# Patient Record
Sex: Female | Born: 1950
Health system: Southern US, Community
[De-identification: ages and names within clinical notes are randomized; demographics above are authoritative.]

## PROBLEM LIST (undated history)

## (undated) DIAGNOSIS — I82403 Acute embolism and thrombosis of unspecified deep veins of lower extremity, bilateral: Secondary | ICD-10-CM

## (undated) DIAGNOSIS — I82409 Acute embolism and thrombosis of unspecified deep veins of unspecified lower extremity: Secondary | ICD-10-CM

## (undated) DIAGNOSIS — M1990 Unspecified osteoarthritis, unspecified site: Secondary | ICD-10-CM

## (undated) DIAGNOSIS — R569 Unspecified convulsions: Secondary | ICD-10-CM

## (undated) DIAGNOSIS — G4733 Obstructive sleep apnea (adult) (pediatric): Secondary | ICD-10-CM

## (undated) DIAGNOSIS — F32A Depression, unspecified: Secondary | ICD-10-CM

## (undated) DIAGNOSIS — G43909 Migraine, unspecified, not intractable, without status migrainosus: Secondary | ICD-10-CM

## (undated) DIAGNOSIS — D649 Anemia, unspecified: Secondary | ICD-10-CM

## (undated) DIAGNOSIS — E669 Obesity, unspecified: Secondary | ICD-10-CM

## (undated) DIAGNOSIS — I2510 Atherosclerotic heart disease of native coronary artery without angina pectoris: Secondary | ICD-10-CM

## (undated) DIAGNOSIS — K219 Gastro-esophageal reflux disease without esophagitis: Secondary | ICD-10-CM

## (undated) DIAGNOSIS — I219 Acute myocardial infarction, unspecified: Secondary | ICD-10-CM

## (undated) DIAGNOSIS — M549 Dorsalgia, unspecified: Secondary | ICD-10-CM

## (undated) DIAGNOSIS — M546 Pain in thoracic spine: Secondary | ICD-10-CM

## (undated) DIAGNOSIS — I509 Heart failure, unspecified: Secondary | ICD-10-CM

## (undated) DIAGNOSIS — E785 Hyperlipidemia, unspecified: Secondary | ICD-10-CM

## (undated) DIAGNOSIS — F1010 Alcohol abuse, uncomplicated: Secondary | ICD-10-CM

## (undated) DIAGNOSIS — I10 Essential (primary) hypertension: Secondary | ICD-10-CM

## (undated) DIAGNOSIS — G8929 Other chronic pain: Secondary | ICD-10-CM

## (undated) DIAGNOSIS — J45901 Unspecified asthma with (acute) exacerbation: Secondary | ICD-10-CM

## (undated) DIAGNOSIS — N183 Chronic kidney disease, stage 3 (moderate): Secondary | ICD-10-CM

## (undated) DIAGNOSIS — F329 Major depressive disorder, single episode, unspecified: Secondary | ICD-10-CM

## (undated) HISTORY — PX: COLONOSCOPY: SHX174

## (undated) HISTORY — DX: Essential (primary) hypertension: I10

## (undated) HISTORY — PX: CARDIAC CATHETERIZATION: SHX172

## (undated) HISTORY — DX: Unspecified osteoarthritis, unspecified site: M19.90

## (undated) HISTORY — PX: TUBAL LIGATION: SHX77

## (undated) HISTORY — DX: Major depressive disorder, single episode, unspecified: F32.9

## (undated) HISTORY — DX: Atherosclerotic heart disease of native coronary artery without angina pectoris: I25.10

## (undated) HISTORY — DX: Obesity, unspecified: E66.9

## (undated) HISTORY — DX: Obstructive sleep apnea (adult) (pediatric): G47.33

## (undated) HISTORY — DX: Hyperlipidemia, unspecified: E78.5

## (undated) HISTORY — DX: Depression, unspecified: F32.A

## (undated) HISTORY — PX: KNEE ARTHROSCOPY: SUR90

## (undated) HISTORY — DX: Acute embolism and thrombosis of unspecified deep veins of lower extremity, bilateral: I82.403

## (undated) HISTORY — PX: ABDOMINAL HYSTERECTOMY: SHX81

## (undated) HISTORY — PX: JOINT REPLACEMENT: SHX530

---

## 2000-05-20 ENCOUNTER — Ambulatory Visit (HOSPITAL_COMMUNITY): Admission: RE | Admit: 2000-05-20 | Discharge: 2000-05-20 | Payer: Self-pay | Admitting: Family Medicine

## 2000-11-25 ENCOUNTER — Emergency Department (HOSPITAL_COMMUNITY): Admission: EM | Admit: 2000-11-25 | Discharge: 2000-11-25 | Payer: Self-pay | Admitting: Emergency Medicine

## 2001-07-16 ENCOUNTER — Inpatient Hospital Stay (HOSPITAL_COMMUNITY): Admission: EM | Admit: 2001-07-16 | Discharge: 2001-07-18 | Payer: Self-pay | Admitting: Cardiology

## 2001-07-16 ENCOUNTER — Encounter: Payer: Self-pay | Admitting: Emergency Medicine

## 2001-10-12 ENCOUNTER — Inpatient Hospital Stay (HOSPITAL_COMMUNITY): Admission: EM | Admit: 2001-10-12 | Discharge: 2001-10-15 | Payer: Self-pay | Admitting: Emergency Medicine

## 2001-10-12 ENCOUNTER — Encounter: Payer: Self-pay | Admitting: *Deleted

## 2001-10-12 ENCOUNTER — Encounter: Payer: Self-pay | Admitting: Cardiovascular Disease

## 2001-11-11 ENCOUNTER — Encounter: Payer: Self-pay | Admitting: Emergency Medicine

## 2001-11-11 ENCOUNTER — Inpatient Hospital Stay (HOSPITAL_COMMUNITY): Admission: EM | Admit: 2001-11-11 | Discharge: 2001-11-16 | Payer: Self-pay | Admitting: Emergency Medicine

## 2001-11-14 ENCOUNTER — Encounter: Payer: Self-pay | Admitting: Cardiology

## 2001-12-26 ENCOUNTER — Inpatient Hospital Stay (HOSPITAL_COMMUNITY): Admission: EM | Admit: 2001-12-26 | Discharge: 2001-12-28 | Payer: Self-pay | Admitting: Emergency Medicine

## 2001-12-26 ENCOUNTER — Encounter: Payer: Self-pay | Admitting: Emergency Medicine

## 2002-03-07 ENCOUNTER — Encounter: Payer: Self-pay | Admitting: Emergency Medicine

## 2002-03-08 ENCOUNTER — Inpatient Hospital Stay (HOSPITAL_COMMUNITY): Admission: EM | Admit: 2002-03-08 | Discharge: 2002-03-12 | Payer: Self-pay

## 2002-07-24 ENCOUNTER — Inpatient Hospital Stay (HOSPITAL_COMMUNITY): Admission: EM | Admit: 2002-07-24 | Discharge: 2002-07-28 | Payer: Self-pay | Admitting: Emergency Medicine

## 2002-07-24 ENCOUNTER — Encounter: Payer: Self-pay | Admitting: Emergency Medicine

## 2002-07-25 ENCOUNTER — Encounter: Payer: Self-pay | Admitting: Interventional Cardiology

## 2002-11-02 ENCOUNTER — Emergency Department (HOSPITAL_COMMUNITY): Admission: EM | Admit: 2002-11-02 | Discharge: 2002-11-03 | Payer: Self-pay | Admitting: Emergency Medicine

## 2002-11-03 ENCOUNTER — Encounter: Payer: Self-pay | Admitting: Emergency Medicine

## 2002-11-29 ENCOUNTER — Emergency Department (HOSPITAL_COMMUNITY): Admission: EM | Admit: 2002-11-29 | Discharge: 2002-11-30 | Payer: Self-pay | Admitting: Emergency Medicine

## 2003-02-28 ENCOUNTER — Emergency Department (HOSPITAL_COMMUNITY): Admission: EM | Admit: 2003-02-28 | Discharge: 2003-03-01 | Payer: Self-pay | Admitting: *Deleted

## 2003-03-31 ENCOUNTER — Emergency Department (HOSPITAL_COMMUNITY): Admission: EM | Admit: 2003-03-31 | Discharge: 2003-04-01 | Payer: Self-pay | Admitting: Emergency Medicine

## 2003-07-16 ENCOUNTER — Encounter: Admission: RE | Admit: 2003-07-16 | Discharge: 2003-07-16 | Payer: Self-pay | Admitting: Internal Medicine

## 2003-07-18 ENCOUNTER — Emergency Department (HOSPITAL_COMMUNITY): Admission: EM | Admit: 2003-07-18 | Discharge: 2003-07-19 | Payer: Self-pay | Admitting: Emergency Medicine

## 2003-08-04 ENCOUNTER — Encounter: Admission: RE | Admit: 2003-08-04 | Discharge: 2003-08-04 | Payer: Self-pay | Admitting: Internal Medicine

## 2003-08-12 ENCOUNTER — Encounter: Admission: RE | Admit: 2003-08-12 | Discharge: 2003-08-12 | Payer: Self-pay | Admitting: Internal Medicine

## 2003-09-12 ENCOUNTER — Emergency Department (HOSPITAL_COMMUNITY): Admission: EM | Admit: 2003-09-12 | Discharge: 2003-09-12 | Payer: Self-pay | Admitting: Emergency Medicine

## 2003-09-27 ENCOUNTER — Encounter: Admission: RE | Admit: 2003-09-27 | Discharge: 2003-09-27 | Payer: Self-pay | Admitting: Internal Medicine

## 2003-11-17 ENCOUNTER — Encounter (INDEPENDENT_AMBULATORY_CARE_PROVIDER_SITE_OTHER): Payer: Self-pay | Admitting: *Deleted

## 2003-11-17 ENCOUNTER — Ambulatory Visit: Payer: Self-pay | Admitting: Internal Medicine

## 2004-01-05 ENCOUNTER — Emergency Department (HOSPITAL_COMMUNITY): Admission: EM | Admit: 2004-01-05 | Discharge: 2004-01-05 | Payer: Self-pay | Admitting: Emergency Medicine

## 2004-05-04 ENCOUNTER — Emergency Department (HOSPITAL_COMMUNITY): Admission: EM | Admit: 2004-05-04 | Discharge: 2004-05-04 | Payer: Self-pay | Admitting: Emergency Medicine

## 2004-06-14 ENCOUNTER — Emergency Department (HOSPITAL_COMMUNITY): Admission: EM | Admit: 2004-06-14 | Discharge: 2004-06-14 | Payer: Self-pay | Admitting: Emergency Medicine

## 2004-10-19 ENCOUNTER — Ambulatory Visit (HOSPITAL_COMMUNITY): Admission: RE | Admit: 2004-10-19 | Discharge: 2004-10-19 | Payer: Self-pay | Admitting: Family Medicine

## 2004-10-24 ENCOUNTER — Ambulatory Visit: Payer: Self-pay | Admitting: Internal Medicine

## 2004-11-15 ENCOUNTER — Inpatient Hospital Stay (HOSPITAL_COMMUNITY): Admission: EM | Admit: 2004-11-15 | Discharge: 2004-11-18 | Payer: Self-pay | Admitting: Emergency Medicine

## 2004-11-15 ENCOUNTER — Ambulatory Visit: Payer: Self-pay | Admitting: Hospitalist

## 2004-11-15 ENCOUNTER — Ambulatory Visit: Payer: Self-pay | Admitting: Internal Medicine

## 2004-11-24 ENCOUNTER — Ambulatory Visit: Payer: Self-pay | Admitting: Internal Medicine

## 2005-02-08 ENCOUNTER — Ambulatory Visit: Payer: Self-pay | Admitting: Internal Medicine

## 2005-02-22 ENCOUNTER — Ambulatory Visit: Payer: Self-pay | Admitting: Internal Medicine

## 2005-03-05 ENCOUNTER — Ambulatory Visit: Payer: Self-pay | Admitting: Internal Medicine

## 2005-05-04 ENCOUNTER — Ambulatory Visit: Payer: Self-pay | Admitting: Internal Medicine

## 2005-05-18 ENCOUNTER — Ambulatory Visit: Payer: Self-pay | Admitting: Internal Medicine

## 2005-05-23 ENCOUNTER — Ambulatory Visit (HOSPITAL_BASED_OUTPATIENT_CLINIC_OR_DEPARTMENT_OTHER): Admission: RE | Admit: 2005-05-23 | Discharge: 2005-05-23 | Payer: Self-pay | Admitting: *Deleted

## 2005-05-23 ENCOUNTER — Encounter: Payer: Self-pay | Admitting: Internal Medicine

## 2005-05-27 ENCOUNTER — Ambulatory Visit: Payer: Self-pay | Admitting: Internal Medicine

## 2005-10-02 ENCOUNTER — Ambulatory Visit: Payer: Self-pay | Admitting: Hospitalist

## 2005-11-23 ENCOUNTER — Emergency Department (HOSPITAL_COMMUNITY): Admission: EM | Admit: 2005-11-23 | Discharge: 2005-11-23 | Payer: Self-pay | Admitting: Family Medicine

## 2005-11-28 ENCOUNTER — Ambulatory Visit: Payer: Self-pay | Admitting: Internal Medicine

## 2005-12-13 ENCOUNTER — Ambulatory Visit: Payer: Self-pay | Admitting: Internal Medicine

## 2005-12-13 ENCOUNTER — Encounter (INDEPENDENT_AMBULATORY_CARE_PROVIDER_SITE_OTHER): Payer: Self-pay | Admitting: *Deleted

## 2005-12-13 ENCOUNTER — Encounter (INDEPENDENT_AMBULATORY_CARE_PROVIDER_SITE_OTHER): Payer: Self-pay | Admitting: Internal Medicine

## 2005-12-13 DIAGNOSIS — G4733 Obstructive sleep apnea (adult) (pediatric): Secondary | ICD-10-CM

## 2005-12-13 DIAGNOSIS — R7303 Prediabetes: Secondary | ICD-10-CM

## 2005-12-13 DIAGNOSIS — F1011 Alcohol abuse, in remission: Secondary | ICD-10-CM | POA: Insufficient documentation

## 2005-12-13 DIAGNOSIS — I10 Essential (primary) hypertension: Secondary | ICD-10-CM | POA: Insufficient documentation

## 2005-12-13 DIAGNOSIS — F1010 Alcohol abuse, uncomplicated: Secondary | ICD-10-CM

## 2005-12-13 HISTORY — DX: Alcohol abuse, in remission: F10.11

## 2005-12-13 HISTORY — DX: Alcohol abuse, uncomplicated: F10.10

## 2005-12-13 LAB — CONVERTED CEMR LAB
BUN: 18 mg/dL (ref 6–23)
Calcium: 8.8 mg/dL (ref 8.4–10.5)
Glucose, Bld: 101 mg/dL — ABNORMAL HIGH (ref 70–99)
Magnesium: 1.7 mg/dL (ref 1.5–2.5)

## 2005-12-18 ENCOUNTER — Ambulatory Visit: Payer: Self-pay | Admitting: Gastroenterology

## 2005-12-31 ENCOUNTER — Encounter (INDEPENDENT_AMBULATORY_CARE_PROVIDER_SITE_OTHER): Payer: Self-pay | Admitting: *Deleted

## 2005-12-31 ENCOUNTER — Ambulatory Visit (HOSPITAL_COMMUNITY): Admission: RE | Admit: 2005-12-31 | Discharge: 2005-12-31 | Payer: Self-pay | Admitting: Family Medicine

## 2006-01-05 ENCOUNTER — Emergency Department (HOSPITAL_COMMUNITY): Admission: EM | Admit: 2006-01-05 | Discharge: 2006-01-05 | Payer: Self-pay | Admitting: Emergency Medicine

## 2006-02-05 ENCOUNTER — Ambulatory Visit: Payer: Self-pay | Admitting: Internal Medicine

## 2006-02-05 ENCOUNTER — Encounter (INDEPENDENT_AMBULATORY_CARE_PROVIDER_SITE_OTHER): Payer: Self-pay | Admitting: *Deleted

## 2006-02-05 DIAGNOSIS — I2510 Atherosclerotic heart disease of native coronary artery without angina pectoris: Secondary | ICD-10-CM | POA: Insufficient documentation

## 2006-02-05 LAB — CONVERTED CEMR LAB
ALT: 10 units/L (ref 0–35)
AST: 13 units/L (ref 0–37)
Alkaline Phosphatase: 79 units/L (ref 39–117)
Creatinine, Ser: 1.13 mg/dL (ref 0.40–1.20)
LDL Cholesterol: 78 mg/dL (ref 0–99)
Sodium: 137 meq/L (ref 135–145)
Total Bilirubin: 0.5 mg/dL (ref 0.3–1.2)
Total CHOL/HDL Ratio: 3.6
VLDL: 21 mg/dL (ref 0–40)

## 2006-04-21 ENCOUNTER — Emergency Department (HOSPITAL_COMMUNITY): Admission: EM | Admit: 2006-04-21 | Discharge: 2006-04-22 | Payer: Self-pay | Admitting: Emergency Medicine

## 2006-05-07 ENCOUNTER — Ambulatory Visit: Payer: Self-pay | Admitting: *Deleted

## 2006-05-07 ENCOUNTER — Ambulatory Visit (HOSPITAL_COMMUNITY): Admission: RE | Admit: 2006-05-07 | Discharge: 2006-05-07 | Payer: Self-pay | Admitting: *Deleted

## 2006-05-07 DIAGNOSIS — E785 Hyperlipidemia, unspecified: Secondary | ICD-10-CM | POA: Insufficient documentation

## 2006-07-08 ENCOUNTER — Ambulatory Visit: Payer: Self-pay | Admitting: *Deleted

## 2006-07-09 ENCOUNTER — Inpatient Hospital Stay (HOSPITAL_COMMUNITY): Admission: EM | Admit: 2006-07-09 | Discharge: 2006-07-11 | Payer: Self-pay | Admitting: *Deleted

## 2006-07-24 ENCOUNTER — Encounter (INDEPENDENT_AMBULATORY_CARE_PROVIDER_SITE_OTHER): Payer: Self-pay | Admitting: *Deleted

## 2006-07-24 ENCOUNTER — Ambulatory Visit: Payer: Self-pay | Admitting: Internal Medicine

## 2006-08-14 LAB — CONVERTED CEMR LAB: Helicobacter Pylori Antibody-IgG: 7.9 — ABNORMAL HIGH

## 2006-10-31 ENCOUNTER — Emergency Department (HOSPITAL_COMMUNITY): Admission: EM | Admit: 2006-10-31 | Discharge: 2006-10-31 | Payer: Self-pay | Admitting: Emergency Medicine

## 2007-01-08 ENCOUNTER — Ambulatory Visit: Payer: Self-pay | Admitting: Internal Medicine

## 2007-01-08 ENCOUNTER — Encounter (INDEPENDENT_AMBULATORY_CARE_PROVIDER_SITE_OTHER): Payer: Self-pay | Admitting: Internal Medicine

## 2007-01-08 LAB — CONVERTED CEMR LAB
AST: 23 units/L (ref 0–37)
Alkaline Phosphatase: 108 units/L (ref 39–117)
Glucose, Bld: 114 mg/dL — ABNORMAL HIGH (ref 70–99)
Sodium: 136 meq/L (ref 135–145)
TSH: 1.979 microintl units/mL (ref 0.350–5.50)
Total Bilirubin: 0.7 mg/dL (ref 0.3–1.2)
Total Protein: 8.3 g/dL (ref 6.0–8.3)

## 2007-01-22 ENCOUNTER — Ambulatory Visit: Payer: Self-pay | Admitting: Hospitalist

## 2007-02-14 ENCOUNTER — Emergency Department (HOSPITAL_COMMUNITY): Admission: EM | Admit: 2007-02-14 | Discharge: 2007-02-14 | Payer: Self-pay | Admitting: Emergency Medicine

## 2007-03-03 ENCOUNTER — Ambulatory Visit: Payer: Self-pay | Admitting: *Deleted

## 2007-03-03 LAB — CONVERTED CEMR LAB
CO2: 23 meq/L (ref 19–32)
Chloride: 104 meq/L (ref 96–112)
Creatinine, Ser: 1.24 mg/dL — ABNORMAL HIGH (ref 0.40–1.20)

## 2007-03-04 DIAGNOSIS — R51 Headache: Secondary | ICD-10-CM

## 2007-03-04 DIAGNOSIS — R519 Headache, unspecified: Secondary | ICD-10-CM | POA: Insufficient documentation

## 2007-04-17 ENCOUNTER — Ambulatory Visit: Payer: Self-pay | Admitting: Internal Medicine

## 2007-04-17 ENCOUNTER — Encounter (INDEPENDENT_AMBULATORY_CARE_PROVIDER_SITE_OTHER): Payer: Self-pay | Admitting: *Deleted

## 2007-04-22 ENCOUNTER — Emergency Department (HOSPITAL_COMMUNITY): Admission: EM | Admit: 2007-04-22 | Discharge: 2007-04-22 | Payer: Self-pay | Admitting: Emergency Medicine

## 2007-05-07 ENCOUNTER — Ambulatory Visit: Payer: Self-pay | Admitting: *Deleted

## 2007-05-07 LAB — CONVERTED CEMR LAB
BUN: 22 mg/dL (ref 6–23)
CO2: 24 meq/L (ref 19–32)
Chloride: 101 meq/L (ref 96–112)
Creatinine, Ser: 1.13 mg/dL (ref 0.40–1.20)

## 2007-05-20 ENCOUNTER — Ambulatory Visit: Payer: Self-pay | Admitting: *Deleted

## 2007-05-20 LAB — CONVERTED CEMR LAB
BUN: 16 mg/dL (ref 6–23)
Creatinine, Ser: 1.23 mg/dL — ABNORMAL HIGH (ref 0.40–1.20)
Potassium: 4.5 meq/L (ref 3.5–5.3)

## 2007-06-01 ENCOUNTER — Emergency Department (HOSPITAL_COMMUNITY): Admission: EM | Admit: 2007-06-01 | Discharge: 2007-06-01 | Payer: Self-pay | Admitting: Emergency Medicine

## 2007-06-04 ENCOUNTER — Ambulatory Visit: Payer: Self-pay | Admitting: Hospitalist

## 2007-06-04 ENCOUNTER — Encounter (INDEPENDENT_AMBULATORY_CARE_PROVIDER_SITE_OTHER): Payer: Self-pay | Admitting: Internal Medicine

## 2007-06-05 LAB — CONVERTED CEMR LAB
ALT: 16 units/L (ref 0–35)
AST: 16 units/L (ref 0–37)
Albumin: 4 g/dL (ref 3.5–5.2)
Alkaline Phosphatase: 96 units/L (ref 39–117)
LDL Cholesterol: 120 mg/dL — ABNORMAL HIGH (ref 0–99)
Microalb Creat Ratio: 323.9 mg/g — ABNORMAL HIGH (ref 0.0–30.0)
Microalb, Ur: 144 mg/dL — ABNORMAL HIGH (ref 0.00–1.89)
Potassium: 4.3 meq/L (ref 3.5–5.3)
Sodium: 140 meq/L (ref 135–145)
Total Protein: 8.2 g/dL (ref 6.0–8.3)

## 2007-06-10 ENCOUNTER — Ambulatory Visit: Payer: Self-pay | Admitting: Internal Medicine

## 2007-06-10 ENCOUNTER — Encounter (INDEPENDENT_AMBULATORY_CARE_PROVIDER_SITE_OTHER): Payer: Self-pay | Admitting: Internal Medicine

## 2007-06-10 LAB — CONVERTED CEMR LAB
Calcium: 9 mg/dL (ref 8.4–10.5)
Sodium: 139 meq/L (ref 135–145)

## 2007-06-17 ENCOUNTER — Ambulatory Visit (HOSPITAL_COMMUNITY): Admission: RE | Admit: 2007-06-17 | Discharge: 2007-06-17 | Payer: Self-pay | Admitting: Infectious Disease

## 2007-06-17 ENCOUNTER — Encounter (INDEPENDENT_AMBULATORY_CARE_PROVIDER_SITE_OTHER): Payer: Self-pay | Admitting: Internal Medicine

## 2007-06-17 ENCOUNTER — Ambulatory Visit: Payer: Self-pay | Admitting: Internal Medicine

## 2007-06-17 ENCOUNTER — Encounter: Payer: Self-pay | Admitting: Infectious Disease

## 2007-06-17 ENCOUNTER — Telehealth: Payer: Self-pay | Admitting: *Deleted

## 2007-06-17 LAB — CONVERTED CEMR LAB
CO2: 26 meq/L (ref 19–32)
Calcium: 9.3 mg/dL (ref 8.4–10.5)
Chloride: 103 meq/L (ref 96–112)
Sodium: 139 meq/L (ref 135–145)

## 2007-06-26 ENCOUNTER — Ambulatory Visit: Payer: Self-pay | Admitting: Internal Medicine

## 2007-07-10 ENCOUNTER — Encounter: Payer: Self-pay | Admitting: Internal Medicine

## 2007-07-10 ENCOUNTER — Ambulatory Visit: Payer: Self-pay | Admitting: *Deleted

## 2007-07-14 LAB — CONVERTED CEMR LAB
ALT: 12 units/L (ref 0–35)
Albumin: 4.5 g/dL (ref 3.5–5.2)
BUN: 21 mg/dL (ref 6–23)
CO2: 25 meq/L (ref 19–32)
Calcium: 9.1 mg/dL (ref 8.4–10.5)
Chloride: 102 meq/L (ref 96–112)
Cholesterol: 122 mg/dL (ref 0–200)
Creatinine, Ser: 1.57 mg/dL — ABNORMAL HIGH (ref 0.40–1.20)
Potassium: 4 meq/L (ref 3.5–5.3)
Total CHOL/HDL Ratio: 3.1

## 2007-07-16 ENCOUNTER — Encounter: Payer: Self-pay | Admitting: Internal Medicine

## 2007-07-16 ENCOUNTER — Ambulatory Visit: Payer: Self-pay | Admitting: *Deleted

## 2007-07-16 LAB — CONVERTED CEMR LAB
BUN: 13 mg/dL (ref 6–23)
CO2: 25 meq/L (ref 19–32)
Chloride: 104 meq/L (ref 96–112)
Glucose, Bld: 112 mg/dL — ABNORMAL HIGH (ref 70–99)
Potassium: 4.2 meq/L (ref 3.5–5.3)

## 2007-07-23 ENCOUNTER — Emergency Department (HOSPITAL_COMMUNITY): Admission: EM | Admit: 2007-07-23 | Discharge: 2007-07-23 | Payer: Self-pay | Admitting: Emergency Medicine

## 2007-07-29 ENCOUNTER — Ambulatory Visit (HOSPITAL_COMMUNITY): Admission: RE | Admit: 2007-07-29 | Discharge: 2007-07-29 | Payer: Self-pay | Admitting: *Deleted

## 2007-07-29 ENCOUNTER — Ambulatory Visit: Payer: Self-pay | Admitting: *Deleted

## 2007-07-29 LAB — CONVERTED CEMR LAB
BUN: 20 mg/dL (ref 6–23)
Calcium: 9.1 mg/dL (ref 8.4–10.5)
Creatinine, Ser: 1.79 mg/dL — ABNORMAL HIGH (ref 0.40–1.20)
Glucose, Bld: 118 mg/dL — ABNORMAL HIGH (ref 70–99)
Sodium: 140 meq/L (ref 135–145)

## 2007-08-06 ENCOUNTER — Encounter (INDEPENDENT_AMBULATORY_CARE_PROVIDER_SITE_OTHER): Payer: Self-pay | Admitting: *Deleted

## 2007-08-12 ENCOUNTER — Ambulatory Visit (HOSPITAL_COMMUNITY): Admission: RE | Admit: 2007-08-12 | Discharge: 2007-08-12 | Payer: Self-pay | Admitting: Orthopaedic Surgery

## 2007-08-18 ENCOUNTER — Telehealth (INDEPENDENT_AMBULATORY_CARE_PROVIDER_SITE_OTHER): Payer: Self-pay | Admitting: *Deleted

## 2007-08-29 ENCOUNTER — Emergency Department (HOSPITAL_COMMUNITY): Admission: EM | Admit: 2007-08-29 | Discharge: 2007-08-30 | Payer: Self-pay | Admitting: Emergency Medicine

## 2007-10-05 ENCOUNTER — Emergency Department (HOSPITAL_COMMUNITY): Admission: EM | Admit: 2007-10-05 | Discharge: 2007-10-05 | Payer: Self-pay | Admitting: Emergency Medicine

## 2007-10-13 ENCOUNTER — Ambulatory Visit: Payer: Self-pay | Admitting: *Deleted

## 2007-10-14 LAB — CONVERTED CEMR LAB
Calcium: 9 mg/dL (ref 8.4–10.5)
Cholesterol: 161 mg/dL (ref 0–200)
Creatinine, Ser: 1.25 mg/dL — ABNORMAL HIGH (ref 0.40–1.20)
HDL: 44 mg/dL (ref >39)
Sodium: 140 meq/L (ref 135–145)
Total CHOL/HDL Ratio: 3.7
Triglycerides: 63 mg/dL (ref <150)

## 2007-10-27 ENCOUNTER — Ambulatory Visit: Payer: Self-pay | Admitting: Infectious Diseases

## 2007-10-27 ENCOUNTER — Observation Stay (HOSPITAL_COMMUNITY): Admission: EM | Admit: 2007-10-27 | Discharge: 2007-10-28 | Payer: Self-pay | Admitting: Emergency Medicine

## 2007-10-28 ENCOUNTER — Encounter: Payer: Self-pay | Admitting: Infectious Diseases

## 2007-10-28 ENCOUNTER — Ambulatory Visit: Payer: Self-pay | Admitting: Surgery

## 2007-10-28 ENCOUNTER — Encounter (INDEPENDENT_AMBULATORY_CARE_PROVIDER_SITE_OTHER): Payer: Self-pay | Admitting: *Deleted

## 2007-10-29 ENCOUNTER — Encounter (INDEPENDENT_AMBULATORY_CARE_PROVIDER_SITE_OTHER): Payer: Self-pay | Admitting: Internal Medicine

## 2007-11-20 ENCOUNTER — Ambulatory Visit: Payer: Self-pay | Admitting: Internal Medicine

## 2007-11-20 ENCOUNTER — Encounter (INDEPENDENT_AMBULATORY_CARE_PROVIDER_SITE_OTHER): Payer: Self-pay | Admitting: Internal Medicine

## 2007-11-26 ENCOUNTER — Telehealth (INDEPENDENT_AMBULATORY_CARE_PROVIDER_SITE_OTHER): Payer: Self-pay | Admitting: Internal Medicine

## 2007-11-26 LAB — CONVERTED CEMR LAB
Calcium: 9 mg/dL (ref 8.4–10.5)
Creatinine, Ser: 1.21 mg/dL — ABNORMAL HIGH (ref 0.40–1.20)
HCT: 37.8 % (ref 36.0–46.0)
MCHC: 32.3 g/dL (ref 30.0–36.0)
MCV: 89.8 fL (ref 78.0–100.0)
Platelets: 255 10*3/uL (ref 150–400)
Sodium: 143 meq/L (ref 135–145)

## 2007-12-02 ENCOUNTER — Encounter (INDEPENDENT_AMBULATORY_CARE_PROVIDER_SITE_OTHER): Payer: Self-pay | Admitting: *Deleted

## 2007-12-02 ENCOUNTER — Encounter: Payer: Self-pay | Admitting: Licensed Clinical Social Worker

## 2007-12-02 ENCOUNTER — Ambulatory Visit: Payer: Self-pay | Admitting: Internal Medicine

## 2007-12-02 LAB — CONVERTED CEMR LAB
CO2: 24 meq/L (ref 19–32)
Calcium: 9.1 mg/dL (ref 8.4–10.5)
Chloride: 102 meq/L (ref 96–112)
Creatinine, Ser: 1.22 mg/dL — ABNORMAL HIGH (ref 0.40–1.20)
Sodium: 140 meq/L (ref 135–145)

## 2007-12-19 ENCOUNTER — Ambulatory Visit: Payer: Self-pay | Admitting: Internal Medicine

## 2008-02-09 ENCOUNTER — Inpatient Hospital Stay (HOSPITAL_COMMUNITY): Admission: EM | Admit: 2008-02-09 | Discharge: 2008-02-12 | Payer: Self-pay | Admitting: Emergency Medicine

## 2008-02-09 ENCOUNTER — Ambulatory Visit: Payer: Self-pay | Admitting: Internal Medicine

## 2008-02-25 ENCOUNTER — Encounter: Payer: Self-pay | Admitting: *Deleted

## 2008-03-01 ENCOUNTER — Telehealth (INDEPENDENT_AMBULATORY_CARE_PROVIDER_SITE_OTHER): Payer: Self-pay | Admitting: Pharmacy Technician

## 2008-03-03 ENCOUNTER — Ambulatory Visit: Payer: Self-pay | Admitting: Internal Medicine

## 2008-03-03 ENCOUNTER — Ambulatory Visit (HOSPITAL_COMMUNITY): Admission: RE | Admit: 2008-03-03 | Discharge: 2008-03-03 | Payer: Self-pay | Admitting: Internal Medicine

## 2008-03-03 ENCOUNTER — Encounter: Payer: Self-pay | Admitting: Internal Medicine

## 2008-03-03 LAB — CONVERTED CEMR LAB
BUN: 18 mg/dL (ref 6–23)
Chloride: 102 meq/L (ref 96–112)
Glucose, Bld: 102 mg/dL — ABNORMAL HIGH (ref 70–99)
Potassium: 3.7 meq/L (ref 3.5–5.3)
Sodium: 136 meq/L (ref 135–145)

## 2008-03-10 ENCOUNTER — Emergency Department (HOSPITAL_COMMUNITY): Admission: EM | Admit: 2008-03-10 | Discharge: 2008-03-10 | Payer: Self-pay | Admitting: Family Medicine

## 2008-03-17 ENCOUNTER — Ambulatory Visit: Payer: Self-pay | Admitting: Internal Medicine

## 2008-03-17 ENCOUNTER — Encounter (INDEPENDENT_AMBULATORY_CARE_PROVIDER_SITE_OTHER): Payer: Self-pay | Admitting: *Deleted

## 2008-03-17 ENCOUNTER — Telehealth (INDEPENDENT_AMBULATORY_CARE_PROVIDER_SITE_OTHER): Payer: Self-pay | Admitting: *Deleted

## 2008-03-17 ENCOUNTER — Encounter: Payer: Self-pay | Admitting: Internal Medicine

## 2008-03-19 LAB — CONVERTED CEMR LAB
Albumin: 3.9 g/dL (ref 3.5–5.2)
Alkaline Phosphatase: 111 units/L (ref 39–117)
BUN: 17 mg/dL (ref 6–23)
CO2: 23 meq/L (ref 19–32)
Glucose, Bld: 119 mg/dL — ABNORMAL HIGH (ref 70–99)
Potassium: 4.3 meq/L (ref 3.5–5.3)
Sodium: 138 meq/L (ref 135–145)
Total Protein: 8.1 g/dL (ref 6.0–8.3)

## 2008-04-16 ENCOUNTER — Emergency Department (HOSPITAL_COMMUNITY): Admission: EM | Admit: 2008-04-16 | Discharge: 2008-04-16 | Payer: Self-pay | Admitting: Emergency Medicine

## 2008-04-29 ENCOUNTER — Telehealth (INDEPENDENT_AMBULATORY_CARE_PROVIDER_SITE_OTHER): Payer: Self-pay | Admitting: Pharmacy Technician

## 2008-05-14 ENCOUNTER — Telehealth (INDEPENDENT_AMBULATORY_CARE_PROVIDER_SITE_OTHER): Payer: Self-pay | Admitting: Pharmacy Technician

## 2008-05-18 ENCOUNTER — Telehealth (INDEPENDENT_AMBULATORY_CARE_PROVIDER_SITE_OTHER): Payer: Self-pay | Admitting: *Deleted

## 2008-05-18 ENCOUNTER — Ambulatory Visit: Payer: Self-pay | Admitting: *Deleted

## 2008-05-18 LAB — CONVERTED CEMR LAB
Calcium: 8.8 mg/dL (ref 8.4–10.5)
Cholesterol: 111 mg/dL (ref 0–200)
HDL: 31 mg/dL — ABNORMAL LOW (ref >39)
Hgb A1c MFr Bld: 6 %
Potassium: 4.4 meq/L (ref 3.5–5.3)
Sodium: 139 meq/L (ref 135–145)
Total CHOL/HDL Ratio: 3.6
Triglycerides: 84 mg/dL (ref <150)
VLDL: 17 mg/dL (ref 0–40)

## 2008-06-02 ENCOUNTER — Telehealth (INDEPENDENT_AMBULATORY_CARE_PROVIDER_SITE_OTHER): Payer: Self-pay | Admitting: Pharmacy Technician

## 2008-06-12 ENCOUNTER — Ambulatory Visit: Payer: Self-pay | Admitting: *Deleted

## 2008-06-12 ENCOUNTER — Observation Stay (HOSPITAL_COMMUNITY): Admission: EM | Admit: 2008-06-12 | Discharge: 2008-06-12 | Payer: Self-pay | Admitting: Emergency Medicine

## 2008-06-12 ENCOUNTER — Encounter: Payer: Self-pay | Admitting: Internal Medicine

## 2008-06-18 ENCOUNTER — Emergency Department (HOSPITAL_COMMUNITY): Admission: EM | Admit: 2008-06-18 | Discharge: 2008-06-18 | Payer: Self-pay | Admitting: Emergency Medicine

## 2008-06-21 ENCOUNTER — Ambulatory Visit: Payer: Self-pay | Admitting: *Deleted

## 2008-06-21 ENCOUNTER — Inpatient Hospital Stay (HOSPITAL_COMMUNITY): Admission: EM | Admit: 2008-06-21 | Discharge: 2008-06-24 | Payer: Self-pay | Admitting: Family Medicine

## 2008-06-21 ENCOUNTER — Encounter (INDEPENDENT_AMBULATORY_CARE_PROVIDER_SITE_OTHER): Payer: Self-pay | Admitting: *Deleted

## 2008-06-23 ENCOUNTER — Encounter (INDEPENDENT_AMBULATORY_CARE_PROVIDER_SITE_OTHER): Payer: Self-pay | Admitting: *Deleted

## 2008-07-05 ENCOUNTER — Ambulatory Visit: Payer: Self-pay | Admitting: *Deleted

## 2008-07-05 ENCOUNTER — Telehealth (INDEPENDENT_AMBULATORY_CARE_PROVIDER_SITE_OTHER): Payer: Self-pay | Admitting: Pharmacy Technician

## 2008-07-05 DIAGNOSIS — M109 Gout, unspecified: Secondary | ICD-10-CM

## 2008-07-12 ENCOUNTER — Encounter (INDEPENDENT_AMBULATORY_CARE_PROVIDER_SITE_OTHER): Payer: Self-pay | Admitting: *Deleted

## 2008-07-19 ENCOUNTER — Encounter (INDEPENDENT_AMBULATORY_CARE_PROVIDER_SITE_OTHER): Payer: Self-pay | Admitting: *Deleted

## 2008-07-19 ENCOUNTER — Ambulatory Visit: Payer: Self-pay | Admitting: Internal Medicine

## 2008-07-19 ENCOUNTER — Ambulatory Visit (HOSPITAL_COMMUNITY): Admission: RE | Admit: 2008-07-19 | Discharge: 2008-07-19 | Payer: Self-pay | Admitting: Internal Medicine

## 2008-07-19 ENCOUNTER — Ambulatory Visit (HOSPITAL_COMMUNITY): Admission: RE | Admit: 2008-07-19 | Discharge: 2008-07-19 | Payer: Self-pay | Admitting: *Deleted

## 2008-07-19 LAB — CONVERTED CEMR LAB: LDL Goal: 100 mg/dL

## 2008-07-20 LAB — CONVERTED CEMR LAB
AST: 21 units/L (ref 0–37)
Albumin: 4.4 g/dL (ref 3.5–5.2)
BUN: 22 mg/dL (ref 6–23)
Basophils Relative: 0 % (ref 0–1)
CO2: 24 meq/L (ref 19–32)
Calcium: 9.5 mg/dL (ref 8.4–10.5)
Chloride: 104 meq/L (ref 96–112)
Creatinine, Ser: 1.49 mg/dL — ABNORMAL HIGH (ref 0.40–1.20)
Eosinophils Absolute: 0.3 10*3/uL (ref 0.0–0.7)
GFR calc Af Amer: 44 mL/min — ABNORMAL LOW (ref >60)
Glucose, Bld: 121 mg/dL — ABNORMAL HIGH (ref 70–99)
Lymphs Abs: 2 10*3/uL (ref 0.7–4.0)
MCV: 88.7 fL (ref 78.0–100.0)
Neutro Abs: 2.5 10*3/uL (ref 1.7–7.7)
Neutrophils Relative %: 48 % (ref 43–77)
Platelets: 199 10*3/uL (ref 150–400)
Potassium: 3.8 meq/L (ref 3.5–5.3)
Sed Rate: 53 mm/hr — ABNORMAL HIGH (ref 0–22)
WBC: 5.3 10*3/uL (ref 4.0–10.5)

## 2008-07-27 ENCOUNTER — Encounter (INDEPENDENT_AMBULATORY_CARE_PROVIDER_SITE_OTHER): Payer: Self-pay | Admitting: *Deleted

## 2008-07-30 ENCOUNTER — Encounter: Payer: Self-pay | Admitting: Internal Medicine

## 2008-07-30 ENCOUNTER — Encounter (INDEPENDENT_AMBULATORY_CARE_PROVIDER_SITE_OTHER): Payer: Self-pay | Admitting: *Deleted

## 2008-08-02 ENCOUNTER — Ambulatory Visit: Payer: Self-pay | Admitting: Internal Medicine

## 2008-08-02 ENCOUNTER — Encounter (INDEPENDENT_AMBULATORY_CARE_PROVIDER_SITE_OTHER): Payer: Self-pay | Admitting: *Deleted

## 2008-08-12 ENCOUNTER — Telehealth: Payer: Self-pay | Admitting: *Deleted

## 2008-08-17 ENCOUNTER — Encounter (INDEPENDENT_AMBULATORY_CARE_PROVIDER_SITE_OTHER): Payer: Self-pay | Admitting: *Deleted

## 2008-08-18 ENCOUNTER — Observation Stay (HOSPITAL_COMMUNITY): Admission: EM | Admit: 2008-08-18 | Discharge: 2008-08-20 | Payer: Self-pay | Admitting: Emergency Medicine

## 2008-08-27 ENCOUNTER — Encounter: Admission: RE | Admit: 2008-08-27 | Discharge: 2008-08-27 | Payer: Self-pay | Admitting: Sports Medicine

## 2008-09-08 ENCOUNTER — Encounter: Payer: Self-pay | Admitting: Internal Medicine

## 2008-09-08 ENCOUNTER — Ambulatory Visit: Payer: Self-pay | Admitting: Infectious Diseases

## 2008-09-08 DIAGNOSIS — F331 Major depressive disorder, recurrent, moderate: Secondary | ICD-10-CM | POA: Insufficient documentation

## 2008-09-08 LAB — CONVERTED CEMR LAB
BUN: 19 mg/dL (ref 6–23)
Creatinine, Ser: 1.2 mg/dL (ref 0.40–1.20)
Lymphs Abs: 1.7 10*3/uL (ref 0.7–4.0)
Monocytes Relative: 6 % (ref 3–12)
Neutro Abs: 3.3 10*3/uL (ref 1.7–7.7)
Neutrophils Relative %: 59 % (ref 43–77)
Potassium: 4.2 meq/L (ref 3.5–5.3)
RBC: 4.31 M/uL (ref 3.87–5.11)
Sed Rate: 72 mm/hr — ABNORMAL HIGH (ref 0–22)
WBC: 5.5 10*3/uL (ref 4.0–10.5)

## 2008-09-13 ENCOUNTER — Ambulatory Visit (HOSPITAL_COMMUNITY): Admission: RE | Admit: 2008-09-13 | Discharge: 2008-09-13 | Payer: Self-pay | Admitting: Infectious Diseases

## 2008-09-13 ENCOUNTER — Encounter: Payer: Self-pay | Admitting: Internal Medicine

## 2008-09-13 ENCOUNTER — Ambulatory Visit: Payer: Self-pay | Admitting: Infectious Diseases

## 2008-09-15 ENCOUNTER — Telehealth: Payer: Self-pay | Admitting: Internal Medicine

## 2008-09-17 ENCOUNTER — Encounter: Payer: Self-pay | Admitting: Internal Medicine

## 2008-09-17 ENCOUNTER — Ambulatory Visit: Payer: Self-pay | Admitting: Infectious Diseases

## 2008-09-22 ENCOUNTER — Encounter: Payer: Self-pay | Admitting: Internal Medicine

## 2008-09-29 ENCOUNTER — Encounter: Payer: Self-pay | Admitting: Internal Medicine

## 2008-09-29 ENCOUNTER — Ambulatory Visit: Payer: Self-pay | Admitting: Internal Medicine

## 2008-10-02 ENCOUNTER — Ambulatory Visit (HOSPITAL_COMMUNITY): Admission: RE | Admit: 2008-10-02 | Discharge: 2008-10-02 | Payer: Self-pay | Admitting: Internal Medicine

## 2008-10-13 ENCOUNTER — Ambulatory Visit: Payer: Self-pay | Admitting: Internal Medicine

## 2008-10-13 ENCOUNTER — Encounter (INDEPENDENT_AMBULATORY_CARE_PROVIDER_SITE_OTHER): Payer: Self-pay | Admitting: Internal Medicine

## 2008-10-15 ENCOUNTER — Ambulatory Visit: Payer: Self-pay | Admitting: Family Medicine

## 2008-10-15 ENCOUNTER — Encounter (INDEPENDENT_AMBULATORY_CARE_PROVIDER_SITE_OTHER): Payer: Self-pay | Admitting: *Deleted

## 2008-10-27 ENCOUNTER — Telehealth: Payer: Self-pay | Admitting: *Deleted

## 2008-11-05 ENCOUNTER — Ambulatory Visit: Payer: Self-pay | Admitting: Sports Medicine

## 2008-11-05 ENCOUNTER — Encounter: Payer: Self-pay | Admitting: Family Medicine

## 2008-11-11 ENCOUNTER — Ambulatory Visit: Payer: Self-pay | Admitting: Infectious Diseases

## 2008-11-11 ENCOUNTER — Encounter (INDEPENDENT_AMBULATORY_CARE_PROVIDER_SITE_OTHER): Payer: Self-pay | Admitting: Internal Medicine

## 2008-11-11 DIAGNOSIS — Z9181 History of falling: Secondary | ICD-10-CM | POA: Insufficient documentation

## 2008-11-11 LAB — CONVERTED CEMR LAB
Chloride: 102 meq/L (ref 96–112)
Creatinine, Ser: 1.16 mg/dL (ref 0.40–1.20)
Potassium: 4.5 meq/L (ref 3.5–5.3)
Sodium: 140 meq/L (ref 135–145)

## 2008-11-15 ENCOUNTER — Telehealth (INDEPENDENT_AMBULATORY_CARE_PROVIDER_SITE_OTHER): Payer: Self-pay | Admitting: Internal Medicine

## 2008-11-17 ENCOUNTER — Ambulatory Visit (HOSPITAL_COMMUNITY): Admission: RE | Admit: 2008-11-17 | Discharge: 2008-11-17 | Payer: Self-pay | Admitting: Internal Medicine

## 2008-11-24 ENCOUNTER — Encounter: Payer: Self-pay | Admitting: Internal Medicine

## 2008-11-24 ENCOUNTER — Ambulatory Visit: Payer: Self-pay | Admitting: Infectious Diseases

## 2008-11-27 ENCOUNTER — Encounter: Payer: Self-pay | Admitting: Internal Medicine

## 2008-11-28 ENCOUNTER — Inpatient Hospital Stay (HOSPITAL_COMMUNITY): Admission: EM | Admit: 2008-11-28 | Discharge: 2008-11-29 | Payer: Self-pay | Admitting: Emergency Medicine

## 2008-11-28 ENCOUNTER — Ambulatory Visit: Payer: Self-pay | Admitting: Internal Medicine

## 2008-11-29 ENCOUNTER — Encounter: Payer: Self-pay | Admitting: Internal Medicine

## 2008-12-06 ENCOUNTER — Encounter: Payer: Self-pay | Admitting: Internal Medicine

## 2008-12-21 ENCOUNTER — Ambulatory Visit: Payer: Self-pay | Admitting: Internal Medicine

## 2008-12-21 DIAGNOSIS — M2550 Pain in unspecified joint: Secondary | ICD-10-CM | POA: Insufficient documentation

## 2008-12-22 ENCOUNTER — Encounter (INDEPENDENT_AMBULATORY_CARE_PROVIDER_SITE_OTHER): Payer: Self-pay | Admitting: Internal Medicine

## 2008-12-22 LAB — CONVERTED CEMR LAB
CO2: 23 meq/L (ref 19–32)
Chloride: 102 meq/L (ref 96–112)
Glucose, Bld: 95 mg/dL (ref 70–99)
Potassium: 4.3 meq/L (ref 3.5–5.3)
Sodium: 140 meq/L (ref 135–145)
Total CK: 125 units/L (ref 7–177)

## 2008-12-23 ENCOUNTER — Telehealth: Payer: Self-pay | Admitting: *Deleted

## 2009-01-31 ENCOUNTER — Encounter: Payer: Self-pay | Admitting: Internal Medicine

## 2009-02-02 ENCOUNTER — Encounter: Payer: Self-pay | Admitting: Licensed Clinical Social Worker

## 2009-02-02 ENCOUNTER — Telehealth: Payer: Self-pay | Admitting: Internal Medicine

## 2009-02-02 ENCOUNTER — Ambulatory Visit: Payer: Self-pay | Admitting: Internal Medicine

## 2009-02-09 ENCOUNTER — Encounter: Payer: Self-pay | Admitting: Internal Medicine

## 2009-02-14 ENCOUNTER — Telehealth: Payer: Self-pay | Admitting: Internal Medicine

## 2009-03-03 ENCOUNTER — Emergency Department (HOSPITAL_COMMUNITY): Admission: EM | Admit: 2009-03-03 | Discharge: 2009-03-03 | Payer: Self-pay | Admitting: Emergency Medicine

## 2009-03-04 ENCOUNTER — Telehealth: Payer: Self-pay | Admitting: Internal Medicine

## 2009-03-14 ENCOUNTER — Ambulatory Visit: Payer: Self-pay | Admitting: Internal Medicine

## 2009-03-17 ENCOUNTER — Inpatient Hospital Stay (HOSPITAL_COMMUNITY): Admission: EM | Admit: 2009-03-17 | Discharge: 2009-03-22 | Payer: Self-pay | Admitting: Emergency Medicine

## 2009-03-17 ENCOUNTER — Ambulatory Visit: Payer: Self-pay | Admitting: Infectious Diseases

## 2009-03-17 ENCOUNTER — Encounter (INDEPENDENT_AMBULATORY_CARE_PROVIDER_SITE_OTHER): Payer: Self-pay | Admitting: Internal Medicine

## 2009-04-12 ENCOUNTER — Ambulatory Visit: Payer: Self-pay | Admitting: Internal Medicine

## 2009-04-14 ENCOUNTER — Ambulatory Visit: Payer: Self-pay | Admitting: Internal Medicine

## 2009-04-14 ENCOUNTER — Encounter: Payer: Self-pay | Admitting: Internal Medicine

## 2009-04-14 ENCOUNTER — Ambulatory Visit (HOSPITAL_COMMUNITY): Admission: RE | Admit: 2009-04-14 | Discharge: 2009-04-14 | Payer: Self-pay | Admitting: Internal Medicine

## 2009-04-14 LAB — CONVERTED CEMR LAB
Albumin: 3.2 g/dL — ABNORMAL LOW (ref 3.5–5.2)
Basophils Relative: 1 % (ref 0–1)
CO2: 29 meq/L (ref 19–32)
Calcium: 8.7 mg/dL (ref 8.4–10.5)
Glucose, Bld: 124 mg/dL — ABNORMAL HIGH (ref 70–99)
Hemoglobin: 11.6 g/dL — ABNORMAL LOW (ref 12.0–15.0)
MCHC: 33.4 g/dL (ref 30.0–36.0)
Monocytes Absolute: 0.4 10*3/uL (ref 0.1–1.0)
Monocytes Relative: 8 % (ref 3–12)
Neutro Abs: 3.4 10*3/uL (ref 1.7–7.7)
Potassium: 4.6 meq/L (ref 3.5–5.3)
RBC: 3.82 M/uL — ABNORMAL LOW (ref 3.87–5.11)
Sodium: 135 meq/L (ref 135–145)
Total Bilirubin: 0.8 mg/dL (ref 0.3–1.2)
Total Protein: 8.4 g/dL — ABNORMAL HIGH (ref 6.0–8.3)
Troponin I: 0.02 ng/mL (ref <0.06)

## 2009-04-22 ENCOUNTER — Ambulatory Visit: Payer: Self-pay | Admitting: Internal Medicine

## 2009-04-27 LAB — CONVERTED CEMR LAB
Calcium: 8.9 mg/dL (ref 8.4–10.5)
Sodium: 136 meq/L (ref 135–145)

## 2009-05-03 ENCOUNTER — Ambulatory Visit: Payer: Self-pay | Admitting: Internal Medicine

## 2009-05-03 ENCOUNTER — Encounter: Payer: Self-pay | Admitting: Internal Medicine

## 2009-05-03 ENCOUNTER — Observation Stay (HOSPITAL_COMMUNITY): Admission: EM | Admit: 2009-05-03 | Discharge: 2009-05-05 | Payer: Self-pay | Admitting: Emergency Medicine

## 2009-05-04 ENCOUNTER — Encounter: Payer: Self-pay | Admitting: Internal Medicine

## 2009-05-17 ENCOUNTER — Encounter: Payer: Self-pay | Admitting: Internal Medicine

## 2009-05-19 ENCOUNTER — Encounter: Payer: Self-pay | Admitting: Family Medicine

## 2009-05-31 ENCOUNTER — Ambulatory Visit: Payer: Self-pay | Admitting: Internal Medicine

## 2009-05-31 LAB — CONVERTED CEMR LAB: Hgb A1c MFr Bld: 5.7 %

## 2009-07-18 ENCOUNTER — Encounter: Payer: Self-pay | Admitting: Internal Medicine

## 2009-07-18 ENCOUNTER — Emergency Department (HOSPITAL_COMMUNITY): Admission: EM | Admit: 2009-07-18 | Discharge: 2009-07-19 | Payer: Self-pay | Admitting: Emergency Medicine

## 2009-07-27 ENCOUNTER — Ambulatory Visit: Payer: Self-pay | Admitting: Internal Medicine

## 2009-07-29 ENCOUNTER — Ambulatory Visit: Payer: Self-pay | Admitting: Internal Medicine

## 2009-08-01 ENCOUNTER — Encounter: Payer: Self-pay | Admitting: Internal Medicine

## 2009-08-01 DIAGNOSIS — R748 Abnormal levels of other serum enzymes: Secondary | ICD-10-CM

## 2009-08-01 LAB — CONVERTED CEMR LAB
ALT: 9 units/L (ref 0–35)
AST: 15 units/L (ref 0–37)
Albumin: 3.9 g/dL (ref 3.5–5.2)
Calcium: 9.1 mg/dL (ref 8.4–10.5)
Chloride: 105 meq/L (ref 96–112)
HDL: 32 mg/dL — ABNORMAL LOW (ref >39)
Potassium: 4.3 meq/L (ref 3.5–5.3)
Sodium: 139 meq/L (ref 135–145)
Total CHOL/HDL Ratio: 4.3
VLDL: 17 mg/dL (ref 0–40)

## 2009-08-19 ENCOUNTER — Emergency Department (HOSPITAL_COMMUNITY): Admission: EM | Admit: 2009-08-19 | Discharge: 2009-08-19 | Payer: Self-pay | Admitting: Emergency Medicine

## 2009-08-23 ENCOUNTER — Ambulatory Visit: Payer: Self-pay | Admitting: Internal Medicine

## 2009-08-23 ENCOUNTER — Ambulatory Visit: Payer: Self-pay | Admitting: Psychiatry

## 2009-08-25 ENCOUNTER — Encounter (HOSPITAL_COMMUNITY): Payer: Self-pay | Admitting: Psychiatry

## 2009-08-31 ENCOUNTER — Telehealth: Payer: Self-pay | Admitting: Licensed Clinical Social Worker

## 2009-09-05 ENCOUNTER — Telehealth: Payer: Self-pay | Admitting: Internal Medicine

## 2009-09-08 ENCOUNTER — Emergency Department (HOSPITAL_COMMUNITY): Admission: EM | Admit: 2009-09-08 | Discharge: 2009-09-08 | Payer: Self-pay | Admitting: Emergency Medicine

## 2009-09-27 ENCOUNTER — Ambulatory Visit (HOSPITAL_COMMUNITY): Admission: RE | Admit: 2009-09-27 | Discharge: 2009-09-27 | Payer: Self-pay | Admitting: Internal Medicine

## 2009-09-27 ENCOUNTER — Encounter: Payer: Self-pay | Admitting: Internal Medicine

## 2009-09-27 ENCOUNTER — Ambulatory Visit: Payer: Self-pay | Admitting: Internal Medicine

## 2009-09-27 DIAGNOSIS — J45909 Unspecified asthma, uncomplicated: Secondary | ICD-10-CM | POA: Insufficient documentation

## 2009-09-27 DIAGNOSIS — R609 Edema, unspecified: Secondary | ICD-10-CM

## 2009-09-27 DIAGNOSIS — J45901 Unspecified asthma with (acute) exacerbation: Secondary | ICD-10-CM

## 2009-09-27 HISTORY — DX: Unspecified asthma with (acute) exacerbation: J45.901

## 2009-09-27 LAB — CONVERTED CEMR LAB
Casts: NONE SEEN /lpf
Crystals: NONE SEEN
Ketones, ur: NEGATIVE mg/dL
Leukocytes, UA: NEGATIVE
Microalb Creat Ratio: 79.7 mg/g — ABNORMAL HIGH (ref 0.0–30.0)
Nitrite: NEGATIVE
Specific Gravity, Urine: 1.025 (ref 1.005–1.0)
Urine Glucose: NEGATIVE mg/dL
pH: 6 (ref 5.0–8.0)

## 2009-09-28 ENCOUNTER — Telehealth: Payer: Self-pay | Admitting: Internal Medicine

## 2009-09-29 ENCOUNTER — Ambulatory Visit (HOSPITAL_COMMUNITY): Admission: RE | Admit: 2009-09-29 | Discharge: 2009-09-29 | Payer: Self-pay | Admitting: Internal Medicine

## 2009-10-03 ENCOUNTER — Encounter: Payer: Self-pay | Admitting: Internal Medicine

## 2009-10-05 ENCOUNTER — Telehealth: Payer: Self-pay | Admitting: Internal Medicine

## 2009-10-06 ENCOUNTER — Encounter: Payer: Self-pay | Admitting: Internal Medicine

## 2009-10-06 ENCOUNTER — Ambulatory Visit (HOSPITAL_COMMUNITY): Admission: RE | Admit: 2009-10-06 | Discharge: 2009-10-06 | Payer: Self-pay | Admitting: Internal Medicine

## 2009-10-11 ENCOUNTER — Ambulatory Visit: Payer: Self-pay | Admitting: Internal Medicine

## 2009-10-23 ENCOUNTER — Encounter: Payer: Self-pay | Admitting: Internal Medicine

## 2009-11-07 ENCOUNTER — Encounter: Payer: Self-pay | Admitting: Internal Medicine

## 2009-11-28 ENCOUNTER — Ambulatory Visit (HOSPITAL_BASED_OUTPATIENT_CLINIC_OR_DEPARTMENT_OTHER): Admission: RE | Admit: 2009-11-28 | Discharge: 2009-11-28 | Payer: Self-pay | Admitting: Internal Medicine

## 2009-12-09 ENCOUNTER — Emergency Department (HOSPITAL_COMMUNITY): Admission: EM | Admit: 2009-12-09 | Discharge: 2009-12-09 | Payer: Self-pay | Admitting: Emergency Medicine

## 2009-12-14 ENCOUNTER — Telehealth: Payer: Self-pay | Admitting: *Deleted

## 2009-12-15 ENCOUNTER — Emergency Department (HOSPITAL_COMMUNITY): Admission: EM | Admit: 2009-12-15 | Discharge: 2009-12-15 | Payer: Self-pay | Admitting: Family Medicine

## 2009-12-25 ENCOUNTER — Ambulatory Visit: Payer: Self-pay | Admitting: Internal Medicine

## 2009-12-29 ENCOUNTER — Encounter: Payer: Self-pay | Admitting: Internal Medicine

## 2010-01-04 ENCOUNTER — Encounter: Payer: Self-pay | Admitting: Internal Medicine

## 2010-01-09 ENCOUNTER — Encounter: Payer: Self-pay | Admitting: Internal Medicine

## 2010-01-10 ENCOUNTER — Ambulatory Visit: Payer: Self-pay | Admitting: Internal Medicine

## 2010-01-16 ENCOUNTER — Encounter: Payer: Self-pay | Admitting: Internal Medicine

## 2010-01-16 LAB — CONVERTED CEMR LAB
BUN: 26 mg/dL — ABNORMAL HIGH (ref 6–23)
CO2: 27 meq/L (ref 19–32)
Chloride: 98 meq/L (ref 96–112)
Creatinine, Ser: 2.11 mg/dL — ABNORMAL HIGH (ref 0.40–1.20)

## 2010-01-17 ENCOUNTER — Encounter: Payer: Self-pay | Admitting: Internal Medicine

## 2010-01-28 ENCOUNTER — Emergency Department (HOSPITAL_COMMUNITY)
Admission: EM | Admit: 2010-01-28 | Discharge: 2010-01-28 | Payer: Self-pay | Source: Home / Self Care | Admitting: Emergency Medicine

## 2010-01-30 ENCOUNTER — Telehealth: Payer: Self-pay | Admitting: *Deleted

## 2010-02-02 ENCOUNTER — Inpatient Hospital Stay (HOSPITAL_COMMUNITY): Admission: AD | Admit: 2010-02-02 | Discharge: 2009-08-28 | Payer: Self-pay | Admitting: Psychiatry

## 2010-02-03 ENCOUNTER — Ambulatory Visit: Payer: Self-pay | Admitting: Internal Medicine

## 2010-02-03 ENCOUNTER — Encounter: Payer: Self-pay | Admitting: Internal Medicine

## 2010-02-03 DIAGNOSIS — R102 Pelvic and perineal pain: Secondary | ICD-10-CM

## 2010-02-03 DIAGNOSIS — G8929 Other chronic pain: Secondary | ICD-10-CM

## 2010-02-06 ENCOUNTER — Ambulatory Visit (HOSPITAL_COMMUNITY)
Admission: RE | Admit: 2010-02-06 | Discharge: 2010-02-06 | Payer: Self-pay | Source: Home / Self Care | Attending: Internal Medicine | Admitting: Internal Medicine

## 2010-02-08 ENCOUNTER — Encounter: Payer: Self-pay | Admitting: Internal Medicine

## 2010-02-10 ENCOUNTER — Telehealth: Payer: Self-pay | Admitting: *Deleted

## 2010-02-15 ENCOUNTER — Telehealth: Payer: Self-pay | Admitting: Internal Medicine

## 2010-02-21 ENCOUNTER — Emergency Department (HOSPITAL_COMMUNITY)
Admission: EM | Admit: 2010-02-21 | Discharge: 2010-02-21 | Payer: Self-pay | Source: Home / Self Care | Admitting: Emergency Medicine

## 2010-02-23 ENCOUNTER — Ambulatory Visit: Payer: Self-pay | Admitting: Obstetrics and Gynecology

## 2010-02-23 ENCOUNTER — Encounter (INDEPENDENT_AMBULATORY_CARE_PROVIDER_SITE_OTHER): Payer: Self-pay | Admitting: *Deleted

## 2010-02-23 LAB — CONVERTED CEMR LAB: Yeast Wet Prep HPF POC: NONE SEEN

## 2010-03-09 ENCOUNTER — Encounter: Payer: Self-pay | Admitting: Internal Medicine

## 2010-03-15 ENCOUNTER — Telehealth: Payer: Self-pay | Admitting: Internal Medicine

## 2010-03-20 ENCOUNTER — Encounter: Payer: Self-pay | Admitting: *Deleted

## 2010-03-20 ENCOUNTER — Encounter: Payer: Self-pay | Admitting: Cardiology

## 2010-03-28 NOTE — Progress Notes (Signed)
Summary: phone/gg  Phone Note Call from Patient   Caller: Patient Summary of Call: Pt c/o vaginal burning, frequency, and dysuria.  Onset last night.  Denies fever. Unable to see pt in clinic  tomorrow.  advised pt to go to [facility] tonight for evaluation. I will call pt tomorrow for f/u Initial call taken by: Merrie Roof RN,  December 14, 2009 4:33 PM  Follow-up for Phone Call        That's fine.  Thanks Follow-up by: Mariea Stable MD,  December 14, 2009 4:38 PM  Additional Follow-up for Phone Call Additional follow up Details #1::        Pt called and no answer, message left. Merrie Roof RN  December 15, 2009 9:26 AM   Pt returned call and was seen today at [facility] Additional Follow-up by: Merrie Roof RN,  December 15, 2009 12:05 PM

## 2010-03-28 NOTE — Letter (Signed)
Summary: CORRECTED [facility] STORE FORM/CMN  CORRECTED [facility] STORE FORM/CMN   Imported By: Shon Hough 01/24/2010 11:45:03  _____________________________________________________________________  External Attachment:    Type:   Image     Comment:   External Document

## 2010-03-28 NOTE — Assessment & Plan Note (Signed)
Summary: EST-CK/FU/MEDS/CFB   Vital Signs:  Patient profile:   60 year old female Height:      62 inches (157.48 cm) Weight:      296.8 pounds BMI:     54.72 Temp:     97.9 degrees F oral Pulse rate:   67 / minute BP sitting:   125 / 80  Vitals Entered By: Theotis Barrio NT II (May 31, 2009 3:55 PM)  Nutrition Counseling: Patient's BMI is greater than 25 and therefore counseled on weight management options. CC: ROUTINE OFFICE VISIT /  MEDICATION REFILL - CELEXA Is Patient Diabetic? No Pain Assessment Patient in pain? yes     Location: back Intensity:     8 Type: SHFARP/STINGING Onset of pain  Chronic Nutritional Status BMI of > 30 = obese  Have you ever been in a relationship where you felt threatened, hurt or afraid?No   Does patient need assistance? Functional Status Self care Ambulation Normal Comments ROUTINE OFFICE VISIT   Primary Care Provider:  [name]  CC:  ROUTINE OFFICE VISIT /  MEDICATION REFILL - CELEXA.  History of Present Illness: F/u on hospitalization: 1. CP --no new episodes. 2. Change of SSRI b/c of formulary issues. 3. C/o increased thirst. Denies any weight changes; polyphagia, polyuria. FMX of DM. Hx of hyperglycemia.  Preventive Screening-Counseling & Management  Alcohol-Tobacco     Alcohol drinks/day: 0     Alcohol type: beer,liquor     Smoking Status: never  Caffeine-Diet-Exercise     Does Patient Exercise: no  Problems Prior to Update: 1)  Sinusitis, Acute  (ICD-461.9) 2)  Dizziness  (ICD-780.4) 3)  Wheezing  (ICD-786.07) 4)  Pain in Joint, Multiple Sites  (ICD-719.49) 5)  Fall, Hx of  (ICD-V15.88) 6)  Osteoarthritis, Sacroiliac Joint  (ICD-715.98) 7)  Tinea Pedis  (ICD-110.4) 8)  Back Pain  (ICD-724.5) 9)  Back Pain  (ICD-724.5) 10)  Hemoccult Positive Stool  (ICD-578.1) 11)  Major Depressive Disorder Single Episode Mild  (ICD-296.21) 12)  Cellulitis, Foot, Right  (ICD-682.7) 13)  Foot Pain, Right   (ICD-729.5) 14)  Gout  (ICD-274.9) 15)  Bronchopneumonia Organism Unspecified  (ICD-485) 16)  Preventive Health Care  (ICD-V70.0) 17)  Knee Pain  (ICD-719.46) 18)  Renal Insufficiency, Acute  (ICD-585.9) 19)  Low Back Pain, Acute  (ICD-724.2) 20)  Adverse Side Effect, Diuretic  (ICD-E944.4) 21)  Metabolic Acidosis  (ICD-276.2) 22)  Headache  (ICD-784.0) 23)  Muscle Cramps  (ICD-729.82) 24)  Health Screening  (ICD-V70.0) 25)  Aftercare, Long-term Use, Medications Nec  (ICD-V58.69) 26)  Hyperlipidemia  (ICD-272.4) 27)  Chest Pain  (ICD-786.50) 28)  Sleep Apnea, Obstructive  (ICD-327.23) 29)  Coronary Artery Disease  (ICD-414.00) 30)  Hyperglycemia  (ICD-790.6) 31)  Alcohol Abuse  (ICD-305.00) 32)  Morbid Obesity  (ICD-278.01) 33)  Hypertension  (ICD-401.9) 34)  Anxiety  (ICD-300.00)  Current Medications (verified): 1)  Tramadol Hcl 50 Mg Tabs (Tramadol Hcl) .... Take 1 Tablet By Mouth Four Times A Day 2)  Carvedilol 25 Mg Tabs (Carvedilol) .... Take 1 Tablet Twice A Day. 3)  Bayer Aspirin 325 Mg Tabs (Aspirin) .... Take 1 Tablet By Mouth Once A Day 4)  Ativan 1 Mg Tabs (Lorazepam) .... Take 1 Tablet 1/2 Hour Before Your Mri.  You Will Need Someone To Drive You To and From The Test.   May Take Another Tablet Within The Hour If Not Enough. 5)  Gabapentin 300 Mg Caps (Gabapentin) .... Taper Up To Three Tabs  Three Times A Day 6)  Allopurinol 300 Mg Tabs (Allopurinol) .Marland Kitchen.. 1 Tab Daily 7)  Ventolin Hfa 108 (90 Base) Mcg/act Aers (Albuterol Sulfate) 8)  Zocor 40 Mg Tabs (Simvastatin) .... Take 1 Tab By Mouth At Bedtime 9)  Omeprazole 40 Mg Cpdr (Omeprazole) .... Take 1 Tablet By Mouth Two Times A Day 10)  Voltaren 1 % Gel (Diclofenac Sodium) .... Apply On Your Knees Every 6 Hours As Needed For Pain. 11)  Amlodipine Besylate 5 Mg Tabs (Amlodipine Besylate) .... Take 1 Tablet By Mouth Once A Day 12)  Nitrostat 0.3 Mg Subl (Nitroglycerin) .... Take One Tablet For Chest Pain As Needed. May  Repeat Tw More Times 5 Minutes Apart. 13)  Zoloft 50 Mg Tabs (Sertraline Hcl) .... Take 1/2 By Mouth Daily For 7 Days Then One Tablet Daily  Allergies (verified): 1)  ! Ace Inhibitors 2)  ! Darvocet 3)  ! * Dilaudid  Past History:  Family History: Last updated: 10/11/08 Father died of unknown cause  Mother died with alzheimer's Siblings with DM, no known CAD  Risk Factors: Alcohol Use: 0 (05/31/2009) Exercise: no (05/31/2009)  Risk Factors: Smoking Status: never (05/31/2009)  Past medical, surgical, family and social histories (including risk factors) reviewed for relevance to current acute and chronic problems.  Past Medical History: Reviewed history from 07/19/2008 and no changes required. Morbid obesity Hypertension Hyperlipidemia Coronary artery disease- non-Q wave MI's in '04, '05;     cath 9/06 with 10-15% left circumflex, diffuse OM and RCA disease.      Myoview in 05/08: Fixed defect at the apex and inferior wall.  No stress inducedischemia. Sleep apnea- mild to moderate per sleep study 3/07 Anxiety Alcohol abuse- sober since 11/06?, Relapsed (EtOH 134 on 06/12/08) GERD Gout (clinical Dx. No arthrocentesis.) S/p hysterectomy. Chronic renal insufficiency (baseline Cr 1.2-1.6)  Past Surgical History: Reviewed history from 12/13/2005 and no changes required. Hysterectomy- secondary to endometriosis  Family History: Reviewed history from 10/11/08 and no changes required. Father died of unknown cause  Mother died with alzheimer's Siblings with DM, no known CAD  Social History: Reviewed history from 07/19/2008 and no changes required. No smoking,  quit etoh this year, relapsed, reports sober 07/19/08 no illegal drugs.   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  Review of Systems       per HPI  Physical Exam  General:  alert, well-developed, well-hydrated, and overweight-appearing.   Head:  normocephalic and atraumatic.   Eyes:  vision grossly  intact, pupils equal, pupils round, and pupils reactive to light.   Ears:  R ear normal and L ear normal.   Nose:  no external deformity, nasal dischargemucosal pallor, mucosal erythema, airflow obstruction, L frontal sinus tenderness, L maxillary sinus tenderness, R frontal sinus tenderness, and R maxillary sinus tenderness.   Mouth:  good dentition, no gingival abnormalities, no dental plaque, and pharynx pink and moist.   Neck:  supple, full ROM, and no masses.   Lungs:  Mild wheezes, fair air movement, no crackles and noronchi. Heart:  normal rate, regular rhythm, no murmur, no gallop, no rub, and no JVD.   Abdomen:  soft, non-tender, normal bowel sounds, and no distention.   Msk:  Pain to palpation on her knees, no redness, no swelling and no warmth. Range of motion is mild limited and she has bilateral crepitation. Pulses:  R and L carotid,radial,femoral,dorsalis pedis and posterior tibial pulses are full and equal bilaterally Extremities:  No clubbing, cyanosis, edema, or deformity  noted with normal full range of motion of all joints.   Neurologic:  alert & oriented X3, strength normal in all extremities, sensation intact to light touch, and gait assisted with a walker. Skin:  Plantar surface of mid- Right foot with excoriation and mild maceration approximately 0.5 cmin diameter. Psych:  Oriented X3, memory intact for recent and remote, normally interactive, good eye contact, not anxious appearing, and not depressed appearing.     Impression & Recommendations:  Problem # 1:  CHEST PAIN (ICD-786.50) atypical. hospital w/u neg for CVD. Most likely depression-related. Changed fromCelexa to Zoloft due to formulary issues.  Problem # 2:  MAJOR DEPRESSIVE DISORDER SINGLE EPISODE MILD (ICD-296.21) Per problem #1 above. No SI/HI or mania. Instructed to call 911 or ED ifdevelops any signs of SI/HI or mania. Patient verbalized understanding.  Problem # 3:  HYPERGLYCEMIA (ICD-790.6) Patient  c/o increased thirst; she is obese, has a FMx of DM; and a history of hyperglycemia. Will check HgbA1C. Orders: T-Hgb A1C (in-house) ([id number])  Complete Medication List: 1)  Tramadol Hcl 50 Mg Tabs (Tramadol hcl) .... Take 1 tablet by mouth four times a day 2)  Carvedilol 25 Mg Tabs (Carvedilol) .... Take 1 tablet twice a day. 3)  Bayer Aspirin 325 Mg Tabs (Aspirin) .... Take 1 tablet by mouth once a day 4)  Ativan 1 Mg Tabs (Lorazepam) .... Take 1 tablet 1/2 hour before your mri.  you will need someone to drive you to and from the test.   may take another tablet within the hour if not enough. 5)  Gabapentin 300 Mg Caps (Gabapentin) .... Taper up to three tabs three times a day 6)  Allopurinol 300 Mg Tabs (Allopurinol) .Marland Kitchen.. 1 tab daily 7)  Ventolin Hfa 108 (90 Base) Mcg/act Aers (Albuterol sulfate) 8)  Zocor 40 Mg Tabs (Simvastatin) .... Take 1 tab by mouth at bedtime 9)  Omeprazole 40 Mg Cpdr (Omeprazole) .... Take 1 tablet by mouth two times a day 10)  Voltaren 1 % Gel (Diclofenac sodium) .... Apply on your knees every 6 hours as needed for pain. 11)  Amlodipine Besylate 5 Mg Tabs (Amlodipine besylate) .... Take 1 tablet by mouth once a day 12)  Nitrostat 0.3 Mg Subl (Nitroglycerin) .... Take one tablet for chest pain as needed. may repeat tw more times 5 minutes apart. 13)  Zoloft 50 Mg Tabs (Sertraline hcl) .... Take 1/2 by mouth daily for 7 days then one tablet daily Prescriptions: ZOLOFT 50 MG TABS (SERTRALINE HCL) Take 1/2 by mouth daily for 7 days then one tablet daily  #30 x 6   Entered and Authorized by:   [name]   Signed by:   [name] on 05/31/2009   Method used:   Faxed to ...       [facility] Department (retail)       [address] [place], [place]  [address]       Ph: [phone]       Fax: [phone]   RxID:   [id number]    Prevention & Chronic Care Immunizations   Influenza vaccine: Not documented   Influenza vaccine  deferral: Refused  (11/24/2008)    Tetanus booster: Not documented   Td booster deferral: Not indicated  (02/02/2009)   Tetanus booster due: 02/03/2019    Pneumococcal vaccine: Not documented  Colorectal Screening   Hemoccult: Not documented    Colonoscopy: Not documented   Colonoscopy action/deferral:  Deferred  (11/24/2008)  Other Screening   Pap smear: NEGATIVE FOR INTRAEPITHELIAL LESIONS OR MALIGNANCY.  (11/24/2008)   Pap smear action/deferral: Ordered  (11/24/2008)    Mammogram: ASSESSMENT: Negative - BI-RADS 1^MM DIGITAL SCREENING  (11/17/2008)   Mammogram action/deferral: Ordered  (11/11/2008)   Smoking status: never  (05/31/2009)  Lipids   Total Cholesterol: 111  (05/18/2008)   LDL: 63  (05/18/2008)   LDL Direct: Not documented   HDL: 31  (05/18/2008)   Triglycerides: 84  (05/18/2008)    SGOT (AST): 17  (04/14/2009)   SGPT (ALT): 15  (04/14/2009)   Alkaline phosphatase: 124  (04/14/2009)   Total bilirubin: 0.8  (04/14/2009)  Hypertension   Last Blood Pressure: 125 / 80  (05/31/2009)   Serum creatinine: 1.35  (04/22/2009)   Serum potassium 4.1  (04/22/2009)  Self-Management Support :   Personal Goals (by the next clinic visit) :      Personal blood pressure goal: 140/90  (05/31/2009)     Personal LDL goal: 100  (05/31/2009)    Patient will work on the following items until the next clinic visit to reach self-care goals:     Medications and monitoring: take my medicines every day, bring all of my medications to every visit  (05/31/2009)     Eating: use fresh or frozen vegetables, eat foods that are low in salt, eat fruit for snacks and desserts, limit or avoid alcohol  (05/31/2009)     Activity: take a 30 minute walk every day  (04/22/2009)     Other: walked with grand daughter but started wheezing but had to stop  (12/21/2008)    Hypertension self-management support: Resources for patients handout  (05/31/2009)    Lipid self-management support: Resources  for patients handout  (05/31/2009)     Self-management comments: UNABLE TO WALK MUCH.      Resource handout printed.   Laboratory Results   Blood Tests   Date/Time Received: May 31, 2009 4:57 PM  Date/Time Reported: Burke Keels  May 31, 2009 4:57 PM   HGBA1C: 5.7%   (Normal Range: Non-Diabetic - 3-6%   Control Diabetic - 6-8%)

## 2010-03-28 NOTE — Miscellaneous (Signed)
Summary: Hospital Admission Note  INTERNAL MEDICINE ADMISSION HISTORY AND PHYSICAL PCP: Rudy Jew # [phone] Q6: [facility] # [phone] Attending Phsycian: Dr. Doneen Poisson  CC: Chest Pain HPI: Reports persistent chest pain starting last night while washing clothes. Pain is in the left chest radiating up to the left neck and arm. Pain is sharp, 6/10 scale in intensity; lasted for an hour. Worse with lying flat.; better with sitting foward. No diaphoresis; but endorses nausea, "heart racing," and SOB. No edema, cough,fever,or chills.Pain was relieved after one dose of NTG and Mrophine at the ED.  Patient denies similar episodes in the past. ALLERGIES:  ! ACE INHIBITORS ! DARVOCET ! * DILAUDID  PAST MEDICAL HISTORY: Morbid obesity Hypertension Hyperlipidemia Coronary artery disease- non-Q wave MI's in '04, '05;     cath 9/06 with 10-15% left circumflex, diffuse OM and RCA disease.      Myoview in 05/08: Fixed defect at the apex and inferior wall.  No stress inducedischemia. Sleep apnea- mild to moderate per sleep study 3/07 Anxiety Alcohol abuse- sober since (EtOH 134 on 06/12/08) GERD Gout (clinical Dx. No arthrocentesis.) S/p hysterectomy. Chronic renal insufficiency (baseline Cr 1.2-1.6)  MEDICATIONS: Current Meds:  FUROSEMIDE 40 MG  TABS (FUROSEMIDE) Take 1 tablet by mouth twice a day. TRAMADOL HCL 50 MG TABS (TRAMADOL HCL) take 1 tablet by mouth four times a day CARVEDILOL 25 MG TABS (CARVEDILOL) take 1 tablet twice a day. BAYER ASPIRIN 325 MG TABS (ASPIRIN) Take 1 tablet by mouth once a day IBUPROFEN 200 MG TABS (IBUPROFEN) Take 3 tablets by mouth three times a day with meals as needed ATIVAN 1 MG TABS (LORAZEPAM) take 1 tablet 1/2 hour before your MRI.  You will need someone to drive you to and from the test.   May take another tablet within the hour if not enough. GABAPENTIN 300 MG CAPS (GABAPENTIN) taper up to three tabs three times a day ALLOPURINOL 300 MG TABS  (ALLOPURINOL) 1 tab daily VENTOLIN HFA 108 (90 BASE) MCG/ACT AERS (ALBUTEROL SULFATE)  ZOCOR 40 MG TABS (SIMVASTATIN) Take 1 tab by mouth at bedtime CITALOPRAM HYDROBROMIDE 40 MG TABS (CITALOPRAM HYDROBROMIDE) Take 1 tablet by mouth once a day OMEPRAZOLE 40 MG CPDR (OMEPRAZOLE) Take 1 tablet by mouth two times a day VOLTAREN 1 % GEL (DICLOFENAC SODIUM) Apply on your knees every 6 hours as needed for pain.   SOCIAL HISTORY: No smoking,  quit etoh -> reports sober 07/19/08 no illegal drugs.   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  FAMILY HISTORY Father died of unknown cause  Mother died with alzheimer's Siblings with DM, no known CAD  ROS: per HPI  VITALS: T: 98.3 P:83  BP: 151/80 R: 22 O2SAT: 100% ON:RA PHYSICAL EXAM: General:  alert, well-developed, and cooperative to examination.   Head:  normocephalic and atraumatic.   Eyes:  vision grossly intact, pupils equal, pupils round, pupils reactive to light, no injection and anicteric.   Mouth:  pharynx pink and moist, no erythema, and no exudates.   Neck:  supple, full ROM, no thyromegaly, no JVD, and no carotid bruits.   Lungs:  normal respiratory effort, no accessory muscle use, normal breath sounds, no crackles, and no wheezes.  CV: RRR, no M, S3, S4, no lifts or rubs. Abdomen: ND; BS+, NTTP, no HSM MSK: FROM of all joints; no joint effusion, erythema or increase in warmth with touch. MSK: FROM of all extremities proximately and distally bialterally; no joint erythema, effusion or increased warmth  to touch bilaterally.  Neurologic:  alert & oriented X3, cranial nerves II-XII intact, strength normal in all extremities, sensation intact to light touch, and gait normal.   Skin:  turgor normal and no rashes.   Psych:  Oriented X3, memory intact for recent and remote, normally interactive, good eye contact, not anxious appearing, and not depressed appearing.  LABS:  WBC                                      5.7                4.0-10.5         K/uL  RBC                                      3.86       l      3.87-5.11        MIL/uL  Hemoglobin (HGB)                         11.9       l      12.0-15.0        g/dL  Hematocrit (HCT)                         35.1       l      36.0-46.0        %  MCV                                      91.1              78.0-100.0       fL  MCHC                                     33.7              30.0-36.0        g/dL  RDW                                      15.2              11.5-15.5        %  Platelet Count (PLT)                     210               150-400          K/uL  Neutrophils, %                           62                43-77            %  Lymphocytes, %  31                12-46            %  Monocytes, %                             5                 3-12             %  Eosinophils, %                           2                 0-5              %  Basophils, %                             0                 0-1              %  Neutrophils, Absolute                    3.5               1.7-7.7          K/uL  Lymphocytes, Absolute                    1.8               0.7-4.0          K/uL  Monocytes, Absolute                      0.3               0.1-1.0          K/uL  Eosinophils, Absolute                    0.1               0.0-0.7          K/uL  Basophils, Absolute                      0.0               0.0-0.1          K/uL  Protime ( Prothrombin Time)              14.5              11.6-15.2        seconds  INR                                      1.14              0.00-1.49  CKMB, POC                                1.6  1.0-8.0          ng/mL  Troponin I, POC                          <0.05             0.00-0.09        ng/mL  Myoglobin, POC                           144               12-200           ng/mL  TCO2                                     31                0-100            mmol/L  Ionized Calcium                           1.11       l      1.12-1.32        mmol/L  Hemoglobin (HGB)                         12.6              12.0-15.0        g/dL  Hematocrit (HCT)                         37.0              36.0-46.0        %  Sodium (NA)                              139               135-145          mEq/L  Potassium (K)                            4.3               3.5-5.1          mEq/L  Chloride                                 103               96-112           mEq/L  Glucose                                  107        h      70-99            mg/dL  BUN  25         h      6-23             mg/dL  Creatinine                               1.4        h      0.4-1.2          mg/dL  CXR: IMPRESSION:   No acute cardiopulmonary findings.  Mild stable cardiac   enlargement.  2D ECHO (11/2008):1. Left ventricle: The cavity size was normal. Wall thickness was      increased in a pattern of mild LVH. There was mild concentric      hypertrophy. Systolic function was normal. The estimated ejection      fraction was in the range of 50% to 55%. Wall motion was normal;      there were no regional wall motion abnormalities.   2. Aortic valve: Trivial regurgitation.   3. Atrial septum: No defect or patent foramen ovale was identified.   Transthoracic echocardiography. M-mode, complete 2D, spectral   Doppler, and color Doppler. Patient status: Inpatient.  ASSESSMENT:  (1) CP. Diffrerential list of diganoses include: *CAD -> CAD Risk (circle all that apply): HTN Obesity PVD LDL FMH DM HDL Smoking Age Sedentary *Muskuloskeletal CP -> myofascial strain, costochondritis --given chest wall TTP. *GERD --possible, has as an established Dx.  *Esophageal spasm *Cocaine induced --> will check UDS *Pericarditis -> unlikely given presentation *Pneumonia - >no infiltrate on CXR *PE --> unlikely; Wells score is 0.   PLAN: Will admite to a telemtry floor for CP rule out. - cardiac enzymes x 2  more q 8 hr - EKG now and in AM - ASA - Metoprolol 12.5 mg PO BID, hold for HR lower than 55 bpm - O2 by [place] to keep SpO2 greater than 92% - UA - Urine toxic screen - CBCD, BMP in AM - Fasting lipids - Morphine 2 mg IV q 2-4 hr PRN chest pain - Tylenol 650 mg PO q 4-6 hr PRN headache - Protonix 40 mg by mouth daily - Heparin 5000 U SQ BID  (2)HTN: Continue with Metoprolol and Lasix. Will monitor renal function and electrolytes.  (3)DEPRESSION, stable. Continue with Celexa.  (4) Gout --> currently, is without a flare up. Will continue with Allopurinol.

## 2010-03-28 NOTE — Discharge Summary (Signed)
Summary: Hospital Discharge Update    Hospital Discharge Update:  Date of Discharge: 07/18/2009  Brief Summary:  Patient with hx of mild CAD with cath 2004 (10-15% obstruccion), HTN, HLD and obesity; who came to ED complaining of chest pain (left side), no radiated; with normal cardiac markers and no significant changes on her EKG. Admitted for same complaints 1 1/2 month ago, with plan to be stratify as an outpatient by Dr. Sharyn Lull. Due to lack of money she never made the appointment. This time due to normal markers, been chest pain free and after discussing with Dr. Sharyn Lull and Dr. Coralee Pesa (IM Attending on call) about patient situation, the decision for cardioloite on Dr. Sharyn Lull office next day was made and patient was discharge home from the ED.  Lab or other results pending at discharge:  Cardiolite stress test; appointment to be arrange between patient and Dr. Sharyn Lull office.  The medication, problem, and allergy lists have been updated.  Please see the dictated discharge summary for details.  Discharge medications:  TRAMADOL HCL 50 MG TABS (TRAMADOL HCL) take 1 tablet by mouth four times a day CARVEDILOL 25 MG TABS (CARVEDILOL) take 1 tablet twice a day. BAYER ASPIRIN 325 MG TABS (ASPIRIN) Take 1 tablet by mouth once a day ATIVAN 1 MG TABS (LORAZEPAM) take 1 tablet 1/2 hour before your MRI.  You will need someone to drive you to and from the test.   May take another tablet within the hour if not enough. GABAPENTIN 300 MG CAPS (GABAPENTIN) taper up to three tabs three times a day ALLOPURINOL 300 MG TABS (ALLOPURINOL) 1 tab daily VENTOLIN HFA 108 (90 BASE) MCG/ACT AERS (ALBUTEROL SULFATE)  ZOCOR 40 MG TABS (SIMVASTATIN) Take 1 tab by mouth at bedtime OMEPRAZOLE 40 MG CPDR (OMEPRAZOLE) Take 1 tablet by mouth two times a day VOLTAREN 1 % GEL (DICLOFENAC SODIUM) Apply on your knees every 6 hours as needed for pain. AMLODIPINE BESYLATE 5 MG TABS (AMLODIPINE BESYLATE) Take 1 tablet by  mouth once a day NITROSTAT 0.3 MG SUBL (NITROGLYCERIN) Take one tablet for chest pain as needed. May repeat tw more times 5 minutes apart. ZOLOFT 50 MG TABS (SERTRALINE HCL) Take 1/2 by mouth daily for 7 days then one tablet daily  Other patient instructions:  -Call Dr. Sharyn Lull office tomorrow in order to be seen for your stress test.  -Take your medications as prescribed. -Our clinic will call you with appointment details to follow at the [facility] as well.  Note: Hospital Discharge Medications & Other Instructions handout was printed, one copy for patient and a second copy to be placed in hospital chart.

## 2010-03-28 NOTE — Assessment & Plan Note (Signed)
Summary: acute-2 week f/u/cfb(karimova/cfb   Vital Signs:  Patient profile:   61 year old female Height:      62 inches (157.48 cm) Weight:      298.1 pounds (135.50 kg) BMI:     54.72 Temp:     96.9 degrees F (36.06 degrees C) oral Pulse rate:   65 / minute BP sitting:   127 / 90  (right arm)  Vitals Entered By: Stanton Kidney Ditzler RN (April 22, 2009 3:49 PM) Is Patient Diabetic? No Pain Assessment Patient in pain? yes     Location: knees Intensity: 7 Type: sharp Onset of pain  long time Nutritional Status BMI of > 30 = obese Nutritional Status Detail appetite good  Have you ever been in a relationship where you felt threatened, hurt or afraid?denies   Does patient need assistance? Functional Status Self care Ambulation Impaired:Risk for fall Comments Uses a walker. FU - feeling  better.   Primary Care Provider:  [name]   History of Present Illness: 60 y/o female with pmh as outlined in the EMR; who comes to the clinic for followup after having dizziness, increased fatigue, depression and also chest pain secondary to reflux disease. Today patient is more energetic, denies any dizziness or chest pain and is feeling well.  Patient is only complaining of bilateral knees pain, which is chronic and secondary to OA.  Patient's BP is stable at 127/90 and there is not orthostatic changes.  Patient is now compliant with her medications and is reporting improvement in her mood. No SI and no hallucinations are reported.  Depression History:      The patient denies a depressed mood most of the day and a diminished interest in her usual daily activities.        The patient denies that she feels like life is not worth living, denies that she wishes that she were dead, and denies that she has thought about ending her life.         Problems Prior to Update: 1)  Sinusitis, Acute  (ICD-461.9) 2)  Dizziness  (ICD-780.4) 3)  Wheezing  (ICD-786.07) 4)  Pain in Joint,  Multiple Sites  (ICD-719.49) 5)  Fall, Hx of  (ICD-V15.88) 6)  Osteoarthritis, Sacroiliac Joint  (ICD-715.98) 7)  Tinea Pedis  (ICD-110.4) 8)  Back Pain  (ICD-724.5) 9)  Back Pain  (ICD-724.5) 10)  Hemoccult Positive Stool  (ICD-578.1) 11)  Major Depressive Disorder Single Episode Mild  (ICD-296.21) 12)  Cellulitis, Foot, Right  (ICD-682.7) 13)  Foot Pain, Right  (ICD-729.5) 14)  Gout  (ICD-274.9) 15)  Bronchopneumonia Organism Unspecified  (ICD-485) 16)  Preventive Health Care  (ICD-V70.0) 17)  Knee Pain  (ICD-719.46) 18)  Renal Insufficiency, Acute  (ICD-585.9) 19)  Low Back Pain, Acute  (ICD-724.2) 20)  Adverse Side Effect, Diuretic  (ICD-E944.4) 21)  Metabolic Acidosis  (ICD-276.2) 22)  Headache  (ICD-784.0) 23)  Muscle Cramps  (ICD-729.82) 24)  Health Screening  (ICD-V70.0) 25)  Aftercare, Long-term Use, Medications Nec  (ICD-V58.69) 26)  Hyperlipidemia  (ICD-272.4) 27)  Chest Pain  (ICD-786.50) 28)  Sleep Apnea, Obstructive  (ICD-327.23) 29)  Coronary Artery Disease  (ICD-414.00) 30)  Hyperglycemia  (ICD-790.6) 31)  Alcohol Abuse  (ICD-305.00) 32)  Morbid Obesity  (ICD-278.01) 33)  Hypertension  (ICD-401.9) 34)  Anxiety  (ICD-300.00)  Current Problems (verified): 1)  Sinusitis, Acute  (ICD-461.9) 2)  Dizziness  (ICD-780.4) 3)  Wheezing  (ICD-786.07) 4)  Pain in Joint, Multiple Sites  (ICD-719.49) 5)  Fall, Hx of  (ICD-V15.88) 6)  Osteoarthritis, Sacroiliac Joint  (ICD-715.98) 7)  Tinea Pedis  (ICD-110.4) 8)  Back Pain  (ICD-724.5) 9)  Back Pain  (ICD-724.5) 10)  Hemoccult Positive Stool  (ICD-578.1) 11)  Major Depressive Disorder Single Episode Mild  (ICD-296.21) 12)  Cellulitis, Foot, Right  (ICD-682.7) 13)  Foot Pain, Right  (ICD-729.5) 14)  Gout  (ICD-274.9) 15)  Bronchopneumonia Organism Unspecified  (ICD-485) 16)  Preventive Health Care  (ICD-V70.0) 17)  Knee Pain  (ICD-719.46) 18)  Renal Insufficiency, Acute  (ICD-585.9) 19)  Low Back Pain, Acute   (ICD-724.2) 20)  Adverse Side Effect, Diuretic  (ICD-E944.4) 21)  Metabolic Acidosis  (ICD-276.2) 22)  Headache  (ICD-784.0) 23)  Muscle Cramps  (ICD-729.82) 24)  Health Screening  (ICD-V70.0) 25)  Aftercare, Long-term Use, Medications Nec  (ICD-V58.69) 26)  Hyperlipidemia  (ICD-272.4) 27)  Chest Pain  (ICD-786.50) 28)  Sleep Apnea, Obstructive  (ICD-327.23) 29)  Coronary Artery Disease  (ICD-414.00) 30)  Hyperglycemia  (ICD-790.6) 31)  Alcohol Abuse  (ICD-305.00) 32)  Morbid Obesity  (ICD-278.01) 33)  Hypertension  (ICD-401.9) 34)  Anxiety  (ICD-300.00)  Medications Prior to Update: 1)  Furosemide 40 Mg  Tabs (Furosemide) .... Take 1 Tablet By Mouth Twice A Day. 2)  Tramadol Hcl 50 Mg Tabs (Tramadol Hcl) .... Take 1 Tablet By Mouth Four Times A Day 3)  Carvedilol 25 Mg Tabs (Carvedilol) .... Take 1 Tablet Twice A Day. 4)  Bayer Aspirin 325 Mg Tabs (Aspirin) .... Take 1 Tablet By Mouth Once A Day 5)  Ibuprofen 200 Mg Tabs (Ibuprofen) .... Take 3 Tablets By Mouth Three Times A Day With Meals As Needed 6)  Ativan 1 Mg Tabs (Lorazepam) .... Take 1 Tablet 1/2 Hour Before Your Mri.  You Will Need Someone To Drive You To and From The Test.   May Take Another Tablet Within The Hour If Not Enough. 7)  Gabapentin 300 Mg Caps (Gabapentin) .... Taper Up To Three Tabs Three Times A Day 8)  Allopurinol 300 Mg Tabs (Allopurinol) .Marland Kitchen.. 1 Tab Daily 9)  Ventolin Hfa 108 (90 Base) Mcg/act Aers (Albuterol Sulfate) 10)  Zocor 40 Mg Tabs (Simvastatin) .... Take 1 Tab By Mouth At Bedtime 11)  Citalopram Hydrobromide 40 Mg Tabs (Citalopram Hydrobromide) .... Take 1 Tablet By Mouth Once A Day 12)  Omeprazole 40 Mg Cpdr (Omeprazole) .... Take 1 Tablet By Mouth Two Times A Day  Current Medications (verified): 1)  Furosemide 40 Mg  Tabs (Furosemide) .... Take 1 Tablet By Mouth Twice A Day. 2)  Tramadol Hcl 50 Mg Tabs (Tramadol Hcl) .... Take 1 Tablet By Mouth Four Times A Day 3)  Carvedilol 25 Mg Tabs  (Carvedilol) .... Take 1 Tablet Twice A Day. 4)  Bayer Aspirin 325 Mg Tabs (Aspirin) .... Take 1 Tablet By Mouth Once A Day 5)  Ibuprofen 200 Mg Tabs (Ibuprofen) .... Take 3 Tablets By Mouth Three Times A Day With Meals As Needed 6)  Ativan 1 Mg Tabs (Lorazepam) .... Take 1 Tablet 1/2 Hour Before Your Mri.  You Will Need Someone To Drive You To and From The Test.   May Take Another Tablet Within The Hour If Not Enough. 7)  Gabapentin 300 Mg Caps (Gabapentin) .... Taper Up To Three Tabs Three Times A Day 8)  Allopurinol 300 Mg Tabs (Allopurinol) .Marland Kitchen.. 1 Tab Daily 9)  Ventolin Hfa 108 (90 Base) Mcg/act Aers (Albuterol Sulfate) 10)  Zocor 40 Mg Tabs (Simvastatin) .... Take 1  Tab By Mouth At Bedtime 11)  Citalopram Hydrobromide 40 Mg Tabs (Citalopram Hydrobromide) .... Take 1 Tablet By Mouth Once A Day 12)  Omeprazole 40 Mg Cpdr (Omeprazole) .... Take 1 Tablet By Mouth Two Times A Day  Allergies: 1)  ! Ace Inhibitors 2)  ! Darvocet 3)  ! * Dilaudid  Past History:  Past Medical History: Last updated: 07/19/2008 Morbid obesity Hypertension Hyperlipidemia Coronary artery disease- non-Q wave MI's in '04, '05;     cath 9/06 with 10-15% left circumflex, diffuse OM and RCA disease.      Myoview in 05/08: Fixed defect at the apex and inferior wall.  No stress inducedischemia. Sleep apnea- mild to moderate per sleep study 3/07 Anxiety Alcohol abuse- sober since 11/06?, Relapsed (EtOH 134 on 06/12/08) GERD Gout (clinical Dx. No arthrocentesis.) S/p hysterectomy. Chronic renal insufficiency (baseline Cr 1.2-1.6)  Past Surgical History: Last updated: 12/13/2005 Hysterectomy- secondary to endometriosis  Family History: Last updated: 09/29/08 Father died of unknown cause  Mother died with alzheimer's Siblings with DM, no known CAD  Social History: Last updated: 07/19/2008 No smoking,  quit etoh this year, relapsed, reports sober 07/19/08 no illegal drugs.   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  Risk Factors: Alcohol Use: 0 (04/14/2009) Exercise: no (04/14/2009)  Risk Factors: Smoking Status: never (04/14/2009)  Review of Systems       As per HPI.  Physical Exam  General:  alert, well-developed, well-hydrated, and overweight-appearing.   Lungs:  Mild wheezes, fair air movement, no crackles and noronchi. Heart:  normal rate, regular rhythm, no murmur, no gallop, no rub, and no JVD.   Abdomen:  soft, non-tender, normal bowel sounds, and no distention.   Msk:  Pain to palpation on her knees, no redness, no swelling and no warmth. Range of motion is mild limited and she has bilateral crepitation. Neurologic:  alert & oriented X3, strength normal in all extremities, sensation intact to light touch, and gait assisted with a walker.   Impression & Recommendations:  Problem # 1:  DIZZINESS (ICD-780.4) Now resolved. Secondary to orthostatic changes and high dose of antihypertensive drugs. Now resolved. Will continue current medication regimen and adjusted antihypertensive doses.   Problem # 2:  MAJOR DEPRESSIVE DISORDER SINGLE EPISODE MILD (ICD-296.21) Depressio is slowly improving. Will continue celexa 40mg  daily for now and will encourage her to follow wirth psychiatrist. She denies any SI or hallucinations.  Problem # 3:  CHEST PAIN (ICD-786.50) resolved and atypical; secondary to GERD. Now well controlled with the use of PPI; will refresh instructions for lifestyle modifications and will advised to be compliant with her medications.  Problem # 4:  HYPERTENSION (ICD-401.9) Cr improved and BP well controlled. will continue current regimen with lasix and coreg and will advised to follow a low sodium diet and to lose weight.  Her updated medication list for this problem includes:    Furosemide 40 Mg Tabs (Furosemide) .Marland Kitchen... Take 1 tablet by mouth twice a day.    Carvedilol 25 Mg Tabs (Carvedilol) .Marland Kitchen... Take 1 tablet twice a day.  Orders: T-Basic Metabolic Panel  ([id number])  BP today: 127/90 Prior BP: 116/78 (04/14/2009)  Prior 10 Yr Risk Heart Disease: N/A (07/19/2008)  Labs Reviewed: K+: 4.6 (04/14/2009) Creat: : 1.40 (04/14/2009)   Chol: 111 (05/18/2008)   HDL: 31 (05/18/2008)   LDL: 63 (05/18/2008)   TG: 84 (05/18/2008)  Problem # 5:  KNEE PAIN (ICD-719.46) Bilateral knee pain and crepitation 2/2 OA. Will continue using tramadol  and ibuprofen as pain killer regimen and will add voltaren gel topical to try to improved her pain control even more. Patient advised to lose weight.  Her updated medication list for this problem includes:    Tramadol Hcl 50 Mg Tabs (Tramadol hcl) .Marland Kitchen... Take 1 tablet by mouth four times a day    Bayer Aspirin 325 Mg Tabs (Aspirin) .Marland Kitchen... Take 1 tablet by mouth once a day    Ibuprofen 200 Mg Tabs (Ibuprofen) .Marland Kitchen... Take 3 tablets by mouth three times a day with meals as needed  Complete Medication List: 1)  Furosemide 40 Mg Tabs (Furosemide) .... Take 1 tablet by mouth twice a day. 2)  Tramadol Hcl 50 Mg Tabs (Tramadol hcl) .... Take 1 tablet by mouth four times a day 3)  Carvedilol 25 Mg Tabs (Carvedilol) .... Take 1 tablet twice a day. 4)  Bayer Aspirin 325 Mg Tabs (Aspirin) .... Take 1 tablet by mouth once a day 5)  Ibuprofen 200 Mg Tabs (Ibuprofen) .... Take 3 tablets by mouth three times a day with meals as needed 6)  Ativan 1 Mg Tabs (Lorazepam) .... Take 1 tablet 1/2 hour before your mri.  you will need someone to drive you to and from the test.   may take another tablet within the hour if not enough. 7)  Gabapentin 300 Mg Caps (Gabapentin) .... Taper up to three tabs three times a day 8)  Allopurinol 300 Mg Tabs (Allopurinol) .Marland Kitchen.. 1 tab daily 9)  Ventolin Hfa 108 (90 Base) Mcg/act Aers (Albuterol sulfate) 10)  Zocor 40 Mg Tabs (Simvastatin) .... Take 1 tab by mouth at bedtime 11)  Citalopram Hydrobromide 40 Mg Tabs (Citalopram hydrobromide) .... Take 1 tablet by mouth once a day 12)  Omeprazole 40 Mg Cpdr  (Omeprazole) .... Take 1 tablet by mouth two times a day 13)  Voltaren 1 % Gel (Diclofenac sodium) .... Apply on your knees every 6 hours as needed for pain.  Patient Instructions: 1)  con tinue avoiding foods high in acid (tomatoes, citrus juices, spicy foods). Avoid eating within two hours of lying down or before exercising. Do not over eat; try smaller more frequent meals.  2)  Take your medications as prescribed. 3)  Follow a low sodium diet (less than 2500mg  daily). 4)  Please schedule a follow-up appointment in 3 months. 5)  You will be called with any abnormalities in the tests scheduled or performed today.  If you don't hear from [name] within a week from when the test was performed, you can assume that your test was normal. Prescriptions: VOLTAREN 1 % GEL (DICLOFENAC SODIUM) Apply on your knees every 6 hours as needed for pain.  #1 x 3   Entered and Authorized by:   Vassie Loll MD   Signed by:   Vassie Loll MD on 04/22/2009   Method used:   Electronically to        [facility] (retail)       [address]       [place], [place]  [address]       Ph: [phone]       Fax: [phone]   RxID:   [id number]  Process Orders Check Orders Results:     [facility]: ABN not required for this insurance Tests Sent for requisitioning (April 27, 2009 3:46 PM):     04/22/2009: [facility] -- T-Basic Metabolic Panel [id number] (signed)  Prevention & Chronic Care Immunizations   Influenza vaccine: Not documented   Influenza vaccine deferral: Refused  (11/24/2008)    Tetanus booster: Not documented   Td booster deferral: Not indicated  (02/02/2009)   Tetanus booster due: 02/03/2019    Pneumococcal vaccine: Not documented  Colorectal Screening   Hemoccult: Not documented    Colonoscopy: Not documented   Colonoscopy action/deferral: Deferred  (11/24/2008)  Other Screening   Pap smear: NEGATIVE FOR  INTRAEPITHELIAL LESIONS OR MALIGNANCY.  (11/24/2008)   Pap smear action/deferral: Ordered  (11/24/2008)    Mammogram: ASSESSMENT: Negative - BI-RADS 1^MM DIGITAL SCREENING  (11/17/2008)   Mammogram action/deferral: Ordered  (11/11/2008)   Smoking status: never  (04/14/2009)  Lipids   Total Cholesterol: 111  (05/18/2008)   LDL: 63  (05/18/2008)   LDL Direct: Not documented   HDL: 31  (05/18/2008)   Triglycerides: 84  (05/18/2008)    SGOT (AST): 17  (04/14/2009)   SGPT (ALT): 15  (04/14/2009)   Alkaline phosphatase: 124  (04/14/2009)   Total bilirubin: 0.8  (04/14/2009)  Hypertension   Last Blood Pressure: 127 / 90  (04/22/2009)   Serum creatinine: 1.40  (04/14/2009)   Serum potassium 4.6  (04/14/2009)  Self-Management Support :    Patient will work on the following items until the next clinic visit to reach self-care goals:     Medications and monitoring: take my medicines every day, bring all of my medications to every visit  (04/22/2009)     Eating: eat more vegetables, use fresh or frozen vegetables, eat foods that are low in salt, eat baked foods instead of fried foods, eat fruit for snacks and desserts, limit or avoid alcohol  (04/22/2009)     Activity: take a 30 minute walk every day  (04/22/2009)     Other: walked with grand daughter but started wheezing but had to stop  (12/21/2008)    Hypertension self-management support: Education handout, Written self-care plan, Resources for patients handout  (04/22/2009)   Hypertension self-care plan printed.   Hypertension education handout printed    Lipid self-management support: Education handout, Written self-care plan, Resources for patients handout  (04/22/2009)   Lipid self-care plan printed.   Lipid education handout printed      Resource handout printed.

## 2010-03-28 NOTE — Letter (Signed)
Summary: [facility] Request  [facility] Request   Imported By: Clydell Hakim 05/31/2009 15:38:10  _____________________________________________________________________  External Attachment:    Type:   Image     Comment:   External Document

## 2010-03-28 NOTE — Progress Notes (Signed)
Summary: PAGE 3/ [facility]  Phone Note Refill Request Message from:  Patient on September 05, 2009 9:32 AM  Refills Requested: Medication #1:  SERTRALINE HCL 100 MG TABS Take 1 tablet by mouth once a day. PAGE 3  Initial call taken by: Marin Roberts RN,  September 05, 2009 9:33 AM  Follow-up for Phone Call        Rx completed in Dr. Tiajuana Amass Follow-up by: [name],  September 05, 2009 5:07 PM

## 2010-03-28 NOTE — Assessment & Plan Note (Signed)
Summary: EST-2 WEEK RECHECK/CH   Vital Signs:  Patient profile:   60 year old female Height:      62 inches (157.48 cm) Weight:      293.1 pounds (133.23 kg) BMI:     53.80 O2 Sat:      97 % on Room air Temp:     97.4 degrees F oral Pulse rate:   60 / minute BP sitting:   139 / 90  (right arm)  Vitals Entered By: Chinita Pester RN (October 11, 2009 9:12 AM)  O2 Flow:  Room air CC: F/U visit.  Is Patient Diabetic? No Pain Assessment Patient in pain? no      Nutritional Status BMI of > 30 = obese  Have you ever been in a relationship where you felt threatened, hurt or afraid?No   Does patient need assistance? Functional Status Self care Ambulation Impaired:Risk for fall Comments uses a rolling walker   Primary Care Mekiah Wahler:  [name]  CC:  F/U visit. Marland Kitchen  History of Present Illness: 1. Depression --"better." no SI/HI or mania. Sees Dr. Juanita Craver with Mental Health. 2. Severe LBP and OA --requests a"a raised stool for her toilet seat and a shower chair because tends to fall." 3. Requests a paperwork to be filled for a Handicapped parking permit.  Depression History:      The patient denies a depressed mood most of the day and a diminished interest in her usual daily activities.        Comments:  "I have my good days and bad days.".   Preventive Screening-Counseling & Management  Alcohol-Tobacco     Alcohol drinks/day: 0     Smoking Status: never  Caffeine-Diet-Exercise     Does Patient Exercise: no  Problems Prior to Update: 1)  Depression  (ICD-311) 2)  Pedal Edema  (ICD-782.3) 3)  Intrinsic Asthma, With Exacerbation  (ICD-493.12) 4)  Alkaline Phosphatase, Elevated  (ICD-790.5) 5)  Sinusitis, Acute  (ICD-461.9) 6)  Dizziness  (ICD-780.4) 7)  Wheezing  (ICD-786.07) 8)  Pain in Joint, Multiple Sites  (ICD-719.49) 9)  Fall, Hx of  (ICD-V15.88) 10)  Osteoarthritis, Sacroiliac Joint  (ICD-715.98) 11)  Tinea Pedis  (ICD-110.4) 12)  Back Pain   (ICD-724.5) 13)  Back Pain  (ICD-724.5) 14)  Hemoccult Positive Stool  (ICD-578.1) 15)  Major Depressive Disorder Single Episode Mild  (ICD-296.21) 16)  Cellulitis, Foot, Right  (ICD-682.7) 17)  Foot Pain, Right  (ICD-729.5) 18)  Gout  (ICD-274.9) 19)  Bronchopneumonia Organism Unspecified  (ICD-485) 20)  Preventive Health Care  (ICD-V70.0) 21)  Knee Pain  (ICD-719.46) 22)  Renal Insufficiency, Acute  (ICD-585.9) 23)  Low Back Pain, Acute  (ICD-724.2) 24)  Adverse Side Effect, Diuretic  (ICD-E944.4) 25)  Metabolic Acidosis  (ICD-276.2) 26)  Headache  (ICD-784.0) 27)  Muscle Cramps  (ICD-729.82) 28)  Health Screening  (ICD-V70.0) 29)  Aftercare, Long-term Use, Medications Nec  (ICD-V58.69) 30)  Hyperlipidemia  (ICD-272.4) 31)  Chest Pain  (ICD-786.50) 32)  Sleep Apnea, Obstructive  (ICD-327.23) 33)  Coronary Artery Disease  (ICD-414.00) 34)  Hyperglycemia  (ICD-790.6) 35)  Alcohol Abuse  (ICD-305.00) 36)  Morbid Obesity  (ICD-278.01) 37)  Hypertension  (ICD-401.9) 38)  Anxiety  (ICD-300.00)  Medications Prior to Update: 1)  Tramadol Hcl 50 Mg Tabs (Tramadol Hcl) .... Take 1 Tablet By Mouth Four Times A Day 2)  Carvedilol 25 Mg Tabs (Carvedilol) .... Take 1 Tablet Twice A Day. 3)  Bayer Aspirin 325 Mg Tabs (Aspirin) .Marland KitchenMarland KitchenMarland Kitchen  Take 1 Tablet By Mouth Once A Day 4)  Gabapentin 300 Mg Caps (Gabapentin) .... Taper Up To Three Tabs Three Times A Day 5)  Allopurinol 300 Mg Tabs (Allopurinol) .Marland Kitchen.. 1 Tab Daily 6)  Ventolin Hfa 108 (90 Base) Mcg/act Aers (Albuterol Sulfate) .... Take Two Puffs By Mouth Every 6 Hours  As Needed 7)  Zocor 40 Mg Tabs (Simvastatin) .... Take 1 Tab By Mouth At Bedtime 8)  Omeprazole 40 Mg Cpdr (Omeprazole) .... Take 1 Tablet By Mouth Two Times A Day 9)  Amlodipine Besylate 10 Mg Tabs (Amlodipine Besylate) .... Take 1 Tablet By Mouth Once A Day 10)  Nitrostat 0.3 Mg Subl (Nitroglycerin) .... Take One Tablet For Chest Pain As Needed. May Repeat Tw More Times 5 Minutes  Apart. 11)  Sertraline Hcl 100 Mg Tabs (Sertraline Hcl) .... Take 1 Tablet By Mouth Once A Day 12)  Lamotrigine 25 Mg Tabs (Lamotrigine) .... Tapper 13)  Nebulizer/adult Mask  Kit (Respiratory Therapy Supplies) .... Use With Albuterol and Ipratropium Q 6 Hrs As Needed 14)  Nebulizer  Misc (Nebulizers) .... Use With Albuterol and Impratropium Q 6 Hours As Needed 15)  Qvar 40 Mcg/act Aers (Beclomethasone Dipropionate) .... Two Puffs Two Times A Day As Needed 16)  Albuterol Sulfate (2.5 Mg/77ml) 0.083% Nebu (Albuterol Sulfate) .... Use One Vial Per Nebulizer Treatemnt Q 6 Hours As Needed 17)  Ipratropium Bromide 0.02 % Soln (Ipratropium Bromide) .... Use One Vial With Albuterol 6 Hours As Needed With Nebulizer 18)  Furosemide 20 Mg Tabs (Furosemide) .... Take 1 Tablet By Mouth Once A Day in The Morning 19)  Aerogear Action Asthma Kit  Kit (Peak Flow Meter-Inh Assist Dev) .... Use With All Your Inhalers As Instructed 20)  Depend Overnight Briefs Large  Misc (Incontinence Supply Disposable) .... Use As Directed As Needed 21)  Padsorber Bed Pan Liners  Misc (Incontinence Supply Disposable) .... Use As Needed  Allergies (verified): 1)  ! Ace Inhibitors 2)  ! Darvocet 3)  ! * Dilaudid  Directives: 1)  Full Code   Past History:  Past Medical History: Last updated: 07/19/2008 Morbid obesity Hypertension Hyperlipidemia Coronary artery disease- non-Q wave MI's in '04, '05;     cath 9/06 with 10-15% left circumflex, diffuse OM and RCA disease.      Myoview in 05/08: Fixed defect at the apex and inferior wall.  No stress inducedischemia. Sleep apnea- mild to moderate per sleep study 3/07 Anxiety Alcohol abuse- sober since 11/06?, Relapsed (EtOH 134 on 06/12/08) GERD Gout (clinical Dx. No arthrocentesis.) S/p hysterectomy. Chronic renal insufficiency (baseline Cr 1.2-1.6)  Past Surgical History: Last updated: 12/13/2005 Hysterectomy- secondary to endometriosis  Family History: Last  updated: 2008-09-14 Father died of unknown cause  Mother died with alzheimer's Siblings with DM, no known CAD  Social History: Last updated: 07/19/2008 No smoking,  quit etoh this year, relapsed, reports sober 07/19/08 no illegal drugs.   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  Risk Factors: Alcohol Use: 0 (10/11/2009) Exercise: no (10/11/2009)  Risk Factors: Smoking Status: never (10/11/2009)  Review of Systems       per HPI  Physical Exam  General:  alert, well-developed, well-hydrated, and overweight-appearing.   Head:  normocephalic and atraumatic.   Eyes:  vision grossly intact, pupils equal, pupils round, and pupils reactive to light.   Ears:  R ear normal and L ear normal.   Nose:  no external deformity, nasal dischargemucosal pallor, mucosal erythema, airflow obstruction, L frontal sinus  tenderness, L maxillary sinus tenderness, R frontal sinus tenderness, and R maxillary sinus tenderness.   Mouth:  good dentition, no gingival abnormalities, no dental plaque, and pharynx pink and moist.   Neck:  supple, full ROM, and no masses.   Lungs:  wheezing throughout bilaterallyno intercostal retractions, no accessory muscle use, no dullness, and no crackles.   Heart:  normal rate, regular rhythm, no murmur, no gallop, and no rub.   Abdomen:  normal bowel sounds.   Msk:  No deformity or scoliosis noted of thoracic or lumbar spine.   Walks with a difficuluty with a walker.decreased ROM and joint tenderness to both knees. Pulses:  R and L carotid,radial,femoral,dorsalis pedis and posterior tibial pulses are full and equal bilaterally Extremities:  2+ left pedal edema and 2+ right pedal edema.   Neurologic:  alert & oriented X3, cranial nerves II-XII intact, strength normal in all extremities, sensation intact to light touch, and sensation intact to pinprick.   Skin:  color normal, no rashes, and no petechiae.   Psych:  Oriented X3, flat affect, and poor eye contact.      Impression & Recommendations:  Problem # 1:  INTRINSIC ASTHMA, WITH EXACERBATION (EAV-409.81) Patient had PFT's done on 10/04/2009. Will await for a report. Stable at present. Will continue with current regimen. Her updated medication list for this problem includes:    Ventolin Hfa 108 (90 Base) Mcg/act Aers (Albuterol sulfate) .Marland Kitchen... Take two puffs by mouth every 6 hours  as needed    Qvar 40 Mcg/act Aers (Beclomethasone dipropionate) .Marland Kitchen..Marland Kitchen Two puffs two times a day as needed    Albuterol Sulfate (2.5 Mg/82ml) 0.083% Nebu (Albuterol sulfate) ..... Use one vial per nebulizer treatemnt q 6 hours as needed    Ipratropium Bromide 0.02 % Soln (Ipratropium bromide) ..... Use one vial with albuterol 6 hours as needed with nebulizer  Pulmonary Functions Reviewed: O2 sat: 97 (10/11/2009)  Problem # 2:  PAIN IN JOINT, MULTIPLE SITES (ICD-719.49) Continue with home exercises. Rx for a shower chair and a raised toilet seat sent to the pharmacy.  Problem # 3:  HYPERTENSION (ICD-401.9) Patient states that did not take her Amlodipine this am yet. Weight managment and low-salt diet discussed. Risks of HTN reviewed with the patient. Her updated medication list for this problem includes:    Carvedilol 25 Mg Tabs (Carvedilol) .Marland Kitchen... Take 1 tablet twice a day.    Amlodipine Besylate 10 Mg Tabs (Amlodipine besylate) .Marland Kitchen... Take 1 tablet by mouth once a day    Furosemide 20 Mg Tabs (Furosemide) .Marland Kitchen... Take 1 tablet by mouth once a day in the morning  BP today: 139/90 Prior BP: 151/99 (09/27/2009)  Prior 10 Yr Risk Heart Disease: N/A (07/19/2008)  Labs Reviewed: K+: 4.3 (07/29/2009) Creat: : 1.29 (07/29/2009)   Chol: 136 (07/29/2009)   HDL: 32 (07/29/2009)   LDL: 87 (07/29/2009)   TG: 85 (07/29/2009)  Problem # 4:  DEPRESSION (ICD-311)  Stable and w/o SI/HI or mania. Sees a psychiatrist on a regular basis --next appointment, this coming Thursday. Her updated medication list for this problem includes:     Sertraline Hcl 100 Mg Tabs (Sertraline hcl) .Marland Kitchen... Take 1 tablet by mouth once a day  Discussed treatment options, including trial of antidpressant medication. Will refer to behavioral health. Follow-up call in in 24-48 hours and recheck in 2 weeks, sooner as needed. Patient agrees to call if any worsening of symptoms or thoughts of doing harm arise. Verified that the patient has no  suicidal ideation at this time.   Complete Medication List: 1)  Tramadol Hcl 50 Mg Tabs (Tramadol hcl) .... Take 1 tablet by mouth four times a day 2)  Carvedilol 25 Mg Tabs (Carvedilol) .... Take 1 tablet twice a day. 3)  Bayer Aspirin 325 Mg Tabs (Aspirin) .... Take 1 tablet by mouth once a day 4)  Gabapentin 300 Mg Caps (Gabapentin) .... Taper up to three tabs three times a day 5)  Allopurinol 300 Mg Tabs (Allopurinol) .Marland Kitchen.. 1 tab daily 6)  Ventolin Hfa 108 (90 Base) Mcg/act Aers (Albuterol sulfate) .... Take two puffs by mouth every 6 hours  as needed 7)  Zocor 40 Mg Tabs (Simvastatin) .... Take 1 tab by mouth at bedtime 8)  Omeprazole 40 Mg Cpdr (Omeprazole) .... Take 1 tablet by mouth two times a day 9)  Amlodipine Besylate 10 Mg Tabs (Amlodipine besylate) .... Take 1 tablet by mouth once a day 10)  Nitrostat 0.3 Mg Subl (Nitroglycerin) .... Take one tablet for chest pain as needed. may repeat tw more times 5 minutes apart. 11)  Sertraline Hcl 100 Mg Tabs (Sertraline hcl) .... Take 1 tablet by mouth once a day 12)  Lamotrigine 25 Mg Tabs (Lamotrigine) .... Tapper 13)  Nebulizer/adult Mask Kit (Respiratory therapy supplies) .... Use with albuterol and ipratropium q 6 hrs as needed 14)  Nebulizer Misc (Nebulizers) .... Use with albuterol and impratropium q 6 hours as needed 15)  Qvar 40 Mcg/act Aers (Beclomethasone dipropionate) .... Two puffs two times a day as needed 16)  Albuterol Sulfate (2.5 Mg/2ml) 0.083% Nebu (Albuterol sulfate) .... Use one vial per nebulizer treatemnt q 6 hours as needed 17)  Ipratropium  Bromide 0.02 % Soln (Ipratropium bromide) .... Use one vial with albuterol 6 hours as needed with nebulizer 18)  Furosemide 20 Mg Tabs (Furosemide) .... Take 1 tablet by mouth once a day in the morning 19)  Aerogear Action Asthma Kit Kit (Peak flow meter-inh assist dev) .... Use with all your inhalers as instructed 20)  Depend Overnight Briefs Large Misc (Incontinence supply disposable) .... Use as directed as needed 21)  Padsorber Bed Pan Liners Misc (Incontinence supply disposable) .... Use as needed 22)  Round Shower Stool Misc (Misc. devices) .... Use while taking showers to prevent falls 23)  E-z Lock Raised Toilet Seat Misc (Misc. devices) .... Please, use as directed  Patient Instructions: 1)  [facility], call [facility] and [facility] shower chair and raised toilet seat. 2)  Call with any questions. 3)  Please, follow up in 3 months. Prescriptions: E-Z LOCK RAISED TOILET SEAT  MISC (MISC. DEVICES) Please, use as directed  #1 x 0   Entered and Authorized by:   [name]   Signed by:   [name] on 10/11/2009   Method used:   [facility]ronically to        [facility] [phone]* (retail)       [address], [place]  [address]       Ph: [phone] or [phone]       Fax: [phone]   RxID:   [id number]    Prevention & Chronic Care Immunizations   Influenza vaccine: Not documented   Influenza vaccine deferral: Refused  (11/24/2008)    Tetanus booster: Not documented   Td booster deferral: Not indicated  (02/02/2009)   Tetanus booster due: 02/03/2019    Pneumococcal vaccine: Not documented  Colorectal  Screening   Hemoccult: Not documented    Colonoscopy: Not documented   Colonoscopy action/deferral: Deferred  (11/24/2008)  Other Screening   Pap smear: NEGATIVE FOR INTRAEPITHELIAL LESIONS OR MALIGNANCY.  (11/24/2008)   Pap smear action/deferral: Ordered  (11/24/2008)    Mammogram: ASSESSMENT: Negative - BI-RADS 1^MM DIGITAL SCREENING   (11/17/2008)   Mammogram action/deferral: Ordered  (11/11/2008)   Smoking status: never  (10/11/2009)  Lipids   Total Cholesterol: 136  (07/29/2009)   Lipid panel action/deferral: Lipid Panel ordered   LDL: 87  (07/29/2009)   LDL Direct: Not documented   HDL: 32  (07/29/2009)   Triglycerides: 85  (07/29/2009)    SGOT (AST): 15  (07/29/2009)   BMP action: Ordered   SGPT (ALT): 9  (07/29/2009)   Alkaline phosphatase: 137  (07/29/2009)   Total bilirubin: 0.5  (07/29/2009)  Hypertension   Last Blood Pressure: 139 / 90  (10/11/2009)   Serum creatinine: 1.29  (07/29/2009)   BMP action: Ordered   Serum potassium 4.3  (07/29/2009)  Self-Management Support :   Personal Goals (by the next clinic visit) :      Personal blood pressure goal: 140/90  (05/31/2009)     Personal LDL goal: 100  (05/31/2009)    Patient will work on the following items until the next clinic visit to reach self-care goals:     Medications and monitoring: take my medicines every day, bring all of my medications to every visit  (10/11/2009)     Eating: use fresh or frozen vegetables, eat foods that are low in salt, eat baked foods instead of fried foods  (10/11/2009)     Activity: take a 30 minute walk every day  (09/27/2009)     Other: walked with grand daughter but started wheezing but had to stop  (12/21/2008)    Hypertension self-management support: Written self-care plan  (09/27/2009)    Lipid self-management support: Written self-care plan  (09/27/2009)    Appended Document: EST-2 WEEK RECHECK/CH Request for shower chair/ raised toilet seat faxed to [facility].

## 2010-03-28 NOTE — Assessment & Plan Note (Signed)
Summary: 3:30 appt/ f/u labs and plan of care/pcp-Vanessa Palmer/hla   Vital Signs:  Patient profile:   60 year old female Height:      62 inches (157.48 cm) Weight:      289.7 pounds (131.68 kg) BMI:     53.18 Temp:     97.0 degrees F (36.11 degrees C) oral Pulse rate:   67 / minute BP sitting:   145 / 104  (right arm)  Vitals Entered By: Cynda Familia Duncan Dull) (August 23, 2009 3:26 PM) CC: routine f/u Is Patient Diabetic? No  Does patient need assistance? Functional Status Self care Ambulation Normal   Primary Care Provider:  [name]  CC:  routine f/u.  History of Present Illness: 1.Follow up from ED for acute abdominal pain on June 24th, 2011. Had a CT scan -neg for pathology. Was Rx-ed Ultram and Phenergan. Patient did not have money to refill her medications. Patient continues to have generalized abdominal pain, described as spasms. Started with Nausea and vomitting on 08/18/2009. Pain is 8/10 scale;feels nauseated. Denies fever, chills, diarrhea, melena or any other symptoms. Abdominal pain usually starts after PO intake. Pain is constant. C/o of having it at present; 6/10 in intensity. Patient denies having similar Sx in the past. 2. Patient is "depressed." Denies any SI/HI or mania. Patient is "tired" because cannot work "because of her back" and has a difficulty to get a disability. Has a trouble reaching her lawyer. Patient reports having active thoughts of "hurting herlself." Refuses to elaborate.   Depression History:      The patient comes in today for recurrence of depressive symptoms.  The patient is having a depressed mood most of the day and has a diminished interest in her usual daily activities.  Positive alarm features for depression include significant weight gain, psychomotor retardation, fatigue (loss of energy), feelings of worthlessness (guilt), impaired concentration (indecisiveness), and recurrent thoughts of death or suicide.  The patient denies  symptoms of a manic disorder including persistently & abnormally elevated mood, abnormally & persistently irritable mood, less need for sleep, talkative or feels need to keep talking, distractibility, flight of ideas, increase in goal-directed activity, psychomotor agitation, inflated self-esteem or grandiosity, excessive buying sprees, excessive sexual indiscretions, and excessive foolish business investments.        Psychosocial stress factors include major life changes.  Risk factors for depression include a personal history of depression.  Suicide risk questions reveal that she wishes that she were dead and she has thought about ending her life.  The patient denies that she feels like life is not worth living.  Due to her current symptoms, it often takes extra effort to do the things she needs to do.         Preventive Screening-Counseling & Management  Alcohol-Tobacco     Alcohol drinks/day: 0     Smoking Status: never  Problems Prior to Update: 1)  Alkaline Phosphatase, Elevated  (ICD-790.5) 2)  Sinusitis, Acute  (ICD-461.9) 3)  Dizziness  (ICD-780.4) 4)  Wheezing  (ICD-786.07) 5)  Pain in Joint, Multiple Sites  (ICD-719.49) 6)  Fall, Hx of  (ICD-V15.88) 7)  Osteoarthritis, Sacroiliac Joint  (ICD-715.98) 8)  Tinea Pedis  (ICD-110.4) 9)  Back Pain  (ICD-724.5) 10)  Back Pain  (ICD-724.5) 11)  Hemoccult Positive Stool  (ICD-578.1) 12)  Major Depressive Disorder Single Episode Mild  (ICD-296.21) 13)  Cellulitis, Foot, Right  (ICD-682.7) 14)  Foot Pain, Right  (ICD-729.5) 15)  Gout  (ICD-274.9) 16)  Bronchopneumonia Organism Unspecified  (ICD-485) 17)  Preventive Health Care  (ICD-V70.0) 18)  Knee Pain  (ICD-719.46) 19)  Renal Insufficiency, Acute  (ICD-585.9) 20)  Low Back Pain, Acute  (ICD-724.2) 21)  Adverse Side Effect, Diuretic  (ICD-E944.4) 22)  Metabolic Acidosis  (ICD-276.2) 23)  Headache  (ICD-784.0) 24)  Muscle Cramps  (ICD-729.82) 25)  Health Screening   (ICD-V70.0) 26)  Aftercare, Long-term Use, Medications Nec  (ICD-V58.69) 27)  Hyperlipidemia  (ICD-272.4) 28)  Chest Pain  (ICD-786.50) 29)  Sleep Apnea, Obstructive  (ICD-327.23) 30)  Coronary Artery Disease  (ICD-414.00) 31)  Hyperglycemia  (ICD-790.6) 32)  Alcohol Abuse  (ICD-305.00) 33)  Morbid Obesity  (ICD-278.01) 34)  Hypertension  (ICD-401.9) 35)  Anxiety  (ICD-300.00)  Current Problems (verified): 1)  Alkaline Phosphatase, Elevated  (ICD-790.5) 2)  Sinusitis, Acute  (ICD-461.9) 3)  Dizziness  (ICD-780.4) 4)  Wheezing  (ICD-786.07) 5)  Pain in Joint, Multiple Sites  (ICD-719.49) 6)  Fall, Hx of  (ICD-V15.88) 7)  Osteoarthritis, Sacroiliac Joint  (ICD-715.98) 8)  Tinea Pedis  (ICD-110.4) 9)  Back Pain  (ICD-724.5) 10)  Back Pain  (ICD-724.5) 11)  Hemoccult Positive Stool  (ICD-578.1) 12)  Major Depressive Disorder Single Episode Mild  (ICD-296.21) 13)  Cellulitis, Foot, Right  (ICD-682.7) 14)  Foot Pain, Right  (ICD-729.5) 15)  Gout  (ICD-274.9) 16)  Bronchopneumonia Organism Unspecified  (ICD-485) 17)  Preventive Health Care  (ICD-V70.0) 18)  Knee Pain  (ICD-719.46) 19)  Renal Insufficiency, Acute  (ICD-585.9) 20)  Low Back Pain, Acute  (ICD-724.2) 21)  Adverse Side Effect, Diuretic  (ICD-E944.4) 22)  Metabolic Acidosis  (ICD-276.2) 23)  Headache  (ICD-784.0) 24)  Muscle Cramps  (ICD-729.82) 25)  Health Screening  (ICD-V70.0) 26)  Aftercare, Long-term Use, Medications Nec  (ICD-V58.69) 27)  Hyperlipidemia  (ICD-272.4) 28)  Chest Pain  (ICD-786.50) 29)  Sleep Apnea, Obstructive  (ICD-327.23) 30)  Coronary Artery Disease  (ICD-414.00) 31)  Hyperglycemia  (ICD-790.6) 32)  Alcohol Abuse  (ICD-305.00) 33)  Morbid Obesity  (ICD-278.01) 34)  Hypertension  (ICD-401.9) 35)  Anxiety  (ICD-300.00)  Medications Prior to Update: 1)  Tramadol Hcl 50 Mg Tabs (Tramadol Hcl) .... Take 1 Tablet By Mouth Four Times A Day 2)  Carvedilol 25 Mg Tabs (Carvedilol) .... Take 1  Tablet Twice A Day. 3)  Bayer Aspirin 325 Mg Tabs (Aspirin) .... Take 1 Tablet By Mouth Once A Day 4)  Gabapentin 300 Mg Caps (Gabapentin) .... Taper Up To Three Tabs Three Times A Day 5)  Allopurinol 300 Mg Tabs (Allopurinol) .Marland Kitchen.. 1 Tab Daily 6)  Ventolin Hfa 108 (90 Base) Mcg/act Aers (Albuterol Sulfate) 7)  Zocor 40 Mg Tabs (Simvastatin) .... Take 1 Tab By Mouth At Bedtime 8)  Omeprazole 40 Mg Cpdr (Omeprazole) .... Take 1 Tablet By Mouth Two Times A Day 9)  Amlodipine Besylate 5 Mg Tabs (Amlodipine Besylate) .... Take 1 Tablet By Mouth Two Times A Day 10)  Nitrostat 0.3 Mg Subl (Nitroglycerin) .... Take One Tablet For Chest Pain As Needed. May Repeat Tw More Times 5 Minutes Apart. 11)  Zoloft 50 Mg Tabs (Sertraline Hcl) .... Take 1/2 By Mouth Daily For 7 Days Then One Tablet Daily  Current Medications (verified): 1)  Tramadol Hcl 50 Mg Tabs (Tramadol Hcl) .... Take 1 Tablet By Mouth Four Times A Day 2)  Carvedilol 25 Mg Tabs (Carvedilol) .... Take 1 Tablet Twice A Day. 3)  Bayer Aspirin 325 Mg Tabs (Aspirin) .... Take 1 Tablet By Mouth Once  A Day 4)  Gabapentin 300 Mg Caps (Gabapentin) .... Taper Up To Three Tabs Three Times A Day 5)  Allopurinol 300 Mg Tabs (Allopurinol) .Marland Kitchen.. 1 Tab Daily 6)  Ventolin Hfa 108 (90 Base) Mcg/act Aers (Albuterol Sulfate) 7)  Zocor 40 Mg Tabs (Simvastatin) .... Take 1 Tab By Mouth At Bedtime 8)  Omeprazole 40 Mg Cpdr (Omeprazole) .... Take 1 Tablet By Mouth Two Times A Day 9)  Amlodipine Besylate 5 Mg Tabs (Amlodipine Besylate) .... Take 1 Tablet By Mouth Two Times A Day 10)  Nitrostat 0.3 Mg Subl (Nitroglycerin) .... Take One Tablet For Chest Pain As Needed. May Repeat Tw More Times 5 Minutes Apart. 11)  Zoloft 50 Mg Tabs (Sertraline Hcl) .... Take 1/2 By Mouth Daily For 7 Days Then One Tablet Daily  Allergies: 1)  ! Ace Inhibitors 2)  ! Darvocet 3)  ! * Dilaudid  Directives (verified): 1)  Full Code   Past History:  Family History: Last updated:  2008-09-29 Father died of unknown cause  Mother died with alzheimer's Siblings with DM, no known CAD  Social History: Last updated: 07/19/2008 No smoking,  quit etoh this year, relapsed, reports sober 07/19/08 no illegal drugs.   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  Risk Factors: Alcohol Use: 0 (08/23/2009) Exercise: no (07/27/2009)  Risk Factors: Smoking Status: never (08/23/2009)  Past medical, surgical, family and social histories (including risk factors) reviewed for relevance to current acute and chronic problems.  Past Medical History: Reviewed history from 07/19/2008 and no changes required. Morbid obesity Hypertension Hyperlipidemia Coronary artery disease- non-Q wave MI's in '04, '05;     cath 9/06 with 10-15% left circumflex, diffuse OM and RCA disease.      Myoview in 05/08: Fixed defect at the apex and inferior wall.  No stress inducedischemia. Sleep apnea- mild to moderate per sleep study 3/07 Anxiety Alcohol abuse- sober since 11/06?, Relapsed (EtOH 134 on 06/12/08) GERD Gout (clinical Dx. No arthrocentesis.) S/p hysterectomy. Chronic renal insufficiency (baseline Cr 1.2-1.6)  Past Surgical History: Reviewed history from 12/13/2005 and no changes required. Hysterectomy- secondary to endometriosis  Family History: Reviewed history from 09-29-08 and no changes required. Father died of unknown cause  Mother died with alzheimer's Siblings with DM, no known CAD  Social History: Reviewed history from 07/19/2008 and no changes required. No smoking,  quit etoh this year, relapsed, reports sober 07/19/08 no illegal drugs.   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  Review of Systems       per HPI  Physical Exam  General:  alert, well-developed, well-hydrated, and overweight-appearing.   Head:  normocephalic and atraumatic.   Eyes:  vision grossly intact, pupils equal, pupils round, and pupils reactive to light.   Ears:  R ear normal and L ear  normal.   Nose:  no external deformity, nasal dischargemucosal pallor, mucosal erythema, airflow obstruction, L frontal sinus tenderness, L maxillary sinus tenderness, R frontal sinus tenderness, and R maxillary sinus tenderness.   Mouth:  good dentition, no gingival abnormalities, no dental plaque, and pharynx pink and moist.   Neck:  supple, full ROM, and no masses.   Lungs:  normal respiratory effort, no accessory muscle use, normal breath sounds, no crackles, and no wheezes.   Heart:  normal rate, regular rhythm, no gallop, and no rub.  grade 1/6 systolic murmur Abdomen:  normal bowel sounds.   Msk:  Pain to palpation on her knees, no redness, no swelling and no warmth. Range  of motion is mild limited and she has bilateral crepitation. Pulses:  R and L carotid,radial,femoral,dorsalis pedis and posterior tibial pulses are full and equal bilaterally Extremities:  +1 lower extremity edema bilaterally Neurologic:  alert & oriented X3.   Skin:  small papules on bilateral upper extremities with excoriations from scratching. Psych:  Oriented X3, depressed affect, withdrawn, poor eye contact, tearful, poor concentration, memory impairment, and judgment poor.     Impression & Recommendations:  Problem # 1:  MAJOR DEPRESSIVE DISORDER SINGLE EPISODE MILD (ICD-296.21)  Patient expresses SI. Called ED and notified of the patient's arrival. [name] (RN) is to accompany the patient to ED for a Psych evlauation and admission STAT.  Orders: T-CMP with Estimated GFR ([id number]) T-Drug Screen-Urine, (single) ([id number]) T-TSH [id number]) T- * Misc. Laboratory test [phone])  Problem # 2:  HYPERTENSION (ICD-401.9)  Denies HA, visual/speech deficits or unilateral weakness. Will increase Amlodipine to 10 mg by mouth daily. Her updated medication list for this problem includes:    Carvedilol 25 Mg Tabs (Carvedilol) .Marland Kitchen... Take 1 tablet twice a day.    Amlodipine Besylate 10 Mg Tabs (Amlodipine  besylate) .Marland Kitchen... Take 1 tablet by mouth once a day  BP today: 145/104 Prior BP: 158/105 (07/27/2009)  Prior 10 Yr Risk Heart Disease: N/A (07/19/2008)  Labs Reviewed: K+: 4.3 (07/29/2009) Creat: : 1.29 (07/29/2009)   Chol: 136 (07/29/2009)   HDL: 32 (07/29/2009)   LDL: 87 (07/29/2009)   TG: 85 (07/29/2009)  Problem # 3:  MAJOR DEPRESSIVE DISORDER SINGLE EPISODE MILD (ICD-296.21)  Complete Medication List: 1)  Tramadol Hcl 50 Mg Tabs (Tramadol hcl) .... Take 1 tablet by mouth four times a day 2)  Carvedilol 25 Mg Tabs (Carvedilol) .... Take 1 tablet twice a day. 3)  Bayer Aspirin 325 Mg Tabs (Aspirin) .... Take 1 tablet by mouth once a day 4)  Gabapentin 300 Mg Caps (Gabapentin) .... Taper up to three tabs three times a day 5)  Allopurinol 300 Mg Tabs (Allopurinol) .Marland Kitchen.. 1 tab daily 6)  Ventolin Hfa 108 (90 Base) Mcg/act Aers (Albuterol sulfate) 7)  Zocor 40 Mg Tabs (Simvastatin) .... Take 1 tab by mouth at bedtime 8)  Omeprazole 40 Mg Cpdr (Omeprazole) .... Take 1 tablet by mouth two times a day 9)  Amlodipine Besylate 10 Mg Tabs (Amlodipine besylate) .... Take 1 tablet by mouth once a day 10)  Nitrostat 0.3 Mg Subl (Nitroglycerin) .... Take one tablet for chest pain as needed. may repeat tw more times 5 minutes apart. 11)  Sertraline Hcl 100 Mg Tabs (Sertraline hcl) .... Take 1 tablet by mouth once a day  Other Orders: Social Work Referral (Social ) Prescriptions: SERTRALINE HCL 100 MG TABS (SERTRALINE HCL) Take 1 tablet by mouth once a day  #30 x 11   Entered and Authorized by:   Vanessa Robinson MD   Signed by:   Vanessa Robinson MD on 08/23/2009   Method used:   Electronically to        [facility] [phone]* (retail)       [address] [place]  [address]       Ph: [phone] or [phone]       Fax: [phone]   RxID:   [id number] AMLODIPINE BESYLATE 10 MG TABS (AMLODIPINE BESYLATE) Take 1 tablet by mouth once a day  #30 x 11   Entered and Authorized  by:   Vanessa Robinson MD   Signed by:  Vanessa Robinson MD on 08/23/2009   Method used:   [facility] to        [facility] [phone][facility] (retail)       [address]  [address]       Ph: [phone] or [phone]       Fax: [phone]   RxID:   [id number]  Process Orders Check Orders Results:     [facility]: ABN not required for this insurance Tests Sent for requisitioning (August 23, 2009 4:46 PM):     08/23/2009: [facility] -- T-CMP with Estimated GFR [[id number]] (signed)     08/23/2009: [facility] -- T-Drug Screen-Urine, (single) [[id number]] (signed)     08/23/2009: [facility] -- T-TSH [phone] (signed)     08/23/2009: [facility] -- T- * Misc. Laboratory test [phone] (signed)

## 2010-03-28 NOTE — Assessment & Plan Note (Signed)
Summary: EST-CK/FU/MEDS/CFB   Vital Signs:  Patient profile:   60 year old female Height:      62 inches (157.48 cm) Weight:      295.3 pounds (134.23 kg) BMI:     54.21 O2 Sat:      97 % on Room air Temp:     97.8 degrees F (36.56 degrees C) oral Pulse rate:   72 / minute BP sitting:   151 / 99  (right arm)  Vitals Entered By: Chinita Pester RN (September 27, 2009 9:01 AM)  O2 Flow:  Room air CC: F/U visit.Needs refill on pai med.  Feet swollen. Audible wheezes; coughing up yellow phlegm since Saturday. Is Patient Diabetic? No Pain Assessment Patient in pain? yes     Location: back Intensity: 8 Type: aching Onset of pain  Intermittent Nutritional Status BMI of > 30 = obese  Have you ever been in a relationship where you felt threatened, hurt or afraid?No   Does patient need assistance? Functional Status Self care Ambulation Impaired:Risk for fall Comments uses rolling walker   Primary Care Provider:  [name]  CC:  F/U visit.Needs refill on pai med.  Feet swollen. Audible wheezes; coughing up yellow phlegm since Saturday.Marland Kitchen  History of Present Illness: Follow up appointment on: 1. HTN 2. Pedal edema 3. depression.  Depression History:      The patient denies a depressed mood most of the day and a diminished interest in her usual daily activities.         Preventive Screening-Counseling & Management  Alcohol-Tobacco     Alcohol drinks/day: 0     Smoking Status: never  Caffeine-Diet-Exercise     Does Patient Exercise: no  Current Problems (verified): 1)  Alkaline Phosphatase, Elevated  (ICD-790.5) 2)  Sinusitis, Acute  (ICD-461.9) 3)  Dizziness  (ICD-780.4) 4)  Wheezing  (ICD-786.07) 5)  Pain in Joint, Multiple Sites  (ICD-719.49) 6)  Fall, Hx of  (ICD-V15.88) 7)  Osteoarthritis, Sacroiliac Joint  (ICD-715.98) 8)  Tinea Pedis  (ICD-110.4) 9)  Back Pain  (ICD-724.5) 10)  Back Pain  (ICD-724.5) 11)  Hemoccult Positive Stool  (ICD-578.1) 12)   Major Depressive Disorder Single Episode Mild  (ICD-296.21) 13)  Cellulitis, Foot, Right  (ICD-682.7) 14)  Foot Pain, Right  (ICD-729.5) 15)  Gout  (ICD-274.9) 16)  Bronchopneumonia Organism Unspecified  (ICD-485) 17)  Preventive Health Care  (ICD-V70.0) 18)  Knee Pain  (ICD-719.46) 19)  Renal Insufficiency, Acute  (ICD-585.9) 20)  Low Back Pain, Acute  (ICD-724.2) 21)  Adverse Side Effect, Diuretic  (ICD-E944.4) 22)  Metabolic Acidosis  (ICD-276.2) 23)  Headache  (ICD-784.0) 24)  Muscle Cramps  (ICD-729.82) 25)  Health Screening  (ICD-V70.0) 26)  Aftercare, Long-term Use, Medications Nec  (ICD-V58.69) 27)  Hyperlipidemia  (ICD-272.4) 28)  Chest Pain  (ICD-786.50) 29)  Sleep Apnea, Obstructive  (ICD-327.23) 30)  Coronary Artery Disease  (ICD-414.00) 31)  Hyperglycemia  (ICD-790.6) 32)  Alcohol Abuse  (ICD-305.00) 33)  Morbid Obesity  (ICD-278.01) 34)  Hypertension  (ICD-401.9) 35)  Anxiety  (ICD-300.00)  Medications Prior to Update: 1)  Tramadol Hcl 50 Mg Tabs (Tramadol Hcl) .... Take 1 Tablet By Mouth Four Times A Day 2)  Carvedilol 25 Mg Tabs (Carvedilol) .... Take 1 Tablet Twice A Day. 3)  Bayer Aspirin 325 Mg Tabs (Aspirin) .... Take 1 Tablet By Mouth Once A Day 4)  Gabapentin 300 Mg Caps (Gabapentin) .... Taper Up To Three Tabs Three Times A Day 5)  Allopurinol 300 Mg Tabs (Allopurinol) .Marland Kitchen.. 1 Tab Daily 6)  Ventolin Hfa 108 (90 Base) Mcg/act Aers (Albuterol Sulfate) .... Take Two Puffs By Mouth Every 6 Hours  As Needed 7)  Zocor 40 Mg Tabs (Simvastatin) .... Take 1 Tab By Mouth At Bedtime 8)  Omeprazole 40 Mg Cpdr (Omeprazole) .... Take 1 Tablet By Mouth Two Times A Day 9)  Amlodipine Besylate 10 Mg Tabs (Amlodipine Besylate) .... Take 1 Tablet By Mouth Once A Day 10)  Nitrostat 0.3 Mg Subl (Nitroglycerin) .... Take One Tablet For Chest Pain As Needed. May Repeat Tw More Times 5 Minutes Apart. 11)  Sertraline Hcl 100 Mg Tabs (Sertraline Hcl) .... Take 1 Tablet By Mouth Once A  Day  Current Medications (verified): 1)  Tramadol Hcl 50 Mg Tabs (Tramadol Hcl) .... Take 1 Tablet By Mouth Four Times A Day 2)  Carvedilol 25 Mg Tabs (Carvedilol) .... Take 1 Tablet Twice A Day. 3)  Bayer Aspirin 325 Mg Tabs (Aspirin) .... Take 1 Tablet By Mouth Once A Day 4)  Gabapentin 300 Mg Caps (Gabapentin) .... Taper Up To Three Tabs Three Times A Day 5)  Allopurinol 300 Mg Tabs (Allopurinol) .Marland Kitchen.. 1 Tab Daily 6)  Ventolin Hfa 108 (90 Base) Mcg/act Aers (Albuterol Sulfate) .... Take Two Puffs By Mouth Every 6 Hours  As Needed 7)  Zocor 40 Mg Tabs (Simvastatin) .... Take 1 Tab By Mouth At Bedtime 8)  Omeprazole 40 Mg Cpdr (Omeprazole) .... Take 1 Tablet By Mouth Two Times A Day 9)  Amlodipine Besylate 10 Mg Tabs (Amlodipine Besylate) .... Take 1 Tablet By Mouth Once A Day 10)  Nitrostat 0.3 Mg Subl (Nitroglycerin) .... Take One Tablet For Chest Pain As Needed. May Repeat Tw More Times 5 Minutes Apart. 11)  Sertraline Hcl 100 Mg Tabs (Sertraline Hcl) .... Take 1 Tablet By Mouth Once A Day  Allergies (verified): 1)  ! Ace Inhibitors 2)  ! Darvocet 3)  ! * Dilaudid  Directives (verified): 1)  Full Code   Past History:  Past Medical History: Last updated: 07/19/2008 Morbid obesity Hypertension Hyperlipidemia Coronary artery disease- non-Q wave MI's in '04, '05;     cath 9/06 with 10-15% left circumflex, diffuse OM and RCA disease.      Myoview in 05/08: Fixed defect at the apex and inferior wall.  No stress inducedischemia. Sleep apnea- mild to moderate per sleep study 3/07 Anxiety Alcohol abuse- sober since 11/06?, Relapsed (EtOH 134 on 06/12/08) GERD Gout (clinical Dx. No arthrocentesis.) S/p hysterectomy. Chronic renal insufficiency (baseline Cr 1.2-1.6)  Past Surgical History: Last updated: 12/13/2005 Hysterectomy- secondary to endometriosis  Family History: Last updated: 10/05/2008 Father died of unknown cause  Mother died with alzheimer's Siblings with DM, no  known CAD  Social History: Last updated: 07/19/2008 No smoking,  quit etoh this year, relapsed, reports sober 07/19/08 no illegal drugs.   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  Risk Factors: Smoking Status: never (09/27/2009)  Family History: Reviewed history from 05-Oct-2008 and no changes required. Father died of unknown cause  Mother died with alzheimer's Siblings with DM, no known CAD  Social History: Reviewed history from 07/19/2008 and no changes required. No smoking,  quit etoh this year, relapsed, reports sober 07/19/08 no illegal drugs.   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  Review of Systems       per HPI  Physical Exam  General:  alert, well-developed, well-hydrated, and overweight-appearing.   Head:  normocephalic and atraumatic.  Eyes:  vision grossly intact, pupils equal, pupils round, and pupils reactive to light.   Ears:  R ear normal and L ear normal.   Nose:  no external deformity, nasal dischargemucosal pallor, mucosal erythema, airflow obstruction, L frontal sinus tenderness, L maxillary sinus tenderness, R frontal sinus tenderness, and R maxillary sinus tenderness.   Mouth:  good dentition, no gingival abnormalities, no dental plaque, and pharynx pink and moist.   Neck:  supple, full ROM, and no masses.   Lungs:  wheezing throughout bilaterallyno intercostal retractions, no accessory muscle use, no dullness, and no crackles.   Heart:  normal rate, regular rhythm, no murmur, no gallop, and no rub.   Abdomen:  normal bowel sounds.   Msk:  No deformity or scoliosis noted of thoracic or lumbar spine.   Pulses:  R and L carotid,radial,femoral,dorsalis pedis and posterior tibial pulses are full and equal bilaterally Extremities:  2+ left pedal edema and 2+ right pedal edema.   Neurologic:  alert & oriented X3, cranial nerves II-XII intact, strength normal in all extremities, sensation intact to light touch, and sensation intact to pinprick.   Skin:   color normal, no rashes, and no petechiae.   Cervical Nodes:  No lymphadenopathy noted Axillary Nodes:  No palpable lymphadenopathy Inguinal Nodes:  No significant adenopathy Psych:  Oriented X3, memory intact for recent and remote, normally interactive, good eye contact, not anxious appearing, not depressed appearing, not agitated, not suicidal, and not homicidal.      Impression & Recommendations:  Problem # 1:  PEDAL EDEMA (ICD-782.3) ?CHF vs renal vs OSA.  Her updated medication list for this problem includes:    Furosemide 20 Mg Tabs (Furosemide) .Marland Kitchen... Take 1 tablet by mouth once a day in the morning  Orders: 2 D Echo (2 D Echo) T-Urine Microalbumin w/creat. ratio [phone]). Referred to [facility]  Discussed elevation of the legs, use of compression stockings, sodium restiction, and medication use.   Problem # 2:  INTRINSIC ASTHMA, WITH EXACERBATION (ICD-493.12) Unclear status of her RAD. Will order PFT to differentiate pattern; and CXR to evaluate for masses/lyphodenopathy or other lung disease. Her updated medication list for this problem includes:    Ventolin Hfa 108 (90 Base) Mcg/act Aers (Albuterol sulfate) .Marland Kitchen... Take two puffs by mouth every 6 hours  as needed    Flovent Hfa 44 Mcg/act Aero (Fluticasone propionate  hfa) .Marland Kitchen... 2 puffs two times a day    Albuterol Sulfate (2.5 Mg/75ml) 0.083% Nebu (Albuterol sulfate) ..... Use one vial per nebulizer treatemnt q 6 hours as needed    Ipratropium Bromide 0.02 % Soln (Ipratropium bromide) ..... Use one vial with albuterol 6 hours as needed with nebulizer  Orders: CXR- 2view (CXR) PFT Baseline-Pre/Post Bronchodiolator (PFT Baseline-Pre/Pos) T-Urinalysis ([id number])  Pulmonary Functions Reviewed: O2 sat: 97 (09/27/2009)  Problem # 3:  OSTEOARTHRITIS, SACROILIAC JOINT (ICD-715.98)  Controlled Her updated medication list for this problem includes:    Tramadol Hcl 50 Mg Tabs (Tramadol hcl) .Marland Kitchen... Take 1 tablet by mouth  four times a day    Bayer Aspirin 325 Mg Tabs (Aspirin) .Marland Kitchen... Take 1 tablet by mouth once a day  Discussed use of medications, application of heat or cold, and exercises.   Problem # 4:  MAJOR DEPRESSIVE DISORDER SINGLE EPISODE MILD (ICD-296.21) Recent Hx of admission for SI. Doing MUCH better. started on Lamotrigine per Dr. Cherrie Distance (Psych). Next appointment in 2 days. Denies any HI/SI or mania.   Problem # 5:  HYPERTENSION (ICD-401.9)  Uncontrolled. Will add Furosemide and follow up on renal fxn and electrolytes in 2 weeks. Her updated medication list for this problem includes:    Carvedilol 25 Mg Tabs (Carvedilol) .Marland Kitchen... Take 1 tablet twice a day.    Amlodipine Besylate 10 Mg Tabs (Amlodipine besylate) .Marland Kitchen... Take 1 tablet by mouth once a day    Furosemide 20 Mg Tabs (Furosemide) .Marland Kitchen... Take 1 tablet by mouth once a day in the morning  Orders: 12 Lead EKG (12 Lead EKG) 2 D Echo (2 D Echo) T-Urine Microalbumin w/creat. ratio ([phone])  BP today: 151/99 Prior BP: 145/104 (08/23/2009)  Prior 10 Yr Risk Heart Disease: N/A (07/19/2008)  Labs Reviewed: K+: 4.3 (07/29/2009) Creat: : 1.29 (07/29/2009)   Chol: 136 (07/29/2009)   HDL: 32 (07/29/2009)   LDL: 87 (07/29/2009)   TG: 85 (07/29/2009)  Complete Medication List: 1)  Tramadol Hcl 50 Mg Tabs (Tramadol hcl) .... Take 1 tablet by mouth four times a day 2)  Carvedilol 25 Mg Tabs (Carvedilol) .... Take 1 tablet twice a day. 3)  Bayer Aspirin 325 Mg Tabs (Aspirin) .... Take 1 tablet by mouth once a day 4)  Gabapentin 300 Mg Caps (Gabapentin) .... Taper up to three tabs three times a day 5)  Allopurinol 300 Mg Tabs (Allopurinol) .Marland Kitchen.. 1 tab daily 6)  Ventolin Hfa 108 (90 Base) Mcg/act Aers (Albuterol sulfate) .... Take two puffs by mouth every 6 hours  as needed 7)  Zocor 40 Mg Tabs (Simvastatin) .... Take 1 tab by mouth at bedtime 8)  Omeprazole 40 Mg Cpdr (Omeprazole) .... Take 1 tablet by mouth two times a day 9)  Amlodipine  Besylate 10 Mg Tabs (Amlodipine besylate) .... Take 1 tablet by mouth once a day 10)  Nitrostat 0.3 Mg Subl (Nitroglycerin) .... Take one tablet for chest pain as needed. may repeat tw more times 5 minutes apart. 11)  Sertraline Hcl 100 Mg Tabs (Sertraline hcl) .... Take 1 tablet by mouth once a day 12)  Lamotrigine 25 Mg Tabs (Lamotrigine) .... Tapper 13)  Nebulizer/adult Mask Kit (Respiratory therapy supplies) .... Use with albuterol and ipratropium q 6 hrs as needed 14)  Nebulizer Misc (Nebulizers) .... Use with albuterol and impratropium q 6 hours as needed 15)  Flovent Hfa 44 Mcg/act Aero (Fluticasone propionate  hfa) .... 2 puffs two times a day 16)  Albuterol Sulfate (2.5 Mg/66ml) 0.083% Nebu (Albuterol sulfate) .... Use one vial per nebulizer treatemnt q 6 hours as needed 17)  Ipratropium Bromide 0.02 % Soln (Ipratropium bromide) .... Use one vial with albuterol 6 hours as needed with nebulizer 18)  Furosemide 20 Mg Tabs (Furosemide) .... Take 1 tablet by mouth once a day in the morning 19)  Aerogear Action Asthma Kit Kit (Peak flow meter-inh assist dev) .... Use with all your inhalers as instructed  Other Orders: Nebulizer Tx (08657) Albuterol Sulfate Sol 1mg  unit dose (Q4696) CPAP/BIPAP (CPAP/BIPAP)  Patient Instructions: 1)  Please, follow up in 2 weeks or sooner. 2)  Call with any questions. Prescriptions: AEROGEAR ACTION ASTHMA KIT  KIT (PEAK FLOW METER-INH ASSIST DEV) Use with all your inhalers as instructed  #1 x 3   Entered and Authorized by:   [name]   Signed by:   [name] on 09/27/2009   Method used:   Electronically to        [facility] [phone]* (retail)       [address]  [place], [place]  [address]       Ph: [phone] or [phone]       Fax: [phone]   RxID:   [id number] FUROSEMIDE 20 MG TABS (FUROSEMIDE) Take 1 tablet by mouth once a day in the morning  #30 x 3   Entered and Authorized by:   [name]    Signed by:   [name] on 09/27/2009   Method used:   Electronically to        [facility]. [phone]* (retail)       [address] [place]  [address]       Ph: [phone] or [phone]       Fax: [phone]   RxID:   [id number] FUROSEMIDE 20 MG TABS (FUROSEMIDE) Take 1 tablet by mouth once a day in the morning  #30 x 0   Entered and Authorized by:   [name]   Signed by:   [name] on 09/27/2009   Method used:   [facility]ron[facility] to        [facility] [phone]* (retail)       [address] [place]  [address]       Ph: [phone] or [phone]       Fax: [phone]   RxID:   [id number] IPRATROPIUM BROMIDE 0.02 % SOLN (IPRATROPIUM BROMIDE) use one vial with albuterol 6 hours as needed with nebulizer  #1 x 11   Entered and Authorized by:   [name]   Signed by:   [name] on 09/27/2009   Method used:   [facility]ronically to        [facility] [phone]* (retail)       [address] [place]  [address]       Ph: [phone] or [phone]       Fax: [phone]   RxID:   [id number] ALBUTEROL SULFATE (2.5 MG/3ML) 0.083% NEBU (ALBUTEROL SULFATE) use one vial per nebulizer treatemnt q 6 hours as needed  #1 x 11   Entered and Authorized by:   [name]   Signed by:   [name] on 09/27/2009   Method used:   Electronically to        [facility]. [phone]* (retail)       [address] [place]  [address]       Ph: [phone] or [phone]       Fax: [phone]   RxID:   [id number] FLOVENT HFA 44 MCG/ACT AERO (FLUTICASONE PROPIONATE  HFA) 2 puffs two times a day  #1 x 11   Entered and Authorized by:   [name]   Signed by:   [name] on 09/27/2009   Method used:   [facility] to        [facility]. [phone]* (retail)       [address], [place]  [address]       Ph: [phone] or [phone]        Fax: [phone]   RxID:   [id number] NEBULIZER  MISC (NEBULIZERS) Use with albuterol and impratropium q 6 hours as needed  #1 x 1   Entered and Authorized by:  [name]   Signed by:   [name] on 09/27/2009   Method used:   [facility] to        [facility] [phone]* (retail)       [address] [place]  [address]       Ph: [phone] or [phone]       Fax: [phone]   RxID:   [id number] NEBULIZER/ADULT MASK  KIT (RESPIRATORY THERAPY SUPPLIES) use with albuterol and ipratropium q 6 hrs as needed  #1 x 3   Entered and Authorized by:   [name]   Signed by:   [name] on 09/27/2009   Method used:   [facility] to        [facility] [phone]* (retail)       [address] [place]  [address]       Ph: [phone] or [phone]       Fax: [phone]   RxID:   [id number]   Prevention & Chronic Care Immunizations   Influenza vaccine: Not documented   Influenza vaccine deferral: Refused  (11/24/2008)    Tetanus booster: Not documented   Td booster deferral: Not indicated  (02/02/2009)   Tetanus booster due: 02/03/2019    Pneumococcal vaccine: Not documented  Colorectal Screening   Hemoccult: Not documented    Colonoscopy: Not documented   Colonoscopy action/deferral: Deferred  (11/24/2008)  Other Screening   Pap smear: NEGATIVE FOR INTRAEPITHELIAL LESIONS OR MALIGNANCY.  (11/24/2008)   Pap smear action/deferral: Ordered  (11/24/2008)    Mammogram: ASSESSMENT: Negative - BI-RADS 1^MM DIGITAL SCREENING  (11/17/2008)   Mammogram action/deferral: Ordered  (11/11/2008)   Smoking status: never  (09/27/2009)  Lipids   Total Cholesterol: 136  (07/29/2009)   Lipid panel action/deferral: Lipid Panel ordered   LDL: 87  (07/29/2009)   LDL Direct: Not documented   HDL: 32  (07/29/2009)   Triglycerides: 85  (07/29/2009)    SGOT (AST): 15  (07/29/2009)   BMP action:  Ordered   SGPT (ALT): 9  (07/29/2009)   Alkaline phosphatase: 137  (07/29/2009)   Total bilirubin: 0.5  (07/29/2009)  Hypertension   Last Blood Pressure: 151 / 99  (09/27/2009)   Serum creatinine: 1.29  (07/29/2009)   BMP action: Ordered   Serum potassium 4.3  (07/29/2009)  Self-Management Support :   Personal Goals (by the next clinic visit) :      Personal blood pressure goal: 140/90  (05/31/2009)     Personal LDL goal: 100  (05/31/2009)    Patient will work on the following items until the next clinic visit to reach self-care goals:     Medications and monitoring: check my blood pressure, bring all of my medications to every visit  (09/27/2009)     Eating: eat more vegetables, use fresh or frozen vegetables, eat foods that are low in salt, eat baked foods instead of fried foods, eat fruit for snacks and desserts  (09/27/2009)     Activity: take a 30 minute walk every day  (09/27/2009)     Other: walked with grand daughter but started wheezing but had to stop  (12/21/2008)    Hypertension self-management support: Written self-care plan  (09/27/2009)   Hypertension self-care plan printed.    Lipid self-management support: Written self-care plan  (09/27/2009)   Lipid self-care plan printed.  Process Orders Check Orders Results:  [facility]: ABN not required for this insurance Tests Sent for requisitioning (September 28, 2009 3:29 PM):     09/27/2009: [facility] -- T-Urinalysis [[id number]] (signed)     09/27/2009: [facility] -- T-Urine Microalbumin w/creat. ratio [[id number]] (signed)      Medication Administration  Medication # 1:    Medication: Albuterol Sulfate Sol 1mg  unit dose    Diagnosis: WHEEZING (ICD-786.07)    Dose: 2.5mg /34ml    Route: inhaled    Exp Date: 12/2010    Lot #: [id number]    Mfr: NEPHRON    Patient tolerated medication without complications    Given by: Chinita Pester RN (September 27, 2009  9:21 AM)  Orders Added: 1)  Nebulizer Tx [14782] 2)  Albuterol Sulfate Sol 1mg  unit dose [N5621] 3)  Est. Patient Level IV [30865] 4)  12 Lead EKG [12 Lead EKG] 5)  Est. Patient Level IV [78469] 6)  CXR- 2view [CXR] 7)  PFT Baseline-Pre/Post Bronchodiolator [PFT Baseline-Pre/Pos] 8)  2 D Echo [2 D Echo] 9)  CPAP/BIPAP [CPAP/BIPAP] 10)  T-Urinalysis [81003-65000] 11)  T-Urine Microalbumin w/creat. ratio [[id number]]  Process Orders Check Orders Results:     [facility]: ABN not required for this insurance Tests Sent for requisitioning (September 28, 2009 3:29 PM):     09/27/2009: [facility] -- T-Urinalysis [[id number]] (signed)     09/27/2009: [facility] -- T-Urine Microalbumin w/creat. ratio [[id number]] (signed)   Nebulizer machine Rx and pt.'s info. faxed to [facility].    Chinita Pester RN  September 28, 2009 11:34 AM

## 2010-03-28 NOTE — Assessment & Plan Note (Signed)
Summary: HFU- PER DR MILLS/[facility]   Vital Signs:  Patient profile:   60 year old female Height:      62 inches Weight:      293.5 pounds BMI:     53.88 Temp:     97.4 degrees F oral Pulse rate:   66 / minute BP sitting:   174 / 95  (right arm)  Vitals Entered By: Filomena Jungling NT II (April 12, 2009 3:48 PM) CC: HFU, Depression Nutritional Status BMI of > 30 = obese  Have you ever been in a relationship where you felt threatened, hurt or afraid?No   Does patient need assistance? Functional Status Self care Ambulation Normal   Primary Care Provider:  [name]  CC:  HFU and Depression.  History of Present Illness: 1. Hospital follow up for a Strep pharyngitis.Completed full course of antibiotics as Rxed. Denies any soreness, dysphagia, odynophagia, fever, chills, SOB, hematuria, LBP or leg swelling. 2. HTN. Patient reports being out of all her medications for 1 week. Patient did not have her [place] "orange card." Reports a bitempolar dull, achy pain 4/10 scale without any visual or speech deficits or weakness.  Depression History:      The patient is having a depressed mood most of the day but denies diminished interest in her usual daily activities.  Positive alarm features for depression include significant weight gain, insomnia, fatigue (loss of energy), and feelings of worthlessness (guilt).         Preventive Screening-Counseling & Management  Alcohol-Tobacco     Alcohol drinks/day: 0     Alcohol type: beer,liquor     Smoking Status: never  Caffeine-Diet-Exercise     Does Patient Exercise: no  Current Problems (verified): 1)  Sinusitis, Acute  (ICD-461.9) 2)  Dizziness  (ICD-780.4) 3)  Wheezing  (ICD-786.07) 4)  Pain in Joint, Multiple Sites  (ICD-719.49) 5)  Fall, Hx of  (ICD-V15.88) 6)  Osteoarthritis, Sacroiliac Joint  (ICD-715.98) 7)  Tinea Pedis  (ICD-110.4) 8)  Back Pain  (ICD-724.5) 9)  Back Pain  (ICD-724.5) 10)  Hemoccult Positive  Stool  (ICD-578.1) 11)  Major Depressive Disorder Single Episode Mild  (ICD-296.21) 12)  Cellulitis, Foot, Right  (ICD-682.7) 13)  Foot Pain, Right  (ICD-729.5) 14)  Gout  (ICD-274.9) 15)  Bronchopneumonia Organism Unspecified  (ICD-485) 16)  Preventive Health Care  (ICD-V70.0) 17)  Knee Pain  (ICD-719.46) 18)  Renal Insufficiency, Acute  (ICD-585.9) 19)  Low Back Pain, Acute  (ICD-724.2) 20)  Adverse Side Effect, Diuretic  (ICD-E944.4) 21)  Metabolic Acidosis  (ICD-276.2) 22)  Headache  (ICD-784.0) 23)  Muscle Cramps  (ICD-729.82) 24)  Health Screening  (ICD-V70.0) 25)  Aftercare, Long-term Use, Medications Nec  (ICD-V58.69) 26)  Hyperlipidemia  (ICD-272.4) 27)  Chest Pain  (ICD-786.50) 28)  Sleep Apnea, Obstructive  (ICD-327.23) 29)  Coronary Artery Disease  (ICD-414.00) 30)  Hyperglycemia  (ICD-790.6) 31)  Alcohol Abuse  (ICD-305.00) 32)  Morbid Obesity  (ICD-278.01) 33)  Hypertension  (ICD-401.9) 34)  Anxiety  (ICD-300.00)  Medications Prior to Update: 1)  Furosemide 40 Mg  Tabs (Furosemide) .... Take 2 Tablets By Mouth in The Morning and One Tablet in The Afternoon 2)  Tramadol Hcl 50 Mg Tabs (Tramadol Hcl) .... Take 1 Tablet By Mouth Four Times A Day 3)  Carvedilol 25 Mg Tabs (Carvedilol) .... Take 1 Tablet Twice A Day. 4)  Bayer Aspirin 325 Mg Tabs (Aspirin) .... Take 1 Tablet By Mouth Once A Day 5)  Ibuprofen 200 Mg Tabs (Ibuprofen) .... Take 3 Tablets By Mouth Three Times A Day With Meals As Needed 6)  Ativan 1 Mg Tabs (Lorazepam) .... Take 1 Tablet 1/2 Hour Before Your Mri.  You Will Need Someone To Drive You To and From The Test.   May Take Another Tablet Within The Hour If Not Enough. 7)  Gabapentin 300 Mg Caps (Gabapentin) .... Taper Up To Three Tabs Three Times A Day 8)  Allopurinol 300 Mg Tabs (Allopurinol) .Marland Kitchen.. 1 Tab Daily 9)  Ventolin Hfa 108 (90 Base) Mcg/act Aers (Albuterol Sulfate) 10)  Zantac 150 Mg Tabs (Ranitidine Hcl) .... Take 1 Tablet By Mouth Two Times A  Day 11)  Zocor 40 Mg Tabs (Simvastatin) .... Take 1 Tab By Mouth At Bedtime 12)  Doxazosin Mesylate 1 Mg Tabs (Doxazosin Mesylate) .... Take 1 Tab By Mouth At Bedtime  Current Medications (verified): 1)  Furosemide 40 Mg  Tabs (Furosemide) .... Take 2 Tablets By Mouth in The Morning and One Tablet in The Afternoon 2)  Tramadol Hcl 50 Mg Tabs (Tramadol Hcl) .... Take 1 Tablet By Mouth Four Times A Day 3)  Carvedilol 25 Mg Tabs (Carvedilol) .... Take 1 Tablet Twice A Day. 4)  Bayer Aspirin 325 Mg Tabs (Aspirin) .... Take 1 Tablet By Mouth Once A Day 5)  Ibuprofen 200 Mg Tabs (Ibuprofen) .... Take 3 Tablets By Mouth Three Times A Day With Meals As Needed 6)  Ativan 1 Mg Tabs (Lorazepam) .... Take 1 Tablet 1/2 Hour Before Your Mri.  You Will Need Someone To Drive You To and From The Test.   May Take Another Tablet Within The Hour If Not Enough. 7)  Gabapentin 300 Mg Caps (Gabapentin) .... Taper Up To Three Tabs Three Times A Day 8)  Allopurinol 300 Mg Tabs (Allopurinol) .Marland Kitchen.. 1 Tab Daily 9)  Ventolin Hfa 108 (90 Base) Mcg/act Aers (Albuterol Sulfate) 10)  Zantac 150 Mg Tabs (Ranitidine Hcl) .... Take 1 Tablet By Mouth Two Times A Day 11)  Zocor 40 Mg Tabs (Simvastatin) .... Take 1 Tab By Mouth At Bedtime 12)  Doxazosin Mesylate 1 Mg Tabs (Doxazosin Mesylate) .... Take 1 Tab By Mouth At Bedtime  Allergies (verified): 1)  ! Ace Inhibitors 2)  ! Darvocet 3)  ! * Dilaudid  Past History:  Risk Factors: Alcohol Use: 0 (04/12/2009) Exercise: no (04/12/2009)  Past medical, surgical, family and social histories (including risk factors) reviewed for relevance to current acute and chronic problems.  Past Medical History: Reviewed history from 07/19/2008 and no changes required. Morbid obesity Hypertension Hyperlipidemia Coronary artery disease- non-Q wave MI's in '04, '05;     cath 9/06 with 10-15% left circumflex, diffuse OM and RCA disease.      Myoview in 05/08: Fixed defect at the apex and  inferior wall.  No stress inducedischemia. Sleep apnea- mild to moderate per sleep study 3/07 Anxiety Alcohol abuse- sober since 11/06?, Relapsed (EtOH 134 on 06/12/08) GERD Gout (clinical Dx. No arthrocentesis.) S/p hysterectomy. Chronic renal insufficiency (baseline Cr 1.2-1.6)  Past Surgical History: Reviewed history from 12/13/2005 and no changes required. Hysterectomy- secondary to endometriosis  Family History: Reviewed history from 09/13/2008 and no changes required. Father died of unknown cause  Mother died with alzheimer's Siblings with DM, no known CAD  Social History: Reviewed history from 07/19/2008 and no changes required. No smoking,  quit etoh this year, relapsed, reports sober 07/19/08 no illegal drugs.   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  Review of Systems       The patient complains of headaches.         Bilateral HA (temples) --throbbing; sensation as if "swimming," denies photophobia, visual or speech deficits; no generalized weakness.  Physical Exam  General:  alert and well-developed.   Head:  normocephalic and atraumatic.   Eyes:  vision grossly intact, pupils equal, pupils round, and pupils reactive to light.   Ears:  R ear normal and L ear normal.   Nose:  no external deformity, nasal dischargemucosal pallor, mucosal erythema, airflow obstruction, L frontal sinus tenderness, L maxillary sinus tenderness, R frontal sinus tenderness, and R maxillary sinus tenderness.   Mouth:  good dentition, no gingival abnormalities, no dental plaque, and pharynx pink and moist.   Neck:  supple, full ROM, and no masses.   Lungs:  scattered crackles heard greater over upper bases Heart:  normal rate, regular rhythm, no murmur, no gallop, no rub, and no JVD.   Abdomen:  soft, non-tender, normal bowel sounds, and no distention.   Msk:  no joint tenderness, no joint swelling, no joint warmth, and no redness over joints.   Pulses:  R and L  carotid,radial,femoral,dorsalis pedis and posterior tibial pulses are full and equal bilaterally Extremities:  No clubbing, cyanosis, edema, or deformity noted with normal full range of motion of all joints.   Neurologic:  alert & oriented X3, strength normal in all extremities, sensation intact to light touch, and gait normal.   Skin:  Plantar surface of mid- Right foot with excoriation and mild maceration approximately 0.5 cmin diameter. Psych:  Oriented X3, memory intact for recent and remote, normally interactive, good eye contact, not anxious appearing, and not depressed appearing.     Impression & Recommendations:  Problem # 1:  HEADACHE (ICD-784.0) Unclear etiology. Spoke with Dr. Phillips Odor. Unlikely related to uncontrolled HTN. Patient is instructed to go to Ed or call 911 if increase in HA frequency, severity, N/V, speech or visual deficits or weakness. Her updated medication list for this problem includes:    Tramadol Hcl 50 Mg Tabs (Tramadol hcl) .Marland Kitchen... Take 1 tablet by mouth four times a day    Carvedilol 25 Mg Tabs (Carvedilol) .Marland Kitchen... Take 1 tablet twice a day.    Bayer Aspirin 325 Mg Tabs (Aspirin) .Marland Kitchen... Take 1 tablet by mouth once a day    Ibuprofen 200 Mg Tabs (Ibuprofen) .Marland Kitchen... Take 3 tablets by mouth three times a day with meals as needed  Headache diary reviewed.  Problem # 2:  HEMOCCULT POSITIVE STOOL (ICD-578.1) Patient did not follow up with a colonoscopy referral "b/c cannot afford it." Will repeat FOBT x3 and continue to monitor her H:H; and for "red flags."  Problem # 3:  HYPERTENSION (ICD-401.9) Uncontrolled due to lack of adherance with a treatment plan,. Risks of HTN, including retinopathy, nephropathy, CAD, CVA and ultimate death discussed with the patient. She verbalised understanding. She is strongly advised to go ahead and fill up her prescriptions ASAP given received her "orange card" this afternoon. The following medications were removed from the medication  list:    Doxazosin Mesylate 1 Mg Tabs (Doxazosin mesylate) .Marland Kitchen... Take 1 tab by mouth at bedtime Her updated medication list for this problem includes:    Furosemide 40 Mg Tabs (Furosemide) .Marland Kitchen... Take 2 tablets by mouth in the morning and one tablet in the afternoon    Carvedilol 25 Mg Tabs (Carvedilol) .Marland Kitchen... Take 1 tablet twice a day.    Amlodipine Besylate  5 Mg Tabs (Amlodipine besylate) .Marland Kitchen... Take 1 tablet by mouth once a day  Orders: Ophthalmology Referral (Ophthalmology)  BP today: 174/95 Prior BP: 162/102 (03/14/2009)  Prior 10 Yr Risk Heart Disease: N/A (07/19/2008)  Labs Reviewed: K+: 4.3 (12/22/2008) Creat: : 1.30 (12/22/2008)   Chol: 111 (05/18/2008)   HDL: 31 (05/18/2008)   LDL: 63 (05/18/2008)   TG: 84 (05/18/2008)  Complete Medication List: 1)  Furosemide 40 Mg Tabs (Furosemide) .... Take 2 tablets by mouth in the morning and one tablet in the afternoon 2)  Tramadol Hcl 50 Mg Tabs (Tramadol hcl) .... Take 1 tablet by mouth four times a day 3)  Carvedilol 25 Mg Tabs (Carvedilol) .... Take 1 tablet twice a day. 4)  Bayer Aspirin 325 Mg Tabs (Aspirin) .... Take 1 tablet by mouth once a day 5)  Ibuprofen 200 Mg Tabs (Ibuprofen) .... Take 3 tablets by mouth three times a day with meals as needed 6)  Ativan 1 Mg Tabs (Lorazepam) .... Take 1 tablet 1/2 hour before your mri.  you will need someone to drive you to and from the test.   may take another tablet within the hour if not enough. 7)  Gabapentin 300 Mg Caps (Gabapentin) .... Taper up to three tabs three times a day 8)  Allopurinol 300 Mg Tabs (Allopurinol) .Marland Kitchen.. 1 tab daily 9)  Ventolin Hfa 108 (90 Base) Mcg/act Aers (Albuterol sulfate) 10)  Zantac 150 Mg Tabs (Ranitidine hcl) .... Take 1 tablet by mouth two times a day 11)  Zocor 40 Mg Tabs (Simvastatin) .... Take 1 tab by mouth at bedtime 12)  Amlodipine Besylate 5 Mg Tabs (Amlodipine besylate) .... Take 1 tablet by mouth once a day 13)  Citalopram Hydrobromide 40 Mg Tabs  (Citalopram hydrobromide) .... Take 1 tablet by mouth once a day  Other Orders: Mammogram (Screening) (Mammo)  Patient Instructions: 1)  Plese, take all your medications as prescribed. 2)  Please, go to ER if you experience worsening in headache, visual changes; speech deficit or weakness. 3)  Please, stop by our clinic for a blodd pressure check in 3 days. 4)  Our clinic will contact you with appointments for eye exam and mammogram. 5)  Follow up with Dr. Denton Meek in 4-8 weeks. Prescriptions: AMLODIPINE BESYLATE 5 MG TABS (AMLODIPINE BESYLATE) Take 1 tablet by mouth once a day  #30 x 3   Entered and Authorized by:   [name]   Signed by:   [name] on 04/12/2009   Method used:   Print then Give to Patient   RxID:   [id number] GABAPENTIN 300 MG CAPS (GABAPENTIN) taper up to three tabs three times a day  #270 x 3   Entered and Authorized by:   [name]   Signed by:   [name] on 04/12/2009   Method used:   Print then Give to Patient   RxID:   [id number] CITALOPRAM HYDROBROMIDE 40 MG TABS (CITALOPRAM HYDROBROMIDE) Take 1 tablet by mouth once a day  #30 x 3   Entered and Authorized by:   [name]   Signed by:   [name] on 04/12/2009   Method used:   Print then Give to Patient   RxID:   [id number] GABAPENTIN 300 MG CAPS (GABAPENTIN) taper up to three tabs three times a day  #270 x 3   Entered and Authorized by:   [name]   Signed by:   [name]  on 04/12/2009   Method used:   Print then Give to Patient   RxID:   [id number] AMLODIPINE BESYLATE 5 MG TABS (AMLODIPINE BESYLATE) Take 1 tablet by mouth once a day  #30 x 3   Entered and Authorized by:   [name]   Signed by:   [name] on 04/12/2009   Method used:   Print then Give to Patient   RxID:   [id number]    Prevention & Chronic Care Immunizations   Influenza vaccine: Not documented   Influenza  vaccine deferral: Refused  (11/24/2008)    Tetanus booster: Not documented   Td booster deferral: Not indicated  (02/02/2009)   Tetanus booster due: 02/03/2019    Pneumococcal vaccine: Not documented  Colorectal Screening   Hemoccult: Not documented    Colonoscopy: Not documented   Colonoscopy action/deferral: Deferred  (11/24/2008)  Other Screening   Pap smear: NEGATIVE FOR INTRAEPITHELIAL LESIONS OR MALIGNANCY.  (11/24/2008)   Pap smear action/deferral: Ordered  (11/24/2008)    Mammogram: ASSESSMENT: Negative - BI-RADS 1^MM DIGITAL SCREENING  (11/17/2008)   Mammogram action/deferral: Ordered  (11/11/2008)   Smoking status: never  (04/12/2009)  Lipids   Total Cholesterol: 111  (05/18/2008)   LDL: 63  (05/18/2008)   LDL Direct: Not documented   HDL: 31  (05/18/2008)   Triglycerides: 84  (05/18/2008)    SGOT (AST): 21  (07/19/2008)   SGPT (ALT): 22  (07/19/2008)   Alkaline phosphatase: 111  (07/19/2008)   Total bilirubin: 1.3  (07/19/2008)  Hypertension   Last Blood Pressure: 174 / 95  (04/12/2009)   Serum creatinine: 1.30  (12/22/2008)   Serum potassium 4.3  (12/22/2008)  Self-Management Support :    Patient will work on the following items until the next clinic visit to reach self-care goals:     Medications and monitoring: take my medicines every day, bring all of my medications to every visit  (04/12/2009)     Eating: drink diet soda or water instead of juice or soda, eat more vegetables, use fresh or frozen vegetables, eat foods that are low in salt, eat baked foods instead of fried foods, eat fruit for snacks and desserts  (04/12/2009)     Activity: take a 30 minute walk every day  (12/21/2008)     Other: walked with grand daughter but started wheezing but had to stop  (12/21/2008)    Hypertension self-management support: BP self-monitoring log, Written self-care plan  (03/14/2009)    Lipid self-management support: Pre-printed educational material, Written  self-care plan  (02/02/2009)

## 2010-03-28 NOTE — Progress Notes (Signed)
Summary: PAGE 2/ HLA  Phone Note Refill Request Message from:  Patient on September 05, 2009 9:31 AM  Refills Requested: Medication #1:  ZOCOR 40 MG TABS Take 1 tab by mouth at bedtime  Medication #2:  OMEPRAZOLE 40 MG CPDR Take 1 tablet by mouth two times a day  Medication #3:  AMLODIPINE BESYLATE 10 MG TABS Take 1 tablet by mouth once a day  Medication #4:  NITROSTAT 0.3 MG SUBL Take one tablet for chest pain as needed. May repeat tw more times 5 minutes apart. PAGE 2  Initial call taken by: Marin Roberts RN,  September 05, 2009 9:31 AM  Follow-up for Phone Call        Rx completed in Dr. Tiajuana Amass Follow-up by: [name],  September 05, 2009 5:06 PM    Prescriptions: SERTRALINE HCL 100 MG TABS (SERTRALINE HCL) Take 1 tablet by mouth once a day  #30 x 11   Entered and Authorized by:   [name]   Signed by:   [name] on 09/05/2009   Method used:   Electronically to        [facility] [phone]* (retail)       [address], [place]  [address]       Ph: [phone] or [phone]       Fax: [phone]   RxID:   [id number] NITROSTAT 0.3 MG SUBL (NITROGLYCERIN) Take one tablet for chest pain as needed. May repeat tw more times 5 minutes apart.  #30 x 11   Entered and Authorized by:   [name]   Signed by:   [name] on 09/05/2009   Method used:   [facility] to        [facility] [phone]* (retail)       [address] [place]  [address]       Ph: [phone] or [phone]       Fax: [phone]   RxID:   [id number] AMLODIPINE BESYLATE 10 MG TABS (AMLODIPINE BESYLATE) Take 1 tablet by mouth once a day  #30 x 11   Entered and Authorized by:   [name]   Signed by:   [name] on 09/05/2009   Method used:   [facility] to        [facility] [phone]* (retail)       [address] [place]  [address]       Ph: [phone] or [phone]       Fax:  [phone]   RxID:   [id number] OMEPRAZOLE 40 MG CPDR (OMEPRAZOLE) Take 1 tablet by mouth two times a day  #62 x 11   Entered and Authorized by:   [name]   Signed by:   [name] on 09/05/2009   Method used:   [facility] to        [facility] [phone]* (retail)       [address] [place]  [address]       Ph: [phone] or [phone]       Fax: [phone]   RxID:   [id number] ZOCOR 40 MG TABS (SIMVASTATIN) Take 1 tab by mouth at bedtime  #30 x 11   Entered and Authorized by:  [name]   Signed by:   [name] on 09/05/2009   Method used:   [facility] to        [facility]. [phone]* (retail)       [address]       [place], [place]  [address]       Ph: [phone] or [phone]       Fax: [phone]   RxID:   [id number] ALLOPURINOL 300 MG TABS (ALLOPURINOL) 1 tab daily  #30 x 11   Entered and Authorized by:   [name]   Signed by:   [name] on 09/05/2009   Method used:   [facility] to        [facility]. [phone]* (retail)       [address], [place]  [address]       Ph: [phone] or [phone]       Fax: [phone]   RxID:   [id number] BAYER ASPIRIN 325 MG TABS (ASPIRIN) Take 1 tablet by mouth once a day  #30 x 11   Entered and Authorized by:   [name]   Signed by:   [name] on 09/05/2009   Method used:   [facility] to        [facility] [phone]* (retail)       [address]       [place], [place]  [address]       Ph: [phone] or [phone]       Fax: [phone]   RxID:   [id number]

## 2010-03-28 NOTE — Medication Information (Signed)
Summary: [facility]   [facility]   Imported By: Margie Billet 01/16/2010 15:28:50  _____________________________________________________________________  External Attachment:    Type:   Image     Comment:   External Document

## 2010-03-28 NOTE — Consult Note (Signed)
Summary: SLEEP STUDY   SLEEP STUDY   Imported By: Louretta Parma 09/30/2009 12:11:15  _____________________________________________________________________  External Attachment:    Type:   Image     Comment:   External Document

## 2010-03-28 NOTE — Letter (Signed)
Summary: [facility]  [facility]   Imported By: Shon Hough 01/17/2010 14:41:29  _____________________________________________________________________  External Attachment:    Type:   Image     Comment:   External Document

## 2010-03-28 NOTE — Miscellaneous (Signed)
Summary: [facility]  [facility]   Imported By: Louretta Parma 10/06/2009 14:10:26  _____________________________________________________________________  External Attachment:    Type:   Image     Comment:   External Document

## 2010-03-28 NOTE — Progress Notes (Signed)
Summary: meds/ hla  Phone Note Call from Patient   Summary of Call: pt called saying she couldn't get her meds at pharm, [facility], pt informed, call ended Initial call taken by: Marin Roberts RN,  January 30, 2010 2:57 PM

## 2010-03-28 NOTE — Assessment & Plan Note (Signed)
INTERNAL MEDICINE TEACHING SERVICE ADMISSION HISTORY & PHYSICAL   Attending: Dr. Enedina Finner First contact: Dr. Arvilla Market [phone] Second contact: Dr. Polly Cobia [phone] [facility], after-hours: [phone], [phone])    PCP:Dr. Denton Meek   CC: Neck pain   HPI: 60 yo woman with PMH as outlined below presented to ED with about one week h/o sore throat. Her problems started with a flu like illness about one week ago. She had headache, productive cough and sore throat. Her cough was mucoid, and she had fever with chills. She was seen in the clinic 3 day ago for the same complaint. She was told it was likely viral and advised plenty of fluids and nyquil, which she did not take. Her problem got progressively worse and now she has severe pain in the neck and has some trouble with swallowing. Her breathing also sounds wet and rattly. Denies any sick contacts or recent travel. Denies NVD.   Allergies:   ! ACE INHIBITORS ! DARVOCET ! * DILAUDID   Past Medical History:  Morbid obesity Hypertension Hyperlipidemia Coronary artery disease- non-Q wave MI's in '04, '05;     cath 9/06 with 10-15% left circumflex, diffuse OM and RCA disease.      Myoview in 05/08: Fixed defect at the apex and inferior wall.  No stress inducedischemia. Sleep apnea- mild to moderate per sleep study 3/07 Anxiety Alcohol abuse- sober since 11/06?, Relapsed (EtOH 134 on 06/12/08) GERD Gout (clinical Dx. No arthrocentesis.) S/p hysterectomy. Chronic renal insufficiency (baseline Cr 1.2-1.6) Past Surgical History: Hysterectomy- secondary to endometriosis    Current Meds:  FUROSEMIDE 40 MG  TABS (FUROSEMIDE) Take 2 tablets by mouth in the morning and one tablet in the afternoon TRAMADOL HCL 50 MG TABS (TRAMADOL HCL) take 1 tablet by mouth four times a day CARVEDILOL 25 MG TABS (CARVEDILOL) take 1 tablet twice a day. BAYER ASPIRIN 325 MG TABS (ASPIRIN) Take 1 tablet by mouth once a day IBUPROFEN 200 MG TABS (IBUPROFEN) Take 3  tablets by mouth three times a day with meals as needed ATIVAN 1 MG TABS (LORAZEPAM) take 1 tablet 1/2 hour before your MRI.  You will need someone to drive you to and from the test.   May take another tablet within the hour if not enough. GABAPENTIN 300 MG CAPS (GABAPENTIN) taper up to three tabs three times a day ALLOPURINOL 300 MG TABS (ALLOPURINOL) 1 tab daily VENTOLIN HFA 108 (90 BASE) MCG/ACT AERS (ALBUTEROL SULFATE)  ZANTAC 150 MG TABS (RANITIDINE HCL) Take 1 tablet by mouth two times a day ZOCOR 40 MG TABS (SIMVASTATIN) Take 1 tab by mouth at bedtime DOXAZOSIN MESYLATE 1 MG TABS (DOXAZOSIN MESYLATE) Take 1 tab by mouth at bedtime    Social History:  No smoking,  quit etoh this year, relapsed, reports sober 07/19/08 no illegal drugs.   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   Family History:  Father died of unknown cause  Mother died with alzheimer's Siblings with DM, no known CAD   Review of System: Has some difficulty breathing, esp at night time. Has been using her inhalers more often lately. Rest per HPI. Has chronic low back pain secondary to DJD.   Vital Signs: BP-155/90 HR-73 RR-22 Temp-98.4 Sat-97%/ra  Physical Exam: GEN- Appears in discomfort due to neck pain.  HEENT- NCAT, PERRL, no icterus/pallor. Prominent tonsils L>R, + erythematous pharyngeal mucus membrane.  LUNGS- clear to auscultation.   CVS- RRR, no murmur ABD- soft, non-tender, normal bowel sounds.  EXT- no cyanosis, clubbing or edema.  NEURO- no focal deficit. PSY- appropriate.    LAB RESULTS   Sodium (NA)                              140               135-145          mEq/L  Potassium (K)                            4.3               3.5-5.1          mEq/L  Chloride                                 104               96-112           mEq/L  CO2                                      27                19-32            mEq/L  Glucose                                  122        h      70-99             mg/dL  BUN                                      20                6-23             mg/dL  Creatinine                               1.34       h      0.4-1.2          mg/dL  GFR, Est Non African American            41         l      >60              mL/min  GFR, Est African American                50         l      >60              mL/min  Calcium                                  8.5               8.4-10.5  mg/dL   WBC                                      13.1       h      4.0-10.5         K/uL  RBC                                      3.96              3.87-5.11        MIL/uL  Hemoglobin (HGB)                         11.8       l      12.0-15.0        g/dL  Hematocrit (HCT)                         35.9       l      36.0-46.0        %  MCV                                      90.5              78.0-100.0       fL  Platelet Count (PLT)                     221               150-400          K/uL  Neutrophils, %                           82         h      43-77            %  Neutrophils, Absolute                    10.7       h      1.7-7.7          K/uL   Color, Urine                             AMBER      a      YELLOW    BIOCHEMICALS MAY BE AFFECTED BY COLOR  Appearance                               CLEAR             CLEAR  Specific Gravity                         >1.046     h      1.005-1.030  pH  6.0               5.0-8.0  Urine Glucose                            NEGATIVE          NEG              mg/dL  Bilirubin                                SMALL      a      NEG  Ketones                                  NEGATIVE          NEG              mg/dL  Blood                                    NEGATIVE          NEG  Protein                                  30         a      NEG              mg/dL  Urobilinogen                             2.0        h      0.0-1.0          mg/dL  Nitrite                                  NEGATIVE          NEG   Leukocytes                               NEGATIVE          NEG   Squamous Epithelial / LPF                MANY       a      RARE  WBC / HPF                                0-2               <3               WBC/hpf  Bacteria / HPF                           FEW        a      RARE  Rapid strept: Positive.  Chest X-ray: Cardiomegaly w/o edema CT neck: Prominence of tissue in the posterior-superior nasal  of the pharynx and palatine tonsil region with narrowing of the airway.   Adenopathy.  These findings may be related to an infectious process   without well-defined drainable abscess.  Lymphoma cannot be   excluded and would need to be considered if the patient did not respond to treatment. Primary nasopharyngeal malignancy is a less likely consideration.  ASSESSMENT  This is a 60 yo AA woman admitted with sore throat due to neck space infection. There is some concern for airway compromise, but apart from noisy breathing, her sats are fine and she is hemodynamically stable. She needs IV abx and close monitoring. If concern for further airway compromise at any point will get ENT help.   PLAN - admit to step down - ivf, analgesics, antiemetics - blood cx, sputum cx - IV unasyn - heparin for prophylaxis - hold lasix, continue carvedilol, asa - continue allopurinol, gabapentin  ATTENDING PHYSICIAN: I performed and/or observed a history and physical examination of the patient.  I discussed the case with the residents as noted and reviewed the residents' notes.  I agree with the findings and plan--please refer to the attending physician note for more details.  Signature________________________________  Printed Name_____________________________

## 2010-03-28 NOTE — Progress Notes (Signed)
Summary: REFILL PAGE 1, CHANGING PHARMACIES/ [facility]  Phone Note Refill Request Message from:  Patient on September 05, 2009 9:29 AM  Refills Requested: Medication #1:  TRAMADOL HCL 50 MG TABS take 1 tablet by mouth four times a day  Medication #2:  CARVEDILOL 25 MG TABS take 1 tablet twice a day.  Medication #3:  GABAPENTIN 300 MG CAPS taper up to three tabs three times a day  Medication #4:  VENTOLIN HFA 108 (90 BASE) MCG/ACT AERS PT IS CHANGING PHARMACIES  Initial call taken by: Marin Roberts RN,  September 05, 2009 9:30 AM  Follow-up for Phone Call        Rx completed in Dr. Tiajuana Amass Follow-up by: [name],  September 05, 2009 5:01 PM    New/Updated Medications: VENTOLIN HFA 108 (90 BASE) MCG/ACT AERS (ALBUTEROL SULFATE) Take two puffs by mouth every 6 hours  as needed Prescriptions: VENTOLIN HFA 108 (90 BASE) MCG/ACT AERS (ALBUTEROL SULFATE) Take two puffs by mouth every 6 hours  as needed  #1 x 11   Entered and Authorized by:   [name]   Signed by:   [name] on 09/05/2009   Method used:   Electronically to        [facility] [phone]* (retail)       [address] [place]  [address]       Ph: [phone] or [phone]       Fax: [phone]   RxID:   [id number] GABAPENTIN 300 MG CAPS (GABAPENTIN) taper up to three tabs three times a day  #270 x 11   Entered and Authorized by:   [name]   Signed by:   [name] on 09/05/2009   Method used:   Electronically to        [facility]. [phone]* (retail)       [address]       [place], [place]  [address]       Ph: [phone] or [phone]       Fax: [phone]   RxID:   [id number] CARVEDILOL 25 MG TABS (CARVEDILOL) take 1 tablet twice a day.  #60 x 11   Entered and Authorized by:   [name]   Signed by:   [name] on 09/05/2009   Method used:   Electronically to        [facility] [phone]* (retail)       [address] [place]  [address]       Ph: [phone] or [phone]       Fax: [phone]   RxID:   [id number] TRAMADOL HCL 50 MG TABS (TRAMADOL HCL) take 1 tablet by mouth four times a day  #120 x 11   Entered and Authorized by:   [name]   Signed by:   [name] on 09/05/2009   Method used:   Electronically to        [facility]. [phone]* (retail)       [address]       [place], [place]  [address]       Ph: [phone] or [phone]       Fax: [phone]   RxID:   [id number]

## 2010-03-28 NOTE — Miscellaneous (Signed)
Summary: ALLSUP  ALLSUP   Imported By: Shon Hough 11/16/2009 12:00:03  _____________________________________________________________________  External Attachment:    Type:   Image     Comment:   External Document

## 2010-03-28 NOTE — Progress Notes (Signed)
Summary: Prior Authorization / Flovent/ Denied  Phone Note Outgoing Call   Call placed by: Angelina Ok RN,  September 28, 2009 3:53 PM Call placed to: Insurer Summary of Call: Call for Prior Authorization on Flovent.  Denied pt has not been on Qvar.  Pt will need to try Qvar or reasons should be given why pt should not be on Ovar. Angelina Ok RN  September 28, 2009 3:58 PM  Initial call taken by: Angelina Ok RN,  September 28, 2009 3:58 PM  Follow-up for Phone Call        Rx called in for QVar. Follow-up by: [name],  September 28, 2009 4:31 PM    New/Updated Medications: QVAR 40 MCG/ACT AERS (BECLOMETHASONE DIPROPIONATE) Two puffs two times a day as needed Prescriptions: QVAR 40 MCG/ACT AERS (BECLOMETHASONE DIPROPIONATE) Two puffs two times a day as needed  #1 x 11   Entered and Authorized by:   [name]   Signed by:   [name] on 09/28/2009   Method used:   Electronically to        [facility]. [phone]* (retail)       [address]       [place], [place]  [address]       Ph: [phone] or [phone]       Fax: [phone]   RxID:   [id number]

## 2010-03-28 NOTE — Assessment & Plan Note (Signed)
Summary: check up[mkj]   Vital Signs:  Patient profile:   60 year old female Height:      62 inches (157.48 cm) Weight:      285.0 pounds (129.55 kg) BMI:     52.32 Temp:     97.0 degrees F (36.11 degrees C) oral Pulse rate:   88 / minute BP sitting:   162 / 102  (right arm)  Vitals Entered By: Stanton Kidney Ditzler RN (March 14, 2009 1:34 PM) CC: Depression Is Patient Diabetic? No Pain Assessment Patient in pain? no      Nutritional Status BMI of > 30 = obese Nutritional Status Detail appetite down  Have you ever been in a relationship where you felt threatened, hurt or afraid?denies   Does patient need assistance? Functional Status Self care Ambulation Impaired:Risk for fall Comments Uses a walker. Ck-up and ck left foot. Head cold and ? bloody productive cough for past 2-3 days. Needs handicapped sticker.   Primary Care Provider:  [name]  CC:  Depression.  History of Present Illness: Vanessa Palmer is a 60 year old woman with past medical history as outlined in the EMR who presents for:  1.LBP --chronic, DJD per Xray taken in 09/2008.  Usually takes Tramadol, Gabapentin. Does not have any money to purchase her medications. C/o of difficulty walking due to pain, and would like a handi-cap sticker. Denies any radiculopathy. Patient never followed up with PT due to lack of insurance. Uses a walker to assist with walking for 2 months.    2.Flu like symptoms - Pt complains of headache, fronto-temporal region, cough, productive and blood tinged. She states these symptoms began 3-4 days ago. She reports nasal congestion, sneezing, body aches, and sore throat. She denies being around any sick contacts. Denies taking any OTC meds for relief. Pt denies fever. Reports using inhaler every night, difficulty breathing at night.   3.Gout f/u - Pt reports she went to the ED for left foot pain secondary to gout flare-up. She reports that it is still store, but swelling has improved.      Depression History:      The patient denies a depressed mood most of the day and a diminished interest in her usual daily activities.         Preventive Screening-Counseling & Management  Alcohol-Tobacco     Alcohol drinks/day: 0     Alcohol type: beer,liquor     Smoking Status: never  Caffeine-Diet-Exercise     Does Patient Exercise: no  Current Medications (verified): 1)  Furosemide 40 Mg  Tabs (Furosemide) .... Take 2 Tablets By Mouth in The Morning and One Tablet in The Afternoon 2)  Tramadol Hcl 50 Mg Tabs (Tramadol Hcl) .... Take 1 Tablet By Mouth Four Times A Day 3)  Carvedilol 25 Mg Tabs (Carvedilol) .... Take 1 Tablet Twice A Day. 4)  Bayer Aspirin 325 Mg Tabs (Aspirin) .... Take 1 Tablet By Mouth Once A Day 5)  Ibuprofen 200 Mg Tabs (Ibuprofen) .... Take 3 Tablets By Mouth Three Times A Day With Meals As Needed 6)  Fluoxetine Hcl 40 Mg Caps (Fluoxetine Hcl) .... Take 1 Tablet By Mouth Once A Day 7)  Ativan 1 Mg Tabs (Lorazepam) .... Take 1 Tablet 1/2 Hour Before Your Mri.  You Will Need Someone To Drive You To and From The Test.   May Take Another Tablet Within The Hour If Not Enough. 8)  Gabapentin 300 Mg Caps (Gabapentin) .... Taper Up To  Three Tabs Three Times A Day 9)  Allopurinol 300 Mg Tabs (Allopurinol) .Marland Kitchen.. 1 Tab Daily 10)  Ventolin Hfa 108 (90 Base) Mcg/act Aers (Albuterol Sulfate) 11)  Zantac 150 Mg Tabs (Ranitidine Hcl) .... Take 1 Tablet By Mouth Two Times A Day 12)  Zocor 40 Mg Tabs (Simvastatin) .... Take 1 Tab By Mouth At Bedtime  Allergies: 1)  ! Ace Inhibitors 2)  ! Darvocet 3)  ! * Dilaudid  Past History:  Past Medical History: Last updated: 07/19/2008 Morbid obesity Hypertension Hyperlipidemia Coronary artery disease- non-Q wave MI's in '04, '05;     cath 9/06 with 10-15% left circumflex, diffuse OM and RCA disease.      Myoview in 05/08: Fixed defect at the apex and inferior wall.  No stress inducedischemia. Sleep apnea- mild to moderate  per sleep study 3/07 Anxiety Alcohol abuse- sober since 11/06?, Relapsed (EtOH 134 on 06/12/08) GERD Gout (clinical Dx. No arthrocentesis.) S/p hysterectomy. Chronic renal insufficiency (baseline Cr 1.2-1.6)  Past Surgical History: Last updated: 12/13/2005 Hysterectomy- secondary to endometriosis  Family History: Last updated: 10-10-08 Father died of unknown cause  Mother died with alzheimer's Siblings with DM, no known CAD  Social History: Last updated: 07/19/2008 No smoking,  quit etoh this year, relapsed, reports sober 07/19/08 no illegal drugs.   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  Risk Factors: Alcohol Use: 0 (03/14/2009) Exercise: no (03/14/2009)  Risk Factors: Smoking Status: never (03/14/2009)  Review of Systems General:  Complains of chills and weakness; denies fever. Eyes:  Denies itching. ENT:  Complains of difficulty swallowing, earache, nasal congestion, postnasal drainage, sinus pressure, and sore throat. CV:  Denies chest pain or discomfort. Resp:  Complains of chest discomfort, cough, sputum productive, and wheezing. GI:  Complains of abdominal pain, indigestion, and nausea; denies change in bowel habits and vomiting. GU:  Denies dysuria. MS:  Complains of joint pain.  Physical Exam  General:  alert and well-developed.   Head:  normocephalic and atraumatic.   Eyes:  vision grossly intact, pupils equal, pupils round, and pupils reactive to light.   Ears:  R ear normal and L ear normal.   Nose:  no external deformity, nasal dischargemucosal pallor, mucosal erythema, airflow obstruction, L frontal sinus tenderness, L maxillary sinus tenderness, R frontal sinus tenderness, and R maxillary sinus tenderness.   Mouth:  good dentition, no gingival abnormalities, no dental plaque, and pharynx pink and moist.   Neck:  supple, full ROM, and no masses.   Lungs:  scattered crackles heard greater over upper bases Heart:  normal rate, regular rhythm, no  murmur, no gallop, no rub, and no JVD.   Abdomen:  soft, non-tender, normal bowel sounds, and no distention.   Msk:  decreased ROM.   Extremities:  bilateral pedal edema, nonpitting Neurologic:  alert & oriented X3, cranial nerves II-XII intact, and strength normal in all extremities.     Impression & Recommendations:  Problem # 1:  HYPERTENSION (ICD-401.9) Assessment Deteriorated BP remains elevated despite taking meds. However there was a period of time where she was unable to obtain meds due to financial difficulties.  Plan: Start doxazosin as it is afterload reducer and is on 4-dollar formulary. Will have patient follow up in 2 weeks to recheck blood pressure.   Her updated medication list for this problem includes:    Furosemide 40 Mg Tabs (Furosemide) .Marland Kitchen... Take 2 tablets by mouth in the morning and one tablet in the afternoon    Carvedilol 25  Mg Tabs (Carvedilol) .Marland Kitchen... Take 1 tablet twice a day.    Doxazosin Mesylate 1 Mg Tabs (Doxazosin mesylate) .Marland Kitchen... Take 1 tab by mouth at bedtime  BP today: 162/102 Prior BP: 191/99 (02/02/2009)  Prior 10 Yr Risk Heart Disease: N/A (07/19/2008)  Labs Reviewed: K+: 4.3 (12/22/2008) Creat: : 1.30 (12/22/2008)   Chol: 111 (05/18/2008)   HDL: 31 (05/18/2008)   LDL: 63 (05/18/2008)   TG: 84 (05/18/2008)  Problem # 2:  SINUSITIS, ACUTE (ICD-461.9) Assessment: New Likely secondary to viral etiology. Advised plenty of rest and will follow up in 2 weeks to re-evaluate. If patient is complaining of new onset fever or purulent mucus discharge, will consider bacterial etiology and start abx.   Problem # 3:  OSTEOARTHRITIS, SACROILIAC JOINT (ICD-715.98) Assessment: Comment Only Pt is walking with walker and would like a handi-cap sticker. Given severity of arthritis despite taking anti-inflammatory and other pain meds, will approve handi-cap sticker at this time. Pt is unable to afford PT/OT at this time.   Her updated medication list for this  problem includes:    Tramadol Hcl 50 Mg Tabs (Tramadol hcl) .Marland Kitchen... Take 1 tablet by mouth four times a day    Bayer Aspirin 325 Mg Tabs (Aspirin) .Marland Kitchen... Take 1 tablet by mouth once a day    Ibuprofen 200 Mg Tabs (Ibuprofen) .Marland Kitchen... Take 3 tablets by mouth three times a day with meals as needed  Problem # 4:  GOUT (ICD-274.9) Assessment: Comment Only Recent ED visit for increased left foot pain and swelling. Advised to continue with Allopurinol and ibuprofen. Reports foot is still sore, but has improved. Will continue to monitor. Xray from 03/03/2009 was negative and without acute osseous abnormalities.   Her updated medication list for this problem includes:    Allopurinol 300 Mg Tabs (Allopurinol) .Marland Kitchen... 1 tab daily  Complete Medication List: 1)  Furosemide 40 Mg Tabs (Furosemide) .... Take 2 tablets by mouth in the morning and one tablet in the afternoon 2)  Tramadol Hcl 50 Mg Tabs (Tramadol hcl) .... Take 1 tablet by mouth four times a day 3)  Carvedilol 25 Mg Tabs (Carvedilol) .... Take 1 tablet twice a day. 4)  Bayer Aspirin 325 Mg Tabs (Aspirin) .... Take 1 tablet by mouth once a day 5)  Ibuprofen 200 Mg Tabs (Ibuprofen) .... Take 3 tablets by mouth three times a day with meals as needed 6)  Ativan 1 Mg Tabs (Lorazepam) .... Take 1 tablet 1/2 hour before your mri.  you will need someone to drive you to and from the test.   may take another tablet within the hour if not enough. 7)  Gabapentin 300 Mg Caps (Gabapentin) .... Taper up to three tabs three times a day 8)  Allopurinol 300 Mg Tabs (Allopurinol) .Marland Kitchen.. 1 tab daily 9)  Ventolin Hfa 108 (90 Base) Mcg/act Aers (Albuterol sulfate) 10)  Zantac 150 Mg Tabs (Ranitidine hcl) .... Take 1 tablet by mouth two times a day 11)  Zocor 40 Mg Tabs (Simvastatin) .... Take 1 tab by mouth at bedtime 12)  Doxazosin Mesylate 1 Mg Tabs (Doxazosin mesylate) .... Take 1 tab by mouth at bedtime  Patient Instructions: 1)  Please schedule a follow-up  appointment in 2 weeks. 2)  Please take Doxazosin as prescribed. 3)  Please call clinic if symptoms worsen.  4)  Check your Blood Pressure regularly. If it is above: you should make an appointment. Prescriptions: DOXAZOSIN MESYLATE 1 MG TABS (DOXAZOSIN MESYLATE) Take 1 tab by  mouth at bedtime  #30 x 0   Entered and Authorized by:   Melida Quitter MD   Signed by:   Melida Quitter MD on 03/14/2009   Method used:   Print then Give to Patient   RxID:   [id number] DOXAZOSIN MESYLATE 1 MG TABS (DOXAZOSIN MESYLATE) Take 1 tab by mouth at bedtime  #30 x 0   Entered and Authorized by:   Melida Quitter MD   Signed by:   Melida Quitter MD on 03/14/2009   Method used:   Electronically to        [facility] (retail)       [address]       [place], [place]  [address]       Ph: [phone]       Fax: [phone]   RxID:   [id number]    Prevention & Chronic Care Immunizations   Influenza vaccine: Not documented   Influenza vaccine deferral: Refused  (11/24/2008)    Tetanus booster: Not documented   Td booster deferral: Not indicated  (02/02/2009)   Tetanus booster due: 02/03/2019    Pneumococcal vaccine: Not documented  Colorectal Screening   Hemoccult: Not documented    Colonoscopy: Not documented   Colonoscopy action/deferral: Deferred  (11/24/2008)  Other Screening   Pap smear: NEGATIVE FOR INTRAEPITHELIAL LESIONS OR MALIGNANCY.  (11/24/2008)   Pap smear action/deferral: Ordered  (11/24/2008)    Mammogram: ASSESSMENT: Negative - BI-RADS 1^MM DIGITAL SCREENING  (11/17/2008)   Mammogram action/deferral: Ordered  (11/11/2008)   Smoking status: never  (03/14/2009)  Lipids   Total Cholesterol: 111  (05/18/2008)   LDL: 63  (05/18/2008)   LDL Direct: Not documented   HDL: 31  (05/18/2008)   Triglycerides: 84  (05/18/2008)    SGOT (AST): 21  (07/19/2008)   SGPT (ALT): 22  (07/19/2008)   Alkaline phosphatase: 111  (07/19/2008)   Total  bilirubin: 1.3  (07/19/2008)    Lipid flowsheet reviewed?: Yes   Progress toward LDL goal: Improved  Hypertension   Last Blood Pressure: 162 / 102  (03/14/2009)   Serum creatinine: 1.30  (12/22/2008)   Serum potassium 4.3  (12/22/2008)    Hypertension flowsheet reviewed?: Yes   Progress toward BP goal: Deteriorated  Self-Management Support :    Patient will work on the following items until the next clinic visit to reach self-care goals:     Medications and monitoring: take my medicines every day, bring all of my medications to every visit  (03/14/2009)     Eating: eat more vegetables, use fresh or frozen vegetables, eat foods that are low in salt, eat baked foods instead of fried foods, eat fruit for snacks and desserts, limit or avoid alcohol  (03/14/2009)     Activity: take a 30 minute walk every day  (12/21/2008)     Other: walked with grand daughter but started wheezing but had to stop  (12/21/2008)    Hypertension self-management support: BP self-monitoring log, Written self-care plan  (03/14/2009)   Hypertension self-care plan printed.    Lipid self-management support: Pre-printed educational material, Written self-care plan  (02/02/2009)

## 2010-03-28 NOTE — Letter (Signed)
Summary: Reply to [employer]  [facility]  [address]   [place], [place] [address]   Phone: [phone]  Fax: [phone]    05/19/2009  TO: Rutherford Guys ATTN: [employer] Specialist  FAX [phone]  RE: request # [id number] For: Vanessa Palmer [address] [place], [place]  [address]  Dear [name], I received your request for [name]' medical records and they are attached. You also sent a blank Finctional Capacity Evaluation (FCE) form. I cannot fill this out as I did not do a complete FCE on this patient. I have attached both of my office notes from the two times I have seen her. I hope this is helpful to you            Sincerely,   Denny Levy MD

## 2010-03-28 NOTE — Assessment & Plan Note (Signed)
Summary: ACUTE-ER/FU WITH CHEST PAIN PER [facility]   Vital Signs:  Patient profile:   60 year old female Height:      62 inches (157.48 cm) Weight:      289.8 pounds (134.91 kg) BMI:     53.20 Temp:     96.9 degrees F (36.06 degrees C) Pulse rate:   69 / minute BP sitting:   158 / 105  (left arm) Cuff size:   large  Vitals Entered By: Theotis Barrio NT II (July 27, 2009 1:49 PM) CC: HAVING LEFT ARM , BILATERAL KNEE AND HEAD PAIN # 8  /   ER FOLLOW UP Is Patient Diabetic? No Pain Assessment Patient in pain? yes     Location: KNEES-HEAD Intensity:    8 Type: PINS-THUPING Nutritional Status BMI of > 30 = obese  Does patient need assistance? Functional Status Self care Ambulation Normal Comments WALKS WITH THE ASSIST OF A WALKER / PAIN IN HEAD- BILATERAL KNEE  /  ER FOLLOW UP APPT   Primary Care Provider:  [name]  CC:  HAVING LEFT ARM  and BILATERAL KNEE AND HEAD PAIN # 8  /   ER FOLLOW UP.  History of Present Illness: Vanessa Palmer is a 60 yo woman wtih PMH as outlined below.  She is here for ER f/u for chest pain.  She was evaluated in ED and case apparently discussed with Dr. Coralee Pesa and Dr. Sharyn Lull.  Decision made to discharge pt with f/u with Dr. Sharyn Lull for cardiolite.  However, she reports having followed up with him and that since she had a stress test 07/2008 and cath in past, there was no need to pursue another test.  She reports he did not think it was her heart.  Also recomended against diclofenac due to renal insufficiency.  She also complains of itching with some excoriations in bilateral upper extremities.  She denies any insect bites.    Depression History:      The patient denies a depressed mood most of the day and a diminished interest in her usual daily activities.         Preventive Screening-Counseling & Management  Alcohol-Tobacco     Alcohol drinks/day: 0     Alcohol type: beer,liquor     Smoking Status:  never  Caffeine-Diet-Exercise     Does Patient Exercise: no  Current Medications (verified): 1)  Tramadol Hcl 50 Mg Tabs (Tramadol Hcl) .... Take 1 Tablet By Mouth Four Times A Day 2)  Carvedilol 25 Mg Tabs (Carvedilol) .... Take 1 Tablet Twice A Day. 3)  Bayer Aspirin 325 Mg Tabs (Aspirin) .... Take 1 Tablet By Mouth Once A Day 4)  Gabapentin 300 Mg Caps (Gabapentin) .... Taper Up To Three Tabs Three Times A Day 5)  Allopurinol 300 Mg Tabs (Allopurinol) .Marland Kitchen.. 1 Tab Daily 6)  Ventolin Hfa 108 (90 Base) Mcg/act Aers (Albuterol Sulfate) 7)  Zocor 40 Mg Tabs (Simvastatin) .... Take 1 Tab By Mouth At Bedtime 8)  Omeprazole 40 Mg Cpdr (Omeprazole) .... Take 1 Tablet By Mouth Two Times A Day 9)  Amlodipine Besylate 5 Mg Tabs (Amlodipine Besylate) .... Take 1 Tablet By Mouth Two Times A Day 10)  Nitrostat 0.3 Mg Subl (Nitroglycerin) .... Take One Tablet For Chest Pain As Needed. May Repeat Tw More Times 5 Minutes Apart. 11)  Zoloft 50 Mg Tabs (Sertraline Hcl) .... Take 1/2 By Mouth Daily For 7 Days Then One Tablet Daily  Allergies (verified): 1)  !  Ace Inhibitors 2)  ! Darvocet 3)  ! * Dilaudid  Past History:  Past Medical History: Last updated: 07/19/2008 Morbid obesity Hypertension Hyperlipidemia Coronary artery disease- non-Q wave MI's in '04, '05;     cath 9/06 with 10-15% left circumflex, diffuse OM and RCA disease.      Myoview in 05/08: Fixed defect at the apex and inferior wall.  No stress inducedischemia. Sleep apnea- mild to moderate per sleep study 3/07 Anxiety Alcohol abuse- sober since 11/06?, Relapsed (EtOH 134 on 06/12/08) GERD Gout (clinical Dx. No arthrocentesis.) S/p hysterectomy. Chronic renal insufficiency (baseline Cr 1.2-1.6)  Past Surgical History: Last updated: 12/13/2005 Hysterectomy- secondary to endometriosis  Social History: Last updated: 07/19/2008 No smoking,  quit etoh this year, relapsed, reports sober 07/19/08 no illegal drugs.   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  Risk Factors: Smoking Status: never (07/27/2009)  Review of Systems      See HPI  Physical Exam  General:  alert, well-developed, well-hydrated, and overweight-appearing.   Eyes:  vision grossly intact, pupils equal, pupils round, and pupils reactive to light.   Lungs:  normal respiratory effort, no accessory muscle use, normal breath sounds, no crackles, and no wheezes.   Heart:  normal rate, regular rhythm, no gallop, and no rub.  grade 1/6 systolic murmur Abdomen:  normal bowel sounds.   Extremities:  +1 lower extremity edema bilaterally Neurologic:  alert & oriented X3.   Skin:  small papules on bilateral upper extremities with excoriations from scratching. Psych:  Oriented X3.     Impression & Recommendations:  Problem # 1:  HYPERTENSION (ICD-401.9) Only taking amlodipine daily, has not picked up new script from Dr Sharyn Lull continue regimen below.  Her updated medication list for this problem includes:    Carvedilol 25 Mg Tabs (Carvedilol) .Marland Kitchen... Take 1 tablet twice a day.    Amlodipine Besylate 5 Mg Tabs (Amlodipine besylate) .Marland Kitchen... Take 1 tablet by mouth two times a day  BP today: 158/105 Prior BP: 125/80 (05/31/2009)  Prior 10 Yr Risk Heart Disease: N/A (07/19/2008)  Labs Reviewed: K+: 4.1 (04/22/2009) Creat: : 1.35 (04/22/2009)   Chol: 111 (05/18/2008)   HDL: 31 (05/18/2008)   LDL: 63 (05/18/2008)   TG: 84 (05/18/2008)  Problem # 2:  RENAL INSUFFICIENCY, ACUTE (ICD-585.9) Will check BMP today to assess Cr.....1.55 in hospital  Labs Reviewed: BUN: 20 (04/22/2009)   Cr: 1.35 (04/22/2009)    Hgb: 11.6 (04/14/2009)   Hct: 34.6 (04/14/2009)   Ca++: 8.9 (04/22/2009)    TP: 8.4 (04/14/2009)   Alb: 3.2 (04/14/2009)  Problem # 3:  HYPERLIPIDEMIA (ICD-272.4)  LDL 105 in march during admission  Her updated medication list for this problem includes:    Zocor 40 Mg Tabs (Simvastatin) .Marland Kitchen... Take 1 tab by mouth at bedtime  Labs  Reviewed: SGOT: 17 (04/14/2009)   SGPT: 15 (04/14/2009)  Lipid Goals: Chol Goal: 200 (07/19/2008)   HDL Goal: 40 (07/19/2008)   LDL Goal: 100 (07/19/2008)   TG Goal: 150 (07/19/2008)  Prior 10 Yr Risk Heart Disease: N/A (07/19/2008)   HDL:31 (05/18/2008), 44 (10/13/2007)  LDL:63 (05/18/2008), 110 (66/44/0347)  Chol:111 (05/18/2008), 161 (10/13/2007)  Trig:84 (05/18/2008), 63 (10/13/2007)  Orders: T-Lipid Profile ([id number])  Problem # 4:  PAIN IN JOINT, MULTIPLE SITES (ICD-719.49) agree with stopping diclofenac due to renal insufficiency will continue with tramadol at renal dose  Problem # 5:  SLEEP APNEA, OBSTRUCTIVE (ICD-327.23) PCP will have to review old records and consider new sleep study as  pt clinically has OSA and does not have CPAP.  Complete Medication List: 1)  Tramadol Hcl 50 Mg Tabs (Tramadol hcl) .... Take 1 tablet by mouth four times a day 2)  Carvedilol 25 Mg Tabs (Carvedilol) .... Take 1 tablet twice a day. 3)  Bayer Aspirin 325 Mg Tabs (Aspirin) .... Take 1 tablet by mouth once a day 4)  Gabapentin 300 Mg Caps (Gabapentin) .... Taper up to three tabs three times a day 5)  Allopurinol 300 Mg Tabs (Allopurinol) .Marland Kitchen.. 1 tab daily 6)  Ventolin Hfa 108 (90 Base) Mcg/act Aers (Albuterol sulfate) 7)  Zocor 40 Mg Tabs (Simvastatin) .... Take 1 tab by mouth at bedtime 8)  Omeprazole 40 Mg Cpdr (Omeprazole) .... Take 1 tablet by mouth two times a day 9)  Amlodipine Besylate 5 Mg Tabs (Amlodipine besylate) .... Take 1 tablet by mouth two times a day 10)  Nitrostat 0.3 Mg Subl (Nitroglycerin) .... Take one tablet for chest pain as needed. may repeat tw more times 5 minutes apart. 11)  Zoloft 50 Mg Tabs (Sertraline hcl) .... Take 1/2 by mouth daily for 7 days then one tablet daily  Other Orders: T-Comprehensive Metabolic Panel ([id number])  Patient Instructions: 1)  Please schedule an appointment with your primary doctor in next available. 2)  Continue medications listed  below. 3)  Increase the amlodipine to 5mg  by mouth two times a day as prescribed by Dr. Sharyn Lull. 4)  Will check labs as discussed, need to come in on empty stomach. 5)  Stop the diclofenac because of your blood pressure and kidneys. 6)  May continue tramadol. 7)  if you have any problems, call clinic.  8)  May use benadryl for the itching Process Orders Check Orders Results:     [facility]: ABN not required for this insurance Tests Sent for requisitioning (July 27, 2009 2:25 PM):     07/27/2009: [facility] -- T-Lipid Profile [phone] (signed)     07/27/2009: [facility] -- T-Comprehensive Metabolic Panel [phone] (signed)    Prevention & Chronic Care Immunizations   Influenza vaccine: Not documented   Influenza vaccine deferral: Refused  (11/24/2008)    Tetanus booster: Not documented   Td booster deferral: Not indicated  (02/02/2009)   Tetanus booster due: 02/03/2019    Pneumococcal vaccine: Not documented  Colorectal Screening   Hemoccult: Not documented    Colonoscopy: Not documented   Colonoscopy action/deferral: Deferred  (11/24/2008)  Other Screening   Pap smear: NEGATIVE FOR INTRAEPITHELIAL LESIONS OR MALIGNANCY.  (11/24/2008)   Pap smear action/deferral: Ordered  (11/24/2008)    Mammogram: ASSESSMENT: Negative - BI-RADS 1^MM DIGITAL SCREENING  (11/17/2008)   Mammogram action/deferral: Ordered  (11/11/2008)   Smoking status: never  (07/27/2009)  Lipids   Total Cholesterol: 111  (05/18/2008)   Lipid panel action/deferral: Lipid Panel ordered   LDL: 63  (05/18/2008)   LDL Direct: Not documented   HDL: 31  (05/18/2008)   Triglycerides: 84  (05/18/2008)    SGOT (AST): 17  (04/14/2009)   BMP action: Ordered   SGPT (ALT): 15  (04/14/2009) CMP ordered    Alkaline phosphatase: 124  (04/14/2009)   Total bilirubin: 0.8  (04/14/2009)    Lipid flowsheet reviewed?: Yes   Progress toward LDL goal:  Deteriorated  Hypertension   Last Blood Pressure: 158 / 105  (07/27/2009)   Serum creatinine: 1.35  (04/22/2009)   BMP action: Ordered   Serum potassium 4.1  (04/22/2009) CMP ordered  Hypertension flowsheet reviewed?: Yes   Progress toward BP goal: Deteriorated  Self-Management Support :   Personal Goals (by the next clinic visit) :      Personal blood pressure goal: 140/90  (05/31/2009)     Personal LDL goal: 100  (05/31/2009)    Patient will work on the following items until the next clinic visit to reach self-care goals:     Medications and monitoring: take my medicines every day, bring all of my medications to every visit  (07/27/2009)     Eating: drink diet soda or water instead of juice or soda, eat more vegetables, use fresh or frozen vegetables, eat foods that are low in salt, eat baked foods instead of fried foods, eat fruit for snacks and desserts, limit or avoid alcohol  (07/27/2009)     Activity: take a 30 minute walk every day  (07/27/2009)     Other: walked with grand daughter but started wheezing but had to stop  (12/21/2008)    Hypertension self-management support: Written self-care plan  (07/27/2009)   Hypertension self-care plan printed.    Lipid self-management support: Written self-care plan  (07/27/2009)   Lipid self-care plan printed.

## 2010-03-28 NOTE — Assessment & Plan Note (Signed)
Summary: RECK BP/KARIMOVA/VS   Vital Signs:  Patient profile:   60 year old female Height:      62 inches (157.48 cm) Weight:      296.1 pounds (134.59 kg) BMI:     54.35 Temp:     96.8 degrees F (36 degrees C) oral Pulse rate:   66 / minute BP sitting:   142 / 91  (left arm) BP standing:   116 / 78  (left arm)  Vitals Entered By: Stanton Kidney Ditzler RN (April 14, 2009 9:30 AM) Is Patient Diabetic? No Pain Assessment Patient in pain? yes     Location: all over Intensity: 10 Type: weak Onset of pain  since 04/13/09 Nutritional Status BMI of 25 - 29 = overweight Nutritional Status Detail appetite good  Have you ever been in a relationship where you felt threatened, hurt or afraid?denies   Does patient need assistance? Functional Status Self care Ambulation Impaired:Risk for fall Comments Uses a walker. FU BP - chest pain and feeling weak since 04/13/09.   Primary Care Provider:  [name]   History of Present Illness: 60 y/o female with pmh as outlined in the EMR; who comes to the clinic complaining of chest pain, localized in her left side, sharp, constant and non radiated for the last 24-36 hours. Patient endorses weakness and feel she is going to pass out. Patient endorses Ha's.  She denies palpitation, fever, chills, abdominal pain, nausea, vomiting and blurred vision.  Patient has orthostatic changes with BP fluctuatuing from 140's sitting to 116 standing.  Patient went to the pharmacy yesterday and got all her medications including amlodipine 5mg  which was just added to her regimen for better BP control.  Depression History:      The patient denies a depressed mood most of the day and a diminished interest in her usual daily activities.        The patient denies that she feels like life is not worth living, denies that she wishes that she were dead, and denies that she has thought about ending her life.         Preventive Screening-Counseling &  Management  Alcohol-Tobacco     Alcohol drinks/day: 0     Alcohol type: beer,liquor     Smoking Status: never  Caffeine-Diet-Exercise     Does Patient Exercise: no  Problems Prior to Update: 1)  Sinusitis, Acute  (ICD-461.9) 2)  Dizziness  (ICD-780.4) 3)  Wheezing  (ICD-786.07) 4)  Pain in Joint, Multiple Sites  (ICD-719.49) 5)  Fall, Hx of  (ICD-V15.88) 6)  Osteoarthritis, Sacroiliac Joint  (ICD-715.98) 7)  Tinea Pedis  (ICD-110.4) 8)  Back Pain  (ICD-724.5) 9)  Back Pain  (ICD-724.5) 10)  Hemoccult Positive Stool  (ICD-578.1) 11)  Major Depressive Disorder Single Episode Mild  (ICD-296.21) 12)  Cellulitis, Foot, Right  (ICD-682.7) 13)  Foot Pain, Right  (ICD-729.5) 14)  Gout  (ICD-274.9) 15)  Bronchopneumonia Organism Unspecified  (ICD-485) 16)  Preventive Health Care  (ICD-V70.0) 17)  Knee Pain  (ICD-719.46) 18)  Renal Insufficiency, Acute  (ICD-585.9) 19)  Low Back Pain, Acute  (ICD-724.2) 20)  Adverse Side Effect, Diuretic  (ICD-E944.4) 21)  Metabolic Acidosis  (ICD-276.2) 22)  Headache  (ICD-784.0) 23)  Muscle Cramps  (ICD-729.82) 24)  Health Screening  (ICD-V70.0) 25)  Aftercare, Long-term Use, Medications Nec  (ICD-V58.69) 26)  Hyperlipidemia  (ICD-272.4) 27)  Chest Pain  (ICD-786.50) 28)  Sleep Apnea, Obstructive  (ICD-327.23) 29)  Coronary Artery Disease  (  ICD-414.00) 30)  Hyperglycemia  (ICD-790.6) 31)  Alcohol Abuse  (ICD-305.00) 32)  Morbid Obesity  (ICD-278.01) 33)  Hypertension  (ICD-401.9) 34)  Anxiety  (ICD-300.00)  Current Problems (verified): 1)  Sinusitis, Acute  (ICD-461.9) 2)  Dizziness  (ICD-780.4) 3)  Wheezing  (ICD-786.07) 4)  Pain in Joint, Multiple Sites  (ICD-719.49) 5)  Fall, Hx of  (ICD-V15.88) 6)  Osteoarthritis, Sacroiliac Joint  (ICD-715.98) 7)  Tinea Pedis  (ICD-110.4) 8)  Back Pain  (ICD-724.5) 9)  Back Pain  (ICD-724.5) 10)  Hemoccult Positive Stool  (ICD-578.1) 11)  Major Depressive Disorder Single Episode Mild   (ICD-296.21) 12)  Cellulitis, Foot, Right  (ICD-682.7) 13)  Foot Pain, Right  (ICD-729.5) 14)  Gout  (ICD-274.9) 15)  Bronchopneumonia Organism Unspecified  (ICD-485) 16)  Preventive Health Care  (ICD-V70.0) 17)  Knee Pain  (ICD-719.46) 18)  Renal Insufficiency, Acute  (ICD-585.9) 19)  Low Back Pain, Acute  (ICD-724.2) 20)  Adverse Side Effect, Diuretic  (ICD-E944.4) 21)  Metabolic Acidosis  (ICD-276.2) 22)  Headache  (ICD-784.0) 23)  Muscle Cramps  (ICD-729.82) 24)  Health Screening  (ICD-V70.0) 25)  Aftercare, Long-term Use, Medications Nec  (ICD-V58.69) 26)  Hyperlipidemia  (ICD-272.4) 27)  Chest Pain  (ICD-786.50) 28)  Sleep Apnea, Obstructive  (ICD-327.23) 29)  Coronary Artery Disease  (ICD-414.00) 30)  Hyperglycemia  (ICD-790.6) 31)  Alcohol Abuse  (ICD-305.00) 32)  Morbid Obesity  (ICD-278.01) 33)  Hypertension  (ICD-401.9) 34)  Anxiety  (ICD-300.00)  Medications Prior to Update: 1)  Furosemide 40 Mg  Tabs (Furosemide) .... Take 2 Tablets By Mouth in The Morning and One Tablet in The Afternoon 2)  Tramadol Hcl 50 Mg Tabs (Tramadol Hcl) .... Take 1 Tablet By Mouth Four Times A Day 3)  Carvedilol 25 Mg Tabs (Carvedilol) .... Take 1 Tablet Twice A Day. 4)  Bayer Aspirin 325 Mg Tabs (Aspirin) .... Take 1 Tablet By Mouth Once A Day 5)  Ibuprofen 200 Mg Tabs (Ibuprofen) .... Take 3 Tablets By Mouth Three Times A Day With Meals As Needed 6)  Ativan 1 Mg Tabs (Lorazepam) .... Take 1 Tablet 1/2 Hour Before Your Mri.  You Will Need Someone To Drive You To and From The Test.   May Take Another Tablet Within The Hour If Not Enough. 7)  Gabapentin 300 Mg Caps (Gabapentin) .... Taper Up To Three Tabs Three Times A Day 8)  Allopurinol 300 Mg Tabs (Allopurinol) .Marland Kitchen.. 1 Tab Daily 9)  Ventolin Hfa 108 (90 Base) Mcg/act Aers (Albuterol Sulfate) 10)  Zantac 150 Mg Tabs (Ranitidine Hcl) .... Take 1 Tablet By Mouth Two Times A Day 11)  Zocor 40 Mg Tabs (Simvastatin) .... Take 1 Tab By Mouth At  Bedtime 12)  Amlodipine Besylate 5 Mg Tabs (Amlodipine Besylate) .... Take 1 Tablet By Mouth Once A Day 13)  Citalopram Hydrobromide 40 Mg Tabs (Citalopram Hydrobromide) .... Take 1 Tablet By Mouth Once A Day  Current Medications (verified): 1)  Furosemide 40 Mg  Tabs (Furosemide) .... Take 2 Tablets By Mouth in The Morning and One Tablet in The Afternoon 2)  Tramadol Hcl 50 Mg Tabs (Tramadol Hcl) .... Take 1 Tablet By Mouth Four Times A Day 3)  Carvedilol 25 Mg Tabs (Carvedilol) .... Take 1 Tablet Twice A Day. 4)  Bayer Aspirin 325 Mg Tabs (Aspirin) .... Take 1 Tablet By Mouth Once A Day 5)  Ibuprofen 200 Mg Tabs (Ibuprofen) .... Take 3 Tablets By Mouth Three Times A Day With Meals As Needed  6)  Ativan 1 Mg Tabs (Lorazepam) .... Take 1 Tablet 1/2 Hour Before Your Mri.  You Will Need Someone To Drive You To and From The Test.   May Take Another Tablet Within The Hour If Not Enough. 7)  Gabapentin 300 Mg Caps (Gabapentin) .... Taper Up To Three Tabs Three Times A Day 8)  Allopurinol 300 Mg Tabs (Allopurinol) .Marland Kitchen.. 1 Tab Daily 9)  Ventolin Hfa 108 (90 Base) Mcg/act Aers (Albuterol Sulfate) 10)  Zantac 150 Mg Tabs (Ranitidine Hcl) .... Take 1 Tablet By Mouth Two Times A Day 11)  Zocor 40 Mg Tabs (Simvastatin) .... Take 1 Tab By Mouth At Bedtime 12)  Amlodipine Besylate 5 Mg Tabs (Amlodipine Besylate) .... Take 1 Tablet By Mouth Once A Day 13)  Citalopram Hydrobromide 40 Mg Tabs (Citalopram Hydrobromide) .... Take 1 Tablet By Mouth Once A Day  Allergies: 1)  ! Ace Inhibitors 2)  ! Darvocet 3)  ! * Dilaudid  Past History:  Past Medical History: Last updated: 07/19/2008 Morbid obesity Hypertension Hyperlipidemia Coronary artery disease- non-Q wave MI's in '04, '05;     cath 9/06 with 10-15% left circumflex, diffuse OM and RCA disease.      Myoview in 05/08: Fixed defect at the apex and inferior wall.  No stress inducedischemia. Sleep apnea- mild to moderate per sleep study  3/07 Anxiety Alcohol abuse- sober since 11/06?, Relapsed (EtOH 134 on 06/12/08) GERD Gout (clinical Dx. No arthrocentesis.) S/p hysterectomy. Chronic renal insufficiency (baseline Cr 1.2-1.6)  Past Surgical History: Last updated: 12/13/2005 Hysterectomy- secondary to endometriosis  Family History: Last updated: 2008-10-12 Father died of unknown cause  Mother died with alzheimer's Siblings with DM, no known CAD  Social History: Last updated: 07/19/2008 No smoking,  quit etoh this year, relapsed, reports sober 07/19/08 no illegal drugs.   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  Risk Factors: Alcohol Use: 0 (04/14/2009) Exercise: no (04/14/2009)  Risk Factors: Smoking Status: never (04/14/2009)  Review of Systems       As per HPI.  Physical Exam  General:  alert, well-developed, and overweight-appearing.   Lungs:  Mild wheezes, fair air movement, no crackles and noronchi. Heart:  normal rate, regular rhythm, no murmur, no gallop, no rub, and no JVD.   Abdomen:  soft, non-tender, normal bowel sounds, and no distention.   Msk:  no joint tenderness, no joint swelling, no joint warmth, and no redness over joints.   Neurologic:  alert & oriented X3, strength normal in all extremities, sensation intact to light touch, and gait normal.     Impression & Recommendations:  Problem # 1:  CHEST PAIN (ICD-786.50) Patient with atypical chest pain during this visit; due to CAD hx and risk factors; we checked cardiac enzymes and also EKG, both WNL. Patient was started on PPI for GERD and advised to follow lifestyle modification changes. Will continue risk factor modifications.  Orders: 12 Lead EKG (12 Lead EKG) T-Troponin I ([id number]) T-CK MB Fract ([id number]) T-Comprehensive Metabolic Panel ([id number]) T-CBC w/Diff ([id number])  Problem # 2:  HYPERLIPIDEMIA (ICD-272.4) Patient LDL goal 100; her LDL levels 63. Will continue zocor 40mg  daily and will advised patient to  follow a low fat diet.  Her updated medication list for this problem includes:    Zocor 40 Mg Tabs (Simvastatin) .Marland Kitchen... Take 1 tab by mouth at bedtime  Labs Reviewed: SGOT: 21 (07/19/2008)   SGPT: 22 (07/19/2008)  Lipid Goals: Chol Goal: 200 (07/19/2008)   HDL Goal: 40 (07/19/2008)  LDL Goal: 100 (07/19/2008)   TG Goal: 150 (07/19/2008)  Prior 10 Yr Risk Heart Disease: N/A (07/19/2008)   HDL:31 (05/18/2008), 44 (10/13/2007)  LDL:63 (05/18/2008), 110 (16/11/9602)  Chol:111 (05/18/2008), 161 (10/13/2007)  Trig:84 (05/18/2008), 63 (10/13/2007)  Problem # 3:  HYPERTENSION (ICD-401.9) Patient with weakness adn orthostatic changes after restarting her medications and start using amlodipine.There were no signs or symptoms of fluid overload. Will d/c amlodipine and will cut her lasix to 1 tablet by mouth two times a day. Will encourage patient to follow a low sodium diet. renal function was checked today and her Cr was mildly above baseline (most likely due to diuresis and low BP); electrolytes were normal.  The following medications were removed from the medication list:    Amlodipine Besylate 5 Mg Tabs (Amlodipine besylate) .Marland Kitchen... Take 1 tablet by mouth once a day Her updated medication list for this problem includes:    Furosemide 40 Mg Tabs (Furosemide) .Marland Kitchen... Take 1 tablet by mouth twice a day.    Carvedilol 25 Mg Tabs (Carvedilol) .Marland Kitchen... Take 1 tablet twice a day.  Problem # 4:  MAJOR DEPRESSIVE DISORDER SINGLE EPISODE MILD (ICD-296.21) Patient was recently started on celexa; to soon in order to make any changes in the dose or even see improvement of her symptoms. Will continue current regimen and will advised to follow with psychiatrist as instructed. Patient without suicidal ideation or hallucinations.  Complete Medication List: 1)  Furosemide 40 Mg Tabs (Furosemide) .... Take 1 tablet by mouth twice a day. 2)  Tramadol Hcl 50 Mg Tabs (Tramadol hcl) .... Take 1 tablet by mouth four times a  day 3)  Carvedilol 25 Mg Tabs (Carvedilol) .... Take 1 tablet twice a day. 4)  Bayer Aspirin 325 Mg Tabs (Aspirin) .... Take 1 tablet by mouth once a day 5)  Ibuprofen 200 Mg Tabs (Ibuprofen) .... Take 3 tablets by mouth three times a day with meals as needed 6)  Ativan 1 Mg Tabs (Lorazepam) .... Take 1 tablet 1/2 hour before your mri.  you will need someone to drive you to and from the test.   may take another tablet within the hour if not enough. 7)  Gabapentin 300 Mg Caps (Gabapentin) .... Taper up to three tabs three times a day 8)  Allopurinol 300 Mg Tabs (Allopurinol) .Marland Kitchen.. 1 tab daily 9)  Ventolin Hfa 108 (90 Base) Mcg/act Aers (Albuterol sulfate) 10)  Zocor 40 Mg Tabs (Simvastatin) .... Take 1 tab by mouth at bedtime 11)  Citalopram Hydrobromide 40 Mg Tabs (Citalopram hydrobromide) .... Take 1 tablet by mouth once a day 12)  Omeprazole 40 Mg Cpdr (Omeprazole) .... Take 1 tablet by mouth two times a day  Patient Instructions: 1)  Avoid foods high in acid (tomatoes, citrus juices, spicy foods). Avoid eating within two hours of lying down or before exercising. Do not over eat; try smaller more frequent meals. Elevate head of bed twelve inches when sleeping. 2)  Take your medications as prescribed. 3)  Keep yourself hydrated. 4)  Followup appointment in 2 weeks. 5)  Follow a low sodium diet (less than 2 grams daily). 6)  Cardiac enzymes and EKG were normal. 7)  Please stop using amlodipine and cut your lasix dose to just 40mg  daily until further instructions. Prescriptions: OMEPRAZOLE 40 MG CPDR (OMEPRAZOLE) Take 1 tablet by mouth two times a day  #62 x 3   Entered and Authorized by:   Vassie Loll MD   Signed by:  Vassie Loll MD on 04/14/2009   Method used:   Electronically to        [facility] (retail)       [address]       [place], [place]  [address]       Ph: [phone]       Fax: [phone]   RxID:   [id number]  Process  Orders Check Orders Results:     [facility]: ABN not required for this insurance Tests Sent for requisitioning (April 19, 2009 8:29 AM):     04/14/2009: [facility] -- T-Troponin I [phone] (signed)     04/14/2009: [facility] -- T-CK MB Fract [id number] (signed)     04/14/2009: [facility] -- T-Comprehensive Metabolic Panel [[id number]] (signed)     04/14/2009: [facility] -- T-CBC w/Diff [[id number]] (signed)    Prevention & Chronic Care Immunizations   Influenza vaccine: Not documented   Influenza vaccine deferral: Refused  (11/24/2008)    Tetanus booster: Not documented   Td booster deferral: Not indicated  (02/02/2009)   Tetanus booster due: 02/03/2019    Pneumococcal vaccine: Not documented  Colorectal Screening   Hemoccult: Not documented    Colonoscopy: Not documented   Colonoscopy action/deferral: Deferred  (11/24/2008)  Other Screening   Pap smear: NEGATIVE FOR INTRAEPITHELIAL LESIONS OR MALIGNANCY.  (11/24/2008)   Pap smear action/deferral: Ordered  (11/24/2008)    Mammogram: ASSESSMENT: Negative - BI-RADS 1^MM DIGITAL SCREENING  (11/17/2008)   Mammogram action/deferral: Ordered  (11/11/2008)   Smoking status: never  (04/14/2009)  Lipids   Total Cholesterol: 111  (05/18/2008)   LDL: 63  (05/18/2008)   LDL Direct: Not documented   HDL: 31  (05/18/2008)   Triglycerides: 84  (05/18/2008)    SGOT (AST): 21  (07/19/2008)   SGPT (ALT): 22  (07/19/2008) CMP ordered    Alkaline phosphatase: 111  (07/19/2008)   Total bilirubin: 1.3  (07/19/2008)  Hypertension   Last Blood Pressure: 142 / 91  (04/14/2009)   Serum creatinine: 1.30  (12/22/2008)   Serum potassium 4.3  (12/22/2008) CMP ordered   Self-Management Support :    Patient will work on the following items until the next clinic visit to reach self-care goals:     Medications and monitoring: take my medicines  every day, bring all of my medications to every visit  (04/14/2009)     Eating: eat more vegetables, use fresh or frozen vegetables, eat foods that are low in salt, eat fruit for snacks and desserts, limit or avoid alcohol  (04/14/2009)     Activity: take a 30 minute walk every day  (12/21/2008)     Other: walked with grand daughter but started wheezing but had to stop  (12/21/2008)    Hypertension self-management support: BP self-monitoring log, Written self-care plan  (03/14/2009)    Lipid self-management support: Pre-printed educational material, Written self-care plan  (02/02/2009)

## 2010-03-28 NOTE — Miscellaneous (Signed)
Summary: HFU WITH Kanika Bungert ON 05/31/2009  Hospital Discharge  Date of admission:05/03/2009  Date of discharge:05/04/2009  Brief reason for admission/active problems: 1. CP. CE neg x 3; EKG without  acute changes. Differential list includes Angina pectoris vs esophageal spasm vs MSK strain. Strated the patient on Amlodipine and NTG SL as needed in addition to Carvedilol. 2. HTN --stopped Lasix given no CHF. Started on amlodipine. 3. Depression --stable. Continue with Citalopram. 4. GERD --continue with Protonix. 5. Gout. clinically diagnosed without arthrothenthesis. Will check uric acid level to evlaute control with allopurinol. 6. Chronic renal insufficinecy -- Cr remained within her baseline range.  Followup needed: Check BP since stopped lasix and started amlodipine; evlaute for side-effects such as HA and pedal edema. Also, would need a follow up with a cardiologist Dr. Sharyn Lull  for a [facility] stress test to reassess for ischemia. (Was unable to schedule a f/u appointment --left a message with an answering service.)  The medication and problem lists have been updated.  Please see the dictated discharge summary for details.   Medications: Removed medication of FUROSEMIDE 40 MG  TABS (FUROSEMIDE) Take 1 tablet by mouth twice a day. - Signed Added new medication of AMLODIPINE BESYLATE 5 MG TABS (AMLODIPINE BESYLATE) Take 1 tablet by mouth once a day - Signed Added new medication of NITROSTAT 0.3 MG SUBL (NITROGLYCERIN) Take one tablet for chest pain as needed. May repeat tw more times 5 minutes apart. - Signed Added new medication of ASPIRIN 81 MG CHEW (ASPIRIN) Take 1 tablet by mouth once a day with meals - Signed Removed medication of ASPIRIN 81 MG CHEW (ASPIRIN) Take 1 tablet by mouth once a day with meals - Signed Removed medication of IBUPROFEN 200 MG TABS (IBUPROFEN) Take 3 tablets by mouth three times a day with meals as needed - Signed Rx of AMLODIPINE BESYLATE 5 MG TABS (AMLODIPINE  BESYLATE) Take 1 tablet by mouth once a day;  #30 x 3;  Signed;  Entered by: [name];  Authorized by: [name];  Method used: Electronically to [facility] [address] [place], [place], [place]  [address], Ph: [phone], Fax: [phone] Rx of NITROSTAT 0.3 MG SUBL (NITROGLYCERIN) Take one tablet for chest pain as needed. May repeat tw more times 5 minutes apart.;  #30 x 3;  Signed;  Entered by: [name];  Authorized by: [name];  Method used: Electronically to [facility] [address], [place], [place], [place]  [address], Ph: [phone], Fax: [phone] Rx of ASPIRIN 81 MG CHEW (ASPIRIN) Take 1 tablet by mouth once a day with meals;  #30 x 0;  Signed;  Entered by: [name];  Authorized by: [name];  Method used: Historical Rx of TRAMADOL HCL 50 MG TABS (TRAMADOL HCL) take 1 tablet by mouth four times a day;  #120 x 5;  Signed;  Entered by: Bethel Born MD;  Authorized by: [name];  Method used: Faxed to [facility], [address] [place], [place], [place]  [address], Ph: [phone], Fax: [phone] Rx of CARVEDILOL 25 MG TABS (CARVEDILOL) take 1 tablet twice a day.;  #60 x 3;  Signed;  Entered by: Bethel Born MD;  Authorized by: [name];  Method used: Faxed to [facility], [address] [place], [place], [place]  [address], Ph: [phone], Fax: [phone] Rx of BAYER ASPIRIN 325 MG TABS (ASPIRIN) Take 1 tablet by mouth once a  day;  #30 x 11;  Signed;  Entered by: Bethel Born MD;  Authorized by: [name];  Method used: Faxed to [facility], [address] [place], [place], [place]  [address], Ph: [phone], Fax: [phone] Rx of GABAPENTIN 300 MG CAPS (GABAPENTIN) taper up to three tabs three times a day;  #270 x 3;  Signed;  Entered by: Bethel Born MD;  Authorized by: [name];  Method used: Faxed to  [facility], [address] [place], [place], [place]  [address], Ph: [phone], Fax: [phone] Rx of ALLOPURINOL 300 MG TABS (ALLOPURINOL) 1 tab daily;  #30 x 3;  Signed;  Entered by: Bethel Born MD;  Authorized by: [name];  Method used: Faxed to [facility], [address] [place], [place], [place]  [address], Ph: [phone], Fax: [phone] Rx of VENTOLIN HFA 108 (90 BASE) MCG/ACT AERS (ALBUTEROL SULFATE) ;  #30 x 5;  Signed;  Entered by: Bethel Born MD;  Authorized by: [name];  Method used: Faxed to [facility], [address] [place], [place], [place]  [address], Ph: [phone], Fax: [phone] Rx of ZOCOR 40 MG TABS (SIMVASTATIN) Take 1 tab by mouth at bedtime;  #30 x 3;  Signed;  Entered by: Bethel Born MD;  Authorized by: [name];  Method used: Faxed to [facility], [address] [place], [place], [place]  [address], Ph: [phone], Fax: [phone] Rx of CITALOPRAM HYDROBROMIDE 40 MG TABS (CITALOPRAM HYDROBROMIDE) Take 1 tablet by mouth once a day;  #30 x 3;  Signed;  Entered by: Bethel Born MD;  Authorized by: [name];  Method used: Faxed to [facility], [address] [place], [place], [place]  [address], Ph: [phone], Fax: [phone] Rx of OMEPRAZOLE 40 MG CPDR (OMEPRAZOLE) Take 1 tablet by mouth two times a day;  #62 x 3;  Signed;  Entered by: Bethel Born MD;  Authorized by: [name];  Method used: Faxed to [facility], [address] [place], [place], [place]  [address], Ph: [phone], Fax: [phone] Rx of VOLTAREN 1 % GEL (DICLOFENAC SODIUM) Apply on your knees every 6 hours as needed for pain.;  #1 x 3;  Signed;  Entered by: Bethel Born MD;  Authorized by: [name];  Method used: Faxed to [facility], [address] [place], [place], [place]  [address], Ph: [phone], Fax: [phone] Rx of AMLODIPINE  BESYLATE 5 MG TABS (AMLODIPINE BESYLATE) Take 1 tablet by mouth once a day;  #30 x 3;  Signed;  Entered by: Bethel Born MD;  Authorized by: [name];  Method used: Faxed to [facility], [address] [place], [place], [place]  [address], Ph: [phone], Fax: [phone] Rx of NITROSTAT 0.3 MG SUBL (NITROGLYCERIN) Take one tablet for chest pain as needed. May repeat tw more times 5 minutes apart.;  #30 x 3;  Signed;  Entered by: Bethel Born MD;  Authorized by: [name];  Method used: Faxed to [facility], [address] [place], [place], [place]  [address], Ph: [phone], Fax: [phone] Observations: Added new observation of INSTRUCTIONS: Please come to an appointment at the outpatient clinic at [facility] for a followup visit. Ms. Lissa Hoard will contact you with your appointment date. Also, please, contact Dr. Sharyn Lull (Cardiologist) for a f/u on your chest pain if you do not hear from his office within 2 days. I spoke with Dr. Sharyn Lull and he  is aware of your hopsitalization. Stop taking  Lasix. New medication: amlodipine -- for your high blood pressure. Take one tablet daily. Call with any questions.   Please take your medication as prescribed below. (05/04/2009 11:30)    Prescriptions: NITROSTAT 0.3 MG SUBL (NITROGLYCERIN) Take one tablet for chest pain as needed. May repeat tw more times 5 minutes apart.  #30 x 3   Entered by:   Bethel Born MD   Authorized by:   [name]   Signed by:   Bethel Born MD on 05/05/2009   Method used:   Faxed to ...       [facility] Department (retail)       [address] [place], [place]  [address]       Ph: [phone]       Fax: [phone]   RxID:   [id number] AMLODIPINE BESYLATE 5 MG TABS (AMLODIPINE BESYLATE) Take 1 tablet by mouth once a day  #30 x 3   Entered by:   Bethel Born MD   Authorized by:   [name]   Signed by:   Bethel Born MD  on 05/05/2009   Method used:   Faxed to ...       [facility] Department (retail)       [address] [place], [place]  [address]       Ph: [phone]       Fax: [phone]   RxID:   [id number] VOLTAREN 1 % GEL (DICLOFENAC SODIUM) Apply on your knees every 6 hours as needed for pain.  #1 x 3   Entered by:   Bethel Born MD   Authorized by:   [name]   Signed by:   Bethel Born MD on 05/05/2009   Method used:   Faxed to ...       [facility] Department (retail)       [address] [place], [place]  [address]       Ph: [phone]       Fax: [phone]   RxID:   [id number] OMEPRAZOLE 40 MG CPDR (OMEPRAZOLE) Take 1 tablet by mouth two times a day  #62 x 3   Entered by:   Bethel Born MD   Authorized by:   [name]   Signed by:   Bethel Born MD on 05/05/2009   Method used:   Faxed to ...       [facility] Department (retail)       [address] [place], [place]  [address]       Ph: [phone]       Fax: [phone]   RxID:   [id number] CITALOPRAM HYDROBROMIDE 40 MG TABS (CITALOPRAM HYDROBROMIDE) Take 1 tablet by mouth once a day  #30 x 3   Entered by:   Bethel Born MD   Authorized by:   [name]   Signed by:   Bethel Born MD on 05/05/2009   Method used:   Faxed to ...       [facility] Department (retail)       [address] [place], [place]  [address]       Ph: [phone]       Fax: [phone]   RxID:   [id number] ZOCOR  40 MG TABS (SIMVASTATIN) Take 1 tab by mouth at bedtime  #30 x 3   Entered by:   Bethel Born MD   Authorized by:   [name]   Signed by:   Bethel Born MD on 05/05/2009   Method used:   Faxed to ...       [facility] Department (retail)       [address] [place], [place]  [address]       Ph: [phone]       Fax: [phone]   RxID:   [id number] VENTOLIN HFA 108 (90 BASE)  MCG/ACT AERS (ALBUTEROL SULFATE)   #30 x 5   Entered by:   Bethel Born MD   Authorized by:   [name]   Signed by:   Bethel Born MD on 05/05/2009   Method used:   Faxed to ...       [facility] Department (retail)       [address] [place], [place]  [address]       Ph: [phone]       Fax: [phone]   RxID:   [id number] ALLOPURINOL 300 MG TABS (ALLOPURINOL) 1 tab daily  #30 x 3   Entered by:   Bethel Born MD   Authorized by:   [name]   Signed by:   Bethel Born MD on 05/05/2009   Method used:   Faxed to ...       [facility] Department (retail)       [address] [place], [place]  [address]       Ph: [phone]       Fax: [phone]   RxID:   [id number] GABAPENTIN 300 MG CAPS (GABAPENTIN) taper up to three tabs three times a day  #270 x 3   Entered by:   Bethel Born MD   Authorized by:   [name]   Signed by:   Bethel Born MD on 05/05/2009   Method used:   Faxed to ...       [facility] Department (retail)       [address] [place], [place]  [address]       Ph: [phone]       Fax: [phone]   RxID:   [id number] BAYER ASPIRIN 325 MG TABS (ASPIRIN) Take 1 tablet by mouth once a day  #30 x 11   Entered by:   Bethel Born MD   Authorized by:   [name]   Signed by:   Bethel Born MD on 05/05/2009   Method used:   Faxed to ...       [facility] Department (retail)       [address] [place], [place]  [address]       Ph: [phone]       Fax: [phone]   RxID:   [id number] CARVEDILOL 25 MG TABS (CARVEDILOL) take 1 tablet twice a day.  #60 x 3   Entered by:   Bethel Born MD   Authorized by:   [name]   Signed by:   Bethel Born MD on 05/05/2009   Method used:   Faxed to ...       [facility] Department (retail)  [address] [place], [place]  [address]       Ph:  [phone]       Fax: [phone]   RxID:   [id number] TRAMADOL HCL 50 MG TABS (TRAMADOL HCL) take 1 tablet by mouth four times a day  #120 x 5   Entered by:   Bethel Born MD   Authorized by:   [name]   Signed by:   Bethel Born MD on 05/05/2009   Method used:   Faxed to ...       [facility] Department (retail)       [address] [place], [place]  [address]       Ph: [phone]       Fax: [phone]   RxID:   [id number] ASPIRIN 81 MG CHEW (ASPIRIN) Take 1 tablet by mouth once a day with meals  #30 x 0   Entered and Authorized by:   [name]   Signed by:   [name] on 05/04/2009   Method used:   Historical   RxID:   [id number] NITROSTAT 0.3 MG SUBL (NITROGLYCERIN) Take one tablet for chest pain as needed. May repeat tw more times 5 minutes apart.  #30 x 3   Entered and Authorized by:   [name]   Signed by:   [name] on 05/04/2009   Method used:   Electronically to        [facility] (retail)       [address]       [place], [place]  [address]       Ph: [phone]       Fax: [phone]   RxID:   [id number] AMLODIPINE BESYLATE 5 MG TABS (AMLODIPINE BESYLATE) Take 1 tablet by mouth once a day  #30 x 3   Entered and Authorized by:   [name]   Signed by:   [name] on 05/04/2009   Method used:   Electronically to        [facility] (retail)       [address]       [place], [place]  [address]       Ph: [phone]       Fax: [phone]   RxID:   [id number]    Patient Instructions: 1)  Please come to an appointment at the outpatient clinic at [facility] for a followup visit. Ms. Lissa Hoard will contact you with your appointment date. 2)  Also, please, contact Dr. Sharyn Lull (Cardiologist) for a f/u on your chest pain if you do not hear from his office within 2 days. I spoke with Dr.  Sharyn Lull and he is aware of your hopsitalization. 3)  Stop taking  Lasix. 4)  New medication: amlodipine -- for your high blood pressure. Take one tablet daily. 5)  Call with any questions. 6)  Please take your medication as prescribed below.

## 2010-03-28 NOTE — Letter (Signed)
Summary: ADVANCED CMN ORDER  ADVANCED CMN ORDER   Imported By: Shon Hough 10/27/2009 12:32:56  _____________________________________________________________________  External Attachment:    Type:   Image     Comment:   External Document

## 2010-03-28 NOTE — Assessment & Plan Note (Signed)
Summary: EST-3 MONTH F/U VISIT/CH   Vital Signs:  Patient profile:   60 year old female Height:      62 inches (157.48 cm) Weight:      287.9 pounds (130.86 kg) BMI:     52.85 Temp:     97.6 degrees F oral Pulse rate:   66 / minute BP sitting:   120 / 89  (left arm) Cuff size:   large  Vitals Entered By: Chinita Pester RN (January 10, 2010 1:52 PM) CC: 3 month f/u. Right arm pain. Request scooter. Wearin Depends now; c/o urinary frequency. Is Patient Diabetic? No Pain Assessment Patient in pain? yes     Location: right arm Intensity: 8 Type: aching Onset of pain  Intermittent Nutritional Status BMI of > 30 = obese  Have you ever been in a relationship where you felt threatened, hurt or afraid?No   Does patient need assistance? Functional Status Self care Ambulation Impaired:Risk for fall Comments rolling walker   Primary Care Provider:  [name]  CC:  3 month f/u. Right arm pain. Request scooter. Wearin Depends now; c/o urinary frequency..  History of Present Illness: Follow up appoointment: 1. from ED on 12/09/2009 for a left hip pain 2. Used son's glucometer -->BG of 160. Would like to "be tested for a diabetes." 3. Had a [facility] --no Cpap was Rx-ed. 4. Depression. Patient had an appointment with a psychiatrist this am --stable and doing better. No side-effects with her medication regimen.  Depression History:      The patient denies a depressed mood most of the day and a diminished interest in her usual daily activities.         Preventive Screening-Counseling & Management  Alcohol-Tobacco     Alcohol drinks/day: 0     Alcohol type: beer,liquor     Smoking Status: never  Caffeine-Diet-Exercise     Does Patient Exercise: no  Current Problems (verified): 1)  Hypertension  (ICD-401.9) 2)  Sleep Apnea, Obstructive  (ICD-327.23) 3)  Degenerative Joint Disease  (ICD-715.90) 4)  Hyperlipidemia  (ICD-272.4) 5)  Pedal Edema  (ICD-782.3) 6)  Intrinsic  Asthma, With Exacerbation  (ICD-493.12) 7)  Alkaline Phosphatase, Elevated  (ICD-790.5) 8)  Pain in Joint, Multiple Sites  (ICD-719.49) 9)  Fall, Hx of  (ICD-V15.88) 10)  Gout  (ICD-274.9) 11)  Major Depressive Disorder Single Episode Mild  (ICD-296.21) 12)  Headache  (ICD-784.0) 13)  Sleep Apnea, Obstructive  (ICD-327.23) 14)  Coronary Artery Disease  (ICD-414.00) 15)  Hyperglycemia  (ICD-790.6) 16)  Alcohol Abuse  (ICD-305.00) 17)  Morbid Obesity  (ICD-278.01) 18)  Preventive Health Care  (ICD-V70.0)  Allergies (verified): 1)  ! Ace Inhibitors 2)  ! Darvocet 3)  ! * Dilaudid  Past History:  Past medical, surgical, family and social histories (including risk factors) reviewed for relevance to current acute and chronic problems.  Past Medical History: Reviewed history from 07/19/2008 and no changes required. Morbid obesity Hypertension Hyperlipidemia Coronary artery disease- non-Q wave MI's in '04, '05;     cath 9/06 with 10-15% left circumflex, diffuse OM and RCA disease.      Myoview in 05/08: Fixed defect at the apex and inferior wall.  No stress inducedischemia. Sleep apnea- mild to moderate per sleep study 3/07 Anxiety Alcohol abuse- sober since 11/06?, Relapsed (EtOH 134 on 06/12/08) GERD Gout (clinical Dx. No arthrocentesis.) S/p hysterectomy. Chronic renal insufficiency (baseline Cr 1.2-1.6)  Past Surgical History: Reviewed history from 12/13/2005 and no changes required. Hysterectomy- secondary  to endometriosis  Family History: Reviewed history from 09/13/2008 and no changes required. Father died of unknown cause  Mother died with alzheimer's Siblings with DM, no known CAD  Social History: Reviewed history from 07/19/2008 and no changes required. No smoking,  quit etoh this year, relapsed, reports sober 07/19/08 no illegal drugs.   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  Physical Exam  General:  alert, well-developed, well-hydrated, and  overweight-appearing.   Eyes:  vision grossly intact, pupils equal, pupils round, and pupils reactive to light.   Nose:  no external deformity,  Mouth:  no gingival abnormalities, no dental plaque, and pharynx pink and moist.   Neck:  supple, full ROM, and no masses.   Lungs:  wheezing throughout bilaterallyno intercostal retractions, no accessory muscle use, no dullness, and no crackles.   Heart:  normal rate, regular rhythm, no murmur, no gallop, and no rub.   Abdomen:  normal bowel sounds.   Msk:  No deformity or scoliosis noted of thoracic or lumbar spine.   Walks with a difficuluty with a walker.decreased ROM and joint tenderness to both handsm hips,knees. Pulses:  R and L carotid,radial,femoral,dorsalis pedis and posterior tibial pulses are full and equal bilaterally Extremities:  2+ left pedal edema and 2+ right pedal edema.   Neurologic:  alert & oriented X3.   Skin:  color normal, no rashes, and no petechiae.   Psych:  Oriented X3, flat affect, and goodr eye contact.     Impression & Recommendations:  Problem # 1:  OSTEOARTHRITIS, SACROILIAC JOINT (ICD-715.98) Assessment Deteriorated Will increase Tramadol to 100 mg by mouth qid as needed and referr to ortho. Her updated medication list for this problem includes:    Tramadol Hcl 50 Mg Tabs (Tramadol hcl) .Marland Kitchen... Take 2 tablets by mouth four times a day    Bayer Aspirin 325 Mg Tabs (Aspirin) .Marland Kitchen... Take 1 tablet by mouth once a day  Discussed use of medications, application of heat or cold, and exercises.   Problem # 2:  DEPRESSION (ICD-311) Assessment: Improved Bing followed by a psychiatrist. Her updated medication list for this problem includes:    Sertraline Hcl 100 Mg Tabs (Sertraline hcl) .Marland Kitchen... Take 1 tablet by mouth once a day  Discussed treatment options, including trial of antidpressant medication. Will refer to behavioral health. Follow-up call in in 24-48 hours and recheck in 2 weeks, sooner as needed. Patient agrees  to call if any worsening of symptoms or thoughts of doing harm arise. Verified that the patient has no suicidal ideation at this time.   Problem # 3:  SLEEP APNEA, OBSTRUCTIVE (ICD-327.23) Assessment: New [place] Rx C-pap per reccomendations.  Problem # 4:  MORBID OBESITY (ICD-278.01) Assessment: Unchanged Discussed obesity and its affect on joints; diet discussed. Orders: T-Hgb A1C (in-house) ([id number])  Ht: 62 (01/10/2010)   Wt: 287.9 (01/10/2010)   BMI: 52.85 (01/10/2010)  Complete Medication List: 1)  Tramadol Hcl 50 Mg Tabs (Tramadol hcl) .... Take 2 tablets by mouth four times a day 2)  Carvedilol 25 Mg Tabs (Carvedilol) .... Take 1 tablet twice a day. 3)  Bayer Aspirin 325 Mg Tabs (Aspirin) .... Take 1 tablet by mouth once a day 4)  Gabapentin 300 Mg Caps (Gabapentin) .... Taper up to three tabs three times a day 5)  Allopurinol 300 Mg Tabs (Allopurinol) .Marland Kitchen.. 1 tab daily 6)  Ventolin Hfa 108 (90 Base) Mcg/act Aers (Albuterol sulfate) .... Take two puffs by mouth every 6 hours  as needed 7)  Zocor 40 Mg Tabs (Simvastatin) .... Take 1 tab by mouth at bedtime 8)  Omeprazole 40 Mg Cpdr (Omeprazole) .... Take 1 tablet by mouth two times a day 9)  Amlodipine Besylate 10 Mg Tabs (Amlodipine besylate) .... Take 1 tablet by mouth once a day 10)  Nitrostat 0.3 Mg Subl (Nitroglycerin) .... Take one tablet for chest pain as needed. may repeat tw more times 5 minutes apart. 11)  Sertraline Hcl 100 Mg Tabs (Sertraline hcl) .... Take 1 tablet by mouth once a day 12)  Lamotrigine 25 Mg Tabs (Lamotrigine) .... Tapper 13)  Nebulizer/adult Mask Kit (Respiratory therapy supplies) .... Use with albuterol and ipratropium q 6 hrs as needed 14)  Nebulizer Misc (Nebulizers) .... Use with albuterol and impratropium q 6 hours as needed 15)  Qvar 40 Mcg/act Aers (Beclomethasone dipropionate) .... Two puffs two times a day as needed 16)  Albuterol Sulfate (2.5 Mg/6ml) 0.083% Nebu (Albuterol sulfate) .... Use  one vial per nebulizer treatemnt q 6 hours as needed 17)  Ipratropium Bromide 0.02 % Soln (Ipratropium bromide) .... Use one vial with albuterol 6 hours as needed with nebulizer 18)  Furosemide 20 Mg Tabs (Furosemide) .... Take 1 tablet by mouth once a day in the morning 19)  Aerogear Action Asthma Kit Kit (Peak flow meter-inh assist dev) .... Use with all your inhalers as instructed 20)  Depend Overnight Briefs Large Misc (Incontinence supply disposable) .... Use as directed as needed 21)  Padsorber Bed Pan Liners Misc (Incontinence supply disposable) .... Use as needed 22)  Round Shower Stool Misc (Misc. devices) .... Use while taking showers to prevent falls 23)  E-z Lock Raised Toilet Seat Misc (Misc. devices) .... Please, use as directed  Other Orders: T-Basic Metabolic Panel [id number]) Orthopedic Referral (Ortho)  Patient Instructions: 1)  Please, follow up with a referral to see orthopedic specialist. 2)  Please, follow up with a mammogram and eye exams. 3)  Please, increase dose of Ultram to TWO tablet every 6 hours as needed for pain. 4)  The respiratory company will contact you for a CPAP mask for sleep apnea. 5)  Please, call with any questions. 6)  Follow up on as needed basis. Prescriptions: TRAMADOL HCL 50 MG TABS (TRAMADOL HCL) take 2 tablets by mouth four times a day  #240 x 11   Entered and Authorized by:   [name]   Signed by:   [name] on 01/10/2010   Method used:   [facility]ron[facility] to        [facility] [phone]* (retail)       [address], [place]  [address]       Ph: [phone] or [phone]       Fax: [phone]   RxID:   [id number]    Orders Added: 1)  Est. Patient Level III [70623] 2)  T-Hgb A1C (in-house) [76283TD] 3)  T-Basic Metabolic Panel [[id number]] 4)  Orthopedic Referral [Ortho]    Prevention & Chronic Care Immunizations   Influenza vaccine: Not documented   Influenza vaccine deferral:  Refused  (11/24/2008)    Tetanus booster: Not documented   Td booster deferral: Not indicated  (02/02/2009)   Tetanus booster due: 02/03/2019    Pneumococcal vaccine: Not documented  Colorectal Screening   Hemoccult: Not documented    Colonoscopy: Not documented   Colonoscopy action/deferral: Deferred  (11/24/2008)  Other Screening   Pap smear: NEGATIVE FOR INTRAEPITHELIAL LESIONS  OR MALIGNANCY.  (11/24/2008)   Pap smear action/deferral: Ordered  (11/24/2008)    Mammogram: ASSESSMENT: Negative - BI-RADS 1^MM DIGITAL SCREENING  (11/17/2008)   Mammogram action/deferral: Ordered  (11/11/2008)   Smoking status: never  (01/10/2010)  Lipids   Total Cholesterol: 136  (07/29/2009)   Lipid panel action/deferral: Lipid Panel ordered   LDL: 87  (07/29/2009)   LDL Direct: Not documented   HDL: 32  (07/29/2009)   Triglycerides: 85  (07/29/2009)    SGOT (AST): 15  (07/29/2009)   BMP action: Ordered   SGPT (ALT): 9  (07/29/2009)   Alkaline phosphatase: 137  (07/29/2009)   Total bilirubin: 0.5  (07/29/2009)  Hypertension   Last Blood Pressure: 120 / 89  (01/10/2010)   Serum creatinine: 1.29  (07/29/2009)   BMP action: Ordered   Serum potassium 4.3  (07/29/2009)  Self-Management Support :   Personal Goals (by the next clinic visit) :      Personal blood pressure goal: 140/90  (05/31/2009)     Personal LDL goal: 100  (05/31/2009)    Patient will work on the following items until the next clinic visit to reach self-care goals:     Medications and monitoring: take my medicines every day, bring all of my medications to every visit  (01/10/2010)     Eating: eat more vegetables, use fresh or frozen vegetables, eat foods that are low in salt, eat baked foods instead of fried foods  (01/10/2010)     Activity: take a 30 minute walk every day  (09/27/2009)     Other: walked with grand daughter but started wheezing but had to stop  (12/21/2008)    Hypertension self-management support:  Written self-care plan  (01/10/2010)   Hypertension self-care plan printed.    Lipid self-management support: Written self-care plan  (01/10/2010)   Lipid self-care plan printed.  Process Orders Check Orders Results:     [facility]: ABN not required for this insurance Tests Sent for requisitioning (January 10, 2010 4:39 PM):     01/10/2010: [facility] -- T-Basic Metabolic Panel [id number] (signed)     Laboratory Results   Blood Tests   Date/Time Received: January 10, 2010 3:09 PM   Date/Time Reported: Burke Keels  January 10, 2010 3:09 PM   HGBA1C: 5.7%   (Normal Range: Non-Diabetic - 3-6%   Control Diabetic - 6-8%)

## 2010-03-28 NOTE — Letter (Signed)
Summary: SLEEP MEDICINE  SLEEP MEDICINE   Imported By: Margie Billet 01/09/2010 15:40:28  _____________________________________________________________________  External Attachment:    Type:   Image     Comment:   External Document

## 2010-03-28 NOTE — Progress Notes (Signed)
Summary: phone/gg  Phone Note Call from Patient   Caller: Daughter Summary of Call: Call from pt's daughter,  asking for incontinent supplies for her mother.  Pt has medicaid. Pt does not have good bladder control, incontinent of urine. She wants blue pads for bed and diapers to wear at night. Also some pad to protect during the day.  They would like to use [place] home delivery. Pt # [id number] Initial call taken by: Merrie Roof RN,  October 05, 2009 3:46 PM  Follow-up for Phone Call        Provider Notified Follow-up by: Marin Roberts RN,  October 06, 2009 2:27 PM  Additional Follow-up for Phone Call Additional follow up Details #1::        Rx Called In Additional Follow-up by: [name],  October 06, 2009 2:42 PM    New/Updated Medications: DEPEND OVERNIGHT BRIEFS LARGE  MISC (INCONTINENCE SUPPLY DISPOSABLE) use as directed as needed PADSORBER BED PAN LINERS  MISC (INCONTINENCE SUPPLY DISPOSABLE) Use as needed Prescriptions: PADSORBER BED PAN LINERS  MISC (INCONTINENCE SUPPLY DISPOSABLE) Use as needed  #1 box x 11   Entered and Authorized by:   [name]   Signed by:   [name] on 10/06/2009   Method used:   [facility]        [facility] [phone]* (retail)       [address] [place]  [address]       Ph: [phone] or [phone]       Fax: [phone]   RxID:   [id number] DEPEND OVERNIGHT BRIEFS LARGE  MISC (INCONTINENCE SUPPLY DISPOSABLE) use as directed as needed  #1 box x 11   Entered and Authorized by:   [name]   Signed by:   [name] on 10/06/2009   Method used:   [facility] to        [facility] [phone]* (retail)       [address]       [place], [place]  [address]       Ph: [phone] or [phone]       Fax: [phone]   RxID:   [id number]

## 2010-03-28 NOTE — Progress Notes (Signed)
Summary: Soc. Work  Nurse, children's placed by: Soc. Work Summary of Call: Called [facility] who had referred Vanessa Palmer to [facility] at [phone] for full range of mental health services including community support, outpatient therapy, PSR and medication mgmt.   They have just started working with her.   She kept her appmt with the therapist on July 5th and has a med mgmt appmt on July 8th.

## 2010-03-28 NOTE — Progress Notes (Signed)
Summary: ER visit  Phone Note Call from Patient   Caller: Patient Call For: Vanessa Robinson MD Summary of Call: Call from pt wanted to let us know that she went to the ER for Gout.  Said that she was given a shot and to prescriptions for pain and will get them filled just wanted [name] to be aware. Angelina Ok RN  March 08, 2009 11:48 AM  Initial call taken by: Angelina Ok RN,  March 08, 2009 11:48 AM

## 2010-03-28 NOTE — Assessment & Plan Note (Signed)
Summary: F/U WITH LABS PER DR KARIMOVA/CFB   Vital Signs:  Patient profile:   60 year old female Height:      62 inches (157.48 cm) Weight:      290.0 pounds (131.82 kg) BMI:     53.23 Temp:     97.4 degrees F oral Pulse rate:   67 / minute BP sitting:   132 / 86  (left arm) Cuff size:   large  Vitals Entered By: Chinita Pester RN (February 03, 2010 3:39 PM) CC: F/u visit. Ed visit 01/28/10. Cold symptoms.  Is Patient Diabetic? No Pain Assessment Patient in pain? yes     Location: left groin Intensity: 10 Type: sharp Onset of pain  Chronic Nutritional Status BMI of > 30 = obese  Have you ever been in a relationship where you felt threatened, hurt or afraid?No   Does patient need assistance? Functional Status Self care Ambulation Impaired:Risk for fall Comments uses a rolling walker.   Primary Care Provider:  [name]  CC:  F/u visit. Ed visit 01/28/10. Cold symptoms. .  History of Present Illness: 1. Follow up on left flank/pelvic pain. patient had a CT of abdomen/pelvis in October which was negative. Patient was evaluated by a Sports Medicine specialist ->did not feel that her pain was of MSK origin. 2. Depression --doing better with Celexa, Trazadone and lamictal. Sees Psychiatrist at behavioral health on a regular basis. Denies SI/HI or mania.  Depression History:      The patient denies a depressed mood most of the day and a diminished interest in her usual daily activities.         Preventive Screening-Counseling & Management  Alcohol-Tobacco     Alcohol drinks/day: 0     Alcohol type: beer,liquor     Smoking Status: never  Caffeine-Diet-Exercise     Does Patient Exercise: no  Current Problems (verified): 1)  Pelvic Pain, Chronic  (ICD-789.09) 2)  Hypertension  (ICD-401.9) 3)  Sleep Apnea, Obstructive  (ICD-327.23) 4)  Degenerative Joint Disease  (ICD-715.90) 5)  Hyperlipidemia  (ICD-272.4) 6)  Pedal Edema  (ICD-782.3) 7)  Intrinsic Asthma,  With Exacerbation  (ICD-493.12) 8)  Alkaline Phosphatase, Elevated  (ICD-790.5) 9)  Pain in Joint, Multiple Sites  (ICD-719.49) 10)  Fall, Hx of  (ICD-V15.88) 11)  Gout  (ICD-274.9) 12)  Major Dprsv Disorder Recurrent Episode Moderate  (ICD-296.32) 13)  Headache  (ICD-784.0) 14)  Sleep Apnea, Obstructive  (ICD-327.23) 15)  Coronary Artery Disease  (ICD-414.00) 16)  Hyperglycemia  (ICD-790.6) 17)  Alcohol Abuse  (ICD-305.00) 18)  Morbid Obesity  (ICD-278.01) 19)  Preventive Health Care  (ICD-V70.0)  Current Medications (verified): 1)  Tramadol Hcl 50 Mg Tabs (Tramadol Hcl) .... Take 2 Tablets By Mouth Four Times A Day 2)  Carvedilol 25 Mg Tabs (Carvedilol) .... Take 1 Tablet Twice A Day. 3)  Bayer Aspirin 325 Mg Tabs (Aspirin) .... Take 1 Tablet By Mouth Once A Day 4)  Gabapentin 300 Mg Caps (Gabapentin) .... Taper Up To Three Tabs Three Times A Day 5)  Allopurinol 300 Mg Tabs (Allopurinol) .Marland Kitchen.. 1 Tab Daily 6)  Ventolin Hfa 108 (90 Base) Mcg/act Aers (Albuterol Sulfate) .... Take Two Puffs By Mouth Every 6 Hours  As Needed 7)  Zocor 40 Mg Tabs (Simvastatin) .... Take 1 Tab By Mouth At Bedtime 8)  Omeprazole 40 Mg Cpdr (Omeprazole) .... Take 1 Tablet By Mouth Two Times A Day 9)  Amlodipine Besylate 10 Mg Tabs (Amlodipine Besylate) .Marland KitchenMarland KitchenMarland Kitchen  Take 1 Tablet By Mouth Once A Day 10)  Nitrostat 0.3 Mg Subl (Nitroglycerin) .... Take One Tablet For Chest Pain As Needed. May Repeat Tw More Times 5 Minutes Apart. 11)  Sertraline Hcl 100 Mg Tabs (Sertraline Hcl) .... Take 1 Tablet By Mouth Once A Day 12)  Lamotrigine 25 Mg Tabs (Lamotrigine) .... Tapper 13)  Nebulizer/adult Mask  Kit (Respiratory Therapy Supplies) .... Use With Albuterol and Ipratropium Q 6 Hrs As Needed 14)  Nebulizer  Misc (Nebulizers) .... Use With Albuterol and Impratropium Q 6 Hours As Needed 15)  Qvar 40 Mcg/act Aers (Beclomethasone Dipropionate) .... Two Puffs Two Times A Day As Needed 16)  Albuterol Sulfate (2.5 Mg/36ml) 0.083%  Nebu (Albuterol Sulfate) .... Use One Vial Per Nebulizer Treatemnt Q 6 Hours As Needed 17)  Ipratropium Bromide 0.02 % Soln (Ipratropium Bromide) .... Use One Vial With Albuterol 6 Hours As Needed With Nebulizer 18)  Furosemide 20 Mg Tabs (Furosemide) .... Take 1 Tablet By Mouth Once A Day in The Morning 19)  Aerogear Action Asthma Kit  Kit (Peak Flow Meter-Inh Assist Dev) .... Use With All Your Inhalers As Instructed 20)  Depend Overnight Briefs Large  Misc (Incontinence Supply Disposable) .... Use As Directed As Needed 21)  Padsorber Bed Pan Liners  Misc (Incontinence Supply Disposable) .... Use As Needed 22)  Round Shower Stool  Misc (Misc. Devices) .... Use While Taking Showers To Prevent Falls 23)  E-Z Lock Raised Toilet Seat  Misc (Misc. Devices) .... Please, Use As Directed  Allergies (verified): 1)  ! Ace Inhibitors 2)  ! Darvocet 3)  ! * Dilaudid  Past History:  Past medical, surgical, family and social histories (including risk factors) reviewed for relevance to current acute and chronic problems.  Past Medical History: Reviewed history from 07/19/2008 and no changes required. Morbid obesity Hypertension Hyperlipidemia Coronary artery disease- non-Q wave MI's in '04, '05;     cath 9/06 with 10-15% left circumflex, diffuse OM and RCA disease.      Myoview in 05/08: Fixed defect at the apex and inferior wall.  No stress inducedischemia. Sleep apnea- mild to moderate per sleep study 3/07 Anxiety Alcohol abuse- sober since 11/06?, Relapsed (EtOH 134 on 06/12/08) GERD Gout (clinical Dx. No arthrocentesis.) S/p hysterectomy. Chronic renal insufficiency (baseline Cr 1.2-1.6)  Past Surgical History: Reviewed history from 12/13/2005 and no changes required. Hysterectomy- secondary to endometriosis  Family History: Reviewed history from 09/13/2008 and no changes required. Father died of unknown cause  Mother died with alzheimer's Siblings with DM, no known CAD  Social  History: Reviewed history from 07/19/2008 and no changes required. No smoking,  quit etoh this year, relapsed, reports sober 07/19/08 no illegal drugs.   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  Physical Exam  General:  alert, well-developed, well-hydrated, and overweight-appearing.   Head:  normocephalic and atraumatic.   Mouth:  no gingival abnormalities, no dental plaque, and pharynx pink and moist.   Neck:  supple, full ROM, and no masses.   Heart:  normal rate, regular rhythm, no murmur, no gallop, and no rub.   Abdomen:  normal bowel sounds.   Msk:  No deformity or scoliosis noted of thoracic or lumbar spine.   Walks with a difficuluty with a walker.decreased ROM and joint tenderness to both handsm hips,knees. Pulses:  R and L carotid,radial,femoral,dorsalis pedis and posterior tibial pulses are full and equal bilaterally Extremities:  2+ left pedal edema and 2+ right pedal edema.   Neurologic:  alert & oriented X3.   Skin:  color normal, no rashes, and no petechiae.   Psych:  Oriented X3, flat affect, and goodr eye contact.  not anxious appearing.     Impression & Recommendations:  Problem # 1:  PELVIC PAIN, CHRONIC (ICD-789.09) Assessment Unchanged  Likely of psychosomatic origin. Had a CT scan of pelvis and abdomen; PAP, consult at Sports medicine and PT. Patient is already under care of a psychiatrist. Will refer to GYN. Her updated medication list for this problem includes:    Tramadol Hcl 50 Mg Tabs (Tramadol hcl) .Marland Kitchen... Take 2 tablets by mouth four times a day    Bayer Aspirin 325 Mg Tabs (Aspirin) .Marland Kitchen... Take 1 tablet by mouth once a day  Orders: Gynecologic Referral (Gyn)  Discussed use of medications, application of heat or cold, and exercises.   Problem # 2:  HYPERTENSION (ICD-401.9) Assessment: Unchanged No change in regimen. Her updated medication list for this problem includes:    Carvedilol 25 Mg Tabs (Carvedilol) .Marland Kitchen... Take 1 tablet twice a day.     Amlodipine Besylate 10 Mg Tabs (Amlodipine besylate) .Marland Kitchen... Take 1 tablet by mouth once a day    Furosemide 20 Mg Tabs (Furosemide) .Marland Kitchen... Take 1 tablet by mouth once a day in the morning  Orders: T-Basic Metabolic Panel [phone])  BP today: 132/86 Prior BP: 120/89 (01/10/2010)  Prior 10 Yr Risk Heart Disease: N/A (07/19/2008)  Labs Reviewed: K+: 4.3 (01/10/2010) Creat: : 2.11 (01/10/2010)   Chol: 136 (07/29/2009)   HDL: 32 (07/29/2009)   LDL: 87 (07/29/2009)   TG: 85 (07/29/2009)  Problem # 3:  MAJOR DEPRESSIVE DISORDER SINGLE EPISODE MILD (ICD-296.21) Assessment: Unchanged Patient is stable. Will continue to follow Psychiatry recommendations. will continue with Celexa, lamictal and Trazadone.  Complete Medication List: 1)  Tramadol Hcl 50 Mg Tabs (Tramadol hcl) .... Take 2 tablets by mouth four times a day 2)  Carvedilol 25 Mg Tabs (Carvedilol) .... Take 1 tablet twice a day. 3)  Bayer Aspirin 325 Mg Tabs (Aspirin) .... Take 1 tablet by mouth once a day 4)  Gabapentin 300 Mg Caps (Gabapentin) .... Taper up to three tabs three times a day 5)  Allopurinol 300 Mg Tabs (Allopurinol) .Marland Kitchen.. 1 tab daily 6)  Ventolin Hfa 108 (90 Base) Mcg/act Aers (Albuterol sulfate) .... Take two puffs by mouth every 6 hours  as needed 7)  Zocor 40 Mg Tabs (Simvastatin) .... Take 1 tab by mouth at bedtime 8)  Omeprazole 40 Mg Cpdr (Omeprazole) .... Take 1 tablet by mouth two times a day 9)  Amlodipine Besylate 10 Mg Tabs (Amlodipine besylate) .... Take 1 tablet by mouth once a day 10)  Nitrostat 0.3 Mg Subl (Nitroglycerin) .... Take one tablet for chest pain as needed. may repeat tw more times 5 minutes apart. 11)  Sertraline Hcl 100 Mg Tabs (Sertraline hcl) .... Take 1 tablet by mouth once a day 12)  Lamotrigine 25 Mg Tabs (Lamotrigine) .... Tapper 13)  Nebulizer/adult Mask Kit (Respiratory therapy supplies) .... Use with albuterol and ipratropium q 6 hrs as needed 14)  Nebulizer Misc (Nebulizers) ....  Use with albuterol and impratropium q 6 hours as needed 15)  Qvar 40 Mcg/act Aers (Beclomethasone dipropionate) .... Two puffs two times a day as needed 16)  Albuterol Sulfate (2.5 Mg/27ml) 0.083% Nebu (Albuterol sulfate) .... Use one vial per nebulizer treatemnt q 6 hours as needed 17)  Ipratropium Bromide 0.02 % Soln (Ipratropium bromide) .... Use one vial with albuterol  6 hours as needed with nebulizer 18)  Furosemide 20 Mg Tabs (Furosemide) .... Take 1 tablet by mouth once a day in the morning 19)  Aerogear Action Asthma Kit Kit (Peak flow meter-inh assist dev) .... Use with all your inhalers as instructed 20)  Depend Overnight Briefs Large Misc (Incontinence supply disposable) .... Use as directed as needed 21)  Padsorber Bed Pan Liners Misc (Incontinence supply disposable) .... Use as needed 22)  Round Shower Stool Misc (Misc. devices) .... Use while taking showers to prevent falls 23)  E-z Lock Raised Toilet Seat Misc (Misc. devices) .... Please, use as directed  Other Orders: Gastroenterology Referral (GI)  Patient Instructions: 1)  Please, follow up with a gynecologist for pelvic pain. 2)  Please, call with any questions.   Orders Added: 1)  Gastroenterology Referral [GI] 2)  T-Basic Metabolic Panel [phone] 3)  Gynecologic Referral [Gyn] 4)  Est. Patient Level III [10258]    Prevention & Chronic Care Immunizations   Influenza vaccine: Not documented   Influenza vaccine deferral: Refused  (11/24/2008)    Tetanus booster: Not documented   Td booster deferral: Not indicated  (02/02/2009)   Tetanus booster due: 02/03/2019    Pneumococcal vaccine: Not documented   Pneumococcal vaccine due: 12/19/2017  Colorectal Screening   Hemoccult: Not documented    Colonoscopy: Not documented   Colonoscopy action/deferral: GI referral  (02/03/2010)  Other Screening   Pap smear: NEGATIVE FOR INTRAEPITHELIAL LESIONS OR MALIGNANCY.  (11/24/2008)   Pap smear action/deferral:  Ordered  (11/24/2008)   Pap smear due: 11/25/2011    Mammogram: ASSESSMENT: Negative - BI-RADS 1^MM DIGITAL SCREENING  (11/17/2008)   Mammogram action/deferral: Ordered  (02/03/2010)   Smoking status: never  (02/03/2010)  Lipids   Total Cholesterol: 136  (07/29/2009)   Lipid panel action/deferral: Lipid Panel ordered   LDL: 87  (07/29/2009)   LDL Direct: Not documented   HDL: 32  (07/29/2009)   Triglycerides: 85  (07/29/2009)   Lipid panel due: 07/30/2010    SGOT (AST): 15  (07/29/2009)   BMP action: Ordered   SGPT (ALT): 9  (07/29/2009)   Alkaline phosphatase: 137  (07/29/2009)   Total bilirubin: 0.5  (07/29/2009)   Liver panel due: 07/30/2010    Lipid flowsheet reviewed?: Yes   Progress toward LDL goal: Unchanged    Stage of readiness to change (lipid management): Maintenance  Hypertension   Last Blood Pressure: 132 / 86  (02/03/2010)   Serum creatinine: 2.11  (01/10/2010)   BMP action: Ordered   Serum potassium 4.3  (01/10/2010)   Basic metabolic panel due: 05/05/2010    Hypertension flowsheet reviewed?: Yes   Progress toward BP goal: At goal    Stage of readiness to change (hypertension management): Maintenance  Self-Management Support :   Personal Goals (by the next clinic visit) :      Personal blood pressure goal: 140/90  (05/31/2009)     Personal LDL goal: 100  (05/31/2009)    Hypertension self-management support: Written self-care plan  (01/10/2010)    Lipid self-management support: Written self-care plan  (01/10/2010)    Nursing Instructions: Give Pneumovax today GI referral for screening colonoscopy (see order) Schedule screening mammogram (see order)   Process Orders Check Orders Results:     [facility]: Order checked:     [name] NOT AUTHORIZED TO ORDER Tests Sent for requisitioning (February 04, 2010 9:33 PM):     02/03/2010: [facility] -- T-Basic Metabolic Panel [phone] (signed)

## 2010-03-30 NOTE — Progress Notes (Signed)
Summary: gi/ hla  Phone Note Call from Patient   Summary of Call: pt called for address, ph[facility] of [facility] Initial call taken by: Marin Roberts RN,  February 10, 2010 5:05 PM

## 2010-03-30 NOTE — Letter (Signed)
Summary: [facility] ORDER  [facility] ORDER   Imported By: Margie Billet 03/24/2010 16:15:37  _____________________________________________________________________  External Attachment:    Type:   Image     Comment:   External Document

## 2010-03-30 NOTE — Consult Note (Signed)
Summary: [facility]   Imported By: Margie Billet 02/07/2010 14:37:05  _____________________________________________________________________  External Attachment:    Type:   Image     Comment:   External Document

## 2010-03-30 NOTE — Progress Notes (Signed)
Summary: WHEELCHAIR/ HLA  Phone Note Call from Patient   Summary of Call: pt calls to say [facility] YOU AND HAVE A BLESSED CHRISTMAS, her CHRISTMAS WILL BE BETTER BECAUSE SHE GOT HER WH/CH TODAY!!! Initial call taken by: Marin Roberts RN,  February 15, 2010 2:14 PM  Follow-up for Phone Call        Barrington Worley Notified Follow-up by: [name],  February 21, 2010 10:11 AM

## 2010-03-30 NOTE — Miscellaneous (Signed)
Summary: [facility] BIPAP SET UP INFO  [facility] BIPAP SET UP INFO   Imported By: Shon Hough 03/10/2010 12:06:27  _____________________________________________________________________  External Attachment:    Type:   Image     Comment:   External Document

## 2010-03-30 NOTE — Letter (Signed)
Summary: [facility]  [facility]   Imported By: Margie Billet 03/24/2010 16:16:14  _____________________________________________________________________  External Attachment:    Type:   Image     Comment:   External Document

## 2010-03-30 NOTE — Progress Notes (Addendum)
Summary: Refill/gh  Phone Note Refill Request Message from:  Patient on March 15, 2010 1:50 PM  Refills Requested: Medication #1:  citalopram 40 mg 1 po daily  Medication #2:  GABAPENTIN 300 MG CAPS taper up to three tabs three times a day  Medication #3:  AMLODIPINE BESYLATE 10 MG TABS Take 1 tablet by mouth once a day  Medication #4:  Trazadone Was put back on Citalopram 40 mg daily by the Psychiatrist.  Trazadone    Method Requested: Electronic Initial call taken by: Angelina Ok RN,  March 15, 2010 1:52 PM  Follow-up for Phone Call        Rx completed in Dr. Tiajuana Amass Follow-up by: [name],  March 15, 2010 6:55 PM    New/Updated Medications: CITALOPRAM HYDROBROMIDE 40 MG TABS (CITALOPRAM HYDROBROMIDE) Take one tablet daily Prescriptions: CITALOPRAM HYDROBROMIDE 40 MG TABS (CITALOPRAM HYDROBROMIDE) Take one tablet daily  #30 x 11   Entered and Authorized by:   [name]   Signed by:   [name] on 03/15/2010   Method used:   Electronically to        [facility] [phone]* (retail)       [address], [place]  [address]       Ph: [phone] or [phone]       Fax: [phone]   RxID:   [id number] GABAPENTIN 300 MG CAPS (GABAPENTIN) taper up to three tabs three times a day  #270 x 11   Entered and Authorized by:   [name]   Signed by:   [name] on 03/15/2010   Method used:   [facility]ronically to        [facility] [phone]* (retail)       [address] [place]  [address]       Ph: [phone] or [phone]       Fax: [phone]   RxID:   [id number] AMLODIPINE BESYLATE 10 MG TABS (AMLODIPINE BESYLATE) Take 1 tablet by mouth once a day  #30 x 11   Entered and Authorized by:   [name]   Signed by:   [name] on 03/15/2010   Method used:   Electronically to        [facility] [phone]* (retail)       [address]       [place], [place]   [address]       Ph: [phone] or [phone]       Fax: [phone]   RxID:   [id number]

## 2010-03-30 NOTE — Letter (Signed)
Summary: [facility]   Imported By: Shon Hough 02/09/2010 09:37:57  _____________________________________________________________________  External Attachment:    Type:   Image     Comment:   External Document

## 2010-04-20 ENCOUNTER — Emergency Department (HOSPITAL_COMMUNITY)
Admission: EM | Admit: 2010-04-20 | Discharge: 2010-04-20 | Disposition: A | Discharge status: Home / Self Care | Payer: Medicaid Other | Attending: Emergency Medicine | Admitting: Emergency Medicine

## 2010-04-20 DIAGNOSIS — G8929 Other chronic pain: Secondary | ICD-10-CM | POA: Insufficient documentation

## 2010-04-20 DIAGNOSIS — Z8639 Personal history of other endocrine, nutritional and metabolic disease: Secondary | ICD-10-CM | POA: Insufficient documentation

## 2010-04-20 DIAGNOSIS — M129 Arthropathy, unspecified: Secondary | ICD-10-CM | POA: Insufficient documentation

## 2010-04-20 DIAGNOSIS — R209 Unspecified disturbances of skin sensation: Secondary | ICD-10-CM | POA: Insufficient documentation

## 2010-04-20 DIAGNOSIS — F3289 Other specified depressive episodes: Secondary | ICD-10-CM | POA: Insufficient documentation

## 2010-04-20 DIAGNOSIS — R252 Cramp and spasm: Secondary | ICD-10-CM | POA: Insufficient documentation

## 2010-04-20 DIAGNOSIS — Z7982 Long term (current) use of aspirin: Secondary | ICD-10-CM | POA: Insufficient documentation

## 2010-04-20 DIAGNOSIS — M549 Dorsalgia, unspecified: Secondary | ICD-10-CM | POA: Insufficient documentation

## 2010-04-20 DIAGNOSIS — I10 Essential (primary) hypertension: Secondary | ICD-10-CM | POA: Insufficient documentation

## 2010-04-20 DIAGNOSIS — R0789 Other chest pain: Secondary | ICD-10-CM | POA: Insufficient documentation

## 2010-04-20 DIAGNOSIS — I252 Old myocardial infarction: Secondary | ICD-10-CM | POA: Insufficient documentation

## 2010-04-20 DIAGNOSIS — K219 Gastro-esophageal reflux disease without esophagitis: Secondary | ICD-10-CM | POA: Insufficient documentation

## 2010-04-20 DIAGNOSIS — F329 Major depressive disorder, single episode, unspecified: Secondary | ICD-10-CM | POA: Insufficient documentation

## 2010-04-20 DIAGNOSIS — Z79899 Other long term (current) drug therapy: Secondary | ICD-10-CM | POA: Insufficient documentation

## 2010-04-20 DIAGNOSIS — R51 Headache: Secondary | ICD-10-CM | POA: Insufficient documentation

## 2010-04-20 DIAGNOSIS — Z862 Personal history of diseases of the blood and blood-forming organs and certain disorders involving the immune mechanism: Secondary | ICD-10-CM | POA: Insufficient documentation

## 2010-04-20 DIAGNOSIS — I2510 Atherosclerotic heart disease of native coronary artery without angina pectoris: Secondary | ICD-10-CM | POA: Insufficient documentation

## 2010-04-22 ENCOUNTER — Encounter: Payer: Self-pay | Admitting: Internal Medicine

## 2010-04-22 ENCOUNTER — Emergency Department (HOSPITAL_COMMUNITY): Payer: Medicaid Other

## 2010-04-22 ENCOUNTER — Emergency Department (HOSPITAL_COMMUNITY)
Admission: EM | Admit: 2010-04-22 | Discharge: 2010-04-22 | Disposition: A | Discharge status: Other Acute Inpatient Hospital | Payer: Medicaid Other | Source: Home / Self Care | Attending: Emergency Medicine | Admitting: Emergency Medicine

## 2010-04-22 ENCOUNTER — Observation Stay (HOSPITAL_COMMUNITY)
Admission: RE | Admit: 2010-04-22 | Discharge: 2010-04-24 | DRG: 202 | Disposition: A | Discharge status: Home / Self Care | Payer: Medicaid Other | Source: Ambulatory Visit | Attending: Infectious Disease | Admitting: Infectious Disease

## 2010-04-22 DIAGNOSIS — Z01810 Encounter for preprocedural cardiovascular examination: Secondary | ICD-10-CM | POA: Insufficient documentation

## 2010-04-22 DIAGNOSIS — N189 Chronic kidney disease, unspecified: Secondary | ICD-10-CM | POA: Insufficient documentation

## 2010-04-22 DIAGNOSIS — R059 Cough, unspecified: Secondary | ICD-10-CM | POA: Insufficient documentation

## 2010-04-22 DIAGNOSIS — R0789 Other chest pain: Secondary | ICD-10-CM | POA: Insufficient documentation

## 2010-04-22 DIAGNOSIS — F329 Major depressive disorder, single episode, unspecified: Secondary | ICD-10-CM | POA: Insufficient documentation

## 2010-04-22 DIAGNOSIS — I10 Essential (primary) hypertension: Secondary | ICD-10-CM | POA: Insufficient documentation

## 2010-04-22 DIAGNOSIS — M129 Arthropathy, unspecified: Secondary | ICD-10-CM | POA: Insufficient documentation

## 2010-04-22 DIAGNOSIS — F3289 Other specified depressive episodes: Secondary | ICD-10-CM | POA: Insufficient documentation

## 2010-04-22 DIAGNOSIS — J45901 Unspecified asthma with (acute) exacerbation: Secondary | ICD-10-CM | POA: Insufficient documentation

## 2010-04-22 DIAGNOSIS — I129 Hypertensive chronic kidney disease with stage 1 through stage 4 chronic kidney disease, or unspecified chronic kidney disease: Secondary | ICD-10-CM | POA: Insufficient documentation

## 2010-04-22 DIAGNOSIS — F411 Generalized anxiety disorder: Secondary | ICD-10-CM | POA: Insufficient documentation

## 2010-04-22 DIAGNOSIS — N179 Acute kidney failure, unspecified: Secondary | ICD-10-CM | POA: Insufficient documentation

## 2010-04-22 DIAGNOSIS — J45909 Unspecified asthma, uncomplicated: Secondary | ICD-10-CM

## 2010-04-22 DIAGNOSIS — E7800 Pure hypercholesterolemia, unspecified: Secondary | ICD-10-CM | POA: Insufficient documentation

## 2010-04-22 DIAGNOSIS — R05 Cough: Secondary | ICD-10-CM | POA: Insufficient documentation

## 2010-04-22 DIAGNOSIS — M109 Gout, unspecified: Secondary | ICD-10-CM | POA: Insufficient documentation

## 2010-04-22 DIAGNOSIS — I252 Old myocardial infarction: Secondary | ICD-10-CM | POA: Insufficient documentation

## 2010-04-22 DIAGNOSIS — J984 Other disorders of lung: Secondary | ICD-10-CM | POA: Insufficient documentation

## 2010-04-22 DIAGNOSIS — I2510 Atherosclerotic heart disease of native coronary artery without angina pectoris: Secondary | ICD-10-CM | POA: Insufficient documentation

## 2010-04-22 LAB — DIFFERENTIAL
Basophils Relative: 0 % (ref 0–1)
Lymphs Abs: 0.8 10*3/uL (ref 0.7–4.0)
Monocytes Absolute: 0.1 10*3/uL (ref 0.1–1.0)
Monocytes Relative: 1 % — ABNORMAL LOW (ref 3–12)
Neutro Abs: 6.3 10*3/uL (ref 1.7–7.7)

## 2010-04-22 LAB — CBC
Hemoglobin: 11.7 g/dL — ABNORMAL LOW (ref 12.0–15.0)
MCH: 28.3 pg (ref 26.0–34.0)
MCHC: 32.1 g/dL (ref 30.0–36.0)
MCV: 88.1 fL (ref 78.0–100.0)

## 2010-04-22 LAB — POCT I-STAT, CHEM 8
BUN: 46 mg/dL — ABNORMAL HIGH (ref 6–23)
Calcium, Ion: 1.11 mmol/L — ABNORMAL LOW (ref 1.12–1.32)
Chloride: 107 mEq/L (ref 96–112)
Creatinine, Ser: 3.1 mg/dL — ABNORMAL HIGH (ref 0.4–1.2)
Sodium: 140 mEq/L (ref 135–145)
TCO2: 24 mmol/L (ref 0–100)

## 2010-04-23 ENCOUNTER — Inpatient Hospital Stay (HOSPITAL_COMMUNITY): Payer: Medicaid Other

## 2010-04-23 LAB — BASIC METABOLIC PANEL
CO2: 27 mEq/L (ref 19–32)
Calcium: 9 mg/dL (ref 8.4–10.5)
Calcium: 8.2 mg/dL — ABNORMAL LOW (ref 8.4–10.5)
Calcium: 8.3 mg/dL — ABNORMAL LOW (ref 8.4–10.5)
Chloride: 97 mEq/L (ref 96–112)
Creatinine, Ser: 2.56 mg/dL — ABNORMAL HIGH (ref 0.4–1.2)
Creatinine, Ser: 2.31 mg/dL — ABNORMAL HIGH (ref 0.4–1.2)
GFR calc Af Amer: 23 mL/min — ABNORMAL LOW (ref >60)
GFR calc Af Amer: 26 mL/min — ABNORMAL LOW (ref >60)
GFR calc Af Amer: 29 mL/min — ABNORMAL LOW (ref >60)
GFR calc non Af Amer: 22 mL/min — ABNORMAL LOW (ref >60)
GFR calc non Af Amer: 24 mL/min — ABNORMAL LOW (ref >60)
Sodium: 137 mEq/L (ref 135–145)

## 2010-04-23 LAB — COMPREHENSIVE METABOLIC PANEL
ALT: 11 U/L (ref 0–35)
AST: 17 U/L (ref 0–37)
Alkaline Phosphatase: 99 U/L (ref 39–117)
CO2: 24 mEq/L (ref 19–32)
Chloride: 97 mEq/L (ref 96–112)
GFR calc Af Amer: 23 mL/min — ABNORMAL LOW (ref >60)
GFR calc non Af Amer: 19 mL/min — ABNORMAL LOW (ref >60)
Potassium: 4 mEq/L (ref 3.5–5.1)
Sodium: 128 mEq/L — ABNORMAL LOW (ref 135–145)
Total Bilirubin: 0.6 mg/dL (ref 0.3–1.2)

## 2010-04-23 LAB — SODIUM, URINE, RANDOM: Sodium, Ur: 97 mEq/L

## 2010-04-23 LAB — CBC
Hemoglobin: 11.2 g/dL — ABNORMAL LOW (ref 12.0–15.0)
MCH: 28.4 pg (ref 26.0–34.0)
MCHC: 33.4 g/dL (ref 30.0–36.0)
Platelets: 233 10*3/uL (ref 150–400)

## 2010-04-23 LAB — LIPID PANEL
LDL Cholesterol: 144 mg/dL — ABNORMAL HIGH (ref 0–99)
VLDL: 8 mg/dL (ref 0–40)

## 2010-04-24 DIAGNOSIS — J45909 Unspecified asthma, uncomplicated: Secondary | ICD-10-CM

## 2010-04-25 NOTE — Miscellaneous (Signed)
Summary: Hospital Admission  INTERNAL MEDICINE ADMISSION HISTORY AND PHYSICAL  Attending: Dr. Daiva Eves  First contact: Dr. Anselm Jungling [phone] Second contact: Dr. Denton Meek  [phone] [facility], after-hours: [phone], [phone])   PCP: Denton Meek  CC: SOB  HPI:  Patient is a 60 y/o female with h/o asthma (PFTs on file showing obstructive pattern), anxiety disorder, CAD, HTN, morbid obesity who is here on transfer from [facility] ED for "asthma attack." Patient states that earlier today, she became suddenly short of breath and couldn't find her inhaler, so panicked and went to the ED. She states she uses her rescue inhaler about almost everyday, although questionable as patient doesn't seem to be knowledgeable about what meds she is supposed to be taking. She has also had a dry cough with this, however denies any chest pain, fever, chills, palpitations, nightsweats, abdmoninal pain, n/v/d, changes in bowel habits, dysuria, hematuria, weakness, numbness or any other systemic symptoms. She states that she is currently under a lot of emotional stress as today marks the anniversary of her daughter's death. She had visited the grave site prior to her having the asthma attack. At [facility] ED she was given Solumedrol 125mg  and was stable after getting albuterol en route to ED.  ALLERGIES: ! ACE INHIBITORS ! DARVOCET ! * DILAUDID  PAST MEDICAL HISTORY: Morbid obesity Hypertension Hyperlipidemia Coronary artery disease- non-Q wave MI's in '04, '05;     cath 9/06 with 10-15% left circumflex, diffuse OM and RCA disease.      Myoview in 05/08: Fixed defect at the apex and inferior wall.  No stress inducedischemia. Sleep apnea- mild to moderate per sleep study 3/07 Anxiety Alcohol abuse- sober since 11/06?, Relapsed (EtOH 134 on 06/12/08) GERD Gout (clinical Dx. No arthrocentesis.) S/p hysterectomy. Chronic renal insufficiency (baseline Cr 1.2-1.6)  MEDICATIONS: TRAMADOL HCL 50 MG TABS (TRAMADOL HCL) take 2 tablets by  mouth four times a day CARVEDILOL 25 MG TABS (CARVEDILOL) take 1 tablet twice a day. BAYER ASPIRIN 325 MG TABS (ASPIRIN) Take 1 tablet by mouth once a day GABAPENTIN 300 MG CAPS (GABAPENTIN) taper up to three tabs three times a day ALLOPURINOL 300 MG TABS (ALLOPURINOL) 1 tab daily VENTOLIN HFA 108 (90 BASE) MCG/ACT AERS (ALBUTEROL SULFATE) Take two puffs by mouth every 6 hours  as needed ZOCOR 40 MG TABS (SIMVASTATIN) Take 1 tab by mouth at bedtime OMEPRAZOLE 40 MG CPDR (OMEPRAZOLE) Take 1 tablet by mouth two times a day AMLODIPINE BESYLATE 10 MG TABS (AMLODIPINE BESYLATE) Take 1 tablet by mouth once a day NITROSTAT 0.3 MG SUBL (NITROGLYCERIN) Take one tablet for chest pain as needed. May repeat tw more times 5 minutes apart. SERTRALINE HCL 100 MG TABS (SERTRALINE HCL) Take 1 tablet by mouth once a day LAMOTRIGINE 25 MG TABS (LAMOTRIGINE) tapper NEBULIZER/ADULT MASK  KIT (RESPIRATORY THERAPY SUPPLIES) use with albuterol and ipratropium q 6 hrs as needed NEBULIZER  MISC (NEBULIZERS) Use with albuterol and impratropium q 6 hours as needed QVAR 40 MCG/ACT AERS (BECLOMETHASONE DIPROPIONATE) Two puffs two times a day as needed ALBUTEROL SULFATE (2.5 MG/3ML) 0.083% NEBU (ALBUTEROL SULFATE) use one vial per nebulizer treatemnt q 6 hours as needed IPRATROPIUM BROMIDE 0.02 % SOLN (IPRATROPIUM BROMIDE) use one vial with albuterol 6 hours as needed with nebulizer FUROSEMIDE 20 MG TABS (FUROSEMIDE) Take 1 tablet by mouth once a day in the morning AEROGEAR ACTION ASTHMA KIT  KIT (PEAK FLOW METER-INH ASSIST DEV) Use with all your inhalers as instructed DEPEND OVERNIGHT BRIEFS LARGE  MISC (INCONTINENCE  SUPPLY DISPOSABLE) use as directed as needed PADSORBER BED PAN LINERS  MISC (INCONTINENCE SUPPLY DISPOSABLE) Use as needed ROUND SHOWER STOOL  MISC (MISC. DEVICES) use while taking showers to prevent falls E-Z LOCK RAISED TOILET SEAT  MISC (MISC. DEVICES) Please, use as directed CITALOPRAM HYDROBROMIDE 40 MG  TABS (CITALOPRAM HYDROBROMIDE) Take one tablet daily   SOCIAL HISTORY: Social History: No smoking,  quit etoh this year, relapsed, reports sober 07/19/08 no illegal drugs.   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  FAMILY HISTORY Father died of unknown cause  Mother died with alzheimer's Siblings with DM, no known CAD  ROS:per HPI  VITALS: T:98.3  P: 67 BP:188/105-> 167/90  R: 23  O2 SAT: 93% ON: 2 L via [place]   PHYSICAL EXAM: General:  alert, well-developed, and cooperative to examination.   Head:  normocephalic and atraumatic.   Eyes:  vision grossly intact, pupils equal, pupils round, pupils reactive to light, no injection and anicteric.   Mouth:  pharynx pink and moist, no erythema, and no exudates.   Neck:  supple, full ROM, no thyromegaly, no JVD, and no carotid bruits.   Lungs: CTAB, no accessory muscle use, expiratory wheezing. CV: RRR, no M, S3, S4, no lifts or rubs. Abdomen: ND; BS+, NTTP, no HSM MSK: FROM of all extremities proximately and distally bialterally; no joint erythema, effusion or increased warmth to touch bilaterally. Neurologic:  alert & oriented X3, cranial nerves II-XII intact, strength normal in all extremities, sensation intact to light touch, and gait normal.   Skin:  turgor normal and no rashes.   Psych:  Oriented X3, memory intact for recent and remote, normally interactive, good eye contact, not anxious appearing, and not depressed appearing.  LABS:   WBC                                      7.2               4.0-10.5         K/uL  RBC                                      4.13              3.87-5.11        MIL/uL  Hemoglobin (HGB)                         11.7       l      12.0-15.0        g/dL  Hematocrit (HCT)                         36.4              36.0-46.0        %  MCV                                      88.1              78.0-100.0       fL  MCH -  28.3              26.0-34.0        pg  MCHC                                      32.1              30.0-36.0        g/dL  RDW                                      13.3              11.5-15.5        %  Platelet Count (PLT)                     220               150-400          K/uL  Neutrophils, %                           88         h      43-77            %  Lymphocytes, %                           11         l      12-46            %  Monocytes, %                             1          l      3-12             %  Eosinophils, %                           0                 0-5              %  Basophils, %                             0                 0-1              %  Neutrophils, Absolute                    6.3               1.7-7.7          K/uL  Lymphocytes, Absolute                    0.8               0.7-4.0          K/uL  Monocytes, Absolute  0.1               0.1-1.0          K/uL  Eosinophils, Absolute                    0.0               0.0-0.7          K/uL  Basophils, Absolute                      0.0               0.0-0.1          K/uL  TCO2                                     24                0-100            mmol/L  Ionized Calcium                          1.11       l      1.12-1.32        mmol/L  Hemoglobin (HGB)                         12.9              12.0-15.0        g/dL  Hematocrit (HCT)                         38.0              36.0-46.0        %  Sodium (NA)                              140               135-145          mEq/L  Potassium (K)                            4.2               3.5-5.1          mEq/L  Chloride                                 107               96-112           mEq/L  Glucose                                  146        h      70-99            mg/dL  BUN  46         h      6-23             mg/dL  Creatinine                               3.1        h      0.4-1.2          mg/dL  CXR (Portable): IMPRESSION:  No acute  abnormalities.   ASSESSMENT/PLAN:  60 y/o woman with h/o asthma and anxiety as well as CAD, HTN presenting with sob.   1. SOB -likely 2/2 asthma exercabation in the setting of not using her inhaler vs anxiety attack given recent psychosocial stressor. Stable and improved after albuterol en route to ED and given Solumedrol at [facility] ED. CXR not concerning for volume overload and there is no infiltrate to suggest the presense of infection. She is also afebrile and no white count on CBC. Electrolytes also within normal limits. - patient currently stable on RA. Continue inhalers and nebs as needed and restart her QVAR. - 02 as needed to keep sats > 92% - Xanax for anxiety - patient education - f/u am labs -> cbc, cmet 2. HTN- cont Coreg and Amlodipine 3. Depression/anxiety - cont Celexa and Sertraline. Xanax per above. 4. Gout - stable. cont Allopurinol 5. CAD- cont ASA, coreg and Zocor 6. CKD: Cr 3.1 and her baseline 1.8, recent Ct of abdomen/pelvis unremarkable, this may be due to pre-renal from dehydration, will check Urine Na and Cr and give more fluids. Hold lasix. Check BMET in AM.  6. DVT ppx- Lovenox   ATTENDING PHYSICIAN: I discussed the case with the resident(s) as noted ad reviewed the resident's notes. I agree with the finding and plan - please refer to the attending physician note for more details.  Signature__________________________________________  Printed Name_______________________________________

## 2010-05-08 LAB — COMPREHENSIVE METABOLIC PANEL
ALT: 13 U/L (ref 0–35)
Alkaline Phosphatase: 103 U/L (ref 39–117)
Alkaline Phosphatase: 97 U/L (ref 39–117)
BUN: 15 mg/dL (ref 6–23)
BUN: 15 mg/dL (ref 6–23)
CO2: 24 mEq/L (ref 19–32)
CO2: 25 mEq/L (ref 19–32)
Chloride: 100 mEq/L (ref 96–112)
GFR calc non Af Amer: 29 mL/min — ABNORMAL LOW (ref >60)
GFR calc non Af Amer: 41 mL/min — ABNORMAL LOW (ref >60)
Glucose, Bld: 97 mg/dL (ref 70–99)
Glucose, Bld: 116 mg/dL — ABNORMAL HIGH (ref 70–99)
Potassium: 4 mEq/L (ref 3.5–5.1)
Potassium: 3.8 mEq/L (ref 3.5–5.1)
Total Bilirubin: 1.1 mg/dL (ref 0.3–1.2)
Total Protein: 9.6 g/dL — ABNORMAL HIGH (ref 6.0–8.3)
Total Protein: 8.5 g/dL — ABNORMAL HIGH (ref 6.0–8.3)

## 2010-05-08 LAB — URINE CULTURE
Colony Count: >=100000
Colony Count: NO GROWTH
Culture: NO GROWTH

## 2010-05-08 LAB — CBC
HCT: 41.1 % (ref 36.0–46.0)
Hemoglobin: 14 g/dL (ref 12.0–15.0)
Hemoglobin: 13.5 g/dL (ref 12.0–15.0)
MCH: 29.7 pg (ref 26.0–34.0)
MCHC: 34.1 g/dL (ref 30.0–36.0)
MCHC: 34.4 g/dL (ref 30.0–36.0)
MCV: 85.4 fL (ref 78.0–100.0)
MCV: 86.2 fL (ref 78.0–100.0)
Platelets: 231 10*3/uL (ref 150–400)
RBC: 4.55 MIL/uL (ref 3.87–5.11)
RDW: 13.7 % (ref 11.5–15.5)
WBC: 7.1 10*3/uL (ref 4.0–10.5)

## 2010-05-08 LAB — DIFFERENTIAL
Basophils Absolute: 0 10*3/uL (ref 0.0–0.1)
Basophils Absolute: 0 10*3/uL (ref 0.0–0.1)
Basophils Relative: 0 % (ref 0–1)
Basophils Relative: 0 % (ref 0–1)
Eosinophils Absolute: 0 10*3/uL (ref 0.0–0.7)
Monocytes Relative: 5 % (ref 3–12)
Neutro Abs: 4.9 10*3/uL (ref 1.7–7.7)
Neutro Abs: 3.8 10*3/uL (ref 1.7–7.7)
Neutrophils Relative %: 69 % (ref 43–77)
Neutrophils Relative %: 66 % (ref 43–77)

## 2010-05-08 LAB — URINALYSIS, ROUTINE W REFLEX MICROSCOPIC
Glucose, UA: NEGATIVE mg/dL
Hgb urine dipstick: NEGATIVE
Ketones, ur: 15 mg/dL — AB
Nitrite: NEGATIVE
Nitrite: NEGATIVE
Nitrite: NEGATIVE
Protein, ur: 30 mg/dL — AB
Protein, ur: >300 mg/dL — AB
Specific Gravity, Urine: 1.022 (ref 1.005–1.030)
Specific Gravity, Urine: 1.036 — ABNORMAL HIGH (ref 1.005–1.030)
Urobilinogen, UA: 1 mg/dL (ref 0.0–1.0)
Urobilinogen, UA: 2 mg/dL — ABNORMAL HIGH (ref 0.0–1.0)
Urobilinogen, UA: 1 mg/dL (ref 0.0–1.0)

## 2010-05-08 LAB — RAPID URINE DRUG SCREEN, HOSP PERFORMED
Cocaine: NOT DETECTED
Opiates: NOT DETECTED

## 2010-05-08 LAB — URINE MICROSCOPIC-ADD ON

## 2010-05-08 LAB — LIPASE, BLOOD
Lipase: 25 U/L (ref 11–59)
Lipase: 32 U/L (ref 11–59)

## 2010-05-10 LAB — COMPREHENSIVE METABOLIC PANEL
ALT: 12 U/L (ref 0–35)
AST: 17 U/L (ref 0–37)
Alkaline Phosphatase: 108 U/L (ref 39–117)
CO2: 26 mEq/L (ref 19–32)
Calcium: 8.4 mg/dL (ref 8.4–10.5)
Chloride: 104 mEq/L (ref 96–112)
GFR calc Af Amer: >60 mL/min (ref >60)
GFR calc non Af Amer: 55 mL/min — ABNORMAL LOW (ref >60)
Glucose, Bld: 110 mg/dL — ABNORMAL HIGH (ref 70–99)
Potassium: 3.8 mEq/L (ref 3.5–5.1)
Sodium: 136 mEq/L (ref 135–145)
Total Bilirubin: 0.8 mg/dL (ref 0.3–1.2)

## 2010-05-10 LAB — CBC
HCT: 38 % (ref 36.0–46.0)
Hemoglobin: 12.7 g/dL (ref 12.0–15.0)
MCHC: 33.4 g/dL (ref 30.0–36.0)
RBC: 4.41 MIL/uL (ref 3.87–5.11)

## 2010-05-10 LAB — URINE MICROSCOPIC-ADD ON

## 2010-05-10 LAB — URINALYSIS, ROUTINE W REFLEX MICROSCOPIC
Bilirubin Urine: NEGATIVE
Hgb urine dipstick: NEGATIVE
Nitrite: NEGATIVE
Specific Gravity, Urine: 1.018 (ref 1.005–1.030)
pH: 6 (ref 5.0–8.0)

## 2010-05-10 LAB — URINE CULTURE: Culture  Setup Time: 201110201406

## 2010-05-10 LAB — POCT URINALYSIS DIPSTICK
Glucose, UA: NEGATIVE mg/dL
Nitrite: NEGATIVE
Urobilinogen, UA: 4 mg/dL — ABNORMAL HIGH (ref 0.0–1.0)

## 2010-05-10 LAB — DIFFERENTIAL
Basophils Absolute: 0 10*3/uL (ref 0.0–0.1)
Basophils Relative: 0 % (ref 0–1)
Eosinophils Absolute: 0.1 10*3/uL (ref 0.0–0.7)
Eosinophils Relative: 2 % (ref 0–5)
Lymphs Abs: 1.5 10*3/uL (ref 0.7–4.0)
Neutrophils Relative %: 65 % (ref 43–77)

## 2010-05-14 LAB — URINALYSIS, ROUTINE W REFLEX MICROSCOPIC
Glucose, UA: NEGATIVE mg/dL
Hgb urine dipstick: NEGATIVE
Hgb urine dipstick: NEGATIVE
Hgb urine dipstick: NEGATIVE
Nitrite: NEGATIVE
Protein, ur: 30 mg/dL — AB
Protein, ur: 30 mg/dL — AB
Urobilinogen, UA: 2 mg/dL — ABNORMAL HIGH (ref 0.0–1.0)
Urobilinogen, UA: 1 mg/dL (ref 0.0–1.0)
pH: 5 (ref 5.0–8.0)

## 2010-05-14 LAB — DIFFERENTIAL
Basophils Absolute: 0 10*3/uL (ref 0.0–0.1)
Basophils Absolute: 0 10*3/uL (ref 0.0–0.1)
Basophils Absolute: 0 10*3/uL (ref 0.0–0.1)
Basophils Relative: 0 % (ref 0–1)
Basophils Relative: 0 % (ref 0–1)
Basophils Relative: 0 % (ref 0–1)
Eosinophils Absolute: 0.1 10*3/uL (ref 0.0–0.7)
Eosinophils Absolute: 0.2 10*3/uL (ref 0.0–0.7)
Eosinophils Relative: 3 % (ref 0–5)
Lymphocytes Relative: 24 % (ref 12–46)
Lymphs Abs: 1.8 10*3/uL (ref 0.7–4.0)
Monocytes Absolute: 0.2 10*3/uL (ref 0.1–1.0)
Monocytes Relative: 8 % (ref 3–12)
Monocytes Relative: 5 % (ref 3–12)
Monocytes Relative: 6 % (ref 3–12)
Neutro Abs: 6.5 10*3/uL (ref 1.7–7.7)
Neutro Abs: 4.3 10*3/uL (ref 1.7–7.7)
Neutrophils Relative %: 65 % (ref 43–77)
Neutrophils Relative %: 71 % (ref 43–77)
Neutrophils Relative %: 82 % — ABNORMAL HIGH (ref 43–77)

## 2010-05-14 LAB — CBC
HCT: 30.4 % — ABNORMAL LOW (ref 36.0–46.0)
HCT: 35 % — ABNORMAL LOW (ref 36.0–46.0)
HCT: 38 % (ref 36.0–46.0)
Hemoglobin: 11.1 g/dL — ABNORMAL LOW (ref 12.0–15.0)
Hemoglobin: 11.9 g/dL — ABNORMAL LOW (ref 12.0–15.0)
Hemoglobin: 12.7 g/dL (ref 12.0–15.0)
MCH: 29.9 pg (ref 26.0–34.0)
MCH: 29.9 pg (ref 26.0–34.0)
MCHC: 33.6 g/dL (ref 30.0–36.0)
MCHC: 33 g/dL (ref 30.0–36.0)
MCHC: 34.1 g/dL (ref 30.0–36.0)
MCHC: 34.4 g/dL (ref 30.0–36.0)
MCV: 89.2 fL (ref 78.0–100.0)
MCV: 90.5 fL (ref 78.0–100.0)
MCV: 91.2 fL (ref 78.0–100.0)
MCV: 89.3 fL (ref 78.0–100.0)
Platelets: 221 10*3/uL (ref 150–400)
Platelets: 248 10*3/uL (ref 150–400)
Platelets: 258 10*3/uL (ref 150–400)
RBC: 3.96 MIL/uL (ref 3.87–5.11)
RBC: 4.25 MIL/uL (ref 3.87–5.11)
RDW: 13.7 % (ref 11.5–15.5)
RDW: 14.8 % (ref 11.5–15.5)
RDW: 14.8 % (ref 11.5–15.5)
RDW: 15 % (ref 11.5–15.5)
RDW: 18.8 % — ABNORMAL HIGH (ref 11.5–15.5)
WBC: 11 10*3/uL — ABNORMAL HIGH (ref 4.0–10.5)
WBC: 6.2 10*3/uL (ref 4.0–10.5)

## 2010-05-14 LAB — EPSTEIN-BARR VIRUS VCA ANTIBODY PANEL
EBV EA IgG: 1.59 {ISR} — ABNORMAL HIGH
EBV NA IgG: 2.47 {ISR} — ABNORMAL HIGH
EBV VCA IgG: 3.46 {ISR} — ABNORMAL HIGH
EBV VCA IgM: 0.23 {ISR}

## 2010-05-14 LAB — RAPID URINE DRUG SCREEN, HOSP PERFORMED
Amphetamines: NOT DETECTED
Barbiturates: NOT DETECTED
Barbiturates: NOT DETECTED
Benzodiazepines: NOT DETECTED
Benzodiazepines: NOT DETECTED
Cocaine: NOT DETECTED
Cocaine: NOT DETECTED

## 2010-05-14 LAB — BASIC METABOLIC PANEL
BUN: 13 mg/dL (ref 6–23)
BUN: 20 mg/dL (ref 6–23)
CO2: 27 mEq/L (ref 19–32)
CO2: 27 mEq/L (ref 19–32)
Calcium: 8.3 mg/dL — ABNORMAL LOW (ref 8.4–10.5)
Chloride: 104 mEq/L (ref 96–112)
Chloride: 105 mEq/L (ref 96–112)
Creatinine, Ser: 1.17 mg/dL (ref 0.4–1.2)
Creatinine, Ser: 1.34 mg/dL — ABNORMAL HIGH (ref 0.4–1.2)
Glucose, Bld: 122 mg/dL — ABNORMAL HIGH (ref 70–99)
Glucose, Bld: 128 mg/dL — ABNORMAL HIGH (ref 70–99)

## 2010-05-14 LAB — CULTURE, BLOOD (ROUTINE X 2)

## 2010-05-14 LAB — COMPREHENSIVE METABOLIC PANEL
AST: 18 U/L (ref 0–37)
AST: 19 U/L (ref 0–37)
Albumin: 3.8 g/dL (ref 3.5–5.2)
Alkaline Phosphatase: 97 U/L (ref 39–117)
BUN: 21 mg/dL (ref 6–23)
CO2: 26 mEq/L (ref 19–32)
CO2: 26 mEq/L (ref 19–32)
CO2: 30 mEq/L (ref 19–32)
Calcium: 8.4 mg/dL (ref 8.4–10.5)
Calcium: 9.5 mg/dL (ref 8.4–10.5)
Chloride: 101 mEq/L (ref 96–112)
Creatinine, Ser: 1.97 mg/dL — ABNORMAL HIGH (ref 0.4–1.2)
Creatinine, Ser: 1.36 mg/dL — ABNORMAL HIGH (ref 0.4–1.2)
Creatinine, Ser: 1.51 mg/dL — ABNORMAL HIGH (ref 0.4–1.2)
GFR calc Af Amer: 32 mL/min — ABNORMAL LOW (ref >60)
GFR calc Af Amer: 49 mL/min — ABNORMAL LOW (ref >60)
GFR calc non Af Amer: 26 mL/min — ABNORMAL LOW (ref >60)
GFR calc non Af Amer: 36 mL/min — ABNORMAL LOW (ref >60)
GFR calc non Af Amer: 40 mL/min — ABNORMAL LOW (ref >60)
Glucose, Bld: 101 mg/dL — ABNORMAL HIGH (ref 70–99)
Glucose, Bld: 106 mg/dL — ABNORMAL HIGH (ref 70–99)
Potassium: 4.7 mEq/L (ref 3.5–5.1)
Sodium: 136 mEq/L (ref 135–145)
Total Bilirubin: 0.7 mg/dL (ref 0.3–1.2)
Total Protein: 7.5 g/dL (ref 6.0–8.3)
Total Protein: 8.5 g/dL — ABNORMAL HIGH (ref 6.0–8.3)

## 2010-05-14 LAB — CMV ANTIBODY, IGG (EIA): CMV Ab - IgG: 7 IU/mL — ABNORMAL HIGH (ref <0.4)

## 2010-05-14 LAB — STREP A DNA PROBE: Group A Strep Probe: POSITIVE

## 2010-05-14 LAB — CLOSTRIDIUM DIFFICILE EIA: C difficile Toxins A+B, EIA: NEGATIVE

## 2010-05-14 LAB — ETHANOL: Alcohol, Ethyl (B): <5 mg/dL (ref 0–10)

## 2010-05-14 LAB — HIV-1 RNA ULTRAQUANT REFLEX TO GENTYP+: HIV 1 RNA Quant: <48 copies/mL (ref <48)

## 2010-05-14 LAB — CMV IGM: CMV IgM: <8 AU/mL (ref <30.0)

## 2010-05-14 LAB — GLUCOSE, CAPILLARY: Glucose-Capillary: 122 mg/dL — ABNORMAL HIGH (ref 70–99)

## 2010-05-14 LAB — URINE MICROSCOPIC-ADD ON

## 2010-05-14 LAB — LIPASE, BLOOD: Lipase: 26 U/L (ref 11–59)

## 2010-05-15 LAB — DIFFERENTIAL
Basophils Absolute: 0 10*3/uL (ref 0.0–0.1)
Basophils Relative: 1 % (ref 0–1)
Eosinophils Absolute: 0.2 10*3/uL (ref 0.0–0.7)
Eosinophils Relative: 4 % (ref 0–5)
Lymphocytes Relative: 32 % (ref 12–46)
Lymphs Abs: 1.7 10*3/uL (ref 0.7–4.0)
Monocytes Absolute: 0.3 10*3/uL (ref 0.1–1.0)
Monocytes Relative: 6 % (ref 3–12)
Neutrophils Relative %: 59 % (ref 43–77)

## 2010-05-15 LAB — POCT CARDIAC MARKERS
CKMB, poc: 1.6 ng/mL (ref 1.0–8.0)
Myoglobin, poc: 203 ng/mL (ref 12–200)
Troponin i, poc: <0.05 ng/mL (ref 0.00–0.09)

## 2010-05-15 LAB — CBC
HCT: 36.4 % (ref 36.0–46.0)
MCHC: 34.6 g/dL (ref 30.0–36.0)
MCV: 87.3 fL (ref 78.0–100.0)
Platelets: 198 10*3/uL (ref 150–400)
RDW: 14 % (ref 11.5–15.5)
RDW: 14.3 % (ref 11.5–15.5)

## 2010-05-15 LAB — COMPREHENSIVE METABOLIC PANEL
Albumin: 3.7 g/dL (ref 3.5–5.2)
Alkaline Phosphatase: 101 U/L (ref 39–117)
BUN: 17 mg/dL (ref 6–23)
Potassium: 3.9 mEq/L (ref 3.5–5.1)
Sodium: 135 mEq/L (ref 135–145)
Total Protein: 8.7 g/dL — ABNORMAL HIGH (ref 6.0–8.3)

## 2010-05-15 LAB — POCT I-STAT, CHEM 8
BUN: 20 mg/dL (ref 6–23)
Chloride: 107 mEq/L (ref 96–112)
Sodium: 139 mEq/L (ref 135–145)

## 2010-05-15 LAB — LIPASE, BLOOD: Lipase: 29 U/L (ref 11–59)

## 2010-05-16 ENCOUNTER — Ambulatory Visit (INDEPENDENT_AMBULATORY_CARE_PROVIDER_SITE_OTHER): Payer: Medicaid Other | Admitting: Internal Medicine

## 2010-05-16 ENCOUNTER — Encounter: Payer: Self-pay | Admitting: Internal Medicine

## 2010-05-16 DIAGNOSIS — J45901 Unspecified asthma with (acute) exacerbation: Secondary | ICD-10-CM

## 2010-05-16 DIAGNOSIS — N3010 Interstitial cystitis (chronic) without hematuria: Secondary | ICD-10-CM

## 2010-05-16 DIAGNOSIS — M2550 Pain in unspecified joint: Secondary | ICD-10-CM

## 2010-05-16 DIAGNOSIS — I10 Essential (primary) hypertension: Secondary | ICD-10-CM

## 2010-05-16 DIAGNOSIS — M1990 Unspecified osteoarthritis, unspecified site: Secondary | ICD-10-CM

## 2010-05-16 DIAGNOSIS — F331 Major depressive disorder, recurrent, moderate: Secondary | ICD-10-CM

## 2010-05-16 DIAGNOSIS — R259 Unspecified abnormal involuntary movements: Secondary | ICD-10-CM

## 2010-05-16 DIAGNOSIS — G252 Other specified forms of tremor: Secondary | ICD-10-CM

## 2010-05-16 DIAGNOSIS — R109 Unspecified abdominal pain: Secondary | ICD-10-CM

## 2010-05-16 DIAGNOSIS — N189 Chronic kidney disease, unspecified: Secondary | ICD-10-CM

## 2010-05-16 LAB — URINALYSIS
Leukocytes, UA: NEGATIVE
Nitrite: NEGATIVE
Specific Gravity, Urine: 1.016 (ref 1.005–1.030)
pH: 5.5 (ref 5.0–8.0)

## 2010-05-16 LAB — BASIC METABOLIC PANEL
Calcium: 8.5 mg/dL (ref 8.4–10.5)
Sodium: 133 mEq/L — ABNORMAL LOW (ref 135–145)

## 2010-05-16 MED ORDER — DILTIAZEM HCL ER 120 MG PO CP12: 120.0000 mg | ORAL_CAPSULE | Freq: Two times a day (BID) | ORAL | Status: DC

## 2010-05-16 NOTE — Assessment & Plan Note (Signed)
Multifactorial etiology: psychosomatic  Pain  In a setting of new Dx of interstitial cystitis. Patient is being treated at [facility].

## 2010-05-16 NOTE — Patient Instructions (Signed)
Please, take all  Your medications as prescribed. Please, STOP taking Ibuprofen (not good for kidney health) and STOP taking Amlodipine. Please, pick up a  New prescription for high blood pressure -> Cardizem one tablet by mouth daily. Please, follow up with a referral to  A kidney specialist. Please, call with any questions and follow up in 4 weeks or sooner if needed.

## 2010-05-16 NOTE — Assessment & Plan Note (Signed)
Stable. Uses walker and an Art gallery manager. Continue with tramadol and stop Ibuprofen due to CKD.

## 2010-05-16 NOTE — Assessment & Plan Note (Signed)
Controlled with current treatment

## 2010-05-16 NOTE — Assessment & Plan Note (Signed)
Stable. No side-effects with Lamotrigine, celexa. Takes trazadone and diazepam  PRN per psychiatrist.

## 2010-05-16 NOTE — Assessment & Plan Note (Signed)
Stable. Continue with flovent and Ventolin HFA

## 2010-05-16 NOTE — Progress Notes (Signed)
  Subjective:    Patient ID: Vanessa Palmer, female    DOB: [birth year], 59 y.o.   MRN: [id number]  HPI Hospital follow up appointment. Please, refer to a D/C summary. 1. Asthma--reports no new exacerbations; denies any wheezing, cough; uses Flovent and Albuterol HFA's as instructed. 2. CKD/hematuria --was evaluated by OB/GYN and [facility]. Patient is unable to provide any further Hx. 3. Bilateral UE tremors x 1 month. Patient reports "waking up one day with shakes." Reports no gradual onset or trauma. Tremors persist at rest and with purposeful movement. Denis any FMHx of tremors. Denies any change in her medication regimen with an exception of stopping zoloft per Dr. Clinton Gallant recs during last hospitalization in February, 2012. Patient was evaluated by  her psychiatrist who does not believe that her symptoms are due to her psych meds.    Review of Systems  HENT: Negative.   Eyes: Negative for visual disturbance.  Respiratory: Negative.   Cardiovascular: Negative.   Genitourinary: Positive for pelvic pain. Negative for dysuria, urgency, flank pain, decreased urine volume, vaginal bleeding, vaginal discharge, enuresis, difficulty urinating, genital sores and vaginal pain.  Musculoskeletal: Positive for back pain, arthralgias and gait problem.  Neurological: Positive for tremors and weakness. Negative for dizziness, facial asymmetry, light-headedness and headaches.  Psychiatric/Behavioral: Positive for decreased concentration. Negative for hallucinations, behavioral problems, confusion, dysphoric mood and agitation.       Objective:   Physical Exam  Constitutional: She is oriented to person, place, and time. She appears well-developed.  HENT:  Right Ear: External ear normal.  Left Ear: External ear normal.  Nose: Nose normal.  Mouth/Throat: Oropharynx is clear and moist.  Eyes: Conjunctivae are normal. Pupils are equal, round, and reactive to light.  Neck: Normal range of  motion. No JVD present. No thyromegaly present.  Cardiovascular: Normal rate, regular rhythm, normal heart sounds and intact distal pulses.   Pulmonary/Chest: Effort normal and breath sounds normal.  Abdominal: Soft. Bowel sounds are normal. She exhibits no distension. There is no tenderness. There is no rebound.  Musculoskeletal: Normal range of motion.  Lymphadenopathy:    She has no cervical adenopathy.  Neurological: She is alert and oriented to person, place, and time. She has normal reflexes. No cranial nerve deficit.  Skin: Skin is warm.  Psychiatric: She has a normal mood and affect. Her behavior is normal.          Assessment & Plan:

## 2010-05-16 NOTE — Assessment & Plan Note (Addendum)
Patient is with CKD. Will change from  Amlodipine to cardizem (patient is allergic to ACEI). Referred to a nephrologist for evaluation and further recommendations. To check i PTH; UPEP/SPE with reflex.

## 2010-05-21 LAB — CBC
Hemoglobin: 10.9 g/dL — ABNORMAL LOW (ref 12.0–15.0)
Hemoglobin: 11.9 g/dL — ABNORMAL LOW (ref 12.0–15.0)
MCHC: 33.2 g/dL (ref 30.0–36.0)
MCHC: 34.2 g/dL (ref 30.0–36.0)
MCV: 90.7 fL (ref 78.0–100.0)
Platelets: 180 10*3/uL (ref 150–400)
RBC: 3.54 MIL/uL — ABNORMAL LOW (ref 3.87–5.11)
RBC: 3.66 MIL/uL — ABNORMAL LOW (ref 3.87–5.11)
RBC: 3.86 MIL/uL — ABNORMAL LOW (ref 3.87–5.11)
RDW: 15.2 % (ref 11.5–15.5)
RDW: 15.3 % (ref 11.5–15.5)
RDW: 15.6 % — ABNORMAL HIGH (ref 11.5–15.5)
WBC: 4.9 10*3/uL (ref 4.0–10.5)

## 2010-05-21 LAB — DRUGS OF ABUSE SCREEN W/O ALC, ROUTINE URINE
Amphetamine Screen, Ur: NEGATIVE
Barbiturate Quant, Ur: NEGATIVE
Benzodiazepines.: NEGATIVE
Cocaine Metabolites: NEGATIVE
Methadone: NEGATIVE
Phencyclidine (PCP): NEGATIVE

## 2010-05-21 LAB — POCT CARDIAC MARKERS
CKMB, poc: 1.6 ng/mL (ref 1.0–8.0)
Myoglobin, poc: 144 ng/mL (ref 12–200)

## 2010-05-21 LAB — POCT I-STAT, CHEM 8
Creatinine, Ser: 1.4 mg/dL — ABNORMAL HIGH (ref 0.4–1.2)
HCT: 37 % (ref 36.0–46.0)
Hemoglobin: 12.6 g/dL (ref 12.0–15.0)
Potassium: 4.3 mEq/L (ref 3.5–5.1)
Sodium: 139 mEq/L (ref 135–145)

## 2010-05-21 LAB — COMPREHENSIVE METABOLIC PANEL
ALT: 12 U/L (ref 0–35)
Albumin: 3.1 g/dL — ABNORMAL LOW (ref 3.5–5.2)
Alkaline Phosphatase: 111 U/L (ref 39–117)
BUN: 21 mg/dL (ref 6–23)
Calcium: 8.4 mg/dL (ref 8.4–10.5)
Potassium: 4 mEq/L (ref 3.5–5.1)
Sodium: 135 mEq/L (ref 135–145)
Total Protein: 7.7 g/dL (ref 6.0–8.3)

## 2010-05-21 LAB — BASIC METABOLIC PANEL
CO2: 28 mEq/L (ref 19–32)
CO2: 31 mEq/L (ref 19–32)
Calcium: 8.3 mg/dL — ABNORMAL LOW (ref 8.4–10.5)
Calcium: 8.6 mg/dL (ref 8.4–10.5)
Chloride: 97 mEq/L (ref 96–112)
Chloride: 98 mEq/L (ref 96–112)
Creatinine, Ser: 1.43 mg/dL — ABNORMAL HIGH (ref 0.4–1.2)
Glucose, Bld: 107 mg/dL — ABNORMAL HIGH (ref 70–99)
Glucose, Bld: 112 mg/dL — ABNORMAL HIGH (ref 70–99)
Sodium: 134 mEq/L — ABNORMAL LOW (ref 135–145)

## 2010-05-21 LAB — RAPID URINE DRUG SCREEN, HOSP PERFORMED
Amphetamines: NOT DETECTED
Barbiturates: NOT DETECTED
Benzodiazepines: NOT DETECTED
Cocaine: NOT DETECTED
Opiates: NOT DETECTED

## 2010-05-21 LAB — URIC ACID
Uric Acid, Serum: 5.8 mg/dL (ref 2.4–7.0)
Uric Acid, Serum: 6.3 mg/dL (ref 2.4–7.0)

## 2010-05-21 LAB — CARDIAC PANEL(CRET KIN+CKTOT+MB+TROPI)
CK, MB: 1 ng/mL (ref 0.3–4.0)
CK, MB: 1 ng/mL (ref 0.3–4.0)
Relative Index: 0.7 (ref 0.0–2.5)
Troponin I: 0.01 ng/mL (ref 0.00–0.06)

## 2010-05-21 LAB — LIPID PANEL
LDL Cholesterol: 105 mg/dL — ABNORMAL HIGH (ref 0–99)
Total CHOL/HDL Ratio: 4.8 RATIO
Triglycerides: 60 mg/dL (ref <150)
VLDL: 12 mg/dL (ref 0–40)

## 2010-05-21 LAB — HEPATIC FUNCTION PANEL
ALT: 12 U/L (ref 0–35)
AST: 20 U/L (ref 0–37)
Albumin: 3.1 g/dL — ABNORMAL LOW (ref 3.5–5.2)
Alkaline Phosphatase: 105 U/L (ref 39–117)
Total Protein: 7.7 g/dL (ref 6.0–8.3)

## 2010-05-21 LAB — DIFFERENTIAL
Basophils Absolute: 0 10*3/uL (ref 0.0–0.1)
Lymphocytes Relative: 31 % (ref 12–46)
Monocytes Absolute: 0.3 10*3/uL (ref 0.1–1.0)
Neutro Abs: 3.5 10*3/uL (ref 1.7–7.7)

## 2010-05-24 NOTE — Discharge Summary (Signed)
Vanessa Palmer, Vanessa Palmer NO.:  [id number]  MEDICAL RECORD NO.:  [id number]           PATIENT TYPE:  LOCATION:                                 FACILITY:  PHYSICIAN:  [name], MD  DATE OF BIRTH:  04-16-1950  DATE OF ADMISSION:  04/23/2010 DATE OF DISCHARGE:  04/24/2010                              DISCHARGE SUMMARY   ATTENDING PHYSICIAN:  [name], MD.  DISCHARGE DIAGNOSES: 1. Shortness of breath secondary to asthma exacerbation. 2. Hypertension. 3. Depression and anxiety. 4. Gout. 5. Coronary artery disease. 6. Chronic kidney disease.  DISCHARGE MEDICATIONS: 1. Celexa 40 mg p.o. daily 2. Flovent HFA 1 puff inhaled b.i.d. 3. Carvedilol 25 mg 1 tablet by mouth b.i.d. 4. Aspirin and 325 mg 1 tablet by mouth p.o. daily. 5. Tramadol 50 mg 1 tablet by mouth q.i.d. p.r.n. for pain. 6. Simvastatin 40 mg p.o. at bedtime. 7. Albuterol inhaler 2 puffs inhaled q.6 h. P.r.n. 8. Omeprazole 20 mg 2 tablets b.i.d. 9. Norvasc 10 mg p.o. daily. 10.Gabapentin 300 mg p.o. t.i.d. 11.Ambien 5 mg p.o. at bedtime.  DISPOSITION AND FOLLOWUP:  Vanessa Palmer was discharged from [facility] on April 24, 2010, in stable and improved condition.  She has no fevers, no shortness of breath, nausea, and vomiting.  She needs to continue to take Vanessa albuterol and Flovent for Vanessa asthma.  She will need to follow up in 2-3 days with Vanessa primary care physician.  At that time, she will need to check Vanessa BMP for elevated creatinine, and also check on Vanessa depression as we stopped Vanessa Zoloft during this admission.  CONSULTATION:  None.  PROCEDURE PERFORMED:  Chest x-ray on April 22, 2010, shows no acute abnormality. Chest x-ray on April 23, 2010, shows no active disease.  ADMISSION HISTORY:  Vanessa Palmer is a 60 year old woman with a history of asthma (PFTs SHOW obstructive pattern, anxiety disorder, CAD, hypertension, morbid obesity who was transferred  from [facility] ED for asthma attack.  The patient states that earlier on the day of admission, she became short of breath and could not find Vanessa inhalers, so panic and went to the ED.  She states that she uses Vanessa rescue inhaler almost every day, although questionable as the patient does not seem to be knowledgeable about meds she is supposed to be taking.  She has also had a dry cough; however, she denies any chest pain, fever, chills, palpitation, night sweats, abdominal pain, nausea and vomiting, change in bowel habits, dysuria, hematuria, weakness, or numbness.  She states that she is currently under a lot of emotional stress as the day of admission marks the anniversary of Vanessa Palmer's death.  She had visited the graveside prior to Vanessa having the asthma attack.  At [facility] ED, she was given Solu-Medrol 125 mg and was stable after getting albuterol en route to ED.  ADMISSION PHYSICAL EXAMINATION:  VITAL SIGNS:  Temperature 98.3, pulse 67, blood pressure 188/105, respiratory rate 23, O2 sat 93% on 2 L nasal cannula. GENERAL:  Alert, well developed, cooperative to examination. HEAD:  Normocephalic, atraumatic. EYES:  Vision grossly intact.  Pupils equal, round, reactive to light. No injection, anicteric. MOUTH:  Pharynx pink, moist.  No erythema.  No exudate. NECK:  Supple.  Full range of motion.  No thyromegaly, no JVD, no carotid bruits. LUNGS:  Clear to auscultation.  No accessory muscle use, expiratory wheezing. CARDIOVASCULAR:  Regular rate and rhythm.  No murmur, gallop, or rub. ABDOMEN:  Soft, nontender, and nondistended.  Positive bowel sounds.  No hepatosplenomegaly. MUSCULOSKELETAL:  Full range of motion in all extremities proximally and distally bilaterally.  No joint edema or increased warmth to touch bilaterally. NEUROLOGIC:  Alert and oriented x3.  Cranial nerves II through XII grossly intact, strength normal in all extremities, sensation intact to light  touch and normal gait. SKIN:  Turgor normal.  No rashes. PSYCH:  Alert and oriented x3, good eye contact, not anxious appearing, not depressing appearing.  ADMISSION LABORATORY DATA:  WBC 7.2, hemoglobin is 11.7, hematocrit 36.4, platelets 220, neutrophil 88%, ANC 6.3.  I-STAT ionized calcium 1.11, bicarb 24.  Hemoglobin 12.9, hematocrit 38.  Sodium 140, potassium 4.2, chloride 107, glucose 146, BUN 46, creatinine 3.1.  HOSPITAL COURSE: 1. Shortness of breath likely secondary to asthma exacerbation in the     setting of not using Vanessa inhaler versus anxiety attack during Vanessa     recent psychosocial stressor.  The patient improved after albuterol     en route in the ED and given Solu-Medrol at [facility] in the ID.     Chest x-ray was not concerning volume overload, and there is no     infiltrate to suggest the present infection.  The patient was     afebrile and has no white count on WBC.  Electrolytes were within     normal limits except for creatinine of 3.1.  The patient received     albuterol and Atrovent nebulizer treatments and inhalers.  We also     restart Vanessa QVAR as Vanessa home medication.  The patient received O2     supplementation via nasal cannula and O2 sat were kept above 92%. 2. Hypertension.  We will continue Coreg and amlodipine. 3. Depression and anxiety.  We continue Celexa and discontinued the     sertraline.  Also continue Vanessa Xanax. 4. Gout stable.  We will continue Vanessa allopurinol. 5. CAD.  Continue aspirin, Coreg, and Zocor. 6. CKD.  Creatinine was 3.1 and Vanessa baseline was 1.8.  Recent CT of     abdomen and pelvis was unremarkable.  Vanessa elevated creatinine most     likely from prerenal from dehydration; therefore, we held Vanessa     Lasix.  The patient was hydrated with normal saline bolus, and a     repeat BMET shows improved creatinine to 2.15.  The patient will be     discharged home and have a close follow up with Vanessa PCP to repeat a     BMET.  She was also  encouraged to increase Vanessa fluid intake.  Vanessa     urine sodium was 97 and urine creatinine was 56.8.  Vanessa FENa was     2.68, which could also be consistent with ATN. 7. For DVT prophylaxis, the patient received Lovenox.  DISCHARGE VITAL SIGNS:  Temperature 97.9, blood pressure 141/88, respirations 20, pulse 57, oxygen saturation 99% on room air.  DISCHARGE LABORATORY DATA:  Sodium 137, potassium 4.3, chloride 101, bicarb 26, glucose 125, BUN 36, creatinine 2.15, glucose 125, calcium 8.3, GFR 24.  ______________________________ Carrolyn Meiers, MD   ______________________________ [name], MD    MH/MEDQ  D:  04/28/2010  T:  04/29/2010  Job:  [id number]  Electronically Signed by Carrolyn Meiers MD on 05/04/2010 05:44:07 PM Electronically Signed by Paulette Blanch DAM MD on 05/24/2010 12:27:40 PM

## 2010-06-01 LAB — CBC
HCT: 34.5 % — ABNORMAL LOW (ref 36.0–46.0)
HCT: 37.6 % (ref 36.0–46.0)
HCT: 37.9 % (ref 36.0–46.0)
Hemoglobin: 11.6 g/dL — ABNORMAL LOW (ref 12.0–15.0)
MCHC: 33.5 g/dL (ref 30.0–36.0)
MCHC: 33.7 g/dL (ref 30.0–36.0)
MCV: 91.1 fL (ref 78.0–100.0)
MCV: 91.2 fL (ref 78.0–100.0)
Platelets: 203 10*3/uL (ref 150–400)
Platelets: 222 10*3/uL (ref 150–400)
RBC: 3.78 MIL/uL — ABNORMAL LOW (ref 3.87–5.11)
RBC: 4.13 MIL/uL (ref 3.87–5.11)
RDW: 15.2 % (ref 11.5–15.5)
WBC: 5.3 10*3/uL (ref 4.0–10.5)
WBC: 5.8 10*3/uL (ref 4.0–10.5)

## 2010-06-01 LAB — URINALYSIS, ROUTINE W REFLEX MICROSCOPIC
Glucose, UA: NEGATIVE mg/dL
Hgb urine dipstick: NEGATIVE
Protein, ur: NEGATIVE mg/dL
Specific Gravity, Urine: 1.012 (ref 1.005–1.030)
pH: 5.5 (ref 5.0–8.0)

## 2010-06-01 LAB — BASIC METABOLIC PANEL
BUN: 12 mg/dL (ref 6–23)
CO2: 28 mEq/L (ref 19–32)
Calcium: 8.3 mg/dL — ABNORMAL LOW (ref 8.4–10.5)
Calcium: 9.1 mg/dL (ref 8.4–10.5)
Creatinine, Ser: 1.12 mg/dL (ref 0.4–1.2)
GFR calc Af Amer: 57 mL/min — ABNORMAL LOW (ref >60)
GFR calc non Af Amer: 51 mL/min — ABNORMAL LOW (ref >60)
Glucose, Bld: 113 mg/dL — ABNORMAL HIGH (ref 70–99)
Potassium: 3.7 mEq/L (ref 3.5–5.1)
Potassium: 4 mEq/L (ref 3.5–5.1)
Sodium: 138 mEq/L (ref 135–145)

## 2010-06-01 LAB — DIFFERENTIAL
Basophils Relative: 1 % (ref 0–1)
Eosinophils Absolute: 0.1 10*3/uL (ref 0.0–0.7)
Eosinophils Relative: 2 % (ref 0–5)
Lymphs Abs: 1.4 10*3/uL (ref 0.7–4.0)
Monocytes Relative: 6 % (ref 3–12)
Neutrophils Relative %: 68 % (ref 43–77)

## 2010-06-01 LAB — CARDIAC PANEL(CRET KIN+CKTOT+MB+TROPI)
CK, MB: 1.7 ng/mL (ref 0.3–4.0)
Relative Index: 1.2 (ref 0.0–2.5)
Total CK: 143 U/L (ref 7–177)

## 2010-06-01 LAB — LIPID PANEL
Cholesterol: 126 mg/dL (ref 0–200)
HDL: 31 mg/dL — ABNORMAL LOW (ref >39)
LDL Cholesterol: 85 mg/dL (ref 0–99)
Total CHOL/HDL Ratio: 4.1 RATIO
Triglycerides: 49 mg/dL (ref <150)

## 2010-06-01 LAB — POCT I-STAT, CHEM 8
BUN: 22 mg/dL (ref 6–23)
Calcium, Ion: 1.13 mmol/L (ref 1.12–1.32)
Creatinine, Ser: 1 mg/dL (ref 0.4–1.2)
Hemoglobin: 13.6 g/dL (ref 12.0–15.0)
TCO2: 27 mmol/L (ref 0–100)

## 2010-06-01 LAB — GLUCOSE, CAPILLARY: Glucose-Capillary: 126 mg/dL — ABNORMAL HIGH (ref 70–99)

## 2010-06-01 LAB — POCT CARDIAC MARKERS: Myoglobin, poc: 174 ng/mL (ref 12–200)

## 2010-06-01 LAB — RAPID URINE DRUG SCREEN, HOSP PERFORMED
Amphetamines: NOT DETECTED
Barbiturates: NOT DETECTED
Benzodiazepines: NOT DETECTED
Cocaine: NOT DETECTED
Opiates: POSITIVE — AB

## 2010-06-05 LAB — CBC
HCT: 32.3 % — ABNORMAL LOW (ref 36.0–46.0)
HCT: 34.5 % — ABNORMAL LOW (ref 36.0–46.0)
Hemoglobin: 11.1 g/dL — ABNORMAL LOW (ref 12.0–15.0)
Hemoglobin: 11.1 g/dL — ABNORMAL LOW (ref 12.0–15.0)
Hemoglobin: 11.7 g/dL — ABNORMAL LOW (ref 12.0–15.0)
Hemoglobin: 12 g/dL (ref 12.0–15.0)
MCHC: 34 g/dL (ref 30.0–36.0)
MCHC: 34.2 g/dL (ref 30.0–36.0)
MCV: 88.3 fL (ref 78.0–100.0)
RBC: 3.63 MIL/uL — ABNORMAL LOW (ref 3.87–5.11)
RBC: 3.66 MIL/uL — ABNORMAL LOW (ref 3.87–5.11)
RDW: 14.1 % (ref 11.5–15.5)
RDW: 14.1 % (ref 11.5–15.5)
WBC: 4.7 10*3/uL (ref 4.0–10.5)
WBC: 5.3 10*3/uL (ref 4.0–10.5)
WBC: 5.3 10*3/uL (ref 4.0–10.5)

## 2010-06-05 LAB — LIPID PANEL
HDL: 29 mg/dL — ABNORMAL LOW (ref >39)
LDL Cholesterol: 64 mg/dL (ref 0–99)
Total CHOL/HDL Ratio: 3.7 RATIO
Triglycerides: 64 mg/dL (ref <150)
VLDL: 13 mg/dL (ref 0–40)

## 2010-06-05 LAB — IRON AND TIBC
Saturation Ratios: 31 % (ref 20–55)
UIBC: 137 ug/dL

## 2010-06-05 LAB — BASIC METABOLIC PANEL
BUN: 11 mg/dL (ref 6–23)
CO2: 29 mEq/L (ref 19–32)
CO2: 29 mEq/L (ref 19–32)
Calcium: 8.3 mg/dL — ABNORMAL LOW (ref 8.4–10.5)
Chloride: 103 mEq/L (ref 96–112)
GFR calc Af Amer: 55 mL/min — ABNORMAL LOW (ref >60)
GFR calc non Af Amer: 46 mL/min — ABNORMAL LOW (ref >60)
Glucose, Bld: 114 mg/dL — ABNORMAL HIGH (ref 70–99)
Glucose, Bld: 132 mg/dL — ABNORMAL HIGH (ref 70–99)
Potassium: 3.7 mEq/L (ref 3.5–5.1)
Potassium: 3.8 mEq/L (ref 3.5–5.1)
Sodium: 135 mEq/L (ref 135–145)
Sodium: 137 mEq/L (ref 135–145)
Sodium: 139 mEq/L (ref 135–145)

## 2010-06-05 LAB — TROPONIN I: Troponin I: 0.01 ng/mL (ref 0.00–0.06)

## 2010-06-05 LAB — CARDIAC PANEL(CRET KIN+CKTOT+MB+TROPI)
CK, MB: 0.9 ng/mL (ref 0.3–4.0)
Relative Index: INVALID (ref 0.0–2.5)

## 2010-06-05 LAB — COMPREHENSIVE METABOLIC PANEL
ALT: 14 U/L (ref 0–35)
Albumin: 3.2 g/dL — ABNORMAL LOW (ref 3.5–5.2)
Alkaline Phosphatase: 151 U/L — ABNORMAL HIGH (ref 39–117)
Chloride: 104 mEq/L (ref 96–112)
Glucose, Bld: 124 mg/dL — ABNORMAL HIGH (ref 70–99)
Potassium: 3.6 mEq/L (ref 3.5–5.1)
Sodium: 141 mEq/L (ref 135–145)
Total Protein: 7.2 g/dL (ref 6.0–8.3)

## 2010-06-05 LAB — DIFFERENTIAL
Basophils Relative: 0 % (ref 0–1)
Eosinophils Absolute: 0.1 10*3/uL (ref 0.0–0.7)
Monocytes Absolute: 0.3 10*3/uL (ref 0.1–1.0)
Monocytes Relative: 6 % (ref 3–12)
Neutrophils Relative %: 58 % (ref 43–77)

## 2010-06-05 LAB — PROTIME-INR
INR: 1 (ref 0.00–1.49)
Prothrombin Time: 13.6 seconds (ref 11.6–15.2)

## 2010-06-05 LAB — VITAMIN B12: Vitamin B-12: 212 pg/mL (ref 211–911)

## 2010-06-05 LAB — CK TOTAL AND CKMB (NOT AT ARMC): Relative Index: 0.6 (ref 0.0–2.5)

## 2010-06-05 LAB — FERRITIN: Ferritin: 92 ng/mL (ref 10–291)

## 2010-06-05 LAB — TSH: TSH: 1.369 u[IU]/mL (ref 0.350–4.500)

## 2010-06-05 LAB — RETICULOCYTES
RBC.: 3.73 MIL/uL — ABNORMAL LOW (ref 3.87–5.11)
Retic Ct Pct: 1.1 % (ref 0.4–3.1)

## 2010-06-05 LAB — FOLATE: Folate: 7.6 ng/mL

## 2010-06-07 LAB — MAGNESIUM: Magnesium: 2.1 mg/dL (ref 1.5–2.5)

## 2010-06-07 LAB — CBC
HCT: 31.9 % — ABNORMAL LOW (ref 36.0–46.0)
Hemoglobin: 12.4 g/dL (ref 12.0–15.0)
Hemoglobin: 12.4 g/dL (ref 12.0–15.0)
Hemoglobin: 13.4 g/dL (ref 12.0–15.0)
MCHC: 29.3 g/dL — ABNORMAL LOW (ref 30.0–36.0)
MCHC: 34.5 g/dL (ref 30.0–36.0)
MCHC: 34.5 g/dL (ref 30.0–36.0)
MCV: 89 fL (ref 78.0–100.0)
MCV: 89.4 fL (ref 78.0–100.0)
MCV: 90.1 fL (ref 78.0–100.0)
MCV: 90.3 fL (ref 78.0–100.0)
Platelets: 223 10*3/uL (ref 150–400)
RBC: 3.57 MIL/uL — ABNORMAL LOW (ref 3.87–5.11)
RBC: 3.99 MIL/uL (ref 3.87–5.11)
RBC: 4.06 MIL/uL (ref 3.87–5.11)
RBC: 4.3 MIL/uL (ref 3.87–5.11)
RBC: 4.37 MIL/uL (ref 3.87–5.11)
RDW: 14.7 % (ref 11.5–15.5)
RDW: 14.9 % (ref 11.5–15.5)
WBC: 4.9 10*3/uL (ref 4.0–10.5)
WBC: 5.5 10*3/uL (ref 4.0–10.5)
WBC: 7.3 10*3/uL (ref 4.0–10.5)
WBC: 7.7 10*3/uL (ref 4.0–10.5)

## 2010-06-07 LAB — POCT CARDIAC MARKERS
Myoglobin, poc: 149 ng/mL (ref 12–200)
Troponin i, poc: <0.05 ng/mL (ref 0.00–0.09)

## 2010-06-07 LAB — COMPREHENSIVE METABOLIC PANEL
ALT: 14 U/L (ref 0–35)
Alkaline Phosphatase: 108 U/L (ref 39–117)
Chloride: 106 mEq/L (ref 96–112)
Glucose, Bld: 117 mg/dL — ABNORMAL HIGH (ref 70–99)
Potassium: 4.1 mEq/L (ref 3.5–5.1)
Sodium: 139 mEq/L (ref 135–145)
Total Protein: 8.5 g/dL — ABNORMAL HIGH (ref 6.0–8.3)

## 2010-06-07 LAB — POCT I-STAT, CHEM 8
BUN: 23 mg/dL (ref 6–23)
Calcium, Ion: 1.06 mmol/L — ABNORMAL LOW (ref 1.12–1.32)
Chloride: 103 mEq/L (ref 96–112)
Creatinine, Ser: 1.9 mg/dL — ABNORMAL HIGH (ref 0.4–1.2)
TCO2: 25 mmol/L (ref 0–100)

## 2010-06-07 LAB — RAPID URINE DRUG SCREEN, HOSP PERFORMED
Amphetamines: NOT DETECTED
Amphetamines: NOT DETECTED
Barbiturates: NOT DETECTED
Benzodiazepines: NOT DETECTED
Cocaine: NOT DETECTED
Opiates: POSITIVE — AB
Tetrahydrocannabinol: NOT DETECTED

## 2010-06-07 LAB — URINALYSIS, ROUTINE W REFLEX MICROSCOPIC
Bilirubin Urine: NEGATIVE
Nitrite: NEGATIVE
Specific Gravity, Urine: 1.012 (ref 1.005–1.030)
Urobilinogen, UA: 4 mg/dL — ABNORMAL HIGH (ref 0.0–1.0)
pH: 7 (ref 5.0–8.0)

## 2010-06-07 LAB — BASIC METABOLIC PANEL
BUN: 20 mg/dL (ref 6–23)
Chloride: 103 mEq/L (ref 96–112)
Creatinine, Ser: 1.5 mg/dL — ABNORMAL HIGH (ref 0.4–1.2)
GFR calc Af Amer: 40 mL/min — ABNORMAL LOW (ref >60)
GFR calc non Af Amer: 36 mL/min — ABNORMAL LOW (ref >60)
Potassium: 3.7 mEq/L (ref 3.5–5.1)
Potassium: 4.1 mEq/L (ref 3.5–5.1)
Sodium: 138 mEq/L (ref 135–145)

## 2010-06-07 LAB — APTT: aPTT: 38 seconds — ABNORMAL HIGH (ref 24–37)

## 2010-06-07 LAB — HEPATIC FUNCTION PANEL
ALT: 14 U/L (ref 0–35)
Alkaline Phosphatase: 128 U/L — ABNORMAL HIGH (ref 39–117)
Bilirubin, Direct: <0.1 mg/dL (ref 0.0–0.3)
Total Protein: 7.7 g/dL (ref 6.0–8.3)

## 2010-06-07 LAB — CULTURE, BLOOD (ROUTINE X 2): Culture: NO GROWTH

## 2010-06-07 LAB — CARDIAC PANEL(CRET KIN+CKTOT+MB+TROPI)
Relative Index: 0.7 (ref 0.0–2.5)
Total CK: 112 U/L (ref 7–177)
Total CK: 122 U/L (ref 7–177)
Troponin I: 0.02 ng/mL (ref 0.00–0.06)
Troponin I: <0.01 ng/mL (ref 0.00–0.06)

## 2010-06-07 LAB — DIFFERENTIAL
Basophils Absolute: 0 10*3/uL (ref 0.0–0.1)
Basophils Relative: 0 % (ref 0–1)
Basophils Relative: 1 % (ref 0–1)
Eosinophils Absolute: 0.1 10*3/uL (ref 0.0–0.7)
Lymphocytes Relative: 16 % (ref 12–46)
Lymphs Abs: 1.2 10*3/uL (ref 0.7–4.0)
Monocytes Relative: 6 % (ref 3–12)
Neutro Abs: 5.8 10*3/uL (ref 1.7–7.7)
Neutro Abs: 5.8 10*3/uL (ref 1.7–7.7)
Neutrophils Relative %: 76 % (ref 43–77)
Neutrophils Relative %: 80 % — ABNORMAL HIGH (ref 43–77)

## 2010-06-07 LAB — CULTURE, BLOOD (SINGLE): Culture: NO GROWTH

## 2010-06-07 LAB — D-DIMER, QUANTITATIVE: D-Dimer, Quant: 0.44 ug/mL-FEU (ref 0.00–0.48)

## 2010-06-07 LAB — LIPASE, BLOOD: Lipase: 21 U/L (ref 11–59)

## 2010-06-07 LAB — HEMOCCULT GUIAC POC 1CARD (OFFICE): Fecal Occult Bld: POSITIVE

## 2010-06-30 ENCOUNTER — Emergency Department (HOSPITAL_COMMUNITY)
Admission: EM | Admit: 2010-06-30 | Discharge: 2010-06-30 | Disposition: A | Discharge status: Home / Self Care | Payer: Medicaid Other | Attending: Emergency Medicine | Admitting: Emergency Medicine

## 2010-06-30 DIAGNOSIS — X30XXXA Exposure to excessive natural heat, initial encounter: Secondary | ICD-10-CM | POA: Insufficient documentation

## 2010-06-30 DIAGNOSIS — R112 Nausea with vomiting, unspecified: Secondary | ICD-10-CM | POA: Insufficient documentation

## 2010-06-30 DIAGNOSIS — T675XXA Heat exhaustion, unspecified, initial encounter: Secondary | ICD-10-CM | POA: Insufficient documentation

## 2010-06-30 DIAGNOSIS — I252 Old myocardial infarction: Secondary | ICD-10-CM | POA: Insufficient documentation

## 2010-06-30 DIAGNOSIS — I2510 Atherosclerotic heart disease of native coronary artery without angina pectoris: Secondary | ICD-10-CM | POA: Insufficient documentation

## 2010-06-30 DIAGNOSIS — I10 Essential (primary) hypertension: Secondary | ICD-10-CM | POA: Insufficient documentation

## 2010-06-30 DIAGNOSIS — N289 Disorder of kidney and ureter, unspecified: Secondary | ICD-10-CM | POA: Insufficient documentation

## 2010-06-30 LAB — POCT I-STAT, CHEM 8
BUN: 26 mg/dL — ABNORMAL HIGH (ref 6–23)
Calcium, Ion: 1.09 mmol/L — ABNORMAL LOW (ref 1.12–1.32)
Chloride: 108 mEq/L (ref 96–112)
Glucose, Bld: 103 mg/dL — ABNORMAL HIGH (ref 70–99)

## 2010-07-11 NOTE — Discharge Summary (Signed)
Vanessa Palmer, [name]               ACCOUNT NO.:  [id number]   MEDICAL RECORD NO.:  [id number]          PATIENT TYPE:  OBV   LOCATION:  4729                         FACILITY:  [facility]   PHYSICIAN:  C. Ulyess Mort, M.D.DATE OF BIRTH:  01-Apr-1950   DATE OF ADMISSION:  06/11/2008  DATE OF DISCHARGE:  06/12/2008                               DISCHARGE SUMMARY   DISCHARGE DIAGNOSES:  1. Atypical chest pain, followed by an alcoholic binge,      likelysecondary to gastroesophageal reflux disease versus      gastritis, negative ECG and Cardiac enzymes.  2. Hypertension.  3. Hyperlipidemia.  4. Obesity.  5. Alcohol abuse.  6. Coronary artery disease, H/O non-Q wave myocardial infarctions in      2004 and 2005, cath done in September 2006 with 10-15% left      circumflex, right coronary artery disease.  Status post Myoview in      May 2008, no stress-induced ischemia.  7. Obstructive sleep apnea, mild-to-moderate per sleep study in 2007.  8. Anxiety.  9. Chronic knee pain.   DISCHARGE MEDICATIONS:  1. Furosemide 40 mg 1 pill twice daily.  2. Vicodin 5 mg/500 mg 1 pill 4 times daily as needed for pain.  3. Carvedilol 25 mg 1 pill twice daily.  4. Norvasc 5 mg 1 pill once daily.  5. Lipitor 40 mg 1 pill once daily.  6. Protonix 20 mg 1 pill once daily, 30 minutes before breakfast.  7. Tramadol 50 mg 1 pill every 6 hours as needed for pain.  8. Aspirin 325 mg 1 pill once daily.   DISPOSITION AND FOLLOWUP:  The patient is to follow up with Outpatient Clinic [facility]  in 3-4 weeks.  The Outpatient Clinic will call the patient regarding the  date of appointment.   PROCEDURES PERFORMED:  EKG, no acute ischemic changes in the EKG.  Cardiac enzymes, point-of-care markers negative.  Cardiac enzymes x2 are  also negative for myocardial injury.  Chest x-ray, no evidence of acute  cardiopulmonary disease.   CONSULTATIONS:  None.   ADMITTING HISTORY AND PHYSICAL:  The patient is  a 60 year old lady who comes to the emergency with  complaints of 10/10 chest pain substernal, stabbing.  Chest pain  occurred during a drinking binge.  She is intoxicated during the  interview and is unable to provide a complete history.  She is currently  complaining of a dry mouth, dizziness, but the chest pain as resolved.  There is a strong odor of ethyl alcohol on breath.  In the ED, her  cardiac enzymes were normal and her EKG showed flattening of the T-waves  in the latteral leads, but no ST elevation or specific signs of  ischemia.   ALLERGIES:  The patient is allergic to ACE INHIBITORS and DARVOCET.   PHYSICAL EXAMINATION:  VITAL SIGNS:  Temperature 98.6, blood pressure 136/85, pulse rate of 79,  regular in rate and rhythm, respiratory rate 16, and oxygen saturation  95% on room air.  GENERAL:  The patient was obese and disheveled.  HEENT:  Head examination, normocephalic without  obvious abnormalities.  Eyes, pupils equal, round and reacting to light.  ENT, poor dentition.  NECK:  Supple.  No masses.  Trachea is midline.  RESPIRATORY:  No intercostal retractions or accessory muscle use.  No  wheezes.  No rhonchi.  No rales.  CHEST:  Chest wall is nontender.  CARDIOVASCULAR:  Normal S1 and S2.  No murmurs.  No rubs.  No gallops.  GU:  Abdomen is soft and nontender with normal bowel sounds.  MUSCULOSKELETAL:  Trace pedal edema in the lower extremities, symmetric.  PSYCHIATRIC:  The patient is intoxicated.   LABORATORY DATA:  D-dimer is 0.44, alcohol level 134, serum lipase is  21, urinalysis is negative for nitrites and leukocytes.  Urine drug  screen is positive for opioids.   HOSPITAL COURSE BY PROBLEM:  1. Chest pain, most likely secondary to gastroesophageal reflux      disease or acute gastritis secondary to alcohol binge.  Given the      patient's history of coronary artery disease and risk factors      including hypertension, obesity, and hyperlipidemia, we  admitted      the patient to telemetry bed to rule out the possibility of acute      coronary syndrome.  The patient's EKG in the emergency did not show      any acute ischemic changes.  Her point-of-care markers and cardiac      enzymes x2 are negative.  The patient's chest x-ray also did not      reveal any cardiopulmonary process that could explain her chest      pain.  We treated her symptomatically with the pain medications      morphine as well as Protonix and we continued her statins and blood      pressure medications.  The patient's pain gradually resolved during      the hospitalization and on the morning of April 17, the patient is      completely asymptomatic and denies any complaints.  The patient is      to follow up with Outpatient Clinic, [facility].   1. Alcohol intoxication.  The patient's blood alcohol level during      admission was 134 and at the time of examination, the patient was      intoxicated and could not get much history from the patient.  By      the morning of April 17, the patient's intoxication gradually      resolved and was completely asymptomatic.  We put her on CIWA      protocol, but the patient did not have any symptoms of withdrawal.      Patient received counselling on alcohol cessation. The patient is      to follow up with the Outpatient Clinic [facility]  2. Hypertension.  The blood pressures are well controlled during this      hospitalization.  The patient is to continue her home medications.  3. Hyperlipidemia.  We continued her home statin.  The patient is to      continue the medication.   DISCHARGE VITALS:  Temperature 98, pulse rate of 83, respiratory rate 18, blood pressure  179/88, and oxygen saturation 92% on room  air.  At the time of discharge, the patient is alert and oriented,  stable and is not in any acute distress and is completely asymptomatic.  The patient is to take all the medications that were  mentioned in the  discharge  instructions and is to follow up with the Outpatient Clinic  [facility] in 3-4 weeks.      [name], MD  Electronically Signed      C. Ulyess Mort, M.D.  Electronically Signed    VB/MEDQ  D:  06/12/2008  T:  06/13/2008  Job:  [id number]   cc:   Waldemar Dickens, MD  Outpatient Clinic, [facility]

## 2010-07-11 NOTE — Discharge Summary (Signed)
NAMESOLIANA, Vanessa Palmer               ACCOUNT NO.:  [id number]   MEDICAL RECORD NO.:  [id number]          PATIENT TYPE:  INP   LOCATION:  3705                         FACILITY:  [facility]   PHYSICIAN:  Mohan N. Sharyn Lull, M.D. DATE OF BIRTH:  07-01-1950   DATE OF ADMISSION:  08/18/2008  DATE OF DISCHARGE:  08/20/2008                               DISCHARGE SUMMARY   ADMITTING DIAGNOSES:  1. Chest pain, rule out myocardial infarction.  2. Coronary artery disease.  3. History of non-Q-wave myocardial infarction in the past.  4. Hypertension.  5. Obstructive sleep apnea.  6. Obesity hypoventilation syndrome.  7. Morbid obesity.  8. Hypercholesteremia.  9. Gastroesophageal reflux disease.  10.History of gouty arthritis.   DISCHARGE HOME MEDICATIONS:  1. Enteric-coated aspirin 325 mg 1 tablet daily.  2. Coreg 25 mg 1 tablet every 12 hours.  3. Norvasc 5 mg 1 tablet daily.  4. Lipitor 40 mg p.o. daily.  5. Protonix 40 mg p.o. daily.  6. Lasix 40 mg twice daily.  7. Vicodin 5/500 every 6 hours as needed for pain.  8. Tramadol 50 mg every 6 hours as needed for pain.  9. Doxycycline 100 mg twice daily, to finish the complete dose.  10.Imdur 30 mg 1 tablet daily in the morning.  11.Allopurinol 300 mg 1 tablet daily.   DIET:  Low salt, low cholesterol 1800 calories ADA diet.  The patient  has been advised to avoid sweets.   ACTIVITY:  As tolerated.   FOLLOWUP:  With me in 1 week.   CONDITION AT DISCHARGE:  Stable.   BRIEF HISTORY AND HOSPITAL COURSE:  Vanessa Palmer is a 60 year old black  female with past medical history significant for coronary artery  disease, history of small non-Q-wave myocardial infarction, status post  left cath in the past noted to have small vessel CAD not suitable for  PCI, hypertension, hypercholesteremia, obstructive sleep apnea, obesity  hypoventilation syndrome, history of gouty arthritis, GERD, and morbid  obesity.  She came to the ER via EMS complaining of  retrosternal left-  sided chest pain, sharp, grade 10/10, associated with mild shortness of  breath.  Denies any nausea, vomiting, or diaphoresis.  Denies PND,  orthopnea, or leg swelling.  Denies palpitation, lightheadedness, or  syncope.  The patient was seen in the office for recurrent vague chest  pain few weeks ago and was scheduled for Persantine Myoview.  Denies any  cough, fever, or chills.  Denies relation of chest pain to food,  breathing, or movement.   PAST MEDICAL HISTORY:  As above.   PAST SURGICAL HISTORY:  She had a hysterectomy in the past and had tubal  ligation in the past.   MEDICATIONS:  1. Coreg 25 mg p.o. q.12 h.  2. Norvasc 5 mg p.o. daily.  3. Lasix 40 mg p.o. b.i.d.  4. Protonix 40 mg p.o. daily.  5. Lipitor 40 mg p.o. daily.  6. Vicodin 5/500 q.6 h. p.r.n.  7. Colchicine 0.6 mg daily.  8. Imdur 30 mg p.o. daily.   ALLERGIES:  She is allergic to ACE INHIBITORS.  She gets  angioedema.   SOCIAL HISTORY:  She is single, three children.  No history of smoking.  Used to drink hard liquor in the past off and on.  No history of drug  abuse.  Works for a [employer].   FAMILY HISTORY:  Positive for coronary artery disease.   PHYSICAL EXAMINATION:  GENERAL:  She was alert, awake, and oriented x3  in no acute distress.  VITAL SIGNS:  Blood pressure was 114/66, pulse was 64 regular.  HEENT:  Conjunctivae was pink.  NECK:  Supple, no JVD, and no bruit.  LUNGS:  Clear to auscultation without rhonchi or rales or wheezing.  CARDIOVASCULAR:  S1-S2.  There was soft systolic murmur.  There was no  S3 or gallop.  ABDOMEN:  Soft, bowel sounds were present, obese, and nontender.  EXTREMITIES:  There was no clubbing, cyanosis or edema.   LABORATORY DATA:  EKG showed normal sinus rhythm with nonspecific T-wave  changes.  Hemoglobin was 12, hematocrit 36.6, and white count of 4.7.  Sodium 141, potassium 3.6, glucose 124, BUN 11, and creatinine 1.18.  Uric acid level was 7.4.   Hemoglobin A1c was 5.8.  TSH was 1.36.  Two  sets of cardiac enzymes were normal.  Cholesterol was 106, HDL was 29,  LDL was 64, and triglycerides were 64.  Her anemia panel, her retic  count was 1.1.  Reticulocyte absolute count was 41 which was normal.  Iron was 62 which was normal.  Iron binding capacity was also low 199.  Saturation was 31.  B12 was 212 and folic acid level was 7.6.  Chest x-  ray showed no acute infiltrates or edema.  Degenerative changes in the  thoracic spine.  Myoview scan showed no evidence of reversible ischemia  and normal wall motion with EF of 69%.   BRIEF HOSPITAL COURSE:  The patient was admitted to Telemetry Unit.  MI  was ruled out by serial enzymes and EKG.  The patient did not have any  episodes of chest pain during the hospital stay.  The patient  subsequently  underwent Persantine Myoview which showed no evidence of ischemia or  infarction with an EF of 69%.  The patient has been ambulating in  hallway without any problems.  The patient will be discharged home on  the above medications and will be followed up in my office in 1 week.      Eduardo Osier. Sharyn Lull, M.D.  Electronically Signed     Eduardo Osier. Sharyn Lull, M.D.  Electronically Signed    MNH/MEDQ  D:  08/20/2008  T:  08/21/2008  Job:  [id number]

## 2010-07-11 NOTE — Discharge Summary (Signed)
NAMEZORIANNA, TALIAFERRO NO.:  [id number]   MEDICAL RECORD NO.:  [id number]          PATIENT TYPE:  INP   LOCATION:  5024                         FACILITY:  [facility]   PHYSICIAN:  Waldemar Dickens, MD     DATE OF BIRTH:  [birth year]   DATE OF ADMISSION:  06/21/2008  DATE OF DISCHARGE:  06/24/2008                               DISCHARGE SUMMARY   DISCHARGE DIAGNOSES:  1. Painful, erythematous right foot with superficial cellulitis,      possibly in the setting of the gout flare.  2. Questionable history of gout (the patient denies joint aspirate in      the past and I cannot find documentation of one.)  3. Anemia, fecal occult blood test positive, needs colonoscopy as an      outpatient.  4. Morbid obesity.  5. Hypertension.  6. Hyperlipidemia.  7. History of coronary artery disease with non-Q-wave myocardial      infarction in 2004 and 2005, Myoview May 2008:  Fixed defects at      the apex and inferior wall.  No stress-induced ischemia.  8. Sleep apnea, mild to moderate per sleep study, March 2007.  9. Anxiety.  10.Alcohol abuse with recent relapse, April 2010.  11.Gastroesophageal reflux disease.  12.Status post hysterectomy.  13.Chronic renal insufficiency with baseline creatinine 1.3-1.9.   DISCHARGE MEDICATIONS:  1. Furosemide 40 mg p.o. b.i.d.  2. Vicodin 5/500 p.o. q.i.d.  3. Carvedilol 25 mg p.o. b.i.d.  4. Aspirin 325 mg p.o. daily.  5. Norvasc 5 mg p.o. daily.  6. Lipitor 40 mg p.o. daily.  7. Protonix 20 mg p.o. daily 30 minutes before meal.  8. Tramadol 50 mg p.o. q.6 h. p.r.n. pain.  9. Doxycycline 100 mg p.o. b.i.d.  10.Colchicine 0.6 mg p.o. daily.   DISCHARGE CONDITION AND FOLLOWUP:  The patient was discharged in stable  condition, her swelling and pain in her foot having decreased slightly  during the hospitalization.  She was able to ambulate using her crutches  for distance of approximately 40 feet on the day of discharge and will  have  assistance from family members with her activities of daily living  at home until her foot improves.  She is to follow up with Dr. Janyth Pupa  in the [facility] on Jul 05, 2008 at 3:30 p.m..   ITEMS TO BE ADDRESSED DURING THIS VISIT:  1. Confirm resolution of the patient's right foot cellulitis.  2. Assess for any residual swelling and determine whether the patient      is able to return to work.  3. Given the patient's anemia and her positive fecal occult blood      test, she needs to be scheduled for a screening colonoscopy.   PROCEDURES:  1. Complete three-view foot x-rays of the right foot, dated June 21, 2008, demonstrated generalized soft tissue prominence, but no sign      of fracture or osteomyelitis.  There are mild degenerative changes      at the metatarsophalangeal joint, the great toe with mild hallux  valgus deformity.   CONSULTATIONS:  Wound care was consulted to address the drainage from  the patient's foot.   BRIEF ADMITTING HISTORY AND PHYSICAL:  For complete details, please see  the hospital chart, but in brief Ms. Gaspar is a 60 year old woman with  past medical history detailed above who presented with a painful swollen  right foot worsening over the past 2-3 weeks prior to admission.  It  began as painless blister around the lateral aspect of her proximal  small toe and appeared to be a type of blister that you would get from a  tight shoe, this burst approximately 2-3 weeks prior to admission and  yielded clear fluid.  The foot became progressively swollen, red, and  painful.  She had been using crutches to walk around.  Her job involves  lots of time on her feet and she has had difficulty performing her work.  She had tried peroxide and epsom salt without improvement.  Taking  Vicodin for pain.  No antibiotics.  She denies fevers or chills, but did  have sweats in the past couple nights prior to admission.  No nausea,  vomiting,  diarrhea, chest pain, or palpitations.  She has longstanding  dyspnea on exertion, which is stable.  No dysuria and no bleeding.  She  also does have a rash at the time of admission and she reported that she  had no idea for how long this had been present, it was found on her left  lower extremity and right upper extremity.  She denied any oral, genital  or rectal lesions.   PHYSICAL EXAMINATION:  VITAL SIGNS:  Temperature 98, pulse 71, blood  pressure 149/97, respirations 18, oxygen saturation 95% room air.  GENERAL:  The patient was in no acute distress.  EYES:  Pupils equal, round, reactive to light.  Extraocular motions  intact.  Sclerae muddy, but not inflamed.  ENT:  Mucous membranes moist.  No signs of mucosal inflammation.  NECK:  Supple.  No masses.  RESPIRATIONS:  Normal respiratory effort.  Good air movement.  Clear to  auscultation bilaterally.  HEART:  Regular rate and rhythm.  No murmurs.  No JVD.  GI:  Bowel sounds present.  ABDOMEN:  Obese, soft, mildly tender to deep palpation throughout.  Nondistended.  No rebound.  No guarding.  EXTREMITIES:  Right lower extremity foot with redness, swelling, and  tenderness, draining serosanguineous liquid from the proximal little  toe.  No fluctuance or evidence of drainable fluid collection.  Pulse is  not palpated, partially due to patient's discomfort.  Reasonable  capillary refill in the great toe.  Left lower extremity with a  maculopapular rash over the anterolateral foot and anterior distal shin  with some tense fluid-filled papules and other papules with evidence of  excoriation.  Right upper extremity demonstrated macules and excoriated  papules, some crusted with blood on the posterior upper arm.  NEUROLOGIC:  Alert and oriented x4.  Cranial nerves II through XII  intact.  Strength 5/5 throughout.  No focal deficits.   LABORATORY DATA:  ESR of 125.  White blood count 7.7, hemoglobin 12.4,  platelets 235.  Foot x-rays of  the right foot were obtained with the  findings described above and were essentially negative.   HOSPITAL COURSE:  1. Due to the concerning appearance of the foot for an aggressive      cellulitis, the patient was admitted and given IV antibiotics with      vancomycin and  Zosyn for broad-spectrum coverage.  Blood cultures      were obtained x2 and were ultimately negative.  Wound care was      consulted.  She was treated with Vicodin for pain.  Her foot did      become slightly less tender and slightly less swollen, although it      remained quite erythematous and swollen through the      hospitalization.  She was transitioned from IV antibiotics to p.o.      doxycycline and Augmentin.  The following day her foot again      appeared not much different from admission.  She continued to ooze      serosanguineous material from the lateral aspect of for little toe.      It was therefore decided to discharge her on a course of      doxycycline for a likely cellulitis at least involving the lateral      aspect of the foot where she had had the ruptured blister.  In      addition, she was given a prescription for colchicine to cover for      the possibility that the primary problem with her foot was swelling      due to gout.  Joint aspirate was not undertaken because of concern      for overlying cellulitis in addition to the fact that there did not      appear to be a joint space anywhere in the foot into which one      could easily place a needle.  The patient is to followup as      outlined above with Dr. Janyth Pupa in the [facility] to verify improvement of her foot.  She is to call if she      has any fevers, chills, or if her foot is getting worse or she has      other problems.  2. Alcohol abuse:  The patient was counseled on the importance of      abstinence from alcohol and the possible contribution of alcohol to      her gout.  She will need this reassessed at  her followup visits.  3. Anemia:  The patient had stool checked for occult blood due to her      anemia with borderline microcytosis and due to her age and her      positive FOBT, she will need to have a screening colonoscopy as an      outpatient.   DISCHARGE LABORATORY DATA:  VITAL SIGNS:  Temperature 97.1, pulse 71,  respirations 18, blood pressure 139/90, oxygen saturation 96% on room  air.  Sodium 138, potassium 3.7, chloride 103, bicarb 25, glucose 97,  BUN 20, creatinine 1.62, calcium 8.2, white blood count 4.9, hemoglobin  10.9, platelets 221.   PENDING LABS AS OF THE TIME OF THIS DICTATION:  There are blood  cultures, which are negative to date, but the final report has not yet  been issued.      Loel Dubonnet, MD  Electronically Signed      Waldemar Dickens, MD  Electronically Signed    PN/MEDQ  D:  06/25/2008  T:  06/26/2008  Job:  [id number]

## 2010-07-11 NOTE — Op Note (Signed)
[name], [name]               ACCOUNT NO.:  [id number]   MEDICAL RECORD NO.:  [id number]          PATIENT TYPE:  AMB   LOCATION:  SDS                          FACILITY:  [facility]   PHYSICIAN:  [name]. [name], M.D.DATE OF BIRTH:  Sep 01, 1950   DATE OF PROCEDURE:  08/12/2007  DATE OF DISCHARGE:  08/12/2007                               OPERATIVE REPORT   PREOPERATIVE DIAGNOSIS:  Left knee internal derangement, degenerative  joint disease, questionable meniscal tear.   POSTOPERATIVE DIAGNOSES:  1. Grade 3 to grade 4 chondromalacia, medial and lateral femoral      condyles.  2. Left knee medial and lateral meniscal tears.  3. Left knee effusion with synovitis.   PROCEDURE:  1. Left knee arthroscopy with debridement.  2. Left knee medial and lateral partial meniscectomies.  3. Chondroplasty, left knee medial femoral condyle.   SURGEON:  [name]. [name], MD   ANESTHESIA:  General.   ANTIBIOTICS:  IV Ancef 2 grams.   COMPLICATIONS:  None.   ESTIMATED BLOOD LOSS:  25 mL.   INDICATIONS:  Briefly, [name] is a 60 year old obese female with  left knee pain.  She had medial and lateral joint line tenderness and an  MRI was obtained by her primary care physician did show cartilage  thinning throughout the knee as well as medial and lateral meniscal  tears.  I talked to her about the fact that she had no locking fashion  and catching-like pain to try to temporize this with injections.  She  said she was quite afraid of needles and wanted to proceed with  arthroscopic surgery.  I detailed her this may only be a temporizing  affect to her knee and weight loss would be crucial for her.  She wished  to proceed with surgery.   DESCRIPTION OF PROCEDURE:  After informed consent was obtained and  appropriate left leg was marked, she was brought to the operating room  and placed supine on the operating table.  General anesthesia was then  obtained, the bed was raised,  and lateral leg post was utilized.  A time-  out was called and she was identified as the correct patient and  correction extremity.  The left leg was prepped and draped with DuraPrep  and sterile drapes.  An anterolateral portal was then made up to the  lateral edge of the patellar tendon at the level of inferior pole of  patella and arthroscope cannula was inserted.  There was a mild effusion  encountered in the knee and I then put the camera in the knee.  There  was significant synovitis throughout the knee and we could see that  there was posterior complex medial meniscal tear as well as a peripheral  midbody tear.  There was likewise a lateral meniscal tear.  An  arthroscopic shaver was inserted through an anterior medial portal that  was made and a significant debridement was carried out including  chondroplasty of the medial femoral condyle back to a stable margin.  I  used upcutting biters as well as the arthroscopic shaver to perform  partial  medial and lateral meniscectomies.  The ACL was found to be  intact and there was cartilage changes in the patella and the  patellofemoral joint as well.  I then allowed fluid to lavage the knee  and all instrumentation was removed.  I drained the fluid from the knee  and closed the portal sites with interrupted 3-0 nylon suture.  A  mixture of 4 mg of morphine and 0.25% Sensorcaine with epinephrine was  inserted into the knee joint as well as arthroscopy portal sites.  She  tolerated the surgery well.  Xeroform followed by well-padded sterile  dressing was placed around her knee.  She was awakened, extubated, and  taken to the recovery room in stable condition.  All final counts were  correct.  There were no complications noted.      [name]. [name], M.D.  Electronically Signed     CYB/MEDQ  D:  08/12/2007  T:  08/13/2007  Job:  [id number]

## 2010-07-11 NOTE — Discharge Summary (Signed)
NAMEFATINA, SPRANKLE NO.:  [id number]   MEDICAL RECORD NO.:  [id number]          PATIENT TYPE:  OBV   LOCATION:  6527                         FACILITY:  [facility]   PHYSICIAN:  Beatrix Fetters, M.D.     DATE OF BIRTH:  11-Feb-1951   DATE OF ADMISSION:  07/08/2006  DATE OF DISCHARGE:  07/11/2006                               DISCHARGE SUMMARY   CONTINUITY PHYSICIAN:  Dr. Manning Charity in the [facility]   DISCHARGE DIAGNOSES:  Include:  1. Chest pain, likely secondary to gastroesophageal reflux disease.  2. Hypertension.  3. Chronic renal insufficiency.  4. Obesity.  5. Hyperlipidemia.  6. Alcohol abuse.   DISCHARGE MEDICATIONS:  Include:  1. Norvasc 10 mg daily.  2. Benicar HCTZ 40/25 mg daily.  3. Atenolol 100 mg  daily.  4. Vytorin 10/20 mg daily.  5. ASA 81 mg daily.   FOLLOWUP:  Patient is to follow up with Dr. Lyda Perone at the [facility] on May 28 at 4:00 p.m.  At that time, we would  recommend that the primary care physician start the patient on a proton  pump inhibitor twice daily for her gastroesophageal reflux disease that  appears to be causing her chest pain.   PROCEDURES PERFORMED:  Included a Myoview study which showed no SI  ischemia and a fixed defect apex and inferior wall as well as an EF of  58%.   CONSULTATIONS:  Included Dr. Sharyn Lull of cardiology who ordered the  Myoview study and recommended medical management as an outpatient and  also who concluded that it was likely a GI source of her pain.   ADMITTING HISTORY AND PHYSICAL:  This is a 60 year old woman with a past  medical history of non-Q-wave myocardial infarction in 2004/2005,  hypertension, chronic renal insufficiency with a baseline of 1.2, OSA,  obesity and history of alcohol abuse presenting to the [facility] ED  complaining of chest pain that began on the morning seven days prior to  admission that is described as cramping pain that was  focused in the  epigastric area and radiated down the left arm and left leg.  The pain  has been continuous and has been a 10/10 associated with shortness of  breath, diaphoresis and palpitations, but no nausea or vomiting.  No  nitroglycerin was used.  Patient states that the pain is worse when  lying flat.  No history of major stressors and no overt reflux symptoms.   VITALS:  Temperature 97.4, blood pressure 154/99, respirations 26, O2  94% on room air.  GENERAL:  Patient was in no acute distress, pleasant, resting  comfortably.  HEENT:  EOMI.  PERL.  Oropharynx was clear.  Poor dentition.  LUNGS:  Clear to auscultation with distant breath sounds.  HEART:  Regular rate and rhythm without murmur.  No carotid bruits.  ABDOMEN:  Soft and nontender.  Obese with positive bowel sounds.  SKIN:  Warm and dry.  NEURO EXAM:  Intact.   LABS:  Sodium 137, potassium 3.9, chloride 104, bicarb 27, BUN 18,  creatinine 1.41, glucose 104.  White count 5.1, hemoglobin 12.9,  platelets 184.  Initial point-of-care markers had a troponin of less  than 0.05 and this level increased to 2.64.   HOSPITAL COURSE:  1. Chest pain.  This patient's chest pain was uncharacteristic of an      acute coronary syndrome, especially with a seven-day history of      symptoms and no major EKG changes, however her cardiac enzymes      showed a normal CK and MB, however a very elevated troponin at 2.64      then this trended downward to 2.50, 2.38 and 2.36.  Dr. Sharyn Lull was      consulted; he came to see the patient; he also did not believe that      her chest pain was cardiac in nature, however he ordered a Myoview      study with plans to cath the patient if the Myoview was positive;      the Myoview was negative and he subsequently recommended medical      management and we concluded that her chest pain was more likely to      be due to gastroesophageal reflux disease and we recommend that      proton pump inhibitor  be started on outpatient basis by primary      care Rozalyn Osland; we recommend b.i.d. dosing of this medication.  2. Hypertension.  We continued the patient on her home medications and      her blood pressure was stable during her hospital stay.  3. Chronic renal insufficiency.  The patient remained at her baseline      after hydration during the hospital stay.  4. Hyperlipidemia.  We continued the patient on Zocor during the      hospital stay and she was discharged back on her home regimen of      Vytorin.  5. Obesity.  We recommend that patient lose weight and she is to      follow up with her primary care Johnnie Moten regarding this problem.  6. Alcohol abuse.  This is an ongoing problem for the patient.  She      had stated that she had last drank alcohol one week prior to      admission and we do recommend cessation with her multiple medical      problems.   DISCHARGE LABORATORIES:  Include sodium 135, potassium 3.9, chloride  104, bicarb 27, glucose 102, BUN 15, creatinine 1.33.  White count 5.1,  hemoglobin 12.2, platelets 166.   DISCHARGE VITALS:  Include temperature 97.9, pulse 56, respirations 20,  blood pressure 101/53, O2 sats 94% on room air.      Beatrix Fetters, M.D.  Electronically Signed     CA/MEDQ  D:  07/11/2006  T:  07/11/2006  Job:  [id number]

## 2010-07-11 NOTE — Discharge Summary (Signed)
Vanessa Palmer NO.:  [id number]   MEDICAL RECORD NO.:  [id number]          PATIENT TYPE:  INP   LOCATION:  4704                         FACILITY:  [facility]   PHYSICIAN:  Ileana Roup, M.D.  DATE OF BIRTH:  09-Jan-1951   DATE OF ADMISSION:  02/09/2008  DATE OF DISCHARGE:  02/12/2008                               DISCHARGE SUMMARY   DISCHARGE DIAGNOSES:  1. Community-acquired pneumonia.  2. Acute renal failure.  3. Hypertension.  4. Hyperlipidemia.  5. Anxiety.  6. History of alcohol abuse.  7. History of medication noncompliance.  8. History of atypical chest pain.  9. Coronary artery disease.   DISCHARGE MEDICATIONS:  1. Avelox 400 mg p.o. daily.  The patient's last dose to be on      December 20, for a total duration of therapy of 7 days.  2. Tamiflu 75 mg p.o. b.i.d., last dose to be on the evening of      December 18, for a total treatment duration of 5 days.  3. Lasix 40 mg p.o. b.i.d.  4. Coreg 25 mg p.o. b.i.d.  5. Aspirin 325 mg p.o. daily.  6. Pravastatin 40 mg p.o. daily.  7. Zantac 150 mg p.o. b.i.d.  8. Norvasc 5 mg p.o. daily.   DISPOSITION AND FOLLOWUP:  The patient is to follow up in the [facility] with Dr. Hartley Barefoot on March 03, 2008, at 2:30  p.m.  At that time, she will need a BMET to assess both her creatinine  and potassium status.  In addition, please make sure that the patient  has to all of her medication.  She has a long history of medication  noncompliance.   PROCEDURES PERFORMED:  1. Chest x-ray on February 09, 2008, revealed stable cardiomegaly with      possible bronchitis with peribronchial thickening.  No definite      pneumonia.  2. CT angiogram of the chest on February 09, 2008, shows no pulmonary      embolus with also left lower lobe airspace disease suspicious for      pneumonia.   CONSULTATIONS:  None.   BRIEF ADMITTING HISTORY AND PHYSICAL:  The patient is a 60 year old lady  with past medical history of NSTEMI in 2003, obstructive sleep apnea,  uncontrolled hypertension, and hyperlipidemia who came to the emergency  room for cough and shortness of breath.  The patient stated these  symptoms started about 1 week prior to admission.  She states her cough  is productive with a greenish sputum.  Normally she can walk a few  blocks without any shortness of breath but now she states she is short  of breath at rest.  She also described some chest tightness that occurs  with her deep breathing and cough.  The patient states that this chest  tightness has no radiation.  The patient also endorses 2 episodes of  emesis prior to the day of admission.  She states that they were  nonbloody and nonbilious.  She also endorses subjective fever but denies  chills or night sweats.  She denies any recent sick contacts.   PHYSICAL EXAMINATION:  VITAL SIGNS:  Temperature 97.0, blood pressure  179/103, pulse 75, respirations 24, and saturating 100% on room air.  GENERAL:  The patient is in mild respiratory distress.  HEENT:  Eyes, pupils are equal, round, and reactive to light and  accommodation.  Anicteric.  ENT; oropharynx clear.  Moist mucous  membranes.  NECK:  No JVD.  RESPIRATORY:  Scattered wheezes throughout.  Good air movement.  CARDIOVASCULAR:  Regular rate and rhythm.  No murmurs, rubs, or gallops.  GI:  Obese abdomen, soft, nontender, and nondistended.  Positive bowel  sounds.  EXTREMITIES:  No pedal edema.  The patient endorses bilateral calf  tenderness.  SKIN:  No rashes or lesions.  LYMPHATICS:  No lymphadenopathy.  MUSCULOSKELETAL:  No joint abnormalities.  NEURO:  Alert and oriented x3.  Nonfocal neuro exam.  PSYCH:  Appropriate.   LABORATORY DATA:  Sodium 141, potassium 3.3, chloride 103, bicarb 25,  BUN 7, creatinine 1.0, and glucose 126.  White count 3.5, hemoglobin  13.1, platelets and 174.  BNP of 152.  First set of cardiac enzymes is  negative.  Strep  pneumo urinary antigen is positive.   HOSPITAL COURSE:  1. Community-acquired pneumonia.  The patient was admitted to the      [facility] for evaluation of her cough and      shortness of breath.  The patient was placed on IV Avelox and also      on Tamiflu secondary to her presenting symptoms.  The primary team      obtained a chest x-ray which showed no definite pneumonia, but      possible bronchitis with peribronchial thickening.  The patient had      a strep pneumo urinary antigen result that came back as positive.      The patient's Avelox was transitioned from IV to p.o. on her second      day of hospitalization.  She remained afebrile throughout her      hospitalization and did not have leukocytosis on laboratory data.      In addition, the patient with nebulizer treatments and with her      antibiotic regimen had a rapid improvement in her clinical status      and that she did not have any wheezes and had good air movement on      lung exam on the second day of hospitalization.  The patient is      being discharged home in stable and improved condition and she is      to finish her Avelox for a total treatment duration 7 days.  In      addition, she is to finish her Tamiflu for a total duration of      treatment 5 days,  2. Bilateral calf tenderness.  The patient endorsed some calf      tenderness to both the ED physician and the admitting intern      resident, although she was not tachycardic, she was slightly short      of breath.  They proceeded to get a CT angiogram which did not show      any evidence of pulmonary embolus.  3. Acute renal failure.  The patient has a history of chronic renal      insufficiency with a baseline creatinine of 1.2-1.79.  Her      creatinine at admission was 1.0.  The patient's creatinine on the  second day of her hospitalization increased to 1.49.  The primary      team believed this was largely due to the contrast she  received for      her CT angiogram in addition to receiving a dose of Lasix of 80 mg      in the emergency department and has been said prior that the      patient is also noncompliant with her primary regimen of Lasix.      The patient had her Lasix subsequently held on her second and third      day of hospitalization and was given IV fluids in response to a      urine sodium of less than 10 and a fractional excretion of sodium      of 0.08%.  The patient's creatinine responded to IV fluids and      decreased from 1.49-1.32 on the day of discharge.  It will be      important for Dr. Hartley Barefoot to get a basic metabolic panel      and assess the patient's creatinine status follow up.  4. Hypertension.  The patient was placed on her home meds at the time      of admission.  Secondary to her bump in creatinine, the patient's      Lasix was held on her second and third day of hospitalization.  Her      blood pressure remained stable on her home dose of Norvasc and      Coreg.  The patient will be restarted on her home dose of Lasix,      Coreg, and Norvasc at the time of discharge.  Her medications can      be adjusted as needed at the time of follow up.   DISCHARGE VITAL SIGNS:  Temperature 98.8, blood pressure 127/74, pulse  78, respirations 16, and oxygen saturation 96% on room air.   DISCHARGE LABORATORY DATA:  Sodium 136, potassium 3.6, chloride 102,  bicarb 24, BUN 13, and creatinine 1.32.   The patient is being discharged home in stable and improved condition.      Genia Del, MD  Electronically Signed      Ileana Roup, M.D.  Electronically Signed    ZF/MEDQ  D:  02/12/2008  T:  02/12/2008  Job:  [id number]   cc:   Hartley Barefoot, MD

## 2010-07-11 NOTE — Discharge Summary (Signed)
Vanessa Palmer, PEED NO.:  [id number]   MEDICAL RECORD NO.:  [id number]          PATIENT TYPE:  OBV   LOCATION:  2621                         FACILITY:  [facility]   PHYSICIAN:  Mick Sell, MD DATE OF BIRTH:  [birth year]   DATE OF ADMISSION:  10/27/2007  DATE OF DISCHARGE:  10/28/2007                               DISCHARGE SUMMARY   DISCHARGE DIAGNOSES:  1. Chest pain likely gastrointestinal etiology or musculoskeletal.  2. Uncontrolled hypertension.  3. Medical nonadherence.  4. Coronary artery disease with an non-ST elevation myocardial      infarction in May 2003.  5. Hyperlipidemia.  6. Ethanol abuse.  7. Anxiety.  8. Obstructive sleep apnea.   DISCHARGE MEDICATIONS:  1. P.o. aspirin 81 mg.  2. P.o. ibuprofen 600 mg t.i.d. x7 days and then q.8 h. p.r.n.      afterwards.  3. P.o. Zocor 40 mg daily.  4. P.o. Flexeril 5 mg b.i.d.  5. P.o. Lasix 40 mg b.i.d.  6. P.o. omeprazole 40 mg daily.  7. P.o. Coreg 12.5 mg b.i.d.   Please note that the patient had not been taking any of her medications  prior to admission.  She said that the cost of all the medications was  too much even at $4 through the [place] plan.  In order to try and get  her onto some medications, we asked her to fill the Coreg 12.5 mg b.i.d.  and Lasix 40 mg b.i.d. for her uncontrolled hypertension.  At this time,  we will not try to reinstitute the Norvasc and Benicar.  Perhaps as an  outpatient she will become more adherent and can continue with those  medications or something that is less expensive.   CONDITION ON DISCHARGE:  Stable.  The patient is to followup in the  outpatient clinic.  She has an appointment scheduled for November 23, 2007 at 3:00 p.m. with Dr. Joaquin Courts.  The patient is to have a  BMET and a CBC drawn at that time.   PROCEDURES:  The patient had a chest x-ray that showed:  1. Cardiac enlargement and vascular congestion without overt pulmonary  edema.  2. Streaky bibasilar atelectasis, right greater than left on October 27, 2007.   CONSULTATIONS:  None.   ADMISSION HISTORY AND PHYSICAL:  The patient is a 60 year old with  hyperlipidemia, hypertension, obstructive sleep apnea, coronary artery  disease, and chronic medical noncompliance who presents with 1-week  history of substernal intermittent chest pain/pressure that is  exertional and is associated with diaphoresis, dizziness, and dyspnea.  The pain is mostly a waxing and waning up to 10/10 pressure with  occasional sharp pain.  The pain is improved with nitroglycerin and  nothing worsens the pain.  There is no relationship to food.  The  patient had similar but worse episodes around 1 year ago that was  noncardiac.  The patient did have a NSTEMI in September 2003 and has  chronically elevated blood pressure and is noncompliant with her  medications.   PHYSICAL EXAMINATION:  VITAL SIGNS:  The  patient was afebrile with  temperature of 98.2, blood pressure of 217/103.  The patient was put on  a nitroglycerin drip in the ED and her blood pressure came down to  167/98.  She had a pulse of 72, respiratory rate of 18, oxygen  saturation 99% on 2 L and her weight is 134 kg.  GENERAL PHYSICAL APPEARANCE:  Cheerful at first vapid later on.  ENT:  Poor dentition.  Moist membranous mucosa.  Pink and moist  oropharynx.  RESPIRATORY:  Clear to auscultation bilaterally with good air movement.  CARDIOVASCULAR:  Distant heart sounds.  Regular rate and rhythm.  No  murmurs, rubs, or gallops.  Tender to palpation in the chest wall  anteriorly.  GI:  Soft, obese, nontender to deep palpation plus normoactive bowel  sounds.  EXTREMITIES:  No cyanosis, clubbing, or edema.  Mild bilateral  tenderness to touch in her calves.  She had no swelling, no erythema,  and no asymmetry in her calves.  NEUROLOGIC:  Cranial nerves II through XII intact.  5/5 bilateral upper  and lower extremity  strength.   LABORATORY DATA:  Initial BMET, sodium of 140, potassium 3.8, chloride  of 105, bicarbonate 29, BUN of 17, creatinine of 1.3, and glucose 104.  White blood cell count of 4.8, hemoglobin 12.4, platelet count of 201,  and MCV of 89.  RDW of 13.8.  D-dimer of 0.75.   HOSPITAL COURSE:  1. Chest pain.  The patient's differential for chest pain was broad      given her history of coronary artery disease and NSTEMI.  She was      ruled out for acute coronary syndrome with 3 sets of cardiac      enzymes all of which were negative for myocardial ischemia as well      as an EKG that did not show any ST changes.  Given her history of      ethanol abuse and obesity and GI issues including peptic ulcer      disease, GERD, and dyspepsia were considered, she has been      prescribed a PPI upon discharge to prophylactically help with      dyspepsia and with GERD.  It is also possible that the pain was      musculoskeletal given that she had tenderness to palpation in her      sternal area.  The pain was relieved with Toradol and the patient      was prescribed ibuprofen 600 mg t.i.d. x7 days and then 600 mg q.8      h. p.r.n. after that for relief of MSK related chest pain.  The      patient should follow up in the outpatient clinic for further      consideration of her likely MSK/GI related chest pain if it      persists.  2. Uncontrolled hypertension.  The patient came in with a blood      pressure of 217/103 and it was brought down to 167/98.  Per      outpatient records, she had persistently had a problem with blood      pressure having a systolic in the 190s upon her last appointment.      She has not been able to afford any of her blood pressure      medications.  Social work was contacted for potential help with the      assistance in payment, but she has health insurance so they  were      not able to provide any cost support.  She was discharged on Coreg      and Lasix because  they are on the $4 [employer] plan.  She was not      given a prescription for Norvasc or Benicar because she was adamant      that she would not be able to afford those medications.  She should      have her hypertension followed up in clinic for better blood      pressure control.  3. Hyperlipidemia.  She had a fasting lipid panel in the hospital that      showed an LDL of 110, HDL of 36, and triglyceride level of 56.  She      was continued on her Zocor as an outpatient, but once again she      said that she would not be able to afford the medication.  She      should have further followup in the outpatient clinic.  4. Coronary artery disease.  The chest pain as a chief complaint does      not appear to be in relationship to her coronary artery disease      with known NSTEMI in May 2003.  She had negative cardiac enzymes      and negative EKG on this hospitalization.  Reducing her blood      pressure and keeping her hyperlipidemia under control are important      in reducing her cardiac risk.  Unfortunately, with her medical      noncompliance, this is difficult.   DISCHARGE VITAL SIGNS:  The patient's last set of vitals was temperature  of 98.8, systolic blood pressure of 155, diastolic blood pressure of 88,  pulse of 82, respiratory rate of 16, and saturations 94% on room air.   DISCHARGE LABORATORY DATA:  The patient's last set of BMET was sodium  136, potassium 3.9, chloride of 102, bicarbonate 29, BUN of 14,  creatinine of 1.17, glucose 102.  Her last CBC was hemoglobin 11.9,  white blood cell count of 4.3, and platelets of 172.  She also had a BMP  that was 37.   PENDING LABORATORY DATA:  There were no pending labs at this time.      Linward Foster, MD  Electronically Signed      Mick Sell, MD  Electronically Signed    LW/MEDQ  D:  10/29/2007  T:  10/30/2007  Job:  [id number]   cc:   Joaquin Courts, MD

## 2010-07-11 NOTE — Discharge Summary (Signed)
NAMEBOWEN, GOYAL               ACCOUNT NO.:  [id number]   MEDICAL RECORD NO.:  [id number]          PATIENT TYPE:  OBV   LOCATION:  6527                         FACILITY:  [facility]   PHYSICIAN:  Beatrix Fetters, M.D.     DATE OF BIRTH:  1950/06/21   DATE OF ADMISSION:  07/08/2006  DATE OF DISCHARGE:  07/11/2006                               DISCHARGE SUMMARY   DISCHARGE DIAGNOSES:  Include:  1. Chest pain, secondary to likely gastroesophageal reflux disease.  2. Hypertension.  3. Chronic renal insufficiency.  4. Alcohol abuse.   DICTATION ENDED AT THIS POINT.      Beatrix Fetters, M.D.  Electronically Signed     CA/MEDQ  D:  07/11/2006  T:  07/11/2006  Job:  [id number]

## 2010-07-14 NOTE — Discharge Summary (Signed)
NAMEMAGDALENE, TARDIFF               ACCOUNT NO.:  [id number]   MEDICAL RECORD NO.:  [id number]          PATIENT TYPE:  INP   LOCATION:  6525                         FACILITY:  [facility]   PHYSICIAN:  Eliseo Gum, M.D.   DATE OF BIRTH:  [birth year]   DATE OF ADMISSION:  11/15/2004  DATE OF DISCHARGE:  11/18/2004                                 DISCHARGE SUMMARY   DISCHARGE DIAGNOSES:  1.  Coronary artery disease, status post myocardial infarction 2004 and      2005, with new onset chest pain status post cardiac catheterization      November 17, 2004, with no significant changes.  2.  Hypertension.  3.  Chronic renal insufficiency.  4.  Hyponatremia.  5.  Hypokalemia.  6.  Hyperglycemia.  7.  Morbid obesity.   DISCHARGE MEDICATIONS:  1.  Metoprolol 25 mg p.o. b.i.d.  2.  Cozaar 50 mg p.o. daily.  3.  Lasix 40 mg p.o. daily.  4.  Aspirin 325 mg p.o. daily.  5.  K-Dur 20 mEq p.o. daily.  6.  Patient was instructed to discontinue her hydrochlorothiazide and      atenolol.   Patient was discharged in stable condition and arrangements were made for  the patient to follow up with Dr. Landis Martins in the outpatient clinic.  She was  also to follow up with Dr. Sharyn Lull as well.   PROCEDURES:  1.  Cardiac catheterization on November 17, 2004, which revealed good left      ventricular systolic function, LVH with ejection fraction of 50 to 55%.      Left main was short which has almost separate ostium.  Her LAD was large      and patent.  The diagonal I was small and patent.  Diagonal II was      patent.  Diagonal III and diagonal IV were small and patent.  Left      circumflex was 10 to 15% stenosed proximally.  OM I was very small,      diffusely diseased.  OM II was large and patent.  OM III was small and      patent and OM IV was small and patent as well.  The RCA was patent      proximally but was diffusely diseased and a very small vessel not      suitable for any interventions.   Aortography showed no aortic aneurysm      and bilateral renal arteries were patent.  2.  Chest x-ray on November 15, 2004, showed no acute disease.  3.  CT angio of the chest was negative for  PE.  Type II bilateral      atelectasis/scarring was noted.  Small right pleural effusion was noted.   CONSULTATIONS:  Mohan N. Sharyn Lull, M.D., cardiology.   ADMISSION HISTORY AND PHYSICAL:  Ms. Patchell is a 60 year old African  American female with history of coronary artery disease, status post non-Q-  wave MI in 2004 on medical management with catheterization that revealed  diffuse RCA disease at that time.  Patient states that she started having  chest pain the night prior to admission but did not want to come to the ER  as she had an appointment in the outpatient clinic on the day of admission.  Her pain was constant but waxed and waned.  It started substernally and  radiated upwards to her anterior neck.  It was associated with diaphoresis  and shortness of breath.  The patient denied any nausea or vomiting or loss  of consciousness and the pain was rated at a 3/10 after nitroglycerin x3.  The patient admits to three pillow orthopnea and PND.  The patient claims to  be compliant with her medications and denies syncope, lightheadedness or  diplopia.   PHYSICAL EXAMINATION:  GENERAL APPEARANCE:  She was in no acute distress,  morbidly obese female.  VITAL SIGNS:  Temperature 97.9, blood pressure 204/109, pulse 59,  respirations 18, O2 saturation 98% on room air.  HEENT:  Pupils are equal, round and reactive to light.  Extraocular  movements intact.  Sclerae are anicteric.  Moist mucous membranes.  ENT  showed no thrush.  NECK:  Supple with no JVD.  Her neck was obese.  LUNGS:  Clear to auscultation bilaterally with no wheezing, rhonchi or  rales.  CARDIOVASCULAR:  S1 and S2 were present.  Distant heart sounds.  No murmurs,  rubs, or gallops were appreciated.  ABDOMEN:  She was obese.  Her  abdomen was soft, nontender and nondistended  with bowel sounds present.  EXTREMITIES:  No edema.  SKIN:  Moist.  LYMPHS:  No lymphadenopathy.  NEUROLOGIC:  Cranial nerves II-XII grossly intact.  Exam grossly nonfocal.   ADMISSION LABORATORY DATA:  Sodium 139, potassium 3.8, chloride 103, bicarb  29, BUN 15, creatinine 1.4, glucose 124.  White blood cell count 4.9,  hemoglobin 12.6, hematocrit 37.0, MCV 89.3, platelets 195; absolute  neutrophil count 3.1.  Bilirubin 1.1, alkaline phosphatase 95, AST 25, ALT  22, protein 7.7, albumin 3.2, calcium 8.7.  D-dimer 1.17.  PT 13.3, PTT 31,  INR 1.0.  BNP 348.   EKG was normal sinus rhythm with left atrial enlargement.  No ST or T-wave  changes were noted.  CPK was 188, MB 1.0, troponin I was 2.51.   HOSPITAL COURSE:  PROBLEM #1 -  CORONARY ARTERY DISEASE:  Patient is status  post an MI in 2004 and 2005 and she complained of new onset chest pain on  the day of admission.  The patient's cardiac enzymes were elevated with a  troponin I of 2.51 on the day of admission and she was cathed the day after  admission on November 17, 2004, for this chest pain.  The patient did not  have any EKG changes and her chest pain did resolve with nitroglycerin drip.  She was started on a heparin drip as well on the day of admission and this  was continued until her catheterization.  The cardiac catheterization showed  no significant changes from the one done previously most recently, January  2004.  The patient had a positive Persantine Myoview in May 2004, which was  positive for ischemia but the patient had opted for medical management at  that time.  Given the patient's known history of coronary artery disease  plus her risk factors including a strong family history, the cardiac  catheterization was done and the results are listed above.  Patient was discharged on beta-blocker, metoprolol 25 mg p.o. b.i.d. plus aspirin 325 mg  p.o. daily and close follow-up  was arranged for the  patient with Dr. Sharyn Lull  as well.  The patient was pain-free on the day of discharge and was  tolerating her medications very well.   PROBLEM #2 -  HYPERTENSION:  Patient has a known history of hypertension.  The cardiac catheterization revealed that both of her renal arteries were  patent, not stenosed.  She was discharged on metoprolol 25 mg p.o. b.i.d.,  Cozaar 50 mg p.o. daily, Lasix 40 mg p.o. daily in order to better control  her blood pressure.  When she was admitted, her blood pressure was extremely  elevated at 204/109.  On the day of discharge it had come down significantly  and was 100/50.  She was also discharged on potassium chloride at 20 mEq  p.o. daily in order to help with the side effects of the Lasix of  hypokalemia.   PROBLEM #3 -  HYPONATREMIA AND HYPOKALEMIA:  This was most likely secondary  to the hydrochlorothiazide that the patient came in on and resolved nicely  with supplementation and IV fluids.   PROBLEM #4 -  CHRONIC RENAL INSUFFICIENCY:  This was most likely secondary  to her hypertension and she was experiencing an acute on chronic  exacerbation of her renal insufficiency which her creatinine slowly  decreased after gentle hydration with IV fluids and was 1.2 on November 16, 2004.  She had the cardiac catheterization on November 17, 2004, and her  creatinine bumped back up to 1.4 and was at 1.4 on the day of discharge.  This was mostly likely secondary to the introduction of contrast dye for the  angiography and should resolve with further p.o. hydration of the patient.  It should be monitored as an outpatient, however, especially considering the  patient was started on Lasix during this admission.   PROBLEM #5 - HYPERGLYCEMIA:  The patient was on D5 normal saline while  inpatient and it was noted that she had some episodes of hyperglycemia with  no history of diabetes mellitus.  A hemoglobin A1c, however, was checked and  it  was 6.0 which is normal.  This should be followed as an outpatient as the  patient is at risk for developing diabetes given her body habitus and eating  habits as well as family history.   DISCHARGE LABORATORY DATA AND VITAL SIGNS:  Lipid profile on November 18, 2004, showed a total cholesterol of 178, triglycerides 86, HDL cholesterol  36, LDL cholesterol 125, VLDL 17.  Hemoglobin A1c 6.0.  White blood cell  count 6.3, hemoglobin 12.1, hematocrit 35.0, platelet count 175.  Sodium  134, potassium 3.9, chloride 101, bicarb 27, glucose 108, BUN 13, creatinine  1.4, calcium 7.9.  UDS on November 15, 2004, was completely negative.   PENDING LABS:  None.     ______________________________  Chauncey Reading, D.O.    ______________________________  Eliseo Gum, M.D.    EA/MEDQ  D:  01/11/2005  T:  01/12/2005  Job:  [id number]   cc:   Eduardo Osier. Sharyn Lull, M.D.  Fax: [phone]

## 2010-07-14 NOTE — Discharge Summary (Signed)
NAME:  Vanessa Palmer, Vanessa Palmer                         ACCOUNT NO.:  [id number]   MEDICAL RECORD NO.:  [id number]                   PATIENT TYPE:  INP   LOCATION:  3739                                 FACILITY:  [facility]   PHYSICIAN:  Mohan N. Sharyn Lull, M.D.              DATE OF BIRTH:  1950/09/22   DATE OF ADMISSION:  07/24/2002  DATE OF DISCHARGE:  07/28/2002                                 DISCHARGE SUMMARY   ADMISSION DIAGNOSES:  1. Probable small non-Q-wave myocardial infarction.  2. Small vessel distal coronary artery disease.  3. Hypertension.  4. Morbid obesity.  5. Elevated blood sugars rule out diabetes mellitus.  6. Postmenopausal.  7. Alcohol abuse.   FINAL DIAGNOSES:  1. Status post small non-Q-wave myocardial infarction, positive Persantine     Cardiolite.  2. Hypertension.  3. Morbid obesity.  4. Postmenopausal.  5. Alcohol abuse.   DISCHARGE MEDICATIONS:  1. Toprol XL 100 mg 1 tablet daily.  2. Norvasc 10 mg 1 tablet daily.  3. Imdur 60 mg 1 tablet daily in the morning.  4. Baby aspirin 81 mg 1 tablet daily.  5. Plavix 75 mg 1 tablet daily with food.  6. Nitrostat 0.4 mg sublingual, use as directed.   ACTIVITY:  Avoid heavy lifting, pushing, or pulling.   DIET:  Low-salt, low-cholesterol, weight-reducing diet.  The patient has  been advised to avoid excessive alcohol intake.   FOLLOW UP:  Follow up with myself in one week.   CONDITION ON DISCHARGE:  Stable.   BRIEF HISTORY AND HOSPITAL COURSE:  [name] is a 60 year old black  female with the past medical history significant for coronary artery  disease, history of non-Q-wave MI in August 2003, hypertension, morbid  obesity, history of alcohol abuse, postmenopausal, positive family history  of coronary artery disease.  She came to the ER via EMS yesterday  complaining of precordial chest pain and nausea with pain radiating to the  left arm for which she received three sublingual nitroglycerin with  relief  of chest pain.  She denies any vomiting or diaphoresis.  The patient's  states she was drinking alcohol and did not remember the duration of the  chest pain.  She denies any palpitations, light-headedness, or syncope.  She  denies PND, orthopnea, or leg swelling.  She denies cough, fever, or chills.  She denies abdominal pain or urinary complaints.   PAST MEDICAL HISTORY:  As above.   PAST SURGICAL HISTORY:  She has a hysterectomy 30-plus years ago.  She had a  tubal ligation 31 years ago prior to the hysterectomy.  She had a left  catheterization in January 2004, that showed small distal small-vessel  disease in RCA and obtuse marginal 1, with EF of 55-60%.   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  No  history of smoking.  She drinks hard liquor off and  on since age of 16.  No  history of cocaine nor drug abuse.   FAMILY HISTORY:  Positive for coronary artery disease.   MEDICATIONS:  At home, she is on:  1. Norvasc 10 mg p.o. daily.  2. Baby aspirin 81 mg p.o. daily.  3. Plavix 75 mg p.o. daily.  4. Toprol XL 100 mg p.o. daily.  5. Hydrochlorothiazide 25 mg p.o. daily.   ALLERGIES:  She is allergic to ACE inhibitors.  She has history of  angioedema secondary to ACE.   PHYSICAL EXAMINATION:  GENERAL:  She is alert, awake, and oriented x3 in no  apparent distress.  VITAL SIGNS:  Blood pressure 138/90, pulse 96 and regular.  __________.  NECK:  Supple, no JVD, no bruit.  LUNGS:  Clear to auscultation without rhonchi or rales.  CARDIOVASCULAR:  S1, S2 normal.  No S3, S4 gallop.  ABDOMEN:  Soft, obese, bowel sounds were present, nontender.  EXTREMITIES:  There was no clubbing, cyanosis, or edema.   LABORATORY DATA:  EKG show normal sinus rhythm with nonspecific T-wave  changes.  Chest x-ray showed cardiac enlargement, but no acute pulmonary  findings.  Cholesterol was 138, LDL 80, HDL 43.  Cardiac enzymes, CK was 239  and MB 1.2; second set  CK 219 and MB 1.7; third set CK 134 and MB 1.5;  fourth set CK 142, MB 1.5.  Her troponins were slightly elevated at 1.52,  1.40, 1.48, 1.95, and 1.20.  Her hemoglobin was 9.4, hematocrit 32.4, white  count 8.4.  Sodium was 137, potassium 3.4, chloride 105, bicarb 24, blood  sugar 133 with repeat fasting blood sugar of 103, BUN 14, creatinine 1.1.  Her hemoglobin A1c was 6.0.   BRIEF HOSPITAL COURSE:  The patient was admitted to the telemetry unit and  she was ruled in for a small non-Q-wave MI due to elevated troponin I and  typical chest pain.  The patient underwent subsequently a Persantine  Cardiolite which showed inferolateral and anterolateral wall fixed defect  and small reversible ischemia in the apical and anterior region with EF  approximately 62%.  We discussed with the patient regarding various options  of treatment, i.e., left catheterization, possible PTCA stenting was his  medical management as the patient had small diffuse disease in the RCA and  obtuse marginal 1, although this area of small reversible ischemia in the  inferoapical region is new as compared to the area of small-vessel disease.  The patient, at this point, is not willing to proceed  with the catheterization and wanted to be treated medically.  The patient  has been ambulating in the hallway without any problems.  No episodes of  cardiac arrhythmias or chest pain with ambulation.  The patient will be  discharged home on the above medications and will be followed up in the  office in one week.                                               Eduardo Osier. Sharyn Lull, M.D.    MNH/MEDQ  D:  07/28/2002  T:  07/28/2002  Job:  [id number]

## 2010-07-14 NOTE — Discharge Summary (Signed)
NAME:  Vanessa Palmer, Vanessa Palmer                         ACCOUNT NO.:  [id number]   MEDICAL RECORD NO.:  [id number]                   PATIENT TYPE:  INP   LOCATION:  4731                                 FACILITY:  [facility]   PHYSICIAN:  Osvaldo Shipper. Spruill, M.D.             DATE OF BIRTH:  1950/09/21   DATE OF ADMISSION:  11/11/2001  DATE OF DISCHARGE:  11/16/2001                                 DISCHARGE SUMMARY   DISCHARGE DIAGNOSES:  1. Subendocardial infarction.  2. Precordial pain.  3. Old myocardial infarction.  4. Morbid obesity.  5. Hypertension.   HISTORY OF PRESENT ILLNESS:  This is a 60 year old patient who presented  initially to the emergency department at [facility] with almost a  full day of chest discomfort.  It is of note that this patient has a history  of coronary artery disease and non-Q-wave MI found in May 2003.  At that  time, she was noted to have an obtuse marginal occlusion that was not  amenable to PTCA.  Today she complains of nausea.  She attempted sublingual  nitroglycerin for assistance with her pain, but this was unsuccessful.  Upon  evaluation in the emergency department, she was found to have an elevated  troponin of 1.72.  The chemistries were essentially were within normal  limits.  The patient's EKG did not show any acute change.  She is  subsequently admitted for evaluation of a possible non-Q-wave MI.   HOSPITAL COURSE:  The patient was admitted to the medical service and placed  on telemetry, started on IV fluids, serial cardiac markers revealing the  troponin to remain elevated. Electrocardiogram revealed a normal sinus  rhythm with left atrial enlargement but no acute findings.   The patient was seen in consultation with pharmacy for Lovenox therapy.   On 11/13/2001, the patient continued to have chest pain on IV nitroglycerin.   On 11/14/2001, the patient was noted to be without anginal pain.  The  nitroglycerin was then discontinued, and  the patient was started on  nitroglycerin patches.   On 11/15/2001, the patient had another episode of pain that was mostly  relieved with nitroglycerin patches.   On 11/16/2001, the patient was discharged home having received maximal  benefit from this hospitalization.   FOLLOW UP:  The patient is to be followed as an outpatient by Dr. Sharyn Lull by  possible repeat catheterization.  The patient is to return to the office in  four to five days.   DISCHARGE MEDICATIONS:  1. Lopressor 25 mg b.i.d.  2. Zocor 20 mg daily.  3. Altace 10 mg daily.  4. Nitroglycerin patch 0.4 mg to chest daily.  5.     Cardizem 180 mg daily.  6. Sublingual nitroglycerin as needed.  7. Percocet 10/3.25 one every 4 to 6 hours as needed for pain.     Ivery Quale, [place]  Osvaldo Shipper. Spruill, M.D.    HB/MEDQ  D:  12/11/2001  T:  12/12/2001  Job:  [id number]

## 2010-07-14 NOTE — Discharge Summary (Signed)
NAMEMarland Kitchen  Vanessa Palmer, Vanessa Palmer                         ACCOUNT NO.:  [id number]   MEDICAL RECORD NO.:  [id number]                   PATIENT TYPE:  INP   LOCATION:  2016                                 FACILITY:  [facility]   PHYSICIAN:  Robynn Pane, M.D.               DATE OF BIRTH:  22-Nov-1950   DATE OF ADMISSION:  10/12/2001  DATE OF DISCHARGE:  10/15/2001                                 DISCHARGE SUMMARY   ADMISSION DIAGNOSES:  1. Angina.  2. Alcohol abuse.  3. Abnormal cardiac enzymes.   FINAL DIAGNOSES:  1. Small non-Q-wave myocardial infarction.  2. Hypertension.  3. Morbid obesity.  4. Alcohol abuse.  5. Positive family history of coronary artery disease.  6. Surgical menopause.   DISCHARGE MEDICATIONS:  1. Toprol XL 25 mg 1 tablet q.d.  2. Altace 10 mg 1 tablet q.d.  3. Imdur 30 mg 1 tablet q.d. in the morning.  4. Coated aspirin 325 mg 1 tablet q.d.  5. Plavix 75 mg 1 tablet q.d. with food.  6. Protonix 40 mg 1 tablet q.d.  7. Nitroglycerin 0.4 sublingual p.r.n.   ACTIVITY:  Avoid heavy lifting, pushing or pulling.   DIET:  Low salt, low cholesterol, weight reducing diet.  The patient has  been advised to avoid excess alcohol intake.  The patient has been advised  not to drink more than one glass of wine daily.   FOLLOW UP:  Follow up with me in one week.   CONDITION ON DISCHARGE:  Stable.   HOSPITAL COURSE:  The patient is a 60 year old African American female with  past medical history significant for hypertension, morbid obesity, recent  history of small non-Q-wave MI in May of 2003.  Also a family history of  coronary artery disease, surgical menopause.  She came to the ER via EMS  complaining of retrosternal pressure nonradiating, associated with shortness  of breath.  She took two sublingual nitroglycerin with partial relief of  chest pain.   PAST MEDICAL HISTORY:  As above.   PAST SURGICAL HISTORY:  She had tubal ligation in 1973.  She had a  hysterectomy in 1974.   MEDICATIONS:  1. Enteric-coated aspirin 325 mg p.o. q.d.  2. Imdur 60 mg p.o. q.a.m.  3. Actos 2.5 mg p.o. q.d.  4. Thiazide 120 mg p.o. q.d.  5. Nitroglycerin sublingual p.r.n.   SOCIAL HISTORY:  She is divorced and has three children.  No history of  smoking or drug abuse.  She used to drink hard liquor one pint per week for  10+ years.  Quit recently in January of 2003 and then recently started  again.  She was a Estate manager/land agent.   FAMILY HISTORY:  Positive for coronary artery disease.   PHYSICAL EXAMINATION:  GENERAL:  She was alert and oriented x 3.  VITAL SIGNS:  Blood pressure was 120/68, pulse was 89.  NECK:  There  were no JVD.  LUNGS:  Clear to auscultation.  No rhonchi or rales.  CARDIOVASCULAR:  S1 and S2 normal.  ABDOMEN:  Soft.  EXTREMITIES:  No clubbing, cyanosis, or edema.  NEUROLOGICAL:  CNS is intact.   LABORATORY DATA:  EKG showed normal sinus rhythm with nonspecific T-wave  changes.  She had an Adenosine-Cardiolite on October 12, 2001 which showed  questionable mild inferior wall ischemia with an EF of 68%.  Chest x-ray  showed no evidence of acute infiltrate.   Her cholesterol was 136, triglyceride 152, HDL 42, LDL 164.  Her blood  alcohol level was elevated at 238.  Her drug screen was negative for  benzodiazepines.  Her CK was 243, MB of 2.2, creatinine is 0.9, troponin was  2.44 which was elevated.  Second set was 2.47 and third set was 2.25.  Second set CPK was 208, MB of 1.7.  Troponin I first set was 3.52, second  set was 3.54, third set was 2.44, fourth set 2.37.  First hemoglobin was  11.4, hematocrit 32.7, white count of 6.3.  Today, her CPK is 102, MB of  0.9, troponin has come down to 2.23.   HOSPITAL COURSE:  The patient was admitted to telemetry unit.  The patient  ruled out for small non-Q-wave MI due to elevated troponin I and typical  anginal pain.  The patient was started on heparin and IV nitrates which were   discontinued yesterday.  The patient had not had any further episodes of  chest pain during the hospital stay.  The patient did have cardiac  catheterization in May of 2003 which showed diffuse distal RCA stenosis with  vessel size of less than 1.5 mm and OM1 is also diffusely diseased which is  less than 1 mm.  These were felt not suitable for PCI.  The patient will be  treated medically.  We will discuss with the patient regarding cardiac  catheterization findings and medical management in which the patient agrees.   The patient will be discharged home on the above medications and will be  followed up in my office in one week.                                               Robynn Pane, M.D.    MNH/MEDQ  D:  10/15/2001  T:  10/18/2001  Job:  [id number]

## 2010-07-14 NOTE — Discharge Summary (Signed)
NAME:  Vanessa Palmer, Vanessa Palmer                         ACCOUNT NO.:  [id number]   MEDICAL RECORD NO.:  [id number]                   PATIENT TYPE:  INP   LOCATION:  3728                                 FACILITY:  [facility]   PHYSICIAN:  Mohan N. Sharyn Lull, M.D.              DATE OF BIRTH:  06/15/1950   DATE OF ADMISSION:  03/07/2002  DATE OF DISCHARGE:  03/12/2002                                 DISCHARGE SUMMARY   ADMISSION DIAGNOSES:  1. Small non-Q-wave myocardial infarction.  2. Severe controlled hypertension.  3. Morbid obesity.  4. History of alcohol abuse.   FINAL DIAGNOSES:  1. Status post small non-Q-wave myocardial infarction.  2. Hypertension.  3. Morbid obesity.  4. History of alcohol abuse.   DISCHARGE MEDICATIONS:  1. Baby aspirin 81 mg one tablet daily.  2. Plavix 75 mg one tablet daily.  3. Toprol-XL 100 mg one tablet daily.  4. Norvasc 100 mg one tablet daily.  5. Nitrostat 0.4 mg sublingual, use as directed.   ACTIVITY:  Avoid heavy lifting, pushing, or pulling.   DIET:  Low salt, low cholesterol, weight-reducing diet.   DISCHARGE INSTRUCTIONS:  Post cardiac catheterization instructions have been  given. Follow up with me in one week.   CONDITION ON DISCHARGE:  Stable.   BRIEF HISTORY AND HOSPITAL COURSE:  Ms. Kealey is a 60 year old black  female with past medical history significant for coronary artery disease,  hypertension, morbid obesity, noncompliance to medications. Was admitted by  Dr. Shana Chute on 03/08/02 complaining of retrosternal chest pain radiating to  the left arm, lasting more than one hour on Saturday night, associated with  diaphoresis. The patient had similar chest pain in 5/03 when she ruled in  for a small non-Q-wave MI and subsequently had left cardiac catheterization  and was noted to have small vessel disease in OM1 and distal RCA which was  felt not suitable for PCI. Subsequently, the patient was again admitted in  10/03 with similar  complaints with mild elevation of cardiac enzymes and  subsequently underwent Persantine Cardiolite which showed a small area of  reversible ischemia in the inferior wall. Due to recent prior  catheterization and discussed with patient at that time at length regarding  further management and agreed for medical management. The patient also at  this time was noted to have elevated troponin I and was admitted by Dr.  Shana Chute for further evaluation.   PAST MEDICAL HISTORY:  As above.   PAST SURGICAL HISTORY:  None.   ALLERGIES:  She is allergic to ACE inhibitors. She had angioedema of the  lips and tongue.   SOCIAL HISTORY:  She is single. No history of smoking. Used to drink alcohol  in the past.   FAMILY HISTORY:  Noncontributory.   PHYSICAL EXAMINATION:  VITAL SIGNS:  Admission blood pressure was 211/123,  pulse was 93, respiratory rate 20, temperature 99.7.  NECK:  Supple.  No JVD. No bruits.  LUNGS:  Clear to auscultation.  CARDIOVASCULAR:  S1 and S2 were normal. There was no S3, gallop, murmur, or  rubs.  ABDOMEN:  Soft. Bowel sounds were present. Nontender.  EXTREMITIES:  There was 1+ pitting edema.   LABORATORY DATA:  EKG showed normal sinus rhythm with minor ST-T wave  changes in lateral leads. Labs:  CK was 211, MB of 1.7; second set, 155, MB  1.4; third set, CK 83, MB of 0.7; fourth set, CK 69, MB of 1.0; however, her  troponin I was elevated; it was 3.39, 3.22, 3.10, 3.02, 2.83. Lipid panel:  Her cholesterol was 143, triglycerides 147, HDL 44, LDL 70. Urine drug  screen was negative for cocaine. Her sodium was 135, potassium 3.6, chloride  100, blood sugar 103, BUN 10, creatinine 1.0. Hemoglobin was 12.7,  hematocrit 36.5, white count of 5.0.   HOSPITAL COURSE:  The patient was admitted to telemetry unit. The patient  ruled in for small non-Q-wave myocardial infarction due to typical anginal  pain and elevated troponin I. The patient was started on IV nitrates, beta   blockers, aspirin, Plavix, and Lovenox. The patient did not have any further  episodes of chest pain during the hospital stay. The patient underwent left  cardiac catheterization with selective left and right coronary angiography  on 1/13 which showed occluded OM1 which is a very, very small vessel which  is not suitable for PCI. The patient had good LV systolic function. The  patient had been ambulating in the hallway without any problems. Her groin  is stable. There is no evidence of hematoma or bruit. The patient did not  have any further episodes of chest pain during the hospital stay. The  patient will be discharged home on above medications and will be followed up  in the office in one week.                                               Eduardo Osier. Sharyn Lull, M.D.    MNH/MEDQ  D:  03/12/2002  T:  03/12/2002  Job:  [id number]

## 2010-07-14 NOTE — Procedures (Signed)
Vanessa Palmer, Vanessa Palmer               ACCOUNT NO.:  [id number]   MEDICAL RECORD NO.:  [id number]          PATIENT TYPE:  OUT   LOCATION:  SLEEP CENTER                 FACILITY:  [facility]   PHYSICIAN:  Clinton D. Young, M.D. DATE OF BIRTH:  February 06, 1951   DATE OF STUDY:                              NOCTURNAL POLYSOMNOGRAM   INDICATIONS FOR STUDY:  Hypersomnia with sleep apnea.  Epworth sleepiness  score 14/24, BMI 51.8.  Weight 285 pounds.   HOME MEDICATIONS:  Norvasc, metoprolol, Lasix, aspirin, Cozaar, potassium,  Zocor.   SLEEP ARCHITECTURE:  Total sleep time 332 minutes with sleep efficiency 77%.  Stage I was 16%, stage II 84%.  Stages III, IV, and REM were absent.  Sleep  latency 16 minutes, awake after sleep onset 81 minutes, arousal index 29.3  indicating moderate fragmentation.  No bedtime medication was reported.   RESPIRATORY DATA:  Apnea/hypopnea index (AHI, RDI) 17 obstructive events per  hour indicating mild to moderate obstructive sleep apnea/hypopnea syndrome.  This reflected two central apneas, 22 obstructive apneas, and 70 hypopneas.  Events were not positional with most events and most sleep recorded while in  the right lateral position.  She had insufficient events to permit use of  CPAP titration by split protocol on this study night.   OXYGEN DATA:  Moderately loud snoring with oxygen desaturation to a nadir of  82%.  Mean oxygen saturation through this study was 92% on room air.   CARDIAC DATA:  Normal sinus rhythm.   MOVEMENT SLASH PARASOMNIA:  Occasional leg jerks with insignificant effect  on sleep.   IMPRESSION/RECOMMENDATIONS:  1.  Mild to moderate obstructive sleep apnea/hypopnea syndrome, AHI 17 per      hour with nonpositional events mostly recorded while she was sleeping on      her right side, moderately loud snoring with oxygen desaturation to a      nadir of 82%.  2.  Consider return for CPAP titration or evaluate for alternative therapies  including weight loss as appropriate.      Clinton D. Maple Hudson, M.D.  Diplomate, Biomedical engineer of Sleep Medicine  Electronically Signed     CDY/MEDQ  D:  05/27/2005 14:48:00  T:  05/28/2005 12:10:06  Job:  [id number]

## 2010-07-14 NOTE — Discharge Summary (Signed)
NAME:  Vanessa Palmer, Vanessa Palmer                         ACCOUNT NO.:  [id number]   MEDICAL RECORD NO.:  [id number]                   PATIENT TYPE:  INP   LOCATION:  3714                                 FACILITY:  [facility]   PHYSICIAN:  Mohan N. Sharyn Lull, M.D.              DATE OF BIRTH:  Sep 17, 1950   DATE OF ADMISSION:  12/26/2001  DATE OF DISCHARGE:  12/28/2001                                 DISCHARGE SUMMARY   ADMISSION DIAGNOSES:  1. Progressive angioedema secondary to ACE inhibitors.  2. Coronary artery disease, status post non Q wave myocardial infarction.  3. Uncontrolled hypertension.  4. Morbid obesity.  5. History of alcohol abuse.  6. Positive family history of coronary artery disease.   FINAL DIAGNOSES:  1. Status post angioedema secondary to ACE inhibitors.  2. Coronary artery disease, status post non Q wave myocardial infarction.  3. Hypertension.  4. Morbid obesity.  5. History of alcohol abuse.  6. Positive family history of coronary artery disease.   DISCHARGE MEDICATIONS:  1. Metoprolol XL 50 mg 1 tablet q.d.  2. Norvasc 5 mg 1 tablet q.d.  3. Hydrochlorothiazide 25 mg 1 tablet q.d.  4. Baby aspirin 81 mg 1 tablet q.d.  5. Plavix 75 mg 1 tablet q.d. with food.  6. Pepcid 20 mg 1 tablet b.i.d.  7. Prednisone 20 mg 1 tablet q.d. x 2 days, 1 tablet q.d. x 2 days, 10 mg     tablet q.d. x 2 days, 5 mg q.d. x 2 days and then stop.  8. Imdur 60 mg 1 tablet q. a.m.   DISCHARGE INSTRUCTIONS:  Activity as tolerated. Diet low salt, low  cholesterol, fat reducing diet.   FOLLOW UP:  The patient has been advised to follow up with me in two weeks.   CONDITION ON DISCHARGE:  Stable.   HISTORY OF PRESENT ILLNESS:  The patient is a 60 year old black female with  a past medical history significant for coronary artery disease, status post  non Q wave MI, hypertension, morbid obesity, history of alcohol abuse, post  menopausal, positive family history of coronary artery disease.  She came to  the ER complaining of progressive facial and lip swelling since this  morning. The patient was seen in my office this morning with upper lip  swelling which was noticed this morning. She denies any insect bites, fever  or chills or difficulty in breathing. The patient has been on ACE inhibitors  for the last three to four months but recently was increased to 10 mg. Her  Altace was increased to 10 mg b.i.d. The patient was given a prescription  for prednisone and Benadryl and was advised to stop ACE inhibitor  and go to  the ER if she developed more swelling.   The patient came to the ER as she had noticed progressive swelling of the  face and lips. She denies any tongue swelling or  choking sensation. She  denies problem breathing. The patient did receive 60 mg of prednisone p.o.,  Benadryl, Pepcid and 0.3 mg of subcu epinephrine with minimal improvement in  facial swelling so the patient was  advised for admission.   PAST MEDICAL HISTORY:  As above.   PAST SURGICAL HISTORY:  1. She had a hysterectomy in 1974.  2. Tubal ligation in 1973.   MEDICATIONS:  1. Imdur 60 mg p.o. q. a.m.  2. Toprol XL 50 mg p.o. q.d.  3. Norvasc 5 mg p.o. q.d.  4. Altace 10 mg p.o. b.i.d. which was discontinued.  5. Plavix 75 mg p.o. q.d.  6. Enteric coated aspirin 325 mg p.o. q.d.  7. Protonix 40 mg p.o. q.d.   FAMILY HISTORY:  Noncontributory.   PHYSICAL EXAMINATION:  GENERAL:  She was alert, awake and oriented x 3 in no  acute distress.  VITAL SIGNS:  Blood pressure 155/83, pulse 100 and regular.  HEENT:  Oropharynx pink. She  had generalized facial swelling. There was no  erythema or urticaria. Both upper and lower lips were swollen. The tongue  and posterior pharynx were OK.  NECK:  Supple, no JVD, no bruits.  LUNGS:  Clear to auscultation without rhonchi or rales.  CARDIAC:  S1, S2 were normal. There was no S3, gallop or murmur.  ABDOMEN:  Bowel sounds were present, nontender,  obese.  EXTREMITIES:  No cyanosis, clubbing or edema.   LABORATORY DATA:  A chest x-ray showed mild cardiomegaly, no active  chest  disease radiographically.   Sodium 136, potassium 3.8, chloride 101, CO2 26, glucose 91, BUN 15,  creatinine 1.0. Hemoglobin 9.9, hematocrit 34.3, white count 5.5.   HOSPITAL COURSE:  The patient was admitted to the telemetry unit and was  started on prednisone,  Benadryl and H2 blockers. The patient did not have  any episodes of respiratory distress during the hospital stay. Her facial  swelling and lip swelling gradually resolved. The patient has been  ambulating in the hallway without any problems. The patient will be  discharged on one of her medications. The patient has been advised to stop  Altace and should  not take any ACE inhibitors in the future.                                                Vanessa Palmer. Sharyn Lull, M.D.   MNH/MEDQ  D:  12/28/2001  T:  12/29/2001  Job:  [id number]

## 2010-07-14 NOTE — Op Note (Signed)
Vanessa Palmer, Vanessa Palmer               ACCOUNT NO.:  [id number]   MEDICAL RECORD NO.:  [id number]          PATIENT TYPE:  INP   LOCATION:  6525                         FACILITY:  [facility]   PHYSICIAN:  Mohan N. Sharyn Lull, M.D. DATE OF BIRTH:  Jul 13, 1950   DATE OF PROCEDURE:  11/17/2004  DATE OF DISCHARGE:                                 OPERATIVE REPORT   PROCEDURES:  1.  Left cardiac catheterization.  2.  Selective left and right coronary angiography.  3.  Left ventriculography.  4.  Aortography via left groin using Judkins technique.   INDICATIONS FOR PROCEDURE:  Vanessa Palmer is a 60 year old, African-American  female with past medical history significant for coronary artery disease,  history of non-Q wave MI in the past, positive Persantine Myoview in May  2004, opted for medical management as the patient had prior catheterization  which showed small vessel coronary artery disease distally in distal RCA and  obtuse marginal-1, hypertension, hypercholesterolemia, morbid obesity.  She  complains of retrosternal chest pain described as squeezing in nature  radiating to the neck with mild shortness of breath and diaphoresis off and  on for the last 2-3 days.  She denies any nausea or vomiting.  She denies  PND or orthopnea, leg swelling, palpitations, lightheadedness or syncope.  The patient was noted to have mildly elevated troponin I and normal CPK-MB.  The patient had similar presentation in the past and had catheterization in  January 2004, which showed distal small vessel disease in OM-1 and distal  RCA.  The patient subsequently had Persantine Myoview in May 2004, which was  positive for ischemia, but the patient opted for medical management.   PAST MEDICAL HISTORY:  As above.   PAST SURGICAL HISTORY:  1.  Hysterectomy approximately 30+ years ago.  2.  Tubal ligation prior to hysterectomy.  3.  Left catheterization in January 2004, which showed distal small vessel      diffuse  disease in RCA and OM-1 with EF of 55-60%.  LAD and left      circumflex were otherwise patent.  RCA was patent in proximal and mid      portion.   SOCIAL HISTORY:  She is single with three children.  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  No history of smoking.  She drinks hard liquor occasionally off and on for  15 years.  No history of drug abuse.   FAMILY HISTORY:  Positive for coronary artery disease.   PHYSICAL EXAMINATION:  GENERAL:  Alert, awake and oriented x3.  VITAL SIGNS:  Blood pressure 204/109, pulse 59.  HEENT:  Conjunctivae pink.  NECK:  Supple.  No JVD or bruit.  LUNGS:  Clear to auscultation without rhonchi or rales.  CARDIAC:  S1, S2 normal.  There was soft systolic murmur and S4 gallop.  ABDOMEN:  Soft, obese, nontender.  EXTREMITIES:  No clubbing, cyanosis or edema.   LABORATORY DATA AND X-RAY FINDINGS:  EKG showed normal sinus rhythm with  nonspecific T wave changes.  Troponin I two sets with 2.5, 3.15 and 3.20.  CK-MBs were normal at 1.0, 1.2 and 1.2.  Urine drug screen was negative for  cocaine.   ASSESSMENT:  Due to elevated troponin I, atypical chest pain and positive  Persantine Myoview, discussed with patient regarding recatheterization with  possible PTCA stenting, its risks and benefits, i.e. death, MI, stroke, need  for emergency CABG, risk of restenosis, local vascular complication, etc.  and consulted for the procedure.   DESCRIPTION OF PROCEDURE:  After obtaining the informed consent, the patient  was brought to the catheterization lab and was placed on fluoroscopy table.  The right groin was prepped and draped in usual fashion.  Xylocaine 2% was  used for local anesthesia in the right groin.  With the help of thin-walled  needle, attempted to put 6 French sheath in the right groin without success  as the wire could not be advanced.  Next, a 6 French left sheath was placed  in the left groin.  Sheath was aspirated and flushed.  Next, a 6 French left  Judkins  catheter was advanced over the wire under fluoroscopic guidance up  to the ascending aorta.  Wire was pulled out.  The catheter was aspirated  and connected to the manifold.  Catheter was further advanced and engaged  into the left coronary ostium.  A 6 [place] JL-4 catheter was used for  engaging into the left and JL-5 was used to engage into left circumflex  artery as the patient had very short left main with almost separate ostium  for both vessels.  The catheters were pulled out over the wire and was  replaced with the 6 French right Judkins catheter which was advanced over  the wire under fluoroscopic guidance up to the ascending aorta.  Wire was  pulled out.  The catheter was aspirated and connected to the manifold.  The  catheter was further advanced and engaged into left coronary ostium.  Multiple views of the left system were taken.  Next, the catheter was  disengaged and pulled out over the wire and was replaced with 6 French  pigtail catheter which was advanced over the wire under fluoroscopic  guidance up to the ascending aorta.  Wire was pulled out.  The catheter was  aspirated and connected to manifold.  The catheter was further advanced  across the aortic valve into the LV.  LV pressures were recorded.  Next, LV-  graphy was done in 30-degree RAO position.  Postangiographic pressures were  recorded from LV and then pullback pressures were recorded from the aorta.  There was no gradient across the aortic valve.  Next, pigtail catheter was  pulled down to the abdominal aorta.  Aortograph was then positioned.  Next,  pigtail catheter was pulled out over the wire.  Sheath was aspirated and  flushed.   FINDINGS:  Good LV systolic function.  LVH with EF of 50-55%.  Left main was  short which has almost separate ostium for LAD and left circumflex which was  large and patent.  LAD was large which was patent.  Diagonal-1 was very small which was patent.  Diagonal-2 was small which was  patent.  Diagonal-3  and diagonal-4 were very small which were patent.  Left circumflex has 10-  15% proximal stenosis.  This vessel is also large.  OM-1 is less than 0.5 mm  which is a diffusely diseased and very small vessel.  OM-2 was large which  was patent which bifurcates into superior and inferior branch.  Both  branches were patent.  OM-3 was very small which was patent.  OM-4 was small  which was patent.  RCA was patent in proximal and mid portion distally.  Vessel was diffusely diseased and very  small vessel not suitable for any interventions.  Aortography showed no  aortic aneurysm.  Bilateral renal arteries are patent.  The patient  tolerated the procedure well and there were no complications.  The patient  was transferred to the recovery room in stable condition.           ______________________________  Eduardo Osier Sharyn Lull, M.D.     MNH/MEDQ  D:  11/17/2004  T:  11/17/2004  Job:  [id number]   cc:   Catheterization Lab

## 2010-07-14 NOTE — Cardiovascular Report (Signed)
NAME:  Vanessa Palmer, Vanessa Palmer                         ACCOUNT NO.:  [id number]   MEDICAL RECORD NO.:  [id number]                   PATIENT TYPE:  INP   LOCATION:  3728                                 FACILITY:  [facility]   PHYSICIAN:  Mohan N. Sharyn Lull, M.D.              DATE OF BIRTH:  11/28/1950   DATE OF PROCEDURE:  03/10/2002  DATE OF DISCHARGE:                              CARDIAC CATHETERIZATION   PROCEDURE:  Left cardiac catheterization with selective left and right  coronary angiography, left ventriculography via right groin using Judkins  technique.   INDICATIONS FOR PROCEDURE:  The patient is a 60 year old black female with a  past medical history significant for hypertension, morbid obesity, history  of non-Q-wave MI, positive family history of coronary artery disease, status  post surgical menopauses.  She was admitted by Dr. Shana Chute on March 08, 2002, complaining of retrosternal chest pain without any associated  symptoms.  The patient described the chest pain as chest pressure radiating  to the left arm lasting for more than one hour Saturday night, and was noted  to have elevated three sets of troponin I.  ECG showed nonspecific ST-T wave  changes.  The patient had similar chest pain in May of 2003 and ruled in for  non-Q-wave MI, subsequently had left catheterization which showed small  vessel diffuse OM-1 disease and diffuse RCA disease.  The patient  subsequently had recurrent chest pain requiring admission in August of 2003,  and subsequently had Persantine Cardiolite, which showed questionable small  area of ischemia in the inferior wall.  At that time, the patient opted for  medical management and as the patient had catheterization approximately  three months earlier with no significant coronary artery disease in the  major vessels, the patient was treated medically, but due to recurrent chest  pain and positive troponin I discussed with the patient various options of  treatment and consented for PCI.   DESCRIPTION OF PROCEDURE:  After obtaining the informed consent, the patient  was brought to the catheterization lab and was placed on the fluoroscopy  table.  The right groin was prepped and draped in the usual fashion.  Xylocaine 2% was used for local anesthesia in the right groin.  With the  help of a thin wall needle, a 6 French arterial sheath was placed.  The  sheath was aspirated and flushed. Next, a 6 French left Judkins catheter was  advanced over the wire under fluoroscopic guidance up to the ascending  aorta. The wire was pulled out, the catheter was aspirated and connected to  the manifold.  The catheter was further advanced and  engaged into the left coronary ostium.  Multiple views of the left system  were taken.  Next, the catheter was disengaged and was pulled out over the  wire and was replaced with a 6 French right Judkins catheter, which was  advanced over the wire  under fluoroscopic guidance up to the ascending  aorta.  The wire was pulled out, the catheter was aspirated and connected to  the manifold.  The catheter was further advanced and engaged into the right  coronary ostium.  Multiple views of the right system were taken. Next, the  catheter was disengaged and was pulled out over the wire and was replaced  with a 6 French pigtail catheter, which was advanced over the wire under  fluoroscopic guidance up to the ascending aorta.  The wire was pulled out,  the catheter was aspirated and connected to the manifold.  The catheter was  further advanced across the aortic valve into the LV.  LV pressures were  recorded.  Next, LV graph was done in 30-degree RAO position.  Postangiographic pressures were recorded from LV and then pullback pressures  were recorded from the aorta.  There was no gradient across the aortic  valve.  Next, the pigtail catheter was pulled out, the sheaths were  aspirated and flushed.   FINDINGS:  LV showed good  LV systolic function, EF of 55-60%.   Left main was short, which was patent.   LAD was patent.  Diagonal #1 was very small, which was patent. Diagonal #2  and 3 were small, which were patent.   The left circumflex has 10-20% proximal and mid stenosis.  OM-1 is very  small, which is diffusely diseased. OM-2 is large, which bifurcates into  superior and inferior branches, which is patent.  OM-3 is very small.  OM-4  is small which is patent.   RCA is small which is patent proximally in midportion but diffusely diseased  distally, which is small vessel.   The patient had codominant coronary system.  The patient tolerated the  procedure well.  There were no complications. The patient was transferred to  recovery room in stable condition.                                                  Eduardo Osier. Sharyn Lull, M.D.    MNH/MEDQ  D:  03/10/2002  T:  03/10/2002  Job:  [id number]   cc:   Cardiac Catheterization Laboratory

## 2010-07-14 NOTE — Cardiovascular Report (Signed)
[place]. [facility]  Patient:    Vanessa Palmer, Vanessa Palmer Visit Number: [id number] MRN: [id number]          Service Type: MED Location: [facility] 2927 01 Attending Physician:  Robynn Pane Dictated by:   Eduardo Osier Sharyn Lull, M.D. Proc. Date: 07/17/01 Admit Date:  07/16/2001 Discharge Date: 07/18/2001                          Cardiac Catheterization  PROCEDURE: Left cardiac catheterization with selective left and right coronary angiography, left ventriculography via right groin using Judkins technique.  INDICATIONS FOR PROCEDURE: The patient is a 60 year old black female with a past medical history significant for hypertension, morbid obesity, positive family history of coronary artery disease, status post surgical menopause, came to the ER at [facility] complaining of retrosternal chest pain off and on radiating to both arms associated with shortness of breath since yesterday morning. Took Tums without relief. Today pain was grade 9/10 so she decided to come to the ER. The patient received sublingual nitroglycerin with partial relief and was started on IV nitroglycerin, heparin and IIb/IIIa inhibitors with relief of chest pain. ECG shows sinus bradycardia with nonspecific ST-T wave changes and on her labs, her troponin I was significantly elevated which, was 3.39, although CPKs were within normal range. The patient denies any such episodes of chest pain in the past, denies PND, orthopnea, leg swelling. Denies palpitations, lightheadedness or syncope. Denies nausea, vomiting, or diaphoresis. Denies fever, chills, cough.  PAST MEDICAL HISTORY: As above.  PAST SURGICAL HISTORY: She had partial hysterectomy in 1974, had tubal ligation in 1973.  ALLERGIES: No known drug allergies.  MEDICATIONS AT HOME: 1. She was on atenolol 100 mg p.o. q.d. 2. Hydrochlorothiazide 1 tablet daily. 3. Enteric-coated aspirin 1 tablet daily. 4. Reserpine 0.1 mg p.o. q.d. 5.  Percogesic.  SOCIAL HISTORY: She is divorced, has three children. No history of smoking or drug abuse. Used to drink hard liquor one pint per week for 10+ years, quit in January of 2003. She was an Midwife at ______.  FAMILY HISTORY: Positive for coronary artery disease.  PHYSICAL EXAMINATION:  GENERAL: On examination, she is alert, awake, oriented x3 in no acute distress.  VITAL SIGNS: Blood pressure was 158/71, pulse is 58 regular.  HEENT: Conjunctiva pink.  NECK: Supple, no JVD, no bruits.  LUNGS: Lungs are clear to auscultation without rhonchi or rales.  CARDIOVASCULAR: S1 and S2 was soft. There was no S3 gallop.  ABDOMEN: Soft, bowel sounds are present.  Obese, nontender.  EXTREMITIES: There is no clubbing, cyanosis or edema.  LABORATORY DATA: As stated, her troponin was elevated 3.39, second set was 3.30. Patients CPKs were within normal range.  ECG shows sinus bradycardia with nonspecific T wave changes. Due to typical anginal pain and significantly elevated troponin I and multiple risk factors, the patient was advised for left catheterization, possible PCI.  DESCRIPTION OF PROCEDURE: After obtaining the informed consent, the patient was brought to the catheterization lab and was placed on the fluoroscopy table.  The right groin was prepped and draped in the usual fashion. Xylocaine 2% was used for local anesthesia in the right groin. With the help of a thin-walled needle, a 6 French arterial sheath was placed. The sheath was aspirated and flushed. Next, a 6 French left Judkins catheter was advanced over the wire under fluoroscopic guidance up to the ascending aorta. The wire was pulled out, the catheter was aspirated  and connected to the manifold. The catheter was further advanced and engaged into the left coronary ostium. Multiple views of the left system were taken. Next, the catheter was disengaged and was pulled out over the wire and was replaced with a 6  French right Judkins catheter, which was advanced over the wire under fluoroscopic guidance up to the ascending aorta. The wire was pulled out, the catheter was aspirated and connected to the manifold. The catheter was further advanced and engaged into the right coronary ostium. Multiple views of the right system were taken.  Next, the catheter was disengaged and was pulled out over the wire and was replaced with 6 French pigtail catheter, which was advanced over the wire under fluoroscopic guidance up to the ascending aorta. The catheter was further advanced across the aortic valve into the LV. LV pressures were recorded. Next, left ventriculography was done in 30-degree RAO position. Post angiographic pressures were recorded from LV and then pullback pressures were recorded from the aorta. There was no gradient across the aortic valve. Next, the pigtail catheter was pulled out over the wire, sheaths were aspirated and flushed.  FINDINGS: LV was mildly enlarged, good left ventricular systolic function, EF was 50-55%.  Left main was short, which was patent.  LAD has 10-15% mid stenosis. Diagonal #1 is small which is patent. Diagonal #2 is very small, which is also patent.  The left circumflex is patent which tapers down in AV groove after giving off very small OM-4. OM-1 is very small. OM-2 is medium sized which is patent. OM-3 is very small, which is patent. OM-4 is very small which is patent.  Right coronary artery is small and is diffusely diseased distally.  Arteriotomy was closed with Perclose. The patient tolerated the procedure well. There were no complications. The patient was transferred to recovery room in stable condition. Dictated by:   Eduardo Osier Sharyn Lull, M.D. Attending Physician:  Robynn Pane DD:  07/17/01 TD:  07/19/01 Job: [id number]

## 2010-07-14 NOTE — Discharge Summary (Signed)
[place]. [facility]  Patient:    Vanessa Palmer, ASHMORE Visit Number: [id number] MRN: [id number]          Service Type: MED Location: [facility] 2927 01 Attending Physician:  Robynn Pane Dictated by:   Eduardo Osier Sharyn Lull, M.D. Admit Date:  07/16/2001 Discharge Date: 07/18/2001                             Discharge Summary  ADMITTING DIAGNOSES: 1. Probable small non-Q wave myocardial infarction. 2. Hypertension. 3. Morbid obesity. 4. Status post surgical menopause. 5. Positive family history of coronary artery disease.  DISCHARGE DIAGNOSES: 1. Status post probable small, non-Q wave myocardial infarction secondary t    vasospasm. 2. Hypertension. 3. Morbid obesity. 4. Status post surgical menopause. 5. Anemia secondary to hydration, gum bleeding and blood loss during    procedure. 6. Positive family history of coronary artery disease.  DISCHARGE MEDICATIONS: 1. Enteric coated aspirin 325 mg one tablet daily. 2. Imdur 60 mg one tablet daily every morning. 3. Altace 2.5 mg one capsule daily. 4. Tiazac 120 mg one capsule daily. 5. Nitrostat 0.4 mg sublingual as instructed.  ACTIVITY:  Avoid heavy lifting, pushing or pulling.  DIET:  Low salt, no cholesterol, weight-reducing diet.  SPECIAL INSTRUCTIONS:  Postcatheterization instructions have been given.  FOLLOWUP:  Follow up with Dr. Sharyn Lull in one week.  CONDITION ON DISCHARGE:  Stable.  HISTORY OF PRESENT ILLNESS:  Ms. [name] is a 60 year old, African-American female with past medical history significant for hypertension, morbid obesity, positive family history of coronary artery disease, status post surgical menopause, who came to the ER complaining of retrosternal chest pain off and on radiating to both arms associated with shortness of breath since yesterday morning.  She took Tums without relief. Today, the pain got worse at rate 9/10 and she decided to come to the ER.  The patient  received sublingual nitroglycerin in the ER with partial relief and was started on IV nitroglycerin.  EKG showed sinus bradycardia with nonspecific T wave changes, but troponin I was elevated which was 3.39, although, CPKs were in the normal range.  The patient denies such episodes of chest pain in the past.  She denies PND, orthopnea, leg swelling, palpitations, lightheadedness or syncope, nausea, vomiting, diaphoresis, fevers, chills or cough.  PAST MEDICAL HISTORY:  As above.  PAST SURGICAL HISTORY: 1. Hysterectomy in 1974. 2. Tubal ligation in 1973.  ALLERGIES:  No known drug allergies.  MEDICATIONS: 1. Atenolol 100 mg one tablet daily. 2. Hydrochlorothiazide one tablet daily. 3. Aspirin 325 mg daily. 4. Reserpine 0.1 mg q.d. 5. Percogesic p.r.n.  SOCIAL HISTORY:  She is divorced and has three children.  No smoking or drug abuse.  Use to drink hard liquor with one pint per week for 10+ years and quit in January 2003.  She was a Estate manager/land agent.  FAMILY HISTORY:  Positive for coronary artery disease.  PHYSICAL EXAMINATION:  GENERAL:  She was alert and oriented x3 in no acute distress.  VITAL SIGNS:  Blood pressure 158/70, pulse 58, sinus bradycardia on the monitor.  HEENT:  Conjunctivae was pink.  NECK:  Supple.  No JVD or bruit.  LUNGS:  Clear to auscultation.  CARDIAC:  S1, S2, soft with no S3 gallop.  ABDOMEN:  Soft, obese, bowel sounds present, nontender.  EXTREMITIES:  No clubbing, cyanosis, or edema.  LABORATORY DATA AND X-RAY FINDINGS:  EKG showed sinus bradycardia with  nonspecific T wave changes.  Her hemoglobin was 12, hematocrit 35.2, platelet count 209.  Potassium 4.0, BUN 13, creatinine 1.1.  Her CPKs was 121, MB 1.3; second set 102, MB 1.1; troponin I 3.39, 3.0 and 2.84.  Cholesterol 170, HDL 46, LDL 113.  TSH 1.33.  HOSPITAL COURSE:  The patient was transferred from [facility] to [facility].  She was started on IV nitrates, Ceftin  and IIB3 inhibitors. The patient had one episode of chest pain during the hospital stay.  Repeat EKG showed no acute ischemic changes.  The patient underwent left cardiac catheterization with left and right coronary angiography as per procedure report.  On May 22, the patient tolerated the procedure well with no complications.  The patient was transferred to the CCU in stable condition. Her groin is stable.  The patient has ambulated into the bathroom without any problems.  The patient did not have any further episodes of chest pain during the hospital stay.  The patient will be discharged home on the above medications and will be followed up in my office in one week.   Dictated by: Eduardo Osier Sharyn Lull, M.D. Attending Physician:  Robynn Pane DD:  07/18/01 TD:  07/21/01 Job: [id number]

## 2010-07-21 ENCOUNTER — Encounter: Payer: Self-pay | Admitting: Internal Medicine

## 2010-07-25 ENCOUNTER — Ambulatory Visit (HOSPITAL_COMMUNITY)
Admission: RE | Admit: 2010-07-25 | Discharge: 2010-07-25 | Disposition: A | Discharge status: Home / Self Care | Payer: Medicaid Other | Source: Ambulatory Visit | Attending: Internal Medicine | Admitting: Internal Medicine

## 2010-07-25 ENCOUNTER — Encounter: Payer: Self-pay | Admitting: Internal Medicine

## 2010-07-25 ENCOUNTER — Ambulatory Visit (INDEPENDENT_AMBULATORY_CARE_PROVIDER_SITE_OTHER): Payer: Medicaid Other | Admitting: Internal Medicine

## 2010-07-25 DIAGNOSIS — M79609 Pain in unspecified limb: Secondary | ICD-10-CM | POA: Insufficient documentation

## 2010-07-25 DIAGNOSIS — M19079 Primary osteoarthritis, unspecified ankle and foot: Secondary | ICD-10-CM | POA: Insufficient documentation

## 2010-07-25 DIAGNOSIS — I10 Essential (primary) hypertension: Secondary | ICD-10-CM

## 2010-07-25 DIAGNOSIS — M109 Gout, unspecified: Secondary | ICD-10-CM

## 2010-07-25 DIAGNOSIS — M2010 Hallux valgus (acquired), unspecified foot: Secondary | ICD-10-CM | POA: Insufficient documentation

## 2010-07-25 DIAGNOSIS — M79673 Pain in unspecified foot: Secondary | ICD-10-CM

## 2010-07-25 DIAGNOSIS — M1990 Unspecified osteoarthritis, unspecified site: Secondary | ICD-10-CM

## 2010-07-25 DIAGNOSIS — E785 Hyperlipidemia, unspecified: Secondary | ICD-10-CM

## 2010-07-25 DIAGNOSIS — M7730 Calcaneal spur, unspecified foot: Secondary | ICD-10-CM | POA: Insufficient documentation

## 2010-07-25 DIAGNOSIS — E669 Obesity, unspecified: Secondary | ICD-10-CM

## 2010-07-25 MED ORDER — LOSARTAN POTASSIUM 100 MG PO TABS: 100.0000 mg | ORAL_TABLET | Freq: Every day | ORAL | Status: DC

## 2010-07-25 MED ORDER — ALLOPURINOL 100 MG PO TABS: 100.0000 mg | ORAL_TABLET | Freq: Every day | ORAL | Status: DC

## 2010-07-25 NOTE — Progress Notes (Signed)
  Subjective:    Patient ID: Vanessa Palmer, female    DOB: [birth year], 60 y.o.   MRN: [id number]  HPI 1. HTN -patient does not take her medications as prescribed "because waiting for her disability check" that arrives on 07/30/2010. Patient denies any HA, visual changes, weakness, SOB or CP/swelling. 2. Bilateral foot pain to lateral aspects, worse in 5 th MT and PIP joints; 7/10 in intensity without radiculopathy; worse with bearing weight/walking, slight improvement with tramadol. Patient is out of allopurinol  But does not think that this pain is gout-related. Denies any trauma. Requests "to be referred for a second opinion."   Review of Systems  Constitutional: Negative.   Eyes: Negative for visual disturbance.  Respiratory: Negative.   Cardiovascular: Negative.   Gastrointestinal: Negative for abdominal distention.  Genitourinary: Negative for dysuria, urgency, hematuria, flank pain, vaginal bleeding, vaginal discharge, difficulty urinating, vaginal pain and pelvic pain.  Musculoskeletal: Positive for arthralgias and gait problem. Negative for back pain.  Neurological: Negative for dizziness, tremors, seizures, syncope, facial asymmetry, speech difficulty, weakness, light-headedness, numbness and headaches.  Psychiatric/Behavioral: Negative for hallucinations, behavioral problems, confusion and dysphoric mood.       Objective:   Physical Exam General: Vital signs reviewed and noted. Well-developed, well-nourished, in no acute distress; alert, appropriate and cooperative throughout examination.  Head: Normocephalic, atraumatic.  Neck: No deformities, masses, or tenderness noted.  Lungs:  Normal respiratory effort. Clear to auscultation BL without crackles or wheezes.  Heart: RRR. S1 and S2 normal without gallop, murmur, or rubs.  Abdomen:  BS normoactive. Soft, Nondistended, non-tender.  No masses or organomegaly.  Extremities: No pretibial edema. Both feet with TTP to 5th MP joints  with hyperpigmentation over joints and bony prominences; no increase in warmth. Pedal pulses 2+/4 bilaterally. No calf TTP bilatearlly.                                   Assessment & Plan:

## 2010-07-25 NOTE — Assessment & Plan Note (Signed)
Uncontrolled due to a noncompliance with her anti-HTN medications. Risks of HTN reviewed with the patient. Importance of adherence with treatment regimen was emphasized. Explained that her non-compliance is a major factor for her renal insufficiency. Patient verbalized understanding. She is to receive her disability check on 07/30/2010 and return to clinic on 4th or 5th for a BP recheck.

## 2010-07-25 NOTE — Patient Instructions (Signed)
Please, take ALL your medications as prescribed. Please, make a NURSING visit on 4th or 5th of June for a BP recheck. Please, return fasting at that time for a blood work (cholesterol check). Please, follow up with a sports medicine referral. Please, follow up with ma in 3 months or sooner. Please, call with any questiobns.

## 2010-07-25 NOTE — Assessment & Plan Note (Signed)
Patient continues to c/o bilateral foot pain that only mildly relieved with Tramadol. Suspect gout component --patient ran out of Allopurinol when notices escalation of pain. Will check Xray of both feet to evaluate  for stress frx. Strongly advised to restart allopurinol.

## 2010-08-07 ENCOUNTER — Ambulatory Visit (INDEPENDENT_AMBULATORY_CARE_PROVIDER_SITE_OTHER): Payer: Medicaid Other | Admitting: Family Medicine

## 2010-08-07 ENCOUNTER — Encounter: Payer: Self-pay | Admitting: Family Medicine

## 2010-08-07 VITALS — BP 143/87 | Ht 62.0 in | Wt 263.0 lb

## 2010-08-07 DIAGNOSIS — Q6650 Congenital pes planus, unspecified foot: Secondary | ICD-10-CM

## 2010-08-07 NOTE — Patient Instructions (Signed)
You have some gout type arthritis in your foot that is causing some of your problems.  You also have some flat feet--I have given you some temporary inserts for your shoes.  When you get a pair of tennis shoes or walking shoes, geytthem about 1/2 size bigger than usual. Make an appointment then for some permanent inserts. Tell Shawna Orleans you want an appointment for ORTHOTICS and be sure  AND BRING YOUR NEW SHOES  To the appointment. Great to see you!

## 2010-08-08 DIAGNOSIS — Q6650 Congenital pes planus, unspecified foot: Secondary | ICD-10-CM | POA: Insufficient documentation

## 2010-08-08 NOTE — Progress Notes (Signed)
  Subjective:    Patient ID: Vanessa Palmer, female    DOB: 1951/01/30, 60 y.o.   MRN: [id number]  HPI  Bilateral foot pain for more than 3 years. Recently ran out of her gout medicine and had a bit of a flair--that seems to have resolved. Chronic pain diffusely in both feet. Only has one pair of shoes (financial reasons). No foot injury or surgery  Review of Systems Pertinent review of systems: negative for fever or unusual weight change.     Objective:   Physical Exam GEN Overweight female, NAD FEET: B pes planus. Feet are broad and short with little or no transverse arch. No skin lesions or rash or corns. Mild callous on lateral plantar heel.  VASC: DP 2+ B=       Assessment & Plan:  1) B foot pain secondary to pes planus, loss of transverse arch and obersity  Hampered also by her financial situation, these are the  only shoes she apparently has: flats with open toes, no insole and no support. I fitted these shoes with a sports insole with B scaphoid and Mt pads to see if we can fix some of her mechanical difficulties. Will make her permanent custom orthotics but she needs some type of lace up shoe to put them in. We discussed.

## 2010-08-10 ENCOUNTER — Inpatient Hospital Stay (HOSPITAL_COMMUNITY): Payer: Medicaid Other

## 2010-08-10 ENCOUNTER — Ambulatory Visit (INDEPENDENT_AMBULATORY_CARE_PROVIDER_SITE_OTHER): Payer: Medicaid Other | Admitting: Internal Medicine

## 2010-08-10 ENCOUNTER — Inpatient Hospital Stay (HOSPITAL_COMMUNITY)
Admission: AD | Admit: 2010-08-10 | Discharge: 2010-08-14 | DRG: 690 | Disposition: A | Discharge status: Home / Self Care | Payer: Medicaid Other | Source: Ambulatory Visit | Attending: Internal Medicine | Admitting: Internal Medicine

## 2010-08-10 ENCOUNTER — Telehealth: Payer: Self-pay | Admitting: *Deleted

## 2010-08-10 ENCOUNTER — Encounter: Payer: Self-pay | Admitting: Internal Medicine

## 2010-08-10 VITALS — BP 106/77 | HR 76 | Temp 99.4°F | Ht 61.4 in | Wt 251.9 lb

## 2010-08-10 DIAGNOSIS — R410 Disorientation, unspecified: Secondary | ICD-10-CM | POA: Insufficient documentation

## 2010-08-10 DIAGNOSIS — I252 Old myocardial infarction: Secondary | ICD-10-CM

## 2010-08-10 DIAGNOSIS — I2510 Atherosclerotic heart disease of native coronary artery without angina pectoris: Secondary | ICD-10-CM | POA: Diagnosis present

## 2010-08-10 DIAGNOSIS — F1011 Alcohol abuse, in remission: Secondary | ICD-10-CM | POA: Diagnosis present

## 2010-08-10 DIAGNOSIS — E785 Hyperlipidemia, unspecified: Secondary | ICD-10-CM | POA: Diagnosis present

## 2010-08-10 DIAGNOSIS — J45909 Unspecified asthma, uncomplicated: Secondary | ICD-10-CM | POA: Diagnosis present

## 2010-08-10 DIAGNOSIS — G4733 Obstructive sleep apnea (adult) (pediatric): Secondary | ICD-10-CM | POA: Diagnosis present

## 2010-08-10 DIAGNOSIS — IMO0002 Reserved for concepts with insufficient information to code with codable children: Secondary | ICD-10-CM | POA: Diagnosis present

## 2010-08-10 DIAGNOSIS — N10 Acute tubulo-interstitial nephritis: Principal | ICD-10-CM | POA: Diagnosis present

## 2010-08-10 DIAGNOSIS — I129 Hypertensive chronic kidney disease with stage 1 through stage 4 chronic kidney disease, or unspecified chronic kidney disease: Secondary | ICD-10-CM | POA: Diagnosis present

## 2010-08-10 DIAGNOSIS — N179 Acute kidney failure, unspecified: Secondary | ICD-10-CM | POA: Diagnosis present

## 2010-08-10 DIAGNOSIS — A498 Other bacterial infections of unspecified site: Secondary | ICD-10-CM | POA: Diagnosis present

## 2010-08-10 DIAGNOSIS — R404 Transient alteration of awareness: Secondary | ICD-10-CM

## 2010-08-10 DIAGNOSIS — F329 Major depressive disorder, single episode, unspecified: Secondary | ICD-10-CM | POA: Diagnosis present

## 2010-08-10 DIAGNOSIS — M1710 Unilateral primary osteoarthritis, unspecified knee: Secondary | ICD-10-CM | POA: Diagnosis present

## 2010-08-10 DIAGNOSIS — R17 Unspecified jaundice: Secondary | ICD-10-CM | POA: Diagnosis present

## 2010-08-10 DIAGNOSIS — K219 Gastro-esophageal reflux disease without esophagitis: Secondary | ICD-10-CM | POA: Diagnosis present

## 2010-08-10 DIAGNOSIS — N390 Urinary tract infection, site not specified: Secondary | ICD-10-CM

## 2010-08-10 DIAGNOSIS — M109 Gout, unspecified: Secondary | ICD-10-CM | POA: Diagnosis present

## 2010-08-10 DIAGNOSIS — N189 Chronic kidney disease, unspecified: Secondary | ICD-10-CM | POA: Diagnosis present

## 2010-08-10 LAB — POCT URINALYSIS DIPSTICK
Nitrite, UA: POSITIVE
Protein, UA: >=300
pH, UA: 5

## 2010-08-10 LAB — DIFFERENTIAL
Eosinophils Absolute: 0 10*3/uL (ref 0.0–0.7)
Eosinophils Relative: 0 % (ref 0–5)
Lymphocytes Relative: 19 % (ref 12–46)
Lymphs Abs: 1.3 10*3/uL (ref 0.7–4.0)
Monocytes Absolute: 0.9 10*3/uL (ref 0.1–1.0)
Monocytes Relative: 12 % (ref 3–12)

## 2010-08-10 LAB — COMPREHENSIVE METABOLIC PANEL
Alkaline Phosphatase: 108 U/L (ref 39–117)
BUN: 32 mg/dL — ABNORMAL HIGH (ref 6–23)
Chloride: 94 mEq/L — ABNORMAL LOW (ref 96–112)
GFR calc Af Amer: 18 mL/min — ABNORMAL LOW (ref >60)
Glucose, Bld: 133 mg/dL — ABNORMAL HIGH (ref 70–99)
Sodium: 132 mEq/L — ABNORMAL LOW (ref 135–145)
Total Bilirubin: 1.5 mg/dL — ABNORMAL HIGH (ref 0.3–1.2)
Total Protein: 7.9 g/dL (ref 6.0–8.3)

## 2010-08-10 LAB — CARDIAC PANEL(CRET KIN+CKTOT+MB+TROPI)
CK, MB: 2.6 ng/mL (ref 0.3–4.0)
Relative Index: 1.1 (ref 0.0–2.5)
Troponin I: <0.3 ng/mL (ref <0.30)

## 2010-08-10 LAB — HEMOGLOBIN A1C
Hgb A1c MFr Bld: 6 % — ABNORMAL HIGH (ref <5.7)
Mean Plasma Glucose: 126 mg/dL — ABNORMAL HIGH (ref <117)

## 2010-08-10 LAB — CBC
HCT: 34.8 % — ABNORMAL LOW (ref 36.0–46.0)
Hemoglobin: 11.6 g/dL — ABNORMAL LOW (ref 12.0–15.0)
MCH: 28.5 pg (ref 26.0–34.0)
MCV: 85.5 fL (ref 78.0–100.0)
Platelets: 181 10*3/uL (ref 150–400)
RBC: 4.07 MIL/uL (ref 3.87–5.11)
WBC: 6.9 10*3/uL (ref 4.0–10.5)

## 2010-08-10 LAB — LIPID PANEL
LDL Cholesterol: 106 mg/dL — ABNORMAL HIGH (ref 0–99)
Total CHOL/HDL Ratio: 6.9 RATIO
VLDL: 23 mg/dL (ref 0–40)

## 2010-08-10 LAB — TSH: TSH: 1.147 u[IU]/mL (ref 0.350–4.500)

## 2010-08-10 NOTE — Progress Notes (Signed)
Hospital Admission Note Date: 08/10/2010  Patient name: BRANDY ZUBA Medical record number: [id number] Date of birth: [birth year] Age: 60 y.o. Gender: female PCP: Deatra Robinson, MD  Medical Service:  Attending physician:   Dr. Margarito Liner Resident [phone]):   Dr. Scot Dock [phone] Resident (R1):     Chief Complaint: abdominal pain, AMS  History of Present Illness: Patient is 60 year old female with complicated past medical history outlined below who presents as well as outpatient clinic with main concern of persistent lower abdominal pain, pain started approximately 4 days ago and has been getting progressively worse. Patient describes pain as sharp and mostly located in lower quadrant occasionally radiating to bilateral upper quadrant, associated with nausea and vomiting, subjective fevers and chills, intermittent nonbloody diarrhea. Patient denies similar episodes in the past. Her current pain is also associated with dysuria, urinary frequency, urinary urgency. In addition she reports uncomfortable substernal, pressure-like chest pain also approximately 4 days in duration and continuous. Pain is aggravated by nausea and vomiting, nonproductive cough. In addition patient reports generalized malaise and weakness. Also reports decreased appetite, generalized headaches. She denies recent sick contacts or exposures, no recent travels. He reports compliance with current medication.  Current Outpatient Prescriptions  Medication Sig Dispense Refill  . allopurinol (ZYLOPRIM) 100 MG tablet Take 1 tablet (100 mg total) by mouth daily.  30 tablet  2  . aspirin 81 MG tablet Take 81 mg by mouth daily.        . carvedilol (COREG) 25 MG tablet Take 25 mg by mouth 2 (two) times daily with a meal.        . citalopram (CELEXA) 40 MG tablet Take 40 mg by mouth daily.        . diazepam (VALIUM) 5 MG tablet Take 5 mg by mouth every 6 (six) hours as needed.        . diltiazem (CARDIZEM SR) 120 MG 12 hr capsule  Take 1 capsule (120 mg total) by mouth 2 (two) times daily.  60 capsule  11  . fluticasone (FLOVENT HFA) 44 MCG/ACT inhaler Inhale 1 puff into the lungs 2 (two) times daily.        Marland Kitchen gabapentin (NEURONTIN) 300 MG capsule Take 300 mg by mouth 3 (three) times daily.        Marland Kitchen lamoTRIgine (LAMICTAL) 100 MG tablet Take 100 mg by mouth daily.        Marland Kitchen losartan (COZAAR) 100 MG tablet Take 1 tablet (100 mg total) by mouth daily.  30 tablet  11  . nitroGLYCERIN (NITROSTAT) 0.3 MG SL tablet Place 0.3 mg under the tongue every 5 (five) minutes as needed.        Marland Kitchen omeprazole (PRILOSEC) 40 MG capsule Take 40 mg by mouth daily.        . pentosan polysulfate (ELMIRON) 100 MG capsule Take 100 mg by mouth 3 (three) times daily before meals.        . solifenacin (VESICARE) 10 MG tablet Take 5 mg by mouth daily.        . traMADol (ULTRAM) 50 MG tablet Take 50 mg by mouth every 6 (six) hours as needed.        . traZODone (DESYREL) 100 MG tablet Take 100 mg by mouth at bedtime.          Allergies: Ace inhibitors; Hydromorphone hcl; and Propoxyphene n-acetaminophen  PMH: 1. Hypertension 2. Depression 3. chronic pain 4. Neuropathy 5. gout  History   Social History  . Marital Status:  Single    Social History Main Topics  . Smoking status: Never Smoker    Review of Systems: Gen.: Generalized weakness, no recent travel or sick contacts in exposure HEENT: No sore throat, no sinus tenderness, no mouth pain Cardiovascular: Chest pain as noted in history of present illness, no orthopnea or palpitations Respiratory: Dyspnea on exertion and rest for approximately 2 weeks, nonproductive cough as described in history of present illness, no hemoptysis Abdomen: Nausea and vomiting as noted above, no dysphasia or odynophagia, no hematemesis or melena GU: Dysuria with urinary frequency and urgency present as per history of present illness, no hematuria Neuro: Generalized weakness, no numbness or tingling, no visual  changes  Physical Exam: T =  BP =  HR =  R = O2 sat  VItal signs reviewed. Blood pressure is slightly hypotensive  GEN: Patient appears in moderate distress; she is occasionally tearful and is lethargic. Alert and oriented to person, place, month but not date. She is often unable or unwilling to respond to questions.  HEENT: head is autraumatic and normocephalic. Neck is supple without palpable masses or lymphadenopathy. EOMI. Mucous membranes are dry. Oropharynx is without erythema, exudates, or other abnormal lesions. Dentition is poor with numerous teeth missing.  RESP: Lungs are clear to ascultation bilaterally with good air movement; breath sounds are diffusely decreased likely secondary to patient's habitus No wheezes, ronchi, or rubs.  CARDIOVASCULAR: regular rate, normal rhythm. Distant but clear S1, S2, no murmurs, gallops, or rubs.  ABDOMEN: soft, diffusely tender to palpation; tenderness is greatest in the suprapubic area, non-distended. Bowels sounds present in all quadrants and normoactive. No palpable masses.  EXT: warm and dry. No clubbing or cyanosis.  SKIN: No rashes or abnormal lesions observed.  GU: Suprapubic tenderness noted. Right CVA tenderness.  NEURO: CN II-XII grossly intact. Patient has difficulty with movement secondary to pain/delirium; full neurological exam unable to be performed at this time.  Lab results: Pending  Imaging results:  Pending  Assessment & Plan by Problem:.  1) AMS with abdominal pain, nausea and vomiting - urine dipstick significant for present nitrates, no detail urinalysis result available yet. Patient symptoms noted in history of present illness with fevers most likely secondary to pyelonephritis. Other etiologies must be ruled out considering altered mental status. Main concern is TIA, stroke however physical exam was not significant for gross neurologic findings. Other etiologies include cardiac, pancreatic and liver. Plan - Admit  patient to inpatient, step down unit and monitor vitals - Frequent neurologic checks every 4 hours - Supportive care with IV fluids, ensuring to hold off on any sedating medications in order to get a clear picture with mental status change - Obtain following labs: Cardiac enzymes x3, first set now and cycle every 8 hours, blood cultures x2, urinalysis and urine culture, TSH, UDS, alcohol level - Diagnostic tests: PA and lateral chest x-ray, CT of the head without contrast  - Follow strict urine input and output and keep patient n.p.o. for now, may need bedside swallow evaluation past  2) hypo-tension - patient has history of hypertension however her blood pressure slightly low upon arrival to outpatient clinic Plan - Initiate IV fluids and monitor vitals per floor protocol - Hold off on home blood pressure medications and the blood pressure stabilizes  3) DVT prophylaxis - heparin 5000 units subcutaneous every 8 hours  [employer]

## 2010-08-10 NOTE — Progress Notes (Signed)
  Subjective:    Patient ID: Vanessa Palmer, female    DOB: [birth year], 60 y.o.   MRN: [id number]  HPI  patient is a 60 year old female who is presenting today for followup of her blood pressure.  Pt states she doesn't feel good.  She reports a sensation of burning with urination for the past few days as well as hesitancy. She denies frequency and urgency.  She states she has had a UTI previously with similar symptoms.  She admits to fevers and sweating; she has not taken her temp at home.  She reports "pain all over" especially in her abdomen.  He also reports bilateral low back pain for 4 days prior to her office visit today. She admits to nausea but denies vomiting.   She has significant difficulty answering questions and providing a detailed history of her current symptoms.  She often does not respond to questions.  She is lethargic and at times seems confused.   Review of Systems Full review of systems not able to be obtained secondary to patient's altered metal status.     Objective:   Physical Exam VItal signs reviewed. Blood pressure is slightly hypotensive GEN: Patient appears in moderate distress; she is occasionally tearful and is lethargic.  Alert and oriented to person, place, month but not date.  She is often unable or unwilling to respond to questions. HEENT: head is autraumatic and normocephalic.  Neck is supple without palpable masses or lymphadenopathy.  EOMI.   Mucous membranes are dry.  Oropharynx is without erythema, exudates, or other abnormal lesions.  Dentition is poor with numerous teeth missing. RESP:  Lungs are clear to ascultation bilaterally with good air movement; breath sounds are diffusely decreased likely secondary to patient's habitus  No wheezes, ronchi, or rubs. CARDIOVASCULAR: regular rate, normal rhythm.   Distant but clear S1, S2, no murmurs, gallops, or rubs. ABDOMEN: soft, diffusely tender to palpation; tenderness is greatest in the suprapubic area,  non-distended.  Bowels sounds present in all quadrants and normoactive.  No palpable masses. EXT: warm and dry.   No clubbing or cyanosis.   SKIN:   No rashes or abnormal lesions observed. GU:  Suprapubic tenderness noted. Right CVA tenderness. NEURO: CN II-XII grossly intact.  Patient has difficulty with movement secondary to pain/delirium; full neurological exam unable to be performed at this time.        Assessment & Plan:

## 2010-08-10 NOTE — Progress Notes (Signed)
Report called to Mathis Fare, RN on [id number].  Pt transported vis wheel chair to [id number].  Angelina Ok, RN 08/10/2010 1:54 PM

## 2010-08-10 NOTE — Progress Notes (Signed)
Addended by: Bufford Spikes on: 08/10/2010 11:41 AM   Modules accepted: Orders

## 2010-08-10 NOTE — Telephone Encounter (Signed)
Called [phone] - denied Losartan Potassium 100 mg - wants pt to try ace inhibitor first - any of the " prils" .  Flag sent to Dr Denton Meek. Stanton Kidney Erickson Yamashiro RN  08/10/10 4:20PM

## 2010-08-10 NOTE — Assessment & Plan Note (Signed)
Patient clearly has mental status changes. This seems most likely to be secondary to urinary tract infection. At this time I do not feel it is safe for her to go home and be treated as an outpatient for her urinary tract infection therefore will admit her to the hospital for further evaluation and treatment. Will check a comprehensive metabolic panel, CBC with differential, blood cultures, and will send urine for culture. Further workup will be deferred to the admitting inpatient team.

## 2010-08-10 NOTE — Progress Notes (Signed)
Hospital Admission Note Date: 08/10/2010  Patient name: Vanessa Palmer Medical record number: [id number] Date of birth: 11/11/1950 Age: 59 y.o. Gender: female PCP: KARIMOVA,NODIRA, MD  Medical Service:  Attending physician:   Dr. Jerry Joines Resident (R2/R3):   Dr. Devani [phone] Resident (R1):     Chief Complaint: abdominal pain, AMS  History of Present Illness: Patient is 59-year-old female with complicated past medical history outlined below who presents as well as outpatient clinic with main concern of persistent lower abdominal pain, pain started approximately 4 days ago and has been getting progressively worse. Patient describes pain as sharp and mostly located in lower quadrant occasionally radiating to bilateral upper quadrant, associated with nausea and vomiting, subjective fevers and chills, intermittent nonbloody diarrhea. Patient denies similar episodes in the past. Her current pain is also associated with dysuria, urinary frequency, urinary urgency. In addition she reports uncomfortable substernal, pressure-like chest pain also approximately 4 days in duration and continuous. Pain is aggravated by nausea and vomiting, nonproductive cough. In addition patient reports generalized malaise and weakness. Also reports decreased appetite, generalized headaches. She denies recent sick contacts or exposures, no recent travels. He reports compliance with current medication.  Current Outpatient Prescriptions  Medication Sig Dispense Refill  . allopurinol (ZYLOPRIM) 100 MG tablet Take 1 tablet (100 mg total) by mouth daily.  30 tablet  2  . aspirin 81 MG tablet Take 81 mg by mouth daily.        . carvedilol (COREG) 25 MG tablet Take 25 mg by mouth 2 (two) times daily with a meal.        . citalopram (CELEXA) 40 MG tablet Take 40 mg by mouth daily.        . diazepam (VALIUM) 5 MG tablet Take 5 mg by mouth every 6 (six) hours as needed.        . diltiazem (CARDIZEM SR) 120 MG 12 hr capsule  Take 1 capsule (120 mg total) by mouth 2 (two) times daily.  60 capsule  11  . fluticasone (FLOVENT HFA) 44 MCG/ACT inhaler Inhale 1 puff into the lungs 2 (two) times daily.        . gabapentin (NEURONTIN) 300 MG capsule Take 300 mg by mouth 3 (three) times daily.        . lamoTRIgine (LAMICTAL) 100 MG tablet Take 100 mg by mouth daily.        . losartan (COZAAR) 100 MG tablet Take 1 tablet (100 mg total) by mouth daily.  30 tablet  11  . nitroGLYCERIN (NITROSTAT) 0.3 MG SL tablet Place 0.3 mg under the tongue every 5 (five) minutes as needed.        . omeprazole (PRILOSEC) 40 MG capsule Take 40 mg by mouth daily.        . pentosan polysulfate (ELMIRON) 100 MG capsule Take 100 mg by mouth 3 (three) times daily before meals.        . solifenacin (VESICARE) 10 MG tablet Take 5 mg by mouth daily.        . traMADol (ULTRAM) 50 MG tablet Take 50 mg by mouth every 6 (six) hours as needed.        . traZODone (DESYREL) 100 MG tablet Take 100 mg by mouth at bedtime.          Allergies: Ace inhibitors; Hydromorphone hcl; and Propoxyphene n-acetaminophen  PMH: 1. Hypertension 2. Depression 3. chronic pain 4. Neuropathy 5. gout  History   Social History  . Marital Status:   Single    Social History Main Topics  . Smoking status: Never Smoker    Review of Systems: Gen.: Generalized weakness, no recent travel or sick contacts in exposure HEENT: No sore throat, no sinus tenderness, no mouth pain Cardiovascular: Chest pain as noted in history of present illness, no orthopnea or palpitations Respiratory: Dyspnea on exertion and rest for approximately 2 weeks, nonproductive cough as described in history of present illness, no hemoptysis Abdomen: Nausea and vomiting as noted above, no dysphasia or odynophagia, no hematemesis or melena GU: Dysuria with urinary frequency and urgency present as per history of present illness, no hematuria Neuro: Generalized weakness, no numbness or tingling, no visual  changes  Physical Exam: T =  BP =  HR =  R = O2 sat  VItal signs reviewed. Blood pressure is slightly hypotensive  GEN: Patient appears in moderate distress; she is occasionally tearful and is lethargic. Alert and oriented to person, place, month but not date. She is often unable or unwilling to respond to questions.  HEENT: head is autraumatic and normocephalic. Neck is supple without palpable masses or lymphadenopathy. EOMI. Mucous membranes are dry. Oropharynx is without erythema, exudates, or other abnormal lesions. Dentition is poor with numerous teeth missing.  RESP: Lungs are clear to ascultation bilaterally with good air movement; breath sounds are diffusely decreased likely secondary to patient's habitus No wheezes, ronchi, or rubs.  CARDIOVASCULAR: regular rate, normal rhythm. Distant but clear S1, S2, no murmurs, gallops, or rubs.  ABDOMEN: soft, diffusely tender to palpation; tenderness is greatest in the suprapubic area, non-distended. Bowels sounds present in all quadrants and normoactive. No palpable masses.  EXT: warm and dry. No clubbing or cyanosis.  SKIN: No rashes or abnormal lesions observed.  GU: Suprapubic tenderness noted. Right CVA tenderness.  NEURO: CN II-XII grossly intact. Patient has difficulty with movement secondary to pain/delirium; full neurological exam unable to be performed at this time.  Lab results: Pending  Imaging results:  Pending  Assessment & Plan by Problem:.  1) AMS with abdominal pain, nausea and vomiting - urine dipstick significant for present nitrates, no detail urinalysis result available yet. Patient symptoms noted in history of present illness with fevers most likely secondary to pyelonephritis. Other etiologies must be ruled out considering altered mental status. Main concern is TIA, stroke however physical exam was not significant for gross neurologic findings. Other etiologies include cardiac, pancreatic and liver. Plan - Admit  patient to inpatient, step down unit and monitor vitals - Frequent neurologic checks every 4 hours - Supportive care with IV fluids, ensuring to hold off on any sedating medications in order to get a clear picture with mental status change - Obtain following labs: Cardiac enzymes x3, first set now and cycle every 8 hours, blood cultures x2, urinalysis and urine culture, TSH, UDS, alcohol level - Diagnostic tests: PA and lateral chest x-ray, CT of the head without contrast  - Follow strict urine input and output and keep patient n.p.o. for now, may need bedside swallow evaluation past  2) hypo-tension - patient has history of hypertension however her blood pressure slightly low upon arrival to outpatient clinic Plan - Initiate IV fluids and monitor vitals per floor protocol - Hold off on home blood pressure medications and the blood pressure stabilizes  3) DVT prophylaxis - heparin 5000 units subcutaneous every 8 hours  Nyquan Selbe Magick     

## 2010-08-10 NOTE — Assessment & Plan Note (Signed)
Patient's history, exam findings, and urinalysis are consistent with urinary tract infection. She is slightly hypotensive but does not have any other signs concerning for SIRS/sepsis.  Will check a CBC with differential to evaluate for leukocytosis or leukopenia Will obtain blood cultures to evaluate for bacteremia she will be admitted to the hospital for further evaluation and treatment as outlined in assessment and plan for acute delirium.  In view of the electronic medical record reveals previous urinary tract infections with Escherichia coli that is sensitive to fluoroquinolones. Will treat empirically with IV Cipro renally adjusted to 400 mg IV daily as patient's GFR is less than 30; L. confirm appropriate dosing with pharmacy.

## 2010-08-11 ENCOUNTER — Inpatient Hospital Stay (HOSPITAL_COMMUNITY): Payer: Medicaid Other

## 2010-08-11 LAB — BASIC METABOLIC PANEL
CO2: 27 mEq/L (ref 19–32)
Chloride: 96 mEq/L (ref 96–112)
Glucose, Bld: 109 mg/dL — ABNORMAL HIGH (ref 70–99)
Potassium: 4 mEq/L (ref 3.5–5.1)
Sodium: 133 mEq/L — ABNORMAL LOW (ref 135–145)

## 2010-08-11 LAB — COMPREHENSIVE METABOLIC PANEL
ALT: 33 U/L (ref 0–35)
Alkaline Phosphatase: 105 U/L (ref 39–117)
CO2: 25 mEq/L (ref 19–32)
Calcium: 8.5 mg/dL (ref 8.4–10.5)
GFR calc Af Amer: 23 mL/min — ABNORMAL LOW (ref >60)
GFR calc non Af Amer: 19 mL/min — ABNORMAL LOW (ref >60)
Glucose, Bld: 114 mg/dL — ABNORMAL HIGH (ref 70–99)
Sodium: 134 mEq/L — ABNORMAL LOW (ref 135–145)
Total Bilirubin: 1.9 mg/dL — ABNORMAL HIGH (ref 0.3–1.2)

## 2010-08-11 LAB — URINE MICROSCOPIC-ADD ON

## 2010-08-11 LAB — CARDIAC PANEL(CRET KIN+CKTOT+MB+TROPI)
CK, MB: 1.9 ng/mL (ref 0.3–4.0)
Total CK: 197 U/L — ABNORMAL HIGH (ref 7–177)
Troponin I: <0.3 ng/mL (ref <0.30)

## 2010-08-11 LAB — CBC
HCT: 34.6 % — ABNORMAL LOW (ref 36.0–46.0)
Hemoglobin: 11.8 g/dL — ABNORMAL LOW (ref 12.0–15.0)
MCH: 28.9 pg (ref 26.0–34.0)
MCHC: 34.1 g/dL (ref 30.0–36.0)

## 2010-08-11 LAB — DRUGS OF ABUSE SCREEN W/O ALC, ROUTINE URINE
Amphetamine Screen, Ur: NEGATIVE
Barbiturate Quant, Ur: NEGATIVE
Benzodiazepines.: POSITIVE — AB
Phencyclidine (PCP): NEGATIVE

## 2010-08-11 LAB — URINALYSIS, ROUTINE W REFLEX MICROSCOPIC
Glucose, UA: NEGATIVE mg/dL
Specific Gravity, Urine: 1.011 (ref 1.005–1.030)
Urobilinogen, UA: >8 mg/dL — ABNORMAL HIGH (ref 0.0–1.0)

## 2010-08-12 LAB — BILIRUBIN, FRACTIONATED(TOT/DIR/INDIR): Total Bilirubin: 1.8 mg/dL — ABNORMAL HIGH (ref 0.3–1.2)

## 2010-08-12 LAB — BASIC METABOLIC PANEL
BUN: 32 mg/dL — ABNORMAL HIGH (ref 6–23)
CO2: 23 mEq/L (ref 19–32)
Calcium: 8.7 mg/dL (ref 8.4–10.5)
GFR calc non Af Amer: 29 mL/min — ABNORMAL LOW (ref >60)
Glucose, Bld: 103 mg/dL — ABNORMAL HIGH (ref 70–99)

## 2010-08-12 LAB — URINE CULTURE
Colony Count: >=100000
Culture  Setup Time: 201206141312

## 2010-08-12 LAB — CARDIAC PANEL(CRET KIN+CKTOT+MB+TROPI)
CK, MB: 1.6 ng/mL (ref 0.3–4.0)
CK, MB: 1.7 ng/mL (ref 0.3–4.0)
Relative Index: 1.2 (ref 0.0–2.5)
Total CK: 132 U/L (ref 7–177)
Troponin I: <0.3 ng/mL (ref <0.30)
Troponin I: <0.3 ng/mL (ref <0.30)

## 2010-08-13 LAB — HEPATITIS PANEL, ACUTE
HCV Ab: NEGATIVE
Hep A IgM: NEGATIVE
Hepatitis B Surface Ag: NEGATIVE

## 2010-08-13 LAB — CBC
HCT: 32.2 % — ABNORMAL LOW (ref 36.0–46.0)
MCH: 27.9 pg (ref 26.0–34.0)
MCV: 82.6 fL (ref 78.0–100.0)
Platelets: 202 10*3/uL (ref 150–400)
RBC: 3.9 MIL/uL (ref 3.87–5.11)
WBC: 5 10*3/uL (ref 4.0–10.5)

## 2010-08-13 LAB — BASIC METABOLIC PANEL
CO2: 24 mEq/L (ref 19–32)
Chloride: 103 mEq/L (ref 96–112)
Creatinine, Ser: 1.39 mg/dL — ABNORMAL HIGH (ref 0.50–1.10)
GFR calc Af Amer: 47 mL/min — ABNORMAL LOW (ref >60)
Potassium: 3.9 mEq/L (ref 3.5–5.1)
Sodium: 139 mEq/L (ref 135–145)

## 2010-08-13 LAB — CARDIAC PANEL(CRET KIN+CKTOT+MB+TROPI)
CK, MB: 1.7 ng/mL (ref 0.3–4.0)
Relative Index: INVALID (ref 0.0–2.5)
Relative Index: INVALID (ref 0.0–2.5)
Total CK: 73 U/L (ref 7–177)
Troponin I: <0.3 ng/mL (ref <0.30)
Troponin I: <0.3 ng/mL (ref <0.30)

## 2010-08-14 DIAGNOSIS — N390 Urinary tract infection, site not specified: Secondary | ICD-10-CM

## 2010-08-14 DIAGNOSIS — R404 Transient alteration of awareness: Secondary | ICD-10-CM

## 2010-08-14 LAB — CBC
HCT: 33.4 % — ABNORMAL LOW (ref 36.0–46.0)
Hemoglobin: 11.7 g/dL — ABNORMAL LOW (ref 12.0–15.0)
MCH: 29 pg (ref 26.0–34.0)
MCHC: 35 g/dL (ref 30.0–36.0)
MCV: 82.7 fL (ref 78.0–100.0)
RDW: 13.6 % (ref 11.5–15.5)

## 2010-08-14 LAB — CULTURE, BLOOD (SINGLE): Culture  Setup Time: 201206141916

## 2010-08-14 LAB — CARDIAC PANEL(CRET KIN+CKTOT+MB+TROPI)
CK, MB: 1.9 ng/mL (ref 0.3–4.0)
CK, MB: 1.9 ng/mL (ref 0.3–4.0)
Relative Index: INVALID (ref 0.0–2.5)
Total CK: 64 U/L (ref 7–177)
Total CK: 67 U/L (ref 7–177)
Troponin I: <0.3 ng/mL (ref <0.30)

## 2010-08-14 LAB — BASIC METABOLIC PANEL
BUN: 14 mg/dL (ref 6–23)
Chloride: 104 mEq/L (ref 96–112)
GFR calc non Af Amer: 49 mL/min — ABNORMAL LOW (ref >60)
Glucose, Bld: 107 mg/dL — ABNORMAL HIGH (ref 70–99)
Potassium: 3.9 mEq/L (ref 3.5–5.1)
Sodium: 140 mEq/L (ref 135–145)

## 2010-08-15 ENCOUNTER — Inpatient Hospital Stay (HOSPITAL_COMMUNITY)
Admission: EM | Admit: 2010-08-15 | Discharge: 2010-08-19 | DRG: 690 | Disposition: A | Discharge status: Home / Self Care | Payer: Medicaid Other | Attending: Internal Medicine | Admitting: Internal Medicine

## 2010-08-15 ENCOUNTER — Encounter: Payer: Self-pay | Admitting: Internal Medicine

## 2010-08-15 DIAGNOSIS — N3941 Urge incontinence: Secondary | ICD-10-CM | POA: Diagnosis present

## 2010-08-15 DIAGNOSIS — N189 Chronic kidney disease, unspecified: Secondary | ICD-10-CM | POA: Diagnosis present

## 2010-08-15 DIAGNOSIS — J45909 Unspecified asthma, uncomplicated: Secondary | ICD-10-CM | POA: Diagnosis present

## 2010-08-15 DIAGNOSIS — I252 Old myocardial infarction: Secondary | ICD-10-CM

## 2010-08-15 DIAGNOSIS — R7402 Elevation of levels of lactic acid dehydrogenase (LDH): Secondary | ICD-10-CM | POA: Diagnosis present

## 2010-08-15 DIAGNOSIS — IMO0002 Reserved for concepts with insufficient information to code with codable children: Secondary | ICD-10-CM | POA: Diagnosis present

## 2010-08-15 DIAGNOSIS — I2510 Atherosclerotic heart disease of native coronary artery without angina pectoris: Secondary | ICD-10-CM | POA: Diagnosis present

## 2010-08-15 DIAGNOSIS — M109 Gout, unspecified: Secondary | ICD-10-CM | POA: Diagnosis present

## 2010-08-15 DIAGNOSIS — F1011 Alcohol abuse, in remission: Secondary | ICD-10-CM | POA: Diagnosis present

## 2010-08-15 DIAGNOSIS — F331 Major depressive disorder, recurrent, moderate: Secondary | ICD-10-CM | POA: Diagnosis present

## 2010-08-15 DIAGNOSIS — I129 Hypertensive chronic kidney disease with stage 1 through stage 4 chronic kidney disease, or unspecified chronic kidney disease: Secondary | ICD-10-CM | POA: Diagnosis present

## 2010-08-15 DIAGNOSIS — R7401 Elevation of levels of liver transaminase levels: Secondary | ICD-10-CM | POA: Diagnosis present

## 2010-08-15 DIAGNOSIS — E785 Hyperlipidemia, unspecified: Secondary | ICD-10-CM | POA: Diagnosis present

## 2010-08-15 DIAGNOSIS — G609 Hereditary and idiopathic neuropathy, unspecified: Secondary | ICD-10-CM | POA: Diagnosis present

## 2010-08-15 DIAGNOSIS — G4733 Obstructive sleep apnea (adult) (pediatric): Secondary | ICD-10-CM | POA: Diagnosis present

## 2010-08-15 DIAGNOSIS — N10 Acute tubulo-interstitial nephritis: Principal | ICD-10-CM | POA: Diagnosis present

## 2010-08-15 DIAGNOSIS — M1710 Unilateral primary osteoarthritis, unspecified knee: Secondary | ICD-10-CM | POA: Diagnosis present

## 2010-08-15 LAB — CBC
Platelets: 303 10*3/uL (ref 150–400)
RBC: 4.34 MIL/uL (ref 3.87–5.11)
RDW: 13.9 % (ref 11.5–15.5)
WBC: 6.1 10*3/uL (ref 4.0–10.5)

## 2010-08-15 LAB — DIFFERENTIAL
Basophils Absolute: 0.1 10*3/uL (ref 0.0–0.1)
Eosinophils Relative: 1 % (ref 0–5)
Lymphocytes Relative: 31 % (ref 12–46)
Monocytes Relative: 8 % (ref 3–12)

## 2010-08-15 LAB — COMPREHENSIVE METABOLIC PANEL
ALT: 88 U/L — ABNORMAL HIGH (ref 0–35)
AST: 94 U/L — ABNORMAL HIGH (ref 0–37)
Albumin: 3.1 g/dL — ABNORMAL LOW (ref 3.5–5.2)
Alkaline Phosphatase: 90 U/L (ref 39–117)
Potassium: 3.5 mEq/L (ref 3.5–5.1)
Sodium: 141 mEq/L (ref 135–145)
Total Protein: 8.2 g/dL (ref 6.0–8.3)

## 2010-08-15 LAB — BENZODIAZEPINE, QUANTITATIVE, URINE
Lorazepam UR QT: NEGATIVE NG/ML
Oxazepam GC/MS Conf: NEGATIVE NG/ML
Temazepam GC/MS Conf: NEGATIVE NG/ML

## 2010-08-15 LAB — URINALYSIS, ROUTINE W REFLEX MICROSCOPIC
Glucose, UA: NEGATIVE mg/dL
Ketones, ur: 15 mg/dL — AB
pH: 6 (ref 5.0–8.0)

## 2010-08-15 LAB — GLUCOSE, CAPILLARY: Glucose-Capillary: 99 mg/dL (ref 70–99)

## 2010-08-15 NOTE — Progress Notes (Signed)
Hospital Admission Note Date: 08/15/2010  Patient name: Vanessa Palmer Medical record number: [id number] Date of birth: [birth year] Age: 60 y.o. Gender: female PCP: Deatra Robinson, MD   Attending physician:  Dr. Margarito Liner  Resident [phone]):      Dr. Scot Dock [phone]  Resident (R1):    Chief Complaint: Nausea and Vomiting  History of Present Illness: Patient is 60 year old female with complicated past medical history outlined below who was recently discharged on 6/18 from our service with resolving pyelonephritis. However the patient failed to fill in her antibiotic prescriptions and today she returns to [facility] with persistent nausea and vomiting.  She states that she has been unable to tolerate any liquids or solids since her discharge.  She also continues to have lower abdominal pain that sometimes radiates to her back as well as lightheadedness when she stands and walks.  She states she has not been urinating as much but it does burn occasionally when she does.  She denies fevers, chest pain, frequency, diarrhea, or constipation.   Current Outpatient Prescriptions  Medication Sig Dispense Refill  . allopurinol (ZYLOPRIM) 100 MG tablet Take 1 tablet (100 mg total) by mouth daily.  30 tablet  2  . aspirin 81 MG tablet Take 81 mg by mouth daily.        . carvedilol (COREG) 25 MG tablet Take 25 mg by mouth 2 (two) times daily with a meal.        . citalopram (CELEXA) 40 MG tablet Take 40 mg by mouth daily.        . diazepam (VALIUM) 5 MG tablet Take 5 mg by mouth every 6 (six) hours as needed.        . diltiazem (CARDIZEM SR) 120 MG 12 hr capsule Take 1 capsule (120 mg total) by mouth 2 (two) times daily.  60 capsule  11  . fluticasone (FLOVENT HFA) 44 MCG/ACT inhaler Inhale 1 puff into the lungs 2 (two) times daily.        Marland Kitchen gabapentin (NEURONTIN) 300 MG capsule Take 300 mg by mouth 3 (three) times daily.        Marland Kitchen lamoTRIgine (LAMICTAL) 100 MG tablet Take 100 mg by mouth daily.        Marland Kitchen  losartan (COZAAR) 100 MG tablet Take 1 tablet (100 mg total) by mouth daily.  30 tablet  11  . nitroGLYCERIN (NITROSTAT) 0.3 MG SL tablet Place 0.3 mg under the tongue every 5 (five) minutes as needed.        Marland Kitchen omeprazole (PRILOSEC) 40 MG capsule Take 40 mg by mouth daily.        . pentosan polysulfate (ELMIRON) 100 MG capsule Take 100 mg by mouth 3 (three) times daily before meals.        . solifenacin (VESICARE) 10 MG tablet Take 5 mg by mouth daily.        . traMADol (ULTRAM) 50 MG tablet Take 50 mg by mouth every 6 (six) hours as needed.        . traZODone (DESYREL) 100 MG tablet Take 100 mg by mouth at bedtime.          Allergies: Ace inhibitors; Hydromorphone hcl; and Propoxyphene n-acetaminophen  Patient Active Problem List  Diagnoses  . HYPERLIPIDEMIA  . GOUT  . MORBID OBESITY  . MAJOR DPRSV DISORDER RECURRENT EPISODE MODERATE  . ALCOHOL ABUSE  . SLEEP APNEA, OBSTRUCTIVE  . HYPERTENSION  . CORONARY ARTERY DISEASE  . INTRINSIC ASTHMA, WITH EXACERBATION  .  DEGENERATIVE JOINT DISEASE  . PAIN IN JOINT, MULTIPLE SITES  . PEDAL EDEMA  . HEADACHE  . PELVIC PAIN, CHRONIC  . ALKALINE PHOSPHATASE, ELEVATED  . HYPERGLYCEMIA  . FALL, HX OF  . Pes planus, congenital  . Delirium, acute  . Acute UTI   SOCIAL HISTORY: No smoking,  quit etoh this year, relapsed, reports sober 07/19/08 no illegal drugs.   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   FAMILY HISTORY: Father died of unknown cause  Mother died with alzheimer's Siblings with DM, no known CAD   Review of Systems: Negative except per HPI  Physical Exam: Vitals: T= 98.0,  BP=178/96, HR=58, RR= 20 , O2 Sat= 99% on RA. General:  alert, well-developed, and cooperative to examination.   Head:  normocephalic and atraumatic.   Eyes:  vision grossly intact, pupils equal, pupils round, pupils reactive to light, no injection and anicteric.   Mouth:  pharynx pink and moist, no erythema, and no exudates.  poor dentition  Neck:   supple, full ROM, no thyromegaly, no JVD, and no carotid bruits.   Lungs:  normal respiratory effort, no accessory muscle use, normal breath sounds, no crackles, and no wheezes. Heart:  normal rate, regular rhythm, no murmur, no gallop, and no rub.   Abdomen:  soft, mild tenderness in the bilateral lower quadrants to palpation, hypoactive bowel sounds, no distention, no guarding, no rebound tenderness, no hepatomegaly, and no splenomegaly.   Msk:  no joint swelling, no joint warmth, and no redness over joints.   Pulses:  2+ DP/PT pulses bilaterally Extremities:  No cyanosis, clubbing, edema Neurologic:  alert & oriented X3, cranial nerves II-XII intact, strength normal in all extremities, sensation intact to light touch, and gait normal.   Skin:  turgor normal and no rashes.   Psych:  Oriented X3, memory intact for recent and remote, normally interactive, good eye contact, not anxious appearing, and not depressed appearing.  Lab results:  Admission on 08/15/2010  Component Value Range  . Neutrophils Relative (%) 58  43-77  . Lymphocytes Relative (%) 31  12-46  . Monocytes Relative (%) 8  3-12  . Eosinophils Relative (%) 1  0-5  . Basophils Relative (%) 2* 0-1  . Neutro Abs (K/uL) 3.5  1.7-7.7  . Lymphs Abs (K/uL) 1.9  0.7-4.0  . Monocytes Absolute (K/uL) 0.5  0.1-1.0  . Eosinophils Absolute (K/uL) 0.1  0.0-0.7  . Basophils Absolute (K/uL) 0.1  0.0-0.1  . RBC Morphology  POLYCHROMASIA PRESENT    . WBC Morphology  ATYPICAL LYMPHOCYTES    . Smear Review  LARGE PLATELETS PRESENT    . WBC (K/uL) 6.1  4.0-10.5  . RBC (MIL/uL) 4.34  3.87-5.11  . Hemoglobin (g/dL) 09.8  11.9-14.7  . HCT (%) 36.1  36.0-46.0  . MCV (fL) 83.2  78.0-100.0  . MCH (pg) 28.8  26.0-34.0  . MCHC (g/dL) 82.9  56.2-13.0  . RDW (%) 13.9  11.5-15.5  . Platelets (K/uL) 303  150-400  . Glucose-Capillary (mg/dL) 99  86-57  . Sodium (mEq/L) 141  135-145  . Potassium (mEq/L) 3.5  3.5-5.1  . Chloride (mEq/L) 103   96-112  . CO2 (mEq/L) 24  19-32  . Glucose, Bld (mg/dL) 96  84-69  . BUN (mg/dL) 18  6-29  . Creatinine, Ser (mg/dL) 5.28* 4.13-2.44   **Please note change in reference range.**  . Calcium (mg/dL) 9.0  0.1-02.7  . Total Protein (g/dL) 8.2  2.5-3.6  . Albumin (g/dL) 3.1* 6.4-4.0  .  AST (U/L) 94* 0-37  . ALT (U/L) 88* 0-35  . Alkaline Phosphatase (U/L) 90  39-117  . Total Bilirubin (mg/dL) 0.7  5.7-8.4  . GFR calc non Af Amer (mL/min) 35* >60  . GFR calc Af Amer (mL/min) 43* >60   Comment:                                 The eGFR has been calculated                          using the MDRD equation.                          This calculation has not been                          validated in all clinical                          situations.                          eGFR's persistently                          <60 mL/min signify                          possible Chronic Kidney Disease.  . Color, Urine  YELLOW  YELLOW  . Appearance  CLEAR  CLEAR  . Specific Gravity, Urine  1.023  1.005-1.030  . pH  6.0  5.0-8.0  . Glucose, UA (mg/dL) NEGATIVE  NEGATIVE  . Hgb urine dipstick  LARGE* NEGATIVE  . Bilirubin Urine  MODERATE* NEGATIVE  . Ketones (mg/dL) 15* NEGATIVE  . Protein (mg/dL) 696* NEGATIVE  . Urobilinogen, UA (mg/dL) 0.2  2.9-5.2  . Nitrite  NEGATIVE  NEGATIVE  . Leukocytes, UA  SMALL* NEGATIVE  . Squamous Epithelial / LPF  FEW* RARE  . Bacteria, UA  RARE  RARE  . Casts  GRANULAR CAST* NEGATIVE   HYALINE CASTS  . Urine-Other  MUCOUS PRESENT     Imaging results:  None  Assessment & Plan by Problem:  1) Nausea and Vomiting - This is secondary to non-resolved pyelonephritis which the patient was admitted for on 08/11/10 and discharged on 08/14/10, today urine dipstick significant for present leukocytes.  Plan  - Admit patient to regular floor - Ceftriaxone IV - Zofran for Nausea - Supportive care with IV fluids - Advance diet as tolerated  2) Hypertension - well  controlled, continue home meds.   3) DVT prophylaxis - heparin 5000 units subcutaneous every 8 hours

## 2010-08-16 DIAGNOSIS — R112 Nausea with vomiting, unspecified: Secondary | ICD-10-CM

## 2010-08-16 DIAGNOSIS — N12 Tubulo-interstitial nephritis, not specified as acute or chronic: Secondary | ICD-10-CM

## 2010-08-16 LAB — CBC
MCV: 83.7 fL (ref 78.0–100.0)
Platelets: 279 10*3/uL (ref 150–400)
RDW: 13.9 % (ref 11.5–15.5)
WBC: 6.4 10*3/uL (ref 4.0–10.5)

## 2010-08-16 LAB — BASIC METABOLIC PANEL
Calcium: 8.3 mg/dL — ABNORMAL LOW (ref 8.4–10.5)
Chloride: 107 mEq/L (ref 96–112)
Creatinine, Ser: 1.33 mg/dL — ABNORMAL HIGH (ref 0.50–1.10)
GFR calc Af Amer: 50 mL/min — ABNORMAL LOW (ref >60)

## 2010-08-16 LAB — URINE MICROSCOPIC-ADD ON

## 2010-08-16 LAB — CULTURE, BLOOD (SINGLE): Culture: NO GROWTH

## 2010-08-16 LAB — URINE CULTURE

## 2010-08-17 DIAGNOSIS — N12 Tubulo-interstitial nephritis, not specified as acute or chronic: Secondary | ICD-10-CM

## 2010-08-17 DIAGNOSIS — R112 Nausea with vomiting, unspecified: Secondary | ICD-10-CM

## 2010-08-17 LAB — COMPREHENSIVE METABOLIC PANEL
CO2: 24 mEq/L (ref 19–32)
Calcium: 7.9 mg/dL — ABNORMAL LOW (ref 8.4–10.5)
Creatinine, Ser: 1.31 mg/dL — ABNORMAL HIGH (ref 0.50–1.10)
GFR calc Af Amer: 51 mL/min — ABNORMAL LOW (ref >60)
GFR calc non Af Amer: 42 mL/min — ABNORMAL LOW (ref >60)
Glucose, Bld: 89 mg/dL (ref 70–99)

## 2010-08-17 LAB — CBC
Hemoglobin: 10.8 g/dL — ABNORMAL LOW (ref 12.0–15.0)
MCH: 27.6 pg (ref 26.0–34.0)
MCV: 84.9 fL (ref 78.0–100.0)
RBC: 3.91 MIL/uL (ref 3.87–5.11)

## 2010-08-17 LAB — MAGNESIUM: Magnesium: 1.7 mg/dL (ref 1.5–2.5)

## 2010-08-18 LAB — BASIC METABOLIC PANEL
BUN: 11 mg/dL (ref 6–23)
CO2: 23 mEq/L (ref 19–32)
Chloride: 108 mEq/L (ref 96–112)
GFR calc non Af Amer: 51 mL/min — ABNORMAL LOW (ref >60)
Glucose, Bld: 94 mg/dL (ref 70–99)
Potassium: 3.6 mEq/L (ref 3.5–5.1)

## 2010-08-18 LAB — CBC
HCT: 32.9 % — ABNORMAL LOW (ref 36.0–46.0)
Hemoglobin: 10.9 g/dL — ABNORMAL LOW (ref 12.0–15.0)
MCHC: 33.1 g/dL (ref 30.0–36.0)
RBC: 3.86 MIL/uL — ABNORMAL LOW (ref 3.87–5.11)

## 2010-08-24 NOTE — Consult Note (Signed)
Vanessa Palmer, Vanessa Palmer NO.:  [id number]  MEDICAL RECORD NO.:  [id number]  LOCATION:  6713                         FACILITY:  [facility]  PHYSICIAN:  Ardeth Sportsman, MD     DATE OF BIRTH:  [birth year]  DATE OF CONSULTATION:  08/11/2010 DATE OF DISCHARGE:                                CONSULTATION   REQUESTING PHYSICIAN:  Ileana Roup, MD, of Internal Medicine Teaching Program.  CONSULTING SURGEON:  Ardeth Sportsman, MD  REASON FOR CONSULTATION:  Abdominal pain.  HISTORY OF PRESENT ILLNESS:  Vanessa Palmer is a 60 year old African American female with a complex medical history including hypertension, coronary artery disease, chronic renal insufficiency, depression, asthma, gout, who has been complaining about 4 days of generalized fatigue and malaise and body aches.  This was accompanied by abdominal pain and subsequently developed into some altered mental status.  She became more lethargic and lost her appetite, and in addition developed some dysuria symptoms.  She came in and ended up being admitted for altered mental status and evidence of UTI with acute on chronic renal insufficiency.  She does have a history of gastroesophageal reflux disease, but states this is no worse.  Actually prior to the onset of symptoms, the patient was in her usual state of health.  She had normal appetite and was eating well.  She was pretty regular with regard to bowel movements, occasionally having to take something to help have a bowel movement, but no regular constipation.  She otherwise denies hematochezia or melena.  She denies any hematemesis.  She again reported dysuria with darkened urine as well and ongoing less over the past 5-7 days.  However, since being admitted, she has been diagnosed with UTI. CT scan today shows evidence of perinephric stranding, possibly consistent with pyelonephritis.  The patient actually states that she is feeling better significant improvement  in her mental status, but continues to have abdominal discomfort.  Therefore, surgical consult was warranted to see if there is any additional findings in her abdomen.  PAST MEDICAL HISTORY:  Please see history of present illness for other pertinent findings.  PAST SURGICAL HISTORY:  The patient has had a partial hysterectomy many years ago.  SOCIAL HISTORY:  She is on disability due to degenerative joint disease. She otherwise denies alcohol, tobacco or illicit drug use.  FAMILY HISTORY:  Noncontributory to the present case.  CURRENT MEDICATIONS: 1. Allopurinol. 2. Aspirin. 3. Carvedilol. 4. Celexa. 5. Diazepam. 6. Diltiazem. 7. Fluticasone. 8. Gabapentin. 9. Lamictal. 10.Losartan. 11.Nitrostat sublingual. 12.Prilosec. 13.Elmiron. 14.Vesicare. 15.Ultram. 16.Desyrel.  ALLERGIES: 1. ACE INHIBITORS. 2. DILAUDID. 3. DARVOCET.  REVIEW OF SYSTEMS:  Please see history of present illness for pertinent findings.  PHYSICAL EXAMINATION:  GENERAL:  This is a 59-year obese female who does not appear toxic or in acute distress at present time. VITAL SIGNS:  Current temperature is 98.1 with a 24-hour max of 102.1, heart rate of 63, blood pressure of 106/70, respiratory rate of 18. LUNGS:  Clear to auscultation.  No wheezes, rhonchi, or rales.  Normal respiratory effort without use of accessory muscles. HEART:  Regular rate and rhythm.  No murmurs, gallops, or rubs. Carotids 2+  and brisk without bruits.  Peripheral pulses intact and symmetrical. ABDOMEN:  Obese, mildly distended.  She is diffusely tender, but has no evidence of rebound, guarding, or peritonitis.  Predominantly, the tenderness is in the left midabdomen.  No CVA tenderness is appreciated. RECTAL:  Exam is deferred.  DIAGNOSTICS:  CBC showed a white blood cell count of 7.6, hemoglobin of 11.8, hematocrit of 34.6, platelet count of 162.  Metabolic panel showed a sodium of 134, potassium of 4, chloride of 99, CO2  of 25, BUN of 36, creatinine of 2.5, diminished from the admission creatinine of 3.29, glucose of 114, bilirubin mildly elevated at 1.9.  Remaining LFTs within normal limits.  IMAGING:  CT scan of the abdomen and pelvis shows evidence of mild left- sided perinephric stranding, possibly consistent with early or mild pyelonephritis.  The gallbladder is visualized and appears to be within normal limits as is the appendix, small and large bowel.  No evidence of obstruction, colitis, cholecystitis, appendicitis, or pancreatitis is seen.  IMPRESSION: 1. Abdominal pain. 2. Pyelonephritis/urinary tract infection. 3. Multiple comorbid medical problems.  RECOMMENDATIONS:  Agree with IV fluids and antibiotics.  Feel this patient has isolated pyelonephritis with anterior abdominal pain that will continue to improve with current regimen.  Do not find any other acute abdominal problems that would require surgical intervention at present time.  We will be happy to see this patient again should she not show improving signs.  Otherwise, we will be available on a p.r.n. basis.  The patient was seen and evaluated by Dr. Karie Soda.  This treatment plan and assessment actually was discussed also with Dr. Malen Palmer of the Teaching Service.     Brayton El, PA-C   ______________________________ Ardeth Sportsman, MD    KB/MEDQ  D:  08/11/2010  T:  08/12/2010  Job:  [id number]  cc:   Ileana Roup, M.D.  Electronically Signed by Brayton El  on 08/21/2010 02:43:56 PM Electronically Signed by Karie Soda MD on 08/24/2010 07:15:56 AM

## 2010-08-29 ENCOUNTER — Ambulatory Visit: Payer: Medicaid Other | Admitting: Internal Medicine

## 2010-09-01 NOTE — Discharge Summary (Signed)
Vanessa Palmer, Vanessa Palmer NO.:  [id number]  MEDICAL RECORD NO.:  [id number]  LOCATION:  6713                         FACILITY:  [facility]  PHYSICIAN:  Ileana Roup, M.D.  DATE OF BIRTH:  [birth year]  DATE OF ADMISSION:  08/10/2010 DATE OF DISCHARGE:  08/14/2010                              DISCHARGE SUMMARY   PRIMARY CARE PHYSICIAN:  [facility].  PRIMARY NEPHROLOGIST:  Wilber Bihari. Caryn Section, MD with [facility].  DISCHARGE DIAGNOSES: 1. Acute pyelonephritis, resolved at the time of discharge. 2. Acute renal failure secondary to prerenal azotemia, resolved at the     time of discharge.  Her creatinine at the time of discharge is     1.14. 3. Chronic kidney disease with baseline creatinine thought to be     around 2.2 prior to this admission. 4. Hypertension. 5. Degenerative joint disease and chronic back and knee pain. 6. Major depressive disorder. 7. Gout. 8. Mild-to-moderate obstructive sleep apnea as per the sleep study in     2007.  The patient is not using CPAP at this time. 9. Coronary artery disease with non-Q-wave MIs in 2004 and 2005,     catheterization in 2006 showed minimal coronary disease. 10.Past alcohol abuse, none since November 2006. 11.Asthma with PFTs done in August 2011.  Well controlled without     frequent exacerbations. 12.Hyperlipidemia. 13.Mildly elevated direct bilirubin during this admission of 1.1 with     indirect bilirubin of 0.7 and normal liver function tests,     otherwise.  DISCHARGE MEDICATIONS: 1. Tylenol 325 mg 1-2 tablets every 6 hours as needed for pain. 2. Ciprofloxacin 500 mg 1 tablet by mouth twice a day for 10 days. 3. Celexa 40 mg 1 tablet by mouth daily. 4. Albuterol 2 puffs every 4 hours as needed for shortness of breath. 5. Allopurinol 300 mg 1 tablet by mouth daily. 6. Ambien 5 mg 1 tablet by mouth daily at bedtime as needed. 7. Aspirin 325 mg 1 tablet by mouth daily. 8. Coreg 25  mg 1 tablet by mouth twice a day. 9. Flonase 1 puff twice a day. 10.Neurontin 300 mg 1 capsule by mouth 3 times a day. 11.Losartan 50 mg 1 tablet by mouth once a day. 12.Omeprazole 40 mg 1 tablet by mouth once a day. 13.Tramadol 50 mg 1 tablet by mouth 4 times a day as needed for pain. 14.Vesicare 1 tablet by mouth daily.  The patient is to stop taking the following medications, diltiazem 120 mg.  DISPOSITION AND FOLLOWUP:  The patient will be followed up in the outpatient clinic and with her primary nephrologist, Dr. Caryn Section within 1 to 2-week of discharge.  At her primary care physician's visit, following things needs to be addressed: 1. Resolution of symptoms of pyelonephritis.  Completion of course of     antibiotics. 2. Blood pressure.  The patient was on losartan, Coreg, and Cardizem     at the time of admission.  This was changed to just losartan once a     day and Coreg at the time of discharge, adjust blood pressure     medicines as required. 3. Elevated bilirubin.  The  patient had mild increase bilirubin during     this hospitalization, probably secondary to transient stasis from     intra-abdominal infection, this needs to be followed up as     outpatient.  ADMITTING HISTORY AND PHYSICAL EXAM:  A 60 year old woman with past medical history dictated above, presents from outpatient clinic with main concerns of persistent lower abdominal pain which started about 4 days prior to admission and was getting progressively worse.  Pain was sharp, mostly located in lower quadrants, occasionally radiating to bilateral upper quadrants associated with nausea, vomiting, fevers and chills, intermittent nonbloody diarrhea.  She was also having dysuria and urinary urgency.  PHYSICAL EXAMINATION AT ADMISSION:  VITAL SIGNS:  Temperature 102, blood pressure 100-115/64-77, pulse 76-100, respirations 18-20, oxygen saturation 92% on room air. GENERAL:  Weakness and mild distress from abdominal  pain. CVS:  Regular rate and rhythm. RESPIRATION:  Clear to auscultation bilaterally. ABDOMEN:  Tender to palpation in both lower quadrant with some guarding, no rigidity or rebound tenderness. NEUROLOGIC:  Grossly nonfocal.  PROCEDURES PERFORMED: 1. CT abdomen and pelvis without contrast, which showed mild left     perinephric stranding but no hydroureter or hydronephrosis or renal     calculi.  Findings were suggestive of left-sided pyelonephritis. 2. CT head without contrast at admission for headache and blurry     vision and CT head did not show any intracranial abnormality.  CONSULTATIONS:  General Surgery provided by Dr. Karie Soda for severe abdominal pain in setting of CT scan showing mild pyelonephritis.  Dr. Michaell Cowing did not feel that this patient needed any further surgical workup and continued medical treatment for pyelonephritis.  HOSPITAL COURSE: 1. Acute pyelonephritis.  The patient was started on IV Zosyn at the     time of admission, which was continued throughout this     hospitalization.  Her urine culture and blood pressure grew E. coli     sensitive to ciprofloxacin, Levaquin, Bactrim and Rocephin.  The     patient on the day of discharge was changed to ciprofloxacin to be     continued for 10 days.  The patient's pain continued to get better     during this hospitalization and did not require any further     narcotic pain medicine prior to discharge.  She has been afebrile     for 2 days prior to discharge.  She has been able to tolerate solid     food without any difficulty.  She does not have any current     complaints of dysuria or urinary urgency or frequency.  The patient     has been instructed to return to the clinic or come to the ED in     case pain worsens.  She will be followed up in the outpatient     clinic in next 1-2 weeks, at which time resolution of her symptoms     of pyelonephritis will be followed upon. 2. Acute renal failure.  The patient  presented with a creatinine of     3.29 which was much higher than her known baseline of 2.2.     Initially, the patient appeared dehydrated on admission and from     her history, she did not have any significant p.o. intake for a     week prior to admission because of nausea from the pyelonephritis.     We started aggressively hydrating the patient during this     hospitalization and her creatinine continued  to get significantly     better throughout the hospital course.  Her creatinine at the time     of discharge was 1.14, which seems to be better than her previous     known baseline.  She is followed by Dr. Caryn [facility] with [facility] and the patient has an appointment with him on August 24, 2010, at she which time her chronic kidney disease will be followed     upon.  The patient is making good urine at the time of discharge. 3. Hypertension.  The patient was on Cardizem, Coreg, and losartan at     the time of admission.  Her losartan was held throughout the     hospital course because of acute renal failure.  We continued her     Coreg during this hospitalization, which suboptimally controlled     her blood pressure which stayed in between 150-160 systolic and her     heart rate stayed in between 55-65.  We added losartan back at the     time of discharge at 50 mg once a day for blood pressure control.     This will need to be followed up in the outpatient clinic and     further antihypertensives can be added as needed.  All the other past medical problems like gastroesophageal reflux disease, hyperlipidemia, depression are stable without any change in the medications during this hospital course.  DISCHARGE VITALS:  Temperature 97.6, pulse 52, respirations 20, blood pressure 154/94, oxygen saturation 100% on room air.  DISCHARGE LABORATORIES:  Sodium 130.40, potassium 3.9, chloride 104, bicarb 26, glucose 107, BUN 14, creatinine 1.14, calcium 8.7.  White count 4.5,  hemoglobin 11.7, platelet count 237, blood cultures positive for E. coli sensitive to ciprofloxacin.  Urine culture was positive for E. coli sensitive to ciprofloxacin.     Bethel Born, MD   ______________________________ Ileana Roup, M.D.    MD/MEDQ  D:  08/14/2010  T:  08/15/2010  Job:  [id number]  cc:   Wilber Bihari. Caryn Section, M.D.  Electronically Signed by Bethel Born  on 08/16/2010 07:27:10 AM Electronically Signed by Margarito Liner M.D. on 09/01/2010 06:50:06 PM

## 2010-09-05 NOTE — Discharge Summary (Signed)
Vanessa Palmer, Vanessa Palmer NO.:  [id number]  MEDICAL RECORD NO.:  [id number]  LOCATION:  [facility]                         FACILITY:  [facility]  PHYSICIAN:  Ileana Roup, M.D.  DATE OF BIRTH:  1951/01/14  DATE OF ADMISSION:  08/15/2010 DATE OF DISCHARGE:  08/19/2010                              DISCHARGE SUMMARY   PRIMARY CARE PHYSICIAN:  [facility].  PRIMARY NEPHROLOGIST:  Dr. Marina Gravel with [facility].  DISCHARGE DIAGNOSES: 1. Acute pyelonephritis.  This was the repeat admission for the same     problem since her last discharge on August 14, 2010.  Seems to be     have resolved at the time of discharge. 2. Hypertension. 3. Degenerative joint disease, chronic back and knee pain. 4. Chronic kidney disease with baseline creatinine of about 1.2.  This     was previously thought to be around 2.2 and the patient was     following with Dr. Caryn Section in the past for this. 5. Major depressive disorder. 6. Gout. 7. Mild-to-moderate obstructive sleep apnea as per sleep study in     2007.  The patient is not using CPAP at this time. 8. Coronary artery disease with non-Q-wave myocardial infarction in     2004 and 2005, catheterization in 2006 showing minimal coronary     artery disease. 9.Asthma with PFTs done in August 2011.  Well controlled without     frequent exacerbations. 10.Hyperlipidemia.  Mildly elevated transaminases at AST of 52 and ALT     of 77 with a normal alk phos and total bilirubin during this     admission.  DISCHARGE MEDICATIONS: 1. Aspirin 81 mg daily. 2. Enablex 7.5 mg daily at bedtime. 3. Lamictal 100 mg 1 tablet by mouth daily. 4. Losartan 100 mg 1 tablet by mouth daily. 5. Albuterol 2 puffs every 4 hours as needed for shortness of breath. 6. Allopurinol 300 mg 1 tablet daily. 7. Ambien 5 mg 1 tablet by mouth daily at bedtime as needed for sleep. 8. Tylenol 325 mg 1-2 tablets by mouth every 6 hours as needed for  pain. 9. Coreg 25 mg 1 tablet by mouth twice a day. 10.Ciprofloxacin 500 mg 1 tablet by mouth twice a day for 7 days. 11.Celexa 40 mg 1 tablet by mouth daily. 12.Flonase 44 mcg 1 puff twice a day. 13.Neurontin 300 mg 1 tablet by mouth 3 times a day. 14.Omeprazole 40 mg 1 tablet by mouth daily. 15.Tramadol 50 mg 1 tablet by mouth 4 times a day as needed for pain.  DISPOSITION AND FOLLOWUP: 1. The patient will be followed up within 1 week from discharge in the     [facility].  At that time, please assess     resolution of symptoms of pyelonephritis.  The patient is not     having any abdominal pain or dysuria at the time of discharge.  She     is able to tolerate p.o. intake.  Please also make sure the patient     has filled her prescription of ciprofloxacin and is taking it     regularly. 2. Blood pressure.  The patient's  blood pressure was suboptimally     controlled in the hospital.  Her losartan dose was increased to 100     mg and we continued her Coreg to 25 mg twice a day.  The patient     was on Cardizem in the past which we have stopped at this time but     can be restarted in the future if needed.  This will be followed up     by her primary care physician.  ADMITTING HISTORY AND PHYSICAL:  A 60 year old female with past medical history dictated above was recently discharged from our service with resolving pyelonephritis.  However, the patient failed to fill her antibiotic prescription and returned to [facility] Department with persistent nausea and vomiting.  She stated that she was unable to tolerate any liquid or solid since her discharge.  She also continued to have lower abdominal pain that sometimes radiate to her back as well as lightheadedness when she stands and walks.  She stated that she had not been urinating as much but it does burn occasionally when she does.  She denied fever, chills, chest pain, frequency, diarrhea, or  constipation.  PHYSICAL EXAMINATION AT ADMISSION:  VITAL SIGNS:  Temperature 98, blood pressure 178/96, heart rate 58, respiration 20, oxygen saturation 99% on room air. GENERAL:  Alert, well developed, cooperative to exam. NECK:  Supple.  No JVD. LUNGS:  Clear to auscultation bilaterally. HEART:  Regular rate and rhythm.  No rubs or murmurs. ABDOMEN:  Soft, mildly tender in bilateral lower quadrants, hyperactive bowel sounds.  No guarding or rigidity. EXTREMITIES:  No edema.  Pulses 2+ bilaterally. NEUROLOGICAL:  Nonfocal.  Labs at admission showed a white count of 6.1, hemoglobin 12.5, hematocrit 36.1.  Sodium 141, potassium 3.5, chloride 103, bicarb 24, BUN 18, creatinine 1.52.  UA showed yellow colored urine with small amount of leukocytes, few squamous epithelial cells, and rare bacteria. Urine culture was negative.  PROCEDURES PERFORMED:  None.  CONSULTATION:  None.  HOSPITAL COURSE: 1. Acute pyelonephritis.  The patient was admitted with unresolved     pyelonephritis, getting worse after discharge.  She was started on     IV ceftriaxone based on the culture results we had during the last     admission.  This was continued until the day of discharge when she     was switched to ciprofloxacin.  She has been given a prescription     for 7 days of ciprofloxacin twice a day.  She has been strongly     advised to fill her prescription on the day of discharge and start     taking them regularly.  In case she experiences pain, inability to     take p.o. intake, she is advised to come to the emergency room     again.  She is able to tolerate p.o. intake and has no pain at all     while she has been in the hospital so far.  She does not have any     urinary complaints as well.  Her white count is normal and she has     been afebrile since her admission.  We will follow up her in the     clinic within a week at which time we will assess if her     pyelonephritis has completely  resolved.  If she continues to have     episodes of pyelonephritis, she will need urologic workup to rule  out any obstruction.  At this time, this seems to be uncomplicated     pyelonephritis and we will complete the course of ciprofloxacin in     the Outpatient Clinic. 2. Hypertension.  The patient's blood pressure has been ranging in 140-     170 systolic in the hospital.  She was on Coreg 25 mg once a day     and losartan 50 mg once a day which we have changed to Coreg 25 mg     twice a day and losartan 100 mg once a day.  She was also on     Cardizem in the past but this was stopped during last admission     secondary to hypotension and bradycardia.  This can be restarted in     the Outpatient Clinic if needed for blood pressure control. 3. All her other medical problems are unchanged and we have continued     her home medicines as dictated above.  DISCHARGE VITALS:  Temperature 97.8, pulse 62, respirations 16, blood pressure 175/95, oxygen saturation 97% on room air.  DISCHARGE LABS:  Sodium 139, potassium 3.6, chloride 108, bicarb 23, glucose 94, BUN 11, creatinine 1.1, calcium 8.3, white count 7.9, hemoglobin 10.9 with platelet count 330,000.  Urine cultures negative.     Bethel Born, MD   ______________________________ Ileana Roup, M.D.    MD/MEDQ  D:  08/19/2010  T:  08/19/2010  Job:  [id number]  Electronically Signed by Bethel Born  on 08/20/2010 05:55:02 PM Electronically Signed by Margarito Liner M.D. on 09/05/2010 12:12:47 PM

## 2010-09-11 ENCOUNTER — Telehealth: Payer: Self-pay | Admitting: *Deleted

## 2010-09-11 NOTE — Telephone Encounter (Signed)
Contacted medicaid at [id number] after receiving a prior authorization request from [facility] [id number] for enablex 7.5mg  tab one at bedtime.  PA request was denied, pt will need to try and fail 2 of the preferred medications on the list which includes vesicare, oxybutynin, or toviaz.  Per pt's chart, she is already on vesicare, so she will need to try one additional med if the vesicare is not working.  Will forward info to prescribing MD (Dr Scot Dock) review.Kingsley Spittle Cassady7/16/20129:51 AM

## 2010-09-12 NOTE — Telephone Encounter (Signed)
Please forward to pcp .    Thank you

## 2010-09-19 ENCOUNTER — Encounter: Payer: Self-pay | Admitting: Internal Medicine

## 2010-09-19 ENCOUNTER — Ambulatory Visit (INDEPENDENT_AMBULATORY_CARE_PROVIDER_SITE_OTHER): Payer: Medicaid Other | Admitting: Internal Medicine

## 2010-09-19 DIAGNOSIS — M109 Gout, unspecified: Secondary | ICD-10-CM

## 2010-09-19 DIAGNOSIS — I10 Essential (primary) hypertension: Secondary | ICD-10-CM

## 2010-09-19 DIAGNOSIS — K219 Gastro-esophageal reflux disease without esophagitis: Secondary | ICD-10-CM

## 2010-09-19 DIAGNOSIS — N183 Chronic kidney disease, stage 3 unspecified: Secondary | ICD-10-CM

## 2010-09-19 DIAGNOSIS — N182 Chronic kidney disease, stage 2 (mild): Secondary | ICD-10-CM

## 2010-09-19 DIAGNOSIS — M1990 Unspecified osteoarthritis, unspecified site: Secondary | ICD-10-CM

## 2010-09-19 DIAGNOSIS — I739 Peripheral vascular disease, unspecified: Secondary | ICD-10-CM

## 2010-09-19 DIAGNOSIS — F331 Major depressive disorder, recurrent, moderate: Secondary | ICD-10-CM

## 2010-09-19 HISTORY — DX: Chronic kidney disease, stage 3 unspecified: N18.30

## 2010-09-19 MED ORDER — FUROSEMIDE 20 MG PO TABS: ORAL_TABLET | ORAL | Status: DC

## 2010-09-19 MED ORDER — CARVEDILOL 25 MG PO TABS: 25.0000 mg | ORAL_TABLET | Freq: Two times a day (BID) | ORAL | Status: DC

## 2010-09-19 MED ORDER — TRUFORM STOCKINGS 30-40MMHG MISC: Status: DC

## 2010-09-19 MED ORDER — TRAMADOL HCL 50 MG PO TABS: 50.0000 mg | ORAL_TABLET | Freq: Four times a day (QID) | ORAL | Status: DC | PRN

## 2010-09-19 MED ORDER — ALLOPURINOL 100 MG PO TABS: 100.0000 mg | ORAL_TABLET | Freq: Every day | ORAL | Status: DC

## 2010-09-19 MED ORDER — OMEPRAZOLE 40 MG PO CPDR: 40.0000 mg | DELAYED_RELEASE_CAPSULE | Freq: Every day | ORAL | Status: DC

## 2010-09-19 NOTE — Assessment & Plan Note (Signed)
Patient does not report worsening of her Sx.  She uses a mobile scooter for ambulation outside and most of the time inside her home. Will refill Tramadol. Side-effects reviewed.

## 2010-09-19 NOTE — Assessment & Plan Note (Signed)
Instructed to decrease salt intake to 1 gm daily; elevate her feet above heart level while being seated; Rx for 40 mm of HG compression knee highs; Lasix 10 mg PO daily x 5 days for symptom relief.

## 2010-09-19 NOTE — Progress Notes (Signed)
  Subjective:    Patient ID: Vanessa Palmer, female    DOB: 1950/12/23, 60 y.o.   MRN: [id number]  HPI 1. Patient requests "to fill in a paperwork for her life insurance policy." 2. C/O bilateral swelling of her feet when " stands or tries to walk." Denies any  Calf pain, SOB, pl.euritic CP, PND, orthopnea, fever, chills, or rash. Denies any recent prolonged immobilization or travel.   Review of Systems  Constitutional: Positive for fatigue. Negative for fever, chills, diaphoresis, activity change, appetite change and unexpected weight change.  HENT: Negative.   Eyes: Negative for visual disturbance.  Respiratory: Negative.   Cardiovascular: Negative.   Gastrointestinal: Negative.   Genitourinary: Positive for frequency. Negative for dysuria, urgency, hematuria, flank pain, decreased urine volume, vaginal bleeding, vaginal discharge, enuresis, difficulty urinating, genital sores, vaginal pain and pelvic pain.  Musculoskeletal: Positive for back pain, joint swelling, arthralgias and gait problem.  Neurological: Positive for weakness. Negative for dizziness, tremors, seizures, syncope, speech difficulty, light-headedness, numbness and headaches.  Hematological: Negative for adenopathy. Does not bruise/bleed easily.  Psychiatric/Behavioral: Positive for decreased concentration. Negative for hallucinations, behavioral problems and confusion.        Objective:   Physical Exam General: Vital signs reviewed and noted. Well-developed, well-nourished, in no acute distress; alert, appropriate and cooperative throughout examination.  Head: Normocephalic, atraumatic.  Neck: No deformities, masses, or tenderness noted.  Lungs:  Normal respiratory effort. Clear to auscultation BL without crackles or wheezes.  Heart: RRR. S1 and S2 normal without gallop, murmur, or rubs.  Abdomen:  BS normoactive. Soft, Nondistended, non-tender.  No masses or organomegaly.  Extremities: Pretibial pitting edema 2+/4  bilaterally; pedal pulses 2+/4 bilaterally; no clubbing or cyanoses; no calf TTP bilaterally.           Assessment & Plan:

## 2010-09-19 NOTE — Assessment & Plan Note (Signed)
Controlled. Continue with Losartan and Cardizem per Renal Recs (Sees Dr. Caryn Section). Will order B-met today.

## 2010-09-19 NOTE — Patient Instructions (Signed)
Please, complete your paperfowrk in the spaces designated for your [employer] policy. Please, stop taking allopurinol until speak with Dr. Caryn Section (Kidney specialsit). Reduce salt intake in your diet. Elevate your feet above heart level while being seated; wear compression stockings. Please, start taking Lasix 10 mg one tablet by mouth once a day for 5 days for leg swelling. Please, call with any questions.

## 2010-09-19 NOTE — Assessment & Plan Note (Signed)
Patient is being followed by Dr. Caryn Section (renal). Continue with Losartan and Diltiazem. Check B-met. Hold Allopurinol.

## 2010-09-20 ENCOUNTER — Other Ambulatory Visit: Payer: Self-pay | Admitting: *Deleted

## 2010-09-20 LAB — BASIC METABOLIC PANEL WITH GFR
BUN: 16 mg/dL (ref 6–23)
CO2: 24 mEq/L (ref 19–32)
Chloride: 100 mEq/L (ref 96–112)
Creat: 1.54 mg/dL — ABNORMAL HIGH (ref 0.50–1.10)
Glucose, Bld: 91 mg/dL (ref 70–99)
Potassium: 4.2 mEq/L (ref 3.5–5.3)

## 2010-09-20 NOTE — Telephone Encounter (Signed)
Pt meds called into wrong pharmacy. Changed to correct pharmacy in [facility] ([place])

## 2010-09-26 ENCOUNTER — Telehealth: Payer: Self-pay | Admitting: *Deleted

## 2010-09-26 NOTE — Telephone Encounter (Signed)
Pt called stating she was given lasix on last visit.  She feels this is giving her diarrhea and has not taken today. She has had only 4 doses.  She has some left and wants to know if she should take the rest.  Your orders say take for 5 days.   She did not get the compression hose because she can't afford.   Swelling has not changed, increases with ambulation. Please advise   Pt # [id number]  Called pharmacist and asked if lasix will give diarrhea.  Not a common side effect.

## 2010-10-04 NOTE — Progress Notes (Signed)
Addended by: Bufford Spikes on: 10/04/2010 04:09 PM   Modules accepted: Orders

## 2010-10-04 NOTE — Telephone Encounter (Signed)
Talked with pt and diarrhea is resolved. Swelling is better but increases as the day progresses. She will call tomorrow and schedule appointment for next week to be seen for this swelling. She can not afford the compression hose.

## 2010-10-07 ENCOUNTER — Emergency Department (HOSPITAL_COMMUNITY)
Admission: EM | Admit: 2010-10-07 | Discharge: 2010-10-07 | Disposition: A | Discharge status: Home / Self Care | Payer: Medicaid Other | Attending: Emergency Medicine | Admitting: Emergency Medicine

## 2010-10-07 DIAGNOSIS — Z79899 Other long term (current) drug therapy: Secondary | ICD-10-CM | POA: Insufficient documentation

## 2010-10-07 DIAGNOSIS — I2510 Atherosclerotic heart disease of native coronary artery without angina pectoris: Secondary | ICD-10-CM | POA: Insufficient documentation

## 2010-10-07 DIAGNOSIS — F329 Major depressive disorder, single episode, unspecified: Secondary | ICD-10-CM | POA: Insufficient documentation

## 2010-10-07 DIAGNOSIS — M129 Arthropathy, unspecified: Secondary | ICD-10-CM | POA: Insufficient documentation

## 2010-10-07 DIAGNOSIS — E7800 Pure hypercholesterolemia, unspecified: Secondary | ICD-10-CM | POA: Insufficient documentation

## 2010-10-07 DIAGNOSIS — I10 Essential (primary) hypertension: Secondary | ICD-10-CM | POA: Insufficient documentation

## 2010-10-07 DIAGNOSIS — Z862 Personal history of diseases of the blood and blood-forming organs and certain disorders involving the immune mechanism: Secondary | ICD-10-CM | POA: Insufficient documentation

## 2010-10-07 DIAGNOSIS — I252 Old myocardial infarction: Secondary | ICD-10-CM | POA: Insufficient documentation

## 2010-10-07 DIAGNOSIS — R10816 Epigastric abdominal tenderness: Secondary | ICD-10-CM | POA: Insufficient documentation

## 2010-10-07 DIAGNOSIS — Z8639 Personal history of other endocrine, nutritional and metabolic disease: Secondary | ICD-10-CM | POA: Insufficient documentation

## 2010-10-07 DIAGNOSIS — K219 Gastro-esophageal reflux disease without esophagitis: Secondary | ICD-10-CM | POA: Insufficient documentation

## 2010-10-07 DIAGNOSIS — F3289 Other specified depressive episodes: Secondary | ICD-10-CM | POA: Insufficient documentation

## 2010-10-07 DIAGNOSIS — R109 Unspecified abdominal pain: Secondary | ICD-10-CM | POA: Insufficient documentation

## 2010-10-07 DIAGNOSIS — R1115 Cyclical vomiting syndrome unrelated to migraine: Secondary | ICD-10-CM | POA: Insufficient documentation

## 2010-10-07 LAB — DIFFERENTIAL
Lymphocytes Relative: 24 % (ref 12–46)
Lymphs Abs: 1.2 10*3/uL (ref 0.7–4.0)
Monocytes Absolute: 0.3 10*3/uL (ref 0.1–1.0)
Monocytes Relative: 6 % (ref 3–12)
Neutro Abs: 3.4 10*3/uL (ref 1.7–7.7)
Neutrophils Relative %: 69 % (ref 43–77)

## 2010-10-07 LAB — URINALYSIS, ROUTINE W REFLEX MICROSCOPIC
Nitrite: NEGATIVE
Protein, ur: 30 mg/dL — AB
Specific Gravity, Urine: 1.014 (ref 1.005–1.030)
Urobilinogen, UA: 1 mg/dL (ref 0.0–1.0)

## 2010-10-07 LAB — COMPREHENSIVE METABOLIC PANEL
ALT: 7 U/L (ref 0–35)
Alkaline Phosphatase: 102 U/L (ref 39–117)
BUN: 13 mg/dL (ref 6–23)
CO2: 25 mEq/L (ref 19–32)
Calcium: 9.8 mg/dL (ref 8.4–10.5)
GFR calc Af Amer: 49 mL/min — ABNORMAL LOW (ref >60)
GFR calc non Af Amer: 40 mL/min — ABNORMAL LOW (ref >60)
Glucose, Bld: 107 mg/dL — ABNORMAL HIGH (ref 70–99)
Total Protein: 8.6 g/dL — ABNORMAL HIGH (ref 6.0–8.3)

## 2010-10-07 LAB — CBC
HCT: 36.1 % (ref 36.0–46.0)
Hemoglobin: 12.8 g/dL (ref 12.0–15.0)
MCH: 29.2 pg (ref 26.0–34.0)
MCHC: 35.5 g/dL (ref 30.0–36.0)
RBC: 4.39 MIL/uL (ref 3.87–5.11)

## 2010-10-07 LAB — URINE MICROSCOPIC-ADD ON

## 2010-10-07 LAB — LIPASE, BLOOD: Lipase: 33 U/L (ref 11–59)

## 2010-10-09 ENCOUNTER — Ambulatory Visit: Payer: Medicaid Other | Admitting: Internal Medicine

## 2010-10-18 ENCOUNTER — Emergency Department (HOSPITAL_COMMUNITY): Payer: Medicaid Other

## 2010-10-18 ENCOUNTER — Emergency Department (HOSPITAL_COMMUNITY)
Admission: EM | Admit: 2010-10-18 | Discharge: 2010-10-18 | Disposition: A | Discharge status: Home / Self Care | Payer: Medicaid Other | Attending: Emergency Medicine | Admitting: Emergency Medicine

## 2010-10-18 DIAGNOSIS — R112 Nausea with vomiting, unspecified: Secondary | ICD-10-CM | POA: Insufficient documentation

## 2010-10-18 DIAGNOSIS — I2510 Atherosclerotic heart disease of native coronary artery without angina pectoris: Secondary | ICD-10-CM | POA: Insufficient documentation

## 2010-10-18 DIAGNOSIS — E7800 Pure hypercholesterolemia, unspecified: Secondary | ICD-10-CM | POA: Insufficient documentation

## 2010-10-18 DIAGNOSIS — R10819 Abdominal tenderness, unspecified site: Secondary | ICD-10-CM | POA: Insufficient documentation

## 2010-10-18 DIAGNOSIS — R109 Unspecified abdominal pain: Secondary | ICD-10-CM | POA: Insufficient documentation

## 2010-10-18 DIAGNOSIS — I252 Old myocardial infarction: Secondary | ICD-10-CM | POA: Insufficient documentation

## 2010-10-18 DIAGNOSIS — Z79899 Other long term (current) drug therapy: Secondary | ICD-10-CM | POA: Insufficient documentation

## 2010-10-18 DIAGNOSIS — I10 Essential (primary) hypertension: Secondary | ICD-10-CM | POA: Insufficient documentation

## 2010-10-18 LAB — URINALYSIS, ROUTINE W REFLEX MICROSCOPIC
Bilirubin Urine: NEGATIVE
Leukocytes, UA: NEGATIVE
Nitrite: NEGATIVE
Specific Gravity, Urine: 1.012 (ref 1.005–1.030)
Urobilinogen, UA: 1 mg/dL (ref 0.0–1.0)

## 2010-10-18 LAB — URINE MICROSCOPIC-ADD ON

## 2010-10-18 LAB — COMPREHENSIVE METABOLIC PANEL
ALT: 9 U/L (ref 0–35)
BUN: 10 mg/dL (ref 6–23)
CO2: 27 mEq/L (ref 19–32)
Calcium: 9.9 mg/dL (ref 8.4–10.5)
Creatinine, Ser: 1.33 mg/dL — ABNORMAL HIGH (ref 0.50–1.10)
GFR calc Af Amer: 50 mL/min — ABNORMAL LOW (ref >60)
GFR calc non Af Amer: 41 mL/min — ABNORMAL LOW (ref >60)
Glucose, Bld: 106 mg/dL — ABNORMAL HIGH (ref 70–99)
Sodium: 140 mEq/L (ref 135–145)
Total Protein: 8.5 g/dL — ABNORMAL HIGH (ref 6.0–8.3)

## 2010-10-18 LAB — DIFFERENTIAL
Lymphocytes Relative: 27 % (ref 12–46)
Lymphs Abs: 1.5 10*3/uL (ref 0.7–4.0)
Monocytes Absolute: 0.3 10*3/uL (ref 0.1–1.0)
Monocytes Relative: 5 % (ref 3–12)
Neutro Abs: 3.7 10*3/uL (ref 1.7–7.7)
Neutrophils Relative %: 67 % (ref 43–77)

## 2010-10-18 LAB — CBC
HCT: 35.2 % — ABNORMAL LOW (ref 36.0–46.0)
Hemoglobin: 11.8 g/dL — ABNORMAL LOW (ref 12.0–15.0)
MCH: 28.4 pg (ref 26.0–34.0)
MCHC: 33.5 g/dL (ref 30.0–36.0)
MCV: 84.6 fL (ref 78.0–100.0)
RBC: 4.16 MIL/uL (ref 3.87–5.11)

## 2010-10-19 LAB — URINE CULTURE
Culture  Setup Time: 201208230122
Culture: NO GROWTH

## 2010-10-23 ENCOUNTER — Encounter: Payer: Self-pay | Admitting: Internal Medicine

## 2010-10-23 ENCOUNTER — Ambulatory Visit (INDEPENDENT_AMBULATORY_CARE_PROVIDER_SITE_OTHER): Payer: Medicaid Other | Admitting: Internal Medicine

## 2010-10-23 VITALS — BP 117/78 | HR 70 | Temp 97.8°F | Ht 62.0 in | Wt 246.9 lb

## 2010-10-23 DIAGNOSIS — I10 Essential (primary) hypertension: Secondary | ICD-10-CM

## 2010-10-23 MED ORDER — ONDANSETRON HCL 4 MG PO TABS: 4.0000 mg | ORAL_TABLET | Freq: Every day | ORAL | Status: DC | PRN

## 2010-10-23 MED ORDER — LAMOTRIGINE 100 MG PO TABS: 100.0000 mg | ORAL_TABLET | Freq: Every day | ORAL | Status: DC

## 2010-10-23 MED ORDER — CARVEDILOL 25 MG PO TABS: 25.0000 mg | ORAL_TABLET | Freq: Two times a day (BID) | ORAL | Status: DC

## 2010-10-23 MED ORDER — CITALOPRAM HYDROBROMIDE 40 MG PO TABS: 40.0000 mg | ORAL_TABLET | Freq: Every day | ORAL | Status: DC

## 2010-10-23 NOTE — Patient Instructions (Signed)
You were seen for a follow up visit from the ER. We are not making any changes today. I have prescribed Zofran to help with your nausea. Use it only for nausea as needed. You will follow up with Dr. Denton Meek in October. If you have any questions or need to be seen sooner please feel free to call us. Our number is [phone].

## 2010-10-23 NOTE — Progress Notes (Signed)
I agree with Dr. Lavonna Monarch assessment and plan.

## 2010-10-23 NOTE — Progress Notes (Signed)
Subjective:    Patient ID: Vanessa Palmer, female    DOB: 28-Oct-1950, 60 y.o.   MRN: [id number]  HPI: The patient is a pleasant 60 year old female comes in today for a followup visit from an ER visit. Vanessa Palmer did was have experiencing some abdominal pain diarrhea and nausea on Saturday. Vanessa Palmer did go to the emergency department and was given appropriate workup. There was no acute cause found and Vanessa Palmer was told that Vanessa Palmer had a viral infection. Vanessa Palmer did continue to drink lots of fluids and try to keep down food. They did give her Zofran which alleviated the nausea. Her diarrhea did slow down today Vanessa Palmer is experiencing one to 2 episodes per day. Vanessa Palmer states that Vanessa Palmer's been able to maintain food down and fluids. Her pain is diminished. Vanessa Palmer is not currently having abdominal pain. Vanessa Palmer has not had fevers or chills at home. Vanessa Palmer has been taking her medications as prescribed. Other than that Vanessa Palmer's not having any problems at today's visit. Vanessa Palmer just wanted to followup.    Review of Systems  Constitutional: Negative for fever, chills, diaphoresis, activity change, appetite change, fatigue and unexpected weight change.  HENT: Negative.   Eyes: Negative.   Respiratory: Negative.   Cardiovascular: Negative.   Gastrointestinal: Positive for nausea and diarrhea. Negative for vomiting, abdominal pain, constipation, blood in stool, abdominal distention and rectal pain.       1-2 episodes of diarrhea per day, nausea companies.  Musculoskeletal: Negative.   Skin: Negative.   Neurological: Negative.  Negative for dizziness, tremors, syncope, weakness and light-headedness.  Hematological: Negative.   Psychiatric/Behavioral: Negative.     Vitals: The pressure: 117/78 Pulse: 70 Temperature: 97.40F Height 5 foot 2 inches Weight 246 Pounds    Objective:   Physical Exam  Constitutional: Vanessa Palmer is oriented to person, place, and time. Vanessa Palmer appears well-developed and well-nourished.  HENT:  Head: Normocephalic and atraumatic.    Eyes: EOM are normal. Pupils are equal, round, and reactive to light.  Neck: Normal range of motion. Neck supple. No tracheal deviation present. No thyromegaly present.  Cardiovascular: Normal rate, regular rhythm and normal heart sounds.   Pulmonary/Chest: Effort normal and breath sounds normal. No respiratory distress. Vanessa Palmer has no wheezes. Vanessa Palmer has no rales. Vanessa Palmer exhibits no tenderness.  Abdominal: Soft. Bowel sounds are normal. Vanessa Palmer exhibits no distension. There is no tenderness. There is no rebound and no guarding.  Musculoskeletal: Normal range of motion.  Lymphadenopathy:    Vanessa Palmer has no cervical adenopathy.  Neurological: Vanessa Palmer is alert and oriented to person, place, and time. No cranial nerve deficit.  Skin: Skin is warm and dry. No rash noted. No erythema. No pallor.  Psychiatric: Vanessa Palmer has a normal mood and affect. Her behavior is normal. Judgment and thought content normal.          Assessment & Plan:  1. Emergency room followup-patient was seen in the emergency room on Saturday. Her symptoms have since resolved, although Vanessa Palmer is still having some residual nausea. I have prescribed her some additional Zofran to control her nausea until his symptoms fully abate. Her diarrhea is down to 1-2 times per day and is continuing to get better. Vanessa Palmer is keeping down food and liquids. I did advise her to continue eating and to drink plenty of fluids. Vanessa Palmer does have an appointment scheduled in October, and I did advise her to keep that appointment. I told her that Vanessa Palmer is having future sickness Vanessa Palmer is free to call our office and be  seen sooner. No change to regimen at today's visit.  2. Other medical problems not addressed at today's visit: Hyperlipidemia, gout, obesity, OSA, hypertension, coronary artery disease, degenerative joint disease, headache, edema, CKD.  3. Disposition-patient will be seen back in October for a followup visit with her regular doctor Dr. Denton Meek. No change to her medications at  today's visit. I did prescribe Zofran for her at today's visit. I also refilled several of her medications today.

## 2010-11-15 ENCOUNTER — Other Ambulatory Visit: Payer: Self-pay | Admitting: *Deleted

## 2010-11-17 LAB — I-STAT 8, (EC8 V) (CONVERTED LAB)
Acid-Base Excess: 3 — ABNORMAL HIGH
Bicarbonate: 29.7 — ABNORMAL HIGH
Operator id: 294501
TCO2: 31
pCO2, Ven: 52.6 — ABNORMAL HIGH
pH, Ven: 7.36 — ABNORMAL HIGH

## 2010-11-17 LAB — POCT I-STAT CREATININE: Operator id: 294501

## 2010-11-17 LAB — POCT CARDIAC MARKERS
Operator id: 294501
Troponin i, poc: <0.05

## 2010-11-17 LAB — CBC
HCT: 39.5
MCHC: 34.5
MCV: 89.1
Platelets: 224
RDW: 13.5
WBC: 5.1

## 2010-11-17 LAB — DIFFERENTIAL
Basophils Relative: 1
Eosinophils Absolute: 0.1
Eosinophils Relative: 2
Lymphs Abs: 1.7

## 2010-11-17 MED ORDER — ONDANSETRON HCL 4 MG PO TABS: 4.0000 mg | ORAL_TABLET | Freq: Every day | ORAL | Status: DC | PRN

## 2010-11-21 LAB — CBC
HCT: 40.9
MCV: 87.3
Platelets: 250
RBC: 4.69
WBC: 6.4

## 2010-11-21 LAB — POCT I-STAT, CHEM 8
Calcium, Ion: 1.16
Creatinine, Ser: 1.7 — ABNORMAL HIGH
Glucose, Bld: 129 — ABNORMAL HIGH
Hemoglobin: 14.6
TCO2: 27

## 2010-11-21 LAB — DIFFERENTIAL
Eosinophils Absolute: 0.1
Eosinophils Relative: 2
Lymphs Abs: 1.8
Monocytes Relative: 4

## 2010-11-21 LAB — URINALYSIS, ROUTINE W REFLEX MICROSCOPIC
Glucose, UA: NEGATIVE
Ketones, ur: NEGATIVE
Leukocytes, UA: NEGATIVE
Protein, ur: 100 — AB

## 2010-11-23 LAB — DIFFERENTIAL
Basophils Relative: 1
Eosinophils Absolute: 0.2
Eosinophils Relative: 3
Monocytes Absolute: 0.4
Monocytes Relative: 6
Neutrophils Relative %: 66

## 2010-11-23 LAB — COMPREHENSIVE METABOLIC PANEL WITH GFR
ALT: 16
AST: 28
Albumin: 3.7
Alkaline Phosphatase: 89
BUN: 19
CO2: 26
Calcium: 9
Chloride: 102
Creatinine, Ser: 1.3 — ABNORMAL HIGH
GFR calc non Af Amer: 43 — ABNORMAL LOW
Glucose, Bld: 120 — ABNORMAL HIGH
Potassium: 4.4
Sodium: 136
Total Bilirubin: 1.3 — ABNORMAL HIGH
Total Protein: 8.4 — ABNORMAL HIGH

## 2010-11-23 LAB — BASIC METABOLIC PANEL
BUN: 14
Calcium: 9.1
GFR calc non Af Amer: 40 — ABNORMAL LOW
Glucose, Bld: 110 — ABNORMAL HIGH

## 2010-11-23 LAB — CBC
HCT: 37.9
HCT: 36.6
Hemoglobin: 12.9
Hemoglobin: 12.8
MCHC: 34.1
MCHC: 35
MCV: 87.1
MCV: 87.1
Platelets: 241
Platelets: 289
RBC: 4.35
RBC: 4.2
RDW: 13.6
RDW: 13.9
WBC: 4.7
WBC: 7.2

## 2010-11-23 LAB — POCT CARDIAC MARKERS
CKMB, poc: <1 — ABNORMAL LOW
Myoglobin, poc: 153
Operator id: 192351
Troponin i, poc: <0.05

## 2010-11-23 LAB — URINE MICROSCOPIC-ADD ON

## 2010-11-23 LAB — URINE CULTURE: Colony Count: >=100000

## 2010-11-23 LAB — URINALYSIS, ROUTINE W REFLEX MICROSCOPIC
Ketones, ur: NEGATIVE
Nitrite: NEGATIVE
Protein, ur: 30 — AB
Urobilinogen, UA: 1

## 2010-11-23 LAB — PROTIME-INR: INR: 1

## 2010-11-24 LAB — DIFFERENTIAL
Basophils Absolute: 0.1
Eosinophils Relative: 2
Lymphocytes Relative: 27
Lymphs Abs: 1.5
Neutro Abs: 3.7

## 2010-11-24 LAB — CBC
HCT: 37.2
Hemoglobin: 12.9
Platelets: 240
RDW: 13.1
WBC: 5.7

## 2010-11-24 LAB — BASIC METABOLIC PANEL
BUN: 14
Calcium: 8.9
GFR calc non Af Amer: 39 — ABNORMAL LOW
Potassium: 3.9
Sodium: 138

## 2010-11-24 LAB — URIC ACID: Uric Acid, Serum: 6.8

## 2010-11-29 ENCOUNTER — Encounter: Payer: Self-pay | Admitting: Internal Medicine

## 2010-11-29 ENCOUNTER — Ambulatory Visit (INDEPENDENT_AMBULATORY_CARE_PROVIDER_SITE_OTHER): Payer: Medicaid Other | Admitting: Internal Medicine

## 2010-11-29 VITALS — BP 108/79 | HR 58 | Temp 97.3°F | Wt 248.7 lb

## 2010-11-29 DIAGNOSIS — K5909 Other constipation: Secondary | ICD-10-CM

## 2010-11-29 DIAGNOSIS — K5903 Drug induced constipation: Secondary | ICD-10-CM

## 2010-11-29 DIAGNOSIS — T50904A Poisoning by unspecified drugs, medicaments and biological substances, undetermined, initial encounter: Secondary | ICD-10-CM

## 2010-11-29 LAB — CBC
MCHC: 33.8
RDW: 13.9

## 2010-11-29 LAB — CARDIAC PANEL(CRET KIN+CKTOT+MB+TROPI)
CK, MB: 0.8
Total CK: 105
Total CK: 108
Troponin I: 0.01

## 2010-11-29 LAB — BASIC METABOLIC PANEL
BUN: 14
Chloride: 102
GFR calc non Af Amer: 48 — ABNORMAL LOW
Potassium: 3.9
Sodium: 136

## 2010-11-29 NOTE — Progress Notes (Signed)
Subjective:    Patient ID: Vanessa Palmer, female    DOB: [birth year], 60 y.o.   MRN: [id number]  HPI 1. constipation on and off, relieved with Miralax OTC. Reprots  Satisfactory intake of fluid and fiber intake. Denies any bloating, abdominal pain, increase in flatulence, N/V, diarrhea since last visit, weight changes. Patient is not physically active. 2. bilateral LE pain due to swelling. Patient never obtained compression knee highs due to cost. Denies any calf pain, redness, SOB, CP, fever or chills.  Review of Systems  Constitutional: Negative.   HENT: Negative.   Eyes: Negative for blurred vision and double vision.  Respiratory: Negative for cough, hemoptysis, sputum production, shortness of breath and wheezing.   Cardiovascular: Positive for leg swelling. Negative for chest pain, palpitations, orthopnea, claudication and PND.  Gastrointestinal: Positive for constipation. Negative for heartburn, nausea, vomiting, abdominal pain, diarrhea, blood in stool and melena.  Genitourinary: Negative.   Musculoskeletal: Positive for back pain and joint pain. Negative for myalgias and falls.  Skin: Negative.   Neurological: Negative for dizziness, tremors, seizures and loss of consciousness.  Psychiatric/Behavioral: Positive for depression. Negative for suicidal ideas, hallucinations, memory loss and substance abuse. The patient is not nervous/anxious and does not have insomnia.    Review of Systems  Constitutional: Negative.   HENT: Negative.   Eyes: Negative for blurred vision and double vision.  Respiratory: Negative for cough, hemoptysis, sputum production, shortness of breath and wheezing.   Cardiovascular: Positive for leg swelling. Negative for chest pain, palpitations, orthopnea, claudication and PND.  Gastrointestinal: Positive for constipation. Negative for heartburn, nausea, vomiting, abdominal pain, diarrhea, blood in stool and melena.  Genitourinary: Negative.   Musculoskeletal:  Positive for back pain and joint pain. Negative for myalgias and falls.  Skin: Negative.   Neurological: Negative for dizziness, tremors, seizures and loss of consciousness.  Psychiatric/Behavioral: Positive for depression. Negative for suicidal ideas, hallucinations, memory loss and substance abuse. The patient is not nervous/anxious and does not have insomnia.        Objective:   Physical Exam Vitals: reviewed. General: alert, well-developed, and cooperative to examination.  Head: normocephalic and atraumatic.  Eyes: vision grossly intact, pupils equal, pupils round, pupils reactive to light, no injection and anicteric.  Mouth: pharynx pink and moist, no erythema, and no exudates.  Neck: supple, full ROM, no thyromegaly, no JVD, and no carotid bruits.  Lungs: normal respiratory effort, no accessory muscle use, normal breath sounds, no crackles, and no wheezes. Heart: normal rate, regular rhythm, no murmur, no gallop, and no rub.  Abdomen: soft, non-tender, normal bowel sounds, no distention, no guarding, no rebound tenderness, no hepatomegaly, and no splenomegaly.  Msk: no joint swelling, no joint warmth, and no redness over joints.  Pulses: 2+ DP/PT pulses bilaterally Extremities: No cyanosis, clubbing. Bilateral pedal pitting edema 2+/4; strong pulses bilaterally and no calf TTP bilaterally. Neurologic: alert & oriented X3, cranial nerves II-XII intact, strength normal in all extremities, sensation intact to light touch, and gait normal.  Skin: turgor normal and no rashes.  Psych: Oriented X3, memory intact for recent and remote, normally interactive, good eye contact, not anxious appearing, and not depressed appearing.          Assessment & Plan:  1. Constipation. Likely due to sedentary lifestyle, and anticholinergic side-effects of the meds taken. - Encouraged physical exercise daily; continue with high fiber diet and hydration;  -Miralax 17 gm PO daily -consider colonoscopy  if no improvement and/or worsened Sx. 2. PVD/insufficiency.  Patient was provided with a compression stockings from [facility].  -elevate LE's above heart level while being seated or supine -low salt diet.

## 2010-11-29 NOTE — Patient Instructions (Signed)
Please, follow up with the instructions on your diet and medication regimen. Please, follow up on as needed basis.

## 2010-11-30 LAB — COMPREHENSIVE METABOLIC PANEL
ALT: 13 U/L (ref 0–35)
AST: 20 U/L (ref 0–37)
Alkaline Phosphatase: 92 U/L (ref 39–117)
CO2: 27 mEq/L (ref 19–32)
Chloride: 105 mEq/L (ref 96–112)
GFR calc non Af Amer: 53 mL/min — ABNORMAL LOW (ref >60)
Glucose, Bld: 109 mg/dL — ABNORMAL HIGH (ref 70–99)
Potassium: 3.4 mEq/L — ABNORMAL LOW (ref 3.5–5.1)
Sodium: 138 mEq/L (ref 135–145)

## 2010-11-30 LAB — POCT I-STAT, CHEM 8
Calcium, Ion: 1.08 mmol/L — ABNORMAL LOW (ref 1.12–1.32)
Glucose, Bld: 126 mg/dL — ABNORMAL HIGH (ref 70–99)
HCT: 41 % (ref 36.0–46.0)
Hemoglobin: 13.9 g/dL (ref 12.0–15.0)
Potassium: 3.3 mEq/L — ABNORMAL LOW (ref 3.5–5.1)
TCO2: 25 mmol/L (ref 0–100)

## 2010-11-30 LAB — DIFFERENTIAL
Basophils Relative: 1 % (ref 0–1)
Eosinophils Absolute: 0.2 10*3/uL (ref 0.0–0.7)
Eosinophils Relative: 7 % — ABNORMAL HIGH (ref 0–5)
Monocytes Relative: 8 % (ref 3–12)
Neutrophils Relative %: 51 % (ref 43–77)

## 2010-11-30 LAB — CBC
Hemoglobin: 13.3 g/dL (ref 12.0–15.0)
Hemoglobin: 13.8 g/dL (ref 12.0–15.0)
MCHC: 32.9 g/dL (ref 30.0–36.0)
MCHC: 33.4 g/dL (ref 30.0–36.0)
MCHC: 33.9 g/dL (ref 30.0–36.0)
MCV: 89.5 fL (ref 78.0–100.0)
MCV: 90.3 fL (ref 78.0–100.0)
Platelets: 171 10*3/uL (ref 150–400)
Platelets: 174 10*3/uL (ref 150–400)
RBC: 4.36 MIL/uL (ref 3.87–5.11)
RDW: 14.7 % (ref 11.5–15.5)
RDW: 14.7 % (ref 11.5–15.5)

## 2010-11-30 LAB — BASIC METABOLIC PANEL
CO2: 26 mEq/L (ref 19–32)
CO2: 30 mEq/L (ref 19–32)
Calcium: 8.8 mg/dL (ref 8.4–10.5)
Calcium: 8.9 mg/dL (ref 8.4–10.5)
Chloride: 102 mEq/L (ref 96–112)
Chloride: 105 mEq/L (ref 96–112)
Creatinine, Ser: 1.32 mg/dL — ABNORMAL HIGH (ref 0.4–1.2)
Creatinine, Ser: 1.49 mg/dL — ABNORMAL HIGH (ref 0.4–1.2)
GFR calc Af Amer: 44 mL/min — ABNORMAL LOW (ref >60)
GFR calc Af Amer: 49 mL/min — ABNORMAL LOW (ref >60)
GFR calc non Af Amer: 36 mL/min — ABNORMAL LOW (ref >60)
GFR calc non Af Amer: 41 mL/min — ABNORMAL LOW (ref >60)
Glucose, Bld: 102 mg/dL — ABNORMAL HIGH (ref 70–99)
Glucose, Bld: 110 mg/dL — ABNORMAL HIGH (ref 70–99)
Potassium: 3.6 mEq/L (ref 3.5–5.1)
Sodium: 136 mEq/L (ref 135–145)
Sodium: 138 mEq/L (ref 135–145)

## 2010-11-30 LAB — URINALYSIS, MICROSCOPIC ONLY
Hgb urine dipstick: NEGATIVE
Leukocytes, UA: NEGATIVE
Nitrite: NEGATIVE
Protein, ur: 30 mg/dL — AB
Specific Gravity, Urine: 1.013 (ref 1.005–1.030)
Urobilinogen, UA: 1 mg/dL (ref 0.0–1.0)

## 2010-11-30 LAB — POCT CARDIAC MARKERS: CKMB, poc: 1.7 ng/mL (ref 1.0–8.0)

## 2010-11-30 LAB — CREATININE, URINE, RANDOM: Creatinine, Urine: 147 mg/dL

## 2010-11-30 LAB — URINALYSIS, ROUTINE W REFLEX MICROSCOPIC
Glucose, UA: NEGATIVE mg/dL
Ketones, ur: NEGATIVE mg/dL
Protein, ur: >300 mg/dL — AB

## 2010-11-30 LAB — STREP PNEUMONIAE URINARY ANTIGEN

## 2010-11-30 LAB — LEGIONELLA ANTIGEN, URINE: Legionella Antigen, Urine: NEGATIVE

## 2010-11-30 LAB — SODIUM, URINE, RANDOM: Sodium, Ur: 11 mEq/L

## 2010-11-30 LAB — MAGNESIUM: Magnesium: 1.8 mg/dL (ref 1.5–2.5)

## 2010-11-30 LAB — CULTURE, BLOOD (ROUTINE X 2)
Culture: NO GROWTH
Culture: NO GROWTH

## 2010-11-30 LAB — URINE MICROSCOPIC-ADD ON

## 2010-12-01 LAB — I-STAT 8, (EC8 V) (CONVERTED LAB)
Acid-Base Excess: 3 — ABNORMAL HIGH
Bicarbonate: 25 — ABNORMAL HIGH
Glucose, Bld: 123 — ABNORMAL HIGH
HCT: 42
Hemoglobin: 14.3
Operator id: 198171
Sodium: 134 — ABNORMAL LOW
TCO2: 26

## 2010-12-01 LAB — POCT CARDIAC MARKERS: CKMB, poc: <1 — ABNORMAL LOW

## 2010-12-01 LAB — POCT I-STAT CREATININE
Creatinine, Ser: 1.7 — ABNORMAL HIGH
Operator id: 198171

## 2010-12-08 ENCOUNTER — Observation Stay (HOSPITAL_COMMUNITY)
Admission: EM | Admit: 2010-12-08 | Discharge: 2010-12-10 | Disposition: A | Discharge status: Home / Self Care | Payer: Medicaid Other | Attending: Internal Medicine | Admitting: Internal Medicine

## 2010-12-08 DIAGNOSIS — I2510 Atherosclerotic heart disease of native coronary artery without angina pectoris: Secondary | ICD-10-CM | POA: Insufficient documentation

## 2010-12-08 DIAGNOSIS — I129 Hypertensive chronic kidney disease with stage 1 through stage 4 chronic kidney disease, or unspecified chronic kidney disease: Secondary | ICD-10-CM | POA: Insufficient documentation

## 2010-12-08 DIAGNOSIS — M109 Gout, unspecified: Secondary | ICD-10-CM | POA: Insufficient documentation

## 2010-12-08 DIAGNOSIS — F329 Major depressive disorder, single episode, unspecified: Secondary | ICD-10-CM | POA: Insufficient documentation

## 2010-12-08 DIAGNOSIS — F3289 Other specified depressive episodes: Secondary | ICD-10-CM | POA: Insufficient documentation

## 2010-12-08 DIAGNOSIS — N189 Chronic kidney disease, unspecified: Secondary | ICD-10-CM | POA: Insufficient documentation

## 2010-12-08 DIAGNOSIS — R0789 Other chest pain: Principal | ICD-10-CM | POA: Insufficient documentation

## 2010-12-08 DIAGNOSIS — E785 Hyperlipidemia, unspecified: Secondary | ICD-10-CM | POA: Insufficient documentation

## 2010-12-08 DIAGNOSIS — R0602 Shortness of breath: Secondary | ICD-10-CM | POA: Insufficient documentation

## 2010-12-08 DIAGNOSIS — G4733 Obstructive sleep apnea (adult) (pediatric): Secondary | ICD-10-CM | POA: Insufficient documentation

## 2010-12-08 DIAGNOSIS — J45909 Unspecified asthma, uncomplicated: Secondary | ICD-10-CM | POA: Insufficient documentation

## 2010-12-08 DIAGNOSIS — M1990 Unspecified osteoarthritis, unspecified site: Secondary | ICD-10-CM | POA: Insufficient documentation

## 2010-12-08 DIAGNOSIS — I252 Old myocardial infarction: Secondary | ICD-10-CM | POA: Insufficient documentation

## 2010-12-08 LAB — COMPREHENSIVE METABOLIC PANEL
ALT: 10 U/L (ref 0–35)
ALT: 13
AST: 24
CO2: 28
Calcium: 9.3 mg/dL (ref 8.4–10.5)
Chloride: 100
Creatinine, Ser: 1.89 mg/dL — ABNORMAL HIGH (ref 0.50–1.10)
GFR calc Af Amer: 33 mL/min — ABNORMAL LOW (ref >90)
GFR calc Af Amer: 47 — ABNORMAL LOW
GFR calc non Af Amer: 39 — ABNORMAL LOW
Glucose, Bld: 101 mg/dL — ABNORMAL HIGH (ref 70–99)
Sodium: 132 mEq/L — ABNORMAL LOW (ref 135–145)
Sodium: 137
Total Bilirubin: 1
Total Protein: 8.5 g/dL — ABNORMAL HIGH (ref 6.0–8.3)

## 2010-12-08 LAB — DIFFERENTIAL
Basophils Absolute: 0
Basophils Absolute: 0 10*3/uL (ref 0.0–0.1)
Eosinophils Absolute: 0.2
Eosinophils Absolute: 0.5 10*3/uL (ref 0.0–0.7)
Eosinophils Relative: 2
Eosinophils Relative: 9 % — ABNORMAL HIGH (ref 0–5)

## 2010-12-08 LAB — LIPASE, BLOOD: Lipase: 23

## 2010-12-08 LAB — URINALYSIS, ROUTINE W REFLEX MICROSCOPIC
Hgb urine dipstick: NEGATIVE
Nitrite: NEGATIVE
Specific Gravity, Urine: 1.016
Urobilinogen, UA: 0.2
pH: 7.5

## 2010-12-08 LAB — CK TOTAL AND CKMB (NOT AT ARMC)
CK, MB: 2.1 ng/mL (ref 0.3–4.0)
Relative Index: INVALID (ref 0.0–2.5)

## 2010-12-08 LAB — CBC
MCV: 87.9
MCV: 86.8 fL (ref 78.0–100.0)
Platelets: 227 10*3/uL (ref 150–400)
RBC: 4.3
RDW: 14.2 % (ref 11.5–15.5)
WBC: 6.7
WBC: 6.1 10*3/uL (ref 4.0–10.5)

## 2010-12-08 LAB — POCT I-STAT TROPONIN I

## 2010-12-09 ENCOUNTER — Observation Stay (HOSPITAL_COMMUNITY): Payer: Medicaid Other

## 2010-12-09 ENCOUNTER — Emergency Department (HOSPITAL_COMMUNITY): Payer: Medicaid Other

## 2010-12-09 ENCOUNTER — Encounter: Payer: Self-pay | Admitting: Internal Medicine

## 2010-12-09 LAB — CARDIAC PANEL(CRET KIN+CKTOT+MB+TROPI)
CK, MB: 1.9 ng/mL (ref 0.3–4.0)
Relative Index: INVALID (ref 0.0–2.5)
Total CK: 63 U/L (ref 7–177)
Total CK: 69 U/L (ref 7–177)
Troponin I: <0.3 ng/mL (ref <0.30)

## 2010-12-09 LAB — POCT I-STAT TROPONIN I: Troponin i, poc: 0 ng/mL (ref 0.00–0.08)

## 2010-12-09 MED ORDER — TECHNETIUM TO 99M ALBUMIN AGGREGATED
5.0000 | Freq: Once | INTRAVENOUS | Status: AC | PRN
  Administered 2010-12-09: 5 via INTRAVENOUS

## 2010-12-09 MED ORDER — XENON XE 133 GAS
11.0000 | GAS_FOR_INHALATION | Freq: Once | RESPIRATORY_TRACT | Status: AC | PRN
  Administered 2010-12-09: 11 via RESPIRATORY_TRACT

## 2010-12-09 NOTE — H&P (Signed)
Hospital Admission Note Date: 12/09/2010  Patient name: Vanessa Palmer Medical record number: [id number] Date of birth: 1951-02-07 Age: 60 y.o. Gender: female PCP: Deatra Robinson, MD  Medical Service: Int Med Teaching Service  Attending physician:  Campell  Pager: Resident Crist Infante):  Kalia-Reynolds Pager: [phone] Resident (R1):  Schooler  Pager: [phone]  Chief Complaint: Chest Pain  History of Present Illness: 60 year old woman with a history of hypertension, hyperlipidemia, multiple NSTEMI's (2004, 2005) who presents with chest pain. Patient had just gotten morphine in the emergency room prior to interview, and was notably fatigued and provided poor history.   Nonetheless, patient describes onset of chest pain with sitting in church today. She describes the pain as sharp and under her left breast, with left arm tingling but no jaw pain, no nausea, no vomiting, no diaphoresis. The pain was 9/10 and was not relieved by initial morphine or nitroglycerin. She reported some pain with inspiration. Patient also reports chest pain on and off since Thursday, sometimes while sitting and sometimes while walking, though this history was somewhat unclear. The patient had thought she might be having acid reflux or gas and took medicines for those.  Patient denies any shortness of breath, abdominal pain, bowel or bladder habit changes.  Past Medical History: Past Medical History  Diagnosis Date  . Hyperlipidemia 07/2010, LDL 106, TG 113, TC 151  . Hypertension -norvasc -coreg -cardizem -losartan -lasix  . Gout   . Obesity   . Depression Celexa, lamigtal  . DJD (degenerative joint disease)   . CAD (coronary artery disease) -NSTEMI in 2004, 2005 -cath in 2006 showed patent LAD, 10-15% dz in LCA, distal diffuse RCA dz. -Echo 09/2009 55-60%, no abnormalities    - Chronic kidney disease with baseline creatinine of about 1.2.  This       was previously thought to be around 2.2 and the patient was        following with Dr. Caryn Section in the past for this.   - Mild-to-moderate obstructive sleep apnea as per sleep study in       2007.  The patient is not using CPAP at this time.   - Asthma with PFTs done in August 2011.  Well controlled without       frequent exacerbations.  - GERD: on omeprazole   PAST SURGICAL HISTORY:  1.  Hysterectomy approximately 30+ years ago.  2.  Tubal ligation prior to hysterectomy.  Meds: Current Outpatient Prescriptions  Medication Sig Dispense Refill  . allopurinol (ZYLOPRIM) 100 MG tablet Take 1 tablet (100 mg total) by mouth daily.  30 tablet  11  . amLODipine (NORVASC) 10 MG tablet Take 10 mg by mouth daily.        Marland Kitchen aspirin 81 MG tablet Take 81 mg by mouth daily.        . carvedilol (COREG) 25 MG tablet Take 1 tablet (25 mg total) by mouth 2 (two) times daily with a meal.  60 tablet  11  . citalopram (CELEXA) 40 MG tablet Take 1 tablet (40 mg total) by mouth daily.  30 tablet  3  . diltiazem (CARDIZEM SR) 120 MG 12 hr capsule Take 1 capsule (120 mg total) by mouth 2 (two) times daily.  60 capsule  11  . Elastic Bandages & Supports (TRUFORM STOCKINGS 30-40MMHG) MISC Apply to both legs while standing or walking  2 each  1  . fesoterodine (TOVIAZ) 4 MG TB24 Take 4 mg by mouth daily.        Marland Kitchen  fluticasone (FLOVENT HFA) 44 MCG/ACT inhaler Inhale 1 puff into the lungs 2 (two) times daily.        . furosemide (LASIX) 20 MG tablet Take one half of the tablet once daily for 5 days. Then stop       . gabapentin (NEURONTIN) 300 MG capsule Take 300 mg by mouth 3 (three) times daily.        Marland Kitchen lamoTRIgine (LAMICTAL) 100 MG tablet Take 1 tablet (100 mg total) by mouth daily.  30 tablet  3  . losartan (COZAAR) 100 MG tablet Take 1 tablet (100 mg total) by mouth daily.  30 tablet  11  . nitroGLYCERIN (NITROSTAT) 0.3 MG SL tablet Place 0.3 mg under the tongue every 5 (five) minutes as needed.        Marland Kitchen omeprazole (PRILOSEC) 40 MG capsule Take 1 capsule (40 mg total) by mouth  daily.  30 capsule  11  . ondansetron (ZOFRAN) 4 MG tablet Take 1 tablet (4 mg total) by mouth daily as needed for nausea.  30 tablet  0  . pentosan polysulfate (ELMIRON) 100 MG capsule Take 100 mg by mouth 3 (three) times daily before meals.        . solifenacin (VESICARE) 10 MG tablet Take 5 mg by mouth daily.        . traMADol (ULTRAM) 50 MG tablet Take 1 tablet (50 mg total) by mouth every 6 (six) hours as needed.  120 tablet  11  . traZODone (DESYREL) 100 MG tablet Take 100 mg by mouth at bedtime.        Marland Kitchen DISCONTD: furosemide (LASIX) 20 MG tablet Take one half of the tablet once daily for 5 days. Then stop  10 tablet  0    Allergies: Ace inhibitors; Hydromorphone hcl; and Propoxyphene n-acetaminophen  Family Hx: Family History  Problem Relation Age of Onset  . Mental illness Mother   . Heart disease Father   . Diabetes Sister     Social Hx: History   Social History  . Marital Status: Single    Spouse Name: N/A    Number of Children: N/A  . Years of Education: N/A   Occupational History  . Not on file.   Social History Main Topics  . Smoking status: Never Smoker   . Smokeless tobacco: Not on file  . Alcohol Use: No  . Drug Use: No  . Sexually Active: Not on file   Other Topics Concern  . Not on file   Social History Narrative  . No narrative on file    Review of Systems: As per HPI  Physical Exam: VItal signs: 98.3 126/88  75  20  99%/RA GEN: No apparent distress.  Alert and oriented x 3.  Sleepy, conversant, and cooperative to exam. HEENT: head is autraumatic and normocephalic.  EOMI.  Sclerae anicteric.  Conjunctivae without pallor or injection. Mucous membranes are moist.  RESP:  Lungs are clear to ascultation bilaterally with good air movement.  No wheezes, ronchi, or rubs. CARDIOVASCULAR: regular rate, normal rhythm.  Clear S1, S2, no murmurs, gallops, or rubs. TTP over L sternal border ABDOMEN: soft, non-tender, non-distended.  Bowels sounds present  in all quadrants and normoactive.  No palpable masses. EXT: warm and dry.  Peripheral pulses equal, intact, and +2 globally.  No clubbing or cyanosis.  2+ pitting edema of the bl/ LE to mid calf, worse in L foot SKIN: warm and dry with normal turgor.  Hypopigmentation of the b/l  LE mixed w normal skin tone, creating a spotted appearance NEURO: sleepy, communicating appropriately when questioned directly.  Lab results: Basic Metabolic Panel:  Basename 12/08/10 2300  NA 132*  K 4.7  CL 97  CO2 27  GLUCOSE 101*  BUN 24*  CREATININE 1.89*  CALCIUM 9.3  MG --  PHOS --   Liver Function Tests:  Basename 12/08/10 2300  AST 15  ALT 10  ALKPHOS 150*  BILITOT 0.2*  PROT 8.5*  ALBUMIN 3.9   CBC:  Basename 12/08/10 2300  WBC 6.1  NEUTROABS 3.3  HGB 12.2  HCT 35.5*  MCV 86.8  PLT 227   Cardiac Enzymes:  Basename 12/08/10 2301  CKTOTAL 89  CKMB 2.1  CKMBINDEX --  TROPONINI --   Trop POC: 0.00 at 11pm and 1:30am  Imaging results:  Dg Chest 2 View  12/09/2010  *RADIOLOGY REPORT*  Clinical Data: Chest pain, shortness of breath, hypertension  CHEST - 2 VIEW  Comparison: 08/10/2010  Findings: Enlargement of cardiac silhouette. Tortuous aorta. Pulmonary vascularity normal. Lungs clear. Minimal chronic peribronchial thickening. No pleural effusion or pneumothorax. Bones unremarkable.  IMPRESSION: Enlargement of cardiac silhouette. Minimal chronic bronchitic changes. No acute abnormalities.  Original Report Authenticated By: Lollie Marrow, M.D.    Other results: EKG: poor tracing quality, NSR, no STE discernable  Assessment & Plan by Problem: #Chest Pain Patient presents with multiple risk factors (including prior NSTEMI's) and sharp chest pain while sitting in church. The pain has atypical features including reproducibility with chest wall palpation, sharpness, duration (constant all day without relief). However, the patient does report a questionable history of pain since  Thursday both with exertion and at rest. These pains could be due to other pathology such as GERD and constipation (the patient had been seen for this at last clinic visit). Nonetheless, escalating pain over these few days could be consistent with unstable angina, however the reproducibility, atypical nature, normal EKG, and negative troponins thus far make ACS much less likely. Moreover, the patient's last echo in 2011 and was essentially normal and catheterization in 2006 showed minimal disease. The patient is also low risk for PE (revised Geneva score of 3). Other etiologies include GERD (patient has known history) versus MSK (tender to palpation on chest wall). We will admit for cycle cardiac enzymes and repeat an EKG given his poor quality. -Telemetry -Cycle CE x3 if Positive, consider anticoagulation vs catheterisation. -12 lead EKG in AM. -Coreg -Will consider cardiology consult if Troponins are positive.  #AKI on CKD Patient presents with a creatinine of 1.89, above her baseline of roughly 1.2. CK D3 secondary to hypertension with poor antihypertensive compliance. The patient and has been fluctuating over the past 2 years, however the study is elevated from her last discharge value of 1.1 in June 2012. Possible etiologies for the AK I. include prerenal versus worsening intrarenal disease. We will start with mild fluid resuscitation and see how she responds - Normal saline at 125 cc per hour - Hold Lasix  #Hypertension - Continue outpatient meds  #Depression - Continue Celexa, Lamictal  #Prophylaxis - Lovenox     R2/3______________________________      R1________________________________  ATTENDING: I performed and/or observed a history and physical examination of the patient.  I discussed the case with the residents as noted and reviewed the residents' notes.  I agree with the findings and plan--please refer to the attending physician note for more  details.  Signature________________________________  Printed Name_____________________________

## 2010-12-10 DIAGNOSIS — M79609 Pain in unspecified limb: Secondary | ICD-10-CM

## 2010-12-10 DIAGNOSIS — R0602 Shortness of breath: Secondary | ICD-10-CM

## 2010-12-10 LAB — BASIC METABOLIC PANEL
Calcium: 8.8 mg/dL (ref 8.4–10.5)
GFR calc non Af Amer: 36 mL/min — ABNORMAL LOW (ref >90)
Sodium: 135 mEq/L (ref 135–145)

## 2010-12-10 LAB — CBC
MCHC: 33.9 g/dL (ref 30.0–36.0)
Platelets: 226 10*3/uL (ref 150–400)
RDW: 14.4 % (ref 11.5–15.5)
WBC: 4.9 10*3/uL (ref 4.0–10.5)

## 2010-12-18 ENCOUNTER — Ambulatory Visit (INDEPENDENT_AMBULATORY_CARE_PROVIDER_SITE_OTHER): Payer: Medicaid Other | Admitting: Internal Medicine

## 2010-12-18 ENCOUNTER — Encounter: Payer: Self-pay | Admitting: Internal Medicine

## 2010-12-18 VITALS — BP 107/69 | HR 56 | Temp 96.2°F | Ht 62.0 in | Wt 246.9 lb

## 2010-12-18 DIAGNOSIS — R0789 Other chest pain: Secondary | ICD-10-CM

## 2010-12-18 MED ORDER — HYDROCODONE-ACETAMINOPHEN 5-500 MG PO TABS: 1.0000 | ORAL_TABLET | Freq: Three times a day (TID) | ORAL | Status: DC | PRN

## 2010-12-18 MED ORDER — FUROSEMIDE 20 MG PO TABS: ORAL_TABLET | ORAL | Status: DC

## 2010-12-18 NOTE — Progress Notes (Deleted)
  Subjective:    Patient ID: Vanessa Palmer, female    DOB: 11/20/1950, 60 y.o.   MRN: [id number]  HPI    Review of Systems     Objective:   Physical Exam        Assessment & Plan:

## 2010-12-18 NOTE — Patient Instructions (Signed)
Please, pick up Vicodin prescription and take it as indicated. Please, do not operate any machinery when taking Vicodin. Please, stop taking Neuron tin, tramadol and Amlodipine. Start taking Furosemide for leg swelling as prescribed.

## 2010-12-20 NOTE — Discharge Summary (Signed)
Vanessa Palmer, Vanessa Palmer NO.:  [id number]  MEDICAL RECORD NO.:  [id number]  LOCATION:  3738                         FACILITY:  [facility]  PHYSICIAN:  Vanessa Palmer, M.D.  DATE OF BIRTH:  06/10/1950  DATE OF ADMISSION:  12/08/2010 DATE OF DISCHARGE:  12/10/2010                              DISCHARGE SUMMARY   DISCHARGE DIAGNOSES: 1. Atypical chest pain, likely musculoskeletal. 2. Acute kidney insufficiency on chronic kidney disease, with baseline     creatinine of 1.24. 3. Hyperlipidemia. 4. Hypertension. 5. Gout. 6. Depression. 7. Degenerative joint disease. 8. Coronary artery disease, status post non-ST elevation myocardial     infarction in 2004-2005, with non Q-wave myocardial infarction in     2004 and 2005, catheterization in 2006 showing minimal coronary     artery disease. 9. Obstructive sleep apnea, as per sleep study in 2007.  The patient     does not use CPAP at this time. 10.Asthma, with PFTs done in August 2011, well controlled without     acute exacerbations.  DISCHARGE MEDICATIONS: 1. Amlodipine 10 mg p.o. daily. 2. Diltiazem 120 mg capsules CD/ER p.o. b.i.d. 3. Nitroglycerin 0.5 mg sublingual tab under tongue every 5 minutes as     needed up to 3 doses for chest pain. 4. Acetaminophen 325 mg 1-2 tabs p.o. q.6 hours as needed for pain. 5. Albuterol inhaler 2 puffs inhaled every 4 hours as needed for     wheezing. 6. Allopurinol 300 mg p.o. daily. 7. Ambien 5 mg p.o. at bedtime for sleep. 8. Aspirin 81 mg p.o. daily. 9. Carvedilol 25 mg p.o. b.i.d. 10.Citalopram 40 mg p.o. daily. 11.Enablex 7.5 mg p.o. at bedtime. 12.Flovent 44 mcg 1 puff inhale b.i.d. 13.Gabapentin 300 mg p.o. t.i.d. 14.Lamotrigine 100 mg p.o. daily. 15.Losartan 100 mg p.o. daily. 16.Omeprazole 40 mg p.o. daily. 17.Tramadol 50 mg p.o. q.i.d. as needed for pain.  DISPOSITION AND FOLLOWUP:  The patient was discharged in stable condition with followup at the outpatient  clinic with Dr. Denton Palmer. Clinic will contact the patient for scheduled time.  PROCEDURE AND IMAGING: 1. December 09, 2010, chest x-ray demonstrated enlargement of the     cardiac silhouette, minimal chronic bronchitic changes, no acute     abnormalities. 2. December 09, 2010, V/Q scan demonstrated effusion within normal     limits, no medium or large size segmental perfusion defects     identified.  Normal ventilation.  Impression:  Very low probability     for acute pulmonary embolism.  CONSULTATIONS:  None.  BRIEF ADMITTING HISTORY:  Ms.  Palmer is a 60 year old female with a history of hypertension, hyperlipidemia, multiple NSTEMIs who presented to the emergency department with complaints of chest pain.  Chest pain has been intermittent over the past several days.  Most recently, she was sitting in church and chest pain began at rest with radiation to her left arm.  In the emergency department, the pain was not relieved by nitroglycerin and increased with inspiration.  The patient did gain some relief with GI cocktail of Mylanta and viscous lidocaine.  She denied shortness of breath, abdominal pain, nausea, or vomiting.  PHYSICAL EXAMINATION:  VITAL SIGNS:  Temperature 98.3, blood pressure 126/88, pulse 75 respirations 20, oxygen saturation 99% on room air. GENERAL:  She is in no apparent distress.  Alert and oriented x3, sleepy, conversant and cooperative on exam. HEAD AND NECK:  Atraumatic, normocephalic.  Extraocular muscles were intact.  Sclerae were anicteric.  Conjunctivae without pallor or injection.  Mucous membranes were moist. RESPIRATION:  Lungs were clear to auscultation bilaterally with good air movement.  No wheezes, rhonchi, or rubs. CARDIOVASCULAR:  Regular rate and rhythm. Clear S1, S2.  No murmurs, rubs, or gallops.  She was tender to palpation over the left sternal border. ABDOMEN:  Soft, nontender, nondistended.  Bowel sounds were present in all quadrants  and normoactive.  No palpable masses. EXTREMITIES:  Warm and dry.  Peripheral pulses equal and intact 2+ globally.  No clubbing or cyanosis, 2+ pitting edema of the bilateral lower extremities to the mid calf, worse in the left foot. SKIN:  Warm and dry.  Normal turgor. Hypopigmentation of the bilateral lower extremity mixed with normal skin tone creating a spotted appearance. NEUROLOGIC:  She was sleepy after receiving the morphine for pain, communicating appropriately when questioned directly.  LABORATORY DATA:  Basic metabolic panel showed sodium of 132, potassium 4.7, chloride 97, bicarb 27, BUN 24, creatinine 1.89, glucose 101, calcium 9.3.  Liver function tests:  She had an AST of 15, ALT of 10, alkaline phos 150, total bilirubin 0.2, total protein of 8.5, albumin 3.9.  CBC demonstrated a white blood cell count of 6.1, percent neutrophil 3.3, hemoglobin 12.2, hematocrit 35.5, MCV 86.8, platelets 227.  HOSPITAL COURSE: 1. For chest pain, the patient was admitted with chest pain in the     setting of multiple risk factors, including prior NSTEMI,     hyperlipidemia, and hypertension.  Ischemic etiology was deemed     less likely given serial EKGs without signs of ischemia, negative     troponins, hypertension, controlled, and no acute events on     telemetry.  The patient was continued on her outpatient regimen of     beta-blocker and calcium channel blockers, ARB, and analgesics.     She continued to complain of chest pain, which was reproducible on     palpitation along the entire sternal, clavicular, and pectoral     region.  During her hospital course, the pain was associated with     mild shortness of breath, unrelieved with sublingual nitroglycerin,     morphine 4 mg IV, and supplemental oxygen.  Her Geneva score for     pulmonary embolism increased from low to intermediate during     admission with her new complaints of lower extremity tenderness to     palpation on the  left leg.  Given her new symptoms and the D-dimer     elevation to 0.95, a V/Q scan was conducted which demonstrated low     probability of PE.  CT of the chest was deferred due to patient's     creatinine level of 1.89 above her baseline of 1.2.  Further workup     with lower extremity Dopplers demonstrated no evidence of DVT given     atypical nature of her chest pain, is likely secondary to     musculoskeletal etiology given her symptoms and exam findings.  We     deferred the use of NSAIDs given her acute kidney insufficiency.     The patient was discharged in stable condition with prior     outpatient regimen  of all her medications including tramadol 50 mg     q.i.d. as needed for pain.  Neurontin 300 mg t.i.d. a day and     acetaminophen 325 mg 1-2 tabs p.o. q.6 hours as needed for pain.     The patient was given instructions to be re-evaluated by the     emergency department if symptoms of severe chest pain, nausea,     vomiting or other signs of heart attack occurred. 2. Her hypertension remained stable, and she was continued on her     outpatient regimen of amlodipine, diltiazem, carvedilol, and     losartan. 3. Her gout was stable on her outpatient regimen of allopurinol 200 mg     daily. 4. Her depression remains stable on citalopram 40 daily and Ambien 5     mg daily at bedtime p.r.n. in addition to trazodone 100 mg p.o.     daily at bedtime. 5. Asthma remained stable on Flovent b.i.d. and albuterol p.r.n.  DISCHARGE VITAL SIGNS:  Temperature 98.4, pulse 64, respirations 15, blood pressure 118/75, oxygen saturation 94% on room air.  DISCHARGE LABORATORY DATA:  Basic metabolic panel at discharge showed a sodium of 135, potassium 4.7, chloride 101, bicarb 27, glucose 96, BUN 21, creatinine 1.54, GFR 42, calcium 8.8.  CBC demonstrated white count of 4.9, hemoglobin 10.3, hematocrit 30.4, MCV 86.9, and platelets of 226.  Last set of cardiac enzymes were negative for ischemia  with a CK- MB of 1.9, troponin less than 0.3.    ______________________________ Kristie Cowman, MD   ______________________________ Vanessa Palmer, M.D.    KS/MEDQ  D:  12/11/2010  T:  12/12/2010  Job:  [id number]  cc:   Deatra Robinson, MD  Electronically Signed by Kristie Cowman MD on 12/14/2010 02:01:00 PM Electronically Signed by Lina Sayre M.D. on 12/20/2010 04:00:57 PM

## 2010-12-21 ENCOUNTER — Encounter: Payer: Self-pay | Admitting: Internal Medicine

## 2010-12-21 NOTE — Progress Notes (Signed)
HPI: 1. Hospital follow up. Please, refer to a Hospital D/C summary.  Past Medical History  Diagnosis Date  . Hyperlipidemia   . Hypertension   . Gout   . Obesity   . Depression   . DJD (degenerative joint disease)   . OSA (obstructive sleep apnea)   . CAD (coronary artery disease)    Current Outpatient Prescriptions  Medication Sig Dispense Refill  . allopurinol (ZYLOPRIM) 100 MG tablet Take 1 tablet (100 mg total) by mouth daily.  30 tablet  11  . aspirin 81 MG tablet Take 81 mg by mouth daily.        . carvedilol (COREG) 25 MG tablet Take 1 tablet (25 mg total) by mouth 2 (two) times daily with a meal.  60 tablet  11  . citalopram (CELEXA) 40 MG tablet Take 1 tablet (40 mg total) by mouth daily.  30 tablet  3  . diltiazem (CARDIZEM SR) 120 MG 12 hr capsule Take 1 capsule (120 mg total) by mouth 2 (two) times daily.  60 capsule  11  . Elastic Bandages & Supports (TRUFORM STOCKINGS 30-40MMHG) MISC Apply to both legs while standing or walking  2 each  1  . fesoterodine (TOVIAZ) 4 MG TB24 Take 4 mg by mouth daily.        . fluticasone (FLOVENT HFA) 44 MCG/ACT inhaler Inhale 1 puff into the lungs 2 (two) times daily.        . furosemide (LASIX) 20 MG tablet Take one half of the tablet once daily as needed for swelling in your legs. Please, stop if start feeling weak or dizzy.  30 tablet  11  . HYDROcodone-acetaminophen (VICODIN) 5-500 MG per tablet Take 1 tablet by mouth every 8 (eight) hours as needed for pain.  90 tablet  3  . lamoTRIgine (LAMICTAL) 100 MG tablet Take 1 tablet (100 mg total) by mouth daily.  30 tablet  3  . losartan (COZAAR) 100 MG tablet Take 1 tablet (100 mg total) by mouth daily.  30 tablet  11  . nitroGLYCERIN (NITROSTAT) 0.3 MG SL tablet Place 0.3 mg under the tongue every 5 (five) minutes as needed.        Marland Kitchen omeprazole (PRILOSEC) 40 MG capsule Take 1 capsule (40 mg total) by mouth daily.  30 capsule  11  . ondansetron (ZOFRAN) 4 MG tablet Take 1 tablet (4 mg  total) by mouth daily as needed for nausea.  30 tablet  0  . DISCONTD: furosemide (LASIX) 20 MG tablet Take one half of the tablet once daily for 5 days. Then stop  10 tablet  0   Family History  Problem Relation Age of Onset  . Mental illness Mother   . Heart disease Father   . Diabetes Sister    History   Social History  . Marital Status: Single    Spouse Name: N/A    Number of Children: N/A  . Years of Education: N/A   Social History Main Topics  . Smoking status: Never Smoker   . Smokeless tobacco: None  . Alcohol Use: No  . Drug Use: No  . Sexually Active: None   Other Topics Concern  . None   Social History Narrative  . None    Review of Systems: Constitutional: Denies fever, chills, diaphoresis, appetite change and fatigue.  HEENT: Denies photophobia, eye pain, redness, hearing loss, ear pain, congestion, sore throat, rhinorrhea, sneezing, mouth sores, trouble swallowing, neck pain, neck stiffness and tinnitus.  Respiratory: Denies SOB, DOE, cough, chest tightness, and wheezing.  Cardiovascular: Denies chest pain, palpitations and leg swelling.  Gastrointestinal: Denies nausea, vomiting, abdominal pain, diarrhea, constipation, blood in stool and abdominal distention.  Genitourinary: Denies dysuria, urgency, frequency, hematuria, flank pain and difficulty urinating.  Musculoskeletal: Denies myalgias, back pain, joint swelling, arthralgias and gait problem.  Skin: Denies pallor, rash and wound.  Neurological: Denies dizziness, seizures, syncope, weakness, light-headedness, numbness and headaches.  Hematological: Denies adenopathy. Easy bruising, personal or family bleeding history  Psychiatric/Behavioral: Denies suicidal ideation, mood changes, confusion, nervousness, sleep disturbance and agitation   Vitals: reviewed General: alert, well-developed, and cooperative to examination.  Head: normocephalic and atraumatic.  Eyes: vision grossly intact, pupils equal,  pupils round, pupils reactive to light, no injection and anicteric.  Mouth: pharynx pink and moist, no erythema, and no exudates.  Neck: supple, full ROM, no thyromegaly, no JVD, and no carotid bruits.  Lungs: normal respiratory effort, no accessory muscle use, normal breath sounds, no crackles, and no wheezes. Heart: normal rate, regular rhythm, no murmur, no gallop, and no rub.  Abdomen: soft, non-tender, normal bowel sounds, no distention, no guarding, no rebound tenderness, no hepatomegaly, and no splenomegaly.  Msk: no joint swelling, no joint warmth, and no redness over joints.  Pulses: 2+ DP/PT pulses bilaterally Extremities: No cyanosis, clubbing, edema Neurologic: alert & oriented X3, cranial nerves II-XII intact, strength normal in all extremities, sensation intact to light touch, and gait normal.  Skin: turgor normal and no rashes.  Psych: Oriented X3, memory intact for recent and remote, normally interactive, good eye contact, not anxious appearing, and not depressed appearing.    Assessment & Plan:  1.Hospiral follow up for atypical CP -all work up is negative -patient is asymptomatic -Continue with current regimen -call with any questions and follow up in 4 weeks.

## 2011-01-01 ENCOUNTER — Other Ambulatory Visit: Payer: Self-pay | Admitting: Internal Medicine

## 2011-01-01 DIAGNOSIS — Z1231 Encounter for screening mammogram for malignant neoplasm of breast: Secondary | ICD-10-CM

## 2011-01-22 ENCOUNTER — Other Ambulatory Visit: Payer: Self-pay | Admitting: *Deleted

## 2011-01-22 MED ORDER — FLUTICASONE PROPIONATE HFA 44 MCG/ACT IN AERO: 1.0000 | INHALATION_SPRAY | Freq: Two times a day (BID) | RESPIRATORY_TRACT | Status: DC

## 2011-02-08 ENCOUNTER — Ambulatory Visit (HOSPITAL_COMMUNITY)
Admission: RE | Admit: 2011-02-08 | Discharge: 2011-02-08 | Disposition: A | Discharge status: Home / Self Care | Payer: Medicaid Other | Source: Ambulatory Visit | Attending: Family Medicine | Admitting: Family Medicine

## 2011-02-08 DIAGNOSIS — Z1231 Encounter for screening mammogram for malignant neoplasm of breast: Secondary | ICD-10-CM | POA: Insufficient documentation

## 2011-02-23 ENCOUNTER — Telehealth: Payer: Self-pay | Admitting: *Deleted

## 2011-02-23 NOTE — Telephone Encounter (Signed)
Call from pt asking about results of Mammogram.  Results were given to pt as normal and to follow up in 1 year.  Pt voiced understanding of plan.  Angelina Ok, RN 02/23/2011 3:47 pm.

## 2011-03-09 DIAGNOSIS — R0602 Shortness of breath: Secondary | ICD-10-CM | POA: Diagnosis not present

## 2011-03-20 ENCOUNTER — Telehealth: Payer: Self-pay | Admitting: *Deleted

## 2011-03-20 NOTE — Telephone Encounter (Signed)
Pt calls and states she needs a shot for pain in her low back and knees scale 9/10, appt is made for 1/23 at 1315 dr Loistine Chance

## 2011-03-21 ENCOUNTER — Ambulatory Visit (INDEPENDENT_AMBULATORY_CARE_PROVIDER_SITE_OTHER): Payer: Medicare Other | Admitting: Internal Medicine

## 2011-03-21 ENCOUNTER — Encounter: Payer: Self-pay | Admitting: Internal Medicine

## 2011-03-21 VITALS — BP 130/80 | HR 66 | Temp 97.4°F | Ht 62.0 in | Wt 261.2 lb

## 2011-03-21 DIAGNOSIS — M549 Dorsalgia, unspecified: Secondary | ICD-10-CM

## 2011-03-21 DIAGNOSIS — M25569 Pain in unspecified knee: Secondary | ICD-10-CM

## 2011-03-21 DIAGNOSIS — G8929 Other chronic pain: Secondary | ICD-10-CM

## 2011-03-21 DIAGNOSIS — M25562 Pain in left knee: Secondary | ICD-10-CM

## 2011-03-21 MED ORDER — ACETAMINOPHEN 325 MG PO TABS: 650.0000 mg | ORAL_TABLET | Freq: Four times a day (QID) | ORAL | Status: DC | PRN

## 2011-03-21 NOTE — Progress Notes (Signed)
Subjective:   Patient ID: Vanessa Palmer female   DOB: 1950/05/25 61 y.o.   MRN: [id number]  HPI: [name] is a 61 y.o. female with past medical history significant as outlined below who presented to the clinic with back pain and knee pain. Patient reports that the pain is worsening significantly and she can hardly move. she noted shooting pain down her leg, weakness and numbness but denies fevers or chills, rashes , changes in urinary or bowel habits. Denies any injury or trauma. Was prescribed Vicodin which caused her to have itching and therefore it was stopped.  She wants a shot in the clinic.    Past Medical History  Diagnosis Date  . Hyperlipidemia   . Hypertension   . Gout   . Obesity   . Depression   . DJD (degenerative joint disease)   . OSA (obstructive sleep apnea)   . CAD (coronary artery disease)    Current Outpatient Prescriptions  Medication Sig Dispense Refill  . allopurinol (ZYLOPRIM) 100 MG tablet Take 1 tablet (100 mg total) by mouth daily.  30 tablet  11  . aspirin 81 MG tablet Take 81 mg by mouth daily.        . carvedilol (COREG) 25 MG tablet Take 1 tablet (25 mg total) by mouth 2 (two) times daily with a meal.  60 tablet  11  . citalopram (CELEXA) 40 MG tablet Take 1 tablet (40 mg total) by mouth daily.  30 tablet  3  . diltiazem (CARDIZEM SR) 120 MG 12 hr capsule Take 1 capsule (120 mg total) by mouth 2 (two) times daily.  60 capsule  11  . Elastic Bandages & Supports (TRUFORM STOCKINGS 30-40MMHG) MISC Apply to both legs while standing or walking  2 each  1  . fesoterodine (TOVIAZ) 4 MG TB24 Take 4 mg by mouth daily.        . fluticasone (FLOVENT HFA) 44 MCG/ACT inhaler Inhale 1 puff into the lungs 2 (two) times daily.  1 Inhaler  6  . furosemide (LASIX) 20 MG tablet Take one half of the tablet once daily as needed for swelling in your legs. Please, stop if start feeling weak or dizzy.  30 tablet  11  . HYDROcodone-acetaminophen (VICODIN) 5-500 MG per  tablet Take 1 tablet by mouth every 8 (eight) hours as needed for pain.  90 tablet  3  . lamoTRIgine (LAMICTAL) 100 MG tablet Take 1 tablet (100 mg total) by mouth daily.  30 tablet  3  . losartan (COZAAR) 100 MG tablet Take 1 tablet (100 mg total) by mouth daily.  30 tablet  11  . nitroGLYCERIN (NITROSTAT) 0.3 MG SL tablet Place 0.3 mg under the tongue every 5 (five) minutes as needed.        Marland Kitchen omeprazole (PRILOSEC) 40 MG capsule Take 1 capsule (40 mg total) by mouth daily.  30 capsule  11  . ondansetron (ZOFRAN) 4 MG tablet Take 1 tablet (4 mg total) by mouth daily as needed for nausea.  30 tablet  0  . DISCONTD: furosemide (LASIX) 20 MG tablet Take one half of the tablet once daily for 5 days. Then stop  10 tablet  0   Family History  Problem Relation Age of Onset  . Mental illness Mother   . Heart disease Father   . Diabetes Sister    History   Social History  . Marital Status: Single    Spouse Name: N/A  Number of Children: N/A  . Years of Education: N/A   Social History Main Topics  . Smoking status: Never Smoker   . Smokeless tobacco: None  . Alcohol Use: No  . Drug Use: No  . Sexually Active: None   Other Topics Concern  . None   Social History Narrative  . None   Review of Systems: Constitutional: Denies fever, chills, diaphoresis, appetite change but  fatigue.  Respiratory: Denies SOB, DOE, cough, chest tightness,  and wheezing.   Cardiovascular: Denies chest pain, palpitations and leg swelling.  Gastrointestinal: Denies nausea, vomiting,  diarrhea,  Genitourinary: Denies dysuria, urgency, frequency, hematuria, flank pain and difficulty urinating.  Musculoskeletal: Noted myalgias, back pain, joint swelling, arthralgias and gait problem.  Skin: Denies pallor, rash and wound.  Psychiatric/Behavioral: Denies suicidal ideation.  Objective:  Physical Exam: Filed Vitals:   03/21/11 1320  BP: 130/80  Pulse: 66  Temp: 97.4 F (36.3 C)  TempSrc: Oral  Height:  5\' 2"  (1.575 m)  Weight: 261 lb 3.2 oz (118.48 kg)  SpO2: 97%   Constitutional: Vital signs reviewed.  Patient is obese,  in no acute distress and cooperative with exam. Alert and oriented x3.  Neck: Supple,  Cardiovascular: RRR, S1 normal, S2 normal,  pulses symmetric and intact bilaterally Pulmonary/Chest: CTAB, no wheezes, rales, or rhonchi Abdominal: Soft. Non-tender, non-distended, bowel sounds are normal,   GU: no CVA tenderness Musculoskeletal: Back: significant pain on palpation throughout from thoracic through sacrial. No rashes, no erythema. Not able to perform ROM due to pain. Straight leg raise test negative.  Knee, significantly obese legs : bilateral tender to palpation throughout occasionally distractible. Not able to access ROM . No stiffness, no erythema. No joint deformities.  Neurological: A&O x3, Strenght is normal and symmetric bilaterally, sensory intact to light touch bilaterally.  Skin: Warm, dry and intact. No rash, cyanosis, or clubbing.  Psychiatric: Very tearfull.

## 2011-03-22 NOTE — Assessment & Plan Note (Signed)
Likely MSK but also psychosomatic. I d/c vicodin since patient was experiencing itching. Patient can not be prescribed at this point NSAID due to CKD. Nor red flags noted. No neurological finding noted on physical exam. I will refer patient to PT for further evaluation and management. I prescribed Tylenol for pain relieve. If patient is noted to have worsening back pain with neurological findings consider to imagine since none was performed in the past.

## 2011-03-22 NOTE — Assessment & Plan Note (Addendum)
Likely mild degenerative disease due to sever obesity and possible psychosomatic pain . Patient seems to have history of pain at multiple sides. Unlikely gout.  No imagining studies in records and I did not obtain any today.   I d/c vicodin since patient was experiencing itching. Patient can not be prescribed at this point NSAID due to CKD. Nor red flags noted. No neurological finding noted on physical exam. I will refer patient to PT for further evaluation and management. I prescribed Tylenol for pain relieve.

## 2011-03-27 ENCOUNTER — Telehealth: Payer: Self-pay | Admitting: Internal Medicine

## 2011-03-27 DIAGNOSIS — M549 Dorsalgia, unspecified: Secondary | ICD-10-CM

## 2011-03-27 NOTE — Assessment & Plan Note (Deleted)
I received paperwork from the patient's pharmacy regarding bilateral knee braces and a back brace. The patient would like these products, and the pharmacy wanted Medicaid information in order to prescribe them. This is inappropriate, as the patient was referred to physical therapy last week by Dr. Illath, and it is at physical therapy where the evaluation for necessity of such braces should be made. Writing prescriptions and filing paperwork for braces is not warranted simply because the patient wants them.  I called the patient to discuss this with her, and she understands that she should voice her desire for braces at her physical therapy appointment, and they can decide together if these are medically necessary. If such braces are necessary, we are happy to write prescriptions and fill out paperwork for them.  The referral for physical therapy was filed on 03/23/11, and the patient says she has not yet heard from physical therapy. I told her that if she does not hear from them in the next couple of days, that she should call back to clinic and we will look into it for her. 

## 2011-03-27 NOTE — Telephone Encounter (Signed)
I received paperwork from the patient's pharmacy regarding bilateral knee braces and a back brace. The patient would like these products, and the pharmacy wanted Medicaid information in order to prescribe them. This is inappropriate, as the patient was referred to physical therapy last week by Dr. Loistine Chance, and it is at physical therapy where the evaluation for necessity of such braces should be made. Writing prescriptions and filing paperwork for braces is not warranted simply because the patient wants them.  I called the patient to discuss this with her, and she understands that she should voice her desire for braces at her physical therapy appointment, and they can decide together if these are medically necessary. If such braces are necessary, we are happy to write prescriptions and fill out paperwork for them.  The referral for physical therapy was filed on 03/23/11, and the patient says she has not yet heard from physical therapy. I told her that if she does not hear from them in the next couple of days, that she should call back to clinic and we will look into it for her.

## 2011-03-28 ENCOUNTER — Encounter: Payer: Medicaid Other | Admitting: Internal Medicine

## 2011-04-06 ENCOUNTER — Telehealth: Payer: Self-pay | Admitting: *Deleted

## 2011-04-06 NOTE — Telephone Encounter (Signed)
Received faxed PA request for pt's flovent inhaler from [facility].  Contacted pt to see if she has tried any other meds.  She states that she did not have any problems getting her rx filled and thought that it was because she has a Medicare D plan in addition to the medicaid.  I contacted [facility] at [phone] to inquire about the PA and was told that it did not appear one was needed.  Pharmacist recommended we wait until next time pt's rx is due to see what happens.  Instructed pt to not run completely out of medication before asking for a refill as if may take a few days to complete a PA if one is needed for next time. Phone call complete.Kingsley Spittle Cassady2/8/20132:54 PM

## 2011-04-10 ENCOUNTER — Ambulatory Visit: Payer: Medicaid Other | Admitting: Physical Therapy

## 2011-04-17 ENCOUNTER — Ambulatory Visit: Payer: Medicare Other | Attending: Internal Medicine | Admitting: Physical Therapy

## 2011-04-17 DIAGNOSIS — M2569 Stiffness of other specified joint, not elsewhere classified: Secondary | ICD-10-CM | POA: Insufficient documentation

## 2011-04-17 DIAGNOSIS — M545 Low back pain, unspecified: Secondary | ICD-10-CM | POA: Insufficient documentation

## 2011-04-17 DIAGNOSIS — IMO0001 Reserved for inherently not codable concepts without codable children: Secondary | ICD-10-CM | POA: Diagnosis not present

## 2011-04-17 DIAGNOSIS — R262 Difficulty in walking, not elsewhere classified: Secondary | ICD-10-CM | POA: Diagnosis not present

## 2011-04-17 DIAGNOSIS — M79609 Pain in unspecified limb: Secondary | ICD-10-CM | POA: Insufficient documentation

## 2011-05-01 ENCOUNTER — Ambulatory Visit (INDEPENDENT_AMBULATORY_CARE_PROVIDER_SITE_OTHER): Payer: Medicare Other | Admitting: Internal Medicine

## 2011-05-01 ENCOUNTER — Encounter: Payer: Self-pay | Admitting: Internal Medicine

## 2011-05-01 VITALS — BP 105/71 | HR 55 | Temp 96.9°F | Ht 62.0 in | Wt 254.0 lb

## 2011-05-01 DIAGNOSIS — N189 Chronic kidney disease, unspecified: Secondary | ICD-10-CM

## 2011-05-01 DIAGNOSIS — E785 Hyperlipidemia, unspecified: Secondary | ICD-10-CM

## 2011-05-01 DIAGNOSIS — Z79899 Other long term (current) drug therapy: Secondary | ICD-10-CM

## 2011-05-01 DIAGNOSIS — I2510 Atherosclerotic heart disease of native coronary artery without angina pectoris: Secondary | ICD-10-CM

## 2011-05-01 DIAGNOSIS — Z8679 Personal history of other diseases of the circulatory system: Secondary | ICD-10-CM | POA: Diagnosis not present

## 2011-05-01 DIAGNOSIS — M1990 Unspecified osteoarthritis, unspecified site: Secondary | ICD-10-CM

## 2011-05-01 DIAGNOSIS — N182 Chronic kidney disease, stage 2 (mild): Secondary | ICD-10-CM | POA: Diagnosis not present

## 2011-05-01 DIAGNOSIS — F329 Major depressive disorder, single episode, unspecified: Secondary | ICD-10-CM | POA: Diagnosis not present

## 2011-05-01 DIAGNOSIS — M1710 Unilateral primary osteoarthritis, unspecified knee: Secondary | ICD-10-CM

## 2011-05-01 DIAGNOSIS — K219 Gastro-esophageal reflux disease without esophagitis: Secondary | ICD-10-CM

## 2011-05-01 MED ORDER — ACETAMINOPHEN-CODEINE 300-30 MG PO TABS: 1.0000 | ORAL_TABLET | Freq: Four times a day (QID) | ORAL | Status: DC | PRN

## 2011-05-01 MED ORDER — METOPROLOL TARTRATE 25 MG PO TABS: 25.0000 mg | ORAL_TABLET | Freq: Two times a day (BID) | ORAL | Status: DC

## 2011-05-01 MED ORDER — OMEPRAZOLE 40 MG PO CPDR: DELAYED_RELEASE_CAPSULE | ORAL | Status: DC

## 2011-05-01 NOTE — Progress Notes (Signed)
Addended by: Denna Haggard on: 05/01/2011 04:32 PM   Modules accepted: Orders

## 2011-05-01 NOTE — Patient Instructions (Addendum)
Please, take all your medications as prescribed. Please, STOP taking diltiazem. Please, note a change in the dose of metorporlol: it is 25 mg by mouth twice a day now. Please, do not operate any machinery while taking Tyleno #3 for pain! Please, return in 4 weeks for a blood pressure recheck.

## 2011-05-01 NOTE — Progress Notes (Signed)
Patient ID: Vanessa Palmer, female   DOB: February 06, 1951, 61 y.o.   MRN: [id number]  Subjective:   Patient ID: Vanessa Palmer female   DOB: 1950/10/29 61 y.o.   MRN: [id number]  HPI: Vanessa Palmer is a 61 y.o. woman is here for:  1. Bilateral knee pain. Developed hives and severe itching with Vicodin. Failed NSAIDs, tramadol and topical analgesics. Requests "the same medication that her dentist gave to her recently." bilateral knee pain is 9/10 in intensity, constant, unable to walk due to pain. No radiculopathy, fever, chills or any other new Sx. 2. Depression. Doing better. Denies Si/HI or mania. Compliant with her anti-depressant therapy.    Past Medical History  Diagnosis Date  . Hyperlipidemia   . Hypertension   . Gout   . Obesity   . Depression   . DJD (degenerative joint disease)   . OSA (obstructive sleep apnea)   . CAD (coronary artery disease)    Current Outpatient Prescriptions  Medication Sig Dispense Refill  . acetaminophen (TYLENOL) 325 MG tablet Take 2 tablets (650 mg total) by mouth every 6 (six) hours as needed for pain.  180 tablet  1  . allopurinol (ZYLOPRIM) 100 MG tablet Take 1 tablet (100 mg total) by mouth daily.  30 tablet  11  . aspirin 81 MG tablet Take 81 mg by mouth daily.        . carvedilol (COREG) 25 MG tablet Take 1 tablet (25 mg total) by mouth 2 (two) times daily with a meal.  60 tablet  11  . citalopram (CELEXA) 40 MG tablet Take 1 tablet (40 mg total) by mouth daily.  30 tablet  3  . diltiazem (CARDIZEM SR) 120 MG 12 hr capsule Take 1 capsule (120 mg total) by mouth 2 (two) times daily.  60 capsule  11  . Elastic Bandages & Supports (TRUFORM STOCKINGS 30-40MMHG) MISC Apply to both legs while standing or walking  2 each  1  . fesoterodine (TOVIAZ) 4 MG TB24 Take 4 mg by mouth daily.        . fluticasone (FLOVENT HFA) 44 MCG/ACT inhaler Inhale 1 puff into the lungs 2 (two) times daily.  1 Inhaler  6  . furosemide (LASIX) 20 MG tablet Take one  half of the tablet once daily as needed for swelling in your legs. Please, stop if start feeling weak or dizzy.  30 tablet  11  . lamoTRIgine (LAMICTAL) 100 MG tablet Take 1 tablet (100 mg total) by mouth daily.  30 tablet  3  . losartan (COZAAR) 100 MG tablet Take 1 tablet (100 mg total) by mouth daily.  30 tablet  11  . nitroGLYCERIN (NITROSTAT) 0.3 MG SL tablet Place 0.3 mg under the tongue every 5 (five) minutes as needed.        Marland Kitchen omeprazole (PRILOSEC) 40 MG capsule Take 1 capsule (40 mg total) by mouth daily.  30 capsule  11  . DISCONTD: furosemide (LASIX) 20 MG tablet Take one half of the tablet once daily for 5 days. Then stop  10 tablet  0   Family History  Problem Relation Age of Onset  . Mental illness Mother   . Heart disease Father   . Diabetes Sister    History   Social History  . Marital Status: Single    Spouse Name: N/A    Number of Children: N/A  . Years of Education: N/A   Social History Main Topics  . Smoking status:  Never Smoker   . Smokeless tobacco: None  . Alcohol Use: No  . Drug Use: No  . Sexually Active: None   Other Topics Concern  . None   Social History Narrative  . None    Objective:  Physical Exam: Filed Vitals:   05/01/11 1504  BP: 105/71  Pulse: 55  Temp: 96.9 F (36.1 C)  TempSrc: Oral  Height: 5\' 2"  (1.575 m)  Weight: 254 lb (115.214 kg)  SpO2: 95%   Constitutional: Vital signs reviewed.  Patient is a well-developed and well-nourished woman in no acute distress and cooperative with exam. Alert and oriented x3.  Head: atraumatic Mouth: no erythema or exudates, MMM Eyes:  conjunctivae normal, No scleral icterus.  Neck: Supple, Trachea midline normal ROM, No JVD, mass, thyromegaly, or carotid bruit present.  Cardiovascular: RRR, S1 normal, S2 normal, no MRG, pulses symmetric and intact bilaterally Pulmonary/Chest: CTAB, no wheezes, rales, or rhonchi Abdominal: Soft. Non-tender, non-distended, bowel sounds are normal, no masses,  organomegaly, or guarding present.  GU: no CVA tenderness Musculoskeletal: Nochanges. Uses schooter for an ambulation. Hematology: no cervical, inginal, or axillary adenopathy.  Neurological: A&O x3, Strenght is normal and symmetric bilaterally, cranial nerve II-XII are grossly intact, no focal motor deficit, sensory intact to light touch bilaterally.  Skin: Warm, dry and intact. No rash, cyanosis, or clubbing.  Psychiatric: Normal mood and affect. speech and behavior is normal. Judgment and thought content normal. Cognition and memory are normal.   Assessment & Plan:  1. Severe bilateral knee DJD. -D/C vicodin due to allergy - Tylenol # 3 i po q 6 hrs PRN sig# 60; no RF. Contract signed. -paperwork for knee braces filled out.  2. Chronic renal disease. -patient is to follow up with Dr. Caryn Section next month (sees him q 6 months). -unfortunately, will have to d/c verapamil today due to hypotension, orthostasis and bradycardia.  3. Hx of CAD. -will decrease Metoprolol down to 25 mg PO bid due to bradycardia and hypotension. -recheck BP in 2-4 weeks -copy of lipid panel, Cmet and CBC will be sent to dr. Annitta Jersey office (Cards).  4. Major depression. -Stable. Sees Dr. Tresa Endo at [facility] -will check CBC, c-met today. -continue with current regimen.

## 2011-05-02 LAB — LIPID PANEL
Cholesterol: 161 mg/dL (ref 0–200)
Total CHOL/HDL Ratio: 3.5 Ratio
VLDL: 17 mg/dL (ref 0–40)

## 2011-05-02 LAB — CBC WITH DIFFERENTIAL/PLATELET
Eosinophils Absolute: 0.1 10*3/uL (ref 0.0–0.7)
Lymphs Abs: 1.9 10*3/uL (ref 0.7–4.0)
MCH: 30.1 pg (ref 26.0–34.0)
Neutrophils Relative %: 57 % (ref 43–77)
Platelets: 236 10*3/uL (ref 150–400)
RBC: 4.22 MIL/uL (ref 3.87–5.11)
WBC: 5.7 10*3/uL (ref 4.0–10.5)

## 2011-05-02 LAB — COMPLETE METABOLIC PANEL WITH GFR
ALT: <8 U/L (ref 0–35)
AST: 14 U/L (ref 0–37)
Albumin: 3.9 g/dL (ref 3.5–5.2)
Alkaline Phosphatase: 107 U/L (ref 39–117)
Potassium: 4.1 mEq/L (ref 3.5–5.3)
Sodium: 135 mEq/L (ref 135–145)
Total Protein: 7.4 g/dL (ref 6.0–8.3)

## 2011-06-01 DIAGNOSIS — M159 Polyosteoarthritis, unspecified: Secondary | ICD-10-CM | POA: Diagnosis not present

## 2011-06-01 DIAGNOSIS — F329 Major depressive disorder, single episode, unspecified: Secondary | ICD-10-CM | POA: Diagnosis not present

## 2011-06-01 DIAGNOSIS — M1990 Unspecified osteoarthritis, unspecified site: Secondary | ICD-10-CM | POA: Diagnosis not present

## 2011-06-01 DIAGNOSIS — N183 Chronic kidney disease, stage 3 unspecified: Secondary | ICD-10-CM | POA: Diagnosis not present

## 2011-06-04 ENCOUNTER — Ambulatory Visit (INDEPENDENT_AMBULATORY_CARE_PROVIDER_SITE_OTHER): Payer: Medicare Other | Admitting: Internal Medicine

## 2011-06-04 ENCOUNTER — Encounter: Payer: Self-pay | Admitting: Licensed Clinical Social Worker

## 2011-06-04 ENCOUNTER — Encounter: Payer: Self-pay | Admitting: Internal Medicine

## 2011-06-04 VITALS — BP 115/76 | HR 59 | Temp 97.4°F | Ht 62.0 in | Wt 252.6 lb

## 2011-06-04 DIAGNOSIS — F329 Major depressive disorder, single episode, unspecified: Secondary | ICD-10-CM | POA: Diagnosis not present

## 2011-06-04 DIAGNOSIS — M159 Polyosteoarthritis, unspecified: Secondary | ICD-10-CM

## 2011-06-04 DIAGNOSIS — M1990 Unspecified osteoarthritis, unspecified site: Secondary | ICD-10-CM | POA: Diagnosis not present

## 2011-06-04 LAB — BASIC METABOLIC PANEL WITH GFR
BUN: 41 mg/dL — ABNORMAL HIGH (ref 6–23)
CO2: 24 mEq/L (ref 19–32)
Calcium: 8.7 mg/dL (ref 8.4–10.5)
GFR, Est African American: 24 mL/min — ABNORMAL LOW
GFR, Est Non African American: 21 mL/min — ABNORMAL LOW
Sodium: 136 mEq/L (ref 135–145)

## 2011-06-04 MED ORDER — ACETAMINOPHEN-CODEINE 300-30 MG PO TABS: 1.0000 | ORAL_TABLET | Freq: Four times a day (QID) | ORAL | Status: DC | PRN

## 2011-06-04 MED ORDER — BUPROPION HCL ER (XL) 150 MG PO TB24: 150.0000 mg | ORAL_TABLET | ORAL | Status: DC

## 2011-06-04 NOTE — Patient Instructions (Signed)
Stop taking Amlodipine (Norvasc). Stop taking carvedilol (coreg). Please, follow up with Dr. Caryn Section (Kidney specialist) as instructed. Call with any questions and follow up in 3 weeks.

## 2011-06-04 NOTE — Progress Notes (Signed)
Vanessa Palmer is a 61 y.o. female who presents during a scheduled medical appt.  Pt in motorized scooter.  [name] requesting on-going personal care and assistance. [name] is currently not receiving and in-home services. Pt states she is having increased difficulty with cleaning, medication reminders and ADL.  Pt provided CSW with agency, [facility] [phone].  CSW informed pt of process, New Referral form will be completed and faxed to [facility] dept of HHS.

## 2011-06-04 NOTE — Progress Notes (Signed)
Patient ID: Vanessa Palmer, female   DOB: 22-Jun-1950, 61 y.o.   MRN: [id number] HPI:    1. Dizziness, on and off; no black outs, no heart palpitations, HA, SOB, swelling, focal weakness or any other Sx. 2. Depression. Stable. Denies SI./HI or mania. Takes her meds as prescribed. No side-effects. 3. OA -refill on Tylenol #3.  Review of Systems: Negative except per history of present illness  Physical Exam:  Nursing notes and vitals reviewed General:  alert, well-developed, and cooperative to examination.   Lungs:  normal respiratory effort, no accessory muscle use, normal breath sounds, no crackles, and no wheezes. Heart:  normal rate, regular rhythm, no murmurs, no gallop, and no rub.   Abdomen:  soft, non-tender, normal bowel sounds, no distention, no guarding, no rebound tenderness, no hepatomegaly, and no splenomegaly.   Extremities:  No cyanosis, clubbing, edema Neurologic:  alert & oriented X3, nonfocal exam  Meds: Medications Prior to Admission  Medication Sig Dispense Refill  . Acetaminophen-Codeine (TYLENOL/CODEINE #3) 300-30 MG per tablet Take 1 tablet by mouth every 6 (six) hours as needed for pain.  60 tablet  0  . allopurinol (ZYLOPRIM) 100 MG tablet Take 1 tablet (100 mg total) by mouth daily.  30 tablet  11  . aspirin 81 MG tablet Take 81 mg by mouth daily.        Marland Kitchen buPROPion (WELLBUTRIN XL) 150 MG 24 hr tablet Take 1 tablet (150 mg total) by mouth every morning.  30 tablet  2  . citalopram (CELEXA) 40 MG tablet Take 1 tablet (40 mg total) by mouth daily.  30 tablet  3  . Elastic Bandages & Supports (TRUFORM STOCKINGS 30-40MMHG) MISC Apply to both legs while standing or walking  2 each  1  . fesoterodine (TOVIAZ) 4 MG TB24 Take 4 mg by mouth daily.        . fluticasone (FLOVENT HFA) 44 MCG/ACT inhaler Inhale 1 puff into the lungs 2 (two) times daily.  1 Inhaler  6  . lamoTRIgine (LAMICTAL) 100 MG tablet Take 1 tablet (100 mg total) by mouth daily.  30 tablet  3  . losartan  (COZAAR) 100 MG tablet Take 1 tablet (100 mg total) by mouth daily.  30 tablet  11  . metoprolol tartrate (LOPRESSOR) 25 MG tablet Take 1 tablet (25 mg total) by mouth 2 (two) times daily.  60 tablet  11  . nitroGLYCERIN (NITROSTAT) 0.3 MG SL tablet Place 0.3 mg under the tongue every 5 (five) minutes as needed.        Marland Kitchen omeprazole (PRILOSEC) 40 MG capsule Take one tablet by mouth q 12 hours  60 capsule  11   No current facility-administered medications on file as of 06/04/2011.    Allergies: Percocet; Ace inhibitors; Hydromorphone hcl; Propoxacet-n; and Vicodin Past Medical History  Diagnosis Date  . Hyperlipidemia   . Hypertension   . Gout   . Obesity   . Depression   . DJD (degenerative joint disease)   . OSA (obstructive sleep apnea)   . CAD (coronary artery disease)    No past surgical history on file. Family History  Problem Relation Age of Onset  . Mental illness Mother   . Heart disease Father   . Diabetes Sister    History   Social History  . Marital Status: Single    Spouse Name: N/A    Number of Children: N/A  . Years of Education: N/A   Occupational History  . Not  on file.   Social History Main Topics  . Smoking status: Never Smoker   . Smokeless tobacco: Not on file  . Alcohol Use: No  . Drug Use: No  . Sexually Active: Not on file   Other Topics Concern  . Not on file   Social History Narrative  . No narrative on file    A/P: 1. Dizziness likely due to orthostasis secondary to taking Carvedilol, metoprolol and amlodipine and lasix. -D/C lasix, amlodipine and Carvedilol -continue with Losartan and metoprolol -B-met today -fall precuations  2. CKD, stage III -b-met today -f/u with Dr. Caryn Section (Nephro) in May as was instructed  3. Chronic pain syndrome -refill Tylenol #3 -Paperwork for TENS unit filled out -reeval needed after 06/21/11.  4. Depression, major -Stable -continue with current med regimen per Dr. Tresa Endo (Psychiatrist). -  instructed to call 911 and/or go to Ed if SI/HI or mania.

## 2011-06-25 ENCOUNTER — Encounter: Payer: Self-pay | Admitting: Internal Medicine

## 2011-06-25 ENCOUNTER — Ambulatory Visit (INDEPENDENT_AMBULATORY_CARE_PROVIDER_SITE_OTHER): Payer: Medicare Other | Admitting: Internal Medicine

## 2011-06-25 VITALS — BP 158/98 | HR 87 | Temp 97.1°F | Wt 254.3 lb

## 2011-06-25 DIAGNOSIS — I10 Essential (primary) hypertension: Secondary | ICD-10-CM | POA: Diagnosis not present

## 2011-06-25 DIAGNOSIS — N184 Chronic kidney disease, stage 4 (severe): Secondary | ICD-10-CM | POA: Diagnosis not present

## 2011-06-25 DIAGNOSIS — W19XXXA Unspecified fall, initial encounter: Secondary | ICD-10-CM

## 2011-06-25 DIAGNOSIS — I2510 Atherosclerotic heart disease of native coronary artery without angina pectoris: Secondary | ICD-10-CM | POA: Diagnosis not present

## 2011-06-25 MED ORDER — METOPROLOL TARTRATE 50 MG PO TABS: 50.0000 mg | ORAL_TABLET | Freq: Two times a day (BID) | ORAL | Status: DC

## 2011-06-25 NOTE — Patient Instructions (Signed)
Please, follow up with Dr. Caryn Section in regards to your kidney health ->your appointment has been rescheduled. Our office will fax  A copy of your last lab results to Dr. Scherrie Gerlach office. Please, note a an increase in the dose of Metoprolol (Lopressor): 50 mg by mouth twice a day. Please, take all your medications a prescribed and call with any questions. You will be contacted by a nurse to assess for a risk of falls at your home. Please, follow up with [name] in 1 month or sooner.

## 2011-06-25 NOTE — Progress Notes (Signed)
Patient ID: Vanessa Palmer, female   DOB: February 09, 1951, 61 y.o.   MRN: [id number] HPI:    1. Patient reports one episode of a mechanical fall at home when her right knee "just gave way." Patient denies any trauma such as striking her head; denies any difficulty with ambulation which is at baseline (uses walker). Denies any weakness, paresthesias, swelling, fever, SOB, CP or locking of her knees. Patient takes Tylenol #3 with adequate pain relief. Review of Systems: Negative except per history of present illness  Physical Exam:  Nursing notes and vitals reviewed General:  alert, well-developed, and cooperative to examination.   Lungs:  normal respiratory effort, no accessory muscle use, normal breath sounds, no crackles, and no wheezes. Heart:  normal rate, regular rhythm, no murmurs, no gallop, and no rub.   Abdomen:  soft, non-tender, normal bowel sounds, no distention, no guarding, no rebound tenderness, no hepatomegaly, and no splenomegaly.   Extremities:  No cyanosis, clubbing, edema Neurologic:  alert & oriented X3, nonfocal exam  Meds:  (Not in a hospital admission)  Allergies: Percocet; Ace inhibitors; Hydromorphone hcl; Propoxacet-n; and Vicodin Past Medical History  Diagnosis Date  . Hyperlipidemia   . Hypertension   . Gout   . Obesity   . Depression   . DJD (degenerative joint disease)   . OSA (obstructive sleep apnea)   . CAD (coronary artery disease)    No past surgical history on file. Family History  Problem Relation Age of Onset  . Mental illness Mother   . Heart disease Father   . Diabetes Sister    History   Social History  . Marital Status: Single    Spouse Name: N/A    Number of Children: N/A  . Years of Education: N/A   Occupational History  . Not on file.   Social History Main Topics  . Smoking status: Never Smoker   . Smokeless tobacco: Not on file  . Alcohol Use: No  . Drug Use: No  . Sexually Active: Not on file   Other Topics Concern    . Not on file   Social History Narrative  . No narrative on file    A/P: 1. HTN in a setting of CAD -poorly controlled despite the patient's reported adherence with her anti-HTN regimen. -concerning the the setting of a worsening renal function with a CKD stage IV. -previously, the patient was taken off non-dihydropyridine due to orthostatic hypotension  -increase metoprolol 50 mg PO bid HR is >80 bpm) -continue with ARB -will attempt to reschedule the patient's appointment with D Fox (nephrologist) to a sooner date and will fax last B-met results.  2. Status post fall at home on 06/24/2011 -denies any trauma -Hx of severe bilateral knee OA requiring knee arthroplasty. However, the patient declined surgery at this point.  -will order OT and PT eval at home - Weight management!!!!!!  3. Depression -stable -Denies SI/HI or mania -continue with current regimen  -management per Behavioral health -patient is strongly advised to go to ED and/or call 911 if feels SI/HI or maniac. Patient verbalized understanding.

## 2011-06-26 ENCOUNTER — Telehealth: Payer: Self-pay | Admitting: Licensed Clinical Social Worker

## 2011-06-26 NOTE — Telephone Encounter (Signed)
Pt referred for home health services.  CSW spoke with pt via telephone and discussed PCP referral for [facility] PT/[facility].  CSW explained difference between [facility] PT/OT and custodial care services/PCS.  Pt's preferred agency for [facility] services is [facility].  CSW attempted to call agency to determine if the provide skilled services, phone number pt provided was not to an agency, CSW unable to leave message.  Pt in agreement to use [facility] as unable to confirm if [facility] provides skilled services.  Referral to [facility] completed.

## 2011-06-28 DIAGNOSIS — M25569 Pain in unspecified knee: Secondary | ICD-10-CM | POA: Diagnosis not present

## 2011-06-28 DIAGNOSIS — R269 Unspecified abnormalities of gait and mobility: Secondary | ICD-10-CM | POA: Diagnosis not present

## 2011-06-28 DIAGNOSIS — I10 Essential (primary) hypertension: Secondary | ICD-10-CM | POA: Diagnosis not present

## 2011-06-28 DIAGNOSIS — Z9181 History of falling: Secondary | ICD-10-CM | POA: Diagnosis not present

## 2011-06-28 DIAGNOSIS — G4733 Obstructive sleep apnea (adult) (pediatric): Secondary | ICD-10-CM | POA: Diagnosis not present

## 2011-06-28 DIAGNOSIS — F329 Major depressive disorder, single episode, unspecified: Secondary | ICD-10-CM | POA: Diagnosis not present

## 2011-07-02 DIAGNOSIS — G4733 Obstructive sleep apnea (adult) (pediatric): Secondary | ICD-10-CM | POA: Diagnosis not present

## 2011-07-02 DIAGNOSIS — M25569 Pain in unspecified knee: Secondary | ICD-10-CM | POA: Diagnosis not present

## 2011-07-02 DIAGNOSIS — Z9181 History of falling: Secondary | ICD-10-CM | POA: Diagnosis not present

## 2011-07-02 DIAGNOSIS — I10 Essential (primary) hypertension: Secondary | ICD-10-CM | POA: Diagnosis not present

## 2011-07-02 DIAGNOSIS — F329 Major depressive disorder, single episode, unspecified: Secondary | ICD-10-CM | POA: Diagnosis not present

## 2011-07-02 DIAGNOSIS — R269 Unspecified abnormalities of gait and mobility: Secondary | ICD-10-CM | POA: Diagnosis not present

## 2011-07-03 DIAGNOSIS — N184 Chronic kidney disease, stage 4 (severe): Secondary | ICD-10-CM | POA: Diagnosis not present

## 2011-07-03 DIAGNOSIS — Z9181 History of falling: Secondary | ICD-10-CM | POA: Diagnosis not present

## 2011-07-03 DIAGNOSIS — M25569 Pain in unspecified knee: Secondary | ICD-10-CM | POA: Diagnosis not present

## 2011-07-03 DIAGNOSIS — I10 Essential (primary) hypertension: Secondary | ICD-10-CM | POA: Diagnosis not present

## 2011-07-03 DIAGNOSIS — R269 Unspecified abnormalities of gait and mobility: Secondary | ICD-10-CM | POA: Diagnosis not present

## 2011-07-03 DIAGNOSIS — I129 Hypertensive chronic kidney disease with stage 1 through stage 4 chronic kidney disease, or unspecified chronic kidney disease: Secondary | ICD-10-CM | POA: Diagnosis not present

## 2011-07-03 DIAGNOSIS — G4733 Obstructive sleep apnea (adult) (pediatric): Secondary | ICD-10-CM | POA: Diagnosis not present

## 2011-07-03 DIAGNOSIS — F329 Major depressive disorder, single episode, unspecified: Secondary | ICD-10-CM | POA: Diagnosis not present

## 2011-07-04 ENCOUNTER — Telehealth: Payer: Self-pay | Admitting: *Deleted

## 2011-07-04 NOTE — Telephone Encounter (Signed)
Return call from nurse at Dr Scherrie Gerlach office.  She said pt is correct and Dr Caryn Section wanted to increase cozaar to 100 mg bid. She states Dr Caryn Section usually gives a Rx to pt when he changes meds.  I asked her to please have him write the Rx. For pt.    I called the pt and they did call in the change to her pharmacy.

## 2011-07-04 NOTE — Telephone Encounter (Signed)
Pt called and states Dr Caryn Section told her to increase her cozaar 100 mg to twice a day.  I called Dr Marolyn Haller office to verify this and see if this was the correct med.  Will wait for return call,.

## 2011-07-06 DIAGNOSIS — G4733 Obstructive sleep apnea (adult) (pediatric): Secondary | ICD-10-CM | POA: Diagnosis not present

## 2011-07-06 DIAGNOSIS — R269 Unspecified abnormalities of gait and mobility: Secondary | ICD-10-CM | POA: Diagnosis not present

## 2011-07-06 DIAGNOSIS — F329 Major depressive disorder, single episode, unspecified: Secondary | ICD-10-CM | POA: Diagnosis not present

## 2011-07-06 DIAGNOSIS — M25569 Pain in unspecified knee: Secondary | ICD-10-CM | POA: Diagnosis not present

## 2011-07-06 DIAGNOSIS — I10 Essential (primary) hypertension: Secondary | ICD-10-CM | POA: Diagnosis not present

## 2011-07-06 DIAGNOSIS — Z9181 History of falling: Secondary | ICD-10-CM | POA: Diagnosis not present

## 2011-07-09 DIAGNOSIS — G4733 Obstructive sleep apnea (adult) (pediatric): Secondary | ICD-10-CM | POA: Diagnosis not present

## 2011-07-09 DIAGNOSIS — I10 Essential (primary) hypertension: Secondary | ICD-10-CM | POA: Diagnosis not present

## 2011-07-09 DIAGNOSIS — M25569 Pain in unspecified knee: Secondary | ICD-10-CM | POA: Diagnosis not present

## 2011-07-09 DIAGNOSIS — Z9181 History of falling: Secondary | ICD-10-CM | POA: Diagnosis not present

## 2011-07-09 DIAGNOSIS — F329 Major depressive disorder, single episode, unspecified: Secondary | ICD-10-CM | POA: Diagnosis not present

## 2011-07-09 DIAGNOSIS — R269 Unspecified abnormalities of gait and mobility: Secondary | ICD-10-CM | POA: Diagnosis not present

## 2011-07-10 ENCOUNTER — Encounter: Payer: Self-pay | Admitting: Internal Medicine

## 2011-07-10 ENCOUNTER — Ambulatory Visit (INDEPENDENT_AMBULATORY_CARE_PROVIDER_SITE_OTHER): Payer: Medicare Other | Admitting: Internal Medicine

## 2011-07-10 ENCOUNTER — Ambulatory Visit (HOSPITAL_COMMUNITY)
Admission: RE | Admit: 2011-07-10 | Discharge: 2011-07-10 | Disposition: A | Discharge status: Home / Self Care | Payer: Medicare Other | Source: Ambulatory Visit | Attending: Internal Medicine | Admitting: Internal Medicine

## 2011-07-10 VITALS — BP 150/82 | HR 57 | Temp 96.6°F | Ht 62.0 in | Wt 253.2 lb

## 2011-07-10 DIAGNOSIS — R42 Dizziness and giddiness: Secondary | ICD-10-CM | POA: Insufficient documentation

## 2011-07-10 DIAGNOSIS — R269 Unspecified abnormalities of gait and mobility: Secondary | ICD-10-CM | POA: Diagnosis not present

## 2011-07-10 DIAGNOSIS — R1084 Generalized abdominal pain: Secondary | ICD-10-CM | POA: Diagnosis not present

## 2011-07-10 DIAGNOSIS — I10 Essential (primary) hypertension: Secondary | ICD-10-CM | POA: Diagnosis not present

## 2011-07-10 DIAGNOSIS — I517 Cardiomegaly: Secondary | ICD-10-CM | POA: Insufficient documentation

## 2011-07-10 DIAGNOSIS — G4733 Obstructive sleep apnea (adult) (pediatric): Secondary | ICD-10-CM | POA: Diagnosis not present

## 2011-07-10 DIAGNOSIS — M25569 Pain in unspecified knee: Secondary | ICD-10-CM | POA: Diagnosis not present

## 2011-07-10 DIAGNOSIS — R1013 Epigastric pain: Secondary | ICD-10-CM | POA: Diagnosis not present

## 2011-07-10 DIAGNOSIS — G8929 Other chronic pain: Secondary | ICD-10-CM

## 2011-07-10 DIAGNOSIS — F329 Major depressive disorder, single episode, unspecified: Secondary | ICD-10-CM | POA: Diagnosis not present

## 2011-07-10 DIAGNOSIS — Z9181 History of falling: Secondary | ICD-10-CM | POA: Diagnosis not present

## 2011-07-10 LAB — POCT URINALYSIS DIPSTICK
Ketones, UA: NEGATIVE
Leukocytes, UA: NEGATIVE
Nitrite, UA: NEGATIVE
Urobilinogen, UA: 4

## 2011-07-10 LAB — COMPLETE METABOLIC PANEL WITH GFR
ALT: 9 U/L (ref 0–35)
AST: 14 U/L (ref 0–37)
Alkaline Phosphatase: 124 U/L — ABNORMAL HIGH (ref 39–117)
Sodium: 135 mEq/L (ref 135–145)
Total Bilirubin: 0.6 mg/dL (ref 0.3–1.2)
Total Protein: 8.1 g/dL (ref 6.0–8.3)

## 2011-07-10 LAB — CBC
HCT: 37.6 % (ref 36.0–46.0)
Hemoglobin: 12.9 g/dL (ref 12.0–15.0)
MCH: 30.8 pg (ref 26.0–34.0)
MCHC: 34.3 g/dL (ref 30.0–36.0)
RBC: 4.19 MIL/uL (ref 3.87–5.11)

## 2011-07-10 MED ORDER — METRONIDAZOLE 500 MG PO TABS: 500.0000 mg | ORAL_TABLET | Freq: Three times a day (TID) | ORAL | Status: DC

## 2011-07-10 MED ORDER — POLYETHYLENE GLYCOL 3350 17 G PO PACK: 17.0000 g | PACK | Freq: Every day | ORAL | Status: AC

## 2011-07-10 MED ORDER — CIPROFLOXACIN HCL 500 MG PO TABS: 500.0000 mg | ORAL_TABLET | Freq: Two times a day (BID) | ORAL | Status: AC

## 2011-07-10 NOTE — Patient Instructions (Addendum)
Please, take all your medications as prescribed. Please, follow up with Dr. Caryn Section as instructed. Please, follow up on Friday as instructed. Thank you.

## 2011-07-10 NOTE — Progress Notes (Signed)
Patient ID: Vanessa Palmer, female   DOB: Dec 17, 1950, 61 y.o.   MRN: [id number] HPI:    61 y/o AA woman undergoes evaluation for dizziness and abdominal pain of 8 hours duration. Reports being awaken with a generalized cramping pain 10/10 in intensity without any radiation, N/V or Diarrhea. Last BM 07/09/2011 regular in color and consistency; no BRBPR and melena. Denies any relation to food intake and reports good appetite. Denies any fever, chills, rash, dysuria, hematuria, vaginal discharge or any other Sx. Denies having any similar Sx in the past. Denies any ETOH use. Review of Systems: Negative except per history of present illness  Physical Exam:  Nursing notes and vitals reviewed; no orthostasis. General:  alert, in mild distress due to abdominal pain, crying, and cooperative to examination.   Lungs:  normal respiratory effort, no accessory muscle use, normal breath sounds, no crackles, and no wheezes. Heart:  normal rate, regular rhythm, no murmurs. Abdomen:  obese, non distended, BS+ hyperactive; generalized TTP and percusions, no guarding, no rebound tenderness, unable to palpate liver or spleen due to pannus.   Extremities:  No cyanosis, clubbing, edema Neurologic:  alert & oriented X3, nonfocal exam Psych: flat affect, tearful, no eye contact  Meds: Current Outpatient Prescriptions on File Prior to Visit  Medication Sig Dispense Refill  . Acetaminophen-Codeine (TYLENOL/CODEINE #3) 300-30 MG per tablet Take 1 tablet by mouth every 6 (six) hours as needed for pain.  60 tablet  0  . allopurinol (ZYLOPRIM) 100 MG tablet Take 1 tablet (100 mg total) by mouth daily.  30 tablet  11  . aspirin 81 MG tablet Take 81 mg by mouth daily.        Marland Kitchen buPROPion (WELLBUTRIN XL) 150 MG 24 hr tablet Take 1 tablet (150 mg total) by mouth every morning.  30 tablet  2  . citalopram (CELEXA) 40 MG tablet Take 1 tablet (40 mg total) by mouth daily.  30 tablet  3  . Elastic Bandages & Supports (TRUFORM  STOCKINGS 30-40MMHG) MISC Apply to both legs while standing or walking  2 each  1  . fesoterodine (TOVIAZ) 4 MG TB24 Take 4 mg by mouth daily.        . fluticasone (FLOVENT HFA) 44 MCG/ACT inhaler Inhale 1 puff into the lungs 2 (two) times daily.  1 Inhaler  6  . lamoTRIgine (LAMICTAL) 100 MG tablet Take 1 tablet (100 mg total) by mouth daily.  30 tablet  3  . losartan (COZAAR) 100 MG tablet Take 1 tablet (100 mg total) by mouth daily.  30 tablet  11  . metoprolol (LOPRESSOR) 50 MG tablet Take 1 tablet (50 mg total) by mouth 2 (two) times daily.  60 tablet  11  . nitroGLYCERIN (NITROSTAT) 0.3 MG SL tablet Place 0.3 mg under the tongue every 5 (five) minutes as needed.        Marland Kitchen omeprazole (PRILOSEC) 40 MG capsule Take one tablet by mouth q 12 hours  60 capsule  11    Allergies: Percocet; Ace inhibitors; Hydromorphone hcl; Propoxyphene-acetaminophen; and Vicodin Past Medical History  Diagnosis Date  . Hyperlipidemia   . Hypertension   . Gout   . Obesity   . Depression   . DJD (degenerative joint disease)   . OSA (obstructive sleep apnea)   . CAD (coronary artery disease)    No past surgical history on file. Family History  Problem Relation Age of Onset  . Mental illness Mother   . Heart disease  Father   . Diabetes Sister    History   Social History  . Marital Status: Single    Spouse Name: N/A    Number of Children: N/A  . Years of Education: N/A   Occupational History  . Not on file.   Social History Main Topics  . Smoking status: Never Smoker   . Smokeless tobacco: Not on file  . Alcohol Use: No  . Drug Use: No  . Sexually Active: Not on file   Other Topics Concern  . Not on file   Social History Narrative  . No narrative on file    A/P: 1. Generalized abdominal pain. Unclear etiology. DDx include diverticuloses, pancreatitis (Hx of ETOH abuse), PUD, gastritis, adhesions, UTI -Hemocult neg x1 -UA neg for blood, nitrites and leuk esterase - CBC, C-met, lipase  pending - Abdomen Xray acute series to eval for perforation neg but reveals stool. - bowel regimen with miralax -Cipro, flagyl -handout on diverticuloses -f/u in 2 days 1. HTN.  -Cozaar has been increased to 100 mg PO bid by Dr. Caryn Section (nephrologist) -no orthostasis -low salt diet -weight management  3. CKD stage IV -patient is to see Dr. Caryn Section in mid-June  3. Depression -stable -denies SI/HI or mania -continue with current regimen and follow up at [facility]

## 2011-07-11 DIAGNOSIS — G4733 Obstructive sleep apnea (adult) (pediatric): Secondary | ICD-10-CM | POA: Diagnosis not present

## 2011-07-11 DIAGNOSIS — I10 Essential (primary) hypertension: Secondary | ICD-10-CM | POA: Diagnosis not present

## 2011-07-11 DIAGNOSIS — R269 Unspecified abnormalities of gait and mobility: Secondary | ICD-10-CM | POA: Diagnosis not present

## 2011-07-11 DIAGNOSIS — M25569 Pain in unspecified knee: Secondary | ICD-10-CM | POA: Diagnosis not present

## 2011-07-11 DIAGNOSIS — F329 Major depressive disorder, single episode, unspecified: Secondary | ICD-10-CM | POA: Diagnosis not present

## 2011-07-11 DIAGNOSIS — Z9181 History of falling: Secondary | ICD-10-CM | POA: Diagnosis not present

## 2011-07-12 DIAGNOSIS — G4733 Obstructive sleep apnea (adult) (pediatric): Secondary | ICD-10-CM | POA: Diagnosis not present

## 2011-07-12 DIAGNOSIS — Z9181 History of falling: Secondary | ICD-10-CM | POA: Diagnosis not present

## 2011-07-12 DIAGNOSIS — R269 Unspecified abnormalities of gait and mobility: Secondary | ICD-10-CM | POA: Diagnosis not present

## 2011-07-12 DIAGNOSIS — M25569 Pain in unspecified knee: Secondary | ICD-10-CM | POA: Diagnosis not present

## 2011-07-12 DIAGNOSIS — I10 Essential (primary) hypertension: Secondary | ICD-10-CM | POA: Diagnosis not present

## 2011-07-12 DIAGNOSIS — F329 Major depressive disorder, single episode, unspecified: Secondary | ICD-10-CM | POA: Diagnosis not present

## 2011-07-13 ENCOUNTER — Ambulatory Visit (INDEPENDENT_AMBULATORY_CARE_PROVIDER_SITE_OTHER): Payer: Medicare Other | Admitting: Ophthalmology

## 2011-07-13 ENCOUNTER — Encounter: Payer: Self-pay | Admitting: Ophthalmology

## 2011-07-13 ENCOUNTER — Observation Stay (HOSPITAL_COMMUNITY)
Admission: AD | Admit: 2011-07-13 | Discharge: 2011-07-15 | Disposition: A | Discharge status: Home / Self Care | Payer: Medicare Other | Source: Ambulatory Visit | Attending: Internal Medicine | Admitting: Internal Medicine

## 2011-07-13 ENCOUNTER — Other Ambulatory Visit (HOSPITAL_COMMUNITY)
Admission: RE | Admit: 2011-07-13 | Discharge: 2011-07-13 | Disposition: A | Discharge status: Home / Self Care | Payer: Medicare Other | Source: Ambulatory Visit | Attending: Internal Medicine | Admitting: Internal Medicine

## 2011-07-13 ENCOUNTER — Encounter (HOSPITAL_COMMUNITY): Payer: Self-pay | Admitting: Internal Medicine

## 2011-07-13 VITALS — BP 150/70 | HR 82 | Temp 97.1°F | Ht 62.0 in | Wt 254.2 lb

## 2011-07-13 DIAGNOSIS — R109 Unspecified abdominal pain: Principal | ICD-10-CM | POA: Insufficient documentation

## 2011-07-13 DIAGNOSIS — E785 Hyperlipidemia, unspecified: Secondary | ICD-10-CM | POA: Diagnosis present

## 2011-07-13 DIAGNOSIS — R1084 Generalized abdominal pain: Secondary | ICD-10-CM

## 2011-07-13 DIAGNOSIS — I10 Essential (primary) hypertension: Secondary | ICD-10-CM

## 2011-07-13 DIAGNOSIS — R1031 Right lower quadrant pain: Secondary | ICD-10-CM | POA: Diagnosis present

## 2011-07-13 DIAGNOSIS — R0789 Other chest pain: Secondary | ICD-10-CM | POA: Insufficient documentation

## 2011-07-13 DIAGNOSIS — R110 Nausea: Secondary | ICD-10-CM | POA: Diagnosis not present

## 2011-07-13 DIAGNOSIS — J45909 Unspecified asthma, uncomplicated: Secondary | ICD-10-CM | POA: Insufficient documentation

## 2011-07-13 DIAGNOSIS — N760 Acute vaginitis: Secondary | ICD-10-CM | POA: Insufficient documentation

## 2011-07-13 DIAGNOSIS — N183 Chronic kidney disease, stage 3 unspecified: Secondary | ICD-10-CM | POA: Insufficient documentation

## 2011-07-13 DIAGNOSIS — I129 Hypertensive chronic kidney disease with stage 1 through stage 4 chronic kidney disease, or unspecified chronic kidney disease: Secondary | ICD-10-CM | POA: Insufficient documentation

## 2011-07-13 DIAGNOSIS — F329 Major depressive disorder, single episode, unspecified: Secondary | ICD-10-CM | POA: Insufficient documentation

## 2011-07-13 DIAGNOSIS — F3289 Other specified depressive episodes: Secondary | ICD-10-CM | POA: Insufficient documentation

## 2011-07-13 DIAGNOSIS — I2510 Atherosclerotic heart disease of native coronary artery without angina pectoris: Secondary | ICD-10-CM

## 2011-07-13 DIAGNOSIS — N730 Acute parametritis and pelvic cellulitis: Secondary | ICD-10-CM

## 2011-07-13 DIAGNOSIS — K219 Gastro-esophageal reflux disease without esophagitis: Secondary | ICD-10-CM

## 2011-07-13 DIAGNOSIS — M549 Dorsalgia, unspecified: Secondary | ICD-10-CM | POA: Diagnosis present

## 2011-07-13 HISTORY — DX: Chronic kidney disease, stage 3 (moderate): N18.3

## 2011-07-13 HISTORY — DX: Unspecified asthma with (acute) exacerbation: J45.901

## 2011-07-13 HISTORY — DX: Alcohol abuse, uncomplicated: F10.10

## 2011-07-13 LAB — CBC WITH DIFFERENTIAL/PLATELET
Basophils Relative: 1 % (ref 0–1)
HCT: 39.9 % (ref 36.0–46.0)
Hemoglobin: 13.1 g/dL (ref 12.0–15.0)
Lymphs Abs: 1.4 10*3/uL (ref 0.7–4.0)
MCH: 30 pg (ref 26.0–34.0)
MCHC: 32.8 g/dL (ref 30.0–36.0)
Monocytes Absolute: 0.2 10*3/uL (ref 0.1–1.0)
Monocytes Relative: 6 % (ref 3–12)
Neutro Abs: 2.4 10*3/uL (ref 1.7–7.7)
RBC: 4.37 MIL/uL (ref 3.87–5.11)

## 2011-07-13 LAB — LIPASE: Lipase: 39 U/L (ref 11–59)

## 2011-07-13 LAB — COMPREHENSIVE METABOLIC PANEL
Albumin: 3.6 g/dL (ref 3.5–5.2)
Alkaline Phosphatase: 135 U/L — ABNORMAL HIGH (ref 39–117)
BUN: 26 mg/dL — ABNORMAL HIGH (ref 6–23)
CO2: 27 mEq/L (ref 19–32)
Calcium: 9.3 mg/dL (ref 8.4–10.5)
Glucose, Bld: 99 mg/dL (ref 70–99)
Potassium: 4.8 mEq/L (ref 3.5–5.3)
Sodium: 138 mEq/L (ref 135–145)
Total Protein: 8.1 g/dL (ref 6.0–8.3)

## 2011-07-13 LAB — CARDIAC PANEL(CRET KIN+CKTOT+MB+TROPI)
CK, MB: 1.7 ng/mL (ref 0.3–4.0)
Total CK: 74 U/L (ref 7–177)
Troponin I: <0.3 ng/mL (ref <0.30)

## 2011-07-13 LAB — TROPONIN I: Troponin I: <0.3 ng/mL (ref <0.30)

## 2011-07-13 LAB — LACTIC ACID, PLASMA: Lactic Acid, Venous: 2.3 mmol/L — ABNORMAL HIGH (ref 0.5–2.2)

## 2011-07-13 MED ORDER — ACETAMINOPHEN-CODEINE #3 300-30 MG PO TABS
1.0000 | ORAL_TABLET | ORAL | Status: DC | PRN
  Administered 2011-07-14 (×2): 2 via ORAL
  Filled 2011-07-13 (×2): qty 2

## 2011-07-13 MED ORDER — ASPIRIN 81 MG PO TABS: 81.0000 mg | ORAL_TABLET | Freq: Every day | ORAL | Status: DC

## 2011-07-13 MED ORDER — PANTOPRAZOLE SODIUM 40 MG PO TBEC
40.0000 mg | DELAYED_RELEASE_TABLET | Freq: Every day | ORAL | Status: DC
  Administered 2011-07-14 – 2011-07-15 (×2): 40 mg via ORAL
  Filled 2011-07-13: qty 1

## 2011-07-13 MED ORDER — CEFTRIAXONE SODIUM 250 MG IJ SOLR
250.0000 mg | INTRAMUSCULAR | Status: AC
  Administered 2011-07-13: 250 mg via INTRAMUSCULAR
  Filled 2011-07-13: qty 250

## 2011-07-13 MED ORDER — SODIUM CHLORIDE 0.9 % IV SOLN: 250.0000 mL | INTRAVENOUS | Status: DC | PRN

## 2011-07-13 MED ORDER — NITROGLYCERIN 0.3 MG SL SUBL
0.3000 mg | SUBLINGUAL_TABLET | SUBLINGUAL | Status: DC | PRN
  Filled 2011-07-13: qty 100

## 2011-07-13 MED ORDER — HEPARIN SODIUM (PORCINE) 5000 UNIT/ML IJ SOLN
5000.0000 [IU] | Freq: Three times a day (TID) | INTRAMUSCULAR | Status: DC
  Administered 2011-07-13 – 2011-07-15 (×5): 5000 [IU] via SUBCUTANEOUS
  Filled 2011-07-13 (×8): qty 1

## 2011-07-13 MED ORDER — LAMOTRIGINE 100 MG PO TABS
100.0000 mg | ORAL_TABLET | Freq: Every day | ORAL | Status: DC
  Administered 2011-07-14 – 2011-07-15 (×2): 100 mg via ORAL
  Filled 2011-07-13 (×2): qty 1

## 2011-07-13 MED ORDER — CITALOPRAM HYDROBROMIDE 40 MG PO TABS
40.0000 mg | ORAL_TABLET | Freq: Every day | ORAL | Status: DC
  Administered 2011-07-14 – 2011-07-15 (×2): 40 mg via ORAL
  Filled 2011-07-13 (×2): qty 1

## 2011-07-13 MED ORDER — ONDANSETRON HCL 4 MG/2ML IJ SOLN
4.0000 mg | Freq: Four times a day (QID) | INTRAMUSCULAR | Status: DC | PRN
  Administered 2011-07-14: 4 mg via INTRAVENOUS
  Filled 2011-07-13: qty 2

## 2011-07-13 MED ORDER — SODIUM CHLORIDE 0.9 % IJ SOLN: 3.0000 mL | INTRAMUSCULAR | Status: DC | PRN

## 2011-07-13 MED ORDER — DIPHENHYDRAMINE HCL 25 MG PO CAPS
25.0000 mg | ORAL_CAPSULE | Freq: Four times a day (QID) | ORAL | Status: DC | PRN
  Administered 2011-07-14: 25 mg via ORAL
  Filled 2011-07-13: qty 1

## 2011-07-13 MED ORDER — ACETAMINOPHEN-CODEINE 300-30 MG PO TABS: 1.0000 | ORAL_TABLET | Freq: Once | ORAL | Status: DC

## 2011-07-13 MED ORDER — ACETAMINOPHEN-CODEINE 300-30 MG PO TABS: 1.0000 | ORAL_TABLET | ORAL | Status: DC

## 2011-07-13 MED ORDER — FESOTERODINE FUMARATE ER 4 MG PO TB24
4.0000 mg | ORAL_TABLET | Freq: Every day | ORAL | Status: DC
  Administered 2011-07-14 – 2011-07-15 (×2): 4 mg via ORAL
  Filled 2011-07-13 (×2): qty 1

## 2011-07-13 MED ORDER — ASPIRIN EC 81 MG PO TBEC
81.0000 mg | DELAYED_RELEASE_TABLET | Freq: Every day | ORAL | Status: DC
  Administered 2011-07-14 – 2011-07-15 (×2): 81 mg via ORAL
  Filled 2011-07-13 (×2): qty 1

## 2011-07-13 MED ORDER — MORPHINE SULFATE 2 MG/ML IJ SOLN
2.0000 mg | INTRAMUSCULAR | Status: DC | PRN
Start: 2011-07-13 — End: 2011-07-13

## 2011-07-13 MED ORDER — METOPROLOL TARTRATE 50 MG PO TABS
50.0000 mg | ORAL_TABLET | Freq: Two times a day (BID) | ORAL | Status: DC
  Administered 2011-07-13 – 2011-07-15 (×4): 50 mg via ORAL
  Filled 2011-07-13 (×5): qty 1

## 2011-07-13 MED ORDER — CIPROFLOXACIN HCL 500 MG PO TABS
500.0000 mg | ORAL_TABLET | Freq: Two times a day (BID) | ORAL | Status: DC
  Filled 2011-07-13 (×2): qty 1

## 2011-07-13 MED ORDER — SODIUM CHLORIDE 0.9 % IJ SOLN
3.0000 mL | Freq: Two times a day (BID) | INTRAMUSCULAR | Status: DC
  Administered 2011-07-13 – 2011-07-15 (×4): 3 mL via INTRAVENOUS

## 2011-07-13 MED ORDER — DOXYCYCLINE HYCLATE 100 MG PO TABS
100.0000 mg | ORAL_TABLET | Freq: Two times a day (BID) | ORAL | Status: DC
  Administered 2011-07-13 – 2011-07-15 (×4): 100 mg via ORAL
  Filled 2011-07-13 (×5): qty 1

## 2011-07-13 MED ORDER — NITROGLYCERIN 0.4 MG SL SUBL: 0.4000 mg | SUBLINGUAL_TABLET | SUBLINGUAL | Status: DC | PRN

## 2011-07-13 MED ORDER — IOHEXOL 300 MG/ML  SOLN
20.0000 mL | INTRAMUSCULAR | Status: AC
  Administered 2011-07-13: 20 mL via ORAL

## 2011-07-13 MED ORDER — BUPROPION HCL ER (XL) 150 MG PO TB24
150.0000 mg | ORAL_TABLET | Freq: Every day | ORAL | Status: DC
  Administered 2011-07-14 – 2011-07-15 (×2): 150 mg via ORAL
  Filled 2011-07-13 (×2): qty 1

## 2011-07-13 MED ORDER — LOSARTAN POTASSIUM 50 MG PO TABS
100.0000 mg | ORAL_TABLET | Freq: Two times a day (BID) | ORAL | Status: DC
  Administered 2011-07-13 – 2011-07-15 (×4): 100 mg via ORAL
  Filled 2011-07-13 (×5): qty 2

## 2011-07-13 MED ORDER — ALLOPURINOL 100 MG PO TABS
100.0000 mg | ORAL_TABLET | Freq: Every day | ORAL | Status: DC
  Administered 2011-07-13 – 2011-07-15 (×3): 100 mg via ORAL
  Filled 2011-07-13 (×3): qty 1

## 2011-07-13 MED ORDER — SODIUM CHLORIDE 0.9 % IJ SOLN: 3.0000 mL | Freq: Two times a day (BID) | INTRAMUSCULAR | Status: DC

## 2011-07-13 MED ORDER — METRONIDAZOLE 500 MG PO TABS
500.0000 mg | ORAL_TABLET | Freq: Three times a day (TID) | ORAL | Status: DC
  Administered 2011-07-13 – 2011-07-15 (×5): 500 mg via ORAL
  Filled 2011-07-13 (×7): qty 1

## 2011-07-13 MED ORDER — FLUTICASONE PROPIONATE HFA 44 MCG/ACT IN AERO
1.0000 | INHALATION_SPRAY | Freq: Two times a day (BID) | RESPIRATORY_TRACT | Status: DC
  Administered 2011-07-14 – 2011-07-15 (×4): 1 via RESPIRATORY_TRACT
  Filled 2011-07-13: qty 10.6

## 2011-07-13 NOTE — Progress Notes (Signed)
Addended by: Maura Crandall on: 07/13/2011 04:56 PM   Modules accepted: Orders

## 2011-07-13 NOTE — Assessment & Plan Note (Addendum)
Patient's pain is somewhat improved but persists 4 days after it began. She was assessed for UTI, pancreatitis, liver abnormalities or infection and all of her labs were WNL. Reordered lipase, cmet, UA, and cbc and ordered a troponin as well. Patient's past MIs in 2003, 2004 & 2005 presented with chest pain so I am not as concerned about this, especially since pain is constant and lasting several days. She has been on presumptive treatment for diverticulitis with cipro and flagyl for 3 days. She was also given miralax for some mild constipation. Will admit for observation and plan for CT scan. Her last creatinine was 1.73 so can decide if she is a candidate for IV contrast. She has history of partial hysterectomy

## 2011-07-13 NOTE — Progress Notes (Signed)
Subjective:   Patient ID: Vanessa Palmer female   DOB: 28-Mar-1950 61 y.o.   MRN: [id number]  HPI: [name] is a 61 y.o. year old woman with several past non-Q wave MIs and renal insufficiency who presents for 3 day follow up for abdominal pain. Started on Monday and was seen here in clinic in Tuesday. Pain is slightly better 7-8/10, is constant, and not affected by eating. Pain is diffuse, and is crampy. Patient is able to eat and is having regular BM, last BM this AM. No melena or hematochezia. Nausea but no vomiting, no diarrhea. Has been passing a lot of gas. On exertion pain gets worse and she has sweatiness.   Hasn't been around anyone who is sick. No pets.  Producing good amount of urine.   Mood has been so-so. Little more upset than normal. Needs to call Dr. Tresa Endo to see if she can be seen.  Had 2 heart attacks in the past. She was unaware that she had them.  Past Medical History  Diagnosis Date  . Hyperlipidemia   . Hypertension   . Gout   . Obesity   . Depression   . DJD (degenerative joint disease)   . OSA (obstructive sleep apnea)   . CAD (coronary artery disease)    Current Outpatient Prescriptions  Medication Sig Dispense Refill  . Acetaminophen-Codeine (TYLENOL/CODEINE #3) 300-30 MG per tablet Take 1 tablet by mouth every 6 (six) hours as needed for pain.  60 tablet  0  . allopurinol (ZYLOPRIM) 100 MG tablet Take 1 tablet (100 mg total) by mouth daily.  30 tablet  11  . aspirin 81 MG tablet Take 81 mg by mouth daily.        Marland Kitchen buPROPion (WELLBUTRIN XL) 150 MG 24 hr tablet Take 1 tablet (150 mg total) by mouth every morning.  30 tablet  2  . ciprofloxacin (CIPRO) 500 MG tablet Take 1 tablet (500 mg total) by mouth 2 (two) times daily.  10 tablet  0  . citalopram (CELEXA) 40 MG tablet Take 1 tablet (40 mg total) by mouth daily.  30 tablet  3  . Elastic Bandages & Supports (TRUFORM STOCKINGS 30-40MMHG) MISC Apply to both legs while standing or walking  2 each  1    . fesoterodine (TOVIAZ) 4 MG TB24 Take 4 mg by mouth daily.        . fluticasone (FLOVENT HFA) 44 MCG/ACT inhaler Inhale 1 puff into the lungs 2 (two) times daily.  1 Inhaler  6  . lamoTRIgine (LAMICTAL) 100 MG tablet Take 1 tablet (100 mg total) by mouth daily.  30 tablet  3  . losartan (COZAAR) 100 MG tablet Take 100 mg by mouth 2 (two) times daily.      . metoprolol (LOPRESSOR) 50 MG tablet Take 1 tablet (50 mg total) by mouth 2 (two) times daily.  60 tablet  11  . metroNIDAZOLE (FLAGYL) 500 MG tablet Take 1 tablet (500 mg total) by mouth 3 (three) times daily.  21 tablet  0  . nitroGLYCERIN (NITROSTAT) 0.3 MG SL tablet Place 0.3 mg under the tongue every 5 (five) minutes as needed.        Marland Kitchen omeprazole (PRILOSEC) 40 MG capsule Take one tablet by mouth q 12 hours  60 capsule  11  . polyethylene glycol (MIRALAX / GLYCOLAX) packet Take 17 g by mouth daily.  14 each  0   Family History  Problem Relation Age of Onset  .  Mental illness Mother   . Heart disease Father   . Diabetes Sister    History   Social History  . Marital Status: Single    Spouse Name: N/A    Number of Children: N/A  . Years of Education: N/A   Social History Main Topics  . Smoking status: Never Smoker   . Smokeless tobacco: None  . Alcohol Use: No  . Drug Use: No  . Sexually Active: None   Other Topics Concern  . None   Social History Narrative  . None   Objective:  Physical Exam: Filed Vitals:   07/13/11 1401  BP: 150/70  Pulse: 82  Temp: 97.1 F (36.2 C)  TempSrc: Oral  Height: 5\' 2"  (1.575 m)  Weight: 254 lb 3.2 oz (115.304 kg)   General: depressed appearing woman sitting in chair in no obvious distress HEENT: PERRL, EOMI, no scleral icterus Cardiac: RRR, no rubs, murmurs or gallops Pulm: clear to auscultation bilaterally, moving normal volumes of air Abd: soft, tender to palpation in bilateral lower quadrants. Active BS. Ext: warm and well perfused, no pedal edema Neuro: alert and  oriented X3, cranial nerves II-XII grossly intact  Assessment & Plan:

## 2011-07-13 NOTE — H&P (Signed)
Date: 07/13/2011               Patient Name:  Vanessa Palmer MRN: [id number]  DOB: 09-Apr-1950 Age / Sex: 61 y.o., female   PCP: Deatra Robinson, MD              Medical Service: Internal Medicine Teaching Service              Attending Physician: Dr. Rocco Serene, MD    First Contact: Dr. Candy Sledge Pager: [phone]  Second Contact: Dr. Saralyn Pilar Pager: [phone]            After Hours (After 5p/  First Contact Pager: [phone]  weekends / holidays): Second Contact Pager: [phone]     Chief Complaint: Abdominal pain  History of Present Illness: Patient is a 61 y.o. female with a PMHx of HTN, HLD, Depression, who is admitted to [facility] as a direct admission from [facility] for evaluation of 5 day history of diffuse abdominal. On 05/142013, the patient was treated with cipro and flagyl as an outpatient for presumed diverticulitis. At that time, UA, CMET, lipase were within normal limits. Despite outpatient cipro/ flagyl, the patient's symptoms persisted. Therefore, she is admitted for continued workup. In regards to the patient's abdominal pain, she describes constant, nonradiating pain that is sharp and crampy in nature with associated nausea (but no vomiting). The pain is not affected by food intake - and the patient has been eating and drinking normally. The patient is not constipated, she has been having regular bowel movements, with last movement on the morning of admission. States she had a colonoscopy within the last year that was normal. She has noted increased malodorous vaginal discharge in her diaper. She is not currently sexually active with last encounter 6 years ago. Lastly, she denies any fever, rash, dysuria, hematuria.  Vanessa Palmer very quietly also mentions that she has had some substernal chest pain that started on the evening to admission, and self-resolved within 5 minutes. She states that the chest pain was not severe enough to require her to use her nitroglycerin. It  was not associated with nausea or sweating. The patient has had 3 prior MI's - she had chest pain with 2, the other was silent. She cannot tell if the episode last night is the same as prior anginal episodes.   Review of Systems: Constitutional:  denies fever, chills, diaphoresis, appetite change and fatigue.  HEENT: denies photophobia, eye pain, redness, hearing loss, ear pain, congestion, sore throat, rhinorrhea, sneezing, neck pain, neck stiffness and tinnitus.  Respiratory: denies SOB, DOE, cough, chest tightness, and wheezing.  Cardiovascular: admits to chest pain last night. Denies palpitations and leg swelling.  Gastrointestinal: admits to nausea, vomiting, abdominal pain, diarrhea, constipation, blood in stool.  Genitourinary: denies dysuria, urgency, frequency, hematuria, flank pain and difficulty urinating.  Musculoskeletal: admits to knee pain. denies myalgias, back pain, joint swelling, arthralgias and gait problem.   Skin: denies pallor, rash and wound.  Neurological: denies dizziness, seizures, syncope, weakness, light-headedness, numbness and headaches.   Psychiatric/ Behavioral: admits to depression, Denies SI/HI    Current Outpatient Medications: Medication Sig  . Acetaminophen-Codeine (TYLENOL/CODEINE #3) 300-30 MG per tablet Take 1 tablet by mouth every 6 (six) hours as needed for pain.  Marland Kitchen allopurinol (ZYLOPRIM) 100 MG tablet Take 1 tablet (100 mg total) by mouth daily.  Marland Kitchen aspirin 81 MG tablet Take 81 mg by mouth daily.    Marland Kitchen buPROPion (WELLBUTRIN XL) 150 MG 24  hr tablet Take 1 tablet (150 mg total) by mouth every morning.  . ciprofloxacin (CIPRO) 500 MG tablet Take 1 tablet (500 mg total) by mouth 2 (two) times daily.  . citalopram (CELEXA) 40 MG tablet Take 1 tablet (40 mg total) by mouth daily.  . fesoterodine (TOVIAZ) 4 MG TB24 Take 4 mg by mouth daily.    . fluticasone (FLOVENT HFA) 44 MCG/ACT inhaler Inhale 1 puff into the lungs 2 (two) times daily.  Marland Kitchen lamoTRIgine  (LAMICTAL) 100 MG tablet Take 1 tablet (100 mg total) by mouth daily.  Marland Kitchen losartan (COZAAR) 100 MG tablet Take 100 mg by mouth 2 (two) times daily.  . metoprolol (LOPRESSOR) 50 MG tablet Take 1 tablet (50 mg total) by mouth 2 (two) times daily.  . metroNIDAZOLE (FLAGYL) 500 MG tablet Take 1 tablet (500 mg total) by mouth 3 (three) times daily.  . nitroGLYCERIN (NITROSTAT) 0.3 MG SL tablet Place 0.3 mg under the tongue every 5 (five) minutes as needed.    Marland Kitchen omeprazole (PRILOSEC) 40 MG capsule Take one tablet by mouth q 12 hours  . polyethylene glycol (MIRALAX / GLYCOLAX) packet Take 17 g by mouth daily.  . Elastic Bandages & Supports (TRUFORM STOCKINGS 30-40MMHG) MISC Apply to both legs while standing or walking     Allergies: Allergen Reactions  . Percocet (Oxycodone-Acetaminophen) Itching  . Ace Inhibitors     REACTION: Angioedema  . Hydromorphone Hcl     REACTION: diffuse rash  . Propoxyphene-Acetaminophen     REACTION: itching  . Vicodin (Hydrocodone-Acetaminophen)     itching     Past Medical History: Diagnosis Date  . Hyperlipidemia   . Hypertension   . Gout   . Obesity   . Depression   . DJD (degenerative joint disease)   . OSA (obstructive sleep apnea)     previously used CPAP, has lost  . CAD (coronary artery disease)     s/p non-Q wave MI in 06/2001 and 2004, 2005  . ALCOHOL ABUSE 12/13/2005    Annotation: Sober since 11/06 Qualifier: Diagnosis of  By: Wallace Cullens MD, Natalia Leatherwood    . INTRINSIC ASTHMA, WITH EXACERBATION 09/27/2009    Qualifier: Diagnosis of  By: Denton Meek MD, Tillie Rung    . Chronic kidney disease (CKD), stage III (moderate) 09/19/2010    Past Surgical History: Past Surgical History  Procedure Date  . Partial hysterectomy     due to endometriosis  . Tubal ligation   . Cardiac catheterization     06/2001, 02/2002, 10/2004 (10-15% proximal stenosis of circumflex, diffuse disease of  OM1 and RCA)    Family History: Family History  Problem Relation Age of Onset    . Mental illness Mother   . Heart disease Father   . Diabetes Sister   . Diabetes Sister   . Diabetes Sister     Social History: History   Social History  . Marital Status: Single    Spouse Name: N/A    Number of Children: 3  . Years of Education: GED   Occupational History  . retired     previously worked as a Hospital doctor   Social History Main Topics  . Smoking status: Never Smoker   . Smokeless tobacco: Not on file  . Alcohol Use: No  . Drug Use: No  . Sexually Active: Not on file   Other Topics Concern  . Not on file   Social History Narrative   Lives alone here in [place].  Vital Signs: Blood pressure 154/84, pulse 53, temperature 97.9 F (36.6 C), temperature source Oral, resp. rate 18, SpO2 96.00%.  Physical Exam: General: Vital signs reviewed and noted. Well-developed, well-nourished, in no acute distress; alert, appropriate and cooperative throughout examination.  Head: Normocephalic, atraumatic.  Eyes: PERRL, EOMI, No signs of anemia or jaundince.  Nose: Mucous membranes moist, not inflammed, nonerythematous.  Throat: Oropharynx nonerythematous, no exudate appreciated.   Neck: No deformities, masses, or tenderness noted.Supple, no JVD.  Lungs:  Normal respiratory effort. Clear to auscultation BL without crackles or wheezes.  Heart: RRR. S1 and S2 normal without gallop, murmur, or rubs. Chest wall pain to palpation, reproducible pain.  Abdomen:  BS normoactive. Soft, tenderness to palpation of BL LQ.  No masses or organomegaly.  Genital Exam: No external lesions identified. Creamy white-grey discharge noted in vaginal vault, no friability of tissues. (+) cervical motion tenderness.  Extremities: No pretibial edema.  Neurologic: A&O X3, CN II - XII are grossly intact. Motor strength is 5/5 in the all 4 extremities, Sensations intact to light touch    Lab results: Basic Metabolic Panel:  [facility] 07/13/11 1548  NA 138  K 4.8  CL 103  CO2  27  GLUCOSE 99  BUN 26*  CREATININE 1.44*  CALCIUM 9.3  MG --  PHOS --   Liver Function Tests:  Basename 07/13/11 1548  AST 16  ALT 11  ALKPHOS 135*  BILITOT 0.3  PROT 8.1  ALBUMIN 3.6    Basename 07/13/11 1548  LIPASE 39  AMYLASE --    CBC:  Basename 07/13/11 1548  WBC 4.2  NEUTROABS 2.4  HGB 13.1  HCT 39.9  MCV 91.3  PLT 238   Cardiac Enzymes:  Basename 07/13/11 1548  CKTOTAL --  CKMB --  CKMBINDEX --  TROPONINI <0.30   Urinalysis: None.  Historical labs: Lab Results  Component Value Date   CHOL 161 05/01/2011   HDL 46 05/01/2011   LDLCALC 98 05/01/2011   TRIG 85 05/01/2011   CHOLHDL 3.5 05/01/2011   Imaging results:   Abdominal XR (07/10/2011) - 1. Cardiomegaly. No active lung disease. 2. No bowel obstruction. No free air. 3. Probable degenerative change in the right hip superiorly. Cannot exclude avascular necrosis.   Assessment & Plan:  Pt is a 61 y.o. yo female with a PMHx of CAD, OSA (previously on CPAP), CKDIII, depression who was admitted on 07/13/2011 with symptoms of lower BL abdominal pain x 5 days. At this time, cause of this patient's symptoms is unclear, and will continue to pursue workup.   1) Abdominal pain - unclear etiology, however, most concerning for pelvic inflammatory disease given hx of BL lower quadrant abdominal pain, vaginal exam showing mucopurulent discharge, and cervical motion tenderness. Recent urinalysis on 05/14 was negative for hematuria, LE, nitrites, therefore, less likely consistent with UTI. She has had Abd [name] in 09/2010 that was negative for gallstones, gallbladder wall thickening, or pericholecystic fluid, as well, CMET, lipase is within normal limits, therefore, acute gallbladder or pancreatic pathology are not suspected. As well, states she has stopped drinking alcohol all together and given BL LQ distribution of pain, acute gastritis is very unlikely to be cause of his pain. Unfortunately, I cannot find the colonoscopy  report that the patient reports as normal. Given lack of response to cipro/flagyl therapy, may not have diverticulitis, as would have expected response by this time given that she was on appropriate therapy.   Plan: - Continue Flagyl (500 mg twice  a day for 14 days total). - Will add ceftriazxone (250mg  IM x1) + doxycycline 100mg  BID x 14 days. - Will check urine gc/ chlamydia - Wet prep ordered with pelvic exam performed in the clinic --> await results.  - Check HIV. - Check CT abdomen/ pelvis for acute abdominal pathology - will do oral contrast only, as patient has CKD. - Tylenol #3 for pain, which she states works well for her. - PRN antiemetics. - Recheck UA.  2) Chest pain - likely MSK pain, as pain is very reproducible on exam. However, given hx of CAD with prior silent MI, and current presentation of nonspecific abdominal pain and chest pain, could have an atypical presentation of ACS, although not expected. - Will check CE x 3 - Check EKG  3) HLD - last LDL was 98. She is not currently on a statin therapy despite diagnosis of CAD. - Consider start statin either now, or as an outpatient.   4) HTN - acutely elevated, likely secondary to pain. Previously well controlled on home regimen.   5) CKD, III without acute component - difficult to assess baseline, 1.3-1.9 since 09/2010. Thought previously secondary to prolonged history of poorly controlled hypertension.  - Continue to monitor. - Attempt to avoid nephrotoxic agents as possible.   6) Depression - patient reluctant to give specifics, but states somewhat stable, and she is without SI/HI at present. - Continue home regimen  7) Intrinsic asthma without exacerbation - continue home flovent, add PRN albuterol.   DVT PPX - heparin   Johnette Abraham, D.O.  PGY-II, Internal Medicine Resident 07/13/2011, 8:29 PM

## 2011-07-13 NOTE — Progress Notes (Signed)
Addended by: Priscella Mann on: 07/13/2011 04:43 PM   Modules accepted: Orders

## 2011-07-14 ENCOUNTER — Inpatient Hospital Stay (HOSPITAL_COMMUNITY): Payer: Medicare Other

## 2011-07-14 ENCOUNTER — Other Ambulatory Visit: Payer: Self-pay

## 2011-07-14 DIAGNOSIS — R1084 Generalized abdominal pain: Secondary | ICD-10-CM | POA: Diagnosis not present

## 2011-07-14 LAB — RAPID URINE DRUG SCREEN, HOSP PERFORMED
Amphetamines: NOT DETECTED
Benzodiazepines: NOT DETECTED
Tetrahydrocannabinol: NOT DETECTED

## 2011-07-14 LAB — URINALYSIS, MICROSCOPIC ONLY
Glucose, UA: NEGATIVE mg/dL
Ketones, ur: NEGATIVE mg/dL
Protein, ur: NEGATIVE mg/dL
Urobilinogen, UA: 0.2 mg/dL (ref 0.0–1.0)

## 2011-07-14 LAB — CARDIAC PANEL(CRET KIN+CKTOT+MB+TROPI)
CK, MB: 1.6 ng/mL (ref 0.3–4.0)
CK, MB: 1.9 ng/mL (ref 0.3–4.0)
Relative Index: INVALID (ref 0.0–2.5)
Total CK: 69 U/L (ref 7–177)
Troponin I: <0.3 ng/mL (ref <0.30)

## 2011-07-14 LAB — HIV ANTIBODY (ROUTINE TESTING W REFLEX): HIV: NONREACTIVE

## 2011-07-14 MED ORDER — IOHEXOL 300 MG/ML  SOLN: 100.0000 mL | Freq: Once | INTRAMUSCULAR | Status: AC | PRN

## 2011-07-14 MED ORDER — ONDANSETRON HCL 4 MG PO TABS
4.0000 mg | ORAL_TABLET | Freq: Three times a day (TID) | ORAL | Status: DC
  Administered 2011-07-14 – 2011-07-15 (×3): 4 mg via ORAL
  Filled 2011-07-14 (×6): qty 1

## 2011-07-14 NOTE — Progress Notes (Signed)
Subjective:    Currently, the patient states she continues to have abdominal pain, only mildly improved from prior. Unfortunately, she vomiting after eating. No fever, chills, diarrhea.   Interval Events:  CT abd negative (although admittedly limited study secondary to oral contrast only)  CE negative x 3, EKG wnl.   Objective:    Vital Signs:   Temp:  [97.1 F (36.2 C)-98.9 F (37.2 C)] 98.2 F (36.8 C) (05/18 0606) Pulse Rate:  [53-82] 58  (05/18 0606) Resp:  [18-20] 20  (05/18 0606) BP: (132-181)/(69-110) 147/89 mmHg (05/18 0606) SpO2:  [96 %-99 %] 97 % (05/18 0927) Weight:  [249 lb 1.6 oz (112.991 kg)-254 lb 3.2 oz (115.304 kg)] 249 lb 1.6 oz (112.991 kg) (05/18 0606) Last BM Date: 07/12/11  24-hour weight change: Weight change:   Intake/Output:   Intake/Output Summary (Last 24 hours) at 07/14/11 1057 Last data filed at 07/14/11 0543  Gross per 24 hour  Intake    800 ml  Output    950 ml  Net   -150 ml      Physical Exam: General: Vital signs reviewed and noted. Well-developed, well-nourished, in no acute distress; alert, appropriate and cooperative throughout examination.  Lungs:  Normal respiratory effort. Clear to auscultation BL without crackles or wheezes.  Heart: RRR. S1 and S2 normal without gallop, murmur, or rubs.  Abdomen:  BS normoactive. Soft, Nondistended, BL LQ abdominal pain. No masses or organomegaly.  Extremities: No pretibial edema.     Labs:  Basic Metabolic Panel:  Lab 07/13/11 1610 07/10/11 1450  NA 138 135  K 4.8 4.6  CL 103 101  CO2 27 23  GLUCOSE 99 100*  BUN 26* 28*  CREATININE 1.44* 1.72*  CALCIUM 9.3 9.1  MG -- --  PHOS -- --    Liver Function Tests:  Lab 07/13/11 1548 07/10/11 1450  AST 16 14  ALT 11 9  ALKPHOS 135* 124*  BILITOT 0.3 0.6  PROT 8.1 8.1  ALBUMIN 3.6 3.7    Lab 07/13/11 1548 07/10/11 1450  LIPASE 39 43  AMYLASE -- --    CBC:  Lab 07/13/11 1548 07/10/11 1450  WBC 4.2 6.0  NEUTROABS 2.4  --  HGB 13.1 12.9  HCT 39.9 37.6  MCV 91.3 89.7  PLT 238 265    Cardiac Enzymes:  Lab 07/14/11 0852 07/14/11 0232 07/13/11 2046 07/13/11 1548  CKTOTAL 69 70 74 --  CKMB 1.9 1.6 1.7 --  CKMBINDEX -- -- -- --  TROPONINI <0.30 <0.30 <0.30 <0.30     Other results: EKG: normal EKG, normal sinus rhythm, unchanged from previous tracings, mild bradycardia.  Imaging:  Ct Abdomen Pelvis Wo Contrast (07/14/2011) - Within limitations of a noncontrast examination, no acute CT abnormality identified.  Original Report Authenticated By: Waneta Martins, M.D.     Medications:    Infusions:    Scheduled Medications:    . allopurinol  100 mg Oral Daily  . aspirin EC  81 mg Oral Daily  . buPROPion  150 mg Oral Daily  . cefTRIAXone (ROCEPHIN) IM  250 mg Intramuscular Q24H  . citalopram  40 mg Oral Daily  . doxycycline  100 mg Oral Q12H  . fesoterodine  4 mg Oral Daily  . fluticasone  1 puff Inhalation BID  . heparin  5,000 Units Subcutaneous Q8H  . iohexol  20 mL Oral Q1 Hr x 2  . lamoTRIgine  100 mg Oral Daily  . losartan  100 mg Oral  BID  . metoprolol  50 mg Oral BID  . metroNIDAZOLE  500 mg Oral TID  . pantoprazole  40 mg Oral Q1200  . sodium chloride  3 mL Intravenous Q12H  . DISCONTD: Acetaminophen-Codeine  1-2 tablet Oral Q4H  . DISCONTD: aspirin  81 mg Oral Daily  . DISCONTD: ciprofloxacin  500 mg Oral BID  . DISCONTD: sodium chloride  3 mL Intravenous Q12H    PRN Medications: sodium chloride, acetaminophen-codeine, diphenhydrAMINE, iohexol, nitroGLYCERIN, ondansetron, sodium chloride, DISCONTD:  morphine injection, DISCONTD: nitroGLYCERIN   Assessment/ Plan:    Pt is a 61 y.o. yo female with a PMHx of CAD, OSA (previously on CPAP), CKDIII, depression who was admitted on 07/13/2011 with symptoms of lower BL abdominal pain x 5 days, thought to be most consistent with PID.  1) Abdominal pain - unclear etiology, however, most consistent for vaginitis / pelvic  inflammatory disease given hx of BL lower quadrant abdominal pain, vaginal exam showing mucopurulent discharge, and cervical motion tenderness. Although UA shows bacteria, there are < 10 WBC and pt is without dysuria, hematuria - therefore, UTI less likely contributing. CT abd negative for acute pathology. She has had Abd [name] in 09/2010 that was negative for gallstones, gallbladder wall thickening, or pericholecystic fluid, as well, CMET, lipase is within normal limits, therefore, acute gallbladder or pancreatic pathology are not suspected. As well, states she has stopped drinking alcohol all together and given BL LQ distribution of pain, acute gastritis is very unlikely to be cause of his pain.  Plan:  - Continue Flagyl (500 mg twice a day for 14 days total) + doxycycline 100mg  BID x 14 days (has already received Rocephin x 1 dose) - Pending urine gc/ chlamydia  - Wet prep ordered with pelvic exam performed in the clinic --> await results.  - Check HIV.  - Check CT abdomen/ pelvis for acute abdominal pathology - will do oral contrast only, as patient has CKD.  - Tylenol #3 for pain - PRN antiemetics --> will add as scheduled prior to her meals. - Recheck  Urine culture  2) Chest pain - likely MSK pain, as pain is very reproducible on exam. ACS ruled out with negative CE x 3, and EKG without acute ST/T changes.  3) HLD - last LDL was 98. She is not currently on a statin therapy despite diagnosis of CAD.  - Consider as an outpatient.   4) HTN - acutely elevated, likely secondary to pain. Previously well controlled on home regimen.   5) CKD, III without acute component - difficult to assess baseline, 1.3-1.9 since 09/2010. Thought previously secondary to prolonged history of poorly controlled hypertension.  - Continue to monitor.  - Attempt to avoid nephrotoxic agents as possible.   6) Depression - patient reluctant to give specifics, but states somewhat stable, and she is without SI/HI at  present.  - Continue home regimen   7) Intrinsic asthma without exacerbation  - continue home flovent, add PRN albuterol.  8) Dispo - patient with nausea / vomiting today, therefore, not ready for dc. Would like to see her be able to tolerate oral foods and medications. Therefore, will schedule antiemetic prior to meal. Plan for dc tomorrow if stable overnight, and tolerating meals. Will need bus pass or cab fare for ride home. Will consult SW.    DVT PPX - heparin  Length of Stay: 1 days   Johnette Abraham, Norman Herrlich, Internal Medicine Resident 07/14/2011, 10:57 AM

## 2011-07-14 NOTE — Progress Notes (Signed)
CSW met with Pt re: concern for transport home.  PT stated she would call and pay for a cab.  CSW signing off. Milus Banister MSW,LCSW w/e Coverage [phone]

## 2011-07-14 NOTE — H&P (Signed)
On-Call Internal Medicine Attending Admission Note Date: 07/14/2011  Patient name: Vanessa Palmer Medical record number: [id number] Date of birth: December 02, 1950 Age: 61 y.o. Gender: female  I saw and evaluated the patient. I reviewed the resident's note and I agree with the resident's findings and plan as documented in the resident's note, with additional comments as noted below.  Chief Complaint(s): Abdominal pain  History - key components related to admission: Patient is a 61 year old woman with a history of  Hypertension, hyperlipidemia, gout, obesity, depression, DJD, obstructive sleep apnea, coronary artery disease, and asthma, admitted with complaint of mid abdominal pain which began about 5 days prior to admission.  Patient was seen on 07/10/2011 in the clinic and treated with oral ciprofloxacin and Flagyl for presumed diverticulitis; when she returned on 07/13/2011, her abdominal pain had improved a little but had not resolved.  She has had some associated nausea, but no vomiting; she denies diarrhea, bright red blood per rectum, melena, fever, or chills; she also denies dysuria or hematuria.   Physical Exam - key components related to admission:  Filed Vitals:   07/13/11 2344 07/14/11 0238 07/14/11 0606 07/14/11 0927  BP: 157/110 132/69 147/89   Pulse: 60 59 58   Temp:  97.9 F (36.6 C) 98.2 F (36.8 C)   TempSrc:  Oral Oral   Resp: 20 20 20    Height:      Weight:   249 lb 1.6 oz (112.991 kg)   SpO2: 98% 97% 98% 97%   General: Alert, no distress Lungs: Clear Back: No CVA tenderness Heart: Regular; no extra sounds or murmurs Abdomen: Bowel sounds normal; moderate lower abdominal tenderness, no rebound Extremities: No edema  Lab results:   Basic Metabolic Panel:  Basename 07/13/11 1548  NA 138  K 4.8  CL 103  CO2 27  GLUCOSE 99  BUN 26*  CREATININE 1.44*  CALCIUM 9.3  MG --  PHOS --   Liver Function Tests:  Basename 07/13/11 1548  AST 16  ALT 11  ALKPHOS  135*  BILITOT 0.3  PROT 8.1  ALBUMIN 3.6    Basename 07/13/11 1548  LIPASE 39  AMYLASE --    CBC:  Basename 07/13/11 1548  WBC 4.2  NEUTROABS 2.4  HGB 13.1  HCT 39.9  MCV 91.3  PLT 238   Cardiac Enzymes:  Basename 07/14/11 0852 07/14/11 0232 07/13/11 2046  CKTOTAL 69 70 74  CKMB 1.9 1.6 1.7  CKMBINDEX -- -- --  TROPONINI <0.30 <0.30 <0.30    Urine Drug Screen: Drugs of Abuse     Component Value Date/Time   LABOPIA NONE DETECTED 07/14/2011 0016   COCAINSCRNUR NONE DETECTED 07/14/2011 0016   LABBENZ NONE DETECTED 07/14/2011 0016   AMPHETMU NONE DETECTED 07/14/2011 0016   THCU NONE DETECTED 07/14/2011 0016   LABBARB NONE DETECTED 07/14/2011 0016     Urinalysis    Component Value Date/Time   COLORURINE YELLOW 07/14/2011 0016   APPEARANCEUR CLOUDY* 07/14/2011 0016   LABSPEC 1.004* 07/14/2011 0016   PHURINE 7.0 07/14/2011 0016   GLUCOSEU NEGATIVE 07/14/2011 0016   HGBUR SMALL* 07/14/2011 0016   BILIRUBINUR NEGATIVE 07/14/2011 0016   BILIRUBINUR negative 07/10/2011 1449   KETONESUR NEGATIVE 07/14/2011 0016   PROTEINUR NEGATIVE 07/14/2011 0016   UROBILINOGEN 0.2 07/14/2011 0016   UROBILINOGEN 4.0 07/10/2011 1449   NITRITE NEGATIVE 07/14/2011 0016   NITRITE negative 07/10/2011 1449   LEUKOCYTESUR TRACE* 07/14/2011 0016       Urine WBC: 0-2  Urine RBC: 3-6     Urine squamous epithelial: rare     Urine bacteria: many  Miscellaneous Labs: Lactic Acid, Venous: 2.3   Imaging results:  Ct Abdomen Pelvis Wo Contrast  07/14/2011  *RADIOLOGY REPORT*  Clinical Data: Diffuse abdominal pain  CT ABDOMEN AND PELVIS WITHOUT CONTRAST  Technique:  Multidetector CT imaging of the abdomen and pelvis was performed following the standard protocol without intravenous contrast.  Comparison: 07/10/2011 radiograph 08/11/2010 CT  Findings: Mild lung base atelectasis versus scarring on the left. Otherwise lung bases are predominately clear.  Heart size is enlarged.  No pleural or pericardial  effusion.  Intra-abdominal organ evaluation is limited in the absence of intravenous contrast.  Within this limitation, unremarkable liver, biliary system, spleen, pancreas, adrenal glands, kidneys.  No hydronephrosis or hydroureter.  No bowel obstruction.  No CT evidence for colitis.  Normal appendix.  No free intraperitoneal air or fluid.  No lymphadenopathy.  Normal caliber vasculature.  Decompressed bladder limits evaluation.  Absent uterus.  No adnexal mass.  Right femoroacetabular degenerative changes and questionable erosive changes of the SI joints, unchanged.  No acute osseous abnormality.  Multilevel flowing anterior osteophytes of the lower thoracic spine.  IMPRESSION: Within limitations of a noncontrast examination, no acute CT abnormality identified.  Original Report Authenticated By: Waneta Martins, M.D.    Other results: EKG: Pending  Assessment & Plan by Problem:  1.  Abdominal pain.  The etiology of patient's abdominal pain is not clear; a CT scan without IV contrast (because of patient's chronic renal insufficiency) was unremarkable.  Her alkaline phosphatase was mildly elevated, but there is no evidence of gallstones.  Her urinalysis is not convincing for urinary tract infection, although this is possible.  Her pelvic exam per resident raised question of vaginitis.  Patient's abdominal tenderness has improved somewhat compared to yesterday, but she is still tender in the lower abdomen.  Plan is empiric antibiotic treatment with Flagyl and doxycycline following an IM dose of ceftriaxone; culture urine; recheck lactic acid level today; advance diet.  If labs are unremarkable and she is doing well clinically, possible discharge home later today on oral antibiotics with followup in clinic next week.  2.  Episode of substernal chest pain.  Patient had an episode of a few minutes of substernal chest pain which resolved.  Her cardiac enzymes are negative.  Would check an EKG, which is  pending.  If she remains asymptomatic and EKG shows no change, then would not pursue further work-up as inpatient.  3.  Chronic kidney disease.  Patient's creatinine is actually better than previous baseline.    4.  Hypertension.  Patient's blood pressure is improved today.  Continue home regimen.

## 2011-07-15 MED ORDER — METRONIDAZOLE 500 MG PO TABS: 500.0000 mg | ORAL_TABLET | Freq: Three times a day (TID) | ORAL | Status: DC

## 2011-07-15 MED ORDER — ACETAMINOPHEN-CODEINE #3 300-30 MG PO TABS: 1.0000 | ORAL_TABLET | Freq: Four times a day (QID) | ORAL | Status: DC | PRN

## 2011-07-15 MED ORDER — DOXYCYCLINE HYCLATE 100 MG PO TABS: 100.0000 mg | ORAL_TABLET | Freq: Two times a day (BID) | ORAL | Status: DC

## 2011-07-15 MED ORDER — DOXYCYCLINE HYCLATE 100 MG PO TABS
100.0000 mg | ORAL_TABLET | ORAL | Status: AC
  Administered 2011-07-15: 100 mg via ORAL
  Filled 2011-07-15: qty 1

## 2011-07-15 MED ORDER — ONDANSETRON HCL 4 MG PO TABS: 4.0000 mg | ORAL_TABLET | Freq: Three times a day (TID) | ORAL | Status: DC | PRN

## 2011-07-15 MED ORDER — METRONIDAZOLE 500 MG PO TABS: 500.0000 mg | ORAL_TABLET | Freq: Three times a day (TID) | ORAL | Status: AC

## 2011-07-15 MED ORDER — ONDANSETRON HCL 4 MG PO TABS: 4.0000 mg | ORAL_TABLET | Freq: Three times a day (TID) | ORAL | Status: AC | PRN

## 2011-07-15 NOTE — Progress Notes (Signed)
Subjective:    Currently, the patient is feeling well, states her abdominal pain has significantly improved. Although the nausea is persistent, it is much more mild than yesterday, and she was able to easily consume full breakfast and dinner without difficulty. No fevers, chills, diarrhea, constipation, dysuria, hematuria.  Interval Events: - None.    Objective:    Vital Signs:   Temp:  [97.4 F (36.3 C)-97.8 F (36.6 C)] 97.4 F (36.3 C) (05/19 0700) Pulse Rate:  [53-61] 56  (05/19 1135) Resp:  [20-21] 20  (05/19 0700) BP: (132-144)/(72-87) 136/87 mmHg (05/19 1135) SpO2:  [20 %-99 %] 98 % (05/19 1005) Weight:  [249 lb 5.4 oz (113.1 kg)] 249 lb 5.4 oz (113.1 kg) (05/19 0700) Last BM Date: 07/14/11  24-hour weight change: Weight change: -5.8 oz (-0.163 kg)  Intake/Output:   Intake/Output Summary (Last 24 hours) at 07/15/11 1154 Last data filed at 07/15/11 1041  Gross per 24 hour  Intake   1060 ml  Output   1375 ml  Net   -315 ml      Physical Exam: General: Vital signs reviewed and noted. Well-developed, well-nourished, in no acute distress; alert, appropriate and cooperative throughout examination.  Lungs:  Normal respiratory effort. Clear to auscultation BL without crackles or wheezes.  Heart: RRR. S1 and S2 normal without gallop, murmur, or rubs.  Abdomen:  BS normoactive. Soft, Nondistended, BL LQ abdominal pain - improved from yesterday. No masses or organomegaly.  Extremities: No pretibial edema.     Labs:  Basic Metabolic Panel:  Lab 07/13/11 2956 07/10/11 1450  NA 138 135  K 4.8 4.6  CL 103 101  CO2 27 23  GLUCOSE 99 100*  BUN 26* 28*  CREATININE 1.44* 1.72*  CALCIUM 9.3 9.1  MG -- --  PHOS -- --    Liver Function Tests:  Lab 07/13/11 1548 07/10/11 1450  AST 16 14  ALT 11 9  ALKPHOS 135* 124*  BILITOT 0.3 0.6  PROT 8.1 8.1  ALBUMIN 3.6 3.7    Lab 07/13/11 1548 07/10/11 1450  LIPASE 39 43  AMYLASE -- --    CBC:  Lab 07/13/11 1548  07/10/11 1450  WBC 4.2 6.0  NEUTROABS 2.4 --  HGB 13.1 12.9  HCT 39.9 37.6  MCV 91.3 89.7  PLT 238 265    Cardiac Enzymes:  Lab 07/14/11 0852 07/14/11 0232 07/13/11 2046 07/13/11 1548  CKTOTAL 69 70 74 --  CKMB 1.9 1.6 1.7 --  CKMBINDEX -- -- -- --  TROPONINI <0.30 <0.30 <0.30 <0.30     Other results: EKG: normal EKG, normal sinus rhythm, unchanged from previous tracings, mild bradycardia.  Imaging:  Ct Abdomen Pelvis Wo Contrast (07/14/2011) - Within limitations of a noncontrast examination, no acute CT abnormality identified.  Original Report Authenticated By: Waneta Martins, M.D.     Medications:    Infusions:    Scheduled Medications:    . allopurinol  100 mg Oral Daily  . aspirin EC  81 mg Oral Daily  . buPROPion  150 mg Oral Daily  . citalopram  40 mg Oral Daily  . doxycycline  100 mg Oral Q12H  . fesoterodine  4 mg Oral Daily  . fluticasone  1 puff Inhalation BID  . heparin  5,000 Units Subcutaneous Q8H  . lamoTRIgine  100 mg Oral Daily  . losartan  100 mg Oral BID  . metoprolol  50 mg Oral BID  . metroNIDAZOLE  500 mg Oral TID  .  ondansetron  4 mg Oral TID WC  . pantoprazole  40 mg Oral Q1200  . sodium chloride  3 mL Intravenous Q12H    PRN Medications: sodium chloride, acetaminophen-codeine, diphenhydrAMINE, iohexol, nitroGLYCERIN, sodium chloride, DISCONTD: ondansetron   Assessment/ Plan:    Pt is a 61 y.o. yo female with a PMHx of CAD, OSA (previously on CPAP), CKDIII, depression who was admitted on 07/13/2011 with symptoms of lower BL abdominal pain x 5 days, thought to be most consistent with PID.  1) Abdominal pain - unclear etiology, however, most consistent for vaginitis / pelvic inflammatory disease given hx of BL lower quadrant abdominal pain, vaginal exam showing mucopurulent discharge, and cervical motion tenderness. Although UA shows bacteria, there are < 10 WBC and pt is without dysuria, hematuria - therefore, UTI less likely  contributing. CT abd negative for acute pathology. HIV Ab is nonreactive. She has had Abd [name] in 09/2010 that was negative for gallstones, gallbladder wall thickening, or pericholecystic fluid, as well, CMET, lipase is within normal limits, therefore, acute gallbladder or pancreatic pathology are not suspected. As well, states she has stopped drinking alcohol all together and given BL LQ distribution of pain, acute gastritis is very unlikely to be cause of his pain.  Plan:  - Continue Flagyl (500 mg twice a day for 14 days total) + doxycycline 100mg  BID x 14 days (has already received Rocephin x 1 dose) - Pending urine gc/ chlamydia - will followup as outpatient - Wet prep ordered with pelvic exam performed in the clinic --> await results.  - Tylenol #3 for pain - PRN antiemetics --> will add as scheduled prior to her meals. - Recheck  Urine culture - pending.  2) Chest pain - likely MSK pain, as pain is very reproducible on exam. ACS ruled out with negative CE x 3, and EKG without acute ST/T changes.  3) HLD - last LDL was 98. She is not currently on a statin therapy despite diagnosis of CAD.  - Consider as an outpatient.   4) HTN - acutely elevated, likely secondary to pain. Previously well controlled on home regimen.   5) CKD, III without acute component - difficult to assess baseline, 1.3-1.9 since 09/2010. Thought previously secondary to prolonged history of poorly controlled hypertension.  - Continue to monitor.  - Attempt to avoid nephrotoxic agents as possible.   6) Depression - patient reluctant to give specifics, but states somewhat stable, and she is without SI/HI at present.  - Continue home regimen   7) Intrinsic asthma without exacerbation  - continue home flovent, add PRN albuterol.  8) Dispo - home today. Spoke with pharmacy, ok to provide her total 200mg  doxy before discharge today. Already has her flagyl at home.    DVT PPX - heparin  Length of Stay: 2  days   Johnette Abraham, Norman Herrlich, Internal Medicine Resident 07/15/2011, 11:54 AM

## 2011-07-15 NOTE — Discharge Instructions (Signed)
   Thank you for allowing [name] to be involved in your healthcare while you were hospitalized at [facility]   Please note that there have been changes to your home medications. Specifically, as follows: 1. STOP Ciprofloxacin 2. START Doxycycline - two times daily for next 12 days (next dose will be needed tomorrow 07/16/2011) - for your infection 3. CONTINUE Flagyl (metronidazole) - take three times daily (use your current bottle ONLY today 07/15/2011). Starting tomorrow 07/16/2011, get the new prescription filled, and start the new bottle of Flagyl (STOP the old), and you will need to continue for 12 days total. 4. START Zofran - this is an as needed medication used for nausea, you can take it before a meal if getting nauseous. 5. START Tylenol #3 for pain - keep away from children, you can take a MAXIMUM of 12 tablets/ a day, but try to take as little as needed.   You will be called for an appointment within the next 1-2 days. to be set up for an appointment at the end of this week. If you are not called by Tuesday, please call the clinic at [phone] to set up an appointment for yourself.  Please make sure to get your prescriptions filled tomorrow (07/16/2011), and start them immediately.  Please call the clinic at [phone] if you have any questions or concerns, or any difficulty getting any of your medications.  Please return to the ER if you have worsening of your symptoms or new severe symptoms arise.

## 2011-07-15 NOTE — Progress Notes (Signed)
Verbalized understanding of discharge instructions.  Client instructions related to how to take medications was reinforced as requested by physician.

## 2011-07-15 NOTE — Discharge Summary (Signed)
Patient Name:  Vanessa Palmer  MRN: [id number]  PCP: Deatra Robinson, MD  DOB:  1950-04-25       Date of Admission:  07/13/2011  Date of Discharge:  07/15/2011      Attending Physician: Dr. Margarito Liner         DISCHARGE DIAGNOSES: 1. Abdominal pain - likely secondary to PID / vaginitis. Wet prep, GC/ chlamydia pending at time of discharge. CT abdomen without contrast negative. Treated with Rocephin 250mg  x1, Flagyl, doxcycyline (to be continued for total of 14 days). 2. Atypical chest pain - likely MSK pain. ACS ruled out with negative CE x 3, and EKG without acute ST/T changes.  3. HLD - last LDL was 98. She is not currently on a statin therapy despite diagnosis of CAD. Consider as an outpatient.  4. HTN - acutely elevated, likely secondary to pain.Improved with pain control. Continued on home medications.   5. CKD, III without acute component - difficult to assess baseline, 1.3-1.9 since 09/2010. Thought previously secondary to prolonged history of poorly controlled hypertension.  6. Depression - stable without SI/ HI. 7. Intrinsic asthma without exacerbation - stable on home medications.  DISPOSITION AND FOLLOW-UP: Vanessa Palmer is to follow-up with the listed providers as detailed below, at which time, the following should be addressed:   1. HLD - please discuss with patient regarding recent LDL results showing she has LDL > 70, and discuss about starting tx.  2. Abdominal pain - please assess for resolution of symptoms, and that she was able to get appropriate medications at discharge. 3. Pending labs - please follow-up GC/ Chlamydia and wet-prep results, urine culture.    Follow-up Information    Follow up with [facility] in 1 week. (You will be called for an appointment within the next 1-2 days. to be set up for an appointment at the end of this week. If you are not called by Tuesday, please call the clinic at [phone] to set up an  appointment for yourself.)    Contact information:   [phone]        Discharge Orders    Future Appointments: Provider: Department: Dept Phone: Center:   08/07/2011 1:45 PM Denna Haggard, MD [facility] [phone] [facility]     Future Orders Please Complete By Expires   Diet - low sodium heart healthy      Increase activity slowly      Call MD for:  temperature >100.4      Call MD for:  persistant nausea and vomiting      Call MD for:  severe uncontrolled pain          DISCHARGE MEDICATIONS: Medication List  As of 07/15/2011 12:28 PM   STOP taking these medications         ciprofloxacin 500 MG tablet      polyethylene glycol packet         TAKE these medications         acetaminophen-codeine 300-30 MG per tablet   Commonly known as: TYLENOL #3   Take 1 tablet by mouth every 6 (six) hours as needed for pain (do not take more than 12 tablets per day - USED FOR PAIN).      allopurinol 100 MG tablet   Commonly known as: ZYLOPRIM   Take 1 tablet (100 mg total) by mouth daily.      aspirin 81 MG tablet   Take 81 mg  by mouth daily.      buPROPion 150 MG 24 hr tablet   Commonly known as: WELLBUTRIN XL   Take 1 tablet (150 mg total) by mouth every morning.      citalopram 40 MG tablet   Commonly known as: CELEXA   Take 1 tablet (40 mg total) by mouth daily.      doxycycline 100 MG tablet   Commonly known as: VIBRA-TABS   Take 1 tablet (100 mg total) by mouth every 12 (twelve) hours. ANTIBIOTIC FOR INFECTION - take for 12 more days.      fluticasone 44 MCG/ACT inhaler   Commonly known as: FLOVENT HFA   Inhale 1 puff into the lungs 2 (two) times daily.      lamoTRIgine 100 MG tablet   Commonly known as: LAMICTAL   Take 1 tablet (100 mg total) by mouth daily.      losartan 100 MG tablet   Commonly known as: COZAAR   Take 100 mg by mouth 2 (two) times daily.      metoprolol 50 MG tablet   Commonly known as: LOPRESSOR   Take 1 tablet (50 mg total) by  mouth 2 (two) times daily.      metroNIDAZOLE 500 MG tablet   Commonly known as: FLAGYL   Take 1 tablet (500 mg total) by mouth 3 (three) times daily.      nitroGLYCERIN 0.3 MG SL tablet   Commonly known as: NITROSTAT   Place 0.3 mg under the tongue every 5 (five) minutes as needed.      omeprazole 40 MG capsule   Commonly known as: PRILOSEC   Take one tablet by mouth q 12 hours      ondansetron 4 MG tablet   Commonly known as: ZOFRAN   Take 1 tablet (4 mg total) by mouth 3 (three) times daily as needed for nausea.      TOVIAZ 4 MG Tb24   Generic drug: fesoterodine   Take 4 mg by mouth daily.      TRUFORM STOCKINGS 30-40MMHG Misc   Apply to both legs while standing or walking             CONSULTS:    None.   PROCEDURES PERFORMED:   Ct Abdomen Pelvis Wo Contrast (07/14/2011) - Within limitations of a noncontrast examination, no acute CT abnormality identified.  Original Report Authenticated By: Waneta Martins, M.D.   Dg Abd Acute W/chest (07/10/2011) - 1.  Cardiomegaly.  No active lung disease. 2.  No bowel obstruction.  No free air. 3.  Probable degenerative change in the right hip superiorly. Cannot exclude avascular necrosis.  Original Report Authenticated By: Juline Patch, M.D.       ADMISSION DATA: H&P: Patient is a 61 y.o. female with a PMHx of HTN, HLD, Depression, who is admitted to [facility] as a direct admission from [facility] for evaluation of 5 day history of diffuse abdominal. On 05/142013, the patient was treated with cipro and flagyl as an outpatient for presumed diverticulitis. At that time, UA, CMET, lipase were within normal limits. Despite outpatient cipro/ flagyl, the patient's symptoms persisted. Therefore, she is admitted for continued workup. In regards to the patient's abdominal pain, she describes constant, nonradiating pain that is sharp and crampy in nature with associated nausea (but no vomiting). The pain is not affected by food intake - and the patient  has been eating and drinking normally. The patient is not constipated, she has been having regular  bowel movements, with last movement on the morning of admission. States she had a colonoscopy within the last year that was normal. She has noted increased malodorous vaginal discharge in her diaper. She is not currently sexually active with last encounter 6 years ago. Lastly, she denies any fever, rash, dysuria, hematuria.  Vanessa Palmer very quietly also mentions that she has had some substernal chest pain that started on the evening to admission, and self-resolved within 5 minutes. She states that the chest pain was not severe enough to require her to use her nitroglycerin. It was not associated with nausea or sweating. The patient has had 3 prior MI's - she had chest pain with 2, the other was silent. She cannot tell if the episode last night is the same as prior anginal episodes.   Physical Exam: Vital Signs:  Blood pressure 154/84, pulse 53, temperature 97.9 F (36.6 C), temperature source Oral, resp. rate 18, SpO2 96.00%.   Exam:  General:  Vital signs reviewed and noted. Well-developed, well-nourished, in no acute distress; alert, appropriate and cooperative throughout examination.   Head:  Normocephalic, atraumatic.   Eyes:  PERRL, EOMI, No signs of anemia or jaundince.   Nose:  Mucous membranes moist, not inflammed, nonerythematous.   Throat:  Oropharynx nonerythematous, no exudate appreciated.   Neck:  No deformities, masses, or tenderness noted.Supple, no JVD.   Lungs:  Normal respiratory effort. Clear to auscultation BL without crackles or wheezes.   Heart:  RRR. S1 and S2 normal without gallop, murmur, or rubs. Chest wall pain to palpation, reproducible pain.   Abdomen:  BS normoactive. Soft, tenderness to palpation of BL LQ. No masses or organomegaly.   Genital Exam:  No external lesions identified. Creamy white-grey discharge noted in vaginal vault, no friability of tissues. (+)  cervical motion tenderness.   Extremities:  No pretibial edema.   Neurologic:  A&O X3, CN II - XII are grossly intact. Motor strength is 5/5 in the all 4 extremities, Sensations intact to light touch     Labs: Basic Metabolic Panel:   Basename  07/13/11 1548   NA  138   K  4.8   CL  103   CO2  27   GLUCOSE  99   BUN  26*   CREATININE  1.44*   CALCIUM  9.3   MG  --   PHOS  --    Liver Function Tests:   Basename  07/13/11 1548   AST  16   ALT  11   ALKPHOS  135*   BILITOT  0.3   PROT  8.1   ALBUMIN  3.6     Basename  07/13/11 1548   LIPASE  39   AMYLASE  --    CBC:   Basename  07/13/11 1548   WBC  4.2   NEUTROABS  2.4   HGB  13.1   HCT  39.9   MCV  91.3   PLT  238    Cardiac Enzymes:   Basename  07/13/11 1548   CKTOTAL  --   CKMB  --   CKMBINDEX  --   TROPONINI  <0.30    Urinalysis:  None.   Historical labs:  Lab Results   Component  Value  Date    CHOL  161  05/01/2011    HDL  46  05/01/2011    LDLCALC  98  05/01/2011    TRIG  85  05/01/2011    CHOLHDL  3.5  05/01/2011      HOSPITAL COURSE: 1. Abdominal pain - thought to be most consistent with vaginitis / pelvic inflammatory disease given hx of BL lower quadrant abdominal pain, vaginal exam showing mucopurulent discharge, and cervical motion tenderness. Unfortunately, the wet prep, GC and chlamydia were pending at the time of discharge. However, pt was treated empirically to cover the most common pathogens - with Rocephin 250mg  x1, Doxycycline + Flagyl (which she will continue for 14 days total). UTI was also considered given UA showing bacteria, however, there are < 10 WBC and pt is without dysuria, hematuria - therefore, UTI thought to be less likely contributing. Urine culture was pending at time of discharge. CT abdomen was ordered given nonspecific complaints, and although limited in setting of oral contrast only, appeared to be negative for acute pathology. Patient's pain was treated with tylenol and  nausea treated with PRN zofran. Both of these symptoms significantly improved by time of discharge.  2. Atypical chest pain - likely MSK pain, as pain is very reproducible on exam. ACS ruled out with negative CE x 3, and EKG without acute ST/T changes.   3. HLD - last LDL was 98. She is not currently on a statin therapy despite diagnosis of CAD. Will need to be considered to initiate as an outpatient.   4. HTN - acutely elevated into 150-160 systolic on admission, likely secondary to pain. With pain management, blood pressures were stabilized. The patient was continued on her home regimen.    5. CKD, III without acute component - difficult to assess baseline, 1.3-1.9 since 09/2010. Thought previously secondary to prolonged history of poorly controlled hypertension. We attempted to avoid nephrotoxic agents as possible.   6. Depression - patient reluctant to give specifics, but states somewhat stable, and she was without SI/HI during hospital course. She was continued on her home regimen.   DISCHARGE DATA: Vital Signs: BP 136/87  Pulse 56  Temp(Src) 97.4 F (36.3 C) (Oral)  Resp 20  Ht 5\' 2"  (1.575 m)  Wt 249 lb 5.4 oz (113.1 kg)  BMI 45.60 kg/m2  SpO2 98%  Labs: No results found for this or any previous visit (from the past 24 hour(s)).   Time spent on discharge: 35 minutes   Signed: Johnette Abraham, D.O.  PGY II, Internal Medicine Resident 07/15/2011, 12:28 PM

## 2011-07-16 ENCOUNTER — Telehealth: Payer: Self-pay | Admitting: *Deleted

## 2011-07-16 DIAGNOSIS — Z9181 History of falling: Secondary | ICD-10-CM | POA: Diagnosis not present

## 2011-07-16 DIAGNOSIS — R269 Unspecified abnormalities of gait and mobility: Secondary | ICD-10-CM | POA: Diagnosis not present

## 2011-07-16 DIAGNOSIS — G4733 Obstructive sleep apnea (adult) (pediatric): Secondary | ICD-10-CM | POA: Diagnosis not present

## 2011-07-16 DIAGNOSIS — F329 Major depressive disorder, single episode, unspecified: Secondary | ICD-10-CM | POA: Diagnosis not present

## 2011-07-16 DIAGNOSIS — I10 Essential (primary) hypertension: Secondary | ICD-10-CM | POA: Diagnosis not present

## 2011-07-16 DIAGNOSIS — M25569 Pain in unspecified knee: Secondary | ICD-10-CM | POA: Diagnosis not present

## 2011-07-16 LAB — URINE CULTURE

## 2011-07-16 LAB — GC/CHLAMYDIA PROBE AMP, URINE: GC Probe Amp, Urine: NEGATIVE

## 2011-07-16 MED ORDER — DOXYCYCLINE HYCLATE 100 MG PO TABS: 100.0000 mg | ORAL_TABLET | Freq: Two times a day (BID) | ORAL | Status: DC

## 2011-07-16 MED ORDER — DOXYCYCLINE HYCLATE 100 MG PO TABS: 100.0000 mg | ORAL_TABLET | Freq: Two times a day (BID) | ORAL | Status: AC

## 2011-07-16 NOTE — Telephone Encounter (Signed)
[facility] stating doxycycline 100 mg is on back order.  Can you change to something else? Pharmacy # [phone] They can get most other antibiotics. Unable to reach Dr Saralyn Pilar at this time. Please advise

## 2011-07-16 NOTE — Telephone Encounter (Signed)
I called [facility], and they suggested trying the [facility] on [address] (apparently across from [facility]) at [phone].  I called [facility] and they do have doxycycline, so I submitted the prescription there.; please notify patient.

## 2011-07-17 NOTE — Telephone Encounter (Signed)
Pt informed

## 2011-07-18 DIAGNOSIS — R269 Unspecified abnormalities of gait and mobility: Secondary | ICD-10-CM | POA: Diagnosis not present

## 2011-07-18 DIAGNOSIS — F329 Major depressive disorder, single episode, unspecified: Secondary | ICD-10-CM | POA: Diagnosis not present

## 2011-07-18 DIAGNOSIS — I10 Essential (primary) hypertension: Secondary | ICD-10-CM | POA: Diagnosis not present

## 2011-07-18 DIAGNOSIS — G4733 Obstructive sleep apnea (adult) (pediatric): Secondary | ICD-10-CM | POA: Diagnosis not present

## 2011-07-18 DIAGNOSIS — Z9181 History of falling: Secondary | ICD-10-CM | POA: Diagnosis not present

## 2011-07-18 DIAGNOSIS — M25569 Pain in unspecified knee: Secondary | ICD-10-CM | POA: Diagnosis not present

## 2011-07-19 DIAGNOSIS — F329 Major depressive disorder, single episode, unspecified: Secondary | ICD-10-CM | POA: Diagnosis not present

## 2011-07-19 DIAGNOSIS — M25569 Pain in unspecified knee: Secondary | ICD-10-CM | POA: Diagnosis not present

## 2011-07-19 DIAGNOSIS — R269 Unspecified abnormalities of gait and mobility: Secondary | ICD-10-CM | POA: Diagnosis not present

## 2011-07-19 DIAGNOSIS — Z9181 History of falling: Secondary | ICD-10-CM | POA: Diagnosis not present

## 2011-07-19 DIAGNOSIS — I10 Essential (primary) hypertension: Secondary | ICD-10-CM | POA: Diagnosis not present

## 2011-07-19 DIAGNOSIS — G4733 Obstructive sleep apnea (adult) (pediatric): Secondary | ICD-10-CM | POA: Diagnosis not present

## 2011-07-24 DIAGNOSIS — G4733 Obstructive sleep apnea (adult) (pediatric): Secondary | ICD-10-CM | POA: Diagnosis not present

## 2011-07-24 DIAGNOSIS — R269 Unspecified abnormalities of gait and mobility: Secondary | ICD-10-CM | POA: Diagnosis not present

## 2011-07-24 DIAGNOSIS — M25569 Pain in unspecified knee: Secondary | ICD-10-CM | POA: Diagnosis not present

## 2011-07-24 DIAGNOSIS — I10 Essential (primary) hypertension: Secondary | ICD-10-CM | POA: Diagnosis not present

## 2011-07-24 DIAGNOSIS — F329 Major depressive disorder, single episode, unspecified: Secondary | ICD-10-CM | POA: Diagnosis not present

## 2011-07-24 DIAGNOSIS — Z9181 History of falling: Secondary | ICD-10-CM | POA: Diagnosis not present

## 2011-07-31 ENCOUNTER — Encounter: Payer: Medicare Other | Admitting: Internal Medicine

## 2011-08-01 DIAGNOSIS — N2581 Secondary hyperparathyroidism of renal origin: Secondary | ICD-10-CM | POA: Diagnosis not present

## 2011-08-01 DIAGNOSIS — N184 Chronic kidney disease, stage 4 (severe): Secondary | ICD-10-CM | POA: Diagnosis not present

## 2011-08-07 ENCOUNTER — Ambulatory Visit (INDEPENDENT_AMBULATORY_CARE_PROVIDER_SITE_OTHER): Payer: Medicare Other | Admitting: Internal Medicine

## 2011-08-07 ENCOUNTER — Encounter: Payer: Self-pay | Admitting: Internal Medicine

## 2011-08-07 VITALS — BP 166/98 | HR 64 | Temp 99.1°F | Ht 62.0 in | Wt 254.0 lb

## 2011-08-07 DIAGNOSIS — B373 Candidiasis of vulva and vagina: Secondary | ICD-10-CM

## 2011-08-07 DIAGNOSIS — I10 Essential (primary) hypertension: Secondary | ICD-10-CM

## 2011-08-07 DIAGNOSIS — I2510 Atherosclerotic heart disease of native coronary artery without angina pectoris: Secondary | ICD-10-CM | POA: Diagnosis not present

## 2011-08-07 DIAGNOSIS — B3731 Acute candidiasis of vulva and vagina: Secondary | ICD-10-CM | POA: Diagnosis not present

## 2011-08-07 MED ORDER — METOPROLOL TARTRATE 50 MG PO TABS: 100.0000 mg | ORAL_TABLET | Freq: Two times a day (BID) | ORAL | Status: DC

## 2011-08-07 MED ORDER — FLUCONAZOLE 100 MG PO TABS: 100.0000 mg | ORAL_TABLET | Freq: Every day | ORAL | Status: AC

## 2011-08-07 NOTE — Patient Instructions (Signed)
Please, pick up a prescription for Diflucan (yeast infection) from your pharmacy. Please, increase Metoprolol dose up to 100 mg tablet by mouth twice a day (new prescription was sent to pharmacy). Please, call with any questions and follow up in 2-3 months or sooner. Happy Summer!!!!

## 2011-08-07 NOTE — Progress Notes (Signed)
Patient ID: Vanessa Palmer, female   DOB: 1950-04-19, 61 y.o.   MRN: [id number] HPI:    1. Hospital f/u from a pelvic pain. Completed course of antibiotics.Reports resolution of her Sx. 2. C/o vaginal thick discharge with itching; denies any hematuria, vaginal bleeding, fever, chills or any other Sx. Review of Systems: Negative except per history of present illness  Physical Exam:  Nursing notes and vitals reviewed General:  alert, well-developed, and cooperative to examination.   Lungs:  normal respiratory effort, no accessory muscle use, normal breath sounds, no crackles, and no wheezes. Heart:  normal rate, regular rhythm, no murmurs, no gallop, and no rub.   Abdomen:  soft, non-tender, normal bowel sounds, no distention, no guarding, no rebound tenderness, no hepatomegaly, and no splenomegaly.   Extremities:  No cyanosis, clubbing, edema Neurologic:  alert & oriented X3, nonfocal exam  Meds: Current Outpatient Prescriptions on File Prior to Visit  Medication Sig Dispense Refill  . acetaminophen-codeine (TYLENOL #3) 300-30 MG per tablet Take 1 tablet by mouth every 6 (six) hours as needed for pain (do not take more than 12 tablets per day - USED FOR PAIN).  45 tablet  0  . allopurinol (ZYLOPRIM) 100 MG tablet Take 1 tablet (100 mg total) by mouth daily.  30 tablet  11  . aspirin 81 MG tablet Take 81 mg by mouth daily.        Marland Kitchen buPROPion (WELLBUTRIN XL) 150 MG 24 hr tablet Take 1 tablet (150 mg total) by mouth every morning.  30 tablet  2  . citalopram (CELEXA) 40 MG tablet Take 1 tablet (40 mg total) by mouth daily.  30 tablet  3  . Elastic Bandages & Supports (TRUFORM STOCKINGS 30-40MMHG) MISC Apply to both legs while standing or walking  2 each  1  . fesoterodine (TOVIAZ) 4 MG TB24 Take 4 mg by mouth daily.        . fluticasone (FLOVENT HFA) 44 MCG/ACT inhaler Inhale 1 puff into the lungs 2 (two) times daily.  1 Inhaler  6  . lamoTRIgine (LAMICTAL) 100 MG tablet Take 1 tablet (100 mg  total) by mouth daily.  30 tablet  3  . losartan (COZAAR) 100 MG tablet Take 100 mg by mouth 2 (two) times daily.      . metoprolol (LOPRESSOR) 50 MG tablet Take 1 tablet (50 mg total) by mouth 2 (two) times daily.  60 tablet  11  . nitroGLYCERIN (NITROSTAT) 0.3 MG SL tablet Place 0.3 mg under the tongue every 5 (five) minutes as needed.        Marland Kitchen omeprazole (PRILOSEC) 40 MG capsule Take one tablet by mouth q 12 hours  60 capsule  11    Allergies: Percocet; Ace inhibitors; Hydromorphone hcl; Propoxyphene-acetaminophen; and Vicodin Past Medical History  Diagnosis Date  . Hyperlipidemia   . Hypertension   . Gout   . Obesity   . Depression   . DJD (degenerative joint disease)   . OSA (obstructive sleep apnea)     previously used CPAP, has lost  . CAD (coronary artery disease)     s/p non-Q wave MI in 06/2001 and 2004, 2005  . ALCOHOL ABUSE 12/13/2005    Annotation: Sober since 11/06 Qualifier: Diagnosis of  By: Wallace Cullens MD, Natalia Leatherwood    . INTRINSIC ASTHMA, WITH EXACERBATION 09/27/2009    Qualifier: Diagnosis of  By: Denton Meek MD, Tillie Rung    . Chronic kidney disease (CKD), stage III (moderate) 09/19/2010   Past  Surgical History  Procedure Date  . Partial hysterectomy     due to endometriosis  . Tubal ligation   . Cardiac catheterization     06/2001, 02/2002, 10/2004 (10-15% proximal stenosis of circumflex, diffuse disease of  OM1 and RCA)   Family History  Problem Relation Age of Onset  . Mental illness Mother   . Heart disease Father   . Diabetes Sister   . Diabetes Sister   . Diabetes Sister    History   Social History  . Marital Status: Single    Spouse Name: N/A    Number of Children: 3  . Years of Education: GED   Occupational History  . retired     previously worked as a Hospital doctor   Social History Main Topics  . Smoking status: Never Smoker   . Smokeless tobacco: Not on file  . Alcohol Use: No  . Drug Use: No  . Sexually Active: Not on file   Other Topics  Concern  . Not on file   Social History Narrative   Lives alone here in [place].   A/P: 1. Vaginal monililoses, iatrogenic status post recent natibioitc use for a presumed PID. - Dilfucan PO x3 days -encouraged oral intake of yougurt with live cultures  2. HTN - needs a tighter control given CKD III -will increase metoprolol dose up to 100 mg PO bid -f/u with Dr. Caryn Section on Jun 14th, 2013.

## 2011-08-08 ENCOUNTER — Telehealth: Payer: Self-pay | Admitting: *Deleted

## 2011-08-08 NOTE — Telephone Encounter (Signed)
Pt states Rx were not at pharmacy from 08/07/11. Pt now uses [facility]. Rx for Diflucan and Metoprolol called in to [facility]. Stanton Kidney Jasen Hartstein RN 08/08/11 3PM.

## 2011-08-09 DIAGNOSIS — G4733 Obstructive sleep apnea (adult) (pediatric): Secondary | ICD-10-CM | POA: Diagnosis not present

## 2011-08-09 DIAGNOSIS — I10 Essential (primary) hypertension: Secondary | ICD-10-CM | POA: Diagnosis not present

## 2011-08-09 DIAGNOSIS — R269 Unspecified abnormalities of gait and mobility: Secondary | ICD-10-CM | POA: Diagnosis not present

## 2011-08-09 DIAGNOSIS — Z9181 History of falling: Secondary | ICD-10-CM | POA: Diagnosis not present

## 2011-08-09 DIAGNOSIS — M25569 Pain in unspecified knee: Secondary | ICD-10-CM | POA: Diagnosis not present

## 2011-08-09 DIAGNOSIS — F329 Major depressive disorder, single episode, unspecified: Secondary | ICD-10-CM | POA: Diagnosis not present

## 2011-08-10 DIAGNOSIS — I129 Hypertensive chronic kidney disease with stage 1 through stage 4 chronic kidney disease, or unspecified chronic kidney disease: Secondary | ICD-10-CM | POA: Diagnosis not present

## 2011-08-15 DIAGNOSIS — Z9181 History of falling: Secondary | ICD-10-CM | POA: Diagnosis not present

## 2011-08-15 DIAGNOSIS — G4733 Obstructive sleep apnea (adult) (pediatric): Secondary | ICD-10-CM | POA: Diagnosis not present

## 2011-08-15 DIAGNOSIS — M25569 Pain in unspecified knee: Secondary | ICD-10-CM | POA: Diagnosis not present

## 2011-08-15 DIAGNOSIS — F329 Major depressive disorder, single episode, unspecified: Secondary | ICD-10-CM | POA: Diagnosis not present

## 2011-08-15 DIAGNOSIS — R269 Unspecified abnormalities of gait and mobility: Secondary | ICD-10-CM | POA: Diagnosis not present

## 2011-08-15 DIAGNOSIS — I10 Essential (primary) hypertension: Secondary | ICD-10-CM | POA: Diagnosis not present

## 2011-08-16 ENCOUNTER — Telehealth: Payer: Self-pay | Admitting: *Deleted

## 2011-08-16 NOTE — Telephone Encounter (Signed)
Pt called and states she started taking Metoprolol a week ago ( 6/11 ) and each time she takes it she gets a medicine taste in her mouth and it makes her feel sick.  She takes meds after eating.  No Hx of this problem in the past. Please advise.  Pt # [phone]

## 2011-08-16 NOTE — Telephone Encounter (Signed)
Called pt. 2 days of bad taste in mouth, feels like she is going to vomit but doesn't. Occurs 2 hours after she takes a med but not certain which med is causing this. Metoprolol was increased on the 11th but shouldn't cause this. Sounds like acid reflux. On PPI. I encouraged her to get TUMS or mylanta and try it and if these sxs ares still present in a few days to call us back.

## 2011-08-20 NOTE — Telephone Encounter (Signed)
Opened in error

## 2011-08-29 NOTE — Addendum Note (Signed)
Addended by: Bufford Spikes on: 08/29/2011 12:03 PM   Modules accepted: Orders

## 2011-09-14 ENCOUNTER — Encounter: Payer: Self-pay | Admitting: Internal Medicine

## 2011-09-14 ENCOUNTER — Ambulatory Visit (INDEPENDENT_AMBULATORY_CARE_PROVIDER_SITE_OTHER): Payer: Medicare Other | Admitting: Internal Medicine

## 2011-09-14 VITALS — BP 149/95 | HR 59 | Temp 97.0°F | Ht 62.0 in | Wt 256.9 lb

## 2011-09-14 DIAGNOSIS — H109 Unspecified conjunctivitis: Secondary | ICD-10-CM

## 2011-09-14 DIAGNOSIS — I10 Essential (primary) hypertension: Secondary | ICD-10-CM

## 2011-09-14 DIAGNOSIS — H1010 Acute atopic conjunctivitis, unspecified eye: Secondary | ICD-10-CM

## 2011-09-14 DIAGNOSIS — R05 Cough: Secondary | ICD-10-CM | POA: Insufficient documentation

## 2011-09-14 DIAGNOSIS — J309 Allergic rhinitis, unspecified: Secondary | ICD-10-CM

## 2011-09-14 DIAGNOSIS — J040 Acute laryngitis: Secondary | ICD-10-CM | POA: Diagnosis not present

## 2011-09-14 DIAGNOSIS — F329 Major depressive disorder, single episode, unspecified: Secondary | ICD-10-CM

## 2011-09-14 DIAGNOSIS — J45901 Unspecified asthma with (acute) exacerbation: Secondary | ICD-10-CM

## 2011-09-14 DIAGNOSIS — J310 Chronic rhinitis: Secondary | ICD-10-CM | POA: Insufficient documentation

## 2011-09-14 DIAGNOSIS — R059 Cough, unspecified: Secondary | ICD-10-CM | POA: Diagnosis not present

## 2011-09-14 MED ORDER — FLUTICASONE PROPIONATE 50 MCG/ACT NA SUSP: 2.0000 | Freq: Every day | NASAL | Status: DC

## 2011-09-14 MED ORDER — TETRAHYDROZOLINE-ZN SULFATE 0.05-0.25 % OP SOLN: 2.0000 [drp] | Freq: Three times a day (TID) | OPHTHALMIC | Status: AC | PRN

## 2011-09-14 MED ORDER — BENZONATATE 100 MG PO CAPS: 100.0000 mg | ORAL_CAPSULE | Freq: Three times a day (TID) | ORAL | Status: DC | PRN

## 2011-09-14 MED ORDER — BUPROPION HCL ER (XL) 150 MG PO TB24: 150.0000 mg | ORAL_TABLET | ORAL | Status: DC

## 2011-09-14 NOTE — Assessment & Plan Note (Addendum)
BP elevated today at 149/95 but that the context of acute illness. Would recheck and evaluate need for antihypertensive medication changes during her next visit.

## 2011-09-14 NOTE — Assessment & Plan Note (Signed)
Viral versus allergic, acute presentation. Advised patient that this may be self resolving, patient to followup if problem persists or worsens.

## 2011-09-14 NOTE — Assessment & Plan Note (Signed)
Allergic versus viral. Visine-A prescription given.

## 2011-09-14 NOTE — Patient Instructions (Addendum)
--  You may have a viral infection. Keep drinking fluids. Call us if you have shortness of breath, fever, more chills, or vomiting.  --Use your eye drops for eye itching and redness. You may stop using if this gets better --Use the nasal spray for the runny nose.

## 2011-09-14 NOTE — Assessment & Plan Note (Signed)
Patient reports last asthma attack 3 months ago. Patient currently with no shortness of breath with no need to use her inhaler.

## 2011-09-14 NOTE — Assessment & Plan Note (Addendum)
URI versus postnasal drip cough. Patient with worsened cough keeping her up at night.  Tessalon Perles prescription given. Pt  advised that this medication may cause drowsiness. Patient instructed to call or go to the ED if she develops fever, shortness of breath, or if her cough worsens

## 2011-09-14 NOTE — Progress Notes (Signed)
  Subjective:    Patient ID: Vanessa Palmer, female    DOB: 02-16-1951, 61 y.o.   MRN: [id number]  HPI  Vanessa Palmer is a pleasant 61 year old woman with past medical history significant for intrinsic asthma, major depressive disorder, and hypertension who comes in today for evaluation of cough productive of a scant white sputum and a runny nose for 2 weeks now. In addition she has had had bilateral eye redness and itching, postnasal drip, and hoarseness. Her cough has progressively worsened and has kept her up all night last night and has caused intermittent nausea. She has not taken any medicines but reports a history of similar symptoms related to allergies that were relieved with allergy medications. She denies shortness of breath and tells me that her asthma is well-controlled; she has not used her inhaler in months. Her last asthma attack was 3 months ago which was alleviated by nebulizer treatment delivered by EMT.  She has no other complaints today.    Review of Systems  Constitutional: Positive for chills. Negative for fever, diaphoresis, activity change, appetite change and fatigue.  HENT: Positive for congestion, rhinorrhea, voice change and postnasal drip. Negative for ear pain, nosebleeds, sore throat and sinus pressure.   Eyes: Positive for discharge, redness and itching. Negative for photophobia, pain and visual disturbance.       Scant watery discharge.   Respiratory: Positive for cough. Negative for chest tightness, shortness of breath, wheezing and stridor.   Cardiovascular: Negative for chest pain, palpitations and leg swelling.  Gastrointestinal: Positive for nausea.       Nausea secondary to cough  Genitourinary: Negative for dysuria.  Neurological: Negative for dizziness, weakness, light-headedness and headaches.  Hematological: Negative for adenopathy.  Psychiatric/Behavioral: Negative for behavioral problems and agitation.       Objective:   Physical Exam    Constitutional: She is oriented to person, place, and time. She appears well-developed and well-nourished. No distress.  HENT:  Head: Normocephalic and atraumatic.  Right Ear: External ear normal.  Left Ear: External ear normal.  Mouth/Throat: Oropharynx is clear and moist. No oropharyngeal exudate.       Bilateral, erythematous, edematous nasal turbinates. Mild peritonsillar erythema likely secondary to cough.  Eyes: Pupils are equal, round, and reactive to light. Right eye exhibits no discharge. Left eye exhibits no discharge.        conjunctivae redness  Neck: Neck supple.  Cardiovascular: Normal rate.        Distant heart sounds secondary to body habitus and  Pulmonary/Chest: Effort normal and breath sounds normal. No respiratory distress. She has no wheezes. She has no rales. She exhibits no tenderness.  Musculoskeletal: She exhibits no edema.  Lymphadenopathy:    She has no cervical adenopathy.  Neurological: She is alert and oriented to person, place, and time.  Skin: Skin is warm and dry. She is not diaphoretic.  Psychiatric: She has a normal mood and affect. Her behavior is normal.          Assessment & Plan:

## 2011-09-14 NOTE — Assessment & Plan Note (Signed)
Her allergic versus viral.  Flonase prescription given.

## 2011-09-14 NOTE — Progress Notes (Signed)
Patient seen and examined with resident physician, I agree with Assessment and Plan. 

## 2011-09-15 ENCOUNTER — Other Ambulatory Visit: Payer: Self-pay | Admitting: Internal Medicine

## 2011-09-17 ENCOUNTER — Telehealth: Payer: Self-pay | Admitting: *Deleted

## 2011-09-17 NOTE — Telephone Encounter (Signed)
Rx from 09/14/11 were not at pharmacy - pt uses [facility] - checked with Dr Garald Braver all 4 Rx were called to [facility]. RA/Bess aware to discard those 4 Rx. Pt aware. Stanton Kidney Caylan Schifano RN 09/17/11 10AM

## 2011-10-03 ENCOUNTER — Emergency Department (HOSPITAL_COMMUNITY): Payer: Medicare Other

## 2011-10-03 ENCOUNTER — Emergency Department (HOSPITAL_COMMUNITY)
Admission: EM | Admit: 2011-10-03 | Discharge: 2011-10-03 | Disposition: A | Discharge status: Home / Self Care | Payer: Medicare Other | Attending: Emergency Medicine | Admitting: Emergency Medicine

## 2011-10-03 DIAGNOSIS — I129 Hypertensive chronic kidney disease with stage 1 through stage 4 chronic kidney disease, or unspecified chronic kidney disease: Secondary | ICD-10-CM | POA: Diagnosis not present

## 2011-10-03 DIAGNOSIS — G4733 Obstructive sleep apnea (adult) (pediatric): Secondary | ICD-10-CM | POA: Insufficient documentation

## 2011-10-03 DIAGNOSIS — E785 Hyperlipidemia, unspecified: Secondary | ICD-10-CM | POA: Diagnosis not present

## 2011-10-03 DIAGNOSIS — I2510 Atherosclerotic heart disease of native coronary artery without angina pectoris: Secondary | ICD-10-CM | POA: Insufficient documentation

## 2011-10-03 DIAGNOSIS — M109 Gout, unspecified: Secondary | ICD-10-CM | POA: Diagnosis not present

## 2011-10-03 DIAGNOSIS — N183 Chronic kidney disease, stage 3 unspecified: Secondary | ICD-10-CM | POA: Insufficient documentation

## 2011-10-03 DIAGNOSIS — R51 Headache: Secondary | ICD-10-CM | POA: Insufficient documentation

## 2011-10-03 DIAGNOSIS — T1490XA Injury, unspecified, initial encounter: Secondary | ICD-10-CM | POA: Diagnosis not present

## 2011-10-03 DIAGNOSIS — M25869 Other specified joint disorders, unspecified knee: Secondary | ICD-10-CM | POA: Diagnosis not present

## 2011-10-03 DIAGNOSIS — W19XXXA Unspecified fall, initial encounter: Secondary | ICD-10-CM

## 2011-10-03 DIAGNOSIS — E669 Obesity, unspecified: Secondary | ICD-10-CM | POA: Insufficient documentation

## 2011-10-03 DIAGNOSIS — M25569 Pain in unspecified knee: Secondary | ICD-10-CM | POA: Diagnosis not present

## 2011-10-03 LAB — CBC WITH DIFFERENTIAL/PLATELET
Basophils Absolute: 0.1 10*3/uL (ref 0.0–0.1)
Basophils Relative: 1 % (ref 0–1)
Eosinophils Absolute: 0.1 10*3/uL (ref 0.0–0.7)
Eosinophils Relative: 2 % (ref 0–5)
HCT: 33.4 % — ABNORMAL LOW (ref 36.0–46.0)
MCH: 30.4 pg (ref 26.0–34.0)
MCHC: 33.5 g/dL (ref 30.0–36.0)
MCV: 90.5 fL (ref 78.0–100.0)
Monocytes Absolute: 0.3 10*3/uL (ref 0.1–1.0)
Neutro Abs: 3.2 10*3/uL (ref 1.7–7.7)
RDW: 14 % (ref 11.5–15.5)

## 2011-10-03 LAB — URINALYSIS, ROUTINE W REFLEX MICROSCOPIC
Bilirubin Urine: NEGATIVE
Ketones, ur: NEGATIVE mg/dL
Leukocytes, UA: NEGATIVE
Nitrite: NEGATIVE
Protein, ur: NEGATIVE mg/dL

## 2011-10-03 LAB — BASIC METABOLIC PANEL
BUN: 19 mg/dL (ref 6–23)
Calcium: 8.7 mg/dL (ref 8.4–10.5)
Creatinine, Ser: 1.34 mg/dL — ABNORMAL HIGH (ref 0.50–1.10)
GFR calc Af Amer: 50 mL/min — ABNORMAL LOW (ref >90)
GFR calc non Af Amer: 43 mL/min — ABNORMAL LOW (ref >90)

## 2011-10-03 MED ORDER — HYDROCODONE-ACETAMINOPHEN 5-325 MG PO TABS: 1.0000 | ORAL_TABLET | Freq: Once | ORAL | Status: DC

## 2011-10-03 NOTE — ED Notes (Signed)
Per ems pt was attempting to walk to BR w/o walker and fell in room. Pt c/o of headache. Pt states she may have hit her head but unsure. Pt also c/o knee pain. But knee pain is chronic issue.

## 2011-10-03 NOTE — ED Provider Notes (Signed)
History     CSN: [id number]  Arrival date & time 10/03/11  0212   First MD Initiated Contact with Patient 10/03/11 [phone]      Chief Complaint  Patient presents with  . Fall  . Knee Pain  . Headache    (Consider location/radiation/quality/duration/timing/severity/associated sxs/prior treatment) HPI Comments: Patient with a history of chronic kidney disease, hypertension, obesity, and CAD presents emergency department status post fall.  Per EMS patient was attempting to walk to the restroom without her walker when she fell.  She's complaining of headache and knee pain, however she is unsure if she hit her head or have loss of consciousness. Pt states she has chronic knee pain and needs a total knee, but feels that the fall has worsened things. She denies any change in vision and states that the fall was mechanical. She states that she takes trazodone and Ambien at night, that she feels groggy and this might have contributed to the fall. She also reports difficulty ambulating at baseline d/t to chronic knee & back pain using a cane, walker, or wheelchair to get around depending how bad the pain is. She denies CP, SOB, claudication, cough, hemoptysis, leg swelling, syncope, weakness, slurred speech, facial droop, fevers, night sweats or chills.   The history is provided by the patient, the EMS personnel and medical records. The history is limited by the condition of the patient.    Past Medical History  Diagnosis Date  . Hyperlipidemia   . Hypertension   . Gout   . Obesity   . Depression   . DJD (degenerative joint disease)   . OSA (obstructive sleep apnea)     previously used CPAP, has lost  . CAD (coronary artery disease)     s/p non-Q wave MI in 06/2001 and 2004, 2005  . ALCOHOL ABUSE 12/13/2005    Annotation: Sober since 11/06 Qualifier: Diagnosis of  By: Wallace Cullens MD, Natalia Leatherwood    . INTRINSIC ASTHMA, WITH EXACERBATION 09/27/2009    Qualifier: Diagnosis of  By: Denton Meek MD, Tillie Rung    .  Chronic kidney disease (CKD), stage III (moderate) 09/19/2010    Past Surgical History  Procedure Date  . Partial hysterectomy     due to endometriosis  . Tubal ligation   . Cardiac catheterization     06/2001, 02/2002, 10/2004 (10-15% proximal stenosis of circumflex, diffuse disease of  OM1 and RCA)    Family History  Problem Relation Age of Onset  . Mental illness Mother   . Heart disease Father   . Diabetes Sister   . Diabetes Sister   . Diabetes Sister     History  Substance Use Topics  . Smoking status: Never Smoker   . Smokeless tobacco: Not on file  . Alcohol Use: No    OB History    Grav Para Term Preterm Abortions TAB SAB Ect Mult Living                  Review of Systems  All other systems reviewed and are negative.    Allergies  Percocet; Ace inhibitors; Hydromorphone hcl; Propoxyphene-acetaminophen; and Vicodin  Home Medications   Current Outpatient Rx  Name Route Sig Dispense Refill  . ASPIRIN 81 MG PO TABS Oral Take 81 mg by mouth daily.      Marland Kitchen BENZONATATE 100 MG PO CAPS Oral Take 1 capsule (100 mg total) by mouth 3 (three) times daily as needed for cough. 30 capsule 1  . BUPROPION HCL ER (  XL) 150 MG PO TB24  TAKE 1 TABLET (150 MG TOTAL) BY MOUTH EVERY MORNING. 30 tablet 0  . CITALOPRAM HYDROBROMIDE 40 MG PO TABS Oral Take 1 tablet (40 mg total) by mouth daily. 30 tablet 3  . FESOTERODINE FUMARATE ER 4 MG PO TB24 Oral Take 4 mg by mouth daily.      Marland Kitchen FLUTICASONE PROPIONATE 50 MCG/ACT NA SUSP Nasal Place 2 sprays into the nose daily. 16 g 2  . FLUTICASONE PROPIONATE  HFA 44 MCG/ACT IN AERO Inhalation Inhale 1 puff into the lungs 2 (two) times daily. 1 Inhaler 6  . LAMOTRIGINE 100 MG PO TABS Oral Take 1 tablet (100 mg total) by mouth daily. 30 tablet 3  . LOSARTAN POTASSIUM 100 MG PO TABS Oral Take 100 mg by mouth 2 (two) times daily.    Marland Kitchen METOPROLOL TARTRATE 50 MG PO TABS Oral Take 2 tablets (100 mg total) by mouth 2 (two) times daily. 60 tablet 11  .  NITROGLYCERIN 0.3 MG SL SUBL Sublingual Place 0.3 mg under the tongue every 5 (five) minutes as needed.      Marland Kitchen OMEPRAZOLE 40 MG PO CPDR  Take one tablet by mouth q 12 hours 60 capsule 11  . ACETAMINOPHEN-CODEINE #3 300-30 MG PO TABS Oral Take 1 tablet by mouth every 6 (six) hours as needed for pain (do not take more than 12 tablets per day - USED FOR PAIN). 45 tablet 0  . ALLOPURINOL 100 MG PO TABS Oral Take 1 tablet (100 mg total) by mouth daily. 30 tablet 11    BP 156/97  Pulse 68  Temp 97.8 F (36.6 C) (Oral)  Resp 18  Ht 5\' 2"  (1.575 m)  Wt 240 lb (108.863 kg)  BMI 43.90 kg/m2  SpO2 97%  Physical Exam  Nursing note and vitals reviewed. Constitutional: She is oriented to person, place, and time. She appears well-developed and well-nourished. No distress.  HENT:  Head: Normocephalic and atraumatic.  Eyes: Conjunctivae and EOM are normal.  Neck: Normal range of motion.  Cardiovascular:       RRR, distant heart sounds, intact distal pulses.   Pulmonary/Chest: Effort normal.       LCAB  Abdominal:       Obese, soft, nontender.   Musculoskeletal: Normal range of motion.       Left knee ttp with painful ROM.   Neurological: She is alert and oriented to person, place, and time.       Pt alert and oriented x 3, but appears groggy. CN III-XII intact. Slowed coordination. Ambulation with assistance.   Skin: Skin is warm and dry. No rash noted. She is not diaphoretic.  Psychiatric: She has a normal mood and affect. Her behavior is normal.    ED Course  Procedures (including critical care time)  Labs Reviewed  CBC WITH DIFFERENTIAL - Abnormal; Notable for the following:    RBC 3.69 (*)     Hemoglobin 11.2 (*)     HCT 33.4 (*)     All other components within normal limits  BASIC METABOLIC PANEL - Abnormal; Notable for the following:    Sodium 133 (*)     Creatinine, Ser 1.34 (*)     GFR calc non Af Amer 43 (*)     GFR calc Af Amer 50 (*)     All other components within normal  limits  URINALYSIS, ROUTINE W REFLEX MICROSCOPIC   Ct Head Wo Contrast  10/03/2011  *RADIOLOGY REPORT*  Clinical Data: Headache after fall.  CT HEAD WITHOUT CONTRAST  Technique:  Contiguous axial images were obtained from the base of the skull through the vertex without contrast.  Comparison: 08/10/2010  Findings: The ventricles and sulci are symmetrical without significant effacement, displacement, or dilatation. No mass effect or midline shift. No abnormal extra-axial fluid collections. The grey-white matter junction is distinct. Basal cisterns are not effaced. No acute intracranial hemorrhage. No depressed skull fractures.  Visualized paranasal sinuses and mastoid air cells are not opacified.  No significant change since previous study.  IMPRESSION: No acute intracranial abnormalities.  Original Report Authenticated By: Marlon Pel, M.D.   Dg Knee Complete 4 Views Left  10/03/2011  *RADIOLOGY REPORT*  Clinical Data: Left knee pain after fall.  LEFT KNEE - COMPLETE 4+ VIEW  Comparison: None.  Findings: Tricompartmental degenerative changes in the left knee with joint space narrowing and hypertrophic changes.  Changes are most prominent in the medial compartment.  No evidence of acute fracture or subluxation.  No focal bone lesion or bone destruction. Bone cortex and trabecular architecture appear intact.  No significant effusion.  IMPRESSION: Degenerative changes in the left knee.  No acute bony abnormalities.  Original Report Authenticated By: Marlon Pel, M.D.     No diagnosis found.    MDM  Mechanical Fall HA Knee pain   Pt with mechanical fall to ER complaining of HA & knee pain. Labs & imaging reviewed with out acute abnormalities. HA non concerning for [facility], ICH, Meningitis, or temporal arteritis. Pt ambulated w assistance and states she is at her baseline. Pt is afebrile with no focal neuro deficits, nuchal rigidity, or change in vision. Pt is to follow up with PCP. Pt  verbalizes understanding and is agreeable with plan to dc. Pt seen with Dr. Read Drivers who agrees with above plan to dc.          Jaci Carrel, [place] 10/03/11 [phone]

## 2011-10-03 NOTE — ED Notes (Signed)
JYN:[id number]<AO> Expected date:10/03/11<BR> Expected time: 1:56 AM<BR> Means of arrival:Ambulance<BR> Comments:<BR> Fall; head injury

## 2011-10-03 NOTE — ED Provider Notes (Signed)
Medical screening examination/treatment/procedure(s) were conducted as a shared visit with non-physician practitioner(s) and myself.  I personally evaluated the patient during the encounter  6:09 AM Able to walk with assistance. Patient normally requires either a cane, walker or wheelchair for mobility at home depending on her level of pain.  Hanley Seamen, MD 10/03/11 [phone]

## 2011-10-04 ENCOUNTER — Encounter (HOSPITAL_COMMUNITY): Payer: Self-pay

## 2011-11-05 ENCOUNTER — Encounter: Payer: Self-pay | Admitting: Internal Medicine

## 2011-11-05 ENCOUNTER — Ambulatory Visit (INDEPENDENT_AMBULATORY_CARE_PROVIDER_SITE_OTHER): Payer: Medicare Other | Admitting: Internal Medicine

## 2011-11-05 VITALS — BP 159/98 | HR 53 | Temp 97.6°F | Ht 62.0 in

## 2011-11-05 DIAGNOSIS — M549 Dorsalgia, unspecified: Secondary | ICD-10-CM | POA: Diagnosis not present

## 2011-11-05 DIAGNOSIS — M2550 Pain in unspecified joint: Secondary | ICD-10-CM

## 2011-11-05 DIAGNOSIS — I10 Essential (primary) hypertension: Secondary | ICD-10-CM

## 2011-11-05 MED ORDER — MORPHINE SULFATE 15 MG PO TABS: 7.5000 mg | ORAL_TABLET | Freq: Four times a day (QID) | ORAL | Status: AC | PRN

## 2011-11-05 MED ORDER — LOSARTAN POTASSIUM 100 MG PO TABS: 100.0000 mg | ORAL_TABLET | Freq: Two times a day (BID) | ORAL | Status: DC

## 2011-11-05 NOTE — Patient Instructions (Signed)
1.  Start Morphine 15 mg tablets.  Take 1/2 to 1 tablet every 6 hours for your pain.  2.  It is okay to use Tylenol with them as well if you want for your pain.  3.  Use an ice pack or a heating pad on your back and knees, which ever helps more.  Place them on there for 20 minutes at a time several times daily  4.  Follow up in 1 month to see how you are doing.   Back Exercises Back exercises help treat and prevent back injuries. The goal is to increase your strength in your belly (abdominal) and back muscles. These exercises can also help with flexibility. Start these exercises when told by your doctor. HOME CARE Back exercises include: Pelvic Tilt.  Lie on your back with your knees bent. Tilt your pelvis until the lower part of your back is against the floor. Hold this position 5 to 10 sec. Repeat this exercise 5 to 10 times.  Knee to Chest.  Pull 1 knee up against your chest and hold for 20 to 30 seconds. Repeat this with the other knee. This may be done with the other leg straight or bent, whichever feels better. Then, pull both knees up against your chest.  Sit-Ups or Curl-Ups.  Bend your knees 90 degrees. Start with tilting your pelvis, and do a partial, slow sit-up. Only lift your upper half 30 to 45 degrees off the floor. Take at least 2 to 3 seonds for each sit-up. Do not do sit-ups with your knees out straight. If partial sit-ups are difficult, simply do the above but with only tightening your belly (abdominal) muscles and holding it as told.  Hip-Lift.  Lie on your back with your knees flexed 90 degrees. Push down with your feet and shoulders as you raise your hips 2 inches off the floor. Hold for 10 seconds, repeat 5 to 10 times.  Back Arches.  Lie on your stomach. Prop yourself up on bent elbows. Slowly press on your hands, causing an arch in your low back. Repeat 3 to 5 times.  Shoulder-Lifts.  Lie face down with arms beside your body. Keep hips and belly pressed to floor  as you slowly lift your head and shoulders off the floor.  Do not overdo your exercises. Be careful in the beginning. Exercises may cause you some mild back discomfort. If the pain lasts for more than 15 minutes, stop the exercises until you see your doctor. Improvement with exercise for back problems is slow.  Document Released: 03/17/2010 Document Revised: 02/01/2011 Document Reviewed: 03/17/2010 [facility] Patient Information 2012 [place], [place].   Knee Exercises EXERCISES RANGE OF MOTION(ROM) AND STRETCHING EXERCISES These exercises may help you when beginning to rehabilitate your injury. Your symptoms may resolve with or without further involvement from your physician, physical therapist or athletic trainer. While completing these exercises, remember:   Restoring tissue flexibility helps normal motion to return to the joints. This allows healthier, less painful movement and activity.   An effective stretch should be held for at least 30 seconds.   A stretch should never be painful. You should only feel a gentle lengthening or release in the stretched tissue.  STRETCH - Knee Extension, Prone  Lie on your stomach on a firm surface, such as a bed or countertop. Place your right / left knee and leg just beyond the edge of the surface. You may wish to place a towel under the far end of your  right / left thigh for comfort.   Relax your leg muscles and allow gravity to straighten your knee. Your clinician may advise you to add an ankle weight if more resistance is helpful for you.   You should feel a stretch in the back of your right / left knee. Hold this position for __________ seconds.  Repeat __________ times. Complete this stretch __________ times per day. * Your physician, physical therapist or athletic trainer may ask you to add ankle weight to enhance your stretch.  RANGE OF MOTION - Knee Flexion, Active  Lie on your back with both knees straight. (If this causes back discomfort, bend  your opposite knee, placing your foot flat on the floor.)   Slowly slide your heel back toward your buttocks until you feel a gentle stretch in the front of your knee or thigh.   Hold for __________ seconds. Slowly slide your heel back to the starting position.  Repeat __________ times. Complete this exercise __________ times per day.  STRETCH - Quadriceps, Prone   Lie on your stomach on a firm surface, such as a bed or padded floor.   Bend your right / left knee and grasp your ankle. If you are unable to reach, your ankle or pant leg, use a belt around your foot to lengthen your reach.   Gently pull your heel toward your buttocks. Your knee should not slide out to the side. You should feel a stretch in the front of your thigh and/or knee.   Hold this position for __________ seconds.  Repeat __________ times. Complete this stretch __________ times per day.  STRETCH - Hamstrings, Supine   Lie on your back. Loop a belt or towel over the ball of your right / left foot.   Straighten your right / left knee and slowly pull on the belt to raise your leg. Do not allow the right / left knee to bend. Keep your opposite leg flat on the floor.   Raise the leg until you feel a gentle stretch behind your right / left knee or thigh. Hold this position for __________ seconds.  Repeat __________ times. Complete this stretch __________ times per day.  STRENGTHENING EXERCISES These exercises may help you when beginning to rehabilitate your injury. They may resolve your symptoms with or without further involvement from your physician, physical therapist or athletic trainer. While completing these exercises, remember:   Muscles can gain both the endurance and the strength needed for everyday activities through controlled exercises.   Complete these exercises as instructed by your physician, physical therapist or athletic trainer. Progress the resistance and repetitions only as guided.   You may experience  muscle soreness or fatigue, but the pain or discomfort you are trying to eliminate should never worsen during these exercises. If this pain does worsen, stop and make certain you are following the directions exactly. If the pain is still present after adjustments, discontinue the exercise until you can discuss the trouble with your clinician.  STRENGTH - Quadriceps, Isometrics  Lie on your back with your right / left leg extended and your opposite knee bent.   Gradually tense the muscles in the front of your right / left thigh. You should see either your knee cap slide up toward your hip or increased dimpling just above the knee. This motion will push the back of the knee down toward the floor/mat/bed on which you are lying.   Hold the muscle as tight as you can without increasing your  pain for __________ seconds.   Relax the muscles slowly and completely in between each repetition.  Repeat __________ times. Complete this exercise __________ times per day.  STRENGTH - Quadriceps, Short Arcs   Lie on your back. Place a __________ inch towel roll under your knee so that the knee slightly bends.   Raise only your lower leg by tightening the muscles in the front of your thigh. Do not allow your thigh to rise.   Hold this position for __________ seconds.  Repeat __________ times. Complete this exercise __________ times per day.  OPTIONAL ANKLE WEIGHTS: Begin with ____________________, but DO NOT exceed ____________________. Increase in 1 pound/0.5 kilogram increments.  STRENGTH - Quadriceps, Straight Leg Raises  Quality counts! Watch for signs that the quadriceps muscle is working to insure you are strengthening the correct muscles and not "cheating" by substituting with healthier muscles.  Lay on your back with your right / left leg extended and your opposite knee bent.   Tense the muscles in the front of your right / left thigh. You should see either your knee cap slide up or increased dimpling  just above the knee. Your thigh may even quiver.   Tighten these muscles even more and raise your leg 4 to 6 inches off the floor. Hold for __________ seconds.   Keeping these muscles tense, lower your leg.   Relax the muscles slowly and completely in between each repetition.  Repeat __________ times. Complete this exercise __________ times per day.  STRENGTH - Hamstring, Curls  Lay on your stomach with your legs extended. (If you lay on a bed, your feet may hang over the edge.)   Tighten the muscles in the back of your thigh to bend your right / left knee up to 90 degrees. Keep your hips flat on the bed/floor.   Hold this position for __________ seconds.   Slowly lower your leg back to the starting position.  Repeat __________ times. Complete this exercise __________ times per day.  OPTIONAL ANKLE WEIGHTS: Begin with ____________________, but DO NOT exceed ____________________. Increase in 1 pound/0.5 kilogram increments.  STRENGTH - Quadriceps, Squats  Stand in a door frame so that your feet and knees are in line with the frame.   Use your hands for balance, not support, on the frame.   Slowly lower your weight, bending at the hips and knees. Keep your lower legs upright so that they are parallel with the door frame. Squat only within the range that does not increase your knee pain. Never let your hips drop below your knees.   Slowly return upright, pushing with your legs, not pulling with your hands.  Repeat __________ times. Complete this exercise __________ times per day.  STRENGTH - Quadriceps, Wall Slides  Follow guidelines for form closely. Increased knee pain often results from poorly placed feet or knees.  Lean against a smooth wall or door and walk your feet out 18-24 inches. Place your feet hip-width apart.   Slowly slide down the wall or door until your knees bend __________ degrees.* Keep your knees over your heels, not your toes, and in line with your hips, not  falling to either side.   Hold for __________ seconds. Stand up to rest for __________ seconds in between each repetition.  Repeat __________ times. Complete this exercise __________ times per day. * Your physician, physical therapist or athletic trainer will alter this angle based on your symptoms and progress. Document Released: 12/27/2004 Document Revised: 02/01/2011 Document Reviewed:  05/27/2008 [employer] Patient Information 2012 [place], [place].

## 2011-11-05 NOTE — Progress Notes (Signed)
Subjective:   Patient ID: GENIENE LIST female   DOB: 1950/08/31 61 y.o.   MRN: [id number]  HPI: [name] is a 61 y.o. woman who presents to clinic today for evaluation of her pain.  See Problem focused Assessment and Plan for full details.    Past Medical History  Diagnosis Date  . Hyperlipidemia   . Hypertension   . Gout   . Obesity   . Depression   . DJD (degenerative joint disease)   . OSA (obstructive sleep apnea)     previously used CPAP, has lost  . CAD (coronary artery disease)     s/p non-Q wave MI in 06/2001 and 2004, 2005  . ALCOHOL ABUSE 12/13/2005    Annotation: Sober since 11/06 Qualifier: Diagnosis of  By: Wallace Cullens MD, Natalia Leatherwood    . INTRINSIC ASTHMA, WITH EXACERBATION 09/27/2009    Qualifier: Diagnosis of  By: Denton Meek MD, Tillie Rung    . Chronic kidney disease (CKD), stage III (moderate) 09/19/2010   Current Outpatient Prescriptions  Medication Sig Dispense Refill  . acetaminophen-codeine (TYLENOL #3) 300-30 MG per tablet Take 1 tablet by mouth every 6 (six) hours as needed for pain (do not take more than 12 tablets per day - USED FOR PAIN).  45 tablet  0  . allopurinol (ZYLOPRIM) 100 MG tablet Take 1 tablet (100 mg total) by mouth daily.  30 tablet  11  . aspirin 81 MG tablet Take 81 mg by mouth daily.        . benzonatate (TESSALON PERLES) 100 MG capsule Take 1 capsule (100 mg total) by mouth 3 (three) times daily as needed for cough.  30 capsule  1  . buPROPion (WELLBUTRIN XL) 150 MG 24 hr tablet TAKE 1 TABLET (150 MG TOTAL) BY MOUTH EVERY MORNING.  30 tablet  0  . citalopram (CELEXA) 40 MG tablet Take 1 tablet (40 mg total) by mouth daily.  30 tablet  3  . fesoterodine (TOVIAZ) 4 MG TB24 Take 4 mg by mouth daily.        . fluticasone (FLONASE) 50 MCG/ACT nasal spray Place 2 sprays into the nose daily.  16 g  2  . fluticasone (FLOVENT HFA) 44 MCG/ACT inhaler Inhale 1 puff into the lungs 2 (two) times daily.  1 Inhaler  6  . lamoTRIgine (LAMICTAL) 100 MG tablet  Take 1 tablet (100 mg total) by mouth daily.  30 tablet  3  . losartan (COZAAR) 100 MG tablet Take 100 mg by mouth 2 (two) times daily.      . metoprolol (LOPRESSOR) 50 MG tablet Take 2 tablets (100 mg total) by mouth 2 (two) times daily.  60 tablet  11  . nitroGLYCERIN (NITROSTAT) 0.3 MG SL tablet Place 0.3 mg under the tongue every 5 (five) minutes as needed.        Marland Kitchen omeprazole (PRILOSEC) 40 MG capsule Take one tablet by mouth q 12 hours  60 capsule  11   Family History  Problem Relation Age of Onset  . Mental illness Mother   . Heart disease Father   . Diabetes Sister   . Diabetes Sister   . Diabetes Sister    History   Social History  . Marital Status: Single    Spouse Name: N/A    Number of Children: 3  . Years of Education: GED   Occupational History  . retired     previously worked as a Hospital doctor   Social History Main Topics  .  Smoking status: Never Smoker   . Smokeless tobacco: None  . Alcohol Use: No  . Drug Use: No  . Sexually Active: None   Other Topics Concern  . None   Social History Narrative   Lives alone here in [place].   Review of Systems: Constitutional: Denies fever, chills, diaphoresis, appetite change and fatigue.  HEENT: Denies photophobia, eye pain, redness, hearing loss, ear pain, congestion, sore throat, rhinorrhea, sneezing, mouth sores, trouble swallowing, neck pain, neck stiffness and tinnitus.   Respiratory: Denies SOB, DOE, cough, chest tightness,  and wheezing.   Cardiovascular: Denies chest pain, palpitations and leg swelling.  Gastrointestinal: Denies nausea, vomiting, abdominal pain, diarrhea, constipation, blood in stool and abdominal distention.  Genitourinary: Denies dysuria, urgency, frequency, hematuria, flank pain and difficulty urinating.  Musculoskeletal: Positive for back pain and arthralgias.  Denies myalgias, joint swelling, and gait problem.  Skin: Denies pallor, rash and wound.  Neurological: Denies dizziness,  seizures, syncope, weakness, light-headedness, numbness and headaches.  Hematological: Denies adenopathy. Easy bruising, personal or family bleeding history  Psychiatric/Behavioral: Denies suicidal ideation, mood changes, confusion, nervousness, sleep disturbance and agitation  Objective:  Physical Exam: Filed Vitals:   11/05/11 0913  BP: 159/98  Pulse: 53  Temp: 97.6 F (36.4 C)  TempSrc: Oral  Height: 5\' 2"  (1.575 m)  SpO2: 96%   Constitutional: Vital signs reviewed.  Patient is a well-developed and well-nourished morbidly obese woman in no acute distress and cooperative with exam. Alert and oriented x3.  Head: Normocephalic and atraumatic Ear: TM normal bilaterally Mouth: no erythema or exudates, MMM Eyes: PERRL, EOMI, conjunctivae normal, No scleral icterus.  Neck: Supple, Trachea midline normal ROM, No JVD, mass, thyromegaly, or carotid bruit present.  Cardiovascular: RRR, S1 normal, S2 normal, no MRG, pulses symmetric and intact bilaterally Pulmonary/Chest: CTAB, no wheezes, rales, or rhonchi Abdominal: Soft. Non-tender, non-distended, bowel sounds are normal, no masses, organomegaly, or guarding present.  GU: no CVA tenderness Musculoskeletal: No joint deformities, erythema, or stiffness.  ROM of the back is limited in all planes.  There is pain to palpation over the lumbar paraspinous muscles.  Hip ROM is limited by body habitus in flexion.  Strength is 5/5 bilaterally.  SLR is negative.  Bilateral knees are tender to palpation over the lateral and medial joint line as well as over the anterior knee cap.  ROM in the knee is limited to 85 degrees of flexion.  There is no hyperextension.  Anterior drawer and posterior drawer is normal bilaterally.  Unable to perform McMurray's secondary to body habitus.  There is mild valgus deformity with standing.  Hematology: no cervical, inginal, or axillary adenopathy.  Neurological: A&O x3, Strength is normal and symmetric bilaterally, cranial  nerve II-XII are grossly intact, no focal motor deficit, sensory intact to light touch bilaterally.  Skin: Warm, dry and intact. No rash, cyanosis, or clubbing.  Psychiatric: Normal mood and affect. speech and behavior is normal. Judgment and thought content normal. Cognition and memory are normal.   Assessment & Plan:

## 2011-11-30 ENCOUNTER — Other Ambulatory Visit: Payer: Self-pay | Admitting: *Deleted

## 2011-11-30 DIAGNOSIS — J309 Allergic rhinitis, unspecified: Secondary | ICD-10-CM

## 2011-12-03 NOTE — Telephone Encounter (Signed)
There is no pharmacy on file.  Do these need to be printed and will she come in to get them?  Or could you please call and find out which pharmacy she needs them sent to?   Thanks

## 2011-12-04 MED ORDER — BUPROPION HCL ER (XL) 150 MG PO TB24: 150.0000 mg | ORAL_TABLET | ORAL | Status: DC

## 2011-12-04 MED ORDER — FLUTICASONE PROPIONATE 50 MCG/ACT NA SUSP: 2.0000 | Freq: Every day | NASAL | Status: DC

## 2011-12-05 ENCOUNTER — Other Ambulatory Visit: Payer: Self-pay | Admitting: *Deleted

## 2011-12-05 DIAGNOSIS — K219 Gastro-esophageal reflux disease without esophagitis: Secondary | ICD-10-CM

## 2011-12-06 MED ORDER — OMEPRAZOLE 40 MG PO CPDR: DELAYED_RELEASE_CAPSULE | ORAL | Status: DC

## 2011-12-30 ENCOUNTER — Inpatient Hospital Stay (HOSPITAL_COMMUNITY)
Admission: EM | Admit: 2011-12-30 | Discharge: 2012-01-01 | DRG: 303 | Disposition: A | Discharge status: Home / Self Care | Payer: Medicare Other | Attending: Internal Medicine | Admitting: Internal Medicine

## 2011-12-30 ENCOUNTER — Encounter (HOSPITAL_COMMUNITY): Payer: Self-pay

## 2011-12-30 ENCOUNTER — Emergency Department (HOSPITAL_COMMUNITY): Payer: Medicare Other

## 2011-12-30 DIAGNOSIS — N183 Chronic kidney disease, stage 3 unspecified: Secondary | ICD-10-CM | POA: Diagnosis present

## 2011-12-30 DIAGNOSIS — F329 Major depressive disorder, single episode, unspecified: Secondary | ICD-10-CM | POA: Diagnosis present

## 2011-12-30 DIAGNOSIS — M549 Dorsalgia, unspecified: Secondary | ICD-10-CM | POA: Diagnosis present

## 2011-12-30 DIAGNOSIS — R32 Unspecified urinary incontinence: Secondary | ICD-10-CM | POA: Diagnosis present

## 2011-12-30 DIAGNOSIS — Z888 Allergy status to other drugs, medicaments and biological substances status: Secondary | ICD-10-CM

## 2011-12-30 DIAGNOSIS — I200 Unstable angina: Secondary | ICD-10-CM | POA: Diagnosis present

## 2011-12-30 DIAGNOSIS — R079 Chest pain, unspecified: Secondary | ICD-10-CM

## 2011-12-30 DIAGNOSIS — J45909 Unspecified asthma, uncomplicated: Secondary | ICD-10-CM | POA: Diagnosis present

## 2011-12-30 DIAGNOSIS — I129 Hypertensive chronic kidney disease with stage 1 through stage 4 chronic kidney disease, or unspecified chronic kidney disease: Secondary | ICD-10-CM | POA: Diagnosis present

## 2011-12-30 DIAGNOSIS — R6889 Other general symptoms and signs: Secondary | ICD-10-CM | POA: Diagnosis not present

## 2011-12-30 DIAGNOSIS — Z8249 Family history of ischemic heart disease and other diseases of the circulatory system: Secondary | ICD-10-CM

## 2011-12-30 DIAGNOSIS — F3289 Other specified depressive episodes: Secondary | ICD-10-CM | POA: Diagnosis present

## 2011-12-30 DIAGNOSIS — R072 Precordial pain: Secondary | ICD-10-CM | POA: Diagnosis not present

## 2011-12-30 DIAGNOSIS — F331 Major depressive disorder, recurrent, moderate: Secondary | ICD-10-CM | POA: Diagnosis present

## 2011-12-30 DIAGNOSIS — E785 Hyperlipidemia, unspecified: Secondary | ICD-10-CM | POA: Diagnosis not present

## 2011-12-30 DIAGNOSIS — Z6841 Body Mass Index (BMI) 40.0 and over, adult: Secondary | ICD-10-CM

## 2011-12-30 DIAGNOSIS — I252 Old myocardial infarction: Secondary | ICD-10-CM

## 2011-12-30 DIAGNOSIS — K219 Gastro-esophageal reflux disease without esophagitis: Secondary | ICD-10-CM | POA: Diagnosis present

## 2011-12-30 DIAGNOSIS — M545 Low back pain: Secondary | ICD-10-CM | POA: Diagnosis not present

## 2011-12-30 DIAGNOSIS — F1010 Alcohol abuse, uncomplicated: Secondary | ICD-10-CM | POA: Diagnosis present

## 2011-12-30 DIAGNOSIS — G8929 Other chronic pain: Secondary | ICD-10-CM | POA: Diagnosis present

## 2011-12-30 DIAGNOSIS — I10 Essential (primary) hypertension: Secondary | ICD-10-CM | POA: Diagnosis not present

## 2011-12-30 DIAGNOSIS — M109 Gout, unspecified: Secondary | ICD-10-CM | POA: Diagnosis present

## 2011-12-30 DIAGNOSIS — G4733 Obstructive sleep apnea (adult) (pediatric): Secondary | ICD-10-CM | POA: Diagnosis present

## 2011-12-30 DIAGNOSIS — F1011 Alcohol abuse, in remission: Secondary | ICD-10-CM | POA: Diagnosis present

## 2011-12-30 DIAGNOSIS — Z833 Family history of diabetes mellitus: Secondary | ICD-10-CM

## 2011-12-30 DIAGNOSIS — I2510 Atherosclerotic heart disease of native coronary artery without angina pectoris: Principal | ICD-10-CM | POA: Diagnosis present

## 2011-12-30 LAB — URINALYSIS, ROUTINE W REFLEX MICROSCOPIC
Glucose, UA: NEGATIVE mg/dL
Ketones, ur: NEGATIVE mg/dL
Leukocytes, UA: NEGATIVE
Specific Gravity, Urine: 1.005 (ref 1.005–1.030)
pH: 6 (ref 5.0–8.0)

## 2011-12-30 LAB — CBC
MCH: 31.2 pg (ref 26.0–34.0)
MCHC: 35.5 g/dL (ref 30.0–36.0)
RDW: 12.5 % (ref 11.5–15.5)

## 2011-12-30 LAB — BASIC METABOLIC PANEL
BUN: 25 mg/dL — ABNORMAL HIGH (ref 6–23)
Calcium: 8.8 mg/dL (ref 8.4–10.5)
GFR calc Af Amer: 45 mL/min — ABNORMAL LOW (ref >90)
GFR calc non Af Amer: 39 mL/min — ABNORMAL LOW (ref >90)
Potassium: 3.6 mEq/L (ref 3.5–5.1)
Sodium: 137 mEq/L (ref 135–145)

## 2011-12-30 LAB — POCT I-STAT TROPONIN I: Troponin i, poc: 0.01 ng/mL (ref 0.00–0.08)

## 2011-12-30 MED ORDER — NITROGLYCERIN 0.4 MG SL SUBL
0.4000 mg | SUBLINGUAL_TABLET | SUBLINGUAL | Status: DC | PRN
  Administered 2011-12-31: 0.4 mg via SUBLINGUAL
  Filled 2011-12-30 (×2): qty 25

## 2011-12-30 MED ORDER — FENTANYL CITRATE 0.05 MG/ML IJ SOLN
50.0000 ug | Freq: Once | INTRAMUSCULAR | Status: AC
  Administered 2011-12-30: 50 ug via INTRAVENOUS
  Filled 2011-12-30: qty 2

## 2011-12-30 MED ORDER — KETOROLAC TROMETHAMINE 30 MG/ML IJ SOLN
30.0000 mg | Freq: Once | INTRAMUSCULAR | Status: AC
  Administered 2011-12-30: 30 mg via INTRAVENOUS
  Filled 2011-12-30: qty 1

## 2011-12-30 MED ORDER — OXYCODONE-ACETAMINOPHEN 5-325 MG PO TABS
2.0000 | ORAL_TABLET | Freq: Once | ORAL | Status: AC
  Administered 2011-12-30: 2 via ORAL
  Filled 2011-12-30: qty 2

## 2011-12-30 MED ORDER — SODIUM CHLORIDE 0.9 % IV SOLN
Freq: Once | INTRAVENOUS | Status: AC
  Administered 2011-12-30: 23:00:00 via INTRAVENOUS

## 2011-12-30 MED ORDER — DIAZEPAM 2 MG PO TABS
2.0000 mg | ORAL_TABLET | Freq: Once | ORAL | Status: AC
  Administered 2011-12-30: 2 mg via ORAL
  Filled 2011-12-30: qty 1

## 2011-12-30 MED ORDER — ASPIRIN 81 MG PO CHEW
162.0000 mg | CHEWABLE_TABLET | Freq: Once | ORAL | Status: AC
  Administered 2011-12-30: 162 mg via ORAL
  Filled 2011-12-30: qty 1

## 2011-12-30 MED ORDER — DIPHENHYDRAMINE HCL 50 MG/ML IJ SOLN
25.0000 mg | Freq: Once | INTRAMUSCULAR | Status: AC
  Administered 2011-12-30: 25 mg via INTRAVENOUS
  Filled 2011-12-30: qty 1

## 2011-12-30 NOTE — ED Notes (Signed)
PT denies any CP . Pt only reports back pain thatis chronic.

## 2011-12-30 NOTE — ED Notes (Signed)
Pt reports (L) side chest pain radiating down (L) arm starting last night, pt reports she had a "couple of cocktails," pt reports she then became upset w/her grandchildren b/c they didn't call her to wish her happy birthday. Pt reports her lower back began hurting worse "than usual." Pt reports hx of lower back pain and "increase stress in her life." Pt reports taking nitro x3 yesterday. sts she drank "1 40 oz and 3 shots of liquor" tonight prior to coming in. Pt's grandchildren called 911

## 2011-12-30 NOTE — ED Notes (Signed)
Per EMS pt reports lower back pain x3 days w/a hx of the same. Pt also reports chest pain yesterday pain subsided after taking nitro x3, pt reports chest pain returned upon arriving to ED. VSS SBP 148 palpated, hr 80 regular. Pt admits to ETOH on board

## 2011-12-30 NOTE — ED Notes (Signed)
I gave the patient a pair of extra large socks. 

## 2011-12-30 NOTE — ED Notes (Signed)
Pt intermittently moaning and reluctant to speak to this writer on assessment, eyes remaining closed during conversation. Pt admits to mid chest pain that feels like intermittent throbbing. Pt w/ resp even and unlabored, eyes closed, pt resting comfortably on bed.

## 2011-12-31 ENCOUNTER — Inpatient Hospital Stay (HOSPITAL_COMMUNITY): Payer: Medicare Other

## 2011-12-31 DIAGNOSIS — E7800 Pure hypercholesterolemia, unspecified: Secondary | ICD-10-CM | POA: Diagnosis not present

## 2011-12-31 DIAGNOSIS — I2510 Atherosclerotic heart disease of native coronary artery without angina pectoris: Secondary | ICD-10-CM | POA: Diagnosis not present

## 2011-12-31 DIAGNOSIS — R32 Unspecified urinary incontinence: Secondary | ICD-10-CM | POA: Diagnosis present

## 2011-12-31 DIAGNOSIS — R079 Chest pain, unspecified: Secondary | ICD-10-CM | POA: Diagnosis not present

## 2011-12-31 DIAGNOSIS — I10 Essential (primary) hypertension: Secondary | ICD-10-CM | POA: Diagnosis not present

## 2011-12-31 DIAGNOSIS — I252 Old myocardial infarction: Secondary | ICD-10-CM | POA: Diagnosis not present

## 2011-12-31 LAB — BASIC METABOLIC PANEL
BUN: 21 mg/dL (ref 6–23)
Calcium: 8.5 mg/dL (ref 8.4–10.5)
Creatinine, Ser: 1.39 mg/dL — ABNORMAL HIGH (ref 0.50–1.10)
GFR calc non Af Amer: 41 mL/min — ABNORMAL LOW (ref >90)
Glucose, Bld: 86 mg/dL (ref 70–99)

## 2011-12-31 LAB — HEPATIC FUNCTION PANEL
ALT: 11 U/L (ref 0–35)
AST: 19 U/L (ref 0–37)
Bilirubin, Direct: <0.1 mg/dL (ref 0.0–0.3)
Total Bilirubin: 0.3 mg/dL (ref 0.3–1.2)

## 2011-12-31 LAB — PROTIME-INR: INR: 1.2 (ref 0.00–1.49)

## 2011-12-31 LAB — RAPID URINE DRUG SCREEN, HOSP PERFORMED
Barbiturates: NOT DETECTED
Benzodiazepines: NOT DETECTED
Cocaine: NOT DETECTED
Tetrahydrocannabinol: NOT DETECTED

## 2011-12-31 LAB — MRSA PCR SCREENING: MRSA by PCR: NEGATIVE

## 2011-12-31 LAB — CBC
Hemoglobin: 12.4 g/dL (ref 12.0–15.0)
MCH: 30.7 pg (ref 26.0–34.0)
MCHC: 34.7 g/dL (ref 30.0–36.0)
Platelets: 189 10*3/uL (ref 150–400)

## 2011-12-31 LAB — HEPARIN LEVEL (UNFRACTIONATED): Heparin Unfractionated: 0.52 IU/mL (ref 0.30–0.70)

## 2011-12-31 MED ORDER — HEPARIN (PORCINE) IN NACL 100-0.45 UNIT/ML-% IJ SOLN
1050.0000 [IU]/h | INTRAMUSCULAR | Status: DC
  Administered 2011-12-31 (×2): 1050 [IU]/h via INTRAVENOUS
  Filled 2011-12-31 (×3): qty 250

## 2011-12-31 MED ORDER — AMLODIPINE BESYLATE 10 MG PO TABS
10.0000 mg | ORAL_TABLET | Freq: Every day | ORAL | Status: DC
  Administered 2012-01-01: 10 mg via ORAL
  Filled 2011-12-31: qty 1

## 2011-12-31 MED ORDER — BUPROPION HCL ER (XL) 150 MG PO TB24
150.0000 mg | ORAL_TABLET | ORAL | Status: DC
  Administered 2011-12-31 – 2012-01-01 (×2): 150 mg via ORAL
  Filled 2011-12-31 (×4): qty 1

## 2011-12-31 MED ORDER — PANTOPRAZOLE SODIUM 40 MG PO TBEC
40.0000 mg | DELAYED_RELEASE_TABLET | Freq: Every day | ORAL | Status: DC
  Administered 2012-01-01: 40 mg via ORAL
  Filled 2011-12-31: qty 1

## 2011-12-31 MED ORDER — LORAZEPAM 1 MG PO TABS: 1.0000 mg | ORAL_TABLET | Freq: Four times a day (QID) | ORAL | Status: DC | PRN

## 2011-12-31 MED ORDER — DIPHENHYDRAMINE HCL 25 MG PO CAPS: 25.0000 mg | ORAL_CAPSULE | Freq: Four times a day (QID) | ORAL | Status: DC | PRN

## 2011-12-31 MED ORDER — HEPARIN BOLUS VIA INFUSION
4000.0000 [IU] | Freq: Once | INTRAVENOUS | Status: AC
  Administered 2011-12-31: 4000 [IU] via INTRAVENOUS

## 2011-12-31 MED ORDER — METOPROLOL TARTRATE 100 MG PO TABS
100.0000 mg | ORAL_TABLET | Freq: Two times a day (BID) | ORAL | Status: DC
  Administered 2011-12-31 (×2): 100 mg via ORAL
  Filled 2011-12-31 (×4): qty 1

## 2011-12-31 MED ORDER — LOSARTAN POTASSIUM 50 MG PO TABS: 100.0000 mg | ORAL_TABLET | Freq: Two times a day (BID) | ORAL | Status: DC

## 2011-12-31 MED ORDER — FESOTERODINE FUMARATE ER 8 MG PO TB24
8.0000 mg | ORAL_TABLET | Freq: Every day | ORAL | Status: DC
  Administered 2011-12-31: 8 mg via ORAL
  Filled 2011-12-31 (×3): qty 1

## 2011-12-31 MED ORDER — ATORVASTATIN CALCIUM 80 MG PO TABS
80.0000 mg | ORAL_TABLET | Freq: Every day | ORAL | Status: DC
  Administered 2011-12-31: 80 mg via ORAL
  Filled 2011-12-31 (×2): qty 1

## 2011-12-31 MED ORDER — ADULT MULTIVITAMIN W/MINERALS CH
1.0000 | ORAL_TABLET | Freq: Every day | ORAL | Status: DC
  Administered 2011-12-31 – 2012-01-01 (×2): 1 via ORAL
  Filled 2011-12-31 (×2): qty 1

## 2011-12-31 MED ORDER — TECHNETIUM TC 99M SESTAMIBI GENERIC - CARDIOLITE
30.0000 | Freq: Once | INTRAVENOUS | Status: AC | PRN
  Administered 2011-12-31: 30 via INTRAVENOUS

## 2011-12-31 MED ORDER — REGADENOSON 0.4 MG/5ML IV SOLN
INTRAVENOUS | Status: AC
  Filled 2011-12-31: qty 5

## 2011-12-31 MED ORDER — AMLODIPINE BESYLATE 5 MG PO TABS: 5.0000 mg | ORAL_TABLET | Freq: Every day | ORAL | Status: DC

## 2011-12-31 MED ORDER — REGADENOSON 0.4 MG/5ML IV SOLN
0.4000 mg | Freq: Once | INTRAVENOUS | Status: AC
  Administered 2011-12-31: 0.4 mg via INTRAVENOUS
  Filled 2011-12-31: qty 5

## 2011-12-31 MED ORDER — LOSARTAN POTASSIUM 50 MG PO TABS
100.0000 mg | ORAL_TABLET | Freq: Two times a day (BID) | ORAL | Status: DC
  Administered 2011-12-31 – 2012-01-01 (×3): 100 mg via ORAL
  Filled 2011-12-31 (×4): qty 2

## 2011-12-31 MED ORDER — VITAMIN B-1 100 MG PO TABS
100.0000 mg | ORAL_TABLET | Freq: Every day | ORAL | Status: DC
  Administered 2011-12-31 – 2012-01-01 (×2): 100 mg via ORAL
  Filled 2011-12-31 (×2): qty 1

## 2011-12-31 MED ORDER — SODIUM CHLORIDE 0.9 % IV SOLN: INTRAVENOUS | Status: DC

## 2011-12-31 MED ORDER — ASPIRIN EC 81 MG PO TBEC
81.0000 mg | DELAYED_RELEASE_TABLET | Freq: Every day | ORAL | Status: DC
  Administered 2011-12-31 – 2012-01-01 (×2): 81 mg via ORAL
  Filled 2011-12-31 (×2): qty 1

## 2011-12-31 MED ORDER — POTASSIUM CHLORIDE CRYS ER 20 MEQ PO TBCR
40.0000 meq | EXTENDED_RELEASE_TABLET | Freq: Once | ORAL | Status: AC
  Administered 2011-12-31: 40 meq via ORAL
  Filled 2011-12-31: qty 2

## 2011-12-31 MED ORDER — THIAMINE HCL 100 MG/ML IJ SOLN
100.0000 mg | Freq: Every day | INTRAMUSCULAR | Status: DC
  Filled 2011-12-31: qty 1

## 2011-12-31 MED ORDER — TRAMADOL HCL 50 MG PO TABS
50.0000 mg | ORAL_TABLET | Freq: Four times a day (QID) | ORAL | Status: DC | PRN
  Administered 2011-12-31 – 2012-01-01 (×3): 50 mg via ORAL
  Filled 2011-12-31 (×3): qty 1

## 2011-12-31 MED ORDER — FOLIC ACID 1 MG PO TABS
1.0000 mg | ORAL_TABLET | Freq: Every day | ORAL | Status: DC
  Administered 2011-12-31 – 2012-01-01 (×2): 1 mg via ORAL
  Filled 2011-12-31 (×2): qty 1

## 2011-12-31 MED ORDER — LORAZEPAM 2 MG/ML IJ SOLN: 1.0000 mg | Freq: Four times a day (QID) | INTRAMUSCULAR | Status: DC | PRN

## 2011-12-31 MED ORDER — ONDANSETRON HCL 4 MG/2ML IJ SOLN
4.0000 mg | Freq: Four times a day (QID) | INTRAMUSCULAR | Status: DC | PRN
  Administered 2011-12-31: 4 mg via INTRAVENOUS
  Filled 2011-12-31: qty 2

## 2011-12-31 NOTE — Consult Note (Signed)
Reason for Consult: Recurrent chest pain Referring Physician: Teaching service  Vanessa Palmer is an 61 y.o. female.  HPI: Patient is 61 year old female with past medical history significant for coronary artery disease history of small non-Q-wave myocardial infarction in the past small vessel disease not suitable for PCI in the past hypertension, hypercholesteremia, obesity hypoventilation syndrome/obstructive sleep apnea, morbid obesity, history of gouty arthritis, GERD, depression, EtOH abuse, chronic kidney disease stage II, was admitted earlier today because of recurrent retrosternal and precordial chest pain radiating to left arm grade 7/10 sharp occasionally pain increases with movement also pain relieved with sublingual nitroglycerin. Patient states her chest pain got worst early this morning so decided to call EMS received sublingual nitroglycerin with relief of chest pain. Patient presently denies any chest pain. Denies history of exertional chest pain although activity is limited but does give history of exertional dyspnea with minimal exertion. Denies any PND orthopnea leg swelling denies palpitation lightheadedness or syncope denies any recent cardiac workup.  Past Medical History  Diagnosis Date  . Hyperlipidemia   . Hypertension   . Gout   . Obesity   . Depression   . DJD (degenerative joint disease)   . OSA (obstructive sleep apnea)     previously used CPAP, has lost  . CAD (coronary artery disease)     s/p non-Q wave MI in 06/2001 and 2004, 2005  . ALCOHOL ABUSE 12/13/2005    Annotation: Sober since 11/06 Qualifier: Diagnosis of  By: Wallace Cullens MD, Natalia Leatherwood    . INTRINSIC ASTHMA, WITH EXACERBATION 09/27/2009    Qualifier: Diagnosis of  By: Denton Meek MD, Tillie Rung    . Chronic kidney disease (CKD), stage III (moderate) 09/19/2010    Past Surgical History  Procedure Date  . Partial hysterectomy     due to endometriosis  . Tubal ligation   . Cardiac catheterization     06/2001, 02/2002,  10/2004 (10-15% proximal stenosis of circumflex, diffuse disease of  OM1 and RCA)    Family History  Problem Relation Age of Onset  . Mental illness Mother   . Heart disease Father   . Diabetes Sister   . Diabetes Sister   . Diabetes Sister     Social History:  reports that she has never smoked. She does not have any smokeless tobacco history on file. She reports that she does not drink alcohol or use illicit drugs.  Allergies:  Allergies  Allergen Reactions  . Percocet (Oxycodone-Acetaminophen) Itching  . Ace Inhibitors Other (See Comments)    REACTION: Angioedema  . Hydromorphone Hcl     REACTION: diffuse rash  . Propoxyphene-Acetaminophen Itching    REACTION: itching  . Vicodin (Hydrocodone-Acetaminophen) Itching    Medications: I have reviewed the patient's current medications.  Results for orders placed during the hospital encounter of 12/30/11 (from the past 48 hour(s))  CBC     Status: Normal   Collection Time   12/30/11  9:50 PM      Component Value Range Comment   WBC 5.6  4.0 - 10.5 K/uL    RBC 4.10  3.87 - 5.11 MIL/uL    Hemoglobin 12.8  12.0 - 15.0 g/dL    HCT 46.9  62.9 - 52.8 %    MCV 88.0  78.0 - 100.0 fL    MCH 31.2  26.0 - 34.0 pg    MCHC 35.5  30.0 - 36.0 g/dL    RDW 41.3  24.4 - 01.0 %    Platelets 199  150 -  400 K/uL   BASIC METABOLIC PANEL     Status: Abnormal   Collection Time   12/30/11  9:50 PM      Component Value Range Comment   Sodium 137  135 - 145 mEq/L    Potassium 3.6  3.5 - 5.1 mEq/L    Chloride 106  96 - 112 mEq/L    CO2 22  19 - 32 mEq/L    Glucose, Bld 100 (*) 70 - 99 mg/dL    BUN 25 (*) 6 - 23 mg/dL    Creatinine, Ser 1.61 (*) 0.50 - 1.10 mg/dL    Calcium 8.8  8.4 - 09.6 mg/dL    GFR calc non Af Amer 39 (*) >90 mL/min    GFR calc Af Amer 45 (*) >90 mL/min   URINALYSIS, ROUTINE W REFLEX MICROSCOPIC     Status: Abnormal   Collection Time   12/30/11 10:21 PM      Component Value Range Comment   Color, Urine STRAW (*) YELLOW     APPearance CLEAR  CLEAR    Specific Gravity, Urine 1.005  1.005 - 1.030    pH 6.0  5.0 - 8.0    Glucose, UA NEGATIVE  NEGATIVE mg/dL    Hgb urine dipstick NEGATIVE  NEGATIVE    Bilirubin Urine NEGATIVE  NEGATIVE    Ketones, ur NEGATIVE  NEGATIVE mg/dL    Protein, ur NEGATIVE  NEGATIVE mg/dL    Urobilinogen, UA 0.2  0.0 - 1.0 mg/dL    Nitrite NEGATIVE  NEGATIVE    Leukocytes, UA NEGATIVE  NEGATIVE MICROSCOPIC NOT DONE ON URINES WITH NEGATIVE PROTEIN, BLOOD, LEUKOCYTES, NITRITE, OR GLUCOSE <1000 mg/dL.  URINE RAPID DRUG SCREEN (HOSP PERFORMED)     Status: Normal   Collection Time   12/30/11 10:21 PM      Component Value Range Comment   Opiates NONE DETECTED  NONE DETECTED    Cocaine NONE DETECTED  NONE DETECTED    Benzodiazepines NONE DETECTED  NONE DETECTED    Amphetamines NONE DETECTED  NONE DETECTED    Tetrahydrocannabinol NONE DETECTED  NONE DETECTED    Barbiturates NONE DETECTED  NONE DETECTED   POCT I-STAT TROPONIN I     Status: Normal   Collection Time   12/30/11 10:28 PM      Component Value Range Comment   Troponin i, poc 0.01  0.00 - 0.08 ng/mL    Comment 3            TROPONIN I     Status: Normal   Collection Time   12/30/11 10:53 PM      Component Value Range Comment   Troponin I <0.30  <0.30 ng/mL   ETHANOL     Status: Abnormal   Collection Time   12/30/11 10:54 PM      Component Value Range Comment   Alcohol, Ethyl (B) 192 (*) 0 - 11 mg/dL   TROPONIN I     Status: Normal   Collection Time   12/31/11 12:16 AM      Component Value Range Comment   Troponin I <0.30  <0.30 ng/mL   HEPATIC FUNCTION PANEL     Status: Abnormal   Collection Time   12/31/11  2:06 AM      Component Value Range Comment   Total Protein 7.5  6.0 - 8.3 g/dL    Albumin 3.4 (*) 3.5 - 5.2 g/dL    AST 19  0 - 37 U/L    ALT  11  0 - 35 U/L    Alkaline Phosphatase 148 (*) 39 - 117 U/L    Total Bilirubin 0.3  0.3 - 1.2 mg/dL    Bilirubin, Direct <1.6  0.0 - 0.3 mg/dL    Indirect Bilirubin NOT  CALCULATED  0.3 - 0.9 mg/dL   TSH     Status: Normal   Collection Time   12/31/11  2:06 AM      Component Value Range Comment   TSH 1.207  0.350 - 4.500 uIU/mL   MAGNESIUM     Status: Normal   Collection Time   12/31/11  2:06 AM      Component Value Range Comment   Magnesium 1.7  1.5 - 2.5 mg/dL   URIC ACID     Status: Abnormal   Collection Time   12/31/11  2:06 AM      Component Value Range Comment   Uric Acid, Serum 8.3 (*) 2.4 - 7.0 mg/dL   MRSA PCR SCREENING     Status: Normal   Collection Time   12/31/11  4:12 AM      Component Value Range Comment   MRSA by PCR NEGATIVE  NEGATIVE   HEPARIN LEVEL (UNFRACTIONATED)     Status: Normal   Collection Time   12/31/11  8:39 AM      Component Value Range Comment   Heparin Unfractionated 0.53  0.30 - 0.70 IU/mL   BASIC METABOLIC PANEL     Status: Abnormal   Collection Time   12/31/11  8:39 AM      Component Value Range Comment   Sodium 141  135 - 145 mEq/L    Potassium 4.4  3.5 - 5.1 mEq/L NO VISIBLE HEMOLYSIS   Chloride 108  96 - 112 mEq/L    CO2 25  19 - 32 mEq/L    Glucose, Bld 86  70 - 99 mg/dL    BUN 21  6 - 23 mg/dL    Creatinine, Ser 1.09 (*) 0.50 - 1.10 mg/dL    Calcium 8.5  8.4 - 60.4 mg/dL    GFR calc non Af Amer 41 (*) >90 mL/min    GFR calc Af Amer 47 (*) >90 mL/min   CBC     Status: Abnormal   Collection Time   12/31/11  8:39 AM      Component Value Range Comment   WBC 4.4  4.0 - 10.5 K/uL    RBC 4.04  3.87 - 5.11 MIL/uL    Hemoglobin 12.4  12.0 - 15.0 g/dL    HCT 54.0 (*) 98.1 - 46.0 %    MCV 88.4  78.0 - 100.0 fL    MCH 30.7  26.0 - 34.0 pg    MCHC 34.7  30.0 - 36.0 g/dL    RDW 19.1  47.8 - 29.5 %    Platelets 189  150 - 400 K/uL   PROTIME-INR     Status: Normal   Collection Time   12/31/11  8:39 AM      Component Value Range Comment   Prothrombin Time 15.0  11.6 - 15.2 seconds    INR 1.20  0.00 - 1.49   APTT     Status: Abnormal   Collection Time   12/31/11  8:39 AM      Component Value Range Comment    aPTT 116 (*) 24 - 37 seconds   TROPONIN I     Status: Normal   Collection Time   12/31/11  8:39 AM      Component Value Range Comment   Troponin I <0.30  <0.30 ng/mL     Dg Chest Port 1 View  12/30/2011  *RADIOLOGY REPORT*  Clinical Data: 3-day history of chest pain.  PORTABLE CHEST - 1 VIEW 12/30/2011 2228 hours:  Comparison: Two-view chest x-ray 12/09/2010, 08/10/2010, 09/29/2009.  Findings: Suboptimal inspiration due to body habitus which accounts for crowded bronchovascular markings at the bases and accentuates the cardiac silhouette.  Taking this into account, cardiac silhouette upper normal in size to slightly enlarged but stable. Lungs clear.  No visible pleural effusions.  IMPRESSION: Suboptimal inspiration.  No acute cardiopulmonary disease.  Stable borderline to mild cardiomegaly.   Original Report Authenticated By: Hulan Saas, M.D.     Review of Systems  Constitutional: Negative for fever and chills.  Eyes: Negative for blurred vision and double vision.  Respiratory: Negative for hemoptysis, sputum production and shortness of breath.   Cardiovascular: Positive for chest pain. Negative for palpitations, orthopnea, claudication and leg swelling.  Gastrointestinal: Negative for nausea, vomiting, abdominal pain and diarrhea.  Musculoskeletal: Positive for back pain.  Skin: Negative for rash.  Neurological: Negative for dizziness and headaches.   Blood pressure 178/95, pulse 67, temperature 98.5 F (36.9 C), temperature source Oral, resp. rate 15, height 5\' 2"  (1.575 m), weight 114.6 kg (252 lb 10.4 oz), SpO2 97.00%. Physical Exam  Constitutional: She is oriented to person, place, and time. She appears well-nourished. No distress.  HENT:  Head: Normocephalic and atraumatic.  Mouth/Throat: No oropharyngeal exudate.  Eyes: Conjunctivae normal and EOM are normal. Pupils are equal, round, and reactive to light. No scleral icterus.  Neck: Neck supple. No JVD present. No tracheal  deviation present. No thyromegaly present.  Cardiovascular: Normal rate and regular rhythm.   Murmur (Soft systolic murmur noted no S3 gallop) heard. Respiratory: Effort normal and breath sounds normal. No respiratory distress. She has no wheezes. She has no rales. She exhibits no tenderness.  GI: Soft. Bowel sounds are normal. She exhibits no distension. There is no tenderness. There is no rebound and no guarding.  Musculoskeletal: She exhibits no edema and no tenderness.  Lymphadenopathy:    She has no cervical adenopathy.  Neurological: She is alert and oriented to person, place, and time.    Assessment/Plan: Recurrent chest pain worrisome for angina rule out MI although some features atypical for angina. Coronary artery disease/history of small non-Q-wave myocardial infarction in the past Hypertension Hypercholesteremia Obstructive sleep apnea/obesity hypoventilation syndrome Morbid obesity Gouty arthritis Degenerative joint disease GERD Chronic kidney disease stage II Depression History of EtOH abuse Plan Agree with present management Will schedule for nuclear stress test Discussed with patient regarding lifestyle modification.  Brynlie Daza N 12/31/2011, 11:28 AM

## 2011-12-31 NOTE — H&P (Signed)
Internal Medicine Teaching Service Attending Note Date: 12/31/2011  Patient name: Vanessa Palmer  Medical record number: [id number]  Date of birth: 1950-07-05   I have seen and evaluated Vanessa Palmer and discussed their care with the Residency Team.    Vanessa Palmer is a 61yo woman who presented to the [facility] ED complaining of chest pain. She has a history of NSTEMI's in the past and CAD.  She reports that while getting together to celebrate her birthday, she began having sharp substernal chest pain, 7/10 which radiated with a tingling type sensation down her left arm.  She was resting at the time this started.  She reports occasional nausea and SOB associated with the pain.  She has had similar symptoms in the past, specifically three episodes this past month, each relieved with NTG.  Further symptoms include SOB when walking which has worsened to the point that even taking the elevator causes SOB.  She further reports back pain for which she uses a cane and occasionally a wheelchair.   When seen, her chest pain was resolved with NTG and she was having a mild headache from this NTG.  She specifically denied current SOB, HA, fever, chills, abdominal pain, LE swelling, palpitations.    She has a cardiologist, Dr. Sharyn Lull who has directed her previous work up.   For further PMH, PSH, meds, allergies, please see resident note.   Physical Exam: Blood pressure 184/104, pulse 62, temperature 98.3 F (36.8 C), temperature source Oral, resp. rate 18, height 5\' 2"  (1.575 m), weight 252 lb 10.4 oz (114.6 kg), SpO2 94.00%. General appearance: alert, cooperative and no distress Head: Normocephalic, without obvious abnormality, atraumatic Eyes: anicteric sclerae, EOMI. Lungs: clear to auscultation bilaterally and no wheeze Heart: RR, NR, 2/6 systolic murmur noted Abdomen: soft, NT, ND, +BS Extremities: 1+ edema to knees, dry skin Pulses: 2+ and symmetric  Lab results: Results for orders placed during  the hospital encounter of 12/30/11 (from the past 24 hour(s))  CBC     Status: Normal   Collection Time   12/30/11  9:50 PM      Component Value Range   WBC 5.6  4.0 - 10.5 K/uL   RBC 4.10  3.87 - 5.11 MIL/uL   Hemoglobin 12.8  12.0 - 15.0 g/dL   HCT 40.9  81.1 - 91.4 %   MCV 88.0  78.0 - 100.0 fL   MCH 31.2  26.0 - 34.0 pg   MCHC 35.5  30.0 - 36.0 g/dL   RDW 78.2  95.6 - 21.3 %   Platelets 199  150 - 400 K/uL  BASIC METABOLIC PANEL     Status: Abnormal   Collection Time   12/30/11  9:50 PM      Component Value Range   Sodium 137  135 - 145 mEq/L   Potassium 3.6  3.5 - 5.1 mEq/L   Chloride 106  96 - 112 mEq/L   CO2 22  19 - 32 mEq/L   Glucose, Bld 100 (*) 70 - 99 mg/dL   BUN 25 (*) 6 - 23 mg/dL   Creatinine, Ser 0.86 (*) 0.50 - 1.10 mg/dL   Calcium 8.8  8.4 - 57.8 mg/dL   GFR calc non Af Amer 39 (*) >90 mL/min   GFR calc Af Amer 45 (*) >90 mL/min  URINALYSIS, ROUTINE W REFLEX MICROSCOPIC     Status: Abnormal   Collection Time   12/30/11 10:21 PM      Component Value Range  Color, Urine STRAW (*) YELLOW   APPearance CLEAR  CLEAR   Specific Gravity, Urine 1.005  1.005 - 1.030   pH 6.0  5.0 - 8.0   Glucose, UA NEGATIVE  NEGATIVE mg/dL   Hgb urine dipstick NEGATIVE  NEGATIVE   Bilirubin Urine NEGATIVE  NEGATIVE   Ketones, ur NEGATIVE  NEGATIVE mg/dL   Protein, ur NEGATIVE  NEGATIVE mg/dL   Urobilinogen, UA 0.2  0.0 - 1.0 mg/dL   Nitrite NEGATIVE  NEGATIVE   Leukocytes, UA NEGATIVE  NEGATIVE  URINE RAPID DRUG SCREEN (HOSP PERFORMED)     Status: Normal   Collection Time   12/30/11 10:21 PM      Component Value Range   Opiates NONE DETECTED  NONE DETECTED   Cocaine NONE DETECTED  NONE DETECTED   Benzodiazepines NONE DETECTED  NONE DETECTED   Amphetamines NONE DETECTED  NONE DETECTED   Tetrahydrocannabinol NONE DETECTED  NONE DETECTED   Barbiturates NONE DETECTED  NONE DETECTED  POCT I-STAT TROPONIN I     Status: Normal   Collection Time   12/30/11 10:28 PM      Component  Value Range   Troponin i, poc 0.01  0.00 - 0.08 ng/mL   Comment 3           TROPONIN I     Status: Normal   Collection Time   12/30/11 10:53 PM      Component Value Range   Troponin I <0.30  <0.30 ng/mL  ETHANOL     Status: Abnormal   Collection Time   12/30/11 10:54 PM      Component Value Range   Alcohol, Ethyl (B) 192 (*) 0 - 11 mg/dL  TROPONIN I     Status: Normal   Collection Time   12/31/11 12:16 AM      Component Value Range   Troponin I <0.30  <0.30 ng/mL  HEPATIC FUNCTION PANEL     Status: Abnormal   Collection Time   12/31/11  2:06 AM      Component Value Range   Total Protein 7.5  6.0 - 8.3 g/dL   Albumin 3.4 (*) 3.5 - 5.2 g/dL   AST 19  0 - 37 U/L   ALT 11  0 - 35 U/L   Alkaline Phosphatase 148 (*) 39 - 117 U/L   Total Bilirubin 0.3  0.3 - 1.2 mg/dL   Bilirubin, Direct <7.8  0.0 - 0.3 mg/dL   Indirect Bilirubin NOT CALCULATED  0.3 - 0.9 mg/dL  TSH     Status: Normal   Collection Time   12/31/11  2:06 AM      Component Value Range   TSH 1.207  0.350 - 4.500 uIU/mL  HEMOGLOBIN A1C     Status: Abnormal   Collection Time   12/31/11  2:06 AM      Component Value Range   Hemoglobin A1C 5.8 (*) <5.7 %   Mean Plasma Glucose 120 (*) <117 mg/dL  MAGNESIUM     Status: Normal   Collection Time   12/31/11  2:06 AM      Component Value Range   Magnesium 1.7  1.5 - 2.5 mg/dL  URIC ACID     Status: Abnormal   Collection Time   12/31/11  2:06 AM      Component Value Range   Uric Acid, Serum 8.3 (*) 2.4 - 7.0 mg/dL  MRSA PCR SCREENING     Status: Normal   Collection Time  12/31/11  4:12 AM      Component Value Range   MRSA by PCR NEGATIVE  NEGATIVE  HEPARIN LEVEL (UNFRACTIONATED)     Status: Normal   Collection Time   12/31/11  8:39 AM      Component Value Range   Heparin Unfractionated 0.53  0.30 - 0.70 IU/mL  BASIC METABOLIC PANEL     Status: Abnormal   Collection Time   12/31/11  8:39 AM      Component Value Range   Sodium 141  135 - 145 mEq/L   Potassium 4.4  3.5  - 5.1 mEq/L   Chloride 108  96 - 112 mEq/L   CO2 25  19 - 32 mEq/L   Glucose, Bld 86  70 - 99 mg/dL   BUN 21  6 - 23 mg/dL   Creatinine, Ser 7.84 (*) 0.50 - 1.10 mg/dL   Calcium 8.5  8.4 - 69.6 mg/dL   GFR calc non Af Amer 41 (*) >90 mL/min   GFR calc Af Amer 47 (*) >90 mL/min  CBC     Status: Abnormal   Collection Time   12/31/11  8:39 AM      Component Value Range   WBC 4.4  4.0 - 10.5 K/uL   RBC 4.04  3.87 - 5.11 MIL/uL   Hemoglobin 12.4  12.0 - 15.0 g/dL   HCT 29.5 (*) 28.4 - 13.2 %   MCV 88.4  78.0 - 100.0 fL   MCH 30.7  26.0 - 34.0 pg   MCHC 34.7  30.0 - 36.0 g/dL   RDW 44.0  10.2 - 72.5 %   Platelets 189  150 - 400 K/uL  PROTIME-INR     Status: Normal   Collection Time   12/31/11  8:39 AM      Component Value Range   Prothrombin Time 15.0  11.6 - 15.2 seconds   INR 1.20  0.00 - 1.49  APTT     Status: Abnormal   Collection Time   12/31/11  8:39 AM      Component Value Range   aPTT 116 (*) 24 - 37 seconds  TROPONIN I     Status: Normal   Collection Time   12/31/11  8:39 AM      Component Value Range   Troponin I <0.30  <0.30 ng/mL  HEPARIN LEVEL (UNFRACTIONATED)     Status: Normal   Collection Time   12/31/11  3:42 PM      Component Value Range   Heparin Unfractionated 0.52  0.30 - 0.70 IU/mL    Imaging results:  Dg Chest Port 1 View  12/30/2011  *RADIOLOGY REPORT*  Clinical Data: 3-day history of chest pain.  PORTABLE CHEST - 1 VIEW 12/30/2011 2228 hours:  Comparison: Two-view chest x-ray 12/09/2010, 08/10/2010, 09/29/2009.  Findings: Suboptimal inspiration due to body habitus which accounts for crowded bronchovascular markings at the bases and accentuates the cardiac silhouette.  Taking this into account, cardiac silhouette upper normal in size to slightly enlarged but stable. Lungs clear.  No visible pleural effusions.  IMPRESSION: Suboptimal inspiration.  No acute cardiopulmonary disease.  Stable borderline to mild cardiomegaly.   Original Report Authenticated By:  Hulan Saas, M.D.     Assessment and Plan: I agree with the formulated Assessment and Plan with the following changes:   1. Unstable Angina - Symptoms are concerning for UA, as her CE are negative and no changes noted on EKG from previous - Consult Dr. Sharyn Lull, Cardiology -  Heparin drip, CE X 3, repeat EKG - Statin, Asa, coreg, arb (no ace-I due to allergy) - NPO for possible study - Consider TTE  Agree with further plans for ETOH abuse, HTN, CKD and HLD as per resident note.   Inez Catalina, MD 11/4/20134:33 PM

## 2011-12-31 NOTE — Progress Notes (Signed)
  Echocardiogram 2D Echocardiogram has been performed.  Georgian Co 12/31/2011, 12:14 PM

## 2011-12-31 NOTE — H&P (Signed)
Hospital Admission Note Date: 12/31/2011  Patient name: Vanessa Palmer Medical record number: [id number] Date of birth: [birth year] Age: 61 y.o. Gender: female PCP: [name], MD  Medical Service: Internal Medicine Teaching Service--[place]  Attending physician: Dr. Criselda Peaches    1st Contact: Dr. Carmelina Noun 2nd Contact: Dr. Clyde Lundborg    Pager:[phone] After 5 pm or weekends: 1st Contact:      Pager: [phone] 2nd Contact:      Pager: [phone]  Chief Complaint: Chest pain  History of Present Illness:Vanessa Palmer is a 61 year old African American female with PMH of HTN, gout, obesity, hyperlipidemia, gout, DJD, OSA, CAD s/p non Q wave MIx2, CKD Stage III, chronic alcohol abuse, and asthma presenting to the ED today after her grand-daughter called EMS for complaints of chest pain.  Vanessa Palmer claims she was at her birthday get together today when she started experiencing another episode of sharp substernal chest pain, 7/10, radiating down left arm.  She claims she has had three similar episodes of chest pain this month, each time relieved with nitroglycerin.  The most recent episode was two days prior for which she took 3 nitroglycerin tablets and her chest pain resolved.  She also endorses chronic lower back pain and shortness of breath upon walking short distances.  She already walks with a cane and occasionally uses a wheelchair secondary to knee pain and weakness, but says now even going to the elevator makes her short of breath.  Currently, her chest pain has resolved and she denies any shortness of breath, headaches, fever, chills, N/V/D, abdominal pain, leg swelling, or dysuria at this time.  She does admit to having 2 beers and 1 shot of liquor today.    Of note, Vanessa Palmer claims her cardiologist is Dr. Sharyn Lull who she planned to call due to the increasing episodes of chest pain, however, today the pain was severe that she came to the hospital.  She was she was last seen in [facility] July 2013  for a productive cough and runny nose.    Meds: Current Outpatient Rx  Name  Route  Sig  Dispense  Refill  . ASPIRIN EC 81 MG PO TBEC   Oral   Take 81 mg by mouth daily.         Marland Kitchen BENZONATATE 100 MG PO CAPS   Oral   Take 100 mg by mouth 3 (three) times daily as needed. Cough         . BUPROPION HCL ER (XL) 150 MG PO TB24   Oral   Take 1 tablet (150 mg total) by mouth every morning.   90 tablet   0   . FLUTICASONE PROPIONATE 50 MCG/ACT NA SUSP   Nasal   Place 2 sprays into the nose daily.   16 g   6   . LOSARTAN POTASSIUM 100 MG PO TABS   Oral   Take 1 tablet (100 mg total) by mouth 2 (two) times daily.         Marland Kitchen METOPROLOL TARTRATE 50 MG PO TABS   Oral   Take 100 mg by mouth 2 (two) times daily.         Marland Kitchen NITROGLYCERIN 0.3 MG SL SUBL   Sublingual   Place 0.3 mg under the tongue every 5 (five) minutes as needed. For chest pain         . OMEPRAZOLE 40 MG PO CPDR   Oral   Take 40 mg by mouth 2 (  two) times daily.         . TOVIAZ 8 MG PO TB24   Oral   Take 8 mg by mouth Daily.          Allergies: Allergies as of 12/30/2011 - Review Complete 12/30/2011  Allergen Reaction Noted  . Percocet (oxycodone-acetaminophen) Itching 03/21/2011  . Ace inhibitors Other (See Comments)   . Hydromorphone hcl    . Propoxyphene-acetaminophen Itching   . Vicodin (hydrocodone-acetaminophen) Itching 03/21/2011   Past Medical History  Diagnosis Date  . Hyperlipidemia   . Hypertension   . Gout   . Obesity   . Depression   . DJD (degenerative joint disease)   . OSA (obstructive sleep apnea)     previously used CPAP, has lost  . CAD (coronary artery disease)     s/p non-Q wave MI in 06/2001 and 2004, 2005  . ALCOHOL ABUSE 12/13/2005    Annotation: Sober since 11/06 Qualifier: Diagnosis of  By: Wallace Cullens MD, Natalia Leatherwood    . INTRINSIC ASTHMA, WITH EXACERBATION 09/27/2009    Qualifier: Diagnosis of  By: Denton Meek MD, Tillie Rung    . Chronic kidney disease (CKD), stage III  (moderate) 09/19/2010   Past Surgical History  Procedure Date  . Partial hysterectomy     due to endometriosis  . Tubal ligation   . Cardiac catheterization     06/2001, 02/2002, 10/2004 (10-15% proximal stenosis of circumflex, diffuse disease of  OM1 and RCA)   Family History  Problem Relation Age of Onset  . Mental illness Mother   . Heart disease Father   . Diabetes Sister   . Diabetes Sister   . Diabetes Sister    History   Social History  . Marital Status: Single    Spouse Name: N/A    Number of Children: 3  . Years of Education: GED   Occupational History  . retired     previously worked as a Hospital doctor   Social History Main Topics  . Smoking status: Never Smoker   . Smokeless tobacco: Not on file  . Alcohol Use: No  . Drug Use: No  . Sexually Active: Not on file   Other Topics Concern  . Not on file   Social History Narrative   Lives alone here in [place].   Review of Systems: Pertinent items are noted in HPI.  Physical Exam: Blood pressure 161/89, pulse 61, temperature 98.3 F (36.8 C), temperature source Oral, resp. rate 16, SpO2 99.00%. Vitals reviewed. General: resting in bed, NAD, smelled of alcohol HEENT: PERRLA, EOMI, b/l conjunctival redness Cardiac: RRR, + 2/6 systolic murmur Pulm: clear to auscultation bilaterally, no wheezes, rales, or rhonchi Abd: soft, obese, nontender, nondistended, +BS present Ext: warm and well perfused, +1 pitting edema pretibial b/l, tenderness to palpation b/l lower extremities and knees, dry skin, +2d b/l Back: tenderness to palpation center of lower back Neuro: alert and oriented X3, cranial nerves II-XII grossly intact, strength 5/5 upper extremities and 4/5 b/l lower extremities, questionable effort? sensation to light touch equal in bilateral upper and lower extremities. Walks with cane.   Lab results: Basic Metabolic Panel:  [facility] 12/30/11 2150  NA 137  K 3.6  CL 106  CO2 22  GLUCOSE 100*  BUN  25*  CREATININE 1.44*  CALCIUM 8.8  MG --  PHOS --   CBC:  Basename 12/30/11 2150  WBC 5.6  NEUTROABS --  HGB 12.8  HCT 36.1  MCV 88.0  PLT 199   Cardiac  Enzymes:  Basename 12/31/11 0016 12/30/11 2253  CKTOTAL -- --  CKMB -- --  CKMBINDEX -- --  TROPONINI <0.30 <0.30   Urine Drug Screen: Drugs of Abuse     Component Value Date/Time   LABOPIA NONE DETECTED 07/14/2011 0016   LABOPIA NEGATIVE 08/10/2010 1710   COCAINSCRNUR NONE DETECTED 07/14/2011 0016   COCAINSCRNUR NEGATIVE 08/10/2010 1710   LABBENZ NONE DETECTED 07/14/2011 0016   LABBENZ POSITIVE* 08/10/2010 1710   AMPHETMU NONE DETECTED 07/14/2011 0016   AMPHETMU NEGATIVE 08/10/2010 1710   THCU NONE DETECTED 07/14/2011 0016   LABBARB NONE DETECTED 07/14/2011 0016    Alcohol Level:  Basename 12/30/11 2254  ETH 192*   Urinalysis:  Basename 12/30/11 2221  COLORURINE STRAW*  LABSPEC 1.005  PHURINE 6.0  GLUCOSEU NEGATIVE  HGBUR NEGATIVE  BILIRUBINUR NEGATIVE  KETONESUR NEGATIVE  PROTEINUR NEGATIVE  UROBILINOGEN 0.2  NITRITE NEGATIVE  LEUKOCYTESUR NEGATIVE   Imaging results:  Dg Chest Port 1 View  12/30/2011  *RADIOLOGY REPORT*  Clinical Data: 3-day history of chest pain.  PORTABLE CHEST - 1 VIEW 12/30/2011 2228 hours:  Comparison: Two-view chest x-ray 12/09/2010, 08/10/2010, 09/29/2009.  Findings: Suboptimal inspiration due to body habitus which accounts for crowded bronchovascular markings at the bases and accentuates the cardiac silhouette.  Taking this into account, cardiac silhouette upper normal in size to slightly enlarged but stable. Lungs clear.  No visible pleural effusions.  IMPRESSION: Suboptimal inspiration.  No acute cardiopulmonary disease.  Stable borderline to mild cardiomegaly.   Original Report Authenticated By: Hulan Saas, M.D.    Other results: EKG: 63bpm, NSR  Assessment & Plan by Problem: Ms. Shimada is a 61 year old lady with extensive PMH of CAD, HTN, depression, obesity, asthma, CKD  stage III, HL, and chronic alcohol abuse admitted for chest pain.  Etiology of chest pain likely unstable angina given hx of occasional shortness of breath and chest pain with exertion and now occurrence at rest.  She has been having increased frequency of episodes lately, approximately 3 this month.     Chest pain--Etiology of chest pain likely unstable angina given hx of CAD with hx of non Q wave MI x2, shortness of breath and chest pain with exertion and now occurrence at rest.  She has been having increased frequency of episodes lately, approximately 3 this month.  TIMI score: 2--8% 14-day risk of death, recurrent MI or urgent revascularization.  NSTEMI unlikely given no EKG changes and negative troponin x2.  Musculoskeletal pain is included in the differential however, given hx of CAD, frequent episodes, lack of reproducibility, and radiation to left arm makes less likely.  Additionally, PE is considered due to complaints chest pain and shortness of breath, however, Geneva score of 0.  CXR no acute cardiopulmonary disease making PNA vs pneumothorax unlikely.  Cardiac Catheterization 2004: good LV systolic function, EF 55-60%, left circumflex 10-20% proximal and mid stenosis, OM-1 very small.   Cath 2003: small vessel diffuse OM-1 disease and diffuse RCA disease.  Myoview June 2010: Mild breast attenuation of the anterior wall. No findings for ischemia or infarction. Normal wall motion and myocardial thickening with contraction. Estimated ejection fraction is 69%. Echo 2011: increased wall thickness in mild to moderate LVH, moderate LV concentric hypertrophy.  EF 55-60%.  Grade 1 diastolic dysfunction.    -admit to telemetry -cycle CE--troponin x2 negative -AM EKG -asa -coreg -statin -IV heparin -nitroglycerin PRN -no ACEI due to allergy, continue home ARB--Cozaar -IVF -NPO -call cardiology in AM--Dr. Sharyn Lull -2D Echo -  AM labs -Risk stratification: HbA1c, TSH, lipid panel   ALCOHOL  ABUSE--chronic alcohol abuse hx.  Intoxicated in ED, drank 2 beers and shots of liquor today. -CIWA protocol -continue to monitor   HTN--on home regimen Cozaar and Lopressor -continue home medications -continue to monitor -pain control   CKD stage III--Cr. 1.44 on admission, difficult to determine baseline creatinine range (2.49-1.34 over one year).  GFR 45 -AM labs -IVF -continue to monitor   Hyperlipidemia--LDL 98 04/2011.  Not currently on statin therapy. -start statin, goal of LDL<70 -outpatient follow-up recommended   Chronic back pain--MRI lumbar spine 2010: Broad-based disc bulging and left paracentral protrusion results in left lateral recess narrowing, potentially effecting the left L5 nerve root.  Minimal disc bulging into the inferior recess at L4-5 bilaterally, left worse than right without significant stenosis.  Shallow central disc protrusion and facet hypertrophy at L5-S1  without focal stenosis.  -pain control--tramadol PRN  Urinary Incontinence--wears adult diaper at home.  Claims feels urge, but cannot make it to the bathroom in time. -continue Toviaz  Acid Reflux: on PPI at home -protonix  OSA--not on CPAP at home currently -recomment repeat sleep study as outpatient  Major Depression--stable at this time.  Denies suicidal or homicidal ideation -continue home dose Wellbutrin  Diet: NPO DVT ppx: heparin Dispo: pending further clinical improvement  Signed: Darden Palmer 12/31/2011, 4:05 AM

## 2011-12-31 NOTE — ED Notes (Signed)
Dr. Loistine Chance made aware of pt's current status - bed assignment to be changed to stepdown.

## 2011-12-31 NOTE — Progress Notes (Signed)
ANTICOAGULATION CONSULT NOTE - Follow Up Consult  Pharmacy Consult for Heparin Indication: chest pain/ACS  Allergies  Allergen Reactions  . Percocet (Oxycodone-Acetaminophen) Itching  . Ace Inhibitors Other (See Comments)    REACTION: Angioedema  . Hydromorphone Hcl     REACTION: diffuse rash  . Propoxyphene-Acetaminophen Itching    REACTION: itching  . Vicodin (Hydrocodone-Acetaminophen) Itching    Patient Measurements: Height: 5\' 2"  (157.5 cm) Weight: 252 lb 10.4 oz (114.6 kg) IBW/kg (Calculated) : 50.1  Heparin Dosing Weight: 76 Kg  Vital Signs: Temp: 98.5 F (36.9 C) (11/04 0735) Temp src: Oral (11/04 0735) BP: 178/95 mmHg (11/04 0735) Pulse Rate: 67  (11/04 0735)  Labs:  Basename 12/31/11 0839 12/31/11 0016 12/30/11 2253 12/30/11 2150  HGB 12.4 -- -- 12.8  HCT 35.7* -- -- 36.1  PLT 189 -- -- 199  APTT 116* -- -- --  LABPROT 15.0 -- -- --  INR 1.20 -- -- --  HEPARINUNFRC 0.53 -- -- --  CREATININE 1.39* -- -- 1.44*  CKTOTAL -- -- -- --  CKMB -- -- -- --  TROPONINI <0.30 <0.30 <0.30 --    Estimated Creatinine Clearance: 52.2 ml/min (by C-G formula based on Cr of 1.39).   Medications:  Prescriptions prior to admission  Medication Sig Dispense Refill  . aspirin EC 81 MG tablet Take 81 mg by mouth daily.      . benzonatate (TESSALON) 100 MG capsule Take 100 mg by mouth 3 (three) times daily as needed. Cough      . buPROPion (WELLBUTRIN XL) 150 MG 24 hr tablet Take 1 tablet (150 mg total) by mouth every morning.  90 tablet  0  . fluticasone (FLONASE) 50 MCG/ACT nasal spray Place 2 sprays into the nose daily.  16 g  6  . losartan (COZAAR) 100 MG tablet Take 1 tablet (100 mg total) by mouth 2 (two) times daily.      . metoprolol (LOPRESSOR) 50 MG tablet Take 100 mg by mouth 2 (two) times daily.      . nitroGLYCERIN (NITROSTAT) 0.3 MG SL tablet Place 0.3 mg under the tongue every 5 (five) minutes as needed. For chest pain      . omeprazole (PRILOSEC) 40 MG  capsule Take 40 mg by mouth 2 (two) times daily.      . TOVIAZ 8 MG TB24 Take 8 mg by mouth Daily.        Assessment: 61 y.o. female presents with chest pain. H/o CAD (MI x 2). Heparin infusion continues, first heparin level is therapeutic, H/H and plts are stable, no bleeding reported. Noted trop negative x 3. Scr trending down slowly.  Goal of Therapy:  Heparin level 0.3-0.7 units/ml Monitor platelets by anticoagulation protocol: Yes   Plan:  - Continue Heparin infusion at 1050 units/hr, will recheck heparin level at 1600 today to confirm still therapeutic per protocol. - Will f/up plans for IV heparin infusion per cardiology/IM team - Will f/up daily heparin level and CBC  Thanks, Landri Dorsainvil K. Allena Katz, PharmD, BCPS.  Clinical Pharmacist Pager [phone]. 12/31/2011 9:58 AM

## 2011-12-31 NOTE — ED Notes (Signed)
Pt reports severe HA and refusing any more nitro at present.

## 2011-12-31 NOTE — Progress Notes (Signed)
ANTICOAGULATION CONSULT NOTE - Follow Up Consult  Pharmacy Consult for Heparin Indication: chest pain/ACS  Allergies  Allergen Reactions  . Percocet (Oxycodone-Acetaminophen) Itching  . Ace Inhibitors Other (See Comments)    REACTION: Angioedema  . Hydromorphone Hcl     REACTION: diffuse rash  . Propoxyphene-Acetaminophen Itching    REACTION: itching  . Vicodin (Hydrocodone-Acetaminophen) Itching    Patient Measurements: Height: 5\' 2"  (157.5 cm) Weight: 252 lb 10.4 oz (114.6 kg) IBW/kg (Calculated) : 50.1  Heparin Dosing Weight: 76 Kg  Vital Signs: Temp: 98.3 F (36.8 C) (11/04 1600) Temp src: Oral (11/04 1600) BP: 184/104 mmHg (11/04 1600) Pulse Rate: 62  (11/04 1600)  Labs:  Basename 12/31/11 1542 12/31/11 0839 12/31/11 0016 12/30/11 2253 12/30/11 2150  HGB -- 12.4 -- -- 12.8  HCT -- 35.7* -- -- 36.1  PLT -- 189 -- -- 199  APTT -- 116* -- -- --  LABPROT -- 15.0 -- -- --  INR -- 1.20 -- -- --  HEPARINUNFRC 0.52 0.53 -- -- --  CREATININE -- 1.39* -- -- 1.44*  CKTOTAL -- -- -- -- --  CKMB -- -- -- -- --  TROPONINI -- <0.30 <0.30 <0.30 --    Estimated Creatinine Clearance: 52.2 ml/min (by C-G formula based on Cr of 1.39).  Assessment: 61 y.o. female presents with chest pain. H/o CAD (MI x 2). Heparin infusion continues, second heparin level is therapeutic.  Goal of Therapy:  Heparin level 0.3-0.7 units/ml Monitor platelets by anticoagulation protocol: Yes   Plan:  - Continue Heparin infusion at 1050 units/hr -Will follow patient progress  Harland German, Pharm D 12/31/2011 6:03 PM

## 2011-12-31 NOTE — Progress Notes (Signed)
ANTICOAGULATION CONSULT NOTE - Initial Consult  Pharmacy Consult for Heparin Indication: chest pain/ACS  Allergies  Allergen Reactions  . Percocet (Oxycodone-Acetaminophen) Itching  . Ace Inhibitors Other (See Comments)    REACTION: Angioedema  . Hydromorphone Hcl     REACTION: diffuse rash  . Propoxyphene-Acetaminophen Itching    REACTION: itching  . Vicodin (Hydrocodone-Acetaminophen) Itching    Patient Measurements: Height: 5\' 2"  (157.5 cm) Weight: 238 lb 1.6 oz (108 kg) IBW/kg (Calculated) : 50.1  Heparin Dosing Weight: 76 kg  Vital Signs: Temp: 98.3 F (36.8 C) (11/03 2147) Temp src: Oral (11/03 2147) BP: 161/89 mmHg (11/03 2300) Pulse Rate: 61  (11/03 2300)  Labs:  Basename 12/31/11 0016 12/30/11 2253 12/30/11 2150  HGB -- -- 12.8  HCT -- -- 36.1  PLT -- -- 199  APTT -- -- --  LABPROT -- -- --  INR -- -- --  HEPARINUNFRC -- -- --  CREATININE -- -- 1.44*  CKTOTAL -- -- --  CKMB -- -- --  TROPONINI <0.30 <0.30 --    Estimated Creatinine Clearance: 48.7 ml/min (by C-G formula based on Cr of 1.44).   Medical History: Past Medical History  Diagnosis Date  . Hyperlipidemia   . Hypertension   . Gout   . Obesity   . Depression   . DJD (degenerative joint disease)   . OSA (obstructive sleep apnea)     previously used CPAP, has lost  . CAD (coronary artery disease)     s/p non-Q wave MI in 06/2001 and 2004, 2005  . ALCOHOL ABUSE 12/13/2005    Annotation: Sober since 11/06 Qualifier: Diagnosis of  By: Wallace Cullens MD, Natalia Leatherwood    . INTRINSIC ASTHMA, WITH EXACERBATION 09/27/2009    Qualifier: Diagnosis of  By: Denton Meek MD, Tillie Rung    . Chronic kidney disease (CKD), stage III (moderate) 09/19/2010    Medications:  See electronic med rec  Assessment: 61 y.o. female presents with chest pain.  H/o CAD (MI x 2). To begin heparin. CBC ok at baseline.   Goal of Therapy:  Heparin level 0.3-0.7 units/ml Monitor platelets by anticoagulation protocol: Yes   Plan:    1. Heparin IV bolus 4000 units 2. Heparin gtt at 1050 units/hr 3. F/u 6 hr heparin level 4. Daily heparin level and CBC  Christoper Fabian, PharmD, BCPS Clinical pharmacist, pager [phone] 12/31/2011,1:40 AM

## 2011-12-31 NOTE — ED Provider Notes (Signed)
History     CSN: [id number]  Arrival date & time 12/30/11  2120   First MD Initiated Contact with Patient 12/30/11 2153      Chief Complaint  Patient presents with  . Chest Pain  . Back Pain    (Consider location/radiation/quality/duration/timing/severity/associated sxs/prior treatment) HPI Comments: Level 5 caveat - pt is slightly intoxicated.  Pt comes in with cc of chest pain and back pain. Reports that the 2 pain are separate. She has hx of chronic back pain, and states that over the past few days her back pain has increased, and today she couldn't tolerate the pain and this came to the ER. No new associated numbness, weakness, urinary incontinence, urinary retention, bowel incontinence, saddle anesthesia. Pt's pain is worse with moving. No recent trauma. Not taking any meds right now for pain. Pt also has midsternal chest pain. She has hx of CAD, and stable angina, on nitro SL. She reports that the chest pain has increased in frequency as of late, and on Wednesday was extremely significant - however it has responded to nitro. Pt has no sob, nausea.   Patient is a 61 y.o. female presenting with chest pain and back pain. The history is provided by the patient.  Chest Pain Pertinent negatives for primary symptoms include no shortness of breath and no wheezing.    Back Pain  Associated symptoms include chest pain.    Past Medical History  Diagnosis Date  . Hyperlipidemia   . Hypertension   . Gout   . Obesity   . Depression   . DJD (degenerative joint disease)   . OSA (obstructive sleep apnea)     previously used CPAP, has lost  . CAD (coronary artery disease)     s/p non-Q wave MI in 06/2001 and 2004, 2005  . ALCOHOL ABUSE 12/13/2005    Annotation: Sober since 11/06 Qualifier: Diagnosis of  By: Wallace Cullens MD, Natalia Leatherwood    . INTRINSIC ASTHMA, WITH EXACERBATION 09/27/2009    Qualifier: Diagnosis of  By: Denton Meek MD, Tillie Rung    . Chronic kidney disease (CKD), stage III  (moderate) 09/19/2010    Past Surgical History  Procedure Date  . Partial hysterectomy     due to endometriosis  . Tubal ligation   . Cardiac catheterization     06/2001, 02/2002, 10/2004 (10-15% proximal stenosis of circumflex, diffuse disease of  OM1 and RCA)    Family History  Problem Relation Age of Onset  . Mental illness Mother   . Heart disease Father   . Diabetes Sister   . Diabetes Sister   . Diabetes Sister     History  Substance Use Topics  . Smoking status: Never Smoker   . Smokeless tobacco: Not on file  . Alcohol Use: No    OB History    Grav Para Term Preterm Abortions TAB SAB Ect Mult Living                  Review of Systems  Unable to perform ROS: Mental status change  Respiratory: Negative for shortness of breath and wheezing.   Cardiovascular: Positive for chest pain.  Musculoskeletal: Positive for back pain.  Neurological: Negative for syncope.    Allergies  Percocet; Ace inhibitors; Hydromorphone hcl; Propoxyphene-acetaminophen; and Vicodin  Home Medications   Current Outpatient Rx  Name  Route  Sig  Dispense  Refill  . ASPIRIN EC 81 MG PO TBEC   Oral   Take 81 mg by mouth daily.         Marland Kitchen  BENZONATATE 100 MG PO CAPS   Oral   Take 100 mg by mouth 3 (three) times daily as needed. Cough         . BUPROPION HCL ER (XL) 150 MG PO TB24   Oral   Take 1 tablet (150 mg total) by mouth every morning.   90 tablet   0   . FLUTICASONE PROPIONATE 50 MCG/ACT NA SUSP   Nasal   Place 2 sprays into the nose daily.   16 g   6   . LOSARTAN POTASSIUM 100 MG PO TABS   Oral   Take 1 tablet (100 mg total) by mouth 2 (two) times daily.         Marland Kitchen METOPROLOL TARTRATE 50 MG PO TABS   Oral   Take 100 mg by mouth 2 (two) times daily.         Marland Kitchen NITROGLYCERIN 0.3 MG SL SUBL   Sublingual   Place 0.3 mg under the tongue every 5 (five) minutes as needed. For chest pain         . OMEPRAZOLE 40 MG PO CPDR   Oral   Take 40 mg by mouth 2 (two)  times daily.         . TOVIAZ 8 MG PO TB24   Oral   Take 8 mg by mouth Daily.           BP 161/89  Pulse 61  Temp 98.3 F (36.8 C) (Oral)  Resp 16  SpO2 99%  Physical Exam  Nursing note and vitals reviewed. Constitutional: She is oriented to person, place, and time. She appears well-developed and well-nourished.  HENT:  Head: Normocephalic and atraumatic.  Eyes: EOM are normal. Pupils are equal, round, and reactive to light.  Neck: Neck supple. No JVD present.  Cardiovascular: Normal rate, regular rhythm and normal heart sounds.   No murmur heard. Pulmonary/Chest: Effort normal. No respiratory distress.  Abdominal: Soft. She exhibits no distension. There is no tenderness. There is no rebound and no guarding.  Musculoskeletal:        Pt has tenderness over the lumbar region No step offs, no erythema. Pt has 2+ patellar reflex bilaterally. Able to discriminate between sharp and dull. Able to ambulate.  Neurological: She is alert and oriented to person, place, and time.  Skin: Skin is warm and dry.    ED Course  Procedures (including critical care time)  Labs Reviewed  BASIC METABOLIC PANEL - Abnormal; Notable for the following:    Glucose, Bld 100 (*)     BUN 25 (*)     Creatinine, Ser 1.44 (*)     GFR calc non Af Amer 39 (*)     GFR calc Af Amer 45 (*)     All other components within normal limits  ETHANOL - Abnormal; Notable for the following:    Alcohol, Ethyl (B) 192 (*)     All other components within normal limits  URINALYSIS, ROUTINE W REFLEX MICROSCOPIC - Abnormal; Notable for the following:    Color, Urine STRAW (*)     All other components within normal limits  CBC  TROPONIN I  POCT I-STAT TROPONIN I  TROPONIN I   Dg Chest Port 1 View  12/30/2011  *RADIOLOGY REPORT*  Clinical Data: 3-day history of chest pain.  PORTABLE CHEST - 1 VIEW 12/30/2011 2228 hours:  Comparison: Two-view chest x-ray 12/09/2010, 08/10/2010, 09/29/2009.  Findings:  Suboptimal inspiration due to body habitus which accounts for crowded  bronchovascular markings at the bases and accentuates the cardiac silhouette.  Taking this into account, cardiac silhouette upper normal in size to slightly enlarged but stable. Lungs clear.  No visible pleural effusions.  IMPRESSION: Suboptimal inspiration.  No acute cardiopulmonary disease.  Stable borderline to mild cardiomegaly.   Original Report Authenticated By: Hulan Saas, M.D.      No diagnosis found.    MDM  Pt comes in with cc of chest pain and back pain.  Both are 2 separate pains, but she is slightly intoxicated, so CXR and bilateral BP ordered to ensure there is no glaring evidence of possible dissection. Pretest probability for dissection is low based on hx and exam.  Chest pain - seems like she has stable angina with increased frequency in chest pain. No recent provocative testing seen in our system, does state that she has a Cardiologist that she follows with. By definition she has unstable angina, and we will call the rpimary team for admission vs. Close follow up (if they have access to any cardiology testing that might make unstable angina less likely).  Back pain - chronic, no new findings, with neuro exam normal. Will control pain. No indication for imaging, no risk factors for infectious process.   Date: 12/31/2011  Rate: 63  Rhythm: normal sinus rhythm  QRS Axis: normal  Intervals: normal  ST/T Wave abnormalities: normal  Conduction Disutrbances: none  Narrative Interpretation: unremarkable           Derwood Kaplan, MD 12/31/11 0020

## 2012-01-01 ENCOUNTER — Inpatient Hospital Stay (HOSPITAL_COMMUNITY): Payer: Medicare Other

## 2012-01-01 ENCOUNTER — Encounter (HOSPITAL_COMMUNITY): Payer: Medicare Other | Attending: Cardiology

## 2012-01-01 DIAGNOSIS — I10 Essential (primary) hypertension: Secondary | ICD-10-CM | POA: Insufficient documentation

## 2012-01-01 DIAGNOSIS — E7800 Pure hypercholesterolemia, unspecified: Secondary | ICD-10-CM | POA: Diagnosis not present

## 2012-01-01 DIAGNOSIS — I252 Old myocardial infarction: Secondary | ICD-10-CM | POA: Diagnosis not present

## 2012-01-01 DIAGNOSIS — I2510 Atherosclerotic heart disease of native coronary artery without angina pectoris: Secondary | ICD-10-CM | POA: Diagnosis not present

## 2012-01-01 DIAGNOSIS — R079 Chest pain, unspecified: Secondary | ICD-10-CM | POA: Diagnosis not present

## 2012-01-01 LAB — CBC
Hemoglobin: 12 g/dL (ref 12.0–15.0)
MCH: 30.8 pg (ref 26.0–34.0)
Platelets: 167 10*3/uL (ref 150–400)
RBC: 3.9 MIL/uL (ref 3.87–5.11)
WBC: 4.4 10*3/uL (ref 4.0–10.5)

## 2012-01-01 LAB — HEPARIN LEVEL (UNFRACTIONATED): Heparin Unfractionated: 0.3 IU/mL (ref 0.30–0.70)

## 2012-01-01 MED ORDER — AMLODIPINE BESYLATE 10 MG PO TABS: 10.0000 mg | ORAL_TABLET | Freq: Every day | ORAL | Status: DC

## 2012-01-01 MED ORDER — TECHNETIUM TC 99M SESTAMIBI GENERIC - CARDIOLITE
30.0000 | Freq: Once | INTRAVENOUS | Status: AC | PRN
  Administered 2012-01-01: 30 via INTRAVENOUS

## 2012-01-01 MED ORDER — HEPARIN (PORCINE) IN NACL 100-0.45 UNIT/ML-% IJ SOLN
1150.0000 [IU]/h | INTRAMUSCULAR | Status: DC
  Administered 2012-01-01: 1150 [IU]/h via INTRAVENOUS
  Filled 2012-01-01 (×2): qty 250

## 2012-01-01 NOTE — Progress Notes (Signed)
Subjective:  Patient denies any chest pain or shortness of breath. Reviewed nuclear scan and old cardiac catheterization . Nuclear stress test appears to be falsely abnormal 2-D echo also showed no wall motion abnormalities with good LV systolic function  Objective:  Vital Signs in the last 24 hours: Temp:  [97.4 F (36.3 C)-98.3 F (36.8 C)] 98.2 F (36.8 C) (11/05 1145) Pulse Rate:  [58-62] 62  (11/04 2000) Resp:  [13-19] 14  (11/05 1145) BP: (146-201)/(81-112) 148/93 mmHg (11/05 1145) SpO2:  [91 %-99 %] 97 % (11/05 1145)  Intake/Output from previous day: 11/04 0701 - 11/05 0700 In: 694.5 [I.V.:694.5] Out: 725 [Urine:725] Intake/Output from this shift: Total I/O In: 85.5 [I.V.:85.5] Out: -   Physical Exam: Neck: no adenopathy, no carotid bruit, no JVD and supple, symmetrical, trachea midline Lungs: clear to auscultation bilaterally Heart: regular rate and rhythm, S1, S2 normal and Soft systolic murmur noted Abdomen: soft, non-tender; bowel sounds normal; no masses,  no organomegaly Extremities: extremities normal, atraumatic, no cyanosis or edema  Lab Results:  Basename 01/01/12 0530 12/31/11 0839  WBC 4.4 4.4  HGB 12.0 12.4  PLT 167 189    Basename 12/31/11 0839 12/30/11 2150  NA 141 137  K 4.4 3.6  CL 108 106  CO2 25 22  GLUCOSE 86 100*  BUN 21 25*  CREATININE 1.39* 1.44*    Basename 12/31/11 0839 12/31/11 0016  TROPONINI <0.30 <0.30   Hepatic Function Panel  Basename 12/31/11 0206  PROT 7.5  ALBUMIN 3.4*  AST 19  ALT 11  ALKPHOS 148*  BILITOT 0.3  BILIDIR <0.1  IBILI NOT CALCULATED   No results found for this basename: CHOL in the last 72 hours No results found for this basename: PROTIME in the last 72 hours  Imaging: Imaging results have been reviewed and Nm Myocar Multi W/spect W/wall Motion / Ef  01/01/2012  Clinical Data:  Chest pain.  Hypertension.  Technique:  Standard myocardial SPECT imaging performed after resting intravenous injection  of Tc-18m tetrofosmin.  Subsequently, stress in the form of Lexiscan  was administered under the supervision of the Cardiology staff.  At peak stress, Tc-61m tetrofosmin was injected intravenously and standard myocardial SPECT imaging performed.  Quantitative gated imaging also performed to evaluate left ventricular wall motion and estimate left ventricular ejection fraction.  Radiopharmaceutical: Tc-28m tetrofosmin, 30 mCi at rest and 30 mCi at stress.  Comparison:  08/19/2008  MYOCARDIAL IMAGING WITH SPECT (REST AND STRESS)  Findings:  Breathing motion artifact reduces diagnostic sensitivity and specificity.  Although there is some reduced activity in the anterior wall on both stress and rest images, partially attributable to breast attenuation and motion artifact, the degree of reduction is greater on the stress images by about 15%, raising suspicion for inducible ischemia in the anterior wall of the mid heart potentially extending into the anteroseptal and anterolateral segments.  LEFT VENTRICULAR EJECTION FRACTION  Findings:  Left ventricular end-diastolic volume is 100 cc.  End- systolic volume is 38 cc.  Derived LV ejection fraction is 62%.  GATED LEFT VENTRICULAR WALL MOTION STUDY  Findings:  Left ventricular wall motion and wall thickening appear normal.  IMPRESSION:  1.  Reduced activity in the anterior wall of the left ventricle and adjacent anterolateral and anteroseptal segments is partially attributable to breast attenuation and motion artifact, but the degree of reduced activity is greater on stress images compared to rest images, raising suspicion for mild inducible ischemia. Admittedly the motion artifact does comes cause some  reduction in positive predictive value.  Careful correlation with the ECG portion of the exam is recommended.   Original Report Authenticated By: Gaylyn Rong, M.D.    Dg Chest Port 1 View  12/30/2011  *RADIOLOGY REPORT*  Clinical Data: 3-day history of chest pain.   PORTABLE CHEST - 1 VIEW 12/30/2011 2228 hours:  Comparison: Two-view chest x-ray 12/09/2010, 08/10/2010, 09/29/2009.  Findings: Suboptimal inspiration due to body habitus which accounts for crowded bronchovascular markings at the bases and accentuates the cardiac silhouette.  Taking this into account, cardiac silhouette upper normal in size to slightly enlarged but stable. Lungs clear.  No visible pleural effusions.  IMPRESSION: Suboptimal inspiration.  No acute cardiopulmonary disease.  Stable borderline to mild cardiomegaly.   Original Report Authenticated By: Hulan Saas, M.D.     Cardiac Studies:  Assessment/Plan:  Status post chest pain MI ruled out no significant ischemia nuclear scan Mild small distal vessel disease History of very small non-Q-wave MI in the past Uncontrolled hypertension Hypercholesteremia Obstructive sleep apnea Morbid obesity Gouty arthritis Degenerative joint disease Chronic kidney disease Depression History of EtOH abuse Plan Discussed with patient regarding stress test finding and the prior cath and various options of treatment and agree for medical management doubt patient had significant coronary artery disease. Discussed with patient regarding lifestyle modification diet exercise and compliance with medication and followup. Okay to discharge from cardiac point of view her followup with me in one week  LOS: 2 days    Vanessa Palmer 01/01/2012, 11:58 AM

## 2012-01-01 NOTE — Progress Notes (Signed)
Subjective: She underwent nuclear stress test this morning and tolerated it well. She had a headache after the stress test but it improved after pain medications. She denies chest pain or shortness of breath.  Objective: Vital signs in last 24 hours: Filed Vitals:   12/31/11 2000 12/31/11 2351 01/01/12 0340 01/01/12 0354  BP: 171/98 148/81 159/93   Pulse: 62     Temp:  98.1 F (36.7 C)  98.1 F (36.7 C)  TempSrc:  Oral  Oral  Resp: 13     Height:      Weight:      SpO2:  91% 98% 99%   Weight change:   Intake/Output Summary (Last 24 hours) at 01/01/12 0821 Last data filed at 01/01/12 0700  Gross per 24 hour  Intake  694.5 ml  Output    725 ml  Net  -30.5 ml   Vitals reviewed. General: resting in bed, in NAD HEENT:  no scleral icterus Cardiac: RRR, 2/6 systolic murmur heard best at the RUSB, no rubs, or gallops Pulm: clear to auscultation bilaterally, no wheezes, rales, or rhonchi Abd: soft, nontender, nondistended, BS present Ext: warm and well perfused, no pedal edema Neuro: alert and oriented X3, cranial nerves II-XII grossly intact, strength and sensation to light touch equal in bilateral upper and lower extremities  Lab Results: Basic Metabolic Panel:  Lab 12/31/11 4098 12/31/11 0206 12/30/11 2150  NA 141 -- 137  K 4.4 -- 3.6  CL 108 -- 106  CO2 25 -- 22  GLUCOSE 86 -- 100*  BUN 21 -- 25*  CREATININE 1.39* -- 1.44*  CALCIUM 8.5 -- 8.8  MG -- 1.7 --  PHOS -- -- --   Liver Function Tests:  Lab 12/31/11 0206  AST 19  ALT 11  ALKPHOS 148*  BILITOT 0.3  PROT 7.5  ALBUMIN 3.4*   CBC:  Lab 01/01/12 0530 12/31/11 0839  WBC 4.4 4.4  NEUTROABS -- --  HGB 12.0 12.4  HCT 34.6* 35.7*  MCV 88.7 88.4  PLT 167 189   Cardiac Enzymes:  Lab 12/31/11 0839 12/31/11 0016 12/30/11 2253  CKTOTAL -- -- --  CKMB -- -- --  CKMBINDEX -- -- --  TROPONINI <0.30 <0.30 <0.30   Hemoglobin A1C:  Lab 12/31/11 0206  HGBA1C 5.8*   Thyroid Function Tests:  Lab  12/31/11 0206  TSH 1.207  T4TOTAL --  FREET4 --  T3FREE --  THYROIDAB --   Coagulation:  Lab 12/31/11 0839  LABPROT 15.0  INR 1.20   Urine Drug Screen: Drugs of Abuse     Component Value Date/Time   LABOPIA NONE DETECTED 12/30/2011 2221   LABOPIA NEGATIVE 08/10/2010 1710   COCAINSCRNUR NONE DETECTED 12/30/2011 2221   COCAINSCRNUR NEGATIVE 08/10/2010 1710   LABBENZ NONE DETECTED 12/30/2011 2221   LABBENZ POSITIVE* 08/10/2010 1710   AMPHETMU NONE DETECTED 12/30/2011 2221   AMPHETMU NEGATIVE 08/10/2010 1710   THCU NONE DETECTED 12/30/2011 2221   LABBARB NONE DETECTED 12/30/2011 2221    Alcohol Level:  Lab 12/30/11 2254  ETH 192*   Urinalysis:  Lab 12/30/11 2221  COLORURINE STRAW*  LABSPEC 1.005  PHURINE 6.0  GLUCOSEU NEGATIVE  HGBUR NEGATIVE  BILIRUBINUR NEGATIVE  KETONESUR NEGATIVE  PROTEINUR NEGATIVE  UROBILINOGEN 0.2  NITRITE NEGATIVE  LEUKOCYTESUR NEGATIVE    Micro Results: Recent Results (from the past 240 hour(s))  MRSA PCR SCREENING     Status: Normal   Collection Time   12/31/11  4:12 AM  Component Value Range Status Comment   MRSA by PCR NEGATIVE  NEGATIVE Final    Studies/Results: Dg Chest Port 1 View  12/30/2011  *RADIOLOGY REPORT*  Clinical Data: 3-day history of chest pain.  PORTABLE CHEST - 1 VIEW 12/30/2011 2228 hours:  Comparison: Two-view chest x-ray 12/09/2010, 08/10/2010, 09/29/2009.  Findings: Suboptimal inspiration due to body habitus which accounts for crowded bronchovascular markings at the bases and accentuates the cardiac silhouette.  Taking this into account, cardiac silhouette upper normal in size to slightly enlarged but stable. Lungs clear.  No visible pleural effusions.  IMPRESSION: Suboptimal inspiration.  No acute cardiopulmonary disease.  Stable borderline to mild cardiomegaly.   Original Report Authenticated By: Hulan Saas, M.D.    Medications: I have reviewed the patient's current medications. Scheduled Meds:   . amLODipine   10 mg Oral Daily  . aspirin EC  81 mg Oral Daily  . atorvastatin  80 mg Oral q1800  . buPROPion  150 mg Oral BH-q7a  . fesoterodine  8 mg Oral Daily  . folic acid  1 mg Oral Daily  . losartan  100 mg Oral BID  . metoprolol  100 mg Oral BID  . multivitamin with minerals  1 tablet Oral Daily  . pantoprazole  40 mg Oral Daily  . [COMPLETED] regadenoson  0.4 mg Intravenous Once  . thiamine  100 mg Oral Daily   Or  . thiamine  100 mg Intravenous Daily  . [DISCONTINUED] amLODipine  5 mg Oral Daily   Continuous Infusions:   . sodium chloride 75 mL/hr at 12/31/11 0600  . heparin 1,050 Units/hr (01/01/12 0700)   PRN Meds:.diphenhydrAMINE, LORazepam, LORazepam, nitroGLYCERIN, ondansetron (ZOFRAN) IV, [COMPLETED] technetium sestamibi generic, [COMPLETED] technetium sestamibi generic, traMADol Assessment/Plan: Vanessa Palmer is a 61 year old lady with extensive PMH of CAD, HTN, depression, obesity, asthma, CKD stage III, HL, and chronic alcohol abuse admitted for chest pain. Etiology of chest pain likely unstable angina given hx of occasional shortness of breath and chest pain with exertion and now occurrence at rest. She has been having increased frequency of episodes lately, approximately 3 this month.   Unstable Angina. Etiology of chest pain likely unstable angina given hx of CAD with hx of non Q wave MI x2, shortness of breath and chest pain with exertion and now occurrence at rest. She has been having increased frequency of episodes lately, approximately 3 this month. TIMI score: 2--8% 14-day risk of death, recurrent MI or urgent revascularization. NSTEMI unlikely given no EKG changes and negative troponin x2. Musculoskeletal pain is included in the differential however, given hx of CAD, frequent episodes, lack of reproducibility, and radiation to left arm makes less likely. Additionally, PE was considered due to complaints of chest pain and shortness of breath, however, Geneva score of 0. CXR with  no acute cardiopulmonary disease making PNA vs pneumothorax unlikely.  - Cardiac Catheterization 2004: good LV systolic function, EF 55-60%, left circumflex 10-20% proximal and mid stenosis, OM-1 very small. Cath 2003: small vessel diffuse OM-1 disease and diffuse RCA disease. - Myoview June 2010: Mild breast attenuation of the anterior wall. No findings for ischemia or infarction. Normal wall motion and myocardial thickening with contraction. Estimated ejection fraction is 69%.  - Echo 2011: increased wall thickness in mild to moderate LVH, moderate LV concentric hypertrophy. EF 55-60%. Grade 1 diastolic dysfunction.   Symptoms are concerning for UA, as her CE are negative and no changes noted on EKG from previous. Patient has been seen  by Dr. Sharyn Lull in Cardiology and is s/p 2-D echo and has undergone nuclear stress test this morning. Her echo shows EF of 55% to 60% with no wall motion abnormalities. Her nuclear stress test shows reduced activity in the anterior wall of the left ventricle and adjacent anterolateral and anteroseptal segments raising suspicion for mild inducible ischemia but this could be due to motion artifact. Per Dr. Sharyn Lull, patient is clinically stable and will not undergo cardiac catheterization, she needs medical management with better BP control and life-style modification (decrease EtOH consumption). He will see her as outpatient next week.  - Heparin drip - Statin, Asa, coreg, arb (no ace-I due to allergy), statin -nitroglycerin PRN  -IVF  -Risk stratification: HbA1c, TSH, lipid panel   ALCOHOL ABUSE--chronic alcohol abuse hx. Intoxicated in ED, drank 2 beers and shots of liquor on the day of admission.   -CIWA protocol  -continue to monitor   HTN-on home regimen Cozaar and Lopressor  -continue home medications  -continue to monitor  -pain control   CKD stage III--Cr. 1.44 on admission, difficult to determine baseline creatinine range (2.49-1.34 over one year). GFR 45    -AM labs  -IVF  -continue to monitor   Hyperlipidemia--LDL 98 04/2011. Not currently on statin therapy.  -started statin, goal of LDL<70  -outpatient follow-up recommended   Chronic back pain--MRI lumbar spine 2010: Broad-based disc bulging and left paracentral protrusion results in left lateral recess narrowing, potentially effecting the left L5 nerve root. Minimal disc bulging into the inferior recess at L4-5 bilaterally, left worse than right without significant stenosis. Shallow central disc protrusion and facet hypertrophy at L5-S1  without focal stenosis.  -pain control--tramadol PRN   Urinary Incontinence--wears adult diaper at home. Claims feels urge, but cannot make it to the bathroom in time.  -continue Toviaz   Acid Reflux: on PPI at home  -protonix   OSA--not on CPAP at home currently  -recomment repeat sleep study as outpatient   Major Depression--stable at this time. Denies suicidal or homicidal ideation  -continue home dose Wellbutrin  Diet: NPO  DVT ppx: heparin  Dispo: patient might go home today if she remains clinically stable.     LOS: 2 days   Ky Barban 01/01/2012, 8:21 AM

## 2012-01-01 NOTE — Progress Notes (Signed)
Reviewed discharge instructions with family and patient. Patient and family understood. Patient calling a cab and will be escorted via her own wheelchair.  Vanessa Palmer, [name]

## 2012-01-01 NOTE — Progress Notes (Signed)
ANTICOAGULATION CONSULT NOTE - Follow Up Consult  Pharmacy Consult for Heparin Indication: chest pain/ACS  Allergies  Allergen Reactions  . Percocet (Oxycodone-Acetaminophen) Itching  . Ace Inhibitors Other (See Comments)    REACTION: Angioedema  . Hydromorphone Hcl     REACTION: diffuse rash  . Propoxyphene-Acetaminophen Itching    REACTION: itching  . Vicodin (Hydrocodone-Acetaminophen) Itching    Patient Measurements: Height: 5\' 2"  (157.5 cm) Weight: 252 lb 10.4 oz (114.6 kg) IBW/kg (Calculated) : 50.1  Heparin Dosing Weight: 76kg  Vital Signs: Temp: 98.1 F (36.7 C) (11/05 0354) Temp src: Oral (11/05 0354) BP: 179/100 mmHg (11/05 0840)  Labs:  Basename 01/01/12 0530 12/31/11 1542 12/31/11 0839 12/31/11 0016 12/30/11 2253 12/30/11 2150  HGB 12.0 -- 12.4 -- -- --  HCT 34.6* -- 35.7* -- -- 36.1  PLT 167 -- 189 -- -- 199  APTT -- -- 116* -- -- --  LABPROT -- -- 15.0 -- -- --  INR -- -- 1.20 -- -- --  HEPARINUNFRC 0.30 0.52 0.53 -- -- --  CREATININE -- -- 1.39* -- -- 1.44*  CKTOTAL -- -- -- -- -- --  CKMB -- -- -- -- -- --  TROPONINI -- -- <0.30 <0.30 <0.30 --    Estimated Creatinine Clearance: 52.2 ml/min (by C-G formula based on Cr of 1.39).   Medications:  Heparin @ 1050 units/hr  Assessment: 61yof continues on heparin with a therapeutic heparin level although on the lower end of goal today. No issues with infusion. She went for nuclear stress test this morning - results pending. CBC stable. No bleeding reported.  Goal of Therapy:  Heparin level 0.3-0.7 units/ml Monitor platelets by anticoagulation protocol: Yes   Plan:  1) Increase heparin to 1150 units/hr 2) Follow up heparin level and CBC in AM 3) Follow up nuclear stress test results and further plan  Fredrik Rigger 01/01/2012,8:56 AM

## 2012-01-09 ENCOUNTER — Ambulatory Visit (INDEPENDENT_AMBULATORY_CARE_PROVIDER_SITE_OTHER): Payer: Medicare Other | Admitting: Internal Medicine

## 2012-01-09 ENCOUNTER — Encounter: Payer: Self-pay | Admitting: Internal Medicine

## 2012-01-09 VITALS — BP 159/91 | HR 53 | Temp 97.7°F | Ht 62.0 in | Wt 259.9 lb

## 2012-01-09 DIAGNOSIS — I10 Essential (primary) hypertension: Secondary | ICD-10-CM

## 2012-01-09 DIAGNOSIS — F1010 Alcohol abuse, uncomplicated: Secondary | ICD-10-CM

## 2012-01-09 DIAGNOSIS — K219 Gastro-esophageal reflux disease without esophagitis: Secondary | ICD-10-CM | POA: Insufficient documentation

## 2012-01-09 DIAGNOSIS — E785 Hyperlipidemia, unspecified: Secondary | ICD-10-CM

## 2012-01-09 DIAGNOSIS — M2550 Pain in unspecified joint: Secondary | ICD-10-CM

## 2012-01-09 DIAGNOSIS — I2510 Atherosclerotic heart disease of native coronary artery without angina pectoris: Secondary | ICD-10-CM

## 2012-01-09 MED ORDER — FAMOTIDINE 20 MG PO TABS: 20.0000 mg | ORAL_TABLET | Freq: Two times a day (BID) | ORAL | Status: DC

## 2012-01-09 MED ORDER — MORPHINE SULFATE 15 MG PO TABS: 15.0000 mg | ORAL_TABLET | Freq: Four times a day (QID) | ORAL | Status: DC | PRN

## 2012-01-09 MED ORDER — ATORVASTATIN CALCIUM 20 MG PO TABS: 20.0000 mg | ORAL_TABLET | Freq: Every day | ORAL | Status: DC

## 2012-01-09 MED ORDER — ACETAMINOPHEN 325 MG PO TABS: 650.0000 mg | ORAL_TABLET | ORAL | Status: DC | PRN

## 2012-01-09 NOTE — Progress Notes (Signed)
Patient ID: Vanessa Palmer, female   DOB: 21-Jan-1951, 61 y.o.   MRN: [id number]  Subjective:   Patient ID: Vanessa Palmer female   DOB: 04-22-1950 61 y.o.   MRN: [id number]  HPI: [name] is a 61 y.o. woman with past medical history significant for hyperlipidemia, chest pain, obesity, degenerative joint disease, obstructive sleep apnea, who was recently evaluated in the hospital for chest pain. She is here for a hospital followup visit with complaints of abdominal pain, and severe pain in her legs.  Abdominal pain: The patient is complaining of abdominal pain with burning sensation in the upper side of her abdomen that has not adequately relieved with her current dose of omeprazole. She tried her friend's tablets, which worked well for her with relief of this abdominal pain. She denies history of diarrhea, nausea, or vomiting, associated with this abdominal pain. She describes it to be other severity of  7/10 when it is severe. The pain is burning in nature.  Bilateral knee pain: The patient reports severe pain involving both knees, which has made her very difficult to walk. She describes her pain as constant, and very severe the scale of 9/10. Her med list includes no pain medication except for cardiac aspirin of 81 mg daily. During the previous visit, the patient was prescribed morphine at 15 mg when necessary for pain. She reports itching associated with opiates, but she can put up with the itching as long as the pain is well controlled.  Chest pain: She reports that she is short of the hospital, she has not had any more chest pain. She had a Myoview scan, which was indeterminate. She was supposed to see Dr. Sharyn Lull, her cardiologist, yesterday, but she called and canceled her appointment. She promises to call again and reschedule.    Past Medical History  Diagnosis Date  . Hyperlipidemia   . Hypertension   . Gout   . Obesity   . Depression   . DJD (degenerative joint disease)     . OSA (obstructive sleep apnea)     previously used CPAP, has lost  . CAD (coronary artery disease)     s/p non-Q wave MI in 06/2001 and 2004, 2005  . ALCOHOL ABUSE 12/13/2005    Annotation: Sober since 11/06 Qualifier: Diagnosis of  By: Wallace Cullens MD, Natalia Leatherwood    . INTRINSIC ASTHMA, WITH EXACERBATION 09/27/2009    Qualifier: Diagnosis of  By: Denton Meek MD, Tillie Rung    . Chronic kidney disease (CKD), stage III (moderate) 09/19/2010   Current Outpatient Prescriptions  Medication Sig Dispense Refill  . amLODipine (NORVASC) 10 MG tablet Take 1 tablet (10 mg total) by mouth daily.  30 tablet  2  . aspirin EC 81 MG tablet Take 81 mg by mouth daily.      . benzonatate (TESSALON) 100 MG capsule Take 100 mg by mouth 3 (three) times daily as needed. Cough      . buPROPion (WELLBUTRIN XL) 150 MG 24 hr tablet Take 1 tablet (150 mg total) by mouth every morning.  90 tablet  0  . fluticasone (FLONASE) 50 MCG/ACT nasal spray Place 2 sprays into the nose daily.  16 g  6  . losartan (COZAAR) 100 MG tablet Take 1 tablet (100 mg total) by mouth 2 (two) times daily.      . metoprolol (LOPRESSOR) 50 MG tablet Take 100 mg by mouth 2 (two) times daily.      Marland Kitchen morphine (MSIR) 15 MG tablet Take  1 tablet (15 mg total) by mouth every 6 (six) hours as needed for pain.  20 tablet  0  . nitroGLYCERIN (NITROSTAT) 0.3 MG SL tablet Place 0.3 mg under the tongue every 5 (five) minutes as needed. For chest pain      . omeprazole (PRILOSEC) 40 MG capsule Take 40 mg by mouth 2 (two) times daily.      . TOVIAZ 8 MG TB24 Take 8 mg by mouth Daily.      Marland Kitchen acetaminophen (TYLENOL) 325 MG tablet Take 2 tablets (650 mg total) by mouth every 4 (four) hours as needed for pain.  100 tablet  2  . atorvastatin (LIPITOR) 20 MG tablet Take 1 tablet (20 mg total) by mouth daily.  30 tablet  11  . famotidine (PEPCID) 20 MG tablet Take 1 tablet (20 mg total) by mouth 2 (two) times daily.  30 tablet  1   Family History  Problem Relation Age of Onset   . Mental illness Mother   . Heart disease Father   . Diabetes Sister   . Diabetes Sister   . Diabetes Sister    History   Social History  . Marital Status: Single    Spouse Name: N/A    Number of Children: 3  . Years of Education: GED   Occupational History  . retired     previously worked as a Hospital doctor   Social History Main Topics  . Smoking status: Never Smoker   . Smokeless tobacco: None  . Alcohol Use: No  . Drug Use: No  . Sexually Active: None   Other Topics Concern  . None   Social History Narrative   Lives alone here in [place].   Review of Systems: Review of Systems  Constitutional: Negative for fever, chills, weight loss, malaise/fatigue and diaphoresis.  HENT: Negative.   Eyes: Negative.   Respiratory: Positive for shortness of breath. Negative for cough, hemoptysis, sputum production and wheezing.   Cardiovascular: Negative for chest pain, palpitations, orthopnea, claudication, leg swelling and PND.  Gastrointestinal: Positive for heartburn and abdominal pain. Negative for nausea, vomiting, diarrhea, constipation, blood in stool and melena.  Genitourinary: Negative for dysuria, urgency, frequency, hematuria and flank pain.  Musculoskeletal: Positive for joint pain.  Skin: Negative.   Neurological: Negative.  Negative for headaches.  Endo/Heme/Allergies: Negative.   Psychiatric/Behavioral: Negative.     Objective:  Physical Exam: Filed Vitals:   01/09/12 1454  BP: 159/91  Pulse: 53  Temp: 97.7 F (36.5 C)  TempSrc: Oral  Height: 5\' 2"  (1.575 m)  Weight: 259 lb 14.4 oz (117.89 kg)  SpO2: 99%   Physical Exam  Constitutional: She is oriented to person, place, and time. She appears well-developed and well-nourished. No distress.  HENT:  Head: Normocephalic and atraumatic.  Right Ear: External ear normal.  Eyes: EOM are normal. Pupils are equal, round, and reactive to light. Right eye exhibits no discharge. Left eye exhibits no  discharge.  Neck: Normal range of motion. Neck supple.  Cardiovascular: Normal rate and regular rhythm.  Exam reveals no gallop and no friction rub.   No murmur heard. Pulmonary/Chest: Effort normal and breath sounds normal. No respiratory distress. She has no wheezes. She has no rales. She exhibits no tenderness.  Abdominal: Soft. Bowel sounds are normal.  Genitourinary: Guaiac negative stool. No vaginal discharge found.  Musculoskeletal: She exhibits tenderness. She exhibits no edema.       Right knee: She exhibits bony tenderness. She exhibits no  swelling, no effusion, no deformity, no laceration, normal alignment and no LCL laxity. tenderness found.       Left knee: She exhibits normal range of motion, no swelling, no effusion and no ecchymosis. tenderness found.  Neurological: She is alert and oriented to person, place, and time.  Skin: She is not diaphoretic.    Assessment & Plan:  I have discussed the assessment and plan for the care of Ms. Truax with Dr. Eben Burow as detailed under each problem.  In brief, she will be started on Lipitor for hypercholesteremia. I have added pepcid to her Omeprazole for burning abdominal pain. I have prescribed Morphine to take PRN as needed. She will follow up with Dr Criselda Peaches on 11/18 and at that time her BP will be rechecked and if needed to add another HTN medication.

## 2012-01-09 NOTE — Assessment & Plan Note (Signed)
Her BP is still elevated at 159/91. She on losartan, Amlodipine and Metoprolol however, she appears unsure about her medications. I have encouraged her to bring her medications when she comes back to see Dr Criselda Peaches on her next visit.   Plan  Continue with the current medications Check BP in one week and make changes to the regimen as needed.

## 2012-01-09 NOTE — Assessment & Plan Note (Signed)
Her recent LDL was 98. She is not on any Statin. Given her high risk factors for CAD, she will benefit from a low dose statin  Plan  - Start Lipitor at 20 mg once daily

## 2012-01-09 NOTE — Patient Instructions (Addendum)
Please start atorvastatin for your high cholesterol. Please start taking Pepcid for your acid reflux. Please start taking morphine for pain. Please start using acetaminophen commonly known as Tylenol for your pain is well. Only use morphine for severe pain that is not controlled by Tylenol. Please come back to the clinic to recheck your blood pressure. Meanwhile, continue taking your blood pressure medications as prescribed. Please stop taking alcohol

## 2012-01-09 NOTE — Assessment & Plan Note (Signed)
She is on Omeprazole at 40mg  daily and she still reports burning abdominal pain with acid reflux.   Plan  - added Pepcid  - follow up in a week

## 2012-01-09 NOTE — Assessment & Plan Note (Signed)
She reports severe pain in her legs mainly the knees. During her last visit she saw Dr Tonny Branch who prescribed for her some morphine tablets and she reported good relief from this. However, she have some itching with opiates but she says that she can put up with that if her pain is better. She has a pain contract with [name].   Plan  - Gave her 20 tables of morphine  - Gave some tylenol with recommendation to rely on these more and only use the morphine is the pain does not get controlled with Tylenol

## 2012-01-12 NOTE — Discharge Summary (Signed)
Internal Medicine Teaching [facility] Discharge Note  Name: Vanessa Palmer MRN: [id number] DOB: 1950-08-11 61 y.o.  Date of Admission: 12/30/2011  9:46 PM Date of Discharge: 01/01/12 Attending Physician: Dr. Criselda Peaches  Discharge Diagnosis: Principal Problem:  *Chest pain Active Problems:  HYPERLIPIDEMIA  Morbid obesity  MAJOR DPRSV DISORDER RECURRENT EPISODE MODERATE  ALCOHOL ABUSE  SLEEP APNEA, OBSTRUCTIVE  HYPERTENSION  CORONARY ARTERY DISEASE  Chronic kidney disease (CKD), stage III (moderate)  Back pain  Urinary incontinence   Discharge Medications:   Medication List     As of 01/12/2012  9:02 PM    TAKE these medications         amLODipine 10 MG tablet   Commonly known as: NORVASC   Take 1 tablet (10 mg total) by mouth daily.      aspirin EC 81 MG tablet   Take 81 mg by mouth daily.      benzonatate 100 MG capsule   Commonly known as: TESSALON   Take 100 mg by mouth 3 (three) times daily as needed. Cough      buPROPion 150 MG 24 hr tablet   Commonly known as: WELLBUTRIN XL   Take 1 tablet (150 mg total) by mouth every morning.      fluticasone 50 MCG/ACT nasal spray   Commonly known as: FLONASE   Place 2 sprays into the nose daily.      losartan 100 MG tablet   Commonly known as: COZAAR   Take 1 tablet (100 mg total) by mouth 2 (two) times daily.      metoprolol 50 MG tablet   Commonly known as: LOPRESSOR   Take 100 mg by mouth 2 (two) times daily.      nitroGLYCERIN 0.3 MG SL tablet   Commonly known as: NITROSTAT   Place 0.3 mg under the tongue every 5 (five) minutes as needed. For chest pain      omeprazole 40 MG capsule   Commonly known as: PRILOSEC   Take 40 mg by mouth 2 (two) times daily.      TOVIAZ 8 MG Tb24   Generic drug: fesoterodine   Take 8 mg by mouth Daily.        Disposition and follow-up:   Vanessa Palmer was discharged from [facility] in stable condition.  At the hospital follow up visit please  address resolution of her chest pain, compliance with her follow up with Cardiology, Dr. Sharyn Lull (had an appointment), and alcohol abstinence. She will also benefit from a sleep study as she does have OSA. Please also follow up on her LDL value and initiate statin as her goal LDL is <70.   Follow-up Appointments:     Follow-up Information    Follow up with Robynn Pane, MD. (Tuesday, November 12th at 2:15PM)    Contact information:   [address] [place] [address] [phone]       Follow up with Dow Adolph, MD. (Wednesday, November 13th at 3:30PM)    Contact information:   [address] [place] [address] [phone]         Discharge Orders    Future Appointments: Provider: Department: Dept Phone: Center:   01/14/2012 10:30 AM Inez Catalina, MD [facility] [phone] [facility]     Future Orders Please Complete By Expires   Diet - low sodium heart healthy      Increase activity slowly  Consultations: Dr. Sharyn Lull, Cardiology  Procedures Performed:  Nm Myocar Multi W/spect W/wall Motion / Ef  01/01/2012  Clinical Data:  Chest pain.  Hypertension.  Technique:  Standard myocardial SPECT imaging performed after resting intravenous injection of Tc-24m tetrofosmin.  Subsequently, stress in the form of Lexiscan  was administered under the supervision of the Cardiology staff.  At peak stress, Tc-62m tetrofosmin was injected intravenously and standard myocardial SPECT imaging performed.  Quantitative gated imaging also performed to evaluate left ventricular wall motion and estimate left ventricular ejection fraction.  Radiopharmaceutical: Tc-50m tetrofosmin, 30 mCi at rest and 30 mCi at stress.  Comparison:  08/19/2008  MYOCARDIAL IMAGING WITH SPECT (REST AND STRESS)  Findings:  Breathing motion artifact reduces diagnostic sensitivity and specificity.  Although there is some reduced activity in the anterior  wall on both stress and rest images, partially attributable to breast attenuation and motion artifact, the degree of reduction is greater on the stress images by about 15%, raising suspicion for inducible ischemia in the anterior wall of the mid heart potentially extending into the anteroseptal and anterolateral segments.  LEFT VENTRICULAR EJECTION FRACTION  Findings:  Left ventricular end-diastolic volume is 100 cc.  End- systolic volume is 38 cc.  Derived LV ejection fraction is 62%.  GATED LEFT VENTRICULAR WALL MOTION STUDY  Findings:  Left ventricular wall motion and wall thickening appear normal.  IMPRESSION:  1.  Reduced activity in the anterior wall of the left ventricle and adjacent anterolateral and anteroseptal segments is partially attributable to breast attenuation and motion artifact, but the degree of reduced activity is greater on stress images compared to rest images, raising suspicion for mild inducible ischemia. Admittedly the motion artifact does comes cause some reduction in positive predictive value.  Careful correlation with the ECG portion of the exam is recommended.   Original Report Authenticated By: Gaylyn Rong, M.D.    Dg Chest Port 1 View  12/30/2011  *RADIOLOGY REPORT*  Clinical Data: 3-day history of chest pain.  PORTABLE CHEST - 1 VIEW 12/30/2011 2228 hours:  Comparison: Two-view chest x-ray 12/09/2010, 08/10/2010, 09/29/2009.  Findings: Suboptimal inspiration due to body habitus which accounts for crowded bronchovascular markings at the bases and accentuates the cardiac silhouette.  Taking this into account, cardiac silhouette upper normal in size to slightly enlarged but stable. Lungs clear.  No visible pleural effusions.  IMPRESSION: Suboptimal inspiration.  No acute cardiopulmonary disease.  Stable borderline to mild cardiomegaly.   Original Report Authenticated By: Hulan Saas, M.D.     2D Echo:  01/01/12 Study Conclusions  - Left ventricle: The cavity size was  normal. Wall thickness was increased in a pattern of mild LVH. Systolic function was normal. The estimated ejection fraction was in the range of 55% to 60%. Wall motion was normal; there were no regional wall motion abnormalities. - Aortic valve: Mild regurgitation. - Mitral valve: Mild regurgitation. - Atrial septum: No defect or patent foramen ovale was identified.   Admission HPI:  Ms. Luebke is a 61 year old African American female with PMH of HTN, gout, obesity, hyperlipidemia, gout, DJD, OSA, CAD s/p non Q wave MIx2, CKD Stage III, chronic alcohol abuse, and asthma presenting to the ED today after her grand-daughter called EMS for complaints of chest pain. Ms. Strum claims she was at her birthday get together today when she started experiencing another episode of sharp substernal chest pain, 7/10, radiating down left arm. She claims she has had three similar episodes of chest pain this month,  each time relieved with nitroglycerin. The most recent episode was two days prior for which she took 3 nitroglycerin tablets and her chest pain resolved. She also endorses chronic lower back pain and shortness of breath upon walking short distances. She already walks with a cane and occasionally uses a wheelchair secondary to knee pain and weakness, but says now even going to the elevator makes her short of breath. Currently, her chest pain has resolved and she denies any shortness of breath, headaches, fever, chills, N/V/D, abdominal pain, leg swelling, or dysuria at this time. She does admit to having 2 beers and 1 shot of liquor today.  Of note, Ms. Chavers claims her cardiologist is Dr. Sharyn Lull who she planned to call due to the increasing episodes of chest pain, however, today the pain was severe that she came to the hospital. She was she was last seen in [facility] July 2013 for a productive cough and runny nose.   Hospital Course by problem list:  Unstable Angina. Etiology of chest pain likely  unstable angina given her history of CAD with non Q wave MI x2, shortness of breath and chest pain with exertion and now chest pain occurrence at rest. She has been having increased frequency of episodes lately, approximately 3 this month. TIMI score: 2--8% 14-day risk of death, recurrent MI or urgent revascularization. NSTEMI unlikely given no EKG changes and negative troponin x2. Musculoskeletal pain was included in the differential however, given her history of CAD, frequent episodes, lack of reproducibility, and radiation to left arm, it was thought to be less likely. Additionally, PE was considered due to complaints of chest pain and shortness of breath, however, her Geneva score was 0. Her CXR showed no acute cardiopulmonary disease making pneumonia or pneumothorax unlikely. She does have extensive cardiac history and work up with cardiac catheterization in 2004 showing good LV systolic function, EF 55-60%, left circumflex 10-20% proximal and mid stenosis, OM-1 very small. Cath 2003: small vessel diffuse OM-1 disease and diffuse RCA disease.  During this hospitalization she had EKG with no new changes, and negative cardiac enzymes. She was however, started on a heparin drip, with continuation of statin, ASA, ARB, as well as nitroglycerin PRN. Her chest pain resolved. She was seen by Dr. Sharyn Lull and a 2D echo, and a nuclear stress test were ordered. Her 2D echo showed EF of 55% to 60% with no wall motion abnormalities. Her nuclear stress test showed reduced activity in the anterior wall of the left ventricle and adjacent anterolateral and anteroseptal segments raising suspicion for mild inducible ischemia but this could be due to motion artifact. Per Dr. Sharyn Lull, patient is clinically stable and will not undergo cardiac catheterization, she needs medical management with better BP control and life-style modification (decreased EtOH consumption). He will see her as outpatient one week after her discharge.     ALCOHOL ABUSE--She has chronic alcohol abuse history. She was intoxicated in ED, after drinking 2 beers and shots of liquor on the day of her admission. She was placed on CIWA protocol and had no withdrawal seizures. She was advised to decrease her alcohol intake. She will benefit from continued outpatient counseling for cessation of alcohol abuse.  - HTN-Her home regimen is Cozaar and Lopressor. We continued her home regimen. Her BP was elevated to 186/102 once but in the context of pain. Her BP normalized with improvement of her pain. She will need outpatient follow up for better BP control.   CKD stage III--Cr. 1.44 on  admission, difficult to determine baseline creatinine range (2.49-1.34 over one year). GFR 45. Her creatinine trended down to 1.39 prior to her discharge. She will need outpatient follow up for this problem; this could be secondary to uncontrolled hypertension.   Hyperlipidemia--LDL 98 04/2011. Not on statin therapy upon her admission. She was started on statin during this admission, her goal LDL is <70. She will need outpatient follow up for continued statin therapy and LDL monitoring.   Chronic back pain--MRI lumbar spine 2010: Broad-based disc bulging and left paracentral protrusion results in left lateral recess narrowing, potentially effecting the left L5 nerve root. Minimal disc bulging into the inferior recess at L4-5 bilaterally, left worse than right without significant stenosis. Shallow central disc protrusion and facet hypertrophy at L5-S1 without focal stenosis. She had good pain control with tramadol PRN. She will need outpatient follow up for this chronic problem.   Urinary Incontinence-Urgency incontinence. She wears adult diaper at home. She reports feeling urge to void but unable to reach the bathroom in time. We ontinued Toviaz. She will need outpatient follow up and monitoring--it is unclear if this medication has helped to improve her symptoms.    Acid Reflux:  She was on PPI at home, we gave her Protonix during this hospitalization.    OSA--She is not on CPAP at home currently. She will need repeat sleep study as outpatient.  Major Depression--This was stable during this hospitalization. She denies suicidal or homicidal ideation. We continued her home dose Wellbutrin.      Discharge Vitals:  BP 148/93  Pulse 58  Temp 98.2 F (36.8 C) (Oral)  Resp 14  Ht 5\' 2"  (1.575 m)  Wt 252 lb 10.4 oz (114.6 kg)  BMI 46.21 kg/m2  SpO2 97%  Discharge Labs:  Basic Metabolic Panel:   Lab  12/31/11 0839  12/31/11 0206  12/30/11 2150   NA  141  --  137   K  4.4  --  3.6   CL  108  --  106   CO2  25  --  22   GLUCOSE  86  --  100*   BUN  21  --  25*   CREATININE  1.39*  --  1.44*   CALCIUM  8.5  --  8.8   MG  --  1.7  --   PHOS  --  --  --    Liver Function Tests:   Lab  12/31/11 0206   AST  19   ALT  11   ALKPHOS  148*   BILITOT  0.3   PROT  7.5   ALBUMIN  3.4*    CBC:   Lab  01/01/12 0530  12/31/11 0839   WBC  4.4  4.4   NEUTROABS  --  --   HGB  12.0  12.4   HCT  34.6*  35.7*   MCV  88.7  88.4   PLT  167  189    Cardiac Enzymes:   Lab  12/31/11 0839  12/31/11 0016  12/30/11 2253   CKTOTAL  --  --  --   CKMB  --  --  --   CKMBINDEX  --  --  --   TROPONINI  <0.30  <0.30  <0.30    Hemoglobin A1C:   Lab  12/31/11 0206   HGBA1C  5.8*    Thyroid Function Tests:   Lab  12/31/11 0206   TSH  1.207   T4TOTAL  --  FREET4  --   T3FREE  --   THYROIDAB  --    Coagulation:   Lab  12/31/11 0839   LABPROT  15.0   INR  1.20    Urine Drug Screen:  Drugs of Abuse    Component  Value  Date/Time    LABOPIA  NONE DETECTED  12/30/2011 2221    LABOPIA  NEGATIVE  08/10/2010 1710    COCAINSCRNUR  NONE DETECTED  12/30/2011 2221    COCAINSCRNUR  NEGATIVE  08/10/2010 1710    LABBENZ  NONE DETECTED  12/30/2011 2221    LABBENZ  POSITIVE*  08/10/2010 1710    AMPHETMU  NONE DETECTED  12/30/2011 2221    AMPHETMU  NEGATIVE  08/10/2010 1710     THCU  NONE DETECTED  12/30/2011 2221    LABBARB  NONE DETECTED  12/30/2011 2221    Alcohol Level:   Lab  12/30/11 2254   ETH  192*    Urinalysis:   Lab  12/30/11 2221   COLORURINE  STRAW*   LABSPEC  1.005   PHURINE  6.0   GLUCOSEU  NEGATIVE   HGBUR  NEGATIVE   BILIRUBINUR  NEGATIVE   KETONESUR  NEGATIVE   PROTEINUR  NEGATIVE   UROBILINOGEN  0.2   NITRITE  NEGATIVE   LEUKOCYTESUR  NEGATIVE     Signed: Ky Barban 01/12/2012, 9:02 PM   Time Spent on Discharge: 40 minutes Services Ordered on Discharge: None Equipment Ordered on Discharge: None

## 2012-01-14 ENCOUNTER — Ambulatory Visit (INDEPENDENT_AMBULATORY_CARE_PROVIDER_SITE_OTHER): Payer: Medicare Other | Admitting: Internal Medicine

## 2012-01-14 ENCOUNTER — Encounter: Payer: Self-pay | Admitting: Internal Medicine

## 2012-01-14 VITALS — BP 141/84 | HR 55 | Temp 97.4°F | Ht 62.0 in | Wt 258.0 lb

## 2012-01-14 DIAGNOSIS — M2550 Pain in unspecified joint: Secondary | ICD-10-CM | POA: Diagnosis not present

## 2012-01-14 DIAGNOSIS — Z0000 Encounter for general adult medical examination without abnormal findings: Secondary | ICD-10-CM | POA: Insufficient documentation

## 2012-01-14 DIAGNOSIS — E785 Hyperlipidemia, unspecified: Secondary | ICD-10-CM

## 2012-01-14 DIAGNOSIS — I2510 Atherosclerotic heart disease of native coronary artery without angina pectoris: Secondary | ICD-10-CM

## 2012-01-14 DIAGNOSIS — I10 Essential (primary) hypertension: Secondary | ICD-10-CM

## 2012-01-14 DIAGNOSIS — Z139 Encounter for screening, unspecified: Secondary | ICD-10-CM | POA: Insufficient documentation

## 2012-01-14 DIAGNOSIS — M1990 Unspecified osteoarthritis, unspecified site: Secondary | ICD-10-CM | POA: Diagnosis not present

## 2012-01-14 LAB — LIPID PANEL
Cholesterol: 119 mg/dL (ref 0–200)
HDL: 41 mg/dL (ref >39)
Total CHOL/HDL Ratio: 2.9 Ratio
Triglycerides: 111 mg/dL (ref <150)
VLDL: 22 mg/dL (ref 0–40)

## 2012-01-14 MED ORDER — OXYCODONE-ACETAMINOPHEN 5-325 MG PO TABS: 1.0000 | ORAL_TABLET | Freq: Three times a day (TID) | ORAL | Status: DC | PRN

## 2012-01-14 MED ORDER — HYDROXYZINE HCL 10 MG PO TABS: 10.0000 mg | ORAL_TABLET | Freq: Three times a day (TID) | ORAL | Status: DC | PRN

## 2012-01-14 MED ORDER — METOPROLOL TARTRATE 50 MG PO TABS: 100.0000 mg | ORAL_TABLET | Freq: Two times a day (BID) | ORAL | Status: DC

## 2012-01-14 MED ORDER — LOSARTAN POTASSIUM 100 MG PO TABS: 100.0000 mg | ORAL_TABLET | Freq: Two times a day (BID) | ORAL | Status: DC

## 2012-01-14 NOTE — Assessment & Plan Note (Signed)
D/C atorvastatin due to nausea  Recheck Lipid profile today, her goal will likely be LDL < 100 due to CAD and HTN

## 2012-01-14 NOTE — Assessment & Plan Note (Signed)
Currently chest pain free.   Continue losartan, metoprolol, NTG.  No statin at this point due to nausea, checking lipid panel.  Consider pravastatin if needed.

## 2012-01-14 NOTE — Assessment & Plan Note (Signed)
This is the main issues [name] wanted to discuss because she is requesting disability paperwork.  It is unclear by my review of the last year of notes what she is on disability for, thought she has been evaluated for chronic back pain, knee pain in many of the visits and requested paperwork for knee braces and a TENs unit.  She is likely on disability for back pain.  However, she is unable to provide a clear history of onset and work up that has progressed, and it is not clear exactly what was attempted and in what time frame all of this started. I have requested that she go through her own records and report back to me on Wednesday.
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
## 2012-01-14 NOTE — Assessment & Plan Note (Addendum)
Recheck BP was 140/90, which is still above goal, will recheck X 1 in my clinic and add a medication if needed.  The tricky issue is that she has chronic kidney disease, so HCTZ might not be the best choice and spironolactone will put her at risk for hyperkalemia.  Will recheck BP at next visit along with CMP and possibly evaluate for hyperaldosteronism as this will lead me to choose Spironolactone over HCTZ.   Continue other medications, refills provided.

## 2012-01-14 NOTE — Assessment & Plan Note (Signed)
>>  ASSESSMENT AND PLAN FOR CORONARY ATHEROSCLEROSIS WRITTEN ON 01/14/2012  1:40 PM BY Kahari Critzer B, MD  Currently chest pain free.   Continue losartan , metoprolol , NTG.  No statin at this point due to nausea, checking lipid panel.  Consider pravastatin if needed.

## 2012-01-14 NOTE — Assessment & Plan Note (Signed)
Offered flu shot today, declined.   Other issues deferred to future visits.

## 2012-01-14 NOTE — Patient Instructions (Addendum)
For your blood pressure -   Please check your blood pressure at least once a week.  If you have a blood pressure cuff at home, check it daily in the morning before having any breakfast.  Please keep a log of what your blood pressures are when you check them.  Please bring this log with you to each clinic visit.    Please schedule a follow up visit within the next 4 weeks to follow up on your blood pressure and pain control.   For your medications:   Please bring all of your pill  Bottles with you to each visit.  This will help make sure that we have an up to date list of all the medications you are taking.  Please also bring any over the counter herbal medications you are taking (not including advil, tylenol, etc.)   Please stop taking lipitor (atorvastatin)  Please start taking percocet instead of morphine.  Please remember that you signed a pain contract with [name] and that you should not receive pain medications from any other clinic or doctor.    Thank you!

## 2012-02-11 ENCOUNTER — Encounter: Payer: Self-pay | Admitting: Internal Medicine

## 2012-02-11 ENCOUNTER — Ambulatory Visit (INDEPENDENT_AMBULATORY_CARE_PROVIDER_SITE_OTHER): Payer: Medicare Other | Admitting: Internal Medicine

## 2012-02-11 VITALS — BP 125/80 | HR 61 | Temp 96.8°F | Ht 62.0 in | Wt 262.6 lb

## 2012-02-11 DIAGNOSIS — K219 Gastro-esophageal reflux disease without esophagitis: Secondary | ICD-10-CM | POA: Diagnosis not present

## 2012-02-11 DIAGNOSIS — E785 Hyperlipidemia, unspecified: Secondary | ICD-10-CM | POA: Diagnosis not present

## 2012-02-11 DIAGNOSIS — I10 Essential (primary) hypertension: Secondary | ICD-10-CM | POA: Diagnosis not present

## 2012-02-11 DIAGNOSIS — M2550 Pain in unspecified joint: Secondary | ICD-10-CM

## 2012-02-11 DIAGNOSIS — K3189 Other diseases of stomach and duodenum: Secondary | ICD-10-CM | POA: Diagnosis not present

## 2012-02-11 DIAGNOSIS — Z0000 Encounter for general adult medical examination without abnormal findings: Secondary | ICD-10-CM | POA: Diagnosis not present

## 2012-02-11 DIAGNOSIS — M1990 Unspecified osteoarthritis, unspecified site: Secondary | ICD-10-CM

## 2012-02-11 DIAGNOSIS — R748 Abnormal levels of other serum enzymes: Secondary | ICD-10-CM

## 2012-02-11 DIAGNOSIS — R1013 Epigastric pain: Secondary | ICD-10-CM

## 2012-02-11 DIAGNOSIS — N183 Chronic kidney disease, stage 3 unspecified: Secondary | ICD-10-CM | POA: Diagnosis not present

## 2012-02-11 MED ORDER — OMEPRAZOLE 40 MG PO CPDR: 40.0000 mg | DELAYED_RELEASE_CAPSULE | Freq: Every day | ORAL | Status: DC

## 2012-02-11 MED ORDER — ONDANSETRON HCL 4 MG/5ML PO SOLN: 4.0000 mg | Freq: Two times a day (BID) | ORAL | Status: DC | PRN

## 2012-02-11 MED ORDER — OXYCODONE-ACETAMINOPHEN 5-325 MG PO TABS: 1.0000 | ORAL_TABLET | Freq: Three times a day (TID) | ORAL | Status: DC | PRN

## 2012-02-11 NOTE — Addendum Note (Signed)
Addended by: [name] B on: 02/11/2012 02:27 PM   Modules accepted: Orders

## 2012-02-11 NOTE — Assessment & Plan Note (Signed)
Ms. Hegwood complained of an unsettled feeling in her stomach at her visit today.  She has a history of GERD, and as her symptoms occur after meals, increased acid secretion may be the culprit.  She has no idea what type of acid reducer she is on (on H2 blocker and PPI per med list).  Will simplify her med list and give her omeprazole today along with zofran for symptom control.  If this helps, GERD was likely the culprit.  If no change, will discuss further at next visit.  Have requested a CMP to evaluate for uremia, worsening of her kidney disease, but it does not appear she went to the lab.

## 2012-02-11 NOTE — Patient Instructions (Addendum)
Please schedule a follow up visit within the next 4-6 months.   For your medications:   Please bring all of your pill  Bottles with you to each visit.  This will help make sure that we have an up to date list of all the medications you are taking.  Please also bring any over the counter herbal medications you are taking (not including advil, tylenol, etc.)  Please continue taking amlodipine, losartan, furosemide, aspirin, bupropion, Flonase, hydroxyzine, metoprolol, oxycodone-acetaminophen, toviaz  Please stop taking Famotidine (Pepcid)  You will have a new prescription for Omeprazole 40 mg by mouth daily in the morning, preferably 30 minutes before you eat anything.  Please give the clinic a call if your symptoms of nausea and stomach upset do not improve with this medication in the next 2-3 weeks.   You will also be given a prescription for a nausea medicine called Zofran.   Your Percocet prescription will be increased to 120 pills a month, you will be signing a new pain contract.  Please remember that this contract states that you will get no pain pills (narcotics) from any other doctors or health care providers, this included the emergency department or urgent care facility.    For your blood pressure -   Please check your blood pressure at least once a week.  If you have a blood pressure cuff at home, check it daily in the morning before having any breakfast.  Please keep a log of what your blood pressures are when you check them.  Please bring this log with you to each clinic visit.     Thank you!

## 2012-02-11 NOTE — Assessment & Plan Note (Signed)
LDL was 56 at last check.  Will recheck in 6 months and evaluate need for statin.  She would likely need a statin regardless, considering history of CAD, however, due to her nausea and inability to tolerate lipitor, will hold off for now.   Recheck lipid in 6 months.

## 2012-02-11 NOTE — Assessment & Plan Note (Signed)
Refused flu vaccine.  Discuss colonoscopy at next visit.

## 2012-02-11 NOTE — Assessment & Plan Note (Signed)
BP today was 125/80, which is greatly improved from last visit.  No need to change medications at this time.   Continue amlodipine, cozaar, metoprolol, lasix.   Refills as needed.

## 2012-02-11 NOTE — Progress Notes (Signed)
Subjective:    Patient ID: Vanessa Palmer, female    DOB: September 09, 1950, 61 y.o.   MRN: [id number]  CC: re-check blood pressure  HPI  Vanessa Palmer presented today with a walker, she reports her motorized wheelchair is broken and awaiting a battery replacement.   Vanessa Palmer reports continued nausea when eating.  She usually has a dyspeptic feeling in her stomach and it feels "unsettled" very often.  She reports that all food seems to cause the symptoms and that it has been  Going on for a while.  (As noted previously, Vanessa Palmer is vague in her time qualifications, so it is difficult to assess acuity, this appears chronic.)  She reports feeling regularly like food is going to "come up" but that she only occasionally has emesis.  She reports no blood in the emesis, just food that she has recently eaten.  She is not taking NSAIDs due to her CKD. Her med list has both famotidine and omeprazole, however, Vanessa Palmer is unclear what medications she is taking.  She reports "taking whatever they give me."   Vanessa Palmer is also complaining of hip pain, which has been going on for about a week.  The pain is located in her posterior hip, worse with sitting and not associated with an accident.  She states it feels like something is "mashing" on her leg.  Further symptoms include that her hip/leg feels stiff upon standing and she has to wait a few minutes before walking to make sure she doesn't fall.  She denies weakness or numbness.  Her pain medicine has helped, as does lying still in her bed.  She notes that walking does seem to make the pain a little worse.  She has needed to walk for much further distances recently due to her motorized scooter breaking and needing a battery.  Prior to this occurring, she was almost completely sedentary.   She is currently taking her pain medication 2 pills at a time, BID; therefore, she has run out of her medication early.  She is requesting a refill.   [name]' BP is  improved today, she is currently taking (per med list), amlodipine, cozaar, metoprolol and lasix without any side effects.  She also has a diagnosis of HLD, her last LDL was 56 and we stopped lipitor at her last visit.  She further has CKD stage III, for which she is seeing her nephrologist on Wednesday of this week.    Medications: I reviewed Vanessa Palmer listed meds with her, she stated that she thinks she is taking all of them, but is unsure.  I have requested that she bring in her pill bottles at next visit.   SH: She is not a smoker, denies drug use.  Will request a UDS today, as she is on pain contract.    Review of Systems  Constitutional: Positive for activity change. Negative for fever, chills and appetite change.  HENT: Negative for hearing loss, ear pain and sore throat.   Eyes: Negative for pain and redness.  Respiratory: Negative for cough, chest tightness and shortness of breath.   Cardiovascular: Positive for palpitations. Negative for chest pain and leg swelling.  Gastrointestinal: Positive for nausea, vomiting and abdominal pain. Negative for diarrhea and constipation.  Genitourinary: Negative for dysuria and difficulty urinating.  Musculoskeletal: Positive for arthralgias and gait problem. Negative for joint swelling.  Skin: Negative for color change, pallor and rash.  Neurological: Negative for dizziness, syncope and light-headedness.  Hematological:  Negative for adenopathy.  Psychiatric/Behavioral: Negative for confusion and decreased concentration.      Objective:   Physical Exam  Constitutional: She is oriented to person, place, and time.       Obese woman, appears in normal state of health, using a walker instead of motorized wheelchair.  Nauseated during interview.   HENT:  Head: Normocephalic and atraumatic.  Mouth/Throat: No oropharyngeal exudate.  Eyes: EOM are normal. Pupils are equal, round, and reactive to light. No scleral icterus.  Cardiovascular: Normal  rate, normal heart sounds and intact distal pulses.   No murmur heard. Pulmonary/Chest: Effort normal and breath sounds normal. No respiratory distress. She has no wheezes.  Abdominal: Soft. Bowel sounds are normal. She exhibits no distension. There is no tenderness.  Musculoskeletal:       + point tenderness over piriformis  Neurological: She is alert and oriented to person, place, and time.  Skin: Skin is warm and dry. No rash noted. No erythema.  Psychiatric: She has a normal mood and affect. Her behavior is normal.   CMP and UDS pending     Assessment & Plan:  Please see problem oriented charting.   RTC in 4-6 months, earlier if needed for further nausea evaluation.

## 2012-02-11 NOTE — Assessment & Plan Note (Addendum)
Vanessa Palmer has pain in multiple sites, most notably her hip today, which is likely due to piriformis disease from recent increase in activity.  Her body habitus precludes injection in this clinic and she cannot take NSAIDs due to kidney disease.  She has some relief from her pain with narcotic pain medication; she is requesting an increased number of pills today.  She has also requested a rollater walker, however, she is unsure if her insurance will pay for it.  She may benefit from getting enrolled into [facility] for assistance with home issues and assistance with procuring equipment.  Will request consult with Lynnae January, social worker here in the clinic.   Will have Ms. Scritchfield sign new pain contract, explaining the contract to her today.   Rx for percocet 5/325 1-2 pills PO BID prn pain given today.   UDS ordered and collected today.

## 2012-02-12 LAB — PRESCRIPTION ABUSE MONITORING 15P, URINE
Amphetamine/Meth: NEGATIVE ng/mL
Benzodiazepine Screen, Urine: NEGATIVE ng/mL
Buprenorphine, Urine: NEGATIVE ng/mL
Carisoprodol, Urine: NEGATIVE ng/mL
Opiate Screen, Urine: NEGATIVE ng/mL
Oxycodone Screen, Ur: NEGATIVE ng/mL

## 2012-02-12 NOTE — Addendum Note (Signed)
Addended by: Bufford Spikes on: 02/12/2012 12:26 PM   Modules accepted: Orders

## 2012-02-13 DIAGNOSIS — I129 Hypertensive chronic kidney disease with stage 1 through stage 4 chronic kidney disease, or unspecified chronic kidney disease: Secondary | ICD-10-CM | POA: Diagnosis not present

## 2012-02-18 ENCOUNTER — Telehealth: Payer: Self-pay | Admitting: Licensed Clinical Social Worker

## 2012-02-18 ENCOUNTER — Other Ambulatory Visit: Payer: Self-pay | Admitting: Internal Medicine

## 2012-02-18 DIAGNOSIS — M2550 Pain in unspecified joint: Secondary | ICD-10-CM

## 2012-02-18 DIAGNOSIS — M549 Dorsalgia, unspecified: Secondary | ICD-10-CM

## 2012-02-18 NOTE — Telephone Encounter (Signed)
[name] was referred to CSW for DME questions and possible referral to [facility] for home issues.  CSW placed call to [facility], pt insurance will cover a rollator walker if pt has not utilized insurance for a walker in the past 5 years.  CSW placed call to Vanessa Palmer.  Pt states she does not currently have a walker and has not used insurance for a walker in the past 5 years.  Pt states she is in need of rollator walker due to her long hallways and in need of assistance during ambulation.  CSW informed pt of referral to [facility] for DME, will request Rx from PCP for rollator walker.  CSW inquired if pt was having additional issues or concerns in the home, pt denied.  CSW inquired if pt was needing assistance obtaining or taking her medication, pt denied.  Unclear of pt's [facility] needs and if pt would benefit from referral to [facility] at this time per PCP request, CSW will request [facility] liaison to refer pt 's chart for appropriateness.

## 2012-02-18 NOTE — Telephone Encounter (Signed)
Nunzio Cobbs -   Thanks for your help!  It's really unclear to me if Ms. Blayney needs help at home, as she comes with a motorized scooter (recently broken), doesn't seem to know what medicines she takes or why she takes them.  She also can't confirm any of her medical history when I talk to her about it.  It wasn't very clear to me if she has assistance at home, but I have interacted with multiple family members, so maybe [facility] isn't necessary!  Thanks for helping with the rollator walker, do I just need to print an Rx and bring it to you?   EBM

## 2012-02-27 NOTE — Assessment & Plan Note (Signed)
Her weight is adding to the problems with her back pain and knee pain significantly.  We discussed today that increased activity as well as dietary changes will help her knees and back immensely.  She was encouraged to keep a food diary to identify where she can make changes to her diet to help her lose weight.  We briefly discussed bariatric surgery but she will need to prove that she can lose weight prior to consideration for surgery.

## 2012-02-27 NOTE — Assessment & Plan Note (Addendum)
She presents with back pain that she states is in her lower back on both sides of the spine.  The pain is a 10/10 that is worse with prolonged standing and walking as well as any movements.  She denies trauma, numbness, tingling, falls, urinary incontinence, or stool incontinence.  She has tried several medications but has problems with percocet, Vicodin, and dilaudid.  She has a history of CKD stage III so she is not a candidate for NSAID therapy.  See PE for details of ROM, strength and sensation.  She would benefit from PT but is resistant to the concept because she states "I only hurt more, it doesn't help me."  We discussed several options for treatment and we will start with some focused PT for strength and flexibility.  With there intolerances to other opiate medications we will start morphine 15 mg PO 1/2 to 1 tablet every 6 hours as needed for the pain.  She was also encouraged to use tylenol, no more then 3 g daily, for her pain as well.  When she presents for her follow up if the medication is helping we will start her on the opiate contract.

## 2012-02-27 NOTE — Assessment & Plan Note (Signed)
11/05/11:  She has pain in both of her knees which she states limits her activity.  She states that her legs feel "weak" and that they "give out" on her while she is walking.  She has had a previous arthroscopy on her left knee by Dr. Allie Bossier which she states didn't really help her pain.  She was seen in the ED in August and had plain films done which showed tricompartmental disease.  She has been told she likely would need a replacement in the future.  The pain is more focused over the anterior portion of the knee as well as the medial and lateral compartments.  The pain is worse with walking and prolonged standing.  We discussed pain control as well as continued work on strength and flexibility.  The morphine that we are trying for the back will also help cover what is going here.  She was encouraged to follow up with Dr. [name] to see if and when he would consider knee replacement.  I suspect that she will need to lose significant weight before that will be a possibility.

## 2012-02-27 NOTE — Assessment & Plan Note (Signed)
11/05/11:  She is due for a refill of her Losartan today since she ran out several days ago.  She has been taking the metoprolol.    BP Readings from Last 2 Encounters:  11/05/11 159/98  10/03/11 162/80   Her blood pressure today is elevated likely from being out of her medication.  I encouraged her to make sure she is taking the medication as prescribed and if she runs out to make sure she contacts her pharmacy so they can send a refill request to [name] if needed.

## 2012-02-29 ENCOUNTER — Other Ambulatory Visit: Payer: Self-pay | Admitting: Internal Medicine

## 2012-02-29 ENCOUNTER — Other Ambulatory Visit (HOSPITAL_COMMUNITY): Payer: Self-pay | Admitting: Cardiology

## 2012-02-29 DIAGNOSIS — Z1231 Encounter for screening mammogram for malignant neoplasm of breast: Secondary | ICD-10-CM

## 2012-03-05 ENCOUNTER — Other Ambulatory Visit: Payer: Self-pay | Admitting: *Deleted

## 2012-03-05 DIAGNOSIS — M1990 Unspecified osteoarthritis, unspecified site: Secondary | ICD-10-CM

## 2012-03-07 MED ORDER — BUPROPION HCL ER (XL) 150 MG PO TB24: 150.0000 mg | ORAL_TABLET | ORAL | Status: DC

## 2012-03-07 MED ORDER — HYDROXYZINE HCL 10 MG PO TABS: 10.0000 mg | ORAL_TABLET | Freq: Three times a day (TID) | ORAL | Status: DC | PRN

## 2012-03-11 NOTE — Telephone Encounter (Signed)
Yes.  If you want to place an order for the rollator, if can refer it to [facility].

## 2012-03-12 ENCOUNTER — Ambulatory Visit (HOSPITAL_COMMUNITY): Payer: Medicare Other

## 2012-03-12 ENCOUNTER — Other Ambulatory Visit: Payer: Self-pay | Admitting: *Deleted

## 2012-03-12 DIAGNOSIS — M1990 Unspecified osteoarthritis, unspecified site: Secondary | ICD-10-CM

## 2012-03-12 NOTE — Telephone Encounter (Signed)
Last filled 12/16 Call pt when ready @ [phone]

## 2012-03-13 MED ORDER — OXYCODONE-ACETAMINOPHEN 5-325 MG PO TABS: 1.0000 | ORAL_TABLET | Freq: Three times a day (TID) | ORAL | Status: DC | PRN

## 2012-03-13 NOTE — Telephone Encounter (Signed)
Pt informed Rx is ready 

## 2012-03-21 ENCOUNTER — Ambulatory Visit (HOSPITAL_COMMUNITY)
Admission: RE | Admit: 2012-03-21 | Discharge: 2012-03-21 | Disposition: A | Discharge status: Home / Self Care | Payer: Medicare Other | Source: Ambulatory Visit | Attending: Cardiology | Admitting: Cardiology

## 2012-03-21 DIAGNOSIS — Z1231 Encounter for screening mammogram for malignant neoplasm of breast: Secondary | ICD-10-CM | POA: Diagnosis not present

## 2012-03-25 ENCOUNTER — Telehealth: Payer: Self-pay | Admitting: *Deleted

## 2012-03-25 NOTE — Telephone Encounter (Signed)
Message left from Eldon at [facility] to callher about this pt.; returned her call, no answer,message left.

## 2012-03-26 ENCOUNTER — Other Ambulatory Visit (HOSPITAL_COMMUNITY): Payer: Self-pay | Admitting: Internal Medicine

## 2012-04-03 ENCOUNTER — Emergency Department (HOSPITAL_COMMUNITY): Payer: Medicare Other

## 2012-04-03 ENCOUNTER — Encounter (HOSPITAL_COMMUNITY): Payer: Self-pay | Admitting: Nurse Practitioner

## 2012-04-03 ENCOUNTER — Inpatient Hospital Stay (HOSPITAL_COMMUNITY)
Admission: EM | Admit: 2012-04-03 | Discharge: 2012-04-07 | DRG: 300 | Disposition: A | Discharge status: Home Health | Payer: Medicare Other | Attending: Infectious Disease | Admitting: Infectious Disease

## 2012-04-03 DIAGNOSIS — K219 Gastro-esophageal reflux disease without esophagitis: Secondary | ICD-10-CM | POA: Diagnosis present

## 2012-04-03 DIAGNOSIS — R0789 Other chest pain: Secondary | ICD-10-CM | POA: Diagnosis not present

## 2012-04-03 DIAGNOSIS — M1990 Unspecified osteoarthritis, unspecified site: Secondary | ICD-10-CM

## 2012-04-03 DIAGNOSIS — Z6841 Body Mass Index (BMI) 40.0 and over, adult: Secondary | ICD-10-CM

## 2012-04-03 DIAGNOSIS — R079 Chest pain, unspecified: Secondary | ICD-10-CM | POA: Diagnosis not present

## 2012-04-03 DIAGNOSIS — G8929 Other chronic pain: Secondary | ICD-10-CM | POA: Diagnosis present

## 2012-04-03 DIAGNOSIS — F3289 Other specified depressive episodes: Secondary | ICD-10-CM | POA: Diagnosis present

## 2012-04-03 DIAGNOSIS — I82409 Acute embolism and thrombosis of unspecified deep veins of unspecified lower extremity: Principal | ICD-10-CM | POA: Diagnosis present

## 2012-04-03 DIAGNOSIS — I129 Hypertensive chronic kidney disease with stage 1 through stage 4 chronic kidney disease, or unspecified chronic kidney disease: Secondary | ICD-10-CM | POA: Diagnosis not present

## 2012-04-03 DIAGNOSIS — M109 Gout, unspecified: Secondary | ICD-10-CM | POA: Diagnosis present

## 2012-04-03 DIAGNOSIS — I82403 Acute embolism and thrombosis of unspecified deep veins of lower extremity, bilateral: Secondary | ICD-10-CM

## 2012-04-03 DIAGNOSIS — G4733 Obstructive sleep apnea (adult) (pediatric): Secondary | ICD-10-CM | POA: Diagnosis present

## 2012-04-03 DIAGNOSIS — R109 Unspecified abdominal pain: Secondary | ICD-10-CM

## 2012-04-03 DIAGNOSIS — M2550 Pain in unspecified joint: Secondary | ICD-10-CM

## 2012-04-03 DIAGNOSIS — R1013 Epigastric pain: Secondary | ICD-10-CM

## 2012-04-03 DIAGNOSIS — I252 Old myocardial infarction: Secondary | ICD-10-CM

## 2012-04-03 DIAGNOSIS — R0989 Other specified symptoms and signs involving the circulatory and respiratory systems: Secondary | ICD-10-CM | POA: Diagnosis not present

## 2012-04-03 DIAGNOSIS — I10 Essential (primary) hypertension: Secondary | ICD-10-CM

## 2012-04-03 DIAGNOSIS — J309 Allergic rhinitis, unspecified: Secondary | ICD-10-CM

## 2012-04-03 DIAGNOSIS — F1020 Alcohol dependence, uncomplicated: Secondary | ICD-10-CM | POA: Diagnosis present

## 2012-04-03 DIAGNOSIS — N183 Chronic kidney disease, stage 3 unspecified: Secondary | ICD-10-CM | POA: Diagnosis present

## 2012-04-03 DIAGNOSIS — M549 Dorsalgia, unspecified: Secondary | ICD-10-CM | POA: Diagnosis present

## 2012-04-03 DIAGNOSIS — F329 Major depressive disorder, single episode, unspecified: Secondary | ICD-10-CM | POA: Diagnosis present

## 2012-04-03 DIAGNOSIS — E669 Obesity, unspecified: Secondary | ICD-10-CM | POA: Diagnosis present

## 2012-04-03 DIAGNOSIS — E785 Hyperlipidemia, unspecified: Secondary | ICD-10-CM | POA: Diagnosis present

## 2012-04-03 DIAGNOSIS — F1011 Alcohol abuse, in remission: Secondary | ICD-10-CM | POA: Diagnosis present

## 2012-04-03 DIAGNOSIS — F1010 Alcohol abuse, uncomplicated: Secondary | ICD-10-CM

## 2012-04-03 DIAGNOSIS — J45901 Unspecified asthma with (acute) exacerbation: Secondary | ICD-10-CM

## 2012-04-03 DIAGNOSIS — N3941 Urge incontinence: Secondary | ICD-10-CM | POA: Diagnosis present

## 2012-04-03 DIAGNOSIS — I2510 Atherosclerotic heart disease of native coronary artery without angina pectoris: Secondary | ICD-10-CM | POA: Diagnosis present

## 2012-04-03 HISTORY — DX: Heart failure, unspecified: I50.9

## 2012-04-03 LAB — D-DIMER, QUANTITATIVE: D-Dimer, Quant: 0.3 ug/mL-FEU (ref 0.00–0.48)

## 2012-04-03 LAB — CBC
HCT: 33.7 % — ABNORMAL LOW (ref 36.0–46.0)
Hemoglobin: 11.5 g/dL — ABNORMAL LOW (ref 12.0–15.0)
MCH: 31.1 pg (ref 26.0–34.0)
MCH: 30.5 pg (ref 26.0–34.0)
MCHC: 34.5 g/dL (ref 30.0–36.0)
MCV: 89.4 fL (ref 78.0–100.0)
Platelets: 183 10*3/uL (ref 150–400)
Platelets: 189 10*3/uL (ref 150–400)
RBC: 3.77 MIL/uL — ABNORMAL LOW (ref 3.87–5.11)
RDW: 13.6 % (ref 11.5–15.5)
WBC: 5 10*3/uL (ref 4.0–10.5)

## 2012-04-03 LAB — POCT I-STAT TROPONIN I: Troponin i, poc: 0 ng/mL (ref 0.00–0.08)

## 2012-04-03 LAB — CREATININE, SERUM
Creatinine, Ser: 1.48 mg/dL — ABNORMAL HIGH (ref 0.50–1.10)
GFR calc Af Amer: 44 mL/min — ABNORMAL LOW
GFR calc non Af Amer: 38 mL/min — ABNORMAL LOW

## 2012-04-03 LAB — PRO B NATRIURETIC PEPTIDE: Pro B Natriuretic peptide (BNP): 239.2 pg/mL — ABNORMAL HIGH (ref 0–125)

## 2012-04-03 LAB — COMPREHENSIVE METABOLIC PANEL
ALT: 10 U/L (ref 0–35)
Calcium: 8.7 mg/dL (ref 8.4–10.5)
GFR calc Af Amer: 44 mL/min — ABNORMAL LOW (ref >90)
Glucose, Bld: 101 mg/dL — ABNORMAL HIGH (ref 70–99)
Sodium: 141 mEq/L (ref 135–145)
Total Protein: 7.4 g/dL (ref 6.0–8.3)

## 2012-04-03 LAB — APTT: aPTT: 33 seconds (ref 24–37)

## 2012-04-03 MED ORDER — SODIUM CHLORIDE 0.9 % IV SOLN
1000.0000 mL | INTRAVENOUS | Status: DC
  Administered 2012-04-03: 1000 mL via INTRAVENOUS

## 2012-04-03 MED ORDER — FUROSEMIDE 10 MG/ML IJ SOLN
40.0000 mg | Freq: Every day | INTRAMUSCULAR | Status: DC
  Administered 2012-04-04: 40 mg via INTRAVENOUS
  Filled 2012-04-03: qty 4

## 2012-04-03 MED ORDER — HYDROCODONE-ACETAMINOPHEN 5-325 MG PO TABS
1.0000 | ORAL_TABLET | Freq: Once | ORAL | Status: AC
  Administered 2012-04-03: 1 via ORAL
  Filled 2012-04-03: qty 1

## 2012-04-03 MED ORDER — LOSARTAN POTASSIUM 50 MG PO TABS
100.0000 mg | ORAL_TABLET | Freq: Two times a day (BID) | ORAL | Status: DC
  Administered 2012-04-04 – 2012-04-07 (×7): 100 mg via ORAL
  Filled 2012-04-03 (×9): qty 2

## 2012-04-03 MED ORDER — LORAZEPAM 1 MG PO TABS: 1.0000 mg | ORAL_TABLET | Freq: Four times a day (QID) | ORAL | Status: AC | PRN

## 2012-04-03 MED ORDER — SODIUM CHLORIDE 0.9 % IJ SOLN: 3.0000 mL | INTRAMUSCULAR | Status: DC | PRN

## 2012-04-03 MED ORDER — THIAMINE HCL 100 MG/ML IJ SOLN
100.0000 mg | Freq: Every day | INTRAMUSCULAR | Status: DC
  Filled 2012-04-03 (×3): qty 1

## 2012-04-03 MED ORDER — FOLIC ACID 1 MG PO TABS
1.0000 mg | ORAL_TABLET | Freq: Every day | ORAL | Status: DC
  Administered 2012-04-04 – 2012-04-07 (×4): 1 mg via ORAL
  Filled 2012-04-03 (×4): qty 1

## 2012-04-03 MED ORDER — AMLODIPINE BESYLATE 10 MG PO TABS
10.0000 mg | ORAL_TABLET | Freq: Every day | ORAL | Status: DC
  Administered 2012-04-04 – 2012-04-07 (×4): 10 mg via ORAL
  Filled 2012-04-03 (×4): qty 1

## 2012-04-03 MED ORDER — NITROGLYCERIN 0.4 MG SL SUBL: 0.4000 mg | SUBLINGUAL_TABLET | SUBLINGUAL | Status: DC | PRN

## 2012-04-03 MED ORDER — OXYCODONE-ACETAMINOPHEN 5-325 MG PO TABS
1.0000 | ORAL_TABLET | Freq: Three times a day (TID) | ORAL | Status: DC | PRN
  Administered 2012-04-04 – 2012-04-07 (×8): 1 via ORAL
  Filled 2012-04-03 (×8): qty 1

## 2012-04-03 MED ORDER — PANTOPRAZOLE SODIUM 40 MG PO TBEC
40.0000 mg | DELAYED_RELEASE_TABLET | Freq: Every day | ORAL | Status: DC
  Administered 2012-04-04 – 2012-04-07 (×4): 40 mg via ORAL
  Filled 2012-04-03 (×4): qty 1

## 2012-04-03 MED ORDER — ACETAMINOPHEN 325 MG PO TABS: 650.0000 mg | ORAL_TABLET | Freq: Four times a day (QID) | ORAL | Status: DC | PRN

## 2012-04-03 MED ORDER — LOSARTAN POTASSIUM 50 MG PO TABS: 100.0000 mg | ORAL_TABLET | Freq: Two times a day (BID) | ORAL | Status: DC

## 2012-04-03 MED ORDER — ADULT MULTIVITAMIN W/MINERALS CH
1.0000 | ORAL_TABLET | Freq: Every day | ORAL | Status: DC
  Administered 2012-04-04 – 2012-04-07 (×4): 1 via ORAL
  Filled 2012-04-03 (×4): qty 1

## 2012-04-03 MED ORDER — METOPROLOL TARTRATE 100 MG PO TABS
100.0000 mg | ORAL_TABLET | Freq: Two times a day (BID) | ORAL | Status: DC
  Administered 2012-04-04 – 2012-04-07 (×7): 100 mg via ORAL
  Filled 2012-04-03 (×9): qty 1

## 2012-04-03 MED ORDER — FESOTERODINE FUMARATE ER 8 MG PO TB24
8.0000 mg | ORAL_TABLET | Freq: Every day | ORAL | Status: DC
  Administered 2012-04-04 – 2012-04-07 (×4): 8 mg via ORAL
  Filled 2012-04-03 (×4): qty 1

## 2012-04-03 MED ORDER — HEPARIN SODIUM (PORCINE) 5000 UNIT/ML IJ SOLN
5000.0000 [IU] | Freq: Three times a day (TID) | INTRAMUSCULAR | Status: DC
  Administered 2012-04-04: 5000 [IU] via SUBCUTANEOUS
  Filled 2012-04-03 (×4): qty 1

## 2012-04-03 MED ORDER — ONDANSETRON HCL 4 MG PO TABS: 4.0000 mg | ORAL_TABLET | Freq: Two times a day (BID) | ORAL | Status: DC | PRN

## 2012-04-03 MED ORDER — FUROSEMIDE 10 MG/ML IJ SOLN
40.0000 mg | Freq: Once | INTRAMUSCULAR | Status: AC
  Administered 2012-04-03: 40 mg via INTRAVENOUS
  Filled 2012-04-03: qty 4

## 2012-04-03 MED ORDER — ACETAMINOPHEN 650 MG RE SUPP: 650.0000 mg | Freq: Four times a day (QID) | RECTAL | Status: DC | PRN

## 2012-04-03 MED ORDER — ASPIRIN EC 81 MG PO TBEC
81.0000 mg | DELAYED_RELEASE_TABLET | Freq: Every day | ORAL | Status: DC
  Administered 2012-04-04 – 2012-04-06 (×3): 81 mg via ORAL
  Filled 2012-04-03 (×4): qty 1

## 2012-04-03 MED ORDER — VITAMIN B-1 100 MG PO TABS
100.0000 mg | ORAL_TABLET | Freq: Every day | ORAL | Status: DC
  Administered 2012-04-04 – 2012-04-07 (×4): 100 mg via ORAL
  Filled 2012-04-03 (×4): qty 1

## 2012-04-03 MED ORDER — SODIUM CHLORIDE 0.9 % IJ SOLN: 3.0000 mL | Freq: Two times a day (BID) | INTRAMUSCULAR | Status: DC

## 2012-04-03 MED ORDER — ONDANSETRON HCL 4 MG/5ML PO SOLN: 4.0000 mg | Freq: Two times a day (BID) | ORAL | Status: DC | PRN

## 2012-04-03 MED ORDER — NITROGLYCERIN 2 % TD OINT
1.0000 [in_us] | TOPICAL_OINTMENT | Freq: Once | TRANSDERMAL | Status: AC
  Administered 2012-04-03: 1 [in_us] via TOPICAL
  Filled 2012-04-03: qty 1

## 2012-04-03 MED ORDER — LORAZEPAM 2 MG/ML IJ SOLN: 1.0000 mg | Freq: Four times a day (QID) | INTRAMUSCULAR | Status: AC | PRN

## 2012-04-03 MED ORDER — SODIUM CHLORIDE 0.9 % IV SOLN
250.0000 mL | INTRAVENOUS | Status: DC | PRN
Start: 2012-04-03 — End: 2012-04-07

## 2012-04-03 MED ORDER — FLUTICASONE PROPIONATE 50 MCG/ACT NA SUSP
2.0000 | Freq: Every day | NASAL | Status: DC
  Administered 2012-04-04 – 2012-04-07 (×3): 2 via NASAL
  Filled 2012-04-03: qty 16

## 2012-04-03 MED ORDER — SODIUM CHLORIDE 0.9 % IJ SOLN
3.0000 mL | Freq: Two times a day (BID) | INTRAMUSCULAR | Status: DC
  Administered 2012-04-04 – 2012-04-07 (×3): 3 mL via INTRAVENOUS

## 2012-04-03 MED ORDER — BUPROPION HCL ER (XL) 150 MG PO TB24
150.0000 mg | ORAL_TABLET | ORAL | Status: DC
  Administered 2012-04-04 – 2012-04-07 (×4): 150 mg via ORAL
  Filled 2012-04-03 (×6): qty 1

## 2012-04-03 MED ORDER — HYDROXYZINE HCL 10 MG PO TABS
10.0000 mg | ORAL_TABLET | Freq: Three times a day (TID) | ORAL | Status: DC | PRN
Start: 2012-04-03 — End: 2012-04-07
  Administered 2012-04-04 – 2012-04-07 (×8): 10 mg via ORAL
  Filled 2012-04-03 (×8): qty 1

## 2012-04-03 NOTE — ED Notes (Signed)
Per EMS pt has had 2 nitro 324 ASA in route, pain initially 7/10 no c/o 5/10  Pt c/o leg pain as well. Pt has bilateral leg swelling

## 2012-04-03 NOTE — H&P (Signed)
Hospital Admission Note Date: 04/03/2012  Patient name: Vanessa Palmer Medical record number: [id number] Date of birth: 08-30-1950 Age: 62 y.o. Gender: female PCP: [name], MD  Medical Service: Josefine Class  Attending physician:     1st Contact: Dr. Virgina Organ   Pager:[phone] 2nd Contact: Dr. Dierdre Searles    Pager:[phone] After 5 pm or weekends: 1st Contact:  Intern on call   Pager: [phone] 2nd Contact:  Resident on call  Pager: [phone]  Chief Complaint: Chest pain  History of Present Illness: Vanessa Palmer is a 62 year old female w pmh of CAD s/p NSTEMI (2004, 2004, 2005), HTN,  OSA, and depression presenting with sharp chest pains for about 24 hours.  Vanessa Palmer reports that she noticed sharp, shooting chest pains last night at home. She describes pains as located over left sternum and left anterior chest. Pains wax and wane and are 10 of 10 at worst severity. She does have history of prior MI, but says that symptoms today are different in that presentation when she experienced chest pressure, pain in arms or neck, or diaphoresis.  In the ED, she received nitroglycerin patch which she says that her pain from a 10 to an 8. At the time of our interview, she denied any current pain. She also reports a week or 2 of increasing lower extremity edema and shortness of breath. She has no diagnosis of CHF. At baseline, she sleeps on several pillows at home. She has noted no change in orthopnea and denies PND. She is noticed increasing shortness of breath with ambulation. She denies any dizziness or syncope. Had an episode of nausea and vomiting last night. Denies fevers, chills, myalgias, or malaise.  Meds: Current Outpatient Rx  Name  Route  Sig  Dispense  Refill  . ACETAMINOPHEN 325 MG PO TABS   Oral   Take 2 tablets (650 mg total) by mouth every 4 (four) hours as needed for pain.   100 tablet   2     Use tylenol most of time and only use Morphine if  ...   . AMLODIPINE BESYLATE 10 MG PO  TABS   Oral   Take 10 mg by mouth daily.         . ASPIRIN EC 81 MG PO TBEC   Oral   Take 81 mg by mouth daily.         . BUPROPION HCL ER (XL) 150 MG PO TB24   Oral   Take 1 tablet (150 mg total) by mouth every morning.   90 tablet   2   . FLUTICASONE PROPIONATE 50 MCG/ACT NA SUSP   Nasal   Place 2 sprays into the nose daily.   16 g   6   . HYDROXYZINE HCL 10 MG PO TABS   Oral   Take 1 tablet (10 mg total) by mouth 3 (three) times daily as needed for itching.   30 tablet   1   . LOSARTAN POTASSIUM 100 MG PO TABS   Oral   Take 1 tablet (100 mg total) by mouth 2 (two) times daily.   60 tablet   3   . METOPROLOL TARTRATE 50 MG PO TABS   Oral   Take 2 tablets (100 mg total) by mouth 2 (two) times daily.   120 tablet   3   . NITROGLYCERIN 0.3 MG SL SUBL   Sublingual   Place 0.3 mg under the tongue every 5 (five) minutes as needed. For chest  pain         . OMEPRAZOLE 40 MG PO CPDR   Oral   Take 40 mg by mouth 2 (two) times daily.         Marland Kitchen ONDANSETRON HCL 4 MG/5ML PO SOLN   Oral   Take 5 mLs (4 mg total) by mouth 2 (two) times daily as needed for nausea.   50 mL   0   . OXYCODONE-ACETAMINOPHEN 5-325 MG PO TABS   Oral   Take 1 tablet by mouth every 8 (eight) hours as needed for pain (for chronic joint pain).   120 tablet   0   . TOVIAZ 8 MG PO TB24   Oral   Take 8 mg by mouth Daily.           Allergies: Allergies as of 04/03/2012 - Review Complete 04/03/2012  Allergen Reaction Noted  . Percocet (oxycodone-acetaminophen) Itching 03/21/2011  . Ace inhibitors Other (See Comments)   . Hydromorphone hcl Other (See Comments)   . Propoxyphene-acetaminophen Itching   . Vicodin (hydrocodone-acetaminophen) Itching 03/21/2011   Past Medical History  Diagnosis Date  . Hyperlipidemia   . Hypertension   . Gout   . Obesity   . Depression   . DJD (degenerative joint disease)   . OSA (obstructive sleep apnea)     previously used CPAP, has lost  .  CAD (coronary artery disease)     s/p non-Q wave MI in 06/2001 and 2004, 2005  . ALCOHOL ABUSE 12/13/2005    Annotation: Sober since 11/06 Qualifier: Diagnosis of  By: Wallace Cullens MD, Natalia Leatherwood    . INTRINSIC ASTHMA, WITH EXACERBATION 09/27/2009    Qualifier: Diagnosis of  By: Denton Meek MD, Tillie Rung    . Chronic kidney disease (CKD), stage III (moderate) 09/19/2010  . CHF (congestive heart failure)    Past Surgical History  Procedure Date  . Partial hysterectomy     due to endometriosis  . Tubal ligation   . Cardiac catheterization     06/2001, 02/2002, 10/2004 (10-15% proximal stenosis of circumflex, diffuse disease of  OM1 and RCA)   Family History  Problem Relation Age of Onset  . Mental illness Mother   . Heart disease Father   . Diabetes Sister   . Diabetes Sister   . Diabetes Sister    History   Social History  . Marital Status: Single    Spouse Name: N/A    Number of Children: 3  . Years of Education: GED   Occupational History  . retired     previously worked as a Hospital doctor   Social History Main Topics  . Smoking status: Never Smoker   . Smokeless tobacco: Not on file  . Alcohol Use: Yes  . Drug Use: No  . Sexually Active: Not on file   Other Topics Concern  . Not on file   Social History Narrative   Lives alone here in [place].    Review of Systems: 10 pt ROS performed, pertinent positives and negatives noted in HPI  Physical Exam: Blood pressure 106/58, pulse 67, temperature 98.2 F (36.8 C), temperature source Oral, resp. rate 18, height 5\' 2"  (1.575 m), weight 278 lb (126.1 kg), SpO2 100.00%. Vitals reviewed. General: resting in bed, watching TV HEENT: PERRL, EOMI, no scleral icterus Chest: TTP of L anterior chest wall under L breast, reproduces pain Cardiac: RRR, 2/6 murmur LSB. No JVD appreciated Pulm: clear to auscultation bilaterally, no wheezes, rales, or rhonchi appreciated Abd: soft, nontender,  nondistended, BS present Ext: 2+ pretibial  edema Neuro: alert and oriented X3, cranial nerves II-XII grossly intact, strength and sensation to light touch equal in bilateral upper and lower extremities   Lab results: Basic Metabolic Panel:  Basename 04/03/12 1836  NA 141  K 4.0  CL 109  CO2 23  GLUCOSE 101*  BUN 26*  CREATININE 1.46*  CALCIUM 8.7  MG --  PHOS --   Liver Function Tests:  Basename 04/03/12 1836  AST 19  ALT 10  ALKPHOS 110  BILITOT 0.6  PROT 7.4  ALBUMIN 3.4*   CBC:  Basename 04/03/12 1836  WBC 4.6  NEUTROABS --  HGB 11.5*  HCT 33.3*  MCV 90.0  PLT 183   D-Dimer:  Basename 04/03/12 1836  DDIMER 0.30   Coagulation:  Basename 04/03/12 1836  LABPROT 14.3  INR 1.13   Urine Drug Screen: Drugs of Abuse     Component Value Date/Time   LABOPIA NEG 02/11/2012 0959   LABOPIA NONE DETECTED 12/30/2011 2221   COCAINSCRNUR NEG 02/11/2012 0959   COCAINSCRNUR NONE DETECTED 12/30/2011 2221   LABBENZ NEG 02/11/2012 0959   LABBENZ NONE DETECTED 12/30/2011 2221   LABBENZ POSITIVE* 08/10/2010 1710   AMPHETMU NONE DETECTED 12/30/2011 2221   AMPHETMU NEGATIVE 08/10/2010 1710   THCU NONE DETECTED 12/30/2011 2221   LABBARB NEG 02/11/2012 0959   LABBARB NONE DETECTED 12/30/2011 2221   Imaging results:  Dg Chest Portable 1 View  04/03/2012  *RADIOLOGY REPORT*  Clinical Data: Chest pain, history hypertension  PORTABLE CHEST - 1 VIEW  Comparison: 12/30/2011; 12/19/2010  Findings:  Grossly unchanged enlarged cardiac silhouette and mediastinal contours.  Worsening bibasilar opacities, right greater than left, favored to represent atelectasis.  Mild cephalization of flow without frank evidence of edema.  No definite pleural effusion or pneumothorax.  Grossly unchanged bones.  IMPRESSION: 1.  Mild pulmonary venous congestion without frank evidence of edema. 2.  Decreased lung volumes with worsening bibasilar opacities, right greater than left, favored to represent atelectasis.  Further evaluation with a PA and  lateral chest radiograph may be obtained as clinically indicated.   Original Report Authenticated By: Tacey Ruiz, MD     Other results: EKG: Sinus rhythm with rate 70, normal intervals, normal axis, some T-wave flattening since prior 12/31/11, otherwise no significant change  Assessment & Plan by Problem: Ms. Culley is a 62 year old female w pmh of CAD s/p NSTEMI (2004, 2004, 2005), HTN,  OSA, and depression presenting with sharp chest pains, likely MSK in nature.  1) Atypical chest pain in setting of CAD Patient describes 1 days symptoms of sharp shooting chest pains. Presentation is not typical for ACS. Pain is reproducible on palpation of the left chest wall; additionally patient reports that these symptoms are not similar to those she experienced with prior MI. Her EKG is unchanged from prior with no evidence of acute ischemia. Her initial troponin in the ED was negative. Ultimately suspect that this pain is musculoskeletal in nature. In terms of her CAD history, she had ardiac catheterization in 2004 showing left circumflex 10-20% proximal and mid stenosis, OM-1 very small. Not candidate for PCI at the time. During last hospital admission in 12/2011 she presented with similar complaints and had extensive cardiac workup. Her echo showed EF of 55% to 60% with no wall motion abnormalities. Her nuclear stress test showed reduced activity in the anterior wall of the left ventricle and adjacent anterolateral and anteroseptal segments. These findings raised som  suspicion for mild inducible ischemia, but were interpreted by Dr Sharyn Lull as false positive in setting of normal echocardiogram. Dr. Sharyn Lull doubted patient with significant coronary disease, and she was managed medically.  - Will admit to tele and cycle CE   - Continue BB, ASA, NTG patch. Not on statin therapy due to at goal LDL and intolerance.  - Obtain records from most recent hospital f/u w Dr. Sharyn Lull in am. Patient may need outpatient  follow up scheduled with Dr. Sharyn Lull prior to d/c  2) LE edema, shortness of breath Patient reports 2 weeks of worsening LE edema and SOB with exertion. Prior echo in November was with normal EF and no diastolic dysfunction. She does have 2+ pitting edema to mid leg bilaterally. CXR shows mild pulmonary vascular congestion without edema, and lung exam is without rales. Pro-BNP is mildly elevated at 239.2 (from 154 in 2012). Patient may have component of mild diastolic dysfunction, although this was not seen on prior echo. She does remark that she requires several pillows to sleep at night for "years." Denies PND. Leg swelling could also represent venous insufficiency, and SOB may reflect deconditioning. She may benefit from repeat echo by cardiology as outpatient. Received 1.5L of IVF in ED - IV lasix 40mg  (patient lasix naive at home) - Consider repeat echo, maybe as outpatient? - No IVF  3) EtOH abuse Patient w history of chronic EtOH abuse. Has h/o presenting to the ED inebriated.  - Check EtOH - CIWA  4) HTN On metoprolol and losartan at home. BP on admission 106/58. - Continue home antihypertensives  5) CKD stage III Cr. 1.46 on admission, variable baseline creatinine range (1.3-2.5 over past year). GFR 44. Follows at [facility] (Dr. Caryn Section). Stable.  6) Hyperlipidemia LDL 56 and HDL 41 in November 2013. Not currently on statin therapy.  - Will start moderate dose statin given new AHA guidelines and pt's high CVD risk  7) Chronic back pain  MRI lumbar spine in 2010 showed disc bulge at L5 nerve root, also w DJD. - Continue home percocet   8) Urinary Incontinence Chronic urge incontinence, wears adult diaper at home.   -continue Toviaz   9) Acid Reflux On protonix at home. - continue PPI  10) OSA Says she does not use CPAP at home -recomment repeat sleep study as outpatient   Dispo: Disposition is deferred at this time, awaiting improvement of current medical problems.  Anticipated discharge in approximately 1-2 day(s).   The patient does have a current PCP (MULLEN, EMILY, MD), therefore will be requiring OPC follow-up after discharge.   The patient does not have transportation limitations that hinder transportation to clinic appointments.  Signed: Bronson Curb 04/03/2012, 8:21 PM

## 2012-04-03 NOTE — ED Provider Notes (Signed)
History    CSN: [id number] Arrival date & time 04/03/12  1802 First MD Initiated Contact with Patient 04/03/12 1804      Chief Complaint  Patient presents with  . Chest Pain    HPI Patient presents to the emergency room with complaints of chest pain that started yesterday. She has history of non-Q wave myocardial infarction.  Last event was in 2005. Patient states the pain has been constant and sharp in the left chest. It does wax and wane with regards to the intensity of the pain. The pain increased this evening so she decided to come to the emergency room to be evaluated. Patient has history of coronary artery disease.  She does not feel that this pain is the same as when she had her heart attack. Patient was brought in by EMS. She was given nitroglycerin and aspirin. The pain decreased slightly from 7/10-5/10. Patient denies any history of blood clot or DVT. She has noticed swelling in both of her legs. She denies any shortness of breath fevers coughing sore throat or any other complaints.   Patient was in his back in November of 2013 for chest pain. She ruled out and had a stress test. The results are listed below. Past Medical History  Diagnosis Date  . Hyperlipidemia   . Hypertension   . Gout   . Obesity   . Depression   . DJD (degenerative joint disease)   . OSA (obstructive sleep apnea)     previously used CPAP, has lost  . CAD (coronary artery disease)     s/p non-Q wave MI in 06/2001 and 2004, 2005  . ALCOHOL ABUSE 12/13/2005    Annotation: Sober since 11/06 Qualifier: Diagnosis of  By: Wallace Cullens MD, Natalia Leatherwood    . INTRINSIC ASTHMA, WITH EXACERBATION 09/27/2009    Qualifier: Diagnosis of  By: Denton Meek MD, Tillie Rung    . Chronic kidney disease (CKD), stage III (moderate) 09/19/2010  . CHF (congestive heart failure)     Past Surgical History  Procedure Date  . Partial hysterectomy     due to endometriosis  . Tubal ligation   . Cardiac catheterization     06/2001, 02/2002, 10/2004  (10-15% proximal stenosis of circumflex, diffuse disease of  OM1 and RCA)    Family History  Problem Relation Age of Onset  . Mental illness Mother   . Heart disease Father   . Diabetes Sister   . Diabetes Sister   . Diabetes Sister     History  Substance Use Topics  . Smoking status: Never Smoker   . Smokeless tobacco: Not on file  . Alcohol Use: Yes    OB History    Grav Para Term Preterm Abortions TAB SAB Ect Mult Living                  Review of Systems  All other systems reviewed and are negative.    Allergies  Percocet; Ace inhibitors; Hydromorphone hcl; Propoxyphene-acetaminophen; and Vicodin  Home Medications   Current Outpatient Rx  Name  Route  Sig  Dispense  Refill  . ACETAMINOPHEN 325 MG PO TABS   Oral   Take 2 tablets (650 mg total) by mouth every 4 (four) hours as needed for pain.   100 tablet   2     Use tylenol most of time and only use Morphine if  ...   . AMLODIPINE BESYLATE 10 MG PO TABS      TAKE 1 TABLET (10 MG  TOTAL) BY MOUTH DAILY.   30 tablet   4   . ASPIRIN EC 81 MG PO TBEC   Oral   Take 81 mg by mouth daily.         Marland Kitchen BENZONATATE 100 MG PO CAPS   Oral   Take 100 mg by mouth 3 (three) times daily as needed. Cough         . BUPROPION HCL ER (XL) 150 MG PO TB24   Oral   Take 1 tablet (150 mg total) by mouth every morning.   90 tablet   2   . FLUTICASONE PROPIONATE 50 MCG/ACT NA SUSP   Nasal   Place 2 sprays into the nose daily.   16 g   6   . HYDROXYZINE HCL 10 MG PO TABS   Oral   Take 1 tablet (10 mg total) by mouth 3 (three) times daily as needed for itching.   30 tablet   1   . LOSARTAN POTASSIUM 100 MG PO TABS   Oral   Take 1 tablet (100 mg total) by mouth 2 (two) times daily.   60 tablet   3   . METOPROLOL TARTRATE 50 MG PO TABS   Oral   Take 2 tablets (100 mg total) by mouth 2 (two) times daily.   120 tablet   3   . NITROGLYCERIN 0.3 MG SL SUBL   Sublingual   Place 0.3 mg under the tongue  every 5 (five) minutes as needed. For chest pain         . OMEPRAZOLE 40 MG PO CPDR   Oral   Take 1 capsule (40 mg total) by mouth daily.   30 capsule   5     Please d/c any remaining Rx for famotidine.  Thank ...   . ONDANSETRON HCL 4 MG/5ML PO SOLN   Oral   Take 5 mLs (4 mg total) by mouth 2 (two) times daily as needed for nausea.   50 mL   0   . OXYCODONE-ACETAMINOPHEN 5-325 MG PO TABS   Oral   Take 1 tablet by mouth every 8 (eight) hours as needed for pain (for chronic joint pain).   120 tablet   0   . TOVIAZ 8 MG PO TB24   Oral   Take 8 mg by mouth Daily.           BP 117/69  Temp 98.2 F (36.8 C) (Oral)  Resp 16  Ht 5\' 2"  (1.575 m)  Wt 278 lb (126.1 kg)  BMI 50.85 kg/m2  SpO2 100%  Physical Exam  Nursing note and vitals reviewed. Constitutional: She appears well-developed and well-nourished. No distress.       Obese  HENT:  Head: Normocephalic and atraumatic.  Right Ear: External ear normal.  Left Ear: External ear normal.  Eyes: Conjunctivae normal are normal. Right eye exhibits no discharge. Left eye exhibits no discharge. No scleral icterus.  Neck: Neck supple. No tracheal deviation present.  Cardiovascular: Normal rate, regular rhythm and intact distal pulses.   Pulmonary/Chest: Effort normal and breath sounds normal. No stridor. No respiratory distress. She has no wheezes. She has no rales. Tenderness: mild tenderness to left chest wall does not reproduce her pain.  Abdominal: Soft. Bowel sounds are normal. She exhibits no distension. There is no tenderness. There is no rebound and no guarding.  Musculoskeletal: She exhibits edema and tenderness.       No cyanosis, extremities are warm  Neurological:  She is alert. She has normal strength. No sensory deficit. Cranial nerve deficit:  no gross defecits noted. She exhibits normal muscle tone. She displays no seizure activity. Coordination normal.  Skin: Skin is warm and dry. No rash noted. She is not  diaphoretic.  Psychiatric: She has a normal mood and affect.    ED Course  Procedures (including critical care time) Reviewed stress test Nov 2013 IMPRESSION:  1. Reduced activity in the anterior wall of the left ventricle and  adjacent anterolateral and anteroseptal segments is partially  attributable to breast attenuation and motion artifact, but the  degree of reduced activity is greater on stress images compared to  rest images, raising suspicion for mild inducible ischemia.  Admittedly the motion artifact does comes cause some reduction in  positive predictive value. Careful correlation with the ECG  portion of the exam is recommended.  EKG Sinus rhythm rate 70 Normal intervals, normal axis, nonspecific T-wave changes anterior and lateral leads   Labs Reviewed  CBC - Abnormal; Notable for the following:    RBC 3.70 (*)     Hemoglobin 11.5 (*)     HCT 33.3 (*)     All other components within normal limits  COMPREHENSIVE METABOLIC PANEL - Abnormal; Notable for the following:    Glucose, Bld 101 (*)     BUN 26 (*)     Creatinine, Ser 1.46 (*)     Albumin 3.4 (*)     GFR calc non Af Amer 38 (*)     GFR calc Af Amer 44 (*)     All other components within normal limits  PRO B NATRIURETIC PEPTIDE - Abnormal; Notable for the following:    Pro B Natriuretic peptide (BNP) 239.2 (*)     All other components within normal limits  CBC - Abnormal; Notable for the following:    RBC 3.77 (*)     Hemoglobin 11.5 (*)     HCT 33.7 (*)     All other components within normal limits  CREATININE, SERUM - Abnormal; Notable for the following:    Creatinine, Ser 1.48 (*)     GFR calc non Af Amer 38 (*)     GFR calc Af Amer 44 (*)     All other components within normal limits  PROTIME-INR  APTT  D-DIMER, QUANTITATIVE  POCT I-STAT TROPONIN I  ETHANOL  POCT I-STAT TROPONIN I  TROPONIN I  TROPONIN I  BASIC METABOLIC PANEL  CBC   Dg Chest Portable 1 View  04/03/2012  *RADIOLOGY  REPORT*  Clinical Data: Chest pain, history hypertension  PORTABLE CHEST - 1 VIEW  Comparison: 12/30/2011; 12/19/2010  Findings:  Grossly unchanged enlarged cardiac silhouette and mediastinal contours.  Worsening bibasilar opacities, right greater than left, favored to represent atelectasis.  Mild cephalization of flow without frank evidence of edema.  No definite pleural effusion or pneumothorax.  Grossly unchanged bones.  IMPRESSION: 1.  Mild pulmonary venous congestion without frank evidence of edema. 2.  Decreased lung volumes with worsening bibasilar opacities, right greater than left, favored to represent atelectasis.  Further evaluation with a PA and lateral chest radiograph may be obtained as clinically indicated.   Original Report Authenticated By: Tacey Ruiz, MD      1. Chest pain   2. Allergic rhinitis   3. Pain in joint, multiple sites   4. HYPERTENSION   5. GERD (gastroesophageal reflux disease)   6. DJD (degenerative joint disease)  MDM  Known history of CAD.  Previous stress test with questionable abnormality.  Will admit for serial enzymes, further eval.  Pt remains stable.  Pain free.        Celene Kras, MD 04/04/12 [phone]

## 2012-04-04 DIAGNOSIS — I82403 Acute embolism and thrombosis of unspecified deep veins of lower extremity, bilateral: Secondary | ICD-10-CM

## 2012-04-04 DIAGNOSIS — D6859 Other primary thrombophilia: Secondary | ICD-10-CM | POA: Insufficient documentation

## 2012-04-04 DIAGNOSIS — R079 Chest pain, unspecified: Secondary | ICD-10-CM

## 2012-04-04 DIAGNOSIS — N183 Chronic kidney disease, stage 3 unspecified: Secondary | ICD-10-CM

## 2012-04-04 DIAGNOSIS — F1010 Alcohol abuse, uncomplicated: Secondary | ICD-10-CM

## 2012-04-04 DIAGNOSIS — J45901 Unspecified asthma with (acute) exacerbation: Secondary | ICD-10-CM

## 2012-04-04 DIAGNOSIS — I10 Essential (primary) hypertension: Secondary | ICD-10-CM

## 2012-04-04 DIAGNOSIS — I2510 Atherosclerotic heart disease of native coronary artery without angina pectoris: Secondary | ICD-10-CM

## 2012-04-04 DIAGNOSIS — R0789 Other chest pain: Secondary | ICD-10-CM

## 2012-04-04 DIAGNOSIS — Z86718 Personal history of other venous thrombosis and embolism: Secondary | ICD-10-CM | POA: Insufficient documentation

## 2012-04-04 DIAGNOSIS — R1013 Epigastric pain: Secondary | ICD-10-CM

## 2012-04-04 DIAGNOSIS — K3189 Other diseases of stomach and duodenum: Secondary | ICD-10-CM

## 2012-04-04 DIAGNOSIS — G4733 Obstructive sleep apnea (adult) (pediatric): Secondary | ICD-10-CM

## 2012-04-04 DIAGNOSIS — M2550 Pain in unspecified joint: Secondary | ICD-10-CM

## 2012-04-04 DIAGNOSIS — M79609 Pain in unspecified limb: Secondary | ICD-10-CM

## 2012-04-04 HISTORY — DX: Acute embolism and thrombosis of unspecified deep veins of lower extremity, bilateral: I82.403

## 2012-04-04 LAB — BASIC METABOLIC PANEL
BUN: 24 mg/dL — ABNORMAL HIGH (ref 6–23)
Calcium: 9 mg/dL (ref 8.4–10.5)
Chloride: 105 mEq/L (ref 96–112)
GFR calc Af Amer: 49 mL/min — ABNORMAL LOW (ref >90)
GFR calc Af Amer: 49 mL/min — ABNORMAL LOW (ref >90)
GFR calc non Af Amer: 42 mL/min — ABNORMAL LOW (ref >90)
Glucose, Bld: 104 mg/dL — ABNORMAL HIGH (ref 70–99)
Glucose, Bld: 117 mg/dL — ABNORMAL HIGH (ref 70–99)
Potassium: 3.7 mEq/L (ref 3.5–5.1)
Potassium: 3.7 mEq/L (ref 3.5–5.1)
Sodium: 139 mEq/L (ref 135–145)
Sodium: 137 mEq/L (ref 135–145)

## 2012-04-04 LAB — CBC
HCT: 33 % — ABNORMAL LOW (ref 36.0–46.0)
Hemoglobin: 11.3 g/dL — ABNORMAL LOW (ref 12.0–15.0)
MCH: 30.8 pg (ref 26.0–34.0)
MCHC: 34.2 g/dL (ref 30.0–36.0)
RBC: 3.67 MIL/uL — ABNORMAL LOW (ref 3.87–5.11)

## 2012-04-04 LAB — GLUCOSE, CAPILLARY: Glucose-Capillary: 90 mg/dL (ref 70–99)

## 2012-04-04 LAB — TROPONIN I: Troponin I: <0.3 ng/mL (ref <0.30)

## 2012-04-04 LAB — MAGNESIUM: Magnesium: 1.6 mg/dL (ref 1.5–2.5)

## 2012-04-04 MED ORDER — HEPARIN BOLUS VIA INFUSION
1000.0000 [IU] | Freq: Once | INTRAVENOUS | Status: AC
  Administered 2012-04-04: 1000 [IU] via INTRAVENOUS
  Filled 2012-04-04: qty 1000

## 2012-04-04 MED ORDER — HEPARIN (PORCINE) IN NACL 100-0.45 UNIT/ML-% IJ SOLN
1350.0000 [IU]/h | INTRAMUSCULAR | Status: DC
Start: 2012-04-04 — End: 2012-04-06
  Administered 2012-04-04 – 2012-04-05 (×2): 1400 [IU]/h via INTRAVENOUS
  Administered 2012-04-06: 1350 [IU]/h via INTRAVENOUS
  Filled 2012-04-04 (×4): qty 250

## 2012-04-04 MED ORDER — SIMVASTATIN 20 MG PO TABS
20.0000 mg | ORAL_TABLET | Freq: Every day | ORAL | Status: DC
  Administered 2012-04-04 – 2012-04-05 (×2): 20 mg via ORAL
  Filled 2012-04-04 (×3): qty 1

## 2012-04-04 MED ORDER — MAGNESIUM OXIDE 400 (241.3 MG) MG PO TABS
400.0000 mg | ORAL_TABLET | Freq: Once | ORAL | Status: AC
  Administered 2012-04-04: 400 mg via ORAL
  Filled 2012-04-04: qty 1

## 2012-04-04 NOTE — Progress Notes (Signed)
Vascular Lab called and stated patient has bilateral DVTs, i notified Dr. Virgina Organ.  No new orders received.

## 2012-04-04 NOTE — Progress Notes (Signed)
ANTICOAGULATION CONSULT NOTE - Initial Consult  Pharmacy Consult for Heparin Indication: DVT  Allergies  Allergen Reactions  . Percocet (Oxycodone-Acetaminophen) Itching    Ok with medicine for itching  . Ace Inhibitors Other (See Comments)     Angioedema  . Hydromorphone Hcl Other (See Comments)    diffuse rash  . Propoxyphene-Acetaminophen Itching  . Vicodin (Hydrocodone-Acetaminophen) Itching    Patient Measurements: Height: 5\' 2"  (157.5 cm) Weight: 270 lb 3.2 oz (122.562 kg) IBW/kg (Calculated) : 50.1  Heparin Dosing Weight: 81 Kg  Vital Signs: Temp: 98.1 F (36.7 C) (02/07 1200) Temp src: Oral (02/07 0941) BP: 110/75 mmHg (02/07 1200) Pulse Rate: 62  (02/07 1200)  Labs:  Basename 04/04/12 1415 04/04/12 0640 04/04/12 0630 04/04/12 0049 04/03/12 2232 04/03/12 1836  HGB -- 11.3* -- -- 11.5* --  HCT -- 33.0* -- -- 33.7* 33.3*  PLT -- 173 -- -- 189 183  APTT -- -- -- -- -- 33  LABPROT -- -- -- -- -- 14.3  INR -- -- -- -- -- 1.13  HEPARINUNFRC -- -- -- -- -- --  CREATININE 1.34* 1.35* -- -- 1.48* --  CKTOTAL -- -- -- -- -- --  CKMB -- -- -- -- -- --  TROPONINI -- -- <0.30 <0.30 -- --    Estimated Creatinine Clearance: 56.4 ml/min (by C-G formula based on Cr of 1.34).   Medical History: Past Medical History  Diagnosis Date  . Hyperlipidemia   . Hypertension   . Gout   . Obesity   . Depression   . DJD (degenerative joint disease)   . OSA (obstructive sleep apnea)     previously used CPAP, has lost  . CAD (coronary artery disease)     s/p non-Q wave MI in 06/2001 and 2004, 2005  . ALCOHOL ABUSE 12/13/2005    Annotation: Sober since 11/06 Qualifier: Diagnosis of  By: Wallace Cullens MD, Natalia Leatherwood    . INTRINSIC ASTHMA, WITH EXACERBATION 09/27/2009    Qualifier: Diagnosis of  By: Denton Meek MD, Tillie Rung    . Chronic kidney disease (CKD), stage III (moderate) 09/19/2010  . CHF (congestive heart failure)     Medications:  Prescriptions prior to admission  Medication Sig  Dispense Refill  . acetaminophen (TYLENOL) 325 MG tablet Take 2 tablets (650 mg total) by mouth every 4 (four) hours as needed for pain.  100 tablet  2  . amLODipine (NORVASC) 10 MG tablet Take 10 mg by mouth daily.      Marland Kitchen aspirin EC 81 MG tablet Take 81 mg by mouth daily.      Marland Kitchen buPROPion (WELLBUTRIN XL) 150 MG 24 hr tablet Take 1 tablet (150 mg total) by mouth every morning.  90 tablet  2  . fluticasone (FLONASE) 50 MCG/ACT nasal spray Place 2 sprays into the nose daily.  16 g  6  . hydrOXYzine (ATARAX/VISTARIL) 10 MG tablet Take 1 tablet (10 mg total) by mouth 3 (three) times daily as needed for itching.  30 tablet  1  . losartan (COZAAR) 100 MG tablet Take 1 tablet (100 mg total) by mouth 2 (two) times daily.  60 tablet  3  . metoprolol (LOPRESSOR) 50 MG tablet Take 2 tablets (100 mg total) by mouth 2 (two) times daily.  120 tablet  3  . nitroGLYCERIN (NITROSTAT) 0.3 MG SL tablet Place 0.3 mg under the tongue every 5 (five) minutes as needed. For chest pain      . omeprazole (PRILOSEC) 40 MG capsule Take  40 mg by mouth 2 (two) times daily.      . ondansetron (ZOFRAN) 4 MG/5ML solution Take 5 mLs (4 mg total) by mouth 2 (two) times daily as needed for nausea.  50 mL  0  . oxyCODONE-acetaminophen (ROXICET) 5-325 MG per tablet Take 1 tablet by mouth every 8 (eight) hours as needed for pain (for chronic joint pain).  120 tablet  0  . TOVIAZ 8 MG TB24 Take 8 mg by mouth Daily.        Assessment: Vanessa Palmer was admitted for chest pain, atypical for ACS and troponins were negative x 2. She had bilateral leg pain this AM and dopplers are positive for BLE DVT. Noted patient was on SQ heparin for VTE prophylaxis, and received 5000 units at ~ 1400 today. Patient is not on anticoagulants PTA, baseline INR is nml, H/H are low-nml and plts are nml. LFTs are normal, Alb is slightly low at 3.4  Goal of Therapy:  Heparin level 0.3-0.7 units/ml Monitor platelets by anticoagulation protocol: Yes   Plan:   Give 1000 units bolus x 1- given SQ heparin given earlier today Start heparin infusion at 1400 units/hr Check anti-Xa level in 6 hours and daily while on heparin Continue to monitor H&H and platelets Will f/up for chronic anticoagulation plans  Thanks, Ambriel Gorelick K. Allena Katz, PharmD, BCPS.  Clinical Pharmacist Pager [phone]. 04/04/2012 4:47 PM

## 2012-04-04 NOTE — Progress Notes (Signed)
*  Preliminary Results* Bilateral lower extremity venous duplex completed. Bilateral lower extremities are positive for deep vein thrombosis involving the right femoral vein, right popliteal vein, left femoral vein, left popliteal vein, and left posterior tibial vein. No evidence of Baker's cyst bilaterally.  Preliminary results discussed with Dr.Li.  04/04/2012 4:20 PM Gertie Fey, RDMS, RDCS

## 2012-04-04 NOTE — H&P (Signed)
Internal Medicine Teaching Service Attending Note Date: 04/04/2012  Patient name: Vanessa Palmer  Medical record number: [id number]  Date of birth: Jul 02, 1950    This patient has been seen and discussed with the house staff. Please see their note for complete details. I concur with their findings with the following additions/corrections:  62 year old with CAD s/p NSTEMI (2004, 2004, 2005), HTN, OSA, and depression presenting with sharp chest pains for about 24 hours. As described above she already had fairly extensive workup by Dr. Sharyn Lull during stay in Nov 2013 for atypical chest pain. Her cardiac markers and EKG are negative for any evidence of acute ischemia.  Asian is currently comfortable and without any chest pain whatsoever. She is eager to go back home.   We have contacted Dr. Sharyn Lull and he sees no further indication for further cardiac workup at this time.      [name] 04/04/2012, 1:56 PM

## 2012-04-04 NOTE — Progress Notes (Addendum)
Subjective: Vanessa Palmer was seen and examined at bedside.  She denies any current chest pain or shortness of breath but does claim to have b/l lower extremity pain which is new and recent cramping.  She has no other complaints at this time.  Denies any fever, chills, N/V/D, headaches, abdominal pain, or any urinary complaints at this time.    Objective: Vital signs in last 24 hours: Filed Vitals:   04/03/12 1811 04/03/12 1921 04/03/12 2237 04/04/12 0437  BP: 117/69 106/58 131/81 120/82  Pulse:  67 67 67  Temp: 98.2 F (36.8 C)  97.7 F (36.5 C) 98.3 F (36.8 C)  TempSrc: Oral  Oral Oral  Resp: 16 18 20 20   Height: 5\' 2"  (1.575 m)  5\' 2"  (1.575 m)   Weight: 278 lb (126.1 kg)  270 lb 3.2 oz (122.562 kg)   SpO2: 100% 100% 99% 100%   Weight change:   Intake/Output Summary (Last 24 hours) at 04/04/12 0807 Last data filed at 04/04/12 0300  Gross per 24 hour  Intake      0 ml  Output   1200 ml  Net  -1200 ml   Vitals reviewed. General: sitting up on the side of the bed HEENT: PERRLA, EOMI, no scleral icterus  Chest: non-tender to palpation Cardiac: RRR, 2/6 murmur LSB. No JVD appreciated  Pulm: clear to auscultation bilaterally, no wheezes, rales, or rhonchi appreciated  Abd: soft, nontender, nondistended, BS present  Ext: 1+ pretibial edema, very tender to palpation b/l lower extremities up to knee Neuro: alert and oriented X3, cranial nerves II-XII grossly intact, strength and sensation to light touch equal in bilateral upper and lower extremities  Lab Results: Basic Metabolic Panel:  Lab 04/04/12 1610 04/03/12 2232 04/03/12 1836  NA 139 -- 141  K 3.7 -- 4.0  CL 105 -- 109  CO2 24 -- 23  GLUCOSE 104* -- 101*  BUN 24* -- 26*  CREATININE 1.35* 1.48* --  CALCIUM 8.8 -- 8.7  MG -- -- --  PHOS -- -- --   Liver Function Tests:  Lab 04/03/12 1836  AST 19  ALT 10  ALKPHOS 110  BILITOT 0.6  PROT 7.4  ALBUMIN 3.4*   CBC:  Lab 04/04/12 0640 04/03/12 2232  WBC 4.5 5.0   NEUTROABS -- --  HGB 11.3* 11.5*  HCT 33.0* 33.7*  MCV 89.9 89.4  PLT 173 189   Cardiac Enzymes:  Lab 04/04/12 0630 04/04/12 0049  CKTOTAL -- --  CKMB -- --  CKMBINDEX -- --  TROPONINI <0.30 <0.30   BNP:  Lab 04/03/12 2055  PROBNP 239.2*   D-Dimer:  Lab 04/03/12 1836  DDIMER 0.30   CBG:  Lab 04/03/12 2223  GLUCAP 90   Coagulation:  Lab 04/03/12 1836  LABPROT 14.3  INR 1.13   Urine Drug Screen: Drugs of Abuse     Component Value Date/Time   LABOPIA NEG 02/11/2012 0959   LABOPIA NONE DETECTED 12/30/2011 2221   COCAINSCRNUR NEG 02/11/2012 0959   COCAINSCRNUR NONE DETECTED 12/30/2011 2221   LABBENZ NEG 02/11/2012 0959   LABBENZ NONE DETECTED 12/30/2011 2221   LABBENZ POSITIVE* 08/10/2010 1710   AMPHETMU NONE DETECTED 12/30/2011 2221   AMPHETMU NEGATIVE 08/10/2010 1710   THCU NONE DETECTED 12/30/2011 2221   LABBARB NEG 02/11/2012 0959   LABBARB NONE DETECTED 12/30/2011 2221    Alcohol Level:  Lab 04/03/12 2150  ETH <11   Studies/Results: Dg Chest Portable 1 View  04/03/2012  *RADIOLOGY REPORT*  Clinical Data: Chest pain, history hypertension  PORTABLE CHEST - 1 VIEW  Comparison: 12/30/2011; 12/19/2010  Findings:  Grossly unchanged enlarged cardiac silhouette and mediastinal contours.  Worsening bibasilar opacities, right greater than left, favored to represent atelectasis.  Mild cephalization of flow without frank evidence of edema.  No definite pleural effusion or pneumothorax.  Grossly unchanged bones.  IMPRESSION: 1.  Mild pulmonary venous congestion without frank evidence of edema. 2.  Decreased lung volumes with worsening bibasilar opacities, right greater than left, favored to represent atelectasis.  Further evaluation with a PA and lateral chest radiograph may be obtained as clinically indicated.   Original Report Authenticated By: Tacey Ruiz, MD    Medications: I have reviewed the patient's current medications. Scheduled Meds:   . amLODipine  10 mg Oral  Daily  . aspirin EC  81 mg Oral Daily  . buPROPion  150 mg Oral [id number]  . fesoterodine  8 mg Oral Daily  . fluticasone  2 spray Each Nare Daily  . folic acid  1 mg Oral Daily  . furosemide  40 mg Intravenous Daily  . heparin  5,000 Units Subcutaneous Q8H  . losartan  100 mg Oral BID  . metoprolol  100 mg Oral BID  . multivitamin with minerals  1 tablet Oral Daily  . pantoprazole  40 mg Oral Daily  . sodium chloride  3 mL Intravenous Q12H  . sodium chloride  3 mL Intravenous Q12H  . thiamine  100 mg Oral Daily   Or  . thiamine  100 mg Intravenous Daily   Continuous Infusions:  PRN Meds:.sodium chloride, acetaminophen, acetaminophen, hydrOXYzine, LORazepam, LORazepam, ondansetron, oxyCODONE-acetaminophen, sodium chloride Assessment/Plan: Vanessa Palmer is a 62 year old female w pmh of CAD s/p NSTEMI (2004, 2004, 2005), HTN, OSA, and depression presenting with sharp chest pains, likely MSK in nature.   1) Atypical chest pain in setting of CAD-- presented with 1 day of sharp shooting chest pains now resolved. Presentation is not typical for ACS.  Pain was reproducible on palpation of the left chest wall; additionally patient reports that these symptoms are not similar to those she experienced with prior MI.  Her EKG is unchanged from prior with no evidence of acute ischemia. Troponin x2 neg.  Ultimately suspect that this pain is musculoskeletal in nature.  In terms of her CAD history, she had cardiac catheterization in 2004 showing left circumflex 10-20% proximal and mid stenosis, OM-1 very small. Not candidate for PCI at the time.  During last hospital admission in 12/2011 she presented with similar complaints and had extensive cardiac workup.  Her echo showed EF of 55% to 60% with no wall motion abnormalities.  Her nuclear stress test showed reduced activity in the anterior wall of the left ventricle and adjacent anterolateral and anteroseptal segments.  These findings raised som suspicion for  mild inducible ischemia, but were interpreted by Dr Sharyn Lull as false positive in setting of normal echocardiogram.  Dr. Sharyn Lull doubted patient with significant coronary disease, and she was managed medically.  - Trop x2 neg - Continue BB, ASA, NTG patch, start statin - spoke to Dr. Sharyn Lull on the phone--he will see her as an outpatient on Monday 04/07/12 at 345pm  2) LE edema, shortness of breath  Patient reports 2 weeks of worsening LE edema and SOB with exertion. Prior echo in November was with normal EF and no diastolic dysfunction. She does have 2+ pitting edema to mid leg bilaterally. CXR shows mild pulmonary vascular congestion  without edema, and lung exam is without rales. Pro-BNP is mildly elevated at 239.2 (from 154 in 2012). Patient may have component of mild diastolic dysfunction, although this was not seen on prior echo. She does remark that she requires several pillows to sleep at night for "years." Denies PND. Leg swelling could also represent venous insufficiency, and SOB may reflect deconditioning. She may benefit from repeat echo by cardiology as outpatient.  Received 1.5L of IVF in ED  - given IV lasix 40mg  (patient lasix naive at home) yesterday, d/c today.    - Consider repeat echo as an outpatient - No IVF  - monitor I/O: -1.2L/24 hours - f/u mag  3) EtOH abuse  Patient w history of chronic EtOH abuse. Has h/o presenting to the ED inebriated.  - EtOH <11 - CIWA   4) HTN  On metoprolol and losartan at home. BP on admission 106/58.  - Continue home antihypertensives   5) CKD stage III  Cr. 1.46 on admission, variable baseline creatinine range (1.3-2.5 over past year). GFR 44. Follows at [facility] (Dr. Caryn Section).  Stable--down to 1.35 today  6) Hyperlipidemia  LDL 56 and HDL 41 in November 2013. Not currently on statin therapy.  - Continue statin given new AHA guidelines and pt's high CVD risk   7) Chronic back pain  MRI lumbar spine in 2010 showed disc bulge at L5  nerve root, also w DJD.  - Continue home percocet   8) Urinary Incontinence  Chronic urge incontinence, wears adult diaper at home.  -continue Toviaz   9) Acid Reflux  On protonix at home.  - continue PPI   10) OSA  Says she does not use CPAP at home  -recomment repeat sleep study as outpatient   Dispo: likely d/c home today with follow up with Dr. Sharyn Lull The patient does have a current PCP (Criselda Peaches, EMILY, MD), therefore will be requiring OPC follow-up after discharge.  The patient does not have transportation limitations that hinder transportation to clinic appointments.  Services Needed at time of discharge: Y = Yes, Blank = No PT:   OT:   RN:   Equipment:   Other:     LOS: 1 day   Darden Palmer 04/04/2012, 8:07 AM

## 2012-04-04 NOTE — Care Management Utilization Note (Addendum)
CARE MANAGEMENT NOTE 04/04/2012  Patient:  Vanessa Palmer, Vanessa Palmer   Account Number:  [id number]  Date Initiated:  04/04/2012  Documentation initiated by:  Neyla Gauntt  Subjective/Objective Assessment:   62 yo female admitted with chestpain. PTA pt independent. No PCP.     Action/Plan:   Home when stable   Anticipated DC Date:  04/04/2012   Anticipated DC Plan:  HOME/SELF CARE      DC Planning Services  PCP issues      Choice offered to / List presented to:             Status of service:   Medicare Important Message given?   (If response is "NO", the following Medicare IM given date fields will be blank) Date Medicare IM given:   Date Additional Medicare IM given:    Discharge Disposition:    Per UR Regulation:  Reviewed for med. necessity/level of care/duration of stay  If discussed at Long Length of Stay Meetings, dates discussed:    Comments:  04/04/12 1400 Shatiqua Heroux,RN,BSN [phone]

## 2012-04-05 LAB — CBC
HCT: 31.6 % — ABNORMAL LOW (ref 36.0–46.0)
MCHC: 34.5 g/dL (ref 30.0–36.0)
Platelets: 169 10*3/uL (ref 150–400)
RDW: 13.6 % (ref 11.5–15.5)
WBC: 5.1 10*3/uL (ref 4.0–10.5)

## 2012-04-05 LAB — BASIC METABOLIC PANEL
CO2: 25 mEq/L (ref 19–32)
Calcium: 8.4 mg/dL (ref 8.4–10.5)
Chloride: 103 mEq/L (ref 96–112)
GFR calc non Af Amer: 37 mL/min — ABNORMAL LOW (ref >90)
Potassium: 3.7 mEq/L (ref 3.5–5.1)

## 2012-04-05 LAB — PHOSPHORUS: Phosphorus: 3.3 mg/dL (ref 2.3–4.6)

## 2012-04-05 LAB — MAGNESIUM: Magnesium: 1.7 mg/dL (ref 1.5–2.5)

## 2012-04-05 LAB — HEPARIN LEVEL (UNFRACTIONATED)
Heparin Unfractionated: 0.54 IU/mL (ref 0.30–0.70)
Heparin Unfractionated: 0.5 IU/mL (ref 0.30–0.70)

## 2012-04-05 MED ORDER — MAGNESIUM OXIDE 400 (241.3 MG) MG PO TABS
200.0000 mg | ORAL_TABLET | Freq: Two times a day (BID) | ORAL | Status: AC
  Administered 2012-04-05 (×2): 200 mg via ORAL
  Filled 2012-04-05 (×2): qty 0.5

## 2012-04-05 MED ORDER — COUMADIN BOOK
Freq: Once | Status: AC
  Administered 2012-04-05: 16:00:00
  Filled 2012-04-05: qty 1

## 2012-04-05 MED ORDER — WARFARIN VIDEO
Freq: Once | Status: AC
  Administered 2012-04-05: 14:00:00

## 2012-04-05 MED ORDER — MAGNESIUM HYDROXIDE 400 MG/5ML PO SUSP
30.0000 mL | Freq: Every day | ORAL | Status: DC | PRN
  Administered 2012-04-05 – 2012-04-06 (×2): 30 mL via ORAL
  Filled 2012-04-05 (×2): qty 30

## 2012-04-05 MED ORDER — WARFARIN - PHARMACIST DOSING INPATIENT: Freq: Every day | Status: DC

## 2012-04-05 MED ORDER — WARFARIN SODIUM 10 MG PO TABS
10.0000 mg | ORAL_TABLET | Freq: Once | ORAL | Status: AC
  Administered 2012-04-05: 10 mg via ORAL
  Filled 2012-04-05: qty 1

## 2012-04-05 NOTE — Progress Notes (Signed)
Subjective: Vanessa Palmer was seen and examined at bedside.  Found to have b/l DVTs on doppler study yesterday and started on IV heparin.  She denies any current chest pain or shortness of breath but continues to have b/l lower extremity pain that is slightly improved.  She has no other complaints at this time.  Denies any fever, chills, N/V/D, headaches, abdominal pain, or any urinary complaints at this time.    Objective: Vital signs in last 24 hours: Filed Vitals:   04/04/12 1200 04/04/12 1932 04/05/12 0000 04/05/12 0600  BP: 110/75 125/85 120/76 112/74  Pulse: 62 65 61 54  Temp: 98.1 F (36.7 C)  98.1 F (36.7 C) 98.1 F (36.7 C)  TempSrc:      Resp: 18  18 18   Height:      Weight:   270 lb 3.2 oz (122.562 kg)   SpO2: 100% 98% 98% 99%   Weight change: -7 lb 12.8 oz (-3.538 kg)  Intake/Output Summary (Last 24 hours) at 04/05/12 0846 Last data filed at 04/05/12 0659  Gross per 24 hour  Intake   1128 ml  Output   2200 ml  Net  -1072 ml   Vitals reviewed. General: sitting up on the side of the bed HEENT: PERRLA, EOMI, no scleral icterus  Chest: non-tender to palpation Cardiac: RRR, 2/6 murmur LSB. No JVD appreciated  Pulm: clear to auscultation bilaterally, no wheezes, rales, or rhonchi appreciated  Abd: soft, obese, nontender, nondistended, BS present  Ext: 1+ pretibial edema, tender to palpation b/l lower extremities up to knee Neuro: alert and oriented X3, cranial nerves II-XII grossly intact, strength and sensation to light touch equal in bilateral upper and lower extremities  Lab Results: Basic Metabolic Panel:  Recent Labs Lab 04/04/12 1415 04/05/12 0512  NA 137 136  K 3.7 3.7  CL 101 103  CO2 23 25  GLUCOSE 117* 99  BUN 24* 29*  CREATININE 1.34* 1.51*  CALCIUM 9.0 8.4  MG 1.6 1.7  PHOS  --  3.3   Liver Function Tests:  Recent Labs Lab 04/03/12 1836  AST 19  ALT 10  ALKPHOS 110  BILITOT 0.6  PROT 7.4  ALBUMIN 3.4*   CBC:  Recent Labs Lab  04/04/12 0640 04/05/12 0512  WBC 4.5 5.1  HGB 11.3* 10.9*  HCT 33.0* 31.6*  MCV 89.9 90.0  PLT 173 169   Cardiac Enzymes:  Recent Labs Lab 04/04/12 0049 04/04/12 0630  TROPONINI <0.30 <0.30   BNP:  Recent Labs Lab 04/03/12 2055  PROBNP 239.2*   D-Dimer:  Recent Labs Lab 04/03/12 1836  DDIMER 0.30   CBG:  Recent Labs Lab 04/03/12 2223  GLUCAP 90   Coagulation:  Recent Labs Lab 04/03/12 1836  LABPROT 14.3  INR 1.13   Urine Drug Screen: Drugs of Abuse     Component Value Date/Time   LABOPIA NEG 02/11/2012 0959   LABOPIA NONE DETECTED 12/30/2011 2221   COCAINSCRNUR NEG 02/11/2012 0959   COCAINSCRNUR NONE DETECTED 12/30/2011 2221   LABBENZ NEG 02/11/2012 0959   LABBENZ NONE DETECTED 12/30/2011 2221   LABBENZ POSITIVE* 08/10/2010 1710   AMPHETMU NONE DETECTED 12/30/2011 2221   AMPHETMU NEGATIVE 08/10/2010 1710   THCU NONE DETECTED 12/30/2011 2221   LABBARB NEG 02/11/2012 0959   LABBARB NONE DETECTED 12/30/2011 2221    Alcohol Level:  Recent Labs Lab 04/03/12 2150  ETH <11   Studies/Results: Dg Chest Portable 1 View  04/03/2012  *RADIOLOGY REPORT*  Clinical Data: Chest  pain, history hypertension  PORTABLE CHEST - 1 VIEW  Comparison: 12/30/2011; 12/19/2010  Findings:  Grossly unchanged enlarged cardiac silhouette and mediastinal contours.  Worsening bibasilar opacities, right greater than left, favored to represent atelectasis.  Mild cephalization of flow without frank evidence of edema.  No definite pleural effusion or pneumothorax.  Grossly unchanged bones.  IMPRESSION: 1.  Mild pulmonary venous congestion without frank evidence of edema. 2.  Decreased lung volumes with worsening bibasilar opacities, right greater than left, favored to represent atelectasis.  Further evaluation with a PA and lateral chest radiograph may be obtained as clinically indicated.   Original Report Authenticated By: Tacey Ruiz, MD    Medications: I have reviewed the patient's  current medications. Scheduled Meds: . amLODipine  10 mg Oral Daily  . aspirin EC  81 mg Oral Daily  . buPROPion  150 mg Oral BH-q7a  . fesoterodine  8 mg Oral Daily  . fluticasone  2 spray Each Nare Daily  . folic acid  1 mg Oral Daily  . losartan  100 mg Oral BID  . metoprolol  100 mg Oral BID  . multivitamin with minerals  1 tablet Oral Daily  . pantoprazole  40 mg Oral Daily  . simvastatin  20 mg Oral q1800  . sodium chloride  3 mL Intravenous Q12H  . thiamine  100 mg Oral Daily   Or  . thiamine  100 mg Intravenous Daily   Continuous Infusions: . heparin 1,400 Units/hr (04/04/12 1724)   PRN Meds:.sodium chloride, acetaminophen, acetaminophen, hydrOXYzine, LORazepam, LORazepam, ondansetron, oxyCODONE-acetaminophen, sodium chloride  Assessment/Plan: Vanessa Palmer is a 62 year old female w pmh of CAD s/p NSTEMI (2004, 2004, 2005), HTN, OSA, and depression presenting with sharp chest pains, likely MSK in nature.   1) B/L DVTs--confirmed by doppler study 2/7.  Suspect possible etiology secondary to sedentary lifestyle and obesity.   -IV heparin -start coumadin -monitor until INR therapeutic, likely will need 3-6 months treatment.  INR today 1.13 -recommend outpatient close monitoring and hypercoagulable work up upon completion of treatment.   2) Atypical chest pain in setting of CAD--likely musculoskeletal but also may be secondary to PE in setting of b/l DVTs.  Presented with 1 day of sharp shooting chest pains now resolved. Presentation not typical for ACS.  Pain was reproducible on palpation of the left chest wall and reported not to be similar to those she experienced with prior MI.  Her EKG was unchanged from prior with no evidence of acute ischemia. Troponin x3 neg. Similar hospital admission, 12/2011, with extensive cardiac workup, echo: EF of 55% to 60% with no wall motion abnormalities and Nuclear stress test showed reduced activity in the anterior wall LV and adjacent  anterolateral and anteroseptal segments.  These findings raised some suspicion for mild inducible ischemia, but were interpreted by Dr Sharyn Lull as false positive in setting of normal echocardiogram.  Dr. Sharyn Lull doubted patient with significant coronary disease, and she was managed medically. Cardiac cath 2004: left circumflex 10-20% proximal and mid stenosis, OM-1 very small. Not candidate for PCI at that time.   Will not get CTA chest at this time in setting of CKD and worsening renal function.  Already started on treatment, and thus would not change management.  Will need to be on treatment for at least 3-6 months and followed up by pcp and receive hypercoagulable work up as an outpatient once completed anticoagulation treatment.  If she develops tachycardia, SOB, tachypnea, and signs and symptoms of  R heart strain while on treatment, we will consult PCCM for thrombolysis treatment for possible massive PE.  - On IV heparin, will start coumadin, monitor until INR therapeutic  - Continue BB, ASA, and statin  - Spoke to Dr. Sharyn Lull on the phone--he will see her as an outpatient, tentatively scheduled for Monday 04/07/12 at 345pm  3) LE edema, shortness of breath--likely secondary to b/l DVTs and possible PE.  Patient reports 2 weeks of worsening LE edema and SOB with exertion. Prior echo in November was with normal EF and no diastolic dysfunction. She did have pitting edema to mid leg bilaterally. CXR showed mild pulmonary vascular congestion without edema, and lung exam without rales. Pro-BNP is mildly elevated at 239.2 (from 154 in 2012).  C/o orthopnea for several years.  Denies PND.  She may have component of mild diastolic dysfunction, although this was not seen on prior echo.  She may benefit from repeat echo by cardiology as outpatient.  Received 1.5L of IVF in ED  - given IV lasix 40mg  (patient lasix naive at home) yesterday, d/c today.    - Consider repeat echo as an outpatient - No IVF  - monitor  I/O: -1.9L/24 hours - f/u mag--replaced  4) EtOH abuse  Patient with history of chronic EtOH abuse. Has h/o presenting to the ED inebriated.  No signs of withdrawal.  Denies recent alcohol use.   - EtOH <11 - CIWA   5) HTN  On metoprolol and losartan at home. BP on admission 106/58.  - Continue home antihypertensives   6) CKD stage III  Cr. 1.46 on admission, variable baseline creatinine range (1.3-2.5 over past year). GFR 44. Follows at [facility] (Dr. Caryn Section).  Trend BMP--monitor Cr  7) Hyperlipidemia  LDL 56 and HDL 41 in November 2013. Not currently on statin therapy.  - Continue statin given new AHA guidelines and pt's high CVD risk   8) Chronic back pain  MRI lumbar spine in 2010 showed disc bulge at L5 nerve root, also w DJD.  - Continue home percocet   9) Urinary Incontinence  Chronic urge incontinence, wears adult diaper at home.  -continue Toviaz   10) Acid Reflux  On protonix at home.  - continue PPI   11) OSA  Says she does not use CPAP at home  -recomment repeat sleep study as outpatient   Diet: Regular DVT Ppx: Heparin IV and Coumadin Dispo: pending further clinical improvement.   The patient does have a current PCP (MULLEN, EMILY, MD), therefore will be requiring OPC follow-up after discharge.  The patient does not have transportation limitations that hinder transportation to clinic appointments.  Services Needed at time of discharge: Y = Yes, Blank = No PT:   OT:   RN:   Equipment:   Other:     LOS: 2 days   Darden Palmer 04/05/2012, 8:46 AM

## 2012-04-05 NOTE — Progress Notes (Signed)
ANTICOAGULATION CONSULT NOTE  Pharmacy Consult for Heparin Indication: DVT  Allergies  Allergen Reactions  . Percocet (Oxycodone-Acetaminophen) Itching    Ok with medicine for itching  . Ace Inhibitors Other (See Comments)     Angioedema  . Hydromorphone Hcl Other (See Comments)    diffuse rash  . Propoxyphene-Acetaminophen Itching  . Vicodin (Hydrocodone-Acetaminophen) Itching    Patient Measurements: Height: 5\' 2"  (157.5 cm) Weight: 270 lb 3.2 oz (122.562 kg) IBW/kg (Calculated) : 50.1 Heparin Dosing Weight: 81 Kg  Vital Signs: BP: 125/85 mmHg (02/07 1932) Pulse Rate: 65 (02/07 1932)  Labs:  Recent Labs  04/03/12 1836 04/03/12 2232 04/04/12 0049 04/04/12 0630 04/04/12 0640 04/04/12 1415 04/04/12 2315  HGB 11.5* 11.5*  --   --  11.3*  --   --   HCT 33.3* 33.7*  --   --  33.0*  --   --   PLT 183 189  --   --  173  --   --   APTT 33  --   --   --   --   --   --   LABPROT 14.3  --   --   --   --   --   --   INR 1.13  --   --   --   --   --   --   HEPARINUNFRC  --   --   --   --   --   --  0.54  CREATININE 1.46* 1.48*  --   --  1.35* 1.34*  --   TROPONINI  --   --  <0.30 <0.30  --   --   --     Estimated Creatinine Clearance: 56.4 ml/min (by C-G formula based on Cr of 1.34).  Assessment: 62 yo female with chest pain for Heparin  Goal of Therapy:  Heparin level 0.3-0.7 units/ml Monitor platelets by anticoagulation protocol: Yes   Plan:  Continue Heparin at current rate Follow-up am labs.  Geannie Risen, PharmD, BCPS

## 2012-04-05 NOTE — Progress Notes (Signed)
UR Completed Linet Brash Graves-Bigelow, RN,BSN [phone]  

## 2012-04-05 NOTE — Progress Notes (Addendum)
ANTICOAGULATION CONSULT NOTE  Pharmacy Consult for Heparin Indication: DVT  Allergies  Allergen Reactions  . Percocet (Oxycodone-Acetaminophen) Itching    Ok with medicine for itching  . Ace Inhibitors Other (See Comments)     Angioedema  . Hydromorphone Hcl Other (See Comments)    diffuse rash  . Propoxyphene-Acetaminophen Itching  . Vicodin (Hydrocodone-Acetaminophen) Itching    Patient Measurements: Height: 5\' 2"  (157.5 cm) Weight: 270 lb 3.2 oz (122.562 kg) IBW/kg (Calculated) : 50.1 Heparin Dosing Weight: 81 Kg  Vital Signs: Temp: 98.1 F (36.7 C) (02/08 0600) BP: 112/74 mmHg (02/08 0600) Pulse Rate: 54 (02/08 0600)  Labs:  Recent Labs  04/03/12 1836 04/03/12 2232 04/04/12 0049 04/04/12 0630 04/04/12 0640 04/04/12 1415 04/04/12 2315 04/05/12 0512  HGB 11.5* 11.5*  --   --  11.3*  --   --  10.9*  HCT 33.3* 33.7*  --   --  33.0*  --   --  31.6*  PLT 183 189  --   --  173  --   --  169  APTT 33  --   --   --   --   --   --   --   LABPROT 14.3  --   --   --   --   --   --   --   INR 1.13  --   --   --   --   --   --   --   HEPARINUNFRC  --   --   --   --   --   --  0.54 0.50  CREATININE 1.46* 1.48*  --   --  1.35* 1.34*  --  1.51*  TROPONINI  --   --  <0.30 <0.30  --   --   --   --     Estimated Creatinine Clearance: 50.1 ml/min (by C-G formula based on Cr of 1.51).  Assessment: 62 yo female with DVT currently therapeutic on heparin infusion.  Platelet count is stable.  H/H down slightly.  Goal of Therapy:  Heparin level 0.3-0.7 units/ml Monitor platelets by anticoagulation protocol: Yes   Plan:  Continue Heparin at current rate Follow-up am labs. Follow-up long term anticoagulation plans (warfarin vs. xarelto)  Celedonio Miyamoto, PharmD, BCPS Clinical Pharmacist Pager [phone]  Addendum:  Asked to start warfarin.  Will give 10mg  today and check daily protimes.

## 2012-04-05 NOTE — Care Management Note (Addendum)
  Page 2 of 2   04/07/2012     11:54:00 AM   CARE MANAGEMENT NOTE 04/07/2012  Patient:  Vanessa Palmer, Vanessa Palmer   Account Number:  [id number]  Date Initiated:  04/04/2012  Documentation initiated by:  DAVIS,TYMEEKA  Subjective/Objective Assessment:   62 yo female admitted with chestpain. PTA pt independent. No PCP.     Action/Plan:   Home when stable   Anticipated DC Date:  04/04/2012   Anticipated DC Plan:  HOME/SELF CARE      DC Planning Services  PCP issues  CM consult  Medication Assistance      [facility] Choice  HOME HEALTH   Choice offered to / List presented to:  C-1 Patient        HH arranged  HH-1 RN      [facility] agency  [facility]   Status of service:  Completed, signed off Medicare Important Message given?   (If response is "NO", the following Medicare IM given date fields will be blank) Date Medicare IM given:   Date Additional Medicare IM given:    Discharge Disposition:  HOME W HOME HEALTH SERVICES  Per UR Regulation:  Reviewed for med. necessity/level of care/duration of stay  If discussed at Long Length of Stay Meetings, dates discussed:    Comments:  04-07-12 31 Vanessa Palmer, [place] [phone] CM did call [facility] and medication xarelto is not available. Pharmacist can order today and have tomorrow. They will deliver Rx's to home. Pt's copay for xarelto will be 3.60. CM did call MD back to make her aware that pt will be without dose tonight. MD stated that pt will get tonight's dose before leaving today. MD aware that Rx will need to be escribed to pharmacy in order for them to order by 2:00 pm today. Pt will have  F/u appointment  in the clinic No further needs from CM at this time.  [name], RN Case Manager Addendum CASE MANAGEMENT Progress Notes Service date: 04/06/2012 12:53 PM NCM spoke to pt and states she uses [facility] # [phone] for her medications. Attempted call to pharmacy and they are closed on Sunday. Pt  states she does not have any copay cost for her medications. They are delivered to her home from [facility]. Pt is scheduled to go home on Xarelto. Will have weekday NCM follow up with [facility] to see when they can deliver her meds post dc. Pt scheduled dc on 04/07/2012 home. She has aide from [employer] with CAP program. Pt will need HH RN, offered choice to pt. Pt agreeable to [facility] for [facility]. Faxed orders, facesheet and H&P to [facility]. Will have weekday NCM fax dc summary at discharge. [facility] contact info added to dc instructions. Isidoro Donning RN CCM Case Mgmt phone [phone]    04-05-12 [address] [place], [place] [phone] Pt  positive for DVT. Initiated on IV heparin. Will continue to monitor for disposition needs.  04/04/12 1400 Tymeeka Davis,RN,BSN [phone]

## 2012-04-05 NOTE — Plan of Care (Signed)
Problem: Consults Goal: Diagnosis - Venous Thromboembolism (VTE) Choose a selection Outcome: Completed/Met Date Met:  04/04/12 DVT (Deep Vein Thrombosis)

## 2012-04-06 LAB — BASIC METABOLIC PANEL
Calcium: 8.4 mg/dL (ref 8.4–10.5)
Creatinine, Ser: 1.57 mg/dL — ABNORMAL HIGH (ref 0.50–1.10)
GFR calc non Af Amer: 35 mL/min — ABNORMAL LOW (ref >90)
Glucose, Bld: 115 mg/dL — ABNORMAL HIGH (ref 70–99)
Sodium: 135 mEq/L (ref 135–145)

## 2012-04-06 LAB — URINALYSIS, ROUTINE W REFLEX MICROSCOPIC
Glucose, UA: NEGATIVE mg/dL
Ketones, ur: NEGATIVE mg/dL
Leukocytes, UA: NEGATIVE
Specific Gravity, Urine: 1.006 (ref 1.005–1.030)
pH: 6 (ref 5.0–8.0)

## 2012-04-06 LAB — CBC
MCH: 30.1 pg (ref 26.0–34.0)
MCHC: 33.8 g/dL (ref 30.0–36.0)
Platelets: 175 10*3/uL (ref 150–400)

## 2012-04-06 LAB — PROTIME-INR: Prothrombin Time: 14.2 seconds (ref 11.6–15.2)

## 2012-04-06 LAB — MAGNESIUM: Magnesium: 1.8 mg/dL (ref 1.5–2.5)

## 2012-04-06 MED ORDER — WARFARIN SODIUM 10 MG PO TABS
10.0000 mg | ORAL_TABLET | Freq: Once | ORAL | Status: DC
  Filled 2012-04-06: qty 1

## 2012-04-06 MED ORDER — RIVAROXABAN 15 MG PO TABS
15.0000 mg | ORAL_TABLET | Freq: Two times a day (BID) | ORAL | Status: DC
  Administered 2012-04-06 – 2012-04-07 (×3): 15 mg via ORAL
  Filled 2012-04-06 (×4): qty 1

## 2012-04-06 MED ORDER — RIVAROXABAN 20 MG PO TABS: 20.0000 mg | ORAL_TABLET | Freq: Every day | ORAL | Status: DC

## 2012-04-06 NOTE — Progress Notes (Addendum)
NCM spoke to pt and states she uses [facility] # [phone] for her medications. Attempted call to pharmacy and they are closed on Sunday. Pt states she does not have any copay cost for her medications. They are delivered to her home from [facility]. Pt is scheduled to go home on Xarelto. Will have weekday NCM follow up with [facility] to see when they can deliver her meds post dc. Pt scheduled dc on 04/07/2012 home. She has aide from [employer] with CAP program. Pt will need HH RN, offered choice to pt. Pt agreeable to [facility] for [facility]. Faxed orders, facesheet and H&P to [facility]. Will have weekday NCM fax dc summary at discharge. [facility] contact info added to dc instructions. Isidoro Donning RN CCM Case Mgmt phone [phone]

## 2012-04-06 NOTE — Progress Notes (Signed)
ANTICOAGULATION CONSULT NOTE  Pharmacy Consult for Heparin Indication: DVT  Allergies  Allergen Reactions  . Percocet (Oxycodone-Acetaminophen) Itching    Ok with medicine for itching  . Ace Inhibitors Other (See Comments)     Angioedema  . Hydromorphone Hcl Other (See Comments)    diffuse rash  . Propoxyphene-Acetaminophen Itching  . Vicodin (Hydrocodone-Acetaminophen) Itching    Patient Measurements: Height: 5\' 2"  (157.5 cm) Weight: 270 lb 3.2 oz (122.562 kg) IBW/kg (Calculated) : 50.1 Heparin Dosing Weight: 81 Kg  Vital Signs: Temp: 97.7 F (36.5 C) (02/09 0600) BP: 125/87 mmHg (02/09 0600) Pulse Rate: 61 (02/09 0600)  Labs:  Recent Labs  04/03/12 1836  04/04/12 0049 04/04/12 0630 04/04/12 0640 04/04/12 1415 04/04/12 2315 04/05/12 0512 04/06/12 0435  HGB 11.5*  < >  --   --  11.3*  --   --  10.9* 10.7*  HCT 33.3*  < >  --   --  33.0*  --   --  31.6* 31.7*  PLT 183  < >  --   --  173  --   --  169 175  APTT 33  --   --   --   --   --   --   --   --   LABPROT 14.3  --   --   --   --   --   --   --  14.2  INR 1.13  --   --   --   --   --   --   --  1.11  HEPARINUNFRC  --   --   --   --   --   --  0.54 0.50 0.63  CREATININE 1.46*  < >  --   --  1.35* 1.34*  --  1.51* 1.57*  TROPONINI  --   --  <0.30 <0.30  --   --   --   --   --   < > = values in this interval not displayed.  Estimated Creatinine Clearance: 48.2 ml/min (by C-G formula based on Cr of 1.57).  Assessment: 62 yo female with DVT currently therapeutic on heparin infusion.  Coumadin started yesterday, day #2/5 minimum overlap. Heparin level on higher side of goal, will reduce rate slightly, possible drug accumulation d/t ckd.  Goal of Therapy:  Heparin level 0.3-0.7 units/ml Monitor platelets by anticoagulation protocol: Yes INR=2-3   Plan:  Decrease heparin to 1350 units/hr and repeat coumadin 10mg  today. F/u daily protime and heparin level.  Verlene Mayer, PharmD, BCPS Pager  [phone]

## 2012-04-06 NOTE — Progress Notes (Addendum)
Subjective: Vanessa Palmer was seen and examined at bedside.  Found to have b/l DVTs on doppler study 2/7 and started on IV heparin.  She denies any current chest pain or shortness of breath but continues to have b/l lower extremity pain that is slightly improved.  She has no other complaints at this time.  Denies any fever, chills, chest pain, shortness of breath, N/V/D, headaches, abdominal pain, or any urinary complaints at this time.    Objective: Vital signs in last 24 hours: Filed Vitals:   04/05/12 1400 04/05/12 1911 04/05/12 2105 04/06/12 0600  BP:  113/81 105/70 125/87  Pulse: 61 65 82 61  Temp: 97.9 F (36.6 C) 98.1 F (36.7 C)  97.7 F (36.5 C)  TempSrc:  Oral    Resp: 20 20  16   Height:      Weight:      SpO2:  94%  95%   Weight change:   Intake/Output Summary (Last 24 hours) at 04/06/12 1023 Last data filed at 04/06/12 0816  Gross per 24 hour  Intake 766.83 ml  Output   1850 ml  Net -1083.17 ml   Vitals reviewed. General: sleeping HEENT: PERRLA, EOMI, no scleral icterus  Chest: non-tender to palpation Cardiac: RRR, 2/6 murmur LSB. No JVD appreciated  Pulm: clear to auscultation bilaterally, no wheezes, rales, or rhonchi appreciated  Abd: soft, obese, nontender, nondistended, BS present  Ext: 1+ pretibial edema, tender to palpation b/l lower extremities up to knee Neuro: alert and oriented X3, cranial nerves II-XII grossly intact, strength and sensation to light touch equal in bilateral upper and lower extremities  Lab Results: Basic Metabolic Panel:  Recent Labs Lab 04/05/12 0512 04/06/12 0435  NA 136 135  K 3.7 4.0  CL 103 102  CO2 25 22  GLUCOSE 99 115*  BUN 29* 27*  CREATININE 1.51* 1.57*  CALCIUM 8.4 8.4  MG 1.7 1.8  PHOS 3.3  --    Liver Function Tests:  Recent Labs Lab 04/03/12 1836  AST 19  ALT 10  ALKPHOS 110  BILITOT 0.6  PROT 7.4  ALBUMIN 3.4*   CBC:  Recent Labs Lab 04/05/12 0512 04/06/12 0435  WBC 5.1 5.0  HGB 10.9*  10.7*  HCT 31.6* 31.7*  MCV 90.0 89.3  PLT 169 175   Cardiac Enzymes:  Recent Labs Lab 04/04/12 0049 04/04/12 0630  TROPONINI <0.30 <0.30   BNP:  Recent Labs Lab 04/03/12 2055  PROBNP 239.2*   D-Dimer:  Recent Labs Lab 04/03/12 1836  DDIMER 0.30   CBG:  Recent Labs Lab 04/03/12 2223  GLUCAP 90   Coagulation:  Recent Labs Lab 04/03/12 1836 04/06/12 0435  LABPROT 14.3 14.2  INR 1.13 1.11   Urine Drug Screen: Drugs of Abuse     Component Value Date/Time   LABOPIA NEG 02/11/2012 0959   LABOPIA NONE DETECTED 12/30/2011 2221   COCAINSCRNUR NEG 02/11/2012 0959   COCAINSCRNUR NONE DETECTED 12/30/2011 2221   LABBENZ NEG 02/11/2012 0959   LABBENZ NONE DETECTED 12/30/2011 2221   LABBENZ POSITIVE* 08/10/2010 1710   AMPHETMU NONE DETECTED 12/30/2011 2221   AMPHETMU NEGATIVE 08/10/2010 1710   THCU NONE DETECTED 12/30/2011 2221   LABBARB NEG 02/11/2012 0959   LABBARB NONE DETECTED 12/30/2011 2221    Alcohol Level:  Recent Labs Lab 04/03/12 2150  ETH <11   Medications: I have reviewed the patient's current medications. Scheduled Meds: . amLODipine  10 mg Oral Daily  . aspirin EC  81 mg Oral Daily  .  buPROPion  150 mg Oral BH-q7a  . fesoterodine  8 mg Oral Daily  . fluticasone  2 spray Each Nare Daily  . folic acid  1 mg Oral Daily  . losartan  100 mg Oral BID  . metoprolol  100 mg Oral BID  . multivitamin with minerals  1 tablet Oral Daily  . pantoprazole  40 mg Oral Daily  . simvastatin  20 mg Oral q1800  . sodium chloride  3 mL Intravenous Q12H  . thiamine  100 mg Oral Daily   Or  . thiamine  100 mg Intravenous Daily  . Warfarin - Pharmacist Dosing Inpatient   Does not apply q1800   Continuous Infusions: . heparin 1,400 Units/hr (04/05/12 1854)   PRN Meds:.sodium chloride, acetaminophen, acetaminophen, hydrOXYzine, LORazepam, LORazepam, magnesium hydroxide, ondansetron, oxyCODONE-acetaminophen, sodium chloride  Assessment/Plan: Vanessa Palmer is a 62  year old female w pmh of CAD s/p NSTEMI (2004, 2004, 2005), HTN, OSA, and depression presenting with sharp chest pains, likely MSK in nature.   1) B/L DVTs--confirmed by doppler study 2/7.  Suspect possible etiology secondary to sedentary lifestyle and obesity.   -IV heparin--would like to transition to lovenox, however pt extremely terrified of needles and compliance outside of the hospital would be difficult.  She is trying to see if a family member can assist with the injections outside.  We are also waiting to see if Xarelto may be an affordable option for her.   -start coumadin -monitor until INR therapeutic, likely Palmer need 3-6 months treatment.  INR today 1.11 -recommend outpatient close monitoring and hypercoagulable work up upon completion of treatment.   Addendum: Discussed with Isidoro Donning, Case Manager, who confirmed that Vanessa Palmer has Medicare which covers Xarelto with $3 co-pay.  [name] call the pharmacy and confirm when medications may be delivered.  As such, we Palmer proceed with transition to Xarelto from IV heparin and coumadin today with pharmacy assistance.     2) Atypical chest pain in setting of CAD--likely musculoskeletal but also may be secondary to PE in setting of b/l DVTs.  Presented with 1 day of sharp shooting chest pains now resolved. Presentation not typical for ACS.  Pain reproducible on palpation of the left chest wall.  Her EKG was unchanged from prior with no evidence of acute ischemia. Troponin x3 neg. Similar hospital admission, 12/2011, with extensive cardiac workup, echo: EF of 55% to 60% with no wall motion abnormalities and Nuclear stress test showed reduced activity in the anterior wall LV and adjacent anterolateral and anteroseptal segments.  These findings raised some suspicion for mild inducible ischemia, but were interpreted by Dr Sharyn Lull as false positive in setting of normal echocardiogram.  Dr. Sharyn Lull doubted patient with significant coronary  disease, and she was managed medically. Cardiac cath 2004: left circumflex 10-20% proximal and mid stenosis, OM-1 very small. Not candidate for PCI at that time.   Palmer not get CTA chest at this time in setting of CKD and worsening renal function.  Already started on treatment, and thus would not change management.  Palmer need to be on treatment for at least 3-6 months and followed up by pcp and receive hypercoagulable work up as an outpatient once completed anticoagulation treatment.  If she develops tachycardia, SOB, tachypnea, and signs and symptoms of R heart strain while on treatment, we Palmer consult PCCM for thrombolysis treatment for possible massive PE.  - On IV heparin, Palmer start coumadin, monitor until INR therapeutic  - Continue BB,  ASA -d/c statin in setting of likely  No history of ischemic event and non-obstructive CAD. Last LDL wnl 12/2011.  Palmer defer to cardiology as an outpatient if needed to resume.     - Spoke to Dr. Sharyn Lull on the phone--he Palmer see her as an outpatient, tentatively scheduled for Monday 04/07/12 at 345pm  3) LE edema, shortness of breath--likely secondary to b/l DVTs and possible PE.  Presented with 2 weeks of worsening LE edema and SOB with exertion. Prior echo in November with normal EF and no diastolic dysfunction.  CXR showed mild pulmonary vascular congestion without edema, and lung exam without rales. Pro-BNP is mildly elevated at 239.2 (from 154 in 2012).  C/o orthopnea for several years.  Denies PND.  She may have component of mild diastolic dysfunction, although this was not seen on prior echo.  She may benefit from repeat echo by cardiology as outpatient.  Received 1.5L of IVF in ED  - off lasix    - Consider repeat echo as an outpatient - No IVF  - monitor I/O: -1.5L/24 hours  4) EtOH abuse  Patient with history of chronic EtOH abuse. Has h/o presenting to the ED inebriated.  No signs of withdrawal.  Denies recent alcohol use.   - EtOH <11 - CIWA    5) HTN  On metoprolol and losartan at home. BP on admission 106/58.  - Continue home antihypertensives   6) CKD stage III --trending up.  ?pre-renal etiology vs ATN.    Cr. 1.46 on admission, variable baseline creatinine range (1.3-2.5 over past year). GFR 44. Follows at [facility] (Dr. Caryn Section).  Trend BMP--monitor Cr -FeUrea -U/A -d/c statin  7) Hyperlipidemia  LDL 56 and HDL 41 in November 2013. Was not on statin therapy.  - Discontinue statin given new AHA guidelines and pt's high CVD risk   8) Chronic back pain  MRI lumbar spine in 2010 showed disc bulge at L5 nerve root, also w DJD.  - Continue home percocet   9) Urinary Incontinence  Chronic urge incontinence, wears adult diaper at home.  -continue Toviaz   10) Acid Reflux  On protonix at home.  - continue PPI   11) OSA  Says she does not use CPAP at home  -recomment repeat sleep study as outpatient   Diet: Regular DVT Ppx: Heparin IV and Coumadin Dispo: pending further clinical improvement.   The patient does have a current PCP (MULLEN, EMILY, MD), therefore Palmer be requiring OPC follow-up after discharge.  The patient does not have transportation limitations that hinder transportation to clinic appointments.  Services Needed at time of discharge: Y = Yes, Blank = No PT:   OT:   RN:   Equipment:   Other:     LOS: 3 days   Darden Palmer 04/06/2012, 10:23 AM

## 2012-04-07 DIAGNOSIS — J309 Allergic rhinitis, unspecified: Secondary | ICD-10-CM

## 2012-04-07 DIAGNOSIS — R109 Unspecified abdominal pain: Secondary | ICD-10-CM

## 2012-04-07 LAB — BASIC METABOLIC PANEL
BUN: 25 mg/dL — ABNORMAL HIGH (ref 6–23)
CO2: 25 mEq/L (ref 19–32)
Chloride: 101 mEq/L (ref 96–112)
Glucose, Bld: 101 mg/dL — ABNORMAL HIGH (ref 70–99)
Potassium: 4.6 mEq/L (ref 3.5–5.1)
Sodium: 134 mEq/L — ABNORMAL LOW (ref 135–145)

## 2012-04-07 LAB — CBC
HCT: 34.5 % — ABNORMAL LOW (ref 36.0–46.0)
Hemoglobin: 11.7 g/dL — ABNORMAL LOW (ref 12.0–15.0)
MCHC: 33.9 g/dL (ref 30.0–36.0)
RBC: 3.85 MIL/uL — ABNORMAL LOW (ref 3.87–5.11)
WBC: 3.8 10*3/uL — ABNORMAL LOW (ref 4.0–10.5)

## 2012-04-07 LAB — PROTIME-INR: INR: 1.52 — ABNORMAL HIGH (ref 0.00–1.49)

## 2012-04-07 MED ORDER — RIVAROXABAN 20 MG PO TABS: 20.0000 mg | ORAL_TABLET | Freq: Every day | ORAL | Status: DC

## 2012-04-07 MED ORDER — RIVAROXABAN 15 MG PO TABS: 15.0000 mg | ORAL_TABLET | Freq: Two times a day (BID) | ORAL | Status: DC

## 2012-04-07 NOTE — Progress Notes (Addendum)
Subjective: Vanessa Palmer was seen and examined at bedside.  Found to have b/l DVTs on doppler study 2/7 and started on IV heparin coumadin and then transitioned to xarelto yesterday.  She denies any current chest pain or shortness of breath but continues to have b/l lower extremity pain that is slightly improved.  She has no other complaints at this time.  Denies any fever, chills, chest pain, shortness of breath, N/V/D, headaches, abdominal pain, or any urinary complaints at this time.    Objective: Vital signs in last 24 hours: Filed Vitals:   04/06/12 0600 04/06/12 2051 04/06/12 2100 04/07/12 0600  BP: 125/87 110/74 115/77 102/66  Pulse: 61  63 57  Temp: 97.7 F (36.5 C)  98.5 F (36.9 C) 98.3 F (36.8 C)  TempSrc:      Resp: 16  18 18   Height:      Weight:    278 lb 3.2 oz (126.191 kg)  SpO2: 95%  97% 99%   Weight change:   Intake/Output Summary (Last 24 hours) at 04/07/12 0856 Last data filed at 04/07/12 0700  Gross per 24 hour  Intake    240 ml  Output   3450 ml  Net  -3210 ml   Vitals reviewed. General: sleeping HEENT: PERRLA, EOMI, no scleral icterus  Chest: non-tender to palpation Cardiac: RRR, 2/6 murmur LSB. No JVD appreciated  Pulm: clear to auscultation bilaterally, no wheezes, rales, or rhonchi appreciated  Abd: soft, obese, nontender, nondistended, BS present  Ext: 1+ pretibial edema, tender to palpation b/l lower extremities up to knee Neuro: alert and oriented X3, cranial nerves II-XII grossly intact, strength and sensation to light touch equal in bilateral upper and lower extremities  Lab Results: Basic Metabolic Panel:  Recent Labs Lab 04/05/12 0512 04/06/12 0435 04/07/12 0500  NA 136 135 134*  K 3.7 4.0 4.6  CL 103 102 101  CO2 25 22 25   GLUCOSE 99 115* 101*  BUN 29* 27* 25*  CREATININE 1.51* 1.57* 1.53*  CALCIUM 8.4 8.4 9.0  MG 1.7 1.8  --   PHOS 3.3  --   --    Liver Function Tests:  Recent Labs Lab 04/03/12 1836  AST 19  ALT 10   ALKPHOS 110  BILITOT 0.6  PROT 7.4  ALBUMIN 3.4*   CBC:  Recent Labs Lab 04/06/12 0435 04/07/12 0500  WBC 5.0 3.8*  HGB 10.7* 11.7*  HCT 31.7* 34.5*  MCV 89.3 89.6  PLT 175 188   Cardiac Enzymes:  Recent Labs Lab 04/04/12 0049 04/04/12 0630  TROPONINI <0.30 <0.30   BNP:  Recent Labs Lab 04/03/12 2055  PROBNP 239.2*   D-Dimer:  Recent Labs Lab 04/03/12 1836  DDIMER 0.30   CBG:  Recent Labs Lab 04/03/12 2223  GLUCAP 90   Coagulation:  Recent Labs Lab 04/03/12 1836 04/06/12 0435 04/07/12 0500  LABPROT 14.3 14.2 17.9*  INR 1.13 1.11 1.52*   Urine Drug Screen: Drugs of Abuse     Component Value Date/Time   LABOPIA NEG 02/11/2012 0959   LABOPIA NONE DETECTED 12/30/2011 2221   COCAINSCRNUR NEG 02/11/2012 0959   COCAINSCRNUR NONE DETECTED 12/30/2011 2221   LABBENZ NEG 02/11/2012 0959   LABBENZ NONE DETECTED 12/30/2011 2221   LABBENZ POSITIVE* 08/10/2010 1710   AMPHETMU NONE DETECTED 12/30/2011 2221   AMPHETMU NEGATIVE 08/10/2010 1710   THCU NONE DETECTED 12/30/2011 2221   LABBARB NEG 02/11/2012 0959   LABBARB NONE DETECTED 12/30/2011 2221    Alcohol Level:  Recent Labs Lab 04/03/12 2150  ETH <11   Medications: I have reviewed the patient's current medications. Scheduled Meds: . amLODipine  10 mg Oral Daily  . aspirin EC  81 mg Oral Daily  . buPROPion  150 mg Oral BH-q7a  . fesoterodine  8 mg Oral Daily  . fluticasone  2 spray Each Nare Daily  . folic acid  1 mg Oral Daily  . losartan  100 mg Oral BID  . metoprolol  100 mg Oral BID  . multivitamin with minerals  1 tablet Oral Daily  . pantoprazole  40 mg Oral Daily  . rivaroxaban  15 mg Oral BID WC   Followed by  . [START ON 04/27/2012] rivaroxaban  20 mg Oral Q supper  . sodium chloride  3 mL Intravenous Q12H  . thiamine  100 mg Oral Daily   Or  . thiamine  100 mg Intravenous Daily   Continuous Infusions:   PRN Meds:.sodium chloride, acetaminophen, acetaminophen, hydrOXYzine,  magnesium hydroxide, ondansetron, oxyCODONE-acetaminophen, sodium chloride  Assessment/Plan: Vanessa Palmer is a 62 year old female w pmh of CAD s/p NSTEMI (2004, 2004, 2005), HTN, OSA, and depression presenting with sharp chest pains, likely MSK in nature.   1) B/L DVTs--confirmed by doppler study 2/7.  Suspect possible etiology secondary to sedentary lifestyle and obesity.   -continue xarelto--case management confirmed that pharmacy can deliver xarelto to patient tomorrow.  She will receive her 2nd dose in the hospital today and resume xarelto at home starting tomorrow.  i also called the pharmacy, [facility] and they confirmed that they HAVE received the prescription for Xarelto and will deliver to patient tomorrow.   -discontinue coumadin -monitor until INR therapeutic, likely will need 3-6 months treatment.  INR today 1.52 -recommend outpatient close monitoring and hypercoagulable work up upon completion of treatment.   2) Atypical chest pain in setting of CAD--likely musculoskeletal but also may be secondary to PE in setting of b/l DVTs.  Presented with 1 day of sharp shooting chest pains now resolved. Presentation not typical for ACS.  Pain reproducible on palpation of the left chest wall.  Her EKG was unchanged from prior with no evidence of acute ischemia. Troponin x3 neg. Similar hospital admission, 12/2011, with extensive cardiac workup, echo: EF of 55% to 60% with no wall motion abnormalities and Nuclear stress test showed reduced activity in the anterior wall LV and adjacent anterolateral and anteroseptal segments.  These findings raised some suspicion for mild inducible ischemia, but were interpreted by Dr Sharyn Lull as false positive in setting of normal echocardiogram.  Dr. Sharyn Lull doubted patient with significant coronary disease, and she was managed medically. Cardiac cath 2004: left circumflex 10-20% proximal and mid stenosis, OM-1 very small. Not candidate for PCI at that time.   Will not  get CTA chest at this time in setting of CKD and worsening renal function.  Already started on treatment, and thus would not change management.  Will need to be on treatment for at least 3-6 months and followed up by pcp and receive hypercoagulable work up as an outpatient once completed anticoagulation treatment.  If she develops tachycardia, SOB, tachypnea, and signs and symptoms of R heart strain while on treatment, we will consult PCCM for thrombolysis treatment for possible massive PE.  - On xarelto, monitor until INR therapeutic  - Continue BB - d/c ASA Per Dr. Sharyn Lull, no significant CAD, on xarelto now - statin discontinued in setting of likely  No history of ischemic event  and non-obstructive CAD. Last LDL wnl 12/2011.  Will defer to cardiology as an outpatient if needed to resume.     - Spoke to Dr. Sharyn Lull on the phone--he will see her as an outpatient, Thursday 04/10/12  at 345pm  3) LE edema, shortness of breath--likely secondary to b/l DVTs and possible PE.  Presented with 2 weeks of worsening LE edema and SOB with exertion. Prior echo in November with normal EF and no diastolic dysfunction.  CXR showed mild pulmonary vascular congestion without edema, and lung exam without rales. Pro-BNP is mildly elevated at 239.2 (from 154 in 2012).  C/o orthopnea for several years.  Denies PND.  She may have component of mild diastolic dysfunction, although this was not seen on prior echo.  She may benefit from repeat echo by cardiology as outpatient.  Received 1.5L of IVF in ED  - off lasix    - Consider repeat echo as an outpatient - No IVF  - monitor I/O: -2.7L/24 hours  4) EtOH abuse  Patient with history of chronic EtOH abuse. Has h/o presenting to the ED inebriated.  No signs of withdrawal.  Denies recent alcohol use.   - EtOH <11 - CIWA   5) HTN  On metoprolol and losartan at home. BP on admission 106/58.  - Continue home antihypertensives   6) CKD stage III --trending down.  ?pre-renal  etiology vs ATN.    Cr. 1.46 on admission, variable baseline creatinine range (1.3-2.5 over past year). GFR 44. Follows at [facility] (Dr. Caryn Section).  Trend BMP--monitor Cr -FeUrea: urea:257, urine cr 40.57-->3.43% -U/A negative -d/c statin  7) Hyperlipidemia  LDL 56 and HDL 41 in November 2013. Was not on statin therapy.  - Discontinue statin given new AHA guidelines and pt's high CVD risk   8) Chronic back pain  MRI lumbar spine in 2010 showed disc bulge at L5 nerve root, also w DJD.  - Continue home percocet   9) Urinary Incontinence  Chronic urge incontinence, wears adult diaper at home.  -continue Toviaz   10) Acid Reflux  On protonix at home.  - continue PPI   11) OSA  Says she does not use CPAP at home  -recomment repeat sleep study as outpatient   Diet: Regular DVT Ppx: xarelto Dispo: d/c home today AFTER LAST DOSE OF XARELTO TODAY, pending case management communication with pharmacy for xarelto  The patient does have a current PCP (MULLEN, EMILY, MD), therefore will be requiring OPC follow-up after discharge.  The patient does not have transportation limitations that hinder transportation to clinic appointments.  Services Needed at time of discharge: Y = Yes, Blank = No PT:   OT:   RN:   Equipment:   Other:     LOS: 4 days   Vanessa Palmer 04/07/2012, 8:56 AM  Internal Medicine Teaching Service Attending Note Date: 04/07/2012  Patient name: Vanessa Palmer  Medical record number: [id number]  Date of birth: [birth year]    This patient has been seen and discussed with the house staff. Please see their note for complete details. I concur with their findings with the following additions/corrections:  Pt doing well and can go home on xarelto. Will ensure she is going to be able to have these meds filled and available to her. We will coordinate with case management.  Vanessa Palmer 04/07/2012, 12:54 PM

## 2012-04-07 NOTE — Progress Notes (Signed)
Clinical Social Work Department BRIEF PSYCHOSOCIAL ASSESSMENT 04/07/2012  Patient:  Vanessa Palmer, Vanessa Palmer     Account Number:  [id number]     Admit date:  04/03/2012  Clinical Social Worker:  Robin Searing  Date/Time:  04/07/2012 12:40 PM  Referred by:  Physician  Date Referred:  04/03/2012 Referred for  Other - See comment   Other Referral:   Order for ETOH and self neglect assessment   Interview type:  Patient Other interview type:    PSYCHOSOCIAL DATA Living Status:  ALONE Admitted from facility:   Level of care:   Primary support name:  Tima- granddaughter Primary support relationship to patient:  FAMILY Degree of support available:   good    CURRENT CONCERNS Current Concerns  Other - See comment   Other Concerns:   MD ordered consult for etoh and self neglect    SOCIAL WORK ASSESSMENT / PLAN met with patient who tells me she lives in [place] apartment setting for older adults and disabled adults on limited income- she receives a disability check and is on Mediciad and food stamps per her report. Patient uses Medicaid transportation to get to/from medical appointments and has family that assists her with getting meds, groceries, etc. She reprorts that she has an CNA aide who comes 5 days week (12:30-3:30) to assist her chores, laundry, etc. Her CNA also lives in [place] and will come assist her if an emergent need arises at other times-    She also reports she keeps a 44 yo great grandchild during the day-  Patient denies any drug or alcohol use/abuse- she denies any cocerns or problems at home-   Assessment/plan status:  Other - See comment Other assessment/ plan:   No CSW needs or concerns identified- I do not see any concerns for self neglect and she appears to have adequate assistance available as needed-   Information/referral to community resources:   DSS  Medicaid transportation    PATIENT'S/FAMILY'S RESPONSE TO PLAN OF CARE: Patient very pleasant  and open with CSW- She is eager to get back home and has no concerns or further needs at this time.      Patient did voice concern related to a broken wheelchair- I will ask RNCM to look into this for her- Reece Levy, MSW, [employer] [phone]

## 2012-04-07 NOTE — Discharge Summary (Signed)
Internal Medicine Teaching [facility] Discharge Note  Name: Vanessa Palmer MRN: [id number] DOB: 1950-12-15 62 y.o.  Date of Admission: 04/03/2012  6:02 PM Date of Discharge: 04/07/2012 Attending Physician: Vanessa Hiss, MD  Discharge Diagnosis: Principal Problem:   DVT of lower extremity, bilateral Active Problems:   CORONARY ARTERY DISEASE   ALCOHOL ABUSE   HYPERLIPIDEMIA   SLEEP APNEA, OBSTRUCTIVE   Chest pain, atypical  Discharge Medications:   Medication List    STOP taking these medications       aspirin EC 81 MG tablet     ondansetron 4 MG/5ML solution  Commonly known as:  ZOFRAN      TAKE these medications       acetaminophen 325 MG tablet  Commonly known as:  TYLENOL  Take 2 tablets (650 mg total) by mouth every 4 (four) hours as needed for pain.     amLODipine 10 MG tablet  Commonly known as:  NORVASC  Take 10 mg by mouth daily.     buPROPion 150 MG 24 hr tablet  Commonly known as:  WELLBUTRIN XL  Take 1 tablet (150 mg total) by mouth every morning.     fluticasone 50 MCG/ACT nasal spray  Commonly known as:  FLONASE  Place 2 sprays into the nose daily.     hydrOXYzine 10 MG tablet  Commonly known as:  ATARAX/VISTARIL  Take 1 tablet (10 mg total) by mouth 3 (three) times daily as needed for itching.     losartan 100 MG tablet  Commonly known as:  COZAAR  Take 1 tablet (100 mg total) by mouth 2 (two) times daily.     metoprolol 50 MG tablet  Commonly known as:  LOPRESSOR  Take 2 tablets (100 mg total) by mouth 2 (two) times daily.     nitroGLYCERIN 0.3 MG SL tablet  Commonly known as:  NITROSTAT  Place 0.3 mg under the tongue every 5 (five) minutes as needed. For chest pain     omeprazole 40 MG capsule  Commonly known as:  PRILOSEC  Take 40 mg by mouth 2 (two) times daily.     oxyCODONE-acetaminophen 5-325 MG per tablet  Commonly known as:  ROXICET  Take 1 tablet by mouth every 8 (eight) hours as needed for pain (for chronic  joint pain).     Rivaroxaban 15 MG Tabs tablet  Commonly known as:  XARELTO  Take 1 tablet (15 mg total) by mouth 2 (two) times daily with a meal.     Rivaroxaban 20 MG Tabs  Commonly known as:  XARELTO  Take 1 tablet (20 mg total) by mouth daily with supper.  Start taking on:  04/27/2012     TOVIAZ 8 MG Tb24  Generic drug:  fesoterodine  Take 8 mg by mouth Daily.       Disposition and follow-up:   Vanessa Palmer was discharged from [facility] in stable condition.  At the hospital follow up visit please address: B/L DVTs--on Xarelto, subtherapeutic on discharge, follow up INR Atypical chest pain--Follow up with Dr. Sharyn Palmer OSA--consider sleep study  Follow-up Appointments: Follow-up Information   Follow up with Vanessa Pane, MD On 04/10/2012. (@345pm )    Contact information:   [address] [place] [address] [phone]       Follow up with Vanessa Meiers, MD On 04/16/2012. (@10am )    Contact information:   [address] [place] [address] [phone]  Follow up with [facility]. [facility])    Contact information:   [phone]     Discharge Orders   Future Appointments Provider Department Dept Phone   04/16/2012 10:00 AM Mathis Dad, MD [facility] [phone]   Future Orders Complete By Expires     Call MD for:  difficulty breathing, headache or visual disturbances  As directed     Call MD for:  redness, tenderness, or signs of infection (pain, swelling, redness, odor or green/yellow discharge around incision site)  As directed     Call MD for:  severe uncontrolled pain  As directed     Diet - low sodium heart healthy  As directed     Increase activity slowly  As directed       Consultations:    Procedures Performed:  Dg Chest Portable 1 View  04/03/2012  *RADIOLOGY REPORT*  Clinical Data: Chest pain, history hypertension  PORTABLE CHEST - 1 VIEW  Comparison:  12/30/2011; 12/19/2010  Findings:  Grossly unchanged enlarged cardiac silhouette and mediastinal contours.  Worsening bibasilar opacities, right greater than left, favored to represent atelectasis.  Mild cephalization of flow without frank evidence of edema.  No definite pleural effusion or pneumothorax.  Grossly unchanged bones.  IMPRESSION: 1.  Mild pulmonary venous congestion without frank evidence of edema. 2.  Decreased lung volumes with worsening bibasilar opacities, right greater than left, favored to represent atelectasis.  Further evaluation with a PA and lateral chest radiograph may be obtained as clinically indicated.   Original Report Authenticated By: Tacey Ruiz, MD    Mm Digital Screening  03/24/2012  *RADIOLOGY REPORT*  Clinical Data: Screening.  DIGITAL BILATERAL SCREENING MAMMOGRAM WITH CAD  Comparison:  Previous exams.  FINDINGS:  ACR Breast Density Category 2: There is a scattered fibroglandular pattern.  No suspicious masses, architectural distortion, or calcifications are present.  Images were processed with CAD.  IMPRESSION: No mammographic evidence of malignancy.  A result letter of this screening mammogram will be mailed directly to the patient.  RECOMMENDATION: Screening mammogram in one year. (Code:SM-B-01Y)  BI-RADS CATEGORY 1:  Negative.   Original Report Authenticated By: Baird Lyons, M.D.    Admission HPI: [name] is a 62 year old female w pmh of CAD s/p NSTEMI (2004, 2004, 2005), HTN, OSA, and depression presenting with sharp chest pains for about 24 hours.  [name] reports that she noticed sharp, shooting chest pains last night at home. She describes pains as located over left sternum and left anterior chest. Pains wax and wane and are 10 of 10 at worst severity. She does have history of prior MI, but says that symptoms today are different in that presentation when she experienced chest pressure, pain in arms or neck, or diaphoresis.  In the ED, she received  nitroglycerin patch which she says that her pain from a 10 to an 8. At the time of our interview, she denied any current pain.  She also reports a week or 2 of increasing lower extremity edema and shortness of breath. She has no diagnosis of CHF. At baseline, she sleeps on several pillows at home. She has noted no change in orthopnea and denies PND. She is noticed increasing shortness of breath with ambulation. She denies any dizziness or syncope.  Had an episode of nausea and vomiting last night. Denies fevers, chills, myalgias, or malaise.  Hospital Course by problem list:   DVT of lower extremity, bilateral--presented with complaints  of lower extremity swelling and pain.  Confirmed by b/l venous duplex study on 2/7: DVT right and left femoral veins, right and left popliteal veins, and left posterior tibial vein.  Started on IV heparin initially, then started coumadin as well.  Discussed with case manager if insurance could cover Xarelto as Ms. Glomski is extremely terrified of needles and did not think she would inject herself with lovenox.  Xarelto is covered by her insurance, Medicare, and was transitioned to Xarelto and heparin and coumadin were discontinued prior to discharge in hospital course.  INR on discharge still subtherapeutic.  Will be discharged home on 21 days of 15mg  Xarelto PO BID and then transition to 20mg  daily per pharmacy.  Follow up with pcp, will likely need to be on treatment for 3-6 months and consider hypercoagulable work up once completed treatment.       Atypical chest pain in setting of CORONARY ARTERY DISEASE--likely musculoskeletal but also may be secondary to PE in setting of b/l DVTs. Presented with 1 day of sharp shooting chest pains now resolved. Presentation not typical for ACS. Pain reproducible on palpation of the left chest wall. Her EKG was unchanged from prior with no evidence of acute ischemia. Troponin x3 neg. Similar hospital admission, 12/2011, with extensive  cardiac workup, echo: EF of 55% to 60% with no wall motion abnormalities and Nuclear stress test showed reduced activity in the anterior wall LV and adjacent anterolateral and anteroseptal segments. These findings raised some suspicion for mild inducible ischemia, but were interpreted by Dr Vanessa Palmer as false positive in setting of normal echocardiogram. Dr. Sharyn Palmer doubted patient with significant coronary disease, and she was managed medically. Cardiac cath 2004: left circumflex 10-20% proximal and mid stenosis, OM-1 very small. Not candidate for PCI at that time.  Did not get CTA chest this admission in setting of CKD and worsening renal function and already started on anticoagulation treatment, and thus would not change management. She will need to be on treatment for at least 3-6 months and followed up by pcp and receive hypercoagulable work up as an outpatient once completed anticoagulation treatment. She was monitored for tachycardia, SOB, tachypnea, and signs and symptoms of R heart strain while on treatment in case of possible signs of massive PE but she remained stable during her course. She was continued on home beta blocker and aspirin.  Statin was discontinued due to no history of ischemic event and non-obstructive CAD and non elevated last LDL.  Dicussed her case with Dr. Sharyn Palmer who will see her as an outpatient after discharge later this week.      ALCOHOL ABUSE--history of chronic EtOH abuse. Has h/o presenting to the ED inebriated. No signs of withdrawal this admission. Denies recent alcohol use. EtOH <11.  Placed on CIWA but no ativan given.      SLEEP APNEA, OBSTRUCTIVE--denies using CPAP at home and not on CPAP this admission.  Recommend outpatient sleep study.    Discharge Vitals:  BP 102/66  Pulse 57  Temp(Src) 98.3 F (36.8 C) (Oral)  Resp 18  Ht 5\' 2"  (1.575 m)  Wt 278 lb 3.2 oz (126.191 kg)  BMI 50.87 kg/m2  SpO2 99%  Discharge Labs:  Results for orders placed during the  hospital encounter of 04/03/12 (from the past 24 hour(s))  CBC     Status: Abnormal   Collection Time    04/07/12  5:00 AM      Result Value Range   WBC 3.8 (*) 4.0 -  10.5 K/uL   RBC 3.85 (*) 3.87 - 5.11 MIL/uL   Hemoglobin 11.7 (*) 12.0 - 15.0 g/dL   HCT 65.7 (*) 84.6 - 96.2 %   MCV 89.6  78.0 - 100.0 fL   MCH 30.4  26.0 - 34.0 pg   MCHC 33.9  30.0 - 36.0 g/dL   RDW 95.2  84.1 - 32.4 %   Platelets 188  150 - 400 K/uL  BASIC METABOLIC PANEL     Status: Abnormal   Collection Time    04/07/12  5:00 AM      Result Value Range   Sodium 134 (*) 135 - 145 mEq/L   Potassium 4.6  3.5 - 5.1 mEq/L   Chloride 101  96 - 112 mEq/L   CO2 25  19 - 32 mEq/L   Glucose, Bld 101 (*) 70 - 99 mg/dL   BUN 25 (*) 6 - 23 mg/dL   Creatinine, Ser 4.01 (*) 0.50 - 1.10 mg/dL   Calcium 9.0  8.4 - 02.7 mg/dL   GFR calc non Af Amer 36 (*) >90 mL/min   GFR calc Af Amer 42 (*) >90 mL/min  PROTIME-INR     Status: Abnormal   Collection Time    04/07/12  5:00 AM      Result Value Range   Prothrombin Time 17.9 (*) 11.6 - 15.2 seconds   INR 1.52 (*) 0.00 - 1.49   Signed: Darden Palmer 04/07/2012, 1:35 PM   Time Spent on Discharge: 35 minutes Services Ordered on Discharge: home health RN Equipment Ordered on Discharge: ?wheelchair--her's may be broken

## 2012-04-16 ENCOUNTER — Ambulatory Visit (INDEPENDENT_AMBULATORY_CARE_PROVIDER_SITE_OTHER): Payer: Medicare Other | Admitting: Internal Medicine

## 2012-04-16 ENCOUNTER — Other Ambulatory Visit: Payer: Self-pay | Admitting: *Deleted

## 2012-04-16 ENCOUNTER — Encounter: Payer: Self-pay | Admitting: Internal Medicine

## 2012-04-16 VITALS — BP 121/78 | HR 60 | Temp 98.1°F | Ht 62.0 in | Wt 269.3 lb

## 2012-04-16 DIAGNOSIS — M1990 Unspecified osteoarthritis, unspecified site: Secondary | ICD-10-CM

## 2012-04-16 DIAGNOSIS — G4733 Obstructive sleep apnea (adult) (pediatric): Secondary | ICD-10-CM

## 2012-04-16 DIAGNOSIS — I82409 Acute embolism and thrombosis of unspecified deep veins of unspecified lower extremity: Secondary | ICD-10-CM

## 2012-04-16 DIAGNOSIS — I10 Essential (primary) hypertension: Secondary | ICD-10-CM | POA: Diagnosis not present

## 2012-04-16 DIAGNOSIS — I82403 Acute embolism and thrombosis of unspecified deep veins of lower extremity, bilateral: Secondary | ICD-10-CM

## 2012-04-16 MED ORDER — OXYCODONE-ACETAMINOPHEN 5-325 MG PO TABS: 1.0000 | ORAL_TABLET | Freq: Three times a day (TID) | ORAL | Status: DC | PRN

## 2012-04-16 NOTE — Progress Notes (Signed)
Patient ID: Vanessa Palmer, female   DOB: 04/22/1950, 62 y.o.   MRN: [id number] HPI:[name] is a 62 year old woman with past medical history of hypertension, nonischemic CAD, OSA, CKD, who was recently admitted from February 6 through February 10 for unprovoked bilateral DVTs. She was initially started on heparin and warfarin but and was transitioned to Xarelto because of her fear of needles to inject Lovenox.  She was sent home with 21 days of Xarelto 15mg  twice a day but now she said that her insurance company sent her a letter in the mail saying that they would not pay for this medication long-term. Since hospital discharge, she has no chest pain. Follow up with dr Sharyn Lull on Friday.  She does have Mild SOB with walking about one block. She does have obstructive sleep apnea and does have a CPAP machine at home but lost the "card".   She states that she had 2 sleep studies in the past. No smoking, No family or personal history of blood clots, no long car ride or immobility, recent surgeries, cancers.    ROS: as per HPI  PE: General: alert, well-developed, and cooperative to examination.  Lungs: normal respiratory effort, no accessory muscle use, normal breath sounds, no crackles, and no wheezes.limited exam due to to large body habitus Heart: normal rate, regular rhythm, no murmur, no gallop, and no rub.  Abdomen: obese,soft, non-tender, normal bowel sounds, no distention, no guarding, no rebound tenderness Neurologic: nonfocal Skin: turgor normal and no rashes.  EXT: +2 pitting edema on LE. No tenderness to palpation of both bilateral lower extremity. Psych: flat

## 2012-04-16 NOTE — Assessment & Plan Note (Signed)
Patient states that she does have a CPAP machine at home but lost the "card". We will try to contact [facility] to see if they can replace the missing part.

## 2012-04-16 NOTE — Patient Instructions (Addendum)
Will need to schedule an appointment with Dr. Alexandria Lodge in 1-2 weeks for Coumadin Continue taking your Xarelto as prescribed Follow up with your primary care physician in 2-3 months

## 2012-04-16 NOTE — Assessment & Plan Note (Signed)
BP Readings from Last 3 Encounters:  04/16/12 121/78  04/07/12 116/77  02/11/12 125/80    Lab Results  Component Value Date   NA 134* 04/07/2012   K 4.6 04/07/2012   CREATININE 1.53* 04/07/2012    Assessment:  Blood pressure control:  well controlled  Progress toward BP goal:   yes   Plan:  Medications: continue current meds  Educational resources provided: brochure  Self management tools provided: home blood pressure logbook

## 2012-04-16 NOTE — Assessment & Plan Note (Signed)
Currently on Xarelto but patient states that her insurance will not pay for Xarelto long-term.  I discussed with patient that she needs to be on anticoagulations for at least 3-6 months and then need to have  a discussion with her primary care doctor to determine if she needs to continue and/or hypercoagulable workup. At discharge her INR was 1.5 which was subtherapeutic. She has been compliant with her Xarelto 15mg  milligrams twice a day.she still has about 2 weeks left of Xarelto. -Will check INR today -Have patient schedule an appointment with Dr. Alexandria Lodge in 1-2 weeks to start Coumadin

## 2012-04-16 NOTE — Addendum Note (Signed)
Addended by: Carrolyn Meiers T on: 04/16/2012 10:55 AM   Modules accepted: Orders

## 2012-04-18 ENCOUNTER — Telehealth: Payer: Self-pay | Admitting: Pharmacist

## 2012-04-18 DIAGNOSIS — I10 Essential (primary) hypertension: Secondary | ICD-10-CM | POA: Diagnosis not present

## 2012-04-18 NOTE — Telephone Encounter (Signed)
Patient had received and brought to [facility] a prior authorization requirement form from her insurer/payor:  [employer] Plans/issued through [employer]. Patient HAD been dispensed her initial number of Xarelto 15mg  tablets (#40) in permit her to be treated at 15mg  BID x 21 days before switching to Xarelto 20mg  QD for 6-12 months as per [employer] package insert FDA approved labeling for treatment of bilateral DVT. I called on behalf of patient seeking her prior authorization for her Xarelto 20mg  QD tablets that she will need commencing on day 22 of her treatment course. I spoke with Natalia Leatherwood at [phone] at 0950h Friday 21-FEB-14 who after being given the patients DOB, ICD-9 code for this diagnosis and Dr. Donnelly Stager NPI number. Natalia Leatherwood then indicated that the patient was "prior-authorization APPROVED for Xarelto 20mg  QD through April 18, 2013. Reference #[id number].  A Faxed document will be sent by payor to our clinic validating this as well. The [facility]'s fax number was provided.

## 2012-04-24 ENCOUNTER — Telehealth: Payer: Self-pay | Admitting: *Deleted

## 2012-04-24 NOTE — Telephone Encounter (Signed)
Call from Nyoka Lint, RN  with [employer]. # [phone] Today on assessment nurse reports cough, lungs little diminished.  Cough for few days. Taking mucinex OTC but nurse is requesting Z pac for pt.  It is hard for her to come into clinic. Denies fever. VS stable.  Talked with Dr Criselda Peaches and she wants pt to be evaluated before antibiotics are given.  RN informed and pt called  Pt not at home message left and [facility] will f/u with pt.

## 2012-04-25 ENCOUNTER — Encounter: Payer: Self-pay | Admitting: Internal Medicine

## 2012-04-25 ENCOUNTER — Ambulatory Visit (HOSPITAL_COMMUNITY)
Admission: RE | Admit: 2012-04-25 | Discharge: 2012-04-25 | Disposition: A | Discharge status: Home / Self Care | Payer: Medicare Other | Source: Ambulatory Visit | Attending: Internal Medicine | Admitting: Internal Medicine

## 2012-04-25 ENCOUNTER — Telehealth: Payer: Self-pay | Admitting: Internal Medicine

## 2012-04-25 ENCOUNTER — Ambulatory Visit (INDEPENDENT_AMBULATORY_CARE_PROVIDER_SITE_OTHER): Payer: Medicare Other | Admitting: Internal Medicine

## 2012-04-25 VITALS — BP 130/90 | HR 60 | Temp 97.0°F | Ht 62.0 in | Wt 265.4 lb

## 2012-04-25 DIAGNOSIS — J209 Acute bronchitis, unspecified: Secondary | ICD-10-CM

## 2012-04-25 DIAGNOSIS — R059 Cough, unspecified: Secondary | ICD-10-CM | POA: Diagnosis not present

## 2012-04-25 DIAGNOSIS — R079 Chest pain, unspecified: Secondary | ICD-10-CM | POA: Insufficient documentation

## 2012-04-25 DIAGNOSIS — R05 Cough: Secondary | ICD-10-CM | POA: Diagnosis not present

## 2012-04-25 MED ORDER — BENZONATATE 100 MG PO CAPS: 100.0000 mg | ORAL_CAPSULE | Freq: Four times a day (QID) | ORAL | Status: DC | PRN

## 2012-04-25 NOTE — Progress Notes (Signed)
Patient ID: Vanessa Palmer, female   DOB: August 20, 1950, 62 y.o.   MRN: [id number]  Subjective:   Patient ID: Vanessa Palmer female   DOB: 01/12/1951 62 y.o.   MRN: [id number]  HPI: [name] is a 62 y.o. with an extensive past medical history as outlined below, presents with a history of a cough for one week.  The cough is productive of clear sputum, associated with central chest pain exacerbated by coughing, however, she denies history of fevers or chills. She does not have difficulty in breathing. She has no history of sick contact. She denies increased mucus production from the nose, sneezing, headaches, myalgias, facial pain, however, she reports earache, without ear drainage or hearing deficit. She has no sore throat. She has tried over-the-counter cough remedies without relief. Her appetite is normal. No vomiting or nausea. Her bowel movements are good.  Please see the A&P for the status of the pt's chronic medical problems.  Past Medical History  Diagnosis Date  . Hyperlipidemia   . Hypertension   . Gout   . Obesity   . Depression   . DJD (degenerative joint disease)   . OSA (obstructive sleep apnea)     previously used CPAP, has lost  . CAD (coronary artery disease)     s/p non-Q wave MI in 06/2001 and 2004, 2005  . ALCOHOL ABUSE 12/13/2005    Annotation: Sober since 11/06 Qualifier: Diagnosis of  By: Wallace Cullens MD, Natalia Leatherwood    . INTRINSIC ASTHMA, WITH EXACERBATION 09/27/2009    Qualifier: Diagnosis of  By: Denton Meek MD, Tillie Rung    . Chronic kidney disease (CKD), stage III (moderate) 09/19/2010  . CHF (congestive heart failure)    Current Outpatient Prescriptions  Medication Sig Dispense Refill  . acetaminophen (TYLENOL) 325 MG tablet Take 2 tablets (650 mg total) by mouth every 4 (four) hours as needed for pain.  100 tablet  2  . amLODipine (NORVASC) 10 MG tablet Take 10 mg by mouth daily.      Marland Kitchen buPROPion (WELLBUTRIN XL) 150 MG 24 hr tablet Take 1 tablet (150 mg total) by  mouth every morning.  90 tablet  2  . fluticasone (FLONASE) 50 MCG/ACT nasal spray Place 2 sprays into the nose daily.  16 g  6  . hydrOXYzine (ATARAX/VISTARIL) 10 MG tablet Take 1 tablet (10 mg total) by mouth 3 (three) times daily as needed for itching.  30 tablet  1  . losartan (COZAAR) 100 MG tablet Take 1 tablet (100 mg total) by mouth 2 (two) times daily.  60 tablet  3  . metoprolol (LOPRESSOR) 50 MG tablet Take 2 tablets (100 mg total) by mouth 2 (two) times daily.  120 tablet  3  . nitroGLYCERIN (NITROSTAT) 0.3 MG SL tablet Place 0.3 mg under the tongue every 5 (five) minutes as needed. For chest pain      . omeprazole (PRILOSEC) 40 MG capsule Take 40 mg by mouth 2 (two) times daily.      Marland Kitchen oxyCODONE-acetaminophen (ROXICET) 5-325 MG per tablet Take 1 tablet by mouth every 8 (eight) hours as needed for pain (for chronic joint pain).  120 tablet  0  . Rivaroxaban (XARELTO) 15 MG TABS tablet Take 1 tablet (15 mg total) by mouth 2 (two) times daily with a meal.  30 tablet  40  . [START ON 04/27/2012] Rivaroxaban (XARELTO) 20 MG TABS Take 1 tablet (20 mg total) by mouth daily with supper.  30 tablet  3  .  TOVIAZ 8 MG TB24 Take 8 mg by mouth Daily.      . benzonatate (TESSALON PERLES) 100 MG capsule Take 1 capsule (100 mg total) by mouth every 6 (six) hours as needed for cough.  30 capsule  1   No current facility-administered medications for this visit.   Family History  Problem Relation Age of Onset  . Mental illness Mother   . Heart disease Father   . Diabetes Sister   . Diabetes Sister   . Diabetes Sister    History   Social History  . Marital Status: Single    Spouse Name: N/A    Number of Children: 3  . Years of Education: GED   Occupational History  . retired     previously worked as a Hospital doctor   Social History Main Topics  . Smoking status: Never Smoker   . Smokeless tobacco: None  . Alcohol Use: Yes  . Drug Use: No  . Sexually Active: None   Other Topics  Concern  . None   Social History Narrative   Lives alone here in [place].         Review of Systems: Constitutional: Denies fever, chills, diaphoresis, appetite change and fatigue.  HEENT: Denies photophobia, eye pain, redness, hearing loss, ear pain, congestion, sore throat, rhinorrhea, sneezing, mouth sores, trouble swallowing, neck pain, neck stiffness and tinnitus.   Respiratory: Denies SOB, DOE, chest tightness,  and wheezing.   Cardiovascular: Denies chest pain, palpitations and leg swelling.  Gastrointestinal: Denies nausea, vomiting, abdominal pain, diarrhea, constipation, blood in stool and abdominal distention.  Genitourinary: Denies dysuria, urgency, frequency, hematuria, flank pain and difficulty urinating.  Musculoskeletal: Denies myalgias, back pain, joint swelling, arthralgias and gait problem.  Skin: Denies pallor, rash and wound.  Neurological: Denies dizziness, seizures, syncope, weakness, light-headedness, numbness and headaches.  Hematological: Denies adenopathy. Easy bruising, personal or family bleeding history  Psychiatric/Behavioral: Denies suicidal ideation, mood changes, confusion, nervousness, sleep disturbance and agitation  Objective:  Physical Exam: Filed Vitals:   04/25/12 1058  BP: 130/90  Pulse: 60  Temp: 97 F (36.1 C)  TempSrc: Oral  Height: 5\' 2"  (1.575 m)  Weight: 265 lb 6.4 oz (120.385 kg)  SpO2: 98%   Constitutional: Vital signs reviewed.  Patient is a well-developed and well-nourished in no acute distress and cooperative with exam. Alert and oriented x3.  Head: Normocephalic and atraumatic Ear: TM normal bilaterally Mouth: no erythema or exudates, MMM. She reports pain on the left ear as I introduce the otoscope. However, the external auditory canal has no signs of infection, like erythema, or swelling or warmth. Pulling on the tragus also produces some pain.  Eyes: PERRL, EOMI, conjunctivae normal, No scleral icterus.  Neck: Supple,  Trachea midline normal ROM, No JVD, mass, thyromegaly, or carotid bruit present.  Cardiovascular: RRR, S1 normal, S2 normal, no MRG, pulses symmetric and intact bilaterally Pulmonary/Chest: CTAB, no wheezes, rales, or rhonchi Abdominal: Soft. Non-tender, non-distended, bowel sounds are normal, no masses, organomegaly, or guarding present.  GU: no CVA tenderness Musculoskeletal: No joint deformities, erythema, or stiffness, ROM full and no nontender Hematology: no cervical, inginal, or axillary adenopathy.  Neurological: A&O x3, Strength is normal and symmetric bilaterally, cranial nerve II-XII are grossly intact, no focal motor deficit, sensory intact to light touch bilaterally.  Skin: Warm, dry and intact. No rash, cyanosis, or clubbing.  Psychiatric: Normal mood and affect. speech and behavior is normal. Judgment and thought content normal. Cognition and memory are normal.  Assessment & Plan:  I have discussed my assessment, and plan for the care of this patient with Dr. Kem Kays. Please see my problem based charting.  In brief, Ms. Underdown will get a chest x-ray today and I will prescribe Tessalon for symptomatic relief. I believe this is an acute bronchitis as opposed to a pneumonia, but would still need to exclude a pneumonia.

## 2012-04-25 NOTE — Assessment & Plan Note (Signed)
Clinical history and physical examination is more consistent with acute bronchitis as opposed to a community-acquired pneumonia. However, given this patient's comorbidities, including obesity, obstructive sleep apnea, and a recent hospitalization I would still go ahead and order a chest x-ray to exclude any CAP.  Plan. - 2 view chest x-ray. -Tessalon for symptomatic relief. -She does not need any antibiotics at this point.

## 2012-04-25 NOTE — Patient Instructions (Addendum)
General Instructions: We will do chest x ray to assess for a pneumonia  Please take Tessalon for your cough for now I will call you back regarding you results of the chest ray. Please call the clinic if your symptoms do not improve in 5 days or if you develop shortness of breath, or fever, or increased fatigue    Treatment Goals:  Goals (1 Years of Data) as of 04/25/12   None      Progress Toward Treatment Goals:    Self Care Goals & Plans:  Self Care Goal 04/25/2012  Manage my medications take my medicines as prescribed; bring my medications to every visit; refill my medications on time  Monitor my health -  Eat healthy foods -       Care Management & Community Referrals:

## 2012-04-28 ENCOUNTER — Ambulatory Visit: Payer: Medicare Other | Admitting: Pharmacist

## 2012-04-28 NOTE — Patient Instructions (Signed)
Patient instructed to take rivaroxaban/Xarelto 20mg  by mouth daily as instructed by her physicians. She states she has this medication (i.e. She has had her prescription filled).  Patient verbalized understanding of these instructions.

## 2012-04-28 NOTE — Progress Notes (Signed)
Patient has transitioned OFF of her rivaroxaban/Xarelto 15mg  PO BID dose after 21 days---TO rivaroxaban/Xarelto 20mg  PO ONCE DAILY. Patient indicates she has her NEW PRESCRIPTION VIAL with the 20mg  strength rivaroxaban/Xarelto tablets and understands she is to take ONE TABLET BY MOUTH DAILY. She verbalizes understanding of calling the anticoagulation clinic or her PCP in the event of any signs or symptoms of bleeding as well as any worsening pain/discomfort to her lower extremities or any chest pain, shortness of breath, etc.

## 2012-05-01 NOTE — Telephone Encounter (Signed)
error 

## 2012-05-12 ENCOUNTER — Other Ambulatory Visit: Payer: Self-pay | Admitting: *Deleted

## 2012-05-12 DIAGNOSIS — M1990 Unspecified osteoarthritis, unspecified site: Secondary | ICD-10-CM

## 2012-05-12 MED ORDER — OXYCODONE-ACETAMINOPHEN 5-325 MG PO TABS: 1.0000 | ORAL_TABLET | Freq: Three times a day (TID) | ORAL | Status: DC | PRN

## 2012-05-12 NOTE — Telephone Encounter (Signed)
Last filled 2/19 Call when ready @ [phone]

## 2012-05-14 NOTE — Telephone Encounter (Signed)
Pt informed Rx is ready 

## 2012-05-15 ENCOUNTER — Other Ambulatory Visit: Payer: Self-pay | Admitting: Internal Medicine

## 2012-05-15 MED ORDER — OXYCODONE-ACETAMINOPHEN 5-325 MG PO TABS: 1.0000 | ORAL_TABLET | Freq: Two times a day (BID) | ORAL | Status: DC | PRN

## 2012-06-16 ENCOUNTER — Ambulatory Visit (INDEPENDENT_AMBULATORY_CARE_PROVIDER_SITE_OTHER): Payer: Medicare Other | Admitting: Internal Medicine

## 2012-06-16 ENCOUNTER — Other Ambulatory Visit: Payer: Self-pay | Admitting: Internal Medicine

## 2012-06-16 ENCOUNTER — Encounter: Payer: Self-pay | Admitting: Internal Medicine

## 2012-06-16 ENCOUNTER — Encounter: Payer: Self-pay | Admitting: *Deleted

## 2012-06-16 ENCOUNTER — Telehealth: Payer: Self-pay | Admitting: *Deleted

## 2012-06-16 VITALS — BP 127/83 | HR 60 | Temp 96.8°F | Ht 62.0 in | Wt 265.8 lb

## 2012-06-16 DIAGNOSIS — E785 Hyperlipidemia, unspecified: Secondary | ICD-10-CM | POA: Diagnosis not present

## 2012-06-16 DIAGNOSIS — N183 Chronic kidney disease, stage 3 unspecified: Secondary | ICD-10-CM | POA: Diagnosis not present

## 2012-06-16 DIAGNOSIS — M109 Gout, unspecified: Secondary | ICD-10-CM | POA: Diagnosis not present

## 2012-06-16 DIAGNOSIS — K219 Gastro-esophageal reflux disease without esophagitis: Secondary | ICD-10-CM

## 2012-06-16 DIAGNOSIS — I10 Essential (primary) hypertension: Secondary | ICD-10-CM | POA: Diagnosis not present

## 2012-06-16 DIAGNOSIS — G4733 Obstructive sleep apnea (adult) (pediatric): Secondary | ICD-10-CM | POA: Diagnosis not present

## 2012-06-16 DIAGNOSIS — Z0000 Encounter for general adult medical examination without abnormal findings: Secondary | ICD-10-CM | POA: Diagnosis not present

## 2012-06-16 DIAGNOSIS — M1990 Unspecified osteoarthritis, unspecified site: Secondary | ICD-10-CM

## 2012-06-16 MED ORDER — ZOLPIDEM TARTRATE ER 6.25 MG PO TBCR: 6.2500 mg | EXTENDED_RELEASE_TABLET | Freq: Every evening | ORAL | Status: DC | PRN

## 2012-06-16 MED ORDER — OXYCODONE-ACETAMINOPHEN 5-325 MG PO TABS: 1.0000 | ORAL_TABLET | Freq: Two times a day (BID) | ORAL | Status: DC | PRN

## 2012-06-16 NOTE — Patient Instructions (Addendum)
General Instructions: Please schedule a follow up visit within the next 3 months.   For your medications:   Please bring all of your pill  Bottles with you to each visit.  This will help make sure that we have an up to date list of all the medications you are taking.  Please also bring any over the counter herbal medications you are taking (not including advil, tylenol, etc.)  Please continue taking losartan, amlodipine, metoprolol, xarelto, bupropion, Percocet, omeprazole, toviaz.   You will have a new prescription for ER Ambien for sleep.  Take this medication at bedtime; do not take it if you missed a dose and you wake up in the middle of the night as it will increase daytime sleepiness.  Because this is a long acting medication, be sure to take it at a reasonable bed time.  Also, the first time you take it, make sure you have plenty of time to get 8 hours of sleep and have minimal activities the next day.  If it makes you groggy/sleepy during the day, stop taking the medication and call the clinic.  If you develop dizziness while on the medication, please call the clinic.  If you notice any change in your symptoms of chest pain, SOB on this medication, please stop the medication and call the clinic.  This medication also may cause some psychiatric problems including anxiety and abnormal dreams.  DO NOT take more than one tablet in a day.   Please call with any questions!   Thank you!    Treatment Goals:  Goals (1 Years of Data) as of 06/16/12   None      Progress Toward Treatment Goals:  Treatment Goal 06/16/2012  Blood pressure at goal    Self Care Goals & Plans:  Self Care Goal 06/16/2012  Manage my medications take my medicines as prescribed; bring my medications to every visit; refill my medications on time  Monitor my health -  Eat healthy foods drink diet soda or water instead of juice or soda; eat more vegetables; eat foods that are low in salt  Be physically active -        Care Management & Community Referrals:  Referral 06/16/2012  Referrals made for care management support none needed

## 2012-06-16 NOTE — Telephone Encounter (Signed)
Pt calls and states insurance will not pay for zolpidem cr, it may pay for regular zolpidem, please use [facility]

## 2012-06-16 NOTE — Progress Notes (Signed)
Subjective:    Patient ID: Vanessa Palmer, female    DOB: 1950/05/30, 62 y.o.   MRN: [id number]  Follow up  HPI  Vanessa Palmer is a 62yo woman with PMH of CAD, HTN, HLD, Obesity and CKD stage III.    Vanessa Palmer was recently diagnosed with LE DVT and is on xarelto.  She continues to have mild swelling in her bilateral extremities and some pain with palpation and walking, however, it is improving.  She is taking xarelto as prescribed.  We discussed the nature of her clot and the expected time for it to resolve.   Vanessa Palmer also reports that she has been having trouble sleeping for the past few months.  She notes that she usually goes to bed around 10pm.  She turns off the lights and sleeps in a cool dark room.  She will sometimes have trouble falling asleep, however, the main issue is that she wakes up after about 6 hours and cannot fall back asleep. She will get up and do some chores until about 6am and then "doze" for another couple of hours.  She does have daytime sleepiness but she "gets through it."  She does not take a daytime nap.  She is not driving, but does find that if she is sitting still for a prolonged period of time taht she could fall asleep.  She has not tried anything to help with this issue.  She does not take any caffeine.  Her other chronic issues are stable.  She does have chronic pain, controlled with narcotics.  Her BP is well controled as are her gout, CKD and HLD.  She has not had a gout attack in many years.  She has had most of her health maintenance exams completed.   She brought in the following medications for review: Losartan, amlodipine, metoprolol, xarelto, bupropion, omeprazole, toviaz, percocet.  She cannot remember if she is still taking lasix or tylenol.   Review of Systems  Constitutional: Positive for fatigue. Negative for fever, chills, activity change and appetite change.  HENT: Negative for hearing loss, ear pain and sore throat.   Eyes: Negative for  pain and visual disturbance.  Respiratory: Negative for cough, shortness of breath and wheezing.        Previous bronchitis is resolved.   Cardiovascular: Positive for leg swelling. Negative for chest pain and palpitations.  Gastrointestinal: Negative for nausea, vomiting, abdominal pain, diarrhea, constipation and abdominal distention.  Genitourinary: Negative for dysuria, frequency and difficulty urinating.  Musculoskeletal: Positive for arthralgias and gait problem. Negative for joint swelling.  Skin: Negative for pallor and rash.  Neurological: Negative for dizziness, syncope, light-headedness and headaches.  Psychiatric/Behavioral: Negative for confusion and decreased concentration.       Objective:   Physical Exam  Constitutional: She is oriented to person, place, and time. She appears well-developed and well-nourished. No distress.  HENT:  Head: Normocephalic and atraumatic.  Eyes: EOM are normal. No scleral icterus.  Cardiovascular: Normal rate, regular rhythm and normal heart sounds.   No murmur heard. Pulmonary/Chest: Effort normal and breath sounds normal. No respiratory distress.  Abdominal: Soft. Bowel sounds are normal.  Musculoskeletal: She exhibits edema and tenderness.  + minimal edema to bilateral LE, ttp on the LLE.  No erythema or warmth  Neurological: She is alert and oriented to person, place, and time.  Skin: Skin is warm and dry. No erythema.  Psychiatric: She has a normal mood and affect. Her behavior is normal.  Assessment & Plan:  RTC in 3 months

## 2012-06-17 NOTE — Assessment & Plan Note (Signed)
Stable, continue to monitor for any change, BMET yearly or twice yearly as needed.

## 2012-06-17 NOTE — Assessment & Plan Note (Signed)
No attacks in many years.  She is not currently on any therapy for gout.  If she should develop another attack, would consider starting allopurinol, but not in the acute attack.  She will need to avoid inciting medications.

## 2012-06-17 NOTE — Assessment & Plan Note (Signed)
LDL 56 in 12/2011.  She is not currently being treated with a lipid lowering medication, so I am not sure how she obtained this diagnosis.  She has only had 1 elevated reading, 144 in 2012.  Will check one more time in 2014.  If continues to be normal, will take off problem list.

## 2012-06-17 NOTE — Assessment & Plan Note (Signed)
BP Readings from Last 3 Encounters:  06/16/12 127/83  04/25/12 130/90  04/16/12 121/78    Lab Results  Component Value Date   NA 134* 04/07/2012   K 4.6 04/07/2012   CREATININE 1.53* 04/07/2012    Assessment: Blood pressure control: controlled Progress toward BP goal:  at goal Comments: continue good control to decrease chance of worsening kidney disease  Plan: Medications:  continue current medications Educational resources provided: brochure Self management tools provided: home blood pressure logbook Other plans: no further plans, check BMET at next visit.

## 2012-06-17 NOTE — Assessment & Plan Note (Signed)
She also has insomnia.  I had considered giving her Ambien CR as her sleep latency seems to be the problem and not sleep onset, however, her insurance will not cover this medication. Considering she also has sleep apnea, she may need to see a sleep specialist.  Will call and discuss with the patient and assess whether she is being treated adequately for her sleep apnea prior to starting a medication.

## 2012-06-17 NOTE — Assessment & Plan Note (Signed)
MMG: 03/21/12 - negative PAP: s/p hysterectomy - 11/24/2008 - Pap negative, also noted 2005 negative Flu - refused  Colonoscopy - Discuss at next visit if time amenable Tetanus - 09/19/10  Dexa - at 62yo Pneumonia - at 67

## 2012-06-17 NOTE — Assessment & Plan Note (Signed)
Well controlled with omeprazole. 

## 2012-06-18 ENCOUNTER — Telehealth: Payer: Self-pay | Admitting: *Deleted

## 2012-06-18 NOTE — Telephone Encounter (Signed)
Pt called and stated Ambien CR cost $90.  She can't afford that and wanted you to call in something else. I called pharmacy and it's only $20 for #30 of plain Ambien. [employer]

## 2012-06-25 ENCOUNTER — Other Ambulatory Visit: Payer: Self-pay | Admitting: Internal Medicine

## 2012-06-26 ENCOUNTER — Other Ambulatory Visit: Payer: Self-pay | Admitting: *Deleted

## 2012-06-26 DIAGNOSIS — I10 Essential (primary) hypertension: Secondary | ICD-10-CM

## 2012-06-26 MED ORDER — LOSARTAN POTASSIUM 100 MG PO TABS: 100.0000 mg | ORAL_TABLET | Freq: Two times a day (BID) | ORAL | Status: DC

## 2012-06-26 NOTE — Telephone Encounter (Signed)
Please refill, pt is out of med

## 2012-07-11 ENCOUNTER — Other Ambulatory Visit: Payer: Self-pay | Admitting: *Deleted

## 2012-07-11 DIAGNOSIS — M1990 Unspecified osteoarthritis, unspecified site: Secondary | ICD-10-CM

## 2012-07-11 MED ORDER — OXYCODONE-ACETAMINOPHEN 5-325 MG PO TABS: 1.0000 | ORAL_TABLET | Freq: Two times a day (BID) | ORAL | Status: DC | PRN

## 2012-07-11 NOTE — Telephone Encounter (Signed)
Last filled 4/21  Not due until next Wednesday.

## 2012-07-16 ENCOUNTER — Ambulatory Visit (INDEPENDENT_AMBULATORY_CARE_PROVIDER_SITE_OTHER): Payer: Medicare Other | Admitting: Internal Medicine

## 2012-07-16 ENCOUNTER — Encounter: Payer: Self-pay | Admitting: Internal Medicine

## 2012-07-16 ENCOUNTER — Other Ambulatory Visit: Payer: Self-pay | Admitting: Internal Medicine

## 2012-07-16 VITALS — BP 138/76 | HR 59 | Temp 97.9°F | Ht 62.0 in | Wt 267.0 lb

## 2012-07-16 DIAGNOSIS — L740 Miliaria rubra: Secondary | ICD-10-CM

## 2012-07-16 DIAGNOSIS — L743 Miliaria, unspecified: Secondary | ICD-10-CM | POA: Insufficient documentation

## 2012-07-16 DIAGNOSIS — M1990 Unspecified osteoarthritis, unspecified site: Secondary | ICD-10-CM

## 2012-07-16 DIAGNOSIS — T148XXA Other injury of unspecified body region, initial encounter: Secondary | ICD-10-CM | POA: Insufficient documentation

## 2012-07-16 MED ORDER — OXYCODONE-ACETAMINOPHEN 5-325 MG PO TABS: 1.0000 | ORAL_TABLET | Freq: Two times a day (BID) | ORAL | Status: DC | PRN

## 2012-07-16 MED ORDER — CALAMINE EX LOTN: TOPICAL_LOTION | CUTANEOUS | Status: DC | PRN

## 2012-07-16 NOTE — Assessment & Plan Note (Signed)
Small discoloration noted on patient's right forearm. Patient is currently taking Xarelto.  Not concerning at this point. Recommended to watch for increasing in size, pain or change in color.

## 2012-07-16 NOTE — Progress Notes (Signed)
Subjective:   Patient ID: Vanessa Palmer female   DOB: 1950-11-09 62 y.o.   MRN: [id number]  HPI: Vanessa Palmer is a 62 y.o. female with past history significant as outlined below who presented to the clinic with a bruise since 2 days on right arm and a rash on her neck. 1.Rash on neck: it always comes on during summer time. It feels "bumpy" and is very pruritic. It occurs only at the neck area. Denies any use of new detergent, parfume, lotions. Denies any fevers or chill, mass, inject bites  2. Bruise: she noticed it 2 days ago. It feels sore. Denies any trauma, lifting heavy things. Never had experienced this before.   Patient did not bring her medication with her.    Past Medical History  Diagnosis Date  . Hyperlipidemia   . Hypertension   . Gout   . Obesity   . Depression   . DJD (degenerative joint disease)   . OSA (obstructive sleep apnea)     previously used CPAP, has lost  . CAD (coronary artery disease)     s/p non-Q wave MI in 06/2001 and 2004, 2005  . ALCOHOL ABUSE 12/13/2005    Annotation: Sober since 11/06 Qualifier: Diagnosis of  By: Wallace Cullens MD, Natalia Leatherwood    . INTRINSIC ASTHMA, WITH EXACERBATION 09/27/2009    Qualifier: Diagnosis of  By: Denton Meek MD, Tillie Rung    . Chronic kidney disease (CKD), stage III (moderate) 09/19/2010  . CHF (congestive heart failure)    Current Outpatient Prescriptions  Medication Sig Dispense Refill  . amLODipine (NORVASC) 10 MG tablet Take 10 mg by mouth daily.      Marland Kitchen buPROPion (WELLBUTRIN XL) 150 MG 24 hr tablet Take 1 tablet (150 mg total) by mouth every morning.  90 tablet  2  . fluticasone (FLONASE) 50 MCG/ACT nasal spray INSTILL TWO   SPRAYS IN EACH NOSTRIL ONCE DAILY  16 g  2  . losartan (COZAAR) 100 MG tablet Take 1 tablet (100 mg total) by mouth 2 (two) times daily.  60 tablet  3  . metoprolol (LOPRESSOR) 50 MG tablet Take 2 tablets (100 mg total) by mouth 2 (two) times daily.  120 tablet  3  . nitroGLYCERIN (NITROSTAT) 0.3 MG SL  tablet Place 0.3 mg under the tongue every 5 (five) minutes as needed. For chest pain      . omeprazole (PRILOSEC) 40 MG capsule Take 40 mg by mouth 2 (two) times daily.      Marland Kitchen oxyCODONE-acetaminophen (ROXICET) 5-325 MG per tablet Take 1-2 tablets by mouth every 12 (twelve) hours as needed for pain (for chronic joint pain).  120 tablet  0  . Rivaroxaban (XARELTO) 20 MG TABS Take 1 tablet (20 mg total) by mouth daily with supper.  30 tablet  3  . TOVIAZ 8 MG TB24 Take 8 mg by mouth Daily.      Marland Kitchen zolpidem (AMBIEN CR) 6.25 MG CR tablet Take 1 tablet (6.25 mg total) by mouth at bedtime as needed for sleep.  15 tablet  0   No current facility-administered medications for this visit.   Family History  Problem Relation Age of Onset  . Mental illness Mother   . Heart disease Father   . Diabetes Sister   . Diabetes Sister   . Diabetes Sister    History   Social History  . Marital Status: Single    Spouse Name: N/A    Number of Children: 3  . Years  of Education: GED   Occupational History  . retired     previously worked as a Hospital doctor   Social History Main Topics  . Smoking status: Never Smoker   . Smokeless tobacco: Not on file  . Alcohol Use: Yes  . Drug Use: No  . Sexually Active: Not on file   Other Topics Concern  . Not on file   Social History Narrative   Lives alone here in [place].         Review of Systems: Constitutional: Denies fever, chills, diaphoresis, appetite change and fatigue.  HEENT: Denies redness, congestion, sore throat, rhinorrhea,  mouth sores, trouble swallowing, neck pain, neck stiffness   Respiratory: Denies SOB, DOE, cough, chest tightness,  and wheezing.   Cardiovascular: Denies chest pain, palpitations and leg swelling.  Gastrointestinal: Denies nausea, vomiting, abdominal pain, diarrhea, constipation, blood in stool and abdominal distention.   Neurological: Denies dizziness,  Hematological: Denies adenopathy.  Objective:  Physical  Exam: Filed Vitals:   07/16/12 0821  BP: 138/76  Pulse: 59  Temp: 97.9 F (36.6 C)  TempSrc: Oral  Height: 5\' 2"  (1.575 m)  Weight: 267 lb (121.11 kg)  SpO2: 97%   Constitutional: Vital signs reviewed.  Patient is a obese female in no acute distress and cooperative with exam. Alert and oriented x3.  Eyes: PERRL, EOMI, conjunctivae normal, No scleral icterus.  Neck: Supple,  Normal ROM,  Cardiovascular: RRR, S1 normal, S2 normal, no MRG, pulses symmetric and intact bilaterally Pulmonary/Chest: CTAB, no wheezes, rales, or rhonchi Abdominal: Soft. Non-tender, non-distended, bowel sounds are normal,  Hematology: no cervical adenopathy.  Neurological: A&O x3,   Skin:  Right arm forearm: 1 inch x1 inch round ecchymosis. Mildly tender to palpation. Nonfluctuant. No erythema. Neck-left side in the area of the neck fold: papules present. No erythema. Nofluctuant. Nontender.

## 2012-07-16 NOTE — Patient Instructions (Addendum)
You are experiencing heat pimples. Use in the neck area cool compresses on a regular basis. Tr try not to scratch it but instead tap if it is itchy.  Bruise: if you're noticing that you bruising becomes bigger, or painful, red please call the clinic for further evaluation and management.

## 2012-07-16 NOTE — Assessment & Plan Note (Addendum)
Rash on the neck most likely Miliaria. No concern for infection. Recommended cold compresses and cool showers as needed. Prescription for calamine lotion was provided.instructed patient if she is experiencing redness, swelling, fevers or chills she needs to be evaluated in the clinic.

## 2012-07-17 NOTE — Progress Notes (Signed)
Case discussed with Dr.Illath soon after the resident saw the patient.  We reviewed the resident's history and exam and pertinent patient test results.  I agree with the assessment, diagnosis and plan of care documented in the resident's note. 

## 2012-07-28 ENCOUNTER — Other Ambulatory Visit: Payer: Self-pay | Admitting: *Deleted

## 2012-07-28 MED ORDER — RIVAROXABAN 20 MG PO TABS: 20.0000 mg | ORAL_TABLET | Freq: Every day | ORAL | Status: DC

## 2012-08-04 DIAGNOSIS — I10 Essential (primary) hypertension: Secondary | ICD-10-CM | POA: Diagnosis not present

## 2012-08-04 DIAGNOSIS — I2510 Atherosclerotic heart disease of native coronary artery without angina pectoris: Secondary | ICD-10-CM | POA: Diagnosis not present

## 2012-08-04 DIAGNOSIS — E7800 Pure hypercholesterolemia, unspecified: Secondary | ICD-10-CM | POA: Diagnosis not present

## 2012-08-04 DIAGNOSIS — N189 Chronic kidney disease, unspecified: Secondary | ICD-10-CM | POA: Diagnosis not present

## 2012-08-08 ENCOUNTER — Other Ambulatory Visit: Payer: Self-pay

## 2012-08-08 DIAGNOSIS — N2581 Secondary hyperparathyroidism of renal origin: Secondary | ICD-10-CM | POA: Diagnosis not present

## 2012-08-08 DIAGNOSIS — D649 Anemia, unspecified: Secondary | ICD-10-CM | POA: Diagnosis not present

## 2012-08-14 ENCOUNTER — Other Ambulatory Visit: Payer: Self-pay | Admitting: *Deleted

## 2012-08-14 DIAGNOSIS — M1990 Unspecified osteoarthritis, unspecified site: Secondary | ICD-10-CM

## 2012-08-14 NOTE — Telephone Encounter (Signed)
Last refill 5/21 Pt wants to get Friday Pt # [phone]

## 2012-08-15 MED ORDER — OXYCODONE-ACETAMINOPHEN 5-325 MG PO TABS: 1.0000 | ORAL_TABLET | Freq: Two times a day (BID) | ORAL | Status: DC | PRN

## 2012-08-20 DIAGNOSIS — I129 Hypertensive chronic kidney disease with stage 1 through stage 4 chronic kidney disease, or unspecified chronic kidney disease: Secondary | ICD-10-CM | POA: Diagnosis not present

## 2012-08-20 DIAGNOSIS — E119 Type 2 diabetes mellitus without complications: Secondary | ICD-10-CM | POA: Diagnosis not present

## 2012-08-20 DIAGNOSIS — N2581 Secondary hyperparathyroidism of renal origin: Secondary | ICD-10-CM | POA: Diagnosis not present

## 2012-08-21 ENCOUNTER — Other Ambulatory Visit (HOSPITAL_COMMUNITY): Payer: Self-pay | Admitting: Internal Medicine

## 2012-09-15 ENCOUNTER — Ambulatory Visit (INDEPENDENT_AMBULATORY_CARE_PROVIDER_SITE_OTHER): Payer: Medicare Other | Admitting: Internal Medicine

## 2012-09-15 ENCOUNTER — Encounter: Payer: Self-pay | Admitting: Internal Medicine

## 2012-09-15 VITALS — BP 127/79 | HR 62 | Temp 97.2°F | Wt 268.7 lb

## 2012-09-15 DIAGNOSIS — E785 Hyperlipidemia, unspecified: Secondary | ICD-10-CM

## 2012-09-15 DIAGNOSIS — I2510 Atherosclerotic heart disease of native coronary artery without angina pectoris: Secondary | ICD-10-CM

## 2012-09-15 DIAGNOSIS — M1990 Unspecified osteoarthritis, unspecified site: Secondary | ICD-10-CM | POA: Diagnosis not present

## 2012-09-15 DIAGNOSIS — I82409 Acute embolism and thrombosis of unspecified deep veins of unspecified lower extremity: Secondary | ICD-10-CM | POA: Diagnosis not present

## 2012-09-15 DIAGNOSIS — G4733 Obstructive sleep apnea (adult) (pediatric): Secondary | ICD-10-CM | POA: Diagnosis not present

## 2012-09-15 DIAGNOSIS — I10 Essential (primary) hypertension: Secondary | ICD-10-CM | POA: Diagnosis not present

## 2012-09-15 DIAGNOSIS — N183 Chronic kidney disease, stage 3 unspecified: Secondary | ICD-10-CM | POA: Diagnosis not present

## 2012-09-15 DIAGNOSIS — I82403 Acute embolism and thrombosis of unspecified deep veins of lower extremity, bilateral: Secondary | ICD-10-CM

## 2012-09-15 DIAGNOSIS — Z0000 Encounter for general adult medical examination without abnormal findings: Secondary | ICD-10-CM

## 2012-09-15 LAB — LIPID PANEL
Cholesterol: 161 mg/dL (ref 0–200)
HDL: 41 mg/dL (ref >39)
LDL Cholesterol: 103 mg/dL — ABNORMAL HIGH (ref 0–99)
Triglycerides: 84 mg/dL (ref <150)

## 2012-09-15 LAB — BASIC METABOLIC PANEL WITH GFR
BUN: 24 mg/dL — ABNORMAL HIGH (ref 6–23)
CO2: 24 mEq/L (ref 19–32)
Calcium: 9.2 mg/dL (ref 8.4–10.5)
Chloride: 104 mEq/L (ref 96–112)
Creat: 1.6 mg/dL — ABNORMAL HIGH (ref 0.50–1.10)

## 2012-09-15 MED ORDER — METOPROLOL TARTRATE 50 MG PO TABS: 100.0000 mg | ORAL_TABLET | Freq: Two times a day (BID) | ORAL | Status: DC

## 2012-09-15 MED ORDER — TRAZODONE HCL 50 MG PO TABS: 25.0000 mg | ORAL_TABLET | Freq: Every day | ORAL | Status: DC

## 2012-09-15 MED ORDER — OXYCODONE-ACETAMINOPHEN 5-325 MG PO TABS: 1.0000 | ORAL_TABLET | Freq: Two times a day (BID) | ORAL | Status: DC | PRN

## 2012-09-15 NOTE — Patient Instructions (Addendum)
General Instructions: Please schedule a follow up visit within the next 3-4 months.   For your medications:   Please bring all of your pill  Bottles with you to each visit.  This will help make sure that we have an up to date list of all the medications you are taking.  Please also bring any over the counter herbal medications you are taking (not including advil, tylenol, etc.)  Please continue taking losartan, toviaz, buproprion, metoprolol, amlodipine, xarelto, omeprazole and roxicet.   You will be started on Trazodone 25mg  at bedtime for sleep.  Please start taking this medication on an evening before a day where you do not have any responsibilities in case it makes you drowsy.  There is more information below.   For the pain in your leg, this is a varicose vein which is causing the pain.  The main therapy for this would be NSAIDS (such as ibuprofen) however, you cannot take these medications due to your kidney disease.  You will need to try to keep the leg elevated and use hot and cold compresses to the site for now.  There is a risk that the vein has a clot in it, but you are already being treated with Xarelto for clots.  If the pain does not improve or resolve in the next 2-3 weeks, please give the clinic a call.  If you notice increased swelling in that leg, please give the clinic a call.    Thank you!    Treatment Goals:  Goals (1 Years of Data) as of 09/15/12   None      Progress Toward Treatment Goals:  Treatment Goal 09/15/2012  Blood pressure at goal  Prevent falls at goal    Self Care Goals & Plans:  Self Care Goal 09/15/2012  Manage my medications take my medicines as prescribed; bring my medications to every visit  Monitor my health -  Eat healthy foods -  Be physically active -  Prevent falls wear appropriate shoes       Care Management & Community Referrals:  Referral 09/15/2012  Referrals made for care management support none needed    Trazodone  tablets  What is this medicine? TRAZODONE (TRAZ oh done) is used to treat depression or sleep.  This medicine may be used for other purposes; ask your health care provider or pharmacist if you have questions. What should I tell my health care provider before I take this medicine?  They need to know if you have any of these conditions: -attempted suicide or thinking about it -bipolar disorder -bleeding problems -heart disease, or previous heart attack -irregular heart beat -kidney or liver disease -low levels of sodium in the blood -an unusual or allergic reaction to trazodone, other medicines, foods, dyes or preservatives -pregnant or trying to get pregnant -breast-feeding  How should I use this medicine? Take this medicine by mouth with a glass of water. Follow the directions on the prescription label. Take this medicine shortly after a meal or a light snack at bedtime. Take your medicine at regular intervals. Do not take your medicine more often than directed. Do not stop taking except on your doctor's advice.   A special MedGuide will be given to you by the pharmacist with each prescription and refill. Be sure to read this information carefully each time.  Overdosage: If you think you have taken too much of this medicine contact a poison control center or emergency room at once. NOTE: This medicine is only  for you. Do not share this medicine with others.  What if I miss a dose? If you miss a dose, take it as soon as you can. If it is almost time for your next dose, take only that dose. Do not take double or extra doses. What may interact with this medicine?  Do not take this medicine with any of the following medications: -linezolid -MAOIs like Carbex, Eldepryl, Marplan, Nardil, and Parnate -methylene blue -nefazodone  This medicine may also interact with the following medications: -barbiturates such as phenobarbital -certain antidepressants or  tranquilizers -digoxin -herbal medicines that contain kava kava, St John's wort, or valerian -ketoconazole -medicines for HIV or AIDS -medicines for seizures -other medicines for depression -warfarin  This list may not describe all possible interactions. Give your health care provider a list of all the medicines, herbs, non-prescription drugs, or dietary supplements you use. Also tell them if you smoke, drink alcohol, or use illegal drugs. Some items may interact with your medicine.  What should I watch for while using this medicine? Visit your doctor or health care professional for regular checks on your progress. You may have to take this medicine for two weeks or more before you feel better. Patients and their families should watch out for worsening depression or thoughts of suicide. Also watch out for sudden or severe changes in feelings such as feeling anxious, agitated, panicky, irritable, hostile, aggressive, impulsive, severely restless, overly excited and hyperactive, or not being able to sleep. If this happens, especially at the beginning of antidepressant treatment or after a change in dose, call your health care professional.  You may get drowsy, dizzy or have blurred vision. Do not drive, use machinery, or do anything that needs mental alertness until you know how this medicine affects you. Do not stand or sit up quickly, especially if you are an older patient. This reduces the risk of dizzy or fainting spells. Alcohol may increase dizziness or drowsiness. Avoid alcoholic drinks.  This medicine can make your mouth dry. Chewing sugarless gum, sucking hard candy and drinking plenty of water may help. Contact your doctor if the problem does not go away or is severe.  Do not treat yourself for coughs, colds, or allergies without asking your doctor or health care professional for advice. Some ingredients may increase possible side effects.  If you are going to have surgery, tell your  doctor or health care professional that you are taking this drug.  What side effects may I notice from receiving this medicine? Side effects that you should report to your doctor or health care professional as soon as possible: -allergic reactions like skin rash, itching or hives, swelling of the face, lips, or tongue -fast, irregular heartbeat -feeling faint or lightheaded, falls -painful erections or other sexual dysfunction -suicidal thoughts or other mood changes -trembling Side effects that usually do not require medical attention (report to your doctor or health care professional if they continue or are bothersome): -constipation -headache -muscle aches or pains -nausea, vomiting -unusually weak or tired This list may not describe all possible side effects. Call your doctor for medical advice about side effects. You may report side effects to FDA at 1-800-FDA-1088. Where should I keep my medicine? Keep out of the reach of children. Store at room temperature between 15 and 30 degrees C (59 to 86 degrees F). Protect from light. Keep container tightly closed. Throw away any unused medicine after the expiration date. NOTE: This sheet is a summary. It may not  cover all possible information. If you have questions about this medicine, talk to your doctor, pharmacist, or health care provider.  2012, [employer]. (09/22/2009 5:57:18 PM)

## 2012-09-15 NOTE — Assessment & Plan Note (Signed)
Has previously been normal off of medications (56).  Checking again today.

## 2012-09-15 NOTE — Assessment & Plan Note (Signed)
She is on xarelto with good results and reports taking this medication.  She has a new complaint of thrombophlebitis today on the same side.  She does not have any increased swelling, redness, pain with walking.  I advised her to try warm compresses and to keep the leg elevated.  She will need to remain on xarelto for now.  Reassess need for continued xarelto at next visit. She cannot take nsaids due to CKD.

## 2012-09-15 NOTE — Assessment & Plan Note (Signed)
>>  ASSESSMENT AND PLAN FOR CORONARY ATHEROSCLEROSIS WRITTEN ON 09/15/2012  3:54 PM BY Particia Strahm B, MD  Currently without symptoms.  Back pain atypical and not concerning for ACS today.   Continue losartan , metoprolol  (refilled) and NTG.  She is not able to tolerate statin prior, will check lipid panel today.  Has been well controlled.

## 2012-09-15 NOTE — Progress Notes (Signed)
Subjective:    Patient ID: Vanessa Palmer, female    DOB: 1950/10/24, 62 y.o.   MRN: [id number]  CC : follow up of chronic issues, pain in leg.   HPI  Vanessa Palmer is a 62yo woman with PMH of CAD, HTN, HLD, CKD, recent DVT, GERD and insomnia who presents today for follow up.  The main issues we discussed today are below - -   Insomnia - She has a history of sleep apnea.  On her last move, she lost the cord to plug in her CPAP machine and has been without it since then.  She reports at the time that she could not afford to replace the cord, but thinks she could now.  She is also interested in a medication for relief.  Her insurance would not approve ambien CR.   Migraines - She has a history of migraines, and seems to be having a few more lately, but they are controlled with pain medications.   Pain in leg - She has some pain on her right lateral thigh and a "bulging" feeling.  This is the same leg where she had the clot in it.  She notes that it has been going on for a short while, but cannot be more specific than that.  At the area she has a varicose vein.  She denies any warmth or wound to the area.  She reports compliance with xarelto.   Her last issue today is Back pain at left scapula which goes around to the side.  This is reproducible and worse when she moves.  Not associated with chest pain, SOB, palpitations or pain with breathing.    Otherwise doing well.  She has CAD but denies any chest pain, SOB, DOE, orthopnea or palpitations today.  She further has a documented history of HLD, but last checks of LDL have been in the normal range.  She has well controlled GERD on omeprazole.  She further has gout, but has not had an attack in years.   Meds are reviewed and updated in epic.    Current Outpatient Prescriptions on File Prior to Visit  Medication Sig Dispense Refill  . amLODipine (NORVASC) 10 MG tablet TAKE 1 TABLET (10 MG TOTAL) BY MOUTH DAILY.  30 tablet  6  . buPROPion  (WELLBUTRIN XL) 150 MG 24 hr tablet Take 1 tablet (150 mg total) by mouth every morning.  90 tablet  2  . calamine lotion Apply topically as needed.  120 mL  0  . fluticasone (FLONASE) 50 MCG/ACT nasal spray INSTILL TWO   SPRAYS IN EACH NOSTRIL ONCE DAILY  16 g  2  . losartan (COZAAR) 100 MG tablet Take 1 tablet (100 mg total) by mouth 2 (two) times daily.  60 tablet  3  . omeprazole (PRILOSEC) 40 MG capsule Take 40 mg by mouth 2 (two) times daily.      . Rivaroxaban (XARELTO) 20 MG TABS Take 1 tablet (20 mg total) by mouth daily with supper.  90 tablet  1  . TOVIAZ 8 MG TB24 Take 8 mg by mouth Daily.      . nitroGLYCERIN (NITROSTAT) 0.3 MG SL tablet Place 0.3 mg under the tongue every 5 (five) minutes as needed. For chest pain      . Rivaroxaban (XARELTO) 15 MG TABS tablet Take 1 tablet (15 mg total) by mouth 2 (two) times daily with a meal.  30 tablet  40    Review of Systems  Constitutional:  Positive for fatigue (chronic). Negative for fever and chills.  HENT: Negative for hearing loss, ear pain, sore throat and trouble swallowing.   Eyes: Negative for pain and visual disturbance.  Respiratory: Positive for shortness of breath (with walking for a distance). Negative for cough and wheezing.   Cardiovascular: Negative for chest pain, palpitations and leg swelling.  Gastrointestinal: Positive for constipation (alternating with diarrhea). Negative for abdominal pain, blood in stool and anal bleeding.  Genitourinary: Negative for dysuria and difficulty urinating.  Musculoskeletal: Positive for back pain and gait problem (weakness, uses walker).  Skin: Negative for pallor and rash.  Neurological: Positive for dizziness (occasional, chronic, prior to eating) and headaches (chronic migraines). Negative for syncope and weakness.  Psychiatric/Behavioral: Negative for confusion and decreased concentration.       Objective:   Physical Exam  Constitutional: She is oriented to person, place, and  time. She appears well-developed and well-nourished. No distress.  Glasses, with walker  HENT:  Head: Normocephalic and atraumatic.  Mouth/Throat: No oropharyngeal exudate.  Eyes: Conjunctivae are normal. No scleral icterus.  Cardiovascular: Normal rate, regular rhythm and normal heart sounds.   Pulmonary/Chest: Effort normal and breath sounds normal. No respiratory distress. She has no wheezes. She exhibits no tenderness.  Abdominal: Soft. Bowel sounds are normal. She exhibits no distension.  Musculoskeletal: Normal range of motion. She exhibits tenderness (see below).  + varicosity, tender to palpation, originating on right lateral calf and coursing around to the posterior calf, not hardened.  No noticeable edema to either leg, right leg not larger than left on visual inspection  Neurological: She is alert and oriented to person, place, and time.  Skin: Skin is warm and dry. No rash noted. No erythema.  Psychiatric: She has a normal mood and affect. Her behavior is normal.   Lipid panel and BMET today.      Assessment & Plan:  RTC in 3-4 months, sooner PRN

## 2012-09-15 NOTE — Assessment & Plan Note (Signed)
Currently without symptoms.  Back pain atypical and not concerning for ACS today.   Continue losartan, metoprolol (refilled) and NTG.  She is not able to tolerate statin prior, will check lipid panel today.  Has been well controlled.

## 2012-09-15 NOTE — Assessment & Plan Note (Signed)
Stable, no change in urinary symptoms.   Check BMET today.   Avoid nephrotoxic agents.

## 2012-09-15 NOTE — Assessment & Plan Note (Signed)
BP Readings from Last 3 Encounters:  09/15/12 127/79  07/16/12 138/76  06/16/12 127/83    Lab Results  Component Value Date   NA 134* 04/07/2012   K 4.6 04/07/2012   CREATININE 1.53* 04/07/2012    Assessment: Blood pressure control: controlled Progress toward BP goal:  at goal Comments: check BMET today, she has a history of CKD  Plan: Medications:  continue current medications Educational resources provided: brochure Self management tools provided:   Other plans: bmet today.

## 2012-09-15 NOTE — Assessment & Plan Note (Signed)
She is having insomnia, I believe in large part to her untreated OSA.  I encouraged her to call her medical supplies company today to rectify the situation.   In the interim, will prescribe low dose trazodone and see if this helps.  She does appear chronically fatigued.

## 2012-09-15 NOTE — Assessment & Plan Note (Signed)
MMG: 03/21/12 - negative PAP: s/p hysterectomy - 11/24/2008 - Pap negative, also noted 2005 negative Flu - refused  Colonoscopy - reports having it done "on [employer]", will need to get records.  Tetanus - 09/19/10  Dexa - at 62yo Pneumonia - at 70

## 2012-09-25 ENCOUNTER — Other Ambulatory Visit: Payer: Self-pay | Admitting: Internal Medicine

## 2012-09-25 NOTE — Telephone Encounter (Signed)
Pls sh routine F/U appt Dr Criselda Peaches 3-4 months

## 2012-10-13 ENCOUNTER — Other Ambulatory Visit: Payer: Self-pay | Admitting: *Deleted

## 2012-10-13 ENCOUNTER — Other Ambulatory Visit: Payer: Self-pay | Admitting: Internal Medicine

## 2012-10-13 DIAGNOSIS — M1990 Unspecified osteoarthritis, unspecified site: Secondary | ICD-10-CM

## 2012-10-13 DIAGNOSIS — F333 Major depressive disorder, recurrent, severe with psychotic symptoms: Secondary | ICD-10-CM | POA: Diagnosis not present

## 2012-10-14 ENCOUNTER — Other Ambulatory Visit: Payer: Self-pay | Admitting: *Deleted

## 2012-10-14 MED ORDER — OXYCODONE-ACETAMINOPHEN 5-325 MG PO TABS: 1.0000 | ORAL_TABLET | Freq: Two times a day (BID) | ORAL | Status: DC | PRN

## 2012-10-14 NOTE — Telephone Encounter (Signed)
error 

## 2012-10-15 ENCOUNTER — Encounter: Payer: Self-pay | Admitting: Internal Medicine

## 2012-10-29 ENCOUNTER — Other Ambulatory Visit: Payer: Self-pay | Admitting: *Deleted

## 2012-10-29 MED ORDER — BUPROPION HCL ER (XL) 150 MG PO TB24: 150.0000 mg | ORAL_TABLET | ORAL | Status: DC

## 2012-11-03 DIAGNOSIS — K219 Gastro-esophageal reflux disease without esophagitis: Secondary | ICD-10-CM | POA: Diagnosis not present

## 2012-11-03 DIAGNOSIS — E7800 Pure hypercholesterolemia, unspecified: Secondary | ICD-10-CM | POA: Diagnosis not present

## 2012-11-03 DIAGNOSIS — N189 Chronic kidney disease, unspecified: Secondary | ICD-10-CM | POA: Diagnosis not present

## 2012-11-03 DIAGNOSIS — I10 Essential (primary) hypertension: Secondary | ICD-10-CM | POA: Diagnosis not present

## 2012-11-13 ENCOUNTER — Other Ambulatory Visit: Payer: Self-pay | Admitting: *Deleted

## 2012-11-13 DIAGNOSIS — M1990 Unspecified osteoarthritis, unspecified site: Secondary | ICD-10-CM

## 2012-11-13 MED ORDER — OXYCODONE-ACETAMINOPHEN 5-325 MG PO TABS: 1.0000 | ORAL_TABLET | Freq: Two times a day (BID) | ORAL | Status: DC | PRN

## 2012-11-13 NOTE — Telephone Encounter (Signed)
Call pt when ready - [phone]

## 2012-11-13 NOTE — Telephone Encounter (Signed)
Rx ready for pick up ; pt called. 

## 2012-11-14 ENCOUNTER — Other Ambulatory Visit: Payer: Self-pay | Admitting: *Deleted

## 2012-11-14 DIAGNOSIS — I10 Essential (primary) hypertension: Secondary | ICD-10-CM

## 2012-11-16 ENCOUNTER — Encounter (HOSPITAL_COMMUNITY): Payer: Self-pay | Admitting: *Deleted

## 2012-11-16 ENCOUNTER — Emergency Department (HOSPITAL_COMMUNITY)
Admission: EM | Admit: 2012-11-16 | Discharge: 2012-11-16 | Disposition: A | Discharge status: Home / Self Care | Payer: Medicare Other | Attending: Emergency Medicine | Admitting: Emergency Medicine

## 2012-11-16 DIAGNOSIS — Z8739 Personal history of other diseases of the musculoskeletal system and connective tissue: Secondary | ICD-10-CM | POA: Diagnosis not present

## 2012-11-16 DIAGNOSIS — IMO0002 Reserved for concepts with insufficient information to code with codable children: Secondary | ICD-10-CM | POA: Diagnosis not present

## 2012-11-16 DIAGNOSIS — Z8639 Personal history of other endocrine, nutritional and metabolic disease: Secondary | ICD-10-CM | POA: Insufficient documentation

## 2012-11-16 DIAGNOSIS — F329 Major depressive disorder, single episode, unspecified: Secondary | ICD-10-CM | POA: Insufficient documentation

## 2012-11-16 DIAGNOSIS — Z86718 Personal history of other venous thrombosis and embolism: Secondary | ICD-10-CM | POA: Diagnosis not present

## 2012-11-16 DIAGNOSIS — Z79899 Other long term (current) drug therapy: Secondary | ICD-10-CM | POA: Insufficient documentation

## 2012-11-16 DIAGNOSIS — I2510 Atherosclerotic heart disease of native coronary artery without angina pectoris: Secondary | ICD-10-CM | POA: Diagnosis not present

## 2012-11-16 DIAGNOSIS — N183 Chronic kidney disease, stage 3 unspecified: Secondary | ICD-10-CM | POA: Diagnosis not present

## 2012-11-16 DIAGNOSIS — Z8669 Personal history of other diseases of the nervous system and sense organs: Secondary | ICD-10-CM | POA: Diagnosis not present

## 2012-11-16 DIAGNOSIS — I509 Heart failure, unspecified: Secondary | ICD-10-CM | POA: Diagnosis not present

## 2012-11-16 DIAGNOSIS — B029 Zoster without complications: Secondary | ICD-10-CM | POA: Insufficient documentation

## 2012-11-16 DIAGNOSIS — I129 Hypertensive chronic kidney disease with stage 1 through stage 4 chronic kidney disease, or unspecified chronic kidney disease: Secondary | ICD-10-CM | POA: Insufficient documentation

## 2012-11-16 DIAGNOSIS — F3289 Other specified depressive episodes: Secondary | ICD-10-CM | POA: Insufficient documentation

## 2012-11-16 DIAGNOSIS — E669 Obesity, unspecified: Secondary | ICD-10-CM | POA: Diagnosis not present

## 2012-11-16 DIAGNOSIS — J45909 Unspecified asthma, uncomplicated: Secondary | ICD-10-CM | POA: Insufficient documentation

## 2012-11-16 DIAGNOSIS — Z862 Personal history of diseases of the blood and blood-forming organs and certain disorders involving the immune mechanism: Secondary | ICD-10-CM | POA: Insufficient documentation

## 2012-11-16 MED ORDER — OXYCODONE-ACETAMINOPHEN 5-325 MG PO TABS
1.0000 | ORAL_TABLET | Freq: Once | ORAL | Status: AC
  Administered 2012-11-16: 1 via ORAL
  Filled 2012-11-16: qty 1

## 2012-11-16 MED ORDER — DIPHENHYDRAMINE HCL 25 MG PO CAPS
50.0000 mg | ORAL_CAPSULE | Freq: Once | ORAL | Status: AC
  Administered 2012-11-16: 50 mg via ORAL
  Filled 2012-11-16: qty 2

## 2012-11-16 MED ORDER — ACYCLOVIR 200 MG PO CAPS: 800.0000 mg | ORAL_CAPSULE | Freq: Every day | ORAL | Status: DC

## 2012-11-16 MED ORDER — OXYCODONE-ACETAMINOPHEN 5-325 MG PO TABS: 1.0000 | ORAL_TABLET | ORAL | Status: DC | PRN

## 2012-11-16 MED ORDER — ACYCLOVIR 200 MG PO CAPS
800.0000 mg | ORAL_CAPSULE | Freq: Once | ORAL | Status: AC
  Administered 2012-11-16: 800 mg via ORAL
  Filled 2012-11-16: qty 4

## 2012-11-16 NOTE — ED Provider Notes (Signed)
Medical screening examination/treatment/procedure(s) were performed by non-physician practitioner and as supervising physician I was immediately available for consultation/collaboration.    Gilda Crease, MD 11/16/12 [phone]

## 2012-11-16 NOTE — ED Notes (Signed)
The pt has a blistered rash in 32 areas lt arm for one week

## 2012-11-16 NOTE — ED Provider Notes (Signed)
CSN: [id number]     Arrival date & time 11/16/12  1503 History  This chart was scribed for non-physician practitioner Wylene Simmer, PA-C, working with Gilda Crease, MD, by Dorothey Baseman, ED Scribe. This patient was seen in room TR08C/TR08C and the patient's care was started at 3:32 PM.    Chief Complaint  Patient presents with  . Rash   Patient is a 62 y.o. female presenting with rash. The history is provided by the patient and the spouse. No language interpreter was used.  Rash Location:  Shoulder/arm Shoulder/arm rash location:  L shoulder and L forearm Quality: blistering and burning   Onset quality:  Sudden Progression:  Spreading Chronicity:  New Context: not new detergent/soap    HPI Comments: Vanessa Palmer is a 62 y.o. female who presents to the Emergency Department complaining of a burning rash to the left shoulder, left forearm, and left distal forearm with some associated blistering and that is painful to touch, onset 1 week ago. She denies any recent changes in detergents or soaps. Patient reports a history of chicken pox. Patient denies history of Shingles.   Past Medical History  Diagnosis Date  . Hyperlipidemia   . Hypertension   . Gout   . Obesity   . Depression   . DJD (degenerative joint disease)   . OSA (obstructive sleep apnea)     previously used CPAP, has lost  . CAD (coronary artery disease)     s/p non-Q wave MI in 06/2001 and 2004, 2005  . ALCOHOL ABUSE 12/13/2005    Annotation: Sober since 11/06 Qualifier: Diagnosis of  By: Wallace Cullens MD, Natalia Leatherwood    . INTRINSIC ASTHMA, WITH EXACERBATION 09/27/2009    Qualifier: Diagnosis of  By: Denton Meek MD, Tillie Rung    . Chronic kidney disease (CKD), stage III (moderate) 09/19/2010  . CHF (congestive heart failure)   . DVT of lower extremity, bilateral 04/04/2012    On Xarelto     Past Surgical History  Procedure Laterality Date  . Partial hysterectomy      due to endometriosis  . Tubal ligation    . Cardiac  catheterization      06/2001, 02/2002, 10/2004 (10-15% proximal stenosis of circumflex, diffuse disease of  OM1 and RCA)   Family History  Problem Relation Age of Onset  . Mental illness Mother   . Heart disease Father   . Diabetes Sister   . Diabetes Sister   . Diabetes Sister    History  Substance Use Topics  . Smoking status: Never Smoker   . Smokeless tobacco: Not on file  . Alcohol Use: No     Comment: No alcohol in 30+ days.   OB History   Grav Para Term Preterm Abortions TAB SAB Ect Mult Living                 Review of Systems  Skin: Positive for rash.  All other systems reviewed and are negative.    Allergies  Percocet; Ace inhibitors; Hydromorphone hcl; Propoxyphene-acetaminophen; and Vicodin  Home Medications   Current Outpatient Rx  Name  Route  Sig  Dispense  Refill  . amLODipine (NORVASC) 10 MG tablet      TAKE 1 TABLET (10 MG TOTAL) BY MOUTH DAILY.   30 tablet   6   . buPROPion (WELLBUTRIN XL) 150 MG 24 hr tablet   Oral   Take 1 tablet (150 mg total) by mouth every morning.   90 tablet  3   . calamine lotion   Topical   Apply topically as needed.   120 mL   0   . fluticasone (FLONASE) 50 MCG/ACT nasal spray      INSTILL TWO   SPRAYS IN EACH NOSTRIL ONCE DAILY   16 g   5   . losartan (COZAAR) 100 MG tablet   Oral   Take 1 tablet (100 mg total) by mouth 2 (two) times daily.   60 tablet   3   . metoprolol (LOPRESSOR) 50 MG tablet   Oral   Take 2 tablets (100 mg total) by mouth 2 (two) times daily.   120 tablet   3   . nitroGLYCERIN (NITROSTAT) 0.3 MG SL tablet   Sublingual   Place 0.3 mg under the tongue every 5 (five) minutes as needed. For chest pain         . omeprazole (PRILOSEC) 40 MG capsule   Oral   Take 40 mg by mouth 2 (two) times daily.         Marland Kitchen oxyCODONE-acetaminophen (ROXICET) 5-325 MG per tablet   Oral   Take 1-2 tablets by mouth every 12 (twelve) hours as needed for pain (for chronic joint pain).   120  tablet   0   . EXPIRED: Rivaroxaban (XARELTO) 15 MG TABS tablet   Oral   Take 1 tablet (15 mg total) by mouth 2 (two) times daily with a meal.   30 tablet   40   . Rivaroxaban (XARELTO) 20 MG TABS   Oral   Take 1 tablet (20 mg total) by mouth daily with supper.   90 tablet   1   . TOVIAZ 8 MG TB24   Oral   Take 8 mg by mouth Daily.         . traZODone (DESYREL) 50 MG tablet      TAKE 1/2 TABLET (25 MG TOTAL) BY MOUTH AT BEDTIME.   15 tablet   1    Triage Vitals: BP 98/72  Pulse 64  Temp(Src) 97.7 F (36.5 C)  Resp 20  SpO2 95%  Physical Exam  Nursing note and vitals reviewed. Constitutional: She is oriented to person, place, and time. She appears well-developed and well-nourished. No distress.  HENT:  Head: Normocephalic and atraumatic.  Eyes: Conjunctivae are normal.  Neck: Normal range of motion. Neck supple.  Musculoskeletal: Normal range of motion.  Neurological: She is alert and oriented to person, place, and time.  Skin: Skin is warm and dry. Rash noted.  Vesicular rash to the left shoulder around the deltoid muscle, left forearm, and left distal forearm along a dermatone.   Psychiatric: She has a normal mood and affect. Her behavior is normal.    ED Course  Procedures (including critical care time)  DIAGNOSTIC STUDIES: Oxygen Saturation is 95% on room air, adequate by my interpretation.    COORDINATION OF CARE: 3:34PM- Advised patient that symptoms appear to be due to shingles. Will discharge patient with Acyclovir and pain medication to manage symptoms. Advised patient to follow up with her PCP to receive refills of pain medications and if there are any new or worsening symptoms. Discussed treatment plan with patient at bedside and patient verbalized agreement.     Labs Review Labs Reviewed - No data to display Imaging Review No results found.  MDM  Herpes Zoster  Patient here with new onset zoster infection for the past week - will place on  acyclovir and pain control -  she will follow up with PCP this coming week.   I personally performed the services described in this documentation, which was scribed in my presence. The recorded information has been reviewed and is accurate.    Izola Price Marisue Humble, PA-C 11/16/12 1600

## 2012-11-17 ENCOUNTER — Emergency Department (HOSPITAL_COMMUNITY)
Admission: EM | Admit: 2012-11-17 | Discharge: 2012-11-17 | Discharge status: Against Medical Advice | Payer: Medicare Other | Attending: Emergency Medicine | Admitting: Emergency Medicine

## 2012-11-17 ENCOUNTER — Encounter (HOSPITAL_COMMUNITY): Payer: Self-pay | Admitting: *Deleted

## 2012-11-17 DIAGNOSIS — I129 Hypertensive chronic kidney disease with stage 1 through stage 4 chronic kidney disease, or unspecified chronic kidney disease: Secondary | ICD-10-CM | POA: Diagnosis not present

## 2012-11-17 DIAGNOSIS — I509 Heart failure, unspecified: Secondary | ICD-10-CM | POA: Diagnosis not present

## 2012-11-17 DIAGNOSIS — E669 Obesity, unspecified: Secondary | ICD-10-CM | POA: Insufficient documentation

## 2012-11-17 DIAGNOSIS — I2510 Atherosclerotic heart disease of native coronary artery without angina pectoris: Secondary | ICD-10-CM | POA: Insufficient documentation

## 2012-11-17 DIAGNOSIS — J45901 Unspecified asthma with (acute) exacerbation: Secondary | ICD-10-CM | POA: Insufficient documentation

## 2012-11-17 DIAGNOSIS — N183 Chronic kidney disease, stage 3 unspecified: Secondary | ICD-10-CM | POA: Diagnosis not present

## 2012-11-17 DIAGNOSIS — R21 Rash and other nonspecific skin eruption: Secondary | ICD-10-CM | POA: Diagnosis not present

## 2012-11-17 MED ORDER — LOSARTAN POTASSIUM 100 MG PO TABS: 100.0000 mg | ORAL_TABLET | Freq: Two times a day (BID) | ORAL | Status: DC

## 2012-11-17 NOTE — ED Notes (Signed)
Pt presents with three clusters of non draining closed vesicles to her left wrist, left forearm, and left shoulder. Pt states she was seen here yesterday and diagnosed with shingles. 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

## 2012-11-19 ENCOUNTER — Encounter: Payer: Self-pay | Admitting: Internal Medicine

## 2012-11-19 ENCOUNTER — Ambulatory Visit (INDEPENDENT_AMBULATORY_CARE_PROVIDER_SITE_OTHER): Payer: Medicare Other | Admitting: Internal Medicine

## 2012-11-19 VITALS — BP 146/106 | HR 60 | Temp 97.2°F | Ht 61.25 in | Wt 268.4 lb

## 2012-11-19 DIAGNOSIS — B029 Zoster without complications: Secondary | ICD-10-CM | POA: Insufficient documentation

## 2012-11-19 DIAGNOSIS — I10 Essential (primary) hypertension: Secondary | ICD-10-CM | POA: Diagnosis not present

## 2012-11-19 NOTE — Assessment & Plan Note (Signed)
Patient's blood pressure is elevated at 146/106. Her blood pressure has been well controlled in the past. I think it is most likely due to pain. Will not change her current blood pressure medications today. Will followup.

## 2012-11-19 NOTE — Assessment & Plan Note (Signed)
Patient's painful rashes are most likely shingles. She has been treated with acyclovir. She responded to treatment well. Her pain is controlled with Percocet. She does not have fever or chills. Most of her rashes are crusted already. will continue current regimen.

## 2012-11-19 NOTE — Patient Instructions (Signed)
1. Please continue Zovirax (acyclovir) and percocet for pain. 2. Your blood pressure is little high today. It is most likely caused by pain.  I will not change your blood pressure medications today.  3. If you have worsening of your symptoms or new symptoms arise, please call the clinic ([phone]), or go to the ER immediately if symptoms are severe.  You have done great job in taking all your medications. I appreciate it very much. Please continue doing that.  Shingles Shingles (herpes zoster) is an infection that is caused by the same virus that causes chickenpox (varicella). The infection causes a painful skin rash and fluid-filled blisters, which eventually break open, crust over, and heal. It may occur in any area of the body, but it usually affects only one side of the body or face. The pain of shingles usually lasts about 1 month. However, some people with shingles may develop long-term (chronic) pain in the affected area of the body. Shingles often occurs many years after the person had chickenpox. It is more common:  In people older than 50 years.  In people with weakened immune systems, such as those with HIV, AIDS, or cancer.  In people taking medicines that weaken the immune system, such as transplant medicines.  In people under great stress. CAUSES  Shingles is caused by the varicella zoster virus (VZV), which also causes chickenpox. After a person is infected with the virus, it can remain in the person's body for years in an inactive state (dormant). To cause shingles, the virus reactivates and breaks out as an infection in a nerve root. The virus can be spread from person to person (contagious) through contact with open blisters of the shingles rash. It will only spread to people who have not had chickenpox. When these people are exposed to the virus, they may develop chickenpox. They will not develop shingles. Once the blisters scab over, the person is no longer contagious and  cannot spread the virus to others. SYMPTOMS  Shingles shows up in stages. The initial symptoms may be pain, itching, and tingling in an area of the skin. This pain is usually described as burning, stabbing, or throbbing.In a few days or weeks, a painful red rash will appear in the area where the pain, itching, and tingling were felt. The rash is usually on one side of the body in a band or belt-like pattern. Then, the rash usually turns into fluid-filled blisters. They will scab over and dry up in approximately 2 3 weeks. Flu-like symptoms may also occur with the initial symptoms, the rash, or the blisters. These may include:  Fever.  Chills.  Headache.  Upset stomach. DIAGNOSIS  Your caregiver will perform a skin exam to diagnose shingles. Skin scrapings or fluid samples may also be taken from the blisters. This sample will be examined under a microscope or sent to a lab for further testing. TREATMENT  There is no specific cure for shingles. Your caregiver will likely prescribe medicines to help you manage the pain, recover faster, and avoid long-term problems. This may include antiviral drugs, anti-inflammatory drugs, and pain medicines. HOME CARE INSTRUCTIONS   Take a cool bath or apply cool compresses to the area of the rash or blisters as directed. This may help with the pain and itching.   Only take over-the-counter or prescription medicines as directed by your caregiver.   Rest as directed by your caregiver.  Keep your rash and blisters clean with mild soap and cool water or  as directed by your caregiver.  Do not pick your blisters or scratch your rash. Apply an anti-itch cream or numbing creams to the affected area as directed by your caregiver.  Keep your shingles rash covered with a loose bandage (dressing).  Avoid skin contact with:  Babies.   Pregnant women.   Children with eczema.   Elderly people with transplants.   People with chronic illnesses, such as  leukemia or AIDS.   Wear loose-fitting clothing to help ease the pain of material rubbing against the rash.  Keep all follow-up appointments with your caregiver.If the area involved is on your face, you may receive a referral for follow-up to a specialist, such as an eye doctor (ophthalmologist) or an ear, nose, and throat (ENT) doctor. Keeping all follow-up appointments will help you avoid eye complications, chronic pain, or disability.  SEEK IMMEDIATE MEDICAL CARE IF:   You have facial pain, pain around the eye area, or loss of feeling on one side of your face.  You have ear pain or ringing in your ear.  You have loss of taste.  Your pain is not relieved with prescribed medicines.   Your redness or swelling spreads.   You have more pain and swelling.  Your condition is worsening or has changed.   You have a feveror persistent symptoms for more than 2 3 days.  You have a fever and your symptoms suddenly get worse. MAKE SURE YOU:  Understand these instructions.  Will watch your condition.  Will get help right away if you are not doing well or get worse. Document Released: 02/12/2005 Document Revised: 11/07/2011 Document Reviewed: 09/27/2011 [employer] [facility] [employer] [facility] [employer] [facility] Patient Information 2014 [place], [place].

## 2012-11-19 NOTE — Progress Notes (Signed)
Patient ID: Vanessa Palmer, female   DOB: 1950-03-03, 62 y.o.   MRN: [id number] Subjective:   Patient ID: Vanessa Palmer female   DOB: [birth year] 62 y.o.   MRN: [id number]  CC:   ED followup visit. HPI:  [name] is a 62 y.o. lady with past medical history as outlined below, who presents for ED followup visit today.   1. Shingles: Patient visited ED on 9/21 due to rashes. She noticed some rasher over her left shoulder, left forearm, and left distal forearm. They are blistering and very painful. Has been going on for about one week. She denies any recent changes in detergents or soaps. Patient reports a history of chicken pox. Patient denies history of Shingles. Patient was diagnosed as shingles. She was given prescription of acyclovir 800 mg, 5 times a day and Percocet. Today she reports that most of rashes crusted. And her pain improved significantly. She does not have fever or chills  2. Hypertension: patient is taking amlodipine 10 mg daily, Cozaar 100 mg twice a day, metoprolol 100 mg twice a day. Blood pressure is elevated, 146/106. She does not have chest pain, shortness of breath, palpitation leg edema.  ROS:  Denies fever, chills, fatigue, headaches, cough, chest pain, SOB,  abdominal pain, diarrhea, constipation, dysuria, urgency, frequency, hematuria.    Past Medical History  Diagnosis Date  . Hyperlipidemia   . Hypertension   . Gout   . Obesity   . Depression   . DJD (degenerative joint disease)   . OSA (obstructive sleep apnea)     previously used CPAP, has lost  . CAD (coronary artery disease)     s/p non-Q wave MI in 06/2001 and 2004, 2005  . ALCOHOL ABUSE 12/13/2005    Annotation: Sober since 11/06 Qualifier: Diagnosis of  By: Wallace Cullens MD, Natalia Leatherwood    . INTRINSIC ASTHMA, WITH EXACERBATION 09/27/2009    Qualifier: Diagnosis of  By: Denton Meek MD, Tillie Rung    . Chronic kidney disease (CKD), stage III (moderate) 09/19/2010  . CHF (congestive heart failure)   . DVT of  lower extremity, bilateral 04/04/2012    On Xarelto     Current Outpatient Prescriptions  Medication Sig Dispense Refill  . amLODipine (NORVASC) 10 MG tablet Take 10 mg by mouth daily.      Marland Kitchen buPROPion (WELLBUTRIN XL) 150 MG 24 hr tablet Take 1 tablet (150 mg total) by mouth every morning.  90 tablet  3  . fluticasone (FLONASE) 50 MCG/ACT nasal spray Place 2 sprays into the nose daily.      Marland Kitchen losartan (COZAAR) 100 MG tablet Take 1 tablet (100 mg total) by mouth 2 (two) times daily.  60 tablet  3  . metoprolol (LOPRESSOR) 50 MG tablet Take 2 tablets (100 mg total) by mouth 2 (two) times daily.  120 tablet  3  . nitroGLYCERIN (NITROSTAT) 0.4 MG SL tablet Place 0.4 mg under the tongue every 5 (five) minutes as needed for chest pain.      Marland Kitchen omeprazole (PRILOSEC) 40 MG capsule Take 40 mg by mouth 2 (two) times daily.      Marland Kitchen oxyCODONE-acetaminophen (PERCOCET/ROXICET) 5-325 MG per tablet Take 1-2 tablets by mouth every 4 (four) hours as needed for pain.      . Rivaroxaban (XARELTO) 20 MG TABS Take 1 tablet (20 mg total) by mouth daily with supper.  90 tablet  1  . TOVIAZ 8 MG TB24 Take 8 mg by mouth Daily.      Marland Kitchen  traZODone (DESYREL) 50 MG tablet Take 25 mg by mouth at bedtime.      Marland Kitchen acyclovir (ZOVIRAX) 200 MG capsule Take 4 capsules (800 mg total) by mouth 5 (five) times daily.  140 capsule  0   No current facility-administered medications for this visit.   Family History  Problem Relation Age of Onset  . Mental illness Mother   . Heart disease Father   . Diabetes Sister   . Diabetes Sister   . Diabetes Sister    History   Social History  . Marital Status: Single    Spouse Name: N/A    Number of Children: 3  . Years of Education: GED   Occupational History  . retired     previously worked as a Hospital doctor   Social History Main Topics  . Smoking status: Never Smoker   . Smokeless tobacco: None  . Alcohol Use: No     Comment: No alcohol in 30+ days.  . Drug Use: No  . Sexual  Activity: None   Other Topics Concern  . None   Social History Narrative   Lives alone here in [place].          Review of Systems: Full 14-point review of systems otherwise negative. See HPI.   Objective:  Physical Exam: Filed Vitals:   11/19/12 0832  BP: 146/106  Pulse: 60  Temp: 97.2 F (36.2 C)  TempSrc: Oral  Height: 5' 1.25" (1.556 m)  Weight: 268 lb 6.4 oz (121.745 kg)  SpO2: 98%   Physical Exam  Nursing note and vitals reviewed. Constitutional: She is oriented to person, place, and time. She appears well-developed and well-nourished. No distress.  HENT:   Head: Normocephalic and atraumatic.  Eyes: Conjunctivae are normal.  Neck: Normal range of motion. Neck supple.  Musculoskeletal: Normal range of motion.  Neurological: She is alert and oriented to person, place, and time.  Skin: Skin is warm and dry. Vesicular rash to the left shoulder around the deltoid muscle, left forearm, and left distal forearm along a dermatone, most of them have already crusted.  Psychiatric: She has a normal mood and affect. Her behavior is normal.    Assessment & Plan:

## 2012-11-20 NOTE — Progress Notes (Signed)
Case discussed with Dr. Niu at the time of the visit.  We reviewed the resident's history and exam and pertinent patient test results.  I agree with the assessment, diagnosis, and plan of care documented in the resident's note.    

## 2012-11-25 ENCOUNTER — Encounter (HOSPITAL_COMMUNITY): Payer: Self-pay | Admitting: *Deleted

## 2012-11-25 ENCOUNTER — Emergency Department (HOSPITAL_COMMUNITY)
Admission: EM | Admit: 2012-11-25 | Discharge: 2012-11-25 | Disposition: A | Discharge status: Home / Self Care | Payer: Medicare Other | Attending: Emergency Medicine | Admitting: Emergency Medicine

## 2012-11-25 DIAGNOSIS — R21 Rash and other nonspecific skin eruption: Secondary | ICD-10-CM | POA: Diagnosis not present

## 2012-11-25 DIAGNOSIS — Z8639 Personal history of other endocrine, nutritional and metabolic disease: Secondary | ICD-10-CM | POA: Insufficient documentation

## 2012-11-25 DIAGNOSIS — I129 Hypertensive chronic kidney disease with stage 1 through stage 4 chronic kidney disease, or unspecified chronic kidney disease: Secondary | ICD-10-CM | POA: Insufficient documentation

## 2012-11-25 DIAGNOSIS — E669 Obesity, unspecified: Secondary | ICD-10-CM | POA: Insufficient documentation

## 2012-11-25 DIAGNOSIS — F329 Major depressive disorder, single episode, unspecified: Secondary | ICD-10-CM | POA: Diagnosis not present

## 2012-11-25 DIAGNOSIS — Z862 Personal history of diseases of the blood and blood-forming organs and certain disorders involving the immune mechanism: Secondary | ICD-10-CM | POA: Diagnosis not present

## 2012-11-25 DIAGNOSIS — Z86718 Personal history of other venous thrombosis and embolism: Secondary | ICD-10-CM | POA: Insufficient documentation

## 2012-11-25 DIAGNOSIS — Z9861 Coronary angioplasty status: Secondary | ICD-10-CM | POA: Insufficient documentation

## 2012-11-25 DIAGNOSIS — Z8739 Personal history of other diseases of the musculoskeletal system and connective tissue: Secondary | ICD-10-CM | POA: Insufficient documentation

## 2012-11-25 DIAGNOSIS — N183 Chronic kidney disease, stage 3 unspecified: Secondary | ICD-10-CM | POA: Insufficient documentation

## 2012-11-25 DIAGNOSIS — I509 Heart failure, unspecified: Secondary | ICD-10-CM | POA: Diagnosis not present

## 2012-11-25 DIAGNOSIS — E785 Hyperlipidemia, unspecified: Secondary | ICD-10-CM | POA: Diagnosis not present

## 2012-11-25 DIAGNOSIS — IMO0002 Reserved for concepts with insufficient information to code with codable children: Secondary | ICD-10-CM | POA: Insufficient documentation

## 2012-11-25 DIAGNOSIS — F3289 Other specified depressive episodes: Secondary | ICD-10-CM | POA: Insufficient documentation

## 2012-11-25 DIAGNOSIS — I2510 Atherosclerotic heart disease of native coronary artery without angina pectoris: Secondary | ICD-10-CM | POA: Insufficient documentation

## 2012-11-25 DIAGNOSIS — Z8669 Personal history of other diseases of the nervous system and sense organs: Secondary | ICD-10-CM | POA: Insufficient documentation

## 2012-11-25 DIAGNOSIS — Z79899 Other long term (current) drug therapy: Secondary | ICD-10-CM | POA: Diagnosis not present

## 2012-11-25 DIAGNOSIS — Z09 Encounter for follow-up examination after completed treatment for conditions other than malignant neoplasm: Secondary | ICD-10-CM | POA: Diagnosis not present

## 2012-11-25 DIAGNOSIS — J45909 Unspecified asthma, uncomplicated: Secondary | ICD-10-CM | POA: Diagnosis not present

## 2012-11-25 NOTE — ED Notes (Signed)
Pt states feels "better". Has dry,scabbed lesions to left forearm.

## 2012-11-25 NOTE — ED Notes (Signed)
Pt was diagnosed on 9/21 with shingles, wants a note to say she is ok to work. Denies any more blisters or drainage.. No acute distress noted at triage.

## 2012-11-25 NOTE — ED Provider Notes (Signed)
CSN: [id number]     Arrival date & time 11/25/12  1652 History  This chart was scribed for non-physician practitioner Felicie Morn, NP, working with Toy Baker, MD by Ronal Fear, ED scribe. This patient was seen in room TR08C/TR08C and the patient's care was started at 5:41 PM.      Chief Complaint  Patient presents with  . Follow-up   The history is provided by the patient. No language interpreter was used.    HPI Comments: Vanessa Palmer is a 62 y.o. female who presents to the Emergency Department needing a note to return to a program that she is participating in.  She was seen 9x days ago for shingles on her right arm and shoulder and was given medication for the shingles and the drainage stopped 1x week later. The vesicular rash is currently drying.     Past Medical History  Diagnosis Date  . Hyperlipidemia   . Hypertension   . Gout   . Obesity   . Depression   . DJD (degenerative joint disease)   . OSA (obstructive sleep apnea)     previously used CPAP, has lost  . CAD (coronary artery disease)     s/p non-Q wave MI in 06/2001 and 2004, 2005  . ALCOHOL ABUSE 12/13/2005    Annotation: Sober since 11/06 Qualifier: Diagnosis of  By: Wallace Cullens MD, Natalia Leatherwood    . INTRINSIC ASTHMA, WITH EXACERBATION 09/27/2009    Qualifier: Diagnosis of  By: Denton Meek MD, Tillie Rung    . Chronic kidney disease (CKD), stage III (moderate) 09/19/2010  . CHF (congestive heart failure)   . DVT of lower extremity, bilateral 04/04/2012    On Xarelto     Past Surgical History  Procedure Laterality Date  . Partial hysterectomy      due to endometriosis  . Tubal ligation    . Cardiac catheterization      06/2001, 02/2002, 10/2004 (10-15% proximal stenosis of circumflex, diffuse disease of  OM1 and RCA)   Family History  Problem Relation Age of Onset  . Mental illness Mother   . Heart disease Father   . Diabetes Sister   . Diabetes Sister   . Diabetes Sister    History  Substance Use Topics  . Smoking  status: Never Smoker   . Smokeless tobacco: Not on file  . Alcohol Use: No     Comment: No alcohol in 30+ days.   OB History   Grav Para Term Preterm Abortions TAB SAB Ect Mult Living                 Review of Systems  Skin: Positive for rash.  All other systems reviewed and are negative.    Allergies  Percocet; Ace inhibitors; Hydromorphone hcl; Propoxyphene-acetaminophen; and Vicodin  Home Medications   Current Outpatient Rx  Name  Route  Sig  Dispense  Refill  . amLODipine (NORVASC) 10 MG tablet   Oral   Take 10 mg by mouth daily.         Marland Kitchen buPROPion (WELLBUTRIN XL) 150 MG 24 hr tablet   Oral   Take 1 tablet (150 mg total) by mouth every morning.   90 tablet   3   . fluticasone (FLONASE) 50 MCG/ACT nasal spray   Nasal   Place 2 sprays into the nose daily.         Marland Kitchen losartan (COZAAR) 100 MG tablet   Oral   Take 1 tablet (100 mg total) by  mouth 2 (two) times daily.   60 tablet   3   . metoprolol (LOPRESSOR) 50 MG tablet   Oral   Take 2 tablets (100 mg total) by mouth 2 (two) times daily.   120 tablet   3   . nitroGLYCERIN (NITROSTAT) 0.4 MG SL tablet   Sublingual   Place 0.4 mg under the tongue every 5 (five) minutes as needed for chest pain.         Marland Kitchen omeprazole (PRILOSEC) 40 MG capsule   Oral   Take 40 mg by mouth 2 (two) times daily.         Marland Kitchen oxyCODONE-acetaminophen (PERCOCET/ROXICET) 5-325 MG per tablet   Oral   Take 1-2 tablets by mouth every 4 (four) hours as needed for pain.         . Rivaroxaban (XARELTO) 20 MG TABS   Oral   Take 1 tablet (20 mg total) by mouth daily with supper.   90 tablet   1   . TOVIAZ 8 MG TB24   Oral   Take 8 mg by mouth Daily.         . traZODone (DESYREL) 50 MG tablet   Oral   Take 25 mg by mouth at bedtime.         Marland Kitchen acyclovir (ZOVIRAX) 200 MG capsule   Oral   Take 4 capsules (800 mg total) by mouth 5 (five) times daily.   140 capsule   0    BP 113/75  Pulse 62  Temp(Src) 97.7 F  (36.5 C) (Oral)  Wt 268 lb (121.564 kg)  BMI 50.21 kg/m2  SpO2 97% Physical Exam  Nursing note and vitals reviewed. Constitutional: She is oriented to person, place, and time. She appears well-developed and well-nourished. No distress.  HENT:  Head: Normocephalic and atraumatic.  Eyes: EOM are normal.  Neck: Neck supple. No tracheal deviation present.  Cardiovascular: Normal rate.   Pulmonary/Chest: Effort normal. No respiratory distress.  Musculoskeletal: Normal range of motion.  Neurological: She is alert and oriented to person, place, and time.  Skin: Skin is warm and dry.  Drying of a previously draining vesicular rash on her left arm and shoulder  Psychiatric: She has a normal mood and affect. Her behavior is normal.    ED Course  Procedures (including critical care time)  DIAGNOSTIC STUDIES: Oxygen Saturation is 97% on RA, adequate by my interpretation.    COORDINATION OF CARE: 6:07 PM- Pt advised of plan for treatment including following up with her PCP if rash does not seem to get any better and pt agrees.      Labs Review Labs Reviewed - No data to display Imaging Review No results found.  MDM  Improving vesicular rash.  I personally performed the services described in this documentation, which was scribed in my presence. The recorded information has been reviewed and is accurate.    Jimmye Norman, NP 11/26/12 0200

## 2012-11-28 NOTE — ED Provider Notes (Signed)
Medical screening examination/treatment/procedure(s) were performed by non-physician practitioner and as supervising physician I was immediately available for consultation/collaboration.  Anna-Marie Coller T Lasean Rahming, MD 11/28/12 0709 

## 2012-12-08 ENCOUNTER — Ambulatory Visit (INDEPENDENT_AMBULATORY_CARE_PROVIDER_SITE_OTHER): Payer: Medicare Other | Admitting: Internal Medicine

## 2012-12-08 ENCOUNTER — Encounter: Payer: Self-pay | Admitting: Internal Medicine

## 2012-12-08 VITALS — BP 128/86 | HR 63 | Temp 97.5°F | Wt 268.3 lb

## 2012-12-08 DIAGNOSIS — E785 Hyperlipidemia, unspecified: Secondary | ICD-10-CM | POA: Diagnosis not present

## 2012-12-08 DIAGNOSIS — I2510 Atherosclerotic heart disease of native coronary artery without angina pectoris: Secondary | ICD-10-CM

## 2012-12-08 DIAGNOSIS — M1990 Unspecified osteoarthritis, unspecified site: Secondary | ICD-10-CM

## 2012-12-08 DIAGNOSIS — I10 Essential (primary) hypertension: Secondary | ICD-10-CM

## 2012-12-08 DIAGNOSIS — M2550 Pain in unspecified joint: Secondary | ICD-10-CM | POA: Diagnosis not present

## 2012-12-08 DIAGNOSIS — B029 Zoster without complications: Secondary | ICD-10-CM

## 2012-12-08 DIAGNOSIS — N183 Chronic kidney disease, stage 3 unspecified: Secondary | ICD-10-CM

## 2012-12-08 DIAGNOSIS — G4733 Obstructive sleep apnea (adult) (pediatric): Secondary | ICD-10-CM | POA: Diagnosis not present

## 2012-12-08 DIAGNOSIS — Z0000 Encounter for general adult medical examination without abnormal findings: Secondary | ICD-10-CM | POA: Diagnosis not present

## 2012-12-08 LAB — BASIC METABOLIC PANEL WITH GFR
BUN: 22 mg/dL (ref 6–23)
CO2: 26 mEq/L (ref 19–32)
Calcium: 8.8 mg/dL (ref 8.4–10.5)
Creat: 1.8 mg/dL — ABNORMAL HIGH (ref 0.50–1.10)
GFR, Est African American: 35 mL/min — ABNORMAL LOW
Glucose, Bld: 106 mg/dL — ABNORMAL HIGH (ref 70–99)

## 2012-12-08 MED ORDER — OXYCODONE-ACETAMINOPHEN 5-325 MG PO TABS: 1.0000 | ORAL_TABLET | Freq: Two times a day (BID) | ORAL | Status: DC | PRN

## 2012-12-08 MED ORDER — ATORVASTATIN CALCIUM 40 MG PO TABS: 40.0000 mg | ORAL_TABLET | Freq: Every day | ORAL | Status: DC

## 2012-12-08 NOTE — Progress Notes (Signed)
  Subjective:    Patient ID: Vanessa Palmer, female    DOB: 1950/10/08, 62 y.o.   MRN: [id number]  CC: Routine follow up  Knee Pain   Vanessa Palmer is a 62yo woman with PMH of CAD, CKD stage 3, HTN, HLD, OSA who presents today for follow up.   She complains today of knee pain which is chronic and is concerned that the refill of her percocet will fall on the weekend.  Her pain is tolerable with her pain medications and she has been compliant with her pain contract.    She further reports needing assistance getting her motorized WC fixed.  I have advised her to send any paperwork she needs filled out to me.    Otherwise, she is doing well.  She has CAD for which she takes losartan and metoprolol.  We discussed the need to be on statin and she is willing to restart this medication.  She has HLD, last LDL was 103, however, given the ACC guidelines, she should be on a statin due to her CAD alone.  She also has OSA and sleep difficulties, however, she has not gotten her CPAP machine fixed.  I encouraged her to do so.  She has had some improvement in her sleep with trazadone.  She has CKD, no change in urinary symptoms today.  She will have a BMET.   We reviewed her medications, but she cannot remember which ones she takes.  Official review not done.  Will need reminding to bring her pill bottles in.    She has a mild URI right now which is improving.  Main symptom is a cough.  No fever, chills, sputum production, SOB, myalgias.  She recently recovered from the shingles and her pain has improved greatly.    Review of Systems  Constitutional: Negative for fever, chills and fatigue.  HENT: Positive for rhinorrhea, sinus pressure and sneezing. Negative for ear pain.   Eyes: Negative for pain and visual disturbance.  Respiratory: Positive for cough. Negative for choking.   Cardiovascular: Negative for chest pain and leg swelling.  Gastrointestinal: Positive for constipation (chronic from pain  medication, uses miralx). Negative for nausea, vomiting, abdominal pain and diarrhea.  Genitourinary: Negative for dysuria and difficulty urinating.  Musculoskeletal: Positive for arthralgias, back pain and gait problem. Negative for myalgias.  Skin: Positive for rash (recovering shingles rash on left arm). Negative for wound.  Neurological: Negative for dizziness and light-headedness.  Psychiatric/Behavioral: Negative for confusion and decreased concentration.       Objective:   Physical Exam  Vitals reviewed. Constitutional: She is oriented to person, place, and time. She appears well-developed and well-nourished.  Appears older than stated age  HENT:  Head: Normocephalic and atraumatic.  Eyes: Conjunctivae are normal. No scleral icterus.  Neck: Neck supple.  Cardiovascular: Normal rate, regular rhythm and normal heart sounds.   Pulmonary/Chest: Effort normal and breath sounds normal. No respiratory distress. She has no wheezes.  Abdominal: Soft. Bowel sounds are normal.  Musculoskeletal: She exhibits no edema.  Neurological: She is alert and oriented to person, place, and time.  Skin: Skin is warm and dry. Rash (healing shingles rash in patches on left arm) noted.  Psychiatric: She has a normal mood and affect. Her behavior is normal.    BMP today to evaluate renal function      Assessment & Plan:  RTC in 4-5 months, sooner if needed

## 2012-12-08 NOTE — Assessment & Plan Note (Signed)
No current symptoms.  Currently on losartan and metoprolol.  Add statin today.

## 2012-12-08 NOTE — Patient Instructions (Signed)
General Instructions: Please schedule a follow up visit within the next 4-5 months, sooner if needed.   For your medications:   Please bring all of your pill  Bottles with you to each visit.  This will help make sure that we have an up to date list of all the medications you are taking.  Please also bring any over the counter herbal medications you are taking (not including advil, tylenol, etc.)  Please continue taking all of your medications as prescribed.   Please START taking Lipitor 40mg  daily.  I have sent this to your pharmacy.   You will get paper prescriptions for your pain medication today.   Please get your CPAP machine fixed when you can and start using it.  This will help with your daytime sleepiness.    Please call with any questions!  Thank you!    Treatment Goals:  Goals (1 Years of Data) as of 12/08/12   None      Progress Toward Treatment Goals:  Treatment Goal 12/08/2012  Blood pressure at goal  Prevent falls -    Self Care Goals & Plans:  Self Care Goal 12/08/2012  Manage my medications take my medicines as prescribed; bring my medications to every visit; refill my medications on time  Monitor my health keep track of my weight  Eat healthy foods eat foods that are low in salt; eat baked foods instead of fried foods  Be physically active find an activity I enjoy  Prevent falls -    No flowsheet data found.   Care Management & Community Referrals:  Referral 09/15/2012  Referrals made for care management support none needed

## 2012-12-08 NOTE — Assessment & Plan Note (Signed)
She has not gotten her CPAP equipment.  I advised her to continue trying to get that resolved and to start using this machine as it will greatly help with her daytime somnolence.

## 2012-12-08 NOTE — Assessment & Plan Note (Signed)
Have given her a printed refill on her percocet to be filled this Friday.

## 2012-12-08 NOTE — Assessment & Plan Note (Signed)
She has a previous result of LDL 103, however, given her CAD, she should be on a statin. Will start lipitor 40mg  today.

## 2012-12-08 NOTE — Assessment & Plan Note (Addendum)
Cr at last check ws 1.6, which was mildly above baseline.  Recheck today.  No urinary complaints/symptoms.   UPDATE: Cr 1.8 at check today, which is worsening still.  She needs further evaluation for cause of rising Cr, she does see [facility], but last visit note we have is from 01/2012.  Will attempt to get further notes and have her visit with her nephrologist asap for further evaluation.

## 2012-12-08 NOTE — Assessment & Plan Note (Signed)
BP Readings from Last 3 Encounters:  12/08/12 128/86  11/25/12 113/75  11/19/12 146/106    Lab Results  Component Value Date   NA 137 09/15/2012   K 4.6 09/15/2012   CREATININE 1.60* 09/15/2012    Assessment: Blood pressure control: controlled Progress toward BP goal:  at goal   Plan: Medications:  continue current medications Educational resources provided: brochure Self management tools provided: home blood pressure logbook Other plans: check renal function today, somewhat increased at last check.

## 2012-12-08 NOTE — Assessment & Plan Note (Signed)
>>  ASSESSMENT AND PLAN FOR CORONARY ATHEROSCLEROSIS WRITTEN ON 12/08/2012  2:59 PM BY Jayceon Troy B, MD  No current symptoms.  Currently on losartan  and metoprolol .  Add statin today.

## 2012-12-08 NOTE — Assessment & Plan Note (Signed)
Rash improving

## 2012-12-11 ENCOUNTER — Other Ambulatory Visit: Payer: Self-pay | Admitting: Internal Medicine

## 2012-12-30 ENCOUNTER — Encounter: Payer: Self-pay | Admitting: Internal Medicine

## 2012-12-31 ENCOUNTER — Telehealth: Payer: Self-pay | Admitting: *Deleted

## 2012-12-31 NOTE — Telephone Encounter (Signed)
Thank you :)

## 2012-12-31 NOTE — Telephone Encounter (Signed)
Message copied by Hassan Buckler on Wed Dec 31, 2012 11:08 AM ------      Message from: Inez Catalina      Created: Tue Dec 30, 2012  3:35 PM       Christianne Borrow - -             Can you call  [facility] and see if Ms. Hutmacher has been seen after December of 2013?  If not, could you please call Ms. Tino and get her an appointment with them.  She has worsening renal function and should be seen soon.             Thank you!!            EBM ------

## 2012-12-31 NOTE — Telephone Encounter (Signed)
[facility] states pt has been seen this year - notes requested.

## 2013-01-06 ENCOUNTER — Telehealth: Payer: Self-pay | Admitting: *Deleted

## 2013-01-06 NOTE — Telephone Encounter (Signed)
Call from pt - requesting telephone # for her bladder doctor. No # in chart nor referral. Pt states he's in [facility]; name ?Mullins or something similar. When googled, found Dr Tobie Lords at [facility] [phone]).

## 2013-01-07 ENCOUNTER — Other Ambulatory Visit: Payer: Self-pay | Admitting: *Deleted

## 2013-01-07 DIAGNOSIS — I10 Essential (primary) hypertension: Secondary | ICD-10-CM

## 2013-01-07 MED ORDER — METOPROLOL TARTRATE 50 MG PO TABS: 100.0000 mg | ORAL_TABLET | Freq: Two times a day (BID) | ORAL | Status: DC

## 2013-01-09 ENCOUNTER — Other Ambulatory Visit: Payer: Self-pay | Admitting: *Deleted

## 2013-01-09 DIAGNOSIS — M1990 Unspecified osteoarthritis, unspecified site: Secondary | ICD-10-CM

## 2013-01-09 MED ORDER — OXYCODONE-ACETAMINOPHEN 5-325 MG PO TABS: 1.0000 | ORAL_TABLET | Freq: Two times a day (BID) | ORAL | Status: DC | PRN

## 2013-01-09 NOTE — Telephone Encounter (Signed)
Would you like to do 3 scripts, nov, dec, jan?

## 2013-01-09 NOTE — Telephone Encounter (Signed)
Done!  EBM

## 2013-01-13 DIAGNOSIS — N318 Other neuromuscular dysfunction of bladder: Secondary | ICD-10-CM | POA: Diagnosis not present

## 2013-01-13 DIAGNOSIS — R350 Frequency of micturition: Secondary | ICD-10-CM | POA: Diagnosis not present

## 2013-01-27 ENCOUNTER — Other Ambulatory Visit: Payer: Self-pay | Admitting: Internal Medicine

## 2013-02-09 DIAGNOSIS — N189 Chronic kidney disease, unspecified: Secondary | ICD-10-CM | POA: Diagnosis not present

## 2013-02-09 DIAGNOSIS — K219 Gastro-esophageal reflux disease without esophagitis: Secondary | ICD-10-CM | POA: Diagnosis not present

## 2013-02-09 DIAGNOSIS — I10 Essential (primary) hypertension: Secondary | ICD-10-CM | POA: Diagnosis not present

## 2013-02-09 DIAGNOSIS — E7800 Pure hypercholesterolemia, unspecified: Secondary | ICD-10-CM | POA: Diagnosis not present

## 2013-02-09 DIAGNOSIS — I209 Angina pectoris, unspecified: Secondary | ICD-10-CM | POA: Diagnosis not present

## 2013-02-09 DIAGNOSIS — I2510 Atherosclerotic heart disease of native coronary artery without angina pectoris: Secondary | ICD-10-CM | POA: Diagnosis not present

## 2013-02-25 ENCOUNTER — Other Ambulatory Visit: Payer: Self-pay | Admitting: Internal Medicine

## 2013-03-03 ENCOUNTER — Emergency Department (HOSPITAL_COMMUNITY)
Admission: EM | Admit: 2013-03-03 | Discharge: 2013-03-03 | Disposition: A | Discharge status: Home / Self Care | Payer: Medicare Other | Attending: Emergency Medicine | Admitting: Emergency Medicine

## 2013-03-03 ENCOUNTER — Encounter (HOSPITAL_COMMUNITY): Payer: Self-pay | Admitting: Emergency Medicine

## 2013-03-03 DIAGNOSIS — Z7901 Long term (current) use of anticoagulants: Secondary | ICD-10-CM | POA: Diagnosis not present

## 2013-03-03 DIAGNOSIS — N183 Chronic kidney disease, stage 3 unspecified: Secondary | ICD-10-CM | POA: Insufficient documentation

## 2013-03-03 DIAGNOSIS — R05 Cough: Secondary | ICD-10-CM | POA: Insufficient documentation

## 2013-03-03 DIAGNOSIS — E669 Obesity, unspecified: Secondary | ICD-10-CM | POA: Insufficient documentation

## 2013-03-03 DIAGNOSIS — R6889 Other general symptoms and signs: Secondary | ICD-10-CM | POA: Diagnosis not present

## 2013-03-03 DIAGNOSIS — R059 Cough, unspecified: Secondary | ICD-10-CM | POA: Diagnosis not present

## 2013-03-03 DIAGNOSIS — Z8669 Personal history of other diseases of the nervous system and sense organs: Secondary | ICD-10-CM | POA: Diagnosis not present

## 2013-03-03 DIAGNOSIS — Y929 Unspecified place or not applicable: Secondary | ICD-10-CM | POA: Insufficient documentation

## 2013-03-03 DIAGNOSIS — I509 Heart failure, unspecified: Secondary | ICD-10-CM | POA: Insufficient documentation

## 2013-03-03 DIAGNOSIS — M542 Cervicalgia: Secondary | ICD-10-CM | POA: Diagnosis not present

## 2013-03-03 DIAGNOSIS — S139XXA Sprain of joints and ligaments of unspecified parts of neck, initial encounter: Secondary | ICD-10-CM | POA: Insufficient documentation

## 2013-03-03 DIAGNOSIS — X58XXXA Exposure to other specified factors, initial encounter: Secondary | ICD-10-CM | POA: Insufficient documentation

## 2013-03-03 DIAGNOSIS — J45901 Unspecified asthma with (acute) exacerbation: Secondary | ICD-10-CM | POA: Insufficient documentation

## 2013-03-03 DIAGNOSIS — Z86718 Personal history of other venous thrombosis and embolism: Secondary | ICD-10-CM | POA: Insufficient documentation

## 2013-03-03 DIAGNOSIS — R509 Fever, unspecified: Secondary | ICD-10-CM | POA: Diagnosis not present

## 2013-03-03 DIAGNOSIS — I129 Hypertensive chronic kidney disease with stage 1 through stage 4 chronic kidney disease, or unspecified chronic kidney disease: Secondary | ICD-10-CM | POA: Insufficient documentation

## 2013-03-03 DIAGNOSIS — F329 Major depressive disorder, single episode, unspecified: Secondary | ICD-10-CM | POA: Insufficient documentation

## 2013-03-03 DIAGNOSIS — I2510 Atherosclerotic heart disease of native coronary artery without angina pectoris: Secondary | ICD-10-CM | POA: Insufficient documentation

## 2013-03-03 DIAGNOSIS — E785 Hyperlipidemia, unspecified: Secondary | ICD-10-CM | POA: Insufficient documentation

## 2013-03-03 DIAGNOSIS — Z95818 Presence of other cardiac implants and grafts: Secondary | ICD-10-CM | POA: Insufficient documentation

## 2013-03-03 DIAGNOSIS — IMO0002 Reserved for concepts with insufficient information to code with codable children: Secondary | ICD-10-CM | POA: Insufficient documentation

## 2013-03-03 DIAGNOSIS — F3289 Other specified depressive episodes: Secondary | ICD-10-CM | POA: Diagnosis not present

## 2013-03-03 DIAGNOSIS — Y939 Activity, unspecified: Secondary | ICD-10-CM | POA: Insufficient documentation

## 2013-03-03 DIAGNOSIS — Z79899 Other long term (current) drug therapy: Secondary | ICD-10-CM | POA: Diagnosis not present

## 2013-03-03 DIAGNOSIS — S161XXA Strain of muscle, fascia and tendon at neck level, initial encounter: Secondary | ICD-10-CM

## 2013-03-03 LAB — CBC WITH DIFFERENTIAL/PLATELET
BASOS ABS: 0 10*3/uL (ref 0.0–0.1)
BASOS PCT: 1 % (ref 0–1)
EOS ABS: 0 10*3/uL (ref 0.0–0.7)
Eosinophils Relative: 1 % (ref 0–5)
HCT: 40.2 % (ref 36.0–46.0)
Hemoglobin: 14 g/dL (ref 12.0–15.0)
Lymphocytes Relative: 29 % (ref 12–46)
Lymphs Abs: 1.8 10*3/uL (ref 0.7–4.0)
MCH: 31.5 pg (ref 26.0–34.0)
MCHC: 34.8 g/dL (ref 30.0–36.0)
MCV: 90.5 fL (ref 78.0–100.0)
MONOS PCT: 7 % (ref 3–12)
Monocytes Absolute: 0.4 10*3/uL (ref 0.1–1.0)
NEUTROS PCT: 63 % (ref 43–77)
Neutro Abs: 3.9 10*3/uL (ref 1.7–7.7)
PLATELETS: 234 10*3/uL (ref 150–400)
RBC: 4.44 MIL/uL (ref 3.87–5.11)
RDW: 13.3 % (ref 11.5–15.5)
WBC: 6.2 10*3/uL (ref 4.0–10.5)

## 2013-03-03 LAB — BASIC METABOLIC PANEL
BUN: 23 mg/dL (ref 6–23)
CO2: 24 mEq/L (ref 19–32)
Calcium: 9.8 mg/dL (ref 8.4–10.5)
Chloride: 102 mEq/L (ref 96–112)
Creatinine, Ser: 1.77 mg/dL — ABNORMAL HIGH (ref 0.50–1.10)
GFR, EST AFRICAN AMERICAN: 35 mL/min — AB (ref >90)
GFR, EST NON AFRICAN AMERICAN: 30 mL/min — AB (ref >90)
Glucose, Bld: 113 mg/dL — ABNORMAL HIGH (ref 70–99)
POTASSIUM: 4.5 meq/L (ref 3.7–5.3)
SODIUM: 141 meq/L (ref 137–147)

## 2013-03-03 MED ORDER — MORPHINE SULFATE 4 MG/ML IJ SOLN
4.0000 mg | Freq: Once | INTRAMUSCULAR | Status: AC
  Administered 2013-03-03: 4 mg via INTRAVENOUS
  Filled 2013-03-03: qty 1

## 2013-03-03 MED ORDER — CYCLOBENZAPRINE HCL 10 MG PO TABS
5.0000 mg | ORAL_TABLET | Freq: Once | ORAL | Status: AC
  Administered 2013-03-03: 5 mg via ORAL
  Filled 2013-03-03: qty 1

## 2013-03-03 MED ORDER — OXYCODONE-ACETAMINOPHEN 5-325 MG PO TABS: 1.0000 | ORAL_TABLET | Freq: Four times a day (QID) | ORAL | Status: DC | PRN

## 2013-03-03 MED ORDER — OXYCODONE-ACETAMINOPHEN 5-325 MG PO TABS
1.0000 | ORAL_TABLET | Freq: Once | ORAL | Status: AC
  Administered 2013-03-03: 1 via ORAL
  Filled 2013-03-03: qty 1

## 2013-03-03 MED ORDER — SODIUM CHLORIDE 0.9 % IV BOLUS (SEPSIS)
500.0000 mL | INTRAVENOUS | Status: AC
  Administered 2013-03-03: 500 mL via INTRAVENOUS

## 2013-03-03 MED ORDER — CYCLOBENZAPRINE HCL 10 MG PO TABS: 5.0000 mg | ORAL_TABLET | Freq: Two times a day (BID) | ORAL | Status: DC | PRN

## 2013-03-03 MED ORDER — DIPHENHYDRAMINE HCL 25 MG PO CAPS
25.0000 mg | ORAL_CAPSULE | Freq: Once | ORAL | Status: AC
  Administered 2013-03-03: 25 mg via ORAL
  Filled 2013-03-03: qty 1

## 2013-03-03 NOTE — ED Provider Notes (Signed)
CSN: [id number]     Arrival date & time 03/03/13  1633 History   First MD Initiated Contact with Patient 03/03/13 2105     Chief Complaint  Patient presents with  . Neck Pain  . Fever  . Cough   (Consider location/radiation/quality/duration/timing/severity/associated sxs/prior Treatment) Patient is a 63 y.o. female presenting with neck pain, fever, and cough. The history is provided by the patient.  Neck Pain Pain location:  R side Quality:  Cramping Pain radiates to:  Does not radiate Pain severity:  Moderate Pain is:  Same all the time Onset quality:  Gradual Duration:  16 hours Timing:  Constant Progression:  Worsening Chronicity:  New Context comment:  Upon awakening Relieved by:  Nothing Worsened by:  Nothing tried Associated symptoms: fever (subjective fever several days ago)   Associated symptoms: no chest pain and no headaches   Fever Associated symptoms: cough (mild cough which is improving)   Associated symptoms: no chest pain, no congestion, no diarrhea, no dysuria, no headaches, no nausea and no vomiting   Cough Associated symptoms: fever (subjective fever several days ago)   Associated symptoms: no chest pain, no headaches and no shortness of breath     Past Medical History  Diagnosis Date  . Hyperlipidemia   . Hypertension   . Gout   . Obesity   . Depression   . DJD (degenerative joint disease)   . OSA (obstructive sleep apnea)     previously used CPAP, has lost  . CAD (coronary artery disease)     s/p non-Q wave MI in 06/2001 and 2004, 2005  . ALCOHOL ABUSE 12/13/2005    Annotation: Sober since 11/06 Qualifier: Diagnosis of  By: Wallace CullensGray MD, Natalia LeatherwoodKatherine    . INTRINSIC ASTHMA, WITH EXACERBATION 09/27/2009    Qualifier: Diagnosis of  By: Denton MeekKarimova MD, Tillie RungNodira    . Chronic kidney disease (CKD), stage III (moderate) 09/19/2010  . CHF (congestive heart failure)   . DVT of lower extremity, bilateral 04/04/2012    On Xarelto     Past Surgical History  Procedure  Laterality Date  . Partial hysterectomy      due to endometriosis  . Tubal ligation    . Cardiac catheterization      06/2001, 02/2002, 10/2004 (10-15% proximal stenosis of circumflex, diffuse disease of  OM1 and RCA)   Family History  Problem Relation Age of Onset  . Mental illness Mother   . Heart disease Father   . Diabetes Sister   . Diabetes Sister   . Diabetes Sister    History  Substance Use Topics  . Smoking status: Never Smoker   . Smokeless tobacco: Not on file  . Alcohol Use: No     Comment: No alcohol in 30+ days.   OB History   Grav Para Term Preterm Abortions TAB SAB Ect Mult Living                 Review of Systems  Constitutional: Positive for fever (subjective fever several days ago). Negative for fatigue.  HENT: Negative for congestion and drooling.   Eyes: Negative for pain.  Respiratory: Positive for cough (mild cough which is improving). Negative for shortness of breath.   Cardiovascular: Negative for chest pain.  Gastrointestinal: Negative for nausea, vomiting, abdominal pain and diarrhea.  Genitourinary: Negative for dysuria and hematuria.  Musculoskeletal: Positive for neck pain. Negative for back pain and gait problem.  Skin: Negative for color change.  Neurological: Negative for dizziness and headaches.  Hematological: Negative for adenopathy.  Psychiatric/Behavioral: Negative for behavioral problems.  All other systems reviewed and are negative.    Allergies  Ace inhibitors; Hydromorphone hcl; Percocet; Propoxyphene n-acetaminophen; and Vicodin  Home Medications   Current Outpatient Rx  Name  Route  Sig  Dispense  Refill  . acetaminophen (TYLENOL) 500 MG tablet   Oral   Take 1,000 mg by mouth every 6 (six) hours as needed for moderate pain.         Marland Kitchen amLODipine (NORVASC) 10 MG tablet   Oral   Take 10 mg by mouth daily.         Marland Kitchen atorvastatin (LIPITOR) 40 MG tablet   Oral   Take 1 tablet (40 mg total) by mouth daily.   30  tablet   11   . buPROPion (WELLBUTRIN XL) 150 MG 24 hr tablet   Oral   Take 1 tablet (150 mg total) by mouth every morning.   90 tablet   3   . fluticasone (FLONASE) 50 MCG/ACT nasal spray   Nasal   Place 2 sprays into the nose daily.         Marland Kitchen losartan (COZAAR) 100 MG tablet   Oral   Take 1 tablet (100 mg total) by mouth 2 (two) times daily.   60 tablet   3   . metoprolol (LOPRESSOR) 50 MG tablet   Oral   Take 2 tablets (100 mg total) by mouth 2 (two) times daily.   120 tablet   3   . nitroGLYCERIN (NITROSTAT) 0.4 MG SL tablet   Sublingual   Place 0.4 mg under the tongue every 5 (five) minutes as needed for chest pain.         . Nutritional Supplements (COLD AND FLU PO)   Oral   Take 30 mLs by mouth daily as needed (for cold).         Marland Kitchen omeprazole (PRILOSEC) 40 MG capsule   Oral   Take 1 capsule (40 mg total) by mouth 2 (two) times daily.   60 capsule   3   . oxyCODONE-acetaminophen (PERCOCET/ROXICET) 5-325 MG per tablet   Oral   Take 1-2 tablets by mouth every 12 (twelve) hours as needed for severe pain.         . Rivaroxaban (XARELTO) 20 MG TABS tablet   Oral   Take 20 mg by mouth daily with supper.         . TOVIAZ 8 MG TB24   Oral   Take 8 mg by mouth Daily.         . traZODone (DESYREL) 50 MG tablet   Oral   Take 25 mg by mouth at bedtime.          BP 144/89  Pulse 91  Temp(Src) 97.9 F (36.6 C) (Oral)  Resp 20  SpO2 98% Physical Exam  Nursing note and vitals reviewed. Constitutional: She is oriented to person, place, and time. She appears well-developed and well-nourished.  HENT:  Head: Normocephalic.  Mouth/Throat: Oropharynx is clear and moist. No oropharyngeal exudate.  Eyes: Conjunctivae and EOM are normal. Pupils are equal, round, and reactive to light.  Neck: Normal range of motion. Neck supple.  The patient has tenderness to palpation of the right sternocleidomastoid muscle. She is holding her head to the left. She has  limited range of motion to the right due to pain. No obvious masses or swelling is noted in the neck.  Cardiovascular: Normal rate, regular rhythm,  normal heart sounds and intact distal pulses.  Exam reveals no gallop and no friction rub.   No murmur heard. Pulmonary/Chest: Effort normal and breath sounds normal. No respiratory distress. She has no wheezes.  Abdominal: Soft. Bowel sounds are normal. There is no tenderness. There is no rebound and no guarding.  Musculoskeletal: Normal range of motion. She exhibits no edema and no tenderness.  Neurological: She is alert and oriented to person, place, and time.  Skin: Skin is warm and dry.  Psychiatric: She has a normal mood and affect. Her behavior is normal.    ED Course  Procedures (including critical care time) Labs Review Labs Reviewed  BASIC METABOLIC PANEL - Abnormal; Notable for the following:    Glucose, Bld 113 (*)    Creatinine, Ser 1.77 (*)    GFR calc non Af Amer 30 (*)    GFR calc Af Amer 35 (*)    All other components within normal limits  CBC WITH DIFFERENTIAL   Imaging Review No results found.  EKG Interpretation   None       MDM   1. Neck strain, initial encounter    9:30 PM 63 y.o. female on xarelto for dvt who presents with right lateral neck pain which was initially noticed upon awakening this morning. She denies any injury and states that she has no other neck pain previously. She states that several days ago she had a mild cold, cough, and subjective fever. She has not had any documented fevers and denies any fevers today. She is afebrile and vital signs are unremarkable here. She is complaining of a headache and right lateral neck pain on exam. She is holding her head to the left and states that with any movement she has pain on the right lateral neck. I have a low suspicion for infection as the patient had this upon awakening and is afebrile here. Will get screening labwork and initiate pain control with  Flexeril and a Percocet. The patient states she has not taken anything for the pain at home today.  11:01 PM: Labs show renal insuff which is known and proximal to baseline. Otherwise labs non-contrib. VS unremarkable here. Doubt infectious cause of pt's sx. I suspect a muscle strain w/ likely component of muscle spasms. Her pain has improved w/ pain medicine here and rom of the neck has mildly improved as well.  I have discussed the diagnosis/risks/treatment options with the patient and believe the pt to be eligible for discharge home to follow-up with pcp in 1 day. We also discussed returning to the ED immediately if new or worsening sx occur. We discussed the sx which are most concerning (e.g., fever, worsening pain, sob, difficulty swallowing) that necessitate immediate return. Any new prescriptions provided to the patient are listed below.  Discharge Medication List as of 03/03/2013 11:04 PM    START taking these medications   Details  cyclobenzaprine (FLEXERIL) 10 MG tablet Take 0.5 tablets (5 mg total) by mouth 2 (two) times daily as needed for muscle spasms., Starting 03/03/2013, Until Discontinued, Print    !! oxyCODONE-acetaminophen (PERCOCET) 5-325 MG per tablet Take 1 tablet by mouth every 6 (six) hours as needed for moderate pain., Starting 03/03/2013, Until Discontinued, Print     !! - Potential duplicate medications found. Please discuss with provider.       Junius Argyle, MD 03/04/13 [phone]

## 2013-03-03 NOTE — ED Notes (Signed)
Pt is moaning or saying "Ay [employer] Script[employer] yi yi yi" with breaths due to pain in neck. Pt complains of cramping on the right side of her abdomen.

## 2013-03-03 NOTE — ED Notes (Signed)
Pt is here with right neck pain and had some fever and cough 2 days that has resolved.  Pt states she woke up this way today.

## 2013-03-03 NOTE — Discharge Instructions (Signed)

## 2013-03-04 ENCOUNTER — Other Ambulatory Visit: Payer: Self-pay | Admitting: Internal Medicine

## 2013-03-05 DIAGNOSIS — I10 Essential (primary) hypertension: Secondary | ICD-10-CM | POA: Diagnosis not present

## 2013-03-05 DIAGNOSIS — N183 Chronic kidney disease, stage 3 unspecified: Secondary | ICD-10-CM | POA: Diagnosis not present

## 2013-03-05 DIAGNOSIS — R609 Edema, unspecified: Secondary | ICD-10-CM | POA: Diagnosis not present

## 2013-03-25 ENCOUNTER — Other Ambulatory Visit: Payer: Self-pay | Admitting: Internal Medicine

## 2013-03-25 ENCOUNTER — Other Ambulatory Visit (HOSPITAL_COMMUNITY): Payer: Self-pay | Admitting: Internal Medicine

## 2013-04-13 ENCOUNTER — Other Ambulatory Visit: Payer: Self-pay | Admitting: *Deleted

## 2013-04-13 DIAGNOSIS — M1990 Unspecified osteoarthritis, unspecified site: Secondary | ICD-10-CM

## 2013-04-15 MED ORDER — OXYCODONE-ACETAMINOPHEN 5-325 MG PO TABS: 1.0000 | ORAL_TABLET | Freq: Two times a day (BID) | ORAL | Status: DC | PRN

## 2013-04-15 NOTE — Telephone Encounter (Signed)
Pls sch [facility] appt. [facility]

## 2013-05-01 ENCOUNTER — Other Ambulatory Visit (HOSPITAL_COMMUNITY): Payer: Self-pay | Admitting: Internal Medicine

## 2013-05-14 ENCOUNTER — Encounter: Payer: Self-pay | Admitting: Internal Medicine

## 2013-05-14 ENCOUNTER — Ambulatory Visit (INDEPENDENT_AMBULATORY_CARE_PROVIDER_SITE_OTHER): Payer: Medicare Other | Admitting: Internal Medicine

## 2013-05-14 VITALS — BP 112/78 | HR 63 | Temp 97.8°F | Ht 62.0 in | Wt 273.9 lb

## 2013-05-14 DIAGNOSIS — I10 Essential (primary) hypertension: Secondary | ICD-10-CM

## 2013-05-14 DIAGNOSIS — F331 Major depressive disorder, recurrent, moderate: Secondary | ICD-10-CM

## 2013-05-14 DIAGNOSIS — N183 Chronic kidney disease, stage 3 unspecified: Secondary | ICD-10-CM

## 2013-05-14 DIAGNOSIS — I129 Hypertensive chronic kidney disease with stage 1 through stage 4 chronic kidney disease, or unspecified chronic kidney disease: Secondary | ICD-10-CM

## 2013-05-14 DIAGNOSIS — G4733 Obstructive sleep apnea (adult) (pediatric): Secondary | ICD-10-CM | POA: Diagnosis not present

## 2013-05-14 MED ORDER — BUPROPION HCL ER (XL) 150 MG PO TB24: 150.0000 mg | ORAL_TABLET | ORAL | Status: DC

## 2013-05-14 MED ORDER — LOSARTAN POTASSIUM 100 MG PO TABS: 100.0000 mg | ORAL_TABLET | Freq: Two times a day (BID) | ORAL | Status: DC

## 2013-05-14 NOTE — Assessment & Plan Note (Signed)
Has not been using the cpap machine for years.

## 2013-05-14 NOTE — Patient Instructions (Addendum)
General Instructions:  We have refilled your Losartan and Wellbutrin today. Keep your appointments with the [facility]. Thank you for bringing your medicines today. This helps us keep you safe from mistakes.    Treatment Goals:  Goals (1 Years of Data) as of 05/14/13         As of Today 03/03/13 03/03/13 03/03/13 03/03/13     Blood Pressure    . Blood Pressure < 140/90  112/78 153/87 154/81 144/89 126/91      Progress Toward Treatment Goals:  Treatment Goal 05/14/2013  Blood pressure at goal  Prevent falls deteriorated    Self Care Goals & Plans:  Self Care Goal 12/08/2012  Manage my medications take my medicines as prescribed; bring my medications to every visit; refill my medications on time  Monitor my health keep track of my weight  Eat healthy foods eat foods that are low in salt; eat baked foods instead of fried foods  Be physically active find an activity I enjoy  Prevent falls -    No flowsheet data found.   Care Management & Community Referrals:  Referral 09/15/2012  Referrals made for care management support none needed

## 2013-05-14 NOTE — Assessment & Plan Note (Addendum)
BP Readings from Last 3 Encounters:  05/14/13 112/78  03/03/13 153/87  12/08/12 128/86    Lab Results  Component Value Date   NA 141 03/03/2013   K 4.5 03/03/2013   CREATININE 1.77* 03/03/2013    Assessment: Blood pressure control: controlled Progress toward BP goal:  at goal Comments: chlorthalidone 25 mg qd added March 2015 to regimen per Renal  Plan: Medications:  continue current medications Educational resources provided:   Self management tools provided:   Other plans: cont amlodipine 10 mg qd, losartan 100 mg qd, metoprolol 50 mg qd, and chlorthalidone 25 mg qd      Case discussed with Dr. Bosie ClosSchooler soon after the resident saw the patient.  We reviewed the resident's history and exam and pertinent patient test results.  I agree with the assessment, diagnosis, and plan of care documented in the resident's note.

## 2013-05-14 NOTE — Progress Notes (Signed)
   Subjective:    Patient ID: Vanessa Palmer, female    DOB: Jul 25, 1950, 63 y.o.   MRN: [id number]  HPI  Presents for general follow-up and medication refill.  Hx significant for hypertension, CKD Stage 3, OSA.  States that she last was evaluated by her new "Kidney Doctor, Dr. Marisue HumbleSanford" in January 2015 and he informed her that she likely will not need hemodialysis. States that she has problems getting back to sleep once she wakes up to use the bathroom at 3 or 4 in the morning.  Review of Systems  Constitutional: Negative.   HENT: Negative.   Eyes: Negative.   Respiratory: Negative for shortness of breath.   Cardiovascular: Negative for chest pain, palpitations and leg swelling.  Gastrointestinal: Negative for nausea, vomiting, diarrhea and constipation.  Genitourinary: Negative for dysuria.  Musculoskeletal: Positive for arthralgias and back pain.  Skin: Negative.   Psychiatric/Behavioral: Negative for dysphoric mood.       Have some "rough days when the weather is bad".       Objective:   Physical Exam  Constitutional: She is oriented to person, place, and time. She appears well-developed and well-nourished. No distress.  HENT:  Head: Normocephalic and atraumatic.  Eyes: Conjunctivae and EOM are normal. Pupils are equal, round, and reactive to light.  Neck: Normal range of motion.  Cardiovascular: Normal rate, regular rhythm and normal heart sounds.   Pulmonary/Chest: Effort normal and breath sounds normal.  Abdominal: Soft. Bowel sounds are normal.  Musculoskeletal: Normal range of motion.  Neurological: She is alert and oriented to person, place, and time.  Skin: Skin is warm and dry.  Psychiatric: She has a normal mood and affect.          Assessment & Plan:  See separate problem based listing:

## 2013-05-18 DIAGNOSIS — I10 Essential (primary) hypertension: Secondary | ICD-10-CM | POA: Diagnosis not present

## 2013-05-18 DIAGNOSIS — I2510 Atherosclerotic heart disease of native coronary artery without angina pectoris: Secondary | ICD-10-CM | POA: Diagnosis not present

## 2013-05-18 DIAGNOSIS — M109 Gout, unspecified: Secondary | ICD-10-CM | POA: Diagnosis not present

## 2013-05-18 DIAGNOSIS — E7800 Pure hypercholesterolemia, unspecified: Secondary | ICD-10-CM | POA: Diagnosis not present

## 2013-05-18 NOTE — Assessment & Plan Note (Signed)
Last Renal appt Jan 2015.  Creatinine stable at 1.77  BMET    Component Value Date/Time   NA 141 03/03/2013 2128   K 4.5 03/03/2013 2128   CL 102 03/03/2013 2128   CO2 24 03/03/2013 2128   GLUCOSE 113* 03/03/2013 2128   BUN 23 03/03/2013 2128   CREATININE 1.77* 03/03/2013 2128   CREATININE 1.80* 12/08/2012 1144   CALCIUM 9.8 03/03/2013 2128   GFRNONAA 30* 03/03/2013 2128   GFRAA 35* 03/03/2013 2128

## 2013-06-01 ENCOUNTER — Other Ambulatory Visit: Payer: Self-pay | Admitting: Internal Medicine

## 2013-06-02 ENCOUNTER — Encounter: Payer: Self-pay | Admitting: Internal Medicine

## 2013-06-02 ENCOUNTER — Ambulatory Visit (INDEPENDENT_AMBULATORY_CARE_PROVIDER_SITE_OTHER): Payer: Medicare Other | Admitting: Internal Medicine

## 2013-06-02 VITALS — BP 116/78 | HR 58 | Temp 96.4°F | Ht 62.0 in | Wt 282.1 lb

## 2013-06-02 DIAGNOSIS — M1990 Unspecified osteoarthritis, unspecified site: Secondary | ICD-10-CM | POA: Diagnosis not present

## 2013-06-02 DIAGNOSIS — I10 Essential (primary) hypertension: Secondary | ICD-10-CM | POA: Diagnosis not present

## 2013-06-02 DIAGNOSIS — N183 Chronic kidney disease, stage 3 unspecified: Secondary | ICD-10-CM | POA: Diagnosis not present

## 2013-06-02 DIAGNOSIS — J45909 Unspecified asthma, uncomplicated: Secondary | ICD-10-CM

## 2013-06-02 DIAGNOSIS — R609 Edema, unspecified: Secondary | ICD-10-CM

## 2013-06-02 DIAGNOSIS — R0602 Shortness of breath: Secondary | ICD-10-CM | POA: Diagnosis not present

## 2013-06-02 DIAGNOSIS — R600 Localized edema: Secondary | ICD-10-CM | POA: Insufficient documentation

## 2013-06-02 LAB — BASIC METABOLIC PANEL WITH GFR
BUN: 22 mg/dL (ref 6–23)
CHLORIDE: 100 meq/L (ref 96–112)
CO2: 27 mEq/L (ref 19–32)
CREATININE: 1.84 mg/dL — AB (ref 0.50–1.10)
Calcium: 8.6 mg/dL (ref 8.4–10.5)
GFR, EST NON AFRICAN AMERICAN: 29 mL/min — AB
GFR, Est African American: 34 mL/min — ABNORMAL LOW
Glucose, Bld: 101 mg/dL — ABNORMAL HIGH (ref 70–99)
Potassium: 4.5 mEq/L (ref 3.5–5.3)
SODIUM: 135 meq/L (ref 135–145)

## 2013-06-02 MED ORDER — FLUTICASONE PROPIONATE HFA 44 MCG/ACT IN AERO: 2.0000 | INHALATION_SPRAY | Freq: Two times a day (BID) | RESPIRATORY_TRACT | Status: DC

## 2013-06-02 NOTE — Progress Notes (Signed)
Subjective:    Patient ID: Vanessa Palmer, female    DOB: [birth year], 63 y.o.   MRN: [id number]  HPI Comments: 63 y.o Past Medical History Hyperlipidemia, Hypertension (116/78), Gout, Obesity, Depression, DJD (degenerative joint disease), OSA (obstructive sleep apnea-not using cpap needs a cord to plug it up. It has not been 5 years since last sleep study so cant get another machine), CAD (coronary artery disease), ALCOHOL ABUSE, asthma, chronic kidney disease (CKD) 3, CHF (congestive heart failure), DVT of lower extremity, bilateral (on Xarelto), back pain, dyspepsia, chronic pelvic pain, pes planus, h/o shingles, h/o urinary incontinence, 12/2011 EF 55-60%.   She presents for f/u for:  1) She needs Rx refill of Flovent bid for asthma.  She is not currently using and needs Rx reifill.  She has not been using it for a while.   2) She has lower extremity edema x 1 week.  She is also following with renal who put her on a medication for swelling.  She is not sure the name of the medication and did not bring her medications with her today but states she brought it previously.  Per chart review it may be chlorthalidone 25 mg daily. She reports compliance. She is supposed to f/u with renal in 08/2013.   3) She c/o sob with exertion and leg edema x 1 week.  She has been walking more with a cane since her electric wheelchair has been broken.  She would like Dr. Criselda Peaches to try to work on getting her another Mining engineer wheelchair.  SOB improves after she sits to rest.  She denies increased salt.  She has not tried anything to help her sob and reports compliance with her current medications.   4) History of CKD 3 last Cr 02/2012 was 1.77.  BL 1.3-1.5.  Will check another BMET today  5) She has cough with clear phelgm   SH: lives alone. 2/3 kids living, 11 grandkids, 6 great grand kids, one great grandson expected to be delivered today     Review of Systems  Respiratory: Positive for cough and shortness of  breath.   Cardiovascular: Positive for leg swelling. Negative for chest pain.  Gastrointestinal: Negative for abdominal pain.       Objective:   Physical Exam  Nursing note and vitals reviewed. Constitutional: She is oriented to person, place, and time. Vital signs are normal. She appears well-developed and well-nourished. She is cooperative. No distress.  HENT:  Head: Normocephalic and atraumatic.  Mouth/Throat: Oropharynx is clear and moist and mucous membranes are normal. Abnormal dentition. No oropharyngeal exudate.  Eyes: Conjunctivae are normal. Pupils are equal, round, and reactive to light. Right eye exhibits no discharge. Left eye exhibits no discharge. No scleral icterus.  Cardiovascular: Normal rate, regular rhythm, S1 normal, S2 normal and normal heart sounds.   No murmur heard. 2+ lower ext edema b/l   Pulmonary/Chest: Effort normal and breath sounds normal. No respiratory distress. She has no wheezes.  Abdominal: Soft. Bowel sounds are normal. There is no tenderness.  Obese   Neurological: She is alert and oriented to person, place, and time.  Walks with cane  Skin: Skin is warm, dry and intact. No rash noted. She is not diaphoretic.  Psychiatric: She has a normal mood and affect. Her speech is normal and behavior is normal. Judgment and thought content normal. Cognition and memory are normal.          Assessment & Plan:  F/u in 3 months with  PCP unless labs indicate otherwise

## 2013-06-02 NOTE — Assessment & Plan Note (Signed)
Will rx Flovent 2 puffs bid as maintenance

## 2013-06-02 NOTE — Patient Instructions (Addendum)
General Instructions: Please read information below  Schedule follow up with Dr. Marisue HumbleSanford sooner than July 2015.   Please bring your medicines with you each time you come.   Medicines may be  Eye drops  Herbal   Vitamins  Pills  Seeing these help Vanessa Palmer take care of you. Follow up with your regular doctor in 3 months. I will send you a letter if I want to follow up sooner based on your labs   Treatment Goals:  Goals (1 Years of Data) as of 06/02/13         As of Today 05/14/13 03/03/13 03/03/13 03/03/13     Blood Pressure    . Blood Pressure < 140/90  116/78 112/78 153/87 154/81 144/89      Progress Toward Treatment Goals:  Treatment Goal 06/02/2013  Blood pressure at goal  Prevent falls at goal    Self Care Goals & Plans:  Self Care Goal 06/02/2013  Manage my medications take my medicines as prescribed; bring my medications to every visit; refill my medications on time; follow the sick day instructions if I am sick  Monitor my health keep track of my blood pressure; keep track of my weight  Eat healthy foods drink diet soda or water instead of juice or soda; eat more vegetables; eat foods that are low in salt; eat baked foods instead of fried foods; eat fruit for snacks and desserts; eat smaller portions  Be physically active find an activity I enjoy  Prevent falls -  Meeting treatment goals maintain the current self-care plan    No flowsheet data found.   Care Management & Community Referrals:  Referral 06/02/2013  Referrals made for care management support none needed  Referrals made to community resources none       Edema Edema is an abnormal build-up of fluids in tissues. Because this is partly dependent on gravity (water flows to the lowest place), it is more common in the legs and thighs (lower extremities). It is also common in the looser tissues, like around the eyes. Painless swelling of the feet and ankles is common and increases as a person ages. It may affect  both legs and may include the calves or even thighs. When squeezed, the fluid may move out of the affected area and may leave a dent for a few moments. CAUSES   Prolonged standing or sitting in one place for extended periods of time. Movement helps pump tissue fluid into the veins, and absence of movement prevents this, resulting in edema.  Varicose veins. The valves in the veins do not work as well as they should. This causes fluid to leak into the tissues.  Fluid and salt overload.  Injury, burn, or surgery to the leg, ankle, or foot, may damage veins and allow fluid to leak out.  Sunburn damages vessels. Leaky vessels allow fluid to go out into the sunburned tissues.  Allergies (from insect bites or stings, medications or chemicals) cause swelling by allowing vessels to become leaky.  Protein in the blood helps keep fluid in your vessels. Low protein, as in malnutrition, allows fluid to leak out.  Hormonal changes, including pregnancy and menstruation, cause fluid retention. This fluid may leak out of vessels and cause edema.  Medications that cause fluid retention. Examples are sex hormones, blood pressure medications, steroid treatment, or anti-depressants.  Some illnesses cause edema, especially heart failure, kidney disease, or liver disease.  Surgery that cuts veins or lymph nodes, such as surgery done  for the heart or for breast cancer, may result in edema. DIAGNOSIS  Your caregiver is usually easily able to determine what is causing your swelling (edema) by simply asking what is wrong (getting a history) and examining you (doing a physical). Sometimes x-rays, EKG (electrocardiogram or heart tracing), and blood work may be done to evaluate for underlying medical illness. TREATMENT  General treatment includes:  Leg elevation (or elevation of the affected body part).  Restriction of fluid intake.  Prevention of fluid overload.  Compression of the affected body part.  Compression with elastic bandages or support stockings squeezes the tissues, preventing fluid from entering and forcing it back into the blood vessels.  Diuretics (also called water pills or fluid pills) pull fluid out of your body in the form of increased urination. These are effective in reducing the swelling, but can have side effects and must be used only under your caregiver's supervision. Diuretics are appropriate only for some types of edema. The specific treatment can be directed at any underlying causes discovered. Heart, liver, or kidney disease should be treated appropriately. HOME CARE INSTRUCTIONS   Elevate the legs (or affected body part) above the level of the heart, while lying down.  Avoid sitting or standing still for prolonged periods of time.  Avoid putting anything directly under the knees when lying down, and do not wear constricting clothing or garters on the upper legs.  Exercising the legs causes the fluid to work back into the veins and lymphatic channels. This may help the swelling go down.  The pressure applied by elastic bandages or support stockings can help reduce ankle swelling.  A low-salt diet may help reduce fluid retention and decrease the ankle swelling.  Take any medications exactly as prescribed. SEEK MEDICAL CARE IF:  Your edema is not responding to recommended treatments. SEEK IMMEDIATE MEDICAL CARE IF:   You develop shortness of breath or chest pain.  You cannot breathe when you lay down; or if, while lying down, you have to get up and go to the window to get your breath.  You are having increasing swelling without relief from treatment.  You develop a fever over 102 F (38.9 C).  You develop pain or redness in the areas that are swollen.  Tell your caregiver right away if you have gained 03 lb/1.4 kg in 1 day or 05 lb/2.3 kg in a week. MAKE SURE YOU:   Understand these instructions.  Will watch your condition.  Will get help right  away if you are not doing well or get worse. Document Released: 02/12/2005 Document Revised: 08/14/2011 Document Reviewed: 10/01/2007 [facility] Patient Information 2014 [place], [place].  DASH Diet The DASH diet stands for "Dietary Approaches to Stop Hypertension." It is a healthy eating plan that has been shown to reduce high blood pressure (hypertension) in as little as 14 days, while also possibly providing other significant health benefits. These other health benefits include reducing the risk of breast cancer after menopause and reducing the risk of type 2 diabetes, heart disease, colon cancer, and stroke. Health benefits also include weight loss and slowing kidney failure in patients with chronic kidney disease.  DIET GUIDELINES  Limit salt (sodium). Your diet should contain less than 1500 mg of sodium daily.  Limit refined or processed carbohydrates. Your diet should include mostly whole grains. Desserts and added sugars should be used sparingly.  Include small amounts of heart-healthy fats. These types of fats include nuts, oils, and tub margarine. Limit saturated and  trans fats. These fats have been shown to be harmful in the body. CHOOSING FOODS  The following food groups are based on a 2000 calorie diet. See your Registered Dietitian for individual calorie needs. Grains and Grain Products (6 to 8 servings daily)  Eat More Often: Whole-wheat bread, brown rice, whole-grain or wheat pasta, quinoa, popcorn without added fat or salt (air popped).  Eat Less Often: White bread, white pasta, white rice, cornbread. Vegetables (4 to 5 servings daily)  Eat More Often: Fresh, frozen, and canned vegetables. Vegetables may be raw, steamed, roasted, or grilled with a minimal amount of fat.  Eat Less Often/Avoid: Creamed or fried vegetables. Vegetables in a cheese sauce. Fruit (4 to 5 servings daily)  Eat More Often: All fresh, canned (in natural juice), or frozen fruits. Dried fruits without  added sugar. One hundred percent fruit juice ( cup [237 mL] daily).  Eat Less Often: Dried fruits with added sugar. Canned fruit in light or heavy syrup. [employer], Fish, and Poultry (2 servings or less daily. One serving is 3 to 4 oz [85-114 g]).  Eat More Often: Ninety percent or leaner ground beef, tenderloin, sirloin. Round cuts of beef, chicken breast, [place] breast. All fish. Grill, bake, or broil your meat. Nothing should be fried.  Eat Less Often/Avoid: Fatty cuts of meat, [place], or chicken leg, thigh, or wing. Fried cuts of meat or fish. Dairy (2 to 3 servings)  Eat More Often: Low-fat or fat-free milk, low-fat plain or light yogurt, reduced-fat or part-skim cheese.  Eat Less Often/Avoid: Milk (whole, 2%).Whole milk yogurt. Full-fat cheeses. Nuts, Seeds, and Legumes (4 to 5 servings per week)  Eat More Often: All without added salt.  Eat Less Often/Avoid: Salted nuts and seeds, canned beans with added salt. Fats and Sweets (limited)  Eat More Often: Vegetable oils, tub margarines without trans fats, sugar-free gelatin. Mayonnaise and salad dressings.  Eat Less Often/Avoid: Coconut oils, palm oils, butter, stick margarine, cream, half and half, cookies, candy, pie. FOR MORE INFORMATION The Dash Diet Eating Plan: www.dashdiet.org Document Released: 02/01/2011 Document Revised: 05/07/2011 Document Reviewed: 02/01/2011 [facility] Patient Information 2014 [place], [place].  Hypertension As your heart beats, it forces blood through your arteries. This force is your blood pressure. If the pressure is too high, it is called hypertension (HTN) or high blood pressure. HTN is dangerous because you may have it and not know it. High blood pressure may mean that your heart has to work harder to pump blood. Your arteries may be narrow or stiff. The extra work puts you at risk for heart disease, stroke, and other problems.  Blood pressure consists of two numbers, a higher number over a lower,  110/72, for example. It is stated as "110 over 72." The ideal is below 120 for the top number (systolic) and under 80 for the bottom (diastolic). Write down your blood pressure today. You should pay close attention to your blood pressure if you have certain conditions such as:  Heart failure.  Prior heart attack.  Diabetes  Chronic kidney disease.  Prior stroke.  Multiple risk factors for heart disease. To see if you have HTN, your blood pressure should be measured while you are seated with your arm held at the level of the heart. It should be measured at least twice. A one-time elevated blood pressure reading (especially in the Emergency Department) does not mean that you need treatment. There may be conditions in which the blood pressure is different between your right  and left arms. It is important to see your caregiver soon for a recheck. Most people have essential hypertension which means that there is not a specific cause. This type of high blood pressure may be lowered by changing lifestyle factors such as:  Stress.  Smoking.  Lack of exercise.  Excessive weight.  Drug/tobacco/alcohol use.  Eating less salt. Most people do not have symptoms from high blood pressure until it has caused damage to the body. Effective treatment can often prevent, delay or reduce that damage. TREATMENT  When a cause has been identified, treatment for high blood pressure is directed at the cause. There are a large number of medications to treat HTN. These fall into several categories, and your caregiver will help you select the medicines that are best for you. Medications may have side effects. You should review side effects with your caregiver. If your blood pressure stays high after you have made lifestyle changes or started on medicines,   Your medication(s) may need to be changed.  Other problems may need to be addressed.  Be certain you understand your prescriptions, and know how and when  to take your medicine.  Be sure to follow up with your caregiver within the time frame advised (usually within two weeks) to have your blood pressure rechecked and to review your medications.  If you are taking more than one medicine to lower your blood pressure, make sure you know how and at what times they should be taken. Taking two medicines at the same time can result in blood pressure that is too low. SEEK IMMEDIATE MEDICAL CARE IF:  You develop a severe headache, blurred or changing vision, or confusion.  You have unusual weakness or numbness, or a faint feeling.  You have severe chest or abdominal pain, vomiting, or breathing problems. MAKE SURE YOU:   Understand these instructions.  Will watch your condition.  Will get help right away if you are not doing well or get worse. Document Released: 02/12/2005 Document Revised: 05/07/2011 Document Reviewed: 10/03/2007 [facility] Patient Information 2014 [place], [place].  Fluticasone inhalation aerosol What is this medicine? FLUTICASONE (floo TIK a sone) inhalation is a corticosteroid. It helps decrease inflammation in your lungs. This medicine is used to treat the symptoms of asthma. Never use this medicine for an acute asthma attack. This medicine may be used for other purposes; ask your health care provider or pharmacist if you have questions. COMMON BRAND NAME(S): Flovent HFA, Flovent What should I tell my health care provider before I take this medicine? They need to know if you have any of these conditions: -bone problems -immune system problems -infection, like chickenpox, tuberculosis, herpes, or fungal infection -recent surgery or injury of mouth or throat -taking corticosteroids by mouth -an unusual or allergic reaction to fluticasone, steroids, other medicines, foods, dyes, or preservatives -pregnant or trying to get pregnant -breast-feeding How should I use this medicine? This medicine is for inhalation through the  mouth. Rinse your mouth with water after use. Make sure not to swallow the water. Follow the directions on your prescription label. Do not use more often than directed. Do not stop taking your medicine unless your doctor tells you to. Make sure that you are using your inhaler correctly. Ask you doctor or health care provider if you have any questions. Talk to your pediatrician regarding the use of this medicine in children. Special care may be needed. While this drug may be prescribed for children as young as 54 years of age  for selected conditions, precautions do apply. Overdosage: If you think you have taken too much of this medicine contact a poison control center or emergency room at once. NOTE: This medicine is only for you. Do not share this medicine with others. What if I miss a dose? If you miss a dose, use it as soon as you remember. If it is almost time for your next dose, use only that dose and continue with your regular schedule, spacing doses evenly. Do not use double or extra doses. What may interact with this medicine? -ketoconazole -ritonavir This list may not describe all possible interactions. Give your health care provider a list of all the medicines, herbs, non-prescription drugs, or dietary supplements you use. Also tell them if you smoke, drink alcohol, or use illegal drugs. Some items may interact with your medicine. What should I watch for while using this medicine? Visit your doctor or health care professional for regular checks on your progress. Check with your health care professional if your symptoms do not improve. If your symptoms get worse or if you need your short-acting inhalers more often, call your doctor right away. Try not to come in contact with people who have chickenpox or the measles while you are taking this medicine. If you do, call your doctor right away. What side effects may I notice from receiving this medicine? Side effects that you should report to your  doctor or health care professional as soon as possible: -allergic reactions like skin rash, itching or hives -changes in vision -chest pain -flu-like symptoms -trouble breathing or wheezing -unusual swelling -white patches or sores in the mouth or throat Side effects that usually do not require medical attention (report to your doctor or health care professional if they continue or are bothersome): -coughing, hoarseness or throat irritation -dry mouth -flushing -headache -loss of taste, or unpleasant taste This list may not describe all possible side effects. Call your doctor for medical advice about side effects. You may report side effects to FDA at 1-800-FDA-1088. Where should I keep my medicine? Keep out of the reach of children. Store at room temperature between 15 and 30 degrees C (59 and 86 degrees F) with the mouthpiece down. Do not puncture the canister. Do not store it or use it near heat or an open flame. Exposure to temperatures above 120 degrees F may cause it to burst. Never throw it into a fire or incinerator. Throw away after the expiration date. NOTE: This sheet is a summary. It may not cover all possible information. If you have questions about this medicine, talk to your doctor, pharmacist, or health care provider.  2014, [employer]vier/[employer]. (2012-07-31 11:34:29)  Exercise to Lose Weight Exercise and a healthy diet may help you lose weight. Your doctor may suggest specific exercises. EXERCISE IDEAS AND TIPS  Choose low-cost things you enjoy doing, such as walking, bicycling, or exercising to workout videos.  Take stairs instead of the elevator.  Walk during your lunch break.  Park your car further away from work or school.  Go to a gym or an exercise class.  Start with 5 to 10 minutes of exercise each day. Build up to 30 minutes of exercise 4 to 6 days a week.  Wear shoes with good support and comfortable clothes.  Stretch before and after working  out.  Work out until you breathe harder and your heart beats faster.  Drink extra water when you exercise.  Do not do so much that you hurt  yourself, feel dizzy, or get very short of breath. Exercises that burn about 150 calories:  Running 1  miles in 15 minutes.  Playing volleyball for 45 to 60 minutes.  Washing and waxing a car for 45 to 60 minutes.  Playing touch football for 45 minutes.  Walking 1  miles in 35 minutes.  Pushing a stroller 1  miles in 30 minutes.  Playing basketball for 30 minutes.  Raking leaves for 30 minutes.  Bicycling 5 miles in 30 minutes.  Walking 2 miles in 30 minutes.  Dancing for 30 minutes.  Shoveling snow for 15 minutes.  Swimming laps for 20 minutes.  Walking up stairs for 15 minutes.  Bicycling 4 miles in 15 minutes.  Gardening for 30 to 45 minutes.  Jumping rope for 15 minutes.  Washing windows or floors for 45 to 60 minutes. Document Released: 03/17/2010 Document Revised: 05/07/2011 Document Reviewed: 03/17/2010 [facility] Patient Information 2014 [place], [place].  Shortness of Breath Shortness of breath means you have trouble breathing. Shortness of breath may indicate that you have a medical problem. You should seek immediate medical care for shortness of breath. CAUSES   Not enough oxygen in the air (as with high altitudes or a smoke-filled room).  Short-term (acute) lung disease, including:  Infections, such as pneumonia.  Fluid in the lungs, such as heart failure.  A blood clot in the lungs (pulmonary embolism).  Long-term (chronic) lung diseases.  Heart disease (heart attack, angina, heart failure, and others).  Low red blood cells (anemia).  Poor physical fitness. This can cause shortness of breath when you exercise.  Chest or back injuries or stiffness.  Being overweight.  Smoking.  Anxiety. This can make you feel like you are not getting enough air. DIAGNOSIS  Serious medical problems can  usually be found during your physical exam. Tests may also be done to determine why you are having shortness of breath. Tests may include:  Chest X-rays.  Lung function tests.  Blood tests.  Electrocardiography.  Exercise testing.  Echocardiography.  Imaging scans. Your caregiver may not be able to find a cause for your shortness of breath after your exam. In this case, it is important to have a follow-up exam with your caregiver as directed.  TREATMENT  Treatment for shortness of breath depends on the cause of your symptoms and can vary greatly. HOME CARE INSTRUCTIONS   Do not smoke. Smoking is a common cause of shortness of breath. If you smoke, ask for help to quit.  Avoid being around chemicals or things that may bother your breathing, such as paint fumes and dust.  Rest as needed. Slowly resume your usual activities.  If medicines were prescribed, take them as directed for the full length of time directed. This includes oxygen and any inhaled medicines.  Keep all follow-up appointments as directed by your caregiver. SEEK MEDICAL CARE IF:   Your condition does not improve in the time expected.  You have a hard time doing your normal activities even with rest.  You have any side effects or problems with the medicines prescribed.  You develop any new symptoms. SEEK IMMEDIATE MEDICAL CARE IF:   Your shortness of breath gets worse.  You feel lightheaded, faint, or develop a cough not controlled with medicines.  You start coughing up blood.  You have pain with breathing.  You have chest pain or pain in your arms, shoulders, or abdomen.  You have a fever.  You are unable to walk up stairs  or exercise the way you normally do. MAKE SURE YOU:  Understand these instructions.  Will watch your condition.  Will get help right away if you are not doing well or get worse. Document Released: 11/07/2000 Document Revised: 08/14/2011 Document Reviewed:  04/30/2011 [facility] Patient Information 2014 [place], [place].

## 2013-06-02 NOTE — Assessment & Plan Note (Addendum)
-  Patient has been  with maintenance Flovent for asthma Rx refill and encouraged compliance as maintenance  -Other etiology could be obesity and physical deconditioning as pt was using electric wheelchair and not walking as much. Electric wheelchair recently broken and she is walking with a cane. Encourage pt to walk to increase endurance of exercise.   -Patient reports compliance with her diuretic daily.   -RTC or ED if worsening

## 2013-06-02 NOTE — Assessment & Plan Note (Signed)
Will check BMET today  Patient has 2+ lower ext edema. She reports compliance with Chlorthalidone 25 mg dail Will ask that patient f/u with renal sooner than 08/2013

## 2013-06-02 NOTE — Assessment & Plan Note (Signed)
BP Readings from Last 3 Encounters:  06/02/13 116/78  05/14/13 112/78  03/03/13 153/87    Lab Results  Component Value Date   NA 141 03/03/2013   K 4.5 03/03/2013   CREATININE 1.77* 03/03/2013    Assessment: Blood pressure control: controlled Progress toward BP goal:  at goal Comments: none  Plan: Medications:  Norvasc 10 m, Cozaar 100 mg qd, Chlorthialidone 25 mg qd  Educational resources provided: brochure;other (see comments) Self management tools provided: home blood pressure logbook;other (see comments) Other plans: will check BMET today

## 2013-06-02 NOTE — Progress Notes (Signed)
Case discussed with Dr. McLean at time of visit.  We reviewed the resident's history and exam and pertinent patient test results.  I agree with the assessment, diagnosis, and plan of care documented in the resident's note.  

## 2013-06-02 NOTE — Assessment & Plan Note (Addendum)
Etiology less likely cardiac as echo 12/2011 with EF 55-60% no diastolic dysfunction reported.  Other etiology could be CKD, body habitus (obesity) Will encouraged leg elevation. Patient is already on a diuretic daily, encouraged compliance.   Will encourage patient to f/u with renal sooner than 08/2013. She may need Lasix vs Chlorthalidone

## 2013-06-03 ENCOUNTER — Encounter: Payer: Self-pay | Admitting: Internal Medicine

## 2013-06-05 ENCOUNTER — Other Ambulatory Visit: Payer: Self-pay | Admitting: *Deleted

## 2013-06-05 DIAGNOSIS — I10 Essential (primary) hypertension: Secondary | ICD-10-CM

## 2013-06-05 MED ORDER — METOPROLOL TARTRATE 50 MG PO TABS: 50.0000 mg | ORAL_TABLET | Freq: Every day | ORAL | Status: DC

## 2013-06-09 ENCOUNTER — Telehealth: Payer: Self-pay | Admitting: *Deleted

## 2013-06-09 NOTE — Telephone Encounter (Signed)
Pt called needs PA on Flovent inhaler per [facility]. Talked with pharmacy and will fax info on insurance to clinic.  Purnell ShoemakerKaye is aware of PA. Pt aware also. Stanton KidneyDebra Derral Colucci RN 06/09/13 9:30AM

## 2013-06-11 ENCOUNTER — Other Ambulatory Visit: Payer: Self-pay | Admitting: *Deleted

## 2013-06-11 ENCOUNTER — Telehealth: Payer: Self-pay | Admitting: *Deleted

## 2013-06-11 DIAGNOSIS — M1990 Unspecified osteoarthritis, unspecified site: Secondary | ICD-10-CM

## 2013-06-11 NOTE — Telephone Encounter (Addendum)
Received PA request from pt's pharmacy for her Flovent HFA.  Contacted [facility] at [phone] to initiate request.  Request was sent for review.  Insurance will notify office by fax in 24-72 hours on the decision.Vanessa Palmer.Maximus Hoffert C [name]4/16/201510:39 AM  (Of note, insurance stated that Qvar and pulmicort was the preferred, but they will also need prior authorizations.)    Case # [id number]

## 2013-06-11 NOTE — Telephone Encounter (Signed)
Please forward this Rx to Dr Kem KaysPaya who just saw this patient with the resident on 4/7

## 2013-06-11 NOTE — Telephone Encounter (Signed)
Dr Donnetta HutchingMullen's pt.

## 2013-06-12 ENCOUNTER — Other Ambulatory Visit: Payer: Self-pay | Admitting: Oncology

## 2013-06-12 DIAGNOSIS — M1990 Unspecified osteoarthritis, unspecified site: Secondary | ICD-10-CM

## 2013-06-12 MED ORDER — OXYCODONE-ACETAMINOPHEN 5-325 MG PO TABS: 1.0000 | ORAL_TABLET | Freq: Two times a day (BID) | ORAL | Status: DC | PRN

## 2013-06-17 NOTE — Telephone Encounter (Signed)
Received faxed documentation that request was approved through 02/25/2014.Melenie Minniear C [name]4/22/201512:48 PM

## 2013-07-03 ENCOUNTER — Other Ambulatory Visit: Payer: Self-pay | Admitting: Internal Medicine

## 2013-07-03 DIAGNOSIS — F331 Major depressive disorder, recurrent, moderate: Secondary | ICD-10-CM

## 2013-07-03 DIAGNOSIS — K219 Gastro-esophageal reflux disease without esophagitis: Secondary | ICD-10-CM

## 2013-07-10 ENCOUNTER — Other Ambulatory Visit: Payer: Self-pay | Admitting: *Deleted

## 2013-07-10 DIAGNOSIS — M1990 Unspecified osteoarthritis, unspecified site: Secondary | ICD-10-CM

## 2013-07-10 MED ORDER — OXYCODONE-ACETAMINOPHEN 5-325 MG PO TABS: 1.0000 | ORAL_TABLET | Freq: Two times a day (BID) | ORAL | Status: DC | PRN

## 2013-07-10 NOTE — Telephone Encounter (Signed)
Call from pt requesting medication refill.  Her PCP is currently out of the office, request sent to [facility] attending workqueue.Basil Buffin C [name]5/15/20154:37 PM

## 2013-07-15 ENCOUNTER — Other Ambulatory Visit: Payer: Self-pay | Admitting: *Deleted

## 2013-07-15 DIAGNOSIS — I10 Essential (primary) hypertension: Secondary | ICD-10-CM

## 2013-07-15 MED ORDER — METOPROLOL TARTRATE 50 MG PO TABS: 50.0000 mg | ORAL_TABLET | Freq: Every day | ORAL | Status: DC

## 2013-08-04 ENCOUNTER — Other Ambulatory Visit: Payer: Self-pay | Admitting: Internal Medicine

## 2013-08-10 ENCOUNTER — Other Ambulatory Visit: Payer: Self-pay | Admitting: *Deleted

## 2013-08-10 DIAGNOSIS — M1990 Unspecified osteoarthritis, unspecified site: Secondary | ICD-10-CM

## 2013-08-10 MED ORDER — OXYCODONE-ACETAMINOPHEN 5-325 MG PO TABS: 1.0000 | ORAL_TABLET | Freq: Two times a day (BID) | ORAL | Status: DC | PRN

## 2013-08-10 NOTE — Telephone Encounter (Signed)
Pt called/informed rx ready for pick up. 

## 2013-08-17 DIAGNOSIS — K219 Gastro-esophageal reflux disease without esophagitis: Secondary | ICD-10-CM | POA: Diagnosis not present

## 2013-08-17 DIAGNOSIS — E785 Hyperlipidemia, unspecified: Secondary | ICD-10-CM | POA: Diagnosis not present

## 2013-08-17 DIAGNOSIS — M159 Polyosteoarthritis, unspecified: Secondary | ICD-10-CM | POA: Diagnosis not present

## 2013-08-17 DIAGNOSIS — I252 Old myocardial infarction: Secondary | ICD-10-CM | POA: Diagnosis not present

## 2013-08-17 DIAGNOSIS — N189 Chronic kidney disease, unspecified: Secondary | ICD-10-CM | POA: Diagnosis not present

## 2013-08-17 DIAGNOSIS — I2510 Atherosclerotic heart disease of native coronary artery without angina pectoris: Secondary | ICD-10-CM | POA: Diagnosis not present

## 2013-08-17 DIAGNOSIS — I10 Essential (primary) hypertension: Secondary | ICD-10-CM | POA: Diagnosis not present

## 2013-08-17 DIAGNOSIS — E669 Obesity, unspecified: Secondary | ICD-10-CM | POA: Diagnosis not present

## 2013-08-28 ENCOUNTER — Other Ambulatory Visit: Payer: Self-pay | Admitting: Internal Medicine

## 2013-08-28 ENCOUNTER — Other Ambulatory Visit (HOSPITAL_COMMUNITY): Payer: Self-pay | Admitting: Internal Medicine

## 2013-09-09 ENCOUNTER — Encounter: Payer: Self-pay | Admitting: Internal Medicine

## 2013-09-09 ENCOUNTER — Ambulatory Visit (INDEPENDENT_AMBULATORY_CARE_PROVIDER_SITE_OTHER): Payer: Medicare Other | Admitting: Internal Medicine

## 2013-09-09 VITALS — BP 125/87 | HR 82 | Temp 97.8°F | Ht 62.0 in | Wt 267.2 lb

## 2013-09-09 DIAGNOSIS — Z86718 Personal history of other venous thrombosis and embolism: Secondary | ICD-10-CM

## 2013-09-09 DIAGNOSIS — R32 Unspecified urinary incontinence: Secondary | ICD-10-CM

## 2013-09-09 DIAGNOSIS — E785 Hyperlipidemia, unspecified: Secondary | ICD-10-CM | POA: Diagnosis not present

## 2013-09-09 DIAGNOSIS — M170 Bilateral primary osteoarthritis of knee: Secondary | ICD-10-CM

## 2013-09-09 DIAGNOSIS — G4733 Obstructive sleep apnea (adult) (pediatric): Secondary | ICD-10-CM | POA: Diagnosis not present

## 2013-09-09 DIAGNOSIS — N183 Chronic kidney disease, stage 3 unspecified: Secondary | ICD-10-CM | POA: Diagnosis not present

## 2013-09-09 DIAGNOSIS — I10 Essential (primary) hypertension: Secondary | ICD-10-CM

## 2013-09-09 DIAGNOSIS — K219 Gastro-esophageal reflux disease without esophagitis: Secondary | ICD-10-CM

## 2013-09-09 DIAGNOSIS — Z0000 Encounter for general adult medical examination without abnormal findings: Secondary | ICD-10-CM | POA: Diagnosis not present

## 2013-09-09 DIAGNOSIS — I2510 Atherosclerotic heart disease of native coronary artery without angina pectoris: Secondary | ICD-10-CM | POA: Diagnosis not present

## 2013-09-09 DIAGNOSIS — I82403 Acute embolism and thrombosis of unspecified deep veins of lower extremity, bilateral: Secondary | ICD-10-CM

## 2013-09-09 DIAGNOSIS — I129 Hypertensive chronic kidney disease with stage 1 through stage 4 chronic kidney disease, or unspecified chronic kidney disease: Secondary | ICD-10-CM

## 2013-09-09 DIAGNOSIS — M2550 Pain in unspecified joint: Secondary | ICD-10-CM

## 2013-09-09 DIAGNOSIS — Z1231 Encounter for screening mammogram for malignant neoplasm of breast: Secondary | ICD-10-CM | POA: Insufficient documentation

## 2013-09-09 MED ORDER — OXYCODONE-ACETAMINOPHEN 5-325 MG PO TABS: 1.0000 | ORAL_TABLET | Freq: Three times a day (TID) | ORAL | Status: DC | PRN

## 2013-09-09 NOTE — Progress Notes (Signed)
Subjective:    Patient ID: Vanessa Palmer, female    DOB: 08-Oct-1950, 63 y.o.   MRN: [id number]  CC: Routine follow up, CAD  HPI  Vanessa Palmer is a 63yo woman with PMH of CAD, CKD stage 3, HTN, HLD, OSA who presents for follow up.    Vanessa Palmer has successfully lost 24 pounds since April!  I congratulated her on that fact.  Despite the weight loss, she reports that her chronic knee and back pain are worse, particularly when the weather is very humid.  She is having to take her percocet more often and is running out at the end of the month.  She reports taking up to 5 per day some days.  She does have OA of the knees and reports back pain.  No injury.  She does have some shooting pain down her legs, but no weakness, B/B incontinence.  She has a walker, rolling scooter and 4 point cane.   She has not had a recent fall, but does have mechanical falls with "tumbling" when her knees lock up.   Otherwise she is doing well.  She has CAD for which she is treated with metoprolol, losartan, atorvastatin.  She has had no chest pain, SOB.  She does have some LE edema which is chronic and improved with lasix dose.  She has a history of bilateral DVT and is on xarelto.    She had one episode of nausea and vomiting with taking her pills this AM.  She was instructed to take most of her pills with food, except prilosec.    She is due for a mammogram and we have discussed scheduling this.   We reviewed her medications  Current Outpatient Prescriptions on File Prior to Visit  Medication Sig Dispense Refill  . acetaminophen (TYLENOL) 500 MG tablet Take 1,000 mg by mouth every 6 (six) hours as needed for moderate pain.      Marland Kitchen amLODipine (NORVASC) 10 MG tablet TAKE 1 TABLET (10 MG TOTAL) BY MOUTH DAILY.  30 tablet  3  . atorvastatin (LIPITOR) 40 MG tablet Take 1 tablet (40 mg total) by mouth daily.  30 tablet  11  . buPROPion (WELLBUTRIN XL) 150 MG 24 hr tablet Take 1 tablet (150 mg total) by mouth every  morning.  90 tablet  3  . chlorthalidone (HYGROTON) 25 MG tablet Take 25 mg by mouth daily.      . cyclobenzaprine (FLEXERIL) 10 MG tablet Take 0.5 tablets (5 mg total) by mouth 2 (two) times daily as needed for muscle spasms.  20 tablet  0  . fluticasone (FLONASE) 50 MCG/ACT nasal spray INSTILL TWO   SPRAYS IN EACH NOSTRIL ONCE DAILY  16 g  2  . fluticasone (FLOVENT HFA) 44 MCG/ACT inhaler Inhale 2 puffs into the lungs 2 (two) times daily.  1 Inhaler  12  . losartan (COZAAR) 100 MG tablet TAKE 1 TABLET (100 MG TOTAL) BY MOUTH TWO   TIMES DAILY.  60 tablet  3  . metoprolol (LOPRESSOR) 50 MG tablet Take 1 tablet (50 mg total) by mouth daily.  90 tablet  1  . nitroGLYCERIN (NITROSTAT) 0.4 MG SL tablet Place 0.4 mg under the tongue every 5 (five) minutes as needed for chest pain.      . Nutritional Supplements (COLD AND FLU PO) Take 30 mLs by mouth daily as needed (for cold).      Marland Kitchen omeprazole (PRILOSEC) 40 MG capsule Take 1 capsule (40 mg  total) by mouth daily.  90 capsule  3  . TOVIAZ 8 MG TB24 Take 8 mg by mouth Daily.      . traZODone (DESYREL) 50 MG tablet Take 0.5 tablets (25 mg total) by mouth at bedtime.  45 tablet  3  . XARELTO 20 MG TABS tablet TAKE 1 TABLET (20 MG TOTAL) BY MOUTH DAILY WITH SUPPER.  60 tablet  1   No current facility-administered medications on file prior to visit.     Review of Systems  Constitutional: Negative for fever, chills, fatigue and unexpected weight change.  HENT: Negative for ear discharge and ear pain.   Eyes: Negative for photophobia and visual disturbance.       Wears glasses  Respiratory: Negative for cough and shortness of breath.   Cardiovascular: Positive for leg swelling (mild edema). Negative for chest pain.  Gastrointestinal: Negative for abdominal pain, diarrhea, constipation and blood in stool.  Genitourinary: Negative for dysuria and difficulty urinating.       Has urinary incontinence, treated with Toviaz  Musculoskeletal: Positive for  arthralgias and back pain. Negative for joint swelling and neck pain.  Skin: Positive for pallor (has areas of pallor on wrist and ankles, unchanged). Negative for rash and wound.  Neurological: Negative for dizziness, weakness, light-headedness and headaches.  Psychiatric/Behavioral: Negative for confusion and decreased concentration.       Objective:   Physical Exam  Constitutional: She is oriented to person, place, and time. She appears well-developed and well-nourished.  Obese woman, NAD  HENT:  Head: Normocephalic and atraumatic.  Mouth/Throat: No oropharyngeal exudate.  Eyes: Conjunctivae are normal. Pupils are equal, round, and reactive to light. No scleral icterus.  Neck: Neck supple. No thyromegaly present.  Cardiovascular: Normal rate, regular rhythm and normal heart sounds.  Exam reveals no friction rub.   Pulmonary/Chest: Effort normal and breath sounds normal. No respiratory distress. She has no wheezes.  Abdominal: Soft. Bowel sounds are normal. She exhibits no distension. There is no tenderness.  Musculoskeletal: She exhibits edema (trace to 1+ edema to mid calf bilaterally). She exhibits no tenderness.  Lymphadenopathy:    She has no cervical adenopathy.  Neurological: She is alert and oriented to person, place, and time.  Skin: Skin is warm and dry. No erythema.  Psychiatric: She has a normal mood and affect. Her behavior is normal.   No labs today     Assessment & Plan:  RTC in 4 months

## 2013-09-09 NOTE — Patient Instructions (Signed)
General Instructions: Please schedule a follow up visit within the next 4 months, sooner if needed.   For your medications:   Please bring all of your pill  Bottles with you to each visit.  This will help make sure that we have an up to date list of all the medications you are taking.  Please also bring any over the counter herbal medications you are taking (not including advil, tylenol, etc.)  Please continue taking all of your medications as prescribed.  You will have an increased number of your percocet, please let me know if this works for you for your knee and back pain.   Congratulations on the weight loss!  Keep up the good work.   Thank you!     Treatment Goals:  Goals (1 Years of Data) as of 09/09/13         As of Today 06/02/13 05/14/13 03/03/13 03/03/13     Blood Pressure    . Blood Pressure < 140/90  125/87 116/78 112/78 153/87 154/81      Progress Toward Treatment Goals:  Treatment Goal 09/09/2013  Blood pressure at goal  Prevent falls at goal    Self Care Goals & Plans:  Self Care Goal 09/09/2013  Manage my medications take my medicines as prescribed; bring my medications to every visit; refill my medications on time  Monitor my health -  Eat healthy foods eat more vegetables; eat foods that are low in salt; eat baked foods instead of fried foods; eat smaller portions; eat fruit for snacks and desserts  Be physically active find an activity I enjoy  Prevent falls wear appropriate shoes  Meeting treatment goals -    No flowsheet data found.   Care Management & Community Referrals:  Referral 06/02/2013  Referrals made for care management support none needed  Referrals made to community resources none     Fall Prevention and Home Safety Falls cause injuries and can affect all age groups. It is possible to use preventive measures to significantly decrease the likelihood of falls. There are many simple measures which can make your home safer and prevent  falls.  OUTDOORS  Repair cracks and edges of walkways and driveways.  Remove high doorway thresholds.  Trim shrubbery on the main path into your home.  Have good outside lighting.  Clear walkways of tools, rocks, debris, and clutter.  Check that handrails are not broken and are securely fastened. Both sides of steps should have handrails.  Have leaves, snow, and ice cleared regularly.  Use sand or salt on walkways during winter months.  In the garage, clean up grease or oil spills.  BATHROOM  Install night lights.  Install grab bars by the toilet and in the tub and shower.  Use non-skid mats or decals in the tub or shower.  Place a plastic non-slip stool in the shower to sit on, if needed.  Keep floors dry and clean up all water on the floor immediately.  Remove soap buildup in the tub or shower on a regular basis.  Secure bath mats with non-slip, double-sided rug tape.  Remove throw rugs and tripping hazards from the floors.  BEDROOMS  Install night lights.  Make sure a bedside light is easy to reach.  Do not use oversized bedding.  Keep a telephone by your bedside.  Have a firm chair with side arms to use for getting dressed.  Remove throw rugs and tripping hazards from the floor.  KITCHEN  Keep handles  on pots and pans turned toward the center of the stove. Use back burners when possible.  Clean up spills quickly and allow time for drying.  Avoid walking on wet floors.  Avoid hot utensils and knives.  Position shelves so they are not too high or low.  Place commonly used objects within easy reach.  If necessary, use a sturdy step stool with a grab bar when reaching.  Keep electrical cables out of the way.  Do not use floor polish or wax that makes floors slippery. If you must use wax, use non-skid floor wax.  Remove throw rugs and tripping hazards from the floor.  STAIRWAYS  Never leave objects on stairs.  Place handrails on both  sides of stairways and use them. Fix any loose handrails. Make sure handrails on both sides of the stairways are as long as the stairs.  Check carpeting to make sure it is firmly attached along stairs. Make repairs to worn or loose carpet promptly.  Avoid placing throw rugs at the top or bottom of stairways, or properly secure the rug with carpet tape to prevent slippage. Get rid of throw rugs, if possible.  Have an electrician put in a light switch at the top and bottom of the stairs.  OTHER FALL PREVENTION TIPS  Wear low-heel or rubber-soled shoes that are supportive and fit well. Wear closed toe shoes.  When using a stepladder, make sure it is fully opened and both spreaders are firmly locked. Do not climb a closed stepladder.  Add color or contrast paint or tape to grab bars and handrails in your home. Place contrasting color strips on first and last steps.  Learn and use mobility aids as needed. Install an electrical emergency response system.  Turn on lights to avoid dark areas. Replace light bulbs that burn out immediately. Get light switches that glow.  Arrange furniture to create clear pathways. Keep furniture in the same place.  Firmly attach carpet with non-skid or double-sided tape.  Eliminate uneven floor surfaces.  Select a carpet pattern that does not visually hide the edge of steps.  Be aware of all pets.  OTHER HOME SAFETY TIPS  Set the water temperature for 120 F (48.8 C).  Keep emergency numbers on or near the telephone.  Keep smoke detectors on every level of the home and near sleeping areas. Document Released: 02/02/2002 Document Revised: 08/14/2011 Document Reviewed: 05/04/2011 [facility] Patient Information 2015 [place], [place]. This information is not intended to replace advice given to you by your health care provider. Make sure you discuss any questions you have with your health care provider.

## 2013-09-09 NOTE — Assessment & Plan Note (Signed)
BP Readings from Last 3 Encounters:  09/09/13 125/87  06/02/13 116/78  05/14/13 112/78    Lab Results  Component Value Date   NA 135 06/02/2013   K 4.5 06/02/2013   CREATININE 1.84* 06/02/2013    Assessment: Blood pressure control: controlled Progress toward BP goal:  at goal Comments: Renal function relatively stable  Plan: Medications:  continue current medications, amlodipine, losartan, lasix, metoprolol, chlorthalidone Educational resources provided:   Self management tools provided:   Other plans: She may not need 2 diuretics, will consider stopping lasix at next visit. Chlorthalidone added by Renal

## 2013-09-09 NOTE — Assessment & Plan Note (Signed)
Well controlled on Toviaz.

## 2013-09-09 NOTE — Assessment & Plan Note (Signed)
On Xarelto without issue.  She reports not missing doses.

## 2013-09-09 NOTE — Assessment & Plan Note (Signed)
Stable, no acute issues.  She is on statin, beta blocker, ARB.

## 2013-09-09 NOTE — Assessment & Plan Note (Signed)
Well controlled on prilosec, continue

## 2013-09-09 NOTE — Assessment & Plan Note (Signed)
Requesting increased # of percocet.  Will increase to 150 pills a month and re-evaluate next time I see her.

## 2013-09-09 NOTE — Assessment & Plan Note (Addendum)
[facility]  Last note we have is from January where they note that they do not expect her to progress to HD.  Her most recent BUN/Cr are 22/1.84 which is relatively stable.  Continue to monitor.  She has no change in urinary habits or decreased urine volume per report.

## 2013-09-09 NOTE — Assessment & Plan Note (Signed)
MMG: Due, scheduled today PAP: s/p hysterectomy - 11/24/2008 - Pap negative, also noted 2005 negative Flu - refused  Colonoscopy - Reports having in 2012, will need to find report.  Tetanus - 09/19/10  Zostavax - discuss at next visit.  Dexa - at 63yo Pneumonia - at 1165

## 2013-09-09 NOTE — Assessment & Plan Note (Signed)
Has not used CPAP for years.  Is on trazodone for sleep.

## 2013-09-09 NOTE — Assessment & Plan Note (Signed)
LDL last year was 103, she is on atorvastatin.  Continue this medication.  Consider recheck cholesterol at next visit.

## 2013-09-09 NOTE — Assessment & Plan Note (Signed)
>>  ASSESSMENT AND PLAN FOR CORONARY ATHEROSCLEROSIS WRITTEN ON 09/09/2013  9:28 AM BY Rudolph Daoust B, MD  Stable, no acute issues.  She is on statin, beta blocker, ARB.

## 2013-09-25 ENCOUNTER — Ambulatory Visit (HOSPITAL_COMMUNITY): Payer: Medicare Other

## 2013-09-28 ENCOUNTER — Ambulatory Visit (HOSPITAL_COMMUNITY)
Admission: RE | Admit: 2013-09-28 | Discharge: 2013-09-28 | Disposition: A | Discharge status: Home / Self Care | Payer: Medicare Other | Source: Ambulatory Visit | Attending: Internal Medicine | Admitting: Internal Medicine

## 2013-09-28 DIAGNOSIS — Z1231 Encounter for screening mammogram for malignant neoplasm of breast: Secondary | ICD-10-CM | POA: Insufficient documentation

## 2013-10-02 DIAGNOSIS — I10 Essential (primary) hypertension: Secondary | ICD-10-CM | POA: Diagnosis not present

## 2013-10-02 DIAGNOSIS — R609 Edema, unspecified: Secondary | ICD-10-CM | POA: Diagnosis not present

## 2013-10-02 DIAGNOSIS — N183 Chronic kidney disease, stage 3 unspecified: Secondary | ICD-10-CM | POA: Diagnosis not present

## 2013-10-08 ENCOUNTER — Other Ambulatory Visit: Payer: Self-pay | Admitting: *Deleted

## 2013-10-08 DIAGNOSIS — M170 Bilateral primary osteoarthritis of knee: Secondary | ICD-10-CM

## 2013-10-08 MED ORDER — OXYCODONE-ACETAMINOPHEN 5-325 MG PO TABS: 1.0000 | ORAL_TABLET | Freq: Three times a day (TID) | ORAL | Status: DC | PRN

## 2013-10-08 NOTE — Telephone Encounter (Signed)
Pt.notified

## 2013-10-08 NOTE — Telephone Encounter (Signed)
Will not provide additional paper scripts bc quantity was just increased from 120 to 150 and was going to assess response at next appt. Contract is for 120. Will provide one month at 150 and Dr Criselda PeachesMullen can reassess when she returns.  Pls make Nov appt with Dr Criselda PeachesMullen

## 2013-10-19 ENCOUNTER — Other Ambulatory Visit: Payer: Self-pay | Admitting: Internal Medicine

## 2013-10-27 ENCOUNTER — Other Ambulatory Visit: Payer: Self-pay | Admitting: Oncology

## 2013-11-03 ENCOUNTER — Other Ambulatory Visit: Payer: Self-pay | Admitting: Oncology

## 2013-11-09 ENCOUNTER — Other Ambulatory Visit: Payer: Self-pay | Admitting: *Deleted

## 2013-11-09 DIAGNOSIS — M170 Bilateral primary osteoarthritis of knee: Secondary | ICD-10-CM

## 2013-11-09 MED ORDER — OXYCODONE-ACETAMINOPHEN 5-325 MG PO TABS: 1.0000 | ORAL_TABLET | Freq: Three times a day (TID) | ORAL | Status: DC | PRN

## 2013-11-09 NOTE — Telephone Encounter (Signed)
Pt aware Rx is ready. 

## 2013-12-01 ENCOUNTER — Other Ambulatory Visit: Payer: Self-pay | Admitting: Internal Medicine

## 2013-12-01 ENCOUNTER — Other Ambulatory Visit: Payer: Self-pay | Admitting: Oncology

## 2013-12-08 ENCOUNTER — Other Ambulatory Visit: Payer: Self-pay | Admitting: *Deleted

## 2013-12-08 DIAGNOSIS — M170 Bilateral primary osteoarthritis of knee: Secondary | ICD-10-CM

## 2013-12-08 NOTE — Telephone Encounter (Signed)
Vanessa Palmer will pick up.

## 2013-12-09 MED ORDER — OXYCODONE-ACETAMINOPHEN 5-325 MG PO TABS: 1.0000 | ORAL_TABLET | Freq: Three times a day (TID) | ORAL | Status: DC | PRN

## 2013-12-22 ENCOUNTER — Other Ambulatory Visit (HOSPITAL_COMMUNITY): Payer: Self-pay | Admitting: Internal Medicine

## 2014-01-06 ENCOUNTER — Ambulatory Visit (INDEPENDENT_AMBULATORY_CARE_PROVIDER_SITE_OTHER): Payer: Medicare Other | Admitting: Internal Medicine

## 2014-01-06 VITALS — BP 117/66 | HR 54 | Temp 97.9°F | Ht 62.0 in | Wt 260.5 lb

## 2014-01-06 DIAGNOSIS — R7309 Other abnormal glucose: Secondary | ICD-10-CM

## 2014-01-06 DIAGNOSIS — K219 Gastro-esophageal reflux disease without esophagitis: Secondary | ICD-10-CM

## 2014-01-06 DIAGNOSIS — R252 Cramp and spasm: Secondary | ICD-10-CM

## 2014-01-06 DIAGNOSIS — G4762 Sleep related leg cramps: Secondary | ICD-10-CM | POA: Insufficient documentation

## 2014-01-06 DIAGNOSIS — E785 Hyperlipidemia, unspecified: Secondary | ICD-10-CM

## 2014-01-06 DIAGNOSIS — I129 Hypertensive chronic kidney disease with stage 1 through stage 4 chronic kidney disease, or unspecified chronic kidney disease: Secondary | ICD-10-CM

## 2014-01-06 DIAGNOSIS — N183 Chronic kidney disease, stage 3 unspecified: Secondary | ICD-10-CM

## 2014-01-06 DIAGNOSIS — I82403 Acute embolism and thrombosis of unspecified deep veins of lower extremity, bilateral: Secondary | ICD-10-CM

## 2014-01-06 DIAGNOSIS — R748 Abnormal levels of other serum enzymes: Secondary | ICD-10-CM

## 2014-01-06 DIAGNOSIS — Z0000 Encounter for general adult medical examination without abnormal findings: Secondary | ICD-10-CM

## 2014-01-06 DIAGNOSIS — M170 Bilateral primary osteoarthritis of knee: Secondary | ICD-10-CM

## 2014-01-06 DIAGNOSIS — R7303 Prediabetes: Secondary | ICD-10-CM

## 2014-01-06 DIAGNOSIS — M25561 Pain in right knee: Secondary | ICD-10-CM

## 2014-01-06 DIAGNOSIS — M2550 Pain in unspecified joint: Secondary | ICD-10-CM

## 2014-01-06 DIAGNOSIS — Z713 Dietary counseling and surveillance: Secondary | ICD-10-CM

## 2014-01-06 DIAGNOSIS — I2510 Atherosclerotic heart disease of native coronary artery without angina pectoris: Secondary | ICD-10-CM

## 2014-01-06 DIAGNOSIS — J4520 Mild intermittent asthma, uncomplicated: Secondary | ICD-10-CM

## 2014-01-06 DIAGNOSIS — I10 Essential (primary) hypertension: Secondary | ICD-10-CM

## 2014-01-06 DIAGNOSIS — M25562 Pain in left knee: Secondary | ICD-10-CM

## 2014-01-06 LAB — BASIC METABOLIC PANEL WITH GFR
BUN: 16 mg/dL (ref 6–23)
CALCIUM: 8.7 mg/dL (ref 8.4–10.5)
CO2: 23 mEq/L (ref 19–32)
Chloride: 105 mEq/L (ref 96–112)
Creat: 1.66 mg/dL — ABNORMAL HIGH (ref 0.50–1.10)
GFR, Est African American: 38 mL/min — ABNORMAL LOW
GFR, Est Non African American: 33 mL/min — ABNORMAL LOW
GLUCOSE: 116 mg/dL — AB (ref 70–99)
Potassium: 4.4 mEq/L (ref 3.5–5.3)
SODIUM: 139 meq/L (ref 135–145)

## 2014-01-06 LAB — HEPATIC FUNCTION PANEL
ALBUMIN: 3.7 g/dL (ref 3.5–5.2)
ALT: 10 U/L (ref 0–35)
AST: 16 U/L (ref 0–37)
Alkaline Phosphatase: 145 U/L — ABNORMAL HIGH (ref 39–117)
BILIRUBIN DIRECT: 0.2 mg/dL (ref 0.0–0.3)
BILIRUBIN TOTAL: 0.7 mg/dL (ref 0.2–1.2)
Indirect Bilirubin: 0.5 mg/dL (ref 0.2–1.2)
Total Protein: 7.4 g/dL (ref 6.0–8.3)

## 2014-01-06 LAB — GLUCOSE, CAPILLARY: Glucose-Capillary: 112 mg/dL — ABNORMAL HIGH (ref 70–99)

## 2014-01-06 LAB — POCT GLYCOSYLATED HEMOGLOBIN (HGB A1C): HEMOGLOBIN A1C: 5.7

## 2014-01-06 MED ORDER — B COMPLEX PO TABS: 1.0000 | ORAL_TABLET | Freq: Every day | ORAL | Status: DC

## 2014-01-06 NOTE — Assessment & Plan Note (Signed)
Seems to be most related to triggers, which she tries to avoid.  Has not needed flovent in a while.  She has not had PFTs.  Would consider doing them for baseline, and will discuss further at next visit.

## 2014-01-06 NOTE — Assessment & Plan Note (Signed)
Check A1C today, last was not diagnostic for DM.

## 2014-01-06 NOTE — Assessment & Plan Note (Signed)
>>  ASSESSMENT AND PLAN FOR CORONARY ATHEROSCLEROSIS WRITTEN ON 01/06/2014  2:54 PM BY Tanessa Tidd B, MD  No pain, has not needed nitroglycerin  for a long time.

## 2014-01-06 NOTE — Assessment & Plan Note (Signed)
BP Readings from Last 3 Encounters:  01/06/14 117/66  09/09/13 125/87  06/02/13 116/78    Lab Results  Component Value Date   NA 135 06/02/2013   K 4.5 06/02/2013   CREATININE 1.84* 06/02/2013    Assessment: Blood pressure control: controlled Progress toward BP goal:  at goal Comments: Doing well on medications, no complaints  Plan: Medications:  continue current medications, chlorthalidone, amlodipine, losartan, metoprolol Educational resources provided:   Self management tools provided:   Other plans: Check CMET today.

## 2014-01-06 NOTE — Assessment & Plan Note (Signed)
MMG: 09/28/13 - negative PAP: s/p hysterectomy - 11/24/2008 - Pap negative, also noted 2005 negative Flu - refused  Colonoscopy - Reports having done 3 years ago, will attempt to obtain records Tetanus - 09/19/10 Pneumovax - due to her asthma, this is recommended despite her age.  We discussed but she refused.  I provided her with information on the vaccine and we will discuss again at next visit.    Dexa - at 63yo

## 2014-01-06 NOTE — Assessment & Plan Note (Signed)
No pain, has not needed nitroglycerin for a long time.

## 2014-01-06 NOTE — Assessment & Plan Note (Signed)
Appears she has been adequately treated, 3-6 months recommended at time of diagnosis.  However, upon review, indefinite therapy would be preferred for patients with proximal DVT, and she had a bilateral proximal DVT.  Will recommend patient stays on xarelto indefinitely as long as clotting risk outweighs bleeding risk.  HAS BLED score of 0.   Consider checking for hypercoagulable state at next visit, knowing that some of the assays may be affected by xarelto.  However, if she has a heritable disorder, this could change treatment for her family.

## 2014-01-06 NOTE — Assessment & Plan Note (Signed)
She is doing very well with her weight loss plan.  I congratulated her on her success.  She is down to 260 pounds.

## 2014-01-06 NOTE — Patient Instructions (Signed)
General Instructions: Please schedule a follow up visit within the next 4-6  Months, earlier if needed.   For your medications:   Please bring all of your pill  Bottles with you to each visit.  This will help make sure that we have an up to date list of all the medications you are taking.  Please also bring any over the counter herbal medications you are taking (not including advil, tylenol, etc.)  Please continue taking all of your medication as prescribed.   For your leg cramping, there is information below.  The first thing to try would be stretches prior to bed (picture and instructions in this information.)  You can also try Vitamin B complex over the counter.  You can ask your pharmacist about this.  I will also send a prescription to the pharmacist so they can see if you can get the medication prescription strength.  (Rena-vite once daily)  Your pain medication prescription should be ready by Friday.   Because of your asthma, it is recommended that you have the pneumonia vaccine, more information below for you to review.    Thank you!   Treatment Goals:  Goals (1 Years of Data) as of 01/06/14          As of Today 09/09/13 06/02/13 05/14/13 03/03/13     Blood Pressure   . Blood Pressure < 140/90  117/66 125/87 116/78 112/78 153/87      Progress Toward Treatment Goals:  Treatment Goal 01/06/2014  Blood pressure at goal  Prevent falls at goal    Self Care Goals & Plans:  Self Care Goal 01/06/2014  Manage my medications take my medicines as prescribed; bring my medications to every visit; refill my medications on time  Monitor my health -  Eat healthy foods eat more vegetables; eat foods that are low in salt; eat baked foods instead of fried foods  Be physically active -  Prevent falls wear appropriate shoes  Meeting treatment goals -    No flowsheet data found.   Care Management & Community Referrals:  Referral 06/02/2013  Referrals made for care management support  none needed  Referrals made to community resources none     Leg Cramps Leg cramps that occur during exercise can be caused by poor circulation or dehydration. However, muscle cramps that occur at rest or during the night are usually not due to any serious medical problem. Heat cramps may cause muscle spasms during hot weather.  CAUSES There is no clear cause for muscle cramps. However, dehydration may be a factor for those who do not drink enough fluids and those who exercise in the heat. Imbalances in the level of sodium, potassium, calcium or magnesium in the muscle tissue may also be a factor. Some medications, such as water pills (diuretics), may cause loss of chemicals that the body needs (like sodium and potassium) and cause muscle cramps. TREATMENT   Make sure your diet has enough fluids and essential minerals for the muscle to work normally.  Avoid strenuous exercise for several days if you have been having frequent leg cramps.  Stretch and massage the cramped muscle for several minutes.  Some medicines may be helpful in some patients with night cramps. Only take over-the-counter or prescription medicines as directed by your caregiver. SEEK IMMEDIATE MEDICAL CARE IF:   Your leg cramps become worse.  Your foot becomes cold, numb, or blue. Document Released: 03/22/2004 Document Revised: 05/07/2011 Document Reviewed: 03/09/2008 Exit[employer] Patient Information 2015 [employer]. This information is not intended to replace advice given to you by your health care provider. Make sure you discuss any questions you have with your health care provider.  Pneumococcal Vaccine, Polyvalent solution for injection What is this medicine? PNEUMOCOCCAL VACCINE, POLYVALENT (NEU mo KOK al vak SEEN, pol ee VEY luhnt) is a vaccine to prevent pneumococcus bacteria infection. These bacteria are a major cause of ear infections, Strep throat infections, and serious pneumonia, meningitis, or blood infections  worldwide. These vaccines help the body to produce antibodies (protective substances) that help your body defend against these bacteria. This vaccine is recommended for people 332 years of age and older with health problems. It is also recommended for all adults over 61 years old. This vaccine will not treat an infection. This medicine may be used for other purposes; ask your health care provider or pharmacist if you have questions. COMMON BRAND NAME(S): Pneumovax 23 What should I tell my health care provider before I take this medicine? They need to know if you have any of these conditions: -bleeding problems -bone marrow or organ transplant -cancer, Hodgkin's disease -fever -infection -immune system problems -low platelet count in the blood -seizures -an unusual or allergic reaction to pneumococcal vaccine, diphtheria toxoid, other vaccines, latex, other medicines, foods, dyes, or preservatives -pregnant or trying to get pregnant -breast-feeding How should I use this medicine? This vaccine is for injection into a muscle or under the skin. It is given by a health care professional. A copy of Vaccine Information Statements will be given before each vaccination. Read this sheet carefully each time. The sheet may change frequently. Talk to your pediatrician regarding the use of this medicine in children. While this drug may be prescribed for children as young as 292 years of age for selected conditions, precautions do apply. Overdosage: If you think you have taken too much of this medicine contact a poison control center or emergency room at once. NOTE: This medicine is only for you. Do not share this medicine with others. What if I miss a dose? It is important not to miss your dose. Call your doctor or health care professional if you are unable to keep an appointment. What may interact with this medicine? -medicines for cancer chemotherapy -medicines that suppress your immune  function -medicines that treat or prevent blood clots like warfarin, enoxaparin, and dalteparin -steroid medicines like prednisone or cortisone This list may not describe all possible interactions. Give your health care provider a list of all the medicines, herbs, non-prescription drugs, or dietary supplements you use. Also tell them if you smoke, drink alcohol, or use illegal drugs. Some items may interact with your medicine. What should I watch for while using this medicine? Mild fever and pain should go away in 3 days or less. Report any unusual symptoms to your doctor or health care professional. What side effects may I notice from receiving this medicine? Side effects that you should report to your doctor or health care professional as soon as possible: -allergic reactions like skin rash, itching or hives, swelling of the face, lips, or tongue -breathing problems -confused -fever over 102 degrees F -pain, tingling, numbness in the hands or feet -seizures -unusual bleeding or bruising -unusual muscle weakness Side effects that usually do not require medical attention (report to your doctor or health care professional if they continue or are bothersome): -aches and pains -diarrhea -fever of 102 degrees F or less -headache -irritable -loss of appetite -pain, tender at site where  injected -trouble sleeping This list may not describe all possible side effects. Call your doctor for medical advice about side effects. You may report side effects to FDA at 1-800-FDA-1088. Where should I keep my medicine? This does not apply. This vaccine is given in a clinic, pharmacy, doctor's office, or other health care setting and will not be stored at home. NOTE: This sheet is a summary. It may not cover all possible information. If you have questions about this medicine, talk to your doctor, pharmacist, or health care provider.  2015, [employer]. (2007-09-19 14:32:37)

## 2014-01-06 NOTE — Assessment & Plan Note (Signed)
Taking prilosec with good results.

## 2014-01-06 NOTE — Progress Notes (Signed)
Subjective:    Patient ID: Vanessa Palmer, female    DOB: 26-Oct-1950, 63 y.o.   MRN: [id number]  CC: Routine follow up  HPI  [name] is a 63yo woman with PMH of Urinary incontinence, GERD, HLD, OSA, HTN, CAD, DJD, CKD 3, asthma, DVT, pre-diabetes.   Asthma, acts up at certain times of year and due to certain smells.  She avoids triggers.  Doing well now.    She reports her arthritis acts up worse in the winter and in the bad weather.  She has morning stiffness that is < 30 minutes.  Her knees are the worst and they sometimes pop and cause her to fall.  She is requesting Percocet 10mg .  When she hurts she stands still and holds on to the wall.  She is also having cramps in her legs at night.  She takes her muscle relaxer for it which sometimes helps.  Starts in the top of her foot with tightening and moves up to her legs, results in a charley horse in her calf which she has to rub to improve.  The muscle relaxer helped and she also took vinegar and it helped.    She has lost weight, which she is very proud of.  She is trying to exercise which is exacerbating her knee pain.    Has not needed nitroglycerin in "a long time."  Off aspirin due to kidney disease per patient.   DVT last year, still on xarelto.   She had an extensive clot burden seen on 03/2012 with DVT in bilateral LE.  This appears to be her first clot. On review of discharge summary from that time, there was not noted inciting factor except sedentary lifestyle and obesity.  They reported that she will likely need 3-6 months of treatment, she has now been treated for 20 months and can likely stop.  Will advise Ms. Holsomback to stop.    She did not bring her medications in today.  She is not smoking.   Review of Systems  Constitutional: Negative for fever, chills and unexpected weight change.  HENT: Negative for ear discharge and ear pain.   Eyes: Negative for photophobia and visual disturbance.  Respiratory: Negative for  cough, shortness of breath and wheezing.   Cardiovascular: Negative for chest pain, palpitations and leg swelling.  Gastrointestinal: Negative for nausea, abdominal pain and diarrhea.  Genitourinary: Negative for dysuria and difficulty urinating.  Musculoskeletal: Positive for back pain (chronic back pain), arthralgias (chronic knee pain) and gait problem (uses cane, walker). Negative for joint swelling.  Skin: Negative for rash and wound.  Neurological: Negative for dizziness, weakness and light-headedness.       Objective:   Physical Exam  Constitutional: She is oriented to person, place, and time. She appears well-developed and well-nourished. No distress.  HENT:  Head: Normocephalic and atraumatic.  Eyes: Pupils are equal, round, and reactive to light. No scleral icterus.  Cardiovascular: Normal rate and regular rhythm.   No murmur heard. Pulmonary/Chest: Breath sounds normal. No respiratory distress. She has no wheezes.  Abdominal: Soft. Bowel sounds are normal. She exhibits no distension.  Musculoskeletal: She exhibits no tenderness.  Lymphadenopathy:    She has no cervical adenopathy.  Neurological: She is alert and oriented to person, place, and time.  Skin: Skin is warm and dry. No erythema.  Psychiatric: She has a normal mood and affect. Her behavior is normal.  Vitals reviewed.  Will plan to check CMET, A1C, FLP and  UDS today     Assessment & Plan:  RTC in 4-6 months.

## 2014-01-06 NOTE — Assessment & Plan Note (Signed)
She has what sounds like charley horses regularly.  I provided her with stretching exercises to do before bed and advised she start taking B complex vitamins.  Will reassess at next visit.

## 2014-01-06 NOTE — Assessment & Plan Note (Signed)
Last check normal, recheck today.

## 2014-01-06 NOTE — Assessment & Plan Note (Signed)
[facility]  Last note reviewed.  CMET today.

## 2014-01-06 NOTE — Assessment & Plan Note (Signed)
She is requesting an increase in her pain medication for knee pain.  Will check UDS today.  If appropriate will increase medication to Percocet 10/325 to decrease her tylenol dosage daily.  If this medication does not adequately control her pain, I advised her that she will need to be referred to a pain management clinic.  She is not interested in PT at this time.  Another option would be to repeat knee imaging and possibly refer to orthopedic surgery if her osteoarthritis has progressed to the point of needing surgery.  She notes that she would refuse any injectable medication b/c she is afraid of needles.

## 2014-01-06 NOTE — Assessment & Plan Note (Signed)
On atorvastatin, last check LDL was 103.  FLP today.

## 2014-01-07 LAB — PRESCRIPTION ABUSE MONITORING 15P, URINE
Amphetamine/Meth: NEGATIVE ng/mL
Barbiturate Screen, Urine: NEGATIVE ng/mL
Benzodiazepine Screen, Urine: NEGATIVE ng/mL
Buprenorphine, Urine: NEGATIVE ng/mL
CANNABINOID SCRN UR: NEGATIVE ng/mL
CARISOPRODOL, URINE: NEGATIVE ng/mL
Cocaine Metabolites: NEGATIVE ng/mL
Creatinine, Urine: 241.06 mg/dL (ref >20.0)
Fentanyl, Ur: NEGATIVE ng/mL
METHADONE SCREEN, URINE: NEGATIVE ng/mL
Meperidine, Ur: NEGATIVE ng/mL
Propoxyphene: NEGATIVE ng/mL
Tramadol Scrn, Ur: NEGATIVE ng/mL
ZOLPIDEM, URINE: NEGATIVE ng/mL

## 2014-01-07 MED ORDER — OXYCODONE-ACETAMINOPHEN 10-325 MG PO TABS: 1.0000 | ORAL_TABLET | Freq: Three times a day (TID) | ORAL | Status: DC | PRN

## 2014-01-07 NOTE — Addendum Note (Signed)
Addended by: Debe CoderMULLEN, [name] on: 01/07/2014 10:00 AM   Modules accepted: Orders

## 2014-01-09 LAB — OPIATES/OPIOIDS (LC/MS-MS)
Codeine Urine: NEGATIVE ng/mL (ref <50)
HYDROCODONE: NEGATIVE ng/mL (ref <50)
Hydromorphone: NEGATIVE ng/mL (ref <50)
Morphine Urine: NEGATIVE ng/mL (ref <50)
Norhydrocodone, Ur: NEGATIVE ng/mL (ref <50)
Noroxycodone, Ur: 2398 ng/mL — ABNORMAL HIGH (ref <50)
OXYMORPHONE, URINE: 95 ng/mL — AB (ref <50)
Oxycodone, ur: 1328 ng/mL — ABNORMAL HIGH (ref <50)

## 2014-01-09 LAB — OXYCODONE, URINE (LC/MS-MS)
NOROXYCODONE, UR: 2398 ng/mL — AB (ref <50)
OXYMORPHONE, URINE: 95 ng/mL — AB (ref <50)
Oxycodone, ur: 1328 ng/mL — ABNORMAL HIGH (ref <50)

## 2014-02-04 ENCOUNTER — Other Ambulatory Visit: Payer: Self-pay | Admitting: *Deleted

## 2014-02-04 DIAGNOSIS — M2550 Pain in unspecified joint: Secondary | ICD-10-CM

## 2014-02-04 MED ORDER — OXYCODONE-ACETAMINOPHEN 10-325 MG PO TABS: 1.0000 | ORAL_TABLET | Freq: Three times a day (TID) | ORAL | Status: DC | PRN

## 2014-02-04 NOTE — Telephone Encounter (Signed)
Message left on pt's recorder rx ready for pickupGoldston, Vanessa Lachapelle Cassady12/10/20153:13 PM

## 2014-02-04 NOTE — Telephone Encounter (Signed)
Call from pt stating refill due on Sun 12/13- requests to pick up rx before the weekend.  Will send to pcp for review, please advise.Criss AlvineGoldston, Darlene Cassady12/10/201510:08 AM

## 2014-02-25 ENCOUNTER — Other Ambulatory Visit: Payer: Self-pay | Admitting: Internal Medicine

## 2014-03-02 DIAGNOSIS — F10229 Alcohol dependence with intoxication, unspecified: Secondary | ICD-10-CM | POA: Diagnosis not present

## 2014-03-02 DIAGNOSIS — Z5181 Encounter for therapeutic drug level monitoring: Secondary | ICD-10-CM | POA: Diagnosis not present

## 2014-03-02 DIAGNOSIS — F333 Major depressive disorder, recurrent, severe with psychotic symptoms: Secondary | ICD-10-CM | POA: Diagnosis not present

## 2014-03-02 DIAGNOSIS — Z79899 Other long term (current) drug therapy: Secondary | ICD-10-CM | POA: Diagnosis not present

## 2014-03-08 ENCOUNTER — Other Ambulatory Visit: Payer: Self-pay | Admitting: *Deleted

## 2014-03-08 DIAGNOSIS — M2550 Pain in unspecified joint: Secondary | ICD-10-CM

## 2014-03-11 DIAGNOSIS — F333 Major depressive disorder, recurrent, severe with psychotic symptoms: Secondary | ICD-10-CM | POA: Diagnosis not present

## 2014-03-11 DIAGNOSIS — F10229 Alcohol dependence with intoxication, unspecified: Secondary | ICD-10-CM | POA: Diagnosis not present

## 2014-03-11 DIAGNOSIS — Z79899 Other long term (current) drug therapy: Secondary | ICD-10-CM | POA: Diagnosis not present

## 2014-03-11 DIAGNOSIS — Z5181 Encounter for therapeutic drug level monitoring: Secondary | ICD-10-CM | POA: Diagnosis not present

## 2014-03-11 MED ORDER — OXYCODONE-ACETAMINOPHEN 10-325 MG PO TABS: 1.0000 | ORAL_TABLET | Freq: Three times a day (TID) | ORAL | Status: DC | PRN

## 2014-03-18 DIAGNOSIS — F10229 Alcohol dependence with intoxication, unspecified: Secondary | ICD-10-CM | POA: Diagnosis not present

## 2014-03-18 DIAGNOSIS — Z79899 Other long term (current) drug therapy: Secondary | ICD-10-CM | POA: Diagnosis not present

## 2014-03-18 DIAGNOSIS — F333 Major depressive disorder, recurrent, severe with psychotic symptoms: Secondary | ICD-10-CM | POA: Diagnosis not present

## 2014-03-18 DIAGNOSIS — Z5181 Encounter for therapeutic drug level monitoring: Secondary | ICD-10-CM | POA: Diagnosis not present

## 2014-03-24 DIAGNOSIS — F1120 Opioid dependence, uncomplicated: Secondary | ICD-10-CM | POA: Diagnosis not present

## 2014-03-24 DIAGNOSIS — Z79899 Other long term (current) drug therapy: Secondary | ICD-10-CM | POA: Diagnosis not present

## 2014-03-24 DIAGNOSIS — Z5181 Encounter for therapeutic drug level monitoring: Secondary | ICD-10-CM | POA: Diagnosis not present

## 2014-03-24 DIAGNOSIS — F10229 Alcohol dependence with intoxication, unspecified: Secondary | ICD-10-CM | POA: Diagnosis not present

## 2014-03-29 DIAGNOSIS — F329 Major depressive disorder, single episode, unspecified: Secondary | ICD-10-CM | POA: Diagnosis not present

## 2014-03-30 DIAGNOSIS — F329 Major depressive disorder, single episode, unspecified: Secondary | ICD-10-CM | POA: Diagnosis not present

## 2014-03-31 DIAGNOSIS — F329 Major depressive disorder, single episode, unspecified: Secondary | ICD-10-CM | POA: Diagnosis not present

## 2014-03-31 DIAGNOSIS — F10229 Alcohol dependence with intoxication, unspecified: Secondary | ICD-10-CM | POA: Diagnosis not present

## 2014-03-31 DIAGNOSIS — Z79899 Other long term (current) drug therapy: Secondary | ICD-10-CM | POA: Diagnosis not present

## 2014-03-31 DIAGNOSIS — Z5181 Encounter for therapeutic drug level monitoring: Secondary | ICD-10-CM | POA: Diagnosis not present

## 2014-04-01 DIAGNOSIS — F329 Major depressive disorder, single episode, unspecified: Secondary | ICD-10-CM | POA: Diagnosis not present

## 2014-04-02 DIAGNOSIS — F329 Major depressive disorder, single episode, unspecified: Secondary | ICD-10-CM | POA: Diagnosis not present

## 2014-04-08 ENCOUNTER — Other Ambulatory Visit: Payer: Self-pay | Admitting: *Deleted

## 2014-04-08 DIAGNOSIS — M2550 Pain in unspecified joint: Secondary | ICD-10-CM

## 2014-04-09 MED ORDER — OXYCODONE-ACETAMINOPHEN 10-325 MG PO TABS: 1.0000 | ORAL_TABLET | Freq: Three times a day (TID) | ORAL | Status: DC | PRN

## 2014-04-09 NOTE — Telephone Encounter (Signed)
Pt informed

## 2014-04-09 NOTE — Telephone Encounter (Signed)
Lm for rtc 

## 2014-04-13 DIAGNOSIS — Z79899 Other long term (current) drug therapy: Secondary | ICD-10-CM | POA: Diagnosis not present

## 2014-04-13 DIAGNOSIS — F10229 Alcohol dependence with intoxication, unspecified: Secondary | ICD-10-CM | POA: Diagnosis not present

## 2014-04-13 DIAGNOSIS — F1120 Opioid dependence, uncomplicated: Secondary | ICD-10-CM | POA: Diagnosis not present

## 2014-04-13 DIAGNOSIS — Z5181 Encounter for therapeutic drug level monitoring: Secondary | ICD-10-CM | POA: Diagnosis not present

## 2014-04-19 DIAGNOSIS — F333 Major depressive disorder, recurrent, severe with psychotic symptoms: Secondary | ICD-10-CM | POA: Diagnosis not present

## 2014-04-19 DIAGNOSIS — Z79899 Other long term (current) drug therapy: Secondary | ICD-10-CM | POA: Diagnosis not present

## 2014-04-19 DIAGNOSIS — F10229 Alcohol dependence with intoxication, unspecified: Secondary | ICD-10-CM | POA: Diagnosis not present

## 2014-04-19 DIAGNOSIS — Z5181 Encounter for therapeutic drug level monitoring: Secondary | ICD-10-CM | POA: Diagnosis not present

## 2014-04-21 ENCOUNTER — Other Ambulatory Visit: Payer: Self-pay | Admitting: Internal Medicine

## 2014-04-24 DIAGNOSIS — F329 Major depressive disorder, single episode, unspecified: Secondary | ICD-10-CM | POA: Diagnosis not present

## 2014-04-25 DIAGNOSIS — F329 Major depressive disorder, single episode, unspecified: Secondary | ICD-10-CM | POA: Diagnosis not present

## 2014-04-27 DIAGNOSIS — Z79899 Other long term (current) drug therapy: Secondary | ICD-10-CM | POA: Diagnosis not present

## 2014-04-27 DIAGNOSIS — F11222 Opioid dependence with intoxication with perceptual disturbance: Secondary | ICD-10-CM | POA: Diagnosis not present

## 2014-04-27 DIAGNOSIS — F329 Major depressive disorder, single episode, unspecified: Secondary | ICD-10-CM | POA: Diagnosis not present

## 2014-04-27 DIAGNOSIS — Z5181 Encounter for therapeutic drug level monitoring: Secondary | ICD-10-CM | POA: Diagnosis not present

## 2014-04-28 DIAGNOSIS — F329 Major depressive disorder, single episode, unspecified: Secondary | ICD-10-CM | POA: Diagnosis not present

## 2014-04-29 DIAGNOSIS — F329 Major depressive disorder, single episode, unspecified: Secondary | ICD-10-CM | POA: Diagnosis not present

## 2014-04-30 DIAGNOSIS — F329 Major depressive disorder, single episode, unspecified: Secondary | ICD-10-CM | POA: Diagnosis not present

## 2014-05-04 DIAGNOSIS — Z79899 Other long term (current) drug therapy: Secondary | ICD-10-CM | POA: Diagnosis not present

## 2014-05-04 DIAGNOSIS — F10229 Alcohol dependence with intoxication, unspecified: Secondary | ICD-10-CM | POA: Diagnosis not present

## 2014-05-04 DIAGNOSIS — F333 Major depressive disorder, recurrent, severe with psychotic symptoms: Secondary | ICD-10-CM | POA: Diagnosis not present

## 2014-05-04 DIAGNOSIS — Z5181 Encounter for therapeutic drug level monitoring: Secondary | ICD-10-CM | POA: Diagnosis not present

## 2014-05-05 ENCOUNTER — Other Ambulatory Visit: Payer: Self-pay | Admitting: *Deleted

## 2014-05-05 MED ORDER — RIVAROXABAN 20 MG PO TABS: ORAL_TABLET | ORAL | Status: DC

## 2014-05-07 ENCOUNTER — Other Ambulatory Visit: Payer: Self-pay | Admitting: *Deleted

## 2014-05-07 DIAGNOSIS — M2550 Pain in unspecified joint: Secondary | ICD-10-CM

## 2014-05-08 DIAGNOSIS — R609 Edema, unspecified: Secondary | ICD-10-CM | POA: Diagnosis not present

## 2014-05-08 DIAGNOSIS — R32 Unspecified urinary incontinence: Secondary | ICD-10-CM | POA: Diagnosis not present

## 2014-05-08 DIAGNOSIS — M1990 Unspecified osteoarthritis, unspecified site: Secondary | ICD-10-CM | POA: Diagnosis not present

## 2014-05-08 DIAGNOSIS — J45901 Unspecified asthma with (acute) exacerbation: Secondary | ICD-10-CM | POA: Diagnosis not present

## 2014-05-10 ENCOUNTER — Telehealth: Payer: Self-pay | Admitting: *Deleted

## 2014-05-10 NOTE — Telephone Encounter (Signed)
Received faxed PA request from pt's pharmacy for her losartan potassium 100 mg tabs.  Take one tab twice a day.  Plan requires trial and failure of an ACE. Pt unable to take ACE secondary to angioedema.  Request was submitted online via Cover My Meds.  Request sent for review.Vanessa AlvineGoldston, Vanessa Cassady3/14/20169:02 AM

## 2014-05-11 ENCOUNTER — Telehealth: Payer: Self-pay | Admitting: Internal Medicine

## 2014-05-11 DIAGNOSIS — Z5181 Encounter for therapeutic drug level monitoring: Secondary | ICD-10-CM | POA: Diagnosis not present

## 2014-05-11 DIAGNOSIS — Z79899 Other long term (current) drug therapy: Secondary | ICD-10-CM | POA: Diagnosis not present

## 2014-05-11 DIAGNOSIS — F11222 Opioid dependence with intoxication with perceptual disturbance: Secondary | ICD-10-CM | POA: Diagnosis not present

## 2014-05-11 MED ORDER — OXYCODONE-ACETAMINOPHEN 10-325 MG PO TABS: 1.0000 | ORAL_TABLET | Freq: Three times a day (TID) | ORAL | Status: DC | PRN

## 2014-05-11 NOTE — Telephone Encounter (Signed)
Received faxed "notice of denial" for pt's losartan potassium with the following statement:   Based on the information your doctor gave us about your health condition, we are not able to approve LOSARTAN POTASSIUM.  Losartan 100mg  tablets (#2 per day) is denied for not meeting the qty limit requirement. Your plan allows you to receive #1 tablet per day.  Coverage for the requested qty requires the following:  1) The dose or higher qty is supported in the dosage and administration section of the manufactures prescribing information; OR   2) the dose or higher qty is supported by one the three Medicare approved compendia:   A) [facility] Formulary Service Drug Information; OR  B) [place] Pharmacopeia Drug Information (or its successor publication); OR   C) [employer] DRUGDEX Information System   Pt has an appt scheduled for 05/12/14 with pcp, will place appeal paperwork in MD's box for review.  Please advise.[name], Jurney Overacker Cassady3/15/20164:37 PM

## 2014-05-11 NOTE — Telephone Encounter (Signed)
Call to patient to confirm appointment for 05/12/14 at 10:45 lmtcb

## 2014-05-12 ENCOUNTER — Encounter: Payer: Self-pay | Admitting: Internal Medicine

## 2014-05-12 ENCOUNTER — Ambulatory Visit (INDEPENDENT_AMBULATORY_CARE_PROVIDER_SITE_OTHER): Payer: Medicare Other | Admitting: Internal Medicine

## 2014-05-12 VITALS — BP 105/74 | HR 82 | Temp 98.0°F | Ht 62.0 in | Wt 250.7 lb

## 2014-05-12 DIAGNOSIS — I82403 Acute embolism and thrombosis of unspecified deep veins of lower extremity, bilateral: Secondary | ICD-10-CM | POA: Diagnosis not present

## 2014-05-12 DIAGNOSIS — M2550 Pain in unspecified joint: Secondary | ICD-10-CM

## 2014-05-12 DIAGNOSIS — Z7901 Long term (current) use of anticoagulants: Secondary | ICD-10-CM | POA: Diagnosis not present

## 2014-05-12 DIAGNOSIS — J45909 Unspecified asthma, uncomplicated: Secondary | ICD-10-CM

## 2014-05-12 DIAGNOSIS — G8929 Other chronic pain: Secondary | ICD-10-CM

## 2014-05-12 DIAGNOSIS — Z0000 Encounter for general adult medical examination without abnormal findings: Secondary | ICD-10-CM

## 2014-05-12 DIAGNOSIS — I82503 Chronic embolism and thrombosis of unspecified deep veins of lower extremity, bilateral: Secondary | ICD-10-CM

## 2014-05-12 DIAGNOSIS — J4520 Mild intermittent asthma, uncomplicated: Secondary | ICD-10-CM

## 2014-05-12 DIAGNOSIS — N183 Chronic kidney disease, stage 3 unspecified: Secondary | ICD-10-CM

## 2014-05-12 DIAGNOSIS — I10 Essential (primary) hypertension: Secondary | ICD-10-CM | POA: Diagnosis not present

## 2014-05-12 DIAGNOSIS — R748 Abnormal levels of other serum enzymes: Secondary | ICD-10-CM

## 2014-05-12 LAB — CBC WITH DIFFERENTIAL/PLATELET
Basophils Absolute: 0 10*3/uL (ref 0.0–0.1)
Basophils Relative: 1 % (ref 0–1)
EOS ABS: 0.2 10*3/uL (ref 0.0–0.7)
EOS PCT: 5 % (ref 0–5)
HCT: 34.7 % — ABNORMAL LOW (ref 36.0–46.0)
Hemoglobin: 11.4 g/dL — ABNORMAL LOW (ref 12.0–15.0)
LYMPHS PCT: 30 % (ref 12–46)
Lymphs Abs: 1.3 10*3/uL (ref 0.7–4.0)
MCH: 29.6 pg (ref 26.0–34.0)
MCHC: 32.9 g/dL (ref 30.0–36.0)
MCV: 90.1 fL (ref 78.0–100.0)
MPV: 10.2 fL (ref 8.6–12.4)
Monocytes Absolute: 0.3 10*3/uL (ref 0.1–1.0)
Monocytes Relative: 7 % (ref 3–12)
Neutro Abs: 2.5 10*3/uL (ref 1.7–7.7)
Neutrophils Relative %: 57 % (ref 43–77)
Platelets: 217 10*3/uL (ref 150–400)
RBC: 3.85 MIL/uL — AB (ref 3.87–5.11)
RDW: 14.3 % (ref 11.5–15.5)
WBC: 4.3 10*3/uL (ref 4.0–10.5)

## 2014-05-12 LAB — COMPLETE METABOLIC PANEL WITH GFR
ALBUMIN: 3.7 g/dL (ref 3.5–5.2)
ALT: 9 U/L (ref 0–35)
AST: 16 U/L (ref 0–37)
Alkaline Phosphatase: 127 U/L — ABNORMAL HIGH (ref 39–117)
BUN: 17 mg/dL (ref 6–23)
CO2: 21 mEq/L (ref 19–32)
CREATININE: 1.59 mg/dL — AB (ref 0.50–1.10)
Calcium: 7.9 mg/dL — ABNORMAL LOW (ref 8.4–10.5)
Chloride: 106 mEq/L (ref 96–112)
GFR, Est African American: 40 mL/min — ABNORMAL LOW
GFR, Est Non African American: 35 mL/min — ABNORMAL LOW
Glucose, Bld: 103 mg/dL — ABNORMAL HIGH (ref 70–99)
Potassium: 4.2 mEq/L (ref 3.5–5.3)
Sodium: 139 mEq/L (ref 135–145)
Total Bilirubin: 0.7 mg/dL (ref 0.2–1.2)
Total Protein: 7 g/dL (ref 6.0–8.3)

## 2014-05-12 LAB — PROTIME-INR
INR: 2.49 — AB (ref 0.00–1.49)
Prothrombin Time: 27.1 seconds — ABNORMAL HIGH (ref 11.6–15.2)

## 2014-05-12 LAB — APTT: APTT: 49 s — AB (ref 24–37)

## 2014-05-12 MED ORDER — METOPROLOL TARTRATE 50 MG PO TABS: 50.0000 mg | ORAL_TABLET | Freq: Every day | ORAL | Status: DC

## 2014-05-12 NOTE — Patient Instructions (Signed)
General Instructions: Please schedule a follow up visit within the next 2 weeks for blood pressure check and  4 months with Dr. Criselda PeachesMullen.   For your medications:   Please bring all of your pill  Bottles with you to each visit.  This will help make sure that we have an up to date list of all the medications you are taking.  Please also bring any over the counter herbal medications you are taking (not including advil, tylenol, etc.)  Please continue taking most of your medications as prescribed.  We are requesting that your Xarelto be restarted from the insurance company.  Please DECREASE your Losartan and only take it ONCE a day.  Please come back to the clinic in 2 weeks for a blood pressure check.  You can make an appointment with any provider.    You will go home with stool cards.  Please complete these and send them back to the clinic.    Thank you!    Treatment Goals:  Goals (1 Years of Data) as of 05/12/14          As of Today 01/06/14 09/09/13 06/02/13 05/14/13     Blood Pressure   . Blood Pressure < 140/90  105/74 117/66 125/87 116/78 112/78      Progress Toward Treatment Goals:  Treatment Goal 05/12/2014  Blood pressure at goal  Prevent falls -    Self Care Goals & Plans:  Self Care Goal 05/12/2014  Manage my medications bring my medications to every visit; take my medicines as prescribed; refill my medications on time  Monitor my health -  Eat healthy foods eat more vegetables; eat baked foods instead of fried foods; eat foods that are low in salt  Be physically active find an activity I enjoy; take a walk every day  Prevent falls -  Meeting treatment goals -    No flowsheet data found.   Care Management & Community Referrals:  Referral 06/02/2013  Referrals made for care management support none needed  Referrals made to community resources none    Fecal Occult Blood Test This is a test done on a stool specimen to screen for gastrointestinal bleeding, which may be  an indicator of colon cancer Is is usually done as part of a routine examination, annually, after age 62 or as directed by your caregiver. The fecal occult blood test (FOBT) checks for blood in your stool. Normally, there will not be enough blood lost through the gastrointestinal tract to turn an FOBT positive or for you to notice it visually in the form of bloody or dark, tarry stools. Any significant amount of blood being passed should be investigated.  A positive FOBT will tell your caregiver that you have bleeding occurring somewhere in your gastrointestinal tract. This blood loss could be due to ulcers, diverticulosis, bleeding polyps, inflammatory bowel disease, hemorrhoids, from swallowed blood due to bleeding gums or nosebleeds, or it could be due to benign or cancerous tumors. Anything that protrudes into the lumen (the empty space in the intestine), like a polyp or tumor, and is rubbed against by the fecal waste as it passes through has the potential to eventually bleed intermittently. Often this small amount of blood is the first, and sometimes the only, symptom of early colon cancer, making the FOBT a valuable screening tool. PREPARATION FOR TEST  You should not eat red meat within three days before testing. Other substances that could cause a false positive test result include fish, turnips,  horseradish, and drugs such as colchicines and oxidizing drugs (for example, iodine and boric acid). Be sure to carefully follow your caregiver's instructions. With FOBT, your caregiver or laboratory will give you one or more test "cards." You collect a separate sample from three different stools, usually on consecutive days. Each stool sample should be collected into a clean container and should not be contaminated with urine or water. The slide is labeled with your name and the date; then, with an applicator stick, you apply a thin smear of stool onto each filter paper square/window contained on the card.  Allow the filter paper to dry. Once it is dry, it is stable. Usually you will collect all of the consecutive samples, and then return all of them to your caregiver or laboratory at the same time, sometimes by mailing them. There are also over the counter tests which are dropped in your toilet. NORMAL FINDINGS   No occult blood within the stool.  The FOBT test is normally negative. A positive indicates either blood in the stool or an interfering substance. Multiple samples are done to: 1) catch intermittent bleeding; and 2) help rule out false positives. Ranges for normal findings may vary among different laboratories and hospitals. You should always check with your doctor after having lab work or other tests done to discuss the meaning of your test results and whether your values are considered within normal limits. MEANING OF TEST  Your caregiver will go over the test results with you and discuss the importance and meaning of your results, as well as treatment options and the need for additional tests if necessary. OBTAINING THE TEST RESULTS  It is your responsibility to obtain your test results. Ask the lab or department performing the test when and how you will get your results. Document Released: 03/09/2004 Document Revised: 05/07/2011 Document Reviewed: 01/23/2008 [facility] Patient Information 2015 [place], [place]. This information is not intended to replace advice given to you by your health care provider. Make sure you discuss any questions you have with your health care provider.

## 2014-05-12 NOTE — Progress Notes (Signed)
   Subjective:    Patient ID: Vanessa Palmer, female    DOB: [birth year], 64 y.o.   MRN: [id number]  CC: 4 month follow up for HTN, CAD, DVT  HPI  Vanessa Palmer is a 64yo woman with pmH of asthma, CAD, OA, DVT, CKD, HTN, Gout, HLD, Depression, pre-DM, urinary incontinence who presents for follow up of chronic medical issuse.    Had a fall last month, legs gave out.  Did not hit head.  She called her aide and they got her up.  She did not present for work up.  She has not had any further falls and specifically denies chest pain, injury, weakness, syncope, lightheadedness, SOB.    She notes that the new dose of her pain medication keeps her very active, makes it easier to do her daily activities and she is very happy with this.  She signed a new pain contract today.   Xarelto denied by [employer].  She denies FH of DVT, recurrent DVT, never been on warfarin.   She did not bring in her medication.  Decreased losartan to once a day given her insurance would not cover twice a day, also, her blood pressure is improved after she has lost some weight.    She further asked me to fill out some disability paperwork which I will attempt to do.    She did not bring in her medications.  We verbally reviewed them, but I do not have a good sense that she knows what her medications are for.    Review of Systems  Constitutional: Negative for chills, fatigue and unexpected weight change.  HENT: Negative for ear pain and hearing loss.   Eyes: Negative for photophobia and visual disturbance.  Respiratory: Negative for cough, choking and shortness of breath.   Cardiovascular: Positive for chest pain (a little bit, gets better with rest). Negative for leg swelling.  Gastrointestinal: Negative for nausea, vomiting and abdominal pain.  Genitourinary: Negative for dysuria, enuresis and difficulty urinating.  Musculoskeletal: Positive for back pain and gait problem (uses cane or walker).  Skin: Negative for  rash and wound.  Neurological: Positive for weakness (knees gave out). Negative for dizziness, light-headedness and headaches.  Psychiatric/Behavioral: Negative for confusion and decreased concentration.       Objective:   Physical Exam  Constitutional: She is oriented to person, place, and time. She appears well-developed and well-nourished. No distress.  HENT:  Head: Normocephalic and atraumatic.  Eyes: Conjunctivae are normal. No scleral icterus.  Cardiovascular: Normal rate, regular rhythm and normal heart sounds.   No murmur heard. Pulmonary/Chest: Effort normal and breath sounds normal. No respiratory distress. She has no wheezes.  Abdominal: Soft. Bowel sounds are normal.  Musculoskeletal: She exhibits edema (mild edema in bilateral ankles). She exhibits no tenderness.  Using cane  Neurological: She is alert and oriented to person, place, and time.  Skin: Skin is warm and dry.  Psychiatric: She has a normal mood and affect. Her behavior is normal.   BMET, INR, ESR today, stool cards sent home.       Assessment & Plan:  RTC in 2 weeks for BP check and then in 4 months

## 2014-05-12 NOTE — Progress Notes (Signed)
Copy of Pain Contract given to pt. 

## 2014-05-13 LAB — SEDIMENTATION RATE: Sed Rate: 53 mm/hr — ABNORMAL HIGH (ref 0–30)

## 2014-05-14 ENCOUNTER — Telehealth: Payer: Self-pay | Admitting: *Deleted

## 2014-05-14 NOTE — Telephone Encounter (Signed)
Received faxed form stating medication has been approved through 05/12/2015.[name], Ezella Kell Cassady3/18/20169:46 AM

## 2014-05-14 NOTE — Assessment & Plan Note (Signed)
Stool cards today.  

## 2014-05-14 NOTE — Assessment & Plan Note (Signed)
Chronic, causes occasional falls due to pain in knees, using cane.  She is taking Percocet 10/325 up to 3 times a day which helps her remain much more active.  I have seen a positive improvement in her walking and activity since taking care of her.  She signed a new pain contract and the FYI will be updated.  She is now on a stable dose.  She also takes prn flexeril with good results.

## 2014-05-14 NOTE — Assessment & Plan Note (Signed)
BP Readings from Last 3 Encounters:  05/12/14 105/74  01/06/14 117/66  09/09/13 125/87    Lab Results  Component Value Date   NA 139 05/12/2014   K 4.2 05/12/2014   CREATININE 1.59* 05/12/2014    Assessment: Blood pressure control: controlled Progress toward BP goal:  at goal Comments: doing well, BMET today  Plan: Medications:  continue current medications, decrease losartan to 100mg  qdaily, amlodipine and metoprolol Educational resources provided:   Self management tools provided:   Other plans: RTC in 2 weeks for BP check on lower dose of losartan.  If continues well controlled, no change to therapy.

## 2014-05-14 NOTE — Telephone Encounter (Signed)
Pharmacy aware, message left on pt's recorder, phone call complete .Vanessa Palmer, Vanessa Cassady3/18/20162:20 PM

## 2014-05-14 NOTE — Assessment & Plan Note (Signed)
Stable on flovent.  Continue

## 2014-05-14 NOTE — Assessment & Plan Note (Signed)
At previous visit we discussed her risk of recurrence and she preferred to be on xarelto until the risks of the medication outweighed the benefit.  She has a HAS BLED score of 0.  Her insurance, however, has not approved xarelto.  Prior auth paperwork was filled out today.  We discussed her risk for hypercoagulable disease and she has no risk factors including no family history, no previous clots, not in an unusual vascular bed, > 45, not arterial and she has no history of warfarin skin necrosis.  She likely does not need to be screened for hypercoagulable state.  I checked an ESR (mildly high), INR (elevated, likely related to xarelto) and asked her to bring back hemocult cards.  She had a previous positive hemocult and reports having a colonoscopy, but results have not been able to be obtained.  No further work up at this time.  Continue xarelto once insurance has approved it.

## 2014-05-17 ENCOUNTER — Emergency Department (HOSPITAL_COMMUNITY)
Admission: EM | Admit: 2014-05-17 | Discharge: 2014-05-18 | Disposition: A | Discharge status: Home / Self Care | Payer: Medicare Other | Attending: Emergency Medicine | Admitting: Emergency Medicine

## 2014-05-17 ENCOUNTER — Encounter (HOSPITAL_COMMUNITY): Payer: Self-pay | Admitting: *Deleted

## 2014-05-17 DIAGNOSIS — Y939 Activity, unspecified: Secondary | ICD-10-CM | POA: Insufficient documentation

## 2014-05-17 DIAGNOSIS — X58XXXA Exposure to other specified factors, initial encounter: Secondary | ICD-10-CM | POA: Diagnosis not present

## 2014-05-17 DIAGNOSIS — R220 Localized swelling, mass and lump, head: Secondary | ICD-10-CM | POA: Diagnosis not present

## 2014-05-17 DIAGNOSIS — Z79899 Other long term (current) drug therapy: Secondary | ICD-10-CM | POA: Diagnosis not present

## 2014-05-17 DIAGNOSIS — L500 Allergic urticaria: Secondary | ICD-10-CM | POA: Diagnosis not present

## 2014-05-17 DIAGNOSIS — Y999 Unspecified external cause status: Secondary | ICD-10-CM | POA: Diagnosis not present

## 2014-05-17 DIAGNOSIS — F329 Major depressive disorder, single episode, unspecified: Secondary | ICD-10-CM | POA: Diagnosis not present

## 2014-05-17 DIAGNOSIS — Z86718 Personal history of other venous thrombosis and embolism: Secondary | ICD-10-CM | POA: Diagnosis not present

## 2014-05-17 DIAGNOSIS — I129 Hypertensive chronic kidney disease with stage 1 through stage 4 chronic kidney disease, or unspecified chronic kidney disease: Secondary | ICD-10-CM | POA: Insufficient documentation

## 2014-05-17 DIAGNOSIS — Y929 Unspecified place or not applicable: Secondary | ICD-10-CM | POA: Diagnosis not present

## 2014-05-17 DIAGNOSIS — E669 Obesity, unspecified: Secondary | ICD-10-CM | POA: Diagnosis not present

## 2014-05-17 DIAGNOSIS — T7840XA Allergy, unspecified, initial encounter: Secondary | ICD-10-CM | POA: Insufficient documentation

## 2014-05-17 DIAGNOSIS — E785 Hyperlipidemia, unspecified: Secondary | ICD-10-CM | POA: Diagnosis not present

## 2014-05-17 DIAGNOSIS — R21 Rash and other nonspecific skin eruption: Secondary | ICD-10-CM | POA: Insufficient documentation

## 2014-05-17 DIAGNOSIS — I2510 Atherosclerotic heart disease of native coronary artery without angina pectoris: Secondary | ICD-10-CM | POA: Insufficient documentation

## 2014-05-17 DIAGNOSIS — Z8739 Personal history of other diseases of the musculoskeletal system and connective tissue: Secondary | ICD-10-CM | POA: Diagnosis not present

## 2014-05-17 DIAGNOSIS — J45901 Unspecified asthma with (acute) exacerbation: Secondary | ICD-10-CM | POA: Diagnosis not present

## 2014-05-17 DIAGNOSIS — N183 Chronic kidney disease, stage 3 (moderate): Secondary | ICD-10-CM | POA: Diagnosis not present

## 2014-05-17 DIAGNOSIS — I509 Heart failure, unspecified: Secondary | ICD-10-CM | POA: Diagnosis not present

## 2014-05-17 DIAGNOSIS — Z9981 Dependence on supplemental oxygen: Secondary | ICD-10-CM | POA: Insufficient documentation

## 2014-05-17 DIAGNOSIS — L299 Pruritus, unspecified: Secondary | ICD-10-CM | POA: Diagnosis not present

## 2014-05-17 DIAGNOSIS — G4733 Obstructive sleep apnea (adult) (pediatric): Secondary | ICD-10-CM | POA: Insufficient documentation

## 2014-05-17 MED ORDER — FAMOTIDINE IN NACL 20-0.9 MG/50ML-% IV SOLN
20.0000 mg | Freq: Once | INTRAVENOUS | Status: AC
  Administered 2014-05-18: 20 mg via INTRAVENOUS
  Filled 2014-05-17: qty 50

## 2014-05-17 MED ORDER — HYDROXYZINE HCL 10 MG PO TABS
10.0000 mg | ORAL_TABLET | Freq: Once | ORAL | Status: AC
  Administered 2014-05-18: 10 mg via ORAL
  Filled 2014-05-17: qty 1

## 2014-05-17 MED ORDER — METHYLPREDNISOLONE SODIUM SUCC 125 MG IJ SOLR
125.0000 mg | Freq: Once | INTRAMUSCULAR | Status: AC
  Administered 2014-05-18: 125 mg via INTRAVENOUS
  Filled 2014-05-17: qty 2

## 2014-05-17 NOTE — ED Provider Notes (Signed)
CSN: [id number]     Arrival date & time 05/17/14  2344 History  This chart was scribed for Loren Racer, MD by Richarda Overlie, ED Scribe. This patient was seen in room B16C/B16C and the patient's care was started 11:49 PM.    Chief Complaint  Patient presents with  . Allergic Reaction   The history is provided by the patient. No language interpreter was used.   HPI Comments: DONDREA CLENDENIN is a 64 y.o. female with a history of HTN, CAD, CKD, CHF and DVT who presents to the Emergency Department complaining of an allergic reaction that started 45 minutes ago after pt took a friend's tylenol. She states that the tylenol is the only new thing she has taken since the onset of her symptoms. Pt complains of lip swelling and an itchy rash to her right forearm and underarm that started approximately 30 minutes ago. Pt reports that she took  of benadryl prior to EMS arrival but states that her symptoms are unchanged. She denies wheezing or intraoral swelling.  Past Medical History  Diagnosis Date  . Hyperlipidemia   . Hypertension   . Gout   . Obesity   . Depression   . DJD (degenerative joint disease)   . OSA (obstructive sleep apnea)     previously used CPAP, has lost  . CAD (coronary artery disease)     s/p non-Q wave MI in 06/2001 and 2004, 2005  . ALCOHOL ABUSE 12/13/2005    Annotation: Sober since 11/06 Qualifier: Diagnosis of  By: Wallace Cullens MD, Natalia Leatherwood    . INTRINSIC ASTHMA, WITH EXACERBATION 09/27/2009    Qualifier: Diagnosis of  By: Denton Meek MD, Tillie Rung    . Chronic kidney disease (CKD), stage III (moderate) 09/19/2010  . CHF (congestive heart failure)   . DVT of lower extremity, bilateral 04/04/2012    On Xarelto     Past Surgical History  Procedure Laterality Date  . Partial hysterectomy      due to endometriosis  . Tubal ligation    . Cardiac catheterization      06/2001, 02/2002, 10/2004 (10-15% proximal stenosis of circumflex, diffuse disease of  OM1 and RCA)   Family History   Problem Relation Age of Onset  . Mental illness Mother   . Heart disease Father   . Diabetes Sister   . Diabetes Sister   . Diabetes Sister    History  Substance Use Topics  . Smoking status: Never Smoker   . Smokeless tobacco: Not on file  . Alcohol Use: No   OB History    No data available     Review of Systems  Constitutional: Negative for fever and chills.  HENT: Positive for facial swelling. Negative for trouble swallowing.   Respiratory: Negative for shortness of breath, wheezing and stridor.   Cardiovascular: Negative for chest pain and leg swelling.  Gastrointestinal: Negative for nausea, vomiting and abdominal pain.  Skin: Positive for rash.  Neurological: Negative for dizziness, weakness, light-headedness and numbness.  All other systems reviewed and are negative.     Allergies  Ace inhibitors; Hydromorphone hcl; Percocet; Propoxyphene n-acetaminophen; and Vicodin  Home Medications   Prior to Admission medications   Medication Sig Start Date End Date Taking? Authorizing Provider  acetaminophen (TYLENOL) 500 MG tablet Take 1,000 mg by mouth every 6 (six) hours as needed for moderate pain.   Yes Historical Provider, MD  amLODipine (NORVASC) 10 MG tablet TAKE 1 TABLET (10 MG TOTAL) BY MOUTH DAILY. 04/22/14  Yes Inez CatalinaEmily B Mullen, MD  atorvastatin (LIPITOR) 40 MG tablet TAKE 1 TABLET (40 MG TOTAL) BY MOUTH DAILY. 04/22/14  Yes Inez CatalinaEmily B Mullen, MD  buPROPion (WELLBUTRIN XL) 150 MG 24 hr tablet Take 1 tablet (150 mg total) by mouth every morning. 05/14/13  Yes Kristie CowmanKaren Schooler, MD  chlorthalidone (HYGROTON) 25 MG tablet Take 25 mg by mouth daily.   Yes Historical Provider, MD  cyclobenzaprine (FLEXERIL) 10 MG tablet Take 0.5 tablets (5 mg total) by mouth 2 (two) times daily as needed for muscle spasms. 03/03/13  Yes Purvis SheffieldForrest Harrison, MD  fluticasone (FLONASE) 50 MCG/ACT nasal spray INSTILL TWO   SPRAYS IN EACH NOSTRIL ONCE DAILY Patient taking differently: INSTILL TWO   SPRAYS  IN EACH NOSTRIL ONCE DAILY AS NEEDED FOR ALLERGIES 03/02/14  Yes Inez CatalinaEmily B Mullen, MD  losartan (COZAAR) 100 MG tablet TAKE 1 TABLET (100 MG TOTAL) BY MOUTH TWO   TIMES DAILY. Patient taking differently: TAKE 1 TABLET (100 MG TOTAL) BY MOUTH DAILY 03/02/14  Yes Inez CatalinaEmily B Mullen, MD  metoprolol (LOPRESSOR) 50 MG tablet Take 1 tablet (50 mg total) by mouth daily. 05/12/14 05/12/15 Yes Inez CatalinaEmily B Mullen, MD  nitroGLYCERIN (NITROSTAT) 0.4 MG SL tablet Place 0.4 mg under the tongue every 5 (five) minutes as needed for chest pain.   Yes Historical Provider, MD  Nutritional Supplements (COLD AND FLU PO) Take 30 mLs by mouth daily as needed (for cold).   Yes Historical Provider, MD  omeprazole (PRILOSEC) 40 MG capsule Take 1 capsule (40 mg total) by mouth daily.   Yes Doneen PoissonLawrence Klima, MD  oxyCODONE-acetaminophen (PERCOCET) 10-325 MG per tablet Take 1 tablet by mouth every 8 (eight) hours as needed for pain. 05/11/14 05/03/15 Yes Inez CatalinaEmily B Mullen, MD  rivaroxaban (XARELTO) 20 MG TABS tablet TAKE 1 TABLET (20 MG TOTAL) BY MOUTH DAILY WITH SUPPER. 05/05/14  Yes Inez CatalinaEmily B Mullen, MD  TOVIAZ 8 MG TB24 Take 8 mg by mouth Daily. 11/01/11  Yes Historical Provider, MD  traZODone (DESYREL) 50 MG tablet Take 0.5 tablets (25 mg total) by mouth at bedtime.   Yes Doneen PoissonLawrence Klima, MD  b complex vitamins tablet Take 1 tablet by mouth daily. Patient not taking: Reported on 05/18/2014 01/06/14   Inez CatalinaEmily B Mullen, MD  famotidine (PEPCID) 20 MG tablet Take 1 tablet (20 mg total) by mouth 2 (two) times daily. 05/18/14   Loren Raceravid Syd Manges, MD  fluticasone (FLOVENT HFA) 44 MCG/ACT inhaler Inhale 2 puffs into the lungs 2 (two) times daily. Patient not taking: Reported on 05/18/2014 06/02/13   Annett Gularacy N McLean, MD  hydrOXYzine (ATARAX/VISTARIL) 25 MG tablet Take 1 tablet (25 mg total) by mouth every 6 (six) hours as needed for itching. 05/18/14   Loren Raceravid Korissa Horsford, MD  predniSONE (DELTASONE) 20 MG tablet 3 tabs po day one, then 2 po daily x 4 days 05/18/14   Loren Raceravid  Atlee Villers, MD   BP 121/76 mmHg  Pulse 89  Temp(Src) 98.8 F (37.1 C) (Oral)  Resp 18  Ht 5\' 2"  (1.575 m)  Wt 250 lb (113.399 kg)  BMI 45.71 kg/m2  SpO2 100% Physical Exam  Constitutional: She is oriented to person, place, and time. She appears well-developed and well-nourished. No distress.  HENT:  Head: Normocephalic and atraumatic.  Mouth/Throat: Oropharynx is clear and moist.  Very mild right upper lip swelling. There is no intraoral swelling. Patient speaks in clear voice.  Eyes: EOM are normal. Pupils are equal, round, and reactive to light. Right eye exhibits no discharge.  Left eye exhibits no discharge.  Neck: Normal range of motion. Neck supple. No tracheal deviation present.  Cardiovascular: Normal rate and regular rhythm.   Pulmonary/Chest: Effort normal and breath sounds normal. No stridor. No respiratory distress. She has no wheezes. She has no rales. She exhibits no tenderness.  Abdominal: Soft. Bowel sounds are normal. She exhibits no distension and no mass. There is no tenderness. There is no rebound and no guarding.  Musculoskeletal: Normal range of motion. She exhibits no edema or tenderness.  Lymphadenopathy:    She has no cervical adenopathy.  Neurological: She is alert and oriented to person, place, and time.  Moves all extremities without deficit. Sensation is grossly intact.  Skin: Skin is warm and dry. Rash noted. No erythema.  Raised plaque in the right axilla and erythematous papules to the flexor surface of the right forearm  Psychiatric: She has a normal mood and affect. Her behavior is normal.  Nursing note and vitals reviewed.   ED Course  Procedures   DIAGNOSTIC STUDIES: Oxygen Saturation is 100% on RA, normal by my interpretation.    COORDINATION OF CARE: 11:52 AM Discussed treatment plan with pt at bedside and pt agreed to plan.   Labs Review Labs Reviewed - No data to display  Imaging Review No results found.   EKG  Interpretation None      MDM   Final diagnoses:  Allergic reaction, initial encounter    I personally performed the services described in this documentation, which was scribed in my presence. The recorded information has been reviewed and is accurate.  Lip swelling has resolved. Patient states the itching is improved and hives are improved as well.   Patient observed in the emergency department over the last 4 hours. We'll discharge home. Return precautions given.    Loren Racer, MD 05/18/14 [phone]

## 2014-05-17 NOTE — ED Notes (Signed)
Pt arrives via EMS from home. Pt took friends Tylenol and started experiencing itching and lip swelling. Pt took 50 benadryl prior to EMS arrival.

## 2014-05-18 DIAGNOSIS — T7840XA Allergy, unspecified, initial encounter: Secondary | ICD-10-CM | POA: Diagnosis not present

## 2014-05-18 MED ORDER — PREDNISONE 20 MG PO TABS: ORAL_TABLET | ORAL | Status: DC

## 2014-05-18 MED ORDER — FAMOTIDINE 20 MG PO TABS: 20.0000 mg | ORAL_TABLET | Freq: Two times a day (BID) | ORAL | Status: DC

## 2014-05-18 MED ORDER — HYDROXYZINE HCL 25 MG PO TABS: 25.0000 mg | ORAL_TABLET | Freq: Four times a day (QID) | ORAL | Status: DC | PRN

## 2014-05-18 NOTE — ED Notes (Signed)
PT calling a taxi to take her home.

## 2014-05-18 NOTE — Discharge Instructions (Signed)

## 2014-05-19 DIAGNOSIS — F10229 Alcohol dependence with intoxication, unspecified: Secondary | ICD-10-CM | POA: Diagnosis not present

## 2014-05-19 DIAGNOSIS — Z5181 Encounter for therapeutic drug level monitoring: Secondary | ICD-10-CM | POA: Diagnosis not present

## 2014-05-19 DIAGNOSIS — Z79899 Other long term (current) drug therapy: Secondary | ICD-10-CM | POA: Diagnosis not present

## 2014-05-19 DIAGNOSIS — F1120 Opioid dependence, uncomplicated: Secondary | ICD-10-CM | POA: Diagnosis not present

## 2014-05-22 DIAGNOSIS — F329 Major depressive disorder, single episode, unspecified: Secondary | ICD-10-CM | POA: Diagnosis not present

## 2014-05-23 DIAGNOSIS — F329 Major depressive disorder, single episode, unspecified: Secondary | ICD-10-CM | POA: Diagnosis not present

## 2014-05-24 DIAGNOSIS — F329 Major depressive disorder, single episode, unspecified: Secondary | ICD-10-CM | POA: Diagnosis not present

## 2014-05-25 DIAGNOSIS — F329 Major depressive disorder, single episode, unspecified: Secondary | ICD-10-CM | POA: Diagnosis not present

## 2014-05-25 DIAGNOSIS — Z79899 Other long term (current) drug therapy: Secondary | ICD-10-CM | POA: Diagnosis not present

## 2014-05-25 DIAGNOSIS — F333 Major depressive disorder, recurrent, severe with psychotic symptoms: Secondary | ICD-10-CM | POA: Diagnosis not present

## 2014-05-25 DIAGNOSIS — Z5181 Encounter for therapeutic drug level monitoring: Secondary | ICD-10-CM | POA: Diagnosis not present

## 2014-05-25 DIAGNOSIS — F10229 Alcohol dependence with intoxication, unspecified: Secondary | ICD-10-CM | POA: Diagnosis not present

## 2014-05-26 ENCOUNTER — Telehealth: Payer: Self-pay | Admitting: Internal Medicine

## 2014-05-26 DIAGNOSIS — F329 Major depressive disorder, single episode, unspecified: Secondary | ICD-10-CM | POA: Diagnosis not present

## 2014-05-26 NOTE — Telephone Encounter (Signed)
Call to patient to confirm appointment for 05/27/14 at 1:30 lmtcb

## 2014-05-27 ENCOUNTER — Encounter: Payer: Self-pay | Admitting: Internal Medicine

## 2014-05-27 ENCOUNTER — Ambulatory Visit (INDEPENDENT_AMBULATORY_CARE_PROVIDER_SITE_OTHER): Payer: Medicare Other | Admitting: Internal Medicine

## 2014-05-27 VITALS — BP 115/85 | HR 78 | Temp 98.0°F | Ht 62.0 in | Wt 244.4 lb

## 2014-05-27 DIAGNOSIS — Z0000 Encounter for general adult medical examination without abnormal findings: Secondary | ICD-10-CM | POA: Diagnosis not present

## 2014-05-27 DIAGNOSIS — I10 Essential (primary) hypertension: Secondary | ICD-10-CM | POA: Diagnosis not present

## 2014-05-27 DIAGNOSIS — F329 Major depressive disorder, single episode, unspecified: Secondary | ICD-10-CM | POA: Diagnosis not present

## 2014-05-27 NOTE — Assessment & Plan Note (Signed)
BP Readings from Last 3 Encounters:  05/27/14 115/85  05/18/14 118/86  05/12/14 105/74    Lab Results  Component Value Date   NA 139 05/12/2014   K 4.2 05/12/2014   CREATININE 1.59* 05/12/2014    Assessment: Blood pressure control: controlled Progress toward BP goal:  at goal Comments: BP well controlled on losartan 100 mg daily (recently decreased due to insurance authorization issue), amlodipine 10 mg daily, chlorthalidone 25 mg daily, lopressor 25 mg daily  Plan: Medications:  continue current medications Educational resources provided:   Self management tools provided:   Other plans: RTC 3 months

## 2014-05-27 NOTE — Addendum Note (Signed)
Addended by: Doneen PoissonKLIMA, Desean Heemstra D on: 05/27/2014 04:44 PM   Modules accepted: Level of Service

## 2014-05-27 NOTE — Progress Notes (Signed)
   Subjective:    Patient ID: Vanessa Palmer, female    DOB: 03-27-1950, 64 y.o.   MRN: [id number]  HPI  Vanessa Palmer is a 64 year old woman with CAD, HTN, DVT, CKD, asthma, OA, pre-DM, depression, HL here for follow-up on her HTN. [facility] who addressed her chronic co-morbidities. At that time, her losartan was decreased to 100 mg daily and she was continued on amlodipine 10 mg daily, chlorthalidone 25 mg daily, lopressor 50 mg daily.  Today, she is feeling well with no complaints. She just wanted her BP checked. She says she has been actively trying to lose weight with several diet changes when asked about recent weight loss. She says she did not bring stool cards because "Dr Criselda PeachesMullen should know me by now that I am not going to do that."  Review of Systems  Constitutional: Negative for fever, chills and diaphoresis.  Respiratory: Negative for shortness of breath.   Cardiovascular: Negative for chest pain.  Gastrointestinal: Negative for nausea, vomiting, abdominal pain, diarrhea and constipation.  Neurological: Negative for dizziness, weakness, light-headedness and numbness.       Objective:   Physical Exam  Constitutional: She is oriented to person, place, and time. She appears well-developed and well-nourished. No distress.  HENT:  Head: Normocephalic and atraumatic.  Mouth/Throat: Oropharynx is clear and moist.  Cardiovascular: Normal rate, regular rhythm, normal heart sounds and intact distal pulses.  Exam reveals no gallop and no friction rub.   No murmur heard. Pulmonary/Chest: Effort normal and breath sounds normal. No respiratory distress. She has no wheezes.  Abdominal: Soft. Bowel sounds are normal. She exhibits no distension. There is no tenderness.  Neurological: She is alert and oriented to person, place, and time.  Skin: She is not diaphoretic.  Vitals reviewed.         Assessment & Plan:

## 2014-05-27 NOTE — Patient Instructions (Signed)
It was a pleasure to see you today. Your blood pressure was good today so we made no changes. Please return to clinic or seek medical attention if you have any new or worsening chest pain, trouble breathing, or other worrisome medical condition. We look forward to seeing you again in 3 months.  Farley LyAdam Demaris Bousquet, MD  General Instructions:   Please try to bring all your medicines next time. This will help us keep you safe from mistakes.   Progress Toward Treatment Goals:  Treatment Goal 05/27/2014  Blood pressure at goal  Prevent falls -    Self Care Goals & Plans:  Self Care Goal 05/27/2014  Manage my medications take my medicines as prescribed; bring my medications to every visit; refill my medications on time  Monitor my health -  Eat healthy foods eat more vegetables; eat foods that are low in salt; eat baked foods instead of fried foods  Be physically active -  Prevent falls -  Meeting treatment goals -    No flowsheet data found.   Care Management & Community Referrals:  Referral 06/02/2013  Referrals made for care management support none needed  Referrals made to community resources none

## 2014-05-27 NOTE — Progress Notes (Signed)
Case discussed with Dr. Rothman at time of visit. We reviewed the resident's history and exam and pertinent patient test results. I agree with the assessment, diagnosis, and plan of care documented in the resident's note. 

## 2014-05-27 NOTE — Assessment & Plan Note (Signed)
Vanessa Palmer did not bring her stool cards today. I encouraged her to bring them but she says this is unlikely and she is not due for colonoscopy. She has been trying to lose weight by eating fresh food, baking instead of frying food, etc. She thinks this is why she is down 6 lbs from 2 weeks ago and has been losing weight. -reminded to bring stool cards -cont to monitor weight

## 2014-05-28 DIAGNOSIS — F329 Major depressive disorder, single episode, unspecified: Secondary | ICD-10-CM | POA: Diagnosis not present

## 2014-06-02 DIAGNOSIS — F333 Major depressive disorder, recurrent, severe with psychotic symptoms: Secondary | ICD-10-CM | POA: Diagnosis not present

## 2014-06-02 DIAGNOSIS — F10229 Alcohol dependence with intoxication, unspecified: Secondary | ICD-10-CM | POA: Diagnosis not present

## 2014-06-02 DIAGNOSIS — Z5181 Encounter for therapeutic drug level monitoring: Secondary | ICD-10-CM | POA: Diagnosis not present

## 2014-06-02 DIAGNOSIS — Z79899 Other long term (current) drug therapy: Secondary | ICD-10-CM | POA: Diagnosis not present

## 2014-06-04 ENCOUNTER — Other Ambulatory Visit (HOSPITAL_COMMUNITY): Payer: Self-pay | Admitting: Cardiology

## 2014-06-04 DIAGNOSIS — N189 Chronic kidney disease, unspecified: Secondary | ICD-10-CM | POA: Diagnosis not present

## 2014-06-04 DIAGNOSIS — I252 Old myocardial infarction: Secondary | ICD-10-CM | POA: Diagnosis not present

## 2014-06-04 DIAGNOSIS — R079 Chest pain, unspecified: Secondary | ICD-10-CM

## 2014-06-04 DIAGNOSIS — I10 Essential (primary) hypertension: Secondary | ICD-10-CM | POA: Diagnosis not present

## 2014-06-04 DIAGNOSIS — I2510 Atherosclerotic heart disease of native coronary artery without angina pectoris: Secondary | ICD-10-CM | POA: Diagnosis not present

## 2014-06-05 DIAGNOSIS — F329 Major depressive disorder, single episode, unspecified: Secondary | ICD-10-CM | POA: Diagnosis not present

## 2014-06-06 DIAGNOSIS — F329 Major depressive disorder, single episode, unspecified: Secondary | ICD-10-CM | POA: Diagnosis not present

## 2014-06-07 DIAGNOSIS — F10229 Alcohol dependence with intoxication, unspecified: Secondary | ICD-10-CM | POA: Diagnosis not present

## 2014-06-07 DIAGNOSIS — Z5181 Encounter for therapeutic drug level monitoring: Secondary | ICD-10-CM | POA: Diagnosis not present

## 2014-06-07 DIAGNOSIS — Z79899 Other long term (current) drug therapy: Secondary | ICD-10-CM | POA: Diagnosis not present

## 2014-06-07 DIAGNOSIS — F1120 Opioid dependence, uncomplicated: Secondary | ICD-10-CM | POA: Diagnosis not present

## 2014-06-07 DIAGNOSIS — F329 Major depressive disorder, single episode, unspecified: Secondary | ICD-10-CM | POA: Diagnosis not present

## 2014-06-08 DIAGNOSIS — J45901 Unspecified asthma with (acute) exacerbation: Secondary | ICD-10-CM | POA: Diagnosis not present

## 2014-06-08 DIAGNOSIS — F329 Major depressive disorder, single episode, unspecified: Secondary | ICD-10-CM | POA: Diagnosis not present

## 2014-06-08 DIAGNOSIS — R609 Edema, unspecified: Secondary | ICD-10-CM | POA: Diagnosis not present

## 2014-06-08 DIAGNOSIS — M1990 Unspecified osteoarthritis, unspecified site: Secondary | ICD-10-CM | POA: Diagnosis not present

## 2014-06-08 DIAGNOSIS — R32 Unspecified urinary incontinence: Secondary | ICD-10-CM | POA: Diagnosis not present

## 2014-06-09 ENCOUNTER — Other Ambulatory Visit: Payer: Self-pay | Admitting: *Deleted

## 2014-06-09 DIAGNOSIS — F329 Major depressive disorder, single episode, unspecified: Secondary | ICD-10-CM | POA: Diagnosis not present

## 2014-06-09 DIAGNOSIS — M2550 Pain in unspecified joint: Secondary | ICD-10-CM

## 2014-06-09 NOTE — Telephone Encounter (Signed)
Last refilled 3/15 Last visit 3/31

## 2014-06-10 DIAGNOSIS — F329 Major depressive disorder, single episode, unspecified: Secondary | ICD-10-CM | POA: Diagnosis not present

## 2014-06-11 ENCOUNTER — Encounter (HOSPITAL_COMMUNITY)
Admission: RE | Admit: 2014-06-11 | Discharge: 2014-06-11 | Disposition: A | Discharge status: Home / Self Care | Payer: Medicare Other | Source: Ambulatory Visit | Attending: Cardiology | Admitting: Cardiology

## 2014-06-11 ENCOUNTER — Other Ambulatory Visit: Payer: Self-pay

## 2014-06-11 DIAGNOSIS — F329 Major depressive disorder, single episode, unspecified: Secondary | ICD-10-CM | POA: Diagnosis not present

## 2014-06-11 DIAGNOSIS — I2510 Atherosclerotic heart disease of native coronary artery without angina pectoris: Secondary | ICD-10-CM | POA: Diagnosis not present

## 2014-06-11 DIAGNOSIS — N189 Chronic kidney disease, unspecified: Secondary | ICD-10-CM | POA: Diagnosis not present

## 2014-06-11 DIAGNOSIS — R079 Chest pain, unspecified: Secondary | ICD-10-CM | POA: Insufficient documentation

## 2014-06-11 DIAGNOSIS — I252 Old myocardial infarction: Secondary | ICD-10-CM | POA: Diagnosis not present

## 2014-06-11 DIAGNOSIS — I10 Essential (primary) hypertension: Secondary | ICD-10-CM | POA: Diagnosis not present

## 2014-06-11 LAB — BASIC METABOLIC PANEL
ANION GAP: 9 (ref 5–15)
BUN: 17 mg/dL (ref 6–23)
CO2: 26 mmol/L (ref 19–32)
Calcium: 8.3 mg/dL — ABNORMAL LOW (ref 8.4–10.5)
Chloride: 103 mmol/L (ref 96–112)
Creatinine, Ser: 1.62 mg/dL — ABNORMAL HIGH (ref 0.50–1.10)
GFR calc Af Amer: 39 mL/min — ABNORMAL LOW (ref >90)
GFR calc non Af Amer: 33 mL/min — ABNORMAL LOW (ref >90)
GLUCOSE: 108 mg/dL — AB (ref 70–99)
POTASSIUM: 3.8 mmol/L (ref 3.5–5.1)
Sodium: 138 mmol/L (ref 135–145)

## 2014-06-11 LAB — LIPID PANEL
CHOL/HDL RATIO: 3.4 ratio
Cholesterol: 114 mg/dL (ref 0–200)
HDL: 34 mg/dL — AB (ref >39)
LDL Cholesterol: 62 mg/dL (ref 0–99)
Triglycerides: 89 mg/dL (ref <150)
VLDL: 18 mg/dL (ref 0–40)

## 2014-06-11 LAB — HEPATIC FUNCTION PANEL
ALK PHOS: 133 U/L — AB (ref 39–117)
ALT: 14 U/L (ref 0–35)
AST: 18 U/L (ref 0–37)
Albumin: 3.5 g/dL (ref 3.5–5.2)
BILIRUBIN TOTAL: 0.9 mg/dL (ref 0.3–1.2)
Bilirubin, Direct: <0.1 mg/dL (ref 0.0–0.5)
Total Protein: 7.3 g/dL (ref 6.0–8.3)

## 2014-06-11 MED ORDER — FAMOTIDINE IN NACL 20-0.9 MG/50ML-% IV SOLN
INTRAVENOUS | Status: AC
  Filled 2014-06-11: qty 50

## 2014-06-11 MED ORDER — REGADENOSON 0.4 MG/5ML IV SOLN: 0.4000 mg | Freq: Once | INTRAVENOUS | Status: DC

## 2014-06-11 MED ORDER — DIPHENHYDRAMINE HCL 50 MG/ML IJ SOLN: 25.0000 mg | Freq: Once | INTRAMUSCULAR | Status: DC

## 2014-06-11 MED ORDER — REGADENOSON 0.4 MG/5ML IV SOLN
INTRAVENOUS | Status: AC
  Administered 2014-06-11: 0.4 mg
  Filled 2014-06-11: qty 5

## 2014-06-11 MED ORDER — DIPHENHYDRAMINE HCL 50 MG/ML IJ SOLN
25.0000 mg | Freq: Once | INTRAMUSCULAR | Status: AC
  Administered 2014-06-11: 25 mg via INTRAVENOUS

## 2014-06-11 MED ORDER — FAMOTIDINE IN NACL 20-0.9 MG/50ML-% IV SOLN
20.0000 mg | Freq: Once | INTRAVENOUS | Status: AC
  Administered 2014-06-11: 20 mg via INTRAVENOUS
  Filled 2014-06-11: qty 50

## 2014-06-11 MED ORDER — METHYLPREDNISOLONE SODIUM SUCC 125 MG IJ SOLR: 125.0000 mg | Freq: Once | INTRAMUSCULAR | Status: DC

## 2014-06-11 MED ORDER — OXYCODONE-ACETAMINOPHEN 10-325 MG PO TABS: 1.0000 | ORAL_TABLET | Freq: Three times a day (TID) | ORAL | Status: DC | PRN

## 2014-06-11 MED ORDER — METHYLPREDNISOLONE SODIUM SUCC 125 MG IJ SOLR
125.0000 mg | Freq: Once | INTRAMUSCULAR | Status: AC
  Administered 2014-06-11: 125 mg via INTRAVENOUS

## 2014-06-11 MED ORDER — FAMOTIDINE IN NACL 20-0.9 MG/50ML-% IV SOLN
20.0000 mg | Freq: Once | INTRAVENOUS | Status: DC
  Filled 2014-06-11: qty 50

## 2014-06-11 MED ORDER — TECHNETIUM TC 99M SESTAMIBI GENERIC - CARDIOLITE
30.0000 | Freq: Once | INTRAVENOUS | Status: AC | PRN
  Administered 2014-06-11: 30 via INTRAVENOUS

## 2014-06-11 MED ORDER — METHYLPREDNISOLONE SODIUM SUCC 125 MG IJ SOLR
INTRAMUSCULAR | Status: AC
  Administered 2014-06-11: 125 mg via NASAL
  Filled 2014-06-11: qty 2

## 2014-06-11 MED ORDER — DIPHENHYDRAMINE HCL 50 MG/ML IJ SOLN
INTRAMUSCULAR | Status: AC
  Administered 2014-06-11: 25 mg via INTRAVENOUS
  Filled 2014-06-11: qty 1

## 2014-06-11 MED ORDER — TECHNETIUM TC 99M SESTAMIBI GENERIC - CARDIOLITE
10.0000 | Freq: Once | INTRAVENOUS | Status: AC | PRN
  Administered 2014-06-11: 10 via INTRAVENOUS

## 2014-06-11 NOTE — Progress Notes (Signed)
VS stable, no further itching. No SOB. Dr Sharyn LullHarwani paged to see if patient can be discharged.

## 2014-06-11 NOTE — Progress Notes (Signed)
Spoke with Dr Sharyn LullHarwani re VS stable, itching and rash gone.No resp distress.  Patient can be discharged.

## 2014-06-17 ENCOUNTER — Other Ambulatory Visit: Payer: Self-pay | Admitting: Internal Medicine

## 2014-06-17 DIAGNOSIS — Z79899 Other long term (current) drug therapy: Secondary | ICD-10-CM | POA: Diagnosis not present

## 2014-06-17 DIAGNOSIS — F10229 Alcohol dependence with intoxication, unspecified: Secondary | ICD-10-CM | POA: Diagnosis not present

## 2014-06-17 DIAGNOSIS — Z5181 Encounter for therapeutic drug level monitoring: Secondary | ICD-10-CM | POA: Diagnosis not present

## 2014-06-17 DIAGNOSIS — F333 Major depressive disorder, recurrent, severe with psychotic symptoms: Secondary | ICD-10-CM | POA: Diagnosis not present

## 2014-06-18 NOTE — Telephone Encounter (Signed)
Needs June appt PCP. Or first available.   On both PPI and H2B. Unclear dual need. Fill and forward to PCP.

## 2014-06-28 DIAGNOSIS — F10229 Alcohol dependence with intoxication, unspecified: Secondary | ICD-10-CM | POA: Diagnosis not present

## 2014-06-28 DIAGNOSIS — Z5181 Encounter for therapeutic drug level monitoring: Secondary | ICD-10-CM | POA: Diagnosis not present

## 2014-06-28 DIAGNOSIS — F333 Major depressive disorder, recurrent, severe with psychotic symptoms: Secondary | ICD-10-CM | POA: Diagnosis not present

## 2014-06-28 DIAGNOSIS — Z79899 Other long term (current) drug therapy: Secondary | ICD-10-CM | POA: Diagnosis not present

## 2014-07-06 ENCOUNTER — Other Ambulatory Visit: Payer: Self-pay | Admitting: *Deleted

## 2014-07-06 DIAGNOSIS — M2550 Pain in unspecified joint: Secondary | ICD-10-CM

## 2014-07-06 MED ORDER — OXYCODONE-ACETAMINOPHEN 10-325 MG PO TABS: 1.0000 | ORAL_TABLET | Freq: Three times a day (TID) | ORAL | Status: DC | PRN

## 2014-07-06 NOTE — Telephone Encounter (Signed)
Lm for rtc 

## 2014-07-07 NOTE — Telephone Encounter (Signed)
informed

## 2014-07-07 NOTE — Telephone Encounter (Signed)
Pt called, no answer, lm for rtc

## 2014-07-15 DIAGNOSIS — F333 Major depressive disorder, recurrent, severe with psychotic symptoms: Secondary | ICD-10-CM | POA: Diagnosis not present

## 2014-07-15 DIAGNOSIS — Z79899 Other long term (current) drug therapy: Secondary | ICD-10-CM | POA: Diagnosis not present

## 2014-07-15 DIAGNOSIS — Z5181 Encounter for therapeutic drug level monitoring: Secondary | ICD-10-CM | POA: Diagnosis not present

## 2014-07-15 DIAGNOSIS — F10229 Alcohol dependence with intoxication, unspecified: Secondary | ICD-10-CM | POA: Diagnosis not present

## 2014-07-22 DIAGNOSIS — Z5181 Encounter for therapeutic drug level monitoring: Secondary | ICD-10-CM | POA: Diagnosis not present

## 2014-07-22 DIAGNOSIS — F1920 Other psychoactive substance dependence, uncomplicated: Secondary | ICD-10-CM | POA: Diagnosis not present

## 2014-07-29 DIAGNOSIS — F1920 Other psychoactive substance dependence, uncomplicated: Secondary | ICD-10-CM | POA: Diagnosis not present

## 2014-07-29 DIAGNOSIS — Z5181 Encounter for therapeutic drug level monitoring: Secondary | ICD-10-CM | POA: Diagnosis not present

## 2014-08-02 DIAGNOSIS — R32 Unspecified urinary incontinence: Secondary | ICD-10-CM | POA: Diagnosis not present

## 2014-08-02 DIAGNOSIS — M1990 Unspecified osteoarthritis, unspecified site: Secondary | ICD-10-CM | POA: Diagnosis not present

## 2014-08-02 DIAGNOSIS — J45901 Unspecified asthma with (acute) exacerbation: Secondary | ICD-10-CM | POA: Diagnosis not present

## 2014-08-02 DIAGNOSIS — R609 Edema, unspecified: Secondary | ICD-10-CM | POA: Diagnosis not present

## 2014-08-11 ENCOUNTER — Ambulatory Visit (INDEPENDENT_AMBULATORY_CARE_PROVIDER_SITE_OTHER): Payer: Medicare Other | Admitting: Internal Medicine

## 2014-08-11 ENCOUNTER — Encounter: Payer: Self-pay | Admitting: Internal Medicine

## 2014-08-11 VITALS — BP 141/82 | HR 84 | Temp 98.4°F | Ht 62.0 in | Wt 246.8 lb

## 2014-08-11 DIAGNOSIS — F331 Major depressive disorder, recurrent, moderate: Secondary | ICD-10-CM

## 2014-08-11 DIAGNOSIS — Z0000 Encounter for general adult medical examination without abnormal findings: Secondary | ICD-10-CM

## 2014-08-11 DIAGNOSIS — D649 Anemia, unspecified: Secondary | ICD-10-CM | POA: Diagnosis not present

## 2014-08-11 DIAGNOSIS — I252 Old myocardial infarction: Secondary | ICD-10-CM | POA: Diagnosis not present

## 2014-08-11 DIAGNOSIS — D62 Acute posthemorrhagic anemia: Secondary | ICD-10-CM | POA: Insufficient documentation

## 2014-08-11 DIAGNOSIS — K219 Gastro-esophageal reflux disease without esophagitis: Secondary | ICD-10-CM

## 2014-08-11 DIAGNOSIS — M109 Gout, unspecified: Secondary | ICD-10-CM

## 2014-08-11 DIAGNOSIS — G8929 Other chronic pain: Secondary | ICD-10-CM

## 2014-08-11 DIAGNOSIS — I82503 Chronic embolism and thrombosis of unspecified deep veins of lower extremity, bilateral: Secondary | ICD-10-CM | POA: Diagnosis not present

## 2014-08-11 DIAGNOSIS — N183 Chronic kidney disease, stage 3 unspecified: Secondary | ICD-10-CM

## 2014-08-11 DIAGNOSIS — M25561 Pain in right knee: Secondary | ICD-10-CM

## 2014-08-11 DIAGNOSIS — M25562 Pain in left knee: Secondary | ICD-10-CM

## 2014-08-11 DIAGNOSIS — M549 Dorsalgia, unspecified: Secondary | ICD-10-CM

## 2014-08-11 DIAGNOSIS — I82403 Acute embolism and thrombosis of unspecified deep veins of lower extremity, bilateral: Secondary | ICD-10-CM

## 2014-08-11 DIAGNOSIS — I10 Essential (primary) hypertension: Secondary | ICD-10-CM

## 2014-08-11 DIAGNOSIS — Z7901 Long term (current) use of anticoagulants: Secondary | ICD-10-CM

## 2014-08-11 DIAGNOSIS — M2550 Pain in unspecified joint: Secondary | ICD-10-CM

## 2014-08-11 DIAGNOSIS — I2510 Atherosclerotic heart disease of native coronary artery without angina pectoris: Secondary | ICD-10-CM | POA: Diagnosis not present

## 2014-08-11 LAB — BASIC METABOLIC PANEL WITH GFR
BUN: 22 mg/dL (ref 6–23)
CO2: 22 meq/L (ref 19–32)
Calcium: 8.6 mg/dL (ref 8.4–10.5)
Chloride: 101 mEq/L (ref 96–112)
Creat: 1.66 mg/dL — ABNORMAL HIGH (ref 0.50–1.10)
GFR, EST AFRICAN AMERICAN: 38 mL/min — AB
GFR, Est Non African American: 33 mL/min — ABNORMAL LOW
Glucose, Bld: 113 mg/dL — ABNORMAL HIGH (ref 70–99)
Potassium: 3.9 mEq/L (ref 3.5–5.3)
Sodium: 134 mEq/L — ABNORMAL LOW (ref 135–145)

## 2014-08-11 LAB — IRON AND TIBC
%SAT: 30 % (ref 20–55)
IRON: 68 ug/dL (ref 42–145)
TIBC: 223 ug/dL — ABNORMAL LOW (ref 250–470)
UIBC: 155 ug/dL (ref 125–400)

## 2014-08-11 LAB — CBC
HCT: 34.7 % — ABNORMAL LOW (ref 36.0–46.0)
HEMOGLOBIN: 11.7 g/dL — AB (ref 12.0–15.0)
MCH: 30.9 pg (ref 26.0–34.0)
MCHC: 33.7 g/dL (ref 30.0–36.0)
MCV: 91.6 fL (ref 78.0–100.0)
MPV: 10.5 fL (ref 8.6–12.4)
Platelets: 237 10*3/uL (ref 150–400)
RBC: 3.79 MIL/uL — AB (ref 3.87–5.11)
RDW: 14.4 % (ref 11.5–15.5)
WBC: 4.5 10*3/uL (ref 4.0–10.5)

## 2014-08-11 LAB — FERRITIN: Ferritin: 177 ng/mL (ref 10–291)

## 2014-08-11 MED ORDER — OXYCODONE-ACETAMINOPHEN 10-325 MG PO TABS: 1.0000 | ORAL_TABLET | Freq: Three times a day (TID) | ORAL | Status: DC | PRN

## 2014-08-11 MED ORDER — FAMOTIDINE 20 MG PO TABS: 20.0000 mg | ORAL_TABLET | Freq: Two times a day (BID) | ORAL | Status: DC

## 2014-08-11 MED ORDER — BUPROPION HCL ER (XL) 150 MG PO TB24: 150.0000 mg | ORAL_TABLET | ORAL | Status: DC

## 2014-08-11 MED ORDER — FUROSEMIDE 20 MG PO TABS: 20.0000 mg | ORAL_TABLET | Freq: Every day | ORAL | Status: DC

## 2014-08-11 NOTE — Assessment & Plan Note (Signed)
>>  ASSESSMENT AND PLAN FOR CORONARY ATHEROSCLEROSIS WRITTEN ON 08/11/2014  3:06 PM BY Tynslee Bowlds B, MD  She has no symptoms of Chest pain today.  She has no issues with her breathing.  Appears stable.  She is on losartan  and metoprolol .  She had an EKG in 2014 which was unchanged from previous, showing low voltage and likely atrial abnormality.  She has a history of NSTEMI's in 2004, 2005.  TTE in 2014 showed mild LVH only.

## 2014-08-11 NOTE — Assessment & Plan Note (Signed)
She reports that she used to be on allopurinol.  I do not find that her physical exam was consistent with gout.  She will call back if symptoms progress or worsen.  She has well controlled symptoms with her Pain medications.

## 2014-08-11 NOTE — Assessment & Plan Note (Signed)
I am concerned her swelling may be related to worsening of her kidney function, however, venous insufficiency is also a likely cause.  Will d/c chlorthalidone in favor of low dose lasix.  She was previously on this medication in 2013.  She does have concomitant urinary incontinence, however, she is well controlled with Toviaz.  If she is not able to tolerate lasix due to this issue, will need to readdress.  I have asked her to follow up with her nephrologist this summer.  Check BMET.

## 2014-08-11 NOTE — Assessment & Plan Note (Signed)
She has no symptoms of Chest pain today.  She has no issues with her breathing.  Appears stable.  She is on losartan and metoprolol.  She had an EKG in 2014 which was unchanged from previous, showing low voltage and likely atrial abnormality.  She has a history of NSTEMI's in 2004, 2005.  TTE in 2014 showed mild LVH only.

## 2014-08-11 NOTE — Assessment & Plan Note (Signed)
She has multiple chronic joint pains including knees and back.  The pain is well controlled on her current dose of Percocet.  She has also had relief of new leg pain with the medication.  She is able to be more active after the medication was started (used to be confined to using a walker).  Continue current dose of Percocet, 10/325 #90.

## 2014-08-11 NOTE — Assessment & Plan Note (Signed)
She has been out of her famotidine.  She reports good symptom relief with this medication.  I discontinued her Rx for omeprazole.

## 2014-08-11 NOTE — Progress Notes (Signed)
Subjective:    Patient ID: Vanessa Palmer, female    DOB: [birth year], 64 y.o.   MRN: [id number]  3 month follow up for HTN, CAD, DVT  HPI  Ms. Richwine is a 64yo woman with PMH of asthma, CAD, OA, DVT, CKD, HTN, gout, HLD, Depression, pre-DM, urinary incontinence who presents for routine follow up.    Ms. Fess presents with her granddaughter in room (62 years old).  Ms Pelly reports that she has been experiencing an increase in LE swelling in the last 2 weeks.  This is bilateral, seems to be worse on the right.  She notes that her legs are "flat" in the morning after lying flat overnight, but that they get puffy, worse at the ankles, by the end of the day.  She does have some soreness on the anterior surface of her right leg.  No tenderness or palpable cords on the calf.  She is taking her xarelto for her DVT of the leg.  She has the pill bottle with her today.   She has a history of CKD and HTN.  She is not currently on lasix, but is on chlorthalidone for BP.  She has a history of gout and is worried that the pain could be related to gout.  She has previously been on allopurinol, unclear why this was stopped per her.  She takes her Vicoden which helps with the pain significantly.   She further notes that she is out of her buproprion and famotidine which she would like to have refilled.  We discussed FOBT cards for, as she is due for colon cancer screening.   Further issues include HTN for which she takes amlodipine, chlorthalidone, losartan, metoprolol.  BP is well controlled today.   She has OSA but does not use a CPAP.  She has HLD and is on atorvastatin.   She brought in all the meds she was taking, these did not include buproprion and famotidine, these wre refilled.  Otherwise, her medications were reconciled.      Medication List       This list is accurate as of: 08/11/14  3:00 PM.  Always use your most recent med list.               acetaminophen 500 MG tablet  Commonly  known as:  TYLENOL  Take 1,000 mg by mouth every 6 (six) hours as needed for moderate pain.     amLODipine 10 MG tablet  Commonly known as:  NORVASC  TAKE 1 TABLET (10 MG TOTAL) BY MOUTH DAILY.     atorvastatin 40 MG tablet  Commonly known as:  LIPITOR  TAKE 1 TABLET (40 MG TOTAL) BY MOUTH DAILY.     b complex vitamins tablet  Take 1 tablet by mouth daily.     buPROPion 150 MG 24 hr tablet  Commonly known as:  WELLBUTRIN XL  Take 1 tablet (150 mg total) by mouth every morning.     COLD AND FLU PO  Take 30 mLs by mouth daily as needed (for cold).     cyclobenzaprine 10 MG tablet  Commonly known as:  FLEXERIL  Take 0.5 tablets (5 mg total) by mouth 2 (two) times daily as needed for muscle spasms.     famotidine 20 MG tablet  Commonly known as:  PEPCID  Take 1 tablet (20 mg total) by mouth 2 (two) times daily.     fluticasone 44 MCG/ACT inhaler  Commonly known as:  FLOVENT  HFA  Inhale 2 puffs into the lungs 2 (two) times daily.     fluticasone 50 MCG/ACT nasal spray  Commonly known as:  FLONASE  INSTILL TWO   SPRAYS IN EACH NOSTRIL ONCE DAILY     furosemide 20 MG tablet  Commonly known as:  LASIX  Take 1 tablet (20 mg total) by mouth daily.     hydrOXYzine 25 MG tablet  Commonly known as:  ATARAX/VISTARIL  Take 1 tablet (25 mg total) by mouth every 6 (six) hours as needed for itching.     losartan 100 MG tablet  Commonly known as:  COZAAR  TAKE 1 TABLET (100 MG TOTAL) BY MOUTH TWO   TIMES DAILY.     metoprolol 50 MG tablet  Commonly known as:  LOPRESSOR  Take 1 tablet (50 mg total) by mouth daily.     nitroGLYCERIN 0.4 MG SL tablet  Commonly known as:  NITROSTAT  Place 0.4 mg under the tongue every 5 (five) minutes as needed for chest pain.     oxyCODONE-acetaminophen 10-325 MG per tablet  Commonly known as:  PERCOCET  Take 1 tablet by mouth every 8 (eight) hours as needed for pain.     rivaroxaban 20 MG Tabs tablet  Commonly known as:  XARELTO  TAKE 1  TABLET (20 MG TOTAL) BY MOUTH DAILY WITH SUPPER.     TOVIAZ 8 MG Tb24 tablet  Generic drug:  fesoterodine  Take 8 mg by mouth Daily.     traZODone 50 MG tablet  Commonly known as:  DESYREL  TAKE 0.5 TABLETS (25 MG TOTAL) BY MOUTH AT BEDTIME.        Review of Systems  Constitutional: Negative for fever, chills and fatigue.  HENT: Negative for ear discharge and ear pain.   Eyes: Negative for photophobia and visual disturbance.  Respiratory: Negative for cough, choking and shortness of breath.   Cardiovascular: Positive for leg swelling (R>L). Negative for chest pain.  Genitourinary: Positive for enuresis. Negative for dysuria, frequency and difficulty urinating.  Musculoskeletal: Positive for back pain (chronic).  Skin: Negative for rash and wound.  Neurological: Negative for dizziness, weakness and light-headedness.  Psychiatric/Behavioral: Negative for confusion and decreased concentration.       Objective:   Physical Exam  Constitutional: She is oriented to person, place, and time. She appears well-developed and well-nourished. No distress.  HENT:  Head: Atraumatic.  Eyes: Conjunctivae are normal. No scleral icterus.  Neck: Neck supple.  Cardiovascular: Normal rate, regular rhythm and normal heart sounds.   No murmur heard. Pulmonary/Chest: Effort normal and breath sounds normal. No respiratory distress. She has no wheezes.  Abdominal: Soft. Bowel sounds are normal. She exhibits no distension.  Musculoskeletal: She exhibits edema (She has 1+ edema to ankle on the right, trace on the left to ankles.  She is tender over her  tibia anteriorly on the right, no tenderness on the left,  No calf tenderness or erythema).  Neurological: She is alert and oriented to person, place, and time.  Skin: Skin is warm and dry. No erythema.  Psychiatric: She has a normal mood and affect. Her behavior is normal.    BMET, CBC, Iron panel with ferritin today.   FOBT cards provided today.        Assessment & Plan:  RTC in 3 months.

## 2014-08-11 NOTE — Assessment & Plan Note (Signed)
She is on buproprion for this and is well controlled today.  She is adamant about staying on this medication.

## 2014-08-11 NOTE — Assessment & Plan Note (Signed)
She is taking xarelto.  Her clinical exam does not seem consistent with a new DVT while on anticoagulation.  Pulses were intact in bilateral LE.  Continue xarelto.

## 2014-08-11 NOTE — Assessment & Plan Note (Signed)
Recheck CBC today.  Check iron panel and ferritin.  Likely related to CKD.

## 2014-08-11 NOTE — Patient Instructions (Addendum)
General Instructions: Please schedule a follow up visit within the next 2-3 months.   For your medications:   Please bring all of your pill  Bottles with you to each visit.  This will help make sure that we have an up to date list of all the medications you are taking.  Please also bring any over the counter herbal medications you are taking (not including advil, tylenol, etc.)  Please continue taking Most of your medications as prescribed (please see updated medication list)  Please start taking Lasix 20mg  daily for your swelling.  More information about this medication is below.  Please STOP taking chlorthalidone.    Please call your kidney doctor and make an appointment for the summer.  You are also due for a mammogram.    I will call you with lab results.   Please return stool cards at your earliest convenience.  Thank you!    Treatment Goals:  Goals (1 Years of Data) as of 08/11/14          As of Today 06/11/14 06/11/14 06/11/14 06/11/14     Blood Pressure   . Blood Pressure < 140/90  141/82 112/80 106/69 108/69 108/69      Progress Toward Treatment Goals:  Treatment Goal 08/11/2014  Blood pressure at goal  Prevent falls at goal    Self Care Goals & Plans:  Self Care Goal 05/27/2014  Manage my medications take my medicines as prescribed; bring my medications to every visit; refill my medications on time  Monitor my health -  Eat healthy foods eat more vegetables; eat foods that are low in salt; eat baked foods instead of fried foods  Be physically active -  Prevent falls -  Meeting treatment goals -    No flowsheet data found.   Care Management & Community Referrals:  Referral 06/02/2013  Referrals made for care management support none needed  Referrals made to community resources none     Furosemide tablets What is this medicine? FUROSEMIDE (fyoor [place] se mide) is a diuretic. It helps you make more urine and to lose salt and excess water from your body. This  medicine is used to treat high blood pressure, and edema or swelling from heart, kidney, or liver disease. This medicine may be used for other purposes; ask your health care provider or pharmacist if you have questions. COMMON BRAND NAME(S): Delone, Lasix What should I tell my health care provider before I take this medicine? They need to know if you have any of these conditions: -abnormal blood electrolytes -diarrhea or vomiting -gout -heart disease -kidney disease, small amounts of urine, or difficulty passing urine -liver disease -an unusual or allergic reaction to furosemide, sulfa drugs, other medicines, foods, dyes, or preservatives -pregnant or trying to get pregnant -breast-feeding How should I use this medicine? Take this medicine by mouth with a glass of water. Follow the directions on the prescription label. You may take this medicine with or without food. If it upsets your stomach, take it with food or milk. Do not take your medicine more often than directed. Remember that you will need to pass more urine after taking this medicine. Do not take your medicine at a time of day that will cause you problems. Do not take at bedtime. Talk to your pediatrician regarding the use of this medicine in children. While this drug may be prescribed for selected conditions, precautions do apply. Overdosage: If you think you have taken too much of this medicine  contact a poison control center or emergency room at once. NOTE: This medicine is only for you. Do not share this medicine with others. What if I miss a dose? If you miss a dose, take it as soon as you can. If it is almost time for your next dose, take only that dose. Do not take double or extra doses. What may interact with this medicine? -aspirin and aspirin-like medicines -certain antibiotics -chloral hydrate -cisplatin -cyclosporine -digoxin -diuretics -laxatives -lithium -medicines for blood pressure -medicines that relax  muscles for surgery -methotrexate -NSAIDs, medicines for pain and inflammation like ibuprofen, naproxen, or indomethacin -phenytoin -steroid medicines like prednisone or cortisone -sucralfate This list may not describe all possible interactions. Give your health care provider a list of all the medicines, herbs, non-prescription drugs, or dietary supplements you use. Also tell them if you smoke, drink alcohol, or use illegal drugs. Some items may interact with your medicine. What should I watch for while using this medicine? Visit your doctor or health care professional for regular checks on your progress. Check your blood pressure regularly. Ask your doctor or health care professional what your blood pressure should be, and when you should contact him or her. If you are a diabetic, check your blood sugar as directed. You may need to be on a special diet while taking this medicine. Check with your doctor. Also, ask how many glasses of fluid you need to drink a day. You must not get dehydrated. You may get drowsy or dizzy. Do not drive, use machinery, or do anything that needs mental alertness until you know how this drug affects you. Do not stand or sit up quickly, especially if you are an older patient. This reduces the risk of dizzy or fainting spells. Alcohol can make you more drowsy and dizzy. Avoid alcoholic drinks. This medicine can make you more sensitive to the sun. Keep out of the sun. If you cannot avoid being in the sun, wear protective clothing and use sunscreen. Do not use sun lamps or tanning beds/booths. What side effects may I notice from receiving this medicine? Side effects that you should report to your doctor or health care professional as soon as possible: -blood in urine or stools -dry mouth -fever or chills -hearing loss or ringing in the ears -irregular heartbeat -muscle pain or weakness, cramps -skin rash -stomach upset, pain, or nausea -tingling or numbness in the hands  or feet -unusually weak or tired -vomiting or diarrhea -yellowing of the eyes or skin Side effects that usually do not require medical attention (report to your doctor or health care professional if they continue or are bothersome): -headache -loss of appetite -unusual bleeding or bruising This list may not describe all possible side effects. Call your doctor for medical advice about side effects. You may report side effects to FDA at 1-800-FDA-1088. Where should I keep my medicine? Keep out of the reach of children. Store at room temperature between 15 and 30 degrees C (59 and 86 degrees F). Protect from light. Throw away any unused medicine after the expiration date. NOTE: This sheet is a summary. It may not cover all possible information. If you have questions about this medicine, talk to your doctor, pharmacist, or health care provider.  2015, [employer]. (2009-01-31 16:24:50)

## 2014-08-12 DIAGNOSIS — F1920 Other psychoactive substance dependence, uncomplicated: Secondary | ICD-10-CM | POA: Diagnosis not present

## 2014-08-12 DIAGNOSIS — Z5181 Encounter for therapeutic drug level monitoring: Secondary | ICD-10-CM | POA: Diagnosis not present

## 2014-08-18 DIAGNOSIS — Z5181 Encounter for therapeutic drug level monitoring: Secondary | ICD-10-CM | POA: Diagnosis not present

## 2014-08-18 DIAGNOSIS — F1920 Other psychoactive substance dependence, uncomplicated: Secondary | ICD-10-CM | POA: Diagnosis not present

## 2014-08-20 ENCOUNTER — Other Ambulatory Visit: Payer: Self-pay | Admitting: Internal Medicine

## 2014-08-23 DIAGNOSIS — F1920 Other psychoactive substance dependence, uncomplicated: Secondary | ICD-10-CM | POA: Diagnosis not present

## 2014-08-23 DIAGNOSIS — Z5181 Encounter for therapeutic drug level monitoring: Secondary | ICD-10-CM | POA: Diagnosis not present

## 2014-09-02 ENCOUNTER — Encounter (HOSPITAL_COMMUNITY): Payer: Self-pay | Admitting: *Deleted

## 2014-09-02 ENCOUNTER — Emergency Department (HOSPITAL_COMMUNITY): Payer: Medicare Other

## 2014-09-02 ENCOUNTER — Emergency Department (HOSPITAL_COMMUNITY)
Admission: EM | Admit: 2014-09-02 | Discharge: 2014-09-02 | Disposition: A | Discharge status: Home / Self Care | Payer: Medicare Other | Attending: Emergency Medicine | Admitting: Emergency Medicine

## 2014-09-02 DIAGNOSIS — I129 Hypertensive chronic kidney disease with stage 1 through stage 4 chronic kidney disease, or unspecified chronic kidney disease: Secondary | ICD-10-CM | POA: Diagnosis not present

## 2014-09-02 DIAGNOSIS — R110 Nausea: Secondary | ICD-10-CM | POA: Insufficient documentation

## 2014-09-02 DIAGNOSIS — I509 Heart failure, unspecified: Secondary | ICD-10-CM | POA: Diagnosis not present

## 2014-09-02 DIAGNOSIS — R197 Diarrhea, unspecified: Secondary | ICD-10-CM | POA: Insufficient documentation

## 2014-09-02 DIAGNOSIS — Z79899 Other long term (current) drug therapy: Secondary | ICD-10-CM | POA: Diagnosis not present

## 2014-09-02 DIAGNOSIS — R609 Edema, unspecified: Secondary | ICD-10-CM | POA: Diagnosis not present

## 2014-09-02 DIAGNOSIS — Z86718 Personal history of other venous thrombosis and embolism: Secondary | ICD-10-CM | POA: Insufficient documentation

## 2014-09-02 DIAGNOSIS — E669 Obesity, unspecified: Secondary | ICD-10-CM | POA: Diagnosis not present

## 2014-09-02 DIAGNOSIS — R1084 Generalized abdominal pain: Secondary | ICD-10-CM | POA: Diagnosis not present

## 2014-09-02 DIAGNOSIS — M1990 Unspecified osteoarthritis, unspecified site: Secondary | ICD-10-CM | POA: Diagnosis not present

## 2014-09-02 DIAGNOSIS — Z9981 Dependence on supplemental oxygen: Secondary | ICD-10-CM | POA: Diagnosis not present

## 2014-09-02 DIAGNOSIS — M109 Gout, unspecified: Secondary | ICD-10-CM | POA: Diagnosis not present

## 2014-09-02 DIAGNOSIS — F329 Major depressive disorder, single episode, unspecified: Secondary | ICD-10-CM | POA: Insufficient documentation

## 2014-09-02 DIAGNOSIS — N183 Chronic kidney disease, stage 3 (moderate): Secondary | ICD-10-CM | POA: Diagnosis not present

## 2014-09-02 DIAGNOSIS — G4733 Obstructive sleep apnea (adult) (pediatric): Secondary | ICD-10-CM | POA: Insufficient documentation

## 2014-09-02 DIAGNOSIS — R630 Anorexia: Secondary | ICD-10-CM | POA: Diagnosis not present

## 2014-09-02 DIAGNOSIS — K2970 Gastritis, unspecified, without bleeding: Secondary | ICD-10-CM | POA: Diagnosis not present

## 2014-09-02 DIAGNOSIS — R32 Unspecified urinary incontinence: Secondary | ICD-10-CM | POA: Diagnosis not present

## 2014-09-02 DIAGNOSIS — E785 Hyperlipidemia, unspecified: Secondary | ICD-10-CM | POA: Insufficient documentation

## 2014-09-02 DIAGNOSIS — K5730 Diverticulosis of large intestine without perforation or abscess without bleeding: Secondary | ICD-10-CM | POA: Diagnosis not present

## 2014-09-02 DIAGNOSIS — Z7951 Long term (current) use of inhaled steroids: Secondary | ICD-10-CM | POA: Insufficient documentation

## 2014-09-02 DIAGNOSIS — I2510 Atherosclerotic heart disease of native coronary artery without angina pectoris: Secondary | ICD-10-CM | POA: Insufficient documentation

## 2014-09-02 DIAGNOSIS — J45901 Unspecified asthma with (acute) exacerbation: Secondary | ICD-10-CM | POA: Diagnosis not present

## 2014-09-02 LAB — CBC WITH DIFFERENTIAL/PLATELET
Basophils Absolute: 0 10*3/uL (ref 0.0–0.1)
Basophils Relative: 1 % (ref 0–1)
EOS PCT: 1 % (ref 0–5)
Eosinophils Absolute: 0.1 10*3/uL (ref 0.0–0.7)
HCT: 35.4 % — ABNORMAL LOW (ref 36.0–46.0)
Hemoglobin: 12.1 g/dL (ref 12.0–15.0)
LYMPHS PCT: 41 % (ref 12–46)
Lymphs Abs: 1.7 10*3/uL (ref 0.7–4.0)
MCH: 31.1 pg (ref 26.0–34.0)
MCHC: 34.2 g/dL (ref 30.0–36.0)
MCV: 91 fL (ref 78.0–100.0)
MONO ABS: 0.3 10*3/uL (ref 0.1–1.0)
MONOS PCT: 6 % (ref 3–12)
NEUTROS ABS: 2.2 10*3/uL (ref 1.7–7.7)
Neutrophils Relative %: 51 % (ref 43–77)
PLATELETS: 192 10*3/uL (ref 150–400)
RBC: 3.89 MIL/uL (ref 3.87–5.11)
RDW: 12.7 % (ref 11.5–15.5)
WBC: 4.2 10*3/uL (ref 4.0–10.5)

## 2014-09-02 LAB — URINALYSIS, ROUTINE W REFLEX MICROSCOPIC
Bilirubin Urine: NEGATIVE
GLUCOSE, UA: NEGATIVE mg/dL
HGB URINE DIPSTICK: NEGATIVE
Ketones, ur: NEGATIVE mg/dL
LEUKOCYTES UA: NEGATIVE
Nitrite: NEGATIVE
Protein, ur: NEGATIVE mg/dL
SPECIFIC GRAVITY, URINE: 1.008 (ref 1.005–1.030)
Urobilinogen, UA: 1 mg/dL (ref 0.0–1.0)
pH: 7.5 (ref 5.0–8.0)

## 2014-09-02 LAB — TROPONIN I: Troponin I: <0.03 ng/mL (ref <0.031)

## 2014-09-02 LAB — COMPREHENSIVE METABOLIC PANEL
ALT: 10 U/L — ABNORMAL LOW (ref 14–54)
AST: 19 U/L (ref 15–41)
Albumin: 3.5 g/dL (ref 3.5–5.0)
Alkaline Phosphatase: 103 U/L (ref 38–126)
Anion gap: 9 (ref 5–15)
BILIRUBIN TOTAL: 0.8 mg/dL (ref 0.3–1.2)
BUN: 13 mg/dL (ref 6–20)
CHLORIDE: 103 mmol/L (ref 101–111)
CO2: 24 mmol/L (ref 22–32)
CREATININE: 1.5 mg/dL — AB (ref 0.44–1.00)
Calcium: 8.9 mg/dL (ref 8.9–10.3)
GFR, EST AFRICAN AMERICAN: 42 mL/min — AB (ref >60)
GFR, EST NON AFRICAN AMERICAN: 36 mL/min — AB (ref >60)
Glucose, Bld: 96 mg/dL (ref 65–99)
POTASSIUM: 3.8 mmol/L (ref 3.5–5.1)
Sodium: 136 mmol/L (ref 135–145)
Total Protein: 7.9 g/dL (ref 6.5–8.1)

## 2014-09-02 LAB — LIPASE, BLOOD: Lipase: 30 U/L (ref 22–51)

## 2014-09-02 MED ORDER — IOHEXOL 300 MG/ML  SOLN
25.0000 mL | Freq: Once | INTRAMUSCULAR | Status: AC | PRN
  Administered 2014-09-02: 25 mL via ORAL

## 2014-09-02 MED ORDER — OXYCODONE-ACETAMINOPHEN 5-325 MG PO TABS
2.0000 | ORAL_TABLET | Freq: Once | ORAL | Status: AC
  Administered 2014-09-02: 2 via ORAL
  Filled 2014-09-02: qty 2

## 2014-09-02 MED ORDER — ONDANSETRON HCL 4 MG PO TABS: 4.0000 mg | ORAL_TABLET | Freq: Four times a day (QID) | ORAL | Status: DC

## 2014-09-02 MED ORDER — FENTANYL CITRATE (PF) 100 MCG/2ML IJ SOLN
100.0000 ug | Freq: Once | INTRAMUSCULAR | Status: AC
  Administered 2014-09-02: 100 ug via INTRAVENOUS
  Filled 2014-09-02: qty 2

## 2014-09-02 MED ORDER — ONDANSETRON HCL 4 MG/2ML IJ SOLN
4.0000 mg | Freq: Once | INTRAMUSCULAR | Status: AC
  Administered 2014-09-02: 4 mg via INTRAVENOUS
  Filled 2014-09-02: qty 2

## 2014-09-02 MED ORDER — DIPHENHYDRAMINE HCL 25 MG PO CAPS
25.0000 mg | ORAL_CAPSULE | Freq: Once | ORAL | Status: AC
  Administered 2014-09-02: 25 mg via ORAL
  Filled 2014-09-02: qty 1

## 2014-09-02 MED ORDER — IOHEXOL 300 MG/ML  SOLN
100.0000 mL | Freq: Once | INTRAMUSCULAR | Status: AC | PRN
  Administered 2014-09-02: 100 mL via INTRAVENOUS

## 2014-09-02 MED ORDER — DIPHENHYDRAMINE HCL 50 MG/ML IJ SOLN
25.0000 mg | Freq: Once | INTRAMUSCULAR | Status: AC
  Administered 2014-09-02: 25 mg via INTRAVENOUS
  Filled 2014-09-02: qty 1

## 2014-09-02 NOTE — ED Provider Notes (Signed)
CSN: [id number]     Arrival date & time 09/02/14  1636 History   First MD Initiated Contact with Patient 09/02/14 1737     Chief Complaint  Patient presents with  . Abdominal Pain     (Consider location/radiation/quality/duration/timing/severity/associated sxs/prior Treatment) Patient is a 64 y.o. female presenting with abdominal pain. The history is provided by the patient. No language interpreter was used.  Abdominal Pain Pain location:  Generalized Pain quality: aching   Pain radiates to:  Does not radiate Duration:  15 hours Timing:  Constant Progression:  Unchanged Chronicity:  New Relieved by:  Nothing Worsened by:  Nothing tried Ineffective treatments:  None tried Associated symptoms: anorexia, diarrhea, fever (subjective) and nausea   Associated symptoms: no chest pain, no chills, no cough, no dysuria, no fatigue, no shortness of breath, no sore throat and no vomiting   Diarrhea:    Quality:  Mucous and watery   Number of occurrences:  10-15   Severity:  Moderate   Duration:  15 hours   Timing:  Intermittent   Progression:  Unchanged Fever:    Duration:  15 hours   Timing:  Intermittent   Temp source:  Subjective Risk factors: obesity   Risk factors: has not had multiple surgeries, no NSAID use, not pregnant and no recent hospitalization     Past Medical History  Diagnosis Date  . Hyperlipidemia   . Hypertension   . Gout   . Obesity   . Depression   . DJD (degenerative joint disease)   . OSA (obstructive sleep apnea)     previously used CPAP, has lost  . CAD (coronary artery disease)     s/p non-Q wave MI in 06/2001 and 2004, 2005  . ALCOHOL ABUSE 12/13/2005    Annotation: Sober since 11/06 Qualifier: Diagnosis of  By: Wallace Cullens MD, Natalia Leatherwood    . INTRINSIC ASTHMA, WITH EXACERBATION 09/27/2009    Qualifier: Diagnosis of  By: Denton Meek MD, Tillie Rung    . Chronic kidney disease (CKD), stage III (moderate) 09/19/2010  . CHF (congestive heart failure)   . DVT of lower  extremity, bilateral 04/04/2012    On Xarelto     Past Surgical History  Procedure Laterality Date  . Partial hysterectomy      due to endometriosis  . Tubal ligation    . Cardiac catheterization      06/2001, 02/2002, 10/2004 (10-15% proximal stenosis of circumflex, diffuse disease of  OM1 and RCA)   Family History  Problem Relation Age of Onset  . Mental illness Mother   . Heart disease Father   . Diabetes Sister   . Diabetes Sister   . Diabetes Sister    History  Substance Use Topics  . Smoking status: Never Smoker   . Smokeless tobacco: Not on file  . Alcohol Use: No   OB History    No data available     Review of Systems  Constitutional: Positive for fever (subjective). Negative for chills, diaphoresis, activity change, appetite change and fatigue.  HENT: Negative for congestion, facial swelling, rhinorrhea and sore throat.   Eyes: Negative for photophobia and discharge.  Respiratory: Negative for cough, chest tightness and shortness of breath.   Cardiovascular: Negative for chest pain, palpitations and leg swelling.  Gastrointestinal: Positive for nausea, abdominal pain, diarrhea and anorexia. Negative for vomiting.  Endocrine: Negative for polydipsia and polyuria.  Genitourinary: Negative for dysuria, frequency, difficulty urinating and pelvic pain.  Musculoskeletal: Negative for back pain, arthralgias, neck pain  and neck stiffness.  Skin: Negative for color change and wound.  Allergic/Immunologic: Negative for immunocompromised state.  Neurological: Negative for facial asymmetry, weakness, numbness and headaches.  Hematological: Does not bruise/bleed easily.  Psychiatric/Behavioral: Negative for confusion and agitation.      Allergies  Ace inhibitors; Lexiscan; Peanuts; Hydromorphone hcl; Percocet; Propoxyphene n-acetaminophen; and Vicodin  Home Medications   Prior to Admission medications   Medication Sig Start Date End Date Taking? Authorizing Provider   acetaminophen (TYLENOL) 500 MG tablet Take 1,000 mg by mouth every 6 (six) hours as needed for moderate pain.   Yes Historical Provider, MD  amLODipine (NORVASC) 10 MG tablet TAKE 1 TABLET (10 MG TOTAL) BY MOUTH DAILY. 08/21/14  Yes Inez CatalinaEmily B Mullen, MD  atorvastatin (LIPITOR) 40 MG tablet TAKE 1 TABLET (40 MG TOTAL) BY MOUTH DAILY. 08/21/14  Yes Inez CatalinaEmily B Mullen, MD  buPROPion (WELLBUTRIN XL) 150 MG 24 hr tablet Take 1 tablet (150 mg total) by mouth every morning. 08/11/14  Yes Inez CatalinaEmily B Mullen, MD  cyclobenzaprine (FLEXERIL) 10 MG tablet Take 0.5 tablets (5 mg total) by mouth 2 (two) times daily as needed for muscle spasms. 03/03/13  Yes Purvis SheffieldForrest Harrison, MD  famotidine (PEPCID) 20 MG tablet Take 1 tablet (20 mg total) by mouth 2 (two) times daily. 08/11/14  Yes Inez CatalinaEmily B Mullen, MD  fluticasone [facility](FLONASE) 50 MCG/ACT nasal spray INSTILL TWO   SPRAYS IN EACH NOSTRIL ONCE DAILY 06/18/14  Yes Burns SpainElizabeth A Butcher, MD  furosemide (LASIX) 20 MG tablet Take 1 tablet (20 mg total) by mouth daily. 08/11/14  Yes Inez CatalinaEmily B Mullen, MD  hydrOXYzine (ATARAX/VISTARIL) 25 MG tablet Take 1 tablet (25 mg total) by mouth every 6 (six) hours as needed for itching. 05/18/14  Yes Loren Raceravid Yelverton, MD  losartan (COZAAR) 100 MG tablet TAKE 1 TABLET (100 MG TOTAL) BY MOUTH TWO   TIMES DAILY. Patient taking differently: TAKE 1 TABLET (100 MG TOTAL) BY MOUTH DAILY 03/02/14  Yes Inez CatalinaEmily B Mullen, MD  metoprolol (LOPRESSOR) 50 MG tablet Take 1 tablet (50 mg total) by mouth daily. 05/12/14 05/12/15 Yes Inez CatalinaEmily B Mullen, MD  Nutritional Supplements (COLD AND FLU PO) Take 30 mLs by mouth daily as needed (for cold).   Yes Historical Provider, MD  oxyCODONE-acetaminophen (PERCOCET) 10-325 MG per tablet Take 1 tablet by mouth every 8 (eight) hours as needed for pain. 08/11/14 08/01/15 Yes Inez CatalinaEmily B Mullen, MD  rivaroxaban (XARELTO) 20 MG TABS tablet TAKE 1 TABLET (20 MG TOTAL) BY MOUTH DAILY WITH SUPPER. 05/05/14  Yes Inez CatalinaEmily B Mullen, MD  TOVIAZ 8 MG TB24 Take 8 mg by  mouth Daily. 11/01/11  Yes Historical Provider, MD  traZODone (DESYREL) 50 MG tablet TAKE 0.5 TABLETS (25 MG TOTAL) BY MOUTH AT BEDTIME. 06/18/14  Yes Burns SpainElizabeth A Butcher, MD  b complex vitamins tablet Take 1 tablet by mouth daily. Patient not taking: Reported on 05/18/2014 01/06/14   Inez CatalinaEmily B Mullen, MD  fluticasone [facility](FLOVENT HFA) 44 MCG/ACT inhaler Inhale 2 puffs into the lungs 2 (two) times daily. Patient not taking: Reported on 05/18/2014 06/02/13   Annett Gularacy N McLean, MD  nitroGLYCERIN (NITROSTAT) 0.4 MG SL tablet Place 0.4 mg under the tongue every 5 (five) minutes as needed for chest pain.    Historical Provider, MD  ondansetron (ZOFRAN) 4 MG tablet Take 1 tablet (4 mg total) by mouth every 6 (six) hours. 09/02/14   Toy CookeyMegan Docherty, MD   BP 138/90 mmHg  Pulse 72  Temp(Src) 99.1 F (37.3 C) (Oral)  Resp 16  SpO2 99% Physical Exam  Constitutional: She is oriented to person, place, and time. She appears well-developed and well-nourished. No distress.  HENT:  Head: Normocephalic and atraumatic.  Mouth/Throat: No oropharyngeal exudate.  Eyes: Pupils are equal, round, and reactive to light.  Neck: Normal range of motion. Neck supple.  Cardiovascular: Normal rate, regular rhythm and normal heart sounds.  Exam reveals no gallop and no friction rub.   No murmur heard. Pulmonary/Chest: Effort normal and breath sounds normal. No respiratory distress. She has no wheezes. She has no rales.  Abdominal: Soft. Bowel sounds are normal. She exhibits no distension and no mass. There is tenderness. There is rebound. There is no rigidity and no guarding.  Musculoskeletal: Normal range of motion. She exhibits no edema or tenderness.  Neurological: She is alert and oriented to person, place, and time.  Skin: Skin is warm and dry.  Psychiatric: She has a normal mood and affect.    ED Course  Procedures (including critical care time) Labs Review Labs Reviewed  CBC WITH DIFFERENTIAL/PLATELET - Abnormal; Notable for  the following:    HCT 35.4 (*)    All other components within normal limits  COMPREHENSIVE METABOLIC PANEL - Abnormal; Notable for the following:    Creatinine, Ser 1.50 (*)    ALT 10 (*)    GFR calc non Af Amer 36 (*)    GFR calc Af Amer 42 (*)    All other components within normal limits  LIPASE, BLOOD  TROPONIN I  URINALYSIS, ROUTINE W REFLEX MICROSCOPIC (NOT AT [facility])    Imaging Review Ct Abdomen Pelvis W Contrast  09/02/2014   CLINICAL DATA:  Generalized abdominal pain, nausea and vomiting, and diarrhea which began earlier today. Surgical history includes hysterectomy.  EXAM: CT ABDOMEN AND PELVIS WITH CONTRAST  TECHNIQUE: Multidetector CT imaging of the abdomen and pelvis was performed using the standard protocol following bolus administration of intravenous contrast.  CONTRAST:  OMNIPAQUE IOHEXOL 300 MG/ML IV. Oral contrast was also administered.  COMPARISON:  07/14/2011, 08/11/2010, 08/19/2009, 08/30/2007.  FINDINGS: Hepatobiliary: Liver normal in size and appearance. Normal-appearing gallbladder without calcified gallstones. No biliary ductal dilation.  Spleen:  Normal in size and appearance.  Pancreas:  Normal in appearance.  No pancreatic ductal dilation.  Adrenal glands:  Normal in appearance.  Genitourinary: Normal-appearing right kidney. Scarring involving the upper pole of the otherwise normal appearing left kidney, progressive since 2013. Normal-appearing decompressed urinary bladder.  Uterus surgically absent. Normal-appearing atrophic ovaries. No adnexal masses.  Gastrointestinal: Stomach normal in appearance for degree of distention. Normal-appearing small bowel. Scattered diverticula of arising from the sigmoid colon without evidence of acute diverticulitis. Entire colon relatively decompressed with expected stool burden. Cecum positioned in the right mid abdomen, with a normal-appearing retrocecal appendix in the right upper quadrant.  Ascites:  Absent.  Vascular: Minimal  aortoiliac atherosclerosis. Widely patent visceral arteries.  Lymphatic:  No pathologic lymphadenopathy in the abdomen or pelvis.  Other findings: None.  Musculoskeletal: Degenerative disc disease and spondylosis involving the lower thoracic spine. Severe degenerative changes involving the right hip with large geodes in the acetabular roof and the femoral head. Degenerative changes involving both sacroiliac joints.  Visualized lower thorax: Patchy ground-glass airspace opacity deep in the left lower lobe. Bronchiectasis involving the medial left lower lobe. Heart size upper normal to slightly enlarged.  IMPRESSION: 1. No acute abnormalities involving the abdomen or pelvis. 2. Scattered sigmoid colon diverticula without evidence of acute diverticulitis. 3. Scarring involving  the upper pole of the left kidney which is otherwise normal in appearance. 4. Patchy ground-glass opacity deep in the left lower lobe, consistent with acute infectious or inflammatory etiology. Bronchiectasis involving the medial left lower lobe. 5. Severe osteoarthritis involving the right hip.   Electronically Signed   By: Hulan Saas M.D.   On: 09/02/2014 19:52     EKG Interpretation   Date/Time:  Thursday September 02 2014 16:53:15 EDT Ventricular Rate:  72 PR Interval:  174 QRS Duration: 83 QT Interval:  411 QTC Calculation: 450 R Axis:   59 Text Interpretation:  Sinus rhythm Baseline wander in lead(s) V2 No  significant change since last tracing Confirmed by DOCHERTY  MD, MEGAN  [phone]) on 09/02/2014 5:08:10 PM      MDM   Final diagnoses:  Generalized abdominal pain    Pt is a 64 y.o. female with Pmhx as above who presents with about 15 hours of constant generalized abdominal pain with associated nausea and watery yellow diarrhea as well as subjective fevers and chills.  She denies recent antibiotic use, recent travel or suspicious food intake but states there is a woman at a program.  She goes to various had recent  diarrhea and her children have had diarrhea as well.  On physical exam, patient appears uncomfortable with a low-grade temperature that is in no acute distress.  She has normal bowel sounds, generalized tenderness with rebound but no guarding.  3.  Pulmonary exam is benign.  CBC with normal white count.  Urinalysis does not appear infected.  Plan on treating symptomatically and will get CT abdomen and pelvis with contrast assuming creatinine L for contrast, suspect gastroenteritis versus colitis.  Exam and history are not not consistent with appendicitis, cholecystitis or small bowel obstruction.    [name] evaluation in the Emergency Department is complete. It has been determined that no acute conditions requiring further emergency intervention are present at this time. The patient/guardian have been advised of the diagnosis and plan. We have discussed signs and symptoms that warrant return to the ED, such as changes or worsening in symptoms, worsening pain, fever, inability to tolerate PO.       Toy Cookey, MD 09/04/14 [phone]

## 2014-09-02 NOTE — Discharge Instructions (Signed)
Abdominal Pain, Women °Abdominal (stomach, pelvic, or belly) pain can be caused by many things. It is important to tell your doctor: °· The location of the pain. °· Does it come and go or is it present all the time? °· Are there things that start the pain (eating certain foods, exercise)? °· Are there other symptoms associated with the pain (fever, nausea, vomiting, diarrhea)? °All of this is helpful to know when trying to find the cause of the pain. °CAUSES  °· Stomach: virus or bacteria infection, or ulcer. °· Intestine: appendicitis (inflamed appendix), regional ileitis (Crohn's disease), ulcerative colitis (inflamed colon), irritable bowel syndrome, diverticulitis (inflamed diverticulum of the colon), or cancer of the stomach or intestine. °· Gallbladder disease or stones in the gallbladder. °· Kidney disease, kidney stones, or infection. °· Pancreas infection or cancer. °· Fibromyalgia (pain disorder). °· Diseases of the female organs: °¨ Uterus: fibroid (non-cancerous) tumors or infection. °¨ Fallopian tubes: infection or tubal pregnancy. °¨ Ovary: cysts or tumors. °¨ Pelvic adhesions (scar tissue). °¨ Endometriosis (uterus lining tissue growing in the pelvis and on the pelvic organs). °¨ Pelvic congestion syndrome (female organs filling up with blood just before the menstrual period). °¨ Pain with the menstrual period. °¨ Pain with ovulation (producing an egg). °¨ Pain with an IUD (intrauterine device, birth control) in the uterus. °¨ Cancer of the female organs. °· Functional pain (pain not caused by a disease, may improve without treatment). °· Psychological pain. °· Depression. °DIAGNOSIS  °Your doctor will decide the seriousness of your pain by doing an examination. °· Blood tests. °· X-rays. °· Ultrasound. °· CT scan (computed tomography, special type of X-ray). °· MRI (magnetic resonance imaging). °· Cultures, for infection. °· Barium enema (dye inserted in the large intestine, to better view it with  X-rays). °· Colonoscopy (looking in intestine with a lighted tube). °· Laparoscopy (minor surgery, looking in abdomen with a lighted tube). °· Major abdominal exploratory surgery (looking in abdomen with a large incision). °TREATMENT  °The treatment will depend on the cause of the pain.  °· Many cases can be observed and treated at home. °· Over-the-counter medicines recommended by your caregiver. °· Prescription medicine. °· Antibiotics, for infection. °· Birth control pills, for painful periods or for ovulation pain. °· Hormone treatment, for endometriosis. °· Nerve blocking injections. °· Physical therapy. °· Antidepressants. °· Counseling with a psychologist or psychiatrist. °· Minor or major surgery. °HOME CARE INSTRUCTIONS  °· Do not take laxatives, unless directed by your caregiver. °· Take over-the-counter pain medicine only if ordered by your caregiver. Do not take aspirin because it can cause an upset stomach or bleeding. °· Try a clear liquid diet (broth or water) as ordered by your caregiver. Slowly move to a bland diet, as tolerated, if the pain is related to the stomach or intestine. °· Have a thermometer and take your temperature several times a day, and record it. °· Bed rest and sleep, if it helps the pain. °· Avoid sexual intercourse, if it causes pain. °· Avoid stressful situations. °· Keep your follow-up appointments and tests, as your caregiver orders. °· If the pain does not go away with medicine or surgery, you may try: °¨ Acupuncture. °¨ Relaxation exercises (yoga, meditation). °¨ Group therapy. °¨ Counseling. °SEEK MEDICAL CARE IF:  °· You notice certain foods cause stomach pain. °· Your home care treatment is not helping your pain. °· You need stronger pain medicine. °· You want your IUD removed. °· You feel faint or   lightheaded. °· You develop nausea and vomiting. °· You develop a rash. °· You are having side effects or an allergy to your medicine. °SEEK IMMEDIATE MEDICAL CARE IF:  °· Your  pain does not go away or gets worse. °· You have a fever. °· Your pain is felt only in portions of the abdomen. The right side could possibly be appendicitis. The left lower portion of the abdomen could be colitis or diverticulitis. °· You are passing blood in your stools (bright red or black tarry stools, with or without vomiting). °· You have blood in your urine. °· You develop chills, with or without a fever. °· You pass out. °MAKE SURE YOU:  °· Understand these instructions. °· Will watch your condition. °· Will get help right away if you are not doing well or get worse. °Document Released: 12/10/2006 Document Revised: 06/29/2013 Document Reviewed: 12/30/2008 °[employer]® Patient Information ©2015 [employer]. This information is not intended to replace advice given to you by your health care provider. Make sure you discuss any questions you have with your health care provider. ° °

## 2014-09-02 NOTE — ED Notes (Signed)
Patient transported to CT 

## 2014-09-02 NOTE — ED Notes (Signed)
Pt arrives from home via EMS c/o generalized stomach pain that began this AM. Pt also c/o diarrhea and nausea but no vomiting.

## 2014-09-09 DIAGNOSIS — Z5181 Encounter for therapeutic drug level monitoring: Secondary | ICD-10-CM | POA: Diagnosis not present

## 2014-09-09 DIAGNOSIS — F1010 Alcohol abuse, uncomplicated: Secondary | ICD-10-CM | POA: Diagnosis not present

## 2014-09-09 DIAGNOSIS — F1920 Other psychoactive substance dependence, uncomplicated: Secondary | ICD-10-CM | POA: Diagnosis not present

## 2014-09-10 ENCOUNTER — Other Ambulatory Visit: Payer: Self-pay | Admitting: *Deleted

## 2014-09-10 DIAGNOSIS — I252 Old myocardial infarction: Secondary | ICD-10-CM | POA: Diagnosis not present

## 2014-09-10 DIAGNOSIS — N189 Chronic kidney disease, unspecified: Secondary | ICD-10-CM | POA: Diagnosis not present

## 2014-09-10 DIAGNOSIS — M2550 Pain in unspecified joint: Secondary | ICD-10-CM

## 2014-09-10 DIAGNOSIS — I2510 Atherosclerotic heart disease of native coronary artery without angina pectoris: Secondary | ICD-10-CM | POA: Diagnosis not present

## 2014-09-10 DIAGNOSIS — I10 Essential (primary) hypertension: Secondary | ICD-10-CM | POA: Diagnosis not present

## 2014-09-10 DIAGNOSIS — E785 Hyperlipidemia, unspecified: Secondary | ICD-10-CM | POA: Diagnosis not present

## 2014-09-10 MED ORDER — OXYCODONE-ACETAMINOPHEN 10-325 MG PO TABS: 1.0000 | ORAL_TABLET | Freq: Three times a day (TID) | ORAL | Status: DC | PRN

## 2014-09-10 NOTE — Telephone Encounter (Signed)
Pt aware.

## 2014-10-04 DIAGNOSIS — R32 Unspecified urinary incontinence: Secondary | ICD-10-CM | POA: Diagnosis not present

## 2014-10-04 DIAGNOSIS — J45901 Unspecified asthma with (acute) exacerbation: Secondary | ICD-10-CM | POA: Diagnosis not present

## 2014-10-04 DIAGNOSIS — M1990 Unspecified osteoarthritis, unspecified site: Secondary | ICD-10-CM | POA: Diagnosis not present

## 2014-10-04 DIAGNOSIS — R609 Edema, unspecified: Secondary | ICD-10-CM | POA: Diagnosis not present

## 2014-10-06 ENCOUNTER — Other Ambulatory Visit: Payer: Self-pay | Admitting: Internal Medicine

## 2014-10-06 DIAGNOSIS — M2550 Pain in unspecified joint: Secondary | ICD-10-CM

## 2014-10-06 MED ORDER — OXYCODONE-ACETAMINOPHEN 10-325 MG PO TABS: 1.0000 | ORAL_TABLET | Freq: Three times a day (TID) | ORAL | Status: DC | PRN

## 2014-10-06 NOTE — Telephone Encounter (Signed)
Pt called requesting pain med to be filled. °

## 2014-10-06 NOTE — Telephone Encounter (Signed)
Last refill 7/15 Last OV 6/15

## 2014-10-07 NOTE — Telephone Encounter (Signed)
Pt informed Rx is ready 

## 2014-10-12 ENCOUNTER — Emergency Department (HOSPITAL_COMMUNITY)
Admission: EM | Admit: 2014-10-12 | Discharge: 2014-10-12 | Disposition: A | Discharge status: Home / Self Care | Payer: Medicare Other | Attending: Emergency Medicine | Admitting: Emergency Medicine

## 2014-10-12 ENCOUNTER — Telehealth: Payer: Self-pay | Admitting: Internal Medicine

## 2014-10-12 ENCOUNTER — Emergency Department (HOSPITAL_COMMUNITY): Payer: Medicare Other

## 2014-10-12 ENCOUNTER — Encounter (HOSPITAL_COMMUNITY): Payer: Self-pay | Admitting: Emergency Medicine

## 2014-10-12 DIAGNOSIS — E785 Hyperlipidemia, unspecified: Secondary | ICD-10-CM | POA: Diagnosis not present

## 2014-10-12 DIAGNOSIS — I2510 Atherosclerotic heart disease of native coronary artery without angina pectoris: Secondary | ICD-10-CM | POA: Diagnosis not present

## 2014-10-12 DIAGNOSIS — Z9981 Dependence on supplemental oxygen: Secondary | ICD-10-CM | POA: Insufficient documentation

## 2014-10-12 DIAGNOSIS — M7989 Other specified soft tissue disorders: Secondary | ICD-10-CM | POA: Diagnosis present

## 2014-10-12 DIAGNOSIS — Z79899 Other long term (current) drug therapy: Secondary | ICD-10-CM | POA: Insufficient documentation

## 2014-10-12 DIAGNOSIS — R109 Unspecified abdominal pain: Secondary | ICD-10-CM | POA: Insufficient documentation

## 2014-10-12 DIAGNOSIS — N183 Chronic kidney disease, stage 3 (moderate): Secondary | ICD-10-CM | POA: Insufficient documentation

## 2014-10-12 DIAGNOSIS — I509 Heart failure, unspecified: Secondary | ICD-10-CM | POA: Diagnosis not present

## 2014-10-12 DIAGNOSIS — J45901 Unspecified asthma with (acute) exacerbation: Secondary | ICD-10-CM | POA: Insufficient documentation

## 2014-10-12 DIAGNOSIS — I129 Hypertensive chronic kidney disease with stage 1 through stage 4 chronic kidney disease, or unspecified chronic kidney disease: Secondary | ICD-10-CM | POA: Diagnosis not present

## 2014-10-12 DIAGNOSIS — M25551 Pain in right hip: Secondary | ICD-10-CM | POA: Diagnosis not present

## 2014-10-12 DIAGNOSIS — E669 Obesity, unspecified: Secondary | ICD-10-CM | POA: Diagnosis not present

## 2014-10-12 DIAGNOSIS — G4733 Obstructive sleep apnea (adult) (pediatric): Secondary | ICD-10-CM | POA: Insufficient documentation

## 2014-10-12 DIAGNOSIS — Z86718 Personal history of other venous thrombosis and embolism: Secondary | ICD-10-CM | POA: Insufficient documentation

## 2014-10-12 DIAGNOSIS — F329 Major depressive disorder, single episode, unspecified: Secondary | ICD-10-CM | POA: Diagnosis not present

## 2014-10-12 DIAGNOSIS — Z7951 Long term (current) use of inhaled steroids: Secondary | ICD-10-CM | POA: Diagnosis not present

## 2014-10-12 DIAGNOSIS — M1611 Unilateral primary osteoarthritis, right hip: Secondary | ICD-10-CM | POA: Diagnosis not present

## 2014-10-12 LAB — COMPREHENSIVE METABOLIC PANEL
ALBUMIN: 3.6 g/dL (ref 3.5–5.0)
ALT: 14 U/L (ref 14–54)
ANION GAP: 9 (ref 5–15)
AST: 24 U/L (ref 15–41)
Alkaline Phosphatase: 93 U/L (ref 38–126)
BUN: 16 mg/dL (ref 6–20)
CHLORIDE: 100 mmol/L — AB (ref 101–111)
CO2: 27 mmol/L (ref 22–32)
Calcium: 8.7 mg/dL — ABNORMAL LOW (ref 8.9–10.3)
Creatinine, Ser: 1.84 mg/dL — ABNORMAL HIGH (ref 0.44–1.00)
GFR calc Af Amer: 33 mL/min — ABNORMAL LOW (ref >60)
GFR calc non Af Amer: 28 mL/min — ABNORMAL LOW (ref >60)
GLUCOSE: 86 mg/dL (ref 65–99)
POTASSIUM: 3.6 mmol/L (ref 3.5–5.1)
SODIUM: 136 mmol/L (ref 135–145)
Total Bilirubin: 1.1 mg/dL (ref 0.3–1.2)
Total Protein: 7.4 g/dL (ref 6.5–8.1)

## 2014-10-12 LAB — CBC WITH DIFFERENTIAL/PLATELET
Basophils Absolute: 0 10*3/uL (ref 0.0–0.1)
Basophils Relative: 0 % (ref 0–1)
Eosinophils Absolute: 0.2 10*3/uL (ref 0.0–0.7)
Eosinophils Relative: 4 % (ref 0–5)
HEMATOCRIT: 35.5 % — AB (ref 36.0–46.0)
Hemoglobin: 11.7 g/dL — ABNORMAL LOW (ref 12.0–15.0)
LYMPHS PCT: 29 % (ref 12–46)
Lymphs Abs: 1.4 10*3/uL (ref 0.7–4.0)
MCH: 30.6 pg (ref 26.0–34.0)
MCHC: 33 g/dL (ref 30.0–36.0)
MCV: 92.9 fL (ref 78.0–100.0)
MONOS PCT: 6 % (ref 3–12)
Monocytes Absolute: 0.3 10*3/uL (ref 0.1–1.0)
NEUTROS ABS: 2.9 10*3/uL (ref 1.7–7.7)
Neutrophils Relative %: 61 % (ref 43–77)
Platelets: 212 10*3/uL (ref 150–400)
RBC: 3.82 MIL/uL — ABNORMAL LOW (ref 3.87–5.11)
RDW: 12.9 % (ref 11.5–15.5)
WBC: 4.8 10*3/uL (ref 4.0–10.5)

## 2014-10-12 LAB — URINALYSIS, ROUTINE W REFLEX MICROSCOPIC
BILIRUBIN URINE: NEGATIVE
Glucose, UA: NEGATIVE mg/dL
HGB URINE DIPSTICK: NEGATIVE
Ketones, ur: NEGATIVE mg/dL
Leukocytes, UA: NEGATIVE
NITRITE: NEGATIVE
PH: 5.5 (ref 5.0–8.0)
Protein, ur: NEGATIVE mg/dL
SPECIFIC GRAVITY, URINE: 1.01 (ref 1.005–1.030)
Urobilinogen, UA: 0.2 mg/dL (ref 0.0–1.0)

## 2014-10-12 LAB — I-STAT CHEM 8, ED
BUN: 19 mg/dL (ref 6–20)
Calcium, Ion: 1.03 mmol/L — ABNORMAL LOW (ref 1.13–1.30)
Chloride: 102 mmol/L (ref 101–111)
Creatinine, Ser: 1.7 mg/dL — ABNORMAL HIGH (ref 0.44–1.00)
Glucose, Bld: 95 mg/dL (ref 65–99)
HCT: 35 % — ABNORMAL LOW (ref 36.0–46.0)
Hemoglobin: 11.9 g/dL — ABNORMAL LOW (ref 12.0–15.0)
Potassium: 3.3 mmol/L — ABNORMAL LOW (ref 3.5–5.1)
Sodium: 135 mmol/L (ref 135–145)
TCO2: 25 mmol/L (ref 0–100)

## 2014-10-12 MED ORDER — FENTANYL CITRATE (PF) 100 MCG/2ML IJ SOLN
50.0000 ug | Freq: Once | INTRAMUSCULAR | Status: AC
Start: 2014-10-12 — End: 2014-10-12
  Administered 2014-10-12: 50 ug via INTRAVENOUS
  Filled 2014-10-12: qty 2

## 2014-10-12 MED ORDER — MORPHINE SULFATE (PF) 2 MG/ML IV SOLN: 2.0000 mg | Freq: Once | INTRAVENOUS | Status: DC

## 2014-10-12 MED ORDER — FENTANYL CITRATE (PF) 100 MCG/2ML IJ SOLN
50.0000 ug | Freq: Once | INTRAMUSCULAR | Status: AC
  Administered 2014-10-12: 50 ug via INTRAVENOUS
  Filled 2014-10-12: qty 2

## 2014-10-12 NOTE — ED Notes (Addendum)
Pain on R side especially when lies down or walks. Burning with urination. Bilateral leg swelling for 2 days. Denies chest pain or SOB. Hx of DVTs in legs; takes blood thinners for it everyday.

## 2014-10-12 NOTE — ED Notes (Signed)
PA at the bedside.

## 2014-10-12 NOTE — Telephone Encounter (Signed)
Call to patient to confirm appointment for 10/13/14 at 10:45 lmtcb °

## 2014-10-12 NOTE — Discharge Instructions (Signed)
Arthralgia Arthralgia is joint pain. A joint is a place where two bones meet. Joint pain can happen for many reasons. The joint can be bruised, stiff, infected, or weak from aging. Pain usually goes away after resting and taking medicine for soreness.  HOME CARE  Rest the joint as told by your doctor.  Keep the sore joint raised (elevated) for the first 24 hours.  Put ice on the joint area.  Put ice in a plastic bag.  Place a towel between your skin and the bag.  Leave the ice on for 15-20 minutes, 03-04 times a day.  Wear your splint, casting, elastic bandage, or sling as told by your doctor.  Only take medicine as told by your doctor. Do not take aspirin.  Use crutches as told by your doctor. Do not put weight on the joint until told to by your doctor. GET HELP RIGHT AWAY IF:   You have bruising, puffiness (swelling), or more pain.  Your fingers or toes turn blue or start to lose feeling (numb).  Your medicine does not lessen the pain.  Your pain becomes severe.  You have a temperature by mouth above 102 F (38.9 C), not controlled by medicine.  You cannot move or use the joint. MAKE SURE YOU:   Understand these instructions.  Will watch your condition.  Will get help right away if you are not doing well or get worse. Document Released: 01/31/2009 Document Revised: 05/07/2011 Document Reviewed: 01/31/2009 [facility] Patient Information 2015 [place], [place]. This information is not intended to replace advice given to you by your health care provider. Make sure you discuss any questions you have with your health care provider.  Please continue using your at-home pain relief medication as needed. Please follow-up with your primary care provider as previously scheduled. Please monitor for new or worsening signs or symptoms, follow-up immediately if any present.

## 2014-10-12 NOTE — ED Notes (Signed)
Patient returned from X-ray 

## 2014-10-12 NOTE — ED Provider Notes (Signed)
CSN: [id number]     Arrival date & time 10/12/14  1052 History   First MD Initiated Contact with Patient 10/12/14 1251     Chief Complaint  Patient presents with  . Flank Pain  . Leg Swelling    HPI   64 year old female presents today with right hip pain. Patient reports the pain has worsened over the last 2 days. Patient reports she has extreme sharp pain in her right hip, made worse when walking or lying on that hip. She states she was seen here in the emergency room for abdominal pain recently, per reports this is unlike that this is on the right anterior hip, she has difficulty with ambulation. Patient reports she has pain medication at home that she is tried with minimal relief of symptoms. Patient reports she has had right hip pain recently, but not severe. She notes she uses a walker at home, has been able to ambulate today. Patient denies fever, chills, inability to flex or extend the hip, lower extremity pain, neurological, or strength deficits. Patient reports that she does have bilateral leg swelling, but this is normal for her as she seen her primary care and placed on Lasix for this. She denies this is the worst and normal. Patient denies shortness of breath, chest pain, nausea, vomiting, abdominal pain, vaginal discharge. She does note burning with urination.   Past Medical History  Diagnosis Date  . Hyperlipidemia   . Hypertension   . Gout   . Obesity   . Depression   . DJD (degenerative joint disease)   . OSA (obstructive sleep apnea)     previously used CPAP, has lost  . CAD (coronary artery disease)     s/p non-Q wave MI in 06/2001 and 2004, 2005  . ALCOHOL ABUSE 12/13/2005    Annotation: Sober since 11/06 Qualifier: Diagnosis of  By: Wallace Cullens MD, Natalia Leatherwood    . INTRINSIC ASTHMA, WITH EXACERBATION 09/27/2009    Qualifier: Diagnosis of  By: Denton Meek MD, Tillie Rung    . Chronic kidney disease (CKD), stage III (moderate) 09/19/2010  . CHF (congestive heart failure)   . DVT of lower  extremity, bilateral 04/04/2012    On Xarelto     Past Surgical History  Procedure Laterality Date  . Partial hysterectomy      due to endometriosis  . Tubal ligation    . Cardiac catheterization      06/2001, 02/2002, 10/2004 (10-15% proximal stenosis of circumflex, diffuse disease of  OM1 and RCA)   Family History  Problem Relation Age of Onset  . Mental illness Mother   . Heart disease Father   . Diabetes Sister   . Diabetes Sister   . Diabetes Sister    Social History  Substance Use Topics  . Smoking status: Never Smoker   . Smokeless tobacco: None  . Alcohol Use: No   OB History    No data available     Review of Systems  All other systems reviewed and are negative.   Allergies  Ace inhibitors; Lexiscan; Peanuts; Hydromorphone hcl; Percocet; Propoxyphene n-acetaminophen; and Vicodin  Home Medications   Prior to Admission medications   Medication Sig Start Date End Date Taking? Authorizing Provider  acetaminophen (TYLENOL) 500 MG tablet Take 1,000 mg by mouth every 6 (six) hours as needed for moderate pain.   Yes Historical Provider, MD  amLODipine (NORVASC) 10 MG tablet TAKE 1 TABLET (10 MG TOTAL) BY MOUTH DAILY. 08/21/14  Yes Inez Catalina, MD  atorvastatin (LIPITOR) 40 MG tablet TAKE 1 TABLET (40 MG TOTAL) BY MOUTH DAILY. 08/21/14  Yes Inez Catalina, MD  buPROPion (WELLBUTRIN XL) 150 MG 24 hr tablet Take 1 tablet (150 mg total) by mouth every morning. 08/11/14  Yes Inez Catalina, MD  famotidine (PEPCID) 20 MG tablet Take 1 tablet (20 mg total) by mouth 2 (two) times daily. 08/11/14  Yes Inez Catalina, MD  fluticasone [facility]) 50 MCG/ACT nasal spray INSTILL TWO   SPRAYS IN EACH NOSTRIL ONCE DAILY 06/18/14  Yes Burns Spain, MD  losartan (COZAAR) 100 MG tablet TAKE 1 TABLET (100 MG TOTAL) BY MOUTH TWO   TIMES DAILY. Patient taking differently: TAKE 1 TABLET (100 MG TOTAL) BY MOUTH DAILY 03/02/14  Yes Inez Catalina, MD  metoprolol (LOPRESSOR) 50 MG tablet Take 1  tablet (50 mg total) by mouth daily. 05/12/14 05/12/15 Yes Inez Catalina, MD  nitroGLYCERIN (NITROSTAT) 0.4 MG SL tablet Place 0.4 mg under the tongue every 5 (five) minutes as needed for chest pain.   Yes Historical Provider, MD  rivaroxaban (XARELTO) 20 MG TABS tablet TAKE 1 TABLET (20 MG TOTAL) BY MOUTH DAILY WITH SUPPER. Patient taking differently: Take 20 mg by mouth daily.  05/05/14  Yes Inez Catalina, MD  TOVIAZ 8 MG TB24 Take 8 mg by mouth Daily. 11/01/11  Yes Historical Provider, MD  traZODone (DESYREL) 50 MG tablet TAKE 0.5 TABLETS (25 MG TOTAL) BY MOUTH AT BEDTIME. 06/18/14  Yes Burns Spain, MD  b complex vitamins tablet Take 1 tablet by mouth daily. Patient not taking: Reported on 05/18/2014 01/06/14   Inez Catalina, MD  fluticasone [facility]) 44 MCG/ACT inhaler Inhale 2 puffs into the lungs 2 (two) times daily. Patient not taking: Reported on 05/18/2014 06/02/13   Pasty Spillers McLean-Scocozza, MD  furosemide (LASIX) 20 MG tablet Take 2 tablets (40 mg total) by mouth daily. 10/13/14   Inez Catalina, MD  oxyCODONE-acetaminophen (PERCOCET) 10-325 MG per tablet Take 1 tablet by mouth every 8 (eight) hours as needed for pain. 10/13/14 10/03/15  Inez Catalina, MD  predniSONE (DELTASONE) 50 MG tablet Take 1 tablet (50 mg total) by mouth daily with breakfast. 10/13/14   Inez Catalina, MD   BP 124/74 mmHg  Pulse 85  Temp(Src) 98.7 F (37.1 C) (Oral)  Resp 20  Wt 233 lb 7 oz (105.887 kg)  SpO2 93%   Physical Exam  Constitutional: She is oriented to person, place, and time. She appears well-developed and well-nourished.  HENT:  Head: Normocephalic and atraumatic.  Eyes: Conjunctivae are normal. Pupils are equal, round, and reactive to light. Right eye exhibits no discharge. Left eye exhibits no discharge. No scleral icterus.  Neck: Normal range of motion. No JVD present. No tracheal deviation present.  Cardiovascular: Normal rate, regular rhythm, normal heart sounds and intact distal  pulses.   Pulmonary/Chest: Effort normal. No stridor.  Abdominal: Soft. Bowel sounds are normal. She exhibits no distension and no mass. There is no tenderness. There is no rebound, no guarding and no CVA tenderness.  Musculoskeletal:  Bilateral lower extremity edema, symmetrical. Significant pain to palpation of right lateral hip. She has pain with flexion extension internal and external rotation. Distal sensation intact, strength intact, cap refill less than 3 seconds, pedal pulses intact.  Neurological: She is alert and oriented to person, place, and time. Coordination normal.  Skin: Skin is warm and dry.  Psychiatric: She has a normal mood and affect.  Her behavior is normal. Judgment and thought content normal.  Nursing note and vitals reviewed.     ED Course  Procedures (including critical care time) Labs Review Labs Reviewed  COMPREHENSIVE METABOLIC PANEL - Abnormal; Notable for the following:    Chloride 100 (*)    Creatinine, Ser 1.84 (*)    Calcium 8.7 (*)    GFR calc non Af Amer 28 (*)    GFR calc Af Amer 33 (*)    All other components within normal limits  CBC WITH DIFFERENTIAL/PLATELET - Abnormal; Notable for the following:    RBC 3.82 (*)    Hemoglobin 11.7 (*)    HCT 35.5 (*)    All other components within normal limits  I-STAT CHEM 8, ED - Abnormal; Notable for the following:    Potassium 3.3 (*)    Creatinine, Ser 1.70 (*)    Calcium, Ion 1.03 (*)    Hemoglobin 11.9 (*)    HCT 35.0 (*)    All other components within normal limits  URINALYSIS, ROUTINE W REFLEX MICROSCOPIC (NOT AT [facility])    Imaging Review Dg Hip Unilat With Pelvis 2-3 Views Right  10/12/2014   CLINICAL DATA:  Severe right hip pain for 2 days.  No known injury.  EXAM: DG HIP (WITH OR WITHOUT PELVIS) 2-3V RIGHT  COMPARISON:  None.  FINDINGS: There is severe osteoarthritis of the right hip with superior joint space narrowing with cystic degenerative changes of the femoral head and acetabulum with a  prominent osteophyte on the superior lateral aspect of the acetabulum. Of the left hip appears normal. The visualized pelvic bones are otherwise normal.  IMPRESSION: Severe osteoarthritis of the right hip. No acute osseous abnormality.   Electronically Signed   By: Francene Boyers M.D.   On: 10/12/2014 15:24   I have personally reviewed and evaluated these images and lab results as part of my medical decision-making.   EKG Interpretation None      MDM   Final diagnoses:  Hip pain, right    Labs:  Imaging:  Consults:  Therapeutics:  Discharge Meds:   Assessment/Plan: Patient presents with right hip pain. She is tender to palpation of the lateral aspect of the hip, pain worsened with all range of motion. X-ray today shows severe arthritis in the right hip, this is likely the source of patient's pain. Patient was treated here in the ED symptomatically, with improvement. Patient will be discharged home with instructions to follow-up with primary care provider for further evaluation and management. Patient has lower extremity edema, this is equal bilateral, unchanged from previous. Patient has full range of motion of the hip with pain, unlikely septic joint.          Eyvonne Mechanic, PA-C 10/13/14 1541  Melene Plan, DO 10/13/14 [phone]

## 2014-10-13 ENCOUNTER — Ambulatory Visit (INDEPENDENT_AMBULATORY_CARE_PROVIDER_SITE_OTHER): Payer: Medicare Other | Admitting: Internal Medicine

## 2014-10-13 ENCOUNTER — Encounter: Payer: Self-pay | Admitting: Internal Medicine

## 2014-10-13 ENCOUNTER — Encounter: Payer: Self-pay | Admitting: Licensed Clinical Social Worker

## 2014-10-13 VITALS — BP 112/66 | HR 84 | Temp 98.6°F | Wt 235.1 lb

## 2014-10-13 DIAGNOSIS — M2550 Pain in unspecified joint: Secondary | ICD-10-CM | POA: Diagnosis not present

## 2014-10-13 DIAGNOSIS — N183 Chronic kidney disease, stage 3 unspecified: Secondary | ICD-10-CM

## 2014-10-13 DIAGNOSIS — M1611 Unilateral primary osteoarthritis, right hip: Secondary | ICD-10-CM | POA: Diagnosis not present

## 2014-10-13 MED ORDER — PREDNISONE 50 MG PO TABS: 50.0000 mg | ORAL_TABLET | Freq: Every day | ORAL | Status: DC

## 2014-10-13 MED ORDER — OXYCODONE-ACETAMINOPHEN 10-325 MG PO TABS: 1.0000 | ORAL_TABLET | Freq: Three times a day (TID) | ORAL | Status: DC | PRN

## 2014-10-13 MED ORDER — FUROSEMIDE 20 MG PO TABS: 40.0000 mg | ORAL_TABLET | Freq: Every day | ORAL | Status: DC

## 2014-10-13 NOTE — Patient Instructions (Signed)
Thank you for coming in today.   For your hip pain, please continue to take your percocet as needed.   You will have a prescription for prednisone 50mg  daily for 5 days to decrease your pain.   You will have a referral to the Orthopedic (bone) surgeon to discuss your hip arthritis.   Please schedule a follow up with me in 2 months.   Please call with any questions.    Prednisone tablets What is this medicine? PREDNISONE (PRED ni sone) is a corticosteroid. It is commonly used to treat inflammation of the skin, joints, lungs, and other organs. Common conditions treated include asthma, allergies, and arthritis. It is also used for other conditions, such as blood disorders and diseases of the adrenal glands. This medicine may be used for other purposes; ask your health care provider or pharmacist if you have questions. COMMON BRAND NAME(S): Deltasone, Predone, Sterapred, Sterapred DS What should I tell my health care provider before I take this medicine? They need to know if you have any of these conditions: -Cushing's syndrome -diabetes -glaucoma -heart disease -high blood pressure -infection (especially a virus infection such as chickenpox, cold sores, or herpes) -kidney disease -liver disease -mental illness -myasthenia gravis -osteoporosis -seizures -stomach or intestine problems -thyroid disease -an unusual or allergic reaction to lactose, prednisone, other medicines, foods, dyes, or preservatives -pregnant or trying to get pregnant -breast-feeding How should I use this medicine? Take this medicine by mouth with a glass of water. Follow the directions on the prescription label. Take this medicine with food. If you are taking this medicine once a day, take it in the morning. Do not take more medicine than you are told to take. Do not suddenly stop taking your medicine because you may develop a severe reaction. Your doctor will tell you how much medicine to take. If your doctor  wants you to stop the medicine, the dose may be slowly lowered over time to avoid any side effects. Talk to your pediatrician regarding the use of this medicine in children. Special care may be needed. Overdosage: If you think you have taken too much of this medicine contact a poison control center or emergency room at once. NOTE: This medicine is only for you. Do not share this medicine with others. What if I miss a dose? If you miss a dose, take it as soon as you can. If it is almost time for your next dose, talk to your doctor or health care professional. You may need to miss a dose or take an extra dose. Do not take double or extra doses without advice. What may interact with this medicine? Do not take this medicine with any of the following medications: -metyrapone -mifepristone This medicine may also interact with the following medications: -aminoglutethimide -amphotericin B -aspirin and aspirin-like medicines -barbiturates -certain medicines for diabetes, like glipizide or glyburide -cholestyramine -cholinesterase inhibitors -cyclosporine -digoxin -diuretics -ephedrine -female hormones, like estrogens and birth control pills -isoniazid -ketoconazole -NSAIDS, medicines for pain and inflammation, like ibuprofen or naproxen -phenytoin -rifampin -toxoids -vaccines -warfarin This list may not describe all possible interactions. Give your health care provider a list of all the medicines, herbs, non-prescription drugs, or dietary supplements you use. Also tell them if you smoke, drink alcohol, or use illegal drugs. Some items may interact with your medicine. What should I watch for while using this medicine? Visit your doctor or health care professional for regular checks on your progress. If you are taking this medicine over a  prolonged period, carry an identification card with your name and address, the type and dose of your medicine, and your doctor's name and address. This  medicine may increase your risk of getting an infection. Tell your doctor or health care professional if you are around anyone with measles or chickenpox, or if you develop sores or blisters that do not heal properly. If you are going to have surgery, tell your doctor or health care professional that you have taken this medicine within the last twelve months. Ask your doctor or health care professional about your diet. You may need to lower the amount of salt you eat. This medicine may affect blood sugar levels. If you have diabetes, check with your doctor or health care professional before you change your diet or the dose of your diabetic medicine. What side effects may I notice from receiving this medicine? Side effects that you should report to your doctor or health care professional as soon as possible: -allergic reactions like skin rash, itching or hives, swelling of the face, lips, or tongue -changes in emotions or moods -changes in vision -depressed mood -eye pain -fever or chills, cough, sore throat, pain or difficulty passing urine -increased thirst -swelling of ankles, feet Side effects that usually do not require medical attention (report to your doctor or health care professional if they continue or are bothersome): -confusion, excitement, restlessness -headache -nausea, vomiting -skin problems, acne, thin and shiny skin -trouble sleeping -weight gain This list may not describe all possible side effects. Call your doctor for medical advice about side effects. You may report side effects to FDA at 1-800-FDA-1088. Where should I keep my medicine? Keep out of the reach of children. Store at room temperature between 15 and 30 degrees C (59 and 86 degrees F). Protect from light. Keep container tightly closed. Throw away any unused medicine after the expiration date. NOTE: This sheet is a summary. It may not cover all possible information. If you have questions about this medicine,  talk to your doctor, pharmacist, or health care provider.  2015, [employer]. (2010-09-28 10:57:14)

## 2014-10-13 NOTE — Progress Notes (Signed)
   Subjective:    Patient ID: Vanessa Palmer, female    DOB: 22-Feb-1951, 64 y.o.   MRN: [id number]  F/U right hip pain, ED yesterday  HPI  Ms. Witcher is a 64yo woman with PMH of CKD, LE edema and hip pain.  Her hip pain has been chronic, however, it has acutely worsened.    Ms. Lucente has had chronic hip pain for a long while.  She has been doing much better over the last year or two, however, over the last 4-5 days she notes that her pain has gotten significantly worse.  The pain is sharp, starts over her back hip and radiates to hip on the side.  She has difficulty with movement and pain with significant moving.  She has taken her percocet, which she normally has a prescription for, but has had to increase to 2 pills at a time.  She has no hip swelling, fever, rash.  She was unable to comply with physical exam due to pain.  She has had OA for a long while, most recently she had a CT of the abdomen which showed severe OA in the right hip.    She also complains of bilateral LE swelling.  This had been noted at her last appointment 2 months ago and she was switched from chlorthalidone to lasix.  She has been taking this without issue.    She requests a letter for her housing to have her granddaughter stay with her to assist with ADLs.  She also requests updated forms to increase her personal care services.    Review of Systems  Constitutional: Negative for fever and chills.  Respiratory: Negative for cough, choking and shortness of breath.   Cardiovascular: Positive for leg swelling. Negative for chest pain and palpitations.  Gastrointestinal: Negative for nausea, vomiting and abdominal pain.  Genitourinary: Negative for dysuria and difficulty urinating.  Musculoskeletal: Positive for arthralgias and gait problem. Negative for myalgias and joint swelling.  Skin: Negative for rash and wound.  Neurological: Negative for dizziness, syncope and weakness.       Objective:   Physical Exam    Constitutional: She is oriented to person, place, and time. She appears well-developed. No distress.  Obese  HENT:  Head: Normocephalic and atraumatic.  Eyes: Conjunctivae are normal. No scleral icterus.  Pulmonary/Chest: Effort normal. No respiratory distress.  Musculoskeletal: She exhibits edema (bilateral to the mid calf) and tenderness.  ROM significantly decreased at right hip.  She had point tenderness at trochanteric bursa and at piriformis.  She was unable to comply with range of motion.  She was ambulatory but this was stilted due to pain with moving the leg.    Neurological: She is alert and oriented to person, place, and time.  Skin: Skin is warm and dry.  Psychiatric: She has a normal mood and affect. Her behavior is normal.        Assessment & Plan:  RTC in 1-2 months for follow up of chronic medical issues

## 2014-10-13 NOTE — Progress Notes (Signed)
CSW met with Vanessa Palmer during her scheduled [facility] appointment.  Pt states her PCS hours were declined and now due to increased hip pain she is in need of more PCS hours/more assistance.  In addition, pt had grand-daughter that is able to come and assist with ADL's on the weekend, however, pt in need of letter to provide to housing complex.  CSW placed call to [facility] to confirm pt is active with [facility].  Discussed need for change of medical status request and housing letter with PCP.  Pt denies add'l social work needs at this time. 

## 2014-10-14 NOTE — Assessment & Plan Note (Signed)
CT scan from July showed severe OA in the right hip.  She has had persistent symptoms for a long time that flare up occasionally.  She appears to be in an acute flare and her ambulation is much worse than the last time I saw her.  Based on physical exam, I believe she may have bursitis or piriformis syndrome as well but she is adamantly refusing injection.   We discussed treatment options include steroid injection (she is scared of needles and refused) and surgical referral. She has CKD and cannot take NSAIDs at this time.   Will plan for -  1. Orthopedic referral 2. Short course of prednisone  qdaily for 5 days for inflammation 3. I went ahead and gave her the next Rx for her chronic opiates as she is requiring and increased dosage.  4. Defer further imaging at this time.

## 2014-10-14 NOTE — Assessment & Plan Note (Signed)
LE edema is unchanged with addition of lasix.   Increase lasix dose to  daily.  Renal function at last check (yesterday) was within her baseline.

## 2014-10-19 DIAGNOSIS — F1010 Alcohol abuse, uncomplicated: Secondary | ICD-10-CM | POA: Diagnosis not present

## 2014-10-25 ENCOUNTER — Telehealth: Payer: Self-pay | Admitting: Internal Medicine

## 2014-10-25 DIAGNOSIS — F1010 Alcohol abuse, uncomplicated: Secondary | ICD-10-CM | POA: Diagnosis not present

## 2014-10-25 NOTE — Telephone Encounter (Signed)
Ms Vanessa Palmer was given her Rx early because she was physically here.  She should only refill 30 days after last Rx.

## 2014-10-25 NOTE — Telephone Encounter (Signed)
Called pharm, pt last got pain med filled 8/12, script written 8/17 she now wants filled, they are advised that script may not be filled until 30 from last fill pick up, she may call clinic if she has questions

## 2014-10-25 NOTE — Telephone Encounter (Signed)
[facility] pharmacy called requesting the nurse to call back regarding pt want med to be filled 10 days early.

## 2014-10-30 ENCOUNTER — Encounter (HOSPITAL_COMMUNITY): Payer: Self-pay | Admitting: Emergency Medicine

## 2014-10-30 ENCOUNTER — Emergency Department (HOSPITAL_COMMUNITY)
Admission: EM | Admit: 2014-10-30 | Discharge: 2014-10-30 | Disposition: A | Discharge status: Home / Self Care | Payer: Medicare Other | Attending: Emergency Medicine | Admitting: Emergency Medicine

## 2014-10-30 DIAGNOSIS — Z79899 Other long term (current) drug therapy: Secondary | ICD-10-CM | POA: Insufficient documentation

## 2014-10-30 DIAGNOSIS — Z9889 Other specified postprocedural states: Secondary | ICD-10-CM | POA: Insufficient documentation

## 2014-10-30 DIAGNOSIS — G4733 Obstructive sleep apnea (adult) (pediatric): Secondary | ICD-10-CM | POA: Insufficient documentation

## 2014-10-30 DIAGNOSIS — Z86718 Personal history of other venous thrombosis and embolism: Secondary | ICD-10-CM | POA: Diagnosis not present

## 2014-10-30 DIAGNOSIS — N183 Chronic kidney disease, stage 3 (moderate): Secondary | ICD-10-CM | POA: Insufficient documentation

## 2014-10-30 DIAGNOSIS — F329 Major depressive disorder, single episode, unspecified: Secondary | ICD-10-CM | POA: Diagnosis not present

## 2014-10-30 DIAGNOSIS — Z7901 Long term (current) use of anticoagulants: Secondary | ICD-10-CM | POA: Diagnosis not present

## 2014-10-30 DIAGNOSIS — G8929 Other chronic pain: Secondary | ICD-10-CM | POA: Diagnosis not present

## 2014-10-30 DIAGNOSIS — Z7952 Long term (current) use of systemic steroids: Secondary | ICD-10-CM | POA: Diagnosis not present

## 2014-10-30 DIAGNOSIS — I129 Hypertensive chronic kidney disease with stage 1 through stage 4 chronic kidney disease, or unspecified chronic kidney disease: Secondary | ICD-10-CM | POA: Insufficient documentation

## 2014-10-30 DIAGNOSIS — E669 Obesity, unspecified: Secondary | ICD-10-CM | POA: Insufficient documentation

## 2014-10-30 DIAGNOSIS — I509 Heart failure, unspecified: Secondary | ICD-10-CM | POA: Insufficient documentation

## 2014-10-30 DIAGNOSIS — I2510 Atherosclerotic heart disease of native coronary artery without angina pectoris: Secondary | ICD-10-CM | POA: Diagnosis not present

## 2014-10-30 DIAGNOSIS — E785 Hyperlipidemia, unspecified: Secondary | ICD-10-CM | POA: Insufficient documentation

## 2014-10-30 DIAGNOSIS — Z9981 Dependence on supplemental oxygen: Secondary | ICD-10-CM | POA: Insufficient documentation

## 2014-10-30 DIAGNOSIS — M25551 Pain in right hip: Secondary | ICD-10-CM | POA: Diagnosis not present

## 2014-10-30 DIAGNOSIS — J45909 Unspecified asthma, uncomplicated: Secondary | ICD-10-CM | POA: Diagnosis not present

## 2014-10-30 DIAGNOSIS — Z7951 Long term (current) use of inhaled steroids: Secondary | ICD-10-CM | POA: Diagnosis not present

## 2014-10-30 MED ORDER — OXYCODONE-ACETAMINOPHEN 5-325 MG PO TABS
1.0000 | ORAL_TABLET | Freq: Once | ORAL | Status: AC
  Administered 2014-10-30: 1 via ORAL
  Filled 2014-10-30: qty 1

## 2014-10-30 MED ORDER — KETOROLAC TROMETHAMINE 60 MG/2ML IM SOLN
30.0000 mg | Freq: Once | INTRAMUSCULAR | Status: AC
  Administered 2014-10-30: 30 mg via INTRAMUSCULAR
  Filled 2014-10-30: qty 2

## 2014-10-30 NOTE — Discharge Instructions (Signed)
Please follow-up with your doctor for further pain management.   Arthritis, Nonspecific Arthritis is inflammation of a joint. This usually means pain, redness, warmth or swelling are present. One or more joints may be involved. There are a number of types of arthritis. Your caregiver may not be able to tell what type of arthritis you have right away. CAUSES  The most common cause of arthritis is the wear and tear on the joint (osteoarthritis). This causes damage to the cartilage, which can break down over time. The knees, hips, back and neck are most often affected by this type of arthritis. Other types of arthritis and common causes of joint pain include:  Sprains and other injuries near the joint. Sometimes minor sprains and injuries cause pain and swelling that develop hours later.  Rheumatoid arthritis. This affects hands, feet and knees. It usually affects both sides of your body at the same time. It is often associated with chronic ailments, fever, weight loss and general weakness.  Crystal arthritis. Gout and pseudo gout can cause occasional acute severe pain, redness and swelling in the foot, ankle, or knee.  Infectious arthritis. Bacteria can get into a joint through a break in overlying skin. This can cause infection of the joint. Bacteria and viruses can also spread through the blood and affect your joints.  Drug, infectious and allergy reactions. Sometimes joints can become mildly painful and slightly swollen with these types of illnesses. SYMPTOMS   Pain is the main symptom.  Your joint or joints can also be red, swollen and warm or hot to the touch.  You may have a fever with certain types of arthritis, or even feel overall ill.  The joint with arthritis will hurt with movement. Stiffness is present with some types of arthritis. DIAGNOSIS  Your caregiver will suspect arthritis based on your description of your symptoms and on your exam. Testing may be needed to find the type  of arthritis:  Blood and sometimes urine tests.  X-ray tests and sometimes CT or MRI scans.  Removal of fluid from the joint (arthrocentesis) is done to check for bacteria, crystals or other causes. Your caregiver (or a specialist) will numb the area over the joint with a local anesthetic, and use a needle to remove joint fluid for examination. This procedure is only minimally uncomfortable.  Even with these tests, your caregiver may not be able to tell what kind of arthritis you have. Consultation with a specialist (rheumatologist) may be helpful. TREATMENT  Your caregiver will discuss with you treatment specific to your type of arthritis. If the specific type cannot be determined, then the following general recommendations may apply. Treatment of severe joint pain includes:  Rest.  Elevation.  Anti-inflammatory medication (for example, ibuprofen) may be prescribed. Avoiding activities that cause increased pain.  Only take over-the-counter or prescription medicines for pain and discomfort as recommended by your caregiver.  Cold packs over an inflamed joint may be used for 10 to 15 minutes every hour. Hot packs sometimes feel better, but do not use overnight. Do not use hot packs if you are diabetic without your caregiver's permission.  A cortisone shot into arthritic joints may help reduce pain and swelling.  Any acute arthritis that gets worse over the next 1 to 2 days needs to be looked at to be sure there is no joint infection. Long-term arthritis treatment involves modifying activities and lifestyle to reduce joint stress jarring. This can include weight loss. Also, exercise is needed to nourish  the joint cartilage and remove waste. This helps keep the muscles around the joint strong. HOME CARE INSTRUCTIONS   Do not take aspirin to relieve pain if gout is suspected. This elevates uric acid levels.  Only take over-the-counter or prescription medicines for pain, discomfort or fever  as directed by your caregiver.  Rest the joint as much as possible.  If your joint is swollen, keep it elevated.  Use crutches if the painful joint is in your leg.  Drinking plenty of fluids may help for certain types of arthritis.  Follow your caregiver's dietary instructions.  Try low-impact exercise such as:  Swimming.  Water aerobics.  Biking.  Walking.  Morning stiffness is often relieved by a warm shower.  Put your joints through regular range-of-motion. SEEK MEDICAL CARE IF:   You do not feel better in 24 hours or are getting worse.  You have side effects to medications, or are not getting better with treatment. SEEK IMMEDIATE MEDICAL CARE IF:   You have a fever.  You develop severe joint pain, swelling or redness.  Many joints are involved and become painful and swollen.  There is severe back pain and/or leg weakness.  You have loss of bowel or bladder control. Document Released: 03/22/2004 Document Revised: 05/07/2011 Document Reviewed: 04/07/2008 [facility] Patient Information 2015 [place], [place]. This information is not intended to replace advice given to you by your health care provider. Make sure you discuss any questions you have with your health care provider.

## 2014-10-30 NOTE — ED Notes (Signed)
Pt reports percocet for pain management, states she ran out and is unable to get more at this time.

## 2014-10-30 NOTE — ED Provider Notes (Signed)
CSN: [id number]     Arrival date & time 10/30/14  2023 History  This chart was scribed for non-physician practitioner, Jaynie Crumble, PA-C working with Lyndal Pulley, MD by Gwenyth Ober, ED scribe. This patient was seen in room TR07C/TR07C and the patient's care was started at 8:56 PM   Chief Complaint  Patient presents with  . Hip Pain   The history is provided by the patient. No language interpreter was used.   HPI Comments: Vanessa Palmer is a 64 y.o. female who presents to the Emergency Department complaining of an acute-on-chronic episode of moderate right hip pain that became worse today. She tried Tylenol with no relief. Pt was seen in the ED on 8/16 for right hip pain. She has a follow-up scheduled with Dr. Rayburn Ma. Pt has a prescription for percocet from her PCP, but has been unable to have it filled at her pharmacy because it is too early to re-fill. She ambulates with a walker. Pt denies numbness.  Past Medical History  Diagnosis Date  . Hyperlipidemia   . Hypertension   . Gout   . Obesity   . Depression   . DJD (degenerative joint disease)   . OSA (obstructive sleep apnea)     previously used CPAP, has lost  . CAD (coronary artery disease)     s/p non-Q wave MI in 06/2001 and 2004, 2005  . ALCOHOL ABUSE 12/13/2005    Annotation: Sober since 11/06 Qualifier: Diagnosis of  By: Wallace Cullens MD, Natalia Leatherwood    . INTRINSIC ASTHMA, WITH EXACERBATION 09/27/2009    Qualifier: Diagnosis of  By: Denton Meek MD, Tillie Rung    . Chronic kidney disease (CKD), stage III (moderate) 09/19/2010  . CHF (congestive heart failure)   . DVT of lower extremity, bilateral 04/04/2012    On Xarelto     Past Surgical History  Procedure Laterality Date  . Partial hysterectomy      due to endometriosis  . Tubal ligation    . Cardiac catheterization      06/2001, 02/2002, 10/2004 (10-15% proximal stenosis of circumflex, diffuse disease of  OM1 and RCA)   Family History  Problem Relation Age of Onset  . Mental  illness Mother   . Heart disease Father   . Diabetes Sister   . Diabetes Sister   . Diabetes Sister    Social History  Substance Use Topics  . Smoking status: Never Smoker   . Smokeless tobacco: None  . Alcohol Use: No   OB History    No data available     Review of Systems  Musculoskeletal: Positive for arthralgias and gait problem.  Neurological: Negative for numbness.    Allergies  Ace inhibitors; Lexiscan; Peanuts; Hydromorphone hcl; Percocet; Propoxyphene n-acetaminophen; and Vicodin  Home Medications   Prior to Admission medications   Medication Sig Start Date End Date Taking? Authorizing Provider  acetaminophen (TYLENOL) 500 MG tablet Take 1,000 mg by mouth every 6 (six) hours as needed for moderate pain.    Historical Provider, MD  amLODipine (NORVASC) 10 MG tablet TAKE 1 TABLET (10 MG TOTAL) BY MOUTH DAILY. 08/21/14   Inez Catalina, MD  atorvastatin (LIPITOR) 40 MG tablet TAKE 1 TABLET (40 MG TOTAL) BY MOUTH DAILY. 08/21/14   Inez Catalina, MD  b complex vitamins tablet Take 1 tablet by mouth daily. Patient not taking: Reported on 05/18/2014 01/06/14   Inez Catalina, MD  buPROPion (WELLBUTRIN XL) 150 MG 24 hr tablet Take 1 tablet (150 mg  total) by mouth every morning. 08/11/14   Inez Catalina, MD  famotidine (PEPCID) 20 MG tablet Take 1 tablet (20 mg total) by mouth 2 (two) times daily. 08/11/14   Inez Catalina, MD  fluticasone [facility]) 50 MCG/ACT nasal spray INSTILL TWO   SPRAYS IN EACH NOSTRIL ONCE DAILY 06/18/14   Burns Spain, MD  fluticasone (FLOVENT HFA) 44 MCG/ACT inhaler Inhale 2 puffs into the lungs 2 (two) times daily. Patient not taking: Reported on 05/18/2014 06/02/13   Pasty Spillers McLean-Scocozza, MD  furosemide (LASIX) 20 MG tablet Take 2 tablets (40 mg total) by mouth daily. 10/13/14   Inez Catalina, MD  losartan (COZAAR) 100 MG tablet TAKE 1 TABLET (100 MG TOTAL) BY MOUTH TWO   TIMES DAILY. Patient taking differently: TAKE 1 TABLET (100 MG TOTAL) BY  MOUTH DAILY 03/02/14   Inez Catalina, MD  metoprolol (LOPRESSOR) 50 MG tablet Take 1 tablet (50 mg total) by mouth daily. 05/12/14 05/12/15  Inez Catalina, MD  nitroGLYCERIN (NITROSTAT) 0.4 MG SL tablet Place 0.4 mg under the tongue every 5 (five) minutes as needed for chest pain.    Historical Provider, MD  oxyCODONE-acetaminophen (PERCOCET) 10-325 MG per tablet Take 1 tablet by mouth every 8 (eight) hours as needed for pain. 10/13/14 10/03/15  Inez Catalina, MD  predniSONE (DELTASONE) 50 MG tablet Take 1 tablet (50 mg total) by mouth daily with breakfast. 10/13/14   Inez Catalina, MD  rivaroxaban (XARELTO) 20 MG TABS tablet TAKE 1 TABLET (20 MG TOTAL) BY MOUTH DAILY WITH SUPPER. Patient taking differently: Take 20 mg by mouth daily.  05/05/14   Inez Catalina, MD  TOVIAZ 8 MG TB24 Take 8 mg by mouth Daily. 11/01/11   Historical Provider, MD  traZODone (DESYREL) 50 MG tablet TAKE 0.5 TABLETS (25 MG TOTAL) BY MOUTH AT BEDTIME. 06/18/14   Burns Spain, MD   BP 125/84 mmHg  Pulse 89  Temp(Src) 98 F (36.7 C) (Oral)  Resp 16  Ht 5\' 2"  (1.575 m)  Wt 234 lb (106.142 kg)  BMI 42.79 kg/m2  SpO2 98% Physical Exam  Constitutional: She is oriented to person, place, and time. She appears well-developed and well-nourished. No distress.  HENT:  Head: Normocephalic and atraumatic.  Eyes: Conjunctivae and EOM are normal.  Neck: Neck supple. No tracheal deviation present.  Cardiovascular: Normal rate.   Pulmonary/Chest: Effort normal. No respiratory distress.  Musculoskeletal:  Bilateral lower extremity edema, 1+. Tenderness to palpation over lateral right hip. Pain with any range of motion at the hip joint including flexion, internal/external rotation. Normal knee and ankle. Dorsal pedal pulses intact.  Neurological: She is alert and oriented to person, place, and time.  5/5 and equal lower extremity strength. 2+ and equal patellar reflexes bilaterally. Pt able to dorsiflex bilateral toes and feet with  good strength against resistance. Equal sensation bilaterally over thighs and lower legs.   Skin: Skin is warm and dry.  Psychiatric: She has a normal mood and affect. Her behavior is normal.  Nursing note and vitals reviewed.   ED Course  Procedures   DIAGNOSTIC STUDIES: Oxygen Saturation is 98% on RA, normal by my interpretation.    COORDINATION OF CARE: 9:03 PM Discussed treatment plan with pt which includes pain management in the ED. Pt agreed to plan.   Labs Review Labs Reviewed - No data to display  Imaging Review No results found.   EKG Interpretation None  MDM   Final diagnoses:  Right hip pain   Pt with right hip pain. Pain is chronic. Severe arthritis, previous x-rays. She is waiting to get in to see orthopedics doctor to discuss hip replacement. Patient states she has pain medications at the pharmacy but unable to fill them because this too soon for refill. No new injuries. Neurovascularly intact. Vital signs are normal. I do not think she has septic hip based on exam and history. I have given her 30 mg of Toradol IM and one Percocet. We'll reassess her pain.   10:11 PM Patient is feeling better. She'll be discharged home. She'll needs to call her primary care doctor for further pain management.  Filed Vitals:   10/30/14 2029  BP: 125/84  Pulse: 89  Temp: 98 F (36.7 C)  TempSrc: Oral  Resp: 16  Height: 5\' 2"  (1.575 m)  Weight: 234 lb (106.142 kg)  SpO2: 98%    I personally performed the services described in this documentation, which was scribed in my presence. The recorded information has been reviewed and is accurate.   Jaynie Crumble, PA-C 10/30/14 2216  Lyndal Pulley, MD 10/31/14 1515

## 2014-10-30 NOTE — ED Notes (Signed)
Patient here with complaint of right hip pain. States onset today while [place]. Reports history of problems with that hip and is supposed to see ortho soon for possible joint replacement consult. Has been taking tylenol for the pain without relief.

## 2014-11-01 ENCOUNTER — Emergency Department (HOSPITAL_COMMUNITY)
Admission: EM | Admit: 2014-11-01 | Discharge: 2014-11-01 | Disposition: A | Discharge status: Home / Self Care | Payer: Medicare Other | Attending: Emergency Medicine | Admitting: Emergency Medicine

## 2014-11-01 ENCOUNTER — Encounter (HOSPITAL_COMMUNITY): Payer: Self-pay

## 2014-11-01 DIAGNOSIS — J45901 Unspecified asthma with (acute) exacerbation: Secondary | ICD-10-CM | POA: Diagnosis not present

## 2014-11-01 DIAGNOSIS — E785 Hyperlipidemia, unspecified: Secondary | ICD-10-CM | POA: Insufficient documentation

## 2014-11-01 DIAGNOSIS — Y9389 Activity, other specified: Secondary | ICD-10-CM | POA: Insufficient documentation

## 2014-11-01 DIAGNOSIS — Z7951 Long term (current) use of inhaled steroids: Secondary | ICD-10-CM | POA: Insufficient documentation

## 2014-11-01 DIAGNOSIS — F419 Anxiety disorder, unspecified: Secondary | ICD-10-CM | POA: Insufficient documentation

## 2014-11-01 DIAGNOSIS — I509 Heart failure, unspecified: Secondary | ICD-10-CM | POA: Insufficient documentation

## 2014-11-01 DIAGNOSIS — Z79899 Other long term (current) drug therapy: Secondary | ICD-10-CM | POA: Diagnosis not present

## 2014-11-01 DIAGNOSIS — Y9289 Other specified places as the place of occurrence of the external cause: Secondary | ICD-10-CM | POA: Insufficient documentation

## 2014-11-01 DIAGNOSIS — X58XXXA Exposure to other specified factors, initial encounter: Secondary | ICD-10-CM | POA: Diagnosis not present

## 2014-11-01 DIAGNOSIS — Z8739 Personal history of other diseases of the musculoskeletal system and connective tissue: Secondary | ICD-10-CM | POA: Insufficient documentation

## 2014-11-01 DIAGNOSIS — G4733 Obstructive sleep apnea (adult) (pediatric): Secondary | ICD-10-CM | POA: Insufficient documentation

## 2014-11-01 DIAGNOSIS — I2510 Atherosclerotic heart disease of native coronary artery without angina pectoris: Secondary | ICD-10-CM | POA: Diagnosis not present

## 2014-11-01 DIAGNOSIS — N183 Chronic kidney disease, stage 3 (moderate): Secondary | ICD-10-CM | POA: Diagnosis not present

## 2014-11-01 DIAGNOSIS — E669 Obesity, unspecified: Secondary | ICD-10-CM | POA: Insufficient documentation

## 2014-11-01 DIAGNOSIS — F329 Major depressive disorder, single episode, unspecified: Secondary | ICD-10-CM | POA: Diagnosis not present

## 2014-11-01 DIAGNOSIS — Z86718 Personal history of other venous thrombosis and embolism: Secondary | ICD-10-CM | POA: Diagnosis not present

## 2014-11-01 DIAGNOSIS — Y998 Other external cause status: Secondary | ICD-10-CM | POA: Diagnosis not present

## 2014-11-01 DIAGNOSIS — I129 Hypertensive chronic kidney disease with stage 1 through stage 4 chronic kidney disease, or unspecified chronic kidney disease: Secondary | ICD-10-CM | POA: Diagnosis not present

## 2014-11-01 DIAGNOSIS — T783XXA Angioneurotic edema, initial encounter: Secondary | ICD-10-CM

## 2014-11-01 DIAGNOSIS — R220 Localized swelling, mass and lump, head: Secondary | ICD-10-CM | POA: Diagnosis present

## 2014-11-01 MED ORDER — DIPHENHYDRAMINE HCL 50 MG/ML IJ SOLN
INTRAMUSCULAR | Status: AC
  Filled 2014-11-01: qty 1

## 2014-11-01 MED ORDER — METHYLPREDNISOLONE SODIUM SUCC 125 MG IJ SOLR
125.0000 mg | Freq: Once | INTRAMUSCULAR | Status: AC
  Administered 2014-11-01: 125 mg via INTRAVENOUS

## 2014-11-01 MED ORDER — ONDANSETRON HCL 4 MG/2ML IJ SOLN
4.0000 mg | Freq: Once | INTRAMUSCULAR | Status: AC
  Administered 2014-11-01: 4 mg via INTRAVENOUS
  Filled 2014-11-01: qty 2

## 2014-11-01 MED ORDER — EPINEPHRINE 0.3 MG/0.3ML IJ SOAJ
INTRAMUSCULAR | Status: AC
  Filled 2014-11-01: qty 0.3

## 2014-11-01 MED ORDER — FAMOTIDINE IN NACL 20-0.9 MG/50ML-% IV SOLN
20.0000 mg | Freq: Once | INTRAVENOUS | Status: AC
  Administered 2014-11-01: 20 mg via INTRAVENOUS
  Filled 2014-11-01: qty 50

## 2014-11-01 MED ORDER — FAMOTIDINE IN NACL 20-0.9 MG/50ML-% IV SOLN: 20.0000 mg | Freq: Once | INTRAVENOUS | Status: DC

## 2014-11-01 MED ORDER — RANITIDINE HCL 150 MG PO TABS: 150.0000 mg | ORAL_TABLET | Freq: Two times a day (BID) | ORAL | Status: DC

## 2014-11-01 MED ORDER — PREDNISONE 10 MG PO TABS: 50.0000 mg | ORAL_TABLET | Freq: Every day | ORAL | Status: DC

## 2014-11-01 MED ORDER — DIPHENHYDRAMINE HCL 50 MG/ML IJ SOLN
50.0000 mg | Freq: Once | INTRAMUSCULAR | Status: AC
  Administered 2014-11-01: 50 mg via INTRAVENOUS

## 2014-11-01 MED ORDER — METHYLPREDNISOLONE SODIUM SUCC 125 MG IJ SOLR
INTRAMUSCULAR | Status: DC
Start: 2014-11-01 — End: 2014-11-02
  Filled 2014-11-01: qty 2

## 2014-11-01 MED ORDER — EPINEPHRINE 0.3 MG/0.3ML IJ SOAJ
0.3000 mg | Freq: Once | INTRAMUSCULAR | Status: AC
  Administered 2014-11-01: 0.3 mg via INTRAMUSCULAR

## 2014-11-01 NOTE — ED Notes (Signed)
Patient brought back to room via wheelchair; patient undressed, in gown, on monitor, continuous pulse oximetry and blood pressure cuff 

## 2014-11-01 NOTE — ED Notes (Signed)
Pt. Began to vomit, all over the floor. Pt. Given a emesis basin , and supporting pt. While she is vomiting.

## 2014-11-01 NOTE — Discharge Instructions (Signed)
Angioedema °Angioedema is a sudden swelling of tissues, often of the skin. It can occur on the face or genitals or in the abdomen or other body parts. The swelling usually develops over a short period and gets better in 24 to 48 hours. It often begins during the night and is found when the person wakes up. The person may also get red, itchy patches of skin (hives). Angioedema can be dangerous if it involves swelling of the air passages.  °Depending on the cause, episodes of angioedema may only happen once, come back in unpredictable patterns, or repeat for several years and then gradually fade away.  °CAUSES  °Angioedema can be caused by an allergic reaction to various triggers. It can also result from nonallergic causes, including reactions to drugs, immune system disorders, viral infections, or an abnormal gene that is passed to you from your parents (hereditary). For some people with angioedema, the cause is unknown.  °Some things that can trigger angioedema include:  °· Foods.   °· Medicines, such as ACE inhibitors, ARBs, nonsteroidal anti-inflammatory agents, or estrogen.   °· Latex.   °· Animal saliva.   °· Insect stings.   °· Dyes used in X-rays.   °· Mild injury.   °· Dental work. °· Surgery. °· Stress.   °· Sudden changes in temperature.   °· Exercise. °SIGNS AND SYMPTOMS  °· Swelling of the skin. °· Hives. If these are present, there is also intense itching. °· Redness in the affected area.   °· Pain in the affected area. °· Swollen lips or tongue. °· Breathing problems. This may happen if the air passages swell. °· Wheezing. °If internal organs are involved, there may be:  °· Nausea.   °· Abdominal pain.   °· Vomiting.   °· Difficulty swallowing.   °· Difficulty passing urine. °DIAGNOSIS  °· Your health care provider will examine the affected area and take a medical and family history. °· Various tests may be done to help determine the cause. Tests may include: °¨ Allergy skin tests to see if the problem  is an allergic reaction.   °¨ Blood tests to check for hereditary angioedema.   °¨ Tests to check for underlying diseases that could cause the condition.   °· A review of your medicines, including over-the-counter medicines, may be done. °TREATMENT  °Treatment will depend on the cause of the angioedema. Possible treatments include:  °· Removal of anything that triggered the condition (such as stopping certain medicines).   °· Medicines to treat symptoms or prevent attacks. Medicines given may include:   °¨ Antihistamines.   °¨ Epinephrine injection.   °¨ Steroids.   °· Hospitalization may be required for severe attacks. If the air passages are affected, it can be an emergency. Tubes may need to be placed to keep the airway open. °HOME CARE INSTRUCTIONS  °· Take all medicines as directed by your health care provider. °· If you were given medicines for emergency allergy treatment, always carry them with you. °· Wear a medical bracelet as directed by your health care provider.   °· Avoid known triggers. °SEEK MEDICAL CARE IF:  °· You have repeat attacks of angioedema.   °· Your attacks are more frequent or more severe despite preventive measures.   °· You have hereditary angioedema and are considering having children. It is important to discuss with your health care provider the risks of passing the condition on to your children. °SEEK IMMEDIATE MEDICAL CARE IF:  °· You have severe swelling of the mouth, tongue, or lips. °· You have difficulty breathing.   °· You have difficulty swallowing.   °· You faint. °MAKE   SURE YOU: °· Understand these instructions. °· Will watch your condition. °· Will get help right away if you are not doing well or get worse. °Document Released: 04/23/2001 Document Revised: 06/29/2013 Document Reviewed: 10/06/2012 °[employer]® Patient Information ©2015 [employer]. This information is not intended to replace advice given to you by your health care provider. Make sure you discuss any questions  you have with your health care provider. ° °

## 2014-11-01 NOTE — ED Notes (Signed)
Pt. Reports having swelling to her lips .  Difficulty talking maintaining her saliva  . No sob noted.  Airway patent

## 2014-11-01 NOTE — ED Provider Notes (Signed)
CSN: [id number]     Arrival date & time 11/01/14  1806 History   First MD Initiated Contact with Patient 11/01/14 1811     Chief Complaint  Patient presents with  . Angioedema   Patient is a 64 y.o. female presenting with general illness. The history is provided by the patient. No language interpreter was used.  Illness Location:  Lips Quality:  Swelling Severity:  Severe Onset quality:  Gradual Timing:  Constant Progression:  Worsening Chronicity:  Recurrent Context:  PMHx of HTN, HLD, Gout, OSA, CAD, CHF, CKD, and previous Hx of angioedema with ACEI presenting today with lip swelling. Patient states that approximately 4 PM today she noticed mild swelling of her lower lip which has progressively worsened until presentation. Patient also notes nausea and scratching sensation in back of throat. Associated symptoms: nausea and sore throat (scratching sensation)   Associated symptoms: no abdominal pain, no chest pain, no cough, no fever, no shortness of breath and no vomiting     Past Medical History  Diagnosis Date  . Hyperlipidemia   . Hypertension   . Gout   . Obesity   . Depression   . DJD (degenerative joint disease)   . OSA (obstructive sleep apnea)     previously used CPAP, has lost  . CAD (coronary artery disease)     s/p non-Q wave MI in 06/2001 and 2004, 2005  . ALCOHOL ABUSE 12/13/2005    Annotation: Sober since 11/06 Qualifier: Diagnosis of  By: Wallace Cullens MD, Natalia Leatherwood    . INTRINSIC ASTHMA, WITH EXACERBATION 09/27/2009    Qualifier: Diagnosis of  By: Denton Meek MD, Tillie Rung    . Chronic kidney disease (CKD), stage III (moderate) 09/19/2010  . CHF (congestive heart failure)   . DVT of lower extremity, bilateral 04/04/2012    On Xarelto     Past Surgical History  Procedure Laterality Date  . Partial hysterectomy      due to endometriosis  . Tubal ligation    . Cardiac catheterization      06/2001, 02/2002, 10/2004 (10-15% proximal stenosis of circumflex, diffuse disease of  OM1  and RCA)   Family History  Problem Relation Age of Onset  . Mental illness Mother   . Heart disease Father   . Diabetes Sister   . Diabetes Sister   . Diabetes Sister    Social History  Substance Use Topics  . Smoking status: Never Smoker   . Smokeless tobacco: None  . Alcohol Use: No   OB History    No data available      Review of Systems  Constitutional: Negative for fever.  HENT: Positive for facial swelling and sore throat (scratching sensation).   Respiratory: Negative for cough and shortness of breath.   Cardiovascular: Negative for chest pain.  Gastrointestinal: Positive for nausea. Negative for vomiting and abdominal pain.  All other systems reviewed and are negative.   Allergies  Ace inhibitors; Lexiscan; Peanuts; Hydromorphone hcl; Percocet; Propoxyphene n-acetaminophen; and Vicodin  Home Medications   Prior to Admission medications   Medication Sig Start Date End Date Taking? Authorizing Provider  nitroGLYCERIN (NITROSTAT) 0.4 MG SL tablet Place 0.4 mg under the tongue every 5 (five) minutes as needed for chest pain.   Yes Historical Provider, MD  acetaminophen (TYLENOL) 500 MG tablet Take 1,000 mg by mouth every 6 (six) hours as needed for moderate pain.    Historical Provider, MD  amLODipine (NORVASC) 10 MG tablet TAKE 1 TABLET (10 MG TOTAL) BY  MOUTH DAILY. 08/21/14   Inez Catalina, MD  atorvastatin (LIPITOR) 40 MG tablet TAKE 1 TABLET (40 MG TOTAL) BY MOUTH DAILY. 08/21/14   Inez Catalina, MD  b complex vitamins tablet Take 1 tablet by mouth daily. Patient not taking: Reported on 05/18/2014 01/06/14   Inez Catalina, MD  buPROPion (WELLBUTRIN XL) 150 MG 24 hr tablet Take 1 tablet (150 mg total) by mouth every morning. 08/11/14   Inez Catalina, MD  famotidine (PEPCID) 20 MG tablet Take 1 tablet (20 mg total) by mouth 2 (two) times daily. 08/11/14   Inez Catalina, MD  fluticasone [facility]) 50 MCG/ACT nasal spray INSTILL TWO   SPRAYS IN EACH NOSTRIL ONCE DAILY  06/18/14   Burns Spain, MD  fluticasone (FLOVENT HFA) 44 MCG/ACT inhaler Inhale 2 puffs into the lungs 2 (two) times daily. Patient not taking: Reported on 05/18/2014 06/02/13   Pasty Spillers McLean-Scocozza, MD  furosemide (LASIX) 20 MG tablet Take 2 tablets (40 mg total) by mouth daily. 10/13/14   Inez Catalina, MD  metoprolol (LOPRESSOR) 50 MG tablet Take 1 tablet (50 mg total) by mouth daily. 05/12/14 05/12/15  Inez Catalina, MD  oxyCODONE-acetaminophen (PERCOCET) 10-325 MG per tablet Take 1 tablet by mouth every 8 (eight) hours as needed for pain. 10/13/14 10/03/15  Inez Catalina, MD  predniSONE (DELTASONE) 10 MG tablet Take 5 tablets (50 mg total) by mouth daily. 11/01/14   Angelina Ok, MD  ranitidine (ZANTAC) 150 MG tablet Take 1 tablet (150 mg total) by mouth 2 (two) times daily. 11/01/14   Angelina Ok, MD  rivaroxaban (XARELTO) 20 MG TABS tablet TAKE 1 TABLET (20 MG TOTAL) BY MOUTH DAILY WITH SUPPER. Patient taking differently: Take 20 mg by mouth daily.  05/05/14   Inez Catalina, MD  TOVIAZ 8 MG TB24 Take 8 mg by mouth Daily. 11/01/11   Historical Provider, MD  traZODone (DESYREL) 50 MG tablet TAKE 0.5 TABLETS (25 MG TOTAL) BY MOUTH AT BEDTIME. 06/18/14   Burns Spain, MD   BP 129/71 mmHg  Pulse 86  Temp(Src) 98.6 F (37 C) (Oral)  Resp 23  Ht 5\' 4"  (1.626 m)  Wt 234 lb (106.142 kg)  BMI 40.15 kg/m2  SpO2 100%   Physical Exam  Constitutional: She is oriented to person, place, and time. She appears well-developed and well-nourished. She appears distressed (anxious appearing).  HENT:  Head: Normocephalic and atraumatic.  Angioedema of both lips most low notable to lower, posterior oropharynx without obvious erythema or edema  Eyes: Conjunctivae are normal. Pupils are equal, round, and reactive to light.  Neck: Normal range of motion. Neck supple. No tracheal deviation present.  Cardiovascular: Normal rate, regular rhythm and intact distal pulses.   Pulmonary/Chest: Effort  normal and breath sounds normal. No respiratory distress. She has no wheezes.  Abdominal: Soft. Bowel sounds are normal. She exhibits no distension. There is no tenderness. There is no rebound.  Musculoskeletal: Normal range of motion.  Neurological: She is alert and oriented to person, place, and time.  Skin: Skin is warm and dry. No rash noted. She is not diaphoretic.    ED Course  Procedures   Labs Review Labs Reviewed - No data to display  Imaging Review No results found. I have personally reviewed and evaluated these images and lab results as part of my medical decision-making.   EKG Interpretation   Date/Time:  Monday November 01 2014 18:18:53 EDT Ventricular Rate:  94 PR  Interval:  162 QRS Duration: 76 QT Interval:  350 QTC Calculation: 438 R Axis:   48 Text Interpretation:  Sinus rhythm Borderline T wave abnormalities When  compared with ECG of 09/02/2014, No significant change was found Confirmed  by [facility]  MD, DAVID ([id number]) on 11/01/2014 7:10:01 PM      MDM  Ms. Mccance is a 64 yo female w/ PMHx of HTN, HLD, Gout, OSA, CAD, CHF, CKD, and previous Hx of angioedema with ACEI presenting today with lip swelling. Patient states that approximately 4 PM today she noticed mild swelling of her lower lip which has progressively worsened until presentation. Patient also notes nausea and scratching sensation in back of throat. Denies rash, sensation of throat swelling, wheeze, or emesis.   Exam above notable for elderly female lying in stretcher in moderate distress secondary to swelling. Afebrile. Tachycardic to low 100s. Tachypneic to low 20s. Normotensive. Breathing well on room air and maintaining saturations without supplemental oxygen. Large amount of edema to upper and lower lips. Lungs clear to auscultation bilaterally posterior oropharynx without obvious edema or erythema.  Epinephrine pen administered. Patient also given slight Medrol IV, Benadryl IV, and famotidine IV.  Patient monitored in the emergency department for 3-4 hours without any evidence of rebound symptoms. Patient feeling much better.  Patient discharged home in stable condition with a prescription for a prednisone burst as well as famotidine. Patient advised to stop taking her losartan and follow-up with her PCP discuss an alternative BP medication. Strict ED return cautions discussed. Patient understands and agrees with plan and has no further questions or concerns time.  Patient care discussed with and followed by my attending, Dr. Dione Booze  Final diagnoses:  Angioedema, initial encounter    Angelina Ok, MD 11/02/14 6045  Dione Booze, MD 11/02/14 2259

## 2014-11-02 ENCOUNTER — Other Ambulatory Visit (HOSPITAL_COMMUNITY): Payer: Self-pay | Admitting: Orthopaedic Surgery

## 2014-11-02 ENCOUNTER — Other Ambulatory Visit: Payer: Self-pay | Admitting: Internal Medicine

## 2014-11-02 DIAGNOSIS — M1611 Unilateral primary osteoarthritis, right hip: Secondary | ICD-10-CM | POA: Diagnosis not present

## 2014-11-02 DIAGNOSIS — M2550 Pain in unspecified joint: Secondary | ICD-10-CM

## 2014-11-02 DIAGNOSIS — M25551 Pain in right hip: Secondary | ICD-10-CM | POA: Diagnosis not present

## 2014-11-02 DIAGNOSIS — F1010 Alcohol abuse, uncomplicated: Secondary | ICD-10-CM | POA: Diagnosis not present

## 2014-11-02 NOTE — Telephone Encounter (Signed)
Patient requesting her Percocet refill. °

## 2014-11-02 NOTE — Telephone Encounter (Signed)
Last filled at [facility] 10/08/2014 Last office visit 10/13/2014, will schedule oct appt when she picks up script Last UDS 12/2013

## 2014-11-03 DIAGNOSIS — R3989 Other symptoms and signs involving the genitourinary system: Secondary | ICD-10-CM | POA: Diagnosis not present

## 2014-11-03 DIAGNOSIS — N3281 Overactive bladder: Secondary | ICD-10-CM | POA: Diagnosis not present

## 2014-11-04 MED ORDER — OXYCODONE-ACETAMINOPHEN 10-325 MG PO TABS: 1.0000 | ORAL_TABLET | Freq: Three times a day (TID) | ORAL | Status: DC | PRN

## 2014-11-04 NOTE — Telephone Encounter (Signed)
Pt informed Rx is ready 

## 2014-11-08 DIAGNOSIS — R609 Edema, unspecified: Secondary | ICD-10-CM | POA: Diagnosis not present

## 2014-11-08 DIAGNOSIS — M1990 Unspecified osteoarthritis, unspecified site: Secondary | ICD-10-CM | POA: Diagnosis not present

## 2014-11-08 DIAGNOSIS — J45901 Unspecified asthma with (acute) exacerbation: Secondary | ICD-10-CM | POA: Diagnosis not present

## 2014-11-08 DIAGNOSIS — R32 Unspecified urinary incontinence: Secondary | ICD-10-CM | POA: Diagnosis not present

## 2014-11-09 DIAGNOSIS — Z5181 Encounter for therapeutic drug level monitoring: Secondary | ICD-10-CM | POA: Diagnosis not present

## 2014-11-09 DIAGNOSIS — F1010 Alcohol abuse, uncomplicated: Secondary | ICD-10-CM | POA: Diagnosis not present

## 2014-11-09 DIAGNOSIS — F1920 Other psychoactive substance dependence, uncomplicated: Secondary | ICD-10-CM | POA: Diagnosis not present

## 2014-11-11 ENCOUNTER — Other Ambulatory Visit (HOSPITAL_COMMUNITY): Payer: Self-pay | Admitting: *Deleted

## 2014-11-11 ENCOUNTER — Encounter (HOSPITAL_COMMUNITY)
Admission: RE | Admit: 2014-11-11 | Discharge: 2014-11-11 | Disposition: A | Discharge status: Home / Self Care | Payer: Medicare Other | Source: Ambulatory Visit | Attending: Orthopaedic Surgery | Admitting: Orthopaedic Surgery

## 2014-11-11 ENCOUNTER — Encounter (HOSPITAL_COMMUNITY): Payer: Self-pay

## 2014-11-11 DIAGNOSIS — Z01818 Encounter for other preprocedural examination: Secondary | ICD-10-CM | POA: Insufficient documentation

## 2014-11-11 DIAGNOSIS — G4733 Obstructive sleep apnea (adult) (pediatric): Secondary | ICD-10-CM | POA: Insufficient documentation

## 2014-11-11 DIAGNOSIS — I129 Hypertensive chronic kidney disease with stage 1 through stage 4 chronic kidney disease, or unspecified chronic kidney disease: Secondary | ICD-10-CM | POA: Diagnosis not present

## 2014-11-11 DIAGNOSIS — Z01812 Encounter for preprocedural laboratory examination: Secondary | ICD-10-CM | POA: Insufficient documentation

## 2014-11-11 DIAGNOSIS — Z86718 Personal history of other venous thrombosis and embolism: Secondary | ICD-10-CM | POA: Diagnosis not present

## 2014-11-11 DIAGNOSIS — I252 Old myocardial infarction: Secondary | ICD-10-CM | POA: Diagnosis not present

## 2014-11-11 DIAGNOSIS — F1920 Other psychoactive substance dependence, uncomplicated: Secondary | ICD-10-CM | POA: Diagnosis not present

## 2014-11-11 DIAGNOSIS — I2510 Atherosclerotic heart disease of native coronary artery without angina pectoris: Secondary | ICD-10-CM | POA: Insufficient documentation

## 2014-11-11 DIAGNOSIS — N183 Chronic kidney disease, stage 3 (moderate): Secondary | ICD-10-CM | POA: Diagnosis not present

## 2014-11-11 DIAGNOSIS — Z7902 Long term (current) use of antithrombotics/antiplatelets: Secondary | ICD-10-CM | POA: Diagnosis not present

## 2014-11-11 DIAGNOSIS — Z5181 Encounter for therapeutic drug level monitoring: Secondary | ICD-10-CM | POA: Diagnosis not present

## 2014-11-11 DIAGNOSIS — Z79899 Other long term (current) drug therapy: Secondary | ICD-10-CM | POA: Insufficient documentation

## 2014-11-11 DIAGNOSIS — K219 Gastro-esophageal reflux disease without esophagitis: Secondary | ICD-10-CM | POA: Insufficient documentation

## 2014-11-11 DIAGNOSIS — I509 Heart failure, unspecified: Secondary | ICD-10-CM | POA: Insufficient documentation

## 2014-11-11 DIAGNOSIS — J45909 Unspecified asthma, uncomplicated: Secondary | ICD-10-CM | POA: Insufficient documentation

## 2014-11-11 DIAGNOSIS — M1611 Unilateral primary osteoarthritis, right hip: Secondary | ICD-10-CM | POA: Diagnosis not present

## 2014-11-11 DIAGNOSIS — F1010 Alcohol abuse, uncomplicated: Secondary | ICD-10-CM | POA: Diagnosis not present

## 2014-11-11 HISTORY — DX: Unspecified convulsions: R56.9

## 2014-11-11 HISTORY — DX: Anemia, unspecified: D64.9

## 2014-11-11 HISTORY — DX: Gastro-esophageal reflux disease without esophagitis: K21.9

## 2014-11-11 LAB — SURGICAL PCR SCREEN
MRSA, PCR: NEGATIVE
STAPHYLOCOCCUS AUREUS: NEGATIVE

## 2014-11-11 LAB — BASIC METABOLIC PANEL
ANION GAP: 10 (ref 5–15)
BUN: 20 mg/dL (ref 6–20)
CHLORIDE: 100 mmol/L — AB (ref 101–111)
CO2: 26 mmol/L (ref 22–32)
Calcium: 8.4 mg/dL — ABNORMAL LOW (ref 8.9–10.3)
Creatinine, Ser: 1.49 mg/dL — ABNORMAL HIGH (ref 0.44–1.00)
GFR calc Af Amer: 43 mL/min — ABNORMAL LOW (ref >60)
GFR, EST NON AFRICAN AMERICAN: 37 mL/min — AB (ref >60)
GLUCOSE: 102 mg/dL — AB (ref 65–99)
POTASSIUM: 3.9 mmol/L (ref 3.5–5.1)
SODIUM: 136 mmol/L (ref 135–145)

## 2014-11-11 LAB — CBC
HEMATOCRIT: 37.1 % (ref 36.0–46.0)
HEMOGLOBIN: 12.2 g/dL (ref 12.0–15.0)
MCH: 30.2 pg (ref 26.0–34.0)
MCHC: 32.9 g/dL (ref 30.0–36.0)
MCV: 91.8 fL (ref 78.0–100.0)
Platelets: 214 10*3/uL (ref 150–400)
RBC: 4.04 MIL/uL (ref 3.87–5.11)
RDW: 13.8 % (ref 11.5–15.5)
WBC: 5.8 10*3/uL (ref 4.0–10.5)

## 2014-11-11 NOTE — Pre-Procedure Instructions (Signed)
Vanessa Palmer  11/11/2014     Your procedure is scheduled on Tuesday, November 23, 2014 at 3:30 PM.   Report to [facility] Entrance "A" Admitting Office at 1:30 PM.   Call this number if you have problems the morning of surgery: [phone]   Any questions prior to day of surgery, please call [phone] between 8 & 4 PM.    Remember:  Do not eat food or drink liquids after midnight Monday, 11/22/14.  Take these medicines the morning of surgery with A SIP OF WATER: Amlodipine (Norvasc), Bupropion (Wellbutrin), Famotidine (Pepcid), Metoprolol (Lopressor), Prednisone, Ranitidine (Zantac), Toviaz, Flovent inhaler, Oxycodone or Tylenol - if needed, Flonase nasal spray - if needed  Stop Xarelto 5 days prior to surgery as instructed by Dr. [name].   Do not wear jewelry, make-up or nail polish.  Do not wear lotions, powders, or perfumes.  You may wear deodorant.  Do not shave 48 hours prior to surgery.    Do not bring valuables to the hospital.  [facility] is not responsible for any belongings or valuables.  Contacts, dentures or bridgework may not be worn into surgery.  Leave your suitcase in the car.  After surgery it may be brought to your room.  For patients admitted to the hospital, discharge time will be determined by your treatment team.  Special instructions: [facility] - Preparing for Surgery  Before surgery, you can play an important role.  Because skin is not sterile, your skin needs to be as free of germs as possible.  You can reduce the number of germs on you skin by washing with CHG (chlorahexidine gluconate) soap before surgery.  CHG is an antiseptic cleaner which kills germs and bonds with the skin to continue killing germs even after washing.  Please DO NOT use if you have an allergy to CHG or antibacterial soaps.  If your skin becomes reddened/irritated stop using the CHG and inform your nurse when you arrive at Short Stay.  Do not shave (including  legs and underarms) for at least 48 hours prior to the first CHG shower.  You may shave your face.  Please follow these instructions carefully:   1.  Shower with CHG Soap the night before surgery and the                                morning of Surgery.  2.  If you choose to wash your hair, wash your hair first as usual with your       normal shampoo.  3.  After you shampoo, rinse your hair and body thoroughly to remove the                      Shampoo.  4.  Use CHG as you would any other liquid soap.  You can apply chg directly       to the skin and wash gently with scrungie or a clean washcloth.  5.  Apply the CHG Soap to your body ONLY FROM THE NECK DOWN.        Do not use on open wounds or open sores.  Avoid contact with your eyes, ears, mouth and genitals (private parts).  Wash genitals (private parts) with your normal soap.  6.  Wash thoroughly, paying special attention to the area where your surgery        will be performed.  7.  Thoroughly rinse your body with warm water from the neck down.  8.  DO NOT shower/wash with your normal soap after using and rinsing off       the CHG Soap.  9.  Pat yourself dry with a clean towel.            10.  Wear clean pajamas.            11.  Place clean sheets on your bed the night of your first shower and do not        sleep with pets.  Day of Surgery  Do not apply any lotions the morning of surgery.  Please wear clean clothes to the hospital.  Please read over the following fact sheets that you were given. Pain Booklet, Coughing and Deep Breathing, MRSA Information and Surgical Site Infection Prevention

## 2014-11-12 NOTE — Progress Notes (Signed)
Anesthesia Chart Review:  Pt is 64 year old female scheduled for R total hip arthroplasty anterior approach on 11/23/2014 with Dr. Maureen Ralphs.   Cardiologist is Dr. Sharyn Lull, last office visit 09/10/14. PCP is Dr. [name].   PMH includes: CAD (s/p non-Q wave MI in 06/2001 and 2004, 2005), CHF, HTN, OSA, anemia, seizures (as a teenager), DVT (2014), CKD (stage 3), hx alcohol abuse (reportedly sober since 2006), asthma, GERD. Never smoker. BMI 42.   Medications include: amlodipine, lipitor, pepcid, flovent, lasix, metoprolol, xarelto. Pt to stop xarelto 5 days prior to surgery.   Preoperative labs reviewed.  PT/INR to be obtained DOS.   EKG 11/01/2014: Sinus rhythm. Borderline T wave abnormalities.  Nuclear stress test 06/11/2014:  1. No reversible ischemia or infarction. 2. Normal left ventricular wall motion. 3. Left ventricular ejection fraction 66% 4. Low-risk stress test findings  Echo 12/31/2011:  - Left ventricle: The cavity size was normal. Wall thickness was increased in a pattern of mild LVH. Systolic function was normal. The estimated ejection fraction was in the range of 55% to 60%. Wall motion was normal; there were no regional wall motion abnormalities. - Aortic valve: Mild regurgitation. - Mitral valve: Mild regurgitation. - Atrial septum: No defect or patent foramen ovale was identified.  Cardiac cath 03/10/2002 -LV showed good LV systolic function, EF of 55-60%. -Left main was short, which was patent. -LAD was patent. Diagonal #1 was very small, which was patent. Diagonal #2and 3 were small, which were patent. -The left circumflex has 10-20% proximal and mid stenosis. OM-1 is verysmall, which is diffusely diseased. OM-2 is large, which bifurcates intosuperior and inferior branches, which is patent. OM-3 is very small. OM-4is small which is patent. -RCA is small which is patent proximally in midportion but diffusely diseaseddistally, which is small vessel. -The  patient had codominant coronary system.  If no changes, I anticipate pt can proceed with surgery as scheduled.   Rica Mast, FNP-BC [facility] Short Stay Surgical Center/Anesthesiology Phone: [phone] 11/12/2014 4:00 PM

## 2014-11-22 MED ORDER — CEFAZOLIN SODIUM-DEXTROSE 2-3 GM-% IV SOLR
2.0000 g | INTRAVENOUS | Status: AC
  Administered 2014-11-23: 2 g via INTRAVENOUS
  Filled 2014-11-22: qty 50

## 2014-11-22 NOTE — Progress Notes (Signed)
Informed pt. Of time change, she agrees to arrive at 1pm,( 1300) tomorrow- 11/23/2014

## 2014-11-23 ENCOUNTER — Inpatient Hospital Stay (HOSPITAL_COMMUNITY): Payer: Medicare Other

## 2014-11-23 ENCOUNTER — Inpatient Hospital Stay (HOSPITAL_COMMUNITY)
Admission: RE | Admit: 2014-11-23 | Discharge: 2014-11-26 | DRG: 470 | Disposition: A | Discharge status: Skilled Nursing Facility | Payer: Medicare Other | Source: Ambulatory Visit | Attending: Orthopaedic Surgery | Admitting: Orthopaedic Surgery

## 2014-11-23 ENCOUNTER — Inpatient Hospital Stay (HOSPITAL_COMMUNITY): Payer: Medicare Other | Admitting: Anesthesiology

## 2014-11-23 ENCOUNTER — Encounter (HOSPITAL_COMMUNITY)
Admission: RE | Disposition: A | Discharge status: Skilled Nursing Facility | Payer: Self-pay | Source: Ambulatory Visit | Attending: Orthopaedic Surgery

## 2014-11-23 ENCOUNTER — Encounter (HOSPITAL_COMMUNITY): Payer: Self-pay | Admitting: *Deleted

## 2014-11-23 ENCOUNTER — Inpatient Hospital Stay (HOSPITAL_COMMUNITY): Payer: Medicare Other | Admitting: Emergency Medicine

## 2014-11-23 DIAGNOSIS — R2681 Unsteadiness on feet: Secondary | ICD-10-CM | POA: Diagnosis not present

## 2014-11-23 DIAGNOSIS — Z6841 Body Mass Index (BMI) 40.0 and over, adult: Secondary | ICD-10-CM | POA: Diagnosis not present

## 2014-11-23 DIAGNOSIS — I509 Heart failure, unspecified: Secondary | ICD-10-CM | POA: Diagnosis not present

## 2014-11-23 DIAGNOSIS — I2510 Atherosclerotic heart disease of native coronary artery without angina pectoris: Secondary | ICD-10-CM | POA: Diagnosis present

## 2014-11-23 DIAGNOSIS — Z86718 Personal history of other venous thrombosis and embolism: Secondary | ICD-10-CM

## 2014-11-23 DIAGNOSIS — Z96641 Presence of right artificial hip joint: Secondary | ICD-10-CM

## 2014-11-23 DIAGNOSIS — N183 Chronic kidney disease, stage 3 (moderate): Secondary | ICD-10-CM | POA: Diagnosis present

## 2014-11-23 DIAGNOSIS — M169 Osteoarthritis of hip, unspecified: Secondary | ICD-10-CM | POA: Diagnosis not present

## 2014-11-23 DIAGNOSIS — E785 Hyperlipidemia, unspecified: Secondary | ICD-10-CM | POA: Diagnosis not present

## 2014-11-23 DIAGNOSIS — K219 Gastro-esophageal reflux disease without esophagitis: Secondary | ICD-10-CM | POA: Diagnosis present

## 2014-11-23 DIAGNOSIS — F329 Major depressive disorder, single episode, unspecified: Secondary | ICD-10-CM | POA: Diagnosis not present

## 2014-11-23 DIAGNOSIS — M6281 Muscle weakness (generalized): Secondary | ICD-10-CM | POA: Diagnosis not present

## 2014-11-23 DIAGNOSIS — D62 Acute posthemorrhagic anemia: Secondary | ICD-10-CM | POA: Diagnosis not present

## 2014-11-23 DIAGNOSIS — Z419 Encounter for procedure for purposes other than remedying health state, unspecified: Secondary | ICD-10-CM

## 2014-11-23 DIAGNOSIS — M25551 Pain in right hip: Secondary | ICD-10-CM | POA: Diagnosis not present

## 2014-11-23 DIAGNOSIS — M1611 Unilateral primary osteoarthritis, right hip: Secondary | ICD-10-CM | POA: Diagnosis not present

## 2014-11-23 DIAGNOSIS — Z471 Aftercare following joint replacement surgery: Secondary | ICD-10-CM | POA: Diagnosis not present

## 2014-11-23 DIAGNOSIS — I129 Hypertensive chronic kidney disease with stage 1 through stage 4 chronic kidney disease, or unspecified chronic kidney disease: Secondary | ICD-10-CM | POA: Diagnosis not present

## 2014-11-23 DIAGNOSIS — J45909 Unspecified asthma, uncomplicated: Secondary | ICD-10-CM | POA: Diagnosis not present

## 2014-11-23 DIAGNOSIS — R278 Other lack of coordination: Secondary | ICD-10-CM | POA: Diagnosis not present

## 2014-11-23 DIAGNOSIS — G4733 Obstructive sleep apnea (adult) (pediatric): Secondary | ICD-10-CM | POA: Diagnosis not present

## 2014-11-23 HISTORY — PX: TOTAL HIP ARTHROPLASTY: SHX124

## 2014-11-23 LAB — PROTIME-INR
INR: 1.1 (ref 0.00–1.49)
PROTHROMBIN TIME: 14.4 s (ref 11.6–15.2)

## 2014-11-23 SURGERY — ARTHROPLASTY, HIP, TOTAL, ANTERIOR APPROACH
Anesthesia: General | Site: Hip | Laterality: Right

## 2014-11-23 MED ORDER — PROPOFOL 10 MG/ML IV BOLUS
INTRAVENOUS | Status: AC
  Filled 2014-11-23: qty 20

## 2014-11-23 MED ORDER — SODIUM CHLORIDE 0.9 % IR SOLN
Status: DC | PRN
  Administered 2014-11-23: 3000 mL

## 2014-11-23 MED ORDER — FENTANYL CITRATE (PF) 100 MCG/2ML IJ SOLN
25.0000 ug | INTRAMUSCULAR | Status: DC | PRN
Start: 2014-11-23 — End: 2014-11-23
  Administered 2014-11-23 (×3): 50 ug via INTRAVENOUS

## 2014-11-23 MED ORDER — DEXMEDETOMIDINE HCL IN NACL 200 MCG/50ML IV SOLN
INTRAVENOUS | Status: AC
  Filled 2014-11-23: qty 50

## 2014-11-23 MED ORDER — EPHEDRINE SULFATE 50 MG/ML IJ SOLN
INTRAMUSCULAR | Status: AC
  Filled 2014-11-23: qty 1

## 2014-11-23 MED ORDER — FENTANYL CITRATE (PF) 250 MCG/5ML IJ SOLN
INTRAMUSCULAR | Status: AC
  Filled 2014-11-23: qty 5

## 2014-11-23 MED ORDER — RIVAROXABAN 10 MG PO TABS: 10.0000 mg | ORAL_TABLET | Freq: Every day | ORAL | Status: DC

## 2014-11-23 MED ORDER — ONDANSETRON HCL 4 MG PO TABS: 4.0000 mg | ORAL_TABLET | Freq: Four times a day (QID) | ORAL | Status: DC | PRN

## 2014-11-23 MED ORDER — LIDOCAINE HCL (CARDIAC) 20 MG/ML IV SOLN
INTRAVENOUS | Status: AC
  Filled 2014-11-23: qty 5

## 2014-11-23 MED ORDER — FENTANYL CITRATE (PF) 100 MCG/2ML IJ SOLN
INTRAMUSCULAR | Status: AC
  Filled 2014-11-23: qty 2

## 2014-11-23 MED ORDER — METOPROLOL TARTRATE 50 MG PO TABS
50.0000 mg | ORAL_TABLET | Freq: Once | ORAL | Status: AC
  Administered 2014-11-23: 50 mg via ORAL
  Filled 2014-11-23 (×2): qty 1

## 2014-11-23 MED ORDER — ACETAMINOPHEN 325 MG PO TABS: 650.0000 mg | ORAL_TABLET | Freq: Four times a day (QID) | ORAL | Status: DC | PRN

## 2014-11-23 MED ORDER — METOCLOPRAMIDE HCL 5 MG/ML IJ SOLN: 5.0000 mg | Freq: Three times a day (TID) | INTRAMUSCULAR | Status: DC | PRN

## 2014-11-23 MED ORDER — NITROGLYCERIN 0.4 MG SL SUBL: 0.4000 mg | SUBLINGUAL_TABLET | SUBLINGUAL | Status: DC | PRN

## 2014-11-23 MED ORDER — SODIUM CHLORIDE 0.9 % IJ SOLN
INTRAMUSCULAR | Status: AC
  Filled 2014-11-23: qty 10

## 2014-11-23 MED ORDER — SODIUM CHLORIDE 0.9 % IV SOLN
INTRAVENOUS | Status: DC
  Administered 2014-11-23: 22:00:00 via INTRAVENOUS

## 2014-11-23 MED ORDER — LACTATED RINGERS IV SOLN
INTRAVENOUS | Status: DC
  Administered 2014-11-23 (×2): via INTRAVENOUS

## 2014-11-23 MED ORDER — METHOCARBAMOL 500 MG PO TABS
500.0000 mg | ORAL_TABLET | Freq: Four times a day (QID) | ORAL | Status: DC | PRN
  Administered 2014-11-23 – 2014-11-26 (×5): 500 mg via ORAL
  Filled 2014-11-23 (×5): qty 1

## 2014-11-23 MED ORDER — BUPROPION HCL ER (XL) 150 MG PO TB24
150.0000 mg | ORAL_TABLET | Freq: Every day | ORAL | Status: DC
  Administered 2014-11-23 – 2014-11-26 (×4): 150 mg via ORAL
  Filled 2014-11-23 (×4): qty 1

## 2014-11-23 MED ORDER — MIDAZOLAM HCL 5 MG/5ML IJ SOLN
INTRAMUSCULAR | Status: DC | PRN
  Administered 2014-11-23: 2 mg via INTRAVENOUS

## 2014-11-23 MED ORDER — OXYCODONE HCL 5 MG PO TABS
5.0000 mg | ORAL_TABLET | ORAL | Status: DC | PRN
  Administered 2014-11-23 – 2014-11-24 (×5): 10 mg via ORAL
  Administered 2014-11-25: 15 mg via ORAL
  Administered 2014-11-25: 10 mg via ORAL
  Administered 2014-11-26 (×3): 15 mg via ORAL
  Filled 2014-11-23: qty 3
  Filled 2014-11-23: qty 2
  Filled 2014-11-23 (×2): qty 3
  Filled 2014-11-23 (×3): qty 2
  Filled 2014-11-23: qty 3
  Filled 2014-11-23 (×2): qty 2

## 2014-11-23 MED ORDER — PREDNISONE 20 MG PO TABS
50.0000 mg | ORAL_TABLET | Freq: Every day | ORAL | Status: DC
  Administered 2014-11-24 – 2014-11-26 (×3): 50 mg via ORAL
  Filled 2014-11-23 (×2): qty 2
  Filled 2014-11-23: qty 1
  Filled 2014-11-23: qty 2
  Filled 2014-11-23 (×2): qty 1

## 2014-11-23 MED ORDER — GLYCOPYRROLATE 0.2 MG/ML IJ SOLN
INTRAMUSCULAR | Status: AC
  Filled 2014-11-23: qty 4

## 2014-11-23 MED ORDER — METHOCARBAMOL 1000 MG/10ML IJ SOLN
500.0000 mg | Freq: Four times a day (QID) | INTRAVENOUS | Status: DC | PRN
  Filled 2014-11-23: qty 5

## 2014-11-23 MED ORDER — ONDANSETRON HCL 4 MG/2ML IJ SOLN: 4.0000 mg | Freq: Four times a day (QID) | INTRAMUSCULAR | Status: DC | PRN

## 2014-11-23 MED ORDER — FENTANYL CITRATE (PF) 100 MCG/2ML IJ SOLN
INTRAMUSCULAR | Status: DC | PRN
  Administered 2014-11-23: 50 ug via INTRAVENOUS
  Administered 2014-11-23: 150 ug via INTRAVENOUS
  Administered 2014-11-23 (×2): 100 ug via INTRAVENOUS
  Administered 2014-11-23: 150 ug via INTRAVENOUS
  Administered 2014-11-23: 50 ug via INTRAVENOUS
  Administered 2014-11-23: 150 ug via INTRAVENOUS

## 2014-11-23 MED ORDER — NEOSTIGMINE METHYLSULFATE 10 MG/10ML IV SOLN
INTRAVENOUS | Status: DC | PRN
  Administered 2014-11-23: 3 mg via INTRAVENOUS

## 2014-11-23 MED ORDER — PHENOL 1.4 % MT LIQD: 1.0000 | OROMUCOSAL | Status: DC | PRN

## 2014-11-23 MED ORDER — BUDESONIDE 0.25 MG/2ML IN SUSP: 0.2500 mg | Freq: Two times a day (BID) | RESPIRATORY_TRACT | Status: DC

## 2014-11-23 MED ORDER — LIDOCAINE HCL (CARDIAC) 20 MG/ML IV SOLN
INTRAVENOUS | Status: DC | PRN
  Administered 2014-11-23: 50 mg via INTRAVENOUS

## 2014-11-23 MED ORDER — GLYCOPYRROLATE 0.2 MG/ML IJ SOLN
INTRAMUSCULAR | Status: DC | PRN
  Administered 2014-11-23 (×2): 0.2 mg via INTRAVENOUS
  Administered 2014-11-23: 0.4 mg via INTRAVENOUS

## 2014-11-23 MED ORDER — FAMOTIDINE 20 MG PO TABS
20.0000 mg | ORAL_TABLET | Freq: Two times a day (BID) | ORAL | Status: DC
  Administered 2014-11-23 – 2014-11-26 (×6): 20 mg via ORAL
  Filled 2014-11-23 (×6): qty 1

## 2014-11-23 MED ORDER — MENTHOL 3 MG MT LOZG: 1.0000 | LOZENGE | OROMUCOSAL | Status: DC | PRN

## 2014-11-23 MED ORDER — FLUTICASONE PROPIONATE 50 MCG/ACT NA SUSP
2.0000 | Freq: Every day | NASAL | Status: DC
  Administered 2014-11-23 – 2014-11-26 (×4): 2 via NASAL
  Filled 2014-11-23: qty 16

## 2014-11-23 MED ORDER — ONDANSETRON HCL 4 MG/2ML IJ SOLN
INTRAMUSCULAR | Status: AC
  Filled 2014-11-23: qty 2

## 2014-11-23 MED ORDER — MEPERIDINE HCL 25 MG/ML IJ SOLN: 6.2500 mg | INTRAMUSCULAR | Status: DC | PRN

## 2014-11-23 MED ORDER — ROCURONIUM BROMIDE 100 MG/10ML IV SOLN
INTRAVENOUS | Status: DC | PRN
  Administered 2014-11-23: 50 mg via INTRAVENOUS

## 2014-11-23 MED ORDER — PROPOFOL 10 MG/ML IV BOLUS
INTRAVENOUS | Status: DC | PRN
  Administered 2014-11-23: 200 mg via INTRAVENOUS

## 2014-11-23 MED ORDER — ONDANSETRON HCL 4 MG/2ML IJ SOLN
INTRAMUSCULAR | Status: DC | PRN
  Administered 2014-11-23: 4 mg via INTRAVENOUS

## 2014-11-23 MED ORDER — OXYCODONE HCL ER 10 MG PO T12A
20.0000 mg | EXTENDED_RELEASE_TABLET | Freq: Two times a day (BID) | ORAL | Status: DC
  Administered 2014-11-23 – 2014-11-26 (×6): 20 mg via ORAL
  Filled 2014-11-23 (×6): qty 2

## 2014-11-23 MED ORDER — DIPHENHYDRAMINE HCL 12.5 MG/5ML PO ELIX: 12.5000 mg | ORAL_SOLUTION | ORAL | Status: DC | PRN

## 2014-11-23 MED ORDER — MIDAZOLAM HCL 2 MG/2ML IJ SOLN
INTRAMUSCULAR | Status: AC
  Filled 2014-11-23: qty 4

## 2014-11-23 MED ORDER — ZOLPIDEM TARTRATE 5 MG PO TABS: 5.0000 mg | ORAL_TABLET | Freq: Every evening | ORAL | Status: DC | PRN

## 2014-11-23 MED ORDER — PROMETHAZINE HCL 25 MG/ML IJ SOLN: 6.2500 mg | INTRAMUSCULAR | Status: DC | PRN

## 2014-11-23 MED ORDER — ROCURONIUM BROMIDE 50 MG/5ML IV SOLN
INTRAVENOUS | Status: AC
  Filled 2014-11-23: qty 1

## 2014-11-23 MED ORDER — ALUM & MAG HYDROXIDE-SIMETH 200-200-20 MG/5ML PO SUSP: 30.0000 mL | ORAL | Status: DC | PRN

## 2014-11-23 MED ORDER — FESOTERODINE FUMARATE ER 8 MG PO TB24
8.0000 mg | ORAL_TABLET | Freq: Every day | ORAL | Status: DC
  Administered 2014-11-23 – 2014-11-26 (×4): 8 mg via ORAL
  Filled 2014-11-23 (×4): qty 1

## 2014-11-23 MED ORDER — 0.9 % SODIUM CHLORIDE (POUR BTL) OPTIME
TOPICAL | Status: DC | PRN
  Administered 2014-11-23: 1000 mL

## 2014-11-23 MED ORDER — SUCCINYLCHOLINE CHLORIDE 20 MG/ML IJ SOLN
INTRAMUSCULAR | Status: AC
  Filled 2014-11-23: qty 1

## 2014-11-23 MED ORDER — CEFAZOLIN SODIUM 1-5 GM-% IV SOLN
1.0000 g | Freq: Four times a day (QID) | INTRAVENOUS | Status: AC
  Administered 2014-11-23 – 2014-11-24 (×2): 1 g via INTRAVENOUS
  Filled 2014-11-23 (×2): qty 50

## 2014-11-23 MED ORDER — ACETAMINOPHEN 650 MG RE SUPP: 650.0000 mg | Freq: Four times a day (QID) | RECTAL | Status: DC | PRN

## 2014-11-23 MED ORDER — DEXMEDETOMIDINE HCL IN NACL 200 MCG/50ML IV SOLN
INTRAVENOUS | Status: DC | PRN
  Administered 2014-11-23 (×4): 10 ug via INTRAVENOUS

## 2014-11-23 MED ORDER — METOPROLOL TARTRATE 50 MG PO TABS
50.0000 mg | ORAL_TABLET | Freq: Every day | ORAL | Status: DC
  Administered 2014-11-23 – 2014-11-26 (×4): 50 mg via ORAL
  Filled 2014-11-23 (×4): qty 1

## 2014-11-23 MED ORDER — NEOSTIGMINE METHYLSULFATE 10 MG/10ML IV SOLN
INTRAVENOUS | Status: AC
  Filled 2014-11-23: qty 1

## 2014-11-23 MED ORDER — GLYCOPYRROLATE 0.2 MG/ML IJ SOLN
INTRAMUSCULAR | Status: AC
  Filled 2014-11-23: qty 2

## 2014-11-23 MED ORDER — AMLODIPINE BESYLATE 10 MG PO TABS
10.0000 mg | ORAL_TABLET | Freq: Every day | ORAL | Status: DC
  Administered 2014-11-23 – 2014-11-26 (×4): 10 mg via ORAL
  Filled 2014-11-23 (×4): qty 1

## 2014-11-23 MED ORDER — METOCLOPRAMIDE HCL 5 MG PO TABS: 5.0000 mg | ORAL_TABLET | Freq: Three times a day (TID) | ORAL | Status: DC | PRN

## 2014-11-23 MED ORDER — DOCUSATE SODIUM 100 MG PO CAPS
100.0000 mg | ORAL_CAPSULE | Freq: Two times a day (BID) | ORAL | Status: DC
  Administered 2014-11-23 – 2014-11-26 (×6): 100 mg via ORAL
  Filled 2014-11-23 (×6): qty 1

## 2014-11-23 MED ORDER — FUROSEMIDE 40 MG PO TABS
40.0000 mg | ORAL_TABLET | Freq: Every day | ORAL | Status: DC
  Administered 2014-11-23 – 2014-11-26 (×4): 40 mg via ORAL
  Filled 2014-11-23 (×4): qty 1

## 2014-11-23 MED ORDER — EPHEDRINE SULFATE 50 MG/ML IJ SOLN
INTRAMUSCULAR | Status: DC | PRN
  Administered 2014-11-23: 10 mg via INTRAVENOUS
  Administered 2014-11-23: 15 mg via INTRAVENOUS
  Administered 2014-11-23: 10 mg via INTRAVENOUS

## 2014-11-23 MED ORDER — ATORVASTATIN CALCIUM 40 MG PO TABS
40.0000 mg | ORAL_TABLET | Freq: Every day | ORAL | Status: DC
  Administered 2014-11-23 – 2014-11-25 (×3): 40 mg via ORAL
  Filled 2014-11-23 (×3): qty 1

## 2014-11-23 MED ORDER — HYDROMORPHONE HCL 1 MG/ML IJ SOLN: 1.0000 mg | INTRAMUSCULAR | Status: DC | PRN

## 2014-11-23 SURGICAL SUPPLY — 52 items
BENZOIN TINCTURE PRP APPL 2/3 (GAUZE/BANDAGES/DRESSINGS) ×3 IMPLANT
BLADE SAW SGTL 18X1.27X75 (BLADE) ×2 IMPLANT
BLADE SAW SGTL 18X1.27X75MM (BLADE) ×1
BLADE SURG ROTATE 9660 (MISCELLANEOUS) IMPLANT
CAPT HIP TOTAL 2 ×3 IMPLANT
CELLS DAT CNTRL 66122 CELL SVR (MISCELLANEOUS) ×1 IMPLANT
CLOSURE WOUND 1/2 X4 (GAUZE/BANDAGES/DRESSINGS) ×2
COVER SURGICAL LIGHT HANDLE (MISCELLANEOUS) ×3 IMPLANT
DRAPE C-ARM 42X72 X-RAY (DRAPES) ×3 IMPLANT
DRAPE STERI IOBAN 125X83 (DRAPES) ×3 IMPLANT
DRAPE U-SHAPE 47X51 STRL (DRAPES) ×9 IMPLANT
DRSG AQUACEL AG ADV 3.5X10 (GAUZE/BANDAGES/DRESSINGS) ×3 IMPLANT
DURAPREP 26ML APPLICATOR (WOUND CARE) ×3 IMPLANT
ELECT BLADE 4.0 EZ CLEAN MEGAD (MISCELLANEOUS) ×3
ELECT BLADE 6.5 EXT (BLADE) IMPLANT
ELECT REM PT RETURN 9FT ADLT (ELECTROSURGICAL) ×3
ELECTRODE BLDE 4.0 EZ CLN MEGD (MISCELLANEOUS) ×1 IMPLANT
ELECTRODE REM PT RTRN 9FT ADLT (ELECTROSURGICAL) ×1 IMPLANT
FACESHIELD WRAPAROUND (MASK) ×6 IMPLANT
GAUZE XEROFORM 1X8 LF (GAUZE/BANDAGES/DRESSINGS) ×3 IMPLANT
GLOVE BIOGEL PI IND STRL 8 (GLOVE) ×2 IMPLANT
GLOVE BIOGEL PI INDICATOR 8 (GLOVE) ×4
GLOVE ECLIPSE 8.0 STRL XLNG CF (GLOVE) ×3 IMPLANT
GLOVE ORTHO TXT STRL SZ7.5 (GLOVE) ×6 IMPLANT
GOWN STRL REUS W/ TWL LRG LVL3 (GOWN DISPOSABLE) ×2 IMPLANT
GOWN STRL REUS W/ TWL XL LVL3 (GOWN DISPOSABLE) ×2 IMPLANT
GOWN STRL REUS W/TWL LRG LVL3 (GOWN DISPOSABLE) ×4
GOWN STRL REUS W/TWL XL LVL3 (GOWN DISPOSABLE) ×4
HANDPIECE INTERPULSE COAX TIP (DISPOSABLE) ×2
KIT BASIN OR (CUSTOM PROCEDURE TRAY) ×3 IMPLANT
KIT ROOM TURNOVER OR (KITS) ×3 IMPLANT
MANIFOLD NEPTUNE II (INSTRUMENTS) ×3 IMPLANT
NS IRRIG 1000ML POUR BTL (IV SOLUTION) ×3 IMPLANT
PACK TOTAL JOINT (CUSTOM PROCEDURE TRAY) ×3 IMPLANT
PACK UNIVERSAL I (CUSTOM PROCEDURE TRAY) ×3 IMPLANT
PAD ARMBOARD 7.5X6 YLW CONV (MISCELLANEOUS) ×3 IMPLANT
RTRCTR WOUND ALEXIS 18CM MED (MISCELLANEOUS) ×3
SET HNDPC FAN SPRY TIP SCT (DISPOSABLE) ×1 IMPLANT
STAPLER VISISTAT 35W (STAPLE) ×6 IMPLANT
STRIP CLOSURE SKIN 1/2X4 (GAUZE/BANDAGES/DRESSINGS) ×4 IMPLANT
SUT ETHIBOND NAB CT1 #1 30IN (SUTURE) ×6 IMPLANT
SUT MNCRL AB 4-0 PS2 18 (SUTURE) IMPLANT
SUT VIC AB 0 CT1 27 (SUTURE) ×2
SUT VIC AB 0 CT1 27XBRD ANBCTR (SUTURE) ×1 IMPLANT
SUT VIC AB 1 CT1 27 (SUTURE) ×2
SUT VIC AB 1 CT1 27XBRD ANBCTR (SUTURE) ×1 IMPLANT
SUT VIC AB 2-0 CT1 27 (SUTURE) ×4
SUT VIC AB 2-0 CT1 TAPERPNT 27 (SUTURE) ×2 IMPLANT
TOWEL OR 17X24 6PK STRL BLUE (TOWEL DISPOSABLE) ×3 IMPLANT
TOWEL OR 17X26 10 PK STRL BLUE (TOWEL DISPOSABLE) ×3 IMPLANT
TRAY FOLEY CATH 16FRSI W/METER (SET/KITS/TRAYS/PACK) IMPLANT
WATER STERILE IRR 1000ML POUR (IV SOLUTION) IMPLANT

## 2014-11-23 NOTE — Brief Op Note (Signed)
11/23/2014  4:40 PM  PATIENT:  Vanessa Palmer  64 y.o. female  PRE-OPERATIVE DIAGNOSIS:  Severe osteoarthritis right hip  POST-OPERATIVE DIAGNOSIS:  Severe osteoarthritis right hip  PROCEDURE:  Procedure(s): RIGHT TOTAL HIP ARTHROPLASTY ANTERIOR APPROACH (Right)  SURGEON:  Surgeon(s) and Role:    * Kathryne Hitch, MD - Primary  PHYSICIAN ASSISTANT: Rexene Edison, PA-C  ANESTHESIA:   general  EBL:  Total I/O In: 1000 [I.V.:1000] Out: 250 [Blood:250]  BLOOD ADMINISTERED:none  DRAINS: none   LOCAL MEDICATIONS USED:  NONE  SPECIMEN:  No Specimen  DISPOSITION OF SPECIMEN:  N/A  COUNTS:  YES  TOURNIQUET:  * No tourniquets in log *  DICTATION: .Other Dictation: Dictation Number [id number]  PLAN OF CARE: Admit to inpatient   PATIENT DISPOSITION:  PACU - hemodynamically stable.   Delay start of Pharmacological VTE agent (>24hrs) due to surgical blood loss or risk of bleeding: no

## 2014-11-23 NOTE — H&P (Signed)
TOTAL HIP ADMISSION H&P  Patient is admitted for right total hip arthroplasty.  Subjective:  Chief Complaint: right hip pain  HPI: Vanessa Palmer, 64 y.o. female, has a history of pain and functional disability in the right hip(s) due to arthritis and patient has failed non-surgical conservative treatments for greater than 12 weeks to include NSAID's and/or analgesics, corticosteriod injections, flexibility and strengthening excercises, supervised PT with diminished ADL's post treatment, use of assistive devices, weight reduction as appropriate and activity modification.  Onset of symptoms was abrupt starting 1 years ago with rapidlly worsening course since that time.The patient noted no past surgery on the right hip(s).  Patient currently rates pain in the right hip at 10 out of 10 with activity. Patient has night pain, worsening of pain with activity and weight bearing, trendelenberg gait, pain that interfers with activities of daily living and pain with passive range of motion. Patient has evidence of subchondral cysts, subchondral sclerosis, periarticular osteophytes and joint space narrowing by imaging studies. This condition presents safety issues increasing the risk of falls.  There is no current active infection.  Patient Active Problem List   Diagnosis Date Noted  . Osteoarthritis of right hip 10/13/2014  . Normocytic anemia 08/11/2014  . Leg cramping 01/06/2014  . Other screening mammogram 09/09/2013  . Shingles 11/19/2012  . Miliaria 07/16/2012  . DVT of lower extremity, bilateral 04/04/2012  . Routine health maintenance 01/14/2012  . GERD (gastroesophageal reflux disease) 01/09/2012  . Urinary incontinence 12/31/2011  . Chronic kidney disease (CKD), stage III (moderate) 09/19/2010  . Pes planus, congenital 08/08/2010  . Chronic pelvic pain in female 02/03/2010  . Asthma, chronic 09/27/2009  . Elevated alkaline phosphatase level 08/01/2009  . Pain in joint, multiple sites  12/21/2008  . MAJOR DPRSV DISORDER RECURRENT EPISODE MODERATE 09/08/2008  . Gout 07/05/2008  . Hyperlipidemia 05/07/2006  . Coronary atherosclerosis 02/05/2006  . Morbid obesity 12/13/2005  . History of alcohol abuse 12/13/2005  . SLEEP APNEA, OBSTRUCTIVE 12/13/2005  . Essential hypertension 12/13/2005  . Pre-diabetes 12/13/2005   Past Medical History  Diagnosis Date  . Hyperlipidemia   . Hypertension   . Gout   . Obesity   . Depression   . DJD (degenerative joint disease)   . OSA (obstructive sleep apnea)     previously used CPAP, has lost  . CAD (coronary artery disease)     s/p non-Q wave MI in 06/2001 and 2004, 2005  . ALCOHOL ABUSE 12/13/2005    Annotation: Sober since 11/06 Qualifier: Diagnosis of  By: Wallace Cullens MD, Natalia Leatherwood    . INTRINSIC ASTHMA, WITH EXACERBATION 09/27/2009    Qualifier: Diagnosis of  By: Denton Meek MD, Tillie Rung    . Chronic kidney disease (CKD), stage III (moderate) 09/19/2010  . CHF (congestive heart failure)   . DVT of lower extremity, bilateral 04/04/2012    On Xarelto    . GERD (gastroesophageal reflux disease)   . Seizures     As a teenager   . Headache     migraines  . Anemia     Past Surgical History  Procedure Laterality Date  . Partial hysterectomy      due to endometriosis  . Tubal ligation    . Cardiac catheterization      06/2001, 02/2002, 10/2004 (10-15% proximal stenosis of circumflex, diffuse disease of  OM1 and RCA)  . Knee arthroscopy Left   . Colonoscopy      No prescriptions prior to admission   Allergies  Allergen  Reactions  . Ace Inhibitors Other (See Comments)     Angioedema  . Lexiscan [Regadenoson] Hives  . Peanuts [Peanut Oil] Itching    whelps  . Hydromorphone Hcl Other (See Comments)    diffuse rash  . Percocet [Oxycodone-Acetaminophen] Itching    Ok with medicine for itching  . Propoxyphene N-Acetaminophen Itching  . Vicodin [Hydrocodone-Acetaminophen] Itching    Social History  Substance Use Topics  . Smoking  status: Never Smoker   . Smokeless tobacco: Never Used  . Alcohol Use: No     Comment: last alcohol use 02/27/14    Family History  Problem Relation Age of Onset  . Mental illness Mother   . Heart disease Father   . Diabetes Sister   . Diabetes Sister   . Diabetes Sister      Review of Systems  Musculoskeletal: Positive for joint pain.  All other systems reviewed and are negative.   Objective:  Physical Exam  Constitutional: She is oriented to person, place, and time. She appears well-developed and well-nourished.  HENT:  Head: Normocephalic and atraumatic.  Eyes: EOM are normal. Pupils are equal, round, and reactive to light.  Neck: Normal range of motion. Neck supple.  Cardiovascular: Normal rate and regular rhythm.   Respiratory: Effort normal and breath sounds normal.  GI: Soft. Bowel sounds are normal.  Musculoskeletal:       Right hip: She exhibits decreased range of motion, decreased strength, tenderness, bony tenderness and crepitus.  Neurological: She is alert and oriented to person, place, and time.  Skin: Skin is warm and dry.  Psychiatric: She has a normal mood and affect.    Vital signs in last 24 hours:    Labs:   Estimated body mass index is 40.15 kg/(m^2) as calculated from the following:   Height as of 11/01/14:  (1.626 m).   Weight as of 11/01/14: 106.142 kg (234 lb).   Imaging Review Plain radiographs demonstrate severe degenerative joint disease of the right hip(s). The bone quality appears to be good for age and reported activity level.  Assessment/Plan:  End stage arthritis, right hip(s)  The patient history, physical examination, clinical judgement of the provider and imaging studies are consistent with end stage degenerative joint disease of the right hip(s) and total hip arthroplasty is deemed medically necessary. The treatment options including medical management, injection therapy, arthroscopy and arthroplasty were discussed at length.  The risks and benefits of total hip arthroplasty were presented and reviewed. The risks due to aseptic loosening, infection, stiffness, dislocation/subluxation,  thromboembolic complications and other imponderables were discussed.  The patient acknowledged the explanation, agreed to proceed with the plan and consent was signed. Patient is being admitted for inpatient treatment for surgery, pain control, PT, OT, prophylactic antibiotics, VTE prophylaxis, progressive ambulation and ADL's and discharge planning.The patient is planning to be discharged home with home health services

## 2014-11-23 NOTE — Transfer of Care (Signed)
Immediate Anesthesia Transfer of Care Note  Patient: Vanessa Palmer  Procedure(s) Performed: Procedure(s): RIGHT TOTAL HIP ARTHROPLASTY ANTERIOR APPROACH (Right)  Patient Location: PACU  Anesthesia Type:General  Level of Consciousness: awake  Airway & Oxygen Therapy: Patient Spontanous Breathing and Patient connected to nasal cannula oxygen  Post-op Assessment: Report given to RN and Patient moving all extremities X 4  Post vital signs: Reviewed and stable  Last Vitals:  Filed Vitals:   11/23/14 1249  BP: 134/68  Pulse: 84  Temp: 36.2 C  Resp: 20    Complications: No apparent anesthesia complications

## 2014-11-23 NOTE — Anesthesia Procedure Notes (Signed)
Procedure Name: Intubation Date/Time: 11/23/2014 3:15 PM Performed by: Jefm Miles E Pre-anesthesia Checklist: Patient identified, Emergency Drugs available, Suction available, Patient being monitored and Timeout performed Patient Re-evaluated:Patient Re-evaluated prior to inductionOxygen Delivery Method: Circle system utilized Preoxygenation: Pre-oxygenation with 100% oxygen Intubation Type: IV induction Ventilation: Mask ventilation without difficulty Laryngoscope Size: Mac and 3 Grade View: Grade I Tube type: Oral Tube size: 7.0 mm Number of attempts: 1 Airway Equipment and Method: Stylet Placement Confirmation: ETT inserted through vocal cords under direct vision,  positive ETCO2 and breath sounds checked- equal and bilateral Secured at: 21 cm Tube secured with: Tape Dental Injury: Teeth and Oropharynx as per pre-operative assessment

## 2014-11-23 NOTE — Anesthesia Postprocedure Evaluation (Signed)
  Anesthesia Post-op Note  Patient: Vanessa Palmer  Procedure(s) Performed: Procedure(s) (LRB): RIGHT TOTAL HIP ARTHROPLASTY ANTERIOR APPROACH (Right)  Patient Location: PACU  Anesthesia Type: General  Level of Consciousness: awake and alert   Airway and Oxygen Therapy: Patient Spontanous Breathing  Post-op Pain: mild  Post-op Assessment: Post-op Vital signs reviewed, Patient's Cardiovascular Status Stable, Respiratory Function Stable, Patent Airway and No signs of Nausea or vomiting  Last Vitals:  Filed Vitals:   11/23/14 1830  BP: 149/79  Pulse:   Temp: 36.3 C  Resp:     Post-op Vital Signs: stable   Complications: No apparent anesthesia complications

## 2014-11-23 NOTE — Anesthesia Preprocedure Evaluation (Addendum)
Anesthesia Evaluation  Patient identified by MRN, date of birth, ID band Patient awake    Reviewed: Allergy & Precautions, NPO status , Patient's Chart, lab work & pertinent test results, reviewed documented beta blocker date and time   Airway Mallampati: II  TM Distance: >3 FB Neck ROM: Full    Dental no notable dental hx.    Pulmonary asthma , sleep apnea ,    Pulmonary exam normal breath sounds clear to auscultation       Cardiovascular hypertension, Pt. on home beta blockers and Pt. on medications + CAD, + Peripheral Vascular Disease and +CHF  Normal cardiovascular exam Rhythm:Regular Rate:Normal     Neuro/Psych  Headaches, Seizures -,  negative psych ROS   GI/Hepatic Neg liver ROS, GERD  ,  Endo/Other  negative endocrine ROS  Renal/GU CRFRenal disease     Musculoskeletal  (+) Arthritis ,   Abdominal (+) + obese,   Peds  Hematology  (+) anemia ,   Anesthesia Other Findings   Reproductive/Obstetrics negative OB ROS                           Anesthesia Physical Anesthesia Plan  ASA: III  Anesthesia Plan: General   Post-op Pain Management:    Induction: Intravenous  Airway Management Planned: Oral ETT  Additional Equipment:   Intra-op Plan:   Post-operative Plan: Extubation in OR  Informed Consent: I have reviewed the patients History and Physical, chart, labs and discussed the procedure including the risks, benefits and alternatives for the proposed anesthesia with the patient or authorized representative who has indicated his/her understanding and acceptance.   Dental advisory given  Plan Discussed with: CRNA  Anesthesia Plan Comments:        Anesthesia Quick Evaluation

## 2014-11-24 ENCOUNTER — Encounter (HOSPITAL_COMMUNITY): Payer: Self-pay | Admitting: Orthopaedic Surgery

## 2014-11-24 LAB — CBC
HEMATOCRIT: 34.1 % — AB (ref 36.0–46.0)
Hemoglobin: 11.5 g/dL — ABNORMAL LOW (ref 12.0–15.0)
MCH: 30.8 pg (ref 26.0–34.0)
MCHC: 33.7 g/dL (ref 30.0–36.0)
MCV: 91.4 fL (ref 78.0–100.0)
PLATELETS: 202 10*3/uL (ref 150–400)
RBC: 3.73 MIL/uL — ABNORMAL LOW (ref 3.87–5.11)
RDW: 14.3 % (ref 11.5–15.5)
WBC: 9.2 10*3/uL (ref 4.0–10.5)

## 2014-11-24 LAB — BASIC METABOLIC PANEL
Anion gap: 9 (ref 5–15)
BUN: 16 mg/dL (ref 6–20)
CO2: 29 mmol/L (ref 22–32)
CREATININE: 1.27 mg/dL — AB (ref 0.44–1.00)
Calcium: 8.4 mg/dL — ABNORMAL LOW (ref 8.9–10.3)
Chloride: 98 mmol/L — ABNORMAL LOW (ref 101–111)
GFR, EST AFRICAN AMERICAN: 52 mL/min — AB (ref >60)
GFR, EST NON AFRICAN AMERICAN: 45 mL/min — AB (ref >60)
Glucose, Bld: 123 mg/dL — ABNORMAL HIGH (ref 65–99)
POTASSIUM: 4.4 mmol/L (ref 3.5–5.1)
SODIUM: 136 mmol/L (ref 135–145)

## 2014-11-24 MED ORDER — RIVAROXABAN 20 MG PO TABS
20.0000 mg | ORAL_TABLET | Freq: Every day | ORAL | Status: DC
  Administered 2014-11-24 – 2014-11-26 (×3): 20 mg via ORAL
  Filled 2014-11-24 (×3): qty 1

## 2014-11-24 NOTE — Clinical Social Work Note (Signed)
Clinical Social Work Assessment  Patient Details  Name: Vanessa Palmer MRN: [id number] Date of Birth: 1950-07-20  Date of referral:  11/24/14               Reason for consult:  Facility Placement, Discharge Planning                Permission sought to share information with:  Facility Sport and exercise psychologist, Family Supports Permission granted to share information::  Yes, Verbal Permission Granted  Name::     Vanessa Palmer  Agency::  [facility]  Relationship::  Sister  Contact Information:     Housing/Transportation Living arrangements for the past 2 months:  Single Family Home Source of Information:  Patient Patient Interpreter Needed:  None Criminal Activity/Legal Involvement Pertinent to Current Situation/Hospitalization:  No - Comment as needed Significant Relationships:  Siblings, Adult Children Lives with:  Self Do you feel safe going back to the place where you live?  No (High fall risk.) Need for family participation in patient care:  Yes (Comment) (Patient's sister active in patient's care.)  Care giving concerns:  Patient expressed no concerns at this time.   Social Worker assessment / plan:  CSW received referral for possible SNF placement at time of discharge. CSW met with patient, patient's sister, and patient's sister at bedside. Patient expressed understanding of PT recommendation for SNF placement at time of discharge. Patient nor patient's family expressed preference in SNF placement at this time. CSW to continue to follow and assist with discharge planning needs.  Employment status:  Other (Comment) (Patient did not disclose employment.) Insurance information:  Energy manager) PT Recommendations:  [place] / Referral to community resources:  [place]  Patient/Family's Response to care:  Patient and patient's family understanding and agreeable to CSW plan of  care.  Patient/Family's Understanding of and Emotional Response to Diagnosis, Current Treatment, and Prognosis:  Patient and patient's family understanding and agreeable to CSW plan of care.  Emotional Assessment Appearance:  Appears younger than stated age Attitude/Demeanor/Rapport:  Other (Pleasant.) Affect (typically observed):  Accepting, Appropriate, Quiet, Pleasant Orientation:  Oriented to Self, Oriented to Place, Oriented to  Time, Oriented to Situation Alcohol / Substance use:  Not Applicable Psych involvement (Current and /or in the community):  No (Comment) (Not appropriate on this admission.)  Discharge Needs  Concerns to be addressed:  No discharge needs identified Readmission within the last 30 days:  No Current discharge risk:  None Barriers to Discharge:  No Barriers Identified   Caroline Sauger, LCSW 11/24/2014, 12:12 PM [phone]

## 2014-11-24 NOTE — Progress Notes (Signed)
Subjective: 1 Day Post-Op Procedure(s) (LRB): RIGHT TOTAL HIP ARTHROPLASTY ANTERIOR APPROACH (Right) Patient reports pain as moderate.    Objective: Vital signs in last 24 hours: Temp:  [97.2 F (36.2 C)-98.4 F (36.9 C)] 98.2 F (36.8 C) (09/28 0439) Pulse Rate:  [57-84] 71 (09/28 0439) Resp:  [10-20] 18 (09/28 0439) BP: (134-171)/(56-111) 152/88 mmHg (09/28 0439) SpO2:  [97 %-100 %] 100 % (09/28 0439) Weight:  [103.42 kg (228 lb)] 103.42 kg (228 lb) (09/27 1249)  Intake/Output from previous day: 09/27 0701 - 09/28 0700 In: 1400 [I.V.:1400] Out: 250 [Blood:250] Intake/Output this shift:     Recent Labs  11/24/14 0540  HGB 11.5*    Recent Labs  11/24/14 0540  WBC 9.2  RBC 3.73*  HCT 34.1*  PLT 202   No results for input(s): NA, K, CL, CO2, BUN, CREATININE, GLUCOSE, CALCIUM in the last 72 hours.  Recent Labs  11/23/14 1320  INR 1.10    Sensation intact distally Intact pulses distally Dorsiflexion/Plantar flexion intact Incision: scant drainage  Assessment/Plan: 1 Day Post-Op Procedure(s) (LRB): RIGHT TOTAL HIP ARTHROPLASTY ANTERIOR APPROACH (Right) Up with therapy  BLACKMAN,CHRISTOPHER Y 11/24/2014, 7:15 AM

## 2014-11-24 NOTE — Evaluation (Signed)
Physical Therapy Evaluation Patient Details Name: Vanessa Palmer MRN: [id number] DOB: April 03, 1950 Today's Date: 11/24/2014   History of Present Illness  Patient is a 64 y/o female s/p R THA. PMH includes HTN, HLD, gout, depression, CAD, CHF, CKD, DVT, seizures.   Clinical Impression  Patient presents with pain, lethargy and post surgical deficits RLE s/p Rt THA. Pt very lethargic and had an episode of "feeling faint" due to drop in BP post transfer to chair. RN notified. Requires Min A for transfers and mobility at this time due to lethargy, pain and weakness. Increased time and effort to perform all mobility. Pt lives alone and has assist from an aide 7 days/week for 8 hours. Would benefit from ST SNF to maximize independence and mobilty prior to return home. Will follow acutely.    Follow Up Recommendations SNF    Equipment Recommendations  None recommended by PT    Recommendations for Other Services OT consult     Precautions / Restrictions Precautions Precautions: Fall Precaution Comments: direct anterior approach Restrictions Weight Bearing Restrictions: Yes RLE Weight Bearing: Weight bearing as tolerated      Mobility  Bed Mobility Overal bed mobility: Needs Assistance Bed Mobility: Supine to Sit     Supine to sit: Min assist;HOB elevated     General bed mobility comments: Min A to bring RLE to EOB. Cues for technique. increased time and effort due to lethargy and pain.  Transfers Overall transfer level: Needs assistance Equipment used: Rolling walker (2 wheeled) Transfers: Sit to/from [employer] Sit to Stand: Min assist         General transfer comment: Min A to boost from EOB with cues for hand placement/technique. Despite cues pt leaning with forearms on walker handles and not staying upright. Flexed posture at hips/spine. SPT bed to chair with Min A leaning on walker, very slow.  Ambulation/Gait                Stairs            Wheelchair Mobility    Modified Rankin (Stroke Patients Only)       Balance Overall balance assessment: Needs assistance Sitting-balance support: Feet supported;Single extremity supported Sitting balance-Leahy Scale: Fair     Standing balance support: During functional activity Standing balance-Leahy Scale: Poor Standing balance comment: Relient on RW for support.                              Pertinent Vitals/Pain Pain Assessment: Faces Faces Pain Scale: Hurts whole lot Pain Location: right hip Pain Descriptors / Indicators: Sore;Burning Pain Intervention(s): Repositioned;Premedicated before session;Limited activity within patient's tolerance;Monitored during session;Ice applied;Utilized relaxation techniques    Home Living Family/patient expects to be discharged to:: Private residence Living Arrangements: Alone Available Help at Discharge: Friend(s);Family;Available PRN/intermittently;Personal care attendant ("they live across town.") Type of Home: Apartment Home Access: Elevator     Home Layout: One level Home Equipment: Walker - 2 wheels;Cane - single point;Wheelchair - power      Prior Function Level of Independence: Needs assistance   Gait / Transfers Assistance Needed: pt reports ambulation with RW vs SPC PTA. Uses motorzed w/c for long distances in the community.  ADL's / Homemaking Assistance Needed: Has an aide come 7 days/week for 8 hours to assist with ADLs.        Hand Dominance        Extremity/Trunk Assessment   Upper Extremity Assessment: Defer  to OT evaluation           Lower Extremity Assessment: RLE deficits/detail RLE Deficits / Details: Limited AROM/strength secondary to pain and recent surgery. Sensitive to touch at times. Difficult to fully assess.       Communication   Communication: No difficulties  Cognition Arousal/Alertness: Suspect due to medications;Lethargic Behavior During Therapy: Flat affect Overall  Cognitive Status: Difficult to assess                      General Comments General comments (skin integrity, edema, etc.): Sp02 decreased to 85% on RA. Donned 2L/min 02. Pt very lethargic. Reports feeling faint and diaphoresis post transfer to chair. BP 104/80. RN came into room.    Exercises        Assessment/Plan    PT Assessment Patient needs continued PT services  PT Diagnosis Acute pain;Generalized weakness;Difficulty walking   PT Problem List Decreased strength;Pain;Cardiopulmonary status limiting activity;Decreased range of motion;Impaired sensation;Decreased activity tolerance;Decreased balance;Decreased mobility;Decreased knowledge of precautions  PT Treatment Interventions Balance training;Gait training;Therapeutic activities;Therapeutic exercise;Functional mobility training;Patient/family education   PT Goals (Current goals can be found in the Care Plan section) Acute Rehab PT Goals Patient Stated Goal: none stated PT Goal Formulation: Patient unable to participate in goal setting Time For Goal Achievement: 12/08/14 Potential to Achieve Goals: Fair    Frequency 7X/week   Barriers to discharge Decreased caregiver support Pt lives alone.     Co-evaluation               End of Session Equipment Utilized During Treatment: Gait belt;Oxygen Activity Tolerance: Treatment limited secondary to medical complications (Comment);Patient limited by pain;Patient limited by lethargy (2/2 to hypotension, feeling faint and pain.) Patient left: in chair;with call bell/phone within reach;with family/visitor present Nurse Communication: Mobility status         Time: 1005-1045 PT Time Calculation (min) (ACUTE ONLY): 40 min   Charges:   PT Evaluation $Initial PT Evaluation Tier I: 1 Procedure PT Treatments $Therapeutic Activity: 23-37 mins   PT G Codes:        Shauna A Hartshorne 11/24/2014, 11:37 AM Mylo Red, PT, DPT [phone]

## 2014-11-24 NOTE — Progress Notes (Signed)
Utilization review completed.  

## 2014-11-24 NOTE — Clinical Social Work Placement (Signed)
   CLINICAL SOCIAL WORK PLACEMENT  NOTE  Date:  11/24/2014  Patient Details  Name: Vanessa Palmer MRN: [id number] Date of Birth: January 20, 1951  Clinical Social Work is seeking post-discharge placement for this patient at the Skilled  Nursing Facility level of care (*CSW will initial, date and re-position this form in  chart as items are completed):  Yes   Patient/family provided with [facility] Clinical Social Work Department's list of facilities offering this level of care within the geographic area requested by the patient (or if unable, by the patient's family).  Yes   Patient/family informed of their freedom to choose among providers that offer the needed level of care, that participate in Medicare, Medicaid or managed care program needed by the patient, have an available bed and are willing to accept the patient.  Yes   Patient/family informed of [facility]'s ownership interest in [facility] and [facility], as well as of the fact that they are under no obligation to receive care at these facilities.  PASRR submitted to EDS on 11/24/14     PASRR number received on 11/24/14     Existing PASRR number confirmed on  (n/a)     FL2 transmitted to all facilities in geographic area requested by pt/family on 11/24/14     FL2 transmitted to all facilities within larger geographic area on  (n/a)     Patient informed that his/her managed care company has contracts with or will negotiate with certain facilities, including the following:   (yes, [facility])         Patient/family informed of bed offers received.  Patient chooses bed at       Physician recommends and patient chooses bed at      Patient to be transferred to   on  .  Patient to be transferred to facility by       Patient family notified on   of transfer.  Name of family member notified:        PHYSICIAN Please sign FL2     Additional Comment:     _______________________________________________ Rod Mae, LCSW 11/24/2014, 3:07 PM

## 2014-11-24 NOTE — Progress Notes (Signed)
OT Cancellation Note  Patient Details Name: Vanessa Palmer MRN: [id number] DOB: December 07, 1950   Cancelled Treatment:    Reason Eval/Treat Not Completed: Patient declined, no reason specified (8 out 10 pain requesting OT to come back at another time)  Felecia Shelling   OTR/L Pager: [phone] Office: [phone] .  11/24/2014, 11:21 AM

## 2014-11-24 NOTE — Op Note (Signed)
Vanessa Palmer, Vanessa Palmer NO.:  [id number]  MEDICAL RECORD NO.:  [id number]  LOCATION:  5N20C                        FACILITY:  [facility]  PHYSICIAN:  [name]. [name], M.D.DATE OF BIRTH:  10/09/1950  DATE OF PROCEDURE:  11/23/2014 DATE OF DISCHARGE:                              OPERATIVE REPORT   PREOPERATIVE DIAGNOSES:  Primary osteoarthritis and degenerative joint disease, right hip.  POSTOPERATIVE DIAGNOSES:  Primary osteoarthritis and degenerative joint disease, right hip.  PROCEDURE:  Right total hip arthroplasty through direct anterior approach.  IMPLANTS:  DePuy Sector Gription acetabular component size 48, size 32+ 0 neutral polyethylene liner, size 9 Corail femoral component with varus KLA offset, size 32+ 1 ceramic hip ball.  SURGEON:  [name]. [name], M.D.  ASSISTANT:  Richardean Canal, PA-C.  ANESTHESIA:  General.  BLOOD LOSS:  250 to 300 mL.  ANTIBIOTICS:  2 g of Ancef.  COMPLICATIONS:  None.  INDICATIONS:  Ms. Mcginn is a 64 year old significantly obese individual with debilitating osteoarthritis involving her right hip. She has complete loss of her joint space on x-rays, periarticular osteophytes, cystic changes, and sclerotic changes.  Her pain is daily, it is affecting her mobility, activities of daily living, and her quality of life.  She walks with an assisted device with significant limp.  We have talked about hip replacement surgery to her and explained the risk of acute blood loss anemia, nerve and vessel injury, fracture, infection, dislocation, DVT.  She understands our goals are decreased pain, improved mobility, and overall improved quality of life.  PROCEDURE DESCRIPTION:  After informed consent was obtained, appropriate right hip was marked.  She was brought to the operating room and general anesthesia was obtained while she was on a stretcher.  Traction boots were placed on both of her feet and she was placed  supine on the HANA fracture table with the perineal post in place and both legs in inline skeletal traction devices, but no traction applied.  We then had to taper abdomen out of the field.  We prepped the right hip with DuraPrep and sterile drapes.  Time-out was called and she was identified as correct patient, correct right hip.  I then made an incision just inferior and posterior to the anterior superior iliac spine and carried this slightly obliquely down the leg.  I dissected down the tensor fascia lata muscle and the tensor fascia was then identified and divided longitudinally so we could proceed with a direct anterior approach to the hip.  We identified and cauterized the lateral femoral circumflex vessels and identified the hip capsule, placed Cobra retractors in lateral and medial femoral neck.  I then opened up the hip capsule in an L-type format finding a recent joint effusion.  Placed the Cobra retractors within the joint capsule.  I then made my femoral neck cut with an oscillating saw just proximal to the lesser trochanter and completed this with an osteotome.  I placed a corkscrew guide in the femoral head and removed the femoral head in its entirety and found it to be completely devoid of cartilage.  We then cleaned the acetabulum remnants out the acetabular labrum, I placed a bent Hohmann over  the medial acetabular rim and then began reaming under direct visualization from a size 42 all the way up to just a size 48 with all reamers under direct visualization and last reamer also under direct fluoroscopy, so I could obtain my depth of reaming, my inclination, and anteversion.  Once I was pleased with this, I placed the real DePuy Sector Gription acetabular component size 48 and a 32+ 0 neutral liner for a size 48 acetabular component.  Attention was then turned to the femur.  With the leg externally rotated to 100 degrees, extended, and adducted, we were able to gain  access to the femoral canal after placing a Mueller retractor medially and a Hohmann retractor behind the greater trochanter and releasing the lateral joint capsule.  I used a rongeur to lateralize and a box cutting osteotome to enter the femoral canal and then our broach using just a size 8 and then a size 9 broach.  Based off her offset, we trialed a KLA varus offset femoral neck and a 32+ 1 trial hip ball and reduced in the acetabulum.  We were pleased with stability, leg length, and offset.  We then dislocated the hip, removed the trial components.  We placed the real Corail femoral component with varus offset size 9 followed by the real 32+ 1 ceramic hip ball.  We reduced this in the acetabulum and it was stable.  We then copiously irrigated the soft tissue with normal saline solution using pulsatile lavage.  We closed the joint capsule with interrupted #1 Ethibond suture followed by running #1 Vicryl in the tensor fascia, 0-Vicryl in the deep tissue, 2-0 Vicryl in the subcutaneous tissue, and interrupted staples on the skin. Xeroform and Aquacel dressing was applied.  She was then taken off the HANA table, awakened, extubated, and taken to the recovery room in a stable condition.  All final counts were correct.  There were no complications noted.  Of note, Richardean Canal, PA-C assisted during the entire case and his assistance was crucial for facilitating all aspects of this case.     [name]. [name], M.D.     CYB/MEDQ  D:  11/23/2014  T:  11/24/2014  Job:  [id number]

## 2014-11-25 ENCOUNTER — Encounter (HOSPITAL_COMMUNITY): Payer: Self-pay | Admitting: General Practice

## 2014-11-25 LAB — CBC
HCT: 32.6 % — ABNORMAL LOW (ref 36.0–46.0)
Hemoglobin: 10.9 g/dL — ABNORMAL LOW (ref 12.0–15.0)
MCH: 30.5 pg (ref 26.0–34.0)
MCHC: 33.4 g/dL (ref 30.0–36.0)
MCV: 91.3 fL (ref 78.0–100.0)
PLATELETS: 197 10*3/uL (ref 150–400)
RBC: 3.57 MIL/uL — AB (ref 3.87–5.11)
RDW: 14.3 % (ref 11.5–15.5)
WBC: 10.9 10*3/uL — ABNORMAL HIGH (ref 4.0–10.5)

## 2014-11-25 NOTE — Clinical Social Work Note (Signed)
CSW presented SNF bed offers to patient at bedside.  Per patient, patient still considering home versus SNF placement.  CSW to follow-up with patient on 11/25/2014 afternoon regarding discharge disposition choice.  Marcelline Deist, [place] - [phone] Clinical Social Work Department Orthopedics [phone]) and Surgical [phone])

## 2014-11-25 NOTE — Progress Notes (Signed)
OT Cancellation Note Per SW, pt planning to d/c to SNF tomorrow. Will defer OT to SNF.   Hurshel Party, M.S., OTR/L Pager: [phone]  11/25/14 2:48 PM

## 2014-11-25 NOTE — Progress Notes (Signed)
Physical Therapy Treatment Patient Details Name: Vanessa Palmer MRN: [id number] DOB: March 15, 1950 Today's Date: 11/25/2014    History of Present Illness Patient is a 64 y/o female s/p R THA. PMH includes HTN, HLD, gout, depression, CAD, CHF, CKD, DVT, seizures.     PT Comments    Pt progressing slowly. Mobility limited by pain. Continue per POC. Recommending ST SNF at d/c.  Follow Up Recommendations  SNF     Equipment Recommendations  None recommended by PT    Recommendations for Other Services       Precautions / Restrictions Precautions Precautions: Fall Restrictions Weight Bearing Restrictions: Yes RLE Weight Bearing: Weight bearing as tolerated    Mobility  Bed Mobility         Supine to sit: Min assist;HOB elevated     General bed mobility comments: assist for RLE. Verbal cues for sequencing.  Transfers   Equipment used: Rolling walker (2 wheeled)   Sit to Stand: Min assist Stand pivot transfers: Min assist       General transfer comment: Assist to power up. Verbal cues for hand placement.  Ambulation/Gait Ambulation/Gait assistance: Min assist Ambulation Distance (Feet): 5 Feet Assistive device: Rolling walker (2 wheeled) Gait Pattern/deviations: Step-to pattern;Antalgic;Trunk flexed Gait velocity: very slow   General Gait Details: Heavily reliant on RW. Verbal cues for RW management, sequencing and posture. Distance limited by pain.   Stairs            Wheelchair Mobility    Modified Rankin (Stroke Patients Only)       Balance                                    Cognition Arousal/Alertness: Awake/alert Behavior During Therapy: Flat affect Overall Cognitive Status: Within Functional Limits for tasks assessed                      Exercises Total Joint Exercises Ankle Circles/Pumps: AROM;Both;10 reps Gluteal Sets: AROM;Both;10 reps Heel Slides: AAROM;Right;10 reps Hip ABduction/ADduction: AAROM;Right;10  reps    General Comments        Pertinent Vitals/Pain Pain Assessment: 0-10 Pain Score: 5  Pain Location: R hip Pain Descriptors / Indicators: Sore Pain Intervention(s): Limited activity within patient's tolerance;Repositioned;Premedicated before session    Home Living                      Prior Function            PT Goals (current goals can now be found in the care plan section) Acute Rehab PT Goals Patient Stated Goal: rehab before going home PT Goal Formulation: With patient Time For Goal Achievement: 12/08/14 Potential to Achieve Goals: Fair Progress towards PT goals: Progressing toward goals    Frequency  7X/week    PT Plan Current plan remains appropriate    Co-evaluation             End of Session Equipment Utilized During Treatment: Gait belt Activity Tolerance: Patient limited by pain Patient left: in chair;with call bell/phone within reach     Time: 1010-1034 PT Time Calculation (min) (ACUTE ONLY): 24 min  Charges:  $Gait Training: 8-22 mins $Therapeutic Exercise: 8-22 mins                    G Codes:      Ilda Foil 11/25/2014, 11:47 AM

## 2014-11-25 NOTE — Progress Notes (Signed)
Subjective: 2 Days Post-Op Procedure(s) (LRB): RIGHT TOTAL HIP ARTHROPLASTY ANTERIOR APPROACH (Right) Patient reports pain as moderate.  Asymptomatic acute blood loss anemia.  Slow mobility with PT.  Objective: Vital signs in last 24 hours: Temp:  [98.3 F (36.8 C)-98.4 F (36.9 C)] 98.4 F (36.9 C) (09/29 0551) Pulse Rate:  [73-83] 82 (09/29 0551) Resp:  [18-20] 18 (09/29 0551) BP: (99-152)/(72-97) 140/97 mmHg (09/29 0551) SpO2:  [95 %-98 %] 95 % (09/29 0551)  Intake/Output from previous day: 09/28 0701 - 09/29 0700 In: 730 [P.O.:730] Out: 600 [Urine:600] Intake/Output this shift: Total I/O In: -  Out: 600 [Urine:600]   Recent Labs  11/24/14 0540 11/25/14 0349  HGB 11.5* 10.9*    Recent Labs  11/24/14 0540 11/25/14 0349  WBC 9.2 10.9*  RBC 3.73* 3.57*  HCT 34.1* 32.6*  PLT 202 197    Recent Labs  11/24/14 0540  NA 136  K 4.4  CL 98*  CO2 29  BUN 16  CREATININE 1.27*  GLUCOSE 123*  CALCIUM 8.4*    Recent Labs  11/23/14 1320  INR 1.10    Sensation intact distally Intact pulses distally Dorsiflexion/Plantar flexion intact Incision: scant drainage  Assessment/Plan: 2 Days Post-Op Procedure(s) (LRB): RIGHT TOTAL HIP ARTHROPLASTY ANTERIOR APPROACH (Right) Up with therapy Plan for discharge tomorrow Discharge to SNF likely.  Kathryne Hitch 11/25/2014, 6:36 AM

## 2014-11-26 DIAGNOSIS — K219 Gastro-esophageal reflux disease without esophagitis: Secondary | ICD-10-CM | POA: Diagnosis not present

## 2014-11-26 DIAGNOSIS — M6281 Muscle weakness (generalized): Secondary | ICD-10-CM | POA: Diagnosis not present

## 2014-11-26 DIAGNOSIS — R32 Unspecified urinary incontinence: Secondary | ICD-10-CM | POA: Diagnosis not present

## 2014-11-26 DIAGNOSIS — I82403 Acute embolism and thrombosis of unspecified deep veins of lower extremity, bilateral: Secondary | ICD-10-CM | POA: Diagnosis not present

## 2014-11-26 DIAGNOSIS — Z96649 Presence of unspecified artificial hip joint: Secondary | ICD-10-CM | POA: Diagnosis not present

## 2014-11-26 DIAGNOSIS — I2510 Atherosclerotic heart disease of native coronary artery without angina pectoris: Secondary | ICD-10-CM | POA: Diagnosis not present

## 2014-11-26 DIAGNOSIS — I10 Essential (primary) hypertension: Secondary | ICD-10-CM | POA: Diagnosis not present

## 2014-11-26 DIAGNOSIS — I82503 Chronic embolism and thrombosis of unspecified deep veins of lower extremity, bilateral: Secondary | ICD-10-CM | POA: Diagnosis not present

## 2014-11-26 DIAGNOSIS — M1990 Unspecified osteoarthritis, unspecified site: Secondary | ICD-10-CM | POA: Diagnosis not present

## 2014-11-26 DIAGNOSIS — D62 Acute posthemorrhagic anemia: Secondary | ICD-10-CM | POA: Diagnosis not present

## 2014-11-26 DIAGNOSIS — M25551 Pain in right hip: Secondary | ICD-10-CM | POA: Diagnosis not present

## 2014-11-26 DIAGNOSIS — N183 Chronic kidney disease, stage 3 (moderate): Secondary | ICD-10-CM | POA: Diagnosis not present

## 2014-11-26 DIAGNOSIS — Z96641 Presence of right artificial hip joint: Secondary | ICD-10-CM | POA: Diagnosis not present

## 2014-11-26 DIAGNOSIS — M1611 Unilateral primary osteoarthritis, right hip: Secondary | ICD-10-CM | POA: Diagnosis not present

## 2014-11-26 DIAGNOSIS — R278 Other lack of coordination: Secondary | ICD-10-CM | POA: Diagnosis not present

## 2014-11-26 DIAGNOSIS — R2681 Unsteadiness on feet: Secondary | ICD-10-CM | POA: Diagnosis not present

## 2014-11-26 DIAGNOSIS — R609 Edema, unspecified: Secondary | ICD-10-CM | POA: Diagnosis not present

## 2014-11-26 DIAGNOSIS — Z471 Aftercare following joint replacement surgery: Secondary | ICD-10-CM | POA: Diagnosis not present

## 2014-11-26 DIAGNOSIS — J45901 Unspecified asthma with (acute) exacerbation: Secondary | ICD-10-CM | POA: Diagnosis not present

## 2014-11-26 DIAGNOSIS — I129 Hypertensive chronic kidney disease with stage 1 through stage 4 chronic kidney disease, or unspecified chronic kidney disease: Secondary | ICD-10-CM | POA: Diagnosis not present

## 2014-11-26 MED ORDER — TIZANIDINE HCL 4 MG PO TABS: 4.0000 mg | ORAL_TABLET | Freq: Four times a day (QID) | ORAL | Status: DC | PRN

## 2014-11-26 MED ORDER — OXYCODONE-ACETAMINOPHEN 5-325 MG PO TABS: 1.0000 | ORAL_TABLET | ORAL | Status: DC | PRN

## 2014-11-26 NOTE — Care Management Note (Addendum)
Case Management Note  Patient Details  Name: HAILLEY BYERS MRN: [id number] Date of Birth: 1950-08-30  Subjective/Objective:           S/p right total hip arthroplasty         Action/Plan: PT/OT recommended SNF. Referral made to CSW, per CSW patient to discharge to [facility].  Expected Discharge Date:                  Expected Discharge Plan:  Skilled Nursing Facility  In-House Referral:  Clinical Social Work  Discharge planning Services  CM Consult  Post Acute Care Choice:    Choice offered to:     DME Arranged:    DME Agency:     HH Arranged:    HH Agency:     Status of Service:  Completed, signed off  Medicare Important Message Given:    Date Medicare IM Given:    Medicare IM give by:    Date Additional Medicare IM Given:    Additional Medicare Important Message give by:     If discussed at Long Length of Stay Meetings, dates discussed:    Additional Comments:  Monica Becton, RN 11/26/2014, 11:05 AM

## 2014-11-26 NOTE — Progress Notes (Signed)
Patient ID: Vanessa Palmer, female   DOB: 08/25/1950, 64 y.o.   MRN: [id number] Doing well.  Vitals stable.  Right hip stable.  Says she would rather go to SNF.  Can be discharged today.

## 2014-11-26 NOTE — Discharge Summary (Signed)
Patient ID: Vanessa Palmer MRN: [id number] DOB/AGE: 03-Oct-1950 64 y.o.  Admit date: 11/23/2014 Discharge date: 11/26/2014  Admission Diagnoses:  Principal Problem:   Osteoarthritis of right hip Active Problems:   Status post total replacement of right hip   Discharge Diagnoses:  Same  Past Medical History  Diagnosis Date  . Hyperlipidemia   . Hypertension   . Gout   . Obesity   . Depression   . DJD (degenerative joint disease)   . OSA (obstructive sleep apnea)     previously used CPAP, has lost  . CAD (coronary artery disease)     s/p non-Q wave MI in 06/2001 and 2004, 2005  . ALCOHOL ABUSE 12/13/2005    Annotation: Sober since 11/06 Qualifier: Diagnosis of  By: Wallace Cullens MD, Natalia Leatherwood    . INTRINSIC ASTHMA, WITH EXACERBATION 09/27/2009    Qualifier: Diagnosis of  By: Denton Meek MD, Tillie Rung    . Chronic kidney disease (CKD), stage III (moderate) 09/19/2010  . CHF (congestive heart failure)   . DVT of lower extremity, bilateral 04/04/2012    On Xarelto    . GERD (gastroesophageal reflux disease)   . Seizures     As a teenager   . Headache     migraines  . Anemia     Surgeries: Procedure(s): RIGHT TOTAL HIP ARTHROPLASTY ANTERIOR APPROACH on 11/23/2014   Consultants:    Discharged Condition: Improved  Hospital Course: Vanessa Palmer is an 64 y.o. female who was admitted 11/23/2014 for operative treatment ofOsteoarthritis of right hip. Patient has severe unremitting pain that affects sleep, daily activities, and work/hobbies. After pre-op clearance the patient was taken to the operating room on 11/23/2014 and underwent  Procedure(s): RIGHT TOTAL HIP ARTHROPLASTY ANTERIOR APPROACH.    Patient was given perioperative antibiotics: Anti-infectives    Start     Dose/Rate Route Frequency Ordered Stop   11/23/14 2100  ceFAZolin (ANCEF) IVPB 1 g/50 mL premix     1 g 100 mL/hr over 30 Minutes Intravenous Every 6 hours 11/23/14 2016 11/24/14 0359   11/23/14 1430  ceFAZolin (ANCEF) IVPB  2 g/50 mL premix     2 g 100 mL/hr over 30 Minutes Intravenous To ShortStay Surgical 11/22/14 1422 11/23/14 1517       Patient was given sequential compression devices, early ambulation, and chemoprophylaxis to prevent DVT.  Patient benefited maximally from hospital stay and there were no complications.    Recent vital signs: Patient Vitals for the past 24 hrs:  BP Temp Temp src Pulse Resp SpO2  11/26/14 0448 137/67 mmHg 97.8 F (36.6 C) Oral 61 18 94 %  11/25/14 1940 122/82 mmHg 98.3 F (36.8 C) Oral 70 18 100 %  11/25/14 1300 105/78 mmHg 98.1 F (36.7 C) Oral 68 18 95 %  11/25/14 0948 (!) 140/97 mmHg - - - - -  11/25/14 0947 (!) 140/97 mmHg - - 82 - -     Recent laboratory studies:  Recent Labs  11/23/14 1320 11/24/14 0540 11/25/14 0349  WBC  --  9.2 10.9*  HGB  --  11.5* 10.9*  HCT  --  34.1* 32.6*  PLT  --  202 197  NA  --  136  --   K  --  4.4  --   CL  --  98*  --   CO2  --  29  --   BUN  --  16  --   CREATININE  --  1.27*  --   GLUCOSE  --  123*  --   INR 1.10  --   --   CALCIUM  --  8.4*  --      Discharge Medications:     Medication List    TAKE these medications        acetaminophen 500 MG tablet  Commonly known as:  TYLENOL  Take 1,000 mg by mouth every 6 (six) hours as needed for moderate pain.     amLODipine 10 MG tablet  Commonly known as:  NORVASC  TAKE 1 TABLET (10 MG TOTAL) BY MOUTH DAILY.     atorvastatin 40 MG tablet  Commonly known as:  LIPITOR  TAKE 1 TABLET (40 MG TOTAL) BY MOUTH DAILY.     b complex vitamins tablet  Take 1 tablet by mouth daily.     buPROPion 150 MG 24 hr tablet  Commonly known as:  WELLBUTRIN XL  Take 1 tablet (150 mg total) by mouth every morning.     famotidine 20 MG tablet  Commonly known as:  PEPCID  Take 1 tablet (20 mg total) by mouth 2 (two) times daily.     fluticasone 44 MCG/ACT inhaler  Commonly known as:  FLOVENT HFA  Inhale 2 puffs into the lungs 2 (two) times daily.     fluticasone 50  MCG/ACT nasal spray  Commonly known as:  FLONASE  INSTILL TWO   SPRAYS IN EACH NOSTRIL ONCE DAILY     furosemide 20 MG tablet  Commonly known as:  LASIX  Take 2 tablets (40 mg total) by mouth daily.     metoprolol 50 MG tablet  Commonly known as:  LOPRESSOR  Take 1 tablet (50 mg total) by mouth daily.     nitroGLYCERIN 0.4 MG SL tablet  Commonly known as:  NITROSTAT  Place 0.4 mg under the tongue every 5 (five) minutes as needed for chest pain.     oxyCODONE-acetaminophen 10-325 MG tablet  Commonly known as:  PERCOCET  Take 1 tablet by mouth every 8 (eight) hours as needed for pain.     oxyCODONE-acetaminophen 5-325 MG tablet  Commonly known as:  ROXICET  Take 1-2 tablets by mouth every 4 (four) hours as needed.     predniSONE 50 MG tablet  Commonly known as:  DELTASONE  Take 50 mg by mouth daily with breakfast.     ranitidine 150 MG tablet  Commonly known as:  ZANTAC  Take 1 tablet (150 mg total) by mouth 2 (two) times daily.     rivaroxaban 20 MG Tabs tablet  Commonly known as:  XARELTO  TAKE 1 TABLET (20 MG TOTAL) BY MOUTH DAILY WITH SUPPER.     tiZANidine 4 MG tablet  Commonly known as:  ZANAFLEX  Take 1 tablet (4 mg total) by mouth every 6 (six) hours as needed for muscle spasms.     TOVIAZ 8 MG Tb24 tablet  Generic drug:  fesoterodine  Take 8 mg by mouth Daily.     traZODone 50 MG tablet  Commonly known as:  DESYREL  TAKE 0.5 TABLETS (25 MG TOTAL) BY MOUTH AT BEDTIME.        Diagnostic Studies: Dg Hip Port Unilat With Pelvis 1v Right  11/23/2014   CLINICAL DATA:  Right hip replacement  EXAM: DG HIP (WITH OR WITHOUT PELVIS) 1V PORT RIGHT  COMPARISON:  None.  FINDINGS: There is a total right hip arthroplasty without failure or complication. There is no acute fracture or dislocation. There are postsurgical changes around the right  hip.  IMPRESSION: Right total hip arthroplasty.   Electronically Elige KoBy: Hetal  Patel   On: 11/23/2014 18:19   Dg Hip  Operative Unilat With Pelvis Right  11/23/2014   CLINICAL DATA:  Right hip replacement  EXAM: OPERATIVE right HIP (WITH PELVIS IF PERFORMED) 4 VIEWS  TECHNIQUE: Fluoroscopic spot image(s) were submitted for interpretation post-operatively.  COMPARISON:  10/12/2014  FINDINGS: A right hip replacement is now seen. No acute bony abnormality is seen. No soft tissue changes are noted.  IMPRESSION: Status post right hip replacement   Electronically Signed   By: Alcide Clever M.D.   On: 11/23/2014 16:55    Disposition:  To skilled nursing      Discharge Instructions    Discharge patient    Complete by:  As directed            Follow-up Information    Follow up with Kathryne Hitch, MD In 2 weeks.   Specialty:  Orthopedic Surgery   Contact information:   [address] [place] [address] [phone]        Signed: Kathryne Hitch 11/26/2014, 6:52 AM

## 2014-11-26 NOTE — Clinical Social Work Placement (Signed)
   CLINICAL SOCIAL WORK PLACEMENT  NOTE  Date:  11/26/2014  Patient Details  Name: Vanessa Palmer MRN: [id number] Date of Birth: [birth year]  Clinical Social Work is seeking post-discharge placement for this patient at the Skilled  Nursing Facility level of care (*CSW will initial, date and re-position this form in  chart as items are completed):  Yes   Patient/family provided with [facility] Clinical Social Work Department's list of facilities offering this level of care within the geographic area requested by the patient (or if unable, by the patient's family).  Yes   Patient/family informed of their freedom to choose among providers that offer the needed level of care, that participate in Medicare, Medicaid or managed care program needed by the patient, have an available bed and are willing to accept the patient.  Yes   Patient/family informed of [facility]'s ownership interest in [facility] and [facility], as well as of the fact that they are under no obligation to receive care at these facilities.  PASRR submitted to EDS on 11/24/14     PASRR number received on 11/24/14     Existing PASRR number confirmed on  (n/a)     FL2 transmitted to all facilities in geographic area requested by pt/family on 11/24/14     FL2 transmitted to all facilities within larger geographic area on  (n/a)     Patient informed that his/her managed care company has contracts with or will negotiate with certain facilities, including the following:   (yes, [facility])     Yes   Patient/family informed of bed offers received.  Patient chooses bed at [facility]     Physician recommends and patient chooses bed at      Patient to be transferred to [facility] on 11/26/14.  Patient to be transferred to facility by [facility]     Patient family notified on 11/26/14 of transfer.  Name of family member notified:  Patient updated at bedside.     PHYSICIAN        Additional Comment:    _______________________________________________ Rod Mae, LCSW 11/26/2014, 11:56 AM

## 2014-11-26 NOTE — Discharge Planning (Signed)
Patient to be discharged to [facility]. CSW updated patient at bedside.  Facility: [facility] RN report number: [phone] Transportation: EMS  Marcelline Deist, [place] [phone] Clinical Social Work Department Orthopedics [phone]) and Surgical [phone])

## 2014-11-26 NOTE — Discharge Instructions (Signed)

## 2014-11-26 NOTE — Progress Notes (Signed)
Physical Therapy Treatment Patient Details Name: Vanessa Palmer MRN: [id number] DOB: 05-30-1950 Today's Date: 11/26/2014    History of Present Illness Patient is a 64 y/o female s/p R THA. PMH includes HTN, HLD, gout, depression, CAD, CHF, CKD, DVT, seizures.     PT Comments    Pt continues to make slow, but steady progress. Pt has significant R hip weakness and difficulty advancing her R foot with gait. Pt was able to walk with min guard 6'x2 with RW. Pt requires min assist to complete exercises. Agree with SNF recommendation. Pt will continue to benefit from skilled PT to facilitate increased independence for improved safety and mobility.  Follow Up Recommendations  SNF     Equipment Recommendations  None recommended by PT    Recommendations for Other Services       Precautions / Restrictions Precautions Precautions: Fall Precaution Comments: direct anterior approach Restrictions RLE Weight Bearing: Weight bearing as tolerated    Mobility  Bed Mobility Overal bed mobility: Needs Assistance Bed Mobility: Supine to Sit     Supine to sit: Supervision;HOB elevated        Transfers Overall transfer level: Needs assistance Equipment used: Rolling walker (2 wheeled) Transfers: Sit to/from Stand Sit to Stand: Min guard Stand pivot transfers: Min guard       General transfer comment: cues for technique, increased effort  Ambulation/Gait Ambulation/Gait assistance: Min guard Ambulation Distance (Feet): 6 Feet (2 trials-6'x2) Assistive device: Rolling walker (2 wheeled) Gait Pattern/deviations: Step-to pattern;Decreased stance time - left;Decreased stride length;Decreased weight shift to right;Trunk flexed   Gait velocity interpretation: Below normal speed for age/gender General Gait Details: Pt with significant Hip flexor weakness, pt able to slide R foot forward to advance foot to step. Reinforced a more upright posture to unweight R LE to assist with R LE  advancement.   Stairs            Wheelchair Mobility    Modified Rankin (Stroke Patients Only)       Balance Overall balance assessment: Needs assistance Sitting-balance support: No upper extremity supported Sitting balance-Leahy Scale: Fair     Standing balance support: Bilateral upper extremity supported Standing balance-Leahy Scale: Fair                      Cognition Arousal/Alertness: Awake/alert Behavior During Therapy: Flat affect Overall Cognitive Status: Within Functional Limits for tasks assessed                      Exercises Total Joint Exercises Ankle Circles/Pumps: AROM;Strengthening;Both;10 reps;Seated Heel Slides: AAROM;Right;10 reps;Seated Long Arc Quad: AAROM;Strengthening;Right;10 reps;Seated Marching in Standing: AROM;Strengthening;Right;5 reps;Standing (able to achieve only a 5-10* bend in knee during attempt to march)    General Comments General comments (skin integrity, edema, etc.): Max cues for relaxation and controlling breathing. Pt tended to take short quick breaths during functional movment.      Pertinent Vitals/Pain Pain Assessment: 0-10 Pain Score: 5  Pain Location: R Hip Pain Descriptors / Indicators: Discomfort Pain Intervention(s): Limited activity within patient's tolerance;Monitored during session;Relaxation;Repositioned    Home Living                      Prior Function            PT Goals (current goals can now be found in the care plan section) Progress towards PT goals: Progressing toward goals    Frequency  7X/week    PT  Plan Current plan remains appropriate    Co-evaluation             End of Session Equipment Utilized During Treatment: Gait belt Activity Tolerance: Patient limited by pain Patient left: in chair;with call bell/phone within reach     Time: 0840-0911 PT Time Calculation (min) (ACUTE ONLY): 31 min  Charges:  $Gait Training: 8-22 mins $Therapeutic  Exercise: 8-22 mins                    G Codes:      Greggory Stallion 11/26/2014, 9:14 AM

## 2014-11-26 NOTE — Care Management Important Message (Signed)
Important Message  Patient Details  Name: Vanessa Palmer MRN: [id number] Date of Birth: May 05, 1950   Medicare Important Message Given:  Yes-second notification given    Orson Aloe 11/26/2014, 3:13 PM

## 2014-11-26 NOTE — Progress Notes (Signed)
[name] discharged to SNF [facility]) per MD order. All questions and concerns answered. Copy of instructions and scripts sent with patient. Patient's belongings (balloons and flowers) will be picked up by family member.  Patient escorted by [facility]. No distress noted upon discharge.   Lorin Picket [place] R 11/26/2014 2:03 PM

## 2014-11-30 ENCOUNTER — Non-Acute Institutional Stay (SKILLED_NURSING_FACILITY): Payer: Medicare Other | Admitting: Internal Medicine

## 2014-11-30 DIAGNOSIS — Z96649 Presence of unspecified artificial hip joint: Secondary | ICD-10-CM | POA: Diagnosis not present

## 2014-11-30 DIAGNOSIS — E785 Hyperlipidemia, unspecified: Secondary | ICD-10-CM | POA: Diagnosis not present

## 2014-11-30 DIAGNOSIS — Z96641 Presence of right artificial hip joint: Secondary | ICD-10-CM

## 2014-11-30 DIAGNOSIS — I10 Essential (primary) hypertension: Secondary | ICD-10-CM | POA: Diagnosis not present

## 2014-11-30 DIAGNOSIS — K219 Gastro-esophageal reflux disease without esophagitis: Secondary | ICD-10-CM

## 2014-11-30 DIAGNOSIS — I82503 Chronic embolism and thrombosis of unspecified deep veins of lower extremity, bilateral: Secondary | ICD-10-CM | POA: Diagnosis not present

## 2014-11-30 DIAGNOSIS — D62 Acute posthemorrhagic anemia: Secondary | ICD-10-CM | POA: Diagnosis not present

## 2014-12-01 ENCOUNTER — Encounter: Payer: Self-pay | Admitting: Internal Medicine

## 2014-12-01 NOTE — Progress Notes (Signed)
MRN: [id number] Name: Vanessa Palmer  Sex: female Age: 64 y.o. DOB: [birth year]  [facility] Facility/Room:103 Level Of Care: SNF Provider: Merrilee Seashore D Emergency Contacts: Extended Emergency Contact Information Primary Emergency Contact: David,Maryanne Address: [address]          [place], [place] [address] [place] Home Phone: [phone] Relation: Sister  Code Status:   Allergies: Ace inhibitors; Lexiscan; Peanuts; Hydromorphone hcl; Percocet; Propoxyphene n-acetaminophen; and Vicodin  Chief Complaint  Patient presents with  . New Admit To SNF    HPI: Patient is 64 y.o. female with HTN, HLD,CAD, OSA, who was admitted to the hospital from 9/27-30 for a R hip arthroplasty 2/2 endstage OA. Pt is admitted to SNF for OT/PT. While at SNF pt will be followed for HTN, tx with norvasc, metoprolol and lasix, GERD, tx with pepcid and HLD, tx with lipitor. Of note pt was founf to be on prednisone 50 mg daily, from search thru records from at least August. Pt doesn't know why, no documentation why. I will taper it 40/30/20/10 every 10 days down to 10 mg at which point PCP can follow.  Past Medical History  Diagnosis Date  . Hyperlipidemia   . Hypertension   . Gout   . Obesity   . Depression   . DJD (degenerative joint disease)   . OSA (obstructive sleep apnea)     previously used CPAP, has lost  . CAD (coronary artery disease)     s/p non-Q wave MI in 06/2001 and 2004, 2005  . ALCOHOL ABUSE 12/13/2005    Annotation: Sober since 11/06 Qualifier: Diagnosis of  By: Wallace Cullens MD, Natalia Leatherwood    . INTRINSIC ASTHMA, WITH EXACERBATION 09/27/2009    Qualifier: Diagnosis of  By: Denton Meek MD, Tillie Rung    . Chronic kidney disease (CKD), stage III (moderate) 09/19/2010  . CHF (congestive heart failure) (HCC)   . DVT of lower extremity, bilateral (HCC) 04/04/2012    On Xarelto    . GERD (gastroesophageal reflux disease)   . Seizures (HCC)     As a teenager   . Headache    migraines  . Anemia     Past Surgical History  Procedure Laterality Date  . Partial hysterectomy      due to endometriosis  . Tubal ligation    . Cardiac catheterization      06/2001, 02/2002, 10/2004 (10-15% proximal stenosis of circumflex, diffuse disease of  OM1 and RCA)  . Knee arthroscopy Left   . Colonoscopy    . Total hip arthroplasty Right 11/23/2014    Procedure: RIGHT TOTAL HIP ARTHROPLASTY ANTERIOR APPROACH;  Surgeon: Kathryne Hitch, MD;  Location: [facility] OR;  Service: Orthopedics;  Laterality: Right;      Medication List       This list is accurate as of: 11/30/14 11:59 PM.  Always use your most recent med list.               acetaminophen 500 MG tablet  Commonly known as:  TYLENOL  Take 1,000 mg by mouth every 6 (six) hours as needed for moderate pain.     amLODipine 10 MG tablet  Commonly known as:  NORVASC  TAKE 1 TABLET (10 MG TOTAL) BY MOUTH DAILY.     atorvastatin 40 MG tablet  Commonly known as:  LIPITOR  TAKE 1 TABLET (40 MG TOTAL) BY MOUTH DAILY.     b complex vitamins tablet  Take 1 tablet by mouth daily.  buPROPion 150 MG 24 hr tablet  Commonly known as:  WELLBUTRIN XL  Take 1 tablet (150 mg total) by mouth every morning.     famotidine 20 MG tablet  Commonly known as:  PEPCID  Take 1 tablet (20 mg total) by mouth 2 (two) times daily.     fluticasone 44 MCG/ACT inhaler  Commonly known as:  FLOVENT HFA  Inhale 2 puffs into the lungs 2 (two) times daily.     fluticasone 50 MCG/ACT nasal spray  Commonly known as:  FLONASE  INSTILL TWO   SPRAYS IN EACH NOSTRIL ONCE DAILY     furosemide 20 MG tablet  Commonly known as:  LASIX  Take 2 tablets (40 mg total) by mouth daily.     metoprolol 50 MG tablet  Commonly known as:  LOPRESSOR  Take 1 tablet (50 mg total) by mouth daily.     nitroGLYCERIN 0.4 MG SL tablet  Commonly known as:  NITROSTAT  Place 0.4 mg under the tongue every 5 (five) minutes as needed for chest pain.      oxyCODONE-acetaminophen 10-325 MG tablet  Commonly known as:  PERCOCET  Take 1 tablet by mouth every 8 (eight) hours as needed for pain.     oxyCODONE-acetaminophen 5-325 MG tablet  Commonly known as:  ROXICET  Take 1-2 tablets by mouth every 4 (four) hours as needed.     predniSONE 50 MG tablet  Commonly known as:  DELTASONE  Take 50 mg by mouth daily with breakfast.     ranitidine 150 MG tablet  Commonly known as:  ZANTAC  Take 1 tablet (150 mg total) by mouth 2 (two) times daily.     rivaroxaban 20 MG Tabs tablet  Commonly known as:  XARELTO  TAKE 1 TABLET (20 MG TOTAL) BY MOUTH DAILY WITH SUPPER.     tiZANidine 4 MG tablet  Commonly known as:  ZANAFLEX  Take 1 tablet (4 mg total) by mouth every 6 (six) hours as needed for muscle spasms.     TOVIAZ 8 MG Tb24 tablet  Generic drug:  fesoterodine  Take 8 mg by mouth Daily.     traZODone 50 MG tablet  Commonly known as:  DESYREL  TAKE 0.5 TABLETS (25 MG TOTAL) BY MOUTH AT BEDTIME.        No orders of the defined types were placed in this encounter.    There is no immunization history for the selected administration types on file for this patient.  Social History  Substance Use Topics  . Smoking status: Never Smoker   . Smokeless tobacco: Never Used  . Alcohol Use: No     Comment: last alcohol use 02/27/14    Family history is + DM2, HD  Review of Systems  DATA OBTAINED: from patient, nurse GENERAL:  no fevers, fatigue, appetite changes SKIN: No itching, rash or wounds EYES: No eye pain, redness, discharge EARS: No earache, tinnitus, change in hearing NOSE: No congestion, drainage or bleeding  MOUTH/THROAT: No mouth or tooth pain, No sore throat RESPIRATORY: No cough, wheezing, SOB CARDIAC: No chest pain, palpitations, lower extremity edema  GI: No abdominal pain, No N/V/D or constipation, No heartburn or reflux  GU: No dysuria, frequency or urgency, or incontinence  MUSCULOSKELETAL: No unrelieved bone/joint  pain NEUROLOGIC: No headache, dizziness or focal weakness PSYCHIATRIC: No c/o anxiety or sadness   Filed Vitals:   12/01/14 0846  BP: 120/83  Pulse: 79  Temp: 98.2 F (36.8 C)  Resp:  20    SpO2 Readings from Last 1 Encounters:  11/26/14 94%        Physical Exam  GENERAL APPEARANCE: Alert, conversant,  No acute distress.  SKIN: No diaphoresis rash HEAD: Normocephalic, atraumatic  EYES: Conjunctiva/lids clear. Pupils round, reactive. EOMs intact.  EARS: External exam WNL, canals clear. Hearing grossly normal.  NOSE: No deformity or discharge.  MOUTH/THROAT: Lips w/o lesions  RESPIRATORY: Breathing is even, unlabored. Lung sounds are clear   CARDIOVASCULAR: Heart RRR no murmurs, rubs or gallops. No peripheral edema.   GASTROINTESTINAL: Abdomen is soft, non-tender, not distended w/ normal bowel sounds. GENITOURINARY: Bladder non tender, not distended  MUSCULOSKELETAL: No abnormal joints or musculature NEUROLOGIC:  Cranial nerves 2-12 grossly intact. Moves all extremities  PSYCHIATRIC:odd affect, no behavioral issues  Patient Active Problem List   Diagnosis Date Noted  . Status post total replacement of right hip 11/23/2014  . Osteoarthritis of right hip 10/13/2014  . Postoperative anemia due to acute blood loss 08/11/2014  . Leg cramping 01/06/2014  . Other screening mammogram 09/09/2013  . Shingles 11/19/2012  . Miliaria 07/16/2012  . DVT of lower extremity, bilateral (HCC) 04/04/2012  . Routine health maintenance 01/14/2012  . GERD (gastroesophageal reflux disease) 01/09/2012  . Urinary incontinence 12/31/2011  . Chronic kidney disease (CKD), stage III (moderate) 09/19/2010  . Pes planus, congenital 08/08/2010  . Chronic pelvic pain in female 02/03/2010  . Asthma, chronic 09/27/2009  . Elevated alkaline phosphatase level 08/01/2009  . Pain in joint, multiple sites 12/21/2008  . MAJOR DPRSV DISORDER RECURRENT EPISODE MODERATE 09/08/2008  . Gout 07/05/2008  .  Hyperlipidemia 05/07/2006  . Coronary atherosclerosis 02/05/2006  . Morbid obesity (HCC) 12/13/2005  . History of alcohol abuse 12/13/2005  . SLEEP APNEA, OBSTRUCTIVE 12/13/2005  . Essential hypertension 12/13/2005  . Pre-diabetes 12/13/2005    CBC    Component Value Date/Time   WBC 10.9* 11/25/2014 0349   RBC 3.57* 11/25/2014 0349   RBC 3.73* 08/20/2008 0555   HGB 10.9* 11/25/2014 0349   HCT 32.6* 11/25/2014 0349   PLT 197 11/25/2014 0349   MCV 91.3 11/25/2014 0349   LYMPHSABS 1.4 10/12/2014 1345   MONOABS 0.3 10/12/2014 1345   EOSABS 0.2 10/12/2014 1345   BASOSABS 0.0 10/12/2014 1345    CMP     Component Value Date/Time   NA 136 11/24/2014 0540   K 4.4 11/24/2014 0540   CL 98* 11/24/2014 0540   CO2 29 11/24/2014 0540   GLUCOSE 123* 11/24/2014 0540   BUN 16 11/24/2014 0540   CREATININE 1.27* 11/24/2014 0540   CREATININE 1.66* 08/11/2014 1200   CALCIUM 8.4* 11/24/2014 0540   PROT 7.4 10/12/2014 1345   ALBUMIN 3.6 10/12/2014 1345   AST 24 10/12/2014 1345   ALT 14 10/12/2014 1345   ALKPHOS 93 10/12/2014 1345   BILITOT 1.1 10/12/2014 1345   GFRNONAA 45* 11/24/2014 0540   GFRNONAA 33* 08/11/2014 1200   GFRAA 52* 11/24/2014 0540   GFRAA 38* 08/11/2014 1200    Lab Results  Component Value Date   HGBA1C 5.7 01/06/2014     No results found.  Not all labs, radiology exams or other studies done during hospitalization come through on my EPIC note; however they are reviewed by me.    Assessment and Plan  Status post total replacement of right hip For endstage OA; SNF - admitted for OT/PT  Essential hypertension SNF - controlled on Norvasc 10, metoprolol 50 mg and laix;Plan - contiue same  DVT of  lower extremity, bilateral Originated on 03/2012, B; SNF - cont xarelto  GERD (gastroesophageal reflux disease) SNF - cont pepcid 20 mg BID  Postoperative anemia due to acute blood loss SNF - pt 's post op Hb 10.9 ;check CBC for recovery  Hyperlipidemia SNF -  cont lipitor 40 mg daily   Time spent > 35 min;> 50% of time with patient was spent reviewing records, labs, tests and studies, counseling and developing plan of care  Margit Hanks, MD

## 2014-12-01 NOTE — Assessment & Plan Note (Signed)
Originated on 03/2012, B; SNF - cont xarelto

## 2014-12-01 NOTE — Assessment & Plan Note (Signed)
SNF - cont lipitor 40 mg daily 

## 2014-12-01 NOTE — Assessment & Plan Note (Signed)
>>  ASSESSMENT AND PLAN FOR STATUS POST TOTAL REPLACEMENT OF RIGHT HIP WRITTEN ON 12/01/2014  9:47 AM BY ALEXANDER, ANNE D, MD  For endstage OA; SNF - admitted for OT/PT

## 2014-12-01 NOTE — Assessment & Plan Note (Signed)
For endstage OA; SNF - admitted for OT/PT

## 2014-12-01 NOTE — Assessment & Plan Note (Signed)
SNF - pt 's post op Hb 10.9 ;check CBC for recovery

## 2014-12-01 NOTE — Assessment & Plan Note (Signed)
SNF - cont pepcid 20 mg BID 

## 2014-12-01 NOTE — Assessment & Plan Note (Signed)
SNF - controlled on Norvasc 10, metoprolol 50 mg and laix;Plan - contiue same

## 2014-12-07 DIAGNOSIS — M25551 Pain in right hip: Secondary | ICD-10-CM | POA: Diagnosis not present

## 2014-12-07 DIAGNOSIS — M1611 Unilateral primary osteoarthritis, right hip: Secondary | ICD-10-CM | POA: Diagnosis not present

## 2014-12-09 ENCOUNTER — Non-Acute Institutional Stay (SKILLED_NURSING_FACILITY): Payer: Medicare Other | Admitting: Internal Medicine

## 2014-12-09 ENCOUNTER — Other Ambulatory Visit: Payer: Self-pay | Admitting: Internal Medicine

## 2014-12-09 DIAGNOSIS — I10 Essential (primary) hypertension: Secondary | ICD-10-CM

## 2014-12-09 DIAGNOSIS — K219 Gastro-esophageal reflux disease without esophagitis: Secondary | ICD-10-CM | POA: Diagnosis not present

## 2014-12-09 DIAGNOSIS — Z96649 Presence of unspecified artificial hip joint: Secondary | ICD-10-CM

## 2014-12-09 DIAGNOSIS — I82403 Acute embolism and thrombosis of unspecified deep veins of lower extremity, bilateral: Secondary | ICD-10-CM

## 2014-12-09 DIAGNOSIS — M1990 Unspecified osteoarthritis, unspecified site: Secondary | ICD-10-CM | POA: Diagnosis not present

## 2014-12-09 DIAGNOSIS — R32 Unspecified urinary incontinence: Secondary | ICD-10-CM | POA: Diagnosis not present

## 2014-12-09 DIAGNOSIS — M2550 Pain in unspecified joint: Secondary | ICD-10-CM

## 2014-12-09 DIAGNOSIS — M1611 Unilateral primary osteoarthritis, right hip: Secondary | ICD-10-CM | POA: Diagnosis not present

## 2014-12-09 DIAGNOSIS — Z96641 Presence of right artificial hip joint: Secondary | ICD-10-CM

## 2014-12-09 DIAGNOSIS — J45901 Unspecified asthma with (acute) exacerbation: Secondary | ICD-10-CM | POA: Diagnosis not present

## 2014-12-09 DIAGNOSIS — R609 Edema, unspecified: Secondary | ICD-10-CM | POA: Diagnosis not present

## 2014-12-09 NOTE — Telephone Encounter (Signed)
Pt requesting percocet to be filled. °

## 2014-12-09 NOTE — Telephone Encounter (Signed)
Dr Magnus Ivanblackman did total hip 9/27 Oxy 10/325 #90 given by dr Criselda Peachesmullen 9/8 Oxy 5/325 #60 given by dr Magnus Ivanblackman 9/30 Admitted to rehab at [facility] 9/30 F/u appt w/ dr Criselda Peachesmullen 11/2 Last uds 12/2013

## 2014-12-09 NOTE — Progress Notes (Signed)
Patient ID: Vanessa Palmer, female   DOB: Nov 10, 1950, 64 y.o.   MRN: [id number] MRN: [id number] Name: Vanessa Palmer  Sex: female Age: 64 y.o. DOB: Jun 24, 1950  [facility] Facility/Room:103 Level Of Care: SNF Provider: Roena Malady Emergency Contacts: Extended Emergency Contact Information Primary Emergency Contact: David,Maryanne Address: [address]          [place], [place] [address] [place] Home Phone: [phone] Relation: Sister  Code Status:   Allergies: Ace inhibitors; Lexiscan; Peanuts; Hydromorphone hcl; Percocet; Propoxyphene n-acetaminophen; and Vicodin  Chief Complaint  Patient presents with  . Discharge Note    HPI: Patient is 64 y.o. female with HTN, HLD,CAD, OSA, who was admitted to the hospital from 9/27-30 for a R hip arthroplasty 2/2 endstage OA. Pt was admitted to SNF for OT/PT. While at SNF pt wi followed for HTN, tx with norvasc, metoprolol and lasix, GERD, tx with pepcid and HLD, tx with lipitor. Of note pt was founf to be on prednisone 50 mg daily, from search thru records from at least August. Pt doesn't know why, no documentation why.Dr.Alexander did  taper it 40/30/20/10 every 10 days down to 10 mg at which point PCP can follow. Her stay here has been fairly unremarkable remarkable she will need continued PT and OT for strengthening  She also will need a rolling walker for ambulation with her continued weakness  Past Medical History  Diagnosis Date  . Hyperlipidemia   . Hypertension   . Gout   . Obesity   . Depression   . DJD (degenerative joint disease)   . OSA (obstructive sleep apnea)     previously used CPAP, has lost  . CAD (coronary artery disease)     s/p non-Q wave MI in 06/2001 and 2004, 2005  . ALCOHOL ABUSE 12/13/2005    Annotation: Sober since 11/06 Qualifier: Diagnosis of  By: Wallace Cullens MD, Natalia Leatherwood    . INTRINSIC ASTHMA, WITH EXACERBATION 09/27/2009    Qualifier: Diagnosis of  By: Denton Meek MD, Tillie Rung    . Chronic  kidney disease (CKD), stage III (moderate) 09/19/2010  . CHF (congestive heart failure) (HCC)   . DVT of lower extremity, bilateral (HCC) 04/04/2012    On Xarelto    . GERD (gastroesophageal reflux disease)   . Seizures (HCC)     As a teenager   . Headache     migraines  . Anemia     Past Surgical History  Procedure Laterality Date  . Partial hysterectomy      due to endometriosis  . Tubal ligation    . Cardiac catheterization      06/2001, 02/2002, 10/2004 (10-15% proximal stenosis of circumflex, diffuse disease of  OM1 and RCA)  . Knee arthroscopy Left   . Colonoscopy    . Total hip arthroplasty Right 11/23/2014    Procedure: RIGHT TOTAL HIP ARTHROPLASTY ANTERIOR APPROACH;  Surgeon: Kathryne Hitch, MD;  Location: [facility] OR;  Service: Orthopedics;  Laterality: Right;      Medication List       This list is accurate as of: 12/09/14 11:59 PM.  Always use your most recent med list.               acetaminophen 500 MG tablet  Commonly known as:  TYLENOL  Take 1,000 mg by mouth every 6 (six) hours as needed for moderate pain.     amLODipine 10 MG tablet  Commonly known as:  NORVASC  TAKE 1  TABLET (10 MG TOTAL) BY MOUTH DAILY.     atorvastatin 40 MG tablet  Commonly known as:  LIPITOR  TAKE 1 TABLET (40 MG TOTAL) BY MOUTH DAILY.     b complex vitamins tablet  Take 1 tablet by mouth daily.     buPROPion 150 MG 24 hr tablet  Commonly known as:  WELLBUTRIN XL  Take 1 tablet (150 mg total) by mouth every morning.     famotidine 20 MG tablet  Commonly known as:  PEPCID  Take 1 tablet (20 mg total) by mouth 2 (two) times daily.     fluticasone 44 MCG/ACT inhaler  Commonly known as:  FLOVENT HFA  Inhale 2 puffs into the lungs 2 (two) times daily.     fluticasone 50 MCG/ACT nasal spray  Commonly known as:  FLONASE  INSTILL TWO   SPRAYS IN EACH NOSTRIL ONCE DAILY     furosemide 20 MG tablet  Commonly known as:  LASIX  Take 2 tablets (40 mg total) by mouth daily.      metoprolol 50 MG tablet  Commonly known as:  LOPRESSOR  Take 1 tablet (50 mg total) by mouth daily.     nitroGLYCERIN 0.4 MG SL tablet  Commonly known as:  NITROSTAT  Place 0.4 mg under the tongue every 5 (five) minutes as needed for chest pain.     oxyCODONE-acetaminophen 10-325 MG tablet  Commonly known as:  PERCOCET  Take 1 tablet by mouth every 8 (eight) hours as needed for pain.     predniSONE 50 MG tablet  Commonly known as:  DELTASONE  Take 50 mg by mouth daily with breakfast.     ranitidine 150 MG tablet  Commonly known as:  ZANTAC  Take 1 tablet (150 mg total) by mouth 2 (two) times daily.     rivaroxaban 20 MG Tabs tablet  Commonly known as:  XARELTO  TAKE 1 TABLET (20 MG TOTAL) BY MOUTH DAILY WITH SUPPER.     tiZANidine 4 MG tablet  Commonly known as:  ZANAFLEX  Take 1 tablet (4 mg total) by mouth every 6 (six) hours as needed for muscle spasms.     TOVIAZ 8 MG Tb24 tablet  Generic drug:  fesoterodine  Take 8 mg by mouth Daily.     traZODone 50 MG tablet  Commonly known as:  DESYREL  TAKE 0.5 TABLETS (25 MG TOTAL) BY MOUTH AT BEDTIME.        No orders of the defined types were placed in this encounter.    There is no immunization history for the selected administration types on file for this patient.  Social History  Substance Use Topics  . Smoking status: Never Smoker   . Smokeless tobacco: Never Used  . Alcohol Use: No     Comment: last alcohol use 02/27/14    Family history is + DM2, HD  Review of Systems  DATA OBTAINED: from patient, nurse GENERAL:  no fevers, fatigue, appetite changes SKIN: No itching, rash or wounds EYES: No eye pain, redness, discharge EARS: No earache, tinnitus, change in hearing NOSE: No congestion, drainage or bleeding  MOUTH/THROAT: No mouth or tooth pain, No sore throat RESPIRATORY: No cough, wheezing, SOB CARDIAC: No chest pain, palpitations, lower extremity edema  GI: No abdominal pain, No N/V/D or  constipation, No heartburn or reflux  GU: No dysuria, frequency or urgency, or incontinence  MUSCULOSKELETAL: No unrelieved bone/joint pain NEUROLOGIC: No headache, dizziness or focal weakness PSYCHIATRIC: No c/o anxiety  or sadness   Filed Vitals:   12/19/14 0939  BP: 110/80  Pulse: 60  Temp: 97 F (36.1 C)  Resp: 16    SpO2 Readings from Last 1 Encounters:  12/16/14 100%        Physical Exam  GENERAL APPEARANCE: Alert, conversant,  No acute distress.  SKIN: No diaphoresis rash HEAD: Normocephalic, atraumatic  EYES: Conjunctiva/lids clear. Pupils round, reactive. EOMs intact. She has prescription lenses EARS: External exam WNL, canals clear. Hearing grossly normal.  NOSE: No deformity or discharge.  MOUTH/THROAT: Oropharynx is clear mucous membranes moist--she has numerous extractions RESPIRATORY: Breathing is even, unlabored. Lung sounds are clear   CARDIOVASCULAR: Heart RRR no murmurs, rubs or gallops. No peripheral edema--positive pedal pulses no tenderness to palpation of lower extremities.  Steri-Strips in place surgical site right hip there is no sign of infection or drainage bleeding or concerning erythema.   GASTROINTESTINAL: Abdomen is soft, non-tender, not distended w/ normal bowel sounds.   MUSCULOSKELETAL: No abnormal joints or musculature NEUROLOGIC:  Cranial nerves 2-12 grossly intact. Moves all extremities  PSYCHIATRIC:, no behavioral issues  Patient Active Problem List   Diagnosis Date Noted  . Urinary tract infectious disease 12/19/2014  . Status post total replacement of right hip 11/23/2014  . Osteoarthritis of right hip 10/13/2014  . Postoperative anemia due to acute blood loss 08/11/2014  . Leg cramping 01/06/2014  . Other screening mammogram 09/09/2013  . Shingles 11/19/2012  . Miliaria 07/16/2012  . DVT of lower extremity, bilateral (HCC) 04/04/2012  . Routine health maintenance 01/14/2012  . GERD (gastroesophageal reflux disease) 01/09/2012   . Urinary incontinence 12/31/2011  . Chronic kidney disease (CKD), stage III (moderate) 09/19/2010  . Pes planus, congenital 08/08/2010  . Chronic pelvic pain in female 02/03/2010  . Asthma, chronic 09/27/2009  . Elevated alkaline phosphatase level 08/01/2009  . Pain in joint, multiple sites 12/21/2008  . MAJOR DPRSV DISORDER RECURRENT EPISODE MODERATE 09/08/2008  . Gout 07/05/2008  . Hyperlipidemia 05/07/2006  . Coronary atherosclerosis 02/05/2006  . Morbid obesity (HCC) 12/13/2005  . History of alcohol abuse 12/13/2005  . SLEEP APNEA, OBSTRUCTIVE 12/13/2005  . Essential hypertension 12/13/2005  . Pre-diabetes 12/13/2005    CBC    Component Value Date/Time   WBC 10.9* 11/25/2014 0349   RBC 3.57* 11/25/2014 0349   RBC 3.73* 08/20/2008 0555   HGB 10.9* 11/25/2014 0349   HCT 32.6* 11/25/2014 0349   PLT 197 11/25/2014 0349   MCV 91.3 11/25/2014 0349   LYMPHSABS 1.4 10/12/2014 1345   MONOABS 0.3 10/12/2014 1345   EOSABS 0.2 10/12/2014 1345   BASOSABS 0.0 10/12/2014 1345    CMP     Component Value Date/Time   NA 136 11/24/2014 0540   K 4.4 11/24/2014 0540   CL 98* 11/24/2014 0540   CO2 29 11/24/2014 0540   GLUCOSE 123* 11/24/2014 0540   BUN 16 11/24/2014 0540   CREATININE 1.27* 11/24/2014 0540   CREATININE 1.66* 08/11/2014 1200   CALCIUM 8.4* 11/24/2014 0540   PROT 7.4 10/12/2014 1345   ALBUMIN 3.6 10/12/2014 1345   AST 24 10/12/2014 1345   ALT 14 10/12/2014 1345   ALKPHOS 93 10/12/2014 1345   BILITOT 1.1 10/12/2014 1345   GFRNONAA 45* 11/24/2014 0540   GFRNONAA 33* 08/11/2014 1200   GFRAA 52* 11/24/2014 0540   GFRAA 38* 08/11/2014 1200    Lab Results  Component Value Date   HGBA1C 5.5 12/16/2014         Assessment and Plan  #  1-history of right hip replacement-for end-stage osteoarthritis this appears to be stable she will need continued PT and OT for strengthening.--Pain appears to be controlled on Percocet she also has a muscle relaxer  Zanaflex  #2 hypertension this appears controlled on Norvasc and metoprolol as well as Lasix.  #3 history of bilateral lower extremity DVTs she continues on Zaroxolyn this appears to be stable.  #4 history of GERD she is on Pepcid twice a day.  #5 history of postop anemia--hemoglobin was 10.9 on 11/25/2014 will have home health update this notify primary care provider of results.  #6 hyperlipidemia she does continue on Lipitor since her stay here was short was not aggressive pursuing a lipid panel will defer to primary care provider.  #7-depression this did not appear to have been an issue during her stay here she is on Wellbutrin  Again she will need a rolling walker for ambulation status post hip replacement as well as home health support PT and OT.  [id number] note greater than 30 minutes spent preparing this discharge summary-greater than 50% of time spent coordinating plan of care for numerous diagnoses-prescriptions have been written

## 2014-12-13 NOTE — Telephone Encounter (Signed)
PT CALLED AND WANTS TO KNOW IF HER SCRIPT FOR PAIN MEDS IS READY TO BE PICKED UP

## 2014-12-14 DIAGNOSIS — I1310 Hypertensive heart and chronic kidney disease without heart failure, with stage 1 through stage 4 chronic kidney disease, or unspecified chronic kidney disease: Secondary | ICD-10-CM | POA: Diagnosis not present

## 2014-12-14 DIAGNOSIS — Z471 Aftercare following joint replacement surgery: Secondary | ICD-10-CM | POA: Diagnosis not present

## 2014-12-14 DIAGNOSIS — M150 Primary generalized (osteo)arthritis: Secondary | ICD-10-CM | POA: Diagnosis not present

## 2014-12-14 DIAGNOSIS — I2510 Atherosclerotic heart disease of native coronary artery without angina pectoris: Secondary | ICD-10-CM | POA: Diagnosis not present

## 2014-12-14 DIAGNOSIS — J45909 Unspecified asthma, uncomplicated: Secondary | ICD-10-CM | POA: Diagnosis not present

## 2014-12-14 DIAGNOSIS — F330 Major depressive disorder, recurrent, mild: Secondary | ICD-10-CM | POA: Diagnosis not present

## 2014-12-14 DIAGNOSIS — N183 Chronic kidney disease, stage 3 (moderate): Secondary | ICD-10-CM | POA: Diagnosis not present

## 2014-12-14 DIAGNOSIS — I82503 Chronic embolism and thrombosis of unspecified deep veins of lower extremity, bilateral: Secondary | ICD-10-CM | POA: Diagnosis not present

## 2014-12-14 DIAGNOSIS — J449 Chronic obstructive pulmonary disease, unspecified: Secondary | ICD-10-CM | POA: Diagnosis not present

## 2014-12-14 DIAGNOSIS — D500 Iron deficiency anemia secondary to blood loss (chronic): Secondary | ICD-10-CM | POA: Diagnosis not present

## 2014-12-14 DIAGNOSIS — Z96641 Presence of right artificial hip joint: Secondary | ICD-10-CM | POA: Diagnosis not present

## 2014-12-14 DIAGNOSIS — I509 Heart failure, unspecified: Secondary | ICD-10-CM | POA: Diagnosis not present

## 2014-12-14 DIAGNOSIS — D649 Anemia, unspecified: Secondary | ICD-10-CM | POA: Diagnosis not present

## 2014-12-14 DIAGNOSIS — F1010 Alcohol abuse, uncomplicated: Secondary | ICD-10-CM | POA: Diagnosis not present

## 2014-12-14 MED ORDER — OXYCODONE-ACETAMINOPHEN 10-325 MG PO TABS: 1.0000 | ORAL_TABLET | Freq: Three times a day (TID) | ORAL | Status: DC | PRN

## 2014-12-15 DIAGNOSIS — J45909 Unspecified asthma, uncomplicated: Secondary | ICD-10-CM | POA: Diagnosis not present

## 2014-12-15 DIAGNOSIS — I509 Heart failure, unspecified: Secondary | ICD-10-CM | POA: Diagnosis not present

## 2014-12-15 DIAGNOSIS — D500 Iron deficiency anemia secondary to blood loss (chronic): Secondary | ICD-10-CM | POA: Diagnosis not present

## 2014-12-15 DIAGNOSIS — I2510 Atherosclerotic heart disease of native coronary artery without angina pectoris: Secondary | ICD-10-CM | POA: Diagnosis not present

## 2014-12-15 DIAGNOSIS — M150 Primary generalized (osteo)arthritis: Secondary | ICD-10-CM | POA: Diagnosis not present

## 2014-12-15 DIAGNOSIS — N183 Chronic kidney disease, stage 3 (moderate): Secondary | ICD-10-CM | POA: Diagnosis not present

## 2014-12-15 DIAGNOSIS — J449 Chronic obstructive pulmonary disease, unspecified: Secondary | ICD-10-CM | POA: Diagnosis not present

## 2014-12-15 DIAGNOSIS — F330 Major depressive disorder, recurrent, mild: Secondary | ICD-10-CM | POA: Diagnosis not present

## 2014-12-15 DIAGNOSIS — F1010 Alcohol abuse, uncomplicated: Secondary | ICD-10-CM | POA: Diagnosis not present

## 2014-12-15 DIAGNOSIS — I82503 Chronic embolism and thrombosis of unspecified deep veins of lower extremity, bilateral: Secondary | ICD-10-CM | POA: Diagnosis not present

## 2014-12-15 DIAGNOSIS — I1310 Hypertensive heart and chronic kidney disease without heart failure, with stage 1 through stage 4 chronic kidney disease, or unspecified chronic kidney disease: Secondary | ICD-10-CM | POA: Diagnosis not present

## 2014-12-15 DIAGNOSIS — Z471 Aftercare following joint replacement surgery: Secondary | ICD-10-CM | POA: Diagnosis not present

## 2014-12-16 ENCOUNTER — Encounter: Payer: Self-pay | Admitting: Internal Medicine

## 2014-12-16 ENCOUNTER — Ambulatory Visit (INDEPENDENT_AMBULATORY_CARE_PROVIDER_SITE_OTHER): Payer: Medicare Other | Admitting: Internal Medicine

## 2014-12-16 VITALS — BP 143/82 | HR 102 | Temp 98.0°F | Ht 62.0 in | Wt 225.4 lb

## 2014-12-16 DIAGNOSIS — Z471 Aftercare following joint replacement surgery: Secondary | ICD-10-CM | POA: Diagnosis not present

## 2014-12-16 DIAGNOSIS — B9620 Unspecified Escherichia coli [E. coli] as the cause of diseases classified elsewhere: Secondary | ICD-10-CM

## 2014-12-16 DIAGNOSIS — I509 Heart failure, unspecified: Secondary | ICD-10-CM | POA: Diagnosis not present

## 2014-12-16 DIAGNOSIS — M1611 Unilateral primary osteoarthritis, right hip: Secondary | ICD-10-CM

## 2014-12-16 DIAGNOSIS — M150 Primary generalized (osteo)arthritis: Secondary | ICD-10-CM | POA: Diagnosis not present

## 2014-12-16 DIAGNOSIS — R300 Dysuria: Secondary | ICD-10-CM | POA: Diagnosis not present

## 2014-12-16 DIAGNOSIS — I82503 Chronic embolism and thrombosis of unspecified deep veins of lower extremity, bilateral: Secondary | ICD-10-CM | POA: Diagnosis not present

## 2014-12-16 DIAGNOSIS — R7303 Prediabetes: Secondary | ICD-10-CM

## 2014-12-16 DIAGNOSIS — J45909 Unspecified asthma, uncomplicated: Secondary | ICD-10-CM | POA: Diagnosis not present

## 2014-12-16 DIAGNOSIS — N390 Urinary tract infection, site not specified: Secondary | ICD-10-CM | POA: Diagnosis not present

## 2014-12-16 DIAGNOSIS — N183 Chronic kidney disease, stage 3 unspecified: Secondary | ICD-10-CM

## 2014-12-16 DIAGNOSIS — Z96641 Presence of right artificial hip joint: Secondary | ICD-10-CM

## 2014-12-16 DIAGNOSIS — F1010 Alcohol abuse, uncomplicated: Secondary | ICD-10-CM | POA: Diagnosis not present

## 2014-12-16 DIAGNOSIS — Z0000 Encounter for general adult medical examination without abnormal findings: Secondary | ICD-10-CM

## 2014-12-16 DIAGNOSIS — I2510 Atherosclerotic heart disease of native coronary artery without angina pectoris: Secondary | ICD-10-CM | POA: Diagnosis not present

## 2014-12-16 DIAGNOSIS — F330 Major depressive disorder, recurrent, mild: Secondary | ICD-10-CM | POA: Diagnosis not present

## 2014-12-16 DIAGNOSIS — J449 Chronic obstructive pulmonary disease, unspecified: Secondary | ICD-10-CM | POA: Diagnosis not present

## 2014-12-16 DIAGNOSIS — I1310 Hypertensive heart and chronic kidney disease without heart failure, with stage 1 through stage 4 chronic kidney disease, or unspecified chronic kidney disease: Secondary | ICD-10-CM | POA: Diagnosis not present

## 2014-12-16 DIAGNOSIS — D500 Iron deficiency anemia secondary to blood loss (chronic): Secondary | ICD-10-CM | POA: Diagnosis not present

## 2014-12-16 LAB — POCT URINALYSIS DIPSTICK
BILIRUBIN UA: NEGATIVE
GLUCOSE UA: NEGATIVE
KETONES UA: NEGATIVE
Nitrite, UA: NEGATIVE
Protein, UA: NEGATIVE
SPEC GRAV UA: 1.015
Urobilinogen, UA: 0.2
pH, UA: 6

## 2014-12-16 LAB — GLUCOSE, CAPILLARY: Glucose-Capillary: 124 mg/dL — ABNORMAL HIGH (ref 65–99)

## 2014-12-16 LAB — POCT GLYCOSYLATED HEMOGLOBIN (HGB A1C): Hemoglobin A1C: 5.5

## 2014-12-16 MED ORDER — CEPHALEXIN 500 MG PO CAPS: 500.0000 mg | ORAL_CAPSULE | Freq: Two times a day (BID) | ORAL | Status: DC

## 2014-12-16 NOTE — Patient Instructions (Addendum)
[name] it was nice meeting you today.  -Take Cephalexin 500 mg: 1 capsule twice daily for 3 days.  -I have ordered a urine test today and will call you when the result comes back.

## 2014-12-17 DIAGNOSIS — J45909 Unspecified asthma, uncomplicated: Secondary | ICD-10-CM | POA: Diagnosis not present

## 2014-12-17 DIAGNOSIS — F330 Major depressive disorder, recurrent, mild: Secondary | ICD-10-CM | POA: Diagnosis not present

## 2014-12-17 DIAGNOSIS — D500 Iron deficiency anemia secondary to blood loss (chronic): Secondary | ICD-10-CM | POA: Diagnosis not present

## 2014-12-17 DIAGNOSIS — I509 Heart failure, unspecified: Secondary | ICD-10-CM | POA: Diagnosis not present

## 2014-12-17 DIAGNOSIS — N183 Chronic kidney disease, stage 3 (moderate): Secondary | ICD-10-CM | POA: Diagnosis not present

## 2014-12-17 DIAGNOSIS — F1010 Alcohol abuse, uncomplicated: Secondary | ICD-10-CM | POA: Diagnosis not present

## 2014-12-17 DIAGNOSIS — M150 Primary generalized (osteo)arthritis: Secondary | ICD-10-CM | POA: Diagnosis not present

## 2014-12-17 DIAGNOSIS — I1310 Hypertensive heart and chronic kidney disease without heart failure, with stage 1 through stage 4 chronic kidney disease, or unspecified chronic kidney disease: Secondary | ICD-10-CM | POA: Diagnosis not present

## 2014-12-17 DIAGNOSIS — I2510 Atherosclerotic heart disease of native coronary artery without angina pectoris: Secondary | ICD-10-CM | POA: Diagnosis not present

## 2014-12-17 DIAGNOSIS — J449 Chronic obstructive pulmonary disease, unspecified: Secondary | ICD-10-CM | POA: Diagnosis not present

## 2014-12-17 DIAGNOSIS — I82503 Chronic embolism and thrombosis of unspecified deep veins of lower extremity, bilateral: Secondary | ICD-10-CM | POA: Diagnosis not present

## 2014-12-17 DIAGNOSIS — Z471 Aftercare following joint replacement surgery: Secondary | ICD-10-CM | POA: Diagnosis not present

## 2014-12-18 LAB — URINE CULTURE

## 2014-12-19 ENCOUNTER — Encounter: Payer: Self-pay | Admitting: Internal Medicine

## 2014-12-19 DIAGNOSIS — N390 Urinary tract infection, site not specified: Secondary | ICD-10-CM | POA: Insufficient documentation

## 2014-12-19 MED ORDER — CEPHALEXIN 500 MG PO CAPS: 500.0000 mg | ORAL_CAPSULE | Freq: Two times a day (BID) | ORAL | Status: AC

## 2014-12-19 NOTE — Assessment & Plan Note (Addendum)
Patient has +1 pitting edema of bilateral lower extremities. GFR 52 and SCr 1.27 in 10/2014. Patient has a history of DVT and tachycardic during this visit with pulse 102. However, she is currently on Xarelto and not complaining of any lower extremity pain, chest pain, or shortness of breath. She had total hip replacement surgery last month and lower extremity edema is likely due to decreased mobility. Patient is not completely immobile as PT comes to her house 3 times per week.  -continue lasix 40 mg daily  -reassess at next visit

## 2014-12-19 NOTE — Assessment & Plan Note (Signed)
Patient declined influenza vaccine.

## 2014-12-19 NOTE — Assessment & Plan Note (Addendum)
1 week history of dysuria, urinary frequency and urgency. Patient currently takes Toviaz for stress incontinence but reports still getting up 7-8 times per night to urinate. Denies any fevers, chills, nausea, vomiting, or abdominal/ suprapubic pain. Denies having any hematuria or flank pain. States she is not currently sexually active. No CVA tenderness and patient is afebrile. Urine dipstick showing large amount of leukocytes and negative nitrites. Patient was sent home with a 3 day supply of Cephalexin 500 mg BID. Urine culture later came back positive for E.coli. Renette Butters-Sen prescription for another 4 days of Cephalexin 500 mg BID into the pharmacy. I called the patient and informed her that she will be taking the antibiotic for a total of 7 days and she expressed her understanding. In addition, patient has been advised to call me at the clinic if her symptoms do not resolve after finishing the course of her antibiotic.

## 2014-12-19 NOTE — Progress Notes (Signed)
Patient ID: Vanessa Palmer, female   DOB: [birth year], 64 y.o.   MRN: [id number]   Subjective:   Patient ID: Vanessa Palmer female   DOB: June 20, 1950 64 y.o.   MRN: [id number]  HPI: [name] is a 64 y.o. F with a PMHx of conditions listed below presenting to the clinic with a 1 week history of dysuria, urinary frequency and urgency. Patient currently takes Toviaz for stress incontinence but reports still getting up 7-8 times per night to urinate. Denies any fevers, chills, nausea, vomiting, or abdominal/ suprapubic pain. Denies having any hematuria or flank pain. States she is not currently sexually active.     Past Medical History  Diagnosis Date  . Hyperlipidemia   . Hypertension   . Gout   . Obesity   . Depression   . DJD (degenerative joint disease)   . OSA (obstructive sleep apnea)     previously used CPAP, has lost  . CAD (coronary artery disease)     s/p non-Q wave MI in 06/2001 and 2004, 2005  . ALCOHOL ABUSE 12/13/2005    Annotation: Sober since 11/06 Qualifier: Diagnosis of  By: Wallace Cullens MD, Natalia Leatherwood    . INTRINSIC ASTHMA, WITH EXACERBATION 09/27/2009    Qualifier: Diagnosis of  By: Denton Meek MD, Tillie Rung    . Chronic kidney disease (CKD), stage III (moderate) 09/19/2010  . CHF (congestive heart failure) (HCC)   . DVT of lower extremity, bilateral (HCC) 04/04/2012    On Xarelto    . GERD (gastroesophageal reflux disease)   . Seizures (HCC)     As a teenager   . Headache     migraines  . Anemia    Current Outpatient Prescriptions  Medication Sig Dispense Refill  . acetaminophen (TYLENOL) 500 MG tablet Take 1,000 mg by mouth every 6 (six) hours as needed for moderate pain.    Marland Kitchen amLODipine (NORVASC) 10 MG tablet TAKE 1 TABLET (10 MG TOTAL) BY MOUTH DAILY. 30 tablet 3  . atorvastatin (LIPITOR) 40 MG tablet TAKE 1 TABLET (40 MG TOTAL) BY MOUTH DAILY. 30 tablet 3  . b complex vitamins tablet Take 1 tablet by mouth daily. 30 tablet 6  . buPROPion (WELLBUTRIN XL) 150 MG 24  hr tablet Take 1 tablet (150 mg total) by mouth every morning. 90 tablet 3  . cephALEXin (KEFLEX) 500 MG capsule Take 1 capsule (500 mg total) by mouth 2 (two) times daily. 6 capsule 0  . famotidine (PEPCID) 20 MG tablet Take 1 tablet (20 mg total) by mouth 2 (two) times daily. 90 tablet 2  . fluticasone (FLONASE) 50 MCG/ACT nasal spray INSTILL TWO   SPRAYS IN EACH NOSTRIL ONCE DAILY 16 g 3  . fluticasone (FLOVENT HFA) 44 MCG/ACT inhaler Inhale 2 puffs into the lungs 2 (two) times daily. 1 Inhaler 12  . furosemide (LASIX) 20 MG tablet Take 2 tablets (40 mg total) by mouth daily. 60 tablet 3  . metoprolol (LOPRESSOR) 50 MG tablet Take 1 tablet (50 mg total) by mouth daily. 90 tablet 1  . nitroGLYCERIN (NITROSTAT) 0.4 MG SL tablet Place 0.4 mg under the tongue every 5 (five) minutes as needed for chest pain.    Marland Kitchen oxyCODONE-acetaminophen (PERCOCET) 10-325 MG tablet Take 1 tablet by mouth every 8 (eight) hours as needed for pain. 90 tablet 0  . predniSONE (DELTASONE) 50 MG tablet Take 50 mg by mouth daily with breakfast.    . ranitidine (ZANTAC) 150 MG tablet Take 1 tablet (150 mg  total) by mouth 2 (two) times daily. (Patient not taking: Reported on 11/11/2014) 14 tablet 0  . rivaroxaban (XARELTO) 20 MG TABS tablet TAKE 1 TABLET (20 MG TOTAL) BY MOUTH DAILY WITH SUPPER. (Patient taking differently: Take 20 mg by mouth daily. ) 60 tablet 3  . tiZANidine (ZANAFLEX) 4 MG tablet Take 1 tablet (4 mg total) by mouth every 6 (six) hours as needed for muscle spasms. 30 tablet 0  . TOVIAZ 8 MG TB24 Take 8 mg by mouth Daily.    . traZODone (DESYREL) 50 MG tablet TAKE 0.5 TABLETS (25 MG TOTAL) BY MOUTH AT BEDTIME. 45 tablet 3  . [DISCONTINUED] losartan (COZAAR) 100 MG tablet TAKE 1 TABLET (100 MG TOTAL) BY MOUTH TWO   TIMES DAILY. (Patient taking differently: TAKE 1 TABLET (100 MG TOTAL) BY MOUTH DAILY) 60 tablet 3   No current facility-administered medications for this visit.   Family History  Problem Relation  Age of Onset  . Mental illness Mother   . Heart disease Father   . Diabetes Sister   . Diabetes Sister   . Diabetes Sister    Social History   Social History  . Marital Status: Single    Spouse Name: N/A  . Number of Children: 3  . Years of Education: GED   Occupational History  . retired     previously worked as a Hospital doctorroom intendent   Social History Main Topics  . Smoking status: Never Smoker   . Smokeless tobacco: Never Used  . Alcohol Use: No     Comment: last alcohol use 02/27/14  . Drug Use: No  . Sexual Activity: Not Asked   Other Topics Concern  . None   Social History Narrative   [place] in [place]         Review of Systems: Review of Systems  Constitutional: Negative for fever and chills.  HENT: Negative for ear pain.   Eyes: Negative for blurred vision and pain.  Respiratory: Negative for cough, shortness of breath and wheezing.   Cardiovascular: Negative for chest pain and leg swelling.  Gastrointestinal: Negative for nausea, vomiting and abdominal pain.  Genitourinary: Positive for dysuria, urgency and frequency. Negative for hematuria and flank pain.  Musculoskeletal: Negative for myalgias.  Skin: Negative for itching and rash.  Neurological: Negative for dizziness, sensory change, focal weakness and headaches.   Objective:  Physical Exam: Filed Vitals:   12/16/14 1445  BP: 143/82  Pulse: 102  Temp: 98 F (36.7 C)  TempSrc: Oral  Height: 5\' 2"  (1.575 m)  Weight: 225 lb 6.4 oz (102.241 kg)  SpO2: 100%   Physical Exam  Constitutional: She is oriented to person, place, and time. She appears well-developed and well-nourished. No distress.  HENT:  Head: Normocephalic and atraumatic.  Eyes: EOM are normal. Pupils are equal, round, and reactive to light.  Neck: Neck supple. No tracheal deviation present.  Cardiovascular: Normal rate, regular rhythm and intact distal pulses.   Pulmonary/Chest: Effort normal. No respiratory distress. She  has no wheezes. She has no rales.  Abdominal: Soft. Bowel sounds are normal. She exhibits no distension. There is no tenderness.  Musculoskeletal: She exhibits edema.  +1 pitting edema of bilateral LEs NO CVA tenderness bilaterally   Neurological: She is alert and oriented to person, place, and time.  Skin: Skin is warm and dry.   Assessment & Plan:

## 2014-12-19 NOTE — Assessment & Plan Note (Signed)
Patient had her right total hip replacement surgery last month. States her pain is better controlled now and PT comes to her house 3 times per week to help improve her function.

## 2014-12-19 NOTE — Assessment & Plan Note (Signed)
A1c 5.5 and CBG 124 at this visit. A1c improved from 5.7 eleven months ago. -Educated patient about diet and weight loss.

## 2014-12-22 DIAGNOSIS — M150 Primary generalized (osteo)arthritis: Secondary | ICD-10-CM | POA: Diagnosis not present

## 2014-12-22 DIAGNOSIS — F1010 Alcohol abuse, uncomplicated: Secondary | ICD-10-CM | POA: Diagnosis not present

## 2014-12-22 DIAGNOSIS — I2510 Atherosclerotic heart disease of native coronary artery without angina pectoris: Secondary | ICD-10-CM | POA: Diagnosis not present

## 2014-12-22 DIAGNOSIS — N183 Chronic kidney disease, stage 3 (moderate): Secondary | ICD-10-CM | POA: Diagnosis not present

## 2014-12-22 DIAGNOSIS — I1310 Hypertensive heart and chronic kidney disease without heart failure, with stage 1 through stage 4 chronic kidney disease, or unspecified chronic kidney disease: Secondary | ICD-10-CM | POA: Diagnosis not present

## 2014-12-22 DIAGNOSIS — J45909 Unspecified asthma, uncomplicated: Secondary | ICD-10-CM | POA: Diagnosis not present

## 2014-12-22 DIAGNOSIS — J449 Chronic obstructive pulmonary disease, unspecified: Secondary | ICD-10-CM | POA: Diagnosis not present

## 2014-12-22 DIAGNOSIS — Z471 Aftercare following joint replacement surgery: Secondary | ICD-10-CM | POA: Diagnosis not present

## 2014-12-22 DIAGNOSIS — D500 Iron deficiency anemia secondary to blood loss (chronic): Secondary | ICD-10-CM | POA: Diagnosis not present

## 2014-12-22 DIAGNOSIS — I509 Heart failure, unspecified: Secondary | ICD-10-CM | POA: Diagnosis not present

## 2014-12-22 DIAGNOSIS — F330 Major depressive disorder, recurrent, mild: Secondary | ICD-10-CM | POA: Diagnosis not present

## 2014-12-22 DIAGNOSIS — I82503 Chronic embolism and thrombosis of unspecified deep veins of lower extremity, bilateral: Secondary | ICD-10-CM | POA: Diagnosis not present

## 2014-12-23 DIAGNOSIS — M1990 Unspecified osteoarthritis, unspecified site: Secondary | ICD-10-CM | POA: Diagnosis not present

## 2014-12-23 DIAGNOSIS — J45901 Unspecified asthma with (acute) exacerbation: Secondary | ICD-10-CM | POA: Diagnosis not present

## 2014-12-23 DIAGNOSIS — G4733 Obstructive sleep apnea (adult) (pediatric): Secondary | ICD-10-CM | POA: Diagnosis not present

## 2014-12-23 DIAGNOSIS — R32 Unspecified urinary incontinence: Secondary | ICD-10-CM | POA: Diagnosis not present

## 2014-12-23 DIAGNOSIS — R609 Edema, unspecified: Secondary | ICD-10-CM | POA: Diagnosis not present

## 2014-12-24 DIAGNOSIS — J449 Chronic obstructive pulmonary disease, unspecified: Secondary | ICD-10-CM | POA: Diagnosis not present

## 2014-12-24 DIAGNOSIS — I82503 Chronic embolism and thrombosis of unspecified deep veins of lower extremity, bilateral: Secondary | ICD-10-CM | POA: Diagnosis not present

## 2014-12-24 DIAGNOSIS — F1010 Alcohol abuse, uncomplicated: Secondary | ICD-10-CM | POA: Diagnosis not present

## 2014-12-24 DIAGNOSIS — N183 Chronic kidney disease, stage 3 (moderate): Secondary | ICD-10-CM | POA: Diagnosis not present

## 2014-12-24 DIAGNOSIS — D500 Iron deficiency anemia secondary to blood loss (chronic): Secondary | ICD-10-CM | POA: Diagnosis not present

## 2014-12-24 DIAGNOSIS — J45909 Unspecified asthma, uncomplicated: Secondary | ICD-10-CM | POA: Diagnosis not present

## 2014-12-24 DIAGNOSIS — M150 Primary generalized (osteo)arthritis: Secondary | ICD-10-CM | POA: Diagnosis not present

## 2014-12-24 DIAGNOSIS — Z471 Aftercare following joint replacement surgery: Secondary | ICD-10-CM | POA: Diagnosis not present

## 2014-12-24 DIAGNOSIS — F330 Major depressive disorder, recurrent, mild: Secondary | ICD-10-CM | POA: Diagnosis not present

## 2014-12-24 DIAGNOSIS — I2510 Atherosclerotic heart disease of native coronary artery without angina pectoris: Secondary | ICD-10-CM | POA: Diagnosis not present

## 2014-12-24 DIAGNOSIS — I1310 Hypertensive heart and chronic kidney disease without heart failure, with stage 1 through stage 4 chronic kidney disease, or unspecified chronic kidney disease: Secondary | ICD-10-CM | POA: Diagnosis not present

## 2014-12-24 DIAGNOSIS — I509 Heart failure, unspecified: Secondary | ICD-10-CM | POA: Diagnosis not present

## 2014-12-24 NOTE — Progress Notes (Signed)
Internal Medicine Clinic Attending  I saw and evaluated the patient.  I personally confirmed the key portions of the history and exam documented by Dr. Rathore and I reviewed pertinent patient test results.  The assessment, diagnosis, and plan were formulated together and I agree with the documentation in the resident's note.  

## 2014-12-28 DIAGNOSIS — F1920 Other psychoactive substance dependence, uncomplicated: Secondary | ICD-10-CM | POA: Diagnosis not present

## 2014-12-28 DIAGNOSIS — Z5181 Encounter for therapeutic drug level monitoring: Secondary | ICD-10-CM | POA: Diagnosis not present

## 2014-12-29 ENCOUNTER — Encounter: Payer: Self-pay | Admitting: Internal Medicine

## 2014-12-29 ENCOUNTER — Ambulatory Visit (INDEPENDENT_AMBULATORY_CARE_PROVIDER_SITE_OTHER): Payer: Medicare Other | Admitting: Internal Medicine

## 2014-12-29 VITALS — BP 99/67 | HR 64 | Temp 98.2°F | Ht 62.0 in | Wt 229.2 lb

## 2014-12-29 DIAGNOSIS — R252 Cramp and spasm: Secondary | ICD-10-CM

## 2014-12-29 DIAGNOSIS — B9689 Other specified bacterial agents as the cause of diseases classified elsewhere: Secondary | ICD-10-CM

## 2014-12-29 DIAGNOSIS — I10 Essential (primary) hypertension: Secondary | ICD-10-CM | POA: Diagnosis not present

## 2014-12-29 DIAGNOSIS — D62 Acute posthemorrhagic anemia: Secondary | ICD-10-CM | POA: Diagnosis not present

## 2014-12-29 DIAGNOSIS — R7303 Prediabetes: Secondary | ICD-10-CM

## 2014-12-29 DIAGNOSIS — Z6841 Body Mass Index (BMI) 40.0 and over, adult: Secondary | ICD-10-CM | POA: Diagnosis not present

## 2014-12-29 DIAGNOSIS — Z8619 Personal history of other infectious and parasitic diseases: Secondary | ICD-10-CM

## 2014-12-29 DIAGNOSIS — N390 Urinary tract infection, site not specified: Secondary | ICD-10-CM | POA: Diagnosis not present

## 2014-12-29 DIAGNOSIS — T783XXD Angioneurotic edema, subsequent encounter: Secondary | ICD-10-CM

## 2014-12-29 DIAGNOSIS — M2550 Pain in unspecified joint: Secondary | ICD-10-CM

## 2014-12-29 DIAGNOSIS — M1611 Unilateral primary osteoarthritis, right hip: Secondary | ICD-10-CM

## 2014-12-29 NOTE — Assessment & Plan Note (Addendum)
Repeat A1C 5.5, will resolve.  She is not receiving any treatment.  She does have obesity and will do well with diet and exercise.  Nutrition consult placed.

## 2014-12-29 NOTE — Patient Instructions (Signed)
General Instructions: Please schedule a follow up visit within the next 2 weeks, please overbook [facility] for 9/16.     For your medications:   Please bring all of your pill  Bottles with you to each visit.  This will help make sure that we have an up to date list of all the medications you are taking.  Please also bring any over the counter herbal medications you are taking (not including advil, tylenol, etc.)  Please STOP taking amlodipine and make an appointment with me for 9/16 in the morning.   You will have an appointment with our nutritionist Ms. Lupita Leashonna Plyler, please keep this appointment to discuss weight loss and nutrition.     Thank you!    Treatment Goals:  Goals (1 Years of Data) as of 12/29/14          As of Today 12/19/14 12/16/14 12/01/14 11/26/14     Blood Pressure   . Blood Pressure < 140/90  99/67 110/80 143/82 120/83 137/67      Progress Toward Treatment Goals:  Treatment Goal 08/11/2014  Blood pressure at goal  Prevent falls at goal    Self Care Goals & Plans:  Self Care Goal 05/27/2014  Manage my medications take my medicines as prescribed; bring my medications to every visit; refill my medications on time  Monitor my health -  Eat healthy foods eat more vegetables; eat foods that are low in salt; eat baked foods instead of fried foods  Be physically active -  Prevent falls -  Meeting treatment goals -    No flowsheet data found.   Care Management & Community Referrals:  Referral 06/02/2013  Referrals made for care management support none needed  Referrals made to community resources none

## 2014-12-29 NOTE — Progress Notes (Signed)
   Subjective:    Patient ID: Vanessa Palmer, female    DOB: January 14, 1951, 64 y.o.   MRN: [id number]   CC: Follow up post hip surgery  HPI  Vanessa Palmer is a 64yo woman with PMH of asthma, CKD, HTN, GERD, Osteoarthritis and recent surgery on right hip.  She has also been recently diagnosed with a UTI and was treated with cephalexin.  She has also recently been in the ED due to angioedema thought to be due to her ARB - this has been stopped.  There is no further concern for swelling and she has stopped taking her losartan.  She does not have any wheezing or difficulty speaking or swallowing.      Urinary symptoms - not having as much pain when she pees, but the pain is still there, nocturia is still at about 10-11 times per night, she has a bedside commode now which helps.  She reports that she did not get the continued cephalexin as per the plan noted by the previous physician.  She takes Toviaz for urinary incontinence and takes it in the morning.  She would like to move her Toviaz to the night to see if this helps.  She is not due to see her Urologist for while.    She has occasional leg cramping for which she takes Tizandine and this helps.   She does not know which medications she takes today.  Apparently, she has had deaths of both her boyfriend and her ex-husband since last being seen.  She is very subdued today, but reports that she has very good support at home and is doing okay.   She recently had surgery on her left hip and her pain in the hip is improved.  She has a well healing scar on her anterior hip.  She continues to have some arthritis pain, but she is walking with a cane today, which is an improvement.   She cannot remember what medications she takes and she did not bring them in.    Review of Systems  Constitutional: Positive for diaphoresis (at night). Negative for fever and chills.  HENT: Negative for congestion and dental problem.   Eyes: Negative for photophobia and visual  disturbance.  Respiratory: Negative for cough and shortness of breath.   Cardiovascular: Negative for chest pain.  Genitourinary: Positive for dysuria, urgency, frequency and enuresis. Negative for hematuria, flank pain and difficulty urinating.  Musculoskeletal: Positive for back pain, arthralgias and gait problem (use cane, chronic).  Neurological: Positive for dizziness (This morning). Negative for seizures and weakness.       Objective:   Physical Exam  Constitutional: She is oriented to person, place, and time. She appears well-developed and well-nourished. No distress.  Walking with cane, which is much improved from last visit  HENT:  Head: Normocephalic and atraumatic.  Eyes: Conjunctivae are normal. No scleral icterus.  Wearing glasses  Cardiovascular: Normal rate, regular rhythm and normal heart sounds.   No murmur heard. Pulmonary/Chest: Effort normal and breath sounds normal. No respiratory distress.  Genitourinary:  No CVA tenderness  Musculoskeletal: She exhibits edema (1+ to calves). She exhibits no tenderness.  + well healed scar right anterior hip  Neurological: She is alert and oriented to person, place, and time.  Psychiatric: She has a normal mood and affect. Her behavior is normal.   UA, CBC, BMET today     Assessment & Plan:  RTC in 2 weeks with Dr. Criselda PeachesMullen

## 2014-12-30 DIAGNOSIS — F1010 Alcohol abuse, uncomplicated: Secondary | ICD-10-CM | POA: Diagnosis not present

## 2014-12-30 DIAGNOSIS — M150 Primary generalized (osteo)arthritis: Secondary | ICD-10-CM | POA: Diagnosis not present

## 2014-12-30 DIAGNOSIS — J449 Chronic obstructive pulmonary disease, unspecified: Secondary | ICD-10-CM | POA: Diagnosis not present

## 2014-12-30 DIAGNOSIS — D500 Iron deficiency anemia secondary to blood loss (chronic): Secondary | ICD-10-CM | POA: Diagnosis not present

## 2014-12-30 DIAGNOSIS — Z471 Aftercare following joint replacement surgery: Secondary | ICD-10-CM | POA: Diagnosis not present

## 2014-12-30 DIAGNOSIS — N183 Chronic kidney disease, stage 3 (moderate): Secondary | ICD-10-CM | POA: Diagnosis not present

## 2014-12-30 DIAGNOSIS — J45909 Unspecified asthma, uncomplicated: Secondary | ICD-10-CM | POA: Diagnosis not present

## 2014-12-30 DIAGNOSIS — I82503 Chronic embolism and thrombosis of unspecified deep veins of lower extremity, bilateral: Secondary | ICD-10-CM | POA: Diagnosis not present

## 2014-12-30 DIAGNOSIS — I509 Heart failure, unspecified: Secondary | ICD-10-CM | POA: Diagnosis not present

## 2014-12-30 DIAGNOSIS — F330 Major depressive disorder, recurrent, mild: Secondary | ICD-10-CM | POA: Diagnosis not present

## 2014-12-30 DIAGNOSIS — T783XXA Angioneurotic edema, initial encounter: Secondary | ICD-10-CM | POA: Insufficient documentation

## 2014-12-30 DIAGNOSIS — I2510 Atherosclerotic heart disease of native coronary artery without angina pectoris: Secondary | ICD-10-CM | POA: Diagnosis not present

## 2014-12-30 DIAGNOSIS — I1310 Hypertensive heart and chronic kidney disease without heart failure, with stage 1 through stage 4 chronic kidney disease, or unspecified chronic kidney disease: Secondary | ICD-10-CM | POA: Diagnosis not present

## 2014-12-30 LAB — URINALYSIS, COMPLETE
BILIRUBIN UA: NEGATIVE
Glucose, UA: NEGATIVE
Ketones, UA: NEGATIVE
LEUKOCYTES UA: NEGATIVE
Nitrite, UA: POSITIVE — AB
PH UA: 6.5 (ref 5.0–7.5)
PROTEIN UA: NEGATIVE
RBC UA: NEGATIVE
SPEC GRAV UA: 1.011 (ref 1.005–1.030)
Urobilinogen, Ur: 0.2 mg/dL (ref 0.2–1.0)

## 2014-12-30 LAB — BMP8+ANION GAP
ANION GAP: 16 mmol/L (ref 10.0–18.0)
BUN/Creatinine Ratio: 14 (ref 11–26)
BUN: 21 mg/dL (ref 8–27)
CALCIUM: 8.7 mg/dL (ref 8.7–10.3)
CO2: 25 mmol/L (ref 18–29)
CREATININE: 1.5 mg/dL — AB (ref 0.57–1.00)
Chloride: 99 mmol/L (ref 97–106)
GFR, EST AFRICAN AMERICAN: 43 mL/min/{1.73_m2} — AB (ref >59)
GFR, EST NON AFRICAN AMERICAN: 37 mL/min/{1.73_m2} — AB (ref >59)
Glucose: 106 mg/dL — ABNORMAL HIGH (ref 65–99)
POTASSIUM: 3.8 mmol/L (ref 3.5–5.2)
Sodium: 140 mmol/L (ref 136–144)

## 2014-12-30 LAB — MICROSCOPIC EXAMINATION: CASTS: NONE SEEN /LPF

## 2014-12-30 LAB — CBC WITH DIFFERENTIAL/PLATELET
BASOS: 0 %
Basophils Absolute: 0 10*3/uL (ref 0.0–0.2)
EOS (ABSOLUTE): 0.1 10*3/uL (ref 0.0–0.4)
EOS: 1 %
HEMATOCRIT: 35 % (ref 34.0–46.6)
HEMOGLOBIN: 11.5 g/dL (ref 11.1–15.9)
IMMATURE GRANS (ABS): 0 10*3/uL (ref 0.0–0.1)
IMMATURE GRANULOCYTES: 1 %
LYMPHS: 16 %
Lymphocytes Absolute: 1.2 10*3/uL (ref 0.7–3.1)
MCH: 31 pg (ref 26.6–33.0)
MCHC: 32.9 g/dL (ref 31.5–35.7)
MCV: 94 fL (ref 79–97)
MONOCYTES: 7 %
Monocytes Absolute: 0.6 10*3/uL (ref 0.1–0.9)
NEUTROS ABS: 5.6 10*3/uL (ref 1.4–7.0)
NEUTROS PCT: 75 %
PLATELETS: 241 10*3/uL (ref 150–379)
RBC: 3.71 x10E6/uL — ABNORMAL LOW (ref 3.77–5.28)
RDW: 15.7 % — ABNORMAL HIGH (ref 12.3–15.4)
WBC: 7.5 10*3/uL (ref 3.4–10.8)

## 2014-12-30 MED ORDER — CEPHALEXIN 250 MG PO CAPS: 250.0000 mg | ORAL_CAPSULE | Freq: Four times a day (QID) | ORAL | Status: AC

## 2014-12-30 NOTE — Assessment & Plan Note (Signed)
BP Readings from Last 3 Encounters:  12/29/14 99/67  12/16/14 143/82  12/19/14 110/80    Lab Results  Component Value Date   NA 140 12/29/2014   K 3.8 12/29/2014   CREATININE 1.50* 12/29/2014    Assessment: Blood pressure control: controlled Progress toward BP goal:  unable to assess Comments: BP low today and Cr elevated  Plan: Medications:  STOP amlodipine, continue metoprolol for now Educational resources provided:   Self management tools provided:   Other plans: Renal function worsened as noted on BMET.  Possibly related to low blood pressure and poor perfusion.  UA also appears to show persistent urinary infection.   Will plan to stop amlodipine, treat urinary infection and see back in 2 weeks for reassessment.  She is to call for any fevers, chills, headaches or concerning change in health.

## 2014-12-30 NOTE — Assessment & Plan Note (Signed)
Recently seen in ED for this issue, completely resolved at this time.  She is not taking her ARB.  Will place this medication on allergy list.

## 2014-12-30 NOTE — Assessment & Plan Note (Signed)
Unclear if she is taking B complex vitamins as previously recommended, she cannot remember any of the medications she is taking.  She does have an Rx for a muscle relaxer and reports taking this as needed.  Will discuss further at next visit.

## 2014-12-30 NOTE — Assessment & Plan Note (Addendum)
She is doing very well after hip surgery.  She has PT in the home 3 X per week.  She was given a different pain medication by her surgeon after her surgery which she notes has not been helping her.  She requests to go back on her Percocet which she was getting from our clinic.  I provided her with a pre-printed Rx which had been done in Mid-October which she had not yet picked up.  I reviewed the [place] narcotic database.  We discussed not filling Rx for narcotics from other providers after this one.  Will re-review at next visit.  Her pain medication does help her to be more active, improves her ability to walk and interact with physical therapy.  I think she will continue to need the pain medication to engage in meaningful exercise and lose weight.

## 2014-12-30 NOTE — Assessment & Plan Note (Signed)
BMI 41.9.  We discussed her weight and her risk for diabetes, heart disease, etc.  We discussed some nutritional issues and I advised referral to our in clinic nutritionist which she has agreed to.  Referral placed.

## 2014-12-30 NOTE — Assessment & Plan Note (Signed)
Repeat UA shows persistent nitrites in urine, moderate bacteria, few white cells.  She had a previous urine culture with pansensitive E.coli.  Since she did not complete therapy previously, will prescribed cephalexin 250mg  q6 hours X 7 days and see her back in 2 weeks as noted above.

## 2014-12-30 NOTE — Assessment & Plan Note (Signed)
Recheck CBC today. 

## 2014-12-31 DIAGNOSIS — Z5181 Encounter for therapeutic drug level monitoring: Secondary | ICD-10-CM | POA: Diagnosis not present

## 2014-12-31 DIAGNOSIS — F1010 Alcohol abuse, uncomplicated: Secondary | ICD-10-CM | POA: Diagnosis not present

## 2015-01-05 DIAGNOSIS — M150 Primary generalized (osteo)arthritis: Secondary | ICD-10-CM | POA: Diagnosis not present

## 2015-01-05 DIAGNOSIS — N183 Chronic kidney disease, stage 3 (moderate): Secondary | ICD-10-CM | POA: Diagnosis not present

## 2015-01-05 DIAGNOSIS — F1010 Alcohol abuse, uncomplicated: Secondary | ICD-10-CM | POA: Diagnosis not present

## 2015-01-05 DIAGNOSIS — J449 Chronic obstructive pulmonary disease, unspecified: Secondary | ICD-10-CM | POA: Diagnosis not present

## 2015-01-05 DIAGNOSIS — Z471 Aftercare following joint replacement surgery: Secondary | ICD-10-CM | POA: Diagnosis not present

## 2015-01-05 DIAGNOSIS — I82503 Chronic embolism and thrombosis of unspecified deep veins of lower extremity, bilateral: Secondary | ICD-10-CM | POA: Diagnosis not present

## 2015-01-05 DIAGNOSIS — F330 Major depressive disorder, recurrent, mild: Secondary | ICD-10-CM | POA: Diagnosis not present

## 2015-01-05 DIAGNOSIS — I1310 Hypertensive heart and chronic kidney disease without heart failure, with stage 1 through stage 4 chronic kidney disease, or unspecified chronic kidney disease: Secondary | ICD-10-CM | POA: Diagnosis not present

## 2015-01-05 DIAGNOSIS — I509 Heart failure, unspecified: Secondary | ICD-10-CM | POA: Diagnosis not present

## 2015-01-05 DIAGNOSIS — D500 Iron deficiency anemia secondary to blood loss (chronic): Secondary | ICD-10-CM | POA: Diagnosis not present

## 2015-01-05 DIAGNOSIS — J45909 Unspecified asthma, uncomplicated: Secondary | ICD-10-CM | POA: Diagnosis not present

## 2015-01-05 DIAGNOSIS — I2510 Atherosclerotic heart disease of native coronary artery without angina pectoris: Secondary | ICD-10-CM | POA: Diagnosis not present

## 2015-01-10 DIAGNOSIS — J449 Chronic obstructive pulmonary disease, unspecified: Secondary | ICD-10-CM | POA: Diagnosis not present

## 2015-01-10 DIAGNOSIS — M150 Primary generalized (osteo)arthritis: Secondary | ICD-10-CM | POA: Diagnosis not present

## 2015-01-10 DIAGNOSIS — I509 Heart failure, unspecified: Secondary | ICD-10-CM | POA: Diagnosis not present

## 2015-01-10 DIAGNOSIS — N183 Chronic kidney disease, stage 3 (moderate): Secondary | ICD-10-CM | POA: Diagnosis not present

## 2015-01-10 DIAGNOSIS — I82503 Chronic embolism and thrombosis of unspecified deep veins of lower extremity, bilateral: Secondary | ICD-10-CM | POA: Diagnosis not present

## 2015-01-10 DIAGNOSIS — J45901 Unspecified asthma with (acute) exacerbation: Secondary | ICD-10-CM | POA: Diagnosis not present

## 2015-01-10 DIAGNOSIS — R609 Edema, unspecified: Secondary | ICD-10-CM | POA: Diagnosis not present

## 2015-01-10 DIAGNOSIS — Z471 Aftercare following joint replacement surgery: Secondary | ICD-10-CM | POA: Diagnosis not present

## 2015-01-10 DIAGNOSIS — I1310 Hypertensive heart and chronic kidney disease without heart failure, with stage 1 through stage 4 chronic kidney disease, or unspecified chronic kidney disease: Secondary | ICD-10-CM | POA: Diagnosis not present

## 2015-01-10 DIAGNOSIS — F330 Major depressive disorder, recurrent, mild: Secondary | ICD-10-CM | POA: Diagnosis not present

## 2015-01-10 DIAGNOSIS — I2510 Atherosclerotic heart disease of native coronary artery without angina pectoris: Secondary | ICD-10-CM | POA: Diagnosis not present

## 2015-01-10 DIAGNOSIS — J45909 Unspecified asthma, uncomplicated: Secondary | ICD-10-CM | POA: Diagnosis not present

## 2015-01-10 DIAGNOSIS — D500 Iron deficiency anemia secondary to blood loss (chronic): Secondary | ICD-10-CM | POA: Diagnosis not present

## 2015-01-10 DIAGNOSIS — M1990 Unspecified osteoarthritis, unspecified site: Secondary | ICD-10-CM | POA: Diagnosis not present

## 2015-01-10 DIAGNOSIS — R32 Unspecified urinary incontinence: Secondary | ICD-10-CM | POA: Diagnosis not present

## 2015-01-10 DIAGNOSIS — F1010 Alcohol abuse, uncomplicated: Secondary | ICD-10-CM | POA: Diagnosis not present

## 2015-01-12 ENCOUNTER — Ambulatory Visit (INDEPENDENT_AMBULATORY_CARE_PROVIDER_SITE_OTHER): Payer: Medicare Other | Admitting: Internal Medicine

## 2015-01-12 ENCOUNTER — Encounter: Payer: Self-pay | Admitting: Internal Medicine

## 2015-01-12 VITALS — BP 120/83 | HR 102 | Temp 97.9°F | Wt 231.1 lb

## 2015-01-12 DIAGNOSIS — N183 Chronic kidney disease, stage 3 unspecified: Secondary | ICD-10-CM

## 2015-01-12 DIAGNOSIS — Z09 Encounter for follow-up examination after completed treatment for conditions other than malignant neoplasm: Secondary | ICD-10-CM | POA: Diagnosis not present

## 2015-01-12 DIAGNOSIS — I10 Essential (primary) hypertension: Secondary | ICD-10-CM

## 2015-01-12 DIAGNOSIS — Z23 Encounter for immunization: Secondary | ICD-10-CM

## 2015-01-12 DIAGNOSIS — I129 Hypertensive chronic kidney disease with stage 1 through stage 4 chronic kidney disease, or unspecified chronic kidney disease: Secondary | ICD-10-CM

## 2015-01-12 DIAGNOSIS — N390 Urinary tract infection, site not specified: Secondary | ICD-10-CM

## 2015-01-12 NOTE — Patient Instructions (Signed)
[name] - -   Thank you for coming to your visit today!  Your blood pressure is better.  STOP taking amlodipine permanently.   Take your other medications as prescribed .  I will call you with the results of your blood work.   We will start the process to get you a new powerchair, please await a call from the clinic to set up the appointments you will need.   Thank you!  Please call with any questions - - [phone]

## 2015-01-12 NOTE — Progress Notes (Signed)
   Subjective:    Patient ID: Vanessa Palmer, female    DOB: 15-May-1950, 64 y.o.   MRN: [id number]  CC: 2 week check of BP and CKD  HPI  Vanessa Palmer is a 64yo woman, Vanessa Palmer presented 2 weeks ago and was found to have low blood pressure and worsening renal function.  I asked her to stop her amlodipine and come back to see me today.   Vanessa Palmer reports that Vanessa Palmer did stop taking her amlodipine.  Vanessa Palmer has not noticed any changes since that time (Vanessa Palmer was asymptomatic with the low blood pressure).  Vanessa Palmer denies any chest pain, blurry vision, dizziness, lightheadedness, falls.  Vanessa Palmer does not check her BP at home.  Vanessa Palmer has not noticed any urinary changes and reports improvement in her dysuria.  Vanessa Palmer has no specific complaints today.   Vanessa Palmer does note that Vanessa Palmer has had a powerchair for many years, but Vanessa Palmer would like a new one.  Vanessa Palmer has used it in the past for osteoarthritis, hip pain, knee pain, gait unsteadiness and frequent falls.  Vanessa Palmer just recently had hip replacement surgery and has needed to use it more.    Review of Systems  Constitutional: Negative for activity change, fatigue and unexpected weight change.  Eyes: Negative for photophobia and visual disturbance.  Cardiovascular: Negative for chest pain and leg swelling.  Genitourinary: Negative for dysuria, frequency and difficulty urinating.  Musculoskeletal: Positive for back pain, arthralgias and gait problem.  Neurological: Negative for dizziness, weakness and light-headedness.       Objective:   Physical Exam  Constitutional: Vanessa Palmer is oriented to person, place, and time. Vanessa Palmer appears well-developed and well-nourished. No distress.  HENT:  Head: Normocephalic and atraumatic.  Eyes: Conjunctivae are normal. No scleral icterus.  Cardiovascular: Normal rate, regular rhythm and normal heart sounds.   No murmur heard. Pulmonary/Chest: Effort normal and breath sounds normal. No respiratory distress. Vanessa Palmer has no wheezes.  Musculoskeletal:  Some hip  pain on palpation, walking with 3 point cane.   Neurological: Vanessa Palmer is alert and oriented to person, place, and time. Vanessa Palmer exhibits normal muscle tone.  Psychiatric: Vanessa Palmer has a normal mood and affect. Her behavior is normal.    BMET today      Assessment & Plan:  RTC in 3-4 months, sooner if needed

## 2015-01-12 NOTE — Assessment & Plan Note (Signed)
Symptoms seem resolved today.  Recheck renal function.

## 2015-01-12 NOTE — Assessment & Plan Note (Signed)
Renal function worsened at last visit, possibly related to acute UTI and low blood pressure.  Her blood pressure was acutely low, possibly related to over medication.  Amlodipine was stopped at that time.  Rechecking renal function today.

## 2015-01-12 NOTE — Assessment & Plan Note (Signed)
BP Readings from Last 3 Encounters:  01/12/15 120/83  12/29/14 99/67  12/16/14 143/82    Lab Results  Component Value Date   NA 140 12/29/2014   K 3.8 12/29/2014   CREATININE 1.50* 12/29/2014    Assessment: Comments: Much improved since I last saw her and she stopped metoprolol  Plan: Medications:  continue current medications, Metoprolol only Educational resources provided:   Self management tools provided:   Other plans: BMET today, pending

## 2015-01-13 DIAGNOSIS — Z5181 Encounter for therapeutic drug level monitoring: Secondary | ICD-10-CM | POA: Diagnosis not present

## 2015-01-13 LAB — BMP8+ANION GAP
Anion Gap: 20 mmol/L — ABNORMAL HIGH (ref 10.0–18.0)
BUN / CREAT RATIO: 10 — AB (ref 11–26)
BUN: 13 mg/dL (ref 8–27)
CHLORIDE: 99 mmol/L (ref 97–106)
CO2: 20 mmol/L (ref 18–29)
Calcium: 8.6 mg/dL — ABNORMAL LOW (ref 8.7–10.3)
Creatinine, Ser: 1.35 mg/dL — ABNORMAL HIGH (ref 0.57–1.00)
GFR calc non Af Amer: 42 mL/min/{1.73_m2} — ABNORMAL LOW (ref >59)
GFR, EST AFRICAN AMERICAN: 49 mL/min/{1.73_m2} — AB (ref >59)
GLUCOSE: 106 mg/dL — AB (ref 65–99)
Potassium: 3.8 mmol/L (ref 3.5–5.2)
SODIUM: 139 mmol/L (ref 136–144)

## 2015-01-18 DIAGNOSIS — Z5181 Encounter for therapeutic drug level monitoring: Secondary | ICD-10-CM | POA: Diagnosis not present

## 2015-01-18 DIAGNOSIS — F1020 Alcohol dependence, uncomplicated: Secondary | ICD-10-CM | POA: Diagnosis not present

## 2015-01-24 DIAGNOSIS — Z5181 Encounter for therapeutic drug level monitoring: Secondary | ICD-10-CM | POA: Diagnosis not present

## 2015-01-24 DIAGNOSIS — F1020 Alcohol dependence, uncomplicated: Secondary | ICD-10-CM | POA: Diagnosis not present

## 2015-01-26 DIAGNOSIS — Z5181 Encounter for therapeutic drug level monitoring: Secondary | ICD-10-CM | POA: Diagnosis not present

## 2015-01-27 DIAGNOSIS — Z5181 Encounter for therapeutic drug level monitoring: Secondary | ICD-10-CM | POA: Diagnosis not present

## 2015-01-28 ENCOUNTER — Other Ambulatory Visit: Payer: Self-pay | Admitting: Internal Medicine

## 2015-01-28 DIAGNOSIS — M2550 Pain in unspecified joint: Secondary | ICD-10-CM

## 2015-01-28 MED ORDER — OXYCODONE-ACETAMINOPHEN 10-325 MG PO TABS: 1.0000 | ORAL_TABLET | Freq: Three times a day (TID) | ORAL | Status: DC | PRN

## 2015-01-28 NOTE — Telephone Encounter (Signed)
Pt requesting percocet to be filled. °

## 2015-01-28 NOTE — Telephone Encounter (Signed)
Last refill 10/18 Last office visit 11/16 Last UDS 11 2015 No future PCP apt scheduled

## 2015-01-31 DIAGNOSIS — Z5181 Encounter for therapeutic drug level monitoring: Secondary | ICD-10-CM | POA: Diagnosis not present

## 2015-01-31 NOTE — Telephone Encounter (Signed)
Called to inform RX ready for pick-up, left VM

## 2015-02-02 DIAGNOSIS — Z5181 Encounter for therapeutic drug level monitoring: Secondary | ICD-10-CM | POA: Diagnosis not present

## 2015-02-03 DIAGNOSIS — Z5181 Encounter for therapeutic drug level monitoring: Secondary | ICD-10-CM | POA: Diagnosis not present

## 2015-02-08 DIAGNOSIS — Z5181 Encounter for therapeutic drug level monitoring: Secondary | ICD-10-CM | POA: Diagnosis not present

## 2015-02-11 ENCOUNTER — Telehealth: Payer: Self-pay | Admitting: Dietician

## 2015-02-11 ENCOUNTER — Ambulatory Visit (INDEPENDENT_AMBULATORY_CARE_PROVIDER_SITE_OTHER): Payer: Medicare Other | Admitting: Dietician

## 2015-02-11 ENCOUNTER — Encounter: Payer: Self-pay | Admitting: Dietician

## 2015-02-11 VITALS — Ht 61.25 in | Wt 226.6 lb

## 2015-02-11 DIAGNOSIS — N183 Chronic kidney disease, stage 3 unspecified: Secondary | ICD-10-CM

## 2015-02-11 DIAGNOSIS — Z6841 Body Mass Index (BMI) 40.0 and over, adult: Secondary | ICD-10-CM

## 2015-02-11 DIAGNOSIS — Z713 Dietary counseling and surveillance: Secondary | ICD-10-CM

## 2015-02-11 NOTE — Patient Instructions (Signed)
Your goal for the next few weeks to hep you loose weight is to eat more vegetables  To help you do this you plan to buy some frozen vegetables, make soup and freeze it on portions.  Remember, best time to take walks is after eating.   Don't lay down after eating, 5 minute walks is fine!  Learn how to eat 2 vegetables a day, 2 dairy (small  YOGURTs) and  2 fruits a day

## 2015-02-11 NOTE — Progress Notes (Signed)
  Medical Nutrition Therapy:  Appt start time: 1055    end time:  1200. Visit # 1 out of 3-4 visits  Assessment:  Primary concerns today: weight loss and healthy eating  to control blood sugars and improve kidney functions.  [name] is very interested in changing how she eats to help her reduce her weight to less than 200 #. She has already cut back on bread and fried foods and BBQ meats like chicken, ribs. She has chronic kidney disease that we discussed today would also be better if she ate a healthier diet.  She was counseled on a  DASH type diet with non meat source of protein since no potassium restriction needed at this time.   Preferred Learning Style: Auditory, Visual and Hands on Learning Readiness: Ready  ANTHROPOMETRICS: weight: 226.6# (decreased ~ 5 # since last visit) , height: 61.25, BMI WEIGHT HISTORY: tried to decrease her weight once and lost too much at one time, regained SLEEP:sometimes gets up 2-3 am and cannot get back to sleep, every night, seldom naps during the day, on some days lays down at 5 Pm sleeps until 11 pm, took meds, then got up at 6, usual bedtime 11- 1 am, 2 days a week goes to bed earlier than 11 pm TV time/day: she estimates 4 hours a day MEDICATIONS: reviewed  DIETARY INTAKE: Usual eating pattern includes 3 meals and 1-2 snacks per day. Everyday foods include cheese, candy, cookies, pizza, chips.soda  Avoided foods include most beans except green beans, spinach,carrots, broccoli, milk, yogurt 24-hr recall: wakes at 6 AM  B (6 AM): fruit cup to take pills  L ( 1230-1 PM): eats lunch at program daily M- F on [address], cheese -hamburger and cheese doodles,Chips, hot dogs, taco salad, tuna sandwich, spaghetti & salad Chips candy, cookies but she doesn't eat it D ( 7-8 PM): pizza from [employer], pepperoni- she eats 2 slices, drinks lemonade, sandwich- pimento cheese, chicken salad homemade, tuna salad or tossed salad, kraut & wienies 3 am-  apple Beverages: peach soda 2x/week, water  Fruit: 1-2 times a day Vegetables:1 time a day Dairy:0-1 time a day Nuts: hardly ever Usual physical activity: walks halls where she lives a few times a day  Estimated energy needs: 1800-2000 calories 200-225g carbohydrates   Progress Towards Goal(s):  In progress.   Nutritional Diagnosis:  NB-1.1 Food and nutrition-related knowledge deficit As related to lack of sufficient prior meal planning training and education.  As evidenced by her report and questions. .    Intervention:  Nutrition education and training on healthy food choices, meal patterns and balanced high nutrient value meals. Coordination of care:   Teaching Method Utilized: Visual,,Auditory,Hands on Handouts given during visit include: plate method of meal planning Barriers to learning/adherence to lifestyle change: support, her unhealthy food environment Demonstrated degree of understanding via:  Teach Back   Monitoring/Evaluation:  Dietary intake, exercise, and body weight in 3 week(s).

## 2015-02-11 NOTE — Telephone Encounter (Signed)
Running late. CDE agreed to see her up to 11 AM.

## 2015-02-14 ENCOUNTER — Other Ambulatory Visit: Payer: Self-pay | Admitting: Internal Medicine

## 2015-02-14 DIAGNOSIS — I10 Essential (primary) hypertension: Secondary | ICD-10-CM

## 2015-02-14 DIAGNOSIS — N183 Chronic kidney disease, stage 3 unspecified: Secondary | ICD-10-CM

## 2015-02-14 MED ORDER — METOPROLOL TARTRATE 50 MG PO TABS: 50.0000 mg | ORAL_TABLET | Freq: Every day | ORAL | Status: DC

## 2015-02-14 NOTE — Telephone Encounter (Signed)
Spoke with patient, she reports reviewing her med list printed with Lupita LeashDonna and noticing that she has 2 medication bottles on hand that are not listed.  Amlodipine and omeprazole.  Advised per OV note on 11/16 to throw out the amlodipine.  Also advised that it looks like omeprazole was changed to zantac but with a limited quantity.  She is not taking it but would like to have it refilled after education on what it is prescribed for.  Pt also need refill on her metoprolol.

## 2015-02-14 NOTE — Telephone Encounter (Signed)
Pt requesting the nurse to call back regarding meds.  

## 2015-02-16 DIAGNOSIS — Z5181 Encounter for therapeutic drug level monitoring: Secondary | ICD-10-CM | POA: Diagnosis not present

## 2015-02-22 ENCOUNTER — Other Ambulatory Visit: Payer: Self-pay | Admitting: Internal Medicine

## 2015-02-22 DIAGNOSIS — Z5181 Encounter for therapeutic drug level monitoring: Secondary | ICD-10-CM | POA: Diagnosis not present

## 2015-03-01 ENCOUNTER — Other Ambulatory Visit: Payer: Self-pay | Admitting: Internal Medicine

## 2015-03-01 DIAGNOSIS — M2550 Pain in unspecified joint: Secondary | ICD-10-CM

## 2015-03-01 NOTE — Telephone Encounter (Signed)
Pt requesting percocet to be filled. °

## 2015-03-02 MED ORDER — OXYCODONE-ACETAMINOPHEN 10-325 MG PO TABS: 1.0000 | ORAL_TABLET | Freq: Three times a day (TID) | ORAL | Status: DC | PRN

## 2015-03-03 ENCOUNTER — Other Ambulatory Visit: Payer: Self-pay | Admitting: Internal Medicine

## 2015-03-03 DIAGNOSIS — N183 Chronic kidney disease, stage 3 unspecified: Secondary | ICD-10-CM

## 2015-03-03 NOTE — Telephone Encounter (Signed)
Pt aware.

## 2015-03-10 ENCOUNTER — Ambulatory Visit (INDEPENDENT_AMBULATORY_CARE_PROVIDER_SITE_OTHER): Payer: Medicare Other | Admitting: Dietician

## 2015-03-10 VITALS — Wt 227.4 lb

## 2015-03-10 DIAGNOSIS — N183 Chronic kidney disease, stage 3 unspecified: Secondary | ICD-10-CM

## 2015-03-10 DIAGNOSIS — Z5181 Encounter for therapeutic drug level monitoring: Secondary | ICD-10-CM | POA: Diagnosis not present

## 2015-03-10 DIAGNOSIS — N289 Disorder of kidney and ureter, unspecified: Secondary | ICD-10-CM | POA: Diagnosis not present

## 2015-03-10 DIAGNOSIS — F1920 Other psychoactive substance dependence, uncomplicated: Secondary | ICD-10-CM | POA: Diagnosis not present

## 2015-03-10 DIAGNOSIS — Z713 Dietary counseling and surveillance: Secondary | ICD-10-CM | POA: Diagnosis not present

## 2015-03-10 DIAGNOSIS — I129 Hypertensive chronic kidney disease with stage 1 through stage 4 chronic kidney disease, or unspecified chronic kidney disease: Secondary | ICD-10-CM | POA: Diagnosis not present

## 2015-03-10 NOTE — Patient Instructions (Signed)
Please write down everything you eat and drink for one day.  Bring it back in February when you come.  Please try to eat oatmeal in the morning for breakfast

## 2015-03-10 NOTE — Progress Notes (Addendum)
  Medical Nutrition Therapy:  Appt start time: 0920    end time:  1020. Visit # 2 out of 3-4 visits  Assessment:  Primary concerns today: weight loss and healthy eating  to control blood sugars and improve/stablize CKD stage III.  Vanessa Palmer weight goal is less than 200 #. She ate more bread and other foods but not sweets during the holidays.  She was counseled on a  DASH type meal plan with recommended calories and servings sizes and servings per day today. She uses frozen vegdetables  Preferred Learning Style: Auditory, Visual and Hands on Learning Readiness: Ready  ANTHROPOMETRICS: weight:no change SLEEP:reports that it is okay TV time/day: she estimates 4 hours a day MEDICATIONS: reviewed- see medication section of epic, pill cutter  DIETARY INTAKE: Usual eating pattern includes 3 meals and 1-2 snacks per day. Everyday foods include cheese, candy, cookies, pizza, chips.soda  Avoided foods include most beans except green beans, spinach,carrots, broccoli, milk, yogurt 24-hr recall: wakes at 6 AM  B (6 AM): skipped  L ( 1230-1 PM): eats lunch at program daily M- F on [address],salad, cold cut sandwich, chips, water Chips candy, cookies but she doesn't eat it D ( 7-8 PM): homemade chicken and vegetable soup or 1/2 a hot pocket left from her grandchild 3 am- can of vienna sausages Beverages: peach soda 2x/week, water  Fruit: 1-2 times a day Vegetables:1 time a day Dairy:0-1 time a day Nuts: hardly ever- says she is allergic to entire food group of beana dn nuts, does nto eat seeds. May need magnesim supplementation as she is not ready to include more whole grains.  Usual physical activity: walks halls (5 minutes) where she lives every other day, most activities are sedentary  Estimated energy needs: 1800-2000 calories for weight maintenance, 1600 for gradual loss 200-225g carbohydrates   Progress Towards Goal(s):  In progress.   Nutritional Diagnosis:  NB-1.1 Food and  nutrition-related knowledge deficit As related to lack of sufficient prior meal planning training and education.  As evidenced by her report and questions. .    Intervention:  Nutrition education and training on healthy food choices, meal patterns and balanced high nutrient value meals. Coordination of care: recommend multivitamin with mineral for 50+ females  Teaching Method Utilized: Visual,,Auditory,Hands on Handouts given during visit include: plate method of meal planning Barriers to learning/adherence to lifestyle change: support, her unhealthy food environment Demonstrated degree of understanding via:  Teach Back   Monitoring/Evaluation:  Dietary intake, exercise, and body weight in 4 week(s).

## 2015-03-11 DIAGNOSIS — R609 Edema, unspecified: Secondary | ICD-10-CM | POA: Diagnosis not present

## 2015-03-11 DIAGNOSIS — M1990 Unspecified osteoarthritis, unspecified site: Secondary | ICD-10-CM | POA: Diagnosis not present

## 2015-03-11 DIAGNOSIS — J45901 Unspecified asthma with (acute) exacerbation: Secondary | ICD-10-CM | POA: Diagnosis not present

## 2015-03-11 DIAGNOSIS — R32 Unspecified urinary incontinence: Secondary | ICD-10-CM | POA: Diagnosis not present

## 2015-03-15 DIAGNOSIS — E785 Hyperlipidemia, unspecified: Secondary | ICD-10-CM | POA: Diagnosis not present

## 2015-03-15 DIAGNOSIS — I2510 Atherosclerotic heart disease of native coronary artery without angina pectoris: Secondary | ICD-10-CM | POA: Diagnosis not present

## 2015-03-15 DIAGNOSIS — I129 Hypertensive chronic kidney disease with stage 1 through stage 4 chronic kidney disease, or unspecified chronic kidney disease: Secondary | ICD-10-CM | POA: Diagnosis not present

## 2015-03-15 DIAGNOSIS — I252 Old myocardial infarction: Secondary | ICD-10-CM | POA: Diagnosis not present

## 2015-03-15 DIAGNOSIS — N189 Chronic kidney disease, unspecified: Secondary | ICD-10-CM | POA: Diagnosis not present

## 2015-03-16 DIAGNOSIS — Z5181 Encounter for therapeutic drug level monitoring: Secondary | ICD-10-CM | POA: Diagnosis not present

## 2015-03-16 DIAGNOSIS — F1020 Alcohol dependence, uncomplicated: Secondary | ICD-10-CM | POA: Diagnosis not present

## 2015-03-23 DIAGNOSIS — Z5181 Encounter for therapeutic drug level monitoring: Secondary | ICD-10-CM | POA: Diagnosis not present

## 2015-03-29 ENCOUNTER — Other Ambulatory Visit: Payer: Self-pay | Admitting: Internal Medicine

## 2015-03-29 DIAGNOSIS — M2550 Pain in unspecified joint: Secondary | ICD-10-CM

## 2015-03-29 NOTE — Telephone Encounter (Signed)
Pt requesting percocet to be filled. °

## 2015-03-31 ENCOUNTER — Other Ambulatory Visit: Payer: Self-pay | Admitting: Internal Medicine

## 2015-03-31 NOTE — Telephone Encounter (Signed)
Hey Leigh - -   I will not be in the office tomorrow, can you ask the attending of the day to fill?   Ms. Ogarro is appropriate.  She usually gets 3 Rx for 3 months, but if the attending does not want to do that it's okay.   Thanks   EBM

## 2015-04-01 MED ORDER — OXYCODONE-ACETAMINOPHEN 10-325 MG PO TABS: 1.0000 | ORAL_TABLET | Freq: Three times a day (TID) | ORAL | Status: DC | PRN

## 2015-04-01 NOTE — Telephone Encounter (Signed)
Pt informed

## 2015-04-01 NOTE — Telephone Encounter (Signed)
Will approve one month refill of oxycodone. She has a follow up appt with Dr. Criselda Peaches on 2/15 where she can get a three month supply if appropriate.   Thanks.  DTV

## 2015-04-13 ENCOUNTER — Ambulatory Visit (INDEPENDENT_AMBULATORY_CARE_PROVIDER_SITE_OTHER): Payer: Medicare Other | Admitting: Internal Medicine

## 2015-04-13 ENCOUNTER — Ambulatory Visit (INDEPENDENT_AMBULATORY_CARE_PROVIDER_SITE_OTHER): Payer: Medicare Other | Admitting: Dietician

## 2015-04-13 ENCOUNTER — Encounter: Payer: Self-pay | Admitting: Internal Medicine

## 2015-04-13 VITALS — BP 146/74 | HR 62 | Temp 98.1°F | Ht 62.0 in | Wt 229.7 lb

## 2015-04-13 DIAGNOSIS — M1611 Unilateral primary osteoarthritis, right hip: Secondary | ICD-10-CM | POA: Diagnosis not present

## 2015-04-13 DIAGNOSIS — N183 Chronic kidney disease, stage 3 unspecified: Secondary | ICD-10-CM

## 2015-04-13 DIAGNOSIS — Z6841 Body Mass Index (BMI) 40.0 and over, adult: Secondary | ICD-10-CM

## 2015-04-13 DIAGNOSIS — I10 Essential (primary) hypertension: Secondary | ICD-10-CM | POA: Diagnosis not present

## 2015-04-13 DIAGNOSIS — Z79899 Other long term (current) drug therapy: Secondary | ICD-10-CM | POA: Diagnosis not present

## 2015-04-13 DIAGNOSIS — N289 Disorder of kidney and ureter, unspecified: Secondary | ICD-10-CM

## 2015-04-13 DIAGNOSIS — Z713 Dietary counseling and surveillance: Secondary | ICD-10-CM

## 2015-04-13 NOTE — Patient Instructions (Addendum)
  Your plan to help you with weight loss:  1- Drink Sugar free/Zero Calorie/Diet drinks instead of sweet drinks like FOOD      LION SWEET ATea- FOOD LION SUGAR FREE LEMONADE  2- TRY a Silver Sneakers Class before your next visit:       Your can maybe do a bike at [employer] or      Go to a dancing ZUMBA class at [employer]  3- Keep trying to eat more vegetables- you can eat large portions!!!

## 2015-04-13 NOTE — Progress Notes (Signed)
She says she is confused about if she is supposed to take AMLODIPINE. Referred her to Dr. Criselda Palmer who she is seeing today   Medical Nutrition Therapy:  Appt start time: 0900    end time:  1000. Visit # 3 out of 6-10 visits  Assessment:  Primary concerns today: weight loss and healthy eating  to control blood sugars and improve kidney functions.  Vanessa Palmer weight goal is less than 200 #. She reports eating larger portions of food due to family stress. She does sedentary activities to try to cope with her stress. She is also drinking sweet tea. She has cut out chips and regular soda. However, her weight is increased today  Preferred Learning Style: Auditory, Visual and Hands on Learning Readiness: Ready  ANTHROPOMETRICS: weight:increased 3# in past month, BMI- 42.6 SLEEP:reports that it is okay, but today says she wakes numerous times to urinate TV time/day: she estimates 4 hours a day MEDICATIONS: reviewed- see medication section of epic, prednisone can cause weight gain, but it does not appears that she is taking this. It was not in her bag of medicines and she has not refills  DIETARY INTAKE: Usual eating pattern includes 3 meals and 1-2 snacks per day. Everyday foods include cheese, candy, cookies, pizza.   24-hr recall: not done today Eats lunch at program daily M- F on [employer], that serves high fat, processed and starchy, low nutrient dense foods. Excess caloric intake as a coping strategy for increased stress  Estimated energy needs: 1800-2000 calories for weight maintenance, 1600 for gradual loss 200-225g carbohydrates   Progress Towards Goal(s):  No progress.   Nutritional Diagnosis:  NB-1.1 Food and nutrition-related knowledge deficit As related to lack of sufficient prior meal planning training and education.  As evidenced by her report and questions. .    Intervention:  Nutrition education and training on specific healthy food choices that may help with improved  nutrition and weight loss.Alternative ways to  cope with increased stress. Recommended decrease in fluids after 6 PM for improved sleep.  Assisted patient in signing up for silver sneakers.  Coordination of care: recommend multivitamin with mineral for 50+ females  Teaching Method Utilized: Visual,,Auditory,Hands on Handouts given during visit include: plate method of meal planning Barriers to learning/adherence to lifestyle change: support, her unhealthy food environment Demonstrated degree of understanding via:  Teach Back   Monitoring/Evaluation:  Dietary intake, exercise, and body weight in 4 week(s).

## 2015-04-13 NOTE — Patient Instructions (Signed)
Vanessa Palmer - -   Today you had an evaluation for a powered wheelchair.    I have sent in a referral for you to see an eye doctor for your vision.   Please come back and see me in 3 months.   For you blood pressure, please START taking amlodipine  daily.   Thank you!  Amlodipine tablets What is this medicine? AMLODIPINE (am LOE di peen) is a calcium-channel blocker. It affects the amount of calcium found in your heart and muscle cells. This relaxes your blood vessels, which can reduce the amount of work the heart has to do. This medicine is used to lower high blood pressure. It is also used to prevent chest pain. This medicine may be used for other purposes; ask your health care provider or pharmacist if you have questions. What should I tell my health care provider before I take this medicine? They need to know if you have any of these conditions: -heart problems like heart failure or aortic stenosis -liver disease -an unusual or allergic reaction to amlodipine, other medicines, foods, dyes, or preservatives -pregnant or trying to get pregnant -breast-feeding How should I use this medicine? Take this medicine by mouth with a glass of water. Follow the directions on the prescription label. Take your medicine at regular intervals. Do not take more medicine than directed. Talk to your pediatrician regarding the use of this medicine in children. Special care may be needed. This medicine has been used in children as young as 6. Persons over 44 years old may have a stronger reaction to this medicine and need smaller doses. Overdosage: If you think you have taken too much of this medicine contact a poison control center or emergency room at once. NOTE: This medicine is only for you. Do not share this medicine with others. What if I miss a dose? If you miss a dose, take it as soon as you can. If it is almost time for your next dose, take only that dose. Do not take double or extra  doses. What may interact with this medicine? -herbal or dietary supplements -local or general anesthetics -medicines for high blood pressure -medicines for prostate problems -rifampin This list may not describe all possible interactions. Give your health care provider a list of all the medicines, herbs, non-prescription drugs, or dietary supplements you use. Also tell them if you smoke, drink alcohol, or use illegal drugs. Some items may interact with your medicine. What should I watch for while using this medicine? Visit your doctor or health care professional for regular check ups. Check your blood pressure and pulse rate regularly. Ask your health care professional what your blood pressure and pulse rate should be, and when you should contact him or her. This medicine may make you feel confused, dizzy or lightheaded. Do not drive, use machinery, or do anything that needs mental alertness until you know how this medicine affects you. To reduce the risk of dizzy or fainting spells, do not sit or stand up quickly, especially if you are an older patient. Avoid alcoholic drinks; they can make you more dizzy. Do not suddenly stop taking amlodipine. Ask your doctor or health care professional how you can gradually reduce the dose. What side effects may I notice from receiving this medicine? Side effects that you should report to your doctor or health care professional as soon as possible: -allergic reactions like skin rash, itching or hives, swelling of the face, lips, or tongue -breathing problems -changes  in vision or hearing -chest pain -fast, irregular heartbeat -swelling of legs or ankles Side effects that usually do not require medical attention (report to your doctor or health care professional if they continue or are bothersome): -dry mouth -facial flushing -nausea, vomiting -stomach gas, pain -tired, weak -trouble sleeping This list may not describe all possible side effects. Call  your doctor for medical advice about side effects. You may report side effects to FDA at 1-800-FDA-1088. Where should I keep my medicine? Keep out of the reach of children. Store at room temperature between 59 and 86 degrees F (15 and 30 degrees C). Protect from light. Keep container tightly closed. Throw away any unused medicine after the expiration date. NOTE: This sheet is a summary. It may not cover all possible information. If you have questions about this medicine, talk to your doctor, pharmacist, or health care provider.    2016, Elsevier/Gold Standard. (2012-01-11 11:40:58)

## 2015-04-13 NOTE — Progress Notes (Signed)
Subjective:    Patient ID: Vanessa Palmer, female    DOB: 1951/01/12, 65 y.o.   MRN: [id number]  Power wheelchair assessment  HPI  [name] comes in today for powered wheelchair evaluation.    She has had a powered chair for some time in the home.  She has been using an older model for years and has no issues with using it and is very motivated to use it.    Uses to go to bathroom and take shower, fixes breakfast and wash dishes at home.  Aide comes in to help with some ADLs.  Uses to go to dumpster which is quite a distance away. Has fallen when attempting to take out trash.  Can't stand at sink or oven for long enough to cook meals, uses wheelchair to get to bathroom in a timely manner.  She has had multiple falls in attempting to do her ADLs given her knee pain and weakness at the knees causing them to give out. (weakness is subjective, pain is the main issue).  ADLs that are impaired include toileting, bathing and feeding.   She has pain in multiple sites including:   Knee pain, bilateral - 10/10, improves to a 4/10 with medication.  Sharp pain.  + knee locking and giving out when she tries to exercise.  Fell 2 times last month while walking, knees gave out.    Hip pain on the right mainly - continues with numbness in the skin over the hip, needle like pain and hurts.  Hip pain better than before surgery, 7/10, improves to 4/10 with medication.    Shoulder - trouble with shoulder, with limited range of motion and pain with movement.  Has to use other hand to lift up arm up sometimes due to pain.    She does use a can and walker when she is out and cannot take the powered wheelchair with her.  She currently has concomitant shoulder pain.  She has limited range of motion in the shoulders as noted in her physical exam.    Home environment not suitable for scooter - lives in house/apartment, on the first floor but with doors that make turning scooter difficult.    Patient is willing  and motivated to use a powered chair.  She has had one for many years and has had excellent results with improved QOL in the home.    Why do you need in your home? - ADLs  Review of Systems  Constitutional: Positive for fatigue. Negative for fever and chills.  Respiratory: Negative for cough, shortness of breath and stridor.   Cardiovascular: Negative for chest pain and leg swelling.  Musculoskeletal: Positive for back pain, arthralgias and gait problem. Negative for joint swelling.  Skin: Negative for rash and wound.  Neurological: Positive for weakness. Negative for dizziness and light-headedness.  Psychiatric/Behavioral: Negative for confusion and dysphoric mood.       Objective:   Physical Exam  Constitutional: She is oriented to person, place, and time.  Alert and oriented, answering all questions appropriately  HENT:  Head: Normocephalic and atraumatic.  Eyes: Conjunctivae are normal. No scleral icterus.  Wearing glasses  Neck: Neck supple.  Cardiovascular: Normal rate, regular rhythm and normal heart sounds.   No murmur heard. Pulmonary/Chest: Effort normal and breath sounds normal. No respiratory distress. She has no wheezes.  Musculoskeletal:  She has a very delayed "get up and go," difficulty rising to a standing position, uses 4 point cane and continues  to favor right side with faltering gait, no falls in clinic.  She has limited ROM at the right shoulder due to pain.  Active range of motion is to the shoulder and passive is to about 70 degrees.  ROM for IR is limited as well with only being able to reach to lower back.  She has point tenderness over biceps tendon on the right.  On the left arm, she has normal active ROM and limited pain.  On the lower extremities, the flexion and extension of her right hip is severely decreased and very limited by pain.  Flexion of left hip is also decreased, but not to the same extent.   Lymphadenopathy:    She has no cervical adenopathy.    Neurological: She is alert and oriented to person, place, and time. No cranial nerve deficit.  On strength exam - - RUE: 4 out of 5 in grip and flexion.  This was also limited due to pain in the shoulder LUE: 5/5 strength throughout LLE: strength was 4/5 with flexion.   RLE: strength 5/5  Skin: Skin is warm and dry.  Psychiatric: She has a normal mood and affect. Her behavior is normal.   Filed Vitals:   04/13/15 1035  BP: 146/74  Pulse: 62  Temp: 98.1 F (36.7 C)   Filed Weights   04/13/15 1035  Weight: 229 lb 11.2 oz (104.191 kg)   Height 5'2"      Assessment & Plan:

## 2015-04-13 NOTE — Assessment & Plan Note (Signed)
Vanessa Palmer is here for evaluation for power chair which she uses due to OA and pain at multiple sites.   Plan:  Powered wheelchair documentation will be sent to [facility] Continue pain medication with percocet as previously ordered, she has improvement in pain with this regimen.

## 2015-04-13 NOTE — Assessment & Plan Note (Signed)
She was restarted on amlodipine by Cardiology.   Plan:  Continue amlodipine  daily.

## 2015-04-15 DIAGNOSIS — M1990 Unspecified osteoarthritis, unspecified site: Secondary | ICD-10-CM | POA: Diagnosis not present

## 2015-04-15 DIAGNOSIS — R32 Unspecified urinary incontinence: Secondary | ICD-10-CM | POA: Diagnosis not present

## 2015-04-15 DIAGNOSIS — R609 Edema, unspecified: Secondary | ICD-10-CM | POA: Diagnosis not present

## 2015-04-15 DIAGNOSIS — J45901 Unspecified asthma with (acute) exacerbation: Secondary | ICD-10-CM | POA: Diagnosis not present

## 2015-04-19 ENCOUNTER — Telehealth: Payer: Self-pay | Admitting: *Deleted

## 2015-04-19 NOTE — Telephone Encounter (Signed)
CALLED PATIENT LEFT VOICE MESSAGE REGARDING HER EYE APPOINTMENT WITH RICHARD GROAT ON MARCH 6.017 TO ARRIVE 12:45PM/ APPOINTMENT ALSO MAILED TO PATIENT.

## 2015-04-24 ENCOUNTER — Encounter (HOSPITAL_COMMUNITY): Payer: Self-pay | Admitting: Nurse Practitioner

## 2015-04-24 ENCOUNTER — Emergency Department (HOSPITAL_COMMUNITY): Payer: Medicare Other

## 2015-04-24 ENCOUNTER — Emergency Department (HOSPITAL_COMMUNITY)
Admission: EM | Admit: 2015-04-24 | Discharge: 2015-04-24 | Disposition: A | Discharge status: Home / Self Care | Payer: Medicare Other | Attending: Emergency Medicine | Admitting: Emergency Medicine

## 2015-04-24 DIAGNOSIS — M791 Myalgia, unspecified site: Secondary | ICD-10-CM

## 2015-04-24 DIAGNOSIS — R05 Cough: Secondary | ICD-10-CM | POA: Insufficient documentation

## 2015-04-24 DIAGNOSIS — Z79899 Other long term (current) drug therapy: Secondary | ICD-10-CM | POA: Diagnosis not present

## 2015-04-24 DIAGNOSIS — I129 Hypertensive chronic kidney disease with stage 1 through stage 4 chronic kidney disease, or unspecified chronic kidney disease: Secondary | ICD-10-CM | POA: Insufficient documentation

## 2015-04-24 DIAGNOSIS — I2510 Atherosclerotic heart disease of native coronary artery without angina pectoris: Secondary | ICD-10-CM | POA: Diagnosis not present

## 2015-04-24 DIAGNOSIS — N183 Chronic kidney disease, stage 3 (moderate): Secondary | ICD-10-CM | POA: Insufficient documentation

## 2015-04-24 DIAGNOSIS — K219 Gastro-esophageal reflux disease without esophagitis: Secondary | ICD-10-CM | POA: Diagnosis not present

## 2015-04-24 DIAGNOSIS — R0981 Nasal congestion: Secondary | ICD-10-CM | POA: Insufficient documentation

## 2015-04-24 DIAGNOSIS — E785 Hyperlipidemia, unspecified: Secondary | ICD-10-CM | POA: Diagnosis not present

## 2015-04-24 DIAGNOSIS — R509 Fever, unspecified: Secondary | ICD-10-CM | POA: Diagnosis not present

## 2015-04-24 DIAGNOSIS — M797 Fibromyalgia: Secondary | ICD-10-CM | POA: Diagnosis not present

## 2015-04-24 DIAGNOSIS — J3489 Other specified disorders of nose and nasal sinuses: Secondary | ICD-10-CM

## 2015-04-24 DIAGNOSIS — I509 Heart failure, unspecified: Secondary | ICD-10-CM | POA: Insufficient documentation

## 2015-04-24 DIAGNOSIS — Z7952 Long term (current) use of systemic steroids: Secondary | ICD-10-CM | POA: Insufficient documentation

## 2015-04-24 DIAGNOSIS — E669 Obesity, unspecified: Secondary | ICD-10-CM | POA: Diagnosis not present

## 2015-04-24 DIAGNOSIS — M109 Gout, unspecified: Secondary | ICD-10-CM | POA: Diagnosis not present

## 2015-04-24 DIAGNOSIS — J45909 Unspecified asthma, uncomplicated: Secondary | ICD-10-CM | POA: Diagnosis not present

## 2015-04-24 DIAGNOSIS — R059 Cough, unspecified: Secondary | ICD-10-CM

## 2015-04-24 DIAGNOSIS — R112 Nausea with vomiting, unspecified: Secondary | ICD-10-CM | POA: Diagnosis not present

## 2015-04-24 DIAGNOSIS — R5081 Fever presenting with conditions classified elsewhere: Secondary | ICD-10-CM | POA: Insufficient documentation

## 2015-04-24 MED ORDER — ONDANSETRON 4 MG PO TBDP
4.0000 mg | ORAL_TABLET | Freq: Once | ORAL | Status: AC
  Administered 2015-04-24: 4 mg via ORAL
  Filled 2015-04-24: qty 1

## 2015-04-24 MED ORDER — ONDANSETRON HCL 4 MG PO TABS: 4.0000 mg | ORAL_TABLET | Freq: Three times a day (TID) | ORAL | Status: AC | PRN

## 2015-04-24 NOTE — Discharge Instructions (Signed)

## 2015-04-24 NOTE — ED Notes (Signed)
She c/o 2 day history cough with sputum, body aches, chills, fevers, runny nose, congestion. She tried otc cold meds with no relief of symptoms. She is alert, mild labored breathing on exertion in triage.

## 2015-04-24 NOTE — ED Provider Notes (Signed)
CSN: [id number]     Arrival date & time 04/24/15  1423 History   First MD Initiated Contact with Patient 04/24/15 1700     Chief Complaint  Patient presents with  . Cough     (Consider location/radiation/quality/duration/timing/severity/associated sxs/prior Treatment) Patient is a 65 y.o. female presenting with flu symptoms.  Influenza Presenting symptoms: cough, fatigue, fever (102 at home), myalgias, nausea, rhinorrhea, sore throat and vomiting   Presenting symptoms: no diarrhea, no headaches and no shortness of breath   Severity:  Moderate Onset quality:  Gradual Progression:  Worsening Chronicity:  New Relieved by:  Nothing Worsened by:  Nothing tried Ineffective treatments:  OTC medications Associated symptoms: chills   Associated symptoms: no decreased appetite, no ear pain, no mental status change, no congestion and no neck stiffness   Risk factors: being elderly, kidney disease and sick contacts   Risk factors: no diabetes problem, no immunocompromised state and not pregnant     Past Medical History  Diagnosis Date  . Hyperlipidemia   . Hypertension   . Gout   . Obesity   . Depression   . DJD (degenerative joint disease)   . OSA (obstructive sleep apnea)     previously used CPAP, has lost  . CAD (coronary artery disease)     s/p non-Q wave MI in 06/2001 and 2004, 2005  . ALCOHOL ABUSE 12/13/2005    Annotation: Sober since 11/06 Qualifier: Diagnosis of  By: Wallace Cullens MD, Natalia Leatherwood    . INTRINSIC ASTHMA, WITH EXACERBATION 09/27/2009    Qualifier: Diagnosis of  By: Denton Meek MD, Tillie Rung    . Chronic kidney disease (CKD), stage III (moderate) 09/19/2010  . CHF (congestive heart failure) (HCC)   . DVT of lower extremity, bilateral (HCC) 04/04/2012    On Xarelto    . GERD (gastroesophageal reflux disease)   . Seizures (HCC)     As a teenager   . Headache     migraines  . Anemia    Past Surgical History  Procedure Laterality Date  . Partial hysterectomy      due to  endometriosis  . Tubal ligation    . Cardiac catheterization      06/2001, 02/2002, 10/2004 (10-15% proximal stenosis of circumflex, diffuse disease of  OM1 and RCA)  . Knee arthroscopy Left   . Colonoscopy    . Total hip arthroplasty Right 11/23/2014    Procedure: RIGHT TOTAL HIP ARTHROPLASTY ANTERIOR APPROACH;  Surgeon: Kathryne Hitch, MD;  Location: [facility] OR;  Service: Orthopedics;  Laterality: Right;   Family History  Problem Relation Age of Onset  . Mental illness Mother   . Heart disease Father   . Diabetes Sister   . Diabetes Sister   . Diabetes Sister    Social History  Substance Use Topics  . Smoking status: Never Smoker   . Smokeless tobacco: Never Used  . Alcohol Use: No     Comment: last alcohol use 02/27/14   OB History    No data available     Review of Systems  Constitutional: Positive for fever (102 at home), chills and fatigue. Negative for appetite change and decreased appetite.  HENT: Positive for rhinorrhea and sore throat. Negative for congestion, ear pain, facial swelling and mouth sores.   Eyes: Negative for visual disturbance.  Respiratory: Positive for cough. Negative for chest tightness and shortness of breath.   Cardiovascular: Negative for chest pain and palpitations.  Gastrointestinal: Positive for nausea and vomiting. Negative for abdominal pain,  diarrhea and blood in stool.  Endocrine: Negative for cold intolerance and heat intolerance.  Genitourinary: Negative for frequency, decreased urine volume and difficulty urinating.  Musculoskeletal: Positive for myalgias. Negative for back pain and neck stiffness.  Skin: Negative for rash.  Neurological: Negative for dizziness, weakness, light-headedness and headaches.  All other systems reviewed and are negative.     Allergies  Ace inhibitors; Lexiscan; Peanuts; Hydromorphone hcl; Percocet; Propoxyphene n-acetaminophen; and Vicodin  Home Medications   Prior to Admission medications    Medication Sig Start Date End Date Taking? Authorizing Provider  acetaminophen (TYLENOL) 500 MG tablet Take 1,000 mg by mouth every 6 (six) hours as needed for moderate pain.    Historical Provider, MD  amLODipine (NORVASC) 5 MG tablet  04/11/15   Historical Provider, MD  atorvastatin (LIPITOR) 40 MG tablet TAKE 1 TABLET (40 MG TOTAL) BY MOUTH DAILY. 03/31/15   Inez Catalina, MD  b complex vitamins tablet Take 1 tablet by mouth daily. Patient not taking: Reported on 03/10/2015 01/06/14   Inez Catalina, MD  buPROPion (WELLBUTRIN XL) 150 MG 24 hr tablet Take 1 tablet (150 mg total) by mouth every morning. 08/11/14   Inez Catalina, MD  famotidine (PEPCID) 20 MG tablet Take 1 tablet (20 mg total) by mouth 2 (two) times daily. 08/11/14   Inez Catalina, MD  fluticasone (FLONASE) 50 MCG/ACT nasal spray INSTILL TWO   SPRAYS IN EACH NOSTRIL ONCE DAILY 12/10/14   Inez Catalina, MD  fluticasone (FLOVENT HFA) 44 MCG/ACT inhaler Inhale 2 puffs into the lungs 2 (two) times daily. 06/02/13   Pasty Spillers McLean-Scocozza, MD  furosemide (LASIX) 20 MG tablet TAKE TWO   TABLETS (40 MG TOTAL) BY MOUTH DAILY. 02/23/15   Inez Catalina, MD  metoprolol (LOPRESSOR) 50 MG tablet Take 1 tablet (50 mg total) by mouth daily. 02/14/15 02/14/16  Inez Catalina, MD  nitroGLYCERIN (NITROSTAT) 0.4 MG SL tablet Place 0.4 mg under the tongue every 5 (five) minutes as needed for chest pain.    Historical Provider, MD  ondansetron (ZOFRAN) 4 MG tablet Take 1 tablet (4 mg total) by mouth every 8 (eight) hours as needed for nausea or vomiting. 04/25/15 04/27/15  Drema Pry, MD  oxyCODONE-acetaminophen (PERCOCET) 10-325 MG tablet Take 1 tablet by mouth every 8 (eight) hours as needed for pain. 04/01/15 03/10/16  Tyson Alias, MD  predniSONE (DELTASONE) 50 MG tablet Take 50 mg by mouth daily with breakfast. Reported on 03/10/2015    Historical Provider, MD  ranitidine (ZANTAC) 150 MG tablet Take 1 tablet (150 mg total) by mouth 2 (two)  times daily. 11/01/14   Angelina Ok, MD  rivaroxaban (XARELTO) 20 MG TABS tablet TAKE 1 TABLET (20 MG TOTAL) BY MOUTH DAILY WITH SUPPER. Patient taking differently: Take 20 mg by mouth daily.  05/05/14   Inez Catalina, MD  tiZANidine (ZANAFLEX) 4 MG tablet Take 1 tablet (4 mg total) by mouth every 6 (six) hours as needed for muscle spasms. 11/26/14   Kathryne Hitch, MD  TOVIAZ 8 MG TB24 Take 8 mg by mouth Daily. Reported on 03/10/2015 11/01/11   Historical Provider, MD  traZODone (DESYREL) 50 MG tablet TAKE 0.5 TABLETS (25 MG TOTAL) BY MOUTH AT BEDTIME. Patient not taking: Reported on 03/10/2015 06/18/14   Burns Spain, MD   BP 128/83 mmHg  Pulse 66  Temp(Src) 98.9 F (37.2 C) (Oral)  Resp 21  SpO2 98% Physical Exam  Constitutional: She is  oriented to person, place, and time. She appears well-developed and well-nourished. No distress.  HENT:  Head: Normocephalic and atraumatic.  Right Ear: External ear normal.  Left Ear: External ear normal.  Nose: Nose normal.  Frontal and maxillary sinus tenderness   Eyes: Conjunctivae and EOM are normal. Pupils are equal, round, and reactive to light. Right eye exhibits no discharge. Left eye exhibits no discharge. No scleral icterus.  Neck: Normal range of motion. Neck supple.  Cardiovascular: Normal rate, regular rhythm and normal heart sounds.  Exam reveals no gallop and no friction rub.   No murmur heard. Pulmonary/Chest: Effort normal and breath sounds normal. No stridor. No respiratory distress. She has no wheezes.  Abdominal: Soft. She exhibits no distension. There is no tenderness.  Musculoskeletal: She exhibits no edema or tenderness.  Neurological: She is alert and oriented to person, place, and time.  Skin: Skin is warm and dry. No rash noted. She is not diaphoretic. No erythema.  Psychiatric: She has a normal mood and affect.    ED Course  Procedures (including critical care time) Labs Review Labs Reviewed  INFLUENZA  PANEL BY PCR (TYPE A & B, H1N1)    Imaging Review Dg Chest 2 View  04/24/2015  CLINICAL DATA:  Patient body aches, fevers, chills and shortness of breath. EXAM: CHEST  2 VIEW COMPARISON:  Chest radiograph 04/25/2012. FINDINGS: Normal cardiac and mediastinal contours. No consolidative pulmonary opacities. No pleural effusion or pneumothorax. Thoracic spine degenerative changes. IMPRESSION: No acute cardiopulmonary process. Electronically Signed   By: Annia Belt M.D.   On: 04/24/2015 15:40   I have personally reviewed and evaluated these images and lab results as part of my medical decision-making.   EKG Interpretation   Date/Time:  Sunday April 24 2015 15:08:14 EST Ventricular Rate:  83 PR Interval:  156 QRS Duration: 70 QT Interval:  366 QTC Calculation: 430 R Axis:   68 Text Interpretation:  Normal sinus rhythm Normal ECG No significant change  since last tracing Confirmed by FLOYD MD, Reuel Boom ([id number]) on 04/24/2015  4:36:44 PM      MDM   65 year old female presents with flulike symptoms for the past 3 days. History as above. On arrival patient is afebrile with stable vital signs. She is ill-appearing however looks well-hydrated with no evidence of toxicity. Chest x-ray without evidence of pneumonia. Patient was able to tolerate by mouth. Presentation most consistent with viral process possibly influenza. She is out of the window for Tamiflu at this time.   She will be provided with a prescription for Zofran to allow for appropriate oral hydration at home. She is safe for discharge with strict return precautions. She is follow-up with her PCP as needed.  Chest x-ray was interpreted by me and use to my clinical decision-making.  Patient was seen in conjunction with Dr. Adela Lank  Final diagnoses:  Myalgia  Non-intractable vomiting with nausea, vomiting of unspecified type  Other specified fever  Cough  Rhinorrhea  Nasal congestion        Drema Pry, MD 04/24/15  1745  Melene Plan, DO 04/24/15 1752

## 2015-04-25 ENCOUNTER — Other Ambulatory Visit: Payer: Self-pay | Admitting: Internal Medicine

## 2015-04-25 DIAGNOSIS — M2550 Pain in unspecified joint: Secondary | ICD-10-CM

## 2015-04-25 NOTE — Telephone Encounter (Signed)
Last visit 2/15 next visit is to be in may Last filled 2/3 Last uds 2015

## 2015-04-25 NOTE — Telephone Encounter (Signed)
Needs pain medication refill

## 2015-04-27 ENCOUNTER — Ambulatory Visit: Payer: Medicare Other | Admitting: Dietician

## 2015-04-27 MED ORDER — OXYCODONE-ACETAMINOPHEN 10-325 MG PO TABS: 1.0000 | ORAL_TABLET | Freq: Three times a day (TID) | ORAL | Status: DC | PRN

## 2015-04-27 NOTE — Telephone Encounter (Signed)
Pt informed

## 2015-04-28 ENCOUNTER — Other Ambulatory Visit: Payer: Self-pay | Admitting: Internal Medicine

## 2015-04-29 ENCOUNTER — Other Ambulatory Visit: Payer: Self-pay | Admitting: Internal Medicine

## 2015-04-30 ENCOUNTER — Other Ambulatory Visit: Payer: Self-pay | Admitting: Internal Medicine

## 2015-04-30 NOTE — Telephone Encounter (Signed)
Appears she is no longer on omeprazole, only ranitidine.  We can discuss at next visit or she can call in to discuss.

## 2015-05-02 DIAGNOSIS — H04123 Dry eye syndrome of bilateral lacrimal glands: Secondary | ICD-10-CM | POA: Diagnosis not present

## 2015-05-02 DIAGNOSIS — H2513 Age-related nuclear cataract, bilateral: Secondary | ICD-10-CM | POA: Diagnosis not present

## 2015-05-03 ENCOUNTER — Ambulatory Visit (HOSPITAL_COMMUNITY)
Admission: RE | Admit: 2015-05-03 | Discharge: 2015-05-03 | Disposition: A | Discharge status: Home / Self Care | Payer: Medicare Other | Source: Ambulatory Visit | Attending: Student in an Organized Health Care Education/Training Program | Admitting: Student in an Organized Health Care Education/Training Program

## 2015-05-03 ENCOUNTER — Encounter: Payer: Self-pay | Admitting: Dietician

## 2015-05-03 ENCOUNTER — Ambulatory Visit (INDEPENDENT_AMBULATORY_CARE_PROVIDER_SITE_OTHER): Payer: Medicare Other | Admitting: Dietician

## 2015-05-03 ENCOUNTER — Encounter: Payer: Self-pay | Admitting: Internal Medicine

## 2015-05-03 ENCOUNTER — Ambulatory Visit (INDEPENDENT_AMBULATORY_CARE_PROVIDER_SITE_OTHER): Payer: Medicare Other | Admitting: Internal Medicine

## 2015-05-03 VITALS — Wt 212.2 lb

## 2015-05-03 VITALS — BP 135/92 | HR 64 | Temp 98.1°F | Wt 212.1 lb

## 2015-05-03 DIAGNOSIS — N183 Chronic kidney disease, stage 3 unspecified: Secondary | ICD-10-CM

## 2015-05-03 DIAGNOSIS — R05 Cough: Secondary | ICD-10-CM | POA: Diagnosis not present

## 2015-05-03 DIAGNOSIS — J111 Influenza due to unidentified influenza virus with other respiratory manifestations: Secondary | ICD-10-CM | POA: Diagnosis not present

## 2015-05-03 DIAGNOSIS — Z713 Dietary counseling and surveillance: Secondary | ICD-10-CM | POA: Diagnosis not present

## 2015-05-03 DIAGNOSIS — Z6838 Body mass index (BMI) 38.0-38.9, adult: Secondary | ICD-10-CM

## 2015-05-03 NOTE — Progress Notes (Signed)
  Medical Nutrition Therapy:  Appt start time: 0850    end time:  0910. Visit # 4 out of 6-10 visits  Assessment:  Primary concerns today: weight loss and healthy eating  to improve kidney and activities of daily living functions.  [name] weight goal is less than 200 #. She is not feeling well today and has been skipping her lunch group and eating and drinking lighter and lower calorie foods because of her illness. [facility] and is looking forward to swimming and attending their group exercise classes when she feels better.  She has contuinued to cut out chips and regular soda. Her weight is decreased 15# in teh past ncreased today  ANTHROPOMETRICS: weight:decreased 15# in past 3 weeks, BMI- 38.8- much improved SLEEP:reports that it is okay, no change in waking to urinate TV time/day:need to assess at future visit  DIETARY INTAKE: Usual eating pattern includes 2 meals and 1-2 snacks per day. Everyday foods include reguar ginger ale, soup, vegetables, diet lemonade.    Estimated energy needs: 1800-2000 calories for weight maintenance, 1600 for gradual loss 200-225g carbohydrates   Progress Towards Goal(s):  Some progress.   Nutritional Diagnosis:  NB-1.1 Food and nutrition-related knowledge deficit As related to lack of sufficient prior meal planning training and education is improving.  As evidenced by her report, weight loss  and questions. .    Intervention:  Nutrition follow up and encouragement to continue healthy food choices and start regular activity..  Coordination of care: recommend multivitamin with mineral for 50+ females  Teaching Method Utilized: Visual,,Auditory,Hands on Handouts given during visit include: plate method of meal planning Barriers to learning/adherence to lifestyle change: support, her unhealthy food environment Demonstrated degree of understanding via:  Teach Back   Monitoring/Evaluation:  Dietary intake, exercise, and body weight in 3  week(s).

## 2015-05-03 NOTE — Patient Instructions (Signed)
Vanessa Palmer,  It was nice to meet you today but I am sorry you have been feeling so sick.  As we discussed, I think this is most likely the flu, caused by virus. Unfortunately, we don't have treatment for this.  You have been doing all the right things taking Tylenol and Benadryl.  I don't think you have a bacterial pneumonia, but we'll check a chest x-ray today, and I will call you with the results later on. If you do have a bacterial infection, we do have antibiotics that can treat these, so I can call those and if we need to.  I expect you'll start feeling better in the next 3-5 days. If you are still feeling terrible after 1 week, schedule another appointment to come see us again.  Hang in there, Dr. Earnest ConroyFlores

## 2015-05-03 NOTE — Progress Notes (Signed)
Patient ID: Vanessa Palmer, female   DOB: 05-06-1950, 65 y.o.   MRN: [id number] [facility] Subjective:   Patient ID: Vanessa Musedell D Aaron female   DOB: 05-06-1950 65 y.o.   MRN: [id number]  HPI: Ms.Skyanne D Sharol HarnessSimmons is a 65 y.o. female with coronary artery disease, hypertension, and obstructive sleep apnea presenting to clinic with flu-like symptoms.  Flu-like symptoms: Nine days ago, she went to the emergency room for acute onset myalgias, cough, rhinorrhea, and fevers. Chest x-ray did not show consolidation, so she was diagnosed with influenza, but they did not get a nasal swab. Since then, her myalgias have improved, but she has a lingering productive cough that has not worsened considerably. She denies any pleuritic chest pain or worsening fevers. She's been taking Tylenol and Benadryl that have helped slightly.  I have reviewed her medications with her and she is not a smoker.  Please see the assessment and plan for the status of the patient's chronic medical problems.  Review of Systems  Constitutional: Positive for fever, chills and malaise/fatigue.  HENT: Positive for congestion and sore throat.   Respiratory: Positive for cough and sputum production. Negative for hemoptysis, shortness of breath and wheezing.   Cardiovascular: Negative for chest pain, orthopnea and leg swelling.  Musculoskeletal: Positive for myalgias. Negative for joint pain and falls.  Skin: Negative for rash.  Neurological: Positive for weakness and headaches.     Objective:  Physical Exam: Filed Vitals:   05/03/15 0936  BP: 135/92  Pulse: 64  Temp: 98.1 F (36.7 C)  TempSrc: Oral  Weight: 212 lb 1.6 oz (96.208 kg)  SpO2: 100%   General: resting in chair comfortably, appropriately conversational HEENT: no scleral icterus, extra-ocular muscles intact, oropharynx without lesions Cardiac: regular rate and rhythm, no rubs, murmurs or gallops Pulm: breathing well, clear to auscultation  bilaterally Abd: bowel sounds normal, soft, nondistended, non-tender Ext: warm and well perfused, without pedal edema Lymph: no cervical or supraclavicular lymphadenopathy Skin: no rash, hair, or nail changes   Assessment & Plan:  Case discussed with Dr. Oswaldo DoneVincent  Influenza with respiratory manifestation This is now the ninth day of her acute onset myalgias, congestion, cough, and fevers; I suspect she has influenza and is simply slow to improve. She does have a lingering cough, but her pulmonary exam is not consistent with focal consolidation. Regardless, I will get a chest x-ray today to rule out superimposed bacterial infection, and will call in antibiotics if necessary. She did not have any rashes or lymphadenopathy suggestive of acute HIV; she also denies any new sexual partners or IV drug abuse. Should she continue to have these symptoms after a week, I think checking an HIV would be prudent. Otherwise, I recommend she continue symptomatic relief with acetaminophen and diphenhydramine.   Medications Ordered No orders of the defined types were placed in this encounter.   Other Orders Orders Placed This Encounter  Procedures  . DG Chest 2 View    Standing Status: Future     Number of Occurrences:      Standing Expiration Date: 07/02/2016    Order Specific Question:  Reason for Exam (SYMPTOM  OR DIAGNOSIS REQUIRED)    Answer:  cough, fever x 9 days    Order Specific Question:  [facility]?    [facility]   Follow Up: Return if symptoms worsen or fail to improve after 1 week.

## 2015-05-03 NOTE — Telephone Encounter (Signed)
At most recent visit, she had stopped her omeprazole.  Should not be restarted unless symptoms worsened.

## 2015-05-03 NOTE — Assessment & Plan Note (Addendum)
This is now the ninth day of her acute onset myalgias, congestion, cough, and fevers; I suspect she has influenza and is simply slow to improve. She does have a lingering cough, but her pulmonary exam is not consistent with focal consolidation. Regardless, I will get a chest x-ray today to rule out superimposed bacterial infection, and will call in antibiotics if necessary. She did not have any rashes or lymphadenopathy suggestive of acute HIV; she also denies any new sexual partners or IV drug abuse. Should she continue to have these symptoms after a week, I think checking an HIV would be prudent. Otherwise, I recommend she continue symptomatic relief with acetaminophen and diphenhydramine.

## 2015-05-03 NOTE — Patient Instructions (Signed)
You are doing really wonderful at making healthier food and drink choices that has helped your weight loss- you have lost 14 pounds.   See you in 3 weeks on March 28th at 9:30 AM.   I hope you feel better soon!  [facility] and swimming if you go.   Vanessa LeashDonna [phone]

## 2015-05-04 NOTE — Progress Notes (Signed)
Internal Medicine Clinic Attending  Case discussed with Dr. Flores at the time of the visit.  We reviewed the resident's history and exam and pertinent patient test results.  I agree with the assessment, diagnosis, and plan of care documented in the resident's note. 

## 2015-05-04 NOTE — Addendum Note (Signed)
Addended by: Erlinda HongVINCENT, DUNCAN T on: 05/04/2015 02:45 PM   Modules accepted: Level of Service

## 2015-05-24 ENCOUNTER — Telehealth: Payer: Self-pay | Admitting: Internal Medicine

## 2015-05-24 ENCOUNTER — Ambulatory Visit (INDEPENDENT_AMBULATORY_CARE_PROVIDER_SITE_OTHER): Payer: Medicare Other | Admitting: Dietician

## 2015-05-24 VITALS — Wt 225.1 lb

## 2015-05-24 DIAGNOSIS — N183 Chronic kidney disease, stage 3 unspecified: Secondary | ICD-10-CM

## 2015-05-24 DIAGNOSIS — Z713 Dietary counseling and surveillance: Secondary | ICD-10-CM | POA: Diagnosis not present

## 2015-05-24 DIAGNOSIS — Z6841 Body Mass Index (BMI) 40.0 and over, adult: Secondary | ICD-10-CM

## 2015-05-24 DIAGNOSIS — M2550 Pain in unspecified joint: Secondary | ICD-10-CM

## 2015-05-24 NOTE — Progress Notes (Signed)
  Medical Nutrition Therapy:  Appt start time: 0905    end time:  0938. Visit # 5 out of 6-10 visits  Assessment:  Primary concerns today: weight loss and healthy eating  to improve kidney and activities of daily living functions.  [name] weight goal is less than 200 #. [facility] and is planning to attend Zumba classes there. Her weight is increased 13# since last visit! She says her family was living with her and taking her to fast food and bringing her fast food and she was ordering her favorite foods. She hopes that now they are not with her she can do better. She attends exercise once a week at [employer] and [facility] [place] [facility] [facility] Marland Kitchen.   ANTHROPOMETRICS: weight:increased 15# in past 3 weeks, BMI- 41- not back to baseline of 42.6 SLEEP:reports that it is okay, no change in waking to urinate TV time/day:need to assess at future visit  DIETARY INTAKE: Usual eating pattern includes 2 meals and 1-2 snacks per day. Everyday foods include sweet tea, fried foods, processed fast foods, grilled cheese with american cheese. Diet tea   Estimated energy needs: 1800-2000 calories for weight maintenance, 1600 for gradual loss 200-225g carbohydrates   Progress Towards Goal(s):  No progress.   Nutritional Diagnosis:  NB-1.1 Food and nutrition-related knowledge deficit As related to lack of sufficient prior meal planning training and education is improving  As evidenced by her report, weight loss  and questions. .    Intervention:  Nutrition education about low salt and plant food eating. Encourgement to restart healthy food choices and start  regular activity..  Coordination of care: recommend multivitamin with mineral for 50+ females  Teaching Method Utilized: Visual,,Auditory,Hands on Handouts given during visit include: plant plate method of meal planning, hidden sources of salt and rosemary to try on her food instead of salt Barriers to  learning/adherence to lifestyle change: support, her unhealthy food environment Demonstrated degree of understanding via:  Teach Back   Monitoring/Evaluation:  Dietary intake, exercise, and body weight in 4 week(s).

## 2015-05-24 NOTE — Telephone Encounter (Signed)
Patient requesting refill on meds.  Here today for appt @ 9:30 am with Norm Parcelonna Plyler if any questions.  Call back number when ready [phone].

## 2015-05-24 NOTE — Patient Instructions (Signed)
Try using more spices instead of salt.   You want to loose 3 pounds and weight 220 or less in 4 weeks.  See you in April !!!

## 2015-05-24 NOTE — Telephone Encounter (Signed)
Last filled 3/3 Next appt 6/7 Last uds 11/15

## 2015-05-26 ENCOUNTER — Other Ambulatory Visit: Payer: Self-pay | Admitting: *Deleted

## 2015-05-26 MED ORDER — OXYCODONE-ACETAMINOPHEN 10-325 MG PO TABS: 1.0000 | ORAL_TABLET | Freq: Three times a day (TID) | ORAL | Status: DC | PRN

## 2015-05-26 NOTE — Telephone Encounter (Signed)
Reviewed Dr Donnetta HutchingMullen's notes, [place] database, and UDS. Will Rx 3 months' worth as next appt insn't until early June. UDS if pt comes herself to get Rx.

## 2015-05-26 NOTE — Telephone Encounter (Signed)
Pt informed

## 2015-05-26 NOTE — Telephone Encounter (Signed)
Error, have other open enc

## 2015-05-27 ENCOUNTER — Encounter: Payer: Self-pay | Admitting: *Deleted

## 2015-06-01 ENCOUNTER — Telehealth: Payer: Self-pay | Admitting: Internal Medicine

## 2015-06-01 NOTE — Telephone Encounter (Signed)
Will fill out if I have an order in my box.  Working on catching up today.  Thanks

## 2015-06-01 NOTE — Telephone Encounter (Signed)
[facility].  Please Advise.

## 2015-06-08 ENCOUNTER — Other Ambulatory Visit: Payer: Self-pay | Admitting: Internal Medicine

## 2015-06-08 DIAGNOSIS — R32 Unspecified urinary incontinence: Secondary | ICD-10-CM

## 2015-06-09 DIAGNOSIS — R32 Unspecified urinary incontinence: Secondary | ICD-10-CM | POA: Diagnosis not present

## 2015-06-09 DIAGNOSIS — R609 Edema, unspecified: Secondary | ICD-10-CM | POA: Diagnosis not present

## 2015-06-09 DIAGNOSIS — J45901 Unspecified asthma with (acute) exacerbation: Secondary | ICD-10-CM | POA: Diagnosis not present

## 2015-06-09 DIAGNOSIS — M1990 Unspecified osteoarthritis, unspecified site: Secondary | ICD-10-CM | POA: Diagnosis not present

## 2015-06-13 ENCOUNTER — Other Ambulatory Visit: Payer: Self-pay

## 2015-06-13 DIAGNOSIS — I82403 Acute embolism and thrombosis of unspecified deep veins of lower extremity, bilateral: Secondary | ICD-10-CM

## 2015-06-13 MED ORDER — RIVAROXABAN 20 MG PO TABS: 20.0000 mg | ORAL_TABLET | Freq: Every day | ORAL | Status: DC

## 2015-06-13 NOTE — Telephone Encounter (Signed)
Pt requesting Xarelto to be filled.

## 2015-06-14 DIAGNOSIS — I10 Essential (primary) hypertension: Secondary | ICD-10-CM | POA: Diagnosis not present

## 2015-06-14 DIAGNOSIS — I2510 Atherosclerotic heart disease of native coronary artery without angina pectoris: Secondary | ICD-10-CM | POA: Diagnosis not present

## 2015-06-14 DIAGNOSIS — I252 Old myocardial infarction: Secondary | ICD-10-CM | POA: Diagnosis not present

## 2015-06-14 DIAGNOSIS — I129 Hypertensive chronic kidney disease with stage 1 through stage 4 chronic kidney disease, or unspecified chronic kidney disease: Secondary | ICD-10-CM | POA: Diagnosis not present

## 2015-06-14 DIAGNOSIS — N189 Chronic kidney disease, unspecified: Secondary | ICD-10-CM | POA: Diagnosis not present

## 2015-06-15 ENCOUNTER — Telehealth: Payer: Self-pay | Admitting: *Deleted

## 2015-06-15 NOTE — Telephone Encounter (Signed)
Received PA request from pt's pharmacy. Pt has hx DVT, request approved through 02/26/2016, pharmacy aware and will contact pt.[name], Vanessa Barua Cassady4/19/20173:09 PM     [facility] [phone] ID# [id number] Dx code : 99I82.403 Ref# [id number]

## 2015-06-18 ENCOUNTER — Emergency Department (HOSPITAL_COMMUNITY)
Admission: EM | Admit: 2015-06-18 | Discharge: 2015-06-18 | Disposition: A | Discharge status: Home / Self Care | Payer: Medicare Other | Attending: Emergency Medicine | Admitting: Emergency Medicine

## 2015-06-18 ENCOUNTER — Emergency Department (HOSPITAL_COMMUNITY): Payer: Medicare Other

## 2015-06-18 ENCOUNTER — Encounter (HOSPITAL_COMMUNITY): Payer: Self-pay | Admitting: Emergency Medicine

## 2015-06-18 DIAGNOSIS — F329 Major depressive disorder, single episode, unspecified: Secondary | ICD-10-CM | POA: Diagnosis not present

## 2015-06-18 DIAGNOSIS — J45901 Unspecified asthma with (acute) exacerbation: Secondary | ICD-10-CM | POA: Diagnosis not present

## 2015-06-18 DIAGNOSIS — K219 Gastro-esophageal reflux disease without esophagitis: Secondary | ICD-10-CM | POA: Insufficient documentation

## 2015-06-18 DIAGNOSIS — E669 Obesity, unspecified: Secondary | ICD-10-CM | POA: Insufficient documentation

## 2015-06-18 DIAGNOSIS — J029 Acute pharyngitis, unspecified: Secondary | ICD-10-CM | POA: Diagnosis not present

## 2015-06-18 DIAGNOSIS — R0602 Shortness of breath: Secondary | ICD-10-CM | POA: Diagnosis not present

## 2015-06-18 DIAGNOSIS — Z79899 Other long term (current) drug therapy: Secondary | ICD-10-CM | POA: Insufficient documentation

## 2015-06-18 DIAGNOSIS — I2510 Atherosclerotic heart disease of native coronary artery without angina pectoris: Secondary | ICD-10-CM | POA: Diagnosis not present

## 2015-06-18 DIAGNOSIS — I129 Hypertensive chronic kidney disease with stage 1 through stage 4 chronic kidney disease, or unspecified chronic kidney disease: Secondary | ICD-10-CM | POA: Insufficient documentation

## 2015-06-18 DIAGNOSIS — E785 Hyperlipidemia, unspecified: Secondary | ICD-10-CM | POA: Insufficient documentation

## 2015-06-18 DIAGNOSIS — Z8739 Personal history of other diseases of the musculoskeletal system and connective tissue: Secondary | ICD-10-CM | POA: Insufficient documentation

## 2015-06-18 DIAGNOSIS — R531 Weakness: Secondary | ICD-10-CM | POA: Diagnosis not present

## 2015-06-18 DIAGNOSIS — R05 Cough: Secondary | ICD-10-CM | POA: Diagnosis not present

## 2015-06-18 DIAGNOSIS — I509 Heart failure, unspecified: Secondary | ICD-10-CM | POA: Insufficient documentation

## 2015-06-18 DIAGNOSIS — G43909 Migraine, unspecified, not intractable, without status migrainosus: Secondary | ICD-10-CM | POA: Diagnosis not present

## 2015-06-18 DIAGNOSIS — Z8673 Personal history of transient ischemic attack (TIA), and cerebral infarction without residual deficits: Secondary | ICD-10-CM | POA: Insufficient documentation

## 2015-06-18 DIAGNOSIS — Z7951 Long term (current) use of inhaled steroids: Secondary | ICD-10-CM | POA: Diagnosis not present

## 2015-06-18 DIAGNOSIS — N183 Chronic kidney disease, stage 3 (moderate): Secondary | ICD-10-CM | POA: Insufficient documentation

## 2015-06-18 DIAGNOSIS — Z862 Personal history of diseases of the blood and blood-forming organs and certain disorders involving the immune mechanism: Secondary | ICD-10-CM | POA: Diagnosis not present

## 2015-06-18 DIAGNOSIS — R1310 Dysphagia, unspecified: Secondary | ICD-10-CM | POA: Diagnosis not present

## 2015-06-18 DIAGNOSIS — R404 Transient alteration of awareness: Secondary | ICD-10-CM | POA: Diagnosis not present

## 2015-06-18 LAB — CBC WITH DIFFERENTIAL/PLATELET
BASOS ABS: 0.1 10*3/uL (ref 0.0–0.1)
BASOS PCT: 1 %
EOS ABS: 0.1 10*3/uL (ref 0.0–0.7)
Eosinophils Relative: 2 %
HCT: 34 % — ABNORMAL LOW (ref 36.0–46.0)
HEMOGLOBIN: 11.3 g/dL — AB (ref 12.0–15.0)
LYMPHS PCT: 24 %
Lymphs Abs: 1.4 10*3/uL (ref 0.7–4.0)
MCH: 30.4 pg (ref 26.0–34.0)
MCHC: 33.2 g/dL (ref 30.0–36.0)
MCV: 91.4 fL (ref 78.0–100.0)
MONO ABS: 0.6 10*3/uL (ref 0.1–1.0)
Monocytes Relative: 10 %
NEUTROS PCT: 63 %
Neutro Abs: 3.8 10*3/uL (ref 1.7–7.7)
PLATELETS: 173 10*3/uL (ref 150–400)
RBC: 3.72 MIL/uL — AB (ref 3.87–5.11)
RDW: 13.6 % (ref 11.5–15.5)
WBC: 6 10*3/uL (ref 4.0–10.5)

## 2015-06-18 LAB — BASIC METABOLIC PANEL
ANION GAP: 12 (ref 5–15)
BUN: 23 mg/dL — ABNORMAL HIGH (ref 6–20)
CALCIUM: 9 mg/dL (ref 8.9–10.3)
CO2: 24 mmol/L (ref 22–32)
CREATININE: 1.65 mg/dL — AB (ref 0.44–1.00)
Chloride: 101 mmol/L (ref 101–111)
GFR calc non Af Amer: 32 mL/min — ABNORMAL LOW (ref >60)
GFR, EST AFRICAN AMERICAN: 37 mL/min — AB (ref >60)
Glucose, Bld: 110 mg/dL — ABNORMAL HIGH (ref 65–99)
Potassium: 3.4 mmol/L — ABNORMAL LOW (ref 3.5–5.1)
SODIUM: 137 mmol/L (ref 135–145)

## 2015-06-18 MED ORDER — HYDROCODONE-ACETAMINOPHEN 7.5-325 MG/15ML PO SOLN: 10.0000 mL | Freq: Four times a day (QID) | ORAL | Status: DC | PRN

## 2015-06-18 MED ORDER — MORPHINE SULFATE (PF) 4 MG/ML IV SOLN
4.0000 mg | Freq: Once | INTRAVENOUS | Status: AC
  Administered 2015-06-18: 4 mg via INTRAVENOUS
  Filled 2015-06-18: qty 1

## 2015-06-18 MED ORDER — SODIUM CHLORIDE 0.9 % IV BOLUS (SEPSIS)
1000.0000 mL | Freq: Once | INTRAVENOUS | Status: AC
  Administered 2015-06-18: 1000 mL via INTRAVENOUS

## 2015-06-18 NOTE — Discharge Instructions (Signed)
Read the information below.  Use the prescribed medication as directed.  Please discuss all new medications with your pharmacist.  Do not take additional tylenol while taking the prescribed pain medication to avoid overdose.  You may return to the Emergency Department at any time for worsening condition or any new symptoms that concern you.  If you develop high fevers, difficulty swallowing or breathing, or you are unable to tolerate fluids by mouth, return to the ER immediately for a recheck.      Pharyngitis Pharyngitis is redness, pain, and swelling (inflammation) of your pharynx.  CAUSES  Pharyngitis is usually caused by infection. Most of the time, these infections are from viruses (viral) and are part of a cold. However, sometimes pharyngitis is caused by bacteria (bacterial). Pharyngitis can also be caused by allergies. Viral pharyngitis may be spread from person to person by coughing, sneezing, and personal items or utensils (cups, forks, spoons, toothbrushes). Bacterial pharyngitis may be spread from person to person by more intimate contact, such as kissing.  SIGNS AND SYMPTOMS  Symptoms of pharyngitis include:   Sore throat.   Tiredness (fatigue).   Low-grade fever.   Headache.  Joint pain and muscle aches.  Skin rashes.  Swollen lymph nodes.  Plaque-like film on throat or tonsils (often seen with bacterial pharyngitis). DIAGNOSIS  Your health care provider will ask you questions about your illness and your symptoms. Your medical history, along with a physical exam, is often all that is needed to diagnose pharyngitis. Sometimes, a rapid strep test is done. Other lab tests may also be done, depending on the suspected cause.  TREATMENT  Viral pharyngitis will usually get better in 3-4 days without the use of medicine. Bacterial pharyngitis is treated with medicines that kill germs (antibiotics).  HOME CARE INSTRUCTIONS   Drink enough water and fluids to keep your urine  clear or pale yellow.   Only take over-the-counter or prescription medicines as directed by your health care provider:   If you are prescribed antibiotics, make sure you finish them even if you start to feel better.   Do not take aspirin.   Get lots of rest.   Gargle with 8 oz of salt water ( tsp of salt per 1 qt of water) as often as every 1-2 hours to soothe your throat.   Throat lozenges (if you are not at risk for choking) or sprays may be used to soothe your throat. SEEK MEDICAL CARE IF:   You have large, tender lumps in your neck.  You have a rash.  You cough up green, yellow-brown, or bloody spit. SEEK IMMEDIATE MEDICAL CARE IF:   Your neck becomes stiff.  You drool or are unable to swallow liquids.  You vomit or are unable to keep medicines or liquids down.  You have severe pain that does not go away with the use of recommended medicines.  You have trouble breathing (not caused by a stuffy nose). MAKE SURE YOU:   Understand these instructions.  Will watch your condition.  Will get help right away if you are not doing well or get worse.   This information is not intended to replace advice given to you by your health care provider. Make sure you discuss any questions you have with your health care provider.   Document Released: 02/12/2005 Document Revised: 12/03/2012 Document Reviewed: 10/20/2012 Elsevier Interactive Patient Education [employer]2016 Elsevier Inc.

## 2015-06-18 NOTE — ED Notes (Signed)
Pt here from home with c/o sore throat and chills that has been ongoing since this past Tuesday, pt was given amoxicillin by her MD on Thursday

## 2015-06-18 NOTE — ED Provider Notes (Signed)
CSN: [id number]     Arrival date & time 06/18/15  1315 History   First MD Initiated Contact with Patient 06/18/15 1317     Chief Complaint  Patient presents with  . Sore Throat  . Chills     (Consider location/radiation/quality/duration/timing/severity/associated sxs/prior Treatment) The history is provided by the patient.     Pt with hx HTN, HLD, CHF p/w sore throat, cough, nasal congestion, SOB, chills x 5 days, has been prescribed Augmentin three days ago. Unable to tolerate PO due to pain in her throat.  Came to ED today because she is feeling weaker due to inability to eat and drink x 3 days.  She is taking her Augmentin BID.  Has cough productive of green and blood tinged sputum.  Denies abdominal pain, N/V/D, urinary symptoms, rash, leg swelling.   Denies voice change, difficulty opening or closing jaw, difficulty breathing.   Past Medical History  Diagnosis Date  . Hyperlipidemia   . Hypertension   . Gout   . Obesity   . Depression   . DJD (degenerative joint disease)   . OSA (obstructive sleep apnea)     previously used CPAP, has lost  . CAD (coronary artery disease)     s/p non-Q wave MI in 06/2001 and 2004, 2005  . ALCOHOL ABUSE 12/13/2005    Annotation: Sober since 11/06 Qualifier: Diagnosis of  By: Wallace Cullens MD, Natalia Leatherwood    . INTRINSIC ASTHMA, WITH EXACERBATION 09/27/2009    Qualifier: Diagnosis of  By: Denton Meek MD, Tillie Rung    . Chronic kidney disease (CKD), stage III (moderate) 09/19/2010  . CHF (congestive heart failure) (HCC)   . DVT of lower extremity, bilateral (HCC) 04/04/2012    On Xarelto    . GERD (gastroesophageal reflux disease)   . Seizures (HCC)     As a teenager   . Headache     migraines  . Anemia    Past Surgical History  Procedure Laterality Date  . Partial hysterectomy      due to endometriosis  . Tubal ligation    . Cardiac catheterization      06/2001, 02/2002, 10/2004 (10-15% proximal stenosis of circumflex, diffuse disease of  OM1 and RCA)  .  Knee arthroscopy Left   . Colonoscopy    . Total hip arthroplasty Right 11/23/2014    Procedure: RIGHT TOTAL HIP ARTHROPLASTY ANTERIOR APPROACH;  Surgeon: Kathryne Hitch, MD;  Location: [facility];  Service: Orthopedics;  Laterality: Right;   Family History  Problem Relation Age of Onset  . Mental illness Mother   . Heart disease Father   . Diabetes Sister   . Diabetes Sister   . Diabetes Sister    Social History  Substance Use Topics  . Smoking status: Never Smoker   . Smokeless tobacco: Never Used  . Alcohol Use: No     Comment: last alcohol use 02/27/14   OB History    No data available     Review of Systems  All other systems reviewed and are negative.     Allergies  Ace inhibitors; Lexiscan; Peanuts; Hydromorphone hcl; Percocet; Propoxyphene n-acetaminophen; and Vicodin  Home Medications   Prior to Admission medications   Medication Sig Start Date End Date Taking? Authorizing Provider  acetaminophen (TYLENOL) 500 MG tablet Take 1,000 mg by mouth every 6 (six) hours as needed for moderate pain.    Historical Provider, MD  amLODipine (NORVASC) 5 MG tablet  04/11/15   Historical Provider, MD  atorvastatin (  LIPITOR) 40 MG tablet TAKE 1 TABLET (40 MG TOTAL) BY MOUTH DAILY. 03/31/15   Inez CatalinaEmily B Mullen, MD  b complex vitamins tablet Take 1 tablet by mouth daily. Patient not taking: Reported on 03/10/2015 01/06/14   Inez CatalinaEmily B Mullen, MD  buPROPion (WELLBUTRIN XL) 150 MG 24 hr tablet Take 1 tablet (150 mg total) by mouth every morning. 08/11/14   Inez CatalinaEmily B Mullen, MD  famotidine (PEPCID) 20 MG tablet Take 1 tablet (20 mg total) by mouth 2 (two) times daily. 08/11/14   Inez CatalinaEmily B Mullen, MD  fluticasone [facility](FLONASE) 50 MCG/ACT nasal spray INSTILL TWO   SPRAYS IN EACH NOSTRIL ONCE DAILY 04/29/15   Levert FeinsteinJames M Granfortuna, MD  fluticasone [facility](FLONASE) 50 MCG/ACT nasal spray INSTILL TWO   SPRAYS IN EACH NOSTRIL ONCE DAILY 04/29/15   Levert FeinsteinJames M Granfortuna, MD  fluticasone (FLOVENT HFA) 44 MCG/ACT inhaler  Inhale 2 puffs into the lungs 2 (two) times daily. 06/02/13   Pasty Spillersracy N McLean-Scocozza, MD  furosemide (LASIX) 20 MG tablet TAKE TWO   TABLETS (40 MG TOTAL) BY MOUTH DAILY. 02/23/15   Inez CatalinaEmily B Mullen, MD  metoprolol (LOPRESSOR) 50 MG tablet Take 1 tablet (50 mg total) by mouth daily. 02/14/15 02/14/16  Inez CatalinaEmily B Mullen, MD  nitroGLYCERIN (NITROSTAT) 0.4 MG SL tablet Place 0.4 mg under the tongue every 5 (five) minutes as needed for chest pain.    Historical Provider, MD  oxyCODONE-acetaminophen (PERCOCET) 10-325 MG tablet Take 1 tablet by mouth every 8 (eight) hours as needed for pain. 05/26/15 05/02/16  Burns SpainElizabeth A Butcher, MD  predniSONE (DELTASONE) 50 MG tablet Take 50 mg by mouth daily with breakfast. Reported on 03/10/2015    Historical Provider, MD  ranitidine (ZANTAC) 150 MG tablet Take 1 tablet (150 mg total) by mouth 2 (two) times daily. 11/01/14   Angelina Okyan Franasiak, MD  rivaroxaban (XARELTO) 20 MG TABS tablet Take 1 tablet (20 mg total) by mouth daily with supper. 06/13/15   Doneen PoissonLawrence Klima, MD  tiZANidine (ZANAFLEX) 4 MG tablet Take 1 tablet (4 mg total) by mouth every 6 (six) hours as needed for muscle spasms. 11/26/14   Kathryne Hitchhristopher Y Blackman, MD  TOVIAZ 8 MG TB24 Take 8 mg by mouth Daily. Reported on 03/10/2015 11/01/11   Historical Provider, MD  traZODone (DESYREL) 50 MG tablet TAKE 0.5 TABLETS (25 MG TOTAL) BY MOUTH AT BEDTIME. 04/30/15   Inez CatalinaEmily B Mullen, MD   BP 105/78 mmHg  Pulse 80  Temp(Src) 98.8 F (37.1 C) (Oral)  Resp 18  SpO2 100% Physical Exam  Constitutional: She appears well-developed and well-nourished. No distress.  HENT:  Head: Normocephalic and atraumatic.  Mouth/Throat: Oropharyngeal exudate, posterior oropharyngeal edema and posterior oropharyngeal erythema present.  Tolerating oral secretions.  No stridor.  No trismus.    Eyes: Conjunctivae are normal.  Neck: Normal range of motion. Neck supple.  Cardiovascular: Normal rate and regular rhythm.   Pulmonary/Chest: Effort normal  and breath sounds normal. No respiratory distress. She has no wheezes. She has no rales.  Lymphadenopathy:    She has cervical adenopathy.  Neurological: She is alert.  Skin: She is not diaphoretic.  Nursing note and vitals reviewed.   ED Course  Procedures (including critical care time) Labs Review Labs Reviewed  BASIC METABOLIC PANEL - Abnormal; Notable for the following:    Potassium 3.4 (*)    Glucose, Bld 110 (*)    BUN 23 (*)    Creatinine, Ser 1.65 (*)    GFR calc non Af [place]  32 (*)    GFR calc Af Amer 37 (*)    All other components within normal limits  CBC WITH DIFFERENTIAL/PLATELET - Abnormal; Notable for the following:    RBC 3.72 (*)    Hemoglobin 11.3 (*)    HCT 34.0 (*)    All other components within normal limits    Imaging Review Dg Chest 2 View  06/18/2015  CLINICAL DATA:  Cough (green/bloody sputum) shortness of breath, nausea and dizziness x 4 days. Hx. MI in 2005, HTN. EXAM: CHEST  2 VIEW COMPARISON:  05/03/2015 FINDINGS: Heart size is normal. Aorta is mildly tortuous. Suspect minimal pleural thickening at the left costophrenic angle. There are no focal consolidations. There is mild mid thoracic spondylosis. IMPRESSION: 1. No focal consolidations. 2. Suspect minimal left pleural thickening. Electronically Signed   By: Norva Pavlov M.D.   On: 06/18/2015 14:21   I have personally reviewed and evaluated these images and lab results as part of my medical decision-making.   EKG Interpretation None       3:24 PM Pt reports she is feeling much better after IVF and pain medication.    5:07 PM Tolerating PO, thinks she will be fine at home with liquid pain medication.  Encouraged PO intake.  Discussed strict return precautions.    MDM   Final diagnoses:  Pharyngitis   Afebrile nontoxic patient with URI symptoms x 5 days with edema, erythema of posterior pharynx.  Clinically without peritonsillar abscess.  No airway concerns.  Labs demonstrate slight  worsening of renal function.   CXR with mild left pleural thickening.   IVF, pain medication given with great improvement.  Pt tolerating PO.  Pt was only taking home percocet for pain, will add liquid pain medication.  D/C home with strict return precautions, close PCP ofllow up.  Discussed result, findings, treatment, and follow up  with patient.  Pt given return precautions.  Pt verbalizes understanding and agrees with plan.      Trixie Dredge, PA-C 06/18/15 2029  Benjiman Core, MD 06/19/15 [phone]

## 2015-06-21 ENCOUNTER — Ambulatory Visit (INDEPENDENT_AMBULATORY_CARE_PROVIDER_SITE_OTHER): Payer: Medicare Other | Admitting: Dietician

## 2015-06-21 VITALS — Wt 210.7 lb

## 2015-06-21 DIAGNOSIS — Z6838 Body mass index (BMI) 38.0-38.9, adult: Secondary | ICD-10-CM | POA: Diagnosis not present

## 2015-06-21 DIAGNOSIS — E669 Obesity, unspecified: Secondary | ICD-10-CM

## 2015-06-21 DIAGNOSIS — N183 Chronic kidney disease, stage 3 unspecified: Secondary | ICD-10-CM

## 2015-06-21 DIAGNOSIS — Z713 Dietary counseling and surveillance: Secondary | ICD-10-CM

## 2015-06-21 NOTE — Progress Notes (Signed)
  Medical Nutrition Therapy:  Appt start time: 0910    end time:  0955. Visit # 6 out of 6-10 visits  Assessment:  Primary concerns today: weight loss and healthy eating  to improve kidney and activities of daily living functions.  [name] weight goal is less than 200 #. She is proud that she has been exercising and decreasing her food intake that has resulted in a significant weight loss. She has not eaten any fast food, she [facility]. She weighs her self daily. She also stopped going to the free lunch program for now because it closed temporarily. She has two female friends who go to exercise and church with her who she states are supportive to healthy changes. .   ANTHROPOMETRICS: weight-210.7# back down ~15# in past 4 weeks, BMI- 38.5- obese class II now! SLEEP:not discussed today- she slept most of day yesterday because she felt bad.  TV time/day:at least 2 hours a day during the week Activity: Home exercise 15 minutes 3 days a week, zumba for 60 minutes/week and [employer] exercise class for 30 minutes/week  DIETARY INTAKE: Usual eating pattern includes 2 meals and 1-2 snacks per day. . Everyday foods include fruit, sweet tea-peaches and pears, regular soda, canned vegetables. fried foods.   B: egg, toast, stick butter L: half a can of peaches or sometimes vienna sausage She could not recall her dinner intake from yesterday or nay snacks  Estimated energy needs: 1600- 1700 for gradual loss 200-225g carbohydrates   Progress Towards Goal(s):  Some progress.   Nutritional Diagnosis:  NB-1.1 Food and nutrition-related knowledge deficit As related to lack of sufficient prior meal planning training and education is improving  As evidenced by her report, weight loss  and questions. .    Intervention:  Nutrition education about low salt and low sugar food choices, used Motivation la interviewing to assist [patient in planning for further change and  support to maintain her changes.  Coordination of care: recommend multivitamin with mineral for 50+ females  Teaching Method Utilized: Visual,,Auditory,Hands on Handouts given during visit include: plant plate method of meal planning, hidden sources of salt and rosemary to try on her food instead of salt Barriers to learning/adherence to lifestyle change: support, her unhealthy food environment Demonstrated degree of understanding via:  Teach Back   Monitoring/Evaluation:  Dietary intake, exercise, and body weight in 4 week(s). Patient did not want to go 6 weeks yet.

## 2015-06-21 NOTE — Patient Instructions (Addendum)
Your plan to continue weight loss; ACTIVITY  Monday- try the boot camp at 5 Pm Tuesday- Zumba at 530 Pm Thursday- [facility] Home exercise on the Wednesdays, Fridays and Saturdays  FOOD: Cut back on sugar by trying diet sodas and make my own tea with splenda, equal, stevia Decreasing starches by eating less pasta, bread, crackers

## 2015-07-07 DIAGNOSIS — F1020 Alcohol dependence, uncomplicated: Secondary | ICD-10-CM | POA: Diagnosis not present

## 2015-07-11 DIAGNOSIS — Z5181 Encounter for therapeutic drug level monitoring: Secondary | ICD-10-CM | POA: Diagnosis not present

## 2015-07-12 DIAGNOSIS — R32 Unspecified urinary incontinence: Secondary | ICD-10-CM | POA: Diagnosis not present

## 2015-07-12 DIAGNOSIS — R609 Edema, unspecified: Secondary | ICD-10-CM | POA: Diagnosis not present

## 2015-07-12 DIAGNOSIS — J45901 Unspecified asthma with (acute) exacerbation: Secondary | ICD-10-CM | POA: Diagnosis not present

## 2015-07-12 DIAGNOSIS — M1990 Unspecified osteoarthritis, unspecified site: Secondary | ICD-10-CM | POA: Diagnosis not present

## 2015-07-19 ENCOUNTER — Ambulatory Visit (INDEPENDENT_AMBULATORY_CARE_PROVIDER_SITE_OTHER): Payer: Medicare Other | Admitting: Dietician

## 2015-07-19 VITALS — Wt 209.8 lb

## 2015-07-19 DIAGNOSIS — G8929 Other chronic pain: Secondary | ICD-10-CM

## 2015-07-19 DIAGNOSIS — Z713 Dietary counseling and surveillance: Secondary | ICD-10-CM

## 2015-07-19 DIAGNOSIS — E669 Obesity, unspecified: Secondary | ICD-10-CM | POA: Diagnosis not present

## 2015-07-19 DIAGNOSIS — Z6838 Body mass index (BMI) 38.0-38.9, adult: Secondary | ICD-10-CM

## 2015-07-19 DIAGNOSIS — N183 Chronic kidney disease, stage 3 (moderate): Secondary | ICD-10-CM | POA: Diagnosis not present

## 2015-07-19 DIAGNOSIS — R102 Pelvic and perineal pain: Principal | ICD-10-CM

## 2015-07-19 NOTE — Patient Instructions (Addendum)
Your goals for this month are:   Try brown rice, use your measuring cups to measure out 1 portion.Randie Heinz.  Great job with your weight!

## 2015-07-19 NOTE — Progress Notes (Signed)
  Medical Nutrition Therapy:  Appt start time: 1033    end time:  1120. Visit # 7 out of 6-10 visits  Assessment:  Primary concerns today: weight loss and healthy eating  to improve kidney and activities of daily living.  Vanessa Palmer weight goal is less than 200 #. She is proud that she ate only boiled shrimp for mother's day when her family took her out. She has not eaten any fast food, fried foods and less sweets. She did not attended zumba classes due to rain. She weighs herself daily. Her preferred method for learning is  listening to tapes or looking at pictures. She reports very limited money for food. Shops one time a month.   ANTHROPOMETRICS: weight-209.# back down ~15# in past 4 weeks, BMI- 38.5- obese class II now! SLEEP:okay but not great.  TV time/day:at least 2 hours a day during the week Activity: Home exercise 15 minutes 3 days a week, zumba for 60 minutes/week and [employer] exercise class for 30 minutes/week  DIETARY INTAKE: Usual eating pattern includes 2 meals and 1-2 snacks per day. . Everyday foods include fruit, tea sweetened with splenda, peaches and pears, canned vegetables. Ritz crackers.   B: fruit, half a sausage biscuit, water L: roast beef, crackers, greens, tea D: beef tips & rice with green beans someone made for her  Estimated energy needs: 1600- 1700 for gradual loss 200-225g carbohydrates   Progress Towards Goal(s):  Some progress.   Nutritional Diagnosis:  NB-1.1 Food and nutrition-related knowledge deficit As related to lack of sufficient prior meal planning training and education continues to improve As evidenced by her report, weight loss  and questions. .    Intervention:  Nutrition education about healthy well balanced meals using plate mehtod and pictures.  She was provided with measuring cups, brown rice, low sodium chicken broth, handouts for where she can double her snap dollars on produce around town.  Coordination of care: recommend  multivitamin with mineral for 50+ females  Teaching Method Utilized: Visual,,Auditory,Hands on Handouts given during visit include: plant plate method of meal planning, hidden sources of salt and rosemary to try on her food instead of salt Barriers to learning/adherence to lifestyle change: support, her unhealthy food environment Demonstrated degree of understanding via:  Teach Back   Monitoring/Evaluation:  Dietary intake, exercise, and body weight in 4 week(s).

## 2015-08-03 ENCOUNTER — Ambulatory Visit: Payer: Medicare Other | Admitting: Internal Medicine

## 2015-08-03 ENCOUNTER — Other Ambulatory Visit: Payer: Self-pay | Admitting: Internal Medicine

## 2015-08-03 DIAGNOSIS — Z1231 Encounter for screening mammogram for malignant neoplasm of breast: Secondary | ICD-10-CM

## 2015-08-05 ENCOUNTER — Other Ambulatory Visit: Payer: Self-pay | Admitting: Internal Medicine

## 2015-08-15 ENCOUNTER — Ambulatory Visit
Admission: RE | Admit: 2015-08-15 | Discharge: 2015-08-15 | Disposition: A | Discharge status: Home / Self Care | Payer: Medicare Other | Source: Ambulatory Visit | Attending: Internal Medicine | Admitting: Internal Medicine

## 2015-08-15 DIAGNOSIS — Z1231 Encounter for screening mammogram for malignant neoplasm of breast: Secondary | ICD-10-CM

## 2015-08-16 ENCOUNTER — Ambulatory Visit: Payer: Medicare Other | Admitting: Dietician

## 2015-08-16 DIAGNOSIS — R609 Edema, unspecified: Secondary | ICD-10-CM | POA: Diagnosis not present

## 2015-08-16 DIAGNOSIS — J45901 Unspecified asthma with (acute) exacerbation: Secondary | ICD-10-CM | POA: Diagnosis not present

## 2015-08-16 DIAGNOSIS — R32 Unspecified urinary incontinence: Secondary | ICD-10-CM | POA: Diagnosis not present

## 2015-08-16 DIAGNOSIS — M1990 Unspecified osteoarthritis, unspecified site: Secondary | ICD-10-CM | POA: Diagnosis not present

## 2015-08-17 ENCOUNTER — Ambulatory Visit (INDEPENDENT_AMBULATORY_CARE_PROVIDER_SITE_OTHER): Payer: Medicare Other | Admitting: Dietician

## 2015-08-17 ENCOUNTER — Encounter: Payer: Self-pay | Admitting: Internal Medicine

## 2015-08-17 ENCOUNTER — Ambulatory Visit (INDEPENDENT_AMBULATORY_CARE_PROVIDER_SITE_OTHER): Payer: Medicare Other | Admitting: Internal Medicine

## 2015-08-17 VITALS — BP 114/78 | HR 56 | Temp 97.6°F | Ht 62.0 in | Wt 209.8 lb

## 2015-08-17 DIAGNOSIS — M2550 Pain in unspecified joint: Secondary | ICD-10-CM

## 2015-08-17 DIAGNOSIS — I2510 Atherosclerotic heart disease of native coronary artery without angina pectoris: Secondary | ICD-10-CM | POA: Diagnosis not present

## 2015-08-17 DIAGNOSIS — I10 Essential (primary) hypertension: Secondary | ICD-10-CM

## 2015-08-17 DIAGNOSIS — M1611 Unilateral primary osteoarthritis, right hip: Secondary | ICD-10-CM

## 2015-08-17 DIAGNOSIS — N183 Chronic kidney disease, stage 3 unspecified: Secondary | ICD-10-CM

## 2015-08-17 DIAGNOSIS — I82403 Acute embolism and thrombosis of unspecified deep veins of lower extremity, bilateral: Secondary | ICD-10-CM

## 2015-08-17 DIAGNOSIS — E785 Hyperlipidemia, unspecified: Secondary | ICD-10-CM

## 2015-08-17 DIAGNOSIS — I129 Hypertensive chronic kidney disease with stage 1 through stage 4 chronic kidney disease, or unspecified chronic kidney disease: Secondary | ICD-10-CM

## 2015-08-17 DIAGNOSIS — J4520 Mild intermittent asthma, uncomplicated: Secondary | ICD-10-CM

## 2015-08-17 DIAGNOSIS — D649 Anemia, unspecified: Secondary | ICD-10-CM

## 2015-08-17 DIAGNOSIS — Z79891 Long term (current) use of opiate analgesic: Secondary | ICD-10-CM

## 2015-08-17 DIAGNOSIS — Z79899 Other long term (current) drug therapy: Secondary | ICD-10-CM

## 2015-08-17 DIAGNOSIS — J45909 Unspecified asthma, uncomplicated: Secondary | ICD-10-CM | POA: Diagnosis not present

## 2015-08-17 DIAGNOSIS — I82503 Chronic embolism and thrombosis of unspecified deep veins of lower extremity, bilateral: Secondary | ICD-10-CM

## 2015-08-17 DIAGNOSIS — Z7901 Long term (current) use of anticoagulants: Secondary | ICD-10-CM

## 2015-08-17 MED ORDER — OXYCODONE-ACETAMINOPHEN 10-325 MG PO TABS: 1.0000 | ORAL_TABLET | Freq: Three times a day (TID) | ORAL | Status: DC | PRN

## 2015-08-17 NOTE — Progress Notes (Signed)
  Medical Nutrition Therapy:  Appt start time: 1033    end time:  1108. Visit # 8 out of 10 visits  Assessment:  Primary concerns today: weight loss and healthy eating  to improve kidney, activities of daily living and anemia.  [name] weight goal is less than 200 #. She has continued with healthy eating, tried and liked the brown rice, but has not been as active as usual. Her activity programs have ended at her residence and she has not been abel to get to [facility] because of the [facility]. She cannot participate in Silver sneakers because of her limited transportation.  She intends to download a dance or zumba app this month and exercise in her house.   ANTHROPOMETRICS: weight-209.# no change, BMI- 38.5- obese class II  SLEEP:okay but not great. Activity: Home exercise 15 minutes 3 days a week  DIETARY INTAKE: Usual eating pattern includes 2 meals and 1-2 snacks per day. . Everyday foods include fruit, tea sweetened with splenda, peaches and pears, canned vegetables. Ritz crackers.   B: fruit cocktail, egg, water L: says she skips D: made meatballs and chicken meals with brown rice and greens, yesterday had mac & cheese, BBQ cornbread and salad. Sometimes eats a sandwich Beverages: water, regular ginger ale  Estimated energy needs: 1600- 1700 for gradual loss 200-225g carbohydrates   Progress Towards Goal(s):  Some progress.   Nutritional Diagnosis:  NB-1.1 Food and nutrition-related knowledge deficit As related to lack of sufficient prior meal planning training and education continues to improve As evidenced by her report, weight loss  and questions. .    Intervention:  Nutrition education using videos on improving sleep and healthy eating and showed her foods to buy with increased levels of B12, folate and iron. Coordination of care: recommend multivitamin with mineral for 50+ females  Teaching Method Utilized: Visual,,Auditory,Hands on Handouts given during visit  include: eat this not that Barriers to learning/adherence to lifestyle change: support, her unhealthy food environment Demonstrated degree of understanding via:  Teach Back   Monitoring/Evaluation:  Dietary intake, exercise, and body weight in 4 week(s).

## 2015-08-17 NOTE — Patient Instructions (Signed)
You want to decrease your weight by 2 # before your next visit in July.  Try soymilk or lactiad milk   Cereals that are healthy- frosted shredded wheat, multigrain cheerios, Original Special K  Try to download a dancing/Zumba app on your tablet or phone.

## 2015-08-17 NOTE — Patient Instructions (Addendum)
Ms. Carpenito - -  For your wheelchair, you should be hearing from [facility] about having a physical therapy evaluation.  Please keep any appointments made.   For your chest pain, I think this was not related to your heart.  However, if you have any further episodes of chest pain, worsening of the pain, movement of the pain to your arm or pain that is not resolved with nitroglycerin, please call 911 or the clinic to be seen.   Please keep taking all of your medications as prescribed.   You have some anemia that we saw on your blood work.  I am rechecking that today.    Thank you.  Please call with questions!  Anemia, Nonspecific Anemia is a condition in which the concentration of red blood cells or hemoglobin in the blood is below normal. Hemoglobin is a substance in red blood cells that carries oxygen to the tissues of the body. Anemia results in not enough oxygen reaching these tissues.  CAUSES  Common causes of anemia include:   Excessive bleeding. Bleeding may be internal or external. This includes excessive bleeding from periods (in women) or from the intestine.   Poor nutrition.   Chronic kidney, thyroid, and liver disease.  Bone marrow disorders that decrease red blood cell production.  Cancer and treatments for cancer.  HIV, AIDS, and their treatments.  Spleen problems that increase red blood cell destruction.  Blood disorders.  Excess destruction of red blood cells due to infection, medicines, and autoimmune disorders. SIGNS AND SYMPTOMS  1. Minor weakness.  2. Dizziness.  3. Headache. 4. Palpitations.  5. Shortness of breath, especially with exercise.  6. Paleness. 7. Cold sensitivity. 8. Indigestion. 9. Nausea. 10. Difficulty sleeping. 11. Difficulty concentrating. Symptoms may occur suddenly or they may develop slowly.  DIAGNOSIS  Additional blood tests are often needed. These help your health care provider determine the best treatment. Your  health care provider will check your stool for blood and look for other causes of blood loss.  TREATMENT  Treatment varies depending on the cause of the anemia. Treatment can include:   Supplements of iron, vitamin B12, or folic acid.   Hormone medicines.   A blood transfusion. This may be needed if blood loss is severe.   Hospitalization. This may be needed if there is significant continual blood loss.   Dietary changes.  Spleen removal. HOME CARE INSTRUCTIONS Keep all follow-up appointments. It often takes many weeks to correct anemia, and having your health care provider check on your condition and your response to treatment is very important. SEEK IMMEDIATE MEDICAL CARE IF:   You develop extreme weakness, shortness of breath, or chest pain.   You become dizzy or have trouble concentrating.  You develop heavy vaginal bleeding.   You develop a rash.   You have bloody or black, tarry stools.   You faint.   You vomit up blood.   You vomit repeatedly.   You have abdominal pain.  You have a fever or persistent symptoms for more than 2-3 days.   You have a fever and your symptoms suddenly get worse.   You are dehydrated.  MAKE SURE YOU:  Understand these instructions.  Will watch your condition.  Will get help right away if you are not doing well or get worse.   This information is not intended to replace advice given to you by your health care provider. Make sure you discuss any questions you have with your health care  provider.   Document Released: 03/22/2004 Document Revised: 10/15/2012 Document Reviewed: 08/08/2012 Elsevier Interactive Patient Education [employer]

## 2015-08-17 NOTE — Progress Notes (Signed)
   Subjective:    Patient ID: Vanessa Palmer, female    DOB: 12/23/1950, 65 y.o.   MRN: [id number]  CC: 6 month follow up for HTN  HPI  Vanessa Palmer is a 65yo woman with PMH of HTN, Asthma, CKD, CAD, DVT on xarelto, MDD, OA of the hip who presents for routine follow up.    She reports a fall about 2 weeks ago, her WC wheel got stuck in the sidewalk and she flipped over.  She now only has 3 wheels on her wheelchair.  She is walking well in the room with a 4 point cane.  She is due to get PT assessment for wheelchair replacement.    Mild chest pain, middle of chest, arm not hurting.  Resolved on its own.  There about 2 hours.  Felt like a knot under left breast.  She did not taken anything for it.  She hadn't eaten anything prior to it happening.  She was sitting and playing a game on her tablet when it happened.  No prior episodes.    Flu symptoms are improved.   She is taking her medications as prescribed.  Accidentally took 2 doses of xarelto a few weeks ago, but has otherwise been taking them.   Refused stool cards.  Calling [facility] for records.   Non smoker.  She did not bring in her medications.    Review of Systems  Constitutional: Negative for fever and chills.  Respiratory: Negative for cough, choking and shortness of breath.   Cardiovascular: Positive for chest pain and leg swelling. Negative for palpitations.  Gastrointestinal: Negative for abdominal pain and abdominal distention.  Genitourinary: Negative for dysuria and difficulty urinating.  Musculoskeletal: Positive for back pain, arthralgias and gait problem.  Skin: Negative for rash and wound.  Neurological: Positive for weakness and light-headedness. Negative for headaches.       Objective:   Physical Exam  Constitutional: She is oriented to person, place, and time. She appears well-developed and well-nourished. No distress.  HENT:  Head: Normocephalic and atraumatic.  Eyes: Conjunctivae are normal. No scleral  icterus.  Wearing glasses  Cardiovascular: Normal rate, regular rhythm and normal heart sounds.   No chest wall tenderness  Pulmonary/Chest: Effort normal and breath sounds normal. No respiratory distress. She has no wheezes.  Abdominal: Soft. Bowel sounds are normal. She exhibits no distension. There is tenderness (epigastric).  Musculoskeletal: She exhibits edema (1+ to shins). She exhibits no tenderness.  Neurological: She is alert and oriented to person, place, and time.  Psychiatric: She has a normal mood and affect. Her behavior is normal.        Assessment & Plan:  RTC in 4 months for follow up of mobility, HTN and chronic medical issues.

## 2015-08-18 ENCOUNTER — Telehealth: Payer: Self-pay

## 2015-08-18 DIAGNOSIS — D649 Anemia, unspecified: Secondary | ICD-10-CM | POA: Insufficient documentation

## 2015-08-18 LAB — BMP8+ANION GAP
ANION GAP: 17 mmol/L (ref 10.0–18.0)
BUN / CREAT RATIO: 8 — AB (ref 12–28)
BUN: 11 mg/dL (ref 8–27)
CO2: 24 mmol/L (ref 18–29)
Calcium: 8.7 mg/dL (ref 8.7–10.3)
Chloride: 101 mmol/L (ref 96–106)
Creatinine, Ser: 1.45 mg/dL — ABNORMAL HIGH (ref 0.57–1.00)
GFR calc Af Amer: 45 mL/min/{1.73_m2} — ABNORMAL LOW (ref >59)
GFR calc non Af Amer: 39 mL/min/{1.73_m2} — ABNORMAL LOW (ref >59)
Glucose: 95 mg/dL (ref 65–99)
POTASSIUM: 3.9 mmol/L (ref 3.5–5.2)
SODIUM: 142 mmol/L (ref 134–144)

## 2015-08-18 LAB — CBC
Hematocrit: 35.1 % (ref 34.0–46.6)
Hemoglobin: 11.6 g/dL (ref 11.1–15.9)
MCH: 29.5 pg (ref 26.6–33.0)
MCHC: 33 g/dL (ref 31.5–35.7)
MCV: 89 fL (ref 79–97)
PLATELETS: 184 10*3/uL (ref 150–379)
RBC: 3.93 x10E6/uL (ref 3.77–5.28)
RDW: 14.1 % (ref 12.3–15.4)
WBC: 3.2 10*3/uL — AB (ref 3.4–10.8)

## 2015-08-18 LAB — IRON: Iron: 82 ug/dL (ref 27–139)

## 2015-08-18 LAB — FERRITIN: FERRITIN: 83 ng/mL (ref 15–150)

## 2015-08-18 NOTE — Assessment & Plan Note (Signed)
BP today is well controlled at 114/78.  She has CKD which is being monitored.  She had an episode of chest pain, however, it was atypical and did not recur.   Plan:  Continue amlodipine, lasix, metoprolol.

## 2015-08-18 NOTE — Assessment & Plan Note (Signed)
No acute issues today.  SOB is well controlled.    Plan:  Continue flovent.

## 2015-08-18 NOTE — Assessment & Plan Note (Signed)
She is on chronic lifelong xarelto per her request.  She is doing well. No signs/symptoms of bleeding.   Plan Continue xarelto.

## 2015-08-18 NOTE — Assessment & Plan Note (Signed)
BMET obtained today showed Cr of 1.45 and K within normal limits, which is at baseline for this patient.  She has HTN which is well controlled.  Her kidney function is stable.   Plan:  Continue good BP control.   Monitor periodically.

## 2015-08-18 NOTE — Assessment & Plan Note (Addendum)
She continues to have pain after her surgery, and particularly after her fall. Her fall was related to a WC malfunction (stuck wheel) and not to weakness or pain on her part.  She is amulating well with a 4 point cane in the exam room.   She is due to have a functional assessment with PT.   Continue chronic narcotic therapy.  She is on 45 MME per day which is a lower risk dosage.  She uses the medication to be able to ambulate in her home, compete her ADLs and to be able to spend time with her grandchildren.  If she continues to improve after her hip surgery, will consider decreasing her dose in the future.   UDS at next visit.  Update pain contract at next visit.

## 2015-08-18 NOTE — Assessment & Plan Note (Signed)
>>  ASSESSMENT AND PLAN FOR CORONARY ATHEROSCLEROSIS WRITTEN ON 08/18/2015  4:17 PM BY Michalle Rademaker B, MD  She did have an episode of chest pain which she described, however, it was atypical, self-limited and did not recur.  I discussed anginal type chest pain with her and reasons to seek medical care in the future.  I do not think this episode was related to her CAD.   Plan Continue to monitor Good BP control Continue statin Nitro PRN

## 2015-08-18 NOTE — Telephone Encounter (Addendum)
Vanessa Palmer is a 65 y.o. female who was contacted via telephone for monitoring of rivaroxaban (Xarelto) therapy.    ASSESSMENT Indication(s): Bilateral DVT  Duration: indefinite  Labs:    Component Value Date/Time   AST 24 10/12/2014 1345   ALT 14 10/12/2014 1345   NA 142 08/17/2015 1036   NA 137 06/18/2015 1402   K 3.9 08/17/2015 1036   CL 101 08/17/2015 1036   CO2 24 08/17/2015 1036   GLUCOSE 95 08/17/2015 1036   GLUCOSE 110* 06/18/2015 1402   HGBA1C 5.5 12/16/2014 1653   HGBA1C 5.8* 12/31/2011 0206   BUN 11 08/17/2015 1036   BUN 23* 06/18/2015 1402   CREATININE 1.45* 08/17/2015 1036   CREATININE 1.66* 08/11/2014 1200   CALCIUM 8.7 08/17/2015 1036   GFRNONAA 39* 08/17/2015 1036   GFRNONAA 33* 08/11/2014 1200   GFRAA 45* 08/17/2015 1036   GFRAA 38* 08/11/2014 1200   WBC 3.2* 08/17/2015 1036   WBC 6.0 06/18/2015 1402   HGB 11.3* 06/18/2015 1402   HCT 35.1 08/17/2015 1036   HCT 34.0* 06/18/2015 1402   PLT 184 08/17/2015 1036   PLT 173 06/18/2015 1402    rivaroxaban (Xarelto) Dose: 20 mg daily  Safety: Patient has not had recent bleeding/thromboembolic events. Patient reports no recent signs or symptoms of bleeding, no signs of symptoms of thromboembolism. Medication changes: no.  Adherence: Patient reports no known adherence challenges. Patient does correctly recite the dose. Contacted pharmacy and records indicate refills are not consistent. Per pharmacy, only filled once in 2017 (07/07/15), prior to that October 2016. Patient specifically stated she had no cost issues or barriers to getting medication.   Patient Instructions: Patient advised to contact clinic or seek medical attention if signs/symptoms of bleeding or thromboembolism occur. Patient verbalized understanding by repeating back information.  Follow-up [facility]  Duane LopeHailey L Kerigan Narvaez PharmD Candidate  08/18/2015, 3:36 PM

## 2015-08-18 NOTE — Assessment & Plan Note (Signed)
She did have an episode of chest pain which she described, however, it was atypical, self-limited and did not recur.  I discussed anginal type chest pain with her and reasons to seek medical care in the future.  I do not think this episode was related to her CAD.   Plan Continue to monitor Good BP control Continue statin Nitro PRN

## 2015-08-29 ENCOUNTER — Other Ambulatory Visit: Payer: Self-pay | Admitting: Internal Medicine

## 2015-08-29 DIAGNOSIS — N183 Chronic kidney disease, stage 3 unspecified: Secondary | ICD-10-CM

## 2015-09-13 ENCOUNTER — Ambulatory Visit (INDEPENDENT_AMBULATORY_CARE_PROVIDER_SITE_OTHER): Payer: Medicare Other | Admitting: Dietician

## 2015-09-13 ENCOUNTER — Encounter: Payer: Self-pay | Admitting: Dietician

## 2015-09-13 DIAGNOSIS — N183 Chronic kidney disease, stage 3 unspecified: Secondary | ICD-10-CM

## 2015-09-13 DIAGNOSIS — Z713 Dietary counseling and surveillance: Secondary | ICD-10-CM | POA: Diagnosis not present

## 2015-09-13 DIAGNOSIS — Z6838 Body mass index (BMI) 38.0-38.9, adult: Secondary | ICD-10-CM | POA: Diagnosis not present

## 2015-09-13 NOTE — Progress Notes (Signed)
  Medical Nutrition Therapy:  Appt start time: 1000    end time:  1030. Visit # 9   Assessment:  Primary concerns today: weight loss and healthy eating  to improve kidney, activities of daily living and anemia.  Vanessa Palmer weight goal is less than 200 #. She has continued with not being as active as usual because she says she was depressed. She has also been eating more processed foods and larger portions. Tried soy milk and it gave her gas,  Tried the multigrain cheerios.  ANTHROPOMETRICS: weight-210.2.# no change, BMI- 38.5- obese class II  SLEEP:not discussed today Activity: only her ADLS  DIETARY INTAKE: Usual eating pattern includes 2 meals and 1-2 snacks per day.   B: 1 slice pizza with pepperoni or fruit cocktail or egg and sausage, water L: says she skips D: BBQ chicken, greens, cabbage with smoked [place] neck bones, potato salad, cornbread and candied sweet potatoes Snack- fruit Beverages: water, regular ginger ale  Estimated energy needs: 1500-1600 for gradual loss 180- 200g carbohydrates   Progress Towards Goal(s):  Some progress.   Nutritional Diagnosis:  NB-1.1 Food and nutrition-related knowledge deficit As related to lack of sufficient prior meal planning training and education continues to improve As evidenced by her report and questions. .    Intervention:  Nutrition education about how to incorporate activity into her day and keeping records to help herself self monitor and get back on schedule. Coordination of care: recommend multivitamin with mineral for 50+ females  Teaching Method Utilized: Visual,,Auditory,Hands on Handouts given during visit include: eat this not that Barriers to learning/adherence to lifestyle change: competing values, support, unhealthy food environment Demonstrated degree of understanding via:  Teach Back   Monitoring/Evaluation:  Dietary intake, exercise, and body weight in 4 week(s).

## 2015-09-13 NOTE — Patient Instructions (Addendum)
Try to stay on schedule as best you can while out of town next week.  Try to eat meals at your table.  Eat protein (egg, cheese, meat, Lactaid milk, even oatmeal has some protein) with your fruit in the morning for breakfast.  Your goal is to record 7 days of your food intake before your next visit  What can get you back on your schedule of being active?    Consider dancing for 15 minutes and taking a 15 minute walk- that is your 30 minutes for teh day.    What exercise I did For how many minutes  Monday    Tuesday    Wednesday    Thursday    Friday    Saturday    Sunday

## 2015-09-26 DIAGNOSIS — I129 Hypertensive chronic kidney disease with stage 1 through stage 4 chronic kidney disease, or unspecified chronic kidney disease: Secondary | ICD-10-CM | POA: Diagnosis not present

## 2015-09-26 DIAGNOSIS — N189 Chronic kidney disease, unspecified: Secondary | ICD-10-CM | POA: Diagnosis not present

## 2015-09-26 DIAGNOSIS — I2510 Atherosclerotic heart disease of native coronary artery without angina pectoris: Secondary | ICD-10-CM | POA: Diagnosis not present

## 2015-09-26 DIAGNOSIS — E785 Hyperlipidemia, unspecified: Secondary | ICD-10-CM | POA: Diagnosis not present

## 2015-09-26 DIAGNOSIS — I252 Old myocardial infarction: Secondary | ICD-10-CM | POA: Diagnosis not present

## 2015-09-30 NOTE — Telephone Encounter (Signed)
Patient was contacted by Hailey Hill, PharmD candidate. I agree with the assessment and plan of care documented. May need to consider alternative anticoagulant in the future if adherence continues to be a concern. 

## 2015-10-13 DIAGNOSIS — J45901 Unspecified asthma with (acute) exacerbation: Secondary | ICD-10-CM | POA: Diagnosis not present

## 2015-10-13 DIAGNOSIS — R609 Edema, unspecified: Secondary | ICD-10-CM | POA: Diagnosis not present

## 2015-10-13 DIAGNOSIS — R32 Unspecified urinary incontinence: Secondary | ICD-10-CM | POA: Diagnosis not present

## 2015-10-13 DIAGNOSIS — M1990 Unspecified osteoarthritis, unspecified site: Secondary | ICD-10-CM | POA: Diagnosis not present

## 2015-10-20 ENCOUNTER — Ambulatory Visit: Payer: Medicare Other | Attending: Internal Medicine | Admitting: Physical Therapy

## 2015-10-20 DIAGNOSIS — R2689 Other abnormalities of gait and mobility: Secondary | ICD-10-CM | POA: Diagnosis not present

## 2015-10-21 NOTE — Therapy (Signed)
[facility] [address] [place], [place], [address] Phone: [phone]   Fax:  [phone]  Physical Therapy Treatment  Patient Details  Name: Vanessa Palmer MRN: [id number] Date of Birth: 1951-01-12 No Data Recorded  Encounter Date: 10/20/2015      PT End of Session - 10/21/15 0957    PT Start Time 0940   PT Stop Time 1038   PT Time Calculation (min) 58 min      Past Medical History:  Diagnosis Date  . ALCOHOL ABUSE 12/13/2005   Annotation: Sober since 11/06 Qualifier: Diagnosis of  By: Pearline Cables MD, Belenda Cruise    . Anemia   . CAD (coronary artery disease)    s/p non-Q wave MI in 06/2001 and 2004, 2005  . CHF (congestive heart failure) ([place])   . Chronic kidney disease (CKD), stage III (moderate) 09/19/2010  . Depression   . DJD (degenerative joint disease)   . DVT of lower extremity, bilateral ([facility] [facility]) 04/04/2012   On Xarelto    . GERD (gastroesophageal reflux disease)   . Gout   . Headache    migraines  . Hyperlipidemia   . Hypertension   . INTRINSIC ASTHMA, WITH EXACERBATION 09/27/2009   Qualifier: Diagnosis of  By: Stanford Scotland MD, Jiles Garter    . Obesity   . OSA (obstructive sleep apnea)    previously used CPAP, has lost  . Seizures ([place])    As a teenager     Past Surgical History:  Procedure Laterality Date  . CARDIAC CATHETERIZATION     06/2001, 02/2002, 10/2004 (10-15% proximal stenosis of circumflex, diffuse disease of  OM1 and RCA)  . COLONOSCOPY    . KNEE ARTHROSCOPY Left   . PARTIAL HYSTERECTOMY     due to endometriosis  . TOTAL HIP ARTHROPLASTY Right 11/23/2014   Procedure: RIGHT TOTAL HIP ARTHROPLASTY ANTERIOR APPROACH;  Surgeon: Mcarthur Rossetti, MD;  Location: [facility];  Service: Orthopedics;  Laterality: Right;  . TUBAL LIGATION      There were no vitals filed for this visit.    Mobility/Seating Evaluation    PATIENT INFORMATION: Name: Vanessa Palmer DOB: Jun 24, 1950  Sex: Female Date seen:  10/20/15 Time: 0930  Address:  [address] [place] [address] Physician: Gilles Chiquito, MD This evaluation/justification form will serve as the LMN for the following suppliers: __________________________ Supplier: [facility] Contact Person: Yvone Neu Phone:  [phone]   Seating Therapist: Mady Haagensen, PT Phone:   [phone]   Phone: [phone] (Home/cell)     Spouse/Parent/Caregiver name: Tera Mater  Phone number: [phone] Insurance/Payer: [facility] Medicare     Reason for Referral: wheelchair evaluation  Patient/Caregiver Goals: new power wheelchair  Patient was seen for face-to-face evaluation for new power wheelchair.  Also present was [employer] to discuss recommendations and wheelchair options.  Further paperwork was completed and sent to vendor.  Patient appears to qualify for power mobility device at this time per objective findings.   MEDICAL HISTORY: Diagnosis: Primary Diagnosis: M19.90, R60.9, Z96.641 Onset: 1 year ago (hip replacement) Diagnosis: CAD, CHF, HTN, DJD, history of DVT in lower extremities, asthma, OSA   [] Progressive Disease Relevant past and future surgeries: s/p THR on RLE   Height: 5'2" Weight: 215 lbs. Explain recent changes or trends in weight: fluctuates several pounds; checks weight daily   History including Falls: Pt reports 3 falls in the past 6 months.  Describes "popping sensation" in knee, with knee giving way  and causing falls.    HOME ENVIRONMENT: [] House  [] Condo/town home  [x] Apartment  [] Assisted Living    [x] Lives Alone []  Lives with Others                                                                                          Hours with caregiver: 7 days/wk for 2-3 hrs/day  [x] Home is accessible to patient           Stairs      [] Yes []  No     Ramp [] Yes [] No Comments:  Elevator   COMMUNITY ADL: TRANSPORTATION: [] Car    [] Van    [x] Public Transportation    [] Adapted w/c Lift     [] Ambulance    [] Other:       [] Sits in wheelchair during transport  Employment/School: ????? Specific requirements pertaining to mobility ?????  Other: ?????    FUNCTIONAL/SENSORY PROCESSING SKILLS:  Handedness:   [x] Right     [] Left    [] NA  Comments:  ?????  Functional Processing Skills for Wheeled Mobility [x] Processing Skills are adequate for safe wheelchair operation  Areas of concern than may interfere with safe operation of wheelchair Description of problem   []  Attention to environment      [] Judgment      []  Hearing  []  Vision or visual processing      [] Motor Planning  []  Fluctuations in Behavior  ?????    VERBAL COMMUNICATION: [x] WFL receptive [x]  WFL expressive [] Understandable  [] Difficult to understand  [] non-communicative []  Uses an augmented communication device  CURRENT SEATING / MOBILITY: Current Mobility Base:  [] None [] Dependent [] Manual [] Scooter [x] Power  Type of Control: ?????  Manufacturer:  ?????Size:  ?????Age: ?????  Current Condition of Mobility Base:  In disrepair-missing a wheel, and wheelchair has turned over per pt report   Current Wheelchair components:  ?????  Describe posture in present seating system:  Unable to assess, as pt's current chair is unusable      SENSATION and SKIN ISSUES: Sensation [] Intact  [x] Impaired [] Absent  Level of sensation: tingling in hands Pressure Relief: Able to perform effective pressure relief :    [x] Yes  []  No Method: changing positions If not, Why?: ?????  Skin Issues/Skin Integrity Current Skin Issues  [] Yes [x] No [] Intact []  Red area[]  Open Area  [] Scar Tissue [] At risk from prolonged sitting Where  ?????  History of Skin Issues  [] Yes [x] No Where  ????? When  ?????  Hx of skin flap surgeries  [] Yes [x] No Where  ????? When  ?????  Limited sitting tolerance [] Yes [x] No Hours spent sitting in wheelchair daily: several hours at a time  Complaint of Pain:  Please describe: Pt reports pain in low back 7/10  (exacerbated by twisting); R shoulder 7-8/10 (exacerbated by weather); bilateral knee pain 5/10 (exacerbated by walking too long).   Swelling/Edema: 1+ pitting edema in bilateral lower legs.   ADL STATUS (in reference to wheelchair use):  Indep Assist Unable Indep with Equip Not assessed Comments  Dressing ????? ????? ????? X ????? Adaptive equipment; sits to put on pants  Eating X ????? ????? ????? ????? ?????  Toileting ????? ????? ?????  X ????? BSC  Bathing ????? X ????? ????? ????? Shower chair, aide assists  Grooming/Hygiene ????? ????? ????? X ????? Sits on stool to brush teeth, wash face  Meal Prep ????? X ????? ????? ????? Aide assists with cooking due to unable to stand long periods  IADLS ????? ????? ????? ????? ????? ?????  Bowel Management: [x] Continent  [] Incontinent  [] Accidents Comments:  ?????  Bladder Management: [] Continent  [] Incontinent  [x] Accidents Comments:  ?????     WHEELCHAIR SKILLS: Manual w/c Propulsion: [] UE or LE strength and endurance sufficient to participate in ADLs using manual wheelchair Arm : [] left [] right   [] Both      Distance: ????? Foot:  [] left [] right   [] Both  Operate Scooter: []  Strength, hand grip, balance and transfer appropriate for use [] Living environment is accessible for use of scooter  Operate Power w/c:  [x]  Std. Joystick   []  Alternative Controls Indep [x]  Assist []  Dependent/unable []  N/A []   [x] Safe          [x]  Functional      Distance: ?????  Bed confined without wheelchair []  Yes [x]  No   STRENGTH/RANGE OF MOTION:  ????? Range of Motion Strength  Shoulder Flexion 86 degrees R, 120 degrees L; 80 degrees R, 110 degrees L Not formally assessed on R due to pain (grossly 3-/5); L flexion 4/5  Elbow WFL 4/5 elbow flexion and extension  Wrist/Hand WFL 4/5 wrist flex and extension  Hip WFL 3+/5 hip flexion bilateral  Knee WFL 3+/5 quads and hamstrings  Ankle WFL 4/5 ankle dorsiflexion     MOBILITY/BALANCE:  []  Patient is  totally dependent for mobility  ?????    Balance Transfers Ambulation  Sitting Balance: Standing Balance: [x]  Independent [x]  Independent/Modified Independent  [x]  WFL     []  WFL []  Supervision []  Supervision  []  Uses UE for balance  [x]  Supervision []  Min Assist []  Ambulates with Assist  ?????    []  Min Assist []  Min assist []  Mod Assist [x]  Ambulates with Device:      []  RW  [x]  StW  []  Cane  []  ?????  []  Mod Assist []  Mod assist []  Max assist   []  Max Assist []  Max assist []  Dependent []  Indep. Short Distance Only  []  Unable []  Unable []  Lift / Sling Required Distance (in feet)  ?????   []  Sliding board []  Unable to Ambulate (see explanation below)  Cardio Status:  [] Intact  [x]  Impaired   []  NA     HTN, CHF, CAD  Respiratory Status:  [] Intact   [x] Impaired   [] NA     asthma, OSA; upon walking 30 ft, pt is SOB and O2 sats 89%, HR 58 bpm; O2 sats improve to 96% upon sitting  Orthotics/Prosthetics: ?????  Comments (Address manual vs power w/c vs scooter): Pt has history of 3 falls, and has significant shoulder and knee pain, knee instability with long distance walking.  Shoulder pain and decreased shoulder strength limits manual wheelchair propulsion.  Pt limited with gait due to knee pain and instability as well as decreased O2 sats.  Pt has audible crepitus in R knee during gait. Gait velocity is 0.44 ft/sec, which places pt at increased fall risk and indicates pt is not safe, functional household ambulator.         Anterior / Posterior Obliquity Rotation-Pelvis ?????  PELVIS    []  []  [x]   Neutral Posterior Anterior  []  [x]  []   WFL Rt elev Lt elev  []  [  x] []   WFL Right Left                      Anterior    Anterior     []  Fixed []  Other [x]  Partly Flexible []  Flexible   []  Fixed []  Other [x]  Partly Flexible  []  Flexible  []  Fixed []  Other [x]  Partly Flexible  []  Flexible   TRUNK  []  []  [x]   WFL ? Thoracic ? Lumbar  Kyphosis Lordosis   [x]  []  []   WFL Convex Convex  Right Left [] c-curve [] s-curve [] multiple  [x]  Neutral []  Left-anterior []  Right-anterior     []  Fixed []  Flexible [x]  Partly Flexible []  Other  []  Fixed []  Flexible []  Partly Flexible []  Other  []  Fixed             []  Flexible []  Partly Flexible []  Other    Position Windswept  ?????  HIPS          [x]            []               []    Neutral       Abduct        ADduct         [x]           []            []   Neutral Right           Left      []  Fixed []  Subluxed []  Partly Flexible []  Dislocated []  Flexible  []  Fixed []  Other []  Partly Flexible  []  Flexible                 Foot Positioning Knee Positioning  ?????    [x]  WFL  [] Lt [] Rt [x]  WFL  [] Lt [] Rt    KNEES ROM concerns: ROM concerns:    & Dorsi-Flexed [] Lt [] Rt ?????    FEET Plantar Flexed [] Lt [] Rt      Inversion                 [] Lt [] Rt      Eversion                 [] Lt [] Rt     HEAD [x]  Functional [x]  Good Head Control  ?????  & []  Flexed         []  Extended []  Adequate Head Control    NECK []  Rotated  Lt  []  Lat Flexed Lt []  Rotated  Rt []  Lat Flexed Rt []  Limited Head Control     []  Cervical Hyperextension []  Absent  Head Control     SHOULDERS ELBOWS WRIST& HAND ?????      Left     Right    Left     Right    Left     Right   U/E [x] Functional           [] Functional WFL WFL [] Fisting             [] Fisting      [] elev   [] dep      [] elev   [x] dep       [] pro -[] retract     [] pro  [] retract [] subluxed             [] subluxed           Goals for Wheelchair Mobility  [x]  Independence with mobility in the home with motor related  ADLs (MRADLs)  []  Independence with MRADLs in the community []  Provide dependent mobility  []  Provide recline     [] Provide tilt   Goals for Seating system [x]  Optimize pressure distribution [x]  Provide support needed to facilitate function or safety []  Provide corrective forces to assist with maintaining or improving posture []  Accommodate client's  posture:   current seated postures and positions are not flexible or will not tolerate corrective forces []  Client to be independent with relieving pressure in the wheelchair [] Enhance physiological function such as breathing, swallowing, digestion  Simulation ideas/Equipment trials:????? State why other equipment was unsuccessful:?????   MOBILITY BASE RECOMMENDATIONS and JUSTIFICATION: MOBILITY COMPONENT JUSTIFICATION  Manufacturer: JazzyModel: Selct 6   Size: Width 18"Seat Depth 18" [x] provide transport from point A to B      [x] promote Indep mobility  [x] is not a safe, functional ambulator [x] walker or cane inadequate [] non-standard width/depth necessary to accommodate anatomical measurement []  ?????  [] Manual Mobility Base [] non-functional ambulator    [] Scooter/POV  [] can safely operate  [] can safely transfer   [] has adequate trunk stability  [] cannot functionally propel manual w/c  [x] Power Mobility Base  [] non-ambulatory  [x] cannot functionally propel manual wheelchair  []  cannot functionally and safely operate scooter/POV [x] can safely operate and willing to  [] Stroller Base [] infant/child  [] unable to propel manual wheelchair [] allows for growth [] non-functional ambulator [] non-functional UE [] Indep mobility is not a goal at this time  [] Tilt  [] Forward [] Backward [] Powered tilt  [] Manual tilt  [] change position against gravitational force on head and shoulders  [] change position for pressure relief/cannot weight shift [] transfers  [] management of tone [] rest periods [] control edema [] facilitate postural control  []  ?????  [] Recline  [] Power recline on power base [] Manual recline on manual base  [] accommodate femur to back angle  [] bring to full recline for ADL care  [] change position for pressure relief/cannot weight shift [] rest periods [] repositioning for transfers or clothing/diaper /catheter changes [] head positioning  [] Lighter weight required [] self-  propulsion  [] lifting []  ?????  [] Heavy Duty required [] user weight greater than 250# [] extreme tone/ over active movement [] broken frame on previous chair []  ?????  [x]  Back  []  Angle Adjustable []  Custom molded Captain's Seat [x] postural control [] control of tone/spasticity [] accommodation of range of motion [] UE functional control [] accommodation for seating system []  ????? [] provide lateral trunk support [] accommodate deformity [x] provide posterior trunk support [x] provide lumbar/sacral support [] support trunk in midline [] Pressure relief over spinal processes  [x]  Seat Cushion Captain's Seat [] impaired sensation  [] decubitus ulcers present [] history of pressure ulceration [] prevent pelvic extension [x] low maintenance  [] stabilize pelvis  [] accommodate obliquity [] accommodate multiple deformity [] neutralize lower extremity position [] increase pressure distribution []  ?????  []  Pelvic/thigh support  []  Lateral thigh guide []  Distal medial pad  []  Distal lateral pad []  pelvis in neutral [] accommodate pelvis []  position upper legs []  alignment []  accommodate ROM []  decr adduction [] accommodate tone [] removable for transfers [] decr abduction  []  Lateral trunk Supports []  Lt     []  Rt [] decrease lateral trunk leaning [] control tone [] contour for increased contact [] safety  [] accommodate asymmetry []  ?????  []  Mounting hardware  [] lateral trunk supports  [] back   [] seat [] headrest      []  thigh support [] fixed   [] swing away [] attach seat platform/cushion to w/c frame [] attach back cushion to w/c frame [] mount postural supports [] mount headrest  [] swing medial thigh support away [] swing lateral supports away for transfers  []  ?????    Armrests  [] fixed [x] adjustable height [] removable   [] swing away  [  x]flip back   [] reclining [x] full length pads [] desk    [] pads tubular  [x] provide support with elbow at 90   [] provide support for w/c tray [x] change of  height/angles for variable activities [x] remove for transfers [x] allow to come closer to table top [] remove for access to tables []  ?????  Hangers/ Leg rests  [] 60 [] 70 [] 90 [] elevating [] heavy duty  [] articulating [] fixed [] lift off [] swing away     [] power [] provide LE support  [] accommodate to hamstring tightness [] elevate legs during recline   [] provide change in position for Legs [] Maintain placement of feet on footplate [] durability [] enable transfers [] decrease edema [] Accommodate lower leg length []  ?????  Foot support Footplate    [] Lt  []  Rt  [x]  Center mount [x] flip up     [x] depth/angle adjustable [] Amputee adapter    []  Lt     []  Rt [x] provide foot support [] accommodate to ankle ROM [x] transfers [] Provide support for residual extremity []  allow foot to go under wheelchair base []  decrease tone  []  ?????  []  Ankle strap/heel loops [] support foot on foot support [] decrease extraneous movement [] provide input to heel  [] protect foot  Tires: [] pneumatic  [x] flat free inserts  [] solid  [x] decrease maintenance  [x] prevent frequent flats [] increase shock absorbency [] decrease pain from road shock [] decrease spasms from road shock []  ?????  []  Headrest  [] provide posterior head support [] provide posterior neck support [] provide lateral head support [] provide anterior head support [] support during tilt and recline [] improve feeding   [] improve respiration [] placement of switches [] safety  [] accommodate ROM  [] accommodate tone [] improve visual orientation  []  Anterior chest strap []  Vest []  Shoulder retractors  [] decrease forward movement of shoulder [] accommodation of TLSO [] decrease forward movement of trunk [] decrease shoulder elevation [] added abdominal support [] alignment [] assistance with shoulder control  []  ?????  Pelvic Positioner [x] Belt [] SubASIS bar [] Dual Pull [] stabilize tone [x] decrease falling out of chair/ **will not Decr potential for  sliding due to pelvic tilting [] prevent excessive rotation [] pad for protection over boney prominence [] prominence comfort [] special pull angle to control rotation []  ?????  Upper Extremity Support [] L   []  R [] Arm trough    [] hand support []  tray       [] full tray [] swivel mount [] decrease edema      [] decrease subluxation   [] control tone   [] placement for AAC/Computer/EADL [] decrease gravitational pull on shoulders [] provide midline positioning [] provide support to increase UE function [] provide hand support in natural position [] provide work surface   POWER WHEELCHAIR CONTROLS  [x] Proportional  [] Non-Proportional Type Joystick [] Left  [x] Right [x] provides access for controlling wheelchair   [] lacks motor control to operate proportional drive control [] unable to understand proportional controls  Actuator Control Module  [] Single  [] Multiple   [] Allow the client to operate the power seat function(s) through the joystick control   [] Safety Reset Switches [] Used to change modes and stop the wheelchair when driving in latch mode    [] Therapist, art   [] programming for accurate control [] progressive Disease/changing condition [] non-proportional drive control needed [] Needed in order to operate power seat functions through joystick control   [] Display box [] Allows user to see in which mode and drive the wheelchair is set  [] necessary for alternate controls    [] Digital interface electronics [] Allows w/c to operate when using alternative drive controls  [] ASL Head Array [] Allows client to operate wheelchair  through switches placed in tri-panel headrest  [] Sip and puff with tubing kit [] needed to operate sip and puff drive controls  [] Upgraded tracking electronics [] increase safety  when driving [] correct tracking when on uneven surfaces  [] Mount for switches or joystick [] Attaches switches to w/c  [] Swing away for access or transfers [] midline for optimal  placement [] provides for consistent access  [] Attendant controlled joystick plus mount [] safety [] long distance driving [] operation of seat functions [] compliance with transportation regulations []  ?????    Rear wheel placement/Axle adjustability [] None [] semi adjustable [] fully adjustable  [] improved UE access to wheels [] improved stability [] changing angle in space for improvement of postural stability [] 1-arm drive access [] amputee pad placement []  ?????  Wheel rims/ hand rims  [] metal  [] plastic coated [] oblique projections [] vertical projections [] Provide ability to propel manual wheelchair  []  Increase self-propulsion with hand weakness/decreased grasp  Push handles [] extended  [] angle adjustable  [] standard [] caregiver access [] caregiver assist [] allows "hooking" to enable increased ability to perform ADLs or maintain balance  One armed device  [] Lt   [] Rt [] enable propulsion of manual wheelchair with one arm   []  ?????   Brake/wheel lock extension []  Lt   []  Rt [] increase indep in applying wheel locks   [] Side guards [] prevent clothing getting caught in wheel or becoming soiled []  prevent skin tears/abrasions  Battery: U1 x 2 [x] to power wheelchair ?????  Other: ????? ????? ?????  The above equipment has a life- long use expectancy. Growth and changes in medical and/or functional conditions would be the exceptions. This is to certify that the therapist has no financial relationship with durable medical provider or manufacturer. The therapist will not receive remuneration of any kind for the equipment recommended in this evaluation.   Patient has mobility limitation that significantly impairs safe, timely participation in one or more mobility related ADL's.  (bathing, toileting, feeding, dressing, grooming, moving from room to room)                                                             [x]  Yes []  No Will mobility device sufficiently improve ability to participate and/or be  aided in participation of MRADL's?         [x]  Yes []  No Can limitation be compensated for with use of a cane or walker?                                                                                []  Yes [x]  No Does patient or caregiver demonstrate ability/potential ability & willingness to safely use the mobility device?   [x]  Yes []  No Does patient's home environment support use of recommended mobility device?                                                    [x]  Yes []  No Does patient have sufficient upper extremity function necessary to functionally propel a manual wheelchair?    []  Yes [x]  No Does patient have sufficient strength and trunk stability  to safely operate a POV (scooter)?                                  []  Yes [x]  No Does patient need additional features/benefits provided by a power wheelchair for MRADL's in the home?       [x]  Yes []  No Does the patient demonstrate the ability to safely use a power wheelchair?                                                              [x]  Yes []  No  Therapist Name Printed: ????? Date: ?????  Therapist's Signature:   Date:   Supplier's Name Printed: ????? Date: ?????  Supplier's Signature:   Date:  Patient/Caregiver Signature:   Date:     This is to certify that I have read this evaluation and do agree with the content within:    Physician's Name Printed: ?????  Physician's Signature:  Date:     This is to certify that I, the above signed therapist have                                       Patient will benefit from skilled therapeutic intervention in order to improve the following deficits and impairments:     Visit Diagnosis: Other abnormalities of gait and mobility       G-Codes - 2015-10-21 0954    Functional Assessment Tool Used gait velocity 0.44 ft/sec, after 30 ft of gait with SW, O2 sats are 89%; reports 3 falls in the past   Functional Limitation Mobility: Walking and moving  around   Mobility: Walking and Moving Around Current Status [phone]) At least 60 percent but less than 80 percent impaired, limited or restricted   Mobility: Walking and Moving Around Goal Status [phone]) At least 60 percent but less than 80 percent impaired, limited or restricted   Mobility: Walking and Moving Around Discharge Status [phone]) At least 60 percent but less than 80 percent impaired, limited or restricted      Problem List Patient Active Problem List   Diagnosis Date Noted  . Angioedema 12/30/2014  . Status post total replacement of right hip 11/23/2014  . Osteoarthritis of right hip 10/13/2014  . Shingles 11/19/2012  . Miliaria 07/16/2012  . DVT of lower extremity, bilateral ([place]) 04/04/2012  . Routine health maintenance 01/14/2012  . GERD (gastroesophageal reflux disease) 01/09/2012  . Urinary incontinence 12/31/2011  . Chronic kidney disease (CKD), stage III (moderate) 09/19/2010  . Pes planus, congenital 08/08/2010  . Chronic pelvic pain in female 02/03/2010  . Asthma, chronic 09/27/2009  . Elevated alkaline phosphatase level 08/01/2009  . Pain in joint, multiple sites 12/21/2008  . MAJOR DPRSV DISORDER RECURRENT EPISODE MODERATE 09/08/2008  . Gout 07/05/2008  . Hyperlipidemia 05/07/2006  . Coronary atherosclerosis 02/05/2006  . Morbid obesity ([place]) 12/13/2005  . History of alcohol abuse 12/13/2005  . SLEEP APNEA, OBSTRUCTIVE 12/13/2005  . Essential hypertension 12/13/2005    Mayank Teuscher W. 10/21/2015, 9:57 AM Frazier Butt., PT   [facility] [address] [place], [place], [address] Phone: [phone]  Fax:  [phone]  Name: Vanessa Palmer MRN: [id number] Date of Birth: 07-31-1950

## 2015-10-24 ENCOUNTER — Ambulatory Visit (INDEPENDENT_AMBULATORY_CARE_PROVIDER_SITE_OTHER): Payer: Medicare Other | Admitting: Dietician

## 2015-10-24 DIAGNOSIS — Z713 Dietary counseling and surveillance: Secondary | ICD-10-CM

## 2015-10-24 DIAGNOSIS — N183 Chronic kidney disease, stage 3 unspecified: Secondary | ICD-10-CM

## 2015-10-24 NOTE — Patient Instructions (Addendum)
Good Job decreasing the regular soda. This helps decrease your sugar intake and eat healthier!  You want to be 212 pounds by September 25th- next appointment.    Tour bloods like a highway. Keep moving to move the fluid to your kidneys.

## 2015-10-24 NOTE — Progress Notes (Signed)
  Medical Nutrition Therapy:  Appt start time: 910 AM   end time:  955 AM. Visit #10   Assessment:  Primary concerns today: weight loss and healthy eating  for kidney disease,anemia and activities of daily living. [name] weight goal is less than 200 #. She has continued with not being as active as usual because her wheelchair is broken. She ate fast food while traveling, but no soda.  ANTHROPOMETRICS: weight-215.3 .# increased 5# in past month, BMI- - obese class II  SLEEP:"ain't too good", gets up early, bedtime-9 PM woke up one time to urinate, then at 3 am for the day Activity: ADLS  DIETARY INTAKE: Usual eating pattern includes 2 meals and 1-2 snacks per day.   B: fruit cup, water today (usual is couple eggs, fruit cup) L: no lunch when she eats breakfast and vis versa, has fruit like muscadines or sandwich, [place]key, bologna, chicken salad, tuna  D: (5-7 PM) sandwich or breakfast foods or BBQ chicken, cornbread, cabbage and brown rice Snack(s)- fruit,  Beverages: water  Estimated energy needs: 1500-1600 for gradual loss 180- 200g carbohydrates 78 grams protein/day    Progress Towards Goal(s):  Some progress.   Nutritional Diagnosis:  NB-1.1 Food and nutrition-related knowledge deficit As related to lack of sufficient prior meal planning training and education continues to improve As evidenced by her report and ability to state healthier choices more often.    Intervention:  Nutrition education and support for increased activity and continued progress to making healthier low sodium more nutrient dense choices.  Teaching Method Utilized: Visual,,Auditory,Hands on Handouts given during visit include: AVS Barriers to learning/adherence to lifestyle change: competing values, support, unhealthy food environment Demonstrated degree of understanding via:  Teach Back   Monitoring/Evaluation:  Dietary intake, exercise, and body weight in 4 week(s).

## 2015-10-25 NOTE — Addendum Note (Signed)
Addended by: Bufford SpikesFULCHER, Gerard Bonus N on: 10/25/2015 04:46 PM   Modules accepted: Orders

## 2015-11-03 DIAGNOSIS — N3281 Overactive bladder: Secondary | ICD-10-CM | POA: Diagnosis not present

## 2015-11-18 ENCOUNTER — Other Ambulatory Visit: Payer: Self-pay | Admitting: Internal Medicine

## 2015-11-18 ENCOUNTER — Other Ambulatory Visit: Payer: Self-pay | Admitting: Oncology

## 2015-11-18 DIAGNOSIS — F331 Major depressive disorder, recurrent, moderate: Secondary | ICD-10-CM

## 2015-11-22 ENCOUNTER — Ambulatory Visit: Payer: Medicare Other | Admitting: Dietician

## 2015-11-23 ENCOUNTER — Ambulatory Visit (INDEPENDENT_AMBULATORY_CARE_PROVIDER_SITE_OTHER): Payer: Medicare Other | Admitting: Dietician

## 2015-11-23 ENCOUNTER — Encounter: Payer: Self-pay | Admitting: Internal Medicine

## 2015-11-23 ENCOUNTER — Ambulatory Visit (INDEPENDENT_AMBULATORY_CARE_PROVIDER_SITE_OTHER): Payer: Medicare Other | Admitting: Internal Medicine

## 2015-11-23 VITALS — BP 144/85 | HR 62 | Temp 98.1°F | Ht 63.0 in | Wt 214.9 lb

## 2015-11-23 DIAGNOSIS — M2550 Pain in unspecified joint: Secondary | ICD-10-CM | POA: Diagnosis not present

## 2015-11-23 DIAGNOSIS — Z713 Dietary counseling and surveillance: Secondary | ICD-10-CM | POA: Diagnosis not present

## 2015-11-23 DIAGNOSIS — R1012 Left upper quadrant pain: Secondary | ICD-10-CM | POA: Diagnosis not present

## 2015-11-23 DIAGNOSIS — N183 Chronic kidney disease, stage 3 unspecified: Secondary | ICD-10-CM

## 2015-11-23 DIAGNOSIS — I10 Essential (primary) hypertension: Secondary | ICD-10-CM

## 2015-11-23 DIAGNOSIS — R748 Abnormal levels of other serum enzymes: Secondary | ICD-10-CM

## 2015-11-23 DIAGNOSIS — R112 Nausea with vomiting, unspecified: Secondary | ICD-10-CM | POA: Diagnosis not present

## 2015-11-23 DIAGNOSIS — R296 Repeated falls: Secondary | ICD-10-CM

## 2015-11-23 DIAGNOSIS — R5382 Chronic fatigue, unspecified: Secondary | ICD-10-CM

## 2015-11-23 DIAGNOSIS — G8929 Other chronic pain: Secondary | ICD-10-CM

## 2015-11-23 DIAGNOSIS — Z79891 Long term (current) use of opiate analgesic: Secondary | ICD-10-CM

## 2015-11-23 DIAGNOSIS — R5383 Other fatigue: Secondary | ICD-10-CM | POA: Insufficient documentation

## 2015-11-23 DIAGNOSIS — E785 Hyperlipidemia, unspecified: Secondary | ICD-10-CM | POA: Diagnosis not present

## 2015-11-23 DIAGNOSIS — R2681 Unsteadiness on feet: Secondary | ICD-10-CM

## 2015-11-23 MED ORDER — OMEPRAZOLE 40 MG PO CPDR: 40.0000 mg | DELAYED_RELEASE_CAPSULE | Freq: Every day | ORAL | 1 refills | Status: DC

## 2015-11-23 MED ORDER — OXYCODONE-ACETAMINOPHEN 10-325 MG PO TABS: 1.0000 | ORAL_TABLET | Freq: Three times a day (TID) | ORAL | 0 refills | Status: DC | PRN

## 2015-11-23 MED ORDER — OXYBUTYNIN CHLORIDE 5 MG PO TABS: 5.0000 mg | ORAL_TABLET | Freq: Two times a day (BID) | ORAL | Status: DC

## 2015-11-23 MED ORDER — ONDANSETRON HCL 4 MG PO TABS: 4.0000 mg | ORAL_TABLET | Freq: Three times a day (TID) | ORAL | 0 refills | Status: DC | PRN

## 2015-11-23 NOTE — Patient Instructions (Addendum)
Try to eat 5 servings of fruit and vegetables every day.   Put a check mark on the sheets provided when you eat a serving of fruit or a serving of vegetables.   Bring  the sheets back to your next visit

## 2015-11-23 NOTE — Assessment & Plan Note (Signed)
This is chronic for her.  Likely related to acute illness. She also has chronic pain, so will check vitamin D.  Reassess fatigue when acute illness resolved.

## 2015-11-23 NOTE — Progress Notes (Signed)
  Medical Nutrition Therapy:  Appt start time: 920 AM   end time:  955 AM. Visit #11   Assessment:  Primary concerns today: weight loss and healthy eating  for kidney disease,anemia. Vanessa Palmer weight goal is less than 200 #. She reports eating fried foods, ate fast food while traveling, but no soda. Feels she is eating small amounts that should provide weight loss. . She reports continued skipping of meals, insufficient fruit and vegetable intake. Acknowledges chronic kidney disease and knows she should decrease sodium.   ANTHROPOMETRICS: weight-214.9# - little change, BMI- obese class II  SLEEP: not addressed today Activity: ADLS, sedentary- working on getting wheelchair fixed  DIETARY INTAKE: Usual eating pattern includes 2 meals and 1-2 snacks per day.   B: nothing L: fried chicken wings x2 onion rings, corn on the cob, broccoli, jello,  D: (5-7 PM) regular ginger ale Beverages: water, sweet tea, ginger ale- diet and regular  Estimated energy needs: 1500-1600 for gradual loss 180- 200g carbohydrates 78 grams protein/day    Progress Towards Goal(s):  Some progress.   Nutritional Diagnosis:  NB-1.1 Food and nutrition-related knowledge deficit As related to lack of sufficient prior meal planning training and education continues to improve As evidenced by her report and ability to state healthier choices more often. However, actions have not followed knowledge.     Intervention:  Nutrition education and support for increased activity and continued progress to making healthier low sodium more nutrient dense choices.  Teaching Method Utilized: Visual,,Auditory,Hands on Handouts given during visit include: AVS Barriers to learning/adherence to lifestyle change: competing values, support, unhealthy food environment Demonstrated degree of understanding via:  Teach Back   Monitoring/Evaluation:  Dietary intake, exercise, and body weight in 4 week(s).

## 2015-11-23 NOTE — Patient Instructions (Signed)
Vanessa Palmer - -   For your nausea, please INCREASE your omeprazole to twice a day while you are feeling unwell.    You will have Zofran (ondansetron) to take for your nausea so that you can eat.  I think your symptoms are most consistent with a viral illness, so it should improve over the next few days.  I am checking some labwork to make sure it is not due to something more concerning.   Please come back to the clinic in 2-3 months for evaluation of your other medical needs.    Thank you!  Ondansetron tablets What is this medicine? ONDANSETRON (on DAN se tron) is used to treat nausea and vomiting caused by chemotherapy. It is also used to prevent or treat nausea and vomiting after surgery. This medicine may be used for other purposes; ask your health care provider or pharmacist if you have questions. What should I tell my health care provider before I take this medicine? They need to know if you have any of these conditions: -heart disease -history of irregular heartbeat -liver disease -low levels of magnesium or potassium in the blood -an unusual or allergic reaction to ondansetron, granisetron, other medicines, foods, dyes, or preservatives -pregnant or trying to get pregnant -breast-feeding How should I use this medicine? Take this medicine by mouth with a glass of water. Follow the directions on your prescription label. Take your doses at regular intervals. Do not take your medicine more often than directed. Talk to your pediatrician regarding the use of this medicine in children. Special care may be needed. Overdosage: If you think you have taken too much of this medicine contact a poison control center or emergency room at once. NOTE: This medicine is only for you. Do not share this medicine with others. What if I miss a dose? If you miss a dose, take it as soon as you can. If it is almost time for your next dose, take only that dose. Do not take double or extra doses. What may  interact with this medicine? Do not take this medicine with any of the following medications: -apomorphine -certain medicines for fungal infections like fluconazole, itraconazole, ketoconazole, posaconazole, voriconazole -cisapride -dofetilide -dronedarone -pimozide -thioridazine -ziprasidone This medicine may also interact with the following medications: -carbamazepine -certain medicines for depression, anxiety, or psychotic disturbances -fentanyl -linezolid -MAOIs like Carbex, Eldepryl, Marplan, Nardil, and Parnate -methylene blue (injected into a vein) -other medicines that prolong the QT interval (cause an abnormal heart rhythm) -phenytoin -rifampicin -tramadol This list may not describe all possible interactions. Give your health care provider a list of all the medicines, herbs, non-prescription drugs, or dietary supplements you use. Also tell them if you smoke, drink alcohol, or use illegal drugs. Some items may interact with your medicine. What should I watch for while using this medicine? Check with your doctor or health care professional right away if you have any sign of an allergic reaction. What side effects may I notice from receiving this medicine? Side effects that you should report to your doctor or health care professional as soon as possible: -allergic reactions like skin rash, itching or hives, swelling of the face, lips or tongue -breathing problems -confusion -dizziness -fast or irregular heartbeat -feeling faint or lightheaded, falls -fever and chills -loss of balance or coordination -seizures -sweating -swelling of the hands or feet -tightness in the chest -tremors -unusually weak or tired Side effects that usually do not require medical attention (report to your doctor or health  care professional if they continue or are bothersome): -constipation or diarrhea -headache This list may not describe all possible side effects. Call your doctor for medical  advice about side effects. You may report side effects to FDA at 1-800-FDA-1088. Where should I keep my medicine? Keep out of the reach of children. Store between 2 and 30 degrees C (36 and 86 degrees F). Throw away any unused medicine after the expiration date. NOTE: This sheet is a summary. It may not cover all possible information. If you have questions about this medicine, talk to your doctor, pharmacist, or health care provider.    2016, [employer]vier/[employer] Standard. (2012-11-19 16:27:45)

## 2015-11-23 NOTE — Progress Notes (Signed)
   Subjective:    Patient ID: Vanessa Palmer, female    DOB: 05-30-1950, 65 y.o.   MRN: [id number]  CC: Powered wheelchair evaluation  HPI   Vanessa Palmer is a 64yo woman who presents for powered wheelchair evaluation.    She requires motorized wheelchair for assistance with moving from room to room in home and meal prep.  Takes her an hour to make her bed due to fatigue.  No recent falls since I last saw her, but she does fall frequently at home and this is a concern for safety.    I have reviewed the PT evaluation and agree with findings noted.   Nausea/vomiting for 3 days.  Everything she eats seems to want to come up.  She was able to eat today, however, yesterday she had emesis.  She had bread, and a slice of cheese and meat.  No heartburn, odynophagia.  Seemed to have pill dysphagia yesterday evening.  No diarrhea or constipation.  Mild upset stomach feeling.  No fever chills.   Tried pepto bismol, came back up.  Urination is the same, no decreased intake.  Able to keep fluids down.    Cough for 1 month, 2 weeks ago increased fatigue and needed to stay in bed.  Fevers/chills at that time.  Cough has improved, not as bad as she was having.  She did have some very small amount of hemoptysis which resolved.    Dry scaling skin - puts lotion on.    QTc 430  Review of Systems  Constitutional: Positive for fatigue. Negative for chills and fever.  Gastrointestinal: Positive for nausea and vomiting. Negative for abdominal pain, constipation and diarrhea.  Genitourinary: Positive for enuresis. Negative for difficulty urinating.  Musculoskeletal: Positive for arthralgias, back pain and gait problem.  Neurological: Positive for weakness (legs, uses walker). Negative for dizziness.       Objective:   Physical Exam  Constitutional: She is oriented to person, place, and time. She appears well-developed and well-nourished.  She looks somewhat more tired than normal for her.   HENT:  Head:  Normocephalic and atraumatic.  Eyes: Conjunctivae are normal. No scleral icterus.  Cardiovascular: Normal rate, regular rhythm and normal heart sounds.   No murmur heard. Pulmonary/Chest: Effort normal and breath sounds normal. No respiratory distress. She has no wheezes.  Abdominal: Soft.  Bowel sounds decreased.  No distention.  TTP in LUQ.  Otherwise no tenderness.  No mass palpated.   Musculoskeletal: She exhibits no edema.  She has a slowed gait and is using a walker for ambulation in the exam room.  Neurological: She is alert and oriented to person, place, and time.  Skin:  Dry skin to bilateral lower legs.   Psychiatric: She has a normal mood and affect. Her behavior is normal.    CMET, vitamin D and lipase today.       Assessment & Plan:  RTC in 1-2 months, sooner if necessary.  I will be communicating with patient about her abdominal pain in the interim.

## 2015-11-23 NOTE — Assessment & Plan Note (Signed)
I think is likely a viral illness, but given LUQ pain am concerned for an acute gastritis vs. bacterial gastroenteritis vs. Pancreatitis.  She denies ETOH use, she did not have abnl triglycerides from the past.  Will check lipase and cmet.  She is hemodynamically stable and does not appear to be acutely ill on exam.   Plan PPI daily - new Rx for omeprazole given Zofran PRN She is to call clinic later this week if not improved.

## 2015-11-23 NOTE — Assessment & Plan Note (Signed)
She further has gait instability and frequent falls.  She has been evaluated by PT/Rehab who noted that she would qualify for a motorized wheelchair.  I reviewed their notes and agree with their assessment.  She has trouble with her ADLs in the home as noted in HPI.  She would greatly benefit from increased assistance in the home in the manner of a motorized wheelchair.  For her pain she is treated by me with Percocet 10/325.  She has been on this dose for a while.  She is on pain medications to allow her to function (much improved since having pain controlled) and she is able to do what she enjoys with good pain control.  She has been on a stable dose for a few years now.  Review of narcotic database did show that she received a prescription from outside the clinic.  Will need to discuss further at next visit.  This is a violation of her pain contract, however, I would discuss with her in person at her next appointment.    She is currently on a dose < 50 MME.  Though no dose is completely safe, this is a safer dose for her  Plan UDS at next visit Update narcotic contract and remind her not to receive medications from other providers Ongoing pain assessment.

## 2015-11-24 LAB — LIPID PANEL
CHOLESTEROL TOTAL: 98 mg/dL — AB (ref 100–199)
Chol/HDL Ratio: 2.2 ratio units (ref 0.0–4.4)
HDL: 45 mg/dL (ref >39)
LDL Calculated: 43 mg/dL (ref 0–99)
Triglycerides: 49 mg/dL (ref 0–149)
VLDL CHOLESTEROL CAL: 10 mg/dL (ref 5–40)

## 2015-11-24 LAB — CMP14 + ANION GAP
A/G RATIO: 1.2 (ref 1.2–2.2)
ALBUMIN: 4.1 g/dL (ref 3.6–4.8)
ALK PHOS: 194 IU/L — AB (ref 39–117)
ALT: 10 IU/L (ref 0–32)
AST: 21 IU/L (ref 0–40)
Anion Gap: 19 mmol/L — ABNORMAL HIGH (ref 10.0–18.0)
BILIRUBIN TOTAL: 0.9 mg/dL (ref 0.0–1.2)
BUN / CREAT RATIO: 11 — AB (ref 12–28)
BUN: 17 mg/dL (ref 8–27)
CHLORIDE: 95 mmol/L — AB (ref 96–106)
CO2: 24 mmol/L (ref 18–29)
Calcium: 9 mg/dL (ref 8.7–10.3)
Creatinine, Ser: 1.53 mg/dL — ABNORMAL HIGH (ref 0.57–1.00)
GFR calc non Af Amer: 36 mL/min/{1.73_m2} — ABNORMAL LOW (ref >59)
GFR, EST AFRICAN AMERICAN: 42 mL/min/{1.73_m2} — AB (ref >59)
GLOBULIN, TOTAL: 3.3 g/dL (ref 1.5–4.5)
Glucose: 91 mg/dL (ref 65–99)
POTASSIUM: 4.1 mmol/L (ref 3.5–5.2)
SODIUM: 138 mmol/L (ref 134–144)
TOTAL PROTEIN: 7.4 g/dL (ref 6.0–8.5)

## 2015-11-24 LAB — LIPASE: LIPASE: 32 U/L (ref 0–59)

## 2015-11-24 LAB — VITAMIN D 25 HYDROXY (VIT D DEFICIENCY, FRACTURES): VIT D 25 HYDROXY: 23 ng/mL — AB (ref 30.0–100.0)

## 2015-12-01 ENCOUNTER — Telehealth: Payer: Self-pay | Admitting: Internal Medicine

## 2015-12-01 NOTE — Telephone Encounter (Signed)
She further reported that her abdominal symptoms had resolved.

## 2015-12-01 NOTE — Telephone Encounter (Signed)
Called and spoke with Vanessa Palmer regarding her recent blood work.   Vitamin D Mildly low.  I advised her to start taking supplemental vitamin D at 1000 IU per day, given her renal function.  Will recheck at next visit.    Vanessa Palmer had no questions.

## 2015-12-07 ENCOUNTER — Encounter (HOSPITAL_COMMUNITY): Payer: Self-pay | Admitting: *Deleted

## 2015-12-07 ENCOUNTER — Emergency Department (HOSPITAL_COMMUNITY)
Admission: EM | Admit: 2015-12-07 | Discharge: 2015-12-08 | Disposition: A | Discharge status: Home / Self Care | Payer: Medicare Other | Attending: Emergency Medicine | Admitting: Emergency Medicine

## 2015-12-07 ENCOUNTER — Emergency Department (HOSPITAL_COMMUNITY): Payer: Medicare Other

## 2015-12-07 DIAGNOSIS — N183 Chronic kidney disease, stage 3 (moderate): Secondary | ICD-10-CM | POA: Diagnosis not present

## 2015-12-07 DIAGNOSIS — Z7901 Long term (current) use of anticoagulants: Secondary | ICD-10-CM | POA: Diagnosis not present

## 2015-12-07 DIAGNOSIS — I2510 Atherosclerotic heart disease of native coronary artery without angina pectoris: Secondary | ICD-10-CM | POA: Diagnosis not present

## 2015-12-07 DIAGNOSIS — Z91010 Allergy to peanuts: Secondary | ICD-10-CM | POA: Diagnosis not present

## 2015-12-07 DIAGNOSIS — I509 Heart failure, unspecified: Secondary | ICD-10-CM | POA: Diagnosis not present

## 2015-12-07 DIAGNOSIS — Z96641 Presence of right artificial hip joint: Secondary | ICD-10-CM | POA: Diagnosis not present

## 2015-12-07 DIAGNOSIS — R1031 Right lower quadrant pain: Secondary | ICD-10-CM | POA: Insufficient documentation

## 2015-12-07 DIAGNOSIS — M25551 Pain in right hip: Secondary | ICD-10-CM | POA: Diagnosis not present

## 2015-12-07 DIAGNOSIS — J45909 Unspecified asthma, uncomplicated: Secondary | ICD-10-CM | POA: Diagnosis not present

## 2015-12-07 DIAGNOSIS — I130 Hypertensive heart and chronic kidney disease with heart failure and stage 1 through stage 4 chronic kidney disease, or unspecified chronic kidney disease: Secondary | ICD-10-CM | POA: Diagnosis not present

## 2015-12-07 LAB — URINALYSIS, ROUTINE W REFLEX MICROSCOPIC
BILIRUBIN URINE: NEGATIVE
Glucose, UA: NEGATIVE mg/dL
HGB URINE DIPSTICK: NEGATIVE
Ketones, ur: NEGATIVE mg/dL
Leukocytes, UA: NEGATIVE
Nitrite: NEGATIVE
PH: 6 (ref 5.0–8.0)
Protein, ur: NEGATIVE mg/dL
SPECIFIC GRAVITY, URINE: 1.006 (ref 1.005–1.030)

## 2015-12-07 LAB — COMPREHENSIVE METABOLIC PANEL
ALBUMIN: 3.7 g/dL (ref 3.5–5.0)
ALK PHOS: 131 U/L — AB (ref 38–126)
ALT: 10 U/L — ABNORMAL LOW (ref 14–54)
ANION GAP: 7 (ref 5–15)
AST: 18 U/L (ref 15–41)
BUN: 12 mg/dL (ref 6–20)
CHLORIDE: 104 mmol/L (ref 101–111)
CO2: 26 mmol/L (ref 22–32)
Calcium: 8.8 mg/dL — ABNORMAL LOW (ref 8.9–10.3)
Creatinine, Ser: 1.49 mg/dL — ABNORMAL HIGH (ref 0.44–1.00)
GFR calc Af Amer: 42 mL/min — ABNORMAL LOW (ref >60)
GFR calc non Af Amer: 36 mL/min — ABNORMAL LOW (ref >60)
GLUCOSE: 102 mg/dL — AB (ref 65–99)
POTASSIUM: 4.4 mmol/L (ref 3.5–5.1)
SODIUM: 137 mmol/L (ref 135–145)
Total Bilirubin: 1.1 mg/dL (ref 0.3–1.2)
Total Protein: 7.2 g/dL (ref 6.5–8.1)

## 2015-12-07 LAB — CBC
HEMATOCRIT: 35.4 % — AB (ref 36.0–46.0)
HEMOGLOBIN: 11.9 g/dL — AB (ref 12.0–15.0)
MCH: 30.4 pg (ref 26.0–34.0)
MCHC: 33.6 g/dL (ref 30.0–36.0)
MCV: 90.5 fL (ref 78.0–100.0)
Platelets: 217 10*3/uL (ref 150–400)
RBC: 3.91 MIL/uL (ref 3.87–5.11)
RDW: 13.5 % (ref 11.5–15.5)
WBC: 5.1 10*3/uL (ref 4.0–10.5)

## 2015-12-07 LAB — LIPASE, BLOOD: Lipase: 36 U/L (ref 11–51)

## 2015-12-07 MED ORDER — IOPAMIDOL (ISOVUE-300) INJECTION 61%
INTRAVENOUS | Status: AC
  Administered 2015-12-07: 75 mL
  Filled 2015-12-07: qty 75

## 2015-12-07 MED ORDER — OXYCODONE HCL 5 MG PO TABS
5.0000 mg | ORAL_TABLET | Freq: Once | ORAL | Status: AC
  Administered 2015-12-07: 5 mg via ORAL
  Filled 2015-12-07: qty 1

## 2015-12-07 NOTE — ED Notes (Signed)
Pt ambulatory to restroom with assistive device (cane); stand by assist

## 2015-12-07 NOTE — ED Triage Notes (Signed)
The pt is c/o abd pain since this am no urinary symptoms.  Rt lower  The pain is increrasing.  No n or v

## 2015-12-07 NOTE — ED Provider Notes (Signed)
[facility]-EMERGENCY DEPT Provider Note   CSN: [id number] Arrival date & time: 12/07/15  1743     History   Chief Complaint Chief Complaint  Patient presents with  . Abdominal Pain    HPI Vanessa Palmer is a 65 y.o. female.  The history is provided by the patient.  Abdominal Pain   This is a new problem. The current episode started yesterday. The problem occurs constantly. The problem has been gradually worsening. The pain is associated with an unknown factor. The pain is located in the RLQ. The quality of the pain is sharp and aching. The pain is severe. Pertinent negatives include fever, belching, diarrhea, flatus, hematochezia, nausea, vomiting, constipation, dysuria, hematuria, headaches and myalgias. The symptoms are aggravated by activity, palpation and certain positions (and walking). The symptoms are relieved by being still. Past workup does not include surgery.    Past Medical History:  Diagnosis Date  . ALCOHOL ABUSE 12/13/2005   Annotation: Sober since 11/06 Qualifier: Diagnosis of  By: Wallace Cullens MD, Natalia Leatherwood    . Anemia   . CAD (coronary artery disease)    s/p non-Q wave MI in 06/2001 and 2004, 2005  . CHF (congestive heart failure) (HCC)   . Chronic kidney disease (CKD), stage III (moderate) 09/19/2010  . Depression   . DJD (degenerative joint disease)   . DVT of lower extremity, bilateral (HCC) 04/04/2012   On Xarelto    . GERD (gastroesophageal reflux disease)   . Gout   . Headache    migraines  . Hyperlipidemia   . Hypertension   . INTRINSIC ASTHMA, WITH EXACERBATION 09/27/2009   Qualifier: Diagnosis of  By: Denton Meek MD, Tillie Rung    . Obesity   . OSA (obstructive sleep apnea)    previously used CPAP, has lost  . Seizures (HCC)    As a teenager     Patient Active Problem List   Diagnosis Date Noted  . Fatigue 11/23/2015  . Nausea and vomiting 11/23/2015  . Angioedema 12/30/2014  . Status post total replacement of right hip 11/23/2014  . Osteoarthritis of right  hip 10/13/2014  . Miliaria 07/16/2012  . DVT of lower extremity, bilateral (HCC) 04/04/2012  . Routine health maintenance 01/14/2012  . GERD (gastroesophageal reflux disease) 01/09/2012  . Urinary incontinence 12/31/2011  . Chronic kidney disease (CKD), stage III (moderate) 09/19/2010  . Pes planus, congenital 08/08/2010  . Chronic pelvic pain in female 02/03/2010  . Asthma, chronic 09/27/2009  . Elevated alkaline phosphatase level 08/01/2009  . Pain in joint, multiple sites 12/21/2008  . MAJOR DPRSV DISORDER RECURRENT EPISODE MODERATE 09/08/2008  . Gout 07/05/2008  . Hyperlipidemia 05/07/2006  . Coronary atherosclerosis 02/05/2006  . Morbid obesity (HCC) 12/13/2005  . History of alcohol abuse 12/13/2005  . SLEEP APNEA, OBSTRUCTIVE 12/13/2005  . Essential hypertension 12/13/2005    Past Surgical History:  Procedure Laterality Date  . CARDIAC CATHETERIZATION     06/2001, 02/2002, 10/2004 (10-15% proximal stenosis of circumflex, diffuse disease of  OM1 and RCA)  . COLONOSCOPY    . KNEE ARTHROSCOPY Left   . PARTIAL HYSTERECTOMY     due to endometriosis  . TOTAL HIP ARTHROPLASTY Right 11/23/2014   Procedure: RIGHT TOTAL HIP ARTHROPLASTY ANTERIOR APPROACH;  Surgeon: Kathryne Hitch, MD;  Location: [facility];  Service: Orthopedics;  Laterality: Right;  . TUBAL LIGATION      OB History    No data available       Home Medications    Prior to  Admission medications   Medication Sig Start Date End Date Taking? Authorizing Provider  acetaminophen (TYLENOL) 500 MG tablet Take 1,000 mg by mouth every 6 (six) hours as needed for moderate pain.   Yes Historical Provider, MD  amLODipine (NORVASC) 5 MG tablet Take 5 mg by mouth daily.  04/11/15  Yes Historical Provider, MD  atorvastatin (LIPITOR) 40 MG tablet TAKE 1 TABLET (40 MG TOTAL) BY MOUTH DAILY. 11/22/15  Yes Inez CatalinaEmily B Mullen, MD  buPROPion (WELLBUTRIN XL) 150 MG 24 hr tablet Take 1 tablet (150 mg total) by mouth every morning.  08/11/14  Yes Inez CatalinaEmily B Mullen, MD  famotidine (PEPCID) 20 MG tablet Take 1 tablet (20 mg total) by mouth 2 (two) times daily. 08/11/14  Yes Inez CatalinaEmily B Mullen, MD  fluticasone (FLONASE) 50 MCG/ACT nasal spray INSTILL TWO   SPRAYS IN EACH NOSTRIL ONCE DAILY Patient taking differently: INSTILL TWO   SPRAYS IN EACH NOSTRIL ONCE DAILY AS NEEDED FOR ALLERGIES 11/22/15  Yes Inez CatalinaEmily B Mullen, MD  fluticasone (FLOVENT HFA) 44 MCG/ACT inhaler Inhale 2 puffs into the lungs 2 (two) times daily. Patient taking differently: Inhale 2 puffs into the lungs 2 (two) times daily as needed (shortness of breath).  06/02/13  Yes Pasty Spillersracy N McLean-Scocuzza, MD  furosemide (LASIX) 20 MG tablet TAKE TWO   TABLETS (40 MG TOTAL) BY MOUTH DAILY. 08/07/15  Yes Inez CatalinaEmily B Mullen, MD  metoprolol (LOPRESSOR) 50 MG tablet TAKE 1 TABLET (50 MG TOTAL) BY MOUTH DAILY. 08/07/15  Yes Inez CatalinaEmily B Mullen, MD  nitroGLYCERIN (NITROSTAT) 0.4 MG SL tablet Place 0.4 mg under the tongue every 5 (five) minutes as needed for chest pain.   Yes Historical Provider, MD  omeprazole (PRILOSEC) 40 MG capsule Take 1 capsule (40 mg total) by mouth daily. 11/23/15  Yes Inez CatalinaEmily B Mullen, MD  ondansetron (ZOFRAN) 4 MG tablet Take 1 tablet (4 mg total) by mouth every 8 (eight) hours as needed for nausea or vomiting. 11/23/15  Yes Inez CatalinaEmily B Mullen, MD  oxybutynin (DITROPAN) 5 MG tablet Take 1 tablet (5 mg total) by mouth 2 (two) times daily. 11/23/15  Yes Inez CatalinaEmily B Mullen, MD  oxyCODONE-acetaminophen (PERCOCET) 10-325 MG tablet Take 1 tablet by mouth every 8 (eight) hours as needed for pain. 11/23/15 10/30/16 Yes Inez CatalinaEmily B Mullen, MD  ranitidine (ZANTAC) 150 MG tablet Take 1 tablet (150 mg total) by mouth 2 (two) times daily. Patient taking differently: Take 150 mg by mouth 2 (two) times daily as needed for heartburn.  11/01/14  Yes Angelina Okyan Franasiak, MD  rivaroxaban (XARELTO) 20 MG TABS tablet Take 1 tablet (20 mg total) by mouth daily with supper. 06/13/15  Yes Doneen PoissonLawrence Klima, MD  tiZANidine  (ZANAFLEX) 4 MG tablet Take 1 tablet (4 mg total) by mouth every 6 (six) hours as needed for muscle spasms. 11/26/14  Yes Kathryne Hitchhristopher Y Blackman, MD  traZODone (DESYREL) 50 MG tablet TAKE 0.5 TABLETS (25 MG TOTAL) BY MOUTH AT BEDTIME. 04/30/15  Yes Inez CatalinaEmily B Mullen, MD    Family History Family History  Problem Relation Age of Onset  . Mental illness Mother   . Heart disease Father   . Diabetes Sister   . Diabetes Sister   . Diabetes Sister     Social History Social History  Substance Use Topics  . Smoking status: Never Smoker  . Smokeless tobacco: Never Used  . Alcohol use No     Comment: last alcohol use 02/27/14     Allergies   Ace inhibitors; Lexiscan [  regadenoson]; Peanuts [peanut oil]; Hydromorphone hcl; Percocet [oxycodone-acetaminophen]; Propoxyphene n-acetaminophen; and Vicodin [hydrocodone-acetaminophen]   Review of Systems Review of Systems  Constitutional: Negative for chills, fatigue and fever.  HENT: Negative for congestion.   Respiratory: Negative for shortness of breath.   Cardiovascular: Negative for chest pain.  Gastrointestinal: Positive for abdominal pain. Negative for constipation, diarrhea, flatus, hematochezia, nausea and vomiting.  Genitourinary: Positive for flank pain. Negative for decreased urine volume, dysuria, hematuria and vaginal discharge.       States not sexually active in years  Musculoskeletal: Negative for myalgias.  Skin: Negative for rash.  Neurological: Negative for headaches.  Psychiatric/Behavioral: Negative for confusion.     Physical Exam Updated Vital Signs BP (!) 122/53   Pulse (!) 49   Temp 98.3 F (36.8 C)   Resp 11   SpO2 100%   Physical Exam  Constitutional: She is oriented to person, place, and time. She appears well-developed and well-nourished. No distress.  Pleasant, cooperative, well-appearing  HENT:  Head: Normocephalic and atraumatic.  Eyes: Conjunctivae are normal. No scleral icterus.  Neck: Normal range of  motion. Neck supple.  Cardiovascular: Normal rate, regular rhythm and intact distal pulses.   Pulmonary/Chest: Effort normal and breath sounds normal.  Abdominal: Soft. She exhibits no distension. There is tenderness. There is guarding. There is no rebound.  RLQ TTP with involuntary guarding. Right CVA TTP.  Increased RLQ pain with straight leg raise. Positive Carnett sign.  Musculoskeletal: She exhibits no edema.  Right SI TTP  Neurological: She is alert and oriented to person, place, and time. She exhibits normal muscle tone. Coordination normal.  Skin: Skin is warm and dry. Capillary refill takes less than 2 seconds. No rash noted. She is not diaphoretic.  Psychiatric: She has a normal mood and affect.  Nursing note and vitals reviewed.    ED Treatments / Results  Labs (all labs ordered are listed, but only abnormal results are displayed) Labs Reviewed  COMPREHENSIVE METABOLIC PANEL - Abnormal; Notable for the following:       Result Value   Glucose, Bld 102 (*)    Creatinine, Ser 1.49 (*)    Calcium 8.8 (*)    ALT 10 (*)    Alkaline Phosphatase 131 (*)    GFR calc non Af Amer 36 (*)    GFR calc Af Amer 42 (*)    All other components within normal limits  CBC - Abnormal; Notable for the following:    Hemoglobin 11.9 (*)    HCT 35.4 (*)    All other components within normal limits  LIPASE, BLOOD  URINALYSIS, ROUTINE W REFLEX MICROSCOPIC (NOT AT [facility])    EKG  EKG Interpretation None       Radiology Ct Abdomen Pelvis W Contrast  Result Date: 12/08/2015 CLINICAL DATA:  Acute onset of right lower quadrant abdominal pain. Initial encounter. EXAM: CT ABDOMEN AND PELVIS WITH CONTRAST TECHNIQUE: Multidetector CT imaging of the abdomen and pelvis was performed using the standard protocol following bolus administration of intravenous contrast. CONTRAST:  75 mL ISOVUE-300 IOPAMIDOL (ISOVUE-300) INJECTION 61% COMPARISON:  CT of the abdomen and pelvis from 09/02/2014 FINDINGS:  Lower chest: Minimal bibasilar atelectasis or scarring is noted. The visualized portions of the mediastinum are unremarkable. Hepatobiliary: The liver is unremarkable in appearance. The gallbladder is unremarkable in appearance. The common bile duct remains normal in caliber. Pancreas: The pancreas is within normal limits. Spleen: The spleen is unremarkable in appearance. Adrenals/Urinary Tract: The adrenal glands are unremarkable  in appearance. The kidneys are within normal limits. There is no evidence of hydronephrosis. No renal or ureteral stones are identified. No perinephric stranding is seen. Stomach/Bowel: The stomach is unremarkable in appearance. The small bowel is within normal limits. The appendix is normal in caliber, without evidence of appendicitis. The colon is unremarkable in appearance. Vascular/Lymphatic: The abdominal aorta is unremarkable in appearance. The inferior vena cava is grossly unremarkable. No retroperitoneal lymphadenopathy is seen. No pelvic sidewall lymphadenopathy is identified. Reproductive: The bladder is mildly distended and grossly unremarkable. The patient is status post hysterectomy. No suspicious adnexal masses are seen. Other: No additional soft tissue abnormalities are seen. Musculoskeletal: No acute osseous abnormalities are identified. There is mild chronic loss of height at L4. Mild disc space narrowing is noted at L4-L5. The patient's right hip arthroplasty is incompletely imaged but appears grossly unremarkable. The visualized musculature is unremarkable in appearance. IMPRESSION: 1. No acute abnormality seen in the abdomen or pelvis. 2. Mild chronic loss of height at L4. Electronically Signed   By: Roanna Raider M.D.   On: 12/08/2015 00:33    Procedures Procedures (including critical care time)  Medications Ordered in ED Medications  oxyCODONE (Oxy IR/ROXICODONE) immediate release tablet 5 mg (5 mg Oral Given 12/07/15 2242)  iopamidol (ISOVUE-300) 61 %  injection (75 mLs  Contrast Given 12/07/15 2327)     Initial Impression / Assessment and Plan / ED Course  I have reviewed the triage vital signs and the nursing notes.  Pertinent labs & imaging results that were available during my care of the patient were reviewed by me and considered in my medical decision making (see chart for details).  Clinical Course   Vanessa Palmer is a 65 y.o. female with h/o partial hysterectomy, no other abdominal procedures, who presents to ED for rapid-onset RLQ abdominal/right hip pain yesterday that has persisted and worse with ambulation/movements/sitting directly upright. Normal weightbearing, just increased pain with ambulation. No fevers/chills, nausea/vomiting, diarrhea, constipation, or other abdominal signs. No injuries. Labs at baseline, largely unremarkable. Watched pt ambulate to bathroom using her cane without difficultly, mildly antalgic gait. Positive Carnett sign and negative CT abdomen/pelvis, most likely c/w MSK pain, 2/2 right hip or SI joint. Advised to take tylenol (cannot take NSAIDS 2/2 CKD) and heating pad, f/u with PCP this week for recheck. Advised to return to ER for any new, worse, or concerning symptoms. She demonstrates understanding of this and comfort with d/c home.  Pt condition, course, and discharge were discussed with attending physician Dr. Eber Hong.   Final Clinical Impressions(s) / ED Diagnoses   Final diagnoses:  Right lower quadrant abdominal pain  Right hip pain    New Prescriptions New Prescriptions   No medications on file     Horald Pollen, MD 12/08/15 1478    Eber Hong, MD 12/10/15 [phone]

## 2015-12-07 NOTE — ED Provider Notes (Signed)
The patient is a 65 year old female, prior history of partial hysterectomy and bilateral tubal ligation many years ago. She presents with abdominal pain that started last night but became worse this morning and has been persistent throughout the day. On exam the patient has tenderness in the right lower quadrant, less so on the right upper quadrant, positive Rovsing, positive Carnett's sign, no peripheral edema, normal heart and lung exam.  Needs r/o appy - possible muscle wall injury  I saw and evaluated the patient, reviewed the resident's note and I agree with the findings and plan.   Final diagnoses:  Right lower quadrant abdominal pain  Right hip pain      Eber HongBrian Zakariyah Freimark, MD 12/10/15 [phone]

## 2015-12-15 DIAGNOSIS — R609 Edema, unspecified: Secondary | ICD-10-CM | POA: Diagnosis not present

## 2015-12-15 DIAGNOSIS — M1990 Unspecified osteoarthritis, unspecified site: Secondary | ICD-10-CM | POA: Diagnosis not present

## 2015-12-15 DIAGNOSIS — J45901 Unspecified asthma with (acute) exacerbation: Secondary | ICD-10-CM | POA: Diagnosis not present

## 2015-12-20 ENCOUNTER — Telehealth: Payer: Self-pay | Admitting: Licensed Clinical Social Worker

## 2015-12-20 NOTE — Telephone Encounter (Signed)
CSW receiving voice mail from Ms. Zenor. Pt states her aide services was discontinue based on assessment of ADL's.   CSW placed called to pt.  CSW left message requesting return call. CSW provided contact hours and phone number.

## 2015-12-22 ENCOUNTER — Ambulatory Visit: Payer: Medicare Other | Admitting: Dietician

## 2015-12-22 NOTE — Telephone Encounter (Signed)
CSW placed called to pt.  CSW left message requesting return call. CSW provided contact hours and phone number.  No response back from patient at this time.

## 2015-12-30 ENCOUNTER — Encounter (HOSPITAL_COMMUNITY): Payer: Self-pay | Admitting: Emergency Medicine

## 2015-12-30 ENCOUNTER — Emergency Department (HOSPITAL_COMMUNITY)
Admission: EM | Admit: 2015-12-30 | Discharge: 2015-12-30 | Disposition: A | Discharge status: Home / Self Care | Payer: Medicare Other | Attending: Emergency Medicine | Admitting: Emergency Medicine

## 2015-12-30 DIAGNOSIS — I509 Heart failure, unspecified: Secondary | ICD-10-CM | POA: Insufficient documentation

## 2015-12-30 DIAGNOSIS — Z7901 Long term (current) use of anticoagulants: Secondary | ICD-10-CM | POA: Diagnosis not present

## 2015-12-30 DIAGNOSIS — Z96641 Presence of right artificial hip joint: Secondary | ICD-10-CM | POA: Insufficient documentation

## 2015-12-30 DIAGNOSIS — Z91010 Allergy to peanuts: Secondary | ICD-10-CM | POA: Diagnosis not present

## 2015-12-30 DIAGNOSIS — I130 Hypertensive heart and chronic kidney disease with heart failure and stage 1 through stage 4 chronic kidney disease, or unspecified chronic kidney disease: Secondary | ICD-10-CM | POA: Diagnosis not present

## 2015-12-30 DIAGNOSIS — I2510 Atherosclerotic heart disease of native coronary artery without angina pectoris: Secondary | ICD-10-CM | POA: Diagnosis not present

## 2015-12-30 DIAGNOSIS — L03213 Periorbital cellulitis: Secondary | ICD-10-CM | POA: Insufficient documentation

## 2015-12-30 DIAGNOSIS — N183 Chronic kidney disease, stage 3 (moderate): Secondary | ICD-10-CM | POA: Diagnosis not present

## 2015-12-30 DIAGNOSIS — J45909 Unspecified asthma, uncomplicated: Secondary | ICD-10-CM | POA: Diagnosis not present

## 2015-12-30 DIAGNOSIS — H578 Other specified disorders of eye and adnexa: Secondary | ICD-10-CM | POA: Diagnosis present

## 2015-12-30 MED ORDER — CLINDAMYCIN HCL 300 MG PO CAPS: 300.0000 mg | ORAL_CAPSULE | Freq: Four times a day (QID) | ORAL | 0 refills | Status: DC

## 2015-12-30 NOTE — Discharge Instructions (Signed)
Read the information below.  Use the prescribed medication as directed.  Please discuss all new medications with your pharmacist.  You may return to the Emergency Department at any time for worsening condition or any new symptoms that concern you.    If you develop worsening pain in your eye, change in your vision, increased swelling around your eye, difficulty moving your eye, or fevers greater than 100.4, see your eye doctor or return to the Emergency Department immediately for a recheck.

## 2015-12-30 NOTE — ED Provider Notes (Signed)
[facility]-EM[facility]ENCY DEPT Provider Note   CSN: [id number] Arrival date & time: 12/30/15  1013     History   Chief Complaint Chief Complaint  Patient presents with  . Facial Swelling    HPI Vanessa Palmer is a 65 y.o. female.  HPI   Patient presents with left eyelid edema that began today.  States she woke up with a small knot over her left eyebrow that has progressed to involve her entire eyelid.  She used benadryl and ice without improvement.  She did have a pruritic rash on her left arm this morning as well, but notes she has these frequently (daily) and thinks it may be unrelated.  Denies any eye pain, itching of the eye or lid, vision changes, discharge from the eye, pain with moving her eyes, fevers, chills, myalgias.  Denies any itching or swelling in her mouth or throat.  Denies any trauma, known insect bites.   Denies changes in personal care products including detergents, soaps, shampoos, lotions, perfumes. Denies new clothing or furniture.  Denies travel, visiting other people's houses.  Denies any recent camping or time spent in the woods.  Denies chemical or plant exposures.  Denies new foods.  Denies any new medications or medication changes.   She is allergic to peanut butter and was near someone who was eating it last night but doubts any possibility of if touching or affecting her.    Past Medical History:  Diagnosis Date  . ALCOHOL ABUSE 12/13/2005   Annotation: Sober since 11/06 Qualifier: Diagnosis of  By: Wallace CullensGray MD, Natalia LeatherwoodKatherine    . Anemia   . CAD (coronary artery disease)    s/p non-Q wave MI in 06/2001 and 2004, 2005  . CHF (congestive heart failure) (HCC)   . Chronic kidney disease (CKD), stage III (moderate) 09/19/2010  . Depression   . DJD (degenerative joint disease)   . DVT of lower extremity, bilateral (HCC) 04/04/2012   On Xarelto    . GERD (gastroesophageal reflux disease)   . Gout   . Headache    migraines  . Hyperlipidemia   . Hypertension   . INTRINSIC  ASTHMA, WITH EXACERBATION 09/27/2009   Qualifier: Diagnosis of  By: Denton MeekKarimova MD, Tillie RungNodira    . Obesity   . OSA (obstructive sleep apnea)    previously used CPAP, has lost  . Seizures (HCC)    As a teenager     Patient Active Problem List   Diagnosis Date Noted  . Fatigue 11/23/2015  . Nausea and vomiting 11/23/2015  . Angioedema 12/30/2014  . Status post total replacement of right hip 11/23/2014  . Osteoarthritis of right hip 10/13/2014  . Miliaria 07/16/2012  . DVT of lower extremity, bilateral (HCC) 04/04/2012  . Routine health maintenance 01/14/2012  . GERD (gastroesophageal reflux disease) 01/09/2012  . Urinary incontinence 12/31/2011  . Chronic kidney disease (CKD), stage III (moderate) 09/19/2010  . Pes planus, congenital 08/08/2010  . Chronic pelvic pain in female 02/03/2010  . Asthma, chronic 09/27/2009  . Elevated alkaline phosphatase level 08/01/2009  . Pain in joint, multiple sites 12/21/2008  . MAJOR DPRSV DISORDER RECURRENT EPISODE MODERATE 09/08/2008  . Gout 07/05/2008  . Hyperlipidemia 05/07/2006  . Coronary atherosclerosis 02/05/2006  . Morbid obesity (HCC) 12/13/2005  . History of alcohol abuse 12/13/2005  . SLEEP APNEA, OBSTRUCTIVE 12/13/2005  . Essential hypertension 12/13/2005    Past Surgical History:  Procedure Laterality Date  . CARDIAC CATHETERIZATION     06/2001, 02/2002, 10/2004 (10-15% proximal  stenosis of circumflex, diffuse disease of  OM1 and RCA)  . COLONOSCOPY    . KNEE ARTHROSCOPY Left   . PARTIAL HYSTERECTOMY     due to endometriosis  . TOTAL HIP ARTHROPLASTY Right 11/23/2014   Procedure: RIGHT TOTAL HIP ARTHROPLASTY ANTERIOR APPROACH;  Surgeon: Kathryne Hitchhristopher Y Blackman, MD;  [facility];  Service: Orthopedics;  Laterality: Right;  . TUBAL LIGATION      OB History    No data available       Home Medications    Prior to Admission medications   Medication Sig Start Date End Date Taking? Authorizing Provider  acetaminophen (TYLENOL)  500 MG tablet Take 1,000 mg by mouth every 6 (six) hours as needed for moderate pain.    Historical Provider, MD  amLODipine (NORVASC) 5 MG tablet Take 5 mg by mouth daily.  04/11/15   Historical Provider, MD  atorvastatin (LIPITOR) 40 MG tablet TAKE 1 TABLET (40 MG TOTAL) BY MOUTH DAILY. 11/22/15   Inez CatalinaEmily B Mullen, MD  buPROPion (WELLBUTRIN XL) 150 MG 24 hr tablet Take 1 tablet (150 mg total) by mouth every morning. 08/11/14   Inez CatalinaEmily B Mullen, MD  clindamycin (CLEOCIN) 300 MG capsule Take 1 capsule (300 mg total) by mouth 4 (four) times daily. X 7 days 12/30/15   Trixie DredgeEmily Easter Kennebrew, PA-C  famotidine (PEPCID) 20 MG tablet Take 1 tablet (20 mg total) by mouth 2 (two) times daily. 08/11/14   Inez CatalinaEmily B Mullen, MD  fluticasone (FLONASE) 50 MCG/ACT nasal spray INSTILL TWO   SPRAYS IN EACH NOSTRIL ONCE DAILY Patient taking differently: INSTILL TWO   SPRAYS IN EACH NOSTRIL ONCE DAILY AS NEEDED FOR ALLERGIES 11/22/15   Inez CatalinaEmily B Mullen, MD  fluticasone (FLOVENT HFA) 44 MCG/ACT inhaler Inhale 2 puffs into the lungs 2 (two) times daily. Patient taking differently: Inhale 2 puffs into the lungs 2 (two) times daily as needed (shortness of breath).  06/02/13   Pasty Spillersracy N McLean-Scocuzza, MD  furosemide (LASIX) 20 MG tablet TAKE TWO   TABLETS (40 MG TOTAL) BY MOUTH DAILY. 08/07/15   Inez CatalinaEmily B Mullen, MD  metoprolol (LOPRESSOR) 50 MG tablet TAKE 1 TABLET (50 MG TOTAL) BY MOUTH DAILY. 08/07/15   Inez CatalinaEmily B Mullen, MD  nitroGLYCERIN (NITROSTAT) 0.4 MG SL tablet Place 0.4 mg under the tongue every 5 (five) minutes as needed for chest pain.    Historical Provider, MD  omeprazole (PRILOSEC) 40 MG capsule Take 1 capsule (40 mg total) by mouth daily. 11/23/15   Inez CatalinaEmily B Mullen, MD  ondansetron (ZOFRAN) 4 MG tablet Take 1 tablet (4 mg total) by mouth every 8 (eight) hours as needed for nausea or vomiting. 11/23/15   Inez CatalinaEmily B Mullen, MD  oxybutynin (DITROPAN) 5 MG tablet Take 1 tablet (5 mg total) by mouth 2 (two) times daily. 11/23/15   Inez CatalinaEmily B Mullen, MD    oxyCODONE-acetaminophen (PERCOCET) 10-325 MG tablet Take 1 tablet by mouth every 8 (eight) hours as needed for pain. 11/23/15 10/30/16  Inez CatalinaEmily B Mullen, MD  ranitidine (ZANTAC) 150 MG tablet Take 1 tablet (150 mg total) by mouth 2 (two) times daily. Patient taking differently: Take 150 mg by mouth 2 (two) times daily as needed for heartburn.  11/01/14   Angelina Okyan Franasiak, MD  rivaroxaban (XARELTO) 20 MG TABS tablet Take 1 tablet (20 mg total) by mouth daily with supper. 06/13/15   Doneen PoissonLawrence Klima, MD  tiZANidine (ZANAFLEX) 4 MG tablet Take 1 tablet (4 mg total) by mouth every 6 (six) hours  as needed for muscle spasms. 11/26/14   Kathryne Hitch, MD  traZODone (DESYREL) 50 MG tablet TAKE 0.5 TABLETS (25 MG TOTAL) BY MOUTH AT BEDTIME. 04/30/15   Inez Catalina, MD    Family History Family History  Problem Relation Age of Onset  . Mental illness Mother   . Heart disease Father   . Diabetes Sister   . Diabetes Sister   . Diabetes Sister     Social History Social History  Substance Use Topics  . Smoking status: Never Smoker  . Smokeless tobacco: Never Used  . Alcohol use No     Comment: last alcohol use 02/27/14     Allergies   Ace inhibitors; Lexiscan [regadenoson]; Peanuts [peanut oil]; Hydromorphone hcl; Percocet [oxycodone-acetaminophen]; Propoxyphene n-acetaminophen; and Vicodin [hydrocodone-acetaminophen]   Review of Systems Review of Systems  Constitutional: Negative for chills and fever.  HENT: Negative for congestion, dental problem, ear pain, mouth sores, rhinorrhea, sore throat and trouble swallowing.   Eyes: Negative for discharge.  Respiratory: Negative for cough, shortness of breath, wheezing and stridor.   Cardiovascular: Negative for chest pain.  Musculoskeletal: Negative for neck pain and neck stiffness.     Physical Exam Updated Vital Signs BP 157/75   Pulse 61   Temp 98.5 F (36.9 C) (Oral)   Resp 18   SpO2 100%   Physical Exam  Constitutional: She appears  well-developed and well-nourished. No distress.  HENT:  Head: Normocephalic and atraumatic.  Mouth/Throat: Oropharynx is clear and moist. No oropharyngeal exudate.  Eyes: Conjunctivae and EOM are normal. Pupils are equal, round, and reactive to light. Right eye exhibits no discharge. Left eye exhibits no discharge. Right conjunctiva is not injected. Right conjunctiva has no hemorrhage. Left conjunctiva is not injected. Left conjunctiva has no hemorrhage. Left eye exhibits normal extraocular motion and no nystagmus.  Left eyelid with diffuse edema, tenderness to palpation.    Neck: Normal range of motion. Neck supple.  Cardiovascular: Normal rate.   Pulmonary/Chest: Effort normal and breath sounds normal. No stridor.  Lymphadenopathy:    She has no cervical adenopathy.  Neurological: She is alert.  Skin: She is not diaphoretic.  Nursing note and vitals reviewed.    ED Treatments / Results  Labs (all labs ordered are listed, but only abnormal results are displayed) Labs Reviewed - No data to display  EKG  EKG Interpretation None       Radiology No results found.  Procedures Procedures (including critical care time)  Medications Ordered in ED Medications - No data to display   Initial Impression / Assessment and Plan / ED Course  I have reviewed the triage vital signs and the nursing notes.  Pertinent labs & imaging results that were available during my care of the patient were reviewed by me and considered in my medical decision making (see chart for details).  Clinical Course  Comment By Time  Mild swelling left eyelid.  No definite stye.  Mild ttp.  Suspect more infectious, inflammatory.  Doubt allergic.  No other systemic sx.  No right eye involvement. Linwood Dibbles, MD 11/03 1147    Afebrile, nontoxic patient with left eyelid edema and tenderness.  No involvement of globe or conjunctiva, no discharge.  No other allergic-type symptoms.  Will treat with antibiotics.   D/C  home with 2 day follow up with PCP, return precautions.  Discussed result, findings, treatment, and follow up  with patient.  Pt given return precautions.  Pt verbalizes understanding and agrees  with plan.       Final Clinical Impressions(s) / ED Diagnoses   Final diagnoses:  Preseptal cellulitis of left eye    New Prescriptions Discharge Medication List as of 12/30/2015 11:51 AM    START taking these medications   Details  clindamycin (CLEOCIN) 300 MG capsule Take 1 capsule (300 mg total) by mouth 4 (four) times daily. X 7 days, Starting Fri 12/30/2015, Print         [place], [place] 12/30/15 1336    Linwood Dibbles, MD 12/31/15 [phone]

## 2015-12-30 NOTE — ED Triage Notes (Signed)
Pt sts felt knot on her head this am with some swelling now swelling in left eye

## 2016-01-16 ENCOUNTER — Telehealth: Payer: Self-pay | Admitting: Dietician

## 2016-01-16 NOTE — Telephone Encounter (Signed)
Called patient to follow up on her healthy eating behavior changes: She says she is "not doing good, took her aide, won't give her a chair. trying to vacuum, cannot get around as well as she'd like to. Now is tired and upset". Is waiting to hear back from the person who she called that can help her.  She did not identify needs from our office at this time and said she'd schedule an apopintment after the holidays.

## 2016-01-23 ENCOUNTER — Ambulatory Visit (INDEPENDENT_AMBULATORY_CARE_PROVIDER_SITE_OTHER): Payer: Medicare Other | Admitting: Physician Assistant

## 2016-01-23 ENCOUNTER — Ambulatory Visit (INDEPENDENT_AMBULATORY_CARE_PROVIDER_SITE_OTHER): Payer: Medicare Other

## 2016-01-23 DIAGNOSIS — M5441 Lumbago with sciatica, right side: Secondary | ICD-10-CM | POA: Diagnosis not present

## 2016-01-23 MED ORDER — METHYLPREDNISOLONE 4 MG PO TABS: ORAL_TABLET | ORAL | 0 refills | Status: DC

## 2016-01-23 MED ORDER — METHOCARBAMOL 500 MG PO TABS: 500.0000 mg | ORAL_TABLET | Freq: Three times a day (TID) | ORAL | 0 refills | Status: DC | PRN

## 2016-01-23 NOTE — Progress Notes (Signed)
Office Visit Note   Patient: Vanessa Palmer           Date of Birth: 08-06-1950           MRN: [id number] Visit Date: 01/23/2016              Requested by: Inez CatalinaEmily B Mullen, MD [address] [place], [place] [address] PCP: Debe CoderMULLEN, EMILY, MD   Assessment & Plan: Visit Diagnoses:  1. Acute right-sided low back pain with right-sided sciatica     Plan: Physical therapy for home exercise program, core strengthening, modalities and stretching exercises. See her back in 2 weeks pain is not resolving consider MRI to rule HNP. Moist heat to the lower lumbar region. Medrol Dosepak and Robaxin  Follow-Up Instructions: Return in about 2 weeks (around 02/06/2016).   Orders:  Orders Placed This Encounter  Procedures  . XR Lumbar Spine 2-3 Views   Meds ordered this encounter  Medications  . methylPREDNISolone (MEDROL) 4 MG tablet    Sig: TAKE AS DIRECTED.    Dispense:  21 tablet    Refill:  0  . methocarbamol (ROBAXIN) 500 MG tablet    Sig: Take 1 tablet (500 mg total) by mouth every 8 (eight) hours as needed for muscle spasms.    Dispense:  40 tablet    Refill:  0      Procedures: No procedures performed   Clinical Data: No additional findings.   Subjective: Chief Complaint  Patient presents with  . Lower Back - Pain    Patient here today complaining of right sided buttocks pain. She states about 1 month ago she tripped in her house, she didn't fall on anything; just "twisted wrong". She has been having a lot of sciatica type pain and lower back pain since. Pain radiates down to just below the knee. She denies any real numbness or tingling.    Review of Systems See HPI  Objective: Vital Signs: There were no vitals taken for this visit.  Physical Exam  Constitutional: She is oriented to person, place, and time. She appears well-developed and well-nourished.  Cardiovascular: Intact distal pulses.   Neurological: She is alert and oriented to person, place, and time.    Psychiatric: She has a normal mood and affect.    Back Exam   Tenderness  The patient is experiencing tenderness in the lumbar.  Muscle Strength  Right Quadriceps:  5/5  Left Quadriceps:  5/5  Right Hamstrings:  5/5  Left Hamstrings:  5/5   Tests  Straight leg raise right: positive Straight leg raise left: negative  Reflexes  Patellar: normal Achilles: normal  Comments:  She is able to come within 6 inches of touching her toes has limited extension of lumbar spine. He has good range of motion of both hips without pain      Specialty Comments:  No specialty comments available.  Imaging: Xr Lumbar Spine 2-3 Views  Result Date: 01/23/2016 Lumbar spine 2 views: No acute fracture. Loss of the normal lordotic curvature. Decreased disc space at L4-5. Grade 1 anterior spondylolisthesis L4 on 5. Otherwise no bony abnormalities.    PMFS History: Patient Active Problem List   Diagnosis Date Noted  . Fatigue 11/23/2015  . Nausea and vomiting 11/23/2015  . Angioedema 12/30/2014  . Status post total replacement of right hip 11/23/2014  . Osteoarthritis of right hip 10/13/2014  . Miliaria 07/16/2012  . DVT of lower extremity, bilateral (HCC) 04/04/2012  . Routine health maintenance 01/14/2012  .  GERD (gastroesophageal reflux disease) 01/09/2012  . Urinary incontinence 12/31/2011  . Chronic kidney disease (CKD), stage III (moderate) 09/19/2010  . Pes planus, congenital 08/08/2010  . Chronic pelvic pain in female 02/03/2010  . Asthma, chronic 09/27/2009  . Elevated alkaline phosphatase level 08/01/2009  . Pain in joint, multiple sites 12/21/2008  . MAJOR DPRSV DISORDER RECURRENT EPISODE MODERATE 09/08/2008  . Gout 07/05/2008  . Hyperlipidemia 05/07/2006  . Coronary atherosclerosis 02/05/2006  . Morbid obesity (HCC) 12/13/2005  . History of alcohol abuse 12/13/2005  . SLEEP APNEA, OBSTRUCTIVE 12/13/2005  . Essential hypertension 12/13/2005   Past Medical History:   Diagnosis Date  . ALCOHOL ABUSE 12/13/2005   Annotation: Sober since 11/06 Qualifier: Diagnosis of  By: Wallace CullensGray MD, Natalia LeatherwoodKatherine    . Anemia   . CAD (coronary artery disease)    s/p non-Q wave MI in 06/2001 and 2004, 2005  . CHF (congestive heart failure) (HCC)   . Chronic kidney disease (CKD), stage III (moderate) 09/19/2010  . Depression   . DJD (degenerative joint disease)   . DVT of lower extremity, bilateral (HCC) 04/04/2012   On Xarelto    . GERD (gastroesophageal reflux disease)   . Gout   . Headache    migraines  . Hyperlipidemia   . Hypertension   . INTRINSIC ASTHMA, WITH EXACERBATION 09/27/2009   Qualifier: Diagnosis of  By: Denton MeekKarimova MD, Tillie RungNodira    . Obesity   . OSA (obstructive sleep apnea)    previously used CPAP, has lost  . Seizures (HCC)    As a teenager     Family History  Problem Relation Age of Onset  . Mental illness Mother   . Heart disease Father   . Diabetes Sister   . Diabetes Sister   . Diabetes Sister     Past Surgical History:  Procedure Laterality Date  . CARDIAC CATHETERIZATION     06/2001, 02/2002, 10/2004 (10-15% proximal stenosis of circumflex, diffuse disease of  OM1 and RCA)  . COLONOSCOPY    . KNEE ARTHROSCOPY Left   . PARTIAL HYSTERECTOMY     due to endometriosis  . TOTAL HIP ARTHROPLASTY Right 11/23/2014   Procedure: RIGHT TOTAL HIP ARTHROPLASTY ANTERIOR APPROACH;  Surgeon: Kathryne Hitchhristopher Y Blackman, MD;  [facility];  Service: Orthopedics;  Laterality: Right;  . TUBAL LIGATION     Social History   Occupational History  . retired Unemployed    previously worked as a Hospital doctorroom intendent   Social History Main Topics  . Smoking status: Never Smoker  . Smokeless tobacco: Never Used  . Alcohol use No     Comment: last alcohol use 02/27/14  . Drug use: No  . Sexual activity: Not Currently

## 2016-01-26 DIAGNOSIS — J45901 Unspecified asthma with (acute) exacerbation: Secondary | ICD-10-CM | POA: Diagnosis not present

## 2016-01-26 DIAGNOSIS — R609 Edema, unspecified: Secondary | ICD-10-CM | POA: Diagnosis not present

## 2016-01-26 DIAGNOSIS — M1990 Unspecified osteoarthritis, unspecified site: Secondary | ICD-10-CM | POA: Diagnosis not present

## 2016-02-03 ENCOUNTER — Ambulatory Visit: Payer: Medicare Other | Attending: Physician Assistant | Admitting: Physical Therapy

## 2016-02-03 DIAGNOSIS — M6281 Muscle weakness (generalized): Secondary | ICD-10-CM | POA: Diagnosis not present

## 2016-02-03 DIAGNOSIS — R2689 Other abnormalities of gait and mobility: Secondary | ICD-10-CM | POA: Diagnosis not present

## 2016-02-03 DIAGNOSIS — G8929 Other chronic pain: Secondary | ICD-10-CM | POA: Diagnosis not present

## 2016-02-03 DIAGNOSIS — M5441 Lumbago with sciatica, right side: Secondary | ICD-10-CM | POA: Insufficient documentation

## 2016-02-03 NOTE — Patient Instructions (Signed)
From drawer, gave lower trunk rotation, seated hamstring stretch and single knee to chest

## 2016-02-03 NOTE — Therapy (Signed)
[facility] [address] [place], [place], [address] Phone: [phone]   Fax:  [phone]  Physical Therapy Evaluation  Patient Details  Name: Vanessa Palmer D Hertzog MRN: [id number] Date of Birth: 09-Apr-1950 Referring Provider: Richardean CanalGilbert Clark, PA  Encounter Date: 02/03/2016      PT End of Session - 02/03/16 1143    Visit Number 1   Number of Visits 16   Date for PT Re-Evaluation 03/30/16   PT Start Time 1102   PT Stop Time 1146   PT Time Calculation (min) 44 min   Activity Tolerance Patient limited by pain   Behavior During Therapy [facility] for tasks assessed/performed  tearful      Past Medical History:  Diagnosis Date  . ALCOHOL ABUSE 12/13/2005   Annotation: Sober since 11/06 Qualifier: Diagnosis of  By: Wallace CullensGray MD, Natalia LeatherwoodKatherine    . Anemia   . CAD (coronary artery disease)    s/p non-Q wave MI in 06/2001 and 2004, 2005  . CHF (congestive heart failure) (HCC)   . Chronic kidney disease (CKD), stage III (moderate) 09/19/2010  . Depression   . DJD (degenerative joint disease)   . DVT of lower extremity, bilateral (HCC) 04/04/2012   On Xarelto    . GERD (gastroesophageal reflux disease)   . Gout   . Headache    migraines  . Hyperlipidemia   . Hypertension   . INTRINSIC ASTHMA, WITH EXACERBATION 09/27/2009   Qualifier: Diagnosis of  By: Denton MeekKarimova MD, Tillie RungNodira    . Obesity   . OSA (obstructive sleep apnea)    previously used CPAP, has lost  . Seizures (HCC)    As a teenager     Past Surgical History:  Procedure Laterality Date  . CARDIAC CATHETERIZATION     06/2001, 02/2002, 10/2004 (10-15% proximal stenosis of circumflex, diffuse disease of  OM1 and RCA)  . COLONOSCOPY    . KNEE ARTHROSCOPY Left   . PARTIAL HYSTERECTOMY     due to endometriosis  . TOTAL HIP ARTHROPLASTY Right 11/23/2014   Procedure: RIGHT TOTAL HIP ARTHROPLASTY ANTERIOR APPROACH;  Surgeon: Kathryne Hitchhristopher Y Blackman, MD;  [facility];  Service: Orthopedics;   Laterality: Right;  . TUBAL LIGATION      There were no vitals filed for this visit.       Subjective Assessment - 02/03/16 1107    Subjective Patient presents with pain in low back and Rt. LE which she reports has been ongoing a few months.  Has worsened to where it radiates to post thigh.  She reports weakness in Rt. LE , buckles occasionally. Endorses sensory changes in Rt. LE.  She has difficulty walking, standing, bending, housework (vacuuming), sleeping.  Wakes from pain.    Pertinent History HTN, CHF, Kidney disease, Rt. THR 10/2014 Dr. Magnus IvanBlackman   Limitations Sitting;Lifting;Standing;Walking;House hold activities;Other (comment)  sleep    How long can you sit comfortably? < 20 min    How long can you stand comfortably? 7 min or so    How long can you walk comfortably? 5-10 min    Diagnostic tests 01/23/16: XR: Decreased disc space at L4-5. Grade 1 anterior sppondylolisthesis    Patient Stated Goals Pt would like to maintain her home and move better    Currently in Pain? Yes   Pain Score 5    Pain Location Back   Pain Orientation Right;Lower;Left   Pain Descriptors / Indicators Stabbing;Pressure;Constant;Sore   Pain Type Chronic pain   Pain Radiating Towards  Rt. hip    Pain Onset More than a month ago   Pain Frequency Constant   Aggravating Factors  standing, bending    Pain Relieving Factors Pain meds, heating pad, laying on L side    Effect of Pain on Daily Activities Difficult to do basic ADLs, stand and walk             [facility] PT Assessment - 02/03/16 1115      Assessment   Medical Diagnosis lumbar radiculopathy Rt. leg   Referring Provider Richardean CanalGilbert Clark, PA   Prior Therapy Unsure      Precautions   Precautions None     Restrictions   Weight Bearing Restrictions No     Balance Screen   Has the patient fallen in the past 6 months Yes   How many times? 4   Has the patient had a decrease in activity level because of a fear of falling?  Yes   Is the patient  reluctant to leave their home because of a fear of falling?  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      Cognition   Overall Cognitive Status Within Functional Limits for tasks assessed     Observation/Other Assessments   Focus on Therapeutic Outcomes (FOTO)  NT     Posture/Postural Control   Posture/Postural Control Postural limitations   Postural Limitations Rounded Shoulders;Forward head;Flexed trunk     AROM   Lumbar Flexion 75%   Lumbar Extension 100%   Lumbar - Right Side Bend unable in standing    Lumbar - Left Side Bend unable    Lumbar - Right Rotation 75%   Lumbar - Left Rotation 75%     Strength   Right Hip Flexion 3-/5   Right Hip ABduction 2+/5   Left Hip Flexion 3/5   Left Hip ABduction 3-/5   Right Knee Flexion 4/5   Right Knee Extension 4/5   Left Knee Flexion 4/5   Left Knee Extension 4/5   Right Ankle Dorsiflexion 4/5   Left Ankle Dorsiflexion 4/5     Palpation   Spinal mobility unable to be assessed due to pain    Palpation comment guarded, not tolerated     Special Tests   Lumbar Tests Straight Leg Raise     Straight Leg Raise   Findings Positive   Side  Right   Comment bilateral, pt very tense      Bed Mobility   Bed Mobility Sit to Supine;Sit to Sidelying Right  pain    Sit to Supine 6: Modified independent (Device/Increase time)   Sit to Sidelying Right 6: Modified independent (Device/Increase time)     Transfers   Comments has PWC and RW      Ambulation/Gait   Ambulation/Gait Yes   Ambulation/Gait Assistance 6: Modified independent (Device/Increase time)    Ambulation Distance (Feet) 150 Feet   Assistive device Small based quad cane   Gait Pattern Antalgic   Ambulation Surface Level;Indoor  [facility] Adult PT Treatment/Exercise - 02/03/16 1115      Self-Care   Heat/Ice Application HP for relaxation, breathing    Other Self-Care Comments  HEP, verbal , gait with cane (changed side, was using on Rt. side)      Moist Heat Therapy   Number Minutes Moist Heat 12 Minutes   Moist Heat Location Lumbar Spine;Hip                PT Education - 02/03/16 1140    Education provided Yes   Education Details PT/POC, HEP and anatomy   Person(s) Educated Patient   Methods Explanation;Demonstration   Comprehension Verbalized understanding;Returned demonstration;Need further instruction          PT Short Term Goals - 02/03/16 1158      PT SHORT TERM GOAL #1   Title Pt will be I with initial HEP and baasic concepts of body mechanics, transfers.    Time 4   Period Weeks   Status New     PT SHORT TERM GOAL #2   Title Pt will tolerate supine with min increase in pain with mat Ex.    Time 4   Period Weeks   Status New     PT SHORT TERM GOAL #3   Title Pt will report less pain in Rt. LE (25% less) with ADLs.    Time 4   Period Weeks   Status New     PT SHORT TERM GOAL #4   Title Balance screen, goal to be set    Time 4   Period Weeks   Status New           PT Long Term Goals - 02/03/16 1159      PT LONG TERM GOAL #1   Title Pt will be I with HEP and posture, body mechanics.    Time 8   Period Weeks   Status New     PT LONG TERM GOAL #2   Title Pt will be able to do light housework, vacuum with min increase in back pain.    Time 8   Period Weeks   Status New     PT LONG TERM GOAL #3   Title Balance goal TBA    Time 8   Period Weeks   Status New     PT LONG TERM GOAL #4   Title Further goals to be set as pt progresses.    Time 8   Period Weeks   Status New                Plan - 02/03/16 1144    Clinical Impression Statement Patient with mod complexity eval for low back pain which has become more severe over the past few months.  She was unable to tolerate most of the evaluation due to pain.  She is limited in all aspects of mobility due to pain and chronic health issues.  She will benefit from PT and complete 8 weeks if improving.    Rehab Potential Good   PT Frequency 2x / week   PT Duration 8 weeks   PT Treatment/Interventions ADLs/Self Care Home Management;Moist Heat;Therapeutic activities;Therapeutic exercise;Ultrasound;Balance training;Manual techniques;Passive range of motion;Electrical Stimulation;Cryotherapy;Gait training;Functional mobility training;Patient/family education   PT Next Visit Plan HEP, modalities   PT Home Exercise Plan LTR, S KTC and seated HS   Recommended Other Services CNA in home, awaiting PWC.    Consulted and Agree with Plan of Care Patient  Patient will benefit from skilled therapeutic intervention in order to improve the following deficits and impairments:  Decreased range of motion, Difficulty walking, Obesity, Decreased endurance, Decreased activity tolerance, Pain, Improper body mechanics, Impaired flexibility, Decreased balance, Decreased mobility, Decreased strength, Increased edema, Postural dysfunction, Impaired sensation  Visit Diagnosis: Other abnormalities of gait and mobility  Chronic bilateral low back pain with right-sided sciatica  Muscle weakness (generalized)      G-Codes - 2016-02-12 07/13/00    Functional Assessment Tool Used clinical judgement    Functional Limitation Mobility: Walking and moving around   Mobility: Walking and Moving Around Current Status [phone]) At least 80 percent but less than 100 percent impaired, limited or restricted   Mobility: Walking and Moving Around Goal Status [phone]) At least 60 percent but less than 80 percent impaired, limited or restricted       Problem List Patient  Active Problem List   Diagnosis Date Noted  . Fatigue 11/23/2015  . Nausea and vomiting 11/23/2015  . Angioedema 12/30/2014  . Status post total replacement of right hip 11/23/2014  . Osteoarthritis of right hip 10/13/2014  . Miliaria 07/16/2012  . DVT of lower extremity, bilateral (HCC) 04/04/2012  . Routine health maintenance 01/14/2012  . GERD (gastroesophageal reflux disease) 01/09/2012  . Urinary incontinence 12/31/2011  . Chronic kidney disease (CKD), stage III (moderate) 09/19/2010  . Pes planus, congenital 08/08/2010  . Chronic pelvic pain in female 02/03/2010  . Asthma, chronic 09/27/2009  . Elevated alkaline phosphatase level 08/01/2009  . Pain in joint, multiple sites 12/21/2008  . MAJOR DPRSV DISORDER RECURRENT EPISODE MODERATE 09/08/2008  . Gout 07/05/2008  . Hyperlipidemia 05/07/2006  . Coronary atherosclerosis 02/05/2006  . Morbid obesity (HCC) 12/13/2005  . History of alcohol abuse 12/13/2005  . SLEEP APNEA, OBSTRUCTIVE 12/13/2005  . Essential hypertension 12/13/2005    Maciah Feeback 2016-02-12, 12:06 PM  [facility] [address] [place], [place], [address] Phone: [phone]   Fax:  [phone]  Name: RYANNE MORAND MRN: [id number] Date of Birth: [birth year]   Karie Mainland, PT February 12, 2016 12:06 PM Phone: [phone] Fax: [phone]

## 2016-02-06 ENCOUNTER — Ambulatory Visit (INDEPENDENT_AMBULATORY_CARE_PROVIDER_SITE_OTHER): Payer: Medicare Other | Admitting: Physician Assistant

## 2016-02-06 ENCOUNTER — Encounter (INDEPENDENT_AMBULATORY_CARE_PROVIDER_SITE_OTHER): Payer: Self-pay | Admitting: Physician Assistant

## 2016-02-06 VITALS — Ht 62.0 in | Wt 218.0 lb

## 2016-02-06 DIAGNOSIS — M5441 Lumbago with sciatica, right side: Secondary | ICD-10-CM

## 2016-02-06 NOTE — Progress Notes (Signed)
Office Visit Note   Patient: Vanessa Palmer           Date of Birth: 1951/01/25           MRN: [id number] Visit Date: 02/06/2016              Requested by: Inez CatalinaEmily B Mullen, MD [address] [place], [place] [address] PCP: Debe CoderMULLEN, EMILY, MD   Assessment & Plan: Visit Diagnoses:  1. Acute right-sided low back pain with right-sided sciatica     Plan: Continue physical therapy if pain persists does not improve in 2 weeks she can call and we'll obtain an MRI of her low back to rule out HNP as the source of her radicular symptoms down the right leg. Otherwise we'll see her back in 1 month for follow-up.  Follow-Up Instructions: Return in about 4 weeks (around 03/05/2016).   Orders:  No orders of the defined types were placed in this encounter.  No orders of the defined types were placed in this encounter.     Procedures: No procedures performed   Clinical Data: No additional findings.   Subjective: Chief Complaint  Patient presents with  . Lower Back - Pain, Follow-up    Patient returns with complains of low right sided back pain. States she has not seen much improvement. Went to her 1st physical therapy visit this past Friday. Is scheduled to have therapy for a month.     Review of Systems See HPI  Objective: Vital Signs: Ht 5\' 2"  (1.575 m)   Wt 218 lb (98.9 kg)   BMI 39.87 kg/m   Physical Exam  Constitutional: She is oriented to person, place, and time. She appears well-developed and well-nourished. No distress.  Pulmonary/Chest: Effort normal.  Neurological: She is alert and oriented to person, place, and time.  Psychiatric: She has a normal mood and affect.    Back Exam   Muscle Strength  Right Quadriceps:  5/5  Left Quadriceps:  5/5  Right Hamstrings:  5/5  Left Hamstrings:  5/5   Tests  Straight leg raise right: positive Straight leg raise left: negative    Lumbar spine:  tenderness in the right lower paraspinous region, has very limited  flexion and extension and lumbar spine.  Specialty Comments:  No specialty comments available.  Imaging: No results found.   PMFS History: Patient Active Problem List   Diagnosis Date Noted  . Fatigue 11/23/2015  . Nausea and vomiting 11/23/2015  . Angioedema 12/30/2014  . Status post total replacement of right hip 11/23/2014  . Osteoarthritis of right hip 10/13/2014  . Miliaria 07/16/2012  . DVT of lower extremity, bilateral (HCC) 04/04/2012  . Routine health maintenance 01/14/2012  . GERD (gastroesophageal reflux disease) 01/09/2012  . Urinary incontinence 12/31/2011  . Chronic kidney disease (CKD), stage III (moderate) 09/19/2010  . Pes planus, congenital 08/08/2010  . Chronic pelvic pain in female 02/03/2010  . Asthma, chronic 09/27/2009  . Elevated alkaline phosphatase level 08/01/2009  . Pain in joint, multiple sites 12/21/2008  . MAJOR DPRSV DISORDER RECURRENT EPISODE MODERATE 09/08/2008  . Gout 07/05/2008  . Hyperlipidemia 05/07/2006  . Coronary atherosclerosis 02/05/2006  . Morbid obesity (HCC) 12/13/2005  . History of alcohol abuse 12/13/2005  . SLEEP APNEA, OBSTRUCTIVE 12/13/2005  . Essential hypertension 12/13/2005   Past Medical History:  Diagnosis Date  . ALCOHOL ABUSE 12/13/2005   Annotation: Sober since 11/06 Qualifier: Diagnosis of  By: Wallace CullensGray MD, Natalia LeatherwoodKatherine    . Anemia   .  CAD (coronary artery disease)    s/p non-Q wave MI in 06/2001 and 2004, 2005  . CHF (congestive heart failure) (HCC)   . Chronic kidney disease (CKD), stage III (moderate) 09/19/2010  . Depression   . DJD (degenerative joint disease)   . DVT of lower extremity, bilateral (HCC) 04/04/2012   On Xarelto    . GERD (gastroesophageal reflux disease)   . Gout   . Headache    migraines  . Hyperlipidemia   . Hypertension   . INTRINSIC ASTHMA, WITH EXACERBATION 09/27/2009   Qualifier: Diagnosis of  By: Denton MeekKarimova MD, Tillie RungNodira    . Obesity   . OSA (obstructive sleep apnea)    previously used  CPAP, has lost  . Seizures (HCC)    As a teenager     Family History  Problem Relation Age of Onset  . Mental illness Mother   . Heart disease Father   . Diabetes Sister   . Diabetes Sister   . Diabetes Sister     Past Surgical History:  Procedure Laterality Date  . CARDIAC CATHETERIZATION     06/2001, 02/2002, 10/2004 (10-15% proximal stenosis of circumflex, diffuse disease of  OM1 and RCA)  . COLONOSCOPY    . KNEE ARTHROSCOPY Left   . PARTIAL HYSTERECTOMY     due to endometriosis  . TOTAL HIP ARTHROPLASTY Right 11/23/2014   Procedure: RIGHT TOTAL HIP ARTHROPLASTY ANTERIOR APPROACH;  Surgeon: Kathryne Hitchhristopher Y Blackman, MD;  [facility];  Service: Orthopedics;  Laterality: Right;  . TUBAL LIGATION     Social History   Occupational History  . retired Unemployed    previously worked as a Hospital doctorroom intendent   Social History Main Topics  . Smoking status: Never Smoker  . Smokeless tobacco: Never Used  . Alcohol use No     Comment: last alcohol use 02/27/14  . Drug use: No  . Sexual activity: Not Currently

## 2016-02-09 ENCOUNTER — Other Ambulatory Visit: Payer: Self-pay | Admitting: Internal Medicine

## 2016-02-14 ENCOUNTER — Ambulatory Visit: Payer: Medicare Other | Admitting: Physical Therapy

## 2016-02-14 DIAGNOSIS — M6281 Muscle weakness (generalized): Secondary | ICD-10-CM

## 2016-02-14 DIAGNOSIS — R2689 Other abnormalities of gait and mobility: Secondary | ICD-10-CM | POA: Diagnosis not present

## 2016-02-14 DIAGNOSIS — M5441 Lumbago with sciatica, right side: Secondary | ICD-10-CM

## 2016-02-14 DIAGNOSIS — G8929 Other chronic pain: Secondary | ICD-10-CM

## 2016-02-14 NOTE — Therapy (Signed)
[facility] [address] [place], [place], [address] Phone: [phone]   Fax:  [phone]  Physical Therapy Treatment  Patient Details  Name: Vanessa Palmer D Rumberger MRN: [id number] Date of Birth: [birth year] Referring Provider: Richardean CanalGilbert Clark, PA  Encounter Date: 02/14/2016      PT End of Session - 02/14/16 1426    Visit Number 2   Number of Visits 16   Date for PT Re-Evaluation 03/30/16   PT Start Time 1015   PT Stop Time 1106   PT Time Calculation (min) 51 min   Activity Tolerance Patient tolerated treatment well   Behavior During Therapy [facility] for tasks assessed/performed      Past Medical History:  Diagnosis Date  . ALCOHOL ABUSE 12/13/2005   Annotation: Sober since 11/06 Qualifier: Diagnosis of  By: Wallace CullensGray MD, Natalia LeatherwoodKatherine    . Anemia   . CAD (coronary artery disease)    s/p non-Q wave MI in 06/2001 and 2004, 2005  . CHF (congestive heart failure) (HCC)   . Chronic kidney disease (CKD), stage III (moderate) 09/19/2010  . Depression   . DJD (degenerative joint disease)   . DVT of lower extremity, bilateral (HCC) 04/04/2012   On Xarelto    . GERD (gastroesophageal reflux disease)   . Gout   . Headache    migraines  . Hyperlipidemia   . Hypertension   . INTRINSIC ASTHMA, WITH EXACERBATION 09/27/2009   Qualifier: Diagnosis of  By: Denton MeekKarimova MD, Tillie RungNodira    . Obesity   . OSA (obstructive sleep apnea)    previously used CPAP, has lost  . Seizures (HCC)    As a teenager     Past Surgical History:  Procedure Laterality Date  . CARDIAC CATHETERIZATION     06/2001, 02/2002, 10/2004 (10-15% proximal stenosis of circumflex, diffuse disease of  OM1 and RCA)  . COLONOSCOPY    . KNEE ARTHROSCOPY Left   . PARTIAL HYSTERECTOMY     due to endometriosis  . TOTAL HIP ARTHROPLASTY Right 11/23/2014   Procedure: RIGHT TOTAL HIP ARTHROPLASTY ANTERIOR APPROACH;  Surgeon: Kathryne Hitchhristopher Y Blackman, MD;  [facility];  Service: Orthopedics;   Laterality: Right;  . TUBAL LIGATION      There were no vitals filed for this visit.      Subjective Assessment - 02/14/16 1020    Subjective Patien reports no changes in her back pain. She is having about 6/10 pain going down into her right leg. She reports she had no problem with her exercises.    Pertinent History HTN, CHF, Kidney disease, Rt. THR 10/2014 Dr. Magnus IvanBlackman   Limitations Sitting;Lifting;Standing;Walking;House hold activities;Other (comment)   How long can you sit comfortably? < 20 min    How long can you stand comfortably? 7 min or so    How long can you walk comfortably? 5-10 min    Diagnostic tests 01/23/16: XR: Decreased disc space at L4-5. Grade 1 anterior sppondylolisthesis    Currently in Pain? Yes   Pain Score 6    Pain Location Back            [facility] - 02/14/16 0001      Balance   Balance Assessed Yes     Standardized Balance Assessment   Standardized Balance Assessment Berg Balance Test     Berg Balance Test   Sit to Stand Able to stand  independently using hands   Standing Unsupported Able to stand 30 seconds unsupported   Sitting  with Back Unsupported but Feet Supported on Floor or Stool Able to sit safely and securely 2 minutes   Stand to Sit Controls descent by using hands   Transfers Able to transfer safely, minor use of hands   Standing Unsupported with Eyes Closed Able to stand 10 seconds with supervision   Standing Ubsupported with Feet Together Needs help to attain position but able to stand for 30 seconds with feet together   From Standing, Reach Forward with Outstretched Arm Can reach forward >12 cm safely (5")   From Standing Position, Pick up Object from Floor Unable to try/needs assist to keep balance   From Standing Position, Turn to Look Behind Over each Shoulder Needs supervision when turning   Turn 360 Degrees Needs close supervision or verbal cueing   Standing Unsupported, Alternately Place Feet on Step/Stool Able to  complete >2 steps/needs minimal assist   Standing Unsupported, One Foot in Front Loses balance while stepping or standing   Standing on One Leg Unable to try or needs assist to prevent fall   Total Score 26                     [facility] Adult PT Treatment/Exercise - 02/14/16 0001      Lumbar Exercises: Seated   Other Seated Lumbar Exercises setaed hamstring stretch 2x20 sec each leg    Other Seated Lumbar Exercises seated ball squeeze with abdominal contraction 2x10; hip abduction with cuing dfor posture 2x10; seated shoulder flexion      Lumbar Exercises: Supine   Other Supine Lumbar Exercises ltr 2x10      Moist Heat Therapy   Number Minutes Moist Heat 10 Minutes   Moist Heat Location Lumbar Spine     Manual Therapy   Manual therapy comments attmepted passive left hip stretching and long axis distraction but patient had increased pin with all activity.                 PT Education - 02/14/16 1426    Education provided Yes   Education Details Continue with HEP    Person(s) Educated Patient   Methods Explanation;Demonstration   Comprehension Returned demonstration;Verbalized understanding;Need further instruction          PT Short Term Goals - 02/14/16 1427      PT SHORT TERM GOAL #1   Title Pt will be I with initial HEP and baasic concepts of body mechanics, transfers.    Time 4   Period Weeks   Status On-going     PT SHORT TERM GOAL #2   Title Pt will tolerate supine with min increase in pain with mat Ex.    Baseline pain when lying down   Time 4   Period Weeks   Status On-going     PT SHORT TERM GOAL #3   Title Pt will report less pain in Rt. LE (25% less) with ADLs.    Time 4   Period Weeks   Status New     PT SHORT TERM GOAL #4   Title Balance screen, goal to be set    Time 4   Period Weeks   Status On-going           PT Long Term Goals - 02/03/16 1159      PT LONG TERM GOAL #1   Title Pt will be I with HEP and posture, body  mechanics.    Time 8   Period Weeks   Status New  PT LONG TERM GOAL #2   Title Pt will be able to do light housework, vacuum with min increase in back pain.    Time 8   Period Weeks   Status New     PT LONG TERM GOAL #3   Title Balance goal TBA    Time 8   Period Weeks   Status New     PT LONG TERM GOAL #4   Title Further goals to be set as pt progresses.    Time 8   Period Weeks   Status New               Plan - 02/14/16 1035    Clinical Impression Statement Patient required moderate cuing for her home exercises. She was encouraged to do her exericses at home. She is a moderate fall risk per BERG balance scale. She had no increase in pain with treatment. Therapy added light hip strengthening and core strengthening. Max cuing required for technique with new exercises. Therappy attempted Manual therapy but the patient had difficutly lying on  wedge and difficulty releaxing her legs with stretching and light distraction.      Rehab Potential Good   PT Frequency 2x / week   PT Duration 8 weeks   PT Treatment/Interventions ADLs/Self Care Home Management;Moist Heat;Therapeutic activities;Therapeutic exercise;Ultrasound;Balance training;Manual techniques;Passive range of motion;Electrical Stimulation;Cryotherapy;Gait training;Functional mobility training;Patient/family education   PT Next Visit Plan HEP, modalities   PT Home Exercise Plan LTR, S KTC and seated HS   Consulted and Agree with Plan of Care Patient      Patient will benefit from skilled therapeutic intervention in order to improve the following deficits and impairments:  Decreased range of motion, Difficulty walking, Obesity, Decreased endurance, Decreased activity tolerance, Pain, Improper body mechanics, Impaired flexibility, Decreased balance, Decreased mobility, Decreased strength, Increased edema, Postural dysfunction, Impaired sensation  Visit Diagnosis: Other abnormalities of gait and mobility  Chronic  bilateral low back pain with right-sided sciatica  Muscle weakness (generalized)     Problem List Patient Active Problem List   Diagnosis Date Noted  . Fatigue 11/23/2015  . Nausea and vomiting 11/23/2015  . Angioedema 12/30/2014  . Status post total replacement of right hip 11/23/2014  . Osteoarthritis of right hip 10/13/2014  . Miliaria 07/16/2012  . DVT of lower extremity, bilateral (HCC) 04/04/2012  . Routine health maintenance 01/14/2012  . GERD (gastroesophageal reflux disease) 01/09/2012  . Urinary incontinence 12/31/2011  . Chronic kidney disease (CKD), stage III (moderate) 09/19/2010  . Pes planus, congenital 08/08/2010  . Chronic pelvic pain in female 02/03/2010  . Asthma, chronic 09/27/2009  . Elevated alkaline phosphatase level 08/01/2009  . Pain in joint, multiple sites 12/21/2008  . MAJOR DPRSV DISORDER RECURRENT EPISODE MODERATE 09/08/2008  . Gout 07/05/2008  . Hyperlipidemia 05/07/2006  . Coronary atherosclerosis 02/05/2006  . Morbid obesity (HCC) 12/13/2005  . History of alcohol abuse 12/13/2005  . SLEEP APNEA, OBSTRUCTIVE 12/13/2005  . Essential hypertension 12/13/2005    Dessie Comaavid J Lyndel Dancel  PT DPT  02/14/2016, 3:45 PM  [facility] [address] [place], [place], [address] Phone: [phone]   Fax:  [phone]  Name: Vanessa Palmer D Amon MRN: [id number] Date of Birth: 1950/11/24

## 2016-02-16 ENCOUNTER — Ambulatory Visit (INDEPENDENT_AMBULATORY_CARE_PROVIDER_SITE_OTHER): Payer: Medicare Other | Admitting: Internal Medicine

## 2016-02-16 ENCOUNTER — Encounter: Payer: Self-pay | Admitting: Internal Medicine

## 2016-02-16 VITALS — BP 133/74 | HR 56 | Temp 98.0°F | Wt 229.7 lb

## 2016-02-16 DIAGNOSIS — G8929 Other chronic pain: Secondary | ICD-10-CM

## 2016-02-16 DIAGNOSIS — M25551 Pain in right hip: Secondary | ICD-10-CM | POA: Diagnosis not present

## 2016-02-16 DIAGNOSIS — M5441 Lumbago with sciatica, right side: Secondary | ICD-10-CM | POA: Diagnosis not present

## 2016-02-16 DIAGNOSIS — Z79891 Long term (current) use of opiate analgesic: Secondary | ICD-10-CM

## 2016-02-16 DIAGNOSIS — M2550 Pain in unspecified joint: Secondary | ICD-10-CM | POA: Diagnosis not present

## 2016-02-16 DIAGNOSIS — Z0000 Encounter for general adult medical examination without abnormal findings: Secondary | ICD-10-CM

## 2016-02-16 DIAGNOSIS — D638 Anemia in other chronic diseases classified elsewhere: Secondary | ICD-10-CM | POA: Diagnosis not present

## 2016-02-16 DIAGNOSIS — R748 Abnormal levels of other serum enzymes: Secondary | ICD-10-CM

## 2016-02-16 DIAGNOSIS — D631 Anemia in chronic kidney disease: Secondary | ICD-10-CM

## 2016-02-16 DIAGNOSIS — M25552 Pain in left hip: Secondary | ICD-10-CM

## 2016-02-16 DIAGNOSIS — E7800 Pure hypercholesterolemia, unspecified: Secondary | ICD-10-CM

## 2016-02-16 DIAGNOSIS — I82403 Acute embolism and thrombosis of unspecified deep veins of lower extremity, bilateral: Secondary | ICD-10-CM

## 2016-02-16 DIAGNOSIS — N183 Chronic kidney disease, stage 3 unspecified: Secondary | ICD-10-CM

## 2016-02-16 DIAGNOSIS — J45909 Unspecified asthma, uncomplicated: Secondary | ICD-10-CM

## 2016-02-16 DIAGNOSIS — J4520 Mild intermittent asthma, uncomplicated: Secondary | ICD-10-CM

## 2016-02-16 DIAGNOSIS — N189 Chronic kidney disease, unspecified: Secondary | ICD-10-CM

## 2016-02-16 DIAGNOSIS — K219 Gastro-esophageal reflux disease without esophagitis: Secondary | ICD-10-CM

## 2016-02-16 DIAGNOSIS — I129 Hypertensive chronic kidney disease with stage 1 through stage 4 chronic kidney disease, or unspecified chronic kidney disease: Secondary | ICD-10-CM | POA: Diagnosis not present

## 2016-02-16 DIAGNOSIS — E785 Hyperlipidemia, unspecified: Secondary | ICD-10-CM

## 2016-02-16 DIAGNOSIS — I82503 Chronic embolism and thrombosis of unspecified deep veins of lower extremity, bilateral: Secondary | ICD-10-CM

## 2016-02-16 DIAGNOSIS — R32 Unspecified urinary incontinence: Secondary | ICD-10-CM

## 2016-02-16 DIAGNOSIS — Z79899 Other long term (current) drug therapy: Secondary | ICD-10-CM

## 2016-02-16 DIAGNOSIS — I10 Essential (primary) hypertension: Secondary | ICD-10-CM

## 2016-02-16 MED ORDER — OXYCODONE-ACETAMINOPHEN 10-325 MG PO TABS: 1.0000 | ORAL_TABLET | Freq: Three times a day (TID) | ORAL | 0 refills | Status: DC | PRN

## 2016-02-16 NOTE — Patient Instructions (Signed)
[name] -   Thank you for coming in to see me today.   For your chronic pain, remember if you continue to get refills of your percocet from us, you should not get any other narcotic from another doctor or clinic.  These include anything with codeine, hydrocodone, oxycodone, or any others.  Thank you for following our policy.   Please come back to see me in 4 months.  Sooner if needed.    Thank you!

## 2016-02-16 NOTE — Progress Notes (Signed)
   Subjective:    Patient ID: Vanessa Palmer, female    DOB: [birth year], 65 y.o.   MRN: [id number]  CC: 3 month follow up for HTN  HPI  [name] is a 65yo woman with PMH of asthma, CKD, HTN, GERD, HLD, incontinence, hip pain s/p total hip arthroplasty.    Since I last saw her, she was in the ED for RLQ pain with a CT abdomen which was normal. Thought to be MSK in nature.  Advised tylenol and heating pad.  She was then seen on 11/3 in the ED for preseptal cellulitis and had a course of clindamycin.    She was seen twice in orthopedics for right sided low back pain/sciatica.  They advised PT and an MRI of the lower back if pain did not improve.  They started her on a muscle relaxer - robaxin.  She is doing well with PT and is somewhat improved.  She is due to see orthopedics in the near future.    Today [name] reports that she is doing relatively well.  She has not had a fall.  She reports that her insurance will no longer pay for an aide, which upsets her.  We have gone through the process of getting her a new motorized wheelchair, but she notes that her insurance has not yet approved one.  [facility].    She does report some SOB with exercise, but she uses her inhaler and feels better.  She has moderately well controlled asthma. She has chronic LE swelling and has a history of DVT in both legs.  The swelling goes down with rest.  She has no warmth or erythema of her legs.    I noticed on review of her labwork that she has had a mildly low H/H for the last few checks.  This is likely related to her CKD.  Will check some blood work today to confirm.    Review of Systems  Constitutional: Negative for chills and fever.  Respiratory: Negative for cough and shortness of breath.   Cardiovascular: Positive for leg swelling. Negative for chest pain.  Genitourinary: Negative for difficulty urinating and dysuria.  Musculoskeletal: Positive for arthralgias, back pain  and gait problem.  Neurological: Positive for weakness. Negative for dizziness and light-headedness.       Objective:   Physical Exam  Constitutional: She is oriented to person, place, and time. She appears well-developed and well-nourished. No distress.  HENT:  Head: Normocephalic and atraumatic.  Cardiovascular: Normal rate, regular rhythm and normal heart sounds.   Pulmonary/Chest: Effort normal and breath sounds normal. No respiratory distress.  Abdominal: Soft. Bowel sounds are normal.  Musculoskeletal: She exhibits edema (1+ to calves, worse on the right.  ) and tenderness (to all palpation, on the legs or back.  No spasm that I could identify).  Neurological: She is alert and oriented to person, place, and time.  Psychiatric: She has a normal mood and affect. Her behavior is normal.    CBC Ferritin, iron today UDS today      Assessment & Plan:  RTC in 4 months

## 2016-02-17 LAB — IRON: IRON: 70 ug/dL (ref 27–139)

## 2016-02-17 LAB — CBC
HEMATOCRIT: 33.4 % — AB (ref 34.0–46.6)
HEMOGLOBIN: 11.5 g/dL (ref 11.1–15.9)
MCH: 30.9 pg (ref 26.6–33.0)
MCHC: 34.4 g/dL (ref 31.5–35.7)
MCV: 90 fL (ref 79–97)
Platelets: 195 10*3/uL (ref 150–379)
RBC: 3.72 x10E6/uL — AB (ref 3.77–5.28)
RDW: 13.9 % (ref 12.3–15.4)
WBC: 4.2 10*3/uL (ref 3.4–10.8)

## 2016-02-17 LAB — FERRITIN: Ferritin: 55 ng/mL (ref 15–150)

## 2016-02-17 NOTE — Assessment & Plan Note (Signed)
Last LDL < 50 in September of this year.   Plan Continue atorvastatin.

## 2016-02-17 NOTE — Assessment & Plan Note (Signed)
H/H mildly low and stable. Iron and ferritin normal.   Very mild, likely related to CKD.   Continue to monitor.

## 2016-02-17 NOTE — Assessment & Plan Note (Signed)
She uses flovent, but seems to only use it prn.  She does have SOB with walking and occasional wheezing.  This is worse when she is sick.   Plan Continue to stress need to take ICS daily to help with symptoms Consider refill albuterol if needed.

## 2016-02-17 NOTE — Assessment & Plan Note (Signed)
She has some chronic swelling, this is improved with lasix and with resting and putting her feet up. She had bilateral DVT on diagnosis, I wonder if she has post-phlebitis swelling.  We discussed this today.  I discussed compression stockings with her, but she was not interested at this time.    Plan Continue lasix Continue Xarelto Monitor for worsening swelling.

## 2016-02-17 NOTE — Assessment & Plan Note (Signed)
Continues mildly elevated, check GGT and vitamin D at next visit.   DEXT at 2665

## 2016-02-17 NOTE — Assessment & Plan Note (Signed)
She has 3 medications listed on her med list.  She reports thinking that she only takes pepcid (famotadine). Will d/c ranitidine and prilosec.  She reports good control of her symptoms with twice a day dosing.

## 2016-02-17 NOTE — Assessment & Plan Note (Signed)
She is doing well with oxybutynin.  She has no dizziness or dry mouth reported.  She wears adult diapers for some continued leakage, but otherwise has no new complaints today.   Plan Continue oxybutynin.

## 2016-02-17 NOTE — Assessment & Plan Note (Signed)
She has residual pain in the hips and now back pain which is being monitored by her surgeon and she is going to PT.  She had some robaxin which helped.  On exam, she is tender and flinches no matter where you touch her back.  She was able to walk in with a cane without issue.    She is due to see her surgeon again after PT for further evaluation.    She has chronic pains and takes oxycodone from this clinic.  [place] narcotic database showed a 1 time Rx from another provider in April of this year.  I addressed this with [name] today and reminded her of our policy.  She stated she will not get other narcotic prescriptions in future.   UDS today  3 month refill of pain medications.

## 2016-02-17 NOTE — Assessment & Plan Note (Signed)
She had a pap at a young age for uncontrolled bleeding, likely fibroids.  She has had negative pap smears since that time.  She likely does not need to be screened further.    Mammogram this year negative  DEXA at 7965.

## 2016-02-17 NOTE — Assessment & Plan Note (Signed)
BP 133/74 today which is at goal for her.   Renal function stable on check in October of this year.   Plan Continue metoprolol, amlodipine, lasix.

## 2016-02-21 ENCOUNTER — Ambulatory Visit: Payer: Medicare Other | Admitting: Physical Therapy

## 2016-02-21 IMAGING — CT CT ABD-PELV W/ CM
2 of 5 series · 16 of 46 positions shown, 18 images · IV contrast (APPLIED)
Comparison: 07/14/2011, 08/11/2010, 08/19/2009, 08/30/2007.

CLINICAL DATA: Generalized abdominal pain, nausea and vomiting, and
diarrhea which began earlier today. Surgical history includes
hysterectomy.

EXAM:
CT ABDOMEN AND PELVIS WITH CONTRAST
TECHNIQUE: Multidetector CT imaging of the abdomen and pelvis was performed
using the standard protocol following bolus administration of
intravenous contrast.
CONTRAST:  100mL OMNIPAQUE IOHEXOL 300 MG/ML IV. Oral contrast was
also administered.

[Series 2: abd/ pelvis 5.0 i30f 1 · axial · 0.97mm/px · z∈[+563,+963]mm · 13 of 90 slices shown, 15 images]
[im 5/90  soft-tissue]
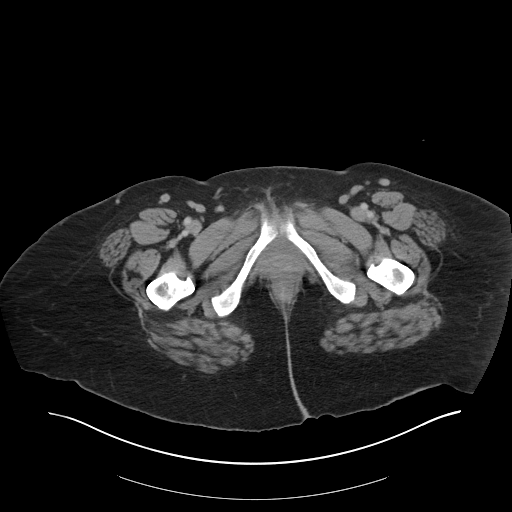
[im 5/90  bone]
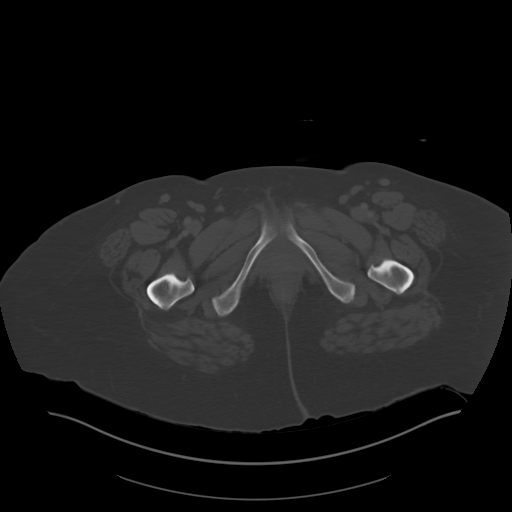
[im 13/90  soft-tissue]
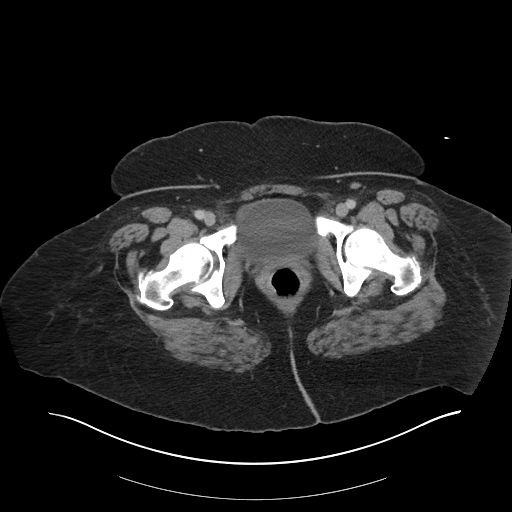
[im 17/90  soft-tissue]
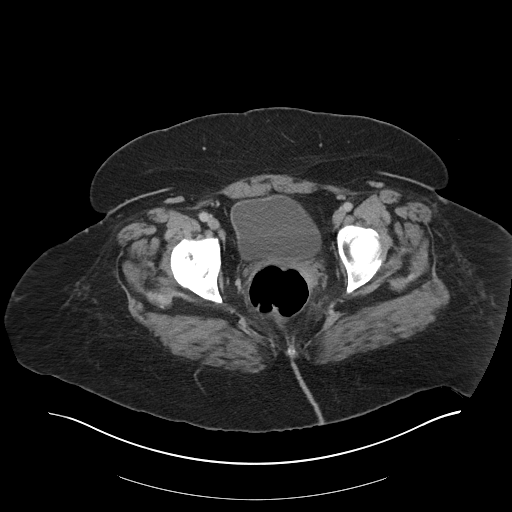
[im 26/90  soft-tissue]
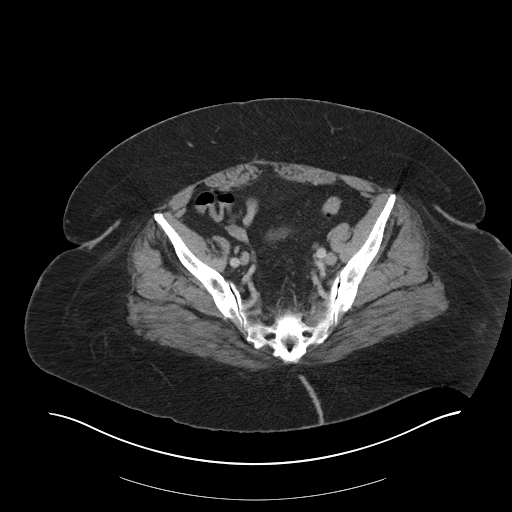
[im 30/90  soft-tissue]
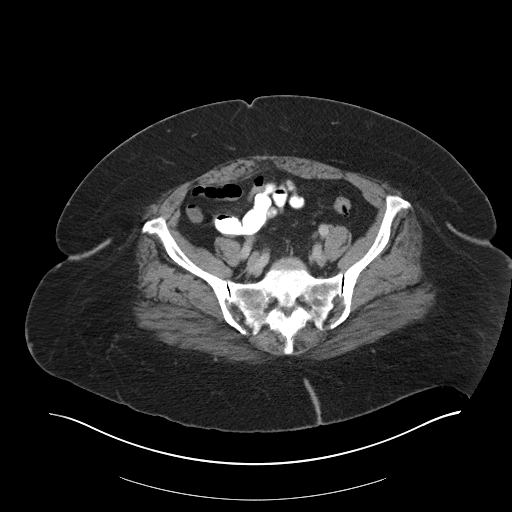
[im 39/90  soft-tissue]
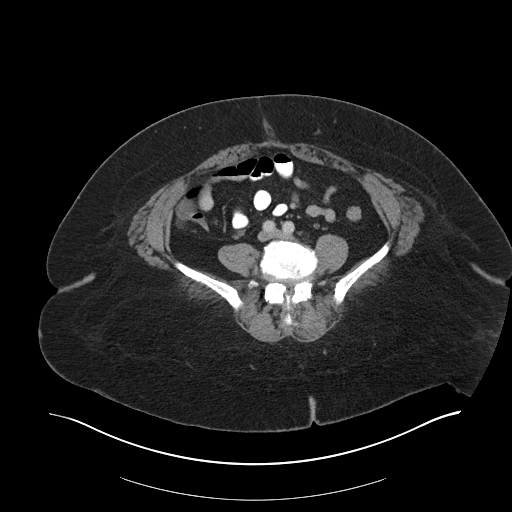
[im 47/90  soft-tissue]
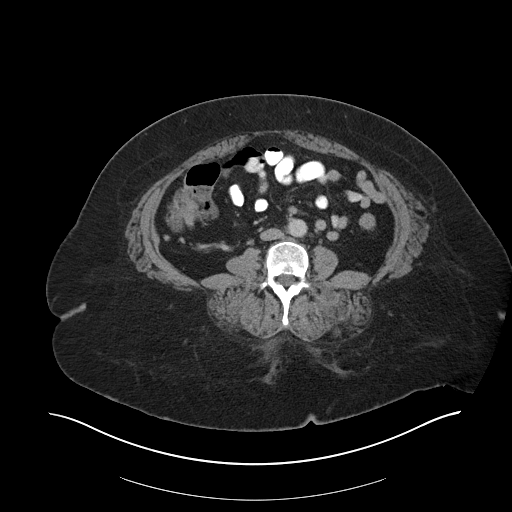
[im 51/90  soft-tissue]
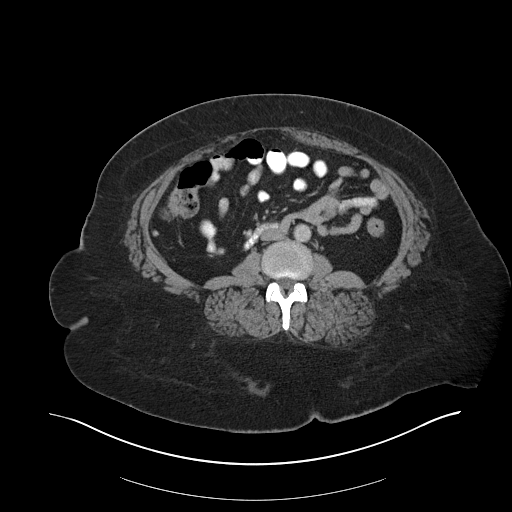
[im 60/90  soft-tissue]
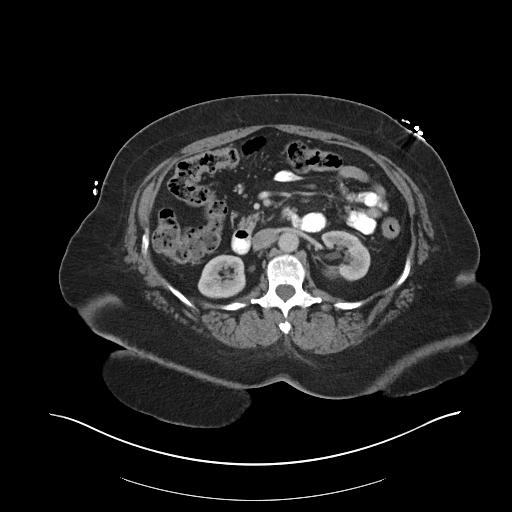
[im 60/90  bone]
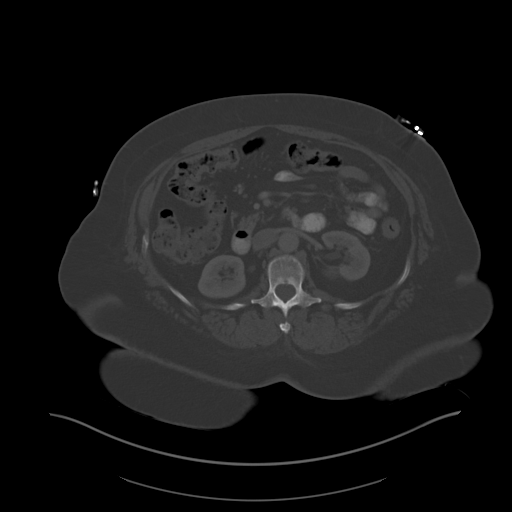
[im 64/90  soft-tissue]
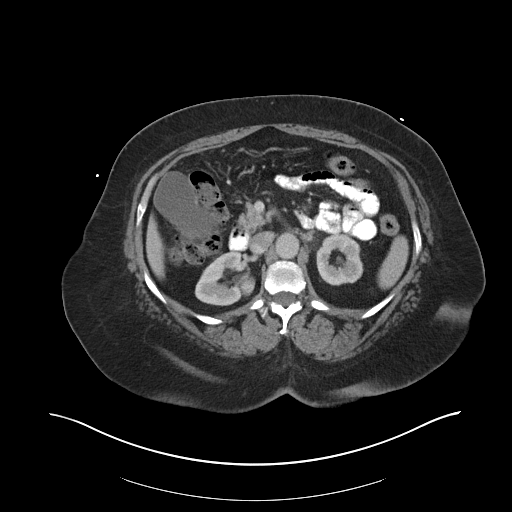
[im 73/90  soft-tissue]
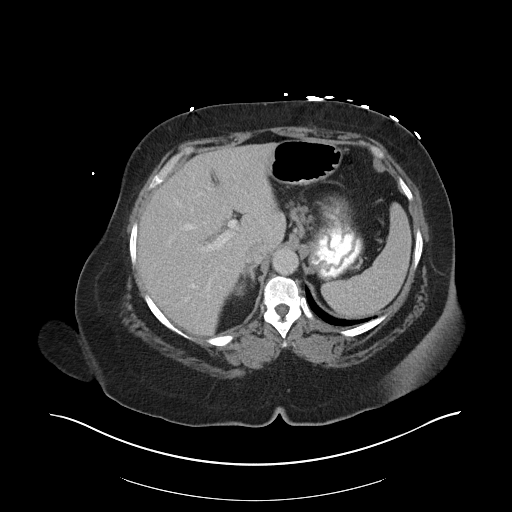
[im 77/90  soft-tissue]
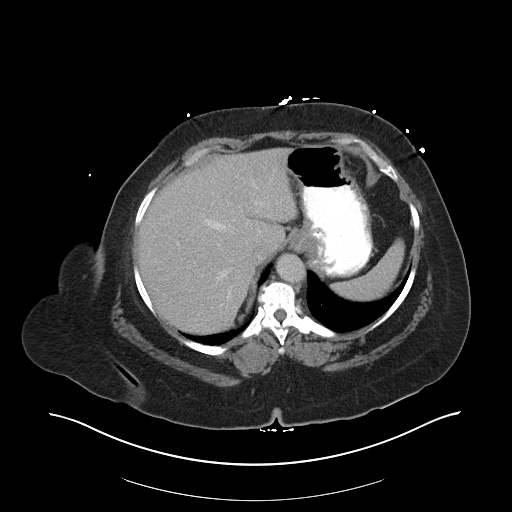
[im 85/90  soft-tissue]
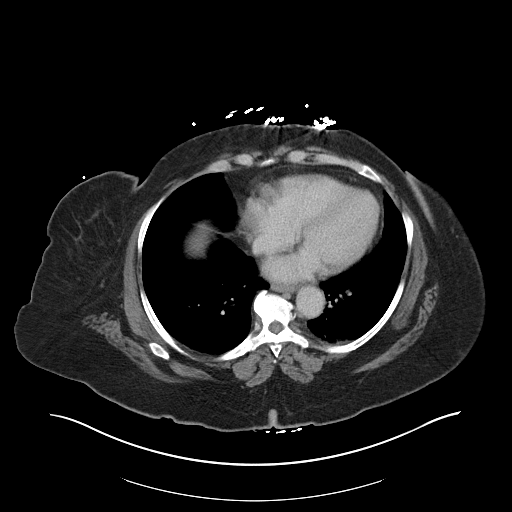

[Series 5: coronal soft tissue · coronal · 0.88mm/px · 3 of 111 slices shown]
[im 37/111  soft-tissue]
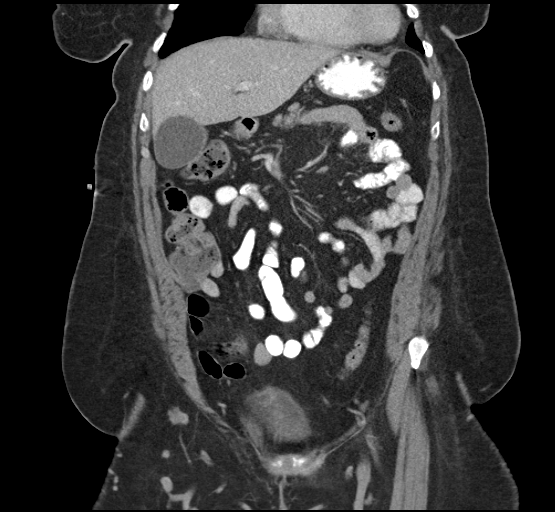
[im 49/111  soft-tissue]
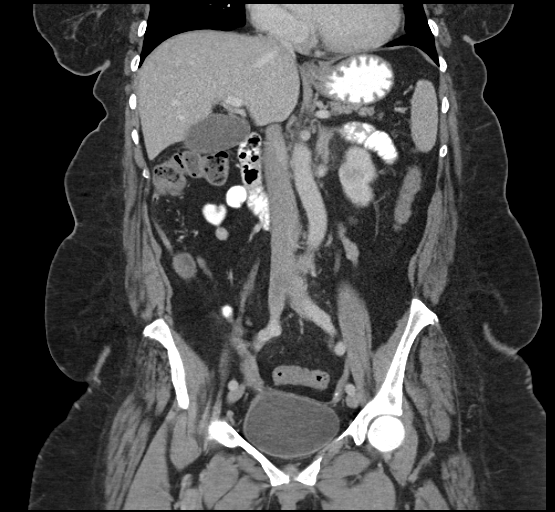
[im 62/111  soft-tissue]
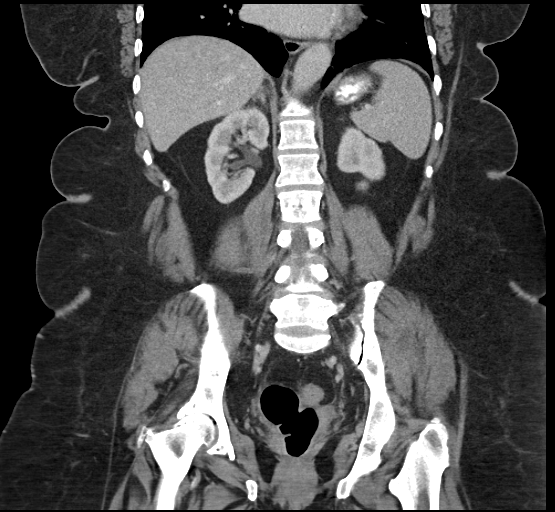

[16 of 46 positions shown; findings below may reference images not displayed]

FINDINGS: Hepatobiliary: Liver normal in size and appearance. Normal-appearing
gallbladder without calcified gallstones. No biliary ductal
dilation.

Spleen:  Normal in size and appearance.

Pancreas:  Normal in appearance.  No pancreatic ductal dilation.

Adrenal glands:  Normal in appearance.

Genitourinary: Normal-appearing right kidney. Scarring involving the
upper pole of the otherwise normal appearing left kidney,
progressive since 3310. Normal-appearing decompressed urinary
bladder.

Uterus surgically absent. Normal-appearing atrophic ovaries. No
adnexal masses.

Gastrointestinal: Stomach normal in appearance for degree of
distention. Normal-appearing small bowel. Scattered diverticula of
arising from the sigmoid colon without evidence of acute
diverticulitis. Entire colon relatively decompressed with expected
stool burden. Cecum positioned in the right mid abdomen, with a
normal-appearing retrocecal appendix in the right upper quadrant.

Ascites:  Absent.

Vascular: Minimal aortoiliac atherosclerosis. Widely patent visceral
arteries.

Lymphatic:  No pathologic lymphadenopathy in the abdomen or pelvis.

Other findings: None.

Musculoskeletal: Degenerative disc disease and spondylosis involving
the lower thoracic spine. Severe degenerative changes involving the
right hip with large geodes in the acetabular roof and the femoral
head. Degenerative changes involving both sacroiliac joints.

Visualized lower thorax: Patchy ground-glass airspace opacity deep
in the left lower lobe. Bronchiectasis involving the medial left
lower lobe. Heart size upper normal to slightly enlarged.
IMPRESSION: 1. No acute abnormalities involving the abdomen or pelvis.
2. Scattered sigmoid colon diverticula without evidence of acute
diverticulitis.
3. Scarring involving the upper pole of the left kidney which is
otherwise normal in appearance.
4. Patchy ground-glass opacity deep in the left lower lobe,
consistent with acute infectious or inflammatory etiology.
Bronchiectasis involving the medial left lower lobe.
5. Severe osteoarthritis involving the right hip.

## 2016-02-22 ENCOUNTER — Telehealth: Payer: Self-pay | Admitting: Physical Therapy

## 2016-02-22 NOTE — Telephone Encounter (Signed)
Called patient regarding missed appt 02/21/16.  She said she overslept and plans to be here for her appt 02/23/16.

## 2016-02-23 ENCOUNTER — Ambulatory Visit: Payer: Medicare Other | Admitting: Physical Therapy

## 2016-02-23 DIAGNOSIS — G8929 Other chronic pain: Secondary | ICD-10-CM

## 2016-02-23 DIAGNOSIS — R2689 Other abnormalities of gait and mobility: Secondary | ICD-10-CM | POA: Diagnosis not present

## 2016-02-23 DIAGNOSIS — M5441 Lumbago with sciatica, right side: Secondary | ICD-10-CM | POA: Diagnosis not present

## 2016-02-23 DIAGNOSIS — M6281 Muscle weakness (generalized): Secondary | ICD-10-CM

## 2016-02-23 NOTE — Therapy (Signed)
[place], [place], [address] Phone: [phone]   Fax:  [phone]  Physical Therapy Treatment  Patient Details  Name: Vanessa Palmer MRN: [id number] Date of Birth: 05/21/1950 Referring Provider: Erskine Emery, PA  Encounter Date: 02/23/2016      PT End of Session - 02/23/16 1100    Visit Number 3   Number of Visits 16   Date for PT Re-Evaluation 03/30/16   PT Start Time 0388   PT Stop Time 1106   PT Time Calculation (min) 52 min   Activity Tolerance Patient tolerated treatment well   Behavior During Therapy [facility] for tasks assessed/performed      Past Medical History:  Diagnosis Date  . ALCOHOL ABUSE 12/13/2005   Annotation: Sober since 11/06 Qualifier: Diagnosis of  By: Pearline Cables MD, Belenda Cruise    . Anemia   . CAD (coronary artery disease)    s/p non-Q wave MI in 06/2001 and 2004, 2005  . CHF (congestive heart failure) ([place])   . Chronic kidney disease (CKD), stage III (moderate) 09/19/2010  . Depression   . DJD (degenerative joint disease)   . DVT of lower extremity, bilateral ([place]) 04/04/2012   On Xarelto    . GERD (gastroesophageal reflux disease)   . Gout   . Headache    migraines  . Hyperlipidemia   . Hypertension   . INTRINSIC ASTHMA, WITH EXACERBATION 09/27/2009   Qualifier: Diagnosis of  By: Stanford Scotland MD, Jiles Garter    . Obesity   . OSA (obstructive sleep apnea)    previously used CPAP, has lost  . Seizures ([place])    As a teenager     Past Surgical History:  Procedure Laterality Date  . CARDIAC CATHETERIZATION     06/2001, 02/2002, 10/2004 (10-15% proximal stenosis of circumflex, diffuse disease of  OM1 and RCA)  . COLONOSCOPY    . KNEE ARTHROSCOPY Left   . PARTIAL HYSTERECTOMY     due to endometriosis  . TOTAL HIP ARTHROPLASTY Right 11/23/2014   Procedure: RIGHT TOTAL HIP ARTHROPLASTY ANTERIOR APPROACH;  Surgeon: Mcarthur Rossetti, MD;  Location: [place];  Service: Orthopedics;   Laterality: Right;  . TUBAL LIGATION      There were no vitals filed for this visit.      Subjective Assessment - 02/23/16 1018    Subjective No new complaints, back is about  5/10 premedicated.  Pt will be getting new PWC tomorow!   Currently in Pain? Yes   Pain Score 5    Pain Location Back   Pain Orientation Lower;Right   Pain Descriptors / Indicators Aching   Pain Type Chronic pain   Pain Radiating Towards Rt. prox hip    Pain Onset More than a month ago   Pain Frequency Constant   Aggravating Factors  standing, bending   Pain Relieving Factors pain meds, heat                          [facility] Adult PT Treatment/Exercise - 02/23/16 1025      Self-Care   Other Self-Care Comments  supine to sit to supine to minimize back pain , HEP      Lumbar Exercises: Stretches   Active Hamstring Stretch 2 reps;30 seconds   Active Hamstring Stretch Limitations seated    Single Knee to Chest Stretch 2 reps;30 seconds   Lower Trunk Rotation 10 seconds   Lower Trunk Rotation Limitations x 10  Pelvic Tilt 5 reps   Pelvic Tilt Limitations x 10 max cues      Lumbar Exercises: Aerobic   Stationary Bike NuStep for 5 min, L4      Lumbar Exercises: Seated   Sit to Stand --   Other Seated Lumbar Exercises --     Lumbar Exercises: Supine   Ab Set 10 reps   AB Set Limitations added ball squeeze    Clam 10 reps   Heel Slides --   Bent Knee Raise 10 reps   Bent Knee Raise Limitations modified to do in sitting due to back pain in supine    Straight Leg Raise 10 reps   Large Ball Abdominal Isometric 10 reps   Large Ball Abdominal Isometric Limitations small ball      Moist Heat Therapy   Number Minutes Moist Heat 10 Minutes  seated    Moist Heat Location Lumbar Spine                  PT Short Term Goals - 02/23/16 1032      PT SHORT TERM GOAL #1   Title Pt will be I with initial HEP and baasic concepts of body mechanics, transfers.    Status On-going      PT SHORT TERM GOAL #2   Title Pt will tolerate supine with min increase in pain with mat Ex.    Baseline pain increases    Status On-going     PT SHORT TERM GOAL #3   Title Pt will report less pain in Rt. LE (25% less) with ADLs.    Status On-going     PT SHORT TERM GOAL #4   Title Balance screen, goal to be set    Status Achieved           PT Long Term Goals - 02/23/16 1035      PT LONG TERM GOAL #1   Title Pt will be I with HEP and posture, body mechanics.    Status On-going     PT LONG TERM GOAL #2   Title Pt will be able to do light housework, vacuum with min increase in back pain.    Status On-going     PT LONG TERM GOAL #3   Title Balance goal 32/56 to demo improvement in balance performance.    Time 8   Period Weeks   Status New     PT LONG TERM GOAL #4   Title Further goals to be set as pt progresses.    Status Unable to assess               Plan - 02/23/16 1054    Clinical Impression Statement Patient cont to need cues for HEP.  Supine increases her back pain.  No goals met thus far. She was in good spirits as she will be able to use her PWC to get to PT, MD appts and be more independent.     PT Next Visit Plan NuStep standing and seated ther ex   PT Home Exercise Plan LTR, S KTC and seated HS   Consulted and Agree with Plan of Care Patient      Patient will benefit from skilled therapeutic intervention in order to improve the following deficits and impairments:  Decreased range of motion, Difficulty walking, Obesity, Decreased endurance, Decreased activity tolerance, Pain, Improper body mechanics, Impaired flexibility, Decreased balance, Decreased mobility, Decreased strength, Increased edema, Postural dysfunction, Impaired sensation  Visit Diagnosis: Other abnormalities  of gait and mobility  Chronic bilateral low back pain with right-sided sciatica  Muscle weakness (generalized)     Problem List Patient Active Problem List   Diagnosis  Date Noted  . Fatigue 11/23/2015  . Nausea and vomiting 11/23/2015  . Anemia 08/18/2015  . Angioedema 12/30/2014  . Status post total replacement of right hip 11/23/2014  . Osteoarthritis of right hip 10/13/2014  . DVT of lower extremity, bilateral ([place]) 04/04/2012  . Routine health maintenance 01/14/2012  . GERD (gastroesophageal reflux disease) 01/09/2012  . Urinary incontinence 12/31/2011  . Chronic kidney disease (CKD), stage III (moderate) 09/19/2010  . Pes planus, congenital 08/08/2010  . Chronic pelvic pain in female 02/03/2010  . Asthma, chronic 09/27/2009  . Elevated alkaline phosphatase level 08/01/2009  . Pain in joint, multiple sites 12/21/2008  . MAJOR DPRSV DISORDER RECURRENT EPISODE MODERATE 09/08/2008  . Gout 07/05/2008  . Hyperlipidemia 05/07/2006  . Coronary atherosclerosis 02/05/2006  . Morbid obesity ([place]) 12/13/2005  . History of alcohol abuse 12/13/2005  . SLEEP APNEA, OBSTRUCTIVE 12/13/2005  . Essential hypertension 12/13/2005    Viktoria Gruetzmacher 02/23/2016, 11:00 AM  [facility] [address] [place], [place], [address] Phone: [phone]   Fax:  [phone]  Name: Vanessa Palmer MRN: [id number] Date of Birth: [birth year]   Raeford Razor, PT 02/23/16 11:00 AM Phone: [phone] Fax: [phone]

## 2016-02-24 DIAGNOSIS — J45901 Unspecified asthma with (acute) exacerbation: Secondary | ICD-10-CM | POA: Diagnosis not present

## 2016-02-24 DIAGNOSIS — R609 Edema, unspecified: Secondary | ICD-10-CM | POA: Diagnosis not present

## 2016-02-24 DIAGNOSIS — G4733 Obstructive sleep apnea (adult) (pediatric): Secondary | ICD-10-CM | POA: Diagnosis not present

## 2016-02-24 DIAGNOSIS — M1990 Unspecified osteoarthritis, unspecified site: Secondary | ICD-10-CM | POA: Diagnosis not present

## 2016-02-24 LAB — TOXASSURE SELECT,+ANTIDEPR,UR

## 2016-02-28 ENCOUNTER — Ambulatory Visit: Payer: Medicare Other | Attending: Physician Assistant | Admitting: Physical Therapy

## 2016-02-28 DIAGNOSIS — G8929 Other chronic pain: Secondary | ICD-10-CM

## 2016-02-28 DIAGNOSIS — R2689 Other abnormalities of gait and mobility: Secondary | ICD-10-CM | POA: Diagnosis not present

## 2016-02-28 DIAGNOSIS — M5441 Lumbago with sciatica, right side: Secondary | ICD-10-CM | POA: Insufficient documentation

## 2016-02-28 DIAGNOSIS — M6281 Muscle weakness (generalized): Secondary | ICD-10-CM | POA: Insufficient documentation

## 2016-02-28 NOTE — Patient Instructions (Signed)
From ex drawer, pelvic tilt, 3 stages of dead bug issued from ex drawer.  10 X 1-2 x a day

## 2016-02-28 NOTE — Therapy (Signed)
[facility] [address] [place], [place], [address] Phone: [phone]   Fax:  [phone]  Physical Therapy Treatment  Patient Details  Name: Vanessa Palmer D Lipinski MRN: [id number] Date of Birth: 1950-10-15 Referring Provider: Richardean CanalGilbert Clark, PA  Encounter Date: 02/28/2016      PT End of Session - 02/28/16 1258    Visit Number 4   Number of Visits 16   PT Start Time 1017   PT Stop Time 1100   PT Time Calculation (min) 43 min   Activity Tolerance Patient tolerated treatment well   Behavior During Therapy [facility] for tasks assessed/performed      Past Medical History:  Diagnosis Date  . ALCOHOL ABUSE 12/13/2005   Annotation: Sober since 11/06 Qualifier: Diagnosis of  By: Wallace CullensGray MD, Natalia LeatherwoodKatherine    . Anemia   . CAD (coronary artery disease)    s/p non-Q wave MI in 06/2001 and 2004, 2005  . CHF (congestive heart failure) ([facility])   . Chronic kidney disease (CKD), stage III (moderate) 09/19/2010  . Depression   . DJD (degenerative joint disease)   . DVT of lower extremity, bilateral ([facility]) 04/04/2012   On Xarelto    . GERD (gastroesophageal reflux disease)   . Gout   . Headache    migraines  . Hyperlipidemia   . Hypertension   . INTRINSIC ASTHMA, WITH EXACERBATION 09/27/2009   Qualifier: Diagnosis of  By: Denton MeekKarimova MD, Tillie RungNodira    . Obesity   . OSA (obstructive sleep apnea)    previously used CPAP, has lost  . Seizures ([facility])    As a teenager     Past Surgical History:  Procedure Laterality Date  . CARDIAC CATHETERIZATION     06/2001, 02/2002, 10/2004 (10-15% proximal stenosis of circumflex, diffuse disease of  OM1 and RCA)  . COLONOSCOPY    . KNEE ARTHROSCOPY Left   . PARTIAL HYSTERECTOMY     due to endometriosis  . TOTAL HIP ARTHROPLASTY Right 11/23/2014   Procedure: RIGHT TOTAL HIP ARTHROPLASTY ANTERIOR APPROACH;  Surgeon: Kathryne Hitchhristopher Y Blackman, MD;  [facility];  Service: Orthopedics;  Laterality: Right;  . TUBAL LIGATION       There were no vitals filed for this visit.      Subjective Assessment - 02/28/16 1030    Subjective No change noted.    Currently in Pain? Yes   Pain Score 6    Pain Location Back   Pain Descriptors / Indicators Aching;Cramping   Pain Type Chronic pain   Pain Radiating Towards calf and into legs   Pain Frequency Constant   Aggravating Factors  standing, walking, nighttime   Pain Relieving Factors moving, heat , medication   Effect of Pain on Daily Activities difficult ADL, Walking                         [facility] Adult PT Treatment/Exercise - 02/28/16 0001      Self-Care   Other Self-Care Comments  Quadratus lumborum, scoot to stand, stretch, how to retrograde soft tissue.     Lumbar Exercises: Stretches   Piriformis Stretch --  1 x 5 seconds painful     Lumbar Exercises: Seated   Sit to Stand Limitations instructed to scoot hips to the side for sit to stand     Lumbar Exercises: Supine   Ab Set 10 reps   AB Set Limitations arm reach, smaller motions right,    Also Dead BUG  10 x alternating ,  HEP  Also adductor squeeze with ball 10 x    Clam 10 reps   Clam Limitations with ball squeeze, cues for breathing   Bent Knee Raise 10 reps     Moist Heat Therapy   Number Minutes Moist Heat 20 Minutes   Moist Heat Location Lumbar Spine  while exercising.     Manual Therapy   Manual therapy comments soft tissue retrigrads thigh, gluteal.  able to decrease tightness.                 PT Education - 02/28/16 1116    Education provided Yes   Education Details HEP, QL info stretches, retrograde soft tissue how to   [employer]) Educated Patient   Methods Explanation;Demonstration;Verbal cues   Comprehension Verbalized understanding;Returned demonstration          PT Short Term Goals - 02/28/16 1303      PT SHORT TERM GOAL #1   Title Pt will be I with initial HEP and baasic concepts of body mechanics, transfers.    Baseline Workred on sit to  stand with hip scoot to side to avoid flare of QL   Time 4   Period Weeks   Status On-going     PT SHORT TERM GOAL #2   Title Pt will tolerate supine with min increase in pain with mat Ex.    Baseline heat helped tolerate mat exercises   Time 4   Period Weeks   Status On-going     PT SHORT TERM GOAL #3   Title Pt will report less pain in Rt. LE (25% less) with ADLs.    Baseline unchanged   Time 4   Period Weeks   Status On-going     PT SHORT TERM GOAL #4   Title Balance screen, goal to be set    Status Achieved           PT Long Term Goals - 02/23/16 1035      PT LONG TERM GOAL #1   Title Pt will be I with HEP and posture, body mechanics.    Status On-going     PT LONG TERM GOAL #2   Title Pt will be able to do light housework, vacuum with min increase in back pain.    Status On-going     PT LONG TERM GOAL #3   Title Balance goal 32/56 to demo improvement in balance performance.    Time 8   Period Weeks   Status New     PT LONG TERM GOAL #4   Title Further goals to be set as pt progresses.    Status Unable to assess               Plan - 02/28/16 1258    Clinical Impression Statement Progress toward her HEP goals.  She tolerated supine better with the moist heat.  Right thigh, gluteal were congester and hypersensitive to pressure which improved with manual.  She continues to not tolerate hip stretching,  QL on right hypersensitive, better with manual and stretches.  No pain at end of session sittinhg on mat.   PT Next Visit Plan NuStep standing and seated ther ex,  review exercises,  manual as needeed   PT Home Exercise Plan LTR, S KTC and seated HS.  1/2 pelvic tilt, 3 stage dead bug.   Consulted and Agree with Plan of Care Patient      Patient will benefit from skilled  therapeutic intervention in order to improve the following deficits and impairments:  Decreased range of motion, Difficulty walking, Obesity, Decreased endurance, Decreased activity  tolerance, Pain, Improper body mechanics, Impaired flexibility, Decreased balance, Decreased mobility, Decreased strength, Increased edema, Postural dysfunction, Impaired sensation  Visit Diagnosis: Other abnormalities of gait and mobility  Chronic bilateral low back pain with right-sided sciatica  Muscle weakness (generalized)     Problem List Patient Active Problem List   Diagnosis Date Noted  . Fatigue 11/23/2015  . Nausea and vomiting 11/23/2015  . Anemia 08/18/2015  . Angioedema 12/30/2014  . Status post total replacement of right hip 11/23/2014  . Osteoarthritis of right hip 10/13/2014  . DVT of lower extremity, bilateral ([facility]) 04/04/2012  . Routine health maintenance 01/14/2012  . GERD (gastroesophageal reflux disease) 01/09/2012  . Urinary incontinence 12/31/2011  . Chronic kidney disease (CKD), stage III (moderate) 09/19/2010  . Pes planus, congenital 08/08/2010  . Chronic pelvic pain in female 02/03/2010  . Asthma, chronic 09/27/2009  . Elevated alkaline phosphatase level 08/01/2009  . Pain in joint, multiple sites 12/21/2008  . MAJOR DPRSV DISORDER RECURRENT EPISODE MODERATE 09/08/2008  . Gout 07/05/2008  . Hyperlipidemia 05/07/2006  . Coronary atherosclerosis 02/05/2006  . Morbid obesity ([facility]) 12/13/2005  . History of alcohol abuse 12/13/2005  . SLEEP APNEA, OBSTRUCTIVE 12/13/2005  . Essential hypertension 12/13/2005    HARRIS,KAREN PTA 02/28/2016, 1:05 PM  [facility] [address] [place], [place], [address] Phone: [phone]   Fax:  [phone]  Name: DAVEENA ELMORE MRN: [id number] Date of Birth: 1950-05-15

## 2016-03-01 ENCOUNTER — Ambulatory Visit: Payer: Medicare Other | Admitting: Physical Therapy

## 2016-03-01 DIAGNOSIS — M6281 Muscle weakness (generalized): Secondary | ICD-10-CM

## 2016-03-01 DIAGNOSIS — M5441 Lumbago with sciatica, right side: Principal | ICD-10-CM

## 2016-03-01 DIAGNOSIS — R2689 Other abnormalities of gait and mobility: Secondary | ICD-10-CM | POA: Diagnosis not present

## 2016-03-01 DIAGNOSIS — G8929 Other chronic pain: Secondary | ICD-10-CM | POA: Diagnosis not present

## 2016-03-01 NOTE — Therapy (Signed)
[facility] [address] [place], [place], [address] Phone: [phone]   Fax:  [phone]  Physical Therapy Treatment  Patient Details  Name: Vanessa Palmer MRN: [id number] Date of Birth: 25-Aug-1950 Referring Provider: Richardean Canal, PA  Encounter Date: 03/01/2016      PT End of Session - 03/01/16 1130    Visit Number 5   Number of Visits 16   Date for PT Re-Evaluation 03/30/16   PT Start Time 1013   PT Stop Time 1115   PT Time Calculation (min) 62 min   Activity Tolerance Patient tolerated treatment well   Behavior During Therapy [facility] for tasks assessed/performed      Past Medical History:  Diagnosis Date  . ALCOHOL ABUSE 12/13/2005   Annotation: Sober since 11/06 Qualifier: Diagnosis of  By: Wallace Cullens MD, Natalia Leatherwood    . Anemia   . CAD (coronary artery disease)    s/p non-Q wave MI in 06/2001 and 2004, 2005  . CHF (congestive heart failure) ([facility])   . Chronic kidney disease (CKD), stage III (moderate) 09/19/2010  . Depression   . DJD (degenerative joint disease)   . DVT of lower extremity, bilateral ([facility]) 04/04/2012   On Xarelto    . GERD (gastroesophageal reflux disease)   . Gout   . Headache    migraines  . Hyperlipidemia   . Hypertension   . INTRINSIC ASTHMA, WITH EXACERBATION 09/27/2009   Qualifier: Diagnosis of  By: Denton Meek MD, Tillie Rung    . Obesity   . OSA (obstructive sleep apnea)    previously used CPAP, has lost  . Seizures ([facility])    As a teenager     Past Surgical History:  Procedure Laterality Date  . CARDIAC CATHETERIZATION     06/2001, 02/2002, 10/2004 (10-15% proximal stenosis of circumflex, diffuse disease of  OM1 and RCA)  . COLONOSCOPY    . KNEE ARTHROSCOPY Left   . PARTIAL HYSTERECTOMY     due to endometriosis  . TOTAL HIP ARTHROPLASTY Right 11/23/2014   Procedure: RIGHT TOTAL HIP ARTHROPLASTY ANTERIOR APPROACH;  Surgeon: Kathryne Hitch, MD;  [facility];  Service: Orthopedics;   Laterality: Right;  . TUBAL LIGATION      There were no vitals filed for this visit.      Subjective Assessment - 03/01/16 1024    Subjective Last session helped her leg.  Soreness improved, I been rubbing it.  Pt drove her chair to PT today.     Currently in Pain? Yes   Pain Score 4    Pain Location Back  and Rt. hip and thigh    Pain Orientation Right   Pain Descriptors / Indicators Aching   Pain Type Chronic pain   Pain Onset More than a month ago   Pain Frequency Constant                         [facility] Adult PT Treatment/Exercise - 03/01/16 1027      Lumbar Exercises: Seated   Long Arc Quad on Chair Strengthening;Both;1 set;20 reps   LAQ on Chair Weights (lbs) each leg    Other Seated Lumbar Exercises on sit and fit disc: A/P tilts, upper trunk rotation, seated clam and horiz abduction green band x 20 , marching      Knee/Hip Exercises: Stretches   Active Hamstring Stretch Right;2 reps   ITB Stretch Right;2 reps;30 seconds   ITB Stretch Limitations 2 ways, sidelying  and with sheet      Moist Heat Therapy   Number Minutes Moist Heat 15 Minutes   Moist Heat Location Lumbar Spine;Hip     Manual Therapy   Manual therapy comments used Tiger roller to mobilize soft tisseue to quads, lateral hip and gluteals. Complression to soft tissue in L paraspinals   in sidelying and with Rt. hip extended off table edge                PT Education - 03/01/16 1105    Education provided Yes   Education Details outer hip stretching    Person(s) Educated Patient   Methods Explanation;Handout   Comprehension Verbalized understanding;Returned demonstration          PT Short Term Goals - 02/28/16 1303      PT SHORT TERM GOAL #1   Title Pt will be I with initial HEP and baasic concepts of body mechanics, transfers.    Baseline Workred on sit to stand with hip scoot to side to avoid flare of QL   Time 4   Period Weeks   Status On-going     PT SHORT TERM  GOAL #2   Title Pt will tolerate supine with min increase in pain with mat Ex.    Baseline heat helped tolerate mat exercises   Time 4   Period Weeks   Status On-going     PT SHORT TERM GOAL #3   Title Pt will report less pain in Rt. LE (25% less) with ADLs.    Baseline unchanged   Time 4   Period Weeks   Status On-going     PT SHORT TERM GOAL #4   Title Balance screen, goal to be set    Status Achieved           PT Long Term Goals - 02/23/16 1035      PT LONG TERM GOAL #1   Title Pt will be I with HEP and posture, body mechanics.    Status On-going     PT LONG TERM GOAL #2   Title Pt will be able to do light housework, vacuum with min increase in back pain.    Status On-going     PT LONG TERM GOAL #3   Title Balance goal 32/56 to demo improvement in balance performance.    Time 8   Period Weeks   Status New     PT LONG TERM GOAL #4   Title Further goals to be set as pt progresses.    Status Unable to assess               Plan - 03/01/16 1131    Clinical Impression Statement Exercised in sitting position today for better comfort.  Cont to address soreness in Rt. LE and hip with manual work.  Issued stretches for home.     PT Next Visit Plan NuStep standing and seated ther ex,  review exercises,  manual as needeed   PT Home Exercise Plan LTR, S KTC and seated HS.  1/2 pelvic tilt, 3 stage dead bug.   Consulted and Agree with Plan of Care Patient      Patient will benefit from skilled therapeutic intervention in order to improve the following deficits and impairments:  Decreased range of motion, Difficulty walking, Obesity, Decreased endurance, Decreased activity tolerance, Pain, Improper body mechanics, Impaired flexibility, Decreased balance, Decreased mobility, Decreased strength, Increased edema, Postural dysfunction, Impaired sensation  Visit Diagnosis: Chronic bilateral low back  pain with right-sided sciatica  Muscle weakness (generalized)  Other  abnormalities of gait and mobility     Problem List Patient Active Problem List   Diagnosis Date Noted  . Fatigue 11/23/2015  . Nausea and vomiting 11/23/2015  . Anemia 08/18/2015  . Angioedema 12/30/2014  . Status post total replacement of right hip 11/23/2014  . Osteoarthritis of right hip 10/13/2014  . DVT of lower extremity, bilateral ([facility]) 04/04/2012  . Routine health maintenance 01/14/2012  . GERD (gastroesophageal reflux disease) 01/09/2012  . Urinary incontinence 12/31/2011  . Chronic kidney disease (CKD), stage III (moderate) 09/19/2010  . Pes planus, congenital 08/08/2010  . Chronic pelvic pain in female 02/03/2010  . Asthma, chronic 09/27/2009  . Elevated alkaline phosphatase level 08/01/2009  . Pain in joint, multiple sites 12/21/2008  . MAJOR DPRSV DISORDER RECURRENT EPISODE MODERATE 09/08/2008  . Gout 07/05/2008  . Hyperlipidemia 05/07/2006  . Coronary atherosclerosis 02/05/2006  . Morbid obesity ([facility]) 12/13/2005  . History of alcohol abuse 12/13/2005  . SLEEP APNEA, OBSTRUCTIVE 12/13/2005  . Essential hypertension 12/13/2005    Aviela Blundell 03/01/2016, 11:47 AM  [facility] [address] [place], [place], [address] Phone: [phone]   Fax:  [phone]  Name: MARKEDA NARVAEZ MRN: [id number] Date of Birth: 1950/08/04   Karie Mainland, PT 03/01/16 11:47 AM Phone: [phone] Fax: [phone]

## 2016-03-01 NOTE — Patient Instructions (Signed)
Hip Flexor Stretch    Lying on back near edge of bed, bend one leg, foot flat. Hang other leg over edge, relaxed, thigh resting entirely on bed for __1-2__ minutes. Repeat __1-2__ times. Do __2__ sessions per day. Advanced Exercise: Bend knee back keeping thigh in contact with bed.  http://gt2.exer.us/347   Copyright  VHI. All rights reserved. Outer Hip Stretch: Reclined IT Band Stretch (Strap)    Strap around opposite foot, pull across only as far as possible with shoulders on mat. Hold for __30 sec __ breaths. Repeat _2-3___ times each leg.  Copyright  VHI. All rights reserved.

## 2016-03-05 ENCOUNTER — Ambulatory Visit (INDEPENDENT_AMBULATORY_CARE_PROVIDER_SITE_OTHER): Payer: Medicare Other | Admitting: Physician Assistant

## 2016-03-06 ENCOUNTER — Ambulatory Visit: Payer: Medicare Other | Admitting: Physical Therapy

## 2016-03-08 ENCOUNTER — Other Ambulatory Visit: Payer: Self-pay | Admitting: Cardiology

## 2016-03-08 ENCOUNTER — Ambulatory Visit: Payer: Medicare Other | Admitting: Physical Therapy

## 2016-03-08 DIAGNOSIS — I119 Hypertensive heart disease without heart failure: Secondary | ICD-10-CM | POA: Diagnosis not present

## 2016-03-08 DIAGNOSIS — I25119 Atherosclerotic heart disease of native coronary artery with unspecified angina pectoris: Secondary | ICD-10-CM | POA: Diagnosis not present

## 2016-03-08 DIAGNOSIS — R079 Chest pain, unspecified: Secondary | ICD-10-CM

## 2016-03-08 DIAGNOSIS — E785 Hyperlipidemia, unspecified: Secondary | ICD-10-CM | POA: Diagnosis not present

## 2016-03-08 DIAGNOSIS — I252 Old myocardial infarction: Secondary | ICD-10-CM | POA: Diagnosis not present

## 2016-03-12 ENCOUNTER — Other Ambulatory Visit: Payer: Self-pay | Admitting: Internal Medicine

## 2016-03-12 ENCOUNTER — Ambulatory Visit: Payer: Medicare Other | Admitting: Physical Therapy

## 2016-03-12 VITALS — BP 158/86

## 2016-03-12 DIAGNOSIS — R2689 Other abnormalities of gait and mobility: Secondary | ICD-10-CM

## 2016-03-12 DIAGNOSIS — G8929 Other chronic pain: Secondary | ICD-10-CM | POA: Diagnosis not present

## 2016-03-12 DIAGNOSIS — M5441 Lumbago with sciatica, right side: Principal | ICD-10-CM

## 2016-03-12 DIAGNOSIS — M6281 Muscle weakness (generalized): Secondary | ICD-10-CM

## 2016-03-12 NOTE — Therapy (Signed)
Vanessa Palmer, [place], [address] Phone: [phone]   Fax:  [phone]  Physical Therapy Treatment  Patient Details  Name: Vanessa Palmer MRN: [id number] Date of Birth: 1950/05/25 Referring Provider: Erskine Emery, PA  Encounter Date: 03/12/2016      PT End of Session - 03/12/16 0810    Visit Number 6   Number of Visits 16   Date for PT Re-Evaluation 03/30/16   PT Start Time 0815   PT Stop Time 0906   PT Time Calculation (min) 51 min   Activity Tolerance Patient tolerated treatment well   Behavior During Therapy [facility] for tasks assessed/performed      Past Medical History:  Diagnosis Date  . ALCOHOL ABUSE 12/13/2005   Annotation: Sober since 11/06 Qualifier: Diagnosis of  By: Pearline Cables MD, Belenda Cruise    . Anemia   . CAD (coronary artery disease)    s/p non-Q wave MI in 06/2001 and 2004, 2005  . CHF (congestive heart failure) ([place])   . Chronic kidney disease (CKD), stage III (moderate) 09/19/2010  . Depression   . DJD (degenerative joint disease)   . DVT of lower extremity, bilateral ([place]) 04/04/2012   On Xarelto    . GERD (gastroesophageal reflux disease)   . Gout   . Headache    migraines  . Hyperlipidemia   . Hypertension   . INTRINSIC ASTHMA, WITH EXACERBATION 09/27/2009   Qualifier: Diagnosis of  By: Stanford Scotland MD, Jiles Garter    . Obesity   . OSA (obstructive sleep apnea)    previously used CPAP, has lost  . Seizures ([place])    As a teenager     Past Surgical History:  Procedure Laterality Date  . CARDIAC CATHETERIZATION     06/2001, 02/2002, 10/2004 (10-15% proximal stenosis of circumflex, diffuse disease of  OM1 and RCA)  . COLONOSCOPY    . KNEE ARTHROSCOPY Left   . PARTIAL HYSTERECTOMY     due to endometriosis  . TOTAL HIP ARTHROPLASTY Right 11/23/2014   Procedure: RIGHT TOTAL HIP ARTHROPLASTY ANTERIOR APPROACH;  Surgeon: Mcarthur Rossetti, MD;  Location: [place];  Service: Orthopedics;   Laterality: Right;  . TUBAL LIGATION      Vitals:   03/12/16 0822 03/12/16 0827  BP: (!) 169/104 (!) 158/86        Subjective Assessment - 03/12/16 0840    Subjective Had an anxiety attack last night.  Had some chest pain, called EMS.  Anxiety caused by worrying about BP.  Having a stress test Wed (cardiolyte?)    Pain Onset More than a month ago           [facility] Adult PT Treatment/Exercise - 03/12/16 0826      Lumbar Exercises: Stretches   Active Hamstring Stretch 2 reps;30 seconds   Single Knee to Chest Stretch 2 reps;30 seconds   Lower Trunk Rotation 10 seconds   Lower Trunk Rotation Limitations x 10      Lumbar Exercises: Supine   Ab Set 10 reps   Clam 10 reps   Bent Knee Raise 10 reps   Other Supine Lumbar Exercises Post pelvic tilt x 10 into MHP      Knee/Hip Exercises: Sidelying   Hip ABduction Strengthening;Both;1 set   Clams x 20 each , incr effort for Rt. hip      Moist Heat Therapy   Number Minutes Moist Heat 15 Minutes   Moist Heat Location Lumbar Spine;Hip  PT Education - 03/12/16 0841    Education provided Yes   Education Details BP recommendations, avoid Cardio (NuStep)   Person(s) Educated Patient   Methods Explanation   Comprehension Verbalized understanding          PT Short Term Goals - 03/12/16 0844      PT SHORT TERM GOAL #1   Title Pt will be I with initial HEP and basic concepts of body mechanics, transfers.    Status Partially Met     PT SHORT TERM GOAL #2   Title Pt will tolerate supine with min increase in pain with mat Ex.    Status Achieved     PT SHORT TERM GOAL #3   Title Pt will report less pain in Rt. LE (25% less) with ADLs.    Status On-going     PT SHORT TERM GOAL #4   Title Balance screen, goal to be set    Status Achieved           PT Long Term Goals - 03/12/16 0844      PT LONG TERM GOAL #1   Title Pt will be I with HEP and posture, body mechanics.    Status On-going     PT  LONG TERM GOAL #2   Title Pt will be able to do light housework, vacuum with min increase in back pain.    Status On-going     PT LONG TERM GOAL #3   Title Balance goal 32/56 to demo improvement in balance performance.    Status Unable to assess               Plan - 03/12/16 5462    Clinical Impression Statement Patient with elevated BP today, modified session to stay on therapy mat.  She tolerates supine much better than initially. Following up with Cardiologist Wed.    PT Next Visit Plan BP? progress strength and functional strengthening   PT Home Exercise Plan LTR, S KTC and seated HS.  1/2 pelvic tilt, 3 stage dead bug.   Consulted and Agree with Plan of Care Patient      Patient will benefit from skilled therapeutic intervention in order to improve the following deficits and impairments:  Decreased range of motion, Difficulty walking, Obesity, Decreased endurance, Decreased activity tolerance, Pain, Improper body mechanics, Impaired flexibility, Decreased balance, Decreased mobility, Decreased strength, Increased edema, Postural dysfunction, Impaired sensation  Visit Diagnosis: Chronic bilateral low back pain with right-sided sciatica  Muscle weakness (generalized)  Other abnormalities of gait and mobility     Problem List Patient Active Problem List   Diagnosis Date Noted  . Fatigue 11/23/2015  . Nausea and vomiting 11/23/2015  . Anemia 08/18/2015  . Angioedema 12/30/2014  . Status post total replacement of right hip 11/23/2014  . Osteoarthritis of right hip 10/13/2014  . DVT of lower extremity, bilateral ([place]) 04/04/2012  . Routine health maintenance 01/14/2012  . GERD (gastroesophageal reflux disease) 01/09/2012  . Urinary incontinence 12/31/2011  . Chronic kidney disease (CKD), stage III (moderate) 09/19/2010  . Pes planus, congenital 08/08/2010  . Chronic pelvic pain in female 02/03/2010  . Asthma, chronic 09/27/2009  . Elevated alkaline phosphatase  level 08/01/2009  . Pain in joint, multiple sites 12/21/2008  . MAJOR DPRSV DISORDER RECURRENT EPISODE MODERATE 09/08/2008  . Gout 07/05/2008  . Hyperlipidemia 05/07/2006  . Coronary atherosclerosis 02/05/2006  . Morbid obesity ([place]) 12/13/2005  . History of alcohol abuse 12/13/2005  . SLEEP APNEA, OBSTRUCTIVE 12/13/2005  .  Essential hypertension 12/13/2005    Jeana Kersting 03/12/2016, 9:04 AM  [facility] [address] [place], [place], [address] Phone: [phone]   Fax:  [phone]  Name: CITLALLI WEIKEL MRN: [id number] Date of Birth: [birth year]   Raeford Razor, PT 03/12/16 9:04 AM Phone: [phone] Fax: [phone]

## 2016-03-13 ENCOUNTER — Ambulatory Visit: Payer: Medicare Other | Admitting: Physical Therapy

## 2016-03-13 VITALS — BP 145/96

## 2016-03-13 DIAGNOSIS — R2689 Other abnormalities of gait and mobility: Secondary | ICD-10-CM

## 2016-03-13 DIAGNOSIS — M5441 Lumbago with sciatica, right side: Principal | ICD-10-CM

## 2016-03-13 DIAGNOSIS — G8929 Other chronic pain: Secondary | ICD-10-CM | POA: Diagnosis not present

## 2016-03-13 DIAGNOSIS — M6281 Muscle weakness (generalized): Secondary | ICD-10-CM

## 2016-03-13 NOTE — Therapy (Signed)
[place], [place], [address] Phone: [phone]   Fax:  [phone]  Physical Therapy Treatment  Patient Details  Name: Vanessa Palmer MRN: [id number] Date of Birth: 04/10/1950 Referring Provider: Erskine Emery, PA  Encounter Date: 03/13/2016      PT End of Session - 03/13/16 1027    Visit Number 7   Number of Visits 16   Date for PT Re-Evaluation 03/30/16   PT Start Time 1016   PT Stop Time 1112   PT Time Calculation (min) 56 min   Activity Tolerance Patient tolerated treatment well   Behavior During Therapy [facility] for tasks assessed/performed      Past Medical History:  Diagnosis Date  . ALCOHOL ABUSE 12/13/2005   Annotation: Sober since 11/06 Qualifier: Diagnosis of  By: Pearline Cables MD, Belenda Cruise    . Anemia   . CAD (coronary artery disease)    s/p non-Q wave MI in 06/2001 and 2004, 2005  . CHF (congestive heart failure) ([place])   . Chronic kidney disease (CKD), stage III (moderate) 09/19/2010  . Depression   . DJD (degenerative joint disease)   . DVT of lower extremity, bilateral ([place]) 04/04/2012   On Xarelto    . GERD (gastroesophageal reflux disease)   . Gout   . Headache    migraines  . Hyperlipidemia   . Hypertension   . INTRINSIC ASTHMA, WITH EXACERBATION 09/27/2009   Qualifier: Diagnosis of  By: Stanford Scotland MD, Jiles Garter    . Obesity   . OSA (obstructive sleep apnea)    previously used CPAP, has lost  . Seizures ([place])    As a teenager     Past Surgical History:  Procedure Laterality Date  . CARDIAC CATHETERIZATION     06/2001, 02/2002, 10/2004 (10-15% proximal stenosis of circumflex, diffuse disease of  OM1 and RCA)  . COLONOSCOPY    . KNEE ARTHROSCOPY Left   . PARTIAL HYSTERECTOMY     due to endometriosis  . TOTAL HIP ARTHROPLASTY Right 11/23/2014   Procedure: RIGHT TOTAL HIP ARTHROPLASTY ANTERIOR APPROACH;  Surgeon: Mcarthur Rossetti, MD;  Location: [place];  Service: Orthopedics;   Laterality: Right;  . TUBAL LIGATION      Vitals:   03/13/16 1023  BP: (!) 145/96        Subjective Assessment - 03/13/16 1023    Subjective I dont really have back pain.  My leg hurts.    Currently in Pain? Yes   Pain Score 6    Pain Location Back   Pain Orientation Right   Pain Descriptors / Indicators Aching;Sore   Pain Type Chronic pain   Pain Onset More than a month ago   Pain Frequency Intermittent   Aggravating Factors  activity   Pain Relieving Factors heat, rest, supine and sitting positions    Effect of Pain on Daily Activities challenges mobilty              [facility] Adult PT Treatment/Exercise - 03/13/16 1053      High Level Balance   High Level Balance Comments standing on foam no UE support: head turns, nods, UE flexion , abduction, close supervision for safety, min incr in back pain with this      Lumbar Exercises: Stretches   Piriformis Stretch 5 reps;10 seconds     Lumbar Exercises: Aerobic   Stationary Bike Nu step L5 UE and LE for 7 min      Lumbar Exercises: Seated  Other Seated Lumbar Exercises blue band: hip flexion x10, abd/ER x 20      Lumbar Exercises: Supine   Clam 20 reps   Clam Limitations blue band    Bridge 20 reps   Bridge Limitations with blue band      Knee/Hip Exercises: Stretches   Hip Flexor Stretch Right;1 rep   Hip Flexor Stretch Limitations approx. 3 min      Moist Heat Therapy   Number Minutes Moist Heat 15 Minutes   Moist Heat Location Lumbar Spine     Manual Therapy   Manual therapy comments Tiger Roller to ant lat Rt. hip, < 5 min due to discomfort mod pressure                 PT Education - 03/13/16 1214    Education provided Yes   Education Details hip stretching    Person(s) Educated Patient   Methods Explanation   Comprehension Verbalized understanding          PT Short Term Goals - 03/12/16 0844      PT SHORT TERM GOAL #1   Title Pt will be I with initial HEP and basic concepts of body  mechanics, transfers.    Status Partially Met     PT SHORT TERM GOAL #2   Title Pt will tolerate supine with min increase in pain with mat Ex.    Status Achieved     PT SHORT TERM GOAL #3   Title Pt will report less pain in Rt. LE (25% less) with ADLs.    Status On-going     PT SHORT TERM GOAL #4   Title Balance screen, goal to be set    Status Achieved           PT Long Term Goals - 03/13/16 1031      PT LONG TERM GOAL #1   Title Pt will be I with HEP and posture, body mechanics.    Status On-going     PT LONG TERM GOAL #2   Title Pt will be able to do light housework, vacuum with min increase in back pain.    Status On-going     PT LONG TERM GOAL #3   Title Balance goal 32/56 to demo improvement in balance performance.    Status On-going               Plan - 03/13/16 1215    Clinical Impression Statement Patient is only limited by Rt. sided hip pain (history of THR).  Stretching off edge of mat was tolerated for 2-3 minutes, relieved groin pain.  Suspect tight iliopsoas causing back pain.  Standing balance ex does increase low back pain minimally.    PT Next Visit Plan what did cardiologist say, progress strength and functional strengthening   PT Home Exercise Plan LTR, S KTC and seated HS.  1/2 pelvic tilt, 3 stage dead bug.   Consulted and Agree with Plan of Care Patient      Patient will benefit from skilled therapeutic intervention in order to improve the following deficits and impairments:  Decreased range of motion, Difficulty walking, Obesity, Decreased endurance, Decreased activity tolerance, Pain, Improper body mechanics, Impaired flexibility, Decreased balance, Decreased mobility, Decreased strength, Increased edema, Postural dysfunction, Impaired sensation  Visit Diagnosis: Chronic bilateral low back pain with right-sided sciatica  Muscle weakness (generalized)  Other abnormalities of gait and mobility     Problem List Patient Active  Problem List   Diagnosis  Date Noted  . Fatigue 11/23/2015  . Nausea and vomiting 11/23/2015  . Anemia 08/18/2015  . Angioedema 12/30/2014  . Status post total replacement of right hip 11/23/2014  . Osteoarthritis of right hip 10/13/2014  . DVT of lower extremity, bilateral ([place]) 04/04/2012  . Routine health maintenance 01/14/2012  . GERD (gastroesophageal reflux disease) 01/09/2012  . Urinary incontinence 12/31/2011  . Chronic kidney disease (CKD), stage III (moderate) 09/19/2010  . Pes planus, congenital 08/08/2010  . Chronic pelvic pain in female 02/03/2010  . Asthma, chronic 09/27/2009  . Elevated alkaline phosphatase level 08/01/2009  . Pain in joint, multiple sites 12/21/2008  . MAJOR DPRSV DISORDER RECURRENT EPISODE MODERATE 09/08/2008  . Gout 07/05/2008  . Hyperlipidemia 05/07/2006  . Coronary atherosclerosis 02/05/2006  . Morbid obesity ([place]) 12/13/2005  . History of alcohol abuse 12/13/2005  . SLEEP APNEA, OBSTRUCTIVE 12/13/2005  . Essential hypertension 12/13/2005    PAA,JENNIFER 03/13/2016, 12:22 PM  [facility] [address] [place], [place], [address] Phone: [phone]   Fax:  [phone]  Name: ROWENE SUTO MRN: [id number] Date of Birth: 11-Jun-1950  Raeford Razor, PT 03/13/16 12:22 PM Phone: [phone] Fax: [phone]

## 2016-03-14 ENCOUNTER — Ambulatory Visit (HOSPITAL_COMMUNITY): Payer: Medicare Other

## 2016-03-14 ENCOUNTER — Encounter (HOSPITAL_COMMUNITY): Payer: Self-pay

## 2016-03-15 ENCOUNTER — Ambulatory Visit: Payer: Medicare Other | Admitting: Physical Therapy

## 2016-03-20 ENCOUNTER — Encounter: Payer: Self-pay | Admitting: Physical Therapy

## 2016-03-20 ENCOUNTER — Ambulatory Visit: Payer: Medicare Other | Admitting: Physical Therapy

## 2016-03-20 DIAGNOSIS — M5441 Lumbago with sciatica, right side: Secondary | ICD-10-CM | POA: Diagnosis not present

## 2016-03-20 DIAGNOSIS — R2689 Other abnormalities of gait and mobility: Secondary | ICD-10-CM | POA: Diagnosis not present

## 2016-03-20 DIAGNOSIS — G8929 Other chronic pain: Secondary | ICD-10-CM

## 2016-03-20 DIAGNOSIS — M6281 Muscle weakness (generalized): Secondary | ICD-10-CM | POA: Diagnosis not present

## 2016-03-20 NOTE — Patient Instructions (Signed)

## 2016-03-20 NOTE — Therapy (Signed)
[place], [place], [address] Phone: [phone]   Fax:  [phone]  Physical Therapy Treatment  Patient Details  Name: Vanessa Palmer MRN: [id number] Date of Birth: Jul 13, 1950 Referring Provider: Erskine Emery, PA  Encounter Date: 03/20/2016      PT End of Session - 03/20/16 1224    Visit Number 8   Number of Visits 16   Date for PT Re-Evaluation 03/30/16   PT Start Time 1017   PT Stop Time 1100   PT Time Calculation (min) 43 min   Activity Tolerance Patient tolerated treatment well   Behavior During Therapy [facility] for tasks assessed/performed      Past Medical History:  Diagnosis Date  . ALCOHOL ABUSE 12/13/2005   Annotation: Sober since 11/06 Qualifier: Diagnosis of  By: Pearline Cables MD, Belenda Cruise    . Anemia   . CAD (coronary artery disease)    s/p non-Q wave MI in 06/2001 and 2004, 2005  . CHF (congestive heart failure) ([place])   . Chronic kidney disease (CKD), stage III (moderate) 09/19/2010  . Depression   . DJD (degenerative joint disease)   . DVT of lower extremity, bilateral ([place]) 04/04/2012   On Xarelto    . GERD (gastroesophageal reflux disease)   . Gout   . Headache    migraines  . Hyperlipidemia   . Hypertension   . INTRINSIC ASTHMA, WITH EXACERBATION 09/27/2009   Qualifier: Diagnosis of  By: Stanford Scotland MD, Jiles Garter    . Obesity   . OSA (obstructive sleep apnea)    previously used CPAP, has lost  . Seizures ([place])    As a teenager     Past Surgical History:  Procedure Laterality Date  . CARDIAC CATHETERIZATION     06/2001, 02/2002, 10/2004 (10-15% proximal stenosis of circumflex, diffuse disease of  OM1 and RCA)  . COLONOSCOPY    . KNEE ARTHROSCOPY Left   . PARTIAL HYSTERECTOMY     due to endometriosis  . TOTAL HIP ARTHROPLASTY Right 11/23/2014   Procedure: RIGHT TOTAL HIP ARTHROPLASTY ANTERIOR APPROACH;  Surgeon: Mcarthur Rossetti, MD;  Location: [place];  Service: Orthopedics;   Laterality: Right;  . TUBAL LIGATION      There were no vitals filed for this visit.      Subjective Assessment - 03/20/16 1020    Subjective Just a little back and leg pain.  Gets a stress test tomorrow.  (Delayed due to snow)  Home exercises are going OK.  I got out of breath with vacume.   Currently in Pain? Yes   Pain Location Back   Pain Orientation Right   Pain Descriptors / Indicators Aching;Sore   Pain Radiating Towards Rtght leg to mid leg    Pain Frequency Intermittent   Aggravating Factors  moving around   Pain Relieving Factors heat, laying down, rest,    Effect of Pain on Daily Activities Limits ADL,  wakes her   Multiple Pain Sites No                         [facility] Adult PT Treatment/Exercise - 03/20/16 0001      Self-Care   Self-Care ADL's   Other Self-Care Comments  ADL ideas discussed, lifting modifications reviewed.      Lumbar Exercises: Aerobic   Stationary Bike Nu step L5 UE and LE for 7.5 min      Lumbar Exercises: Supine   Ab Set 10  reps   Clam 20 reps   Clam Limitations blue band   Bent Knee Raise 10 reps   Bridge 10 reps  2 sets   Bridge Limitations 1 set with blue band     Knee/Hip Exercises: Stretches   Hip Flexor Stretch Right;2 reps   Hip Flexor Stretch Limitations 3 minutes each with moist heat     Knee/Hip Exercises: Sidelying   Clams 5 X 3 sets .  min assist with hhip position.     Moist Heat Therapy   Number Minutes Moist Heat 30 Minutes  during session   Moist Heat Location Lumbar Spine  right groin                PT Education - 03/20/16 1222    Education provided Yes   Education Details Posture and ADL education   Person(s) Educated Patient   Methods Explanation;Demonstration;Verbal cues;Handout   Comprehension Verbalized understanding;Returned demonstration          PT Short Term Goals - 03/20/16 1230      PT SHORT TERM GOAL #1   Title Pt will be I with initial HEP and basic concepts of  body mechanics, transfers.    Baseline able to list things to help with these, modofications.    Time 4   Period Weeks   Status Partially Met     PT SHORT TERM GOAL #2   Title Pt will tolerate supine with min increase in pain with mat Ex.    Baseline heat helped tolerate mat exercises   Time 4   Period Weeks   Status Achieved     PT SHORT TERM GOAL #3   Title Pt will report less pain in Rt. LE (25% less) with ADLs.    Baseline unchanged   Time 4   Period Weeks   Status On-going     PT SHORT TERM GOAL #4   Title Balance screen, goal to be set    Time 4   Period Weeks   Status Achieved           PT Long Term Goals - 03/13/16 1031      PT LONG TERM GOAL #1   Title Pt will be I with HEP and posture, body mechanics.    Status On-going     PT LONG TERM GOAL #2   Title Pt will be able to do light housework, vacuum with min increase in back pain.    Status On-going     PT LONG TERM GOAL #3   Title Balance goal 32/56 to demo improvement in balance performance.    Status On-going               Plan - 03/20/16 1227    Clinical Impression Statement Patient able to list 3 things that will assist her with posture and body mechanics. .  Posture and body mechanics goal partially met.  Strengthening and stretching continued.    PT Next Visit Plan what did cardiologist say, progress strength and functional strengthening,  heat helps with stretching.  FOTO   PT Home Exercise Plan LTR, S KTC and seated HS.  1/2 pelvic tilt, 3 stage dead bug.   Consulted and Agree with Plan of Care Patient      Patient will benefit from skilled therapeutic intervention in order to improve the following deficits and impairments:  Decreased range of motion, Difficulty walking, Obesity, Decreased endurance, Decreased activity tolerance, Pain, Improper body mechanics, Impaired flexibility, Decreased balance,  Decreased mobility, Decreased strength, Increased edema, Postural dysfunction, Impaired  sensation  Visit Diagnosis: Chronic bilateral low back pain with right-sided sciatica  Muscle weakness (generalized)  Other abnormalities of gait and mobility     Problem List Patient Active Problem List   Diagnosis Date Noted  . Fatigue 11/23/2015  . Nausea and vomiting 11/23/2015  . Anemia 08/18/2015  . Angioedema 12/30/2014  . Status post total replacement of right hip 11/23/2014  . Osteoarthritis of right hip 10/13/2014  . DVT of lower extremity, bilateral ([place]) 04/04/2012  . Routine health maintenance 01/14/2012  . GERD (gastroesophageal reflux disease) 01/09/2012  . Urinary incontinence 12/31/2011  . Chronic kidney disease (CKD), stage III (moderate) 09/19/2010  . Pes planus, congenital 08/08/2010  . Chronic pelvic pain in female 02/03/2010  . Asthma, chronic 09/27/2009  . Elevated alkaline phosphatase level 08/01/2009  . Pain in joint, multiple sites 12/21/2008  . MAJOR DPRSV DISORDER RECURRENT EPISODE MODERATE 09/08/2008  . Gout 07/05/2008  . Hyperlipidemia 05/07/2006  . Coronary atherosclerosis 02/05/2006  . Morbid obesity ([place]) 12/13/2005  . History of alcohol abuse 12/13/2005  . SLEEP APNEA, OBSTRUCTIVE 12/13/2005  . Essential hypertension 12/13/2005    Carter Kassel PTA 03/20/2016, 12:34 PM  [facility] [address], [place], [address] Phone: [phone]   Fax:  [phone]  Name: Vanessa Palmer MRN: [id number] Date of Birth: 30-Aug-1950

## 2016-03-21 ENCOUNTER — Ambulatory Visit (HOSPITAL_COMMUNITY)
Admission: RE | Admit: 2016-03-21 | Discharge: 2016-03-21 | Disposition: A | Discharge status: Home / Self Care | Payer: Medicare Other | Source: Ambulatory Visit | Attending: Cardiology | Admitting: Cardiology

## 2016-03-21 DIAGNOSIS — N181 Chronic kidney disease, stage 1: Secondary | ICD-10-CM | POA: Diagnosis not present

## 2016-03-21 DIAGNOSIS — I25119 Atherosclerotic heart disease of native coronary artery with unspecified angina pectoris: Secondary | ICD-10-CM | POA: Diagnosis not present

## 2016-03-21 DIAGNOSIS — I2510 Atherosclerotic heart disease of native coronary artery without angina pectoris: Secondary | ICD-10-CM | POA: Diagnosis not present

## 2016-03-21 DIAGNOSIS — I252 Old myocardial infarction: Secondary | ICD-10-CM | POA: Diagnosis not present

## 2016-03-21 DIAGNOSIS — I129 Hypertensive chronic kidney disease with stage 1 through stage 4 chronic kidney disease, or unspecified chronic kidney disease: Secondary | ICD-10-CM | POA: Diagnosis not present

## 2016-03-21 DIAGNOSIS — R9431 Abnormal electrocardiogram [ECG] [EKG]: Secondary | ICD-10-CM | POA: Diagnosis not present

## 2016-03-21 DIAGNOSIS — R079 Chest pain, unspecified: Secondary | ICD-10-CM | POA: Insufficient documentation

## 2016-03-21 LAB — HEPATIC FUNCTION PANEL
ALBUMIN: 3.5 g/dL (ref 3.5–5.0)
ALK PHOS: 115 U/L (ref 38–126)
ALT: 13 U/L — ABNORMAL LOW (ref 14–54)
AST: 22 U/L (ref 15–41)
BILIRUBIN TOTAL: 1.2 mg/dL (ref 0.3–1.2)
Bilirubin, Direct: 0.1 mg/dL (ref 0.1–0.5)
Indirect Bilirubin: 1.1 mg/dL — ABNORMAL HIGH (ref 0.3–0.9)
Total Protein: 6.7 g/dL (ref 6.5–8.1)

## 2016-03-21 LAB — BASIC METABOLIC PANEL
Anion gap: 7 (ref 5–15)
BUN: 13 mg/dL (ref 6–20)
CHLORIDE: 107 mmol/L (ref 101–111)
CO2: 24 mmol/L (ref 22–32)
CREATININE: 1.19 mg/dL — AB (ref 0.44–1.00)
Calcium: 8.5 mg/dL — ABNORMAL LOW (ref 8.9–10.3)
GFR calc Af Amer: 55 mL/min — ABNORMAL LOW (ref >60)
GFR calc non Af Amer: 48 mL/min — ABNORMAL LOW (ref >60)
Glucose, Bld: 107 mg/dL — ABNORMAL HIGH (ref 65–99)
Potassium: 4.1 mmol/L (ref 3.5–5.1)
Sodium: 138 mmol/L (ref 135–145)

## 2016-03-21 LAB — LIPID PANEL
CHOL/HDL RATIO: 2.7 ratio
CHOLESTEROL: 111 mg/dL (ref 0–200)
HDL: 41 mg/dL (ref >40)
LDL Cholesterol: 57 mg/dL (ref 0–99)
TRIGLYCERIDES: 64 mg/dL (ref <150)
VLDL: 13 mg/dL (ref 0–40)

## 2016-03-21 MED ORDER — TECHNETIUM TC 99M TETROFOSMIN IV KIT
10.0000 | PACK | Freq: Once | INTRAVENOUS | Status: AC | PRN
  Administered 2016-03-21: 10 via INTRAVENOUS

## 2016-03-21 MED ORDER — DIPHENHYDRAMINE HCL 50 MG/ML IJ SOLN
25.0000 mg | Freq: Once | INTRAMUSCULAR | Status: AC
  Administered 2016-03-21: 25 mg via INTRAVENOUS

## 2016-03-21 MED ORDER — DIPHENHYDRAMINE HCL 50 MG/ML IJ SOLN
INTRAMUSCULAR | Status: AC
  Administered 2016-03-21: 25 mg via INTRAVENOUS
  Filled 2016-03-21: qty 1

## 2016-03-21 MED ORDER — TECHNETIUM TC 99M TETROFOSMIN IV KIT
30.0000 | PACK | Freq: Once | INTRAVENOUS | Status: AC | PRN
  Administered 2016-03-21: 30 via INTRAVENOUS

## 2016-03-21 MED ORDER — REGADENOSON 0.4 MG/5ML IV SOLN
INTRAVENOUS | Status: AC
  Administered 2016-03-21: 0.4 mg
  Filled 2016-03-21: qty 5

## 2016-03-21 NOTE — Progress Notes (Signed)
Pt reports having itching and hives following prior lexiscan administration. Eugenie BirksLexiscan is listed as an allergy in chart. MD notified as Eugenie BirksLexiscan has been ordered for today's stress test. Per Dr. Sharyn LullHarwani, will premedicate with 25 mg Benadryl and proceed with test. Order entered and pt informed. Pt agrees to plan

## 2016-03-23 ENCOUNTER — Ambulatory Visit: Payer: Medicare Other | Admitting: Physical Therapy

## 2016-03-23 DIAGNOSIS — G8929 Other chronic pain: Secondary | ICD-10-CM

## 2016-03-23 DIAGNOSIS — M6281 Muscle weakness (generalized): Secondary | ICD-10-CM

## 2016-03-23 DIAGNOSIS — M5441 Lumbago with sciatica, right side: Secondary | ICD-10-CM | POA: Diagnosis not present

## 2016-03-23 DIAGNOSIS — R2689 Other abnormalities of gait and mobility: Secondary | ICD-10-CM

## 2016-03-23 NOTE — Therapy (Signed)
[name], [place], [address] Phone: [phone]   Fax:  [phone]  Physical Therapy Treatment  Patient Details  Name: Vanessa Palmer MRN: [id number] Date of Birth: 05-15-1950 Referring Provider: Erskine Emery, PA  Encounter Date: 03/23/2016      PT End of Session - 03/23/16 0942    Visit Number 9   Number of Visits 16   Date for PT Re-Evaluation 03/30/16   PT Start Time 0933   PT Stop Time 1026   PT Time Calculation (min) 53 min   Activity Tolerance Patient tolerated treatment well   Behavior During Therapy [facility] for tasks assessed/performed      Past Medical History:  Diagnosis Date  . ALCOHOL ABUSE 12/13/2005   Annotation: Sober since 11/06 Qualifier: Diagnosis of  By: Pearline Cables MD, Belenda Cruise    . Anemia   . CAD (coronary artery disease)    s/p non-Q wave MI in 06/2001 and 2004, 2005  . CHF (congestive heart failure) ([place])   . Chronic kidney disease (CKD), stage III (moderate) 09/19/2010  . Depression   . DJD (degenerative joint disease)   . DVT of lower extremity, bilateral ([place]) 04/04/2012   On Xarelto    . GERD (gastroesophageal reflux disease)   . Gout   . Headache    migraines  . Hyperlipidemia   . Hypertension   . INTRINSIC ASTHMA, WITH EXACERBATION 09/27/2009   Qualifier: Diagnosis of  By: Stanford Scotland MD, Jiles Garter    . Obesity   . OSA (obstructive sleep apnea)    previously used CPAP, has lost  . Seizures ([place])    As a teenager     Past Surgical History:  Procedure Laterality Date  . CARDIAC CATHETERIZATION     06/2001, 02/2002, 10/2004 (10-15% proximal stenosis of circumflex, diffuse disease of  OM1 and RCA)  . COLONOSCOPY    . KNEE ARTHROSCOPY Left   . PARTIAL HYSTERECTOMY     due to endometriosis  . TOTAL HIP ARTHROPLASTY Right 11/23/2014   Procedure: RIGHT TOTAL HIP ARTHROPLASTY ANTERIOR APPROACH;  Surgeon: Mcarthur Rossetti, MD;  Location: [place];  Service: Orthopedics;   Laterality: Right;  . TUBAL LIGATION      There were no vitals filed for this visit.      Subjective Assessment - 03/23/16 0936    Subjective Stress test was good, no blockages.  Back is not bothering her this week has been good, hip has been sore and painful. Using cane today as her daughter brought her.  Yesterday was a sad day for her, daughter passed 13 yrs ago.    How long can you stand comfortably? 10 min    How long can you walk comfortably? 10 min , really not sure , doesn't push it as she has a PWC    Currently in Pain? Yes   Pain Score 8    Pain Location Hip   Pain Orientation Right   Pain Descriptors / Indicators Aching;Sore  due to surgery    Pain Type Chronic pain   Pain Radiating Towards none    Pain Onset More than a month ago   Pain Frequency Intermittent   Aggravating Factors  standing, walking , stress    Pain Relieving Factors heat, meds, rest             [facility] PT Assessment - 03/23/16 0940      Berg Balance Test   Sit to Stand Able to stand without  using hands and stabilize independently   Standing Unsupported Able to stand safely 2 minutes  knee and hip pain    Sitting with Back Unsupported but Feet Supported on Floor or Stool Able to sit safely and securely 2 minutes   Stand to Sit Sits safely with minimal use of hands   Transfers Able to transfer safely, minor use of hands   Standing Unsupported with Eyes Closed Able to stand 10 seconds safely   Standing Ubsupported with Feet Together Able to place feet together independently and stand 1 minute safely   From Standing, Reach Forward with Outstretched Arm Can reach forward >12 cm safely (5")   From Standing Position, Pick up Object from Floor Able to pick up shoe, needs supervision   From Standing Position, Turn to Look Behind Over each Shoulder Looks behind from both sides and weight shifts well   Turn 360 Degrees Able to turn 360 degrees safely but slowly   Standing Unsupported, Alternately Place  Feet on Step/Stool Able to complete >2 steps/needs minimal assist   Standing Unsupported, One Foot in Front Able to take small step independently and hold 30 seconds   Standing on One Leg Tries to lift leg/unable to hold 3 seconds but remains standing independently   Total Score 44   Berg comment: 44/56 cane , RW at home and uses at times                      [facility] Adult PT Treatment/Exercise - 03/23/16 0957      Self-Care   Other Self-Care Comments  progress, positioning , HEP      Knee/Hip Exercises: Stretches   Hip Flexor Stretch Right;2 reps   Hip Flexor Stretch Limitations off table , 2 min with pain in back and hipo with this     Knee/Hip Exercises: Supine   Short Arc Quad Sets Strengthening;Right;1 set;10 reps   Other Supine Knee/Hip Exercises Glute set , Rt. LE decompression x 5 (leg lengthener )      Knee/Hip Exercises: Sidelying   Clams x 10 pain in Rt. hip      Moist Heat Therapy   Number Minutes Moist Heat 15 Minutes   Moist Heat Location Lumbar Spine     Manual Therapy   Manual therapy comments manual A to extend Rt. hip                 PT Education - 03/23/16 1134    Education provided Yes   Education Details reinforcement of Rt. hip and how it relates to low back, berg balance improvements, recommendations    Person(s) Educated Patient   Methods Explanation   Comprehension Verbalized understanding          PT Short Term Goals - 03/23/16 1000      PT SHORT TERM GOAL #1   Title Pt will be I with initial HEP and basic concepts of body mechanics, transfers.    Status Partially Met     PT SHORT TERM GOAL #2   Title Pt will tolerate supine with min increase in pain with mat Ex.    Status Achieved     PT SHORT TERM GOAL #3   Title Pt will report less pain in Rt. LE (25% less) with ADLs.    Status On-going     PT SHORT TERM GOAL #4   Title Balance screen, goal to be set    Status Achieved  PT Long Term Goals -  03/23/16 1000      PT LONG TERM GOAL #1   Title Pt will be I with HEP and posture, body mechanics.    Status On-going     PT LONG TERM GOAL #2   Title Pt will be able to do light housework, vacuum with min increase in back pain.    Baseline not for > 1 min , I do what I can    Status On-going     PT LONG TERM GOAL #3   Title Balance goal 32/56 to demo improvement in balance performance.    Baseline 44/56   Status Achieved     PT LONG TERM GOAL #4   Title Further goals to be set as pt progresses.    Status Unable to assess               Plan - 03/23/16 1003    Clinical Impression Statement Patient has not had back pain that limits her activity.  Rt hip pain and endurance/fatigue limits her mobility more than her back.  She had increased pain today, worked towards [place]. ant hip and strengthening hip ext /ABD .  Balance much improved! Pt POC ending next week, DC due to improvement in back pain despite continued chronic Rt. hip pain (post surgical)    PT Next Visit Plan see if she is I with HEP, prep for DC> GCODE ON VISIT 10 , foto not done   PT Home Exercise Plan LTR, S KTC and seated HS.  1/2 pelvic tilt, 3 stage dead bug. ANT HIP stretching off bed and in Rt. sidelying    Consulted and Agree with Plan of Care Patient      Patient will benefit from skilled therapeutic intervention in order to improve the following deficits and impairments:  Decreased range of motion, Difficulty walking, Obesity, Decreased endurance, Decreased activity tolerance, Pain, Improper body mechanics, Impaired flexibility, Decreased balance, Decreased mobility, Decreased strength, Increased edema, Postural dysfunction, Impaired sensation  Visit Diagnosis: Chronic bilateral low back pain with right-sided sciatica  Muscle weakness (generalized)  Other abnormalities of gait and mobility     Problem List Patient Active Problem List   Diagnosis Date Noted  . Fatigue 11/23/2015  .  Nausea and vomiting 11/23/2015  . Anemia 08/18/2015  . Angioedema 12/30/2014  . Status post total replacement of right hip 11/23/2014  . Osteoarthritis of right hip 10/13/2014  . DVT of lower extremity, bilateral ([place]) 04/04/2012  . Routine health maintenance 01/14/2012  . GERD (gastroesophageal reflux disease) 01/09/2012  . Urinary incontinence 12/31/2011  . Chronic kidney disease (CKD), stage III (moderate) 09/19/2010  . Pes planus, congenital 08/08/2010  . Chronic pelvic pain in female 02/03/2010  . Asthma, chronic 09/27/2009  . Elevated alkaline phosphatase level 08/01/2009  . Pain in joint, multiple sites 12/21/2008  . MAJOR DPRSV DISORDER RECURRENT EPISODE MODERATE 09/08/2008  . Gout 07/05/2008  . Hyperlipidemia 05/07/2006  . Coronary atherosclerosis 02/05/2006  . Morbid obesity ([place]) 12/13/2005  . History of alcohol abuse 12/13/2005  . SLEEP APNEA, OBSTRUCTIVE 12/13/2005  . Essential hypertension 12/13/2005    Karen Huhta 03/23/2016, 11:40 AM  [facility] [place] [address] [place], [place], [address] Phone: [phone]   Fax:  [phone]  Name: CHAREE TUMBLIN MRN: [id number] Date of Birth: Oct 31, 1950  Raeford Razor, PT 03/23/16 11:41 AM Phone: [phone] Fax: [phone]

## 2016-03-26 DIAGNOSIS — R609 Edema, unspecified: Secondary | ICD-10-CM | POA: Diagnosis not present

## 2016-03-27 ENCOUNTER — Ambulatory Visit: Payer: Medicare Other | Admitting: Physical Therapy

## 2016-03-28 DIAGNOSIS — J45901 Unspecified asthma with (acute) exacerbation: Secondary | ICD-10-CM | POA: Diagnosis not present

## 2016-03-28 DIAGNOSIS — M1990 Unspecified osteoarthritis, unspecified site: Secondary | ICD-10-CM | POA: Diagnosis not present

## 2016-03-28 DIAGNOSIS — R609 Edema, unspecified: Secondary | ICD-10-CM | POA: Diagnosis not present

## 2016-03-29 ENCOUNTER — Ambulatory Visit: Payer: Medicare Other | Attending: Physician Assistant | Admitting: Physical Therapy

## 2016-03-29 DIAGNOSIS — R2689 Other abnormalities of gait and mobility: Secondary | ICD-10-CM | POA: Diagnosis not present

## 2016-03-29 DIAGNOSIS — M6281 Muscle weakness (generalized): Secondary | ICD-10-CM | POA: Insufficient documentation

## 2016-03-29 DIAGNOSIS — G8929 Other chronic pain: Secondary | ICD-10-CM | POA: Diagnosis not present

## 2016-03-29 DIAGNOSIS — M5441 Lumbago with sciatica, right side: Secondary | ICD-10-CM | POA: Diagnosis not present

## 2016-03-29 NOTE — Therapy (Addendum)
[place], [place], [address] Phone: [phone]   Fax:  [phone]  Physical Therapy Treatment  Patient Details  Name: Vanessa Palmer MRN: [id number] Date of Birth: March 02, 1950 Referring Provider: Erskine Emery, PA  Encounter Date: 03/29/2016      PT End of Session - 03/29/16 0946    Visit Number 10   Number of Visits 16   Date for PT Re-Evaluation 03/30/16   PT Start Time 1657   PT Stop Time 0950   PT Time Calculation (min) 63 min   Activity Tolerance Patient tolerated treatment well   Behavior During Therapy [facility] for tasks assessed/performed      Past Medical History:  Diagnosis Date  . ALCOHOL ABUSE 12/13/2005   Annotation: Sober since 11/06 Qualifier: Diagnosis of  By: Pearline Cables MD, Belenda Cruise    . Anemia   . CAD (coronary artery disease)    s/p non-Q wave MI in 06/2001 and 2004, 2005  . CHF (congestive heart failure) ([place])   . Chronic kidney disease (CKD), stage III (moderate) 09/19/2010  . Depression   . DJD (degenerative joint disease)   . DVT of lower extremity, bilateral ([place]) 04/04/2012   On Xarelto    . GERD (gastroesophageal reflux disease)   . Gout   . Headache    migraines  . Hyperlipidemia   . Hypertension   . INTRINSIC ASTHMA, WITH EXACERBATION 09/27/2009   Qualifier: Diagnosis of  By: Stanford Scotland MD, Jiles Garter    . Obesity   . OSA (obstructive sleep apnea)    previously used CPAP, has lost  . Seizures ([place])    As a teenager     Past Surgical History:  Procedure Laterality Date  . CARDIAC CATHETERIZATION     06/2001, 02/2002, 10/2004 (10-15% proximal stenosis of circumflex, diffuse disease of  OM1 and RCA)  . COLONOSCOPY    . KNEE ARTHROSCOPY Left   . PARTIAL HYSTERECTOMY     due to endometriosis  . TOTAL HIP ARTHROPLASTY Right 11/23/2014   Procedure: RIGHT TOTAL HIP ARTHROPLASTY ANTERIOR APPROACH;  Surgeon: Mcarthur Rossetti, MD;  Location: [place] [facility];  Service: Orthopedics;   Laterality: Right;  . TUBAL LIGATION      There were no vitals filed for this visit.      Subjective Assessment - 03/29/16 0852    Subjective This is my last visit.  I have not had back pain since the last 2 visits.  I know all my exercises,  I walk in my hallway at home.                         [facility] Adult PT Treatment/Exercise - 03/29/16 0001      Lumbar Exercises: Aerobic   Stationary Bike Nustep 15 minutes for endurance, L5      Lumbar Exercises: Seated   Other Seated Lumbar Exercises march 10 x 2 sets with blue band   Other Seated Lumbar Exercises Hamstring curls 10 x each 10 x blue band     Knee/Hip Exercises: Stretches   Hip Flexor Stretch Limitations sidelying right gentle progressive stretch with moist heat. cramp eased,       Knee/Hip Exercises: Sidelying   Clams Lt sidelying clams 10 X  when rolling to the left spasm hit hamstrings ,  she strood anf stretched pain changed from hamstrings Lt to left rectus femoris and lower abdominals.  PT Carlus Pavlov assisted with assewssment and stretch into  hip extension.  cramp eased.     Moist Heat Therapy   Number Minutes Moist Heat 15 Minutes   Moist Heat Location --  left anterior hip in sidelying with light stretch.                  PT Short Term Goals - 03/29/16 0855      PT SHORT TERM GOAL #1   Title Pt will be I with initial HEP and basic concepts of body mechanics, transfers.    Baseline able and feels she can   Time 4   Period Weeks   Status Achieved     PT SHORT TERM GOAL #2   Title Pt will tolerate supine with min increase in pain with mat Ex.    Baseline able to do long enough to do her exercises   Time 4   Period Weeks   Status Achieved     PT SHORT TERM GOAL #3   Title Pt will report less pain in Rt. LE (25% less) with ADLs.    Baseline No leg pain  today, 50% improved   Time 4   Period Weeks   Status Achieved     PT SHORT TERM GOAL #4   Title Balance screen, goal to be  set    Status Achieved           PT Long Term Goals - 03/29/16 0858      PT LONG TERM GOAL #1   Title Pt will be I with HEP and posture, body mechanics.    Baseline independent,  uses good body mechanics at home   Time 8   Period Weeks   Status Achieved     PT LONG TERM GOAL #2   Title Pt will be able to do light housework, vacuum with min increase in back pain.    Baseline light cooking able, light housework able,  Unable to vacume do to gets out of breath.   Time 8   Period Weeks   Status Partially Met     PT LONG TERM GOAL #3   Baseline 44/56   Time 8   Period Weeks   Status Achieved               Plan - 03/29/16 0902    Clinical Impression Statement Patient is being discharged due to being happy with her progress,  She has not had back pain for a couple of weeks.  Her leg pain is improved 50%.  She gets leg cramps at night and has a severs on in clinic today,  fortunately hip extension stretch on side was helpful with cramp and it was gone. .All goals met except for LTG# 2 partially met.    PT Next Visit Plan Discharge to HEP at patient's request   PT Home Exercise Plan LTR, S KTC and seated HS.  1/2 pelvic tilt, 3 stage dead bug. ANT HIP stretching off bed and in Rt. sidelying    Consulted and Agree with Plan of Care Patient      Patient will benefit from skilled therapeutic intervention in order to improve the following deficits and impairments:  Decreased range of motion, Difficulty walking, Obesity, Decreased endurance, Decreased activity tolerance, Pain, Improper body mechanics, Impaired flexibility, Decreased balance, Decreased mobility, Decreased strength, Increased edema, Postural dysfunction, Impaired sensation  Visit Diagnosis: Chronic bilateral low back pain with right-sided sciatica  Muscle weakness (generalized)  Other abnormalities of gait and mobility  Problem List Patient Active Problem List   Diagnosis Date Noted  . Fatigue  11/23/2015  . Nausea and vomiting 11/23/2015  . Anemia 08/18/2015  . Angioedema 12/30/2014  . Status post total replacement of right hip 11/23/2014  . Osteoarthritis of right hip 10/13/2014  . DVT of lower extremity, bilateral ([place]) 04/04/2012  . Routine health maintenance 01/14/2012  . GERD (gastroesophageal reflux disease) 01/09/2012  . Urinary incontinence 12/31/2011  . Chronic kidney disease (CKD), stage III (moderate) 09/19/2010  . Pes planus, congenital 08/08/2010  . Chronic pelvic pain in female 02/03/2010  . Asthma, chronic 09/27/2009  . Elevated alkaline phosphatase level 08/01/2009  . Pain in joint, multiple sites 12/21/2008  . MAJOR DPRSV DISORDER RECURRENT EPISODE MODERATE 09/08/2008  . Gout 07/05/2008  . Hyperlipidemia 05/07/2006  . Coronary atherosclerosis 02/05/2006  . Morbid obesity ([place]) 12/13/2005  . History of alcohol abuse 12/13/2005  . SLEEP APNEA, OBSTRUCTIVE 12/13/2005  . Essential hypertension 12/13/2005    HARRIS,KAREN PTA 03/29/2016, 9:53 AM  [facility] [address] [place], [place], [address] Phone: [phone]   Fax:  [phone]  Name: Vanessa Palmer MRN: [id number] Date of Birth: [birth year]   PHYSICAL THERAPY DISCHARGE SUMMARY  Visits from Start of Care: 10  Current functional level related to goals / functional outcomes: See above for most recent info   Remaining deficits: Leg pain and cramps   Education / Equipment: HEP, posture and body mechanics Plan: Patient agrees to discharge.  Patient goals were met. Patient is being discharged due to meeting the stated rehab goals.  ?????    Raeford Razor, PT 05/10/16 3:11 PM Phone: [phone] Fax: [phone]

## 2016-04-03 ENCOUNTER — Ambulatory Visit: Payer: Medicare Other | Admitting: Physical Therapy

## 2016-04-05 ENCOUNTER — Ambulatory Visit: Payer: Medicare Other | Admitting: Physical Therapy

## 2016-04-09 ENCOUNTER — Other Ambulatory Visit: Payer: Self-pay | Admitting: Internal Medicine

## 2016-04-10 ENCOUNTER — Ambulatory Visit: Payer: Medicare Other | Admitting: Physical Therapy

## 2016-04-12 ENCOUNTER — Ambulatory Visit: Payer: Medicare Other | Admitting: Physical Therapy

## 2016-04-25 DIAGNOSIS — R609 Edema, unspecified: Secondary | ICD-10-CM | POA: Diagnosis not present

## 2016-05-01 ENCOUNTER — Ambulatory Visit (INDEPENDENT_AMBULATORY_CARE_PROVIDER_SITE_OTHER): Payer: Medicare Other | Admitting: Internal Medicine

## 2016-05-01 ENCOUNTER — Encounter: Payer: Self-pay | Admitting: Internal Medicine

## 2016-05-01 VITALS — BP 156/93 | HR 60 | Temp 98.0°F | Ht 62.0 in | Wt 239.8 lb

## 2016-05-01 DIAGNOSIS — R748 Abnormal levels of other serum enzymes: Secondary | ICD-10-CM | POA: Diagnosis not present

## 2016-05-01 DIAGNOSIS — Z79891 Long term (current) use of opiate analgesic: Secondary | ICD-10-CM

## 2016-05-01 DIAGNOSIS — M2550 Pain in unspecified joint: Secondary | ICD-10-CM

## 2016-05-01 DIAGNOSIS — M549 Dorsalgia, unspecified: Secondary | ICD-10-CM

## 2016-05-01 DIAGNOSIS — M25552 Pain in left hip: Secondary | ICD-10-CM

## 2016-05-01 DIAGNOSIS — M25562 Pain in left knee: Secondary | ICD-10-CM

## 2016-05-01 DIAGNOSIS — N183 Chronic kidney disease, stage 3 unspecified: Secondary | ICD-10-CM

## 2016-05-01 DIAGNOSIS — M25462 Effusion, left knee: Secondary | ICD-10-CM

## 2016-05-01 DIAGNOSIS — Z79899 Other long term (current) drug therapy: Secondary | ICD-10-CM

## 2016-05-01 DIAGNOSIS — I82503 Chronic embolism and thrombosis of unspecified deep veins of lower extremity, bilateral: Secondary | ICD-10-CM | POA: Diagnosis not present

## 2016-05-01 DIAGNOSIS — G8929 Other chronic pain: Secondary | ICD-10-CM | POA: Diagnosis not present

## 2016-05-01 DIAGNOSIS — J45901 Unspecified asthma with (acute) exacerbation: Secondary | ICD-10-CM | POA: Diagnosis not present

## 2016-05-01 DIAGNOSIS — M25561 Pain in right knee: Secondary | ICD-10-CM

## 2016-05-01 DIAGNOSIS — M25551 Pain in right hip: Secondary | ICD-10-CM | POA: Diagnosis not present

## 2016-05-01 DIAGNOSIS — I10 Essential (primary) hypertension: Secondary | ICD-10-CM

## 2016-05-01 DIAGNOSIS — R609 Edema, unspecified: Secondary | ICD-10-CM | POA: Diagnosis not present

## 2016-05-01 DIAGNOSIS — G4762 Sleep related leg cramps: Secondary | ICD-10-CM | POA: Diagnosis not present

## 2016-05-01 DIAGNOSIS — Z7901 Long term (current) use of anticoagulants: Secondary | ICD-10-CM

## 2016-05-01 DIAGNOSIS — I129 Hypertensive chronic kidney disease with stage 1 through stage 4 chronic kidney disease, or unspecified chronic kidney disease: Secondary | ICD-10-CM | POA: Diagnosis not present

## 2016-05-01 DIAGNOSIS — F331 Major depressive disorder, recurrent, moderate: Secondary | ICD-10-CM

## 2016-05-01 DIAGNOSIS — R269 Unspecified abnormalities of gait and mobility: Secondary | ICD-10-CM | POA: Diagnosis not present

## 2016-05-01 DIAGNOSIS — M1990 Unspecified osteoarthritis, unspecified site: Secondary | ICD-10-CM | POA: Diagnosis not present

## 2016-05-01 DIAGNOSIS — R143 Flatulence: Secondary | ICD-10-CM

## 2016-05-01 DIAGNOSIS — I82403 Acute embolism and thrombosis of unspecified deep veins of lower extremity, bilateral: Secondary | ICD-10-CM

## 2016-05-01 MED ORDER — OXYCODONE-ACETAMINOPHEN 10-325 MG PO TABS: 1.0000 | ORAL_TABLET | Freq: Three times a day (TID) | ORAL | 0 refills | Status: DC | PRN

## 2016-05-01 MED ORDER — AMLODIPINE BESYLATE 10 MG PO TABS: 10.0000 mg | ORAL_TABLET | Freq: Every day | ORAL | 0 refills | Status: DC

## 2016-05-01 NOTE — Assessment & Plan Note (Signed)
She has chronic pain, mainly in her hips and knees reported today.  She utilizes pain medication to help her remain active and to complete her ADLs.  She has no constipation or over sedation reported today.  She has been on a stable dose for a long while.  I reviewed 12 months of the [place] narcotic database today and it was appropriate.  UDS at last visit was appropriate.   Plan Continue percocet at current dose.  3 Rx were provided today.

## 2016-05-01 NOTE — Assessment & Plan Note (Signed)
She has no urinary complaints today, but is worried that her kidneys may be worse, so would like them checked.  She has elevated BP today and we have increased her BP medication.  Her CKD is likely related to long term hypertension.   Plan Better BP control Check CMET today.

## 2016-05-01 NOTE — Assessment & Plan Note (Addendum)
She reports having an "ok" mood and occasional sadness.  She is on Wellbutrin without ADE reported today.  PHQ-2 was 0 today.   Plan Continue wellbutrin.

## 2016-05-01 NOTE — Assessment & Plan Note (Signed)
BP was elevated today to 156/93 on recheck.  She has comorbidities including CKD which would require a lower goal for her BP.  She is not clear on what she takes.  She is unsure if she takes metoprolol, but states that she does take her amlodipine and lasix.   Plan Bring in medications at next visit Increase amlodipine to 10mg  daily Check renal function

## 2016-05-01 NOTE — Assessment & Plan Note (Signed)
We will check Vitamin D and K today.    I advised her to try B complex vitamins  She can also do stretching of the calves before bedtime.

## 2016-05-01 NOTE — Assessment & Plan Note (Signed)
At last check was in normal range, however, has fluctuated for many years.  Only GI symptom today is flatulence.   Plan Check GGT and vitamin D levels

## 2016-05-01 NOTE — Patient Instructions (Addendum)
Vanessa Palmer - -  It was a pleasure to see you today.    For your gassiness - please try to eliminate cheese from your diet and see if this helps.  You can also try over the counter medications like Bean-o and Gas-X to see if this helps.   For your leg cramping, I am going to check a potassium and vitamin D level today.  You can take B complex vitamins over the counter to see if this helps.    For your blood pressure, please INCREASE your amlodipine to 10mg  daily.  Please keep taking your metoprolol daily.    Please come back and see me in 3 months for your blood pressure.   Please bring ALL of your medication so we can review them.    Please keep taking all of your medications as prescribed.    Thank you!

## 2016-05-01 NOTE — Assessment & Plan Note (Signed)
This is a chronic issue for her and she is on lifelong xarelto. She has some chronic venous swelling which is worse with walking and better with lying flat.  She has no pitting edema on exam today.   Plan Continue to raise legs when seated Continue lasix as needed Continue Xarelto.

## 2016-05-01 NOTE — Assessment & Plan Note (Signed)
Based on my discussion with her, she may have lactose intolerance.  I advised her to try and eliminate this from her diet in the form of cheese and see if it helps.  If not, she can try beano or gas-x  Reassess at next visit.

## 2016-05-01 NOTE — Progress Notes (Signed)
   Subjective:    Patient ID: Vanessa Palmer, female    DOB: 13-Jul-1950, 66 y.o.   MRN: [id number]  CC: 3 month follow up for HTN, chronic pain  HPI   [name] is a 66yo woman with PMH of Asthma, CKD, CAD, DVT on lifelong xarelto, HTN, HLD, OA and chronic pain, MDD, anemia who presents for routine follow up.    [name] reports that she is doing relatively well.  She notes 2 issues which have arisen, one is feeling more gassy and the other is nocturnal cramping.    For the gassy feeling, she reports after eating most anything, she will have gas which is embarrassing to her, particularly in church.  She feels it is after every meal.  She has no belly pain, constipation, diarrhea.  She has no bloating.  She does not have nausea, vomiting.  We discussed her diet and she notes that she eats a lot of cheese and tries to eat vegetables.  We discussed trying OTC medications to see if this helps and/or eliminating cheese from her diet to see if this helps.   For the nocturnal cramping, she notes it is worst when she lies down to go to bed.  She has trouble falling asleep.  The cramps also awaken her from sleep and she has to get up to stretch her legs out.  This does not happen during the day.    For her other medical issues, she reports no chest pain, no fever, chills, headache, palpitations.  She continues to have occasional LE swelling when she stands for while that is improved with rest.  She also continues to have back pain (but improved with PT) and knee pain for which she takes oxycodone through this clinic.    Her BP was initially elevated to 166/88 and then on recheck was 156/93.  I am never quite clear if [name] knows what medications she has at home or what she is taking.  I have had difficulty in getting her to bring her medication bottles in to the clinic.     Review of Systems  Constitutional: Negative for chills and fatigue.  Respiratory: Positive for shortness of breath  (with strenuous activity). Negative for cough, choking and wheezing.   Cardiovascular: Positive for leg swelling (occasional). Negative for chest pain.  Gastrointestinal: Negative for anal bleeding, blood in stool, nausea and vomiting.       Gassiness  Musculoskeletal: Positive for arthralgias, back pain and gait problem (uses cane).  Neurological: Positive for dizziness (occasional). Negative for weakness, light-headedness and headaches.       Objective:   Physical Exam  Constitutional: She is oriented to person, place, and time. She appears well-developed and well-nourished. No distress.  HENT:  Head: Normocephalic and atraumatic.  Cardiovascular: Normal rate, regular rhythm and normal heart sounds.   No murmur heard. Pulmonary/Chest: Effort normal and breath sounds normal. No respiratory distress.  Abdominal: Soft. Bowel sounds are normal. There is no tenderness.  Musculoskeletal: She exhibits tenderness (to knees bilaterally, mild effusion in left knee.  no crepitus bilaterally). She exhibits no edema.  Neurological: She is alert and oriented to person, place, and time.  Psychiatric: She has a normal mood and affect. Her behavior is normal.    CMET, GGT, vitamin D today      Assessment & Plan:  RTC in 3 months for BP check.

## 2016-05-02 LAB — CMP14 + ANION GAP
A/G RATIO: 1.4 (ref 1.2–2.2)
ALBUMIN: 4.1 g/dL (ref 3.6–4.8)
ALT: 9 IU/L (ref 0–32)
AST: 17 IU/L (ref 0–40)
Alkaline Phosphatase: 176 IU/L — ABNORMAL HIGH (ref 39–117)
Anion Gap: 16 mmol/L (ref 10.0–18.0)
BILIRUBIN TOTAL: 0.6 mg/dL (ref 0.0–1.2)
BUN / CREAT RATIO: 16 (ref 12–28)
BUN: 22 mg/dL (ref 8–27)
CALCIUM: 8.6 mg/dL — AB (ref 8.7–10.3)
CHLORIDE: 100 mmol/L (ref 96–106)
CO2: 22 mmol/L (ref 18–29)
Creatinine, Ser: 1.4 mg/dL — ABNORMAL HIGH (ref 0.57–1.00)
GFR calc Af Amer: 46 mL/min/{1.73_m2} — ABNORMAL LOW (ref >59)
GFR, EST NON AFRICAN AMERICAN: 40 mL/min/{1.73_m2} — AB (ref >59)
GLUCOSE: 82 mg/dL (ref 65–99)
Globulin, Total: 2.9 g/dL (ref 1.5–4.5)
Potassium: 3.9 mmol/L (ref 3.5–5.2)
Sodium: 138 mmol/L (ref 134–144)
TOTAL PROTEIN: 7 g/dL (ref 6.0–8.5)

## 2016-05-02 LAB — VITAMIN D 25 HYDROXY (VIT D DEFICIENCY, FRACTURES): Vit D, 25-Hydroxy: 11.2 ng/mL — ABNORMAL LOW (ref 30.0–100.0)

## 2016-05-02 LAB — GAMMA GT: GGT: 16 IU/L (ref 0–60)

## 2016-05-03 ENCOUNTER — Encounter (HOSPITAL_COMMUNITY): Payer: Self-pay

## 2016-05-03 ENCOUNTER — Emergency Department (HOSPITAL_COMMUNITY)
Admission: EM | Admit: 2016-05-03 | Discharge: 2016-05-03 | Disposition: A | Discharge status: Home / Self Care | Payer: Medicare Other | Attending: Emergency Medicine | Admitting: Emergency Medicine

## 2016-05-03 ENCOUNTER — Other Ambulatory Visit: Payer: Self-pay

## 2016-05-03 ENCOUNTER — Emergency Department (HOSPITAL_COMMUNITY): Payer: Medicare Other

## 2016-05-03 DIAGNOSIS — Z96641 Presence of right artificial hip joint: Secondary | ICD-10-CM | POA: Insufficient documentation

## 2016-05-03 DIAGNOSIS — F419 Anxiety disorder, unspecified: Secondary | ICD-10-CM | POA: Diagnosis not present

## 2016-05-03 DIAGNOSIS — N183 Chronic kidney disease, stage 3 (moderate): Secondary | ICD-10-CM | POA: Diagnosis not present

## 2016-05-03 DIAGNOSIS — I2510 Atherosclerotic heart disease of native coronary artery without angina pectoris: Secondary | ICD-10-CM | POA: Insufficient documentation

## 2016-05-03 DIAGNOSIS — Z91010 Allergy to peanuts: Secondary | ICD-10-CM | POA: Insufficient documentation

## 2016-05-03 DIAGNOSIS — I509 Heart failure, unspecified: Secondary | ICD-10-CM | POA: Insufficient documentation

## 2016-05-03 DIAGNOSIS — R079 Chest pain, unspecified: Secondary | ICD-10-CM | POA: Diagnosis not present

## 2016-05-03 DIAGNOSIS — F10129 Alcohol abuse with intoxication, unspecified: Secondary | ICD-10-CM | POA: Insufficient documentation

## 2016-05-03 DIAGNOSIS — I130 Hypertensive heart and chronic kidney disease with heart failure and stage 1 through stage 4 chronic kidney disease, or unspecified chronic kidney disease: Secondary | ICD-10-CM | POA: Diagnosis not present

## 2016-05-03 DIAGNOSIS — F10920 Alcohol use, unspecified with intoxication, uncomplicated: Secondary | ICD-10-CM

## 2016-05-03 DIAGNOSIS — Z7901 Long term (current) use of anticoagulants: Secondary | ICD-10-CM | POA: Insufficient documentation

## 2016-05-03 LAB — COMPREHENSIVE METABOLIC PANEL
ALT: 11 U/L — ABNORMAL LOW (ref 14–54)
AST: 20 U/L (ref 15–41)
Albumin: 4.1 g/dL (ref 3.5–5.0)
Alkaline Phosphatase: 168 U/L — ABNORMAL HIGH (ref 38–126)
Anion gap: 9 (ref 5–15)
BUN: 24 mg/dL — ABNORMAL HIGH (ref 6–20)
CO2: 22 mmol/L (ref 22–32)
Calcium: 8.7 mg/dL — ABNORMAL LOW (ref 8.9–10.3)
Chloride: 106 mmol/L (ref 101–111)
Creatinine, Ser: 1.22 mg/dL — ABNORMAL HIGH (ref 0.44–1.00)
GFR calc Af Amer: 53 mL/min — ABNORMAL LOW (ref >60)
GFR calc non Af Amer: 46 mL/min — ABNORMAL LOW (ref >60)
Glucose, Bld: 111 mg/dL — ABNORMAL HIGH (ref 65–99)
Potassium: 3.4 mmol/L — ABNORMAL LOW (ref 3.5–5.1)
Sodium: 137 mmol/L (ref 135–145)
Total Bilirubin: 0.8 mg/dL (ref 0.3–1.2)
Total Protein: 8.2 g/dL — ABNORMAL HIGH (ref 6.5–8.1)

## 2016-05-03 LAB — CBC
HCT: 35.9 % — ABNORMAL LOW (ref 36.0–46.0)
Hemoglobin: 12.4 g/dL (ref 12.0–15.0)
MCH: 30.8 pg (ref 26.0–34.0)
MCHC: 34.5 g/dL (ref 30.0–36.0)
MCV: 89.1 fL (ref 78.0–100.0)
Platelets: 215 10*3/uL (ref 150–400)
RBC: 4.03 MIL/uL (ref 3.87–5.11)
RDW: 13.3 % (ref 11.5–15.5)
WBC: 5.7 10*3/uL (ref 4.0–10.5)

## 2016-05-03 LAB — I-STAT TROPONIN, ED: Troponin i, poc: 0 ng/mL (ref 0.00–0.08)

## 2016-05-03 LAB — ACETAMINOPHEN LEVEL: Acetaminophen (Tylenol), Serum: <10 ug/mL — ABNORMAL LOW (ref 10–30)

## 2016-05-03 LAB — ETHANOL: Alcohol, Ethyl (B): 203 mg/dL — ABNORMAL HIGH (ref <5)

## 2016-05-03 LAB — SALICYLATE LEVEL: Salicylate Lvl: <7 mg/dL (ref 2.8–30.0)

## 2016-05-03 NOTE — ED Provider Notes (Signed)
[facility]-EMERGENCY DEPT Provider Note    By signing my name below, I, Earmon PhoenixJennifer Waddell, attest that this documentation has been prepared under the direction and in the presence of Raeford RazorStephen Amarissa Koerner, MD. Electronically Signed: Earmon PhoenixJennifer Waddell, ED Scribe. 05/03/16. 9:50 PM.    History   Chief Complaint Chief Complaint  Patient presents with  . Suicidal  . Chest Pain  . Alcohol Intoxication   The history is provided by the patient, medical records and a relative. No language interpreter was used.    HPI Comments:  Vanessa Palmer is an obese 66 y.o. female with PMHx of HLD, major depressive disorder, alcohol abuse, HTN, CAD, CKD, brought in by EMS, who presents to the Emergency Department with alcohol intoxication. She reports associated CP which has now resolved. She states she is having difficulty at the place that she is living and became angry and told someone that she wanted to hurt herself because she does not want to live in that location any longer, prompting them to call EMS. Her granddaughter states she has episodes like this when she gets upset. Her plan is to go home with her daughter and states she wants to be discharged. She denies previous suicide attempts. She denies wanting to harm herself or anyone else.   Past Medical History:  Diagnosis Date  . ALCOHOL ABUSE 12/13/2005   Annotation: Sober since 11/06 Qualifier: Diagnosis of  By: Wallace CullensGray MD, Natalia LeatherwoodKatherine    . Anemia   . CAD (coronary artery disease)    s/p non-Q wave MI in 06/2001 and 2004, 2005  . CHF (congestive heart failure) (HCC)   . Chronic kidney disease (CKD), stage III (moderate) 09/19/2010  . Depression   . DJD (degenerative joint disease)   . DVT of lower extremity, bilateral (HCC) 04/04/2012   On Xarelto    . GERD (gastroesophageal reflux disease)   . Gout   . Headache    migraines  . Hyperlipidemia   . Hypertension   . INTRINSIC ASTHMA, WITH EXACERBATION 09/27/2009   Qualifier: Diagnosis of  By: Denton MeekKarimova MD,  Tillie RungNodira    . Obesity   . OSA (obstructive sleep apnea)    previously used CPAP, has lost  . Seizures (HCC)    As a teenager     Patient Active Problem List   Diagnosis Date Noted  . Flatulence 05/01/2016  . Anemia 08/18/2015  . Status post total replacement of right hip 11/23/2014  . Osteoarthritis of right hip 10/13/2014  . Nocturnal leg cramps 01/06/2014  . DVT of lower extremity, bilateral (HCC) 04/04/2012  . Routine health maintenance 01/14/2012  . GERD (gastroesophageal reflux disease) 01/09/2012  . Urinary incontinence 12/31/2011  . Chronic kidney disease (CKD), stage III (moderate) 09/19/2010  . Pes planus, congenital 08/08/2010  . Chronic pelvic pain in female 02/03/2010  . Asthma, chronic 09/27/2009  . Elevated alkaline phosphatase level 08/01/2009  . Pain in joint, multiple sites 12/21/2008  . MAJOR DPRSV DISORDER RECURRENT EPISODE MODERATE 09/08/2008  . Gout 07/05/2008  . Hyperlipidemia 05/07/2006  . Coronary atherosclerosis 02/05/2006  . Morbid obesity (HCC) 12/13/2005  . History of alcohol abuse 12/13/2005  . SLEEP APNEA, OBSTRUCTIVE 12/13/2005  . Essential hypertension 12/13/2005    Past Surgical History:  Procedure Laterality Date  . CARDIAC CATHETERIZATION     06/2001, 02/2002, 10/2004 (10-15% proximal stenosis of circumflex, diffuse disease of  OM1 and RCA)  . COLONOSCOPY    . KNEE ARTHROSCOPY Left   . PARTIAL HYSTERECTOMY  due to endometriosis  . TOTAL HIP ARTHROPLASTY Right 11/23/2014   Procedure: RIGHT TOTAL HIP ARTHROPLASTY ANTERIOR APPROACH;  Surgeon: Kathryne Hitch, MD;  Location: [facility] OR;  Service: Orthopedics;  Laterality: Right;  . TUBAL LIGATION      OB History    No data available       Home Medications    Prior to Admission medications   Medication Sig Start Date End Date Taking? Authorizing Provider  acetaminophen (TYLENOL) 500 MG tablet Take 1,000 mg by mouth every 6 (six) hours as needed for moderate pain.    Historical  Provider, MD  amLODipine (NORVASC) 10 MG tablet Take 1 tablet (10 mg total) by mouth daily. 05/01/16   Inez Catalina, MD  atorvastatin (LIPITOR) 40 MG tablet TAKE 1 TABLET (40 MG TOTAL) BY MOUTH DAILY. 04/10/16   Inez Catalina, MD  buPROPion (WELLBUTRIN XL) 150 MG 24 hr tablet Take 1 tablet (150 mg total) by mouth every morning. 08/11/14   Inez Catalina, MD  famotidine (PEPCID) 20 MG tablet Take 1 tablet (20 mg total) by mouth 2 (two) times daily. 08/11/14   Inez Catalina, MD  fluticasone (FLONASE) 50 MCG/ACT nasal spray INSTILL TWO   SPRAYS IN EACH NOSTRIL ONCE DAILY Patient taking differently: INSTILL TWO   SPRAYS IN EACH NOSTRIL ONCE DAILY AS NEEDED FOR ALLERGIES 11/22/15   Inez Catalina, MD  fluticasone (FLOVENT HFA) 44 MCG/ACT inhaler Inhale 2 puffs into the lungs 2 (two) times daily. Patient taking differently: Inhale 2 puffs into the lungs 2 (two) times daily as needed (shortness of breath).  06/02/13   Pasty Spillers McLean-Scocuzza, MD  furosemide (LASIX) 20 MG tablet TAKE TWO   TABLETS (40 MG TOTAL) BY MOUTH DAILY. 03/13/16   Inez Catalina, MD  methocarbamol (ROBAXIN) 500 MG tablet Take 1 tablet (500 mg total) by mouth every 8 (eight) hours as needed for muscle spasms. 01/23/16   Kirtland Bouchard, PA-C  metoprolol (LOPRESSOR) 50 MG tablet TAKE 1 TABLET (50 MG TOTAL) BY MOUTH DAILY. 08/07/15   Inez Catalina, MD  nitroGLYCERIN (NITROSTAT) 0.4 MG SL tablet Place 0.4 mg under the tongue every 5 (five) minutes as needed for chest pain.    Historical Provider, MD  omeprazole (PRILOSEC) 40 MG capsule TAKE 1 CAPSULE (40 MG TOTAL) BY MOUTH DAILY. 02/09/16   Inez Catalina, MD  ondansetron (ZOFRAN) 4 MG tablet Take 1 tablet (4 mg total) by mouth every 8 (eight) hours as needed for nausea or vomiting. 11/23/15   Inez Catalina, MD  oxybutynin (DITROPAN) 5 MG tablet Take 1 tablet (5 mg total) by mouth 2 (two) times daily. 11/23/15   Inez Catalina, MD  oxyCODONE-acetaminophen (PERCOCET) 10-325 MG tablet Take 1  tablet by mouth every 8 (eight) hours as needed for pain. 05/01/16 04/08/17  Inez Catalina, MD  rivaroxaban (XARELTO) 20 MG TABS tablet Take 1 tablet (20 mg total) by mouth daily with supper. 06/13/15   Doneen Poisson, MD  traZODone (DESYREL) 50 MG tablet TAKE 0.5 TABLETS (25 MG TOTAL) BY MOUTH AT BEDTIME. 04/30/15   Inez Catalina, MD    Family History Family History  Problem Relation Age of Onset  . Mental illness Mother   . Heart disease Father   . Diabetes Sister   . Diabetes Sister   . Diabetes Sister     Social History Social History  Substance Use Topics  . Smoking status: Never Smoker  . Smokeless  tobacco: Never Used  . Alcohol use 1.8 oz/week    3 Shots of liquor per week     Allergies   Ace inhibitors; Lexiscan [regadenoson]; Peanuts [peanut oil]; Hydromorphone hcl; Percocet [oxycodone-acetaminophen]; Propoxyphene n-acetaminophen; and Vicodin [hydrocodone-acetaminophen]   Review of Systems Review of Systems A complete 10 system review of systems was obtained and all systems are negative except as noted in the HPI and PMH.    Physical Exam Updated Vital Signs BP 118/98   Pulse 64   Temp 97.9 F (36.6 C) (Oral)   Resp 18   Ht 5\' 2"  (1.575 m)   Wt 230 lb (104.3 kg)   SpO2 100%   BMI 42.07 kg/m   Physical Exam  Constitutional: She is oriented to person, place, and time. She appears well-developed and well-nourished. No distress.  HENT:  Head: Normocephalic and atraumatic.  Eyes: EOM are normal.  Neck: Normal range of motion.  Cardiovascular: Normal rate, regular rhythm and normal heart sounds.   Pulmonary/Chest: Effort normal and breath sounds normal.  Abdominal: Soft. She exhibits no distension. There is no tenderness.  Musculoskeletal: Normal range of motion.  Neurological: She is alert and oriented to person, place, and time.  Skin: Skin is warm and dry.  Psychiatric:  Upset and crying. Able to calm herself down with the help of family member. Speech is  somewhat hesitant and perseverating on events at her residence but can be redirected.  Nursing note and vitals reviewed.    ED Treatments / Results  DIAGNOSTIC STUDIES: Oxygen Saturation is 100% on RA, normal by my interpretation.   COORDINATION OF CARE: 9:46 PM- Will discharge pt home. Pt verbalizes understanding and agrees to plan.  Medications - No data to display  Labs (all labs ordered are listed, but only abnormal results are displayed) Labs Reviewed  COMPREHENSIVE METABOLIC PANEL  ETHANOL  SALICYLATE LEVEL  ACETAMINOPHEN LEVEL  CBC  RAPID URINE DRUG SCREEN, HOSP PERFORMED  I-STAT TROPOININ, ED    EKG  EKG Interpretation None       Radiology Dg Chest 2 View  Result Date: 05/03/2016 CLINICAL DATA:  Left-sided chest pain EXAM: CHEST  2 VIEW COMPARISON:  06/18/2015 FINDINGS: The heart size and mediastinal contours are within normal limits. Both lungs are clear. Degenerative changes of the spine. IMPRESSION: No active cardiopulmonary disease. Electronically Signed   By: Jasmine Pang M.D.   On: 05/03/2016 21:12    Procedures Procedures (including critical care time)  Medications Ordered in ED Medications - No data to display   Initial Impression / Assessment and Plan / ED Course  I have reviewed the triage vital signs and the nursing notes.  Pertinent labs & imaging results that were available during my care of the patient were reviewed by me and considered in my medical decision making (see chart for details).      Final Clinical Impressions(s) / ED Diagnoses   Final diagnoses:  Acute alcoholic intoxication without complication [facility])  Anxiety    New Prescriptions New Prescriptions   No medications on file     Raeford Razor, MD 05/15/16 1350

## 2016-05-03 NOTE — ED Triage Notes (Signed)
PT RECEIVED VIA EMS FOR MAKING SUICIDAL THREATS AND ALCOHOL INTOXICATION. PT STS SHE HAD 3 DRINKS OF WHITE LIQUOR THE SOME FRIENDS TONIGHT,AND SOME PAST HAPPENINGS WERE BROUGHT UP THAT MADE HER REALLY UPSET. PT STS, I SAID I DIDN'T WANT TO LIVE ANYMORE BECAUSE I'M TIRED OF EVERYTHING. I JUST FED UP. I WANT TO TALK TO MY DAUGHTER." PT ALSO C/O LEFT-SIDED CHEST  PAIN. DENIES SOB. PT CRYING AND SOBING IN TRIAGE.

## 2016-05-03 NOTE — ED Notes (Addendum)
Pt seen by dr Juleen Chinakohut who advised that she will not be IVC'ed if she leaves and that he will d/c her. Pt left before getting d/c instructions, family members stated they are going to watch after her tonight.

## 2016-05-04 ENCOUNTER — Telehealth: Payer: Self-pay | Admitting: *Deleted

## 2016-05-04 ENCOUNTER — Other Ambulatory Visit: Payer: Self-pay | Admitting: Internal Medicine

## 2016-05-04 DIAGNOSIS — Z8639 Personal history of other endocrine, nutritional and metabolic disease: Secondary | ICD-10-CM | POA: Insufficient documentation

## 2016-05-04 DIAGNOSIS — E559 Vitamin D deficiency, unspecified: Secondary | ICD-10-CM | POA: Insufficient documentation

## 2016-05-04 MED ORDER — VITAMIN D (ERGOCALCIFEROL) 1.25 MG (50000 UNIT) PO CAPS: 50000.0000 [IU] | ORAL_CAPSULE | ORAL | 0 refills | Status: DC

## 2016-05-04 NOTE — Telephone Encounter (Signed)
-----   Message from Inez CatalinaEmily B Mullen, MD sent at 05/04/2016  1:17 PM EST ----- Rivka BarbaraGlenda - -  Can you please call Ms. Luo and let her know that her Vitamin D is low and I recommend taking vitamin D (ergocalciferol) 50,000 Units once a WEEK for 8 weeks and then following up with me in 2-3 months.  Rx sent to pharmacy.   Thank you!

## 2016-05-04 NOTE — Telephone Encounter (Signed)
Called pt - informed Vanessa Palmer is low and Vanessa Palmer has prescribed Vanessa D once a week for 8 weeks. Voiced understanding. And need f/u visit in 2-3 months; stated she will call back to schedule the appt.

## 2016-05-04 NOTE — Progress Notes (Signed)
Very low Vitamin D and high ALP.  Prescribe Vitamin D once per week for 8 weeks and recheck.

## 2016-05-13 IMAGING — CR DG HIP (WITH OR WITHOUT PELVIS) 1V PORT*R*
2 series · 2 of 2 positions shown · non-contrast
Comparison: None.

CLINICAL DATA: Right hip replacement

EXAM:
DG HIP (WITH OR WITHOUT PELVIS) 1V PORT RIGHT

[AP (1 of 2)]
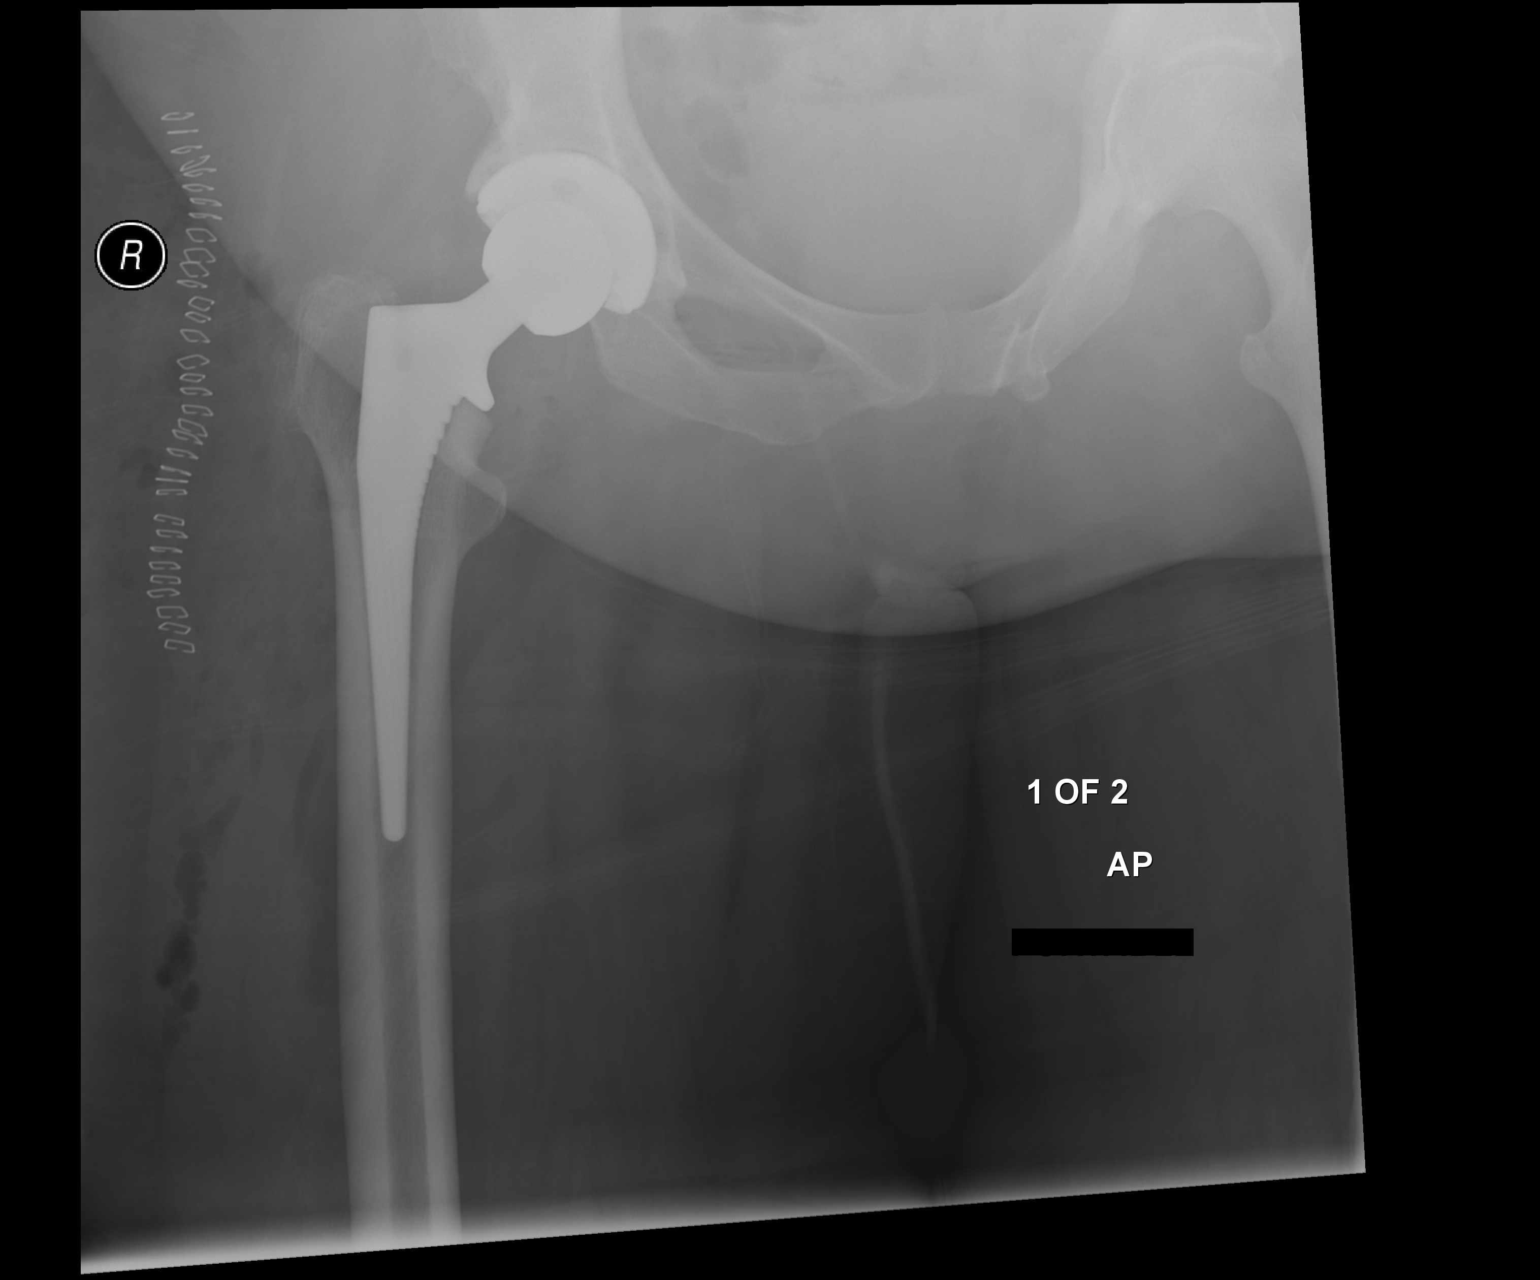

[AP (2 of 2)]
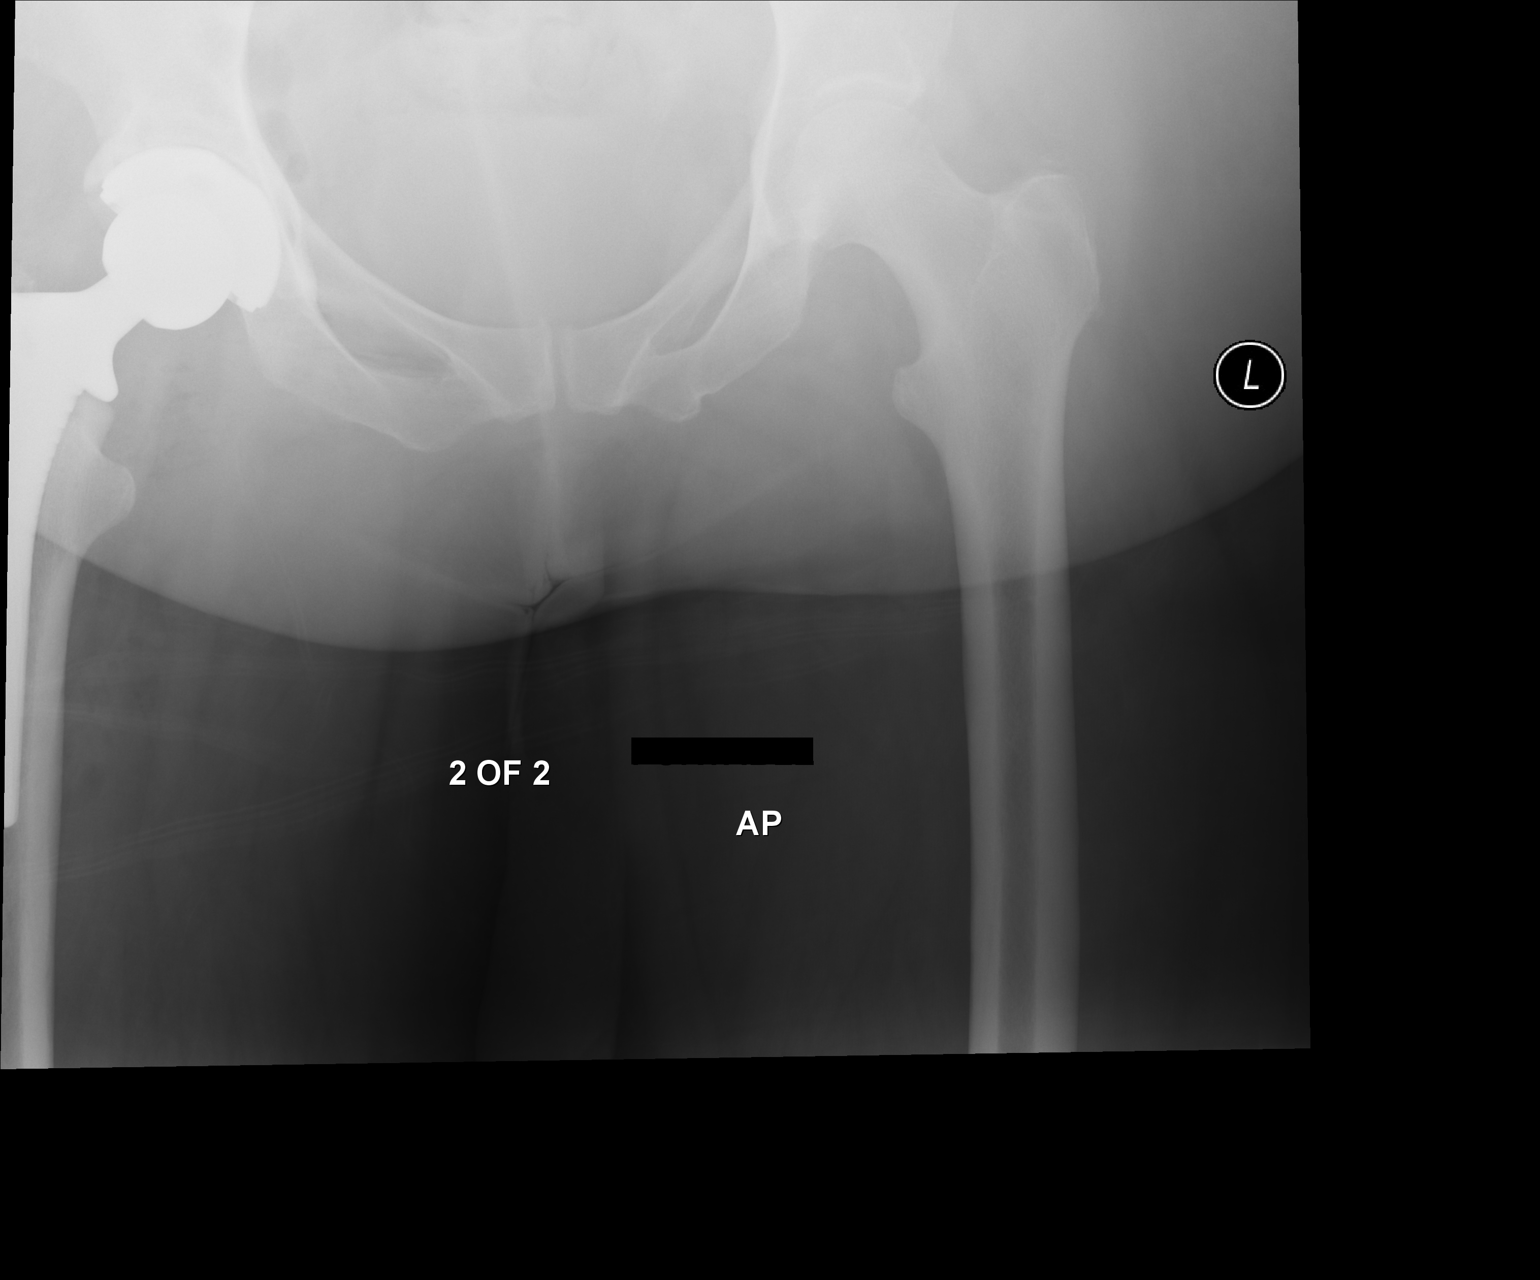

[2 of 2 positions shown; findings below may reference images not displayed]

FINDINGS: There is a total right hip arthroplasty without failure or
complication. There is no acute fracture or dislocation. There are
postsurgical changes around the right hip.
IMPRESSION: Right total hip arthroplasty.

## 2016-05-15 ENCOUNTER — Telehealth: Payer: Self-pay

## 2016-05-15 NOTE — Telephone Encounter (Signed)
Called pt and pharmacy, pharmacy will deliver med tomorrow, pt is agreeable

## 2016-05-15 NOTE — Telephone Encounter (Signed)
Need to speak with a nurse about vitamin d med. Please call back.

## 2016-05-24 DIAGNOSIS — R609 Edema, unspecified: Secondary | ICD-10-CM | POA: Diagnosis not present

## 2016-06-07 ENCOUNTER — Telehealth: Payer: Self-pay | Admitting: Dietician

## 2016-06-12 ENCOUNTER — Encounter: Payer: Self-pay | Admitting: Dietician

## 2016-06-12 NOTE — Telephone Encounter (Signed)
Have not heard back from patient. Will mail her a letter reminding her to schedule a follow up visit

## 2016-06-14 DIAGNOSIS — E785 Hyperlipidemia, unspecified: Secondary | ICD-10-CM | POA: Diagnosis not present

## 2016-06-14 DIAGNOSIS — N189 Chronic kidney disease, unspecified: Secondary | ICD-10-CM | POA: Diagnosis not present

## 2016-06-14 DIAGNOSIS — I252 Old myocardial infarction: Secondary | ICD-10-CM | POA: Diagnosis not present

## 2016-06-14 DIAGNOSIS — I129 Hypertensive chronic kidney disease with stage 1 through stage 4 chronic kidney disease, or unspecified chronic kidney disease: Secondary | ICD-10-CM | POA: Diagnosis not present

## 2016-06-14 DIAGNOSIS — I25118 Atherosclerotic heart disease of native coronary artery with other forms of angina pectoris: Secondary | ICD-10-CM | POA: Diagnosis not present

## 2016-06-21 DIAGNOSIS — M1990 Unspecified osteoarthritis, unspecified site: Secondary | ICD-10-CM | POA: Diagnosis not present

## 2016-06-21 DIAGNOSIS — J45901 Unspecified asthma with (acute) exacerbation: Secondary | ICD-10-CM | POA: Diagnosis not present

## 2016-06-21 DIAGNOSIS — R609 Edema, unspecified: Secondary | ICD-10-CM | POA: Diagnosis not present

## 2016-06-24 DIAGNOSIS — R609 Edema, unspecified: Secondary | ICD-10-CM | POA: Diagnosis not present

## 2016-06-26 ENCOUNTER — Other Ambulatory Visit: Payer: Self-pay | Admitting: Internal Medicine

## 2016-06-26 DIAGNOSIS — I82403 Acute embolism and thrombosis of unspecified deep veins of lower extremity, bilateral: Secondary | ICD-10-CM

## 2016-06-26 NOTE — Telephone Encounter (Signed)
Pt called needs Med refill for blood thinner .

## 2016-07-02 ENCOUNTER — Other Ambulatory Visit: Payer: Self-pay | Admitting: Internal Medicine

## 2016-07-04 ENCOUNTER — Telehealth: Payer: Self-pay

## 2016-07-04 NOTE — Telephone Encounter (Signed)
She needs to come in to have her vitamin D checked prior to refill of vitamin D.  Will refuse that one and refill prilosec.

## 2016-07-13 ENCOUNTER — Other Ambulatory Visit: Payer: Self-pay

## 2016-07-18 DIAGNOSIS — H04123 Dry eye syndrome of bilateral lacrimal glands: Secondary | ICD-10-CM | POA: Diagnosis not present

## 2016-07-18 DIAGNOSIS — H2513 Age-related nuclear cataract, bilateral: Secondary | ICD-10-CM | POA: Diagnosis not present

## 2016-07-19 ENCOUNTER — Other Ambulatory Visit: Payer: Self-pay | Admitting: Internal Medicine

## 2016-07-19 DIAGNOSIS — E559 Vitamin D deficiency, unspecified: Secondary | ICD-10-CM

## 2016-07-20 ENCOUNTER — Other Ambulatory Visit (INDEPENDENT_AMBULATORY_CARE_PROVIDER_SITE_OTHER): Payer: Medicare Other

## 2016-07-20 DIAGNOSIS — E559 Vitamin D deficiency, unspecified: Secondary | ICD-10-CM

## 2016-07-21 LAB — VITAMIN D 25 HYDROXY (VIT D DEFICIENCY, FRACTURES): Vit D, 25-Hydroxy: 38 ng/mL (ref 30.0–100.0)

## 2016-07-24 ENCOUNTER — Encounter: Payer: Self-pay | Admitting: Internal Medicine

## 2016-07-24 DIAGNOSIS — R609 Edema, unspecified: Secondary | ICD-10-CM | POA: Diagnosis not present

## 2016-07-26 DIAGNOSIS — M1990 Unspecified osteoarthritis, unspecified site: Secondary | ICD-10-CM | POA: Diagnosis not present

## 2016-07-26 DIAGNOSIS — J45901 Unspecified asthma with (acute) exacerbation: Secondary | ICD-10-CM | POA: Diagnosis not present

## 2016-07-26 DIAGNOSIS — R609 Edema, unspecified: Secondary | ICD-10-CM | POA: Diagnosis not present

## 2016-08-01 ENCOUNTER — Other Ambulatory Visit: Payer: Self-pay | Admitting: Internal Medicine

## 2016-08-01 ENCOUNTER — Ambulatory Visit (INDEPENDENT_AMBULATORY_CARE_PROVIDER_SITE_OTHER): Payer: Medicare Other | Admitting: Internal Medicine

## 2016-08-01 ENCOUNTER — Ambulatory Visit (INDEPENDENT_AMBULATORY_CARE_PROVIDER_SITE_OTHER): Payer: Medicare Other | Admitting: Dietician

## 2016-08-01 ENCOUNTER — Encounter: Payer: Self-pay | Admitting: Internal Medicine

## 2016-08-01 ENCOUNTER — Other Ambulatory Visit: Payer: Self-pay | Admitting: Dietician

## 2016-08-01 VITALS — BP 133/89 | HR 64 | Temp 97.8°F | Wt 254.0 lb

## 2016-08-01 DIAGNOSIS — Z7901 Long term (current) use of anticoagulants: Secondary | ICD-10-CM | POA: Diagnosis not present

## 2016-08-01 DIAGNOSIS — N183 Chronic kidney disease, stage 3 unspecified: Secondary | ICD-10-CM

## 2016-08-01 DIAGNOSIS — I10 Essential (primary) hypertension: Secondary | ICD-10-CM

## 2016-08-01 DIAGNOSIS — I129 Hypertensive chronic kidney disease with stage 1 through stage 4 chronic kidney disease, or unspecified chronic kidney disease: Secondary | ICD-10-CM

## 2016-08-01 DIAGNOSIS — M2550 Pain in unspecified joint: Secondary | ICD-10-CM

## 2016-08-01 DIAGNOSIS — J45909 Unspecified asthma, uncomplicated: Secondary | ICD-10-CM | POA: Diagnosis not present

## 2016-08-01 DIAGNOSIS — K219 Gastro-esophageal reflux disease without esophagitis: Secondary | ICD-10-CM

## 2016-08-01 DIAGNOSIS — Z713 Dietary counseling and surveillance: Secondary | ICD-10-CM

## 2016-08-01 DIAGNOSIS — D649 Anemia, unspecified: Secondary | ICD-10-CM | POA: Diagnosis not present

## 2016-08-01 DIAGNOSIS — I2510 Atherosclerotic heart disease of native coronary artery without angina pectoris: Secondary | ICD-10-CM

## 2016-08-01 DIAGNOSIS — G4762 Sleep related leg cramps: Secondary | ICD-10-CM | POA: Diagnosis not present

## 2016-08-01 DIAGNOSIS — I82403 Acute embolism and thrombosis of unspecified deep veins of lower extremity, bilateral: Secondary | ICD-10-CM

## 2016-08-01 DIAGNOSIS — G8929 Other chronic pain: Secondary | ICD-10-CM

## 2016-08-01 DIAGNOSIS — E785 Hyperlipidemia, unspecified: Secondary | ICD-10-CM

## 2016-08-01 MED ORDER — VERAPAMIL HCL ER 120 MG PO TBCR: 120.0000 mg | EXTENDED_RELEASE_TABLET | Freq: Every day | ORAL | 2 refills | Status: DC

## 2016-08-01 MED ORDER — OXYCODONE-ACETAMINOPHEN 10-325 MG PO TABS: 1.0000 | ORAL_TABLET | Freq: Three times a day (TID) | ORAL | 0 refills | Status: DC | PRN

## 2016-08-01 MED ORDER — NIFEDIPINE ER OSMOTIC RELEASE 30 MG PO TB24: 30.0000 mg | ORAL_TABLET | Freq: Every day | ORAL | 0 refills | Status: DC

## 2016-08-01 NOTE — Assessment & Plan Note (Signed)
Better with supplementation and replacement of vitamin D.    Continue to monitor.

## 2016-08-01 NOTE — Assessment & Plan Note (Signed)
She has worsening lower extremity edema, but she reports daily compliance with her xarelto.   Changes as noted above, continue xarelto.

## 2016-08-01 NOTE — Progress Notes (Signed)
  Medical Nutrition Therapy:  Appt start time: 1100 AM   end time:  1145 AM. Visit #1 ( last visit was visit 11 in a series september 2017)   Assessment:  Primary concerns today: weight loss and healthy eating  for kidney disease,anemia. [name] says she lost her support group, stopped going to [employer] and feels that she began to gain weight due to feeling despondent. She was slightly tearful today.  She reports eating fried foods, but no soda and still using whole grain bread. She still does not use salt. ATHROPOMETRICS: weight-254# increased from 214# however, she also has edema and is being started on lasix, BMI- obese class III SLEEP: not addressed today Activity: ADLS, sedentary- working on getting a walker to help her ge  DIETARY INTAKE: Usual eating pattern includes 2 meals and 1-2 snacks per day.   B: 2 slices whole wheat bread, 2 slices of fried bologna nd onioiin and watyer with what she thinks is sugar free kool aid  Estimated energy needs: 1500-1600 for gradual loss 180- 200g carbohydrates 78 grams protein/day    Progress Towards Goal(s):  Some progress.   Nutritional Diagnosis:  NB-1.1 Food and nutrition-related knowledge deficit As related to lack of sufficient prior meal planning training and education continues to improve As evidenced by her returning for additional assistance and support with lifestyle behavior change, goal setting and support.     Intervention:  Nutrition education, cousnleing and support using goal setting and motivational interviewing for increased activity and continued progress to making healthier low sodium more nutrient dense choices.  Teaching Method Utilized: Visual,,Auditory,Hands on Handouts given during visit include: AVS Barriers to learning/adherence to lifestyle change: competing values, support, unhealthy food environment Demonstrated degree of understanding via:  Teach Back   Monitoring/Evaluation:  Dietary intake, exercise, and body  weight in 4 week(s) Mouhamed Glassco, Lupita Leashonna, RD 08/01/2016 2:04 PM. .

## 2016-08-01 NOTE — Progress Notes (Signed)
Please enter referral for AMB Referral to [facility] for MNT for CKD.  Use the does not apply choice as appropriate. Thanks for including me in this patient's care!

## 2016-08-01 NOTE — Assessment & Plan Note (Signed)
Check BMET today 

## 2016-08-01 NOTE — Progress Notes (Signed)
Amlodipine discontinued - [facility] informed.

## 2016-08-01 NOTE — Patient Instructions (Addendum)
Vanessa Palmer - -   For your leg swelling, please do the following -  STOP amlodipine START Verapamil 120mg  daily  Take 2 tablets (40 mg) of your lasix for 3 days, then switch back to once per day.    Please come back to see me in 2 weeks for a blood pressure check.   Thank you!

## 2016-08-01 NOTE — Patient Instructions (Addendum)
Your goals:  1- GO TO [employer] NEXT TWO Tuesdays June 12, June 19 2- Try cooking onions in microwave for 1    minute

## 2016-08-01 NOTE — Assessment & Plan Note (Addendum)
BP is stable and well controlled today, unfortunately, she is having worsening lower extremity edema which seems to correlate with the increase in her amlodipine.    She has had some SOB with exertion, but not true dyspnea.  Based on her description she seems to have decreased exercise tolerance due to lack of exercise, pain and weight gain.  She has gained a significant amount of weight in the last 6 months.  She had an MPS this year which showed preserved ejection fraction.    Plan D/C amloidipine Start nifedipine 30mg  (still has LE swelling as an issue, but will use as a first try to help with the swelling given her renal failure. ) Lasix 40mg  daily for 3 days and then back to 20mg  daily.  (She reports only taking 1 tablet a day, but her actual Rx is for 2 tablets per day) Check BMET RTC in 2 weeks.  If swelling persists, will d/c nifedipine and start another agent.

## 2016-08-01 NOTE — Progress Notes (Signed)
Subjective:    Patient ID: Vanessa Palmer, female    DOB: 04-28-1950, 66 y.o.   MRN: [id number]  CC: 3 month follow up for HTN and chronic pain.   HPI   Vanessa Palmer is a 66yo woman with PMH of HTN, CAD, DVT on chronic xarelto, asthma, GERD, osteoarthritis and chronic pain, HLD, CKD who presents for routine follow up of these issues.    Today Vanessa Palmer reports that she is having worsening leg swelling.  She reported this to her Cardiologist Dr. Sharyn Lull who suggested that she limit her fluid intake.  She has been trying to do this, but not noticed much of a difference.  She does report that the swelling is better in the morning after resting and worse with activity.  She only props her legs up when they start to hurt.  She has had DVTs in the past, but she is taking her xarelto every morning per her.  She has associated SOB which she attributes to weight gain due to pain and not being able to exercise.  This has been slowly getting worse.  She has off an on issues with breathing at night including sometimes needing to use 3 pillows and sometimes lying on her side.  She has asthma but this does not bother her much.  She also has OSA, but does not use her sleep mask.  She had a nuc med stress test in January of this year which showed an EF of 66% which was in line with a previous TTE she had in 2013.  That study was also low risk.  She denies chest pain, PND.  This worsening of her symptoms does seem to coincide with the increased dose of her amlodipine from March.  She denies any changes to the skin of her legs or changes to sensation.    She also notes that she has chronic pain and wishes to get her pain under better control.  She is hoping to start exercising.  She has started taking a supplement with Zinc and Sulfur which has helped and she completed a course of Vitamin D which brought her level up to normal.    Her BP today was 133/89, however, we are going to change her BP medications today.        Review of Systems  Constitutional: Positive for activity change and fatigue. Negative for fever.  Respiratory: Positive for apnea and shortness of breath. Negative for cough, choking, chest tightness and wheezing.   Cardiovascular: Positive for leg swelling. Negative for chest pain and palpitations.  Genitourinary: Negative for difficulty urinating and dysuria.  Musculoskeletal: Positive for arthralgias, back pain and gait problem. Negative for joint swelling.  Skin: Negative for color change, rash and wound.  Neurological: Positive for weakness (generalized) and light-headedness. Negative for dizziness and headaches.       Objective:   Physical Exam  Constitutional: She is oriented to person, place, and time. She appears well-developed and well-nourished. No distress.  HENT:  Head: Normocephalic and atraumatic.  Eyes: Conjunctivae are normal. No scleral icterus.  Cardiovascular: Normal rate, regular rhythm and normal heart sounds.   No murmur heard. Pulmonary/Chest: Effort normal and breath sounds normal. No respiratory distress. She has no rales.  Musculoskeletal: She exhibits edema (pitting, to knees, highly sensitive to touch and jumps with any palpation, pulses palpable in DP bilaterally, swelling worse on the right. ).  Neurological: She is alert and oriented to person, place, and time. She exhibits normal  muscle tone.  Skin: Skin is warm and dry. No rash noted.  Psychiatric: She has a normal mood and affect. Her behavior is normal.   BMET today.     Assessment & Plan:  RTC in 2 weeks for BP and swelling check.

## 2016-08-02 LAB — BMP8+ANION GAP
ANION GAP: 12 mmol/L (ref 10.0–18.0)
BUN / CREAT RATIO: 10 — AB (ref 12–28)
BUN: 13 mg/dL (ref 8–27)
CHLORIDE: 101 mmol/L (ref 96–106)
CO2: 25 mmol/L (ref 18–29)
Calcium: 8.9 mg/dL (ref 8.7–10.3)
Creatinine, Ser: 1.24 mg/dL — ABNORMAL HIGH (ref 0.57–1.00)
GFR calc Af Amer: 53 mL/min/{1.73_m2} — ABNORMAL LOW (ref >59)
GFR calc non Af Amer: 46 mL/min/{1.73_m2} — ABNORMAL LOW (ref >59)
GLUCOSE: 102 mg/dL — AB (ref 65–99)
Potassium: 4.5 mmol/L (ref 3.5–5.2)
Sodium: 138 mmol/L (ref 134–144)

## 2016-08-06 NOTE — Progress Notes (Signed)
Signed. Thanks.

## 2016-08-08 ENCOUNTER — Other Ambulatory Visit: Payer: Self-pay

## 2016-08-08 MED ORDER — TRAZODONE HCL 50 MG PO TABS: 25.0000 mg | ORAL_TABLET | Freq: Every day | ORAL | 5 refills | Status: DC

## 2016-08-08 NOTE — Telephone Encounter (Signed)
traZODone (DESYREL) 50 MG tablet, refill request @ [facility].

## 2016-08-15 ENCOUNTER — Encounter: Payer: Self-pay | Admitting: Internal Medicine

## 2016-08-15 ENCOUNTER — Ambulatory Visit (INDEPENDENT_AMBULATORY_CARE_PROVIDER_SITE_OTHER): Payer: Medicare Other | Admitting: Dietician

## 2016-08-15 ENCOUNTER — Ambulatory Visit (INDEPENDENT_AMBULATORY_CARE_PROVIDER_SITE_OTHER): Payer: Medicare Other | Admitting: Internal Medicine

## 2016-08-15 VITALS — BP 136/83 | HR 60 | Temp 97.8°F | Wt 249.7 lb

## 2016-08-15 DIAGNOSIS — Z7901 Long term (current) use of anticoagulants: Secondary | ICD-10-CM

## 2016-08-15 DIAGNOSIS — Z6841 Body Mass Index (BMI) 40.0 and over, adult: Secondary | ICD-10-CM | POA: Diagnosis not present

## 2016-08-15 DIAGNOSIS — I10 Essential (primary) hypertension: Secondary | ICD-10-CM

## 2016-08-15 DIAGNOSIS — Z713 Dietary counseling and surveillance: Secondary | ICD-10-CM | POA: Diagnosis not present

## 2016-08-15 DIAGNOSIS — I2510 Atherosclerotic heart disease of native coronary artery without angina pectoris: Secondary | ICD-10-CM | POA: Diagnosis not present

## 2016-08-15 DIAGNOSIS — N183 Chronic kidney disease, stage 3 unspecified: Secondary | ICD-10-CM

## 2016-08-15 DIAGNOSIS — I82402 Acute embolism and thrombosis of unspecified deep veins of left lower extremity: Secondary | ICD-10-CM | POA: Diagnosis not present

## 2016-08-15 DIAGNOSIS — N189 Chronic kidney disease, unspecified: Secondary | ICD-10-CM

## 2016-08-15 DIAGNOSIS — I129 Hypertensive chronic kidney disease with stage 1 through stage 4 chronic kidney disease, or unspecified chronic kidney disease: Secondary | ICD-10-CM

## 2016-08-15 DIAGNOSIS — I517 Cardiomegaly: Secondary | ICD-10-CM | POA: Insufficient documentation

## 2016-08-15 DIAGNOSIS — Z79899 Other long term (current) drug therapy: Secondary | ICD-10-CM

## 2016-08-15 MED ORDER — FUROSEMIDE 40 MG PO TABS: 40.0000 mg | ORAL_TABLET | Freq: Every day | ORAL | 3 refills | Status: DC

## 2016-08-15 MED ORDER — VERAPAMIL HCL ER 120 MG PO TBCR: 120.0000 mg | EXTENDED_RELEASE_TABLET | Freq: Every day | ORAL | 3 refills | Status: DC

## 2016-08-15 NOTE — Patient Instructions (Addendum)
Vanessa Palmer - -   Thank you for coming in to see me today.   For your swelling - -  Please STOP nifedipine and metoprolol  Please START verapamil 120mg  daily.    Please schedule the Echo of your heart as soon as you can.    Verapamil sustained-release capsules What is this medicine? VERAPAMIL (ver AP a mil) is a calcium-channel blocker. It affects the amount of calcium found in your heart and muscle cells. This relaxes your blood vessels, which can reduce the amount of work the heart has to do. This medicine is used to lower high blood pressure. This medicine may be used for other purposes; ask your health care provider or pharmacist if you have questions. COMMON BRAND NAME(S): Verelan, Verelan PM What should I tell my health care provider before I take this medicine? They need to know if you have any of these conditions: -heart or blood vessel disease -heart rhythm disturbances such as sick sinus syndrome, ventricular arrhythmias, Wolff-Parkinson-White syndrome, or Lown-Ganong-Levine syndrome -liver or kidney disease -low blood pressure -an unusual or allergic reaction to verapamil, other medicines, foods, dyes, or preservatives -pregnant or trying to get pregnant -breast-feeding How should I use this medicine? Take this medicine by mouth with a glass of water. Follow the directions on the prescription label. The capsules may be opened and the medicine poured into a small amount of applesauce. Stir well and swallow without chewing. Take this medicine with food to reduce stomach upset. Take your doses at regular intervals. Do not take your medicine more often then directed. Do not stop taking except on the advice of your doctor or health care professional. Talk to your pediatrician regarding the use of this medicine in children. Special care may be needed. Overdosage: If you think you have taken too much of this medicine contact a poison control center or emergency room at once. NOTE:  This medicine is only for you. Do not share this medicine with others. What if I miss a dose? If you miss a dose, take it as soon as you can. If it is almost time for your next dose, take only that dose. Do not take double or extra doses. What may interact with this medicine? Do not take this medicine with any of the following: -cisapride -disopyramide -dofetilide -grapefruit juice -hawthorn -pimozide -red yeast rice This medicine may also interact with the following medications: -barbiturates such as phenobarbital -cimetidine -cyclosporine -lithium -local anesthetics or general anesthetics -medicines for heart rhythm problems like amiodarone, digoxin, flecainide, procainamide, quinidine -medicines for high blood pressure or heart problems -medicines for seizures like carbamazepine and phenytoin -rifampin, rifabutin or rifapentine -theophylline or aminophylline This list may not describe all possible interactions. Give your health care provider a list of all the medicines, herbs, non-prescription drugs, or dietary supplements you use. Also tell them if you smoke, drink alcohol, or use illegal drugs. Some items may interact with your medicine. What should I watch for while using this medicine? Check your blood pressure and pulse rate regularly. Ask your doctor or health care professional what your blood pressure and pulse rate should be and when you should contact him or her. Do not suddenly stop taking this medicine. Ask your doctor or health care professional how to gradually reduce the dose. You may get drowsy or dizzy. Do not drive, use machinery, or do anything that needs mental alertness until you know how this medicine affects you. Do not stand or sit up quickly, especially if  you are an older patient. This reduces the risk of dizzy or fainting spells. Alcohol may interfere with the effect of this medicine. Avoid alcoholic drinks. What side effects may I notice from receiving this  medicine? Side effects that you should report to your doctor or health care professional as soon as possible: -difficulty breathing -dizziness or light headedness -fainting -fast heartbeat, palpitations, irregular heartbeat, or chest pain -skin rash -slow heartbeat -swelling of the legs or ankles Side effects that usually do not require medical attention (report to your doctor or health care professional if they continue or are bothersome): -constipation -facial flushing -headache -nausea, vomiting -sexual dysfunction -weakness or tiredness This list may not describe all possible side effects. Call your doctor for medical advice about side effects. You may report side effects to FDA at 1-800-FDA-1088. Where should I keep my medicine? Keep out of the reach of children. Store at room temperature between 15 and 25 degrees C (59 and 77 degrees F). Protect from light and moisture. Keep container tightly closed. NOTE: This sheet is a summary. It may not cover all possible information. If you have questions about this medicine, talk to your doctor, pharmacist, or health care provider.  2018 Elsevier/Gold Standard (2007-11-10 17:47:58)

## 2016-08-15 NOTE — Progress Notes (Signed)
   Subjective:    Patient ID: Vanessa Palmer, female    DOB: [birth year], 66 y.o.   MRN: [id number]  CC: 2 week follow up for swelling and BP  HPI  Vanessa Palmer is a 66yo woman most recently seen by me for increased LE swelling after increasing her amlodipine dose.  At last visit, we stopped amlodipine and started nifedipine to see if this would help (though still has risk of swelling).  She presents today for follow up of this issue and BP.   Today, Vanessa Palmer reports that her swelling is about the same.  She has picked up the nifedipine, but does not feel that it is making anything better.  Her legs look subjectively better today, but she notes that she just got up from lying down.  She is on metoprolol for history of CAD.  She has no chest pain or issues today.  She had angioedema with ace inhibitor in the past.  She reports pain of the legs.  This is the same on both sides, worse with palpation, but on distraction very little pain and no calf pain.  Swelling is worse on the right.  She again reports only taking one lasix tablet a day, but I am not completely sure she knows what she is taking.    We discussed changes to her regimen to help with her swelling.    Review of Systems  Constitutional: Negative for appetite change and fatigue.  Respiratory: Negative for cough and shortness of breath.   Cardiovascular: Positive for leg swelling. Negative for chest pain and palpitations.  Neurological: Negative for syncope and light-headedness.  Psychiatric/Behavioral: Negative for confusion and decreased concentration.       Objective:   Physical Exam  Constitutional: She is oriented to person, place, and time. She appears well-developed. No distress.  HENT:  Head: Normocephalic and atraumatic.  Pulmonary/Chest: Effort normal. No respiratory distress.  Musculoskeletal: She exhibits edema (pitting on the right to mid calf, non pitting on the left.  ).  Neurological: She is alert and oriented  to person, place, and time.  Skin: Skin is dry. No rash noted.  Skin very sensitive to light touch, but no pain on palpation of calves with distraction.  Some minor skin changes, likely related to swelling.     BMET today.       Assessment & Plan:  RTC in 2 weeks.

## 2016-08-15 NOTE — Progress Notes (Signed)
  Medical Nutrition Therapy:  Appt start time: 1100 AM   end time:  1117 AM. Visit #2   Assessment:  Primary concerns today: weight loss and healthy eating  for kidney disease,anemia. [name] continues to have problems with edema. This kept her fronm gong to zumba because her legs hurt. She tried onions in the microwave and liked them. Diet recall has high salt items, is no added salt. She did not realize she should be limiting her fluid intake even water.  ATHROPOMETRICS: weight-249.7# , BMI- 45obese class III SLEEP: not addressed today Activity: ADLS, sedentary- working on getting a walker to help her ge  DIETARY INTAKE: Usual eating pattern includes 2 meals and 1-2 snacks per day.   B: fruit cocktail from a can  L: macaroni & cheese,  ribs and potato salad  Estimated energy needs: 1500-1600 for gradual loss 180- 200g carbohydrates 78 grams protein/day    Progress Towards Goal(s):  Some progress.   Nutritional Diagnosis:  NB-1.1 Food and nutrition-related knowledge deficit As related to lack of sufficient prior meal planning training and education continues to improve As evidenced by her returning for additional assistance and support with lifestyle behavior change, goal setting and support.     Intervention:  Nutrition education about fluid restriction.  Success and barrier to  Reaching goals Teaching Method Utilized: Visual,,Auditory,Hands on Handouts given during visit include: AVS Barriers to learning/adherence to lifestyle change: competing values, support, unhealthy food environment Demonstrated degree of understanding via:  Teach Back   Monitoring/Evaluation:  Dietary intake, exercise, and body weight in 2 week(s) Vanessa Palmer, Vanessa Palmer, RD 08/15/2016 1:57 PM. .

## 2016-08-15 NOTE — Patient Instructions (Signed)
Good Job remembering and meeting half your goals!   Your grand-daughter, Jenne CampusYesenia,  is adorable! Congrats on having her for family too!  Remember:  [employer]  See you soon.

## 2016-08-16 LAB — BMP8+ANION GAP
Anion Gap: 15 mmol/L (ref 10.0–18.0)
BUN / CREAT RATIO: 10 — AB (ref 12–28)
BUN: 11 mg/dL (ref 8–27)
CALCIUM: 9.1 mg/dL (ref 8.7–10.3)
CHLORIDE: 103 mmol/L (ref 96–106)
CO2: 21 mmol/L (ref 20–29)
CREATININE: 1.1 mg/dL — AB (ref 0.57–1.00)
GFR, EST AFRICAN AMERICAN: 62 mL/min/{1.73_m2} (ref >59)
GFR, EST NON AFRICAN AMERICAN: 54 mL/min/{1.73_m2} — AB (ref >59)
GLUCOSE: 103 mg/dL — AB (ref 65–99)
Potassium: 3.9 mmol/L (ref 3.5–5.2)
Sodium: 139 mmol/L (ref 134–144)

## 2016-08-16 NOTE — Assessment & Plan Note (Signed)
[name] continues to have LE swelling which appears improved on my exam, but she feels is still an issue.  She has pain out of proportion to exam, on distraction has no pain to palpation of the calves.  She reports faithful compliance with her xarelto.  I am not inclined to think this is related to a worsening DVT.  She did have symptoms start with increase in amlodipine.  She has chronic pain in her legs and sensitivity with light touch.  She did start nifedipine.  She is allergic to Ace inhibitors with angioedema as the allergy, so ARB would not be appropriate.  She is on metoprolol for BP and history of atherosclerosis with minimal disease noted in 2006.  She has not had issues with chest pain significantly since that time.  Given her need for BP control, and needing to stop amlodipine to improve swelling will do the following - -  Increase lasix to 40mg  daily (she has non proteinuric CKD and this may help with swelling) Ideally would start ACE-I, but given allergy, will start verapamil 120mg  daily STOP metoprolol to avoid two nodal blocking agents STOP nifedipine and amlodipine Check BMET today RTC in 2 weeks with BMET and BP evaluation.  Check TTE - last checked in 2013 showed mild LVH

## 2016-08-19 ENCOUNTER — Encounter (HOSPITAL_COMMUNITY): Payer: Self-pay | Admitting: Emergency Medicine

## 2016-08-19 ENCOUNTER — Emergency Department (HOSPITAL_COMMUNITY)
Admission: EM | Admit: 2016-08-19 | Discharge: 2016-08-19 | Disposition: A | Discharge status: Home / Self Care | Payer: Medicare Other | Attending: Emergency Medicine | Admitting: Emergency Medicine

## 2016-08-19 DIAGNOSIS — R52 Pain, unspecified: Secondary | ICD-10-CM | POA: Diagnosis not present

## 2016-08-19 DIAGNOSIS — I509 Heart failure, unspecified: Secondary | ICD-10-CM | POA: Insufficient documentation

## 2016-08-19 DIAGNOSIS — T783XXA Angioneurotic edema, initial encounter: Secondary | ICD-10-CM

## 2016-08-19 DIAGNOSIS — Z96641 Presence of right artificial hip joint: Secondary | ICD-10-CM | POA: Insufficient documentation

## 2016-08-19 DIAGNOSIS — I130 Hypertensive heart and chronic kidney disease with heart failure and stage 1 through stage 4 chronic kidney disease, or unspecified chronic kidney disease: Secondary | ICD-10-CM | POA: Insufficient documentation

## 2016-08-19 DIAGNOSIS — Z79899 Other long term (current) drug therapy: Secondary | ICD-10-CM | POA: Insufficient documentation

## 2016-08-19 DIAGNOSIS — M79661 Pain in right lower leg: Secondary | ICD-10-CM | POA: Diagnosis not present

## 2016-08-19 DIAGNOSIS — I2510 Atherosclerotic heart disease of native coronary artery without angina pectoris: Secondary | ICD-10-CM | POA: Diagnosis not present

## 2016-08-19 DIAGNOSIS — M79604 Pain in right leg: Secondary | ICD-10-CM

## 2016-08-19 DIAGNOSIS — Z7901 Long term (current) use of anticoagulants: Secondary | ICD-10-CM | POA: Diagnosis not present

## 2016-08-19 DIAGNOSIS — J45909 Unspecified asthma, uncomplicated: Secondary | ICD-10-CM | POA: Insufficient documentation

## 2016-08-19 DIAGNOSIS — N183 Chronic kidney disease, stage 3 (moderate): Secondary | ICD-10-CM | POA: Diagnosis not present

## 2016-08-19 MED ORDER — FENTANYL CITRATE (PF) 100 MCG/2ML IJ SOLN
50.0000 ug | Freq: Once | INTRAMUSCULAR | Status: AC
  Administered 2016-08-19: 50 ug via INTRAVENOUS
  Filled 2016-08-19: qty 2

## 2016-08-19 MED ORDER — PREDNISONE 20 MG PO TABS: ORAL_TABLET | ORAL | 0 refills | Status: DC

## 2016-08-19 MED ORDER — KETOROLAC TROMETHAMINE 30 MG/ML IJ SOLN
30.0000 mg | Freq: Once | INTRAMUSCULAR | Status: AC
  Administered 2016-08-19: 30 mg via INTRAVENOUS
  Filled 2016-08-19: qty 1

## 2016-08-19 MED ORDER — LORAZEPAM 2 MG/ML IJ SOLN
1.0000 mg | Freq: Once | INTRAMUSCULAR | Status: AC
Start: 2016-08-19 — End: 2016-08-19
  Administered 2016-08-19: 1 mg via INTRAVENOUS
  Filled 2016-08-19: qty 1

## 2016-08-19 MED ORDER — METHYLPREDNISOLONE SODIUM SUCC 125 MG IJ SOLR
125.0000 mg | Freq: Once | INTRAMUSCULAR | Status: AC
  Administered 2016-08-19: 125 mg via INTRAVENOUS
  Filled 2016-08-19: qty 2

## 2016-08-19 NOTE — ED Provider Notes (Signed)
[facility]-EMERGENCY DEPT Provider Note   CSN: [id number] Arrival date & time: 08/19/16  1857     History   Chief Complaint Chief Complaint  Patient presents with  . Allergic Reaction  . Leg Pain    HPI Vanessa Palmer is a 66 y.o. female.  Patient is a 66 year old female with a history of chronic kidney disease, coronary artery disease, DVT on Xarelto, hypertension and chronic pain who presents with right leg pain. She states is been hurting for about the last month. It seems to be getting worse. She denies any swelling to the leg other than she's had some mild swelling in both of her lower extremities over the last several weeks. She states it's diffuse all up and down the leg. She doesn't describe it being located to any particular joint. She denies any back pain. No numbness or weakness in the leg. She denies any injury to the leg. She was seen by her PCP recently and had some mild lower extremity swelling. She was switched from amlodipine to verapamil which seemed to have improved the swelling per her PCP. She also notes that she started taking the verapamil 3 days ago. This morning she woke up and noticed that her lower lip was swollen. She took some Benadryl and has noted improvement but still feels a little swollen. She denies any tongue swelling. No shortness of breath. No wheezing. No rash. She is compliant with her Xarelto.      Past Medical History:  Diagnosis Date  . ALCOHOL ABUSE 12/13/2005   Annotation: Sober since 11/06 Qualifier: Diagnosis of  By: Wallace Cullens MD, Natalia Leatherwood    . Anemia   . CAD (coronary artery disease)    s/p non-Q wave MI in 06/2001 and 2004, 2005  . CHF (congestive heart failure) (HCC)   . Chronic kidney disease (CKD), stage III (moderate) 09/19/2010  . Depression   . DJD (degenerative joint disease)   . DVT of lower extremity, bilateral (HCC) 04/04/2012   On Xarelto    . GERD (gastroesophageal reflux disease)   . Gout   . Headache    migraines  .  Hyperlipidemia   . Hypertension   . INTRINSIC ASTHMA, WITH EXACERBATION 09/27/2009   Qualifier: Diagnosis of  By: Denton Meek MD, Tillie Rung    . Obesity   . OSA (obstructive sleep apnea)    previously used CPAP, has lost  . Seizures (HCC)    As a teenager     Patient Active Problem List   Diagnosis Date Noted  . LVH (left ventricular hypertrophy) 08/15/2016  . Flatulence 05/01/2016  . Anemia 08/18/2015  . Status post total replacement of right hip 11/23/2014  . Osteoarthritis of right hip 10/13/2014  . Nocturnal leg cramps 01/06/2014  . DVT of lower extremity, bilateral (HCC) 04/04/2012  . Routine health maintenance 01/14/2012  . GERD (gastroesophageal reflux disease) 01/09/2012  . Urinary incontinence 12/31/2011  . Chronic kidney disease (CKD), stage III (moderate) 09/19/2010  . Chronic pelvic pain in female 02/03/2010  . Asthma, chronic 09/27/2009  . Elevated alkaline phosphatase level 08/01/2009  . Pain in joint, multiple sites 12/21/2008  . MAJOR DPRSV DISORDER RECURRENT EPISODE MODERATE 09/08/2008  . Gout 07/05/2008  . Hyperlipidemia 05/07/2006  . Coronary atherosclerosis 02/05/2006  . Morbid obesity (HCC) 12/13/2005  . History of alcohol abuse 12/13/2005  . SLEEP APNEA, OBSTRUCTIVE 12/13/2005  . Essential hypertension 12/13/2005    Past Surgical History:  Procedure Laterality Date  . CARDIAC CATHETERIZATION  06/2001, 02/2002, 10/2004 (10-15% proximal stenosis of circumflex, diffuse disease of  OM1 and RCA)  . COLONOSCOPY    . KNEE ARTHROSCOPY Left   . PARTIAL HYSTERECTOMY     due to endometriosis  . TOTAL HIP ARTHROPLASTY Right 11/23/2014   Procedure: RIGHT TOTAL HIP ARTHROPLASTY ANTERIOR APPROACH;  Surgeon: Kathryne Hitchhristopher Y Blackman, MD;  [facility];  Service: Orthopedics;  Laterality: Right;  . TUBAL LIGATION      OB History    No data available       Home Medications    Prior to Admission medications   Medication Sig Start Date End Date Taking?  Authorizing Provider  acetaminophen (TYLENOL) 500 MG tablet Take 1,000 mg by mouth every 6 (six) hours as needed for moderate pain.   Yes [provider]  atorvastatin (LIPITOR) 40 MG tablet TAKE 1 TABLET (40 MG TOTAL) BY MOUTH DAILY. 04/10/16  Yes [name], [name], MD  fluticasone (FLONASE) 50 MCG/ACT nasal spray INSTILL TWO   SPRAYS IN EACH NOSTRIL ONCE DAILY Patient taking differently: INSTILL TWO   SPRAYS IN EACH NOSTRIL ONCE DAILY AS NEEDED FOR ALLERGIES 11/22/15  Yes [name], [name], MD  fluticasone (FLOVENT HFA) 44 MCG/ACT inhaler Inhale 2 puffs into the lungs 2 (two) times daily. Patient taking differently: Inhale 2 puffs into the lungs 2 (two) times daily as needed (shortness of breath).  06/02/13  Yes McLean-Scocuzza, Pasty Spillersracy N, MD  furosemide (LASIX) 40 MG tablet Take 1 tablet (40 mg total) by mouth daily. 08/15/16  Yes [name], [name], MD  nitroGLYCERIN (NITROSTAT) 0.4 MG SL tablet Place 0.4 mg under the tongue every 5 (five) minutes as needed for chest pain.   Yes [provider]  omeprazole (PRILOSEC) 40 MG capsule TAKE 1 CAPSULE (40 MG TOTAL) BY MOUTH DAILY. 07/04/16  Yes [name], [name], MD  oxybutynin (DITROPAN) 5 MG tablet Take 1 tablet (5 mg total) by mouth 2 (two) times daily. 11/23/15  Yes [name], [name], MD  oxyCODONE-acetaminophen (PERCOCET) 10-325 MG tablet Take 1 tablet by mouth every 8 (eight) hours as needed for pain. 08/01/16 07/09/17 Yes [name], [name], MD  traZODone (DESYREL) 50 MG tablet Take 0.5 tablets (25 mg total) by mouth at bedtime. 08/08/16  Yes [name], [name], MD  verapamil (CALAN-SR) 120 MG CR tablet Take 1 tablet (120 mg total) by mouth at bedtime. 08/15/16  Yes [name], [name], MD  XARELTO 20 MG TABS tablet TAKE 1 TABLET (20 MG TOTAL) BY MOUTH DAILY WITH SUPPER. 06/29/16  Yes [name], [name], MD  predniSONE (DELTASONE) 20 MG tablet 2 tabs po daily x 4 days 08/19/16   Rolan BuccoBelfi, Calleigh Lafontant, MD    Family History Family History  Problem Relation Age of Onset  .  Mental illness Mother   . Heart disease Father   . Diabetes Sister   . Diabetes Sister   . Diabetes Sister     Social History Social History  Substance Use Topics  . Smoking status: Never Smoker  . Smokeless tobacco: Never Used  . Alcohol use 1.8 oz/week    3 Shots of liquor per week     Comment: Sometimes.     Allergies   Ace inhibitors; Lexiscan [regadenoson]; Peanuts [peanut oil]; Hydromorphone hcl; Percocet [oxycodone-acetaminophen]; Propoxyphene n-acetaminophen; and Vicodin [hydrocodone-acetaminophen]   Review of Systems Review of Systems  Constitutional: Negative for chills, diaphoresis, fatigue and fever.  HENT: Positive for facial swelling. Negative for congestion, rhinorrhea and sneezing.   Eyes: Negative.  Respiratory: Negative for cough, chest tightness and shortness of breath.   Cardiovascular: Negative for chest pain and leg swelling.  Gastrointestinal: Negative for abdominal pain, blood in stool, diarrhea, nausea and vomiting.  Genitourinary: Negative for difficulty urinating, flank pain, frequency and hematuria.  Musculoskeletal: Positive for arthralgias and myalgias. Negative for back pain.  Skin: Negative for rash.  Neurological: Negative for dizziness, speech difficulty, weakness, numbness and headaches.     Physical Exam Updated Vital Signs BP (!) 159/88   Pulse 65   Temp 98.9 F (37.2 C) (Oral)   Resp 16   SpO2 100%   Physical Exam  Constitutional: She is oriented to person, place, and time. She appears well-developed and well-nourished.  HENT:  Head: Normocephalic and atraumatic.  Mild swelling to the right aspect of the lower lip, is nontender on exam. No tongue swelling or other angioedema noted, no trismus  Eyes: Pupils are equal, round, and reactive to light.  Neck: Normal range of motion. Neck supple.  Cardiovascular: Normal rate, regular rhythm and normal heart sounds.   Pulmonary/Chest: Effort normal and breath sounds normal. No  respiratory distress. She has no wheezes. She has no rales. She exhibits no tenderness.  Abdominal: Soft. Bowel sounds are normal. There is no tenderness. There is no rebound and no guarding.  Musculoskeletal: Normal range of motion. She exhibits no edema.  Patient has no notable edema on exam. Pedal pulses are intact. She has diffuse swelling anywhere you touch the right lower leg, she is writhing around the bed and crying out that her leg is cramping. She has normal sensation and motor function leg. Pedal pulses are intact. There is no discoloration of the leg  Lymphadenopathy:    She has no cervical adenopathy.  Neurological: She is alert and oriented to person, place, and time.  Skin: Skin is warm and dry. No rash noted.  Psychiatric: She has a normal mood and affect.     ED Treatments / Results  Labs (all labs ordered are listed, but only abnormal results are displayed) Labs Reviewed - No data to display  EKG  EKG Interpretation None       Radiology No results found.  Procedures Procedures (including critical care time)  Medications Ordered in ED Medications  fentaNYL (SUBLIMAZE) injection 50 mcg (50 mcg Intravenous Given 08/19/16 2049)  methylPREDNISolone sodium succinate (SOLU-MEDROL) 125 mg/2 mL injection 125 mg (125 mg Intravenous Given 08/19/16 2050)  LORazepam (ATIVAN) injection 1 mg (1 mg Intravenous Given 08/19/16 2203)  ketorolac (TORADOL) 30 MG/ML injection 30 mg (30 mg Intravenous Given 08/19/16 2236)     Initial Impression / Assessment and Plan / ED Course  I have reviewed the triage vital signs and the nursing notes.  Pertinent labs & imaging results that were available during my care of the patient were reviewed by me and considered in my medical decision making (see chart for details).     Patient presents with 2 complaints. The first is that she had a little bit of angioedema to her lower lip. Questioning this is from verapamil that she recently started.  Her symptoms are very mild on arrival and improved after dose of Solu-Medrol. She currently has no symptoms. I will start her on a four-day course of prednisone to start tomorrow. I advised her to call her doctor tomorrow and see what she wants to do about continuing the verapamil. She was advised to return if she has any worsening angioedema.  The second issue is worsening pain  in her right lower extremity. This is been going on for a while but has been getting worse. There is minimal swelling to the leg. She has good pedal pulses. There is no signs of poor perfusion. There is no bony tenderness. She seems to be having muscle spasms. She improved after getting pain medication and Ativan. I have a low suspicion for DVT. It is no unilateral swelling and she is on Xarelto. However given the mild swelling with worsening pain, I did order an outpatient Doppler study to be done tomorrow. I explained this to the patient. She is on chronic oxycodone which is prescribed by her PCP. I encouraged her to continue taking this for pain and follow-up with her primary care provider.  Final Clinical Impressions(s) / ED Diagnoses   Final diagnoses:  Angioedema, initial encounter  Pain of right lower extremity    New Prescriptions New Prescriptions   PREDNISONE (DELTASONE) 20 MG TABLET    2 tabs po daily x 4 days     Rolan Bucco, MD 08/19/16 2336

## 2016-08-19 NOTE — ED Notes (Signed)
Pt stable, ambulatory, states understanding of discharge instructions 

## 2016-08-19 NOTE — ED Triage Notes (Signed)
Pt presents to ed for assessment after starting a new medicine Thursday night (Veratmil?), and states she had swelling to right side of face and lip.  Pt took 2 otc benadryl prior to calling ems.  Pt has also hx of chronic pain to her right leg, pt states worse today, moaning in room at this time.  C/o spasms.

## 2016-08-19 NOTE — ED Notes (Signed)
Pt called out to use the bathroom. Offered Pt the bedpan b/c of her leg pain. Pt refused bedpan multiple times. Pt stated she wanted to use the bedside. Assisted Pt to bedside. Pt crying the whole time while getting up and while on bedside and getting back in the bed.

## 2016-08-20 ENCOUNTER — Telehealth: Payer: Self-pay

## 2016-08-20 ENCOUNTER — Ambulatory Visit (HOSPITAL_COMMUNITY): Payer: Medicare Other

## 2016-08-20 NOTE — Telephone Encounter (Signed)
Questions about meds. Please call back.  

## 2016-08-20 NOTE — Telephone Encounter (Signed)
Pt states she thinks she is allergic to the recently prescribed verapamil, went to ED this weekend, was given prednisone and has not taken verapamil since, she is offered a f/u appt and states she does not have any money right now, does she need to be seen or can you prescribe a replacement, please advise

## 2016-08-21 ENCOUNTER — Other Ambulatory Visit: Payer: Self-pay | Admitting: Internal Medicine

## 2016-08-21 MED ORDER — HYDRALAZINE HCL 10 MG PO TABS: 10.0000 mg | ORAL_TABLET | Freq: Two times a day (BID) | ORAL | 0 refills | Status: DC

## 2016-08-21 NOTE — Progress Notes (Signed)
Patient seen in ED and reports verapamil reaction.  Will try hydralazine BID and see her back to the clinic as previously scheduled.   Start hydralazine 10mg  BID.

## 2016-08-21 NOTE — Telephone Encounter (Signed)
Will try an Rx for Hydralazine.  Will send to her pharmacy of record.  She should follow up as previously planned.  Call with any issues.    Thanks

## 2016-08-22 ENCOUNTER — Other Ambulatory Visit (HOSPITAL_COMMUNITY): Payer: Self-pay

## 2016-08-22 NOTE — Telephone Encounter (Signed)
Tried to reach pt 6/26, someone answered and radio or tv played for a min or 2 getting louder and louder??? Disconnected the call

## 2016-08-24 ENCOUNTER — Ambulatory Visit (INDEPENDENT_AMBULATORY_CARE_PROVIDER_SITE_OTHER): Payer: Medicare Other | Admitting: Internal Medicine

## 2016-08-24 DIAGNOSIS — Z7901 Long term (current) use of anticoagulants: Secondary | ICD-10-CM

## 2016-08-24 DIAGNOSIS — Z79899 Other long term (current) drug therapy: Secondary | ICD-10-CM

## 2016-08-24 DIAGNOSIS — Z8249 Family history of ischemic heart disease and other diseases of the circulatory system: Secondary | ICD-10-CM

## 2016-08-24 DIAGNOSIS — I10 Essential (primary) hypertension: Secondary | ICD-10-CM | POA: Diagnosis not present

## 2016-08-24 DIAGNOSIS — I82403 Acute embolism and thrombosis of unspecified deep veins of lower extremity, bilateral: Secondary | ICD-10-CM | POA: Diagnosis not present

## 2016-08-24 DIAGNOSIS — R609 Edema, unspecified: Secondary | ICD-10-CM | POA: Diagnosis not present

## 2016-08-24 MED ORDER — HYDRALAZINE HCL 25 MG PO TABS: 25.0000 mg | ORAL_TABLET | Freq: Two times a day (BID) | ORAL | 2 refills | Status: DC

## 2016-08-24 NOTE — Progress Notes (Signed)
   CC: For ED follow-up-she was seen for lower lip swelling after starting verapamil.  HPI:  Vanessa Palmer is a 66 y.o. with past medical history as listed below came to the clinic for ED follow-up, she went to ED on 08/19/2016 after waking up with lower lip swelling, appears more like angioedema, there was no swelling of tongue and no difficulty with breathing. She was recently started on verapamil 3 days ago. The past she has been allergic to lisinopril, unable to tolerate amlodipine which was causing lower extremity edema. In ED she was given Solu-Medrol and was discharged home on prednisone for 4 days, she was advised to discontinue verapamil and follow-up with PCP.  Patient was saying that her right leg pain is also improved with prednisone.  Past Medical History:  Diagnosis Date  . ALCOHOL ABUSE 12/13/2005   Annotation: Sober since 11/06 Qualifier: Diagnosis of  By: Wallace CullensGray MD, Natalia LeatherwoodKatherine    . Anemia   . CAD (coronary artery disease)    s/p non-Q wave MI in 06/2001 and 2004, 2005  . CHF (congestive heart failure) (HCC)   . Chronic kidney disease (CKD), stage III (moderate) 09/19/2010  . Depression   . DJD (degenerative joint disease)   . DVT of lower extremity, bilateral (HCC) 04/04/2012   On Xarelto    . GERD (gastroesophageal reflux disease)   . Gout   . Headache    migraines  . Hyperlipidemia   . Hypertension   . INTRINSIC ASTHMA, WITH EXACERBATION 09/27/2009   Qualifier: Diagnosis of  By: Denton MeekKarimova MD, Tillie RungNodira    . Obesity   . OSA (obstructive sleep apnea)    previously used CPAP, has lost  . Seizures (HCC)    As a teenager    Review of Systems:  As per HPI.  Physical Exam:  Vitals:   08/24/16 1400  BP: (!) 158/117  Pulse: 69  Temp: 98.5 F (36.9 C)  SpO2: 100%  Weight: 243 lb 8 oz (110.5 kg)    General: Vital signs reviewed.  Patient is well-developed and well-nourished, in no acute distress and cooperative with exam.  Cardiovascular: RRR, S1 normal, S2  normal, no murmurs, gallops, or rubs. Pulmonary/Chest: Clear to auscultation bilaterally, no wheezes, rales, or rhonchi. Abdominal: Soft, non-tender, non-distended, BS +, no masses, organomegaly, or guarding present.  Extremities: No lower extremity edema bilaterally,  pulses symmetric and intact bilaterally. Sensitive to touch. Skin: Warm, dry and intact. No rashes or erythema. Psychiatric: Normal mood and affect. speech and behavior is normal. Cognition and memory are normal.  Assessment & Plan:   See Encounters Tab for problem based charting.  Patient discussed with Dr. Criselda PeachesMullen.

## 2016-08-24 NOTE — Assessment & Plan Note (Addendum)
BP Readings from Last 3 Encounters:  08/24/16 (!) 147/79  08/19/16 (!) 155/64  08/15/16 136/83   Her blood pressure was mildly elevated.  As she is unable to tolerate verapamil and had an allergic reaction, we will discontinue verapamil. -Increase hydralazine to 25 mg twice a day. -Continue current dose of Lasix 40 mg daily -Follow up with PCP on 09/05/2016.

## 2016-08-24 NOTE — Assessment & Plan Note (Signed)
In ED she was also complaining of right lower extremity pain. There was low suspicion of DVT as there was no calf tenderness or swelling. Patient is on Xarelto.  They ordered an outpatient Doppler.  I don't thinks she need any DVT workup. According to patient her leg pain has been improved since she was taking prednisone.  -Follow up with PCP.

## 2016-08-24 NOTE — Patient Instructions (Signed)
Thank you for visiting clinic today. I am increasing the dose of hydralazine, now you will take 25 mg  twice daily instead of 10 mg. Stop taking verapamil . Please follow-up with your PCP on 09/05/2016 according to your previous scheduled appointment .

## 2016-08-27 NOTE — Progress Notes (Signed)
Internal Medicine Clinic Attending  Case discussed with Dr. Amin at the time of the visit.  We reviewed the resident's history and exam and pertinent patient test results.  I agree with the assessment, diagnosis, and plan of care documented in the resident's note.    

## 2016-08-28 ENCOUNTER — Encounter: Payer: Self-pay | Admitting: Pharmacist

## 2016-08-28 NOTE — Progress Notes (Signed)
Vanessa Palmer is a 66 y.o. female who was reviewed for monitoring of rivaroxaban (Xarelto) therapy.    ASSESSMENT Indication(s): DVT history, bilateral Duration: indefinite  Labs: Component Value Date/Time   AST 20 05/03/2016 2117   ALT 11 (L) 05/03/2016 2117   NA 139 08/15/2016 1049   K 3.9 08/15/2016 1049   CL 103 08/15/2016 1049   CO2 21 08/15/2016 1049   GLUCOSE 103 (H) 08/15/2016 1049   GLUCOSE 111 (H) 05/03/2016 2117   HGBA1C 5.5 12/16/2014 1653   HGBA1C 5.8 (H) 12/31/2011 0206   BUN 11 08/15/2016 1049   CREATININE 1.10 (H) 08/15/2016 1049   CREATININE 1.66 (H) 08/11/2014 1200   CALCIUM 9.1 08/15/2016 1049   GFRAA 62 08/15/2016 1049   GFRAA 38 (L) 08/11/2014 1200   WBC 5.7 05/03/2016 2117   HGB 12.4 05/03/2016 2117   HGB 11.5 02/16/2016 1116   HCT 35.9 (L) 05/03/2016 2117   HCT 33.4 (L) 02/16/2016 1116   PLT 215 05/03/2016 2117   PLT 195 02/16/2016 1116    rivaroxaban (Xarelto) Dose: 20 mg daily  No concerns at this time, pharmacy records indicate consistent refills  Marzetta BoardJennifer Jackilyn Umphlett Clinical Pharmacist  08/28/2016, 8:27 PM

## 2016-08-31 ENCOUNTER — Other Ambulatory Visit: Payer: Self-pay

## 2016-08-31 ENCOUNTER — Ambulatory Visit (HOSPITAL_COMMUNITY): Payer: Medicare Other | Attending: Cardiovascular Disease

## 2016-08-31 DIAGNOSIS — I509 Heart failure, unspecified: Secondary | ICD-10-CM | POA: Insufficient documentation

## 2016-08-31 DIAGNOSIS — Z6841 Body Mass Index (BMI) 40.0 and over, adult: Secondary | ICD-10-CM | POA: Insufficient documentation

## 2016-08-31 DIAGNOSIS — I77810 Thoracic aortic ectasia: Secondary | ICD-10-CM | POA: Diagnosis not present

## 2016-08-31 DIAGNOSIS — I2510 Atherosclerotic heart disease of native coronary artery without angina pectoris: Secondary | ICD-10-CM | POA: Insufficient documentation

## 2016-08-31 DIAGNOSIS — I517 Cardiomegaly: Secondary | ICD-10-CM

## 2016-08-31 DIAGNOSIS — N183 Chronic kidney disease, stage 3 unspecified: Secondary | ICD-10-CM

## 2016-08-31 DIAGNOSIS — E785 Hyperlipidemia, unspecified: Secondary | ICD-10-CM | POA: Diagnosis not present

## 2016-08-31 DIAGNOSIS — I351 Nonrheumatic aortic (valve) insufficiency: Secondary | ICD-10-CM | POA: Insufficient documentation

## 2016-08-31 DIAGNOSIS — G4733 Obstructive sleep apnea (adult) (pediatric): Secondary | ICD-10-CM | POA: Diagnosis not present

## 2016-08-31 DIAGNOSIS — I130 Hypertensive heart and chronic kidney disease with heart failure and stage 1 through stage 4 chronic kidney disease, or unspecified chronic kidney disease: Secondary | ICD-10-CM | POA: Diagnosis not present

## 2016-09-05 ENCOUNTER — Ambulatory Visit (INDEPENDENT_AMBULATORY_CARE_PROVIDER_SITE_OTHER): Payer: Medicare Other | Admitting: Internal Medicine

## 2016-09-05 ENCOUNTER — Encounter: Payer: Self-pay | Admitting: Internal Medicine

## 2016-09-05 ENCOUNTER — Ambulatory Visit (INDEPENDENT_AMBULATORY_CARE_PROVIDER_SITE_OTHER): Payer: Medicare Other | Admitting: Dietician

## 2016-09-05 DIAGNOSIS — I2510 Atherosclerotic heart disease of native coronary artery without angina pectoris: Secondary | ICD-10-CM

## 2016-09-05 DIAGNOSIS — G4733 Obstructive sleep apnea (adult) (pediatric): Secondary | ICD-10-CM

## 2016-09-05 DIAGNOSIS — K219 Gastro-esophageal reflux disease without esophagitis: Secondary | ICD-10-CM | POA: Diagnosis not present

## 2016-09-05 DIAGNOSIS — M1A9XX0 Chronic gout, unspecified, without tophus (tophi): Secondary | ICD-10-CM

## 2016-09-05 DIAGNOSIS — Z6841 Body Mass Index (BMI) 40.0 and over, adult: Secondary | ICD-10-CM

## 2016-09-05 DIAGNOSIS — I10 Essential (primary) hypertension: Secondary | ICD-10-CM

## 2016-09-05 DIAGNOSIS — Z713 Dietary counseling and surveillance: Secondary | ICD-10-CM | POA: Diagnosis not present

## 2016-09-05 DIAGNOSIS — D649 Anemia, unspecified: Secondary | ICD-10-CM | POA: Diagnosis not present

## 2016-09-05 DIAGNOSIS — I129 Hypertensive chronic kidney disease with stage 1 through stage 4 chronic kidney disease, or unspecified chronic kidney disease: Secondary | ICD-10-CM

## 2016-09-05 DIAGNOSIS — N183 Chronic kidney disease, stage 3 unspecified: Secondary | ICD-10-CM

## 2016-09-05 DIAGNOSIS — Z7901 Long term (current) use of anticoagulants: Secondary | ICD-10-CM

## 2016-09-05 DIAGNOSIS — Z0000 Encounter for general adult medical examination without abnormal findings: Secondary | ICD-10-CM

## 2016-09-05 DIAGNOSIS — I82403 Acute embolism and thrombosis of unspecified deep veins of lower extremity, bilateral: Secondary | ICD-10-CM

## 2016-09-05 DIAGNOSIS — M109 Gout, unspecified: Secondary | ICD-10-CM

## 2016-09-05 DIAGNOSIS — Z7951 Long term (current) use of inhaled steroids: Secondary | ICD-10-CM

## 2016-09-05 DIAGNOSIS — N189 Chronic kidney disease, unspecified: Secondary | ICD-10-CM | POA: Diagnosis not present

## 2016-09-05 DIAGNOSIS — J4520 Mild intermittent asthma, uncomplicated: Secondary | ICD-10-CM

## 2016-09-05 DIAGNOSIS — I252 Old myocardial infarction: Secondary | ICD-10-CM | POA: Diagnosis not present

## 2016-09-05 DIAGNOSIS — Z79899 Other long term (current) drug therapy: Secondary | ICD-10-CM

## 2016-09-05 DIAGNOSIS — J45909 Unspecified asthma, uncomplicated: Secondary | ICD-10-CM

## 2016-09-05 DIAGNOSIS — D631 Anemia in chronic kidney disease: Secondary | ICD-10-CM

## 2016-09-05 DIAGNOSIS — Z90710 Acquired absence of both cervix and uterus: Secondary | ICD-10-CM

## 2016-09-05 NOTE — Assessment & Plan Note (Signed)
She has not been wearing a sleep mask for a long while, she could not tolerate it.  She reports no daytime somnolence or headaches today.   Continue to monitor.  Repeat sleep study in future if symptoms change or worsen.

## 2016-09-05 NOTE — Assessment & Plan Note (Signed)
She has not had a recurrent episode in a while per her.  She is not on any chronic controlling medications.  We discussed her risk of a gout attack and dietary changes she could make to decrease her risk.   Continue to monitor Add allopurinol in future if she has > 3 attacks per year.

## 2016-09-05 NOTE — Assessment & Plan Note (Signed)
Last CBC showed normal Hgb and mildly low Hct.  She may develop anemia related to her CKD, but currently no signs or symptoms.  Will resolve issue.

## 2016-09-05 NOTE — Assessment & Plan Note (Signed)
Seems to be acting up now that she has a mild URI.  She is taking albuterol with good results.  She had no wheezing on exam today.   Continue Flovent INH, albuterol PRN

## 2016-09-05 NOTE — Progress Notes (Signed)
  Medical Nutrition Therapy:  Appt start time: 1000 AM   end time:  1020 AM. Visit #3   Assessment:  Primary concerns today: weight loss and healthy eating  for kidney disease,anemia. [name] is here with her great grand daughter. She says her edema is better. She is measuring her liquids as discussed at our last visit. She continuestried onions in the microwave and liked them. Diet recall has high salt items, more fruits and vegetables.  ATHROPOMETRICS: weight-243.5# , decreased 6# which could be fluid. BMI- 44 obese class III SLEEP: not addressed today Activity: ADLS, sedentary-in a motorized wheelchair today, she did not mention a walker to help her get around  DIETARY INTAKE: Usual eating pattern includes 2 meals and 1-2 snacks per day.   B: fruit of some kind, sometimes egg  L: salad, vegetables only D: vegetables, beans, salmon, chicken  Estimated energy needs: 1500-1600 for gradual loss 180- 200g carbohydrates 78 grams protein/day   Progress Towards Goal(s):  Some progress.   Nutritional Diagnosis:  NB-1.1 Food and nutrition-related knowledge deficit As related to lack of sufficient prior meal planning training and education continues to improve As evidenced by her returning for additional assistance and support with lifestyle behavior change, goal setting and support.     Intervention:  Nutrition education about fluid restriction.  Success and barrier to  Reaching goals Teaching Method Utilized: Visual,,Auditory,Hands on Handouts given during visit include: AVS Barriers to learning/adherence to lifestyle change: competing values, support, unhealthy food environment Demonstrated degree of understanding via:  Teach Back   Monitoring/Evaluation:  Dietary intake, exercise, and body weight in 4 week(s) Plyler, Lupita LeashDonna, RD 09/05/2016 4:40 PM. .

## 2016-09-05 NOTE — Progress Notes (Signed)
   Subjective:    Patient ID: Vanessa Palmer, female    DOB: 1950/08/05, 66 y.o.   MRN: [id number]  CC: 2 week follow up for elevated BP  HPI  Vanessa Palmer is a 66yo woman with PMH of HTN, Leg Swelling, CAD, gout, CKD, urinary incontinence who presents for follow up of high blood pressure.    This issue has been going on since early June.  Vanessa Palmer presented at that time for worsening leg swelling and pain in the legs.  There was no obvious source, so her amlodipine was stopped in order to see if this could be the culprit.  Over the last month, she has been tried on multiple medications and unfortunately had an allergic reaction to verapamil.  She is now on Hydralazine and has had the medication titrated to control her BP.  She is now off of amlodipine, verapamil and metoprolol.  She is also taking lasix for CKD with edema.  She had a TTE which showed only mild LVH and mild AI.  She notes no chest pain.  Leg swelling is back to normal and leg pain has resolved.   She reports today about 3 days of cold like symptoms.  Mainly cough, runny nose, cough with small amount of sputum production, chills, and mild SOB.  She has not had any severe SOB, chest pain, pleuritic chest pain, objective fevers.  She is taking tylenol with good results for the chills.    We further discussed some of her other chronic issues, see assessment and plan.   Review of Systems  Constitutional: Positive for chills. Negative for fever.  HENT: Positive for congestion, rhinorrhea and sore throat.   Eyes: Negative for photophobia and visual disturbance.  Respiratory: Positive for cough and shortness of breath. Negative for chest tightness.   Cardiovascular: Negative for chest pain and leg swelling.  Musculoskeletal: Positive for arthralgias (hip pain).  Neurological: Negative for dizziness and weakness.       Objective:   Physical Exam  Constitutional: She is oriented to person, place, and time. She appears  well-developed and well-nourished.  She appears ill, wearing a mask  HENT:  Head: Normocephalic and atraumatic.  Cardiovascular: Normal rate and regular rhythm.   Pulmonary/Chest: Effort normal and breath sounds normal. No respiratory distress. She has no wheezes. She has no rales.  Musculoskeletal: She exhibits no edema or tenderness.  Neurological: She is alert and oriented to person, place, and time.  Psychiatric: She has a normal mood and affect. Her behavior is normal.    No blood work today.       Assessment & Plan:  RTC in 3 months, advised to call the clinic for follow up of URI if not better by the weekend.

## 2016-09-05 NOTE — Assessment & Plan Note (Signed)
>>  ASSESSMENT AND PLAN FOR CORONARY ATHEROSCLEROSIS WRITTEN ON 09/05/2016  9:26 AM BY Dudley Mages B, MD  She notes no recent chest pain or issues concerning this.  She has recently been taken off of her beta blocker, pulse is in the 70s today.   Plan Will plan to restart beta blocker at next visit.  Discuss with patient daily baby aspirin  given h/o MI

## 2016-09-05 NOTE — Assessment & Plan Note (Addendum)
She notes no recent chest pain or issues concerning this.  She has recently been taken off of her beta blocker, pulse is in the 70s today.   Plan Will plan to restart beta blocker at next visit.  Discuss with patient daily baby aspirin given h/o MI

## 2016-09-05 NOTE — Assessment & Plan Note (Signed)
Leg swelling improved.  She has been taking xarelto faithfully.   Plan Continue xarelto.

## 2016-09-05 NOTE — Assessment & Plan Note (Signed)
She has had many issues this year concerning hip pain, leg swelling, etc which have made it difficult for her to exercise.  She has been following with [facility] and a dietician.   Continue medical nutrition therapy.

## 2016-09-05 NOTE — Assessment & Plan Note (Signed)
This is the main issue for which she was seen today.  On her current regimen of lasix and hydralazine, her BP was better controlled at 132/84.  She has decreased leg swelling and pain.  She would likely benefit from a beta blocker given her other comorbidities, and I will discuss this with her at next visit.  The BB was discontinued to start verapamil (allergic reaction to this).    Plan Continue Hydralazine, lasix Restart beta blocker at next visit.

## 2016-09-05 NOTE — Assessment & Plan Note (Signed)
Well controlled on daily omeprazole.  Consider descalating therapy to 20mg  daily at next visit.

## 2016-09-05 NOTE — Assessment & Plan Note (Signed)
MMG: 07/2015 - negative - due PAP: s/p hysterectomy - 11/24/2008 - Pap negative, also noted 2005 negative - discuss further PAP with her at next visit.  Flu - in the fall Colonoscopy - Reports having done 6 years ago, will attempt to obtain records Tetanus - 09/19/10 Pneumovax - will need due to asthma - discuss at next visit.  She was ill today.   Dexa - at 67yo

## 2016-09-05 NOTE — Patient Instructions (Addendum)
[name] - -  Your blood pressure is doing much better!  Please keep taking your medications as prescribed.   Please come back to see me in about 3 months, sooner if you need to.   Please try [place] brand cough medicine over the counter for your respiratory illness.  If you are not better by the weekend, please call the clinic.   Thank you.

## 2016-09-07 DIAGNOSIS — R079 Chest pain, unspecified: Secondary | ICD-10-CM | POA: Diagnosis not present

## 2016-09-10 ENCOUNTER — Telehealth: Payer: Self-pay

## 2016-09-10 MED ORDER — AMOXICILLIN-POT CLAVULANATE 875-125 MG PO TABS: 1.0000 | ORAL_TABLET | Freq: Two times a day (BID) | ORAL | 0 refills | Status: AC

## 2016-09-10 NOTE — Telephone Encounter (Signed)
Called patient, she continues to have persistent cough, worse at night, rhinorrhea, sinus pressure, headache, congestion and increased blowing nose with green discharge.  She further has felt very fatigued.  No documented fevers or chills.  Will treat with 5 days of Augmentin.  Advised her to call the clinic if no better after the 5 days and to come in to be seen.  She denied SOB or chest pain.

## 2016-09-10 NOTE — Telephone Encounter (Signed)
Needs to speak with a nurse about cough. Please call back.

## 2016-09-10 NOTE — Telephone Encounter (Signed)
Pt calls and states the cough is worse, she has tried, honey lemon and tea and otc cough med, she feels horrible, can you call some abx in to [facility]?

## 2016-09-14 DIAGNOSIS — R609 Edema, unspecified: Secondary | ICD-10-CM | POA: Diagnosis not present

## 2016-09-14 DIAGNOSIS — M1990 Unspecified osteoarthritis, unspecified site: Secondary | ICD-10-CM | POA: Diagnosis not present

## 2016-09-14 DIAGNOSIS — J45901 Unspecified asthma with (acute) exacerbation: Secondary | ICD-10-CM | POA: Diagnosis not present

## 2016-09-19 ENCOUNTER — Other Ambulatory Visit: Payer: Self-pay | Admitting: Internal Medicine

## 2016-09-19 DIAGNOSIS — Z1231 Encounter for screening mammogram for malignant neoplasm of breast: Secondary | ICD-10-CM

## 2016-09-23 DIAGNOSIS — R609 Edema, unspecified: Secondary | ICD-10-CM | POA: Diagnosis not present

## 2016-10-05 ENCOUNTER — Ambulatory Visit
Admission: RE | Admit: 2016-10-05 | Discharge: 2016-10-05 | Disposition: A | Discharge status: Home / Self Care | Payer: Medicare Other | Source: Ambulatory Visit | Attending: Internal Medicine | Admitting: Internal Medicine

## 2016-10-05 DIAGNOSIS — Z1231 Encounter for screening mammogram for malignant neoplasm of breast: Secondary | ICD-10-CM | POA: Diagnosis not present

## 2016-10-08 DIAGNOSIS — I25118 Atherosclerotic heart disease of native coronary artery with other forms of angina pectoris: Secondary | ICD-10-CM | POA: Diagnosis not present

## 2016-10-08 DIAGNOSIS — I129 Hypertensive chronic kidney disease with stage 1 through stage 4 chronic kidney disease, or unspecified chronic kidney disease: Secondary | ICD-10-CM | POA: Diagnosis not present

## 2016-10-08 DIAGNOSIS — E785 Hyperlipidemia, unspecified: Secondary | ICD-10-CM | POA: Diagnosis not present

## 2016-10-08 DIAGNOSIS — I252 Old myocardial infarction: Secondary | ICD-10-CM | POA: Diagnosis not present

## 2016-10-08 DIAGNOSIS — N189 Chronic kidney disease, unspecified: Secondary | ICD-10-CM | POA: Diagnosis not present

## 2016-10-24 DIAGNOSIS — R609 Edema, unspecified: Secondary | ICD-10-CM | POA: Diagnosis not present

## 2016-10-26 ENCOUNTER — Other Ambulatory Visit: Payer: Self-pay | Admitting: Internal Medicine

## 2016-10-26 DIAGNOSIS — M2550 Pain in unspecified joint: Secondary | ICD-10-CM

## 2016-10-26 NOTE — Telephone Encounter (Signed)
Refill Request Appt with PCP on 12/05/2016.  Per pt unable to sch for Sept due to be full  Oxycodone

## 2016-10-26 NOTE — Telephone Encounter (Signed)
Last rx written - 08/01/16 x 3 scripts. Last appt 09/05/16. Next appt 12/05/16. UDS - 02/16/16.

## 2016-10-30 MED ORDER — OXYCODONE-ACETAMINOPHEN 10-325 MG PO TABS: 1.0000 | ORAL_TABLET | Freq: Three times a day (TID) | ORAL | 0 refills | Status: DC | PRN

## 2016-10-30 NOTE — Telephone Encounter (Signed)
UDS appropriate.  [place] narcotic database reviewed and appropriate.  Appears that the pharmacy put me in under [facility] (not clear what other provider that would be, but can address if [place]).  Will refill X 3.

## 2016-10-30 NOTE — Telephone Encounter (Signed)
Rxs ready for pick up ; pt called. 

## 2016-11-06 DIAGNOSIS — J45901 Unspecified asthma with (acute) exacerbation: Secondary | ICD-10-CM | POA: Diagnosis not present

## 2016-11-06 DIAGNOSIS — M1990 Unspecified osteoarthritis, unspecified site: Secondary | ICD-10-CM | POA: Diagnosis not present

## 2016-11-06 DIAGNOSIS — R609 Edema, unspecified: Secondary | ICD-10-CM | POA: Diagnosis not present

## 2016-11-16 ENCOUNTER — Ambulatory Visit: Payer: Self-pay

## 2016-11-22 ENCOUNTER — Other Ambulatory Visit: Payer: Self-pay | Admitting: Internal Medicine

## 2016-11-24 DIAGNOSIS — R609 Edema, unspecified: Secondary | ICD-10-CM | POA: Diagnosis not present

## 2016-11-30 ENCOUNTER — Other Ambulatory Visit: Payer: Self-pay | Admitting: Internal Medicine

## 2016-12-05 ENCOUNTER — Ambulatory Visit (INDEPENDENT_AMBULATORY_CARE_PROVIDER_SITE_OTHER): Payer: Medicare Other | Admitting: Dietician

## 2016-12-05 ENCOUNTER — Ambulatory Visit (INDEPENDENT_AMBULATORY_CARE_PROVIDER_SITE_OTHER): Payer: Medicare Other | Admitting: Internal Medicine

## 2016-12-05 ENCOUNTER — Encounter: Payer: Self-pay | Admitting: Internal Medicine

## 2016-12-05 DIAGNOSIS — K219 Gastro-esophageal reflux disease without esophagitis: Secondary | ICD-10-CM | POA: Diagnosis not present

## 2016-12-05 DIAGNOSIS — Z6841 Body Mass Index (BMI) 40.0 and over, adult: Secondary | ICD-10-CM

## 2016-12-05 DIAGNOSIS — M2550 Pain in unspecified joint: Secondary | ICD-10-CM | POA: Diagnosis not present

## 2016-12-05 DIAGNOSIS — N183 Chronic kidney disease, stage 3 unspecified: Secondary | ICD-10-CM

## 2016-12-05 DIAGNOSIS — I517 Cardiomegaly: Secondary | ICD-10-CM | POA: Diagnosis not present

## 2016-12-05 DIAGNOSIS — D649 Anemia, unspecified: Secondary | ICD-10-CM

## 2016-12-05 DIAGNOSIS — E7800 Pure hypercholesterolemia, unspecified: Secondary | ICD-10-CM

## 2016-12-05 DIAGNOSIS — I82403 Acute embolism and thrombosis of unspecified deep veins of lower extremity, bilateral: Secondary | ICD-10-CM

## 2016-12-05 DIAGNOSIS — I10 Essential (primary) hypertension: Secondary | ICD-10-CM | POA: Diagnosis not present

## 2016-12-05 DIAGNOSIS — Z96641 Presence of right artificial hip joint: Secondary | ICD-10-CM

## 2016-12-05 DIAGNOSIS — Z713 Dietary counseling and surveillance: Secondary | ICD-10-CM

## 2016-12-05 DIAGNOSIS — M1611 Unilateral primary osteoarthritis, right hip: Secondary | ICD-10-CM | POA: Diagnosis not present

## 2016-12-05 DIAGNOSIS — R32 Unspecified urinary incontinence: Secondary | ICD-10-CM

## 2016-12-05 DIAGNOSIS — F1011 Alcohol abuse, in remission: Secondary | ICD-10-CM

## 2016-12-05 DIAGNOSIS — Z87898 Personal history of other specified conditions: Secondary | ICD-10-CM

## 2016-12-05 DIAGNOSIS — Z993 Dependence on wheelchair: Secondary | ICD-10-CM

## 2016-12-05 DIAGNOSIS — R748 Abnormal levels of other serum enzymes: Secondary | ICD-10-CM

## 2016-12-05 DIAGNOSIS — Z0000 Encounter for general adult medical examination without abnormal findings: Secondary | ICD-10-CM

## 2016-12-05 MED ORDER — OXYCODONE-ACETAMINOPHEN 10-325 MG PO TABS: 1.0000 | ORAL_TABLET | Freq: Three times a day (TID) | ORAL | 0 refills | Status: DC | PRN

## 2016-12-05 MED ORDER — HYDRALAZINE HCL 25 MG PO TABS: 50.0000 mg | ORAL_TABLET | Freq: Two times a day (BID) | ORAL | 2 refills | Status: DC

## 2016-12-05 MED ORDER — OMEPRAZOLE 20 MG PO CPDR: 20.0000 mg | DELAYED_RELEASE_CAPSULE | Freq: Every day | ORAL | 5 refills | Status: DC

## 2016-12-05 MED ORDER — TRAZODONE HCL 50 MG PO TABS: 50.0000 mg | ORAL_TABLET | Freq: Every day | ORAL | 5 refills | Status: DC

## 2016-12-05 NOTE — Assessment & Plan Note (Signed)
GGT and vitamin D normal.  Follow and trend.  No GI symptoms today.

## 2016-12-05 NOTE — Assessment & Plan Note (Signed)
MMG done this year, negative.

## 2016-12-05 NOTE — Assessment & Plan Note (Signed)
She is taking xarelto without issues.  She notes that she will have occasional bruising on her abdomen.  She has no blood loss in her GI tract or urine which she reported to me today.   Plan Continue xarelto, lifelong.

## 2016-12-05 NOTE — Assessment & Plan Note (Signed)
BP was elevated today.  She has had issues with allergic reactions and we have been modifying her BP regimen over the last few months.   Plan Continue lasix and verapamil Increase hydralazine to  BID.  RTC in 1 month for BP recheck.

## 2016-12-05 NOTE — Assessment & Plan Note (Addendum)
>>  ASSESSMENT AND PLAN FOR OSTEOARTHRITIS OF RIGHT HIP WRITTEN ON 12/05/2016  3:30 PM BY Demiana Crumbley B, MD  She has had a right hip replacement.  Has persistent issues with gait and stability and occasional persistent pain that hip.  She is on chronic narcotic therapy for osteoarthritis in multiple sites.  She has an MME of 45 per day which is a lower risk.  Narcotic database was appropriate on check today.    Plan She does not need refills at this time, refill at next visit.   Last Tox screen in December okay.    >>ASSESSMENT AND PLAN FOR STATUS POST TOTAL REPLACEMENT OF RIGHT HIP WRITTEN ON 12/05/2016  3:33 PM BY Karrisa Didio B, MD  She is doing well, but continues to have issues ambulating around her home.  She is requesting a new walker due to the regular height walker causing back pain and she continues to have intermittent falls due to joint pain.   Plan Order [employer] walker.

## 2016-12-05 NOTE — Assessment & Plan Note (Signed)
Noted on a TTE over the summer.  She did have mild LVH and grade 1 diastolic dysfunction.  She has never had an exacerbation, but does have some chronic swelling for which she takes lasix.   Monitor.

## 2016-12-05 NOTE — Patient Instructions (Addendum)
Please write down everything you eat and drink for the next week and bring this back to your next visit.   Write your weight down on the same sheet each day.   Please make an appointment in 1-2 weeks.    Writing down what we eat and drink can help [name] to see it in a different way. It may help [name] to change habits. What we eat and drink are habits formed during our lifetime. Habits are learned and can be unlearned and changed.   Please write down the foods and beverages you have during the day. Please include at least the time you eat and drink, what you eat and drink and how much you eat and drink. Includes as much detail as you can.  For example:  Time   What    How much 8 AM  Cooked oatmeal   1 Cup   Coffee     1 mug                         Sugar in oatmeal   1 teaspoon         Sugar in coffee   2 teaspoons   Milk in oatmeal   1/4 cup   Creamer in coffee    2 teaspoons   Margarine in oatmeal   1 teaspoon

## 2016-12-05 NOTE — Progress Notes (Signed)
  Medical Nutrition Therapy:  Appt start time: 0905 AM   end time:  0930 AM. Visit #4   Assessment:  Primary concerns today: weight loss and healthy eating  for kidney disease,anemia. Vanessa Palmer is upset because of her weight regain of 7.5#. She is supposed to weigh daily at home, but gets depressed and stops. She reports when she gets upset, she eats more. Diet recall has high salt items, more fruits and vegetables.  ATHROPOMETRICS: weight-251# , increased 7.5#. BMI- 45.9 obese class III SLEEP: not addressed today Activity: ADLS, sedentary-in a motorized wheelchair today, she is not going to any groups and says she'd like to try [facility]  DIETARY INTAKE: Usual eating pattern includes 2 meals and 1-2 snacks per day.  Egg and milk give her gas B: fruit of some kind  L: salad, vegetables only D: [place] wings in cream of chicken soup, greens  Estimated energy needs: 1500-1600 for gradual loss 180- 200g carbohydrates 78 grams protein/day   Progress Towards Goal(s):  Some progress.   Nutritional Diagnosis:  NB-1.1 Food and nutrition-related knowledge deficit As related to lack of sufficient prior meal planning training and education continues to improve but self confidence is lacking As evidenced by her returning for additional assistance and support with lifestyle behavior change, goal setting.     Intervention:  Nutrition education about self monitoring, emotional eating.  Success and barrier to  Reaching goals Teaching Method Utilized: Visual,,Auditory,Hands on Handouts given during visit include: AVS Barriers to learning/adherence to lifestyle change: competing values, support, unhealthy food environment Demonstrated degree of understanding via:  Teach Back   Monitoring/Evaluation:  Dietary intake, exercise, and body weight in 1 week(s) Plyler, Vanessa Palmer, RD 12/05/2016 11:06 AM. .

## 2016-12-05 NOTE — Patient Instructions (Addendum)
Ms. Repsher - -  For your blood pressure -  Please increase your hydralazine to  twice a day.  This will be 2 tablets 2 times per day.    It is fine to take up to 2 tablets of your trazodone, but not anymore.    We will call your insurance about your walker and let you know if we can order one or you will need to pay out of pocket for a new one.    For your weight, you are doing an excellent job in trying to lose weight.  Please keep up the good work.  Try to only weigh yourself on the same scale to see your progress.  Sometimes scales can be very different in the weight they report.    Thank you!  Please come back in 1 month for your blood pressure.

## 2016-12-05 NOTE — Progress Notes (Signed)
Subjective:    Patient ID: Vanessa Palmer, female    DOB: Jul 15, 1950, 66 y.o.   MRN: [id number]  CC:  3 month follow up for HTN  HPI  Vanessa Palmer is a 66yo woman with PMH of chronic arthritis, GERD, HTN, HLD, urinary incontinence, asthma who presents for follow up of her BP.   Today Vanessa Palmer reports that Vanessa Palmer is doing relatively well.  Vanessa Palmer had a fall since I last saw her which Vanessa Palmer states was due to her knee giving out.  Vanessa Palmer has significant history of joint issues in the knees and hips.  Vanessa Palmer is using a motorized chair for out of house movement and uses a walker for in house movement.  Vanessa Palmer currently has a walker that is normal height, but Vanessa Palmer finds herself crouching over the walker and having worsening back pain.  Vanessa Palmer is requesting a "tall" walker today to help her so that Vanessa Palmer does not crouch.  Vanessa Palmer continues to have falls and I think this walker would be beneficial to her.    Today, her BP was elevated on presentation to the clinic at 183/86.  On recheck Vanessa Palmer was 162/93.  Vanessa Palmer is taking hydralazine  BID, lasix  daily and verapamil  daily (though Vanessa Palmer was supposed to be off this medication, Vanessa Palmer is tolerating this well).  Vanessa Palmer reports taking them as prescribed and has no issues with headaches, blurry vision or chest pain.    Vanessa Palmer further notes that Vanessa Palmer feels depressed about her weight. Vanessa Palmer has been following with [facility] and would like to lose weight.  Vanessa Palmer notes that Vanessa Palmer will weigh herself whenever Vanessa Palmer has a chance and will notice that Vanessa Palmer is a few pounds up and then a few pounds down.  Her trend noted in the chart is downward right now, but Vanessa Palmer did have an increase compared to last check here in the clinic.  We discussed how scales can be significantly different and Vanessa Palmer should choose one scale to check her trends, either ours or her one at home.    At the end of the visit, Vanessa Palmer noted that Vanessa Palmer was taking up to two trazodone ( ) at night to get sleep.  Vanessa Palmer has had no issues with daytime  drowsiness.  There are no renal restrictions for this medication.  Review of Systems  Constitutional: Negative for activity change, fatigue, fever and unexpected weight change.  Respiratory: Negative for cough and choking.   Cardiovascular: Negative for chest pain.  Genitourinary: Negative for difficulty urinating and dysuria.  Musculoskeletal: Positive for arthralgias and gait problem (using motorized chair).  Skin: Positive for color change (small areas of bruising).  Neurological: Negative for dizziness and weakness.       Objective:   Physical Exam  Constitutional: Vanessa Palmer is oriented to person, place, and time.  Vanessa Palmer is an obese woman in a motorized wheelchair.  Vanessa Palmer is well developed, no distress  HENT:  Head: Normocephalic and atraumatic.  Cardiovascular: Normal rate, regular rhythm and normal heart sounds.   No murmur heard. Pulmonary/Chest: Effort normal and breath sounds normal. No respiratory distress. Vanessa Palmer has no wheezes.  Musculoskeletal: Vanessa Palmer exhibits no edema.  Neurological: Vanessa Palmer is alert and oriented to person, place, and time.  Skin:  Vanessa Palmer has a small healing bruise on her left thigh, reports Vanessa Palmer is not sure where this came from.    Psychiatric: Vanessa Palmer has a normal mood and affect. Her behavior is normal.   No labs today  Assessment & Plan:  RTC in 1 month for BP check.

## 2016-12-05 NOTE — Assessment & Plan Note (Signed)
Well controled with oxybutynin which will be continued.

## 2016-12-05 NOTE — Assessment & Plan Note (Signed)
No longer drinking ETOH

## 2016-12-05 NOTE — Assessment & Plan Note (Signed)
She is doing well, but continues to have issues ambulating around her home.  She is requesting a new walker due to the regular height walker causing back pain and she continues to have intermittent falls due to joint pain.   Plan Order Tall [employer] walker.

## 2016-12-05 NOTE — Assessment & Plan Note (Signed)
She is taking atorvastatin without issue.  Her last Lipid panel was well controlled.   Plan Continue atorvastatin.

## 2016-12-05 NOTE — Assessment & Plan Note (Signed)
Well controlled on current dose of omeprazole.  We discussed trial of lower dose of  and she is amenable to this plan.   Plan Decrease omeprazole to  daily to de-escalate therapy.

## 2016-12-10 DIAGNOSIS — M2550 Pain in unspecified joint: Secondary | ICD-10-CM | POA: Diagnosis not present

## 2016-12-10 DIAGNOSIS — J45901 Unspecified asthma with (acute) exacerbation: Secondary | ICD-10-CM | POA: Diagnosis not present

## 2016-12-10 DIAGNOSIS — G4733 Obstructive sleep apnea (adult) (pediatric): Secondary | ICD-10-CM | POA: Diagnosis not present

## 2016-12-10 DIAGNOSIS — M1990 Unspecified osteoarthritis, unspecified site: Secondary | ICD-10-CM | POA: Diagnosis not present

## 2016-12-12 ENCOUNTER — Ambulatory Visit (INDEPENDENT_AMBULATORY_CARE_PROVIDER_SITE_OTHER): Payer: Medicare Other | Admitting: Dietician

## 2016-12-12 ENCOUNTER — Encounter: Payer: Self-pay | Admitting: Dietician

## 2016-12-12 DIAGNOSIS — Z993 Dependence on wheelchair: Secondary | ICD-10-CM

## 2016-12-12 DIAGNOSIS — D649 Anemia, unspecified: Secondary | ICD-10-CM

## 2016-12-12 DIAGNOSIS — N183 Chronic kidney disease, stage 3 unspecified: Secondary | ICD-10-CM

## 2016-12-12 DIAGNOSIS — Z6841 Body Mass Index (BMI) 40.0 and over, adult: Secondary | ICD-10-CM | POA: Diagnosis not present

## 2016-12-12 DIAGNOSIS — Z713 Dietary counseling and surveillance: Secondary | ICD-10-CM | POA: Diagnosis not present

## 2016-12-12 NOTE — Patient Instructions (Signed)
Keep writing down what you eat and drink and your weight.  Use 1/2 cup sugar in Koolaid.  Try drinking tea from teabags instead of soda or sugar free tea.  Try to eat more vegetables. Twice as many vegetables and eat them first.   See you in two weeks

## 2016-12-12 NOTE — Progress Notes (Signed)
  Medical Nutrition Therapy:  Appt start time: 0905 AM   end time:  0945 AM. Visit #5   Assessment:  Primary concerns today: weight loss and healthy eating  for kidney disease,anemia. [name] enjoyed keeping her food records for the [past week. She weighed at home and recorded. Diet record shows high added sugar foods/drinks, insufficient fruits, vegetable and whole grains. A recall of her refrigerator and pantry items reveals she has more high animal protein and fat, low nutrient value foods on hand. No vegetables and few fruits on hand.  She intends to visit a food pantry today.   ATHROPOMETRICS: weight-248.6#. BMI- 45.5 obese class III SLEEP: not addressed today Activity: ADLS, sedentary-in a motorized wheelchair, goes to 2 groups that do sedentary activities like cards and art  DIETARY INTAKE: Usual eating pattern includes 2 meals and 1-2 snacks per day.  Does not eat beans, milk B(7-8AM): fruit of some kind or bread or egg salad and ritz crackers L: skipped D(7-8 PM<): fried chicken or chicken wings, cabbage, koolaid, soda or sweet tea or waterurkey wings in cream  Estimated energy needs: 1500-1600 for gradual loss 180- 200g carbohydrates 78 grams protein/day   Progress Towards Goal(s):  Some progress.   Nutritional Diagnosis:  NB-1.1 Food and nutrition-related knowledge deficit As related to lack of sufficient prior meal planning training and support continues to improve and self confidence is lacking As evidenced by her returning for additional assistance and support with lifestyle behavior change, goal setting.     Intervention:  Nutrition education about self monitoring, methods of planning meals to increase nutrient value.  Success and barrier to  Reaching goals Teaching Method Utilized: Visual,,Auditory,Hands on Handouts given during visit include: AVS Barriers to learning/adherence to lifestyle change: competing values, support, unhealthy food environment Demonstrated  degree of understanding via:  Teach Back   Monitoring/Evaluation:  Dietary intake, exercise, and body weight in 2 week(s) Vanessa Palmer, Vanessa Palmer, RD 12/12/2016 1:42 PM. .

## 2016-12-25 ENCOUNTER — Ambulatory Visit (INDEPENDENT_AMBULATORY_CARE_PROVIDER_SITE_OTHER): Payer: Medicare Other | Admitting: Dietician

## 2016-12-25 ENCOUNTER — Encounter: Payer: Self-pay | Admitting: Dietician

## 2016-12-25 DIAGNOSIS — N183 Chronic kidney disease, stage 3 unspecified: Secondary | ICD-10-CM

## 2016-12-25 DIAGNOSIS — Z993 Dependence on wheelchair: Secondary | ICD-10-CM | POA: Diagnosis not present

## 2016-12-25 DIAGNOSIS — Z6841 Body Mass Index (BMI) 40.0 and over, adult: Secondary | ICD-10-CM | POA: Diagnosis not present

## 2016-12-25 DIAGNOSIS — Z713 Dietary counseling and surveillance: Secondary | ICD-10-CM | POA: Diagnosis not present

## 2016-12-25 NOTE — Progress Notes (Signed)
  Medical Nutrition Therapy:  Appt start time: 1315 PM   end time:  1345AM. Visit #6  Assessment:  Primary concerns today: weight loss and healthy eating  for kidney disease, anemia. Vanessa Palmer kept food records for the past 2 weeks. She weighed at home and recorded her weight and has lost 2 #. Diet record shows low nutrient quality food choices, less sugar, insufficient fruits, vegetable and whole grains. A recall of her refrigerator and pantry items reveals she has more high animal protein and fat, some canned vegetables, no fruits or dairy.  She intends to grocery shops later this week.    ATHROPOMETRICS: weight-246.8#. BMI- 45.5 obese class III SLEEP: "poor without sleeping pills", she says she is told she snores, wakes up and plays on her tablet.  Activity: ADLS, sedentary-in a motorized wheelchair, goes to 2 groups that do sedentary activities like cards and art  Estimated energy needs: 1500-1600 for gradual loss 180- 200g carbohydrates 78 grams protein/day   Progress Towards Goal(s):  Some progress.   Nutritional Diagnosis:  NB-1.1 Food and nutrition-related knowledge deficit As related to lack of sufficient prior meal planning training and support continues to improve and self confidence is lacking As evidenced by her returning for additional assistance and support with lifestyle behavior change, goal setting.     Intervention:  Nutrition education about sleep hygiene and its importance to health and weight,  methods of planning meals to increase nutrient value.  Teaching Method Utilized: Visual,,Auditory,Hands on Handouts given during visit include: AVS Barriers to learning/adherence to lifestyle change: competing values, support, unhealthy food environment Demonstrated degree of understanding via:  Teach Back   Monitoring/Evaluation:  Dietary intake, exercise, and body weight in 2 week(s) Plyler, Vanessa Palmer, RD 12/25/2016 2:29 PM. .

## 2016-12-25 NOTE — Patient Instructions (Addendum)
Over the next 2 weeks-   Eat more vegetables,   Eat more fruit.  Have meal replacement for lunch.   Keep writing down everything you eat and drink! You doing great!

## 2017-01-02 ENCOUNTER — Other Ambulatory Visit: Payer: Self-pay | Admitting: Internal Medicine

## 2017-01-02 DIAGNOSIS — M1990 Unspecified osteoarthritis, unspecified site: Secondary | ICD-10-CM | POA: Diagnosis not present

## 2017-01-02 DIAGNOSIS — J45901 Unspecified asthma with (acute) exacerbation: Secondary | ICD-10-CM | POA: Diagnosis not present

## 2017-01-02 DIAGNOSIS — R609 Edema, unspecified: Secondary | ICD-10-CM | POA: Diagnosis not present

## 2017-01-02 MED ORDER — VERAPAMIL HCL ER 120 MG PO TBCR: 120.0000 mg | EXTENDED_RELEASE_TABLET | Freq: Every day | ORAL | 1 refills | Status: DC

## 2017-01-02 NOTE — Telephone Encounter (Signed)
Patient is requesting a refill on verapamil 120mg , [facility]

## 2017-01-08 ENCOUNTER — Ambulatory Visit: Payer: Self-pay | Admitting: Dietician

## 2017-01-21 ENCOUNTER — Other Ambulatory Visit: Payer: Self-pay | Admitting: Cardiology

## 2017-01-21 DIAGNOSIS — I25110 Atherosclerotic heart disease of native coronary artery with unstable angina pectoris: Secondary | ICD-10-CM | POA: Diagnosis not present

## 2017-01-21 DIAGNOSIS — N189 Chronic kidney disease, unspecified: Secondary | ICD-10-CM | POA: Diagnosis not present

## 2017-01-21 DIAGNOSIS — I129 Hypertensive chronic kidney disease with stage 1 through stage 4 chronic kidney disease, or unspecified chronic kidney disease: Secondary | ICD-10-CM | POA: Diagnosis not present

## 2017-01-21 DIAGNOSIS — E785 Hyperlipidemia, unspecified: Secondary | ICD-10-CM | POA: Diagnosis not present

## 2017-01-21 DIAGNOSIS — R079 Chest pain, unspecified: Secondary | ICD-10-CM

## 2017-01-23 ENCOUNTER — Ambulatory Visit (INDEPENDENT_AMBULATORY_CARE_PROVIDER_SITE_OTHER): Payer: Medicare Other | Admitting: Dietician

## 2017-01-23 ENCOUNTER — Other Ambulatory Visit: Payer: Self-pay

## 2017-01-23 ENCOUNTER — Ambulatory Visit (INDEPENDENT_AMBULATORY_CARE_PROVIDER_SITE_OTHER): Payer: Medicare Other | Admitting: Internal Medicine

## 2017-01-23 ENCOUNTER — Encounter: Payer: Self-pay | Admitting: Internal Medicine

## 2017-01-23 DIAGNOSIS — D631 Anemia in chronic kidney disease: Secondary | ICD-10-CM

## 2017-01-23 DIAGNOSIS — N183 Chronic kidney disease, stage 3 unspecified: Secondary | ICD-10-CM

## 2017-01-23 DIAGNOSIS — G4762 Sleep related leg cramps: Secondary | ICD-10-CM

## 2017-01-23 DIAGNOSIS — Z6841 Body Mass Index (BMI) 40.0 and over, adult: Secondary | ICD-10-CM

## 2017-01-23 DIAGNOSIS — Z713 Dietary counseling and surveillance: Secondary | ICD-10-CM

## 2017-01-23 DIAGNOSIS — M2550 Pain in unspecified joint: Secondary | ICD-10-CM

## 2017-01-23 DIAGNOSIS — I10 Essential (primary) hypertension: Secondary | ICD-10-CM | POA: Diagnosis not present

## 2017-01-23 MED ORDER — OXYCODONE-ACETAMINOPHEN 10-325 MG PO TABS: 1.0000 | ORAL_TABLET | Freq: Three times a day (TID) | ORAL | 0 refills | Status: DC | PRN

## 2017-01-23 NOTE — Progress Notes (Signed)
   Subjective:    Patient ID: Vanessa Palmer, female    DOB: December 04, 1950, 66 y.o.   MRN: [id number]  6 week follow up for elevated BP  HPI   Vanessa Palmer is a 66yo woman with HTN, asthma, DVT on xarelto who presents for follow up of elevated BP.  We have been having issues with her BP for a while.  We had to stop amlodipine for painful LE swelling which improved.  She was on an ACE-I for proteinuria/CKD but developed allergy to the medication.  She was most recently on verapamil (chosen for proteinuria effect) and metoprolol was stopped.  She was also being up titrated on her hyralazine.  Since I last saw her, she went to see her Cardiologist who stopped her verapamil and hydralazine and restarted amlodipine and metoprolol.  Her BP is improved today (this was her previous regimen prior to her getting LE edema).  I am hesitant to change at this time, given it will be confusing for Vanessa Palmer, but we will be on the look out for any further reactions to amlodipine.  She has been having increased dizzy spells since starting the metoprolol.  She also tells me that she informed Dr. Sharyn Palmer of intermittent chest pains, worse with activity and radiating down her left arm.  There is a NM stress test planned on 02/04/17.    She also tells me that she has bad leg cramps at night, mainly when she is going to bed and occasionally waking her from sleep.  She notes that she takes mustard and it helps, but that she is tired of eating mustard.  We discussed Bcomplex vitamins and I will further discuss stretching with her at next visit.    She has lost 3 pounds and I congratulated her on this effort.      Review of Systems  Constitutional: Positive for fatigue. Negative for activity change.  Respiratory: Negative for cough and shortness of breath.   Cardiovascular: Positive for chest pain and leg swelling (mild).  Neurological: Positive for dizziness and light-headedness. Negative for syncope and weakness.      Objective:   Physical Exam  Constitutional: She is oriented to person, place, and time. She appears well-developed and well-nourished. No distress.  Cardiovascular: Regular rhythm and normal heart sounds.  No murmur heard. Bradycardic   Pulmonary/Chest: Effort normal. No respiratory distress.  Musculoskeletal: She exhibits edema (mild to mid calf) and tenderness (throughout, chronic for her).  Neurological: She is alert and oriented to person, place, and time.  Psychiatric: Her behavior is normal.  Vitals reviewed.      Assessment & Plan:  RTC in 3 months.

## 2017-01-23 NOTE — Patient Instructions (Signed)
Good job with keeping food records!  Take grocery list to store.  Tell me how the broccoli salad turns out.   Good job getting back to exercising!  Hope you get your walker with a seat.

## 2017-01-23 NOTE — Assessment & Plan Note (Signed)
She is losing weight, which she is very excited about.   Plan Continue [facility] counseling.

## 2017-01-23 NOTE — Progress Notes (Signed)
  Medical Nutrition Therapy:  Appt start time: 0815 PM   end time:  840AM. Visit #7  Assessment:  Primary concerns today: weight loss and healthy eating  for kidney disease, anemia. [name] kept food records for the past 2 weeks- she say this is helping her eat more balanced meals and mind what she is earting. She weighed at home and recorded her weight and has lost 4 more pounds. Diet record shows more vegetables, less sugar, insufficient fruits. She is limited by lack of material resources. She accessed our pantry today. She made alist for her monthly  grocery shopping.     ATHROPOMETRICS: weight-242.9#. BMI- 45.5 obese class III SLEEP: not assessed today Activity: ADLS, working on getting walker to help her be more active. Attended group exercise that restarted in her building  Estimated energy needs: 1500-1600 for gradual loss 180- 200g carbohydrates 78 grams protein/day   Progress Towards Goal(s):  Some progress.   Nutritional Diagnosis:  NB-1.1 Food and nutrition-related knowledge deficit As related to lack of sufficient prior meal planning training and support continues to improve and self confidence is lacking As evidenced by her returning for additional assistance and support with lifestyle behavior change, goal setting.     Intervention:  Nutrition education about methods of planning meals to increase nutrient value. Coordination of care-  address sleep problems Teaching Method Utilized: Visual,,Auditory,Hands on Handouts given during visit include: AVS Barriers to learning/adherence to lifestyle change: competing values, support, unhealthy food environment Demonstrated degree of understanding via:  Teach Back   Monitoring/Evaluation:  Dietary intake, exercise, and body weight in 2 week(s) Zitlali Primm, Lupita Leashonna, RD 01/23/2017 8:58 AM. .

## 2017-01-23 NOTE — Assessment & Plan Note (Signed)
Worsened again.  Taking mustard.   Advised B vitamin supplementation.   Check Vitamin D level at next visit.

## 2017-01-23 NOTE — Assessment & Plan Note (Signed)
BP better controlled today, however, she is back on original medications which she has had issues with.  Will continue these current medications for now and follow up to see if she has any adverse issues with them.   She would benefit from an ACE-I or ARB given CKD, however, she can not tolerate these due to allergy.   Plan Continue metoprolol 50mg  daily, amlodipine 5mg  daily

## 2017-01-23 NOTE — Assessment & Plan Note (Signed)
She is due for her Pain medications refill.  She has no red flags.  [place] narcotic database reviewed and appropriate.  Last Tox screen appropriate.  Will need updated Tox screen in new year.   Refill Oxycodone APAP at current dose  3 Rx given today.

## 2017-01-23 NOTE — Patient Instructions (Signed)
Vanessa Palmer - -  Your blood pressure is much better controlled today.  Please continue your medications which Dr. Sharyn LullHarwani prescribed you.   You have had issues with swelling related to the amlodipine before, so if this starts again, please call the clinic and we will stop this and start back your hydralazine.    Thank you!  Come back to see me in 3 months.

## 2017-02-04 ENCOUNTER — Ambulatory Visit (HOSPITAL_COMMUNITY): Payer: Medicare Other

## 2017-02-07 ENCOUNTER — Telehealth: Payer: Self-pay | Admitting: Dietician

## 2017-02-07 ENCOUNTER — Ambulatory Visit: Payer: Self-pay | Admitting: Dietician

## 2017-02-07 NOTE — Telephone Encounter (Signed)
Discussed self care, she reports continuing food recording, trying to be active as possible. Has not gotten walker yet. Her appointment for today was rescheduled.

## 2017-02-08 ENCOUNTER — Ambulatory Visit (HOSPITAL_COMMUNITY)
Admission: RE | Admit: 2017-02-08 | Discharge: 2017-02-08 | Disposition: A | Discharge status: Home / Self Care | Payer: Medicare Other | Source: Ambulatory Visit | Attending: Cardiology | Admitting: Cardiology

## 2017-02-08 DIAGNOSIS — R079 Chest pain, unspecified: Secondary | ICD-10-CM

## 2017-02-08 DIAGNOSIS — N189 Chronic kidney disease, unspecified: Secondary | ICD-10-CM | POA: Diagnosis not present

## 2017-02-08 DIAGNOSIS — I2589 Other forms of chronic ischemic heart disease: Secondary | ICD-10-CM | POA: Diagnosis not present

## 2017-02-08 DIAGNOSIS — I129 Hypertensive chronic kidney disease with stage 1 through stage 4 chronic kidney disease, or unspecified chronic kidney disease: Secondary | ICD-10-CM | POA: Diagnosis not present

## 2017-02-08 DIAGNOSIS — I25110 Atherosclerotic heart disease of native coronary artery with unstable angina pectoris: Secondary | ICD-10-CM | POA: Diagnosis not present

## 2017-02-08 DIAGNOSIS — I252 Old myocardial infarction: Secondary | ICD-10-CM | POA: Diagnosis not present

## 2017-02-08 DIAGNOSIS — E785 Hyperlipidemia, unspecified: Secondary | ICD-10-CM | POA: Diagnosis not present

## 2017-02-08 MED ORDER — REGADENOSON 0.4 MG/5ML IV SOLN
INTRAVENOUS | Status: AC
  Filled 2017-02-08: qty 5

## 2017-02-08 MED ORDER — TECHNETIUM TC 99M TETROFOSMIN IV KIT
30.0000 | PACK | Freq: Once | INTRAVENOUS | Status: AC | PRN
  Administered 2017-02-08: 30 via INTRAVENOUS

## 2017-02-08 MED ORDER — DIPHENHYDRAMINE HCL 50 MG/ML IJ SOLN
25.0000 mg | Freq: Once | INTRAMUSCULAR | Status: AC
  Administered 2017-02-08: 25 mg via INTRAVENOUS

## 2017-02-08 MED ORDER — TECHNETIUM TC 99M TETROFOSMIN IV KIT
10.0000 | PACK | Freq: Once | INTRAVENOUS | Status: AC | PRN
  Administered 2017-02-08: 10 via INTRAVENOUS

## 2017-02-08 MED ORDER — DIPHENHYDRAMINE HCL 50 MG/ML IJ SOLN
INTRAMUSCULAR | Status: AC
  Filled 2017-02-08: qty 1

## 2017-02-08 MED ORDER — REGADENOSON 0.4 MG/5ML IV SOLN
0.4000 mg | Freq: Once | INTRAVENOUS | Status: AC
  Administered 2017-02-08: 0.4 mg via INTRAVENOUS

## 2017-02-11 ENCOUNTER — Telehealth: Payer: Self-pay | Admitting: Internal Medicine

## 2017-02-11 NOTE — Telephone Encounter (Signed)
[facility].  Patient's update PCS form was rec'd at their office on 01/29/2014.  PCS form will be faxed back to or office for another correction.   [facility] to contact our office back if she had any questions.

## 2017-02-12 ENCOUNTER — Encounter: Payer: Self-pay | Admitting: Dietician

## 2017-02-12 ENCOUNTER — Ambulatory Visit (INDEPENDENT_AMBULATORY_CARE_PROVIDER_SITE_OTHER): Payer: Medicare Other | Admitting: Dietician

## 2017-02-12 DIAGNOSIS — N183 Chronic kidney disease, stage 3 unspecified: Secondary | ICD-10-CM

## 2017-02-12 DIAGNOSIS — D649 Anemia, unspecified: Secondary | ICD-10-CM

## 2017-02-12 DIAGNOSIS — Z713 Dietary counseling and surveillance: Secondary | ICD-10-CM | POA: Diagnosis not present

## 2017-02-12 DIAGNOSIS — I252 Old myocardial infarction: Secondary | ICD-10-CM | POA: Diagnosis not present

## 2017-02-12 DIAGNOSIS — I25110 Atherosclerotic heart disease of native coronary artery with unstable angina pectoris: Secondary | ICD-10-CM | POA: Diagnosis not present

## 2017-02-12 DIAGNOSIS — N189 Chronic kidney disease, unspecified: Secondary | ICD-10-CM | POA: Diagnosis not present

## 2017-02-12 DIAGNOSIS — Z6841 Body Mass Index (BMI) 40.0 and over, adult: Secondary | ICD-10-CM | POA: Diagnosis not present

## 2017-02-12 DIAGNOSIS — R9439 Abnormal result of other cardiovascular function study: Secondary | ICD-10-CM | POA: Diagnosis not present

## 2017-02-12 DIAGNOSIS — I129 Hypertensive chronic kidney disease with stage 1 through stage 4 chronic kidney disease, or unspecified chronic kidney disease: Secondary | ICD-10-CM | POA: Diagnosis not present

## 2017-02-12 NOTE — Progress Notes (Signed)
  Medical Nutrition Therapy:  Appt start time: 1400 PM   end time:  1439 PM. Visit #8  Assessment:  Primary concerns today: weight loss and healthy eating  for kidney disease, anemia. [name] kept food records for the past 2 weeks- she say this is helping her eat more balanced meals and mind what she is eating.  She took the list to the grocery and says this helped her. She weighed at home and recorded her weight and has lost 2 more pounds.  Diet record shows more balanced meals, fruits, high cholesterol/sat fat breakfasts and meat choices.  She is limited by lack of material resources. She accessed our pantry today.  She made a second list for her next  grocery shopping.     ATHROPOMETRICS: weight-240.8#. BMI- 44.04 obese class III- new weight goal next visit 230# per patient SLEEP: reports her sleep is not very good- thinks she needs a new bed, also naps during the day Activity: ADLS, working on getting walker to help her be more active.  Estimated energy needs: 1500-1600 for gradual loss 180- 200g carbohydrates 78 grams protein/day   Progress Towards Goal(s):  Some progress.   Nutritional Diagnosis:  NB-1.1 Food and nutrition-related knowledge deficit As related to lack of sufficient prior meal planning training and support and self confidence continues to improve  As evidenced by her returning for additional assistance and support with lifestyle behavior change, goal setting.     Intervention:  Nutrition education about sleep hygiene and review about how to cook healthy, make shopping lists, methods of planning meals to increase nutrient value. Coordination of care-  address sleep problems Teaching Method Utilized: Visual,,Auditory,Hands on Handouts given during visit include: AVS Barriers to learning/adherence to lifestyle change: competing values, support, unhealthy food environment Demonstrated degree of understanding via:  Teach Back   Monitoring/Evaluation:  Dietary intake,  exercise, and body weight in 2 week(s) Plyler, Vanessa Palmer, RD 02/12/2017 1:58 PM. .

## 2017-02-12 NOTE — Patient Instructions (Signed)
What we talked about today:   Making a grocery list before shopping Eating oatmeal or cream of wheat instead of eggs/sausage/liver pudding/bologna Yogurt- can mix with fruit Fix Chicken salad How to cook brussel sprouts in the microwave Reasons to get better sleep  The plan:   Continue to keep food record and weigh yourself daily New goal is to weight about 230# Sleep???? What can you do to get better sleep   Call anytime with questions or concerns  Norm ParcelDonna Jammie Palmer Diabetes Educator [phone]

## 2017-02-13 DIAGNOSIS — I10 Essential (primary) hypertension: Secondary | ICD-10-CM | POA: Diagnosis not present

## 2017-02-13 DIAGNOSIS — I2510 Atherosclerotic heart disease of native coronary artery without angina pectoris: Secondary | ICD-10-CM | POA: Diagnosis not present

## 2017-02-13 DIAGNOSIS — E785 Hyperlipidemia, unspecified: Secondary | ICD-10-CM | POA: Diagnosis not present

## 2017-02-16 ENCOUNTER — Emergency Department (HOSPITAL_COMMUNITY)
Admission: EM | Admit: 2017-02-16 | Discharge: 2017-02-17 | Disposition: A | Discharge status: Home / Self Care | Payer: Medicare Other | Attending: Emergency Medicine | Admitting: Emergency Medicine

## 2017-02-16 ENCOUNTER — Emergency Department (HOSPITAL_COMMUNITY): Payer: Medicare Other

## 2017-02-16 ENCOUNTER — Encounter (HOSPITAL_COMMUNITY): Payer: Self-pay | Admitting: Emergency Medicine

## 2017-02-16 ENCOUNTER — Other Ambulatory Visit: Payer: Self-pay

## 2017-02-16 DIAGNOSIS — Z7901 Long term (current) use of anticoagulants: Secondary | ICD-10-CM | POA: Insufficient documentation

## 2017-02-16 DIAGNOSIS — I509 Heart failure, unspecified: Secondary | ICD-10-CM | POA: Diagnosis not present

## 2017-02-16 DIAGNOSIS — Z7982 Long term (current) use of aspirin: Secondary | ICD-10-CM | POA: Diagnosis not present

## 2017-02-16 DIAGNOSIS — I2510 Atherosclerotic heart disease of native coronary artery without angina pectoris: Secondary | ICD-10-CM | POA: Insufficient documentation

## 2017-02-16 DIAGNOSIS — N183 Chronic kidney disease, stage 3 (moderate): Secondary | ICD-10-CM | POA: Insufficient documentation

## 2017-02-16 DIAGNOSIS — Z79899 Other long term (current) drug therapy: Secondary | ICD-10-CM | POA: Insufficient documentation

## 2017-02-16 DIAGNOSIS — I130 Hypertensive heart and chronic kidney disease with heart failure and stage 1 through stage 4 chronic kidney disease, or unspecified chronic kidney disease: Secondary | ICD-10-CM | POA: Diagnosis not present

## 2017-02-16 DIAGNOSIS — R0602 Shortness of breath: Secondary | ICD-10-CM | POA: Diagnosis not present

## 2017-02-16 DIAGNOSIS — J45909 Unspecified asthma, uncomplicated: Secondary | ICD-10-CM | POA: Insufficient documentation

## 2017-02-16 DIAGNOSIS — T7840XA Allergy, unspecified, initial encounter: Secondary | ICD-10-CM

## 2017-02-16 DIAGNOSIS — Z91010 Allergy to peanuts: Secondary | ICD-10-CM | POA: Diagnosis not present

## 2017-02-16 DIAGNOSIS — T783XXA Angioneurotic edema, initial encounter: Secondary | ICD-10-CM | POA: Diagnosis not present

## 2017-02-16 DIAGNOSIS — R9431 Abnormal electrocardiogram [ECG] [EKG]: Secondary | ICD-10-CM | POA: Diagnosis not present

## 2017-02-16 MED ORDER — DIPHENHYDRAMINE HCL 25 MG PO CAPS
25.0000 mg | ORAL_CAPSULE | Freq: Once | ORAL | Status: AC
  Administered 2017-02-16: 25 mg via ORAL
  Filled 2017-02-16: qty 1

## 2017-02-16 MED ORDER — DIPHENHYDRAMINE HCL 50 MG/ML IJ SOLN: 25.0000 mg | Freq: Once | INTRAMUSCULAR | Status: DC

## 2017-02-16 MED ORDER — FAMOTIDINE 20 MG PO TABS
40.0000 mg | ORAL_TABLET | Freq: Once | ORAL | Status: AC
  Administered 2017-02-16: 40 mg via ORAL
  Filled 2017-02-16: qty 2

## 2017-02-16 MED ORDER — PREDNISONE 20 MG PO TABS
60.0000 mg | ORAL_TABLET | Freq: Once | ORAL | Status: AC
  Administered 2017-02-16: 60 mg via ORAL
  Filled 2017-02-16: qty 3

## 2017-02-16 NOTE — ED Triage Notes (Signed)
Per EMS:  Pt presents to ED for SOB after going to eat at a [place] restaurant and having a few wine coolers.  Patient c/o lip swelling, difficulty swallowing, anxiety, and nausea.  Known hx of allergy to peanuts.  Pt given 50mg  BEnadryl and 0.3 IM Epi en route.  Pt more at ease at this time, states her breathing is better, HR WDL at this time.  C/o some central chest burning since the incident.

## 2017-02-16 NOTE — ED Provider Notes (Signed)
[facility] EMERGENCY DEPARTMENT Provider Note   CSN: [id number] Arrival date & time: 02/16/17  2240     History   Chief Complaint Chief Complaint  Patient presents with  . Shortness of Breath    HPI Vanessa Palmer is a 66 y.o. female with a h/o of asthma, angioedema, HTN, and CAD who presents to the Emergency Department with a chief complaint of dyspnea that began tonight. She reports she went out to eat at a [place] restaurant then went to a friend's house for a few alcoholic beverages. She reports she began to feel mildly dizzy around 21:00 with associated dyspnea and "burrning", constant, non-radiating chest discomfort. She reports that she looked in the mirror and noticed her bottom lip was swollen so she called EMS.   She was given 50 mg of Benadrul and 0.3 IM Epi en route, which resolved her dizziness, dyspnea and chest discomfort. She denies throat closing, gasping for air, leg swelling, jaw or arm pain, or rash at this time.   A review of her medical records indicates she was evaluated for similar symptoms in 06/18 that was thought to be secondary to verapamil that she had started recently.   The history is provided by the patient. No language interpreter was used.    Past Medical History:  Diagnosis Date  . ALCOHOL ABUSE 12/13/2005   Annotation: Sober since 11/06 Qualifier: Diagnosis of  By: Wallace Cullens MD, Natalia Leatherwood    . Anemia   . CAD (coronary artery disease)    s/p non-Q wave MI in 06/2001 and 2004, 2005  . CHF (congestive heart failure) (HCC)   . Chronic kidney disease (CKD), stage III (moderate) (HCC) 09/19/2010  . Depression   . DJD (degenerative joint disease)   . DVT of lower extremity, bilateral (HCC) 04/04/2012   On Xarelto    . GERD (gastroesophageal reflux disease)   . Gout   . Headache    migraines  . Hyperlipidemia   . Hypertension   . INTRINSIC ASTHMA, WITH EXACERBATION 09/27/2009   Qualifier: Diagnosis of  By: Denton Meek MD, Tillie Rung    .  Obesity   . OSA (obstructive sleep apnea)    previously used CPAP, has lost  . Seizures (HCC)    As a teenager     Patient Active Problem List   Diagnosis Date Noted  . LVH (left ventricular hypertrophy) 08/15/2016  . Status post total replacement of right hip 11/23/2014  . Osteoarthritis of right hip 10/13/2014  . Nocturnal leg cramps 01/06/2014  . DVT of lower extremity, bilateral (HCC) 04/04/2012  . Routine health maintenance 01/14/2012  . GERD (gastroesophageal reflux disease) 01/09/2012  . Urinary incontinence 12/31/2011  . Chronic kidney disease (CKD), stage III (moderate) (HCC) 09/19/2010  . Chronic pelvic pain in female 02/03/2010  . Asthma, chronic 09/27/2009  . Elevated alkaline phosphatase level 08/01/2009  . Pain in joint, multiple sites 12/21/2008  . MAJOR DPRSV DISORDER RECURRENT EPISODE MODERATE 09/08/2008  . Gout 07/05/2008  . Hyperlipidemia 05/07/2006  . Coronary atherosclerosis 02/05/2006  . Morbid obesity (HCC) 12/13/2005  . History of alcohol abuse 12/13/2005  . SLEEP APNEA, OBSTRUCTIVE 12/13/2005  . Essential hypertension 12/13/2005    Past Surgical History:  Procedure Laterality Date  . CARDIAC CATHETERIZATION     06/2001, 02/2002, 10/2004 (10-15% proximal stenosis of circumflex, diffuse disease of  OM1 and RCA)  . COLONOSCOPY    . KNEE ARTHROSCOPY Left   . PARTIAL HYSTERECTOMY     due to  endometriosis  . TOTAL HIP ARTHROPLASTY Right 11/23/2014   Procedure: RIGHT TOTAL HIP ARTHROPLASTY ANTERIOR APPROACH;  Surgeon: Kathryne Hitchhristopher Y Blackman, MD;  [facility];  Service: Orthopedics;  Laterality: Right;  . TUBAL LIGATION      OB History    No data available       Home Medications    Prior to Admission medications   Medication Sig Start Date End Date Taking? Authorizing Provider  acetaminophen (TYLENOL) 500 MG tablet Take 1,000 mg by mouth every 6 (six) hours as needed for moderate pain.    [provider]  amLODipine (NORVASC) 5 MG  tablet Take 5 mg by mouth daily. 01/21/17   [provider]  aspirin EC 81 MG tablet Take 81 mg by mouth daily.    [provider]  atorvastatin (LIPITOR) 40 MG tablet TAKE 1 TABLET (40 MG TOTAL) BY MOUTH DAILY. 04/10/16   [name], [name], MD  clopidogrel (PLAVIX) 75 MG tablet Take 75 mg by mouth daily.    [provider]  EPINEPHrine 0.3 mg/0.3 mL IJ SOAJ injection Inject 0.3 mLs (0.3 mg total) into the muscle once for 1 dose. 02/17/17 02/17/17  Gladine Plude A, PA-C  fluticasone (FLONASE) 50 MCG/ACT nasal spray INSTILL TWO   SPRAYS IN EACH NOSTRIL ONCE DAILY Patient taking differently: INSTILL TWO   SPRAYS IN EACH NOSTRIL ONCE DAILY AS NEEDED FOR ALLERGIES 11/22/15   [name], [name], MD  fluticasone (FLOVENT HFA) 44 MCG/ACT inhaler Inhale 2 puffs into the lungs 2 (two) times daily. Patient taking differently: Inhale 2 puffs into the lungs 2 (two) times daily as needed (shortness of breath).  06/02/13   McLean-Scocuzza, Pasty Spillersracy N, MD  furosemide (LASIX) 40 MG tablet TAKE 1 TABLET (40 MG TOTAL) BY MOUTH DAILY. 12/01/16   [name], [name], MD  metoprolol succinate (TOPROL-XL) 50 MG 24 hr tablet Take 50 mg by mouth daily. 01/21/17   [provider]  nitroGLYCERIN (NITROSTAT) 0.4 MG SL tablet Place 0.4 mg under the tongue every 5 (five) minutes as needed for chest pain.    [provider]  omeprazole (PRILOSEC) 20 MG capsule Take 1 capsule (20 mg total) by mouth daily. 12/05/16   [name], [name], MD  oxybutynin (DITROPAN) 5 MG tablet Take 1 tablet (5 mg total) by mouth 2 (two) times daily. 11/23/15   [name], [name], MD  oxyCODONE-acetaminophen (PERCOCET) 10-325 MG tablet Take 1 tablet by mouth every 8 (eight) hours as needed for pain. 01/23/17 12/31/17  [name], [name], MD  predniSONE (DELTASONE) 10 MG tablet Take 4 tablets (40 mg total) by mouth daily for 4 days. 02/17/17 02/21/17  Tationa Stech A, PA-C  traZODone (DESYREL) 50 MG tablet Take 1-2 tablets (50-100 mg  total) by mouth at bedtime. 12/05/16   [name], [name], MD  XARELTO 20 MG TABS tablet TAKE 1 TABLET (20 MG TOTAL) BY MOUTH DAILY WITH SUPPER. 06/29/16   [name], [name], MD    Family History Family History  Problem Relation Age of Onset  . Mental illness Mother   . Heart disease Father   . Diabetes Sister   . Diabetes Sister   . Diabetes Sister     Social History Social History   Tobacco Use  . Smoking status: Never Smoker  . Smokeless tobacco: Never Used  Substance Use Topics  . Alcohol use: Yes    Alcohol/week: 1.8 oz    Types: 3 Shots of liquor per week    Comment:  Sometimes - cutting back.  . Drug use: No     Allergies   Ace inhibitors; Lexiscan [regadenoson]; Peanuts [peanut oil]; Hydromorphone hcl; Percocet [oxycodone-acetaminophen]; Propoxyphene n-acetaminophen; and Vicodin [hydrocodone-acetaminophen]   Review of Systems Review of Systems  Constitutional: Negative for activity change, chills and fever.  HENT: Positive for facial swelling and trouble swallowing. Negative for sore throat.   Respiratory: Positive for shortness of breath.   Cardiovascular: Negative for chest pain.  Gastrointestinal: Negative for abdominal pain, diarrhea, nausea and vomiting.  Musculoskeletal: Negative for back pain and neck pain.  Skin: Negative for color change, rash and wound.  Neurological: Negative for dizziness.   Physical Exam Updated Vital Signs SpO2 98%   Physical Exam  Constitutional: No distress.  HENT:  Head: Normocephalic.  Angioedema noted to the inferior labia.  No edema noted to the superior labia.  No swelling noted to the tongue.  Eyes: Conjunctivae are normal.  Neck: Normal range of motion. Neck supple.  Cardiovascular: Normal rate, regular rhythm, normal heart sounds and intact distal pulses. Exam reveals no gallop and no friction rub.  No murmur heard. Pulmonary/Chest: Effort normal and breath sounds normal. No stridor. No respiratory distress. She has no  wheezes. She has no rales. She exhibits no tenderness.  Gasping for air, choking, respiratory distress.  She is speaking in complete sentences.  No conversational dyspnea.  Abdominal: Soft. She exhibits no distension and no mass. There is no tenderness. There is no rebound and no guarding. No hernia.  Neurological: She is alert.  Skin: Skin is warm. No rash noted. She is not diaphoretic.  Psychiatric: Her behavior is normal.  Nursing note and vitals reviewed.   ED Treatments / Results  Labs (all labs ordered are listed, but only abnormal results are displayed) Labs Reviewed - No data to display  EKG  EKG Interpretation None       Radiology Dg Chest 2 View  Result Date: 02/17/2017 CLINICAL DATA:  Acute onset of generalized chest tightness and shortness of breath. Lip swelling and difficulty swallowing. Nausea. EXAM: CHEST  2 VIEW COMPARISON:  Chest radiograph performed 05/03/2016 FINDINGS: The lungs are well-aerated and clear. There is no evidence of focal opacification, pleural effusion or pneumothorax. The heart is normal in size; the mediastinal contour is within normal limits. No acute osseous abnormalities are seen. IMPRESSION: No acute cardiopulmonary process seen. Electronically Signed   By: Roanna Raider M.D.   On: 02/17/2017 00:06    Procedures Procedures (including critical care time)  Medications Ordered in ED Medications  diphenhydrAMINE (BENADRYL) capsule 25 mg (25 mg Oral Given 02/16/17 2321)  predniSONE (DELTASONE) tablet 60 mg (60 mg Oral Given 02/16/17 2322)  famotidine (PEPCID) tablet 40 mg (40 mg Oral Given 02/16/17 2322)     Initial Impression / Assessment and Plan / ED Course  I have reviewed the triage vital signs and the nursing notes.  Pertinent labs & imaging results that were available during my care of the patient were reviewed by me and considered in my medical decision making (see chart for details).  Clinical Course as of Feb 17 122  Wynelle Link Feb 17, 2017  1610 Patient recheck: She has no complaints. She is sleeping comfortably. Angioedema has significantly improved. SaO2 98%. NAD.   [MM]    Clinical Course User Index [MM] Cherylee Rawlinson A, PA-C    66 year old female with a history of asthma and angioedema presenting to the emergency department with angioedema and allergic reaction.  She was  given epinephrine and Benadryl in route with improvement of her dyspnea.  On physical exam, no respiratory distress.  No swelling to the tongue.  She does have angioedema to the lower lip.  Pepcid, Benadryl, and prednisone given in the ED.  On reevaluation, swelling in her lower lip is significantly improved.  She is sleeping, easily arousable, and has no complaints at this time.  We will observe the patient for 4 hours in the ED prior to discharge.  Will plan to discharge the patient with an EpiPen and burst of prednisone.  Encouraged her to follow up with her PCP when they reopen. She is scheduled for a heart cath on 12/27. Encouraged her to follow up with the pre-admission team to let them know about her visit tonight. [facility] at the end of my shift. Patient presentation, ED course, and plan of care discussed with review of all pertinent labs and imaging. Please see his/her note for further details regarding further ED course and disposition.  Final Clinical Impressions(s) / ED Diagnoses   Final diagnoses:  Angioedema, initial encounter  Allergic reaction, initial encounter    ED Discharge Orders        Ordered    EPINEPHrine 0.3 mg/0.3 mL IJ SOAJ injection   Once     02/17/17 0047    predniSONE (DELTASONE) 10 MG tablet  Daily     02/17/17 0051       Barkley BoardsMcDonald, Vaishnavi Dalby A, PA-C 02/17/17 0123    Tegeler, Canary Brimhristopher J, MD 02/17/17 1048

## 2017-02-16 NOTE — ED Notes (Signed)
Pt. To XRAY via stretcher. 

## 2017-02-17 MED ORDER — PREDNISONE 10 MG PO TABS: 40.0000 mg | ORAL_TABLET | Freq: Every day | ORAL | 0 refills | Status: DC

## 2017-02-17 MED ORDER — EPINEPHRINE 0.3 MG/0.3ML IJ SOAJ: 0.3000 mg | Freq: Once | INTRAMUSCULAR | 0 refills | Status: AC

## 2017-02-17 NOTE — ED Provider Notes (Signed)
3:48 AM Patient has been monitored in the emergency department without acute decompensation.  She has clear speech and has ambulated to the bathroom without difficulty.  No current concern for rebound reaction.  Patient discharged with supportive care instructions.  Return precautions provided at discharge.  Patient discharged in stable condition with no unaddressed concerns.   Antony MaduraHumes, Tamy Accardo, PA-C 02/17/17 [id number]    Shon BatonHorton, Courtney F, MD 02/17/17 [phone]

## 2017-02-17 NOTE — Discharge Instructions (Signed)
Take 40 mg (2 tablets) prednisone daily, starting tomorrow, for the next 4 days.    Since you are scheduled for a heart catheterization on 12/27, please call and let them know that you were seen tonight in the ED to make sure this will not impact your upcoming procedure.   I have also given you a prescription for an EpiPen you can fill. WHEN TO USE AN EPINEPHRINE AUTOINJECTOR -- A person who is having an allergic reaction should use his/her epinephrine autoinjector immediately if he/she: ?Is having trouble breathing. ?Feels tightness in the throat. ?Feels lightheaded or thinks that he/she might pass out.  Please call and schedule a follow up appointment when your PCP re-opens after the holiday.   If you use the EpiPen, you need to come to the Emergency Department for evaluation. If you develop any new shortness of breath, throat closing, swelling to the lips, tongue or other areas, please return to the emergency department for re-evaluation.

## 2017-02-17 NOTE — ED Notes (Signed)
Pt. Wheeled to bathroom. Pt. Able to get up and onto toilet with stable gait.

## 2017-02-21 ENCOUNTER — Encounter (HOSPITAL_COMMUNITY)
Admission: RE | Disposition: A | Discharge status: Home / Self Care | Payer: Self-pay | Source: Ambulatory Visit | Attending: Cardiology

## 2017-02-21 ENCOUNTER — Observation Stay (HOSPITAL_COMMUNITY)
Admission: RE | Admit: 2017-02-21 | Discharge: 2017-02-22 | Disposition: A | Discharge status: Home / Self Care | Payer: Medicare Other | Source: Ambulatory Visit | Attending: Cardiology | Admitting: Cardiology

## 2017-02-21 ENCOUNTER — Other Ambulatory Visit: Payer: Self-pay

## 2017-02-21 ENCOUNTER — Encounter (HOSPITAL_COMMUNITY): Payer: Self-pay | Admitting: Cardiology

## 2017-02-21 DIAGNOSIS — Z79899 Other long term (current) drug therapy: Secondary | ICD-10-CM | POA: Diagnosis not present

## 2017-02-21 DIAGNOSIS — Z7982 Long term (current) use of aspirin: Secondary | ICD-10-CM | POA: Diagnosis not present

## 2017-02-21 DIAGNOSIS — I25118 Atherosclerotic heart disease of native coronary artery with other forms of angina pectoris: Principal | ICD-10-CM | POA: Insufficient documentation

## 2017-02-21 DIAGNOSIS — I252 Old myocardial infarction: Secondary | ICD-10-CM | POA: Insufficient documentation

## 2017-02-21 DIAGNOSIS — M1990 Unspecified osteoarthritis, unspecified site: Secondary | ICD-10-CM | POA: Insufficient documentation

## 2017-02-21 DIAGNOSIS — R9439 Abnormal result of other cardiovascular function study: Secondary | ICD-10-CM | POA: Diagnosis not present

## 2017-02-21 DIAGNOSIS — I208 Other forms of angina pectoris: Secondary | ICD-10-CM | POA: Diagnosis present

## 2017-02-21 DIAGNOSIS — I129 Hypertensive chronic kidney disease with stage 1 through stage 4 chronic kidney disease, or unspecified chronic kidney disease: Secondary | ICD-10-CM | POA: Diagnosis not present

## 2017-02-21 DIAGNOSIS — I2089 Other forms of angina pectoris: Secondary | ICD-10-CM | POA: Diagnosis present

## 2017-02-21 DIAGNOSIS — Z7951 Long term (current) use of inhaled steroids: Secondary | ICD-10-CM | POA: Diagnosis not present

## 2017-02-21 DIAGNOSIS — N189 Chronic kidney disease, unspecified: Secondary | ICD-10-CM | POA: Insufficient documentation

## 2017-02-21 DIAGNOSIS — E785 Hyperlipidemia, unspecified: Secondary | ICD-10-CM | POA: Insufficient documentation

## 2017-02-21 DIAGNOSIS — I25110 Atherosclerotic heart disease of native coronary artery with unstable angina pectoris: Secondary | ICD-10-CM | POA: Diagnosis not present

## 2017-02-21 DIAGNOSIS — Z6841 Body Mass Index (BMI) 40.0 and over, adult: Secondary | ICD-10-CM | POA: Diagnosis not present

## 2017-02-21 DIAGNOSIS — I209 Angina pectoris, unspecified: Secondary | ICD-10-CM | POA: Diagnosis present

## 2017-02-21 DIAGNOSIS — Z86718 Personal history of other venous thrombosis and embolism: Secondary | ICD-10-CM | POA: Insufficient documentation

## 2017-02-21 HISTORY — DX: Unspecified osteoarthritis, unspecified site: M19.90

## 2017-02-21 HISTORY — DX: Dorsalgia, unspecified: M54.9

## 2017-02-21 HISTORY — DX: Acute embolism and thrombosis of unspecified deep veins of unspecified lower extremity: I82.409

## 2017-02-21 HISTORY — DX: Other chronic pain: G89.29

## 2017-02-21 HISTORY — DX: Migraine, unspecified, not intractable, without status migrainosus: G43.909

## 2017-02-21 HISTORY — DX: Acute myocardial infarction, unspecified: I21.9

## 2017-02-21 HISTORY — DX: Pain in thoracic spine: M54.6

## 2017-02-21 HISTORY — PX: LEFT HEART CATH AND CORONARY ANGIOGRAPHY: CATH118249

## 2017-02-21 HISTORY — PX: CARDIAC CATHETERIZATION: SHX172

## 2017-02-21 SURGERY — LEFT HEART CATH AND CORONARY ANGIOGRAPHY
Anesthesia: LOCAL

## 2017-02-21 MED ORDER — SODIUM CHLORIDE 0.9 % IV SOLN: 250.0000 mL | INTRAVENOUS | Status: DC | PRN

## 2017-02-21 MED ORDER — NITROGLYCERIN 0.4 MG SL SUBL
SUBLINGUAL_TABLET | SUBLINGUAL | Status: DC | PRN
  Administered 2017-02-21: .4 mg via SUBLINGUAL

## 2017-02-21 MED ORDER — TRAZODONE HCL 50 MG PO TABS: 50.0000 mg | ORAL_TABLET | Freq: Every evening | ORAL | Status: DC | PRN

## 2017-02-21 MED ORDER — LIDOCAINE HCL (PF) 1 % IJ SOLN
INTRAMUSCULAR | Status: DC | PRN
  Administered 2017-02-21: 16 mL

## 2017-02-21 MED ORDER — CLOPIDOGREL BISULFATE 75 MG PO TABS
75.0000 mg | ORAL_TABLET | Freq: Once | ORAL | Status: AC
  Administered 2017-02-21: 75 mg via ORAL

## 2017-02-21 MED ORDER — SODIUM CHLORIDE 0.9 % IV SOLN: INTRAVENOUS | Status: AC

## 2017-02-21 MED ORDER — ASPIRIN EC 81 MG PO TBEC
81.0000 mg | DELAYED_RELEASE_TABLET | Freq: Every day | ORAL | Status: DC
  Administered 2017-02-22: 81 mg via ORAL
  Filled 2017-02-21: qty 1

## 2017-02-21 MED ORDER — MIDAZOLAM HCL 2 MG/2ML IJ SOLN
INTRAMUSCULAR | Status: AC
  Filled 2017-02-21: qty 2

## 2017-02-21 MED ORDER — FENTANYL CITRATE (PF) 100 MCG/2ML IJ SOLN
INTRAMUSCULAR | Status: DC | PRN
  Administered 2017-02-21 (×2): 25 ug via INTRAVENOUS

## 2017-02-21 MED ORDER — HEPARIN (PORCINE) IN NACL 2-0.9 UNIT/ML-% IJ SOLN
INTRAMUSCULAR | Status: AC | PRN
  Administered 2017-02-21: 1000 mL

## 2017-02-21 MED ORDER — SODIUM CHLORIDE 0.9% FLUSH: 3.0000 mL | Freq: Two times a day (BID) | INTRAVENOUS | Status: DC

## 2017-02-21 MED ORDER — DIPHENHYDRAMINE HCL 50 MG/ML IJ SOLN
25.0000 mg | Freq: Once | INTRAMUSCULAR | Status: AC
  Administered 2017-02-21: 25 mg via INTRAVENOUS

## 2017-02-21 MED ORDER — FENTANYL CITRATE (PF) 100 MCG/2ML IJ SOLN
INTRAMUSCULAR | Status: AC
  Filled 2017-02-21: qty 2

## 2017-02-21 MED ORDER — CLOPIDOGREL BISULFATE 75 MG PO TABS
ORAL_TABLET | ORAL | Status: AC
  Filled 2017-02-21: qty 1

## 2017-02-21 MED ORDER — FLUTICASONE PROPIONATE 50 MCG/ACT NA SUSP
1.0000 | Freq: Every day | NASAL | Status: DC
  Administered 2017-02-21: 1 via NASAL
  Filled 2017-02-21: qty 16

## 2017-02-21 MED ORDER — METOPROLOL SUCCINATE ER 50 MG PO TB24
50.0000 mg | ORAL_TABLET | Freq: Every day | ORAL | Status: DC
  Administered 2017-02-21 – 2017-02-22 (×2): 50 mg via ORAL
  Filled 2017-02-21 (×2): qty 1

## 2017-02-21 MED ORDER — NITROGLYCERIN 0.4 MG SL SUBL
SUBLINGUAL_TABLET | SUBLINGUAL | Status: AC
  Filled 2017-02-21: qty 1

## 2017-02-21 MED ORDER — LIDOCAINE HCL (PF) 1 % IJ SOLN
INTRAMUSCULAR | Status: AC
  Filled 2017-02-21: qty 30

## 2017-02-21 MED ORDER — OXYBUTYNIN CHLORIDE 5 MG PO TABS
5.0000 mg | ORAL_TABLET | Freq: Two times a day (BID) | ORAL | Status: DC
  Administered 2017-02-21 – 2017-02-22 (×3): 5 mg via ORAL
  Filled 2017-02-21 (×3): qty 1

## 2017-02-21 MED ORDER — TRAZODONE HCL 50 MG PO TABS
50.0000 mg | ORAL_TABLET | Freq: Every day | ORAL | Status: DC
  Administered 2017-02-21: 50 mg via ORAL
  Filled 2017-02-21: qty 1

## 2017-02-21 MED ORDER — SODIUM CHLORIDE 0.9% FLUSH
3.0000 mL | Freq: Two times a day (BID) | INTRAVENOUS | Status: DC
  Administered 2017-02-21: 3 mL via INTRAVENOUS

## 2017-02-21 MED ORDER — SODIUM CHLORIDE 0.9% FLUSH: 3.0000 mL | INTRAVENOUS | Status: DC | PRN

## 2017-02-21 MED ORDER — ASPIRIN 81 MG PO CHEW
CHEWABLE_TABLET | ORAL | Status: AC
  Filled 2017-02-21: qty 1

## 2017-02-21 MED ORDER — HEPARIN (PORCINE) IN NACL 2-0.9 UNIT/ML-% IJ SOLN
INTRAMUSCULAR | Status: AC
  Filled 2017-02-21: qty 1000

## 2017-02-21 MED ORDER — ACETAMINOPHEN 325 MG PO TABS
650.0000 mg | ORAL_TABLET | Freq: Four times a day (QID) | ORAL | Status: DC | PRN
  Administered 2017-02-21: 650 mg via ORAL
  Filled 2017-02-21: qty 2

## 2017-02-21 MED ORDER — SODIUM CHLORIDE 0.9 % WEIGHT BASED INFUSION: 1.0000 mL/kg/h | INTRAVENOUS | Status: DC

## 2017-02-21 MED ORDER — IOPAMIDOL (ISOVUE-370) INJECTION 76%
INTRAVENOUS | Status: AC
  Filled 2017-02-21: qty 100

## 2017-02-21 MED ORDER — NITROGLYCERIN 0.4 MG SL SUBL: 0.4000 mg | SUBLINGUAL_TABLET | SUBLINGUAL | Status: DC | PRN

## 2017-02-21 MED ORDER — PREDNISONE 20 MG PO TABS
40.0000 mg | ORAL_TABLET | Freq: Every day | ORAL | Status: DC
  Administered 2017-02-21 – 2017-02-22 (×2): 40 mg via ORAL
  Filled 2017-02-21 (×2): qty 2

## 2017-02-21 MED ORDER — SODIUM CHLORIDE 0.9 % IV SOLN
INTRAVENOUS | Status: DC
  Administered 2017-02-21: 14:00:00 via INTRAVENOUS

## 2017-02-21 MED ORDER — ASPIRIN 81 MG PO CHEW
81.0000 mg | CHEWABLE_TABLET | ORAL | Status: AC
  Administered 2017-02-21: 81 mg via ORAL

## 2017-02-21 MED ORDER — SODIUM CHLORIDE 0.9 % WEIGHT BASED INFUSION
3.0000 mL/kg/h | INTRAVENOUS | Status: DC
  Administered 2017-02-21: 3 mL/kg/h via INTRAVENOUS

## 2017-02-21 MED ORDER — IOPAMIDOL (ISOVUE-370) INJECTION 76%
INTRAVENOUS | Status: DC | PRN
  Administered 2017-02-21: 95 mL via INTRA_ARTERIAL

## 2017-02-21 MED ORDER — AMLODIPINE BESYLATE 5 MG PO TABS
5.0000 mg | ORAL_TABLET | Freq: Every day | ORAL | Status: DC
  Administered 2017-02-21 – 2017-02-22 (×2): 5 mg via ORAL
  Filled 2017-02-21 (×2): qty 1

## 2017-02-21 MED ORDER — ATORVASTATIN CALCIUM 40 MG PO TABS
40.0000 mg | ORAL_TABLET | Freq: Every day | ORAL | Status: DC
  Administered 2017-02-21: 40 mg via ORAL
  Filled 2017-02-21: qty 1

## 2017-02-21 MED ORDER — MIDAZOLAM HCL 2 MG/2ML IJ SOLN
INTRAMUSCULAR | Status: DC | PRN
  Administered 2017-02-21 (×2): 1 mg via INTRAVENOUS

## 2017-02-21 MED ORDER — DIPHENHYDRAMINE HCL 50 MG/ML IJ SOLN
INTRAMUSCULAR | Status: AC
  Filled 2017-02-21: qty 1

## 2017-02-21 MED ORDER — BUDESONIDE 0.25 MG/2ML IN SUSP
0.2500 mg | Freq: Two times a day (BID) | RESPIRATORY_TRACT | Status: DC
  Administered 2017-02-21 – 2017-02-22 (×2): 0.25 mg via RESPIRATORY_TRACT
  Filled 2017-02-21 (×2): qty 2

## 2017-02-21 MED ORDER — ONDANSETRON HCL 4 MG/2ML IJ SOLN: 4.0000 mg | Freq: Four times a day (QID) | INTRAMUSCULAR | Status: DC | PRN

## 2017-02-21 MED ORDER — PANTOPRAZOLE SODIUM 40 MG PO TBEC
40.0000 mg | DELAYED_RELEASE_TABLET | Freq: Every day | ORAL | Status: DC
  Administered 2017-02-21 – 2017-02-22 (×2): 40 mg via ORAL
  Filled 2017-02-21 (×2): qty 1

## 2017-02-21 SURGICAL SUPPLY — 11 items
CATH INFINITI 5 FR 3DRC (CATHETERS) ×2 IMPLANT
CATH INFINITI 5 FR STR PIGTAIL (CATHETERS) ×2 IMPLANT
CATH INFINITI 5FR JL5 (CATHETERS) ×2 IMPLANT
CATH INFINITI 5FR MULTPACK ANG (CATHETERS) ×2 IMPLANT
KIT HEART LEFT (KITS) ×2 IMPLANT
PACK CARDIAC CATHETERIZATION (CUSTOM PROCEDURE TRAY) ×2 IMPLANT
SHEATH PINNACLE 5F 10CM (SHEATH) ×2 IMPLANT
SYR MEDRAD MARK V 150ML (SYRINGE) ×2 IMPLANT
TRANSDUCER W/STOPCOCK (MISCELLANEOUS) ×2 IMPLANT
WIRE EMERALD 3MM-J .035X150CM (WIRE) ×2 IMPLANT
WIRE HITORQ VERSACORE ST 145CM (WIRE) ×2 IMPLANT

## 2017-02-21 NOTE — H&P (Signed)
Printed H&P in the chart needs to be scanned 

## 2017-02-21 NOTE — Progress Notes (Signed)
Site area: rt groin Site Prior to Removal:  Level 0 Pressure Applied For: 20 minutes Manual:   yes Patient Status During Pull:  stable Post Pull Site:  Level 0 Post Pull Instructions Given:  yes Post Pull Pulses Present: palpable Dressing Applied:  Gauze and tegaderm Bedrest begins @ 0910 Comments:

## 2017-02-21 NOTE — Interval H&P Note (Signed)
Cath Lab Visit (complete for each Cath Lab visit)  Clinical Evaluation Leading to the Procedure:   ACS: No.  Non-ACS:    Anginal Classification: CCS III  Anti-ischemic medical therapy: Maximal Therapy (2 or more classes of medications)  Non-Invasive Test Results: Intermediate-risk stress test findings: cardiac mortality 1-3%/year  Prior CABG: No previous CABG      History and Physical Interval Note:  02/21/2017 7:22 AM  Vanessa Palmer  has presented today for surgery, with the diagnosis of abnormal stress test - cp  The various methods of treatment have been discussed with the patient and family. After consideration of risks, benefits and other options for treatment, the patient has consented to  Procedure(s): LEFT HEART CATH AND CORONARY ANGIOGRAPHY (N/A) as a surgical intervention .  The patient's history has been reviewed, patient examined, no change in status, stable for surgery.  I have reviewed the patient's chart and labs.  Questions were answered to the patient's satisfaction.     Rinaldo CloudHarwani, Vanessa Palmer

## 2017-02-21 NOTE — Progress Notes (Signed)
Rt outer eyebrow swollen. No rash, no itching, no oral swelling. Dr. Mohammed KindleHarawini notified

## 2017-02-22 DIAGNOSIS — Z7951 Long term (current) use of inhaled steroids: Secondary | ICD-10-CM | POA: Diagnosis not present

## 2017-02-22 DIAGNOSIS — I25110 Atherosclerotic heart disease of native coronary artery with unstable angina pectoris: Secondary | ICD-10-CM | POA: Diagnosis not present

## 2017-02-22 DIAGNOSIS — I25118 Atherosclerotic heart disease of native coronary artery with other forms of angina pectoris: Secondary | ICD-10-CM | POA: Diagnosis not present

## 2017-02-22 DIAGNOSIS — Z86718 Personal history of other venous thrombosis and embolism: Secondary | ICD-10-CM | POA: Diagnosis not present

## 2017-02-22 DIAGNOSIS — I129 Hypertensive chronic kidney disease with stage 1 through stage 4 chronic kidney disease, or unspecified chronic kidney disease: Secondary | ICD-10-CM | POA: Diagnosis not present

## 2017-02-22 DIAGNOSIS — Z7982 Long term (current) use of aspirin: Secondary | ICD-10-CM | POA: Diagnosis not present

## 2017-02-22 DIAGNOSIS — I252 Old myocardial infarction: Secondary | ICD-10-CM | POA: Diagnosis not present

## 2017-02-22 DIAGNOSIS — Z79899 Other long term (current) drug therapy: Secondary | ICD-10-CM | POA: Diagnosis not present

## 2017-02-22 DIAGNOSIS — M1990 Unspecified osteoarthritis, unspecified site: Secondary | ICD-10-CM | POA: Diagnosis not present

## 2017-02-22 DIAGNOSIS — R9439 Abnormal result of other cardiovascular function study: Secondary | ICD-10-CM | POA: Diagnosis not present

## 2017-02-22 DIAGNOSIS — N189 Chronic kidney disease, unspecified: Secondary | ICD-10-CM | POA: Diagnosis not present

## 2017-02-22 DIAGNOSIS — E785 Hyperlipidemia, unspecified: Secondary | ICD-10-CM | POA: Diagnosis not present

## 2017-02-22 LAB — HIV ANTIBODY (ROUTINE TESTING W REFLEX): HIV Screen 4th Generation wRfx: NONREACTIVE

## 2017-02-22 NOTE — Discharge Summary (Signed)
Discharge summary dictated on 12/28/2018dictation number is [phone]

## 2017-02-22 NOTE — Discharge Summary (Signed)
NAMElliot Gault:  [place], [place]               ACCOUNT NO.:  [id number]  MEDICAL RECORD NO.:  [id number]  LOCATION:                                 FACILITY:  PHYSICIAN:  Hayzlee Mcsorley N. Sharyn LullHarwani, M.D. DATE OF BIRTH:  09-20-1950  DATE OF ADMISSION:  02/21/2017 DATE OF DISCHARGE:  02/22/2017                              DISCHARGE SUMMARY   ADMITTING DIAGNOSES: 1. Accelerated angina, abnormal nuclear stress test, rule out     progression of coronary artery disease. 2. Chronic kidney disease. 3. Coronary artery disease. 4. History of non-Q-wave myocardial infarction in the remote past. 5. Hypertension. 6. Hyperlipidemia. 7. Morbid obesity. 8. Osteoarthritis. 9. History of tobacco abuse. 10.History of bilateral DVT in the past.  DISCHARGE DIAGNOSES: 1. Stable angina, status post left cardiac catheterization. 2. Chronic kidney disease, stable. 3. Coronary artery disease. 4. History of non-Q-wave myocardial infarction in the remote past. 5. Hypertension. 6. Hyperlipidemia. 7. Morbid obesity. 8. Osteoarthritis. 9. Tobacco abuse. 10.History of bilateral DVT in the past.  DISCHARGE HOME MEDICATIONS: 1. Amlodipine 5 mg daily. 2. Aspirin 81 mg daily. 3. Atorvastatin 40 mg daily. 4. Lasix 40 mg daily. 5. Metoprolol succinate 50 mg daily. 6. Nitrostat sublingual p.r.n. 7. Omeprazole 20 mg daily. 8. Oxybutynin 5 mg twice daily. 9. Trazodone 50 mg daily. 10.Flovent 2 puffs twice daily as needed. 11.Flonase two sprays in each nostril as needed. 12.The patient has been advised to stop oxycodone, prednisone, and     acetaminophen.  DIET:  Low-salt, low-cholesterol, weight-reducing diet.  The patient has been advised to avoid NSAIDs.  CONDITION AT DISCHARGE:  Stable.  FOLLOWUP:  Follow up with me in 1 week.  DISCHARGE INSTRUCTIONS:  Post cardiac cath instructions have been given.  FOLLOWUP:  Follow up with me in 1 week.  CONDITION AT DISCHARGE:  Stable.  BRIEF HISTORY AND HOSPITAL COURSE:   [name] is a 66 year old female with past medical history significant for coronary artery disease, history of non-Q-wave myocardial infarction in the remote past, hypertension, hyperlipidemia, morbid obesity, chronic kidney disease stage II, degenerative joint disease, history of DVT in remote past. She came to the office complaining of retrosternal chest pain described as pressure, radiating to the left arm, lasting 10-15 minutes with minimal exertion and relieves with rest.  The patient denies any nausea, vomiting, or diaphoresis.  Denies any shortness of breath, although her activity is limited.  Denies PND, orthopnea, or leg swelling.  The patient underwent Lexiscan Myoview on February 08, 2017, which showed reversible ischemia in the anterior wall near the apex with EF of 63%. Noninvasive risk stratification intermediate.  PHYSICAL EXAMINATION:  GENERAL:  She was alert, awake, oriented x3. VITAL SIGNS:  Blood pressure was 138/90, pulse 60, regular. HEENT:  Conjunctivae were pink. NECK:  Supple.  No JVD.  No bruit. LUNGS:  Clear to auscultation without rhonchi or rales. CARDIOVASCULAR:  S1, S2 was normal.  There was soft systolic murmur. ABDOMEN:  Soft.  Bowel sounds present.  Nontender. EXTREMITIES:  There is no clubbing, cyanosis, or edema.  BRIEF HOSPITAL COURSE:  The patient was a.m. admit and underwent left cardiac catheterization with selective left and right coronary angiography and LV-graphy as  per procedure report.  The patient tolerated the procedure well.  Post procedure, the patient did not have any chest pain.  During the hospital stay, the patient has no family member to stay with her, neither she has a ride to go home, so the patient was admitted to the hospital overnight.  Her groin is stable. The patient is ambulating in room without any problems.  She will be discharged home on above medications and will be followed up in my office in 1 week.     Eduardo OsierMohan  N. Sharyn LullHarwani, M.D.     MNH/MEDQ  D:  02/22/2017  T:  02/22/2017  Job:  [id number]

## 2017-02-22 NOTE — Discharge Instructions (Signed)
Coronary Angiogram  A coronary angiogram is an X-ray procedure that is used to examine the arteries in the heart. In this procedure, a dye (contrast dye) is injected through a long, thin tube (catheter). The catheter is inserted through the groin, wrist, or arm. The dye is injected into each artery, then X-rays are taken to show if there is a blockage in the arteries of the heart. This procedure can also show if you have valve disease or a disease of the aorta, and it can be used to check the overall function of your heart muscle. You may have a coronary angiogram if:   You are having chest pain, or other symptoms of angina, and you are at risk for heart disease.   You have an abnormal electrocardiogram (ECG) or stress test.   You have chest pain and heart failure.   You are having irregular heart rhythms.   You and your health care provider determine that the benefits of the test information outweigh the risks of the procedure.    Let your health care provider know about:   Any allergies you have, including allergies to contrast dye.   All medicines you are taking, including vitamins, herbs, eye drops, creams, and over-the-counter medicines.   Any problems you or family members have had with anesthetic medicines.   Any blood disorders you have.   Any surgeries you have had.   History of kidney problems or kidney failure.   Any medical conditions you have.   Whether you are pregnant or may be pregnant.  What are the risks?  Generally, this is a safe procedure. However, problems may occur, including:   Infection.   Allergic reaction to medicines or dyes that are used.   Bleeding from the access site or other locations.   Kidney injury, especially in people with impaired kidney function.   Stroke (rare).   Heart attack (rare).   Damage to other structures or organs.    What happens before the procedure?  Staying hydrated  Follow instructions from your health care provider about hydration, which may  include:   Up to 2 hours before the procedure - you may continue to drink clear liquids, such as water, clear fruit juice, black coffee, and plain tea.    Eating and drinking restrictions  Follow instructions from your health care provider about eating and drinking, which may include:   8 hours before the procedure - stop eating heavy meals or foods such as meat, fried foods, or fatty foods.   6 hours before the procedure - stop eating light meals or foods, such as toast or cereal.   2 hours before the procedure - stop drinking clear liquids.    General instructions   Ask your health care provider about:  ? Changing or stopping your regular medicines. This is especially important if you are taking diabetes medicines or blood thinners.  ? Taking medicines such as ibuprofen. These medicines can thin your blood. Do not take these medicines before your procedure if your health care provider instructs you not to, though aspirin may be recommended prior to coronary angiograms.   Plan to have someone take you home from the hospital or clinic.   You may need to have blood tests or X-rays done.  What happens during the procedure?   An IV tube will be inserted into one of your veins.   You will be given one or more of the following:  ? A medicine to help you   relax (sedative).  ? A medicine to numb the area where the catheter will be inserted into an artery (local anesthetic).   To reduce your risk of infection:  ? Your health care team will wash or sanitize their hands.  ? Your skin will be washed with soap.  ? Hair may be removed from the area where the catheter will be inserted.   You will be connected to a continuous ECG monitor.   The catheter will be inserted into an artery. The location may be in your groin, in your wrist, or in the fold of your arm (near your elbow).   A type of X-ray (fluoroscopy) will be used to help guide the catheter to the opening of the blood vessel that is being examined.   A dye  will be injected into the catheter, and X-rays will be taken. The dye will help to show where any narrowing or blockages are located in the heart arteries.   Tell your health care provider if you have any chest pain or trouble breathing during the procedure.   If blockages are found, your health care provider may perform another procedure, such as inserting a coronary stent.  The procedure may vary among health care providers and hospitals.  What happens after the procedure?   After the procedure, you will need to keep the area still for a few hours, or for as long as told by your health care provider. If the procedure is done through the groin, you will be instructed to not bend and not cross your legs.   The insertion site will be checked frequently.   The pulse in your foot or wrist will be checked frequently.   You may have additional blood tests, X-rays, and a test that records the electrical activity of your heart (ECG).   Do not drive for 24 hours if you were given a sedative.  Summary   A coronary angiogram is an X-ray procedure that is used to look into the arteries in the heart.   During the procedure, a dye (contrast dye) is injected through a long, thin tube (catheter). The catheter is inserted through the groin, wrist, or arm.   Tell your health care provider about any allergies you have, including allergies to contrast dye.   After the procedure, you will need to keep the area still for a few hours, or for as long as told by your health care provider.  This information is not intended to replace advice given to you by your health care provider. Make sure you discuss any questions you have with your health care provider.  Document Released: 08/19/2002 Document Revised: 11/25/2015 Document Reviewed: 11/25/2015  Elsevier Interactive Patient Education  2018 [employer]

## 2017-03-01 ENCOUNTER — Ambulatory Visit: Payer: Self-pay | Admitting: Dietician

## 2017-03-08 ENCOUNTER — Encounter: Payer: Self-pay | Admitting: Dietician

## 2017-03-08 ENCOUNTER — Ambulatory Visit (INDEPENDENT_AMBULATORY_CARE_PROVIDER_SITE_OTHER): Payer: Medicare Other | Admitting: Dietician

## 2017-03-08 DIAGNOSIS — N289 Disorder of kidney and ureter, unspecified: Secondary | ICD-10-CM

## 2017-03-08 DIAGNOSIS — J45901 Unspecified asthma with (acute) exacerbation: Secondary | ICD-10-CM | POA: Diagnosis not present

## 2017-03-08 DIAGNOSIS — E669 Obesity, unspecified: Secondary | ICD-10-CM

## 2017-03-08 DIAGNOSIS — Z6841 Body Mass Index (BMI) 40.0 and over, adult: Secondary | ICD-10-CM

## 2017-03-08 DIAGNOSIS — D649 Anemia, unspecified: Secondary | ICD-10-CM

## 2017-03-08 DIAGNOSIS — M1990 Unspecified osteoarthritis, unspecified site: Secondary | ICD-10-CM | POA: Diagnosis not present

## 2017-03-08 DIAGNOSIS — Z713 Dietary counseling and surveillance: Secondary | ICD-10-CM | POA: Diagnosis not present

## 2017-03-08 DIAGNOSIS — E1129 Type 2 diabetes mellitus with other diabetic kidney complication: Secondary | ICD-10-CM

## 2017-03-08 DIAGNOSIS — R609 Edema, unspecified: Secondary | ICD-10-CM | POA: Diagnosis not present

## 2017-03-08 NOTE — Progress Notes (Signed)
  Medical Nutrition Therapy:  Appt start time: 1015 PM   end time:  1055 PM. Visit #9  Assessment:  Primary concerns today: weight loss and healthy eating  for kidney disease, anemia. [name] kept food records for the past 2 weeks- she say this is helping her eat more balanced meals and mind what she is eating.  She took the list to the grocery and says this helped her. She weighed at home and recorded her weight and has lost 1/2 more pound.  Diet record shows more vegetables but still high in  Protein and processed carbs.  She is limited by lack of material resources. She accessed our pantry today.  Her homework is to assess her pantry to assist with her next  grocery shopping list.     ATHROPOMETRICS: weight-239.4#. BMI- 43.79 obese class III- new weight goal 230# SLEEP: reports her sleep is good today  Activity: ADLS, thinking about joining an exercise place .  Estimated energy needs: 1500-1600 for gradual loss 180- 200g carbohydrates 78 grams protein/day   Progress Towards Goal(s):  Some progress.   Nutritional Diagnosis:  NB-1.1 Food and nutrition-related knowledge deficit As related to lack of sufficient prior meal planning training and support and self confidence continues to improve  As evidenced by her returning for additional assistance and support with lifestyle behavior change, goal setting. Her ability to adhere is impeded by her lack of material resources and competing values    Intervention:  Nutrition education about goals, making shopping lists, methods of planning meals to increase nutrient value.  Coordination of care-  none Teaching Method Utilized: Visual,,Auditory,Hands on Handouts given during visit include: AVS Barriers to learning/adherence to lifestyle change: competing values, support, unhealthy food environment Demonstrated degree of understanding via:  Teach Back   Monitoring/Evaluation:  Dietary intake, exercise, and body weight in 2 week(s) Vanessa Palmer  Vanessa Palmer, RD 03/08/2017 10:48 AM. .

## 2017-03-08 NOTE — Patient Instructions (Signed)
What we talked about today:   Eating balanced meals to be sure you get 2-3 vegetables and fruits every day.   Good things about joining exercise place Bad things about joining exercise place  Feel better money  Fit in your dress Have to go out in the weather to get there  Have more energy   Meet people     The plan:  Make a list of the food groups and list what you have in your house now under each list. then see how long each list is and what you need more of to make the fruit and vegetable lists two times as long as the protein and starch lists.bring this list to your next appointment.  Fill in the chart below:  Dairy Proteins Vegetables Starch Fruits    chicken beets rice pears    Pork chops broccoli & cheese oatmeal     hamburger greens bread       corn       grits                                                                                                        Call anytime with questions or concerns  Norm ParcelDonna Khadir Roam Diabetes Educator [phone]

## 2017-03-13 DIAGNOSIS — N189 Chronic kidney disease, unspecified: Secondary | ICD-10-CM | POA: Diagnosis not present

## 2017-03-13 DIAGNOSIS — I129 Hypertensive chronic kidney disease with stage 1 through stage 4 chronic kidney disease, or unspecified chronic kidney disease: Secondary | ICD-10-CM | POA: Diagnosis not present

## 2017-03-13 DIAGNOSIS — I25118 Atherosclerotic heart disease of native coronary artery with other forms of angina pectoris: Secondary | ICD-10-CM | POA: Diagnosis not present

## 2017-03-13 DIAGNOSIS — I252 Old myocardial infarction: Secondary | ICD-10-CM | POA: Diagnosis not present

## 2017-03-27 ENCOUNTER — Other Ambulatory Visit: Payer: Self-pay | Admitting: Internal Medicine

## 2017-03-29 ENCOUNTER — Ambulatory Visit: Payer: Self-pay | Admitting: Dietician

## 2017-04-02 DIAGNOSIS — Z5181 Encounter for therapeutic drug level monitoring: Secondary | ICD-10-CM | POA: Diagnosis not present

## 2017-04-03 ENCOUNTER — Emergency Department (HOSPITAL_COMMUNITY)
Admission: EM | Admit: 2017-04-03 | Discharge: 2017-04-03 | Discharge status: Against Medical Advice | Payer: Medicare Other | Attending: Emergency Medicine | Admitting: Emergency Medicine

## 2017-04-03 ENCOUNTER — Encounter (HOSPITAL_COMMUNITY): Payer: Self-pay

## 2017-04-03 ENCOUNTER — Other Ambulatory Visit: Payer: Self-pay

## 2017-04-03 DIAGNOSIS — Z5321 Procedure and treatment not carried out due to patient leaving prior to being seen by health care provider: Secondary | ICD-10-CM | POA: Insufficient documentation

## 2017-04-03 DIAGNOSIS — T7840XA Allergy, unspecified, initial encounter: Secondary | ICD-10-CM | POA: Diagnosis not present

## 2017-04-03 DIAGNOSIS — R030 Elevated blood-pressure reading, without diagnosis of hypertension: Secondary | ICD-10-CM | POA: Diagnosis not present

## 2017-04-03 DIAGNOSIS — R229 Localized swelling, mass and lump, unspecified: Secondary | ICD-10-CM | POA: Diagnosis not present

## 2017-04-03 MED ORDER — KETOROLAC TROMETHAMINE 30 MG/ML IJ SOLN: 30.0000 mg | Freq: Once | INTRAMUSCULAR | Status: DC

## 2017-04-03 MED ORDER — ONDANSETRON 4 MG PO TBDP: 4.0000 mg | ORAL_TABLET | Freq: Once | ORAL | Status: DC

## 2017-04-03 MED ORDER — FAMOTIDINE 20 MG PO TABS
20.0000 mg | ORAL_TABLET | Freq: Once | ORAL | Status: AC
  Administered 2017-04-03: 20 mg via ORAL
  Filled 2017-04-03: qty 1

## 2017-04-03 MED ORDER — PREDNISONE 20 MG PO TABS
60.0000 mg | ORAL_TABLET | Freq: Once | ORAL | Status: AC
Start: 2017-04-03 — End: 2017-04-03
  Administered 2017-04-03: 60 mg via ORAL
  Filled 2017-04-03: qty 3

## 2017-04-03 NOTE — ED Provider Notes (Addendum)
Patient placed in Quick Look pathway, seen and evaluated   Chief Complaint: hives  HPI:  Developed itchy rash to L neck, R arm, R thigh and back 2 hrs ago. Just had hair braid.  No other environmental changes, no medication, body wash, or detergent changes.   ROS: no fever, headache, trouble breathing, wheezing or abdominal cramping  Physical Exam:   Gen: No distress  Neuro: Awake and Alert  Skin: Warm    Focused Exam: mild pruritic rash noted to L neck, R forearm, R thigh.  Mouth with normal mucosa, no tongue swelling, Lungs CTAB, no wheezing, abd soft and non tender.    Initiation of care has begun. The patient has been counseled on the process, plan, and necessity for staying for the completion/evaluation, and the remainder of the medical screening examination     Fayrene Helperran, Aiesha Leland, PA-C 04/03/17 1710   ADDENDUM: pt LWBS.  Cosigner is Margarita Grizzleanielle Ray, MD.     Fayrene Helperran, Latausha Flamm, PA-C 04/17/17 [id number]    Margarita Grizzleay, Danielle, MD 04/19/17 [phone]

## 2017-04-03 NOTE — ED Notes (Signed)
Pt came up to desk and stated that she was going home

## 2017-04-03 NOTE — ED Triage Notes (Signed)
Pt presents to the ed with complaints of having a possible of allergic reaction. Complains of swelling in left jaw and itching and hives to both forearms.  Pt states this feels like previous allergic reactions she has had before to peanuts. Reports not coming in contact with peanuts and denies any new foods, body wash, soap or detergents. Pt has no difficulty breathing.

## 2017-04-05 ENCOUNTER — Ambulatory Visit (INDEPENDENT_AMBULATORY_CARE_PROVIDER_SITE_OTHER): Payer: Medicare Other | Admitting: Dietician

## 2017-04-05 ENCOUNTER — Encounter: Payer: Self-pay | Admitting: Dietician

## 2017-04-05 DIAGNOSIS — D649 Anemia, unspecified: Secondary | ICD-10-CM | POA: Diagnosis not present

## 2017-04-05 DIAGNOSIS — Z713 Dietary counseling and surveillance: Secondary | ICD-10-CM

## 2017-04-05 DIAGNOSIS — N183 Chronic kidney disease, stage 3 unspecified: Secondary | ICD-10-CM

## 2017-04-05 NOTE — Patient Instructions (Signed)
Keep writing down what you eat and drink.  What to do about snacks at night- popcorn or veggies instead of chips or fruit when you want something sweet  Have fun at your cooking class.  Take your list shopping with you

## 2017-04-05 NOTE — Progress Notes (Signed)
  Medical Nutrition Therapy:  Appt start time: 1315 PM   end time:  1350 PM. Visit #10  Assessment:  Primary concerns today: weight loss and healthy eating  for kidney disease, anemia. [name] kept food records for the 2 weeks. She did not make a list of her foods on hand, so we did this today and planned her next shopping trip. Her weight is increased today. She feels her weight gain is from too much and what she is snacking on at night.  She is attending a cooking school at her residence that is reinforcing what she is learning here  Diet record shows more vegetables but still high in  Protein and processed carbs.  She is limited by lack of material resources. She accessed our pantry today.  ATHROPOMETRICS: weight-250.8#. Increased 10# in 4 weeks. - new weight goal 230#  Activity: ADLS, thinking about going to [facility], but transportation is a barrier.  Estimated energy needs: 1500-1600 for gradual loss 180- 200g carbohydrates 78 grams protein/day   Progress Towards Goal(s):  Some progress.   Nutritional Diagnosis:  NB-1.1 Food and nutrition-related knowledge deficit As related to lack of sufficient prior meal planning training and support and self confidence continues to improve  As evidenced by her returning for additional assistance and support with lifestyle behavior change, goal setting. Her ability to adhere is impeded by her lack of material resources and competing values    Intervention:  Nutrition education about goals, making shopping lists to be sure she has balanced food choices on hand, methods of planning meals to increase nutrient value, decrease calories.   Coordination of care-  none Teaching Method Utilized: Visual,,Auditory,Hands on Handouts given during visit include: AVS Barriers to learning/adherence to lifestyle change: competing values, support, unhealthy food environment Demonstrated degree of understanding via:  Teach Back   Monitoring/Evaluation:   Dietary intake, exercise, and body weight in 2 week(s) Norm Parcelonna Plyler, RD 04/05/2017 1:59 PM. .

## 2017-04-11 DIAGNOSIS — J45901 Unspecified asthma with (acute) exacerbation: Secondary | ICD-10-CM | POA: Diagnosis not present

## 2017-04-11 DIAGNOSIS — R609 Edema, unspecified: Secondary | ICD-10-CM | POA: Diagnosis not present

## 2017-04-11 DIAGNOSIS — M1990 Unspecified osteoarthritis, unspecified site: Secondary | ICD-10-CM | POA: Diagnosis not present

## 2017-04-12 ENCOUNTER — Other Ambulatory Visit: Payer: Self-pay | Admitting: Internal Medicine

## 2017-04-19 ENCOUNTER — Ambulatory Visit (INDEPENDENT_AMBULATORY_CARE_PROVIDER_SITE_OTHER): Payer: Medicare Other | Admitting: Dietician

## 2017-04-19 ENCOUNTER — Encounter: Payer: Self-pay | Admitting: Dietician

## 2017-04-19 DIAGNOSIS — N183 Chronic kidney disease, stage 3 unspecified: Secondary | ICD-10-CM

## 2017-04-19 DIAGNOSIS — Z713 Dietary counseling and surveillance: Secondary | ICD-10-CM

## 2017-04-19 DIAGNOSIS — Z6841 Body Mass Index (BMI) 40.0 and over, adult: Secondary | ICD-10-CM | POA: Diagnosis not present

## 2017-04-19 DIAGNOSIS — D649 Anemia, unspecified: Secondary | ICD-10-CM | POA: Diagnosis not present

## 2017-04-19 NOTE — Progress Notes (Signed)
  Medical Nutrition Therapy:  Appt start time: 1015 PM   end time:  1050 AM. Visit #11  Assessment:  Primary concerns today: weight loss and healthy eating  for kidney disease, anemia. [name] kept food records for the past 2 weeks. She is attending a cooking class in her building that is reinforcing our goals of mela plnning. She shows me a grocery list she made based on her food in hand.   She reports that she is restarting an alcohol class that she had attended in the past and has been clean from alcohol for 14 days.    Diet record shows more fruits and vegetables. She skips meal and has inadequate meals. She is limited by lack of material resources. She accessed our pantry today.  ATHROPOMETRICS: weight-245.3#. Her new weight goal 230#  Activity: ADLS, waiting for nice weather to go to [facility]  Estimated energy needs: 1500-1600 for gradual loss 180- 200g carbohydrates 78 grams protein/day   Progress Towards Goal(s):  Some progress.   Nutritional Diagnosis:  NB-1.1 Food and nutrition-related knowledge deficit As related to lack of sufficient prior meal planning training and support and self confidence continues to improve  As evidenced by her returning for additional assistance and support with lifestyle behavior change, goal setting. Her ability to adhere is impeded by her lack of material resources and competing values- such as her reported current problem with alcohol intake.    Intervention:  Nutrition education: assisted her in self evaluating her food records. Encouraged alcohol abstinence.    Coordination of care-  None Teaching Method Utilized: Visual,,Auditory,Hands on Handouts given during visit include: AVS Barriers to learning/adherence to lifestyle change: competing values, support, unhealthy food environment Demonstrated degree of understanding via:  Teach Back   Monitoring/Evaluation:  Dietary intake, exercise, and body weight in 2 week(s) Vanessa Palmer,  RD 04/19/2017 10:56 AM. .

## 2017-04-19 NOTE — Patient Instructions (Addendum)
Keep up the great work of recording your intake and bringing them with you!.   Go to Monday program to help with weight loss and better health.   See you in two weeks

## 2017-04-29 ENCOUNTER — Ambulatory Visit (INDEPENDENT_AMBULATORY_CARE_PROVIDER_SITE_OTHER): Payer: Medicare Other

## 2017-04-29 ENCOUNTER — Ambulatory Visit (INDEPENDENT_AMBULATORY_CARE_PROVIDER_SITE_OTHER): Payer: Medicare Other | Admitting: Orthopaedic Surgery

## 2017-04-29 ENCOUNTER — Encounter (INDEPENDENT_AMBULATORY_CARE_PROVIDER_SITE_OTHER): Payer: Self-pay | Admitting: Orthopaedic Surgery

## 2017-04-29 DIAGNOSIS — G8929 Other chronic pain: Secondary | ICD-10-CM | POA: Diagnosis not present

## 2017-04-29 DIAGNOSIS — M25561 Pain in right knee: Secondary | ICD-10-CM

## 2017-04-29 DIAGNOSIS — M179 Osteoarthritis of knee, unspecified: Secondary | ICD-10-CM | POA: Insufficient documentation

## 2017-04-29 DIAGNOSIS — M25562 Pain in left knee: Secondary | ICD-10-CM

## 2017-04-29 DIAGNOSIS — M1712 Unilateral primary osteoarthritis, left knee: Secondary | ICD-10-CM

## 2017-04-29 DIAGNOSIS — M1711 Unilateral primary osteoarthritis, right knee: Secondary | ICD-10-CM | POA: Diagnosis not present

## 2017-04-29 NOTE — Progress Notes (Signed)
Office Visit Note   Patient: Vanessa Palmer           Date of Birth: 06/16/1950           MRN: [id number] Visit Date: 04/29/2017              Requested by: [name], [name], MD [address] [place], [place] [address] PCP: [name], [name], MD   Assessment & Plan: Visit Diagnoses:  1. Chronic pain of both knees   2. Unilateral primary osteoarthritis, left knee   3. Unilateral primary osteoarthritis, right knee     Plan: I showed her x-rays and we talked in detail about her knee arthritis.  I recommended steroid injections and consider hyaluronic acid but she is deferring these for now.  I would not recommend knee replacement surgery without losing some weight and working on quad strengthening exercises to I like to send her to outpatient physical therapy and she agrees with this.  Again I talked about injections because I think this would help temporize her pain and modified she cannot take anti-inflammatories but she said she is fearful that would hurt too much although this pain is much less than knee replacement pain.  I went over knee model and showed her in detail what a knee replacement involves.  We will set her up for outpatient physical therapy first.  Follow-Up Instructions: Return in about 6 weeks (around 06/10/2017).   Orders:  Orders Placed This Encounter  Procedures  . XR Knee 1-2 Views Left  . XR Knee 1-2 Views Right   No orders of the defined types were placed in this encounter.     Procedures: No procedures performed   Clinical Data: No additional findings.   Subjective: Chief Complaint  Patient presents with  . Left Knee - Follow-up  . Right Knee - Follow-up  The patient is someone we have seen in the past.  She comes in today with severe bilateral knee pain.  This is been hurting for some period of time.  She ambulates with a rolling walker.  Her pain is daily and is detrimentally affected her activity living, quality of life, and her mobility.  She does  weigh 240 pounds.  She is try to work on activity modification.  She cannot take anti-inflammatories due to chronic kidney disease.  HPI  Review of Systems She currently denies any headache, chest pain, shortness of breath, fever, chills, nausea, vomiting.  She does have a history of sleep apnea and heart disease.  She is alert and oriented x3 and in no acute distress  Objective: Vital Signs: There were no vitals taken for this visit.  Physical Exam She is alert and oriented x3 and in no acute distress Ortho Exam Examination of both knees show fluid range of motion of both knees.  Both knees have patellofemoral crepitation and global tenderness.  Both knees are ligamentously stable.  Neither knee has an effusion. Specialty Comments:  No specialty comments available.  Imaging: Xr Knee 1-2 Views Left  Result Date: 04/29/2017 2 views of the left knee show severe tricompartmental arthritic changes with joint space narrowing and periarticular osteophytes in all 3 compartments.  There is slight varus malalignment.  Xr Knee 1-2 Views Right  Result Date: 04/29/2017 2 views of the right knee shows severe tricompartmental arthritic changes with joint space narrowing and periarticular osteophytes in all 3 compartments.  There is slight varus malalignment.    PMFS History: Patient Active Problem List  Diagnosis Date Noted  . Chronic pain of both knees 04/29/2017  . Unilateral primary osteoarthritis, left knee 04/29/2017  . Unilateral primary osteoarthritis, right knee 04/29/2017  . New-onset angina (HCC) 02/21/2017  . LVH (left ventricular hypertrophy) 08/15/2016  . Status post total replacement of right hip 11/23/2014  . Osteoarthritis of right hip 10/13/2014  . Nocturnal leg cramps 01/06/2014  . DVT of lower extremity, bilateral (HCC) 04/04/2012  . Routine health maintenance 01/14/2012  . GERD (gastroesophageal reflux disease) 01/09/2012  . Urinary incontinence 12/31/2011  . Chronic  kidney disease (CKD), stage III (moderate) (HCC) 09/19/2010  . Chronic pelvic pain in female 02/03/2010  . Asthma, chronic 09/27/2009  . Elevated alkaline phosphatase level 08/01/2009  . Pain in joint, multiple sites 12/21/2008  . MAJOR DPRSV DISORDER RECURRENT EPISODE MODERATE 09/08/2008  . Gout 07/05/2008  . Hyperlipidemia 05/07/2006  . Coronary atherosclerosis 02/05/2006  . Morbid obesity (HCC) 12/13/2005  . History of alcohol abuse 12/13/2005  . SLEEP APNEA, OBSTRUCTIVE 12/13/2005  . Essential hypertension 12/13/2005   Past Medical History:  Diagnosis Date  . ALCOHOL ABUSE 12/13/2005   Annotation: Sober since 11/06 Qualifier: Diagnosis of  By: Wallace Cullens MD, Natalia Leatherwood    . Anemia   . Arthritis    "all over" (02/21/2017)  . CAD (coronary artery disease)    s/p non-Q wave MI in 06/2001 and 2004, 2005  . CHF (congestive heart failure) (HCC)   . Chronic kidney disease (CKD), stage III (moderate) (HCC) 09/19/2010  . Chronic mid back pain   . Depression   . DJD (degenerative joint disease)   . DVT (deep venous thrombosis) (HCC)    BLE  . DVT of lower extremity, bilateral (HCC) 04/04/2012   On Xarelto    . GERD (gastroesophageal reflux disease)   . Gout   . Hyperlipidemia   . Hypertension   . INTRINSIC ASTHMA, WITH EXACERBATION 09/27/2009   Qualifier: Diagnosis of  By: Denton Meek MD, Tillie Rung    . Migraine    "none anymore" (02/21/2017)  . Myocardial infarction [facility]) ?2005  . Obesity   . OSA (obstructive sleep apnea)    previously used CPAP, has lost it (02/21/2017)  . Seizures (HCC)    As a teenager     Family History  Problem Relation Age of Onset  . Mental illness Mother   . Heart disease Father   . Diabetes Sister   . Diabetes Sister   . Diabetes Sister     Past Surgical History:  Procedure Laterality Date  . ABDOMINAL HYSTERECTOMY     partial; due to endometriosis  . CARDIAC CATHETERIZATION  06/2001, 02/2002, 10/2004    (10-15% proximal stenosis of circumflex, diffuse  disease of  OM1 and RCA)  . CARDIAC CATHETERIZATION  02/21/2017  . COLONOSCOPY    . JOINT REPLACEMENT    . KNEE ARTHROSCOPY Left   . LEFT HEART CATH AND CORONARY ANGIOGRAPHY N/A 02/21/2017   Procedure: LEFT HEART CATH AND CORONARY ANGIOGRAPHY;  Surgeon: Rinaldo Cloud, MD;  Location: [facility] INVASIVE CV [facility];  Service: Cardiovascular;  Laterality: N/A;  . TOTAL HIP ARTHROPLASTY Right 11/23/2014   Procedure: RIGHT TOTAL HIP ARTHROPLASTY ANTERIOR APPROACH;  Surgeon: Kathryne Hitch, MD;  Location: [facility] OR;  Service: Orthopedics;  Laterality: Right;  . TUBAL LIGATION     Social History   Occupational History  . 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 Smoking status: Never Smoker  .  Smokeless tobacco: Never Used  Substance and Sexual Activity  . Alcohol use: Yes    Alcohol/week: 7.2 oz    Types: 6 Cans of beer, 6 Standard drinks or equivalent per week    Comment: 02/21/2017 "don't drink qd; usually only on weekends and holidays"  . Drug use: No  . Sexual activity: No

## 2017-04-30 ENCOUNTER — Other Ambulatory Visit (INDEPENDENT_AMBULATORY_CARE_PROVIDER_SITE_OTHER): Payer: Self-pay

## 2017-04-30 DIAGNOSIS — G8929 Other chronic pain: Secondary | ICD-10-CM

## 2017-04-30 DIAGNOSIS — M25562 Pain in left knee: Principal | ICD-10-CM

## 2017-04-30 DIAGNOSIS — M25561 Pain in right knee: Principal | ICD-10-CM

## 2017-05-01 ENCOUNTER — Ambulatory Visit (INDEPENDENT_AMBULATORY_CARE_PROVIDER_SITE_OTHER): Payer: Medicare Other | Admitting: Internal Medicine

## 2017-05-01 ENCOUNTER — Other Ambulatory Visit: Payer: Self-pay

## 2017-05-01 ENCOUNTER — Encounter: Payer: Self-pay | Admitting: Internal Medicine

## 2017-05-01 VITALS — BP 148/91 | HR 64 | Temp 98.1°F | Ht 62.0 in | Wt 245.0 lb

## 2017-05-01 DIAGNOSIS — Z885 Allergy status to narcotic agent status: Secondary | ICD-10-CM | POA: Diagnosis not present

## 2017-05-01 DIAGNOSIS — G4733 Obstructive sleep apnea (adult) (pediatric): Secondary | ICD-10-CM | POA: Diagnosis not present

## 2017-05-01 DIAGNOSIS — J45909 Unspecified asthma, uncomplicated: Secondary | ICD-10-CM | POA: Diagnosis not present

## 2017-05-01 DIAGNOSIS — J208 Acute bronchitis due to other specified organisms: Secondary | ICD-10-CM

## 2017-05-01 DIAGNOSIS — J40 Bronchitis, not specified as acute or chronic: Secondary | ICD-10-CM

## 2017-05-01 MED ORDER — PROMETHAZINE-CODEINE 6.25-10 MG/5ML PO SYRP: 5.0000 mL | ORAL_SOLUTION | Freq: Four times a day (QID) | ORAL | 0 refills | Status: DC | PRN

## 2017-05-01 NOTE — Patient Instructions (Addendum)
Thank you for allowing us to provide your care. Please return to the clinic if you start to have fevers, wheezing, or SOB with movement or your daily activities. I did send out some cough medicine that you can take in the evening. Please keep your appointment with Dr. Criselda PeachesMullen on 08/07/17.  Contact a doctor if:  Your symptoms do not get better in 2 weeks. Get help right away if:  You cough up blood.  You have chest pain.  You have very bad shortness of breath.  You become dehydrated.  You faint (pass out) or keep feeling like you are going to pass out.  You keep throwing up (vomiting).  You have a very bad headache.  Your fever or chills gets worse.  Acute Bronchitis, Adult Acute bronchitis is when air tubes (bronchi) in the lungs suddenly get swollen. The condition can make it hard to breathe. It can also cause these symptoms:  A cough.  Coughing up clear, yellow, or green mucus.  Wheezing.  Chest congestion.  Shortness of breath.  A fever.  Body aches.  Chills.  A sore throat.  Follow these instructions at home: Medicines  Take over-the-counter and prescription medicines only as told by your doctor.  If you were prescribed an antibiotic medicine, take it as told by your doctor. Do not stop taking the antibiotic even if you start to feel better. General instructions  Rest.  Drink enough fluids to keep your pee (urine) clear or pale yellow.  Avoid smoking and secondhand smoke. If you smoke and you need help quitting, ask your doctor. Quitting will help your lungs heal faster.  Use an inhaler, cool mist vaporizer, or humidifier as told by your doctor.  Keep all follow-up visits as told by your doctor. This is important. How is this prevented? To lower your risk of getting this condition again:  Wash your hands often with soap and water. If you cannot use soap and water, use hand sanitizer.  Avoid contact with people who have cold symptoms.  Try not to  touch your hands to your mouth, nose, or eyes.  Make sure to get the flu shot every year.  Contact a doctor if:  Your symptoms do not get better in 2 weeks. Get help right away if:  You cough up blood.  You have chest pain.  You have very bad shortness of breath.  You become dehydrated.  You faint (pass out) or keep feeling like you are going to pass out.  You keep throwing up (vomiting).  You have a very bad headache.  Your fever or chills gets worse. This information is not intended to replace advice given to you by your health care provider. Make sure you discuss any questions you have with your health care provider. Document Released: 08/01/2007 Document Revised: 09/21/2015 Document Reviewed: 08/03/2015 Elsevier Interactive Patient Education  [employer]2018 Elsevier Inc.

## 2017-05-01 NOTE — Progress Notes (Signed)
   CC: Productive cough  HPI:  [name] is a 67 y.o. with asthma and obstructive sleep apnea who presented to the clinic today with acute progressive cough of two weeks duration. She states that two weeks prior to presentation she had sinus congestion and sore throat. These symptoms have since resolved and she has now developed a cough. This past Saturday, 04/27/17, her cough became productive green sputum. She is not tried over-the-counter cough or mucolytics. She does have asthma but states that she is not on any inhalers except for albuterol rescue inhaler. She has not been hospitalized for asthma in several years and has never had to be intubated. She denies fevers/chills, nausea/vomiting, abdominal pain, myalgias, arthralgias, wheezing, shortness of breath at rest or with exertion, and chest pain.  Past Medical History:  Diagnosis Date  . ALCOHOL ABUSE 12/13/2005   Annotation: Sober since 11/06 Qualifier: Diagnosis of  By: Wallace CullensGray MD, Natalia LeatherwoodKatherine    . Anemia   . Arthritis    "all over" (02/21/2017)  . CAD (coronary artery disease)    s/p non-Q wave MI in 06/2001 and 2004, 2005  . CHF (congestive heart failure) (HCC)   . Chronic kidney disease (CKD), stage III (moderate) (HCC) 09/19/2010  . Chronic mid back pain   . Depression   . DJD (degenerative joint disease)   . DVT (deep venous thrombosis) (HCC)    BLE  . DVT of lower extremity, bilateral (HCC) 04/04/2012   On Xarelto    . GERD (gastroesophageal reflux disease)   . Gout   . Hyperlipidemia   . Hypertension   . INTRINSIC ASTHMA, WITH EXACERBATION 09/27/2009   Qualifier: Diagnosis of  By: Denton MeekKarimova MD, Tillie RungNodira    . Migraine    "none anymore" (02/21/2017)  . Myocardial infarction [facility](HCC) ?2005  . Obesity   . OSA (obstructive sleep apnea)    previously used CPAP, has lost it (02/21/2017)  . Seizures (HCC)    As a teenager    Review of Systems:  12 point ROS preformed. All negative aside from those mentioned in the  HPI.  Physical Exam: Vitals:   05/01/17 0906  BP: (!) 148/91  Pulse: 64  Temp: 98.1 F (36.7 C)  TempSrc: Oral  SpO2: 100%  Weight: 245 lb (111.1 kg)  Height: 5\' 2"  (1.575 m)   General: Well nourished female in no acute distress HENT: Normocephalic, atraumatic, moist mucus membranes Pulm: Good air movement with no wheezing or crackles  CV: RRR, no murmurs, no rubs  Abdomen: Active bowel sounds, soft, non-distended, no tenderness to palpation  Extremities: Pulses palpable in all extremities, trace LE edema  Skin: Warm and dry  Neuro: Alert and oriented x 3  Assessment & Plan:   See Encounters Tab for problem based charting.  Patient discussed with Dr. Heide SparkNarendra

## 2017-05-01 NOTE — Assessment & Plan Note (Signed)
Patient's presentation is most consistent with bronchitis status post recent viral URI. At this point I do not see any signs or symptoms of acute asthma exacerbation do not think steroids would be of benefit. We discussed primary treatment for bronchitis and symptom management. I prescribed codeine cough syrup to help with cough suppression and decreased secretions. She does have an allergy listed to hydromorphone and oxycodone. She states that when she takes his medications she becomes very itchy. We discussed at the promethazine-codeine cough syrup can cause similar adverse effects. She would still like to try the cough syrup. We discussed that if she begins to have fevers, wheezing, shortness of breath that she needs to return to the clinic for further evaluation. She agrees with the plan.  Plan: - Start promethazine-codeine cough syrup at night  - Follow-up if symptoms worsen or do not resolve

## 2017-05-02 ENCOUNTER — Other Ambulatory Visit: Payer: Self-pay | Admitting: Internal Medicine

## 2017-05-07 ENCOUNTER — Ambulatory Visit: Payer: Medicare Other | Attending: Orthopaedic Surgery | Admitting: Physical Therapy

## 2017-05-07 ENCOUNTER — Other Ambulatory Visit: Payer: Self-pay | Admitting: Internal Medicine

## 2017-05-07 DIAGNOSIS — G8929 Other chronic pain: Secondary | ICD-10-CM | POA: Insufficient documentation

## 2017-05-07 DIAGNOSIS — R2689 Other abnormalities of gait and mobility: Secondary | ICD-10-CM | POA: Insufficient documentation

## 2017-05-07 DIAGNOSIS — M6281 Muscle weakness (generalized): Secondary | ICD-10-CM

## 2017-05-07 DIAGNOSIS — K219 Gastro-esophageal reflux disease without esophagitis: Secondary | ICD-10-CM

## 2017-05-07 DIAGNOSIS — M25561 Pain in right knee: Secondary | ICD-10-CM | POA: Diagnosis not present

## 2017-05-07 DIAGNOSIS — M25562 Pain in left knee: Secondary | ICD-10-CM | POA: Insufficient documentation

## 2017-05-07 DIAGNOSIS — M25662 Stiffness of left knee, not elsewhere classified: Secondary | ICD-10-CM | POA: Insufficient documentation

## 2017-05-07 DIAGNOSIS — M25661 Stiffness of right knee, not elsewhere classified: Secondary | ICD-10-CM | POA: Diagnosis not present

## 2017-05-07 NOTE — Therapy (Signed)
[facility] [address] [place], [place], [address] Phone: [phone]   Fax:  [phone]  Physical Therapy Evaluation  Patient Details  Name: Vanessa Palmer MRN: [id number] Date of Birth: 09-Nov-1950 Referring Provider: Dr. Doneen Poisson    Encounter Date: 05/07/2017  PT End of Session - 05/07/17 1105    Visit Number  1    Number of Visits  12    Date for PT Re-Evaluation  06/21/17    PT Start Time  1018    PT Stop Time  1101    PT Time Calculation (min)  43 min    Activity Tolerance  Patient tolerated treatment well    Behavior During Therapy  [facility] for tasks assessed/performed       Past Medical History:  Diagnosis Date  . ALCOHOL ABUSE 12/13/2005   Annotation: Sober since 11/06 Qualifier: Diagnosis of  By: Wallace Cullens MD, Natalia Leatherwood    . Anemia   . Arthritis    "all over" (02/21/2017)  . CAD (coronary artery disease)    s/p non-Q wave MI in 06/2001 and 2004, 2005  . CHF (congestive heart failure) (HCC)   . Chronic kidney disease (CKD), stage III (moderate) (HCC) 09/19/2010  . Chronic mid back pain   . Depression   . DJD (degenerative joint disease)   . DVT (deep venous thrombosis) (HCC)    BLE  . DVT of lower extremity, bilateral (HCC) 04/04/2012   On Xarelto    . GERD (gastroesophageal reflux disease)   . Gout   . Hyperlipidemia   . Hypertension   . INTRINSIC ASTHMA, WITH EXACERBATION 09/27/2009   Qualifier: Diagnosis of  By: Denton Meek MD, Tillie Rung    . Migraine    "none anymore" (02/21/2017)  . Myocardial infarction [facility]) ?2005  . Obesity   . OSA (obstructive sleep apnea)    previously used CPAP, has lost it (02/21/2017)  . Seizures (HCC)    As a teenager     Past Surgical History:  Procedure Laterality Date  . ABDOMINAL HYSTERECTOMY     partial; due to endometriosis  . CARDIAC CATHETERIZATION  06/2001, 02/2002, 10/2004    (10-15% proximal stenosis of circumflex, diffuse disease of  OM1 and RCA)  . CARDIAC  CATHETERIZATION  02/21/2017  . COLONOSCOPY    . JOINT REPLACEMENT    . KNEE ARTHROSCOPY Left   . LEFT HEART CATH AND CORONARY ANGIOGRAPHY N/A 02/21/2017   Procedure: LEFT HEART CATH AND CORONARY ANGIOGRAPHY;  Surgeon: Rinaldo Cloud, MD;  Location: [facility] INVASIVE CV LAB;  Service: Cardiovascular;  Laterality: N/A;  . TOTAL HIP ARTHROPLASTY Right 11/23/2014   Procedure: RIGHT TOTAL HIP ARTHROPLASTY ANTERIOR APPROACH;  Surgeon: Kathryne Hitch, MD;  Location: [facility] OR;  Service: Orthopedics;  Laterality: Right;  . TUBAL LIGATION      There were no vitals filed for this visit.   Subjective Assessment - 05/07/17 1024    Subjective  Patient has chronic knee pain for >5 yrs.  It has worsened over the past year.  She uses a PWC to go to the store.  RW and cane in her home.  She has difficulty walking, standing and bending knees.  She has cramping in her legs in bilateral post thighs, thighs and even her feet.      Limitations  Sitting;Lifting;Standing;Walking;House hold activities;Other (comment) sleeping, home tasks, ADLs    How long can you sit comfortably?  can sit 30 min but gets really stiff in back and knees  How long can you stand comfortably?  5 min maybe     How long can you walk comfortably?  5 min maybe     Diagnostic tests  severe tricompartmental degenerative OA in bilateral knees    Patient Stated Goals  Pt wants to have less pain, feel stronger     Currently in Pain?  Yes    Pain Score  6     Pain Location  Knee    Pain Orientation  Right    Pain Descriptors / Indicators  Throbbing    Pain Type  Chronic pain    Pain Onset  More than a month ago    Pain Frequency  Intermittent    Aggravating Factors   walking, standing    Pain Relieving Factors  meds    Effect of Pain on Daily Activities  limits her mobility     Multiple Pain Sites  Yes    Pain Score  6    Pain Location  Knee    Pain Orientation  Left    Pain Descriptors / Indicators  Throbbing    Pain Type  Chronic  pain    Pain Onset  More than a month ago    Pain Frequency  Intermittent    Aggravating Factors   see above     Pain Relieving Factors  see above     Effect of Pain on Daily Activities  see above          [facility] - 05/07/17 0001      Assessment   Medical Diagnosis  bilateral knee pain     Referring Provider  Dr. Doneen Poissonhristopher Blackman     Onset Date/Surgical Date  -- chronic     Prior Therapy  for lumbar , Hip       Precautions   Precautions  None    Precaution Comments  fall? needs assistive device or HHA       Restrictions   Weight Bearing Restrictions  No      Balance Screen   Has the patient fallen in the past 6 months  Yes    How many times?  2    Has the patient had a decrease in activity level because of a fear of falling?   Yes    Is the patient reluctant to leave their home because of a fear of falling?   No      Home Nurse, mental healthnvironment   Living Environment  Private residence    Living Arrangements  Alone    Available Help at Discharge  Available PRN/intermittently    Type of Home  Independent living facility senior housing     Home Access  Level entry    Home Equipment  [place] - 2 wheels;Cane - single point;Wheelchair - power      Prior Function   Level of Independence  Independent with household mobility with device;Independent with community mobility with device;Needs assistance with ADLs;Needs assistance with homemaking    Vocation  Retired    Leisure  support group, Research scientist (medical)crafting       Cognition   Overall Cognitive Status  Within Functional Limits for tasks assessed      Observation/Other Assessments   Focus on Therapeutic Outcomes (FOTO)   NT       Circumferential Edema   Circumferential - Right  17 1/4 inch     Circumferential - Left   17 inch       Sensation   Light Touch  Appears Intact      Posture/Postural Control   Posture/Postural Control  Postural limitations    Postural Limitations  Rounded Shoulders;Forward head;Flexed trunk     Posture Comments  R knee varus       AROM   Right Knee Extension  24    Right Knee Flexion  100    Left Knee Extension  10    Left Knee Flexion  90      Strength   Strength Assessment Site  -- pain inhibited     Right Hip Flexion  3-/5    Left Hip Flexion  3-/5    Right Knee Flexion  4/5    Right Knee Extension  4-/5    Left Knee Flexion  4/5    Left Knee Extension  4-/5    Right Ankle Dorsiflexion  4/5    Left Ankle Dorsiflexion  4/5      Palpation   Palpation comment  did not tolerate, pain Rt. knee >L.  Pain with patellar mobs, medial joint line L knee       Transfers   Transfers  Stand Pivot Transfers    Stand Pivot Transfers  4: Min guard    Comments  HHA from [facility] to treatment table              Objective measurements completed on examination: See above findings.      [facility] Adult PT Treatment/Exercise - 05/07/17 0001      Knee/Hip Exercises: Stretches   Active Hamstring Stretch  Both;1 rep    Active Hamstring Stretch Limitations  HEP seated     Knee: Self-Stretch to increase Flexion  Left;3 reps    Knee: Self-Stretch Limitations  HEP, sheet      Knee/Hip Exercises: Seated   Long Arc Quad  Strengthening;Left;1 set;10 reps    Long Arc Quad Limitations  HEP      Knee/Hip Exercises: Supine   Quad Sets  Strengthening;Both;1 set    [employer] Limitations  HEP              PT Education - 05/07/17 1051    Education provided  Yes    Education Details  PT, POC, HEP, eval findings, degenerative knee OA     Person(s) Educated  Patient    Methods  Explanation    Comprehension  Verbalized understanding;Returned demonstration;Verbal cues required;Tactile cues required;Need further instruction       PT Short Term Goals - 05/07/17 1109      PT SHORT TERM GOAL #1   Title  Pt will be I with initial HEP for knee ROM and strength     Time  2    Period  Weeks    Status  New    Target Date  05/21/17      PT SHORT TERM GOAL #2   Title  Pt will complete  balance screen and set goal (TUG vs Sharlene Motts)     Time  2    Period  Weeks    Status  New    Target Date  05/21/17        PT Long Term Goals - 05/07/17 1106      PT LONG TERM GOAL #1   Title  Pt will be I with HEP for LE/knee AROM, strength.      Baseline  unknown given today     Time  6    Period  Weeks    Status  New  Target Date  06/21/17      PT LONG TERM GOAL #2   Title  Pt will be able to report 20-25%  less pain with mobility, transfers in general.     Baseline  pain mod to severe most of the time    Time  6    Period  Weeks    Status  New    Target Date  06/21/17      PT LONG TERM GOAL #3   Title  Pt will be able to walk 300 feet with RW and no more than min pain in knees.      Baseline  needs frequent rest breaks     Time  6    Period  Weeks    Status  New    Target Date  06/21/17      PT LONG TERM GOAL #4   Title  Pt will be able to improve balance based on functional screen.              Plan - 05/07/17 1110    Clinical Impression Statement  Pt presents with low complexity eval for chronic knee pain which has worsened over the past year.  She has significant limitations in functional mobility including knee ROM, strength, balance.  She needed min A to get from [facility] to mat table. She had difficulty transferring from mat to edge of bed. She does not want surgery or injections, so she is hopeful that PT wll help her.      History and Personal Factors relevant to plan of care:  CAD, CHF, OA, DJD, MI, history of ETOH abuse     Clinical Presentation  Evolving    Clinical Presentation due to:  progressive difficulty with walking, increasing dependence on assistive devices    Clinical Decision Making  Moderate    Rehab Potential  Good    PT Frequency  2x / week    PT Duration  6 weeks    PT Treatment/Interventions  ADLs/Self Care Home Management;Cryotherapy;Ultrasound;Functional mobility training;Neuromuscular re-education;Taping;Patient/family education;Manual  techniques;Therapeutic exercise;Passive range of motion;Therapeutic activities;Electrical Stimulation;Iontophoresis 4mg /ml Dexamethasone;Moist Heat;Gait training;Balance training    PT Next Visit Plan  check level 1 knee     PT Home Exercise Plan  LAQ, quad set, knee ROM, knee to chest     Consulted and Agree with Plan of Care  Patient       Patient will benefit from skilled therapeutic intervention in order to improve the following deficits and impairments:  Abnormal gait, Decreased balance, Decreased endurance, Decreased mobility, Hypomobility, Difficulty walking, Obesity, Increased edema, Decreased activity tolerance, Decreased strength, Increased fascial restricitons, Impaired flexibility, Postural dysfunction, Pain  Visit Diagnosis: Other abnormalities of gait and mobility  Muscle weakness (generalized)  Chronic pain of left knee  Chronic pain of right knee  Stiffness of left knee, not elsewhere classified  Stiffness of right knee, not elsewhere classified     Problem List Patient Active Problem List   Diagnosis Date Noted  . Chronic pain of both knees 04/29/2017  . Unilateral primary osteoarthritis, left knee 04/29/2017  . Unilateral primary osteoarthritis, right knee 04/29/2017  . New-onset angina (HCC) 02/21/2017  . LVH (left ventricular hypertrophy) 08/15/2016  . Status post total replacement of right hip 11/23/2014  . Osteoarthritis of right hip 10/13/2014  . Nocturnal leg cramps 01/06/2014  . Bronchitis 04/25/2012  . DVT of lower extremity, bilateral (HCC) 04/04/2012  . Routine health maintenance 01/14/2012  . GERD (gastroesophageal reflux disease) 01/09/2012  .  Urinary incontinence 12/31/2011  . Chronic kidney disease (CKD), stage III (moderate) (HCC) 09/19/2010  . Chronic pelvic pain in female 02/03/2010  . Asthma, chronic 09/27/2009  . Elevated alkaline phosphatase level 08/01/2009  . Pain in joint, multiple sites 12/21/2008  . MAJOR DPRSV DISORDER  RECURRENT EPISODE MODERATE 09/08/2008  . Gout 07/05/2008  . Hyperlipidemia 05/07/2006  . Coronary atherosclerosis 02/05/2006  . Morbid obesity (HCC) 12/13/2005  . History of alcohol abuse 12/13/2005  . SLEEP APNEA, OBSTRUCTIVE 12/13/2005  . Essential hypertension 12/13/2005    Deejay Koppelman 05/07/2017, 2:38 PM  [facility] [address] [place], [place], [address] Phone: [phone]   Fax:  [phone]  Name: EARLEEN AOUN MRN: [id number] Date of Birth: 10-05-1950  Karie Mainland, PT 05/07/17 2:39 PM Phone: [phone] Fax: [phone]

## 2017-05-07 NOTE — Patient Instructions (Addendum)
Quad Set    With other leg bent, foot flat, slowly tighten muscles on thigh of straight leg while counting out loud to _10___. Repeat with other leg. Repeat __10__ times. Do __2__ sessions per day.  http://gt2.exer.us/276   Copyright  VHI. All rights reserved.   KNEE: Flexion / Extension - Sitting    Sit at edge of surface, foot on towel or pillowcase. Bend and straighten knee. __10_ reps per set, __3_ sets per day, __7_ days per week Use opposite leg to increase knee flexion.    HIP: Hamstrings - Short Sitting    Rest leg on raised surface. Keep knee straight. Lift chest. Hold _30__ seconds. __3_ reps per set, _2__ sets per day, __7_ days per week  Copyright  VHI. All rights reserved.   Copyright  VHI. All rights reserved.     Knee Extension (Active / Resistive ROM)    Extend knee so that lower leg is straight. Hold _10___ seconds while counting out loud. Repeat with other leg. Repeat __10-20__ times. Do ___2_ sessions per day.  http://gt2.exer.us/618   Copyright  VHI. All rights reserved.  Knee to Chest (Flexion)    Pull knee toward chest. Feel stretch in lower back or buttock area. Breathing deeply, Hold __20-30__ seconds. Repeat with other knee. Repeat __3__ times. Do __2__ sessions per day.  http://gt2.exer.us/226   Copyright  VHI. All rights reserved.

## 2017-05-08 NOTE — Progress Notes (Signed)
Internal Medicine Clinic Attending  Case discussed with Dr. Helberg at the time of the visit.  We reviewed the resident's history and exam and pertinent patient test results.  I agree with the assessment, diagnosis, and plan of care documented in the resident's note.    

## 2017-05-10 ENCOUNTER — Ambulatory Visit: Payer: Self-pay | Admitting: Dietician

## 2017-05-20 ENCOUNTER — Other Ambulatory Visit: Payer: Self-pay

## 2017-05-20 MED ORDER — OXYCODONE-ACETAMINOPHEN 10-325 MG PO TABS: 1.0000 | ORAL_TABLET | Freq: Four times a day (QID) | ORAL | 0 refills | Status: DC | PRN

## 2017-05-20 NOTE — Telephone Encounter (Signed)
refilled 

## 2017-05-20 NOTE — Telephone Encounter (Signed)
3 Rxs oxyCODONE-acetaminophen (PERCOCET) 10-325 MG tablet last given at [facility] with PCP on 01/23/2017. This is not on patient's current med list.  Last UDS 02/16/2016 Next Appt with PCP 08/07/2017 L. Leward Quanucatte, RN, BSN

## 2017-05-20 NOTE — Telephone Encounter (Signed)
Pain med refill, pt is using [facility] on [address].

## 2017-05-21 ENCOUNTER — Ambulatory Visit: Payer: Medicare Other | Admitting: Physical Therapy

## 2017-05-21 ENCOUNTER — Encounter: Payer: Self-pay | Admitting: Physical Therapy

## 2017-05-21 DIAGNOSIS — M25662 Stiffness of left knee, not elsewhere classified: Secondary | ICD-10-CM | POA: Diagnosis not present

## 2017-05-21 DIAGNOSIS — M25661 Stiffness of right knee, not elsewhere classified: Secondary | ICD-10-CM | POA: Diagnosis not present

## 2017-05-21 DIAGNOSIS — M25561 Pain in right knee: Secondary | ICD-10-CM

## 2017-05-21 DIAGNOSIS — M25562 Pain in left knee: Secondary | ICD-10-CM | POA: Diagnosis not present

## 2017-05-21 DIAGNOSIS — R2689 Other abnormalities of gait and mobility: Secondary | ICD-10-CM | POA: Diagnosis not present

## 2017-05-21 DIAGNOSIS — G8929 Other chronic pain: Secondary | ICD-10-CM

## 2017-05-21 DIAGNOSIS — M6281 Muscle weakness (generalized): Secondary | ICD-10-CM

## 2017-05-21 NOTE — Therapy (Signed)
[facility] [address] [place], [place], [address] Phone: [phone]   Fax:  [phone]  Physical Therapy Treatment  Patient Details  Name: Vanessa Palmer MRN: [id number] Date of Birth: 08-25-1950 Referring Provider: Dr. Doneen Poisson    Encounter Date: 05/21/2017  PT End of Session - 05/21/17 0813    Visit Number  2    Number of Visits  12    Date for PT Re-Evaluation  06/21/17    PT Start Time  0804    PT Stop Time  0858    PT Time Calculation (min)  54 min    Activity Tolerance  Patient tolerated treatment well    Behavior During Therapy  [facility] for tasks assessed/performed       Past Medical History:  Diagnosis Date  . ALCOHOL ABUSE 12/13/2005   Annotation: Sober since 11/06 Qualifier: Diagnosis of  By: Wallace Cullens MD, Natalia Leatherwood    . Anemia   . Arthritis    "all over" (02/21/2017)  . CAD (coronary artery disease)    s/p non-Q wave MI in 06/2001 and 2004, 2005  . CHF (congestive heart failure) (HCC)   . Chronic kidney disease (CKD), stage III (moderate) (HCC) 09/19/2010  . Chronic mid back pain   . Depression   . DJD (degenerative joint disease)   . DVT (deep venous thrombosis) (HCC)    BLE  . DVT of lower extremity, bilateral (HCC) 04/04/2012   On Xarelto    . GERD (gastroesophageal reflux disease)   . Gout   . Hyperlipidemia   . Hypertension   . INTRINSIC ASTHMA, WITH EXACERBATION 09/27/2009   Qualifier: Diagnosis of  By: Denton Meek MD, Tillie Rung    . Migraine    "none anymore" (02/21/2017)  . Myocardial infarction [facility]) ?2005  . Obesity   . OSA (obstructive sleep apnea)    previously used CPAP, has lost it (02/21/2017)  . Seizures (HCC)    As a teenager     Past Surgical History:  Procedure Laterality Date  . ABDOMINAL HYSTERECTOMY     partial; due to endometriosis  . CARDIAC CATHETERIZATION  06/2001, 02/2002, 10/2004    (10-15% proximal stenosis of circumflex, diffuse disease of  OM1 and RCA)  . CARDIAC  CATHETERIZATION  02/21/2017  . COLONOSCOPY    . JOINT REPLACEMENT    . KNEE ARTHROSCOPY Left   . LEFT HEART CATH AND CORONARY ANGIOGRAPHY N/A 02/21/2017   Procedure: LEFT HEART CATH AND CORONARY ANGIOGRAPHY;  Surgeon: Rinaldo Cloud, MD;  [facility] IN[facility];  Service: Cardiovascular;  Laterality: N/A;  . TOTAL HIP ARTHROPLASTY Right 11/23/2014   Procedure: RIGHT TOTAL HIP ARTHROPLASTY ANTERIOR APPROACH;  Surgeon: Kathryne Hitch, MD;  Location: [facility] OR;  Service: Orthopedics;  Laterality: Right;  . TUBAL LIGATION      There were no vitals filed for this visit.  Subjective Assessment - 05/21/17 0810    Subjective  No new changes.  Pain 5/10, took meds around 3 am.  Walks with quad cane, does not have a single point cane.  She wants to get a Rolling walker with a seat.      Currently in Pain?  Yes    Pain Score  5     Pain Location  Knee    Pain Orientation  Right;Left    Pain Descriptors / Indicators  Aching    Pain Type  Chronic pain    Pain Onset  More than a month ago  Pain Frequency  Intermittent    Aggravating Factors   walking, standing     Pain Relieving Factors  meds, rest              [facility] Adult PT Treatment/Exercise - 05/21/17 0001      Self-Care   Self-Care  Other Self-Care Comments    Other Self-Care Comments   changed quad cane for gait (base facing the other way for safety       Knee/Hip Exercises: Stretches   Active Hamstring Stretch  Both;3 reps;30 seconds    Active Hamstring Stretch Limitations  supine with strap     Knee: Self-Stretch to increase Flexion  Left;5 reps;10 seconds    Knee: Self-Stretch Limitations  strap supine       Knee/Hip Exercises: Aerobic   Nustep  5 min L 3 UE and LE       Knee/Hip Exercises: Seated   Long Arc Quad  Strengthening;Left;1 set;20 reps;Weights    Long Arc Quad Weight  4 lbs.      Knee/Hip Exercises: Supine   Quad Sets  Strengthening;Both;1 set    Short Arc Quad Sets  Strengthening;Both;1 set;20  reps    Heel Slides  AAROM;Strengthening;Both;1 set;10 reps    Bridges  --    Straight Leg Raises  Strengthening;Both;1 set;10 reps      Knee/Hip Exercises: Sidelying   Hip ABduction  Strengthening;Both;1 set    Hip ABduction Limitations  unable to do effectively on LLE    Clams  x 10 each side       Moist Heat Therapy   Number Minutes Moist Heat  10 Minutes    Moist Heat Location  Knee bilat.              PT Education - 05/21/17 0813    Education provided  Yes    Education Details  HEP reinforcement     Person(s) Educated  Patient    Methods  Explanation    Comprehension  Verbalized understanding       PT Short Term Goals - 05/21/17 0819      PT SHORT TERM GOAL #1   Title  Pt will be I with initial HEP for knee ROM and strength     Status  On-going      PT SHORT TERM GOAL #2   Title  Pt will complete balance screen and set goal (TUG vs Berg)     Status  On-going      PT SHORT TERM GOAL #3   Title  Pt will report less pain in Rt. LE (25% less) with ADLs.     Status  On-going        PT Long Term Goals - 05/21/17 0820      PT LONG TERM GOAL #1   Title  Pt will be I with HEP for LE/knee AROM, strength.      Status  On-going      PT LONG TERM GOAL #2   Title  Pt will be able to report 20-25%  less pain with mobility, transfers in general.     Status  On-going      PT LONG TERM GOAL #3   Title  Pt will be able to walk 300 feet with RW and no more than min pain in knees.      Status  On-going      PT LONG TERM GOAL #4   Title  Pt will be able to improve balance based  on functional screen.     Status  On-going            Plan - 05/21/17 0813    Clinical Impression Statement  Pt able to perform level 1 -2 knee exercises with some degree of difficulty.  Lacks Rt. knee ROM >L.  Very weak quad sets.  Used MHP for soreness post session.     PT Treatment/Interventions  ADLs/Self Care Home Management;Cryotherapy;Ultrasound;Functional mobility  training;Neuromuscular re-education;Taping;Patient/family education;Manual techniques;Therapeutic exercise;Passive range of motion;Therapeutic activities;Electrical Stimulation;Iontophoresis 4mg /ml Dexamethasone;Moist Heat;Gait training;Balance training    PT Next Visit Plan  progress ROM, strength,  Hip strength     PT Home Exercise Plan  LAQ, quad set, knee ROM, knee to chest     Consulted and Agree with Plan of Care  Patient       Patient will benefit from skilled therapeutic intervention in order to improve the following deficits and impairments:  Abnormal gait, Decreased balance, Decreased endurance, Decreased mobility, Hypomobility, Difficulty walking, Obesity, Increased edema, Decreased activity tolerance, Decreased strength, Increased fascial restricitons, Impaired flexibility, Postural dysfunction, Pain  Visit Diagnosis: Other abnormalities of gait and mobility  Muscle weakness (generalized)  Chronic pain of left knee  Chronic pain of right knee  Stiffness of left knee, not elsewhere classified  Stiffness of right knee, not elsewhere classified     Problem List Patient Active Problem List   Diagnosis Date Noted  . Chronic pain of both knees 04/29/2017  . Unilateral primary osteoarthritis, left knee 04/29/2017  . Unilateral primary osteoarthritis, right knee 04/29/2017  . New-onset angina (HCC) 02/21/2017  . LVH (left ventricular hypertrophy) 08/15/2016  . Status post total replacement of right hip 11/23/2014  . Osteoarthritis of right hip 10/13/2014  . Nocturnal leg cramps 01/06/2014  . Bronchitis 04/25/2012  . DVT of lower extremity, bilateral (HCC) 04/04/2012  . Routine health maintenance 01/14/2012  . GERD (gastroesophageal reflux disease) 01/09/2012  . Urinary incontinence 12/31/2011  . Chronic kidney disease (CKD), stage III (moderate) (HCC) 09/19/2010  . Chronic pelvic pain in female 02/03/2010  . Asthma, chronic 09/27/2009  . Elevated alkaline phosphatase  level 08/01/2009  . Pain in joint, multiple sites 12/21/2008  . MAJOR DPRSV DISORDER RECURRENT EPISODE MODERATE 09/08/2008  . Gout 07/05/2008  . Hyperlipidemia 05/07/2006  . Coronary atherosclerosis 02/05/2006  . Morbid obesity (HCC) 12/13/2005  . History of alcohol abuse 12/13/2005  . SLEEP APNEA, OBSTRUCTIVE 12/13/2005  . Essential hypertension 12/13/2005    PAA,JENNIFER 05/21/2017, 10:36 AM  [facility] [address] [place], [place], [address] Phone: [phone]   Fax:  [phone]  Name: Vanessa Palmer MRN: [id number] Date of Birth: 1950/04/04  Karie Mainland, PT 05/21/17 10:37 AM Phone: [phone] Fax: [phone]

## 2017-05-24 ENCOUNTER — Ambulatory Visit: Payer: Medicare Other | Admitting: Physical Therapy

## 2017-05-27 ENCOUNTER — Ambulatory Visit: Payer: Medicare Other | Attending: Orthopaedic Surgery | Admitting: Physical Therapy

## 2017-05-27 ENCOUNTER — Other Ambulatory Visit: Payer: Self-pay | Admitting: Internal Medicine

## 2017-05-27 DIAGNOSIS — M25561 Pain in right knee: Secondary | ICD-10-CM | POA: Insufficient documentation

## 2017-05-27 DIAGNOSIS — M25562 Pain in left knee: Secondary | ICD-10-CM | POA: Diagnosis not present

## 2017-05-27 DIAGNOSIS — G8929 Other chronic pain: Secondary | ICD-10-CM | POA: Insufficient documentation

## 2017-05-27 DIAGNOSIS — M25662 Stiffness of left knee, not elsewhere classified: Secondary | ICD-10-CM | POA: Insufficient documentation

## 2017-05-27 DIAGNOSIS — M25661 Stiffness of right knee, not elsewhere classified: Secondary | ICD-10-CM | POA: Diagnosis not present

## 2017-05-27 DIAGNOSIS — M6281 Muscle weakness (generalized): Secondary | ICD-10-CM | POA: Insufficient documentation

## 2017-05-27 DIAGNOSIS — R2689 Other abnormalities of gait and mobility: Secondary | ICD-10-CM | POA: Diagnosis not present

## 2017-05-27 NOTE — Telephone Encounter (Signed)
Spoke to pharmacy they will fill today

## 2017-05-27 NOTE — Therapy (Signed)
[facility] [address] [place], [place], [address] Phone: [phone]   Fax:  [phone]  Physical Therapy Treatment  Patient Details  Name: Vanessa Palmer MRN: [id number] Date of Birth: Jan 04, 1951 Referring Provider: Dr. Doneen Poissonhristopher Blackman    Encounter Date: 05/27/2017  PT End of Session - 05/27/17 1213    Visit Number  3    Number of Visits  12    Date for PT Re-Evaluation  06/21/17    PT Start Time  1146    PT Stop Time  1243    PT Time Calculation (min)  57 min    Activity Tolerance  Patient tolerated treatment well    Behavior During Therapy  [facility] for tasks assessed/performed       Past Medical History:  Diagnosis Date  . ALCOHOL ABUSE 12/13/2005   Annotation: Sober since 11/06 Qualifier: Diagnosis of  By: Wallace CullensGray MD, Natalia LeatherwoodKatherine    . Anemia   . Arthritis    "all over" (02/21/2017)  . CAD (coronary artery disease)    s/p non-Q wave MI in 06/2001 and 2004, 2005  . CHF (congestive heart failure) (HCC)   . Chronic kidney disease (CKD), stage III (moderate) (HCC) 09/19/2010  . Chronic mid back pain   . Depression   . DJD (degenerative joint disease)   . DVT (deep venous thrombosis) (HCC)    BLE  . DVT of lower extremity, bilateral (HCC) 04/04/2012   On Xarelto    . GERD (gastroesophageal reflux disease)   . Gout   . Hyperlipidemia   . Hypertension   . INTRINSIC ASTHMA, WITH EXACERBATION 09/27/2009   Qualifier: Diagnosis of  By: Denton MeekKarimova MD, Tillie RungNodira    . Migraine    "none anymore" (02/21/2017)  . Myocardial infarction [facility](HCC) ?2005  . Obesity   . OSA (obstructive sleep apnea)    previously used CPAP, has lost it (02/21/2017)  . Seizures (HCC)    As a teenager     Past Surgical History:  Procedure Laterality Date  . ABDOMINAL HYSTERECTOMY     partial; due to endometriosis  . CARDIAC CATHETERIZATION  06/2001, 02/2002, 10/2004    (10-15% proximal stenosis of circumflex, diffuse disease of  OM1 and RCA)  . CARDIAC  CATHETERIZATION  02/21/2017  . COLONOSCOPY    . JOINT REPLACEMENT    . KNEE ARTHROSCOPY Left   . LEFT HEART CATH AND CORONARY ANGIOGRAPHY N/A 02/21/2017   Procedure: LEFT HEART CATH AND CORONARY ANGIOGRAPHY;  Surgeon: Rinaldo CloudHarwani, Mohan, MD;  [facility];  Service: Cardiovascular;  Laterality: N/A;  . TOTAL HIP ARTHROPLASTY Right 11/23/2014   Procedure: RIGHT TOTAL HIP ARTHROPLASTY ANTERIOR APPROACH;  Surgeon: Kathryne Hitchhristopher Y Blackman, MD;  Location: [facility] OR;  Service: Orthopedics;  Laterality: Right;  . TUBAL LIGATION      There were no vitals filed for this visit.  Subjective Assessment - 05/27/17 1209    Subjective  No pain right now.  I overslept the other day.  Took, meds about 1 hr.  ago.      Currently in Pain?  No/denies         [facility] PT Assessment - 05/27/17 0001      AROM   Right Knee Extension  18    Right Knee Flexion  100    Left Knee Extension  0    Left Knee Flexion  97         [facility] Adult PT Treatment/Exercise - 05/27/17 0001  Berg Balance Test   Sit to Stand  Able to stand without using hands and stabilize independently    Standing Unsupported  Able to stand safely 2 minutes    Sitting with Back Unsupported but Feet Supported on Floor or Stool  Able to sit safely and securely 2 minutes    Stand to Sit  Sits safely with minimal use of hands    Transfers  Able to transfer safely, minor use of hands    Standing Unsupported with Eyes Closed  Able to stand 10 seconds with supervision    Standing Ubsupported with Feet Together  Able to place feet together independently and stand for 1 minute with supervision    From Standing, Reach Forward with Outstretched Arm  Can reach forward >5 cm safely (2")    From Standing Position, Pick up Object from Floor  Able to pick up shoe, needs supervision reports a know building under diaphragm when she bends     From Standing Position, Turn to Look Behind Over each Shoulder  Looks behind one side only/other side  shows less weight shift    Turn 360 Degrees  Able to turn 360 degrees safely one side only in 4 seconds or less    Standing Unsupported, Alternately Place Feet on Step/Stool  Able to complete >2 steps/needs minimal assist    Standing Unsupported, One Foot in Front  Needs help to step but can hold 15 seconds    Standing on One Leg  Tries to lift leg/unable to hold 3 seconds but remains standing independently    Total Score  40      High Level Balance   High Level Balance Activities  Side stepping;Backward walking;Head turns;Tandem walking;Marching forwards;Marching backwards    High Level Balance Comments  used cane, tires quickly, stiffness      Knee/Hip Exercises: Aerobic   Nustep  5 min L 3 UE and LE       Knee/Hip Exercises: Standing   Heel Raises  Both;2 sets;10 reps    Hip Abduction  Stengthening;Both;1 set;15 reps;Knee straight    Functional Squat  1 set;10 reps    Functional Squat Limitations  mini squat       Knee/Hip Exercises: Sidelying   Hip ABduction  Strengthening;Both;2 sets;10 reps    Hip ABduction Limitations  Usually harder on the Rt. side (error last visit )     Clams  x 20 each side       Moist Heat Therapy   Number Minutes Moist Heat  10 Minutes    Moist Heat Location  Knee bilat.              PT Education - 05/27/17 1211    Education provided  Yes    Education Details  balance , recruiting proper muscles     Person(s) Educated  Patient    Methods  Explanation    Comprehension  Need further instruction       PT Short Term Goals - 05/27/17 1214      PT SHORT TERM GOAL #1   Title  Pt will be I with initial HEP for knee ROM and strength     Status  On-going      PT SHORT TERM GOAL #2   Title  Pt will complete balance screen and set goal (TUG vs Berg)     Status  Achieved        PT Long Term Goals - 05/27/17 1233  PT LONG TERM GOAL #1   Title  Pt will be I with HEP for LE/knee AROM, strength.      Status  On-going      PT LONG TERM  GOAL #2   Title  Pt will be able to report 20-25%  less pain with mobility, transfers in general.     Status  On-going      PT LONG TERM GOAL #3   Title  Pt will be able to walk 300 feet with RW and no more than min pain in knees.      Baseline  walks with cane , pain min to mod     Status  On-going      PT LONG TERM GOAL #4   Title  Pt will be able to improve balance based on functional screen, Berg to 46/56.     Status  On-going            Plan - 05/27/17 1234    Clinical Impression Statement  Patient with less pain today, able to work in standing but with significant fatigue.  Berg score 40/56 indicating high fall risk, likely safer with a rolling walker. Unable to tolerate any manual to Rt knee.  AROM improved     PT Treatment/Interventions  ADLs/Self Care Home Management;Cryotherapy;Ultrasound;Functional mobility training;Neuromuscular re-education;Taping;Patient/family education;Manual techniques;Therapeutic exercise;Passive range of motion;Therapeutic activities;Electrical Stimulation;Iontophoresis 4mg /ml Dexamethasone;Moist Heat;Gait training;Balance training    PT Next Visit Plan  ROM, try TUG, check strength. Hip strength     PT Home Exercise Plan  LAQ, quad set, knee ROM, knee to chest     Consulted and Agree with Plan of Care  Patient       Patient will benefit from skilled therapeutic intervention in order to improve the following deficits and impairments:  Abnormal gait, Decreased balance, Decreased endurance, Decreased mobility, Hypomobility, Difficulty walking, Obesity, Increased edema, Decreased activity tolerance, Decreased strength, Increased fascial restricitons, Impaired flexibility, Postural dysfunction, Pain  Visit Diagnosis: Other abnormalities of gait and mobility  Muscle weakness (generalized)  Chronic pain of left knee  Chronic pain of right knee  Stiffness of left knee, not elsewhere classified  Stiffness of right knee, not elsewhere  classified     Problem List Patient Active Problem List   Diagnosis Date Noted  . Chronic pain of both knees 04/29/2017  . Unilateral primary osteoarthritis, left knee 04/29/2017  . Unilateral primary osteoarthritis, right knee 04/29/2017  . New-onset angina (HCC) 02/21/2017  . LVH (left ventricular hypertrophy) 08/15/2016  . Status post total replacement of right hip 11/23/2014  . Osteoarthritis of right hip 10/13/2014  . Nocturnal leg cramps 01/06/2014  . Bronchitis 04/25/2012  . DVT of lower extremity, bilateral (HCC) 04/04/2012  . Routine health maintenance 01/14/2012  . GERD (gastroesophageal reflux disease) 01/09/2012  . Urinary incontinence 12/31/2011  . Chronic kidney disease (CKD), stage III (moderate) (HCC) 09/19/2010  . Chronic pelvic pain in female 02/03/2010  . Asthma, chronic 09/27/2009  . Elevated alkaline phosphatase level 08/01/2009  . Pain in joint, multiple sites 12/21/2008  . MAJOR DPRSV DISORDER RECURRENT EPISODE MODERATE 09/08/2008  . Gout 07/05/2008  . Hyperlipidemia 05/07/2006  . Coronary atherosclerosis 02/05/2006  . Morbid obesity (HCC) 12/13/2005  . History of alcohol abuse 12/13/2005  . SLEEP APNEA, OBSTRUCTIVE 12/13/2005  . Essential hypertension 12/13/2005    Vanessa Palmer 05/27/2017, 12:43 PM  [facility] [address] [place], [place], [address] Phone: [phone]   Fax:  [phone]  Name: Vanessa Palmer  Vanessa Palmer MRN: [id number] Date of Birth: 03/26/1950  Karie Mainland, PT 05/27/17 12:44 PM Phone: [phone] Fax: [phone]

## 2017-05-27 NOTE — Telephone Encounter (Signed)
Patient is requesting refills on pain medicine, send to [facility] on [address]

## 2017-05-30 ENCOUNTER — Encounter: Payer: Self-pay | Admitting: Physical Therapy

## 2017-06-03 ENCOUNTER — Ambulatory Visit: Payer: Medicare Other | Admitting: Physical Therapy

## 2017-06-05 DIAGNOSIS — Z5181 Encounter for therapeutic drug level monitoring: Secondary | ICD-10-CM | POA: Diagnosis not present

## 2017-06-07 ENCOUNTER — Encounter: Payer: Self-pay | Admitting: Physical Therapy

## 2017-06-07 ENCOUNTER — Ambulatory Visit: Payer: Medicare Other | Admitting: Physical Therapy

## 2017-06-07 DIAGNOSIS — M25661 Stiffness of right knee, not elsewhere classified: Secondary | ICD-10-CM | POA: Diagnosis not present

## 2017-06-07 DIAGNOSIS — G8929 Other chronic pain: Secondary | ICD-10-CM

## 2017-06-07 DIAGNOSIS — M25561 Pain in right knee: Secondary | ICD-10-CM

## 2017-06-07 DIAGNOSIS — M25562 Pain in left knee: Secondary | ICD-10-CM | POA: Diagnosis not present

## 2017-06-07 DIAGNOSIS — M25662 Stiffness of left knee, not elsewhere classified: Secondary | ICD-10-CM | POA: Diagnosis not present

## 2017-06-07 DIAGNOSIS — R2689 Other abnormalities of gait and mobility: Secondary | ICD-10-CM

## 2017-06-07 DIAGNOSIS — M6281 Muscle weakness (generalized): Secondary | ICD-10-CM | POA: Diagnosis not present

## 2017-06-07 NOTE — Patient Instructions (Signed)
HIP: Hamstrings - Short Sitting    Rest leg on raised surface. Keep knee straight. Lift chest. Hold ___ seconds. ___ reps per set, ___ sets per day, ___ days per week  Copyright  VHI. All rights reserved.   

## 2017-06-07 NOTE — Therapy (Signed)
[facility] [address] [place], [place], [address] Phone: [phone]   Fax:  [phone]  Physical Therapy Treatment  Patient Details  Name: Vanessa Palmer D Fana MRN: [id number] Date of Birth: [birth year] Referring Provider: Dr. Doneen Poissonhristopher Blackman    Encounter Date: 06/07/2017  PT End of Session - 06/07/17 1142    Visit Number  4    Number of Visits  12    Date for PT Re-Evaluation  06/21/17    PT Start Time  1100    PT Stop Time  1143    PT Time Calculation (min)  43 min    Activity Tolerance  Patient tolerated treatment well    Behavior During Therapy  [facility] for tasks assessed/performed       Past Medical History:  Diagnosis Date  . ALCOHOL ABUSE 12/13/2005   Annotation: Sober since 11/06 Qualifier: Diagnosis of  By: Wallace CullensGray MD, Natalia LeatherwoodKatherine    . Anemia   . Arthritis    "all over" (02/21/2017)  . CAD (coronary artery disease)    s/p non-Q wave MI in 06/2001 and 2004, 2005  . CHF (congestive heart failure) (HCC)   . Chronic kidney disease (CKD), stage III (moderate) (HCC) 09/19/2010  . Chronic mid back pain   . Depression   . DJD (degenerative joint disease)   . DVT (deep venous thrombosis) (HCC)    BLE  . DVT of lower extremity, bilateral (HCC) 04/04/2012   On Xarelto    . GERD (gastroesophageal reflux disease)   . Gout   . Hyperlipidemia   . Hypertension   . INTRINSIC ASTHMA, WITH EXACERBATION 09/27/2009   Qualifier: Diagnosis of  By: Denton MeekKarimova MD, Tillie RungNodira    . Migraine    "none anymore" (02/21/2017)  . Myocardial infarction [facility](HCC) ?2005  . Obesity   . OSA (obstructive sleep apnea)    previously used CPAP, has lost it (02/21/2017)  . Seizures (HCC)    As a teenager     Past Surgical History:  Procedure Laterality Date  . ABDOMINAL HYSTERECTOMY     partial; due to endometriosis  . CARDIAC CATHETERIZATION  06/2001, 02/2002, 10/2004    (10-15% proximal stenosis of circumflex, diffuse disease of  OM1 and RCA)  . CARDIAC  CATHETERIZATION  02/21/2017  . COLONOSCOPY    . JOINT REPLACEMENT    . KNEE ARTHROSCOPY Left   . LEFT HEART CATH AND CORONARY ANGIOGRAPHY N/A 02/21/2017   Procedure: LEFT HEART CATH AND CORONARY ANGIOGRAPHY;  Surgeon: Rinaldo CloudHarwani, Mohan, MD;  [facility]: [facility];  Service: Cardiovascular;  Laterality: N/A;  . TOTAL HIP ARTHROPLASTY Right 11/23/2014   Procedure: RIGHT TOTAL HIP ARTHROPLASTY ANTERIOR APPROACH;  Surgeon: Kathryne Hitchhristopher Y Blackman, MD;  Location: [facility] OR;  Service: Orthopedics;  Laterality: Right;  . TUBAL LIGATION      There were no vitals filed for this visit.  Subjective Assessment - 06/07/17 1105    Subjective  8/10 pain.  Walks in with cane.      Currently in Pain?  Yes    Pain Score  8     Pain Location  Knee    Pain Orientation  Right    Pain Descriptors / Indicators  Aching;Throbbing    Pain Type  Chronic pain    Pain Onset  More than a month ago    Pain Frequency  Intermittent    Aggravating Factors   activity     Pain Relieving Factors  meds, rest  [facility] Adult PT Treatment/Exercise - 06/07/17 0001      Self-Care   Other Self-Care Comments   tape, Stim, ROM , hamstring stretch       Knee/Hip Exercises: Stretches   Passive Hamstring Stretch  Right;3 reps    Knee: Self-Stretch to increase Flexion  Right;10 seconds    Knee: Self-Stretch Limitations  x 10       Knee/Hip Exercises: Supine   Quad Sets  Strengthening;Both;1 set    Heel Slides  AAROM;Strengthening;Both;1 set;10 reps      Moist Heat Therapy   Number Minutes Moist Heat  15 Minutes    Moist Heat Location  Knee      Electrical Stimulation   Electrical Stimulation Location  Rt knee     Electrical Stimulation Action  IFC    Electrical Stimulation Parameters  to tol    Electrical Stimulation Goals  Pain      Manual Therapy   Manual Therapy  Taping    Kinesiotex  Facilitate Muscle      Kinesiotix   Facilitate Muscle   quads , 2 Y strips Rt knee              PT  Education - 06/07/17 1142    Education provided  Yes    Education Details  IFC/ home TENS , tape     Person(s) Educated  Patient    Methods  Explanation    Comprehension  Verbalized understanding       PT Short Term Goals - 05/27/17 1214      PT SHORT TERM GOAL #1   Title  Pt will be I with initial HEP for knee ROM and strength     Status  On-going      PT SHORT TERM GOAL #2   Title  Pt will complete balance screen and set goal (TUG vs Berg)     Status  Achieved        PT Long Term Goals - 05/27/17 1233      PT LONG TERM GOAL #1   Title  Pt will be I with HEP for LE/knee AROM, strength.      Status  On-going      PT LONG TERM GOAL #2   Title  Pt will be able to report 20-25%  less pain with mobility, transfers in general.     Status  On-going      PT LONG TERM GOAL #3   Title  Pt will be able to walk 300 feet with RW and no more than min pain in knees.      Baseline  walks with cane , pain min to mod     Status  On-going      PT LONG TERM GOAL #4   Title  Pt will be able to improve balance based on functional screen, Berg to 46/56.     Status  On-going            Plan - 06/07/17 1106    Clinical Impression Statement  Patient with increased Rt knee today.  Unable to extend comfortably.  Focused on pain control utilizing IFC and tape to activate quads.      PT Treatment/Interventions  ADLs/Self Care Home Management;Cryotherapy;Ultrasound;Functional mobility training;Neuromuscular re-education;Taping;Patient/family education;Manual techniques;Therapeutic exercise;Passive range of motion;Therapeutic activities;Electrical Stimulation;Iontophoresis 4mg /ml Dexamethasone;Moist Heat;Gait training;Balance training    PT Next Visit Plan  how was tape? IFC? repeat ? ROM, try TUG, check strength. Hip strength     PT Home  Exercise Plan  LAQ, quad set, knee ROM, knee to chest     Consulted and Agree with Plan of Care  Patient       Patient will benefit from skilled  therapeutic intervention in order to improve the following deficits and impairments:  Abnormal gait, Decreased balance, Decreased endurance, Decreased mobility, Hypomobility, Difficulty walking, Obesity, Increased edema, Decreased activity tolerance, Decreased strength, Increased fascial restricitons, Impaired flexibility, Postural dysfunction, Pain  Visit Diagnosis: Other abnormalities of gait and mobility  Muscle weakness (generalized)  Chronic pain of left knee  Chronic pain of right knee  Stiffness of left knee, not elsewhere classified  Stiffness of right knee, not elsewhere classified     Problem List Patient Active Problem List   Diagnosis Date Noted  . Chronic pain of both knees 04/29/2017  . Unilateral primary osteoarthritis, left knee 04/29/2017  . Unilateral primary osteoarthritis, right knee 04/29/2017  . New-onset angina (HCC) 02/21/2017  . LVH (left ventricular hypertrophy) 08/15/2016  . Status post total replacement of right hip 11/23/2014  . Osteoarthritis of right hip 10/13/2014  . Nocturnal leg cramps 01/06/2014  . Bronchitis 04/25/2012  . DVT of lower extremity, bilateral (HCC) 04/04/2012  . Routine health maintenance 01/14/2012  . GERD (gastroesophageal reflux disease) 01/09/2012  . Urinary incontinence 12/31/2011  . Chronic kidney disease (CKD), stage III (moderate) (HCC) 09/19/2010  . Chronic pelvic pain in female 02/03/2010  . Asthma, chronic 09/27/2009  . Elevated alkaline phosphatase level 08/01/2009  . Pain in joint, multiple sites 12/21/2008  . MAJOR DPRSV DISORDER RECURRENT EPISODE MODERATE 09/08/2008  . Gout 07/05/2008  . Hyperlipidemia 05/07/2006  . Coronary atherosclerosis 02/05/2006  . Morbid obesity (HCC) 12/13/2005  . History of alcohol abuse 12/13/2005  . SLEEP APNEA, OBSTRUCTIVE 12/13/2005  . Essential hypertension 12/13/2005    PAA,JENNIFER 06/07/2017, 11:52 AM  [facility] [address] [place], [place], [address] Phone: [phone]   Fax:  [phone]  Name: JAYLYNE BREESE MRN: [id number] Date of Birth: May 31, 1950   Karie Mainland, PT 06/07/17 11:52 AM Phone: [phone] Fax: [phone]

## 2017-06-13 ENCOUNTER — Other Ambulatory Visit: Payer: Self-pay | Admitting: Internal Medicine

## 2017-06-13 DIAGNOSIS — I82403 Acute embolism and thrombosis of unspecified deep veins of lower extremity, bilateral: Secondary | ICD-10-CM

## 2017-06-18 ENCOUNTER — Ambulatory Visit: Payer: Medicare Other | Admitting: Physical Therapy

## 2017-06-18 ENCOUNTER — Encounter: Payer: Self-pay | Admitting: Physical Therapy

## 2017-06-18 DIAGNOSIS — M25562 Pain in left knee: Secondary | ICD-10-CM | POA: Diagnosis not present

## 2017-06-18 DIAGNOSIS — M6281 Muscle weakness (generalized): Secondary | ICD-10-CM | POA: Diagnosis not present

## 2017-06-18 DIAGNOSIS — M25661 Stiffness of right knee, not elsewhere classified: Secondary | ICD-10-CM | POA: Diagnosis not present

## 2017-06-18 DIAGNOSIS — R2689 Other abnormalities of gait and mobility: Secondary | ICD-10-CM | POA: Diagnosis not present

## 2017-06-18 DIAGNOSIS — G8929 Other chronic pain: Secondary | ICD-10-CM

## 2017-06-18 DIAGNOSIS — M25561 Pain in right knee: Secondary | ICD-10-CM | POA: Diagnosis not present

## 2017-06-18 DIAGNOSIS — M25662 Stiffness of left knee, not elsewhere classified: Secondary | ICD-10-CM | POA: Diagnosis not present

## 2017-06-18 NOTE — Therapy (Signed)
[facility] [address] [place], [place], [address] Phone: [phone]   Fax:  [phone]  Physical Therapy Treatment  Patient Details  Name: Vanessa Palmer MRN: [id number] Date of Birth: 1950-04-05 Referring Provider: Dr. Doneen Poissonhristopher Blackman    Encounter Date: 06/18/2017  PT End of Session - 06/18/17 1427    Visit Number  5    Number of Visits  12    Date for PT Re-Evaluation  06/21/17    PT Start Time  1331    PT Stop Time  1435    PT Time Calculation (min)  64 min    Activity Tolerance  Patient tolerated treatment well    Behavior During Therapy  [facility] for tasks assessed/performed       Past Medical History:  Diagnosis Date  . ALCOHOL ABUSE 12/13/2005   Annotation: Sober since 11/06 Qualifier: Diagnosis of  By: Wallace CullensGray MD, Natalia LeatherwoodKatherine    . Anemia   . Arthritis    "all over" (02/21/2017)  . CAD (coronary artery disease)    s/p non-Q wave MI in 06/2001 and 2004, 2005  . CHF (congestive heart failure) (HCC)   . Chronic kidney disease (CKD), stage III (moderate) (HCC) 09/19/2010  . Chronic mid back pain   . Depression   . DJD (degenerative joint disease)   . DVT (deep venous thrombosis) (HCC)    BLE  . DVT of lower extremity, bilateral (HCC) 04/04/2012   On Xarelto    . GERD (gastroesophageal reflux disease)   . Gout   . Hyperlipidemia   . Hypertension   . INTRINSIC ASTHMA, WITH EXACERBATION 09/27/2009   Qualifier: Diagnosis of  By: Denton MeekKarimova MD, Tillie RungNodira    . Migraine    "none anymore" (02/21/2017)  . Myocardial infarction [facility](HCC) ?2005  . Obesity   . OSA (obstructive sleep apnea)    previously used CPAP, has lost it (02/21/2017)  . Seizures (HCC)    As a teenager     Past Surgical History:  Procedure Laterality Date  . ABDOMINAL HYSTERECTOMY     partial; due to endometriosis  . CARDIAC CATHETERIZATION  06/2001, 02/2002, 10/2004    (10-15% proximal stenosis of circumflex, diffuse disease of  OM1 and RCA)  . CARDIAC  CATHETERIZATION  02/21/2017  . COLONOSCOPY    . JOINT REPLACEMENT    . KNEE ARTHROSCOPY Left   . LEFT HEART CATH AND CORONARY ANGIOGRAPHY N/A 02/21/2017   Procedure: LEFT HEART CATH AND CORONARY ANGIOGRAPHY;  Surgeon: Rinaldo CloudHarwani, Mohan, MD;  [facility]: [facility];  Service: Cardiovascular;  Laterality: N/A;  . TOTAL HIP ARTHROPLASTY Right 11/23/2014   Procedure: RIGHT TOTAL HIP ARTHROPLASTY ANTERIOR APPROACH;  Surgeon: Kathryne Hitchhristopher Y Blackman, MD;  Location: [facility] OR;  Service: Orthopedics;  Laterality: Right;  . TUBAL LIGATION      There were no vitals filed for this visit.  Subjective Assessment - 06/18/17 1336    Subjective  Tape and machine helped.  I have been doing the exercises.  Heel slides and  sitting lifts.  Elecric wheel chair    Currently in Pain?  Yes    Pain Score  6     Pain Location  Knee    Pain Orientation  Right;Anterior;Medial;Lateral Left 4/10    Pain Descriptors / Indicators  Aching;Throbbing;Cramping    Pain Frequency  Constant    Aggravating Factors   activity    Pain Relieving Factors  meds and rest,  tape ,, IFC    Effect  of Pain on Daily Activities  Needs electric wheelchair.     Multiple Pain Sites  -- Has back pain at times ,  Long standing worse with bad weather.                         [facility] Adult PT Treatment/Exercise - 06/18/17 0001      Knee/Hip Exercises: Stretches   Gastroc Stretch  3 reps;30 seconds      Knee/Hip Exercises: Seated   Long Arc Quad Limitations  8 X 10 seconds stopped due to 8/10 pain  no obvious physical signs of pain    Heel Slides  15 reps foot on pillow case    Hamstring Curl  10 reps;2 sets yellow band      Knee/Hip Exercises: Supine   Quad Sets  5 reps    Short Arc Quad Sets  1 set;10 reps small end range movement.    Straight Leg Raises  AAROM;10 reps green strap    Patellar Mobs  yes concurrent with moist heat.  stiff.  mild increase in mobili      Moist Heat Therapy   Number Minutes Moist Heat  --  15 minutes post session and 5 minutes during patellar mobs.    Moist Heat Location  Knee      Electrical Stimulation   Electrical Stimulation Location  Right knee    Electrical Stimulation Action  IFC    Electrical Stimulation Parameters  6    Electrical Stimulation Goals  Pain      Manual Therapy   Manual Therapy  Taping    Manual therapy comments  Also retrograde soft tissue thigh to decrease pain,  medial knee pain reduced to 7/10      Kinesiotix   Inhibit Muscle   anterior tib    Facilitate Muscle   -- pants too tight for quads so did not do             PT Education - 06/18/17 1426    Education provided  Yes    Education Details  Elevation to help edems,  use of heat to help pain    Person(s) Educated  Patient    Methods  Explanation;Demonstration    Comprehension  Verbalized understanding       PT Short Term Goals - 05/27/17 1214      PT SHORT TERM GOAL #1   Title  Pt will be I with initial HEP for knee ROM and strength     Status  On-going      PT SHORT TERM GOAL #2   Title  Pt will complete balance screen and set goal (TUG vs Berg)     Status  Achieved        PT Long Term Goals - 05/27/17 1233      PT LONG TERM GOAL #1   Title  Pt will be I with HEP for LE/knee AROM, strength.      Status  On-going      PT LONG TERM GOAL #2   Title  Pt will be able to report 20-25%  less pain with mobility, transfers in general.     Status  On-going      PT LONG TERM GOAL #3   Title  Pt will be able to walk 300 feet with RW and no more than min pain in knees.      Baseline  walks with cane , pain min to mod  Status  On-going      PT LONG TERM GOAL #4   Title  Pt will be able to improve balance based on functional screen, Berg to 46/56.     Status  On-going            Plan - 06/18/17 1428    Clinical Impression Statement  90 degrees AROM right knee.  Pain 7/10 prior to modalities.  Patella sriff and is probably contributing to patient's ain and  function.  She likes some of her HEP.  IFC helped her pain for the rest of the day last visit.     PT Next Visit Plan   She will wear loose clothes so we can tape. Continue IFC  ROM, try TUG, check strength. Hip strength     PT Home Exercise Plan  LAQ, quad set, knee ROM, knee to chest     Consulted and Agree with Plan of Care  Patient       Patient will benefit from skilled therapeutic intervention in order to improve the following deficits and impairments:     Visit Diagnosis: Other abnormalities of gait and mobility  Muscle weakness (generalized)  Chronic pain of left knee     Problem List Patient Active Problem List   Diagnosis Date Noted  . Chronic pain of both knees 04/29/2017  . Unilateral primary osteoarthritis, left knee 04/29/2017  . Unilateral primary osteoarthritis, right knee 04/29/2017  . New-onset angina (HCC) 02/21/2017  . LVH (left ventricular hypertrophy) 08/15/2016  . Status post total replacement of right hip 11/23/2014  . Osteoarthritis of right hip 10/13/2014  . Nocturnal leg cramps 01/06/2014  . Bronchitis 04/25/2012  . DVT of lower extremity, bilateral (HCC) 04/04/2012  . Routine health maintenance 01/14/2012  . GERD (gastroesophageal reflux disease) 01/09/2012  . Urinary incontinence 12/31/2011  . Chronic kidney disease (CKD), stage III (moderate) (HCC) 09/19/2010  . Chronic pelvic pain in female 02/03/2010  . Asthma, chronic 09/27/2009  . Elevated alkaline phosphatase level 08/01/2009  . Pain in joint, multiple sites 12/21/2008  . MAJOR DPRSV DISORDER RECURRENT EPISODE MODERATE 09/08/2008  . Gout 07/05/2008  . Hyperlipidemia 05/07/2006  . Coronary atherosclerosis 02/05/2006  . Morbid obesity (HCC) 12/13/2005  . History of alcohol abuse 12/13/2005  . SLEEP APNEA, OBSTRUCTIVE 12/13/2005  . Essential hypertension 12/13/2005    HARRIS,KAREN  PTA 06/18/2017, 2:34 PM  [facility] [address] [place], [place], [address] Phone: [phone]   Fax:  [phone]  Name: Vanessa Palmer MRN: [id number] Date of Birth: Jul 18, 1950

## 2017-06-24 ENCOUNTER — Encounter: Payer: Self-pay | Admitting: Physical Therapy

## 2017-06-24 ENCOUNTER — Other Ambulatory Visit: Payer: Self-pay | Admitting: Internal Medicine

## 2017-06-24 ENCOUNTER — Ambulatory Visit: Payer: Medicare Other | Admitting: Physical Therapy

## 2017-06-24 DIAGNOSIS — M25561 Pain in right knee: Secondary | ICD-10-CM

## 2017-06-24 DIAGNOSIS — M25662 Stiffness of left knee, not elsewhere classified: Secondary | ICD-10-CM | POA: Diagnosis not present

## 2017-06-24 DIAGNOSIS — R2689 Other abnormalities of gait and mobility: Secondary | ICD-10-CM | POA: Diagnosis not present

## 2017-06-24 DIAGNOSIS — M25661 Stiffness of right knee, not elsewhere classified: Secondary | ICD-10-CM

## 2017-06-24 DIAGNOSIS — M25562 Pain in left knee: Secondary | ICD-10-CM | POA: Diagnosis not present

## 2017-06-24 DIAGNOSIS — M6281 Muscle weakness (generalized): Secondary | ICD-10-CM

## 2017-06-24 DIAGNOSIS — G8929 Other chronic pain: Secondary | ICD-10-CM | POA: Diagnosis not present

## 2017-06-24 DIAGNOSIS — M2550 Pain in unspecified joint: Secondary | ICD-10-CM

## 2017-06-24 MED ORDER — OXYCODONE-ACETAMINOPHEN 10-325 MG PO TABS: 1.0000 | ORAL_TABLET | Freq: Three times a day (TID) | ORAL | 0 refills | Status: DC | PRN

## 2017-06-24 NOTE — Telephone Encounter (Signed)
NEEDS REFILL ON PAIN MEDICATION, PERCOCET , [place], [place]

## 2017-06-24 NOTE — Therapy (Signed)
[facility] [address] [place], [place], [address] Phone: [phone]   Fax:  [phone]  Physical Therapy Treatment  Patient Details  Name: Vanessa Palmer MRN: [id number] Date of Birth: October 06, 1950 Referring Provider: Dr. Doneen Poisson    Encounter Date: 06/24/2017  PT End of Session - 06/24/17 1830    Visit Number  6    Number of Visits  12    Date for PT Re-Evaluation  06/21/17    PT Start Time  1103    PT Stop Time  1200    PT Time Calculation (min)  57 min    Activity Tolerance  Patient tolerated treatment well    Behavior During Therapy  [facility] for tasks assessed/performed       Past Medical History:  Diagnosis Date  . ALCOHOL ABUSE 12/13/2005   Annotation: Sober since 11/06 Qualifier: Diagnosis of  By: Wallace Cullens MD, Natalia Leatherwood    . Anemia   . Arthritis    "all over" (02/21/2017)  . CAD (coronary artery disease)    s/p non-Q wave MI in 06/2001 and 2004, 2005  . CHF (congestive heart failure) (HCC)   . Chronic kidney disease (CKD), stage III (moderate) (HCC) 09/19/2010  . Chronic mid back pain   . Depression   . DJD (degenerative joint disease)   . DVT (deep venous thrombosis) (HCC)    BLE  . DVT of lower extremity, bilateral (HCC) 04/04/2012   On Xarelto    . GERD (gastroesophageal reflux disease)   . Gout   . Hyperlipidemia   . Hypertension   . INTRINSIC ASTHMA, WITH EXACERBATION 09/27/2009   Qualifier: Diagnosis of  By: Denton Meek MD, Tillie Rung    . Migraine    "none anymore" (02/21/2017)  . Myocardial infarction [facility]) ?2005  . Obesity   . OSA (obstructive sleep apnea)    previously used CPAP, has lost it (02/21/2017)  . Seizures (HCC)    As a teenager     Past Surgical History:  Procedure Laterality Date  . ABDOMINAL HYSTERECTOMY     partial; due to endometriosis  . CARDIAC CATHETERIZATION  06/2001, 02/2002, 10/2004    (10-15% proximal stenosis of circumflex, diffuse disease of  OM1 and RCA)  . CARDIAC  CATHETERIZATION  02/21/2017  . COLONOSCOPY    . JOINT REPLACEMENT    . KNEE ARTHROSCOPY Left   . LEFT HEART CATH AND CORONARY ANGIOGRAPHY N/A 02/21/2017   Procedure: LEFT HEART CATH AND CORONARY ANGIOGRAPHY;  Surgeon: Rinaldo Cloud, MD;  [facility];  Service: Cardiovascular;  Laterality: N/A;  . TOTAL HIP ARTHROPLASTY Right 11/23/2014   Procedure: RIGHT TOTAL HIP ARTHROPLASTY ANTERIOR APPROACH;  Surgeon: Kathryne Hitch, MD;  Location: [facility] OR;  Service: Orthopedics;  Laterality: Right;  . TUBAL LIGATION      There were no vitals filed for this visit.  Subjective Assessment - 06/24/17 1111    Subjective    Sore  .  i am doing the exercises.  She says her pain is not as bad.  She uses the cane in house and to church.  Uses wheelchair shen she needs to do longer walking.      Currently in Pain?  Yes    Pain Score  2     Pain Location  Knee    Pain Orientation  Right    Pain Descriptors / Indicators  Aching;Throbbing;Cramping    Aggravating Factors   sitting too long.  walkinghurts sometimes  Pain Relieving Factors  use of wheel chair,  meds, tape  IFC    Pain Score  2    Pain Location  Knee    Pain Orientation  Left    Pain Descriptors / Indicators  Throbbing;Aching;Cramping    Pain Type  Chronic pain    Aggravating Factors   see above    Pain Relieving Factors  see above         [facility] PT Assessment - 06/24/17 0001      Ambulation/Gait   Gait Comments  TUG 23 seconds no assist device 7/10                   [facility] Adult PT Treatment/Exercise - 06/24/17 0001      Knee/Hip Exercises: Aerobic   Nustep  6 minutes  L4      Knee/Hip Exercises: Seated   Sit to Sand  -- 3 reps needed cues, painful      Knee/Hip Exercises: Supine   Quad Sets  10 reps    Short Arc Quad Sets  10 reps    Heel Slides  10 reps painful    Patellar Mobs  yes mobility improving      Moist Heat Therapy   Number Minutes Moist Heat  15 Minutes    Moist Heat Location   Knee      Electrical Stimulation   Electrical Stimulation Location  right knee hamstrings    Electrical Stimulation Action  IFC    Electrical Stimulation Parameters  6    Electrical Stimulation Goals  Pain      Manual Therapy   Manual Therapy  Taping    Manual therapy comments  Also retrograde soft tissue thigh to decrease pain,  medial knee pain reduced to 7/10 medial hamstrings softened,  light tolersated, PROM ext      Kinesiotix   Inhibit Muscle   anterior tib    Facilitate Muscle   quads and 2 fans for edema medial lateral              PT Education - 06/24/17 1830    Education provided  Yes    Education Details  exercise form    Person(s) Educated  Patient    Methods  Explanation;Verbal cues    Comprehension  Verbalized understanding       PT Short Term Goals - 06/24/17 1833      PT SHORT TERM GOAL #1   Title  Pt will be I with initial HEP for knee ROM and strength     Baseline  minor cues    Time  2    Period  Weeks    Status  On-going      PT SHORT TERM GOAL #2   Title  Pt will complete balance screen and set goal (TUG vs Berg)     Baseline  TUG 23 seconds    Time  2    Period  Weeks    Status  Achieved      PT SHORT TERM GOAL #3   Title  Pt will report less pain in Rt. LE (25% less) with ADLs.     Baseline  pain varies not yet consistantly 25%    Time  4    Period  Weeks    Status  On-going        PT Long Term Goals - 05/27/17 1233      PT LONG TERM GOAL #1   Title  Pt will  be I with HEP for LE/knee AROM, strength.      Status  On-going      PT LONG TERM GOAL #2   Title  Pt will be able to report 20-25%  less pain with mobility, transfers in general.     Status  On-going      PT LONG TERM GOAL #3   Title  Pt will be able to walk 300 feet with RW and no more than min pain in knees.      Baseline  walks with cane , pain min to mod     Status  On-going      PT LONG TERM GOAL #4   Title  Pt will be able to improve balance based on  functional screen, Berg to 46/56.     Status  On-going            Plan - 06/24/17 1832    Clinical Impression Statement  TUG 23 seconds with 7/10 pain.  Manual and taping continue to be helpful.  She has been able [employer] with using cane vs electric chair.      PT Next Visit Plan   Assess tape. Continue IFC  ROM, , check strength. Hip strength functional strengthening,  update HEP    PT Home Exercise Plan  LAQ, quad set, knee ROM, knee to chest     Consulted and Agree with Plan of Care  Patient       Patient will benefit from skilled therapeutic intervention in order to improve the following deficits and impairments:     Visit Diagnosis: Other abnormalities of gait and mobility  Muscle weakness (generalized)  Chronic pain of left knee  Chronic pain of right knee  Stiffness of left knee, not elsewhere classified  Stiffness of right knee, not elsewhere classified     Problem List Patient Active Problem List   Diagnosis Date Noted  . Chronic pain of both knees 04/29/2017  . Unilateral primary osteoarthritis, left knee 04/29/2017  . Unilateral primary osteoarthritis, right knee 04/29/2017  . New-onset angina (HCC) 02/21/2017  . LVH (left ventricular hypertrophy) 08/15/2016  . Status post total replacement of right hip 11/23/2014  . Osteoarthritis of right hip 10/13/2014  . Nocturnal leg cramps 01/06/2014  . Bronchitis 04/25/2012  . DVT of lower extremity, bilateral (HCC) 04/04/2012  . Routine health maintenance 01/14/2012  . GERD (gastroesophageal reflux disease) 01/09/2012  . Urinary incontinence 12/31/2011  . Chronic kidney disease (CKD), stage III (moderate) (HCC) 09/19/2010  . Chronic pelvic pain in female 02/03/2010  . Asthma, chronic 09/27/2009  . Elevated alkaline phosphatase level 08/01/2009  . Pain in joint, multiple sites 12/21/2008  . MAJOR DPRSV DISORDER RECURRENT EPISODE MODERATE 09/08/2008  . Gout 07/05/2008  . Hyperlipidemia 05/07/2006  .  Coronary atherosclerosis 02/05/2006  . Morbid obesity (HCC) 12/13/2005  . History of alcohol abuse 12/13/2005  . SLEEP APNEA, OBSTRUCTIVE 12/13/2005  . Essential hypertension 12/13/2005    Nidia Grogan PTA 06/24/2017, 6:35 PM  [facility] [address] [place], [place], [address] Phone: [phone]   Fax:  [phone]  Name: AJOONI KARAM MRN: [id number] Date of Birth: [birth year]

## 2017-06-26 ENCOUNTER — Ambulatory Visit: Payer: Medicare Other | Attending: Orthopaedic Surgery | Admitting: Physical Therapy

## 2017-06-26 ENCOUNTER — Encounter: Payer: Self-pay | Admitting: Physical Therapy

## 2017-06-26 DIAGNOSIS — G8929 Other chronic pain: Secondary | ICD-10-CM | POA: Diagnosis not present

## 2017-06-26 DIAGNOSIS — R2689 Other abnormalities of gait and mobility: Secondary | ICD-10-CM | POA: Diagnosis not present

## 2017-06-26 DIAGNOSIS — M25561 Pain in right knee: Secondary | ICD-10-CM | POA: Diagnosis not present

## 2017-06-26 DIAGNOSIS — M25662 Stiffness of left knee, not elsewhere classified: Secondary | ICD-10-CM | POA: Insufficient documentation

## 2017-06-26 DIAGNOSIS — M6281 Muscle weakness (generalized): Secondary | ICD-10-CM | POA: Insufficient documentation

## 2017-06-26 DIAGNOSIS — M25562 Pain in left knee: Secondary | ICD-10-CM | POA: Insufficient documentation

## 2017-06-26 DIAGNOSIS — M25661 Stiffness of right knee, not elsewhere classified: Secondary | ICD-10-CM | POA: Diagnosis not present

## 2017-06-26 MED ORDER — OXYCODONE-ACETAMINOPHEN 10-325 MG PO TABS: 1.0000 | ORAL_TABLET | Freq: Three times a day (TID) | ORAL | 0 refills | Status: DC | PRN

## 2017-06-26 NOTE — Therapy (Signed)
[facility] [address] [place], [place], [address] Phone: [phone]   Fax:  [phone]  Physical Therapy Treatment/Renewal  Patient Details  Name: Vanessa Palmer MRN: [id number] Date of Birth: 1950/12/30 Referring Provider: Dr. Doneen Poisson    Encounter Date: 06/26/2017  PT End of Session - 06/26/17 1255    Visit Number  7    Number of Visits  15    Date for PT Re-Evaluation  07/26/17 5 weeks     PT Start Time  1015    PT Stop Time  1106    PT Time Calculation (min)  51 min    Activity Tolerance  Patient tolerated treatment well    Behavior During Therapy  [facility] for tasks assessed/performed       Past Medical History:  Diagnosis Date  . ALCOHOL ABUSE 12/13/2005   Annotation: Sober since 11/06 Qualifier: Diagnosis of  By: Wallace Cullens MD, Natalia Leatherwood    . Anemia   . Arthritis    "all over" (02/21/2017)  . CAD (coronary artery disease)    s/p non-Q wave MI in 06/2001 and 2004, 2005  . CHF (congestive heart failure) (HCC)   . Chronic kidney disease (CKD), stage III (moderate) (HCC) 09/19/2010  . Chronic mid back pain   . Depression   . DJD (degenerative joint disease)   . DVT (deep venous thrombosis) (HCC)    BLE  . DVT of lower extremity, bilateral (HCC) 04/04/2012   On Xarelto    . GERD (gastroesophageal reflux disease)   . Gout   . Hyperlipidemia   . Hypertension   . INTRINSIC ASTHMA, WITH EXACERBATION 09/27/2009   Qualifier: Diagnosis of  By: Denton Meek MD, Tillie Rung    . Migraine    "none anymore" (02/21/2017)  . Myocardial infarction [facility]) ?2005  . Obesity   . OSA (obstructive sleep apnea)    previously used CPAP, has lost it (02/21/2017)  . Seizures (HCC)    As a teenager     Past Surgical History:  Procedure Laterality Date  . ABDOMINAL HYSTERECTOMY     partial; due to endometriosis  . CARDIAC CATHETERIZATION  06/2001, 02/2002, 10/2004    (10-15% proximal stenosis of circumflex, diffuse disease of  OM1 and  RCA)  . CARDIAC CATHETERIZATION  02/21/2017  . COLONOSCOPY    . JOINT REPLACEMENT    . KNEE ARTHROSCOPY Left   . LEFT HEART CATH AND CORONARY ANGIOGRAPHY N/A 02/21/2017   Procedure: LEFT HEART CATH AND CORONARY ANGIOGRAPHY;  Surgeon: Rinaldo Cloud, MD;  [facility];  Service: Cardiovascular;  Laterality: N/A;  . TOTAL HIP ARTHROPLASTY Right 11/23/2014   Procedure: RIGHT TOTAL HIP ARTHROPLASTY ANTERIOR APPROACH;  Surgeon: Kathryne Hitch, MD;  Location: [facility] OR;  Service: Orthopedics;  Laterality: Right;  . TUBAL LIGATION      There were no vitals filed for this visit.  Subjective Assessment - 06/26/17 1019    Subjective  The R one is worse than the L .  Its thumping today.     Currently in Pain?  Yes    Pain Score  7     Pain Location  Knee    Pain Orientation  Right    Pain Descriptors / Indicators  Throbbing    Pain Type  Chronic pain    Pain Onset  More than a month ago    Pain Frequency  Constant    Multiple Pain Sites  Yes  [facility] PT Assessment - 06/26/17 0001      Circumferential Edema   Circumferential - Right  16 inch at joint line     Circumferential - Left   16 .25 at joint line       AROM   Right Knee Extension  17    Right Knee Flexion  95    Left Knee Extension  0    Left Knee Flexion  95      Strength   Right Hip Flexion  3+/5    Right Knee Flexion  4+/5    Right Knee Extension  4/5    Left Knee Flexion  5/5    Left Knee Extension  4+/5      Palpation   Palpation comment  does not tolerate on Rt LE           [facility] Adult PT Treatment/Exercise - 06/26/17 0001      Ambulation/Gait   Ambulation Distance (Feet)  240 Feet    Assistive device  Straight cane    Gait Pattern  Antalgic    Ambulation Surface  Level;Indoor    Gait Comments  worked on endurance and reducing limp       Knee/Hip Exercises: Programme researcher, broadcasting/film/video  3 reps;30 seconds    Knee: Self-Stretch to increase Flexion  Both;5 reps    Knee:  Self-Stretch Limitations  used sheet x 30       Knee/Hip Exercises: Seated   Long Arc Quad  Strengthening;Both;1 set;20 reps;Weights    Long Arc Quad Weight  4 lbs.    Hamstring Curl  Both;1 set;20 reps    Hamstring Limitations  green band      Electrical Stimulation   Electrical Stimulation Location  right knee hamstrings    Electrical Stimulation Action  IFC     Electrical Stimulation Parameters  7.5     Electrical Stimulation Goals  Pain             PT Education - 06/26/17 1253    Education provided  Yes    Education Details  ROM deficits , strength gains     Person(s) Educated  Patient    Methods  Explanation;Handout    Comprehension  Verbalized understanding;Verbal cues required;Tactile cues required       PT Short Term Goals - 06/26/17 1022      PT SHORT TERM GOAL #1   Title  Pt will be I with initial HEP for knee ROM and strength     Baseline  cues needed    Status  On-going      PT SHORT TERM GOAL #2   Title  Pt will complete balance screen and set goal (TUG vs Berg)     Status  Achieved      PT SHORT TERM GOAL #3   Title  Pt will report less pain in Rt. LE (25% less) with ADLs.     Baseline  pain varies not yet consistantly 25%    Status  On-going        PT Long Term Goals - 06/26/17 1022      PT LONG TERM GOAL #1   Title  Pt will be I with HEP for LE/knee AROM, strength.      Status  On-going      PT LONG TERM GOAL #2   Title  Pt will be able to report 20-25%  less pain with mobility, transfers in general.     Baseline  pain mod to severe most of the time    Status  On-going      PT LONG TERM GOAL #3   Title  Pt will be able to walk 300 feet with RW or cane and no more than min pain in knees.      Status  On-going      PT LONG TERM GOAL #4   Title  Pt will be able to improve balance based on functional screen, Berg to 46/56.     Status  On-going      PT LONG TERM GOAL #5   Title  TUG score will improve to 18 sec     Time  3    Period   Weeks    Status  New    Target Date  07/24/17            Plan - 06/26/17 1256    Clinical Impression Statement  Patient has completed 7 sessions. She has limited A/AROM and weakness.  Did not take pain meds today.  Her Rt knee lacks 15-20 deg extension.  She will be discharged unless she shows functional improvement in strength, ROM and endurance in 6-8 more visits.     PT Treatment/Interventions  ADLs/Self Care Home Management;Cryotherapy;Ultrasound;Functional mobility training;Neuromuscular re-education;Taping;Patient/family education;Manual techniques;Therapeutic exercise;Passive range of motion;Therapeutic activities;Electrical Stimulation;Iontophoresis /ml Dexamethasone;Moist Heat;Gait training;Balance training    PT Next Visit Plan  Continue IFC/tape  ROM, , check strength. Hip strength functional strengthening,      PT Home Exercise Plan  LAQ, quad set, knee ROM, knee to chest (reissued today), hamstring stretch     Consulted and Agree with Plan of Care  Patient       Patient will benefit from skilled therapeutic intervention in order to improve the following deficits and impairments:  Abnormal gait, Decreased balance, Decreased endurance, Decreased mobility, Hypomobility, Difficulty walking, Obesity, Increased edema, Decreased activity tolerance, Decreased strength, Increased fascial restricitons, Impaired flexibility, Postural dysfunction, Pain  Visit Diagnosis: Other abnormalities of gait and mobility  Muscle weakness (generalized)  Chronic pain of left knee  Chronic pain of right knee  Stiffness of left knee, not elsewhere classified  Stiffness of right knee, not elsewhere classified     Problem List Patient Active Problem List   Diagnosis Date Noted  . Chronic pain of both knees 04/29/2017  . Unilateral primary osteoarthritis, left knee 04/29/2017  . Unilateral primary osteoarthritis, right knee 04/29/2017  . New-onset angina (HCC) 02/21/2017  . LVH (left  ventricular hypertrophy) 08/15/2016  . Status post total replacement of right hip 11/23/2014  . Osteoarthritis of right hip 10/13/2014  . Nocturnal leg cramps 01/06/2014  . Bronchitis 04/25/2012  . DVT of lower extremity, bilateral (HCC) 04/04/2012  . Routine health maintenance 01/14/2012  . GERD (gastroesophageal reflux disease) 01/09/2012  . Urinary incontinence 12/31/2011  . Chronic kidney disease (CKD), stage III (moderate) (HCC) 09/19/2010  . Chronic pelvic pain in female 02/03/2010  . Asthma, chronic 09/27/2009  . Elevated alkaline phosphatase level 08/01/2009  . Pain in joint, multiple sites 12/21/2008  . MAJOR DPRSV DISORDER RECURRENT EPISODE MODERATE 09/08/2008  . Gout 07/05/2008  . Hyperlipidemia 05/07/2006  . Coronary atherosclerosis 02/05/2006  . Morbid obesity (HCC) 12/13/2005  . History of alcohol abuse 12/13/2005  . SLEEP APNEA, OBSTRUCTIVE 12/13/2005  . Essential hypertension 12/13/2005    Vanessa Palmer 06/26/2017, 1:19 PM  [facility] [address] [place], [place], [address] Phone: [phone]   Fax:  [phone]  Name:  Vanessa Palmer MRN: [id number] Date of Birth: [birth year]  Karie Mainland, PT 06/26/17 1:19 PM Phone: [phone] Fax: [phone]

## 2017-06-26 NOTE — Telephone Encounter (Signed)
Defaults to print even though I have selected "Normal" X 2 separate occasions.  Computer people need to fix.  Written prescription was given to [facility] when first prescribed.  I will not make another attempt (third) to refill electronically until computer people have fixed the issue

## 2017-06-26 NOTE — Telephone Encounter (Signed)
Print was selected on Rx for oxycodone on 06/24/2017. This med cannot be phoned in. Will send to Attending Pool to send electronically. Patient notified. Kinnie Feil, RN, BSN

## 2017-06-26 NOTE — Telephone Encounter (Signed)
Requesting to speak with a nurse. Please call pt back.  

## 2017-06-26 NOTE — Telephone Encounter (Signed)
Pt states oxyCODONE-acetaminophen (PERCOCET) 10-325 MG tablet is not at the pharmacy. Pt is using [facility] on [address].

## 2017-06-29 ENCOUNTER — Other Ambulatory Visit: Payer: Self-pay | Admitting: Internal Medicine

## 2017-07-01 ENCOUNTER — Encounter: Payer: Self-pay | Admitting: Physical Therapy

## 2017-07-01 ENCOUNTER — Ambulatory Visit: Payer: Medicare Other | Admitting: Physical Therapy

## 2017-07-01 DIAGNOSIS — M25561 Pain in right knee: Secondary | ICD-10-CM

## 2017-07-01 DIAGNOSIS — M6281 Muscle weakness (generalized): Secondary | ICD-10-CM

## 2017-07-01 DIAGNOSIS — M25562 Pain in left knee: Secondary | ICD-10-CM

## 2017-07-01 DIAGNOSIS — M25661 Stiffness of right knee, not elsewhere classified: Secondary | ICD-10-CM | POA: Diagnosis not present

## 2017-07-01 DIAGNOSIS — M25662 Stiffness of left knee, not elsewhere classified: Secondary | ICD-10-CM | POA: Diagnosis not present

## 2017-07-01 DIAGNOSIS — G8929 Other chronic pain: Secondary | ICD-10-CM | POA: Diagnosis not present

## 2017-07-01 DIAGNOSIS — R2689 Other abnormalities of gait and mobility: Secondary | ICD-10-CM

## 2017-07-01 NOTE — Patient Instructions (Signed)

## 2017-07-01 NOTE — Therapy (Signed)
[facility] [address] [place], [place], [address] Phone: [phone]   Fax:  [phone]  Physical Therapy Treatment  Patient Details  Name: Vanessa Palmer MRN: [id number] Date of Birth: 1950/03/12 Referring Provider: Dr. Doneen Poisson    Encounter Date: 07/01/2017  PT End of Session - 07/01/17 1114    Visit Number  8    Number of Visits  15    Date for PT Re-Evaluation  07/26/17    PT Start Time  1102    PT Stop Time  1150    PT Time Calculation (min)  48 min    Activity Tolerance  Patient tolerated treatment well    Behavior During Therapy  [facility] for tasks assessed/performed       Past Medical History:  Diagnosis Date  . ALCOHOL ABUSE 12/13/2005   Annotation: Sober since 11/06 Qualifier: Diagnosis of  By: Wallace Cullens MD, Natalia Leatherwood    . Anemia   . Arthritis    "all over" (02/21/2017)  . CAD (coronary artery disease)    s/p non-Q wave MI in 06/2001 and 2004, 2005  . CHF (congestive heart failure) (HCC)   . Chronic kidney disease (CKD), stage III (moderate) (HCC) 09/19/2010  . Chronic mid back pain   . Depression   . DJD (degenerative joint disease)   . DVT (deep venous thrombosis) (HCC)    BLE  . DVT of lower extremity, bilateral (HCC) 04/04/2012   On Xarelto    . GERD (gastroesophageal reflux disease)   . Gout   . Hyperlipidemia   . Hypertension   . INTRINSIC ASTHMA, WITH EXACERBATION 09/27/2009   Qualifier: Diagnosis of  By: Denton Meek MD, Tillie Rung    . Migraine    "none anymore" (02/21/2017)  . Myocardial infarction [facility]) ?2005  . Obesity   . OSA (obstructive sleep apnea)    previously used CPAP, has lost it (02/21/2017)  . Seizures (HCC)    As a teenager     Past Surgical History:  Procedure Laterality Date  . ABDOMINAL HYSTERECTOMY     partial; due to endometriosis  . CARDIAC CATHETERIZATION  06/2001, 02/2002, 10/2004    (10-15% proximal stenosis of circumflex, diffuse disease of  OM1 and RCA)  . CARDIAC  CATHETERIZATION  02/21/2017  . COLONOSCOPY    . JOINT REPLACEMENT    . KNEE ARTHROSCOPY Left   . LEFT HEART CATH AND CORONARY ANGIOGRAPHY N/A 02/21/2017   Procedure: LEFT HEART CATH AND CORONARY ANGIOGRAPHY;  Surgeon: Rinaldo Cloud, MD;  [facility];  Service: Cardiovascular;  Laterality: N/A;  . TOTAL HIP ARTHROPLASTY Right 11/23/2014   Procedure: RIGHT TOTAL HIP ARTHROPLASTY ANTERIOR APPROACH;  Surgeon: Kathryne Hitch, MD;  Location: [facility] OR;  Service: Orthopedics;  Laterality: Right;  . TUBAL LIGATION      There were no vitals filed for this visit.  Subjective Assessment - 07/01/17 1102    Subjective  My legs are acting up due to the weather.     Currently in Pain?  Yes    Pain Score  8     Pain Location  Knee    Pain Orientation  Right    Pain Descriptors / Indicators  Aching    Pain Type  Chronic pain    Pain Onset  More than a month ago    Pain Frequency  Constant    Pain Score  4    Pain Location  Knee    Pain Orientation  Left  Pain Descriptors / Indicators  Aching    Pain Type  Chronic pain    Pain Onset  More than a month ago    Pain Frequency  Intermittent             [facility] Adult PT Treatment/Exercise - 07/01/17 0001      Knee/Hip Exercises: Seated   Long Arc Quad  Strengthening;Both;2 sets;10 reps    Ball Squeeze  x 20     Clamshell with TheraBand  Blue x 20     Marching  Strengthening;Both;1 set;10 reps    Marching Limitations  blue band       Knee/Hip Exercises: Supine   Bridges  Strengthening;Both;1 set;10 reps    Straight Leg Raises  Both;1 set;10 reps    Straight Leg Raise with External Rotation  Strengthening;Both;1 set;10 reps      Programme researcher, broadcasting/film/video Location  right knee hamstrings    Electrical Stimulation Action  IFC    Electrical Stimulation Parameters  10    Electrical Stimulation Goals  Pain      Iontophoresis   Type of Iontophoresis  Dexamethasone    Location  knee (bilateral)  anterior     Dose  1 cc     Time  6 hours              PT Education - 07/01/17 1141    Education provided  Yes    Education Details  ionto patch     Person(s) Educated  Patient    Methods  Explanation;Handout    Comprehension  Verbalized understanding;Returned demonstration       PT Short Term Goals - 06/26/17 1022      PT SHORT TERM GOAL #1   Title  Pt will be I with initial HEP for knee ROM and strength     Baseline  cues needed    Status  On-going      PT SHORT TERM GOAL #2   Title  Pt will complete balance screen and set goal (TUG vs Berg)     Status  Achieved      PT SHORT TERM GOAL #3   Title  Pt will report less pain in Rt. LE (25% less) with ADLs.     Baseline  pain varies not yet consistantly 25%    Status  On-going        PT Long Term Goals - 06/26/17 1022      PT LONG TERM GOAL #1   Title  Pt will be I with HEP for LE/knee AROM, strength.      Status  On-going      PT LONG TERM GOAL #2   Title  Pt will be able to report 20-25%  less pain with mobility, transfers in general.     Baseline  pain mod to severe most of the time    Status  On-going      PT LONG TERM GOAL #3   Title  Pt will be able to walk 300 feet with RW or cane and no more than min pain in knees.      Status  On-going      PT LONG TERM GOAL #4   Title  Pt will be able to improve balance based on functional screen, Berg to 46/56.     Status  On-going      PT LONG TERM GOAL #5   Title  TUG score will improve to 18 sec  Time  3    Period  Weeks    Status  New    Target Date  07/24/17            Plan - 07/01/17 1115    Clinical Impression Statement  Patient very limited in her ability to walk, stand wth her R knee.  She is fearful of another injection despite it helping.  She is also fearful of surgical procedure for her knee . Does not tolerate manual stretching on Rt LE.  Trial of ionto for each knee today.      PT Treatment/Interventions  ADLs/Self Care Home  Management;Cryotherapy;Ultrasound;Functional mobility training;Neuromuscular re-education;Taping;Patient/family education;Manual techniques;Therapeutic exercise;Passive range of motion;Therapeutic activities;Electrical Stimulation;Iontophoresis /ml Dexamethasone;Moist Heat;Gait training;Balance training    PT Next Visit Plan  Continue IFC/tape  ROM, , check strength. Hip strength functional strengthening,      PT Home Exercise Plan  LAQ, quad set, knee ROM, knee to chest (reissued today), hamstring stretch , standing hip abduction     Consulted and Agree with Plan of Care  Patient       Patient will benefit from skilled therapeutic intervention in order to improve the following deficits and impairments:  Abnormal gait, Decreased balance, Decreased endurance, Decreased mobility, Hypomobility, Difficulty walking, Obesity, Increased edema, Decreased activity tolerance, Decreased strength, Increased fascial restricitons, Impaired flexibility, Postural dysfunction, Pain  Visit Diagnosis: Other abnormalities of gait and mobility  Muscle weakness (generalized)  Chronic pain of left knee  Chronic pain of right knee  Stiffness of left knee, not elsewhere classified  Stiffness of right knee, not elsewhere classified     Problem List Patient Active Problem List   Diagnosis Date Noted  . Chronic pain of both knees 04/29/2017  . Unilateral primary osteoarthritis, left knee 04/29/2017  . Unilateral primary osteoarthritis, right knee 04/29/2017  . New-onset angina (HCC) 02/21/2017  . LVH (left ventricular hypertrophy) 08/15/2016  . Status post total replacement of right hip 11/23/2014  . Osteoarthritis of right hip 10/13/2014  . Nocturnal leg cramps 01/06/2014  . Bronchitis 04/25/2012  . DVT of lower extremity, bilateral (HCC) 04/04/2012  . Routine health maintenance 01/14/2012  . GERD (gastroesophageal reflux disease) 01/09/2012  . Urinary incontinence 12/31/2011  . Chronic kidney  disease (CKD), stage III (moderate) (HCC) 09/19/2010  . Chronic pelvic pain in female 02/03/2010  . Asthma, chronic 09/27/2009  . Elevated alkaline phosphatase level 08/01/2009  . Pain in joint, multiple sites 12/21/2008  . MAJOR DPRSV DISORDER RECURRENT EPISODE MODERATE 09/08/2008  . Gout 07/05/2008  . Hyperlipidemia 05/07/2006  . Coronary atherosclerosis 02/05/2006  . Morbid obesity (HCC) 12/13/2005  . History of alcohol abuse 12/13/2005  . SLEEP APNEA, OBSTRUCTIVE 12/13/2005  . Essential hypertension 12/13/2005    Vanessa Palmer 07/01/2017, 1:03 PM  [facility] [address] [place], [place], [address] Phone: [phone]   Fax:  [phone]  Name: Vanessa Palmer MRN: [id number] Date of Birth: 1950-04-29   Vanessa Palmer, PT 07/01/17 1:03 PM Phone: [phone] Fax: [phone]

## 2017-07-03 ENCOUNTER — Ambulatory Visit: Payer: Medicare Other | Admitting: Physical Therapy

## 2017-07-03 ENCOUNTER — Telehealth: Payer: Self-pay | Admitting: Physical Therapy

## 2017-07-03 NOTE — Telephone Encounter (Signed)
Left message on voicemail about missed visit.  Informed her of next appointment date and time.  Our number given if she needs to cancel or if she no longer needs PT.  I asked her to refer to the policy for missed visits. Liz Beach PTA

## 2017-07-09 ENCOUNTER — Ambulatory Visit: Payer: Medicare Other | Admitting: Physical Therapy

## 2017-07-09 DIAGNOSIS — M6281 Muscle weakness (generalized): Secondary | ICD-10-CM | POA: Diagnosis not present

## 2017-07-09 DIAGNOSIS — R2689 Other abnormalities of gait and mobility: Secondary | ICD-10-CM | POA: Diagnosis not present

## 2017-07-09 DIAGNOSIS — G8929 Other chronic pain: Secondary | ICD-10-CM | POA: Diagnosis not present

## 2017-07-09 DIAGNOSIS — M25661 Stiffness of right knee, not elsewhere classified: Secondary | ICD-10-CM | POA: Diagnosis not present

## 2017-07-09 DIAGNOSIS — M25562 Pain in left knee: Secondary | ICD-10-CM | POA: Diagnosis not present

## 2017-07-09 DIAGNOSIS — M25662 Stiffness of left knee, not elsewhere classified: Secondary | ICD-10-CM | POA: Diagnosis not present

## 2017-07-09 DIAGNOSIS — M25561 Pain in right knee: Secondary | ICD-10-CM

## 2017-07-09 NOTE — Therapy (Signed)
[place], [place], [address] Phone: [phone]   Fax:  [phone]  Physical Therapy Treatment/Discharge  Patient Details  Name: Vanessa Palmer MRN: [id number] Date of Birth: 02/10/1951 Referring Provider: Dr. Jean Rosenthal    Encounter Date: 07/09/2017  PT End of Session - 07/09/17 0924    Visit Number  9    Number of Visits  15    Date for PT Re-Evaluation  07/26/17    PT Start Time  0922    PT Stop Time  1013    PT Time Calculation (min)  51 min    Activity Tolerance  Patient limited by pain    Behavior During Therapy  [facility] for tasks assessed/performed       Past Medical History:  Diagnosis Date  . ALCOHOL ABUSE 12/13/2005   Annotation: Sober since 11/06 Qualifier: Diagnosis of  By: Pearline Cables MD, Belenda Cruise    . Anemia   . Arthritis    "all over" (02/21/2017)  . CAD (coronary artery disease)    s/p non-Q wave MI in 06/2001 and 2004, 2005  . CHF (congestive heart failure) ([place])   . Chronic kidney disease (CKD), stage III (moderate) ([place]) 09/19/2010  . Chronic mid back pain   . Depression   . DJD (degenerative joint disease)   . DVT (deep venous thrombosis) (HCC)    BLE  . DVT of lower extremity, bilateral ([place]) 04/04/2012   On Xarelto    . GERD (gastroesophageal reflux disease)   . Gout   . Hyperlipidemia   . Hypertension   . INTRINSIC ASTHMA, WITH EXACERBATION 09/27/2009   Qualifier: Diagnosis of  By: Stanford Scotland MD, Jiles Garter    . Migraine    "none anymore" (02/21/2017)  . Myocardial infarction [facility]) ?2005  . Obesity   . OSA (obstructive sleep apnea)    previously used CPAP, has lost it (02/21/2017)  . Seizures ([place])    As a teenager     Past Surgical History:  Procedure Laterality Date  . ABDOMINAL HYSTERECTOMY     partial; due to endometriosis  . CARDIAC CATHETERIZATION  06/2001, 02/2002, 10/2004    (10-15% proximal stenosis of circumflex, diffuse disease of  OM1 and RCA)  . CARDIAC  CATHETERIZATION  02/21/2017  . COLONOSCOPY    . JOINT REPLACEMENT    . KNEE ARTHROSCOPY Left   . LEFT HEART CATH AND CORONARY ANGIOGRAPHY N/A 02/21/2017   Procedure: LEFT HEART CATH AND CORONARY ANGIOGRAPHY;  Surgeon: Charolette Forward, MD;  Location: [place] CV LAB;  Service: Cardiovascular;  Laterality: N/A;  . TOTAL HIP ARTHROPLASTY Right 11/23/2014   Procedure: RIGHT TOTAL HIP ARTHROPLASTY ANTERIOR APPROACH;  Surgeon: Mcarthur Rossetti, MD;  Location: [place];  Service: Orthopedics;  Laterality: Right;  . TUBAL LIGATION      There were no vitals filed for this visit.  Subjective Assessment - 07/09/17 0926    Subjective  Missed last visit due to pain, taking meds and oversleeping.  R knee.  Does not think PT is helping it.  Feels like a stabbing pain when she moves it.  Plans to call the doctor.  Does not want to walk or ride the bike today.     Currently in Pain?  Yes    Pain Score  6     Pain Location  Knee    Pain Orientation  Right    Pain Score  3    Pain Location  Knee  Pain Orientation  Left         [facility] PT Assessment - 07/09/17 0001      AROM   Right Knee Extension  25    Right Knee Flexion  92 with AAROM            [facility] Adult PT Treatment/Exercise - 07/09/17 0001      Knee/Hip Exercises: Stretches   Active Hamstring Stretch  3 reps;30 seconds    Active Hamstring Stretch Limitations  too painful in seated on R side     Knee: Self-Stretch to increase Flexion  Both;5 reps    Knee: Self-Stretch Limitations  used sheet x 30       Knee/Hip Exercises: Seated   Long Arc Quad  Strengthening;Both;2 sets;10 reps    Clamshell with TheraBand  Green    Knee/Hip Flexion  green band x 10       Knee/Hip Exercises: Supine   Quad Sets  10 reps    Short Arc Quad Sets  10 reps    Heel Slides  10 reps painful    Bridges  Strengthening;Both;1 set;10 reps      Moist Heat Therapy   Number Minutes Moist Heat  15 Minutes    Moist Heat Location  Knee      Electrical  Stimulation   Electrical Stimulation Location  right knee hamstrings    Electrical Stimulation Action  IFC    Electrical Stimulation Parameters  11    Electrical Stimulation Goals  Pain             PT Education - 07/09/17 0957    Education provided  Yes    Education Details  Rt post knee stretch as tolerated, DC     Person(s) Educated  Patient    Methods  Explanation;Demonstration;Verbal cues    Comprehension  Verbalized understanding       PT Short Term Goals - 07/09/17 0958      PT SHORT TERM GOAL #1   Title  Pt will be I with initial HEP for knee ROM and strength     Status  Achieved      PT SHORT TERM GOAL #2   Title  Pt will complete balance screen and set goal (TUG vs Berg)     Status  Achieved      PT SHORT TERM GOAL #3   Title  Pt will report less pain in Rt. LE (25% less) with ADLs.     Status  Not Met        PT Long Term Goals - 07/09/17 8937      PT LONG TERM GOAL #1   Title  Pt will be I with HEP for LE/knee AROM, strength.      Status  Achieved      PT LONG TERM GOAL #2   Title  Pt will be able to report 20-25%  less pain with mobility, transfers in general.     Status  Not Met      PT LONG TERM GOAL #3   Title  Pt will be able to walk 300 feet with RW or cane and no more than min pain in knees.      Baseline  walks with cane , pain min to mod .  Severe when she does not use, 15 min     Status  Not Met      PT LONG TERM GOAL #4   Title  Pt will be able to improve  balance based on functional screen, Berg to 46/56.     Status  Unable to assess      PT LONG TERM GOAL #5   Title  TUG score will improve to 18 sec     Status  Unable to assess            Plan - 07/09/17 0959    Clinical Impression Statement  Pain was too high for standing ther ex and balance exercises.  AROM in Rt knee limited to -25 deg in extension, worse than initial eval.  She does not tolerate manual therapy.  Suggested she return to MD and take the next steps for  further workup of Rt knee.  She does not want surgery or injections but she did not improve with PT.  DC from therapy and patient agreeable.     PT Next Visit [place], quad set, knee ROM, knee to chest (reissued today), hamstring stretch , standing hip abduction     Recommended Other Services  possibly a brace?     Consulted and Agree with Plan of Care  Patient       Patient will benefit from skilled therapeutic intervention in order to improve the following deficits and impairments:  Abnormal gait, Decreased balance, Decreased endurance, Decreased mobility, Hypomobility, Difficulty walking, Obesity, Increased edema, Decreased activity tolerance, Decreased strength, Increased fascial restricitons, Impaired flexibility, Postural dysfunction, Pain  Visit Diagnosis: Other abnormalities of gait and mobility  Muscle weakness (generalized)  Chronic pain of left knee  Chronic pain of right knee  Stiffness of left knee, not elsewhere classified  Stiffness of right knee, not elsewhere classified     Problem List Patient Active Problem List   Diagnosis Date Noted  . Chronic pain of both knees 04/29/2017  . Unilateral primary osteoarthritis, left knee 04/29/2017  . Unilateral primary osteoarthritis, right knee 04/29/2017  . New-onset angina ([place]) 02/21/2017  . LVH (left ventricular hypertrophy) 08/15/2016  . Status post total replacement of right hip 11/23/2014  . Osteoarthritis of right hip 10/13/2014  . Nocturnal leg cramps 01/06/2014  . Bronchitis 04/25/2012  . DVT of lower extremity, bilateral ([place]) 04/04/2012  . Routine health maintenance 01/14/2012  . GERD (gastroesophageal reflux disease) 01/09/2012  . Urinary incontinence 12/31/2011  . Chronic kidney disease (CKD), stage III (moderate) ([place]) 09/19/2010  . Chronic pelvic pain in female 02/03/2010  . Asthma, chronic 09/27/2009  . Elevated alkaline phosphatase level 08/01/2009  . Pain in joint,  multiple sites 12/21/2008  . MAJOR DPRSV DISORDER RECURRENT EPISODE MODERATE 09/08/2008  . Gout 07/05/2008  . Hyperlipidemia 05/07/2006  . Coronary atherosclerosis 02/05/2006  . Morbid obesity ([place]) 12/13/2005  . History of alcohol abuse 12/13/2005  . SLEEP APNEA, OBSTRUCTIVE 12/13/2005  . Essential hypertension 12/13/2005    Esme Freund 07/09/2017, 10:02 AM  [facility] [address] [place], [place], [address] Phone: [phone]   Fax:  [phone]  Name: JERAH ESTY MRN: [id number] Date of Birth: 03-Jun-1950   PHYSICAL THERAPY DISCHARGE SUMMARY  Visits from Start of Care: 9  Current functional level related to goals / functional outcomes: See above.  Pain with ambulation, relies on cane or power wheelchair.     Remaining deficits: ROM, strength, endurance    Education / Equipment: HEP, RICE, gait , safety, options for pain relief/self care   Plan: Patient agrees to discharge.  Patient goals were partially met. Patient is being discharged due  to lack of progress.  ?????    Raeford Razor, PT 07/09/17 10:04 AM Phone: [phone] Fax: [phone]

## 2017-07-11 ENCOUNTER — Encounter: Payer: Self-pay | Admitting: Physical Therapy

## 2017-07-16 ENCOUNTER — Ambulatory Visit: Payer: Medicare Other | Admitting: Physical Therapy

## 2017-07-19 ENCOUNTER — Ambulatory Visit: Payer: Medicare Other | Admitting: Physical Therapy

## 2017-07-19 DIAGNOSIS — Z5181 Encounter for therapeutic drug level monitoring: Secondary | ICD-10-CM | POA: Diagnosis not present

## 2017-07-29 ENCOUNTER — Encounter (INDEPENDENT_AMBULATORY_CARE_PROVIDER_SITE_OTHER): Payer: Self-pay | Admitting: Physician Assistant

## 2017-07-29 ENCOUNTER — Ambulatory Visit (INDEPENDENT_AMBULATORY_CARE_PROVIDER_SITE_OTHER): Payer: Medicare Other | Admitting: Physician Assistant

## 2017-07-29 ENCOUNTER — Other Ambulatory Visit: Payer: Self-pay

## 2017-07-29 DIAGNOSIS — M170 Bilateral primary osteoarthritis of knee: Secondary | ICD-10-CM | POA: Diagnosis not present

## 2017-07-29 DIAGNOSIS — M2550 Pain in unspecified joint: Secondary | ICD-10-CM

## 2017-07-29 NOTE — Progress Notes (Signed)
Office Visit Note   Patient: Vanessa Palmer D Hemric           Date of Birth: March 28, 1950           MRN: [id number] Visit Date: 07/29/2017              Requested by: [name], [name], MD [address] [place], [place] [address] PCP: [name], [name], MD   Assessment & Plan: Visit Diagnoses:  1. Primary osteoarthritis of both knees     Plan: Spoke with [name] about the fact that her BMI would need to be under 40 degrees and consider knee replacement.  I again discussed with her cortisone injection in the knee which she states is too painful to have.  Told her that that in fact injection is too painful I have some concerns about her having surgery due to the fact the knee replacement is painful surgery the patient has to work through the pain in order to get the knee moving and strong.  She is unable take NSAIDs.  Therefore I would recommend she continue to work with physical therapy work on strengthening weight loss.  She can talk to her primary care doctor about weight loss.  She will follow-up with us on as-needed basis.  Follow-Up Instructions: Return if symptoms worsen or fail to improve.   Orders:  No orders of the defined types were placed in this encounter.  No orders of the defined types were placed in this encounter.     Procedures: No procedures performed   Clinical Data: No additional findings.   Subjective: Chief Complaint  Patient presents with  . Right Knee - Pain, Follow-up    HPI [name] returns today complaining of right knee pain despite going to physical therapy.  She is unable take NSAIDs.  She is on chronic Percocet from PCP.  She does not wish to have any type of injection because she states these are too painful.  States that the right knee pain wakes her at night with sharp pains. Review of Systems  Please see HPI otherwise negative  Objective: Vital Signs: Height 5 foot 2 inches weight 244 pounds BMI 44.6  Physical Exam  Constitutional: She is  oriented to person, place, and time. She appears well-developed and well-nourished. No distress.  Pulmonary/Chest: Effort normal.  Neurological: She is alert and oriented to person, place, and time.  Skin: She is not diaphoretic.  Psychiatric: She has a normal mood and affect.    Ortho Exam Right knee full extension and flexion.  Tenderness along medial joint line no instability with valgus varus stressing.  Calf supple nontender Specialty Comments:  No specialty comments available.  Imaging: No results found.   PMFS History: Patient Active Problem List   Diagnosis Date Noted  . Chronic pain of both knees 04/29/2017  . Unilateral primary osteoarthritis, left knee 04/29/2017  . Unilateral primary osteoarthritis, right knee 04/29/2017  . New-onset angina (HCC) 02/21/2017  . LVH (left ventricular hypertrophy) 08/15/2016  . Status post total replacement of right hip 11/23/2014  . Osteoarthritis of right hip 10/13/2014  . Nocturnal leg cramps 01/06/2014  . Bronchitis 04/25/2012  . DVT of lower extremity, bilateral (HCC) 04/04/2012  . Routine health maintenance 01/14/2012  . GERD (gastroesophageal reflux disease) 01/09/2012  . Urinary incontinence 12/31/2011  . Chronic kidney disease (CKD), stage III (moderate) (HCC) 09/19/2010  . Chronic pelvic pain in female 02/03/2010  . Asthma, chronic 09/27/2009  . Elevated alkaline phosphatase level  08/01/2009  . Pain in joint, multiple sites 12/21/2008  . MAJOR DPRSV DISORDER RECURRENT EPISODE MODERATE 09/08/2008  . Gout 07/05/2008  . Hyperlipidemia 05/07/2006  . Coronary atherosclerosis 02/05/2006  . Morbid obesity (HCC) 12/13/2005  . History of alcohol abuse 12/13/2005  . SLEEP APNEA, OBSTRUCTIVE 12/13/2005  . Essential hypertension 12/13/2005   Past Medical History:  Diagnosis Date  . ALCOHOL ABUSE 12/13/2005   Annotation: Sober since 11/06 Qualifier: Diagnosis of  By: Wallace Cullens MD, Natalia Leatherwood    . Anemia   . Arthritis    "all over"  (02/21/2017)  . CAD (coronary artery disease)    s/p non-Q wave MI in 06/2001 and 2004, 2005  . CHF (congestive heart failure) (HCC)   . Chronic kidney disease (CKD), stage III (moderate) (HCC) 09/19/2010  . Chronic mid back pain   . Depression   . DJD (degenerative joint disease)   . DVT (deep venous thrombosis) (HCC)    BLE  . DVT of lower extremity, bilateral (HCC) 04/04/2012   On Xarelto    . GERD (gastroesophageal reflux disease)   . Gout   . Hyperlipidemia   . Hypertension   . INTRINSIC ASTHMA, WITH EXACERBATION 09/27/2009   Qualifier: Diagnosis of  By: Denton Meek MD, Tillie Rung    . Migraine    "none anymore" (02/21/2017)  . Myocardial infarction [facility]) ?2005  . Obesity   . OSA (obstructive sleep apnea)    previously used CPAP, has lost it (02/21/2017)  . Seizures (HCC)    As a teenager     Family History  Problem Relation Age of Onset  . Mental illness Mother   . Heart disease Father   . Diabetes Sister   . Diabetes Sister   . Diabetes Sister     Past Surgical History:  Procedure Laterality Date  . ABDOMINAL HYSTERECTOMY     partial; due to endometriosis  . CARDIAC CATHETERIZATION  06/2001, 02/2002, 10/2004    (10-15% proximal stenosis of circumflex, diffuse disease of  OM1 and RCA)  . CARDIAC CATHETERIZATION  02/21/2017  . COLONOSCOPY    . JOINT REPLACEMENT    . KNEE ARTHROSCOPY Left   . LEFT HEART CATH AND CORONARY ANGIOGRAPHY N/A 02/21/2017   Procedure: LEFT HEART CATH AND CORONARY ANGIOGRAPHY;  Surgeon: Rinaldo Cloud, MD;  [facility];  Service: Cardiovascular;  Laterality: N/A;  . TOTAL HIP ARTHROPLASTY Right 11/23/2014   Procedure: RIGHT TOTAL HIP ARTHROPLASTY ANTERIOR APPROACH;  Surgeon: Kathryne Hitch, MD;  [facility];  Service: Orthopedics;  Laterality: Right;  . TUBAL LIGATION     Social History   Occupational History  . 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 Smoking status: Never Smoker  . Smokeless tobacco: Never Used  Substance and Sexual Activity  . Alcohol use: Yes    Alcohol/week: 7.2 oz    Types: 6 Cans of beer, 6 Standard drinks or equivalent per week    Comment: 02/21/2017 "don't drink qd; usually only on weekends and holidays"  . Drug use: No  . Sexual activity: Never

## 2017-07-29 NOTE — Telephone Encounter (Signed)
oxyCODONE-acetaminophen (PERCOCET) 10-325 MG tablet, refill request @ [facility] on [email or web address].  

## 2017-07-30 MED ORDER — OXYCODONE-ACETAMINOPHEN 10-325 MG PO TABS: 1.0000 | ORAL_TABLET | Freq: Three times a day (TID) | ORAL | 0 refills | Status: DC | PRN

## 2017-07-30 NOTE — Telephone Encounter (Signed)
Patient is calling back regarding refills °

## 2017-07-30 NOTE — Telephone Encounter (Signed)
Left message on patient's self-identified VM that Rx has been sent to pharmacy. Kinnie FeilL. Ducatte, RN, BSN

## 2017-07-31 ENCOUNTER — Other Ambulatory Visit: Payer: Self-pay | Admitting: Internal Medicine

## 2017-07-31 DIAGNOSIS — M2550 Pain in unspecified joint: Secondary | ICD-10-CM

## 2017-07-31 MED ORDER — OXYCODONE-ACETAMINOPHEN 10-325 MG PO TABS: 1.0000 | ORAL_TABLET | Freq: Three times a day (TID) | ORAL | 0 refills | Status: DC | PRN

## 2017-07-31 NOTE — Progress Notes (Signed)
Rx sent electronically.  

## 2017-07-31 NOTE — Telephone Encounter (Signed)
Patient is requesting a refill on pain medicine, pls call patient

## 2017-08-03 DIAGNOSIS — H5213 Myopia, bilateral: Secondary | ICD-10-CM | POA: Diagnosis not present

## 2017-08-07 ENCOUNTER — Ambulatory Visit (INDEPENDENT_AMBULATORY_CARE_PROVIDER_SITE_OTHER): Payer: Medicare Other | Admitting: Internal Medicine

## 2017-08-07 ENCOUNTER — Encounter: Payer: Self-pay | Admitting: Internal Medicine

## 2017-08-07 ENCOUNTER — Other Ambulatory Visit: Payer: Self-pay

## 2017-08-07 VITALS — BP 149/88 | HR 58 | Temp 97.8°F | Ht 62.0 in | Wt 250.1 lb

## 2017-08-07 DIAGNOSIS — I2510 Atherosclerotic heart disease of native coronary artery without angina pectoris: Secondary | ICD-10-CM

## 2017-08-07 DIAGNOSIS — I82403 Acute embolism and thrombosis of unspecified deep veins of lower extremity, bilateral: Secondary | ICD-10-CM

## 2017-08-07 DIAGNOSIS — M1712 Unilateral primary osteoarthritis, left knee: Secondary | ICD-10-CM | POA: Diagnosis not present

## 2017-08-07 DIAGNOSIS — Z7951 Long term (current) use of inhaled steroids: Secondary | ICD-10-CM | POA: Diagnosis not present

## 2017-08-07 DIAGNOSIS — Z6841 Body Mass Index (BMI) 40.0 and over, adult: Secondary | ICD-10-CM

## 2017-08-07 DIAGNOSIS — Z79899 Other long term (current) drug therapy: Secondary | ICD-10-CM | POA: Diagnosis not present

## 2017-08-07 DIAGNOSIS — E785 Hyperlipidemia, unspecified: Secondary | ICD-10-CM

## 2017-08-07 DIAGNOSIS — I10 Essential (primary) hypertension: Secondary | ICD-10-CM

## 2017-08-07 DIAGNOSIS — J4520 Mild intermittent asthma, uncomplicated: Secondary | ICD-10-CM

## 2017-08-07 DIAGNOSIS — N183 Chronic kidney disease, stage 3 unspecified: Secondary | ICD-10-CM

## 2017-08-07 DIAGNOSIS — J45909 Unspecified asthma, uncomplicated: Secondary | ICD-10-CM | POA: Diagnosis not present

## 2017-08-07 DIAGNOSIS — Z0000 Encounter for general adult medical examination without abnormal findings: Secondary | ICD-10-CM

## 2017-08-07 MED ORDER — ATORVASTATIN CALCIUM 40 MG PO TABS: 40.0000 mg | ORAL_TABLET | Freq: Every day | ORAL | 3 refills | Status: DC

## 2017-08-07 MED ORDER — AMLODIPINE BESYLATE 10 MG PO TABS: 10.0000 mg | ORAL_TABLET | Freq: Every day | ORAL | 3 refills | Status: DC

## 2017-08-07 MED ORDER — RIVAROXABAN 20 MG PO TABS: 20.0000 mg | ORAL_TABLET | Freq: Every day | ORAL | 3 refills | Status: DC

## 2017-08-07 NOTE — Assessment & Plan Note (Signed)
Doing well, she has elevated BP today, will increase her amlodipine.  No change in urinary habits per her.    Plan Check CMET today.

## 2017-08-07 NOTE — Assessment & Plan Note (Signed)
Well controlled on Flovent.  She reports no issues at this time and is breathing comfortably.   Plan Continue Flovent.

## 2017-08-07 NOTE — Assessment & Plan Note (Signed)
She continues to be treated with Xarelto with good results.  She has no change in swelling or pain in her legs.  She has no acute complaints today.   Plan Continue xarelto

## 2017-08-07 NOTE — Assessment & Plan Note (Signed)
Review[facility], done 03/08/10.  Normal colon, repeat in 5-10 years.  Will discuss timing of repeat with patient at next visit.

## 2017-08-07 NOTE — Assessment & Plan Note (Signed)
She is aware of the need to lose weight, particularly if she would like to get knee replacement surgery.  We brainstormed ways to increase exercise and she is willing to give this a try today.

## 2017-08-07 NOTE — Assessment & Plan Note (Signed)
This is an issue for her.  She has been to see her orthopedist who wants her to lose weight before proceeding with surgery.  She is worried she will not be able to lose the weight.  We discussed exercise and brainstormed about some places she could do (water aerobics, silver sneakers).  She says she will try.  The pain medication is continuing to help her complete her ADLs and she is using it as prescribed.  Her last UDS was appropriate 1.5 years ago.  Review of [place] narcotic database done today was appropriate.   Plan Continue oxycodone-apap at current dose, she has updated Rx.   Increase activity as she is able to help lose weight.   Offered referral to nutrition, she was not interested at this time.

## 2017-08-07 NOTE — Assessment & Plan Note (Addendum)
BP elevated today on initial check at 153/82.  On recheck her BP was 149/88.   Looking back, her BP has been elevated at previous visits this year.   She is on amlodipine and metoprolol and reports adherence.  She did bring in the medication bottles today.    Plan Check renal function  Increase amlodipine to 10mg  daily.  BP recheck in 4 weeks, then follow up in 4-6 months.

## 2017-08-07 NOTE — Patient Instructions (Addendum)
Vanessa Palmer --   You are doing well.   Please start exercising again as you can to help lose weight.  If you are interested, you can go back to see our nutritionist as well.   Please INCREASE your amlodipine to 10mg  per day.  You can take 2 tablets of your medication which you have and then refill with the appropriate dose at next visit.    Please come back to see me in 4 weeks, on July 10 for a BP recheck.

## 2017-08-07 NOTE — Progress Notes (Signed)
   Subjective:    Patient ID: Vanessa Palmer, female    DOB: [birth year], 67 y.o.   MRN: [id number]  6 month follow up for CKD, HTN  HPI  Vanessa Palmer is a 67yo woman with PMH of asthma, CKD, CAD, chronic pain, HTN, obesity who presents for follow up.  Since I last saw her she had a LHC which showed non-obstructive CAD and underwent rehab/PT.    Today, Vanessa Palmer reports that she is doing well. She has chronic knee pain and did try PT but feels that it didn't much help.  She has seen her surgeon who wants her to lose weight down to #218 before surgery given risks of a poor recovery.  We discussed exercise options for her given the chronic pain.  She notes that pain medication continues to help with her being able to accomplish her ADLs and should help with her exercising.  She has been to see our nutritionist and is not interested in going back today.    She had elevate BP on initial check today, on recheck it was still elevated.  She denies chest pain, blurry vision, headache.     She does not have her atorvastatin with her.  I do not see where it was stopped based on review of previous notes.  It appears that her Rx just expired and she did not call for clarification.  Will restart given her h/o HLD and CAD.    Review of Systems  Constitutional: Negative for activity change, appetite change, fatigue and fever.  Respiratory: Negative for cough and shortness of breath.   Cardiovascular: Negative for chest pain and leg swelling.  Gastrointestinal: Negative for abdominal pain and constipation.  Musculoskeletal: Positive for arthralgias and gait problem. Negative for joint swelling and neck pain.  Neurological: Negative for dizziness and weakness.       Objective:   Physical Exam  Constitutional: She is oriented to person, place, and time. She appears well-developed and well-nourished. No distress.  HENT:  Head: Normocephalic and atraumatic.  Cardiovascular: Normal rate, regular rhythm  and normal heart sounds.  Pulmonary/Chest: Effort normal and breath sounds normal. No respiratory distress.  Abdominal: Soft. Bowel sounds are normal. She exhibits no distension. There is no tenderness.  Musculoskeletal: She exhibits tenderness (to knees bilaterally). She exhibits no edema or deformity.  Neurological: She is alert and oriented to person, place, and time.  Skin: Skin is warm and dry.  Psychiatric: She has a normal mood and affect.  Nursing note and vitals reviewed.   CMET today.      Assessment & Plan:  RTC in 4 weeks.

## 2017-08-07 NOTE — Assessment & Plan Note (Signed)
>>  ASSESSMENT AND PLAN FOR UNILATERAL PRIMARY OSTEOARTHRITIS, LEFT KNEE WRITTEN ON 08/07/2017  8:59 AM BY MULLEN, [name], MD  This is an issue for her.  She has been to see her orthopedist who wants her to lose weight before proceeding with surgery.  She is worried she will not be able to lose the weight.  We discussed exercise and brainstormed about some places she could do (water aerobics, silver sneakers).  She says she will try.  The pain medication is continuing to help her complete her ADLs and she is using it as prescribed.  Her last UDS was appropriate 1.5 years ago.  Review of [place] narcotic database done today was appropriate.   Plan Continue oxycodone -apap at current dose, she has updated Rx.   Increase activity as she is able to help lose weight.   Offered referral to nutrition, she was not interested at this time.

## 2017-08-08 LAB — CMP14 + ANION GAP
ALT: 9 IU/L (ref 0–32)
ANION GAP: 12 mmol/L (ref 10.0–18.0)
AST: 16 IU/L (ref 0–40)
Albumin/Globulin Ratio: 1.2 (ref 1.2–2.2)
Albumin: 3.9 g/dL (ref 3.6–4.8)
Alkaline Phosphatase: 139 IU/L — ABNORMAL HIGH (ref 39–117)
BUN/Creatinine Ratio: 11 — ABNORMAL LOW (ref 12–28)
BUN: 14 mg/dL (ref 8–27)
Bilirubin Total: 0.5 mg/dL (ref 0.0–1.2)
CALCIUM: 8.8 mg/dL (ref 8.7–10.3)
CO2: 25 mmol/L (ref 20–29)
CREATININE: 1.28 mg/dL — AB (ref 0.57–1.00)
Chloride: 103 mmol/L (ref 96–106)
GFR calc non Af Amer: 44 mL/min/{1.73_m2} — ABNORMAL LOW (ref >59)
GFR, EST AFRICAN AMERICAN: 51 mL/min/{1.73_m2} — AB (ref >59)
GLUCOSE: 95 mg/dL (ref 65–99)
Globulin, Total: 3.3 g/dL (ref 1.5–4.5)
POTASSIUM: 4.3 mmol/L (ref 3.5–5.2)
Sodium: 140 mmol/L (ref 134–144)
TOTAL PROTEIN: 7.2 g/dL (ref 6.0–8.5)

## 2017-08-09 ENCOUNTER — Encounter: Payer: Self-pay | Admitting: Internal Medicine

## 2017-08-20 DIAGNOSIS — Z5181 Encounter for therapeutic drug level monitoring: Secondary | ICD-10-CM | POA: Diagnosis not present

## 2017-08-21 DIAGNOSIS — J45901 Unspecified asthma with (acute) exacerbation: Secondary | ICD-10-CM | POA: Diagnosis not present

## 2017-08-21 DIAGNOSIS — M1990 Unspecified osteoarthritis, unspecified site: Secondary | ICD-10-CM | POA: Diagnosis not present

## 2017-08-21 DIAGNOSIS — R609 Edema, unspecified: Secondary | ICD-10-CM | POA: Diagnosis not present

## 2017-08-28 ENCOUNTER — Other Ambulatory Visit: Payer: Self-pay

## 2017-08-28 DIAGNOSIS — M2550 Pain in unspecified joint: Secondary | ICD-10-CM

## 2017-08-28 MED ORDER — OXYCODONE-ACETAMINOPHEN 10-325 MG PO TABS: 1.0000 | ORAL_TABLET | Freq: Three times a day (TID) | ORAL | 0 refills | Status: DC | PRN

## 2017-08-28 NOTE — Telephone Encounter (Signed)
Last rx written 07/31/17. Last OV 08/07/17. UDS 02/16/16.

## 2017-08-28 NOTE — Telephone Encounter (Signed)
oxyCODONE-acetaminophen (PERCOCET) 10-325 MG tablet, refill request @ [facility] on [email or web address].  

## 2017-09-02 ENCOUNTER — Other Ambulatory Visit: Payer: Self-pay | Admitting: Internal Medicine

## 2017-09-02 DIAGNOSIS — K219 Gastro-esophageal reflux disease without esophagitis: Secondary | ICD-10-CM

## 2017-09-02 DIAGNOSIS — E785 Hyperlipidemia, unspecified: Secondary | ICD-10-CM

## 2017-09-02 DIAGNOSIS — M2550 Pain in unspecified joint: Secondary | ICD-10-CM

## 2017-09-02 DIAGNOSIS — J45909 Unspecified asthma, uncomplicated: Secondary | ICD-10-CM

## 2017-09-02 DIAGNOSIS — I82403 Acute embolism and thrombosis of unspecified deep veins of lower extremity, bilateral: Secondary | ICD-10-CM

## 2017-09-02 DIAGNOSIS — I10 Essential (primary) hypertension: Secondary | ICD-10-CM

## 2017-09-02 MED ORDER — FLUTICASONE PROPIONATE 50 MCG/ACT NA SUSP: NASAL | 3 refills | Status: DC

## 2017-09-02 MED ORDER — RIVAROXABAN 20 MG PO TABS: 20.0000 mg | ORAL_TABLET | Freq: Every day | ORAL | 3 refills | Status: DC

## 2017-09-02 MED ORDER — OXYBUTYNIN CHLORIDE 5 MG PO TABS: 5.0000 mg | ORAL_TABLET | Freq: Two times a day (BID) | ORAL | 3 refills | Status: DC

## 2017-09-02 MED ORDER — NITROGLYCERIN 0.4 MG SL SUBL: 0.4000 mg | SUBLINGUAL_TABLET | SUBLINGUAL | 1 refills | Status: DC | PRN

## 2017-09-02 MED ORDER — OXYCODONE-ACETAMINOPHEN 10-325 MG PO TABS
1.0000 | ORAL_TABLET | Freq: Three times a day (TID) | ORAL | 0 refills | Status: DC | PRN
Start: 2017-09-02 — End: 2017-10-21

## 2017-09-02 MED ORDER — FLUTICASONE PROPIONATE HFA 44 MCG/ACT IN AERO
2.0000 | INHALATION_SPRAY | Freq: Two times a day (BID) | RESPIRATORY_TRACT | 12 refills | Status: DC
Start: 2017-09-02 — End: 2018-06-25

## 2017-09-02 NOTE — Telephone Encounter (Signed)
Called patient. Using [facility] pharmacy. States all meds were in a pink bag, had people in her home and now pink bag is gone. [facility]. Will route to PCP for consideration. Vanessa Palmer. Rylin Seavey, RN, BSN

## 2017-09-02 NOTE — Telephone Encounter (Signed)
Refill Request on all of her Medication.  Pt states she lost all of her Medications and has contacted her pharmacy.    amLODipine (NORVASC) 10 MG tablet    atorvastatin (LIPITOR) 40 MG tablet    fluticasone (FLONASE) 50 MCG/ACT nasal spray    fluticasone (FLOVENT HFA) 44 MCG/ACT inhaler    furosemide (LASIX) 40 MG tablet    metoprolol succinate (TOPROL-XL) 50 MG 24 hr tablet    nitroGLYCERIN (NITROSTAT) 0.4 MG SL tablet    omeprazole (PRILOSEC) 20 MG capsule    oxybutynin (DITROPAN) 5 MG tablet    oxybutynin (DITROPAN) 5 MG tablet    oxyCODONE-acetaminophen (PERCOCET) 10-325 MG tablet    rivaroxaban (XARELTO) 20 MG TABS tablet    traZODone (DESYREL) 50 MG tablet

## 2017-09-02 NOTE — Telephone Encounter (Signed)
Spoke with pharmacist. States they have refilled all patient's meds and will deliver to patient's home tomorrow. Exceptions:  Flonase last written 11/22/2015 Flovent last written 06/02/2013 NTG  last recorded 10/12/2014 Oxybutynin  last written 11/23/2015 [facility] 08/28/2017 Xarelto written 08/07/2017 "No Print" option selected L. Leward Quanucatte, RN, BSN

## 2017-09-10 DIAGNOSIS — Z5181 Encounter for therapeutic drug level monitoring: Secondary | ICD-10-CM | POA: Diagnosis not present

## 2017-09-12 DIAGNOSIS — Z5181 Encounter for therapeutic drug level monitoring: Secondary | ICD-10-CM | POA: Diagnosis not present

## 2017-09-25 DIAGNOSIS — R609 Edema, unspecified: Secondary | ICD-10-CM | POA: Diagnosis not present

## 2017-09-25 DIAGNOSIS — M1990 Unspecified osteoarthritis, unspecified site: Secondary | ICD-10-CM | POA: Diagnosis not present

## 2017-09-25 DIAGNOSIS — J45901 Unspecified asthma with (acute) exacerbation: Secondary | ICD-10-CM | POA: Diagnosis not present

## 2017-09-27 ENCOUNTER — Other Ambulatory Visit: Payer: Self-pay | Admitting: Internal Medicine

## 2017-10-07 DIAGNOSIS — Z5181 Encounter for therapeutic drug level monitoring: Secondary | ICD-10-CM | POA: Diagnosis not present

## 2017-10-08 ENCOUNTER — Other Ambulatory Visit: Payer: Self-pay

## 2017-10-09 DIAGNOSIS — Z5181 Encounter for therapeutic drug level monitoring: Secondary | ICD-10-CM | POA: Diagnosis not present

## 2017-10-14 ENCOUNTER — Other Ambulatory Visit: Payer: Self-pay | Admitting: Internal Medicine

## 2017-10-17 ENCOUNTER — Other Ambulatory Visit: Payer: Self-pay

## 2017-10-17 NOTE — Patient Outreach (Signed)
[facility]N) Care Management  10/17/2017  Vanessa Palmer 1950-08-21 [id number]   TELEPHONE SCREENING Referral date: 10/08/17 Referral source: EMMI campaign engagement Referral reason: EMMI engagement score Insurance: [employer] health care  Telephone call to patient regarding EMMI engagement referral. HIPAA verified with patient. Explained reason for call. Patient reports she has difficulty with transportation and sometimes forgets to take her medications. RNCM discussed and offered [facility] care management services to patient. Patient verbally agreed.  Patient states she is not at home right now and request call back to complete screening and referral to [facility] care [facility] services.   PLAN: RNCM will attempt follow up call with patient within 2 business days.   [name] RN,BSN,CCM [facility] Telephonic  [phone]

## 2017-10-17 NOTE — Patient Outreach (Signed)
[facility](THN) Care Management  10/17/2017  Loni Musedell D Loughridge [birth year] [id number]    TELEPHONE SCREENING Referral date: 10/08/17 Referral source:  EMMI engagement campaign Referral reason: EMMI score Insurance: United health care  Telephone call to patient to complete screening.  HIPAA verified by patient. Explained reason for call.     TRANSPORTATION:  Patient states she is having transportation issues.   She states she is unable to schedule her cardiology follow up appointment because she doesn't have transportation.  MEDICATIONS:  Patient states sometimes she forgets her medications.  Reports taking approximately 8 medications. She states she is able to afford her medications she just forgets sometimes.  NURSING NEEDS:  Patient states she has congestive heart failure.  She states she has been unable to get to her cardiology appointments due to transportation issues.   Patient states  She had two heart attacks the first being in 2005 after losing a child.   Patient states she does not check her weights consistently.  She states she had a Museum/gallery exhibitions officerhumana nurse in the past.  Patient states she still has the scale but the tablet stopped working.  Patient states, " I just got disappointed with myself and stop weighing and caring for herself well."  Patient states, " I attend a program 4 days per week for alcoholics and people who abuse drugs."   Patient reports she has fallen at least 3 times over the past 3 months.   SOCIAL:  Patient reports living alone.  She states she has an aid 2 hours per day. Patient reports she has depression symptoms. Patients states her depression symptoms has caused her to stop doing some of the things she is suppose to do for her health.  When asked if taking medication for depression patient states she doesn't know if she is or not.   ASSESSMENT;  Completed PHQ 2 - 2   And PHQ9 - 17.   PLAN: RNCM will refer patient to community case Production designer, theatre/television/filmmanager and Arts development officersocial  worker.      George InaDavina Becci Batty RN,BSN,CCM [facility] Telephonic  [phone]

## 2017-10-21 ENCOUNTER — Other Ambulatory Visit: Payer: Self-pay | Admitting: Internal Medicine

## 2017-10-21 ENCOUNTER — Other Ambulatory Visit: Payer: Self-pay | Admitting: *Deleted

## 2017-10-21 DIAGNOSIS — M2550 Pain in unspecified joint: Secondary | ICD-10-CM

## 2017-10-21 NOTE — Patient Outreach (Signed)
[facility]) Care Management  10/21/2017  Loni Musedell D Longmore Oct 15, 1950 [id number]   Patient [facility] to [facility] social work to address transportation needs and depression symptoms.  This IT sales professionalsocial worker covering for assigned social worker [place]oke Joyce, [place]. Per patient, she has medicaid and [facility] access [facility] but does not like to use it because of the long hold times when scheduling. "I am not a patient person". Patient understands the importnce of following up with her primary as well as her heart doctor and states that she will call today "maybe" to schedule an appointment as well as to arrange her own transportation. SCAT also discussed with patient, as patient discussed having mobility issues and is in a wheelchair, however per patient she does not want to pay the fare. Per patient, she would rather stay with Medicaid transportation. Patient described increased family conflict that is the reason for her depression. Patient states that she is [facility] in [facility]. Per patient, she did [facility] a [facility] but is not sure how often she will be seen. Patient is not on any antidepressant medication. Patient encouraged to discuss medication options if desired with her primary care doctor during her next visit. Patient denied thoughts of self harm or harm to others. stating that she uses prayer to cope. This social worker emphasized self care and follow up with her doctor. Patient agrees with a follow up call from assigned social worker Dickie LaBrooke Joyce upon her return.  Plan: Social Worker Dickie LaBrooke Joyce  to follow up with patient upon her return.  Adriana ReamsChrystal Land, LCSW [facility] Care Management [phone]

## 2017-10-21 NOTE — Patient Outreach (Signed)
[facility]N) Care Management  10/21/2017  Loni Musedell D Musial 19-Sep-1950 [id number]   Referral received from telephonic care manager for safety assessment as member has had multiple falls over the last 3 months.  Per chart, she also has history of hypertension, GERD, hyperlipidemia, and chronic kidney disease.  Call placed to member to introduce self and schedule home visit.  No answer, HIPAA compliant voice message left.  Unsuccessful outreach letter sent, will follow up within the next 4 business days.  Kemper DurieMonica Levi Klaiber, [place]RN, MSN [facility] Care Management  [facility] Care Manager [phone]

## 2017-10-21 NOTE — Telephone Encounter (Signed)
Needs refills on oxyCODONE-acetaminophen (PERCOCET) 10-325 MG tablet @ [facility] [address]; pt contact [phone]

## 2017-10-22 MED ORDER — OXYCODONE-ACETAMINOPHEN 10-325 MG PO TABS: 1.0000 | ORAL_TABLET | Freq: Three times a day (TID) | ORAL | 0 refills | Status: DC | PRN

## 2017-10-22 NOTE — Telephone Encounter (Signed)
PRMP appropriate.  Refill X 1 month

## 2017-10-23 ENCOUNTER — Other Ambulatory Visit: Payer: Self-pay | Admitting: Licensed Clinical Social Worker

## 2017-10-23 NOTE — Patient Outreach (Signed)
[facility]N) Care Management  10/23/2017  Loni Musedell D Phillis 17-Nov-1950 [id number]  Assessment-CSW completed outreach attempt today to follow up on social work needs. CSW unable to reach patient successfully. CSW left a HIPPA compliant voice message encouraging patient to return call once available. Unsuccessful outreach letter sent by [facility] RNCM already.   Plan-CSW will await return call or complete an additional outreach if needed.  Dickie LaBrooke Iman Orourke, BSW, MSW, LCSW [facility] Care Management [email or web address] Phone: [phone] Fax: [phone]

## 2017-10-25 ENCOUNTER — Other Ambulatory Visit: Payer: Self-pay | Admitting: Licensed Clinical Social Worker

## 2017-10-25 ENCOUNTER — Other Ambulatory Visit: Payer: Self-pay

## 2017-10-25 NOTE — Patient Outreach (Signed)
[facility]) Care Management  10/25/2017  Vanessa Palmer [birth year] [id number]  Assessment-CSW completed second outreach attempt today. CSW unable to reach patient successfully. CSW left a HIPPA compliant voice message encouraging patient to return call once available. Unsuccessful outreach letter sent by [name].   Plan-CSW will await return call or complete an additional outreach if needed.  Vanessa Palmer Vanessa Palmer, BSW, MSW, LCSW [facility] Care Management [email or web address] Phone: [phone] Fax: [phone]

## 2017-10-25 NOTE — Patient Outreach (Signed)
[facility]N) Care Management  10/25/2017  Vanessa Palmer 20-Dec-1950 [id number]   Referral received from telephonic care manager for safety assessment as member has had multiple falls over the last 3 months.  Per chart, she also has history of hypertension, GERD, hyperlipidemia, and chronic kidney disease.  RNCM called for assigned RNCM, Rockwell AutomationMonica Palmer. No answer HIPPA compliant message left.Unsuccessful Outreach letter send 10/21/17.  Plan: Follow up call within 3-4 business days of today.   Covering RNCM: Vanessa SheriffJuana Shiloh Swopes, RN, MSN, Outpatient CarecenterBSN,CCM [facility] Community Care Coordinator Cell: [phone]

## 2017-10-27 ENCOUNTER — Emergency Department (HOSPITAL_COMMUNITY)
Admission: EM | Admit: 2017-10-27 | Discharge: 2017-10-28 | Disposition: A | Discharge status: Home / Self Care | Payer: Medicare Other | Attending: Emergency Medicine | Admitting: Emergency Medicine

## 2017-10-27 DIAGNOSIS — L509 Urticaria, unspecified: Secondary | ICD-10-CM | POA: Insufficient documentation

## 2017-10-27 DIAGNOSIS — I2510 Atherosclerotic heart disease of native coronary artery without angina pectoris: Secondary | ICD-10-CM | POA: Insufficient documentation

## 2017-10-27 DIAGNOSIS — I509 Heart failure, unspecified: Secondary | ICD-10-CM | POA: Diagnosis not present

## 2017-10-27 DIAGNOSIS — J45909 Unspecified asthma, uncomplicated: Secondary | ICD-10-CM | POA: Diagnosis not present

## 2017-10-27 DIAGNOSIS — Z79899 Other long term (current) drug therapy: Secondary | ICD-10-CM | POA: Diagnosis not present

## 2017-10-27 DIAGNOSIS — N183 Chronic kidney disease, stage 3 (moderate): Secondary | ICD-10-CM | POA: Insufficient documentation

## 2017-10-27 DIAGNOSIS — R21 Rash and other nonspecific skin eruption: Secondary | ICD-10-CM | POA: Diagnosis present

## 2017-10-27 DIAGNOSIS — I10 Essential (primary) hypertension: Secondary | ICD-10-CM | POA: Diagnosis not present

## 2017-10-27 DIAGNOSIS — Z7901 Long term (current) use of anticoagulants: Secondary | ICD-10-CM | POA: Diagnosis not present

## 2017-10-27 DIAGNOSIS — T7840XA Allergy, unspecified, initial encounter: Secondary | ICD-10-CM | POA: Diagnosis not present

## 2017-10-27 DIAGNOSIS — L299 Pruritus, unspecified: Secondary | ICD-10-CM | POA: Diagnosis not present

## 2017-10-27 DIAGNOSIS — I130 Hypertensive heart and chronic kidney disease with heart failure and stage 1 through stage 4 chronic kidney disease, or unspecified chronic kidney disease: Secondary | ICD-10-CM | POA: Insufficient documentation

## 2017-10-27 NOTE — ED Triage Notes (Signed)
Pt reports she was eating out and somebody else was eating shell fish which she is allergic to.  Pt states "I just started itching like crazy".  Pt was given 50mg  benadryl PO by EMS.  No swelling to throat, able to speak in complete sentences, no respiratory distress.

## 2017-10-28 ENCOUNTER — Encounter (HOSPITAL_COMMUNITY): Payer: Self-pay | Admitting: Emergency Medicine

## 2017-10-28 DIAGNOSIS — L509 Urticaria, unspecified: Secondary | ICD-10-CM | POA: Diagnosis not present

## 2017-10-28 MED ORDER — FAMOTIDINE 20 MG PO TABS
20.0000 mg | ORAL_TABLET | Freq: Once | ORAL | Status: AC
  Administered 2017-10-28: 20 mg via ORAL
  Filled 2017-10-28: qty 1

## 2017-10-28 MED ORDER — FAMOTIDINE IN NACL 20-0.9 MG/50ML-% IV SOLN
20.0000 mg | Freq: Once | INTRAVENOUS | Status: AC
  Administered 2017-10-28: 20 mg via INTRAVENOUS
  Filled 2017-10-28: qty 50

## 2017-10-28 MED ORDER — PREDNISONE 20 MG PO TABS
60.0000 mg | ORAL_TABLET | Freq: Once | ORAL | Status: AC
  Administered 2017-10-28: 60 mg via ORAL
  Filled 2017-10-28: qty 3

## 2017-10-28 MED ORDER — METHYLPREDNISOLONE SODIUM SUCC 125 MG IJ SOLR
125.0000 mg | Freq: Once | INTRAMUSCULAR | Status: AC
  Administered 2017-10-28: 125 mg via INTRAVENOUS
  Filled 2017-10-28: qty 2

## 2017-10-28 MED ORDER — DIPHENHYDRAMINE HCL 50 MG/ML IJ SOLN
25.0000 mg | Freq: Once | INTRAMUSCULAR | Status: AC
  Administered 2017-10-28: 25 mg via INTRAVENOUS
  Filled 2017-10-28: qty 1

## 2017-10-28 NOTE — ED Notes (Signed)
Pt discharged from ED; instructions provided; Pt encouraged to return to ED if symptoms worsen and to f/u with PCP; Pt verbalized understanding of all instructions 

## 2017-10-28 NOTE — Discharge Instructions (Addendum)
See your Physician for recheck.  Continue benadryl for hives

## 2017-10-28 NOTE — ED Provider Notes (Signed)
[facility] EMERGENCY DEPARTMENT Provider Note   CSN: [id number] Arrival date & time: 10/27/17  2347     History   Chief Complaint Chief Complaint  Patient presents with  . Allergic Reaction    HPI Vanessa Palmer is a 67 y.o. female.  The history is provided by the patient. No language interpreter was used.  Allergic Reaction  Presenting symptoms: itching and rash   Presenting symptoms: no difficulty breathing, no difficulty swallowing, no swelling and no wheezing   Severity:  Moderate Duration:  1 hour Prior allergic episodes:  Food/nut allergies Context: food   Relieved by:  Nothing Worsened by:  Nothing Ineffective treatments:  None tried Pt is allergic to seafood.  Pt reports the person beside her was eating shellfish and she began itching.  Pt complains of a rash  Past Medical History:  Diagnosis Date  . ALCOHOL ABUSE 12/13/2005   Annotation: Sober since 11/06 Qualifier: Diagnosis of  By: Wallace Cullens MD, Natalia Leatherwood    . Anemia   . Arthritis    "all over" (02/21/2017)  . CAD (coronary artery disease)    s/p non-Q wave MI in 06/2001 and 2004, 2005  . CHF (congestive heart failure) (HCC)   . Chronic kidney disease (CKD), stage III (moderate) (HCC) 09/19/2010  . Chronic mid back pain   . Depression   . DJD (degenerative joint disease)   . DVT (deep venous thrombosis) (HCC)    BLE  . DVT of lower extremity, bilateral (HCC) 04/04/2012   On Xarelto    . GERD (gastroesophageal reflux disease)   . Gout   . Hyperlipidemia   . Hypertension   . INTRINSIC ASTHMA, WITH EXACERBATION 09/27/2009   Qualifier: Diagnosis of  By: Denton Meek MD, Tillie Rung    . Migraine    "none anymore" (02/21/2017)  . Myocardial infarction [facility]) ?2005  . Obesity   . OSA (obstructive sleep apnea)    previously used CPAP, has lost it (02/21/2017)  . Seizures (HCC)    As a teenager     Patient Active Problem List   Diagnosis Date Noted  . Chronic pain of both knees 04/29/2017  .  Unilateral primary osteoarthritis, left knee 04/29/2017  . Unilateral primary osteoarthritis, right knee 04/29/2017  . New-onset angina (HCC) 02/21/2017  . LVH (left ventricular hypertrophy) 08/15/2016  . Status post total replacement of right hip 11/23/2014  . Osteoarthritis of right hip 10/13/2014  . Nocturnal leg cramps 01/06/2014  . DVT of lower extremity, bilateral (HCC) 04/04/2012  . Routine health maintenance 01/14/2012  . GERD (gastroesophageal reflux disease) 01/09/2012  . Urinary incontinence 12/31/2011  . Chronic kidney disease (CKD), stage III (moderate) (HCC) 09/19/2010  . Chronic pelvic pain in female 02/03/2010  . Asthma, chronic 09/27/2009  . Elevated alkaline phosphatase level 08/01/2009  . Pain in joint, multiple sites 12/21/2008  . MAJOR DPRSV DISORDER RECURRENT EPISODE MODERATE 09/08/2008  . Gout 07/05/2008  . Hyperlipidemia 05/07/2006  . Coronary atherosclerosis 02/05/2006  . Morbid obesity (HCC) 12/13/2005  . History of alcohol abuse 12/13/2005  . SLEEP APNEA, OBSTRUCTIVE 12/13/2005  . Essential hypertension 12/13/2005    Past Surgical History:  Procedure Laterality Date  . ABDOMINAL HYSTERECTOMY     partial; due to endometriosis  . CARDIAC CATHETERIZATION  06/2001, 02/2002, 10/2004    (10-15% proximal stenosis of circumflex, diffuse disease of  OM1 and RCA)  . CARDIAC CATHETERIZATION  02/21/2017  . COLONOSCOPY    . JOINT REPLACEMENT    . KNEE  ARTHROSCOPY Left   . LEFT HEART CATH AND CORONARY ANGIOGRAPHY N/A 02/21/2017   Procedure: LEFT HEART CATH AND CORONARY ANGIOGRAPHY;  Surgeon: Rinaldo Cloud, MD;  Location: [facility] INVASIVE CV LAB;  Service: Cardiovascular;  Laterality: N/A;  . TOTAL HIP ARTHROPLASTY Right 11/23/2014   Procedure: RIGHT TOTAL HIP ARTHROPLASTY ANTERIOR APPROACH;  Surgeon: Kathryne Hitch, MD;  Location: [facility] OR;  Service: Orthopedics;  Laterality: Right;  . TUBAL LIGATION       OB History   None      Home Medications    Prior  to Admission medications   Medication Sig Start Date End Date Taking? Authorizing Provider  amLODipine (NORVASC) 10 MG tablet Take 1 tablet (10 mg total) by mouth daily. 08/07/17   Inez Catalina, MD  atorvastatin (LIPITOR) 40 MG tablet Take 1 tablet (40 mg total) by mouth daily at 6 PM. 08/07/17   Inez Catalina, MD  fluticasone (FLONASE) 50 MCG/ACT nasal spray INSTILL TWO   SPRAYS IN EACH NOSTRIL ONCE DAILY AS NEEDED FOR ALLERGIES 09/02/17   Inez Catalina, MD  fluticasone (FLOVENT HFA) 44 MCG/ACT inhaler Inhale 2 puffs into the lungs 2 (two) times daily. 09/02/17   Inez Catalina, MD  furosemide (LASIX) 40 MG tablet TAKE ONE TABLET BY MOUTH DAILY 10/15/17   Earl Lagos, MD  metoprolol succinate (TOPROL-XL) 50 MG 24 hr tablet Take 50 mg by mouth daily. 01/21/17   [provider]  nitroGLYCERIN (NITROSTAT) 0.4 MG SL tablet Place 1 tablet (0.4 mg total) under the tongue every 5 (five) minutes as needed for chest pain. 09/02/17   Inez Catalina, MD  omeprazole (PRILOSEC) 20 MG capsule Take 1 capsule (20 mg total) by mouth daily. 05/07/17   Doneen Poisson, MD  oxybutynin (DITROPAN) 5 MG tablet Take by mouth. 11/03/15   [provider]  oxybutynin (DITROPAN) 5 MG tablet Take 1 tablet (5 mg total) by mouth 2 (two) times daily. 09/02/17   Inez Catalina, MD  oxyCODONE-acetaminophen (PERCOCET) 10-325 MG tablet Take 1 tablet by mouth every 8 (eight) hours as needed for pain (chronic arthritis pain, multiple sites, diag code M16.11, M25.50). 10/22/17   Inez Catalina, MD  rivaroxaban (XARELTO) 20 MG TABS tablet Take 1 tablet (20 mg total) by mouth daily with supper. 09/02/17   Inez Catalina, MD  traZODone (DESYREL) 50 MG tablet TAKE 1 TO TWO TABLETS (50-100 MG TOTAL) BY MOUTH AT BEDTIME 10/01/17   Inez Catalina, MD  furosemide (LASIX) 20 MG tablet Take one half of the tablet once daily for 5 days. Then stop 09/19/10 11/29/19  Denna Haggard, MD    Family History Family History  Problem  Relation Age of Onset  . Mental illness Mother   . Heart disease Father   . Diabetes Sister   . Diabetes Sister   . Diabetes Sister     Social History Social History   Tobacco Use  . Smoking status: Never Smoker  . Smokeless tobacco: Never Used  Substance Use Topics  . Alcohol use: Not Currently    Alcohol/week: 12.0 standard drinks    Types: 6 Cans of beer, 6 Standard drinks or equivalent per week    Comment: "Cleaned x 100 +days".  . Drug use: No     Allergies   Ace inhibitors; Regadenoson; Peanut oil; Hydrocodone-acetaminophen; Hydromorphone hcl; Hydromorphone hcl; Other; Oxycodone-acetaminophen; Percocet [oxycodone-acetaminophen]; Propoxyphene n-acetaminophen; and Vicodin [hydrocodone-acetaminophen]   Review of Systems Review of Systems  HENT: Negative  for trouble swallowing.   Respiratory: Negative for wheezing.   Skin: Positive for itching and rash.  All other systems reviewed and are negative.    Physical Exam Updated Vital Signs BP 120/62   Pulse 63   Temp 98.3 F (36.8 C) (Oral)   Resp (!) 22   SpO2 95%   Physical Exam  Constitutional: She appears well-developed and well-nourished.  HENT:  Head: Normocephalic.  Right Ear: External ear normal.  Left Ear: External ear normal.  Nose: Nose normal.  Mouth/Throat: Oropharynx is clear and moist.  Eyes: Pupils are equal, round, and reactive to light.  Neck: Normal range of motion.  Cardiovascular: Normal rate.  Pulmonary/Chest: Effort normal.  Abdominal: Soft.  Musculoskeletal: Normal range of motion.  Neurological: She is alert.  Skin: Skin is warm.  Psychiatric: She has a normal mood and affect.  Nursing note and vitals reviewed.    ED Treatments / Results  Labs (all labs ordered are listed, but only abnormal results are displayed) Labs Reviewed - No data to display  EKG None  Radiology No results found.  Procedures Procedures (including critical care time)  Medications Ordered in  ED Medications  predniSONE (DELTASONE) tablet 60 mg (60 mg Oral Given 10/28/17 0043)  famotidine (PEPCID) tablet 20 mg (20 mg Oral Given 10/28/17 0043)  famotidine (PEPCID) IVPB 20 mg premix (0 mg Intravenous Stopped 10/28/17 0128)  methylPREDNISolone sodium succinate (SOLU-MEDROL) 125 mg/2 mL injection 125 mg (125 mg Intravenous Given 10/28/17 0052)  diphenhydrAMINE (BENADRYL) injection 25 mg (25 mg Intravenous Given 10/28/17 0053)     Initial Impression / Assessment and Plan / ED Course  I have reviewed the triage vital signs and the nursing notes. An After Visit Summary was printed and given to the patient.  Pertinent labs & imaging results that were available during my care of the patient were reviewed by me and considered in my medical decision making (see chart for details).     Pt given IV solumedrol, pepcid and benadryl.  Pt observed and reexaimned.  Pt feels much better.  Rash has completely cleared.  Pt advised to follow up with her MD for recheck.   Final Clinical Impressions(s) / ED Diagnoses   Final diagnoses:  Hives    ED Discharge Orders    None    An After Visit Summary was printed and given to the patient.    Elson Areas, PA-C 10/28/17 0416    Zadie Rhine, MD 10/28/17 [phone]

## 2017-10-31 ENCOUNTER — Other Ambulatory Visit: Payer: Self-pay | Admitting: *Deleted

## 2017-10-31 NOTE — Patient Outreach (Signed)
[facility]) Care Management  10/31/2017  Vanessa Palmer Mar 10, 1950 [id number]   3rd attempt made to contact member or involvement with [facility].  Member verifies identity, this care manager introduces self and purpose of call.  [facility] care management services explained.  She report she is doing well.  Noted member visited the ED this week, she report she had an allergic reaction to seafood and is much better now.  Denies any urgent concerns, agrees to home visit next week.  Will perform initial assessment and develop individualized care plan at that time.  Kemper Durie, [place], MSN [facility] Care Management  [facility] Manager [phone]

## 2017-11-04 ENCOUNTER — Other Ambulatory Visit: Payer: Self-pay | Admitting: Licensed Clinical Social Worker

## 2017-11-04 NOTE — Patient Outreach (Signed)
[facility]) Care Management  11/04/2017  Vanessa Palmer Jul 18, 1950 [id number]  Assessment-CSW completed third outreach attempt today. CSW unable to reach patient successfully. CSW left a HIPPA compliant voice message encouraging patient to return call if still interested in social work support. [facility] RNCM was able to establish contact with patient successfully.   Plan-CSW will close case at this time due to inability to establish contact with patient.  [name], BSW, MSW, LCSW [facility] [email or web address] Phone: [phone] Fax: [phone]

## 2017-11-06 ENCOUNTER — Other Ambulatory Visit: Payer: Self-pay | Admitting: *Deleted

## 2017-11-06 ENCOUNTER — Encounter: Payer: Self-pay | Admitting: *Deleted

## 2017-11-06 NOTE — Patient Outreach (Signed)
[facility]) Care Management   11/06/2017  Vanessa Palmer [birth year] [id number]  Vanessa Palmer is an 67 y.o. female  Subjective:   Member alert and oriented x3, complains of leg/ankle pain, which is chronic.  Report compliance with medications.    Objective:   Review of Systems  Constitutional: Negative.   HENT: Negative.   Eyes: Negative.   Respiratory: Negative.   Cardiovascular: Negative.   Gastrointestinal: Negative.   Genitourinary: Negative.   Musculoskeletal: Positive for falls and joint pain.  Skin: Negative.   Neurological: Negative.   Endo/Heme/Allergies: Negative.   Psychiatric/Behavioral: Negative.     Physical Exam  Constitutional: She is oriented to person, place, and time. She appears well-developed and well-nourished.  Neck: Normal range of motion.  Cardiovascular: Normal rate, regular rhythm and normal heart sounds.  Respiratory: Effort normal and breath sounds normal.  GI: Soft. Bowel sounds are normal.  Musculoskeletal: Normal range of motion.  Neurological: She is alert and oriented to person, place, and time.  Skin: Skin is warm and dry.   BP 122/82   Pulse (!) 58   Resp 18   Ht 1.575 m ('5\' 2"'$ )   Wt 254 lb (115.2 kg)   SpO2 97%   BMI 46.46 kg/m   Encounter Medications:   Outpatient Encounter Medications as of 11/06/2017  Medication Sig  . amLODipine (NORVASC) 10 MG tablet Take 1 tablet (10 mg total) by mouth daily.  Marland Kitchen atorvastatin (LIPITOR) 40 MG tablet Take 1 tablet (40 mg total) by mouth daily at 6 PM.  . fluticasone (FLONASE) 50 MCG/ACT nasal spray INSTILL TWO   SPRAYS IN EACH NOSTRIL ONCE DAILY AS NEEDED FOR ALLERGIES  . fluticasone (FLOVENT HFA) 44 MCG/ACT inhaler Inhale 2 puffs into the lungs 2 (two) times daily.  . furosemide (LASIX) 40 MG tablet TAKE ONE TABLET BY MOUTH DAILY  . metoprolol succinate (TOPROL-XL) 50 MG 24 hr tablet Take 50 mg by mouth daily.  . nitroGLYCERIN (NITROSTAT) 0.4 MG SL tablet Place 1  tablet (0.4 mg total) under the tongue every 5 (five) minutes as needed for chest pain.  Marland Kitchen omeprazole (PRILOSEC) 20 MG capsule Take 1 capsule (20 mg total) by mouth daily.  Marland Kitchen oxybutynin (DITROPAN) 5 MG tablet Take by mouth.  . oxyCODONE-acetaminophen (PERCOCET) 10-325 MG tablet Take 1 tablet by mouth every 8 (eight) hours as needed for pain (chronic arthritis pain, multiple sites, diag code M16.11, M25.50).  . rivaroxaban (XARELTO) 20 MG TABS tablet Take 1 tablet (20 mg total) by mouth daily with supper.  . traZODone (DESYREL) 50 MG tablet TAKE 1 TO TWO TABLETS (50-100 MG TOTAL) BY MOUTH AT BEDTIME  . oxybutynin (DITROPAN) 5 MG tablet Take 1 tablet (5 mg total) by mouth 2 (two) times daily. (Patient not taking: Reported on 11/06/2017)  . [DISCONTINUED] furosemide (LASIX) 20 MG tablet Take one half of the tablet once daily for 5 days. Then stop   No facility-administered encounter medications on file as of 11/06/2017.     Functional Status:   In your present state of health, do you have any difficulty performing the following activities: 11/06/2017 08/07/2017  Hearing? Y N  Vision? N N  Difficulty concentrating or making decisions? N N  Walking or climbing stairs? Y Y  Dressing or bathing? Y Y  Doing errands, shopping? Y [place] and eating ? N -  Using the Toilet? Y -  In the past six months, have you  accidently leaked urine? Y -  Do you have problems with loss of bowel control? N -  Managing your Medications? N -  Managing your Finances? N -  Housekeeping or managing your Housekeeping? N -  Some recent data might be hidden    Fall/Depression Screening:    Fall Risk  11/06/2017 08/07/2017 05/01/2017  Falls in the past year? Yes Yes Yes  Number falls in past yr: 2 or more 2 or more 2 or more  Injury with Fall? No No No  Risk Factor Category  High Fall Risk High Fall Risk High Fall Risk  Comment - - -  Risk for fall due to : History of fall(s) Impaired  balance/gait;Impaired mobility -  Risk for fall due to: Comment - - -  Follow up Falls prevention discussed Falls prevention discussed -   PHQ 2/9 Scores 10/17/2017 08/07/2017 05/01/2017 01/23/2017 12/05/2016 09/05/2016 08/24/2016  PHQ - 2 Score 3 0 0 _0 PHQ- 9 Score 17 - - - - - 14    Assessment:    Met with member at scheduled time, consent obtained.  This care manager was referred due to frequent falls.  Member's apartment is slightly cluttered, uses motorized wheelchair to get in/out of the door, but uses walker and/or cane in the home.  She has a very small pathway from the door to her bed.  This care manager discussed clearing the walkway, she state she is unable to because she uses the items for her crafts.  She does have home aide daily to help with ADLs.    She is active with orthopedic specialist for her legs/ankles, was told she will need to lose at least another 40 pounds before they will consider surgery.  She would like to lose some weight, but does not want to have surgery.    Denies any urgent concerns, provided with this care manager's contact information, advised to contact with questions.  Plan:   Will follow up with member within the next month.  [facility] CM Care Plan Problem One     Most Recent Value  Care Plan Problem One  Risk of injury related to fall as evidenced by frequent falls  Role Documenting the Problem One  Care Management [place] for Problem One  Active  [facility] Long Term Goal   Member will report weight loss of at least 10 pounds within the next 3 months.  [facility] Long Term Goal Start Date  11/06/17  Interventions for Problem One Long Term Goal  Educated on impact her weight has on her joints/ankles as MD has already advised her to lose weight. Educated on importance of decreasing weight/pressure on joints to decrease risk of fall  [facility] CM Short Term Goal #1   Member will report no falls within the next 4 weeks  [facility] CM Short Term Goal #1 Start Date   11/06/17  Interventions for Short Term Goal #1  Educated on interventions to decrease fall risk (getting rid of clutter, making clear pathways in the home). Provided with checklist for decreasing fall risks.  [facility] CM Short Term Goal #2   Member will keep and attend follow up appointment with primary MD within the next 4 weeks  [facility] CM Short Term Goal #2 Start Date  11/06/17  Interventions for Short Term Goal #2  Educated on importance of close/consistent follow up with primary MD in effort to manage chronic health conditions.  Provided with LCSW phone number, advised to  contact as soon as possible for potential transportation concerns.     Valente David, [place], MSN [place] [phone]

## 2017-11-12 ENCOUNTER — Other Ambulatory Visit: Payer: Self-pay | Admitting: Internal Medicine

## 2017-11-12 DIAGNOSIS — Z1231 Encounter for screening mammogram for malignant neoplasm of breast: Secondary | ICD-10-CM

## 2017-11-18 DIAGNOSIS — I25118 Atherosclerotic heart disease of native coronary artery with other forms of angina pectoris: Secondary | ICD-10-CM | POA: Diagnosis not present

## 2017-11-18 DIAGNOSIS — I10 Essential (primary) hypertension: Secondary | ICD-10-CM | POA: Diagnosis not present

## 2017-11-18 DIAGNOSIS — E785 Hyperlipidemia, unspecified: Secondary | ICD-10-CM | POA: Diagnosis not present

## 2017-11-18 DIAGNOSIS — I252 Old myocardial infarction: Secondary | ICD-10-CM | POA: Diagnosis not present

## 2017-11-18 DIAGNOSIS — N189 Chronic kidney disease, unspecified: Secondary | ICD-10-CM | POA: Diagnosis not present

## 2017-11-22 ENCOUNTER — Other Ambulatory Visit: Payer: Self-pay

## 2017-11-22 DIAGNOSIS — M2550 Pain in unspecified joint: Secondary | ICD-10-CM

## 2017-11-22 MED ORDER — OXYCODONE-ACETAMINOPHEN 10-325 MG PO TABS: 1.0000 | ORAL_TABLET | Freq: Three times a day (TID) | ORAL | 0 refills | Status: DC | PRN

## 2017-11-22 NOTE — Telephone Encounter (Signed)
oxyCODONE-acetaminophen (PERCOCET) 10-325 MG tablet, refill request @  [id number] - [place], [place] - [address] AT [address] [phone] (Phone) [phone] (Fax)    

## 2017-11-22 NOTE — Telephone Encounter (Signed)
Will do before end of the day.  

## 2017-11-26 ENCOUNTER — Other Ambulatory Visit: Payer: Self-pay | Admitting: Internal Medicine

## 2017-11-26 NOTE — Telephone Encounter (Signed)
#  90 sent to [facility] 11/22/2017. Patient made aware. Kinnie Feil, RN, BSN

## 2017-11-26 NOTE — Telephone Encounter (Signed)
Needs refill on oxyCODONE-acetaminophen (PERCOCET) 10-325 MG tablet @ [facility] on [address]   ;pt contact [phone]

## 2017-12-04 ENCOUNTER — Other Ambulatory Visit: Payer: Self-pay

## 2017-12-04 ENCOUNTER — Encounter: Payer: Self-pay | Admitting: Internal Medicine

## 2017-12-04 ENCOUNTER — Ambulatory Visit (INDEPENDENT_AMBULATORY_CARE_PROVIDER_SITE_OTHER): Payer: Medicare Other | Admitting: Internal Medicine

## 2017-12-04 VITALS — BP 146/95 | HR 60 | Temp 98.2°F | Ht 62.0 in | Wt 254.9 lb

## 2017-12-04 DIAGNOSIS — I2510 Atherosclerotic heart disease of native coronary artery without angina pectoris: Secondary | ICD-10-CM

## 2017-12-04 DIAGNOSIS — Z90710 Acquired absence of both cervix and uterus: Secondary | ICD-10-CM

## 2017-12-04 DIAGNOSIS — Z79899 Other long term (current) drug therapy: Secondary | ICD-10-CM

## 2017-12-04 DIAGNOSIS — I10 Essential (primary) hypertension: Secondary | ICD-10-CM

## 2017-12-04 DIAGNOSIS — N189 Chronic kidney disease, unspecified: Secondary | ICD-10-CM

## 2017-12-04 DIAGNOSIS — Z6841 Body Mass Index (BMI) 40.0 and over, adult: Secondary | ICD-10-CM

## 2017-12-04 DIAGNOSIS — Z0000 Encounter for general adult medical examination without abnormal findings: Secondary | ICD-10-CM

## 2017-12-04 DIAGNOSIS — J45909 Unspecified asthma, uncomplicated: Secondary | ICD-10-CM

## 2017-12-04 DIAGNOSIS — M1990 Unspecified osteoarthritis, unspecified site: Secondary | ICD-10-CM

## 2017-12-04 DIAGNOSIS — G8929 Other chronic pain: Secondary | ICD-10-CM

## 2017-12-04 DIAGNOSIS — R32 Unspecified urinary incontinence: Secondary | ICD-10-CM

## 2017-12-04 DIAGNOSIS — K219 Gastro-esophageal reflux disease without esophagitis: Secondary | ICD-10-CM

## 2017-12-04 DIAGNOSIS — J4520 Mild intermittent asthma, uncomplicated: Secondary | ICD-10-CM

## 2017-12-04 DIAGNOSIS — Z86718 Personal history of other venous thrombosis and embolism: Secondary | ICD-10-CM

## 2017-12-04 DIAGNOSIS — E669 Obesity, unspecified: Secondary | ICD-10-CM

## 2017-12-04 DIAGNOSIS — M2550 Pain in unspecified joint: Secondary | ICD-10-CM | POA: Diagnosis not present

## 2017-12-04 DIAGNOSIS — E7800 Pure hypercholesterolemia, unspecified: Secondary | ICD-10-CM

## 2017-12-04 DIAGNOSIS — I129 Hypertensive chronic kidney disease with stage 1 through stage 4 chronic kidney disease, or unspecified chronic kidney disease: Secondary | ICD-10-CM | POA: Diagnosis not present

## 2017-12-04 DIAGNOSIS — E785 Hyperlipidemia, unspecified: Secondary | ICD-10-CM

## 2017-12-04 DIAGNOSIS — Z79891 Long term (current) use of opiate analgesic: Secondary | ICD-10-CM

## 2017-12-04 DIAGNOSIS — F331 Major depressive disorder, recurrent, moderate: Secondary | ICD-10-CM

## 2017-12-04 DIAGNOSIS — Z7901 Long term (current) use of anticoagulants: Secondary | ICD-10-CM

## 2017-12-04 DIAGNOSIS — I82403 Acute embolism and thrombosis of unspecified deep veins of lower extremity, bilateral: Secondary | ICD-10-CM

## 2017-12-04 NOTE — Progress Notes (Signed)
   Subjective:    Patient ID: Vanessa Palmer, female    DOB: Oct 23, 1950, 67 y.o.   MRN: [id number]  CC: 4 month follow up for chronic pain, HTN  HPI   Vanessa Palmer is a 67yo woman with PMH of HTN, DVT on xarelto, CAD, asthma, GERD, OA, CKD, UI, obesity, HLD who presents for follow up.  Vanessa Palmer is not clear on what medications she is taking.  Wellbutrin has fallen off of her list, and she is not sure she is taking it anymore.  She can only name her medications by first initial.  She is also not sure she is taking oxybutynin.  I think she would do well with a medication review.  I have asked her to bring in her pill bottles.  She does not remember ever being on wellbutrin.  Her PHQ-9 today is 17.  She thinks she is taking trazodone.    She notes that her breathing acted up a few weeks ago when it began to get cooler due to her asthma, but this is resolved and improved.    She has chronic pain for which she takes percocet.  She is well managed on a stable dose.  She uses the medications to allow her to do housework and watch after her grandkids.  She reports no red flags of misuse today.  She has not had a UDS in quite a while.      Review of Systems  Constitutional: Negative for activity change, appetite change and unexpected weight change.  Eyes: Negative for visual disturbance.  Respiratory: Negative for cough and shortness of breath.   Cardiovascular: Negative for chest pain and leg swelling.  Genitourinary: Negative for difficulty urinating and dysuria.  Musculoskeletal: Positive for arthralgias, back pain and gait problem. Negative for neck stiffness.  Neurological: Negative for dizziness, weakness and headaches.  Psychiatric/Behavioral: Positive for dysphoric mood. Negative for sleep disturbance. The patient is not nervous/anxious.        Objective:   Physical Exam  Constitutional: She is oriented to person, place, and time. She appears well-developed and well-nourished. No  distress.  Pulmonary/Chest: Effort normal. No respiratory distress.  Musculoskeletal: She exhibits edema (mild) and tenderness.  Neurological: She is alert and oriented to person, place, and time.  Psychiatric: She has a normal mood and affect. Her behavior is normal.  Vitals reviewed.   Lipid panel and Utox today      Assessment & Plan:  RTC in 1 month with pill bottles and for PAP smear.

## 2017-12-04 NOTE — Patient Instructions (Signed)
Vanessa Palmer --   Please keep taking your medications as prescribed.  I would like you to make sure that you are taking amlodipine and metoprolol at home.    Otherwise, please bring in your pill bottles for a medication review in 1 month and we will also do a PAP smear.    Thank you!

## 2017-12-05 ENCOUNTER — Telehealth: Payer: Self-pay | Admitting: *Deleted

## 2017-12-05 LAB — LIPID PANEL
CHOLESTEROL TOTAL: 127 mg/dL (ref 100–199)
Chol/HDL Ratio: 3.4 ratio (ref 0.0–4.4)
HDL: 37 mg/dL — ABNORMAL LOW (ref >39)
LDL Calculated: 73 mg/dL (ref 0–99)
Triglycerides: 86 mg/dL (ref 0–149)
VLDL Cholesterol Cal: 17 mg/dL (ref 5–40)

## 2017-12-05 NOTE — Telephone Encounter (Signed)
Pt calls to tell dr Criselda Peaches that she does not have any of that depression medicine that starts with a B, states its been a long time since she had any

## 2017-12-05 NOTE — Telephone Encounter (Signed)
Thank you!  I will document in my note, please remind her to make an appointment in November for a PAP and for a medication review.  Please have her bring all her medications in.  Thank you!

## 2017-12-06 ENCOUNTER — Encounter: Payer: Self-pay | Admitting: Internal Medicine

## 2017-12-06 NOTE — Assessment & Plan Note (Signed)
MMG: 2018 - negative - due, scheduled Oct 17 PAP: s/p hysterectomy - 11/24/2008 - Pap negative, also noted 2005 negative - will do at next visit in 1 month Flu - discussed, refused Colonoscopy - Reports having done > 5 years ago, discuss at next visit Tetanus - 09/19/10 Pneumovax - will need due to asthma - discuss at next visit.   Assess smoking history at next visit.   Dexa - at 67yo

## 2017-12-06 NOTE — Assessment & Plan Note (Signed)
She reports no leg swelling or pain, she thinks she is compliant with her Xarelto and remembers this name.  No bleeding events noted.   Plan Continue xarelto Medication review at next visit.

## 2017-12-06 NOTE — Assessment & Plan Note (Signed)
She is doing well on flovent.  She notes worsening symptoms at change of season, but this has improved.  Plan - continue flovent.

## 2017-12-06 NOTE — Assessment & Plan Note (Signed)
Went over her medications today.  PHQ-9 was 17.  I asked her about wellbutrin and she became confused, is not sure she was ever on this medication.  She does note that she is taking trazodone for sleep. I would like for her to come in for a medication review before initiating any other medications.   Plan Medication review in 1 month.

## 2017-12-06 NOTE — Telephone Encounter (Signed)
This patient is sch on 01/08/2018 to return to see Dr Criselda Peaches.

## 2017-12-06 NOTE — Assessment & Plan Note (Signed)
BP today was mildly elevated.  Has been controlled on current regimen in the past.  She was very unclear about which medications she was taking.  She thought that she might be taking the metoprolol and she was sure she had the amlodipine.   Plan Continue current regimen Return in 1 month with all medications for review.

## 2017-12-06 NOTE — Assessment & Plan Note (Signed)
She reports good control of her symptoms, but again is unsure if she is taking oxybutynin.   Plan Continue oxybutynin, medication review.

## 2017-12-06 NOTE — Assessment & Plan Note (Signed)
She is on chronic pain medications.  The pain medications allow her to do ADLs around her home and sometimes keep her grandchildren.  She is in a powered chair for activity outside of the home.   Plan Continue Oxycodone-apap 10mg /325mg  TID #90 [place] narcotic database reviewed and appropriate.   Utox today.

## 2017-12-09 ENCOUNTER — Other Ambulatory Visit: Payer: Self-pay | Admitting: Internal Medicine

## 2017-12-09 ENCOUNTER — Other Ambulatory Visit: Payer: Self-pay | Admitting: *Deleted

## 2017-12-09 DIAGNOSIS — I82403 Acute embolism and thrombosis of unspecified deep veins of lower extremity, bilateral: Secondary | ICD-10-CM

## 2017-12-09 NOTE — Patient Outreach (Signed)
[facility]) Care Management  12/09/2017  Vanessa Palmer [birth year] [id number]   Call placed to member to follow up on current health status.  She report she is "making it."  Denies any pain or discomfort at this time, denies any falls since last visit.  She confirms she was seen by her primary MD, discussed preventive visits for pap smear and colonoscopy, will have both done within the next month.    Admit that she has not been actively working on her diet and plan to lose weight but report she will be more mindful of foods she eat.  Denies any urgent concerns at this time, will follow up within the next month.  [facility] CM Care Plan Problem One     Most Recent Value  Care Plan Problem One  Risk of injury related to fall as evidenced by frequent falls  Role Documenting the Problem One  Care Management [place] for Problem One  Active  [facility] Long Term Goal   Member will report weight loss of at least 10 pounds within the next 3 months.  [facility] Long Term Goal Start Date  11/06/17  Interventions for Problem One Long Term Goal  RE-educated member on proper diet in effort to continue on track for weight loss.  Discussed low salt/low carbohydrate & low calorie diet foods.  [facility] CM Short Term Goal #1   Member will report no falls within the next 4 weeks  [facility] CM Short Term Goal #1 Start Date  11/06/17  [facility] CM Short Term Goal #1 Met Date  12/09/17  [facility] CM Short Term Goal #2   Member will keep and attend follow up appointment with primary MD within the next 4 weeks  [facility] CM Short Term Goal #2 Start Date  11/06/17  [facility] CM Short Term Goal #2 Met Date  12/09/17     Valente David, RN, MSN [place] Manager [phone]

## 2017-12-11 LAB — TOXASSURE SELECT,+ANTIDEPR,UR

## 2017-12-12 ENCOUNTER — Ambulatory Visit: Payer: Self-pay

## 2017-12-20 ENCOUNTER — Other Ambulatory Visit: Payer: Self-pay | Admitting: Internal Medicine

## 2017-12-21 DIAGNOSIS — J45901 Unspecified asthma with (acute) exacerbation: Secondary | ICD-10-CM | POA: Diagnosis not present

## 2017-12-21 DIAGNOSIS — M1990 Unspecified osteoarthritis, unspecified site: Secondary | ICD-10-CM | POA: Diagnosis not present

## 2017-12-21 DIAGNOSIS — R609 Edema, unspecified: Secondary | ICD-10-CM | POA: Diagnosis not present

## 2017-12-25 ENCOUNTER — Other Ambulatory Visit: Payer: Self-pay | Admitting: *Deleted

## 2017-12-25 DIAGNOSIS — M2550 Pain in unspecified joint: Secondary | ICD-10-CM

## 2017-12-25 MED ORDER — OXYCODONE-ACETAMINOPHEN 10-325 MG PO TABS: 1.0000 | ORAL_TABLET | Freq: Three times a day (TID) | ORAL | 0 refills | Status: DC | PRN

## 2018-01-08 ENCOUNTER — Other Ambulatory Visit: Payer: Self-pay

## 2018-01-08 ENCOUNTER — Encounter: Payer: Self-pay | Admitting: Internal Medicine

## 2018-01-08 ENCOUNTER — Other Ambulatory Visit (HOSPITAL_COMMUNITY)
Admission: RE | Admit: 2018-01-08 | Discharge: 2018-01-08 | Disposition: A | Discharge status: Home / Self Care | Payer: Medicare Other | Source: Ambulatory Visit | Attending: Internal Medicine | Admitting: Internal Medicine

## 2018-01-08 ENCOUNTER — Ambulatory Visit (INDEPENDENT_AMBULATORY_CARE_PROVIDER_SITE_OTHER): Payer: Medicare Other | Admitting: Internal Medicine

## 2018-01-08 DIAGNOSIS — J45909 Unspecified asthma, uncomplicated: Secondary | ICD-10-CM | POA: Diagnosis not present

## 2018-01-08 DIAGNOSIS — G8929 Other chronic pain: Secondary | ICD-10-CM

## 2018-01-08 DIAGNOSIS — E785 Hyperlipidemia, unspecified: Secondary | ICD-10-CM | POA: Diagnosis not present

## 2018-01-08 DIAGNOSIS — I10 Essential (primary) hypertension: Secondary | ICD-10-CM

## 2018-01-08 DIAGNOSIS — I2510 Atherosclerotic heart disease of native coronary artery without angina pectoris: Secondary | ICD-10-CM

## 2018-01-08 DIAGNOSIS — F1011 Alcohol abuse, in remission: Secondary | ICD-10-CM

## 2018-01-08 DIAGNOSIS — M1990 Unspecified osteoarthritis, unspecified site: Secondary | ICD-10-CM | POA: Diagnosis not present

## 2018-01-08 DIAGNOSIS — Z79899 Other long term (current) drug therapy: Secondary | ICD-10-CM

## 2018-01-08 DIAGNOSIS — Z0000 Encounter for general adult medical examination without abnormal findings: Secondary | ICD-10-CM

## 2018-01-08 DIAGNOSIS — J4520 Mild intermittent asthma, uncomplicated: Secondary | ICD-10-CM

## 2018-01-08 DIAGNOSIS — I119 Hypertensive heart disease without heart failure: Secondary | ICD-10-CM

## 2018-01-08 DIAGNOSIS — Z7901 Long term (current) use of anticoagulants: Secondary | ICD-10-CM

## 2018-01-08 DIAGNOSIS — F419 Anxiety disorder, unspecified: Secondary | ICD-10-CM

## 2018-01-08 DIAGNOSIS — F331 Major depressive disorder, recurrent, moderate: Secondary | ICD-10-CM

## 2018-01-08 MED ORDER — ATORVASTATIN CALCIUM 40 MG PO TABS: 40.0000 mg | ORAL_TABLET | Freq: Every day | ORAL | 3 refills | Status: DC

## 2018-01-08 MED ORDER — SERTRALINE HCL 25 MG PO TABS: 25.0000 mg | ORAL_TABLET | Freq: Every day | ORAL | 3 refills | Status: DC

## 2018-01-08 NOTE — Assessment & Plan Note (Signed)
She has been off her wellbutrin for an unknown amount of time.  I am not sure what occurred with that medication, but she does not have it with her today.   Plan Start sertraline 25mg  Follow up in 4 weeks for dose increase and evaluation.

## 2018-01-08 NOTE — Assessment & Plan Note (Signed)
She does not have atorvastatin with her.  Given her history of LVH and CAD, she should be on a statin.  Last LDL in October was < 100.   Plan Restart atorvastatin at previous dose of 40mg  daily.

## 2018-01-08 NOTE — Assessment & Plan Note (Signed)
BP is elevated today.  She is taking cold medicine which I think is the culprit today.  She is taking it twice a day.  She will be coming back in 4 weeks for evaluation of her anti-depressant and we can check her BP at that time.  She has 2 medications with her today, metoprolol 50mg  Qdaily and amlodipine 5mg  Qdaily.  She is supposed to be on 10mg  of amlodipine so this may be playing a part as well.   Plan Return to clinic in 4 weeks for recheck Increase amlodipine to 10mg  if still elevated.

## 2018-01-08 NOTE — Assessment & Plan Note (Signed)
She is going to AA and meeting with a group. She screened + for moderate depression in their group meeting and is amenable to starting therapy today.    Plan Sertraline 25mg  Qdaily

## 2018-01-08 NOTE — Assessment & Plan Note (Signed)
She has a cold today, but no increased SOB or wheezing on my exam.  I think she likely has a moderate bronchitis, which is most likely viral in nature.  She will call back in the next week if she is not improving or worsening and I can prescribe her an antibiotic.

## 2018-01-08 NOTE — Progress Notes (Signed)
   Subjective:    Patient ID: Vanessa Palmer, female    DOB: September 10, 1950, 67 y.o.   MRN: [id number]  CC: 1 month follow up for medication review  HPI  Vanessa Palmer is a 67yo woman with PMH of CAD, LVH, HLD, OA and chronic pain who presents for medication review.  She further has a diagnosis of MDD, but reports not taking her wellbutrin for some time.  She brought in her medications today and missing from our list were atorvastatin, buproprion and nitroglycerin PRN.  She states she "may" have atorvastatin at home, but then also reports that these are "all" of her medications, so I am not sure.  I am clear based on our telephone communication that she is not on buproprion.  She brought in a packet from her AA meeting which showed a positive screen for depression, moderate and she scored an 18 on the PHQ-9 today.  She is interested in treatment for depression.  She has never been on Prozac or Zoloft before.   She also reports 1 week of cough.  She notes that it started about a week ago, accompanied by morning sputum which is blood streaked.  She has no fever, chills or sick contacts.  She has some mild chest pain when coughing and occasional wheezing.  She has a history of asthma.  She has no SOB except when coughing.    We did a PAP smear today. She is 7065, if this is normal, would graduate her from further PAP smears.   Medications she brought in -   Omeprazole 20mg  daily Trazodone 50mg  1-2 qhs Metoprolol 50mg  qdaily Oxybutynin 5mg  BID Amlodipine 5mg  Qdaily - supposed to be on 10mg  Lasix 40mg  Qdaily Xarelto 20mg  qdaily Flovent inhaler Flonase nasal spray  Review of Systems  Constitutional: Negative for activity change and appetite change.  Respiratory: Positive for cough, chest tightness and wheezing. Negative for shortness of breath.   Cardiovascular: Negative for chest pain and leg swelling.  Genitourinary: Negative for pelvic pain, vaginal discharge and vaginal pain.  Musculoskeletal:  Negative for arthralgias, back pain and myalgias.  Psychiatric/Behavioral: Positive for decreased concentration and dysphoric mood. The patient is nervous/anxious.        Objective:   Physical Exam  Constitutional: She is oriented to person, place, and time. She appears well-developed and well-nourished. No distress.  HENT:  Head: Normocephalic and atraumatic.  Cardiovascular: Normal rate.  No murmur heard. Pulmonary/Chest: Effort normal and breath sounds normal. She has no wheezes. She has no rales.  Abdominal: Soft. She exhibits no mass.  Genitourinary: Vagina normal. No labial fusion. There is no rash, tenderness or lesion on the right labia. There is no rash, tenderness or lesion on the left labia. No erythema or bleeding in the vagina. No vaginal discharge found.  Lymphadenopathy: No inguinal adenopathy noted on the right or left side.  Neurological: She is alert and oriented to person, place, and time.  Psychiatric: She has a normal mood and affect. Her behavior is normal.  Vitals reviewed.   Pap sent today.      Assessment & Plan:  RTC in 6 weeks for evaluation of depression medication - ACC okay.

## 2018-01-08 NOTE — Assessment & Plan Note (Signed)
PAP smear today with HPV.  If negative, she will graduate from further PAP smears in future.

## 2018-01-08 NOTE — Patient Instructions (Signed)
Vanessa Palmer - -  Please START taking your atorvastatin again.  [facility]  Please START sertraline 25mg  daily for your depression.   Please come back in 4-6 weeks to the Acute Care Clinic to see how you are doing on this new medication.   Thank you!  Sertraline tablets What is this medicine? SERTRALINE (SER tra leen) is used to treat depression. It may also be used to treat obsessive compulsive disorder, panic disorder, post-trauma stress, premenstrual dysphoric disorder (PMDD) or social anxiety. This medicine may be used for other purposes; ask your health care provider or pharmacist if you have questions. COMMON BRAND NAME(S): Zoloft What should I tell my health care provider before I take this medicine? They need to know if you have any of these conditions: -bleeding disorders -bipolar disorder or a family history of bipolar disorder -glaucoma -heart disease -high blood pressure -history of irregular heartbeat -history of low levels of calcium, magnesium, or potassium in the blood -if you often drink alcohol -liver disease -receiving electroconvulsive therapy -seizures -suicidal thoughts, plans, or attempt; a previous suicide attempt by you or a family member -take medicines that treat or prevent blood clots -thyroid disease -an unusual or allergic reaction to sertraline, other medicines, foods, dyes, or preservatives -pregnant or trying to get pregnant -breast-feeding How should I use this medicine? Take this medicine by mouth with a glass of water. Follow the directions on the prescription label. You can take it with or without food. Take your medicine at regular intervals. Do not take your medicine more often than directed. Do not stop taking this medicine suddenly except upon the advice of your doctor. Stopping this medicine too quickly may cause serious side effects or your condition may worsen. A special MedGuide will be given to you  by the pharmacist with each prescription and refill. Be sure to read this information carefully each time. Talk to your pediatrician regarding the use of this medicine in children. While this drug may be prescribed for children as young as 7 years for selected conditions, precautions do apply. Overdosage: If you think you have taken too much of this medicine contact a poison control center or emergency room at once. NOTE: This medicine is only for you. Do not share this medicine with others. What if I miss a dose? If you miss a dose, take it as soon as you can. If it is almost time for your next dose, take only that dose. Do not take double or extra doses. What may interact with this medicine? Do not take this medicine with any of the following medications: -cisapride -dofetilide -dronedarone -linezolid -MAOIs like Carbex, Eldepryl, Marplan, Nardil, and Parnate -methylene blue (injected into a vein) -pimozide -thioridazine This medicine may also interact with the following medications: -alcohol -amphetamines -aspirin and aspirin-like medicines -certain medicines for depression, anxiety, or psychotic disturbances -certain medicines for fungal infections like ketoconazole, fluconazole, posaconazole, and itraconazole -certain medicines for irregular heart beat like flecainide, quinidine, propafenone -certain medicines for migraine headaches like almotriptan, eletriptan, frovatriptan, naratriptan, rizatriptan, sumatriptan, zolmitriptan -certain medicines for sleep -certain medicines for seizures like carbamazepine, valproic acid, phenytoin -certain medicines that treat or prevent blood clots like warfarin, enoxaparin, dalteparin -cimetidine -digoxin -diuretics -fentanyl -isoniazid -lithium -NSAIDs, medicines for pain and inflammation, like ibuprofen or naproxen -other medicines that prolong the QT interval (cause an abnormal heart rhythm) -rasagiline -safinamide -supplements like St.  John's wort, kava kava, valerian -tolbutamide -tramadol -tryptophan This list may  not describe all possible interactions. Give your health care provider a list of all the medicines, herbs, non-prescription drugs, or dietary supplements you use. Also tell them if you smoke, drink alcohol, or use illegal drugs. Some items may interact with your medicine. What should I watch for while using this medicine? Tell your doctor if your symptoms do not get better or if they get worse. Visit your doctor or health care professional for regular checks on your progress. Because it may take several weeks to see the full effects of this medicine, it is important to continue your treatment as prescribed by your doctor. Patients and their families should watch out for new or worsening thoughts of suicide or depression. Also watch out for sudden changes in feelings such as feeling anxious, agitated, panicky, irritable, hostile, aggressive, impulsive, severely restless, overly excited and hyperactive, or not being able to sleep. If this happens, especially at the beginning of treatment or after a change in dose, call your health care professional. Vanessa Palmer may get drowsy or dizzy. Do not drive, use machinery, or do anything that needs mental alertness until you know how this medicine affects you. Do not stand or sit up quickly, especially if you are an older patient. This reduces the risk of dizzy or fainting spells. Alcohol may interfere with the effect of this medicine. Avoid alcoholic drinks. Your mouth may get dry. Chewing sugarless gum or sucking hard candy, and drinking plenty of water may help. Contact your doctor if the problem does not go away or is severe. What side effects may I notice from receiving this medicine? Side effects that you should report to your doctor or health care professional as soon as possible: -allergic reactions like skin rash, itching or hives, swelling of the face, lips, or  tongue -anxious -black, tarry stools -changes in vision -confusion -elevated mood, decreased need for sleep, racing thoughts, impulsive behavior -eye pain -fast, irregular heartbeat -feeling faint or lightheaded, falls -feeling agitated, angry, or irritable -hallucination, loss of contact with reality -loss of balance or coordination -loss of memory -painful or prolonged erections -restlessness, pacing, inability to keep still -seizures -stiff muscles -suicidal thoughts or other mood changes -trouble sleeping -unusual bleeding or bruising -unusually weak or tired -vomiting Side effects that usually do not require medical attention (report to your doctor or health care professional if they continue or are bothersome): -change in appetite or weight -change in sex drive or performance -diarrhea -increased sweating -indigestion, nausea -tremors This list may not describe all possible side effects. Call your doctor for medical advice about side effects. You may report side effects to FDA at 1-800-FDA-1088. Where should I keep my medicine? Keep out of the reach of children. Store at room temperature between 15 and 30 degrees C (59 and 86 degrees F). Throw away any unused medicine after the expiration date. NOTE: This sheet is a summary. It may not cover all possible information. If you have questions about this medicine, talk to your doctor, pharmacist, or health care provider.  2018 Elsevier/Gold Standard (2016-02-17 14:17:49)

## 2018-01-10 ENCOUNTER — Other Ambulatory Visit: Payer: Self-pay | Admitting: *Deleted

## 2018-01-10 LAB — CYTOLOGY - PAP
Adequacy: ABSENT
Diagnosis: NEGATIVE
HPV (WINDOPATH): NOT DETECTED

## 2018-01-10 NOTE — Patient Outreach (Signed)
[facility]) Care Management  01/10/2018  Loni Musedell D Brahm Jan 12, 1951 [id number]   Call placed to member to follow up on current health status, no answer.  HIPAA compliant voice message left.  Will follow up within the next 4 business days with 2nd attempt.  Kemper DurieMonica Wyeth Hoffer, CaliforniaRN, MSN [facility] Care Management  [facility] Care Manager [phone]

## 2018-01-16 ENCOUNTER — Other Ambulatory Visit: Payer: Self-pay | Admitting: *Deleted

## 2018-01-16 NOTE — Patient Outreach (Signed)
[facility]) Care Management  01/16/2018  Loni Musedell D Rafferty [birth year] [id number]   Call placed to member to follow up on current health status.  She report she was seen by her primary MD on 11/13, state she has lost 4 pounds. She was placed on Sertraline for depression, state it has given her an increase in energy.  Will have follow up within the next 4 weeks with MD.  Orlene Ermonversation cut short due to member having to get on bus, will follow up within the next 2 weeks if member does not return call.  Will help schedule follow up visit with MD if not scheduled at that time.  Will also review more patient related goals at that time.  [facility] CM Care Plan Problem One     Most Recent Value  Care Plan Problem One  Risk of injury related to fall as evidenced by frequent falls  Role Documenting the Problem One  Care Management Coordinator  Care Plan for Problem One  Active  [facility] Long Term Goal   Member will report weight loss of at least 10 pounds within the next 3 months.  [facility] Long Term Goal Start Date  11/06/17  Interventions for Problem One Long Term Goal  Educated member on importance of maintaining energy level in effort to increase mobility for exercise in effort to decrease weight     Kemper DurieMonica Millianna Szymborski, Charity fundraiserN, MSN [facility] Care Management  [facility] Care Manager [phone]

## 2018-01-20 ENCOUNTER — Encounter: Payer: Self-pay | Admitting: *Deleted

## 2018-01-20 NOTE — Telephone Encounter (Signed)
This encounter was created in error - please disregard.

## 2018-01-22 ENCOUNTER — Encounter: Payer: Self-pay | Admitting: Internal Medicine

## 2018-01-27 ENCOUNTER — Other Ambulatory Visit: Payer: Self-pay | Admitting: Internal Medicine

## 2018-01-27 DIAGNOSIS — M2550 Pain in unspecified joint: Secondary | ICD-10-CM

## 2018-01-27 NOTE — Telephone Encounter (Signed)
Refill Request   oxyCODONE-acetaminophen (PERCOCET) 10-325 MG tablet   [id number] - [place], [place] - [address] 

## 2018-01-28 MED ORDER — OXYCODONE-ACETAMINOPHEN 10-325 MG PO TABS: 1.0000 | ORAL_TABLET | Freq: Three times a day (TID) | ORAL | 0 refills | Status: DC | PRN

## 2018-01-28 NOTE — Telephone Encounter (Signed)
Reviewed chart, [place] narcotic database appropriate.  Will place order for Refill X 3 and send electronically.  Thank you

## 2018-01-31 ENCOUNTER — Other Ambulatory Visit: Payer: Self-pay | Admitting: *Deleted

## 2018-01-31 NOTE — Patient Outreach (Signed)
[facility]) Care Management  01/31/2018  Vanessa Palmer November 30, 1950 [id number]   Call placed to member to follow up on current health status.  She report she is doing well, denies any recent falls.  State she is still working on her weight loss, weight down to 248 when last checked.  State blood pressure is also down, last blood pressure was 139/89.  Denies any urgent concerns.  Discussed transition to health coach, she agrees.  Will place referral and notify primary MD of transition.  Kemper DurieMonica Ranferi Clingan, [place]RN, MSN [facility] Care Management  [facility] Care Manager [phone]

## 2018-02-01 DIAGNOSIS — J45901 Unspecified asthma with (acute) exacerbation: Secondary | ICD-10-CM | POA: Diagnosis not present

## 2018-02-01 DIAGNOSIS — M1990 Unspecified osteoarthritis, unspecified site: Secondary | ICD-10-CM | POA: Diagnosis not present

## 2018-02-01 DIAGNOSIS — R609 Edema, unspecified: Secondary | ICD-10-CM | POA: Diagnosis not present

## 2018-02-24 ENCOUNTER — Other Ambulatory Visit: Payer: Self-pay | Admitting: Internal Medicine

## 2018-02-24 ENCOUNTER — Other Ambulatory Visit: Payer: Self-pay

## 2018-02-24 DIAGNOSIS — M2550 Pain in unspecified joint: Secondary | ICD-10-CM

## 2018-02-24 DIAGNOSIS — I82403 Acute embolism and thrombosis of unspecified deep veins of lower extremity, bilateral: Secondary | ICD-10-CM

## 2018-02-24 MED ORDER — OXYCODONE-ACETAMINOPHEN 10-325 MG PO TABS: 1.0000 | ORAL_TABLET | Freq: Three times a day (TID) | ORAL | 0 refills | Status: DC | PRN

## 2018-02-24 NOTE — Telephone Encounter (Signed)
oxyCODONE-acetaminophen (PERCOCET) 10-325 MG tablet, refill request @  [id number] - [place], [place] - [address] [phone] (Phone) [phone] (Fax)    

## 2018-02-24 NOTE — Telephone Encounter (Signed)
Reviewed [place] PMP database

## 2018-03-06 ENCOUNTER — Other Ambulatory Visit: Payer: Self-pay

## 2018-03-06 NOTE — Patient Outreach (Signed)
[facility]) Care Management  03/06/2018   Vanessa Palmer 1951/02/04 [id number]       Outreach attempt # 1 to the patient for initial assessment. HIPAA verified with patient.  Discussed and offered [facility] care management services with patient. Patient verbally agreed to services. Patient was able to partially complete the assessment.  She did not have a list of her medications and could not confirm them. Notified the patient that I will call her back to confirm medications.  Social: The patient lives in the home alone. The patient states that she is independent/assist with her ADLS/IADLS. She has transportation to appointments. The patient states that she has chronic pain in her back and knees.  She rates that pain at a 6/10.  The patient states that she has had several falls in the last year because of unstable gate.  She states that she did not injure herself. Discussed fall precautions with the patient and she verbalized understanding.  Durable medical equipment in the home consist of: wheel chair, cane, walker, blood pressure cuff and glasses.  Conditions: Per chart review and speaking with the patient her conditions include:  HTN, OSA, Osteoarthritis, CKD stage III, Hyperlipidemia Gout and Depression.  The patient states that she does not check her blood pressure daily.  She states " I check it sometimes".  I discussed with her the importance of checking her blood pressure so she can keep a record and see trends.  She states that she does not follow any type of diet.  Discussed with the patient about diet and how it affects her blood pressure.  We discussed salt intake and reading labels. I notified the patient that I will be sending her information about food intake reading labels and dash diet. The patient verbalized understanding  Medications: Per chart review the patient is on 14 medications.  She states that she is independent/assist with her medications.  She did not  express any concerns with paying for her medications.  Appointments: The patient has an appointment to see her physician on 03/19/2018  Advanced Directives: The patient states that she does not have an advanced directive and would like for me to send her the information.    Current Medications:  Current Outpatient Medications  Medication Sig Dispense Refill  . amLODipine (NORVASC) 10 MG tablet Take 1 tablet (10 mg total) by mouth daily. 90 tablet 3  . atorvastatin (LIPITOR) 40 MG tablet Take 1 tablet (40 mg total) by mouth daily at 6 PM. 90 tablet 3  . fluticasone (FLONASE) 50 MCG/ACT nasal spray INSTILL TWO SPRAY IN EACH NOSTRIL DAILY AS NEEDED FOR ALLERGIES 16 g 2  . fluticasone (FLOVENT HFA) 44 MCG/ACT inhaler Inhale 2 puffs into the lungs 2 (two) times daily. 1 Inhaler 12  . furosemide (LASIX) 40 MG tablet TAKE ONE TABLET BY MOUTH DAILY 90 tablet 1  . metoprolol succinate (TOPROL-XL) 50 MG 24 hr tablet Take 50 mg by mouth daily.    . nitroGLYCERIN (NITROSTAT) 0.4 MG SL tablet Place 1 tablet (0.4 mg total) under the tongue every 5 (five) minutes as needed for chest pain. 30 tablet 1  . omeprazole (PRILOSEC) 20 MG capsule Take 1 capsule (20 mg total) by mouth daily. 90 capsule 3  . oxybutynin (DITROPAN) 5 MG tablet TAKE ONE TABLET BY MOUTH TWICE A DAY 60 tablet 1  . oxyCODONE-acetaminophen (PERCOCET) 10-325 MG tablet Take 1 tablet by mouth every 8 (eight) hours as needed for pain (chronic arthritis pain,  multiple sites, diag code M16.11, M25.50). 90 tablet 0  . sertraline (ZOLOFT) 25 MG tablet Take 1 tablet (25 mg total) by mouth daily. 30 tablet 3  . traZODone (DESYREL) 50 MG tablet TAKE 1 TO TWO TABLETS (50-100 MG TOTAL) BY MOUTH AT BEDTIME 45 tablet 5  . XARELTO 20 MG TABS tablet TAKE ONE TABLET BY MOUTH DAILY WITH SUPPER 30 tablet 1  . [DISCONTINUED] furosemide (LASIX) 20 MG tablet Take one half of the tablet once daily for 5 days. Then stop 10 tablet 0   No current  facility-administered medications for this visit.     Functional Status:  In your present state of health, do you have any difficulty performing the following activities: 03/06/2018 01/08/2018  Hearing? N N  Vision? Y N  Difficulty concentrating or making decisions? Y N  Walking or climbing stairs? Y Y  Dressing or bathing? Y Y  Doing errands, shopping? Y Y  Comment - -  Preparing Food and eating ? - -  Using the Toilet? - -  In the past six months, have you accidently leaked urine? - -  Do you have problems with loss of bowel control? - -  Managing your Medications? - -  Managing your Finances? - -  Housekeeping or managing your Housekeeping? - -  Some recent data might be hidden    Fall/Depression Screening: Fall Risk  03/06/2018 01/08/2018 12/04/2017  Falls in the past year? 1 1 No  Number falls in past yr: 1 1 -  Injury with Fall? 0 0 -  Risk Factor Category  - - -  Comment - - -  Risk for fall due to : Impaired balance/gait History of fall(s);Impaired balance/gait;Impaired mobility -  Risk for fall due to: Comment - - -  Follow up Falls evaluation completed;Education provided Falls prevention discussed -   PHQ 2/9 Scores 03/06/2018 01/08/2018 12/04/2017 10/17/2017 08/07/2017 05/01/2017 01/23/2017  PHQ - 2 Score 3 5 5 3  0 0 1  PHQ- 9 Score 12 18 17 17  - - -    Assessment: Patient will benefit from health coach outreach for disease management and support.   [facility] CM Care Plan Problem One     Most Recent Value  Care Plan Problem One  knowledge deficit related to diease management of HTN  Role Documenting the Problem One  Health Coach  Care Plan for Problem One  Active  [facility] Long Term Goal   Member  wil report some weight loss of 1-2 pounds and checking blood pressures daily in 30 days  [facility] Long Term Goal Start Date  03/06/18  Interventions for Problem One Long Term Goal  Discussed decrease salt the diet eating health foods, monitoring blood pressure, manage stress.         Plan: RN Health Coach will provide ongoing education for patient on hypertension through phone calls and sending printed information to patient for further discussion.  RN Health Coach will send welcome packet with consent to patient as well as printed information on hypertension.  RN Health Coach will send initial barriers letter, assessment, and care plan to primary care physician. RN Health Coach will contact patient in the month of February and patient agrees to next outreach.    Juanell Fairlyraci Malakhai Beitler RN, BSN, [facility] RN Health Coach Disease Management [facility] Direct Dial:  [phone]  Fax: [phone]

## 2018-03-08 ENCOUNTER — Observation Stay (HOSPITAL_COMMUNITY)
Admission: EM | Admit: 2018-03-08 | Discharge: 2018-03-09 | Disposition: A | Discharge status: Home / Self Care | Payer: Medicare Other | Attending: Internal Medicine | Admitting: Internal Medicine

## 2018-03-08 ENCOUNTER — Emergency Department (HOSPITAL_COMMUNITY): Payer: Medicare Other

## 2018-03-08 ENCOUNTER — Encounter (HOSPITAL_COMMUNITY): Payer: Self-pay

## 2018-03-08 DIAGNOSIS — G8929 Other chronic pain: Secondary | ICD-10-CM | POA: Insufficient documentation

## 2018-03-08 DIAGNOSIS — K219 Gastro-esophageal reflux disease without esophagitis: Secondary | ICD-10-CM | POA: Diagnosis not present

## 2018-03-08 DIAGNOSIS — I509 Heart failure, unspecified: Secondary | ICD-10-CM | POA: Insufficient documentation

## 2018-03-08 DIAGNOSIS — I10 Essential (primary) hypertension: Secondary | ICD-10-CM | POA: Diagnosis not present

## 2018-03-08 DIAGNOSIS — Z86718 Personal history of other venous thrombosis and embolism: Secondary | ICD-10-CM | POA: Diagnosis not present

## 2018-03-08 DIAGNOSIS — Z79891 Long term (current) use of opiate analgesic: Secondary | ICD-10-CM | POA: Diagnosis not present

## 2018-03-08 DIAGNOSIS — I82403 Acute embolism and thrombosis of unspecified deep veins of lower extremity, bilateral: Secondary | ICD-10-CM | POA: Diagnosis not present

## 2018-03-08 DIAGNOSIS — E785 Hyperlipidemia, unspecified: Secondary | ICD-10-CM | POA: Diagnosis not present

## 2018-03-08 DIAGNOSIS — Z7901 Long term (current) use of anticoagulants: Secondary | ICD-10-CM | POA: Diagnosis not present

## 2018-03-08 DIAGNOSIS — N183 Chronic kidney disease, stage 3 unspecified: Secondary | ICD-10-CM | POA: Diagnosis present

## 2018-03-08 DIAGNOSIS — I130 Hypertensive heart and chronic kidney disease with heart failure and stage 1 through stage 4 chronic kidney disease, or unspecified chronic kidney disease: Secondary | ICD-10-CM | POA: Insufficient documentation

## 2018-03-08 DIAGNOSIS — G4733 Obstructive sleep apnea (adult) (pediatric): Secondary | ICD-10-CM | POA: Diagnosis not present

## 2018-03-08 DIAGNOSIS — I252 Old myocardial infarction: Secondary | ICD-10-CM | POA: Diagnosis not present

## 2018-03-08 DIAGNOSIS — F1011 Alcohol abuse, in remission: Secondary | ICD-10-CM | POA: Diagnosis present

## 2018-03-08 DIAGNOSIS — I2510 Atherosclerotic heart disease of native coronary artery without angina pectoris: Secondary | ICD-10-CM | POA: Diagnosis not present

## 2018-03-08 DIAGNOSIS — R202 Paresthesia of skin: Secondary | ICD-10-CM | POA: Diagnosis not present

## 2018-03-08 DIAGNOSIS — D6859 Other primary thrombophilia: Secondary | ICD-10-CM | POA: Diagnosis present

## 2018-03-08 DIAGNOSIS — R0789 Other chest pain: Secondary | ICD-10-CM | POA: Diagnosis not present

## 2018-03-08 DIAGNOSIS — R200 Anesthesia of skin: Secondary | ICD-10-CM | POA: Diagnosis not present

## 2018-03-08 DIAGNOSIS — Z79899 Other long term (current) drug therapy: Secondary | ICD-10-CM | POA: Diagnosis not present

## 2018-03-08 DIAGNOSIS — R079 Chest pain, unspecified: Principal | ICD-10-CM | POA: Diagnosis present

## 2018-03-08 LAB — CBC
HCT: 35.9 % — ABNORMAL LOW (ref 36.0–46.0)
Hemoglobin: 11.8 g/dL — ABNORMAL LOW (ref 12.0–15.0)
MCH: 30.5 pg (ref 26.0–34.0)
MCHC: 32.9 g/dL (ref 30.0–36.0)
MCV: 92.8 fL (ref 80.0–100.0)
Platelets: 185 10*3/uL (ref 150–400)
RBC: 3.87 MIL/uL (ref 3.87–5.11)
RDW: 12.9 % (ref 11.5–15.5)
WBC: 4.8 10*3/uL (ref 4.0–10.5)
nRBC: 0 % (ref 0.0–0.2)

## 2018-03-08 LAB — BASIC METABOLIC PANEL
Anion gap: 10 (ref 5–15)
BUN: 14 mg/dL (ref 8–23)
CO2: 25 mmol/L (ref 22–32)
Calcium: 8.1 mg/dL — ABNORMAL LOW (ref 8.9–10.3)
Chloride: 103 mmol/L (ref 98–111)
Creatinine, Ser: 1.43 mg/dL — ABNORMAL HIGH (ref 0.44–1.00)
GFR calc Af Amer: 44 mL/min — ABNORMAL LOW (ref >60)
GFR calc non Af Amer: 38 mL/min — ABNORMAL LOW (ref >60)
Glucose, Bld: 106 mg/dL — ABNORMAL HIGH (ref 70–99)
Potassium: 3.7 mmol/L (ref 3.5–5.1)
Sodium: 138 mmol/L (ref 135–145)

## 2018-03-08 LAB — TROPONIN I: Troponin I: <0.03 ng/mL (ref <0.03)

## 2018-03-08 LAB — I-STAT TROPONIN, ED: TROPONIN I, POC: 0 ng/mL (ref 0.00–0.08)

## 2018-03-08 MED ORDER — ALUM & MAG HYDROXIDE-SIMETH 200-200-20 MG/5ML PO SUSP
30.0000 mL | Freq: Once | ORAL | Status: AC
  Administered 2018-03-09: 30 mL via ORAL
  Filled 2018-03-08: qty 30

## 2018-03-08 MED ORDER — ASPIRIN 81 MG PO CHEW
324.0000 mg | CHEWABLE_TABLET | Freq: Once | ORAL | Status: AC
  Administered 2018-03-08: 324 mg via ORAL
  Filled 2018-03-08: qty 4

## 2018-03-08 MED ORDER — METOPROLOL SUCCINATE ER 50 MG PO TB24
50.0000 mg | ORAL_TABLET | Freq: Every day | ORAL | Status: DC
  Administered 2018-03-09: 50 mg via ORAL
  Filled 2018-03-08: qty 1

## 2018-03-08 MED ORDER — ACETAMINOPHEN 500 MG PO TABS: 500.0000 mg | ORAL_TABLET | Freq: Four times a day (QID) | ORAL | Status: DC | PRN

## 2018-03-08 MED ORDER — NITROGLYCERIN 0.4 MG SL SUBL
0.4000 mg | SUBLINGUAL_TABLET | Freq: Once | SUBLINGUAL | Status: AC
  Administered 2018-03-08: 0.4 mg via SUBLINGUAL
  Filled 2018-03-08: qty 1

## 2018-03-08 MED ORDER — OXYCODONE-ACETAMINOPHEN 5-325 MG PO TABS
1.0000 | ORAL_TABLET | Freq: Three times a day (TID) | ORAL | Status: DC | PRN
  Administered 2018-03-09: 1 via ORAL
  Filled 2018-03-08: qty 1

## 2018-03-08 MED ORDER — SERTRALINE HCL 50 MG PO TABS
25.0000 mg | ORAL_TABLET | Freq: Every day | ORAL | Status: DC
  Administered 2018-03-09: 25 mg via ORAL
  Filled 2018-03-08: qty 1

## 2018-03-08 MED ORDER — RIVAROXABAN 20 MG PO TABS
20.0000 mg | ORAL_TABLET | Freq: Every day | ORAL | Status: DC
  Administered 2018-03-09: 20 mg via ORAL
  Filled 2018-03-08: qty 1

## 2018-03-08 MED ORDER — ONDANSETRON HCL 4 MG/2ML IJ SOLN: 4.0000 mg | Freq: Four times a day (QID) | INTRAMUSCULAR | Status: DC | PRN

## 2018-03-08 MED ORDER — OXYCODONE-ACETAMINOPHEN 10-325 MG PO TABS: 1.0000 | ORAL_TABLET | Freq: Three times a day (TID) | ORAL | Status: DC | PRN

## 2018-03-08 MED ORDER — NITROGLYCERIN 0.4 MG SL SUBL: 0.4000 mg | SUBLINGUAL_TABLET | SUBLINGUAL | Status: DC | PRN

## 2018-03-08 MED ORDER — DIPHENHYDRAMINE HCL 25 MG PO CAPS
25.0000 mg | ORAL_CAPSULE | Freq: Four times a day (QID) | ORAL | Status: DC | PRN
  Administered 2018-03-09 (×2): 25 mg via ORAL
  Filled 2018-03-08 (×2): qty 1

## 2018-03-08 MED ORDER — ASPIRIN EC 325 MG PO TBEC
325.0000 mg | DELAYED_RELEASE_TABLET | Freq: Every day | ORAL | Status: DC
  Administered 2018-03-09: 325 mg via ORAL
  Filled 2018-03-08: qty 1

## 2018-03-08 MED ORDER — AMLODIPINE BESYLATE 5 MG PO TABS
5.0000 mg | ORAL_TABLET | Freq: Every day | ORAL | Status: DC
  Administered 2018-03-09: 5 mg via ORAL
  Filled 2018-03-08: qty 1

## 2018-03-08 MED ORDER — ACETAMINOPHEN 325 MG PO TABS: 650.0000 mg | ORAL_TABLET | ORAL | Status: DC | PRN

## 2018-03-08 MED ORDER — FUROSEMIDE 40 MG PO TABS
40.0000 mg | ORAL_TABLET | Freq: Every day | ORAL | Status: DC
  Administered 2018-03-09: 40 mg via ORAL
  Filled 2018-03-08: qty 1

## 2018-03-08 MED ORDER — ATORVASTATIN CALCIUM 40 MG PO TABS: 40.0000 mg | ORAL_TABLET | Freq: Every day | ORAL | Status: DC

## 2018-03-08 MED ORDER — OXYCODONE HCL 5 MG PO TABS
5.0000 mg | ORAL_TABLET | Freq: Three times a day (TID) | ORAL | Status: DC | PRN
  Administered 2018-03-09: 5 mg via ORAL
  Filled 2018-03-08: qty 1

## 2018-03-08 NOTE — H&P (Signed)
History and Physical    TEWANA BOHLEN ZOX:[id number] DOB: 1950/09/02 DOA: 03/08/2018  PCP: Inez Catalina, MD  Patient coming from: home  Chief Complaint: chest pain  HPI: ANGELLEE COHILL is a 68 y.o. female with medical history significant of htn, h/o etoh abuse, chf, ckd chronic pain comes in with sscp radiated down left arm which is improved with one dose of ntg with ems en route.  No n/v.  No fevers no cough. No le edema or swelling.  Not pleuritic.  Pt referred for admission for romi, possible underlying acs.  ED Course: ems given one dose of ntg, relieved pain somewhat.  Pt given asa , continued to have chest pain advised to give further ntg sl in the ED, in process  Review of Systems: As per HPI otherwise 10 point review of systems negative.   Past Medical History:  Diagnosis Date  . ALCOHOL ABUSE 12/13/2005   Annotation: Sober since 11/06 Qualifier: Diagnosis of  By: Wallace Cullens MD, Natalia Leatherwood    . Anemia   . Arthritis    "all over" (02/21/2017)  . CAD (coronary artery disease)    s/p non-Q wave MI in 06/2001 and 2004, 2005  . CHF (congestive heart failure) (HCC)   . Chronic kidney disease (CKD), stage III (moderate) (HCC) 09/19/2010  . Chronic mid back pain   . Depression   . DJD (degenerative joint disease)   . DVT (deep venous thrombosis) (HCC)    BLE  . DVT of lower extremity, bilateral (HCC) 04/04/2012   On Xarelto    . GERD (gastroesophageal reflux disease)   . Gout   . Hyperlipidemia   . Hypertension   . INTRINSIC ASTHMA, WITH EXACERBATION 09/27/2009   Qualifier: Diagnosis of  By: Denton Meek MD, Tillie Rung    . Migraine    "none anymore" (02/21/2017)  . Myocardial infarction [facility]) ?2005  . Obesity   . OSA (obstructive sleep apnea)    previously used CPAP, has lost it (02/21/2017)  . Seizures (HCC)    As a teenager     Past Surgical History:  Procedure Laterality Date  . ABDOMINAL HYSTERECTOMY     partial; due to endometriosis  . CARDIAC CATHETERIZATION  06/2001,  02/2002, 10/2004    (10-15% proximal stenosis of circumflex, diffuse disease of  OM1 and RCA)  . CARDIAC CATHETERIZATION  02/21/2017  . COLONOSCOPY    . JOINT REPLACEMENT    . KNEE ARTHROSCOPY Left   . LEFT HEART CATH AND CORONARY ANGIOGRAPHY N/A 02/21/2017   Procedure: LEFT HEART CATH AND CORONARY ANGIOGRAPHY;  Surgeon: Rinaldo Cloud, MD;  Location: [facility] INVASIVE CV LAB;  Service: Cardiovascular;  Laterality: N/A;  . TOTAL HIP ARTHROPLASTY Right 11/23/2014   Procedure: RIGHT TOTAL HIP ARTHROPLASTY ANTERIOR APPROACH;  Surgeon: Kathryne Hitch, MD;  Location: [facility] OR;  Service: Orthopedics;  Laterality: Right;  . TUBAL LIGATION       reports that she has never smoked. She has never used smokeless tobacco. She reports previous alcohol use of about 12.0 standard drinks of alcohol per week. She reports that she does not use drugs.  Allergies  Allergen Reactions  . Ace Inhibitors Swelling and Other (See Comments)     Angioedema   . Regadenoson Hives    LEXISCAN  . Shrimp [Shellfish Allergy] Anaphylaxis, Shortness Of Breath and Rash    Had to be taken to the ED when she was exposed to this  . Peanut Oil Itching, Rash and Other (See Comments)  Welts, also  . Hydrocodone-Acetaminophen Itching  . Hydromorphone Hcl Rash and Other (See Comments)    Diffuse rash  . Hydromorphone Hcl Rash and Other (See Comments)    Diffuse rash  . Oxycodone-Acetaminophen Itching    Tolerates with Benadryl  . Percocet [Oxycodone-Acetaminophen] Itching    Can tolerate with Benadryl  . Propoxyphene N-Acetaminophen Itching  . Vicodin [Hydrocodone-Acetaminophen] Itching    Family History  Problem Relation Age of Onset  . Mental illness Mother   . Heart disease Father   . Diabetes Sister   . Diabetes Sister   . Diabetes Sister     Prior to Admission medications   Medication Sig Start Date End Date Taking? Authorizing Provider  acetaminophen (TYLENOL) 500 MG tablet Take 500-1,000 mg by mouth every 6  (six) hours as needed (for pain).   Yes [provider]  amLODipine (NORVASC) 5 MG tablet Take 5 mg by mouth daily.   Yes [provider]  atorvastatin (LIPITOR) 40 MG tablet Take 1 tablet (40 mg total) by mouth daily at 6 PM. Patient taking differently: Take 40 mg by mouth daily.  01/08/18  Yes [name], [name], MD  fluticasone (FLONASE) 50 MCG/ACT nasal spray INSTILL TWO SPRAY IN EACH NOSTRIL DAILY AS NEEDED FOR ALLERGIES Patient taking differently: Place 2 sprays into both nostrils daily as needed for allergies.  12/10/17  Yes [name], [name], MD  fluticasone (FLOVENT HFA) 44 MCG/ACT inhaler Inhale 2 puffs into the lungs 2 (two) times daily. Patient taking differently: Inhale 2 puffs into the lungs 2 (two) times daily as needed ("for flares").  09/02/17  Yes [name], [name], MD  furosemide (LASIX) 40 MG tablet TAKE ONE TABLET BY MOUTH DAILY Patient taking differently: Take 40 mg by mouth daily.  12/24/17  Yes [name], [name], MD  metoprolol succinate (TOPROL-XL) 50 MG 24 hr tablet Take 50 mg by mouth daily. 01/21/17  Yes [provider]  nitroGLYCERIN (NITROSTAT) 0.4 MG SL tablet Place 1 tablet (0.4 mg total) under the tongue every 5 (five) minutes as needed for chest pain. 09/02/17  Yes [name], [name], MD  omeprazole (PRILOSEC) 20 MG capsule Take 1 capsule (20 mg total) by mouth daily. 05/07/17  Yes Doneen PoissonKlima, Lawrence, MD  oxybutynin (DITROPAN) 5 MG tablet TAKE ONE TABLET BY MOUTH TWICE A DAY Patient taking differently: Take 5 mg by mouth 2 (two) times daily.  02/26/18  Yes [name], [name], MD  oxyCODONE-acetaminophen (PERCOCET) 10-325 MG tablet Take 1 tablet by mouth every 8 (eight) hours as needed for pain (chronic arthritis pain, multiple sites, diag code M16.11, M25.50). 02/27/18 03/29/18 Yes Gust RungHoffman, Erik C, DO  sertraline (ZOLOFT) 25 MG tablet Take 1 tablet (25 mg total) by mouth daily. 01/08/18  Yes [name], [name], MD  traZODone (DESYREL) 50 MG tablet TAKE 1 TO TWO TABLETS  (50-100 MG TOTAL) BY MOUTH AT BEDTIME Patient taking differently: Take 50-100 mg by mouth at bedtime as needed for sleep.  10/01/17  Yes [name], [name], MD  XARELTO 20 MG TABS tablet TAKE ONE TABLET BY MOUTH DAILY WITH SUPPER Patient taking differently: Take 20 mg by mouth daily with breakfast.  02/26/18  Yes [name], [name], MD  amLODipine (NORVASC) 10 MG tablet Take 1 tablet (10 mg total) by mouth daily. Patient not taking: Reported on 03/08/2018 08/07/17   [name], [name], MD  furosemide (LASIX) 20 MG tablet Take one half of the tablet once daily for 5 days. Then stop 09/19/10 11/29/19  Denna Haggard, MD    Physical Exam: Vitals:   03/08/18 1957 03/08/18 2100 03/08/18 2115 03/08/18 2200  BP:  132/77 121/70   Pulse:  (!) 55 (!) 58 (!) 56  Resp:  16 17 15   Temp: 98.3 F (36.8 C)     SpO2:  99% 97% 100%      Constitutional: NAD, calm, comfortable Vitals:   03/08/18 1957 03/08/18 2100 03/08/18 2115 03/08/18 2200  BP:  132/77 121/70   Pulse:  (!) 55 (!) 58 (!) 56  Resp:  16 17 15   Temp: 98.3 F (36.8 C)     SpO2:  99% 97% 100%   Eyes: PERRL, lids and conjunctivae normal ENMT: Mucous membranes are moist. Posterior pharynx clear of any exudate or lesions.Normal dentition.  Neck: normal, supple, no masses, no thyromegaly Respiratory: clear to auscultation bilaterally, no wheezing, no crackles. Normal respiratory effort. No accessory muscle use.  Cardiovascular: Regular rate and rhythm, no murmurs / rubs / gallops. No extremity edema. 2+ pedal pulses. No carotid bruits.  Abdomen: no tenderness, no masses palpated. No hepatosplenomegaly. Bowel sounds positive.  Musculoskeletal: no clubbing / cyanosis. No joint deformity upper and lower extremities. Good ROM, no contractures. Normal muscle tone.  Skin: no rashes, lesions, ulcers. No induration Neurologic: CN 2-12 grossly intact. Sensation intact, DTR normal. Strength 5/5 in all 4.  Psychiatric: Normal judgment and insight. Alert and  oriented x 3. Normal mood.    Labs on Admission: I have personally reviewed following labs and imaging studies  CBC: Recent Labs  Lab 03/08/18 1947  WBC 4.8  HGB 11.8*  HCT 35.9*  MCV 92.8  PLT 185   Basic Metabolic Panel: Recent Labs  Lab 03/08/18 1947  NA 138  K 3.7  CL 103  CO2 25  GLUCOSE 106*  BUN 14  CREATININE 1.43*  CALCIUM 8.1*   GFR: CrCl cannot be calculated (Unknown ideal weight.). Liver Function Tests: No results for input(s): AST, ALT, ALKPHOS, BILITOT, PROT, ALBUMIN in the last 168 hours. No results for input(s): LIPASE, AMYLASE in the last 168 hours. No results for input(s): AMMONIA in the last 168 hours. Coagulation Profile: No results for input(s): INR, PROTIME in the last 168 hours. Cardiac Enzymes: No results for input(s): CKTOTAL, CKMB, CKMBINDEX, TROPONINI in the last 168 hours. BNP (last 3 results) No results for input(s): PROBNP in the last 8760 hours. HbA1C: No results for input(s): HGBA1C in the last 72 hours. CBG: No results for input(s): GLUCAP in the last 168 hours. Lipid Profile: No results for input(s): CHOL, HDL, LDLCALC, TRIG, CHOLHDL, LDLDIRECT in the last 72 hours. Thyroid Function Tests: No results for input(s): TSH, T4TOTAL, FREET4, T3FREE, THYROIDAB in the last 72 hours. Anemia Panel: No results for input(s): VITAMINB12, FOLATE, FERRITIN, TIBC, IRON, RETICCTPCT in the last 72 hours. Urine analysis:    Component Value Date/Time   COLORURINE YELLOW 12/07/2015 1829   APPEARANCEUR CLEAR 12/07/2015 1829   APPEARANCEUR Cloudy (A) 12/29/2014 1012   LABSPEC 1.006 12/07/2015 1829   PHURINE 6.0 12/07/2015 1829   GLUCOSEU NEGATIVE 12/07/2015 1829   GLUCOSEU NEG mg/dL 26/83/4196 2229   HGBUR NEGATIVE 12/07/2015 1829   BILIRUBINUR NEGATIVE 12/07/2015 1829   BILIRUBINUR Negative 12/29/2014 1012   KETONESUR NEGATIVE 12/07/2015 1829   PROTEINUR NEGATIVE 12/07/2015 1829   UROBILINOGEN 0.2 12/16/2014 1638   UROBILINOGEN 0.2  10/12/2014 1319   NITRITE NEGATIVE 12/07/2015 1829   LEUKOCYTESUR NEGATIVE 12/07/2015 1829   LEUKOCYTESUR Negative 12/29/2014 1012   Sepsis  Labs: !!!!!!!!!!!!!!!!!!!!!!!!!!!!!!!!!!!!!!!!!!!! @LABRCNTIP (procalcitonin:4,lacticidven:4) )No results found for this or any previous visit (from the past 240 hour(s)).   Radiological Exams on Admission: Dg Chest 2 View  Result Date: 03/08/2018 CLINICAL DATA:  Central CP that started about 40 minutes ago, with numbness in L hand, PTA pt took one nitro with some relief, hx of MI. HTN, CHF EXAM: CHEST - 2 VIEW COMPARISON:  02/16/2017 FINDINGS: Cardiac silhouette is mildly enlarged. No mediastinal or hilar masses. No evidence of adenopathy. Clear lungs.  No pleural effusion or pneumothorax. Skeletal structures are intact IMPRESSION: No active cardiopulmonary disease. Electronically Signed   By: Amie Portlandavid  Ormond M.D.   On: 03/08/2018 20:38    EKG: Independently reviewed. nsr no acute changes Old chart reviewed Case discussed with mr morelli PA in ED twice cxr reviewed no edema or infiltrate   Assessment/Plan 68 yo female with multiple risk factors comes in with sscp  Principal Problem:   Chest pain- give ntg x 3 per protocol in the ED.  Pain relieved with ntg via ems.  Give aspirin.  Cardiac echo in the am.  Give chronic pain meds which she has not received this evening.  Give gi cocktail.  Serial trop.  ekg nonischemic, obs overnight on tele bed.   Active Problems:   History of alcohol abuse- not active    Essential hypertension- resume home meds    Chronic kidney disease (CKD), stage III (moderate) (HCC)- at baseline cr 1.4    GERD (gastroesophageal reflux disease)- give gi cocktail    DVT of lower extremity, bilateral (HCC)- cont xaralto    DVT prophylaxis: xaralto Code Status: full Family Communication: none Disposition Plan: tomorrow Consults called: cardiology and neurology by EDP  Admission status: observation   DAVID,RACHAL A  MD [facility]  If 7PM-7AM, please contact night-coverage [email or web address] Password [email or web address]  03/08/2018, 10:38 PM

## 2018-03-08 NOTE — ED Triage Notes (Signed)
Pt come via [facility] for central CP that started about 40 minutes ago, with numbness in L hand, PTA pt took one nitro with some relief, hx of MI, on [place]

## 2018-03-08 NOTE — ED Provider Notes (Signed)
[facility] EMERGENCY DEPARTMENT Provider Note   CSN: [id number] Arrival date & time: 03/08/18  1938     History   Chief Complaint Chief Complaint  Patient presents with  . Chest Pain    HPI Vanessa Palmer is a 68 y.o. female with history of MI, CAD, hypertension, hyperlipidemia, CHF and CKD on Xarelto for previous DVT presenting today for chest pain.  Patient states that her initial episode of chest pain began at 3:00 and was relieved following nitro.  Patient states that her pain recurred approximately 40 minutes prior to arrival, substernal pressure constant moderate intensity somewhat relieved following nitro.  Patient states that this is second episode of chest pain was accompanied with left arm tingling in the median distribution. HPI  Past Medical History:  Diagnosis Date  . ALCOHOL ABUSE 12/13/2005   Annotation: Sober since 11/06 Qualifier: Diagnosis of  By: Wallace Cullens MD, Natalia Leatherwood    . Anemia   . Arthritis    "all over" (02/21/2017)  . CAD (coronary artery disease)    s/p non-Q wave MI in 06/2001 and 2004, 2005  . CHF (congestive heart failure) (HCC)   . Chronic kidney disease (CKD), stage III (moderate) (HCC) 09/19/2010  . Chronic mid back pain   . Depression   . DJD (degenerative joint disease)   . DVT (deep venous thrombosis) (HCC)    BLE  . DVT of lower extremity, bilateral (HCC) 04/04/2012   On Xarelto    . GERD (gastroesophageal reflux disease)   . Gout   . Hyperlipidemia   . Hypertension   . INTRINSIC ASTHMA, WITH EXACERBATION 09/27/2009   Qualifier: Diagnosis of  By: Denton Meek MD, Tillie Rung    . Migraine    "none anymore" (02/21/2017)  . Myocardial infarction [facility]) ?2005  . Obesity   . OSA (obstructive sleep apnea)    previously used CPAP, has lost it (02/21/2017)  . Seizures (HCC)    As a teenager     Patient Active Problem List   Diagnosis Date Noted  . Chest pain 03/08/2018  . Unilateral primary osteoarthritis, left knee 04/29/2017   . Unilateral primary osteoarthritis, right knee 04/29/2017  . New-onset angina (HCC) 02/21/2017  . LVH (left ventricular hypertrophy) 08/15/2016  . Status post total replacement of right hip 11/23/2014  . Osteoarthritis of right hip 10/13/2014  . Nocturnal leg cramps 01/06/2014  . DVT of lower extremity, bilateral (HCC) 04/04/2012  . Routine health maintenance 01/14/2012  . GERD (gastroesophageal reflux disease) 01/09/2012  . Urinary incontinence 12/31/2011  . Chronic kidney disease (CKD), stage III (moderate) (HCC) 09/19/2010  . Chronic pelvic pain in female 02/03/2010  . Asthma, chronic 09/27/2009  . Elevated alkaline phosphatase level 08/01/2009  . Pain in joint, multiple sites 12/21/2008  . MAJOR DPRSV DISORDER RECURRENT EPISODE MODERATE 09/08/2008  . Gout 07/05/2008  . Hyperlipidemia 05/07/2006  . Coronary atherosclerosis 02/05/2006  . Morbid obesity (HCC) 12/13/2005  . History of alcohol abuse 12/13/2005  . SLEEP APNEA, OBSTRUCTIVE 12/13/2005  . Essential hypertension 12/13/2005    Past Surgical History:  Procedure Laterality Date  . ABDOMINAL HYSTERECTOMY     partial; due to endometriosis  . CARDIAC CATHETERIZATION  06/2001, 02/2002, 10/2004    (10-15% proximal stenosis of circumflex, diffuse disease of  OM1 and RCA)  . CARDIAC CATHETERIZATION  02/21/2017  . COLONOSCOPY    . JOINT REPLACEMENT    . KNEE ARTHROSCOPY Left   . LEFT HEART CATH AND CORONARY ANGIOGRAPHY N/A 02/21/2017  Procedure: LEFT HEART CATH AND CORONARY ANGIOGRAPHY;  Surgeon: Rinaldo CloudHarwani, Mohan, MD;  Location: [facility] INVASIVE CV LAB;  Service: Cardiovascular;  Laterality: N/A;  . TOTAL HIP ARTHROPLASTY Right 11/23/2014   Procedure: RIGHT TOTAL HIP ARTHROPLASTY ANTERIOR APPROACH;  Surgeon: Kathryne Hitchhristopher Y Blackman, MD;  Location: [facility] OR;  Service: Orthopedics;  Laterality: Right;  . TUBAL LIGATION       OB History   No obstetric history on file.      Home Medications    Prior to Admission medications    Medication Sig Start Date End Date Taking? Authorizing Provider  acetaminophen (TYLENOL) 500 MG tablet Take 500-1,000 mg by mouth every 6 (six) hours as needed (for pain).   Yes [provider]  amLODipine (NORVASC) 5 MG tablet Take 5 mg by mouth daily.   Yes [provider]  atorvastatin (LIPITOR) 40 MG tablet Take 1 tablet (40 mg total) by mouth daily at 6 PM. Patient taking differently: Take 40 mg by mouth daily.  01/08/18  Yes [name], [name], MD  fluticasone (FLONASE) 50 MCG/ACT nasal spray INSTILL TWO SPRAY IN EACH NOSTRIL DAILY AS NEEDED FOR ALLERGIES Patient taking differently: Place 2 sprays into both nostrils daily as needed for allergies.  12/10/17  Yes [name], [name], MD  fluticasone (FLOVENT HFA) 44 MCG/ACT inhaler Inhale 2 puffs into the lungs 2 (two) times daily. Patient taking differently: Inhale 2 puffs into the lungs 2 (two) times daily as needed ("for flares").  09/02/17  Yes [name], [name], MD  furosemide (LASIX) 40 MG tablet TAKE ONE TABLET BY MOUTH DAILY Patient taking differently: Take 40 mg by mouth daily.  12/24/17  Yes [name], [name], MD  metoprolol succinate (TOPROL-XL) 50 MG 24 hr tablet Take 50 mg by mouth daily. 01/21/17  Yes [provider]  nitroGLYCERIN (NITROSTAT) 0.4 MG SL tablet Place 1 tablet (0.4 mg total) under the tongue every 5 (five) minutes as needed for chest pain. 09/02/17  Yes [name], [name], MD  omeprazole (PRILOSEC) 20 MG capsule Take 1 capsule (20 mg total) by mouth daily. 05/07/17  Yes Doneen PoissonKlima, Lawrence, MD  oxybutynin (DITROPAN) 5 MG tablet TAKE ONE TABLET BY MOUTH TWICE A DAY Patient taking differently: Take 5 mg by mouth 2 (two) times daily.  02/26/18  Yes [name], [name], MD  oxyCODONE-acetaminophen (PERCOCET) 10-325 MG tablet Take 1 tablet by mouth every 8 (eight) hours as needed for pain (chronic arthritis pain, multiple sites, diag code M16.11, M25.50). 02/27/18 03/29/18 Yes Gust RungHoffman, Erik C, DO  sertraline (ZOLOFT) 25 MG  tablet Take 1 tablet (25 mg total) by mouth daily. 01/08/18  Yes [name], [name], MD  traZODone (DESYREL) 50 MG tablet TAKE 1 TO TWO TABLETS (50-100 MG TOTAL) BY MOUTH AT BEDTIME Patient taking differently: Take 50-100 mg by mouth at bedtime as needed for sleep.  10/01/17  Yes [name], [name], MD  XARELTO 20 MG TABS tablet TAKE ONE TABLET BY MOUTH DAILY WITH SUPPER Patient taking differently: Take 20 mg by mouth daily with breakfast.  02/26/18  Yes [name], [name], MD  amLODipine (NORVASC) 10 MG tablet Take 1 tablet (10 mg total) by mouth daily. Patient not taking: Reported on 03/08/2018 08/07/17   [name], [name], MD  furosemide (LASIX) 20 MG tablet Take one half of the tablet once daily for 5 days. Then stop 09/19/10 11/29/19  Denna HaggardKarimova, Nodira J, MD    Family History Family History  Problem Relation Age of Onset  . Mental illness  Mother   . Heart disease Father   . Diabetes Sister   . Diabetes Sister   . Diabetes Sister     Social History Social History   Tobacco Use  . Smoking status: Never Smoker  . Smokeless tobacco: Never Used  Substance Use Topics  . Alcohol use: Not Currently    Alcohol/week: 12.0 standard drinks    Types: 6 Cans of beer, 6 Shots of liquor per week    Comment: x 4 weeks.  . Drug use: No     Allergies   Ace inhibitors; Regadenoson; Shrimp [shellfish allergy]; Peanut oil; Hydrocodone-acetaminophen; Hydromorphone hcl; Hydromorphone hcl; Oxycodone-acetaminophen; Propoxyphene n-acetaminophen; and Vicodin [hydrocodone-acetaminophen]   Review of Systems Review of Systems  Constitutional: Negative.  Negative for chills and fever.  Eyes: Negative.  Negative for visual disturbance.  Respiratory: Negative.  Negative for cough and shortness of breath.   Cardiovascular: Positive for chest pain.  Gastrointestinal: Negative.  Negative for abdominal pain, diarrhea, nausea and vomiting.  Genitourinary: Negative.  Negative for dysuria and hematuria.  Musculoskeletal:  Negative.  Negative for arthralgias and myalgias.  Neurological: Positive for numbness (Tingling left hand). Negative for dizziness, syncope, weakness and headaches.  All other systems reviewed and are negative.  Physical Exam Updated Vital Signs BP (!) 161/83 (BP Location: Left Arm)   Pulse (!) 59   Temp 98.3 F (36.8 C) (Oral)   Resp 19   SpO2 96%   Physical Exam Constitutional:      General: She is not in acute distress.    Appearance: She is well-developed. She is not ill-appearing or diaphoretic.  HENT:     Head: Normocephalic and atraumatic.     Right Ear: External ear normal.     Left Ear: External ear normal.     Nose: Nose normal.  Eyes:     Pupils: Pupils are equal, round, and reactive to light.  Neck:     Musculoskeletal: Normal range of motion.     Trachea: Trachea normal. No tracheal deviation.  Cardiovascular:     Rate and Rhythm: Normal rate and regular rhythm.     Pulses:          Dorsalis pedis pulses are 2+ on the right side and 2+ on the left side.       Posterior tibial pulses are 2+ on the right side and 2+ on the left side.     Heart sounds: Normal heart sounds.  Pulmonary:     Effort: Pulmonary effort is normal. No accessory muscle usage or respiratory distress.     Breath sounds: Normal breath sounds. No decreased breath sounds.  Chest:     Chest wall: No tenderness.  Abdominal:     Palpations: Abdomen is soft.     Tenderness: There is no abdominal tenderness. There is no guarding or rebound.  Musculoskeletal: Normal range of motion.     Right lower leg: She exhibits no tenderness. No edema.     Left lower leg: She exhibits no tenderness. No edema.  Skin:    General: Skin is warm and dry.     Capillary Refill: Capillary refill takes less than 2 seconds.  Neurological:     Mental Status: She is alert and oriented to person, place, and time.     GCS: GCS eye subscore is 4. GCS verbal subscore is 5. GCS motor subscore is 6.     Comments: Mental  Status: Alert, oriented, thought content appropriate, able to give a coherent history.  Speech fluent without evidence of aphasia. Able to follow 2 step commands without difficulty. Cranial Nerves: II: Peripheral visual fields grossly normal, pupils equal, round, reactive to light III,IV, VI: ptosis not present, extra-ocular motions intact bilaterally V,VII: smile symmetric, eyebrows raise symmetric, facial light touch sensation equal VIII: hearing grossly normal to voice X: uvula elevates symmetrically XI: bilateral shoulder shrug symmetric and strong XII: midline tongue extension without fassiculations Motor: Normal tone. 5/5 strength in upper and lower extremities bilaterally including strong and equal grip strength and dorsiflexion/plantar flexion Sensory: Sensation intact to light touch in all extremities.Negative Romberg.  Cerebellar: normal finger-to-nose with bilateral upper extremities slightly tremulous with left hand. Normal heel-to -shin balance bilaterally of the lower extremity. No pronator drift.  CV: distal pulses palpable throughout  Psychiatric:        Behavior: Behavior normal.    ED Treatments / Results  Labs (all labs ordered are listed, but only abnormal results are displayed) Labs Reviewed  BASIC METABOLIC PANEL - Abnormal; Notable for the following components:      Result Value   Glucose, Bld 106 (*)    Creatinine, Ser 1.43 (*)    Calcium 8.1 (*)    GFR calc non Af Amer 38 (*)    GFR calc Af Amer 44 (*)    All other components within normal limits  CBC - Abnormal; Notable for the following components:   Hemoglobin 11.8 (*)    HCT 35.9 (*)    All other components within normal limits  TROPONIN I  TROPONIN I  TROPONIN I  I-STAT TROPONIN, ED    EKG EKG Interpretation  Date/Time:  Saturday March 08 2018 19:47:03 EST Ventricular Rate:  56 PR Interval:    QRS Duration: 113 QT Interval:  451 QTC Calculation: 436 R Axis:   43 Text  Interpretation:  Sinus or ectopic atrial rhythm Borderline intraventricular conduction delay Low voltage, precordial leads When compared to prior, no signifiant changes seen.  No STEMI Confirmed by Theda Belfastegeler, Chris ([id number]) on 03/08/2018 8:05:06 PM   Radiology Dg Chest 2 View  Result Date: 03/08/2018 CLINICAL DATA:  Central CP that started about 40 minutes ago, with numbness in L hand, PTA pt took one nitro with some relief, hx of MI. HTN, CHF EXAM: CHEST - 2 VIEW COMPARISON:  02/16/2017 FINDINGS: Cardiac silhouette is mildly enlarged. No mediastinal or hilar masses. No evidence of adenopathy. Clear lungs.  No pleural effusion or pneumothorax. Skeletal structures are intact IMPRESSION: No active cardiopulmonary disease. Electronically Signed   By: Amie Portlandavid  Ormond M.D.   On: 03/08/2018 20:38    Procedures Procedures (including critical care time)  Medications Ordered in ED Medications  metoprolol succinate (TOPROL-XL) 24 hr tablet 50 mg (has no administration in time range)  furosemide (LASIX) tablet 40 mg (has no administration in time range)  sertraline (ZOLOFT) tablet 25 mg (has no administration in time range)  atorvastatin (LIPITOR) tablet 40 mg (has no administration in time range)  rivaroxaban (XARELTO) tablet 20 mg (has no administration in time range)  amLODipine (NORVASC) tablet 5 mg (has no administration in time range)  acetaminophen (TYLENOL) tablet 650 mg (has no administration in time range)  ondansetron (ZOFRAN) injection 4 mg (has no administration in time range)  aspirin EC tablet 325 mg (has no administration in time range)  nitroGLYCERIN (NITROSTAT) SL tablet 0.4 mg (has no administration in time range)  oxyCODONE-acetaminophen (PERCOCET/ROXICET) 5-325 MG per tablet 1 tablet (1 tablet Oral Given 03/09/18 0011)  And  oxyCODONE (Oxy IR/ROXICODONE) immediate release tablet 5 mg (5 mg Oral Given 03/09/18 0011)  diphenhydrAMINE (BENADRYL) capsule 25 mg (25 mg Oral Given 03/09/18  0011)  aspirin chewable tablet 324 mg (324 mg Oral Given 03/08/18 2153)  nitroGLYCERIN (NITROSTAT) SL tablet 0.4 mg (0.4 mg Sublingual Given 03/08/18 2215)  alum & mag hydroxide-simeth (MAALOX/MYLANTA) 200-200-20 MG/5ML suspension 30 mL (30 mLs Oral Given 03/09/18 0011)     Initial Impression / Assessment and Plan / ED Course  I have reviewed the triage vital signs and the nursing notes.  Pertinent labs & imaging results that were available during my care of the patient were reviewed by me and considered in my medical decision making (see chart for details).    68 year old female with significant cardiac risk factors presenting for substernal chest pain, 2 episodes today.  Most recent episode occurring 40 minutes prior to arrival.  Additionally with left hand tingling of median distribution, no neuro deficit on examination. ---------- Discussed left median nerve distribution tingling with Dr. Amada Jupiter, neurology.  Believes this is likely secondary to patient's chest pain however if tingling does not resolve following cessation of chest pain recommends future MRI brain. ---------- Initial troponin negative BMP with slightly elevated creatinine above baseline CBC nonacute Chest x-ray negative EKG without acute changes reviewed by Dr. Rush Landmark  Case discussed with Dr. Rush Landmark agrees with admission at this time for cardiac rule out.  Consult to hospitalist call. -------------- 9:17 PM: Discussed case with hospitalist who request that patient be completely chest pain-free prior to admission.  She has requested that patient receive aspirin. ---------- Patient updated on care plan and is agreeable. ------ Patient given additional nitro and aspirin.  Patient states that pain has greatly improved.  She states that her left hand tingling has improved.  She is resting comfortably texting using both hands upon reevaluation.  Reconsulted to hospitalist service. -------------- Patient has been  admitted to hospitalist service by Dr. Onalee Hua.  Note: Portions of this report may have been transcribed using voice recognition software. Every effort was made to ensure accuracy; however, inadvertent computerized transcription errors may still be present. Final Clinical Impressions(s) / ED Diagnoses   Final diagnoses:  Chest pain, unspecified type    ED Discharge Orders    None       Elizabeth Palau 03/09/18 [id number]    Tegeler, Canary Brim, MD 03/09/18 0045

## 2018-03-08 NOTE — Progress Notes (Signed)
ED report received at 2252; pt arrived to the unit via stretcher with belongings to the side. Pt alert and verbally responsive; VSS; telemetry applied and verified; pt oriented to the unit and room; fall/safety precaution and prevention education completed. Pt in bed with call light within reach. Reported off to oncoming RN. Dionne Bucy RN   03/08/18 2323  Vitals  Temp 98.3 F (36.8 C)  Temp Source Oral  BP (!) 161/83  BP Location Left Arm  BP Method Automatic  Patient Position (if appropriate) Sitting  Pulse Rate (!) 59  Pulse Rate Source Dinamap  Resp 19  Oxygen Therapy  SpO2 96 %  O2 Device Room [facility]

## 2018-03-08 NOTE — ED Notes (Signed)
Patient transported to X-ray 

## 2018-03-09 ENCOUNTER — Observation Stay (HOSPITAL_COMMUNITY): Payer: Medicare Other

## 2018-03-09 ENCOUNTER — Other Ambulatory Visit: Payer: Self-pay

## 2018-03-09 DIAGNOSIS — I130 Hypertensive heart and chronic kidney disease with heart failure and stage 1 through stage 4 chronic kidney disease, or unspecified chronic kidney disease: Secondary | ICD-10-CM | POA: Diagnosis not present

## 2018-03-09 DIAGNOSIS — I5030 Unspecified diastolic (congestive) heart failure: Secondary | ICD-10-CM | POA: Diagnosis not present

## 2018-03-09 DIAGNOSIS — E785 Hyperlipidemia, unspecified: Secondary | ICD-10-CM

## 2018-03-09 DIAGNOSIS — I2510 Atherosclerotic heart disease of native coronary artery without angina pectoris: Secondary | ICD-10-CM

## 2018-03-09 DIAGNOSIS — F339 Major depressive disorder, recurrent, unspecified: Secondary | ICD-10-CM

## 2018-03-09 DIAGNOSIS — M797 Fibromyalgia: Secondary | ICD-10-CM

## 2018-03-09 DIAGNOSIS — Z91010 Allergy to peanuts: Secondary | ICD-10-CM

## 2018-03-09 DIAGNOSIS — Z885 Allergy status to narcotic agent status: Secondary | ICD-10-CM

## 2018-03-09 DIAGNOSIS — R0789 Other chest pain: Secondary | ICD-10-CM

## 2018-03-09 DIAGNOSIS — N183 Chronic kidney disease, stage 3 (moderate): Secondary | ICD-10-CM | POA: Diagnosis not present

## 2018-03-09 DIAGNOSIS — Z86718 Personal history of other venous thrombosis and embolism: Secondary | ICD-10-CM

## 2018-03-09 DIAGNOSIS — Z888 Allergy status to other drugs, medicaments and biological substances status: Secondary | ICD-10-CM

## 2018-03-09 DIAGNOSIS — R079 Chest pain, unspecified: Secondary | ICD-10-CM | POA: Diagnosis not present

## 2018-03-09 DIAGNOSIS — R072 Precordial pain: Secondary | ICD-10-CM | POA: Diagnosis not present

## 2018-03-09 DIAGNOSIS — Z7901 Long term (current) use of anticoagulants: Secondary | ICD-10-CM

## 2018-03-09 DIAGNOSIS — I10 Essential (primary) hypertension: Secondary | ICD-10-CM | POA: Diagnosis not present

## 2018-03-09 DIAGNOSIS — Z91013 Allergy to seafood: Secondary | ICD-10-CM

## 2018-03-09 DIAGNOSIS — Z79899 Other long term (current) drug therapy: Secondary | ICD-10-CM

## 2018-03-09 LAB — ECHOCARDIOGRAM COMPLETE
Height: 62 in
Weight: 3964.75 oz

## 2018-03-09 LAB — TROPONIN I
Troponin I: <0.03 ng/mL (ref <0.03)
Troponin I: <0.03 ng/mL (ref <0.03)

## 2018-03-09 MED ORDER — SERTRALINE HCL 50 MG PO TABS: 50.0000 mg | ORAL_TABLET | Freq: Every day | ORAL | 1 refills | Status: DC

## 2018-03-09 MED ORDER — SODIUM CHLORIDE 0.9 % IV SOLN
INTRAVENOUS | Status: DC
  Administered 2018-03-09: 10:00:00 via INTRAVENOUS

## 2018-03-09 NOTE — Consult Note (Signed)
Referring Physician:Dr. Helberg/Dr. [name]  Vanessa Palmer is an 68 y.o. female.                       Chief Complaint: Chest pain  HPI: 68 year old female substernal chest pain improved , reproducible on palpation. Her echocardiogram today shows normal LV systolic function with moderate LVH. Her cardiac enzymes are normal x 2. Her cardiac catheterization was normal 1 year ago. Her chest x-ray is normal.  Past Medical History:  Diagnosis Date  . ALCOHOL ABUSE 12/13/2005   Annotation: Sober since 11/06 Qualifier: Diagnosis of  By: Wallace Cullens MD, Natalia Leatherwood    . Anemia   . Arthritis    "all over" (02/21/2017)  . CAD (coronary artery disease)    s/p non-Q wave MI in 06/2001 and 2004, 2005  . CHF (congestive heart failure) (HCC)   . Chronic kidney disease (CKD), stage III (moderate) (HCC) 09/19/2010  . Chronic mid back pain   . Depression   . DJD (degenerative joint disease)   . DVT (deep venous thrombosis) (HCC)    BLE  . DVT of lower extremity, bilateral (HCC) 04/04/2012   On Xarelto    . GERD (gastroesophageal reflux disease)   . Gout   . Hyperlipidemia   . Hypertension   . INTRINSIC ASTHMA, WITH EXACERBATION 09/27/2009   Qualifier: Diagnosis of  By: Denton Meek MD, Tillie Rung    . Migraine    "none anymore" (02/21/2017)  . Myocardial infarction [facility]) ?2005  . Obesity   . OSA (obstructive sleep apnea)    previously used CPAP, has lost it (02/21/2017)  . Seizures (HCC)    As a teenager       Past Surgical History:  Procedure Laterality Date  . ABDOMINAL HYSTERECTOMY     partial; due to endometriosis  . CARDIAC CATHETERIZATION  06/2001, 02/2002, 10/2004    (10-15% proximal stenosis of circumflex, diffuse disease of  OM1 and RCA)  . CARDIAC CATHETERIZATION  02/21/2017  . COLONOSCOPY    . JOINT REPLACEMENT    . KNEE ARTHROSCOPY Left   . LEFT HEART CATH AND CORONARY ANGIOGRAPHY N/A 02/21/2017   Procedure: LEFT HEART CATH AND CORONARY ANGIOGRAPHY;  Surgeon: Rinaldo Cloud, MD;   Location: [facility] INVASIVE CV [facility];  Service: Cardiovascular;  Laterality: N/A;  . TOTAL HIP ARTHROPLASTY Right 11/23/2014   Procedure: RIGHT TOTAL HIP ARTHROPLASTY ANTERIOR APPROACH;  Surgeon: Kathryne Hitch, MD;  Location: [facility] OR;  Service: Orthopedics;  Laterality: Right;  . TUBAL LIGATION      Family History  Problem Relation Age of Onset  . Mental illness Mother   . Heart disease Father   . Diabetes Sister   . Diabetes Sister   . Diabetes Sister    Social History:  reports that she has never smoked. She has never used smokeless tobacco. She reports previous alcohol use of about 12.0 standard drinks of alcohol per week. She reports that she does not use drugs.  Allergies:  Allergies  Allergen Reactions  . Ace Inhibitors Swelling and Other (See Comments)     Angioedema   . Regadenoson Hives    LEXISCAN  . Shrimp [Shellfish Allergy] Anaphylaxis, Shortness Of Breath and Rash    Had to be taken to the ED when she was exposed to this  . Peanut Oil Itching, Rash and Other (See Comments)    Welts, also  . Hydrocodone-Acetaminophen Itching  . Hydromorphone Hcl Rash and Other (See Comments)  Diffuse rash  . Hydromorphone Hcl Rash and Other (See Comments)    Diffuse rash  . Oxycodone-Acetaminophen Itching    Tolerates with Benadryl  . Propoxyphene N-Acetaminophen Itching  . Vicodin [Hydrocodone-Acetaminophen] Itching    Medications Prior to Admission  Medication Sig Dispense Refill  . acetaminophen (TYLENOL) 500 MG tablet Take 500-1,000 mg by mouth every 6 (six) hours as needed (for pain).    Marland Kitchen. amLODipine (NORVASC) 5 MG tablet Take 5 mg by mouth daily.    Marland Kitchen. atorvastatin (LIPITOR) 40 MG tablet Take 1 tablet (40 mg total) by mouth daily at 6 PM. (Patient taking differently: Take 40 mg by mouth daily. ) 90 tablet 3  . fluticasone (FLONASE) 50 MCG/ACT nasal spray INSTILL TWO SPRAY IN EACH NOSTRIL DAILY AS NEEDED FOR ALLERGIES (Patient taking differently: Place 2 sprays into both  nostrils daily as needed for allergies. ) 16 g 2  . fluticasone (FLOVENT HFA) 44 MCG/ACT inhaler Inhale 2 puffs into the lungs 2 (two) times daily. (Patient taking differently: Inhale 2 puffs into the lungs 2 (two) times daily as needed ("for flares"). ) 1 Inhaler 12  . furosemide (LASIX) 40 MG tablet TAKE ONE TABLET BY MOUTH DAILY (Patient taking differently: Take 40 mg by mouth daily. ) 90 tablet 1  . metoprolol succinate (TOPROL-XL) 50 MG 24 hr tablet Take 50 mg by mouth daily.    . nitroGLYCERIN (NITROSTAT) 0.4 MG SL tablet Place 1 tablet (0.4 mg total) under the tongue every 5 (five) minutes as needed for chest pain. 30 tablet 1  . omeprazole (PRILOSEC) 20 MG capsule Take 1 capsule (20 mg total) by mouth daily. 90 capsule 3  . oxybutynin (DITROPAN) 5 MG tablet TAKE ONE TABLET BY MOUTH TWICE A DAY (Patient taking differently: Take 5 mg by mouth 2 (two) times daily. ) 60 tablet 1  . oxyCODONE-acetaminophen (PERCOCET) 10-325 MG tablet Take 1 tablet by mouth every 8 (eight) hours as needed for pain (chronic arthritis pain, multiple sites, diag code M16.11, M25.50). 90 tablet 0  . sertraline (ZOLOFT) 25 MG tablet Take 1 tablet (25 mg total) by mouth daily. 30 tablet 3  . traZODone (DESYREL) 50 MG tablet TAKE 1 TO TWO TABLETS (50-100 MG TOTAL) BY MOUTH AT BEDTIME (Patient taking differently: Take 50-100 mg by mouth at bedtime as needed for sleep. ) 45 tablet 5  . XARELTO 20 MG TABS tablet TAKE ONE TABLET BY MOUTH DAILY WITH SUPPER (Patient taking differently: Take 20 mg by mouth daily with breakfast. ) 30 tablet 1  . amLODipine (NORVASC) 10 MG tablet Take 1 tablet (10 mg total) by mouth daily. (Patient not taking: Reported on 03/08/2018) 90 tablet 3    Results for orders placed or performed during the hospital encounter of 03/08/18 (from the past 48 hour(s))  Basic metabolic panel     Status: Abnormal   Collection Time: 03/08/18  7:47 PM  Result Value Ref Range   Sodium 138 135 - 145 mmol/L    Potassium 3.7 3.5 - 5.1 mmol/L   Chloride 103 98 - 111 mmol/L   CO2 25 22 - 32 mmol/L   Glucose, Bld 106 (H) 70 - 99 mg/dL   BUN 14 8 - 23 mg/dL   Creatinine, Ser 1.611.43 (H) 0.44 - 1.00 mg/dL   Calcium 8.1 (L) 8.9 - 10.3 mg/dL   GFR calc non Af Amer 38 (L) >60 mL/min   GFR calc Af Amer 44 (L) >60 mL/min   Anion gap 10 5 -  15    Comment: Performed at [facility], [address] [place], [place] [address]  CBC     Status: Abnormal   Collection Time: 03/08/18  7:47 PM  Result Value Ref Range   WBC 4.8 4.0 - 10.5 K/uL   RBC 3.87 3.87 - 5.11 MIL/uL   Hemoglobin 11.8 (L) 12.0 - 15.0 g/dL   HCT 29.535.9 (L) 18.836.0 - 41.646.0 %   MCV 92.8 80.0 - 100.0 fL   MCH 30.5 26.0 - 34.0 pg   MCHC 32.9 30.0 - 36.0 g/dL   RDW 60.612.9 30.111.5 - 60.115.5 %   Platelets 185 150 - 400 K/uL   nRBC 0.0 0.0 - 0.2 %    Comment: Performed at [facility], [address] [place], [place] [address]  I-stat troponin, ED     Status: None   Collection Time: 03/08/18  7:51 PM  Result Value Ref Range   Troponin i, poc 0.00 0.00 - 0.08 ng/mL   Comment 3            Comment: Due to the release kinetics of cTnI, a negative result within the first hours of the onset of symptoms does not rule out myocardial infarction with certainty. If myocardial infarction is still suspected, repeat the test at appropriate intervals.   Troponin I - Now Then Q3H     Status: None   Collection Time: 03/08/18 11:06 PM  Result Value Ref Range   Troponin I <0.03 <0.03 ng/mL    Comment: Performed at [facility], [address] [place], [place] [address]  Troponin I - Now Then Q3H     Status: None   Collection Time: 03/09/18  2:13 AM  Result Value Ref Range   Troponin I <0.03 <0.03 ng/mL    Comment: Performed at [facility], [address] [place], [place] [address]  Troponin I - Now Then Q3H     Status: None   Collection Time: 03/09/18  5:28 AM  Result Value Ref Range   Troponin I <0.03 <0.03 ng/mL    Comment: Performed  at [facility], [address] [place], [place] [address]   Dg Chest 2 View  Result Date: 03/08/2018 CLINICAL DATA:  Central CP that started about 40 minutes ago, with numbness in L hand, PTA pt took one nitro with some relief, hx of MI. HTN, CHF EXAM: CHEST - 2 VIEW COMPARISON:  02/16/2017 FINDINGS: Cardiac silhouette is mildly enlarged. No mediastinal or hilar masses. No evidence of adenopathy. Clear lungs.  No pleural effusion or pneumothorax. Skeletal structures are intact IMPRESSION: No active cardiopulmonary disease. Electronically Signed   By: Amie Portlandavid  Ormond M.D.   On: 03/08/2018 20:38    Review Of Systems Constitutional: No fever, chills, weight loss or gain. Eyes: No vision change, wears glasses. No discharge or pain. Ears: No hearing loss, No tinnitus. Respiratory: No asthma, COPD, pneumonias. No shortness of breath. No hemoptysis. Cardiovascular: No chest pain, palpitation, leg edema. Gastrointestinal: No nausea, vomiting, diarrhea, constipation. No GI bleed. No hepatitis. Genitourinary: No dysuria, hematuria, kidney stone. No incontinance. Neurological: No headache, stroke, seizures.  Psychiatry: No psych facility admission for anxiety, depression, suicide. No detox. Skin: No rash. Musculoskeletal: No joint pain, fibromyalgia. No neck pain, back pain. Lymphadenopathy: No lymphadenopathy. Hematology: No anemia or easy bruising.   Blood pressure 113/65, pulse (!) 50, temperature 98.3 F (36.8 C), temperature source Oral, resp. rate 19, height 5\' 2"  (1.575 m),  weight 112.4 kg, SpO2 98 %. Body mass index is 45.32 kg/m. General appearance: alert, cooperative, appears stated age and mild distress Head: Normocephalic, atraumatic. Eyes: Brown eyes, pink conjunctiva, corneas clear. PERRL, EOM's intact. Neck: No adenopathy, no carotid bruit, no JVD, supple, symmetrical, trachea midline and thyroid not enlarged. Resp: Clear to auscultation bilaterally. Chest pain on palpation  over mid and lower sternal areas, bilaterally. Cardio: Regular rate and rhythm, S1, S2 normal, II/VI systolic and diastolic murmur, no click, rub or gallop GI: Soft, non-tender; bowel sounds normal; no organomegaly. Extremities: No edema, cyanosis or clubbing. Tenderness on palpation of shins. Skin: Warm and dry.  Neurologic: Alert and oriented X 3, normal strength. Normal coordination.  Assessment/Plan Chest pain from coronary microvascular disease (as had abnormal nuclear stress test but normal coronary arteries on cardiac cath) Chest pain, musculoskeletal Morbid obesity Fibromyalgia Osteoarthritis H/O DVT Hypertension Hyperlipidemia CKD, III OSA  Medical treatment only, without cardiac interventions. Continue B-blocker, amlodipine, atorvastatin, SL NTG and Xarelto. May increase Zoloft to 50 mg. Daily from 25 mg. F/U Dr. Sharyn Lull  In 1 month.  Ricki Rodriguez, MD  03/09/2018, 12:03 PM

## 2018-03-09 NOTE — Progress Notes (Signed)
Internal Medicine Attending:   I saw and examined the patient. I reviewed the resident's note and I agree with the resident's findings and plan as documented in the resident's note.  As noted by Dr. Caron Presume patient was admitted for work-up of chest pain.  Troponins have been negative x3 EKGs are non ischemic, does have resolved.  Cardiology was consulted Dr. Algie Coffer saw her this morning she has previously had a clean coronary cath she is on appropriate antianginal medications.  She does have history of fibromyalgia and depression I agree with Dr. Algie Coffer of increasing her Zoloft to 50 mg daily.  Otherwise she may be discharged home today we will be able to follow her up in our clinic.

## 2018-03-09 NOTE — Progress Notes (Signed)
Assumed care from [place], [place]. Pt had just been admitted into her room. Stable at this time.

## 2018-03-09 NOTE — Discharge Summary (Signed)
Name: Vanessa Palmer MRN: [id number] DOB: 01-Jun-1950 68 y.o. PCP: Inez Catalina, MD  Date of Admission: 03/08/2018  7:38 PM Date of Discharge: 03/09/2018 Attending Physician: Gust Rung, DO  Discharge Diagnosis: 1. Chest Pain  Discharge Medications: Allergies as of 03/09/2018      Reactions   Ace Inhibitors Swelling, Other (See Comments)    Angioedema   Regadenoson Hives   LEXISCAN   Shrimp [shellfish Allergy] Anaphylaxis, Shortness Of Breath, Rash   Had to be taken to the ED when she was exposed to this   Peanut Oil Itching, Rash, Other (See Comments)   Welts, also   Hydrocodone-acetaminophen Itching   Hydromorphone Hcl Rash, Other (See Comments)   Diffuse rash   Hydromorphone Hcl Rash, Other (See Comments)   Diffuse rash   Oxycodone-acetaminophen Itching   Tolerates with Benadryl   Propoxyphene N-acetaminophen Itching   Vicodin [hydrocodone-acetaminophen] Itching      Medication List    TAKE these medications   acetaminophen 500 MG tablet Commonly known as:  TYLENOL Take 500-1,000 mg by mouth every 6 (six) hours as needed (for pain).   amLODipine 10 MG tablet Commonly known as:  NORVASC Take 1 tablet (10 mg total) by mouth daily. What changed:  Another medication with the same name was removed. Continue taking this medication, and follow the directions you see here.   atorvastatin 40 MG tablet Commonly known as:  LIPITOR Take 1 tablet (40 mg total) by mouth daily at 6 PM. What changed:  when to take this   fluticasone 44 MCG/ACT inhaler Commonly known as:  FLOVENT HFA Inhale 2 puffs into the lungs 2 (two) times daily. What changed:    when to take this  reasons to take this   fluticasone 50 MCG/ACT nasal spray Commonly known as:  FLONASE INSTILL TWO SPRAY IN EACH NOSTRIL DAILY AS NEEDED FOR ALLERGIES What changed:    how much to take  how to take this  when to take this  reasons to take this  additional instructions   metoprolol  succinate 50 MG 24 hr tablet Commonly known as:  TOPROL-XL Take 50 mg by mouth daily.   nitroGLYCERIN 0.4 MG SL tablet Commonly known as:  NITROSTAT Place 1 tablet (0.4 mg total) under the tongue every 5 (five) minutes as needed for chest pain.   omeprazole 20 MG capsule Commonly known as:  PRILOSEC Take 1 capsule (20 mg total) by mouth daily.   oxybutynin 5 MG tablet Commonly known as:  DITROPAN TAKE ONE TABLET BY MOUTH TWICE A DAY   oxyCODONE-acetaminophen 10-325 MG tablet Commonly known as:  PERCOCET Take 1 tablet by mouth every 8 (eight) hours as needed for pain (chronic arthritis pain, multiple sites, diag code M16.11, M25.50).   sertraline 50 MG tablet Commonly known as:  ZOLOFT Take 1 tablet (50 mg total) by mouth daily. What changed:    medication strength  how much to take   traZODone 50 MG tablet Commonly known as:  DESYREL TAKE 1 TO TWO TABLETS (50-100 MG TOTAL) BY MOUTH AT BEDTIME What changed:  See the new instructions.   XARELTO 20 MG Tabs tablet Generic drug:  rivaroxaban TAKE ONE TABLET BY MOUTH DAILY WITH SUPPER What changed:  See the new instructions.     ASK your doctor about these medications   furosemide 40 MG tablet Commonly known as:  LASIX TAKE ONE TABLET BY MOUTH DAILY Ask about: Which instructions should I use?  Disposition and follow-up:   [name] was discharged from [facility] in Stable condition.  At the hospital follow up visit please address:  1.  Chest Pain. Patient will need to follow up with cardiology in approximately one month. Patient Zoloft was increased to 50 mg once daily. Please ensure that she is tolerating this without additional side effects.  2.  Labs / imaging needed at time of follow-up: None  3.  Pending labs/ test needing follow-up: None  Follow-up Appointments: Follow-up Information    [name], Vanessa B, MD Follow up.   Specialty:  Internal Medicine Why:  They will call you to  schedule an appointment. Contact information: [address] [place] [address] [phone]         Hospital Course by problem list:  Chest pain. Vanessa Palmer Appling is a 68 y.o female with CAD, HTN, HLD, HFpEF< and CKD who presented to the ED on 1/11 with substernal chest pain radiating to her left arm. In the EMS she received a dose of sublingual nitro which helped to relieve the pain. She was subsequently admitted for ACS rule out. Troponin's remains negative times three and EKG was stable compared to prior without any new T-wave conversion, ST segment changes, or new conduction abnormalities. Echocardiogram was performed that illustrated normal left ventricular ejection fraction, more results as detailed below. Cardiology was consulted who recommended continued medical management with follow-up in approximately one month. Patient's Zoloft was increased to 50 mg once daily per cardiology recommendations.  Discharge Vitals:   BP 109/62 (BP Location: Right Arm)   Pulse (!) 58   Temp 98 F (36.7 C) (Oral)   Resp 14   Ht 5\' 2"  (1.575 m)   Wt 112.4 kg   SpO2 97%   BMI 45.32 kg/m   Pertinent Labs, Studies, and Procedures:   Echocardiogram  Study Conclusions  - Left ventricle: The cavity size was normal. There was moderate   concentric hypertrophy. Systolic function was vigorous. The   estimated ejection fraction was in the range of 65% to 70%. Wall   motion was normal; there were no regional wall motion   abnormalities. Doppler parameters are consistent with abnormal   left ventricular relaxation (grade 1 diastolic dysfunction). - Aortic valve: There was trivial regurgitation. - Mitral valve: Calcified annulus. - Left atrium: The atrium was mildly dilated. - Pulmonary arteries: Systolic pressure was mildly increased. PA   peak pressure: 38 mm Hg (S). - Pericardium, extracardiac: A trivial pericardial effusion was   identified.  Discharge Instructions: Discharge Instructions     Diet - low sodium heart healthy   Complete by:  As directed    Increase activity slowly   Complete by:  As directed     Signed: Levora DredgeHelberg, Tattiana Fakhouri, MD 03/09/2018, 1:51 PM

## 2018-03-09 NOTE — Progress Notes (Signed)
Discharge instructions reviewed with patient and family (daughter and sisters).  The following instructions were discussed:  Follow-up appointments, exit care education, when to call the MD, new medications, new dosages for old medications, EMS services alert system, etc.  Patient discharged to home via private vehicle accompanied by family members.  Escorted to exit via wheelchair by nurse tech.

## 2018-03-09 NOTE — Progress Notes (Signed)
  Echocardiogram 2D Echocardiogram has been performed.  Vanessa Palmer 03/09/2018, 9:23 AM

## 2018-03-09 NOTE — Progress Notes (Signed)
   Transfer Summary: Leverne Malzone is a 68 y.o female with CAD, HTN, HLD, HFpEF< and CKD who presented to the ED on 1/11 with substernal chest pain radiating to her left arm. In the EMS she received a dose of sublingual nitro which helped to relieve the pain. She was subsequently admitted for ACS rule out.   Subjective: Patient doing well this morning but states she continues to have substernal chest pressure. The pain is no longer radiating to the left arm. She tells me that at baseline her physical activity is limited by shortness of breath but not chest pressure. She was able to tolerate breakfast this morning. She denies any recent trauma to her chest. She denies recent cough. She does note that the pain is slightly worse with deep inspiration. She has been consistently taking her Xarelto tells me that she does not miss any doses. We discussed that her cardiac enzymes have remained negative and we are awaiting the results of her echocardiogram and cardiology consult. It may be possible that she could be discharged home today depending on cardiology recommendations.  Objective: Vital signs in last 24 hours: Vitals:   03/08/18 2115 03/08/18 2200 03/08/18 2323 03/09/18 0426  BP: 121/70  (!) 161/83 113/65  Pulse: (!) 58 (!) 56 (!) 59 (!) 50  Resp: 17 15 19 19   Temp:   98.3 F (36.8 C) 98.3 F (36.8 C)  TempSrc:   Oral Oral  SpO2: 97% 100% 96% 98%  Weight:    112.4 kg  Height:    5\' 2"  (1.575 m)   General: Obese female in no acute distress Pulm: Good air movement with no wheezing or crackles  CV: RRR, no murmurs, no rubs, tenderness to palpation of the sternum Abdomen: Active bowel sounds, soft, non-distended, no tenderness to palpation  Assessment/Plan:  Tahnya Jaggard is a 68 y.o female with CAD, HTN, HLD, HFpEF< and CKD who presented to the ED on 1/11 with substernal chest pain radiating to her left arm. In the EMS she received a dose of sublingual nitro which helped to relieve the pain.  She was subsequently admitted for ACS rule out.   CAD Chest Pain - Chest pain has improved but has not resolved  - Troponin negative x 3  - EKG stable compared to prior without any new TWI, ST elevation, or conduction abnormalities - Per chart review the patient has a catheterization in 01/2017 that did not illustrate any clinical significant CAD - Continue ASA and metoprolol  - Repeat echocardiogram pending  - ACS has been ruled out but cardiology has been consulted any we will await their recommendations.   Hypertension  - Continue amlodipine, metoprolol, and furosemide   HFpEF - Continue furosemide  - Repeat echo pending   HLD - Continue atorvastatin   CKD Stage III - Renal function stable  - Continue to monitor   Hx of VTE - Continue Xarelto   Dispo: Anticipated discharge in approximately 0-1 day(s) pending cardiology evaluation.  Levora Dredge, MD 03/09/2018, 10:29 AM

## 2018-03-09 NOTE — Discharge Instructions (Signed)
Information on my medicine - XARELTO (Rivaroxaban)  This medication education was reviewed with me or my healthcare representative as part of my discharge preparation.    Why was Xarelto prescribed for you? Xarelto was prescribed for you to reduce the risk of a blood clot forming due to history of blood clots (DVT).  What do you need to know about xarelto ? Take your Xarelto ONCE DAILY at the same time every day with your evening meal. If you have difficulty swallowing the tablet whole, you may crush it and mix in applesauce just prior to taking your dose.  Take Xarelto exactly as prescribed by your doctor and DO NOT stop taking Xarelto without talking to the doctor who prescribed the medication.  Stopping without other stroke prevention medication to take the place of Xarelto may increase your risk of developing a clot that causes a stroke.  Refill your prescription before you run out.  After discharge, you should have regular check-up appointments with your healthcare provider that is prescribing your Xarelto.  In the future your dose may need to be changed if your kidney function or weight changes by a significant amount.  What do you do if you miss a dose? If you are taking Xarelto ONCE DAILY and you miss a dose, take it as soon as you remember on the same day then continue your regularly scheduled once daily regimen the next day. Do not take two doses of Xarelto at the same time or on the same day.   Important Safety Information A possible side effect of Xarelto is bleeding. You should call your healthcare provider right away if you experience any of the following: ? Bleeding from an injury or your nose that does not stop. ? Unusual colored urine (red or dark brown) or unusual colored stools (red or black). ? Unusual bruising for unknown reasons. ? A serious fall or if you hit your head (even if there is no bleeding).  Some medicines may interact with Xarelto and might  increase your risk of bleeding while on Xarelto. To help avoid this, consult your healthcare provider or pharmacist prior to using any new prescription or non-prescription medications, including herbals, vitamins, non-steroidal anti-inflammatory drugs (NSAIDs) and supplements.  This website has more information on Xarelto: VisitDestination.com.br.

## 2018-03-09 NOTE — Progress Notes (Signed)
Patient states is nauseated and having 8/10 chest discomfort.  Having multiple other complaints as well.  MD in room.  Ate 80% of breakfast.

## 2018-03-11 ENCOUNTER — Other Ambulatory Visit: Payer: Self-pay

## 2018-03-11 NOTE — Patient Outreach (Signed)
[facility]) Care Management  03/11/2018  Vanessa Palmer 04-16-1950 [id number]    Unsuccessful call to the patient for follow up to initial assessment. No answer HIPAA compliant voicemail left with contact information.  Plan: RN Health Coach will make outreach attempt the patient within thirty business days.  Juanell Fairlyraci Princetta Uplinger RN, BSN, [facility] RN Health Coach Disease Management [facility] Direct Dial:  [phone]  Fax: [phone]

## 2018-03-17 ENCOUNTER — Other Ambulatory Visit: Payer: Self-pay | Admitting: Internal Medicine

## 2018-03-17 DIAGNOSIS — K219 Gastro-esophageal reflux disease without esophagitis: Secondary | ICD-10-CM

## 2018-03-18 ENCOUNTER — Other Ambulatory Visit: Payer: Self-pay

## 2018-03-18 NOTE — Patient Outreach (Signed)
[facility]) Care Management  03/18/2018  Vanessa Palmer 10-07-1950 [id number]   2nd outreach attempt to the patient for fpllow up assessment. No answer.  HIPAA compliant voicemail left with contact information.  Plan: RN Health Coach will send letter. If no response to calls and letter in ten business days RN Health Coach will proceed with case closure.   Juanell Fairly RN, BSN, [facility] RN Health Coach Disease Management [facility] Dial:  [phone]  Fax: [phone]

## 2018-03-19 ENCOUNTER — Encounter: Payer: Self-pay | Admitting: Internal Medicine

## 2018-03-19 ENCOUNTER — Ambulatory Visit (INDEPENDENT_AMBULATORY_CARE_PROVIDER_SITE_OTHER): Payer: Medicare Other | Admitting: Internal Medicine

## 2018-03-19 ENCOUNTER — Other Ambulatory Visit: Payer: Self-pay

## 2018-03-19 DIAGNOSIS — I10 Essential (primary) hypertension: Secondary | ICD-10-CM

## 2018-03-19 DIAGNOSIS — Z86718 Personal history of other venous thrombosis and embolism: Secondary | ICD-10-CM | POA: Diagnosis not present

## 2018-03-19 DIAGNOSIS — I82403 Acute embolism and thrombosis of unspecified deep veins of lower extremity, bilateral: Secondary | ICD-10-CM

## 2018-03-19 DIAGNOSIS — Z79899 Other long term (current) drug therapy: Secondary | ICD-10-CM

## 2018-03-19 DIAGNOSIS — Z7901 Long term (current) use of anticoagulants: Secondary | ICD-10-CM | POA: Diagnosis not present

## 2018-03-19 DIAGNOSIS — F331 Major depressive disorder, recurrent, moderate: Secondary | ICD-10-CM

## 2018-03-19 DIAGNOSIS — Z91013 Allergy to seafood: Secondary | ICD-10-CM | POA: Insufficient documentation

## 2018-03-19 MED ORDER — EPINEPHRINE 0.3 MG/0.3ML IJ SOAJ: 0.3000 mg | INTRAMUSCULAR | 3 refills | Status: DC | PRN

## 2018-03-19 NOTE — Patient Instructions (Signed)
Ms. Suman - -  You are doing very well!  Please keep taking all of your medications as prescribed.   Come back to see me in 4 months, sooner if you need to.   Thank you!

## 2018-03-19 NOTE — Assessment & Plan Note (Signed)
BP today was initially elevated and then improved.  She is on triple therapy with amlodipine, metoprolol and lasix.  She reports no side effects, no headache or dizziness.  She has had a recent admission for chest pain, felt to be atypical in nature.  Reviewed her d/c summary.   Plan Continue triple therapy - amlodipine, metoprolol, lasix.

## 2018-03-19 NOTE — Progress Notes (Signed)
   Subjective:    Patient ID: Vanessa Palmer, female    DOB: 1950/04/10, 68 y.o.   MRN: [id number]  CC; 4 week follow up for depression, HTN  HPI   Ms. Cobler is a 68 year old woman with PMH of CAD, DVT on xarelto, HTN, OA who was recently admitted for atypical chest pain.  She presents today for follow up of her depression.  Her PHQ-9 at last check was 12 and is 10.  She had her sertraline increased to 50mg  during her hospitalization earlier this month. She feels that her mood is stable.  Most troublesome issue is getting to sleep.  I attempted to trouble shoot some ideas with her, but she kept saying "I don't know" to my questions.  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    Her BP was mildly elevated today at 146/89.  On recheck it was 127/84.  She is taking her amlodipine, metoprolol and lasix.  She brought in her medications today.   Requesting an epi pen.  She has severe allergy to peanuts and shellfish.  Review of Systems  Constitutional: Negative for activity change and appetite change.  Respiratory: Negative for cough and shortness of breath.   Cardiovascular: Negative for chest pain and leg swelling.  Psychiatric/Behavioral: Positive for sleep disturbance. Negative for confusion, decreased concentration and dysphoric mood.       Objective:   Physical Exam Constitutional:      General: She is not in acute distress.    Appearance: Normal appearance. She is not toxic-appearing.  HENT:     Head: Normocephalic and atraumatic.  Cardiovascular:     Rate and Rhythm: Normal rate and regular rhythm.     Heart sounds: No murmur. No friction rub.  Pulmonary:     Effort: Pulmonary effort is normal. No respiratory distress.  Neurological:     Mental Status: She is alert.  Psychiatric:        Thought Content: Thought content normal.        Judgment: Judgment normal.    Reviewed blood work from  hospitalization.       Assessment & Plan:  Return to clinic in 4 months, sooner if needed

## 2018-03-19 NOTE — Assessment & Plan Note (Signed)
She reports that just being near shrimp caused her to break out, she took a benadryl and it got better.   Refill epi pen for her today.

## 2018-03-19 NOTE — Assessment & Plan Note (Signed)
She is taking xarelto without issue.  Denies any increased swelling or pain in the legs.   Plan Continue Xarelto dosing.  Monitor for bleeding issues.

## 2018-03-19 NOTE — Assessment & Plan Note (Signed)
Her PHQ-9 is improved.  She notes improved mood since starting the higher dose of sertraline.  She would like to remain on this dose at this time and be re-evaluated at next visit.  She also notes that sleep is her biggest issue, and we will continue to address this at next visit.   Plan Continue sertraline 50mg  Follow up in 4 months, sooner if needed.

## 2018-03-24 ENCOUNTER — Other Ambulatory Visit: Payer: Self-pay

## 2018-03-24 NOTE — Patient Outreach (Signed)
[facility]) Care Management  03/24/2018   Vanessa Palmer [birth year] [id number]  Subjective: Successful outreach to the patient for assessment.  HIPAA verified.  The patient states that she received her information that I had mailed to her. The patient states that she went to the ER on 03/08/18 for chest pain.  She states that she was kept overnight for observation and went home the next day.  She states that she does not check her blood pressure daily, she will check it every  once in a while.  Discussed with the patient about the importance of checking her blood pressure daily. She verbalized understanding. She had an appointment with Dr Criselda PeachesMullen on 1/22 and her blood pressure was good. 127/84 and her weight 249 lbs.  She states that she has been having pain in her left arm that she rates at a 5/10. She had a fall going to her bathroom.  Her knee gave out. She did not have any injury.    She states that she is doing better with her mood.  She has started knitting.  She states that she has an appointment with Dr Sharyn LullHarwani on 04/02/18.  Current Medications:  Current Outpatient Medications  Medication Sig Dispense Refill  . acetaminophen (TYLENOL) 500 MG tablet Take 500-1,000 mg by mouth every 6 (six) hours as needed (for pain).    Marland Kitchen. amLODipine (NORVASC) 10 MG tablet Take 1 tablet (10 mg total) by mouth daily. 90 tablet 3  . atorvastatin (LIPITOR) 40 MG tablet Take 1 tablet (40 mg total) by mouth daily at 6 PM. (Patient taking differently: Take 40 mg by mouth daily. ) 90 tablet 3  . EPINEPHrine 0.3 mg/0.3 mL IJ SOAJ injection Inject 0.3 mLs (0.3 mg total) into the muscle as needed for anaphylaxis. 1 Device 3  . fluticasone (FLOVENT HFA) 44 MCG/ACT inhaler Inhale 2 puffs into the lungs 2 (two) times daily. (Patient taking differently: Inhale 2 puffs into the lungs 2 (two) times daily as needed ("for flares"). ) 1 Inhaler 12  . furosemide (LASIX) 40 MG tablet TAKE ONE TABLET BY MOUTH DAILY  (Patient taking differently: Take 40 mg by mouth daily. ) 90 tablet 1  . metoprolol succinate (TOPROL-XL) 50 MG 24 hr tablet Take 50 mg by mouth daily.    . nitroGLYCERIN (NITROSTAT) 0.4 MG SL tablet Place 1 tablet (0.4 mg total) under the tongue every 5 (five) minutes as needed for chest pain. 30 tablet 1  . omeprazole (PRILOSEC) 20 MG capsule TAKE 1 CAPSULE (20 MG TOTAL) BY MOUTH DAILY. 90 capsule 3  . oxybutynin (DITROPAN) 5 MG tablet TAKE ONE TABLET BY MOUTH TWICE A DAY (Patient taking differently: Take 5 mg by mouth 2 (two) times daily. ) 60 tablet 1  . oxyCODONE-acetaminophen (PERCOCET) 10-325 MG tablet Take 1 tablet by mouth every 8 (eight) hours as needed for pain (chronic arthritis pain, multiple sites, diag code M16.11, M25.50). 90 tablet 0  . sertraline (ZOLOFT) 50 MG tablet Take 1 tablet (50 mg total) by mouth daily. 90 tablet 1  . traZODone (DESYREL) 50 MG tablet Take 1-2 tablets (50-100 mg total) by mouth at bedtime as needed for sleep. 45 tablet 3  . XARELTO 20 MG TABS tablet TAKE ONE TABLET BY MOUTH DAILY WITH SUPPER (Patient taking differently: Take 20 mg by mouth daily with breakfast. ) 30 tablet 1  . fluticasone (FLONASE) 50 MCG/ACT nasal spray INSTILL TWO SPRAY IN EACH NOSTRIL DAILY AS NEEDED FOR ALLERGIES (Patient not  taking: Reported on 03/24/2018) 16 g 2  . [DISCONTINUED] furosemide (LASIX) 20 MG tablet Take one half of the tablet once daily for 5 days. Then stop 10 tablet 0   No current facility-administered medications for this visit.     Functional Status:  In your present state of health, do you have any difficulty performing the following activities: 03/19/2018 03/09/2018  Hearing? N N  Vision? N Y  Difficulty concentrating or making decisions? N N  Walking or climbing stairs? Y Y  Dressing or bathing? Y N  Doing errands, shopping? N N  Comment - Chief Operating Officer and eating ? - -  Using the Toilet? - -  In the past six months, have you accidently leaked urine? - -   Do you have problems with loss of bowel control? - -  Managing your Medications? - -  Managing your Finances? - -  Housekeeping or managing your Housekeeping? - -  Some recent data might be hidden    Fall/Depression Screening: Fall Risk  03/24/2018 03/19/2018 03/06/2018  Falls in the past year? 1 1 1   Number falls in past yr: 0 1 1  Injury with Fall? 0 0 0  Risk Factor Category  - - -  Comment - - -  Risk for fall due to : - Impaired balance/gait Impaired balance/gait  Risk for fall due to: Comment - - -  Follow up - Falls prevention discussed Falls evaluation completed;Education provided   PHQ 2/9 Scores 03/19/2018 03/06/2018 01/08/2018 12/04/2017 10/17/2017 08/07/2017 05/01/2017  PHQ - 2 Score 2 3 5 5 3  0 0  PHQ- 9 Score 10 12 18 17 17  - -    Assessment: Patient will continue to benefit from health coach outreach for disease management and support.  [facility] CM Care Plan Problem One     Most Recent Value  [facility] Long Term Goal   Member  wil report some weight loss of 1-2 pounds and checking blood pressures daily in 30 days  [facility] Long Term Goal Start Date  03/24/18  Interventions for Problem One Long Term Goal  Reveiwed with the patient to decrease salt in her food intake, dicussed importance of checking her blood pressure at least once a day and medication management.       Plan: RN Health Coach will contact patient in the month of March and patient agrees to next outreach.   Juanell Fairly RN, BSN, [facility] RN Health Coach Disease Management [facility] Dial:  [phone]  Fax: [phone]

## 2018-03-25 ENCOUNTER — Other Ambulatory Visit: Payer: Self-pay

## 2018-03-25 DIAGNOSIS — M2550 Pain in unspecified joint: Secondary | ICD-10-CM

## 2018-03-25 NOTE — Telephone Encounter (Signed)
oxyCODONE-acetaminophen (PERCOCET) 10-325 MG tablet, REFILL REQUEST @  [id number] - [place], [place] - [address] AT [address] OF [address] [phone] (Phone) [phone] (Fax)    

## 2018-03-26 MED ORDER — OXYCODONE-ACETAMINOPHEN 10-325 MG PO TABS: 1.0000 | ORAL_TABLET | Freq: Three times a day (TID) | ORAL | 0 refills | Status: DC | PRN

## 2018-04-02 DIAGNOSIS — I10 Essential (primary) hypertension: Secondary | ICD-10-CM | POA: Diagnosis not present

## 2018-04-02 DIAGNOSIS — E785 Hyperlipidemia, unspecified: Secondary | ICD-10-CM | POA: Diagnosis not present

## 2018-04-02 DIAGNOSIS — I252 Old myocardial infarction: Secondary | ICD-10-CM | POA: Diagnosis not present

## 2018-04-02 DIAGNOSIS — I25118 Atherosclerotic heart disease of native coronary artery with other forms of angina pectoris: Secondary | ICD-10-CM | POA: Diagnosis not present

## 2018-04-02 DIAGNOSIS — N189 Chronic kidney disease, unspecified: Secondary | ICD-10-CM | POA: Diagnosis not present

## 2018-04-09 DIAGNOSIS — M1990 Unspecified osteoarthritis, unspecified site: Secondary | ICD-10-CM | POA: Diagnosis not present

## 2018-04-09 DIAGNOSIS — J45901 Unspecified asthma with (acute) exacerbation: Secondary | ICD-10-CM | POA: Diagnosis not present

## 2018-04-09 DIAGNOSIS — R609 Edema, unspecified: Secondary | ICD-10-CM | POA: Diagnosis not present

## 2018-04-15 ENCOUNTER — Other Ambulatory Visit: Payer: Self-pay | Admitting: Internal Medicine

## 2018-04-15 ENCOUNTER — Ambulatory Visit (INDEPENDENT_AMBULATORY_CARE_PROVIDER_SITE_OTHER): Payer: Medicare Other | Admitting: Internal Medicine

## 2018-04-15 ENCOUNTER — Ambulatory Visit: Payer: Self-pay

## 2018-04-15 ENCOUNTER — Telehealth: Payer: Self-pay | Admitting: *Deleted

## 2018-04-15 DIAGNOSIS — R2232 Localized swelling, mass and lump, left upper limb: Secondary | ICD-10-CM | POA: Diagnosis not present

## 2018-04-15 DIAGNOSIS — I82403 Acute embolism and thrombosis of unspecified deep veins of lower extremity, bilateral: Secondary | ICD-10-CM

## 2018-04-15 DIAGNOSIS — Z1231 Encounter for screening mammogram for malignant neoplasm of breast: Secondary | ICD-10-CM

## 2018-04-15 DIAGNOSIS — Z0000 Encounter for general adult medical examination without abnormal findings: Secondary | ICD-10-CM

## 2018-04-15 NOTE — Assessment & Plan Note (Signed)
Assessment/plan: Vanessa Palmer has a 1 cm round, painful, and mobile nodule in her left mid axilla.  Her symptoms are most consistent with a active lymph node.  We discussed further management including observation versus biopsy.  Vanessa Palmer is amenable to starvation with 1 month follow-up to determine whether the nodule has decreased in size.  She is due for a screening mammogram which was ordered in September 2019.  I have given her the number to make this appointment.

## 2018-04-15 NOTE — Assessment & Plan Note (Addendum)
Assessment/plan: She is due for mammogram (last 1 normal in 2018).  Her PCP ordered one last year but she was unable to get this done.  I gave her the phone number to make this appointment.

## 2018-04-15 NOTE — Telephone Encounter (Addendum)
Call to [facility] scheduled patient for Mammogram Screening for 05/07/2018 3:20 PM.  Patient unable to keep this appointment.  Call to patient's voicemail per her [facility] for [facility] was placed on patient's voice-mail so that patient can reschedule the appointment.  Angelina Ok, RN 04/15/2018 11:17 AM.

## 2018-04-15 NOTE — Progress Notes (Signed)
   CC: left axillary nodule  HPI:  [name] is a 68 y.o. female presents with a left axillary nodule.  She states that she noticed a bump under her left axilla 1 month ago.  She believes that it has progressively increased in size and became painful over the last several weeks.  She denies any other breast masses or systemic symptoms. She denies any scratches or bites to the left arm.  She has not noticed any wounds to this arm. She had a screening mammogram in 2018 which was unremarkable.    Past Medical History:  Diagnosis Date  . ALCOHOL ABUSE 12/13/2005   Annotation: Sober since 11/06 Qualifier: Diagnosis of  By: Wallace Cullens MD, Natalia Leatherwood    . Anemia   . Arthritis    "all over" (02/21/2017)  . CAD (coronary artery disease)    s/p non-Q wave MI in 06/2001 and 2004, 2005  . CHF (congestive heart failure) (HCC)   . Chronic kidney disease (CKD), stage III (moderate) (HCC) 09/19/2010  . Chronic mid back pain   . Depression   . DJD (degenerative joint disease)   . DVT (deep venous thrombosis) (HCC)    BLE  . DVT of lower extremity, bilateral (HCC) 04/04/2012   On Xarelto    . GERD (gastroesophageal reflux disease)   . Gout   . Hyperlipidemia   . Hypertension   . INTRINSIC ASTHMA, WITH EXACERBATION 09/27/2009   Qualifier: Diagnosis of  By: Denton Meek MD, Tillie Rung    . Migraine    "none anymore" (02/21/2017)  . Myocardial infarction [facility]) ?2005  . Obesity   . OSA (obstructive sleep apnea)    previously used CPAP, has lost it (02/21/2017)  . Seizures (HCC)    As a teenager    Review of Systems:   Review of Systems  Constitutional: Negative for chills and fever.  HENT: Negative for congestion and sore throat.   Cardiovascular: Negative for chest pain, palpitations and leg swelling.  All other systems reviewed and are negative.   Physical Exam:  There were no vitals filed for this visit. Physical Exam Vitals signs reviewed.  Constitutional:      Appearance: Normal  appearance.  Skin:    Comments: 1 cm round, mobile, nontender nodule at the left mid axilla.  Neurological:     Mental Status: She is alert.  Psychiatric:        Mood and Affect: Mood normal.        Behavior: Behavior normal.     Assessment & Plan:   See Encounters Tab for problem based charting.  Patient seen with Dr. Josem Kaufmann

## 2018-04-15 NOTE — Patient Instructions (Signed)
Please call to make an appointment for a mammogram. We will see you in 1 month to re-measure the lump.

## 2018-04-16 NOTE — Progress Notes (Signed)
I saw and evaluated the patient. I personally confirmed the key portions of Dr. Alric Ran history and exam and reviewed pertinent patient test results. The assessment, diagnosis, and plan were formulated together and I agree with the documentation in the resident's note.  I agree with Dr. Alric Ran examination findings and confirmed she has a rubbery, mobile, tender, 1 cm mid-axillary nodule that is consistent with a reactive lymph node.  We discussed options and mutually decided the best approach was to observe for 1 month.  If shrinking, no further evaluation is necessary, if enlarging and excisional biopsy may be necessary.  I also agree with getting the screening mammography which is due.

## 2018-04-25 ENCOUNTER — Other Ambulatory Visit: Payer: Self-pay | Admitting: Internal Medicine

## 2018-04-25 DIAGNOSIS — M2550 Pain in unspecified joint: Secondary | ICD-10-CM

## 2018-04-25 MED ORDER — OXYCODONE-ACETAMINOPHEN 10-325 MG PO TABS: 1.0000 | ORAL_TABLET | Freq: Three times a day (TID) | ORAL | 0 refills | Status: DC | PRN

## 2018-04-25 NOTE — Telephone Encounter (Signed)
Needs refill on oxyCODONE-acetaminophen (PERCOCET) 10-325 MG tablet [id number] - [place], [place] - [address] AT [facility] OF [address]  ; pt contact [phone]

## 2018-05-07 ENCOUNTER — Ambulatory Visit
Admission: RE | Admit: 2018-05-07 | Discharge: 2018-05-07 | Disposition: A | Discharge status: Home / Self Care | Payer: Medicare Other | Source: Ambulatory Visit | Attending: Internal Medicine | Admitting: Internal Medicine

## 2018-05-07 ENCOUNTER — Other Ambulatory Visit: Payer: Self-pay

## 2018-05-07 ENCOUNTER — Other Ambulatory Visit: Payer: Self-pay | Admitting: Internal Medicine

## 2018-05-07 DIAGNOSIS — Z1231 Encounter for screening mammogram for malignant neoplasm of breast: Secondary | ICD-10-CM

## 2018-05-07 DIAGNOSIS — N63 Unspecified lump in unspecified breast: Secondary | ICD-10-CM

## 2018-05-08 ENCOUNTER — Other Ambulatory Visit: Payer: Self-pay

## 2018-05-08 NOTE — Telephone Encounter (Signed)
sertraline (ZOLOFT) 25 MG tablet, refill request @  [facility] - [place], [place] - [address] [phone] (Phone) [phone] (Fax)

## 2018-05-08 NOTE — Telephone Encounter (Signed)
Called pharm, pt has refills at pharm, they state they have appr 6 medicines ready for her but she has not responded. Gave them contact #, they will call her

## 2018-05-13 ENCOUNTER — Other Ambulatory Visit: Payer: Self-pay | Admitting: Internal Medicine

## 2018-05-13 ENCOUNTER — Ambulatory Visit
Admission: RE | Admit: 2018-05-13 | Discharge: 2018-05-13 | Disposition: A | Discharge status: Home / Self Care | Payer: Medicare Other | Source: Ambulatory Visit | Attending: Internal Medicine | Admitting: Internal Medicine

## 2018-05-13 ENCOUNTER — Other Ambulatory Visit: Payer: Self-pay

## 2018-05-13 DIAGNOSIS — N63 Unspecified lump in unspecified breast: Secondary | ICD-10-CM

## 2018-05-13 DIAGNOSIS — R928 Other abnormal and inconclusive findings on diagnostic imaging of breast: Secondary | ICD-10-CM | POA: Diagnosis not present

## 2018-05-13 DIAGNOSIS — N6489 Other specified disorders of breast: Secondary | ICD-10-CM | POA: Diagnosis not present

## 2018-05-13 MED ORDER — SERTRALINE HCL 25 MG PO TABS: 50.0000 mg | ORAL_TABLET | Freq: Every day | ORAL | 1 refills | Status: DC

## 2018-05-13 NOTE — Progress Notes (Signed)
Refill of Sertraline sent in as requested by Vanessa Palmer.   [name], MD

## 2018-05-14 ENCOUNTER — Ambulatory Visit: Payer: Self-pay | Admitting: Internal Medicine

## 2018-05-14 ENCOUNTER — Other Ambulatory Visit: Payer: Self-pay

## 2018-05-14 ENCOUNTER — Telehealth: Payer: Self-pay | Admitting: Internal Medicine

## 2018-05-14 NOTE — Telephone Encounter (Signed)
Called Ms. Milley at [facility] to address depression medication, recent screening for axillary mass (negative).  No answer.  Left voicemail asking patient to call the clinic back.   [name], MD

## 2018-05-14 NOTE — Patient Outreach (Signed)
[facility]) Care Management  05/14/2018   Vanessa Palmer June 23, 1950 [id number]  Subjective: Successful outreach to the patient for assessment.  HIPAA verified.  The patient denies any pain but states that she did have a fall with no injury.  She states that she stood up to fast and lost her balance.  Advised patient to take her time when standing to get oriented.  She verbalized understanding.  She states that she has been slack taking her blood pressure because she found a lump under her left arm.   She states that she was told the results yesterday and there is no mass.  She states that now she will go back to monitoring her blood pressure.  She is taking her medications.  She states that she will start a new "depression medications"  Sertraline  50 mg daily.  She also states that she has been crocheting a lot and sounds very happy.  The patient has a follow up with Dr Criselda Peaches in June.   Current Medications:  Current Outpatient Medications  Medication Sig Dispense Refill  . acetaminophen (TYLENOL) 500 MG tablet Take 500-1,000 mg by mouth every 6 (six) hours as needed (for pain).    Marland Kitchen amLODipine (NORVASC) 10 MG tablet Take 1 tablet (10 mg total) by mouth daily. 90 tablet 3  . atorvastatin (LIPITOR) 40 MG tablet Take 1 tablet (40 mg total) by mouth daily at 6 PM. (Patient taking differently: Take 40 mg by mouth daily. ) 90 tablet 3  . EPINEPHrine 0.3 mg/0.3 mL IJ SOAJ injection Inject 0.3 mLs (0.3 mg total) into the muscle as needed for anaphylaxis. 1 Device 3  . fluticasone (FLOVENT HFA) 44 MCG/ACT inhaler Inhale 2 puffs into the lungs 2 (two) times daily. (Patient taking differently: Inhale 2 puffs into the lungs 2 (two) times daily as needed ("for flares"). ) 1 Inhaler 12  . furosemide (LASIX) 40 MG tablet TAKE ONE TABLET BY MOUTH DAILY (Patient taking differently: Take 40 mg by mouth daily. ) 90 tablet 1  . metoprolol succinate (TOPROL-XL) 50 MG 24 hr tablet Take 50 mg by  mouth daily.    . nitroGLYCERIN (NITROSTAT) 0.4 MG SL tablet Place 1 tablet (0.4 mg total) under the tongue every 5 (five) minutes as needed for chest pain. 30 tablet 1  . omeprazole (PRILOSEC) 20 MG capsule TAKE 1 CAPSULE (20 MG TOTAL) BY MOUTH DAILY. 90 capsule 3  . oxybutynin (DITROPAN) 5 MG tablet TAKE ONE TABLET BY MOUTH TWICE A DAY 180 tablet 1  . oxyCODONE-acetaminophen (PERCOCET) 10-325 MG tablet Take 1 tablet by mouth every 8 (eight) hours as needed for up to 30 days for pain (chronic arthritis pain, multiple sites, diag code M16.11, M25.50). 90 tablet 0  . sertraline (ZOLOFT) 25 MG tablet Take 2 tablets (50 mg total) by mouth daily. 180 tablet 1  . traZODone (DESYREL) 50 MG tablet Take 1-2 tablets (50-100 mg total) by mouth at bedtime as needed for sleep. 45 tablet 3  . XARELTO 20 MG TABS tablet TAKE ONE TABLET BY MOUTH DAILY WITH SUPPER 30 tablet 2  . fluticasone (FLONASE) 50 MCG/ACT nasal spray INSTILL TWO SPRAY IN EACH NOSTRIL DAILY AS NEEDED FOR ALLERGIES (Patient not taking: Reported on 05/14/2018) 16 g 2  . [DISCONTINUED] furosemide (LASIX) 20 MG tablet Take one half of the tablet once daily for 5 days. Then stop 10 tablet 0   No current facility-administered medications for this visit.     Functional Status:  In your present state of health, do you have any difficulty performing the following activities: 03/19/2018 03/09/2018  Hearing? N N  Vision? N Y  Difficulty concentrating or making decisions? N N  Walking or climbing stairs? Y Y  Dressing or bathing? Y N  Doing errands, shopping? N N  Preparing Food and eating ? - -  Using the Toilet? - -  In the past six months, have you accidently leaked urine? - -  Do you have problems with loss of bowel control? - -  Managing your Medications? - -  Managing your Finances? - -  Housekeeping or managing your Housekeeping? - -  Some recent data might be hidden    Fall/Depression Screening: Fall Risk  05/14/2018 03/24/2018 03/19/2018   Falls in the past year? 1 1 1   Number falls in past yr: 0 0 1  Injury with Fall? 0 0 0  Risk Factor Category  - - -  Comment - - -  Risk for fall due to : Impaired balance/gait - Impaired balance/gait  Risk for fall due to: Comment - - -  Follow up Education provided - Falls prevention discussed   PHQ 2/9 Scores 03/19/2018 03/06/2018 01/08/2018 12/04/2017 10/17/2017 08/07/2017 05/01/2017  PHQ - 2 Score 2 3 5 5 3  0 0  PHQ- 9 Score 10 12 18 17 17  - -    Assessment: Patient will continue to benefit from health coach outreach for disease management and support. [facility] CM Care Plan Problem One     Most Recent Value  [facility] Long Term Goal   Member  wil report some weight loss of 1-2 pounds and checking blood pressures daily in 60 days  [facility] Long Term Goal Start Date  05/14/18  Interventions for Problem One Long Term Goal  Reinterate with the patient about checking her bp.  monitoring salt in the diet and medication adherence.       Plan: RN Health Coach will contact patient in the month of May and patient agrees to next outreach.   Juanell Fairly RN, BSN, [facility] RN Health Coach Disease Management [facility] Dial:  [phone]  Fax: [phone]

## 2018-05-21 ENCOUNTER — Telehealth: Payer: Self-pay | Admitting: Internal Medicine

## 2018-05-21 DIAGNOSIS — M2550 Pain in unspecified joint: Secondary | ICD-10-CM

## 2018-05-21 NOTE — Telephone Encounter (Signed)
Called Vanessa Palmer to check on her.  Confirmed her identity with name and DOB.  She is doing very well, sounds very positive, feels the sertraline is working well.  I attempted to do a PHQ-9 over the phone and she reported no to all questions - effectively a 0.  May need to do again in the near future.  She states that she is staying home and crocheting and doing well.   She notes that the charger for her powered wheelchair is broken and she needs a new one.  Received from [employer].    Will send a note to DME coordinator to hopefully get assessed for a new one.   [name], MD

## 2018-05-21 NOTE — Telephone Encounter (Signed)
DME order placed.

## 2018-05-21 NOTE — Telephone Encounter (Signed)
Following [employer] received from [employer] at [employer]:  Thank you. I have sent this message to our power wheelchair repair team!

## 2018-05-21 NOTE — Addendum Note (Signed)
Addended by: [name] B on: 05/21/2018 11:02 AM   Modules accepted: Orders

## 2018-05-21 NOTE — Telephone Encounter (Signed)
Community Message sent to Zenia Resides at [facility] that patient needs replacement charger for her motorized chair. Kinnie Feil, RN, BSN

## 2018-05-22 ENCOUNTER — Other Ambulatory Visit: Payer: Self-pay | Admitting: Internal Medicine

## 2018-05-22 DIAGNOSIS — M2550 Pain in unspecified joint: Secondary | ICD-10-CM

## 2018-05-22 NOTE — Telephone Encounter (Signed)
Refill Request   oxyCODONE-acetaminophen (PERCOCET) 10-325 MG tablet   [id number] - [place], [place] - [address] AT [address] OF [address] 

## 2018-05-22 NOTE — Telephone Encounter (Signed)
Last office visit: 04/15/18 (ACC) and 03/19/18 (PCP) Last UDS: 12/04/17 Last Refill: last written on 04/25/18 Next appt: 07/30/18 w/pcp

## 2018-05-23 DIAGNOSIS — M1990 Unspecified osteoarthritis, unspecified site: Secondary | ICD-10-CM | POA: Diagnosis not present

## 2018-05-23 DIAGNOSIS — M2550 Pain in unspecified joint: Secondary | ICD-10-CM | POA: Diagnosis not present

## 2018-05-23 DIAGNOSIS — G4733 Obstructive sleep apnea (adult) (pediatric): Secondary | ICD-10-CM | POA: Diagnosis not present

## 2018-05-23 MED ORDER — OXYCODONE-ACETAMINOPHEN 10-325 MG PO TABS: 1.0000 | ORAL_TABLET | Freq: Three times a day (TID) | ORAL | 0 refills | Status: DC | PRN

## 2018-05-26 NOTE — Telephone Encounter (Addendum)
Following message received from Zenia Resides at [facility]:  Just an update this has been scheduled to deliver the charger for her power wheelchair.

## 2018-05-26 NOTE — Telephone Encounter (Signed)
Thank you :)

## 2018-06-06 DIAGNOSIS — J1289 Other viral pneumonia: Secondary | ICD-10-CM | POA: Diagnosis not present

## 2018-06-18 ENCOUNTER — Other Ambulatory Visit: Payer: Self-pay | Admitting: Internal Medicine

## 2018-06-18 DIAGNOSIS — E785 Hyperlipidemia, unspecified: Secondary | ICD-10-CM

## 2018-06-20 ENCOUNTER — Other Ambulatory Visit: Payer: Self-pay | Admitting: Internal Medicine

## 2018-06-20 DIAGNOSIS — M2550 Pain in unspecified joint: Secondary | ICD-10-CM

## 2018-06-20 MED ORDER — OXYCODONE-ACETAMINOPHEN 10-325 MG PO TABS: 1.0000 | ORAL_TABLET | Freq: Three times a day (TID) | ORAL | 0 refills | Status: DC | PRN

## 2018-06-20 NOTE — Telephone Encounter (Signed)
Needs refill on oxyCODONE-acetaminophen (PERCOCET) 10-325 MG tablet  [id number] - [place], [place] - [address] AT [facility] OF [address] ; pt contact [phone]

## 2018-06-20 NOTE — Telephone Encounter (Signed)
Dr. Mikey Bussing or Dr. Rogelia Boga -  Could you please send this one in as well?  I will get this fixed before I work from home again!  Thank you!  [name], MD

## 2018-06-25 ENCOUNTER — Other Ambulatory Visit: Payer: Self-pay

## 2018-06-25 ENCOUNTER — Ambulatory Visit (INDEPENDENT_AMBULATORY_CARE_PROVIDER_SITE_OTHER): Payer: Medicare Other | Admitting: Internal Medicine

## 2018-06-25 DIAGNOSIS — N183 Chronic kidney disease, stage 3 (moderate): Secondary | ICD-10-CM | POA: Diagnosis not present

## 2018-06-25 DIAGNOSIS — I129 Hypertensive chronic kidney disease with stage 1 through stage 4 chronic kidney disease, or unspecified chronic kidney disease: Secondary | ICD-10-CM

## 2018-06-25 DIAGNOSIS — J45909 Unspecified asthma, uncomplicated: Secondary | ICD-10-CM

## 2018-06-25 DIAGNOSIS — Z86718 Personal history of other venous thrombosis and embolism: Secondary | ICD-10-CM | POA: Diagnosis not present

## 2018-06-25 DIAGNOSIS — I2510 Atherosclerotic heart disease of native coronary artery without angina pectoris: Secondary | ICD-10-CM

## 2018-06-25 DIAGNOSIS — J4520 Mild intermittent asthma, uncomplicated: Secondary | ICD-10-CM

## 2018-06-25 DIAGNOSIS — Z79899 Other long term (current) drug therapy: Secondary | ICD-10-CM

## 2018-06-25 DIAGNOSIS — I10 Essential (primary) hypertension: Secondary | ICD-10-CM

## 2018-06-25 NOTE — Progress Notes (Signed)
  [facility] Internal Medicine Residency Telephone Encounter Continuity Care Appointment  HPI:   This telephone encounter was created for [name] on 06/25/2018 for the following purpose/cc Follow up on HTN, asthma.  Vanessa Palmer is a 68 year old woman with history of DVT, CAD, HTN, CKD.  She reports to me that she is doing very well, she is just struggling with the isolation of the pandemic.  She had a bit of a meltdown per her a few weeks ago, but is doing better now.  She has absolutely no complaints today and reports having refills on all her meds.  She was thankful for the call.  She did not mention the axillary mass noted at last [facility] visit, I will follow up on this again next time we speak.     Past Medical History:  Past Medical History:  Diagnosis Date  . ALCOHOL ABUSE 12/13/2005   Annotation: Sober since 11/06 Qualifier: Diagnosis of  By: Wallace Cullens MD, Natalia Leatherwood    . Anemia   . Arthritis    "all over" (02/21/2017)  . CAD (coronary artery disease)    s/p non-Q wave MI in 06/2001 and 2004, 2005  . CHF (congestive heart failure) (HCC)   . Chronic kidney disease (CKD), stage III (moderate) (HCC) 09/19/2010  . Chronic mid back pain   . Depression   . DJD (degenerative joint disease)   . DVT (deep venous thrombosis) (HCC)    BLE  . DVT of lower extremity, bilateral (HCC) 04/04/2012   On Xarelto    . GERD (gastroesophageal reflux disease)   . Gout   . Hyperlipidemia   . Hypertension   . INTRINSIC ASTHMA, WITH EXACERBATION 09/27/2009   Qualifier: Diagnosis of  By: Denton Meek MD, Tillie Rung    . Migraine    "none anymore" (02/21/2017)  . Myocardial infarction [facility]) ?2005  . Obesity   . OSA (obstructive sleep apnea)    previously used CPAP, has lost it (02/21/2017)  . Seizures (HCC)    As a teenager       ROS:   HPI + for feeling isolated, but otherwise doing.     Assessment / Plan / Recommendations:   Please see A&P under problem oriented charting for assessment of the  patient's acute and chronic medical conditions.   As always, pt is advised that if symptoms worsen or new symptoms arise, they should go to an urgent care facility or to to ER for further evaluation.   Consent and Medical Decision Making:   This is a telephone encounter between STEPHANNIE KOBAYASHI and [name] on 06/25/2018 for follow up of HTN and asthma. The visit was conducted with the patient located at home and [name] at [facility]. The patient's identity was confirmed using their DOB and current address. The patient has consented to being evaluated through a telephone encounter and understands the associated risks (an examination cannot be done and the patient may need to come in for an appointment) / benefits (allows the patient to remain at home, decreasing exposure to coronavirus). I personally spent 10 minutes on medical discussion.    Call patient in 2-4 weeks to check in with her.  Follow up in 3 months in person if possible.

## 2018-06-27 NOTE — Assessment & Plan Note (Signed)
Doing well, no SOB, no wheezing.  Continue current medications.

## 2018-06-27 NOTE — Assessment & Plan Note (Signed)
Doing well.  She does not check her BP at home.  Continue current medications.

## 2018-06-27 NOTE — Patient Instructions (Signed)
Instructions given over the phone.  

## 2018-07-07 ENCOUNTER — Other Ambulatory Visit: Payer: Self-pay | Admitting: Internal Medicine

## 2018-07-07 DIAGNOSIS — I82403 Acute embolism and thrombosis of unspecified deep veins of lower extremity, bilateral: Secondary | ICD-10-CM

## 2018-07-11 ENCOUNTER — Other Ambulatory Visit: Payer: Self-pay

## 2018-07-11 NOTE — Patient Outreach (Signed)
[facility] Care Management  07/11/2018  EOWYN HARBERTS 07-16-1950 [id number]    1st unsuccessful outreach attempt.  No answer.  HIPAA compliant voicemail left with contact information.  Plan: RN Health Coach will send letter. Rn Health Coach will make outreach attempt to the patient within thirty business days.  Juanell Fairly RN, BSN, [facility] RN Health Coach Disease Management [facility] Dial:  [phone]  Fax: [phone]

## 2018-07-22 ENCOUNTER — Other Ambulatory Visit: Payer: Self-pay

## 2018-07-22 DIAGNOSIS — M2550 Pain in unspecified joint: Secondary | ICD-10-CM

## 2018-07-22 MED ORDER — OXYCODONE-ACETAMINOPHEN 10-325 MG PO TABS: 1.0000 | ORAL_TABLET | Freq: Three times a day (TID) | ORAL | 0 refills | Status: DC | PRN

## 2018-07-22 NOTE — Telephone Encounter (Signed)
oxyCODONE-acetaminophen (PERCOCET) 10-325 MG tablet(Expired)   Refill request @  [id number] - [place], [place] - [address] AT [facility] OF [address] & [address] [phone] (Phone) [phone] (Fax)

## 2018-07-22 NOTE — Telephone Encounter (Signed)
Last rx written 06/20/18. Last OV 06/25/18. Next OV  07/30/18. UDS 12/04/17.

## 2018-07-30 ENCOUNTER — Other Ambulatory Visit: Payer: Self-pay

## 2018-07-30 ENCOUNTER — Ambulatory Visit (INDEPENDENT_AMBULATORY_CARE_PROVIDER_SITE_OTHER): Payer: Medicare Other | Admitting: Internal Medicine

## 2018-07-30 ENCOUNTER — Encounter: Payer: Self-pay | Admitting: Internal Medicine

## 2018-07-30 DIAGNOSIS — M2550 Pain in unspecified joint: Secondary | ICD-10-CM | POA: Diagnosis not present

## 2018-07-30 DIAGNOSIS — G479 Sleep disorder, unspecified: Secondary | ICD-10-CM | POA: Diagnosis not present

## 2018-07-30 DIAGNOSIS — N183 Chronic kidney disease, stage 3 unspecified: Secondary | ICD-10-CM

## 2018-07-30 DIAGNOSIS — Z6841 Body Mass Index (BMI) 40.0 and over, adult: Secondary | ICD-10-CM

## 2018-07-30 DIAGNOSIS — K219 Gastro-esophageal reflux disease without esophagitis: Secondary | ICD-10-CM

## 2018-07-30 DIAGNOSIS — F331 Major depressive disorder, recurrent, moderate: Secondary | ICD-10-CM

## 2018-07-30 DIAGNOSIS — Z7901 Long term (current) use of anticoagulants: Secondary | ICD-10-CM

## 2018-07-30 DIAGNOSIS — Z79891 Long term (current) use of opiate analgesic: Secondary | ICD-10-CM

## 2018-07-30 DIAGNOSIS — E785 Hyperlipidemia, unspecified: Secondary | ICD-10-CM

## 2018-07-30 DIAGNOSIS — Z872 Personal history of diseases of the skin and subcutaneous tissue: Secondary | ICD-10-CM

## 2018-07-30 DIAGNOSIS — M1611 Unilateral primary osteoarthritis, right hip: Secondary | ICD-10-CM

## 2018-07-30 DIAGNOSIS — I120 Hypertensive chronic kidney disease with stage 5 chronic kidney disease or end stage renal disease: Secondary | ICD-10-CM | POA: Diagnosis not present

## 2018-07-30 DIAGNOSIS — I82403 Acute embolism and thrombosis of unspecified deep veins of lower extremity, bilateral: Secondary | ICD-10-CM

## 2018-07-30 DIAGNOSIS — E7800 Pure hypercholesterolemia, unspecified: Secondary | ICD-10-CM

## 2018-07-30 DIAGNOSIS — G8929 Other chronic pain: Secondary | ICD-10-CM

## 2018-07-30 DIAGNOSIS — Z86718 Personal history of other venous thrombosis and embolism: Secondary | ICD-10-CM

## 2018-07-30 DIAGNOSIS — R2232 Localized swelling, mass and lump, left upper limb: Secondary | ICD-10-CM

## 2018-07-30 DIAGNOSIS — Z79899 Other long term (current) drug therapy: Secondary | ICD-10-CM

## 2018-07-30 DIAGNOSIS — Z0000 Encounter for general adult medical examination without abnormal findings: Secondary | ICD-10-CM

## 2018-07-30 DIAGNOSIS — M1990 Unspecified osteoarthritis, unspecified site: Secondary | ICD-10-CM | POA: Diagnosis not present

## 2018-07-30 DIAGNOSIS — I2510 Atherosclerotic heart disease of native coronary artery without angina pectoris: Secondary | ICD-10-CM | POA: Diagnosis not present

## 2018-07-30 DIAGNOSIS — G4733 Obstructive sleep apnea (adult) (pediatric): Secondary | ICD-10-CM | POA: Diagnosis not present

## 2018-07-30 DIAGNOSIS — I10 Essential (primary) hypertension: Secondary | ICD-10-CM

## 2018-07-30 MED ORDER — RAMELTEON 8 MG PO TABS: 8.0000 mg | ORAL_TABLET | Freq: Every day | ORAL | 1 refills | Status: DC

## 2018-07-30 MED ORDER — SERTRALINE HCL 100 MG PO TABS: 100.0000 mg | ORAL_TABLET | Freq: Every day | ORAL | 6 refills | Status: DC

## 2018-07-30 NOTE — Assessment & Plan Note (Signed)
She is taking her statin without issue.  Will continue this medication given her history of CAD.

## 2018-07-30 NOTE — Assessment & Plan Note (Signed)
This was our main area of discussion.  She continues to be uncontrolled in her depression and her sleep is also affected.   Plan Increase sertraline to 100mg  daily STOP trazodone Trial of Ramelteon 8mg  at night for sleep.

## 2018-07-30 NOTE — Assessment & Plan Note (Signed)
She is due for a DEXA which she will get with her next mammogram at the same time.  We discussed today and I showed her a picture of the DEXA machine.  Assessment for lung cancer screening at next visit.

## 2018-07-30 NOTE — Assessment & Plan Note (Signed)
Well controlled on omeprazole.   Plan Continue omeprazole.

## 2018-07-30 NOTE — Assessment & Plan Note (Signed)
We discussed this a little bit today.  She is limited by pain and by isolation due to coronavirus.  We will continue to discuss at each visit.

## 2018-07-30 NOTE — Assessment & Plan Note (Signed)
This seems to be resolved.  She had an [name] and MMG in March and there was no palpable mass and no findings concerning for malignancy.  She notes that the mass just went away "back to the devil where it came from."  On exam today, she had no palpable mass, no pain or tenderness.  Will continue to monitor.  Advised her to inform us if the mass should return.  Resolved.

## 2018-07-30 NOTE — Patient Instructions (Signed)
Vanessa Palmer - It was a pleasure to see you today!  For your mood, depression - Please INCREASE your sertraline to 100mg  at night.  I sent in a new prescription for you.   For your sleep - please STOP trazodone.  Please start taking Ramelteon (rozerem) 8mg  at night and see if this helps.  More information is below.    Ramelteon tablets What is this medicine? RAMELTEON (ram EL tee on) is used to treat insomnia. This medicine helps you to fall asleep. This medicine may be used for other purposes; ask your health care provider or pharmacist if you have questions. COMMON BRAND NAME(S): Rozerem What should I tell my health care provider before I take this medicine? They need to know if you have any of these conditions: -depression -history of a drug or alcohol abuse problem -liver disease -lung or breathing disease -suicidal thoughts -an unusual or allergic reaction to ramelteon, other medicines, foods, dyes, or preservatives -pregnant or trying to get pregnant -breast-feeding How should I use this medicine? Take this medicine by mouth with a glass of water. Do not break tablets; swallow whole. Follow the directions on the prescription label. It is better to take this medicine on an empty stomach and only when you are ready for bed. Do not take your medicine more often than directed. A special MedGuide will be given to you by the pharmacist with each prescription and refill. Be sure to read this information carefully each time. Talk to your pediatrician regarding the use of this medicine in children. Special care may be needed. Overdosage: If you think you have taken too much of this medicine contact a poison control center or emergency room at once. NOTE: This medicine is only for you. Do not share this medicine with others. What if I miss a dose? This does not apply. This medicine should only be taken immediately before going to sleep. Do not take double or extra doses. What may interact  with this medicine? Do not take this medicine with any of the following medications: -fluvoxamine -melatonin This medicine may also interact with the following medications: -medicines used to treat fungal infections like ketoconazole, fluconazole, or itraconazole -rifampin This list may not describe all possible interactions. Give your health care provider a list of all the medicines, herbs, non-prescription drugs, or dietary supplements you use. Also tell them if you smoke, drink alcohol, or use illegal drugs. Some items may interact with your medicine. What should I watch for while using this medicine? Visit your doctor or health care professional for regular checks on your progress. Keep a regular sleep schedule by going to bed at about the same time each night. Avoid caffeine-containing drinks in the evening hours. Talk to your doctor if you still have trouble sleeping within 7 to 10 days of using this medicine. This may mean there is another cause for your sleep problems. After taking this medicine, you may get up out of bed and do an activity that you do not know you are doing. The next morning, you may have no memory of this. Activities include driving a car ("sleep-driving"), making and eating food, talking on the phone, sexual activity, and sleep-walking. Serious injuries have occurred. Call your doctor right away if you find out you have done any of these activities. Do not take this medicine if you have used alcohol that evening. Do not take it if you have taken another medicine for sleep. The risk of doing these sleep-related activities is  higher. Do not take this medicine unless you are able to stay in bed for a full night (7 to 8 hours) before you must be active again. You may have a decrease in mental alertness the day after use, even if you feel that you are fully awake. Tell your doctor if you will need to perform activities requiring full alertness, such as driving, the next day. Do not  stand or sit up quickly after taking this medicine, especially if you are an older patient. This reduces the risk of dizzy or fainting spells. If you or your family notice any changes in your behavior, such as new or worsening depression, thoughts of harming yourself, anxiety, other unusual or disturbing thoughts, or memory loss, call your doctor right away. What side effects may I notice from receiving this medicine? Side effects that you should report to your doctor or health care professional as soon as possible: -allergic reactions like skin rash, itching or hives, swelling of the face, lips, or tongue -breathing problems -joint or muscle pain -depression, suicidal thoughts -missed monthly period (for women) -unusual activities while asleep like driving, eating, making phone calls -unusually weak or tired -worsening of insomnia Side effects that usually do not require medical attention (report to your doctor or health care professional if they continue or are bothersome): -bad taste -daytime sleepiness -decreased sex drive -diarrhea -headache -nausea This list may not describe all possible side effects. Call your doctor for medical advice about side effects. You may report side effects to FDA at 1-800-FDA-1088. Where should I keep my medicine? Keep out of the reach of children. Store at room temperature between 15 and 30 degrees C (59 and 86 degrees F). Keep the container tightly closed. Protect from moisture and humidity. Throw away any unused medicine after the expiration date. NOTE: This sheet is a summary. It may not cover all possible information. If you have questions about this medicine, talk to your doctor, pharmacist, or health care provider.  2019 Elsevier/Gold Standard (2017-08-09 12:18:24)

## 2018-07-30 NOTE — Assessment & Plan Note (Signed)
>>  ASSESSMENT AND PLAN FOR CORONARY ATHEROSCLEROSIS WRITTEN ON 07/30/2018  9:56 AM BY Caylee Vlachos B, MD  She is doing well.  She follows with Dr. Levern and is due to see him in the near future.  She has occasional angina type symptoms, but they resolve on their own without taking any nitroglycerin .  She is on a beta blocker and her HR is well controlled today.  She is on a statin.  She has no dizziness, headaches.   Plan Continue follow up with Cardiology and current medications.

## 2018-07-30 NOTE — Assessment & Plan Note (Signed)
She has chronic pain of the hips and knees.  She uses a 4 point cane and a motorized chair.  She does not report any worsening of pain which is controlled with her current pain regimen.  PDMP was recently reviewed and appropriate.    Continue current pain regimen.  She does not need refills.  UDS at next visit if needed.    She uses this medication to maintain exercise and ability to care for her grandkids.

## 2018-07-30 NOTE — Assessment & Plan Note (Signed)
She is taking Xarelto now, lifelong. She has no issues with the medications.  Reports no bleeding or rash.   Plan Continue Xarelto.

## 2018-07-30 NOTE — Assessment & Plan Note (Signed)
BP was a little elevated today.  She does report taking her medications.  She has been controlled on amlodipine in the past.  If she continues to be elevated at next visit, will make changes to her regimen.   Plan Continue amlodipine, metoprolol.

## 2018-07-30 NOTE — Progress Notes (Signed)
Subjective:    Patient ID: Vanessa Palmer, female    DOB: 1951-01-23, 68 y.o.   MRN: [id number]  CC: 1 month follow up for symptoms of depression, sleep  HPI  Vanessa Palmer is a 68 year old woman with PMH of HTN, CAD/LVH, chronic hip/knee pain, obesity, h/o DVT on xarelto who presents for problems with sleep and mood.   Vanessa Palmer reports that she is having a very tough time sleeping.  She is not sure what keeps her up. She takes trazodone 100mg  at night, is able to fall asleep for 2-3 hours, however, she has to get up to go to the bathroom and then she is up the rest of the night.  We reviewed her sleep hygiene and it seems adequate.  She has a dark, quiet room to sleep in.  She does not have a TV in her room.  She does not have any pets or bed partners.  She is not very active during the day, but she does not drink caffeine before bed.  She does not drink coffee or soda.  She does not nap during the day.  She does have uncontrolled depression for which we have been uptitrating her sertraline.  She is now on 50mg  daily and she reports taking this regularly.  Her PHQ-9 is 17 today.  She would like to try something new for her sleep.   She notes that she is otherwise doing well.  Her pain is controlled on current therapy.  She has occasional chest pains, but not bad enough to take nitroglycerin.  She follows with Dr. Sharyn LullHarwani and is taking her medication. Her BP was initially elevated to 156/99, but then was 148/80 on recheck.  She took her medications this morning. She has been controlled on this regimen in the past.      She showed me her new necklace today which has emblems for her three daughters, [place], [place] and [place].  Her Daughter [place] passed in 2004, May 5 was her birthday.  She and her family and friends gathered to release balloons in her honor.    Review of Systems  Constitutional: Negative for activity change, appetite change, fatigue and fever.  Respiratory: Negative for  shortness of breath.   Cardiovascular: Positive for chest pain (occasional).  Genitourinary: Negative for difficulty urinating and dysuria.  Musculoskeletal: Positive for arthralgias, back pain and gait problem (chronic).  Psychiatric/Behavioral: Positive for decreased concentration, dysphoric mood and sleep disturbance. The patient is nervous/anxious.        Objective:   Physical Exam Vitals signs and nursing note reviewed.  Constitutional:      Appearance: Normal appearance. She is obese. She is not diaphoretic.     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    HENT:     Head: Normocephalic and atraumatic.  Cardiovascular:     Rate and Rhythm: Normal rate and regular rhythm.     Heart sounds: No murmur.  Pulmonary:     Effort: Pulmonary effort is normal. No respiratory distress.     Breath sounds: Normal breath sounds. No wheezing.  Chest:     Chest wall: No tenderness.  Musculoskeletal:     Comments: Left axilla was normal, no palpable mass, no pain or skin changes.   Neurological:     Mental Status: She is alert.  Psychiatric:        Mood and Affect: Mood normal.        Behavior: Behavior normal.  Comments: She has a normal mood, however, she became a little sad when talking about her daughter who passed.  The pandemic and isolation is hard for her and her mood is down.      No labs today.        Assessment & Plan:  Return in 3 months, sooner if needed. Telehealth visit in 1 month to see how she is doing with sleep.

## 2018-07-30 NOTE — Assessment & Plan Note (Signed)
She has normal urination today, no apparent change.  Recent renal function was checked in January and was stable.  She has some mild normocytic anemia which is related.  It will be important to have good control of her BP going forward and avoid nephrotoxic medications.  She is not taking NSAIDS for her pain.

## 2018-07-30 NOTE — Assessment & Plan Note (Signed)
She is doing well.  She follows with Dr. Sharyn Lull and is due to see him in the near future.  She has occasional angina type symptoms, but they resolve on their own without taking any nitroglycerin.  She is on a beta blocker and her HR is well controlled today.  She is on a statin.  She has no dizziness, headaches.   Plan Continue follow up with Cardiology and current medications.

## 2018-08-01 ENCOUNTER — Telehealth: Payer: Self-pay | Admitting: Internal Medicine

## 2018-08-01 NOTE — Telephone Encounter (Signed)
Pt is requesting handicapp tag for car; pt contact [phone]

## 2018-08-01 NOTE — Telephone Encounter (Signed)
Application for Disability Parking Placard placed in PCP's box for completion. Kinnie Feil, RN, BSN

## 2018-08-04 ENCOUNTER — Other Ambulatory Visit: Payer: Self-pay | Admitting: Internal Medicine

## 2018-08-04 NOTE — Telephone Encounter (Signed)
Patient notified form is ready. Requesting it to be mailed to her. Placed in outgoing mail. Hubbard Hartshorn, RN, BSN

## 2018-08-04 NOTE — Telephone Encounter (Signed)
Completed.  Thank you! 

## 2018-08-07 DIAGNOSIS — I25118 Atherosclerotic heart disease of native coronary artery with other forms of angina pectoris: Secondary | ICD-10-CM | POA: Diagnosis not present

## 2018-08-07 DIAGNOSIS — I252 Old myocardial infarction: Secondary | ICD-10-CM | POA: Diagnosis not present

## 2018-08-07 DIAGNOSIS — I10 Essential (primary) hypertension: Secondary | ICD-10-CM | POA: Diagnosis not present

## 2018-08-07 DIAGNOSIS — N189 Chronic kidney disease, unspecified: Secondary | ICD-10-CM | POA: Diagnosis not present

## 2018-08-07 DIAGNOSIS — E785 Hyperlipidemia, unspecified: Secondary | ICD-10-CM | POA: Diagnosis not present

## 2018-08-11 ENCOUNTER — Other Ambulatory Visit: Payer: Self-pay

## 2018-08-11 NOTE — Patient Outreach (Signed)
[facility]) Care Management  08/11/2018  Vanessa Palmer 1950/08/09 [id number]    1st unsuccessful outreach to the patient for assessment.  No answer.  HIPAA compliant voicemail left with contact information.   Plan: RN Health Coach will send letter. [place] will make outreach attempt to the patient within thirty business days.  Lazaro Arms RN, BSN, [place] Direct Dial:  [phone]  Fax: [phone]

## 2018-08-18 ENCOUNTER — Other Ambulatory Visit: Payer: Self-pay | Admitting: Internal Medicine

## 2018-08-19 ENCOUNTER — Other Ambulatory Visit: Payer: Self-pay

## 2018-08-19 NOTE — Patient Outreach (Signed)
[facility] Care Management  08/19/2018   Vanessa Palmer 09-Jul-1950 [id number]  Subjective: Return call from the patient.  Two patient identifier obtained.  The patient states that she has been doing "so-so".  Although, she is very responsive and sounds happy on the phone. She states that she has been having a problem of when she eats she goes to the bathroom after. She states that this has happened for quite a while. She does not complain of any other symptoms or stomach pain.  Advised the patient to call her physician office and let them know.  She stated she would.  The patient states that she is having chronic pain in her legs that she rates at a 8/10.  She states she has taken her pain medication and it helps.  She states that she had a fall since we  last talked.  She was getting out of bed and her knee gave out.  She states she didn't injure herself just a little sore.  Advise the patient to keep her walker by her bed when she gets up for stability.  She verbalized understanding.     She states that she has been scheduled to have a Dexa scan done when she has her mammogram.  She states that her blood pressure has been doing fine.  She stated she checked the other day and it was 150/89.   She states that she does not check her blood pressure daily but once or twice a week.  Advised her to check it daily to be aware of any trends.  She verbalized understanding.  She states she does monitor the salt in her diet.  She has all of her medications and had a couple of changes.  She takes a 100 mg of Sertraline and started a new sleep medication Rozerem 8mg .  She states that she has only taken one pill of the Rozerem and is unable to find it.  She thinks she has misplaced in her home.  She states that she will look for it.    Current Medications:  Current Outpatient Medications  Medication Sig Dispense Refill  . acetaminophen (TYLENOL) 500 MG tablet Take 500-1,000 mg by mouth every 6  (six) hours as needed (for pain).    Marland Kitchen. amLODipine (NORVASC) 10 MG tablet Take 1 tablet (10 mg total) by mouth daily. 90 tablet 3  . atorvastatin (LIPITOR) 40 MG tablet Take 1 tablet (40 mg total) by mouth daily. 90 tablet 3  . EPINEPHrine 0.3 mg/0.3 mL IJ SOAJ injection Inject 0.3 mLs (0.3 mg total) into the muscle as needed for anaphylaxis. 1 Device 3  . FLOVENT HFA 44 MCG/ACT inhaler INHALE TWO PUFFS INTO THE LUNGS TWICE A DAY 31.8 g 11  . furosemide (LASIX) 40 MG tablet Take 1 tablet (40 mg total) by mouth daily. 90 tablet 3  . metoprolol succinate (TOPROL-XL) 50 MG 24 hr tablet Take 50 mg by mouth daily.    . nitroGLYCERIN (NITROSTAT) 0.4 MG SL tablet PLACE ONE TABLET UNDER THE TONGUE EVERY FIVE MINUTES AS NEEDED FOR CHEST PAIN 25 tablet 0  . omeprazole (PRILOSEC) 20 MG capsule TAKE 1 CAPSULE (20 MG TOTAL) BY MOUTH DAILY. 90 capsule 3  . oxybutynin (DITROPAN) 5 MG tablet TAKE ONE TABLET BY MOUTH TWICE A DAY 180 tablet 1  . oxyCODONE-acetaminophen (PERCOCET) 10-325 MG tablet Take 1 tablet by mouth every 8 (eight) hours as needed for up to 30 days for pain (chronic arthritis pain, multiple  sites, diag code M16.11, M25.50). 90 tablet 0  . ramelteon (ROZEREM) 8 MG tablet Take 1 tablet (8 mg total) by mouth at bedtime. 30 tablet 1  . sertraline (ZOLOFT) 100 MG tablet Take 1 tablet (100 mg total) by mouth at bedtime. 30 tablet 6  . XARELTO 20 MG TABS tablet TAKE ONE TABLET BY MOUTH DAILY WITH SUPPER 30 tablet 1  . [DISCONTINUED] furosemide (LASIX) 20 MG tablet Take one half of the tablet once daily for 5 days. Then stop 10 tablet 0   No current facility-administered medications for this visit.     Functional Status:  In your present state of health, do you have any difficulty performing the following activities: 07/30/2018 03/19/2018  Hearing? N N  Vision? N N  Difficulty concentrating or making decisions? N N  Walking or climbing stairs? Y Y  Dressing or bathing? Y Y  Doing errands, shopping? Y  N  Preparing Food and eating ? - -  Using the Toilet? - -  In the past six months, have you accidently leaked urine? - -  Do you have problems with loss of bowel control? - -  Managing your Medications? - -  Managing your Finances? - -  Housekeeping or managing your Housekeeping? - -  Some recent data might be hidden    Fall/Depression Screening: Fall Risk  08/19/2018 07/30/2018 05/14/2018  Falls in the past year? 1 1 1   Number falls in past yr: 0 1 0  Injury with Fall? 0 0 0  Risk Factor Category  - - -  Comment - - -  Risk for fall due to : Other (Comment) Impaired mobility;Impaired balance/gait Impaired balance/gait  Risk for fall due to: Comment Knee gave out - -  Follow up Falls prevention discussed Falls prevention discussed Education provided   [facility] 2/9 Scores 07/30/2018 03/19/2018 03/06/2018 01/08/2018 12/04/2017 10/17/2017 08/07/2017  PHQ - 2 Score 5 2 3 5 5 3  0  PHQ- 9 Score 17 10 12 18 17 17  -    Assessment: Patient will continue to benefit from health coach outreach for disease management and support. [facility] CM Care Plan Problem One     Most Recent Value  [facility] Long Term Goal   In 60 days the patient wwill verbalize that she is checking her blood pressure daily,  [facility] Long Term Goal Start Date  08/19/18  Interventions for Problem One Long Term Goal  Reviewed low sodium diet, checking her blood pressure daily, usage of her walker and encouraged medication adherence.     Plan: RN Health Coach will contact patient in the month of August and patient agrees to next outreach. Will send a copy of today's note to the her PCP for review.  Lazaro Arms RN, BSN, [place] Direct Dial:  [phone]  Fax: [phone]

## 2018-08-21 ENCOUNTER — Other Ambulatory Visit: Payer: Self-pay

## 2018-08-21 ENCOUNTER — Ambulatory Visit: Payer: Self-pay

## 2018-08-21 DIAGNOSIS — M2550 Pain in unspecified joint: Secondary | ICD-10-CM

## 2018-08-21 MED ORDER — OXYCODONE-ACETAMINOPHEN 10-325 MG PO TABS: 1.0000 | ORAL_TABLET | Freq: Three times a day (TID) | ORAL | 0 refills | Status: DC | PRN

## 2018-08-21 NOTE — Telephone Encounter (Signed)
Last rx written  07/22/18. Last OV  07/30/18. Next OV  09/19/18. UDS  12/04/17.

## 2018-08-21 NOTE — Telephone Encounter (Signed)
oxyCODONE-acetaminophen (PERCOCET) 10-325 MG tablet, REFILL REQUEST @  [id number] - [place], [place] - [address] AT [address] [phone] (Phone) [phone] (Fax)    

## 2018-09-08 DIAGNOSIS — M1990 Unspecified osteoarthritis, unspecified site: Secondary | ICD-10-CM | POA: Diagnosis not present

## 2018-09-08 DIAGNOSIS — M2550 Pain in unspecified joint: Secondary | ICD-10-CM | POA: Diagnosis not present

## 2018-09-08 DIAGNOSIS — G4733 Obstructive sleep apnea (adult) (pediatric): Secondary | ICD-10-CM | POA: Diagnosis not present

## 2018-09-17 ENCOUNTER — Other Ambulatory Visit: Payer: Self-pay | Admitting: Internal Medicine

## 2018-09-18 ENCOUNTER — Other Ambulatory Visit: Payer: Self-pay | Admitting: Internal Medicine

## 2018-09-18 DIAGNOSIS — M2550 Pain in unspecified joint: Secondary | ICD-10-CM

## 2018-09-18 NOTE — Telephone Encounter (Signed)
Refill Request   oxyCODONE-acetaminophen (PERCOCET) 10-325 MG tablet   [id number] - , [place] - [address] 

## 2018-09-19 ENCOUNTER — Encounter: Payer: Medicare Other | Admitting: Internal Medicine

## 2018-09-19 MED ORDER — OXYCODONE-ACETAMINOPHEN 10-325 MG PO TABS: 1.0000 | ORAL_TABLET | Freq: Three times a day (TID) | ORAL | 0 refills | Status: DC | PRN

## 2018-09-20 ENCOUNTER — Other Ambulatory Visit: Payer: Self-pay | Admitting: Internal Medicine

## 2018-09-26 ENCOUNTER — Other Ambulatory Visit: Payer: Self-pay

## 2018-09-26 ENCOUNTER — Ambulatory Visit (INDEPENDENT_AMBULATORY_CARE_PROVIDER_SITE_OTHER): Payer: Medicare Other | Admitting: Internal Medicine

## 2018-09-26 ENCOUNTER — Encounter (INDEPENDENT_AMBULATORY_CARE_PROVIDER_SITE_OTHER): Payer: Self-pay

## 2018-09-26 DIAGNOSIS — Z79899 Other long term (current) drug therapy: Secondary | ICD-10-CM

## 2018-09-26 DIAGNOSIS — F331 Major depressive disorder, recurrent, moderate: Secondary | ICD-10-CM | POA: Diagnosis not present

## 2018-09-26 DIAGNOSIS — G4700 Insomnia, unspecified: Secondary | ICD-10-CM | POA: Diagnosis not present

## 2018-09-26 MED ORDER — ZALEPLON 5 MG PO CAPS: 5.0000 mg | ORAL_CAPSULE | Freq: Every evening | ORAL | 0 refills | Status: DC | PRN

## 2018-09-26 NOTE — Progress Notes (Signed)
  [facility] Internal Medicine Residency Telephone Encounter Continuity Care Appointment  HPI:   This telephone encounter was created for [name] on 09/26/2018 for the following purpose/cc follow up on mood, sleep.  Vanessa Palmer reports that she has not been sleeping. We went through her sleep hygiene.  She will attempt to go to sleep around 10pm every night, but finds that she stays awake until 4am and sometimes 6 am before falling asleep.  She sleeps in a quiet room, by herself, dark.  She does not feel tired.  She is not very active in the day, mainly crocheting and watching TV.  She does not drink any caffeinated beverages per her, only water.  She does nap during the day including sleeping from 6 - 11 and possibly during her "shows" in the afternoon.  She has tried ramelteon and trazodone without relief.   We discussed hypnotic medications including side effects and she is willing to try this.     Past Medical History:  Past Medical History:  Diagnosis Date  . ALCOHOL ABUSE 12/13/2005   Annotation: Sober since 11/06 Qualifier: Diagnosis of  By: Pearline Cables MD, Belenda Cruise    . Anemia   . Arthritis    "all over" (02/21/2017)  . CAD (coronary artery disease)    s/p non-Q wave MI in 06/2001 and 2004, 2005  . CHF (congestive heart failure) ([place])   . Chronic kidney disease (CKD), stage III (moderate) ([place]) 09/19/2010  . Chronic mid back pain   . Depression   . DJD (degenerative joint disease)   . DVT (deep venous thrombosis) (HCC)    BLE  . DVT of lower extremity, bilateral ([place]) 04/04/2012   On Xarelto    . GERD (gastroesophageal reflux disease)   . Gout   . Hyperlipidemia   . Hypertension   . INTRINSIC ASTHMA, WITH EXACERBATION 09/27/2009   Qualifier: Diagnosis of  By: Stanford Scotland MD, Jiles Garter    . Migraine    "none anymore" (02/21/2017)  . Myocardial infarction [facility]) ?2005  . Obesity   . OSA (obstructive sleep apnea)    previously used CPAP, has lost it (02/21/2017)  . Seizures  ([place])    As a teenager       ROS:   Mood well controlled, she is doing okay at home during the pandemic.    Assessment / Plan / Recommendations:   Please see A&P under problem oriented charting for assessment of the patient's acute and chronic medical conditions.   As always, pt is advised that if symptoms worsen or new symptoms arise, they should go to an urgent care facility or to to ER for further evaluation.   Consent and Medical Decision Making:   This is a telephone encounter between Vanessa Palmer and Gilles Chiquito on 09/26/2018 for insomnia. The visit was conducted with the patient located at home and Gilles Chiquito at [facility]. The patient's identity was confirmed using their DOB and current address. The patient has consented to being evaluated through a telephone encounter and understands the associated risks (an examination cannot be done and the patient may need to come in for an appointment) / benefits (allows the patient to remain at home, decreasing exposure to coronavirus). I personally spent 10 minutes on medical discussion.    Return to clinic in 4 weeks to discuss sleep (Telehealth)

## 2018-09-26 NOTE — Assessment & Plan Note (Signed)
She is doing okay on the higher dose of sertraline.  Still with insomnia, but mood is improved.  No apparent side effects.  No benefit from Ramelteon.   Will try Sonata 5mg  qhs for 7 days. I think she has flipped her wake/sleep cycle.  Will try to flip this back with some medication assistance for 1 week and then see how she is doing.  We will have a follow up visit in 4 weeks, sooner if needed.

## 2018-10-01 ENCOUNTER — Telehealth: Payer: Self-pay | Admitting: *Deleted

## 2018-10-01 NOTE — Telephone Encounter (Signed)
Thank you :)

## 2018-10-01 NOTE — Telephone Encounter (Signed)
Following [employer] received from Andria Rhein with [facility] regarding ties and seat belt for power w/c:   I have referred this to our repair team and it is in process. I will update you as soon as possible.

## 2018-10-03 ENCOUNTER — Other Ambulatory Visit: Payer: Self-pay | Admitting: Internal Medicine

## 2018-10-03 DIAGNOSIS — I82403 Acute embolism and thrombosis of unspecified deep veins of lower extremity, bilateral: Secondary | ICD-10-CM

## 2018-10-06 ENCOUNTER — Telehealth: Payer: Self-pay | Admitting: *Deleted

## 2018-10-06 NOTE — Telephone Encounter (Signed)
Called pt to make appt in [facility], got vmail lm for rtc

## 2018-10-06 NOTE — Telephone Encounter (Signed)
Pt calls and states she has a h/a today that is bad. States she took her BP at 0951- bp 185 109 at 1051 181/109 at 1145 166/89 She states she took her medicine at 80. Denies anything but h/a. She does state she has diarrhea on and off for appr 1 month. She is ask to limit dairy, greasy, spicy foods, sauces, gravies. Ask to eat bland foods when she eats and drink plenty of fluid. She is ask to check her bp daily at 1200 and keep a log. Call for recurring h/a's. Call 911 or seek ED if she develops chest pain, short of breath, N&V, numbness, weakness in hands, legs, feet, vision and speech changes. She is agreeable. Please advise

## 2018-10-06 NOTE — Telephone Encounter (Signed)
Can you please have her come in for an [facility] appointment for her BP?  Thanks

## 2018-10-08 ENCOUNTER — Telehealth: Payer: Self-pay | Admitting: Internal Medicine

## 2018-10-08 NOTE — Telephone Encounter (Signed)
Pt calling to report High Blood Pressure 168/102 and would like a call back as soon as possible.

## 2018-10-09 ENCOUNTER — Other Ambulatory Visit: Payer: Self-pay | Admitting: Internal Medicine

## 2018-10-09 DIAGNOSIS — M2550 Pain in unspecified joint: Secondary | ICD-10-CM

## 2018-10-09 NOTE — Telephone Encounter (Signed)
Lm for rtc again

## 2018-10-15 NOTE — Telephone Encounter (Signed)
Called pt. N/A [name] for pt to call back.

## 2018-10-15 NOTE — Telephone Encounter (Signed)
Please schedule pt an [facility] appt for BP re-check? Thanks

## 2018-10-16 ENCOUNTER — Encounter: Payer: Self-pay | Admitting: Internal Medicine

## 2018-10-16 ENCOUNTER — Telehealth: Payer: Self-pay | Admitting: Internal Medicine

## 2018-10-16 NOTE — Telephone Encounter (Signed)
Called patient [name].  Will mail letter to the patient to contact the office to schedule an appointment as soon as possible.

## 2018-10-16 NOTE — Telephone Encounter (Signed)
Call pt - no answer; left message to call the office.

## 2018-10-16 NOTE — Telephone Encounter (Signed)
Pt Returned phone call and has sch an appt for 10/21/2018 with the [facility],

## 2018-10-16 NOTE — Telephone Encounter (Signed)
Called pt - no answer; left message, on self-identified vm, to call if her BP is still elevated or any other concerns.

## 2018-10-17 NOTE — Telephone Encounter (Signed)
Can you schedule her for an [facility] telehealth visit to discuss her BP.  Thanks!

## 2018-10-17 NOTE — Telephone Encounter (Signed)
Please schedule telehealth visit per Dr Daryll Drown to discuss BP.

## 2018-10-20 ENCOUNTER — Other Ambulatory Visit: Payer: Self-pay

## 2018-10-20 NOTE — Patient Outreach (Signed)
[facility]) Care Management  10/20/2018   Vanessa Palmer [birth year] [id number]  Subjective: Successful outreach to the patient.  Two patient identifiers given.  The patient states that she is doing fair.  She denies any pain or falls today. She reports that her blood pressure has been running high for the past two weeks  ranging from 191/107 to 161/96.  Today her blood pressure was 137/85 pulse 70.  She reports today is the first time she checked her blood pressure in about a week. She states she got frustrated that she could not get her blood pressure down and stopped checking.  Advised the patient that because of her high readings she needs to continue to check her pressures. She verbalized understanding. She states that she has an appointment on 10/21/18 to follow up on her blood pressure. Discussed with her about diet and continuing to monitor her salt intake.  Eating protein and fruits and vegetables.  She is taking her medication as prescribed except her sonata.  She thinks she may have thrown the medication away.  She was cleaning out old medication that she has had for a year or older and put separate pills in a clear bag to take to the pharmacy to dispose of. Advised her to show the pharmacy the medication and they may be able to identify the pill for her if in fact she threw them away.  She verbalized understanding.  Current Medications:  Current Outpatient Medications  Medication Sig Dispense Refill  . acetaminophen (TYLENOL) 500 MG tablet Take 500-1,000 mg by mouth every 6 (six) hours as needed (for pain).    Marland Kitchen. amLODipine (NORVASC) 10 MG tablet Take 1 tablet (10 mg total) by mouth daily. 90 tablet 3  . atorvastatin (LIPITOR) 40 MG tablet Take 1 tablet (40 mg total) by mouth daily. 90 tablet 3  . EPINEPHrine 0.3 mg/0.3 mL IJ SOAJ injection Inject 0.3 mLs (0.3 mg total) into the muscle as needed for anaphylaxis. 1 Device 3  . FLOVENT HFA 44 MCG/ACT inhaler INHALE TWO  PUFFS INTO THE LUNGS TWICE A DAY 31.8 g 11  . furosemide (LASIX) 40 MG tablet Take 1 tablet (40 mg total) by mouth daily. 90 tablet 3  . metoprolol succinate (TOPROL-XL) 50 MG 24 hr tablet Take 50 mg by mouth daily.    . nitroGLYCERIN (NITROSTAT) 0.4 MG SL tablet PLACE ONE TABLET UNDER THE TONGUE EVERY FIVE MINUTES AS NEEDED FOR CHEST PAIN 25 tablet 0  . omeprazole (PRILOSEC) 20 MG capsule TAKE 1 CAPSULE (20 MG TOTAL) BY MOUTH DAILY. 90 capsule 3  . oxybutynin (DITROPAN) 5 MG tablet TAKE ONE TABLET BY MOUTH TWICE A DAY 180 tablet 0  . sertraline (ZOLOFT) 100 MG tablet Take 1 tablet (100 mg total) by mouth at bedtime. 30 tablet 6  . XARELTO 20 MG TABS tablet TAKE ONE TABLET BY MOUTH DAILY WITH SUPPER 90 tablet 3  . zaleplon (SONATA) 5 MG capsule Take 1 capsule (5 mg total) by mouth at bedtime as needed for sleep. 7 capsule 0  . [DISCONTINUED] furosemide (LASIX) 20 MG tablet Take one half of the tablet once daily for 5 days. Then stop 10 tablet 0  . oxyCODONE-acetaminophen (PERCOCET) 10-325 MG tablet Take 1 tablet by mouth every 8 (eight) hours as needed for pain (chronic arthritis pain, multiple sites, diag code M16.11, M25.50). 90 tablet 0   No current facility-administered medications for this visit.     Functional Status:  In your present  state of health, do you have any difficulty performing the following activities: 07/30/2018 03/19/2018  Hearing? N N  Vision? N N  Difficulty concentrating or making decisions? N N  Walking or climbing stairs? Y Y  Dressing or bathing? Y Y  Doing errands, shopping? Y N  Preparing Food and eating ? - -  Using the Toilet? - -  In the past six months, have you accidently leaked urine? - -  Do you have problems with loss of bowel control? - -  Managing your Medications? - -  Managing your Finances? - -  Housekeeping or managing your Housekeeping? - -  Some recent data might be hidden    Fall/Depression Screening: Fall Risk  10/20/2018 08/19/2018 07/30/2018   Falls in the past year? 0 1 1  Number falls in past yr: - 0 1  Injury with Fall? - 0 0  Risk Factor Category  - - -  Comment - - -  Risk for fall due to : - Other (Comment) Impaired mobility;Impaired balance/gait  Risk for fall due to: Comment - Knee gave out -  Follow up - Falls prevention discussed Falls prevention discussed   PHQ 2/9 Scores 07/30/2018 03/19/2018 03/06/2018 01/08/2018 12/04/2017 10/17/2017 08/07/2017  PHQ - 2 Score 5 2 3 5 5 3  0  PHQ- 9 Score 17 10 12 18 17 17  -    Assessment: Patient will continue to benefit from health coach outreach for disease management and support. [facility] CM Care Plan Problem One     Most Recent Value  [facility] Long Term Goal   In 60 days the patient will verbalize that she is checking her blood pressure daily,  [facility] Long Term Goal Start Date  10/20/18  Interventions for Problem One Long Term Goal  Discussed blood pressures and the importance of checking, discussed signs and symptoms of a stoke and what to do, encouraged the patient to continue to monitor the sodium in her diet and eat protein, frusits and vegatabels,  advised her if she eats canned vegetables to pour out the juice and wahs them off before cooking, encouraged medication adherence and keeping her appointments.     Plan: RN Health Coach will contact patient in the month of October and patient agrees to next outreach.   Lazaro Arms RN, BSN, [place] Direct Dial:  [phone]  Fax: [phone]

## 2018-10-21 ENCOUNTER — Ambulatory Visit (INDEPENDENT_AMBULATORY_CARE_PROVIDER_SITE_OTHER): Payer: Medicare Other | Admitting: Internal Medicine

## 2018-10-21 ENCOUNTER — Other Ambulatory Visit: Payer: Self-pay | Admitting: Internal Medicine

## 2018-10-21 ENCOUNTER — Encounter: Payer: Self-pay | Admitting: Internal Medicine

## 2018-10-21 ENCOUNTER — Other Ambulatory Visit: Payer: Self-pay

## 2018-10-21 VITALS — BP 174/92 | HR 58 | Temp 98.4°F | Wt 264.2 lb

## 2018-10-21 DIAGNOSIS — R51 Headache: Secondary | ICD-10-CM | POA: Diagnosis not present

## 2018-10-21 DIAGNOSIS — G4733 Obstructive sleep apnea (adult) (pediatric): Secondary | ICD-10-CM

## 2018-10-21 DIAGNOSIS — Z79899 Other long term (current) drug therapy: Secondary | ICD-10-CM

## 2018-10-21 DIAGNOSIS — M2550 Pain in unspecified joint: Secondary | ICD-10-CM

## 2018-10-21 DIAGNOSIS — F32A Depression, unspecified: Secondary | ICD-10-CM

## 2018-10-21 DIAGNOSIS — Z7901 Long term (current) use of anticoagulants: Secondary | ICD-10-CM

## 2018-10-21 DIAGNOSIS — F419 Anxiety disorder, unspecified: Secondary | ICD-10-CM | POA: Insufficient documentation

## 2018-10-21 DIAGNOSIS — I10 Essential (primary) hypertension: Secondary | ICD-10-CM | POA: Diagnosis not present

## 2018-10-21 DIAGNOSIS — F329 Major depressive disorder, single episode, unspecified: Secondary | ICD-10-CM | POA: Diagnosis not present

## 2018-10-21 MED ORDER — LOSARTAN POTASSIUM 25 MG PO TABS: 25.0000 mg | ORAL_TABLET | Freq: Every day | ORAL | 11 refills | Status: DC

## 2018-10-21 MED ORDER — HYDRALAZINE HCL 25 MG PO TABS: 25.0000 mg | ORAL_TABLET | Freq: Two times a day (BID) | ORAL | 2 refills | Status: DC

## 2018-10-21 MED ORDER — OXYCODONE-ACETAMINOPHEN 10-325 MG PO TABS: 1.0000 | ORAL_TABLET | Freq: Three times a day (TID) | ORAL | 0 refills | Status: DC | PRN

## 2018-10-21 NOTE — Telephone Encounter (Signed)
REFILL REQUEST  oxyCODONE-acetaminophen (PERCOCET) 10-325 MG tablet(Expired)  [id number] - [place], [place] - [address] [phone] (Phone) [phone] (Fax)

## 2018-10-21 NOTE — Assessment & Plan Note (Addendum)
Had a sleep study several years ago but has been greater than 5 years.  She was originally on a CPAP but had a difficult time with the mask.  She states that it was not comfortable to wear.  Likely that her sleep apnea is contributing to her resistant hypertension and poor sleep.  She admits to headaches in the morning that take about 2 hours to resolve which could be related to her sleep apnea.   Plan: - I will put in referral for a another sleep study. - I counseled her that the masks have improved in the last 5 years.

## 2018-10-21 NOTE — Patient Instructions (Signed)
Thank you, Ms.Hyman Hopes for allowing [name] to provide your care today. Today we discussed depression and hypertension.    I have ordered a basic metabolic panel labs for you. I will call if any are abnormal.    I have place a referrals to Sleep study and behavioral health   I have ordered the following tests: Sleep study  We changes the following medications: Today I am going to add hydralazine 25 mg please take this twice a day.  I am also adding losartan 25 mg to be taken once a day.  Please follow-up in please follow-up in 1 month..    Should you have any questions or concerns please call the internal medicine clinic at [phone].    Marianna Payment, D.O. [facility]

## 2018-10-21 NOTE — Telephone Encounter (Signed)
Last rx written 09/19/18. Last OV 10/21/18 in [facility]. Next OV 11/21/18. UDS  12/04/17.

## 2018-10-21 NOTE — Assessment & Plan Note (Addendum)
Patient endorses feeling anxious particularly due to her current living situation as well as the current pandemic.  It is difficult to assess whether or not she feels safe in her apartment complex.  She does state that there is a lot of people in the apartment complex and there is only one elevator and that makes it difficult for her to get where she is going on time.  She also states that due to the pandemic she has not been able to do her normal activities such as go to church.  I cannot see in her chart where she has had any formal counseling for anxiety but the patient states that she has been previously set up with a counselor in the past.  Plan: - Set up referral with Dessie Coma for counseling and psychiatric referral.

## 2018-10-22 LAB — BMP8+ANION GAP
Anion Gap: 16 mmol/L (ref 10.0–18.0)
BUN/Creatinine Ratio: 17 (ref 12–28)
BUN: 21 mg/dL (ref 8–27)
CO2: 22 mmol/L (ref 20–29)
Calcium: 8.4 mg/dL — ABNORMAL LOW (ref 8.7–10.3)
Chloride: 100 mmol/L (ref 96–106)
Creatinine, Ser: 1.27 mg/dL — ABNORMAL HIGH (ref 0.57–1.00)
GFR calc Af Amer: 51 mL/min/{1.73_m2} — ABNORMAL LOW (ref >59)
GFR calc non Af Amer: 44 mL/min/{1.73_m2} — ABNORMAL LOW (ref >59)
Glucose: 102 mg/dL — ABNORMAL HIGH (ref 65–99)
Potassium: 4 mmol/L (ref 3.5–5.2)
Sodium: 138 mmol/L (ref 134–144)

## 2018-10-22 NOTE — Assessment & Plan Note (Signed)
Patient has severely elevated blood pressure without symptoms.  Today her blood pressure is 174/92.  She is currently taking amlodipine 10 mg.  Considering the patient is currently on Xarelto, it is important to treat her hypertension aggressively at this time to decrease her risk of cerebrovascular accident.   Plan: - I will prescribe hydralazine 25 mg twice daily I will prescribe losartan 25 mg daily. Patient previously had a reaction to lisinopril.  She states that she had angioedema.  I told her to call us if she has any signs or symptoms with losartan.

## 2018-10-22 NOTE — Progress Notes (Signed)
   CC: Hypertension  HPI:  Vanessa Palmer is a 68 y.o. female with a past medical history stated below and presents today for hypertension. Please see problem based assessment and plan for additional details.   Past Medical History:  Diagnosis Date  . ALCOHOL ABUSE 12/13/2005   Annotation: Sober since 11/06 Qualifier: Diagnosis of  By: Pearline Cables MD, Belenda Cruise    . Anemia   . Arthritis    "all over" (02/21/2017)  . CAD (coronary artery disease)    s/p non-Q wave MI in 06/2001 and 2004, 2005  . CHF (congestive heart failure) ([place])   . Chronic kidney disease (CKD), stage III (moderate) ([place]) 09/19/2010  . Chronic mid back pain   . Depression   . DJD (degenerative joint disease)   . DVT (deep venous thrombosis) (HCC)    BLE  . DVT of lower extremity, bilateral ([place]) 04/04/2012   On Xarelto    . GERD (gastroesophageal reflux disease)   . Gout   . Hyperlipidemia   . Hypertension   . INTRINSIC ASTHMA, WITH EXACERBATION 09/27/2009   Qualifier: Diagnosis of  By: Stanford Scotland MD, Jiles Garter    . Migraine    "none anymore" (02/21/2017)  . Myocardial infarction [facility]) ?2005  . Obesity   . OSA (obstructive sleep apnea)    previously used CPAP, has lost it (02/21/2017)  . Seizures ([place])    As a teenager      Review of Systems: Review of Systems  Constitutional: Positive for malaise/fatigue. Negative for chills and fever.  Eyes: Negative for double vision and photophobia.  Respiratory: Negative for cough and shortness of breath.   Cardiovascular: Negative for chest pain, palpitations, orthopnea and leg swelling.  Neurological: Positive for headaches. Negative for dizziness, focal weakness and weakness.  Psychiatric/Behavioral: Positive for depression. The patient is nervous/anxious.      Vitals:   10/21/18 0908  BP: (!) 174/92  Pulse: (!) 58  Temp: 98.4 F (36.9 C)  TempSrc: Oral  SpO2: 100%  Weight: 264 lb 3.2 oz (119.8 kg)     Physical Exam: Physical Exam  Constitutional: She  is oriented to person, place, and time and well-developed, well-nourished, and in no distress.  Cardiovascular: Normal rate, regular rhythm, normal heart sounds and intact distal pulses. Exam reveals no gallop and no friction rub.  No murmur heard. Pulmonary/Chest: Effort normal. She has no wheezes.  Musculoskeletal:        General: No edema.  Neurological: She is alert and oriented to person, place, and time.  Skin: Skin is warm and dry.     Assessment & Plan:   See Encounters Tab for problem based charting.  Patient seen with Dr. Evette Doffing

## 2018-10-22 NOTE — Progress Notes (Signed)
Internal Medicine Clinic Attending  I saw and evaluated the patient.  I personally confirmed the key portions of the history and exam documented by Dr. Coe and I reviewed pertinent patient test results.  The assessment, diagnosis, and plan were formulated together and I agree with the documentation in the resident's note.    

## 2018-10-27 ENCOUNTER — Encounter: Payer: Self-pay | Admitting: Internal Medicine

## 2018-10-29 ENCOUNTER — Telehealth: Payer: Self-pay | Admitting: *Deleted

## 2018-10-29 NOTE — Telephone Encounter (Signed)
Found knot in L breast, bruised, appr size of .25 cent. Bruise on

## 2018-10-29 NOTE — Telephone Encounter (Signed)
Can you get her scheduled to [facility] to come in for an exam?

## 2018-10-30 ENCOUNTER — Other Ambulatory Visit: Payer: Self-pay

## 2018-10-30 ENCOUNTER — Encounter: Payer: Self-pay | Admitting: Internal Medicine

## 2018-10-30 ENCOUNTER — Ambulatory Visit (INDEPENDENT_AMBULATORY_CARE_PROVIDER_SITE_OTHER): Payer: Medicare Other | Admitting: Internal Medicine

## 2018-10-30 VITALS — BP 137/82 | HR 67 | Temp 98.5°F | Ht 62.0 in | Wt 259.4 lb

## 2018-10-30 DIAGNOSIS — I10 Essential (primary) hypertension: Secondary | ICD-10-CM

## 2018-10-30 DIAGNOSIS — M109 Gout, unspecified: Secondary | ICD-10-CM

## 2018-10-30 DIAGNOSIS — I2510 Atherosclerotic heart disease of native coronary artery without angina pectoris: Secondary | ICD-10-CM | POA: Diagnosis not present

## 2018-10-30 DIAGNOSIS — R21 Rash and other nonspecific skin eruption: Secondary | ICD-10-CM | POA: Diagnosis not present

## 2018-10-30 DIAGNOSIS — Z23 Encounter for immunization: Secondary | ICD-10-CM

## 2018-10-30 DIAGNOSIS — R58 Hemorrhage, not elsewhere classified: Secondary | ICD-10-CM | POA: Diagnosis not present

## 2018-10-30 DIAGNOSIS — F329 Major depressive disorder, single episode, unspecified: Secondary | ICD-10-CM

## 2018-10-30 LAB — PROTIME-INR
INR: 2 — ABNORMAL HIGH (ref 0.8–1.2)
Prothrombin Time: 22.2 seconds — ABNORMAL HIGH (ref 11.4–15.2)

## 2018-10-30 LAB — CBC
HCT: 38.7 % (ref 36.0–46.0)
Hemoglobin: 13 g/dL (ref 12.0–15.0)
MCH: 31.6 pg (ref 26.0–34.0)
MCHC: 33.6 g/dL (ref 30.0–36.0)
MCV: 94.2 fL (ref 80.0–100.0)
Platelets: 192 10*3/uL (ref 150–400)
RBC: 4.11 MIL/uL (ref 3.87–5.11)
RDW: 13.1 % (ref 11.5–15.5)
WBC: 4.4 10*3/uL (ref 4.0–10.5)
nRBC: 0 % (ref 0.0–0.2)

## 2018-10-30 NOTE — Assessment & Plan Note (Addendum)
Patient stated that she noticed a hyperpigmented area on the medial aspect of left thigh and on left breast 5 days ago. She subsequently noticed another area on the anterior aspect of her right thigh today.   The rash is pruritic, non-tender, and increasing in size. No changes in detergents or lotions, no recent trauma. She has recently been started on hydralazine and losartan one week ago, but does not appear to be a drug rash.   Assessment and plan  Patient appears to have areas of ecchymosis. She does not have any bleeding disorder and does not know of any familial bleeding disorders. Most recent cbc in January 2020 showed hb of 11.8, hct 35.9, platelets 185. Will recheck cbc with pt and inr during today's visit.   Recommended patient monitor the areas and return to clinic if she notices any worsening. If there is worsening the patient will need to be worked up for bleeding disorders.  ADDENDUM  Review of patient's medication list revealed that patient is on xarelto 20mg  which is likely what is causing her to bruise more easily.

## 2018-10-30 NOTE — Progress Notes (Signed)
   CC: Rash on body  HPI:  [name] is a 68 y.o. with essential hypertension, gout, major depressive disorder, coronary atherosclerosis who presents for rash on her body. Please see problem based charting for evaluation, assessment, and plan.  Past Medical History:  Diagnosis Date  . ALCOHOL ABUSE 12/13/2005   Annotation: Sober since 11/06 Qualifier: Diagnosis of  By: Pearline Cables MD, Belenda Cruise    . Anemia   . Arthritis    "all over" (02/21/2017)  . CAD (coronary artery disease)    s/p non-Q wave MI in 06/2001 and 2004, 2005  . CHF (congestive heart failure) ([place])   . Chronic kidney disease (CKD), stage III (moderate) ([place]) 09/19/2010  . Chronic mid back pain   . Depression   . DJD (degenerative joint disease)   . DVT (deep venous thrombosis) (HCC)    BLE  . DVT of lower extremity, bilateral ([place]) 04/04/2012   On Xarelto    . GERD (gastroesophageal reflux disease)   . Gout   . Hyperlipidemia   . Hypertension   . INTRINSIC ASTHMA, WITH EXACERBATION 09/27/2009   Qualifier: Diagnosis of  By: Stanford Scotland MD, Jiles Garter    . Migraine    "none anymore" (02/21/2017)  . Myocardial infarction [facility]) ?2005  . Obesity   . OSA (obstructive sleep apnea)    previously used CPAP, has lost it (02/21/2017)  . Seizures ([place])    As a teenager    Review of Systems:    Review of Systems  Constitutional: Negative for chills and fever.  Respiratory: Negative for shortness of breath.   Cardiovascular: Negative for chest pain.  Gastrointestinal: Negative for abdominal pain, nausea and vomiting.  Genitourinary: Negative for dysuria.  Neurological: Negative for dizziness and headaches.   Physical Exam:  Vitals:   10/30/18 0853  BP: (!) 146/89  Pulse: 69  Temp: 98.5 F (36.9 C)  TempSrc: Oral  SpO2: 100%  Weight: 259 lb 6.4 oz (117.7 kg)  Height: 5\' 2"  (1.575 m)   Physical Exam  Constitutional: She is oriented to person, place, and time. She appears well-developed and well-nourished. No  distress.  HENT:  Head: Normocephalic and atraumatic.  Eyes: Conjunctivae are normal.  Cardiovascular: Normal rate, regular rhythm and normal heart sounds.  Respiratory: Effort normal and breath sounds normal. No respiratory distress. She has no wheezes.  GI: Soft. Bowel sounds are normal. She exhibits no distension. There is abdominal tenderness (generalized mild).  Musculoskeletal:        General: No edema.  Neurological: She is alert and oriented to person, place, and time.  Skin: She is not diaphoretic. There is erythema (2-3cm in diameter per lesion).  Macular ares of hyperpigmentation approximately 2-3cm in diameter, irregular borders, nonblanching, pruritic, nontender, not raised  Psychiatric: She has a normal mood and affect. Her behavior is normal. Judgment and thought content normal.       Assessment & Plan:   See Encounters Tab for problem based charting.  Patient discussed with Dr. Philipp Ovens

## 2018-10-30 NOTE — Patient Instructions (Signed)
It was a pleasure to see you today Vanessa Palmer. Please make the following changes:  Your rash appears to be a bruise that should resolve. I have checked bloodwork to day to make sure your blood counts are within normal range. You were given a flu shot today as well.   If you have any questions or concerns, please call our clinic at [phone] between 9am-5pm and after hours call [phone] and ask for the internal medicine resident on call. If you feel you are having a medical emergency please call 911.   Thank you, we look forward to help you remain healthy!  Lars Mage, MD Internal Medicine PGY3

## 2018-10-30 NOTE — Progress Notes (Signed)
Internal Medicine Clinic Attending  Case discussed with Dr. Chundi at the time of the visit.  We reviewed the resident's history and exam and pertinent patient test results.  I agree with the assessment, diagnosis, and plan of care documented in the resident's note. 

## 2018-10-30 NOTE — Addendum Note (Signed)
Addended by: Jodean Lima on: 10/30/2018 10:16 AM   Modules accepted: Level of Service

## 2018-11-04 ENCOUNTER — Telehealth: Payer: Self-pay | Admitting: Licensed Clinical Social Worker

## 2018-11-04 ENCOUNTER — Encounter: Payer: Self-pay | Admitting: Licensed Clinical Social Worker

## 2018-11-04 NOTE — Telephone Encounter (Signed)
Patient called the office back, and is declining any counseling services at this time.

## 2018-11-04 NOTE — Telephone Encounter (Signed)
Patient was contacted to discuss the referral from her doctor (1st attempt). Pt did not answer, and a vm was left for the patient.

## 2018-11-04 NOTE — Addendum Note (Signed)
Addended by: Hulan Fray on: 11/04/2018 03:10 PM   Modules accepted: Orders

## 2018-11-06 DIAGNOSIS — H43811 Vitreous degeneration, right eye: Secondary | ICD-10-CM | POA: Diagnosis not present

## 2018-11-06 DIAGNOSIS — H2513 Age-related nuclear cataract, bilateral: Secondary | ICD-10-CM | POA: Diagnosis not present

## 2018-11-06 DIAGNOSIS — H04123 Dry eye syndrome of bilateral lacrimal glands: Secondary | ICD-10-CM | POA: Diagnosis not present

## 2018-11-12 DIAGNOSIS — M2550 Pain in unspecified joint: Secondary | ICD-10-CM | POA: Diagnosis not present

## 2018-11-12 DIAGNOSIS — G4733 Obstructive sleep apnea (adult) (pediatric): Secondary | ICD-10-CM | POA: Diagnosis not present

## 2018-11-12 DIAGNOSIS — M1990 Unspecified osteoarthritis, unspecified site: Secondary | ICD-10-CM | POA: Diagnosis not present

## 2018-11-21 ENCOUNTER — Ambulatory Visit (INDEPENDENT_AMBULATORY_CARE_PROVIDER_SITE_OTHER): Payer: Medicare Other | Admitting: Internal Medicine

## 2018-11-21 ENCOUNTER — Other Ambulatory Visit: Payer: Self-pay

## 2018-11-21 ENCOUNTER — Encounter: Payer: Self-pay | Admitting: Internal Medicine

## 2018-11-21 VITALS — BP 150/81 | HR 62 | Temp 98.4°F | Ht 62.0 in | Wt 263.7 lb

## 2018-11-21 DIAGNOSIS — G8929 Other chronic pain: Secondary | ICD-10-CM

## 2018-11-21 DIAGNOSIS — I10 Essential (primary) hypertension: Secondary | ICD-10-CM | POA: Diagnosis not present

## 2018-11-21 DIAGNOSIS — I82403 Acute embolism and thrombosis of unspecified deep veins of lower extremity, bilateral: Secondary | ICD-10-CM

## 2018-11-21 DIAGNOSIS — J45909 Unspecified asthma, uncomplicated: Secondary | ICD-10-CM | POA: Diagnosis not present

## 2018-11-21 DIAGNOSIS — M2550 Pain in unspecified joint: Secondary | ICD-10-CM

## 2018-11-21 DIAGNOSIS — N183 Chronic kidney disease, stage 3 unspecified: Secondary | ICD-10-CM

## 2018-11-21 DIAGNOSIS — G4733 Obstructive sleep apnea (adult) (pediatric): Secondary | ICD-10-CM

## 2018-11-21 DIAGNOSIS — R269 Unspecified abnormalities of gait and mobility: Secondary | ICD-10-CM

## 2018-11-21 DIAGNOSIS — G4700 Insomnia, unspecified: Secondary | ICD-10-CM

## 2018-11-21 DIAGNOSIS — Z79899 Other long term (current) drug therapy: Secondary | ICD-10-CM

## 2018-11-21 DIAGNOSIS — R32 Unspecified urinary incontinence: Secondary | ICD-10-CM

## 2018-11-21 DIAGNOSIS — I209 Angina pectoris, unspecified: Secondary | ICD-10-CM | POA: Diagnosis not present

## 2018-11-21 DIAGNOSIS — F419 Anxiety disorder, unspecified: Secondary | ICD-10-CM

## 2018-11-21 DIAGNOSIS — E785 Hyperlipidemia, unspecified: Secondary | ICD-10-CM | POA: Diagnosis not present

## 2018-11-21 DIAGNOSIS — Z7901 Long term (current) use of anticoagulants: Secondary | ICD-10-CM

## 2018-11-21 DIAGNOSIS — E7800 Pure hypercholesterolemia, unspecified: Secondary | ICD-10-CM

## 2018-11-21 DIAGNOSIS — Z86718 Personal history of other venous thrombosis and embolism: Secondary | ICD-10-CM

## 2018-11-21 DIAGNOSIS — M1A9XX0 Chronic gout, unspecified, without tophus (tophi): Secondary | ICD-10-CM

## 2018-11-21 DIAGNOSIS — M109 Gout, unspecified: Secondary | ICD-10-CM

## 2018-11-21 DIAGNOSIS — Z79891 Long term (current) use of opiate analgesic: Secondary | ICD-10-CM

## 2018-11-21 DIAGNOSIS — I129 Hypertensive chronic kidney disease with stage 1 through stage 4 chronic kidney disease, or unspecified chronic kidney disease: Secondary | ICD-10-CM | POA: Diagnosis not present

## 2018-11-21 DIAGNOSIS — F331 Major depressive disorder, recurrent, moderate: Secondary | ICD-10-CM

## 2018-11-21 DIAGNOSIS — M7989 Other specified soft tissue disorders: Secondary | ICD-10-CM

## 2018-11-21 DIAGNOSIS — K5900 Constipation, unspecified: Secondary | ICD-10-CM

## 2018-11-21 DIAGNOSIS — R102 Pelvic and perineal pain: Secondary | ICD-10-CM

## 2018-11-21 DIAGNOSIS — Z0000 Encounter for general adult medical examination without abnormal findings: Secondary | ICD-10-CM

## 2018-11-21 MED ORDER — ZALEPLON 5 MG PO CAPS: 5.0000 mg | ORAL_CAPSULE | Freq: Every evening | ORAL | 0 refills | Status: DC | PRN

## 2018-11-21 MED ORDER — OXYCODONE-ACETAMINOPHEN 10-325 MG PO TABS: 1.0000 | ORAL_TABLET | Freq: Three times a day (TID) | ORAL | 0 refills | Status: DC | PRN

## 2018-11-21 MED ORDER — HYDRALAZINE HCL 50 MG PO TABS: 50.0000 mg | ORAL_TABLET | Freq: Two times a day (BID) | ORAL | 2 refills | Status: DC

## 2018-11-21 NOTE — Progress Notes (Signed)
Subjective:    Patient ID: Vanessa Palmer, female    DOB: [birth year], 68 y.o.   MRN: [id number]  CC: 4 week follow up for elevated BP  HPI  Vanessa Palmer is a 68 year old woman with PMH of HTN, HLD, Gout, MDD, Chronic pain, CKD 3, anxiety and DVT on chronic xarelto who presents for follow up.   She was last seen for Ecchymoses thought to be related to her Xarelto.  Her INR was 2, however, this is variable in the setting of Xarelto use.  She further had a normal H/H.  She was also seen previously by the resident clinic for elevated BP and losartan 25mg  was added to her regimen.   Today her BP is elevated to 150/81.  She notes that it is regularly high at home as well.  She has started the losartan 25mg  daily.  She also takes 4 other medications.  She sometimes has headaches when her BP is high.  Repeat BP was 150/81.  We discussed increasing one of her medications.    Vanessa Palmer reports that she had a fall out of bed.  She was mostly asleep and found herself on the floor.  She is able to walk, had some bruising on her hip which is now gone.  She feels that her exercise tolerance is decreasing during the pandemic as she is not going out much.  She is due to have a sleep study on Monday.  She is having increased depression, pain, sleep loss and higher blood pressure.  I think many of things can be improved with adequate treatment of sleep apnea if she has a positive test.  We discussed this today.   Review of Systems  Constitutional: Positive for activity change. Negative for fatigue and unexpected weight change.  Respiratory: Positive for chest tightness (occasional due to asthma) and shortness of breath (with exercise).   Cardiovascular: Positive for chest pain (mild, improved with rest, has not taken NTG) and leg swelling (chronic, on lasix). Negative for palpitations.  Gastrointestinal: Positive for constipation (occasional, due to opiate and ditropan, takes MOM). Negative for nausea and  rectal pain.  Genitourinary: Negative for decreased urine volume, difficulty urinating, dysuria, enuresis, frequency, pelvic pain and urgency.  Musculoskeletal: Positive for arthralgias, back pain and gait problem (uses cane).  Skin: Negative for color change and wound.  Neurological: Positive for headaches. Negative for dizziness and weakness.  Psychiatric/Behavioral: Positive for dysphoric mood and sleep disturbance. Negative for self-injury and suicidal ideas. The patient is nervous/anxious.        Objective:   Physical Exam Vitals signs and nursing note reviewed.  Constitutional:      General: She is not in acute distress.    Appearance: Normal appearance. She is obese. She is not toxic-appearing.  HENT:     Head: Normocephalic and atraumatic.  Cardiovascular:     Rate and Rhythm: Normal rate and regular rhythm.     Pulses: Normal pulses.     Heart sounds: Normal heart sounds. No murmur.  Pulmonary:     Effort: Pulmonary effort is normal. No respiratory distress.     Breath sounds: Normal breath sounds. No wheezing.  Musculoskeletal:        General: Tenderness (with any palpation, ankles are thin) present. No swelling.     Right lower leg: No edema.     Left lower leg: No edema.  Skin:    General: Skin is warm and dry.     Findings:  No bruising or lesion.  Neurological:     Mental Status: She is alert and oriented to person, place, and time. Mental status is at baseline.  Psychiatric:        Behavior: Behavior normal.        Thought Content: Thought content normal.    BMET today to check on renal function.         Assessment & Plan:  Return to clinic in 4 weeks for BP check - can be seen in [facility]

## 2018-11-21 NOTE — Patient Instructions (Signed)
Vanessa Palmer - -  Your blood pressure is still high.  Please INCREASE your hydralazine to 50mg  twice a day. I sent in a new Prescription of the higher dose.  You can use your current Rx to take 2 tablets twice per day.   Please make sure to go for your sleep study.  I believe this will help your blood pressure, sleep and mood.   I have sent in refills as you requested.   Come back in 4 weeks to check your blood pressure.

## 2018-11-22 LAB — BMP8+ANION GAP
Anion Gap: 15 mmol/L (ref 10.0–18.0)
BUN/Creatinine Ratio: 12 (ref 12–28)
BUN: 19 mg/dL (ref 8–27)
CO2: 21 mmol/L (ref 20–29)
Calcium: 9.2 mg/dL (ref 8.7–10.3)
Chloride: 102 mmol/L (ref 96–106)
Creatinine, Ser: 1.6 mg/dL — ABNORMAL HIGH (ref 0.57–1.00)
GFR calc Af Amer: 39 mL/min/{1.73_m2} — ABNORMAL LOW (ref >59)
GFR calc non Af Amer: 34 mL/min/{1.73_m2} — ABNORMAL LOW (ref >59)
Glucose: 99 mg/dL (ref 65–99)
Potassium: 4.7 mmol/L (ref 3.5–5.2)
Sodium: 138 mmol/L (ref 134–144)

## 2018-11-25 ENCOUNTER — Other Ambulatory Visit: Payer: Self-pay

## 2018-11-25 ENCOUNTER — Ambulatory Visit (INDEPENDENT_AMBULATORY_CARE_PROVIDER_SITE_OTHER): Payer: Medicare Other | Admitting: Neurology

## 2018-11-25 ENCOUNTER — Encounter: Payer: Self-pay | Admitting: Neurology

## 2018-11-25 VITALS — BP 162/96 | HR 59 | Ht 62.0 in | Wt 263.0 lb

## 2018-11-25 DIAGNOSIS — G4719 Other hypersomnia: Secondary | ICD-10-CM | POA: Diagnosis not present

## 2018-11-25 DIAGNOSIS — F1190 Opioid use, unspecified, uncomplicated: Secondary | ICD-10-CM

## 2018-11-25 DIAGNOSIS — G4733 Obstructive sleep apnea (adult) (pediatric): Secondary | ICD-10-CM | POA: Diagnosis not present

## 2018-11-25 DIAGNOSIS — R351 Nocturia: Secondary | ICD-10-CM | POA: Diagnosis not present

## 2018-11-25 DIAGNOSIS — Z6841 Body Mass Index (BMI) 40.0 and over, adult: Secondary | ICD-10-CM

## 2018-11-25 NOTE — Progress Notes (Signed)
Subjective:    Patient ID: Vanessa Palmer is a 68 y.o. female.  HPI     Huston Foley, MD, PhD [facility] [address] [address] [place], [place] [address]   Dear Drs. Rigoberto Noel,   I saw your patient, Vanessa Palmer, upon your kind request to my sleep clinic today for initial consultation of her sleep disorder, in particular, evaluation of her prior diagnosis of obstructive sleep apnea.  The patient is unaccompanied today.  As you know, [name] is a 68 year old right-handed woman with an underlying complex medical history of heart disease including history of MI, history of DVT, degenerative disc disease, depression, chronic kidney disease, CHF, history of alcohol abuse, anemia, arthritis, history of seizure in the remote past, and morbid obesity with a BMI of over 45, who was previously diagnosed with obstructive sleep apnea and placed on CPAP therapy.  She no longer has a CPAP machine.  I reviewed your office note from 10/21/2018. Prior sleep study results are not available for my review today.  Her Epworth sleepiness score is 17 out of 24, fatigue severity score is 60 out of 63.  She is single, she has 2 living children, 10 grandchildren, 12 great grandchildren and one great great grandchild.  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  She lives alone, she has no pets.  She takes care of 1 of her granddaughters during the day, she has to be up at 615 for this.  Bedtime is around 10.  She has a TV in her bedroom but tries to turn it off before falling asleep.  She has difficulty initiating and maintaining sleep.  She has tried trazodone in the past and is currently on data.  She is not aware of any family history of OSA.  She has gained weight since her original sleep study.  She has had morning headaches and has nocturia about 3 times per average night.  Sometimes she takes Tylenol for her morning  headaches and sometimes she uses her Percocet.  She takes it for back pain and bilateral knee pain.  She is a non-smoker, does not drink alcohol daily and drinks caffeine occasionally.  Her Past Medical History Is Significant For: Past Medical History:  Diagnosis Date  . ALCOHOL ABUSE 12/13/2005   Annotation: Sober since 11/06 Qualifier: Diagnosis of  By: Wallace Cullens MD, Natalia Leatherwood    . Anemia   . Arthritis    "all over" (02/21/2017)  . CAD (coronary artery disease)    s/p non-Q wave MI in 06/2001 and 2004, 2005  . CHF (congestive heart failure) (HCC)   . Chronic kidney disease (CKD), stage III (moderate) (HCC) 09/19/2010  . Chronic mid back pain   . Depression   . DJD (degenerative joint disease)   . DVT (deep venous thrombosis) (HCC)    BLE  . DVT of lower extremity, bilateral (HCC) 04/04/2012   On Xarelto    . GERD (gastroesophageal reflux disease)   . Gout   . Hyperlipidemia   . Hypertension   . INTRINSIC ASTHMA, WITH EXACERBATION 09/27/2009   Qualifier: Diagnosis of  By: Denton Meek MD, Tillie Rung    . Migraine    "none anymore" (02/21/2017)  . Myocardial infarction [facility]) ?2005  . Obesity   . OSA (obstructive sleep apnea)    previously used CPAP, has lost it (02/21/2017)  . Seizures (HCC)    As  a teenager     Her Past Surgical History Is Significant For: Past Surgical History:  Procedure Laterality Date  . ABDOMINAL HYSTERECTOMY     partial; due to endometriosis  . CARDIAC CATHETERIZATION  06/2001, 02/2002, 10/2004    (10-15% proximal stenosis of circumflex, diffuse disease of  OM1 and RCA)  . CARDIAC CATHETERIZATION  02/21/2017  . COLONOSCOPY    . JOINT REPLACEMENT    . KNEE ARTHROSCOPY Left   . LEFT HEART CATH AND CORONARY ANGIOGRAPHY N/A 02/21/2017   Procedure: LEFT HEART CATH AND CORONARY ANGIOGRAPHY;  Surgeon: Rinaldo CloudHarwani, Mohan, MD;  Location: [facility] INVASIVE CV LAB;  Service: Cardiovascular;  Laterality: N/A;  . TOTAL HIP ARTHROPLASTY Right 11/23/2014   Procedure: RIGHT TOTAL HIP  ARTHROPLASTY ANTERIOR APPROACH;  Surgeon: Kathryne Hitchhristopher Y Blackman, MD;  Location: [facility] OR;  Service: Orthopedics;  Laterality: Right;  . TUBAL LIGATION      Her Family History Is Significant For: Family History  Problem Relation Age of Onset  . Mental illness Mother   . Heart disease Father   . Diabetes Sister   . Diabetes Sister   . Diabetes Sister     Her Social History Is Significant For: Social History   Socioeconomic History  . Marital status: Widowed    Spouse name: Not on file  . Number of children: 3  . Years of education: GED  . Highest education level: Not on file  Occupational History  . Occupation: retired    Associate Professormployer: UNEMPLOYED    Comment: previously worked as a Engineer, drillingroom intendent  Social Needs  . Financial resource strain: Not on file  . Food insecurity    Worry: Not on file    Inability: Not on file  . Transportation needs    Medical: Not on file    Non-medical: Not on file  Tobacco Use  . Smoking status: Never Smoker  . Smokeless tobacco: Never Used  Substance and Sexual Activity  . Alcohol use: Yes    Alcohol/week: 12.0 standard drinks    Types: 6 Cans of beer, 6 Shots of liquor per week    Comment: Sometimes.  . Drug use: No  . Sexual activity: Not on file  Lifestyle  . Physical activity    Days per week: Not on file    Minutes per session: Not on file  . Stress: Not on file  Relationships  . Social Musicianconnections    Talks on phone: Not on file    Gets together: Not on file    Attends religious service: Not on file    Active member of club or organization: Not on file    Attends meetings of clubs or organizations: Not on file    Relationship status: Not on file  Other Topics Concern  . Not on file  Social History Narrative   [place] in [place].       Her Allergies Are:  Allergies  Allergen Reactions  . Ace Inhibitors Swelling and Other (See Comments)     Angioedema   . Regadenoson Hives    LEXISCAN  . Shrimp [Shellfish  Allergy] Anaphylaxis, Shortness Of Breath and Rash    Had to be taken to the ED when she was exposed to this  . Peanut Oil Itching, Rash and Other (See Comments)    Welts, also  . Hydrocodone-Acetaminophen Itching  . Hydromorphone Hcl Rash and Other (See Comments)    Diffuse rash  . Hydromorphone Hcl Rash and Other (See Comments)  Diffuse rash  . Oxycodone-Acetaminophen Itching    Tolerates with Benadryl  . Propoxyphene N-Acetaminophen Itching  . Vicodin [Hydrocodone-Acetaminophen] Itching  :   Her Current Medications Are:  Outpatient Encounter Medications as of 11/25/2018  Medication Sig  . acetaminophen (TYLENOL) 500 MG tablet Take 500-1,000 mg by mouth every 6 (six) hours as needed (for pain).  Marland Kitchen amLODipine (NORVASC) 10 MG tablet Take 1 tablet (10 mg total) by mouth daily.  Marland Kitchen atorvastatin (LIPITOR) 40 MG tablet Take 1 tablet (40 mg total) by mouth daily.  Marland Kitchen EPINEPHrine 0.3 mg/0.3 mL IJ SOAJ injection Inject 0.3 mLs (0.3 mg total) into the muscle as needed for anaphylaxis.  Marland Kitchen FLOVENT HFA 44 MCG/ACT inhaler INHALE TWO PUFFS INTO THE LUNGS TWICE A DAY  . furosemide (LASIX) 40 MG tablet Take 1 tablet (40 mg total) by mouth daily.  . hydrALAZINE (APRESOLINE) 50 MG tablet Take 1 tablet (50 mg total) by mouth 2 (two) times daily.  Marland Kitchen losartan (COZAAR) 25 MG tablet Take 1 tablet (25 mg total) by mouth daily.  . metoprolol succinate (TOPROL-XL) 50 MG 24 hr tablet Take 50 mg by mouth daily.  . nitroGLYCERIN (NITROSTAT) 0.4 MG SL tablet PLACE ONE TABLET UNDER THE TONGUE EVERY FIVE MINUTES AS NEEDED FOR CHEST PAIN  . omeprazole (PRILOSEC) 20 MG capsule TAKE 1 CAPSULE (20 MG TOTAL) BY MOUTH DAILY.  Marland Kitchen oxybutynin (DITROPAN) 5 MG tablet TAKE ONE TABLET BY MOUTH TWICE A DAY  . oxyCODONE-acetaminophen (PERCOCET) 10-325 MG tablet Take 1 tablet by mouth every 8 (eight) hours as needed for pain (chronic arthritis pain, multiple sites, diag code M16.11, M25.50).  Marland Kitchen sertraline (ZOLOFT) 100 MG tablet Take  1 tablet (100 mg total) by mouth at bedtime.  Carlena Hurl 20 MG TABS tablet TAKE ONE TABLET BY MOUTH DAILY WITH SUPPER  . zaleplon (SONATA) 5 MG capsule Take 1 capsule (5 mg total) by mouth at bedtime as needed for sleep.  . [DISCONTINUED] furosemide (LASIX) 20 MG tablet Take one half of the tablet once daily for 5 days. Then stop   No facility-administered encounter medications on file as of 11/25/2018.   :  Review of Systems:  Out of a complete 14 point review of systems, all are reviewed and negative with the exception of these symptoms as listed below: Review of Systems  Neurological:       Pt presents today to discuss her sleep. Pt has had a sleep study in the past. She was on a cpap but she moved and it got lost. Pt does not know if she snores.  Epworth Sleepiness Scale 0= would never doze 1= slight chance of dozing 2= moderate chance of dozing 3= high chance of dozing  Sitting and reading: 2 Watching TV: 3 Sitting inactive in a public place (ex. Theater or meeting): 2 As a passenger in a car for an hour without a break: 3 Lying down to rest in the afternoon: 2 Sitting and talking to someone: 1 Sitting quietly after lunch (no alcohol): 2 In a car, while stopped in traffic: 2 Total: 17     Objective:  Neurological Exam  Physical Exam Physical Examination:   Vitals:   11/25/18 1608  BP: (!) 162/96  Pulse: (!) 59    General Examination: The patient is a very pleasant 68 y.o. female in no acute distress. She appears well-developed and well-nourished and adequately groomed.   HEENT: Normocephalic, atraumatic, pupils are equal, round and reactive to light. She has corrective eyeglasses.  Hearing is grossly intact.  Face is symmetric with normal facial animation.  Airway examination reveals edentulous state on top without partials of full dentures.  She has multiple missing teeth on the bottom.  She has moderate airway crowding secondary to wider and larger tongue,  Mallampati is class II.  Tonsils are on the smaller side, about 1+ bilaterally.  Uvula is in normal size.  Neck circumference is 15-1/2 inches. Tongue protrudes centrally and palate elevates symmetrically.   Chest: Clear to auscultation without wheezing, rhonchi or crackles noted.  Heart: S1+S2+0, regular and normal without murmurs, rubs or gallops noted.   Abdomen: Soft, non-tender and non-distended with normal bowel sounds appreciated on auscultation.  Extremities: There is no pitting edema in the distal lower extremities bilaterally.  Skin: Warm and dry without trophic changes noted.  Musculoskeletal: exam reveals no obvious joint deformities, tenderness or joint swelling or erythema.   Neurologically:  Mental status: The patient is awake, alert and oriented in all 4 spheres. Her immediate and remote memory, attention, language skills and fund of knowledge are appropriate. There is no evidence of aphasia, agnosia, apraxia or anomia. Speech is clear with normal prosody and enunciation. Thought process is linear. Mood is normal and affect is normal.  Cranial nerves II - XII are as described above under HEENT exam.  Motor exam: Normal bulk, strength and tone is noted. There is no tremor. Fine motor skills and coordination: grossly intact.  Cerebellar testing: No dysmetria or intention tremor.  Sensory exam: intact to light touch in the upper and lower extremities.  Gait, station and balance: She stands With mild difficulty and walks slowly. .             Assessment and Plan:   In summary, Wateen D Gerhart is a very pleasant 68 y.o.-year old female with an underlying complex medical history of heart disease including history of MI, history of DVT, degenerative disc disease, depression, chronic kidney disease, CHF, history of alcohol abuse, anemia, arthritis, history of seizure in the remote past, and morbid obesity with a BMI of over 39, who Presents for evaluation of her prior diagnosis of  obstructive sleep apnea.  She would be willing to get retested and consider CPAP therapy again.  I talked to her about the sleep apnea diagnosis, its prognosis and treatment options. I explained in particular the risks and ramifications of untreated moderate to severe OSA, especially with respect to developing cardiovascular disease down the Road, including congestive heart failure, difficult to treat hypertension, cardiac arrhythmias, or stroke. Even type 2 diabetes has, in part, been linked to untreated OSA. Symptoms of untreated OSA include daytime sleepiness, memory problems, mood irritability and mood disorder such as depression and anxiety, lack of energy, as well as recurrent headaches, especially morning headaches. We talked about trying to maintain a healthy lifestyle in general, as well as the importance of weight control. We also talked about the importance of good sleep hygiene. I recommended the following at this time: sleep study.   I explained the sleep test procedure to the patient and also outlined possible surgical and non-surgical treatment options of OSA, including the use of a custom-made dental device (which would require a referral to a specialist dentist or oral surgeon), upper airway surgical options, such as traditional UPPP or a novel less invasive surgical option in the form of Inspire hypoglossal nerve stimulation (which would involve a referral to an ENT surgeon). I also explained the CPAP treatment option to the patient,  who indicated that she would be willing to try CPAP if the need arises. I explained the importance of being compliant with PAP treatment, not only for insurance purposes but primarily to improve Her symptoms, and for the patient's long term health benefit, including to reduce Her cardiovascular risks. I answered all her questions today and the patient was in agreement. I plan to see her back after the sleep study is completed and encouraged her to call with any  interim questions, concerns, problems or updates.   Thank you very much for allowing me to participate in the care of this nice patient. If I can be of any further assistance to you please do not hesitate to call me at [phone].  Sincerely,   Huston Foley, MD, PhD

## 2018-11-25 NOTE — Patient Instructions (Signed)

## 2018-11-26 NOTE — Assessment & Plan Note (Signed)
She is a never smoker.  No need for lung cancer screening.

## 2018-11-26 NOTE — Assessment & Plan Note (Signed)
Patient reports no chest pain today, no need to take nitroglycerin since last issue with this.

## 2018-11-26 NOTE — Assessment & Plan Note (Signed)
She is due to have her sleep study.   I stressed the importance of this study.  I think with adequate treatment of her OSA she would have improved BP, fatigue and headaches.  She was previously on CPAP but something happened to her machine.   Follow up results of sleep study.

## 2018-11-26 NOTE — Assessment & Plan Note (Signed)
She is on a chronic and stable dose of narcotics.  This dose helps her to get around her house and walk with a cane, also care for her grandchildren.  She has occasional constipation for which she takes milk of magnesia with good results.  No abdominal pain today.  Her MED is 45.  She has not had oversedation with this dose of medication.   Plan Refill Oxycodone 10/325 TID # 90 X 1 today PDMP reviewed and appropriate.

## 2018-11-26 NOTE — Assessment & Plan Note (Signed)
BP is elevated today.  She was initiated on losartan and has been tolerating this.  She notes that she has had no swelling, no rash, no change in urination.  She does get headaches when her BP is elevated.  No headache today.   Plan Increase hydralazine to 50mg  BID Continue amlodipine, losartan, lasix, metoprolol BMET today for renal function showed an increase to 1.6.  This is within range of what we would expect for initiation of an ARB.  However, when we have her come back, would like to check renal function again.

## 2018-11-26 NOTE — Assessment & Plan Note (Signed)
She is taking xarelto without issue.  Ecchymoses which she was concerned about previously have improved.

## 2018-11-26 NOTE — Assessment & Plan Note (Signed)
She notes no episodes for years.  She is on no controlling medications at this time.

## 2018-11-26 NOTE — Assessment & Plan Note (Signed)
Reports that this issue is well controlled with oxybutynin.  She did have a recent fall, but no reported orthostasis.   Continue oxybutynin.

## 2018-11-26 NOTE — Assessment & Plan Note (Signed)
She has an elevated PHQ-9 today.  She mostly complains of issues sleeping.  She lost her bottle of zaleplon and did not get to try it.  I strongly encouraged her to get to her sleep study as treating this issue will likely help with insomnia as well. .  Continue sertraline at current dose Trial of Zaleplon

## 2018-11-26 NOTE — Assessment & Plan Note (Signed)
LDL 73 at last check.  On atorvastatin which will be continued.

## 2018-11-26 NOTE — Assessment & Plan Note (Signed)
Currently without issue.  She only has occasional cramping.  No longer following with urology.

## 2018-11-27 ENCOUNTER — Telehealth: Payer: Self-pay | Admitting: *Deleted

## 2018-11-27 NOTE — Telephone Encounter (Signed)
Attempted to call patient and got voicemail.   Vanessa Palmer - if she calls back, can you tell her that her blood work looks okay.  Her kidney function changed a little when she started Losartan, but we will check again at next visit with me.    Thank you!

## 2018-11-27 NOTE — Telephone Encounter (Signed)
Called pt - stated she called yesterday but no one answered the phone. Stated Dr Daryll Drown can call her back at number on chart -  [phone].

## 2018-11-27 NOTE — Telephone Encounter (Signed)
-----   Message from Sid Falcon, MD sent at 11/26/2018  3:53 PM EDT ----- Called patient, no answer, LVM to call back.

## 2018-11-28 ENCOUNTER — Other Ambulatory Visit: Payer: Self-pay

## 2018-11-28 ENCOUNTER — Emergency Department (HOSPITAL_COMMUNITY): Payer: Medicare Other

## 2018-11-28 ENCOUNTER — Encounter (HOSPITAL_COMMUNITY): Payer: Self-pay | Admitting: *Deleted

## 2018-11-28 ENCOUNTER — Emergency Department (HOSPITAL_COMMUNITY)
Admission: EM | Admit: 2018-11-28 | Discharge: 2018-11-29 | Disposition: A | Discharge status: Home / Self Care | Payer: Medicare Other | Attending: Emergency Medicine | Admitting: Emergency Medicine

## 2018-11-28 DIAGNOSIS — Y908 Blood alcohol level of 240 mg/100 ml or more: Secondary | ICD-10-CM | POA: Insufficient documentation

## 2018-11-28 DIAGNOSIS — I252 Old myocardial infarction: Secondary | ICD-10-CM | POA: Insufficient documentation

## 2018-11-28 DIAGNOSIS — I2510 Atherosclerotic heart disease of native coronary artery without angina pectoris: Secondary | ICD-10-CM | POA: Diagnosis not present

## 2018-11-28 DIAGNOSIS — R58 Hemorrhage, not elsewhere classified: Secondary | ICD-10-CM | POA: Diagnosis not present

## 2018-11-28 DIAGNOSIS — R404 Transient alteration of awareness: Secondary | ICD-10-CM | POA: Diagnosis not present

## 2018-11-28 DIAGNOSIS — R4701 Aphasia: Secondary | ICD-10-CM | POA: Diagnosis not present

## 2018-11-28 DIAGNOSIS — R29818 Other symptoms and signs involving the nervous system: Secondary | ICD-10-CM | POA: Diagnosis not present

## 2018-11-28 DIAGNOSIS — N183 Chronic kidney disease, stage 3 unspecified: Secondary | ICD-10-CM | POA: Insufficient documentation

## 2018-11-28 DIAGNOSIS — R000 Tachycardia, unspecified: Secondary | ICD-10-CM | POA: Diagnosis not present

## 2018-11-28 DIAGNOSIS — Z96641 Presence of right artificial hip joint: Secondary | ICD-10-CM | POA: Diagnosis not present

## 2018-11-28 DIAGNOSIS — I10 Essential (primary) hypertension: Secondary | ICD-10-CM | POA: Diagnosis not present

## 2018-11-28 DIAGNOSIS — I129 Hypertensive chronic kidney disease with stage 1 through stage 4 chronic kidney disease, or unspecified chronic kidney disease: Secondary | ICD-10-CM | POA: Diagnosis not present

## 2018-11-28 DIAGNOSIS — R4182 Altered mental status, unspecified: Secondary | ICD-10-CM | POA: Diagnosis present

## 2018-11-28 DIAGNOSIS — F10920 Alcohol use, unspecified with intoxication, uncomplicated: Secondary | ICD-10-CM

## 2018-11-28 DIAGNOSIS — Z79899 Other long term (current) drug therapy: Secondary | ICD-10-CM | POA: Insufficient documentation

## 2018-11-28 DIAGNOSIS — Z91010 Allergy to peanuts: Secondary | ICD-10-CM | POA: Insufficient documentation

## 2018-11-28 DIAGNOSIS — R4020 Unspecified coma: Secondary | ICD-10-CM | POA: Diagnosis not present

## 2018-11-28 DIAGNOSIS — R4781 Slurred speech: Secondary | ICD-10-CM | POA: Diagnosis not present

## 2018-11-28 DIAGNOSIS — R1084 Generalized abdominal pain: Secondary | ICD-10-CM | POA: Diagnosis not present

## 2018-11-28 LAB — COMPREHENSIVE METABOLIC PANEL
ALT: 17 U/L (ref 0–44)
AST: 28 U/L (ref 15–41)
Albumin: 3.7 g/dL (ref 3.5–5.0)
Alkaline Phosphatase: 152 U/L — ABNORMAL HIGH (ref 38–126)
Anion gap: 13 (ref 5–15)
BUN: 25 mg/dL — ABNORMAL HIGH (ref 8–23)
CO2: 20 mmol/L — ABNORMAL LOW (ref 22–32)
Calcium: 8.2 mg/dL — ABNORMAL LOW (ref 8.9–10.3)
Chloride: 98 mmol/L (ref 98–111)
Creatinine, Ser: 1.49 mg/dL — ABNORMAL HIGH (ref 0.44–1.00)
GFR calc Af Amer: 42 mL/min — ABNORMAL LOW (ref >60)
GFR calc non Af Amer: 36 mL/min — ABNORMAL LOW (ref >60)
Glucose, Bld: 125 mg/dL — ABNORMAL HIGH (ref 70–99)
Potassium: 3.8 mmol/L (ref 3.5–5.1)
Sodium: 131 mmol/L — ABNORMAL LOW (ref 135–145)
Total Bilirubin: 0.9 mg/dL (ref 0.3–1.2)
Total Protein: 7.9 g/dL (ref 6.5–8.1)

## 2018-11-28 LAB — I-STAT CHEM 8, ED
BUN: 30 mg/dL — ABNORMAL HIGH (ref 8–23)
Calcium, Ion: 1.02 mmol/L — ABNORMAL LOW (ref 1.15–1.40)
Chloride: 100 mmol/L (ref 98–111)
Creatinine, Ser: 1.8 mg/dL — ABNORMAL HIGH (ref 0.44–1.00)
Glucose, Bld: 119 mg/dL — ABNORMAL HIGH (ref 70–99)
HCT: 40 % (ref 36.0–46.0)
Hemoglobin: 13.6 g/dL (ref 12.0–15.0)
Potassium: 3.9 mmol/L (ref 3.5–5.1)
Sodium: 135 mmol/L (ref 135–145)
TCO2: 22 mmol/L (ref 22–32)

## 2018-11-28 LAB — DIFFERENTIAL
Abs Immature Granulocytes: 0.03 10*3/uL (ref 0.00–0.07)
Basophils Absolute: 0.1 10*3/uL (ref 0.0–0.1)
Basophils Relative: 1 %
Eosinophils Absolute: 0.2 10*3/uL (ref 0.0–0.5)
Eosinophils Relative: 2 %
Immature Granulocytes: 0 %
Lymphocytes Relative: 46 %
Lymphs Abs: 3.7 10*3/uL (ref 0.7–4.0)
Monocytes Absolute: 0.6 10*3/uL (ref 0.1–1.0)
Monocytes Relative: 7 %
Neutro Abs: 3.6 10*3/uL (ref 1.7–7.7)
Neutrophils Relative %: 44 %

## 2018-11-28 LAB — CBC
HCT: 38 % (ref 36.0–46.0)
Hemoglobin: 12.9 g/dL (ref 12.0–15.0)
MCH: 32.1 pg (ref 26.0–34.0)
MCHC: 33.9 g/dL (ref 30.0–36.0)
MCV: 94.5 fL (ref 80.0–100.0)
Platelets: 223 10*3/uL (ref 150–400)
RBC: 4.02 MIL/uL (ref 3.87–5.11)
RDW: 13 % (ref 11.5–15.5)
WBC: 8.1 10*3/uL (ref 4.0–10.5)
nRBC: 0 % (ref 0.0–0.2)

## 2018-11-28 LAB — APTT: aPTT: 42 seconds — ABNORMAL HIGH (ref 24–36)

## 2018-11-28 LAB — PROTIME-INR
INR: 1.9 — ABNORMAL HIGH (ref 0.8–1.2)
Prothrombin Time: 21.8 seconds — ABNORMAL HIGH (ref 11.4–15.2)

## 2018-11-28 LAB — ETHANOL: Alcohol, Ethyl (B): 290 mg/dL — ABNORMAL HIGH (ref <10)

## 2018-11-28 MED ORDER — ONDANSETRON HCL 4 MG/2ML IJ SOLN
INTRAMUSCULAR | Status: AC
  Administered 2018-11-28: 22:00:00 4 mg
  Administered 2018-11-28 – 2018-11-29 (×2): 4 mg via INTRAVENOUS
  Filled 2018-11-28: qty 2

## 2018-11-28 MED ORDER — LORAZEPAM 2 MG/ML IJ SOLN
1.0000 mg | Freq: Once | INTRAMUSCULAR | Status: DC
  Filled 2018-11-28: qty 1

## 2018-11-28 NOTE — ED Provider Notes (Signed)
[facility] Provider Note   CSN: [id number] Arrival date & time: 11/28/18  2151     History   Chief Complaint Chief Complaint  Patient presents with  . Altered Mental Status  . Aphasia    HPI Vanessa Palmer is a 68 y.o. female.     HPI  The patient [facility]. She was reportedly altered at a friends house at 2230 with noticeable slurred speech. On arrival of EMS, the patient was not following commands, vomiting everywhere. [facility], the patient was agitated, vomiting profusely, not following commands with slurred speech. A CODE STROKE was initially activated. The patient was taken to the CT scanner and neurology was present bedside. Following CTH, the patient was taken to her room where she admitted to drinking alcohol tonight. She denies any drug use. She complains of mild diffuse abdominal pain.   Past Medical History:  Diagnosis Date  . ALCOHOL ABUSE 12/13/2005   Annotation: Sober since 11/06 Qualifier: Diagnosis of  By: Wallace CullensGray MD, Natalia LeatherwoodKatherine    . Anemia   . Arthritis    "all over" (02/21/2017)  . CAD (coronary artery disease)    s/p non-Q wave MI in 06/2001 and 2004, 2005  . CHF (congestive heart failure) (HCC)   . Chronic kidney disease (CKD), stage III (moderate) 09/19/2010  . Chronic mid back pain   . Depression   . DJD (degenerative joint disease)   . DVT (deep venous thrombosis) (HCC)    BLE  . DVT of lower extremity, bilateral (HCC) 04/04/2012   On Xarelto    . GERD (gastroesophageal reflux disease)   . Gout   . Hyperlipidemia   . Hypertension   . INTRINSIC ASTHMA, WITH EXACERBATION 09/27/2009   Qualifier: Diagnosis of  By: Denton MeekKarimova MD, Tillie RungNodira    . Migraine    "none anymore" (02/21/2017)  . Myocardial infarction [facility](HCC) ?2005  . Obesity   . OSA (obstructive sleep apnea)    previously used CPAP, has lost it (02/21/2017)  . Seizures (HCC)    As a teenager     Patient Active Problem  List   Diagnosis Date Noted  . Anxiety 10/21/2018  . Shellfish allergy 03/19/2018  . Unilateral primary osteoarthritis, left knee 04/29/2017  . Unilateral primary osteoarthritis, right knee 04/29/2017  . New-onset angina (HCC) 02/21/2017  . LVH (left ventricular hypertrophy) 08/15/2016  . Status post total replacement of right hip 11/23/2014  . Osteoarthritis of right hip 10/13/2014  . Nocturnal leg cramps 01/06/2014  . DVT of lower extremity, bilateral (HCC) 04/04/2012  . Routine health maintenance 01/14/2012  . GERD (gastroesophageal reflux disease) 01/09/2012  . Urinary incontinence 12/31/2011  . Chronic kidney disease (CKD), stage III (moderate) (HCC) 09/19/2010  . Chronic pelvic pain in female 02/03/2010  . Asthma, chronic 09/27/2009  . Elevated alkaline phosphatase level 08/01/2009  . Pain in joint, multiple sites 12/21/2008  . MAJOR DPRSV DISORDER RECURRENT EPISODE MODERATE 09/08/2008  . Gout 07/05/2008  . Hyperlipidemia 05/07/2006  . Coronary atherosclerosis 02/05/2006  . Morbid obesity (HCC) 12/13/2005  . History of alcohol abuse 12/13/2005  . SLEEP APNEA, OBSTRUCTIVE 12/13/2005  . Essential hypertension 12/13/2005    Past Surgical History:  Procedure Laterality Date  . ABDOMINAL HYSTERECTOMY     partial; due to endometriosis  . CARDIAC CATHETERIZATION  06/2001, 02/2002, 10/2004    (10-15% proximal stenosis of circumflex, diffuse disease of  OM1 and RCA)  . CARDIAC CATHETERIZATION  02/21/2017  . COLONOSCOPY    . JOINT REPLACEMENT    . KNEE ARTHROSCOPY Left   . LEFT HEART CATH AND CORONARY ANGIOGRAPHY N/A 02/21/2017   Procedure: LEFT HEART CATH AND CORONARY ANGIOGRAPHY;  Surgeon: Rinaldo Cloud, MD;  [facility] INV[facility]IVE CV LAB;  Service: Cardiovascular;  Laterality: N/A;  . TOTAL HIP ARTHROPLASTY Right 11/23/2014   Procedure: RIGHT TOTAL HIP ARTHROPLASTY ANTERIOR APPROACH;  Surgeon: Kathryne Hitch, MD;  Location: [facility] OR;  Service: Orthopedics;  Laterality:  Right;  . TUBAL LIGATION       OB History   No obstetric history on file.      Home Medications    Prior to Admission medications   Medication Sig Start Date End Date Taking? Authorizing Provider  acetaminophen (TYLENOL) 500 MG tablet Take 500-1,000 mg by mouth every 6 (six) hours as needed (for pain).    [provider]  amLODipine (NORVASC) 10 MG tablet Take 1 tablet (10 mg total) by mouth daily. 08/07/17   Inez Catalina, MD  atorvastatin (LIPITOR) 40 MG tablet Take 1 tablet (40 mg total) by mouth daily. 06/18/18   Inez Catalina, MD  EPINEPHrine 0.3 mg/0.3 mL IJ SOAJ injection Inject 0.3 mLs (0.3 mg total) into the muscle as needed for anaphylaxis. 03/19/18   Inez Catalina, MD  FLOVENT HFA 44 MCG/ACT inhaler INHALE TWO PUFFS INTO THE LUNGS TWICE A DAY 08/05/18   Inez Catalina, MD  furosemide (LASIX) 40 MG tablet Take 1 tablet (40 mg total) by mouth daily. 06/18/18   Inez Catalina, MD  hydrALAZINE (APRESOLINE) 50 MG tablet Take 1 tablet (50 mg total) by mouth 2 (two) times daily. 11/21/18 01/20/19  Inez Catalina, MD  losartan (COZAAR) 25 MG tablet Take 1 tablet (25 mg total) by mouth daily. 10/21/18 10/21/19  Dellia Cloud, MD  metoprolol succinate (TOPROL-XL) 50 MG 24 hr tablet Take 50 mg by mouth daily. 01/21/17   [provider]  nitroGLYCERIN (NITROSTAT) 0.4 MG SL tablet PLACE ONE TABLET UNDER THE TONGUE EVERY FIVE MINUTES AS NEEDED FOR CHEST PAIN 08/19/18   Inez Catalina, MD  omeprazole (PRILOSEC) 20 MG capsule TAKE 1 CAPSULE (20 MG TOTAL) BY MOUTH DAILY. 03/18/18   Inez Catalina, MD  oxybutynin (DITROPAN) 5 MG tablet TAKE ONE TABLET BY MOUTH TWICE A DAY 09/22/18   Inez Catalina, MD  oxyCODONE-acetaminophen (PERCOCET) 10-325 MG tablet Take 1 tablet by mouth every 8 (eight) hours as needed for pain (chronic arthritis pain, multiple sites, diag code M16.11, M25.50). 11/21/18 12/21/18  Inez Catalina, MD  sertraline (ZOLOFT) 100 MG tablet Take 1 tablet (100 mg  total) by mouth at bedtime. 07/30/18   Inez Catalina, MD  XARELTO 20 MG TABS tablet TAKE ONE TABLET BY MOUTH DAILY WITH SUPPER 10/03/18   Inez Catalina, MD  zaleplon (SONATA) 5 MG capsule Take 1 capsule (5 mg total) by mouth at bedtime as needed for sleep. 11/21/18   Inez Catalina, MD  furosemide (LASIX) 20 MG tablet Take one half of the tablet once daily for 5 days. Then stop 09/19/10 11/29/19  Denna Haggard, MD    Family History Family History  Problem Relation Age of Onset  . Mental illness Mother   . Heart disease Father   . Diabetes Sister   . Diabetes Sister   . Diabetes Sister     Social History Social History   Tobacco Use  . Smoking status: Never Smoker  .  Smokeless tobacco: Never Used  Substance Use Topics  . Alcohol use: Yes    Alcohol/week: 12.0 standard drinks    Types: 6 Cans of beer, 6 Shots of liquor per week    Comment: Sometimes.  . Drug use: No     Allergies   Ace inhibitors, Regadenoson, Shrimp [shellfish allergy], Peanut oil, Hydrocodone-acetaminophen, Hydromorphone hcl, Hydromorphone hcl, Oxycodone-acetaminophen, Propoxyphene n-acetaminophen, and Vicodin [hydrocodone-acetaminophen]   Review of Systems Review of Systems  Unable to perform ROS: Mental status change     Physical Exam Updated Vital Signs BP (!) 112/98   Pulse 66   Resp 12   Ht 5\' 2"  (1.575 m)   Wt 119.3 kg   SpO2 94%   BMI 48.10 kg/m   Physical Exam Vitals signs and nursing note reviewed.  Constitutional:      General: She is in acute distress.     Appearance: She is well-developed. She is obese.     Comments: Acutely agitated, intermittently following commands, ABC intact.  HENT:     Head: Normocephalic and atraumatic.  Eyes:     Conjunctiva/sclera: Conjunctivae normal.  Neck:     Musculoskeletal: Neck supple.  Cardiovascular:     Rate and Rhythm: Normal rate and regular rhythm.  Pulmonary:     Effort: Pulmonary effort is normal. No respiratory distress.      Breath sounds: Normal breath sounds.  Abdominal:     Palpations: Abdomen is soft.     Tenderness: There is no abdominal tenderness.  Skin:    General: Skin is warm and dry.  Neurological:     General: No focal deficit present.     Mental Status: She is alert. She is disoriented.     Comments: CN II-XII grossly intact. Moving all four extremities spontaneously. Acutely agitated and confused. Appears intoxicated.      ED Treatments / Results  Labs (all labs ordered are listed, but only abnormal results are displayed) Labs Reviewed  ETHANOL - Abnormal; Notable for the following components:      Result Value   Alcohol, Ethyl (B) 290 (*)    All other components within normal limits  PROTIME-INR - Abnormal; Notable for the following components:   Prothrombin Time 21.8 (*)    INR 1.9 (*)    All other components within normal limits  APTT - Abnormal; Notable for the following components:   aPTT 42 (*)    All other components within normal limits  COMPREHENSIVE METABOLIC PANEL - Abnormal; Notable for the following components:   Sodium 131 (*)    CO2 20 (*)    Glucose, Bld 125 (*)    BUN 25 (*)    Creatinine, Ser 1.49 (*)    Calcium 8.2 (*)    Alkaline Phosphatase 152 (*)    GFR calc non Af Amer 36 (*)    GFR calc Af Amer 42 (*)    All other components within normal limits  I-STAT CHEM 8, ED - Abnormal; Notable for the following components:   BUN 30 (*)    Creatinine, Ser 1.80 (*)    Glucose, Bld 119 (*)    Calcium, Ion 1.02 (*)    All other components within normal limits  CBC  DIFFERENTIAL  RAPID URINE DRUG SCREEN, HOSP PERFORMED  URINALYSIS, ROUTINE W REFLEX MICROSCOPIC    EKG None  Radiology Ct Head Code Stroke Wo Contrast  Result Date: 11/28/2018 CLINICAL DATA:  Code stroke. Focal neuro deficit, less than 6 hours, stroke suspected.  Additional history provided: Unknown last known normal, vomiting, aphasia. EXAM: CT HEAD WITHOUT CONTRAST TECHNIQUE: Contiguous axial  images were obtained from the base of the skull through the vertex without intravenous contrast. COMPARISON:  Head CT 10/03/2011, brain MRI 09/28/2008 FINDINGS: Brain: No evidence of acute intracranial hemorrhage. No demarcated cortical infarction. No evidence of intracranial mass. No midline shift or extra-axial fluid collection. Mild ill-defined hypoattenuation within the cerebral white matter is nonspecific, but consistent with chronic small vessel ischemic disease. Cerebral volume is normal for age. Partially empty sella turcica. Vascular: No hyperdense vessel. Skull: No calvarial fracture or suspicious osseous lesion. Sinuses/Orbits: Rightward gaze deviation. Scattered paranasal sinus mucosal thickening with partial opacification of the inferior right frontal sinus and of bilateral ethmoid air cells. No significant mastoid effusion. ASPECTS [facility] Stroke Program Early CT Score) - Ganglionic level infarction (caudate, lentiform nuclei, internal capsule, insula, M1-M3 cortex): 7 - Supraganglionic infarction (M4-M6 cortex): 3 Total score (0-10 with 10 being normal): 10 IMPRESSION: 1. No CT evidence of acute intracranial abnormality 2. ASPECTS is 10 Electronically Signed   By: Jackey Loge   On: 11/28/2018 22:22    Procedures Procedures (including critical care time)  Medications Ordered in ED Medications  LORazepam (ATIVAN) injection 1 mg (0 mg Intravenous Hold 11/28/18 2215)  ondansetron (ZOFRAN) 4 MG/2ML injection (4 mg  Given 11/28/18 2210)  ondansetron (ZOFRAN) injection 4 mg (4 mg Intravenous Given 11/29/18 0204)     Initial Impression / Assessment and Plan / ED Course  I have reviewed the triage vital signs and the nursing notes.  Pertinent labs & imaging results that were available during my care of the patient were reviewed by me and considered in my medical decision making (see chart for details).  Clinical Course as of Nov 29 243  Fri Nov 28, 2018  2306 Alcohol, Ethyl (B)(!): 290  [JL]  2306 Sodium(!): 131 [JL]  2306 Creatinine(!): 1.49 [JL]  2306 Alkaline Phosphatase(!): 152 [JL]    Clinical Course User Index [JL] Ernie Avena, MD       The patient is a 67 year old female with a hx of EtOH abuse, CAD, CHF, CKD, DVT previously on Xarelto, Obesity who presents with stroke like symptoms. Given slurred speech, altered mental status, and ataxia per EMS, a CODE STROKE was initiated prior to patient arrival. On arrival, neurology was bedside and the patient was taken to the CT Scanner. Last known normal unknown. On arrival the patient was confused and vomiting. CT Head was unremarkable for an acute hemorrhage. Zofran was provided to the patient.   On our assumption of care, the patient appeared visibly intoxicated and was found to have no neurologic deficits. It was noted that the patient smelled of EtOH and she admitted to drinking tonight. EtOH level returned elevated at 290. The patient was notably moving all four extremities and slurring her speech on exam. She complained of mild diffuse abdominal pain. Likely from persistent vomiting tonight. No hematemesis. Her abdomen was soft and non-tender on exam.The patient was placed in an observation status and allowed time to metabolize. Care of the patient was transferred to Dr. Blinda Leatherwood at 2345.  Final Clinical Impressions(s) / ED Diagnoses   Final diagnoses:  Alcoholic intoxication without complication [facility])    ED Discharge Orders    None       Ernie Avena, MD 11/29/18 0245    Lorre Nick, MD 12/01/18 1701

## 2018-11-28 NOTE — ED Provider Notes (Signed)
I saw and evaluated the patient, reviewed the resident's note and I agree with the findings and plan.  EKG:   68 year old female presents as a code stroke initially due to altered mental status.  She admits to drinking alcohol this evening.  Was seen by neurology and head CT was negative.  Patient's alcohol level is 290.  Patient will be allowed to metabolize to freedom   Lacretia Leigh, MD 11/28/18 2254

## 2018-11-28 NOTE — ED Triage Notes (Signed)
Pt arrived by EMS from [facility]. Per EMS, pt showed up at friend's house with AMS at 2230 and slurred speech. On EMS arrival pt not following any commands and started profusely vomiting. Pt very restless and vomiting on arrival.

## 2018-11-28 NOTE — Consult Note (Signed)
Requesting Physician: Dr. Lorre NickAnthony Allen     Chief Complaint: Altered mental status, vomiting  History obtained from: Patient and Chart    HPI:                                                                                                                                       Vanessa Palmer is a 68 y.o. female with past medical history significant for alcohol abuse, coronary artery disease, CHF, chronic kidney disease, DVT previously on Xarelto, morbid obesity, obstructive sleep apnea remote history of seizures as a teenager presents to the emergency department as a code stroke for altered mental status, slurred speech. Last known normal unclear, presented to her friend's house at 10:30 PM confused and having slurred speech.  When EMS was called patient was throwing up very agitated and at times very somnolent. Her blood pressure is elevated was 170 systolic.  On arrival patient was lethargic and vomiting.  Patient was immediately taken to CT scan which was negative for hemorrhage.  She required multiple doses of Zofran to help abate the vomiting.  Patient was very agitated and with slump over to the right side of the bed.  Patient smelled of alcohol and patient admitted to drinking. EtOH level returned at 290.  Patient appeared to be protecting her airway. On reassessing the patient an hour later she is alert oriented and moving both arms and legs equally.    Date last known well: 10.2.20 tPA Given: No, low suspicion for stroke.  Unclear as a normal     Past Medical History:  Diagnosis Date  . ALCOHOL ABUSE 12/13/2005   Annotation: Sober since 11/06 Qualifier: Diagnosis of  By: Wallace CullensGray MD, Natalia LeatherwoodKatherine    . Anemia   . Arthritis    "all over" (02/21/2017)  . CAD (coronary artery disease)    s/p non-Q wave MI in 06/2001 and 2004, 2005  . CHF (congestive heart failure) (HCC)   . Chronic kidney disease (CKD), stage III (moderate) (HCC) 09/19/2010  . Chronic mid back pain   . Depression   .  DJD (degenerative joint disease)   . DVT (deep venous thrombosis) (HCC)    BLE  . DVT of lower extremity, bilateral (HCC) 04/04/2012   On Xarelto    . GERD (gastroesophageal reflux disease)   . Gout   . Hyperlipidemia   . Hypertension   . INTRINSIC ASTHMA, WITH EXACERBATION 09/27/2009   Qualifier: Diagnosis of  By: Denton MeekKarimova MD, Tillie RungNodira    . Migraine    "none anymore" (02/21/2017)  . Myocardial infarction [facility](HCC) ?2005  . Obesity   . OSA (obstructive sleep apnea)    previously used CPAP, has lost it (02/21/2017)  . Seizures (HCC)    As a teenager     Past Surgical History:  Procedure Laterality Date  . ABDOMINAL HYSTERECTOMY     partial; due to endometriosis  . CARDIAC  CATHETERIZATION  06/2001, 02/2002, 10/2004    (10-15% proximal stenosis of circumflex, diffuse disease of  OM1 and RCA)  . CARDIAC CATHETERIZATION  02/21/2017  . COLONOSCOPY    . JOINT REPLACEMENT    . KNEE ARTHROSCOPY Left   . LEFT HEART CATH AND CORONARY ANGIOGRAPHY N/A 02/21/2017   Procedure: LEFT HEART CATH AND CORONARY ANGIOGRAPHY;  Surgeon: Rinaldo Cloud, MD;  Location: [facility] INVASIVE CV LAB;  Service: Cardiovascular;  Laterality: N/A;  . TOTAL HIP ARTHROPLASTY Right 11/23/2014   Procedure: RIGHT TOTAL HIP ARTHROPLASTY ANTERIOR APPROACH;  Surgeon: Kathryne Hitch, MD;  Location: [facility] OR;  Service: Orthopedics;  Laterality: Right;  . TUBAL LIGATION      Family History  Problem Relation Age of Onset  . Mental illness Mother   . Heart disease Father   . Diabetes Sister   . Diabetes Sister   . Diabetes Sister    Social History:  reports that she has never smoked. She has never used smokeless tobacco. She reports current alcohol use of about 12.0 standard drinks of alcohol per week. She reports that she does not use drugs.  Allergies:  Allergies  Allergen Reactions  . Ace Inhibitors Swelling and Other (See Comments)     Angioedema   . Regadenoson Hives    LEXISCAN  . Shrimp [Shellfish Allergy]  Anaphylaxis, Shortness Of Breath and Rash    Had to be taken to the ED when she was exposed to this  . Peanut Oil Itching, Rash and Other (See Comments)    Welts, also  . Hydrocodone-Acetaminophen Itching  . Hydromorphone Hcl Rash and Other (See Comments)    Diffuse rash  . Hydromorphone Hcl Rash and Other (See Comments)    Diffuse rash  . Oxycodone-Acetaminophen Itching    Tolerates with Benadryl  . Propoxyphene N-Acetaminophen Itching  . Vicodin [Hydrocodone-Acetaminophen] Itching    Medications:                                                                                                                        I reviewed home medications   ROS:                                                                                                                                     14 systems reviewed and negative except above    Examination:  General: Appears well-developed.Pia Mau.  Obese Psych: Affect appropriate to situation Eyes: No scleral injection HENT: No OP obstrucion Head: Normocephalic.  Cardiovascular: Normal rate and regular rhythm. Respiratory: Effort normal and breath sounds normal to anterior ascultation GI: Soft.  No distension. There is no tenderness.  Skin: WDI    Neurological Examination Mental Status: Lethargic, but following commands.  Patient is also dysarthric.  Cranial Nerves: II: Visual fields grossly normal,  III,IV, VI: ptosis not present, extra-ocular motions intact bilaterally, pupils equal, round, reactive to light and accommodation V,VII: smile symmetric, facial light touch sensation normal bilaterally VIII: hearing normal bilaterally IX,X: uvula rises symmetrically XI: bilateral shoulder shrug XII: midline tongue extension Motor: Right : Upper extremity   5/5    Left:     Upper extremity   5/5  Lower extremity   4/5     Lower  extremity   4/5 Tone and bulk:normal tone throughout; no atrophy noted Sensory: Pinprick and light touch intact throughout, bilaterally Plantars: Right: downgoing   Left: downgoing Cerebellar: Finger-to-nose ataxia bilaterally      Lab Results: Basic Metabolic Panel: Recent Labs  Lab 11/28/18 2200  NA 135  K 3.9  CL 100  GLUCOSE 119*  BUN 30*  CREATININE 1.80*    CBC: Recent Labs  Lab 11/28/18 2200  WBC 8.1  NEUTROABS 3.6  HGB 12.9  13.6  HCT 38.0  40.0  MCV 94.5  PLT 223    Coagulation Studies: Recent Labs    11/28/18 2200  LABPROT 21.8*  INR 1.9*    Imaging: Ct Head Code Stroke Wo Contrast  Result Date: 11/28/2018 CLINICAL DATA:  Code stroke. Focal neuro deficit, less than 6 hours, stroke suspected. Additional history provided: Unknown last known normal, vomiting, aphasia. EXAM: CT HEAD WITHOUT CONTRAST TECHNIQUE: Contiguous axial images were obtained from the base of the skull through the vertex without intravenous contrast. COMPARISON:  Head CT 10/03/2011, brain MRI 09/28/2008 FINDINGS: Brain: No evidence of acute intracranial hemorrhage. No demarcated cortical infarction. No evidence of intracranial mass. No midline shift or extra-axial fluid collection. Mild ill-defined hypoattenuation within the cerebral white matter is nonspecific, but consistent with chronic small vessel ischemic disease. Cerebral volume is normal for age. Partially empty sella turcica. Vascular: No hyperdense vessel. Skull: No calvarial fracture or suspicious osseous lesion. Sinuses/Orbits: Rightward gaze deviation. Scattered paranasal sinus mucosal thickening with partial opacification of the inferior right frontal sinus and of bilateral ethmoid air cells. No significant mastoid effusion. ASPECTS [facility] Stroke Program Early CT Score) - Ganglionic level infarction (caudate, lentiform nuclei, internal capsule, insula, M1-M3 cortex): 7 - Supraganglionic infarction (M4-M6 cortex): 3 Total  score (0-10 with 10 being normal): 10 IMPRESSION: 1. No CT evidence of acute intracranial abnormality 2. ASPECTS is 10 Electronically Signed   By: Kellie Simmering   On: 11/28/2018 22:22     ASSESSMENT AND PLAN  59 y female with PMH of alcohol abuse and multiple stroke risk factors presents with acute altered mental status and noted to be intoxicated with a EtOH level of 290.  Has been gradually improving and suspect presentation is due to intoxication.   Impression: Alcohol intoxication  Recommendations -Reassess patient after several hours, if not back to her baseline consider MRI brain to rule out acute stroke however low suspicion.  Tareq Dwan [facility] Pager Number [phone]

## 2018-11-29 DIAGNOSIS — F10920 Alcohol use, unspecified with intoxication, uncomplicated: Secondary | ICD-10-CM | POA: Diagnosis not present

## 2018-11-29 MED ORDER — ONDANSETRON HCL 4 MG/2ML IJ SOLN
4.0000 mg | Freq: Once | INTRAMUSCULAR | Status: AC
  Administered 2018-11-28 – 2018-11-29 (×2): 4 mg via INTRAVENOUS
  Filled 2018-11-29: qty 2

## 2018-11-29 NOTE — ED Provider Notes (Signed)
Signout note  68 year old lady presented to ER with slurred speech, code stroke was initially activated, neurology evaluated.  CT head negative.  Patient admitted to large alcohol consumption.  This was verified on blood work.  She has no ongoing neurologic problems.  Plan overnight to metabolize. I evaluated patient this morning. Patient is currently tolerating p.o. without difficulty, ambulating without difficulty.  Believe she is appropriate for discharge home at this time.     Lucrezia Starch, MD 11/29/18 [phone]

## 2018-11-29 NOTE — ED Notes (Signed)
Verbal order Dr. Zenia Resides at bedside

## 2018-11-29 NOTE — ED Notes (Signed)
Verbal order Dr. Zenia Resides 4 mg zofran

## 2018-11-29 NOTE — Discharge Instructions (Addendum)
Recommend stopping alcohol abuse.  Recommend calling your primary doctor for a recheck next week and to discuss options none help with this issue.  If you develop chest pain, abdominal pain, vomiting, or other new concerning symptom recommend return to ER for reassessment.

## 2018-11-29 NOTE — ED Notes (Signed)
Talked to pharmacy concerning 3rd dose of Zofran ordered at this time for nausea.   Per pharmacist Maudie Mercury,  max dose 16 mg, usually given to chemo pt's.

## 2018-12-04 ENCOUNTER — Other Ambulatory Visit: Payer: Self-pay | Admitting: Internal Medicine

## 2018-12-18 ENCOUNTER — Other Ambulatory Visit: Payer: Self-pay

## 2018-12-18 DIAGNOSIS — M2550 Pain in unspecified joint: Secondary | ICD-10-CM

## 2018-12-18 MED ORDER — OXYCODONE-ACETAMINOPHEN 10-325 MG PO TABS: 1.0000 | ORAL_TABLET | Freq: Three times a day (TID) | ORAL | 0 refills | Status: DC | PRN

## 2018-12-18 NOTE — Telephone Encounter (Signed)
oxyCODONE-acetaminophen (PERCOCET) 10-325 MG tablet, refill request @  [id number] - [place], [place] - [address] AT [address] [phone] (Phone) [phone] (Fax)    

## 2018-12-19 ENCOUNTER — Ambulatory Visit: Payer: Medicare Other

## 2018-12-19 DIAGNOSIS — G4733 Obstructive sleep apnea (adult) (pediatric): Secondary | ICD-10-CM | POA: Diagnosis not present

## 2018-12-19 DIAGNOSIS — M2550 Pain in unspecified joint: Secondary | ICD-10-CM | POA: Diagnosis not present

## 2018-12-19 DIAGNOSIS — M1990 Unspecified osteoarthritis, unspecified site: Secondary | ICD-10-CM | POA: Diagnosis not present

## 2018-12-22 ENCOUNTER — Other Ambulatory Visit: Payer: Self-pay

## 2018-12-22 NOTE — Patient Outreach (Signed)
[facility]) Care Management  12/22/2018   Vanessa Palmer 02-22-1951 [id number]  Subjective: Successful outreach to the patient. Two patient identifiers obtained.  The patient states that she is doing well.  She states that her right arm has been sore and is "hard to lift above her breast".  She states that she will follow up with her doctor.  She states that she did have one fall.  She fell out of her bed but did not injure herself.  She reports that she is taking her medications but has been started on two new medications for her blood pressure.  Hydralazine 50 mg bid and Losartan 25 mg daily. She states that her BP yesterday was 144/84.  She states that she does not check everyday.  She feels that she gets frustrated if it is not low.  Advised the patient to check her BP every other day to start out.  The patient verbalized understanding.  She states that she is scheduled for a sleep study on Wednesday.  The patient was informed that I will be transitioning to a new position and one of my colleagues will follow up with her.  Current Medications:  Current Outpatient Medications  Medication Sig Dispense Refill  . acetaminophen (TYLENOL) 500 MG tablet Take 500-1,000 mg by mouth every 6 (six) hours as needed (for pain).    Marland Kitchen amLODipine (NORVASC) 10 MG tablet Take 1 tablet (10 mg total) by mouth daily. 90 tablet 3  . atorvastatin (LIPITOR) 40 MG tablet Take 1 tablet (40 mg total) by mouth daily. 90 tablet 3  . EPINEPHrine 0.3 mg/0.3 mL IJ SOAJ injection Inject 0.3 mLs (0.3 mg total) into the muscle as needed for anaphylaxis. 1 Device 3  . FLOVENT HFA 44 MCG/ACT inhaler INHALE TWO PUFFS INTO THE LUNGS TWICE A DAY 31.8 g 11  . furosemide (LASIX) 40 MG tablet Take 1 tablet (40 mg total) by mouth daily. 90 tablet 3  . hydrALAZINE (APRESOLINE) 50 MG tablet Take 1 tablet (50 mg total) by mouth 2 (two) times daily. 120 tablet 2  . losartan (COZAAR) 25 MG tablet Take 1 tablet (25 mg  total) by mouth daily. 30 tablet 11  . metoprolol succinate (TOPROL-XL) 50 MG 24 hr tablet Take 50 mg by mouth daily.    . nitroGLYCERIN (NITROSTAT) 0.4 MG SL tablet PLACE ONE TABLET UNDER THE TONGUE EVERY FIVE MINUTES AS NEEDED FOR CHEST PAIN 25 tablet 0  . omeprazole (PRILOSEC) 20 MG capsule TAKE 1 CAPSULE (20 MG TOTAL) BY MOUTH DAILY. 90 capsule 3  . oxybutynin (DITROPAN) 5 MG tablet TAKE ONE TABLET BY MOUTH TWICE A DAY 180 tablet 0  . oxyCODONE-acetaminophen (PERCOCET) 10-325 MG tablet Take 1 tablet by mouth every 8 (eight) hours as needed for pain (chronic arthritis pain, multiple sites, diag code M16.11, M25.50). 90 tablet 0  . sertraline (ZOLOFT) 100 MG tablet Take 1 tablet (100 mg total) by mouth at bedtime. 30 tablet 6  . XARELTO 20 MG TABS tablet TAKE ONE TABLET BY MOUTH DAILY WITH SUPPER 90 tablet 3  . zaleplon (SONATA) 5 MG capsule Take 1 capsule (5 mg total) by mouth at bedtime as needed for sleep. 7 capsule 0  . [DISCONTINUED] furosemide (LASIX) 20 MG tablet Take one half of the tablet once daily for 5 days. Then stop 10 tablet 0   No current facility-administered medications for this visit.     Functional Status:  In your present state of health, do you  have any difficulty performing the following activities: 11/21/2018 10/30/2018  Hearing? N N  Vision? N N  Difficulty concentrating or making decisions? N N  Walking or climbing stairs? Y Y  Dressing or bathing? Y N  Comment - -  Doing errands, shopping? N Y  Comment - -  Some recent data might be hidden    Fall/Depression Screening: Fall Risk  12/22/2018 11/21/2018 10/30/2018  Falls in the past year? 1 1 1   Number falls in past yr: 0 1 1  Injury with Fall? 0 1 0  Risk Factor Category  - - -  Comment - - -  Risk for fall due to : - Impaired balance/gait;Impaired mobility;History of fall(s) History of fall(s);Impaired balance/gait;Impaired mobility  Risk for fall due to: Comment - - -  Follow up - Falls prevention discussed  Falls prevention discussed   PHQ 2/9 Scores 11/21/2018 10/30/2018 10/21/2018 07/30/2018 03/19/2018 03/06/2018 01/08/2018  PHQ - 2 Score 3 4 3 5 2 3 5   PHQ- 9 Score 15 11 11 17 10 12 18     Assessment: Patient will continue to benefit from health coach outreach for disease management and support. THN CM Care Plan Problem One     Most Recent Value  THN Long Term Goal   In 60 days the patient will verbalize that she is checking her blood pressure daily,  THN Long Term Goal Start Date  12/22/18  Interventions for Problem One Long Term Goal  Reviewed blood presure readings, Reviewed signs and symptoms of HTN, talked about sodium in a diet encouraged the patient to try and check her blood pressures every other day, encouraged medication adherence     Plan: RN Health Coach will contact patient in the month December of  and patient agrees to next outreach.   RN, BSN, [facility] RN Health Coach Disease Management [facility]:  [phone]  Fax: [phone]

## 2018-12-24 ENCOUNTER — Other Ambulatory Visit: Payer: Self-pay

## 2018-12-24 ENCOUNTER — Ambulatory Visit (INDEPENDENT_AMBULATORY_CARE_PROVIDER_SITE_OTHER): Payer: Medicare Other | Admitting: Neurology

## 2018-12-24 DIAGNOSIS — R351 Nocturia: Secondary | ICD-10-CM

## 2018-12-24 DIAGNOSIS — G4720 Circadian rhythm sleep disorder, unspecified type: Secondary | ICD-10-CM

## 2018-12-24 DIAGNOSIS — G4719 Other hypersomnia: Secondary | ICD-10-CM

## 2018-12-24 DIAGNOSIS — G4733 Obstructive sleep apnea (adult) (pediatric): Secondary | ICD-10-CM | POA: Diagnosis not present

## 2018-12-24 DIAGNOSIS — Z6841 Body Mass Index (BMI) 40.0 and over, adult: Secondary | ICD-10-CM

## 2018-12-26 ENCOUNTER — Encounter: Payer: Self-pay | Admitting: *Deleted

## 2018-12-26 NOTE — Telephone Encounter (Signed)
This encounter was created in error - please disregard.

## 2019-01-05 ENCOUNTER — Telehealth: Payer: Self-pay | Admitting: Neurology

## 2019-01-05 ENCOUNTER — Other Ambulatory Visit: Payer: Self-pay | Admitting: Internal Medicine

## 2019-01-05 NOTE — Procedures (Signed)
PATIENT'S NAME:  Vanessa Palmer, Storck DOB:      1950/08/01      MR#:    [id number]     DATE OF RECORDING: 12/24/2018 REFERRING M.D.:  Gilles Chiquito, MD Study Performed:   Baseline Polysomnogram HISTORY: 68 year old woman with a history of heart disease including history of MI, history of DVT, degenerative disc disease, depression, chronic kidney disease, CHF, history of alcohol abuse, anemia, arthritis, history of seizure in the remote past, and morbid obesity with a BMI of over 43, who was previously diagnosed with obstructive sleep apnea and placed on CPAP therapy. She no longer has a CPAP machine. The patient endorsed the Epworth Sleepiness Scale at 17 points. The patient's weight 263 pounds with a height of 62 (inches), resulting in a BMI of 48.3 kg/m2. The patient's neck circumference measured 15.5 inches.  CURRENT MEDICATIONS: Tylenol, Norvasc, Lipitor, EpiPen, Flovent, Lasix, Apresoline, Cozaar, Toprol-XL, Nitrostat, Prilosec, Ditropan, Percocet, Zoloft, Xarelto, Sonata   PROCEDURE:  This is a multichannel digital polysomnogram utilizing the Somnostar 11.2 system.  Electrodes and sensors were applied and monitored per AASM Specifications.   EEG, EOG, Chin and Limb EMG, were sampled at 200 Hz.  ECG, Snore and Nasal Pressure, Thermal Airflow, Respiratory Effort, CPAP Flow and Pressure, Oximetry was sampled at 50 Hz. Digital video and audio were recorded.      BASELINE STUDY  Lights Out was at 22:03 and Lights On at 05:03.  Total recording time (TRT) was 420 minutes, with a total sleep time (TST) of 336 minutes.   The patient's sleep latency was 12.5 minutes.  REM latency was 157 minutes, which is delayed. The sleep efficiency was 80%.     SLEEP ARCHITECTURE: WASO (Wake after sleep onset) was 76.5 minutes, with mild to moderate sleep fragmentation noted. There were 44 minutes in Stage N1, 227.5 minutes Stage N2, 0.5 minutes Stage N3 and 64 minutes in Stage REM.  The percentage of Stage N1 was 13.1%,  which is increased, Stage N2 was 67.7%,  which is increased, Stage N3 was .1% and Stage R (REM sleep) was 19.%, which is normal.  RESPIRATORY ANALYSIS:  There were a total of 300 respiratory events:  29 obstructive apneas, 0 central apneas and 0 mixed apneas with a total of 29 apneas and an apnea index (AI) of 5.2 /hour. There were 271 hypopneas with a hypopnea index of 48.4 /hour. The patient also had 0 respiratory event related arousals (RERAs).      The total APNEA/HYPOPNEA INDEX (AHI) was 53.6 /hour and the total RESPIRATORY DISTURBANCE INDEX was 0. 53.6 /hour.  62 events occurred in REM sleep and 454 events in NREM. The REM AHI was 58.1 /hour, versus a non-REM AHI of 52.5. The patient spent 0 minutes of total sleep time in the supine position and 336 minutes in non-supine.. The supine AHI was n/a, non-supine AHI of 53.6.  OXYGEN SATURATION & C02:  The Wake baseline 02 saturation was 98%, with the lowest being 79%. Time spent below 89% saturation equaled 33 minutes. PERIODIC LIMB MOVEMENTS: The patient had a total of 0 Periodic Limb Movements.  The Periodic Limb Movement (PLM) index was 0 and the PLM Arousal index was 0/hour. The arousals were noted as: 85 were spontaneous, 0 were associated with PLMs, 177 were associated with respiratory events.  Audio and video analysis did not show any abnormal or unusual movements, behaviors, phonations or vocalizations. The patient took 2 bathroom breaks. Moderate to loud snoring was noted. The EKG was in  keeping with normal sinus rhythm (NSR).  Post-study, the patient indicated that sleep was the same as usual.   IMPRESSION:  1. Obstructive Sleep Apnea (OSA) 2. Dysfunctions associated with sleep stages or arousal from sleep  RECOMMENDATIONS:  1. This study demonstrates severe obstructive sleep apnea, with a total AHI of 53.6/hour, REM AHI of 58.1/hour, and O2 nadir of 79%. The absence of supine sleep may underestimate her AHI and O2 nadir. Treatment  with positive airway pressure in the form of CPAP is recommended. This will require a full night titration study to optimize therapy. Other treatment options may include avoidance of supine sleep position along with weight loss, upper airway or jaw surgery in selected patients or the use of an oral appliance in certain patients. ENT evaluation and/or consultation with a maxillofacial surgeon or dentist may be feasible in some instances. Please note that untreated obstructive sleep apnea may carry additional perioperative morbidity. Patients with significant obstructive sleep apnea should receive perioperative PAP therapy and the surgeons and particularly the anesthesiologist should be informed of the diagnosis and the severity of the sleep disordered breathing. 2. This study shows sleep fragmentation and abnormal sleep stage percentages; these are nonspecific findings and per se do not signify an intrinsic sleep disorder or a cause for the patient's sleep-related symptoms. Causes include (but are not limited to) the first night effect of the sleep study, circadian rhythm disturbances, medication effect or an underlying mood disorder or medical problem.  3. The patient should be cautioned not to drive, work at heights, or operate dangerous or heavy equipment when tired or sleepy. Review and reiteration of good sleep hygiene measures should be pursued with any patient. 4. The patient will be seen in follow-up in the sleep clinic at [facility] for discussion of the test results, symptom and treatment compliance review, further management strategies, etc. The referring provider will be notified of the test results.  I certify that I have reviewed the entire raw data recording prior to the issuance of this report in accordance with the Standards of Accreditation of the American Academy of Sleep Medicine (AASM)   Huston Foley, MD, PhD Diplomat, American Board of Neurology and Sleep Medicine (Neurology and Sleep  Medicine)

## 2019-01-05 NOTE — Telephone Encounter (Signed)
-----   Message from Star Age, MD sent at 01/05/2019  8:19 AM EST ----- Patient referred by primary care resident, seen by me on 11/25/18, diagnostic PSG on 12/24/18.   Please call and notify the patient that the recent sleep study showed severe obstructive sleep apnea. I recommend treatment for this in the form of CPAP. This will require a repeat sleep study for proper titration and mask fitting and correct monitoring of the oxygen saturations. Please explain to patient. I have placed an order in the chart. Thanks.  Star Age, MD, PhD [facility])

## 2019-01-05 NOTE — Progress Notes (Signed)
Patient referred by primary care resident, seen by me on 11/25/18, diagnostic PSG on 12/24/18.   Please call and notify the patient that the recent sleep study showed severe obstructive sleep apnea. I recommend treatment for this in the form of CPAP. This will require a repeat sleep study for proper titration and mask fitting and correct monitoring of the oxygen saturations. Please explain to patient. I have placed an order in the chart. Thanks.  Star Age, MD, PhD [facility])

## 2019-01-05 NOTE — Telephone Encounter (Signed)
Called patient to discuss sleep study results. No answer at this time. LVM for the patient to call back.   

## 2019-01-05 NOTE — Telephone Encounter (Signed)
Pt has called RN Casey back, please call °

## 2019-01-05 NOTE — Addendum Note (Signed)
Addended by: Star Age on: 01/05/2019 08:19 AM   Modules accepted: Orders

## 2019-01-05 NOTE — Telephone Encounter (Signed)
I called pt. I advised pt that Dr. Athar reviewed their sleep study results and found that has severe sleep apnea and recommends that pt be treated with a cpap. Dr. Athar recommends that pt return for a repeat sleep study in order to properly titrate the cpap and ensure a good mask fit. Pt is agreeable to returning for a titration study. I advised pt that our sleep lab will file with pt's insurance and call pt to schedule the sleep study when we hear back from the pt's insurance regarding coverage of this sleep study. Pt verbalized understanding of results. Pt had no questions at this time but was encouraged to call back if questions arise.   

## 2019-01-19 ENCOUNTER — Other Ambulatory Visit: Payer: Self-pay | Admitting: Internal Medicine

## 2019-01-19 DIAGNOSIS — M2550 Pain in unspecified joint: Secondary | ICD-10-CM

## 2019-01-19 NOTE — Telephone Encounter (Signed)
Needs refill on oxyCODONE-acetaminophen (PERCOCET) 10-325 MG tablet (Expired)  ;pt contact [phone] ° °[id number] - [place], [place] - [address] AT [address] OF [address] °

## 2019-01-20 ENCOUNTER — Other Ambulatory Visit: Payer: Self-pay | Admitting: Internal Medicine

## 2019-01-20 MED ORDER — OXYCODONE-ACETAMINOPHEN 10-325 MG PO TABS: 1.0000 | ORAL_TABLET | Freq: Three times a day (TID) | ORAL | 0 refills | Status: DC | PRN

## 2019-02-02 DIAGNOSIS — E785 Hyperlipidemia, unspecified: Secondary | ICD-10-CM | POA: Diagnosis not present

## 2019-02-02 DIAGNOSIS — I25118 Atherosclerotic heart disease of native coronary artery with other forms of angina pectoris: Secondary | ICD-10-CM | POA: Diagnosis not present

## 2019-02-02 DIAGNOSIS — N189 Chronic kidney disease, unspecified: Secondary | ICD-10-CM | POA: Diagnosis not present

## 2019-02-02 DIAGNOSIS — M1990 Unspecified osteoarthritis, unspecified site: Secondary | ICD-10-CM | POA: Diagnosis not present

## 2019-02-02 DIAGNOSIS — I10 Essential (primary) hypertension: Secondary | ICD-10-CM | POA: Diagnosis not present

## 2019-02-17 ENCOUNTER — Other Ambulatory Visit: Payer: Self-pay | Admitting: Internal Medicine

## 2019-02-17 DIAGNOSIS — M2550 Pain in unspecified joint: Secondary | ICD-10-CM

## 2019-02-17 NOTE — Telephone Encounter (Signed)
Needs refill on oxyCODONE-acetaminophen (PERCOCET) 10-325 MG tablet ;pt contact [phone]   [id number] - [place], [place] - [address] AT [address] OF [address] 

## 2019-02-18 MED ORDER — OXYCODONE-ACETAMINOPHEN 10-325 MG PO TABS: 1.0000 | ORAL_TABLET | Freq: Three times a day (TID) | ORAL | 0 refills | Status: DC | PRN

## 2019-02-24 ENCOUNTER — Other Ambulatory Visit: Payer: Self-pay | Admitting: *Deleted

## 2019-02-24 NOTE — Patient Outreach (Signed)
[facility]) Care Management  02/24/2019  Vanessa Palmer [birth year] [id number]   RN Health Coach attempted follow up outreach call to patient.  Patient was unavailable. HIPPA compliance voicemail message left with return callback number.  Plan: RN will call patient again within 30 days.  [place] Care Management [phone]

## 2019-03-09 ENCOUNTER — Other Ambulatory Visit: Payer: Self-pay | Admitting: *Deleted

## 2019-03-09 DIAGNOSIS — K219 Gastro-esophageal reflux disease without esophagitis: Secondary | ICD-10-CM

## 2019-03-11 MED ORDER — OMEPRAZOLE 20 MG PO CPDR: 20.0000 mg | DELAYED_RELEASE_CAPSULE | Freq: Every day | ORAL | 3 refills | Status: DC

## 2019-03-19 ENCOUNTER — Other Ambulatory Visit: Payer: Self-pay | Admitting: Internal Medicine

## 2019-03-19 DIAGNOSIS — M2550 Pain in unspecified joint: Secondary | ICD-10-CM

## 2019-03-19 MED ORDER — OXYCODONE-ACETAMINOPHEN 10-325 MG PO TABS: 1.0000 | ORAL_TABLET | Freq: Three times a day (TID) | ORAL | 0 refills | Status: DC | PRN

## 2019-03-19 NOTE — Telephone Encounter (Signed)
Needs refill on oxyCODONE-acetaminophen (PERCOCET) 10-325 MG tablet ;pt contact [phone]   [id number] - [place], [place] - [address] AT [facility] OF [address]

## 2019-03-27 ENCOUNTER — Other Ambulatory Visit: Payer: Self-pay | Admitting: *Deleted

## 2019-03-27 NOTE — Patient Outreach (Signed)
[facility]) Care Management  03/27/2019  Vanessa Palmer 11/30/1950 [id number]   RN Health Coach attempted follow up outreach call to patient.  Patient was unavailable. HIPPA compliance voicemail message left with return callback number.  Plan: RN sent Unsuccessful outreach letter RN will call patient again within 30 days.  Gean Maidens BSN RN [facility] Care Management [phone]

## 2019-03-31 ENCOUNTER — Other Ambulatory Visit: Payer: Self-pay | Admitting: Internal Medicine

## 2019-04-20 ENCOUNTER — Other Ambulatory Visit: Payer: Self-pay | Admitting: Internal Medicine

## 2019-04-20 DIAGNOSIS — M2550 Pain in unspecified joint: Secondary | ICD-10-CM

## 2019-04-20 NOTE — Telephone Encounter (Signed)
Needs refill on oxyCODONE-acetaminophen (PERCOCET) 10-325 MG tablet(Expired)  ;pt contact [phone]  [id number] - [place], [place] - [address] AT [facility] [address]

## 2019-04-21 MED ORDER — OXYCODONE-ACETAMINOPHEN 10-325 MG PO TABS: 1.0000 | ORAL_TABLET | Freq: Three times a day (TID) | ORAL | 0 refills | Status: DC | PRN

## 2019-04-21 NOTE — Telephone Encounter (Signed)
Pt is calling back [phone]

## 2019-04-24 ENCOUNTER — Other Ambulatory Visit: Payer: Self-pay | Admitting: *Deleted

## 2019-04-24 NOTE — Patient Outreach (Signed)
[facility]) Care Management  [facility] Care Manager  04/24/2019   Vanessa Palmer Sep 05, 1950 [id number]  RN Health Coach telephone call to patient.  Hipaa compliance verified. Per patient she is not monitoring her blood pressure. Patient stated that she is having some swelling in her legs. Per patient she is feeling depressed and doesn't want to do anything. RN discussed the services available for counseling. Patient declined Child psychotherapist calling her. Patient is currently on medication. RN discussed with patient about letting her physician know since she won't talk with a therapist. Patient stated she had been talking with a friend. Patient agreed that she would talk with her physician. Per patient she mostly eats one meal a day . She has a caregiver that comes everyday to help her get dressed and fixes her meal and does the house work. Patient stated that she is taking her medications as per ordered.  Patient agreed for RN Health Coach to speak with Caregiver [name]. RN discussed with [name] about checking the patient blood pressure and documenting. RN discussed with caregiver about fixing low sodium meals and snacks.  Patient has agreed to further outreach calls.   Encounter Medications:  Outpatient Encounter Medications as of 04/24/2019  Medication Sig  . acetaminophen (TYLENOL) 500 MG tablet Take 500-1,000 mg by mouth every 6 (six) hours as needed (for pain).  Marland Kitchen amLODipine (NORVASC) 10 MG tablet Take 1 tablet (10 mg total) by mouth daily.  Marland Kitchen atorvastatin (LIPITOR) 40 MG tablet Take 1 tablet (40 mg total) by mouth daily.  Marland Kitchen EPINEPHrine 0.3 mg/0.3 mL IJ SOAJ injection Inject 0.3 mLs (0.3 mg total) into the muscle as needed for anaphylaxis.  Marland Kitchen FLOVENT HFA 44 MCG/ACT inhaler INHALE TWO PUFFS INTO THE LUNGS TWICE A DAY  . furosemide (LASIX) 40 MG tablet Take 1 tablet (40 mg total) by mouth daily.  . hydrALAZINE (APRESOLINE) 50 MG tablet TAKE 1 TABLET (50 MG TOTAL) BY MOUTH  TWO (TWO) TIMES DAILY.  Marland Kitchen losartan (COZAAR) 25 MG tablet Take 1 tablet (25 mg total) by mouth daily.  . metoprolol succinate (TOPROL-XL) 50 MG 24 hr tablet Take 50 mg by mouth daily.  . nitroGLYCERIN (NITROSTAT) 0.4 MG SL tablet PLACE ONE TABLET UNDER THE TONGUE EVERY FIVE MINUTES AS NEEDED FOR CHEST PAIN  . omeprazole (PRILOSEC) 20 MG capsule Take 1 capsule (20 mg total) by mouth daily.  Marland Kitchen oxybutynin (DITROPAN) 5 MG tablet TAKE ONE TABLET BY MOUTH TWICE A DAY  . oxyCODONE-acetaminophen (PERCOCET) 10-325 MG tablet Take 1 tablet by mouth every 8 (eight) hours as needed for pain (chronic arthritis pain, multiple sites, diag code M16.11, M25.50).  Marland Kitchen sertraline (ZOLOFT) 100 MG tablet TAKE ONE TABLET BY MOUTH AT BEDTIME  . XARELTO 20 MG TABS tablet TAKE ONE TABLET BY MOUTH DAILY WITH SUPPER  . zaleplon (SONATA) 5 MG capsule Take 1 capsule (5 mg total) by mouth at bedtime as needed for sleep.  . [DISCONTINUED] furosemide (LASIX) 20 MG tablet Take one half of the tablet once daily for 5 days. Then stop   No facility-administered encounter medications on file as of 04/24/2019.    Functional Status:  In your present state of health, do you have any difficulty performing the following activities: 04/24/2019 11/21/2018  Hearing? N N  Vision? Y N  Difficulty concentrating or making decisions? Y N  Comment per patient she is very depressed -  Walking or climbing stairs? N Y  Comment orbid obesity. per patient knees  hurt and short of breath with activity -  Dressing or bathing? N Y  Comment patient has an aide that helps with bathing -  Doing errands, shopping? Y N  Comment Aide does errands -  Quarry manager and eating ? Y -  Comment aide prepares meals -  Using the Toilet? N -  In the past six months, have you accidently leaked urine? Y -  Comment some incontinence -  Do you have problems with loss of bowel control? N -  Managing your Medications? N -  Managing your Finances? N -  Housekeeping or  managing your Housekeeping? Y -  Comment aide helps with housework -  Some recent data might be hidden    Fall/Depression Screening: Fall Risk  04/24/2019 12/22/2018 11/21/2018  Falls in the past year? 1 1 1   Number falls in past yr: 1 0 1  Injury with Fall? 0 0 1  Risk Factor Category  - - -  Comment - - -  Risk for fall due to : History of fall(s);Impaired balance/gait;Impaired mobility - Impaired balance/gait;Impaired mobility;History of fall(s)  Risk for fall due to: Comment - - -  Follow up Falls evaluation completed;Falls prevention discussed - Falls prevention discussed   PHQ 2/9 Scores 04/24/2019 11/21/2018 10/30/2018 10/21/2018 07/30/2018 03/19/2018 03/06/2018  PHQ - 2 Score 4 3 4 3 5 2 3   PHQ- 9 Score 14 15 11 11 17 10 12    THN CM Care Plan Problem One     Most Recent Value  Care Plan Problem One  Knowledge Deficit in Self Management of Hypertension  Role Documenting the Problem One  Health Coach  Care Plan for Problem One  Active  THN Long Term Goal   Patient and care aide will monitor and document blood pressures daily within the next 90 days  THN Long Term Goal Start Date  04/24/19  Interventions for Problem One Long Term Goal  RN discussed the importance of checking blood pressure. RN discussed this will help to see if the medication is effective.RN sent a 2021 calendar book for documentation and will follow up for compliance  THN CM Short Term Goal #1   Patient will verbalize understanding the complications of uncontrolled blood pressure within the next 30 days  THN CM Short Term Goal #1 Start Date  04/24/19  Interventions for Short Term Goal #1  RN discussed how uncontrolled hypertension affects the body organs snd can cause heart attack and strokes. RN sent EMMI education on What is a stroke and High blood pressure health problems.  THN CM Short Term Goal #2   Patient will verbalize receiving picture sheet of foods high and low in sodium  within the next 30 days  THN CM Short  Term Goal #2 Start Date  04/24/19  Interventions for Short Term Goal #2  RN discussed foods high and low in sodium. RN sent a picture sheet of foods high and low in sodium. RN will follow up with further discussion       Assessment:  Patient is not monitoring blood pressure Per patient she is taking her medications Per patient she eats one meal a day Patient does have some swelling in lower extremities Patient will benefit from Health Coach telephonic outreach for education and support for hypertension self management.  Plan:  Patient and caregiver will monitor blood pressure and document in calendar book RN sent a 2021 Calendar book RN discussed with caregiver about making snacks and fixing low sodium meals  RN sent a picture sheet of foods high and low in sodium RN sent EMMI education will follow up within the month of March RN sent an assessment and barriers letter to PCP  [place] Management [phone]

## 2019-05-15 ENCOUNTER — Ambulatory Visit (INDEPENDENT_AMBULATORY_CARE_PROVIDER_SITE_OTHER): Payer: Medicare Other | Admitting: Internal Medicine

## 2019-05-15 ENCOUNTER — Encounter: Payer: Self-pay | Admitting: Internal Medicine

## 2019-05-15 VITALS — BP 129/81 | HR 65 | Temp 98.2°F | Ht 62.0 in | Wt 269.3 lb

## 2019-05-15 DIAGNOSIS — M1990 Unspecified osteoarthritis, unspecified site: Secondary | ICD-10-CM

## 2019-05-15 DIAGNOSIS — G4762 Sleep related leg cramps: Secondary | ICD-10-CM | POA: Diagnosis not present

## 2019-05-15 DIAGNOSIS — Z79891 Long term (current) use of opiate analgesic: Secondary | ICD-10-CM

## 2019-05-15 DIAGNOSIS — N183 Chronic kidney disease, stage 3 unspecified: Secondary | ICD-10-CM

## 2019-05-15 DIAGNOSIS — Z79899 Other long term (current) drug therapy: Secondary | ICD-10-CM

## 2019-05-15 DIAGNOSIS — K5900 Constipation, unspecified: Secondary | ICD-10-CM

## 2019-05-15 DIAGNOSIS — F329 Major depressive disorder, single episode, unspecified: Secondary | ICD-10-CM

## 2019-05-15 DIAGNOSIS — I517 Cardiomegaly: Secondary | ICD-10-CM

## 2019-05-15 DIAGNOSIS — E669 Obesity, unspecified: Secondary | ICD-10-CM

## 2019-05-15 DIAGNOSIS — N1831 Chronic kidney disease, stage 3a: Secondary | ICD-10-CM | POA: Diagnosis not present

## 2019-05-15 DIAGNOSIS — I2510 Atherosclerotic heart disease of native coronary artery without angina pectoris: Secondary | ICD-10-CM

## 2019-05-15 DIAGNOSIS — Z7901 Long term (current) use of anticoagulants: Secondary | ICD-10-CM

## 2019-05-15 DIAGNOSIS — E1122 Type 2 diabetes mellitus with diabetic chronic kidney disease: Secondary | ICD-10-CM | POA: Diagnosis not present

## 2019-05-15 DIAGNOSIS — I10 Essential (primary) hypertension: Secondary | ICD-10-CM

## 2019-05-15 DIAGNOSIS — M109 Gout, unspecified: Secondary | ICD-10-CM

## 2019-05-15 DIAGNOSIS — Z6841 Body Mass Index (BMI) 40.0 and over, adult: Secondary | ICD-10-CM

## 2019-05-15 DIAGNOSIS — M810 Age-related osteoporosis without current pathological fracture: Secondary | ICD-10-CM

## 2019-05-15 DIAGNOSIS — M1A9XX0 Chronic gout, unspecified, without tophus (tophi): Secondary | ICD-10-CM

## 2019-05-15 DIAGNOSIS — I129 Hypertensive chronic kidney disease with stage 1 through stage 4 chronic kidney disease, or unspecified chronic kidney disease: Secondary | ICD-10-CM | POA: Diagnosis not present

## 2019-05-15 DIAGNOSIS — M62838 Other muscle spasm: Secondary | ICD-10-CM

## 2019-05-15 DIAGNOSIS — J45909 Unspecified asthma, uncomplicated: Secondary | ICD-10-CM

## 2019-05-15 DIAGNOSIS — I209 Angina pectoris, unspecified: Secondary | ICD-10-CM

## 2019-05-15 DIAGNOSIS — Z86718 Personal history of other venous thrombosis and embolism: Secondary | ICD-10-CM

## 2019-05-15 DIAGNOSIS — M2550 Pain in unspecified joint: Secondary | ICD-10-CM

## 2019-05-15 DIAGNOSIS — K219 Gastro-esophageal reflux disease without esophagitis: Secondary | ICD-10-CM

## 2019-05-15 DIAGNOSIS — E785 Hyperlipidemia, unspecified: Secondary | ICD-10-CM

## 2019-05-15 DIAGNOSIS — I82403 Acute embolism and thrombosis of unspecified deep veins of lower extremity, bilateral: Secondary | ICD-10-CM

## 2019-05-15 MED ORDER — BACLOFEN 10 MG PO TABS: 10.0000 mg | ORAL_TABLET | Freq: Every day | ORAL | 1 refills | Status: DC | PRN

## 2019-05-15 MED ORDER — OXYCODONE-ACETAMINOPHEN 10-325 MG PO TABS: 1.0000 | ORAL_TABLET | Freq: Three times a day (TID) | ORAL | 0 refills | Status: DC | PRN

## 2019-05-15 NOTE — Assessment & Plan Note (Signed)
She is on chronic Xarelto and notes no issues with bleeding today.   Plan Continue Xarelto

## 2019-05-15 NOTE — Assessment & Plan Note (Signed)
BP is well controlled today at 129/81.  She reports taking amlodipine, hydralazine, metoprolol and losartan.  She takes her lasix daily.  She has no dizziness or low blood pressures to report and no concerns today.   Plan Continue current therapy Check BMET today.

## 2019-05-15 NOTE — Assessment & Plan Note (Signed)
Will plan to check her BMET today.  Has been stable.  Urine output is stable per patient.    Plan BMET today

## 2019-05-15 NOTE — Assessment & Plan Note (Signed)
She reports no symptoms today.  No need for NTG use.

## 2019-05-15 NOTE — Assessment & Plan Note (Signed)
Well controlled on omeprazole which will be continued.

## 2019-05-15 NOTE — Assessment & Plan Note (Signed)
Has not had a recent issue or a recent flair.  Doing well.

## 2019-05-15 NOTE — Patient Instructions (Addendum)
[name] - -  Thank you for coming in today!  For your spasms/cramping, please try baclofen.  This medication may make you drowsy.  Please see below for more information.   You are due for your DEXA (bone density scan), please schedule this when you are able.   Come back to see me in 4 months.   Thank you!  Baclofen tablets What is this medicine? BACLOFEN (BAK loe fen) helps relieve spasms and cramping of muscles. It may be used to treat symptoms of multiple sclerosis or spinal cord injury. This medicine may be used for other purposes; ask your health care provider or pharmacist if you have questions. COMMON BRAND NAME(S): ED Baclofen, Lioresal What should I tell my health care provider before I take this medicine? They need to know if you have any of these conditions:  kidney disease  seizures  stroke  an unusual or allergic reaction to baclofen, other medicines, foods, dyes, or preservatives  pregnant or trying to get pregnant  breast-feeding How should I use this medicine? Take this medicine by mouth. Swallow it with a drink of water. Follow the directions on the prescription label. Do not take more medicine than you are told to take. Talk to your pediatrician regarding the use of this medicine in children. Special care may be needed. Overdosage: If you think you have taken too much of this medicine contact a poison control center or emergency room at once. NOTE: This medicine is only for you. Do not share this medicine with others. What if I miss a dose? If you miss a dose, take it as soon as you can. If it is almost time for your next dose, take only that dose. Do not take double or extra doses. What may interact with this medicine? Do not take this medication with any of the following medicines:  narcotic medicines for cough This medicine may also interact with the following medications:  alcohol  antihistamines for allergy, cough and cold  certain medicines  for anxiety or sleep  certain medicines for depression like amitriptyline, fluoxetine, sertraline  certain medicines for seizures like phenobarbital, primidone  general anesthetics like halothane, isoflurane, methoxyflurane, propofol  local anesthetics like lidocaine, pramoxine, tetracaine  medicines that relax muscles for surgery  narcotic medicines for pain  phenothiazines like chlorpromazine, mesoridazine, prochlorperazine, thioridazine This list may not describe all possible interactions. Give your health care provider a list of all the medicines, herbs, non-prescription drugs, or dietary supplements you use. Also tell them if you smoke, drink alcohol, or use illegal drugs. Some items may interact with your medicine. What should I watch for while using this medicine? Tell your doctor or health care professional if your symptoms do not start to get better or if they get worse. Do not suddenly stop taking your medicine. If you do, you may develop a severe reaction. If your doctor wants you to stop the medicine, the dose will be slowly lowered over time to avoid any side effects. Follow the advice of your doctor. You may get drowsy or dizzy. Do not drive, use machinery, or do anything that needs mental alertness until you know how this medicine affects you. Do not stand or sit up quickly, especially if you are an older patient. This reduces the risk of dizzy or fainting spells. Alcohol may interfere with the effect of this medicine. Avoid alcoholic drinks. If you are taking another medicine that also causes drowsiness, you may have more side effects. Give your  health care provider a list of all medicines you use. Your doctor will tell you how much medicine to take. Do not take more medicine than directed. Call emergency for help if you have problems breathing or unusual sleepiness. What side effects may I notice from receiving this medicine? Side effects that you should report to your doctor  or health care professional as soon as possible:  allergic reactions like skin rash, itching or hives, swelling of the face, lips, or tongue  breathing problems  changes in emotions or moods  changes in vision  chest pain  fast, irregular heartbeat  feeling faint or lightheaded, falls  hallucinations  loss of balance or coordination  ringing of the ears  seizures  trouble passing urine or change in the amount of urine  trouble walking  unusually weak or tired Side effects that usually do not require medical attention (report to your doctor or health care professional if they continue or are bothersome):  changes in taste  confusion  constipation  diarrhea  dry mouth  headache  muscle weakness  nausea, vomiting  trouble sleeping This list may not describe all possible side effects. Call your doctor for medical advice about side effects. You may report side effects to FDA at 1-800-FDA-1088. Where should I keep my medicine? Keep out of the reach of children. Store at room temperature between 15 and 30 degrees C (59 and 86 degrees F). Keep container tightly closed. Throw away any unused medicine after the expiration date. NOTE: This sheet is a summary. It may not cover all possible information. If you have questions about this medicine, talk to your doctor, pharmacist, or health care provider.  2020 Elsevier/Gold Standard (2016-11-24 09:56:42)

## 2019-05-15 NOTE — Assessment & Plan Note (Signed)
She has some issues with LE edema and takes lasix daily.  Check BMET today.

## 2019-05-15 NOTE — Assessment & Plan Note (Signed)
Given the issues in her abdomen now, will plan a trial of baclofen to see if this helps.  She will remember to take at night.

## 2019-05-15 NOTE — Progress Notes (Signed)
   Subjective:    Patient ID: Vanessa Palmer, female    DOB: [birth year], 69 y.o.   MRN: [id number]  CC: 4 month follow up for HTN  HPI  Vanessa Palmer is a 69 year old woman with PMH of HTN, CAD, DVT, Asthma, GERD, OA, CKD 3, MDD, HLD, Gout, Obesity who presents for follow up.   She reports that she is doing well.  She has an issue with muscle spasms that have been concerning her.  She notes that she will get muscle spasms that run through her abdomen which are similar to a baby moving.  She will note them when she is resting or when moving around.  She feels that they make her stop what she is doing and are very sharp in nature.  They last 10-15 minutes and then slowly resolve with rest.  Her oxycodone sometimes helps.  She has had no breakthrough heartburn, fever, chills, vomiting.  She does have some nausea at times, but is not sure if it is associated.  She has a BM every 1-2 days with miralax, but does not feel like these are constipation like symptoms.  She reports that they are sometimes related to eating but not all the time.  She has similar cramping feelings in her legs as well.    She other wise notes that she is doing very well.  Denies chest pain, fever, chills, SOB.  She is walking with a cane today and doing well, reports her OA pain is well controlled.  She has gained weight since our last visit, about 6 pounds.  She reports no issues with gout recently.   Review of Systems  Constitutional: Negative for activity change, appetite change, chills, diaphoresis, fatigue and unexpected weight change.  Respiratory: Negative for cough, choking and shortness of breath.   Cardiovascular: Negative for chest pain and leg swelling.  Gastrointestinal: Positive for constipation. Negative for abdominal distention and abdominal pain.       + cramping, ? MSK  Genitourinary: Negative for difficulty urinating and dysuria.  Musculoskeletal: Positive for arthralgias, back pain and gait problem. Negative  for joint swelling.  Skin: Negative for color change and pallor.  Neurological: Negative for dizziness, weakness, light-headedness and headaches.  Psychiatric/Behavioral: Negative for confusion and decreased concentration.       Objective:   Physical Exam Constitutional:      General: She is not in acute distress.    Appearance: Normal appearance. She is obese. She is not toxic-appearing.  HENT:     Head: Normocephalic and atraumatic.  Eyes:     General: No scleral icterus.    Conjunctiva/sclera: Conjunctivae normal.  Cardiovascular:     Rate and Rhythm: Normal rate and regular rhythm.     Heart sounds: No murmur.  Pulmonary:     Effort: Pulmonary effort is normal. No respiratory distress.  Musculoskeletal:        General: No tenderness.     Right lower leg: No edema.     Left lower leg: No edema.  Skin:    General: Skin is warm and dry.  Neurological:     Mental Status: She is alert.  Psychiatric:        Mood and Affect: Mood normal.        Behavior: Behavior normal.     BMET today for cramping and CKD follow up.       Assessment & Plan:  RTC in 4 months

## 2019-05-16 ENCOUNTER — Other Ambulatory Visit: Payer: Self-pay | Admitting: Internal Medicine

## 2019-05-16 LAB — BMP8+ANION GAP
Anion Gap: 18 mmol/L (ref 10.0–18.0)
BUN/Creatinine Ratio: 17 (ref 12–28)
BUN: 29 mg/dL — ABNORMAL HIGH (ref 8–27)
CO2: 20 mmol/L (ref 20–29)
Calcium: 8.3 mg/dL — ABNORMAL LOW (ref 8.7–10.3)
Chloride: 100 mmol/L (ref 96–106)
Creatinine, Ser: 1.73 mg/dL — ABNORMAL HIGH (ref 0.57–1.00)
GFR calc Af Amer: 35 mL/min/{1.73_m2} — ABNORMAL LOW (ref >59)
GFR calc non Af Amer: 30 mL/min/{1.73_m2} — ABNORMAL LOW (ref >59)
Glucose: 100 mg/dL — ABNORMAL HIGH (ref 65–99)
Potassium: 4 mmol/L (ref 3.5–5.2)
Sodium: 138 mmol/L (ref 134–144)

## 2019-05-20 ENCOUNTER — Encounter: Payer: Self-pay | Admitting: Internal Medicine

## 2019-05-26 ENCOUNTER — Other Ambulatory Visit: Payer: Self-pay | Admitting: Internal Medicine

## 2019-05-26 ENCOUNTER — Other Ambulatory Visit: Payer: Self-pay | Admitting: *Deleted

## 2019-05-26 DIAGNOSIS — M810 Age-related osteoporosis without current pathological fracture: Secondary | ICD-10-CM

## 2019-05-26 NOTE — Patient Outreach (Signed)
[facility]) Care Management  05/26/2019  Vanessa Palmer 10/16/1950 [id number]   RN Health Coach attempted follow up outreach call to patient.  Patient was unavailable. HIPPA compliance voicemail message left with return callback number.  Plan: RN will call patient again within 30 days.  Gean Maidens BSN RN [facility] Care Management [phone]

## 2019-05-27 ENCOUNTER — Other Ambulatory Visit: Payer: Self-pay | Admitting: Internal Medicine

## 2019-05-27 DIAGNOSIS — E2839 Other primary ovarian failure: Secondary | ICD-10-CM

## 2019-05-29 ENCOUNTER — Ambulatory Visit
Admission: RE | Admit: 2019-05-29 | Discharge: 2019-05-29 | Disposition: A | Discharge status: Home / Self Care | Payer: Medicare Other | Source: Ambulatory Visit | Attending: Internal Medicine | Admitting: Internal Medicine

## 2019-05-29 ENCOUNTER — Other Ambulatory Visit: Payer: Self-pay

## 2019-05-29 DIAGNOSIS — E2839 Other primary ovarian failure: Secondary | ICD-10-CM

## 2019-05-29 DIAGNOSIS — Z780 Asymptomatic menopausal state: Secondary | ICD-10-CM | POA: Diagnosis not present

## 2019-06-02 ENCOUNTER — Encounter: Payer: Self-pay | Admitting: Internal Medicine

## 2019-06-02 MED ORDER — SODIUM BICARBONATE 650 MG PO TABS: 650.0000 mg | ORAL_TABLET | Freq: Every day | ORAL | 1 refills | Status: DC

## 2019-06-02 NOTE — Addendum Note (Signed)
Addended by: [name] B on: 06/02/2019 09:53 AM   Modules accepted: Orders

## 2019-06-09 ENCOUNTER — Telehealth: Payer: Self-pay | Admitting: *Deleted

## 2019-06-09 NOTE — Telephone Encounter (Signed)
-----   Message from Inez Catalina, MD sent at 06/02/2019  9:52 AM EDT ----- Attempted to call patient.  Left VM on identified voicemail to call back.   Triage -   If patient calls back, can you tell her the following - -  Bloodwork shows that her kidney function is a little bit worse.  She needs to start taking Sodium Bicarbonate once per day.  Dr. Criselda Peaches will send in a prescription.  She needs to schedule an appointment in 4-6 weeks with Dr. Criselda Peaches to get blood work.   Thank you!

## 2019-06-09 NOTE — Telephone Encounter (Signed)
Called pt - no answer; left message on self-identified vm to call the office. 

## 2019-06-09 NOTE — Telephone Encounter (Signed)
Pt return call, pls contact pt [phone]

## 2019-06-09 NOTE — Telephone Encounter (Signed)
Great! Thank you so much!

## 2019-06-09 NOTE — Telephone Encounter (Signed)
Called pt - I asked if she had received sodium bicarbonate from [facility]; she stated she has not. Stated she will call the pharmacy - I gave her the name (spelling) and dosage so she will have the correct info when she calls. Also informed she needs to schedule an appt in 4-6 weeks; stated she will call back ("I promise".

## 2019-06-16 ENCOUNTER — Other Ambulatory Visit: Payer: Self-pay

## 2019-06-16 DIAGNOSIS — M2550 Pain in unspecified joint: Secondary | ICD-10-CM

## 2019-06-16 NOTE — Telephone Encounter (Signed)
oxyCODONE-acetaminophen (PERCOCET) 10-325 MG tablet(Expired), REFILL REQUEST @  [id number] - [place], [place] - [address] AT [facility] OF [address] [phone] (Phone) [phone] (Fax)

## 2019-06-17 NOTE — Telephone Encounter (Signed)
Pt checking on RX, states she called pharmacy and was told Percocet was not at pharmacy, but another RX was there.  RN informed pt RX request for Percocet was sent to PCP, but has not been approved by PCP yet.  Pt notified that MD's were given 48 hours to respond to RX request and to check with pharmacy again tomorrow.  She was appreciative and verbalized understanding. SChaplin, RN,BSN

## 2019-06-18 MED ORDER — OXYCODONE-ACETAMINOPHEN 10-325 MG PO TABS: 1.0000 | ORAL_TABLET | Freq: Three times a day (TID) | ORAL | 0 refills | Status: DC | PRN

## 2019-06-25 ENCOUNTER — Other Ambulatory Visit: Payer: Self-pay | Admitting: *Deleted

## 2019-06-25 NOTE — Patient Outreach (Signed)
[facility]) Care Management  06/25/2019  Vanessa Palmer 1951/02/02 [id number]  RN Health Coach telephone call to patient.  Hipaa compliance verified. Per patient she has been checking her blood pressure off and on. Per patient she is waiting on her transportation going somewhere and requested a call back next week.  Plan: RN will reschedule for next week  Gean Maidens BSN RN [facility] Care Management [phone]

## 2019-06-29 DIAGNOSIS — M1990 Unspecified osteoarthritis, unspecified site: Secondary | ICD-10-CM | POA: Diagnosis not present

## 2019-06-29 DIAGNOSIS — M2550 Pain in unspecified joint: Secondary | ICD-10-CM | POA: Diagnosis not present

## 2019-06-29 DIAGNOSIS — G4733 Obstructive sleep apnea (adult) (pediatric): Secondary | ICD-10-CM | POA: Diagnosis not present

## 2019-06-30 ENCOUNTER — Other Ambulatory Visit: Payer: Self-pay | Admitting: Internal Medicine

## 2019-06-30 DIAGNOSIS — E785 Hyperlipidemia, unspecified: Secondary | ICD-10-CM

## 2019-07-01 ENCOUNTER — Other Ambulatory Visit: Payer: Self-pay | Admitting: *Deleted

## 2019-07-01 NOTE — Patient Outreach (Signed)
[facility]) Care Management  07/01/2019  Vanessa Palmer 13-Jan-1951 [id number]  RN Health Coach attempted follow up outreach call to patient.  Patient was unavailable. HIPPA compliance voicemail message left with return callback number.  Plan: RN will call patient again within 30 days.  Gean Maidens BSN RN [facility] Care Management [phone]

## 2019-07-09 ENCOUNTER — Ambulatory Visit (HOSPITAL_COMMUNITY)
Admission: RE | Admit: 2019-07-09 | Discharge: 2019-07-09 | Disposition: A | Discharge status: Home / Self Care | Payer: Medicare Other | Source: Ambulatory Visit | Attending: Internal Medicine | Admitting: Internal Medicine

## 2019-07-09 ENCOUNTER — Ambulatory Visit (INDEPENDENT_AMBULATORY_CARE_PROVIDER_SITE_OTHER): Payer: Medicare Other | Admitting: Internal Medicine

## 2019-07-09 ENCOUNTER — Encounter: Payer: Self-pay | Admitting: Internal Medicine

## 2019-07-09 ENCOUNTER — Other Ambulatory Visit: Payer: Self-pay

## 2019-07-09 VITALS — BP 145/88 | HR 81 | Temp 98.4°F | Ht 62.0 in | Wt 283.3 lb

## 2019-07-09 DIAGNOSIS — M79604 Pain in right leg: Secondary | ICD-10-CM | POA: Insufficient documentation

## 2019-07-09 DIAGNOSIS — M1A9XX0 Chronic gout, unspecified, without tophus (tophi): Secondary | ICD-10-CM | POA: Diagnosis not present

## 2019-07-09 DIAGNOSIS — M79671 Pain in right foot: Secondary | ICD-10-CM | POA: Diagnosis not present

## 2019-07-09 NOTE — Progress Notes (Signed)
Lower extremity venous has been completed.   Preliminary results in CV Proc.   Blanch Media 07/09/2019 3:18 PM

## 2019-07-09 NOTE — Assessment & Plan Note (Signed)
Patient states that over the past 3 weeks her right foot has been aching. She describes the pain as being present over the entire right foot and radiated upto the calf. It is a 8/10 intensity, constant, alleviated with percocet, aggravated when she walks on the foot. She denies any trauma to the foot. Of note, she states that her left leg is swollen, but does not have any pain there.    The patient stated that it feels like a gout flare. Her last gout flare was approximately 10 yrs ago.  On physical exam the patient's left upper leg diameter was 49 cm in size and right upper leg was 50.  Her left lower extremity was also firm and very tender to palpation.  The patient was currently seated in a motor wheelchair.  She states that she uses a cane to ambulate in her house.  She has not had any recent trauma, surgeries.  Assessment and plan Patient signs and symptoms are concerning for possible lower extremity DVT.  She has had DVTs in the past and she is currently taking Xarelto.  Will send for a stat lower extremity DVT study.  The pain is less likely to be secondary to gout as there is no single joint involvement, warmth, erythema, effusion present.  Of note the patient has had a history of chronic pain in several joints for which she receives opioid pain medications.

## 2019-07-09 NOTE — Patient Instructions (Addendum)
It was a pleasure to see you today Vanessa Palmer. Please make the following changes:   I am sorry to hear about your right leg pain. I am concerned about a clot in your leg. Please get bilateral lower extremity ultrasound done. Please continue to use percocet as needed for pain. I will call you with results   If you have any questions or concerns, please call our clinic at [phone] between 9am-5pm and after hours call [phone] and ask for the internal medicine resident on call. If you feel you are having a medical emergency please call 911.   Thank you, we look forward to help you remain healthy!  Lorenso Courier, MD Internal Medicine PGY3    To schedule an appointment for a COVID vaccine or be added to the vaccine wait list: Go to [email or web address]   OR Go to [email or web address]                  OR Call [phone]                                     OR Call [phone] and select Option 2

## 2019-07-09 NOTE — Progress Notes (Signed)
   CC: Right leg pain  HPI:  Vanessa Palmer is a 69 y.o. with essential hypertension, CKD 3, obesity who presents for evaluation of right leg pain. Please see problem based charting for evaluation, assessment, and plan.   Past Medical History:  Diagnosis Date  . ALCOHOL ABUSE 12/13/2005   Annotation: Sober since 11/06 Qualifier: Diagnosis of  By: Wallace Cullens MD, Natalia Leatherwood    . Anemia   . Arthritis    "all over" (02/21/2017)  . CAD (coronary artery disease)    s/p non-Q wave MI in 06/2001 and 2004, 2005  . CHF (congestive heart failure) (HCC)   . Chronic kidney disease (CKD), stage III (moderate) 09/19/2010  . Chronic mid back pain   . Depression   . DJD (degenerative joint disease)   . DVT (deep venous thrombosis) (HCC)    BLE  . DVT of lower extremity, bilateral (HCC) 04/04/2012   On Xarelto    . GERD (gastroesophageal reflux disease)   . Gout   . Hyperlipidemia   . Hypertension   . INTRINSIC ASTHMA, WITH EXACERBATION 09/27/2009   Qualifier: Diagnosis of  By: Denton Meek MD, Tillie Rung    . Migraine    "none anymore" (02/21/2017)  . Myocardial infarction [facility]) ?2005  . Obesity   . OSA (obstructive sleep apnea)    previously used CPAP, has lost it (02/21/2017)  . Seizures (HCC)    As a teenager    Review of Systems:    Denies nausea, vomiting, fevers, chills, headaches   Physical Exam:  Vitals:   07/09/19 0831 07/09/19 0832  BP:  (!) 145/88  Pulse:  81  Temp:  98.4 F (36.9 C)  TempSrc:  Oral  SpO2:  98%  Weight: 283 lb 4.8 oz (128.5 kg)   Height: 5\' 2"  (1.575 m)    Physical Exam  Constitutional: She appears well-developed and well-nourished. No distress.  Sitting in motor wheelchair  HENT:  Head: Normocephalic and atraumatic.  Cardiovascular: Normal rate, regular rhythm and normal heart sounds.  Respiratory: Effort normal and breath sounds normal. No respiratory distress. She has no wheezes.  Musculoskeletal:        General: Edema present.     Comments: Firm woody  right lower extremity from right foot upto right knee. 54cm in diameter on right upper calf in comparison to 49cm in left upper calf   Neurological: She is alert.  Skin: She is not diaphoretic.  Psychiatric: She has a normal mood and affect. Her behavior is normal. Judgment and thought content normal.    Assessment & Plan:   See Encounters Tab for problem based charting.  Patient discussed with Dr. 

## 2019-07-10 ENCOUNTER — Ambulatory Visit: Payer: Medicare Other

## 2019-07-10 NOTE — Progress Notes (Signed)
Internal Medicine Clinic Attending  Case discussed with Dr. Chundi at the time of the visit.  We reviewed the resident's history and exam and pertinent patient test results.  I agree with the assessment, diagnosis, and plan of care documented in the resident's note.  Jacqualynn Parco, M.D., Ph.D.  

## 2019-07-13 ENCOUNTER — Ambulatory Visit (INDEPENDENT_AMBULATORY_CARE_PROVIDER_SITE_OTHER): Payer: Medicare Other | Admitting: Internal Medicine

## 2019-07-13 ENCOUNTER — Other Ambulatory Visit: Payer: Self-pay

## 2019-07-13 ENCOUNTER — Encounter: Payer: Self-pay | Admitting: Internal Medicine

## 2019-07-13 VITALS — BP 148/101 | HR 70 | Temp 98.0°F | Ht 62.0 in | Wt 279.7 lb

## 2019-07-13 DIAGNOSIS — M7989 Other specified soft tissue disorders: Secondary | ICD-10-CM | POA: Diagnosis not present

## 2019-07-13 DIAGNOSIS — M79606 Pain in leg, unspecified: Secondary | ICD-10-CM

## 2019-07-13 DIAGNOSIS — R600 Localized edema: Secondary | ICD-10-CM

## 2019-07-13 DIAGNOSIS — R635 Abnormal weight gain: Secondary | ICD-10-CM | POA: Diagnosis not present

## 2019-07-13 NOTE — Patient Instructions (Signed)
It was a pleasure to see you today Ms. Welcome. Please make the following changes:  I am sorry to hear about your lower extremity swelling. I have ordered some blood and urine tests along with a cardiac echo to investigate why this is occurring.   If you have any questions or concerns, please call our clinic at [phone] between 9am-5pm and after hours call [phone] and ask for the internal medicine resident on call. If you feel you are having a medical emergency please call 911.   Thank you, we look forward to help you remain healthy!  Lorenso Courier, MD Internal Medicine PGY3

## 2019-07-13 NOTE — Assessment & Plan Note (Signed)
  The patient presents for follow-up of lower extremity edema and pain.  She continues to have swelling bilateral lower extremities right>left.  She is also gained approximately 10 pounds since March 2021.  No respiratory distress.  Patient's last echo was in January 2020 which showed EF 65-70%, G1DD, calcified mitral annulus, no regional wall motion abnormalities.  Patient's last UA was in 2017 which showed no proteinuria at that time. Patient had lower extremity DVT study which was negative on 07/09/2019.  Assessment and plan We will evaluate the patient for heart failure versus nephrotic syndrome with TTE, UA.  It is possible that patient's morbid obesity is also contributing to lower extremity edema.  Patient is currently taking Lasix 40 mg daily.  Will check BMP and consider increase in frequency of Lasix accordingly.  Follow-up in 2 weeks.

## 2019-07-13 NOTE — Progress Notes (Signed)
   CC: Lower extremity swelling  HPI:  Ms.Charmelle D Kuyper is a 69 y.o. with essential hypertension, CKD 3, OSA, chronic asthma who presents for follow-up evaluation of unilateral lower extremity swelling. Please see problem based charting for evaluation, assessment, and plan.  Past Medical History:  Diagnosis Date  . ALCOHOL ABUSE 12/13/2005   Annotation: Sober since 11/06 Qualifier: Diagnosis of  By: Wallace Cullens MD, Natalia Leatherwood    . Anemia   . Arthritis    "all over" (02/21/2017)  . CAD (coronary artery disease)    s/p non-Q wave MI in 06/2001 and 2004, 2005  . CHF (congestive heart failure) (HCC)   . Chronic kidney disease (CKD), stage III (moderate) 09/19/2010  . Chronic mid back pain   . Depression   . DJD (degenerative joint disease)   . DVT (deep venous thrombosis) (HCC)    BLE  . DVT of lower extremity, bilateral (HCC) 04/04/2012   On Xarelto    . GERD (gastroesophageal reflux disease)   . Gout   . Hyperlipidemia   . Hypertension   . INTRINSIC ASTHMA, WITH EXACERBATION 09/27/2009   Qualifier: Diagnosis of  By: Denton Meek MD, Tillie Rung    . Migraine    "none anymore" (02/21/2017)  . Myocardial infarction [facility]) ?2005  . Obesity   . OSA (obstructive sleep apnea)    previously used CPAP, has lost it (02/21/2017)  . Seizures (HCC)    As a teenager    Review of Systems:    Denies nausea, vomiting, fever, chills  Physical Exam:  Vitals:   07/13/19 1012 07/13/19 1035  BP: (!) 165/93 (!) 148/101  Pulse: 77 70  Temp: 98 F (36.7 C)   TempSrc: Oral   SpO2: 100%   Weight: 279 lb 11.2 oz (126.9 kg)   Height: 5\' 2"  (1.575 m)    Physical Exam  Constitutional: She is oriented to person, place, and time. She appears well-developed and well-nourished. No distress.  HENT:  Head: Normocephalic and atraumatic.  Eyes: Conjunctivae are normal.  Cardiovascular: Normal rate, regular rhythm and normal heart sounds.  Respiratory: Effort normal and breath sounds normal. No respiratory distress.  She has no wheezes.  GI: Soft. Bowel sounds are normal. She exhibits no distension. There is no abdominal tenderness.  Musculoskeletal:        General: No edema (52cm rle diffusely down to foot, 49cm lle with 2+ pitting).  Neurological: She is alert and oriented to person, place, and time.  Skin: She is not diaphoretic.     Assessment & Plan:   See Encounters Tab for problem based charting.  Patient discussed with Dr. 

## 2019-07-14 LAB — MICROSCOPIC EXAMINATION
Casts: NONE SEEN /lpf
Epithelial Cells (non renal): NONE SEEN /hpf (ref 0–10)
RBC, Urine: NONE SEEN /hpf (ref 0–2)

## 2019-07-14 LAB — URINALYSIS, ROUTINE W REFLEX MICROSCOPIC
Bilirubin, UA: NEGATIVE
Glucose, UA: NEGATIVE
Ketones, UA: NEGATIVE
Nitrite, UA: NEGATIVE
Protein,UA: NEGATIVE
RBC, UA: NEGATIVE
Specific Gravity, UA: 1.008 (ref 1.005–1.030)
Urobilinogen, Ur: 1 mg/dL (ref 0.2–1.0)
pH, UA: 6.5 (ref 5.0–7.5)

## 2019-07-14 LAB — CMP14 + ANION GAP
ALT: 7 IU/L (ref 0–32)
AST: 17 IU/L (ref 0–40)
Albumin/Globulin Ratio: 1.3 (ref 1.2–2.2)
Albumin: 4.1 g/dL (ref 3.8–4.8)
Alkaline Phosphatase: 149 IU/L — ABNORMAL HIGH (ref 48–121)
Anion Gap: 15 mmol/L (ref 10.0–18.0)
BUN/Creatinine Ratio: 12 (ref 12–28)
BUN: 15 mg/dL (ref 8–27)
Bilirubin Total: 0.7 mg/dL (ref 0.0–1.2)
CO2: 22 mmol/L (ref 20–29)
Calcium: 8.4 mg/dL — ABNORMAL LOW (ref 8.7–10.3)
Chloride: 103 mmol/L (ref 96–106)
Creatinine, Ser: 1.21 mg/dL — ABNORMAL HIGH (ref 0.57–1.00)
GFR calc Af Amer: 54 mL/min/{1.73_m2} — ABNORMAL LOW (ref >59)
GFR calc non Af Amer: 47 mL/min/{1.73_m2} — ABNORMAL LOW (ref >59)
Globulin, Total: 3.2 g/dL (ref 1.5–4.5)
Glucose: 97 mg/dL (ref 65–99)
Potassium: 3.4 mmol/L — ABNORMAL LOW (ref 3.5–5.2)
Sodium: 140 mmol/L (ref 134–144)
Total Protein: 7.3 g/dL (ref 6.0–8.5)

## 2019-07-14 NOTE — Progress Notes (Signed)
Internal Medicine Clinic Attending  Case discussed with Dr. Chundi at the time of the visit.  We reviewed the resident's history and exam and pertinent patient test results.  I agree with the assessment, diagnosis, and plan of care documented in the resident's note.  Briggitte Boline, M.D., Ph.D.  

## 2019-07-15 ENCOUNTER — Other Ambulatory Visit: Payer: Self-pay | Admitting: Internal Medicine

## 2019-07-15 ENCOUNTER — Other Ambulatory Visit: Payer: Self-pay

## 2019-07-15 ENCOUNTER — Other Ambulatory Visit: Payer: Self-pay | Admitting: *Deleted

## 2019-07-15 DIAGNOSIS — M2550 Pain in unspecified joint: Secondary | ICD-10-CM

## 2019-07-15 MED ORDER — OXYCODONE-ACETAMINOPHEN 10-325 MG PO TABS: 1.0000 | ORAL_TABLET | Freq: Three times a day (TID) | ORAL | 0 refills | Status: DC | PRN

## 2019-07-15 MED ORDER — FLUTICASONE PROPIONATE 50 MCG/ACT NA SUSP: NASAL | 1 refills | Status: DC

## 2019-07-15 NOTE — Telephone Encounter (Signed)
Also requesting refill on Flonase.Thanks

## 2019-07-15 NOTE — Telephone Encounter (Signed)
oxyCODONE-acetaminophen (PERCOCET) 10-325 MG tablet and nasal spray to be filled @  [facility] #[id number] - [place], [place] - [address] AT [facility] OF [address] [phone] (Phone) [phone] (Fax)

## 2019-07-15 NOTE — Telephone Encounter (Signed)
Called pt - stated she needs spray for allergies; she has been on Flonase.  Last rx written 06/18/19. Last OV 5/17 in [facility]. Next OV 6/16 with PCP. UDS 12/04/17.

## 2019-07-20 ENCOUNTER — Ambulatory Visit (HOSPITAL_COMMUNITY)
Admission: RE | Admit: 2019-07-20 | Discharge: 2019-07-20 | Disposition: A | Discharge status: Home / Self Care | Payer: Medicare Other | Source: Ambulatory Visit | Attending: Internal Medicine | Admitting: Internal Medicine

## 2019-07-20 ENCOUNTER — Other Ambulatory Visit: Payer: Self-pay

## 2019-07-20 DIAGNOSIS — I1310 Hypertensive heart and chronic kidney disease without heart failure, with stage 1 through stage 4 chronic kidney disease, or unspecified chronic kidney disease: Secondary | ICD-10-CM | POA: Diagnosis not present

## 2019-07-20 DIAGNOSIS — E785 Hyperlipidemia, unspecified: Secondary | ICD-10-CM | POA: Insufficient documentation

## 2019-07-20 DIAGNOSIS — M7989 Other specified soft tissue disorders: Secondary | ICD-10-CM | POA: Insufficient documentation

## 2019-07-20 DIAGNOSIS — I425 Other restrictive cardiomyopathy: Secondary | ICD-10-CM | POA: Diagnosis not present

## 2019-07-20 DIAGNOSIS — I361 Nonrheumatic tricuspid (valve) insufficiency: Secondary | ICD-10-CM | POA: Diagnosis not present

## 2019-07-20 DIAGNOSIS — I340 Nonrheumatic mitral (valve) insufficiency: Secondary | ICD-10-CM | POA: Diagnosis not present

## 2019-07-20 DIAGNOSIS — Z86718 Personal history of other venous thrombosis and embolism: Secondary | ICD-10-CM | POA: Diagnosis not present

## 2019-07-20 DIAGNOSIS — N189 Chronic kidney disease, unspecified: Secondary | ICD-10-CM | POA: Insufficient documentation

## 2019-07-20 DIAGNOSIS — I351 Nonrheumatic aortic (valve) insufficiency: Secondary | ICD-10-CM | POA: Insufficient documentation

## 2019-07-20 DIAGNOSIS — M79606 Pain in leg, unspecified: Secondary | ICD-10-CM | POA: Diagnosis not present

## 2019-07-20 DIAGNOSIS — I700 Atherosclerosis of aorta: Secondary | ICD-10-CM | POA: Diagnosis not present

## 2019-07-20 DIAGNOSIS — R072 Precordial pain: Secondary | ICD-10-CM | POA: Diagnosis not present

## 2019-07-20 NOTE — Progress Notes (Signed)
  Echocardiogram 2D Echocardiogram has been performed.  Gerda Diss 07/20/2019, 3:44 PM

## 2019-07-21 ENCOUNTER — Other Ambulatory Visit: Payer: Self-pay | Admitting: Internal Medicine

## 2019-07-23 ENCOUNTER — Ambulatory Visit (INDEPENDENT_AMBULATORY_CARE_PROVIDER_SITE_OTHER): Payer: Medicare Other | Admitting: Internal Medicine

## 2019-07-23 VITALS — BP 142/80 | HR 70 | Temp 98.1°F | Ht 62.0 in | Wt 278.5 lb

## 2019-07-23 DIAGNOSIS — R600 Localized edema: Secondary | ICD-10-CM | POA: Diagnosis not present

## 2019-07-23 NOTE — Assessment & Plan Note (Signed)
Patient presents for follow-up of lower extremity swelling.  His weight has been stable (today at 278lb). Patient had a TTE done on 07/20/2019 which showed EF 60-65%, no regional wall motion abnormalities, grade 1 diastolic dysfunction, mild grade 2 atheroma plaque in the ascending aorta and aortic root.  UA did not show any presence of proteinuria.   Assessment and plan During this visit the patient stated that the pain in her lower extremities had improved. On examination the patient's bilateral lower extremities were not edematous and were symmetric. Ordered ABIs to evaluate for peripheral artery disease. ABIs were within normal range. It is possible that the patient has venous insufficiency and therefore encouraged the patient to use compression stockings.

## 2019-07-23 NOTE — Patient Instructions (Signed)
It was a pleasure to see you today Vanessa Palmer. Please make the following changes:  Your leg swelling may have been from venous insufficiency. We checked you for arterial blockage today in clinic.  -please use compression stockings when you notice swelling again -arrange follow up with your pcp   If you have any questions or concerns, please call our clinic at [phone] between 9am-5pm and after hours call [phone] and ask for the internal medicine resident on call. If you feel you are having a medical emergency please call 911.   Thank you, we look forward to help you remain healthy!  Lorenso Courier, MD Internal Medicine PGY3   To schedule an appointment for a COVID vaccine or be added to the vaccine wait list: Go to [email or web address]   OR Go to [email or web address]                  OR Call [phone]                                     OR Call [phone] and select Option 2  [address] [facility] [place]) [place] [address] [facility] [place], [place] [address] Hours: Mon,Thu 8-5, Sat 8-12 Type: Pfizer (12+)  [address]  [facility] [place]) [place] [employer] [address] [place], [place] [address] Hours: Thu: 1-5 Type: Pfizer (12+)

## 2019-07-23 NOTE — Progress Notes (Signed)
   CC: unilateral lower extremity swelling  HPI:  [name] is a 69 y.o. with essential hypertension, OSA, chronic asthma,CKD 3 who presents for follow-up of unilateral lower extremity swelling. Please see problem based charting for evaluation, assessment, and plan.    Past Medical History:  Diagnosis Date  . ALCOHOL ABUSE 12/13/2005   Annotation: Sober since 11/06 Qualifier: Diagnosis of  By: Wallace Cullens MD, Natalia Leatherwood    . Anemia   . Arthritis    "all over" (02/21/2017)  . CAD (coronary artery disease)    s/p non-Q wave MI in 06/2001 and 2004, 2005  . CHF (congestive heart failure) (HCC)   . Chronic kidney disease (CKD), stage III (moderate) 09/19/2010  . Chronic mid back pain   . Depression   . DJD (degenerative joint disease)   . DVT (deep venous thrombosis) (HCC)    BLE  . DVT of lower extremity, bilateral (HCC) 04/04/2012   On Xarelto    . GERD (gastroesophageal reflux disease)   . Gout   . Hyperlipidemia   . Hypertension   . INTRINSIC ASTHMA, WITH EXACERBATION 09/27/2009   Qualifier: Diagnosis of  By: Denton Meek MD, Tillie Rung    . Migraine    "none anymore" (02/21/2017)  . Myocardial infarction [facility]) ?2005  . Obesity   . OSA (obstructive sleep apnea)    previously used CPAP, has lost it (02/21/2017)  . Seizures (HCC)    As a teenager    Review of Systems:    Denies any nausea, vomiting, cough, dyspnea  Physical Exam:  Vitals:   07/23/19 0949  BP: (!) 142/80  Pulse: 70  Temp: 98.1 F (36.7 C)  TempSrc: Oral  SpO2: 99%  Weight: 278 lb 8 oz (126.3 kg)  Height: 5\' 2"  (1.575 m)   Physical Exam  Constitutional: She appears well-developed and well-nourished. No distress.  HENT:  Head: Normocephalic and atraumatic.  Cardiovascular: Normal rate, regular rhythm and normal heart sounds.  Respiratory: Effort normal and breath sounds normal. No respiratory distress. She has no wheezes.  GI: Soft. Bowel sounds are normal.  Musculoskeletal:        General: No edema.       Comments: Bilateral proximal lower extremity 47cm in diameter  Skin: She is not diaphoretic.  Psychiatric: She has a normal mood and affect. Her behavior is normal. Judgment and thought content normal.      Assessment & Plan:   See Encounters Tab for problem based charting.  Patient discussed with Dr. 

## 2019-07-28 NOTE — Progress Notes (Signed)
Internal Medicine Clinic Attending  Case discussed with Dr. Chundi at the time of the visit.  We reviewed the resident's history and exam and pertinent patient test results.  I agree with the assessment, diagnosis, and plan of care documented in the resident's note. 

## 2019-07-30 ENCOUNTER — Other Ambulatory Visit: Payer: Self-pay | Admitting: *Deleted

## 2019-07-30 NOTE — Patient Outreach (Signed)
[facility]) Care Management  07/30/2019  ARMELLA STOGNER 1950/12/31 [id number]  RN Health Coach attempted follow up outreach call to patient.  Patient was unavailable. HIPPA compliance voicemail message left with return callback number.  Plan: RN will call patient again within 30 days. Unsuccessful outreach letter sent  Gean Maidens BSN RN [facility] Care Management [phone]

## 2019-08-05 ENCOUNTER — Other Ambulatory Visit: Payer: Self-pay | Admitting: Internal Medicine

## 2019-08-09 ENCOUNTER — Other Ambulatory Visit: Payer: Self-pay

## 2019-08-09 ENCOUNTER — Emergency Department (HOSPITAL_COMMUNITY): Payer: Medicare Other

## 2019-08-09 ENCOUNTER — Emergency Department (HOSPITAL_COMMUNITY)
Admission: EM | Admit: 2019-08-09 | Discharge: 2019-08-10 | Disposition: A | Discharge status: Home / Self Care | Payer: Medicare Other | Attending: Emergency Medicine | Admitting: Emergency Medicine

## 2019-08-09 ENCOUNTER — Encounter (HOSPITAL_COMMUNITY): Payer: Self-pay | Admitting: Emergency Medicine

## 2019-08-09 DIAGNOSIS — Z79899 Other long term (current) drug therapy: Secondary | ICD-10-CM | POA: Insufficient documentation

## 2019-08-09 DIAGNOSIS — I130 Hypertensive heart and chronic kidney disease with heart failure and stage 1 through stage 4 chronic kidney disease, or unspecified chronic kidney disease: Secondary | ICD-10-CM | POA: Diagnosis not present

## 2019-08-09 DIAGNOSIS — J45909 Unspecified asthma, uncomplicated: Secondary | ICD-10-CM | POA: Insufficient documentation

## 2019-08-09 DIAGNOSIS — I10 Essential (primary) hypertension: Secondary | ICD-10-CM | POA: Diagnosis not present

## 2019-08-09 DIAGNOSIS — R109 Unspecified abdominal pain: Secondary | ICD-10-CM | POA: Diagnosis not present

## 2019-08-09 DIAGNOSIS — R5383 Other fatigue: Secondary | ICD-10-CM

## 2019-08-09 DIAGNOSIS — N3001 Acute cystitis with hematuria: Secondary | ICD-10-CM | POA: Insufficient documentation

## 2019-08-09 DIAGNOSIS — N183 Chronic kidney disease, stage 3 unspecified: Secondary | ICD-10-CM | POA: Insufficient documentation

## 2019-08-09 DIAGNOSIS — I2510 Atherosclerotic heart disease of native coronary artery without angina pectoris: Secondary | ICD-10-CM | POA: Diagnosis not present

## 2019-08-09 DIAGNOSIS — I509 Heart failure, unspecified: Secondary | ICD-10-CM | POA: Diagnosis not present

## 2019-08-09 LAB — COMPREHENSIVE METABOLIC PANEL
ALT: 15 U/L (ref 0–44)
AST: 23 U/L (ref 15–41)
Albumin: 3.6 g/dL (ref 3.5–5.0)
Alkaline Phosphatase: 89 U/L (ref 38–126)
Anion gap: 11 (ref 5–15)
BUN: 12 mg/dL (ref 8–23)
CO2: 24 mmol/L (ref 22–32)
Calcium: 8.5 mg/dL — ABNORMAL LOW (ref 8.9–10.3)
Chloride: 105 mmol/L (ref 98–111)
Creatinine, Ser: 1.37 mg/dL — ABNORMAL HIGH (ref 0.44–1.00)
GFR calc Af Amer: 46 mL/min — ABNORMAL LOW (ref >60)
GFR calc non Af Amer: 40 mL/min — ABNORMAL LOW (ref >60)
Glucose, Bld: 116 mg/dL — ABNORMAL HIGH (ref 70–99)
Potassium: 3.4 mmol/L — ABNORMAL LOW (ref 3.5–5.1)
Sodium: 140 mmol/L (ref 135–145)
Total Bilirubin: 1.2 mg/dL (ref 0.3–1.2)
Total Protein: 7.9 g/dL (ref 6.5–8.1)

## 2019-08-09 LAB — URINALYSIS, ROUTINE W REFLEX MICROSCOPIC
Bilirubin Urine: NEGATIVE
Glucose, UA: NEGATIVE mg/dL
Ketones, ur: NEGATIVE mg/dL
Nitrite: POSITIVE — AB
Protein, ur: >=300 mg/dL — AB
Specific Gravity, Urine: 1.017 (ref 1.005–1.030)
WBC, UA: >50 WBC/hpf — ABNORMAL HIGH (ref 0–5)
pH: 6 (ref 5.0–8.0)

## 2019-08-09 LAB — CBC
HCT: 37.9 % (ref 36.0–46.0)
Hemoglobin: 12.5 g/dL (ref 12.0–15.0)
MCH: 32 pg (ref 26.0–34.0)
MCHC: 33 g/dL (ref 30.0–36.0)
MCV: 96.9 fL (ref 80.0–100.0)
Platelets: 201 10*3/uL (ref 150–400)
RBC: 3.91 MIL/uL (ref 3.87–5.11)
RDW: 13.2 % (ref 11.5–15.5)
WBC: 3.7 10*3/uL — ABNORMAL LOW (ref 4.0–10.5)
nRBC: 0 % (ref 0.0–0.2)

## 2019-08-09 LAB — TYPE AND SCREEN
ABO/RH(D): O POS
Antibody Screen: NEGATIVE

## 2019-08-09 LAB — ABO/RH: ABO/RH(D): O POS

## 2019-08-09 MED ORDER — IOHEXOL 300 MG/ML  SOLN
125.0000 mL | Freq: Once | INTRAMUSCULAR | Status: AC | PRN
  Administered 2019-08-09: 125 mL via INTRAVENOUS

## 2019-08-09 MED ORDER — AMLODIPINE BESYLATE 5 MG PO TABS: 10.0000 mg | ORAL_TABLET | Freq: Once | ORAL | Status: DC

## 2019-08-09 MED ORDER — DIPHENHYDRAMINE HCL 50 MG/ML IJ SOLN
25.0000 mg | Freq: Once | INTRAMUSCULAR | Status: AC
  Administered 2019-08-09: 25 mg via INTRAVENOUS
  Filled 2019-08-09: qty 1

## 2019-08-09 MED ORDER — ONDANSETRON 4 MG PO TBDP
4.0000 mg | ORAL_TABLET | Freq: Three times a day (TID) | ORAL | 0 refills | Status: DC | PRN
Start: 2019-08-09 — End: 2019-12-11

## 2019-08-09 MED ORDER — SODIUM CHLORIDE 0.9 % IV BOLUS
1000.0000 mL | Freq: Once | INTRAVENOUS | Status: AC
  Administered 2019-08-09: 1000 mL via INTRAVENOUS

## 2019-08-09 MED ORDER — ONDANSETRON HCL 4 MG/2ML IJ SOLN: 4.0000 mg | Freq: Once | INTRAMUSCULAR | Status: DC

## 2019-08-09 MED ORDER — SODIUM CHLORIDE 0.9 % IV SOLN
1.0000 g | Freq: Once | INTRAVENOUS | Status: AC
  Administered 2019-08-10: 1 g via INTRAVENOUS
  Filled 2019-08-09: qty 10

## 2019-08-09 MED ORDER — FAMOTIDINE IN NACL 20-0.9 MG/50ML-% IV SOLN
20.0000 mg | Freq: Once | INTRAVENOUS | Status: AC
  Administered 2019-08-09: 20 mg via INTRAVENOUS
  Filled 2019-08-09: qty 50

## 2019-08-09 MED ORDER — LOSARTAN POTASSIUM 50 MG PO TABS: 25.0000 mg | ORAL_TABLET | ORAL | Status: DC

## 2019-08-09 MED ORDER — CEPHALEXIN 500 MG PO CAPS
500.0000 mg | ORAL_CAPSULE | Freq: Four times a day (QID) | ORAL | 0 refills | Status: AC
Start: 2019-08-09 — End: 2019-08-16

## 2019-08-09 MED ORDER — MORPHINE SULFATE (PF) 4 MG/ML IV SOLN
4.0000 mg | Freq: Once | INTRAVENOUS | Status: AC
  Administered 2019-08-09: 4 mg via INTRAVENOUS
  Filled 2019-08-09: qty 1

## 2019-08-09 MED ORDER — DICYCLOMINE HCL 10 MG PO CAPS
20.0000 mg | ORAL_CAPSULE | Freq: Once | ORAL | Status: AC
  Administered 2019-08-09: 20 mg via ORAL
  Filled 2019-08-09: qty 2

## 2019-08-09 MED ORDER — METOCLOPRAMIDE HCL 5 MG/ML IJ SOLN
10.0000 mg | Freq: Once | INTRAMUSCULAR | Status: AC
  Administered 2019-08-09: 10 mg via INTRAVENOUS
  Filled 2019-08-09: qty 2

## 2019-08-09 NOTE — ED Provider Notes (Deleted)
e   Vanessa Palmer, [place] 08/13/19 1644

## 2019-08-09 NOTE — ED Notes (Signed)
POC Occult negative ?

## 2019-08-09 NOTE — Discharge Instructions (Addendum)
Please take antibiotics as prescribed.  Please follow-up with your primary care doctor after today's visit.  Please use Zofran as needed for nausea.

## 2019-08-09 NOTE — ED Triage Notes (Signed)
C/o generalized weakness, nausea, headache, diarrhea, and black stools since Thursday.

## 2019-08-09 NOTE — ED Provider Notes (Signed)
[facility] EMERGENCY DEPARTMENT Provider Note   CSN: [id number] Arrival date & time: 08/09/19  1824     History Chief Complaint  Patient presents with  . GI Bleeding  . Weakness    Vanessa Palmer is a 69 y.o. female.  HPI  Patient is a 69 year old female on DOAC anticoagulation with a history of anemia, arthritis, CAD, CHF, CKD, chronic back pain, depression, DJD, DVT, reflux, hyperlipidemia, HTN, morbid obesity, migraines  Patient is presented today with complaints of generalized fatigue.  She states that she has had 4 days of fatigue, nausea without vomiting, frontal headache, diarrhea and states that she has had some black stools.  She denies any recent antibiotic use denies any recent travel.  She denies any chest pain or shortness of breath.  She denies any bloody stool.  She states she did not take her blood pressure medications this morning because she was not feeling well.  She states that she has had an appetite but just has not felt like eating because of the nausea.  She states that she feels like as long as she does not feel nauseous she would like to eat.  She states she is has some right lower abdominal pain that also began Thursday morning and seems to have gotten worse since then.  She states it is sharp and stabbing.  With no aggravating or mitigating factors.  She has taken nothing for her symptoms prior to arrival.     Past Medical History:  Diagnosis Date  . ALCOHOL ABUSE 12/13/2005   Annotation: Sober since 11/06 Qualifier: Diagnosis of  By: Wallace Cullens MD, Natalia Leatherwood    . Anemia   . Arthritis    "all over" (02/21/2017)  . CAD (coronary artery disease)    s/p non-Q wave MI in 06/2001 and 2004, 2005  . CHF (congestive heart failure) (HCC)   . Chronic kidney disease (CKD), stage III (moderate) 09/19/2010  . Chronic mid back pain   . Depression   . DJD (degenerative joint disease)   . DVT (deep venous thrombosis) (HCC)    BLE  . DVT of lower  extremity, bilateral (HCC) 04/04/2012   On Xarelto    . GERD (gastroesophageal reflux disease)   . Gout   . Hyperlipidemia   . Hypertension   . INTRINSIC ASTHMA, WITH EXACERBATION 09/27/2009   Qualifier: Diagnosis of  By: Denton Meek MD, Tillie Rung    . Migraine    "none anymore" (02/21/2017)  . Myocardial infarction [facility]) ?2005  . Obesity   . OSA (obstructive sleep apnea)    previously used CPAP, has lost it (02/21/2017)  . Seizures (HCC)    As a teenager     Patient Active Problem List   Diagnosis Date Noted  . Right foot pain 07/09/2019  . Anxiety 10/21/2018  . Shellfish allergy 03/19/2018  . Unilateral primary osteoarthritis, left knee 04/29/2017  . Unilateral primary osteoarthritis, right knee 04/29/2017  . New-onset angina (HCC) 02/21/2017  . LVH (left ventricular hypertrophy) 08/15/2016  . Status post total replacement of right hip 11/23/2014  . Osteoarthritis of right hip 10/13/2014  . Nocturnal leg cramps 01/06/2014  . Bilateral lower extremity edema 06/02/2013  . DVT of lower extremity, bilateral (HCC) 04/04/2012  . Routine health maintenance 01/14/2012  . GERD (gastroesophageal reflux disease) 01/09/2012  . Urinary incontinence 12/31/2011  . Chronic kidney disease (CKD), stage III (moderate) (HCC) 09/19/2010  . Chronic pelvic pain in female 02/03/2010  . Asthma, chronic 09/27/2009  .  Elevated alkaline phosphatase level 08/01/2009  . Pain in joint, multiple sites 12/21/2008  . MAJOR DPRSV DISORDER RECURRENT EPISODE MODERATE 09/08/2008  . Gout 07/05/2008  . Hyperlipidemia 05/07/2006  . Coronary atherosclerosis 02/05/2006  . Morbid obesity (HCC) 12/13/2005  . History of alcohol abuse 12/13/2005  . SLEEP APNEA, OBSTRUCTIVE 12/13/2005  . Essential hypertension 12/13/2005    Past Surgical History:  Procedure Laterality Date  . ABDOMINAL HYSTERECTOMY     partial; due to endometriosis  . CARDIAC CATHETERIZATION  06/2001, 02/2002, 10/2004    (10-15% proximal stenosis of  circumflex, diffuse disease of  OM1 and RCA)  . CARDIAC CATHETERIZATION  02/21/2017  . COLONOSCOPY    . JOINT REPLACEMENT    . KNEE ARTHROSCOPY Left   . LEFT HEART CATH AND CORONARY ANGIOGRAPHY N/A 02/21/2017   Procedure: LEFT HEART CATH AND CORONARY ANGIOGRAPHY;  Surgeon: Rinaldo Cloud, MD;  Location: [facility] INVASIVE CV LAB;  Service: Cardiovascular;  Laterality: N/A;  . TOTAL HIP ARTHROPLASTY Right 11/23/2014   Procedure: RIGHT TOTAL HIP ARTHROPLASTY ANTERIOR APPROACH;  Surgeon: Kathryne Hitch, MD;  Location: [facility] OR;  Service: Orthopedics;  Laterality: Right;  . TUBAL LIGATION       OB History   No obstetric history on file.     Family History  Problem Relation Age of Onset  . Mental illness Mother   . Heart disease Father   . Diabetes Sister   . Diabetes Sister   . Diabetes Sister     Social History   Tobacco Use  . Smoking status: Never Smoker  . Smokeless tobacco: Never Used  Vaping Use  . Vaping Use: Never used  Substance Use Topics  . Alcohol use: Yes    Alcohol/week: 12.0 standard drinks    Types: 6 Cans of beer, 6 Shots of liquor per week    Comment: Sometimes.  . Drug use: No    Home Medications Prior to Admission medications   Medication Sig Start Date End Date Taking? Authorizing Provider  acetaminophen (TYLENOL) 500 MG tablet Take 1,000 mg by mouth every 6 (six) hours as needed for headache (pain).    Yes [provider]  amLODipine (NORVASC) 5 MG tablet Take 5 mg by mouth daily. 08/05/19  Yes [provider]  atorvastatin (LIPITOR) 40 MG tablet TAKE ONE TABLET BY MOUTH DAILY Patient taking differently: Take 40 mg by mouth daily.  06/30/19  Yes Inez Catalina, MD  baclofen (LIORESAL) 10 MG tablet Take 1 tablet (10 mg total) by mouth daily as needed for muscle spasms. 05/15/19 05/14/20 Yes [name] B, MD  EPINEPHrine 0.3 mg/0.3 mL IJ SOAJ injection Inject 0.3 mLs (0.3 mg total) into the muscle as needed for anaphylaxis. Patient taking  differently: Inject 0.3 mg into the muscle once as needed for anaphylaxis.  03/19/18  Yes Inez Catalina, MD  FLOVENT HFA 44 MCG/ACT inhaler INHALE TWO PUFFS INTO THE LUNGS TWICE A DAY Patient taking differently: Inhale 2 puffs into the lungs 2 (two) times daily.  08/07/19  Yes Inez Catalina, MD  fluticasone (FLONASE) 50 MCG/ACT nasal spray SHAKE LIQUID AND USE 2 SPRAYS IN EACH NOSTRIL DAILY AS NEEDED FOR ALLERGIES Patient taking differently: Place 2 sprays into both nostrils daily as needed for allergies.  07/16/19  Yes Inez Catalina, MD  furosemide (LASIX) 40 MG tablet TAKE ONE TABLET BY MOUTH DAILY Patient taking differently: Take 40 mg by mouth daily.  06/30/19  Yes Inez Catalina, MD  hydrALAZINE (APRESOLINE)  50 MG tablet Take 50 mg by mouth 2 (two) times daily. 07/09/19  Yes [provider]  losartan (COZAAR) 25 MG tablet Take 1 tablet (25 mg total) by mouth daily. 10/21/18 10/21/19 Yes Dellia Cloud, MD  metoprolol succinate (TOPROL-XL) 50 MG 24 hr tablet Take 50 mg by mouth daily. 01/21/17  Yes [provider]  nitroGLYCERIN (NITROSTAT) 0.4 MG SL tablet PLACE ONE TABLET UNDER THE TONGUE EVERY FIVE MINUTES AS NEEDED FOR CHEST PAIN Patient taking differently: Place 0.4 mg under the tongue every 5 (five) minutes as needed for chest pain.  08/19/18  Yes Inez Catalina, MD  omeprazole (PRILOSEC) 20 MG capsule Take 1 capsule (20 mg total) by mouth daily. 03/11/19  Yes Inez Catalina, MD  oxybutynin (DITROPAN) 5 MG tablet TAKE ONE TABLET BY MOUTH TWICE A DAY Patient taking differently: Take 5 mg by mouth 2 (two) times daily.  06/30/19  Yes Inez Catalina, MD  oxyCODONE-acetaminophen (PERCOCET) 10-325 MG tablet Take 1 tablet by mouth every 8 (eight) hours as needed for pain (chronic arthritis pain, multiple sites, diag code M16.11, M25.50). 07/15/19 08/14/19 Yes Inez Catalina, MD  sertraline (ZOLOFT) 100 MG tablet TAKE ONE TABLET BY MOUTH AT BEDTIME Patient taking differently: Take  100 mg by mouth at bedtime as needed (sleep).  07/22/19  Yes Inez Catalina, MD  sodium bicarbonate 650 MG tablet Take 1 tablet (650 mg total) by mouth daily. 06/02/19 06/01/20 Yes Inez Catalina, MD  XARELTO 20 MG TABS tablet TAKE ONE TABLET BY MOUTH DAILY WITH SUPPER Patient taking differently: Take 20 mg by mouth daily with breakfast. High risk med: Anticoagulant.  Crushed Xarelto can be given down a G-tube but NOT a J-Tube. 10/03/18  Yes Inez Catalina, MD  amLODipine (NORVASC) 10 MG tablet Take 1 tablet (10 mg total) by mouth daily. Patient not taking: Reported on 08/09/2019 08/07/17   Inez Catalina, MD  cephALEXin (KEFLEX) 500 MG capsule Take 1 capsule (500 mg total) by mouth 4 (four) times daily for 7 days. 08/09/19 08/16/19  Gailen Shelter, PA  hydrALAZINE (APRESOLINE) 25 MG tablet TAKE 1 TABLET(25 MG) BY MOUTH TWICE DAILY Patient not taking: Reported on 08/09/2019 05/18/19   Inez Catalina, MD  ondansetron (ZOFRAN ODT) 4 MG disintegrating tablet Take 1 tablet (4 mg total) by mouth every 8 (eight) hours as needed for nausea or vomiting. 08/09/19   Gailen Shelter, PA  zaleplon (SONATA) 5 MG capsule Take 1 capsule (5 mg total) by mouth at bedtime as needed for sleep. Patient not taking: Reported on 08/09/2019 11/21/18   Inez Catalina, MD  furosemide (LASIX) 20 MG tablet Take one half of the tablet once daily for 5 days. Then stop 09/19/10 11/29/19  Denna Haggard, MD    Allergies    Ace inhibitors, Regadenoson, Shrimp [shellfish allergy], Peanut oil, Hydrocodone-acetaminophen, Hydromorphone hcl, Oxycodone-acetaminophen, Propoxyphene n-acetaminophen, and Vicodin [hydrocodone-acetaminophen]  Review of Systems   Review of Systems  Constitutional: Positive for appetite change, chills and fatigue. Negative for fever.  HENT: Negative for congestion.   Eyes: Negative for pain.  Respiratory: Negative for cough and shortness of breath.   Cardiovascular: Negative for chest pain and leg swelling.   Gastrointestinal: Positive for abdominal pain, diarrhea, nausea and vomiting.  Genitourinary: Negative for decreased urine volume, dysuria, frequency, hematuria, urgency, vaginal bleeding and vaginal discharge.  Musculoskeletal: Negative for myalgias.  Skin: Negative for rash.  Neurological: Negative for dizziness and headaches.  Physical Exam Updated Vital Signs BP (!) 160/111 (BP Location: Right Wrist)   Pulse 77   Temp (!) 97.5 F (36.4 C) (Oral)   Resp 18   Ht 5\' 2"  (1.575 m)   Wt 126.1 kg   SpO2 96%   BMI 50.85 kg/m   Physical Exam Vitals and nursing note reviewed.  Constitutional:      General: She is in acute distress.  HENT:     Head: Normocephalic and atraumatic.     Nose: Nose normal.  Eyes:     General: No scleral icterus. Cardiovascular:     Rate and Rhythm: Normal rate and regular rhythm.     Pulses: Normal pulses.     Heart sounds: Normal heart sounds.  Pulmonary:     Effort: Pulmonary effort is normal. No respiratory distress.     Breath sounds: No wheezing.  Abdominal:     Palpations: Abdomen is soft.     Tenderness: There is abdominal tenderness. There is no right CVA tenderness, left CVA tenderness or guarding.     Comments: Obese abdomen.  Right lower quadrant tenderness with palpation with some right sided abdominal tenderness and suprapubic tenderness as well.  No CVA tenderness bilaterally.  No guarding or rebound.  Musculoskeletal:     Cervical back: Normal range of motion.     Right lower leg: No edema.     Left lower leg: No edema.  Skin:    General: Skin is warm and dry.     Capillary Refill: Capillary refill takes less than 2 seconds.  Neurological:     Mental Status: She is alert. Mental status is at baseline.  Psychiatric:        Mood and Affect: Mood normal.        Behavior: Behavior normal.     ED Results / Procedures / Treatments   Labs (all labs ordered are listed, but only abnormal results are displayed) Labs Reviewed   COMPREHENSIVE METABOLIC PANEL - Abnormal; Notable for the following components:      Result Value   Potassium 3.4 (*)    Glucose, Bld 116 (*)    Creatinine, Ser 1.37 (*)    Calcium 8.5 (*)    GFR calc non Af Amer 40 (*)    GFR calc Af Amer 46 (*)    All other components within normal limits  CBC - Abnormal; Notable for the following components:   WBC 3.7 (*)    All other components within normal limits  URINALYSIS, ROUTINE W REFLEX MICROSCOPIC - Abnormal; Notable for the following components:   Color, Urine AMBER (*)    APPearance CLOUDY (*)    Hgb urine dipstick SMALL (*)    Protein, ur >=300 (*)    Nitrite POSITIVE (*)    Leukocytes,Ua MODERATE (*)    WBC, UA >50 (*)    Bacteria, UA MANY (*)    All other components within normal limits  URINE CULTURE  POC OCCULT BLOOD, ED  TYPE AND SCREEN  ABO/RH    EKG EKG Interpretation  Date/Time:  Sunday August 09 2019 18:28:57 EDT Ventricular Rate:  79 PR Interval:  158 QRS Duration: 66 QT Interval:  388 QTC Calculation: 444 R Axis:   32 Text Interpretation: Normal sinus rhythm Normal ECG No significant change since last tracing Confirmed by [id number] [phone]) on 08/09/2019 11:15:33 PM   Radiology CT ABDOMEN PELVIS W CONTRAST  Result Date: 08/09/2019 CLINICAL DATA:  Right lower quadrant abdominal pain. Dark  stools for 2 days. EXAM: CT ABDOMEN AND PELVIS WITH CONTRAST TECHNIQUE: Multidetector CT imaging of the abdomen and pelvis was performed using the standard protocol following bolus administration of intravenous contrast. CONTRAST:  133mL OMNIPAQUE IOHEXOL 300 MG/ML  SOLN COMPARISON:  Abdominal CT 12/07/2015 FINDINGS: Lower chest: The lung bases are clear. Hepatobiliary: No focal liver abnormality is seen. No gallstones, gallbladder wall thickening, or biliary dilatation. Pancreas: No ductal dilatation or inflammation. Spleen: Normal in size without focal abnormality. Adrenals/Urinary Tract: Normal adrenal glands. No  hydronephrosis or perinephric edema. Homogeneous renal enhancement with symmetric excretion on delayed phase imaging. Urinary bladder is partially distended without wall thickening. Stomach/Bowel: Patulous distal esophagus. Decompressed stomach. Normal positioning of the duodenum and ligament of Treitz. There is no small bowel obstruction or inflammatory change. The terminal ileum appears normal. The appendix is normal coursing into the right upper quadrant. Possible chain sutures at the base of the cecum. Scattered colonic diverticulosis without diverticulitis. No colonic wall thickening or inflammatory change. Vascular/Lymphatic: Mild aorto bi-iliac atherosclerosis. No aortic aneurysm. Patent portal vein. No bulky abdominopelvic adenopathy. Reproductive: Post hysterectomy. The right ovary is visualized and normal. Left ovary is tentatively seen and normal. No suspicious adnexal mass. Other: No free air, free fluid, or intra-abdominal fluid collection. Musculoskeletal: Right hip arthroplasty. Degenerative disc disease at L4-L5. Multilevel facet hypertrophy. There are no acute or suspicious osseous abnormalities. IMPRESSION: 1. No acute abnormality or explanation for abdominal pain. Normal appendix. 2. Colonic diverticulosis without diverticulitis. Aortic Atherosclerosis (ICD10-I70.0). Electronically Signed   By: Keith Rake M.D.   On: 08/09/2019 23:21    Procedures Procedures (including critical care time)  Medications Ordered in ED Medications  cefTRIAXone (ROCEPHIN) 1 g in sodium chloride 0.9 % 100 mL IVPB (has no administration in time range)  dicyclomine (BENTYL) capsule 20 mg (20 mg Oral Given 08/09/19 2157)  sodium chloride 0.9 % bolus 1,000 mL (1,000 mLs Intravenous New Bag/Given 08/09/19 2145)  famotidine (PEPCID) IVPB 20 mg premix (0 mg Intravenous Stopped 08/09/19 2244)  morphine 4 MG/ML injection 4 mg (4 mg Intravenous Given 08/09/19 2150)  diphenhydrAMINE (BENADRYL) injection 25 mg (25 mg  Intravenous Given 08/09/19 2147)  metoCLOPramide (REGLAN) injection 10 mg (10 mg Intravenous Given 08/09/19 2149)  iohexol (OMNIPAQUE) 300 MG/ML solution 125 mL (125 mLs Intravenous Contrast Given 08/09/19 2301)    ED Course  I have reviewed the triage vital signs and the nursing notes.  Pertinent labs & imaging results that were available during my care of the patient were reviewed by me and considered in my medical decision making (see chart for details).  Patient is a 69 year old female with past medical history detailed presented today for weakness, fatigue, nausea, vomiting, diarrhea, severe right lower quadrant abdominal pain that started Thursday and is progressively worsened.  She states she has been unable to tolerate p.o.  Patient has right lower quadrant abdominal tenderness.  Because of her morbid obesity I am unable to obtain a adequate abdominal exam however with the tenderness in the right lower quadrant I have concern for appendicitis.  Very low suspicion for ovarian torsion she has no pelvic pain low suspicion for ectopic given her lack of vaginal discharge bleeding.  Because patient is endorsing dark stools I did obtain a fecal occult stool test however her stool did not appear particularly dark and was negative fecal occult.  She is on Xarelto therefore is a high risk patient however hemoglobin is normal and I doubt that she has chronic blood loss.  Prior suspicion for appendicitis VS UTI VS gastroenteritis The causes of generalized abdominal pain include but are not limited to AAA, mesenteric ischemia, appendicitis, diverticulitis, DKA, gastritis, gastroenteritis, AMI, nephrolithiasis, pancreatitis, peritonitis, adrenal insufficiency,lead poisoning, iron toxicity, intestinal ischemia, constipation, UTI,SBO/LBO, splenic rupture, biliary disease, IBD, IBS, PUD, or hepatitis. Ectopic pregnancy, ovarian torsion, PID.  I specifically doubt pelvic disease, PID, ovarian torsion,  pregnancy or ectopic pregnancy.  I also doubt AAA, DKA, diverticulitis or kidney stone.  Clinical Course as of Aug 08 2345  Sun Aug 09, 2019  2211 Confirmed by lab this was a negative fecal occult test.  POC occult blood, ED [WF]  2211 CMP with no significant electrolyte abnormalities.  Very mild elevation in creatinine which appears to be unchanged from her baseline.  Comprehensive metabolic panel(!) [WF]  2211 Very mild leukopenia.  They be consistent with a viral infection.  There is no differential ordered with this lab work no indication for further investigation to this.  She is informed of this and will follow up with her primary care doctor about her low WBC.  CBC(!) [WF]  2342 With evidence of acute infection with positive nitrates moderate leukocytes and many bacteria present.  Culture obtained.  Patient given 1 g Rocephin IV and discharged with Keflex.  Urinalysis, Routine w reflex microscopic(!) [WF]    Clinical Course User Index [WF] Gailen ShelterFondaw, Mell Mellott S, [place]   I discussed this case with my attending physician who cosigned this note including patient's presenting symptoms, physical exam, and planned diagnostics and interventions. Attending physician stated agreement with plan or made changes to plan which were implemented.   Attending physician assessed patient at bedside.  Patient discharged with Keflex and Zofran for nausea medication.  She is tolerating p.o. and ambulatory and states her pain is well controlled this time.  MDM Rules/Calculators/A&P                          Final Clinical Impression(s) / ED Diagnoses Final diagnoses:  Acute cystitis with hematuria  Fatigue, unspecified type    Rx / DC Orders ED Discharge Orders         Ordered    ondansetron (ZOFRAN ODT) 4 MG disintegrating tablet  Every 8 hours PRN     Discontinue  Reprint     08/09/19 2344    cephALEXin (KEFLEX) 500 MG capsule  4 times daily     Discontinue  Reprint     08/09/19 2344            Gailen ShelterFondaw, Aiza Vollrath S, [place] 08/09/19 2347    Nira Connardama, Pedro Eduardo, MD 08/11/19 [phone]

## 2019-08-10 DIAGNOSIS — N3001 Acute cystitis with hematuria: Secondary | ICD-10-CM | POA: Diagnosis not present

## 2019-08-10 LAB — POC OCCULT BLOOD, ED: Fecal Occult Bld: NEGATIVE

## 2019-08-10 NOTE — ED Provider Notes (Signed)
Attestation: Medical screening examination/treatment/procedure(s) were conducted as a shared visit with non-physician practitioner(s) and myself.  I personally evaluated the patient during the encounter.   Briefly, the patient is a 69 y.o. female on DOAC anticoagulation with a history of anemia, arthritis, CAD, CHF, CKD, chronic back pain, depression, DJD, DVT, reflux, hyperlipidemia, HTN, morbid obesity, migraines  Patient is presented today with complaints of generalized fatigue.  Vitals:   08/09/19 2322 08/10/19 0028  BP: (!) 160/111 (!) 182/121  Pulse: 77 78  Resp: 18 18  Temp:    SpO2: 96% 94%    CONSTITUTIONAL:  nontoxic-appearing, NAD NEURO:  Alert and oriented x 3, no focal deficits EYES:  pupils equal and reactive ENT/NECK:  trachea midline, no JVD CARDIO:  reg rate, reg rhythm, well-perfused PULM:  None labored breathing GI/GU:  Abdomin non-distended MSK/SPINE:  No gross deformities, no edema SKIN:  no rash, atraumatic PSYCH:  Appropriate speech and behavior   EKG Interpretation  Date/Time:  Sunday August 09 2019 18:28:57 EDT Ventricular Rate:  79 PR Interval:  158 QRS Duration: 66 QT Interval:  388 QTC Calculation: 444 R Axis:   32 Text Interpretation: Normal sinus rhythm Normal ECG No significant change since last tracing Confirmed by Drema Pry [phone]) on 08/09/2019 11:15:33 PM       Work up consistent with UTI. CT negative. She is not septic. IV rocephin given. DC with keflex as she is tolerating PO.  The patient appears reasonably screened and/or stabilized for discharge and I doubt any other medical condition or other [facility] requiring further screening, evaluation, or treatment in the ED at this time prior to discharge. Safe for discharge with strict return precautions. Nira Conn, MD 08/11/19 [phone]

## 2019-08-11 DIAGNOSIS — I82402 Acute embolism and thrombosis of unspecified deep veins of left lower extremity: Secondary | ICD-10-CM | POA: Diagnosis not present

## 2019-08-11 DIAGNOSIS — I10 Essential (primary) hypertension: Secondary | ICD-10-CM | POA: Diagnosis not present

## 2019-08-11 DIAGNOSIS — N189 Chronic kidney disease, unspecified: Secondary | ICD-10-CM | POA: Diagnosis not present

## 2019-08-11 DIAGNOSIS — E785 Hyperlipidemia, unspecified: Secondary | ICD-10-CM | POA: Diagnosis not present

## 2019-08-11 DIAGNOSIS — I25118 Atherosclerotic heart disease of native coronary artery with other forms of angina pectoris: Secondary | ICD-10-CM | POA: Diagnosis not present

## 2019-08-12 ENCOUNTER — Other Ambulatory Visit: Payer: Self-pay

## 2019-08-12 ENCOUNTER — Ambulatory Visit (INDEPENDENT_AMBULATORY_CARE_PROVIDER_SITE_OTHER): Payer: Medicare Other | Admitting: Internal Medicine

## 2019-08-12 ENCOUNTER — Encounter: Payer: Self-pay | Admitting: Internal Medicine

## 2019-08-12 VITALS — BP 115/69 | HR 68 | Temp 98.6°F | Ht 62.0 in | Wt 276.8 lb

## 2019-08-12 DIAGNOSIS — M1611 Unilateral primary osteoarthritis, right hip: Secondary | ICD-10-CM | POA: Diagnosis not present

## 2019-08-12 DIAGNOSIS — I209 Angina pectoris, unspecified: Secondary | ICD-10-CM

## 2019-08-12 DIAGNOSIS — M62838 Other muscle spasm: Secondary | ICD-10-CM | POA: Diagnosis not present

## 2019-08-12 DIAGNOSIS — G4762 Sleep related leg cramps: Secondary | ICD-10-CM | POA: Diagnosis not present

## 2019-08-12 DIAGNOSIS — Z0000 Encounter for general adult medical examination without abnormal findings: Secondary | ICD-10-CM

## 2019-08-12 DIAGNOSIS — Z8639 Personal history of other endocrine, nutritional and metabolic disease: Secondary | ICD-10-CM

## 2019-08-12 DIAGNOSIS — N1831 Chronic kidney disease, stage 3a: Secondary | ICD-10-CM

## 2019-08-12 DIAGNOSIS — I10 Essential (primary) hypertension: Secondary | ICD-10-CM

## 2019-08-12 DIAGNOSIS — J4520 Mild intermittent asthma, uncomplicated: Secondary | ICD-10-CM

## 2019-08-12 DIAGNOSIS — Z79899 Other long term (current) drug therapy: Secondary | ICD-10-CM | POA: Diagnosis not present

## 2019-08-12 DIAGNOSIS — M2550 Pain in unspecified joint: Secondary | ICD-10-CM

## 2019-08-12 DIAGNOSIS — I82403 Acute embolism and thrombosis of unspecified deep veins of lower extremity, bilateral: Secondary | ICD-10-CM

## 2019-08-12 DIAGNOSIS — F331 Major depressive disorder, recurrent, moderate: Secondary | ICD-10-CM

## 2019-08-12 LAB — URINE CULTURE: Culture: >=100000 — AB

## 2019-08-12 MED ORDER — CYCLOBENZAPRINE HCL 5 MG PO TABS
5.0000 mg | ORAL_TABLET | Freq: Three times a day (TID) | ORAL | 0 refills | Status: DC | PRN
Start: 2019-08-12 — End: 2019-11-05

## 2019-08-12 NOTE — Progress Notes (Signed)
   Subjective:    Patient ID: Vanessa Palmer, female    DOB: 01-03-1951, 69 y.o.   MRN: [id number]  CC: Follow up for HTN  HPI  Ms. Elwell is a 69 year old woman with PMH of CAD, HLD, gout, MDD< HTN, chronic pain, CKD, h/o DVT on xarelto, GERD, UI, recent UTI who presents for follow up.    She notes worsening stomach and leg cramping.  Baclofen not helping.  PHQ-9 is high today, she reports this is due to her UTI which is improving with antibiotics.  She does not think she needs further treatment for mood at this time.     Review of Systems  Constitutional: Negative for activity change, appetite change, fatigue and fever.  Respiratory: Positive for shortness of breath (with exercise.  She is mostly sedentary). Negative for cough and wheezing.   Cardiovascular: Negative for chest pain and leg swelling.  Genitourinary: Negative for difficulty urinating and dysuria.       UTI symptoms have improved  Musculoskeletal:       Muscle cramping in legs and stomach  Skin: Negative for color change.  Psychiatric/Behavioral: Positive for decreased concentration and dysphoric mood.       Objective:   Physical Exam Vitals and nursing note reviewed.  Constitutional:      General: She is not in acute distress.    Appearance: Normal appearance. She is not toxic-appearing.  HENT:     Head: Normocephalic and atraumatic.  Pulmonary:     Effort: Pulmonary effort is normal. No respiratory distress.  Musculoskeletal:        General: No swelling or tenderness.  Skin:    General: Skin is warm and dry.  Neurological:     General: No focal deficit present.     Mental Status: She is alert. Mental status is at baseline.     Comments: In wheelchair  Psychiatric:        Behavior: Behavior normal.        Thought Content: Thought content normal.     UDS, BMET, B12, Vitamin D today      Assessment & Plan:  RTC in 3 months.

## 2019-08-12 NOTE — Patient Instructions (Addendum)
Ms. Baillargeon - -  For your muscle spasms please STOP baclofen and try Cyclobenzaprine, more information below.    Please STOP zaleplon.    Come back to see me in 3 months.   Cyclobenzaprine tablets What is this medicine? CYCLOBENZAPRINE (sye kloe BEN za preen) is a muscle relaxer. It is used to treat muscle pain, spasms, and stiffness. This medicine may be used for other purposes; ask your health care provider or pharmacist if you have questions. COMMON BRAND NAME(S): Fexmid, Flexeril What should I tell my health care provider before I take this medicine? They need to know if you have any of these conditions:  heart disease, irregular heartbeat, or previous heart attack  liver disease  thyroid problem  an unusual or allergic reaction to cyclobenzaprine, tricyclic antidepressants, lactose, other medicines, foods, dyes, or preservatives  pregnant or trying to get pregnant  breast-feeding How should I use this medicine? Take this medicine by mouth with a glass of water. Follow the directions on the prescription label. If this medicine upsets your stomach, take it with food or milk. Take your medicine at regular intervals. Do not take it more often than directed. Talk to your pediatrician regarding the use of this medicine in children. Special care may be needed. Overdosage: If you think you have taken too much of this medicine contact a poison control center or emergency room at once. NOTE: This medicine is only for you. Do not share this medicine with others. What if I miss a dose? If you miss a dose, take it as soon as you can. If it is almost time for your next dose, take only that dose. Do not take double or extra doses. What may interact with this medicine? Do not take this medicine with any of the following medications:  MAOIs like Carbex, Eldepryl, Marplan, Nardil, and Parnate  narcotic medicines for cough  safinamide This medicine may also interact with the following  medications:  alcohol  bupropion  antihistamines for allergy, cough and cold  certain medicines for anxiety or sleep  certain medicines for bladder problems like oxybutynin, tolterodine  certain medicines for depression like amitriptyline, fluoxetine, sertraline  certain medicines for Parkinson's disease like benztropine, trihexyphenidyl  certain medicines for seizures like phenobarbital, primidone  certain medicines for stomach problems like dicyclomine, hyoscyamine  certain medicines for travel sickness like scopolamine  general anesthetics like halothane, isoflurane, methoxyflurane, propofol  ipratropium  local anesthetics like lidocaine, pramoxine, tetracaine  medicines that relax muscles for surgery  narcotic medicines for pain  phenothiazines like chlorpromazine, mesoridazine, prochlorperazine, thioridazine  verapamil This list may not describe all possible interactions. Give your health care provider a list of all the medicines, herbs, non-prescription drugs, or dietary supplements you use. Also tell them if you smoke, drink alcohol, or use illegal drugs. Some items may interact with your medicine. What should I watch for while using this medicine? Tell your doctor or health care professional if your symptoms do not start to get better or if they get worse. You may get drowsy or dizzy. Do not drive, use machinery, or do anything that needs mental alertness until you know how this medicine affects you. Do not stand or sit up quickly, especially if you are an older patient. This reduces the risk of dizzy or fainting spells. Alcohol may interfere with the effect of this medicine. Avoid alcoholic drinks. If you are taking another medicine that also causes drowsiness, you may have more side effects. Give your health care  provider a list of all medicines you use. Your doctor will tell you how much medicine to take. Do not take more medicine than directed. Call emergency for  help if you have problems breathing or unusual sleepiness. Your mouth may get dry. Chewing sugarless gum or sucking hard candy, and drinking plenty of water may help. Contact your doctor if the problem does not go away or is severe. What side effects may I notice from receiving this medicine? Side effects that you should report to your doctor or health care professional as soon as possible:  allergic reactions like skin rash, itching or hives, swelling of the face, lips, or tongue  breathing problems  chest pain  fast, irregular heartbeat  hallucinations  seizures  unusually weak or tired Side effects that usually do not require medical attention (report to your doctor or health care professional if they continue or are bothersome):  headache  nausea, vomiting This list may not describe all possible side effects. Call your doctor for medical advice about side effects. You may report side effects to FDA at 1-800-FDA-1088. Where should I keep my medicine? Keep out of the reach of children. Store at room temperature between 15 and 30 degrees C (59 and 86 degrees F). Keep container tightly closed. Throw away any unused medicine after the expiration date. NOTE: This sheet is a summary. It may not cover all possible information. If you have questions about this medicine, talk to your doctor, pharmacist, or health care provider.  2020 Elsevier/Gold Standard (2018-01-15 12:49:26)

## 2019-08-13 ENCOUNTER — Telehealth: Payer: Self-pay | Admitting: *Deleted

## 2019-08-13 LAB — BMP8+ANION GAP
Anion Gap: 18 mmol/L (ref 10.0–18.0)
BUN/Creatinine Ratio: 12 (ref 12–28)
BUN: 18 mg/dL (ref 8–27)
CO2: 20 mmol/L (ref 20–29)
Calcium: 8.1 mg/dL — ABNORMAL LOW (ref 8.7–10.3)
Chloride: 102 mmol/L (ref 96–106)
Creatinine, Ser: 1.52 mg/dL — ABNORMAL HIGH (ref 0.57–1.00)
GFR calc Af Amer: 41 mL/min/{1.73_m2} — ABNORMAL LOW (ref >59)
GFR calc non Af Amer: 35 mL/min/{1.73_m2} — ABNORMAL LOW (ref >59)
Glucose: 101 mg/dL — ABNORMAL HIGH (ref 65–99)
Potassium: 3.3 mmol/L — ABNORMAL LOW (ref 3.5–5.2)
Sodium: 140 mmol/L (ref 134–144)

## 2019-08-13 LAB — VITAMIN D 25 HYDROXY (VIT D DEFICIENCY, FRACTURES): Vit D, 25-Hydroxy: 15.1 ng/mL — ABNORMAL LOW (ref 30.0–100.0)

## 2019-08-13 LAB — VITAMIN B12: Vitamin B-12: 390 pg/mL (ref 232–1245)

## 2019-08-13 NOTE — Telephone Encounter (Signed)
Post ED Visit - Positive Culture Follow-up  Culture report reviewed by antimicrobial stewardship pharmacist: [facility] []  , Pharm.D. []  Enzo Bi, Pharm.D., BCPS AQ-ID []  , Pharm.D., BCPS []  Celedonio Miyamoto, Pharm.D., BCPS []  [place], Garvin Fila.D., BCPS, AAHIVP []  , Pharm.D., BCPS, AAHIVP []  Georgina Pillion, PharmD, BCPS []  , PharmD, BCPS []  [place], PharmD, BCPS []  [place], PharmD []  , PharmD, BCPS []  Estella Husk, PharmD  Pharmacy Team []  Lysle Pearl, PharmD []  , PharmD []  Phillips Climes, PharmD [x]  , Rph []  Agapito Games) , PharmD []  Verlan Friends, PharmD []  , PharmD []  Mervyn Gay, PharmD []  , PharmD []  Vinnie Level, PharmD []  W[facility], PharmD []  , PharmD []  Len Childs, PharmD   Positive urine culture, reviewed by , PA-C Treated with Cephalexin, organism sensitive to the same and no further patient follow-up is required at this time.  Greer Pickerel [facility] 08/13/2019, 12:40 PM

## 2019-08-17 ENCOUNTER — Other Ambulatory Visit: Payer: Self-pay | Admitting: Internal Medicine

## 2019-08-17 DIAGNOSIS — M2550 Pain in unspecified joint: Secondary | ICD-10-CM

## 2019-08-17 NOTE — Telephone Encounter (Signed)
Need refill on pain medicine; pt contact [phone]

## 2019-08-17 NOTE — Telephone Encounter (Signed)
Last rx written 07/15/19. Last OV 08/12/19. Next OV  Has not been scheduled. UDS 08/12/19.

## 2019-08-18 MED ORDER — OXYCODONE-ACETAMINOPHEN 10-325 MG PO TABS: 1.0000 | ORAL_TABLET | Freq: Three times a day (TID) | ORAL | 0 refills | Status: DC | PRN

## 2019-08-18 NOTE — Assessment & Plan Note (Signed)
No issues today, no change in urination.    Will plan to check BMET today.

## 2019-08-18 NOTE — Assessment & Plan Note (Signed)
  BP today is well controlled at 115/69.  She is taking metoprolol 50mg  daily, amlodipine 5mg  daily, lasix 40mg  daily, hydralazine 50mg  BID and losartan 25mg  daily.  She is not having any dizziness, headache or chest pain.   Plan for BMET today.

## 2019-08-18 NOTE — Assessment & Plan Note (Signed)
She has no chest pain today and no concerns about her pain.  No use of NTG

## 2019-08-18 NOTE — Assessment & Plan Note (Signed)
PHQ-9 is worse today, but she notes that this is due to recent UTI.  She does not feel that this is a chronic issue or that she needs a change to medications.   Continue sertraline.

## 2019-08-18 NOTE — Assessment & Plan Note (Signed)
She notes baclofen is not working very well.  She is having trouble sleeping due to the cramping.  She also notes that she is not taking the Sonata as it does not work very well either.   Plan STOP baclofen and sonata.   Start cyclobenzaprine at night.

## 2019-08-18 NOTE — Assessment & Plan Note (Signed)
She has no complaints today.  Continues on flovent without issue.

## 2019-08-18 NOTE — Assessment & Plan Note (Signed)
She is on chronic therapy with percocet.  She notes that this continues to help with her joint pain.  She has had no issues with getting refills.  Last tox screen some time ago.   Plan Check UDS today 3 month refills of percocet.   She is on a stable daily dose.   PDMP reviewed today and appropriate

## 2019-08-18 NOTE — Assessment & Plan Note (Signed)
She is doing well, continues on xarelto.  No bleeding

## 2019-08-18 NOTE — Assessment & Plan Note (Signed)
She had her first COVID vaccine this month.  She had a PAP in 2019 which was negative.

## 2019-08-19 LAB — TOXASSURE SELECT,+ANTIDEPR,UR

## 2019-08-27 ENCOUNTER — Telehealth: Payer: Self-pay | Admitting: Internal Medicine

## 2019-08-27 ENCOUNTER — Other Ambulatory Visit: Payer: Self-pay | Admitting: *Deleted

## 2019-08-27 NOTE — Patient Outreach (Signed)
[facility]) Care Management  08/27/2019  ARIKA MAINER Mar 15, 1950 [id number]   RN Health Coach attempted follow up outreach call to patient.  Patient stated she was just waking up and requested call back later today.  Plan: RN will call patient again   Gean Maidens BSN RN [facility] Care Management [phone]

## 2019-08-27 NOTE — Telephone Encounter (Signed)
Pt is calling regarding results; pls contact [phone]

## 2019-08-28 MED ORDER — VITAMIN D (ERGOCALCIFEROL) 1.25 MG (50000 UNIT) PO CAPS: 50000.0000 [IU] | ORAL_CAPSULE | ORAL | 0 refills | Status: DC

## 2019-08-28 MED ORDER — POTASSIUM CHLORIDE ER 10 MEQ PO TBCR: 20.0000 meq | EXTENDED_RELEASE_TABLET | Freq: Two times a day (BID) | ORAL | 0 refills | Status: DC

## 2019-08-28 NOTE — Telephone Encounter (Signed)
I called and spoke with Vanessa Palmer.  Identity confirmed.   She has a low K and a low Vitamin D.    Plan  KCL 20 meq BID for 2 days (8 tabs of 10 meq sent to pharmacy) Ergocalciferol 50,000 IU every 7 days for 8 weeks.   Reviewed with patient.  Answered all questions.   [name], MD

## 2019-08-29 ENCOUNTER — Other Ambulatory Visit: Payer: Self-pay | Admitting: Internal Medicine

## 2019-08-30 ENCOUNTER — Other Ambulatory Visit: Payer: Self-pay | Admitting: Internal Medicine

## 2019-08-31 ENCOUNTER — Other Ambulatory Visit: Payer: Self-pay | Admitting: Internal Medicine

## 2019-09-01 ENCOUNTER — Other Ambulatory Visit: Payer: Self-pay | Admitting: Internal Medicine

## 2019-09-02 ENCOUNTER — Other Ambulatory Visit: Payer: Self-pay | Admitting: Internal Medicine

## 2019-09-14 ENCOUNTER — Other Ambulatory Visit: Payer: Self-pay | Admitting: *Deleted

## 2019-09-14 NOTE — Patient Outreach (Signed)
[facility]) Care Management  09/14/2019  Vanessa Palmer 1950/04/14 [id number]   RN Health Coach attempted follow up outreach call to patient.  Patient was unavailable. HIPPA compliance voicemail message left with return callback number.  Plan: RN will call patient again within 30 days.  Gean Maidens BSN RN [facility] Care Management [phone]

## 2019-09-15 ENCOUNTER — Other Ambulatory Visit: Payer: Self-pay

## 2019-09-15 DIAGNOSIS — M2550 Pain in unspecified joint: Secondary | ICD-10-CM

## 2019-09-15 MED ORDER — OXYCODONE-ACETAMINOPHEN 10-325 MG PO TABS: 1.0000 | ORAL_TABLET | Freq: Three times a day (TID) | ORAL | 0 refills | Status: DC | PRN

## 2019-09-15 NOTE — Telephone Encounter (Signed)
oxyCODONE-acetaminophen (PERCOCET) 10-325 MG table, REFILL REQUEST @  [id number] - [place], [place] - [address] AT [facility] OF [address] Phone:  [phone]  Fax:  [phone]

## 2019-09-15 NOTE — Telephone Encounter (Signed)
Last office visit: 08/12/2019 Last UDS: 08/12/2019 Last written: 08/18/2019 Next appt: none scheduled

## 2019-09-21 ENCOUNTER — Other Ambulatory Visit: Payer: Self-pay | Admitting: Internal Medicine

## 2019-09-21 DIAGNOSIS — I82403 Acute embolism and thrombosis of unspecified deep veins of lower extremity, bilateral: Secondary | ICD-10-CM

## 2019-10-06 DIAGNOSIS — M2550 Pain in unspecified joint: Secondary | ICD-10-CM | POA: Diagnosis not present

## 2019-10-06 DIAGNOSIS — G4733 Obstructive sleep apnea (adult) (pediatric): Secondary | ICD-10-CM | POA: Diagnosis not present

## 2019-10-06 DIAGNOSIS — M1990 Unspecified osteoarthritis, unspecified site: Secondary | ICD-10-CM | POA: Diagnosis not present

## 2019-10-15 ENCOUNTER — Other Ambulatory Visit: Payer: Self-pay

## 2019-10-15 DIAGNOSIS — M2550 Pain in unspecified joint: Secondary | ICD-10-CM

## 2019-10-15 NOTE — Telephone Encounter (Signed)
oxyCODONE-acetaminophen (PERCOCET) 10-325 MG tablet, refill request @  [id number] - [place], [place] - [address] AT [address] OF [address] Phone:  [phone]  Fax:  [phone]      

## 2019-10-16 ENCOUNTER — Other Ambulatory Visit: Payer: Self-pay | Admitting: *Deleted

## 2019-10-16 MED ORDER — OXYCODONE-ACETAMINOPHEN 10-325 MG PO TABS: 1.0000 | ORAL_TABLET | Freq: Three times a day (TID) | ORAL | 0 refills | Status: DC | PRN

## 2019-10-16 NOTE — Patient Outreach (Signed)
[facility]) Care Management  10/16/2019  Vanessa Palmer [birth year] [id number]  RN Health Coach attempted follow up outreach call to patient.  Patient was unavailable. HIPPA compliance voicemail message left with return callback number.  Plan: RN will call patient again within 30 days.  Gean Maidens BSN RN [facility] Care Management [phone]

## 2019-10-16 NOTE — Patient Outreach (Signed)
[facility] ([facility]) Care Management  [facility] Care Manager  10/16/2019   Shalanda D Bonello 09/12/1950 [id number]  RN Health Coach received return  telephone call from patient.  [place]ipaa compliance verified. Per patient she has not been checking her blood pressure due to per patient the blood pressure cuff is  not pumping properly. Patient has had recent fall in July. Patient stated that her knees give out. Patient stated that her right leg is sore. Patient has a caregiver that comes each day to help her get dressed in the mornings. Per patient she does not have a medical alert system.  Patient has agreed for further outreach calls.   Encounter Medications:  Outpatient Encounter Medications as of 10/16/2019  Medication Sig Note  . XARELTO 20 MG TABS tablet TAKE ONE TABLET BY MOUTH DAILY WITH SUPPER   . acetaminophen (TYLENOL) 500 MG tablet Take 1,000 mg by mouth every 6 (six) hours as needed for headache (pain).    . amLODipine (NORVASC) 5 MG tablet Take 5 mg by mouth daily.   . atorvastatin (LIPITOR) 40 MG tablet TAKE ONE TABLET BY MOUTH DAILY (Patient taking differently: Take 40 mg by mouth daily. )   . cyclobenzaprine (FLEXERIL) 5 MG tablet Take 1 tablet (5 mg total) by mouth 3 (three) times daily as needed for muscle spasms.   . EPINEPHrine 0.3 mg/0.3 mL IJ SOAJ injection Inject 0.3 mLs (0.3 mg total) into the muscle as needed for anaphylaxis. (Patient taking differently: Inject 0.3 mg into the muscle once as needed for anaphylaxis. )   . FLOVENT HFA 44 MCG/ACT inhaler INHALE TWO PUFFS INTO THE LUNGS TWICE A DAY (Patient taking differently: Inhale 2 puffs into the lungs 2 (two) times daily. )   . fluticasone (FLONASE) 50 MCG/ACT nasal spray SHAKE LIQUID AND USE 2 SPRAYS IN EACH NOSTRIL DAILY AS NEEDED FOR ALLERGIES (Patient taking differently: Place 2 sprays into both nostrils daily as needed for allergies. )   . furosemide (LASIX) 40 MG tablet TAKE ONE TABLET BY MOUTH DAILY (Patient taking  differently: Take 40 mg by mouth daily. )   . hydrALAZINE (APRESOLINE) 25 MG tablet TAKE 1 TABLET(25 MG) BY MOUTH TWICE DAILY (Patient not taking: Reported on 08/09/2019)   . losartan (COZAAR) 25 MG tablet Take 1 tablet (25 mg total) by mouth daily.   . metoprolol succinate (TOPROL-XL) 50 MG 24 hr tablet Take 50 mg by mouth daily.   . nitroGLYCERIN (NITROSTAT) 0.4 MG SL tablet PLACE ONE TABLET UNDER THE TONGUE EVERY FIVE MINUTES AS NEEDED FOR CHEST PAIN (Patient taking differently: Place 0.4 mg under the tongue every 5 (five) minutes as needed for chest pain. )   . omeprazole (PRILOSEC) 20 MG capsule Take 1 capsule (20 mg total) by mouth daily.   . ondansetron (ZOFRAN ODT) 4 MG disintegrating tablet Take 1 tablet (4 mg total) by mouth every 8 (eight) hours as needed for nausea or vomiting.   . oxybutynin (DITROPAN) 5 MG tablet TAKE ONE TABLET BY MOUTH TWICE A DAY (Patient taking differently: Take 5 mg by mouth 2 (two) times daily. )   . oxyCODONE-acetaminophen (PERCOCET) 10-325 MG tablet Take 1 tablet by mouth every 8 (eight) hours as needed for pain (chronic arthritis pain, multiple sites, diag code M16.11, M25.50).   . potassium chloride (KLOR-CON) 10 MEQ tablet Take 2 tablets (20 mEq total) by mouth 2 (two) times daily.   . sertraline (ZOLOFT) 100 MG tablet TAKE ONE TABLET BY MOUTH AT BEDTIME (Patient   taking differently: Take 100 mg by mouth at bedtime as needed (sleep). )   . sodium bicarbonate 650 MG tablet Take 1 tablet (650 mg total) by mouth daily. 08/09/2019: #100 filled 06/09/2019 [facility] per phone call  . Vitamin D, Ergocalciferol, (DRISDOL) 1.25 MG (50000 UNIT) CAPS capsule Take 1 capsule (50,000 Units total) by mouth every 7 (seven) days.   . [DISCONTINUED] furosemide (LASIX) 20 MG tablet Take one half of the tablet once daily for 5 days. Then stop    No facility-administered encounter medications on file as of 10/16/2019.    Functional Status:  In your present state of health, do you  have any difficulty performing the following activities: 08/12/2019 07/23/2019  Hearing? N -  Comment - -  Vision? N -  Difficulty concentrating or making decisions? N -  Comment - -  Walking or climbing stairs? Y Y  Comment - -  Dressing or bathing? Y -  Comment - -  Doing errands, shopping? Y -  Comment - -  Preparing Food and eating ? - -  Comment - -  Using the Toilet? - -  In the past six months, have you accidently leaked urine? - -  Comment - -  Do you have problems with loss of bowel control? - -  Managing your Medications? - -  Managing your Finances? - -  Housekeeping or managing your Housekeeping? - -  Comment - -  Some recent data might be hidden    Fall/Depression Screening: Fall Risk  10/16/2019 08/12/2019 07/23/2019  Falls in the past year? 1 1 1  Number falls in past yr: 1 1 1  Comment - - -  Injury with Fall? 0 0 0  Risk Factor Category  - - -  Comment - - -  Risk for fall due to : History of fall(s);Impaired balance/gait;Impaired mobility History of fall(s);Impaired balance/gait;Impaired mobility Impaired balance/gait;Impaired mobility  Risk for fall due to: Comment - - -  Follow up Falls evaluation completed Falls prevention discussed -   PHQ 2/9 Scores 08/12/2019 07/23/2019 07/13/2019 07/09/2019 05/15/2019 04/24/2019 11/21/2018  PHQ - 2 Score 5 3 3 4 0 4 3  PHQ- 9 Score 18 15 15 11 0 14 15   Goals Addressed            This Visit's Progress   . Blood Pressure < 140/90       CARE PLAN ENTRY (see longtitudinal plan of care for additional care plan information)  Objective:  . Last practice recorded BP readings:  BP Readings from Last 3 Encounters:  08/12/19 115/69  08/10/19 (!) 178/101  07/23/19 (!) 142/80 .   . Most recent eGFR/CrCl: No results found for: EGFR  No components found for: CRCL  Current Barriers:  . Knowledge Deficits related to basic understanding of hypertension diagnosis . Knowledge deficit related to self care management of  hypertension . Knowledge deficit related to dangers of uncontrolled hypertension . Literacy barriers . Cognitive Deficits  Case Manager Clinical Goal(s):  . Over the next 90 days, patient will verbalize understanding of plan for hypertension management . Over the next 90 days, patient will attend all scheduled medical appointments: PCP and eye exam . Over the next 90 days, patient will demonstrate improved adherence to prescribed treatment plan for hypertension as evidenced by taking all medications as prescribed, monitoring and recording blood pressure as directed, adhering to low sodium/DASH diet . Over the next 90 days, patient will demonstrate improved health management independence as   evidenced by checking blood pressure as directed and notifying PCP if SBP>180 or DBP > 110, taking all medications as prescribe, and adhering to a low sodium diet as discussed. . Over the next 90 days, patient will verbalize basic understanding of hypertension disease process and self health management plan as evidenced by bp<140/90.  Interventions:  . Evaluation of current treatment plan related to hypertension self management and patient's adherence to plan as established by provider. . Discussed plans with patient for ongoing care management follow up and provided patient with direct contact information for care management team . Reviewed scheduled/upcoming provider appointments including:  . Provided education regarding s/s of stroke and stroke prevention . Provided education regarding s/s of heart attack  Patient Self Care Activities:  . Self administers medications as prescribed . Attends all scheduled provider appointments . Monitors BP and records as discussed . Adheres to a low sodium diet/DASH diet . Increase physical activity as tolerated  Initial goal documentation          Assessment:  Patient has not been checking her blood pressure Patient had recent fall Patient does not have a  medical alert system Plan:  RN discussed medical alert system Provided number for [employer] medical alert Provided picture sheet of foods with high and low sodium Sent BP monitor Patient will make appointment with PCP for September Patient will discussed with PCP about rollator walker next visit RN will follow up within the month of September    BSN RN [facility] Care Management [phone]   

## 2019-10-22 ENCOUNTER — Other Ambulatory Visit: Payer: Self-pay | Admitting: Internal Medicine

## 2019-11-01 ENCOUNTER — Other Ambulatory Visit: Payer: Self-pay | Admitting: Internal Medicine

## 2019-11-03 ENCOUNTER — Encounter (HOSPITAL_COMMUNITY): Payer: Self-pay | Admitting: Emergency Medicine

## 2019-11-03 ENCOUNTER — Emergency Department (HOSPITAL_COMMUNITY): Payer: Medicare Other

## 2019-11-03 ENCOUNTER — Emergency Department (HOSPITAL_COMMUNITY)
Admission: EM | Admit: 2019-11-03 | Discharge: 2019-11-03 | Disposition: A | Discharge status: Home / Self Care | Payer: Medicare Other | Attending: Emergency Medicine | Admitting: Emergency Medicine

## 2019-11-03 ENCOUNTER — Telehealth: Payer: Self-pay | Admitting: *Deleted

## 2019-11-03 DIAGNOSIS — R6889 Other general symptoms and signs: Secondary | ICD-10-CM | POA: Diagnosis not present

## 2019-11-03 DIAGNOSIS — Z91010 Allergy to peanuts: Secondary | ICD-10-CM | POA: Diagnosis not present

## 2019-11-03 DIAGNOSIS — Y92410 Unspecified street and highway as the place of occurrence of the external cause: Secondary | ICD-10-CM | POA: Diagnosis not present

## 2019-11-03 DIAGNOSIS — Z79899 Other long term (current) drug therapy: Secondary | ICD-10-CM | POA: Insufficient documentation

## 2019-11-03 DIAGNOSIS — S4991XA Unspecified injury of right shoulder and upper arm, initial encounter: Secondary | ICD-10-CM

## 2019-11-03 DIAGNOSIS — N183 Chronic kidney disease, stage 3 unspecified: Secondary | ICD-10-CM | POA: Diagnosis not present

## 2019-11-03 DIAGNOSIS — Z96641 Presence of right artificial hip joint: Secondary | ICD-10-CM | POA: Diagnosis not present

## 2019-11-03 DIAGNOSIS — S50311A Abrasion of right elbow, initial encounter: Secondary | ICD-10-CM | POA: Diagnosis not present

## 2019-11-03 DIAGNOSIS — Y9389 Activity, other specified: Secondary | ICD-10-CM | POA: Diagnosis not present

## 2019-11-03 DIAGNOSIS — I130 Hypertensive heart and chronic kidney disease with heart failure and stage 1 through stage 4 chronic kidney disease, or unspecified chronic kidney disease: Secondary | ICD-10-CM | POA: Diagnosis not present

## 2019-11-03 DIAGNOSIS — R0902 Hypoxemia: Secondary | ICD-10-CM | POA: Diagnosis not present

## 2019-11-03 DIAGNOSIS — Y999 Unspecified external cause status: Secondary | ICD-10-CM | POA: Insufficient documentation

## 2019-11-03 DIAGNOSIS — S70211A Abrasion, right hip, initial encounter: Secondary | ICD-10-CM | POA: Diagnosis not present

## 2019-11-03 DIAGNOSIS — Z7951 Long term (current) use of inhaled steroids: Secondary | ICD-10-CM | POA: Diagnosis not present

## 2019-11-03 DIAGNOSIS — M79631 Pain in right forearm: Secondary | ICD-10-CM | POA: Diagnosis not present

## 2019-11-03 DIAGNOSIS — S59911A Unspecified injury of right forearm, initial encounter: Secondary | ICD-10-CM | POA: Diagnosis not present

## 2019-11-03 DIAGNOSIS — I509 Heart failure, unspecified: Secondary | ICD-10-CM | POA: Insufficient documentation

## 2019-11-03 DIAGNOSIS — J45909 Unspecified asthma, uncomplicated: Secondary | ICD-10-CM | POA: Diagnosis not present

## 2019-11-03 DIAGNOSIS — Z743 Need for continuous supervision: Secondary | ICD-10-CM | POA: Diagnosis not present

## 2019-11-03 DIAGNOSIS — R0781 Pleurodynia: Secondary | ICD-10-CM | POA: Diagnosis not present

## 2019-11-03 DIAGNOSIS — M47816 Spondylosis without myelopathy or radiculopathy, lumbar region: Secondary | ICD-10-CM | POA: Diagnosis not present

## 2019-11-03 DIAGNOSIS — T148XXA Other injury of unspecified body region, initial encounter: Secondary | ICD-10-CM

## 2019-11-03 DIAGNOSIS — I2510 Atherosclerotic heart disease of native coronary artery without angina pectoris: Secondary | ICD-10-CM | POA: Diagnosis not present

## 2019-11-03 DIAGNOSIS — Z471 Aftercare following joint replacement surgery: Secondary | ICD-10-CM | POA: Diagnosis not present

## 2019-11-03 DIAGNOSIS — M1611 Unilateral primary osteoarthritis, right hip: Secondary | ICD-10-CM | POA: Diagnosis not present

## 2019-11-03 DIAGNOSIS — R52 Pain, unspecified: Secondary | ICD-10-CM | POA: Diagnosis not present
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
## 2019-11-03 NOTE — Telephone Encounter (Signed)
THank you Lauren.
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
## 2019-11-03 NOTE — ED Notes (Signed)
Pt transported to xray 

## 2019-11-03 NOTE — Progress Notes (Signed)
CSW spoke to granddaughter, Ms Earlene Plater, via phone @ [phone] to discuss discharge.  Ms. Earlene Plater states that Pt is able to ambulate for short distances. CSW checked this with Pt and Pt confirmed. Pt also states that she has a walker at home.
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
## 2019-11-04 ENCOUNTER — Other Ambulatory Visit: Payer: Self-pay | Admitting: Internal Medicine
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
## 2019-11-04 NOTE — Telephone Encounter (Signed)
Need refill on muscle relaxers; pt contact [phone]   [id number] - [place], [place] - [address] AT [facility] OF [address] & [address]

## 2019-11-05 MED ORDER — CYCLOBENZAPRINE HCL 5 MG PO TABS: 5.0000 mg | ORAL_TABLET | Freq: Three times a day (TID) | ORAL | 0 refills | Status: DC | PRN

## 2019-11-05 NOTE — Telephone Encounter (Signed)
Pt is calling back; pls contact [phone]
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
## 2019-11-12 ENCOUNTER — Other Ambulatory Visit: Payer: Self-pay

## 2019-11-12 DIAGNOSIS — M2550 Pain in unspecified joint: Secondary | ICD-10-CM

## 2019-11-12 MED ORDER — OXYCODONE-ACETAMINOPHEN 10-325 MG PO TABS: 1.0000 | ORAL_TABLET | Freq: Three times a day (TID) | ORAL | 0 refills | Status: DC | PRN

## 2019-11-12 NOTE — Telephone Encounter (Signed)
oxyCODONE-acetaminophen (PERCOCET) 10-325 MG tablet, refill request @  [id number] - [place], [place] - [address] AT [address] OF [address] Phone:  [phone]  Fax:  [phone]      

## 2019-11-16 ENCOUNTER — Other Ambulatory Visit: Payer: Self-pay | Admitting: *Deleted

## 2019-11-16 NOTE — Patient Outreach (Signed)
[facility]) Care Management  11/16/2019  Vanessa Palmer [birth year] [id number]  RN Health Coach attempted follow up outreach call to patient.  Patient was unavailable.Voice mailbox was full.  Plan: RN will call patient again within 30 days.  Gean Maidens BSN RN [facility] Care Management [phone]

## 2019-11-18 ENCOUNTER — Other Ambulatory Visit: Payer: Self-pay | Admitting: Internal Medicine

## 2019-11-18 DIAGNOSIS — E785 Hyperlipidemia, unspecified: Secondary | ICD-10-CM

## 2019-11-18 DIAGNOSIS — K219 Gastro-esophageal reflux disease without esophagitis: Secondary | ICD-10-CM

## 2019-11-20 NOTE — Telephone Encounter (Signed)
She is supposed to be taking the 50mg  BID.  The other was send in error.  Thanks!

## 2019-12-01 ENCOUNTER — Other Ambulatory Visit: Payer: Self-pay | Admitting: *Deleted

## 2019-12-01 NOTE — Patient Outreach (Addendum)
[facility]) Care Management  12/01/2019  Vanessa Palmer 12-12-1950 [id number]   RN Health Coach attempted follow up outreach call to patient.  Patient was unavailable. HIPPA compliance voicemail message left with return callback number.  Plan: RN sent unsuccessful outreach letter RN will call patient again within 30 days.  Gean Maidens BSN RN [facility] Care Management [phone]

## 2019-12-07 ENCOUNTER — Encounter: Payer: Self-pay | Admitting: *Deleted

## 2019-12-07 NOTE — Progress Notes (Signed)

## 2019-12-11 ENCOUNTER — Other Ambulatory Visit: Payer: Self-pay

## 2019-12-11 ENCOUNTER — Ambulatory Visit (INDEPENDENT_AMBULATORY_CARE_PROVIDER_SITE_OTHER): Payer: Medicare Other | Admitting: Internal Medicine

## 2019-12-11 ENCOUNTER — Encounter: Payer: Self-pay | Admitting: Internal Medicine

## 2019-12-11 VITALS — BP 129/79 | HR 65 | Temp 98.0°F | Ht 62.0 in | Wt 270.0 lb

## 2019-12-11 DIAGNOSIS — I2510 Atherosclerotic heart disease of native coronary artery without angina pectoris: Secondary | ICD-10-CM

## 2019-12-11 DIAGNOSIS — E559 Vitamin D deficiency, unspecified: Secondary | ICD-10-CM

## 2019-12-11 DIAGNOSIS — M2550 Pain in unspecified joint: Secondary | ICD-10-CM

## 2019-12-11 DIAGNOSIS — F331 Major depressive disorder, recurrent, moderate: Secondary | ICD-10-CM

## 2019-12-11 DIAGNOSIS — Z23 Encounter for immunization: Secondary | ICD-10-CM

## 2019-12-11 DIAGNOSIS — Z0001 Encounter for general adult medical examination with abnormal findings: Secondary | ICD-10-CM

## 2019-12-11 DIAGNOSIS — Z0000 Encounter for general adult medical examination without abnormal findings: Secondary | ICD-10-CM

## 2019-12-11 DIAGNOSIS — I10 Essential (primary) hypertension: Secondary | ICD-10-CM

## 2019-12-11 DIAGNOSIS — N183 Chronic kidney disease, stage 3 unspecified: Secondary | ICD-10-CM

## 2019-12-11 DIAGNOSIS — M79671 Pain in right foot: Secondary | ICD-10-CM

## 2019-12-11 DIAGNOSIS — I82503 Chronic embolism and thrombosis of unspecified deep veins of lower extremity, bilateral: Secondary | ICD-10-CM

## 2019-12-11 DIAGNOSIS — Z6841 Body Mass Index (BMI) 40.0 and over, adult: Secondary | ICD-10-CM

## 2019-12-11 DIAGNOSIS — I82403 Acute embolism and thrombosis of unspecified deep veins of lower extremity, bilateral: Secondary | ICD-10-CM

## 2019-12-11 DIAGNOSIS — R600 Localized edema: Secondary | ICD-10-CM

## 2019-12-11 DIAGNOSIS — I129 Hypertensive chronic kidney disease with stage 1 through stage 4 chronic kidney disease, or unspecified chronic kidney disease: Secondary | ICD-10-CM

## 2019-12-11 DIAGNOSIS — R32 Unspecified urinary incontinence: Secondary | ICD-10-CM

## 2019-12-11 MED ORDER — SERTRALINE HCL 25 MG PO TABS: 25.0000 mg | ORAL_TABLET | Freq: Every day | ORAL | 2 refills | Status: DC

## 2019-12-11 MED ORDER — CYCLOBENZAPRINE HCL 5 MG PO TABS: 5.0000 mg | ORAL_TABLET | Freq: Three times a day (TID) | ORAL | 0 refills | Status: DC | PRN

## 2019-12-11 NOTE — Assessment & Plan Note (Signed)
>>  ASSESSMENT AND PLAN FOR CORONARY ATHEROSCLEROSIS WRITTEN ON 12/11/2019 11:28 AM BY Matraca Hunkins B, MD  She denies any chest pain, dizziness, change in ability to perform daily activities, SOB, DOE.  She has good control of her BP.  Continue to monitor.  She has not needed nitroglycerin .

## 2019-12-11 NOTE — Assessment & Plan Note (Signed)
We discussed at length today.  I think she has venous insufficiency given the work up already done.  She has history of DVT bilateral lower extremities which does make her at increased risk.  We discussed compression stockings which she can get over the counter (I do not think she would tolerate medical grade stockings given her pain component) and keeping her legs elevated as much as possible.  She will try these interventions and let me know if they are helping.

## 2019-12-11 NOTE — Assessment & Plan Note (Signed)
BP is well controlled today, she is taking her 4 drug regimen.  She did have some nausea with taking her medications today and this might be due to not eating enough prior to taking (only ate potato salad).  She has no other issues such as dizziness, chest pain, vision changes.   Plan: Continue amlodipine 5mg , hydralazine 50mg  BID, losartan 25mg  and metoprolol ER 50mg  daily.

## 2019-12-11 NOTE — Assessment & Plan Note (Signed)
She utilizes oxybutynin at night which does help.  She also requires protective underwear daily and at night in order to function and complete her ADLs and iADLs.  She has no issue using these products herself.   Plan Continue urinary incontinence supplies and oxybutynin at night.

## 2019-12-11 NOTE — Assessment & Plan Note (Signed)
She denies any chest pain, dizziness, change in ability to perform daily activities, SOB, DOE.  She has good control of her BP.  Continue to monitor.  She has not needed nitroglycerin.

## 2019-12-11 NOTE — Assessment & Plan Note (Signed)
She was supposed to be on sertraline 100mg , however, she is not sure she ever started this medication.  She is going to check at home for it.  She had an elevated PHQ-9 today and could benefit from addition of an SSRI.  Restart sertraline at 25mg  daily today.  Follow up at next visit.

## 2019-12-11 NOTE — Assessment & Plan Note (Signed)
Will get flu shot today.  

## 2019-12-11 NOTE — Assessment & Plan Note (Signed)
She has no change to urinary habits, no confusion or other issues.  She was supposed to be on sodium bicarb and at last check her K was low.  We will check a CMET today and see where she is with these issues.  May need to re-prescribe the NaBicarb.

## 2019-12-11 NOTE — Patient Instructions (Signed)
Vanessa Palmer - -  Thank you for coming in to see me.   Please check your medications at home for the following:   Sertraline - mood medicine for low mood or anxiety  Sodium Bicarbonate - for your kidneys  If you have either of these, please bring them to the clinic to be thrown away (do not flush them or throw them in the trash) you can alternatively take them to your pharmacy to be disposed of.    I will send you in a new prescription for Sertraline 25mg  daily for your mood and anxiety.  Please do not take any other doses of sertraline (Zoloft).    Please schedule a telehealth visit in 2 weeks (resident clinic okay) for a medication review and discussion.   Please schedule an in person appointment with me in 3-4 months.   Thank you!

## 2019-12-11 NOTE — Assessment & Plan Note (Signed)
At last check was 15.  Will recheck today.  She finished 8 weeks of high dose vitamin D.

## 2019-12-11 NOTE — Assessment & Plan Note (Signed)
She is doing well on her xarelto.  No bleeding issues.   Plan Continue xarelto.

## 2019-12-11 NOTE — Progress Notes (Signed)
Subjective:    Patient ID: Vanessa Palmer, female    DOB: 1950/12/07, 69 y.o.   MRN: [id number]  4 month follow up for CKD3, HTN, UI  HPI  [name] is a 69 year old woman with PMH of anxiety, asthma, CKD3, CAD, DVT on xarelto, HTN, GERD, Gout, HLD, chronic pain, UI who presents for follow up.   [name] reports a few acute issues today.  She notes that nausea started this morning.  She notes that she ate (potato salad) for breakfast and then took her medications and then left to get the bus.  Her nausea started while waiting for the bus.  She thinks it may have been due to eating the potato salad.  She still feels a little sick on the stomach, but will let me know if this gets worse or doesn't resolve today.   She further reports swelling in her feet.  She has chronic LE edema which is thought to be due in part to body habitus and also venous insufficiency.   She has previously had a work up with TTE, ABIs and UA for proteinuria.  This was all normal and she was encouraged to wear compression stockings.  We discussed this further today.  She notes that her feet swell more when they are down, better when up.  She has not tried compression stockings yet. We reviewed her work up so far and noted that her history of DVT makes her more at risk for venous insufficiency.   We reviewed her medications.  She does not have several of her medications including nausea medication, sodium bicarbonate and sertraline.  She does not remember every getting the last 2.  She thinks she may have them at home.  Regardless, I advised her not to start the sertraline 100mg  if she has not been taking as that would be too high of a starting dose.  I would like to have her come back for a 2 week telehealth visit to review her medications.    She has some paper work to fill out today including DME supplies and PCS forms.    Review of Systems  Constitutional: Negative for activity change, appetite change and  fatigue.  Respiratory: Negative for cough, shortness of breath and wheezing.   Cardiovascular: Positive for leg swelling (pitting). Negative for chest pain and palpitations.  Gastrointestinal: Positive for nausea. Negative for constipation, diarrhea and vomiting.  Genitourinary: Positive for frequency and urgency. Negative for difficulty urinating and hematuria.  Musculoskeletal: Positive for arthralgias, back pain and gait problem (in a motorized wheelchair).  Skin: Positive for color change (on front of calves bilaterally). Negative for wound.  Neurological: Negative for dizziness and weakness.  Psychiatric/Behavioral: Negative for decreased concentration and dysphoric mood.       Objective:   Physical Exam Vitals and nursing note reviewed.  Constitutional:      General: She is not in acute distress.    Appearance: She is obese. She is not toxic-appearing.  HENT:     Head: Normocephalic and atraumatic.  Eyes:     General: No scleral icterus.       Right eye: No discharge.        Left eye: No discharge.  Cardiovascular:     Rate and Rhythm: Normal rate and regular rhythm.     Heart sounds: No murmur heard.   Pulmonary:     Effort: Pulmonary effort is normal. No respiratory distress.     Breath sounds: Normal  breath sounds. No wheezing.  Abdominal:     General: Bowel sounds are normal. There is no distension.     Palpations: Abdomen is soft.     Tenderness: There is no abdominal tenderness.  Musculoskeletal:        General: Swelling and tenderness present.     Right lower leg: Edema present.     Left lower leg: Edema present.     Comments: Pitting edema of bilateral legs, worse on the right.   Skin:    General: Skin is warm and dry.     Coloration: Skin is pale (pale discoloration in patches on the anterior calves, worse on the right). Skin is not jaundiced.  Neurological:     General: No focal deficit present.     Mental Status: She is alert and oriented to person,  place, and time. Mental status is at baseline.  Psychiatric:        Mood and Affect: Mood normal.        Behavior: Behavior normal.     Vitamin D, CMET, CBC     Assessment & Plan:  Return in 2 weeks for telehealth med review and 3-4 months for routine follow up with me.

## 2019-12-14 ENCOUNTER — Telehealth: Payer: Self-pay | Admitting: Internal Medicine

## 2019-12-14 ENCOUNTER — Telehealth: Payer: Self-pay

## 2019-12-14 LAB — CMP14 + ANION GAP
ALT: 12 IU/L (ref 0–32)
AST: 17 IU/L (ref 0–40)
Albumin/Globulin Ratio: 1.4 (ref 1.2–2.2)
Albumin: 4.3 g/dL (ref 3.8–4.8)
Alkaline Phosphatase: 133 IU/L — ABNORMAL HIGH (ref 44–121)
Anion Gap: 14 mmol/L (ref 10.0–18.0)
BUN/Creatinine Ratio: 15 (ref 12–28)
BUN: 18 mg/dL (ref 8–27)
Bilirubin Total: 0.7 mg/dL (ref 0.0–1.2)
CO2: 22 mmol/L (ref 20–29)
Calcium: 8.7 mg/dL (ref 8.7–10.3)
Chloride: 101 mmol/L (ref 96–106)
Creatinine, Ser: 1.24 mg/dL — ABNORMAL HIGH (ref 0.57–1.00)
GFR calc Af Amer: 52 mL/min/{1.73_m2} — ABNORMAL LOW (ref >59)
GFR calc non Af Amer: 45 mL/min/{1.73_m2} — ABNORMAL LOW (ref >59)
Globulin, Total: 3.1 g/dL (ref 1.5–4.5)
Glucose: 99 mg/dL (ref 65–99)
Potassium: 3.5 mmol/L (ref 3.5–5.2)
Sodium: 137 mmol/L (ref 134–144)
Total Protein: 7.4 g/dL (ref 6.0–8.5)

## 2019-12-14 LAB — CBC
Hematocrit: 32.8 % — ABNORMAL LOW (ref 34.0–46.6)
Hemoglobin: 11.2 g/dL (ref 11.1–15.9)
MCH: 31.5 pg (ref 26.6–33.0)
MCHC: 34.1 g/dL (ref 31.5–35.7)
MCV: 92 fL (ref 79–97)
Platelets: 235 10*3/uL (ref 150–450)
RBC: 3.55 x10E6/uL — ABNORMAL LOW (ref 3.77–5.28)
RDW: 12.7 % (ref 11.7–15.4)
WBC: 5 10*3/uL (ref 3.4–10.8)

## 2019-12-14 LAB — VITAMIN D 25 HYDROXY (VIT D DEFICIENCY, FRACTURES): Vit D, 25-Hydroxy: 35.3 ng/mL (ref 30.0–100.0)

## 2019-12-14 MED ORDER — CYCLOBENZAPRINE HCL 5 MG PO TABS: 5.0000 mg | ORAL_TABLET | Freq: Three times a day (TID) | ORAL | 0 refills | Status: DC | PRN

## 2019-12-14 MED ORDER — SERTRALINE HCL 25 MG PO TABS: 25.0000 mg | ORAL_TABLET | Freq: Every day | ORAL | 2 refills | Status: DC

## 2019-12-14 NOTE — Addendum Note (Signed)
Addended by: [name] B on: 12/14/2019 03:05 PM   Modules accepted: Orders

## 2019-12-14 NOTE — Telephone Encounter (Signed)
Called patient to review blood work which appeared stable.  She had an improved vitamin D.   She reports finding her old sertraline bottle which was empty.   She will start back on sertraline at a initial dose and we will uptitrate to effect.   She expressed understanding.   [name], MD

## 2019-12-14 NOTE — Addendum Note (Signed)
Addended by: Shed Nixon B on: 12/14/2019 03:05 PM   Modules accepted: Orders  

## 2019-12-14 NOTE — Telephone Encounter (Signed)
Pls contact pt regarding medicine [phone]

## 2019-12-14 NOTE — Telephone Encounter (Signed)
RTC, VM obtained and message left to call back. °SChaplin, RN,BSN ° °

## 2019-12-15 ENCOUNTER — Other Ambulatory Visit: Payer: Self-pay | Admitting: Internal Medicine

## 2019-12-15 ENCOUNTER — Other Ambulatory Visit: Payer: Self-pay

## 2019-12-15 DIAGNOSIS — M2550 Pain in unspecified joint: Secondary | ICD-10-CM

## 2019-12-15 NOTE — Telephone Encounter (Signed)
Need refill on oxyCODONE-acetaminophen (PERCOCET) 10-325 MG tablet(Expired ;pt contact [phone]   [id number] - [place], [place] - [address] AT [address] OF [address] 

## 2019-12-16 MED ORDER — OXYCODONE-ACETAMINOPHEN 10-325 MG PO TABS: 1.0000 | ORAL_TABLET | Freq: Three times a day (TID) | ORAL | 0 refills | Status: DC | PRN

## 2019-12-16 NOTE — Telephone Encounter (Signed)
Need refill on kidney medicine; pt contact [phone]    [id number] - [place], [place] - [address] AT [facility] OF [address]

## 2019-12-21 NOTE — Progress Notes (Signed)
Things That May Be Affecting Your Health:  Alcohol  Hearing loss  Pain    Depression  Home Safety  Sexual Health   Diabetes X Lack of physical activity  Stress   Difficulty with daily activities  Loneliness  Tiredness   Drug use  Medicines  Tobacco use  X Falls  Motor Vehicle Safety  Weight   Food choices  Oral Health  Other    YOUR PERSONALIZED HEALTH PLAN : 1. Schedule your next subsequent Medicare Wellness visit in one year 2. Attend all of your regular appointments to address your medical issues 3. Complete the preventative screenings and services   Annual Wellness Visit   Medicare Covered Preventative Screenings and Services  Services & Screenings Men and Women Who How Often Need? Date of Last Service Action  Abdominal Aortic Aneurysm Adults with AAA risk factors Once     Alcohol Misuse and Counseling All Adults Screening once a year if no alcohol misuse. Counseling up to 4 face to face sessions.     Bone Density Measurement  Adults at risk for osteoporosis Once every 2 yrs No 2021 Normal  Lipid Panel Z13.6 All adults without CV disease Once every 5 yrs No 2019   Colorectal Cancer   Stool sample or  Colonoscopy All adults 50 and older   Once every year  Every 10 years Yes 2012 Due 2022  Depression All Adults Once a year  Today   Diabetes Screening Blood glucose, post glucose load, or GTT Z13.1  All adults at risk  Pre-diabetics  Once per year  Twice per year No    Diabetes  Self-Management Training All adults Diabetics 10 hrs first year; 2 hours subsequent years. Requires Copay     Glaucoma  Diabetics  Family history of glaucoma  African Americans 50 yrs +  Hispanic Americans 65 yrs + Annually - requires coppay Yes Please order   Hepatitis C Z72.89 or F19.20  High Risk for HCV  Born between 1945 and 1965  Annually  Once No Done 2012 Negative  HIV Z11.4 All adults based on risk  Annually btw ages 19 & 57 regardless of risk  Annually > 65 yrs if at  increased risk No 2013 neg  Lung Cancer Screening Asymptomatic adults aged 32-77 with 30 pack yr history and current smoker OR quit within the last 15 yrs Annually Must have counseling and shared decision making documentation before first screen No    Medical Nutrition Therapy Adults with   Diabetes  Renal disease  Kidney transplant within past 3 yrs 3 hours first year; 2 hours subsequent years     Obesity and Counseling All adults Screening once a year Counseling if BMI 30 or higher Yes Today   Tobacco Use Counseling Adults who use tobacco  Up to 8 visits in one year  No    Vaccines Z23  Hepatitis B  Influenza   Pneumonia  Adults   Once  Once every flu season  Two different vaccines separated by one year Yes  Pneumonia vaccine  Next Annual Wellness Visit People with Medicare Every year  Today     Services & Screenings Women Who How Often Need  Date of Last Service Action  Mammogram  Z12.31 Women over 40 One baseline ages 75-39. Annually ager 40 yrs+ yes 04/2018   Pap tests All women Annually if high risk. Every 2 yrs for normal risk women No 2019 Graduated  Screening for cervical cancer with   Pap (Z01.419  nl or Z01.411abnl) &  HPV Z11.51 Women aged 39 to 57 Once every 5 yrs No    Screening pelvic and breast exams All women Annually if high risk. Every 2 yrs for normal risk women If desired    Sexually Transmitted Diseases  Chlamydia  Gonorrhea  Syphilis All at risk adults Annually for non pregnant females at increased risk No        Services & Screenings Men Who How Ofter Need  Date of Last Service Action  Prostate Cancer - DRE & PSA Men over 50 Annually.  DRE might require a copay.     Sexually Transmitted Diseases  Syphilis All at risk adults Annually for men at increased risk

## 2019-12-25 ENCOUNTER — Other Ambulatory Visit: Payer: Self-pay

## 2019-12-25 ENCOUNTER — Ambulatory Visit (INDEPENDENT_AMBULATORY_CARE_PROVIDER_SITE_OTHER): Payer: Medicare Other | Admitting: Student

## 2019-12-25 DIAGNOSIS — I2510 Atherosclerotic heart disease of native coronary artery without angina pectoris: Secondary | ICD-10-CM

## 2019-12-25 DIAGNOSIS — I10 Essential (primary) hypertension: Secondary | ICD-10-CM

## 2019-12-25 DIAGNOSIS — F331 Major depressive disorder, recurrent, moderate: Secondary | ICD-10-CM

## 2019-12-25 NOTE — Assessment & Plan Note (Signed)
>>  ASSESSMENT AND PLAN FOR CORONARY ATHEROSCLEROSIS WRITTEN ON 12/25/2019  2:10 PM BY LEMON RAISIN, MD  Notes chest pain starting yesterday. Pain occurs when she feels more upset does not feel that it is exertional has not tried taking her nitroglycerin . Denies palpation dizziness, SOB, headache. Offered her in person evaluation of chest pain given high rick features of pain at rest to have and EKG to evaluate for cardiac ischemia given history of ischemic heart diesterase. Patient declined as she feels this pain is more related to her grief. Will monitor for continued chest pain.

## 2019-12-25 NOTE — Progress Notes (Signed)
Internal Medicine Clinic Attending  I spoke with the patient on the phone.  I personally confirmed the key portions of the history and exam documented by Dr. Elaina Pattee and I reviewed pertinent patient test results.  The assessment, diagnosis, and plan were formulated together and I agree with the documentation in the resident's note.

## 2019-12-25 NOTE — Assessment & Plan Note (Signed)
Notes chest pain starting yesterday. Pain occurs when she feels more upset does not feel that it is exertional has not tried taking her nitroglycerin. Denies palpation dizziness, SOB, headache. Offered her in person evaluation of chest pain given high rick features of pain at rest to have and EKG to evaluate for cardiac ischemia given history of ischemic heart diesterase. Patient declined as she feels this pain is more related to her grief. Will monitor for continued chest pain.

## 2019-12-25 NOTE — Assessment & Plan Note (Addendum)
Patient was able to pick up sertraline 25 mg last week and has been taking daily. Currently very upset and grieving over death of friend who passed away last night. Patient has been crying and very upset and notes she has had chest pain on and off since last night. She attributes the pain to feeling upset and does not feel this pain is related to her heart.   Plan  Continue sertraline 25 mg daily

## 2019-12-25 NOTE — Progress Notes (Signed)
   CC: Medication review  This is a telephone encounter between Vanessa Palmer and Vanessa Palmer on 12/25/2019 for medication review. The visit was conducted with the patient located at home and Vanessa Palmer at [facility]. The patient's identity was confirmed using their DOB and current address. The patient has consented to being evaluated through a telephone encounter and understands the associated risks (an examination cannot be done and the patient may need to come in for an appointment) / benefits (allows the patient to remain at home, decreasing exposure to coronavirus). I personally spent 13 minutes on medical discussion.   HPI:  [name] is a 69 y.o. with PMH as below.   Please see A&P for assessment of the patient's acute and chronic medical conditions.   Past Medical History:  Diagnosis Date  . ALCOHOL ABUSE 12/13/2005   Annotation: Sober since 11/06 Qualifier: Diagnosis of  By: Wallace Cullens MD, Natalia Leatherwood    . Anemia   . Arthritis    "all over" (02/21/2017)  . CAD (coronary artery disease)    s/p non-Q wave MI in 06/2001 and 2004, 2005  . CHF (congestive heart failure) (HCC)   . Chronic kidney disease (CKD), stage III (moderate) (HCC) 09/19/2010  . Chronic mid back pain   . Depression   . DJD (degenerative joint disease)   . DVT (deep venous thrombosis) (HCC)    BLE  . DVT of lower extremity, bilateral (HCC) 04/04/2012   On Xarelto    . GERD (gastroesophageal reflux disease)   . Gout   . Hyperlipidemia   . Hypertension   . INTRINSIC ASTHMA, WITH EXACERBATION 09/27/2009   Qualifier: Diagnosis of  By: Denton Meek MD, Tillie Rung    . Migraine    "none anymore" (02/21/2017)  . Myocardial infarction [facility]) ?2005  . Obesity   . OSA (obstructive sleep apnea)    previously used CPAP, has lost it (02/21/2017)  . Seizures (HCC)    As a teenager    Review of Systems:  Review of Systems  Constitutional: Negative.   Eyes: Negative for blurred vision.  Respiratory: Negative for shortness of  breath and wheezing.   Cardiovascular: Positive for chest pain. Negative for palpitations and orthopnea.  Gastrointestinal: Positive for nausea.  Genitourinary: Negative.   Musculoskeletal: Negative.   Skin: Negative for itching and rash.  Neurological: Negative for dizziness and headaches.  Endo/Heme/Allergies: Does not bruise/bleed easily.  Psychiatric/Behavioral: Negative for hallucinations and suicidal ideas.       Grieving over death of friend yesterday    Assessment & Plan:   See Encounters Tab for problem based charting.  Patient seen with Dr. Oswaldo Done

## 2019-12-25 NOTE — Assessment & Plan Note (Signed)
Continue on amlodipine, hydralazine, losartan, and metoprolol. No longer having nausea with taking medication has been trying to eat more when taking medications. Does not recall how her BP is doing at home. Denies dizziness, syncope, weakness, vision changes.  Continue on current medications.

## 2020-01-01 ENCOUNTER — Other Ambulatory Visit: Payer: Self-pay | Admitting: *Deleted

## 2020-01-06 NOTE — Patient Instructions (Signed)
Goals Addressed            This Visit's Progress   . (THN)Eat Healthy       Follow Up Date [id number]   - set goal weight - drink 6 to 8 glasses of water each day - manage portion size - set a realistic goal    Why is this important?   When you are ready to manage your nutrition or weight, having a plan and setting goals will help.  Taking small steps to change how you eat and exercise is a good place to start.    Notes:  RN discussed frequent small feedings to help increase her metabolism Discussed low sodium diet    . (THN)Make and Keep All Appointments       Follow Up Date [id number]   - call to cancel if needed - keep a calendar with prescription refill dates - keep a calendar with appointment dates    Why is this important?   Part of staying healthy is seeing the doctor for follow-up care.  If you forget your appointments, there are some things you can do to stay on track.    Notes:     . (THN)Track and Manage My Blood Pressure       Follow Up Date [id number]   - check blood pressure daily - choose a place to take my blood pressure (home, clinic or office, retail store) - write blood pressure results in a log or diary    Why is this important?   You won't feel high blood pressure, but it can still hurt your blood vessels.  High blood pressure can cause heart or kidney problems. It can also cause a stroke.  Making lifestyle changes like losing a little weight or eating less salt will help.  Checking your blood pressure at home and at different times of the day can help to control blood pressure.  If the doctor prescribes medicine remember to take it the way the doctor ordered.  Call the office if you cannot afford the medicine or if there are questions about it.     Notes:

## 2020-01-06 NOTE — Patient Outreach (Signed)
[facility]) Care Management  [facility] Care Manager  [id number] Late entry  Vanessa Palmer 02-09-1951 [id number]   RN Health Coach telephone call to patient.  [employer] compliance verified. Per patient she is checking her blood pressure with the new monitor. Today blood pressure is 132/83 Pulse 71. Patient readings are still showing some elevation.  She has not had any falls within the last 3 months. Per patient she is taking her medications as prescribed. Patient received her flu shot. Patient has caregiver Mrs Vanessa Palmer that comes to assist with bath and chores.  Patient has agreed to follow up outreach calls.   Encounter Medications:  Outpatient Encounter Medications as of 01/01/2020  Medication Sig  . XARELTO 20 MG TABS tablet TAKE ONE TABLET BY MOUTH DAILY WITH SUPPER  . amLODipine (NORVASC) 5 MG tablet Take 5 mg by mouth daily.  Marland Kitchen atorvastatin (LIPITOR) 40 MG tablet Take 1 tablet (40 mg total) by mouth daily.  . cyclobenzaprine (FLEXERIL) 5 MG tablet Take 1 tablet (5 mg total) by mouth 3 (three) times daily as needed for muscle spasms.  Marland Kitchen EPINEPHrine 0.3 mg/0.3 mL IJ SOAJ injection Inject 0.3 mLs (0.3 mg total) into the muscle as needed for anaphylaxis. (Patient taking differently: Inject 0.3 mg into the muscle once as needed for anaphylaxis. )  . FLOVENT HFA 44 MCG/ACT inhaler INHALE TWO PUFFS INTO THE LUNGS TWICE A DAY (Patient taking differently: Inhale 2 puffs into the lungs 2 (two) times daily. )  . fluticasone (FLONASE) 50 MCG/ACT nasal spray SHAKE LIQUID AND USE 2 SPRAYS IN EACH NOSTRIL DAILY AS NEEDED FOR ALLERGIES (Patient taking differently: Place 2 sprays into both nostrils daily as needed for allergies. )  . furosemide (LASIX) 40 MG tablet Take 1 tablet (40 mg total) by mouth daily.  . hydrALAZINE (APRESOLINE) 50 MG tablet TAKE ONE TABLET BY MOUTH TWICE A DAY  . losartan (COZAAR) 25 MG tablet TAKE 1 TABLET(25 MG) BY MOUTH DAILY  . metoprolol succinate (TOPROL-XL) 50 MG  24 hr tablet Take 50 mg by mouth daily.  . nitroGLYCERIN (NITROSTAT) 0.4 MG SL tablet PLACE ONE TABLET UNDER THE TONGUE EVERY FIVE MINUTES AS NEEDED FOR CHEST PAIN (Patient taking differently: Place 0.4 mg under the tongue every 5 (five) minutes as needed for chest pain. )  . omeprazole (PRILOSEC) 20 MG capsule TAKE ONE CAPSULE BY MOUTH DAILY  . oxybutynin (DITROPAN) 5 MG tablet Take 1 tablet (5 mg total) by mouth 2 (two) times daily.  Marland Kitchen oxyCODONE-acetaminophen (PERCOCET) 10-325 MG tablet Take 1 tablet by mouth every 8 (eight) hours as needed for pain (chronic arthritis pain, multiple sites, diag code M16.11, M25.50).  Marland Kitchen sertraline (ZOLOFT) 25 MG tablet Take 1 tablet (25 mg total) by mouth daily.   No facility-administered encounter medications on file as of 01/01/2020.    Functional Status:  In your present state of health, do you have any difficulty performing the following activities: 12/11/2019 08/12/2019  Hearing? N N  Comment - -  Vision? N N  Difficulty concentrating or making decisions? N N  Comment - -  Walking or climbing stairs? Y Y  Comment - -  Dressing or bathing? Y Y  Comment - -  Doing errands, shopping? Y Y  Comment - -  Preparing Food and eating ? - -  Comment - -  Using the Toilet? - -  In the past six months, have you accidently leaked urine? - -  Comment - -  Do you  have problems with loss of bowel control? - -  Managing your Medications? - -  Managing your Finances? - -  Housekeeping or managing your Housekeeping? - -  Comment - -  Some recent data might be hidden    Fall/Depression Screening: Fall Risk  01/01/2020 12/11/2019 10/16/2019  Falls in the past year? 1 1 1   Number falls in past yr: 1 1 1   Comment - - -  Injury with Fall? 0 0 0  Risk Factor Category  - - -  Comment - - -  Risk for fall due to : History of fall(s);Impaired balance/gait;Impaired mobility Impaired balance/gait;Impaired mobility History of fall(s);Impaired balance/gait;Impaired  mobility  Risk for fall due to: Comment - - -  Follow up Falls prevention discussed;Falls evaluation completed Falls prevention discussed Falls evaluation completed   PHQ 2/9 Scores 12/11/2019 08/12/2019 07/23/2019 07/13/2019 07/09/2019 05/15/2019 04/24/2019  PHQ - 2 Score 4 5 3 3 4  0 4  PHQ- 9 Score 13 18 15 15 11  0 14    Assessment:  Goals Addressed            This Visit's Progress   . (THN)Eat Healthy       Follow Up Date 05/17/2019   - set goal weight - drink 6 to 8 glasses of water each day - manage portion size - set a realistic goal    Why is this important?   When you are ready to manage your nutrition or weight, having a plan and setting goals will help.  Taking small steps to change how you eat and exercise is a good place to start.    Notes:  RN discussed frequent small feedings to help increase her metabolism Discussed low sodium diet    . (THN)Make and Keep All Appointments       Follow Up Date 04/26/2019   - call to cancel if needed - keep a calendar with prescription refill dates - keep a calendar with appointment dates    Why is this important?   Part of staying healthy is seeing the doctor for follow-up care.  If you forget your appointments, there are some things you can do to stay on track.    Notes:     . (THN)Track and Manage My Blood Pressure       Follow Up Date   - check blood pressure daily - choose a place to take my blood pressure (home, clinic or office, retail store) - write blood pressure results in a log or diary    Why is this important?   You won't feel high blood pressure, but it can still hurt your blood vessels.  High blood pressure can cause heart or kidney problems. It can also cause a stroke.  Making lifestyle changes like losing a little weight or eating less salt will help.  Checking your blood pressure at home and at different times of the day can help to control blood pressure.  If the doctor prescribes medicine  remember to take it the way the doctor ordered.  Call the office if you cannot afford the medicine or if there are questions about it.     Notes:        Plan:  Patient will monitor blood pressure and document Patient will adhere to medications Patient will not have any falls within the next outreach call RN will follow up within the month of January RN sent update assessment to PCP  [id number] BSN RN [facility]  Care Management [phone]

## 2020-01-13 ENCOUNTER — Other Ambulatory Visit: Payer: Self-pay

## 2020-01-13 DIAGNOSIS — M2550 Pain in unspecified joint: Secondary | ICD-10-CM

## 2020-01-13 MED ORDER — OXYCODONE-ACETAMINOPHEN 10-325 MG PO TABS: 1.0000 | ORAL_TABLET | Freq: Three times a day (TID) | ORAL | 0 refills | Status: DC | PRN

## 2020-01-13 NOTE — Telephone Encounter (Signed)
oxyCODONE-acetaminophen (PERCOCET) 10-325 MG tablet, refill request @  [id number] - , [place] - [address] AT [address] Phone:  [phone]  Fax:  [phone]      

## 2020-02-01 ENCOUNTER — Encounter: Payer: Self-pay | Admitting: Internal Medicine

## 2020-02-01 ENCOUNTER — Ambulatory Visit (INDEPENDENT_AMBULATORY_CARE_PROVIDER_SITE_OTHER): Payer: Medicare Other | Admitting: Internal Medicine

## 2020-02-01 ENCOUNTER — Telehealth: Payer: Self-pay

## 2020-02-01 ENCOUNTER — Other Ambulatory Visit: Payer: Self-pay

## 2020-02-01 DIAGNOSIS — Z1231 Encounter for screening mammogram for malignant neoplasm of breast: Secondary | ICD-10-CM | POA: Diagnosis not present

## 2020-02-01 DIAGNOSIS — Z0000 Encounter for general adult medical examination without abnormal findings: Secondary | ICD-10-CM | POA: Diagnosis not present

## 2020-02-01 NOTE — Telephone Encounter (Signed)
She has an AWV scheduled for today; I will asked Stacee C if she had called pt.

## 2020-02-01 NOTE — Telephone Encounter (Signed)
Pt missed call; pls return call [phone]

## 2020-02-01 NOTE — Patient Instructions (Addendum)
Things That May Be Affecting Your Health:  Alcohol  Hearing loss  Pain    Depression  Home Safety  Sexual Health   Diabetes X Lack of physical activity  Stress   Difficulty with daily activities  Loneliness  Tiredness   Drug use  Medicines  Tobacco use  X Falls  Motor Vehicle Safety  Weight   Food choices  Oral Health  Other    YOUR PERSONALIZED HEALTH PLAN : 1. Schedule your next subsequent Medicare Wellness visit in one year 2. Attend all of your regular appointments to address your medical issues 3. Please call your eye doctor to schedule your annual eye exam and to have your broken glasses repaired. 4.  Please call [facility] at [phone] to schedule your mammogram. 5.  Please call and make an appointment with your Behavioral Health Counselor.  If you change your mind and would like to start seeing Dr. Monna Fam for depression counseling, please call our office at [phone] for an appointment. 6.  A follow up appointment has been scheduled for you with Dr. Criselda Peaches on 03/12/19 at 1045.   Annual Wellness Visit                       Medicare Covered Preventative Screenings and Services  Services & Screenings Men and Women Who How Often Need? Date of Last Service Action  Abdominal Aortic Aneurysm Adults with AAA risk factors Once     Alcohol Misuse and Counseling All Adults Screening once a year if no alcohol misuse. Counseling up to 4 face to face sessions.     Bone Density Measurement  Adults at risk for osteoporosis Once every 2 yrs No 2021 Normal  Lipid Panel Z13.6 All adults without CV disease Once every 5 yrs No 2019   Colorectal Cancer   Stool sample or  Colonoscopy All adults 50 and older   Once every year  Every 10 years Yes 2012 Due 2022  Depression All Adults Once a year  Today   Diabetes Screening Blood glucose, post glucose load, or GTT Z13.1  All adults at risk  Pre-diabetics  Once per year  Twice per year No     Diabetes  Self-Management Training All adults Diabetics 10 hrs first year; 2 hours subsequent years. Requires Copay     Glaucoma  Diabetics  Family history of glaucoma  African Americans 50 yrs +  Hispanic Americans 65 yrs + Annually - requires coppay Yes Please order   Hepatitis C Z72.89 or F19.20  High Risk for HCV  Born between 1945 and 1965  Annually  Once No Done 2012 Negative  HIV Z11.4 All adults based on risk  Annually btw ages 39 & 61 regardless of risk  Annually > 65 yrs if at increased risk No 2013 neg  Lung Cancer Screening Asymptomatic adults aged 76-77 with 30 pack yr history and current smoker OR quit within the last 15 yrs Annually Must have counseling and shared decision making documentation before first screen No    Medical Nutrition Therapy Adults with   Diabetes  Renal disease  Kidney transplant within past 3 yrs 3 hours first year; 2 hours subsequent years     Obesity and Counseling All adults Screening once a year Counseling if BMI 30 or higher Yes Today   Tobacco Use Counseling Adults who use tobacco  Up to 8 visits in one year  No    Vaccines Z23  Hepatitis B  Influenza  Pneumonia  Adults   Once  Once every flu season  Two different vaccines separated by one year Yes  Pneumonia vaccine  Next Annual Wellness Visit People with Medicare Every year  Today     Services & Screenings Women Who How Often Need  Date of Last Service Action  Mammogram  Z12.31 Women over 40 One baseline ages 37-39. Annually ager 40 yrs+ yes 04/2018   Pap tests All women Annually if high risk. Every 2 yrs for normal risk women No 2019 Graduated  Screening for cervical cancer with   Pap (Z01.419 nl or Z01.411abnl) &  HPV Z11.51 Women aged 20 to 69 Once every 5 yrs No    Screening pelvic and breast exams All women Annually if high risk. Every 2 yrs for normal risk women If desired    Sexually Transmitted  Diseases  Chlamydia  Gonorrhea  Syphilis All at risk adults Annually for non pregnant females at increased risk No        Services & Screenings Men Who How Ofter Need  Date of Last Service Action  Prostate Cancer - DRE & PSA Men over 50 Annually.  DRE might require a copay.     Sexually Transmitted Diseases  Syphilis All at risk adults Annually for men at increased risk        Fall Prevention in the Home, Adult Falls can cause injuries. They can happen to people of all ages. There are many things you can do to make your home safe and to help prevent falls. Ask for help when making these changes, if needed. What actions can I take to prevent falls? General Instructions  Use good lighting in all rooms. Replace any light bulbs that burn out.  Turn on the lights when you go into a dark area. Use night-lights.  Keep items that you use often in easy-to-reach places. Lower the shelves around your home if necessary.  Set up your furniture so you have a clear path. Avoid moving your furniture around.  Do not have throw rugs and other things on the floor that can make you trip.  Avoid walking on wet floors.  If any of your floors are uneven, fix them.  Add color or contrast paint or tape to clearly mark and help you see: ? Any grab bars or handrails. ? First and last steps of stairways. ? Where the edge of each step is.  If you use a stepladder: ? Make sure that it is fully opened. Do not climb a closed stepladder. ? Make sure that both sides of the stepladder are locked into place. ? Ask someone to hold the stepladder for you while you use it.  If there are any pets around you, be aware of where they are. What can I do in the bathroom?      Keep the floor dry. Clean up any water that spills onto the floor as soon as it happens.  Remove soap buildup in the tub or shower regularly.  Use non-skid mats or decals on the floor of the tub or shower.  Attach  bath mats securely with double-sided, non-slip rug tape.  If you need to sit down in the shower, use a plastic, non-slip stool.  Install grab bars by the toilet and in the tub and shower. Do not use towel bars as grab bars. What can I do in the bedroom?  Make sure that you have a light by your bed that is easy to reach.  Do not use any sheets or blankets that are too big for your bed. They should not hang down onto the floor.  Have a firm chair that has side arms. You can use this for support while you get dressed. What can I do in the kitchen?  Clean up any spills right away.  If you need to reach something above you, use a strong step stool that has a grab bar.  Keep electrical cords out of the way.  Do not use floor polish or wax that makes floors slippery. If you must use wax, use non-skid floor wax. What can I do with my stairs?  Do not leave any items on the stairs.  Make sure that you have a light switch at the top of the stairs and the bottom of the stairs. If you do not have them, ask someone to add them for you.  Make sure that there are handrails on both sides of the stairs, and use them. Fix handrails that are broken or loose. Make sure that handrails are as long as the stairways.  Install non-slip stair treads on all stairs in your home.  Avoid having throw rugs at the top or bottom of the stairs. If you do have throw rugs, attach them to the floor with carpet tape.  Choose a carpet that does not hide the edge of the steps on the stairway.  Check any carpeting to make sure that it is firmly attached to the stairs. Fix any carpet that is loose or worn. What can I do on the outside of my home?  Use bright outdoor lighting.  Regularly fix the edges of walkways and driveways and fix any cracks.  Remove anything that might make you trip as you walk through a door, such as a raised step or threshold.  Trim any bushes or trees on the path to your home.  Regularly  check to see if handrails are loose or broken. Make sure that both sides of any steps have handrails.  Install guardrails along the edges of any raised decks and porches.  Clear walking paths of anything that might make someone trip, such as tools or rocks.  Have any leaves, snow, or ice cleared regularly.  Use sand or salt on walking paths during winter.  Clean up any spills in your garage right away. This includes grease or oil spills. What other actions can I take?  Wear shoes that: ? Have a low heel. Do not wear high heels. ? Have rubber bottoms. ? Are comfortable and fit you well. ? Are closed at the toe. Do not wear open-toe sandals.  Use tools that help you move around (mobility aids) if they are needed. These include: ? Canes. ? Walkers. ? Scooters. ? Crutches.  Review your medicines with your doctor. Some medicines can make you feel dizzy. This can increase your chance of falling. Ask your doctor what other things you can do to help prevent falls. Where to find more information  Centers for Disease Control and Prevention, [employer]: HealthcareCounselor.com.pt  [employer] on Aging: RingConnections.si Contact a doctor if:  You are afraid of falling at home.  You feel weak, drowsy, or dizzy at home.  You fall at home. Summary  There are many simple things that you can do to make your home safe and to help prevent falls.  Ways to make your home safe include removing tripping hazards and installing grab bars in the bathroom.  Ask for help when making  these changes in your home. This information is not intended to replace advice given to you by your health care provider. Make sure you discuss any questions you have with your health care provider. Document Revised: 06/05/2018 Document Reviewed: 09/27/2016 Elsevier Patient Education  2020 [employer]   Health Maintenance, Female Adopting a healthy lifestyle and getting preventive care are important in  promoting health and wellness. Ask your health care provider about:  The right schedule for you to have regular tests and exams.  Things you can do on your own to prevent diseases and keep yourself healthy. What should I know about diet, weight, and exercise? Eat a healthy diet   Eat a diet that includes plenty of vegetables, fruits, low-fat dairy products, and lean protein.  Do not eat a lot of foods that are high in solid fats, added sugars, or sodium. Maintain a healthy weight Body mass index (BMI) is used to identify weight problems. It estimates body fat based on height and weight. Your health care provider can help determine your BMI and help you achieve or maintain a healthy weight. Get regular exercise Get regular exercise. This is one of the most important things you can do for your health. Most adults should:  Exercise for at least 150 minutes each week. The exercise should increase your heart rate and make you sweat (moderate-intensity exercise).  Do strengthening exercises at least twice a week. This is in addition to the moderate-intensity exercise.  Spend less time sitting. Even light physical activity can be beneficial. Watch cholesterol and blood lipids Have your blood tested for lipids and cholesterol at 67 years of age, then have this test every 5 years. Have your cholesterol levels checked more often if:  Your lipid or cholesterol levels are high.  You are older than 67 years of age.  You are at high risk for heart disease. What should I know about cancer screening? Depending on your health history and family history, you may need to have cancer screening at various ages. This may include screening for:  Breast cancer.  Cervical cancer.  Colorectal cancer.  Skin cancer.  Lung cancer. What should I know about heart disease, diabetes, and high blood pressure? Blood pressure and heart disease  High blood pressure causes heart disease and increases the  risk of stroke. This is more likely to develop in people who have high blood pressure readings, are of African descent, or are overweight.  Have your blood pressure checked: ? Every 3-5 years if you are 218-67 years of age. ? Every year if you are 67 years old or older. Diabetes Have regular diabetes screenings. This checks your fasting blood sugar level. Have the screening done:  Once every three years after age 67 if you are at a normal weight and have a low risk for diabetes.  More often and at a younger age if you are overweight or have a high risk for diabetes. What should I know about preventing infection? Hepatitis B If you have a higher risk for hepatitis B, you should be screened for this virus. Talk with your health care provider to find out if you are at risk for hepatitis B infection. Hepatitis C Testing is recommended for:  Everyone born from [id number] through 1963.  Anyone with known risk factors for hepatitis C. Sexually transmitted infections (STIs)  Get screened for STIs, including gonorrhea and chlamydia, if: ? You are sexually active and are younger than 67 years of age. ? You are  older than 67 years of age and your health care provider tells you that you are at risk for this type of infection. ? Your sexual activity has changed since you were last screened, and you are at increased risk for chlamydia or gonorrhea. Ask your health care provider if you are at risk.  Ask your health care provider about whether you are at high risk for HIV. Your health care provider may recommend a prescription medicine to help prevent HIV infection. If you choose to take medicine to prevent HIV, you should first get tested for HIV. You should then be tested every 3 months for as long as you are taking the medicine. Pregnancy  If you are about to stop having your period (premenopausal) and you may become pregnant, seek counseling before you get pregnant.  Take 400 to 800 micrograms (mcg) of  folic acid every day if you become pregnant.  Ask for birth control (contraception) if you want to prevent pregnancy. Osteoporosis and menopause Osteoporosis is a disease in which the bones lose minerals and strength with aging. This can result in bone fractures. If you are 76 years old or older, or if you are at risk for osteoporosis and fractures, ask your health care provider if you should:  Be screened for bone loss.  Take a calcium or vitamin D supplement to lower your risk of fractures.  Be given hormone replacement therapy (HRT) to treat symptoms of menopause. Follow these instructions at home: Lifestyle  Do not use any products that contain nicotine or tobacco, such as cigarettes, e-cigarettes, and chewing tobacco. If you need help quitting, ask your health care provider.  Do not use street drugs.  Do not share needles.  Ask your health care provider for help if you need support or information about quitting drugs. Alcohol use  Do not drink alcohol if: ? Your health care provider tells you not to drink. ? You are pregnant, may be pregnant, or are planning to become pregnant.  If you drink alcohol: ? Limit how much you use to 0-1 drink a day. ? Limit intake if you are breastfeeding.  Be aware of how much alcohol is in your drink. In the [place] one drink equals one 12 oz bottle of beer (355 mL), one 5 oz glass of wine (148 mL), or one 1 oz glass of hard liquor (44 mL). General instructions  Schedule regular health, dental, and eye exams.  Stay current with your vaccines.  Tell your health care provider if: ? You often feel depressed. ? You have ever been abused or do not feel safe at home. Summary  Adopting a healthy lifestyle and getting preventive care are important in promoting health and wellness.  Follow your health care provider's instructions about healthy diet, exercising, and getting tested or screened for diseases.  Follow your health care provider's  instructions on monitoring your cholesterol and blood pressure. This information is not intended to replace advice given to you by your health care provider. Make sure you discuss any questions you have with your health care provider. Document Revised: 02/05/2018 Document Reviewed: 02/05/2018 Elsevier Patient Education  2020 [employer].   Mammogram A mammogram is a low energy X-ray of the breasts that is done to check for abnormal changes. This procedure can screen for and detect any changes that may indicate breast cancer. Mammograms are regularly done on women. A man may have a mammogram if he has a lump or swelling in his breast. A mammogram  can also identify other changes and variations in the breast, such as:  Inflammation of the breast tissue (mastitis).  An infected area that contains a collection of pus (abscess).  A fluid-filled sac (cyst).  Fibrocystic changes. This is when breast tissue becomes denser, which can make the tissue feel rope-like or uneven under the skin.  Tumors that are not cancerous (benign). Tell a health care provider:  About any allergies you have.  If you have breast implants.  If you have had previous breast disease, biopsy, or surgery.  If you are breastfeeding.  If you are younger than age 11.  If you have a family history of breast cancer.  Whether you are pregnant or may be pregnant. What are the risks? Generally, this is a safe procedure. However, problems may occur, including:  Exposure to radiation. Radiation levels are very low with this test.  The results being misinterpreted.  The need for further tests.  The inability of the mammogram to detect certain cancers. What happens before the procedure?  Schedule your test about 1-2 weeks after your menstrual period if you are still menstruating. This is usually when your breasts are the least tender.  If you have had a mammogram done at a different facility in the past, get the  mammogram X-rays or have them sent to your current exam facility. The new and old images will be compared.  Wash your breasts and underarms on the day of the test.  Do not wear deodorants, perfumes, lotions, or powders anywhere on your body on the day of the test.  Remove any jewelry from your neck.  Wear clothes that you can change into and out of easily. What happens during the procedure?   You will undress from the waist up and put on a gown that opens in the front.  You will stand in front of the X-ray machine.  Each breast will be placed between two plastic or glass plates. The plates will compress your breast for a few seconds. Try to stay as relaxed as possible during the procedure. This does not cause any harm to your breasts and any discomfort you feel will be very brief.  X-rays will be taken from different angles of each breast. The procedure may vary among health care providers and hospitals. What happens after the procedure?  The mammogram will be examined by a specialist (radiologist).  You may need to repeat certain parts of the test, depending on the quality of the images. This is commonly done if the radiologist needs a better view of the breast tissue.  You may resume your normal activities.  It is up to you to get the results of your procedure. Ask your health care provider, or the department that is doing the procedure, when your results will be ready. Summary  A mammogram is a low energy X-ray of the breasts that is done to check for abnormal changes. A man may have a mammogram if he has a lump or swelling in his breast.  If you have had a mammogram done at a different facility in the past, get the mammogram X-rays or have them sent to your current exam facility in order to compare them.  Schedule your test about 1-2 weeks after your menstrual period if you are still menstruating.  For this test, each breast will be placed between two plastic or glass plates.  The plates will compress your breast for a few seconds.  Ask when your test results will  be ready. Make sure you get your test results. This information is not intended to replace advice given to you by your health care provider. Make sure you discuss any questions you have with your health care provider. Document Revised: 10/03/2017 Document Reviewed: 10/03/2017 Elsevier Patient Education  2020 [employer].

## 2020-02-01 NOTE — Progress Notes (Signed)
This AWV is being conducted by [place] only. The patient was located at home and I was located in [facility]. The patient's identity was confirmed using their DOB and current address. The patient or his/her legal guardian has consented to being evaluated through a telephone encounter and understands the associated risks (an examination cannot be done and the patient may need to come in for an appointment) / benefits (allows the patient to remain at home, decreasing exposure to coronavirus). I personally spent 38 minutes conducting the AWV.  Subjective:   Vanessa Palmer is a 69 y.o. female who presents for a Medicare Annual Wellness Visit.  The following items have been reviewed and updated today in the appropriate area in the EMR.   Health Risk Assessment  Height, weight, BMI, and BP Visual acuity if needed Depression screen Fall risk / safety level Advance directive discussion Medical and family history were reviewed and updated Updating list of other providers & suppliers Medication reconciliation, including over the counter medicines Cognitive screen Written screening schedule Risk Factor list Personalized health advice, risky behaviors, and treatment advice  Social History   Social History Narrative   Current Social History 02/01/2020        Patient lives alone in a/an apartment which is 1 story. There are not steps up to the entrance the patient uses.       Patient's method of transportation is via family member and the city bus.      The highest level of education was GED school diploma.      The patient currently disabled.      Identified important Relationships are Granddaughter       Pets : None       Interests / Fun: "I like to Crochet"       Current Stressors: "My best friend passed away in 12-28-2022"             Objective:    Vitals: There were no vitals taken for this visit. Vitals are unable to obtained due to OUZHQ-60 public health  emergency  Activities of Daily Living In your present state of health, do you have any difficulty performing the following activities: 12/11/2019 08/12/2019  Hearing? N N  Comment - -  Vision? N N  Difficulty concentrating or making decisions? N N  Comment - -  Walking or climbing stairs? Y Y  Comment - -  Dressing or bathing? Y Y  Comment - -  Doing errands, shopping? [place] and eating ? - -  Comment - -  Using the Toilet? - -  In the past six months, have you accidently leaked urine? - -  Comment - -  Do you have problems with loss of bowel control? - -  Managing your Medications? - -  Managing your Finances? - -  Housekeeping or managing your Housekeeping? - -  Comment - -  Some recent data might be hidden    Goals Goals    .  (THN)Eat Healthy      Follow Up Date [id number]   - set goal weight - drink 6 to 8 glasses of water each day - manage portion size - set a realistic goal    Why is this important?   When you are ready to manage your nutrition or weight, having a plan and setting goals will help.  Taking small steps to change how you eat and exercise is a good  place to start.    Notes:  RN discussed frequent small feedings to help increase her metabolism Discussed low sodium diet    .  (THN)Make and Keep All Appointments      Follow Up Date [id number]   - call to cancel if needed - keep a calendar with prescription refill dates - keep a calendar with appointment dates    Why is this important?   Part of staying healthy is seeing the doctor for follow-up care.  If you forget your appointments, there are some things you can do to stay on track.    Notes:     .  (THN)Track and Manage My Blood Pressure      Follow Up Date [id number]   - check blood pressure daily - choose a place to take my blood pressure (home, clinic or office, retail store) - write blood pressure results in a log or diary    Why is this important?   You  won't feel high blood pressure, but it can still hurt your blood vessels.  High blood pressure can cause heart or kidney problems. It can also cause a stroke.  Making lifestyle changes like losing a little weight or eating less salt will help.  Checking your blood pressure at home and at different times of the day can help to control blood pressure.  If the doctor prescribes medicine remember to take it the way the doctor ordered.  Call the office if you cannot afford the medicine or if there are questions about it.     Notes:     .  Blood Pressure < 140/90      CARE PLAN ENTRY (see longtitudinal plan of care for additional care plan information)  Objective:  . Last practice recorded BP readings:  BP Readings from Last 3 Encounters:  08/12/19 115/69  08/10/19 (!) 178/101  07/23/19 (!) 142/80 .   Marland Kitchen Most recent eGFR/CrCl: No results found for: EGFR  No components found for: CRCL  Current Barriers:  Marland Kitchen Knowledge Deficits related to basic understanding of hypertension diagnosis . Knowledge deficit related to self care management of hypertension . Knowledge deficit related to dangers of uncontrolled hypertension . Literacy barriers . Cognitive Deficits  Case Manager Clinical Goal(s):  Marland Kitchen Over the next 90 days, patient will verbalize understanding of plan for hypertension management . Over the next 90 days, patient will attend all scheduled medical appointments: PCP and eye exam . Over the next 90 days, patient will demonstrate improved adherence to prescribed treatment plan for hypertension as evidenced by taking all medications as prescribed, monitoring and recording blood pressure as directed, adhering to low sodium/DASH diet . Over the next 90 days, patient will demonstrate improved health management independence as evidenced by checking blood pressure as directed and notifying PCP if SBP>180 or DBP > 110, taking all medications as prescribe, and adhering to a low sodium diet as  discussed. . Over the next 90 days, patient will verbalize basic understanding of hypertension disease process and self health management plan as evidenced by bp<140/90.  Interventions:  . Evaluation of current treatment plan related to hypertension self management and patient's adherence to plan as established by provider. . Discussed plans with patient for ongoing care management follow up and provided patient with direct contact information for care management team . Reviewed scheduled/upcoming provider appointments including:  . Provided education regarding s/s of stroke and stroke prevention . Provided education regarding s/s of heart attack  Patient Self Care  Activities:  . Self administers medications as prescribed . Attends all scheduled provider appointments . Monitors BP and records as discussed . Adheres to a low sodium diet/DASH diet . Increase physical activity as tolerated  Initial goal documentation      .  Increase physical activity (pt-stated)      Increase leg strength; "legs give out on me"    .  Weight < 230 lb (104.3 kg)       Fall Risk Fall Risk  02/01/2020 01/01/2020 12/11/2019 10/16/2019 08/12/2019  Falls in the past year? 0 _0 Number falls in past yr: - _1 Comment - - - - -  Injury with Fall? - 0 0 0 0  Risk Factor Category  - - - - -  Comment - - - - -  Risk for fall due to : - History of fall(s);Impaired balance/gait;Impaired mobility Impaired balance/gait;Impaired mobility History of fall(s);Impaired balance/gait;Impaired mobility History of fall(s);Impaired balance/gait;Impaired mobility  Risk for fall due to: Comment - - - - -  Follow up Falls evaluation completed Falls prevention discussed;Falls evaluation completed Falls prevention discussed Falls evaluation completed Falls prevention discussed    Depression Screen PHQ 2/9 Scores 02/01/2020 12/11/2019 08/12/2019 07/23/2019  PHQ - 2 Score _2 PHQ- 9 Score _3 Cognitive  Testing Six-Item Cognitive Screener   "I would like to ask you some questions that ask you to use your memory. I am going to name three objects. Please wait until I say all three words, then repeat them. Remember what they are  because I am going to ask you to name them again in a few minutes. Please repeat these words for me: APPLE--TABLE--PENNY." (Interviewer may repeat names 3 times if necessary but repetition not scored.)  Did patient correctly repeat all three words? Yes - may proceed with screen  What year is this? Correct What month is this? Correct What day of the week is this? Correct  What were the three objects I asked you to remember? . Apple Correct . Table Correct . Penny Correct  Score one point for each incorrect answer.  A score of 2 or more points warrants additional investigation.  Patient's score 0    CDC Handout on Fall Prevention and Handout on Home Exercise Program, Access codes [id number] and [email or web address] given/mailed to patient with exercise band.      Assessment and Plan:     During the course of the visit the patient was educated and counseled about appropriate screening and preventive services as documented in the assessment and plan.  The PHQ9 score was 14 and the patient was offered a referral/appointment with Dr. Theodis Shove which she declined.  The patient stated that she was going to make an appointment with a counselor soon, she denied any suicidal or homicidal ideation.  She states her best friend passed away in December 21, 2022 and she is still grieving.    Patient states her eyeglasses recently broke and she will make an appointment with her eye doctor for a follow up.  She is also due for a 3 month f/u with her PCP, Dr. Daryll Drown, this appointment was made today for 03/12/19 @ 1045.  An order was also placed for a mammogram.  Patient's goal is to increase strength in her legs, an exercise band with instructions were mailed to the patient today.  The printed AVS was  given to the patient and included  an updated screening schedule, a list of risk factors, and personalized health advice.        Higinio Roger, RN  02/01/2020

## 2020-02-02 ENCOUNTER — Other Ambulatory Visit: Payer: Self-pay

## 2020-02-02 MED ORDER — EPINEPHRINE 0.3 MG/0.3ML IJ SOAJ: 0.3000 mg | Freq: Once | INTRAMUSCULAR | 1 refills | Status: DC | PRN

## 2020-02-02 NOTE — Telephone Encounter (Signed)
Patient request a refill on her epi-pen during AWV yesterday, her pen expired.  Pharmacist states they will need a new RX. Thank you, SChaplin, RN,BSN

## 2020-02-05 NOTE — Progress Notes (Signed)
Internal Medicine Clinic Attending  I have reviewed the AWV findings.  I agree with the assessment, diagnosis, and plan of care documented in the AWV note.     

## 2020-02-11 ENCOUNTER — Other Ambulatory Visit: Payer: Self-pay

## 2020-02-11 DIAGNOSIS — M2550 Pain in unspecified joint: Secondary | ICD-10-CM

## 2020-02-11 MED ORDER — OXYCODONE-ACETAMINOPHEN 10-325 MG PO TABS: 1.0000 | ORAL_TABLET | Freq: Three times a day (TID) | ORAL | 0 refills | Status: DC | PRN

## 2020-02-11 NOTE — Telephone Encounter (Signed)
Need refill on  oxyCODONE-acetaminophen (PERCOCET) 10-325 MG tablet  ;pt contact [phone]   [id number] - [place], [place] - [address] AT [facility] [address]

## 2020-02-16 DIAGNOSIS — M1990 Unspecified osteoarthritis, unspecified site: Secondary | ICD-10-CM | POA: Diagnosis not present

## 2020-02-16 DIAGNOSIS — M2550 Pain in unspecified joint: Secondary | ICD-10-CM | POA: Diagnosis not present

## 2020-02-16 DIAGNOSIS — G4733 Obstructive sleep apnea (adult) (pediatric): Secondary | ICD-10-CM | POA: Diagnosis not present

## 2020-03-09 DIAGNOSIS — Z7289 Other problems related to lifestyle: Secondary | ICD-10-CM | POA: Diagnosis not present

## 2020-03-09 DIAGNOSIS — Z993 Dependence on wheelchair: Secondary | ICD-10-CM | POA: Diagnosis not present

## 2020-03-09 DIAGNOSIS — M1990 Unspecified osteoarthritis, unspecified site: Secondary | ICD-10-CM | POA: Diagnosis not present

## 2020-03-09 DIAGNOSIS — I10 Essential (primary) hypertension: Secondary | ICD-10-CM | POA: Diagnosis not present

## 2020-03-10 ENCOUNTER — Other Ambulatory Visit: Payer: Self-pay | Admitting: Internal Medicine

## 2020-03-11 ENCOUNTER — Ambulatory Visit (INDEPENDENT_AMBULATORY_CARE_PROVIDER_SITE_OTHER): Payer: Medicare Other | Admitting: Internal Medicine

## 2020-03-11 ENCOUNTER — Encounter: Payer: Self-pay | Admitting: Internal Medicine

## 2020-03-11 ENCOUNTER — Other Ambulatory Visit: Payer: Self-pay

## 2020-03-11 DIAGNOSIS — M2550 Pain in unspecified joint: Secondary | ICD-10-CM | POA: Diagnosis not present

## 2020-03-11 DIAGNOSIS — Z0000 Encounter for general adult medical examination without abnormal findings: Secondary | ICD-10-CM

## 2020-03-11 DIAGNOSIS — Z6841 Body Mass Index (BMI) 40.0 and over, adult: Secondary | ICD-10-CM

## 2020-03-11 DIAGNOSIS — I208 Other forms of angina pectoris: Secondary | ICD-10-CM

## 2020-03-11 DIAGNOSIS — R600 Localized edema: Secondary | ICD-10-CM | POA: Diagnosis not present

## 2020-03-11 DIAGNOSIS — F331 Major depressive disorder, recurrent, moderate: Secondary | ICD-10-CM

## 2020-03-11 DIAGNOSIS — I10 Essential (primary) hypertension: Secondary | ICD-10-CM | POA: Diagnosis not present

## 2020-03-11 DIAGNOSIS — I82403 Acute embolism and thrombosis of unspecified deep veins of lower extremity, bilateral: Secondary | ICD-10-CM | POA: Diagnosis not present

## 2020-03-11 MED ORDER — NITROGLYCERIN 0.4 MG SL SUBL: 0.4000 mg | SUBLINGUAL_TABLET | SUBLINGUAL | 1 refills | Status: DC | PRN

## 2020-03-11 MED ORDER — OXYCODONE-ACETAMINOPHEN 10-325 MG PO TABS: 1.0000 | ORAL_TABLET | Freq: Three times a day (TID) | ORAL | 0 refills | Status: DC | PRN

## 2020-03-11 MED ORDER — SERTRALINE HCL 50 MG PO TABS
50.0000 mg | ORAL_TABLET | Freq: Every day | ORAL | 1 refills | Status: DC
Start: 2020-03-11 — End: 2020-08-24

## 2020-03-11 NOTE — Progress Notes (Signed)
   Subjective:    Patient ID: Vanessa Palmer, female    DOB: 04-Jul-1950, 70 y.o.   MRN: [id number]  CC: 3 month follow up for Depression, HTN  HPI  Vanessa Palmer is a 70 year old woman with PMH Of HLD, OSA, OA, Asthma, CKD, DVT on chronic xarelto, HTN, MDD on sertraline who presents for follow up.    Vanessa Palmer reports that she is doing relatively well.  She still has low moments and symptoms of depression.  She is on the lowest dose of Sertraline and interested in increasing this today.   She has chronic LE swelling which is from a history of DVTs and venous insufficiency.  She is not able to raise her legs when sitting because it exacerbates her chronic back pain.  She has tried to get compression stockings, but they did not have her size at the time.  She is not able to travel to the Lymphedema clinic as it is in [place].    She brought in her medications today and we reviewed them.  She is requesting a refill of her chronic pain medications and her nitroglycerin.  She has not had any chest pain recently per her.   Review of Systems  Constitutional: Negative for activity change, appetite change, fatigue and fever.  Respiratory: Negative for cough and shortness of breath.   Cardiovascular: Positive for leg swelling. Negative for chest pain and palpitations.  Musculoskeletal: Positive for arthralgias, back pain and gait problem.  Skin: Negative for color change and pallor.  Neurological: Negative for dizziness and weakness.  Psychiatric/Behavioral: Positive for dysphoric mood. Negative for decreased concentration.       Objective:   Physical Exam Vitals and nursing note reviewed.  Constitutional:      General: She is not in acute distress.    Appearance: She is obese. She is not toxic-appearing.  HENT:     Head: Normocephalic and atraumatic.  Cardiovascular:     Rate and Rhythm: Normal rate and regular rhythm.     Heart sounds: No murmur heard. No gallop.   Pulmonary:      Effort: Pulmonary effort is normal. No respiratory distress.     Breath sounds: Normal breath sounds. No wheezing.  Musculoskeletal:        General: Swelling (non pitting) and tenderness (to palpation) present. No deformity or signs of injury.     Right lower leg: Edema present.     Left lower leg: Edema present.  Skin:    General: Skin is warm and dry.     Coloration: Skin is not jaundiced.  Neurological:     Mental Status: She is alert and oriented to person, place, and time. Mental status is at baseline.  Psychiatric:        Behavior: Behavior normal.     Comments: Chronic downward gaze, unchanged, mood is low           Assessment & Plan:  Return in 3 months, sooner if needed.

## 2020-03-11 NOTE — Patient Instructions (Signed)
Vanessa Palmer - -  Please INCREASE your sertraline to 50mg  daily and we will recheck your mood and sleep at next visit.   You are due for your colonoscopy this year.  If they haven't scheduled for you at next visit, we will get an appointment for you.   Take Care  Come back to see me in 3 months.

## 2020-03-15 NOTE — Assessment & Plan Note (Signed)
This is chronic and controlled.  She is on Xarelto.  Given her history of DVTs, she also has chronic venous insufficiency and lower extremity swelling.  She has been unable to do lifestyle modifications and reports inability to go to Lymphedema clinic in [place], [place].  She may benefit from a lymphedema device at home or compression stockings.    Further effort will need to be made to get her supplies at home and/or a single visit at the Lymphedema clinic for assessment.  At next visit consider d/c amlodipine.

## 2020-03-15 NOTE — Assessment & Plan Note (Signed)
Colonoscopy is due.  I advised her to contact her GI office.

## 2020-03-15 NOTE — Assessment & Plan Note (Signed)
BP today was modestly well controlled at 138/80.  She is taking amlodipine, lasix, hydralazine, losartan and metoprolol and has had modestly difficult to control BP in the past.  She has CKD, so her goal should be lower.  Given her lymphedema, we may consider changing her amlodipine to a different agent in the future.   Continue 5 drug regimen Consider stopping amlodipine and starting spironolactone in the future.  She is allergic to ACE-I

## 2020-03-15 NOTE — Assessment & Plan Note (Signed)
She has only modest effect with sertraline 25mg .  Increase to 50mg  and reassess at next visit.

## 2020-03-15 NOTE — Assessment & Plan Note (Signed)
She is due for a refill on her pain medication.  She has been adherent to the contract.  PDMP appropriate.  Refill provided today.

## 2020-03-15 NOTE — Assessment & Plan Note (Signed)
Refill NTG.  No change to her chronic angina.

## 2020-03-15 NOTE — Assessment & Plan Note (Signed)
Will plan to continue working on lifestyle endeavors and will also work on other options such as lymphedema clinic and stopping amlodipine at next visit.

## 2020-03-17 DIAGNOSIS — H43811 Vitreous degeneration, right eye: Secondary | ICD-10-CM | POA: Diagnosis not present

## 2020-03-17 DIAGNOSIS — H2513 Age-related nuclear cataract, bilateral: Secondary | ICD-10-CM | POA: Diagnosis not present

## 2020-03-17 DIAGNOSIS — H1045 Other chronic allergic conjunctivitis: Secondary | ICD-10-CM | POA: Diagnosis not present

## 2020-03-17 DIAGNOSIS — H04123 Dry eye syndrome of bilateral lacrimal glands: Secondary | ICD-10-CM | POA: Diagnosis not present

## 2020-03-17 DIAGNOSIS — H524 Presbyopia: Secondary | ICD-10-CM | POA: Diagnosis not present

## 2020-03-21 DIAGNOSIS — H5213 Myopia, bilateral: Secondary | ICD-10-CM | POA: Diagnosis not present

## 2020-03-21 DIAGNOSIS — M1990 Unspecified osteoarthritis, unspecified site: Secondary | ICD-10-CM | POA: Diagnosis not present

## 2020-03-21 DIAGNOSIS — G4733 Obstructive sleep apnea (adult) (pediatric): Secondary | ICD-10-CM | POA: Diagnosis not present

## 2020-03-21 DIAGNOSIS — M2550 Pain in unspecified joint: Secondary | ICD-10-CM | POA: Diagnosis not present

## 2020-03-23 ENCOUNTER — Other Ambulatory Visit: Payer: Self-pay | Admitting: *Deleted

## 2020-03-23 ENCOUNTER — Other Ambulatory Visit: Payer: Self-pay | Admitting: Internal Medicine

## 2020-03-23 DIAGNOSIS — R262 Difficulty in walking, not elsewhere classified: Secondary | ICD-10-CM | POA: Diagnosis not present

## 2020-03-23 DIAGNOSIS — M6281 Muscle weakness (generalized): Secondary | ICD-10-CM | POA: Diagnosis not present

## 2020-03-26 NOTE — Patient Instructions (Signed)
Goals Addressed            This Visit's Progress   . (THN)Eat Healthy       Timeframe:  Long-Range Goal Priority:  Medium Start Date:     [id number]                        Expected End Date:  [id number]                    Follow Up Date [id number]   - set goal weight - drink 6 to 8 glasses of water each day - manage portion size - set a realistic goal    Why is this important?   When you are ready to manage your nutrition or weight, having a plan and setting goals will help.  Taking small steps to change how you eat and exercise is a good place to start.    Notes:  RN discussed frequent small feedings to help increase her metabolism Discussed low sodium diet    . (THN)Make and Keep All Appointments       Timeframe:  Long-Range Goal Priority:  Medium Start Date:   [id number]                          Expected End Date:   75643329                   Follow Up Date [id number]   - call to cancel if needed - keep a calendar with prescription refill dates - keep a calendar with appointment dates    Why is this important?   Part of staying healthy is seeing the doctor for follow-up care.  If you forget your appointments, there are some things you can do to stay on track.    Notes:  RN scheduled patient with [name] to get homebound COVID booster    . (THN)Track and Manage My Blood Pressure       Timeframe:  Short-Term Goal Priority:  High Start Date: 06301601                            Expected End Date:       09323557                Follow Up Date 32202542   - check blood pressure daily - choose a place to take my blood pressure (home, clinic or office, retail store) - write blood pressure results in a log or diary    Why is this important?   You won't feel high blood pressure, but it can still hurt your blood vessels.  High blood pressure can cause heart or kidney problems. It can also cause a stroke.  Making lifestyle changes like losing a little weight or eating less  salt will help.  Checking your blood pressure at home and at different times of the day can help to control blood pressure.  If the doctor prescribes medicine remember to take it the way the doctor ordered.  Call the office if you cannot afford the medicine or if there are questions about it.     Notes:  RN discussed checking bp and documenting RN sent calendar book for documentation

## 2020-03-26 NOTE — Patient Outreach (Signed)
[facility]) Care Management  [facility] Care Manager  03/26/2020   Vanessa Palmer 12-14-1950 [id number]  RN Health Coach telephone call to patient.  Hipaa compliance verified. Per patient her legs are swelling when she gets up.  She is keeping them elevated as much as possible. Patient stated she has gained 2  pounds from fluid. RN discussed limiting sodium intake. RN discussed with patient about compression hose.   Per patient she has not gotten her booster vaccine. Patient has agreed to follow up outreach calls.   Encounter Medications:  Outpatient Encounter Medications as of 03/23/2020  Medication Sig  . amLODipine (NORVASC) 5 MG tablet Take 5 mg by mouth daily.  Marland Kitchen atorvastatin (LIPITOR) 40 MG tablet Take 1 tablet (40 mg total) by mouth daily.  . cyclobenzaprine (FLEXERIL) 5 MG tablet Take 1 tablet (5 mg total) by mouth 3 (three) times daily as needed for muscle spasms.  Marland Kitchen EPINEPHrine 0.3 mg/0.3 mL IJ SOAJ injection Inject 0.3 mg into the muscle once as needed for anaphylaxis.  Marland Kitchen FLOVENT HFA 44 MCG/ACT inhaler INHALE TWO PUFFS INTO THE LUNGS TWICE A DAY (Patient taking differently: Inhale 2 puffs into the lungs 2 (two) times daily. )  . fluticasone (FLONASE) 50 MCG/ACT nasal spray SHAKE LIQUID AND USE 2 SPRAYS IN EACH NOSTRIL DAILY AS NEEDED FOR ALLERGIES (Patient taking differently: Place 2 sprays into both nostrils daily as needed for allergies. )  . furosemide (LASIX) 40 MG tablet Take 1 tablet (40 mg total) by mouth daily.  . hydrALAZINE (APRESOLINE) 50 MG tablet TAKE ONE TABLET BY MOUTH TWICE A DAY  . losartan (COZAAR) 25 MG tablet TAKE 1 TABLET(25 MG) BY MOUTH DAILY  . metoprolol succinate (TOPROL-XL) 50 MG 24 hr tablet Take 50 mg by mouth daily.  . nitroGLYCERIN (NITROSTAT) 0.4 MG SL tablet Place 1 tablet (0.4 mg total) under the tongue every 5 (five) minutes as needed for chest pain.  Marland Kitchen omeprazole (PRILOSEC) 20 MG capsule TAKE ONE CAPSULE BY MOUTH DAILY  . oxybutynin  (DITROPAN) 5 MG tablet Take 1 tablet (5 mg total) by mouth 2 (two) times daily.  Marland Kitchen oxyCODONE-acetaminophen (PERCOCET) 10-325 MG tablet Take 1 tablet by mouth every 8 (eight) hours as needed for pain (chronic arthritis pain, multiple sites, diag code M16.11, M25.50).  Marland Kitchen sertraline (ZOLOFT) 50 MG tablet Take 1 tablet (50 mg total) by mouth at bedtime.  Vanessa Palmer 20 MG TABS tablet TAKE ONE TABLET BY MOUTH DAILY WITH SUPPER   No facility-administered encounter medications on file as of 03/23/2020.    Functional Status:  In your present state of health, do you have any difficulty performing the following activities: 03/11/2020 12/11/2019  Hearing? N N  Comment - -  Vision? N N  Difficulty concentrating or making decisions? N N  Comment - -  Walking or climbing stairs? Y Y  Comment - -  Dressing or bathing? Y Y  Comment - -  Doing errands, shopping? Y Y  Comment - -  Preparing Food and eating ? - -  Comment - -  Using the Toilet? - -  In the past six months, have you accidently leaked urine? - -  Comment - -  Do you have problems with loss of bowel control? - -  Managing your Medications? - -  Managing your Finances? - -  Housekeeping or managing your Housekeeping? - -  Comment - -  Some recent data might be hidden    Fall/Depression Screening: Fall  Risk  03/23/2020 03/11/2020 02/01/2020  Falls in the past year? 1 1 0  Number falls in past yr: 1 1 -  Comment - - -  Injury with Fall? 0 0 -  Risk Factor Category  - - -  Comment - - -  Risk for fall due to : History of fall(s);Impaired balance/gait;Impaired mobility Impaired mobility;Impaired balance/gait -  Risk for fall due to: Comment - - -  Follow up Falls evaluation completed Falls prevention discussed Falls evaluation completed   PHQ 2/9 Scores 03/11/2020 02/01/2020 12/11/2019 08/12/2019 07/23/2019 07/13/2019 07/09/2019  PHQ - 2 Score - 3 4 5 3 3 4   PHQ- 9 Score - 14 13 18 15 15 11   Exception Documentation Patient refusal - - - - -  -    Assessment:  Goals Addressed            This Visit's Progress   . (THN)Eat Healthy       Timeframe:  Long-Range Goal Priority:  Medium Start Date:                            Expected End Date:                     Follow Up Date [id number]   - set goal weight - drink 6 to 8 glasses of water each day - manage portion size - set a realistic goal    Why is this important?   When you are ready to manage your nutrition or weight, having a plan and setting goals will help.  Taking small steps to change how you eat and exercise is a good place to start.    Notes:  RN discussed frequent small feedings to help increase her metabolism Discussed low sodium diet    . (THN)Make and Keep All Appointments       Timeframe:  Long-Range Goal Priority:  Medium Start Date:   [id number]                          Expected End Date:   [id number]                   Follow Up Date [id number]   - call to cancel if needed - keep a calendar with prescription refill dates - keep a calendar with appointment dates    Why is this important?   Part of staying healthy is seeing the doctor for follow-up care.  If you forget your appointments, there are some things you can do to stay on track.    Notes:  RN scheduled patient with [id number] pharmacy to get homebound COVID booster    . (THN)Track and Manage My Blood Pressure       Timeframe:  Short-Term Goal Priority:  High Start Date: [id number]                            Expected End Date:       [facility]essa [facility]                Follow Up Date [id number]   - check blood pressure daily - choose a place to take my blood pressure (home, clinic or office, retail store) - write blood pressure results in a log or diary    Why is this important?   You won't feel  high blood pressure, but it can still hurt your blood vessels.  High blood pressure can cause heart or kidney problems. It can also cause a stroke.  Making lifestyle changes like losing a little  weight or eating less salt will help.  Checking your blood pressure at home and at different times of the day can help to control blood pressure.  If the doctor prescribes medicine remember to take it the way the doctor ordered.  Call the office if you cannot afford the medicine or if there are questions about it.     Notes:  RN discussed checking bp and documenting RN sent calendar book for documentation       Plan:  Follow-up:  Patient agrees to Care Plan and Follow-up. RN sent information for COVID test kits RN [facility] to [facility] Homebound COVID booster Provided education on Compression hose RN will follow up within the month of April RN will send update assessment to PCP  Gean Maidens BSN RN [facility] Care Management [phone]

## 2020-04-07 ENCOUNTER — Other Ambulatory Visit: Payer: Self-pay

## 2020-04-07 DIAGNOSIS — M2550 Pain in unspecified joint: Secondary | ICD-10-CM

## 2020-04-07 MED ORDER — OXYCODONE-ACETAMINOPHEN 10-325 MG PO TABS: 1.0000 | ORAL_TABLET | Freq: Three times a day (TID) | ORAL | 0 refills | Status: DC | PRN

## 2020-04-07 NOTE — Telephone Encounter (Signed)
Pt is requesting her oxyCODONE-acetaminophen (PERCOCET) 10-325 MG tablet be sent to  [facility] #[id number] - [place], [place] - [address] AT [facility] OF [address] Phone:  [phone]  Fax:  [phone]     ( pt  Has three pills left )

## 2020-04-07 NOTE — Telephone Encounter (Signed)
Last rx written 02/1420. Last OV  03/12/19. Next OV has not been scheduled. UDS  08/12/19.

## 2020-04-16 DIAGNOSIS — Z23 Encounter for immunization: Secondary | ICD-10-CM | POA: Diagnosis not present

## 2020-05-09 ENCOUNTER — Other Ambulatory Visit: Payer: Self-pay

## 2020-05-09 DIAGNOSIS — M2550 Pain in unspecified joint: Secondary | ICD-10-CM

## 2020-05-09 NOTE — Telephone Encounter (Signed)
Need refill on oxyCODONE-acetaminophen (PERCOCET) 10-325 MG tablet(Expired ;pt contact [phone]   [facility] #[id number] - [place], [place] - [address] AT [facility] OF [address]

## 2020-05-11 ENCOUNTER — Telehealth: Payer: Self-pay

## 2020-05-11 NOTE — Telephone Encounter (Signed)
Called checking on her medication refill that was done on 05/09/20

## 2020-05-12 MED ORDER — OXYCODONE-ACETAMINOPHEN 10-325 MG PO TABS: 1.0000 | ORAL_TABLET | Freq: Three times a day (TID) | ORAL | 0 refills | Status: DC | PRN

## 2020-05-13 ENCOUNTER — Telehealth: Payer: Self-pay

## 2020-05-13 NOTE — Telephone Encounter (Signed)
Pt requesting status of oxycodone refill Refill request approved yesterday-CMA confirmed rx was received by pharmacy and will get ready for patient.Vanessa Spittle Cassady3/18/202211:52 AM  Pt aware.

## 2020-05-13 NOTE — Telephone Encounter (Signed)
Pt is requesting a nurse to callback [phone]

## 2020-06-07 ENCOUNTER — Other Ambulatory Visit: Payer: Self-pay | Admitting: Internal Medicine

## 2020-06-07 DIAGNOSIS — I82403 Acute embolism and thrombosis of unspecified deep veins of lower extremity, bilateral: Secondary | ICD-10-CM

## 2020-06-07 DIAGNOSIS — M2550 Pain in unspecified joint: Secondary | ICD-10-CM

## 2020-06-09 MED ORDER — OXYCODONE-ACETAMINOPHEN 10-325 MG PO TABS: 1.0000 | ORAL_TABLET | Freq: Three times a day (TID) | ORAL | 0 refills | Status: DC | PRN

## 2020-06-09 NOTE — Telephone Encounter (Signed)
Need refill on oxyCODONE-acetaminophen (PERCOCET) 10-325 MG tablet ,pt contact [phone]  [facility] #[id number] - [place], [place] - [address] AT [facility] OF [address]

## 2020-06-09 NOTE — Telephone Encounter (Signed)
Last rx written 05/12/20. Last OV 03/11/20. Next OV 08/24/20. UDS 08/12/19.

## 2020-06-21 ENCOUNTER — Other Ambulatory Visit: Payer: Self-pay | Admitting: *Deleted

## 2020-06-21 NOTE — Patient Outreach (Signed)
[facility]) Care Management  06/21/2020  Vanessa Palmer Aug 12, 1950 [id number]  RN Health Coach attempted follow up outreach call to patient.  Patient was unavailable. HIPPA compliance voicemail message left with return callback number.  Plan: RN will call patient again within 30 days.  Gean Maidens BSN RN [facility] Care Management [phone]

## 2020-07-04 ENCOUNTER — Telehealth: Payer: Self-pay

## 2020-07-04 NOTE — Telephone Encounter (Signed)
[employer]  Is requesting a call back about a fax sent to the office 06/09/20  Contact person ... Jodi Mourning [phone] regarding her medical supplies

## 2020-07-05 NOTE — Telephone Encounter (Signed)
Dr. Criselda Peaches, the DME that [employer] is requesting is for mobility back brace, mobility knee LT, and Suspension Sleeve OTS. Is this something you have discussed with patient?

## 2020-07-06 ENCOUNTER — Other Ambulatory Visit: Payer: Self-pay

## 2020-07-06 DIAGNOSIS — M2550 Pain in unspecified joint: Secondary | ICD-10-CM

## 2020-07-06 NOTE — Telephone Encounter (Signed)
Form faxed back to [employer] with note that these items are not needed for patient per PCP.

## 2020-07-06 NOTE — Telephone Encounter (Signed)
oxyCODONE-acetaminophen (PERCOCET) 10-325 MG tablet, refill request @  [facility] #[id number] - [place], [place] - [address] AT [facility] OF [address] Phone:  [phone]  Fax:  [phone]      

## 2020-07-07 ENCOUNTER — Other Ambulatory Visit: Payer: Self-pay | Admitting: Internal Medicine

## 2020-07-07 NOTE — Telephone Encounter (Signed)
Next appt scheduled 6/29 with PCP. 

## 2020-07-08 MED ORDER — OXYCODONE-ACETAMINOPHEN 10-325 MG PO TABS: 1.0000 | ORAL_TABLET | Freq: Three times a day (TID) | ORAL | 0 refills | Status: DC | PRN

## 2020-07-13 ENCOUNTER — Other Ambulatory Visit: Payer: Self-pay | Admitting: *Deleted

## 2020-07-13 NOTE — Patient Outreach (Signed)
[facility]) Care Management  07/13/2020  Vanessa Palmer 12/14/1950 [id number]   RN Health Coach telephone call to patient.  Hipaa compliance verified. Per patient she was laying down. Her neck was hurting . Patient stated that she had not fallen or had any injuries. Patient requested a call back at a later time.   Gean Maidens BSN RN [facility] Care Management [phone]

## 2020-07-28 ENCOUNTER — Telehealth: Payer: Self-pay | Admitting: *Deleted

## 2020-07-28 NOTE — Chronic Care Management (AMB) (Signed)
  Care Management   Note  07/28/2020 Name: TYNIESHA HOWALD MRN: [id number] DOB: 1950/03/17  RALYN STLAURENT is a 70 y.o. year old female who is a primary care patient of Inez Catalina, MD. I reached out to [name] by phone today in response to a referral sent by Ms. Ashey D Longo'sMullen, Dillard Cannon, MD.    Ms. Talerico was given information about care management services today including:  1. Care management services include personalized support from designated clinical staff supervised by her physician, including individualized plan of care and coordination with other care providers 2. 24/7 contact phone numbers for assistance for urgent and routine care needs. 3. The patient may stop care management services at any time by phone call to the office staff.  Patient agreed to services and verbal consent obtained.   Follow up plan: Telephone appointment with care management team member scheduled for:08/16/2020  [facility] Guide, Embedded Care Coordination [facility] Management

## 2020-08-08 ENCOUNTER — Other Ambulatory Visit: Payer: Self-pay

## 2020-08-08 DIAGNOSIS — M2550 Pain in unspecified joint: Secondary | ICD-10-CM

## 2020-08-08 NOTE — Telephone Encounter (Signed)
Last office visit: 03/11/20  Last UDS: 08/12/2019 Last Written: #90 07/08/20 Next appt: 08/24/20

## 2020-08-08 NOTE — Telephone Encounter (Signed)
oxyCODONE-acetaminophen (PERCOCET) 10-325 MG tablet (Expired), refill request @  [facility] #[id number] - [place], [place] - [address] AT [facility] OF [address] Phone:  [phone]  Fax:  [phone]

## 2020-08-09 ENCOUNTER — Other Ambulatory Visit: Payer: Self-pay | Admitting: *Deleted

## 2020-08-09 NOTE — Patient Outreach (Signed)
[facility]) Care Management  08/09/2020  Vanessa Palmer Jun 20, 1950 [id number]   RN Health Coach attempted follow up outreach call to patient.  Patient was unavailable. HIPPA compliance voicemail message left with return callback number.  Plan: RN will call patient again within 30 days.  Gean Maidens BSN RN [facility] Care Management [phone]

## 2020-08-10 ENCOUNTER — Telehealth: Payer: Self-pay

## 2020-08-10 NOTE — Telephone Encounter (Signed)
RTC, patient asking if Dr. Criselda Peaches has sent in her RX for oxycodone. Patient informed that the request for Oxycodone was received and sent to MD yesterday and MD's have 48hours to respond.  Will resend request today.  Pt also states that she has lost her Xarelto medication bottle.  Per chart review, patient has a refill on Xarelto.  Pt instructed to call her insurance company and let them know she lost her RX medication bottle of Xarelto and ask them to approve an early refill.  She was then instructed to call her pharmacist.  RN informed patient of the importance of  continuing Xarelto as prescribed and told patient if she has any problems obtaining the refill to call [facility] back.  She verbalized understanding and states she will call the insurance company now.   SChaplin, RN,BSN

## 2020-08-10 NOTE — Telephone Encounter (Signed)
Requesting to speak with a nurse about meds. Please call pt back.  

## 2020-08-12 MED ORDER — OXYCODONE-ACETAMINOPHEN 10-325 MG PO TABS: 1.0000 | ORAL_TABLET | Freq: Three times a day (TID) | ORAL | 0 refills | Status: DC | PRN

## 2020-08-12 NOTE — Telephone Encounter (Signed)
Refill has not yet been approved. Forwarded request to PCP and Attending Pool.

## 2020-08-12 NOTE — Telephone Encounter (Signed)
Pt want to know if the doctor approved Rx for oxyCODONE-acetaminophen (PERCOCET) 10-325 MG tablet (Expired). Please call pt back.

## 2020-08-13 ENCOUNTER — Other Ambulatory Visit: Payer: Self-pay | Admitting: Internal Medicine

## 2020-08-15 ENCOUNTER — Other Ambulatory Visit: Payer: Self-pay

## 2020-08-15 ENCOUNTER — Telehealth: Payer: Self-pay

## 2020-08-15 ENCOUNTER — Ambulatory Visit (INDEPENDENT_AMBULATORY_CARE_PROVIDER_SITE_OTHER): Payer: Medicare Other | Admitting: Student

## 2020-08-15 ENCOUNTER — Encounter: Payer: Self-pay | Admitting: Student

## 2020-08-15 VITALS — BP 158/90 | HR 71 | Temp 98.2°F

## 2020-08-15 DIAGNOSIS — M1611 Unilateral primary osteoarthritis, right hip: Secondary | ICD-10-CM | POA: Diagnosis not present

## 2020-08-15 NOTE — Patient Instructions (Addendum)
Vanessa Palmer,   Thank you for your visit to the [facility] today. It was a pleasure meeting you. Today we discussed the following:  1) Acute on chronic right hip pain - We called and set you up for an appointment with Dr. Rayburn Ma this Wednesday at 9:45 am - Until then, start taking your oxycodone-acetaminophen 10-325 mg again - You can also apply heat to the area  Please schedule follow-up with your PCP, Dr. Criselda Peaches, in the next month for follow-up of your chronic medical conditions.   If you have any questions or concerns, please call our clinic at [phone] between 9am-5pm. Outside of these hours, call [phone] and ask for the internal medicine resident on call. If you feel you are having a medical emergency please call 911.

## 2020-08-15 NOTE — Telephone Encounter (Signed)
Returned call to patient. States she called [facility] yesterday and was told they had not received Rx for oxycodone. Explained Rx was sent on 6/17 and we received confirmed receipt by pharmacy. Call placed to Tangela at [facility]. States she has the Rx and will get it ready now. Patient notified and is very Adult nurse.

## 2020-08-15 NOTE — Telephone Encounter (Signed)
Spoke with Dr. Criselda Peaches. She would like patient seen today for possible hip x-ray to see if there have been any changes to hardware from hip replacement. Placed on Dr. Aura Dials schedule today at 3:45.
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
## 2020-08-15 NOTE — Telephone Encounter (Signed)
RTC, VM obtained, message left nurse was returning her call. SChaplin, RN,BSN  

## 2020-08-15 NOTE — Telephone Encounter (Signed)
Patient called back crying, stating pharmacy still has not filled her pain med. Explained Tangela from [employer] stated she was getting it ready this AM. Asked if this was her usual pain and patient stated, "no." States the pain she is having is new pain at site of right hip replacement. Denies fever, chills. She will have her niece go to pharmacy and wait till med is ready.

## 2020-08-15 NOTE — Telephone Encounter (Signed)
Requesting to speak with a nurse, please call pt back.  

## 2020-08-16 ENCOUNTER — Telehealth: Payer: 59 | Admitting: Licensed Clinical Social Worker

## 2020-08-16 ENCOUNTER — Encounter: Payer: Self-pay | Admitting: Licensed Clinical Social Worker

## 2020-08-16 ENCOUNTER — Telehealth: Payer: Self-pay | Admitting: Licensed Clinical Social Worker

## 2020-08-16 NOTE — Telephone Encounter (Signed)
  Care Management   Follow Up Note   08/16/2020 Name: RHIANNAN KIEVIT MRN: [id number] DOB: [birth year]   Referred by: Inez Catalina, MD Reason for referral : No chief complaint on file.   An unsuccessful telephone outreach was attempted today. The patient was referred to the case management team for assistance with care management and care coordination.   Follow Up Plan: Telephone follow up appointment with care management team member scheduled JJO:ACZY 30-days  Christen Butter, [place]  Social Worker [facility] Care Management  [phone]

## 2020-08-16 NOTE — Progress Notes (Signed)
This encounter was created in error - please disregard.

## 2020-08-17 ENCOUNTER — Ambulatory Visit (INDEPENDENT_AMBULATORY_CARE_PROVIDER_SITE_OTHER): Payer: Medicare Other | Admitting: Orthopaedic Surgery

## 2020-08-17 ENCOUNTER — Ambulatory Visit: Payer: Self-pay

## 2020-08-17 ENCOUNTER — Other Ambulatory Visit: Payer: Self-pay

## 2020-08-17 ENCOUNTER — Encounter: Payer: Self-pay | Admitting: Orthopaedic Surgery

## 2020-08-17 VITALS — Ht 62.0 in | Wt 276.0 lb

## 2020-08-17 DIAGNOSIS — M25551 Pain in right hip: Secondary | ICD-10-CM

## 2020-08-17 MED ORDER — PREDNISONE 50 MG PO TABS: ORAL_TABLET | ORAL | 0 refills | Status: DC

## 2020-08-17 MED ORDER — TIZANIDINE HCL 4 MG PO TABS: 4.0000 mg | ORAL_TABLET | Freq: Three times a day (TID) | ORAL | 0 refills | Status: DC | PRN
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
## 2020-08-24 ENCOUNTER — Ambulatory Visit (INDEPENDENT_AMBULATORY_CARE_PROVIDER_SITE_OTHER): Payer: Medicare Other | Admitting: Internal Medicine

## 2020-08-24 ENCOUNTER — Encounter: Payer: Self-pay | Admitting: Internal Medicine

## 2020-08-24 ENCOUNTER — Other Ambulatory Visit: Payer: Self-pay

## 2020-08-24 VITALS — BP 145/89 | HR 64 | Temp 98.6°F | Ht 62.0 in | Wt 282.9 lb

## 2020-08-24 DIAGNOSIS — L309 Dermatitis, unspecified: Secondary | ICD-10-CM

## 2020-08-24 DIAGNOSIS — R32 Unspecified urinary incontinence: Secondary | ICD-10-CM

## 2020-08-24 DIAGNOSIS — S81801A Unspecified open wound, right lower leg, initial encounter: Secondary | ICD-10-CM | POA: Diagnosis not present

## 2020-08-24 DIAGNOSIS — R600 Localized edema: Secondary | ICD-10-CM

## 2020-08-24 DIAGNOSIS — Z96641 Presence of right artificial hip joint: Secondary | ICD-10-CM

## 2020-08-24 DIAGNOSIS — E785 Hyperlipidemia, unspecified: Secondary | ICD-10-CM | POA: Diagnosis not present

## 2020-08-24 DIAGNOSIS — M1A9XX0 Chronic gout, unspecified, without tophus (tophi): Secondary | ICD-10-CM | POA: Diagnosis not present

## 2020-08-24 DIAGNOSIS — J4520 Mild intermittent asthma, uncomplicated: Secondary | ICD-10-CM

## 2020-08-24 DIAGNOSIS — F331 Major depressive disorder, recurrent, moderate: Secondary | ICD-10-CM

## 2020-08-24 DIAGNOSIS — Z79891 Long term (current) use of opiate analgesic: Secondary | ICD-10-CM | POA: Diagnosis not present

## 2020-08-24 DIAGNOSIS — N1831 Chronic kidney disease, stage 3a: Secondary | ICD-10-CM | POA: Diagnosis not present

## 2020-08-24 DIAGNOSIS — E559 Vitamin D deficiency, unspecified: Secondary | ICD-10-CM

## 2020-08-24 DIAGNOSIS — M1611 Unilateral primary osteoarthritis, right hip: Secondary | ICD-10-CM

## 2020-08-24 DIAGNOSIS — I10 Essential (primary) hypertension: Secondary | ICD-10-CM

## 2020-08-24 DIAGNOSIS — I82403 Acute embolism and thrombosis of unspecified deep veins of lower extremity, bilateral: Secondary | ICD-10-CM | POA: Diagnosis not present

## 2020-08-24 DIAGNOSIS — K219 Gastro-esophageal reflux disease without esophagitis: Secondary | ICD-10-CM | POA: Diagnosis not present

## 2020-08-24 DIAGNOSIS — I2510 Atherosclerotic heart disease of native coronary artery without angina pectoris: Secondary | ICD-10-CM

## 2020-08-24 MED ORDER — AMLODIPINE BESYLATE 5 MG PO TABS: 5.0000 mg | ORAL_TABLET | Freq: Every day | ORAL | 3 refills | Status: DC

## 2020-08-24 MED ORDER — LOSARTAN POTASSIUM 25 MG PO TABS: ORAL_TABLET | ORAL | 3 refills | Status: DC

## 2020-08-24 MED ORDER — DOXYCYCLINE HYCLATE 100 MG PO TABS: 100.0000 mg | ORAL_TABLET | Freq: Two times a day (BID) | ORAL | 0 refills | Status: AC

## 2020-08-24 MED ORDER — SERTRALINE HCL 50 MG PO TABS: 50.0000 mg | ORAL_TABLET | Freq: Every day | ORAL | 1 refills | Status: DC

## 2020-08-24 MED ORDER — FUROSEMIDE 40 MG PO TABS: 40.0000 mg | ORAL_TABLET | Freq: Every day | ORAL | 3 refills | Status: DC

## 2020-08-24 MED ORDER — OMEPRAZOLE 20 MG PO CPDR: 20.0000 mg | DELAYED_RELEASE_CAPSULE | Freq: Every day | ORAL | 3 refills | Status: DC

## 2020-08-24 MED ORDER — ATORVASTATIN CALCIUM 40 MG PO TABS: 40.0000 mg | ORAL_TABLET | Freq: Every day | ORAL | 3 refills | Status: DC

## 2020-08-24 MED ORDER — METOPROLOL SUCCINATE ER 50 MG PO TB24: 50.0000 mg | ORAL_TABLET | Freq: Every day | ORAL | 3 refills | Status: DC

## 2020-08-24 MED ORDER — TOLTERODINE TARTRATE ER 2 MG PO CP24: 2.0000 mg | ORAL_CAPSULE | Freq: Every day | ORAL | 3 refills | Status: DC

## 2020-08-24 MED ORDER — TRIAMCINOLONE ACETONIDE 0.1 % EX CREA: 1.0000 "application " | TOPICAL_CREAM | Freq: Two times a day (BID) | CUTANEOUS | 0 refills | Status: DC

## 2020-08-24 MED ORDER — HYDRALAZINE HCL 50 MG PO TABS: 50.0000 mg | ORAL_TABLET | Freq: Two times a day (BID) | ORAL | 3 refills | Status: DC

## 2020-08-24 NOTE — Assessment & Plan Note (Signed)
Last Vitamin D was low normal.  Recheck today.

## 2020-08-24 NOTE — Assessment & Plan Note (Signed)
BP today was a little high.  I do not think she had yet taken her blood pressure medications.  She has been controlled on current regimen in the past, but commonly does not have good control in the clinic.  I will plan to refer her to our pharmacist for help with medication reconciliation and possibly getting good blood pressure measurement at home to see if she is controlled at home.  She has CKD and so I think getting more consistent control will be important for renal protection.   Plan Clinical pharmacy referral Continue current therapy of metoprolol daily, amlodipine 5 daily, hydralazine 50 BID and losartan 25 Qdaily.  She is also on chronic lasix for LE venous insufficiency.

## 2020-08-24 NOTE — Assessment & Plan Note (Signed)
No complaints today beyond constipation related to her oxybutynin.  She is taking her omeprazole as prescribed and notes no issues with heartburn.   Plan Continue omeprazole.

## 2020-08-24 NOTE — Progress Notes (Signed)
Subjective:    Patient ID: Vanessa Palmer, female    DOB: May 03, 1950, 70 y.o.   MRN: [id number]  CC: 6 month follow up for HTN and other chronic medical issues  HPI  Vanessa Palmer is a 70 year old woman with PMH of OA, hip pain, DVT on chronic xarelto, depression/anxiety, CAD, chronic angina, OSA who presents for follow up.  She was last seen in clinic for severe hip pain.  She went to see her orthopedic surgeon who recommended an MRI and put her on a steroid course for the pain.  MRI is pending at this time.   Vanessa Palmer report that her hip pain is improved with tizanidine and steroid course.  She has not had her MRI scheduled yet.   She has 2 new complaints today.  She has an itchy dermatitis on her legs on the front and she has a draining lesion on the right back leg.  She feels like this is pus.  No increased swelling, no worsening of warmth or erythema.  She has chronic venous insufficiency related to DVTs in the past and chronic swelling.    She further notes that with the pain improved, her mood is pretty good. As concerns her urinary incontinence, she has been on oxybutynin, but feels it causes too much constipation.  She is amenable to trying a new medication today.    Review of Systems  Constitutional:  Positive for activity change. Negative for appetite change and unexpected weight change.  Respiratory:  Negative for cough, choking and chest tightness.   Cardiovascular:  Positive for leg swelling. Negative for chest pain and palpitations.  Gastrointestinal:  Positive for constipation. Negative for diarrhea, nausea and vomiting.  Genitourinary:  Positive for enuresis and frequency. Negative for difficulty urinating and dysuria.  Musculoskeletal:  Positive for arthralgias and gait problem. Negative for back pain.  Skin:  Positive for color change, pallor, rash and wound.  Neurological:  Negative for dizziness and weakness.  Psychiatric/Behavioral:  Negative for decreased  concentration and dysphoric mood.       Objective:   Physical Exam Constitutional:      General: She is not in acute distress.    Appearance: Normal appearance. She is obese. She is not toxic-appearing.  HENT:     Head: Normocephalic and atraumatic.  Cardiovascular:     Rate and Rhythm: Normal rate and regular rhythm.     Heart sounds: Murmur (2/6, lusb, systolic) heard.  Pulmonary:     Effort: Pulmonary effort is normal. No respiratory distress.     Breath sounds: Normal breath sounds. No wheezing.  Abdominal:     General: Abdomen is flat. There is no distension.     Palpations: Abdomen is soft.     Tenderness: There is no abdominal tenderness.  Musculoskeletal:        General: Swelling and tenderness present.     Right lower leg: Edema present.     Left lower leg: Edema present.  Skin:    General: Skin is warm and dry.     Comments: She has pale discoloration to the anterior calves bilaterally, this is itchy, no raised areas. She further has a small open area on the back of the right calf near the ankle.  This is draining some clear fluid.  She notes that it has been draining yellow fluid and is painful.  No warmth or erythema surrounding.   Neurological:     Mental Status: She is alert and oriented  to person, place, and time. Mental status is at baseline.  Psychiatric:        Mood and Affect: Mood normal.        Behavior: Behavior normal.     Uric Acid, CMET, CBC, vitamin D today     Assessment & Plan:  RTC in 3 months, sooner if needed.

## 2020-08-24 NOTE — Assessment & Plan Note (Signed)
She notes no attacks.  She is not on a controlling medication.  She notes that she was told to stop eating shellfish and drinking ETOH and since that time has had no further gouty attacks.   Plan Check uric acid.

## 2020-08-24 NOTE — Assessment & Plan Note (Signed)
She notes constipation with her oxybutynin.  We discussed only taking it on days when she needs to be outside of the house and away from a toilet.  She would like to try a different medication.   Plan STOP oxybutynin Start tolteradine 2mg  daily

## 2020-08-24 NOTE — Assessment & Plan Note (Signed)
She has chronic angina and CAD.  She notes no chest pain and not requiring her nitroglycerin lately.  She is on xarelto and we are working to get her BP better controlled.   Continue to monitor

## 2020-08-24 NOTE — Assessment & Plan Note (Signed)
She is taking xarelto without issue.  No bleeding issues today.  She has chronic venous insufficiency related to her DVTs in the past, and this has unfortunately led to some chronic dermatitis and intermittent swelling and drainage.  She is very concerned about an infection today.  I think she is at risk given her medical co-morbidities and chronic swelling.  Will plan to continue her xarelto.  Will also give her a short course of doxycycline to see if this helps with the drainage and to hopefully prevent her from developing a deep infection.  Advised to avoid the sun.

## 2020-08-24 NOTE — Assessment & Plan Note (Signed)
This is a persistent problem for her.  She has now developed a dermatitis related to chronic inflammation and swelling.  She also has a small area of drainage on the back of the right leg.   Plan Doxycycline 100mg  BID X 5 days Kenalog to areas of dermatitis for itching.

## 2020-08-24 NOTE — Assessment & Plan Note (Signed)
She currently has good control of symptoms with inhaled flovent along and flonase for allergy triggers.  No wheezing on exam today.   Plan Continue INH flovent.

## 2020-08-24 NOTE — Patient Instructions (Signed)
Vanessa Palmer - -  For your urinary incontinence: Please STOP oxybutynin and try Tolteradine 22mdaily.  You can take this on day you need better control of your symptoms.  On days you are staying home, you do not have to take it.  For constipation related, please take miralax and daily fiber.   For your leg wound which is draining, please take DOXYCYCLINE 1059mtwice a day for 5 days.   For your dermatitis (change to the skin) on the front of your legs, please try Kenalog cream and let me know if this helps.   I have refilled your other medications.   Please come back to see me in 3 months, sooner if you need!  Doxycycline Capsules or Tablets What is this medication? DOXYCYCLINE (dox i SYE kleen) treats infections caused by bacteria. It belongs to a group of medications called tetracycline antibiotics. It will not treatcolds, the flu, or infections caused by viruses. This medicine may be used for other purposes; ask your health care provider orpharmacist if you have questions. COMMON BRAND NAME(S): Acticlate, Adoxa, Adoxa CK, Adoxa Pak, Adoxa TT, Alodox, Avidoxy, Doxal, LYMEPAK, Mondoxyne NL, Monodox, Morgidox 1x, Morgidox 1x Kit, Morgidox 2x, Morgidox 2x Kit, NutriDox, Ocudox, OkPeerlessPeriostat, TARGADOX,Vibra-Tabs, Vibramycin What should I tell my care team before I take this medication? They need to know if you have any of these conditions: Kidney disease Liver disease Long exposure to sunlight like working outdoors Recent stomach surgery Stomach or intestine problems such as colitis Vision Problems Yeast or fungal infection of the mouth or vagina An unusual or allergic reaction to doxycycline, tetracycline antibiotics, other medications, foods, dyes, or preservatives Pregnant or trying to get pregnant Breast-feeding How should I use this medication? Take this medication by mouth with water. Take it as directed on the prescription label at the same time every day. It is best to take  this medication without food, but if it upsets your stomach take it with food. Take all of this medication unless your care team tells you to stop it early. Keeptaking it even if you think you are better. Take antacids and products with aluminum, calcium, magnesium, iron, and zinc in them at a different time of day than this medication. Talk to your care team ifyou have questions. Talk to your care team regarding the use of this medication in children. Whilethis medication may be prescribed for selected conditions, precautions do apply. Overdosage: If you think you have taken too much of this medicine contact apoison control center or emergency room at once. NOTE: This medicine is only for you. Do not share this medicine with others. What if I miss a dose? If you miss a dose, take it as soon as you can. If it is almost time for yournext dose, take only that dose. Do not take double or extra doses. What may interact with this medication? Antacids, vitamins, or other products that contain aluminum, calcium, iron, magnesium, or zinc Barbiturates Birth control pills Bismuth subsalicylate Carbamazepine Methoxyflurane Oral retinoids such as acitretin, isotretinoin Other antibiotics Phenytoin Warfarin This list may not describe all possible interactions. Give your health care provider a list of all the medicines, herbs, non-prescription drugs, or dietary supplements you use. Also tell them if you smoke, drink alcohol, or use illegaldrugs. Some items may interact with your medicine. What should I watch for while using this medication? Tell your care team if your symptoms do not improve. Do not treat diarrhea with over the counter products. Contact  your care team ifyou have diarrhea that lasts more than 2 days or if it is severe and watery. Do not take this medication just before going to bed. It may not dissolve properly when you lay down and can cause pain in your throat. Drink plenty of fluids while  taking this medication to also help reduce irritation in yourthroat. This medication can make you more sensitive to the sun. Keep out of the sun. If you cannot avoid being in the sun, wear protective clothing and use sunscreen.Do not use sun lamps or tanning beds/booths. Birth control pills may not work properly while you are taking this medication.Talk to your care team about using an extra method of birth control. If you are being treated for a sexually transmitted infection, avoid sexual contact until you have finished your treatment. Your sexual partner may alsoneed treatment. If you are using this medication to prevent malaria, you should still protect yourself from contact with mosquitos. Stay in screened-in areas, use mosquitonets, keep your body covered, and use an insect repellent. What side effects may I notice from receiving this medication? Side effects that you should report to your care team as soon as possible: Allergic reactions-skin rash, itching, hives, swelling of the face, lips, tongue, or throat Increased pressure around the brain-severe headache, change in vision, blurry vision, nausea, vomiting Joint pain Pain or trouble swallowing Redness, blistering, peeling, or loosening of the skin, including inside the mouth Severe diarrhea, fever Unusual vaginal discharge, itching, or odor Side effects that usually do not require medical attention (report these toyour care team if they continue or are bothersome): Change in tooth color Diarrhea Headache Heartburn Nausea This list may not describe all possible side effects. Call your doctor for medical advice about side effects. You may report side effects to FDA at1-800-FDA-1088. Where should I keep my medication? Keep out of the reach of children and pets. Store at room temperature, below 30 degrees C (86 degrees F). Protect from light. Keep container tightly closed. Throw away any unused medication after the expiration date.  Taking this medication after the expiration date can makeyou seriously ill. NOTE: This sheet is a summary. It may not cover all possible information. If you have questions about this medicine, talk to your doctor, pharmacist, orhealth care provider.  2022 Elsevier/Gold Standard (2020-04-14 14:12:28)

## 2020-08-24 NOTE — Assessment & Plan Note (Addendum)
She notes improvement in her symptoms on steroids and tizanidine.  She has not gotten her MRI scheduled yet, so I advised she call Dr. Doroteo Glassman office to get more information.  She has moderately controlled pain at this time.  She does take oxycodone-apap and has been on a stable dose prescribed by me for many years.  She has no red or yellow flags.  Last UDS was 1 year ago.  PDMP has been appropriate for refills.   Plan Continue current therapy - Follow up with orthopedics [facility] today.

## 2020-08-24 NOTE — Assessment & Plan Note (Signed)
>>  ASSESSMENT AND PLAN FOR CORONARY ATHEROSCLEROSIS WRITTEN ON 08/24/2020 10:22 AM BY Jahquez Steffler B, MD  She has chronic angina and CAD.  She notes no chest pain and not requiring her nitroglycerin  lately.  She is on xarelto  and we are working to get her BP better controlled.   Continue to monitor

## 2020-08-24 NOTE — Assessment & Plan Note (Signed)
Renal function has been stable over the last few years.  She notes no change in urinary habits, no hematuria.  She has uncontrolled HTN and we will get her to see our clinical pharmacist for help in this arena.  We will check MAU/Cr today for protein and also renal function today.   Plan Better BP control needed, pharmacy referral for assistance Follow up renal function

## 2020-08-24 NOTE — Assessment & Plan Note (Signed)
She notes that her mood is relatively well controlled, but she continues to have issues with insomnia.  She is taking her sertraline without issue.  We did not have a significant amount of time to discuss this issue, so we will address again at next visit.   Continue sertraline.

## 2020-08-25 ENCOUNTER — Other Ambulatory Visit: Payer: Self-pay | Admitting: Orthopaedic Surgery

## 2020-08-25 ENCOUNTER — Telehealth: Payer: Self-pay | Admitting: Orthopaedic Surgery

## 2020-08-25 LAB — CMP14 + ANION GAP
ALT: 10 IU/L (ref 0–32)
AST: 15 IU/L (ref 0–40)
Albumin/Globulin Ratio: 1.1 — ABNORMAL LOW (ref 1.2–2.2)
Albumin: 3.9 g/dL (ref 3.8–4.8)
Alkaline Phosphatase: 141 IU/L — ABNORMAL HIGH (ref 44–121)
Anion Gap: 17 mmol/L (ref 10.0–18.0)
BUN/Creatinine Ratio: 17 (ref 12–28)
BUN: 26 mg/dL (ref 8–27)
Bilirubin Total: 0.6 mg/dL (ref 0.0–1.2)
CO2: 23 mmol/L (ref 20–29)
Calcium: 8 mg/dL — ABNORMAL LOW (ref 8.7–10.3)
Chloride: 97 mmol/L (ref 96–106)
Creatinine, Ser: 1.56 mg/dL — ABNORMAL HIGH (ref 0.57–1.00)
Globulin, Total: 3.4 g/dL (ref 1.5–4.5)
Glucose: 103 mg/dL — ABNORMAL HIGH (ref 65–99)
Potassium: 3.7 mmol/L (ref 3.5–5.2)
Sodium: 137 mmol/L (ref 134–144)
Total Protein: 7.3 g/dL (ref 6.0–8.5)
eGFR: 36 mL/min/{1.73_m2} — ABNORMAL LOW (ref >59)

## 2020-08-25 LAB — MICROALBUMIN / CREATININE URINE RATIO
Creatinine, Urine: 68.1 mg/dL
Microalb/Creat Ratio: 123 mg/g creat — ABNORMAL HIGH (ref 0–29)
Microalbumin, Urine: 83.8 ug/mL

## 2020-08-25 LAB — CBC
Hematocrit: 37.4 % (ref 34.0–46.6)
Hemoglobin: 12.6 g/dL (ref 11.1–15.9)
MCH: 31.7 pg (ref 26.6–33.0)
MCHC: 33.7 g/dL (ref 31.5–35.7)
MCV: 94 fL (ref 79–97)
Platelets: 194 10*3/uL (ref 150–450)
RBC: 3.97 x10E6/uL (ref 3.77–5.28)
RDW: 12.7 % (ref 11.7–15.4)
WBC: 5.5 10*3/uL (ref 3.4–10.8)

## 2020-08-25 LAB — VITAMIN D 25 HYDROXY (VIT D DEFICIENCY, FRACTURES): Vit D, 25-Hydroxy: 26.8 ng/mL — ABNORMAL LOW (ref 30.0–100.0)

## 2020-08-25 LAB — URIC ACID: Uric Acid: 9 mg/dL — ABNORMAL HIGH (ref 3.0–7.2)

## 2020-08-25 MED ORDER — DIAZEPAM 5 MG PO TABS: 5.0000 mg | ORAL_TABLET | Freq: Once | ORAL | 0 refills | Status: AC

## 2020-08-25 NOTE — Telephone Encounter (Signed)
Called pt and informed

## 2020-08-25 NOTE — Telephone Encounter (Signed)
Patient called asked if she can get something called into her pharmacy prior to the MRI? Patient said she is claustrophobic. Patient uses [facility] on [address]  The number to contact patient is .[phone]

## 2020-08-25 NOTE — Telephone Encounter (Signed)
Please advise 

## 2020-08-26 ENCOUNTER — Ambulatory Visit: Payer: Medicare Other | Admitting: Licensed Clinical Social Worker

## 2020-08-26 NOTE — Chronic Care Management (AMB) (Signed)
  Care Management   Social Work Visit Note  08/26/2020 Name: Vanessa Palmer MRN: [id number] DOB: 1950-05-30  Vanessa Palmer is a 70 y.o. year old female who sees Inez Catalina, MD for primary care. The care management team was consulted for assistance with care management and care coordination needs related to  Initial visit.    Patient was given the following information about care management and care coordination services today, agreed to services, and gave verbal consent: 1.care management/care coordination services include personalized support from designated clinical staff supervised by their physician, including individualized plan of care and coordination with other care providers 2. 24/7 contact phone numbers for assistance for urgent and routine care needs. 3. The patient may stop care management/care coordination services at any time by phone call to the office staff.  Engaged with patient by telephone for initial visit in response to provider referral for social work chronic care management and care coordination services.  Assessment: Review of patient history, allergies, and health status during evaluation of patient need for care management/care coordination services.    Interventions:  Patient interviewed and appropriate assessments performed Collaborated with clinical team regarding patient needs  SW completed SDOH screening and unable to determine any needs. Patient declines services or needing any interventions. Patient stated SW services were not needed at this time, but patient agreed to reach out if services are needed in the future.  SW discussed family support and patient stated she has a support system.  BSW removed herself as part of the CCM team.   SDOH (Social Determinants of Health) assessments performed: Yes     Plan:  No additional follow up needed.  Christen Butter, BSW  Social Worker [facility] Care Management  [phone]

## 2020-08-26 NOTE — Patient Instructions (Signed)
Visit Information  Instructions:   Patient was given the following information about care management and care coordination services today, agreed to services, and gave verbal consent: 1.care management/care coordination services include personalized support from designated clinical staff supervised by their physician, including individualized plan of care and coordination with other care providers 2. 24/7 contact phone numbers for assistance for urgent and routine care needs. 3. The patient may stop care management/care coordination services at any time by phone call to the office staff.  Patient verbalizes understanding of instructions provided today and agrees to view in MyChart.   No further follow up required: Patient declines services at this time.  Christen Butter, BSW  Social Worker [facility] Care Management  [phone]

## 2020-08-30 ENCOUNTER — Other Ambulatory Visit: Payer: Self-pay | Admitting: *Deleted

## 2020-08-30 NOTE — Progress Notes (Signed)
Internal Medicine Clinic Attending  Case discussed with Dr. Watson  At the time of the visit.  We reviewed the resident's history and exam and pertinent patient test results.  I agree with the assessment, diagnosis, and plan of care documented in the resident's note.  

## 2020-08-30 NOTE — Patient Outreach (Signed)
[facility]) Care Management  08/30/2020  Vanessa Palmer 1950-09-13 [id number]   RN Health Coach attempted follow up outreach call to patient.  Patient stated she was on her way to do something and wanted a call back. Plan: RN will call patient again within 30 days.  Gean Maidens BSN RN [facility] Care Management [phone]

## 2020-09-03 ENCOUNTER — Ambulatory Visit
Admission: RE | Admit: 2020-09-03 | Discharge: 2020-09-03 | Disposition: A | Discharge status: Home / Self Care | Payer: Medicare Other | Source: Ambulatory Visit | Attending: Orthopaedic Surgery | Admitting: Orthopaedic Surgery

## 2020-09-03 ENCOUNTER — Other Ambulatory Visit: Payer: Self-pay | Admitting: Orthopaedic Surgery

## 2020-09-03 ENCOUNTER — Other Ambulatory Visit: Payer: Self-pay

## 2020-09-03 DIAGNOSIS — M25551 Pain in right hip: Secondary | ICD-10-CM

## 2020-09-03 LAB — TOXASSURE SELECT,+ANTIDEPR,UR

## 2020-09-07 ENCOUNTER — Ambulatory Visit: Payer: Medicare Other | Admitting: Orthopaedic Surgery

## 2020-09-09 ENCOUNTER — Other Ambulatory Visit: Payer: Self-pay

## 2020-09-09 DIAGNOSIS — M2550 Pain in unspecified joint: Secondary | ICD-10-CM

## 2020-09-09 MED ORDER — OXYCODONE-ACETAMINOPHEN 10-325 MG PO TABS: 1.0000 | ORAL_TABLET | Freq: Three times a day (TID) | ORAL | 0 refills | Status: DC | PRN

## 2020-09-09 NOTE — Telephone Encounter (Signed)
oxyCODONE-acetaminophen (PERCOCET) 10-325 MG tablet, REFILL REQUEST @ [id number] - [place], [place] - [address] AT [address] 

## 2020-09-13 ENCOUNTER — Other Ambulatory Visit: Payer: Self-pay

## 2020-09-13 ENCOUNTER — Ambulatory Visit
Admission: RE | Admit: 2020-09-13 | Discharge: 2020-09-13 | Disposition: A | Discharge status: Home / Self Care | Payer: Medicare Other | Source: Ambulatory Visit | Attending: Internal Medicine | Admitting: Internal Medicine

## 2020-09-13 DIAGNOSIS — G4733 Obstructive sleep apnea (adult) (pediatric): Secondary | ICD-10-CM | POA: Diagnosis not present

## 2020-09-13 DIAGNOSIS — M2550 Pain in unspecified joint: Secondary | ICD-10-CM | POA: Diagnosis not present

## 2020-09-13 DIAGNOSIS — Z1231 Encounter for screening mammogram for malignant neoplasm of breast: Secondary | ICD-10-CM

## 2020-09-13 DIAGNOSIS — M1990 Unspecified osteoarthritis, unspecified site: Secondary | ICD-10-CM | POA: Diagnosis not present

## 2020-09-14 ENCOUNTER — Other Ambulatory Visit: Payer: Self-pay | Admitting: *Deleted

## 2020-09-15 ENCOUNTER — Encounter: Payer: Medicare Other | Admitting: Pharmacist

## 2020-09-15 NOTE — Patient Outreach (Signed)
[facility]) Care Management  [facility] Care Manager  09/15/2020   Vanessa Palmer March 09, 1950 [id number]  RN Health Coach telephone call to patient.  [employer] compliance verified. Per patient her blood pressure today is 132/86 HR 71. Her weight is 272 lbs. Patient stated that her Caregiver [name] checks her blood pressure sometimes for her. She has some pain in her legs. Patient has not received her 2nd COVID booster. She has not been doing her chair exercises. Per patient she has been getting out to socialize at church. Patient has agreed to follow up outreach calls.  Encounter Medications:  Outpatient Encounter Medications as of 09/14/2020  Medication Sig   amLODipine (NORVASC) 5 MG tablet Take 1 tablet (5 mg total) by mouth daily.   atorvastatin (LIPITOR) 40 MG tablet Take 1 tablet (40 mg total) by mouth daily.   EPINEPHrine 0.3 mg/0.3 mL IJ SOAJ injection Inject 0.3 mg into the muscle once as needed for anaphylaxis.   fluticasone (FLONASE) 50 MCG/ACT nasal spray Place 2 sprays into both nostrils daily as needed for allergies.   fluticasone (FLOVENT HFA) 44 MCG/ACT inhaler Inhale 2 puffs into the lungs 2 (two) times daily.   furosemide (LASIX) 40 MG tablet Take 1 tablet (40 mg total) by mouth daily.   hydrALAZINE (APRESOLINE) 50 MG tablet Take 1 tablet (50 mg total) by mouth 2 (two) times daily.   losartan (COZAAR) 25 MG tablet TAKE 1 TABLET(25 MG) BY MOUTH DAILY   metoprolol succinate (TOPROL-XL) 50 MG 24 hr tablet Take 1 tablet (50 mg total) by mouth daily.   nitroGLYCERIN (NITROSTAT) 0.4 MG SL tablet Place 1 tablet (0.4 mg total) under the tongue every 5 (five) minutes as needed for chest pain.   omeprazole (PRILOSEC) 20 MG capsule Take 1 capsule (20 mg total) by mouth daily.   oxyCODONE-acetaminophen (PERCOCET) 10-325 MG tablet Take 1 tablet by mouth every 8 (eight) hours as needed for pain (chronic arthritis pain, multiple sites, diag code M16.11, M25.50).    sertraline (ZOLOFT) 50 MG tablet Take 1 tablet (50 mg total) by mouth at bedtime.   tiZANidine (ZANAFLEX) 4 MG tablet TAKE 1 TABLET(4 MG) BY MOUTH EVERY 8 HOURS AS NEEDED FOR MUSCLE SPASMS   tolterodine (DETROL LA) 2 MG 24 hr capsule Take 1 capsule (2 mg total) by mouth daily.   triamcinolone cream (KENALOG) 0.1 % Apply 1 application topically 2 (two) times daily. To front of legs.   XARELTO 20 MG TABS tablet TAKE ONE TABLET BY MOUTH DAILY WITH SUPPER   No facility-administered encounter medications on file as of 09/14/2020.    Functional Status:  In your present state of health, do you have any difficulty performing the following activities: 08/24/2020 08/15/2020  Hearing? N Y  Comment - AT TIMES  Vision? N N  Difficulty concentrating or making decisions? Y Y  Comment - AT TIMES  Walking or climbing stairs? Y Y  Dressing or bathing? Y Y  Doing errands, shopping? Y Y  Comment - AT TIMES  Some recent data might be hidden    Fall/Depression Screening: Fall Risk  09/14/2020 08/24/2020 08/15/2020  Falls in the past year? 1 1 1   Number falls in past yr: 1 1 1   Comment - - -  Injury with Fall? 0 0 0  Risk Factor Category  - - -  Comment - - -  Risk for fall due to : Impaired balance/gait;Impaired mobility Impaired balance/gait;Impaired mobility Impaired balance/gait  Risk for fall due  to: Comment - - BALANCE DUE TO HIP  Follow up Falls evaluation completed Falls prevention discussed -   PHQ 2/9 Scores 08/24/2020 08/15/2020 03/11/2020 02/01/2020 12/11/2019 08/12/2019 07/23/2019  PHQ - 2 Score - 1 - 3 4 5 3   PHQ- 9 Score - 7 - 14 13 18 15   Exception Documentation Patient refusal - Patient refusal - - - -    Assessment:   Care Plan Care Plan : Hypertension (Adult)  Updates made by , RN since 09/15/2020 12:00 AM     Problem: Hypertension (Hypertension)   Priority: High  Onset Date: 03/23/2020     Goal: Hypertension Monitored   Start Date: 01/01/2020  Expected End  Date: 02/24/2021  This Visit's Progress: On track  Recent Progress: On track  Priority: High  Note:   Evidence-based guidance:  Promote initial use of ambulatory blood pressure measurements (for 3 days) to rule out "white-coat" effect; identify masked hypertension and presence or absence of nocturnal "dipping" of blood pressure.   Encourage continued use of home blood pressure monitoring and recording in blood pressure log; include symptoms of hypotension or potential medication side effects in log.  Review blood pressure measurements taken inside and outside of the provider office; establish baseline and monitor trends; compare to target ranges or patient goal.  Share overall cardiovascular risk with patient; encourage changes to lifestyle risk factors, including alcohol consumption, smoking, inadequate exercise, poor dietary habits and stress.   Notes:  13/06/2019 BP today 132/86      Goals Addressed             This Visit's Progress    ([facility])Eat Healthy   On track    Timeframe:  Long-Range Goal Priority:  Medium Start Date:     02/26/2021                        Expected End Date:  [id number]                    Follow Up Date [id number]   - set goal weight - drink 6 to 8 glasses of water each day - manage portion size - set a realistic goal    Why is this important?   When you are ready to manage your nutrition or weight, having a plan and setting goals will help.  Taking small steps to change how you eat and exercise is a good place to start.    Notes:  RN discussed frequent small feedings to help increase her metabolism Discussed low sodium diet [id number] Per patient  weighed  today at visit. 272 lbs BMI 49     ([facility])Make and Keep All Appointments   On track    Timeframe:  Long-Range Goal Priority:  Medium Start Date:   75643329                          Expected End Date:   51884166                   Follow Up Date 06301601   - call to cancel if needed - keep a calendar  with prescription refill dates - keep a calendar with appointment dates    Why is this important?   Part of staying healthy is seeing the doctor for follow-up care.  If you forget your appointments, there are some things you can do to stay on track.  Notes:  RN scheduled patient with [name] to get homebound COVID booster [id number] Rn notified Pharmacy to give home COVID vaccine [id number] RN went over the next appointments      (THN)Track and Manage My Blood Pressure   On track    Timeframe:  Short-Term Goal Priority:  High Start Date: 01749449                            Expected End Date:       67591638                Follow Up Date 46659935   - check blood pressure daily - choose a place to take my blood pressure (home, clinic or office, retail store) - write blood pressure results in a log or diary    Why is this important?   You won't feel high blood pressure, but it can still hurt your blood vessels.  High blood pressure can cause heart or kidney problems. It can also cause a stroke.  Making lifestyle changes like losing a little weight or eating less salt will help.  Checking your blood pressure at home and at different times of the day can help to control blood pressure.  If the doctor prescribes medicine remember to take it the way the doctor ordered.  Call the office if you cannot afford the medicine or if there are questions about it.     Notes:  RN discussed checking bp and documenting RN sent calendar book for documentation [id number] BP today is 132/86. Per patient her caregiver checks the blood pressure        Plan:  Follow-up: Patient agrees to Care Plan and Follow-up. RN notified [name] to do 2nd COVID homebound vaccine RN sent scale to patient RN will follow up outreach within the month of October RN sent update assessment to PCP RN reiterated the importance of checking BP and weight RN discussed eating healthy  Gean Maidens BSN  RN [facility] Care Management [phone]

## 2020-09-15 NOTE — Patient Instructions (Signed)
Goals Addressed             This Visit's Progress    (THN)Eat Healthy   On track    Timeframe:  Long-Range Goal Priority:  Medium Start Date:     08676195                        Expected End Date:  09326712                    Follow Up Date 45809983   - set goal weight - drink 6 to 8 glasses of water each day - manage portion size - set a realistic goal    Why is this important?   When you are ready to manage your nutrition or weight, having a plan and setting goals will help.  Taking small steps to change how you eat and exercise is a good place to start.    Notes:  RN discussed frequent small feedings to help increase her metabolism Discussed low sodium diet [id number] Per patient  weighed  today at visit. 272 lbs BMI 49     (THN)Make and Keep All Appointments   On track    Timeframe:  Long-Range Goal Priority:  Medium Start Date:   76734193                          Expected End Date:   79024097                   Follow Up Date 35329924   - call to cancel if needed - keep a calendar with prescription refill dates - keep a calendar with appointment dates    Why is this important?   Part of staying healthy is seeing the doctor for follow-up care.  If you forget your appointments, there are some things you can do to stay on track.    Notes:  RN scheduled patient with [name] to get homebound COVID booster [id number] Rn notified Pharmacy to give home COVID vaccine [id number] RN went over the next appointments      (THN)Track and Manage My Blood Pressure   On track    Timeframe:  Short-Term Goal Priority:  High Start Date: 21194174                            Expected End Date:       08144818                Follow Up Date 56314970   - check blood pressure daily - choose a place to take my blood pressure (home, clinic or office, retail store) - write blood pressure results in a log or diary    Why is this important?   You won't feel high blood pressure, but it  can still hurt your blood vessels.  High blood pressure can cause heart or kidney problems. It can also cause a stroke.  Making lifestyle changes like losing a little weight or eating less salt will help.  Checking your blood pressure at home and at different times of the day can help to control blood pressure.  If the doctor prescribes medicine remember to take it the way the doctor ordered.  Call the office if you cannot afford the medicine or if there are questions about it.     Notes:  RN discussed checking  bp and documenting RN sent calendar book for documentation [id number] BP today is 132/86. Per patient her caregiver checks the blood pressure

## 2020-09-16 ENCOUNTER — Other Ambulatory Visit: Payer: Self-pay | Admitting: *Deleted

## 2020-09-19 ENCOUNTER — Encounter: Payer: Self-pay | Admitting: Orthopaedic Surgery

## 2020-09-19 ENCOUNTER — Ambulatory Visit (INDEPENDENT_AMBULATORY_CARE_PROVIDER_SITE_OTHER): Payer: Medicare Other | Admitting: Orthopaedic Surgery

## 2020-09-19 DIAGNOSIS — M25551 Pain in right hip: Secondary | ICD-10-CM

## 2020-09-19 DIAGNOSIS — Z96641 Presence of right artificial hip joint: Secondary | ICD-10-CM
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
## 2020-09-20 ENCOUNTER — Ambulatory Visit (INDEPENDENT_AMBULATORY_CARE_PROVIDER_SITE_OTHER): Payer: Medicare Other | Admitting: Pharmacist

## 2020-09-20 ENCOUNTER — Other Ambulatory Visit: Payer: Self-pay

## 2020-09-20 DIAGNOSIS — I10 Essential (primary) hypertension: Secondary | ICD-10-CM

## 2020-09-20 NOTE — Progress Notes (Signed)
   Subjective:    Patient ID: Vanessa Palmer, female    DOB: 11/24/1950, 70 y.o.   MRN: [id number]  HPI Patient is a 70 y.o. female who presents for hypertension management. She is in good spirits and presents via telephone. Patient was referred and last seen by Primary Care Provider on 08/24/20.  Hypertension medications: amlodipine 5mg , furosemide 40mg , hydralazine 50mg  BID, losartan 25mg , metoprolol succinate 50mg    Hypertension ROS: not taking medications regularly as instructed, no medication side effects noted, patient does not perform home BP monitoring, and no chest pain on exertion.  Patient reports skipping her medications every Sunday because she goes to church.   Objective:   Last 3 Office BP readings: BP Readings from Last 3 Encounters:  08/24/20 (!) 145/89  08/15/20 (!) 158/90  03/11/20 138/80    BMET    Component Value Date/Time   NA 137 08/24/2020 1004   K 3.7 08/24/2020 1004   CL 97 08/24/2020 1004   CO2 23 08/24/2020 1004   GLUCOSE 103 (H) 08/24/2020 1004   GLUCOSE 116 (H) 08/09/2019 1845   BUN 26 08/24/2020 1004   CREATININE 1.56 (H) 08/24/2020 1004   CREATININE 1.66 (H) 08/11/2014 1200   CALCIUM 8.0 (L) 08/24/2020 1004   GFRNONAA 45 (L) 12/11/2019 1126   GFRNONAA 33 (L) 08/11/2014 1200   GFRAA 52 (L) 12/11/2019 1126   GFRAA 38 (L) 08/11/2014 1200    Renal function: CrCl cannot be calculated (Patient's most recent lab result is older than the maximum 21 days allowed.).  Clinical ASCVD: Yes  The ASCVD Risk score 08/26/2020 DC Jr., et al., 2013) failed to calculate for the following reasons:   The patient has a prior MI or stroke diagnosis  Assessment/Plan:  Hypertension control uncertain, needs further observation, and discussed with patient need to present in person if she is unable to check blood pressure readings at home .  Follow up: 1 week and as needed. Continue amlodipine 5mg , furosemide 40mg , hydralazine 50mg  BID, losartan 25mg , metoprolol  succinate 50mg   .  -Counseled on lifestyle modifications for blood pressure control including reduced dietary sodium, increased exercise, adequate sleep.  Results reviewed and written information provided.   Total time via telephone 23 minutes.

## 2020-09-23 ENCOUNTER — Other Ambulatory Visit: Payer: Self-pay | Admitting: Orthopaedic Surgery

## 2020-09-23 NOTE — Patient Instructions (Signed)
Ms. Latendresse it was a pleasure speaking with you today.   We were not able to check your blood pressure today as visit was conducted over the phone. Since you have not been checking your blood pressure at home it is important to come to office.   Continue taking blood pressure medications as prescribed.   Limiting salt intake and caffeine.  Exercising as able for at least 30 minutes for 5 days out of the week, can also help you lower your blood pressure.  Take your blood pressure at home if you are able. Please write down these numbers and bring them to your visits.  If you have any questions please call me

## 2020-09-23 NOTE — Telephone Encounter (Signed)
ok 

## 2020-09-27 ENCOUNTER — Encounter: Payer: Medicare Other | Admitting: Pharmacist

## 2020-10-05 ENCOUNTER — Telehealth: Payer: Self-pay | Admitting: Internal Medicine

## 2020-10-05 DIAGNOSIS — M2550 Pain in unspecified joint: Secondary | ICD-10-CM

## 2020-10-05 MED ORDER — OXYCODONE-ACETAMINOPHEN 10-325 MG PO TABS: 1.0000 | ORAL_TABLET | Freq: Three times a day (TID) | ORAL | 0 refills | Status: DC | PRN

## 2020-10-05 NOTE — Telephone Encounter (Signed)
Refill sent.

## 2020-10-05 NOTE — Telephone Encounter (Signed)
Last rx written  09/09/20. Last OV  08/24/20. Next OV  12/09/20. UDS  08/24/20.

## 2020-10-05 NOTE — Telephone Encounter (Signed)
Refill Request  oxyCODONE-acetaminophen (PERCOCET) 10-325 MG tablet 90 tablet 0 09/09/2020 10/09/2020       [id number] - [place], [place] - [address] AT [facility] OF [address] (Ph: [phone])

## 2020-10-20 ENCOUNTER — Other Ambulatory Visit: Payer: Self-pay | Admitting: Orthopaedic Surgery

## 2020-10-20 DIAGNOSIS — M1990 Unspecified osteoarthritis, unspecified site: Secondary | ICD-10-CM | POA: Diagnosis not present

## 2020-10-20 DIAGNOSIS — G4733 Obstructive sleep apnea (adult) (pediatric): Secondary | ICD-10-CM | POA: Diagnosis not present

## 2020-10-20 DIAGNOSIS — M2550 Pain in unspecified joint: Secondary | ICD-10-CM | POA: Diagnosis not present

## 2020-11-07 ENCOUNTER — Other Ambulatory Visit: Payer: Self-pay | Admitting: Internal Medicine

## 2020-11-07 DIAGNOSIS — M2550 Pain in unspecified joint: Secondary | ICD-10-CM

## 2020-11-07 MED ORDER — OXYCODONE-ACETAMINOPHEN 10-325 MG PO TABS: 1.0000 | ORAL_TABLET | Freq: Three times a day (TID) | ORAL | 0 refills | Status: DC | PRN

## 2020-11-07 NOTE — Telephone Encounter (Signed)
Refill Request ° ° °oxyCODONE-acetaminophen (PERCOCET) 10-325 MG tablet (Expired) ° ° ° [id number] - [place],  - [address] AT [facility] [address] (Ph: [phone])  °Order Details °

## 2020-11-11 ENCOUNTER — Other Ambulatory Visit: Payer: Self-pay | Admitting: Internal Medicine

## 2020-11-11 DIAGNOSIS — I10 Essential (primary) hypertension: Secondary | ICD-10-CM

## 2020-11-21 ENCOUNTER — Telehealth: Payer: Self-pay | Admitting: Orthopaedic Surgery

## 2020-11-21 ENCOUNTER — Other Ambulatory Visit: Payer: Self-pay | Admitting: Orthopaedic Surgery

## 2020-11-21 MED ORDER — TIZANIDINE HCL 4 MG PO TABS: ORAL_TABLET | ORAL | 0 refills | Status: DC

## 2020-11-21 NOTE — Telephone Encounter (Signed)
Pt would like refill on zanaflex ?

## 2020-11-21 NOTE — Telephone Encounter (Signed)
Please advise 

## 2020-11-29 ENCOUNTER — Telehealth: Payer: Self-pay | Admitting: Internal Medicine

## 2020-12-07 DIAGNOSIS — Z03818 Encounter for observation for suspected exposure to other biological agents ruled out: Secondary | ICD-10-CM | POA: Diagnosis not present

## 2020-12-07 DIAGNOSIS — Z20822 Contact with and (suspected) exposure to covid-19: Secondary | ICD-10-CM | POA: Diagnosis not present

## 2020-12-09 ENCOUNTER — Ambulatory Visit (INDEPENDENT_AMBULATORY_CARE_PROVIDER_SITE_OTHER): Payer: Medicare Other | Admitting: Internal Medicine

## 2020-12-09 ENCOUNTER — Other Ambulatory Visit: Payer: Self-pay

## 2020-12-09 ENCOUNTER — Encounter: Payer: Self-pay | Admitting: Internal Medicine

## 2020-12-09 VITALS — BP 130/65 | HR 63 | Temp 98.0°F | Ht 62.0 in | Wt 285.0 lb

## 2020-12-09 DIAGNOSIS — M1611 Unilateral primary osteoarthritis, right hip: Secondary | ICD-10-CM | POA: Diagnosis not present

## 2020-12-09 DIAGNOSIS — E559 Vitamin D deficiency, unspecified: Secondary | ICD-10-CM

## 2020-12-09 DIAGNOSIS — I10 Essential (primary) hypertension: Secondary | ICD-10-CM

## 2020-12-09 DIAGNOSIS — I517 Cardiomegaly: Secondary | ICD-10-CM | POA: Diagnosis not present

## 2020-12-09 DIAGNOSIS — M2550 Pain in unspecified joint: Secondary | ICD-10-CM

## 2020-12-09 DIAGNOSIS — E785 Hyperlipidemia, unspecified: Secondary | ICD-10-CM

## 2020-12-09 DIAGNOSIS — Z0000 Encounter for general adult medical examination without abnormal findings: Secondary | ICD-10-CM

## 2020-12-09 DIAGNOSIS — N1831 Chronic kidney disease, stage 3a: Secondary | ICD-10-CM

## 2020-12-09 DIAGNOSIS — R748 Abnormal levels of other serum enzymes: Secondary | ICD-10-CM

## 2020-12-09 DIAGNOSIS — G4733 Obstructive sleep apnea (adult) (pediatric): Secondary | ICD-10-CM

## 2020-12-09 DIAGNOSIS — Z23 Encounter for immunization: Secondary | ICD-10-CM | POA: Diagnosis not present

## 2020-12-09 MED ORDER — LOSARTAN POTASSIUM 50 MG PO TABS: 50.0000 mg | ORAL_TABLET | Freq: Every day | ORAL | 3 refills | Status: DC

## 2020-12-09 MED ORDER — OXYCODONE-ACETAMINOPHEN 10-325 MG PO TABS: 1.0000 | ORAL_TABLET | Freq: Three times a day (TID) | ORAL | 0 refills | Status: DC | PRN

## 2020-12-09 NOTE — Patient Instructions (Addendum)
Ms. Aguado - -  For your pain - your medication was refilled for 3 months.    For your weight - Please start adding an extra walking session in your house daily to increase your exercise tolerance.  If this gets easier, add a walk around the block.   For your vitamin D levels - Please take 2000 IU of vitamin D daily.  You can get this over the counter at your pharmacy.   For your blood pressure and kidney disease - - Please increase your losartan to 50mg  daily.  I have sent a new prescription to your pharmacy.    You are do for colon cancer screening.  You last had your screening at [facility].  The phone number there is [phone].  Please call them to schedule at your convenience.   Thank you!  Come back to see me in 3 months.

## 2020-12-09 NOTE — Progress Notes (Signed)
Subjective:    Patient ID: Vanessa Palmer, female    DOB: [birth year], 70 y.o.   MRN: [id number]  4 month follow up for pain, HTN, LVH  HPI  Vanessa Palmer is a 70 year old woman with PMH of HLD, HTN, UI, CKD with albuminuria, vitamin D deficiency, OSA, chronic pain (hip) who presents for routine follow up.  Vanessa Palmer reports that she is doing well.  Only symptom is persistent right hip pain.   For the hip pain, she is following up with her surgeon.  She has recently been started on tizanidine and she notes that this helps her get sleep at night. Due to the pain and obesity, she has difficulty with exercise.  She notes ability to walk to the bathroom and back to her bed will get her short winded.  She has a cane, walker and rolling chair.    She has recently been going back to Alcohol meetings for the past month and has been sober.  She had an issue with alcohol for a while, but is back to seeking care.    She notes that she does not have as much constipation with tolteradine.  We discussed her vitamin D deficiency and I advised that she start taking vitamin D daily for bone health.  She also is due for colon cancer screening which we discussed.    Interestingly, she has had an elevated ALP for many years.  GGT checked in 2018 when ALP was elevated was normal.  She has a DEXA from 2021 which shows normal bone density.  She does have persistently low vitamin D so maybe source is this.  Will add on GGT to CMET taken today to confirm since labs are 70 years old.     Review of Systems  Constitutional:  Negative for activity change, appetite change, fatigue and fever.  Respiratory:  Negative for cough and shortness of breath.   Cardiovascular:  Positive for leg swelling (chronic). Negative for chest pain.  Genitourinary:  Negative for difficulty urinating, dysuria, enuresis, frequency and hematuria.  Musculoskeletal:  Positive for arthralgias and gait problem.  Skin:  Negative for color change  and pallor.  Neurological:  Negative for dizziness, weakness and light-headedness.  Psychiatric/Behavioral:  Negative for decreased concentration and dysphoric mood.       Objective:   Physical Exam Constitutional:      General: She is not in acute distress.    Appearance: She is obese. She is not ill-appearing, toxic-appearing or diaphoretic.  HENT:     Head: Normocephalic and atraumatic.  Cardiovascular:     Rate and Rhythm: Normal rate and regular rhythm.     Heart sounds: No murmur heard. Pulmonary:     Effort: Pulmonary effort is normal. No respiratory distress.     Breath sounds: Normal breath sounds. No wheezing.  Abdominal:     General: Bowel sounds are normal. There is no distension.     Palpations: Abdomen is soft.     Tenderness: There is no abdominal tenderness.  Musculoskeletal:        General: Swelling and tenderness present. No deformity or signs of injury.     Comments: Pain at right groin, chronic  Skin:    General: Skin is warm and dry.  Neurological:     Mental Status: She is alert and oriented to person, place, and time. Mental status is at baseline.  Psychiatric:        Mood and Affect: Mood normal.  Behavior: Behavior normal.    Cmet, lipid panel today      Assessment & Plan:  Return in 3 months for follow up.

## 2020-12-09 NOTE — Assessment & Plan Note (Signed)
She notes that she has not been able to tolerate the mask.  She does have elevated BP and obesity which might improve with taking the sleep mask which we discussed today.  She would need a new sleep study.  We will continue to discuss.

## 2020-12-09 NOTE — Assessment & Plan Note (Signed)
I have asked her to start taking vitamin D daily.

## 2020-12-09 NOTE — Assessment & Plan Note (Signed)
Last TTE from 2021 showed grade 1 diastolic dysfunction.  She has SOB on exertion, but I think this is related to hip pain, poor exercise tolerance and weight more than a heart failure issue.    Plan Continue to monitor No overt signs of volume overload today.   Continue lasix.

## 2020-12-09 NOTE — Assessment & Plan Note (Signed)
At last check, her renal function was stable and she had continued albuminuria.   Will plan to increase losartan  Monitor blood pressure Check CMET today

## 2020-12-09 NOTE — Assessment & Plan Note (Signed)
Workup has shown a possible bone source.  We will repeat GGT and CMET today.  Dexa has been normal.  ? Vitamin D deficiency.  Further work up based on findings on blood work today.

## 2020-12-09 NOTE — Assessment & Plan Note (Signed)
BP is well controlled today at 130/65.  She is on amlodipine, lasix, losartan, metoprolol and hydralazine.  She was noted to have increased protein in the urine at last visit so we will increase the losartan today.   Plan Increase Losartan to 50mg  daily Continue other medications at current doses Recheck MAU/Cr at next visit.

## 2020-12-09 NOTE — Assessment & Plan Note (Signed)
She is due for colonoscopy - I provided her with the phone number to schedule.

## 2020-12-09 NOTE — Assessment & Plan Note (Signed)
We discussed her exercise tolerance today.  She has multiple issues which are making it difficult for her to exercise and lose weight which include decreased tolerance, SOB with ambulation and hip pain.  We discussed starting to add exercise slowly including adding one extra circuit of her home to her ambulation during the day and working up to walking around the block.  She would also benefit from aquatherapy, which we will discuss at next visit.   Plan Increase exercise Continue to discuss lifestyle modifications.

## 2020-12-09 NOTE — Assessment & Plan Note (Signed)
This is a main limiter of her mobility at this time.  She notes that she has difficulty walking due to pain, uses a cane.  She has increased work of breathing with walking.  We discussed ways to increase her exercise tolerance.  She is on chronic opiates and has no complaints today.  These medications do help her to be somewhat mobile and perform her own ADLs.  She will continue on current and chronic dose.  She was also started on tizanidine which has helped with her pain at night, allowing her to sleep.   Plan Continue oxycodone-apap - refilled today Continue tizanidine Increase exercise slowly Continue follow up with orthopedic surgery.

## 2020-12-10 LAB — CMP14 + ANION GAP
ALT: 9 IU/L (ref 0–32)
AST: 19 IU/L (ref 0–40)
Albumin/Globulin Ratio: 1.4 (ref 1.2–2.2)
Albumin: 4.2 g/dL (ref 3.8–4.8)
Alkaline Phosphatase: 136 IU/L — ABNORMAL HIGH (ref 44–121)
Anion Gap: 15 mmol/L (ref 10.0–18.0)
BUN/Creatinine Ratio: 18 (ref 12–28)
BUN: 27 mg/dL (ref 8–27)
Bilirubin Total: 0.8 mg/dL (ref 0.0–1.2)
CO2: 22 mmol/L (ref 20–29)
Calcium: 8.5 mg/dL — ABNORMAL LOW (ref 8.7–10.3)
Chloride: 98 mmol/L (ref 96–106)
Creatinine, Ser: 1.52 mg/dL — ABNORMAL HIGH (ref 0.57–1.00)
Globulin, Total: 3.1 g/dL (ref 1.5–4.5)
Glucose: 99 mg/dL (ref 70–99)
Potassium: 4 mmol/L (ref 3.5–5.2)
Sodium: 135 mmol/L (ref 134–144)
Total Protein: 7.3 g/dL (ref 6.0–8.5)
eGFR: 37 mL/min/{1.73_m2} — ABNORMAL LOW (ref >59)

## 2020-12-10 LAB — LIPID PANEL
Chol/HDL Ratio: 2.4 ratio (ref 0.0–4.4)
Cholesterol, Total: 92 mg/dL — ABNORMAL LOW (ref 100–199)
HDL: 38 mg/dL — ABNORMAL LOW (ref >39)
LDL Chol Calc (NIH): 38 mg/dL (ref 0–99)
Triglycerides: 77 mg/dL (ref 0–149)
VLDL Cholesterol Cal: 16 mg/dL (ref 5–40)

## 2020-12-13 ENCOUNTER — Other Ambulatory Visit: Payer: Self-pay | Admitting: *Deleted

## 2020-12-13 LAB — GAMMA GT: GGT: 14 IU/L (ref 0–60)

## 2020-12-13 LAB — SPECIMEN STATUS REPORT

## 2020-12-13 NOTE — Patient Outreach (Signed)
[facility]) Care Management  12/13/2020  RAYNISHA AVILLA 1950/12/24 [id number]   RN Health Coach attempted follow up outreach call to patient.  Patient was unavailable. HIPPA compliance voicemail message left with return callback number.  Plan: RN will call patient again within 30 days.  Gean Maidens BSN RN [facility] Care Management [phone]

## 2020-12-22 DIAGNOSIS — M1990 Unspecified osteoarthritis, unspecified site: Secondary | ICD-10-CM | POA: Diagnosis not present

## 2020-12-22 DIAGNOSIS — G4733 Obstructive sleep apnea (adult) (pediatric): Secondary | ICD-10-CM | POA: Diagnosis not present

## 2020-12-22 DIAGNOSIS — M2550 Pain in unspecified joint: Secondary | ICD-10-CM | POA: Diagnosis not present

## 2020-12-26 ENCOUNTER — Telehealth: Payer: Self-pay | Admitting: Orthopaedic Surgery

## 2020-12-26 ENCOUNTER — Other Ambulatory Visit: Payer: Self-pay | Admitting: Orthopaedic Surgery

## 2020-12-26 MED ORDER — TIZANIDINE HCL 4 MG PO TABS: ORAL_TABLET | ORAL | 0 refills | Status: DC

## 2020-12-26 NOTE — Telephone Encounter (Signed)
Pt called requesting a refill of muscle relaxer medication. Please send to pharmacy on file. Pt phone number is [phone].

## 2021-01-10 ENCOUNTER — Other Ambulatory Visit: Payer: Self-pay | Admitting: *Deleted

## 2021-01-11 NOTE — Patient Instructions (Signed)
Visit Information  Current Barriers:  Knowledge Deficits related to plan of care for management of HTN   RNCM Clinical Goal(s):  Patient will verbalize understanding of plan for management of HTN as evidenced by Blood pressure controlled take all medications exactly as prescribed and will call provider for medication related questions as evidenced by Continuation of monitoring blood pressure and adhering to low sodium diet    through collaboration with RN Care manager, provider, and care team.   Interventions: Inter-disciplinary care team collaboration (see longitudinal plan of care) Evaluation of current treatment plan related to  self management and patient's adherence to plan as established by provider  Patient Goals/Self-Care Activities: Patient will self administer medications as prescribed as evidenced by self report/primary caregiver report  Patient will attend all scheduled provider appointments as evidenced by clinician review of documented attendance to scheduled appointments and patient/caregiver report Patient will call pharmacy for medication refills as evidenced by patient report and review of pharmacy fill history as appropriate Patient will continue to perform ADL's independently as evidenced by patient/caregiver report Patient will continue to perform IADL's independently as evidenced by patient/caregiver report Patient will call provider office for new concerns or questions as evidenced by review of documented incoming telephone call notes and patient report - check blood pressure weekly - choose a place to take my blood pressure (home, clinic or office, retail store) - write blood pressure results in a log or diary - learn about high blood pressure - call doctor for signs and symptoms of high blood pressure - develop an action plan for high blood pressure - keep all doctor appointments - take medications for blood pressure exactly as prescribed - begin an exercise  program - limit salt intake to  less than 2300 mg/day    The patient verbalized understanding of instructions, educational materials, and care plan provided today and agreed to receive a mailed copy of patient instructions, educational materials, and care plan.   Telephone follow up appointment with care management team member scheduled for: The patient has been provided with contact information for the care management team and has been advised to call with any health related questions or concerns.   SIGNATURE  Gean Maidens BSN RN [facility] Care Management [phone]

## 2021-01-11 NOTE — Patient Outreach (Signed)
[facility]) Care Management [place] Note   01/11/2021 Name:  Vanessa Palmer MRN:  [id number] DOB:  Sep 16, 1950  Summary: Patient has been monitoring her blood pressures and stated they are better controlled. Patient has a caregiver.  Her appetite is good. Patient is monitoring her sodium. Patient is taking medications as per ordered. Patient has not received COVID booster.   Recommendations/Changes made from today's visit: RN contacted homebound pharmacy for COVID vaccine Patient will start a routine chair exercise before next outreach Patient will adhere to a less than 2,300 mg sodium per day   Subjective: Vanessa Palmer is an 70 y.o. year old female who is a primary patient of Sid Falcon, MD. The care management team was consulted for assistance with care management and/or care coordination needs.    RN Health Coach completed Telephone Visit today.   Objective:  Medications Reviewed Today     Reviewed by Verlin Grills, RN (Case Manager) on 01/11/21 at [place] List Status: <None>   Medication Order Taking? Sig Documenting Provider Last Dose Status Informant  amLODipine (NORVASC) 5 MG tablet [id number] No Take 1 tablet (5 mg total) by mouth daily. Sid Falcon, MD Taking Active   atorvastatin (LIPITOR) 40 MG tablet [id number] No Take 1 tablet (40 mg total) by mouth daily. Sid Falcon, MD Taking Active   EPINEPHrine 0.3 mg/0.3 mL IJ SOAJ injection [id number] No Inject 0.3 mg into the muscle once as needed for anaphylaxis.  Patient not taking: Reported on 09/20/2020   Sid Falcon, MD Not Taking Active   fluticasone [facility]) 50 MCG/ACT nasal spray [id number] No Place 2 sprays into both nostrils daily as needed for allergies. Sid Falcon, MD Taking Active   fluticasone [facility]FA) 44 MCG/ACT inhaler [id number] No Inhale 2 puffs into the lungs 2 (two) times daily. Sid Falcon, MD Taking Active   furosemide (LASIX) 40 MG tablet  [id number] No Take 1 tablet (40 mg total) by mouth daily. Sid Falcon, MD Taking Active   hydrALAZINE (APRESOLINE) 50 MG tablet [id number] No Take 1 tablet (50 mg total) by mouth 2 (two) times daily. Sid Falcon, MD Taking Active   losartan (COZAAR) 50 MG tablet [id number]  Take 1 tablet (50 mg total) by mouth daily. Sid Falcon, MD  Active   metoprolol succinate (TOPROL-XL) 50 MG 24 hr tablet [id number] No Take 1 tablet (50 mg total) by mouth daily. Sid Falcon, MD Taking Active   nitroGLYCERIN (NITROSTAT) 0.4 MG SL tablet [id number] No Place 1 tablet (0.4 mg total) under the tongue every 5 (five) minutes as needed for chest pain.  Patient not taking: Reported on 09/20/2020   Sid Falcon, MD Not Taking Active   omeprazole (PRILOSEC) 20 MG capsule [id number] No Take 1 capsule (20 mg total) by mouth daily. Sid Falcon, MD Taking Active   oxyCODONE-acetaminophen [facility]) 10-325 MG tablet [id number]  Take 1 tablet by mouth every 8 (eight) hours as needed for pain (chronic arthritis pain, multiple sites, diag code M16.11, M25.50). Sid Falcon, MD  Expired 01/08/21 2359   sertraline (ZOLOFT) 50 MG tablet [id number] No Take 1 tablet (50 mg total) by mouth at bedtime. Sid Falcon, MD Taking Active   tiZANidine (ZANAFLEX) 4 MG tablet [id number]  TAKE 1 TABLET(4 MG) BY MOUTH EVERY 8 HOURS AS NEEDED FOR MUSCLE SPASMS Mcarthur Rossetti, MD  Active   tolterodine (DETROL LA) 2 MG 24  hr capsule [id number] No Take 1 capsule (2 mg total) by mouth daily. Sid Falcon, MD Taking Active   triamcinolone cream (KENALOG) 0.1 % [id number] No Apply 1 application topically 2 (two) times daily. To front of legs. Sid Falcon, MD Taking Active   XARELTO 20 MG TABS tablet [id number] No TAKE ONE TABLET BY MOUTH DAILY WITH SUPPER Aldine Contes, MD Taking Active              SDOH:  (Social Determinants of Health) assessments and interventions performed:    Care Plan  Review of  patient past medical history, allergies, medications, health status, including review of consultants reports, laboratory and other test data, was performed as part of comprehensive evaluation for care management services.   Care Plan : General Plan of Care (Adult)  Updates made by Oleta Gunnoe, Eppie Gibson, RN since 01/11/2021 12:00 AM     Problem: Therapeutic Alliance (General Plan of Care) Resolved 01/10/2021  Note:   Resolving due to duplicate goal     Goal: Therapeutic Alliance Established Completed 01/11/2021  Start Date: 01/01/2020  Expected End Date: 09/24/2020  Recent Progress: On track  Priority: Medium  Note:   Evidence-based guidance:  Avoid value judgments; convey acceptance.  Encourage collaboration with the treatment team.  Establish rapport; develop trust relationship.  Therapist, art.  Provide emotional support; encourage patient to share feelings of anger, fear and anxiety.  Promote self-reliance and autonomy based on age and ability; discourage overprotection.  Use empathy and nonjudgmental, participatory manner.   Notes:     Task: Develop Relationship to Effect Behavior Change Completed 01/11/2021  Due Date: 09/24/2020  Note:   Care Management Activities:    - acceptance conveyed - care explained - choices provided - emotional support provided - empathic listening provided - questions answered - questions encouraged - rapport fostered - reassurance provided - self-reliance encouraged - verbalization of feelings encouraged    Notes:     Problem: Health Promotion or Disease Self-Management (General Plan of Care) Resolved 01/10/2021  Note:   Resolving due to duplicate goal     Problem: Coping Skills (General Plan of Care) Resolved 01/10/2021  Note:   Resolving due to duplicate goal     Long-Range Goal: Coping Skills Enhanced Completed 01/11/2021  Start Date: 01/11/2020  Expected End Date: 09/24/2020  Recent Progress: On track  Priority:  Medium  Note:   Evidence-based guidance:  Acknowledge, normalize and validate difficulty of making life-long lifestyle changes.  Identify current effective and ineffective coping strategies.  Encourage patient and caregiver participation in care to increase self-esteem, confidence and feelings of control.  Consider alternative and complementary therapy approaches such as meditation, mindfulness or yoga.  Encourage participation in cognitive behavioral therapy to foster a positive identity, increase self-awareness, as well as bolster self-esteem, confidence and self-efficacy.  Discuss spirituality; be present as concerns are identified; encourage journaling, prayer, worship services, meditation or pastoral counseling.  Encourage participation in pleasurable group activities such as hobbies, singing, sports or volunteering).  Encourage the use of mindfulness; refer for training or intensive intervention.  Consider the use of meditative movement therapy such as tai chi, yoga or qigong.  Promote a regular daily exercise program based on tolerance, ability and patient choice to support positive thinking about disease or aging.   Notes:     Task: Support Psychosocial Response to Risk or Actual Health Condition Completed 01/11/2021  Due Date: 09/24/2020  Note:   Care Management Activities:    - active  listening utilized - decision-making supported - healthy lifestyle promoted - verbalization of feelings encouraged    Notes:      Problem: Quality of Life (General Plan of Care) Resolved 01/10/2021     Long-Range Goal: Quality of Life Maintained Completed 01/10/2021  Start Date: 01/11/2020  Expected End Date: 09/24/2020  Recent Progress: On track  Priority: Medium  Note:   Evidence-based guidance:  Assess patient's thoughts about quality of life, goals and expectations, and dissatisfaction or desire to improve.  Identify issues of primary importance such as mental health, illness,  exercise tolerance, pain, sexual function and intimacy, cognitive change, social isolation, finances and relationships.  Assess and monitor for signs/symptoms of psychosocial concerns, especially depression or ideations regarding harm to others or self; provide or refer for mental health services as needed.  Identify sensory issues that impact quality of life such as hearing loss, vision deficit; strategize ways to maintain or improve hearing, vision.  Promote access to services in the community to support independence such as support groups, home visiting programs, financial assistance, handicapped parking tags, durable medical equipment and emergency responder.  Promote activities to decrease social isolation such as group support or social, leisure and recreational activities, employment, use of social media; consider safety concerns about being out of home for activities.  Provide patient an opportunity to share by storytelling or a "life review" to give positive meaning to life and to assist with coping and negative experiences.  Encourage patient to tap into hope to improve sense of self.  Counsel based on prognosis and as early as possible about end-of-life and palliative care; consider referral to palliative care provider.  Advocate for the development of palliative care plan that may include avoidance of unnecessary testing and intervention, symptom control, discontinuation of medications, hospice and organ donation.  Counsel as early as possible those with life-limiting chronic disease about palliative care; consider referral to palliative care provider.  Advocate for the development of palliative care plan.   Notes:  [id number] Resolving due to duplicate goal     Task: Support and Maintain Acceptable Degree of Health, Comfort and Happiness Completed 01/11/2021  Due Date: 09/24/2020  Note:   Care Management Activities:    - expression of thoughts about present/future encouraged -  independence in all possible areas promoted - patient strengths promoted - self-expression encouraged - wellness behaviors promoted    Notes:     Care Plan : Wellness (Adult)  Updates made by Verlin Grills, RN since 01/11/2021 12:00 AM     Problem: Healthy Nutrition (Wellness) Resolved 01/10/2021  Note:   Resolving due to duplicate goal     Long-Range Goal: Healthy Nutrition Achieved Completed 01/11/2021  Start Date: 01/01/2020  Expected End Date: 09/24/2020  Recent Progress: On track  Priority: Medium  Note:   Evidence-based guidance:  Assess patient perspective on healthy weight, weight loss or weight gain, motivation and readiness for change.  Recommend or set healthy weight goal based on body mass index.  Review current dietary intake and exercise levels.  Encourage small steps toward making change to eating and exercising.  Provide individualized medical nutrition therapy.   Notes:  Care giver assists with care and meals    Task: Alleviate Barriers to Healthy Eating Completed 01/11/2021  Due Date: 09/24/2020  Note:   Care Management Activities:    - incremental change encouraged - making healthy food choices encouraged - praise for success provided - signs of disordered eating behaviors monitored - strategies to removing  barriers to physical activity or exercise promoted    Notes:     Care Plan : [place]  Updates made by Lluvia Gwynne, Eppie Gibson, RN since 01/11/2021 12:00 AM     Problem: Knowledge Deficit related to Hypertension and Care Coordination needs   Priority: High     Long-Range Goal: Development Plan Of Care for Management of Hypertension   Start Date: 01/10/2021  Expected End Date: 02/24/2022  Priority: High  Note:   Current Barriers:  Knowledge Deficits related to plan of care for management of HTN   RNCM Clinical Goal(s):  Patient will verbalize understanding of plan for management of HTN as evidenced by Blood  pressure controlled take all medications exactly as prescribed and will call provider for medication related questions as evidenced by Continuation of monitoring blood pressure and adhering to low sodium diet    through collaboration with RN Care manager, provider, and care team.   Interventions: Inter-disciplinary care team collaboration (see longitudinal plan of care) Evaluation of current treatment plan related to  self management and patient's adherence to plan as established by provider  Patient Goals/Self-Care Activities: Patient will self administer medications as prescribed as evidenced by self report/primary caregiver report  Patient will attend all scheduled provider appointments as evidenced by clinician review of documented attendance to scheduled appointments and patient/caregiver report Patient will call pharmacy for medication refills as evidenced by patient report and review of pharmacy fill history as appropriate Patient will continue to perform ADL's independently as evidenced by patient/caregiver report Patient will continue to perform IADL's independently as evidenced by patient/caregiver report Patient will call provider office for new concerns or questions as evidenced by review of documented incoming telephone call notes and patient report - check blood pressure weekly - choose a place to take my blood pressure (home, clinic or office, retail store) - write blood pressure results in a log or diary - learn about high blood pressure - call doctor for signs and symptoms of high blood pressure - develop an action plan for high blood pressure - keep all doctor appointments - take medications for blood pressure exactly as prescribed - begin an exercise program - limit salt intake to  less than 2300 mg/day        Plan: Telephone follow up appointment with care management team member scheduled for:  February. The patient has been provided with contact information for the  care management team and has been advised to call with any health related questions or concerns.  Homebound pharmacy will administer  COVID booster RN provided educational material on chair exercises to perform  [place] [facility] [phone]

## 2021-01-24 ENCOUNTER — Other Ambulatory Visit: Payer: Self-pay | Admitting: Orthopaedic Surgery

## 2021-01-24 ENCOUNTER — Telehealth: Payer: Self-pay | Admitting: Orthopaedic Surgery

## 2021-01-24 MED ORDER — TIZANIDINE HCL 4 MG PO TABS: ORAL_TABLET | ORAL | 0 refills | Status: DC

## 2021-01-24 NOTE — Telephone Encounter (Signed)
Pt would like refill on zanaflex ?

## 2021-02-16 ENCOUNTER — Other Ambulatory Visit: Payer: Self-pay | Admitting: Internal Medicine

## 2021-02-16 DIAGNOSIS — F331 Major depressive disorder, recurrent, moderate: Secondary | ICD-10-CM

## 2021-02-22 ENCOUNTER — Telehealth: Payer: Self-pay | Admitting: Orthopaedic Surgery

## 2021-02-22 ENCOUNTER — Other Ambulatory Visit: Payer: Self-pay | Admitting: Orthopaedic Surgery

## 2021-02-22 MED ORDER — TIZANIDINE HCL 4 MG PO TABS: ORAL_TABLET | ORAL | 0 refills | Status: DC

## 2021-02-22 NOTE — Telephone Encounter (Signed)
Patient aware this was sent in for her  

## 2021-02-22 NOTE — Telephone Encounter (Signed)
Next appt scheduled 02/01/21 with PCP. 

## 2021-02-22 NOTE — Telephone Encounter (Signed)
Pt called asking for a refill of her zanaflex 4 mg rx; and she would like a CB when that's been sent in please.   [phone]

## 2021-03-07 ENCOUNTER — Other Ambulatory Visit: Payer: Self-pay | Admitting: Internal Medicine

## 2021-03-07 DIAGNOSIS — M2550 Pain in unspecified joint: Secondary | ICD-10-CM

## 2021-03-07 MED ORDER — OXYCODONE-ACETAMINOPHEN 10-325 MG PO TABS: 1.0000 | ORAL_TABLET | Freq: Three times a day (TID) | ORAL | 0 refills | Status: DC | PRN

## 2021-03-07 NOTE — Telephone Encounter (Signed)
Refill Request   oxyCODONE-acetaminophen (PERCOCET) 10-325 MG tablet (Expired)    [id number] - [place], [place] AT [address] (Ph: [phone])  Order Details

## 2021-03-07 NOTE — Telephone Encounter (Signed)
Last rx written 12/09/20. Last OV 12/09/20. Next OV 03/24/21 with PCP. UDS 08/24/20.

## 2021-03-24 ENCOUNTER — Encounter: Payer: Self-pay | Admitting: Internal Medicine

## 2021-03-24 ENCOUNTER — Telehealth: Payer: Self-pay | Admitting: Orthopaedic Surgery

## 2021-03-24 ENCOUNTER — Ambulatory Visit (INDEPENDENT_AMBULATORY_CARE_PROVIDER_SITE_OTHER): Payer: 59 | Admitting: Internal Medicine

## 2021-03-24 ENCOUNTER — Other Ambulatory Visit: Payer: Self-pay

## 2021-03-24 VITALS — BP 164/78 | HR 65 | Temp 98.2°F | Ht 62.0 in | Wt 280.6 lb

## 2021-03-24 DIAGNOSIS — I82403 Acute embolism and thrombosis of unspecified deep veins of lower extremity, bilateral: Secondary | ICD-10-CM | POA: Diagnosis not present

## 2021-03-24 DIAGNOSIS — I10 Essential (primary) hypertension: Secondary | ICD-10-CM

## 2021-03-24 DIAGNOSIS — E559 Vitamin D deficiency, unspecified: Secondary | ICD-10-CM

## 2021-03-24 DIAGNOSIS — F331 Major depressive disorder, recurrent, moderate: Secondary | ICD-10-CM

## 2021-03-24 DIAGNOSIS — R748 Abnormal levels of other serum enzymes: Secondary | ICD-10-CM | POA: Diagnosis not present

## 2021-03-24 DIAGNOSIS — N1831 Chronic kidney disease, stage 3a: Secondary | ICD-10-CM

## 2021-03-24 DIAGNOSIS — I129 Hypertensive chronic kidney disease with stage 1 through stage 4 chronic kidney disease, or unspecified chronic kidney disease: Secondary | ICD-10-CM | POA: Diagnosis not present

## 2021-03-24 DIAGNOSIS — Z23 Encounter for immunization: Secondary | ICD-10-CM

## 2021-03-24 DIAGNOSIS — R600 Localized edema: Secondary | ICD-10-CM

## 2021-03-24 DIAGNOSIS — Z1211 Encounter for screening for malignant neoplasm of colon: Secondary | ICD-10-CM

## 2021-03-24 DIAGNOSIS — F1011 Alcohol abuse, in remission: Secondary | ICD-10-CM

## 2021-03-24 DIAGNOSIS — Z0000 Encounter for general adult medical examination without abnormal findings: Secondary | ICD-10-CM

## 2021-03-24 DIAGNOSIS — M2550 Pain in unspecified joint: Secondary | ICD-10-CM

## 2021-03-24 MED ORDER — RIVAROXABAN 20 MG PO TABS: ORAL_TABLET | ORAL | 1 refills | Status: DC

## 2021-03-24 MED ORDER — LOSARTAN POTASSIUM 50 MG PO TABS: 50.0000 mg | ORAL_TABLET | Freq: Every day | ORAL | 3 refills | Status: DC

## 2021-03-24 MED ORDER — OXYCODONE-ACETAMINOPHEN 10-325 MG PO TABS: 1.0000 | ORAL_TABLET | Freq: Three times a day (TID) | ORAL | 0 refills | Status: DC | PRN

## 2021-03-24 MED ORDER — FUROSEMIDE 40 MG PO TABS: 40.0000 mg | ORAL_TABLET | Freq: Every day | ORAL | 3 refills | Status: DC

## 2021-03-24 NOTE — Assessment & Plan Note (Signed)
Patient has been taking vitamin D supplementation.  Last level was 26.8 back in June 2022.  We will recheck a vitamin D level today.

## 2021-03-24 NOTE — Assessment & Plan Note (Signed)
Blood pressure is elevated today at 164/78.  She is currently on amlodipine, Lasix, losartan, metoprolol and hydralazine.  Patient has been out of losartan and Lasix for the past week however.  Will refill these medications and have her follow-up in 1 week for a blood pressure check.

## 2021-03-24 NOTE — Assessment & Plan Note (Signed)
Patient previously had an elevated alkaline phosphatase level.  Repeat GGT was normal.  DEXA scan was also previously normal.  She does have a history of vitamin D deficiency.  We will recheck her vitamin D level today.  Also repeat a CMP.  Unclear etiology of elevation of ALP.

## 2021-03-24 NOTE — Assessment & Plan Note (Signed)
Patient reports this is a difficult month for her because this is the month anniversary in which her daughter passed away several years ago.  She is currently on sertraline 50 mg daily.  Does not feel that she has too much support at home but overall has been doing well.  She is not having trouble completing her ADLs.  Discussed that if her symptoms worsen that we can consider uptitrating her antidepressant and also referred her to Dr. Theodis Shove for therapy.  Patient is not interested in therapy at this time.  -Continue sertraline 50 mg daily

## 2021-03-24 NOTE — Assessment & Plan Note (Signed)
Patient states that she lost her medications about a week ago.  States that she is worried they were stolen.  She does keep some of her pain medications in a pillbox so she had enough until yesterday.  Her next refill is not due until 2/14.  PDMP was reviewed and appropriate.  She has been adherent to the contract.  Will refill early this 1 time.  -Refill oxycodone-acetaminophen 10-325 mg every 8 hours as needed 90 tablets

## 2021-03-24 NOTE — Progress Notes (Signed)
° °  CC: CKD, medication refills  HPI:  [name] is a 71 y.o. with a past medical history listed below presenting for 50-month follow-up. For details of today's visit and the status of his chronic medical issues please refer to the assessment and plan.   Past Medical History:  Diagnosis Date   ALCOHOL ABUSE 12/13/2005   Annotation: Sober since 11/06 Qualifier: Diagnosis of  By: Vanessa Cables MD, Vanessa Palmer     Anemia    Arthritis    "all over" (02/21/2017)   CAD (coronary artery disease)    s/p non-Q wave MI in 06/2001 and 2004, 2005   CHF (congestive heart failure) (HCC)    Chronic kidney disease (CKD), stage III (moderate) ([place]) 09/19/2010   Chronic mid back pain    Depression    DJD (degenerative joint disease)    DVT (deep venous thrombosis) (HCC)    BLE   DVT of lower extremity, bilateral ([place]) 04/04/2012   On Xarelto     GERD (gastroesophageal reflux disease)    Gout    Hyperlipidemia    Hypertension    INTRINSIC ASTHMA, WITH EXACERBATION 09/27/2009   Qualifier: Diagnosis of  By: Vanessa Scotland MD, Vanessa Palmer     Migraine    "none anymore" (02/21/2017)   Myocardial infarction ([place]) ?2005   Obesity    OSA (obstructive sleep apnea)    previously used CPAP, has lost it (02/21/2017)   Seizures ([place])    As a teenager    Review of Systems:   Review of Systems  Eyes:  Negative for blurred vision.  Respiratory:  Negative for shortness of breath.   Cardiovascular:  Positive for leg swelling. Negative for chest pain.  Gastrointestinal:  Negative for abdominal pain, nausea and vomiting.  Neurological:  Negative for dizziness and headaches.    Physical Exam:  Vitals:   03/24/21 1041 03/24/21 1055  BP: (!) 178/96 (!) 164/78  Pulse: 69 65  Temp: 98.2 F (36.8 C)   TempSrc: Oral   SpO2: 98%   Weight: 280 lb 9.6 oz (127.3 kg)   Height: 5\' 2"  (1.575 m)     Physical Exam General: alert, appears stated age, in no acute distress HEENT: Normocephalic, atraumatic, EOM intact,  conjunctiva normal CV: Regular rate and rhythm, no murmurs rubs or gallops Pulm: Clear to auscultation bilaterally, normal work of breathing Abdomen: Soft, nondistended, bowel sounds present, no tenderness to palpation MSK: No lower extremity edema Skin: Warm and dry Neuro: Alert and oriented x3   Assessment & Plan:   See Encounters Tab for problem based charting.  Patient discussed with Dr. Daryll Palmer

## 2021-03-24 NOTE — Assessment & Plan Note (Signed)
Lost her medications 1 week ago and has not been taking Xarelto since that time.  Denies any shortness of breath, chest pain or worsening of her lower extremities baseline swelling.  No calf tenderness on examination.  Will refill her Xarelto today.

## 2021-03-24 NOTE — Patient Instructions (Addendum)
Vanessa Palmer,   It was a pleasure meeting you today. Today we discussed the following:   Medications-I have sent in the refills on the medications that you are missing.  I am checking some blood work today I will call you with the results if they are abnormal.  You received the tetanus/Tdap vaccination today.  I recommend that you get the pneumonia vaccination at your next visit.  I have sent a referral in for a screening colonoscopy as well.  Someone will call you to schedule this.    Thank you for allowing [name] to be a part of your care!

## 2021-03-24 NOTE — Assessment & Plan Note (Signed)
Patient received the Tdap/tetanus vaccination today.  Also referred to GI for screening colonoscopy.  Discussed that she will need the Pneumovax at her next visit.

## 2021-03-24 NOTE — Telephone Encounter (Signed)
Please see below and advise.

## 2021-03-24 NOTE — Assessment & Plan Note (Signed)
Check CMP today 

## 2021-03-24 NOTE — Telephone Encounter (Signed)
Patient called. She would like  ZANAFLEX  called in for her. Her call back number is [phone]

## 2021-03-24 NOTE — Assessment & Plan Note (Signed)
Patient states that she is still having difficult increasing her exercise.  She states that the cold weather has really limited her activity.  She has been having difficulty losing weight due to decreased tolerance but also due to her hip pain.  Recommended that she slowly start increasing her exercise even if its 5 to 10 minutes of walking daily.  Also recommended that we refer her to the provider exercise program at the [facility].  Patient is agreeable to this plan.  -Refer to provider exercise program

## 2021-03-24 NOTE — Assessment & Plan Note (Signed)
Stable and persistent problem.  She has been out of Lasix for the past week.  Will refill today.

## 2021-03-24 NOTE — Assessment & Plan Note (Signed)
Patient states that she has been going to [employer] daily and this is really helped her.  She states that she has not had a drink of alcohol in the past 6 to 7 months except for 1 time.  Encouraged her to stay abstinent and congratulated her on her success.

## 2021-03-25 ENCOUNTER — Other Ambulatory Visit: Payer: Self-pay | Admitting: Physician Assistant

## 2021-03-25 MED ORDER — TIZANIDINE HCL 4 MG PO TABS: ORAL_TABLET | ORAL | 0 refills | Status: DC

## 2021-03-27 ENCOUNTER — Telehealth: Payer: Self-pay | Admitting: Internal Medicine

## 2021-03-27 LAB — CMP14 + ANION GAP
ALT: 9 IU/L (ref 0–32)
AST: 16 IU/L (ref 0–40)
Albumin/Globulin Ratio: 1.3 (ref 1.2–2.2)
Albumin: 4.3 g/dL (ref 3.8–4.8)
Alkaline Phosphatase: 175 IU/L — ABNORMAL HIGH (ref 44–121)
Anion Gap: 18 mmol/L (ref 10.0–18.0)
BUN/Creatinine Ratio: 17 (ref 12–28)
BUN: 25 mg/dL (ref 8–27)
Bilirubin Total: 0.4 mg/dL (ref 0.0–1.2)
CO2: 20 mmol/L (ref 20–29)
Calcium: 9.1 mg/dL (ref 8.7–10.3)
Chloride: 98 mmol/L (ref 96–106)
Creatinine, Ser: 1.51 mg/dL — ABNORMAL HIGH (ref 0.57–1.00)
Globulin, Total: 3.2 g/dL (ref 1.5–4.5)
Glucose: 109 mg/dL — ABNORMAL HIGH (ref 70–99)
Potassium: 3.9 mmol/L (ref 3.5–5.2)
Sodium: 136 mmol/L (ref 134–144)
Total Protein: 7.5 g/dL (ref 6.0–8.5)
eGFR: 37 mL/min/{1.73_m2} — ABNORMAL LOW (ref >59)

## 2021-03-27 LAB — VITAMIN D 25 HYDROXY (VIT D DEFICIENCY, FRACTURES): Vit D, 25-Hydroxy: 42.5 ng/mL (ref 30.0–100.0)

## 2021-03-27 NOTE — Telephone Encounter (Signed)
Both meds were sent to [facility] on 1/27 with Receipt confirmed by pharmacy (03/24/2021 12:13 PM EST). Call placed to patient. States Pharmacy won't give her these meds as it is too early to fill. States she spoke with [facility] and was told we could call Pharmacy to ok early refill. OV note on 1/27 does note patient thinks someone stole her meds and early refill was OK'd. Call placed to [facility]. Waited on hold for 28 minutes w/o anyone picking up. Fax sent to [facility] requesting early refills on both these meds. Requested call back to confirm.

## 2021-03-27 NOTE — Telephone Encounter (Signed)
Refill Request- Pt state she was seen on 03/24/2021.  Pt state she went to pick up her med refills and the following meds were not ready. Pt states she has not had her Blood thinner meds in 2 weeks and is really worried.  Please call the patient back.    oxyCODONE-acetaminophen (PERCOCET) 10-325 MG tablet rivaroxaban (XARELTO) 20 MG TABS tablet   [id number] - [place], [place] - [address] AT [facility] [address] (Ph: [phone])

## 2021-03-28 NOTE — Progress Notes (Signed)
Internal Medicine Clinic Attending  Case discussed with Dr. Rehman at the time of the visit.  We reviewed the resident's history and exam and pertinent patient test results.  I agree with the assessment, diagnosis, and plan of care documented in the resident's note.  

## 2021-03-31 ENCOUNTER — Telehealth: Payer: Self-pay

## 2021-03-31 ENCOUNTER — Ambulatory Visit (INDEPENDENT_AMBULATORY_CARE_PROVIDER_SITE_OTHER): Payer: 59 | Admitting: Internal Medicine

## 2021-03-31 ENCOUNTER — Encounter: Payer: Self-pay | Admitting: Internal Medicine

## 2021-03-31 ENCOUNTER — Other Ambulatory Visit: Payer: Self-pay

## 2021-03-31 DIAGNOSIS — Z86718 Personal history of other venous thrombosis and embolism: Secondary | ICD-10-CM

## 2021-03-31 DIAGNOSIS — I10 Essential (primary) hypertension: Secondary | ICD-10-CM | POA: Diagnosis not present

## 2021-03-31 NOTE — Telephone Encounter (Signed)
Call to pt reference PREP referral Explained program to pt Needs an afternoon class as she has a program she does to during day Does not have have transportation Advised will call her back for the next evening -transport resources will be challenging at that time of day
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
## 2021-03-31 NOTE — Patient Instructions (Addendum)
Ms. Sarchet - -  Your blood pressure is improved.  Please keep taking your medications as you have been.  Your weight is coming down and should continue to come down with the lasix.   I have given you refills of your Xarelto to take until you can get your prescription filled.   We will communicate with the pharmacy as well to see if we can go ahead and get your oxycodone filled.   Thank you!

## 2021-03-31 NOTE — Assessment & Plan Note (Addendum)
BP is improved.  She has no signs of end organ damage beyond her chronic disease.  Renal function has remained stable for her.  She is now back on all of her BP medications.  She has lost weight being back on lasix, but still has swelling of the lower extremities and further diuresis to be had.  She has chronic LE pain with palpation and also LE venous insufficiency due to previous DVTs.  Everything is improving in this area.   Return in 3 months Continue 4 drug therapy.

## 2021-03-31 NOTE — Assessment & Plan Note (Signed)
She has been out of her xarelto for 3 weeks which worries her.  She has bilateral LE swelling and pain, however, she was also out of her lasix and has chronic leg pain.  She was provided samples for Xarelto 20mg  daily today for 2 weeks as she waits for her refill.   Plan Continue xarelto.

## 2021-04-07 ENCOUNTER — Telehealth: Payer: Self-pay

## 2021-04-07 NOTE — Telephone Encounter (Signed)
VMT pt requesting call back to discuss next PREP classes

## 2021-04-13 ENCOUNTER — Other Ambulatory Visit: Payer: Self-pay | Admitting: *Deleted

## 2021-04-13 NOTE — Patient Outreach (Signed)
[facility]) Care Management  04/13/2021  Vanessa Palmer Nov 13, 1950 [id number]   RN Health Coach attempted follow up outreach call to patient.  Patient was unavailable. HIPPA compliance voicemail message left with return callback number.  Plan: RN will call patient again within 30 days.  [place] [phone]

## 2021-04-19 ENCOUNTER — Other Ambulatory Visit: Payer: Self-pay | Admitting: *Deleted

## 2021-04-19 NOTE — Patient Instructions (Signed)
Visit Information  Thank you for taking time to visit with me today. Please don't hesitate to contact me if I can be of assistance to you before our next scheduled telephone appointment.  Following are the goals we discussed today:  Current Barriers:  Knowledge Deficits related to plan of care for management of HTN   RNCM Clinical Goal(s):  Patient will verbalize understanding of plan for management of HTN as evidenced by continuation of monitoring blood pressure and adhering to 2300 mg low sodium diet through collaboration with RN Care manager, provider, and care team.   Interventions: Inter-disciplinary care team collaboration (see longitudinal plan of care) Evaluation of current treatment plan related to  self management and patient's adherence to plan as established by provider   Hypertension Interventions:  (Status:  Goal on track:  NO.) Long Term Goal Last practice recorded BP readings:  BP Readings from Last 3 Encounters:  03/31/21 (!) 157/74  03/24/21 (!) 164/78  12/09/20 130/65   Most recent eGFR/CrCl:  Lab Results  Component Value Date   EGFR 37 (L) 03/24/2021    No components found for: CRCL  Evaluation of current treatment plan related to hypertension self management and patient's adherence to plan as established by provider Provided assistance with obtaining home blood pressure monitor via RN sent blood pressure monitor; Counseled on the importance of exercise goals with target of 150 minutes per week  Patient Goals/Self-Care Activities: Take all medications as prescribed Attend all scheduled provider appointments Call pharmacy for medication refills 3-7 days in advance of running out of medications Attend church or other social activities Perform all self care activities independently  Perform IADL's (shopping, preparing meals, housekeeping, managing finances) independently Call provider office for new concerns or questions  call the Suicide and Crisis Lifeline:  988 if experiencing a Mental Health or [place]  check blood pressure 3 times per week choose a place to take my blood pressure (home, clinic or office, retail store) learn about high blood pressure keep a blood pressure log take blood pressure log to all doctor appointments call doctor for signs and symptoms of high blood pressure keep all doctor appointments take medications for blood pressure exactly as prescribed report new symptoms to your doctor limit salt intake to 2300 mg/day  Follow Up Plan:  Telephone follow up appointment with care management team member scheduled for:  May 2023 The patient has been provided with contact information for the care management team and has been advised to call with any health related questions or concerns.  Next PCP appointment scheduled for:  07/21/2021  Our next appointment is by telephone on May 2023  Please call Johny Shock RN [phone] if you need to cancel or reschedule your appointment.   Please call the Suicide and Crisis Lifeline: 988 if you are experiencing a Mental Health or [employer] or need someone to talk to.  The patient verbalized understanding of instructions, educational materials, and care plan provided today and agreed to receive a mailed copy of patient instructions, educational materials, and care plan.   Telephone follow up appointment with care management team member scheduled for: The patient has been provided with contact information for the care management team and has been advised to call with any health related questions or concerns.   [facility] [phone]

## 2021-04-19 NOTE — Patient Outreach (Deleted)
[facility]) Care Management [place] Note   04/19/2021 Name:  Vanessa Palmer MRN:  [id number] DOB:  [birth year]  Summary: Per patient she has not been monitoring her blood pressure. Patient stated that her blood pressure monitor is giving an Err message. She has changed batteries and still unable to get it to work. Per patient she has lost 7 lbs. She stated she walks around apartment four times a day for exercise. Marland Kitchen   Recommendations/Changes made from today's visit: Medication Adherence Contact Aero flow for adult briefs   Subjective: Vanessa Palmer is an 71 y.o. year old female who is a primary patient of Sid Falcon, MD. The care management team was consulted for assistance with care management and/or care coordination needs.    RN Health Coach completed Telephone Visit today.   Objective:  Medications Reviewed Today     Reviewed by Sid Falcon, MD (Physician) on 03/31/21 at 1032  Med List Status: <None>   Medication Order Taking? Sig Documenting Provider Last Dose Status Informant  amLODipine (NORVASC) 5 MG tablet [id number] No Take 1 tablet (5 mg total) by mouth daily. Sid Falcon, MD Taking Active   atorvastatin (LIPITOR) 40 MG tablet [id number] No Take 1 tablet (40 mg total) by mouth daily. Sid Falcon, MD Taking Active   Patient not taking:  Discontinued 03/31/21 1031   fluticasone (FLONASE) 50 MCG/ACT nasal spray [id number] No Place 2 sprays into both nostrils daily as needed for allergies. Sid Falcon, MD Taking Active   fluticasone [facility] HFA) 44 MCG/ACT inhaler [id number] No Inhale 2 puffs into the lungs 2 (two) times daily. Sid Falcon, MD Taking Active   furosemide (LASIX) 40 MG tablet [id number]  Take 1 tablet (40 mg total) by mouth daily. Rehman, Areeg N, DO  Active   hydrALAZINE (APRESOLINE) 50 MG tablet [id number] No Take 1 tablet (50 mg total) by mouth 2 (two) times daily. Sid Falcon, MD Taking Active   losartan  (COZAAR) 50 MG tablet [id number]  Take 1 tablet (50 mg total) by mouth daily. Rehman, Areeg N, DO  Active   metoprolol succinate (TOPROL-XL) 50 MG 24 hr tablet [id number] No Take 1 tablet (50 mg total) by mouth daily. Sid Falcon, MD Taking Active   nitroGLYCERIN (NITROSTAT) 0.4 MG SL tablet [id number] No Place 1 tablet (0.4 mg total) under the tongue every 5 (five) minutes as needed for chest pain.  Patient not taking: Reported on 09/20/2020   Sid Falcon, MD Not Taking Active   omeprazole (PRILOSEC) 20 MG capsule [id number] No Take 1 capsule (20 mg total) by mouth daily. Sid Falcon, MD Taking Active   oxyCODONE-acetaminophen [facility]) 10-325 MG tablet [id number]  Take 1 tablet by mouth every 8 (eight) hours as needed for pain (chronic arthritis pain, multiple sites, diag code M16.11, M25.50). Iona Beard, MD  Active   rivaroxaban (XARELTO) 20 MG TABS tablet [id number]  TAKE ONE TABLET BY MOUTH DAILY WITH SUPPER Rehman, Areeg N, DO  Active   sertraline (ZOLOFT) 50 MG tablet [id number]  TAKE 1 TABLET(50 MG) BY MOUTH AT BEDTIME Sid Falcon, MD  Active   tiZANidine (ZANAFLEX) 4 MG tablet [id number]  TAKE 1 TABLET(4 MG) BY MOUTH EVERY 8 HOURS AS NEEDED FOR MUSCLE SPASMS Pete Pelt, PA-C  Active   tolterodine (DETROL LA) 2 MG 24 hr capsule [id number] No Take 1 capsule (2 mg total) by mouth daily. Sid Falcon, MD Taking  Active   triamcinolone cream (KENALOG) 0.1 % [id number] No Apply 1 application topically 2 (two) times daily. To front of legs. Sid Falcon, MD Taking Active              SDOH:  (Social Determinants of Health) assessments and interventions performed:  SDOH Interventions    Flowsheet Row Most Recent Value  SDOH Interventions   Food Insecurity Interventions Intervention Not Indicated  Housing Interventions Intervention Not Indicated  Transportation Interventions [facility]       Care Plan  Review of patient past medical history,  allergies, medications, health status, including review of consultants reports, laboratory and other test data, was performed as part of comprehensive evaluation for care management services.   Care Plan : Hypertension (Adult)  Updates made by Verlin Grills, RN since 04/19/2021 12:00 AM     Problem: Hypertension (Hypertension) Resolved 04/19/2021  Priority: High  Onset Date: 03/23/2020  Note:   [id number] Resolving due to duplicate goal     Goal: Hypertension Monitored Completed 04/19/2021  Start Date: 01/01/2020  Expected End Date: 02/24/2021  Recent Progress: On track  Priority: High  Note:   Evidence-based guidance:  Promote initial use of ambulatory blood pressure measurements (for 3 days) to rule out "white-coat" effect; identify masked hypertension and presence or absence of nocturnal "dipping" of blood pressure.   Encourage continued use of home blood pressure monitoring and recording in blood pressure log; include symptoms of hypotension or potential medication side effects in log.  Review blood pressure measurements taken inside and outside of the provider office; establish baseline and monitor trends; compare to target ranges or patient goal.  Share overall cardiovascular risk with patient; encourage changes to lifestyle risk factors, including alcohol consumption, smoking, inadequate exercise, poor dietary habits and stress.   Notes:  [id number] BP today 132/86    Task: Identify and Monitor Blood Pressure Elevation Completed 04/19/2021  Due Date: 02/24/2021  Note:   Care Management Activities:    - home or ambulatory blood pressure monitoring encouraged    Notes:  Provided a new BP monitor and patient is now self monitoring blood pressure    Problem: Disease Progression (Hypertension) Resolved 04/19/2021  Priority: Medium  Onset Date: 01/01/2020  Note:   [id number] Resolving due to duplicate goal     Long-Range Goal: Disease Progression Prevented or Minimized  Completed 04/19/2021  Start Date: 01/01/2020  Expected End Date: 02/24/2021  Recent Progress: On track  Priority: Medium  Note:   Evidence-based guidance:  Tailor lifestyle advice to individual; review progress regularly; give frequent encouragement and respond positively to incremental successes.  Assess for and promote awareness of worsening disease or development of comorbidity.  Prepare patient for laboratory and diagnostic exams based on risk and presentation.  Prepare patient for use of pharmacologic therapy that may include diuretic, beta-blocker, beta-blocker/thiazide combination, angiotensin-converting enzyme inhibitor, renin-angiotensin blocker or calcium-channel blocker.  Expect periodic adjustments to pharmacologic therapy; manage side effects.  Promote a healthy diet that includes primarily plant-based foods, such as fruits, vegetables, whole grains, beans and legumes, low-fat dairy and lean meats.   Consider moderate reduction in sodium intake by avoiding the addition of salt to prepared foods and limiting processed meats, canned soup, frozen meals and salty snacks.   Promote a regular, daily exercise goal of 150 minutes per week of moderate exercise based on tolerance, ability and patient choice; consider referral to physical therapist, community wellness and/or activity program.  Encourage the avoidance of no  more than 2 hours per day of sedentary activity, such as recreational screen time.  Review sources of stress; explore current coping strategies and encourage use of mindfulness, yoga, meditation or exercise to manage stress.   Notes:     Task: Alleviate Barriers to Hypertension Treatment Completed 04/19/2021  Due Date: 02/24/2021  Note:   Care Management Activities:    - healthy diet promoted - patient response to treatment assessed - reduction of dietary sodium encouraged - reduction in sedentary activities encouraged    Notes:     Problem: Resistant Hypertension  (Hypertension) Resolved 04/19/2021  Priority: Medium  Onset Date: 01/01/2020  Note:   [id number] Resolving due to duplicate goal     Long-Range Goal: Response to Treatment Maximized Completed 04/19/2021  Start Date: 03/23/2020  Expected End Date: 02/24/2021  Recent Progress: On track  Priority: Medium  Note:   Evidence-based guidance:  Assess patient response to treatment, including presence or absence of medication side effects, degree of blood pressure control and patient satisfaction.  Assess technique (including cuff size and placement), measurement times, condition and calibration of blood pressure cuff set (both at-home and in-office equipment).  Assess factors that may influence response to treatment, including nonadherence to pharmacologic treatment plan, diet or activity changes and/or presence of pain, stress or sleep disturbance.  Screen for signs and symptoms of depression; if present, refer for or complete a comprehensive assessment.  Evaluate social and economic barriers that may affect adherence to treatment plan  Address pharmacologic nonadherence by simplifying dosing regimen, counseling or support by pharmacist, financial assistance, self-monitoring of blood pressure, use of motivational interviewing, voice or text messages.  Encourage behavioral adherence strategies, like habit-based interventions that link medication taking with existing daily routines.  Assess barriers to regular, daily physical activity; support family or support person-oriented activity changes and utilization of community activity or sports program.  Address barriers to dietary changes, especially sodium restriction, with referrals to community programs, like cooking classes, meal services or intensive education when available.  Refer to community-based peer support program or nurse home-visiting program.  Assess for chronic pain; when present add additional goals (Chronic Pain Care Plan Guide) as needed.   Provide frequent follow-up by telephone, telemonitoring, patient-practice portal or with home visit.  Review alcohol use screen; address using brief intervention beginning with risk that interferes with blood pressure control; refer for treatment when excessive alcohol use is noted.  Screen for obstructive sleep apnea; prepare patient for polysomnography based on risk and presentation and use of noninvasive ventilation to relieve obstructive sleep apnea when present.   Notes:     Task: Facilitate Adherence to Lifestyle Change Completed 04/19/2021  Due Date: 02/24/2021  Note:   Care Management Activities:    - barriers to lifestyle change identified - home blood pressure monitoring technique reviewed - medication adherence assessment completed - success praised - support and encouragement provided    Notes:  Provided educational material on how to check blood pressure    Care Plan : [place] of Care  Updates made by Tasfia Vasseur, Eppie Gibson, RN since 04/19/2021 12:00 AM     Problem: Knowledge Deficit Related to Hypertension and Care Coordination Needs   Priority: High     Long-Range Goal: Development Plan of Care for Management of Hypertension   Start Date: 04/19/2021  Expected End Date: 02/24/2022  Priority: High  Note:   Current Barriers:  Knowledge Deficits related to plan of care for management of HTN   RNCM  Clinical Goal(s):  Patient will verbalize understanding of plan for management of HTN as evidenced by continuation of monitoring blood pressure and adhering to 2300 mg low sodium diet  through collaboration with RN Care manager, provider, and care team.   Interventions: Inter-disciplinary care team collaboration (see longitudinal plan of care) Evaluation of current treatment plan related to  self management and patient's adherence to plan as established by provider   Hypertension Interventions:  (Status:  Goal on track:  NO.) Long Term Goal Last practice  recorded BP readings:  BP Readings from Last 3 Encounters:  03/31/21 (!) 157/74  03/24/21 (!) 164/78  12/09/20 130/65  Most recent eGFR/CrCl:  Lab Results  Component Value Date   EGFR 37 (L) 03/24/2021    No components found for: CRCL  Evaluation of current treatment plan related to hypertension self management and patient's adherence to plan as established by provider Provided assistance with obtaining home blood pressure monitor via RN sent blood pressure monitor; Counseled on the importance of exercise goals with target of 150 minutes per week  Patient Goals/Self-Care Activities: Take all medications as prescribed Attend all scheduled provider appointments Call pharmacy for medication refills 3-7 days in advance of running out of medications Attend church or other social activities Perform all self care activities independently  Perform IADL's (shopping, preparing meals, housekeeping, managing finances) independently Call provider office for new concerns or questions  call the Suicide and Crisis Lifeline: 988 if experiencing a Mental Health or [place]  check blood pressure 3 times per week choose a place to take my blood pressure (home, clinic or office, retail store) learn about high blood pressure keep a blood pressure log take blood pressure log to all doctor appointments call doctor for signs and symptoms of high blood pressure keep all doctor appointments take medications for blood pressure exactly as prescribed report new symptoms to your doctor limit salt intake to 2300 mg/day  Follow Up Plan:  Telephone follow up appointment with care management team member scheduled for:  May 2023 The patient has been provided with contact information for the care management team and has been advised to call with any health related questions or concerns.  Next PCP appointment scheduled for:  07/21/2021      Plan: Telephone follow up appointment with care  management team member scheduled for:  May 2023 The patient has been provided with contact information for the care management team and has been advised to call with any health related questions or concerns.  RN contacted [facility]   [place] [phone]

## 2021-04-20 NOTE — Patient Outreach (Signed)
[facility]) Care Management RN Health Coach Note   04/20/2021 Name:  Vanessa Palmer MRN:  [id number] DOB:  01/26/1951  Summary: Per patient she has not been monitoring her blood pressure. Patient stated that her blood pressure monitor is giving an Err message. She has changed batteries and still unable to get it to work. Per patient she has lost 7 lbs. She stated she walks around apartment four times a day for exercise  Recommendations/Changes made from today's visit: Medication Adherence Contact Aero flow for adult briefs   Subjective: Vanessa Palmer is an 71 y.o. year old female who is a primary patient of Inez Catalina, MD. The care management team was consulted for assistance with care management and/or care coordination needs.    RN Health Coach completed Telephone Visit today.   Objective:  Medications Reviewed Today     Reviewed by Inez Catalina, MD (Physician) on 03/31/21 at 1032  Med List Status: <None>   Medication Order Taking? Sig Documenting Provider Last Dose Status Informant  amLODipine (NORVASC) 5 MG tablet [id number] No Take 1 tablet (5 mg total) by mouth daily. Inez Catalina, MD Taking Active   atorvastatin (LIPITOR) 40 MG tablet [id number] No Take 1 tablet (40 mg total) by mouth daily. Inez Catalina, MD Taking Active   Patient not taking:  Discontinued 03/31/21 1031   fluticasone (FLONASE) 50 MCG/ACT nasal spray [id number] No Place 2 sprays into both nostrils daily as needed for allergies. Inez Catalina, MD Taking Active   fluticasone [facility] HFA) 44 MCG/ACT inhaler [id number] No Inhale 2 puffs into the lungs 2 (two) times daily. Inez Catalina, MD Taking Active   furosemide (LASIX) 40 MG tablet [id number]  Take 1 tablet (40 mg total) by mouth daily. Rehman, Areeg N, DO  Active   hydrALAZINE (APRESOLINE) 50 MG tablet [id number] No Take 1 tablet (50 mg total) by mouth 2 (two) times daily. Inez Catalina, MD Taking Active   losartan  (COZAAR) 50 MG tablet [id number]  Take 1 tablet (50 mg total) by mouth daily. Rehman, Areeg N, DO  Active   metoprolol succinate (TOPROL-XL) 50 MG 24 hr tablet [id number] No Take 1 tablet (50 mg total) by mouth daily. Inez Catalina, MD Taking Active   nitroGLYCERIN (NITROSTAT) 0.4 MG SL tablet [id number] No Place 1 tablet (0.4 mg total) under the tongue every 5 (five) minutes as needed for chest pain.  Patient not taking: Reported on 09/20/2020   Inez Catalina, MD Not Taking Active   omeprazole (PRILOSEC) 20 MG capsule [id number] No Take 1 capsule (20 mg total) by mouth daily. Inez Catalina, MD Taking Active   oxyCODONE-acetaminophen [facility]) 10-325 MG tablet [id number]  Take 1 tablet by mouth every 8 (eight) hours as needed for pain (chronic arthritis pain, multiple sites, diag code M16.11, M25.50). Quincy Simmonds, MD  Active   rivaroxaban (XARELTO) 20 MG TABS tablet [id number]  TAKE ONE TABLET BY MOUTH DAILY WITH SUPPER Rehman, Areeg N, DO  Active   sertraline (ZOLOFT) 50 MG tablet [id number]  TAKE 1 TABLET(50 MG) BY MOUTH AT BEDTIME Inez Catalina, MD  Active   tiZANidine (ZANAFLEX) 4 MG tablet [id number]  TAKE 1 TABLET(4 MG) BY MOUTH EVERY 8 HOURS AS NEEDED FOR MUSCLE SPASMS Kirtland Bouchard, PA-C  Active   tolterodine (DETROL LA) 2 MG 24 hr capsule [id number] No Take 1 capsule (2 mg total) by mouth daily. Inez Catalina, MD Taking Active  triamcinolone cream (KENALOG) 0.1 % [id number] No Apply 1 application topically 2 (two) times daily. To front of legs. Inez Catalina, MD Taking Active              SDOH:  (Social Determinants of Health) assessments and interventions performed:  SDOH Interventions    Flowsheet Row Most Recent Value  SDOH Interventions   Food Insecurity Interventions Intervention Not Indicated  Housing Interventions Intervention Not Indicated  Transportation Interventions [facility]       Care Plan  Review of patient past medical history,  allergies, medications, health status, including review of consultants reports, laboratory and other test data, was performed as part of comprehensive evaluation for care management services.   There are no care plans that you recently modified to display for this patient.    Plan: Telephone follow up appointment with care management team member scheduled for:  May 2023 The patient has been provided with contact information for the care management team and has been advised to call with any health related questions or concerns.   Gean Maidens BSN RN [facility] Care Management [phone]

## 2021-05-01 ENCOUNTER — Telehealth: Payer: Self-pay | Admitting: Orthopaedic Surgery

## 2021-05-01 ENCOUNTER — Other Ambulatory Visit: Payer: Self-pay | Admitting: Orthopaedic Surgery

## 2021-05-01 MED ORDER — TIZANIDINE HCL 4 MG PO TABS: ORAL_TABLET | ORAL | 0 refills | Status: DC

## 2021-05-01 NOTE — Telephone Encounter (Signed)
Patient called  needing Rx refilled Tizanidine. The number to contact patient is [phone] ?

## 2021-05-01 NOTE — Telephone Encounter (Signed)
Please advise 

## 2021-05-08 ENCOUNTER — Other Ambulatory Visit: Payer: Self-pay

## 2021-05-08 DIAGNOSIS — M2550 Pain in unspecified joint: Secondary | ICD-10-CM

## 2021-05-08 MED ORDER — OXYCODONE-ACETAMINOPHEN 10-325 MG PO TABS: 1.0000 | ORAL_TABLET | Freq: Three times a day (TID) | ORAL | 0 refills | Status: DC | PRN

## 2021-05-08 NOTE — Telephone Encounter (Signed)
oxyCODONE-acetaminophen (PERCOCET) 10-325 MG tablet (Expired, REFILL REQUEST @ [id number] - [place], [place] - [address] AT [facility] [address] ?

## 2021-05-08 NOTE — Telephone Encounter (Signed)
Last ToxAssure 08/24/2020.  Last appointment 03/31/2021.  No future appts scheduled. ?

## 2021-05-11 ENCOUNTER — Other Ambulatory Visit: Payer: Self-pay | Admitting: *Deleted

## 2021-05-11 MED ORDER — OXYCODONE-ACETAMINOPHEN 10-325 MG PO TABS: 1.0000 | ORAL_TABLET | Freq: Three times a day (TID) | ORAL | 0 refills | Status: DC | PRN

## 2021-05-11 NOTE — Telephone Encounter (Signed)
RTC to patient to get the name of the pharmacy to send in the [place].  Message was left to call the Clinics with the name of the Pharmacy to send in the new prescription. ? ?Call from patient states her Pharmacy does not have the Percocet in stock.  Expecting some in a few days.  Patient was informed to locate a Pharmacy that has Percocet so that the doctor can send over a new prescription.   ? ? ? ?

## 2021-05-11 NOTE — Telephone Encounter (Signed)
Refill sent   Thank you

## 2021-05-11 NOTE — Telephone Encounter (Signed)
RTC to patient would like for prescription to go to the [facility] on [address]. ?

## 2021-06-05 ENCOUNTER — Other Ambulatory Visit: Payer: Self-pay | Admitting: Orthopaedic Surgery

## 2021-06-05 ENCOUNTER — Telehealth: Payer: Self-pay | Admitting: Orthopaedic Surgery

## 2021-06-05 MED ORDER — TIZANIDINE HCL 4 MG PO TABS: ORAL_TABLET | ORAL | 0 refills | Status: DC

## 2021-06-05 NOTE — Telephone Encounter (Signed)
Please advise 

## 2021-06-05 NOTE — Telephone Encounter (Signed)
Pt called requesting refill on muscle relaxer. Please send to pharmacy on file. Pt phone number is [phone] ?

## 2021-06-07 ENCOUNTER — Other Ambulatory Visit: Payer: Self-pay | Admitting: Internal Medicine

## 2021-06-07 DIAGNOSIS — M2550 Pain in unspecified joint: Secondary | ICD-10-CM

## 2021-06-07 MED ORDER — OXYCODONE-ACETAMINOPHEN 10-325 MG PO TABS: 1.0000 | ORAL_TABLET | Freq: Three times a day (TID) | ORAL | 0 refills | Status: DC | PRN

## 2021-06-07 NOTE — Telephone Encounter (Signed)
Refill  Request  oxyCODONE-acetaminophen (PERCOCET) 10-325 MG tablet  [id number] - [place], [place] - [address] 

## 2021-07-05 ENCOUNTER — Telehealth: Payer: Self-pay | Admitting: Orthopaedic Surgery

## 2021-07-05 ENCOUNTER — Telehealth: Payer: Self-pay

## 2021-07-05 ENCOUNTER — Other Ambulatory Visit: Payer: Self-pay | Admitting: Orthopaedic Surgery

## 2021-07-05 DIAGNOSIS — M2550 Pain in unspecified joint: Secondary | ICD-10-CM

## 2021-07-05 MED ORDER — TIZANIDINE HCL 4 MG PO TABS: ORAL_TABLET | ORAL | 0 refills | Status: DC

## 2021-07-05 MED ORDER — OXYCODONE-ACETAMINOPHEN 10-325 MG PO TABS: 1.0000 | ORAL_TABLET | Freq: Three times a day (TID) | ORAL | 0 refills | Status: DC | PRN

## 2021-07-05 NOTE — Telephone Encounter (Signed)
Please send in Muscle Relaxer to the pharmacy  ?

## 2021-07-05 NOTE — Telephone Encounter (Signed)
oxyCODONE-acetaminophen (PERCOCET) 10-325 MG tablet, REFILL REQUEST @ [facility] [phone] - [place] - [address] AT [address] ?

## 2021-07-05 NOTE — Telephone Encounter (Signed)
Last ToxAssure was 08/24/2020.  Next appointment is 07/21/2021. ?

## 2021-07-21 ENCOUNTER — Encounter: Payer: Self-pay | Admitting: Internal Medicine

## 2021-07-21 ENCOUNTER — Other Ambulatory Visit: Payer: Self-pay

## 2021-07-21 ENCOUNTER — Ambulatory Visit (INDEPENDENT_AMBULATORY_CARE_PROVIDER_SITE_OTHER): Payer: 59 | Admitting: Internal Medicine

## 2021-07-21 VITALS — BP 135/64 | HR 82 | Temp 97.6°F | Ht 62.0 in | Wt 274.9 lb

## 2021-07-21 DIAGNOSIS — I10 Essential (primary) hypertension: Secondary | ICD-10-CM

## 2021-07-21 DIAGNOSIS — R600 Localized edema: Secondary | ICD-10-CM

## 2021-07-21 DIAGNOSIS — M2550 Pain in unspecified joint: Secondary | ICD-10-CM

## 2021-07-21 DIAGNOSIS — I208 Other forms of angina pectoris: Secondary | ICD-10-CM

## 2021-07-21 DIAGNOSIS — I129 Hypertensive chronic kidney disease with stage 1 through stage 4 chronic kidney disease, or unspecified chronic kidney disease: Secondary | ICD-10-CM

## 2021-07-21 DIAGNOSIS — Z86718 Personal history of other venous thrombosis and embolism: Secondary | ICD-10-CM

## 2021-07-21 DIAGNOSIS — I82403 Acute embolism and thrombosis of unspecified deep veins of lower extremity, bilateral: Secondary | ICD-10-CM | POA: Diagnosis not present

## 2021-07-21 DIAGNOSIS — F331 Major depressive disorder, recurrent, moderate: Secondary | ICD-10-CM

## 2021-07-21 DIAGNOSIS — J4520 Mild intermittent asthma, uncomplicated: Secondary | ICD-10-CM

## 2021-07-21 DIAGNOSIS — I2089 Other forms of angina pectoris: Secondary | ICD-10-CM

## 2021-07-21 DIAGNOSIS — N1831 Chronic kidney disease, stage 3a: Secondary | ICD-10-CM

## 2021-07-21 MED ORDER — RIVAROXABAN 20 MG PO TABS: ORAL_TABLET | ORAL | 1 refills | Status: DC

## 2021-07-21 MED ORDER — OXYCODONE-ACETAMINOPHEN 10-325 MG PO TABS: 1.0000 | ORAL_TABLET | Freq: Four times a day (QID) | ORAL | 0 refills | Status: DC | PRN

## 2021-07-21 NOTE — Assessment & Plan Note (Signed)
This was her main concern today.  She is having worsening despite change to torsemide.  I think her swelling is multifactorial, but certainly venous insufficiency is playing a part.    Plan Referral to wound care clinic for consideration of Unna boots.

## 2021-07-21 NOTE — Progress Notes (Signed)
Established Patient Office Visit  Subjective   Patient ID: Vanessa Palmer, female    DOB: Jan 22, 1951  Age: 71 y.o. MRN: [id number]  Chief Complaint  Patient presents with   3 Month F/U visit    Taken off Lasix. LLE's edema, pain, and blisters.    Ms. Okelly is a 71 year old woman with PMH of CAD/LVH, HTN, Asthma, GERD, CKD 3, urinary incontinence, anxiety/MDD, LE swelling who presents for follow up.   Ms. Bogacz reports that her LE swelling is problematic for her and causing some blistering and open wounds on the legs.  She was recently switched to torsemide from lasix at her cardiologists, but she does not feel that this is making a difference.  We discussed that she has 3 issues leading to this problem, LVH, CKD and also h/o DVTs with venous insufficiency as a result.  I think she would benefit for evaluation for Unna boots and she is amenable to this.    She notes increased pain in her hip.  She has a replacement there.  She is having decreased sleep and decreased mobility due to this.  She notes about 2-3 times per week, she will take a 4th dose of her oxycodone daily.  She has been on a chronic stable dose for a long time.  She does have constipation.  She is taking MOM for this, but has a lot of cramping.  We discussed that taking something high in magnesium could be problematic with her CKD, so we discussed an alternative regimen.     Review of Systems  Constitutional:  Negative for chills, diaphoresis, fever, malaise/fatigue and weight loss.  Respiratory:  Negative for cough and wheezing.   Cardiovascular:  Positive for chest pain (took NTG and improved) and leg swelling. Negative for palpitations.  Gastrointestinal:  Positive for constipation and heartburn.  Genitourinary:  Positive for frequency and urgency.  Musculoskeletal:  Positive for back pain and joint pain.  Neurological:  Negative for dizziness and weakness.  Psychiatric/Behavioral:  Negative for depression. The  patient is nervous/anxious.      Objective:     BP 135/64 (BP Location: Right Arm, Patient Position: Sitting, Cuff Size: Large)   Pulse 82   Temp 97.6 F (36.4 C) (Oral)   Ht 5\' 2"  (1.575 m)   Wt 274 lb 14.4 oz (124.7 kg)   SpO2 100% Comment: RA  BMI 50.28 kg/m  BP Readings from Last 3 Encounters:  07/21/21 135/64  03/31/21 (!) 157/74  03/24/21 (!) 164/78      Physical Exam Vitals and nursing note reviewed.  Constitutional:      General: She is not in acute distress.    Appearance: Normal appearance. She is obese. She is not toxic-appearing.     Comments: In motorized WC  HENT:     Head: Normocephalic and atraumatic.  Cardiovascular:     Rate and Rhythm: Normal rate and regular rhythm.     Heart sounds: No murmur heard.   No gallop.  Pulmonary:     Effort: Pulmonary effort is normal. No respiratory distress.  Musculoskeletal:        General: Swelling and tenderness present.     Right lower leg: Edema present.     Left lower leg: Edema present.  Skin:    General: Skin is warm.     Findings: Lesion (mid shin on the right leg, shearing of skin, quarter sized, no drainage, no pus) present.     Comments: Skin  changes on chronic venous insufficiency to the legs, discoloration in a lace like patter with lighter areas of pigmentation.   Neurological:     General: No focal deficit present.     Mental Status: She is alert. Mental status is at baseline.  Psychiatric:        Mood and Affect: Mood normal.        Behavior: Behavior normal.      Assessment & Plan:   Problem List Items Addressed This Visit       Unprioritized   MAJOR DPRSV DISORDER RECURRENT EPISODE MODERATE (Chronic)    She is taking sertraline without issue.  We did not have time to address this further today.  Follow up at next visit.        Essential hypertension (Chronic)    Blood pressure today is well controlled.  She has no complaints of headache.  She had one episode of chest pain for which  she took NTG with good results.  She was recently changed to torsemide and potassium for her LE swelling and blood pressure control.  135/64 today.  She is mostly inactive, using a motorized WC for ambulation due to chronic OA.   Plan Continue amlodipine, hydralazine, losartan, metoprolol, torsemide CMET today for renal function.        Relevant Medications   torsemide (DEMADEX) 20 MG tablet   rivaroxaban (XARELTO) 20 MG TABS tablet   Other Relevant Orders   CMP14 + Anion Gap   Asthma, chronic (Chronic)    She notes good control of symptoms.  She has not been having significant issues with her allergies this year, which she is thankful for.  Currently without any wheezing on exam  Plan Continue flonase and flovent.        Pain in joint, multiple sites (Chronic)    She has continued chronic pain in the hip, knees and back.  She notes increased pain since having her right hip replaced.  She has followed up with her surgeon and the hip joint is in the appropriate place.  I think some of this is due to her relative sedentary lifestyle.  She is requesting an increase in her medication so that she can take an extra oxycodone-acetaminophen/day.  I think this is reasonable.  She has been chronic and stable on this medication for a long while.  She has constipation with is as well, and we discussed a regimen to help with this.   Plan Increase oxycodone-apap to 1 tab q6 hours PRN #120 UDS next visit Miralax daily Colace daily MOM only once a week at most Check renal function today.        Relevant Medications   oxyCODONE-acetaminophen (PERCOCET) 10-325 MG tablet   Chronic kidney disease (CKD), stage III (moderate) (HCC) (Chronic)    Chronic, has been stable.  Repeat CMET today.        Relevant Orders   CMP14 + Anion Gap   History of DVT (deep vein thrombosis), multiple, lifelong anticoagulation    She has noted decreased ability to get her Rx.  Have sent in a new prescription to the  pharmacy.         Relevant Medications   rivaroxaban (XARELTO) 20 MG TABS tablet   Bilateral lower extremity edema - Primary    This was her main concern today.  She is having worsening despite change to torsemide.  I think her swelling is multifactorial, but certainly venous insufficiency is playing a part.  Plan Referral to wound care clinic for consideration of Unna boots.        Relevant Medications   torsemide (DEMADEX) 20 MG tablet   potassium chloride SA (KLOR-CON M) 20 MEQ tablet   Other Relevant Orders   Ambulatory referral to Wound Clinic   Chronic stable angina ([place])    She notes 1 episode of substernal chest pain which was relieved with nitroglycerin.  Otherwise, she is doing well. S he has good control of her BP today.  She is taking metoprolol, losartan and torsemide without issue.  She is following up regularly with cardiology.   Continue to monitor.        Relevant Medications   torsemide (DEMADEX) 20 MG tablet   rivaroxaban (XARELTO) 20 MG TABS tablet   oxyCODONE-acetaminophen (PERCOCET) 10-325 MG tablet   Other Visit Diagnoses     Deep vein thrombosis (DVT) of both lower extremities, unspecified chronicity, unspecified vein (HCC)       Relevant Medications   torsemide (DEMADEX) 20 MG tablet   rivaroxaban (XARELTO) 20 MG TABS tablet       Return in about 3 months (around 10/21/2021).    Gilles Chiquito, MD

## 2021-07-21 NOTE — Assessment & Plan Note (Signed)
Blood pressure today is well controlled.  She has no complaints of headache.  She had one episode of chest pain for which she took NTG with good results.  She was recently changed to torsemide and potassium for her LE swelling and blood pressure control.  135/64 today.  She is mostly inactive, using a motorized WC for ambulation due to chronic OA.   Plan Continue amlodipine, hydralazine, losartan, metoprolol, torsemide CMET today for renal function.

## 2021-07-21 NOTE — Assessment & Plan Note (Signed)
She is taking sertraline without issue.  We did not have time to address this further today.  Follow up at next visit.

## 2021-07-21 NOTE — Assessment & Plan Note (Signed)
She has noted decreased ability to get her Rx.  Have sent in a new prescription to the pharmacy.

## 2021-07-21 NOTE — Assessment & Plan Note (Signed)
She notes good control of symptoms.  She has not been having significant issues with her allergies this year, which she is thankful for.  Currently without any wheezing on exam  Plan Continue flonase and flovent.

## 2021-07-21 NOTE — Patient Instructions (Addendum)
Vanessa Palmer - -  It was a pleasure to see you.   I Have increased your pain medication so that you can take 4 tablets per day as needed for pain.    I have placed a referral for the Wound Care Clinic to see if you qualify for UNNA boots to help with the swelling in your legs.   I have refilled your Xarelto, please keep taking this medication.    Please come back to see me in 3-4 months

## 2021-07-21 NOTE — Assessment & Plan Note (Signed)
She has continued chronic pain in the hip, knees and back.  She notes increased pain since having her right hip replaced.  She has followed up with her surgeon and the hip joint is in the appropriate place.  I think some of this is due to her relative sedentary lifestyle.  She is requesting an increase in her medication so that she can take an extra oxycodone-acetaminophen/day.  I think this is reasonable.  She has been chronic and stable on this medication for a long while.  She has constipation with is as well, and we discussed a regimen to help with this.   Plan Increase oxycodone-apap to 1 tab q6 hours PRN #120 UDS next visit Miralax daily Colace daily MOM only once a week at most Check renal function today.

## 2021-07-21 NOTE — Assessment & Plan Note (Signed)
She notes 1 episode of substernal chest pain which was relieved with nitroglycerin.  Otherwise, she is doing well. S he has good control of her BP today.  She is taking metoprolol, losartan and torsemide without issue.  She is following up regularly with cardiology.   Continue to monitor.

## 2021-07-21 NOTE — Assessment & Plan Note (Signed)
Chronic, has been stable.  Repeat CMET today.

## 2021-07-22 LAB — CMP14 + ANION GAP
ALT: 13 IU/L (ref 0–32)
AST: 18 IU/L (ref 0–40)
Albumin/Globulin Ratio: 1 — ABNORMAL LOW (ref 1.2–2.2)
Albumin: 4.1 g/dL (ref 3.8–4.8)
Alkaline Phosphatase: 176 IU/L — ABNORMAL HIGH (ref 44–121)
Anion Gap: 17 mmol/L (ref 10.0–18.0)
BUN/Creatinine Ratio: 17 (ref 12–28)
BUN: 23 mg/dL (ref 8–27)
Bilirubin Total: 0.6 mg/dL (ref 0.0–1.2)
CO2: 23 mmol/L (ref 20–29)
Calcium: 9.4 mg/dL (ref 8.7–10.3)
Chloride: 98 mmol/L (ref 96–106)
Creatinine, Ser: 1.39 mg/dL — ABNORMAL HIGH (ref 0.57–1.00)
Globulin, Total: 4 g/dL (ref 1.5–4.5)
Glucose: 115 mg/dL — ABNORMAL HIGH (ref 70–99)
Potassium: 3.7 mmol/L (ref 3.5–5.2)
Sodium: 138 mmol/L (ref 134–144)
Total Protein: 8.1 g/dL (ref 6.0–8.5)
eGFR: 41 mL/min/{1.73_m2} — ABNORMAL LOW (ref >59)

## 2021-07-25 ENCOUNTER — Other Ambulatory Visit: Payer: Self-pay | Admitting: *Deleted

## 2021-07-25 NOTE — Patient Outreach (Signed)
[facility]) Care Management  07/25/2021  Vanessa Palmer May 05, 1950 [id number]  RN Health Coach attempted follow up outreach call to patient.  Patient was unavailable. HIPPA compliance voicemail message left with return callback number.  Plan: RN will call patient again within 30 days.  Vanessa Palmer BSN RN [facility] Care Management [phone]

## 2021-07-28 ENCOUNTER — Encounter: Payer: Self-pay | Admitting: Internal Medicine

## 2021-08-01 ENCOUNTER — Other Ambulatory Visit: Payer: Self-pay | Admitting: Family Medicine

## 2021-08-01 DIAGNOSIS — N63 Unspecified lump in unspecified breast: Secondary | ICD-10-CM

## 2021-08-02 ENCOUNTER — Encounter (HOSPITAL_BASED_OUTPATIENT_CLINIC_OR_DEPARTMENT_OTHER): Payer: 59 | Admitting: Internal Medicine

## 2021-08-07 ENCOUNTER — Ambulatory Visit
Admission: RE | Admit: 2021-08-07 | Discharge: 2021-08-07 | Disposition: A | Discharge status: Home / Self Care | Payer: 59 | Source: Ambulatory Visit | Attending: *Deleted | Admitting: *Deleted

## 2021-08-07 ENCOUNTER — Other Ambulatory Visit: Payer: Self-pay | Admitting: Family Medicine

## 2021-08-07 ENCOUNTER — Other Ambulatory Visit: Payer: Self-pay | Admitting: Orthopaedic Surgery

## 2021-08-07 ENCOUNTER — Telehealth: Payer: Self-pay | Admitting: Orthopaedic Surgery

## 2021-08-07 ENCOUNTER — Ambulatory Visit (HOSPITAL_BASED_OUTPATIENT_CLINIC_OR_DEPARTMENT_OTHER): Payer: 59 | Admitting: Internal Medicine

## 2021-08-07 DIAGNOSIS — N63 Unspecified lump in unspecified breast: Secondary | ICD-10-CM

## 2021-08-07 DIAGNOSIS — N6320 Unspecified lump in the left breast, unspecified quadrant: Secondary | ICD-10-CM

## 2021-08-07 MED ORDER — TIZANIDINE HCL 4 MG PO TABS: ORAL_TABLET | ORAL | 0 refills | Status: DC

## 2021-08-07 NOTE — Telephone Encounter (Signed)
Patient called  needing Rx refilled Tizanidine. The number to contact patient is [phone] ?

## 2021-08-07 NOTE — Telephone Encounter (Signed)
Please advise 

## 2021-08-20 ENCOUNTER — Other Ambulatory Visit: Payer: Self-pay | Admitting: Internal Medicine

## 2021-08-20 DIAGNOSIS — R32 Unspecified urinary incontinence: Secondary | ICD-10-CM

## 2021-08-20 DIAGNOSIS — E785 Hyperlipidemia, unspecified: Secondary | ICD-10-CM

## 2021-08-20 DIAGNOSIS — F331 Major depressive disorder, recurrent, moderate: Secondary | ICD-10-CM

## 2021-08-20 DIAGNOSIS — I10 Essential (primary) hypertension: Secondary | ICD-10-CM

## 2021-08-23 ENCOUNTER — Other Ambulatory Visit: Payer: Self-pay | Admitting: *Deleted

## 2021-08-23 NOTE — Patient Outreach (Signed)
[facility]) Care Management  08/23/2021  Vanessa Palmer 09-29-1950 [id number]   RN Health Coach telephone call to patient.  Hipaa compliance verified. Per patient her blood pressure is controlled. She is taking her medications as per ordered She is monitoring her blood pressure at home.  Plan Case Closure Case closure letter sent to PCP Case closure letter sent to Patient Certificate of Completion sent to patient  Gean Maidens BSN RN [facility] Care Management [phone]

## 2021-09-07 ENCOUNTER — Encounter (HOSPITAL_BASED_OUTPATIENT_CLINIC_OR_DEPARTMENT_OTHER): Payer: Medicare Other | Attending: Internal Medicine | Admitting: Internal Medicine

## 2021-09-07 DIAGNOSIS — I890 Lymphedema, not elsewhere classified: Secondary | ICD-10-CM | POA: Insufficient documentation

## 2021-09-07 DIAGNOSIS — I130 Hypertensive heart and chronic kidney disease with heart failure and stage 1 through stage 4 chronic kidney disease, or unspecified chronic kidney disease: Secondary | ICD-10-CM | POA: Diagnosis not present

## 2021-09-07 DIAGNOSIS — N183 Chronic kidney disease, stage 3 unspecified: Secondary | ICD-10-CM | POA: Insufficient documentation

## 2021-09-07 DIAGNOSIS — I509 Heart failure, unspecified: Secondary | ICD-10-CM | POA: Diagnosis not present

## 2021-09-07 DIAGNOSIS — I87313 Chronic venous hypertension (idiopathic) with ulcer of bilateral lower extremity: Secondary | ICD-10-CM | POA: Insufficient documentation

## 2021-09-07 DIAGNOSIS — I872 Venous insufficiency (chronic) (peripheral): Secondary | ICD-10-CM | POA: Diagnosis not present

## 2021-09-07 DIAGNOSIS — L97812 Non-pressure chronic ulcer of other part of right lower leg with fat layer exposed: Secondary | ICD-10-CM | POA: Diagnosis present

## 2021-09-07 DIAGNOSIS — L97822 Non-pressure chronic ulcer of other part of left lower leg with fat layer exposed: Secondary | ICD-10-CM | POA: Insufficient documentation

## 2021-09-07 NOTE — Progress Notes (Signed)
AVNOOR, KOURY ([id number]) Visit Report for 09/07/2021 Chief Complaint Document Details Patient Name: Date of Service: JILLIAM, BELLMORE 09/07/2021 9:45 A M Medical Record Number: [id number] Patient Account Number: [id number] Date of Birth/Sex: Treating RN: 06-12-1950 (71 y.o. [name] Primary Care Provider: [name] Other Clinician: Referring Provider: Treating Provider/Extender: [name] in Treatment: 0 Information Obtained from: Patient Chief Complaint 09/07/2021; scattered open wounds to the lower extremities bilaterally Electronic Signature(s) Signed: 09/07/2021 1:46:38 PM By: Geralyn Corwin DO Entered By: Geralyn Corwin on 09/07/2021 13:27:44 -------------------------------------------------------------------------------- Debridement Details Patient Name: Date of Service: [name] 09/07/2021 9:45 A M Medical Record Number: [id number] Patient Account Number: [id number] Date of Birth/Sex: Treating RN: Sep 04, 1950 (70 y.o. [name], [name] Primary Care Provider: [name] Other Clinician: Referring Provider: Treating Provider/Extender: [name] in Treatment: 0 Debridement Performed for Assessment: Wound #1 Right,Posterior Lower Leg Performed By: Clinician Shawn Stall, RN Debridement Type: Chemical/Enzymatic/Mechanical Agent Used: gauze and wound cleanser Level of Consciousness (Pre-procedure): Awake and Alert Pre-procedure Verification/Time Out No Taken: Bleeding: None Response to Treatment: Procedure was tolerated well Level of Consciousness (Post- Awake and Alert procedure): Post Debridement Measurements of Total Wound Length: (cm) 14 Width: (cm) 9 Depth: (cm) 0.1 Volume: (cm) 9.896 Character of Wound/Ulcer Post Debridement: Stable Post Procedure Diagnosis Same as Pre-procedure Electronic Signature(s) Signed: 09/07/2021 1:46:38 PM By: Geralyn Corwin DO Signed: 09/07/2021  4:34:49 PM By: Shawn Stall RN, BSN Entered By: Shawn Stall on 09/07/2021 11:04:19 -------------------------------------------------------------------------------- Debridement Details Patient Name: Date of Service: [name] 09/07/2021 9:45 A M Medical Record Number: [id number] Patient Account Number: [id number] Date of Birth/Sex: Treating RN: Jan 14, 1951 (70 y.o. [name] Primary Care Provider: [name] Other Clinician: Referring Provider: Treating Provider/Extender: [name] in Treatment: 0 Debridement Performed for Assessment: Wound #2 Left,Lateral Lower Leg Performed By: Clinician Shawn Stall, RN Debridement Type: Chemical/Enzymatic/Mechanical Agent Used: gauze and wound cleanser Level of Consciousness (Pre-procedure): Awake and Alert Pre-procedure Verification/Time Out No Taken: Bleeding: None Response to Treatment: Procedure was tolerated well Level of Consciousness (Post- Awake and Alert procedure): Post Debridement Measurements of Total Wound Length: (cm) 1.5 Width: (cm) 2.5 Depth: (cm) 0.1 Volume: (cm) 0.295 Character of Wound/Ulcer Post Debridement: Stable Post Procedure Diagnosis Same as Pre-procedure Electronic Signature(s) Signed: 09/07/2021 1:46:38 PM By: Geralyn Corwin DO Signed: 09/07/2021 4:34:49 PM By: Shawn Stall RN, BSN Entered By: Shawn Stall on 09/07/2021 11:04:35 -------------------------------------------------------------------------------- HPI Details Patient Name: Date of Service: [name], [name] 09/07/2021 9:45 A M Medical Record Number: [id number] Patient Account Number: [id number] Date of Birth/Sex: Treating RN: 1951-02-13 (71 y.o. [name] Primary Care Provider: [name] Other Clinician: Referring Provider: Treating Provider/Extender: [name] in Treatment: 0 History of Present Illness HPI Description: Admission 09/07/2021 Ms. Cleotilde  Baranski is a 71 year old female with a past medical history of essential hypertension, chronic kidney disease stage III, and lymphedema that presents to the clinic for a 2-week history of scattered open wounds to her lower extremities bilaterally. She states this has been an ongoing issue for the past year. She states that the wounds wax and wane in healing. She currently takes torsemide. She does not wear compression stockings. She currently denies signs of infection. Electronic Signature(s) Signed: 09/07/2021 1:54:40 PM By: Geralyn Corwin DO Previous Signature: 09/07/2021 1:46:38 PM Version By: Geralyn Corwin DO Entered By: Geralyn Corwin on 09/07/2021 13:49:53 -------------------------------------------------------------------------------- Physical Exam Details Patient Name: Date of Service: [facility]  NS, Madelon D. 09/07/2021 9:45 A M Medical Record Number: [id number] Patient Account Number: [id number] Date of Birth/Sex: Treating RN: 1950-03-04 (71 y.o. Arta SilenceF) Deaton, Bobbi Primary Care Provider: Debe CoderMullen, Emily Other Clinician: Referring Provider: Treating Provider/Extender: Lauretta ChesterHoffman, Shanece Cochrane Mullen, Emily Weeks in Treatment: 0 Constitutional respirations regular, non-labored and within target range for patient.. Cardiovascular 2+ dorsalis pedis/posterior tibialis pulses. Psychiatric pleasant and cooperative. Notes Multiple scattered open wounds to the legs bilaterally with granulation tissue. No signs of surrounding infection. 3+ pitting edema to the knees. Electronic Signature(s) Signed: 09/07/2021 1:54:40 PM By: Geralyn CorwinHoffman, Nachum Derossett DO Entered By: Geralyn CorwinHoffman, Yanissa Michalsky on 09/07/2021 13:50:26 -------------------------------------------------------------------------------- Physician Orders Details Patient Name: Date of Service: Sherri RadSIMMO NS, Jahnia D. 09/07/2021 9:45 A M Medical Record Number: [id number] Patient Account Number: [id number] Date of Birth/Sex: Treating RN: 1950-03-04 47(70 y.o. Debara PickettF) Deaton,  Millard.LoaBobbi Primary Care Provider: Debe CoderMullen, Emily Other Clinician: Referring Provider: Treating Provider/Extender: Lauretta ChesterHoffman, Mamadou Breon Mullen, Emily Weeks in Treatment: 0 Verbal / Phone Orders: No Diagnosis Coding ICD-10 Coding Code Description [phone] Non-pressure chronic ulcer of other part of right lower leg with fat layer exposed L97.822 Non-pressure chronic ulcer of other part of left lower leg with fat layer exposed I89.0 Lymphedema, not elsewhere classified I87.313 Chronic venous hypertension (idiopathic) with ulcer of bilateral lower extremity Follow-up Appointments ppointment in 1 week. - Dr. Mikey BussingHoffman and [place], Room 8 09/14/2021 0900 Thursday Return A Other: - [place] [facility] you [facility] [employer] [facility] they may reach out for any copays. Bathing/ Shower/ Hygiene May shower with protection but do not get wound dressing(s) wet. Edema Control - Lymphedema / SCD / Other Elevate legs to the level of the heart or above for 30 minutes daily and/or when sitting, a frequency of: - 3-4 times a day throughout the day. Avoid standing for long periods of time. Exercise regularly Compression stocking or Garment 30-40 mm/Hg pressure to: - will order you [facility] [employer] they may reach out for any copays. Wound Treatment Wound #1 - Lower Leg Wound Laterality: Right, Posterior Cleanser: Soap and Water 1 x Per Week/30 Days Discharge Instructions: May shower and wash wound with dial antibacterial soap and water prior to dressing change. Peri-Wound Care: Sween Lotion (Moisturizing lotion) 1 x Per Week/30 Days Discharge Instructions: Apply moisturizing lotion as directed Topical: Gentamicin 1 x Per Week/30 Days Discharge Instructions: mix with mupirocin Topical: Mupirocin Ointment 1 x Per Week/30 Days Discharge Instructions: Apply Mupirocin (Bactroban) as instructed Prim Dressing: KerraCel Ag Gelling Fiber Dressing, 4x5 in (silver alginate) 1 x Per Week/30 Days ary Discharge  Instructions: Apply silver alginate to wound bed as instructed Secondary Dressing: ABD Pad, 8x10 1 x Per Week/30 Days Discharge Instructions: Apply over primary dressing as directed. Compression Wrap: ThreePress (3 layer compression wrap) 1 x Per Week/30 Days Discharge Instructions: Apply three layer compression as directed. Compression Stockings: Circaid Juxta Lite Compression Wrap (DME) Right Leg Compression Amount: 30-40 mmHG Discharge Instructions: Apply Circaid Juxta Lite Compression Wrap daily as instructed. Apply first thing in the morning, remove at night before bed. Wound #2 - Lower Leg Wound Laterality: Left, Lateral Cleanser: Soap and Water 1 x Per Week/30 Days Discharge Instructions: May shower and wash wound with dial antibacterial soap and water prior to dressing change. Peri-Wound Care: Sween Lotion (Moisturizing lotion) 1 x Per Week/30 Days Discharge Instructions: Apply moisturizing lotion as directed Topical: Gentamicin 1 x Per Week/30 Days Discharge Instructions: mix with mupirocin Topical: Mupirocin Ointment 1 x Per Week/30 Days Discharge Instructions: Apply Mupirocin (Bactroban) as instructed  Prim Dressing: KerraCel Ag Gelling Fiber Dressing, 4x5 in (silver alginate) 1 x Per Week/30 Days ary Discharge Instructions: Apply silver alginate to wound bed as instructed Secondary Dressing: ABD Pad, 8x10 1 x Per Week/30 Days Discharge Instructions: Apply over primary dressing as directed. Compression Wrap: ThreePress (3 layer compression wrap) 1 x Per Week/30 Days Discharge Instructions: Apply three layer compression as directed. Compression Stockings: Circaid Juxta Lite Compression Wrap (DME) Left Leg Compression Amount: 30-40 mmHG Discharge Instructions: Apply Circaid Juxta Lite Compression Wrap daily as instructed. Apply first thing in the morning, remove at night before bed. Electronic Signature(s) Signed: 09/07/2021 1:54:40 PM By: Kalman Shan DO Previous Signature:  09/07/2021 1:46:38 PM Version By: Kalman Shan DO Entered By: Kalman Shan on 09/07/2021 13:50:35 -------------------------------------------------------------------------------- Problem List Details Patient Name: Date of Service: Betsey Holiday, Lavonne Chick D. 09/07/2021 9:45 A M Medical Record Number: [id number] Patient Account Number: [id number] Date of Birth/Sex: Treating RN: [birth year] (71 y.o. [name] Primary Care Provider: Gilles Chiquito Other Clinician: Referring Provider: Treating Provider/Extender: Aurelio Jew in Treatment: 0 Active Problems ICD-10 Encounter Code Description Active Date MDM Diagnosis [phone] Non-pressure chronic ulcer of other part of right lower leg with fat layer 09/07/2021 No Yes exposed L97.822 Non-pressure chronic ulcer of other part of left lower leg with fat layer exposed7/13/2023 No Yes I89.0 Lymphedema, not elsewhere classified 09/07/2021 No Yes I87.313 Chronic venous hypertension (idiopathic) with ulcer of bilateral lower extremity 09/07/2021 No Yes Inactive Problems Resolved Problems Electronic Signature(s) Signed: 09/07/2021 1:46:38 PM By: Kalman Shan DO Entered By: Kalman Shan on 09/07/2021 13:27:24 -------------------------------------------------------------------------------- Progress Note Details Patient Name: Date of Service: [name] 09/07/2021 9:45 A M Medical Record Number: [id number] Patient Account Number: [id number] Date of Birth/Sex: Treating RN: [birth year] (71 y.o. [name] Primary Care Provider: Gilles Chiquito Other Clinician: Referring Provider: Treating Provider/Extender: Aurelio Jew in Treatment: 0 Subjective Chief Complaint Information obtained from Patient 09/07/2021; scattered open wounds to the lower extremities bilaterally History of Present Illness (HPI) Admission 09/07/2021 Ms. Rajae Moehle is a 71 year old female with a past medical  history of essential hypertension, chronic kidney disease stage III, and lymphedema that presents to the clinic for a 2-week history of scattered open wounds to her lower extremities bilaterally. She states this has been an ongoing issue for the past year. She states that the wounds wax and wane in healing. She currently takes torsemide. She does not wear compression stockings. She currently denies signs of infection. Patient History Information obtained from Chart. Allergies ACE Inhibitors (Reaction: angioedema), regadenoson (Reaction: hives), Lexiscan, Shellfish Containing Products (Reaction: anaphylaxis), peanut oil (Reaction: itching, rash, welts), hydrocodone (Reaction: itching), hydromorphone (Reaction: rash), oxycodone (Reaction: itching- tolerates with benadryl), propoxyphene (Reaction: itching), Vicodin (Reaction: itching) Family History Diabetes - Siblings, Heart Disease - Father,Siblings. Social History Never smoker, Alcohol Use - Rarely - sober since 2006. Medical History Hematologic/Lymphatic Patient has history of Anemia Respiratory Patient has history of Asthma, Sleep Apnea - 2018- oCPAP Cardiovascular Patient has history of Congestive Heart Failure, Coronary Artery Disease, Deep Vein Thrombosis - 2014 both legs, Hypertension, Myocardial Infarction - 2005 Musculoskeletal Patient has history of Gout, Osteoarthritis Neurologic Patient has history of Seizure Disorder - as a teenager Hospitalization/Surgery History - heart cath 2018, 2003, 2004, 2006. - right total hip replacement 2016. - hysterectomy. Medical A Surgical History Notes nd Constitutional Symptoms (General Health) chronic back pain Migraine last 2018 Cardiovascular hyperlipidemia Gastrointestinal GERD Genitourinary stage III CKD Musculoskeletal DJD Psychiatric depression Objective  Constitutional respirations regular, non-labored and within target range for patient.. Vitals Time Taken: 10:25 AM,  Height: 65 in, Source: Stated, Weight: 274 lbs, Source: Stated, BMI: 45.6, Temperature: 98.4 F, Pulse: 77 bpm, Respiratory Rate: 20 breaths/min, Blood Pressure: 164/99 mmHg. Cardiovascular 2+ dorsalis pedis/posterior tibialis pulses. Psychiatric pleasant and cooperative. General Notes: Multiple scattered open wounds to the legs bilaterally with granulation tissue. No signs of surrounding infection. 3+ pitting edema to the knees. Integumentary (Hair, Skin) Wound #1 status is Open. Original cause of wound was Blister. The date acquired was: 09/01/2021. The wound is located on the Right,Posterior Lower Leg. The wound measures 14cm length x 9cm width x 0.1cm depth; 98.96cm^2 area and 9.896cm^3 volume. There is Fat Layer (Subcutaneous Tissue) exposed. There is no tunneling or undermining noted. There is a medium amount of serosanguineous drainage noted. The wound margin is distinct with the outline attached to the wound base. There is large (67-100%) red, pink granulation within the wound bed. There is a small (1-33%) amount of necrotic tissue within the wound bed including Adherent Slough. Wound #2 status is Open. Original cause of wound was Blister. The date acquired was: 09/01/2021. The wound is located on the Left,Lateral Lower Leg. The wound measures 1.5cm length x 2.5cm width x 0.1cm depth; 2.945cm^2 area and 0.295cm^3 volume. There is Fat Layer (Subcutaneous Tissue) exposed. There is no tunneling or undermining noted. There is a medium amount of serosanguineous drainage noted. The wound margin is distinct with the outline attached to the wound base. There is large (67-100%) red granulation within the wound bed. There is no necrotic tissue within the wound bed. Assessment Active Problems ICD-10 Non-pressure chronic ulcer of other part of right lower leg with fat layer exposed Non-pressure chronic ulcer of other part of left lower leg with fat layer exposed Lymphedema, not elsewhere  classified Chronic venous hypertension (idiopathic) with ulcer of bilateral lower extremity Patient has a history of venous insufficiency/lymphedema. She often develops small wounds that wax and wane in healing. Her current wounds started 2 weeks ago. Her ABIs in office were 1.05 on the left and 1.16 on the right. She has strong pedal pulses. She has adequate blood flow for healing. I recommended silver alginate under compression wraps. She knows she cannot keep the wrap on for more than 7 days and cannot get this wet. Since she does not wear compression stockings daily I recommended obtaining these. We will order juxta lites for her. Follow-up in 1 week 46 minutes was spent on the encounter including face-to-face, EMR review and coordination of care Procedures Wound #1 Pre-procedure diagnosis of Wound #1 is a Lymphedema located on the Right,Posterior Lower Leg . There was a Chemical/Enzymatic/Mechanical debridement performed by Shawn Stall, RN.. Other agent used was gauze and wound cleanser. There was no bleeding. The procedure was tolerated well. Post Debridement Measurements: 14cm length x 9cm width x 0.1cm depth; 9.896cm^3 volume. Character of Wound/Ulcer Post Debridement is stable. Post procedure Diagnosis Wound #1: Same as Pre-Procedure Pre-procedure diagnosis of Wound #1 is a Lymphedema located on the Right,Posterior Lower Leg . There was a Three Layer Compression Therapy Procedure by Shawn Stall, RN. Post procedure Diagnosis Wound #1: Same as Pre-Procedure Wound #2 Pre-procedure diagnosis of Wound #2 is a Lymphedema located on the Left,Lateral Lower Leg . There was a Chemical/Enzymatic/Mechanical debridement performed by Shawn Stall, RN.. Other agent used was gauze and wound cleanser. There was no bleeding. The procedure was tolerated well. Post Debridement Measurements: 1.5cm length x 2.5cm width  x 0.1cm depth; 0.295cm^3 volume. Character of Wound/Ulcer Post Debridement is  stable. Post procedure Diagnosis Wound #2: Same as Pre-Procedure Pre-procedure diagnosis of Wound #2 is a Lymphedema located on the Left,Lateral Lower Leg . There was a Three Layer Compression Therapy Procedure by Deon Pilling, RN. Post procedure Diagnosis Wound #2: Same as Pre-Procedure Plan Follow-up Appointments: Return Appointment in 1 week. - Dr. Heber [place] and [place], Room 8 09/14/2021 0900 Thursday Other: - will order you juxtalite HD from [place] they may reach out for any copays. Bathing/ Shower/ Hygiene: May shower with protection but do not get wound dressing(s) wet. Edema Control - Lymphedema / SCD / Other: Elevate legs to the level of the heart or above for 30 minutes daily and/or when sitting, a frequency of: - 3-4 times a day throughout the day. Avoid standing for long periods of time. Exercise regularly Compression stocking or Garment 30-40 mm/Hg pressure to: - will order you juxtalite HD from [place] they may reach out for any copays. WOUND #1: - Lower Leg Wound Laterality: Right, Posterior Cleanser: Soap and Water 1 x Per Week/30 Days Discharge Instructions: May shower and wash wound with dial antibacterial soap and water prior to dressing change. Peri-Wound Care: Sween Lotion (Moisturizing lotion) 1 x Per Week/30 Days Discharge Instructions: Apply moisturizing lotion as directed Topical: Gentamicin 1 x Per Week/30 Days Discharge Instructions: mix with mupirocin Topical: Mupirocin Ointment 1 x Per Week/30 Days Discharge Instructions: Apply Mupirocin (Bactroban) as instructed Prim Dressing: KerraCel Ag Gelling Fiber Dressing, 4x5 in (silver alginate) 1 x Per Week/30 Days ary Discharge Instructions: Apply silver alginate to wound bed as instructed Secondary Dressing: ABD Pad, 8x10 1 x Per Week/30 Days Discharge Instructions: Apply over primary dressing as directed. Com pression Wrap: ThreePress (3 layer compression wrap) 1 x Per Week/30 Days Discharge  Instructions: Apply three layer compression as directed. Com pression Stockings: Circaid Juxta Lite Compression Wrap (DME) Compression Amount: 30-40 mmHg (right) Discharge Instructions: Apply Circaid Juxta Lite Compression Wrap daily as instructed. Apply first thing in the morning, remove at night before bed. WOUND #2: - Lower Leg Wound Laterality: Left, Lateral Cleanser: Soap and Water 1 x Per Week/30 Days Discharge Instructions: May shower and wash wound with dial antibacterial soap and water prior to dressing change. Peri-Wound Care: Sween Lotion (Moisturizing lotion) 1 x Per Week/30 Days Discharge Instructions: Apply moisturizing lotion as directed Topical: Gentamicin 1 x Per Week/30 Days Discharge Instructions: mix with mupirocin Topical: Mupirocin Ointment 1 x Per Week/30 Days Discharge Instructions: Apply Mupirocin (Bactroban) as instructed Prim Dressing: KerraCel Ag Gelling Fiber Dressing, 4x5 in (silver alginate) 1 x Per Week/30 Days ary Discharge Instructions: Apply silver alginate to wound bed as instructed Secondary Dressing: ABD Pad, 8x10 1 x Per Week/30 Days Discharge Instructions: Apply over primary dressing as directed. Com pression Wrap: ThreePress (3 layer compression wrap) 1 x Per Week/30 Days Discharge Instructions: Apply three layer compression as directed. Com pression Stockings: Circaid Juxta Lite Compression Wrap (DME) Compression Amount: 30-40 mmHg (left) Discharge Instructions: Apply Circaid Juxta Lite Compression Wrap daily as instructed. Apply first thing in the morning, remove at night before bed. 1. Silver alginate under 3 layer compression 2. Order juxta lite compression 3. Follow-up in 1 week Electronic Signature(s) Signed: 09/07/2021 1:54:40 PM By: Kalman Shan DO Entered By: Kalman Shan on 09/07/2021 13:54:00 -------------------------------------------------------------------------------- HxROS Details Patient Name: Date of Service: Betsey Holiday, Shealynn D. 09/07/2021 9:45 A M Medical Record Number: [id number] Patient Account Number: [id number] Date of  Birth/Sex: Treating RN: 10-Jul-1950 (71 y.o. [name], Tammi Klippel Primary Care Provider: Gilles Chiquito Other Clinician: Referring Provider: Treating Provider/Extender: Aurelio Jew in Treatment: 0 Information Obtained From Chart Constitutional Symptoms (General Health) Medical History: Past Medical History Notes: chronic back pain Migraine last 2018 Hematologic/Lymphatic Medical History: Positive for: Anemia Respiratory Medical History: Positive for: Asthma; Sleep Apnea - 2018- oCPAP Cardiovascular Medical History: Positive for: Congestive Heart Failure; Coronary Artery Disease; Deep Vein Thrombosis - 2014 both legs; Hypertension; Myocardial Infarction - 2005 Past Medical History Notes: hyperlipidemia Gastrointestinal Medical History: Past Medical History Notes: GERD Genitourinary Medical History: Past Medical History Notes: stage III CKD Musculoskeletal Medical History: Positive for: Gout; Osteoarthritis Past Medical History Notes: DJD Neurologic Medical History: Positive for: Seizure Disorder - as a teenager Psychiatric Medical History: Past Medical History Notes: depression Immunizations Pneumococcal Vaccine: Received Pneumococcal Vaccination: No Implantable Devices No devices added Hospitalization / Surgery History Type of Hospitalization/Surgery heart cath 2018, 2003, 2004, 2006 right total hip replacement 2016 hysterectomy Family and Social History Diabetes: Yes - Siblings; Heart Disease: Yes - Father,Siblings; Never smoker; Alcohol Use: Rarely - sober since 2006 Electronic Signature(s) Signed: 09/07/2021 1:46:38 PM By: Kalman Shan DO Signed: 09/07/2021 4:34:49 PM By: Deon Pilling RN, BSN Entered By: Deon Pilling on 09/06/2021  17:28:52 -------------------------------------------------------------------------------- SuperBill Details Patient Name: Date of Service: Betsey Holiday, Cardelia D. 09/07/2021 Medical Record Number: [id number] Patient Account Number: [id number] Date of Birth/Sex: Treating RN: 08-Apr-1950 (71 y.o. [name], Meta.Reding Primary Care Provider: Gilles Chiquito Other Clinician: Referring Provider: Treating Provider/Extender: Aurelio Jew in Treatment: 0 Diagnosis Coding ICD-10 Codes Code Description [phone] Non-pressure chronic ulcer of other part of right lower leg with fat layer exposed L97.822 Non-pressure chronic ulcer of other part of left lower leg with fat layer exposed I89.0 Lymphedema, not elsewhere classified I87.313 Chronic venous hypertension (idiopathic) with ulcer of bilateral lower extremity Facility Procedures CPT4 Code: [id number] Description: [place] VISIT-LEV 3 EST PT Modifier: Quantity: 1 CPT4 Code: [id number] Description: [id number] - DEBRIDE W/O ANES NON SELECT Modifier: Quantity: 1 Physician Procedures Electronic Signature(s) Signed: 09/07/2021 1:54:40 PM By: Kalman Shan DO Previous Signature: 09/07/2021 1:46:38 PM Version By: Kalman Shan DO Entered By: Kalman Shan on 09/07/2021 13:54:12

## 2021-09-07 NOTE — Progress Notes (Signed)
Vanessa Palmer ([id number]) Visit Report for 09/07/2021 Abuse Risk Screen Details Patient Name: Date of Service: Vanessa Palmer 09/07/2021 9:45 A Vanessa Palmer: [id number] Patient Account Palmer: [id number] Date of Birth/Sex: Treating RN: 06-11-1950 (72 y.o. Vanessa Palmer, Vanessa Palmer Primary Care Vanessa Palmer: Vanessa Palmer Other Clinician: Referring Vanessa Palmer: Treating Vanessa Palmer/Extender: Vanessa Palmer in Treatment: 0 Abuse Risk Screen Items Answer ABUSE RISK SCREEN: Has anyone close to you tried to hurt or harm you recentlyo No Do you feel uncomfortable with anyone in your familyo No Has anyone forced you do things that you didnt want to doo No Electronic Signature(s) Signed: 09/07/2021 4:34:49 PM By: Vanessa Stall RN, BSN Entered By: Vanessa Palmer on 09/07/2021 10:30:03 -------------------------------------------------------------------------------- Activities of Daily Living Details Patient Name: Date of Service: Vanessa Palmer 09/07/2021 9:45 A Vanessa Palmer: [id number] Patient Account Palmer: [id number] Date of Birth/Sex: Treating RN: 03-May-1950 (71 y.o. Vanessa Palmer, Vanessa Palmer Primary Care Gavriella Hearst: Vanessa Palmer Other Clinician: Referring Vanessa Palmer: Treating Vanessa Palmer/Extender: Vanessa Palmer in Treatment: 0 Activities of Daily Living Items Answer Activities of Daily Living (Please select one for each item) Drive Automobile Not Able T Medications ake Completely Able Use T elephone Completely Able Care for Appearance Need Assistance Use T oilet Need Assistance Bath / Shower Need Assistance Dress Self Need Assistance Feed Self Completely Able Walk Need Assistance Get In / Out Bed Need Assistance Housework Need Assistance Prepare Meals Need Assistance Handle Money Need Assistance Shop for Self Need Assistance Electronic Signature(s) Signed: 09/07/2021 4:34:49 PM By: Vanessa Stall RN, BSN Entered By: Vanessa Palmer  on 09/07/2021 10:30:33 -------------------------------------------------------------------------------- Education Screening Details Patient Name: Date of Service: Vanessa Rad D. 09/07/2021 9:45 A Vanessa Palmer: [id number] Patient Account Palmer: [id number] Date of Birth/Sex: Treating RN: 03-02-1950 (71 y.o. Vanessa Palmer, Vanessa Palmer Primary Care Vanessa Palmer: Vanessa Palmer Other Clinician: Referring Vanessa Palmer: Treating Vanessa Palmer/Extender: Vanessa Palmer in Treatment: 0 Primary Learner Assessed: Patient Learning Preferences/Education Level/Primary Language Learning Preference: Explanation, Demonstration, Printed Material Highest Education Level: High School Preferred Language: English Cognitive Barrier Language Barrier: No Translator Needed: No Memory Deficit: No Emotional Barrier: No Cultural/Religious Beliefs Affecting Medical Care: No Physical Barrier Impaired Vision: Yes Glasses Impaired Hearing: No Decreased Hand dexterity: No Knowledge/Comprehension Knowledge Level: High Comprehension Level: High Ability to understand written instructions: High Ability to understand verbal instructions: High Motivation Anxiety Level: Calm Cooperation: Cooperative Education Importance: Acknowledges Need Interest in Health Problems: Asks Questions Perception: Coherent Willingness to Engage in Self-Management High Activities: Readiness to Engage in Self-Management High Activities: Electronic Signature(s) Signed: 09/07/2021 4:34:49 PM By: Vanessa Stall RN, BSN Entered By: Vanessa Palmer on 09/07/2021 10:30:59 -------------------------------------------------------------------------------- Fall Risk Assessment Details Patient Name: Date of Service: Vanessa Palmer, [name] 09/07/2021 9:45 A Vanessa Palmer: [id number] Patient Account Palmer: [id number] Date of Birth/Sex: Treating RN: 1950-03-04 (71 y.o. Vanessa Palmer, Vanessa Palmer Primary Care Vanessa Palmer: Vanessa Palmer Other Clinician: Referring Vanessa Palmer: Treating Vanessa Palmer/Extender: Vanessa Palmer in Treatment: 0 Fall Risk Assessment Items Have you had 2 or more falls in the last 12 monthso 0 Yes Have you had any fall that resulted in injury in the last 12 monthso 0 No FALLS RISK SCREEN History of falling - immediate or within 3 months 0 No Secondary diagnosis (Do you have 2 or more medical diagnoseso) 0 No Ambulatory aid None/bed rest/wheelchair/nurse 0 Yes Crutches/cane/walker 15 Yes Furniture 0 No Intravenous therapy Access/Saline/Heparin Lock 0 No Gait/Transferring Normal/ bed rest/ wheelchair 0  No Weak (short steps with or without shuffle, stooped but able to lift head while walking, may seek 10 Yes support from furniture) Impaired (short steps with shuffle, may have difficulty arising from chair, head down, impaired 0 No balance) Mental Status Oriented to own ability 0 Yes Electronic Signature(s) Signed: 09/07/2021 4:34:49 PM By: Vanessa Stall RN, BSN Entered By: Vanessa Palmer on 09/07/2021 10:31:15 -------------------------------------------------------------------------------- Foot Assessment Details Patient Name: Date of Service: Vanessa Rad D. 09/07/2021 9:45 A Vanessa Palmer: [id number] Patient Account Palmer: [id number] Date of Birth/Sex: Treating RN: 1950-06-18 (71 y.o. Vanessa Palmer Primary Care Vanessa Palmer: Vanessa Palmer Other Clinician: Referring Vanessa Palmer: Treating Vanessa Palmer/Extender: Vanessa Palmer in Treatment: 0 Foot Assessment Items Site Locations + = Sensation present, - = Sensation absent, C = Callus, U = Ulcer R = Redness, W = Warmth, Vanessa = Maceration, PU = Pre-ulcerative lesion F = Fissure, S = Swelling, D = Dryness Assessment Right: Left: Other Deformity: No No Prior Foot Ulcer: No No Prior Amputation: No No Charcot Joint: No No Ambulatory Status: Ambulatory With Help Assistance Device:  Wheelchair Gait: Surveyor, mining) Signed: 09/07/2021 4:34:49 PM By: Vanessa Stall RN, BSN Entered By: Vanessa Palmer on 09/07/2021 10:38:22 -------------------------------------------------------------------------------- Nutrition Risk Screening Details Patient Name: Date of Service: Vanessa Rad D. 09/07/2021 9:45 A Vanessa Palmer: [id number] Patient Account Palmer: [id number] Date of Birth/Sex: Treating RN: [birth year] (71 y.o. Vanessa Palmer, Millard.Loa Primary Care Frenchie Dangerfield: Vanessa Palmer Other Clinician: Referring Lucille Witts: Treating Renea Schoonmaker/Extender: Vanessa Palmer in Treatment: 0 Height (in): 65 Weight (lbs): 274 Body Mass Index (BMI): 45.6 Nutrition Risk Screening Items Score Screening NUTRITION RISK SCREEN: I have an illness or condition that made me change the kind and/or amount of food I eat 2 Yes I eat fewer than two meals per day 0 No I eat few fruits and vegetables, or milk products 0 No I have three or more drinks of beer, liquor or wine almost every day 0 No I have tooth or mouth problems that make it hard for me to eat 0 No I don't always have enough money to buy the food I need 0 No I eat alone most of the time 0 No I take three or more different prescribed or over-the-counter drugs a day 1 Yes Without wanting to, I have lost or gained 10 pounds in the last six months 0 No I am not always physically able to shop, cook and/or feed myself 0 No Nutrition Protocols Good Risk Protocol Moderate Risk Protocol 0 Provide education on nutrition High Risk Proctocol Risk Level: Moderate Risk Score: 3 Electronic Signature(s) Signed: 09/07/2021 4:34:49 PM By: Vanessa Stall RN, BSN Entered By: Vanessa Palmer on 09/07/2021 10:31:31

## 2021-09-07 NOTE — Progress Notes (Signed)
Vanessa Palmer, Vanessa Palmer ([id number]) Visit Report for 09/07/2021 Allergy List Details Patient Name: Date of Service: Vanessa Palmer, Vanessa Palmer 09/07/2021 9:45 A M Medical Record Number: [id number] Patient Account Number: [id number] Date of Birth/Sex: Treating RN: August 21, 1950 (71 y.o. Vanessa Palmer Primary Care Mack Alvidrez: Gilles Chiquito Other Clinician: Referring Vanessa Cass: Treating Edythe Riches/Extender: Aurelio Jew in Treatment: 0 Allergies Active Allergies ACE Inhibitors Reaction: angioedema regadenoson Reaction: hives Lexiscan Shellfish Containing Products Reaction: anaphylaxis peanut oil Reaction: itching, rash, welts hydrocodone Reaction: itching hydromorphone Reaction: rash oxycodone Reaction: itching- tolerates with benadryl propoxyphene Reaction: itching Vicodin Reaction: itching Allergy Notes Electronic Signature(s) Signed: 09/07/2021 4:34:49 PM By: Deon Pilling RN, BSN Entered By: Deon Pilling on 09/06/2021 17:23:36 -------------------------------------------------------------------------------- Arrival Information Details Patient Name: Date of Service: Vanessa Small D. 09/07/2021 9:45 A M Medical Record Number: [id number] Patient Account Number: [id number] Date of Birth/Sex: Treating RN: March 18, 1950 (70 y.o. Vanessa Palmer Primary Care Vergia Chea: Gilles Chiquito Other Clinician: Referring Aamiyah Derrick: Treating Ozetta Flatley/Extender: Aurelio Jew in Treatment: 0 Visit Information Patient Arrived: Wheel Chair Arrival Time: 10:25 Accompanied By: daughter Transfer Assistance: Manual Patient Identification Verified: Yes Secondary Verification Process Completed: Yes Patient Requires Transmission-Based Precautions: No Patient Has Alerts: Yes Patient Alerts: Patient on Blood Thinner Electronic Signature(s) Signed: 09/07/2021 4:34:49 PM By: Deon Pilling RN, BSN Entered By: Deon Pilling on 09/07/2021  10:29:15 -------------------------------------------------------------------------------- Clinic Level of Care Assessment Details Patient Name: Date of Service: [facility] D. 09/07/2021 9:45 A M Medical Record Number: [id number] Patient Account Number: [id number] Date of Birth/Sex: Treating RN: 1950-09-29 (71 y.o. Vanessa Palmer Primary Care Rieley Hausman: Gilles Chiquito Other Clinician: Referring Angelgabriel Willmore: Treating Hutton Pellicane/Extender: Aurelio Jew in Treatment: 0 Clinic Level of Care Assessment Items TOOL 1 Quantity Score X- 1 0 Use when EandM and Procedure is performed on INITIAL visit ASSESSMENTS - Nursing Assessment / Reassessment X- 1 20 General Physical Exam (combine w/ comprehensive assessment (listed just below) when performed on new pt. evals) X- 1 25 Comprehensive Assessment (HX, ROS, Risk Assessments, Wounds Hx, etc.) ASSESSMENTS - Wound and Skin Assessment / Reassessment X- 1 10 Dermatologic / Skin Assessment (not related to wound area) ASSESSMENTS - Ostomy and/or Continence Assessment and Care []  - 0 Incontinence Assessment and Management []  - 0 Ostomy Care Assessment and Management (repouching, etc.) PROCESS - Coordination of Care []  - 0 Simple Patient / Family Education for ongoing care X- 1 20 Complex (extensive) Patient / Family Education for ongoing care X- 1 10 Staff obtains Programmer, systems, Records, T Results / Process Orders est []  - 0 Staff telephones HHA, Nursing Homes / Clarify orders / etc []  - 0 Routine Transfer to another Facility (non-emergent condition) []  - 0 Routine Hospital Admission (non-emergent condition) X- 1 15 New Admissions / Biomedical engineer / Ordering NPWT Apligraf, etc. , []  - 0 Emergency Hospital Admission (emergent condition) PROCESS - Special Needs []  - 0 Pediatric / Minor Patient Management []  - 0 Isolation Patient Management []  - 0 Hearing / Language / Visual special needs []  - 0 Assessment  of Community assistance (transportation, D/C planning, etc.) []  - 0 Additional assistance / Altered mentation []  - 0 Support Surface(s) Assessment (bed, cushion, seat, etc.) INTERVENTIONS - Miscellaneous []  - 0 External ear exam []  - 0 Patient Transfer (multiple staff / Civil Service fast streamer / Similar devices) []  - 0 Simple Staple / Suture removal (25 or less) []  - 0 Complex Staple / Suture removal (26 or more) []  - 0 Hypo/Hyperglycemic Management (do  not check if billed separately) X- 1 15 Ankle / Brachial Index (ABI) - do not check if billed separately Has the patient been seen at the hospital within the last three years: Yes Total Score: 115 Level Of Care: New/Established - Level 3 Electronic Signature(s) Signed: 09/07/2021 4:34:49 PM By: Deon Pilling RN, BSN Entered By: Deon Pilling on 09/07/2021 12:01:50 -------------------------------------------------------------------------------- Compression Therapy Details Patient Name: Date of Service: Vanessa Small D. 09/07/2021 9:45 A M Medical Record Number: [id number] Patient Account Number: [id number] Date of Birth/Sex: Treating RN: 1950/05/01 (71 y.o. Vanessa Palmer Primary Care Lisl Slingerland: Gilles Chiquito Other Clinician: Referring Athalee Esterline: Treating Zakry Caso/Extender: Aurelio Jew in Treatment: 0 Compression Therapy Performed for Wound Assessment: Wound #1 Right,Posterior Lower Leg Performed By: Clinician Deon Pilling, RN Compression Type: Three Layer Post Procedure Diagnosis Same as Pre-procedure Electronic Signature(s) Signed: 09/07/2021 4:34:49 PM By: Deon Pilling RN, BSN Entered By: Deon Pilling on 09/07/2021 11:04:58 -------------------------------------------------------------------------------- Compression Therapy Details Patient Name: Date of Service: Vanessa Small D. 09/07/2021 9:45 A M Medical Record Number: [id number] Patient Account Number: [id number] Date of Birth/Sex: Treating  RN: 1950-08-03 (71 y.o. Vanessa Palmer Primary Care Vernel Donlan: Gilles Chiquito Other Clinician: Referring Alyona Romack: Treating Alitzel Cookson/Extender: Aurelio Jew in Treatment: 0 Compression Therapy Performed for Wound Assessment: Wound #2 Left,Lateral Lower Leg Performed By: Clinician Deon Pilling, RN Compression Type: Three Layer Post Procedure Diagnosis Same as Pre-procedure Electronic Signature(s) Signed: 09/07/2021 4:34:49 PM By: Deon Pilling RN, BSN Signed: 09/07/2021 4:34:49 PM By: Deon Pilling RN, BSN Entered By: Deon Pilling on 09/07/2021 11:04:58 -------------------------------------------------------------------------------- Encounter Discharge Information Details Patient Name: Date of Service: Vanessa Small D. 09/07/2021 9:45 A M Medical Record Number: [id number] Patient Account Number: [id number] Date of Birth/Sex: Treating RN: [birth year] (71 y.o. [name], Tammi Klippel Primary Care Thurl Boen: Gilles Chiquito Other Clinician: Referring Jalessa Peyser: Treating Leahann Lempke/Extender: Aurelio Jew in Treatment: 0 Encounter Discharge Information Items Post Procedure Vitals Discharge Condition: Stable Temperature (F): 98.4 Ambulatory Status: Wheelchair Pulse (bpm): 77 Discharge Destination: Home Respiratory Rate (breaths/min): 20 Transportation: Private Auto Blood Pressure (mmHg): 164/99 Accompanied By: daughter Schedule Follow-up Appointment: Yes Clinical Summary of Care: Electronic Signature(s) Signed: 09/07/2021 4:34:49 PM By: Deon Pilling RN, BSN Entered By: Deon Pilling on 09/07/2021 12:03:46 -------------------------------------------------------------------------------- Lower Extremity Assessment Details Patient Name: Date of Service: Vanessa Small D. 09/07/2021 9:45 A M Medical Record Number: [id number] Patient Account Number: [id number] Date of Birth/Sex: Treating RN: [birth year] (71 y.o. [name], Tammi Klippel Primary Care  Raley Novicki: Gilles Chiquito Other Clinician: Referring Delaynee Alred: Treating Danyka Merlin/Extender: Jana Half Weeks in Treatment: 0 Edema Assessment Assessed: Vanessa Palmer: Yes] [name]: Yes] Edema: [Left: Yes] [Right: Yes] Calf Left: Right: Point of Measurement: 33 cm From Medial Instep 54 cm 53.5 cm Ankle Left: Right: Point of Measurement: 9 cm From Medial Instep 27 cm 28 cm Knee To Floor Left: Right: From Medial Instep 39 cm 39 cm Vascular Assessment Pulses: Dorsalis Pedis Palpable: [Left:Yes] [Right:Yes] Doppler Audible: [Left:Yes] [Right:Yes] Posterior Tibial Palpable: [Left:Yes] Doppler Audible: [Left:Yes] Blood Pressure: Brachial: [Right:164] Ankle: [Right:Dorsalis Pedis: 172 1.05] Electronic Signature(s) Signed: 09/07/2021 4:34:49 PM By: Deon Pilling RN, BSN Entered By: Deon Pilling on 09/07/2021 10:50:55 -------------------------------------------------------------------------------- Multi Wound Chart Details Patient Name: Date of Service: Vanessa Small D. 09/07/2021 9:45 A M Medical Record Number: [id number] Patient Account Number: [id number] Date of Birth/Sex: Treating RN: Nov 11, 1950 (71 y.o. [name], Tammi Klippel Primary Care Myka Lukins: Gilles Chiquito Other Clinician: Referring Marvina Danner: Treating Burnetta Kohls/Extender: Aurelio Jew  in Treatment: 0 Vital Signs Height(in): 65 Pulse(bpm): 66 Weight(lbs): 274 Blood Pressure(mmHg): 164/99 Body Mass Index(BMI): 45.6 Temperature(F): 98.4 Respiratory Rate(breaths/min): 20 Photos: [N/A:N/A] Right, Posterior Lower Leg Left, Lateral Lower Leg N/A Wound Location: Blister Blister N/A Wounding Event: Lymphedema Lymphedema N/A Primary Etiology: Anemia, Asthma, Sleep Apnea, Anemia, Asthma, Sleep Apnea, N/A Comorbid History: Congestive Heart Failure, Coronary Congestive Heart Failure, Coronary Artery Disease, Deep Vein Artery Disease, Deep Vein Thrombosis, Hypertension, Myocardial  Thrombosis, Hypertension, Myocardial Infarction, Gout, Osteoarthritis, SeizureInfarction, Gout, Osteoarthritis, Seizure Disorder Disorder 09/01/2021 09/01/2021 N/A Date Acquired: 0 0 N/A Weeks of Treatment: Open Open N/A Wound Status: No No N/A Wound Recurrence: Yes Yes N/A Clustered Wound: 5 4 N/A Clustered Quantity: 14x9x0.1 1.5x2.5x0.1 N/A Measurements L x W x D (cm) 98.96 2.945 N/A A (cm) : rea 9.896 0.295 N/A Volume (cm) : 0.00% 0.00% N/A % Reduction in Area: 0.00% 0.00% N/A % Reduction in Volume: Full Thickness Without Exposed Full Thickness Without Exposed N/A Classification: Support Structures Support Structures Medium Medium N/A Exudate Amount: Serosanguineous Serosanguineous N/A Exudate Type: red, brown red, brown N/A Exudate Color: Distinct, outline attached Distinct, outline attached N/A Wound Margin: Large (67-100%) Large (67-100%) N/A Granulation Amount: Red, Pink Red N/A Granulation Quality: Small (1-33%) None Present (0%) N/A Necrotic Amount: Fat Layer (Subcutaneous Tissue): Yes Fat Layer (Subcutaneous Tissue): Yes N/A Exposed Structures: Fascia: No Fascia: No Tendon: No Tendon: No Muscle: No Muscle: No Joint: No Joint: No Bone: No Bone: No Small (1-33%) Small (1-33%) N/A Epithelialization: Chemical/Enzymatic/Mechanical Chemical/Enzymatic/Mechanical N/A Debridement: N/A N/A N/A Instrument: None None N/A Bleeding: Debridement Treatment Response: Procedure was tolerated well Procedure was tolerated well N/A Post Debridement Measurements L x 14x9x0.1 1.5x2.5x0.1 N/A W x D (cm) 9.896 0.295 N/A Post Debridement Volume: (cm) Compression Therapy Compression Therapy N/A Procedures Performed: Debridement Debridement Treatment Notes Wound #1 (Lower Leg) Wound Laterality: Right, Posterior Cleanser Soap and Water Discharge Instruction: May shower and wash wound with dial antibacterial soap and water prior to dressing change. Peri-Wound  Care Sween Lotion (Moisturizing lotion) Discharge Instruction: Apply moisturizing lotion as directed Topical Gentamicin Discharge Instruction: mix with mupirocin Mupirocin Ointment Discharge Instruction: Apply Mupirocin (Bactroban) as instructed Primary Dressing KerraCel Ag Gelling Fiber Dressing, 4x5 in (silver alginate) Discharge Instruction: Apply silver alginate to wound bed as instructed Secondary Dressing ABD Pad, 8x10 Discharge Instruction: Apply over primary dressing as directed. Secured With Compression Wrap ThreePress (3 layer compression wrap) Discharge Instruction: Apply three layer compression as directed. Compression Stockings Circaid Juxta Lite Compression Wrap Quantity: 1 Right Leg Compression Amount: 30-40 mmHg Discharge Instruction: Apply Circaid Juxta Lite Compression Wrap daily as instructed. Apply first thing in the morning, remove at night before bed. Add-Ons Wound #2 (Lower Leg) Wound Laterality: Left, Lateral Cleanser Soap and Water Discharge Instruction: May shower and wash wound with dial antibacterial soap and water prior to dressing change. Peri-Wound Care Sween Lotion (Moisturizing lotion) Discharge Instruction: Apply moisturizing lotion as directed Topical Gentamicin Discharge Instruction: mix with mupirocin Mupirocin Ointment Discharge Instruction: Apply Mupirocin (Bactroban) as instructed Primary Dressing KerraCel Ag Gelling Fiber Dressing, 4x5 in (silver alginate) Discharge Instruction: Apply silver alginate to wound bed as instructed Secondary Dressing ABD Pad, 8x10 Discharge Instruction: Apply over primary dressing as directed. Secured With Compression Wrap ThreePress (3 layer compression wrap) Discharge Instruction: Apply three layer compression as directed. Compression Stockings Circaid Juxta Lite Compression Wrap Quantity: 1 Left Leg Compression Amount: 30-40 mmHg Discharge Instruction: Apply Circaid Juxta Lite Compression Wrap  daily as instructed. Apply first thing in the  morning, remove at night before bed. Add-Ons Electronic Signature(s) Signed: 09/07/2021 1:46:38 PM By: Kalman Shan DO Signed: 09/07/2021 4:34:49 PM By: Deon Pilling RN, BSN Entered By: Kalman Shan on 09/07/2021 13:27:30 -------------------------------------------------------------------------------- Multi-Disciplinary Care Plan Details Patient Name: Date of Service: Vanessa Palmer, Vanessa Chick D. 09/07/2021 9:45 A M Medical Record Number: [id number] Patient Account Number: [id number] Date of Birth/Sex: Treating RN: 11/09/1950 (71 y.o. [name], Meta.Reding Primary Care Jomes Giraldo: Gilles Chiquito Other Clinician: Referring Kevona Lupinacci: Treating Davinity Fanara/Extender: Aurelio Jew in Treatment: 0 Active Inactive Nutrition Nursing Diagnoses: Potential for alteratiion in Nutrition/Potential for imbalanced nutrition Goals: Patient/caregiver agrees to and verbalizes understanding of need to obtain nutritional consultation Date Initiated: 09/07/2021 Target Resolution Date: 09/29/2021 Goal Status: Active Interventions: Provide education on nutrition Treatment Activities: Patient referred to Primary Care Physician for further nutritional evaluation : 09/07/2021 Notes: Orientation to the Wound Care Program Nursing Diagnoses: Knowledge deficit related to the wound healing center program Goals: Patient/caregiver will verbalize understanding of the [facility] Date Initiated: 09/07/2021 Target Resolution Date: 09/28/2021 Goal Status: Active Interventions: Provide education on orientation to the wound center Notes: Pain, Acute or Chronic Nursing Diagnoses: Pain, acute or chronic: actual or potential Potential alteration in comfort, pain Goals: Patient will verbalize adequate pain control and receive pain control interventions during procedures as needed Date Initiated: 09/07/2021 Target Resolution Date: 09/28/2021 Goal  Status: Active Patient/caregiver will verbalize comfort level met Date Initiated: 09/07/2021 Target Resolution Date: 09/28/2021 Goal Status: Active Interventions: Assess comfort goal upon admission Provide education on pain management Notes: Venous Leg Ulcer Nursing Diagnoses: Potential for venous Insuffiency (use before diagnosis confirmed) Goals: Patient will maintain optimal edema control Date Initiated: 09/07/2021 Target Resolution Date: 09/29/2021 Goal Status: Active Interventions: Assess peripheral edema status every visit. Notes: Wound/Skin Impairment Nursing Diagnoses: Knowledge deficit related to ulceration/compromised skin integrity Goals: Patient/caregiver will verbalize understanding of skin care regimen Date Initiated: 09/07/2021 Target Resolution Date: 09/29/2021 Goal Status: Active Interventions: Assess patient/caregiver ability to perform ulcer/skin care regimen upon admission and as needed Assess ulceration(s) every visit Provide education on ulcer and skin care Treatment Activities: Skin care regimen initiated : 09/07/2021 Topical wound management initiated : 09/07/2021 Notes: Electronic Signature(s) Signed: 09/07/2021 4:34:49 PM By: Deon Pilling RN, BSN Entered By: Deon Pilling on 09/07/2021 10:56:22 -------------------------------------------------------------------------------- Pain Assessment Details Patient Name: Date of Service: Vanessa Small D. 09/07/2021 9:45 A M Medical Record Number: [id number] Patient Account Number: [id number] Date of Birth/Sex: Treating RN: [birth year] (71 y.o. Vanessa Palmer Primary Care Letecia Arps: Gilles Chiquito Other Clinician: Referring Eulanda Dorion: Treating Lukka Black/Extender: Aurelio Jew in Treatment: 0 Active Problems Location of Pain Severity and Description of Pain Patient Has Paino Yes Site Locations Pain Location: Generalized Pain, Pain in Ulcers Rate the pain. Current Pain Level: 7 Pain  Management and Medication Current Pain Management: Medication: No Cold Application: No Rest: No Massage: No Activity: No T.E.N.S.: No Heat Application: No Leg drop or elevation: No Is the Current Pain Management Adequate: Adequate How does your wound impact your activities of daily livingo Sleep: No Bathing: No Appetite: No Relationship With Others: No Bladder Continence: No Emotions: No Bowel Continence: No Work: No Toileting: No Drive: No Dressing: No Hobbies: No Engineer, maintenance) Signed: 09/07/2021 4:34:49 PM By: Deon Pilling RN, BSN Entered By: Deon Pilling on 09/07/2021 10:31:47 -------------------------------------------------------------------------------- Patient/Caregiver Education Details Patient Name: Date of Service: Vanessa Small D. 7/13/2023andnbsp9:45 A M Medical Record Number: [id number] Patient Account Number: [id number] Date of Birth/Gender: Treating RN: 02/03/1951 (70  y.o. Vanessa Palmer Primary Care Physician: Gilles Chiquito Other Clinician: Referring Physician: Treating Physician/Extender: Aurelio Jew in Treatment: 0 Education Assessment Education Provided To: Patient Education Topics Provided Nutrition: Handouts: Nutrition Methods: Explain/Verbal Responses: Reinforcements needed Electronic Signature(s) Signed: 09/07/2021 4:34:49 PM By: Deon Pilling RN, BSN Entered By: Deon Pilling on 09/07/2021 10:56:33 -------------------------------------------------------------------------------- Wound Assessment Details Patient Name: Date of Service: Vanessa Small D. 09/07/2021 9:45 A M Medical Record Number: [id number] Patient Account Number: [id number] Date of Birth/Sex: Treating RN: [birth year] (70 y.o. [name], Meta.Reding Primary Care Riyanna Crutchley: Gilles Chiquito Other Clinician: Referring Shriya Aker: Treating Annina Piotrowski/Extender: Jana Half Weeks in Treatment: 0 Wound Status Wound Number: 1  Primary Lymphedema Etiology: Wound Location: Right, Posterior Lower Leg Wound Open Wounding Event: Blister Status: Date Acquired: 09/01/2021 Comorbid Anemia, Asthma, Sleep Apnea, Congestive Heart Failure, Coronary Weeks Of Treatment: 0 History: Artery Disease, Deep Vein Thrombosis, Hypertension, Myocardial Clustered Wound: Yes Infarction, Gout, Osteoarthritis, Seizure Disorder Photos Wound Measurements Length: (cm) 14 Width: (cm) 9 Depth: (cm) 0.1 Clustered Quantity: 5 Area: (cm) 98.96 Volume: (cm) 9.896 % Reduction in Area: 0% % Reduction in Volume: 0% Epithelialization: Small (1-33%) Tunneling: No Undermining: No Wound Description Classification: Full Thickness Without Exposed Support Structures Wound Margin: Distinct, outline attached Exudate Amount: Medium Exudate Type: Serosanguineous Exudate Color: red, brown Foul Odor After Cleansing: No Slough/Fibrino Yes Wound Bed Granulation Amount: Large (67-100%) Exposed Structure Granulation Quality: Red, Pink Fascia Exposed: No Necrotic Amount: Small (1-33%) Fat Layer (Subcutaneous Tissue) Exposed: Yes Necrotic Quality: Adherent Slough Tendon Exposed: No Muscle Exposed: No Joint Exposed: No Bone Exposed: No Treatment Notes Wound #1 (Lower Leg) Wound Laterality: Right, Posterior Cleanser Soap and Water Discharge Instruction: May shower and wash wound with dial antibacterial soap and water prior to dressing change. Peri-Wound Care Sween Lotion (Moisturizing lotion) Discharge Instruction: Apply moisturizing lotion as directed Topical Gentamicin Discharge Instruction: mix with mupirocin Mupirocin Ointment Discharge Instruction: Apply Mupirocin (Bactroban) as instructed Primary Dressing KerraCel Ag Gelling Fiber Dressing, 4x5 in (silver alginate) Discharge Instruction: Apply silver alginate to wound bed as instructed Secondary Dressing ABD Pad, 8x10 Discharge Instruction: Apply over primary dressing as  directed. Secured With Compression Wrap ThreePress (3 layer compression wrap) Discharge Instruction: Apply three layer compression as directed. Compression Stockings Circaid Juxta Lite Compression Wrap Quantity: 1 Right Leg Compression Amount: 30-40 mmHg Discharge Instruction: Apply Circaid Juxta Lite Compression Wrap daily as instructed. Apply first thing in the morning, remove at night before bed. Add-Ons Electronic Signature(s) Signed: 09/07/2021 4:34:49 PM By: Deon Pilling RN, BSN Signed: 09/07/2021 4:37:01 PM By: Erenest Blank Entered By: Erenest Blank on 09/07/2021 10:44:32 -------------------------------------------------------------------------------- Wound Assessment Details Patient Name: Date of Service: Vanessa Small D. 09/07/2021 9:45 A M Medical Record Number: [id number] Patient Account Number: [id number] Date of Birth/Sex: Treating RN: 04/20/1950 (71 y.o. [name], Meta.Reding Primary Care Blakeley Scheier: Gilles Chiquito Other Clinician: Referring Norma Montemurro: Treating Julene Rahn/Extender: Jana Half Weeks in Treatment: 0 Wound Status Wound Number: 2 Primary Lymphedema Etiology: Wound Location: Left, Lateral Lower Leg Wound Open Wounding Event: Blister Status: Date Acquired: 09/01/2021 Comorbid Anemia, Asthma, Sleep Apnea, Congestive Heart Failure, Coronary Weeks Of Treatment: 0 History: Artery Disease, Deep Vein Thrombosis, Hypertension, Myocardial Clustered Wound: Yes Infarction, Gout, Osteoarthritis, Seizure Disorder Photos Wound Measurements Length: (cm) 1.5 Width: (cm) 2.5 Depth: (cm) 0.1 Clustered Quantity: 4 Area: (cm) 2.945 Volume: (cm) 0.295 % Reduction in Area: 0% % Reduction in Volume: 0% Epithelialization: Small (1-33%) Tunneling: No Undermining: No Wound Description Classification: Full  Thickness Without Exposed Support Structures Wound Margin: Distinct, outline attached Exudate Amount: Medium Exudate Type:  Serosanguineous Exudate Color: red, brown Foul Odor After Cleansing: No Slough/Fibrino Yes Wound Bed Granulation Amount: Large (67-100%) Exposed Structure Granulation Quality: Red Fascia Exposed: No Necrotic Amount: None Present (0%) Fat Layer (Subcutaneous Tissue) Exposed: Yes Tendon Exposed: No Muscle Exposed: No Joint Exposed: No Bone Exposed: No Treatment Notes Wound #2 (Lower Leg) Wound Laterality: Left, Lateral Cleanser Soap and Water Discharge Instruction: May shower and wash wound with dial antibacterial soap and water prior to dressing change. Peri-Wound Care Sween Lotion (Moisturizing lotion) Discharge Instruction: Apply moisturizing lotion as directed Topical Gentamicin Discharge Instruction: mix with mupirocin Mupirocin Ointment Discharge Instruction: Apply Mupirocin (Bactroban) as instructed Primary Dressing KerraCel Ag Gelling Fiber Dressing, 4x5 in (silver alginate) Discharge Instruction: Apply silver alginate to wound bed as instructed Secondary Dressing ABD Pad, 8x10 Discharge Instruction: Apply over primary dressing as directed. Secured With Compression Wrap ThreePress (3 layer compression wrap) Discharge Instruction: Apply three layer compression as directed. Compression Stockings Circaid Juxta Lite Compression Wrap Quantity: 1 Left Leg Compression Amount: 30-40 mmHg Discharge Instruction: Apply Circaid Juxta Lite Compression Wrap daily as instructed. Apply first thing in the morning, remove at night before bed. Add-Ons Electronic Signature(s) Signed: 09/07/2021 4:34:49 PM By: Deon Pilling RN, BSN Signed: 09/07/2021 4:37:01 PM By: Erenest Blank Entered By: Erenest Blank on 09/07/2021 10:44:47 -------------------------------------------------------------------------------- Vitals Details Patient Name: Date of Service: Vanessa Palmer, Vanessa Chick D. 09/07/2021 9:45 A M Medical Record Number: [id number] Patient Account Number: [id number] Date of  Birth/Sex: Treating RN: 1951-01-23 (71 y.o. [name], Tammi Klippel Primary Care Luverne Zerkle: Gilles Chiquito Other Clinician: Referring Lindell Renfrew: Treating Anabella Capshaw/Extender: Aurelio Jew in Treatment: 0 Vital Signs Time Taken: 10:25 Temperature (F): 98.4 Height (in): 65 Pulse (bpm): 77 Source: Stated Respiratory Rate (breaths/min): 20 Weight (lbs): 274 Blood Pressure (mmHg): 164/99 Source: Stated Reference Range: 80 - 120 mg / dl Body Mass Index (BMI): 45.6 Electronic Signature(s) Signed: 09/07/2021 4:34:49 PM By: Deon Pilling RN, BSN Entered By: Deon Pilling on 09/07/2021 10:29:58

## 2021-09-14 ENCOUNTER — Ambulatory Visit (INDEPENDENT_AMBULATORY_CARE_PROVIDER_SITE_OTHER): Payer: Medicare Other | Admitting: Physician Assistant

## 2021-09-14 ENCOUNTER — Encounter: Payer: Self-pay | Admitting: Physician Assistant

## 2021-09-14 ENCOUNTER — Encounter (HOSPITAL_BASED_OUTPATIENT_CLINIC_OR_DEPARTMENT_OTHER): Payer: Medicare Other | Admitting: Internal Medicine

## 2021-09-14 DIAGNOSIS — I890 Lymphedema, not elsewhere classified: Secondary | ICD-10-CM | POA: Diagnosis not present

## 2021-09-14 DIAGNOSIS — M25551 Pain in right hip: Secondary | ICD-10-CM | POA: Diagnosis not present

## 2021-09-14 DIAGNOSIS — Z96641 Presence of right artificial hip joint: Secondary | ICD-10-CM

## 2021-09-14 DIAGNOSIS — I87313 Chronic venous hypertension (idiopathic) with ulcer of bilateral lower extremity: Secondary | ICD-10-CM

## 2021-09-14 DIAGNOSIS — L97822 Non-pressure chronic ulcer of other part of left lower leg with fat layer exposed: Secondary | ICD-10-CM

## 2021-09-14 DIAGNOSIS — L97812 Non-pressure chronic ulcer of other part of right lower leg with fat layer exposed: Secondary | ICD-10-CM

## 2021-09-14 MED ORDER — TIZANIDINE HCL 4 MG PO TABS
ORAL_TABLET | ORAL | 0 refills | Status: DC
Start: 2021-09-14 — End: 2021-10-06

## 2021-09-14 NOTE — Progress Notes (Signed)
TENIA, HARK ([id number]) Visit Report for 09/14/2021 Chief Complaint Document Details Patient Name: Date of Service: Vanessa Palmer, Vanessa Palmer 09/14/2021 9:00 A M Medical Record Number: [id number] Patient Account Number: [id number] Date of Birth/Sex: Treating Vanessa Palmer: 16-Jan-1951 (71 y.o. Vanessa Palmer Primary Care Provider: Gilles Chiquito Other Clinician: Referring Provider: Treating Provider/Extender: Aurelio Jew in Treatment: 1 Information Obtained from: Patient Chief Complaint 09/07/2021; scattered open wounds to the lower extremities bilaterally Electronic Signature(s) Signed: 09/14/2021 2:00:38 PM By: Kalman Shan DO Entered By: Kalman Shan on 09/14/2021 10:40:13 -------------------------------------------------------------------------------- HPI Details Patient Name: Date of Service: Vanessa Palmer, Vanessa Chick D. 09/14/2021 9:00 A M Medical Record Number: [id number] Patient Account Number: [id number] Date of Birth/Sex: Treating Vanessa Palmer: Dec 17, 1950 (71 y.o. Vanessa Palmer Primary Care Provider: Gilles Chiquito Other Clinician: Referring Provider: Treating Provider/Extender: Aurelio Jew in Treatment: 1 History of Present Illness HPI Description: Admission 09/07/2021 Vanessa Palmer is a 71 year old female with a past medical history of essential hypertension, chronic kidney disease stage III, and lymphedema that presents to the clinic for a 2-week history of scattered open wounds to her lower extremities bilaterally. She states this has been an ongoing issue for the past year. She states that the wounds wax and wane in healing. She currently takes torsemide. She does not wear compression stockings. She currently denies signs of infection. 7/20; patient presents for follow-up. She received 1 juxta lite compression wrap in the mail. She was supposed to receive 2. She states she talked to the company and they are sending her her second pair. We  used antibiotic ointment and silver alginate under 3 layer compression at last clinic visit. The silver alginate stuck to the wound bed. The left lower extremity wounds have healed. Electronic Signature(s) Signed: 09/14/2021 2:00:38 PM By: Kalman Shan DO Entered By: Kalman Shan on 09/14/2021 10:42:34 -------------------------------------------------------------------------------- Physical Exam Details Patient Name: Date of Service: Vanessa Small D. 09/14/2021 9:00 A M Medical Record Number: [id number] Patient Account Number: [id number] Date of Birth/Sex: Treating Vanessa Palmer: February 26, 1951 (71 y.o. Vanessa Palmer Primary Care Provider: Gilles Chiquito Other Clinician: Referring Provider: Treating Provider/Extender: Aurelio Jew in Treatment: 1 Constitutional respirations regular, non-labored and within target range for patient.. Cardiovascular 2+ dorsalis pedis/posterior tibialis pulses. Psychiatric pleasant and cooperative. Notes Left lower extremity: Epithelization to the previous wound sites Right lower extremity: Few scattered open wounds with granulation tissue. No signs of surrounding infection. Good edema control. Electronic Signature(s) Signed: 09/14/2021 2:00:38 PM By: Kalman Shan DO Entered By: Kalman Shan on 09/14/2021 10:43:15 -------------------------------------------------------------------------------- Physician Orders Details Patient Name: Date of Service: Vanessa Palmer, Vanessa Chick D. 09/14/2021 9:00 A M Medical Record Number: [id number] Patient Account Number: [id number] Date of Birth/Sex: Treating Vanessa Palmer: 01/08/1951 (71 y.o. Vanessa Palmer, Vanessa Palmer Primary Care Provider: Gilles Chiquito Other Clinician: Referring Provider: Treating Provider/Extender: Aurelio Jew in Treatment: 1 Verbal / Phone Orders: No Diagnosis Coding ICD-10 Coding Code Description [phone] Non-pressure chronic ulcer of other part of right lower leg with  fat layer exposed L97.822 Non-pressure chronic ulcer of other part of left lower leg with fat layer exposed I89.0 Lymphedema, not elsewhere classified I87.313 Chronic venous hypertension (idiopathic) with ulcer of bilateral lower extremity Follow-up Appointments ppointment in 1 week. - Dr. Heber [place] and [place], Room 8 09/21/2021 0900 Thursday Return A Other: - Bring in for the right leg-juxtalite HD. Bathing/ Shower/ Hygiene May shower with protection but do not get wound dressing(s) wet. Edema Control - Lymphedema / SCD /  Other Elevate legs to the level of the heart or above for 30 minutes daily and/or when sitting, a frequency of: - 3-4 times a day throughout the day. Avoid standing for long periods of time. Exercise regularly Moisturize legs daily. - lotion left leg every night before bed. Compression stocking or Garment 30-40 mm/Hg pressure to: - Apply in the morning and remove at night to left leg. Wound Treatment Wound #1 - Lower Leg Wound Laterality: Right, Posterior Peri-Wound Care: Sween Lotion (Moisturizing lotion) 1 x Per Week/30 Days Discharge Instructions: Apply moisturizing lotion as directed Prim Dressing: Xeroform Occlusive Gauze Dressing, 4x4 in 1 x Per Week/30 Days ary Discharge Instructions: Apply to wound bed as instructed Secondary Dressing: ABD Pad, 8x10 1 x Per Week/30 Days Discharge Instructions: Apply over primary dressing as directed. Compression Wrap: ThreePress (3 layer compression wrap) 1 x Per Week/30 Days Discharge Instructions: Apply three layer compression as directed. Electronic Signature(s) Signed: 09/14/2021 2:00:38 PM By: Geralyn Corwin DO Entered By: Geralyn Corwin on 09/14/2021 10:43:29 -------------------------------------------------------------------------------- Problem List Details Patient Name: Date of Service: Vanessa Palmer, Vanessa Breath D. 09/14/2021 9:00 A M Medical Record Number: [id number] Patient Account Number: [id number] Date of  Birth/Sex: Treating Vanessa Palmer: Jul 04, 1950 (71 y.o. Vanessa Palmer Primary Care Provider: [name] Other Clinician: Referring Provider: Treating Provider/Extender: [name] in Treatment: 1 Active Problems ICD-10 Encounter Code Description Active Date MDM Diagnosis [phone] Non-pressure chronic ulcer of other part of right lower leg with fat layer 09/07/2021 No Yes exposed L97.822 Non-pressure chronic ulcer of other part of left lower leg with fat layer exposed7/13/2023 No Yes I89.0 Lymphedema, not elsewhere classified 09/07/2021 No Yes I87.313 Chronic venous hypertension (idiopathic) with ulcer of bilateral lower extremity 09/07/2021 No Yes Inactive Problems Resolved Problems Electronic Signature(s) Signed: 09/14/2021 2:00:38 PM By: Geralyn Corwin DO Entered By: Geralyn Corwin on 09/14/2021 10:39:43 -------------------------------------------------------------------------------- Progress Note Details Patient Name: Date of Service: Vanessa Rad D. 09/14/2021 9:00 A M Medical Record Number: [id number] Patient Account Number: [id number] Date of Birth/Sex: Treating Vanessa Palmer: 1950/04/30 (71 y.o. Vanessa Palmer Primary Care Provider: [name] Other Clinician: Referring Provider: Treating Provider/Extender: [name] in Treatment: 1 Subjective Chief Complaint Information obtained from Patient 09/07/2021; scattered open wounds to the lower extremities bilaterally History of Present Illness (HPI) Admission 09/07/2021 [name] is a 71 year old female with a past medical history of essential hypertension, chronic kidney disease stage III, and lymphedema that presents to the clinic for a 2-week history of scattered open wounds to her lower extremities bilaterally. She states this has been an ongoing issue for the past year. She states that the wounds wax and wane in healing. She currently takes torsemide. She does not  wear compression stockings. She currently denies signs of infection. 7/20; patient presents for follow-up. She received 1 juxta lite compression wrap in the mail. She was supposed to receive 2. She states she talked to the company and they are sending her her second pair. We used antibiotic ointment and silver alginate under 3 layer compression at last clinic visit. The silver alginate stuck to the wound bed. The left lower extremity wounds have healed. Patient History Information obtained from Chart. Family History Diabetes - Siblings, Heart Disease - Father,Siblings. Social History Never smoker, Alcohol Use - Rarely - sober since 2006. Medical History Hematologic/Lymphatic Patient has history of Anemia Respiratory Patient has history of Asthma, Sleep Apnea - 2018- oCPAP Cardiovascular Patient has history of Congestive Heart Failure, Coronary Artery Disease, Deep  Vein Thrombosis - 2014 both legs, Hypertension, Myocardial Infarction - 2005 Musculoskeletal Patient has history of Gout, Osteoarthritis Neurologic Patient has history of Seizure Disorder - as a teenager Hospitalization/Surgery History - heart cath 2018, 2003, 2004, 2006. - right total hip replacement 2016. - hysterectomy. Medical A Surgical History Notes nd Constitutional Symptoms (General Health) chronic back pain Migraine last 2018 Cardiovascular hyperlipidemia Gastrointestinal GERD Genitourinary stage III CKD Musculoskeletal DJD Psychiatric depression Objective Constitutional respirations regular, non-labored and within target range for patient.. Vitals Time Taken: 9:13 AM, Height: 65 in, Weight: 274 lbs, BMI: 45.6, Temperature: 97.8 F, Pulse: 80 bpm, Respiratory Rate: 18 breaths/min, Blood Pressure: 152/90 mmHg. Cardiovascular 2+ dorsalis pedis/posterior tibialis pulses. Psychiatric pleasant and cooperative. General Notes: Left lower extremity: Epithelization to the previous wound sites Right lower  extremity: Few scattered open wounds with granulation tissue. No signs of surrounding infection. Good edema control. Integumentary (Hair, Skin) Wound #1 status is Open. Original cause of wound was Blister. The date acquired was: 09/01/2021. The wound has been in treatment 1 weeks. The wound is located on the Right,Posterior Lower Leg. The wound measures 0.9cm length x 0.7cm width x 0.1cm depth; 0.495cm^2 area and 0.049cm^3 volume. There is a medium amount of serosanguineous drainage noted. Wound #2 status is Open. Original cause of wound was Blister. The date acquired was: 09/01/2021. The wound has been in treatment 1 weeks. The wound is located on the Left,Lateral Lower Leg. The wound measures 0cm length x 0cm width x 0cm depth; 0cm^2 area and 0cm^3 volume. There is no tunneling or undermining noted. There is a none present amount of drainage noted. The wound margin is distinct with the outline attached to the wound base. There is no granulation within the wound bed. There is no necrotic tissue within the wound bed. Assessment Active Problems ICD-10 Non-pressure chronic ulcer of other part of right lower leg with fat layer exposed Non-pressure chronic ulcer of other part of left lower leg with fat layer exposed Lymphedema, not elsewhere classified Chronic venous hypertension (idiopathic) with ulcer of bilateral lower extremity Patient has done well with silver alginate under compression therapy. Her left lower extremity wounds have healed. T the right lower extremity the wound o dressing was sticking to the wound bed. I recommended switching this to Xeroform and continuing 3 layer compression. T the left lower extremity we will o show her how to use the juxta lite compression and I asked her to use this daily.Follow-up in 1 week. Procedures Wound #1 Pre-procedure diagnosis of Wound #1 is a Lymphedema located on the Right,Posterior Lower Leg . There was a Three Layer Compression Therapy  Procedure by Vanessa Pilling, Vanessa Palmer. Post procedure Diagnosis Wound #1: Same as Pre-Procedure Plan Follow-up Appointments: Return Appointment in 1 week. - Dr. Heber [place] and [place], Room 8 09/21/2021 0900 Thursday Other: - Bring in for the right leg-juxtalite HD. Bathing/ Shower/ Hygiene: May shower with protection but do not get wound dressing(s) wet. Edema Control - Lymphedema / SCD / Other: Elevate legs to the level of the heart or above for 30 minutes daily and/or when sitting, a frequency of: - 3-4 times a day throughout the day. Avoid standing for long periods of time. Exercise regularly Moisturize legs daily. - lotion left leg every night before bed. Compression stocking or Garment 30-40 mm/Hg pressure to: - Apply in the morning and remove at night to left leg. WOUND #1: - Lower Leg Wound Laterality: Right, Posterior Peri-Wound Care: Sween Lotion (Moisturizing lotion) 1 x Per Week/30  Days Discharge Instructions: Apply moisturizing lotion as directed Prim Dressing: Xeroform Occlusive Gauze Dressing, 4x4 in 1 x Per Week/30 Days ary Discharge Instructions: Apply to wound bed as instructed Secondary Dressing: ABD Pad, 8x10 1 x Per Week/30 Days Discharge Instructions: Apply over primary dressing as directed. Com pression Wrap: ThreePress (3 layer compression wrap) 1 x Per Week/30 Days Discharge Instructions: Apply three layer compression as directed. 1. Xeroform under 3 layer compression to the right lower extremity 2. Juxta light compression daily to the left lower extremity Electronic Signature(s) Signed: 09/14/2021 2:00:38 PM By: Geralyn Corwin DO Entered By: Geralyn Corwin on 09/14/2021 10:45:07 -------------------------------------------------------------------------------- HxROS Details Patient Name: Date of Service: Vanessa Palmer, Dwanda D. 09/14/2021 9:00 A M Medical Record Number: [id number] Patient Account Number: [id number] Date of Birth/Sex: Treating Vanessa Palmer: February 23, 1951 (71 y.o. Vanessa Palmer, Vanessa Palmer Primary Care Provider: [name] Other Clinician: Referring Provider: Treating Provider/Extender: [name] in Treatment: 1 Information Obtained From Chart Constitutional Symptoms (General Health) Medical History: Past Medical History Notes: chronic back pain Migraine last 2018 Hematologic/Lymphatic Medical History: Positive for: Anemia Respiratory Medical History: Positive for: Asthma; Sleep Apnea - 2018- oCPAP Cardiovascular Medical History: Positive for: Congestive Heart Failure; Coronary Artery Disease; Deep Vein Thrombosis - 2014 both legs; Hypertension; Myocardial Infarction - 2005 Past Medical History Notes: hyperlipidemia Gastrointestinal Medical History: Past Medical History Notes: GERD Genitourinary Medical History: Past Medical History Notes: stage III CKD Musculoskeletal Medical History: Positive for: Gout; Osteoarthritis Past Medical History Notes: DJD Neurologic Medical History: Positive for: Seizure Disorder - as a teenager Psychiatric Medical History: Past Medical History Notes: depression Immunizations Pneumococcal Vaccine: Received Pneumococcal Vaccination: No Implantable Devices No devices added Hospitalization / Surgery History Type of Hospitalization/Surgery heart cath 2018, 2003, 2004, 2006 right total hip replacement 2016 hysterectomy Family and Social History Diabetes: Yes - Siblings; Heart Disease: Yes - Father,Siblings; Never smoker; Alcohol Use: Rarely - sober since 2006 Electronic Signature(s) Signed: 09/14/2021 2:00:38 PM By: Geralyn Corwin DO Signed: 09/14/2021 5:46:02 PM By: Vanessa Stall Vanessa Palmer, Vanessa Palmer Entered By: Geralyn Corwin on 09/14/2021 10:42:40 -------------------------------------------------------------------------------- SuperBill Details Patient Name: Date of Service: Vanessa Palmer, Vanessa Breath D. 09/14/2021 Medical Record Number: [id number] Patient Account Number: [id number] Date  of Birth/Sex: Treating Vanessa Palmer: [birth year] (71 y.o. Vanessa Palmer, Millard.[place] Primary Care Provider: [name] Other Clinician: Referring Provider: Treating Provider/Extender: [name] in Treatment: 1 Diagnosis Coding ICD-10 Codes Code Description [phone] Non-pressure chronic ulcer of other part of right lower leg with fat layer exposed L97.822 Non-pressure chronic ulcer of other part of left lower leg with fat layer exposed I89.0 Lymphedema, not elsewhere classified I87.313 Chronic venous hypertension (idiopathic) with ulcer of bilateral lower extremity Facility Procedures CPT4 Code: [id number] Description: (Facility Use Only) [phone] - APPLY MULTLAY COMPRS LWR RT LEG Modifier: Quantity: 1 Physician Procedures : CPT4 Code Description Modifier [id number] - WC PHYS LEVEL 3 - EST PT ICD-10 Diagnosis Description L97.812 Non-pressure chronic ulcer of other part of right lower leg with fat layer exposed L97.822 Non-pressure chronic ulcer of other part of left  lower leg with fat layer exposed I89.0 Lymphedema, not elsewhere classified I87.313 Chronic venous hypertension (idiopathic) with ulcer of bilateral lower extremity Quantity: 1 Electronic Signature(s) Signed: 09/14/2021 2:00:38 PM By: Geralyn Corwin DO Entered By: Geralyn Corwin on 09/14/2021 10:45:26

## 2021-09-14 NOTE — Addendum Note (Signed)
Addended by: Richardean Canal on: 09/14/2021 05:27 PM   Modules accepted: Orders

## 2021-09-14 NOTE — Progress Notes (Signed)
HPI: Vanessa Palmer comes in today due to right hip pain.  She states she is having groin pain.  She has had no falls or injuries.  History of right total hip arthroplasty 11/23/2014.  She had work-up with plain films, CT scan and MRI.  Ashby Dawes of these studies showed no acute findings or any failure or obvious signs of loosening of the hip replacement.  She states her pain is remained the same since she was seen last July.  She denies any fevers or chills.  However she has had lower leg wounds that are being treated with compression dressings and states that she was on antibiotics due to the infections of both lower legs.  Notes that the groin pain does awaken her at night.  No radicular symptoms down either leg.  She is able to ambulate with a walker.  Review of systems see HPI otherwise negative.  Physical exam: General well-developed well-nourished female seated in wheelchair no acute distress.  Bilateral hips good range of motion of both hips internal rotation of the right hip because of discomfort.  Bilateral knees good range of motion of both knees.  Significant patellofemoral crepitus both knees.  Compression dressings her on both lower legs.  Impression: Right hip pain History of right total hip arthroplasty 11/23/2014  Plan: We will obtain bone scan to rule out infection/loosening.  Also draw sed rate CRP and CBC.  Follow-up after the bone scan to go over the results and discuss further treatment.  Questions were encouraged and answered at length.

## 2021-09-14 NOTE — Progress Notes (Signed)
Vanessa Palmer ([id number]) Visit Report for 09/14/2021 Arrival Information Details Patient Name: Date of Service: Vanessa Palmer, Vanessa Palmer 09/14/2021 9:00 A M Medical Record Number: [id number] Patient Account Number: [id number] Date of Birth/Sex: Treating RN: 05/19/1950 (71 y.o. [name], Tammi Klippel Primary Care Josph Norfle[place]: Gilles Chiquito Other Clinician: Referring Jeniffer Culliver: Treating Antwone Capozzoli/Extender: Aurelio Jew in Treatment: 1 Visit Information History Since Last Visit Added or deleted any medications: No Patient Arrived: Wheel Chair Any new allergies or adverse reactions: No Arrival Time: 09:10 Had a fall or experienced change in No Accompanied By: daughter activities of daily living that may affect Transfer Assistance: None risk of falls: Patient Identification Verified: Yes Signs or symptoms of abuse/neglect since last visito No Secondary Verification Process Completed: Yes Hospitalized since last visit: No Patient Requires Transmission-Based Precautions: No Implantable device outside of the clinic excluding No Patient Has Alerts: Yes cellular tissue based products placed in the center Patient Alerts: Patient on Blood Thinner since last visit: Has Dressing in Place as Prescribed: Yes Has Compression in Place as Prescribed: Yes Pain Present Now: Yes Electronic Signature(s) Signed: 09/14/2021 4:23:23 PM By: Erenest Blank Entered By: Erenest Blank on 09/14/2021 09:31:40 -------------------------------------------------------------------------------- Compression Therapy Details Patient Name: Date of Service: Vanessa Small D. 09/14/2021 9:00 A M Medical Record Number: [id number] Patient Account Number: [id number] Date of Birth/Sex: Treating RN: 1950-10-13 (71 y.o. [name] Primary Care Caidon Foti: Gilles Chiquito Other Clinician: Referring Diane Mochizuki: Treating Charleigh Correnti/Extender: Aurelio Jew in Treatment: 1 Compression Therapy  Performed for Wound Assessment: Wound #1 Right,Posterior Lower Leg Performed By: Clinician Deon Pilling, RN Compression Type: Three Layer Post Procedure Diagnosis Same as Pre-procedure Electronic Signature(s) Signed: 09/14/2021 5:46:02 PM By: Deon Pilling RN, BSN Entered By: Deon Pilling on 09/14/2021 09:55:20 -------------------------------------------------------------------------------- Encounter Discharge Information Details Patient Name: Date of Service: Vanessa Palmer, Vanessa Chick D. 09/14/2021 9:00 A M Medical Record Number: [id number] Patient Account Number: [id number] Date of Birth/Sex: Treating RN: 31-Jul-1950 (71 y.o. [name] Primary Care Renell Coaxum: Gilles Chiquito Other Clinician: Referring Jamarria Real: Treating Caio Devera/Extender: Aurelio Jew in Treatment: 1 Encounter Discharge Information Items Discharge Condition: Stable Ambulatory Status: Wheelchair Discharge Destination: Home Transportation: Private Auto Accompanied By: daughter Schedule Follow-up Appointment: Yes Clinical Summary of Care: Notes educated patient and daughter how to apply juxtalite HD. Electronic Signature(s) Signed: 09/14/2021 5:46:02 PM By: Deon Pilling RN, BSN Entered By: Deon Pilling on 09/14/2021 10:15:23 -------------------------------------------------------------------------------- Lower Extremity Assessment Details Patient Name: Date of Service: Vanessa Palmer, Vanessa Chick D. 09/14/2021 9:00 A M Medical Record Number: [id number] Patient Account Number: [id number] Date of Birth/Sex: Treating RN: 01-24-1951 (71 y.o. [name], Tammi Klippel Primary Care Siyah Mault: Gilles Chiquito Other Clinician: Referring Dallan Schonberg: Treating Cathyrn Deas/Extender: Jana Half Weeks in Treatment: 1 Edema Assessment Assessed: [Left: Yes] [Right: Yes] Edema: [Left: Yes] [Right: Yes] Calf Left: Right: Point of Measurement: 33 cm From Medial Instep 51 cm 52.5 cm Ankle Left:  Right: Point of Measurement: 9 cm From Medial Instep 27 cm 27.3 cm Electronic Signature(s) Signed: 09/14/2021 4:23:23 PM By: Erenest Blank Signed: 09/14/2021 5:46:02 PM By: Deon Pilling RN, BSN Entered By: Erenest Blank on 09/14/2021 09:27:37 -------------------------------------------------------------------------------- Multi Wound Chart Details Patient Name: Date of Service: Vanessa Small D. 09/14/2021 9:00 A M Medical Record Number: [id number] Patient Account Number: [id number] Date of Birth/Sex: Treating RN: 02-16-1951 (71 y.o. [name] Primary Care Lamari Beckles: Gilles Chiquito Other Clinician: Referring Jabria Loos: Treating Acadia Thammavong/Extender: Aurelio Jew in Treatment: 1 Vital Signs Height(in): 65 Pulse(bpm):  80 Weight(lbs): 274 Blood Pressure(mmHg): 152/90 Body Mass Index(BMI): 45.6 Temperature(F): 97.8 Respiratory Rate(breaths/min): 18 Photos: [1:No Photos Right, Posterior Lower Leg] [2:No Photos Left, Lateral Lower Leg] [N/A:N/A N/A] Wound Location: [1:Blister] [2:Blister] [N/A:N/A] Wounding Event: [1:Lymphedema] [2:Lymphedema] [N/A:N/A] Primary Etiology: [1:N/A] [2:Anemia, Asthma, Sleep Apnea,] [N/A:N/A] Comorbid History: [1:09/01/2021] [2:Congestive Heart Failure, Coronary Artery Disease, Deep Vein Thrombosis, Hypertension, Myocardial Infarction, Gout, Osteoarthritis, Seizure Disorder 09/01/2021] [N/A:N/A] Date Acquired: [1:1] [2:1] [N/A:N/A] Weeks of Treatment: [1:Open] [2:Open] [N/A:N/A] Wound Status: [1:No] [2:No] [N/A:N/A] Wound Recurrence: [1:Yes] [2:Yes] [N/A:N/A] Clustered Wound: [1:N/A] [2:0] [N/A:N/A] Clustered Quantity: [1:0.9x0.7x0.1] [2:0x0x0] [N/A:N/A] Measurements L x W x D (cm) [1:0.495] [2:0] [N/A:N/A] A (cm) : rea [1:0.049] [2:0] [N/A:N/A] Volume (cm) : [1:99.50%] [2:100.00%] [N/A:N/A] % Reduction in Area: [1:99.50%] [2:100.00%] [N/A:N/A] % Reduction in Volume: [1:Full Thickness Without Exposed] [2:Full Thickness  Without Exposed] [N/A:N/A] Classification: [1:Support Structures Medium] [2:Support Structures None Present] [N/A:N/A] Exudate A mount: [1:Serosanguineous] [2:N/A] [N/A:N/A] Exudate Type: [1:red, brown] [2:N/A] [N/A:N/A] Exudate Color: [1:N/A] [2:Distinct, outline attached] [N/A:N/A] Wound Margin: [1:N/A] [2:None Present (0%)] [N/A:N/A] Granulation A mount: [1:N/A] [2:None Present (0%)] [N/A:N/A] Necrotic A mount: [1:N/A] [2:Large (67-100%)] [N/A:N/A] Epithelialization: [1:Compression Therapy] [2:N/A] [N/A:N/A] Treatment Notes Wound #1 (Lower Leg) Wound Laterality: Right, Posterior Cleanser Peri-Wound Care Sween Lotion (Moisturizing lotion) Discharge Instruction: Apply moisturizing lotion as directed Topical Primary Dressing Xeroform Occlusive Gauze Dressing, 4x4 in Discharge Instruction: Apply to wound bed as instructed Secondary Dressing ABD Pad, 8x10 Discharge Instruction: Apply over primary dressing as directed. Secured With Compression Wrap ThreePress (3 layer compression wrap) Discharge Instruction: Apply three layer compression as directed. Compression Stockings Add-Ons Wound #2 (Lower Leg) Wound Laterality: Left, Lateral Cleanser Peri-Wound Care Topical Primary Dressing Secondary Dressing Secured With Compression Wrap Compression Stockings Add-Ons Electronic Signature(s) Signed: 09/14/2021 2:00:38 PM By: Kalman Shan DO Signed: 09/14/2021 5:46:02 PM By: Deon Pilling RN, BSN Entered By: Kalman Shan on 09/14/2021 10:39:48 -------------------------------------------------------------------------------- Multi-Disciplinary Care Plan Details Patient Name: Date of Service: Vanessa Small D. 09/14/2021 9:00 A M Medical Record Number: [id number] Patient Account Number: [id number] Date of Birth/Sex: Treating RN: Apr 27, 1950 (71 y.o. [name], Tammi Klippel Primary Care Tateanna Bach: Gilles Chiquito Other Clinician: Referring Rozina Pointer: Treating Carlina Derks/Extender: Aurelio Jew in Treatment: 1 Active Inactive Nutrition Nursing Diagnoses: Potential for alteratiion in Nutrition/Potential for imbalanced nutrition Goals: Patient/caregiver agrees to and verbalizes understanding of need to obtain nutritional consultation Date Initiated: 09/07/2021 Target Resolution Date: 09/29/2021 Goal Status: Active Interventions: Provide education on nutrition Treatment Activities: Education provided on Nutrition : 09/07/2021 Patient referred to Primary Care Physician for further nutritional evaluation : 09/07/2021 Notes: Pain, Acute or Chronic Nursing Diagnoses: Pain, acute or chronic: actual or potential Potential alteration in comfort, pain Goals: Patient will verbalize adequate pain control and receive pain control interventions during procedures as needed Date Initiated: 09/07/2021 Target Resolution Date: 09/28/2021 Goal Status: Active Patient/caregiver will verbalize comfort level met Date Initiated: 09/07/2021 Target Resolution Date: 09/28/2021 Goal Status: Active Interventions: Assess comfort goal upon admission Provide education on pain management Notes: Venous Leg Ulcer Nursing Diagnoses: Potential for venous Insuffiency (use before diagnosis confirmed) Goals: Patient will maintain optimal edema control Date Initiated: 09/07/2021 Target Resolution Date: 09/29/2021 Goal Status: Active Interventions: Assess peripheral edema status every visit. Notes: Wound/Skin Impairment Nursing Diagnoses: Knowledge deficit related to ulceration/compromised skin integrity Goals: Patient/caregiver will verbalize understanding of skin care regimen Date Initiated: 09/07/2021 Target Resolution Date: 09/29/2021 Goal Status: Active Interventions: Assess patient/caregiver ability to perform ulcer/skin care regimen upon admission and as needed Assess ulceration(s) every visit Provide  education on ulcer and skin care Treatment Activities: Skin care regimen  initiated : 09/07/2021 Topical wound management initiated : 09/07/2021 Notes: Electronic Signature(s) Signed: 09/14/2021 5:46:02 PM By: Deon Pilling RN, BSN Entered By: Deon Pilling on 09/14/2021 09:22:38 -------------------------------------------------------------------------------- Pain Assessment Details Patient Name: Date of Service: Vanessa Small D. 09/14/2021 9:00 A M Medical Record Number: [id number] Patient Account Number: [id number] Date of Birth/Sex: Treating RN: 09-Mar-1950 (71 y.o. [name] Primary Care Tivis Wherry: Gilles Chiquito Other Clinician: Referring Almee Pelphrey: Treating Agron Swiney/Extender: Aurelio Jew in Treatment: 1 Active Problems Location of Pain Severity and Description of Pain Patient Has Paino Yes Site Locations Pain Location: Pain Location: Pain in Ulcers Rate the pain. Current Pain Level: 7 Pain Management and Medication Current Pain Management: Electronic Signature(s) Signed: 09/14/2021 4:23:23 PM By: Erenest Blank Signed: 09/14/2021 5:46:02 PM By: Deon Pilling RN, BSN Entered By: Erenest Blank on 09/14/2021 09:11:22 -------------------------------------------------------------------------------- Patient/Caregiver Education Details Patient Name: Date of Service: Vanessa Palmer, Vanessa D. 7/20/2023andnbsp9:00 A M Medical Record Number: [id number] Patient Account Number: [id number] Date of Birth/Gender: Treating RN: February 22, 1951 (71 y.o. [name] Primary Care Physician: Gilles Chiquito Other Clinician: Referring Physician: Treating Physician/Extender: Aurelio Jew in Treatment: 1 Education Assessment Education Provided To: Patient Education Topics Provided Wound/Skin Impairment: Handouts: Skin Care Do's and Dont's Methods: Explain/Verbal Responses: Reinforcements needed Electronic Signature(s) Signed: 09/14/2021 5:46:02 PM By: Deon Pilling RN, BSN Entered By: Deon Pilling on  09/14/2021 09:22:48 -------------------------------------------------------------------------------- Wound Assessment Details Patient Name: Date of Service: Vanessa Small D. 09/14/2021 9:00 A M Medical Record Number: [id number] Patient Account Number: [id number] Date of Birth/Sex: Treating RN: 26-Feb-1951 (71 y.o. [name], Meta.Reding Primary Care Analea Muller: Gilles Chiquito Other Clinician: Referring Ashantia Amaral: Treating Oney Tatlock/Extender: Jana Half Weeks in Treatment: 1 Wound Status Wound Number: 1 Primary Etiology: Lymphedema Wound Location: Right, Posterior Lower Leg Wound Status: Open Wounding Event: Blister Date Acquired: 09/01/2021 Weeks Of Treatment: 1 Clustered Wound: Yes Wound Measurements Length: (cm) 0.9 Width: (cm) 0.7 Depth: (cm) 0.1 Area: (cm) 0.495 Volume: (cm) 0.049 % Reduction in Area: 99.5% % Reduction in Volume: 99.5% Wound Description Classification: Full Thickness Without Exposed Support Structu Exudate Amount: Medium Exudate Type: Serosanguineous Exudate Color: red, brown res Treatment Notes Wound #1 (Lower Leg) Wound Laterality: Right, Posterior Cleanser Peri-Wound Care Sween Lotion (Moisturizing lotion) Discharge Instruction: Apply moisturizing lotion as directed Topical Primary Dressing Xeroform Occlusive Gauze Dressing, 4x4 in Discharge Instruction: Apply to wound bed as instructed Secondary Dressing ABD Pad, 8x10 Discharge Instruction: Apply over primary dressing as directed. Secured With Compression Wrap ThreePress (3 layer compression wrap) Discharge Instruction: Apply three layer compression as directed. Compression Stockings Add-Ons Electronic Signature(s) Signed: 09/14/2021 4:23:23 PM By: Erenest Blank Signed: 09/14/2021 5:46:02 PM By: Deon Pilling RN, BSN Entered By: Erenest Blank on 09/14/2021 09:34:19 -------------------------------------------------------------------------------- Wound Assessment  Details Patient Name: Date of Service: Vanessa Small D. 09/14/2021 9:00 A M Medical Record Number: [id number] Patient Account Number: [id number] Date of Birth/Sex: Treating RN: 1950/07/04 (71 y.o. [name], Tammi Klippel Primary Care Enas Winchel: Gilles Chiquito Other Clinician: Referring Ercell Razon: Treating Honor Frison/Extender: Aurelio Jew in Treatment: 1 Wound Status Wound Number: 2 Primary Lymphedema Etiology: Wound Location: Left, Lateral Lower Leg Wound Open Wounding Event: Blister Status: Date Acquired: 09/01/2021 Comorbid Anemia, Asthma, Sleep Apnea, Congestive Heart Failure, Coronary Weeks Of Treatment: 1 History: Artery Disease, Deep Vein Thrombosis, Hypertension, Myocardial Clustered Wound: Yes Infarction, Gout, Osteoarthritis, Seizure Disorder Wound Measurements Length: (cm) Width: (cm) Depth: (cm) Clustered  Quantity: Area: (cm) Volume: (cm) 0 % Reduction in Area: 100% 0 % Reduction in Volume: 100% 0 Epithelialization: Large (67-100%) 0 Tunneling: No 0 Undermining: No 0 Wound Description Classification: Full Thickness Without Exposed Support Structures Wound Margin: Distinct, outline attached Exudate Amount: None Present Foul Odor After Cleansing: No Slough/Fibrino No Wound Bed Granulation Amount: None Present (0%) Exposed Structure Necrotic Amount: None Present (0%) Fascia Exposed: No Fat Layer (Subcutaneous Tissue) Exposed: No Tendon Exposed: No Muscle Exposed: No Joint Exposed: No Bone Exposed: No Electronic Signature(s) Signed: 09/14/2021 4:23:23 PM By: Erenest Blank Signed: 09/14/2021 5:46:02 PM By: Deon Pilling RN, BSN Entered By: Erenest Blank on 09/14/2021 09:27:57 -------------------------------------------------------------------------------- [place] Details Patient Name: Date of Service: Vanessa Palmer, Vanessa Chick D. 09/14/2021 9:00 A M Medical Record Number: [id number] Patient Account Number: [id number] Date of Birth/Sex: Treating  RN: [birth year] (71 y.o. [name], Tammi Klippel Primary Care Tarrin Lebow: Gilles Chiquito Other Clinician: Referring Glenna Brunkow: Treating Bobette Leyh/Extender: Aurelio Jew in Treatment: 1 Vital Signs Time Taken: 09:13 Temperature (F): 97.8 Height (in): 65 Pulse (bpm): 80 Weight (lbs): 274 Respiratory Rate (breaths/min): 18 Body Mass Index (BMI): 45.6 Blood Pressure (mmHg): 152/90 Reference Range: 80 - 120 mg / dl Electronic Signature(s) Signed: 09/14/2021 4:23:23 PM By: Erenest Blank Entered By: Erenest Blank on 09/14/2021 09:14:08

## 2021-09-15 LAB — CBC WITH DIFFERENTIAL/PLATELET
Absolute Monocytes: 359 {cells}/uL (ref 200–950)
Basophils Absolute: 42 {cells}/uL (ref 0–200)
Basophils Relative: 0.8 %
Eosinophils Absolute: 151 {cells}/uL (ref 15–500)
Eosinophils Relative: 2.9 %
HCT: 33.6 % — ABNORMAL LOW (ref 35.0–45.0)
Hemoglobin: 11.3 g/dL — ABNORMAL LOW (ref 11.7–15.5)
Lymphs Abs: 1362 {cells}/uL (ref 850–3900)
MCH: 31 pg (ref 27.0–33.0)
MCHC: 33.6 g/dL (ref 32.0–36.0)
MCV: 92.1 fL (ref 80.0–100.0)
MPV: 11 fL (ref 7.5–12.5)
Monocytes Relative: 6.9 %
Neutro Abs: 3286 {cells}/uL (ref 1500–7800)
Neutrophils Relative %: 63.2 %
Platelets: 203 Thousand/uL (ref 140–400)
RBC: 3.65 Million/uL — ABNORMAL LOW (ref 3.80–5.10)
RDW: 13.1 % (ref 11.0–15.0)
Total Lymphocyte: 26.2 %
WBC: 5.2 Thousand/uL (ref 3.8–10.8)

## 2021-09-15 LAB — SEDIMENTATION RATE: Sed Rate: 55 mm/h — ABNORMAL HIGH (ref 0–30)

## 2021-09-15 LAB — C-REACTIVE PROTEIN: CRP: 6.9 mg/L (ref <8.0)

## 2021-09-21 ENCOUNTER — Encounter (HOSPITAL_BASED_OUTPATIENT_CLINIC_OR_DEPARTMENT_OTHER): Payer: Medicare Other | Admitting: Internal Medicine

## 2021-09-21 DIAGNOSIS — L97822 Non-pressure chronic ulcer of other part of left lower leg with fat layer exposed: Secondary | ICD-10-CM

## 2021-09-21 DIAGNOSIS — L97812 Non-pressure chronic ulcer of other part of right lower leg with fat layer exposed: Secondary | ICD-10-CM

## 2021-09-21 DIAGNOSIS — I87313 Chronic venous hypertension (idiopathic) with ulcer of bilateral lower extremity: Secondary | ICD-10-CM | POA: Diagnosis not present

## 2021-09-21 DIAGNOSIS — I890 Lymphedema, not elsewhere classified: Secondary | ICD-10-CM | POA: Diagnosis not present

## 2021-09-21 NOTE — Progress Notes (Signed)
Vanessa Palmer, Vanessa Palmer ([id number]) Visit Report for 09/21/2021 Chief Complaint Document Details Patient Name: Date of Service: Vanessa Palmer 09/21/2021 9:00 A M Medical Record Number: [id number] Patient Account Number: [id number] Date of Birth/Sex: Treating RN: 09/16/1950 (71 y.o. Vanessa Palmer Primary Care Provider: Gilles Chiquito Other Clinician: Referring Provider: Treating Provider/Extender: Aurelio Jew in Treatment: 2 Information Obtained from: Patient Chief Complaint 09/07/2021; scattered open wounds to the lower extremities bilaterally Electronic Signature(s) Signed: 09/21/2021 12:32:53 PM By: Kalman Shan DO Entered By: Kalman Shan on 09/21/2021 10:13:08 -------------------------------------------------------------------------------- HPI Details Patient Name: Date of Service: Vanessa Palmer, Vanessa Chick D. 09/21/2021 9:00 A M Medical Record Number: [id number] Patient Account Number: [id number] Date of Birth/Sex: Treating RN: 08-Aug-1950 (71 y.o. Vanessa Palmer Primary Care Provider: Gilles Chiquito Other Clinician: Referring Provider: Treating Provider/Extender: Aurelio Jew in Treatment: 2 History of Present Illness HPI Description: Admission 09/07/2021 [name] is a 71 year old female with a past medical history of essential hypertension, chronic kidney disease stage III, and lymphedema that presents to the clinic for a 2-week history of scattered open wounds to her lower extremities bilaterally. She states this has been an ongoing issue for the past year. She states that the wounds wax and wane in healing. She currently takes torsemide. She does not wear compression stockings. She currently denies signs of infection. 7/20; patient presents for follow-up. She received 1 juxta lite compression wrap in the mail. She was supposed to receive 2. She states she talked to the company and they are sending her her second pair. We  used antibiotic ointment and silver alginate under 3 layer compression at last clinic visit. The silver alginate stuck to the wound bed. The left lower extremity wounds have healed. 7/27; patient presents for follow-up. She still has not received her juxta lite compression for the right leg. We spoke with the wound care supply company today and they stated it will arrive today. We have been using Xeroform under 3 layer compression to the right lower extremity. She has been using the juxta lite compression to the left lower extremity. Her wounds remain healed to the left lower extremity. Electronic Signature(s) Signed: 09/21/2021 12:32:53 PM By: Kalman Shan DO Entered By: Kalman Shan on 09/21/2021 10:16:47 -------------------------------------------------------------------------------- Physical Exam Details Patient Name: Date of Service: Vanessa Small D. 09/21/2021 9:00 A M Medical Record Number: [id number] Patient Account Number: [id number] Date of Birth/Sex: Treating RN: 02-22-1951 (71 y.o. Vanessa Palmer Primary Care Provider: Gilles Chiquito Other Clinician: Referring Provider: Treating Provider/Extender: Aurelio Jew in Treatment: 2 Constitutional respirations regular, non-labored and within target range for patient.. Cardiovascular 2+ dorsalis pedis/posterior tibialis pulses. Psychiatric pleasant and cooperative. Notes Right lower extremity: Epithelization to the previous wound sites. Good edema control. Electronic Signature(s) Signed: 09/21/2021 12:32:53 PM By: Kalman Shan DO Entered By: Kalman Shan on 09/21/2021 10:17:23 -------------------------------------------------------------------------------- Physician Orders Details Patient Name: Date of Service: Vanessa Palmer, Vanessa Chick D. 09/21/2021 9:00 A M Medical Record Number: [id number] Patient Account Number: [id number] Date of Birth/Sex: Treating RN: 04/01/1950 (71 y.o. Vanessa Palmer,  Meta.Reding Primary Care Provider: Gilles Chiquito Other Clinician: Referring Provider: Treating Provider/Extender: Aurelio Jew in Treatment: 2 Verbal / Phone Orders: No Diagnosis Coding ICD-10 Coding Code Description [phone] Non-pressure chronic ulcer of other part of right lower leg with fat layer exposed L97.822 Non-pressure chronic ulcer of other part of left lower leg with fat layer exposed I89.0 Lymphedema, not elsewhere classified I87.313 Chronic venous hypertension (  idiopathic) with ulcer of bilateral lower extremity Discharge From [facility] Discharge from Wound Care Center - Call if any future wound care needs.] Call [facility] on [address] to schedule an appt to be fitting for custom stockings. Edema Control - Lymphedema / SCD / Other Elevate legs to the level of the heart or above for 30 minutes daily and/or when sitting, a frequency of: - 3-4 times a day throughout the day. Avoid standing for long periods of time. Exercise regularly Moisturize legs daily. - lotion both legs every night before bed. Compression stocking or Garment 30-40 mm/Hg pressure to: - apply in the morning and remove at night to both legs. Electronic Signature(s) Signed: 09/21/2021 12:32:53 PM By: Geralyn Corwin DO Entered By: Geralyn Corwin on 09/21/2021 10:17:29 -------------------------------------------------------------------------------- Problem List Details Patient Name: Date of Service: Vanessa Palmer, [name] 09/21/2021 9:00 A M Medical Record Number: [id number] Patient Account Number: [id number] Date of Birth/Sex: Treating RN: [birth year] (71 y.o. Vanessa Palmer Primary Care Provider: [name] Other Clinician: Referring Provider: Treating Provider/Extender: [name] in Treatment: 2 Active Problems ICD-10 Encounter Code Description Active Date MDM Diagnosis [phone] Non-pressure chronic ulcer of other part of right lower leg with  fat layer 09/07/2021 No Yes exposed L97.822 Non-pressure chronic ulcer of other part of left lower leg with fat layer exposed7/13/2023 No Yes I89.0 Lymphedema, not elsewhere classified 09/07/2021 No Yes I87.313 Chronic venous hypertension (idiopathic) with ulcer of bilateral lower extremity 09/07/2021 No Yes Inactive Problems Resolved Problems Electronic Signature(s) Signed: 09/21/2021 12:32:53 PM By: Geralyn Corwin DO Entered By: Geralyn Corwin on 09/21/2021 10:12:55 -------------------------------------------------------------------------------- Progress Note Details Patient Name: Date of Service: Vanessa Rad D. 09/21/2021 9:00 A M Medical Record Number: [id number] Patient Account Number: [id number] Date of Birth/Sex: Treating RN: 1950/03/14 (71 y.o. Vanessa Palmer Primary Care Provider: [name] Other Clinician: Referring Provider: Treating Provider/Extender: [name] in Treatment: 2 Subjective Chief Complaint Information obtained from Patient 09/07/2021; scattered open wounds to the lower extremities bilaterally History of Present Illness (HPI) Admission 09/07/2021 Ms. Kathe Passarelli is a 71 year old female with a past medical history of essential hypertension, chronic kidney disease stage III, and lymphedema that presents to the clinic for a 2-week history of scattered open wounds to her lower extremities bilaterally. She states this has been an ongoing issue for the past year. She states that the wounds wax and wane in healing. She currently takes torsemide. She does not wear compression stockings. She currently denies signs of infection. 7/20; patient presents for follow-up. She received 1 juxta lite compression wrap in the mail. She was supposed to receive 2. She states she talked to the company and they are sending her her second pair. We used antibiotic ointment and silver alginate under 3 layer compression at last clinic visit. The  silver alginate stuck to the wound bed. The left lower extremity wounds have healed. 7/27; patient presents for follow-up. She still has not received her juxta lite compression for the right leg. We spoke with the wound care supply company today and they stated it will arrive today. We have been using Xeroform under 3 layer compression to the right lower extremity. She has been using the juxta lite compression to the left lower extremity. Her wounds remain healed to the left lower extremity. Patient History Information obtained from Chart. Family History Diabetes - Siblings, Heart Disease - Father,Siblings. Social History Never smoker, Alcohol Use - Rarely - sober since 2006. Medical History Hematologic/Lymphatic  Patient has history of Anemia Respiratory Patient has history of Asthma, Sleep Apnea - 2018- oCPAP Cardiovascular Patient has history of Congestive Heart Failure, Coronary Artery Disease, Deep Vein Thrombosis - 2014 both legs, Hypertension, Myocardial Infarction - 2005 Musculoskeletal Patient has history of Gout, Osteoarthritis Neurologic Patient has history of Seizure Disorder - as a teenager Hospitalization/Surgery History - heart cath 2018, 2003, 2004, 2006. - right total hip replacement 2016. - hysterectomy. Medical A Surgical History Notes nd Constitutional Symptoms (General Health) chronic back pain Migraine last 2018 Cardiovascular hyperlipidemia Gastrointestinal GERD Genitourinary stage III CKD Musculoskeletal DJD Psychiatric depression Objective Constitutional respirations regular, non-labored and within target range for patient.. Vitals Time Taken: 9:25 AM, Height: 65 in, Weight: 274 lbs, BMI: 45.6, Temperature: 98.2 F, Pulse: 65 bpm, Respiratory Rate: 20 breaths/min, Blood Pressure: 160/93 mmHg. Cardiovascular 2+ dorsalis pedis/posterior tibialis pulses. Psychiatric pleasant and cooperative. General Notes: Right lower extremity: Epithelization to  the previous wound sites. Good edema control. Integumentary (Hair, Skin) Wound #1 status is Healed - Epithelialized. Original cause of wound was Blister. The date acquired was: 09/01/2021. The wound has been in treatment 2 weeks. The wound is located on the Right,Posterior Lower Leg. The wound measures 0cm length x 0cm width x 0cm depth; 0cm^2 area and 0cm^3 volume. There is no tunneling or undermining noted. There is a none present amount of drainage noted. The wound margin is distinct with the outline attached to the wound base. There is no granulation within the wound bed. There is no necrotic tissue within the wound bed. Assessment Active Problems ICD-10 Non-pressure chronic ulcer of other part of right lower leg with fat layer exposed Non-pressure chronic ulcer of other part of left lower leg with fat layer exposed Lymphedema, not elsewhere classified Chronic venous hypertension (idiopathic) with ulcer of bilateral lower extremity Patient has done well with Xeroform and compression therapy. Her wounds are closed on the right side. She has been wearing her juxta lite compression to the left lower extremity without issues. Her wounds remain closed here. I recommended she use her juxta lite compressions daily. In case She does not receive her juxta lite in the mail I gave her a prescription for custom compression garments and information to go to [facility]" T have these made. o Plan Discharge From [facility] Services: Discharge from Wound Care Center - Call if any future wound care needs.] Call [facility] on [address] to schedule an appt to be fitting for custom stockings. Edema Control - Lymphedema / SCD / Other: Elevate legs to the level of the heart or above for 30 minutes daily and/or when sitting, a frequency of: - 3-4 times a day throughout the day. Avoid standing for long periods of time. Exercise regularly Moisturize legs daily. - lotion both legs every night before  bed. Compression stocking or Garment 30-40 mm/Hg pressure to: - apply in the morning and remove at night to both legs. 1. Juxta lite compression garment daily 2. Discharge from clinic due to closed wound 3. Follow-up as needed Electronic Signature(s) Signed: 09/21/2021 12:32:53 PM By: Geralyn Corwin DO Entered By: Geralyn Corwin on 09/21/2021 10:21:15 -------------------------------------------------------------------------------- HxROS Details Patient Name: Date of Service: Vanessa Palmer, Vanessa D. 09/21/2021 9:00 A M Medical Record Number: [id number] Patient Account Number: [id number] Date of Birth/Sex: Treating RN: October 05, 1950 (71 y.o. Vanessa Palmer Primary Care Provider: [name] Other Clinician: Referring Provider: Treating Provider/Extender: [name] in Treatment: 2 Information Obtained From Chart Constitutional Symptoms (General Health) Medical  History: Past Medical History Notes: chronic back pain Migraine last 2018 Hematologic/Lymphatic Medical History: Positive for: Anemia Respiratory Medical History: Positive for: Asthma; Sleep Apnea - 2018- oCPAP Cardiovascular Medical History: Positive for: Congestive Heart Failure; Coronary Artery Disease; Deep Vein Thrombosis - 2014 both legs; Hypertension; Myocardial Infarction - 2005 Past Medical History Notes: hyperlipidemia Gastrointestinal Medical History: Past Medical History Notes: GERD Genitourinary Medical History: Past Medical History Notes: stage III CKD Musculoskeletal Medical History: Positive for: Gout; Osteoarthritis Past Medical History Notes: DJD Neurologic Medical History: Positive for: Seizure Disorder - as a teenager Psychiatric Medical History: Past Medical History Notes: depression Immunizations Pneumococcal Vaccine: Received Pneumococcal Vaccination: No Implantable Devices No devices added Hospitalization / Surgery History Type of  Hospitalization/Surgery heart cath 2018, 2003, 2004, 2006 right total hip replacement 2016 hysterectomy Family and Social History Diabetes: Yes - Siblings; Heart Disease: Yes - Father,Siblings; Never smoker; Alcohol Use: Rarely - sober since 2006 Electronic Signature(s) Signed: 09/21/2021 12:32:53 PM By: Kalman Shan DO Signed: 09/21/2021 4:23:35 PM By: Deon Pilling RN, BSN Entered By: Kalman Shan on 09/21/2021 10:16:51 -------------------------------------------------------------------------------- SuperBill Details Patient Name: Date of Service: Vanessa Palmer, Vanessa Chick D. 09/21/2021 Medical Record Number: [id number] Patient Account Number: [id number] Date of Birth/Sex: Treating RN: 09/14/1950 (71 y.o. 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 L97.822 Non-pressure chronic ulcer of other part of left lower leg with fat layer exposed I89.0 Lymphedema, not elsewhere classified I87.313 Chronic venous hypertension (idiopathic) with ulcer of bilateral lower extremity Facility Procedures CPT4 Code: [id number] Description: 99213 - WOUND CARE VISIT-LEV 3 EST PT Modifier: Quantity: 1 Physician Procedures : CPT4 Code Description Modifier [id number] - WC PHYS LEVEL 3 - EST PT ICD-10 Diagnosis Description [id number] Non-pressure chronic ulcer of other part of right lower leg with fat layer exposed L97.822 Non-pressure chronic ulcer of other part of left  lower leg with fat layer exposed I89.0 Lymphedema, not elsewhere classified I87.313 Chronic venous hypertension (idiopathic) with ulcer of bilateral lower extremity Quantity: 1 Electronic Signature(s) Signed: 09/21/2021 12:32:53 PM By: Kalman Shan DO Signed: 09/21/2021  4:23:35 PM By: Deon Pilling RN, BSN Entered By: Deon Pilling on 09/21/2021 11:24:42

## 2021-09-21 NOTE — Progress Notes (Signed)
DINITA, MIGLIACCIO ([id number]) Visit Report for 09/21/2021 Arrival Information Details Patient Name: Date of Service: Vanessa Palmer, Vanessa Palmer 09/21/2021 9:00 A M Medical Record Number: [id number] Patient Account Number: [id number] Date of Birth/Sex: Treating RN: 1950-08-12 (71 y.o. Vanessa Palmer, Vanessa Palmer Primary Care Roanna Reaves: [name] Other Clinician: Referring Aaniya Sterba: Treating Vanessa Palmer/Extender: [name] in Treatment: 2 Visit Information History Since Last Visit Added or deleted any medications: No Patient Arrived: Wheel Chair Any new allergies or adverse reactions: No Arrival Time: 09:25 Had a fall or experienced change in No Accompanied By: daughter activities of daily living that may affect Transfer Assistance: None risk of falls: Patient Identification Verified: Yes Signs or symptoms of abuse/neglect since last visito No Secondary Verification Process Completed: Yes Hospitalized since last visit: No Patient Requires Transmission-Based Precautions: No Implantable device outside of the clinic excluding No Patient Has Alerts: Yes cellular tissue based products placed in the Palmer Patient Alerts: Patient on Blood Thinner since last visit: Has Dressing in Place as Prescribed: Yes Has Compression in Place as Prescribed: Yes Pain Present Now: No Electronic Signature(s) Signed: 09/21/2021 4:23:35 PM By: Shawn Stall RN, BSN Entered By: Shawn Stall on 09/21/2021 09:26:02 -------------------------------------------------------------------------------- Clinic Level of Care Assessment Details Patient Name: Date of Service: Vanessa Palmer, Vanessa Breath D. 09/21/2021 9:00 A M Medical Record Number: [id number] Patient Account Number: [id number] Date of Birth/Sex: Treating RN: 1951-02-25 (71 y.o. Vanessa Palmer, Vanessa Palmer Primary Care Myrl Lazarus: [name] Other Clinician: Referring Vanessa Palmer: Treating Vanessa Palmer/Extender: [name] in Treatment:  2 Clinic Level of Care Assessment Items TOOL 4 Quantity Score X- 1 0 Use when only an EandM is performed on FOLLOW-UP visit ASSESSMENTS - Nursing Assessment / Reassessment X- 1 10 Reassessment of Co-morbidities (includes updates in patient status) X- 1 5 Reassessment of Adherence to Treatment Plan ASSESSMENTS - Wound and Skin A ssessment / Reassessment X - Simple Wound Assessment / Reassessment - one wound 1 5 []  - 0 Complex Wound Assessment / Reassessment - multiple wounds X- 1 10 Dermatologic / Skin Assessment (not related to wound area) ASSESSMENTS - Focused Assessment X- 1 5 Circumferential Edema Measurements - multi extremities []  - 0 Nutritional Assessment / Counseling / Intervention []  - 0 Lower Extremity Assessment (monofilament, tuning fork, pulses) []  - 0 Peripheral Arterial Disease Assessment (using hand held doppler) ASSESSMENTS - Ostomy and/or Continence Assessment and Care []  - 0 Incontinence Assessment and Management []  - 0 Ostomy Care Assessment and Management (repouching, etc.) PROCESS - Coordination of Care X - Simple Patient / Family Education for ongoing care 1 15 []  - 0 Complex (extensive) Patient / Family Education for ongoing care X- 1 10 Staff obtains , Records, T Results / Process Orders est []  - 0 Staff telephones HHA, Nursing Homes / Clarify orders / etc []  - 0 Routine Transfer to another Facility (non-emergent condition) []  - 0 Routine Hospital Admission (non-emergent condition) []  - 0 New Admissions / / Ordering NPWT Apligraf, etc. , []  - 0 Emergency Hospital Admission (emergent condition) X- 1 10 Simple Discharge Coordination []  - 0 Complex (extensive) Discharge Coordination PROCESS - Special Needs []  - 0 Pediatric / Minor Patient Management []  - 0 Isolation Patient Management []  - 0 Hearing / Language / Visual special needs []  - 0 Assessment of Community assistance (transportation, D/C  planning, etc.) []  - 0 Additional assistance / Altered mentation []  - 0 Support Surface(s) Assessment (bed, cushion, seat, etc.) INTERVENTIONS - Wound Cleansing / Measurement X - Simple  Wound Cleansing - one wound 1 5 []  - 0 Complex Wound Cleansing - multiple wounds X- 1 5 Wound Imaging (photographs - any number of wounds) []  - 0 Wound Tracing (instead of photographs) X- 1 5 Simple Wound Measurement - one wound []  - 0 Complex Wound Measurement - multiple wounds INTERVENTIONS - Wound Dressings []  - 0 Small Wound Dressing one or multiple wounds []  - 0 Medium Wound Dressing one or multiple wounds []  - 0 Large Wound Dressing one or multiple wounds []  - 0 Application of Medications - topical []  - 0 Application of Medications - injection INTERVENTIONS - Miscellaneous []  - 0 External ear exam []  - 0 Specimen Collection (cultures, biopsies, blood, body fluids, etc.) []  - 0 Specimen(s) / Culture(s) sent or taken to Lab for analysis []  - 0 Patient Transfer (multiple staff / / Similar devices) []  - 0 Simple Staple / Suture removal (25 or less) []  - 0 Complex Staple / Suture removal (26 or more) []  - 0 Hypo / Hyperglycemic Management (close monitor of Blood Glucose) []  - 0 Ankle / Brachial Index (ABI) - do not check if billed separately X- 1 5 Vital Signs Has the patient been seen at the hospital within the last three years: Yes Total Score: 90 Level Of Care: New/Established - Level 3 Electronic Signature(s) Signed: 09/21/2021 4:23:35 PM By: RN, BSN Entered By: on 09/21/2021 11:24:37 -------------------------------------------------------------------------------- Encounter Discharge Information Details Patient Name: Date of Service: D. 09/21/2021 9:00 A M Medical Record Number: Patient Account Number: Date of Birth/Sex: Treating RN: 1950/05/25 (71 y.o. Primary Care Morgana Rowley:  Nurse, adult Other Clinician: Referring Coni Homesley: Treating Vanessa Palmer/Extender: in Treatment: 2 Encounter Discharge Information Items Discharge Condition: Stable Ambulatory Status: Ambulatory Discharge Destination: Home Transportation: Private Auto Accompanied By: self Schedule Follow-up Appointment: Yes Clinical Summary of Care: Electronic Signature(s) Signed: 09/21/2021 4:23:35 PM By: RN, BSN Entered By: on 09/21/2021 11:25:38 -------------------------------------------------------------------------------- Lower Extremity Assessment Details Patient Name: Date of Service: Shawn Stall D. 09/21/2021 9:00 A M Medical Record Number: 09/23/2021 Patient Account Number: Sherri Rad Date of Birth/Sex: Treating RN: Oct 22, 1950 (71 y.o. [id number], 12/21/1950 Primary Care Felma Pfefferle: (60 Other Clinician: Referring Teneshia Hedeen: Treating Jylan Loeza/Extender: [name] Weeks in Treatment: 2 Edema Assessment Assessed: [Left: No] [Right: Yes] Edema: [Left: Yes] [Right: Yes] Calf Left: Right: Point of Measurement: 33 cm From Medial Instep 48.5 cm Ankle Left: Right: Point of Measurement: 9 cm From Medial Instep 25.5 cm Vascular Assessment Pulses: Dorsalis Pedis Palpable: [Right:Yes] Electronic Signature(s) Signed: 09/21/2021 4:23:35 PM By: [name] RN, BSN Entered By: 09/23/2021 on 09/21/2021 09:33:28 -------------------------------------------------------------------------------- Multi Wound Chart Details Patient Name: Date of Service: Shawn Stall D. 09/21/2021 9:00 A M Medical Record Number: Sherri Rad Patient Account Number: 09/23/2021 Date of Birth/Sex: Treating RN: [birth year] (71 y.o. 12/21/1950 Primary Care Adonte Vanriper: 75 Other Clinician: Referring Adria Costley: Treating Corissa Oguinn/Extender: Vanessa Palmer in Treatment: 2 Vital Signs Height(in):  65 Pulse(bpm): 65 Weight(lbs): 274 Blood Pressure(mmHg): 160/93 Body Mass Index(BMI): 45.6 Temperature(F): 98.2 Respiratory Rate(breaths/min): 20 Photos: [N/A:N/A] Right, Posterior Lower Leg N/A N/A Wound Location: Blister N/A N/A Wounding Event: Lymphedema N/A N/A Primary Etiology: Anemia, Asthma, Sleep Apnea, N/A N/A Comorbid History: Congestive Heart Failure, Coronary Artery Disease, Deep Vein Thrombosis, Hypertension, Myocardial Infarction, Gout, Osteoarthritis, Seizure Disorder 09/01/2021 N/A N/A Date Acquired: 2 N/A N/A Weeks of Treatment: Healed -  Epithelialized N/A N/A Wound Status: No N/A N/A Wound Recurrence: Yes N/A N/A Clustered Wound: 0x0x0 N/A N/A Measurements L x W x D (cm) 0 N/A N/A A (cm) : rea 0 N/A N/A Volume (cm) : 100.00% N/A N/A % Reduction in Area: 100.00% N/A N/A % Reduction in Volume: Full Thickness Without Exposed N/A N/A Classification: Support Structures None Present N/A N/A Exudate Amount: Distinct, outline attached N/A N/A Wound Margin: None Present (0%) N/A N/A Granulation Amount: None Present (0%) N/A N/A Necrotic Amount: Fascia: No N/A N/A Exposed Structures: Fat Layer (Subcutaneous Tissue): No Tendon: No Muscle: No Joint: No Bone: No Large (67-100%) N/A N/A Epithelialization: Treatment Notes Electronic Signature(s) Signed: 09/21/2021 12:32:53 PM By: Geralyn Corwin DO Signed: 09/21/2021 4:23:35 PM By: Shawn Stall RN, BSN Entered By: Geralyn Corwin on 09/21/2021 10:12:59 -------------------------------------------------------------------------------- Multi-Disciplinary Care Plan Details Patient Name: Date of Service: [name], Vanessa Breath D. 09/21/2021 9:00 A M Medical Record Number: [id number] Patient Account Number: [id number] Date of Birth/Sex: Treating RN: 1950-11-29 (71 y.o. [name] Primary Care Garhett Bernhard: [name] Other Clinician: Referring Marianna Cid: Treating Bellina Tokarczyk/Extender: [name] in Treatment: 2 Active Inactive Electronic Signature(s) Signed: 09/21/2021 4:23:35 PM By: Shawn Stall RN, BSN Entered By: Shawn Stall on 09/21/2021 11:23:48 -------------------------------------------------------------------------------- Pain Assessment Details Patient Name: Date of Service: [name] 09/21/2021 9:00 A M Medical Record Number: [id number] Patient Account Number: [id number] Date of Birth/Sex: Treating RN: January 20, 1951 (71 y.o. [name] Primary Care Talena Neira: [name] Other Clinician: Referring Quirino Kakos: Treating Aileana Hodder/Extender: [name] in Treatment: 2 Active Problems Location of Pain Severity and Description of Pain Patient Has Paino Yes Site Locations Pain Location: Generalized Pain Rate the pain. Current Pain Level: 10 Worst Pain Level: 10 Least Pain Level: 0 Tolerable Pain Level: 8 Pain Management and Medication Current Pain Management: Medication: No Cold Application: No Rest: No Massage: No Activity: No T.E.N.S.: No Heat Application: No Leg drop or elevation: No Is the Current Pain Management Adequate: Adequate How does your wound impact your activities of daily livingo Sleep: No Bathing: No Appetite: No Relationship With Others: No Bladder Continence: No Emotions: No Bowel Continence: No Work: No Toileting: No Drive: No Dressing: No Hobbies: No Psychologist, prison and probation services) Signed: 09/21/2021 4:23:35 PM By: Shawn Stall RN, BSN Entered By: Shawn Stall on 09/21/2021 09:27:56 -------------------------------------------------------------------------------- Patient/Caregiver Education Details Patient Name: Date of Service: SIMMO NS, Taylormarie D. 7/27/2023andnbsp9:00 A M Medical Record Number: [id number] Patient Account Number: [id number] Date of Birth/Gender: Treating RN: [birth year] (71 y.o. [name] Primary Care Physician: [name] Other  Clinician: Referring Physician: Treating Physician/Extender: [name] in Treatment: 2 Education Assessment Education Provided To: Patient and Caregiver daughter Education Topics Provided Venous: Handouts: Controlling Swelling with Compression Stockings Methods: Explain/Verbal Responses: Reinforcements needed Electronic Signature(s) Signed: 09/21/2021 4:23:35 PM By: Shawn Stall RN, BSN Entered By: Shawn Stall on 09/21/2021 11:24:01 -------------------------------------------------------------------------------- Wound Assessment Details Patient Name: Date of Service: [name] 09/21/2021 9:00 A M Medical Record Number: [id number] Patient Account Number: [id number] Date of Birth/Sex: Treating RN: 01-24-1951 (71 y.o. Vanessa Palmer, Vanessa Palmer Primary Care Kimm Sider: [name] Other Clinician: Referring Nicasio Barlowe: Treating Ozelle Brubacher/Extender: Clarisa Fling Weeks in Treatment: 2 Wound Status Wound Number: 1 Primary Lymphedema Etiology: Wound Location: Right, Posterior Lower Leg Wound Healed - Epithelialized Wounding Event: Blister Wounding Event: Blister Status: Date Acquired: 09/01/2021 Comorbid Anemia, Asthma, Sleep Apnea, Congestive Heart Failure, Coronary Weeks Of Treatment: 2 History: Artery Disease, Deep Vein  Thrombosis, Hypertension, Myocardial Clustered Wound: Yes Infarction, Gout, Osteoarthritis, Seizure Disorder Photos Wound Measurements Length: (cm) Width: (cm) Depth: (cm) Area: (cm) Volume: (cm) 0 % Reduction in Area: 100% 0 % Reduction in Volume: 100% 0 Epithelialization: Large (67-100%) 0 Tunneling: No 0 Undermining: No Wound Description Classification: Full Thickness Without Exposed Support Structures Wound Margin: Distinct, outline attached Exudate Amount: None Present Foul Odor After Cleansing: No Slough/Fibrino No Wound Bed Granulation Amount: None Present (0%) Exposed Structure Necrotic Amount:  None Present (0%) Fascia Exposed: No Fat Layer (Subcutaneous Tissue) Exposed: No Tendon Exposed: No Muscle Exposed: No Joint Exposed: No Bone Exposed: No Electronic Signature(s) Signed: 09/21/2021 4:23:35 PM By: Shawn Stall RN, BSN Entered By: Shawn Stall on 09/21/2021 09:34:33 -------------------------------------------------------------------------------- Vitals Details Patient Name: Date of Service: [name], Deseri D. 09/21/2021 9:00 A M Medical Record Number: [id number] Patient Account Number: [id number] Date of Birth/Sex: Treating RN: Nov 28, 1950 (71 y.o. Vanessa Palmer, Vanessa Palmer Primary Care Akhil Piscopo: [name] Other Clinician: Referring Bemnet Trovato: Treating Sharika Mosquera/Extender: [name] in Treatment: 2 Vital Signs Time Taken: 09:25 Temperature (F): 98.2 Height (in): 65 Pulse (bpm): 65 Weight (lbs): 274 Respiratory Rate (breaths/min): 20 Body Mass Index (BMI): 45.6 Blood Pressure (mmHg): 160/93 Reference Range: 80 - 120 mg / dl Electronic Signature(s) Signed: 09/21/2021 4:23:35 PM By: Shawn Stall RN, BSN Entered By: Shawn Stall on 09/21/2021 09:27:38

## 2021-09-27 ENCOUNTER — Other Ambulatory Visit: Payer: Self-pay | Admitting: Family Medicine

## 2021-09-27 ENCOUNTER — Ambulatory Visit
Admission: RE | Admit: 2021-09-27 | Discharge: 2021-09-27 | Disposition: A | Discharge status: Home / Self Care | Payer: Medicare Other | Source: Ambulatory Visit | Attending: *Deleted | Admitting: *Deleted

## 2021-09-27 ENCOUNTER — Ambulatory Visit
Admission: RE | Admit: 2021-09-27 | Discharge: 2021-09-27 | Disposition: A | Discharge status: Home / Self Care | Payer: Medicaid Other | Source: Ambulatory Visit | Attending: *Deleted | Admitting: *Deleted

## 2021-09-27 DIAGNOSIS — N6320 Unspecified lump in the left breast, unspecified quadrant: Secondary | ICD-10-CM

## 2021-10-02 ENCOUNTER — Encounter (HOSPITAL_COMMUNITY)
Admission: RE | Admit: 2021-10-02 | Discharge: 2021-10-02 | Disposition: A | Discharge status: Home / Self Care | Payer: Medicare Other | Source: Ambulatory Visit | Attending: Physician Assistant | Admitting: Physician Assistant

## 2021-10-02 DIAGNOSIS — Z96641 Presence of right artificial hip joint: Secondary | ICD-10-CM | POA: Insufficient documentation

## 2021-10-02 MED ORDER — TECHNETIUM TC 99M MEDRONATE IV KIT
20.0000 | PACK | Freq: Once | INTRAVENOUS | Status: AC | PRN
  Administered 2021-10-02: 20 via INTRAVENOUS

## 2021-10-05 ENCOUNTER — Ambulatory Visit (INDEPENDENT_AMBULATORY_CARE_PROVIDER_SITE_OTHER): Payer: Medicare Other | Admitting: Physician Assistant

## 2021-10-05 ENCOUNTER — Ambulatory Visit (INDEPENDENT_AMBULATORY_CARE_PROVIDER_SITE_OTHER): Payer: Medicare Other

## 2021-10-05 ENCOUNTER — Encounter: Payer: Self-pay | Admitting: Physician Assistant

## 2021-10-05 DIAGNOSIS — M5416 Radiculopathy, lumbar region: Secondary | ICD-10-CM

## 2021-10-05 DIAGNOSIS — M25551 Pain in right hip: Secondary | ICD-10-CM

## 2021-10-05 NOTE — Progress Notes (Signed)
HPI: Mrs. Bertagnolli returns today to go over the bone scan.  Also to review her lab work.  She continues to have pain in her right groin area.  History of right total hip arthroplasty 2016.  MRI CT studies have been negative for any signs of loosening or signs of infection.  She states she does minimal ambulation secondary to the pain in the hip.  Today she does state that she has pain down the right leg and numbness and tingling in the right foot. She denied any radicular symptoms in the past due to neuropathy.  She also notes that she has been told in the past that she may have back surgery.  Denies any change in bowel or bladder function or saddle anesthesia like symptoms. Bone scans reviewed with the patient that shows no evidence of right hip loosening or infection.  Sed rate was elevated at 55 however it has been as high as 125 over the past 13 years.  See reactive protein was normal at 6.9 mg/L.  White blood count was 5200.  Review of systems: See HPI otherwise negative or noncontributory.  Physical exam: General: Well-developed well-nourished female seated in wheelchair no acute distress.  Mood and affect appropriate. Psych: Alert and oriented x 3  Lower extremities: Bilateral hips she has good range of motion of both hips.  Pain with extreme internal rotation of the right hip.  Lower extremity strength testing reveals 5 out of 5 strength throughout except for hip flexion which is 4 out of 5 bilaterally.  Positive straight leg raise on the right negative on the left.  Radiographs: Lumbar spine 2 views: Loss of lordotic curvature.  Grade 1 spinal listhesis L4 on L5.  Diminished disc space at L4-5.  Lower lumbar facet changes.  No acute fractures.  No acute findings.  Impression: Right hip pain History of right total hip arthroplasty 2016 Lumbar radiculopathy  Plan: Recommend patient go to formal physical therapy for range of motion lumbar spine and right hip, gait balance training, stretching  hamstrings IT band bilaterally, back exercises, home exercise program and modalities.  Will see her back in 5 to 6 weeks see how she is doing overall.  Questions were encouraged and answered at length

## 2021-10-06 ENCOUNTER — Other Ambulatory Visit: Payer: Self-pay

## 2021-10-06 ENCOUNTER — Telehealth: Payer: Self-pay | Admitting: Orthopaedic Surgery

## 2021-10-06 MED ORDER — TIZANIDINE HCL 4 MG PO TABS: ORAL_TABLET | ORAL | 0 refills | Status: DC

## 2021-10-06 NOTE — Addendum Note (Signed)
Addended by: Barbette Or on: 10/06/2021 01:34 PM   Modules accepted: Orders

## 2021-10-06 NOTE — Telephone Encounter (Signed)
Rx refill Tizanidine    [facility] on [address]

## 2021-10-20 ENCOUNTER — Ambulatory Visit: Payer: Medicare Other | Admitting: Physical Therapy

## 2021-10-25 NOTE — Therapy (Addendum)
OUTPATIENT PHYSICAL THERAPY THORACOLUMBAR EVALUATION/DC SUMMARY   Patient Name: Vanessa Palmer MRN: [id number] DOB:1950/03/16, 71 y.o., female Today's Date: 10/26/2021 PHYSICAL THERAPY DISCHARGE SUMMARY  Visits from Start of Care: 1  Current functional level related to goals / functional outcomes: UTA   Remaining deficits: UTA   Education / Equipment: HEP   Patient agrees to discharge. Patient goals were not met. Patient is being discharged due to not returning since the last visit.   PT End of Session - 10/26/21 1300     Visit Number 1    Number of Visits 8    Date for PT Re-Evaluation 12/26/21    Authorization Type [employer]    PT Start Time 1200    PT Stop Time 1300    PT Time Calculation (min) 60 min    Activity Tolerance Patient tolerated treatment well;No increased pain    Behavior During Therapy [facility] for tasks assessed/performed             Past Medical History:  Diagnosis Date   ALCOHOL ABUSE 12/13/2005   Annotation: Sober since 11/06 Qualifier: Diagnosis of  By: Pearline Cables MD, Belenda Cruise     Anemia    Arthritis    "all over" (02/21/2017)   CAD (coronary artery disease)    s/p non-Q wave MI in 06/2001 and 2004, 2005   CHF (congestive heart failure) (HCC)    Chronic kidney disease (CKD), stage III (moderate) ([place]) 09/19/2010   Chronic mid back pain    Depression    DJD (degenerative joint disease)    DVT (deep venous thrombosis) (HCC)    BLE   DVT of lower extremity, bilateral ([place]) 04/04/2012   On Xarelto     GERD (gastroesophageal reflux disease)    Gout    Hyperlipidemia    Hypertension    INTRINSIC ASTHMA, WITH EXACERBATION 09/27/2009   Qualifier: Diagnosis of  By: Stanford Scotland MD, Nodira     Migraine    "none anymore" (02/21/2017)   Myocardial infarction ([place]) ?2005   Obesity    OSA (obstructive sleep apnea)    previously used CPAP, has lost it (02/21/2017)   Seizures ([place])    As a teenager    Past Surgical History:  Procedure Laterality Date   ABDOMINAL  HYSTERECTOMY     partial; due to endometriosis   CARDIAC CATHETERIZATION  06/2001, 02/2002, 10/2004    (10-15% proximal stenosis of circumflex, diffuse disease of  OM1 and RCA)   CARDIAC CATHETERIZATION  02/21/2017   COLONOSCOPY     JOINT REPLACEMENT     KNEE ARTHROSCOPY Left    LEFT HEART CATH AND CORONARY ANGIOGRAPHY N/A 02/21/2017   Procedure: LEFT HEART CATH AND CORONARY ANGIOGRAPHY;  Surgeon: Charolette Forward, MD;  Location: [facility];  Service: Cardiovascular;  Laterality: N/A;   TOTAL HIP ARTHROPLASTY Right 11/23/2014   Procedure: RIGHT TOTAL HIP ARTHROPLASTY ANTERIOR APPROACH;  Surgeon: Mcarthur Rossetti, MD;  Location: [place];  Service: Orthopedics;  Laterality: Right;   TUBAL LIGATION     Patient Active Problem List   Diagnosis Date Noted   Anxiety 10/21/2018   Shellfish allergy 03/19/2018   Unilateral primary osteoarthritis, left knee 04/29/2017   Unilateral primary osteoarthritis, right knee 04/29/2017   Chronic stable angina ([place]) 02/21/2017   LVH (left ventricular hypertrophy) 08/15/2016   Vitamin D deficiency 05/04/2016   Status post total replacement of right hip 11/23/2014   Osteoarthritis of right hip 10/13/2014   Nocturnal leg cramps 01/06/2014   Bilateral  lower extremity edema 06/02/2013   History of DVT (deep vein thrombosis), multiple, lifelong anticoagulation 04/04/2012   Routine health maintenance 01/14/2012   GERD (gastroesophageal reflux disease) 01/09/2012   Urinary incontinence 12/31/2011   Chronic kidney disease (CKD), stage III (moderate) ([place]) 09/19/2010   Asthma, chronic 09/27/2009   Elevated alkaline phosphatase level 08/01/2009   Pain in joint, multiple sites 12/21/2008   MAJOR DPRSV DISORDER RECURRENT EPISODE MODERATE 09/08/2008   Gout 07/05/2008   Hyperlipidemia 05/07/2006   Coronary atherosclerosis 02/05/2006   Morbid obesity ([place]) 12/13/2005   History of alcohol abuse 12/13/2005   SLEEP APNEA, OBSTRUCTIVE 12/13/2005   Essential  hypertension 12/13/2005    PCP: Thomes Dinning, MD  REFERRING PROVIDER: Pete Pelt, PA-C  REFERRING DIAG: [phone] (ICD-10-CM) - Pain in right hip M54.16 (ICD-10-CM) - Radiculopathy, lumbar region   Rationale for Evaluation and Treatment Rehabilitation  THERAPY DIAG: Pain in right hip, Radiculopathy, lumbar region    ONSET DATE: 10/06/2021   SUBJECTIVE:                                                                                                                                                                                           SUBJECTIVE STATEMENT: Reports R groin pain over a year long history, worsening with time. PERTINENT HISTORY:  Lower extremities: Bilateral hips she has good range of motion of both hips.  Pain with extreme internal rotation of the right hip.  Lower extremity strength testing reveals 5 out of 5 strength throughout except for hip flexion which is 4 out of 5 bilaterally.  Positive straight leg raise on the right negative on the left.   Radiographs: Lumbar spine 2 views: Loss of lordotic curvature.  Grade 1 spinal listhesis L4 on L5.  Diminished disc space at L4-5.  Lower lumbar facet changes.  No acute fractures.  No acute findings.   Impression: Right hip pain History of right total hip arthroplasty 2016 Lumbar radiculopathy   Plan: Recommend patient go to formal physical therapy for range of motion lumbar spine and right hip, gait balance training, stretching hamstrings IT band bilaterally, back exercises, home exercise program and modalities.  Will see her back in 5 to 6 weeks see how she is doing overall.  Questions were encouraged and answered at length  PAIN:  R hip Are you having pain? Yes: NPRS scale: 10/10 Pain location: groin Pain description: ache, sharp Aggravating factors: L side lie Relieving factors: position change  PAIN:  Low back Are you having pain? Yes: NPRS scale: 10/10 Pain location: low back Pain description: ache   Aggravating factors: prolonged sitting Relieving factors: position changes  PRECAUTIONS: None  WEIGHT BEARING RESTRICTIONS No  FALLS:  Has patient fallen in last 6 months? No  LIVING ENVIRONMENT: Lives with: alone Lives in: House/apartment Stairs:  yes Has following equipment at home: power chair  OCCUPATION: retired  PLOF:  requires power chair for mobility  PATIENT GOALS To get around with less pain   OBJECTIVE:   DIAGNOSTIC FINDINGS:  Radiographs: Lumbar spine 2 views: Loss of lordotic curvature.  Grade 1 spinal listhesis L4 on L5.  Diminished disc space at L4-5.  Lower lumbar facet changes.  No acute fractures.  No acute findings.  PATIENT SURVEYS:  FOTO 40(51 predicted)  SCREENING FOR RED FLAGS: Bowel or bladder incontinence: No  COGNITION:  Overall cognitive status: Within functional limits for tasks assessed     SENSATION: Not tested  MUSCLE LENGTH: Hamstrings: WFL in sitting  POSTURE: Unable to stand erect due to pain  PALPATION: deferred  LUMBAR ROM: UTA due to debility and pain  Active A/PROM  eval  Flexion   Extension   Right lateral flexion   Left lateral flexion   Right rotation   Left rotation    (Blank rows = not tested)  LOWER EXTREMITY ROM:     Active  Right eval Left eval  Hip flexion    Hip extension    Hip abduction    Hip adduction    Hip internal rotation    Hip external rotation    Knee flexion    Knee extension [facility]  Ankle dorsiflexion 10d 10d  Ankle plantarflexion    Ankle inversion    Ankle eversion     (Blank rows = not tested)  LOWER EXTREMITY MMT:    MMT Right eval Left eval  Hip flexion 3p! 3+  Hip extension 3p! 3+  Hip abduction    Hip adduction    Hip internal rotation    Hip external rotation    Knee flexion 3+ 3+  Knee extension 3+ 3+  Ankle dorsiflexion 3+ 3+  Ankle plantarflexion 4 4  Ankle inversion    Ankle eversion     (Blank rows = not tested)  LUMBAR SPECIAL TESTS:   UTA due to pain and debility  FUNCTIONAL TESTS:  30 seconds chair stand test 1 rep performed from [facility] with UE support  GAIT: Distance walked: 16f Assistive device utilized: Single point cane Level of assistance: SBA Comments: antalgic and unable to stand erect    TODAY'S TREATMENT  Eval   PATIENT EDUCATION:  Education details: Discussed eval findings, rehab rationale and POC and patient is in agreement  Person educated: Patient Education method: Explanation Education comprehension: verbalized understanding and needs further education   HOME EXERCISE PROGRAM: Deferred due to pain, will establish land based following initiation of aquatic therapy.  ASSESSMENT:  CLINICAL IMPRESSION: Patient is a 71y.o. female who was seen today for physical therapy evaluation and treatment for debilitating R hip and low back pain.  Scope of assessment limited due to pain in R hip as well as debility and body habitus.  Any volitional movement of RLE evokes pain in R hip.  Patient mobilizes in power chair and requires SBA to transfer with stand pivot technique needing UE support with poor safety awareness observed   OBJECTIVE IMPAIRMENTS Abnormal gait, decreased activity tolerance, decreased endurance, decreased knowledge of condition, decreased knowledge of use of DME, decreased mobility, difficulty walking, decreased ROM, decreased strength, decreased safety awareness, postural dysfunction, obesity, and pain.   ACTIVITY LIMITATIONS carrying, lifting, bending,  sitting, standing, squatting, sleeping, stairs, transfers, and bed mobility  PARTICIPATION LIMITATIONS: meal prep, cleaning, laundry, driving, and community activity  PERSONAL FACTORS Behavior pattern, Fitness, Past/current experiences, and Time since onset of injury/illness/exacerbation are also affecting patient's functional outcome.   REHAB POTENTIAL: Fair based on chronicity as well as intolerance to activity and inability to define  distinct aggravating/relieving factors  CLINICAL DECISION MAKING: Evolving/moderate complexity  EVALUATION COMPLEXITY: Moderate   GOALS: Goals reviewed with patient? No  SHORT TERM GOALS: Target date: 11/23/2021  Patient to demonstrate independence in HEP  Baseline:TBD Goal status: INITIAL  2.  Initiate aquatic PT Baseline: In progress Goal status: INITIAL  3.  43f ambulation with SPC and SBA Baseline: 5 ft with SPC and SBA Goal status: INITIAL  4.  Patient to transfer I and safely Baseline: SBA with poor safety awareness Goal status: INITIAL    LONG TERM GOALS: Target date: 12/21/2021  559fambulation with LRAD Baseline: 5 ft with SPC And SBA Goal status: INITIAL  2.  Decrease pain to 6/10 in R hip/back Baseline: 10/10  both regions Goal status: INITIAL  3.  Increase FOTO score to 50 Baseline: 40 at evaluation Goal status: INITIAL  4.  Increase 30s stand score to 2 reps Baseline: 1 [facility] Goal status: INITIAL     PLAN: PT FREQUENCY: 1x/week  PT DURATION: 8 weeks  PLANNED INTERVENTIONS: Therapeutic exercises, Therapeutic activity, Neuromuscular re-education, Balance training, Gait training, Patient/Family education, Self Care, Joint mobilization, DME instructions, Aquatic Therapy, Wheelchair mobility training, and Re-evaluation.  PLAN FOR NEXT SESSION: Initiate aquatic therapy to facilitate mobility outside of power [facility]   JeLanice ShirtsPT 10/26/2021, 1:04 PM

## 2021-10-26 ENCOUNTER — Ambulatory Visit: Payer: Medicare Other | Attending: Physician Assistant

## 2021-10-26 DIAGNOSIS — M25551 Pain in right hip: Secondary | ICD-10-CM | POA: Diagnosis present

## 2021-10-26 DIAGNOSIS — G8929 Other chronic pain: Secondary | ICD-10-CM | POA: Insufficient documentation

## 2021-10-26 DIAGNOSIS — R2689 Other abnormalities of gait and mobility: Secondary | ICD-10-CM | POA: Insufficient documentation

## 2021-10-26 DIAGNOSIS — M545 Low back pain, unspecified: Secondary | ICD-10-CM | POA: Diagnosis present

## 2021-10-26 DIAGNOSIS — M5416 Radiculopathy, lumbar region: Secondary | ICD-10-CM | POA: Diagnosis not present

## 2021-10-26 NOTE — Patient Instructions (Signed)
Aquatic Therapy at [facility]-  What to Expect!  Where:   [facility] [address] [place], [place] [address] Rehab phone [phone]  NOTE:  You will receive an automated phone message reminding you of your appt and it will say the appointment is at the [address] [facility] [address] [facility] clinic.          How to Prepare: Please make sure you drink 8 ounces of water about one hour prior to your pool session A caregiver may attend if needed with the patient to help assist as needed. A caregiver can sit in the pool room on chair. Please arrive IN YOUR SUIT and 15 minutes prior to your appointment - this helps to avoid delays in starting your session. Please make sure to attend to any toileting needs prior to entering the pool Locker rooms for changing are provided.   There is direct access to the pool deck form the locker room.  You can lock your belongings in a locker with lock provided. Once on the pool deck your therapist will ask if you have signed the Patient  Consent and Assignment of Benefits form before beginning treatment Your therapist may take your blood pressure prior to, during and after your session if indicated We usually try and create a home exercise program based on activities we do in the pool.  Please be thinking about who might be able to assist you in the pool should you need to participate in an aquatic home exercise program at the time of discharge if you need assistance.  Some patients do not want to or do not have the ability to participate in an aquatic home program - this is not a barrier in any way to you participating in aquatic therapy as part of your current therapy plan! After Discharge from PT, you can continue using home program at  the [facility]/, there is a drop-in fee for $5 ($45 a month)or for 60 years  or older $4.00 ($40 a month for seniors ) or any local [employer] pool.  Memberships for purchase are  available for gym/pool at [facility]  IT IS VERY IMPORTANT THAT YOUR LAST VISIT BE IN THE CLINIC AT [address] AFTER YOUR LAST AQUATIC VISIT.  PLEASE MAKE SURE THAT YOU HAVE A [address]  APPOINTMENT SCHEDULED.   About the pool: Pool is located approximately 500 FT from the entrance of the building.  Please bring a support person if you need assistance traveling this      distance.   Your therapist will assist you in entering the water; there are two ways to           enter: stairs with railings, and a mechanical lift. Your therapist will determine the most appropriate way for you.  Water temperature is usually between 88-90 degrees  There may be up to 2 other swimmers in the pool at the same time  The pool deck is tile, please wear shoes with good traction if you prefer not to be barefoot.    Contact Info:  For appointment scheduling and cancellations:         Please call the [facility]  PH:[phone]              Aquatic Therapy  Outpatient Rehabilitation @ [facility]       All sessions are 45 minutes                                                    

## 2021-11-01 ENCOUNTER — Telehealth: Payer: Self-pay | Admitting: Physician Assistant

## 2021-11-01 ENCOUNTER — Other Ambulatory Visit: Payer: Self-pay | Admitting: Orthopaedic Surgery

## 2021-11-01 MED ORDER — TIZANIDINE HCL 4 MG PO TABS: ORAL_TABLET | ORAL | 0 refills | Status: DC

## 2021-11-01 NOTE — Telephone Encounter (Signed)
Pt called requesting a refill of muscle relaxer meds. Please send to pharmacy on file. Pt phone number is [phone]

## 2021-11-03 ENCOUNTER — Ambulatory Visit: Payer: Medicare Other

## 2021-11-08 NOTE — Progress Notes (Addendum)
Vanessa Palmer, Vanessa Palmer ([id number]) Visit Report for 11/09/2021 Allergy List Details Patient Name: Date of Service: Vanessa Palmer, Vanessa Palmer 11/09/2021 2:45 PM Medical Record Number: [id number] Patient Account Number: [id number] Date of Birth/Sex: Treating RN: Oct 24, 1950 (71 y.o. Vanessa Palmer Primary Care Vanessa Palmer: Vanessa Palmer Other Clinician: Referring Vanessa Palmer: Treating Vanessa Palmer/Extender: Vanessa Palmer, Vanessa Palmer in Treatment: 0 Allergies Active Allergies ACE Inhibitors Reaction: angioedema regadenoson Reaction: hives Lexiscan Shellfish Containing Products Reaction: anaphylaxis peanut oil Reaction: itching, rash, welts hydrocodone Reaction: itching hydromorphone Reaction: rash oxycodone Reaction: itching- tolerates with benadryl propoxyphene Reaction: itching Vicodin Reaction: itching Allergy Notes Electronic Signature(s) Signed: 11/09/2021 4:34:43 PM By: Vanessa Iba Previous Signature: 11/08/2021 4:40:02 PM Version By: Vanessa Iba Previous Signature: 11/08/2021 8:11:44 AM Version By: Vanessa Iba Entered By: Vanessa Iba on 11/09/2021 15:02:34 -------------------------------------------------------------------------------- Arrival Information Details Patient Name: Date of Service: Vanessa Palmer, Vanessa Breath D. 11/09/2021 2:45 PM Medical Record Number: [id number] Patient Account Number: [id number] Date of Birth/Sex: Treating RN: September 01, 1950 (71 y.o. Vanessa Palmer Primary Care Vanessa Palmer: Vanessa Palmer Other Clinician: Referring Vanessa Palmer: Treating Vanessa Palmer/Extender: Vanessa Palmer, Vanessa Palmer in Treatment: 0 Visit Information Patient Arrived: Wheel Chair Arrival Time: 14:55 Accompanied By: daughter Transfer Assistance: Manual Patient Identification Verified: Yes Secondary Verification Process Completed: Yes Patient Requires Transmission-Based Precautions: No Patient Has Alerts: Yes Patient Alerts: Patient on Blood Thinner ABI's= R 1.05  L1.16 History Since Last Visit Added or deleted any medications: No Any new allergies or adverse reactions: No Had a fall or experienced change in activities of daily living that may affect risk of falls: No Signs or symptoms of abuse/neglect since last visito No Hospitalized since last visit: No Implantable device outside of the clinic excluding cellular tissue based products placed in the center since last visit: No Has Dressing in Place as Prescribed: Yes Electronic Signature(s) Signed: 11/09/2021 3:26:36 PM By: Vanessa Iba Entered By: Vanessa Iba on 11/09/2021 15:26:36 -------------------------------------------------------------------------------- Clinic Level of Care Assessment Details Patient Name: Date of Service: Vanessa Palmer 11/09/2021 2:45 PM Medical Record Number: [id number] Patient Account Number: [id number] Date of Birth/Sex: Treating RN: January 01, 1951 (71 y.o. Vanessa Palmer Primary Care Isabeau Mccalla: Vanessa Palmer Other Clinician: Referring Vanessa Palmer: Treating Soriya Worster/Extender: Vanessa Palmer, Vanessa Palmer in Treatment: 0 Clinic Level of Care Assessment Items TOOL 1 Quantity Score X- 1 0 Use when EandM and Procedure is performed on INITIAL visit ASSESSMENTS - Nursing Assessment / Reassessment X- 1 20 General Physical Exam (combine w/ comprehensive assessment (listed just below) when performed on new pt. evals) X- 1 25 Comprehensive Assessment (HX, ROS, Risk Assessments, Wounds Hx, etc.) ASSESSMENTS - Wound and Skin Assessment / Reassessment []  - 0 Dermatologic / Skin Assessment (not related to wound area) ASSESSMENTS - Ostomy and/or Continence Assessment and Care []  - 0 Incontinence Assessment and Management []  - 0 Ostomy Care Assessment and Management (repouching, etc.) PROCESS - Coordination of Care []  - 0 Simple Patient / Family Education for ongoing care X- 1 20 Complex (extensive) Patient / Family Education for ongoing care X- 1  10 Staff obtains , Records, T Results / Process Orders est []  - 0 Staff telephones HHA, Nursing Homes / Clarify orders / etc []  - 0 Routine Transfer to another Facility (non-emergent condition) []  - 0 Routine Hospital Admission (non-emergent condition) []  - 0 New Admissions / / Ordering NPWT Apligraf, etc. , []  - 0 Emergency Hospital Admission (emergent condition) PROCESS - Special Needs []  - 0 Pediatric / Minor Patient Management []  - 0 Isolation Patient  Management []  - 0 Hearing / Language / Visual special needs []  - 0 Assessment of Community assistance (transportation, D/C planning, etc.) []  - 0 Additional assistance / Altered mentation []  - 0 Support Surface(s) Assessment (bed, cushion, seat, etc.) INTERVENTIONS - Miscellaneous []  - 0 External ear exam []  - 0 Patient Transfer (multiple staff / Nurse, adultHoyer Lift / Similar devices) []  - 0 Simple Staple / Suture removal (25 or less) []  - 0 Complex Staple / Suture removal (26 or more) []  - 0 Hypo/Hyperglycemic Management (do not check if billed separately) []  - 0 Ankle / Brachial Index (ABI) - do not check if billed separately Has the patient been seen at the hospital within the last three years: Yes Total Score: 75 Level Of Care: New/Established - Level 2 Electronic Signature(s) Signed: 11/09/2021 4:34:43 PM By: Vanessa Palmer Entered By: Vanessa Palmer on 11/09/2021 15:45:46 -------------------------------------------------------------------------------- Compression Therapy Details Patient Name: Date of Service: Vanessa Palmer, Vanessa D. 11/09/2021 2:45 PM Medical Record Number: [id number] Patient Account Number: [id number] Date of Birth/Sex: Treating RN: 07-Feb-1951 [age] y.o. Vanessa CluckF) Barnhart, Palmer Primary Care Vanessa Palmer: Vanessa MassonMaldonado, Vanessa Palmer Other Clinician: Referring Aliannah Holstrom: Treating Vanessa Palmer/Extender: Vanessa Hoopsobson, Vanessa Palmer, Vanessa Palmer in Treatment: 0 Compression Therapy Performed for Wound  Assessment: Wound #3 Left,Anterior Lower Leg Performed By: Clinician Vanessa IbaBarnhart, Jodi, RN Compression Type: Three Layer Post Procedure Diagnosis Same as Pre-procedure Electronic Signature(s) Signed: 11/09/2021 4:34:43 PM By: Vanessa Palmer Entered By: Vanessa Palmer on 11/09/2021 15:42:21 -------------------------------------------------------------------------------- Compression Therapy Details Patient Name: Date of Service: Vanessa Palmer, Vanessa D. 11/09/2021 2:45 PM Medical Record Number: [id number] Patient Account Number: [id number] Date of Birth/Sex: Treating RN: 07-Feb-1951 [age] y.o. Vanessa CluckF) Barnhart, Palmer Primary Care Deeya Richeson: Vanessa MassonMaldonado, Vanessa Palmer Other Clinician: Referring Oiva Dibari: Treating Dovber Ernest/Extender: Vanessa Hoopsobson, Vanessa Palmer, Vanessa Palmer in Treatment: 0 Compression Therapy Performed for Wound Assessment: Wound #4 Right,Anterior Lower Leg Performed By: Clinician Vanessa IbaBarnhart, Jodi, RN Compression Type: Three Layer Post Procedure Diagnosis Same as Pre-procedure Electronic Signature(s) Signed: 11/09/2021 4:34:43 PM By: Vanessa Palmer Entered By: Vanessa Palmer on 11/09/2021 15:42:21 -------------------------------------------------------------------------------- Compression Therapy Details Patient Name: Date of Service: Vanessa Palmer, Vanessa D. 11/09/2021 2:45 PM Medical Record Number: [id number] Patient Account Number: [id number] Date of Birth/Sex: Treating RN: 07-Feb-1951 [age] y.o. Vanessa CluckF) Barnhart, Palmer Primary Care Sunjai Levandoski: Vanessa MassonMaldonado, Vanessa Palmer Other Clinician: Referring Ziad Maye: Treating Murad Staples/Extender: Vanessa Hoopsobson, Vanessa Palmer, Vanessa Palmer in Treatment: 0 Compression Therapy Performed for Wound Assessment: Wound #5 Right,Lateral Lower Leg Performed By: Clinician Vanessa IbaBarnhart, Jodi, RN Compression Type: Three Layer Post Procedure Diagnosis Same as Pre-procedure Electronic Signature(s) Signed: 11/09/2021 4:34:43 PM By: Vanessa Palmer Entered By: Vanessa Palmer on 11/09/2021  15:42:21 -------------------------------------------------------------------------------- Compression Therapy Details Patient Name: Date of Service: Vanessa Palmer, Vanessa D. 11/09/2021 2:45 PM Medical Record Number: [id number] Patient Account Number: [id number] Date of Birth/Sex: Treating RN: 07-Feb-1951 [age] y.o. Vanessa CluckF) Barnhart, Palmer Primary Care Kalyiah Saintil: Vanessa MassonMaldonado, Vanessa Palmer Other Clinician: Referring Nahuel Wilbert: Treating Collene Massimino/Extender: Vanessa Hoopsobson, Vanessa Palmer, Vanessa Palmer in Treatment: 0 Compression Therapy Performed for Wound Assessment: Wound #6 Right,Posterior Lower Leg Performed By: Clinician Vanessa IbaBarnhart, Jodi, RN Compression Type: Three Layer Post Procedure Diagnosis Same as Pre-procedure Electronic Signature(s) Signed: 11/09/2021 4:34:43 PM By: Vanessa Palmer Entered By: Vanessa Palmer on 11/09/2021 15:42:21 -------------------------------------------------------------------------------- Encounter Discharge Information Details Patient Name: Date of Service: Vanessa Palmer, Vanessa BreathIDELL D. 11/09/2021 2:45 PM Medical Record Number: [id number] Patient Account Number: [id number] Date of Birth/Sex: Treating RN: 07-Feb-1951 [age] y.o. Vanessa CluckF) Barnhart, Palmer Primary Care Blessing Zaucha: Other Clinician: Harriet MassonMaldonado, Vanessa Palmer Referring Letticia Bhattacharyya: Treating Rosha Cocker/Extender: Vanessa Hoopsobson, Vanessa Palmer, Vanessa Palmer in Treatment: 0 Encounter Discharge Information Items Discharge  Condition: Stable Ambulatory Status: Wheelchair Discharge Destination: Home Transportation: Private Auto Accompanied By: daughter Schedule Follow-up Appointment: Yes Clinical Summary of Care: Provided on 11/09/2021 Form Type Recipient Paper Patient Patient Electronic Signature(s) Signed: 11/09/2021 4:34:43 PM By: Vanessa Iba Entered By: Vanessa Iba on 11/09/2021 16:12:36 -------------------------------------------------------------------------------- Lower Extremity Assessment Details Patient Name: Date of Service: Vanessa Rad D.  11/09/2021 2:45 PM Medical Record Number: [id number] Patient Account Number: [id number] Date of Birth/Sex: Treating RN: May 24, 1950 (71 y.o. Vanessa Palmer Primary Care Roza Creamer: Vanessa Palmer Other Clinician: Referring Jacyln Carmer: Treating Akire Rennert/Extender: Vanessa Palmer, Vanessa Palmer in Treatment: 0 Edema Assessment Assessed: Kyra Searles: Yes] [Right: Yes] Edema: [Left: Yes] [Right: Yes] Calf Left: Right: Point of Measurement: 28 cm From Medial Instep 54.2 cm 57.8 cm Ankle Left: Right: Point of Measurement: 9 cm From Medial Instep 27 cm 28 cm Knee To Floor Left: Right: From Medial Instep 36 cm 36 cm Vascular Assessment Pulses: Dorsalis Pedis Palpable: [Left:Yes] [Right:Yes] Notes ABI's: R=1.05 L=1.16 Electronic Signature(s) Signed: 11/09/2021 4:34:43 PM By: Vanessa Iba Entered By: Vanessa Iba on 11/09/2021 15:10:56 -------------------------------------------------------------------------------- Multi Wound Chart Details Patient Name: Date of Service: Vanessa Rad D. 11/09/2021 2:45 PM Medical Record Number: [id number] Patient Account Number: [id number] Date of Birth/Sex: Treating RN: 03/31/1950 (71 y.o. F) Primary Care Kolbie Clarkston: Vanessa Palmer Other Clinician: Referring Macyn Shropshire: Treating Torrie Namba/Extender: Vanessa Palmer, Vanessa Palmer in Treatment: 0 Vital Signs Height(in): Pulse(bpm): 64 Weight(lbs): Blood Pressure(mmHg): 172/98 Body Mass Index(BMI): Temperature(F): 98.3 Respiratory Rate(breaths/min): 18 Photos: Left, Anterior Lower Leg Right, Anterior Lower Leg Right, Lateral Lower Leg Wound Location: Blister Blister Blister Wounding Event: Lymphedema Lymphedema Lymphedema Primary Etiology: Anemia, Asthma, Sleep Apnea, Anemia, Asthma, Sleep Apnea, Anemia, Asthma, Sleep Apnea, Comorbid History: Congestive Heart Failure, Coronary Congestive Heart Failure, Coronary Congestive Heart Failure, Coronary Artery Disease, Deep Vein Artery  Disease, Deep Vein Artery Disease, Deep Vein Thrombosis, Hypertension, Myocardial Thrombosis, Hypertension, Myocardial Thrombosis, Hypertension, Myocardial Infarction, Gout, Osteoarthritis, SeizureInfarction, Gout, Osteoarthritis, SeizureInfarction, Gout, Osteoarthritis, Seizure Disorder Disorder Disorder 10/23/2021 10/23/2021 10/23/2021 Date Acquired: 0 0 0 Palmer of Treatment: Open Open Open Wound Status: No No No Wound Recurrence: 0.7x0.5x0.1 0.5x1x0.1 0.7x0.7x0.1 Measurements L x W x D (cm) 0.275 0.393 0.385 A (cm) : rea 0.027 0.039 0.038 Volume (cm) : 0.00% 0.00% 0.00% % Reduction in Area: 0.00% 0.00% 0.00% % Reduction in Volume: Full Thickness Without Exposed Full Thickness Without Exposed Full Thickness Without Exposed Classification: Support Structures Support Structures Support Structures Medium Medium Medium Exudate Amount: Serous Serous Serous Exudate Type: Psychologist, forensic Exudate Color: Distinct, outline attached Distinct, outline attached Distinct, outline attached Wound Margin: Large (67-100%) Large (67-100%) Large (67-100%) Granulation Amount: Red Red Red Granulation Quality: Small (1-33%) Small (1-33%) Small (1-33%) Necrotic Amount: Fat Layer (Subcutaneous Tissue): Yes Fat Layer (Subcutaneous Tissue): Yes Fat Layer (Subcutaneous Tissue): Yes Exposed Structures: Fascia: No Fascia: No Fascia: No Tendon: No Tendon: No Tendon: No Muscle: No Muscle: No Muscle: No Joint: No Joint: No Joint: No Bone: No Bone: No Bone: No None None None Epithelialization: Compression Therapy Compression Therapy Compression Therapy Procedures Performed: Wound Number: 6 N/A N/A Photos: N/A N/A Right, Posterior Lower Leg N/A N/A Wound Location: Blister N/A N/A Wounding Event: Lymphedema N/A N/A Primary Etiology: Anemia, Asthma, Sleep Apnea, N/A N/A Comorbid History: Congestive Heart Failure, Coronary Artery Disease, Deep Vein Thrombosis, Hypertension,  Myocardial Infarction, Gout, Osteoarthritis, Seizure Disorder 10/23/2021 N/A N/A Date Acquired: 0 N/A N/A Palmer of Treatment: Open N/A N/A Wound Status: No N/A N/A Wound Recurrence: 0.5x0.4x0.1 N/A N/A Measurements  L x W x D (cm) 0.157 N/A N/A A (cm) : rea 0.016 N/A N/A Volume (cm) : 0.00% N/A N/A % Reduction in Area: 0.00% N/A N/A % Reduction in Volume: Full Thickness Without Exposed N/A N/A Classification: Support Structures Medium N/A N/A Exudate Amount: Serous N/A N/A Exudate Type: amber N/A N/A Exudate Color: Distinct, outline attached N/A N/A Wound Margin: Large (67-100%) N/A N/A Granulation Amount: Red N/A N/A Granulation Quality: Small (1-33%) N/A N/A Necrotic Amount: Fat Layer (Subcutaneous Tissue): Yes N/A N/A Exposed Structures: Fascia: No Tendon: No Muscle: No Joint: No Bone: No None N/A N/A Epithelialization: Compression Therapy N/A N/A Procedures Performed: Treatment Notes Electronic Signature(s) Signed: 11/09/2021 4:35:07 PM By: Baltazar Najjar MD Entered By: Baltazar Najjar on 11/09/2021 15:55:30 -------------------------------------------------------------------------------- Multi-Disciplinary Care Plan Details Patient Name: Date of Service: Vanessa Palmer, Vanessa Breath D. 11/09/2021 2:45 PM Medical Record Number: [id number] Patient Account Number: [id number] Date of Birth/Sex: Treating RN: 07-Mar-1950 (71 y.o. Vanessa Palmer Primary Care Emilina Smarr: Vanessa Palmer Other Clinician: Referring Janey Petron: Treating Selso Mannor/Extender: Vanessa Palmer, Vanessa Palmer in Treatment: 0 Active Inactive Venous Leg Ulcer Nursing Diagnoses: Actual venous Insuffiency (use after diagnosis is confirmed) Goals: Patient will maintain optimal edema control Date Initiated: 11/09/2021 Target Resolution Date: 12/07/2021 Goal Status: Active Interventions: Assess peripheral edema status every visit. Compression as ordered Provide education on venous  insufficiency Treatment Activities: Therapeutic compression applied : 11/09/2021 Notes: Wound/Skin Impairment Nursing Diagnoses: Impaired tissue integrity Goals: Patient/caregiver will verbalize understanding of skin care regimen Date Initiated: 11/09/2021 Target Resolution Date: 12/07/2021 Goal Status: Active Ulcer/skin breakdown will have a volume reduction of 30% by week 4 Date Initiated: 11/09/2021 Target Resolution Date: 12/06/2021 Goal Status: Active Interventions: Assess patient/caregiver ability to obtain necessary supplies Assess patient/caregiver ability to perform ulcer/skin care regimen upon admission and as needed Assess ulceration(s) every visit Provide education on ulcer and skin care Treatment Activities: Topical wound management initiated : 11/09/2021 Notes: Electronic Signature(s) Signed: 11/09/2021 4:34:43 PM By: Vanessa Iba Entered By: Vanessa Iba on 11/09/2021 15:23:52 -------------------------------------------------------------------------------- Patient/Caregiver Education Details Patient Name: Date of Service: Vanessa Rad D. 9/14/2023andnbsp2:45 PM Medical Record Number: [id number] Patient Account Number: [id number] Date of Birth/Gender: Treating RN: October 21, 1950 (71 y.o. Vanessa Palmer Primary Care Physician: Vanessa Palmer Other Clinician: Referring Physician: Treating Physician/Extender: Georgette Shell Palmer in Treatment: 0 Education Assessment Education Provided To: Patient Education Topics Provided Venous: Methods: Explain/Verbal, Printed Responses: State content correctly Wound/Skin Impairment: Methods: Explain/Verbal, Printed Responses: State content correctly Electronic Signature(s) Signed: 11/09/2021 4:34:43 PM By: Vanessa Iba Entered By: Vanessa Iba on 11/09/2021 15:25:44 -------------------------------------------------------------------------------- Wound Assessment Details Patient Name: Date of  Service: Vanessa Rad D. 11/09/2021 2:45 PM Medical Record Number: [id number] Patient Account Number: [id number] Date of Birth/Sex: Treating RN: 1950-07-22 (70 y.o. Vanessa Palmer Primary Care Baruch Lewers: Vanessa Palmer Other Clinician: Referring Trenese Haft: Treating Depaul Arizpe/Extender: Vanessa Palmer, Vanessa Palmer in Treatment: 0 Wound Status Wound Number: 3 Primary Lymphedema Etiology: Wound Location: Left, Anterior Lower Leg Wound Open Wounding Event: Blister Status: Date Acquired: 10/23/2021 Comorbid Anemia, Asthma, Sleep Apnea, Congestive Heart Failure, Coronary Palmer Of Treatment: 0 History: Artery Disease, Deep Vein Thrombosis, Hypertension, Myocardial Clustered Wound: No Infarction, Gout, Osteoarthritis, Seizure Disorder Photos Wound Measurements Length: (cm) 0.7 Width: (cm) 0.5 Depth: (cm) 0.1 Area: (cm) 0.275 Volume: (cm) 0.027 % Reduction in Area: 0% % Reduction in Volume: 0% Epithelialization: None Tunneling: No Undermining: No Wound Description Classification: Full Thickness Without Exposed Support Structures Wound Margin: Distinct, outline attached Exudate Amount: Medium Exudate Type:  Serous Exudate Color: amber Foul Odor After Cleansing: No Slough/Fibrino Yes Wound Bed Granulation Amount: Large (67-100%) Exposed Structure Granulation Quality: Red Fascia Exposed: No Necrotic Amount: Small (1-33%) Fat Layer (Subcutaneous Tissue) Exposed: Yes Necrotic Quality: Adherent Slough Tendon Exposed: No Muscle Exposed: No Joint Exposed: No Bone Exposed: No Treatment Notes Wound #3 (Lower Leg) Wound Laterality: Left, Anterior Cleanser Soap and Water Discharge Instruction: May shower and wash wound with dial antibacterial soap and water prior to dressing change. Wound Cleanser Discharge Instruction: Cleanse the wound with wound cleanser prior to applying a clean dressing using gauze sponges, not tissue or cotton balls. Peri-Wound  Care Triamcinolone 15 (g) Discharge Instruction: Use triamcinolone 15 (g) as directed Sween Lotion (Moisturizing lotion) Discharge Instruction: Apply moisturizing lotion as directed Topical Primary Dressing KerraCel Ag Gelling Fiber Dressing, 4x5 in (silver alginate) Discharge Instruction: Apply silver alginate to wound bed as instructed Secondary Dressing ABD Pad, 8x10 Discharge Instruction: Apply over primary dressing as directed. Secured With Compression Wrap ThreePress (3 layer compression wrap) Discharge Instruction: Apply three layer compression as directed. Compression Stockings Add-Ons Electronic Signature(s) Signed: 11/09/2021 4:34:43 PM By: Vanessa Iba Entered By: Vanessa Iba on 11/09/2021 15:19:40 -------------------------------------------------------------------------------- Wound Assessment Details Patient Name: Date of Service: Vanessa Rad D. 11/09/2021 2:45 PM Medical Record Number: [id number] Patient Account Number: [id number] Date of Birth/Sex: Treating RN: 15-Jan-1951 (71 y.o. Vanessa Palmer Primary Care Pritika Alvarez: Vanessa Palmer Other Clinician: Referring Rasheen Schewe: Treating Linzee Depaul/Extender: Vanessa Palmer, Vanessa Palmer in Treatment: 0 Wound Status Wound Number: 4 Primary Lymphedema Etiology: Wound Location: Right, Anterior Lower Leg Wound Open Wounding Event: Blister Status: Date Acquired: 10/23/2021 Comorbid Anemia, Asthma, Sleep Apnea, Congestive Heart Failure, Coronary Palmer Of Treatment: 0 History: Artery Disease, Deep Vein Thrombosis, Hypertension, Myocardial Clustered Wound: No Infarction, Gout, Osteoarthritis, Seizure Disorder Photos Wound Measurements Length: (cm) 0.5 Width: (cm) 1 Depth: (cm) 0.1 Area: (cm) 0.393 Volume: (cm) 0.039 % Reduction in Area: 0% % Reduction in Volume: 0% Epithelialization: None Tunneling: No Undermining: No Wound Description Classification: Full Thickness Without Exposed Support  Structures Wound Margin: Distinct, outline attached Exudate Amount: Medium Exudate Type: Serous Exudate Color: amber Wound Bed Granulation Amount: Large (67-100%) Granulation Quality: Red Necrotic Amount: Small (1-33%) Necrotic Quality: Adherent Slough Foul Odor After Cleansing: No Slough/Fibrino Yes Exposed Structure Fascia Exposed: No Fat Layer (Subcutaneous Tissue) Exposed: Yes Tendon Exposed: No Muscle Exposed: No Joint Exposed: No Bone Exposed: No Treatment Notes Wound #4 (Lower Leg) Wound Laterality: Right, Anterior Cleanser Soap and Water Discharge Instruction: May shower and wash wound with dial antibacterial soap and water prior to dressing change. Wound Cleanser Discharge Instruction: Cleanse the wound with wound cleanser prior to applying a clean dressing using gauze sponges, not tissue or cotton balls. Peri-Wound Care Triamcinolone 15 (g) Discharge Instruction: Use triamcinolone 15 (g) as directed Sween Lotion (Moisturizing lotion) Discharge Instruction: Apply moisturizing lotion as directed Topical Primary Dressing KerraCel Ag Gelling Fiber Dressing, 4x5 in (silver alginate) Discharge Instruction: Apply silver alginate to wound bed as instructed Secondary Dressing ABD Pad, 8x10 Discharge Instruction: Apply over primary dressing as directed. Secured With Compression Wrap ThreePress (3 layer compression wrap) Discharge Instruction: Apply three layer compression as directed. Compression Stockings Add-Ons Electronic Signature(s) Signed: 11/09/2021 4:34:43 PM By: Vanessa Iba Entered By: Vanessa Iba on 11/09/2021 15:20:06 -------------------------------------------------------------------------------- Wound Assessment Details Patient Name: Date of Service: Vanessa Rad D. 11/09/2021 2:45 PM Medical Record Number: [id number] Patient Account Number: [id number] Date of Birth/Sex: Treating RN: Sep 09, 1950 (71 y.o. Vanessa Palmer Primary Care  Samin Milke: Vanessa Palmer Other Clinician: Referring Lateshia Schmoker: Treating Mattelyn Imhoff/Extender: Vanessa Palmer, Vanessa Palmer in Treatment: 0 Wound Status Wound Number: 5 Primary Lymphedema Etiology: Wound Location: Right, Lateral Lower Leg Wound Open Wounding Event: Blister Status: Date Acquired: 10/23/2021 Comorbid Anemia, Asthma, Sleep Apnea, Congestive Heart Failure, Coronary Palmer Of Treatment: 0 History: Artery Disease, Deep Vein Thrombosis, Hypertension, Myocardial Clustered Wound: No Infarction, Gout, Osteoarthritis, Seizure Disorder Photos Wound Measurements Length: (cm) 0.7 Width: (cm) 0.7 Depth: (cm) 0.1 Area: (cm) 0.385 Volume: (cm) 0.038 % Reduction in Area: 0% % Reduction in Volume: 0% Epithelialization: None Tunneling: No Undermining: No Wound Description Classification: Full Thickness Without Exposed Support Structures Wound Margin: Distinct, outline attached Exudate Amount: Medium Exudate Type: Serous Exudate Color: amber Foul Odor After Cleansing: No Slough/Fibrino Yes Wound Bed Granulation Amount: Large (67-100%) Exposed Structure Granulation Quality: Red Fascia Exposed: No Necrotic Amount: Small (1-33%) Fat Layer (Subcutaneous Tissue) Exposed: Yes Necrotic Quality: Adherent Slough Tendon Exposed: No Muscle Exposed: No Joint Exposed: No Bone Exposed: No Treatment Notes Wound #5 (Lower Leg) Wound Laterality: Right, Lateral Cleanser Soap and Water Discharge Instruction: May shower and wash wound with dial antibacterial soap and water prior to dressing change. Wound Cleanser Discharge Instruction: Cleanse the wound with wound cleanser prior to applying a clean dressing using gauze sponges, not tissue or cotton balls. Peri-Wound Care Triamcinolone 15 (g) Discharge Instruction: Use triamcinolone 15 (g) as directed Sween Lotion (Moisturizing lotion) Discharge Instruction: Apply moisturizing lotion as directed Topical Primary  Dressing KerraCel Ag Gelling Fiber Dressing, 4x5 in (silver alginate) Discharge Instruction: Apply silver alginate to wound bed as instructed Secondary Dressing ABD Pad, 8x10 Discharge Instruction: Apply over primary dressing as directed. Secured With Compression Wrap ThreePress (3 layer compression wrap) Discharge Instruction: Apply three layer compression as directed. Compression Stockings Add-Ons Electronic Signature(s) Signed: 11/09/2021 4:34:43 PM By: Vanessa Iba Entered By: Vanessa Iba on 11/09/2021 15:20:28 -------------------------------------------------------------------------------- Wound Assessment Details Patient Name: Date of Service: Vanessa Rad D. 11/09/2021 2:45 PM Medical Record Number: [id number] Patient Account Number: [id number] Date of Birth/Sex: Treating RN: Jul 25, 1950 (71 y.o. Vanessa Palmer Primary Care Fayth Trefry: Vanessa Palmer Other Clinician: Referring Ramsay Bognar: Treating Jaston Havens/Extender: Vanessa Palmer, Vanessa Palmer in Treatment: 0 Wound Status Wound Number: 6 Primary Lymphedema Etiology: Wound Location: Right, Posterior Lower Leg Wound Open Wounding Event: Blister Status: Date Acquired: 10/23/2021 Comorbid Anemia, Asthma, Sleep Apnea, Congestive Heart Failure, Coronary Palmer Of Treatment: 0 History: Artery Disease, Deep Vein Thrombosis, Hypertension, Myocardial Clustered Wound: No Infarction, Gout, Osteoarthritis, Seizure Disorder Photos Wound Measurements Length: (cm) 0.5 Width: (cm) 0.4 Depth: (cm) 0.1 Area: (cm) 0.157 Volume: (cm) 0.016 % Reduction in Area: 0% % Reduction in Volume: 0% Epithelialization: None Tunneling: No Undermining: No Wound Description Classification: Full Thickness Without Exposed Support Structures Wound Margin: Distinct, outline attached Exudate Amount: Medium Exudate Type: Serous Exudate Color: amber Foul Odor After Cleansing: No Slough/Fibrino Yes Wound Bed Granulation Amount:  Large (67-100%) Exposed Structure Granulation Quality: Red Fascia Exposed: No Necrotic Amount: Small (1-33%) Fat Layer (Subcutaneous Tissue) Exposed: Yes Necrotic Quality: Adherent Slough Tendon Exposed: No Muscle Exposed: No Joint Exposed: No Bone Exposed: No Treatment Notes Wound #6 (Lower Leg) Wound Laterality: Right, Posterior Cleanser Soap and Water Discharge Instruction: May shower and wash wound with dial antibacterial soap and water prior to dressing change. Wound Cleanser Discharge Instruction: Cleanse the wound with wound cleanser prior to applying a clean dressing using gauze sponges, not tissue or cotton balls. Peri-Wound Care Triamcinolone 15 (g) Discharge Instruction: Use triamcinolone 15 (  g) as directed Sween Lotion (Moisturizing lotion) Discharge Instruction: Apply moisturizing lotion as directed Topical Primary Dressing KerraCel Ag Gelling Fiber Dressing, 4x5 in (silver alginate) Discharge Instruction: Apply silver alginate to wound bed as instructed Secondary Dressing ABD Pad, 8x10 Discharge Instruction: Apply over primary dressing as directed. Secured With Compression Wrap ThreePress (3 layer compression wrap) Discharge Instruction: Apply three layer compression as directed. Compression Stockings Add-Ons Electronic Signature(s) Signed: 11/09/2021 4:34:43 PM By: Vanessa Iba Entered By: Vanessa Iba on 11/09/2021 15:21:07 -------------------------------------------------------------------------------- Vitals Details Patient Name: Date of Service: Vanessa Palmer, Vanessa D. 11/09/2021 2:45 PM Medical Record Number: [id number] Patient Account Number: [id number] Date of Birth/Sex: Treating RN: Jul 18, 1950 (71 y.o. Vanessa Palmer Primary Care Dalisha Shively: Vanessa Palmer Other Clinician: Referring Yvonda Fouty: Treating Shahana Capes/Extender: Vanessa Palmer, Vanessa Palmer in Treatment: 0 Vital Signs Time Taken: 14:59 Temperature (F): 98.3 Pulse (bpm):  64 Respiratory Rate (breaths/min): 18 Blood Pressure (mmHg): 172/98 Reference Range: 80 - 120 mg / dl Electronic Signature(s) Signed: 11/09/2021 4:34:43 PM By: Vanessa Iba Entered By: Vanessa Iba on 11/09/2021 15:00:15

## 2021-11-09 ENCOUNTER — Encounter (HOSPITAL_BASED_OUTPATIENT_CLINIC_OR_DEPARTMENT_OTHER): Payer: Medicare Other | Attending: Internal Medicine | Admitting: Internal Medicine

## 2021-11-09 DIAGNOSIS — L97818 Non-pressure chronic ulcer of other part of right lower leg with other specified severity: Secondary | ICD-10-CM | POA: Diagnosis not present

## 2021-11-09 DIAGNOSIS — M5416 Radiculopathy, lumbar region: Secondary | ICD-10-CM | POA: Insufficient documentation

## 2021-11-09 DIAGNOSIS — I890 Lymphedema, not elsewhere classified: Secondary | ICD-10-CM | POA: Diagnosis not present

## 2021-11-09 DIAGNOSIS — I130 Hypertensive heart and chronic kidney disease with heart failure and stage 1 through stage 4 chronic kidney disease, or unspecified chronic kidney disease: Secondary | ICD-10-CM | POA: Diagnosis not present

## 2021-11-09 DIAGNOSIS — N183 Chronic kidney disease, stage 3 unspecified: Secondary | ICD-10-CM | POA: Insufficient documentation

## 2021-11-09 DIAGNOSIS — L97828 Non-pressure chronic ulcer of other part of left lower leg with other specified severity: Secondary | ICD-10-CM | POA: Diagnosis not present

## 2021-11-09 DIAGNOSIS — I87303 Chronic venous hypertension (idiopathic) without complications of bilateral lower extremity: Secondary | ICD-10-CM | POA: Insufficient documentation

## 2021-11-09 NOTE — Progress Notes (Signed)
Vanessa Palmer ([id number]) Visit Report for 11/09/2021 Abuse Risk Screen Details Patient Name: Date of Service: Vanessa Palmer, Vanessa Palmer 11/09/2021 2:45 PM Medical Record Number: [id number] Patient Account Number: [id number] Date of Birth/Sex: Treating RN: [birth year] (71 y.o. Roel Cluck Primary Care Evanell Redlich: Harriet Masson Other Clinician: Referring Johara Lodwick: Treating Marvalene Barrett/Extender: Cathie Hoops, Cammie Weeks in Treatment: 0 Abuse Risk Screen Items Answer ABUSE RISK SCREEN: Has anyone close to you tried to hurt or harm you recentlyo No Do you feel uncomfortable with anyone in your familyo No Has anyone forced you do things that you didnt want to doo No Electronic Signature(s) Signed: 11/09/2021 4:34:43 PM By: Antonieta Iba Entered By: Antonieta Iba on 11/09/2021 15:02:51 -------------------------------------------------------------------------------- Activities of Daily Living Details Patient Name: Date of Service: Vanessa Palmer 11/09/2021 2:45 PM Medical Record Number: [id number] Patient Account Number: [id number] Date of Birth/Sex: Treating RN: 1950-09-25 (71 y.o. Roel Cluck Primary Care Arianis Bowditch: Harriet Masson Other Clinician: Referring Jahad Old: Treating Araceli Coufal/Extender: Cathie Hoops, Cammie Weeks in Treatment: 0 Activities of Daily Living Items Answer Activities of Daily Living (Please select one for each item) Drive Automobile Not Able T Medications ake Need Assistance Use T elephone Completely Able Care for Appearance Need Assistance Use T oilet Need Assistance Bath / Shower Completely Able Dress Self Need Assistance Feed Self Completely Able Walk Need Assistance Get In / Out Bed Need Assistance Housework Need Assistance Prepare Meals Need Assistance Handle Money Completely Able Shop for Self Need Assistance Electronic Signature(s) Signed: 11/09/2021 4:34:43 PM By: Antonieta Iba Entered By: Antonieta Iba on  11/09/2021 15:03:51 -------------------------------------------------------------------------------- Education Screening Details Patient Name: Date of Service: Vanessa Rad D. 11/09/2021 2:45 PM Medical Record Number: [id number] Patient Account Number: [id number] Date of Birth/Sex: Treating RN: Apr 11, 1950 (70 y.o. Roel Cluck Primary Care Azarah Dacy: Harriet Masson Other Clinician: Referring Nixie Laube: Treating Kenzey Birkland/Extender: Cathie Hoops, Cammie Weeks in Treatment: 0 Primary Learner Assessed: Patient Learning Preferences/Education Level/Primary Language Learning Preference: Explanation, Demonstration, Printed Material Highest Education Level: High School Preferred Language: English Cognitive Barrier Language Barrier: No Translator Needed: No Memory Deficit: No Emotional Barrier: No Cultural/Religious Beliefs Affecting Medical Care: No Physical Barrier Impaired Vision: Yes Glasses Impaired Hearing: No Decreased Hand dexterity: No Knowledge/Comprehension Knowledge Level: High Comprehension Level: High Ability to understand written instructions: High Ability to understand verbal instructions: High Motivation Anxiety Level: Calm Cooperation: Cooperative Education Importance: Acknowledges Need Interest in Health Problems: Asks Questions Perception: Coherent Willingness to Engage in Self-Management High Activities: Readiness to Engage in Self-Management High Activities: Electronic Signature(s) Signed: 11/09/2021 4:34:43 PM By: Antonieta Iba Entered By: Antonieta Iba on 11/09/2021 15:04:21 -------------------------------------------------------------------------------- Fall Risk Assessment Details Patient Name: Date of Service: Vanessa Palmer, Vanessa Breath D. 11/09/2021 2:45 PM Medical Record Number: [id number] Patient Account Number: [id number] Date of Birth/Sex: Treating RN: 10-13-1950 (71 y.o. Roel Cluck Primary Care Shawntel Farnworth: Harriet Masson Other  Clinician: Referring Sharonne Ricketts: Treating Jannah Guardiola/Extender: Cathie Hoops, Cammie Weeks in Treatment: 0 Fall Risk Assessment Items Have you had 2 or more falls in the last 12 monthso 0 No Have you had any fall that resulted in injury in the last 12 monthso 0 No FALLS RISK SCREEN History of falling - immediate or within 3 months 0 No Secondary diagnosis (Do you have 2 or more medical diagnoseso) 15 Yes Ambulatory aid None/bed rest/wheelchair/nurse 0 No Crutches/cane/walker 15 Yes Furniture 0 No Intravenous therapy Access/Saline/Heparin Lock 0 No Gait/Transferring Normal/ bed rest/ wheelchair 0 No Weak (short steps with or without shuffle, stooped but  able to lift head while walking, may seek 10 Yes support from furniture) Impaired (short steps with shuffle, may have difficulty arising from chair, head down, impaired 0 No balance) Mental Status Oriented to own ability 0 Yes Electronic Signature(s) Signed: 11/09/2021 4:34:43 PM By: Antonieta Iba Entered By: Antonieta Iba on 11/09/2021 15:04:55 -------------------------------------------------------------------------------- Foot Assessment Details Patient Name: Date of Service: Vanessa Rad D. 11/09/2021 2:45 PM Medical Record Number: [id number] Patient Account Number: [id number] Date of Birth/Sex: Treating RN: 08/02/1950 (71 y.o. Roel Cluck Primary Care Nigil Braman: Harriet Masson Other Clinician: Referring Amry Cathy: Treating English Tomer/Extender: Cathie Hoops, Cammie Weeks in Treatment: 0 Foot Assessment Items Site Locations + = Sensation present, - = Sensation absent, C = Callus, U = Ulcer R = Redness, W = Warmth, M = Maceration, PU = Pre-ulcerative lesion F = Fissure, S = Swelling, D = Dryness Assessment Right: Left: Other Deformity: No No Prior Foot Ulcer: No No Prior Amputation: No No Charcot Joint: No No Ambulatory Status: Ambulatory With Help Assistance Device: Wheelchair Gait:  Steady Electronic Signature(s) Signed: 11/09/2021 4:34:43 PM By: Antonieta Iba Entered By: Antonieta Iba on 11/09/2021 15:05:39 -------------------------------------------------------------------------------- Nutrition Risk Screening Details Patient Name: Date of Service: Palmer, Vanessa 11/09/2021 2:45 PM Medical Record Number: [id number] Patient Account Number: [id number] Date of Birth/Sex: Treating RN: 1950-07-20 (71 y.o. Roel Cluck Primary Care Lashanta Elbe: Harriet Masson Other Clinician: Referring Newton Frutiger: Treating Ameris Akamine/Extender: Cathie Hoops, Cammie Weeks in Treatment: 0 Height (in): Weight (lbs): Body Mass Index (BMI): Nutrition Risk Screening Items Score Screening NUTRITION RISK SCREEN: I have an illness or condition that made me change the kind and/or amount of food I eat 0 No I eat fewer than two meals per day 0 No I eat few fruits and vegetables, or milk products 0 No I have three or more drinks of beer, liquor or wine almost every day 0 No I have tooth or mouth problems that make it hard for me to eat 0 No I don't always have enough money to buy the food I need 0 No I eat alone most of the time 0 No I take three or more different prescribed or over-the-counter drugs a day 1 Yes Without wanting to, I have lost or gained 10 pounds in the last six months 0 No I am not always physically able to shop, cook and/or feed myself 0 No Nutrition Protocols Good Risk Protocol 0 No interventions needed Moderate Risk Protocol High Risk Proctocol Risk Level: Good Risk Score: 1 Electronic Signature(s) Signed: 11/09/2021 4:34:43 PM By: Antonieta Iba Entered By: Antonieta Iba on 11/09/2021 15:05:04

## 2021-11-09 NOTE — Progress Notes (Signed)
Vanessa Palmer ([id number]) Visit Report for 11/09/2021 Chief Complaint Document Details Patient Name: Date of Service: Vanessa Palmer, Vanessa Palmer 11/09/2021 2:45 PM Medical Record Number: [id number] Patient Account Number: [id number] Date of Birth/Sex: Treating RN: 12/10/1950 (71 y.o. F) Primary Care Provider: Maudry Mayhew Other Clinician: Referring Provider: Treating Provider/Extender: Erasmo Leventhal, Cammie Weeks in Treatment: 0 Information Obtained from: Patient Chief Complaint 09/07/2021; scattered open wounds to the lower extremities bilaterally 11/09/21; Patient returns to clinic with recurrent small weeping areas on her bilateral lower legs Electronic Signature(s) Signed: 11/09/2021 4:35:07 PM By: Linton Ham MD Entered By: Linton Ham on 11/09/2021 15:56:31 -------------------------------------------------------------------------------- HPI Details Patient Name: Date of Service: Vanessa Palmer, Vanessa Chick D. 11/09/2021 2:45 PM Medical Record Number: [id number] Patient Account Number: [id number] Date of Birth/Sex: Treating RN: 20-May-1950 (71 y.o. F) Primary Care Provider: Maudry Mayhew Other Clinician: Referring Provider: Treating Provider/Extender: Erasmo Leventhal, Cammie Weeks in Treatment: 0 History of Present Illness HPI Description: Admission 09/07/2021 Vanessa Palmer is a 71 year old female with a past medical history of essential hypertension, chronic kidney disease stage III, and lymphedema that presents to the clinic for a 2-week history of scattered open wounds to her lower extremities bilaterally. She states this has been an ongoing issue for the past year. She states that the wounds wax and wane in healing. She currently takes torsemide. She does not wear compression stockings. She currently denies signs of infection. 7/20; patient presents for follow-up. She received 1 juxta lite compression wrap in the mail. She was supposed to receive 2. She states she  talked to the company and they are sending her her second pair. We used antibiotic ointment and silver alginate under 3 layer compression at last clinic visit. The silver alginate stuck to the wound bed. The left lower extremity wounds have healed. 7/27; patient presents for follow-up. She still has not received her juxta lite compression for the right leg. We spoke with the wound care supply company today and they stated it will arrive today. We have been using Xeroform under 3 layer compression to the right lower extremity. She has been using the juxta lite compression to the left lower extremity. Her wounds remain healed to the left lower extremity. READMISSION 11/09/2021 This is a patient that we had in clinic in July. She has lymphedema secondary to chronic venous insufficiency and had scattered open areas on her bilateral lower extremities she received 3 layer compression bilaterally the area is closed over and she was prescribed juxta lite stockings. She tells me she was compliant with the juxta lites, apparently she has home health aides to help her put these on. About 3 weeks ago she noted increase in swelling and weeping areas in her bilateral lower legs and she is in for review of this. She does not have arterial issues her ABIs in the clinic last time were quite normal bilaterally. She does have chronic kidney disease stage III and lumbar radiculopathy. Electronic Signature(s) Signed: 11/09/2021 4:35:07 PM By: Linton Ham MD Signed: 11/09/2021 4:35:07 PM By: Linton Ham MD Entered By: Linton Ham on 11/09/2021 15:58:31 -------------------------------------------------------------------------------- Physical Exam Details Patient Name: Date of Service: Vanessa Small D. 11/09/2021 2:45 PM Medical Record Number: [id number] Patient Account Number: [id number] Date of Birth/Sex: Treating RN: [birth year] (71 y.o. F) Primary Care Provider: Maudry Mayhew Other  Clinician: Referring Provider: Treating Provider/Extender: Erasmo Leventhal, Cammie Weeks in Treatment: 0 Constitutional Patient is hypertensive.. Pulse regular and within target range for patient.Marland Kitchen Respirations regular, non-labored and within  target range.. Temperature is normal and within the target range for the patient.Marland Kitchen Appears in no distress. Respiratory work of breathing is normal. Cardiovascular Massive stage III lymphedema bilaterally nonpitting extending well up into her thighs. Notes Wound exam; multiple small pinpoint areas leaking edema fluid. The edema in her legs is tight but no evidence of cellulitis or DVT clinically. Electronic Signature(s) Signed: 11/09/2021 4:35:07 PM By: Baltazar Najjar MD Entered By: Baltazar Najjar on 11/09/2021 15:59:34 -------------------------------------------------------------------------------- Physician Orders Details Patient Name: Date of Service: Vanessa Palmer, Vanessa Breath D. 11/09/2021 2:45 PM Medical Record Number: [id number] Patient Account Number: [id number] Date of Birth/Sex: Treating RN: 18-Mar-1950 (71 y.o. Roel Cluck Primary Care Provider: Harriet Masson Other Clinician: Referring Provider: Treating Provider/Extender: Cathie Hoops, Cammie Weeks in Treatment: 0 Verbal / Phone Orders: No Diagnosis Coding Follow-up Appointments ppointment in 1 week. - with Dr. Mikey Bussing (any room available) Return A Anesthetic (In clinic) Topical Lidocaine 5% applied to wound bed (In clinic) Topical Lidocaine 4% applied to wound bed Bathing/ Shower/ Hygiene May shower with protection but do not get wound dressing(s) wet. - Can use cast protector bag from [facility] or [employer] Edema Control - Lymphedema / SCD / Other Elevate legs to the level of the heart or above for 30 minutes daily and/or when sitting, a frequency of: - throughout the day Avoid standing for long periods of time. Wound Treatment Wound #3 - Lower Leg Wound  Laterality: Left, Anterior Cleanser: Soap and Water 1 x Per Week/30 Days Discharge Instructions: May shower and wash wound with dial antibacterial soap and water prior to dressing change. Cleanser: Wound Cleanser 1 x Per Week/30 Days Discharge Instructions: Cleanse the wound with wound cleanser prior to applying a clean dressing using gauze sponges, not tissue or cotton balls. Peri-Wound Care: Triamcinolone 15 (g) 1 x Per Week/30 Days Discharge Instructions: Use triamcinolone 15 (g) as directed Peri-Wound Care: Sween Lotion (Moisturizing lotion) 1 x Per Week/30 Days Discharge Instructions: Apply moisturizing lotion as directed Prim Dressing: KerraCel Ag Gelling Fiber Dressing, 4x5 in (silver alginate) 1 x Per Week/30 Days ary Discharge Instructions: Apply silver alginate to wound bed as instructed Secondary Dressing: ABD Pad, 8x10 1 x Per Week/30 Days Discharge Instructions: Apply over primary dressing as directed. Compression Wrap: ThreePress (3 layer compression wrap) 1 x Per Week/30 Days Discharge Instructions: Apply three layer compression as directed. Wound #4 - Lower Leg Wound Laterality: Right, Anterior Cleanser: Soap and Water 1 x Per Week/30 Days Discharge Instructions: May shower and wash wound with dial antibacterial soap and water prior to dressing change. Cleanser: Wound Cleanser 1 x Per Week/30 Days Discharge Instructions: Cleanse the wound with wound cleanser prior to applying a clean dressing using gauze sponges, not tissue or cotton balls. Peri-Wound Care: Triamcinolone 15 (g) 1 x Per Week/30 Days Discharge Instructions: Use triamcinolone 15 (g) as directed Peri-Wound Care: Sween Lotion (Moisturizing lotion) 1 x Per Week/30 Days Discharge Instructions: Apply moisturizing lotion as directed Prim Dressing: KerraCel Ag Gelling Fiber Dressing, 4x5 in (silver alginate) 1 x Per Week/30 Days ary Discharge Instructions: Apply silver alginate to wound bed as instructed Secondary  Dressing: ABD Pad, 8x10 1 x Per Week/30 Days Discharge Instructions: Apply over primary dressing as directed. Compression Wrap: ThreePress (3 layer compression wrap) 1 x Per Week/30 Days Discharge Instructions: Apply three layer compression as directed. Wound #5 - Lower Leg Wound Laterality: Right, Lateral Cleanser: Soap and Water 1 x Per Week/30 Days Discharge Instructions: May shower and wash wound with dial  antibacterial soap and water prior to dressing change. Cleanser: Wound Cleanser 1 x Per Week/30 Days Discharge Instructions: Cleanse the wound with wound cleanser prior to applying a clean dressing using gauze sponges, not tissue or cotton balls. Peri-Wound Care: Triamcinolone 15 (g) 1 x Per Week/30 Days Discharge Instructions: Use triamcinolone 15 (g) as directed Peri-Wound Care: Sween Lotion (Moisturizing lotion) 1 x Per Week/30 Days Discharge Instructions: Apply moisturizing lotion as directed Prim Dressing: KerraCel Ag Gelling Fiber Dressing, 4x5 in (silver alginate) 1 x Per Week/30 Days ary Discharge Instructions: Apply silver alginate to wound bed as instructed Secondary Dressing: ABD Pad, 8x10 1 x Per Week/30 Days Discharge Instructions: Apply over primary dressing as directed. Compression Wrap: ThreePress (3 layer compression wrap) 1 x Per Week/30 Days Discharge Instructions: Apply three layer compression as directed. Wound #6 - Lower Leg Wound Laterality: Right, Posterior Cleanser: Soap and Water 1 x Per Week/30 Days Discharge Instructions: May shower and wash wound with dial antibacterial soap and water prior to dressing change. Cleanser: Wound Cleanser 1 x Per Week/30 Days Discharge Instructions: Cleanse the wound with wound cleanser prior to applying a clean dressing using gauze sponges, not tissue or cotton balls. Peri-Wound Care: Triamcinolone 15 (g) 1 x Per Week/30 Days Discharge Instructions: Use triamcinolone 15 (g) as directed Peri-Wound Care: Sween Lotion  (Moisturizing lotion) 1 x Per Week/30 Days Discharge Instructions: Apply moisturizing lotion as directed Prim Dressing: KerraCel Ag Gelling Fiber Dressing, 4x5 in (silver alginate) ary 1 x Per Week/30 Days Discharge Instructions: Apply silver alginate to wound bed as instructed Secondary Dressing: ABD Pad, 8x10 1 x Per Week/30 Days Discharge Instructions: Apply over primary dressing as directed. Compression Wrap: ThreePress (3 layer compression wrap) 1 x Per Week/30 Days Discharge Instructions: Apply three layer compression as directed. Electronic Signature(s) Signed: 11/09/2021 4:34:43 PM By: Antonieta Iba Signed: 11/09/2021 4:35:07 PM By: Baltazar Najjar MD Entered By: Antonieta Iba on 11/09/2021 15:44:49 -------------------------------------------------------------------------------- Problem List Details Patient Name: Date of Service: Vanessa Palmer, Vanessa Breath D. 11/09/2021 2:45 PM Medical Record Number: [id number] Patient Account Number: [id number] Date of Birth/Sex: Treating RN: 1950/10/27 (71 y.o. F) Primary Care Provider: Harriet Masson Other Clinician: Referring Provider: Treating Provider/Extender: Cathie Hoops, Cammie Weeks in Treatment: 0 Active Problems ICD-10 Encounter Code Description Active Date MDM Diagnosis I89.0 Lymphedema, not elsewhere classified 11/09/2021 No Yes L97.818 Non-pressure chronic ulcer of other part of right lower leg with other specified 11/09/2021 No Yes severity L97.828 Non-pressure chronic ulcer of other part of left lower leg with other specified 11/09/2021 No Yes severity I87.303 Chronic venous hypertension (idiopathic) without complications of bilateral 11/09/2021 No Yes lower extremity Inactive Problems Resolved Problems Electronic Signature(s) Signed: 11/09/2021 4:35:07 PM By: Baltazar Najjar MD Entered By: Baltazar Najjar on 11/09/2021 15:54:59 -------------------------------------------------------------------------------- Progress  Note Details Patient Name: Date of Service: Vanessa Palmer D. 11/09/2021 2:45 PM Medical Record Number: [id number] Patient Account Number: [id number] Date of Birth/Sex: Treating RN: 1950-04-04 (71 y.o. F) Primary Care Provider: Harriet Masson Other Clinician: Referring Provider: Treating Provider/Extender: Cathie Hoops, Cammie Weeks in Treatment: 0 Subjective Chief Complaint Information obtained from Patient 09/07/2021; scattered open wounds to the lower extremities bilaterally 11/09/21; Patient returns to clinic with recurrent small weeping areas on her bilateral lower legs History of Present Illness (HPI) Admission 09/07/2021 [name] is a 71 year old female with a past medical history of essential hypertension, chronic kidney disease stage III, and lymphedema that presents to the clinic for a 2-week history of scattered open wounds to her lower  extremities bilaterally. She states this has been an ongoing issue for the past year. She states that the wounds wax and wane in healing. She currently takes torsemide. She does not wear compression stockings. She currently denies signs of infection. 7/20; patient presents for follow-up. She received 1 juxta lite compression wrap in the mail. She was supposed to receive 2. She states she talked to the company and they are sending her her second pair. We used antibiotic ointment and silver alginate under 3 layer compression at last clinic visit. The silver alginate stuck to the wound bed. The left lower extremity wounds have healed. 7/27; patient presents for follow-up. She still has not received her juxta lite compression for the right leg. We spoke with the wound care supply company today and they stated it will arrive today. We have been using Xeroform under 3 layer compression to the right lower extremity. She has been using the juxta lite compression to the left lower extremity. Her wounds remain healed to the left lower  extremity. READMISSION 11/09/2021 This is a patient that we had in clinic in July. She has lymphedema secondary to chronic venous insufficiency and had scattered open areas on her bilateral lower extremities she received 3 layer compression bilaterally the area is closed over and she was prescribed juxta lite stockings. She tells me she was compliant with the juxta lites, apparently she has home health aides to help her put these on. About 3 weeks ago she noted increase in swelling and weeping areas in her bilateral lower legs and she is in for review of this. She does not have arterial issues her ABIs in the clinic last time were quite normal bilaterally. She does have chronic kidney disease stage III and lumbar radiculopathy. Patient History Information obtained from Chart. Allergies ACE Inhibitors (Reaction: angioedema), regadenoson (Reaction: hives), Lexiscan, Shellfish Containing Products (Reaction: anaphylaxis), peanut oil (Reaction: itching, rash, welts), hydrocodone (Reaction: itching), hydromorphone (Reaction: rash), oxycodone (Reaction: itching- tolerates with benadryl), propoxyphene (Reaction: itching), Vicodin (Reaction: itching) Family History Diabetes - Siblings, Heart Disease - Father,Siblings. Social History Never smoker, Alcohol Use - Rarely - sober since 2006. Medical History Hematologic/Lymphatic Patient has history of Anemia Respiratory Patient has history of Asthma, Sleep Apnea - 2018- oCPAP Cardiovascular Patient has history of Congestive Heart Failure, Coronary Artery Disease, Deep Vein Thrombosis - 2014 both legs, Hypertension, Myocardial Infarction - 2005 Musculoskeletal Patient has history of Gout, Osteoarthritis Neurologic Patient has history of Seizure Disorder - as a teenager Hospitalization/Surgery History - heart cath 2018, 2003, 2004, 2006. - right total hip replacement 2016. - hysterectomy. Medical A Surgical History Notes nd Constitutional Symptoms  (General Health) chronic back pain Migraine last 2018 Cardiovascular hyperlipidemia Gastrointestinal GERD Genitourinary stage III CKD Musculoskeletal DJD Psychiatric depression Review of Systems (ROS) Eyes Complains or has symptoms of Glasses / Contacts. Ear/Nose/Mouth/Throat Denies complaints or symptoms of Chronic sinus problems or rhinitis. Integumentary (Skin) Complains or has symptoms of Wounds. Objective Constitutional Patient is hypertensive.. Pulse regular and within target range for patient.Marland Kitchen Respirations regular, non-labored and within target range.. Temperature is normal and within the target range for the patient.Marland Kitchen Appears in no distress. Vitals Time Taken: 2:59 PM, Temperature: 98.3 F, Pulse: 64 bpm, Respiratory Rate: 18 breaths/min, Blood Pressure: 172/98 mmHg. Respiratory work of breathing is normal. Cardiovascular Massive stage III lymphedema bilaterally nonpitting extending well up into her thighs. General Notes: Wound exam; multiple small pinpoint areas leaking edema fluid. The edema in her legs is tight but no evidence of cellulitis  or DVT clinically. Integumentary (Hair, Skin) Wound #3 status is Open. Original cause of wound was Blister. The date acquired was: 10/23/2021. The wound is located on the Left,Anterior Lower Leg. The wound measures 0.7cm length x 0.5cm width x 0.1cm depth; 0.275cm^2 area and 0.027cm^3 volume. There is Fat Layer (Subcutaneous Tissue) exposed. There is no tunneling or undermining noted. There is a medium amount of serous drainage noted. The wound margin is distinct with the outline attached to the wound base. There is large (67-100%) red granulation within the wound bed. There is a small (1-33%) amount of necrotic tissue within the wound bed including Adherent Slough. Wound #4 status is Open. Original cause of wound was Blister. The date acquired was: 10/23/2021. The wound is located on the Right,Anterior Lower Leg. The wound measures  0.5cm length x 1cm width x 0.1cm depth; 0.393cm^2 area and 0.039cm^3 volume. There is Fat Layer (Subcutaneous Tissue) exposed. There is no tunneling or undermining noted. There is a medium amount of serous drainage noted. The wound margin is distinct with the outline attached to the wound base. There is large (67-100%) red granulation within the wound bed. There is a small (1-33%) amount of necrotic tissue within the wound bed including Adherent Slough. Wound #5 status is Open. Original cause of wound was Blister. The date acquired was: 10/23/2021. The wound is located on the Right,Lateral Lower Leg. The wound measures 0.7cm length x 0.7cm width x 0.1cm depth; 0.385cm^2 area and 0.038cm^3 volume. There is Fat Layer (Subcutaneous Tissue) exposed. There is no tunneling or undermining noted. There is a medium amount of serous drainage noted. The wound margin is distinct with the outline attached to the wound base. There is large (67-100%) red granulation within the wound bed. There is a small (1-33%) amount of necrotic tissue within the wound bed including Adherent Slough. Wound #6 status is Open. Original cause of wound was Blister. The date acquired was: 10/23/2021. The wound is located on the Right,Posterior Lower Leg. The wound measures 0.5cm length x 0.4cm width x 0.1cm depth; 0.157cm^2 area and 0.016cm^3 volume. There is Fat Layer (Subcutaneous Tissue) exposed. There is no tunneling or undermining noted. There is a medium amount of serous drainage noted. The wound margin is distinct with the outline attached to the wound base. There is large (67-100%) red granulation within the wound bed. There is a small (1-33%) amount of necrotic tissue within the wound bed including Adherent Slough. Assessment Active Problems ICD-10 Lymphedema, not elsewhere classified Non-pressure chronic ulcer of other part of right lower leg with other specified severity Non-pressure chronic ulcer of other part of left  lower leg with other specified severity Chronic venous hypertension (idiopathic) without complications of bilateral lower extremity Procedures Wound #3 Pre-procedure diagnosis of Wound #3 is a Lymphedema located on the Left,Anterior Lower Leg . There was a Three Layer Compression Therapy Procedure by Lorrin Jackson, RN. Post procedure Diagnosis Wound #3: Same as Pre-Procedure Wound #4 Pre-procedure diagnosis of Wound #4 is a Lymphedema located on the Right,Anterior Lower Leg . There was a Three Layer Compression Therapy Procedure by Lorrin Jackson, RN. Post procedure Diagnosis Wound #4: Same as Pre-Procedure Wound #5 Pre-procedure diagnosis of Wound #5 is a Lymphedema located on the Right,Lateral Lower Leg . There was a Three Layer Compression Therapy Procedure by Lorrin Jackson, RN. Post procedure Diagnosis Wound #5: Same as Pre-Procedure Wound #6 Pre-procedure diagnosis of Wound #6 is a Lymphedema located on the Right,Posterior Lower Leg . There was a Three Layer  Compression Therapy Procedure by Lorrin Jackson, RN. Post procedure Diagnosis Wound #6: Same as Pre-Procedure Plan Follow-up Appointments: Return Appointment in 1 week. - with Dr. Heber [place] (any room available) Anesthetic: (In clinic) Topical Lidocaine 5% applied to wound bed (In clinic) Topical Lidocaine 4% applied to wound bed Bathing/ Shower/ Hygiene: May shower with protection but do not get wound dressing(s) wet. - Can use cast protector bag from [facility], [facility] or [employer] Edema Control - Lymphedema / SCD / Other: Elevate legs to the level of the heart or above for 30 minutes daily and/or when sitting, a frequency of: - throughout the day Avoid standing for long periods of time. WOUND #3: - Lower Leg Wound Laterality: Left, Anterior Cleanser: Soap and Water 1 x Per Week/30 Days Discharge Instructions: May shower and wash wound with dial antibacterial soap and water prior to dressing change. Cleanser: Wound Cleanser 1 x  Per Week/30 Days Discharge Instructions: Cleanse the wound with wound cleanser prior to applying a clean dressing using gauze sponges, not tissue or cotton balls. Peri-Wound Care: Triamcinolone 15 (g) 1 x Per Week/30 Days Discharge Instructions: Use triamcinolone 15 (g) as directed Peri-Wound Care: Sween Lotion (Moisturizing lotion) 1 x Per Week/30 Days Discharge Instructions: Apply moisturizing lotion as directed Prim Dressing: KerraCel Ag Gelling Fiber Dressing, 4x5 in (silver alginate) 1 x Per Week/30 Days ary Discharge Instructions: Apply silver alginate to wound bed as instructed Secondary Dressing: ABD Pad, 8x10 1 x Per Week/30 Days Discharge Instructions: Apply over primary dressing as directed. Com pression Wrap: ThreePress (3 layer compression wrap) 1 x Per Week/30 Days Discharge Instructions: Apply three layer compression as directed. WOUND #4: - Lower Leg Wound Laterality: Right, Anterior Cleanser: Soap and Water 1 x Per Week/30 Days Discharge Instructions: May shower and wash wound with dial antibacterial soap and water prior to dressing change. Cleanser: Wound Cleanser 1 x Per Week/30 Days Discharge Instructions: Cleanse the wound with wound cleanser prior to applying a clean dressing using gauze sponges, not tissue or cotton balls. Peri-Wound Care: Triamcinolone 15 (g) 1 x Per Week/30 Days Discharge Instructions: Use triamcinolone 15 (g) as directed Peri-Wound Care: Sween Lotion (Moisturizing lotion) 1 x Per Week/30 Days Discharge Instructions: Apply moisturizing lotion as directed Prim Dressing: KerraCel Ag Gelling Fiber Dressing, 4x5 in (silver alginate) 1 x Per Week/30 Days ary Discharge Instructions: Apply silver alginate to wound bed as instructed Secondary Dressing: ABD Pad, 8x10 1 x Per Week/30 Days Discharge Instructions: Apply over primary dressing as directed. Com pression Wrap: ThreePress (3 layer compression wrap) 1 x Per Week/30 Days Discharge Instructions:  Apply three layer compression as directed. WOUND #5: - Lower Leg Wound Laterality: Right, Lateral Cleanser: Soap and Water 1 x Per Week/30 Days Discharge Instructions: May shower and wash wound with dial antibacterial soap and water prior to dressing change. Cleanser: Wound Cleanser 1 x Per Week/30 Days Discharge Instructions: Cleanse the wound with wound cleanser prior to applying a clean dressing using gauze sponges, not tissue or cotton balls. Peri-Wound Care: Triamcinolone 15 (g) 1 x Per Week/30 Days Discharge Instructions: Use triamcinolone 15 (g) as directed Peri-Wound Care: Sween Lotion (Moisturizing lotion) 1 x Per Week/30 Days Discharge Instructions: Apply moisturizing lotion as directed Prim Dressing: KerraCel Ag Gelling Fiber Dressing, 4x5 in (silver alginate) 1 x Per Week/30 Days ary Discharge Instructions: Apply silver alginate to wound bed as instructed Secondary Dressing: ABD Pad, 8x10 1 x Per Week/30 Days Discharge Instructions: Apply over primary dressing as directed. Com pression Wrap: ThreePress (3  layer compression wrap) 1 x Per Week/30 Days Discharge Instructions: Apply three layer compression as directed. WOUND #6: - Lower Leg Wound Laterality: Right, Posterior Cleanser: Soap and Water 1 x Per Week/30 Days Discharge Instructions: May shower and wash wound with dial antibacterial soap and water prior to dressing change. Cleanser: Wound Cleanser 1 x Per Week/30 Days Discharge Instructions: Cleanse the wound with wound cleanser prior to applying a clean dressing using gauze sponges, not tissue or cotton balls. Peri-Wound Care: Triamcinolone 15 (g) 1 x Per Week/30 Days Discharge Instructions: Use triamcinolone 15 (g) as directed Peri-Wound Care: Sween Lotion (Moisturizing lotion) 1 x Per Week/30 Days Discharge Instructions: Apply moisturizing lotion as directed Prim Dressing: KerraCel Ag Gelling Fiber Dressing, 4x5 in (silver alginate) 1 x Per Week/30 Days ary Discharge  Instructions: Apply silver alginate to wound bed as instructed Secondary Dressing: ABD Pad, 8x10 1 x Per Week/30 Days Discharge Instructions: Apply over primary dressing as directed. Com pression Wrap: ThreePress (3 layer compression wrap) 1 x Per Week/30 Days Discharge Instructions: Apply three layer compression as directed. 1. We put silver alginate on the weeping areas that we could identify, ABDs under 3 layer compression bilaterally. 2. Apparently she was compliant with her juxta lites although she did not come in with them on today. #3 I have myself wondering about compression pumps although she will need to have failed 1 month prerequisite of standard compression Electronic Signature(s) Signed: 11/09/2021 4:35:07 PM By: Baltazar Najjar MD Entered By: Baltazar Najjar on 11/09/2021 16:02:02 -------------------------------------------------------------------------------- HxROS Details Patient Name: Date of Service: Vanessa Palmer, Vanessa Breath D. 11/09/2021 2:45 PM Medical Record Number: [id number] Patient Account Number: [id number] Date of Birth/Sex: Treating RN: [birth year] (71 y.o. Roel Cluck Primary Care Provider: Harriet Masson Other Clinician: Referring Provider: Treating Provider/Extender: Cathie Hoops, Cammie Weeks in Treatment: 0 Information Obtained From Chart Eyes Complaints and Symptoms: Positive for: Glasses / Contacts Ear/Nose/Mouth/Throat Complaints and Symptoms: Negative for: Chronic sinus problems or rhinitis Integumentary (Skin) Complaints and Symptoms: Positive for: Wounds Constitutional Symptoms (General Health) Medical History: Past Medical History Notes: chronic back pain Migraine last 2018 Hematologic/Lymphatic Medical History: Positive for: Anemia Respiratory Medical History: Positive for: Asthma; Sleep Apnea - 2018- oCPAP Cardiovascular Medical History: Positive for: Congestive Heart Failure; Coronary Artery Disease; Deep Vein Thrombosis  - 2014 both legs; Hypertension; Myocardial Infarction - 2005 Past Medical History Notes: hyperlipidemia Gastrointestinal Medical History: Past Medical History Notes: GERD Genitourinary Medical History: Past Medical History Notes: stage III CKD Musculoskeletal Medical History: Positive for: Gout; Osteoarthritis Past Medical History Notes: DJD Neurologic Medical History: Positive for: Seizure Disorder - as a teenager Psychiatric Medical History: Past Medical History Notes: depression Immunizations Pneumococcal Vaccine: Received Pneumococcal Vaccination: No Implantable Devices No devices added Hospitalization / Surgery History Type of Hospitalization/Surgery heart cath 2018, 2003, 2004, 2006 right total hip replacement 2016 hysterectomy Family and Social History Diabetes: Yes - Siblings; Heart Disease: Yes - Father,Siblings; Never smoker; Alcohol Use: Rarely - sober since 2006 Electronic Signature(s) Signed: 11/09/2021 4:34:43 PM By: Antonieta Iba Signed: 11/09/2021 4:35:07 PM By: Baltazar Najjar MD Entered By: Antonieta Iba on 11/09/2021 15:02:44 -------------------------------------------------------------------------------- SuperBill Details Patient Name: Date of Service: Vanessa Palmer, Ahtziri D. 11/09/2021 Medical Record Number: [id number] Patient Account Number: [id number] Date of Birth/Sex: Treating RN: Jul 05, 1950 (71 y.o. Roel Cluck Primary Care Provider: Harriet Masson Other Clinician: Referring Provider: Treating Provider/Extender: Cathie Hoops, Cammie Weeks in Treatment: 0 Diagnosis Coding ICD-10 Codes Code Description I89.0 Lymphedema, not elsewhere classified L97.818 Non-pressure chronic ulcer of  other part of right lower leg with other specified severity L97.828 Non-pressure chronic ulcer of other part of left lower leg with other specified severity I87.303 Chronic venous hypertension (idiopathic) without complications of bilateral lower  extremity Facility Procedures CPT4: Code [id number] Description: 2 - WOUND CARE VISIT-LEV 2 EST PT Modifier: 25 Quantity: 1 CPT4: [id number] foot Description: 1 BILATERAL: Application of multi-layer venous compression system; leg (below knee), including ankle and . Modifier: Quantity: 1 Physician Procedures : CPT4 Code Description Modifier [id number] - WC PHYS LEVEL 4 - EST PT 25 ICD-10 Diagnosis Description I89.0 Lymphedema, not elsewhere classified L97.828 Non-pressure chronic ulcer of other part of left lower leg with other specified severity L97.818  Non-pressure chronic ulcer of other part of right lower leg with other specified severity I87.303 Chronic venous hypertension (idiopathic) without complications of bilateral lower extremity Quantity: 1 Electronic Signature(s) Signed: 11/09/2021 4:35:07 PM By: Linton Ham MD Entered By: Linton Ham on 11/09/2021 16:02:34

## 2021-11-10 ENCOUNTER — Ambulatory Visit: Payer: Medicare Other

## 2021-11-16 ENCOUNTER — Encounter (HOSPITAL_BASED_OUTPATIENT_CLINIC_OR_DEPARTMENT_OTHER): Payer: Medicare Other | Admitting: Internal Medicine

## 2021-11-16 DIAGNOSIS — I890 Lymphedema, not elsewhere classified: Secondary | ICD-10-CM

## 2021-11-16 DIAGNOSIS — I87303 Chronic venous hypertension (idiopathic) without complications of bilateral lower extremity: Secondary | ICD-10-CM

## 2021-11-16 DIAGNOSIS — L97828 Non-pressure chronic ulcer of other part of left lower leg with other specified severity: Secondary | ICD-10-CM | POA: Diagnosis not present

## 2021-11-16 DIAGNOSIS — L97818 Non-pressure chronic ulcer of other part of right lower leg with other specified severity: Secondary | ICD-10-CM

## 2021-11-16 NOTE — Progress Notes (Signed)
Vanessa Palmer, Vanessa Palmer ([id number]) Visit Report for 11/16/2021 Chief Complaint Document Details Patient Name: Date of Service: Vanessa Palmer, Vanessa Palmer 11/16/2021 9:15 A M Medical Record Number: [id number] Patient Account Number: [id number] Date of Birth/Sex: Treating Vanessa Palmer: 01/17/1951 (71 y.o. F) Primary Care Provider: Maudry Mayhew Other Clinician: Referring Provider: Treating Provider/Extender: Theressa Millard in Treatment: 1 Information Obtained from: Patient Chief Complaint 09/07/2021; scattered open wounds to the lower extremities bilaterally 11/09/21; Patient returns to clinic with recurrent Palmer weeping areas on her bilateral lower legs Electronic Signature(s) Signed: 11/16/2021 10:53:41 AM By: Kalman Shan DO Entered By: Kalman Shan on 11/16/2021 09:55:39 -------------------------------------------------------------------------------- HPI Details Patient Name: Date of Service: Vanessa Palmer, Vanessa Chick D. 11/16/2021 9:15 A M Medical Record Number: [id number] Patient Account Number: [id number] Date of Birth/Sex: Treating Vanessa Palmer: 07-Jul-1950 (71 y.o. F) Primary Care Provider: Maudry Mayhew Other Clinician: Referring Provider: Treating Provider/Extender: Theressa Millard in Treatment: 1 History of Present Illness HPI Description: Admission 09/07/2021 Vanessa Palmer is a 71 year old female with a past medical history of essential hypertension, chronic kidney disease stage III, and lymphedema that presents to the clinic for a 2-week history of scattered open wounds to her lower extremities bilaterally. She states this has been an ongoing issue for the past year. She states that the wounds wax and wane in healing. She currently takes torsemide. She does not wear compression stockings. She currently denies signs of infection. 7/20; patient presents for follow-up. She received 1 juxta lite compression wrap in the mail. She was supposed to receive 2.  She states she talked to the company and they are sending her her second pair. We used antibiotic ointment and silver alginate under 3 layer compression at last clinic visit. The silver alginate stuck to the wound bed. The left lower extremity wounds have healed. 7/27; patient presents for follow-up. She still has not received her juxta lite compression for the right leg. We spoke with the wound care supply company today and they stated it will arrive today. We have been using Xeroform under 3 layer compression to the right lower extremity. She has been using the juxta lite compression to the left lower extremity. Her wounds remain healed to the left lower extremity. READMISSION 11/09/2021 This is a patient that we had in clinic in July. She has lymphedema secondary to chronic venous insufficiency and had scattered open areas on her bilateral lower extremities she received 3 layer compression bilaterally the area is closed over and she was prescribed juxta lite stockings. She tells me she was compliant with the juxta lites, apparently she has home health aides to help her put these on. About 3 weeks ago she noted increase in swelling and weeping areas in her bilateral lower legs and she is in for review of this. She does not have arterial issues her ABIs in the clinic last time were quite normal bilaterally. She does have chronic kidney disease stage III and lumbar radiculopathy. 9/21; patient presents for follow-up. She presented last week with open wounds to her legs bilaterally. Silver alginate under 3 layer compression was used. Her left lower extremity wounds have healed. She has a juxta light compression at home. She still has open wounds on the right lower extremity. She denies signs of infection. She tolerated the compression wraps well. Electronic Signature(s) Signed: 11/16/2021 10:53:41 AM By: Kalman Shan DO Entered By: Kalman Shan on 11/16/2021  09:56:22 -------------------------------------------------------------------------------- Physical Exam Details Patient Name: Date of Service: Vanessa Palmer, Vanessa D. 11/16/2021 9:15  A M Medical Record Number: [id number] Patient Account Number: [id number] Date of Birth/Sex: Treating Vanessa Palmer: [birth year] (71 y.o. F) Primary Care Provider: Maudry Mayhew Other Clinician: Referring Provider: Treating Provider/Extender: Theressa Millard in Treatment: 1 Constitutional respirations regular, non-labored and within target range for patient.. Cardiovascular 2+ dorsalis pedis/posterior tibialis pulses. Psychiatric pleasant and cooperative. Notes Left lower extremity: Epithelization to the previous wound site on the anterior distal aspect Right lower extremity: T the posterior and Lateral aspect there are open wounds with granulation tissue present. No signs of surrounding infection to any of the o wound beds. Good edema control. Electronic Signature(s) Signed: 11/16/2021 10:53:41 AM By: Kalman Shan DO Entered By: Kalman Shan on 11/16/2021 09:59:52 -------------------------------------------------------------------------------- Physician Orders Details Patient Name: Date of Service: Vanessa Palmer D. 11/16/2021 9:15 A M Medical Record Number: [id number] Patient Account Number: [id number] Date of Birth/Sex: Treating Vanessa Palmer: 1950-03-26 (71 y.o. Vanessa Palmer, Vanessa Palmer Primary Care Provider: Maudry Mayhew Other Clinician: Referring Provider: Treating Provider/Extender: Theressa Millard in Treatment: 1 Verbal / Phone Orders: No Diagnosis Coding ICD-10 Coding Code Description I89.0 Lymphedema, not elsewhere classified L97.818 Non-pressure chronic ulcer of other part of right lower leg with other specified severity L97.828 Non-pressure chronic ulcer of other part of left lower leg with other specified severity I87.303 Chronic venous hypertension  (idiopathic) without complications of bilateral lower extremity Follow-up Appointments ppointment in 1 week. - with Dr. Heber [place] and Sanjuan Dame # 7 Return A Anesthetic (In clinic) Topical Lidocaine 5% applied to wound bed (In clinic) Topical Lidocaine 4% applied to wound bed Bathing/ Shower/ Hygiene May shower with protection but do not get wound dressing(s) wet. - Can use cast protector bag from [facility], [facility] or [employer] Edema Control - Lymphedema / SCD / Other Elevate legs to the level of the heart or above for 30 minutes daily and/or when sitting, a frequency of: - throughout the day Avoid standing for long periods of time. Wound Treatment Wound #5 - Lower Leg Wound Laterality: Right, Lateral Cleanser: Soap and Water 1 x Per Week/30 Days Discharge Instructions: May shower and wash wound with dial antibacterial soap and water prior to dressing change. Cleanser: Wound Cleanser 1 x Per Week/30 Days Discharge Instructions: Cleanse the wound with wound cleanser prior to applying a clean dressing using gauze sponges, not tissue or cotton balls. Peri-Wound Care: Triamcinolone 15 (g) 1 x Per Week/30 Days Discharge Instructions: Use triamcinolone 15 (g) as directed Peri-Wound Care: Sween Lotion (Moisturizing lotion) 1 x Per Week/30 Days Discharge Instructions: Apply moisturizing lotion as directed Prim Dressing: KerraCel Ag Gelling Fiber Dressing, 4x5 in (silver alginate) 1 x Per Week/30 Days ary Discharge Instructions: Apply silver alginate to wound bed as instructed Secondary Dressing: ABD Pad, 8x10 1 x Per Week/30 Days Discharge Instructions: Apply over primary dressing as directed. Compression Wrap: ThreePress (3 layer compression wrap) 1 x Per Week/30 Days Discharge Instructions: Apply three layer compression as directed. Wound #6 - Lower Leg Wound Laterality: Right, Posterior Cleanser: Soap and Water 1 x Per Week/30 Days Discharge Instructions: May shower and wash wound with dial  antibacterial soap and water prior to dressing change. Cleanser: Wound Cleanser 1 x Per Week/30 Days Discharge Instructions: Cleanse the wound with wound cleanser prior to applying a clean dressing using gauze sponges, not tissue or cotton balls. Peri-Wound Care: Triamcinolone 15 (g) 1 x Per Week/30 Days Discharge Instructions: Use triamcinolone 15 (g) as directed Peri-Wound Care: Sween Lotion (Moisturizing lotion) 1 x Per Week/30 Days Discharge Instructions:  Apply moisturizing lotion as directed Prim Dressing: KerraCel Ag Gelling Fiber Dressing, 4x5 in (silver alginate) 1 x Per Week/30 Days ary Discharge Instructions: Apply silver alginate to wound bed as instructed Secondary Dressing: ABD Pad, 8x10 1 x Per Week/30 Days Discharge Instructions: Apply over primary dressing as directed. Compression Wrap: ThreePress (3 layer compression wrap) 1 x Per Week/30 Days Discharge Instructions: Apply three layer compression as directed. Electronic Signature(s) Signed: 11/16/2021 10:53:41 AM By: Kalman Shan DO Entered By: Kalman Shan on 11/16/2021 09:59:59 -------------------------------------------------------------------------------- Problem List Details Patient Name: Date of Service: Vanessa Palmer D. 11/16/2021 9:15 A M Medical Record Number: [id number] Patient Account Number: [id number] Date of Birth/Sex: Treating Vanessa Palmer: 14-Jan-1951 (71 y.o. F) Primary Care Provider: Maudry Mayhew Other Clinician: Referring Provider: Treating Provider/Extender: Theressa Millard in Treatment: 1 Active Problems ICD-10 Encounter Code Description Active Date MDM Diagnosis I89.0 Lymphedema, not elsewhere classified 11/09/2021 No Yes L97.818 Non-pressure chronic ulcer of other part of right lower leg with other specified 11/09/2021 No Yes severity L97.828 Non-pressure chronic ulcer of other part of left lower leg with other specified 11/09/2021 No Yes severity I87.303 Chronic  venous hypertension (idiopathic) without complications of bilateral 11/09/2021 No Yes lower extremity Inactive Problems Resolved Problems Electronic Signature(s) Signed: 11/16/2021 10:53:41 AM By: Kalman Shan DO Entered By: Kalman Shan on 11/16/2021 09:55:03 -------------------------------------------------------------------------------- Progress Note Details Patient Name: Date of Service: Vanessa Palmer D. 11/16/2021 9:15 A M Medical Record Number: [id number] Patient Account Number: [id number] Date of Birth/Sex: Treating Vanessa Palmer: Nov 29, 1950 (71 y.o. F) Primary Care Provider: Maudry Mayhew Other Clinician: Referring Provider: Treating Provider/Extender: Theressa Millard in Treatment: 1 Subjective Chief Complaint Information obtained from Patient 09/07/2021; scattered open wounds to the lower extremities bilaterally 11/09/21; Patient returns to clinic with recurrent Palmer weeping areas on her bilateral lower legs History of Present Illness (HPI) Admission 09/07/2021 [name] is a 71 year old female with a past medical history of essential hypertension, chronic kidney disease stage III, and lymphedema that presents to the clinic for a 2-week history of scattered open wounds to her lower extremities bilaterally. She states this has been an ongoing issue for the past year. She states that the wounds wax and wane in healing. She currently takes torsemide. She does not wear compression stockings. She currently denies signs of infection. 7/20; patient presents for follow-up. She received 1 juxta lite compression wrap in the mail. She was supposed to receive 2. She states she talked to the company and they are sending her her second pair. We used antibiotic ointment and silver alginate under 3 layer compression at last clinic visit. The silver alginate stuck to the wound bed. The left lower extremity wounds have healed. 7/27; patient presents for follow-up.  She still has not received her juxta lite compression for the right leg. We spoke with the wound care supply company today and they stated it will arrive today. We have been using Xeroform under 3 layer compression to the right lower extremity. She has been using the juxta lite compression to the left lower extremity. Her wounds remain healed to the left lower extremity. READMISSION 11/09/2021 This is a patient that we had in clinic in July. She has lymphedema secondary to chronic venous insufficiency and had scattered open areas on her bilateral lower extremities she received 3 layer compression bilaterally the area is closed over and she was prescribed juxta lite stockings. She tells me she was compliant with the juxta lites, apparently she has home health  aides to help her put these on. About 3 weeks ago she noted increase in swelling and weeping areas in her bilateral lower legs and she is in for review of this. She does not have arterial issues her ABIs in the clinic last time were quite normal bilaterally. She does have chronic kidney disease stage III and lumbar radiculopathy. 9/21; patient presents for follow-up. She presented last week with open wounds to her legs bilaterally. Silver alginate under 3 layer compression was used. Her left lower extremity wounds have healed. She has a juxta light compression at home. She still has open wounds on the right lower extremity. She denies signs of infection. She tolerated the compression wraps well. Patient History Information obtained from Chart. Family History Diabetes - Siblings, Heart Disease - Father,Siblings. Social History Never smoker, Alcohol Use - Rarely - sober since 2006. Medical History Hematologic/Lymphatic Patient has history of Anemia Respiratory Patient has history of Asthma, Sleep Apnea - 2018- oCPAP Cardiovascular Patient has history of Congestive Heart Failure, Coronary Artery Disease, Deep Vein Thrombosis - 2014 both  legs, Hypertension, Myocardial Infarction - 2005 Musculoskeletal Patient has history of Gout, Osteoarthritis Neurologic Patient has history of Seizure Disorder - as a teenager Hospitalization/Surgery History - heart cath 2018, 2003, 2004, 2006. - right total hip replacement 2016. - hysterectomy. Medical A Surgical History Notes nd Constitutional Symptoms (General Health) chronic back pain Migraine last 2018 Cardiovascular hyperlipidemia Gastrointestinal GERD Genitourinary stage III CKD Musculoskeletal DJD Psychiatric depression Objective Constitutional respirations regular, non-labored and within target range for patient.. Vitals Time Taken: 9:13 AM, Temperature: 98.4 F, Pulse: 71 bpm, Respiratory Rate: 18 breaths/min, Blood Pressure: 131/83 mmHg. Cardiovascular 2+ dorsalis pedis/posterior tibialis pulses. Psychiatric pleasant and cooperative. General Notes: Left lower extremity: Epithelization to the previous wound site on the anterior distal aspect Right lower extremity: T the posterior and Lateral o aspect there are open wounds with granulation tissue present. No signs of surrounding infection to any of the wound beds. Good edema control. Integumentary (Hair, Skin) Wound #3 status is Open. Original cause of wound was Blister. The date acquired was: 10/23/2021. The wound has been in treatment 1 weeks. The wound is located on the Left,Anterior Lower Leg. The wound measures 0cm length x 0cm width x 0cm depth; 0cm^2 area and 0cm^3 volume. There is Fat Layer (Subcutaneous Tissue) exposed. There is a medium amount of serous drainage noted. The wound margin is distinct with the outline attached to the wound base. There is large (67-100%) red granulation within the wound bed. There is a Palmer (1-33%) amount of necrotic tissue within the wound bed including Adherent Slough. Wound #4 status is Open. Original cause of wound was Blister. The date acquired was: 10/23/2021. The wound has  been in treatment 1 weeks. The wound is located on the Right,Anterior Lower Leg. The wound measures 0cm length x 0cm width x 0cm depth; 0cm^2 area and 0cm^3 volume. There is Fat Layer (Subcutaneous Tissue) exposed. There is a medium amount of serous drainage noted. The wound margin is distinct with the outline attached to the wound base. There is large (67-100%) red granulation within the wound bed. There is a Palmer (1-33%) amount of necrotic tissue within the wound bed including Adherent Slough. Wound #5 status is Open. Original cause of wound was Blister. The date acquired was: 10/23/2021. The wound has been in treatment 1 weeks. The wound is located on the Right,Lateral Lower Leg. The wound measures 0.5cm length x 0.5cm width x 0.1cm depth; 0.196cm^2 area and  0.02cm^3 volume. There is Fat Layer (Subcutaneous Tissue) exposed. There is no tunneling or undermining noted. There is a medium amount of serous drainage noted. The wound margin is distinct with the outline attached to the wound base. There is large (67-100%) red granulation within the wound bed. There is a Palmer (1-33%) amount of necrotic tissue within the wound bed including Adherent Slough. Wound #6 status is Open. Original cause of wound was Blister. The date acquired was: 10/23/2021. The wound has been in treatment 1 weeks. The wound is located on the Right,Posterior Lower Leg. The wound measures 0.5cm length x 0.4cm width x 0.1cm depth; 0.157cm^2 area and 0.016cm^3 volume. There is Fat Layer (Subcutaneous Tissue) exposed. There is a medium amount of serous drainage noted. The wound margin is distinct with the outline attached to the wound base. There is large (67-100%) red granulation within the wound bed. There is a Palmer (1-33%) amount of necrotic tissue within the wound bed including Adherent Slough. Assessment Active Problems ICD-10 Lymphedema, not elsewhere classified Non-pressure chronic ulcer of other part of right lower leg  with other specified severity Non-pressure chronic ulcer of other part of left lower leg with other specified severity Chronic venous hypertension (idiopathic) without complications of bilateral lower extremity Patient's left lower extremity wounds have healed. I recommended juxta lite compression here. We will place her in [facility] today as she does not have her juxta lite with her. T the right lower extremity there are still open wounds. I recommended silver alginate under 3 layer compression. Follow-up in 1 week. o Procedures Wound #5 Pre-procedure diagnosis of Wound #5 is a Lymphedema located on the Right,Lateral Lower Leg . There was a Three Layer Compression Therapy Procedure by Vanessa Hammock, Vanessa Palmer. Post procedure Diagnosis Wound #5: Same as Pre-Procedure Wound #6 Pre-procedure diagnosis of Wound #6 is a Lymphedema located on the Right,Posterior Lower Leg . There was a Three Layer Compression Therapy Procedure by Vanessa Hammock, Vanessa Palmer. Post procedure Diagnosis Wound #6: Same as Pre-Procedure Plan Follow-up Appointments: Return Appointment in 1 week. - with Dr. Heber [place] and Sanjuan Dame # 7 Anesthetic: (In clinic) Topical Lidocaine 5% applied to wound bed (In clinic) Topical Lidocaine 4% applied to wound bed Bathing/ Shower/ Hygiene: May shower with protection but do not get wound dressing(s) wet. - Can use cast protector bag from [facility], [facility] or [employer] Edema Control - Lymphedema / SCD / Other: Elevate legs to the level of the heart or above for 30 minutes daily and/or when sitting, a frequency of: - throughout the day Avoid standing for long periods of time. WOUND #5: - Lower Leg Wound Laterality: Right, Lateral Cleanser: Soap and Water 1 x Per Week/30 Days Discharge Instructions: May shower and wash wound with dial antibacterial soap and water prior to dressing change. Cleanser: Wound Cleanser 1 x Per Week/30 Days Discharge Instructions: Cleanse the wound with wound cleanser prior  to applying a clean dressing using gauze sponges, not tissue or cotton balls. Peri-Wound Care: Triamcinolone 15 (g) 1 x Per Week/30 Days Discharge Instructions: Use triamcinolone 15 (g) as directed Peri-Wound Care: Sween Lotion (Moisturizing lotion) 1 x Per Week/30 Days Discharge Instructions: Apply moisturizing lotion as directed Prim Dressing: KerraCel Ag Gelling Fiber Dressing, 4x5 in (silver alginate) 1 x Per Week/30 Days ary Discharge Instructions: Apply silver alginate to wound bed as instructed Secondary Dressing: ABD Pad, 8x10 1 x Per Week/30 Days Discharge Instructions: Apply over primary dressing as directed. Com pression Wrap: ThreePress (3 layer compression wrap) 1 x  Per Week/30 Days Discharge Instructions: Apply three layer compression as directed. WOUND #6: - Lower Leg Wound Laterality: Right, Posterior Cleanser: Soap and Water 1 x Per Week/30 Days Discharge Instructions: May shower and wash wound with dial antibacterial soap and water prior to dressing change. Cleanser: Wound Cleanser 1 x Per Week/30 Days Discharge Instructions: Cleanse the wound with wound cleanser prior to applying a clean dressing using gauze sponges, not tissue or cotton balls. Peri-Wound Care: Triamcinolone 15 (g) 1 x Per Week/30 Days Discharge Instructions: Use triamcinolone 15 (g) as directed Peri-Wound Care: Sween Lotion (Moisturizing lotion) 1 x Per Week/30 Days Discharge Instructions: Apply moisturizing lotion as directed Prim Dressing: KerraCel Ag Gelling Fiber Dressing, 4x5 in (silver alginate) 1 x Per Week/30 Days ary Discharge Instructions: Apply silver alginate to wound bed as instructed Secondary Dressing: ABD Pad, 8x10 1 x Per Week/30 Days Discharge Instructions: Apply over primary dressing as directed. Compression Wrap: ThreePress (3 layer compression wrap) 1 x Per Week/30 Days Discharge Instructions: Apply three layer compression as directed. 1. Silver alginate under 3 layer  compressionooright lower extremity 2. Juxta light compressionooleft lower extremity 3. Follow-up in 1 week Electronic Signature(s) Signed: 11/16/2021 10:53:41 AM By: Kalman Shan DO Entered By: Kalman Shan on 11/16/2021 10:00:56 -------------------------------------------------------------------------------- HxROS Details Patient Name: Date of Service: Vanessa Palmer, Bonnetta D. 11/16/2021 9:15 A M Medical Record Number: [id number] Patient Account Number: [id number] Date of Birth/Sex: Treating Vanessa Palmer: Apr 13, 1950 (71 y.o. F) Primary Care Provider: Maudry Mayhew Other Clinician: Referring Provider: Treating Provider/Extender: Theressa Millard in Treatment: 1 Information Obtained From Chart Constitutional Symptoms (General Health) Medical History: Past Medical History Notes: chronic back pain Migraine last 2018 Hematologic/Lymphatic Medical History: Positive for: Anemia Respiratory Medical History: Positive for: Asthma; Sleep Apnea - 2018- oCPAP Cardiovascular Medical History: Positive for: Congestive Heart Failure; Coronary Artery Disease; Deep Vein Thrombosis - 2014 both legs; Hypertension; Myocardial Infarction - 2005 Past Medical History Notes: hyperlipidemia Gastrointestinal Medical History: Past Medical History Notes: GERD Genitourinary Medical History: Past Medical History Notes: stage III CKD Musculoskeletal Medical History: Positive for: Gout; Osteoarthritis Past Medical History Notes: DJD Neurologic Medical History: Positive for: Seizure Disorder - as a teenager Psychiatric Medical History: Past Medical History Notes: depression Immunizations Pneumococcal Vaccine: Received Pneumococcal Vaccination: No Implantable Devices No devices added Hospitalization / Surgery History Type of Hospitalization/Surgery heart cath 2018, 2003, 2004, 2006 right total hip replacement 2016 hysterectomy Family and Social History Diabetes: Yes -  Siblings; Heart Disease: Yes - Father,Siblings; Never smoker; Alcohol Use: Rarely - sober since 2006 Electronic Signature(s) Signed: 11/16/2021 10:53:41 AM By: Kalman Shan DO Entered By: Kalman Shan on 11/16/2021 09:56:28 -------------------------------------------------------------------------------- SuperBill Details Patient Name: Date of Service: Vanessa Palmer, Cierra D. 11/16/2021 Medical Record Number: [id number] Patient Account Number: [id number] Date of Birth/Sex: Treating Vanessa Palmer: 13-Sep-1950 (71 y.o. Vanessa Palmer, Vanessa Palmer Primary Care Provider: Maudry Mayhew Other Clinician: Referring Provider: Treating Provider/Extender: Theressa Millard in Treatment: 1 Diagnosis Coding ICD-10 Codes Code Description I89.0 Lymphedema, not elsewhere classified L97.818 Non-pressure chronic ulcer of other part of right lower leg with other specified severity L97.828 Non-pressure chronic ulcer of other part of left lower leg with other specified severity I87.303 Chronic venous hypertension (idiopathic) without complications of bilateral lower extremity Facility Procedures CPT4 Code: [id number] Description: (Facility Use Only) [phone] - [place] RT LEG Modifier: Quantity: 1 Physician Procedures Electronic Signature(s) Signed: 11/16/2021 10:53:41 AM By: Kalman Shan DO Entered By: Kalman Shan on 11/16/2021 10:01:11

## 2021-11-17 ENCOUNTER — Ambulatory Visit: Payer: Medicare Other

## 2021-11-17 NOTE — Progress Notes (Signed)
Vanessa Palmer, Vanessa Palmer ([id number]) Visit Report for 11/16/2021 Arrival Information Details Patient Name: Date of Service: Vanessa Palmer, Vanessa Palmer 11/16/2021 9:15 A M Medical Record Number: [id number] Patient Account Number: [id number] Date of Birth/Sex: Treating RN: 1950/12/06 (71 y.o. F) Primary Care Avry Roedl: Harriet Masson Other Clinician: Referring Alyzabeth Pontillo: Treating Chase Arnall/Extender: Irineo Axon in Treatment: 1 Visit Information History Since Last Visit Added or deleted any medications: No Patient Arrived: Dan Humphreys Any new allergies or adverse reactions: No Arrival Time: 09:13 Had a fall or experienced change in No Accompanied By: daughter activities of daily living that may affect Transfer Assistance: None risk of falls: Patient Identification Verified: Yes Signs or symptoms of abuse/neglect since last visito No Secondary Verification Process Completed: Yes Hospitalized since last visit: No Patient Requires Transmission-Based Precautions: No Implantable device outside of the clinic excluding No Patient Has Alerts: Yes cellular tissue based products placed in the center Patient Alerts: Patient on Blood Thinner since last visit: ABI's= R 1.05 L1.16 Has Compression in Place as Prescribed: Yes Pain Present Now: No Electronic Signature(s) Signed: 11/17/2021 12:30:24 PM By: Thayer Dallas Entered By: Thayer Dallas on 11/16/2021 09:13:50 -------------------------------------------------------------------------------- Compression Therapy Details Patient Name: Date of Service: Vanessa Rad D. 11/16/2021 9:15 A M Medical Record Number: [id number] Patient Account Number: [id number] Date of Birth/Sex: Treating RN: November 18, 1950 (71 y.o. Vanessa Palmer, Vanessa Palmer Primary Care Najae Filsaime: Harriet Masson Other Clinician: Referring Benigno Check: Treating Madelynn Malson/Extender: Irineo Axon in Treatment: 1 Compression Therapy Performed for Wound  Assessment: Wound #5 Right,Lateral Lower Leg Performed By: Clinician Fonnie Mu, RN Compression Type: Three Layer Post Procedure Diagnosis Same as Pre-procedure Electronic Signature(s) Signed: 11/17/2021 12:35:09 PM By: Fonnie Mu RN Entered By: Fonnie Mu on 11/16/2021 09:59:45 -------------------------------------------------------------------------------- Compression Therapy Details Patient Name: Date of Service: Vanessa Rad D. 11/16/2021 9:15 A M Medical Record Number: [id number] Patient Account Number: [id number] Date of Birth/Sex: Treating RN: 01/25/1951 (71 y.o. Vanessa Palmer, Vanessa Palmer Primary Care Daemian Gahm: Harriet Masson Other Clinician: Referring Kyren Vaux: Treating Mendy Chou/Extender: Irineo Axon in Treatment: 1 Compression Therapy Performed for Wound Assessment: Wound #6 Right,Posterior Lower Leg Performed By: Clinician Fonnie Mu, RN Compression Type: Three Layer Post Procedure Diagnosis Same as Pre-procedure Electronic Signature(s) Signed: 11/17/2021 12:35:09 PM By: Fonnie Mu RN Entered By: Fonnie Mu on 11/16/2021 09:59:45 -------------------------------------------------------------------------------- Encounter Discharge Information Details Patient Name: Date of Service: Vanessa Rad D. 11/16/2021 9:15 A M Medical Record Number: [id number] Patient Account Number: [id number] Date of Birth/Sex: Treating RN: 1950/07/10 (71 y.o. Vanessa Palmer, Vanessa Palmer Primary Care Brice Potteiger: Harriet Masson Other Clinician: Referring Shayana Hornstein: Treating Deveon Kisiel/Extender: Irineo Axon in Treatment: 1 Encounter Discharge Information Items Discharge Condition: Stable Ambulatory Status: Wheelchair Discharge Destination: Home Transportation: Private Auto Accompanied By: daughter Schedule Follow-up Appointment: Yes Clinical Summary of Care: Patient Declined Electronic  Signature(s) Signed: 11/17/2021 12:35:09 PM By: Fonnie Mu RN Entered By: Fonnie Mu on 11/16/2021 10:00:40 -------------------------------------------------------------------------------- Lower Extremity Assessment Details Patient Name: Date of Service: Vanessa Rad D. 11/16/2021 9:15 A M Medical Record Number: [id number] Patient Account Number: [id number] Date of Birth/Sex: Treating RN: [birth year] (71 y.o. F) Primary Care Hailly Fess: Harriet Masson Other Clinician: Referring Shavar Gorka: Treating Zanya Lindo/Extender: Irineo Axon in Treatment: 1 Edema Assessment Assessed: [Left: No] [Right: No] Edema: [Left: Yes] [Right: Yes] Calf Left: Right: Point of Measurement: 28 cm From Medial Instep 56.5 cm 53.1 cm Ankle Left: Right: Point of Measurement: 9 cm From Medial Instep 29 cm 27.5 cm Electronic Signature(s) Signed: 11/17/2021 12:30:24  PM By: Thayer Dallas Entered By: Thayer Dallas on 11/16/2021 09:29:13 -------------------------------------------------------------------------------- Multi Wound Chart Details Patient Name: Date of Service: Vanessa Rad D. 11/16/2021 9:15 A M Medical Record Number: [id number] Patient Account Number: [id number] Date of Birth/Sex: Treating RN: [birth year] (71 y.o. F) Primary Care Fulton Merry: Harriet Masson Other Clinician: Referring Alba Perillo: Treating Luisdaniel Kenton/Extender: Irineo Axon in Treatment: 1 Vital Signs Height(in): Pulse(bpm): 71 Weight(lbs): Blood Pressure(mmHg): 131/83 Body Mass Index(BMI): Temperature(F): 98.4 Respiratory Rate(breaths/min): 18 Photos: Left, Anterior Lower Leg Right, Anterior Lower Leg Right, Lateral Lower Leg Wound Location: Blister Blister Blister Wounding Event: Lymphedema Lymphedema Lymphedema Primary Etiology: Anemia, Asthma, Sleep Apnea, Anemia, Asthma, Sleep Apnea, Anemia, Asthma, Sleep Apnea, Comorbid History: Congestive Heart  Failure, Coronary Congestive Heart Failure, Coronary Congestive Heart Failure, Coronary Artery Disease, Deep Vein Artery Disease, Deep Vein Artery Disease, Deep Vein Thrombosis, Hypertension, Myocardial Thrombosis, Hypertension, Myocardial Thrombosis, Hypertension, Myocardial Infarction, Gout, Osteoarthritis, SeizureInfarction, Gout, Osteoarthritis, SeizureInfarction, Gout, Osteoarthritis, Seizure Disorder Disorder Disorder 10/23/2021 10/23/2021 10/23/2021 Date Acquired: Weeks of Treatment: Open Open Open Wound Status: No No No Wound Recurrence: 0x0x0 0x0x0 0.5x0.5x0.1 Measurements L x W x D (cm) 0 0 0.196 A (cm) : rea 0 0 0.02 Volume (cm) : 100.00% 100.00% 49.10% % Reduction in Area: 100.00% 100.00% 47.40% % Reduction in Volume: Full Thickness Without Exposed Full Thickness Without Exposed Full Thickness Without Exposed Classification: Support Structures Support Structures Support Structures Medium Medium Medium Exudate Amount: Serous Serous Serous Exudate Type: Psychologist, forensic Exudate Color: Distinct, outline attached Distinct, outline attached Distinct, outline attached Wound Margin: Large (67-100%) Large (67-100%) Large (67-100%) Granulation Amount: Red Red Red Granulation Quality: Small (1-33%) Small (1-33%) Small (1-33%) Necrotic Amount: Fat Layer (Subcutaneous Tissue): Yes Fat Layer (Subcutaneous Tissue): Yes Fat Layer (Subcutaneous Tissue): Yes Exposed Structures: Fascia: No Fascia: No Fascia: No Tendon: No Tendon: No Tendon: No Muscle: No Muscle: No Muscle: No Joint: No Joint: No Joint: No Bone: No Bone: No Bone: No None None None Epithelialization: Wound Number: 6 N/A N/A Photos: N/A N/A Right, Posterior Lower Leg N/A N/A Wound Location: Blister N/A N/A Wounding Event: Lymphedema N/A N/A Primary Etiology: Anemia, Asthma, Sleep Apnea, N/A N/A Comorbid History: Congestive Heart Failure, Coronary Artery Disease, Deep  Vein Thrombosis, Hypertension, Myocardial Infarction, Gout, Osteoarthritis, Seizure Disorder 10/23/2021 N/A N/A Date Acquired: 1 N/A N/A Weeks of Treatment: Open N/A N/A Wound Status: No N/A N/A Wound Recurrence: 0.5x0.4x0.1 N/A N/A Measurements L x W x D (cm) 0.157 N/A N/A A (cm) : rea 0.016 N/A N/A Volume (cm) : 0.00% N/A N/A % Reduction in Area: 0.00% N/A N/A % Reduction in Volume: Full Thickness Without Exposed N/A N/A Classification: Support Structures Medium N/A N/A Exudate Amount: Serous N/A N/A Exudate Type: amber N/A N/A Exudate Color: Distinct, outline attached N/A N/A Wound Margin: Large (67-100%) N/A N/A Granulation Amount: Red N/A N/A Granulation Quality: Small (1-33%) N/A N/A Necrotic Amount: Fat Layer (Subcutaneous Tissue): Yes N/A N/A Exposed Structures: Fascia: No Tendon: No Muscle: No Joint: No Bone: No None N/A N/A Epithelialization: Treatment Notes Electronic Signature(s) Signed: 11/16/2021 10:53:41 AM By: Geralyn Corwin DO Entered By: Geralyn Corwin on 11/16/2021 09:55:08 -------------------------------------------------------------------------------- Multi-Disciplinary Care Plan Details Patient Name: Date of Service: Vanessa Rad D. 11/16/2021 9:15 A M Medical Record Number: [id number] Patient Account Number: [id number] Date of Birth/Sex: Treating RN: May 03, 1950 (71 y.o. Vanessa Palmer, Vanessa Palmer Primary Care Lakysha Kossman: Harriet Masson Other Clinician: Referring Rosealee Recinos: Treating Media Pizzini/Extender: Irineo Axon in Treatment: 1 Active Inactive  Venous Leg Ulcer Nursing Diagnoses: Actual venous Insuffiency (use after diagnosis is confirmed) Goals: Patient will maintain optimal edema control Date Initiated: 11/09/2021 Target Resolution Date: 12/07/2021 Goal Status: Active Interventions: Assess peripheral edema status every visit. Compression as ordered Provide education on venous  insufficiency Treatment Activities: Therapeutic compression applied : 11/09/2021 Notes: Wound/Skin Impairment Nursing Diagnoses: Impaired tissue integrity Goals: Patient/caregiver will verbalize understanding of skin care regimen Date Initiated: 11/09/2021 Target Resolution Date: 12/07/2021 Goal Status: Active Ulcer/skin breakdown will have a volume reduction of 30% by week 4 Date Initiated: 11/09/2021 Target Resolution Date: 12/06/2021 Goal Status: Active Interventions: Assess patient/caregiver ability to obtain necessary supplies Assess patient/caregiver ability to perform ulcer/skin care regimen upon admission and as needed Assess ulceration(s) every visit Provide education on ulcer and skin care Treatment Activities: Topical wound management initiated : 11/09/2021 Notes: Electronic Signature(s) Signed: 11/17/2021 12:35:09 PM By: Fonnie Mu RN Entered By: Fonnie Mu on 11/16/2021 09:57:16 -------------------------------------------------------------------------------- Pain Assessment Details Patient Name: Date of Service: Vanessa Rad D. 11/16/2021 9:15 A M Medical Record Number: [id number] Patient Account Number: [id number] Date of Birth/Sex: Treating RN: May 12, 1950 (71 y.o. F) Primary Care Elester Apodaca: Harriet Masson Other Clinician: Referring Severn Goddard: Treating Burlon Centrella/Extender: Irineo Axon in Treatment: 1 Active Problems Location of Pain Severity and Description of Pain Patient Has Paino No Site Locations Pain Management and Medication Current Pain Management: Electronic Signature(s) Signed: 11/17/2021 12:30:24 PM By: Thayer Dallas Entered By: Thayer Dallas on 11/16/2021 09:13:57 -------------------------------------------------------------------------------- Patient/Caregiver Education Details Patient Name: Date of Service: Vanessa Rad D. 9/21/2023andnbsp9:15 A M Medical Record Number: [id number] Patient  Account Number: [id number] Date of Birth/Gender: Treating RN: 28-Sep-1950 (71 y.o. Vanessa Palmer, Vanessa Palmer Primary Care Physician: Harriet Masson Other Clinician: Referring Physician: Treating Physician/Extender: Irineo Axon in Treatment: 1 Education Assessment Education Provided To: Patient Education Topics Provided Venous: Methods: Explain/Verbal Responses: Reinforcements needed, State content correctly Electronic Signature(s) Signed: 11/17/2021 12:35:09 PM By: Fonnie Mu RN Entered By: Fonnie Mu on 11/16/2021 09:57:29 -------------------------------------------------------------------------------- Wound Assessment Details Patient Name: Date of Service: Vanessa Rad D. 11/16/2021 9:15 A M Medical Record Number: [id number] Patient Account Number: [id number] Date of Birth/Sex: Treating RN: [birth year] (71 y.o. Vanessa Palmer, Vanessa Palmer Primary Care Tennis Mckinnon: Harriet Masson Other Clinician: Referring Snyder Colavito: Treating Daelan Gatt/Extender: Irineo Axon in Treatment: 1 Wound Status Wound Number: 3 Primary Lymphedema Etiology: Wound Location: Left, Anterior Lower Leg Wound Open Wounding Event: Blister Status: Date Acquired: 10/23/2021 Comorbid Anemia, Asthma, Sleep Apnea, Congestive Heart Failure, Coronary Weeks Of Treatment: 1 History: Artery Disease, Deep Vein Thrombosis, Hypertension, Myocardial Clustered Wound: No Infarction, Gout, Osteoarthritis, Seizure Disorder Photos Wound Measurements Length: (cm) Width: (cm) Depth: (cm) Area: (cm) Volume: (cm) 0 % Reduction in Area: 100% 0 % Reduction in Volume: 100% 0 Epithelialization: None 0 0 Wound Description Classification: Full Thickness Without Exposed Support Structures Wound Margin: Distinct, outline attached Exudate Amount: Medium Exudate Type: Serous Exudate Color: amber Foul Odor After Cleansing: No Slough/Fibrino Yes Wound  Bed Granulation Amount: Large (67-100%) Exposed Structure Granulation Quality: Red Fascia Exposed: No Necrotic Amount: Small (1-33%) Fat Layer (Subcutaneous Tissue) Exposed: Yes Necrotic Quality: Adherent Slough Tendon Exposed: No Muscle Exposed: No Joint Exposed: No Bone Exposed: No Electronic Signature(s) Signed: 11/17/2021 12:30:24 PM By: Thayer Dallas Signed: 11/17/2021 12:35:09 PM By: Fonnie Mu RN Entered By: Thayer Dallas on 11/16/2021 09:35:23 -------------------------------------------------------------------------------- Wound Assessment Details Patient Name: Date of Service: Vanessa Rad D. 11/16/2021 9:15 A M Medical Record Number: [id number] Patient Account Number: [id number] Date  of Birth/Sex: Treating RN: 1950/12/06 24(70 y.o. Vanessa RowanF) Breedlove, Vanessa Palmer Primary Care Shamon Cothran: Harriet MassonMaldonado, Edgar Other Clinician: Referring Alexandrea Westergard: Treating Hadlie Gipson/Extender: Irineo AxonHoffman, Jessica Maldonado, Edgar Weeks in Treatment: 1 Wound Status Wound Number: 4 Primary Lymphedema Etiology: Wound Location: Right, Anterior Lower Leg Wound Open Wounding Event: Blister Wounding Event: Blister Status: Date Acquired: 10/23/2021 Comorbid Anemia, Asthma, Sleep Apnea, Congestive Heart Failure, Coronary Weeks Of Treatment: 1 History: Artery Disease, Deep Vein Thrombosis, Hypertension, Myocardial Clustered Wound: No Infarction, Gout, Osteoarthritis, Seizure Disorder Photos Wound Measurements Length: (cm) Width: (cm) Depth: (cm) Area: (cm) Volume: (cm) 0 % Reduction in Area: 100% 0 % Reduction in Volume: 100% 0 Epithelialization: None 0 0 Wound Description Classification: Full Thickness Without Exposed Support Structures Wound Margin: Distinct, outline attached Exudate Amount: Medium Exudate Type: Serous Exudate Color: amber Foul Odor After Cleansing: No Slough/Fibrino Yes Wound Bed Granulation Amount: Large (67-100%) Exposed Structure Granulation Quality: Red Fascia  Exposed: No Necrotic Amount: Small (1-33%) Fat Layer (Subcutaneous Tissue) Exposed: Yes Necrotic Quality: Adherent Slough Tendon Exposed: No Muscle Exposed: No Joint Exposed: No Bone Exposed: No Electronic Signature(s) Signed: 11/17/2021 12:30:24 PM By: Thayer Dallasick, Kimberly Signed: 11/17/2021 12:35:09 PM By: Fonnie MuBreedlove, Lauren RN Entered By: Thayer Dallasick, Kimberly on 11/16/2021 09:35:57 -------------------------------------------------------------------------------- Wound Assessment Details Patient Name: Date of Service: Vanessa RadSIMMO Palmer, Vanessa D. 11/16/2021 9:15 A M Medical Record Number: [id number] Patient Account Number: [id number] Date of Birth/Sex: Treating RN: 1950/12/06 57(70 y.o. Vanessa RowanF) Breedlove, Vanessa Palmer Primary Care Dallana Mavity: Harriet MassonMaldonado, Edgar Other Clinician: Referring Etai Copado: Treating Ravonda Brecheen/Extender: Irineo AxonHoffman, Jessica Maldonado, Edgar Weeks in Treatment: 1 Wound Status Wound Number: 5 Primary Lymphedema Etiology: Wound Location: Right, Lateral Lower Leg Wound Open Wounding Event: Blister Status: Date Acquired: 10/23/2021 Comorbid Anemia, Asthma, Sleep Apnea, Congestive Heart Failure, Coronary Weeks Of Treatment: 1 History: Artery Disease, Deep Vein Thrombosis, Hypertension, Myocardial Clustered Wound: No Infarction, Gout, Osteoarthritis, Seizure Disorder Photos Wound Measurements Length: (cm) 0.5 Width: (cm) 0.5 Depth: (cm) 0.1 Area: (cm) 0.196 Volume: (cm) 0.02 % Reduction in Area: 49.1% % Reduction in Volume: 47.4% Epithelialization: None Tunneling: No Undermining: No Wound Description Classification: Full Thickness Without Exposed Support Structures Wound Margin: Distinct, outline attached Exudate Amount: Medium Exudate Type: Serous Exudate Color: amber Foul Odor After Cleansing: No Slough/Fibrino Yes Wound Bed Granulation Amount: Large (67-100%) Exposed Structure Granulation Quality: Red Fascia Exposed: No Necrotic Amount: Small (1-33%) Fat Layer (Subcutaneous  Tissue) Exposed: Yes Necrotic Quality: Adherent Slough Tendon Exposed: No Muscle Exposed: No Joint Exposed: No Bone Exposed: No Treatment Notes Wound #5 (Lower Leg) Wound Laterality: Right, Lateral Cleanser Soap and Water Discharge Instruction: May shower and wash wound with dial antibacterial soap and water prior to dressing change. Wound Cleanser Discharge Instruction: Cleanse the wound with wound cleanser prior to applying a clean dressing using gauze sponges, not tissue or cotton balls. Peri-Wound Care Triamcinolone 15 (g) Discharge Instruction: Use triamcinolone 15 (g) as directed Sween Lotion (Moisturizing lotion) Discharge Instruction: Apply moisturizing lotion as directed Topical Primary Dressing KerraCel Ag Gelling Fiber Dressing, 4x5 in (silver alginate) Discharge Instruction: Apply silver alginate to wound bed as instructed Secondary Dressing ABD Pad, 8x10 Discharge Instruction: Apply over primary dressing as directed. Secured With Compression Wrap ThreePress (3 layer compression wrap) Discharge Instruction: Apply three layer compression as directed. Compression Stockings Add-Ons Electronic Signature(s) Signed: 11/17/2021 12:30:24 PM By: Thayer Dallasick, Kimberly Signed: 11/17/2021 12:35:09 PM By: Fonnie MuBreedlove, Lauren RN Entered By: Thayer Dallasick, Kimberly on 11/16/2021 09:36:59 -------------------------------------------------------------------------------- Wound Assessment Details Patient Name: Date of Service: Vanessa RadSIMMO Palmer, Vanessa D. 11/16/2021 9:15 A M Medical Record Number: [id number]  Patient Account Number: [id number] Date of Birth/Sex: Treating RN: May 27, 1950 (71 y.o. F) Primary Care Kian Gamarra: Maudry Mayhew Other Clinician: Referring Peyson Postema: Treating Rayshaun Needle/Extender: Theressa Millard in Treatment: 1 Wound Status Wound Number: 6 Primary Lymphedema Etiology: Wound Location: Right, Posterior Lower Leg Wound Open Wounding Event:  Blister Status: Date Acquired: 10/23/2021 Comorbid Anemia, Asthma, Sleep Apnea, Congestive Heart Failure, Coronary Weeks Of Treatment: 1 History: Artery Disease, Deep Vein Thrombosis, Hypertension, Myocardial Clustered Wound: No Infarction, Gout, Osteoarthritis, Seizure Disorder Photos Wound Measurements Length: (cm) 0.5 Width: (cm) 0.4 Depth: (cm) 0.1 Area: (cm) 0.157 Volume: (cm) 0.016 % Reduction in Area: 0% % Reduction in Volume: 0% Epithelialization: None Wound Description Classification: Full Thickness Without Exposed Support Structures Wound Margin: Distinct, outline attached Exudate Amount: Medium Exudate Type: Serous Exudate Color: amber Foul Odor After Cleansing: No Slough/Fibrino Yes Wound Bed Granulation Amount: Large (67-100%) Exposed Structure Granulation Quality: Red Fascia Exposed: No Necrotic Amount: Small (1-33%) Fat Layer (Subcutaneous Tissue) Exposed: Yes Necrotic Quality: Adherent Slough Tendon Exposed: No Muscle Exposed: No Joint Exposed: No Bone Exposed: No Treatment Notes Wound #6 (Lower Leg) Wound Laterality: Right, Posterior Cleanser Soap and Water Discharge Instruction: May shower and wash wound with dial antibacterial soap and water prior to dressing change. Wound Cleanser Discharge Instruction: Cleanse the wound with wound cleanser prior to applying a clean dressing using gauze sponges, not tissue or cotton balls. Peri-Wound Care Triamcinolone 15 (g) Discharge Instruction: Use triamcinolone 15 (g) as directed Sween Lotion (Moisturizing lotion) Discharge Instruction: Apply moisturizing lotion as directed Topical Primary Dressing KerraCel Ag Gelling Fiber Dressing, 4x5 in (silver alginate) Discharge Instruction: Apply silver alginate to wound bed as instructed Secondary Dressing ABD Pad, 8x10 Discharge Instruction: Apply over primary dressing as directed. Secured With Compression Wrap ThreePress (3 layer compression wrap) Discharge  Instruction: Apply three layer compression as directed. Compression Stockings Add-Ons Electronic Signature(s) Signed: 11/17/2021 12:30:24 PM By: Erenest Blank Entered By: Erenest Blank on 11/16/2021 09:37:47 -------------------------------------------------------------------------------- Vitals Details Patient Name: Date of Service: Vanessa Palmer, Vanessa Chick D. 11/16/2021 9:15 A M Medical Record Number: [id number] Patient Account Number: [id number] Date of Birth/Sex: Treating RN: December 28, 1950 (71 y.o. F) Primary Care Weiland Tomich: Maudry Mayhew Other Clinician: Referring Valborg Friar: Treating Akia Desroches/Extender: Theressa Millard in Treatment: 1 Vital Signs Time Taken: 09:13 Temperature (F): 98.4 Pulse (bpm): 71 Respiratory Rate (breaths/min): 18 Blood Pressure (mmHg): 131/83 Reference Range: 80 - 120 mg / dl Electronic Signature(s) Signed: 11/17/2021 12:30:24 PM By: Erenest Blank Entered By: Erenest Blank on 11/16/2021 09:13:26

## 2021-11-20 ENCOUNTER — Other Ambulatory Visit: Payer: Self-pay | Admitting: Physician Assistant

## 2021-11-20 ENCOUNTER — Telehealth: Payer: Self-pay | Admitting: Physician Assistant

## 2021-11-20 MED ORDER — TIZANIDINE HCL 4 MG PO TABS
ORAL_TABLET | ORAL | 0 refills | Status: DC
Start: 2021-11-20 — End: 2021-12-11

## 2021-11-20 NOTE — Telephone Encounter (Signed)
Pt called requesting a refill of muscle relaxer. Please send to pharmacy on file. Pt phone number is [phone].

## 2021-11-23 ENCOUNTER — Encounter (HOSPITAL_BASED_OUTPATIENT_CLINIC_OR_DEPARTMENT_OTHER): Payer: Medicare Other | Admitting: Internal Medicine

## 2021-11-23 DIAGNOSIS — I87303 Chronic venous hypertension (idiopathic) without complications of bilateral lower extremity: Secondary | ICD-10-CM | POA: Diagnosis not present

## 2021-11-23 NOTE — Progress Notes (Signed)
DOMINO, FONTANA ([id number]) Visit Report for 11/23/2021 Arrival Information Details Patient Name: Date of Service: Vanessa Palmer, Vanessa Palmer 11/23/2021 2:30 PM Medical Record Number: [id number] Patient Account Number: [id number] Date of Birth/Sex: Treating RN: 24-Jun-1950 (71 y.o. Vanessa Palmer Vanessa Palmer, Vanessa Palmer Primary Care Vanessa Palmer: Vanessa Palmer Other Clinician: Referring Hyun Reali: Treating Jobeth Pangilinan/Extender: Duffy Rhody in Treatment: 2 Visit Information History Since Last Visit Added or deleted any medications: No Patient Arrived: Wheel Chair Any new allergies or adverse reactions: No Arrival Time: 14:21 Had a fall or experienced change in No Accompanied By: Daughter activities of daily living that may affect Transfer Assistance: Manual risk of falls: Patient Identification Verified: Yes Signs or symptoms of abuse/neglect since last visito No Secondary Verification Process Completed: Yes Hospitalized since last visit: No Patient Requires Transmission-Based Precautions: No Implantable device outside of the clinic excluding No Patient Has Alerts: Yes cellular tissue based products placed in the center Patient Alerts: Patient on Blood Thinner since last visit: ABI's= R 1.05 L1.16 Has Dressing in Place as Prescribed: Yes Pain Present Now: Yes Electronic Signature(s) Signed: 11/23/2021 4:19:15 PM By: Rhae Hammock RN Entered By: Rhae Hammock on 11/23/2021 14:22:47 -------------------------------------------------------------------------------- Compression Therapy Details Patient Name: Date of Service: Vanessa Small D. 11/23/2021 2:30 PM Medical Record Number: [id number] Patient Account Number: [id number] Date of Birth/Sex: Treating RN: [birth year] (70 y.o. Vanessa Palmer Vanessa Palmer, Vanessa Palmer Primary Care Brooks Kinnan: Vanessa Palmer Other Clinician: Referring Yehoshua Vitelli: Treating Othel Dicostanzo/Extender: Duffy Rhody in Treatment: 2 Compression  Therapy Performed for Wound Assessment: Wound #5 Right,Lateral Lower Leg Performed By: Clinician Rhae Hammock, RN Compression Type: Four Layer Post Procedure Diagnosis Same as Pre-procedure Electronic Signature(s) Signed: 11/23/2021 4:19:15 PM By: Rhae Hammock RN Entered By: Rhae Hammock on 11/23/2021 15:03:27 -------------------------------------------------------------------------------- Compression Therapy Details Patient Name: Date of Service: Vanessa Small D. 11/23/2021 2:30 PM Medical Record Number: [id number] Patient Account Number: [id number] Date of Birth/Sex: Treating RN: 1950-06-03 (71 y.o. Vanessa Palmer Vanessa Palmer, Vanessa Palmer Primary Care Doral Digangi: Vanessa Palmer Other Clinician: Referring Kaleiyah Polsky: Treating Pheonix Clinkscale/Extender: Duffy Rhody in Treatment: 2 Compression Therapy Performed for Wound Assessment: Wound #6 Right,Posterior Lower Leg Performed By: Clinician Rhae Hammock, RN Compression Type: Four Layer Post Procedure Diagnosis Same as Pre-procedure Electronic Signature(s) Signed: 11/23/2021 4:19:15 PM By: Rhae Hammock RN Entered By: Rhae Hammock on 11/23/2021 15:03:28 -------------------------------------------------------------------------------- Compression Therapy Details Patient Name: Date of Service: Vanessa Small D. 11/23/2021 2:30 PM Medical Record Number: [id number] Patient Account Number: [id number] Date of Birth/Sex: Treating RN: Aug 26, 1950 (70 y.o. Vanessa Palmer Vanessa Palmer, Vanessa Palmer Primary Care Asaiah Hunnicutt: Vanessa Palmer Other Clinician: Referring Tinleigh Whitmire: Treating Christina Waldrop/Extender: Duffy Rhody in Treatment: 2 Compression Therapy Performed for Wound Assessment: Wound #7 Left,Posterior Lower Leg Performed By: Clinician Rhae Hammock, RN Compression Type: Four Layer Post Procedure Diagnosis Same as Pre-procedure Electronic Signature(s) Signed: 11/23/2021 4:19:15 PM By: Rhae Hammock RN Entered By: Rhae Hammock on 11/23/2021 15:03:28 -------------------------------------------------------------------------------- Encounter Discharge Information Details Patient Name: Date of Service: Vanessa Small D. 11/23/2021 2:30 PM Medical Record Number: [id number] Patient Account Number: [id number] Date of Birth/Sex: Treating RN: [birth year] (71 y.o. Vanessa Palmer Vanessa Palmer, Vanessa Palmer Primary Care Ismar Yabut: Vanessa Palmer Other Clinician: Referring Jahmere Bramel: Treating Nasire Reali/Extender: Duffy Rhody in Treatment: 2 Encounter Discharge Information Items Discharge Condition: Stable Ambulatory Status: Wheelchair Discharge Destination: Home Transportation: Private Auto Accompanied By: daughter Schedule Follow-up Appointment: Yes Clinical Summary of Care: Patient Declined Electronic Signature(s) Signed: 11/23/2021 4:19:15 PM By: Rhae Hammock RN Entered By: Rhae Hammock on 11/23/2021 15:08:40 -------------------------------------------------------------------------------- Lower Extremity  Assessment Details Patient Name: Date of Service: Vanessa Palmer, Vanessa Palmer 11/23/2021 2:30 PM Medical Record Number: [id number] Patient Account Number: [id number] Date of Birth/Sex: Treating RN: Apr 30, 1950 (71 y.o. Vanessa Palmer Vanessa Palmer, Vanessa Palmer Primary Care Gianah Batt: Vanessa Palmer Other Clinician: Referring Demetric Parslow: Treating Eliazar Olivar/Extender: Duffy Rhody in Treatment: 2 Edema Assessment Assessed: [name]: Yes] [name]: Yes] Edema: [Left: Yes] [Right: Yes] Calf Left: Right: Point of Measurement: 28 cm From Medial Instep 55 cm 54 cm Ankle Left: Right: Point of Measurement: 9 cm From Medial Instep 29 cm 26.2 cm Vascular Assessment Pulses: Dorsalis Pedis Palpable: [Left:Yes] [Right:Yes] Posterior Tibial Palpable: [Left:Yes] [Right:Yes] Electronic Signature(s) Signed: 11/23/2021 4:19:15 PM By: Rhae Hammock RN Entered By:  Rhae Hammock on 11/23/2021 14:27:38 -------------------------------------------------------------------------------- Multi Wound Chart Details Patient Name: Date of Service: Vanessa Small D. 11/23/2021 2:30 PM Medical Record Number: [id number] Patient Account Number: [id number] Date of Birth/Sex: Treating RN: 09-11-1950 (70 y.o. F) Primary Care Tyquarius Paglia: Vanessa Palmer Other Clinician: Referring Margery Szostak: Treating Zollie Ellery/Extender: Duffy Rhody in Treatment: 2 Vital Signs Height(in): Pulse(bpm): 48 Weight(lbs): Blood Pressure(mmHg): 119/69 Body Mass Index(BMI): Temperature(F): 98.7 Respiratory Rate(breaths/min): 17 Photos: Right, Lateral Lower Leg Right, Posterior Lower Leg Left, Posterior Lower Leg Wound Location: Blister Blister Gradually Appeared Wounding Event: Lymphedema Lymphedema Lymphedema Primary Etiology: Anemia, Asthma, Sleep Apnea, Anemia, Asthma, Sleep Apnea, Anemia, Asthma, Sleep Apnea, Comorbid History: Congestive Heart Failure, Coronary Congestive Heart Failure, Coronary Congestive Heart Failure, Coronary Artery Disease, Deep Vein Artery Disease, Deep Vein Artery Disease, Deep Vein Thrombosis, Hypertension, Myocardial Thrombosis, Hypertension, Myocardial Thrombosis, Hypertension, Myocardial Infarction, Gout, Osteoarthritis, SeizureInfarction, Gout, Osteoarthritis, SeizureInfarction, Gout, Osteoarthritis, Seizure Disorder Disorder Disorder 10/23/2021 10/23/2021 11/22/2021 Date Acquired: 2 2 0 Weeks of Treatment: Open Open Open Wound Status: No No No Wound Recurrence: No No Yes Clustered Wound: N/A N/A 2 Clustered Quantity: 0.1x0.1x0.1 3.7x1.3x0.1 2.5x1.5x0.1 Measurements L x W x D (cm) 0.008 3.778 2.945 A (cm) : rea 0.001 0.378 0.295 Volume (cm) : 97.90% -2306.40% N/A % Reduction in Area: 97.40% -2262.50% N/A % Reduction in Volume: Full Thickness Without Exposed Full Thickness Without Exposed Full Thickness  With Exposed Support Classification: Support Structures Support Structures Structures Medium Medium Medium Exudate Amount: Serous Serous Serosanguineous Exudate Type: amber amber red, brown Exudate Color: Distinct, outline attached Distinct, outline attached Distinct, outline attached Wound Margin: Large (67-100%) Large (67-100%) Large (67-100%) Granulation Amount: Red Red Red, Pink Granulation Quality: Small (1-33%) Small (1-33%) Small (1-33%) Necrotic Amount: Fat Layer (Subcutaneous Tissue): Yes Fat Layer (Subcutaneous Tissue): Yes Fat Layer (Subcutaneous Tissue): Yes Exposed Structures: Fascia: No Fascia: No Fascia: No Tendon: No Tendon: No Tendon: No Muscle: No Muscle: No Muscle: No Joint: No Joint: No Joint: No Bone: No Bone: No Bone: No Large (67-100%) None None Epithelialization: Compression Therapy Compression Therapy Compression Therapy Procedures Performed: Treatment Notes Electronic Signature(s) Signed: 11/23/2021 4:39:24 PM By: Linton Ham MD Entered By: Linton Ham on 11/23/2021 15:04:04 -------------------------------------------------------------------------------- Multi-Disciplinary Care Plan Details Patient Name: Date of Service: Vanessa Palmer, Vanessa Chick D. 11/23/2021 2:30 PM Medical Record Number: [id number] Patient Account Number: [id number] Date of Birth/Sex: Treating RN: 10-04-1950 (71 y.o. Vanessa Palmer Vanessa Palmer, Vanessa Palmer Primary Care Annarose Ouellet: Vanessa Palmer Other Clinician: Referring Yeira Gulden: Treating Deke Tilghman/Extender: Duffy Rhody in Treatment: 2 Active Inactive Venous Leg Ulcer Nursing Diagnoses: Actual venous Insuffiency (use after diagnosis is confirmed) Goals: Patient will maintain optimal edema control Date Initiated: 11/09/2021 Target Resolution Date: 12/07/2021 Goal Status: Active Interventions: Assess peripheral edema status every visit. Compression as ordered Provide education on venous  insufficiency  Treatment Activities: Therapeutic compression applied : 11/09/2021 Notes: Wound/Skin Impairment Nursing Diagnoses: Impaired tissue integrity Goals: Patient/caregiver will verbalize understanding of skin care regimen Date Initiated: 11/09/2021 Target Resolution Date: 12/07/2021 Goal Status: Active Ulcer/skin breakdown will have a volume reduction of 30% by week 4 Date Initiated: 11/09/2021 Target Resolution Date: 12/06/2021 Goal Status: Active Interventions: Assess patient/caregiver ability to obtain necessary supplies Assess patient/caregiver ability to perform ulcer/skin care regimen upon admission and as needed Assess ulceration(s) every visit Provide education on ulcer and skin care Treatment Activities: Topical wound management initiated : 11/09/2021 Notes: Electronic Signature(s) Signed: 11/23/2021 4:19:15 PM By: Rhae Hammock RN Entered By: Rhae Hammock on 11/23/2021 15:03:57 -------------------------------------------------------------------------------- Pain Assessment Details Patient Name: Date of Service: Vanessa Small D. 11/23/2021 2:30 PM Medical Record Number: [id number] Patient Account Number: [id number] Date of Birth/Sex: Treating RN: [birth year] (71 y.o. Vanessa Palmer Vanessa Palmer, Vanessa Palmer Primary Care Seraphim Affinito: Vanessa Palmer Other Clinician: Referring Brenda Cowher: Treating Jeryn Cerney/Extender: Duffy Rhody in Treatment: 2 Active Problems Location of Pain Severity and Description of Pain Patient Has Paino Yes Site Locations Pain Location: Pain Location: Generalized Pain, Pain in Ulcers With Dressing Change: Yes Duration of the Pain. Constant / Intermittento Intermittent Rate the pain. Current Pain Level: 6 Worst Pain Level: 10 Least Pain Level: 0 Tolerable Pain Level: 6 Character of Pain Describe the Pain: Aching Pain Management and Medication Current Pain Management: Medication: No Cold Application: No Rest:  No Massage: No Activity: No T.E.N.S.: No Heat Application: No Leg drop or elevation: No Is the Current Pain Management Adequate: Adequate How does your wound impact your activities of daily livingo Sleep: No Bathing: No Appetite: No Relationship With Others: No Bladder Continence: No Emotions: No Bowel Continence: No Work: No Toileting: No Drive: No Dressing: No Hobbies: No Electronic Signature(s) Signed: 11/23/2021 4:19:15 PM By: Rhae Hammock RN Entered By: Rhae Hammock on 11/23/2021 14:24:16 -------------------------------------------------------------------------------- Patient/Caregiver Education Details Patient Name: Date of Service: Vanessa Small D. 9/28/2023andnbsp2:30 PM Medical Record Number: [id number] Patient Account Number: [id number] Date of Birth/Gender: Treating RN: [birth year] (71 y.o. Vanessa Palmer Vanessa Palmer, Vanessa Palmer Primary Care Physician: Vanessa Palmer Other Clinician: Referring Physician: Treating Physician/Extender: Duffy Rhody in Treatment: 2 Education Assessment Education Provided To: Patient Education Topics Provided Venous: Methods: Explain/Verbal Responses: Reinforcements needed, State content correctly Electronic Signature(s) Signed: 11/23/2021 4:19:15 PM By: Rhae Hammock RN Entered By: Rhae Hammock on 11/23/2021 15:06:53 -------------------------------------------------------------------------------- Wound Assessment Details Patient Name: Date of Service: Vanessa Small D. 11/23/2021 2:30 PM Medical Record Number: [id number] Patient Account Number: [id number] Date of Birth/Sex: Treating RN: 08/14/1950 (70 y.o. Vanessa Palmer Vanessa Palmer, Vanessa Palmer Primary Care Candon Caras: Vanessa Palmer Other Clinician: Referring Bryden Darden: Treating Trevyon Swor/Extender: Duffy Rhody in Treatment: 2 Wound Status Wound Number: 5 Primary Lymphedema Etiology: Wound Location: Right, Lateral Lower  Leg Wound Open Wounding Event: Blister Status: Date Acquired: 10/23/2021 Comorbid Anemia, Asthma, Sleep Apnea, Congestive Heart Failure, Coronary Weeks Of Treatment: 2 History: Artery Disease, Deep Vein Thrombosis, Hypertension, Myocardial Clustered Wound: No Infarction, Gout, Osteoarthritis, Seizure Disorder Photos Wound Measurements Length: (cm) 0.1 Width: (cm) 0.1 Depth: (cm) 0.1 Area: (cm) 0.008 Volume: (cm) 0.001 % Reduction in Area: 97.9% % Reduction in Volume: 97.4% Epithelialization: Large (67-100%) Tunneling: No Undermining: No Wound Description Classification: Full Thickness Without Exposed Support Structures Wound Margin: Distinct, outline attached Exudate Amount: Medium Exudate Type: Serous Exudate Color: amber Foul Odor After Cleansing: No Slough/Fibrino Yes Wound Bed Granulation Amount: Large (67-100%) Exposed Structure Granulation Quality: Red Fascia Exposed: No Necrotic Amount: Small (1-33%)  Fat Layer (Subcutaneous Tissue) Exposed: Yes Necrotic Quality: Adherent Slough Tendon Exposed: No Muscle Exposed: No Joint Exposed: No Bone Exposed: No Treatment Notes Wound #5 (Lower Leg) Wound Laterality: Right, Lateral Cleanser Soap and Water Discharge Instruction: May shower and wash wound with dial antibacterial soap and water prior to dressing change. Wound Cleanser Discharge Instruction: Cleanse the wound with wound cleanser prior to applying a clean dressing using gauze sponges, not tissue or cotton balls. Peri-Wound Care Triamcinolone 15 (g) Discharge Instruction: Use triamcinolone 15 (g) as directed Sween Lotion (Moisturizing lotion) Discharge Instruction: Apply moisturizing lotion as directed Topical Primary Dressing KerraCel Ag Gelling Fiber Dressing, 4x5 in (silver alginate) Discharge Instruction: Apply silver alginate to wound bed as instructed Secondary Dressing ABD Pad, 8x10 Discharge Instruction: Apply over primary dressing as  directed. Secured With Compression Wrap ThreePress (3 layer compression wrap) Discharge Instruction: Apply three layer compression as directed. Compression Stockings Add-Ons Electronic Signature(s) Signed: 11/23/2021 4:19:15 PM By: Rhae Hammock RN Entered By: Rhae Hammock on 11/23/2021 14:31:59 -------------------------------------------------------------------------------- Wound Assessment Details Patient Name: Date of Service: Vanessa Small D. 11/23/2021 2:30 PM Medical Record Number: [id number] Patient Account Number: [id number] Date of Birth/Sex: Treating RN: Apr 28, 1950 (71 y.o. Vanessa Palmer Vanessa Palmer, Vanessa Palmer Primary Care Braxdon Gappa: Maudry Palmer Other Clinician: Referring Evetta Renner: Treating Penny Arrambide/Extender: Duffy Rhody in Treatment: 2 Wound Status Wound Number: 6 Primary Lymphedema Etiology: Wound Location: Right, Posterior Lower Leg Wound Open Wounding Event: Blister Status: Date Acquired: 10/23/2021 Comorbid Anemia, Asthma, Sleep Apnea, Congestive Heart Failure, Coronary Weeks Of Treatment: 2 History: Artery Disease, Deep Vein Thrombosis, Hypertension, Myocardial Clustered Wound: No Infarction, Gout, Osteoarthritis, Seizure Disorder Photos Wound Measurements Length: (cm) 3.7 Width: (cm) 1.3 Depth: (cm) 0.1 Area: (cm) 3.778 Volume: (cm) 0.378 % Reduction in Area: -2306.4% % Reduction in Volume: -2262.5% Epithelialization: None Tunneling: No Undermining: No Wound Description Classification: Full Thickness Without Exposed Support Structures Wound Margin: Distinct, outline attached Exudate Amount: Medium Exudate Type: Serous Exudate Color: amber Foul Odor After Cleansing: No Slough/Fibrino Yes Wound Bed Granulation Amount: Large (67-100%) Exposed Structure Granulation Quality: Red Fascia Exposed: No Necrotic Amount: Small (1-33%) Fat Layer (Subcutaneous Tissue) Exposed: Yes Necrotic Quality: Adherent Slough Tendon  Exposed: No Muscle Exposed: No Joint Exposed: No Bone Exposed: No Treatment Notes Wound #6 (Lower Leg) Wound Laterality: Right, Posterior Cleanser Soap and Water Discharge Instruction: May shower and wash wound with dial antibacterial soap and water prior to dressing change. Wound Cleanser Discharge Instruction: Cleanse the wound with wound cleanser prior to applying a clean dressing using gauze sponges, not tissue or cotton balls. Peri-Wound Care Triamcinolone 15 (g) Discharge Instruction: Use triamcinolone 15 (g) as directed Sween Lotion (Moisturizing lotion) Discharge Instruction: Apply moisturizing lotion as directed Topical Primary Dressing KerraCel Ag Gelling Fiber Dressing, 4x5 in (silver alginate) Discharge Instruction: Apply silver alginate to wound bed as instructed Secondary Dressing ABD Pad, 8x10 Discharge Instruction: Apply over primary dressing as directed. Secured With Compression Wrap ThreePress (3 layer compression wrap) Discharge Instruction: Apply three layer compression as directed. Compression Stockings Add-Ons Electronic Signature(s) Signed: 11/23/2021 4:19:15 PM By: Rhae Hammock RN Entered By: Rhae Hammock on 11/23/2021 14:32:28 -------------------------------------------------------------------------------- Wound Assessment Details Patient Name: Date of Service: Vanessa Small D. 11/23/2021 2:30 PM Medical Record Number: [id number] Patient Account Number: [id number] Date of Birth/Sex: Treating RN: December 22, 1950 (71 y.o. Vanessa Palmer Vanessa Palmer, Vanessa Palmer Primary Care Xan Ingraham: Vanessa Palmer Other Clinician: Referring Aleila Syverson: Treating Chitara Clonch/Extender: Duffy Rhody in Treatment: 2 Wound Status Wound Number: 7 Primary Lymphedema Etiology: Wound Location:  Left, Posterior Lower Leg Wound Open Wounding Event: Gradually Appeared Status: Date Acquired: 11/22/2021 Comorbid Anemia, Asthma, Sleep Apnea, Congestive Heart  Failure, Coronary Weeks Of Treatment: 0 History: Artery Disease, Deep Vein Thrombosis, Hypertension, Myocardial Clustered Wound: Yes Infarction, Gout, Osteoarthritis, Seizure Disorder Wound Measurements Length: (cm) 2.5 Width: (cm) 1.5 Depth: (cm) 0.1 Clustered Quantity: 2 Area: (cm) 2.945 Volume: (cm) 0.295 % Reduction in Area: % Reduction in Volume: Epithelialization: None Tunneling: No Undermining: No Wound Description Classification: Full Thickness With Exposed Support Structures Wound Margin: Distinct, outline attached Exudate Amount: Medium Exudate Type: Serosanguineous Exudate Color: red, brown Foul Odor After Cleansing: No Slough/Fibrino Yes Wound Bed Granulation Amount: Large (67-100%) Exposed Structure Granulation Quality: Red, Pink Fascia Exposed: No Necrotic Amount: Small (1-33%) Fat Layer (Subcutaneous Tissue) Exposed: Yes Necrotic Quality: Adherent Slough Tendon Exposed: No Muscle Exposed: No Joint Exposed: No Bone Exposed: No Treatment Notes Wound #7 (Lower Leg) Wound Laterality: Left, Posterior Cleanser Soap and Water Discharge Instruction: May shower and wash wound with dial antibacterial soap and water prior to dressing change. Wound Cleanser Discharge Instruction: Cleanse the wound with wound cleanser prior to applying a clean dressing using gauze sponges, not tissue or cotton balls. Peri-Wound Care Triamcinolone 15 (g) Discharge Instruction: Use triamcinolone 15 (g) as directed Sween Lotion (Moisturizing lotion) Discharge Instruction: Apply moisturizing lotion as directed Topical Primary Dressing KerraCel Ag Gelling Fiber Dressing, 4x5 in (silver alginate) Discharge Instruction: Apply silver alginate to wound bed as instructed Secondary Dressing ABD Pad, 8x10 Discharge Instruction: Apply over primary dressing as directed. Secured With Compression Wrap FourPress (4 layer compression wrap) Discharge Instruction: Apply four layer compression  as directed. May also use Miliken CoFlex 2 layer compression system as alternative. Compression Stockings Add-Ons Electronic Signature(s) Signed: 11/23/2021 4:19:15 PM By: Rhae Hammock RN Entered By: Rhae Hammock on 11/23/2021 14:37:39 -------------------------------------------------------------------------------- Vitals Details Patient Name: Date of Service: Vanessa Palmer, Vanessa Chick D. 11/23/2021 2:30 PM Medical Record Number: [id number] Patient Account Number: [id number] Date of Birth/Sex: Treating RN: 1950/08/23 (71 y.o. Vanessa Palmer Vanessa Palmer, Vanessa Palmer Primary Care Alonia Dibuono: Vanessa Palmer Other Clinician: Referring [place]il[place] Mcphee: Treating Drayton Tieu/Extender: Duffy Rhody in Treatment: 2 Vital Signs Time Taken: 14:23 Temperature (F): 98.7 Pulse (bpm): 70 Respiratory Rate (breaths/min): 17 Blood Pressure (mmHg): 119/69 Reference Range: 80 - 120 mg / dl Electronic Signature(s) Signed: 11/23/2021 4:19:15 PM By: Rhae Hammock RN Entered By: Rhae Hammock on 11/23/2021 14:23:30

## 2021-11-23 NOTE — Progress Notes (Signed)
Vanessa Palmer, SLATE ([id number]) Visit Report for 11/23/2021 HPI Details Patient Name: Date of Service: Vanessa Palmer, Vanessa Palmer 11/23/2021 2:30 PM Medical Record Number: [id number] Patient Account Number: [id number] Date of Birth/Sex: Treating RN: October 15, 1950 (71 y.o. F) Primary Care Provider: Maudry Mayhew Other Clinician: Referring Provider: Treating Provider/Extender: Duffy Rhody in Treatment: 2 History of Present Illness HPI Description: Admission 09/07/2021 Ms. Markeeta Loux is a 71 year old female with a past medical history of essential hypertension, chronic kidney disease stage III, and lymphedema that presents to the clinic for a 2-week history of scattered open wounds to her lower extremities bilaterally. She states this has been an ongoing issue for the past year. She states that the wounds wax and wane in healing. She currently takes torsemide. She does not wear compression stockings. She currently denies signs of infection. 7/20; patient presents for follow-up. She received 1 juxta lite compression wrap in the mail. She was supposed to receive 2. She states she talked to the company and they are sending her her second pair. We used antibiotic ointment and silver alginate under 3 layer compression at last clinic visit. The silver alginate stuck to the wound bed. The left lower extremity wounds have healed. 7/27; patient presents for follow-up. She still has not received her juxta lite compression for the right leg. We spoke with the wound care supply company today and they stated it will arrive today. We have been using Xeroform under 3 layer compression to the right lower extremity. She has been using the juxta lite compression to the left lower extremity. Her wounds remain healed to the left lower extremity. READMISSION 11/09/2021 This is a patient that we had in clinic in July. She has lymphedema secondary to chronic venous insufficiency and had scattered  open areas on her bilateral lower extremities she received 3 layer compression bilaterally the area is closed over and she was prescribed juxta lite stockings. She tells me she was compliant with the juxta lites, apparently she has home health aides to help her put these on. About 3 weeks ago she noted increase in swelling and weeping areas in her bilateral lower legs and she is in for review of this. She does not have arterial issues her ABIs in the clinic last time were quite normal bilaterally. She does have chronic kidney disease stage III and lumbar radiculopathy. 9/21; patient presents for follow-up. She presented last week with open wounds to her legs bilaterally. Silver alginate under 3 layer compression was used. Her left lower extremity wounds have healed. She has a juxta light compression at home. She still has open wounds on the right lower extremity. She denies signs of infection. She tolerated the compression wraps well. 9/28; as I understand things we actually healed out her left leg last week and transitioned her to juxta lites however almost immediately fluid ripped out of several open areas on the left leg. She comes in today with significant bilateral lower extremity edema. Electronic Signature(s) Signed: 11/23/2021 4:39:24 PM By: Linton Ham MD Entered By: Linton Ham on 11/23/2021 15:06:06 -------------------------------------------------------------------------------- Physical Exam Details Patient Name: Date of Service: Vanessa Small D. 11/23/2021 2:30 PM Medical Record Number: [id number] Patient Account Number: [id number] Date of Birth/Sex: Treating RN: 06-21-1950 (71 y.o. F) Primary Care Provider: Maudry Mayhew Other Clinician: Referring Provider: Treating Provider/Extender: Duffy Rhody in Treatment: 2 Constitutional Sitting or standing Blood Pressure is within target range for patient.. Pulse regular and within target range for  patient.Marland Kitchen Respirations  regular, non-labored and within target range.. Temperature is normal and within the target range for the patient.Marland Kitchen Appears in no distress. Notes Wound exam; On the left lower extremity the anterior wound was declared healed last week however she comes in this week with 3 open areas to posteriorly and 1 anteriorly weeping edema fluid. On the right leg large wound on the right posterior calf. All of this and poorly controlled lymphedema Electronic Signature(s) Signed: 11/23/2021 4:39:24 PM By: Linton Ham MD Entered By: Linton Ham on 11/23/2021 15:09:18 -------------------------------------------------------------------------------- Physician Orders Details Patient Name: Date of Service: Vanessa Small D. 11/23/2021 2:30 PM Medical Record Number: [id number] Patient Account Number: [id number] Date of Birth/Sex: Treating RN: 01-13-1951 (71 y.o. [name] Phoenix, Lauren Primary Care Provider: Maudry Mayhew Other Clinician: Referring Provider: Treating Provider/Extender: Duffy Rhody in Treatment: 2 Verbal / Phone Orders: No Diagnosis Coding Follow-up Appointments ppointment in 1 week. - with Dr. Heber [place] and Sanjuan Dame # 7 Return A Anesthetic (In clinic) Topical Lidocaine 5% applied to wound bed (In clinic) Topical Lidocaine 4% applied to wound bed Bathing/ Shower/ Hygiene May shower with protection but do not get wound dressing(s) wet. - Can use cast protector bag from [facility] or [employer] Edema Control - Lymphedema / SCD / Other Elevate legs to the level of the heart or above for 30 minutes daily and/or when sitting, a frequency of: - throughout the day Avoid standing for long periods of time. Other Edema Control Orders/Instructions: - We will try and see about ordering lymphedema pumps after you've had at least 4 weeks of wearing compression wraps!! Wound Treatment Wound #5 - Lower Leg Wound Laterality: Right,  Lateral Cleanser: Soap and Water 1 x Per Week/30 Days Discharge Instructions: May shower and wash wound with dial antibacterial soap and water prior to dressing change. Cleanser: Wound Cleanser 1 x Per Week/30 Days Discharge Instructions: Cleanse the wound with wound cleanser prior to applying a clean dressing using gauze sponges, not tissue or cotton balls. Peri-Wound Care: Triamcinolone 15 (g) 1 x Per Week/30 Days Discharge Instructions: Use triamcinolone 15 (g) as directed Peri-Wound Care: Sween Lotion (Moisturizing lotion) 1 x Per Week/30 Days Discharge Instructions: Apply moisturizing lotion as directed Prim Dressing: KerraCel Ag Gelling Fiber Dressing, 4x5 in (silver alginate) 1 x Per Week/30 Days ary Discharge Instructions: Apply silver alginate to wound bed as instructed Secondary Dressing: ABD Pad, 8x10 1 x Per Week/30 Days Discharge Instructions: Apply over primary dressing as directed. Compression Wrap: ThreePress (3 layer compression wrap) 1 x Per Week/30 Days Discharge Instructions: Apply three layer compression as directed. Wound #6 - Lower Leg Wound Laterality: Right, Posterior Cleanser: Soap and Water 1 x Per Week/30 Days Discharge Instructions: May shower and wash wound with dial antibacterial soap and water prior to dressing change. Cleanser: Wound Cleanser 1 x Per Week/30 Days Discharge Instructions: Cleanse the wound with wound cleanser prior to applying a clean dressing using gauze sponges, not tissue or cotton balls. Peri-Wound Care: Triamcinolone 15 (g) 1 x Per Week/30 Days Discharge Instructions: Use triamcinolone 15 (g) as directed Peri-Wound Care: Sween Lotion (Moisturizing lotion) 1 x Per Week/30 Days Discharge Instructions: Apply moisturizing lotion as directed Prim Dressing: KerraCel Ag Gelling Fiber Dressing, 4x5 in (silver alginate) 1 x Per Week/30 Days ary Discharge Instructions: Apply silver alginate to wound bed as instructed Secondary Dressing: ABD Pad,  8x10 1 x Per Week/30 Days Discharge Instructions: Apply over primary dressing as directed. Compression Wrap: ThreePress (3 layer compression wrap) 1  x Per Week/30 Days Discharge Instructions: Apply three layer compression as directed. Wound #7 - Lower Leg Wound Laterality: Left, Posterior Cleanser: Soap and Water 1 x Per Week/30 Days Discharge Instructions: May shower and wash wound with dial antibacterial soap and water prior to dressing change. Cleanser: Wound Cleanser 1 x Per Week/30 Days Discharge Instructions: Cleanse the wound with wound cleanser prior to applying a clean dressing using gauze sponges, not tissue or cotton balls. Peri-Wound Care: Triamcinolone 15 (g) 1 x Per Week/30 Days Discharge Instructions: Use triamcinolone 15 (g) as directed Peri-Wound Care: Sween Lotion (Moisturizing lotion) 1 x Per Week/30 Days Discharge Instructions: Apply moisturizing lotion as directed Prim Dressing: KerraCel Ag Gelling Fiber Dressing, 4x5 in (silver alginate) 1 x Per Week/30 Days ary Discharge Instructions: Apply silver alginate to wound bed as instructed Secondary Dressing: ABD Pad, 8x10 1 x Per Week/30 Days Discharge Instructions: Apply over primary dressing as directed. Compression Wrap: FourPress (4 layer compression wrap) 1 x Per Week/30 Days Discharge Instructions: Apply four layer compression as directed. May also use Miliken CoFlex 2 layer compression system as alternative. Electronic Signature(s) Signed: 11/23/2021 4:19:15 PM By: Rhae Hammock RN Signed: 11/23/2021 4:39:24 PM By: Linton Ham MD Entered By: Rhae Hammock on 11/23/2021 14:43:31 -------------------------------------------------------------------------------- Problem List Details Patient Name: Date of Service: Vanessa Palmer, Vanessa Chick D. 11/23/2021 2:30 PM Medical Record Number: [id number] Patient Account Number: [id number] Date of Birth/Sex: Treating RN: 09-17-1950 (71 y.o. F) Primary Care Provider: Maudry Mayhew Other Clinician: Referring Provider: Treating Provider/Extender: Duffy Rhody in Treatment: 2 Active Problems ICD-10 Encounter Code Description Active Date MDM Diagnosis I89.0 Lymphedema, not elsewhere classified 11/09/2021 No Yes L97.818 Non-pressure chronic ulcer of other part of right lower leg with other specified 11/09/2021 No Yes severity L97.828 Non-pressure chronic ulcer of other part of left lower leg with other specified 11/09/2021 No Yes severity I87.303 Chronic venous hypertension (idiopathic) without complications of bilateral 11/09/2021 No Yes lower extremity Inactive Problems Resolved Problems Electronic Signature(s) Signed: 11/23/2021 4:39:24 PM By: Linton Ham MD Entered By: Linton Ham on 11/23/2021 15:03:53 -------------------------------------------------------------------------------- Progress Note Details Patient Name: Date of Service: Vanessa Small D. 11/23/2021 2:30 PM Medical Record Number: [id number] Patient Account Number: [id number] Date of Birth/Sex: Treating RN: 09-Mar-1950 (71 y.o. F) Primary Care Provider: Maudry Mayhew Other Clinician: Referring Provider: Treating Provider/Extender: Duffy Rhody in Treatment: 2 Subjective History of Present Illness (HPI) Admission 09/07/2021 Ms. Rafelita Bissinger is a 71 year old female with a past medical history of essential hypertension, chronic kidney disease stage III, and lymphedema that presents to the clinic for a 2-week history of scattered open wounds to her lower extremities bilaterally. She states this has been an ongoing issue for the past year. She states that the wounds wax and wane in healing. She currently takes torsemide. She does not wear compression stockings. She currently denies signs of infection. 7/20; patient presents for follow-up. She received 1 juxta lite compression wrap in the mail. She was supposed to receive 2. She  states she talked to the company and they are sending her her second pair. We used antibiotic ointment and silver alginate under 3 layer compression at last clinic visit. The silver alginate stuck to the wound bed. The left lower extremity wounds have healed. 7/27; patient presents for follow-up. She still has not received her juxta lite compression for the right leg. We spoke with the wound care supply company today and they stated it will arrive today. We have been using Xeroform  under 3 layer compression to the right lower extremity. She has been using the juxta lite compression to the left lower extremity. Her wounds remain healed to the left lower extremity. READMISSION 11/09/2021 This is a patient that we had in clinic in July. She has lymphedema secondary to chronic venous insufficiency and had scattered open areas on her bilateral lower extremities she received 3 layer compression bilaterally the area is closed over and she was prescribed juxta lite stockings. She tells me she was compliant with the juxta lites, apparently she has home health aides to help her put these on. About 3 weeks ago she noted increase in swelling and weeping areas in her bilateral lower legs and she is in for review of this. She does not have arterial issues her ABIs in the clinic last time were quite normal bilaterally. She does have chronic kidney disease stage III and lumbar radiculopathy. 9/21; patient presents for follow-up. She presented last week with open wounds to her legs bilaterally. Silver alginate under 3 layer compression was used. Her left lower extremity wounds have healed. She has a juxta light compression at home. She still has open wounds on the right lower extremity. She denies signs of infection. She tolerated the compression wraps well. 9/28; as I understand things we actually healed out her left leg last week and transitioned her to juxta lites however almost immediately fluid ripped out  of several open areas on the left leg. She comes in today with significant bilateral lower extremity edema. Objective Constitutional Sitting or standing Blood Pressure is within target range for patient.. Pulse regular and within target range for patient.Marland Kitchen Respirations regular, non-labored and within target range.. Temperature is normal and within the target range for the patient.Marland Kitchen Appears in no distress. Vitals Time Taken: 2:23 PM, Temperature: 98.7 F, Pulse: 70 bpm, Respiratory Rate: 17 breaths/min, Blood Pressure: 119/69 mmHg. General Notes: Wound exam; oo On the left lower extremity the anterior wound was declared healed last week however she comes in this week with 3 open areas to posteriorly and 1 anteriorly weeping edema fluid. oo On the right leg large wound on the right posterior calf. oo All of this and poorly controlled lymphedema Integumentary (Hair, Skin) Wound #5 status is Open. Original cause of wound was Blister. The date acquired was: 10/23/2021. The wound has been in treatment 2 weeks. The wound is located on the Right,Lateral Lower Leg. The wound measures 0.1cm length x 0.1cm width x 0.1cm depth; 0.008cm^2 area and 0.001cm^3 volume. There is Fat Layer (Subcutaneous Tissue) exposed. There is no tunneling or undermining noted. There is a medium amount of serous drainage noted. The wound margin is distinct with the outline attached to the wound base. There is large (67-100%) red granulation within the wound bed. There is a small (1-33%) amount of necrotic tissue within the wound bed including Adherent Slough. Wound #6 status is Open. Original cause of wound was Blister. The date acquired was: 10/23/2021. The wound has been in treatment 2 weeks. The wound is located on the Right,Posterior Lower Leg. The wound measures 3.7cm length x 1.3cm width x 0.1cm depth; 3.778cm^2 area and 0.378cm^3 volume. There is Fat Layer (Subcutaneous Tissue) exposed. There is no tunneling or undermining  noted. There is a medium amount of serous drainage noted. The wound margin is distinct with the outline attached to the wound base. There is large (67-100%) red granulation within the wound bed. There is a small (1-33%) amount of necrotic tissue within the wound  bed including Adherent Slough. Wound #7 status is Open. Original cause of wound was Gradually Appeared. The date acquired was: 11/22/2021. The wound is located on the Left,Posterior Lower Leg. The wound measures 2.5cm length x 1.5cm width x 0.1cm depth; 2.945cm^2 area and 0.295cm^3 volume. There is Fat Layer (Subcutaneous Tissue) exposed. There is no tunneling or undermining noted. There is a medium amount of serosanguineous drainage noted. The wound margin is distinct with the outline attached to the wound base. There is large (67-100%) red, pink granulation within the wound bed. There is a small (1-33%) amount of necrotic tissue within the wound bed including Adherent Slough. Assessment Active Problems ICD-10 Lymphedema, not elsewhere classified Non-pressure chronic ulcer of other part of right lower leg with other specified severity Non-pressure chronic ulcer of other part of left lower leg with other specified severity Chronic venous hypertension (idiopathic) without complications of bilateral lower extremity Procedures Wound #5 Pre-procedure diagnosis of Wound #5 is a Lymphedema located on the Right,Lateral Lower Leg . There was a Four Layer Compression Therapy Procedure by Rhae Hammock, RN. Post procedure Diagnosis Wound #5: Same as Pre-Procedure Wound #6 Pre-procedure diagnosis of Wound #6 is a Lymphedema located on the Right,Posterior Lower Leg . There was a Four Layer Compression Therapy Procedure by Rhae Hammock, RN. Post procedure Diagnosis Wound #6: Same as Pre-Procedure Wound #7 Pre-procedure diagnosis of Wound #7 is a Lymphedema located on the Left,Posterior Lower Leg . There was a Four Layer Compression  Therapy Procedure by Rhae Hammock, RN. Post procedure Diagnosis Wound #7: Same as Pre-Procedure Plan Follow-up Appointments: Return Appointment in 1 week. - with Dr. Heber [place] and [facility] Dame # 7 Anesthetic: (In clinic) Topical Lidocaine 5% applied to wound bed (In clinic) Topical Lidocaine 4% applied to wound bed Bathing/ Shower/ Hygiene: May shower with protection but do not get wound dressing(s) wet. - Can use cast protector bag from [facility], [facility] or [employer] Edema Control - Lymphedema / SCD / Other: Elevate legs to the level of the heart or above for 30 minutes daily and/or when sitting, a frequency of: - throughout the day Avoid standing for long periods of time. Other Edema Control Orders/Instructions: - We will try and see about ordering lymphedema pumps after you've had at least 4 weeks of wearing compression wraps!! WOUND #5: - Lower Leg Wound Laterality: Right, Lateral Cleanser: Soap and Water 1 x Per Week/30 Days Discharge Instructions: May shower and wash wound with dial antibacterial soap and water prior to dressing change. Cleanser: Wound Cleanser 1 x Per Week/30 Days Discharge Instructions: Cleanse the wound with wound cleanser prior to applying a clean dressing using gauze sponges, not tissue or cotton balls. Peri-Wound Care: Triamcinolone 15 (g) 1 x Per Week/30 Days Discharge Instructions: Use triamcinolone 15 (g) as directed Peri-Wound Care: Sween Lotion (Moisturizing lotion) 1 x Per Week/30 Days Discharge Instructions: Apply moisturizing lotion as directed Prim Dressing: KerraCel Ag Gelling Fiber Dressing, 4x5 in (silver alginate) 1 x Per Week/30 Days ary Discharge Instructions: Apply silver alginate to wound bed as instructed Secondary Dressing: ABD Pad, 8x10 1 x Per Week/30 Days Discharge Instructions: Apply over primary dressing as directed. Com pression Wrap: ThreePress (3 layer compression wrap) 1 x Per Week/30 Days Discharge Instructions: Apply three layer  compression as directed. WOUND #6: - Lower Leg Wound Laterality: Right, Posterior Cleanser: Soap and Water 1 x Per Week/30 Days Discharge Instructions: May shower and wash wound with dial antibacterial soap and water prior to dressing change. Cleanser: Wound Cleanser 1  x Per Week/30 Days Discharge Instructions: Cleanse the wound with wound cleanser prior to applying a clean dressing using gauze sponges, not tissue or cotton balls. Peri-Wound Care: Triamcinolone 15 (g) 1 x Per Week/30 Days Discharge Instructions: Use triamcinolone 15 (g) as directed Peri-Wound Care: Sween Lotion (Moisturizing lotion) 1 x Per Week/30 Days Discharge Instructions: Apply moisturizing lotion as directed Prim Dressing: KerraCel Ag Gelling Fiber Dressing, 4x5 in (silver alginate) 1 x Per Week/30 Days ary Discharge Instructions: Apply silver alginate to wound bed as instructed Secondary Dressing: ABD Pad, 8x10 1 x Per Week/30 Days Discharge Instructions: Apply over primary dressing as directed. Com pression Wrap: ThreePress (3 layer compression wrap) 1 x Per Week/30 Days Discharge Instructions: Apply three layer compression as directed. WOUND #7: - Lower Leg Wound Laterality: Left, Posterior Cleanser: Soap and Water 1 x Per Week/30 Days Discharge Instructions: May shower and wash wound with dial antibacterial soap and water prior to dressing change. Cleanser: Wound Cleanser 1 x Per Week/30 Days Discharge Instructions: Cleanse the wound with wound cleanser prior to applying a clean dressing using gauze sponges, not tissue or cotton balls. Peri-Wound Care: Triamcinolone 15 (g) 1 x Per Week/30 Days Discharge Instructions: Use triamcinolone 15 (g) as directed Peri-Wound Care: Sween Lotion (Moisturizing lotion) 1 x Per Week/30 Days Discharge Instructions: Apply moisturizing lotion as directed Prim Dressing: KerraCel Ag Gelling Fiber Dressing, 4x5 in (silver alginate) 1 x Per Week/30 Days ary Discharge Instructions:  Apply silver alginate to wound bed as instructed Secondary Dressing: ABD Pad, 8x10 1 x Per Week/30 Days Discharge Instructions: Apply over primary dressing as directed. Com pression Wrap: FourPress (4 layer compression wrap) 1 x Per Week/30 Days Discharge Instructions: Apply four layer compression as directed. May also use Miliken CoFlex 2 layer compression system as alternative. 1. The wounds on the right posterior and right lateral leg are about the same as last week. We are using silver alginate ABDs. I have increased her from 3-4 layer compression 2. Reopening on the left side and 3 small areas to posteriorly 1 anteriorly we are going to use silver alginate and 4-layer compression here as well 3. I think this patient is going to need external compression pumps. She was here in July and again has failed compression stockings on the left in short order. Electronic Signature(s) Signed: 11/23/2021 4:39:24 PM By: Linton Ham MD Entered By: Linton Ham on 11/23/2021 15:13:46 -------------------------------------------------------------------------------- SuperBill Details Patient Name: Date of Service: Vanessa Palmer, Vanessa Chick D. 11/23/2021 Medical Record Number: [id number] Patient Account Number: [id number] Date of Birth/Sex: Treating RN: Aug 04, 1950 (71 y.o. [name] Phoenix, Lauren Primary Care Provider: Maudry Mayhew Other Clinician: Referring Provider: Treating Provider/Extender: Duffy Rhody in Treatment: 2 Diagnosis Coding ICD-10 Codes Code Description I89.0 Lymphedema, not elsewhere classified L97.818 Non-pressure chronic ulcer of other part of right lower leg with other specified severity L97.828 Non-pressure chronic ulcer of other part of left lower leg with other specified severity I87.303 Chronic venous hypertension (idiopathic) without complications of bilateral lower extremity Facility Procedures CPT4: Code [id number] Description: 81 BILATERAL:  Application of multi-layer venous compression system; leg (below knee), including ankle and t. Modifier: Quantity: 1 Physician Procedures : CPT4 Code Description Modifier [id number] - WC PHYS LEVEL 3 - EST PT ICD-10 Diagnosis Description L97.818 Non-pressure chronic ulcer of other part of right lower leg with other specified severity L97.828 Non-pressure chronic ulcer of other part of  left lower leg with other specified severity I89.0 Lymphedema, not elsewhere classified Quantity: 1  Electronic Signature(s) Signed: 11/23/2021 4:39:24 PM By: Linton Ham MD Entered By: Linton Ham on 11/23/2021 15:14:09

## 2021-11-24 ENCOUNTER — Ambulatory Visit: Payer: Medicare Other

## 2021-11-29 ENCOUNTER — Ambulatory Visit: Payer: Medicare Other

## 2021-11-30 ENCOUNTER — Encounter (HOSPITAL_BASED_OUTPATIENT_CLINIC_OR_DEPARTMENT_OTHER): Payer: Medicare Other | Attending: Internal Medicine | Admitting: Internal Medicine

## 2021-11-30 DIAGNOSIS — M109 Gout, unspecified: Secondary | ICD-10-CM | POA: Diagnosis not present

## 2021-11-30 DIAGNOSIS — N183 Chronic kidney disease, stage 3 unspecified: Secondary | ICD-10-CM | POA: Insufficient documentation

## 2021-11-30 DIAGNOSIS — I130 Hypertensive heart and chronic kidney disease with heart failure and stage 1 through stage 4 chronic kidney disease, or unspecified chronic kidney disease: Secondary | ICD-10-CM | POA: Insufficient documentation

## 2021-11-30 DIAGNOSIS — L97818 Non-pressure chronic ulcer of other part of right lower leg with other specified severity: Secondary | ICD-10-CM

## 2021-11-30 DIAGNOSIS — I509 Heart failure, unspecified: Secondary | ICD-10-CM | POA: Diagnosis not present

## 2021-11-30 DIAGNOSIS — M1990 Unspecified osteoarthritis, unspecified site: Secondary | ICD-10-CM | POA: Insufficient documentation

## 2021-11-30 DIAGNOSIS — D631 Anemia in chronic kidney disease: Secondary | ICD-10-CM | POA: Diagnosis not present

## 2021-11-30 DIAGNOSIS — I890 Lymphedema, not elsewhere classified: Secondary | ICD-10-CM | POA: Diagnosis not present

## 2021-11-30 DIAGNOSIS — I872 Venous insufficiency (chronic) (peripheral): Secondary | ICD-10-CM | POA: Diagnosis not present

## 2021-11-30 DIAGNOSIS — I87303 Chronic venous hypertension (idiopathic) without complications of bilateral lower extremity: Secondary | ICD-10-CM

## 2021-11-30 DIAGNOSIS — M5416 Radiculopathy, lumbar region: Secondary | ICD-10-CM | POA: Insufficient documentation

## 2021-11-30 DIAGNOSIS — L97828 Non-pressure chronic ulcer of other part of left lower leg with other specified severity: Secondary | ICD-10-CM

## 2021-11-30 DIAGNOSIS — I779 Disorder of arteries and arterioles, unspecified: Secondary | ICD-10-CM | POA: Insufficient documentation

## 2021-11-30 NOTE — Progress Notes (Signed)
SIERRA, SPARGO ([id number]) Visit Report for 11/30/2021 Arrival Information Details Patient Name: Date of Service: Vanessa Palmer, Vanessa Palmer 11/30/2021 10:15 A M Medical Record Number: [id number] Patient Account Number: [id number] Date of Birth/Sex: Treating RN: 1950-03-27 (71 y.o. Vanessa Palmer Primary Care Gjon Letarte: Maudry Mayhew Other Clinician: Referring Denali Becvar: Treating Manolo Bosket/Extender: Theressa Millard in Treatment: 3 Visit Information History Since Last Visit Added or deleted any medications: No Patient Arrived: Wheel Chair Any new allergies or adverse reactions: No Arrival Time: 10:18 Had a fall or experienced change in No Accompanied By: Daughter activities of daily living that may affect Transfer Assistance: Manual risk of falls: Patient Identification Verified: Yes Signs or symptoms of abuse/neglect since last visito No Secondary Verification Process Completed: Yes Hospitalized since last visit: No Patient Requires Transmission-Based Precautions: No Implantable device outside of the clinic excluding No Patient Has Alerts: Yes cellular tissue based products placed in the center Patient Alerts: Patient on Blood Thinner since last visit: ABI's= R 1.05 L1.16 Has Dressing in Place as Prescribed: Yes Has Compression in Place as Prescribed: Yes Pain Present Now: No Electronic Signature(s) Signed: 11/30/2021 4:56:32 PM By: Lorrin Jackson Entered By: Lorrin Jackson on 11/30/2021 10:22:30 -------------------------------------------------------------------------------- Compression Therapy Details Patient Name: Date of Service: Vanessa Small D. 11/30/2021 10:15 A M Medical Record Number: [id number] Patient Account Number: [id number] Date of Birth/Sex: Treating RN: [birth year] (71 y.o. Vanessa Palmer Primary Care Treyshaun Keatts: Maudry Mayhew Other Clinician: Referring Zakhia Seres: Treating Laquita Harlan/Extender: Theressa Millard  in Treatment: 3 Compression Therapy Performed for Wound Assessment: Wound #6 Right,Posterior Lower Leg Performed By: Clinician Lorrin Jackson, RN Compression Type: Three Layer Post Procedure Diagnosis Same as Pre-procedure Electronic Signature(s) Signed: 11/30/2021 4:56:32 PM By: Lorrin Jackson Entered By: Lorrin Jackson on 11/30/2021 10:52:55 -------------------------------------------------------------------------------- Encounter Discharge Information Details Patient Name: Date of Service: Vanessa Small D. 11/30/2021 10:15 A M Medical Record Number: [id number] Patient Account Number: [id number] Date of Birth/Sex: Treating RN: Oct 19, 1950 (71 y.o. Vanessa Palmer Primary Care Raiza Kiesel: Maudry Mayhew Other Clinician: Referring Kemet Nijjar: Treating Latesha Chesney/Extender: Theressa Millard in Treatment: 3 Encounter Discharge Information Items Discharge Condition: Stable Ambulatory Status: Wheelchair Discharge Destination: Home Transportation: Private Auto Accompanied By: daughter Schedule Follow-up Appointment: Yes Clinical Summary of Care: Provided on 11/30/2021 Form Type Recipient Paper Patient Patient Electronic Signature(s) Signed: 11/30/2021 4:56:32 PM By: Lorrin Jackson Entered By: Lorrin Jackson on 11/30/2021 11:05:38 -------------------------------------------------------------------------------- Lower Extremity Assessment Details Patient Name: Date of Service: Vanessa Small D. 11/30/2021 10:15 A M Medical Record Number: [id number] Patient Account Number: [id number] Date of Birth/Sex: Treating RN: 1950/12/22 (71 y.o. Vanessa Palmer Primary Care Aftin Lye: Maudry Mayhew Other Clinician: Referring Shericka Johnstone: Treating Clista Rainford/Extender: Theressa Millard in Treatment: 3 Edema Assessment Assessed: [name]: Yes] [name]: Yes] Edema: [Left: Yes] [Right: Yes] Calf Left: Right: Point of Measurement: 28 cm From Medial  Instep 48.5 cm 49 cm Ankle Left: Right: Point of Measurement: 9 cm From Medial Instep 27 cm 24 cm Vascular Assessment Pulses: Dorsalis Pedis Palpable: [Left:Yes] [Right:Yes] Electronic Signature(s) Signed: 11/30/2021 4:56:32 PM By: Lorrin Jackson Entered By: Lorrin Jackson on 11/30/2021 10:31:36 -------------------------------------------------------------------------------- Multi Wound Chart Details Patient Name: Date of Service: Vanessa Small D. 11/30/2021 10:15 A M Medical Record Number: [id number] Patient Account Number: [id number] Date of Birth/Sex: Treating RN: [birth year] (71 y.o. Vanessa Palmer Primary Care Santino Kinsella: Maudry Mayhew Other Clinician: Referring Hortencia Martire: Treating Faten Frieson/Extender: Theressa Millard in Treatment: 3 Vital Signs Height(in): Pulse(bpm): 66 Weight(lbs): Blood  Pressure(mmHg): 111/70 Body Mass Index(BMI): Temperature(F): 97.9 Respiratory Rate(breaths/min): 20 Photos: Right, Lateral Lower Leg Right, Posterior Lower Leg Left, Posterior Lower Leg Wound Location: Blister Blister Gradually Appeared Wounding Event: Lymphedema Lymphedema Lymphedema Primary Etiology: Anemia, Asthma, Sleep Apnea, Anemia, Asthma, Sleep Apnea, Anemia, Asthma, Sleep Apnea, Comorbid History: Congestive Heart Failure, Coronary Congestive Heart Failure, Coronary Congestive Heart Failure, Coronary Artery Disease, Deep Vein Artery Disease, Deep Vein Artery Disease, Deep Vein Thrombosis, Hypertension, Myocardial Thrombosis, Hypertension, Myocardial Thrombosis, Hypertension, Myocardial Infarction, Gout, Osteoarthritis, SeizureInfarction, Gout, Osteoarthritis, SeizureInfarction, Gout, Osteoarthritis, Seizure Disorder Disorder Disorder 10/23/2021 10/23/2021 11/22/2021 Date Acquired: 3 3 1  Weeks of Treatment: Healed - Epithelialized Open Healed - Epithelialized Wound Status: No No No Wound Recurrence: No No Yes Clustered Wound: 0x0x0  0.4x0.4x0.1 0x0x0 Measurements L x W x D (cm) 0 0.126 0 A (cm) : rea 0 0.013 0 Volume (cm) : 100.00% 19.70% 100.00% % Reduction in Area: 100.00% 18.80% 100.00% % Reduction in Volume: Full Thickness Without Exposed Full Thickness Without Exposed Full Thickness With Exposed Support Classification: Support Structures Support Structures Structures N/A Medium N/A Exudate A mount: N/A Serous N/A Exudate Type: N/A amber N/A Exudate Color: N/A Distinct, outline attached N/A Wound Margin: N/A Large (67-100%) N/A Granulation A mount: N/A Red, Pink N/A Granulation Quality: N/A None Present (0%) N/A Necrotic A mount: N/A Small (1-33%) N/A Epithelialization: Excoriation: No Periwound Skin Texture: Induration: No Callus: No Crepitus: No Rash: No Scarring: No Maceration: No Periwound Skin Moisture: Dry/Scaly: No Hemosiderin Staining: Yes Periwound Skin Color: Atrophie Blanche: No Cyanosis: No Ecchymosis: No Erythema: No Mottled: No Pallor: No Rubor: No N/A No Abnormality N/A Temperature: N/A Compression Therapy N/A Procedures Performed: Treatment Notes Electronic Signature(s) Signed: 11/30/2021 4:20:55 PM By: Geralyn CorwinHoffman, Jessica DO Signed: 11/30/2021 4:56:32 PM By: Antonieta IbaBarnhart, Jodi Entered By: Geralyn CorwinHoffman, Jessica on 11/30/2021 10:55:09 -------------------------------------------------------------------------------- Multi-Disciplinary Care Plan Details Patient Name: Date of Service: Sherri RadSIMMO NS, Norina D. 11/30/2021 10:15 A M Medical Record Number: [id number] Patient Account Number: [id number] Date of Birth/Sex: Treating RN: August 04, 1950 [age] y.o. Roel CluckF) Barnhart, Jodi Primary Care Jhanvi Drakeford: Harriet MassonMaldonado, Edgar Other Clinician: Referring Talyia Allende: Treating Pina Sirianni/Extender: Irineo AxonHoffman, Jessica Maldonado, Edgar Weeks in Treatment: 3 Active Inactive Venous Leg Ulcer Nursing Diagnoses: Actual venous Insuffiency (use after diagnosis is confirmed) Goals: Patient will maintain optimal  edema control Date Initiated: 11/09/2021 Target Resolution Date: 12/07/2021 Goal Status: Active Interventions: Assess peripheral edema status every visit. Compression as ordered Provide education on venous insufficiency Treatment Activities: Therapeutic compression applied : 11/09/2021 Notes: Wound/Skin Impairment Nursing Diagnoses: Impaired tissue integrity Goals: Patient/caregiver will verbalize understanding of skin care regimen Date Initiated: 11/09/2021 Target Resolution Date: 12/07/2021 Goal Status: Active Ulcer/skin breakdown will have a volume reduction of 30% by week 4 Date Initiated: 11/09/2021 Target Resolution Date: 12/06/2021 Goal Status: Active Interventions: Assess patient/caregiver ability to obtain necessary supplies Assess patient/caregiver ability to perform ulcer/skin care regimen upon admission and as needed Assess ulceration(s) every visit Provide education on ulcer and skin care Treatment Activities: Topical wound management initiated : 11/09/2021 Notes: Electronic Signature(s) Signed: 11/30/2021 4:56:32 PM By: Antonieta IbaBarnhart, Jodi Entered By: Antonieta IbaBarnhart, Jodi on 11/30/2021 10:18:37 -------------------------------------------------------------------------------- Pain Assessment Details Patient Name: Date of Service: Sherri RadSIMMO NS, Meleni D. 11/30/2021 10:15 A M Medical Record Number: [id number] Patient Account Number: [id number] Date of Birth/Sex: Treating RN: August 04, 1950 [age] y.o. Roel CluckF) Barnhart, Jodi Primary Care Caelyn Route: Harriet MassonMaldonado, Edgar Other Clinician: Referring Chanceler Pullin: Treating Deoni Cosey/Extender: Irineo AxonHoffman, Jessica Maldonado, Edgar Weeks in Treatment: 3 Active Problems Location of Pain Severity and Description of Pain Patient Has Paino No Site Locations  Pain Management and Medication Current Pain Management: Electronic Signature(s) Signed: 11/30/2021 4:56:32 PM By: Lorrin Jackson Entered By: Lorrin Jackson on 11/30/2021  10:25:21 -------------------------------------------------------------------------------- Patient/Caregiver Education Details Patient Name: Date of Service: Vanessa Small D. 10/5/2023andnbsp10:15 A M Medical Record Number: [id number] Patient Account Number: [id number] Date of Birth/Gender: Treating RN: 04/14/1950 (71 y.o. Vanessa Palmer Primary Care Physician: Maudry Mayhew Other Clinician: Referring Physician: Treating Physician/Extender: Theressa Millard in Treatment: 3 Education Assessment Education Provided To: Patient Education Topics Provided Venous: Methods: Explain/Verbal, Printed Responses: State content correctly Wound/Skin Impairment: Methods: Explain/Verbal, Printed Responses: State content correctly Electronic Signature(s) Signed: 11/30/2021 4:56:32 PM By: Lorrin Jackson Entered By: Lorrin Jackson on 11/30/2021 10:18:57 -------------------------------------------------------------------------------- Wound Assessment Details Patient Name: Date of Service: Vanessa Small D. 11/30/2021 10:15 A M Medical Record Number: [id number] Patient Account Number: [id number] Date of Birth/Sex: Treating RN: 1950/09/27 (71 y.o. Vanessa Palmer Primary Care Jamarion [place]um[place]: Maudry Mayhew Other Clinician: Referring Naya Ilagan: Treating Nariah Morgano/Extender: Theressa Millard in Treatment: 3 Wound Status Wound Number: 5 Primary Lymphedema Etiology: Wound Location: Right, Lateral Lower Leg Wound Healed - Epithelialized Wounding Event: Blister Status: Date Acquired: 10/23/2021 Comorbid Anemia, Asthma, Sleep Apnea, Congestive Heart Failure, Coronary Weeks Of Treatment: 3 History: Artery Disease, Deep Vein Thrombosis, Hypertension, Myocardial Clustered Wound: No Infarction, Gout, Osteoarthritis, Seizure Disorder Photos Wound Measurements Length: (cm) Width: (cm) Depth: (cm) Area: (cm) Volume: (cm) 0 % Reduction in Area:  100% 0 % Reduction in Volume: 100% 0 0 0 Wound Description Classification: Full Thickness Without Exposed Support Structur es Periwound Skin Texture Texture Color No Abnormalities Noted: No No Abnormalities Noted: No Moisture No Abnormalities Noted: No Electronic Signature(s) Signed: 11/30/2021 4:56:32 PM By: Lorrin Jackson Entered By: Lorrin Jackson on 11/30/2021 10:50:40 -------------------------------------------------------------------------------- Wound Assessment Details Patient Name: Date of Service: Vanessa Small D. 11/30/2021 10:15 A M Medical Record Number: [id number] Patient Account Number: [id number] Date of Birth/Sex: Treating RN: 1951/01/25 (71 y.o. Vanessa Palmer Primary Care Jacaria Colburn: Maudry Mayhew Other Clinician: Referring Thamas Appleyard: Treating Dosha Broshears/Extender: Theressa Millard in Treatment: 3 Wound Status Wound Number: 6 Primary Lymphedema Etiology: Wound Location: Right, Posterior Lower Leg Wound Open Wounding Event: Blister Status: Date Acquired: 10/23/2021 Comorbid Anemia, Asthma, Sleep Apnea, Congestive Heart Failure, Coronary Weeks Of Treatment: 3 History: Artery Disease, Deep Vein Thrombosis, Hypertension, Myocardial Clustered Wound: No Infarction, Gout, Osteoarthritis, Seizure Disorder Photos Wound Measurements Length: (cm) 0.4 Width: (cm) 0.4 Depth: (cm) 0.1 Area: (cm) 0.126 Volume: (cm) 0.013 % Reduction in Area: 19.7% % Reduction in Volume: 18.8% Epithelialization: Small (1-33%) Tunneling: No Undermining: No Wound Description Classification: Full Thickness Without Exposed Support Structu Wound Margin: Distinct, outline attached Exudate Amount: Medium Exudate Type: Serous Exudate Color: amber res Foul Odor After Cleansing: No Slough/Fibrino No Wound Bed Granulation Amount: Large (67-100%) Exposed Structure Granulation Quality: Red, Pink Fascia Exposed: No Necrotic Amount: None Present (0%) Fat  Layer (Subcutaneous Tissue) Exposed: Yes Tendon Exposed: No Muscle Exposed: No Joint Exposed: No Bone Exposed: No Periwound Skin Texture Texture Color No Abnormalities Noted: Yes No Abnormalities Noted: No Atrophie Blanche: No Moisture Cyanosis: No No Abnormalities Noted: Yes Ecchymosis: No Erythema: No Hemosiderin Staining: Yes Mottled: No Pallor: No Rubor: No Temperature / Pain Temperature: No Abnormality Treatment Notes Wound #6 (Lower Leg) Wound Laterality: Right, Posterior Cleanser Soap and Water Discharge Instruction: May shower and wash wound with dial antibacterial soap and water prior to dressing change. Wound Cleanser Discharge Instruction: Cleanse the wound with wound cleanser prior to applying  a clean dressing using gauze sponges, not tissue or cotton balls. Peri-Wound Care Triamcinolone 15 (g) Discharge Instruction: Use triamcinolone 15 (g) as directed Sween Lotion (Moisturizing lotion) Discharge Instruction: Apply moisturizing lotion as directed Topical Primary Dressing KerraCel Ag Gelling Fiber Dressing, 4x5 in (silver alginate) Discharge Instruction: Apply silver alginate to wound bed as instructed Secondary Dressing Woven Gauze Sponge, Non-Sterile 4x4 in Discharge Instruction: Apply over primary dressing as directed. Secured With Compression Wrap ThreePress (3 layer compression wrap) Discharge Instruction: Apply three layer compression as directed. Compression Stockings Add-Ons Electronic Signature(s) Signed: 11/30/2021 4:56:32 PM By: Lorrin Jackson Entered By: Lorrin Jackson on 11/30/2021 10:43:23 -------------------------------------------------------------------------------- Wound Assessment Details Patient Name: Date of Service: Vanessa Small D. 11/30/2021 10:15 A M Medical Record Number: [id number] Patient Account Number: [id number] Date of Birth/Sex: Treating RN: 1950/05/26 (71 y.o. Vanessa Palmer Primary Care Jezreel Justiniano: Maudry Mayhew Other Clinician: Referring Chequita Mofield: Treating Keya Wynes/Extender: Theressa Millard in Treatment: 3 Wound Status Wound Number: 7 Primary Lymphedema Etiology: Wound Location: Left, Posterior Lower Leg Wound Healed - Epithelialized Wounding Event: Gradually Appeared Status: Date Acquired: 11/22/2021 Comorbid Anemia, Asthma, Sleep Apnea, Congestive Heart Failure, Coronary Weeks Of Treatment: 1 History: Artery Disease, Deep Vein Thrombosis, Hypertension, Myocardial Clustered Wound: Yes Infarction, Gout, Osteoarthritis, Seizure Disorder Photos Wound Measurements Length: (cm) Width: (cm) Depth: (cm) Area: (cm) Volume: (cm) 0 % Reduction in Area: 100% 0 % Reduction in Volume: 100% 0 0 0 Wound Description Classification: Full Thickness With Exposed Support Structures Periwound Skin Texture Texture Color No Abnormalities Noted: No No Abnormalities Noted: No Moisture No Abnormalities Noted: No Electronic Signature(s) Signed: 11/30/2021 4:56:32 PM By: Lorrin Jackson Entered By: Lorrin Jackson on 11/30/2021 10:50:25 -------------------------------------------------------------------------------- Vitals Details Patient Name: Date of Service: Betsey Holiday, Lavonne Chick D. 11/30/2021 10:15 A M Medical Record Number: [id number] Patient Account Number: [id number] Date of Birth/Sex: Treating RN: Jun 28, 1950 (71 y.o. Vanessa Palmer Primary Care Sherese Heyward: Maudry Mayhew Other Clinician: Referring Kahleah Crass: Treating Tene Gato/Extender: Theressa Millard in Treatment: 3 Vital Signs Time Taken: 10:22 Temperature (F): 97.9 Pulse (bpm): 66 Respiratory Rate (breaths/min): 20 Blood Pressure (mmHg): 111/70 Reference Range: 80 - 120 mg / dl Electronic Signature(s) Signed: 11/30/2021 4:56:32 PM By: Lorrin Jackson Entered By: Lorrin Jackson on 11/30/2021 10:25:16

## 2021-11-30 NOTE — Progress Notes (Signed)
Vanessa, Palmer ([id number]) Visit Report for 11/30/2021 Chief Complaint Document Details Patient Name: Date of Service: Vanessa Palmer, Vanessa Palmer 11/30/2021 10:15 A M Medical Record Number: [id number] Patient Account Number: [id number] Date of Birth/Sex: Treating RN: 17-Apr-1950 (71 y.o. Vanessa Palmer Primary Care Provider: Maudry Mayhew Other Clinician: Referring Provider: Treating Provider/Extender: Theressa Millard in Treatment: 3 Information Obtained from: Patient Chief Complaint 09/07/2021; scattered open wounds to the lower extremities bilaterally 11/09/21; Patient returns to clinic with recurrent Palmer weeping areas on her bilateral lower legs Electronic Signature(s) Signed: 11/30/2021 4:20:55 PM By: Kalman Shan DO Entered By: Kalman Shan on 11/30/2021 10:55:15 -------------------------------------------------------------------------------- HPI Details Patient Name: Date of Service: Vanessa Palmer D. 11/30/2021 10:15 A M Medical Record Number: [id number] Patient Account Number: [id number] Date of Birth/Sex: Treating RN: 1951/01/06 (71 y.o. Vanessa Palmer Primary Care Provider: Maudry Mayhew Other Clinician: Referring Provider: Treating Provider/Extender: Theressa Millard in Treatment: 3 History of Present Illness HPI Description: Admission 09/07/2021 Ms. [name] is a 71 year old female with a past medical history of essential hypertension, chronic kidney disease stage III, and lymphedema that presents to the clinic for a 2-week history of scattered open wounds to her lower extremities bilaterally. She states this has been an ongoing issue for the past year. She states that the wounds wax and wane in healing. She currently takes torsemide. She does not wear compression stockings. She currently denies signs of infection. 7/20; patient presents for follow-up. She received 1 juxta lite compression wrap in the mail.  She was supposed to receive 2. She states she talked to the company and they are sending her her second pair. We used antibiotic ointment and silver alginate under 3 layer compression at last clinic visit. The silver alginate stuck to the wound bed. The left lower extremity wounds have healed. 7/27; patient presents for follow-up. She still has not received her juxta lite compression for the right leg. We spoke with the wound care supply company today and they stated it will arrive today. We have been using Xeroform under 3 layer compression to the right lower extremity. She has been using the juxta lite compression to the left lower extremity. Her wounds remain healed to the left lower extremity. READMISSION 11/09/2021 This is a patient that we had in clinic in July. She has lymphedema secondary to chronic venous insufficiency and had scattered open areas on her bilateral lower extremities she received 3 layer compression bilaterally the area is closed over and she was prescribed juxta lite stockings. She tells me she was compliant with the juxta lites, apparently she has home health aides to help her put these on. About 3 weeks ago she noted increase in swelling and weeping areas in her bilateral lower legs and she is in for review of this. She does not have arterial issues her ABIs in the clinic last time were quite normal bilaterally. She does have chronic kidney disease stage III and lumbar radiculopathy. 9/21; patient presents for follow-up. She presented last week with open wounds to her legs bilaterally. Silver alginate under 3 layer compression was used. Her left lower extremity wounds have healed. She has a juxta light compression at home. She still has open wounds on the right lower extremity. She denies signs of infection. She tolerated the compression wraps well. 9/28; as I understand things we actually healed out her left leg last week and transitioned her to juxta lites however almost  immediately fluid ripped out of several  open areas on the left leg. She comes in today with significant bilateral lower extremity edema. 10/5; patient presents for follow-up. Her left leg has healed again. She states she has her juxta lite compression wraps at home. We have been using silver alginate with 3 layer compression to the right lower extremity. She has no issues or complaints today. Electronic Signature(s) Signed: 11/30/2021 4:20:55 PM By: Kalman Shan DO Entered By: Kalman Shan on 11/30/2021 10:56:12 -------------------------------------------------------------------------------- Physical Exam Details Patient Name: Date of Service: Vanessa Palmer D. 11/30/2021 10:15 A M Medical Record Number: [id number] Patient Account Number: [id number] Date of Birth/Sex: Treating RN: May 17, 1950 (71 y.o. Vanessa Palmer Primary Care Provider: Maudry Mayhew Other Clinician: Referring Provider: Treating Provider/Extender: Theressa Millard in Treatment: 3 Constitutional respirations regular, non-labored and within target range for patient.. Cardiovascular 2+ dorsalis pedis/posterior tibialis pulses. Psychiatric pleasant and cooperative. Notes Left lower extremity: Epithelization to the previous wound sites. Right lower extremity: Palmer open wound to the right calf. Electronic Signature(s) Signed: 11/30/2021 4:20:55 PM By: Kalman Shan DO Entered By: Kalman Shan on 11/30/2021 10:58:20 -------------------------------------------------------------------------------- Physician Orders Details Patient Name: Date of Service: Vanessa Palmer D. 11/30/2021 10:15 A M Medical Record Number: [id number] Patient Account Number: [id number] Date of Birth/Sex: Treating RN: April 24, 1950 (71 y.o. Vanessa Palmer Primary Care Provider: Maudry Mayhew Other Clinician: Referring Provider: Treating Provider/Extender: Theressa Millard in  Treatment: 3 Verbal / Phone Orders: No Diagnosis Coding Follow-up Appointments ppointment in 1 week. - with Dr. Heber [place] and Sanjuan Dame # 7 Return A Anesthetic (In clinic) Topical Lidocaine 5% applied to wound bed (In clinic) Topical Lidocaine 4% applied to wound bed Bathing/ Shower/ Hygiene May shower with protection but do not get wound dressing(s) wet. - Can use cast protector bag from [facility], [facility] or [employer] Edema Control - Lymphedema / SCD / Other Elevate legs to the level of the heart or above for 30 minutes daily and/or when sitting, a frequency of: - throughout the day Avoid standing for long periods of time. Other Edema Control Orders/Instructions: - We will try and see about ordering lymphedema pumps after you've had at least 4 weeks of wearing compression wraps. Wound Treatment Wound #6 - Lower Leg Wound Laterality: Right, Posterior Cleanser: Soap and Water 1 x Per Week/30 Days Discharge Instructions: May shower and wash wound with dial antibacterial soap and water prior to dressing change. Cleanser: Wound Cleanser 1 x Per Week/30 Days Discharge Instructions: Cleanse the wound with wound cleanser prior to applying a clean dressing using gauze sponges, not tissue or cotton balls. Peri-Wound Care: Triamcinolone 15 (g) 1 x Per Week/30 Days Discharge Instructions: Use triamcinolone 15 (g) as directed Peri-Wound Care: Sween Lotion (Moisturizing lotion) 1 x Per Week/30 Days Discharge Instructions: Apply moisturizing lotion as directed Prim Dressing: KerraCel Ag Gelling Fiber Dressing, 4x5 in (silver alginate) 1 x Per Week/30 Days ary Discharge Instructions: Apply silver alginate to wound bed as instructed Secondary Dressing: Woven Gauze Sponge, Non-Sterile 4x4 in 1 x Per Week/30 Days Discharge Instructions: Apply over primary dressing as directed. Compression Wrap: ThreePress (3 layer compression wrap) 1 x Per Week/30 Days Discharge Instructions: Apply three layer compression as  directed. Electronic Signature(s) Signed: 11/30/2021 4:20:55 PM By: Kalman Shan DO Entered By: Kalman Shan on 11/30/2021 10:58:28 -------------------------------------------------------------------------------- Problem List Details Patient Name: Date of Service: Vanessa Palmer D. 11/30/2021 10:15 A M Medical Record Number: [id number] Patient Account Number: [id number] Date of Birth/Sex: Treating RN: [birth year] (71 y.o. F)  Lorrin Jackson Primary Care Provider: Maudry Mayhew Other Clinician: Referring Provider: Treating Provider/Extender: Theressa Millard in Treatment: 3 Active Problems ICD-10 Encounter Code Description Active Date MDM Diagnosis I89.0 Lymphedema, not elsewhere classified 11/09/2021 No Yes L97.818 Non-pressure chronic ulcer of other part of right lower leg with other specified 11/09/2021 No Yes severity L97.828 Non-pressure chronic ulcer of other part of left lower leg with other specified 11/09/2021 No Yes severity I87.303 Chronic venous hypertension (idiopathic) without complications of bilateral 11/09/2021 No Yes lower extremity Inactive Problems Resolved Problems Electronic Signature(s) Signed: 11/30/2021 4:20:55 PM By: Kalman Shan DO Entered By: Kalman Shan on 11/30/2021 10:55:05 -------------------------------------------------------------------------------- Progress Note Details Patient Name: Date of Service: Vanessa Palmer D. 11/30/2021 10:15 A M Medical Record Number: [id number] Patient Account Number: [id number] Date of Birth/Sex: Treating RN: Nov 25, 1950 (71 y.o. Vanessa Palmer Primary Care Provider: Maudry Mayhew Other Clinician: Referring Provider: Treating Provider/Extender: Theressa Millard in Treatment: 3 Subjective Chief Complaint Information obtained from Patient 09/07/2021; scattered open wounds to the lower extremities bilaterally 11/09/21; Patient returns to clinic  with recurrent Palmer weeping areas on her bilateral lower legs History of Present Illness (HPI) Admission 09/07/2021 Ms. Tamico Yingling is a 71 year old female with a past medical history of essential hypertension, chronic kidney disease stage III, and lymphedema that presents to the clinic for a 2-week history of scattered open wounds to her lower extremities bilaterally. She states this has been an ongoing issue for the past year. She states that the wounds wax and wane in healing. She currently takes torsemide. She does not wear compression stockings. She currently denies signs of infection. 7/20; patient presents for follow-up. She received 1 juxta lite compression wrap in the mail. She was supposed to receive 2. She states she talked to the company and they are sending her her second pair. We used antibiotic ointment and silver alginate under 3 layer compression at last clinic visit. The silver alginate stuck to the wound bed. The left lower extremity wounds have healed. 7/27; patient presents for follow-up. She still has not received her juxta lite compression for the right leg. We spoke with the wound care supply company today and they stated it will arrive today. We have been using Xeroform under 3 layer compression to the right lower extremity. She has been using the juxta lite compression to the left lower extremity. Her wounds remain healed to the left lower extremity. READMISSION 11/09/2021 This is a patient that we had in clinic in July. She has lymphedema secondary to chronic venous insufficiency and had scattered open areas on her bilateral lower extremities she received 3 layer compression bilaterally the area is closed over and she was prescribed juxta lite stockings. She tells me she was compliant with the juxta lites, apparently she has home health aides to help her put these on. About 3 weeks ago she noted increase in swelling and weeping areas in her bilateral lower legs and she  is in for review of this. She does not have arterial issues her ABIs in the clinic last time were quite normal bilaterally. She does have chronic kidney disease stage III and lumbar radiculopathy. 9/21; patient presents for follow-up. She presented last week with open wounds to her legs bilaterally. Silver alginate under 3 layer compression was used. Her left lower extremity wounds have healed. She has a juxta light compression at home. She still has open wounds on the right lower extremity. She denies signs of infection. She tolerated the  compression wraps well. 9/28; as I understand things we actually healed out her left leg last week and transitioned her to juxta lites however almost immediately fluid ripped out of several open areas on the left leg. She comes in today with significant bilateral lower extremity edema. 10/5; patient presents for follow-up. Her left leg has healed again. She states she has her juxta lite compression wraps at home. We have been using silver alginate with 3 layer compression to the right lower extremity. She has no issues or complaints today. Patient History Information obtained from Chart. Family History Diabetes - Siblings, Heart Disease - Father,Siblings. Social History Never smoker, Alcohol Use - Rarely - sober since 2006. Medical History Hematologic/Lymphatic Patient has history of Anemia Respiratory Patient has history of Asthma, Sleep Apnea - 2018- oCPAP Cardiovascular Patient has history of Congestive Heart Failure, Coronary Artery Disease, Deep Vein Thrombosis - 2014 both legs, Hypertension, Myocardial Infarction - 2005 Musculoskeletal Patient has history of Gout, Osteoarthritis Neurologic Patient has history of Seizure Disorder - as a teenager Hospitalization/Surgery History - heart cath 2018, 2003, 2004, 2006. - right total hip replacement 2016. - hysterectomy. Medical A Surgical History Notes nd Constitutional Symptoms (General  Health) chronic back pain Migraine last 2018 Cardiovascular hyperlipidemia Gastrointestinal GERD Genitourinary stage III CKD Musculoskeletal DJD Psychiatric depression Objective Constitutional respirations regular, non-labored and within target range for patient.. Vitals Time Taken: 10:22 AM, Temperature: 97.9 F, Pulse: 66 bpm, Respiratory Rate: 20 breaths/min, Blood Pressure: 111/70 mmHg. Cardiovascular 2+ dorsalis pedis/posterior tibialis pulses. Psychiatric pleasant and cooperative. General Notes: Left lower extremity: Epithelization to the previous wound sites. Right lower extremity: Palmer open wound to the right calf. Integumentary (Hair, Skin) Wound #5 status is Healed - Epithelialized. Original cause of wound was Blister. The date acquired was: 10/23/2021. The wound has been in treatment 3 weeks. The wound is located on the Right,Lateral Lower Leg. The wound measures 0cm length x 0cm width x 0cm depth; 0cm^2 area and 0cm^3 volume. Wound #6 status is Open. Original cause of wound was Blister. The date acquired was: 10/23/2021. The wound has been in treatment 3 weeks. The wound is located on the Right,Posterior Lower Leg. The wound measures 0.4cm length x 0.4cm width x 0.1cm depth; 0.126cm^2 area and 0.013cm^3 volume. There is Fat Layer (Subcutaneous Tissue) exposed. There is no tunneling or undermining noted. There is a medium amount of serous drainage noted. The wound margin is distinct with the outline attached to the wound base. There is large (67-100%) red, pink granulation within the wound bed. There is no necrotic tissue within the wound bed. The periwound skin appearance had no abnormalities noted for texture. The periwound skin appearance had no abnormalities noted for moisture. The periwound skin appearance exhibited: Hemosiderin Staining. The periwound skin appearance did not exhibit: Atrophie Blanche, Cyanosis, Ecchymosis, Mottled, Pallor, Rubor, Erythema. Periwound  temperature was noted as No Abnormality. Wound #7 status is Healed - Epithelialized. Original cause of wound was Gradually Appeared. The date acquired was: 11/22/2021. The wound has been in treatment 1 weeks. The wound is located on the Left,Posterior Lower Leg. The wound measures 0cm length x 0cm width x 0cm depth; 0cm^2 area and 0cm^3 volume. Assessment Active Problems ICD-10 Lymphedema, not elsewhere classified Non-pressure chronic ulcer of other part of right lower leg with other specified severity Non-pressure chronic ulcer of other part of left lower leg with other specified severity Chronic venous hypertension (idiopathic) without complications of bilateral lower extremity Patient's wounds appear well-healing. Her left lower extremity  wounds have healed. I recommended she use her Velcro compression wraps daily. We will give her Tubigrip in office today since she does not have her juxta lites with her. I also recommended elevating legs. I recommended continuing the course with silver alginate under 3 layer compression to the right lower extremity. Follow-up in 1 week. Procedures Wound #6 Pre-procedure diagnosis of Wound #6 is a Lymphedema located on the Right,Posterior Lower Leg . There was a Three Layer Compression Therapy Procedure by Lorrin Jackson, RN. Post procedure Diagnosis Wound #6: Same as Pre-Procedure Plan Follow-up Appointments: Return Appointment in 1 week. - with Dr. Heber Nemacolin and Sanjuan Dame # 7 Anesthetic: (In clinic) Topical Lidocaine 5% applied to wound bed (In clinic) Topical Lidocaine 4% applied to wound bed Bathing/ Shower/ Hygiene: May shower with protection but do not get wound dressing(s) wet. - Can use cast protector bag from [facility], [facility] or [employer] Edema Control - Lymphedema / SCD / Other: Elevate legs to the level of the heart or above for 30 minutes daily and/or when sitting, a frequency of: - throughout the day Avoid standing for long periods of time. Other  Edema Control Orders/Instructions: - We will try and see about ordering lymphedema pumps after you've had at least 4 weeks of wearing compression wraps. WOUND #6: - Lower Leg Wound Laterality: Right, Posterior Cleanser: Soap and Water 1 x Per Week/30 Days Discharge Instructions: May shower and wash wound with dial antibacterial soap and water prior to dressing change. Cleanser: Wound Cleanser 1 x Per Week/30 Days Discharge Instructions: Cleanse the wound with wound cleanser prior to applying a clean dressing using gauze sponges, not tissue or cotton balls. Peri-Wound Care: Triamcinolone 15 (g) 1 x Per Week/30 Days Discharge Instructions: Use triamcinolone 15 (g) as directed Peri-Wound Care: Sween Lotion (Moisturizing lotion) 1 x Per Week/30 Days Discharge Instructions: Apply moisturizing lotion as directed Prim Dressing: KerraCel Ag Gelling Fiber Dressing, 4x5 in (silver alginate) 1 x Per Week/30 Days ary Discharge Instructions: Apply silver alginate to wound bed as instructed Secondary Dressing: Woven Gauze Sponge, Non-Sterile 4x4 in 1 x Per Week/30 Days Discharge Instructions: Apply over primary dressing as directed. Com pression Wrap: ThreePress (3 layer compression wrap) 1 x Per Week/30 Days Discharge Instructions: Apply three layer compression as directed. 1. Silver alginate under 3 layer compression 2. Follow-up in 1 week 3. Daily juxta lite to the left leg Electronic Signature(s) Signed: 11/30/2021 4:20:55 PM By: Kalman Shan DO Entered By: Kalman Shan on 11/30/2021 10:59:51 -------------------------------------------------------------------------------- HxROS Details Patient Name: Date of Service: Betsey Holiday, Lavonne Chick D. 11/30/2021 10:15 A M Medical Record Number: [id number] Patient Account Number: [id number] Date of Birth/Sex: Treating RN: [birth year] (71 y.o. Vanessa Palmer Primary Care Provider: Maudry Mayhew Other Clinician: Referring Provider: Treating  Provider/Extender: Theressa Millard in Treatment: 3 Information Obtained From Chart Constitutional Symptoms (General Health) Medical History: Past Medical History Notes: chronic back pain Migraine last 2018 Hematologic/Lymphatic Medical History: Positive for: Anemia Respiratory Medical History: Positive for: Asthma; Sleep Apnea - 2018- oCPAP Cardiovascular Medical History: Positive for: Congestive Heart Failure; Coronary Artery Disease; Deep Vein Thrombosis - 2014 both legs; Hypertension; Myocardial Infarction - 2005 Past Medical History Notes: hyperlipidemia Gastrointestinal Medical History: Past Medical History Notes: GERD Genitourinary Medical History: Past Medical History Notes: stage III CKD Musculoskeletal Medical History: Positive for: Gout; Osteoarthritis Past Medical History Notes: DJD Neurologic Medical History: Positive for: Seizure Disorder - as a teenager Psychiatric Medical History: Past Medical History Notes: depression Immunizations Pneumococcal Vaccine: Received  Pneumococcal Vaccination: No Implantable Devices No devices added Hospitalization / Surgery History Type of Hospitalization/Surgery heart cath 2018, 2003, 2004, 2006 right total hip replacement 2016 hysterectomy Family and Social History Diabetes: Yes - Siblings; Heart Disease: Yes - Father,Siblings; Never smoker; Alcohol Use: Rarely - sober since 2006 Electronic Signature(s) Signed: 11/30/2021 4:20:55 PM By: Kalman Shan DO Signed: 11/30/2021 4:56:32 PM By: Lorrin Jackson Entered By: Kalman Shan on 11/30/2021 10:56:22 -------------------------------------------------------------------------------- SuperBill Details Patient Name: Date of Service: Betsey Holiday, Nikisha D. 11/30/2021 Medical Record Number: [id number] Patient Account Number: [id number] Date of Birth/Sex: Treating RN: 03/08/1950 (71 y.o. Vanessa Palmer Primary Care Provider: Maudry Mayhew Other Clinician: Referring Provider: Treating Provider/Extender: Theressa Millard in Treatment: 3 Diagnosis Coding ICD-10 Codes Code Description I89.0 Lymphedema, not elsewhere classified L97.818 Non-pressure chronic ulcer of other part of right lower leg with other specified severity L97.828 Non-pressure chronic ulcer of other part of left lower leg with other specified severity I87.303 Chronic venous hypertension (idiopathic) without complications of bilateral lower extremity Facility Procedures CPT4 Code: [id number] Description: (Facility Use Only) [phone] - [place] [id number] LWR RT LEG ICD-10 Diagnosis Description L97.818 Non-pressure chronic ulcer of other part of right lower leg with other specified sever Modifier: ity Quantity: 1 Physician Procedures : CPT4 Code Description Modifier [id number] - WC PHYS LEVEL 3 - EST PT ICD-10 Diagnosis Description L97.818 Non-pressure chronic ulcer of other part of right lower leg with other specified severity L97.828 Non-pressure chronic ulcer of other part of  left lower leg with other specified severity I89.0 Lymphedema, not elsewhere classified I87.303 Chronic venous hypertension (idiopathic) without complications of bilateral lower extremity Quantity: 1 Electronic Signature(s) Signed: 11/30/2021 4:20:55 PM By: Kalman Shan DO Entered By: Kalman Shan on 11/30/2021 11:01:35

## 2021-12-05 DIAGNOSIS — I82503 Chronic embolism and thrombosis of unspecified deep veins of lower extremity, bilateral: Secondary | ICD-10-CM | POA: Diagnosis not present

## 2021-12-05 DIAGNOSIS — Z91010 Allergy to peanuts: Secondary | ICD-10-CM | POA: Diagnosis not present

## 2021-12-05 DIAGNOSIS — Z0289 Encounter for other administrative examinations: Secondary | ICD-10-CM | POA: Diagnosis not present

## 2021-12-07 ENCOUNTER — Encounter (HOSPITAL_BASED_OUTPATIENT_CLINIC_OR_DEPARTMENT_OTHER): Payer: Medicare Other | Admitting: Internal Medicine

## 2021-12-07 DIAGNOSIS — I779 Disorder of arteries and arterioles, unspecified: Secondary | ICD-10-CM | POA: Diagnosis not present

## 2021-12-07 DIAGNOSIS — M109 Gout, unspecified: Secondary | ICD-10-CM | POA: Diagnosis not present

## 2021-12-07 DIAGNOSIS — L97828 Non-pressure chronic ulcer of other part of left lower leg with other specified severity: Secondary | ICD-10-CM | POA: Diagnosis not present

## 2021-12-07 DIAGNOSIS — N183 Chronic kidney disease, stage 3 unspecified: Secondary | ICD-10-CM | POA: Diagnosis not present

## 2021-12-07 DIAGNOSIS — I872 Venous insufficiency (chronic) (peripheral): Secondary | ICD-10-CM | POA: Diagnosis not present

## 2021-12-07 DIAGNOSIS — L97818 Non-pressure chronic ulcer of other part of right lower leg with other specified severity: Secondary | ICD-10-CM

## 2021-12-07 DIAGNOSIS — I890 Lymphedema, not elsewhere classified: Secondary | ICD-10-CM

## 2021-12-07 DIAGNOSIS — I87303 Chronic venous hypertension (idiopathic) without complications of bilateral lower extremity: Secondary | ICD-10-CM

## 2021-12-07 DIAGNOSIS — I130 Hypertensive heart and chronic kidney disease with heart failure and stage 1 through stage 4 chronic kidney disease, or unspecified chronic kidney disease: Secondary | ICD-10-CM | POA: Diagnosis not present

## 2021-12-07 DIAGNOSIS — I509 Heart failure, unspecified: Secondary | ICD-10-CM | POA: Diagnosis not present

## 2021-12-07 DIAGNOSIS — D631 Anemia in chronic kidney disease: Secondary | ICD-10-CM | POA: Diagnosis not present

## 2021-12-07 DIAGNOSIS — M5416 Radiculopathy, lumbar region: Secondary | ICD-10-CM | POA: Diagnosis not present

## 2021-12-07 DIAGNOSIS — M1990 Unspecified osteoarthritis, unspecified site: Secondary | ICD-10-CM | POA: Diagnosis not present

## 2021-12-07 NOTE — Progress Notes (Addendum)
Vanessa Palmer, Vanessa Palmer ([id number]) [id number] Page 1 of 10 Visit Report for 12/07/2021 Chief Complaint Document Details Patient Name: Date of Service: Vanessa Palmer, Vanessa Palmer 12/07/2021 12:30 PM Medical Record Number: [id number] Patient Account Number: [id number] Date of Birth/Sex: Treating RN: 1950/11/03 (71 y.o. F) Primary Care Provider: Maudry Mayhew Other Clinician: Referring Provider: Treating Provider/Extender: Theressa Millard in Treatment: 4 Information Obtained from: Patient Chief Complaint 09/07/2021; scattered open wounds to the lower extremities bilaterally 11/09/21; Patient returns to clinic with recurrent small weeping areas on her bilateral lower legs Electronic Signature(s) Signed: 12/07/2021 1:20:08 PM By: Kalman Shan DO Entered By: Kalman Shan on 12/07/2021 13:14:22 -------------------------------------------------------------------------------- HPI Details Patient Name: Date of Service: Vanessa Palmer, Vanessa Palmer. 12/07/2021 12:30 PM Medical Record Number: [id number] Patient Account Number: [id number] Date of Birth/Sex: Treating RN: [birth year] (71 y.o. F) Primary Care Provider: Maudry Mayhew Other Clinician: Referring Provider: Treating Provider/Extender: Theressa Millard in Treatment: 4 History of Present Illness HPI Description: Admission 09/07/2021 [name] is a 71 year old female with a past medical history of essential hypertension, chronic kidney disease stage III, and lymphedema that presents to the clinic for a 2-week history of scattered open wounds to her lower extremities bilaterally. She states this has been an ongoing issue for the past year. She states that the wounds wax and wane in healing. She currently takes torsemide. She does not wear compression stockings. She currently denies signs of infection. 7/20; patient presents for follow-up. She received 1 juxta lite  compression wrap in the mail. She was supposed to receive 2. She states she talked to the company and they are sending her her second pair. We used antibiotic ointment and silver alginate under 3 layer compression at last clinic visit. The silver alginate stuck to the wound bed. The left lower extremity wounds have healed. 7/27; patient presents for follow-up. She still has not received her juxta lite compression for the right leg. We spoke with the wound care supply company today and they stated it will arrive today. We have been using Xeroform under 3 layer compression to the right lower extremity. She has been using the juxta lite compression to the left lower extremity. Her wounds remain healed to the left lower extremity. READMISSION 11/09/2021 This is a patient that we had in clinic in July. She has lymphedema secondary to chronic venous insufficiency and had scattered open areas on her bilateral lower extremities she received 3 layer compression bilaterally the area is closed over and she was prescribed juxta lite stockings. She tells me she was compliant with the juxta lites, apparently she has home health aides to help her put these on. About 3 weeks ago she noted increase in swelling and weeping areas in her bilateral lower legs and she is in for review of this. She does not have arterial issues her ABIs in the clinic last time were quite normal bilaterally. She does have chronic kidney disease stage III and lumbar radiculopathy. 9/21; patient presents for follow-up. She presented last week with open wounds to her legs bilaterally. Silver alginate under 3 layer compression was used. Her left lower extremity wounds have healed. She has a juxta light compression at home. She still has open wounds on the right lower extremity. She denies signs of infection. She tolerated the compression wraps well. 9/28; as I understand things we actually healed out her left leg last week and transitioned her  to juxta lites however almost immediately fluid ripped out of Vanessa Palmer, Vanessa  Palmer ([id number]) [id number] Page 2 of 10 several open areas on the left leg. She comes in today with significant bilateral lower extremity edema. 10/5; patient presents for follow-up. Her left leg has healed again. She states she has her juxta lite compression wraps at home. We have been using silver alginate with 3 layer compression to the right lower extremity. She has no issues or complaints today. 10/12; patient presents for follow-up. We will wrapped her right leg with silver alginate under 3 layer compression at last clinic visit. Her left lower extremity wounds had healed and she was using her juxta lite compression wraps. Unfortunately she has reopened wounds to this leg. Electronic Signature(s) Signed: 12/07/2021 1:20:08 PM By: Geralyn Corwin DO Entered By: Geralyn Corwin on 12/07/2021 13:15:07 -------------------------------------------------------------------------------- Physical Exam Details Patient Name: Date of Service: Vanessa Palmer. 12/07/2021 12:30 PM Medical Record Number: [id number] Patient Account Number: [id number] Date of Birth/Sex: Treating RN: [birth year] (71 y.o. F) Primary Care Provider: Harriet Masson Other Clinician: Referring Provider: Treating Provider/Extender: Irineo Axon in Treatment: 4 Constitutional respirations regular, non-labored and within target range for patient.. Cardiovascular 2+ dorsalis pedis/posterior tibialis pulses. Psychiatric pleasant and cooperative. Notes Wounds to the lower extremities bilaterally with granulation tissue. 2+ pitting edema to the knee. No signs of surrounding infection. Electronic Signature(s) Signed: 12/07/2021 1:20:08 PM By: Geralyn Corwin DO Entered By: Geralyn Corwin on 12/07/2021  13:15:50 -------------------------------------------------------------------------------- Physician Orders Details Patient Name: Date of Service: Vanessa Palmer. 12/07/2021 12:30 PM Medical Record Number: [id number] Patient Account Number: [id number] Date of Birth/Sex: Treating RN: 11-19-1950 (71 y.o. Roel Cluck Primary Care Provider: Harriet Masson Other Clinician: Referring Provider: Treating Provider/Extender: Irineo Axon in Treatment: 4 Verbal / Phone Orders: No Diagnosis Coding Follow-up Appointments ppointment in 1 week. - with Dr. Mikey Bussing and Lennox Laity, [place] # 7 Return A Anesthetic (In clinic) Topical Lidocaine 5% applied to wound bed (In clinic) Topical Lidocaine 4% applied to wound bed Bathing/ Shower/ Hygiene May shower with protection but do not get wound dressing(s) wet. - Can use cast protector bag from [facility], [facility] or [facility], [place] Palmer ([id number]) [phone].pdf Page 3 of 10 Edema Control - Lymphedema / SCD / Other Elevate legs to the level of the heart or above for 30 minutes daily and/or when sitting, a frequency of: - throughout the day Avoid standing for long periods of time. Other Edema Control Orders/Instructions: - We will try and see about ordering lymphedema pumps after you've had at least 4 weeks of wearing compression wraps. Wound Treatment Wound #6 - Lower Leg Wound Laterality: Right, Posterior Cleanser: Soap and Water 1 x Per Week/30 Days Discharge Instructions: May shower and wash wound with dial antibacterial soap and water prior to dressing change. Cleanser: Wound Cleanser 1 x Per Week/30 Days Discharge Instructions: Cleanse the wound with wound cleanser prior to applying a clean dressing using gauze sponges, not tissue or cotton balls. Peri-Wound Care: Triamcinolone 15 (g) 1 x Per Week/30 Days Discharge Instructions: Use triamcinolone 15 (g) as directed Peri-Wound Care: Sween Lotion  (Moisturizing lotion) 1 x Per Week/30 Days Discharge Instructions: Apply moisturizing lotion as directed Prim Dressing: KerraCel Ag Gelling Fiber Dressing, 4x5 in (silver alginate) 1 x Per Week/30 Days ary Discharge Instructions: Apply silver alginate to wound bed as instructed Secondary Dressing: Woven Gauze Sponge, Non-Sterile 4x4 in 1 x Per Week/30 Days Discharge Instructions: Apply over primary dressing as directed. Compression Wrap: ThreePress (3 layer compression wrap) 1 x Per Week/30  Days Discharge Instructions: Apply three layer compression as directed. Wound #8 - Lower Leg Wound Laterality: Left, Posterior Cleanser: Soap and Water 1 x Per Week/30 Days Discharge Instructions: May shower and wash wound with dial antibacterial soap and water prior to dressing change. Cleanser: Wound Cleanser 1 x Per Week/30 Days Discharge Instructions: Cleanse the wound with wound cleanser prior to applying a clean dressing using gauze sponges, not tissue or cotton balls. Peri-Wound Care: Triamcinolone 15 (g) 1 x Per Week/30 Days Discharge Instructions: Use triamcinolone 15 (g) as directed Peri-Wound Care: Sween Lotion (Moisturizing lotion) 1 x Per Week/30 Days Discharge Instructions: Apply moisturizing lotion as directed Prim Dressing: KerraCel Ag Gelling Fiber Dressing, 4x5 in (silver alginate) 1 x Per Week/30 Days ary Discharge Instructions: Apply silver alginate to wound bed as instructed Secondary Dressing: Woven Gauze Sponge, Non-Sterile 4x4 in 1 x Per Week/30 Days Discharge Instructions: Apply over primary dressing as directed. Compression Wrap: ThreePress (3 layer compression wrap) 1 x Per Week/30 Days Discharge Instructions: Apply three layer compression as directed. Wound #9 - Lower Leg Wound Laterality: Left, Anterior Cleanser: Soap and Water 1 x Per Week/30 Days Discharge Instructions: May shower and wash wound with dial antibacterial soap and water prior to dressing change. Cleanser:  Wound Cleanser 1 x Per Week/30 Days Discharge Instructions: Cleanse the wound with wound cleanser prior to applying a clean dressing using gauze sponges, not tissue or cotton balls. Peri-Wound Care: Triamcinolone 15 (g) 1 x Per Week/30 Days Discharge Instructions: Use triamcinolone 15 (g) as directed Peri-Wound Care: Sween Lotion (Moisturizing lotion) 1 x Per Week/30 Days Discharge Instructions: Apply moisturizing lotion as directed Prim Dressing: KerraCel Ag Gelling Fiber Dressing, 4x5 in (silver alginate) 1 x Per Week/30 Days ary Discharge Instructions: Apply silver alginate to wound bed as instructed Secondary Dressing: Woven Gauze Sponge, Non-Sterile 4x4 in 1 x Per Week/30 Days Discharge Instructions: Apply over primary dressing as directed. Compression Wrap: ThreePress (3 layer compression wrap) 1 x Per Week/30 Days Discharge Instructions: Apply three layer compression as directed. Services and Therapies Venous Studies -Bilateral - Venous Reflux Studies, Bilalteral. Continued swelling and lower leg wounds. LAQUINTA, HAZELL ([id number]) [id number] Page 4 of 10 Electronic Signature(s) Signed: 12/07/2021 4:24:49 PM By: Lorrin Jackson Signed: 12/07/2021 4:46:31 PM By: Kalman Shan DO Previous Signature: 12/07/2021 1:20:08 PM Version By: Kalman Shan DO Entered By: Lorrin Jackson on 12/07/2021 13:22:13 Prescription 12/07/2021 -------------------------------------------------------------------------------- [place] Palmer. Kalman Shan DO Patient Name: Provider: 02-18-1951 [id number] Date of Birth: NPI#: [id number] Sex: DEA #: [phone] Phone #: License #: [employer] Patient Address: [address], [place] [address] [place], [place] [address] [phone] Allergies ACE Inhibitors; regadenoson; Lexiscan; Shellfish Containing Products; peanut oil;  hydrocodone; hydromorphone; oxycodone; propoxyphene; Vicodin Provider's Orders Venous Studies -Bilateral - Venous Reflux Studies, Bilalteral. Continued swelling and lower leg wounds. Hand Signature: Date(s): Electronic Signature(s) Signed: 12/07/2021 4:24:49 PM By: Lorrin Jackson Signed: 12/07/2021 4:46:31 PM By: Kalman Shan DO Entered By: Lorrin Jackson on 12/07/2021 13:22:13 -------------------------------------------------------------------------------- Problem List Details Patient Name: Date of Service: Vanessa Small Palmer. 12/07/2021 12:30 PM Medical Record Number: [id number] Patient Account Number: [id number] Date of Birth/Sex: Treating RN: 12-12-1950 (71 y.o. F) Primary Care Provider: Maudry Mayhew Other Clinician: Referring Provider: Treating Provider/Extender: Theressa Millard in Treatment: 4 Active Problems ICD-10 Encounter Code Description Active Date MDM Diagnosis I89.0 Lymphedema, not elsewhere classified 11/09/2021 No Yes L97.818 Non-pressure chronic ulcer  of other part of right lower leg with other specified 11/09/2021 No Yes severity Vanessa Palmer, Vanessa Palmer ([id number]) [id number] Page 5 of 10 [id number] Non-pressure chronic ulcer of other part of left lower leg with other specified 11/09/2021 No Yes severity I87.303 Chronic venous hypertension (idiopathic) without complications of bilateral 11/09/2021 No Yes lower extremity Inactive Problems Resolved Problems Electronic Signature(s) Signed: 12/07/2021 1:20:08 PM By: Kalman Shan DO Entered By: Kalman Shan on 12/07/2021 13:14:05 -------------------------------------------------------------------------------- Progress Note Details Patient Name: Date of Service: Vanessa Small Palmer. 12/07/2021 12:30 PM Medical Record Number: [id number] Patient Account Number: [id number] Date of Birth/Sex: Treating RN: 06-12-1950 (71 y.o. F) Primary Care Provider: Maudry Mayhew Other Clinician: Referring Provider: Treating Provider/Extender: Theressa Millard in Treatment: 4 Subjective Chief Complaint Information obtained from Patient 09/07/2021; scattered open wounds to the lower extremities bilaterally 11/09/21; Patient returns to clinic with recurrent small weeping areas on her bilateral lower legs History of Present Illness (HPI) Admission 09/07/2021 Ms. Bentlei Raible is a 71 year old female with a past medical history of essential hypertension, chronic kidney disease stage III, and lymphedema that presents to the clinic for a 2-week history of scattered open wounds to her lower extremities bilaterally. She states this has been an ongoing issue for the past year. She states that the wounds wax and wane in healing. She currently takes torsemide. She does not wear compression stockings. She currently denies signs of infection. 7/20; patient presents for follow-up. She received 1 juxta lite compression wrap in the mail. She was supposed to receive 2. She states she talked to the company and they are sending her her second pair. We used antibiotic ointment and silver alginate under 3 layer compression at last clinic visit. The silver alginate stuck to the wound bed. The left lower extremity wounds have healed. 7/27; patient presents for follow-up. She still has not received her juxta lite compression for the right leg. We spoke with the wound care supply company today and they stated it will arrive today. We have been using Xeroform under 3 layer compression to the right lower extremity. She has been using the juxta lite compression to the left lower extremity. Her wounds remain healed to the left lower extremity. READMISSION 11/09/2021 This is a patient that we had in clinic in July. She has lymphedema secondary to chronic venous insufficiency and had scattered open areas on her bilateral lower extremities she received 3 layer compression  bilaterally the area is closed over and she was prescribed juxta lite stockings. She tells me she was compliant with the juxta lites, apparently she has home health aides to help her put these on. About 3 weeks ago she noted increase in swelling and weeping areas in her bilateral lower legs and she is in for review of this. She does not have arterial issues her ABIs in the clinic last time were quite normal bilaterally. She does have chronic kidney disease stage III and lumbar radiculopathy. 9/21; patient presents for follow-up. She presented last week with open wounds to her legs bilaterally. Silver alginate under 3 layer compression was used. Her left lower extremity wounds have healed. She has a juxta light compression at home. She still has open wounds on the right lower extremity. She denies signs of infection. She tolerated the compression wraps well. 9/28; as I understand things we actually healed out her left leg last week and transitioned her to juxta lites however almost immediately fluid ripped out of several open areas on the left leg.  She comes in today with significant bilateral lower extremity edema. 10/5; patient presents for follow-up. Her left leg has healed again. She states she has her juxta lite compression wraps at home. We have been using silver alginate with 3 layer compression to the right lower extremity. She has no issues or complaints today. 10/12; patient presents for follow-up. We will wrapped her right leg with silver alginate under 3 layer compression at last clinic visit. Her left lower extremity Vanessa Palmer, Vanessa Palmer ([id number]) [id number] Page 6 of 10 wounds had healed and she was using her juxta lite compression wraps. Unfortunately she has reopened wounds to this leg. Patient History Information obtained from Chart. Family History Diabetes - Siblings, Heart Disease - Father,Siblings. Social History Never smoker, Alcohol Use - Rarely -  sober since 2006. Medical History Hematologic/Lymphatic Patient has history of Anemia Respiratory Patient has history of Asthma, Sleep Apnea - 2018- oCPAP Cardiovascular Patient has history of Congestive Heart Failure, Coronary Artery Disease, Deep Vein Thrombosis - 2014 both legs, Hypertension, Myocardial Infarction - 2005 Musculoskeletal Patient has history of Gout, Osteoarthritis Neurologic Patient has history of Seizure Disorder - as a teenager Hospitalization/Surgery History - heart cath 2018, 2003, 2004, 2006. - right total hip replacement 2016. - hysterectomy. Medical A Surgical History Notes nd Constitutional Symptoms (General Health) chronic back pain Migraine last 2018 Cardiovascular hyperlipidemia Gastrointestinal GERD Genitourinary stage III CKD Musculoskeletal DJD Psychiatric depression Objective Constitutional respirations regular, non-labored and within target range for patient.. Vitals Time Taken: 12:40 PM, Temperature: 98.5 F, Pulse: 60 bpm, Respiratory Rate: 18 breaths/min, Blood Pressure: 150/81 mmHg. Cardiovascular 2+ dorsalis pedis/posterior tibialis pulses. Psychiatric pleasant and cooperative. General Notes: Wounds to the lower extremities bilaterally with granulation tissue. 2+ pitting edema to the knee. No signs of surrounding infection. Integumentary (Hair, Skin) Wound #6 status is Open. Original cause of wound was Blister. The date acquired was: 10/23/2021. The wound has been in treatment 4 weeks. The wound is located on the Right,Posterior Lower Leg. The wound measures 1.7cm length x 0.4cm width x 0.1cm depth; 0.534cm^2 area and 0.053cm^3 volume. There is Fat Layer (Subcutaneous Tissue) exposed. There is no tunneling or undermining noted. There is a medium amount of serous drainage noted. The wound margin is distinct with the outline attached to the wound base. There is large (67-100%) red, pink granulation within the wound bed. There is a small  (1-33%) amount of necrotic tissue within the wound bed including Adherent Slough. The periwound skin appearance had no abnormalities noted for texture. The periwound skin appearance had no abnormalities noted for moisture. The periwound skin appearance exhibited: Hemosiderin Staining. The periwound skin appearance did not exhibit: Atrophie Blanche, Cyanosis, Ecchymosis, Mottled, Pallor, Rubor, Erythema. Periwound temperature was noted as No Abnormality. Wound #8 status is Open. Original cause of wound was Gradually Appeared. The date acquired was: 12/05/2021. The wound is located on the Left,Posterior Lower Leg. The wound measures 0.8cm length x 2cm width x 0.1cm depth; 1.257cm^2 area and 0.126cm^3 volume. There is Fat Layer (Subcutaneous Tissue) exposed. There is no tunneling or undermining noted. There is a medium amount of serosanguineous drainage noted. The wound margin is distinct with the outline attached to the wound base. There is large (67-100%) red granulation within the wound bed. There is no necrotic tissue within the wound bed. The periwound skin appearance had no abnormalities noted for texture. The periwound skin appearance had no abnormalities noted for moisture. The periwound skin appearance did not exhibit: Atrophie Blanche, Cyanosis, Ecchymosis, Hemosiderin Staining, Mottled,  Pallor, Rubor, Erythema. Periwound temperature was noted as No Abnormality. Wound #9 status is Open. Original cause of wound was Gradually Appeared. The date acquired was: 12/05/2021. The wound is located on the Left,Anterior Lower Leg. The wound measures 1.5cm length x 1.1cm width x 0.1cm depth; 1.296cm^2 area and 0.13cm^3 volume. There is Fat Layer (Subcutaneous Tissue) exposed. There is no tunneling or undermining noted. There is a medium amount of serosanguineous drainage noted. The wound margin is distinct with the outline attached to the wound base. There is large (67-100%) red, pink granulation within the  wound bed. There is a small (1-33%) amount of necrotic tissue within the wound bed including Adherent Slough. The periwound skin appearance did not exhibit: Callus, Crepitus, Excoriation, Induration, Rash, Scarring, Dry/Scaly, Maceration, Atrophie Blanche, Cyanosis, Ecchymosis, Hemosiderin Staining, Mottled, Pallor, Rubor, Erythema. Periwound temperature was noted as No Abnormality. Vanessa Palmer, Vanessa Palmer ([id number]) [id number] Page 7 of 10 Assessment Active Problems ICD-10 Lymphedema, not elsewhere classified Non-pressure chronic ulcer of other part of right lower leg with other specified severity Non-pressure chronic ulcer of other part of left lower leg with other specified severity Chronic venous hypertension (idiopathic) without complications of bilateral lower extremity Patient's right lower extremity wound is overall stable. Unfortunately she has reopened wounds to the left lower extremity. At this time I recommended silver alginate under compression therapy. I also recommended venous reflux studies. She may benefit from ablations if appropriate. Follow-up in 1 week. Procedures Wound #6 Pre-procedure diagnosis of Wound #6 is a Lymphedema located on the Right,Posterior Lower Leg . There was a Three Layer Compression Therapy Procedure by Lorrin Jackson, RN. Post procedure Diagnosis Wound #6: Same as Pre-Procedure Wound #8 Pre-procedure diagnosis of Wound #8 is a Lymphedema located on the Left,Posterior Lower Leg . There was a Three Layer Compression Therapy Procedure by Lorrin Jackson, RN. Post procedure Diagnosis Wound #8: Same as Pre-Procedure Wound #9 Pre-procedure diagnosis of Wound #9 is a Lymphedema located on the Left,Anterior Lower Leg . There was a Three Layer Compression Therapy Procedure by Lorrin Jackson, RN. Post procedure Diagnosis Wound #9: Same as Pre-Procedure Plan Follow-up Appointments: Return Appointment in 1 week. - with Dr. Heber Deming and  Leveda Anna, [place] # 7 Anesthetic: (In clinic) Topical Lidocaine 5% applied to wound bed (In clinic) Topical Lidocaine 4% applied to wound bed Bathing/ Shower/ Hygiene: May shower with protection but do not get wound dressing(s) wet. - Can use cast protector bag from [facility] or [employer] Edema Control - Lymphedema / SCD / Other: Elevate legs to the level of the heart or above for 30 minutes daily and/or when sitting, a frequency of: - throughout the day Avoid standing for long periods of time. Other Edema Control Orders/Instructions: - We will try and see about ordering lymphedema pumps after you've had at least 4 weeks of wearing compression wraps. WOUND #6: - Lower Leg Wound Laterality: Right, Posterior Cleanser: Soap and Water 1 x Per Week/30 Days Discharge Instructions: May shower and wash wound with dial antibacterial soap and water prior to dressing change. Cleanser: Wound Cleanser 1 x Per Week/30 Days Discharge Instructions: Cleanse the wound with wound cleanser prior to applying a clean dressing using gauze sponges, not tissue or cotton balls. Peri-Wound Care: Triamcinolone 15 (g) 1 x Per Week/30 Days Discharge Instructions: Use triamcinolone 15 (g) as directed Peri-Wound Care: Sween Lotion (Moisturizing lotion) 1 x Per Week/30 Days Discharge Instructions: Apply moisturizing lotion as directed Prim Dressing: KerraCel Ag Gelling Fiber Dressing, 4x5 in (silver alginate) 1 x  Per Week/30 Days ary Discharge Instructions: Apply silver alginate to wound bed as instructed Secondary Dressing: Woven Gauze Sponge, Non-Sterile 4x4 in 1 x Per Week/30 Days Discharge Instructions: Apply over primary dressing as directed. Com pression Wrap: ThreePress (3 layer compression wrap) 1 x Per Week/30 Days Discharge Instructions: Apply three layer compression as directed. WOUND #8: - Lower Leg Wound Laterality: Left, Posterior Cleanser: Soap and Water 1 x Per Week/30 Days Discharge Instructions: May shower  and wash wound with dial antibacterial soap and water prior to dressing change. Cleanser: Wound Cleanser 1 x Per Week/30 Days Discharge Instructions: Cleanse the wound with wound cleanser prior to applying a clean dressing using gauze sponges, not tissue or cotton balls. Peri-Wound Care: Triamcinolone 15 (g) 1 x Per Week/30 Days Discharge Instructions: Use triamcinolone 15 (g) as directed Peri-Wound Care: Sween Lotion (Moisturizing lotion) 1 x Per Week/30 Days Discharge Instructions: Apply moisturizing lotion as directed Prim Dressing: KerraCel Ag Gelling Fiber Dressing, 4x5 in (silver alginate) 1 x Per Week/30 Days ary Discharge Instructions: Apply silver alginate to wound bed as instructed Secondary Dressing: Woven Gauze Sponge, Non-Sterile 4x4 in 1 x Per Week/30 Days Vanessa Palmer, Vanessa Palmer ([id number]) [id number] Page 8 of 10 Discharge Instructions: Apply over primary dressing as directed. Com pression Wrap: ThreePress (3 layer compression wrap) 1 x Per Week/30 Days Discharge Instructions: Apply three layer compression as directed. WOUND #9: - Lower Leg Wound Laterality: Left, Anterior Cleanser: Soap and Water 1 x Per Week/30 Days Discharge Instructions: May shower and wash wound with dial antibacterial soap and water prior to dressing change. Cleanser: Wound Cleanser 1 x Per Week/30 Days Discharge Instructions: Cleanse the wound with wound cleanser prior to applying a clean dressing using gauze sponges, not tissue or cotton balls. Peri-Wound Care: Triamcinolone 15 (g) 1 x Per Week/30 Days Discharge Instructions: Use triamcinolone 15 (g) as directed Peri-Wound Care: Sween Lotion (Moisturizing lotion) 1 x Per Week/30 Days Discharge Instructions: Apply moisturizing lotion as directed Prim Dressing: KerraCel Ag Gelling Fiber Dressing, 4x5 in (silver alginate) 1 x Per Week/30 Days ary Discharge Instructions: Apply silver alginate to wound bed as instructed Secondary  Dressing: Woven Gauze Sponge, Non-Sterile 4x4 in 1 x Per Week/30 Days Discharge Instructions: Apply over primary dressing as directed. Com pression Wrap: ThreePress (3 layer compression wrap) 1 x Per Week/30 Days Discharge Instructions: Apply three layer compression as directed. 1. Silver alginate under 3 layer compression to the lower extremities bilaterally 2. Venous reflux studies 3. Follow-up in 1 week Electronic Signature(s) Signed: 12/07/2021 1:20:08 PM By: Kalman Shan DO Entered By: Kalman Shan on 12/07/2021 13:18:23 -------------------------------------------------------------------------------- HxROS Details Patient Name: Date of Service: Vanessa Palmer, Vanessa Palmer. 12/07/2021 12:30 PM Medical Record Number: [id number] Patient Account Number: [id number] Date of Birth/Sex: Treating RN: 1951/01/12 (71 y.o. F) Primary Care Provider: Maudry Mayhew Other Clinician: Referring Provider: Treating Provider/Extender: Theressa Millard in Treatment: 4 Information Obtained From Chart Constitutional Symptoms (General Health) Medical History: Past Medical History Notes: chronic back pain Migraine last 2018 Hematologic/Lymphatic Medical History: Positive for: Anemia Respiratory Medical History: Positive for: Asthma; Sleep Apnea - 2018- oCPAP Cardiovascular Medical History: Positive for: Congestive Heart Failure; Coronary Artery Disease; Deep Vein Thrombosis - 2014 both legs; Hypertension; Myocardial Infarction - 2005 Past Medical History Notes: hyperlipidemia Gastrointestinal Medical History: Past Medical History Notes: GERD Vanessa Palmer, Vanessa Palmer ([id number]) [id number] Page 9 of 10 Genitourinary Medical History: Past Medical History Notes: stage III CKD Musculoskeletal Medical History: Positive for: Gout; Osteoarthritis Past Medical History Notes: DJD  Neurologic Medical History: Positive for: Seizure Disorder - as a  teenager Psychiatric Medical History: Past Medical History Notes: depression Immunizations Pneumococcal Vaccine: Received Pneumococcal Vaccination: No Implantable Devices No devices added Hospitalization / Surgery History Type of Hospitalization/Surgery heart cath 2018, 2003, 2004, 2006 right total hip replacement 2016 hysterectomy Family and Social History Diabetes: Yes - Siblings; Heart Disease: Yes - Father,Siblings; Never smoker; Alcohol Use: Rarely - sober since 2006 Electronic Signature(s) Signed: 12/07/2021 1:20:08 PM By: Kalman Shan DO Entered By: Kalman Shan on 12/07/2021 13:15:16 -------------------------------------------------------------------------------- SuperBill Details Patient Name: Date of Service: Vanessa Palmer, Vanessa Palmer. 12/07/2021 Medical Record Number: [id number] Patient Account Number: [id number] Date of Birth/Sex: Treating RN: [birth year] (71 y.o. Sue Lush Primary Care Provider: Maudry Mayhew Other Clinician: Referring Provider: Treating Provider/Extender: Theressa Millard in Treatment: 4 Diagnosis Coding ICD-10 Codes Code Description I89.0 Lymphedema, not elsewhere classified L97.818 Non-pressure chronic ulcer of other part of right lower leg with other specified severity L97.828 Non-pressure chronic ulcer of other part of left lower leg with other specified severity I87.303 Chronic venous hypertension (idiopathic) without complications of bilateral lower extremity Facility Procedures : [facility], [facility] ([id number] foot I Description: [id number]) [phone] 1 BILATERAL: Application of multi-layer venous compression system; leg (below knee), including ankle and . CD-10 Diagnosis Description L97.818 Non-pressure chronic ulcer of other part of right lower leg with other specified severity  L97.828 Non-pressure chronic ulcer of other part of left lower leg with other specified  severity Modifier: [id number] Quantity: 227.pdf Page 10 of 10 1 Physician Procedures : CPT4 Code Description Modifier [id number] - WC PHYS LEVEL 3 - EST PT ICD-10 Diagnosis Description L97.818 Non-pressure chronic ulcer of other part of right lower leg with other specified severity L97.828 Non-pressure chronic ulcer of other part of  left lower leg with other specified severity I87.303 Chronic venous hypertension (idiopathic) without complications of bilateral lower extremity I89.0 Lymphedema, not elsewhere classified Quantity: 1 Electronic Signature(s) Signed: 12/07/2021 1:20:08 PM By: Kalman Shan DO Entered By: Kalman Shan on 12/07/2021 13:18:39

## 2021-12-07 NOTE — Progress Notes (Signed)
MICHAELLE, BOTTOMLEY ([id number]) [id number] Page 1 of 12 Visit Report for 12/07/2021 Arrival Information Details Patient Name: Date of Service: Vanessa Palmer, Vanessa Palmer 12/07/2021 12:30 PM Medical Record Number: [id number] Patient Account Number: [id number] Date of Birth/Sex: Treating RN: December 08, 1950 (71 y.o. Sue Lush Primary Care Jasreet Dickie: Maudry Mayhew Other Clinician: Referring Kelli Egolf: Treating Tejon Gracie/Extender: Theressa Millard in Treatment: 4 Visit Information History Since Last Visit Added or deleted any medications: No Patient Arrived: Wheel Chair Any new allergies or adverse reactions: No Arrival Time: 12:35 Had a fall or experienced change in No Transfer Assistance: None activities of daily living that may affect Patient Identification Verified: Yes risk of falls: Secondary Verification Process Completed: Yes Signs or symptoms of abuse/neglect since last visito No Patient Requires Transmission-Based Precautions: No Hospitalized since last visit: No Patient Has Alerts: Yes Implantable device outside of the clinic excluding No Patient Alerts: Patient on Blood Thinner cellular tissue based products placed in the center ABI's= R 1.05 L1.16 since last visit: Has Dressing in Place as Prescribed: Yes Has Compression in Place as Prescribed: Yes Pain Present Now: No Electronic Signature(s) Signed: 12/07/2021 4:24:49 PM By: Lorrin Jackson Entered By: Lorrin Jackson on 12/07/2021 12:40:31 -------------------------------------------------------------------------------- Compression Therapy Details Patient Name: Date of Service: Vanessa Small Palmer. 12/07/2021 12:30 PM Medical Record Number: [id number] Patient Account Number: [id number] Date of Birth/Sex: Treating RN: [birth year] (70 y.o. Sue Lush Primary Care Terence Googe: Maudry Mayhew Other Clinician: Referring Breelyn Icard: Treating Kortnee Bas/Extender: Theressa Millard in Treatment: 4 Compression Therapy Performed for Wound Assessment: Wound #6 Right,Posterior Lower Leg Performed By: Clinician Lorrin Jackson, RN Compression Type: Three Layer Post Procedure Diagnosis Same as Pre-procedure Electronic Signature(s) Signed: 12/07/2021 4:24:49 PM By: Lorrin Jackson Entered By: Lorrin Jackson on 12/07/2021 13:11:14 Dupin, Vanessa Chick Palmer ([id number]) [id number] Page 2 of 12 -------------------------------------------------------------------------------- Compression Therapy Details Patient Name: Date of Service: Vanessa Palmer, Vanessa Palmer 12/07/2021 12:30 PM Medical Record Number: [id number] Patient Account Number: [id number] Date of Birth/Sex: Treating RN: October 20, 1950 (71 y.o. Sue Lush Primary Care Aaron Bostwick: Maudry Mayhew Other Clinician: Referring Taliah Porche: Treating Latacha Texeira/Extender: Theressa Millard in Treatment: 4 Compression Therapy Performed for Wound Assessment: Wound #8 Left,Posterior Lower Leg Performed By: Clinician Lorrin Jackson, RN Compression Type: Three Layer Post Procedure Diagnosis Same as Pre-procedure Electronic Signature(s) Signed: 12/07/2021 4:24:49 PM By: Lorrin Jackson Entered By: Lorrin Jackson on 12/07/2021 13:11:14 -------------------------------------------------------------------------------- Compression Therapy Details Patient Name: Date of Service: Vanessa Small Palmer. 12/07/2021 12:30 PM Medical Record Number: [id number] Patient Account Number: [id number] Date of Birth/Sex: Treating RN: 1950-10-14 (71 y.o. Sue Lush Primary Care Rashidah [place]: Maudry Mayhew Other Clinician: Referring Junior Huezo: Treating Rucha Wissinger/Extender: Theressa Millard in Treatment: 4 Compression Therapy Performed for Wound Assessment: Wound #9 Left,Anterior Lower Leg Performed By: Clinician Lorrin Jackson, RN Compression Type: Three  Layer Post Procedure Diagnosis Same as Pre-procedure Electronic Signature(s) Signed: 12/07/2021 4:24:49 PM By: Lorrin Jackson Entered By: Lorrin Jackson on 12/07/2021 13:11:14 -------------------------------------------------------------------------------- Encounter Discharge Information Details Patient Name: Date of Service: Vanessa Palmer, Vanessa Chick Palmer. 12/07/2021 12:30 PM Medical Record Number: [id number] Patient Account Number: [id number] Date of Birth/Sex: Treating RN: Jan 27, 1951 (71 y.o. Sue Lush Primary Care Galilee Pierron: Maudry Mayhew Other Clinician: Referring Leticia Coletta: Treating Sherrell Weir/Extender: Theressa Millard in Treatment: 4 Encounter Discharge Information Items Discharge Condition: Stable Ambulatory Status: Wheelchair Discharge Destination: Home Transportation: Private Auto Schedule Follow-up Appointment: Yes Clinical Summary of Care: Provided on 12/07/2021 Form Type Recipient Paper Patient Patient ALWINE, JESUS  Palmer ([id number]) [id number] Page 3 of 12 Electronic Signature(s) Signed: 12/07/2021 4:24:49 PM By: Lorrin Jackson Entered By: Lorrin Jackson on 12/07/2021 13:14:49 -------------------------------------------------------------------------------- Lower Extremity Assessment Details Patient Name: Date of Service: Vanessa Palmer, Vanessa Palmer 12/07/2021 12:30 PM Medical Record Number: [id number] Patient Account Number: [id number] Date of Birth/Sex: Treating RN: [birth year] (71 y.o. Sue Lush Primary Care Khris Jansson: Maudry Mayhew Other Clinician: Referring Jovonni Borquez: Treating Taevyn Hausen/Extender: Theressa Millard in Treatment: 4 Edema Assessment Assessed: [name]: Yes] [name]: Yes] Edema: [Left: Yes] [Right: Yes] Calf Left: Right: Point of Measurement: 28 cm From Medial Instep 49 cm 46.5 cm Ankle Left: Right: Point of Measurement: 9 cm From Medial Instep 28 cm 25 cm Vascular  Assessment Pulses: Dorsalis Pedis Palpable: [Left:Yes] [Right:Yes] Electronic Signature(s) Signed: 12/07/2021 4:24:49 PM By: Lorrin Jackson Entered By: Lorrin Jackson on 12/07/2021 12:53:15 -------------------------------------------------------------------------------- Multi Wound Chart Details Patient Name: Date of Service: Vanessa Small Palmer. 12/07/2021 12:30 PM Medical Record Number: [id number] Patient Account Number: [id number] Date of Birth/Sex: Treating RN: 11-Oct-1950 (70 y.o. F) Primary Care Arali Somera: Maudry Mayhew Other Clinician: Referring Diamonds Lippard: Treating Camar Guyton/Extender: Theressa Millard in Treatment: 4 Vital Signs Height(in): Pulse(bpm): 60 Weight(lbs): Blood Pressure(mmHg): 150/81 Body Mass Index(BMI): Temperature(F): 98.5 Respiratory Rate(breaths/min): 18 [6:Photos:] [9:[id number] Page 4 of 12] Right, Posterior Lower Leg Left, Posterior Lower Leg Left, Anterior Lower Leg Wound Location: Blister Gradually Appeared Gradually Appeared Wounding Event: Lymphedema Lymphedema Lymphedema Primary Etiology: Anemia, Asthma, Sleep Apnea, Anemia, Asthma, Sleep Apnea, Anemia, Asthma, Sleep Apnea, Comorbid History: Congestive Heart Failure, Coronary Congestive Heart Failure, Coronary Congestive Heart Failure, Coronary Artery Disease, Deep Vein Artery Disease, Deep Vein Artery Disease, Deep Vein Thrombosis, Hypertension, Myocardial Thrombosis, Hypertension, Myocardial Thrombosis, Hypertension, Myocardial Infarction, Gout, Osteoarthritis, SeizureInfarction, Gout, Osteoarthritis, SeizureInfarction, Gout, Osteoarthritis, Seizure Disorder Disorder Disorder 10/23/2021 12/05/2021 12/05/2021 Date Acquired: 4 0 0 Weeks of Treatment: Open Open Open Wound Status: No No No Wound Recurrence: 1.7x0.4x0.1 0.8x2x0.1 1.5x1.1x0.1 Measurements L x W x Palmer (cm) 0.534 1.257 1.296 A (cm) : rea 0.053 0.126 0.13 Volume (cm)  : -240.10% N/A N/A % Reduction in Area: -231.20% N/A N/A % Reduction in Volume: Full Thickness Without Exposed Full Thickness Without Exposed Full Thickness Without Exposed Classification: Support Structures Support Structures Support Structures Medium Medium Medium Exudate Amount: Serous Serosanguineous Serosanguineous Exudate Type: amber red, brown red, brown Exudate Color: Distinct, outline attached Distinct, outline attached Distinct, outline attached Wound Margin: Large (67-100%) Large (67-100%) Large (67-100%) Granulation Amount: Red, Pink Red Red, Pink Granulation Quality: Small (1-33%) None Present (0%) Small (1-33%) Necrotic Amount: Fat Layer (Subcutaneous Tissue): Yes Fat Layer (Subcutaneous Tissue): Yes Fat Layer (Subcutaneous Tissue): Yes Exposed Structures: Fascia: No Fascia: No Fascia: No Tendon: No Tendon: No Tendon: No Muscle: No Muscle: No Muscle: No Joint: No Joint: No Joint: No Bone: No Bone: No Bone: No Small (1-33%) None None Epithelialization: Excoriation: No Excoriation: No Excoriation: No Periwound Skin Texture: Induration: No Induration: No Induration: No Callus: No Callus: No Callus: No Crepitus: No Crepitus: No Crepitus: No Rash: No Rash: No Rash: No Scarring: No Scarring: No Scarring: No Maceration: No Maceration: No Maceration: No Periwound Skin Moisture: Dry/Scaly: No Dry/Scaly: No Dry/Scaly: No Hemosiderin Staining: Yes Atrophie Blanche: No Atrophie Blanche: No Periwound Skin Color: Atrophie Blanche: No Cyanosis: No Cyanosis: No Cyanosis: No Ecchymosis: No Ecchymosis: No Ecchymosis: No Erythema: No Erythema: No Erythema: No Hemosiderin Staining: No Hemosiderin Staining: No Mottled: No Mottled: No Mottled: No Pallor: No Pallor: No Pallor: No Rubor: No Rubor: No Rubor: No  No Abnormality No Abnormality No Abnormality Temperature: Compression Therapy Compression Therapy Compression  Therapy Procedures Performed: Treatment Notes Electronic Signature(s) Signed: 12/07/2021 1:20:08 PM By: Kalman Shan DO Entered By: Kalman Shan on 12/07/2021 13:14:11 -------------------------------------------------------------------------------- Multi-Disciplinary Care Plan Details Patient Name: Date of Service: Vanessa Small Palmer. 12/07/2021 12:30 PM Medical Record Number: [id number] Patient Account Number: [id number] Date of Birth/Sex: Treating RN: 1950-06-17 (71 y.o. Sue Lush Primary Care Kamaria Lucia: Maudry Mayhew Other Clinician: LULIE, Vanessa Palmer ([id number]) [id number].pdf Page 5 of 12 Referring Tatjana Turcott: Treating Naomie Crow/Extender: Theressa Millard in Treatment: 4 Active Inactive Venous Leg Ulcer Nursing Diagnoses: Actual venous Insuffiency (use after diagnosis is confirmed) Goals: Patient will maintain optimal edema control Date Initiated: 11/09/2021 Target Resolution Date: 01/04/2022 Goal Status: Active Interventions: Assess peripheral edema status every visit. Compression as ordered Provide education on venous insufficiency Treatment Activities: Therapeutic compression applied : 11/09/2021 Notes: Wound/Skin Impairment Nursing Diagnoses: Impaired tissue integrity Goals: Patient/caregiver will verbalize understanding of skin care regimen Date Initiated: 11/09/2021 Target Resolution Date: 01/04/2022 Goal Status: Active Ulcer/skin breakdown will have a volume reduction of 30% by week 4 Date Initiated: 11/09/2021 Date Inactivated: 12/07/2021 Target Resolution Date: 12/06/2021 Goal Status: Met Interventions: Assess patient/caregiver ability to obtain necessary supplies Assess patient/caregiver ability to perform ulcer/skin care regimen upon admission and as needed Assess ulceration(s) every visit Provide education on ulcer and skin care Treatment Activities: Topical wound management initiated :  11/09/2021 Notes: Electronic Signature(s) Signed: 12/07/2021 4:24:49 PM By: Lorrin Jackson Entered By: Lorrin Jackson on 12/07/2021 12:35:39 -------------------------------------------------------------------------------- Pain Assessment Details Patient Name: Date of Service: Vanessa Small Palmer. 12/07/2021 12:30 PM Medical Record Number: [id number] Patient Account Number: [id number] Date of Birth/Sex: Treating RN: October 20, 1950 (71 y.o. Sue Lush Primary Care Tellis Spivak: Maudry Mayhew Other Clinician: Referring Jaiyon Wander: Treating Mccade Sullenberger/Extender: Theressa Millard in Treatment: 4 Active Problems Location of Pain Severity and Description of Pain Patient Has Paino No Vanessa Palmer, Vanessa Palmer ([id number]) [id number] Page 6 of 12 Patient Has Paino No Site Locations Pain Management and Medication Current Pain Management: Electronic Signature(s) Signed: 12/07/2021 4:24:49 PM By: Lorrin Jackson Entered By: Lorrin Jackson on 12/07/2021 12:43:26 -------------------------------------------------------------------------------- Patient/Caregiver Education Details Patient Name: Date of Service: Vanessa Small Palmer. 10/12/2023andnbsp12:30 PM Medical Record Number: [id number] Patient Account Number: [id number] Date of Birth/Gender: Treating RN: [birth year] (71 y.o. Sue Lush Primary Care Physician: Maudry Mayhew Other Clinician: Referring Physician: Treating Physician/Extender: Theressa Millard in Treatment: 4 Education Assessment Education Provided To: Patient Education Topics Provided Venous: Methods: Explain/Verbal, Printed Responses: State content correctly Wound/Skin Impairment: Methods: Explain/Verbal, Printed Responses: State content correctly Electronic Signature(s) Signed: 12/07/2021 4:24:49 PM By: Lorrin Jackson Entered By: Lorrin Jackson on 12/07/2021 12:35:57 Vanessa Palmer, Vanessa Palmer ([id number])  [id number] Page 7 of 12 -------------------------------------------------------------------------------- Wound Assessment Details Patient Name: Date of Service: Vanessa Palmer, Vanessa Palmer 12/07/2021 12:30 PM Medical Record Number: [id number] Patient Account Number: [id number] Date of Birth/Sex: Treating RN: 03/14/1950 (71 y.o. Sue Lush Primary Care Naaman Curro: Maudry Mayhew Other Clinician: Referring Britton Perkinson: Treating Triana Coover/Extender: Theressa Millard in Treatment: 4 Wound Status Wound Number: 6 Primary Lymphedema Etiology: Wound Location: Right, Posterior Lower Leg Wound Open Wounding Event: Blister Status: Date Acquired: 10/23/2021 Comorbid Anemia, Asthma, Sleep Apnea, Congestive Heart Failure, Coronary Weeks Of Treatment: 4 History: Artery Disease, Deep Vein Thrombosis, Hypertension, Myocardial Clustered Wound: No Infarction, Gout, Osteoarthritis, Seizure Disorder Photos Wound Measurements Length: (cm) 1.7 Width: (cm) 0.4 Depth: (cm) 0.1 Area: (cm) 0.534 Volume: (cm) 0.053 % Reduction  in Area: -240.1% % Reduction in Volume: -231.2% Epithelialization: Small (1-33%) Tunneling: No Undermining: No Wound Description Classification: Full Thickness Without Exposed Suppor Wound Margin: Distinct, outline attached Exudate Amount: Medium Exudate Type: Serous Exudate Color: amber t Structures Foul Odor After Cleansing: No Slough/Fibrino Yes Wound Bed Granulation Amount: Large (67-100%) Exposed Structure Granulation Quality: Red, Pink Fascia Exposed: No Necrotic Amount: Small (1-33%) Fat Layer (Subcutaneous Tissue) Exposed: Yes Necrotic Quality: Adherent Slough Tendon Exposed: No Muscle Exposed: No Joint Exposed: No Bone Exposed: No Periwound Skin Texture Texture Color No Abnormalities Noted: Yes No Abnormalities Noted: No Atrophie Blanche: No Moisture Cyanosis: No No Abnormalities Noted: Yes Ecchymosis:  No Erythema: No Hemosiderin Staining: Yes Mottled: No Pallor: No Rubor: No Temperature / Pain Temperature: No Abnormality Treatment Notes Wound #6 (Lower Leg) Wound Laterality: Right, Posterior ANNABEL, GIBEAU Palmer ([id number]) [id number] Page 8 of 12 Cleanser Soap and Water Discharge Instruction: May shower and wash wound with dial antibacterial soap and water prior to dressing change. Wound Cleanser Discharge Instruction: Cleanse the wound with wound cleanser prior to applying a clean dressing using gauze sponges, not tissue or cotton balls. Peri-Wound Care Triamcinolone 15 (g) Discharge Instruction: Use triamcinolone 15 (g) as directed Sween Lotion (Moisturizing lotion) Discharge Instruction: Apply moisturizing lotion as directed Topical Primary Dressing KerraCel Ag Gelling Fiber Dressing, 4x5 in (silver alginate) Discharge Instruction: Apply silver alginate to wound bed as instructed Secondary Dressing Woven Gauze Sponge, Non-Sterile 4x4 in Discharge Instruction: Apply over primary dressing as directed. Secured With Compression Wrap ThreePress (3 layer compression wrap) Discharge Instruction: Apply three layer compression as directed. Compression Stockings Add-Ons Electronic Signature(s) Signed: 12/07/2021 4:24:49 PM By: Lorrin Jackson Entered By: Lorrin Jackson on 12/07/2021 13:01:41 -------------------------------------------------------------------------------- Wound Assessment Details Patient Name: Date of Service: Vanessa Small Palmer. 12/07/2021 12:30 PM Medical Record Number: [id number] Patient Account Number: [id number] Date of Birth/Sex: Treating RN: Mar 21, 1950 (71 y.o. Sue Lush Primary Care Braileigh Landenberger: Maudry Mayhew Other Clinician: Referring Nikko Quast: Treating Lylia Karn/Extender: Theressa Millard in Treatment: 4 Wound Status Wound Number: 8 Primary Lymphedema Etiology: Wound Location: Left, Posterior  Lower Leg Wound Open Wounding Event: Gradually Appeared Status: Date Acquired: 12/05/2021 Comorbid Anemia, Asthma, Sleep Apnea, Congestive Heart Failure, Coronary Weeks Of Treatment: 0 History: Artery Disease, Deep Vein Thrombosis, Hypertension, Myocardial Clustered Wound: No Infarction, Gout, Osteoarthritis, Seizure Disorder Photos ENNIS, DELPOZO ([id number]) [id number] Page 9 of 12 Wound Measurements Length: (cm) 0.8 Width: (cm) 2 Depth: (cm) 0.1 Area: (cm) 1.257 Volume: (cm) 0.126 % Reduction in Area: % Reduction in Volume: Epithelialization: None Tunneling: No Undermining: No Wound Description Classification: Full Thickness Without Exposed Support Structures Wound Margin: Distinct, outline attached Exudate Amount: Medium Exudate Type: Serosanguineous Exudate Color: red, brown Foul Odor After Cleansing: No Slough/Fibrino No Wound Bed Granulation Amount: Large (67-100%) Exposed Structure Granulation Quality: Red Fascia Exposed: No Necrotic Amount: None Present (0%) Fat Layer (Subcutaneous Tissue) Exposed: Yes Tendon Exposed: No Muscle Exposed: No Joint Exposed: No Bone Exposed: No Periwound Skin Texture Texture Color No Abnormalities Noted: Yes No Abnormalities Noted: No Atrophie Blanche: No Moisture Cyanosis: No No Abnormalities Noted: Yes Ecchymosis: No Erythema: No Hemosiderin Staining: No Mottled: No Pallor: No Rubor: No Temperature / Pain Temperature: No Abnormality Treatment Notes Wound #8 (Lower Leg) Wound Laterality: Left, Posterior Cleanser Soap and Water Discharge Instruction: May shower and wash wound with dial antibacterial soap and water prior to dressing change. Wound Cleanser Discharge Instruction: Cleanse the wound with wound cleanser prior to applying a clean dressing using gauze  sponges, not tissue or cotton balls. Peri-Wound Care Triamcinolone 15 (g) Discharge Instruction: Use triamcinolone 15 (g) as  directed Sween Lotion (Moisturizing lotion) Discharge Instruction: Apply moisturizing lotion as directed Topical Primary Dressing KerraCel Ag Gelling Fiber Dressing, 4x5 in (silver alginate) Discharge Instruction: Apply silver alginate to wound bed as instructed Secondary Dressing Woven Gauze Sponge, Non-Sterile 4x4 in Discharge Instruction: Apply over primary dressing as directed. Secured With Compression Wrap ThreePress (3 layer compression wrap) Discharge Instruction: Apply three layer compression as directed. Compression Stockings Add-Ons AYSHIA, GRAMLICH ([id number]) [id number] Page 10 of 12 Electronic Signature(s) Signed: 12/07/2021 4:24:49 PM By: Lorrin Jackson Entered By: Lorrin Jackson on 12/07/2021 13:00:23 -------------------------------------------------------------------------------- Wound Assessment Details Patient Name: Date of Service: EMMAGRACE, RUNKEL Palmer. 12/07/2021 12:30 PM Medical Record Number: [id number] Patient Account Number: [id number] Date of Birth/Sex: Treating RN: Apr 10, 1950 (71 y.o. Sue Lush Primary Care Nashid Pellum: Maudry Mayhew Other Clinician: Referring Dao Mearns: Treating Bryona Foxworthy/Extender: Theressa Millard in Treatment: 4 Wound Status Wound Number: 9 Primary Lymphedema Etiology: Wound Location: Left, Anterior Lower Leg Wound Open Wounding Event: Gradually Appeared Status: Date Acquired: 12/05/2021 Comorbid Anemia, Asthma, Sleep Apnea, Congestive Heart Failure, Coronary Weeks Of Treatment: 0 History: Artery Disease, Deep Vein Thrombosis, Hypertension, Myocardial Clustered Wound: No Infarction, Gout, Osteoarthritis, Seizure Disorder Photos Wound Measurements Length: (cm) 1.5 Width: (cm) 1.1 Depth: (cm) 0.1 Area: (cm) 1.296 Volume: (cm) 0.13 % Reduction in Area: % Reduction in Volume: Epithelialization: None Tunneling: No Undermining: No Wound Description Classification:  Full Thickness Without Exposed Suppor Wound Margin: Distinct, outline attached Exudate Amount: Medium Exudate Type: Serosanguineous Exudate Color: red, brown t Structures Foul Odor After Cleansing: No Slough/Fibrino Yes Wound Bed Granulation Amount: Large (67-100%) Exposed Structure Granulation Quality: Red, Pink Fascia Exposed: No Necrotic Amount: Small (1-33%) Fat Layer (Subcutaneous Tissue) Exposed: Yes Necrotic Quality: Adherent Slough Tendon Exposed: No Muscle Exposed: No Joint Exposed: No Bone Exposed: No Periwound Skin Texture Texture Color No Abnormalities Noted: No No Abnormalities Noted: No Callus: No Atrophie Blanche: No Crepitus: No Cyanosis: No Excoriation: No Ecchymosis: No Induration: No Erythema: No COLLETTA, SPILLERS ([id number]) [id number] Page 11 of 12 Rash: No Hemosiderin Staining: No Scarring: No Mottled: No Pallor: No Moisture Rubor: No No Abnormalities Noted: No Dry / Scaly: No Temperature / Pain Maceration: No Temperature: No Abnormality Treatment Notes Wound #9 (Lower Leg) Wound Laterality: Left, Anterior Cleanser Soap and Water Discharge Instruction: May shower and wash wound with dial antibacterial soap and water prior to dressing change. Wound Cleanser Discharge Instruction: Cleanse the wound with wound cleanser prior to applying a clean dressing using gauze sponges, not tissue or cotton balls. Peri-Wound Care Triamcinolone 15 (g) Discharge Instruction: Use triamcinolone 15 (g) as directed Sween Lotion (Moisturizing lotion) Discharge Instruction: Apply moisturizing lotion as directed Topical Primary Dressing KerraCel Ag Gelling Fiber Dressing, 4x5 in (silver alginate) Discharge Instruction: Apply silver alginate to wound bed as instructed Secondary Dressing Woven Gauze Sponge, Non-Sterile 4x4 in Discharge Instruction: Apply over primary dressing as directed. Secured With Compression Wrap ThreePress (3  layer compression wrap) Discharge Instruction: Apply three layer compression as directed. Compression Stockings Add-Ons Electronic Signature(s) Signed: 12/07/2021 4:24:49 PM By: Lorrin Jackson Entered By: Lorrin Jackson on 12/07/2021 13:00:49 -------------------------------------------------------------------------------- Vitals Details Patient Name: Date of Service: Vanessa Palmer, Olesya Palmer. 12/07/2021 12:30 PM Medical Record Number: [id number] Patient Account Number: [id number] Date of Birth/Sex: Treating RN: 01-21-1951 (71 y.o. Sue Lush Primary Care Libertie Hausler: Maudry Mayhew Other Clinician: Referring Jonay Hitchcock: Treating Shiquita Collignon/Extender: Theressa Millard  in Treatment: 4 Vital Signs Time Taken: 12:40 Temperature (F): 98.5 Pulse (bpm): 60 Respiratory Rate (breaths/min): 18 Blood Pressure (mmHg): 150/81 Reference Range: 80 - 120 mg / dl Electronic Signature(s) Signed: 12/07/2021 4:24:49 PM By: Marlyne Beards, IDELL10/01/2022 4:24:49 PM By: Lorrin Jackson Signed: D ([id number]) [id number] Page 12 of 12 Entered By: Lorrin Jackson on 12/07/2021 12:43:20

## 2021-12-11 ENCOUNTER — Telehealth: Payer: Self-pay | Admitting: Physician Assistant

## 2021-12-11 ENCOUNTER — Other Ambulatory Visit: Payer: Self-pay | Admitting: Orthopaedic Surgery

## 2021-12-11 MED ORDER — TIZANIDINE HCL 4 MG PO TABS
ORAL_TABLET | ORAL | 0 refills | Status: DC
Start: 2021-12-11 — End: 2022-01-01

## 2021-12-11 NOTE — Telephone Encounter (Signed)
Please advise 

## 2021-12-11 NOTE — Telephone Encounter (Signed)
Patient called. She would like a refill on tizanidine. Her call back number is [phone]

## 2021-12-14 ENCOUNTER — Encounter (HOSPITAL_BASED_OUTPATIENT_CLINIC_OR_DEPARTMENT_OTHER): Payer: Medicare Other | Admitting: Internal Medicine

## 2021-12-14 ENCOUNTER — Other Ambulatory Visit (HOSPITAL_COMMUNITY): Payer: Self-pay | Admitting: Internal Medicine

## 2021-12-14 DIAGNOSIS — I890 Lymphedema, not elsewhere classified: Secondary | ICD-10-CM | POA: Diagnosis not present

## 2021-12-14 DIAGNOSIS — L97818 Non-pressure chronic ulcer of other part of right lower leg with other specified severity: Secondary | ICD-10-CM | POA: Diagnosis not present

## 2021-12-14 DIAGNOSIS — D631 Anemia in chronic kidney disease: Secondary | ICD-10-CM | POA: Diagnosis not present

## 2021-12-14 DIAGNOSIS — I872 Venous insufficiency (chronic) (peripheral): Secondary | ICD-10-CM | POA: Diagnosis not present

## 2021-12-14 DIAGNOSIS — I509 Heart failure, unspecified: Secondary | ICD-10-CM | POA: Diagnosis not present

## 2021-12-14 DIAGNOSIS — M1990 Unspecified osteoarthritis, unspecified site: Secondary | ICD-10-CM | POA: Diagnosis not present

## 2021-12-14 DIAGNOSIS — I779 Disorder of arteries and arterioles, unspecified: Secondary | ICD-10-CM | POA: Diagnosis not present

## 2021-12-14 DIAGNOSIS — L97828 Non-pressure chronic ulcer of other part of left lower leg with other specified severity: Secondary | ICD-10-CM | POA: Diagnosis not present

## 2021-12-14 DIAGNOSIS — R600 Localized edema: Secondary | ICD-10-CM

## 2021-12-14 DIAGNOSIS — M5416 Radiculopathy, lumbar region: Secondary | ICD-10-CM | POA: Diagnosis not present

## 2021-12-14 DIAGNOSIS — I130 Hypertensive heart and chronic kidney disease with heart failure and stage 1 through stage 4 chronic kidney disease, or unspecified chronic kidney disease: Secondary | ICD-10-CM | POA: Diagnosis not present

## 2021-12-14 DIAGNOSIS — M109 Gout, unspecified: Secondary | ICD-10-CM | POA: Diagnosis not present

## 2021-12-14 DIAGNOSIS — N183 Chronic kidney disease, stage 3 unspecified: Secondary | ICD-10-CM | POA: Diagnosis not present

## 2021-12-15 NOTE — Progress Notes (Signed)
Vanessa Palmer, Vanessa Palmer ([id number]) [id number] Page 1 of 6 Visit Report for 12/14/2021 Arrival Information Details Patient Name: Date of Service: Vanessa, Palmer 12/14/2021 12:30 PM Medical Record Number: [id number] Patient Account Number: [id number] Date of Birth/Sex: Treating RN: [birth year] (71 y.o. [name] Primary Care Mara Favero: Harriet Masson Other Clinician: Referring Davontae Prusinski: Treating Richard Ritchey/Extender: Irineo Axon in Treatment: 5 Visit Information History Since Last Visit Added or deleted any medications: No Patient Arrived: Other Any new allergies or adverse reactions: No Arrival Time: 12:35 Had a fall or experienced change in No Accompanied By: daughter activities of daily living that may affect Transfer Assistance: Manual risk of falls: Patient Identification Verified: Yes Signs or symptoms of abuse/neglect since last visito No Secondary Verification Process Completed: Yes Hospitalized since last visit: No Patient Requires Transmission-Based Precautions: No Implantable device outside of the clinic excluding No Patient Has Alerts: Yes cellular tissue based products placed in the center Patient Alerts: Patient on Blood Thinner since last visit: ABI's= R 1.05 L1.16 Has Dressing in Place as Prescribed: Yes Has Compression in Place as Prescribed: Yes Pain Present Now: No Electronic Signature(s) Signed: 12/14/2021 6:08:05 PM By: Shawn Stall RN, BSN Entered By: Shawn Stall on 12/14/2021 14:25:30 -------------------------------------------------------------------------------- Compression Therapy Details Patient Name: Date of Service: Vanessa Rad D. 12/14/2021 12:30 PM Medical Record Number: [id number] Patient Account Number: [id number] Date of Birth/Sex: Treating RN: [birth year] (71 y.o. [name] Primary Care Pat Elicker: Harriet Masson Other Clinician: Referring Linas Stepter: Treating  Sunita Demond/Extender: Irineo Axon in Treatment: 5 Compression Therapy Performed for Wound Assessment: Wound #6 Right,Posterior Lower Leg Performed By: Clinician Shawn Stall, RN Compression Type: Three Emergency planning/management officer) Signed: 12/14/2021 6:08:05 PM By: Shawn Stall RN, BSN Entered By: Shawn Stall on 12/14/2021 14:26:03 -------------------------------------------------------------------------------- Compression Therapy Details Patient Name: Date of Service: Vanessa Rad D. 12/14/2021 12:30 PM Medical Record Number: [id number] Patient Account Number: [id number] LUN, MURO ([id number]) [id number] Page 2 of 6 Date of Birth/Sex: Treating RN: May 07, 1950 (71 y.o. [name] Primary Care Micky Sheller: Other Clinician: Harriet Masson Referring Brehanna Deveny: Treating Porter Nakama/Extender: Irineo Axon in Treatment: 5 Compression Therapy Performed for Wound Assessment: Wound #9 Left,Anterior Lower Leg Performed By: Clinician Shawn Stall, RN Compression Type: Three Emergency planning/management officer) Signed: 12/14/2021 6:08:05 PM By: Shawn Stall RN, BSN Entered By: Shawn Stall on 12/14/2021 14:26:03 -------------------------------------------------------------------------------- Compression Therapy Details Patient Name: Date of Service: Vanessa Rad D. 12/14/2021 12:30 PM Medical Record Number: [id number] Patient Account Number: [id number] Date of Birth/Sex: Treating RN: 05-27-1950 (71 y.o. [name] Primary Care Milton Streicher: Harriet Masson Other Clinician: Referring Elonzo Sopp: Treating Jamee Pacholski/Extender: Irineo Axon in Treatment: 5 Compression Therapy Performed for Wound Assessment: Wound #8 Left,Posterior Lower Leg Performed By: Clinician Shawn Stall, RN Compression Type: Three Emergency planning/management officer) Signed: 12/14/2021 6:08:05 PM By:  Shawn Stall RN, BSN Entered By: Shawn Stall on 12/14/2021 14:26:03 -------------------------------------------------------------------------------- Encounter Discharge Information Details Patient Name: Date of Service: Vanessa Palmer, [name] 12/14/2021 12:30 PM Medical Record Number: [id number] Patient Account Number: [id number] Date of Birth/Sex: Treating RN: 1950-06-04 (71 y.o. [name], [name] Primary Care Babbie Dondlinger: Harriet Masson Other Clinician: Referring Krystine Pabst: Treating Juanna Pudlo/Extender: Irineo Axon in Treatment: 5 Encounter Discharge Information Items Discharge Condition: Stable Ambulatory Status: Wheelchair Discharge Destination: Home Transportation: Private Auto Accompanied By: daughter Schedule Follow-up Appointment: Yes Clinical Summary of Care: Electronic Signature(s) Signed: 12/14/2021 6:08:05 PM By: Shawn Stall RN, BSN Entered By: Shawn Stall on  12/14/2021 14:26:26 [name], [name] ([id number]) [id number] Page 3 of 6 -------------------------------------------------------------------------------- Wound Assessment Details Patient Name: Date of Service: Vanessa, Palmer 12/14/2021 12:30 PM Medical Record Number: [id number] Patient Account Number: [id number] Date of Birth/Sex: Treating RN: 12-21-1950 (71 y.o. [name], Meta.Reding Primary Care Melik Blancett: Maudry Mayhew Other Clinician: Referring Shawnelle Spoerl: Treating Findley Vi/Extender: Theressa Millard in Treatment: 5 Wound Status Wound Number: 6 Primary Etiology: Lymphedema Wound Location: Right, Posterior Lower Leg Wound Status: Open Wounding Event: Blister Date Acquired: 10/23/2021 Weeks Of Treatment: 5 Clustered Wound: No Wound Measurements Length: (cm) 1.7 Width: (cm) 0.4 Depth: (cm) 0.1 Area: (cm) 0.534 Volume: (cm) 0.053 % Reduction in Area: -240.1% % Reduction in Volume: -231.2% Wound Description Classification:  Full Thickness Without Exposed Suppor Exudate Amount: Medium Exudate Type: Serous Exudate Color: amber t Structures Periwound Skin Texture Texture Color No Abnormalities Noted: No No Abnormalities Noted: No Moisture No Abnormalities Noted: No Treatment Notes Wound #6 (Lower Leg) Wound Laterality: Right, Posterior Cleanser Soap and Water Discharge Instruction: May shower and wash wound with dial antibacterial soap and water prior to dressing change. Wound Cleanser Discharge Instruction: Cleanse the wound with wound cleanser prior to applying a clean dressing using gauze sponges, not tissue or cotton balls. Peri-Wound Care Triamcinolone 15 (g) Discharge Instruction: Use triamcinolone 15 (g) as directed Sween Lotion (Moisturizing lotion) Discharge Instruction: Apply moisturizing lotion as directed Topical Primary Dressing KerraCel Ag Gelling Fiber Dressing, 4x5 in (silver alginate) Discharge Instruction: Apply silver alginate to wound bed as instructed Secondary Dressing Woven Gauze Sponge, Non-Sterile 4x4 in Discharge Instruction: Apply over primary dressing as directed. Secured With Compression Wrap ThreePress (3 layer compression wrap) MAKEYA, HILGERT D ([id number]) [id number].pdf Page 4 of 6 Discharge Instruction: Apply three layer compression as directed. Compression Stockings Add-Ons Electronic Signature(s) Signed: 12/14/2021 6:08:05 PM By: Deon Pilling RN, BSN Entered By: Deon Pilling on 12/14/2021 14:25:50 -------------------------------------------------------------------------------- Wound Assessment Details Patient Name: Date of Service: [name] 12/14/2021 12:30 PM Medical Record Number: [id number] Patient Account Number: [id number] Date of Birth/Sex: Treating RN: 1950/08/18 (71 y.o. [name], Meta.Reding Primary Care Jeriko Kowalke: Maudry Mayhew Other Clinician: Referring Duwayne Matters: Treating Ilana Prezioso/Extender: Theressa Millard in Treatment: 5 Wound Status Wound Number: 8 Primary Etiology: Lymphedema Wound Location: Left, Posterior Lower Leg Wound Status: Open Wounding Event: Gradually Appeared Date Acquired: 12/05/2021 Weeks Of Treatment: 1 Clustered Wound: No Wound Measurements Length: (cm) 0.8 Width: (cm) 2 Depth: (cm) 0.1 Area: (cm) 1.257 Volume: (cm) 0.126 % Reduction in Area: 0% % Reduction in Volume: 0% Wound Description Classification: Full Thickness Without Exposed Suppor Exudate Amount: Medium Exudate Type: Serosanguineous Exudate Color: red, brown t Structures Periwound Skin Texture Texture Color No Abnormalities Noted: No No Abnormalities Noted: No Moisture No Abnormalities Noted: No Treatment Notes Wound #8 (Lower Leg) Wound Laterality: Left, Posterior Cleanser Soap and Water Discharge Instruction: May shower and wash wound with dial antibacterial soap and water prior to dressing change. Wound Cleanser Discharge Instruction: Cleanse the wound with wound cleanser prior to applying a clean dressing using gauze sponges, not tissue or cotton balls. Peri-Wound Care Triamcinolone 15 (g) Discharge Instruction: Use triamcinolone 15 (g) as directed Sween Lotion (Moisturizing lotion) Discharge Instruction: Apply moisturizing lotion as directed Topical ONELL, MCMATH ([id number]) [id number].pdf Page 5 of 6 Primary Dressing KerraCel Ag Gelling Fiber Dressing, 4x5 in (silver alginate) Discharge Instruction: Apply silver alginate to wound bed as instructed Secondary Dressing Woven Gauze Sponge, Non-Sterile 4x4 in Discharge Instruction: Apply over primary dressing as  directed. Secured With Compression Wrap ThreePress (3 layer compression wrap) Discharge Instruction: Apply three layer compression as directed. Compression Stockings Add-Ons Electronic Signature(s) Signed: 12/14/2021 6:08:05 PM By: Shawn Stall RN, BSN Entered  By: Shawn Stall on 12/14/2021 14:25:50 -------------------------------------------------------------------------------- Wound Assessment Details Patient Name: Date of Service: Vanessa Rad D. 12/14/2021 12:30 PM Medical Record Number: [id number] Patient Account Number: [id number] Date of Birth/Sex: Treating RN: Feb 13, 1951 (71 y.o. [name], Millard.Loa Primary Care Disha Cottam: Harriet Masson Other Clinician: Referring Cherylann Hobday: Treating Oline Belk/Extender: Irineo Axon in Treatment: 5 Wound Status Wound Number: 9 Primary Etiology: Lymphedema Wound Location: Left, Anterior Lower Leg Wound Status: Open Wounding Event: Gradually Appeared Date Acquired: 12/05/2021 Weeks Of Treatment: 1 Clustered Wound: No Wound Measurements Length: (cm) 1.5 Width: (cm) 1.1 Depth: (cm) 0.1 Area: (cm) 1.296 Volume: (cm) 0.13 % Reduction in Area: 0% % Reduction in Volume: 0% Wound Description Classification: Full Thickness Without Exposed Suppor Exudate Amount: Medium Exudate Type: Serosanguineous Exudate Color: red, brown t Structures Periwound Skin Texture Texture Color No Abnormalities Noted: No No Abnormalities Noted: No Moisture No Abnormalities Noted: No Treatment Notes Wound #9 (Lower Leg) Wound Laterality: Left, Anterior Cleanser Soap and Water Discharge Instruction: May shower and wash wound with dial antibacterial soap and water prior to dressing change. TENISE, STETLER ([id number]) [id number] Page 6 of 6 Wound Cleanser Discharge Instruction: Cleanse the wound with wound cleanser prior to applying a clean dressing using gauze sponges, not tissue or cotton balls. Peri-Wound Care Triamcinolone 15 (g) Discharge Instruction: Use triamcinolone 15 (g) as directed Sween Lotion (Moisturizing lotion) Discharge Instruction: Apply moisturizing lotion as directed Topical Primary Dressing KerraCel Ag Gelling Fiber Dressing, 4x5 in  (silver alginate) Discharge Instruction: Apply silver alginate to wound bed as instructed Secondary Dressing Woven Gauze Sponge, Non-Sterile 4x4 in Discharge Instruction: Apply over primary dressing as directed. Secured With Compression Wrap ThreePress (3 layer compression wrap) Discharge Instruction: Apply three layer compression as directed. Compression Stockings Add-Ons Electronic Signature(s) Signed: 12/14/2021 6:08:05 PM By: Shawn Stall RN, BSN Entered By: Shawn Stall on 12/14/2021 14:25:50

## 2021-12-15 NOTE — Progress Notes (Signed)
RENLEE, FLOOR ([id number]) [id number] Page 1 of 1 Visit Report for 12/14/2021 SuperBill Details Patient Name: Date of Service: AMERICA, SANDALL 12/14/2021 Medical Record Number: [id number] Patient Account Number: [id number] Date of Birth/Sex: Treating RN: Sep 24, 1950 (71 y.o. [name], Meta.Reding Primary Care Provider: Maudry Mayhew Other Clinician: Referring Provider: Treating Provider/Extender: Theressa Millard in Treatment: 5 Diagnosis Coding ICD-10 Codes Code Description I89.0 Lymphedema, not elsewhere classified L97.818 Non-pressure chronic ulcer of other part of right lower leg with other specified severity L97.828 Non-pressure chronic ulcer of other part of left lower leg with other specified severity I87.303 Chronic venous hypertension (idiopathic) without complications of bilateral lower extremity Facility Procedures CPT4 Description Modifier Quantity Code [id number] BILATERAL: Application of multi-layer venous compression system; leg (below knee), including ankle and 1 foot. Electronic Signature(s) Signed: 12/14/2021 4:16:28 PM By: Kalman Shan DO Signed: 12/14/2021 6:08:05 PM By: Deon Pilling RN, BSN Entered By: Deon Pilling on 12/14/2021 14:26:31

## 2021-12-18 ENCOUNTER — Other Ambulatory Visit (HOSPITAL_COMMUNITY): Payer: Self-pay | Admitting: Internal Medicine

## 2021-12-18 ENCOUNTER — Ambulatory Visit (HOSPITAL_COMMUNITY)
Admission: RE | Admit: 2021-12-18 | Discharge: 2021-12-18 | Disposition: A | Discharge status: Home / Self Care | Payer: Medicare Other | Source: Ambulatory Visit | Attending: Cardiology | Admitting: Cardiology

## 2021-12-18 DIAGNOSIS — R600 Localized edema: Secondary | ICD-10-CM | POA: Insufficient documentation

## 2021-12-21 ENCOUNTER — Encounter (HOSPITAL_BASED_OUTPATIENT_CLINIC_OR_DEPARTMENT_OTHER): Payer: Medicare Other | Admitting: Internal Medicine

## 2021-12-21 DIAGNOSIS — I890 Lymphedema, not elsewhere classified: Secondary | ICD-10-CM | POA: Diagnosis not present

## 2021-12-21 DIAGNOSIS — L97818 Non-pressure chronic ulcer of other part of right lower leg with other specified severity: Secondary | ICD-10-CM | POA: Diagnosis not present

## 2021-12-21 DIAGNOSIS — D631 Anemia in chronic kidney disease: Secondary | ICD-10-CM | POA: Diagnosis not present

## 2021-12-21 DIAGNOSIS — I87303 Chronic venous hypertension (idiopathic) without complications of bilateral lower extremity: Secondary | ICD-10-CM

## 2021-12-21 DIAGNOSIS — L97828 Non-pressure chronic ulcer of other part of left lower leg with other specified severity: Secondary | ICD-10-CM

## 2021-12-21 DIAGNOSIS — N183 Chronic kidney disease, stage 3 unspecified: Secondary | ICD-10-CM | POA: Diagnosis not present

## 2021-12-21 DIAGNOSIS — M5416 Radiculopathy, lumbar region: Secondary | ICD-10-CM | POA: Diagnosis not present

## 2021-12-21 DIAGNOSIS — I872 Venous insufficiency (chronic) (peripheral): Secondary | ICD-10-CM | POA: Diagnosis not present

## 2021-12-21 DIAGNOSIS — I509 Heart failure, unspecified: Secondary | ICD-10-CM | POA: Diagnosis not present

## 2021-12-21 DIAGNOSIS — I779 Disorder of arteries and arterioles, unspecified: Secondary | ICD-10-CM | POA: Diagnosis not present

## 2021-12-21 DIAGNOSIS — M1990 Unspecified osteoarthritis, unspecified site: Secondary | ICD-10-CM | POA: Diagnosis not present

## 2021-12-21 DIAGNOSIS — I130 Hypertensive heart and chronic kidney disease with heart failure and stage 1 through stage 4 chronic kidney disease, or unspecified chronic kidney disease: Secondary | ICD-10-CM | POA: Diagnosis not present

## 2021-12-21 DIAGNOSIS — M109 Gout, unspecified: Secondary | ICD-10-CM | POA: Diagnosis not present

## 2021-12-21 NOTE — Progress Notes (Signed)
KOREENA, JOOST ([id number]) [id number] Page 1 of 11 Visit Report for 12/21/2021 Arrival Information Details Patient Name: Date of Service: CAROLANN, BRAZELL 12/21/2021 2:45 PM Medical Record Number: [id number] Patient Account Number: [id number] Date of Birth/Sex: Treating RN: 08-22-1950 (71 y.o. F) Primary Care Alvera Tourigny: Maudry Mayhew Other Clinician: Referring Karma Hiney: Treating Vayla Wilhelmi/Extender: Theressa Millard in Treatment: 6 Visit Information History Since Last Visit Added or deleted any medications: No Patient Arrived: Wheel Chair Any new allergies or adverse reactions: No Arrival Time: 14:49 Had a fall or experienced change in No Accompanied By: self activities of daily living that may affect Transfer Assistance: Manual risk of falls: Patient Identification Verified: Yes Signs or symptoms of abuse/neglect since last visito No Secondary Verification Process Completed: Yes Hospitalized since last visit: No Patient Requires Transmission-Based Precautions: No Implantable device outside of the clinic excluding No Patient Has Alerts: Yes cellular tissue based products placed in the center Patient Alerts: Patient on Blood Thinner since last visit: ABI's= R 1.05 L1.16 Has Dressing in Place as Prescribed: Yes Pain Present Now: Yes Electronic Signature(s) Signed: 12/21/2021 4:54:57 PM By: Erenest Blank Entered By: Erenest Blank on 12/21/2021 14:50:16 -------------------------------------------------------------------------------- Compression Therapy Details Patient Name: Date of Service: [name] 12/21/2021 2:45 PM Medical Record Number: [id number] Patient Account Number: [id number] Date of Birth/Sex: Treating RN: 07-15-1950 (71 y.o. [name] Phoenix, Lauren Primary Care Jeanpaul Biehl: Maudry Mayhew Other Clinician: Referring Lissandro Dilorenzo: Treating Brieanne Mignone/Extender: Theressa Millard in  Treatment: 6 Compression Therapy Performed for Wound Assessment: Wound #6 Right,Posterior Lower Leg Performed By: Clinician Rhae Hammock, RN Compression Type: Three Layer Post Procedure Diagnosis Same as Pre-procedure Electronic Signature(s) Signed: 12/21/2021 4:44:37 PM By: Rhae Hammock RN Entered By: Rhae Hammock on 12/21/2021 16:35:35 Letizia, Oceana D ([id number]) [id number] Page 2 of 11 -------------------------------------------------------------------------------- Compression Therapy Details Patient Name: Date of Service: GIAVANNA, KANG 12/21/2021 2:45 PM Medical Record Number: [id number] Patient Account Number: [id number] Date of Birth/Sex: Treating RN: 1950/09/03 (71 y.o. [name] Phoenix, Lauren Primary Care Jewelz Kobus: Maudry Mayhew Other Clinician: Referring Vergene Marland: Treating Marlyn Rabine/Extender: Theressa Millard in Treatment: 6 Compression Therapy Performed for Wound Assessment: Wound #8 Left,Posterior Lower Leg Performed By: Clinician Rhae Hammock, RN Compression Type: Three Layer Post Procedure Diagnosis Same as Pre-procedure Electronic Signature(s) Signed: 12/21/2021 4:44:37 PM By: Rhae Hammock RN Entered By: Rhae Hammock on 12/21/2021 16:35:35 -------------------------------------------------------------------------------- Compression Therapy Details Patient Name: Date of Service: [name] 12/21/2021 2:45 PM Medical Record Number: [id number] Patient Account Number: [id number] Date of Birth/Sex: Treating RN: 04-28-1950 (71 y.o. [name] Phoenix, Lauren Primary Care Soila Printup: Maudry Mayhew Other Clinician: Referring Raydell Maners: Treating Micaiah Remillard/Extender: Theressa Millard in Treatment: 6 Compression Therapy Performed for Wound Assessment: Wound #9 Left,Anterior Lower Leg Performed By: Clinician Rhae Hammock, RN Compression Type: Three Layer Post  Procedure Diagnosis Same as Pre-procedure Electronic Signature(s) Signed: 12/21/2021 4:44:37 PM By: Rhae Hammock RN Entered By: Rhae Hammock on 12/21/2021 16:35:35 -------------------------------------------------------------------------------- Encounter Discharge Information Details Patient Name: Date of Service: [name] 12/21/2021 2:45 PM Medical Record Number: [id number] Patient Account Number: [id number] Date of Birth/Sex: Treating RN: [birth year] (71 y.o. [name] Phoenix, Lauren Primary Care Righteous Claiborne: Maudry Mayhew Other Clinician: Referring Dante Cooter: Treating Laurette Villescas/Extender: Theressa Millard in Treatment: 6 Encounter Discharge Information Items Discharge Condition: Stable Ambulatory Status: Ambulatory Discharge Destination: Home Transportation: Private Auto Accompanied By: self Schedule Follow-up Appointment: Yes Clinical Summary of Care: Patient TEKIA, [place]Y ([id number]) [id number] Page 3 of 11  Electronic Signature(s) Signed: 12/21/2021 4:44:37 PM By: Rhae Hammock RN Entered By: Rhae Hammock on 12/21/2021 16:36:18 -------------------------------------------------------------------------------- Lower Extremity Assessment Details Patient Name: Date of Service: [name] 12/21/2021 2:45 PM Medical Record Number: [id number] Patient Account Number: [id number] Date of Birth/Sex: Treating RN: 10/22/1950 (71 y.o. F) Primary Care Ahuva Poynor: Maudry Mayhew Other Clinician: Referring Dave Mannes: Treating Cote Mayabb/Extender: Theressa Millard in Treatment: 6 Edema Assessment Assessed: [Left: No] [Right: No] Edema: [Left: Yes] [Right: Yes] Calf Left: Right: Point of Measurement: 28 cm From Medial Instep 53.3 cm 57.6 cm Ankle Left: Right: Point of Measurement: 9 cm From Medial Instep 29.5 cm 29 cm Electronic Signature(s) Signed: 12/21/2021 4:54:57 PM  By: Erenest Blank Entered By: Erenest Blank on 12/21/2021 15:05:27 -------------------------------------------------------------------------------- Multi Wound Chart Details Patient Name: Date of Service: [name] 12/21/2021 2:45 PM Medical Record Number: [id number] Patient Account Number: [id number] Date of Birth/Sex: Treating RN: November 15, 1950 (71 y.o. F) Primary Care Emmersyn Kratzke: Maudry Mayhew Other Clinician: Referring Roselynne Lortz: Treating Alisse Tuite/Extender: Theressa Millard in Treatment: 6 Vital Signs Height(in): Pulse(bpm): 69 Weight(lbs): Blood Pressure(mmHg): 161/86 Body Mass Index(BMI): Temperature(F): 97.6 Respiratory Rate(breaths/min): 20 [6:Photos:] [[id number] Page 4 of 11] Right, Posterior Lower Leg Left, Posterior Lower Leg Left, Anterior Lower Leg Wound Location: Blister Gradually Appeared Gradually Appeared Wounding Event: Lymphedema Lymphedema Lymphedema Primary Etiology: Anemia, Asthma, Sleep Apnea, Anemia, Asthma, Sleep Apnea, Anemia, Asthma, Sleep Apnea, Comorbid History: Congestive Heart Failure, Coronary Congestive Heart Failure, Coronary Congestive Heart Failure, Coronary Artery Disease, Deep Vein Artery Disease, Deep Vein Artery Disease, Deep Vein Thrombosis, Hypertension, Myocardial Thrombosis, Hypertension, Myocardial Thrombosis, Hypertension, Myocardial Infarction, Gout, Osteoarthritis, SeizureInfarction, Gout, Osteoarthritis, SeizureInfarction, Gout, Osteoarthritis, Seizure Disorder Disorder Disorder 10/23/2021 12/05/2021 12/05/2021 Date Acquired: _0 Weeks of Treatment: Open Open Open Wound Status: No No No Wound Recurrence: 7x6.5x0.1 0.2x0.5x0.1 1x1.1x0.1 Measurements L x W x D (cm) 35.736 0.079 0.864 A (cm) : rea 3.574 0.008 0.086 Volume (cm) : -22661.80% 93.70% 33.30% % Reduction in Area: -22237.50% 93.70% 33.80% % Reduction in Volume: Full Thickness Without Exposed  Full Thickness Without Exposed Full Thickness Without Exposed Classification: Support Structures Support Structures Support Structures Medium Medium Medium Exudate Amount: Serous Serosanguineous Serosanguineous Exudate Type: amber red, brown red, brown Exudate Color: Large (67-100%) Large (67-100%) N/A Granulation Amount: Red Pink N/A Granulation Quality: None Present (0%) N/A N/A Necrotic Amount: Fascia: No Fascia: No N/A Exposed Structures: Fat Layer (Subcutaneous Tissue): No Fat Layer (Subcutaneous Tissue): No Tendon: No Tendon: No Muscle: No Muscle: No Joint: No Joint: No Bone: No Bone: No N/A None None Epithelialization: Excoriation: No Excoriation: No Excoriation: No Periwound Skin Texture: Induration: No Induration: No Induration: No Callus: No Callus: No Callus: No Crepitus: No Crepitus: No Crepitus: No Rash: No Rash: No Rash: No Scarring: No Scarring: No Scarring: No Maceration: No Maceration: No Maceration: Yes Periwound Skin Moisture: Dry/Scaly: No Dry/Scaly: No Dry/Scaly: No Atrophie Blanche: No Atrophie Blanche: No Atrophie Blanche: No Periwound Skin Color: Cyanosis: No Cyanosis: No Cyanosis: No Ecchymosis: No Ecchymosis: No Ecchymosis: No Erythema: No Erythema: No Erythema: No Hemosiderin Staining: No Hemosiderin Staining: No Hemosiderin Staining: No Mottled: No Mottled: No Mottled: No Pallor: No Pallor: No Pallor: No Rubor: No Rubor: No Rubor: No Treatment Notes Electronic Signature(s) Signed: 12/21/2021 4:37:04 PM By: Kalman Shan DO Entered By: Kalman Shan on 12/21/2021 15:37:39 -------------------------------------------------------------------------------- Multi-Disciplinary Care Plan Details Patient Name: Date of Service: [name] 12/21/2021 2:45 PM Medical Record Number: [id number] Patient Account Number: [id number] Date of Birth/Sex:  Treating RN: Oct 12, 1950 (71 y.o. F) Primary Care  [place] Blizzard: Maudry Mayhew Other Clinician: Referring Jerritt Cardoza: Treating Carnita Golob/Extender: Theressa Millard in Treatment: 6 Active Inactive Venous Leg Ulcer Nursing Diagnoses: KEMESHA, MOSEY ([id number]) [id number] Page 5 of 11 Actual venous Insuffiency (use after diagnosis is confirmed) Goals: Patient will maintain optimal edema control Date Initiated: 11/09/2021 Target Resolution Date: 01/04/2022 Goal Status: Active Interventions: Assess peripheral edema status every visit. Compression as ordered Provide education on venous insufficiency Treatment Activities: Therapeutic compression applied : 11/09/2021 Notes: Wound/Skin Impairment Nursing Diagnoses: Impaired tissue integrity Goals: Patient/caregiver will verbalize understanding of skin care regimen Date Initiated: 11/09/2021 Target Resolution Date: 01/04/2022 Goal Status: Active Ulcer/skin breakdown will have a volume reduction of 30% by week 4 Date Initiated: 11/09/2021 Date Inactivated: 12/07/2021 Target Resolution Date: 12/06/2021 Goal Status: Met Interventions: Assess patient/caregiver ability to obtain necessary supplies Assess patient/caregiver ability to perform ulcer/skin care regimen upon admission and as needed Assess ulceration(s) every visit Provide education on ulcer and skin care Treatment Activities: Topical wound management initiated : 11/09/2021 Notes: Electronic Signature(s) Signed: 12/21/2021 4:54:57 PM By: Erenest Blank Entered By: Erenest Blank on 12/21/2021 15:18:38 -------------------------------------------------------------------------------- Pain Assessment Details Patient Name: Date of Service: [name] 12/21/2021 2:45 PM Medical Record Number: [id number] Patient Account Number: [id number] Date of Birth/Sex: Treating RN: 1950/03/07 (71 y.o. F) Primary Care Junita Kubota: Maudry Mayhew Other Clinician: Referring Shakeia Krus: Treating  Vaani Morren/Extender: Theressa Millard in Treatment: 6 Active Problems Location of Pain Severity and Description of Pain Patient Has Paino Yes Site Locations Pain LocationJACQUELINE, SPOFFORD ([id number]) [id number] Page 6 of 11 Pain Location: Pain in Ulcers Rate the pain. Current Pain Level: 8 Pain Management and Medication Current Pain Management: Electronic Signature(s) Signed: 12/21/2021 4:54:57 PM By: Erenest Blank Entered By: Erenest Blank on 12/21/2021 14:50:32 -------------------------------------------------------------------------------- Patient/Caregiver Education Details Patient Name: Date of Service: [name] 10/26/2023andnbsp2:45 PM Medical Record Number: [id number] Patient Account Number: [id number] Date of Birth/Gender: Treating RN: 1950/09/13 (71 y.o. F) Primary Care Physician: Maudry Mayhew Other Clinician: Referring Physician: Treating Physician/Extender: Theressa Millard in Treatment: 6 Education Assessment Education Provided To: Patient Education Topics Provided Venous: Methods: Explain/Verbal Responses: Reinforcements needed, State content correctly Wound/Skin Impairment: Methods: Explain/Verbal Responses: Reinforcements needed, State content correctly Electronic Signature(s) Signed: 12/21/2021 4:54:57 PM By: Erenest Blank Entered By: Erenest Blank on 12/21/2021 15:18:55 -------------------------------------------------------------------------------- Wound Assessment Details Patient Name: Date of Service: [name] 12/21/2021 2:45 PM Maple Hudson D ([id number]) [id number] Page 7 of 11 Medical Record Number: [id number] Patient Account Number: [id number] Date of Birth/Sex: Treating RN: 1950-09-29 (71 y.o. F) Primary Care Isak Sotomayor: Maudry Mayhew Other Clinician: Referring Leanard Dimaio: Treating Ilyanna Baillargeon/Extender: Theressa Millard in Treatment: 6 Wound Status Wound Number: 6 Primary Lymphedema Etiology: Wound Location: Right, Posterior Lower Leg Wound Open Wounding Event: Blister Status: Date Acquired: 10/23/2021 Comorbid Anemia, Asthma, Sleep Apnea, Congestive Heart Failure, Coronary Weeks Of Treatment: 6 History: Artery Disease, Deep Vein Thrombosis, Hypertension, Myocardial Clustered Wound: No Infarction, Gout, Osteoarthritis, Seizure Disorder Photos Wound Measurements Length: (cm) 7 Width: (cm) 6.5 Depth: (cm) 0.1 Area: (cm) 35.736 Volume: (cm) 3.574 % Reduction in Area: -22661.8% % Reduction in Volume: -22237.5% Tunneling: No Undermining: No Wound Description Classification: Full Thickness Without Exposed Suppor Exudate Amount: Medium Exudate Type: Serous Exudate Color: amber t Structures Wound Bed Granulation Amount: Large (67-100%) Exposed Structure Granulation Quality: Red Fascia Exposed: No Necrotic Amount: None Present (0%) Fat Layer (Subcutaneous Tissue) Exposed: No Tendon Exposed:  No Muscle Exposed: No Joint Exposed: No Bone Exposed: No Periwound Skin Texture Texture Color No Abnormalities Noted: No No Abnormalities Noted: No Callus: No Atrophie Blanche: No Crepitus: No Cyanosis: No Excoriation: No Ecchymosis: No Induration: No Erythema: No Rash: No Hemosiderin Staining: No Scarring: No Mottled: No Pallor: No Moisture Rubor: No No Abnormalities Noted: No Dry / Scaly: No Maceration: No Treatment Notes Wound #6 (Lower Leg) Wound Laterality: Right, Posterior Cleanser Soap and Water Discharge Instruction: May shower and wash wound with dial antibacterial soap and water prior to dressing change. Wound Cleanser Discharge Instruction: Cleanse the wound with wound cleanser prior to applying a clean dressing using gauze sponges, not tissue or cotton balls. JELIYAH, MIDDLEBROOKS ([id number]) [id number] Page 8 of  11 Peri-Wound Care Triamcinolone 15 (g) Discharge Instruction: Use triamcinolone 15 (g) as directed Sween Lotion (Moisturizing lotion) Discharge Instruction: Apply moisturizing lotion as directed Topical Primary Dressing KerraCel Ag Gelling Fiber Dressing, 4x5 in (silver alginate) Discharge Instruction: Apply silver alginate to wound bed as instructed Secondary Dressing Woven Gauze Sponge, Non-Sterile 4x4 in Discharge Instruction: Apply over primary dressing as directed. Secured With Compression Wrap ThreePress (3 layer compression wrap) Discharge Instruction: Apply three layer compression as directed. Compression Stockings Add-Ons Electronic Signature(s) Signed: 12/21/2021 4:54:57 PM By: Erenest Blank Entered By: Erenest Blank on 12/21/2021 15:06:31 -------------------------------------------------------------------------------- Wound Assessment Details Patient Name: Date of Service: [name] 12/21/2021 2:45 PM Medical Record Number: [id number] Patient Account Number: [id number] Date of Birth/Sex: Treating RN: Dec 31, 1950 (71 y.o. F) Primary Care : Maudry Mayhew Other Clinician: Referring : Treating /Extender: Theressa Millard in Treatment: 6 Wound Status Wound Number: 8 Primary Lymphedema Etiology: Wound Location: Left, Posterior Lower Leg Wound Open Wounding Event: Gradually Appeared Status: Date Acquired: 12/05/2021 Comorbid Anemia, Asthma, Sleep Apnea, Congestive Heart Failure, Coronary Weeks Of Treatment: 2 History: Artery Disease, Deep Vein Thrombosis, Hypertension, Myocardial Clustered Wound: No Infarction, Gout, Osteoarthritis, Seizure Disorder Photos Wound Measurements Length: (cm) 0.2 Width: (cm) 0.5 Depth: (cm) 0.1 Bankson, Malak D ([id number]) Area: (cm) 0.079 Volume: (cm) 0.008 % Reduction in Area: 93.7% % Reduction in Volume: 93.7% Epithelialization:  None [id number] Page 9 of 11 Tunneling: No Undermining: No Wound Description Classification: Full Thickness Without Exposed Su Exudate Amount: Medium Exudate Type: Serosanguineous Exudate Color: red, brown pport Structures Wound Bed Granulation Amount: Large (67-100%) Exposed Structure Granulation Quality: Pink Fascia Exposed: No Fat Layer (Subcutaneous Tissue) Exposed: No Tendon Exposed: No Muscle Exposed: No Joint Exposed: No Bone Exposed: No Periwound Skin Texture Texture Color No Abnormalities Noted: No No Abnormalities Noted: No Callus: No Atrophie Blanche: No Crepitus: No Cyanosis: No Excoriation: No Ecchymosis: No Induration: No Erythema: No Rash: No Hemosiderin Staining: No Scarring: No Mottled: No Pallor: No Moisture Rubor: No No Abnormalities Noted: No Dry / Scaly: No Maceration: No Treatment Notes Wound #8 (Lower Leg) Wound Laterality: Left, Posterior Cleanser Soap and Water Discharge Instruction: May shower and wash wound with dial antibacterial soap and water prior to dressing change. Wound Cleanser Discharge Instruction: Cleanse the wound with wound cleanser prior to applying a clean dressing using gauze sponges, not tissue or cotton balls. Peri-Wound Care Triamcinolone 15 (g) Discharge Instruction: Use triamcinolone 15 (g) as directed Sween Lotion (Moisturizing lotion) Discharge Instruction: Apply moisturizing lotion as directed Topical Primary Dressing KerraCel Ag Gelling Fiber Dressing, 4x5 in (silver alginate) Discharge Instruction: Apply silver alginate to wound bed as instructed Secondary Dressing Woven Gauze Sponge, Non-Sterile 4x4 in Discharge Instruction: Apply over primary dressing as directed. Secured  With Compression Wrap ThreePress (3 layer compression wrap) Discharge Instruction: Apply three layer compression as directed. Compression Stockings Add-Ons Electronic Signature(s) Signed: 12/21/2021  4:54:57 PM By: Erenest Blank Entered By: Erenest Blank on 12/21/2021 15:07:20 Maple Hudson D ([id number]) [id number] Page 10 of 11 -------------------------------------------------------------------------------- Wound Assessment Details Patient Name: Date of Service: WILDA, WETHERELL 12/21/2021 2:45 PM Medical Record Number: [id number] Patient Account Number: [id number] Date of Birth/Sex: Treating RN: 1950-10-19 (71 y.o. F) Primary Care Devun Anna: Maudry Mayhew Other Clinician: Referring Carlon Chaloux: Treating Blakeleigh Domek/Extender: Theressa Millard in Treatment: 6 Wound Status Wound Number: 9 Primary Lymphedema Etiology: Wound Location: Left, Anterior Lower Leg Wound Open Wounding Event: Gradually Appeared Status: Date Acquired: 12/05/2021 Comorbid Anemia, Asthma, Sleep Apnea, Congestive Heart Failure, Coronary Weeks Of Treatment: 2 History: Artery Disease, Deep Vein Thrombosis, Hypertension, Myocardial Clustered Wound: No Infarction, Gout, Osteoarthritis, Seizure Disorder Photos Wound Measurements Length: (cm) 1 Width: (cm) 1.1 Depth: (cm) 0.1 Area: (cm) 0.864 Volume: (cm) 0.086 % Reduction in Area: 33.3% % Reduction in Volume: 33.8% Epithelialization: None Tunneling: No Undermining: No Wound Description Classification: Full Thickness Without Exposed Suppor Exudate Amount: Medium Exudate Type: Serosanguineous Exudate Color: red, brown t Structures Wound Bed Necrotic Amount: Medium (34-66%) Periwound Skin Texture Texture Color No Abnormalities Noted: No No Abnormalities Noted: No Callus: No Atrophie Blanche: No Crepitus: No Cyanosis: No Excoriation: No Ecchymosis: No Induration: No Erythema: No Rash: No Hemosiderin Staining: No Scarring: No Mottled: No Pallor: No Moisture Rubor: No No Abnormalities Noted: No Dry / Scaly: No Maceration: Yes Treatment Notes Wound #9 (Lower Leg) Wound Laterality:  Left, Anterior Cleanser Soap and [address] GRACI, HULCE ([id number]) [id number] Page 11 of 11 Discharge Instruction: May shower and wash wound with dial antibacterial soap and water prior to dressing change. Wound Cleanser Discharge Instruction: Cleanse the wound with wound cleanser prior to applying a clean dressing using gauze sponges, not tissue or cotton balls. Peri-Wound Care Triamcinolone 15 (g) Discharge Instruction: Use triamcinolone 15 (g) as directed Sween Lotion (Moisturizing lotion) Discharge Instruction: Apply moisturizing lotion as directed Topical Primary Dressing KerraCel Ag Gelling Fiber Dressing, 4x5 in (silver alginate) Discharge Instruction: Apply silver alginate to wound bed as instructed Secondary Dressing Woven Gauze Sponge, Non-Sterile 4x4 in Discharge Instruction: Apply over primary dressing as directed. Secured With Compression Wrap ThreePress (3 layer compression wrap) Discharge Instruction: Apply three layer compression as directed. Compression Stockings Add-Ons Electronic Signature(s) Signed: 12/21/2021 4:54:57 PM By: Erenest Blank Entered By: Erenest Blank on 12/21/2021 15:08:14 -------------------------------------------------------------------------------- Vitals Details Patient Name: Date of Service: Betsey Holiday, Lavonne Chick D. 12/21/2021 2:45 PM Medical Record Number: [id number] Patient Account Number: [id number] Date of Birth/Sex: Treating RN: [birth year] (71 y.o. F) Primary Care Dianna Ewald: Maudry Mayhew Other Clinician: Referring Tavarious Freel: Treating Kena Limon/Extender: Theressa Millard in Treatment: 6 Vital Signs Time Taken: 14:50 Temperature (F): 97.6 Pulse (bpm): 69 Respiratory Rate (breaths/min): 20 Blood Pressure (mmHg): 161/86 Reference Range: 80 - 120 mg / dl Electronic Signature(s) Signed: 12/21/2021 4:54:57 PM By: Erenest Blank Entered By: Erenest Blank on 12/21/2021 14:52:54

## 2021-12-21 NOTE — Progress Notes (Signed)
Vanessa Palmer, Vanessa Palmer ([id number]) [id number] Page 1 of 9 Visit Report for 12/21/2021 Chief Complaint Document Details Patient Name: Date of Service: Vanessa Palmer, Vanessa Palmer 12/21/2021 2:45 PM Medical Record Number: [id number] Patient Account Number: [id number] Date of Birth/Sex: Treating RN: March 20, 1950 (71 y.o. F) Primary Care Provider: Maudry Mayhew Other Clinician: Referring Provider: Treating Provider/Extender: Theressa Millard in Treatment: 6 Information Obtained from: Patient Chief Complaint 09/07/2021; scattered open wounds to the lower extremities bilaterally 11/09/21; Patient returns to clinic with recurrent small weeping areas on her bilateral lower legs Electronic Signature(s) Signed: 12/21/2021 4:37:04 PM By: Kalman Shan DO Entered By: Kalman Shan on 12/21/2021 15:37:47 -------------------------------------------------------------------------------- HPI Details Patient Name: Date of Service: Vanessa Palmer, Vanessa Chick Palmer. 12/21/2021 2:45 PM Medical Record Number: [id number] Patient Account Number: [id number] Date of Birth/Sex: Treating RN: 01-Aug-1950 (71 y.o. F) Primary Care Provider: Maudry Mayhew Other Clinician: Referring Provider: Treating Provider/Extender: Theressa Millard in Treatment: 6 History of Present Illness HPI Description: Admission 09/07/2021 Ms. Sion Braziel is a 71 year old female with a past medical history of essential hypertension, chronic kidney disease stage III, and lymphedema that presents to the clinic for a 2-week history of scattered open wounds to her lower extremities bilaterally. She states this has been an ongoing issue for the past year. She states that the wounds wax and wane in healing. She currently takes torsemide. She does not wear compression stockings. She currently denies signs of infection. 7/20; patient presents for follow-up. She received 1 juxta lite  compression wrap in the mail. She was supposed to receive 2. She states she talked to the company and they are sending her her second pair. We used antibiotic ointment and silver alginate under 3 layer compression at last clinic visit. The silver alginate stuck to the wound bed. The left lower extremity wounds have healed. 7/27; patient presents for follow-up. She still has not received her juxta lite compression for the right leg. We spoke with the wound care supply company today and they stated it will arrive today. We have been using Xeroform under 3 layer compression to the right lower extremity. She has been using the juxta lite compression to the left lower extremity. Her wounds remain healed to the left lower extremity. READMISSION 11/09/2021 This is a patient that we had in clinic in July. She has lymphedema secondary to chronic venous insufficiency and had scattered open areas on her bilateral lower extremities she received 3 layer compression bilaterally the area is closed over and she was prescribed juxta lite stockings. She tells me she was compliant with the juxta lites, apparently she has home health aides to help her put these on. About 3 weeks ago she noted increase in swelling and weeping areas in her bilateral lower legs and she is in for review of this. She does not have arterial issues her ABIs in the clinic last time were quite normal bilaterally. She does have chronic kidney disease stage III and lumbar radiculopathy. 9/21; patient presents for follow-up. She presented last week with open wounds to her legs bilaterally. Silver alginate under 3 layer compression was used. Her left lower extremity wounds have healed. She has a juxta light compression at home. She still has open wounds on the right lower extremity. She denies signs of infection. She tolerated the compression wraps well. 9/28; as I understand things we actually healed out her left leg last week and transitioned her  to juxta lites however almost immediately fluid ripped out of Kirn, Sawsan  Palmer ([id number]) [id number] Page 2 of 9 several open areas on the left leg. She comes in today with significant bilateral lower extremity edema. 10/5; patient presents for follow-up. Her left leg has healed again. She states she has her juxta lite compression wraps at home. We have been using silver alginate with 3 layer compression to the right lower extremity. She has no issues or complaints today. 10/12; patient presents for follow-up. We will wrapped her right leg with silver alginate under 3 layer compression at last clinic visit. Her left lower extremity wounds had healed and she was using her juxta lite compression wraps. Unfortunately she has reopened wounds to this leg. 10/26; patient presents for follow-up. She has been without compression wraps since Monday. She went for her venous reflux studies however could not tolerate the ultrasound probe pressing against her leg. They were able to state that there was no DVT We have been using silver alginate under 3 layer . compression. Electronic Signature(s) Signed: 12/21/2021 4:37:04 PM By: Geralyn CorwinHoffman, Eriyana Sweeten DO Entered By: Geralyn CorwinHoffman, Halden Phegley on 12/21/2021 15:38:49 -------------------------------------------------------------------------------- Physical Exam Details Patient Name: Date of Service: Vanessa Palmer, Vanessa Palmer. 12/21/2021 2:45 PM Medical Record Number: [id number] Patient Account Number: [id number] Date of Birth/Sex: Treating RN: [birth year](71 y.o. F) Primary Care Provider: Harriet MassonMaldonado, Edgar Other Clinician: Referring Provider: Treating Provider/Extender: Irineo AxonHoffman, Francena Zender Maldonado, Edgar Weeks in Treatment: 6 Constitutional respirations regular, non-labored and within target range for patient.. Cardiovascular 2+ dorsalis pedis/posterior tibialis pulses. Psychiatric pleasant and cooperative. Notes Wounds to the lower extremities  bilaterally with granulation tissue. 2+ pitting edema to the knee. No signs of surrounding infection. Electronic Signature(s) Signed: 12/21/2021 4:37:04 PM By: Geralyn CorwinHoffman, Dalene Robards DO Entered By: Geralyn CorwinHoffman, Champ Keetch on 12/21/2021 15:39:51 -------------------------------------------------------------------------------- Physician Orders Details Patient Name: Date of Service: Vanessa Palmer, Vanessa Palmer. 12/21/2021 2:45 PM Medical Record Number: [id number] Patient Account Number: [id number] Date of Birth/Sex: Treating RN: [birth year] [age] y.o. F) Primary Care Provider: Harriet MassonMaldonado, Edgar Other Clinician: Referring Provider: Treating Provider/Extender: Irineo AxonHoffman, Levette Paulick Maldonado, Edgar Weeks in Treatment: 6 Verbal / Phone Orders: No Diagnosis Coding Follow-up Appointments Return appointment in 3 weeks. - on Monday w/ Dr. Mikey BussingHoffman Nurse Visit: - this coming Monday and the Monday after (12/25/21 and 01/01/22) Anesthetic (In clinic) Topical Lidocaine 5% applied to wound bed (In clinic) Topical Lidocaine 4% applied to wound bed Vanessa GaultSIMMONS, Vanessa Palmer ([id number]) [id number] Page 3 of 9 Bathing/ Shower/ Hygiene May shower with protection but do not get wound dressing(s) wet. - Can use cast protector bag from [facility], [facility] or [employer] Edema Control - Lymphedema / SCD / Other Elevate legs to the level of the heart or above for 30 minutes daily and/or when sitting, a frequency of: - throughout the day Avoid standing for long periods of time. Other Edema Control Orders/Instructions: - We will try and see about ordering lymphedema pumps after you've had at least 4 weeks of wearing compression wraps. Wound Treatment Wound #6 - Lower Leg Wound Laterality: Right, Posterior Cleanser: Soap and Water 1 x Per Week/30 Days Discharge Instructions: May shower and wash wound with dial antibacterial soap and water prior to dressing change. Cleanser: Wound Cleanser 1 x Per Week/30 Days Discharge  Instructions: Cleanse the wound with wound cleanser prior to applying a clean dressing using gauze sponges, not tissue or cotton balls. Peri-Wound Care: Triamcinolone 15 (g) 1 x Per Week/30 Days Discharge Instructions: Use triamcinolone 15 (g) as directed Peri-Wound Care: Sween Lotion (Moisturizing lotion) 1 x Per Week/30 Days Discharge Instructions: Apply moisturizing lotion as directed  Prim Dressing: KerraCel Ag Gelling Fiber Dressing, 4x5 in (silver alginate) 1 x Per Week/30 Days ary Discharge Instructions: Apply silver alginate to wound bed as instructed Secondary Dressing: Woven Gauze Sponge, Non-Sterile 4x4 in 1 x Per Week/30 Days Discharge Instructions: Apply over primary dressing as directed. Compression Wrap: ThreePress (3 layer compression wrap) 1 x Per Week/30 Days Discharge Instructions: Apply three layer compression as directed. Wound #8 - Lower Leg Wound Laterality: Left, Posterior Cleanser: Soap and Water 1 x Per Week/30 Days Discharge Instructions: May shower and wash wound with dial antibacterial soap and water prior to dressing change. Cleanser: Wound Cleanser 1 x Per Week/30 Days Discharge Instructions: Cleanse the wound with wound cleanser prior to applying a clean dressing using gauze sponges, not tissue or cotton balls. Peri-Wound Care: Triamcinolone 15 (g) 1 x Per Week/30 Days Discharge Instructions: Use triamcinolone 15 (g) as directed Peri-Wound Care: Sween Lotion (Moisturizing lotion) 1 x Per Week/30 Days Discharge Instructions: Apply moisturizing lotion as directed Prim Dressing: KerraCel Ag Gelling Fiber Dressing, 4x5 in (silver alginate) 1 x Per Week/30 Days ary Discharge Instructions: Apply silver alginate to wound bed as instructed Secondary Dressing: Woven Gauze Sponge, Non-Sterile 4x4 in 1 x Per Week/30 Days Discharge Instructions: Apply over primary dressing as directed. Compression Wrap: ThreePress (3 layer compression wrap) 1 x Per Week/30 Days Discharge  Instructions: Apply three layer compression as directed. Wound #9 - Lower Leg Wound Laterality: Left, Anterior Cleanser: Soap and Water 1 x Per Week/30 Days Discharge Instructions: May shower and wash wound with dial antibacterial soap and water prior to dressing change. Cleanser: Wound Cleanser 1 x Per Week/30 Days Discharge Instructions: Cleanse the wound with wound cleanser prior to applying a clean dressing using gauze sponges, not tissue or cotton balls. Peri-Wound Care: Triamcinolone 15 (g) 1 x Per Week/30 Days Discharge Instructions: Use triamcinolone 15 (g) as directed Peri-Wound Care: Sween Lotion (Moisturizing lotion) 1 x Per Week/30 Days Discharge Instructions: Apply moisturizing lotion as directed Prim Dressing: KerraCel Ag Gelling Fiber Dressing, 4x5 in (silver alginate) 1 x Per Week/30 Days ary Discharge Instructions: Apply silver alginate to wound bed as instructed Secondary Dressing: Woven Gauze Sponge, Non-Sterile 4x4 in 1 x Per Week/30 Days Discharge Instructions: Apply over primary dressing as directed. Compression Wrap: ThreePress (3 layer compression wrap) 1 x Per Week/30 Days Discharge Instructions: Apply three layer compression as directed. Vanessa Palmer, Vanessa Palmer ([id number]) [id number] Page 4 of 9 Electronic Signature(s) Signed: 12/21/2021 4:37:04 PM By: Geralyn Corwin DO Entered By: Geralyn Corwin on 12/21/2021 15:39:59 -------------------------------------------------------------------------------- Problem List Details Patient Name: Date of Service: [name], Vanessa Palmer. 12/21/2021 2:45 PM Medical Record Number: [id number] Patient Account Number: [id number] Date of Birth/Sex: Treating RN: [birth year] (71 y.o. F) Primary Care Provider: Harriet Masson Other Clinician: Referring Provider: Treating Provider/Extender: Irineo Axon in Treatment: 6 Active Problems ICD-10 Encounter Code Description Active Date  MDM Diagnosis I89.0 Lymphedema, not elsewhere classified 11/09/2021 No Yes L97.818 Non-pressure chronic ulcer of other part of right lower leg with other specified 11/09/2021 No Yes severity L97.828 Non-pressure chronic ulcer of other part of left lower leg with other specified 11/09/2021 No Yes severity I87.303 Chronic venous hypertension (idiopathic) without complications of bilateral 11/09/2021 No Yes lower extremity Inactive Problems Resolved Problems Electronic Signature(s) Signed: 12/21/2021 4:37:04 PM By: Geralyn Corwin DO Entered By: Geralyn Corwin on 12/21/2021 15:37:33 -------------------------------------------------------------------------------- Progress Note Details Patient Name: Date of Service: Vanessa Rad Palmer. 12/21/2021 2:45 PM Medical Record Number: [id number] Patient Account Number: [id number] Date  of Birth/Sex: Treating RN: Apr 29, 1950 (72 y.o. F) Primary Care Provider: Maudry Mayhew Other Clinician: Referring Provider: Treating Provider/Extender: Theressa Millard in Treatment: [place], Billee Cashing ([id number]) [id number] Page 5 of 9 Chief Complaint Information obtained from Patient 09/07/2021; scattered open wounds to the lower extremities bilaterally 11/09/21; Patient returns to clinic with recurrent small weeping areas on her bilateral lower legs History of Present Illness (HPI) Admission 09/07/2021 [name] is a 71 year old female with a past medical history of essential hypertension, chronic kidney disease stage III, and lymphedema that presents to the clinic for a 2-week history of scattered open wounds to her lower extremities bilaterally. She states this has been an ongoing issue for the past year. She states that the wounds wax and wane in healing. She currently takes torsemide. She does not wear compression stockings. She currently denies signs of infection. 7/20; patient presents  for follow-up. She received 1 juxta lite compression wrap in the mail. She was supposed to receive 2. She states she talked to the company and they are sending her her second pair. We used antibiotic ointment and silver alginate under 3 layer compression at last clinic visit. The silver alginate stuck to the wound bed. The left lower extremity wounds have healed. 7/27; patient presents for follow-up. She still has not received her juxta lite compression for the right leg. We spoke with the wound care supply company today and they stated it will arrive today. We have been using Xeroform under 3 layer compression to the right lower extremity. She has been using the juxta lite compression to the left lower extremity. Her wounds remain healed to the left lower extremity. READMISSION 11/09/2021 This is a patient that we had in clinic in July. She has lymphedema secondary to chronic venous insufficiency and had scattered open areas on her bilateral lower extremities she received 3 layer compression bilaterally the area is closed over and she was prescribed juxta lite stockings. She tells me she was compliant with the juxta lites, apparently she has home health aides to help her put these on. About 3 weeks ago she noted increase in swelling and weeping areas in her bilateral lower legs and she is in for review of this. She does not have arterial issues her ABIs in the clinic last time were quite normal bilaterally. She does have chronic kidney disease stage III and lumbar radiculopathy. 9/21; patient presents for follow-up. She presented last week with open wounds to her legs bilaterally. Silver alginate under 3 layer compression was used. Her left lower extremity wounds have healed. She has a juxta light compression at home. She still has open wounds on the right lower extremity. She denies signs of infection. She tolerated the compression wraps well. 9/28; as I understand things we actually healed out her  left leg last week and transitioned her to juxta lites however almost immediately fluid ripped out of several open areas on the left leg. She comes in today with significant bilateral lower extremity edema. 10/5; patient presents for follow-up. Her left leg has healed again. She states she has her juxta lite compression wraps at home. We have been using silver alginate with 3 layer compression to the right lower extremity. She has no issues or complaints today. 10/12; patient presents for follow-up. We will wrapped her right leg with silver alginate under 3 layer compression at last clinic visit. Her left lower extremity wounds had healed and she was using her juxta lite compression wraps.  Unfortunately she has reopened wounds to this leg. 10/26; patient presents for follow-up. She has been without compression wraps since Monday. She went for her venous reflux studies however could not tolerate the ultrasound probe pressing against her leg. They were able to state that there was no DVT We have been using silver alginate under 3 layer . compression. Patient History Information obtained from Chart. Family History Diabetes - Siblings, Heart Disease - Father,Siblings. Social History Never smoker, Alcohol Use - Rarely - sober since 2006. Medical History Hematologic/Lymphatic Patient has history of Anemia Respiratory Patient has history of Asthma, Sleep Apnea - 2018- oCPAP Cardiovascular Patient has history of Congestive Heart Failure, Coronary Artery Disease, Deep Vein Thrombosis - 2014 both legs, Hypertension, Myocardial Infarction - 2005 Musculoskeletal Patient has history of Gout, Osteoarthritis Neurologic Patient has history of Seizure Disorder - as a teenager Hospitalization/Surgery History - heart cath 2018, 2003, 2004, 2006. - right total hip replacement 2016. - hysterectomy. Medical A Surgical History Notes nd Constitutional Symptoms (General Health) chronic back pain Migraine last  2018 Cardiovascular hyperlipidemia Gastrointestinal GERD Genitourinary stage III CKD Musculoskeletal DJD Psychiatric depression Vanessa Palmer, Vanessa Palmer ([id number]) [id number] Page 6 of 9 Objective Constitutional respirations regular, non-labored and within target range for patient.. Vitals Time Taken: 2:50 PM, Temperature: 97.6 F, Pulse: 69 bpm, Respiratory Rate: 20 breaths/min, Blood Pressure: 161/86 mmHg. Cardiovascular 2+ dorsalis pedis/posterior tibialis pulses. Psychiatric pleasant and cooperative. General Notes: Wounds to the lower extremities bilaterally with granulation tissue. 2+ pitting edema to the knee. No signs of surrounding infection. Integumentary (Hair, Skin) Wound #6 status is Open. Original cause of wound was Blister. The date acquired was: 10/23/2021. The wound has been in treatment 6 weeks. The wound is located on the Right,Posterior Lower Leg. The wound measures 7cm length x 6.5cm width x 0.1cm depth; 35.736cm^2 area and 3.574cm^3 volume. There is no tunneling or undermining noted. There is a medium amount of serous drainage noted. There is large (67-100%) red granulation within the wound bed. There is no necrotic tissue within the wound bed. The periwound skin appearance did not exhibit: Callus, Crepitus, Excoriation, Induration, Rash, Scarring, Dry/Scaly, Maceration, Atrophie Blanche, Cyanosis, Ecchymosis, Hemosiderin Staining, Mottled, Pallor, Rubor, Erythema. Wound #8 status is Open. Original cause of wound was Gradually Appeared. The date acquired was: 12/05/2021. The wound has been in treatment 2 weeks. The wound is located on the Left,Posterior Lower Leg. The wound measures 0.2cm length x 0.5cm width x 0.1cm depth; 0.079cm^2 area and 0.008cm^3 volume. There is no tunneling or undermining noted. There is a medium amount of serosanguineous drainage noted. There is large (67-100%) pink granulation within the wound bed. The periwound skin  appearance did not exhibit: Callus, Crepitus, Excoriation, Induration, Rash, Scarring, Dry/Scaly, Maceration, Atrophie Blanche, Cyanosis, Ecchymosis, Hemosiderin Staining, Mottled, Pallor, Rubor, Erythema. Wound #9 status is Open. Original cause of wound was Gradually Appeared. The date acquired was: 12/05/2021. The wound has been in treatment 2 weeks. The wound is located on the Left,Anterior Lower Leg. The wound measures 1cm length x 1.1cm width x 0.1cm depth; 0.864cm^2 area and 0.086cm^3 volume. There is no tunneling or undermining noted. There is a medium amount of serosanguineous drainage noted. There is a medium (34-66%) amount of necrotic tissue within the wound bed. The periwound skin appearance exhibited: Maceration. The periwound skin appearance did not exhibit: Callus, Crepitus, Excoriation, Induration, Rash, Scarring, Dry/Scaly, Atrophie Blanche, Cyanosis, Ecchymosis, Hemosiderin Staining, Mottled, Pallor, Rubor, Erythema. Assessment Active Problems ICD-10 Lymphedema, not elsewhere classified Non-pressure chronic ulcer of other  part of right lower leg with other specified severity Non-pressure chronic ulcer of other part of left lower leg with other specified severity Chronic venous hypertension (idiopathic) without complications of bilateral lower extremity Patient has been without compression for the past 4 days. She has scattered open areas with weeping. No signs of surrounding infection. I recommended going back to silver alginate under 3 layer compression. Unfortunately she was unable to tolerate her venous reflux studies. Patient will be out of town later next week. Follow-up early next week for nurse visit (wrap change). Plan Follow-up Appointments: Return appointment in 3 weeks. - on Monday w/ Dr. Heber Sellers Nurse Visit: - this coming Monday and the Monday after (12/25/21 and 01/01/22) Anesthetic: (In clinic) Topical Lidocaine 5% applied to wound bed (In clinic) Topical  Lidocaine 4% applied to wound bed Bathing/ Shower/ Hygiene: May shower with protection but do not get wound dressing(s) wet. - Can use cast protector bag from [facility], [facility] or [employer] Edema Control - Lymphedema / SCD / Other: Elevate legs to the level of the heart or above for 30 minutes daily and/or when sitting, a frequency of: - throughout the day Avoid standing for long periods of time. Other Edema Control Orders/Instructions: - We will try and see about ordering lymphedema pumps after you've had at least 4 weeks of wearing compression wraps. WOUND #6: - Lower Leg Wound Laterality: Right, Posterior Cleanser: Soap and Water 1 x Per Week/30 Days Discharge Instructions: May shower and wash wound with dial antibacterial soap and water prior to dressing change. Cleanser: Wound Cleanser 1 x Per Week/30 Days Discharge Instructions: Cleanse the wound with wound cleanser prior to applying a clean dressing using gauze sponges, not tissue or cotton balls. Peri-Wound Care: Triamcinolone 15 (g) 1 x Per Week/30 Days Discharge Instructions: Use triamcinolone 15 (g) as directed Peri-Wound Care: Sween Lotion (Moisturizing lotion) 1 x Per Week/30 Days Vanessa Palmer, Vanessa Palmer ([id number]) [id number] Page 7 of 9 Discharge Instructions: Apply moisturizing lotion as directed Prim Dressing: KerraCel Ag Gelling Fiber Dressing, 4x5 in (silver alginate) 1 x Per Week/30 Days ary Discharge Instructions: Apply silver alginate to wound bed as instructed Secondary Dressing: Woven Gauze Sponge, Non-Sterile 4x4 in 1 x Per Week/30 Days Discharge Instructions: Apply over primary dressing as directed. Com pression Wrap: ThreePress (3 layer compression wrap) 1 x Per Week/30 Days Discharge Instructions: Apply three layer compression as directed. WOUND #8: - Lower Leg Wound Laterality: Left, Posterior Cleanser: Soap and Water 1 x Per Week/30 Days Discharge Instructions: May shower and wash wound with  dial antibacterial soap and water prior to dressing change. Cleanser: Wound Cleanser 1 x Per Week/30 Days Discharge Instructions: Cleanse the wound with wound cleanser prior to applying a clean dressing using gauze sponges, not tissue or cotton balls. Peri-Wound Care: Triamcinolone 15 (g) 1 x Per Week/30 Days Discharge Instructions: Use triamcinolone 15 (g) as directed Peri-Wound Care: Sween Lotion (Moisturizing lotion) 1 x Per Week/30 Days Discharge Instructions: Apply moisturizing lotion as directed Prim Dressing: KerraCel Ag Gelling Fiber Dressing, 4x5 in (silver alginate) 1 x Per Week/30 Days ary Discharge Instructions: Apply silver alginate to wound bed as instructed Secondary Dressing: Woven Gauze Sponge, Non-Sterile 4x4 in 1 x Per Week/30 Days Discharge Instructions: Apply over primary dressing as directed. Com pression Wrap: ThreePress (3 layer compression wrap) 1 x Per Week/30 Days Discharge Instructions: Apply three layer compression as directed. WOUND #9: - Lower Leg Wound Laterality: Left, Anterior Cleanser: Soap and Water 1 x Per Week/30 Days Discharge Instructions:  May shower and wash wound with dial antibacterial soap and water prior to dressing change. Cleanser: Wound Cleanser 1 x Per Week/30 Days Discharge Instructions: Cleanse the wound with wound cleanser prior to applying a clean dressing using gauze sponges, not tissue or cotton balls. Peri-Wound Care: Triamcinolone 15 (g) 1 x Per Week/30 Days Discharge Instructions: Use triamcinolone 15 (g) as directed Peri-Wound Care: Sween Lotion (Moisturizing lotion) 1 x Per Week/30 Days Discharge Instructions: Apply moisturizing lotion as directed Prim Dressing: KerraCel Ag Gelling Fiber Dressing, 4x5 in (silver alginate) 1 x Per Week/30 Days ary Discharge Instructions: Apply silver alginate to wound bed as instructed Secondary Dressing: Woven Gauze Sponge, Non-Sterile 4x4 in 1 x Per Week/30 Days Discharge Instructions: Apply over  primary dressing as directed. Com pression Wrap: ThreePress (3 layer compression wrap) 1 x Per Week/30 Days Discharge Instructions: Apply three layer compression as directed. 1. Silver alginate under 3 layer compression 2. Follow-up in 1 week Electronic Signature(s) Signed: 12/21/2021 4:37:04 PM By: Kalman Shan DO Entered By: Kalman Shan on 12/21/2021 15:41:55 -------------------------------------------------------------------------------- HxROS Details Patient Name: Date of Service: Vanessa Palmer, Vanessa Chick Palmer. 12/21/2021 2:45 PM Medical Record Number: [id number] Patient Account Number: [id number] Date of Birth/Sex: Treating RN: Nov 01, 1950 (71 y.o. F) Primary Care Provider: Maudry Mayhew Other Clinician: Referring Provider: Treating Provider/Extender: Theressa Millard in Treatment: 6 Information Obtained From Chart Constitutional Symptoms (General Health) Medical History: Past Medical History Notes: chronic back pain Migraine last 2018 Hematologic/Lymphatic Medical History: Positive for: Anemia Respiratory Medical HistoryMarland Kitchen MAEBRY, MITNICK ([id number]) [id number] Page 8 of 9 Positive for: Asthma; Sleep Apnea - 2018- oCPAP Cardiovascular Medical History: Positive for: Congestive Heart Failure; Coronary Artery Disease; Deep Vein Thrombosis - 2014 both legs; Hypertension; Myocardial Infarction - 2005 Past Medical History Notes: hyperlipidemia Gastrointestinal Medical History: Past Medical History Notes: GERD Genitourinary Medical History: Past Medical History Notes: stage III CKD Musculoskeletal Medical History: Positive for: Gout; Osteoarthritis Past Medical History Notes: DJD Neurologic Medical History: Positive for: Seizure Disorder - as a teenager Psychiatric Medical History: Past Medical History Notes: depression Immunizations Pneumococcal Vaccine: Received Pneumococcal Vaccination: No Implantable  Devices No devices added Hospitalization / Surgery History Type of Hospitalization/Surgery heart cath 2018, 2003, 2004, 2006 right total hip replacement 2016 hysterectomy Family and Social History Diabetes: Yes - Siblings; Heart Disease: Yes - Father,Siblings; Never smoker; Alcohol Use: Rarely - sober since 2006 Electronic Signature(s) Signed: 12/21/2021 4:37:04 PM By: Kalman Shan DO Entered By: Kalman Shan on 12/21/2021 15:38:55 -------------------------------------------------------------------------------- SuperBill Details Patient Name: Date of Service: Vanessa Palmer, Vanessa Chick Palmer. 12/21/2021 Medical Record Number: [id number] Patient Account Number: [id number] Date of Birth/Sex: Treating RN: 07-28-1950 (71 y.o. F) Primary Care Provider: Maudry Mayhew Other Clinician: Referring Provider: Treating Provider/Extender: Theressa Millard in Treatment: 6 ISHEA, OPPLIGER Palmer ([id number]) [id number] Page 9 of 9 Diagnosis Coding ICD-10 Codes Code Description I89.0 Lymphedema, not elsewhere classified L97.818 Non-pressure chronic ulcer of other part of right lower leg with other specified severity L97.828 Non-pressure chronic ulcer of other part of left lower leg with other specified severity I87.303 Chronic venous hypertension (idiopathic) without complications of bilateral lower extremity Facility Procedures : CPT4: Code [id number] Description: 31 BILATERAL: Application of multi-layer venous compression system; leg (below knee), including ankle and t. Modifier: Quantity: 1 Physician Procedures : CPT4 Code Description Modifier [id number] - WC PHYS LEVEL 3 - EST PT ICD-10 Diagnosis Description L97.818 Non-pressure chronic ulcer of other part of right lower leg with other specified severity L97.828 Non-pressure chronic  ulcer of other part of  left lower leg with other specified severity I87.303 Chronic venous hypertension  (idiopathic) without complications of bilateral lower extremity I89.0 Lymphedema, not elsewhere classified Quantity: 1 Electronic Signature(s) Signed: 12/21/2021 4:37:04 PM By: Kalman Shan DO Signed: 12/21/2021 4:44:37 PM By: Rhae Hammock RN Entered By: Rhae Hammock on 12/21/2021 16:35:45

## 2021-12-25 ENCOUNTER — Encounter (HOSPITAL_BASED_OUTPATIENT_CLINIC_OR_DEPARTMENT_OTHER): Payer: Medicare Other | Admitting: Internal Medicine

## 2021-12-25 DIAGNOSIS — I130 Hypertensive heart and chronic kidney disease with heart failure and stage 1 through stage 4 chronic kidney disease, or unspecified chronic kidney disease: Secondary | ICD-10-CM | POA: Diagnosis not present

## 2021-12-25 DIAGNOSIS — I779 Disorder of arteries and arterioles, unspecified: Secondary | ICD-10-CM | POA: Diagnosis not present

## 2021-12-25 DIAGNOSIS — M1990 Unspecified osteoarthritis, unspecified site: Secondary | ICD-10-CM | POA: Diagnosis not present

## 2021-12-25 DIAGNOSIS — I872 Venous insufficiency (chronic) (peripheral): Secondary | ICD-10-CM | POA: Diagnosis not present

## 2021-12-25 DIAGNOSIS — I509 Heart failure, unspecified: Secondary | ICD-10-CM | POA: Diagnosis not present

## 2021-12-25 DIAGNOSIS — M109 Gout, unspecified: Secondary | ICD-10-CM | POA: Diagnosis not present

## 2021-12-25 DIAGNOSIS — L97818 Non-pressure chronic ulcer of other part of right lower leg with other specified severity: Secondary | ICD-10-CM | POA: Diagnosis not present

## 2021-12-25 DIAGNOSIS — D631 Anemia in chronic kidney disease: Secondary | ICD-10-CM | POA: Diagnosis not present

## 2021-12-25 DIAGNOSIS — I890 Lymphedema, not elsewhere classified: Secondary | ICD-10-CM | POA: Diagnosis not present

## 2021-12-25 DIAGNOSIS — M5416 Radiculopathy, lumbar region: Secondary | ICD-10-CM | POA: Diagnosis not present

## 2021-12-25 DIAGNOSIS — L97828 Non-pressure chronic ulcer of other part of left lower leg with other specified severity: Secondary | ICD-10-CM | POA: Diagnosis not present

## 2021-12-25 DIAGNOSIS — N183 Chronic kidney disease, stage 3 unspecified: Secondary | ICD-10-CM | POA: Diagnosis not present

## 2021-12-25 NOTE — Progress Notes (Signed)
KAYIA, PONTI ([id number]) [id number] Page 1 of 7 Visit Report for 12/25/2021 Arrival Information Details Patient Name: Date of Service: Vanessa Palmer, Vanessa Palmer 12/25/2021 11:30 A M Medical Record Number: [id number] Patient Account Number: [id number] Date of Birth/Sex: Treating RN: Jun 17, 1950 (71 y.o. F) Primary Care Kelby Lotspeich: Maudry Mayhew Other Clinician: Referring Geordie Nooney: Treating Dyneisha Murchison/Extender: Theressa Millard in Treatment: 6 Visit Information History Since Last Visit Added or deleted any medications: No Patient Arrived: Wheel Chair Any new allergies or adverse reactions: No Arrival Time: 12:09 Had a fall or experienced change in No Accompanied By: Daughter activities of daily living that may affect Transfer Assistance: None risk of falls: Patient Identification Verified: Yes Signs or symptoms of abuse/neglect since last visito No Secondary Verification Process Completed: Yes Implantable device outside of the clinic excluding No Patient Requires Transmission-Based Precautions: No cellular tissue based products placed in the center Patient Has Alerts: Yes since last visit: Patient Alerts: Patient on Blood Thinner Pain Present Now: No ABI's= R 1.05 L1.16 Electronic Signature(s) Signed: 12/25/2021 4:23:13 PM By: Erenest Blank Entered By: Erenest Blank on 12/25/2021 12:10:28 -------------------------------------------------------------------------------- Compression Therapy Details Patient Name: Date of Service: Vanessa Small Palmer. 12/25/2021 11:30 A M Medical Record Number: [id number] Patient Account Number: [id number] Date of Birth/Sex: Treating RN: 06-Apr-1950 (71 y.o. F) Primary Care Yadier Bramhall: Maudry Mayhew Other Clinician: Referring Sheffield Hawker: Treating Gedalia Mcmillon/Extender: Theressa Millard in Treatment: 6 Compression Therapy Performed for Wound Assessment: Wound #6 Right,Posterior Lower  Leg Performed By: Clinician Erenest Blank, Compression Type: Three Layer Electronic Signature(s) Signed: 12/25/2021 4:23:13 PM By: Erenest Blank Entered By: Erenest Blank on 12/25/2021 12:30:40 -------------------------------------------------------------------------------- Compression Therapy Details Patient Name: Date of Service: Vanessa Small Palmer. 12/25/2021 11:30 A M Medical Record Number: [id number] Patient Account Number: [id number] Date of Birth/Sex: Treating RN: 1950-04-25 (71 y.o. F) Primary Care Cindy Brindisi: Maudry Mayhew Other Clinician: Referring Makisha Marrin: Treating Namir Neto/Extender: Tierney, [place], Vanessa Chick Palmer ([id number]) [phone].pdf Page 2 of 7 Weeks in Treatment: 6 Compression Therapy Performed for Wound Assessment: Wound #9 Left,Anterior Lower Leg Performed By: Clinician Erenest Blank, Compression Type: Three Layer Electronic Signature(s) Signed: 12/25/2021 4:23:13 PM By: Erenest Blank Entered By: Erenest Blank on 12/25/2021 12:30:40 -------------------------------------------------------------------------------- Compression Therapy Details Patient Name: Date of Service: Vanessa Small Palmer. 12/25/2021 11:30 A M Medical Record Number: [id number] Patient Account Number: [id number] Date of Birth/Sex: Treating RN: 1950-09-29 (71 y.o. F) Primary Care Betzalel Umbarger: Maudry Mayhew Other Clinician: Referring Matalyn Nawaz: Treating Callum Wolf/Extender: Theressa Millard in Treatment: 6 Compression Therapy Performed for Wound Assessment: Wound #8 Left,Posterior Lower Leg Performed By: Clinician Erenest Blank, Compression Type: Three Layer Electronic Signature(s) Signed: 12/25/2021 4:23:13 PM By: Erenest Blank Entered By: Erenest Blank on 12/25/2021 12:30:40 -------------------------------------------------------------------------------- Encounter Discharge Information Details Patient Name: Date of  Service: Vanessa Small Palmer. 12/25/2021 11:30 A M Medical Record Number: [id number] Patient Account Number: [id number] Date of Birth/Sex: Treating RN: 09-May-1950 (71 y.o. F) Primary Care Kala Gassmann: Maudry Mayhew Other Clinician: Erenest Blank Referring Aleynah Rocchio: Treating Harvie Morua/Extender: Theressa Millard in Treatment: 6 Encounter Discharge Information Items Discharge Condition: Stable Ambulatory Status: Wheelchair Discharge Destination: Home Transportation: Private Auto Accompanied By: daughter Schedule Follow-up Appointment: Yes Clinical Summary of Care: Electronic Signature(s) Signed: 12/25/2021 4:23:13 PM By: Erenest Blank Entered By: Erenest Blank on 12/25/2021 12:31:30 Patient/Caregiver Education Details -------------------------------------------------------------------------------- Vanessa Palmer ([id number]) [id number] Page 3 of 7 Patient Name: Date of Service: Vanessa Palmer, Vanessa Palmer 10/30/2023andnbsp11:30 A M Medical Record Number: [id number] Patient Account Number:  [id number] Date of Birth/Gender: Treating RN: 09-15-1950 (71 y.o. F) Primary Care Physician: Maudry Mayhew Other Clinician: Referring Physician: Treating Physician/Extender: Theressa Millard in Treatment: 6 Education Assessment Education Provided To: Patient Education Topics Provided Electronic Signature(s) Signed: 12/25/2021 4:23:13 PM By: Erenest Blank Entered By: Erenest Blank on 12/25/2021 12:31:03 -------------------------------------------------------------------------------- Wound Assessment Details Patient Name: Date of Service: Vanessa Small Palmer. 12/25/2021 11:30 A M Medical Record Number: [id number] Patient Account Number: [id number] Date of Birth/Sex: Treating RN: 1950/12/16 (71 y.o. F) Primary Care Lovette Merta: Maudry Mayhew Other Clinician: Referring Latona Krichbaum: Treating [place]/Extender: Theressa Millard in Treatment: 6 Wound Status Wound Number: 6 Primary Etiology: Lymphedema Wound Location: Right, Posterior Lower Leg Wound Status: Open Wounding Event: Blister Date Acquired: 10/23/2021 Weeks Of Treatment: 6 Clustered Wound: No Wound Measurements Length: (cm) 7 Width: (cm) 6.5 Depth: (cm) 0.1 Area: (cm) 35.736 Volume: (cm) 3.574 % Reduction in Area: -22661.8% % Reduction in Volume: -22237.5% Wound Description Classification: Full Thickness Without Exposed Support Exudate Amount: Medium Exudate Type: Serous Exudate Color: amber Structures Periwound Skin Texture Texture Color No Abnormalities Noted: No No Abnormalities Noted: No Moisture No Abnormalities Noted: No Treatment Notes Wound #6 (Lower Leg) Wound Laterality: Right, Posterior Cleanser Soap and Water Discharge Instruction: May shower and wash wound with dial antibacterial soap and water prior to dressing change. Wound Cleanser Discharge Instruction: Cleanse the wound with wound cleanser prior to applying a clean dressing using gauze sponges, not tissue or cotton Vanessa Palmer, Vanessa Palmer ([id number]) [phone].pdf Page 4 of 7 balls. Peri-Wound Care Triamcinolone 15 (g) Discharge Instruction: Use triamcinolone 15 (g) as directed Sween Lotion (Moisturizing lotion) Discharge Instruction: Apply moisturizing lotion as directed Topical Primary Dressing KerraCel Ag Gelling Fiber Dressing, 4x5 in (silver alginate) Discharge Instruction: Apply silver alginate to wound bed as instructed Secondary Dressing Woven Gauze Sponge, Non-Sterile 4x4 in Discharge Instruction: Apply over primary dressing as directed. Secured With Compression Wrap ThreePress (3 layer compression wrap) Discharge Instruction: Apply three layer compression as directed. Compression Stockings Add-Ons Electronic Signature(s) Signed: 12/25/2021 4:23:13 PM By: Erenest Blank Entered By: Erenest Blank  on 12/25/2021 12:30:06 -------------------------------------------------------------------------------- Wound Assessment Details Patient Name: Date of Service: Vanessa Small Palmer. 12/25/2021 11:30 A M Medical Record Number: [id number] Patient Account Number: [id number] Date of Birth/Sex: Treating RN: 12-31-1950 (71 y.o. F) Primary Care Iolanda Folson: Maudry Mayhew Other Clinician: Referring Alyne Martinson: Treating Dontrez Pettis/Extender: Theressa Millard in Treatment: 6 Wound Status Wound Number: 8 Primary Etiology: Lymphedema Wound Location: Left, Posterior Lower Leg Wound Status: Open Wounding Event: Gradually Appeared Date Acquired: 12/05/2021 Weeks Of Treatment: 2 Clustered Wound: No Wound Measurements Length: (cm) Width: (cm) Depth: (cm) Area: (cm) Volume: (cm) 0.2 % Reduction in Area: 93.7% 0.5 % Reduction in Volume: 93.7% 0.1 0.079 0.008 Wound Description Classification: Full Thickness Without Exposed Sup Exudate Amount: Medium Exudate Type: Serosanguineous Exudate Color: red, brown port Structures Periwound Skin Texture Texture Color No Abnormalities Noted: No No Abnormalities Noted: No Moisture Vanessa Palmer, Vanessa Palmer ([id number]) [id number] Page 5 of 7 No Abnormalities Noted: No Treatment Notes Wound #8 (Lower Leg) Wound Laterality: Left, Posterior Cleanser Soap and Water Discharge Instruction: May shower and wash wound with dial antibacterial soap and water prior to dressing change. Wound Cleanser Discharge Instruction: Cleanse the wound with wound cleanser prior to applying a clean dressing using gauze sponges, not tissue or cotton balls. Peri-Wound Care Triamcinolone 15 (g) Discharge Instruction: Use triamcinolone 15 (g) as directed Sween Lotion (Moisturizing lotion) Discharge Instruction: Apply moisturizing lotion as  directed Topical Primary Dressing KerraCel Ag Gelling Fiber Dressing, 4x5 in (silver  alginate) Discharge Instruction: Apply silver alginate to wound bed as instructed Secondary Dressing Woven Gauze Sponge, Non-Sterile 4x4 in Discharge Instruction: Apply over primary dressing as directed. Secured With Compression Wrap ThreePress (3 layer compression wrap) Discharge Instruction: Apply three layer compression as directed. Compression Stockings Add-Ons Electronic Signature(s) Signed: 12/25/2021 4:23:13 PM By: Erenest Blank Entered By: Erenest Blank on 12/25/2021 12:30:07 -------------------------------------------------------------------------------- Wound Assessment Details Patient Name: Date of Service: Vanessa Small Palmer. 12/25/2021 11:30 A M Medical Record Number: [id number] Patient Account Number: [id number] Date of Birth/Sex: Treating RN: [birth year] (71 y.o. F) Primary Care Reya Aurich: Maudry Mayhew Other Clinician: Referring Rawlin Reaume: Treating Johna Kearl/Extender: Theressa Millard in Treatment: 6 Wound Status Wound Number: 9 Primary Etiology: Lymphedema Wound Location: Left, Anterior Lower Leg Wound Status: Open Wounding Event: Gradually Appeared Date Acquired: 12/05/2021 Weeks Of Treatment: 2 Clustered Wound: No Wound Measurements Length: (cm) 1 Width: (cm) 1.1 Depth: (cm) 0.1 Area: (cm) 0.864 Volume: (cm) 0.086 Vanessa Palmer, Vanessa Palmer ([id number]) % Reduction in Area: 33.3% % Reduction in Volume: 33.8% [id number] Page 6 of 7 Wound Description Classification: Full Thickness Without Exposed Suppo Exudate Amount: Medium Exudate Type: Serosanguineous Exudate Color: red, brown rt Structures Periwound Skin Texture Texture Color No Abnormalities Noted: No No Abnormalities Noted: No Moisture No Abnormalities Noted: No Treatment Notes Wound #9 (Lower Leg) Wound Laterality: Left, Anterior Cleanser Soap and Water Discharge Instruction: May shower and wash wound with dial antibacterial soap and water  prior to dressing change. Wound Cleanser Discharge Instruction: Cleanse the wound with wound cleanser prior to applying a clean dressing using gauze sponges, not tissue or cotton balls. Peri-Wound Care Triamcinolone 15 (g) Discharge Instruction: Use triamcinolone 15 (g) as directed Sween Lotion (Moisturizing lotion) Discharge Instruction: Apply moisturizing lotion as directed Topical Primary Dressing KerraCel Ag Gelling Fiber Dressing, 4x5 in (silver alginate) Discharge Instruction: Apply silver alginate to wound bed as instructed Secondary Dressing Woven Gauze Sponge, Non-Sterile 4x4 in Discharge Instruction: Apply over primary dressing as directed. Secured With Compression Wrap ThreePress (3 layer compression wrap) Discharge Instruction: Apply three layer compression as directed. Compression Stockings Add-Ons Electronic Signature(s) Signed: 12/25/2021 4:23:13 PM By: Erenest Blank Entered By: Erenest Blank on 12/25/2021 12:30:07 -------------------------------------------------------------------------------- Vitals Details Patient Name: Date of Service: Vanessa Palmer, Vanessa Chick Palmer. 12/25/2021 11:30 A M Medical Record Number: [id number] Patient Account Number: [id number] Date of Birth/Sex: Treating RN: 1951-01-04 (71 y.o. F) Primary Care Quintasia Theroux: Maudry Mayhew Other Clinician: Referring Kamarie Palma: Treating Tavoris Brisk/Extender: Theressa Millard in Treatment: 6 Vital Signs Time Taken: 12:10 Temperature (F): 97.7 Pulse (bpm): 58 Vanessa Palmer, Vanessa Palmer ([id number]) [id number] Page 7 of 7 Respiratory Rate (breaths/min): 18 Blood Pressure (mmHg): 144/89 Reference Range: 80 - 120 mg / dl Electronic Signature(s) Signed: 12/25/2021 4:23:13 PM By: Erenest Blank Entered By: Erenest Blank on 12/25/2021 12:13:20

## 2021-12-25 NOTE — Progress Notes (Signed)
SALLEY, BOXLEY ([id number]) [id number] Page 1 of 1 Visit Report for 12/25/2021 SuperBill Details Patient Name: Date of Service: Vanessa Palmer, Vanessa Palmer 12/25/2021 Medical Record Number: [id number] Patient Account Number: [id number] Date of Birth/Sex: Treating RN: 08-Apr-1950 (71 y.o. F) Primary Care Provider: Maudry Mayhew Other Clinician: Referring Provider: Treating Provider/Extender: Theressa Millard in Treatment: 6 Diagnosis Coding ICD-10 Codes Code Description I89.0 Lymphedema, not elsewhere classified L97.818 Non-pressure chronic ulcer of other part of right lower leg with other specified severity L97.828 Non-pressure chronic ulcer of other part of left lower leg with other specified severity I87.303 Chronic venous hypertension (idiopathic) without complications of bilateral lower extremity Facility Procedures CPT4 Description Modifier Quantity Code [id number] BILATERAL: Application of multi-layer venous compression system; leg (below knee), including ankle and 1 foot. Electronic Signature(s) Signed: 12/25/2021 2:03:41 PM By: Kalman Shan DO Signed: 12/25/2021 4:23:13 PM By: Erenest Blank Entered By: Erenest Blank on 12/25/2021 12:31:54

## 2022-01-01 ENCOUNTER — Other Ambulatory Visit: Payer: Self-pay | Admitting: Physician Assistant

## 2022-01-01 ENCOUNTER — Telehealth: Payer: Self-pay | Admitting: Orthopaedic Surgery

## 2022-01-01 ENCOUNTER — Encounter (HOSPITAL_BASED_OUTPATIENT_CLINIC_OR_DEPARTMENT_OTHER): Payer: Medicare Other | Attending: General Surgery | Admitting: General Surgery

## 2022-01-01 DIAGNOSIS — I890 Lymphedema, not elsewhere classified: Secondary | ICD-10-CM | POA: Insufficient documentation

## 2022-01-01 DIAGNOSIS — L97828 Non-pressure chronic ulcer of other part of left lower leg with other specified severity: Secondary | ICD-10-CM | POA: Insufficient documentation

## 2022-01-01 DIAGNOSIS — N183 Chronic kidney disease, stage 3 unspecified: Secondary | ICD-10-CM | POA: Insufficient documentation

## 2022-01-01 DIAGNOSIS — I87303 Chronic venous hypertension (idiopathic) without complications of bilateral lower extremity: Secondary | ICD-10-CM | POA: Diagnosis not present

## 2022-01-01 DIAGNOSIS — I129 Hypertensive chronic kidney disease with stage 1 through stage 4 chronic kidney disease, or unspecified chronic kidney disease: Secondary | ICD-10-CM | POA: Insufficient documentation

## 2022-01-01 DIAGNOSIS — L97818 Non-pressure chronic ulcer of other part of right lower leg with other specified severity: Secondary | ICD-10-CM | POA: Diagnosis not present

## 2022-01-01 MED ORDER — TIZANIDINE HCL 4 MG PO TABS: ORAL_TABLET | ORAL | 0 refills | Status: DC

## 2022-01-01 NOTE — Progress Notes (Signed)
AVANELL, BANWART ([id number]) [id number] Page 1 of 1 Visit Report for 01/01/2022 SuperBill Details Patient Name: Date of Service: Vanessa Palmer, Vanessa Palmer 01/01/2022 Medical Record Number: [id number] Patient Account Number: [id number] Date of Birth/Sex: Treating RN: 1950/11/11 (71 y.o. F) Primary Care Provider: Maudry Mayhew Other Clinician: Referring Provider: Treating Provider/Extender: Horris Latino in Treatment: 7 Diagnosis Coding ICD-10 Codes Code Description I89.0 Lymphedema, not elsewhere classified L97.818 Non-pressure chronic ulcer of other part of right lower leg with other specified severity L97.828 Non-pressure chronic ulcer of other part of left lower leg with other specified severity I87.303 Chronic venous hypertension (idiopathic) without complications of bilateral lower extremity Facility Procedures CPT4 Code Description Modifier Quantity [id number] - WOUND CARE VISIT-LEV 2 EST PT 1 Electronic Signature(s) Signed: 01/01/2022 2:00:07 PM By: Erenest Blank Signed: 01/01/2022 4:03:30 PM By: Fredirick Maudlin MD FACS Entered By: Erenest Blank on 01/01/2022 13:48:33

## 2022-01-08 ENCOUNTER — Encounter (HOSPITAL_BASED_OUTPATIENT_CLINIC_OR_DEPARTMENT_OTHER): Payer: Medicare Other | Admitting: Internal Medicine

## 2022-01-08 DIAGNOSIS — L97828 Non-pressure chronic ulcer of other part of left lower leg with other specified severity: Secondary | ICD-10-CM | POA: Diagnosis not present

## 2022-01-08 DIAGNOSIS — L97818 Non-pressure chronic ulcer of other part of right lower leg with other specified severity: Secondary | ICD-10-CM

## 2022-01-08 DIAGNOSIS — I890 Lymphedema, not elsewhere classified: Secondary | ICD-10-CM

## 2022-01-08 DIAGNOSIS — N183 Chronic kidney disease, stage 3 unspecified: Secondary | ICD-10-CM | POA: Diagnosis not present

## 2022-01-08 DIAGNOSIS — I87303 Chronic venous hypertension (idiopathic) without complications of bilateral lower extremity: Secondary | ICD-10-CM | POA: Diagnosis not present

## 2022-01-08 DIAGNOSIS — I129 Hypertensive chronic kidney disease with stage 1 through stage 4 chronic kidney disease, or unspecified chronic kidney disease: Secondary | ICD-10-CM | POA: Diagnosis not present

## 2022-01-10 NOTE — Progress Notes (Addendum)
Vanessa, OSLAND ([id number]) [id number] Page 1 of 9 Visit Report for 01/08/2022 Chief Complaint Document Details Patient Name: Date of Service: Vanessa Palmer, Vanessa Palmer 01/08/2022 2:45 PM Medical Record Number: [id number] Patient Account Number: [id number] Date of Birth/Sex: Treating RN: 04-29-1950 (71 y.o. F) Primary Care Provider: Harriet Masson Other Clinician: Referring Provider: Treating Provider/Extender: Irineo Axon in Treatment: 8 Information Obtained from: Patient Chief Complaint 09/07/2021; scattered open wounds to the lower extremities bilaterally 11/09/21; Patient returns to clinic with recurrent small weeping areas on her bilateral lower legs Electronic Signature(s) Signed: 01/10/2022 9:20:09 AM By: Geralyn Corwin DO Entered By: Geralyn Corwin on 01/10/2022 09:12:08 -------------------------------------------------------------------------------- HPI Details Patient Name: Date of Service: Vanessa Palmer, Vanessa Palmer. 01/08/2022 2:45 PM Medical Record Number: [id number] Patient Account Number: [id number] Date of Birth/Sex: Treating RN: 01/22/1951 (71 y.o. F) Primary Care Provider: Harriet Masson Other Clinician: Referring Provider: Treating Provider/Extender: Irineo Axon in Treatment: 8 History of Present Illness HPI Description: Admission 09/07/2021 [name] is a 71 year old female with a past medical history of essential hypertension, chronic kidney disease stage III, and lymphedema that presents to the clinic for a 2-week history of scattered open wounds to her lower extremities bilaterally. She states this has been an ongoing issue for the past year. She states that the wounds wax and wane in healing. She currently takes torsemide. She does not wear compression stockings. She currently denies signs of infection. 7/20; patient presents for follow-up. She received 1 juxta lite  compression wrap in the mail. She was supposed to receive 2. She states she talked to the company and they are sending her her second pair. We used antibiotic ointment and silver alginate under 3 layer compression at last clinic visit. The silver alginate stuck to the wound bed. The left lower extremity wounds have healed. 7/27; patient presents for follow-up. She still has not received her juxta lite compression for the right leg. We spoke with the wound care supply company today and they stated it will arrive today. We have been using Xeroform under 3 layer compression to the right lower extremity. She has been using the juxta lite compression to the left lower extremity. Her wounds remain healed to the left lower extremity. READMISSION 11/09/2021 This is a patient that we had in clinic in July. She has lymphedema secondary to chronic venous insufficiency and had scattered open areas on her bilateral lower extremities she received 3 layer compression bilaterally the area is closed over and she was prescribed juxta lite stockings. She tells me she was compliant with the juxta lites, apparently she has home health aides to help her put these on. About 3 weeks ago she noted increase in swelling and weeping areas in her bilateral lower legs and she is in for review of this. She does not have arterial issues her ABIs in the clinic last time were quite normal bilaterally. She does have chronic kidney disease stage III and lumbar radiculopathy. 9/21; patient presents for follow-up. She presented last week with open wounds to her legs bilaterally. Silver alginate under 3 layer compression was used. Her left lower extremity wounds have healed. She has a juxta light compression at home. She still has open wounds on the right lower extremity. She denies signs of infection. She tolerated the compression wraps well. 9/28; as I understand things we actually healed out her left leg last week and transitioned her  to juxta lites however almost immediately fluid ripped out of Vanessa Palmer, Vanessa  Palmer ([id number]) [id number] Page 2 of 9 several open areas on the left leg. She comes in today with significant bilateral lower extremity edema. 10/5; patient presents for follow-up. Her left leg has healed again. She states she has her juxta lite compression wraps at home. We have been using silver alginate with 3 layer compression to the right lower extremity. She has no issues or complaints today. 10/12; patient presents for follow-up. We will wrapped her right leg with silver alginate under 3 layer compression at last clinic visit. Her left lower extremity wounds had healed and she was using her juxta lite compression wraps. Unfortunately she has reopened wounds to this leg. 10/26; patient presents for follow-up. She has been without compression wraps since Monday. She went for her venous reflux studies however could not tolerate the ultrasound probe pressing against her leg. They were able to state that there was no DVT We have been using silver alginate under 3 layer . compression. 11/15; patient presents for follow-up. At last nurse visit she did not have open wounds and was placed in her juxta lite compression. She did not come in wearing these. She has a few new scattered open areas limited to skin breakdown T the lower extremities bilaterally. o Electronic Signature(s) Signed: 01/10/2022 9:20:09 AM By: Geralyn Corwin DO Entered By: Geralyn Corwin on 01/10/2022 09:14:02 -------------------------------------------------------------------------------- Physical Exam Details Patient Name: Date of Service: Vanessa Rad Palmer. 01/08/2022 2:45 PM Medical Record Number: [id number] Patient Account Number: [id number] Date of Birth/Sex: Treating RN: 12-13-1950 (71 y.o. F) Primary Care Provider: Harriet Masson Other Clinician: Referring Provider: Treating Provider/Extender: Irineo Axon in Treatment: 8 Constitutional respirations regular, non-labored and within target range for patient.. Cardiovascular 2+ dorsalis pedis/posterior tibialis pulses. Psychiatric pleasant and cooperative. Notes Scattered wounds to the lower extremities bilaterally limited to skin breakdown. No signs of surrounding infection. 2+ pitting edema to the knees. Electronic Signature(s) Signed: 01/10/2022 9:20:09 AM By: Geralyn Corwin DO Entered By: Geralyn Corwin on 01/10/2022 09:15:13 -------------------------------------------------------------------------------- Physician Orders Details Patient Name: Date of Service: Vanessa Palmer, Vanessa Palmer. 01/08/2022 2:45 PM Medical Record Number: [id number] Patient Account Number: [id number] Date of Birth/Sex: Treating RN: 1950-12-21 (71 y.o. Ardis Rowan, Lauren Primary Care Provider: Harriet Masson Other Clinician: Referring Provider: Treating Provider/Extender: Irineo Axon in Treatment: 8 Verbal / Phone Orders: No Diagnosis Coding Follow-up Appointments ppointment in 1 week. - w/ Dr. Mikey Bussing Return A Anesthetic FREDRIKA, CANBY Palmer ([id number]) [id number] Page 3 of 9 (In clinic) Topical Lidocaine 5% applied to wound bed (In clinic) Topical Lidocaine 4% applied to wound bed Bathing/ Shower/ Hygiene May shower with protection but do not get wound dressing(s) wet. - Can use cast protector bag from [facility], [facility] or [employer] Edema Control - Lymphedema / SCD / Other Elevate legs to the level of the heart or above for 30 minutes daily and/or when sitting, a frequency of: - throughout the day Avoid standing for long periods of time. Other Edema Control Orders/Instructions: - We will try and see about ordering lymphedema pumps after you've had at least 4 weeks of wearing compression wraps. Wound Treatment Wound #6 - Lower Leg Wound Laterality: Right, Medial Cleanser:  Soap and Water 1 x Per Week/30 Days Discharge Instructions: May shower and wash wound with dial antibacterial soap and water prior to dressing change. Cleanser: Wound Cleanser 1 x Per Week/30 Days Discharge Instructions: Cleanse the wound with wound cleanser prior to applying a clean dressing using gauze sponges, not tissue or  cotton balls. Peri-Wound Care: Triamcinolone 15 (g) 1 x Per Week/30 Days Discharge Instructions: Use triamcinolone 15 (g) as directed Peri-Wound Care: Sween Lotion (Moisturizing lotion) 1 x Per Week/30 Days Discharge Instructions: Apply moisturizing lotion as directed Prim Dressing: KerraCel Ag Gelling Fiber Dressing, 4x5 in (silver alginate) 1 x Per Week/30 Days ary Discharge Instructions: Apply silver alginate to wound bed as instructed Secondary Dressing: Woven Gauze Sponge, Non-Sterile 4x4 in 1 x Per Week/30 Days Discharge Instructions: Apply over primary dressing as directed. Compression Wrap: ThreePress (3 layer compression wrap) 1 x Per Week/30 Days Discharge Instructions: Apply three layer compression as directed. Wound #8 - Lower Leg Wound Laterality: Left, Posterior Cleanser: Soap and Water 1 x Per Week/30 Days Discharge Instructions: May shower and wash wound with dial antibacterial soap and water prior to dressing change. Cleanser: Wound Cleanser 1 x Per Week/30 Days Discharge Instructions: Cleanse the wound with wound cleanser prior to applying a clean dressing using gauze sponges, not tissue or cotton balls. Peri-Wound Care: Triamcinolone 15 (g) 1 x Per Week/30 Days Discharge Instructions: Use triamcinolone 15 (g) as directed Peri-Wound Care: Sween Lotion (Moisturizing lotion) 1 x Per Week/30 Days Discharge Instructions: Apply moisturizing lotion as directed Prim Dressing: KerraCel Ag Gelling Fiber Dressing, 4x5 in (silver alginate) 1 x Per Week/30 Days ary Discharge Instructions: Apply silver alginate to wound bed as instructed Secondary Dressing:  Woven Gauze Sponge, Non-Sterile 4x4 in 1 x Per Week/30 Days Discharge Instructions: Apply over primary dressing as directed. Compression Wrap: ThreePress (3 layer compression wrap) 1 x Per Week/30 Days Discharge Instructions: Apply three layer compression as directed. Wound #9 - Lower Leg Wound Laterality: Left, Anterior Cleanser: Soap and Water 1 x Per Week/30 Days Discharge Instructions: May shower and wash wound with dial antibacterial soap and water prior to dressing change. Cleanser: Wound Cleanser 1 x Per Week/30 Days Discharge Instructions: Cleanse the wound with wound cleanser prior to applying a clean dressing using gauze sponges, not tissue or cotton balls. Peri-Wound Care: Triamcinolone 15 (g) 1 x Per Week/30 Days Discharge Instructions: Use triamcinolone 15 (g) as directed Peri-Wound Care: Sween Lotion (Moisturizing lotion) 1 x Per Week/30 Days Discharge Instructions: Apply moisturizing lotion as directed Prim Dressing: KerraCel Ag Gelling Fiber Dressing, 4x5 in (silver alginate) 1 x Per Week/30 Days ary Discharge Instructions: Apply silver alginate to wound bed as instructed Secondary Dressing: Woven Gauze Sponge, Non-Sterile 4x4 in 1 x Per Week/30 Days Discharge Instructions: Apply over primary dressing as directed. Compression Wrap: ThreePress (3 layer compression wrap) 1 x Per Week/30 Days Vanessa Palmer, Vanessa Palmer ([id number]) [phone].pdf Page 4 of 9 Discharge Instructions: Apply three layer compression as directed. Electronic Signature(s) Signed: 01/10/2022 9:20:09 AM By: Geralyn Corwin DO Previous Signature: 01/08/2022 4:10:29 PM Version By: Fonnie Mu RN Previous Signature: 01/09/2022 6:10:28 PM Version By: Geralyn Corwin DO Entered By: Geralyn Corwin on 01/10/2022 09:15:23 -------------------------------------------------------------------------------- Problem List Details Patient Name: Date of Service: Vanessa Palmer, Vanessa Palmer. 01/08/2022  2:45 PM Medical Record Number: [id number] Patient Account Number: [id number] Date of Birth/Sex: Treating RN: [birth year] (71 y.o. F) Primary Care Provider: Harriet Masson Other Clinician: Referring Provider: Treating Provider/Extender: Irineo Axon in Treatment: 8 Active Problems ICD-10 Encounter Code Description Active Date MDM Diagnosis I89.0 Lymphedema, not elsewhere classified 11/09/2021 No Yes L97.818 Non-pressure chronic ulcer of other part of right lower leg with other specified 11/09/2021 No Yes severity L97.828 Non-pressure chronic ulcer of other part of left lower leg with other specified 11/09/2021 No Yes severity I87.303 Chronic  venous hypertension (idiopathic) without complications of bilateral 11/09/2021 No Yes lower extremity Inactive Problems Resolved Problems Electronic Signature(s) Signed: 01/10/2022 9:20:09 AM By: Geralyn Corwin DO Entered By: Geralyn Corwin on 01/10/2022 09:11:56 -------------------------------------------------------------------------------- Progress Note Details Patient Name: Date of Service: Vanessa Rad Palmer. 01/08/2022 2:45 PM Medical Record Number: [id number] Patient Account Number: [id number] Date of Birth/Sex: Treating RN: 02-25-1951 (71 y.o. F) Primary Care Provider: Harriet Masson Other Clinician: Referring Provider: Treating Provider/Extender: Vanessa Palmer, Vanessa Palmer ([id number]) [phone].pdf Page 5 of 9 Weeks in Treatment: 8 Subjective Chief Complaint Information obtained from Patient 09/07/2021; scattered open wounds to the lower extremities bilaterally 11/09/21; Patient returns to clinic with recurrent small weeping areas on her bilateral lower legs History of Present Illness (HPI) Admission 09/07/2021 [name] is a 71 year old female with a past medical history of essential hypertension, chronic kidney disease stage III, and  lymphedema that presents to the clinic for a 2-week history of scattered open wounds to her lower extremities bilaterally. She states this has been an ongoing issue for the past year. She states that the wounds wax and wane in healing. She currently takes torsemide. She does not wear compression stockings. She currently denies signs of infection. 7/20; patient presents for follow-up. She received 1 juxta lite compression wrap in the mail. She was supposed to receive 2. She states she talked to the company and they are sending her her second pair. We used antibiotic ointment and silver alginate under 3 layer compression at last clinic visit. The silver alginate stuck to the wound bed. The left lower extremity wounds have healed. 7/27; patient presents for follow-up. She still has not received her juxta lite compression for the right leg. We spoke with the wound care supply company today and they stated it will arrive today. We have been using Xeroform under 3 layer compression to the right lower extremity. She has been using the juxta lite compression to the left lower extremity. Her wounds remain healed to the left lower extremity. READMISSION 11/09/2021 This is a patient that we had in clinic in July. She has lymphedema secondary to chronic venous insufficiency and had scattered open areas on her bilateral lower extremities she received 3 layer compression bilaterally the area is closed over and she was prescribed juxta lite stockings. She tells me she was compliant with the juxta lites, apparently she has home health aides to help her put these on. About 3 weeks ago she noted increase in swelling and weeping areas in her bilateral lower legs and she is in for review of this. She does not have arterial issues her ABIs in the clinic last time were quite normal bilaterally. She does have chronic kidney disease stage III and lumbar radiculopathy. 9/21; patient presents for follow-up. She presented  last week with open wounds to her legs bilaterally. Silver alginate under 3 layer compression was used. Her left lower extremity wounds have healed. She has a juxta light compression at home. She still has open wounds on the right lower extremity. She denies signs of infection. She tolerated the compression wraps well. 9/28; as I understand things we actually healed out her left leg last week and transitioned her to juxta lites however almost immediately fluid ripped out of several open areas on the left leg. She comes in today with significant bilateral lower extremity edema. 10/5; patient presents for follow-up. Her left leg has healed again. She states she has her juxta lite compression wraps at home. We have  been using silver alginate with 3 layer compression to the right lower extremity. She has no issues or complaints today. 10/12; patient presents for follow-up. We will wrapped her right leg with silver alginate under 3 layer compression at last clinic visit. Her left lower extremity wounds had healed and she was using her juxta lite compression wraps. Unfortunately she has reopened wounds to this leg. 10/26; patient presents for follow-up. She has been without compression wraps since Monday. She went for her venous reflux studies however could not tolerate the ultrasound probe pressing against her leg. They were able to state that there was no DVT We have been using silver alginate under 3 layer . compression. 11/15; patient presents for follow-up. At last nurse visit she did not have open wounds and was placed in her juxta lite compression. She did not come in wearing these. She has a few new scattered open areas limited to skin breakdown T the lower extremities bilaterally. o Patient History Information obtained from Chart. Family History Diabetes - Siblings, Heart Disease - Father,Siblings. Social History Never smoker, Alcohol Use - Rarely - sober since 2006. Medical  History Hematologic/Lymphatic Patient has history of Anemia Respiratory Patient has history of Asthma, Sleep Apnea - 2018- oCPAP Cardiovascular Patient has history of Congestive Heart Failure, Coronary Artery Disease, Deep Vein Thrombosis - 2014 both legs, Hypertension, Myocardial Infarction - 2005 Musculoskeletal Patient has history of Gout, Osteoarthritis Neurologic Patient has history of Seizure Disorder - as a teenager Hospitalization/Surgery History - heart cath 2018, 2003, 2004, 2006. - right total hip replacement 2016. - hysterectomy. Medical A Surgical History Notes Palmer Constitutional Symptoms (General Health) chronic back pain Migraine last 2018 Cardiovascular hyperlipidemia Gastrointestinal GERD Genitourinary stage III CKD Musculoskeletal DJD Vanessa Palmer, Vanessa Palmer ([id number]) [id number] Page 6 of 9 Psychiatric depression Objective Constitutional respirations regular, non-labored and within target range for patient.. Vitals Time Taken: 3:13 PM, Temperature: 97.9 F, Pulse: 66 bpm, Respiratory Rate: 16 breaths/min, Blood Pressure: 149/94 mmHg. Cardiovascular 2+ dorsalis pedis/posterior tibialis pulses. Psychiatric pleasant and cooperative. General Notes: Scattered wounds to the lower extremities bilaterally limited to skin breakdown. No signs of surrounding infection. 2+ pitting edema to the knees. Integumentary (Hair, Skin) Wound #6 status is Open. Original cause of wound was Blister. The date acquired was: 10/23/2021. The wound has been in treatment 8 weeks. The wound is located on the Right,Medial Lower Leg. The wound measures 1cm length x 0.2cm width x 0.1cm depth; 0.157cm^2 area and 0.016cm^3 volume. There is no tunneling or undermining noted. There is a small amount of drainage noted. The wound margin is distinct with the outline attached to the wound base. There is large (67-100%) red granulation within the wound bed. There is no necrotic  tissue within the wound bed. The periwound skin appearance did not exhibit: Callus, Crepitus, Excoriation, Induration, Rash, Scarring, Dry/Scaly, Maceration, Atrophie Blanche, Cyanosis, Ecchymosis, Hemosiderin Staining, Mottled, Pallor, Rubor, Erythema. Wound #8 status is Open. Original cause of wound was Gradually Appeared. The date acquired was: 12/05/2021. The wound has been in treatment 4 weeks. The wound is located on the Left,Posterior Lower Leg. The wound measures 0.1cm length x 0.1cm width x 0.1cm depth; 0.008cm^2 area and 0.001cm^3 volume. There is no tunneling or undermining noted. There is a none present amount of drainage noted. The wound margin is distinct with the outline attached to the wound base. There is no granulation within the wound bed. There is no necrotic tissue within the wound bed. The periwound skin appearance did not exhibit:  Callus, Crepitus, Excoriation, Induration, Rash, Scarring, Dry/Scaly, Maceration, Atrophie Blanche, Cyanosis, Ecchymosis, Hemosiderin Staining, Mottled, Pallor, Rubor, Erythema. Wound #9 status is Open. Original cause of wound was Gradually Appeared. The date acquired was: 12/05/2021. The wound has been in treatment 4 weeks. The wound is located on the Left,Anterior Lower Leg. The wound measures 0.1cm length x 0.1cm width x 0.1cm depth; 0.008cm^2 area and 0.001cm^3 volume. There is no tunneling or undermining noted. There is a none present amount of drainage noted. The wound margin is distinct with the outline attached to the wound base. There is no granulation within the wound bed. There is no necrotic tissue within the wound bed. Assessment Active Problems ICD-10 Lymphedema, not elsewhere classified Non-pressure chronic ulcer of other part of right lower leg with other specified severity Non-pressure chronic ulcer of other part of left lower leg with other specified severity Chronic venous hypertension (idiopathic) without complications of  bilateral lower extremity Patient has small scattered open wounds to her lower extremities bilaterally. It appears as if she has been scratching her legs. She has not been wearing her juxta lite compression wraps. I recommend at this time using silver alginate under 3 layer compression to her lower extremities bilaterally. We discussed that when her wounds close she will need compression therapy in order to prevent future wounds. She expressed understanding. Follow-up in 1 week. Procedures Wound #6 Pre-procedure diagnosis of Wound #6 is a Lymphedema located on the Right,Medial Lower Leg . There was a Three Layer Compression Therapy Procedure by Fonnie Mu, RN. Post procedure Diagnosis Wound #6: Same as Pre-Procedure Wound #8 Pre-procedure diagnosis of Wound #8 is a Lymphedema located on the Left,Posterior Lower Leg . There was a Three Layer Compression Therapy Procedure by Fonnie Mu, RN. Post procedure Diagnosis Wound #8: Same as Pre-Procedure Wound #9 Vanessa Palmer, Vanessa Palmer ([id number]) [id number] Page 7 of 9 Pre-procedure diagnosis of Wound #9 is a Lymphedema located on the Left,Anterior Lower Leg . There was a Three Layer Compression Therapy Procedure by Fonnie Mu, RN. Post procedure Diagnosis Wound #9: Same as Pre-Procedure Plan Follow-up Appointments: Return Appointment in 1 week. - w/ Dr. Mikey Bussing Anesthetic: (In clinic) Topical Lidocaine 5% applied to wound bed (In clinic) Topical Lidocaine 4% applied to wound bed Bathing/ Shower/ Hygiene: May shower with protection but do not get wound dressing(s) wet. - Can use cast protector bag from [facility], [facility] or [employer] Edema Control - Lymphedema / SCD / Other: Elevate legs to the level of the heart or above for 30 minutes daily and/or when sitting, a frequency of: - throughout the day Avoid standing for long periods of time. Other Edema Control Orders/Instructions: - We will try and see about  ordering lymphedema pumps after you've had at least 4 weeks of wearing compression wraps. WOUND #6: - Lower Leg Wound Laterality: Right, Medial Cleanser: Soap and Water 1 x Per Week/30 Days Discharge Instructions: May shower and wash wound with dial antibacterial soap and water prior to dressing change. Cleanser: Wound Cleanser 1 x Per Week/30 Days Discharge Instructions: Cleanse the wound with wound cleanser prior to applying a clean dressing using gauze sponges, not tissue or cotton balls. Peri-Wound Care: Triamcinolone 15 (g) 1 x Per Week/30 Days Discharge Instructions: Use triamcinolone 15 (g) as directed Peri-Wound Care: Sween Lotion (Moisturizing lotion) 1 x Per Week/30 Days Discharge Instructions: Apply moisturizing lotion as directed Prim Dressing: KerraCel Ag Gelling Fiber Dressing, 4x5 in (silver alginate) 1 x Per Week/30 Days ary Discharge Instructions: Apply silver alginate to  wound bed as instructed Secondary Dressing: Woven Gauze Sponge, Non-Sterile 4x4 in 1 x Per Week/30 Days Discharge Instructions: Apply over primary dressing as directed. Com pression Wrap: ThreePress (3 layer compression wrap) 1 x Per Week/30 Days Discharge Instructions: Apply three layer compression as directed. WOUND #8: - Lower Leg Wound Laterality: Left, Posterior Cleanser: Soap and Water 1 x Per Week/30 Days Discharge Instructions: May shower and wash wound with dial antibacterial soap and water prior to dressing change. Cleanser: Wound Cleanser 1 x Per Week/30 Days Discharge Instructions: Cleanse the wound with wound cleanser prior to applying a clean dressing using gauze sponges, not tissue or cotton balls. Peri-Wound Care: Triamcinolone 15 (g) 1 x Per Week/30 Days Discharge Instructions: Use triamcinolone 15 (g) as directed Peri-Wound Care: Sween Lotion (Moisturizing lotion) 1 x Per Week/30 Days Discharge Instructions: Apply moisturizing lotion as directed Prim Dressing: KerraCel Ag Gelling Fiber  Dressing, 4x5 in (silver alginate) 1 x Per Week/30 Days ary Discharge Instructions: Apply silver alginate to wound bed as instructed Secondary Dressing: Woven Gauze Sponge, Non-Sterile 4x4 in 1 x Per Week/30 Days Discharge Instructions: Apply over primary dressing as directed. Com pression Wrap: ThreePress (3 layer compression wrap) 1 x Per Week/30 Days Discharge Instructions: Apply three layer compression as directed. WOUND #9: - Lower Leg Wound Laterality: Left, Anterior Cleanser: Soap and Water 1 x Per Week/30 Days Discharge Instructions: May shower and wash wound with dial antibacterial soap and water prior to dressing change. Cleanser: Wound Cleanser 1 x Per Week/30 Days Discharge Instructions: Cleanse the wound with wound cleanser prior to applying a clean dressing using gauze sponges, not tissue or cotton balls. Peri-Wound Care: Triamcinolone 15 (g) 1 x Per Week/30 Days Discharge Instructions: Use triamcinolone 15 (g) as directed Peri-Wound Care: Sween Lotion (Moisturizing lotion) 1 x Per Week/30 Days Discharge Instructions: Apply moisturizing lotion as directed Prim Dressing: KerraCel Ag Gelling Fiber Dressing, 4x5 in (silver alginate) 1 x Per Week/30 Days ary Discharge Instructions: Apply silver alginate to wound bed as instructed Secondary Dressing: Woven Gauze Sponge, Non-Sterile 4x4 in 1 x Per Week/30 Days Discharge Instructions: Apply over primary dressing as directed. Com pression Wrap: ThreePress (3 layer compression wrap) 1 x Per Week/30 Days Discharge Instructions: Apply three layer compression as directed. 1. Silver alginate under 3 layer compression to the lower extremities bilaterally 2. Follow-up in 1 week Electronic Signature(s) Signed: 01/10/2022 9:20:09 AM By: Geralyn Corwin DO Entered By: Geralyn Corwin on 01/10/2022 09:19:37 Buttler, Meeka Palmer ([id number]) [id number] Page 8 of  9 -------------------------------------------------------------------------------- HxROS Details Patient Name: Date of Service: Vanessa Palmer, Vanessa Palmer 01/08/2022 2:45 PM Medical Record Number: [id number] Patient Account Number: [id number] Date of Birth/Sex: Treating RN: 06-08-1950 (71 y.o. F) Primary Care Provider: Harriet Masson Other Clinician: Referring Provider: Treating Provider/Extender: Irineo Axon in Treatment: 8 Information Obtained From Chart Constitutional Symptoms (General Health) Medical History: Past Medical History Notes: chronic back pain Migraine last 2018 Hematologic/Lymphatic Medical History: Positive for: Anemia Respiratory Medical History: Positive for: Asthma; Sleep Apnea - 2018- oCPAP Cardiovascular Medical History: Positive for: Congestive Heart Failure; Coronary Artery Disease; Deep Vein Thrombosis - 2014 both legs; Hypertension; Myocardial Infarction - 2005 Past Medical History Notes: hyperlipidemia Gastrointestinal Medical History: Past Medical History Notes: GERD Genitourinary Medical History: Past Medical History Notes: stage III CKD Musculoskeletal Medical History: Positive for: Gout; Osteoarthritis Past Medical History Notes: DJD Neurologic Medical History: Positive for: Seizure Disorder - as a teenager Psychiatric Medical History: Past Medical History Notes: depression Immunizations Pneumococcal Vaccine: Received  Pneumococcal Vaccination: No Implantable Devices No devices added Hospitalization / Surgery History Type of Hospitalization/Surgery heart cath 2018, 2003, 2004, 2006 right total hip replacement 2016 Vanessa MuseSIMMONS, Jermia Palmer ([id number]) [phone] [id number] Page 9 of 9 hysterectomy Family and Social History Diabetes: Yes - Siblings; Heart Disease: Yes - Father,Siblings; Never smoker; Alcohol Use: Rarely - sober since 2006 Electronic Signature(s) Signed: 01/10/2022 9:20:09 AM  By: Geralyn CorwinHoffman, Romelo Sciandra DO Entered By: Geralyn CorwinHoffman, Camya Haydon on 01/10/2022 09:14:15 -------------------------------------------------------------------------------- SuperBill Details Patient Name: Date of Service: Vanessa CroftsSIMMO NS, Vanessa Palmer. 01/08/2022 Medical Record Number: [id number] Patient Account Number: [id number] Date of Birth/Sex: Treating RN: 03/31/1950 [age] y.o. Ardis RowanF) Breedlove, Lauren Primary Care Provider: Harriet MassonMaldonado, Edgar Other Clinician: Referring Provider: Treating Provider/Extender: Irineo AxonHoffman, Akiyah Palmer Maldonado, Edgar Weeks in Treatment: 8 Diagnosis Coding ICD-10 Codes Code Description I89.0 Lymphedema, not elsewhere classified L97.818 Non-pressure chronic ulcer of other part of right lower leg with other specified severity L97.828 Non-pressure chronic ulcer of other part of left lower leg with other specified severity I87.303 Chronic venous hypertension (idiopathic) without complications of bilateral lower extremity Facility Procedures : CPT4: Code [id number] Description: 81 BILATERAL: Application of multi-layer venous compression system; leg (below knee), including ankle and t. Modifier: Quantity: 1 Physician Procedures : CPT4 Code Description Modifier [id number] - WC PHYS LEVEL 3 - EST PT ICD-10 Diagnosis Description L97.818 Non-pressure chronic ulcer of other part of right lower leg with other specified severity L97.828 Non-pressure chronic ulcer of other part of  left lower leg with other specified severity I89.0 Lymphedema, not elsewhere classified I87.303 Chronic venous hypertension (idiopathic) without complications of bilateral lower extremity Quantity: 1 Electronic Signature(s) Signed: 01/10/2022 9:20:09 AM By: Geralyn CorwinHoffman, Agostino Gorin DO Previous Signature: 01/09/2022 6:47:49 PM Version By: Geralyn CorwinHoffman, Matthew Pais DO Previous Signature: 01/08/2022 4:10:29 PM Version By: Fonnie MuBreedlove, Lauren RN Previous Signature: 01/09/2022 6:10:28 PM Version By: Geralyn CorwinHoffman, Aaralyn Kil DO Entered By: Geralyn CorwinHoffman,  Miracle Mongillo on 01/10/2022 09:19:44

## 2022-01-10 NOTE — Progress Notes (Addendum)
Carsey, Shontay D ([id number]) [id number] Page 1 of 13 Visit Report for 01/08/2022 Arrival Information Details Patient Name: Date of Service: SIMMO NS, Imoni D. 01/08/2022 2:45 PM Medical Record Number: [id number] Patient Account Number: [id number] Date of Birth/Sex: Treating RN: 12/10/1950 (71 y.o. F) Deaton, Bobbi Primary Care : Maldonado, Edgar Other Clinician: Referring : Treating /Extender: Hoffman, Jessica Maldonado, Edgar Weeks in Treatment: 8 Visit Information History Since Last Visit Added or deleted any medications: No Patient Arrived: Wheel Chair Any new allergies or adverse reactions: No Arrival Time: 15:12 Had a fall or experienced change in No Accompanied By: daughter activities of daily living that may affect Transfer Assistance: None risk of falls: Patient Identification Verified: Yes Signs or symptoms of abuse/neglect since last visito No Secondary Verification Process Completed: Yes Hospitalized since last visit: No Patient Requires Transmission-Based Precautions: No Implantable device outside of the clinic excluding No Patient Has Alerts: Yes cellular tissue based products placed in the center Patient Alerts: Patient on Blood Thinner since last visit: ABI's= R 1.05 L1.16 Has Compression in Place as Prescribed: No Pain Present Now: No Electronic Signature(s) Signed: 01/09/2022 5:53:09 PM By: Deaton, Bobbi RN, BSN Entered By: Deaton, Bobbi on 01/08/2022 15:13:17 -------------------------------------------------------------------------------- Compression Therapy Details Patient Name: Date of Service: SIMMO NS, Kendalynn D. 01/08/2022 2:45 PM Medical Record Number: [id number] Patient Account Number: [id number] Date of Birth/Sex: Treating RN: 04/08/1950 (71 y.o. F) Breedlove, Lauren Primary Care : Maldonado, Edgar Other Clinician: Referring : Treating /Extender: Hoffman, Jessica Maldonado,  Edgar Weeks in Treatment: 8 Compression Therapy Performed for Wound Assessment: Wound #6 Right,Medial Lower Leg Performed By: Clinician Breedlove, Lauren, RN Compression Type: Three Layer Post Procedure Diagnosis Same as Pre-procedure Electronic Signature(s) Signed: 01/08/2022 4:10:29 PM By: Breedlove, Lauren RN Entered By: Breedlove, Lauren on 01/08/2022 15:51:01 Dress, Jodye D ([id number]) [id number] Page 2 of 13 -------------------------------------------------------------------------------- Compression Therapy Details Patient Name: Date of Service: SIMMO NS, Alaiza D. 01/08/2022 2:45 PM Medical Record Number: [id number] Patient Account Number: [id number] Date of Birth/Sex: Treating RN: 08/10/1950 (71 y.o. F) Breedlove, Lauren Primary Care : Maldonado, Edgar Other Clinician: Referring : Treating /Extender: Hoffman, Jessica Maldonado, Edgar Weeks in Treatment: 8 Compression Therapy Performed for Wound Assessment: Wound #8 Left,Posterior Lower Leg Performed By: Clinician Breedlove, Lauren, RN Compression Type: Three Layer Post Procedure Diagnosis Same as Pre-procedure Electronic Signature(s) Signed: 01/08/2022 4:10:29 PM By: Breedlove, Lauren RN Entered By: Breedlove, Lauren on 01/08/2022 15:51:01 -------------------------------------------------------------------------------- Compression Therapy Details Patient Name: Date of Service: SIMMO NS, Janai D. 01/08/2022 2:45 PM Medical Record Number: [id number] Patient Account Number: [id number] Date of Birth/Sex: Treating RN: 04/24/1950 (71 y.o. F) Breedlove, Lauren Primary Care : Maldonado, Edgar Other Clinician: Referring : Treating /Extender: Hoffman, Jessica Maldonado, Edgar Weeks in Treatment: 8 Compression Therapy Performed for Wound Assessment: Wound #9 Left,Anterior Lower Leg Performed By: Clinician Breedlove, Lauren, RN Compression Type: Three  Layer Post Procedure Diagnosis Same as Pre-procedure Electronic Signature(s) Signed: 01/08/2022 4:10:29 PM By: Breedlove, Lauren RN Entered By: Breedlove, Lauren on 01/08/2022 15:51:01 -------------------------------------------------------------------------------- Encounter Discharge Information Details Patient Name: Date of Service: SIMMO NS, Maysoon D. 01/08/2022 2:45 PM Medical Record Number: [id number] Patient Account Number: [id number] Date of Birth/Sex: Treating RN: 10/03/1950 (71 y.o. F) Breedlove, Lauren Primary Care : Maldonado, Edgar Other Clinician: Referring : Treating /Extender: Hoffman, Jessica Maldonado, Edgar Weeks in Treatment: 8 Encounter Discharge Information Items Discharge Condition: Stable Ambulatory Status: Ambulatory Discharge Destination: Home Transportation: Private Auto Accompanied By: daughter Schedule Follow-up Appointment: Yes Clinical Summary of Care: Patient Declined Laminack, Myliah D ([id number]) [id number]   Page 3 of 13 Electronic Signature(s) Signed: 01/08/2022 4:10:29 PM By: Rhae Hammock RN Entered By: Rhae Hammock on 01/08/2022 15:56:55 -------------------------------------------------------------------------------- Lower Extremity Assessment Details Patient Name: Date of Service: [name] 01/08/2022 2:45 PM Medical Record Number: [id number] Patient Account Number: [id number] Date of Birth/Sex: Treating RN: 09-25-1950 (71 y.o. [name] Primary Care Georgine Wiltse: Maudry Mayhew Other Clinician: Referring Elandra Powell: Treating Lorella Gomez/Extender: Theressa Millard in Treatment: 8 Edema Assessment Assessed: [name]: Yes] [name]: Yes] Edema: [Left: Yes] [Right: Yes] Calf Left: Right: Point of Measurement: 28 cm From Medial Instep 51 cm 55 cm Ankle Left: Right: Point of Measurement: 9 cm From Medial Instep 29 cm 29 cm Vascular  Assessment Pulses: Dorsalis Pedis Palpable: [Left:Yes] [Right:Yes] Electronic Signature(s) Signed: 01/09/2022 5:53:09 PM By: Deon Pilling RN, BSN Entered By: Deon Pilling on 01/08/2022 15:15:29 -------------------------------------------------------------------------------- Multi Wound Chart Details Patient Name: Date of Service: [name] 01/08/2022 2:45 PM Medical Record Number: [id number] Patient Account Number: [id number] Date of Birth/Sex: Treating RN: 1950-07-11 (71 y.o. F) Primary Care Amonie Wisser: Maudry Mayhew Other Clinician: Referring Avey Mcmanamon: Treating Notnamed Croucher/Extender: Theressa Millard in Treatment: 8 Vital Signs Height(in): Pulse(bpm): 66 Weight(lbs): Blood Pressure(mmHg): 149/94 Body Mass Index(BMI): Temperature(F): 97.9 Respiratory Rate(breaths/min): 16 [6:Photos:] [[id number] Page 4 of 13] Right, Medial Lower Leg Left, Posterior Lower Leg Left, Anterior Lower Leg Wound Location: Blister Gradually Appeared Gradually Appeared Wounding Event: Lymphedema Lymphedema Lymphedema Primary Etiology: Anemia, Asthma, Sleep Apnea, Anemia, Asthma, Sleep Apnea, Anemia, Asthma, Sleep Apnea, Comorbid History: Congestive Heart Failure, Coronary Congestive Heart Failure, Coronary Congestive Heart Failure, Coronary Artery Disease, Deep Vein Artery Disease, Deep Vein Artery Disease, Deep Vein Thrombosis, Hypertension, Myocardial Thrombosis, Hypertension, Myocardial Thrombosis, Hypertension, Myocardial Infarction, Gout, Osteoarthritis, SeizureInfarction, Gout, Osteoarthritis, SeizureInfarction, Gout, Osteoarthritis, Seizure Disorder Disorder Disorder 10/23/2021 12/05/2021 12/05/2021 Date Acquired: _0 Weeks of Treatment: Open Open Open Wound Status: No No No Wound Recurrence: 1x0.2x0.1 0.1x0.1x0.1 0.1x0.1x0.1 Measurements L x W x D (cm) 0.157 0.008 0.008 A (cm) : rea 0.016 0.001 0.001 Volume (cm)  : 0.00% 99.40% 99.40% % Reduction in Area: 0.00% 99.20% 99.20% % Reduction in Volume: Full Thickness Without Exposed Full Thickness Without Exposed Full Thickness Without Exposed Classification: Support Structures Support Structures Support Structures Small None Present None Present Exudate Amount: Distinct, outline attached Distinct, outline attached Distinct, outline attached Wound Margin: Large (67-100%) None Present (0%) None Present (0%) Granulation Amount: Red N/A N/A Granulation Quality: None Present (0%) None Present (0%) None Present (0%) Necrotic Amount: Fascia: No Fascia: No Fascia: No Exposed Structures: Fat Layer (Subcutaneous Tissue): No Fat Layer (Subcutaneous Tissue): No Fat Layer (Subcutaneous Tissue): No Tendon: No Tendon: No Tendon: No Muscle: No Muscle: No Muscle: No Joint: No Joint: No Joint: No Bone: No Bone: No Bone: No Large (67-100%) Large (67-100%) Small (1-33%) Epithelialization: Excoriation: No Excoriation: No Periwound Skin Texture: Induration: No Induration: No Callus: No Callus: No Crepitus: No Crepitus: No Rash: No Rash: No Scarring: No Scarring: No Maceration: No Maceration: No Periwound Skin Moisture: Dry/Scaly: No Dry/Scaly: No Atrophie Blanche: No Atrophie Blanche: No Periwound Skin Color: Cyanosis: No Cyanosis: No Ecchymosis: No Ecchymosis: No Erythema: No Erythema: No Hemosiderin Staining: No Hemosiderin Staining: No Mottled: No Mottled: No Pallor: No Pallor: No Rubor: No Rubor: No Compression Therapy Compression Therapy Compression Therapy Procedures Performed: Treatment Notes Wound #6 (Lower Leg) Wound Laterality: Right, Medial Cleanser Soap and Water Discharge Instruction: May shower and wash wound with dial antibacterial soap and water prior to dressing change. Wound  Cleanser Discharge Instruction: Cleanse the wound with wound cleanser prior to applying a clean dressing using gauze sponges, not  tissue or cotton balls. Peri-Wound Care Triamcinolone 15 (g) Discharge Instruction: Use triamcinolone 15 (g) as directed Sween Lotion (Moisturizing lotion) Discharge Instruction: Apply moisturizing lotion as directed Topical Primary Dressing KerraCel Ag Gelling Fiber Dressing, 4x5 in (silver alginate) Discharge Instruction: Apply silver alginate to wound bed as instructed Secondary Dressing Woven Gauze Sponge, Non-Sterile 4x4 in Discharge Instruction: Apply over primary dressing as directed. Zanella, Elvin D ([id number]) [id number] Page 5 of 13 Secured With Compression Wrap ThreePress (3 layer compression wrap) Discharge Instruction: Apply three layer compression as directed. Compression Stockings Add-Ons Wound #8 (Lower Leg) Wound Laterality: Left, Posterior Cleanser Soap and Water Discharge Instruction: May shower and wash wound with dial antibacterial soap and water prior to dressing change. Wound Cleanser Discharge Instruction: Cleanse the wound with wound cleanser prior to applying a clean dressing using gauze sponges, not tissue or cotton balls. Peri-Wound Care Triamcinolone 15 (g) Discharge Instruction: Use triamcinolone 15 (g) as directed Sween Lotion (Moisturizing lotion) Discharge Instruction: Apply moisturizing lotion as directed Topical Primary Dressing KerraCel Ag Gelling Fiber Dressing, 4x5 in (silver alginate) Discharge Instruction: Apply silver alginate to wound bed as instructed Secondary Dressing Woven Gauze Sponge, Non-Sterile 4x4 in Discharge Instruction: Apply over primary dressing as directed. Secured With Compression Wrap ThreePress (3 layer compression wrap) Discharge Instruction: Apply three layer compression as directed. Compression Stockings Add-Ons Wound #9 (Lower Leg) Wound Laterality: Left, Anterior Cleanser Soap and Water Discharge Instruction: May shower and wash wound with dial antibacterial soap and water prior  to dressing change. Wound Cleanser Discharge Instruction: Cleanse the wound with wound cleanser prior to applying a clean dressing using gauze sponges, not tissue or cotton balls. Peri-Wound Care Triamcinolone 15 (g) Discharge Instruction: Use triamcinolone 15 (g) as directed Sween Lotion (Moisturizing lotion) Discharge Instruction: Apply moisturizing lotion as directed Topical Primary Dressing KerraCel Ag Gelling Fiber Dressing, 4x5 in (silver alginate) Discharge Instruction: Apply silver alginate to wound bed as instructed Secondary Dressing Woven Gauze Sponge, Non-Sterile 4x4 in Discharge Instruction: Apply over primary dressing as directed. Secured With Compression Wrap ThreePress (3 layer compression wrap) Discharge Instruction: Apply three layer compression as directed. Septer, Wilhelmina D ([id number]) [id number] Page 6 of 13 Compression Stockings Add-Ons Electronic Signature(s) Signed: 01/10/2022 9:20:09 AM By: Hoffman, Jessica DO Entered By: Hoffman, Jessica on 01/10/2022 09:12:02 -------------------------------------------------------------------------------- Multi-Disciplinary Care Plan Details Patient Name: Date of Service: SIMMO NS, Camauri D. 01/08/2022 2:45 PM Medical Record Number: [id number] Patient Account Number: [id number] Date of Birth/Sex: Treating RN: 11/14/1950 (71 y.o. F) Breedlove, Lauren Primary Care : Maldonado, Edgar Other Clinician: Referring : Treating /Extender: Hoffman, Jessica Maldonado, Edgar Weeks in Treatment: 8 Active Inactive Venous Leg Ulcer Nursing Diagnoses: Actual venous Insuffiency (use after diagnosis is confirmed) Goals: Patient will maintain optimal edema control Date Initiated: 11/09/2021 Target Resolution Date: 01/04/2022 Goal Status: Active Interventions: Assess peripheral edema status every visit. Compression as ordered Provide education on venous insufficiency Treatment  Activities: Therapeutic compression applied : 11/09/2021 Notes: Wound/Skin Impairment Nursing Diagnoses: Impaired tissue integrity Goals: Patient/caregiver will verbalize understanding of skin care regimen Date Initiated: 11/09/2021 Target Resolution Date: 01/04/2022 Goal Status: Active Ulcer/skin breakdown will have a volume reduction of 30% by week 4 Date Initiated: 11/09/2021 Date Inactivated: 12/07/2021 Target Resolution Date: 12/06/2021 Goal Status: Met Interventions: Assess patient/caregiver ability to obtain necessary supplies Assess patient/caregiver ability to perform ulcer/skin care regimen upon admission and as needed Assess ulceration(s) every visit Provide   education on ulcer and skin care Treatment Activities: Topical wound management initiated : 11/09/2021 Notes: Pless, Aracely D ([id number]) [id number] Page 7 of 13 Electronic Signature(s) Signed: 01/08/2022 4:10:29 PM By: Breedlove, Lauren RN Entered By: Breedlove, Lauren on 01/08/2022 15:51:22 -------------------------------------------------------------------------------- Pain Assessment Details Patient Name: Date of Service: SIMMO NS, Elberta D. 01/08/2022 2:45 PM Medical Record Number: [id number] Patient Account Number: [id number] Date of Birth/Sex: Treating RN: 12/14/1950 (71 y.o. F) Deaton, Bobbi Primary Care : Maldonado, Edgar Other Clinician: Referring : Treating /Extender: Hoffman, Jessica Maldonado, Edgar Weeks in Treatment: 8 Active Problems Location of Pain Severity and Description of Pain Patient Has Paino No Site Locations Rate the pain. Current Pain Level: 0 Pain Management and Medication Current Pain Management: Medication: No Cold Application: No Rest: No Massage: No Activity: No T.E.N.S.: No Heat Application: No Leg drop or elevation: No Is the Current Pain Management Adequate: Adequate How does your wound impact your activities of  daily livingo Sleep: No Bathing: No Appetite: No Relationship With Others: No Bladder Continence: No Emotions: No Bowel Continence: No Work: No Toileting: No Drive: No Dressing: No Hobbies: No Electronic Signature(s) Signed: 01/09/2022 5:53:09 PM By: Deaton, Bobbi RN, BSN Entered By: Deaton, Bobbi on 01/08/2022 15:13:46 Patient/Caregiver Education Details -------------------------------------------------------------------------------- Cosman, Hilaria D ([id number]) [id number] Page 8 of 13 Patient Name: Date of Service: SIMMO NS, Blakley D. 11/13/2023andnbsp2:45 PM Medical Record Number: [id number] Patient Account Number: [id number] Date of Birth/Gender: Treating RN: 02/06/1951 (71 y.o. F) Breedlove, Lauren Primary Care Physician: Maldonado, Edgar Other Clinician: Referring Physician: Treating Physician/Extender: Hoffman, Jessica Maldonado, Edgar Weeks in Treatment: 8 Education Assessment Education Provided To: Patient Education Topics Provided Venous: Methods: Explain/Verbal Responses: State content correctly Wound/Skin Impairment: Methods: Explain/Verbal Responses: State content correctly Electronic Signature(s) Signed: 01/08/2022 4:10:29 PM By: Breedlove, Lauren RN Entered By: Breedlove, Lauren on 01/08/2022 15:51:55 -------------------------------------------------------------------------------- Wound Assessment Details Patient Name: Date of Service: SIMMO NS, Shaka D. 01/08/2022 2:45 PM Medical Record Number: [id number] Patient Account Number: [id number] Date of Birth/Sex: Treating RN: 02/07/1951 (71 y.o. F) Deaton, Bobbi Primary Care : Maldonado, Edgar Other Clinician: Referring : Treating /Extender: Hoffman, Jessica Maldonado, Edgar Weeks in Treatment: 8 Wound Status Wound Number: 6 Primary Lymphedema Etiology: Wound Location: Right, Medial Lower Leg Wound Open Wounding Event: Blister Status: Date  Acquired: 10/23/2021 Comorbid Anemia, Asthma, Sleep Apnea, Congestive Heart Failure, Coronary Weeks Of Treatment: 8 History: Artery Disease, Deep Vein Thrombosis, Hypertension, Myocardial Clustered Wound: No Infarction, Gout, Osteoarthritis, Seizure Disorder Photos Wound Measurements Length: (cm) 1 Width: (cm) 0.2 Depth: (cm) 0.1 Area: (cm) 0.157 Volume: (cm) 0.016 Fangman, Saadia D ([id number]) Wound Description Classification: Full Thickness Without Exposed Support Structures Wound Margin: Distinct, outline attached Exudate Amount: Small Foul Odor After Cleansing: No Slough/Fibrino No % Reduction in Area: 0% % Reduction in Volume: 0% Epithelialization: Large (67-100%) Tunneling: No Undermining: No [id number] Page 9 of 13 Wound Bed Granulation Amount: Large (67-100%) Exposed Structure Granulation Quality: Red Fascia Exposed: No Necrotic Amount: None Present (0%) Fat Layer (Subcutaneous Tissue) Exposed: No Tendon Exposed: No Muscle Exposed: No Joint Exposed: No Bone Exposed: No Periwound Skin Texture Texture Color No Abnormalities Noted: No No Abnormalities Noted: No Callus: No Atrophie Blanche: No Crepitus: No Cyanosis: No Excoriation: No Ecchymosis: No Induration: No Erythema: No Rash: No Hemosiderin Staining: No Scarring: No Mottled: No Pallor: No Moisture Rubor: No No Abnormalities Noted: No Dry / Scaly: No Maceration: No Treatment Notes Wound #6 (Lower Leg) Wound Laterality: Right, Medial Cleanser Soap and Water Discharge Instruction: May shower and wash wound   with dial antibacterial soap and water prior to dressing change. Wound Cleanser Discharge Instruction: Cleanse the wound with wound cleanser prior to applying a clean dressing using gauze sponges, not tissue or cotton balls. Peri-Wound Care Triamcinolone 15 (g) Discharge Instruction: Use triamcinolone 15 (g) as directed Sween Lotion (Moisturizing  lotion) Discharge Instruction: Apply moisturizing lotion as directed Topical Primary Dressing KerraCel Ag Gelling Fiber Dressing, 4x5 in (silver alginate) Discharge Instruction: Apply silver alginate to wound bed as instructed Secondary Dressing Woven Gauze Sponge, Non-Sterile 4x4 in Discharge Instruction: Apply over primary dressing as directed. Secured With Compression Wrap ThreePress (3 layer compression wrap) Discharge Instruction: Apply three layer compression as directed. Compression Stockings Add-Ons Electronic Signature(s) Signed: 01/09/2022 5:53:09 PM By: Deaton, Bobbi RN, BSN Entered By: Deaton, Bobbi on 01/08/2022 15:19:58 Minchew, Sahiba D ([id number]) [id number] Page 10 of 13 -------------------------------------------------------------------------------- Wound Assessment Details Patient Name: Date of Service: SIMMO NS, Cortney D. 01/08/2022 2:45 PM Medical Record Number: [id number] Patient Account Number: [id number] Date of Birth/Sex: Treating RN: 04/20/1950 (71 y.o. F) Deaton, Bobbi Primary Care : Maldonado, Edgar Other Clinician: Referring : Treating /Extender: Hoffman, Jessica Maldonado, Edgar Weeks in Treatment: 8 Wound Status Wound Number: 8 Primary Lymphedema Etiology: Wound Location: Left, Posterior Lower Leg Wound Open Wounding Event: Gradually Appeared Status: Date Acquired: 12/05/2021 Comorbid Anemia, Asthma, Sleep Apnea, Congestive Heart Failure, Coronary Weeks Of Treatment: 4 History: Artery Disease, Deep Vein Thrombosis, Hypertension, Myocardial Clustered Wound: No Infarction, Gout, Osteoarthritis, Seizure Disorder Photos Wound Measurements Length: (cm) 0. Width: (cm) 0. Depth: (cm) 0. Area: (cm) 0 Volume: (cm) 0 1 % Reduction in Area: 99.4% 1 % Reduction in Volume: 99.2% 1 Epithelialization: Large (67-100%) .008 Tunneling: No .001 Undermining: No Wound Description Classification: Full  Thickness Without Exposed Support Wound Margin: Distinct, outline attached Exudate Amount: None Present Structures Wound Bed Granulation Amount: None Present (0%) Exposed Structure Necrotic Amount: None Present (0%) Fascia Exposed: No Fat Layer (Subcutaneous Tissue) Exposed: No Tendon Exposed: No Muscle Exposed: No Joint Exposed: No Bone Exposed: No Periwound Skin Texture Texture Color No Abnormalities Noted: No No Abnormalities Noted: No Callus: No Atrophie Blanche: No Crepitus: No Cyanosis: No Excoriation: No Ecchymosis: No Induration: No Erythema: No Rash: No Hemosiderin Staining: No Scarring: No Mottled: No Pallor: No Moisture Rubor: No No Abnormalities Noted: No Dry / Scaly: No Maceration: No Kimberlin, Tandra D ([id number]) [id number] Page 11 of 13 Treatment Notes Wound #8 (Lower Leg) Wound Laterality: Left, Posterior Cleanser Soap and Water Discharge Instruction: May shower and wash wound with dial antibacterial soap and water prior to dressing change. Wound Cleanser Discharge Instruction: Cleanse the wound with wound cleanser prior to applying a clean dressing using gauze sponges, not tissue or cotton balls. Peri-Wound Care Triamcinolone 15 (g) Discharge Instruction: Use triamcinolone 15 (g) as directed Sween Lotion (Moisturizing lotion) Discharge Instruction: Apply moisturizing lotion as directed Topical Primary Dressing KerraCel Ag Gelling Fiber Dressing, 4x5 in (silver alginate) Discharge Instruction: Apply silver alginate to wound bed as instructed Secondary Dressing Woven Gauze Sponge, Non-Sterile 4x4 in Discharge Instruction: Apply over primary dressing as directed. Secured With Compression Wrap ThreePress (3 layer compression wrap) Discharge Instruction: Apply three layer compression as directed. Compression Stockings Add-Ons Electronic Signature(s) Signed: 01/09/2022 5:53:09 PM By: Deaton, Bobbi RN, BSN Signed:  01/10/2022 8:21:58 AM By: Palmer, Carrie RN, BSN Entered By: Palmer, Carrie on 01/08/2022 15:21:11 -------------------------------------------------------------------------------- Wound Assessment Details Patient Name: Date of Service: SIMMO NS, Davinity D. 01/08/2022 2:45 PM Medical Record Number: [id number] Patient Account Number: [id number] Date of Birth/Sex:   Treating RN: 12/17/1950 (71 y.o. F) Deaton, Bobbi Primary Care : Maldonado, Edgar Other Clinician: Referring : Treating /Extender: Hoffman, Jessica Maldonado, Edgar Weeks in Treatment: 8 Wound Status Wound Number: 9 Primary Lymphedema Etiology: Wound Location: Left, Anterior Lower Leg Wound Open Wounding Event: Gradually Appeared Status: Date Acquired: 12/05/2021 Comorbid Anemia, Asthma, Sleep Apnea, Congestive Heart Failure, Coronary Weeks Of Treatment: 4 History: Artery Disease, Deep Vein Thrombosis, Hypertension, Myocardial Clustered Wound: No Infarction, Gout, Osteoarthritis, Seizure Disorder Photos Creasey, Kalyiah D ([id number]) [id number] Page 12 of 13 Wound Measurements Length: (cm) 0.1 Width: (cm) 0.1 Depth: (cm) 0.1 Area: (cm) 0.008 Volume: (cm) 0.001 % Reduction in Area: 99.4% % Reduction in Volume: 99.2% Epithelialization: Small (1-33%) Tunneling: No Undermining: No Wound Description Classification: Full Thickness Without Exposed Support Structures Wound Margin: Distinct, outline attached Exudate Amount: None Present Foul Odor After Cleansing: No Slough/Fibrino No Wound Bed Granulation Amount: None Present (0%) Exposed Structure Necrotic Amount: None Present (0%) Fascia Exposed: No Fat Layer (Subcutaneous Tissue) Exposed: No Tendon Exposed: No Muscle Exposed: No Joint Exposed: No Bone Exposed: No Periwound Skin Texture Texture Color No Abnormalities Noted: No No Abnormalities Noted: No Moisture No Abnormalities Noted: No Treatment  Notes Wound #9 (Lower Leg) Wound Laterality: Left, Anterior Cleanser Soap and Water Discharge Instruction: May shower and wash wound with dial antibacterial soap and water prior to dressing change. Wound Cleanser Discharge Instruction: Cleanse the wound with wound cleanser prior to applying a clean dressing using gauze sponges, not tissue or cotton balls. Peri-Wound Care Triamcinolone 15 (g) Discharge Instruction: Use triamcinolone 15 (g) as directed Sween Lotion (Moisturizing lotion) Discharge Instruction: Apply moisturizing lotion as directed Topical Primary Dressing KerraCel Ag Gelling Fiber Dressing, 4x5 in (silver alginate) Discharge Instruction: Apply silver alginate to wound bed as instructed Secondary Dressing Woven Gauze Sponge, Non-Sterile 4x4 in Discharge Instruction: Apply over primary dressing as directed. Secured With Compression Wrap ThreePress (3 layer compression wrap) Discharge Instruction: Apply three layer compression as directed. Compression Stockings Dolney, Letty D ([id number]) [id number] Page 13 of 13 Add-Ons Electronic Signature(s) Signed: 01/09/2022 5:53:09 PM By: Deaton, Bobbi RN, BSN Signed: 01/10/2022 8:21:58 AM By: Palmer, Carrie RN, BSN Entered By: Palmer, Carrie on 01/08/2022 15:22:07 -------------------------------------------------------------------------------- Vitals Details Patient Name: Date of Service: SIMMO NS, Raffaela D. 01/08/2022 2:45 PM Medical Record Number: [id number] Patient Account Number: [id number] Date of Birth/Sex: Treating RN: 06/04/1950 (71 y.o. F) Deaton, Bobbi Primary Care : Maldonado, Edgar Other Clinician: Referring : Treating /Extender: Hoffman, Jessica Maldonado, Edgar Weeks in Treatment: 8 Vital Signs Time Taken: 15:13 Temperature (°F): 97.9 Pulse (bpm): 66 Respiratory Rate (breaths/min): 16 Blood Pressure (mmHg): 149/94 Reference Range: 80 - 120 mg /  dl Electronic Signature(s) Signed: 01/09/2022 5:53:09 PM By: Deaton, Bobbi RN, BSN Entered By: Deaton, Bobbi on 01/08/2022 15:13:36 

## 2022-01-15 ENCOUNTER — Encounter (HOSPITAL_BASED_OUTPATIENT_CLINIC_OR_DEPARTMENT_OTHER): Payer: Medicare Other | Admitting: Internal Medicine

## 2022-01-15 DIAGNOSIS — I87303 Chronic venous hypertension (idiopathic) without complications of bilateral lower extremity: Secondary | ICD-10-CM | POA: Diagnosis not present

## 2022-01-15 DIAGNOSIS — L97818 Non-pressure chronic ulcer of other part of right lower leg with other specified severity: Secondary | ICD-10-CM

## 2022-01-15 DIAGNOSIS — I890 Lymphedema, not elsewhere classified: Secondary | ICD-10-CM | POA: Diagnosis not present

## 2022-01-15 DIAGNOSIS — N183 Chronic kidney disease, stage 3 unspecified: Secondary | ICD-10-CM | POA: Diagnosis not present

## 2022-01-15 DIAGNOSIS — L97828 Non-pressure chronic ulcer of other part of left lower leg with other specified severity: Secondary | ICD-10-CM | POA: Diagnosis not present

## 2022-01-15 DIAGNOSIS — I129 Hypertensive chronic kidney disease with stage 1 through stage 4 chronic kidney disease, or unspecified chronic kidney disease: Secondary | ICD-10-CM | POA: Diagnosis not present

## 2022-01-15 NOTE — Progress Notes (Addendum)
AIANA, NORDQUIST ([id number]) [id number] Page 1 of 11 Visit Report for 01/15/2022 Arrival Information Details Patient Name: Date of Service: Vanessa Palmer, Vanessa Palmer 01/15/2022 1:45 PM Medical Record Number: [id number] Patient Account Number: [id number] Date of Birth/Sex: Treating RN: Sep 27, 1950 (71 y.o. F) Primary Care Keola Heninger: Harriet Masson Other Clinician: Referring Joseandres Mazer: Treating Osamah Schmader/Extender: Irineo Axon in Treatment: 9 Visit Information History Since Last Visit Added or deleted any medications: No Patient Arrived: Wheel Chair Any new allergies or adverse reactions: No Arrival Time: 13:39 Had a fall or experienced change in No Accompanied By: daughter activities of daily living that may affect Transfer Assistance: None risk of falls: Patient Identification Verified: Yes Signs or symptoms of abuse/neglect since last visito No Secondary Verification Process Completed: Yes Hospitalized since last visit: No Patient Requires Transmission-Based Precautions: No Implantable device outside of the clinic excluding No Patient Has Alerts: Yes cellular tissue based products placed in the center Patient Alerts: Patient on Blood Thinner since last visit: ABI's= R 1.05 L1.16 Has Compression in Place as Prescribed: Yes Pain Present Now: No Electronic Signature(s) Signed: 01/15/2022 4:39:11 PM By: Thayer Dallas Entered By: Thayer Dallas on 01/15/2022 13:48:46 -------------------------------------------------------------------------------- Clinic Level of Care Assessment Details Patient Name: Date of Service: JANSEN, GOODPASTURE 01/15/2022 1:45 PM Medical Record Number: [id number] Patient Account Number: [id number] Date of Birth/Sex: Treating RN: 06/15/1950 (71 y.o. Ardis Rowan, Lauren Primary Care Kartier Bennison: Harriet Masson Other Clinician: Referring Diem Dicocco: Treating Keanan Melander/Extender: Irineo Axon in Treatment: 9 Clinic Level of Care Assessment Items TOOL 4 Quantity Score X- 1 0 Use when only an EandM is performed on FOLLOW-UP visit ASSESSMENTS - Nursing Assessment / Reassessment X- 1 10 Reassessment of Co-morbidities (includes updates in patient status) X- 1 5 Reassessment of Adherence to Treatment Plan ASSESSMENTS - Wound and Skin A ssessment / Reassessment X - Simple Wound Assessment / Reassessment - one wound 1 5 X- 2 5 Complex Wound Assessment / Reassessment - multiple wounds  - 0 Dermatologic / Skin Assessment (not related to wound area) ASSESSMENTS - Focused Assessment X- 2 5 Circumferential Edema Measurements - multi extremities  - 0 Nutritional Assessment / Counseling / Intervention NAKIEA, METZNER ([id number]) [id number] Page 2 of 11  - 0 Lower Extremity Assessment (monofilament, tuning fork, pulses)  - 0 Peripheral Arterial Disease Assessment (using hand held doppler) ASSESSMENTS - Ostomy and/or Continence Assessment and Care  - 0 Incontinence Assessment and Management  - 0 Ostomy Care Assessment and Management (repouching, etc.) PROCESS - Coordination of Care  - 0 Simple Patient / Family Education for ongoing care X- 1 20 Complex (extensive) Patient / Family Education for ongoing care X- 1 10 Staff obtains Chiropractor, Records, T Results / Process Orders est  - 0 Staff telephones HHA, Nursing Homes / Clarify orders / etc  - 0 Routine Transfer to another Facility (non-emergent condition)  - 0 Routine Hospital Admission (non-emergent condition)  - 0 New Admissions / Manufacturing engineer / Ordering NPWT Apligraf, etc. ,  - 0 Emergency Hospital Admission (emergent condition) X- 1 10 Simple Discharge Coordination  - 0 Complex (extensive) Discharge Coordination PROCESS - Special Needs  - 0 Pediatric / Minor Patient Management  - 0 Isolation Patient Management  - 0 Hearing  / Language / Visual special needs  - 0 Assessment of Community assistance (transportation, D/C planning, etc.)  - 0 Additional assistance / Altered mentation  - 0 Support Surface(s) Assessment (bed, cushion, seat, etc.) INTERVENTIONS - Wound Cleansing /  Measurement []  - 0 Simple Wound Cleansing - one wound X- 2 5 Complex Wound Cleansing - multiple wounds X- 1 5 Wound Imaging (photographs - any number of wounds) []  - 0 Wound Tracing (instead of photographs) []  - 0 Simple Wound Measurement - one wound X- 2 5 Complex Wound Measurement - multiple wounds INTERVENTIONS - Wound Dressings []  - 0 Small Wound Dressing one or multiple wounds X- 2 15 Medium Wound Dressing one or multiple wounds []  - 0 Large Wound Dressing one or multiple wounds X- 1 5 Application of Medications - topical []  - 0 Application of Medications - injection INTERVENTIONS - Miscellaneous []  - 0 External ear exam []  - 0 Specimen Collection (cultures, biopsies, blood, body fluids, etc.) []  - 0 Specimen(s) / Culture(s) sent or taken to Lab for analysis []  - 0 Patient Transfer (multiple staff / / Similar devices) []  - 0 Simple Staple / Suture removal (25 or less) []  - 0 Complex Staple / Suture removal (26 or more) []  - 0 Hypo / Hyperglycemic Management (close monitor of Blood Glucose) EVERLENE, CUNNING D ( ) [id number] Page 3 of 11 []  - 0 Ankle / Brachial Index (ABI) - do not check if billed separately X- 1 5 Vital Signs Has the patient been seen at the hospital within the last three years: Yes Total Score: 145 Level Of Care: New/Established - Level 4 Electronic Signature(s) Signed: 01/15/2022 4:45:06 PM By: RN Entered By: on 01/15/2022 14:23:02 -------------------------------------------------------------------------------- Encounter Discharge Information Details Patient Name: Date of Service: D.  01/15/2022 1:45 PM Medical Record Number: Nurse, adult Patient Account Number: Date of Birth/Sex: Treating RN: January 15, 1951 (71 y.o. Elliot Gault, Lauren Primary Care Jathniel Smeltzer: [id number] Other Clinician: Referring Reilley Valentine: Treating Sylina Henion/Extender: 08-03-1971 in Treatment: 9 Encounter Discharge Information Items Discharge Condition: Stable Ambulatory Status: Wheelchair Discharge Destination: Home Transportation: Private Auto Accompanied By: daughter Schedule Follow-up Appointment: Yes Clinical Summary of Care: Patient Declined Electronic Signature(s) Signed: 01/15/2022 4:45:06 PM By: 01/17/2022 RN Entered By: Fonnie Mu on 01/15/2022 14:23:43 -------------------------------------------------------------------------------- Lower Extremity Assessment Details Patient Name: Date of Service: 01/17/2022 D. 01/15/2022 1:45 PM Medical Record Number: 01/17/2022 Patient Account Number: [id number] Date of Birth/Sex: Treating RN: June 16, 1950 (71 y.o. F) Primary Care Kolton Kienle: (63 Other Clinician: Referring Chanz Cahall: Treating Nella Botsford/Extender: Ardis Rowan in Treatment: 9 Edema Assessment Assessed: Harriet Masson: Yes] Irineo Axon: Yes] Edema: [Left: Yes] [Right: Yes] Calf Left: Right: Point of Measurement: 28 cm From Medial Instep 48.5 cm 47 cm Ankle Left: Right: Point of Measurement: 9 cm From Medial Instep 27.6 cm 23.4 cm Vascular Assessment DHRUTI, GHUMAN (Fonnie Mu) [Right:[id number] Page 4 of 11] Pulses: Dorsalis Pedis Palpable: [Left:Yes] [Right:Yes] Electronic Signature(s) Signed: 01/15/2022 4:39:11 PM By: 01/17/2022 Entered By: Sherri Rad on 01/15/2022 14:01:01 -------------------------------------------------------------------------------- Multi Wound Chart Details Patient Name: Date of Service: [id number] D. 01/15/2022 1:45 PM Medical  Record Number: 12/21/1950 Patient Account Number: [id number] Date of Birth/Sex: Treating RN: 12/31/1950 (71 y.o. F) Primary Care Martice Doty: Kyra Searles Other Clinician: Referring Shadoe Bethel: Treating Willam Munford/Extender: Franne Forts in Treatment: 9 Vital Signs Height(in): Pulse(bpm): 57 Weight(lbs): Blood Pressure(mmHg): 158/83 Body Mass Index(BMI): Temperature(F): 98.7 Respiratory Rate(breaths/min): 17 [6:Photos:] Right, Medial Lower Leg Left, Posterior Lower Leg Left, Anterior Lower Leg Wound Location: Blister Gradually Appeared Gradually Appeared Wounding Event: Lymphedema Lymphedema Lymphedema Primary Etiology: Anemia, Asthma, Sleep Apnea, Anemia, Asthma, Sleep Apnea, Anemia, Asthma, Sleep Apnea, Comorbid History: Congestive Heart  Failure, Coronary Congestive Heart Failure, Coronary Congestive Heart Failure, Coronary Artery Disease, Deep Vein Artery Disease, Deep Vein Artery Disease, Deep Vein Thrombosis, Hypertension, Myocardial Thrombosis, Hypertension, Myocardial Thrombosis, Hypertension, Myocardial Infarction, Gout, Osteoarthritis, SeizureInfarction, Gout, Osteoarthritis, SeizureInfarction, Gout, Osteoarthritis, Seizure Disorder Disorder Disorder 10/23/2021 12/05/2021 12/05/2021 Date Acquired: 9 5 5  Weeks of Treatment: Open Open Open Wound Status: No No No Wound Recurrence: 0x0x0 0x0x0 0x0x0 Measurements L x W x D (cm) 0 0 0 A (cm) : rea 0 0 0 Volume (cm) : 100.00% 100.00% 100.00% % Reduction in Area: 100.00% 100.00% 100.00% % Reduction in Volume: Full Thickness Without Exposed Full Thickness Without Exposed Full Thickness Without Exposed Classification: Support Structures Support Structures Support Structures None Present None Present None Present Exudate Amount: Distinct, outline attached Distinct, outline attached Distinct, outline attached Wound Margin: None Present (0%) None Present (0%) None Present (0%) Granulation  Amount: None Present (0%) None Present (0%) None Present (0%) Necrotic Amount: Fascia: No Fascia: No Fascia: No Exposed Structures: Fat Layer (Subcutaneous Tissue): No Fat Layer (Subcutaneous Tissue): No Fat Layer (Subcutaneous Tissue): No Tendon: No Tendon: No Tendon: No Muscle: No Muscle: No Muscle: No Joint: No Joint: No Joint: No Bone: No Bone: No Bone: No Large (67-100%) Large (67-100%) Large (67-100%) Epithelialization: Excoriation: No Excoriation: No Excoriation: No Periwound Skin Texture: Induration: No Induration: No Induration: No Callus: No Callus: No Callus: No Crepitus: No Crepitus: No Crepitus: No Rash: No Rash: No Rash: No LILLEIGH, HECHAVARRIA ([name]) [id number] Page 5 of 11 Scarring: No Scarring: No Scarring: No Maceration: No Maceration: No Maceration: No Periwound Skin Moisture: Dry/Scaly: No Dry/Scaly: No Dry/Scaly: No Atrophie Blanche: No Atrophie Blanche: No Atrophie Blanche: No Periwound Skin Color: Cyanosis: No Cyanosis: No Cyanosis: No Ecchymosis: No Ecchymosis: No Ecchymosis: No Erythema: No Erythema: No Erythema: No Hemosiderin Staining: No Hemosiderin Staining: No Hemosiderin Staining: No Mottled: No Mottled: No Mottled: No Pallor: No Pallor: No Pallor: No Rubor: No Rubor: No Rubor: No Treatment Notes Wound #6 (Lower Leg) Wound Laterality: Right, Medial Cleanser Peri-Wound Care Topical Primary Dressing Secondary Dressing Secured With Compression Wrap Compression Stockings Add-Ons Wound #8 (Lower Leg) Wound Laterality: Left, Posterior Cleanser Peri-Wound Care Topical Primary Dressing Secondary Dressing Secured With Compression Wrap Compression Stockings Add-Ons Wound #9 (Lower Leg) Wound Laterality: Left, Anterior Cleanser Peri-Wound Care Topical Primary Dressing Secondary Dressing Secured With Compression Wrap Compression Stockings Add-Ons Electronic  Signature(s) Signed: 01/15/2022 2:46:35 PM By: 01/17/2022 DO Entered By: Geralyn Corwin on 01/15/2022 14:29:31 Sawka, Dallys D (01/17/2022) [id number] Page 6 of 11 -------------------------------------------------------------------------------- Multi-Disciplinary Care Plan Details Patient Name: Date of Service: KERSTON, LANDECK 01/15/2022 1:45 PM Medical Record Number: 01/17/2022 Patient Account Number: [id number] Date of Birth/Sex: Treating RN: 1951/01/22 (71 y.o. 80, Lauren Primary Care Gerrard Crystal: Ardis Rowan Other Clinician: Referring Zeppelin Beckstrand: Treating Roderica Cathell/Extender: Harriet Masson in Treatment: 9 Active Inactive Electronic Signature(s) Signed: 01/30/2022 5:07:30 PM By: 14/06/2021 RN, BSN Signed: 02/28/2022 5:36:15 PM By: 04/29/2022 RN Previous Signature: 01/15/2022 4:45:06 PM Version By: 01/17/2022 RN Entered By: Fonnie Mu on 01/30/2022 17:07:30 -------------------------------------------------------------------------------- Pain Assessment Details Patient Name: Date of Service: 14/06/2021 D. 01/15/2022 1:45 PM Medical Record Number: 01/17/2022 Patient Account Number: [id number] Date of Birth/Sex: Treating RN: September 17, 1950 (71 y.o. F) Primary Care Jondavid Schreier: 07-12-1974 Other Clinician: Referring Annison Birchard: Treating Tage Feggins/Extender: Harriet Masson in Treatment: 9 Active Problems Location of Pain Severity and Description of Pain Patient Has Paino No Site Locations Rate the pain. Current  Pain Level: 0 Pain Management and Medication Current Pain Management: Medication: No Cold Application: No Rest: No Massage: No Activity: No T.E.N.S.: No Heat Application: No Leg drop or elevation: No Is the Current Pain Management Adequate: Adequate How does your wound impact your activities of daily livingo Sleep: No Bathing: No LIEL, RUDDEN  ([id number]) [id number] Page 7 of 11 Appetite: No Relationship With Others: No Bladder Continence: No Emotions: No Bowel Continence: No Work: No Toileting: No Drive: No Dressing: No Hobbies: No Electronic Signature(s) Signed: 01/15/2022 4:39:11 PM By: Thayer Dallas Entered By: Thayer Dallas on 01/15/2022 14:00:26 -------------------------------------------------------------------------------- Patient/Caregiver Education Details Patient Name: Date of Service: [name] 11/20/2023andnbsp1:45 PM Medical Record Number: [id number] Patient Account Number: [id number] Date of Birth/Gender: Treating RN: 03/19/1950 (71 y.o. Ardis Rowan, Lauren Primary Care Physician: Harriet Masson Other Clinician: Referring Physician: Treating Physician/Extender: Irineo Axon in Treatment: 9 Education Assessment Education Provided To: Patient Education Topics Provided Venous: Methods: Explain/Verbal Responses: Reinforcements needed, State content correctly Electronic Signature(s) Signed: 01/15/2022 4:45:06 PM By: Fonnie Mu RN Entered By: Fonnie Mu on 01/15/2022 14:16:51 -------------------------------------------------------------------------------- Wound Assessment Details Patient Name: Date of Service: [name] 01/15/2022 1:45 PM Medical Record Number: [id number] Patient Account Number: [id number] Date of Birth/Sex: Treating RN: Jul 21, 1950 (71 y.o. F) Primary Care Alfretta Pinch: Harriet Masson Other Clinician: Referring Orel Cooler: Treating Corryn Madewell/Extender: Irineo Axon in Treatment: 9 Wound Status Wound Number: 6 Primary Lymphedema Etiology: Wound Location: Right, Medial Lower Leg Wound Open Wounding Event: Blister Status: Date Acquired: 10/23/2021 Comorbid Anemia, Asthma, Sleep Apnea, Congestive Heart Failure, Coronary Weeks Of Treatment: 9 History: Artery Disease,  Deep Vein Thrombosis, Hypertension, Myocardial Clustered Wound: No Infarction, Gout, Osteoarthritis, Seizure Disorder Photos LUNNA, VOGELGESANG ([id number]) [id number] Page 8 of 11 Wound Measurements Length: (cm) Width: (cm) Depth: (cm) Area: (cm) Volume: (cm) 0 % Reduction in Area: 100% 0 % Reduction in Volume: 100% 0 Epithelialization: Large (67-100%) 0 Tunneling: No 0 Undermining: No Wound Description Classification: Full Thickness Without Exposed Support Wound Margin: Distinct, outline attached Exudate Amount: None Present Structures Foul Odor After Cleansing: No Slough/Fibrino No Wound Bed Granulation Amount: None Present (0%) Exposed Structure Necrotic Amount: None Present (0%) Fascia Exposed: No Fat Layer (Subcutaneous Tissue) Exposed: No Tendon Exposed: No Muscle Exposed: No Joint Exposed: No Bone Exposed: No Periwound Skin Texture Texture Color No Abnormalities Noted: No No Abnormalities Noted: No Callus: No Atrophie Blanche: No Crepitus: No Cyanosis: No Excoriation: No Ecchymosis: No Induration: No Erythema: No Rash: No Hemosiderin Staining: No Scarring: No Mottled: No Pallor: No Moisture Rubor: No No Abnormalities Noted: No Dry / Scaly: No Maceration: No Electronic Signature(s) Signed: 01/15/2022 4:27:51 PM By: Shawn Stall RN, BSN Entered By: Shawn Stall on 01/15/2022 14:02:05 -------------------------------------------------------------------------------- Wound Assessment Details Patient Name: Date of Service: [name] 01/15/2022 1:45 PM Medical Record Number: [id number] Patient Account Number: [id number] Date of Birth/Sex: Treating RN: May 25, 1950 (71 y.o. F) Primary Care Linus Weckerly: Harriet Masson Other Clinician: Referring Kensington Rios: Treating Ibtisam Benge/Extender: Irineo Axon in Treatment: 9 Wound Status Wound Number: 8 Primary Lymphedema Etiology: Wound Location: Left,  Posterior Lower Leg Wound Open Wounding Event: Gradually Appeared StatusCAELIE, REMSBURG ([id number]) [id number] Page 9 of 11 Status: Date Acquired: 12/05/2021 Comorbid Anemia, Asthma, Sleep Apnea, Congestive Heart Failure, Coronary Weeks Of Treatment: 5 History: Artery Disease, Deep Vein Thrombosis, Hypertension, Myocardial Clustered Wound: No Infarction, Gout, Osteoarthritis, Seizure Disorder Photos Wound Measurements Length: (cm) Width: (cm) Depth: (cm) Area: (cm) Volume: (cm)  0 % Reduction in Area: 100% 0 % Reduction in Volume: 100% 0 Epithelialization: Large (67-100%) 0 Tunneling: No 0 Undermining: No Wound Description Classification: Full Thickness Without Exposed Support Wound Margin: Distinct, outline attached Exudate Amount: None Present Structures Wound Bed Granulation Amount: None Present (0%) Exposed Structure Necrotic Amount: None Present (0%) Fascia Exposed: No Fat Layer (Subcutaneous Tissue) Exposed: No Tendon Exposed: No Muscle Exposed: No Joint Exposed: No Bone Exposed: No Periwound Skin Texture Texture Color No Abnormalities Noted: No No Abnormalities Noted: No Callus: No Atrophie Blanche: No Crepitus: No Cyanosis: No Excoriation: No Ecchymosis: No Induration: No Erythema: No Rash: No Hemosiderin Staining: No Scarring: No Mottled: No Pallor: No Moisture Rubor: No No Abnormalities Noted: No Dry / Scaly: No Maceration: No Electronic Signature(s) Signed: 01/15/2022 4:27:51 PM By: Shawn Stalleaton, Bobbi RN, BSN Entered By: Shawn Stalleaton, Bobbi on 01/15/2022 14:02:58 -------------------------------------------------------------------------------- Wound Assessment Details Patient Name: Date of Service: Sherri RadSIMMO NS, Carra D. 01/15/2022 1:45 PM Medical Record Number: [id number] Patient Account Number: [id number] Date of Birth/Sex: Treating RN: [birth year] [age] y.o. F) Primary Care Sudie Bandel: Harriet MassonMaldonado, Edgar Other  Clinician: Referring Jamey Harman: Treating Chantee Cerino/Extender: Irineo AxonHoffman, Jessica Maldonado, Edgar Weeks in Treatment: [address] Titzer, Siara D ([id number]) [id number] Page 10 of 11 Wound Status Wound Number: 9 Primary Lymphedema Etiology: Wound Location: Left, Anterior Lower Leg Wound Open Wounding Event: Gradually Appeared Status: Date Acquired: 12/05/2021 Comorbid Anemia, Asthma, Sleep Apnea, Congestive Heart Failure, Coronary Weeks Of Treatment: 5 History: Artery Disease, Deep Vein Thrombosis, Hypertension, Myocardial Clustered Wound: No Infarction, Gout, Osteoarthritis, Seizure Disorder Photos Wound Measurements Length: (cm) Width: (cm) Depth: (cm) Area: (cm) Volume: (cm) 0 % Reduction in Area: 100% 0 % Reduction in Volume: 100% 0 Epithelialization: Large (67-100%) 0 Tunneling: No 0 Undermining: No Wound Description Classification: Full Thickness Without Exposed Support Wound Margin: Distinct, outline attached Exudate Amount: None Present Structures Foul Odor After Cleansing: No Slough/Fibrino No Wound Bed Granulation Amount: None Present (0%) Exposed Structure Necrotic Amount: None Present (0%) Fascia Exposed: No Fat Layer (Subcutaneous Tissue) Exposed: No Tendon Exposed: No Muscle Exposed: No Joint Exposed: No Bone Exposed: No Periwound Skin Texture Texture Color No Abnormalities Noted: No No Abnormalities Noted: No Callus: No Atrophie Blanche: No Crepitus: No Cyanosis: No Excoriation: No Ecchymosis: No Induration: No Erythema: No Rash: No Hemosiderin Staining: No Scarring: No Mottled: No Pallor: No Moisture Rubor: No No Abnormalities Noted: No Dry / Scaly: No Maceration: No Electronic Signature(s) Signed: 01/15/2022 4:27:51 PM By: Shawn Stalleaton, Bobbi RN, BSN Entered By: Shawn Stalleaton, Bobbi on 01/15/2022 14:02:24 Vitals Details -------------------------------------------------------------------------------- Loni MuseSIMMONS, Estephania D  ([id number]) [id number] Page 11 of 11 Patient Name: Date of Service: Rosezena SensorSIMMO NS, Aundra D. 01/15/2022 1:45 PM Medical Record Number: [id number] Patient Account Number: [id number] Date of Birth/Sex: Treating RN: [birth year] (71 y.o. F) Primary Care Toma Arts: Harriet MassonMaldonado, Edgar Other Clinician: Referring Jerell Demery: Treating Gurkirat Basher/Extender: Irineo AxonHoffman, Jessica Maldonado, Edgar Weeks in Treatment: 9 Vital Signs Time Taken: 13:48 Temperature (F): 98.7 Pulse (bpm): 57 Respiratory Rate (breaths/min): 17 Blood Pressure (mmHg): 158/83 Reference Range: 80 - 120 mg / dl Electronic Signature(s) Signed: 01/15/2022 4:39:11 PM By: Thayer Dallasick, Kimberly Entered By: Thayer Dallasick, Kimberly on 01/15/2022 13:49:20

## 2022-01-15 NOTE — Progress Notes (Signed)
AMMARA, RAJ ([id number]) [id number] Page 1 of 8 Visit Report for 01/15/2022 Chief Complaint Document Details Patient Name: Date of Service: Vanessa Palmer, Vanessa Palmer 01/15/2022 1:45 PM Medical Record Number: [id number] Patient Account Number: [id number] Date of Birth/Sex: Treating RN: 03/05/1950 (71 y.o. F) Primary Care Provider: Harriet Masson Other Clinician: Referring Provider: Treating Provider/Extender: Irineo Axon in Treatment: 9 Information Obtained from: Patient Chief Complaint 09/07/2021; scattered open wounds to the lower extremities bilaterally 11/09/21; Patient returns to clinic with recurrent small weeping areas on her bilateral lower legs Electronic Signature(s) Signed: 01/15/2022 2:46:35 PM By: Geralyn Corwin DO Entered By: Geralyn Corwin on 01/15/2022 14:29:40 -------------------------------------------------------------------------------- HPI Details Patient Name: Date of Service: Vanessa Palmer, [name]. 01/15/2022 1:45 PM Medical Record Number: [id number] Patient Account Number: [id number] Date of Birth/Sex: Treating RN: 11/24/1950 (71 y.o. F) Primary Care Provider: Harriet Masson Other Clinician: Referring Provider: Treating Provider/Extender: Irineo Axon in Treatment: 9 History of Present Illness HPI Description: Admission 09/07/2021 Ms. [name] is a 71 year old female with a past medical history of essential hypertension, chronic kidney disease stage III, and lymphedema that presents to the clinic for a 2-week history of scattered open wounds to her lower extremities bilaterally. She states this has been an ongoing issue for the past year. She states that the wounds wax and wane in healing. She currently takes torsemide. She does not wear compression stockings. She currently denies signs of infection. 7/20; patient presents for follow-up. She received 1 juxta lite  compression wrap in the mail. She was supposed to receive 2. She states she talked to the company and they are sending her her second pair. We used antibiotic ointment and silver alginate under 3 layer compression at last clinic visit. The silver alginate stuck to the wound bed. The left lower extremity wounds have healed. 7/27; patient presents for follow-up. She still has not received her juxta lite compression for the right leg. We spoke with the wound care supply company today and they stated it will arrive today. We have been using Xeroform under 3 layer compression to the right lower extremity. She has been using the juxta lite compression to the left lower extremity. Her wounds remain healed to the left lower extremity. READMISSION 11/09/2021 This is a patient that we had in clinic in July. She has lymphedema secondary to chronic venous insufficiency and had scattered open areas on her bilateral lower extremities she received 3 layer compression bilaterally the area is closed over and she was prescribed juxta lite stockings. She tells me she was compliant with the juxta lites, apparently she has home health aides to help her put these on. About 3 weeks ago she noted increase in swelling and weeping areas in her bilateral lower legs and she is in for review of this. She does not have arterial issues her ABIs in the clinic last time were quite normal bilaterally. She does have chronic kidney disease stage III and lumbar radiculopathy. 9/21; patient presents for follow-up. She presented last week with open wounds to her legs bilaterally. Silver alginate under 3 layer compression was used. Her left lower extremity wounds have healed. She has a juxta light compression at home. She still has open wounds on the right lower extremity. She denies signs of infection. She tolerated the compression wraps well. 9/28; as I understand things we actually healed out her left leg last week and transitioned her  to juxta lites however almost immediately fluid ripped out of [name], Chaquana  Palmer ([id number]) [id number] Page 2 of 8 several open areas on the left leg. She comes in today with significant bilateral lower extremity edema. 10/5; patient presents for follow-up. Her left leg has healed again. She states she has her juxta lite compression wraps at home. We have been using silver alginate with 3 layer compression to the right lower extremity. She has no issues or complaints today. 10/12; patient presents for follow-up. We will wrapped her right leg with silver alginate under 3 layer compression at last clinic visit. Her left lower extremity wounds had healed and she was using her juxta lite compression wraps. Unfortunately she has reopened wounds to this leg. 10/26; patient presents for follow-up. She has been without compression wraps since Monday. She went for her venous reflux studies however could not tolerate the ultrasound probe pressing against her leg. They were able to state that there was no DVT We have been using silver alginate under 3 layer . compression. 11/15; patient presents for follow-up. At last nurse visit she did not have open wounds and was placed in her juxta lite compression. She did not come in wearing these. She has a few new scattered open areas limited to skin breakdown T the lower extremities bilaterally. o 11/20; patient presents for follow-up. We have been using silver alginate under 3 layer compression to the lower extremities bilaterally. Her wounds have healed. She has her juxta lite compressions with her today. Electronic Signature(s) Signed: 01/15/2022 2:46:35 PM By: Geralyn CorwinHoffman, Rita Prom DO Entered By: Geralyn CorwinHoffman, Dillinger Aston on 01/15/2022 14:30:03 -------------------------------------------------------------------------------- Physical Exam Details Patient Name: Date of Service: Vanessa Palmer. 01/15/2022 1:45 PM Medical Record Number:  [id number] Patient Account Number: [id number] Date of Birth/Sex: Treating RN: 12/17/1950 [age] y.o. F) Primary Care Provider: Harriet MassonMaldonado, Edgar Other Clinician: Referring Provider: Treating Provider/Extender: Irineo AxonHoffman, Tashaya Ancrum Maldonado, Edgar Weeks in Treatment: 9 Constitutional respirations regular, non-labored and within target range for patient.. Cardiovascular 2+ dorsalis pedis/posterior tibialis pulses. Psychiatric pleasant and cooperative. Notes Epithelization to the previous wound sites on the lower extremities bilaterally. Good edema control. Electronic Signature(s) Signed: 01/15/2022 2:46:35 PM By: Geralyn CorwinHoffman, Ahman Dugdale DO Entered By: Geralyn CorwinHoffman, Korene Dula on 01/15/2022 14:30:26 -------------------------------------------------------------------------------- Physician Orders Details Patient Name: Date of Service: Vanessa CroftsSIMMO NS, Keene BreathIDELL Palmer. 01/15/2022 1:45 PM Medical Record Number: [id number] Patient Account Number: [id number] Date of Birth/Sex: Treating RN: 12/17/1950 [age] y.o. Ardis RowanF) Breedlove, Lauren Primary Care Provider: Harriet MassonMaldonado, Edgar Other Clinician: Referring Provider: Treating Provider/Extender: Irineo AxonHoffman, Lawyer Washabaugh Maldonado, Edgar Weeks in Treatment: 9 Verbal / Phone Orders: No Diagnosis Coding [facility] Elliot GaultSIMMONS, Lovenia Palmer ([id number]) [id number] Page 3 of 8 Discharge from Wound Care Center Edema Control - Lymphedema / SCD / Other Elevate legs to the level of the heart or above for 30 minutes daily and/or when sitting, a frequency of: Avoid standing for long periods of time. Patient to wear own compression stockings every day. - apply juxtalites in the AM and remove at night Electronic Signature(s) Signed: 01/15/2022 2:46:35 PM By: Geralyn CorwinHoffman, Kalev Temme DO Entered By: Geralyn CorwinHoffman, Jabron Weese on 01/15/2022 14:30:34 -------------------------------------------------------------------------------- Problem List Details Patient Name: Date of Service: Vanessa RadSIMMO  NS, Maelynn Palmer. 01/15/2022 1:45 PM Medical Record Number: [id number] Patient Account Number: [id number] Date of Birth/Sex: Treating RN: 12/17/1950 [age] y.o. F) Primary Care Provider: Harriet MassonMaldonado, Edgar Other Clinician: Referring Provider: Treating Provider/Extender: Irineo AxonHoffman, Kerrigan Glendening Maldonado, Edgar Weeks in Treatment: 9 Active Problems ICD-10 Encounter Code Description Active Date MDM Diagnosis I89.0 Lymphedema, not elsewhere classified 11/09/2021 No Yes L97.818 Non-pressure chronic ulcer of other part of right lower leg  with other specified 11/09/2021 No Yes severity L97.828 Non-pressure chronic ulcer of other part of left lower leg with other specified 11/09/2021 No Yes severity I87.303 Chronic venous hypertension (idiopathic) without complications of bilateral 11/09/2021 No Yes lower extremity Inactive Problems Resolved Problems Electronic Signature(s) Signed: 01/15/2022 2:46:35 PM By: Geralyn Corwin DO Entered By: Geralyn Corwin on 01/15/2022 14:29:23 -------------------------------------------------------------------------------- Progress Note Details Patient Name: Date of Service: Vanessa Rad Palmer. 01/15/2022 1:45 PM Medical Record Number: [id number] Patient Account Number: [id number] Date of Birth/Sex: Treating RN: February 01, 1951 (71 y.o. Kamalei Roeder, [name] ([id number]) [id number] Page 4 of 8 Primary Care Provider: Harriet Masson Other Clinician: Referring Provider: Treating Provider/Extender: Irineo Axon in Treatment: 9 Subjective Chief Complaint Information obtained from Patient 09/07/2021; scattered open wounds to the lower extremities bilaterally 11/09/21; Patient returns to clinic with recurrent small weeping areas on her bilateral lower legs History of Present Illness (HPI) Admission 09/07/2021 Ms. Sherlin Petsch is a 71 year old female with a past medical history of essential hypertension, chronic kidney disease  stage III, and lymphedema that presents to the clinic for a 2-week history of scattered open wounds to her lower extremities bilaterally. She states this has been an ongoing issue for the past year. She states that the wounds wax and wane in healing. She currently takes torsemide. She does not wear compression stockings. She currently denies signs of infection. 7/20; patient presents for follow-up. She received 1 juxta lite compression wrap in the mail. She was supposed to receive 2. She states she talked to the company and they are sending her her second pair. We used antibiotic ointment and silver alginate under 3 layer compression at last clinic visit. The silver alginate stuck to the wound bed. The left lower extremity wounds have healed. 7/27; patient presents for follow-up. She still has not received her juxta lite compression for the right leg. We spoke with the wound care supply company today and they stated it will arrive today. We have been using Xeroform under 3 layer compression to the right lower extremity. She has been using the juxta lite compression to the left lower extremity. Her wounds remain healed to the left lower extremity. READMISSION 11/09/2021 This is a patient that we had in clinic in July. She has lymphedema secondary to chronic venous insufficiency and had scattered open areas on her bilateral lower extremities she received 3 layer compression bilaterally the area is closed over and she was prescribed juxta lite stockings. She tells me she was compliant with the juxta lites, apparently she has home health aides to help her put these on. About 3 weeks ago she noted increase in swelling and weeping areas in her bilateral lower legs and she is in for review of this. She does not have arterial issues her ABIs in the clinic last time were quite normal bilaterally. She does have chronic kidney disease stage III and lumbar radiculopathy. 9/21; patient presents for follow-up.  She presented last week with open wounds to her legs bilaterally. Silver alginate under 3 layer compression was used. Her left lower extremity wounds have healed. She has a juxta light compression at home. She still has open wounds on the right lower extremity. She denies signs of infection. She tolerated the compression wraps well. 9/28; as I understand things we actually healed out her left leg last week and transitioned her to juxta lites however almost immediately fluid ripped out of several open areas on the left leg. She comes in today with significant bilateral  lower extremity edema. 10/5; patient presents for follow-up. Her left leg has healed again. She states she has her juxta lite compression wraps at home. We have been using silver alginate with 3 layer compression to the right lower extremity. She has no issues or complaints today. 10/12; patient presents for follow-up. We will wrapped her right leg with silver alginate under 3 layer compression at last clinic visit. Her left lower extremity wounds had healed and she was using her juxta lite compression wraps. Unfortunately she has reopened wounds to this leg. 10/26; patient presents for follow-up. She has been without compression wraps since Monday. She went for her venous reflux studies however could not tolerate the ultrasound probe pressing against her leg. They were able to state that there was no DVT We have been using silver alginate under 3 layer . compression. 11/15; patient presents for follow-up. At last nurse visit she did not have open wounds and was placed in her juxta lite compression. She did not come in wearing these. She has a few new scattered open areas limited to skin breakdown T the lower extremities bilaterally. o 11/20; patient presents for follow-up. We have been using silver alginate under 3 layer compression to the lower extremities bilaterally. Her wounds have healed. She has her juxta lite compressions with  her today. Patient History Information obtained from Chart. Family History Diabetes - Siblings, Heart Disease - Father,Siblings. Social History Never smoker, Alcohol Use - Rarely - sober since 2006. Medical History Hematologic/Lymphatic Patient has history of Anemia Respiratory Patient has history of Asthma, Sleep Apnea - 2018- oCPAP Cardiovascular Patient has history of Congestive Heart Failure, Coronary Artery Disease, Deep Vein Thrombosis - 2014 both legs, Hypertension, Myocardial Infarction - 2005 Musculoskeletal Patient has history of Gout, Osteoarthritis Neurologic Patient has history of Seizure Disorder - as a teenager Hospitalization/Surgery History - heart cath 2018, 2003, 2004, 2006. - right total hip replacement 2016. - hysterectomy. Medical A Surgical History Notes nd Constitutional Symptoms (General Health) chronic back pain Migraine last 2018 Cardiovascular hyperlipidemia NIESHA, BAME ([id number]) [id number] Page 5 of 8 Gastrointestinal GERD Genitourinary stage III CKD Musculoskeletal DJD Psychiatric depression Objective Constitutional respirations regular, non-labored and within target range for patient.. Vitals Time Taken: 1:48 PM, Temperature: 98.7 F, Pulse: 57 bpm, Respiratory Rate: 17 breaths/min, Blood Pressure: 158/83 mmHg. Cardiovascular 2+ dorsalis pedis/posterior tibialis pulses. Psychiatric pleasant and cooperative. General Notes: Epithelization to the previous wound sites on the lower extremities bilaterally. Good edema control. Integumentary (Hair, Skin) Wound #6 status is Open. Original cause of wound was Blister. The date acquired was: 10/23/2021. The wound has been in treatment 9 weeks. The wound is located on the Right,Medial Lower Leg. The wound measures 0cm length x 0cm width x 0cm depth; 0cm^2 area and 0cm^3 volume. There is no tunneling or undermining noted. There is a none present amount of drainage  noted. The wound margin is distinct with the outline attached to the wound base. There is no granulation within the wound bed. There is no necrotic tissue within the wound bed. The periwound skin appearance did not exhibit: Callus, Crepitus, Excoriation, Induration, Rash, Scarring, Dry/Scaly, Maceration, Atrophie Blanche, Cyanosis, Ecchymosis, Hemosiderin Staining, Mottled, Pallor, Rubor, Erythema. Wound #8 status is Open. Original cause of wound was Gradually Appeared. The date acquired was: 12/05/2021. The wound has been in treatment 5 weeks. The wound is located on the Left,Posterior Lower Leg. The wound measures 0cm length x 0cm width x 0cm depth; 0cm^2 area and 0cm^3 volume. There is  no tunneling or undermining noted. There is a none present amount of drainage noted. The wound margin is distinct with the outline attached to the wound base. There is no granulation within the wound bed. There is no necrotic tissue within the wound bed. The periwound skin appearance did not exhibit: Callus, Crepitus, Excoriation, Induration, Rash, Scarring, Dry/Scaly, Maceration, Atrophie Blanche, Cyanosis, Ecchymosis, Hemosiderin Staining, Mottled, Pallor, Rubor, Erythema. Wound #9 status is Open. Original cause of wound was Gradually Appeared. The date acquired was: 12/05/2021. The wound has been in treatment 5 weeks. The wound is located on the Left,Anterior Lower Leg. The wound measures 0cm length x 0cm width x 0cm depth; 0cm^2 area and 0cm^3 volume. There is no tunneling or undermining noted. There is a none present amount of drainage noted. The wound margin is distinct with the outline attached to the wound base. There is no granulation within the wound bed. There is no necrotic tissue within the wound bed. The periwound skin appearance did not exhibit: Callus, Crepitus, Excoriation, Induration, Rash, Scarring, Dry/Scaly, Maceration, Atrophie Blanche, Cyanosis, Ecchymosis, Hemosiderin Staining, Mottled, Pallor,  Rubor, Erythema. Assessment Active Problems ICD-10 Lymphedema, not elsewhere classified Non-pressure chronic ulcer of other part of right lower leg with other specified severity Non-pressure chronic ulcer of other part of left lower leg with other specified severity Chronic venous hypertension (idiopathic) without complications of bilateral lower extremity Patient has done well with silver alginate under compression therapy. Her wounds have healed. I recommended she wear her juxta lite compression garments daily. Follow-up as needed. Plan Discharge From [facility] Services: Discharge from Wound Care Center Edema Control - Lymphedema / SCD / Other: Elevate legs to the level of the heart or above for 30 minutes daily and/or when sitting, a frequency of: Avoid standing for long periods of time. Patient to wear own compression stockings every day. - apply juxtalites in the AM and remove at night JUNIA, NYGREN Palmer ([id number]) [id number] Page 6 of 8 1. Compression garments daily 2. Follow-up as needed 3. Discharge from clinic due to closed wound Electronic Signature(s) Signed: 01/15/2022 2:46:35 PM By: Geralyn Corwin DO Entered By: Geralyn Corwin on 01/15/2022 14:31:20 -------------------------------------------------------------------------------- HxROS Details Patient Name: Date of Service: Vanessa Palmer, Oralee Palmer. 01/15/2022 1:45 PM Medical Record Number: [id number] Patient Account Number: [id number] Date of Birth/Sex: Treating RN: 02/28/1950 (71 y.o. F) Primary Care Provider: Harriet Masson Other Clinician: Referring Provider: Treating Provider/Extender: Irineo Axon in Treatment: 9 Information Obtained From Chart Constitutional Symptoms (General Health) Medical History: Past Medical History Notes: chronic back pain Migraine last 2018 Hematologic/Lymphatic Medical History: Positive for: Anemia Respiratory Medical  History: Positive for: Asthma; Sleep Apnea - 2018- oCPAP Cardiovascular Medical History: Positive for: Congestive Heart Failure; Coronary Artery Disease; Deep Vein Thrombosis - 2014 both legs; Hypertension; Myocardial Infarction - 2005 Past Medical History Notes: hyperlipidemia Gastrointestinal Medical History: Past Medical History Notes: GERD Genitourinary Medical History: Past Medical History Notes: stage III CKD Musculoskeletal Medical History: Positive for: Gout; Osteoarthritis Past Medical History Notes: DJD Neurologic Medical History: Positive for: Seizure Disorder - as a teenager HAIZLEY, CANNELLA Palmer ([id number]) [id number] Page 7 of 8 Psychiatric Medical History: Past Medical History Notes: depression Immunizations Pneumococcal Vaccine: Received Pneumococcal Vaccination: No Implantable Devices No devices added Hospitalization / Surgery History Type of Hospitalization/Surgery heart cath 2018, 2003, 2004, 2006 right total hip replacement 2016 hysterectomy Family and Social History Diabetes: Yes - Siblings; Heart Disease: Yes - Father,Siblings; Never smoker; Alcohol Use: Rarely - sober since 2006 Electronic Signature(s) Signed:  01/15/2022 2:46:35 PM By: Geralyn Corwin DO Entered By: Geralyn Corwin on 01/15/2022 14:30:09 -------------------------------------------------------------------------------- SuperBill Details Patient Name: Date of Service: Vanessa Palmer, Sheridyn Palmer. 01/15/2022 Medical Record Number: [id number] Patient Account Number: [id number] Date of Birth/Sex: Treating RN: [birth year] (71 y.o. Ardis Rowan, Lauren Primary Care Provider: Harriet Masson Other Clinician: Referring Provider: Treating Provider/Extender: Irineo Axon in Treatment: 9 Diagnosis Coding ICD-10 Codes Code Description I89.0 Lymphedema, not elsewhere classified L97.818 Non-pressure chronic ulcer of other part of right lower  leg with other specified severity L97.828 Non-pressure chronic ulcer of other part of left lower leg with other specified severity I87.303 Chronic venous hypertension (idiopathic) without complications of bilateral lower extremity Facility Procedures : CPT4 Code: [id number] Description: 99214 - WOUND CARE VISIT-LEV 4 EST PT Modifier: Quantity: 1 Physician Procedures : CPT4 Code Description Modifier [id number] - WC PHYS LEVEL 3 - EST PT ICD-10 Diagnosis Description L97.818 Non-pressure chronic ulcer of other part of right lower leg with other specified severity L97.828 Non-pressure chronic ulcer of other part of  left lower leg with other specified severity I87.303 Chronic venous hypertension (idiopathic) without complications of bilateral lower extremity I89.0 Lymphedema, not elsewhere classified Quantity: 1 Electronic Signature(s) Signed: 01/15/2022 2:46:35 PM By: Jerelyn Charles Palmer ([id number]) [id number] Page 8 of 8 Entered By: Geralyn Corwin on 01/15/2022 14:31:35

## 2022-01-18 NOTE — Progress Notes (Signed)
AUDRIELLE, VANKUREN ([id number]) [id number] Page 1 of 6 Visit Report for 01/01/2022 Arrival Information Details Patient Name: Date of Service: Vanessa Palmer, Vanessa Palmer. 01/01/2022 10:30 A M Medical Record Number: [id number] Patient Account Number: [id number] Date of Birth/Sex: Treating RN: [birth year] (71 y.o. F) Primary Care Vanessa Palmer: Vanessa Palmer Other Clinician: Thayer Palmer Referring Vanessa Palmer: Treating Vanessa Palmer/Extender: Vanessa Palmer: 7 Visit Information History Since Last Visit All ordered tests and consults were completed: Yes Patient Arrived: Wheel Chair Added or deleted any medications: No Arrival Time: 10:40 Any new allergies or adverse reactions: No Accompanied By: daughter Had a fall or experienced change in No Transfer Assistance: None activities of daily living that may affect Patient Identification Verified: Yes risk of falls: Secondary Verification Process Completed: Yes Signs or symptoms of abuse/neglect since last visito No Patient Requires Transmission-Based Precautions: No Hospitalized since last visit: No Patient Has Alerts: Yes Implantable device outside of the clinic excluding No Patient Alerts: Patient on Blood Thinner cellular tissue based products placed in the center ABI's= R 1.05 L1.16 since last visit: Pain Present Now: No Electronic Signature(s) Signed: 01/18/2022 12:24:16 AM By: Vanessa Palmer CHT EMT BS , , Entered By: Vanessa Palmer on 01/01/2022 10:41:16 -------------------------------------------------------------------------------- Clinic Level of Care Assessment Details Patient Name: Date of Service: Vanessa Palmer, Vanessa Palmer 01/01/2022 10:30 A M Medical Record Number: [id number] Patient Account Number: [id number] Date of Birth/Sex: Treating RN: September 02, 1950 (71 y.o. F) Primary Care Vanessa Palmer: Vanessa Palmer Other Clinician: Referring Vanessa Palmer: Treating Vanessa Palmer:  Vanessa Palmer: 7 Clinic Level of Care Assessment Items TOOL 4 Quantity Score X- 1 0 Use when only an EandM is performed on FOLLOW-UP visit ASSESSMENTS - Nursing Assessment / Reassessment X- 1 10 Reassessment of Co-morbidities (includes updates in patient status) X- 1 5 Reassessment of Adherence to Palmer Plan ASSESSMENTS - Wound and Skin A ssessment / Reassessment X - Simple Wound Assessment / Reassessment - one wound 1 5 X- 5 Complex Wound Assessment / Reassessment - multiple wounds []  - 0 Dermatologic / Skin Assessment (not related to wound area) ASSESSMENTS - Focused Assessment []  - 0 Circumferential Edema Measurements - multi extremities []  - 0 Nutritional Assessment / Counseling / Intervention Vanessa Palmer ( ) [id number] Page 2 of 6 []  - 0 Lower Extremity Assessment (monofilament, tuning fork, pulses) []  - 0 Peripheral Arterial Disease Assessment (using hand held doppler) ASSESSMENTS - Ostomy and/or Continence Assessment and Care []  - 0 Incontinence Assessment and Management []  - 0 Ostomy Care Assessment and Management (repouching, etc.) PROCESS - Coordination of Care X - Simple Patient / Family Education for ongoing care 1 15 []  - 0 Complex (extensive) Patient / Family Education for ongoing care []  - 0 Staff obtains Vanessa Palmer, Records, T Results / Process Orders est []  - 0 Staff telephones HHA, Nursing Homes / Clarify orders / etc []  - 0 Routine Transfer to another Facility (non-emergent condition) []  - 0 Routine Hospital Admission (non-emergent condition) []  - 0 New Admissions / [id number] / Ordering NPWT Apligraf, etc. , []  - 0 Emergency Hospital Admission (emergent condition) X- 1 10 Simple Discharge Coordination []  - 0 Complex (extensive) Discharge Coordination PROCESS - Special Needs []  - 0 Pediatric / Minor Patient Management []  - 0 Isolation Patient  Management []  - 0 Hearing / Language / Visual special needs []  - 0 Assessment of Community assistance (transportation, Palmer/C planning, etc.) []  - 0 Additional assistance / Altered mentation []  - 0 Support Surface(s) Assessment (bed,  cushion, seat, etc.) INTERVENTIONS - Wound Cleansing / Measurement []  - 0 Simple Wound Cleansing - one wound []  - 0 Complex Wound Cleansing - multiple wounds []  - 0 Wound Imaging (photographs - any number of wounds) []  - 0 Wound Tracing (instead of photographs) []  - 0 Simple Wound Measurement - one wound []  - 0 Complex Wound Measurement - multiple wounds INTERVENTIONS - Wound Dressings []  - 0 Small Wound Dressing one or multiple wounds []  - 0 Medium Wound Dressing one or multiple wounds []  - 0 Large Wound Dressing one or multiple wounds []  - 0 Application of Medications - topical []  - 0 Application of Medications - injection INTERVENTIONS - Miscellaneous []  - 0 External ear exam []  - 0 Specimen Collection (cultures, biopsies, blood, body fluids, etc.) []  - 0 Specimen(s) / Culture(s) sent or taken to Lab for analysis []  - 0 Patient Transfer (multiple staff / / Similar devices) []  - 0 Simple Staple / Suture removal (25 or less) []  - 0 Complex Staple / Suture removal (26 or more) []  - 0 Hypo / Hyperglycemic Management (close monitor of Blood Glucose) Vanessa Palmer, Vanessa Palmer (  .pdf Page 3 of 6 []  - 0 Ankle / Brachial Index (ABI) - do not check if billed separately X- 1 5 Vital Signs Has the patient been seen at the hospital within the last three years: Yes Total Score: 55 Level Of Care: New/Established - Level 2 Electronic Signature(s) Signed: 01/01/2022 2:00:07 PM By: Entered By: on 01/01/2022 13:47:16 -------------------------------------------------------------------------------- Encounter Discharge Information Details Patient Name: Date of Service: Palmer. 01/01/2022 10:30 A M Medical Record Number: Patient Account Number: Date of Birth/Sex: Treating RN: 04-12-1950 (71 y.o. F) Primary Care Sabrinia Prien: Other Clinician: Nurse, adult Referring Katharine Rochefort: Treating Spence Soberano/Extender: in Palmer: 7 Encounter Discharge Information Items Discharge Condition: Stable Ambulatory Status: Wheelchair Discharge Destination: Home Transportation: Private Auto Accompanied By: daughter Schedule Follow-up Appointment: Yes Clinical Summary of Care: Electronic Signature(s) Signed: 01/01/2022 2:00:07 PM By: Entered By: Elliot Gault on 01/01/2022 13:48:14 -------------------------------------------------------------------------------- General Visit Notes Details Patient Name: Date of Service: ) [id number] Palmer. 01/01/2022 10:30 A M Medical Record Number: 13/07/2021 Patient Account Number: Vanessa Palmer Date of Birth/Sex: Treating RN: [birth year] (71 y.o. F) Primary Care Kaikoa Magro: Vanessa Palmer Other Clinician: 13/07/2021 Referring Machaela Caterino: Treating Quenten Nawaz/Extender: [id number] in Palmer: 7 Notes Patient was seen by Dr. [id number]. No open wounds per Dr. 12/21/1950. Ok to apply Bilateral Juxtalite. Electronic Signature(s) Signed: 01/01/2022 2:00:07 PM By: Vanessa Palmer Entered By: Vanessa Palmer on 01/01/2022 13:43:03 Valade, 13/07/2021 Palmer (Vanessa DallasThayer Palmer.pdf Page 4 of 6 -------------------------------------------------------------------------------- Patient/Caregiver Education Details Patient Name: Date of Service: Vanessa Palmer, Vanessa Palmer 11/6/2023andnbsp10:30 A M Medical Record Number: 13/07/2021 Patient Account Number: [id number] Date of Birth/Gender: Treating RN: [birth year] (71 y.o. F) Primary Care Physician: 07-12-1976 Other Clinician: Referring Physician: Treating  Physician/Extender: Vanessa Palmer in Palmer: 7 Education Assessment Education Provided To: Patient Education Topics Provided Electronic Signature(s) Signed: 01/01/2022 2:00:07 PM By: Vanessa Palmer Entered By: Lady Gary on 01/01/2022 13:47:54 -------------------------------------------------------------------------------- Wound Assessment Details Patient Name: Date of Service: 13/07/2021 Palmer. 01/01/2022 10:30 A M Medical Record Number: Vanessa Palmer Patient Account Number: 13/07/2021 Date of Birth/Sex: Treating RN: 09/11/1950 (71 y.o. F) Primary Care Najeeb Uptain: ) [id number] Other Clinician: Referring Cayne Yom: Treating Jaime Grizzell/Extender: Rosezena Sensor in Palmer: 7 Wound Status Wound Number: [id number] Primary Etiology:  Lymphedema Wound Location: Right, Posterior Lower Leg Wound Status: Open Wounding Event: Blister Date Acquired: 10/23/2021 Weeks Of Palmer: 7 Clustered Wound: No Wound Measurements Length: (cm) Width: (cm) Depth: (cm) Area: (cm) Volume: (cm) 0 % Reduction in Area: 100% 0 % Reduction in Volume: 100% 0 0 0 Wound Description Classification: Full Thickness Without Exposed Support Exudate Amount: Medium Exudate Type: Serous Exudate Color: amber Structures Periwound Skin Texture Texture Color No Abnormalities Noted: No No Abnormalities Noted: No Moisture No Abnormalities Noted: No Electronic Signature(s) Signed: 01/01/2022 2:00:07 PM By: Vanessa Palmer Entered By: Vanessa Palmer on 01/01/2022 13:44:14 Smolen, Fara Palmer ([id number]) [id number] Page 5 of 6 -------------------------------------------------------------------------------- Wound Assessment Details Patient Name: Date of Service: Vanessa Palmer, Vanessa Palmer 01/01/2022 10:30 A M Medical Record Number: [id number] Patient Account Number: [id number] Date of Birth/Sex: Treating RN: 07/15/1950 (71 y.o. F) Primary Care  Brantley Naser: Vanessa Palmer Other Clinician: Referring Maisee Vollman: Treating Bonnell Placzek/Extender: Vanessa Palmer: 7 Wound Status Wound Number: [id number] Primary Etiology: Lymphedema Wound Location: Left, Posterior Lower Leg Wound Status: Open Wounding Event: Gradually Appeared Date Acquired: 12/05/2021 Weeks Of Palmer: 3 Clustered Wound: No Wound Measurements Length: (cm) Width: (cm) Depth: (cm) Area: (cm) Volume: (cm) 0 % Reduction in Area: 100% 0 % Reduction in Volume: 100% 0 0 0 Wound Description Classification: Full Thickness Without Exposed Suppor Exudate Amount: Medium Exudate Type: Serosanguineous Exudate Color: red, brown t Structures Periwound Skin Texture Texture Color No Abnormalities Noted: No No Abnormalities Noted: No Moisture No Abnormalities Noted: No Electronic Signature(s) Signed: 01/01/2022 2:00:07 PM By: Vanessa Palmer Entered By: Vanessa Palmer on 01/01/2022 13:44:14 -------------------------------------------------------------------------------- Wound Assessment Details Patient Name: Date of Service: Vanessa Palmer Palmer. 01/01/2022 10:30 A M Medical Record Number: [id number] Patient Account Number: [id number] Date of Birth/Sex: Treating RN: 02-03-1951 (71 y.o. F) Primary Care Floyd Wade: Vanessa Palmer Other Clinician: Referring Darleny Sem: Treating Carlina Derks/Extender: Vanessa Palmer: 7 Wound Status Wound Number: 9 Primary Etiology: Lymphedema Wound Location: Left, Anterior Lower Leg Wound Status: Open Wounding Event: Gradually Appeared Date Acquired: 12/05/2021 Weeks Of Palmer: 3 Clustered Wound: No Wound Measurements Length: (cm) Vanessa Palmer, Vanessa Palmer ([id number]) Width: (cm) Depth: (cm) Area: (cm) Volume: (cm) 0 % Reduction in Area: 100% [id number] Page 6 of 6 0 % Reduction in Volume: 100% 0 0 0 Wound Description Classification: Full  Thickness Without Exposed Suppor Exudate Amount: Medium Exudate Type: Serosanguineous Exudate Color: red, brown t Structures Periwound Skin Texture Texture Color No Abnormalities Noted: No No Abnormalities Noted: No Moisture No Abnormalities Noted: No Electronic Signature(s) Signed: 01/01/2022 2:00:07 PM By: Vanessa Palmer Entered By: Vanessa Palmer on 01/01/2022 13:44:14 -------------------------------------------------------------------------------- Vitals Details Patient Name: Date of Service: Vanessa Palmer, Vanessa Breath Palmer. 01/01/2022 10:30 A M Medical Record Number: [id number] Patient Account Number: [id number] Date of Birth/Sex: Treating RN: Oct 12, 1950 (71 y.o. F) Primary Care Sebasthian Stailey: Vanessa Palmer Other Clinician: Referring Bassam Dresch: Treating Enedelia Martorelli/Extender: Vanessa Palmer: 7 Vital Signs Time Taken: 10:42 Temperature (F): 98.7 Pulse (bpm): 75 Respiratory Rate (breaths/min): 18 Blood Pressure (mmHg): 113/73 Reference Range: 80 - 120 mg / dl Electronic Signature(s) Signed: 01/18/2022 12:24:16 AM By: Vanessa Palmer CHT EMT BS , , Entered By: Vanessa Palmer on 01/01/2022 10:42:52

## 2022-01-24 ENCOUNTER — Telehealth: Payer: Self-pay | Admitting: Orthopaedic Surgery

## 2022-01-24 ENCOUNTER — Other Ambulatory Visit: Payer: Self-pay | Admitting: Orthopaedic Surgery

## 2022-01-24 MED ORDER — TIZANIDINE HCL 4 MG PO TABS: ORAL_TABLET | ORAL | 0 refills | Status: DC

## 2022-01-24 NOTE — Telephone Encounter (Signed)
Rx refill Tizanidine  Please use [facility] on [address]

## 2022-01-25 ENCOUNTER — Other Ambulatory Visit: Payer: Self-pay | Admitting: Orthopaedic Surgery

## 2022-01-25 ENCOUNTER — Telehealth: Payer: Self-pay | Admitting: Orthopaedic Surgery

## 2022-01-25 MED ORDER — TIZANIDINE HCL 4 MG PO TABS: ORAL_TABLET | ORAL | 0 refills | Status: DC

## 2022-01-25 NOTE — Telephone Encounter (Signed)
Patient called in because her Rx was called into the wrong pharmacy it was supposed to go to the [facility] on [address] not the one on [address] being she will not be able to pick up from there, please advise.

## 2022-02-05 ENCOUNTER — Emergency Department (HOSPITAL_COMMUNITY)
Admission: EM | Admit: 2022-02-05 | Discharge: 2022-02-05 | Disposition: A | Discharge status: Home / Self Care | Payer: Medicare Other | Attending: Student | Admitting: Student

## 2022-02-05 ENCOUNTER — Encounter (HOSPITAL_COMMUNITY): Payer: Self-pay | Admitting: Emergency Medicine

## 2022-02-05 ENCOUNTER — Other Ambulatory Visit: Payer: Self-pay

## 2022-02-05 DIAGNOSIS — T7840XA Allergy, unspecified, initial encounter: Secondary | ICD-10-CM | POA: Diagnosis not present

## 2022-02-05 DIAGNOSIS — R112 Nausea with vomiting, unspecified: Secondary | ICD-10-CM | POA: Diagnosis not present

## 2022-02-05 DIAGNOSIS — I2510 Atherosclerotic heart disease of native coronary artery without angina pectoris: Secondary | ICD-10-CM | POA: Insufficient documentation

## 2022-02-05 DIAGNOSIS — I509 Heart failure, unspecified: Secondary | ICD-10-CM | POA: Insufficient documentation

## 2022-02-05 DIAGNOSIS — Z7951 Long term (current) use of inhaled steroids: Secondary | ICD-10-CM | POA: Diagnosis not present

## 2022-02-05 DIAGNOSIS — L299 Pruritus, unspecified: Secondary | ICD-10-CM | POA: Diagnosis not present

## 2022-02-05 DIAGNOSIS — Z79899 Other long term (current) drug therapy: Secondary | ICD-10-CM | POA: Insufficient documentation

## 2022-02-05 DIAGNOSIS — N189 Chronic kidney disease, unspecified: Secondary | ICD-10-CM | POA: Diagnosis not present

## 2022-02-05 DIAGNOSIS — I129 Hypertensive chronic kidney disease with stage 1 through stage 4 chronic kidney disease, or unspecified chronic kidney disease: Secondary | ICD-10-CM | POA: Diagnosis not present

## 2022-02-05 DIAGNOSIS — R220 Localized swelling, mass and lump, head: Secondary | ICD-10-CM | POA: Insufficient documentation

## 2022-02-05 DIAGNOSIS — Z7901 Long term (current) use of anticoagulants: Secondary | ICD-10-CM | POA: Insufficient documentation

## 2022-02-05 DIAGNOSIS — R609 Edema, unspecified: Secondary | ICD-10-CM | POA: Diagnosis not present

## 2022-02-05 DIAGNOSIS — L509 Urticaria, unspecified: Secondary | ICD-10-CM | POA: Diagnosis not present

## 2022-02-05 DIAGNOSIS — Z743 Need for continuous supervision: Secondary | ICD-10-CM | POA: Diagnosis not present

## 2022-02-05 MED ORDER — METHYLPREDNISOLONE SODIUM SUCC 125 MG IJ SOLR
125.0000 mg | Freq: Once | INTRAMUSCULAR | Status: AC
  Administered 2022-02-05: 125 mg via INTRAVENOUS
  Filled 2022-02-05: qty 2

## 2022-02-05 MED ORDER — METOPROLOL SUCCINATE ER 25 MG PO TB24
50.0000 mg | ORAL_TABLET | Freq: Every day | ORAL | Status: DC
  Administered 2022-02-05: 50 mg via ORAL
  Filled 2022-02-05: qty 2

## 2022-02-05 MED ORDER — ACETAMINOPHEN 325 MG PO TABS: 325.0000 mg | ORAL_TABLET | Freq: Four times a day (QID) | ORAL | Status: DC | PRN

## 2022-02-05 MED ORDER — PREDNISONE 20 MG PO TABS: 40.0000 mg | ORAL_TABLET | Freq: Every day | ORAL | 0 refills | Status: DC

## 2022-02-05 MED ORDER — TIZANIDINE HCL 4 MG PO TABS
4.0000 mg | ORAL_TABLET | Freq: Once | ORAL | Status: AC
  Administered 2022-02-05: 4 mg via ORAL
  Filled 2022-02-05: qty 1

## 2022-02-05 MED ORDER — OXYCODONE HCL 5 MG PO TABS
10.0000 mg | ORAL_TABLET | Freq: Four times a day (QID) | ORAL | Status: DC | PRN
  Administered 2022-02-05: 10 mg via ORAL
  Filled 2022-02-05: qty 2

## 2022-02-05 MED ORDER — DIPHENHYDRAMINE HCL 12.5 MG/5ML PO ELIX: 12.5000 mg | ORAL_SOLUTION | Freq: Four times a day (QID) | ORAL | Status: DC | PRN

## 2022-02-05 MED ORDER — OXYCODONE-ACETAMINOPHEN 10-325 MG PO TABS: 1.0000 | ORAL_TABLET | Freq: Four times a day (QID) | ORAL | Status: DC | PRN

## 2022-02-05 MED ORDER — LOSARTAN POTASSIUM 50 MG PO TABS
50.0000 mg | ORAL_TABLET | Freq: Every day | ORAL | Status: DC
  Administered 2022-02-05: 50 mg via ORAL
  Filled 2022-02-05: qty 1

## 2022-02-05 MED ORDER — FAMOTIDINE IN NACL 20-0.9 MG/50ML-% IV SOLN
20.0000 mg | Freq: Once | INTRAVENOUS | Status: AC
  Administered 2022-02-05: 20 mg via INTRAVENOUS
  Filled 2022-02-05: qty 50

## 2022-02-05 NOTE — ED Notes (Signed)
Pt has swollen upper lip at this time.  Denies difficulty breathing.  SpO2 99% on room air.

## 2022-02-05 NOTE — ED Provider Notes (Signed)
  Physical Exam  BP (!) 107/55   Pulse (!) 59   Temp 97.9 F (36.6 C) (Oral)   Resp 12   Ht 5\' 2"  (1.575 m)   Wt 122 kg   SpO2 98%   BMI 49.19 kg/m   Physical Exam  Procedures  Procedures  ED Course / MDM    Medical Decision Making Risk OTC drugs. Prescription drug management.   Patient potential allergic reaction/angioedema.  Mild facial swelling.  Given treatment.  Feeling better.  Will discharge home with symptomatic treatment and PCP follow-up.       , MD 02/05/22 1750

## 2022-02-05 NOTE — ED Provider Notes (Signed)
[facility] EMERGENCY DEPARTMENT Provider Note  CSN: [id number] Arrival date & time: 02/05/22 1322  Chief Complaint(s) Allergic Reaction  HPI Vanessa Palmer is a 71 y.o. female with PMH CAD, CHF, CKD, DVT on Xarelto, obesity, tree nut allergy who presents emergency department for evaluation of facial swelling and concern for allergic reaction.  Patient reportedly drank a smoothie earlier this morning, then went to bed and woke up with facial swelling, urticaria, wheezing and shortness of breath.  EMS administered 0.3 mg intramuscular epinephrine and 50 mg of IV Benadryl prior to arrival and patient states that her symptoms are improving.  She arrives with lip swelling but no wheezing, no oropharyngeal swelling, airway intact with no shortness of breath.   Past Medical History Past Medical History:  Diagnosis Date   ALCOHOL ABUSE 12/13/2005   Annotation: Sober since 11/06 Qualifier: Diagnosis of  By: Wallace Cullens MD, Natalia Leatherwood     Anemia    Arthritis    "all over" (02/21/2017)   CAD (coronary artery disease)    s/p non-Q wave MI in 06/2001 and 2004, 2005   CHF (congestive heart failure) (HCC)    Chronic kidney disease (CKD), stage III (moderate) (HCC) 09/19/2010   Chronic mid back pain    Depression    DJD (degenerative joint disease)    DVT (deep venous thrombosis) (HCC)    BLE   DVT of lower extremity, bilateral (HCC) 04/04/2012   On Xarelto     GERD (gastroesophageal reflux disease)    Gout    Hyperlipidemia    Hypertension    INTRINSIC ASTHMA, WITH EXACERBATION 09/27/2009   Qualifier: Diagnosis of  By: Denton Meek MD, Nodira     Migraine    "none anymore" (02/21/2017)   Myocardial infarction (HCC) ?2005   Obesity    OSA (obstructive sleep apnea)    previously used CPAP, has lost it (02/21/2017)   Seizures (HCC)    As a teenager    Patient Active Problem List   Diagnosis Date Noted   Anxiety 10/21/2018   Shellfish allergy 03/19/2018   Unilateral primary  osteoarthritis, left knee 04/29/2017   Unilateral primary osteoarthritis, right knee 04/29/2017   Chronic stable angina 02/21/2017   LVH (left ventricular hypertrophy) 08/15/2016   Vitamin D deficiency 05/04/2016   Status post total replacement of right hip 11/23/2014   Osteoarthritis of right hip 10/13/2014   Nocturnal leg cramps 01/06/2014   Bilateral lower extremity edema 06/02/2013   History of DVT (deep vein thrombosis), multiple, lifelong anticoagulation 04/04/2012   Routine health maintenance 01/14/2012   GERD (gastroesophageal reflux disease) 01/09/2012   Urinary incontinence 12/31/2011   Chronic kidney disease (CKD), stage III (moderate) (HCC) 09/19/2010   Asthma, chronic 09/27/2009   Elevated alkaline phosphatase level 08/01/2009   Pain in joint, multiple sites 12/21/2008   MAJOR [place]PR[place] DISORDER RECURRENT EPISODE MODERATE 09/08/2008   Gout 07/05/2008   Hyperlipidemia 05/07/2006   Coronary atherosclerosis 02/05/2006   Morbid obesity (HCC) 12/13/2005   History of alcohol abuse 12/13/2005   SLEEP APNEA, OBSTRUCTIVE 12/13/2005   Essential hypertension 12/13/2005   Home Medication(s) Prior to Admission medications   Medication Sig Start Date End Date Taking? Authorizing Provider  amLODipine (NORVASC) 5 MG tablet TAKE 1 TABLET(5 MG) BY MOUTH DAILY Patient taking differently: Take 5 mg by mouth daily. 08/22/21  Yes Inez Catalina, MD  Ascorbic Acid (VITAMIN C PO) Take 1 tablet by mouth daily.   Yes [provider]  aspirin EC 81 MG  tablet Take 81 mg by mouth daily. Swallow whole.   Yes [provider]  atorvastatin (LIPITOR) 40 MG tablet TAKE 1 TABLET(40 MG) BY MOUTH DAILY Patient taking differently: Take 40 mg by mouth daily. 08/22/21  Yes [name], [name], MD  fluticasone (FLONASE) 50 MCG/ACT nasal spray Place 2 sprays into both nostrils daily as needed for allergies. 07/07/20  Yes [name], [name], MD  fluticasone (FLOVENT HFA) 44 MCG/ACT inhaler Inhale 2 puffs  into the lungs 2 (two) times daily. 08/15/20  Yes [name], [name], MD  hydrALAZINE (APRESOLINE) 50 MG tablet TAKE 1 TABLET(50 MG) BY MOUTH TWICE DAILY Patient taking differently: Take 50 mg by mouth in the morning and at bedtime. 08/22/21  Yes [name], [name], MD  losartan (COZAAR) 50 MG tablet Take 1 tablet (50 mg total) by mouth daily. 03/24/21  Yes Rehman, Areeg N, DO  metoprolol succinate (TOPROL-XL) 50 MG 24 hr tablet TAKE 1 TABLET(50 MG) BY MOUTH DAILY Patient taking differently: Take 50 mg by mouth daily. 08/22/21  Yes [name], [name], MD  nitroGLYCERIN (NITROSTAT) 0.4 MG SL tablet Place 1 tablet (0.4 mg total) under the tongue every 5 (five) minutes as needed for chest pain. 03/11/20  Yes [name], [name], MD  omeprazole (PRILOSEC) 20 MG capsule Take 1 capsule (20 mg total) by mouth daily. 08/24/20  Yes [name], [name], MD  rivaroxaban (XARELTO) 20 MG TABS tablet TAKE ONE TABLET BY MOUTH DAILY WITH SUPPER 07/21/21  Yes [name], [name], MD  sertraline (ZOLOFT) 50 MG tablet TAKE 1 TABLET(50 MG) BY MOUTH AT BEDTIME Patient taking differently: Take 50 mg by mouth at bedtime. 08/22/21  Yes [name], [name], MD  tiZANidine (ZANAFLEX) 4 MG tablet TAKE 1 TABLET(4 MG) BY MOUTH EVERY 8 HOURS AS NEEDED FOR MUSCLE SPASMS 01/25/22  Yes Kathryne HitchBlackman, Christopher Y, MD  tolterodine (DETROL LA) 2 MG 24 hr capsule TAKE 1 CAPSULE(2 MG) BY MOUTH DAILY Patient taking differently: Take 2 mg by mouth daily. 08/22/21  Yes [name], [name], MD  triamcinolone cream (KENALOG) 0.1 % Apply 1 application topically 2 (two) times daily. To front of legs. 08/24/20  Yes [name], [name], MD  oxyCODONE-acetaminophen (PERCOCET) 10-325 MG tablet Take 1 tablet by mouth every 6 (six) hours as needed for pain (chronic arthritis pain, multiple sites, diag code M16.11, M25.50). 07/21/21 08/20/21  [name], [name], MD  potassium chloride SA (KLOR-CON M) 20 MEQ tablet Take 20 mEq by mouth daily. Patient not taking: Reported on 02/05/2022 05/30/21    [provider]  torsemide (DEMADEX) 20 MG tablet Take 20 mg by mouth 2 (two) times daily. Patient not taking: Reported on 02/05/2022 05/31/21   [provider]                                                                                                                                    Past Surgical History Past Surgical  History:  Procedure Laterality Date   ABDOMINAL HYSTERECTOMY     partial; due to endometriosis   CARDIAC CATHETERIZATION  06/2001, 02/2002, 10/2004    (10-15% proximal stenosis of circumflex, diffuse disease of  OM1 and RCA)   CARDIAC CATHETERIZATION  02/21/2017   COLONOSCOPY     JOINT REPLACEMENT     KNEE ARTHROSCOPY Left    LEFT HEART CATH AND CORONARY ANGIOGRAPHY N/A 02/21/2017   Procedure: LEFT HEART CATH AND CORONARY ANGIOGRAPHY;  Surgeon: Rinaldo Cloud, MD;  Location: [facility] IN[facility]AS[facility];  Service: Cardiovascular;  Laterality: N/A;   TOTAL HIP ARTHROPLASTY Right 11/23/2014   Procedure: RIGHT TOTAL HIP ARTHROPLASTY ANTERIOR APPROACH;  Surgeon: Kathryne Hitch, MD;  Location: [facility] OR;  Service: Orthopedics;  Laterality: Right;   TUBAL LIGATION     Family History Family History  Problem Relation Age of Onset   Mental illness Mother    Heart disease Father    Diabetes Sister    Heart disease Sister    Diabetes Sister    Mental illness Sister    Heart disease Brother    Hypertension Daughter    Heart disease Daughter     Social History Social History   Tobacco Use   Smoking status: Never   Smokeless tobacco: Never  Vaping Use   Vaping Use: Never used  Substance Use Topics   Alcohol use: Not Currently    Alcohol/week: 12.0 standard drinks of alcohol    Types: 6 Cans of beer, 6 Shots of liquor per week    Comment: x 1 month.   Drug use: No   Allergies Ace inhibitors, Other, Regadenoson, Shrimp [shellfish allergy], Peanut oil, Hydrocodone-acetaminophen, Hydromorphone hcl, Oxycodone-acetaminophen, Propoxyphene n-acetaminophen, and  Vicodin [hydrocodone-acetaminophen]  Review of Systems Review of Systems  HENT:  Positive for facial swelling.   Respiratory:  Positive for shortness of breath and wheezing.   Skin:  Positive for rash.   Physical Exam Vital Signs  I have reviewed the triage vital signs BP (!) 186/96   Pulse 72   Temp 97.7 F (36.5 C) (Oral)   Resp 19   Ht 5\' 2"  (1.575 m)   Wt 122 kg   SpO2 100%   BMI 49.19 kg/m   Physical Exam Vitals and nursing note reviewed.  Constitutional:      General: She is not in acute distress.    Appearance: She is well-developed.  HENT:     Head: Normocephalic and atraumatic.     Comments: Lip swelling Eyes:     Conjunctiva/sclera: Conjunctivae normal.  Cardiovascular:     Rate and Rhythm: Normal rate and regular rhythm.     Heart sounds: No murmur heard. Pulmonary:     Effort: Pulmonary effort is normal. No respiratory distress.     Breath sounds: Normal breath sounds.  Abdominal:     Palpations: Abdomen is soft.     Tenderness: There is no abdominal tenderness.  Musculoskeletal:        General: No swelling.     Cervical back: Neck supple.  Skin:    General: Skin is warm and dry.     Capillary Refill: Capillary refill takes less than 2 seconds.  Neurological:     Mental Status: She is alert.  Psychiatric:        Mood and Affect: Mood normal.     ED Results and Treatments Labs (all labs ordered are listed, but only abnormal results are displayed) Labs Reviewed - No data to display  Radiology No results found.  Pertinent labs & imaging results that were available during my care of the patient were reviewed by me and considered in my medical decision making (see MDM for details).  Medications Ordered in ED Medications  methylPREDNISolone sodium succinate (SOLU-MEDROL) 125 mg/2 mL injection 125 mg (125 mg Intravenous Given  02/05/22 1337)  famotidine (PEPCID) IVPB 20 mg premix (0 mg Intravenous Stopped 02/05/22 1412)                                                                                                                                     Procedures .Critical Care  Performed by: Glendora Score, MD Authorized by: Glendora Score, MD   Critical care provider statement:    Critical care time (minutes):  30   Critical care was necessary to treat or prevent imminent or life-threatening deterioration of the following conditions: anaphylaxis.   Critical care was time spent personally by me on the following activities:  Development of treatment plan with patient or surrogate, discussions with consultants, evaluation of patient's response to treatment, examination of patient, ordering and review of laboratory studies, ordering and review of radiographic studies, ordering and performing treatments and interventions, pulse oximetry, re-evaluation of patient's condition and review of old charts   (including critical care time)  Medical Decision Making / ED Course   This patient presents to the ED for concern of allergic reaction, this involves an extensive number of treatment options, and is a complaint that carries with it a high risk of complications and morbidity.  The differential diagnosis includes allergic reaction, anaphylaxis, angioedema  MDM: Patient seen the emergency room for evaluation of an allergic reaction.  Physical exam with lip swelling but is otherwise unremarkable.  Patient received Methylpred and famotidine and will be observed in the emergency department for approximately 4 hours.  Patient then signed out to oncoming states patient then signed out to oncoming provider.  Please see provider sign out note for continuation of workup.  Anticipate discharge if symptoms improved by 5:30 PM.   Additional history obtained: -Additional history obtained from multiple family members -External records  from outside source obtained and reviewed including: Chart review including previous notes, labs, imaging, consultation notes   Lab Tests: -I ordered, reviewed, and interpreted labs.   The pertinent results include:   Labs Reviewed - No data to display     Medicines ordered and prescription drug management: Meds ordered this encounter  Medications   methylPREDNISolone sodium succinate (SOLU-MEDROL) 125 mg/2 mL injection 125 mg   famotidine (PEPCID) IVPB 20 mg premix    -I have reviewed the patients home medicines and have made adjustments as needed  Critical interventions Steroids, Pepcid  Cardiac Monitoring: The patient was maintained on a cardiac monitor.  I personally viewed and interpreted the cardiac monitored which showed an underlying rhythm of: NSR  Social Determinants of Health:  Factors impacting patients care include:  none   Reevaluation: After the interventions noted above, I reevaluated the patient and found that they have :improved  Co morbidities that complicate the patient evaluation  Past Medical History:  Diagnosis Date   ALCOHOL ABUSE 12/13/2005   Annotation: Sober since 11/06 Qualifier: Diagnosis of  By: Wallace Cullens MD, Natalia Leatherwood     Anemia    Arthritis    "all over" (02/21/2017)   CAD (coronary artery disease)    s/p non-Q wave MI in 06/2001 and 2004, 2005   CHF (congestive heart failure) (HCC)    Chronic kidney disease (CKD), stage III (moderate) (HCC) 09/19/2010   Chronic mid back pain    Depression    DJD (degenerative joint disease)    DVT (deep venous thrombosis) (HCC)    BLE   DVT of lower extremity, bilateral (HCC) 04/04/2012   On Xarelto     GERD (gastroesophageal reflux disease)    Gout    Hyperlipidemia    Hypertension    INTRINSIC ASTHMA, WITH EXACERBATION 09/27/2009   Qualifier: Diagnosis of  By: Denton Meek MD, Nodira     Migraine    "none anymore" (02/21/2017)   Myocardial infarction (HCC) ?2005   Obesity    OSA (obstructive sleep apnea)     previously used CPAP, has lost it (02/21/2017)   Seizures (HCC)    As a teenager       Dispostion: I considered admission for this patient, and disposition will be pending ER observation.  Please see provider signout for continuation of workup.     Final Clinical Impression(s) / ED Diagnoses Final diagnoses:  None     @PCDICTATION @    , MD 02/05/22 1457

## 2022-02-05 NOTE — ED Triage Notes (Signed)
Pt BIB [facility] from home due to drinking a smoothie that gave her an allergic reaction.  Pt states that her mouth got swollen and she started to itch and getting hives.  Pt got 4mg  of Zofran, 0.3 Epi and 50 of Benadryl. 18 left arm AC

## 2022-02-06 DIAGNOSIS — Z0289 Encounter for other administrative examinations: Secondary | ICD-10-CM | POA: Diagnosis not present

## 2022-02-06 DIAGNOSIS — L272 Dermatitis due to ingested food: Secondary | ICD-10-CM | POA: Diagnosis not present

## 2022-02-12 ENCOUNTER — Other Ambulatory Visit: Payer: Self-pay

## 2022-02-12 ENCOUNTER — Telehealth: Payer: Self-pay | Admitting: Orthopaedic Surgery

## 2022-02-12 MED ORDER — TIZANIDINE HCL 4 MG PO TABS: ORAL_TABLET | ORAL | 0 refills | Status: DC

## 2022-02-12 NOTE — Telephone Encounter (Signed)
Patient called needing Rx refilled Tizanidine. Patient said she uses [facility] on [address]. The number to contact patient is [phone]

## 2022-02-13 DIAGNOSIS — Z0289 Encounter for other administrative examinations: Secondary | ICD-10-CM | POA: Diagnosis not present

## 2022-02-13 DIAGNOSIS — J449 Chronic obstructive pulmonary disease, unspecified: Secondary | ICD-10-CM | POA: Diagnosis not present

## 2022-02-13 DIAGNOSIS — L272 Dermatitis due to ingested food: Secondary | ICD-10-CM | POA: Diagnosis not present

## 2022-02-13 DIAGNOSIS — G4733 Obstructive sleep apnea (adult) (pediatric): Secondary | ICD-10-CM | POA: Diagnosis not present

## 2022-02-24 ENCOUNTER — Other Ambulatory Visit: Payer: Self-pay | Admitting: Internal Medicine

## 2022-02-24 DIAGNOSIS — F331 Major depressive disorder, recurrent, moderate: Secondary | ICD-10-CM

## 2022-03-05 ENCOUNTER — Telehealth: Payer: Self-pay | Admitting: Orthopaedic Surgery

## 2022-03-05 ENCOUNTER — Other Ambulatory Visit: Payer: Self-pay | Admitting: Physician Assistant

## 2022-03-05 MED ORDER — TIZANIDINE HCL 4 MG PO TABS: ORAL_TABLET | ORAL | 0 refills | Status: DC

## 2022-03-05 NOTE — Telephone Encounter (Signed)
Pt called requesting a refill of muscle relaxer. [facility] [address] . Pt phone number is [phone]

## 2022-03-05 NOTE — Telephone Encounter (Signed)
I called and talked to the pt she actually [facility] [address] [facility]. I called and the rx is now at the [facility]

## 2022-03-14 DIAGNOSIS — D6869 Other thrombophilia: Secondary | ICD-10-CM | POA: Diagnosis not present

## 2022-03-14 DIAGNOSIS — I82503 Chronic embolism and thrombosis of unspecified deep veins of lower extremity, bilateral: Secondary | ICD-10-CM | POA: Diagnosis not present

## 2022-03-14 DIAGNOSIS — N1832 Chronic kidney disease, stage 3b: Secondary | ICD-10-CM | POA: Diagnosis not present

## 2022-03-14 DIAGNOSIS — G4700 Insomnia, unspecified: Secondary | ICD-10-CM | POA: Diagnosis not present

## 2022-03-14 DIAGNOSIS — Z0289 Encounter for other administrative examinations: Secondary | ICD-10-CM | POA: Diagnosis not present

## 2022-03-14 DIAGNOSIS — I700 Atherosclerosis of aorta: Secondary | ICD-10-CM | POA: Diagnosis not present

## 2022-03-14 DIAGNOSIS — J449 Chronic obstructive pulmonary disease, unspecified: Secondary | ICD-10-CM | POA: Diagnosis not present

## 2022-03-14 DIAGNOSIS — I25119 Atherosclerotic heart disease of native coronary artery with unspecified angina pectoris: Secondary | ICD-10-CM | POA: Diagnosis not present

## 2022-03-22 DIAGNOSIS — Z5181 Encounter for therapeutic drug level monitoring: Secondary | ICD-10-CM | POA: Diagnosis not present

## 2022-03-27 ENCOUNTER — Other Ambulatory Visit: Payer: Self-pay | Admitting: Orthopaedic Surgery

## 2022-03-27 ENCOUNTER — Telehealth: Payer: Self-pay | Admitting: Orthopaedic Surgery

## 2022-03-27 DIAGNOSIS — Z5181 Encounter for therapeutic drug level monitoring: Secondary | ICD-10-CM | POA: Diagnosis not present

## 2022-03-27 MED ORDER — TIZANIDINE HCL 4 MG PO TABS: ORAL_TABLET | ORAL | 0 refills | Status: DC

## 2022-03-27 NOTE — Telephone Encounter (Signed)
Patient would like tizandien  called into pharmacy. Patient spelled this medication

## 2022-03-30 ENCOUNTER — Ambulatory Visit (INDEPENDENT_AMBULATORY_CARE_PROVIDER_SITE_OTHER): Payer: 59 | Admitting: Physician Assistant

## 2022-03-30 ENCOUNTER — Encounter: Payer: Self-pay | Admitting: Physician Assistant

## 2022-03-30 DIAGNOSIS — M1611 Unilateral primary osteoarthritis, right hip: Secondary | ICD-10-CM

## 2022-03-30 NOTE — Progress Notes (Signed)
Office Visit Note   Patient: Vanessa Palmer           Date of Birth: [birth year]           MRN: [id number] Visit Date: 03/30/2022              Requested by: No referring provider defined for this encounter. PCP: No primary care provider on file.  Chief Complaint  Patient presents with   Lower Back - Pain      HPI:  Pleasant 72 year old patient who is a patient of Dr. Trevor Mace.  She has been Tinizidine which she has been giving her.  She called earlier this week requesting a refill.  She thought it had been refilled but her pharmacy did not have the prescription.  When she called back she was told by the front desk that she had to be seen today.  No new complaints. Assessment & Plan: Visit Diagnoses: No diagnosis found.  Plan: I apologized to the patient and she was little upset.  It looks like her refill was called in just simply to the wrong pharmacy.  Told her to contact us if there is any concerns  Follow-Up Instructions: No follow-ups on file.   Ortho Exam  Patient is alert, oriented, no adenopathy, well-dressed, normal affect, normal respiratory effort.   Imaging: No results found. No images are attached to the encounter.  Labs: Lab Results  Component Value Date   HGBA1C 5.5 12/16/2014   HGBA1C 5.7 01/06/2014   HGBA1C 5.8 (H) 12/31/2011   ESRSEDRATE 55 (H) 09/14/2021   ESRSEDRATE 53 (H) 05/12/2014   ESRSEDRATE 62 (H) 12/22/2008   CRP 6.9 09/14/2021   LABURIC 9.0 (H) 08/24/2020   LABURIC 8.3 (H) 12/31/2011   LABURIC 6.3 05/05/2009   REPTSTATUS 08/12/2019 FINAL 08/09/2019   CULT >=100,000 COLONIES/mL KLEBSIELLA PNEUMONIAE (A) 08/09/2019   LABORGA KLEBSIELLA PNEUMONIAE (A) 08/09/2019     Lab Results  Component Value Date   ALBUMIN 4.1 07/21/2021   ALBUMIN 4.3 03/24/2021   ALBUMIN 4.2 12/09/2020    Lab Results  Component Value Date   MG 1.8 04/06/2012   MG 1.7 04/05/2012   MG 1.6 04/04/2012   Lab Results  Component Value Date   VD25OH 42.5  03/24/2021   VD25OH 26.8 (L) 08/24/2020   VD25OH 35.3 12/11/2019    No results found for: "PREALBUMIN"    Latest Ref Rng & Units 09/14/2021    3:56 PM 08/24/2020   10:04 AM 12/11/2019   11:26 AM  CBC EXTENDED  WBC 3.8 - 10.8 Thousand/uL 5.2  5.5  5.0   RBC 3.80 - 5.10 Million/uL 3.65  3.97  3.55   Hemoglobin 11.7 - 15.5 g/dL 11.3  12.6  11.2   HCT 35.0 - 45.0 % 33.6  37.4  32.8   Platelets 140 - 400 Thousand/uL 203  194  235   NEUT# 1,500 - 7,800 cells/uL 3,286     Lymph# 850 - 3,900 cells/uL 1,362        There is no height or weight on file to calculate BMI.  Orders:  No orders of the defined types were placed in this encounter.  No orders of the defined types were placed in this encounter.    Procedures: No procedures performed  Clinical Data: No additional findings.  ROS:  All other systems negative, except as noted in the HPI. Review of Systems  Objective: Vital Signs: There were no vitals taken for this visit.  Specialty Comments:  No specialty comments available.  PMFS History: Patient Active Problem List   Diagnosis Date Noted   Anxiety 10/21/2018   Shellfish allergy 03/19/2018   Unilateral primary osteoarthritis, left knee 04/29/2017   Unilateral primary osteoarthritis, right knee 04/29/2017   Chronic stable angina 02/21/2017   LVH (left ventricular hypertrophy) 08/15/2016   Vitamin D deficiency 05/04/2016   Status post total replacement of right hip 11/23/2014   Osteoarthritis of right hip 10/13/2014   Nocturnal leg cramps 01/06/2014   Bilateral lower extremity edema 06/02/2013   History of DVT (deep vein thrombosis), multiple, lifelong anticoagulation 04/04/2012   Routine health maintenance 01/14/2012   GERD (gastroesophageal reflux disease) 01/09/2012   Urinary incontinence 12/31/2011   Chronic kidney disease (CKD), stage III (moderate) ([place]) 09/19/2010   Asthma, chronic 09/27/2009   Elevated alkaline phosphatase level 08/01/2009   Pain in  joint, multiple sites 12/21/2008   MAJOR [place] RECURRENT EPISODE MODERATE 09/08/2008   Gout 07/05/2008   Hyperlipidemia 05/07/2006   Coronary atherosclerosis 02/05/2006   Morbid obesity ([place]) 12/13/2005   History of alcohol abuse 12/13/2005   SLEEP APNEA, OBSTRUCTIVE 12/13/2005   Essential hypertension 12/13/2005   Past Medical History:  Diagnosis Date   ALCOHOL ABUSE 12/13/2005   Annotation: Sober since 11/06 Qualifier: Diagnosis of  By: Pearline Cables MD, Belenda Cruise     Anemia    Arthritis    "all over" (02/21/2017)   CAD (coronary artery disease)    s/p non-Q wave MI in 06/2001 and 2004, 2005   CHF (congestive heart failure) (HCC)    Chronic kidney disease (CKD), stage III (moderate) ([place]) 09/19/2010   Chronic mid back pain    Depression    DJD (degenerative joint disease)    DVT (deep venous thrombosis) (HCC)    BLE   DVT of lower extremity, bilateral ([place]) 04/04/2012   On Xarelto     GERD (gastroesophageal reflux disease)    Gout    Hyperlipidemia    Hypertension    INTRINSIC ASTHMA, WITH EXACERBATION 09/27/2009   Qualifier: Diagnosis of  By: Stanford Scotland MD, Nodira     Migraine    "none anymore" (02/21/2017)   Myocardial infarction ([place]) ?2005   Obesity    OSA (obstructive sleep apnea)    previously used CPAP, has lost it (02/21/2017)   Seizures ([place])    As a teenager     Family History  Problem Relation Age of Onset   Mental illness Mother    Heart disease Father    Diabetes Sister    Heart disease Sister    Diabetes Sister    Mental illness Sister    Heart disease Brother    Hypertension Daughter    Heart disease Daughter     Past Surgical History:  Procedure Laterality Date   ABDOMINAL HYSTERECTOMY     partial; due to endometriosis   CARDIAC CATHETERIZATION  06/2001, 02/2002, 10/2004    (10-15% proximal stenosis of circumflex, diffuse disease of  OM1 and RCA)   CARDIAC CATHETERIZATION  02/21/2017   COLONOSCOPY     JOINT REPLACEMENT     KNEE ARTHROSCOPY Left     LEFT HEART CATH AND CORONARY ANGIOGRAPHY N/A 02/21/2017   Procedure: LEFT HEART CATH AND CORONARY ANGIOGRAPHY;  Surgeon: Charolette Forward, MD;  Location: [facility];  Service: Cardiovascular;  Laterality: N/A;   TOTAL HIP ARTHROPLASTY Right 11/23/2014   Procedure: RIGHT TOTAL HIP ARTHROPLASTY ANTERIOR APPROACH;  Surgeon: Mcarthur Rossetti, MD;  Location: 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   Drug use: No   Sexual activity: Not on file

## 2022-04-02 DIAGNOSIS — Z5181 Encounter for therapeutic drug level monitoring: Secondary | ICD-10-CM | POA: Diagnosis not present

## 2022-04-09 DIAGNOSIS — Z5181 Encounter for therapeutic drug level monitoring: Secondary | ICD-10-CM | POA: Diagnosis not present

## 2022-04-10 DIAGNOSIS — G8929 Other chronic pain: Secondary | ICD-10-CM | POA: Diagnosis not present

## 2022-04-10 DIAGNOSIS — G4700 Insomnia, unspecified: Secondary | ICD-10-CM | POA: Diagnosis not present

## 2022-04-10 DIAGNOSIS — Z0289 Encounter for other administrative examinations: Secondary | ICD-10-CM | POA: Diagnosis not present

## 2022-04-16 DIAGNOSIS — Z5181 Encounter for therapeutic drug level monitoring: Secondary | ICD-10-CM | POA: Diagnosis not present

## 2022-04-23 ENCOUNTER — Other Ambulatory Visit: Payer: Self-pay | Admitting: Physician Assistant

## 2022-04-23 ENCOUNTER — Telehealth: Payer: Self-pay | Admitting: Physician Assistant

## 2022-04-23 MED ORDER — TIZANIDINE HCL 4 MG PO TABS: ORAL_TABLET | ORAL | 0 refills | Status: DC

## 2022-04-23 NOTE — Telephone Encounter (Signed)
Called and advised pt.

## 2022-04-23 NOTE — Telephone Encounter (Signed)
Pt called requesting a refill of muscle relaxer been sent to [facility] on [address]. Pt phone number is [phone]

## 2022-04-27 DIAGNOSIS — Z5181 Encounter for therapeutic drug level monitoring: Secondary | ICD-10-CM | POA: Diagnosis not present

## 2022-04-30 DIAGNOSIS — G8929 Other chronic pain: Secondary | ICD-10-CM | POA: Diagnosis not present

## 2022-04-30 DIAGNOSIS — M25562 Pain in left knee: Secondary | ICD-10-CM | POA: Diagnosis not present

## 2022-04-30 DIAGNOSIS — I890 Lymphedema, not elsewhere classified: Secondary | ICD-10-CM | POA: Diagnosis not present

## 2022-04-30 DIAGNOSIS — M5451 Vertebrogenic low back pain: Secondary | ICD-10-CM | POA: Diagnosis not present

## 2022-05-01 ENCOUNTER — Other Ambulatory Visit: Payer: Self-pay | Admitting: Physical Medicine & Rehabilitation

## 2022-05-01 DIAGNOSIS — M5416 Radiculopathy, lumbar region: Secondary | ICD-10-CM

## 2022-05-03 ENCOUNTER — Encounter: Payer: Self-pay | Admitting: Radiology

## 2022-05-14 ENCOUNTER — Telehealth: Payer: Self-pay | Admitting: Physician Assistant

## 2022-05-14 ENCOUNTER — Other Ambulatory Visit: Payer: Self-pay

## 2022-05-14 MED ORDER — TIZANIDINE HCL 4 MG PO TABS: ORAL_TABLET | ORAL | 0 refills | Status: DC

## 2022-05-14 NOTE — Telephone Encounter (Signed)
Pt called requesting a refill of muscle relaxer. PLEASE SEND REFILL TO [facility] ON [address]. PT STATES PHARMACY ON FILE IS TOO FAR FROM HER HOME. Pt phone number is [phone]. Pt states [name] is the providers who prescribe this med for her.

## 2022-05-22 ENCOUNTER — Ambulatory Visit
Admission: RE | Admit: 2022-05-22 | Discharge: 2022-05-22 | Disposition: A | Discharge status: Home / Self Care | Payer: 59 | Source: Ambulatory Visit | Attending: Physical Medicine & Rehabilitation | Admitting: Physical Medicine & Rehabilitation

## 2022-05-22 DIAGNOSIS — M5416 Radiculopathy, lumbar region: Secondary | ICD-10-CM | POA: Diagnosis not present

## 2022-05-22 DIAGNOSIS — M545 Low back pain, unspecified: Secondary | ICD-10-CM | POA: Diagnosis not present

## 2022-05-22 DIAGNOSIS — M48061 Spinal stenosis, lumbar region without neurogenic claudication: Secondary | ICD-10-CM | POA: Diagnosis not present

## 2022-05-22 DIAGNOSIS — M47816 Spondylosis without myelopathy or radiculopathy, lumbar region: Secondary | ICD-10-CM | POA: Diagnosis not present

## 2022-05-22 DIAGNOSIS — M4316 Spondylolisthesis, lumbar region: Secondary | ICD-10-CM | POA: Diagnosis not present

## 2022-05-28 DIAGNOSIS — M5416 Radiculopathy, lumbar region: Secondary | ICD-10-CM | POA: Diagnosis not present

## 2022-05-29 DIAGNOSIS — I10 Essential (primary) hypertension: Secondary | ICD-10-CM | POA: Diagnosis not present

## 2022-05-29 DIAGNOSIS — R61 Generalized hyperhidrosis: Secondary | ICD-10-CM | POA: Diagnosis not present

## 2022-05-29 DIAGNOSIS — S81809A Unspecified open wound, unspecified lower leg, initial encounter: Secondary | ICD-10-CM | POA: Diagnosis not present

## 2022-05-29 DIAGNOSIS — I890 Lymphedema, not elsewhere classified: Secondary | ICD-10-CM | POA: Diagnosis not present

## 2022-05-29 DIAGNOSIS — G8929 Other chronic pain: Secondary | ICD-10-CM | POA: Diagnosis not present

## 2022-05-29 DIAGNOSIS — Z0289 Encounter for other administrative examinations: Secondary | ICD-10-CM | POA: Diagnosis not present

## 2022-06-07 ENCOUNTER — Other Ambulatory Visit: Payer: Self-pay | Admitting: Internal Medicine

## 2022-06-07 DIAGNOSIS — I10 Essential (primary) hypertension: Secondary | ICD-10-CM

## 2022-06-07 NOTE — Telephone Encounter (Signed)
Dr. Henri Medal is a pain specialist.  Can you call the patient and see if she still wants to come to [name] for PCP.  I don't think that person should be listed as PCP!    Thanks

## 2022-06-07 NOTE — Telephone Encounter (Signed)
RTC to patient has been seeing a Dr. Melina Fiddler in [place].  Is not happy with this doctor and would like to return to the Clinics. Has an upcoming appointment with this doctor.  Transferred to the front office to schedule an appointment in [facility].

## 2022-06-17 ENCOUNTER — Encounter: Payer: Self-pay | Admitting: *Deleted

## 2022-06-27 ENCOUNTER — Other Ambulatory Visit: Payer: Self-pay | Admitting: Orthopaedic Surgery

## 2022-06-27 ENCOUNTER — Telehealth: Payer: Self-pay | Admitting: Physician Assistant

## 2022-06-27 MED ORDER — TIZANIDINE HCL 4 MG PO TABS: ORAL_TABLET | ORAL | 0 refills | Status: DC

## 2022-06-27 NOTE — Telephone Encounter (Signed)
Patient wants a refill in her medication ( Muscle relaxer)

## 2022-06-28 ENCOUNTER — Emergency Department (HOSPITAL_COMMUNITY): Payer: 59

## 2022-06-28 ENCOUNTER — Emergency Department (HOSPITAL_COMMUNITY)
Admission: EM | Admit: 2022-06-28 | Discharge: 2022-06-29 | Disposition: A | Discharge status: Home / Self Care | Payer: 59 | Attending: Emergency Medicine | Admitting: Emergency Medicine

## 2022-06-28 ENCOUNTER — Encounter (HOSPITAL_COMMUNITY): Payer: Self-pay

## 2022-06-28 ENCOUNTER — Other Ambulatory Visit: Payer: Self-pay

## 2022-06-28 DIAGNOSIS — Z79899 Other long term (current) drug therapy: Secondary | ICD-10-CM | POA: Insufficient documentation

## 2022-06-28 DIAGNOSIS — R6889 Other general symptoms and signs: Secondary | ICD-10-CM | POA: Diagnosis not present

## 2022-06-28 DIAGNOSIS — I130 Hypertensive heart and chronic kidney disease with heart failure and stage 1 through stage 4 chronic kidney disease, or unspecified chronic kidney disease: Secondary | ICD-10-CM | POA: Insufficient documentation

## 2022-06-28 DIAGNOSIS — Z7901 Long term (current) use of anticoagulants: Secondary | ICD-10-CM | POA: Diagnosis not present

## 2022-06-28 DIAGNOSIS — F22 Delusional disorders: Secondary | ICD-10-CM | POA: Diagnosis not present

## 2022-06-28 DIAGNOSIS — R0689 Other abnormalities of breathing: Secondary | ICD-10-CM | POA: Diagnosis not present

## 2022-06-28 DIAGNOSIS — R0602 Shortness of breath: Secondary | ICD-10-CM

## 2022-06-28 DIAGNOSIS — I2510 Atherosclerotic heart disease of native coronary artery without angina pectoris: Secondary | ICD-10-CM | POA: Diagnosis not present

## 2022-06-28 DIAGNOSIS — R0600 Dyspnea, unspecified: Secondary | ICD-10-CM | POA: Diagnosis not present

## 2022-06-28 DIAGNOSIS — Z7951 Long term (current) use of inhaled steroids: Secondary | ICD-10-CM | POA: Insufficient documentation

## 2022-06-28 DIAGNOSIS — R0789 Other chest pain: Secondary | ICD-10-CM | POA: Diagnosis not present

## 2022-06-28 DIAGNOSIS — J45909 Unspecified asthma, uncomplicated: Secondary | ICD-10-CM | POA: Insufficient documentation

## 2022-06-28 DIAGNOSIS — Z743 Need for continuous supervision: Secondary | ICD-10-CM | POA: Diagnosis not present

## 2022-06-28 DIAGNOSIS — R451 Restlessness and agitation: Secondary | ICD-10-CM | POA: Insufficient documentation

## 2022-06-28 DIAGNOSIS — Z7982 Long term (current) use of aspirin: Secondary | ICD-10-CM | POA: Diagnosis not present

## 2022-06-28 DIAGNOSIS — N189 Chronic kidney disease, unspecified: Secondary | ICD-10-CM | POA: Insufficient documentation

## 2022-06-28 DIAGNOSIS — Y906 Blood alcohol level of 120-199 mg/100 ml: Secondary | ICD-10-CM | POA: Insufficient documentation

## 2022-06-28 DIAGNOSIS — I509 Heart failure, unspecified: Secondary | ICD-10-CM | POA: Diagnosis not present

## 2022-06-28 DIAGNOSIS — R609 Edema, unspecified: Secondary | ICD-10-CM | POA: Diagnosis not present

## 2022-06-28 LAB — CBC WITH DIFFERENTIAL/PLATELET
Abs Immature Granulocytes: 0.01 10*3/uL (ref 0.00–0.07)
Basophils Absolute: 0 10*3/uL (ref 0.0–0.1)
Basophils Relative: 1 %
Eosinophils Absolute: 0.2 10*3/uL (ref 0.0–0.5)
Eosinophils Relative: 3 %
HCT: 36.1 % (ref 36.0–46.0)
Hemoglobin: 12.3 g/dL (ref 12.0–15.0)
Immature Granulocytes: 0 %
Lymphocytes Relative: 30 %
Lymphs Abs: 1.9 10*3/uL (ref 0.7–4.0)
MCH: 30.8 pg (ref 26.0–34.0)
MCHC: 34.1 g/dL (ref 30.0–36.0)
MCV: 90.3 fL (ref 80.0–100.0)
Monocytes Absolute: 0.5 10*3/uL (ref 0.1–1.0)
Monocytes Relative: 8 %
Neutro Abs: 3.8 10*3/uL (ref 1.7–7.7)
Neutrophils Relative %: 58 %
Platelets: 272 10*3/uL (ref 150–400)
RBC: 4 MIL/uL (ref 3.87–5.11)
RDW: 13.8 % (ref 11.5–15.5)
WBC: 6.4 10*3/uL (ref 4.0–10.5)
nRBC: 0 % (ref 0.0–0.2)

## 2022-06-28 LAB — RAPID URINE DRUG SCREEN, HOSP PERFORMED
Amphetamines: NOT DETECTED
Barbiturates: NOT DETECTED
Benzodiazepines: NOT DETECTED
Cocaine: NOT DETECTED
Opiates: NOT DETECTED
Tetrahydrocannabinol: NOT DETECTED

## 2022-06-28 LAB — ETHANOL: Alcohol, Ethyl (B): 125 mg/dL — ABNORMAL HIGH (ref <10)

## 2022-06-28 MED ORDER — IPRATROPIUM BROMIDE 0.02 % IN SOLN
0.5000 mg | Freq: Once | RESPIRATORY_TRACT | Status: AC
  Administered 2022-06-28: 0.5 mg via RESPIRATORY_TRACT
  Filled 2022-06-28: qty 2.5

## 2022-06-28 MED ORDER — ALBUTEROL SULFATE (2.5 MG/3ML) 0.083% IN NEBU
5.0000 mg | INHALATION_SOLUTION | Freq: Once | RESPIRATORY_TRACT | Status: AC
  Administered 2022-06-28: 5 mg via RESPIRATORY_TRACT
  Filled 2022-06-28: qty 6

## 2022-06-28 MED ORDER — TIZANIDINE HCL 4 MG PO TABS
4.0000 mg | ORAL_TABLET | Freq: Once | ORAL | Status: AC
  Administered 2022-06-28: 4 mg via ORAL
  Filled 2022-06-28: qty 1

## 2022-06-28 MED ORDER — LORAZEPAM 1 MG PO TABS
1.0000 mg | ORAL_TABLET | Freq: Once | ORAL | Status: AC
  Administered 2022-06-28: 1 mg via ORAL
  Filled 2022-06-28: qty 1

## 2022-06-28 NOTE — ED Notes (Signed)
Pt c/o 10/10 RLE pain. Provider made aware.

## 2022-06-28 NOTE — ED Notes (Signed)
Recollected labs, Per Main Lab initial labs hemolyzed.

## 2022-06-28 NOTE — ED Notes (Signed)
Pt transported to xray 

## 2022-06-28 NOTE — ED Triage Notes (Signed)
Patient Vanessa Palmer from home with complaint of asthma attack.   EMS placed patient on O2 12L Non rebreather.  Patient unable to use rescue inhaler because it was out of date.   EMS VS BP 190/110  Patient denies CP.

## 2022-06-28 NOTE — ED Provider Notes (Signed)
[place] EMERGENCY DEPARTMENT AT [facility] Provider Note   CSN: [id number] Arrival date & time: 06/28/22  2159     History  Chief Complaint  Patient presents with   Shortness of Breath   Asthma    Vanessa Palmer is a 72 y.o. female.  72 year old female with a history of hyperlipidemia, hypertension, CAD, asthma, CHF (LVEF 60-65% in 2021), VTE (on chronic Xarelto), CKD, ETOH abuse, and chronic pain presents to the emergency department for evaluation of shortness of breath.  States that she was playing with her godchild when she began to feel short of breath with some mild chest tightness.  She went back home to use her rescue inhaler, but realized that it was expired.  Subsequently called 911.  Upon EMS arrival, [facility] was administering supplemental oxygen at 6 L via nasal cannula.  EMS then placed the patient on 12 L oxygen via nonrebreather.  She was not given any nebulizer treatments en route.  Patient states that her shortness of breath has spontaneously improved.  She had no associated chest pain.  She denies new or worsening leg swelling, associated fevers.  Difficult to get more detailed history as speech is tangential and patient is very distractible.  The history is provided by the patient. No language interpreter was used.  Shortness of Breath Asthma Associated symptoms include shortness of breath.       Home Medications Prior to Admission medications   Medication Sig Start Date End Date Taking? Authorizing Provider  amLODipine (NORVASC) 5 MG tablet TAKE 1 TABLET(5 MG) BY MOUTH DAILY Patient taking differently: Take 5 mg by mouth daily. 08/22/21   Inez Catalina, MD  Ascorbic Acid (VITAMIN C PO) Take 1 tablet by mouth daily.    [provider]  aspirin EC 81 MG tablet Take 81 mg by mouth daily. Swallow whole.    [provider]  atorvastatin (LIPITOR) 40 MG tablet TAKE 1 TABLET(40 MG) BY MOUTH DAILY Patient taking differently: Take 40 mg by  mouth daily. 08/22/21   Inez Catalina, MD  fluticasone (FLONASE) 50 MCG/ACT nasal spray Place 2 sprays into both nostrils daily as needed for allergies. 07/07/20   Inez Catalina, MD  fluticasone (FLOVENT HFA) 44 MCG/ACT inhaler Inhale 2 puffs into the lungs 2 (two) times daily. 08/15/20   Inez Catalina, MD  hydrALAZINE (APRESOLINE) 50 MG tablet TAKE 1 TABLET(50 MG) BY MOUTH TWICE DAILY Patient taking differently: Take 50 mg by mouth in the morning and at bedtime. 08/22/21   Inez Catalina, MD  losartan (COZAAR) 50 MG tablet TAKE 1 TABLET(50 MG) BY MOUTH DAILY 06/07/22   Inez Catalina, MD  metoprolol succinate (TOPROL-XL) 50 MG 24 hr tablet TAKE 1 TABLET(50 MG) BY MOUTH DAILY Patient taking differently: Take 50 mg by mouth daily. 08/22/21   Inez Catalina, MD  nitroGLYCERIN (NITROSTAT) 0.4 MG SL tablet Place 1 tablet (0.4 mg total) under the tongue every 5 (five) minutes as needed for chest pain. 03/11/20   Inez Catalina, MD  omeprazole (PRILOSEC) 20 MG capsule Take 1 capsule (20 mg total) by mouth daily. 08/24/20   Inez Catalina, MD  oxyCODONE-acetaminophen (PERCOCET) 10-325 MG tablet Take 1 tablet by mouth every 6 (six) hours as needed for pain (chronic arthritis pain, multiple sites, diag code M16.11, M25.50). 07/21/21 08/20/21  Inez Catalina, MD  potassium chloride SA (KLOR-CON M) 20 MEQ tablet Take 20 mEq by mouth daily. Patient not taking: Reported  on 02/05/2022 05/30/21   [provider]  predniSONE (DELTASONE) 20 MG tablet Take 2 tablets (40 mg total) by mouth daily. 02/05/22   Benjiman Core, MD  rivaroxaban (XARELTO) 20 MG TABS tablet TAKE ONE TABLET BY MOUTH DAILY WITH SUPPER 07/21/21   Inez Catalina, MD  sertraline (ZOLOFT) 50 MG tablet TAKE 1 TABLET(50 MG) BY MOUTH AT BEDTIME Patient taking differently: Take 50 mg by mouth at bedtime. 08/22/21   Inez Catalina, MD  tiZANidine (ZANAFLEX) 4 MG tablet TAKE 1 TABLET(4 MG) BY MOUTH EVERY 8 HOURS AS NEEDED FOR MUSCLE SPASMS  06/27/22   Kathryne Hitch, MD  tolterodine (DETROL LA) 2 MG 24 hr capsule TAKE 1 CAPSULE(2 MG) BY MOUTH DAILY Patient taking differently: Take 2 mg by mouth daily. 08/22/21   Inez Catalina, MD  torsemide (DEMADEX) 20 MG tablet Take 20 mg by mouth 2 (two) times daily. Patient not taking: Reported on 02/05/2022 05/31/21   [provider]  triamcinolone cream (KENALOG) 0.1 % Apply 1 application topically 2 (two) times daily. To front of legs. 08/24/20   Inez Catalina, MD      Allergies    Ace inhibitors, Other, Regadenoson, Shrimp [shellfish allergy], Peanut oil, Hydrocodone-acetaminophen, Hydromorphone hcl, Oxycodone-acetaminophen, Propoxyphene n-acetaminophen, and Vicodin [hydrocodone-acetaminophen]    Review of Systems   Review of Systems  Respiratory:  Positive for shortness of breath.   Ten systems reviewed and are negative for acute change, except as noted in the HPI.    Physical Exam Updated Vital Signs BP (!) 149/83   Pulse 63   Temp 98.1 F (36.7 C) (Oral)   Resp 17   SpO2 97%   Physical Exam Vitals and nursing note reviewed.  Constitutional:      General: She is not in acute distress.    Appearance: She is well-developed. She is not diaphoretic.     Comments: Agitated, paranoid. Tangential speech.  HENT:     Head: Normocephalic and atraumatic.  Eyes:     General: No scleral icterus.    Conjunctiva/sclera: Conjunctivae normal.  Cardiovascular:     Rate and Rhythm: Normal rate and regular rhythm.     Pulses: Normal pulses.  Pulmonary:     Effort: Pulmonary effort is normal. No respiratory distress.     Breath sounds: No stridor. No rhonchi or rales.     Comments: Mild adventitious sounds without overt wheezing. No rales or rhonchi. Speaking in full sentences.  Musculoskeletal:        General: Normal range of motion.     Cervical back: Normal range of motion.     Comments: No BLE pitting edema.  Skin:    General: Skin is warm and dry.      Coloration: Skin is not pale.     Findings: No erythema or rash.  Neurological:     Mental Status: She is alert and oriented to person, place, and time.     Coordination: Coordination normal.  Psychiatric:        Behavior: Behavior is agitated.     ED Results / Procedures / Treatments   Labs (all labs ordered are listed, but only abnormal results are displayed) Labs Reviewed  ETHANOL - Abnormal; Notable for the following components:      Result Value   Alcohol, Ethyl (B) 125 (*)    All other components within normal limits  COMPREHENSIVE METABOLIC PANEL - Abnormal; Notable for the following components:   Sodium 134 (*)  Glucose, Bld 116 (*)    Creatinine, Ser 1.49 (*)    GFR, Estimated 38 (*)    All other components within normal limits  RAPID URINE DRUG SCREEN, HOSP PERFORMED  CBC WITH DIFFERENTIAL/PLATELET  BRAIN NATRIURETIC PEPTIDE  TROPONIN I (HIGH SENSITIVITY)  TROPONIN I (HIGH SENSITIVITY)    EKG EKG Interpretation  Date/Time:  Thursday Jun 28 2022 22:22:06 EDT Ventricular Rate:  73 PR Interval:  196 QRS Duration: 88 QT Interval:  411 QTC Calculation: 453 R Axis:   39 Text Interpretation: Sinus rhythm since last tracing no significant change Confirmed by Rolan Bucco [phone]) on 06/29/2022 9:45:12 AM  Radiology DG Chest 2 View  Result Date: 06/28/2022 CLINICAL DATA:  Dyspnea EXAM: CHEST - 2 VIEW COMPARISON:  11/03/2019 FINDINGS: Lungs are clear. No pneumothorax or pleural effusion. Stable cardiomegaly. Pulmonary vascularity is normal. No acute bone abnormality. IMPRESSION: 1. No active cardiopulmonary disease. Stable cardiomegaly. Electronically Signed   By: Helyn Numbers M.D.   On: 06/28/2022 23:14    Procedures Procedures    Medications Ordered in ED Medications  albuterol (PROVENTIL) (2.5 MG/3ML) 0.083% nebulizer solution 5 mg (5 mg Nebulization Given 06/28/22 2316)  ipratropium (ATROVENT) nebulizer solution 0.5 mg (0.5 mg Nebulization Given 06/28/22 2316)   LORazepam (ATIVAN) tablet 1 mg (1 mg Oral Given 06/28/22 2314)  tiZANidine (ZANAFLEX) tablet 4 mg (4 mg Oral Given 06/28/22 2321)  traMADol (ULTRAM) tablet 50 mg (50 mg Oral Given 06/29/22 0121)  albuterol (VENTOLIN HFA) 108 (90 Base) MCG/ACT inhaler 2 puff (2 puffs Inhalation Given 06/29/22 0444)  acetaminophen (TYLENOL) tablet 650 mg (650 mg Oral Given 06/29/22 0444)    ED Course/ Medical Decision Making/ A&P Clinical Course as of 06/29/22 1530  Thu Jun 28, 2022  2316 Patient c/o RLE pain, per RN. Chart review notes hx of chronic R hip pain and lumbar radiculopathy. Prescribed Tinizidine for this; will provide dose of chronic prescription. [KH]  Fri Jun 29, 2022  7829 Patient ambulated with oxygen sats staying at or above 90% on room air [KH]  0315 Patient continues to complain of her chronic right lower extremity pain.  States that her shortness of breath has improved.  She denies any shortness of breath at present.  Sats 97% on room air while in the exam room bed.  Plan for delta troponin level.  If reassuring, anticipate discharge with instruction for close primary care follow-up. [KH]    Clinical Course User Index [KH] Antony Madura, PA-C                             Medical Decision Making Amount and/or Complexity of Data Reviewed Labs: ordered. Radiology: ordered.  Risk OTC drugs. Prescription drug management.   This patient presents to the ED for concern of SOB, this involves an extensive number of treatment options, and is a complaint that carries with it a high risk of complications and morbidity.  The differential diagnosis includes asthma exacerbation vs anxiety vs PNA vs pleural effusion vs PTX vs CHF exacerbation vs PE vs ACS   Co morbidities that complicate the patient evaluation  Obesity Asthma  CAD CHF VTE   Additional history obtained:  Additional history obtained from EMS External records from outside source obtained and reviewed including MR lumbar spine from  March 2024 showing multilevel spondylosis and L5/S1 disc protrusion. Hx of chronic low back pain and lower extremity radiculopathy.   Lab Tests:  I Ordered, and personally  interpreted labs.  The pertinent results include:  WBC normal, Troponin flat (13>14), BNP normal, creatinine 1.49 (chronic elevation, stable). Ethanol 125. UDS negative.   Imaging Studies ordered:  I ordered imaging studies including CXR  I independently visualized and interpreted imaging which showed no acute cardiopulmonary abnormality I agree with the radiologist interpretation   Cardiac Monitoring:  The patient was maintained on a cardiac monitor.  I personally viewed and interpreted the cardiac monitored which showed an underlying rhythm of: NSR   Medicines ordered and prescription drug management:  I ordered medication including Duoneb for SOB as well as Tylenol, Zanaflex, Tramadol for chronic pain  Reevaluation of the patient after these medicines showed that the patient improved I have reviewed the patients home medicines and have made adjustments as needed   Test Considered:  ABG   Problem List / ED Course:  Patient presenting for SOB, notes hx of asthma states symptoms feel similar. No overt wheezing on arrival. No signs of respiratory distress. Place on supplemental O2 in the field despite no documented hypoxia. Quickly titrated to room air after arrival without recurrent hypoxemia. Able to ambulate in the department with good O2 saturations. Given Duoneb with some improvement in SOB. Question anxiety component, possibly compounded by ETOH use. CXR reassuring without evidence of pleural effusion, PTX, PNA. Work up negative for CHF exacerbation and does not clinically appear fluid overloaded. Potassium hemolyzed, but no peaked T waves on EKG or QTC changes. Low suspicion for this to be contributing to symptoms. PE considered, but on chronic anticoagulation.   Reevaluation:  After the  interventions noted above, I reevaluated the patient and found that they have :improved   Social Determinants of Health:  Ambulates with assist device   Dispostion:  After consideration of the diagnostic results and the patients response to treatment, I feel that the patent would benefit from outpatient PCP follow up. Given inhaler to use PRN as patient's prior one was expired. Return precautions discussed and provided. Patient discharged in stable condition with no unaddressed concerns.          Final Clinical Impression(s) / ED Diagnoses Final diagnoses:  Shortness of breath    Rx / DC Orders ED Discharge Orders     None         Antony Madura, PA-C 06/29/22 1540    Horton, Mayer Masker, MD 06/30/22 [phone]

## 2022-06-29 ENCOUNTER — Ambulatory Visit (HOSPITAL_BASED_OUTPATIENT_CLINIC_OR_DEPARTMENT_OTHER): Payer: 59 | Admitting: Internal Medicine

## 2022-06-29 DIAGNOSIS — R0602 Shortness of breath: Secondary | ICD-10-CM | POA: Diagnosis not present

## 2022-06-29 LAB — COMPREHENSIVE METABOLIC PANEL
Albumin: 3.9 g/dL (ref 3.5–5.0)
Anion gap: 11 (ref 5–15)
BUN: 19 mg/dL (ref 8–23)
CO2: 22 mmol/L (ref 22–32)
Calcium: 9.1 mg/dL (ref 8.9–10.3)
Chloride: 101 mmol/L (ref 98–111)
Creatinine, Ser: 1.49 mg/dL — ABNORMAL HIGH (ref 0.44–1.00)
GFR, Estimated: 38 mL/min — ABNORMAL LOW (ref >60)
Glucose, Bld: 116 mg/dL — ABNORMAL HIGH (ref 70–99)
Sodium: 134 mmol/L — ABNORMAL LOW (ref 135–145)

## 2022-06-29 LAB — BRAIN NATRIURETIC PEPTIDE: B Natriuretic Peptide: 31.4 pg/mL (ref 0.0–100.0)

## 2022-06-29 LAB — TROPONIN I (HIGH SENSITIVITY)
Troponin I (High Sensitivity): 13 ng/L (ref <18)
Troponin I (High Sensitivity): 14 ng/L (ref <18)

## 2022-06-29 MED ORDER — TRAMADOL HCL 50 MG PO TABS
50.0000 mg | ORAL_TABLET | Freq: Once | ORAL | Status: AC
  Administered 2022-06-29: 50 mg via ORAL
  Filled 2022-06-29: qty 1

## 2022-06-29 MED ORDER — ACETAMINOPHEN 325 MG PO TABS
650.0000 mg | ORAL_TABLET | Freq: Once | ORAL | Status: AC
  Administered 2022-06-29: 650 mg via ORAL
  Filled 2022-06-29: qty 2

## 2022-06-29 MED ORDER — ALBUTEROL SULFATE HFA 108 (90 BASE) MCG/ACT IN AERS
2.0000 | INHALATION_SPRAY | Freq: Once | RESPIRATORY_TRACT | Status: AC
  Administered 2022-06-29: 2 via RESPIRATORY_TRACT
  Filled 2022-06-29: qty 6.7

## 2022-06-29 NOTE — Discharge Instructions (Addendum)
Your evaluation in the ED this evening was reassuring.  We recommend follow-up with your primary care doctor.  You may use 2 puffs of an albuterol inhaler every 4-6 hours as needed for persistent wheezing or shortness of breath.  Return to the ED for new or concerning symptoms.

## 2022-06-29 NOTE — ED Notes (Signed)
Pt continues to c/o RLE. Pt denies this being new or worsening pain. Pt states "this is the pain I have at home and it usually goes away after I take meds". This RN attempted to reposition and reassess the RLE but pt refused and started yelling.

## 2022-07-24 ENCOUNTER — Encounter: Payer: 59 | Admitting: Internal Medicine

## 2022-07-26 ENCOUNTER — Other Ambulatory Visit: Payer: Self-pay | Admitting: Orthopaedic Surgery

## 2022-07-26 ENCOUNTER — Telehealth: Payer: Self-pay | Admitting: Orthopaedic Surgery

## 2022-07-26 MED ORDER — TIZANIDINE HCL 4 MG PO TABS: 4.0000 mg | ORAL_TABLET | Freq: Three times a day (TID) | ORAL | 0 refills | Status: DC | PRN

## 2022-07-26 NOTE — Telephone Encounter (Signed)
Patient asking for a refill on her muscle relaxer call into [facility] on [address]

## 2022-07-27 ENCOUNTER — Encounter: Payer: Self-pay | Admitting: Internal Medicine

## 2022-07-27 ENCOUNTER — Ambulatory Visit (INDEPENDENT_AMBULATORY_CARE_PROVIDER_SITE_OTHER): Payer: 59

## 2022-07-27 ENCOUNTER — Other Ambulatory Visit: Payer: Self-pay

## 2022-07-27 ENCOUNTER — Ambulatory Visit (INDEPENDENT_AMBULATORY_CARE_PROVIDER_SITE_OTHER): Payer: 59 | Admitting: Internal Medicine

## 2022-07-27 VITALS — BP 195/102 | HR 70 | Temp 98.6°F | Ht 62.0 in | Wt 268.5 lb

## 2022-07-27 DIAGNOSIS — J45909 Unspecified asthma, uncomplicated: Secondary | ICD-10-CM | POA: Diagnosis not present

## 2022-07-27 DIAGNOSIS — M25561 Pain in right knee: Secondary | ICD-10-CM | POA: Insufficient documentation

## 2022-07-27 DIAGNOSIS — I10 Essential (primary) hypertension: Secondary | ICD-10-CM | POA: Diagnosis not present

## 2022-07-27 DIAGNOSIS — M47817 Spondylosis without myelopathy or radiculopathy, lumbosacral region: Secondary | ICD-10-CM | POA: Insufficient documentation

## 2022-07-27 DIAGNOSIS — J4520 Mild intermittent asthma, uncomplicated: Secondary | ICD-10-CM

## 2022-07-27 DIAGNOSIS — R739 Hyperglycemia, unspecified: Secondary | ICD-10-CM | POA: Diagnosis not present

## 2022-07-27 DIAGNOSIS — G8929 Other chronic pain: Secondary | ICD-10-CM | POA: Insufficient documentation

## 2022-07-27 DIAGNOSIS — N1831 Chronic kidney disease, stage 3a: Secondary | ICD-10-CM

## 2022-07-27 DIAGNOSIS — Z0000 Encounter for general adult medical examination without abnormal findings: Secondary | ICD-10-CM

## 2022-07-27 DIAGNOSIS — M2550 Pain in unspecified joint: Secondary | ICD-10-CM | POA: Diagnosis not present

## 2022-07-27 DIAGNOSIS — E785 Hyperlipidemia, unspecified: Secondary | ICD-10-CM | POA: Diagnosis not present

## 2022-07-27 DIAGNOSIS — M5451 Vertebrogenic low back pain: Secondary | ICD-10-CM | POA: Insufficient documentation

## 2022-07-27 DIAGNOSIS — E7800 Pure hypercholesterolemia, unspecified: Secondary | ICD-10-CM

## 2022-07-27 DIAGNOSIS — M25562 Pain in left knee: Secondary | ICD-10-CM | POA: Insufficient documentation

## 2022-07-27 DIAGNOSIS — M48061 Spinal stenosis, lumbar region without neurogenic claudication: Secondary | ICD-10-CM | POA: Insufficient documentation

## 2022-07-27 DIAGNOSIS — I890 Lymphedema, not elsewhere classified: Secondary | ICD-10-CM | POA: Insufficient documentation

## 2022-07-27 LAB — POCT GLYCOSYLATED HEMOGLOBIN (HGB A1C): Hemoglobin A1C: 5.6 % (ref 4.0–5.6)

## 2022-07-27 LAB — GLUCOSE, CAPILLARY: Glucose-Capillary: 104 mg/dL — ABNORMAL HIGH (ref 70–99)

## 2022-07-27 MED ORDER — BUDESONIDE-FORMOTEROL FUMARATE 160-4.5 MCG/ACT IN AERO
2.0000 | INHALATION_SPRAY | Freq: Two times a day (BID) | RESPIRATORY_TRACT | 12 refills | Status: AC | PRN
Start: 2022-07-27 — End: ?

## 2022-07-27 MED ORDER — GABAPENTIN 300 MG PO CAPS: 600.0000 mg | ORAL_CAPSULE | Freq: Two times a day (BID) | ORAL | 3 refills | Status: DC

## 2022-07-27 MED ORDER — OXYCODONE-ACETAMINOPHEN 10-325 MG PO TABS
1.0000 | ORAL_TABLET | Freq: Three times a day (TID) | ORAL | 0 refills | Status: DC | PRN
Start: 2022-07-27 — End: 2022-08-27

## 2022-07-27 NOTE — Progress Notes (Signed)
Subjective:   Vanessa Palmer is a 72 y.o. female who presents for Medicare Annual (Subsequent) preventive examination.  Review of Systems    Defer to PCP.        Objective:    Today's Vitals   07/27/22 1136 07/27/22 1137  BP: (!) 199/110 (!) 195/102  Pulse: 76 70  Temp: 98.6 F (37 C)   TempSrc: Oral   SpO2: 99%   Weight: 268 lb 8 oz (121.8 kg)   Height: 5\' 2"  (1.575 m)   PainSc:  10-Worst pain ever   Body mass index is 49.11 kg/m.     07/27/2022   11:45 AM 07/27/2022    9:50 AM 06/28/2022   10:15 PM 07/21/2021   10:35 AM 03/31/2021   10:00 AM 03/24/2021   10:53 AM 12/09/2020   10:11 AM  Advanced Directives  Does Patient Have a Medical Advance Directive? No No No No No No No  Would patient like information on creating a medical advance directive? No - Patient declined No - Patient declined  No - Patient declined No - Patient declined No - Patient declined No - Patient declined    Current Medications (verified) Outpatient Encounter Medications as of 07/27/2022  Medication Sig   amLODipine (NORVASC) 5 MG tablet TAKE 1 TABLET(5 MG) BY MOUTH DAILY (Patient taking differently: Take 5 mg by mouth daily.)   Ascorbic Acid (VITAMIN C PO) Take 1 tablet by mouth daily.   aspirin EC 81 MG tablet Take 81 mg by mouth daily. Swallow whole.   atorvastatin (LIPITOR) 40 MG tablet TAKE 1 TABLET(40 MG) BY MOUTH DAILY (Patient taking differently: Take 40 mg by mouth daily.)   budesonide-formoterol (SYMBICORT) 160-4.5 MCG/ACT inhaler Inhale 2 puffs into the lungs 3 times/day as needed-between meals & bedtime.   fluticasone (FLONASE) 50 MCG/ACT nasal spray Place 2 sprays into both nostrils daily as needed for allergies.   gabapentin (NEURONTIN) 300 MG capsule Take 2 capsules (600 mg total) by mouth 2 (two) times daily.   hydrALAZINE (APRESOLINE) 50 MG tablet TAKE 1 TABLET(50 MG) BY MOUTH TWICE DAILY (Patient taking differently: Take 50 mg by mouth in the morning and at bedtime.)   hydrOXYzine  (ATARAX) 25 MG tablet Take 25 mg by mouth every 6 (six) hours as needed for itching.   losartan (COZAAR) 50 MG tablet TAKE 1 TABLET(50 MG) BY MOUTH DAILY   metoprolol succinate (TOPROL-XL) 50 MG 24 hr tablet TAKE 1 TABLET(50 MG) BY MOUTH DAILY (Patient taking differently: Take 50 mg by mouth daily.)   nitroGLYCERIN (NITROSTAT) 0.4 MG SL tablet Place 1 tablet (0.4 mg total) under the tongue every 5 (five) minutes as needed for chest pain.   oxyCODONE-acetaminophen (PERCOCET) 10-325 MG tablet Take 1 tablet by mouth every 8 (eight) hours as needed for pain (chronic arthritis pain, multiple sites, diag code M16.11, M25.50).   rivaroxaban (XARELTO) 20 MG TABS tablet TAKE ONE TABLET BY MOUTH DAILY WITH SUPPER   sertraline (ZOLOFT) 100 MG tablet Take 100 mg by mouth daily.   tiZANidine (ZANAFLEX) 4 MG tablet Take 1 tablet (4 mg total) by mouth 3 (three) times daily as needed for muscle spasms. TAKE 1 TABLET(4 MG) BY MOUTH EVERY 8 HOURS AS NEEDED FOR MUSCLE SPASMS   tolterodine (DETROL LA) 2 MG 24 hr capsule TAKE 1 CAPSULE(2 MG) BY MOUTH DAILY (Patient taking differently: Take 2 mg by mouth daily.)   No facility-administered encounter medications on file as of 07/27/2022.    Allergies (verified) Ace  inhibitors, Other, Regadenoson, Shrimp [shellfish allergy], Peanut oil, Hydrocodone-acetaminophen, Hydromorphone hcl, Oxycodone-acetaminophen, Propoxyphene n-acetaminophen, and Vicodin [hydrocodone-acetaminophen]   History: Past Medical History:  Diagnosis Date   ALCOHOL ABUSE 12/13/2005   Annotation: Sober since 11/06 Qualifier: Diagnosis of  By: Wallace Cullens MD, Natalia Leatherwood     Anemia    Arthritis    "all over" (02/21/2017)   CAD (coronary artery disease)    s/p non-Q wave MI in 06/2001 and 2004, 2005   CHF (congestive heart failure) (HCC)    Chronic kidney disease (CKD), stage III (moderate) (HCC) 09/19/2010   Chronic mid back pain    Depression    DJD (degenerative joint disease)    DVT (deep venous  thrombosis) (HCC)    BLE   DVT of lower extremity, bilateral (HCC) 04/04/2012   On Xarelto     GERD (gastroesophageal reflux disease)    Gout    Hyperlipidemia    Hypertension    INTRINSIC ASTHMA, WITH EXACERBATION 09/27/2009   Qualifier: Diagnosis of  By: Denton Meek MD, Nodira     Migraine    "none anymore" (02/21/2017)   Myocardial infarction (HCC) ?2005   Obesity    OSA (obstructive sleep apnea)    previously used CPAP, has lost it (02/21/2017)   Seizures (HCC)    As a teenager    Past Surgical History:  Procedure Laterality Date   ABDOMINAL HYSTERECTOMY     partial; due to endometriosis   CARDIAC CATHETERIZATION  06/2001, 02/2002, 10/2004    (10-15% proximal stenosis of circumflex, diffuse disease of  OM1 and RCA)   CARDIAC CATHETERIZATION  02/21/2017   COLONOSCOPY     JOINT REPLACEMENT     KNEE ARTHROSCOPY Left    LEFT HEART CATH AND CORONARY ANGIOGRAPHY N/A 02/21/2017   Procedure: LEFT HEART CATH AND CORONARY ANGIOGRAPHY;  Surgeon: Rinaldo Cloud, MD;  Location: [facility];  Service: Cardiovascular;  Laterality: N/A;   TOTAL HIP ARTHROPLASTY Right 11/23/2014   Procedure: RIGHT TOTAL HIP ARTHROPLASTY ANTERIOR APPROACH;  Surgeon: Kathryne Hitch, MD;  Location: [facility] OR;  Service: Orthopedics;  Laterality: Right;   TUBAL LIGATION     Family History  Problem Relation Age of Onset   Mental illness Mother    Heart disease Father    Diabetes Sister    Heart disease Sister    Diabetes Sister    Mental illness Sister    Heart disease Brother    Hypertension Daughter    Heart disease Daughter    Social History   Socioeconomic History   Marital status: Widowed    Spouse name: Not on file   Number of children: 3   Years of education: GED   Highest education level: Not on file  Occupational History   Occupation: retired    Associate Professor: UNEMPLOYED    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   Drug use: No   Sexual activity: Not on file  Other Topics Concern   Not on file  Social History Narrative   Current Social History 02/01/2020        Patient lives alone in a/an apartment which is 1 story.  There are not steps up to the entrance the patient uses.       Patient's method of transportation is via family member and the city bus.      The highest level of education was GED school diploma.      The patient currently disabled.      Identified important Relationships are Granddaughter       Pets : None       Interests / Fun: "I like to Crochet"       Current Stressors: "My best friend passed away in 12-22-2022"       Social Determinants of Health   Financial Resource Strain: Low Risk  (07/27/2022)   Overall Financial Resource Strain (CARDIA)    Difficulty of Paying Living Expenses: Not hard at all  Food Insecurity: No Food Insecurity (07/27/2022)   Hunger Vital Sign    Worried About Running Out of Food in the Last Year: Never true    Ran Out of Food in the Last Year: Never true  Transportation Needs: No Transportation Needs (07/27/2022)   PRAPARE - Administrator, Civil Service (Medical): No    Lack of Transportation (Non-Medical): No  Physical Activity: Inactive (07/27/2022)   Exercise Vital Sign    Days of Exercise per Week: 0 days    Minutes of Exercise per Session: 0 min  Stress: No Stress Concern Present (07/27/2022)   [employer] of Occupational Health - Occupational Stress Questionnaire    Feeling of Stress : Only a little  Social Connections: Moderately Integrated (07/27/2022)   Social Connection and Isolation Panel [NHANES]    Frequency of Communication with Friends and Family: More than three times a week    Frequency of Social  Gatherings with Friends and Family: Once a week    Attends Religious Services: More than 4 times per year    Active Member of [employer] or Organizations: Yes    Attends Banker Meetings: More than 4 times per year    Marital Status: Widowed    Tobacco Counseling Counseling given: Not Answered   Clinical Intake:  Pre-visit preparation completed: Yes  Pain : 0-10 Pain Score: 10-Worst pain ever Pain Type: Acute pain Pain Location: Generalized (HEAD, BACK, KNEES) Pain Orientation: Right, Left Pain Onset: More than a month ago Pain Frequency: Constant Pain Relieving Factors: Tylenol is not helping.  Pain Relieving Factors: Tylenol is not helping.  BMI - recorded: 49.11 Nutritional Status: BMI > 30  Obese Nutritional Risks: None Diabetes: No  How often do you need to have someone help you when you read instructions, pamphlets, or other written materials from your doctor or pharmacy?: 3 - Sometimes What is the last grade level you completed in school?: GED  Diabetic?NO  Interpreter Needed?: No  Information entered by :: Emaad Nanna, CMA 07/27/2022 11:39AM   Activities of Daily Living    07/27/2022   11:46 AM 07/27/2022    9:49 AM  In your present state of health, do you have any difficulty performing the following activities:  Hearing? 0 0  Vision? 0 0  Difficulty concentrating or making decisions? 1 1  Walking or climbing stairs? 1 1  Dressing or bathing? 1 1  Doing errands, shopping? 1 1    Patient Care Team: Inez Catalina, MD as PCP - General (Internal Medicine)  Indicate any recent Medical Services you may have received from other than [facility] providers in the past year (date may be approximate).  Assessment:   This is a routine wellness examination for Vanessa Palmer.  Hearing/Vision screen No results found.  Dietary issues and exercise activities discussed:     Goals Addressed   None   Depression Screen    07/21/2021   10:35 AM 03/31/2021   10:04  AM 03/24/2021   10:50 AM 12/09/2020   10:15 AM 08/24/2020    9:47 AM 08/15/2020    3:47 PM 03/11/2020   11:06 AM  PHQ 2/9 Scores  PHQ - 2 Score   5   1   PHQ- 9 Score   17   7   Exception Documentation Patient refusal Patient refusal  Patient refusal Patient refusal  Patient refusal    Fall Risk    07/27/2022   11:45 AM 07/27/2022    9:49 AM 08/23/2021    2:34 PM 07/21/2021   10:33 AM 04/19/2021    9:21 AM  Fall Risk   Falls in the past year? 1 1 1 1 1   Number falls in past yr: 1 1 1 1 1   Injury with Fall? 1 1 0 0 0  Risk for fall due to : History of fall(s);Impaired balance/gait;Impaired mobility History of fall(s);Impaired balance/gait;Impaired mobility Impaired balance/gait;Impaired mobility History of fall(s);Impaired balance/gait;Impaired mobility Impaired balance/gait;Impaired mobility  Follow up Falls evaluation completed;Falls prevention discussed Falls evaluation completed;Falls prevention discussed Falls evaluation completed Falls evaluation completed;Falls prevention discussed Falls evaluation completed;Falls prevention discussed    FALL RISK PREVENTION PERTAINING TO THE HOME:  Any stairs in or around the home? No  If so, are there any without handrails? No  Home free of loose throw rugs in walkways, pet beds, electrical cords, etc? Yes  Adequate lighting in your home to reduce risk of falls? Yes   ASSISTIVE DEVICES UTILIZED TO PREVENT FALLS:  Life alert? No  Use of a cane, walker or w/c? Yes  Grab bars in the bathroom? Yes  Shower chair or bench in shower? Yes  Elevated toilet seat or a handicapped toilet? No   TIMED UP AND GO:  Was the test performed? No .  Length of time to ambulate 10 feet: N/A sec.   Gait steady and fast with assistive device  Cognitive Function:        Immunizations Immunization History  Administered Date(s) Administered   Fluad Quad(high Dose 65+) 12/09/2020   Influenza,inj,Quad PF,6+ Mos 10/30/2018, 12/11/2019   Moderna  Sars-Covid-2 Vaccination 08/06/2019, 09/17/2019   Tdap 03/24/2021    TDAP status: Up to date  Flu Vaccine status: Up to date  Pneumococcal vaccine status: Due, Education has been provided regarding the importance of this vaccine. Advised may receive this vaccine at local pharmacy or Health Dept. Aware to provide a copy of the vaccination record if obtained from local pharmacy or Health Dept. Verbalized acceptance and understanding.  Covid-19 vaccine status: Information provided on how to obtain vaccines.   Qualifies for Shingles Vaccine? Yes   Zostavax completed No   Shingrix Completed?: No.    Education has been provided regarding the importance of this vaccine. Patient has been advised to call insurance company to determine out of pocket expense if they have not yet received this vaccine. Advised may also receive vaccine at local pharmacy or Health Dept. Verbalized acceptance and understanding.  Screening Tests Health Maintenance  Topic Date Due   Zoster Vaccines- Shingrix (1 of 2) Never done   Pneumonia Vaccine 66+ Years old (1 of 1 - PCV) Never done   Colonoscopy  08/19/2020  COVID-19 Vaccine (3 - 2023-24 season) 10/27/2021   INFLUENZA VACCINE  09/27/2022   Medicare Annual Wellness (AWV)  07/27/2023   MAMMOGRAM  09/28/2023   DTaP/Tdap/Td (2 - Td or Tdap) 03/25/2031   DEXA SCAN  Completed   Hepatitis C Screening  Completed   HPV VACCINES  Aged Out    Health Maintenance  Health Maintenance Due  Topic Date Due   Zoster Vaccines- Shingrix (1 of 2) Never done   Pneumonia Vaccine 16+ Years old (1 of 1 - PCV) Never done   Colonoscopy  08/19/2020   COVID-19 Vaccine (3 - 2023-24 season) 10/27/2021    Colorectal cancer screening: Type of screening: Colonoscopy. Completed 08/20/2010. Repeat every 10 years  Mammogram status: Completed 09/27/2021. Repeat every year  Bone Density status: Completed 05/29/2019. Results reflect: Bone density results: OSTEOPOROSIS. Repeat every 2  years.  Lung Cancer Screening: (Low Dose CT Chest recommended if Age 59-80 years, 30 pack-year currently smoking OR have quit w/in 15years.) does not qualify.   Lung Cancer Screening Referral: Defer to PCP.   Additional Screening:  Hepatitis C Screening: does qualify; Completed 08/11/2010  Vision Screening: Recommended annual ophthalmology exams for early detection of glaucoma and other disorders of the eye. Is the patient up to date with their annual eye exam?  No  Who is the provider or what is the name of the office in which the patient attends annual eye exams? Defer to PCP.  If pt is not established with a provider, would they like to be referred to a provider to establish care? No .   Dental Screening: Recommended annual dental exams for proper oral hygiene  Community Resource Referral / Chronic Care Management: CRR required this visit?  No   CCM required this visit?  No      Plan:     I have personally reviewed and noted the following in the patient's chart:   Medical and social history Use of alcohol, tobacco or illicit drugs  Current medications and supplements including opioid prescriptions. Patient is currently taking opioid prescriptions. Information provided to patient regarding non-opioid alternatives. Patient advised to discuss non-opioid treatment plan with their provider. Functional ability and status Nutritional status Physical activity Advanced directives List of other physicians Hospitalizations, surgeries, and ER visits in previous 12 months Vitals Screenings to include cognitive, depression, and falls Referrals and appointments  In addition, I have reviewed and discussed with patient certain preventive protocols, quality metrics, and best practice recommendations. A written personalized care plan for preventive services as well as general preventive health recommendations were provided to patient.     Holley Kocurek, CMA   07/27/2022   Nurse Notes: Face  to Face.  Ms. Ricker , Thank you for taking time to come for your Medicare Wellness Visit. I appreciate your ongoing commitment to your health goals. Please review the following plan we discussed and let me know if I can assist you in the future.   These are the goals we discussed:  Goals       Blood Pressure < 140/90      CARE PLAN ENTRY (see longtitudinal plan of care for additional care plan information)  Objective:  Last practice recorded BP readings:  BP Readings from Last 3 Encounters:  08/12/19 115/69  08/10/19 (!) 178/101  07/23/19 (!) 142/80   Most recent eGFR/CrCl: No results found for: EGFR  No components found for: CRCL  Current Barriers:  Knowledge Deficits related to basic understanding of hypertension diagnosis Knowledge deficit  related to self care management of hypertension Knowledge deficit related to dangers of uncontrolled hypertension Literacy barriers Cognitive Deficits  Case Manager Clinical Goal(s):  Over the next 90 days, patient will verbalize understanding of plan for hypertension management Over the next 90 days, patient will attend all scheduled medical appointments: PCP and eye exam Over the next 90 days, patient will demonstrate improved adherence to prescribed treatment plan for hypertension as evidenced by taking all medications as prescribed, monitoring and recording blood pressure as directed, adhering to low sodium/DASH diet Over the next 90 days, patient will demonstrate improved health management independence as evidenced by checking blood pressure as directed and notifying PCP if SBP>180 or DBP > 110, taking all medications as prescribe, and adhering to a low sodium diet as discussed. Over the next 90 days, patient will verbalize basic understanding of hypertension disease process and self health management plan as evidenced by bp<140/90.  Interventions:  Evaluation of current treatment plan related to hypertension self management and  patient's adherence to plan as established by provider. Discussed plans with patient for ongoing care management follow up and provided patient with direct contact information for care management team Reviewed scheduled/upcoming provider appointments including:  Provided education regarding s/s of stroke and stroke prevention Provided education regarding s/s of heart attack  Patient Self Care Activities:  Self administers medications as prescribed Attends all scheduled provider appointments Monitors BP and records as discussed Adheres to a low sodium diet/DASH diet Increase physical activity as tolerated  Initial goal documentation        Increase physical activity (pt-stated)      Increase leg strength; "legs give out on me"      Weight < 230 lb (104.3 kg)        This is a list of the screening recommended for you and due dates:  Health Maintenance  Topic Date Due   Zoster (Shingles) Vaccine (1 of 2) Never done   Pneumonia Vaccine (1 of 1 - PCV) Never done   Colon Cancer Screening  08/19/2020   COVID-19 Vaccine (3 - 2023-24 season) 10/27/2021   Flu Shot  09/27/2022   Medicare Annual Wellness Visit  07/27/2023   Mammogram  09/28/2023   DTaP/Tdap/Td vaccine (2 - Td or Tdap) 03/25/2031   DEXA scan (bone density measurement)  Completed   Hepatitis C Screening  Completed   HPV Vaccine  Aged Out

## 2022-07-27 NOTE — Progress Notes (Unsigned)
Established Patient Office Visit  Subjective   Patient ID: Vanessa Palmer, female    DOB: [birth year]  Age: 72 y.o. MRN: [id number]  Chief Complaint  Patient presents with   Re-establish patient    C/o pain - knees, back, head.    Follow-up    ED on 5/2 - pt states for asthma attack.    Ms. Levie is a previous patient of mine. She comes to re-establish care.  Her main concern today was pain.  She has chronic pain, has seen chronic pain management and has OA of multiple joints and also spondylosis of the spine.  She is not interested in injectable or surgical therapy.  I reviewed her previous pain specialist notes.  Her daughter is with her today and notes that she was very well controlled on previous medication of percocet 10/325 and now that they have decreased her to 5/325 she is much worse.  She is taking gabapentin 300mg  once to two times a day and does not notice a difference.  We spent ~ 10 minutes discussing the nature of her pain and wraparound therapy for pain including gabapentin.  I reviewed her PDMP and see that she had a last Rx of oxcodone-tylenol in February of this year.    She notes a recent asthma attack.  She was given her albuterol inhaler at that time and has not needed it.  She does not have flovent.  We discussed recent change to recommendations of using PRN symbicort for symptoms and I have filled this for her.    She is due for a refill of her epi pen which I will place.      Patient Active Problem List   Diagnosis Date Noted   Lymphedema 07/27/2022   Pain, joint, knee, right 07/27/2022   Pain, joint, knee, left 07/27/2022   Spinal stenosis, lumbar region without neurogenic claudication 07/27/2022   Spondylosis without myelopathy or radiculopathy, lumbosacral region 07/27/2022   Other chronic pain 07/27/2022   Vertebrogenic low back pain 07/27/2022   Anxiety 10/21/2018   Shellfish allergy 03/19/2018   Unilateral primary osteoarthritis, left knee  04/29/2017   Unilateral primary osteoarthritis, right knee 04/29/2017   Chronic stable angina 02/21/2017   LVH (left ventricular hypertrophy) 08/15/2016   Vitamin D deficiency 05/04/2016   Status post total replacement of right hip 11/23/2014   Osteoarthritis of right hip 10/13/2014   Nocturnal leg cramps 01/06/2014   Bilateral lower extremity edema 06/02/2013   History of DVT (deep vein thrombosis), multiple, lifelong anticoagulation 04/04/2012   Routine health maintenance 01/14/2012   GERD (gastroesophageal reflux disease) 01/09/2012   Urinary incontinence 12/31/2011   Chronic kidney disease (CKD), stage III (moderate) (HCC) 09/19/2010   Asthma, chronic 09/27/2009   Elevated alkaline phosphatase level 08/01/2009   Pain in joint, multiple sites 12/21/2008   MAJOR DPRSV DISORDER RECURRENT EPISODE MODERATE 09/08/2008   Gout 07/05/2008   Hyperlipidemia 05/07/2006   Coronary atherosclerosis 02/05/2006   Morbid obesity (HCC) 12/13/2005   History of alcohol abuse 12/13/2005   SLEEP APNEA, OBSTRUCTIVE 12/13/2005   Essential hypertension 12/13/2005    Review of Systems  Constitutional:  Negative for chills, fever, malaise/fatigue and weight loss.  HENT:  Negative for hearing loss and tinnitus.   Respiratory:  Negative for cough and shortness of breath.   Cardiovascular:  Negative for chest pain and palpitations.  Musculoskeletal:  Positive for back pain, joint pain and neck pain. Negative for falls.  Neurological:  Positive for headaches. Negative for  dizziness and focal weakness.  Psychiatric/Behavioral:  The patient is nervous/anxious.       Objective:     BP (!) 199/110 (BP Location: Right Arm, Patient Position: Sitting, Cuff Size: Normal)   Pulse 76   Temp 98.6 F (37 C) (Oral)   Ht 5\' 2"  (1.575 m)   Wt 268 lb 8 oz (121.8 kg)   SpO2 99% Comment: Ra  BMI 49.11 kg/m  BP Readings from Last 3 Encounters:  07/27/22 (!) 199/110  06/29/22 (!) 149/83  02/05/22 129/89   Wt  Readings from Last 3 Encounters:  07/27/22 268 lb 8 oz (121.8 kg)  02/05/22 268 lb 15.4 oz (122 kg)  07/21/21 274 lb 14.4 oz (124.7 kg)      Physical Exam Vitals and nursing note reviewed.  Constitutional:      General: She is not in acute distress.    Appearance: She is obese.  HENT:     Head: Normocephalic and atraumatic.  Cardiovascular:     Rate and Rhythm: Normal rate.  Pulmonary:     Effort: Pulmonary effort is normal. No respiratory distress.  Musculoskeletal:        General: No swelling or tenderness.  Neurological:     General: No focal deficit present.     Mental Status: She is alert. Mental status is at baseline.  Psychiatric:        Mood and Affect: Mood normal.        Behavior: Behavior normal.      No results found for any visits on 07/27/22.  {Labs (Optional):23779}  The ASCVD Risk score (Arnett DK, et al., 2019) failed to calculate for the following reasons:   The valid total cholesterol range is 130 to 320 mg/dL    Assessment & Plan:   Problem List Items Addressed This Visit       Unprioritized   Hyperlipidemia - Primary (Chronic)   Essential hypertension (Chronic)   Relevant Orders   BMP8+Anion Gap   Pain in joint, multiple sites (Chronic)   Relevant Medications   oxyCODONE-acetaminophen (PERCOCET) 10-325 MG tablet   Other Visit Diagnoses     Hyperglycemia       Relevant Orders   POC Hbg A1C       Return in about 4 weeks (around 08/24/2022).    [name], MD

## 2022-07-27 NOTE — Patient Instructions (Signed)
Ms. Mcgroarty - -  Please INCREASE your gabapentin to 600mg  (2 capsules) twice a day.  Please start taking your Percocet 10/325mg  up to 3 times a day for pain  Please keep taking your blood pressure medications as you have been.   Please start using symbicort up to 3 times a day for asthma symptoms.   THank you!  Come back in 1 month.

## 2022-07-27 NOTE — Patient Instructions (Signed)

## 2022-07-28 LAB — BMP8+ANION GAP
Anion Gap: 15 mmol/L (ref 10.0–18.0)
BUN/Creatinine Ratio: 21 (ref 12–28)
BUN: 28 mg/dL — ABNORMAL HIGH (ref 8–27)
CO2: 22 mmol/L (ref 20–29)
Calcium: 9.3 mg/dL (ref 8.7–10.3)
Chloride: 103 mmol/L (ref 96–106)
Creatinine, Ser: 1.33 mg/dL — ABNORMAL HIGH (ref 0.57–1.00)
Glucose: 116 mg/dL — ABNORMAL HIGH (ref 70–99)
Potassium: 4.4 mmol/L (ref 3.5–5.2)
Sodium: 140 mmol/L (ref 134–144)
eGFR: 43 mL/min/{1.73_m2} — ABNORMAL LOW (ref >59)

## 2022-07-29 NOTE — Assessment & Plan Note (Signed)
She has not had any of her controlling medications, ICS, since being at her new clinic.  She was given an albuterol inhaler.  Given her recent exacerbation likely related to no controlling medications, will start Symbicort BID and PRN.  Follow up at next visit.

## 2022-07-29 NOTE — Assessment & Plan Note (Signed)
BP is very high today.  Her BP was previously controlled on 4 drug regimen which she noted she took today.  However, she has been decreased and then stopped off of her percocet for chronic pain, which is likely playing a part.  I did not make any changes today.  I will see her back quickly to reassess blood pressure and make appropriate changes at that time.   BMET today for renal function.

## 2022-07-29 NOTE — Assessment & Plan Note (Signed)
Pain was the main issue we discussed today.  Vanessa Palmer has been with a new provider for the past year and was sent to pain management.  She was weaned off of her percocet rather abruptly and comes in complaining of 20/10 pain.  She is on a low dose of gabapentin which she does not feel helps much, tylenol and she was prescribed lidocaine patches which she did not get from the pharmacy for unclear reasons.  She is also on tizanidine.  She will occasionally take a percocet of her daughters to help with pain.   When I was last caring for this patient, her pain was manageable and moderately controlled on 4 X a day percocet 10/325.  Her last Rx was for 90 tab of percocet 5/325 in February of this year.  It does not appear she had any withdrawal symptoms.    She is adamant about not wanting injectable therapy or surgery.   We discussed gabapentin today, and how she is on a relatively low dose.   Plan Increase gabapentin to 600mg  BID Restart Percocet 10/325, TID PRN severe pain with 90 tabs Reassess in 4 weeks.  If improved on this dosing, will have her sign a new medication contract UDS at next visit.

## 2022-07-29 NOTE — Assessment & Plan Note (Signed)
BMET today for renal function

## 2022-08-03 ENCOUNTER — Ambulatory Visit (INDEPENDENT_AMBULATORY_CARE_PROVIDER_SITE_OTHER): Payer: 59 | Admitting: Student

## 2022-08-03 ENCOUNTER — Telehealth: Payer: Self-pay | Admitting: *Deleted

## 2022-08-03 ENCOUNTER — Encounter: Payer: Self-pay | Admitting: Student

## 2022-08-03 DIAGNOSIS — I10 Essential (primary) hypertension: Secondary | ICD-10-CM

## 2022-08-03 NOTE — Patient Instructions (Signed)
Thank you, Vanessa Palmer for allowing [name] to provide your care today. Today we discussed your elevated blood pressure.  I have increased your amlodipine to 10 mg daily as discussed.  Please follow-up with [name] next week so we can check your blood pressure again.  I have ordered the following medication/changed the following medications:  Increase amlodipine from 5 to 10 mg daily  My Chart Access: [email or web address]  Please follow-up on 6/11 at 3:15 PM  Please make sure to arrive 15 minutes prior to your next appointment. If you arrive late, you may be asked to reschedule.    We look forward to seeing you next time. Please call our clinic at [phone] if you have any questions or concerns. The best time to call is Monday-Friday from 9am-4pm, but there is someone available 24/7. If after hours or the weekend, call the main hospital number and ask for the Internal Medicine Resident On-Call. If you need medication refills, please notify your pharmacy one week in advance and they will send [name] a request.   Thank you for letting us take part in your care. Wishing you the best!  Steffanie Rainwater, MD 08/03/2022, 4:17 PM IM Resident, PGY-3 Duwayne Heck 41:10

## 2022-08-03 NOTE — Progress Notes (Signed)
   CC: Elevated BP  This is a telephone encounter between Vanessa Palmer and Vanessa Palmer on 08/03/2022 for elevated blood pressure. The visit was conducted with the patient located at home and Vanessa Palmer at [facility]. The patient's identity was confirmed using their DOB and current address. The patient has consented to being evaluated through a telephone encounter and understands the associated risks (an examination cannot be done and the patient may need to come in for an appointment) / benefits (allows the patient to remain at home, decreasing exposure to coronavirus). I personally spent 20 minutes on medical discussion.   HPI:  [name] is a 72 y.o. with PMH as below.   Please see A&P for assessment of the patient's acute and chronic medical conditions.   Past Medical History:  Diagnosis Date   ALCOHOL ABUSE 12/13/2005   Annotation: Sober since 11/06 Qualifier: Diagnosis of  By: Vanessa Cullens MD, Vanessa Palmer     Anemia    Arthritis    "all over" (02/21/2017)   CAD (coronary artery disease)    s/p non-Q wave MI in 06/2001 and 2004, 2005   CHF (congestive heart failure) (HCC)    Chronic kidney disease (CKD), stage III (moderate) (HCC) 09/19/2010   Chronic mid back pain    Depression    DJD (degenerative joint disease)    DVT (deep venous thrombosis) (HCC)    BLE   DVT of lower extremity, bilateral (HCC) 04/04/2012   On Xarelto     GERD (gastroesophageal reflux disease)    Gout    Hyperlipidemia    Hypertension    INTRINSIC ASTHMA, WITH EXACERBATION 09/27/2009   Qualifier: Diagnosis of  By: Vanessa Meek MD, Vanessa Palmer     Migraine    "none anymore" (02/21/2017)   Myocardial infarction (HCC) ?2005   Obesity    OSA (obstructive sleep apnea)    previously used CPAP, has lost it (02/21/2017)   Seizures (HCC)    As a teenager    Review of Systems: Patient endorsed mild headache and chronic lower extremity swelling but denies any blurry vision, dizziness, chest pain palpitations or  shortness of breath.  Assessment & Plan:   See Encounters Tab for problem based charting.  Patient discussed with Dr. Anthoney Harada, MD, MPH

## 2022-08-03 NOTE — Assessment & Plan Note (Addendum)
Patient with a history of resistant hypertension and CKD stage IIIb evaluated today via televisit for elevated blood pressure. Patient's BP was previously well-controlled on 4 drug regimen however during her last office visit with her PCP, BP was significantly elevated to 190/100s. This was thought to be partially due to her increased pain so her Percocet was increased. Yesterday evening, and NP from [facility] clinic who visits patient weekly noticed her BP was 205/100. EMS was called however patient refused to go to the ED due to the long wait times. Today, repeat BP shows her blood pressure is still elevated but improved to the 160-170s/90-100s. Besides a mild headache, patient states she feels well denies any vision changes, chest pain, shortness of breath, palpitations or dizziness. She does have chronic lower extremity swelling thought to be due to venous insufficiency.  She does not check her BP daily at home.  Patient has a history of OSA and was previously on CPAP but on further questioning, she tells me that she lost her CPAP when she moved to a new place. Her last sleep study in 2020 show severe OSA (AHI 53.6/hr) which could explain her worsening blood pressure. Her most recent BMP last week showed improvement in her kidney function with creatinine down to 1.33. I have advised patient to take 2 tablets (10 mg) of her amlodipine and follow-up next week for repeat BP.  Plan: -Increase amlodipine from 5 to 10 mg daily -Continue hydralazine 50 mg twice daily -Continue Toprol-XL 50 mg daily -Follow-up for reevaluation scheduled for 6/11 with Dr. Thomasene Ripple       -Further discuss her OSA and order new CPAP machine       -If BP still not controlled, consider switching losartan to Hyzaar. Patient has tried a diuretic in the past but I am unsure when this was discontinued.

## 2022-08-03 NOTE — Telephone Encounter (Signed)
Patient called in with Primus Bravo, NP from [facility] clinic, stating BP last evening was 205/100's. EMS came to home but patient refused transport to ED as she did not want to wait. Tiffany took her BP earlier today and it was 178/98. Currently it is 169/100. Patient has slight h/a, but denies CP, SHOB. Tiffany states she did full assessment which was negative, including no carotid bruits. Patient is under additional stress as her sister is currently hospitalized. Tiffany states patient is taking:  Amlodipine 5 mg daily Metoprolol 50 mg daily Losartan 50 mg daily Hydralazine 50 BID.  Patient also takes gabapentin and flexeril.  Tiffany would like to know if patient can increase amlodipine to 10 mg daily. Tele appt given today with Resident.

## 2022-08-06 NOTE — Progress Notes (Signed)
Internal Medicine Clinic Attending  Case discussed with Dr. Amponsah  At the time of the visit.  We reviewed the resident's history and exam and pertinent patient test results.  I agree with the assessment, diagnosis, and plan of care documented in the resident's note.  

## 2022-08-07 ENCOUNTER — Other Ambulatory Visit: Payer: Self-pay

## 2022-08-07 ENCOUNTER — Ambulatory Visit (INDEPENDENT_AMBULATORY_CARE_PROVIDER_SITE_OTHER): Payer: 59 | Admitting: Student

## 2022-08-07 ENCOUNTER — Encounter: Payer: Self-pay | Admitting: Student

## 2022-08-07 VITALS — BP 184/86

## 2022-08-07 DIAGNOSIS — R600 Localized edema: Secondary | ICD-10-CM

## 2022-08-07 DIAGNOSIS — I10 Essential (primary) hypertension: Secondary | ICD-10-CM | POA: Diagnosis not present

## 2022-08-07 DIAGNOSIS — N3946 Mixed incontinence: Secondary | ICD-10-CM | POA: Diagnosis not present

## 2022-08-07 MED ORDER — LOSARTAN POTASSIUM-HCTZ 50-12.5 MG PO TABS: 1.0000 | ORAL_TABLET | Freq: Every day | ORAL | 3 refills | Status: DC

## 2022-08-07 NOTE — Progress Notes (Signed)
CC: Hypertension follow up  HPI:  [name] is a 72 y.o. female living with a history stated below and presents today for hypertension follow up. Please see problem based assessment and plan for additional details.  Past Medical History:  Diagnosis Date   ALCOHOL ABUSE 12/13/2005   Annotation: Sober since 11/06 Qualifier: Diagnosis of  By: Wallace Cullens MD, Natalia Leatherwood     Anemia    Arthritis    "all over" (02/21/2017)   CAD (coronary artery disease)    s/p non-Q wave MI in 06/2001 and 2004, 2005   CHF (congestive heart failure) (HCC)    Chronic kidney disease (CKD), stage III (moderate) (HCC) 09/19/2010   Chronic mid back pain    Depression    DJD (degenerative joint disease)    DVT (deep venous thrombosis) (HCC)    BLE   DVT of lower extremity, bilateral (HCC) 04/04/2012   On Xarelto     GERD (gastroesophageal reflux disease)    Gout    Hyperlipidemia    Hypertension    INTRINSIC ASTHMA, WITH EXACERBATION 09/27/2009   Qualifier: Diagnosis of  By: Denton Meek MD, Nodira     Migraine    "none anymore" (02/21/2017)   Myocardial infarction (HCC) ?2005   Obesity    OSA (obstructive sleep apnea)    previously used CPAP, has lost it (02/21/2017)   Seizures (HCC)    As a teenager     Current Outpatient Medications on File Prior to Visit  Medication Sig Dispense Refill   amLODipine (NORVASC) 5 MG tablet TAKE 1 TABLET(5 MG) BY MOUTH DAILY (Patient taking differently: Take 5 mg by mouth daily.) 90 tablet 3   Ascorbic Acid (VITAMIN C PO) Take 1 tablet by mouth daily.     aspirin EC 81 MG tablet Take 81 mg by mouth daily. Swallow whole.     atorvastatin (LIPITOR) 40 MG tablet TAKE 1 TABLET(40 MG) BY MOUTH DAILY (Patient taking differently: Take 40 mg by mouth daily.) 90 tablet 3   budesonide-formoterol (SYMBICORT) 160-4.5 MCG/ACT inhaler Inhale 2 puffs into the lungs 3 times/day as needed-between meals & bedtime. 1 each 12   fluticasone (FLONASE) 50 MCG/ACT nasal spray Place 2 sprays  into both nostrils daily as needed for allergies. 48 g 11   gabapentin (NEURONTIN) 300 MG capsule Take 2 capsules (600 mg total) by mouth 2 (two) times daily. 360 capsule 3   hydrALAZINE (APRESOLINE) 50 MG tablet TAKE 1 TABLET(50 MG) BY MOUTH TWICE DAILY (Patient taking differently: Take 50 mg by mouth in the morning and at bedtime.) 180 tablet 3   hydrOXYzine (ATARAX) 25 MG tablet Take 25 mg by mouth every 6 (six) hours as needed for itching.     metoprolol succinate (TOPROL-XL) 50 MG 24 hr tablet TAKE 1 TABLET(50 MG) BY MOUTH DAILY (Patient taking differently: Take 50 mg by mouth daily.) 90 tablet 3   nitroGLYCERIN (NITROSTAT) 0.4 MG SL tablet Place 1 tablet (0.4 mg total) under the tongue every 5 (five) minutes as needed for chest pain. 30 tablet 1   oxyCODONE-acetaminophen (PERCOCET) 10-325 MG tablet Take 1 tablet by mouth every 8 (eight) hours as needed for pain (chronic arthritis pain, multiple sites, diag code M16.11, M25.50). 90 tablet 0   rivaroxaban (XARELTO) 20 MG TABS tablet TAKE ONE TABLET BY MOUTH DAILY WITH SUPPER 90 tablet 1   sertraline (ZOLOFT) 100 MG tablet Take 100 mg by mouth daily.     tiZANidine (ZANAFLEX) 4 MG tablet Take 1  tablet (4 mg total) by mouth 3 (three) times daily as needed for muscle spasms. TAKE 1 TABLET(4 MG) BY MOUTH EVERY 8 HOURS AS NEEDED FOR MUSCLE SPASMS 40 tablet 0   tolterodine (DETROL LA) 2 MG 24 hr capsule TAKE 1 CAPSULE(2 MG) BY MOUTH DAILY (Patient taking differently: Take 2 mg by mouth daily.) 90 capsule 3   No current facility-administered medications on file prior to visit.    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   Drug use: No   Sexual activity: Not on file  Other Topics Concern   Not on file  Social History Narrative   Current Social History 02/01/2020        Patient lives alone in a/an apartment which is 1 story. There are not steps up to the entrance the patient uses.       Patient's method of transportation is via family member and the city bus.      The highest level of education was GED school diploma.      The patient currently disabled.      Identified important Relationships are Granddaughter       Pets : None       Interests / Fun: "I like to Crochet"       Current Stressors: "My best friend passed away in 01/12/2023"       Social Determinants of Health   Financial Resource Strain: Low Risk  (07/27/2022)   Overall Financial Resource Strain (CARDIA)    Difficulty of Paying Living Expenses: Not hard at all  Food Insecurity: No Food Insecurity (07/27/2022)   Hunger Vital Sign    Worried About Running Out of Food in the Last Year: Never true    Ran Out of Food in the Last Year: Never true  Transportation Needs: No Transportation Needs (07/27/2022)   PRAPARE - Administrator, Civil Service (Medical): No    Lack of Transportation (Non-Medical): No  Physical Activity: Inactive (07/27/2022)   Exercise Vital Sign    Days of Exercise per Week: 0 days    Minutes of Exercise per Session: 0 min  Stress: No Stress Concern Present (07/27/2022)   [employer] of Occupational Health - Occupational  Stress Questionnaire    Feeling of Stress : Only a little  Social Connections: Moderately Integrated (07/27/2022)   Social Connection and Isolation Panel [NHANES]    Frequency of Communication with Friends and Family: More than three times a  week    Frequency of Social Gatherings with Friends and Family: Once a week    Attends Religious Services: More than 4 times per year    Active Member of [employer] or Organizations: Yes    Attends Banker Meetings: More than 4 times per year    Marital Status: Widowed  Intimate Partner Violence: Not At Risk (07/27/2022)   Humiliation, Afraid, Rape, and Kick questionnaire    Fear of Current or Ex-Partner: No    Emotionally Abused: No    Physically Abused: No    Sexually Abused: No    Review of Systems: ROS negative except for what is noted on the assessment and plan.  Vitals:   08/07/22 1528  BP: (!) 184/86    Physical Exam: Constitutional: Elderly female  in no acute distress Cardiovascular: regular rate and rhythm, no m/r/g, +2 edema in lower extremities bilaterally Pulmonary/Chest: normal work of breathing on room air, lungs clear to auscultation bilaterally Abdominal: soft, non-tender, non-distended Skin: Dry, scaly, skin present on lower extremities bilaterally, consistent with chronic venous changes   Assessment & Plan:   Essential hypertension Pt presents as follow up from televisit on August 03, 2022.  Systolic blood pressure in the clinic is around 194, on repeat came down to 184.  Her current regimen is amlodipine 10 mg, metoprolol 50 mg, losartan 50 mg, hydralazine 50 mg twice a day.  She does state that she takes his medications daily, she does have a nurse at home who stops by and she is adamant that she takes them.  She denies any chest pain, shortness of breath, or changes in vision.  Upon further questioning with daughter who was in the room with her, she is unsure if patient is actually compliant with her regimen no matter  how many times she states that she is.  Regardless, believe patient would benefit from addition of a thiazide diuretic as well.  Plan: - Amlodipine 10 mg - Metoprolol 50 mg - Hyzaar 50-12.5mg  - Hydralazine 50 mg twice a day -Will reevaluate in a month for BMP  Bilateral lower extremity edema Patient presents with edematous lower extremities, per daughter these are about 2 times the size of the normal limb.  She does have a history of chronic venous insufficiency, and is not compliant with compression stockings or leg elevation.  Increase edema in legs could be secondary to uncontrolled hypertension.  I reiterated how important it is for patient to wear compression stockings, as well as leg elevation.  I do think with thiazide diuretic (discussed above), she will improve.  Urinary incontinence Patient reports that for quite a while she has been dealing with incontinence.  She says this happens anytime she gets up or coughs. She is able to make it to the bathroom most of the time, so this may be a mixed urge-stress incontinence. Daughter is aware of kegel exercises and will encourage patient to do so. She denies any dysuria, or symptoms of overflow incontinence. She would greatly benefit from incontinence supplies.   Patient discussed with Dr. Lennon Alstrom Tierrah Anastos, M.D. [facility] Internal Medicine, PGY-1 Pager: [phone] Date 08/07/2022 Time 6:12 PM

## 2022-08-07 NOTE — Assessment & Plan Note (Signed)
Patient reports that for quite a while she has been dealing with incontinence.  She says this happens anytime she gets up or coughs. She is able to make it to the bathroom most of the time, so this may be a mixed urge-stress incontinence. Daughter is aware of kegel exercises and will encourage patient to do so. She denies any dysuria, or symptoms of overflow incontinence. She would greatly benefit from incontinence supplies.

## 2022-08-07 NOTE — Patient Instructions (Addendum)
Thank you so much for coming to the clinic today!   I am sending in a medication called hydrochlorothiazide in combination with losartan, one you're already taking. I would like to see you back in a month to make sure your blood pressure is under control. I will also sign the documents for the incontinence supplies, remember to do the kegel exercises.   Also on [employer], you should be able to find good quality compression stockings for cheap. Please remember to wear them, and elevate your legs.   If you have any questions please feel free to the call the clinic at anytime at [phone]. It was a pleasure seeing you!  Best, Dr. Thomasene Ripple

## 2022-08-07 NOTE — Assessment & Plan Note (Addendum)
Patient presents with edematous lower extremities, per daughter these are about 2 times the size of the normal limb.  She does have a history of chronic venous insufficiency, and is not compliant with compression stockings or leg elevation.  Increase edema in legs could be secondary to uncontrolled hypertension.  I reiterated how important it is for patient to wear compression stockings, as well as leg elevation.  I do think with thiazide diuretic (discussed above), she will improve.

## 2022-08-07 NOTE — Assessment & Plan Note (Signed)
Pt presents as follow up from televisit on August 03, 2022.  Systolic blood pressure in the clinic is around 194, on repeat came down to 184.  Her current regimen is amlodipine 10 mg, metoprolol 50 mg, losartan 50 mg, hydralazine 50 mg twice a day.  She does state that she takes his medications daily, she does have a nurse at home who stops by and she is adamant that she takes them.  She denies any chest pain, shortness of breath, or changes in vision.  Upon further questioning with daughter who was in the room with her, she is unsure if patient is actually compliant with her regimen no matter how many times she states that she is.  Regardless, believe patient would benefit from addition of a thiazide diuretic as well.  Plan: - Amlodipine 10 mg - Metoprolol 50 mg - Hyzaar 50-12.5mg  - Hydralazine 50 mg twice a day -Will reevaluate in a month for BMP

## 2022-08-08 NOTE — Addendum Note (Signed)
Addended by: [name] on: 08/08/2022 10:20 AM   Modules accepted: Level of Service

## 2022-08-08 NOTE — Progress Notes (Addendum)
Internal Medicine Attending:   I have seen and evaluated this patient and I have discussed the plan of care with the house staff. I reviewed the resident's note and I agree with the resident's findings and plan as documented in the resident's note. Please see their note for complete details. Thiazide diuretic previously discontinued with remote history of hypokalemia/hyponatremia. Hopefully this will be mitigated by ARB use. F/u in 2-4 weeks for repeat BP and BMP. Leg swelling may also be attributed to increased amlodipine dose from visit 07/31/22. If continuing at f/u appointment and causing discomfort, suggest decreasing back to amlodipine 5 mg to see if this improves.   [name], MD 08/08/2022, 10:16 AM

## 2022-08-21 ENCOUNTER — Other Ambulatory Visit: Payer: Self-pay | Admitting: Internal Medicine

## 2022-08-21 DIAGNOSIS — E785 Hyperlipidemia, unspecified: Secondary | ICD-10-CM

## 2022-08-22 ENCOUNTER — Other Ambulatory Visit: Payer: Self-pay | Admitting: Internal Medicine

## 2022-08-22 DIAGNOSIS — R32 Unspecified urinary incontinence: Secondary | ICD-10-CM

## 2022-08-22 DIAGNOSIS — I10 Essential (primary) hypertension: Secondary | ICD-10-CM

## 2022-08-22 NOTE — Telephone Encounter (Signed)
Next appt scheduled 8/9 with PCP. Also with Dr Benito Mccreedy on 7/9.

## 2022-08-27 ENCOUNTER — Other Ambulatory Visit: Payer: Self-pay

## 2022-08-27 DIAGNOSIS — M2550 Pain in unspecified joint: Secondary | ICD-10-CM

## 2022-08-27 MED ORDER — OXYCODONE-ACETAMINOPHEN 10-325 MG PO TABS
1.0000 | ORAL_TABLET | Freq: Three times a day (TID) | ORAL | 0 refills | Status: DC | PRN
Start: 2022-08-27 — End: 2022-09-26

## 2022-08-27 NOTE — Telephone Encounter (Signed)
Last rx written - 07/27/22. Last OV - 08/07/22. Next OV - 10/05/22 with PCP; 7/9 with Dr Benito Mccreedy. TOX - 08/04/20.

## 2022-09-04 ENCOUNTER — Other Ambulatory Visit: Payer: Self-pay

## 2022-09-04 ENCOUNTER — Ambulatory Visit (INDEPENDENT_AMBULATORY_CARE_PROVIDER_SITE_OTHER): Payer: 59 | Admitting: Student

## 2022-09-04 ENCOUNTER — Encounter: Payer: Self-pay | Admitting: Student

## 2022-09-04 VITALS — BP 127/80 | HR 65 | Temp 98.1°F | Ht 62.0 in | Wt 299.9 lb

## 2022-09-04 DIAGNOSIS — Z79891 Long term (current) use of opiate analgesic: Secondary | ICD-10-CM

## 2022-09-04 DIAGNOSIS — J4520 Mild intermittent asthma, uncomplicated: Secondary | ICD-10-CM

## 2022-09-04 DIAGNOSIS — N1831 Chronic kidney disease, stage 3a: Secondary | ICD-10-CM

## 2022-09-04 DIAGNOSIS — Z86718 Personal history of other venous thrombosis and embolism: Secondary | ICD-10-CM

## 2022-09-04 DIAGNOSIS — Z5181 Encounter for therapeutic drug level monitoring: Secondary | ICD-10-CM

## 2022-09-04 DIAGNOSIS — I129 Hypertensive chronic kidney disease with stage 1 through stage 4 chronic kidney disease, or unspecified chronic kidney disease: Secondary | ICD-10-CM

## 2022-09-04 DIAGNOSIS — I82403 Acute embolism and thrombosis of unspecified deep veins of lower extremity, bilateral: Secondary | ICD-10-CM

## 2022-09-04 DIAGNOSIS — M2550 Pain in unspecified joint: Secondary | ICD-10-CM

## 2022-09-04 DIAGNOSIS — I10 Essential (primary) hypertension: Secondary | ICD-10-CM | POA: Diagnosis not present

## 2022-09-04 DIAGNOSIS — J45909 Unspecified asthma, uncomplicated: Secondary | ICD-10-CM

## 2022-09-04 DIAGNOSIS — N183 Chronic kidney disease, stage 3 unspecified: Secondary | ICD-10-CM

## 2022-09-04 MED ORDER — RIVAROXABAN 20 MG PO TABS
ORAL_TABLET | ORAL | 3 refills | Status: DC
Start: 2022-09-04 — End: 2023-06-11

## 2022-09-04 NOTE — Assessment & Plan Note (Signed)
Symptoms much better controlled after restarting Symbicort last month.

## 2022-09-04 NOTE — Assessment & Plan Note (Signed)
Refilled Xarelto today, looks like last dispense was in December 2023.  She should be on lifelong anticoagulation for her history of DVT.  Encouraged her to bring her medicines to her next visit for a thorough medication reconciliation.

## 2022-09-04 NOTE — Assessment & Plan Note (Signed)
BMP today for serum creatinine.

## 2022-09-04 NOTE — Assessment & Plan Note (Signed)
Great BP control today.  Tolerating the addition of the new blood pressure medicine well.  No orthostatic symptoms or side effects.  Continue her 5 medication regimen. - Amlodipine 5 mg - Hydralazine 50 mg twice daily - Hyzaar 50/12.5 - Metoprolol succinate 50 mg

## 2022-09-04 NOTE — Progress Notes (Signed)
    Subjective:  Vanessa Palmer is a 72 y.o. who presents to clinic for the following:  Doing well overall.  Tolerating the new antihypertensive prescribed at last visit well.  No orthostatic symptoms.  Continues to have chronic pain, however much better controlled on the oxycodone.  Reports good adherence to her medications, including Xarelto which shows pretty inconsistent dispense history.  Objective:   Vitals:   09/04/22 0944  BP: 127/80  Pulse: 65  Temp: 98.1 F (36.7 C)  TempSrc: Oral  SpO2: 100%  Weight: 299 lb 14.4 oz (136 kg)  Height: 5\' 2"  (1.575 m)    Physical Exam Overall well-appearing Heart rate is normal, rhythm is regular, radial pulses are strong, soft systolic murmur, no appreciable JVD while upright Breathing is regular and unlabored, no wheezing or crackles Abdomen is nontender Skin is warm and dry Lower extremities with tense, brawny edema Alert and oriented Pleasant, concordant affect  Assessment & Plan:   Problem List Items Addressed This Visit     Asthma, chronic (Chronic)    Symptoms much better controlled after restarting Symbicort last month.      Chronic kidney disease (CKD), stage III (moderate) (HCC) (Chronic)    BMP today for serum creatinine.      Relevant Orders   BMP8+Anion Gap   Essential hypertension - Primary (Chronic)    Great BP control today.  Tolerating the addition of the new blood pressure medicine well.  No orthostatic symptoms or side effects.  Continue her 5 medication regimen. - Amlodipine 5 mg - Hydralazine 50 mg twice daily - Hyzaar 50/12.5 - Metoprolol succinate 50 mg      Relevant Medications   rivaroxaban (XARELTO) 20 MG TABS tablet   Other Relevant Orders   BMP8+Anion Gap   History of DVT (deep vein thrombosis), multiple, lifelong anticoagulation    Refilled Xarelto today, looks like last dispense was in December 2023.  She should be on lifelong anticoagulation for her history of DVT.  Encouraged her  to bring her medicines to her next visit for a thorough medication reconciliation.      Relevant Medications   rivaroxaban (XARELTO) 20 MG TABS tablet   Pain in joint, multiple sites (Chronic)    Pain much better controlled on Percocet and gabapentin.  UDS today.  Signed pain contract.  No refills needed today.  Follow-up next month for refill.      Other Visit Diagnoses     Deep vein thrombosis (DVT) of both lower extremities, unspecified chronicity, unspecified vein (HCC)       Relevant Medications   rivaroxaban (XARELTO) 20 MG TABS tablet   Encounter for monitoring opioid maintenance therapy       Relevant Orders   ToxAssure Select,+Antidepr,UR       Return in 1 month with Dr. Criselda Peaches for pain and HTN.  Patient discussed with Dr. Durene Romans MD 09/04/2022, 2:19 PM  [phone]

## 2022-09-04 NOTE — Patient Instructions (Addendum)
Bring your medications to your next visit.  This after visit summary is an important review of tests, referrals, and medication changes that were discussed during your visit. If you have questions or concerns, call [phone]. Outside of clinic business hours, call the main hospital at [phone] and ask the operator for the on-call internal medicine resident.   Ernesta Amble MD 09/04/2022, 11:09 AM

## 2022-09-04 NOTE — Assessment & Plan Note (Signed)
Pain much better controlled on Percocet and gabapentin.  UDS today.  Signed pain contract.  No refills needed today.  Follow-up next month for refill.

## 2022-09-05 LAB — BMP8+ANION GAP
Anion Gap: 20 mmol/L — ABNORMAL HIGH (ref 10.0–18.0)
BUN/Creatinine Ratio: 20 (ref 12–28)
BUN: 45 mg/dL — ABNORMAL HIGH (ref 8–27)
CO2: 17 mmol/L — ABNORMAL LOW (ref 20–29)
Calcium: 8.7 mg/dL (ref 8.7–10.3)
Chloride: 96 mmol/L (ref 96–106)
Creatinine, Ser: 2.24 mg/dL — ABNORMAL HIGH (ref 0.57–1.00)
Glucose: 104 mg/dL — ABNORMAL HIGH (ref 70–99)
Potassium: 4.8 mmol/L (ref 3.5–5.2)
Sodium: 133 mmol/L — ABNORMAL LOW (ref 134–144)
eGFR: 23 mL/min/{1.73_m2} — ABNORMAL LOW (ref >59)

## 2022-09-05 LAB — URINALYSIS, ROUTINE W REFLEX MICROSCOPIC

## 2022-09-05 NOTE — Addendum Note (Signed)
Addended by: Marrianne Mood on: 09/05/2022 09:33 AM   Modules accepted: Orders

## 2022-09-06 LAB — URINALYSIS, ROUTINE W REFLEX MICROSCOPIC
Bilirubin, UA: NEGATIVE
Glucose, UA: NEGATIVE
Ketones, UA: NEGATIVE
RBC, UA: NEGATIVE
Specific Gravity, UA: 1.01 (ref 1.005–1.030)
Urobilinogen, Ur: 0.2 mg/dL (ref 0.2–1.0)
pH, UA: 5 (ref 5.0–7.5)

## 2022-09-06 LAB — MICROSCOPIC EXAMINATION
Casts: NONE SEEN /lpf
RBC, Urine: NONE SEEN /hpf (ref 0–2)
WBC, UA: >30 /hpf — AB (ref 0–5)

## 2022-09-06 LAB — TOXASSURE SELECT,+ANTIDEPR,UR

## 2022-09-06 LAB — SPECIMEN STATUS REPORT

## 2022-09-07 ENCOUNTER — Other Ambulatory Visit (INDEPENDENT_AMBULATORY_CARE_PROVIDER_SITE_OTHER): Payer: 59

## 2022-09-07 ENCOUNTER — Other Ambulatory Visit: Payer: Self-pay | Admitting: Student

## 2022-09-07 DIAGNOSIS — N1831 Chronic kidney disease, stage 3a: Secondary | ICD-10-CM | POA: Diagnosis not present

## 2022-09-07 NOTE — Progress Notes (Signed)
Internal Medicine Clinic Attending  Case discussed with the resident at the time of the visit.  We reviewed the resident's history and exam and pertinent patient test results.  I agree with the assessment, diagnosis, and plan of care documented in the resident's note.  

## 2022-09-09 LAB — BMP8+ANION GAP
Anion Gap: 13 mmol/L (ref 10.0–18.0)
BUN/Creatinine Ratio: 23 (ref 12–28)
BUN: 36 mg/dL — ABNORMAL HIGH (ref 8–27)
CO2: 21 mmol/L (ref 20–29)
Calcium: 9.6 mg/dL (ref 8.7–10.3)
Chloride: 103 mmol/L (ref 96–106)
Creatinine, Ser: 1.56 mg/dL — ABNORMAL HIGH (ref 0.57–1.00)
Glucose: 96 mg/dL (ref 70–99)
Potassium: 4.9 mmol/L (ref 3.5–5.2)
Sodium: 137 mmol/L (ref 134–144)
eGFR: 36 mL/min/{1.73_m2} — ABNORMAL LOW (ref >59)

## 2022-09-11 ENCOUNTER — Telehealth: Payer: Self-pay | Admitting: Physician Assistant

## 2022-09-11 MED ORDER — TIZANIDINE HCL 4 MG PO TABS: 4.0000 mg | ORAL_TABLET | Freq: Three times a day (TID) | ORAL | 0 refills | Status: DC | PRN

## 2022-09-11 NOTE — Telephone Encounter (Signed)
 Sent to pharmacy 

## 2022-09-11 NOTE — Telephone Encounter (Signed)
Patient called. Would like a refill on her muscle relaxer. Cb 3 [phone]

## 2022-09-26 ENCOUNTER — Other Ambulatory Visit: Payer: Self-pay

## 2022-09-26 DIAGNOSIS — M2550 Pain in unspecified joint: Secondary | ICD-10-CM

## 2022-09-26 MED ORDER — OXYCODONE-ACETAMINOPHEN 10-325 MG PO TABS
1.0000 | ORAL_TABLET | Freq: Three times a day (TID) | ORAL | 0 refills | Status: DC | PRN
Start: 2022-09-26 — End: 2022-10-25

## 2022-09-28 ENCOUNTER — Telehealth: Payer: Self-pay

## 2022-09-28 NOTE — Patient Outreach (Signed)
  Care Coordination   Initial Visit Note   09/28/2022 Name: Vanessa Palmer MRN: [id number] DOB: 15-May-1950  Vanessa Palmer is a 72 y.o. year old female who sees Inez Catalina, MD for primary care. I spoke with  [name] by phone today.  What matters to the patients health and wellness today?  I spoke with Vanessa Palmer, who lives alone. She is concerned about her blood pressure and swelling in her legs. We discussed her diet and the importance of elevating her feet when sitting. She will be taking 20 mg of potassium MEq once daily, and 20 mg of torsemide twice daily.    Goals Addressed             This Visit's Progress    I am concerned about my blood pressure and my swelling.       Patient Goals/Self Care Activities: -Patient/Caregiver will self-administer medications as prescribed as evidenced by self-report/primary caregiver report  -Patient/Caregiver will attend all scheduled provider appointments as evidenced by clinician review of documented attendance to scheduled appointments and patient/caregiver report -Patient/Caregiver will call provider office for new concerns or questions as evidenced by review of documented incoming telephone call notes and patient report  -Calls provider office for new concerns, questions, or BP outside discussed parameters -Checks BP and records as discussed -Follows a low sodium diet/DASH diet          SDOH assessments and interventions completed:  Yes  SDOH Interventions Today    Flowsheet Row Most Recent Value  SDOH Interventions   Food Insecurity Interventions Intervention Not Indicated  Transportation Interventions Intervention Not Indicated        Care Coordination Interventions:  Yes, provided   Interventions Today    Flowsheet Row Most Recent Value  Chronic Disease   Chronic disease during today's visit Hypertension (HTN)  General Interventions   General Interventions Discussed/Reviewed General Interventions  Discussed, General Interventions Reviewed  Nutrition Interventions   Nutrition Discussed/Reviewed Nutrition Discussed, Decreasing salt, Fluid intake  Pharmacy Interventions   Pharmacy Dicussed/Reviewed Pharmacy Topics Discussed  Safety Interventions   Safety Discussed/Reviewed Safety Reviewed        Follow up plan: Follow up call scheduled for 10/18/22 1 pm    Encounter Outcome:  Pt. Visit Completed   Juanell Fairly RN, BSN, [facility] Care Coordinator [facility]   Phone: [phone]

## 2022-09-28 NOTE — Patient Instructions (Signed)
Visit Information  Thank you for taking time to visit with me today. Please don't hesitate to contact me if I can be of assistance to you.   Following are the goals we discussed today:   Goals Addressed             This Visit's Progress    I am concerned about my blood pressure and my swelling.       Patient Goals/Self Care Activities: -Patient/Caregiver will self-administer medications as prescribed as evidenced by self-report/primary caregiver report  -Patient/Caregiver will attend all scheduled provider appointments as evidenced by clinician review of documented attendance to scheduled appointments and patient/caregiver report -Patient/Caregiver will call provider office for new concerns or questions as evidenced by review of documented incoming telephone call notes and patient report  -Calls provider office for new concerns, questions, or BP outside discussed parameters -Checks BP and records as discussed -Follows a low sodium diet/DASH diet          Our next appointment is by telephone on 10/18/22 at 1 pm  Please call the care guide team at [phone] if you need to cancel or reschedule your appointment.   If you are experiencing a Mental Health or Behavioral Health Crisis or need someone to talk to, please call 1-800-273-TALK (toll free, 24 hour hotline)   Patient verbalizes understanding of instructions and care plan provided today and agrees to view in MyChart. Active MyChart status and patient understanding of how to access instructions and care plan via MyChart confirmed with patient.     Juanell Fairly RN, BSN, [facility] Care Coordinator [facility]   Phone: [phone]

## 2022-10-05 ENCOUNTER — Encounter: Payer: Self-pay | Admitting: Internal Medicine

## 2022-10-05 ENCOUNTER — Ambulatory Visit: Payer: 59 | Admitting: Internal Medicine

## 2022-10-05 ENCOUNTER — Other Ambulatory Visit: Payer: Self-pay

## 2022-10-05 VITALS — BP 145/78 | HR 58 | Temp 97.9°F | Ht 62.0 in | Wt 289.8 lb

## 2022-10-05 DIAGNOSIS — N1831 Chronic kidney disease, stage 3a: Secondary | ICD-10-CM

## 2022-10-05 DIAGNOSIS — Z91013 Allergy to seafood: Secondary | ICD-10-CM | POA: Diagnosis not present

## 2022-10-05 DIAGNOSIS — F331 Major depressive disorder, recurrent, moderate: Secondary | ICD-10-CM

## 2022-10-05 DIAGNOSIS — M5451 Vertebrogenic low back pain: Secondary | ICD-10-CM

## 2022-10-05 DIAGNOSIS — N3946 Mixed incontinence: Secondary | ICD-10-CM

## 2022-10-05 DIAGNOSIS — I10 Essential (primary) hypertension: Secondary | ICD-10-CM

## 2022-10-05 DIAGNOSIS — M1611 Unilateral primary osteoarthritis, right hip: Secondary | ICD-10-CM

## 2022-10-05 DIAGNOSIS — E7800 Pure hypercholesterolemia, unspecified: Secondary | ICD-10-CM

## 2022-10-05 DIAGNOSIS — E785 Hyperlipidemia, unspecified: Secondary | ICD-10-CM

## 2022-10-05 DIAGNOSIS — I129 Hypertensive chronic kidney disease with stage 1 through stage 4 chronic kidney disease, or unspecified chronic kidney disease: Secondary | ICD-10-CM | POA: Diagnosis not present

## 2022-10-05 DIAGNOSIS — I2510 Atherosclerotic heart disease of native coronary artery without angina pectoris: Secondary | ICD-10-CM

## 2022-10-05 DIAGNOSIS — I890 Lymphedema, not elsewhere classified: Secondary | ICD-10-CM

## 2022-10-05 DIAGNOSIS — I25118 Atherosclerotic heart disease of native coronary artery with other forms of angina pectoris: Secondary | ICD-10-CM

## 2022-10-05 DIAGNOSIS — I2089 Other forms of angina pectoris: Secondary | ICD-10-CM

## 2022-10-05 DIAGNOSIS — Z0000 Encounter for general adult medical examination without abnormal findings: Secondary | ICD-10-CM

## 2022-10-05 DIAGNOSIS — Z86718 Personal history of other venous thrombosis and embolism: Secondary | ICD-10-CM

## 2022-10-05 DIAGNOSIS — R61 Generalized hyperhidrosis: Secondary | ICD-10-CM

## 2022-10-05 DIAGNOSIS — Z6841 Body Mass Index (BMI) 40.0 and over, adult: Secondary | ICD-10-CM

## 2022-10-05 MED ORDER — EPINEPHRINE 0.3 MG/0.3ML IJ SOAJ
0.3000 mg | INTRAMUSCULAR | 3 refills | Status: DC | PRN
Start: 2022-10-05 — End: 2023-11-28

## 2022-10-05 MED ORDER — SERTRALINE HCL 50 MG PO TABS
50.0000 mg | ORAL_TABLET | Freq: Every day | ORAL | 3 refills | Status: DC
Start: 2022-10-05 — End: 2023-01-18

## 2022-10-05 MED ORDER — HYDRALAZINE HCL 50 MG PO TABS
100.0000 mg | ORAL_TABLET | Freq: Two times a day (BID) | ORAL | 3 refills | Status: DC
Start: 2022-10-05 — End: 2022-11-27

## 2022-10-05 NOTE — Patient Instructions (Addendum)
Ms. Stroupe - -  Please INCREASE your hydralazine to 100mg  (2 tabs) twice daily  Please START zoloft (sertraline) for anxiety, 50mg  at night.   Please come back to see me in 4-6 weeks to see how your anxiety is doing.   Please wear your compression stockings daily!  If you need to, we can send you to the wound care clinic for help with your lymphedema.   I will call you with results from the night sweats lab work.   Thank you!

## 2022-10-05 NOTE — Assessment & Plan Note (Signed)
Reviewed today.  She is not very ambulatory.  She has chronic lymphedema which makes water therapy difficult.  She may need weight management medications in the future.  Consider nutrition consult at next visit.

## 2022-10-05 NOTE — Assessment & Plan Note (Signed)
LDL at last check was 38, she is due for recheck.  She is on atorvastatin.   Plan Continue atorvastatin Lipid profile today

## 2022-10-05 NOTE — Assessment & Plan Note (Signed)
She notes having a cologuard at last PCP office, will attempt to get records.

## 2022-10-05 NOTE — Assessment & Plan Note (Signed)
Refill epi-pen provided today.

## 2022-10-05 NOTE — Assessment & Plan Note (Signed)
She reports no chest pain or other symptoms today.  She is not very active.

## 2022-10-05 NOTE — Assessment & Plan Note (Signed)
This issue is well controlled on current therapy of tizanidine, gabapentin 600mg  BID and percocet. She has a pain management contract on file and she has been appropriate on her PDMP for refill purposes.  She notes that her pain is reasonably well controlled and she is able to complete most ADLs and iADLs with some assistance from her daughter.   Plan Continue triple therapy for pain.

## 2022-10-05 NOTE — Assessment & Plan Note (Signed)
She is taking her xarelto without issue.

## 2022-10-05 NOTE — Assessment & Plan Note (Signed)
Renal function improved with discontinuation of losartan and hydrochlorothiazide.  She notes no change in urinary habits at this time (some improvement in urgency with tolterodine).   Plan: Recheck renal function today.

## 2022-10-05 NOTE — Progress Notes (Signed)
Established Patient Office Visit  Subjective   Patient ID: Vanessa Palmer, female    DOB: 01-16-1951  Age: 72 y.o. MRN: [id number]  Chief Complaint  Patient presents with   Follow-up    Vanessa Palmer is a 72 yo woman with PMH of HLD, HTN, CAD, asthma, chronic pain, CKD 3, GERD, h/o DVT and anxiety who presents for follow up.   Vanessa Palmer has one new complaints, which is night sweats.  She notes having this for a long time, but it seems to have gotten worse in the last 2 months.  Many nights, she will wake up with sweaty clothes and sheets and have to take a cool shower and change clothes.  She has not had any weight loss, fever (subjective or objective) or other B symptoms.  She has not traveled and has no sick contacts.  She has had no change in bowel movements and no masses in the breasts.  She reports having a cologuard done at previous PCP which was negative (we do not currently have the records for this).    She brought in her medications today which we reviewed.  She was previously on sertraline, but does not have it currently.  Her PHQ 9 is 13.  She also noted being taken off of her losartan-hydrochlorothiazide combination pill due to worsening renal function, which is reasonable. Her blood pressure is mildly high today.    She has lymphedema and mostly does NOT wear compression.  Her daughter noted she would get some new stockings and have her wear them.    Pain is well controlled on tizanidine, gabapentin and percocet.     Review of Systems  Constitutional:  Positive for diaphoresis. Negative for chills, fever, malaise/fatigue and weight loss.  HENT:  Negative for hearing loss.   Eyes:  Negative for discharge and redness.  Respiratory:  Negative for cough and shortness of breath.   Cardiovascular:  Positive for leg swelling (chronic, lymphedema). Negative for chest pain.  Genitourinary:  Positive for frequency and urgency. Negative for dysuria.  Musculoskeletal:  Positive for  back pain and joint pain. Negative for falls.  Skin:  Negative for itching and rash.  Neurological:  Negative for dizziness and weakness.  Psychiatric/Behavioral:  Positive for depression. Negative for hallucinations. The patient is nervous/anxious.       Objective:     BP (!) 145/78 (BP Location: Left Arm, Patient Position: Sitting, Cuff Size: Large)   Pulse (!) 58   Temp 97.9 F (36.6 C) (Oral)   Ht 5\' 2"  (1.575 m)   Wt 289 lb 12.8 oz (131.5 kg)   SpO2 99% Comment: RA  BMI 53.01 kg/m    Physical Exam Vitals and nursing note reviewed.  Constitutional:      General: She is not in acute distress.    Appearance: Normal appearance. She is obese. She is not toxic-appearing or diaphoretic.  HENT:     Head: Normocephalic and atraumatic.  Cardiovascular:     Rate and Rhythm: Normal rate and regular rhythm.     Pulses: Normal pulses.     Heart sounds: No murmur heard. Pulmonary:     Effort: Pulmonary effort is normal. No respiratory distress.  Abdominal:     General: Bowel sounds are normal. There is no distension.     Palpations: Abdomen is soft.     Tenderness: There is no abdominal tenderness.  Musculoskeletal:        General: Swelling and tenderness present.  Comments: She has chronic lymphedema with weeping of the legs  Skin:    General: Skin is warm.     Comments: She has chronic weeping in the legs, chronic skin changes with hardening of the skin and swelling.   Neurological:     Mental Status: She is alert and oriented to person, place, and time. Mental status is at baseline.  Psychiatric:        Mood and Affect: Mood normal.        Behavior: Behavior normal.     The ASCVD Risk score (Arnett DK, et al., 2019) failed to calculate for the following reasons:   The valid total cholesterol range is 130 to 320 mg/dL    Assessment & Plan:   Problem List Items Addressed This Visit       Unprioritized   Hyperlipidemia (Chronic)    LDL at last check was 38, she  is due for recheck.  She is on atorvastatin.   Plan Continue atorvastatin Lipid profile today      Relevant Medications   torsemide (DEMADEX) 20 MG tablet   EPINEPHrine 0.3 mg/0.3 mL IJ SOAJ injection   hydrALAZINE (APRESOLINE) 50 MG tablet   Major depressive disorder, recurrent episode, moderate (HCC) (Chronic)    Currently not on therapy. PHQ-9 elevated.  Will restart sertraline.   Plan Sertraline 50mg  at bedtime Follow up in 6 weeks      Relevant Medications   sertraline (ZOLOFT) 50 MG tablet   Essential hypertension (Chronic)    BP today is elevated.  She was previously removed off of Hyzaar for the effect on her renal function, which is reasonable.    Plan Continue metoprolol, amlodipine INCREASE hydralazine to 100mg  BID Return in 6 weeks Renal function today.       Relevant Medications   torsemide (DEMADEX) 20 MG tablet   EPINEPHrine 0.3 mg/0.3 mL IJ SOAJ injection   hydrALAZINE (APRESOLINE) 50 MG tablet   Coronary atherosclerosis (Chronic)    She reports no chest pain or other symptoms today.  She is not very active.       Relevant Medications   torsemide (DEMADEX) 20 MG tablet   EPINEPHrine 0.3 mg/0.3 mL IJ SOAJ injection   hydrALAZINE (APRESOLINE) 50 MG tablet   Chronic kidney disease (CKD), stage III (moderate) (HCC) (Chronic)    Renal function improved with discontinuation of losartan and hydrochlorothiazide.  She notes no change in urinary habits at this time (some improvement in urgency with tolterodine).   Plan: Recheck renal function today.        Relevant Orders   CMP14 + Anion Gap   Urinary incontinence (Chronic)    Mild improvement with tolterodine.  We will continue this medication      Morbid obesity (HCC)    Reviewed today.  She is not very ambulatory.  She has chronic lymphedema which makes water therapy difficult.  She may need weight management medications in the future.  Consider nutrition consult at next visit.       Routine  health maintenance    She notes having a cologuard at last PCP office, will attempt to get records.       History of DVT (deep vein thrombosis), multiple, lifelong anticoagulation    She is taking her xarelto without issue.       Osteoarthritis of right hip    This issue is well controlled on current therapy of tizanidine, gabapentin 600mg  BID and percocet. She has a pain Mining engineer on  file and she has been appropriate on her PDMP for refill purposes.  She notes that her pain is reasonably well controlled and she is able to complete most ADLs and iADLs with some assistance from her daughter.   Plan Continue triple therapy for pain.       Chronic stable angina    She reports no chest pain or other symptoms today.  She is not very active.       Relevant Medications   torsemide (DEMADEX) 20 MG tablet   sertraline (ZOLOFT) 50 MG tablet   EPINEPHrine 0.3 mg/0.3 mL IJ SOAJ injection   hydrALAZINE (APRESOLINE) 50 MG tablet   Shellfish allergy - Primary    Refill epi-pen provided today.       Relevant Medications   EPINEPHrine 0.3 mg/0.3 mL IJ SOAJ injection   Other Relevant Orders   Lipid Profile   Lymphedema    She is not consistent with her use of compression.  She has significant chronic skin changes and open (non infected) wounds on the legs.  She would benefit from wound care and unna boots.  Her daughter would like to get her new compression stockings and try this first.   Plan Increased adherence to compression stockings Consider wound care consult in the future for UNNA boots.       Relevant Medications   torsemide (DEMADEX) 20 MG tablet   potassium chloride SA (KLOR-CON M) 20 MEQ tablet   Vertebrogenic low back pain    This issue is well controlled on current therapy of tizanidine, gabapentin 600mg  BID and percocet. She has a pain management contract on file and she has been appropriate on her PDMP for refill purposes.  She notes that her pain is reasonably well  controlled and she is able to complete most ADLs and iADLs with some assistance from her daughter.   Plan Continue triple therapy for pain.       Night sweats    Course and length of symptoms unclear.  She has no other B symptoms.  She seems to sleep in a cool dark room without excessive blankets.  Will plan an initial work up as follows  TB quantiferon gold TSH CBC HIV CRP LFTs  And go from there.       Relevant Orders   QuantiFERON-TB Gold Plus   CBC with Diff   CRP (C-Reactive Protein)   TSH   HIV antibody (with reflex)    Return in about 6 weeks (around 11/16/2022).    [name], MD

## 2022-10-05 NOTE — Assessment & Plan Note (Signed)
Mild improvement with tolterodine.  We will continue this medication

## 2022-10-05 NOTE — Assessment & Plan Note (Signed)
Course and length of symptoms unclear.  She has no other B symptoms.  She seems to sleep in a cool dark room without excessive blankets.  Will plan an initial work up as follows  TB quantiferon gold TSH CBC HIV CRP LFTs  And go from there.

## 2022-10-05 NOTE — Assessment & Plan Note (Signed)
BP today is elevated.  She was previously removed off of Hyzaar for the effect on her renal function, which is reasonable.    Plan Continue metoprolol, amlodipine INCREASE hydralazine to 100mg  BID Return in 6 weeks Renal function today.

## 2022-10-05 NOTE — Assessment & Plan Note (Addendum)
>>  ASSESSMENT AND PLAN FOR CHRONIC STABLE ANGINA WRITTEN ON 10/05/2022 10:00 AM BY Jodeci Roarty B, MD  She reports no chest pain or other symptoms today.  She is not very active.    >>ASSESSMENT AND PLAN FOR CORONARY ATHEROSCLEROSIS WRITTEN ON 10/05/2022 10:00 AM BY Lakrisha Iseman B, MD  She reports no chest pain or other symptoms today.  She is not very active.

## 2022-10-05 NOTE — Assessment & Plan Note (Signed)
Currently not on therapy. PHQ-9 elevated.  Will restart sertraline.   Plan Sertraline 50mg  at bedtime Follow up in 6 weeks

## 2022-10-05 NOTE — Assessment & Plan Note (Signed)
She is not consistent with her use of compression.  She has significant chronic skin changes and open (non infected) wounds on the legs.  She would benefit from wound care and unna boots.  Her daughter would like to get her new compression stockings and try this first.   Plan Increased adherence to compression stockings Consider wound care consult in the future for UNNA boots.

## 2022-10-11 ENCOUNTER — Other Ambulatory Visit: Payer: Self-pay | Admitting: Internal Medicine

## 2022-10-11 DIAGNOSIS — N1832 Chronic kidney disease, stage 3b: Secondary | ICD-10-CM

## 2022-10-18 ENCOUNTER — Ambulatory Visit: Payer: Self-pay

## 2022-10-18 ENCOUNTER — Other Ambulatory Visit: Payer: 59

## 2022-10-18 DIAGNOSIS — N1832 Chronic kidney disease, stage 3b: Secondary | ICD-10-CM | POA: Diagnosis not present

## 2022-10-18 NOTE — Patient Instructions (Signed)
Visit Information  Thank you for taking time to visit with me today. Please don't hesitate to contact me if I can be of assistance to you.   Following are the goals we discussed today:   Goals Addressed             This Visit's Progress    I am concerned about my blood pressure and my swelling.       Patient Goals/Self Care Activities: -Patient/Caregiver will self-administer medications as prescribed as evidenced by self-report/primary caregiver report  -Patient/Caregiver will attend all scheduled provider appointments as evidenced by clinician review of documented attendance to scheduled appointments and patient/caregiver report -Patient/Caregiver will call provider office for new concerns or questions as evidenced by review of documented incoming telephone call notes and patient report  -Calls provider office for new concerns, questions, or BP outside discussed parameters -Checks BP and records as discussed -Follows a low sodium diet/DASH diet  -check catalog and order bp monitor        Our next appointment is by telephone on 11/21/22 at 2 pm  Please call the care guide team at [phone] if you need to cancel or reschedule your appointment.   If you are experiencing a Mental Health or Behavioral Health Crisis or need someone to talk to, please call 1-800-273-TALK (toll free, 24 hour hotline)  Patient verbalizes understanding of instructions and care plan provided today and agrees to view in MyChart. Active MyChart status and patient understanding of how to access instructions and care plan via MyChart confirmed with patient.     Juanell Fairly RN, BSN, [facility] [employer] Phone: [phone]

## 2022-10-18 NOTE — Patient Outreach (Signed)
  Care Coordination   Follow Up Visit Note   10/18/2022 Name: Vanessa Palmer MRN: [id number] DOB: 1951/01/03  Vanessa Palmer is a 72 y.o. year old female who sees Vanessa Catalina, MD for primary care. I spoke with  Vanessa Palmer by phone today.  What matters to the patients health and wellness today?  Vanessa Palmer reported feeling generally well. She denied experiencing chest pain or headaches. Since she does not have a blood pressure monitor at home, I recommended that she order one from her catalog. She agreed to check her blood pressure at home. On her visit on 8/9/824, her blood pressure was 145/78, with the systolic reading slightly elevated. She mentioned experiencing some swelling in her legs, and I advised her to elevate her legs while sitting. She has a footstool that she can use for this purpose.    Goals Addressed             This Visit's Progress    I am concerned about my blood pressure and my swelling.       Patient Goals/Self Care Activities: -Patient/Caregiver will self-administer medications as prescribed as evidenced by self-report/primary caregiver report  -Patient/Caregiver will attend all scheduled provider appointments as evidenced by clinician review of documented attendance to scheduled appointments and patient/caregiver report -Patient/Caregiver will call provider office for new concerns or questions as evidenced by review of documented incoming telephone call notes and patient report  -Calls provider office for new concerns, questions, or BP outside discussed parameters -Checks BP and records as discussed -Follows a low sodium diet/DASH diet  -check catalog and order bp monitor        SDOH assessments and interventions completed:  No     Care Coordination Interventions:  Yes, provided   Interventions Today    Flowsheet Row Most Recent Value  Chronic Disease   Chronic disease during today's visit Hypertension (HTN)  General Interventions    General Interventions Discussed/Reviewed General Interventions Discussed, General Interventions Reviewed  Nutrition Interventions   Nutrition Discussed/Reviewed Nutrition Discussed, Decreasing salt  Pharmacy Interventions   Pharmacy Dicussed/Reviewed Pharmacy Topics Discussed  Safety Interventions   Safety Discussed/Reviewed Safety Reviewed        Follow up plan: Follow up call scheduled for 11/21/22  2 pm    Encounter Outcome:  Pt. Visit Completed   Juanell Fairly RN, BSN, [facility]   Care Coordinator Phone: [phone]

## 2022-10-19 LAB — BMP8+ANION GAP
Anion Gap: 14 mmol/L (ref 10.0–18.0)
BUN/Creatinine Ratio: 21 (ref 12–28)
BUN: 37 mg/dL — ABNORMAL HIGH (ref 8–27)
CO2: 25 mmol/L (ref 20–29)
Calcium: 8.9 mg/dL (ref 8.7–10.3)
Chloride: 102 mmol/L (ref 96–106)
Creatinine, Ser: 1.74 mg/dL — ABNORMAL HIGH (ref 0.57–1.00)
Glucose: 111 mg/dL — ABNORMAL HIGH (ref 70–99)
Potassium: 4 mmol/L (ref 3.5–5.2)
Sodium: 141 mmol/L (ref 134–144)
eGFR: 31 mL/min/{1.73_m2} — ABNORMAL LOW (ref >59)

## 2022-10-25 ENCOUNTER — Other Ambulatory Visit: Payer: Self-pay | Admitting: Physician Assistant

## 2022-10-25 ENCOUNTER — Telehealth: Payer: Self-pay | Admitting: Physician Assistant

## 2022-10-25 ENCOUNTER — Other Ambulatory Visit: Payer: Self-pay

## 2022-10-25 DIAGNOSIS — M2550 Pain in unspecified joint: Secondary | ICD-10-CM

## 2022-10-25 MED ORDER — TIZANIDINE HCL 4 MG PO TABS: 4.0000 mg | ORAL_TABLET | Freq: Three times a day (TID) | ORAL | 0 refills | Status: DC | PRN

## 2022-10-25 NOTE — Telephone Encounter (Signed)
Patient called. Would like her muscle relaxer called in. Her cb#[phone]

## 2022-10-25 NOTE — Telephone Encounter (Signed)
Please advise on message below. Thank you!

## 2022-10-26 MED ORDER — OXYCODONE-ACETAMINOPHEN 10-325 MG PO TABS
1.0000 | ORAL_TABLET | Freq: Three times a day (TID) | ORAL | 0 refills | Status: AC | PRN
Start: 2022-10-26 — End: 2022-11-25

## 2022-11-14 ENCOUNTER — Other Ambulatory Visit: Payer: Self-pay

## 2022-11-14 ENCOUNTER — Ambulatory Visit (INDEPENDENT_AMBULATORY_CARE_PROVIDER_SITE_OTHER): Payer: 59 | Admitting: Internal Medicine

## 2022-11-14 ENCOUNTER — Ambulatory Visit (HOSPITAL_COMMUNITY)
Admission: RE | Admit: 2022-11-14 | Discharge: 2022-11-14 | Disposition: A | Discharge status: Home / Self Care | Payer: 59 | Source: Ambulatory Visit | Attending: Internal Medicine | Admitting: Internal Medicine

## 2022-11-14 VITALS — BP 163/84 | HR 58 | Temp 98.0°F | Ht 62.0 in | Wt 287.7 lb

## 2022-11-14 DIAGNOSIS — G4733 Obstructive sleep apnea (adult) (pediatric): Secondary | ICD-10-CM

## 2022-11-14 DIAGNOSIS — N1831 Chronic kidney disease, stage 3a: Secondary | ICD-10-CM

## 2022-11-14 DIAGNOSIS — I890 Lymphedema, not elsewhere classified: Secondary | ICD-10-CM

## 2022-11-14 DIAGNOSIS — R61 Generalized hyperhidrosis: Secondary | ICD-10-CM

## 2022-11-14 DIAGNOSIS — M1711 Unilateral primary osteoarthritis, right knee: Secondary | ICD-10-CM

## 2022-11-14 DIAGNOSIS — M1712 Unilateral primary osteoarthritis, left knee: Secondary | ICD-10-CM

## 2022-11-14 DIAGNOSIS — F1011 Alcohol abuse, in remission: Secondary | ICD-10-CM

## 2022-11-14 DIAGNOSIS — K219 Gastro-esophageal reflux disease without esophagitis: Secondary | ICD-10-CM

## 2022-11-14 DIAGNOSIS — S29011A Strain of muscle and tendon of front wall of thorax, initial encounter: Secondary | ICD-10-CM | POA: Insufficient documentation

## 2022-11-14 DIAGNOSIS — F331 Major depressive disorder, recurrent, moderate: Secondary | ICD-10-CM | POA: Diagnosis not present

## 2022-11-14 DIAGNOSIS — I129 Hypertensive chronic kidney disease with stage 1 through stage 4 chronic kidney disease, or unspecified chronic kidney disease: Secondary | ICD-10-CM

## 2022-11-14 DIAGNOSIS — Z23 Encounter for immunization: Secondary | ICD-10-CM

## 2022-11-14 DIAGNOSIS — I517 Cardiomegaly: Secondary | ICD-10-CM

## 2022-11-14 DIAGNOSIS — I10 Essential (primary) hypertension: Secondary | ICD-10-CM

## 2022-11-14 DIAGNOSIS — M5451 Vertebrogenic low back pain: Secondary | ICD-10-CM

## 2022-11-14 DIAGNOSIS — E785 Hyperlipidemia, unspecified: Secondary | ICD-10-CM

## 2022-11-14 MED ORDER — GABAPENTIN 300 MG PO CAPS: 300.0000 mg | ORAL_CAPSULE | Freq: Three times a day (TID) | ORAL | 3 refills | Status: DC

## 2022-11-14 MED ORDER — AMLODIPINE BESYLATE 10 MG PO TABS
10.0000 mg | ORAL_TABLET | Freq: Every day | ORAL | 3 refills | Status: DC
Start: 2022-11-14 — End: 2023-06-11

## 2022-11-14 MED ORDER — ATORVASTATIN CALCIUM 40 MG PO TABS
40.0000 mg | ORAL_TABLET | Freq: Every day | ORAL | 3 refills | Status: DC
Start: 2022-11-14 — End: 2023-06-11

## 2022-11-14 NOTE — Assessment & Plan Note (Signed)
Renal function improved after last visit.   Plan Check renal function at next visit.

## 2022-11-14 NOTE — Assessment & Plan Note (Signed)
Ice, rest, trial of topical therapy.  Home Health PT ordered.  If no improvement, would get MRI of the shoulder.

## 2022-11-14 NOTE — Assessment & Plan Note (Signed)
Improved on sertraline.  She notes having this at home.  Continue to monitor.  Increase if tolerated at next visit.

## 2022-11-14 NOTE — Assessment & Plan Note (Signed)
Unable to exercise due to pain.  Will continue to discuss dietary options for her.

## 2022-11-14 NOTE — Assessment & Plan Note (Signed)
Currently not on therapy, unable to tolerate CPAP.  Discussed with her again today and she is not interested in readdressing.

## 2022-11-14 NOTE — Assessment & Plan Note (Signed)
Updated xray planned today, last on the right was 2010.  Anticipate disease will be worse.   Have ordered home health PT 11/14/22.  Also recommended that she see her orthopedic specialist for possible injectable therapy.

## 2022-11-14 NOTE — Assessment & Plan Note (Signed)
BP remains high despite increase in hydralazine. Will plan to increase amlodipine and reminded her to take her medications daily.   Plan Hydralazine 100mg  BID Metoprolol succinate 50mg  daily (pulse in the 50s-60s) Amlodipine - increase to 10mg  Check renal function at next visit.

## 2022-11-14 NOTE — Assessment & Plan Note (Signed)
Improving, maybe with change in weather.  Work up was non revealing.  She is due for Mammogram this year.

## 2022-11-14 NOTE — Assessment & Plan Note (Signed)
No acute issue.  Sober since 2006

## 2022-11-14 NOTE — Patient Instructions (Addendum)
For your shoulder pain, I think you have strained one of the muscles in your chest wall.    Please try ice, rest and avoid heavy lifting.  It would also help to have physical therapy.  You can continue to take your pain medication for this.  Avoid NSAIDs like advil or aleve.  You can use topical therapy like aspercreme or biofreeze.   For your knees, I would like for you to get updated xrays.  These are ordered.   For your nerve pain/symptoms, you are already on a high dose of gabapentin.  Please DECREASE this medication to 300mg  (one capsule) three times per day.  I would recommend following up with your orthopedic surgeon for further suggestions such as injectable therapy or surgery.   For your blood pressure, please INCREASE your amlodipine to 10mg  daily.   Come back for blood pressure visit in 1 month and to see your new PCP in 3 months.   Thank you!

## 2022-11-14 NOTE — Assessment & Plan Note (Addendum)
>>  ASSESSMENT AND PLAN FOR OA (OSTEOARTHRITIS) OF BILATERAL KNEES WRITTEN ON 10/14/2023  5:10 PM BY Tammra Pressman, MD   >>ASSESSMENT AND PLAN FOR UNILATERAL PRIMARY OSTEOARTHRITIS, RIGHT KNEE WRITTEN ON 11/14/2022 11:08 AM BY MULLEN, [name], MD  Updated xray planned today, last on the right was 2010.  Anticipate disease will be worse.   Have ordered home health PT 11/14/22.  Also recommended that she see her orthopedic specialist for possible injectable therapy.    >>ASSESSMENT AND PLAN FOR UNILATERAL PRIMARY OSTEOARTHRITIS, LEFT KNEE WRITTEN ON 11/14/2022 11:08 AM BY MULLEN, [name], MD  Updated xray planned today, last on the right was 2013.  Anticipate disease will be worse.   Have ordered home health PT 11/14/22.  Also recommended that she see her orthopedic specialist for possible injectable therapy.

## 2022-11-14 NOTE — Progress Notes (Signed)
Established Patient Office Visit  Subjective   Patient ID: Vanessa Palmer, female    DOB: [birth year]  Age: 72 y.o. MRN: [id number]  Chief Complaint  Patient presents with   Follow-up   Peripheral Neuropathy    Feet/fingertips   Pain    Underneath right arm pit.    [name] is a 72 year old woman with PMH of HTN, OSA, GERD, Vitamin D deficiency, DVTs, allergys, MDD, HLD, UI who presents for follow up.   She has 2 acute complaints, and we discussed her last complaint of night sweats.   She notes that the night sweats are better and do not seem concerning to her at this time.  Her blood work from last visit were reassuring.  She reports colon cancer screening and I have asked her to get this report for me, this was 1-2 years ago.   First acute problem is right arm pain. She notes that she uses a step stool to climb into her bed at night.  She will have to pull herself up with her arms and she felt a pulling sensation in her arm a couple of days ago on the right.  She notes that since that time she has had pain in the axilla and the right anterior chest on that side.  She had no tearing sensation, no bruising.    She further notes a long time history of neuropathy with burning sensation in her right hand and feeling like "rocks" are in her shoes (numbness).  She will get relief from the pain with her pain medications and with lying down.  She is on gabapentin.     Review of Systems  Constitutional:  Positive for diaphoresis (occasional at night). Negative for chills, fever, malaise/fatigue and weight loss.  Respiratory:  Positive for shortness of breath (with ambulation). Negative for cough and wheezing.   Cardiovascular:  Positive for chest pain (chest wall and right axilla) and leg swelling. Negative for palpitations and orthopnea.  Gastrointestinal:  Positive for heartburn (better with tums). Negative for constipation and diarrhea.  Genitourinary:  Negative for dysuria and  urgency.  Musculoskeletal:  Positive for back pain, joint pain, myalgias and neck pain. Negative for falls.  Neurological:  Positive for tingling, sensory change and weakness (generalized). Negative for speech change and focal weakness.  Psychiatric/Behavioral:  Negative for depression. The patient is not nervous/anxious.       Objective:     BP (!) 163/84 (BP Location: Right Arm, Patient Position: Sitting, Cuff Size: Large)   Pulse (!) 58   Temp 98 F (36.7 C) (Oral)   Ht 5\' 2"  (1.575 m)   Wt 287 lb 11.2 oz (130.5 kg)   SpO2 99% Comment: RA  BMI 52.62 kg/m    Physical Exam Vitals and nursing note reviewed.  Constitutional:      General: She is not in acute distress.    Appearance: Normal appearance. She is obese. She is not toxic-appearing.  HENT:     Head: Normocephalic and atraumatic.  Eyes:     General:        Right eye: No discharge.        Left eye: No discharge.     Conjunctiva/sclera: Conjunctivae normal.  Cardiovascular:     Rate and Rhythm: Normal rate and regular rhythm.     Heart sounds: No murmur heard. Musculoskeletal:     Right shoulder: Tenderness present. No swelling, deformity or bony tenderness. Decreased range of motion. Decreased strength.  Left shoulder: Normal.     Right upper arm: Tenderness present. No swelling, edema, deformity or lacerations.     Left upper arm: Normal.     Right knee: Swelling and bony tenderness present. Decreased range of motion. Tenderness present over the medial joint line and lateral joint line.     Left knee: Swelling and bony tenderness present. Decreased range of motion. Tenderness present over the medial joint line and lateral joint line.     Right foot: Tenderness present.     Left foot: Tenderness present.     Comments: Decreased strength on the right with internal and external rotation.  Pain with abduction beyond 90 degrees.  She has normal drop test.  Normal empty can. Strength is 4/5, unclear if due to pain or  true weakness.   Neurological:     Mental Status: She is alert.     The ASCVD Risk score (Arnett DK, et al., 2019) failed to calculate for the following reasons:   The valid total cholesterol range is 130 to 320 mg/dL    Assessment & Plan:   Problem List Items Addressed This Visit       Unprioritized   Hyperlipidemia (Chronic)    Check LDL.  Continue atorvastatin which was refilled.       Relevant Medications   amLODipine (NORVASC) 10 MG tablet   atorvastatin (LIPITOR) 40 MG tablet   Major depressive disorder, recurrent episode, moderate (HCC) (Chronic)    Improved on sertraline.  She notes having this at home.  Continue to monitor.  Increase if tolerated at next visit.       Essential hypertension (Chronic)    BP remains high despite increase in hydralazine. Will plan to increase amlodipine and reminded her to take her medications daily.   Plan Hydralazine 100mg  BID Metoprolol succinate 50mg  daily (pulse in the 50s-60s) Amlodipine - increase to 10mg  Check renal function at next visit.       Relevant Medications   amLODipine (NORVASC) 10 MG tablet   atorvastatin (LIPITOR) 40 MG tablet   Chronic kidney disease (CKD), stage III (moderate) (HCC) (Chronic)    Renal function improved after last visit.   Plan Check renal function at next visit.       GERD (gastroesophageal reflux disease) (Chronic)    She notes only taking Tums for this.  She takes Tums daily.  Would address more thoroughly at next visit, as multiple medications were changed today.       Vertebrogenic low back pain (Chronic)    Have ordered home health PT 11/14/22.  Also recommended that she see her orthopedic specialist for possible injectable therapy.       Morbid obesity (HCC)    Unable to exercise due to pain.  Will continue to discuss dietary options for her.       History of alcohol abuse    No acute issue.  Sober since 2006      SLEEP APNEA, OBSTRUCTIVE    Currently not on therapy,  unable to tolerate CPAP.  Discussed with her again today and she is not interested in readdressing.       LVH (left ventricular hypertrophy)    Last TTE from 2021.  She is on diuretics.  She has lymphedema which is the main cause of her swelling.  Grade 1 DD seen, no acute findings of heart failure.       Relevant Medications   amLODipine (NORVASC) 10 MG tablet   atorvastatin (LIPITOR) 40  MG tablet   Unilateral primary osteoarthritis, left knee - Primary    Updated xray planned today, last on the right was 2013.  Anticipate disease will be worse.   Have ordered home health PT 11/14/22.  Also recommended that she see her orthopedic specialist for possible injectable therapy.       Relevant Orders   DG Knee Complete 4 Views Left   Ambulatory referral to Home Health   Unilateral primary osteoarthritis, right knee    Updated xray planned today, last on the right was 2010.  Anticipate disease will be worse.   Have ordered home health PT 11/14/22.  Also recommended that she see her orthopedic specialist for possible injectable therapy.       Relevant Orders   DG Knee Complete 4 Views Right   Ambulatory referral to Home Health   Lymphedema   Night sweats    Improving, maybe with change in weather.  Work up was non revealing.  She is due for Mammogram this year.       Pectoralis muscle strain    Ice, rest, trial of topical therapy.  Home Health PT ordered.  If no improvement, would get MRI of the shoulder.       Relevant Orders   Ambulatory referral to Home Health   Other Visit Diagnoses     Encounter for immunization       Relevant Orders   Flu Vaccine Trivalent High Dose (Fluad) (Completed)       Return in about 3 months (around 02/13/2023).    [name], MD

## 2022-11-14 NOTE — Assessment & Plan Note (Signed)
Updated xray planned today, last on the right was 2013.  Anticipate disease will be worse.   Have ordered home health PT 11/14/22.  Also recommended that Vanessa Palmer see her orthopedic specialist for possible injectable therapy.

## 2022-11-14 NOTE — Assessment & Plan Note (Signed)
She notes only taking Tums for this.  She takes Tums daily.  Would address more thoroughly at next visit, as multiple medications were changed today.

## 2022-11-14 NOTE — Assessment & Plan Note (Signed)
Have ordered home health PT 11/14/22.  Also recommended that she see her orthopedic specialist for possible injectable therapy.

## 2022-11-14 NOTE — Assessment & Plan Note (Signed)
Check LDL.  Continue atorvastatin which was refilled.

## 2022-11-14 NOTE — Assessment & Plan Note (Signed)
Last TTE from 2021.  She is on diuretics.  She has lymphedema which is the main cause of her swelling.  Grade 1 DD seen, no acute findings of heart failure.

## 2022-11-15 ENCOUNTER — Telehealth: Payer: Self-pay | Admitting: *Deleted

## 2022-11-15 NOTE — Telephone Encounter (Signed)
Paper work for [employer] for [employer] /[employer] folder # 5 placed in Dr [employer] box to be completed. Dr. Criselda Peaches is aware folder is in her box.

## 2022-11-17 ENCOUNTER — Emergency Department (HOSPITAL_COMMUNITY): Payer: 59

## 2022-11-17 ENCOUNTER — Emergency Department (HOSPITAL_COMMUNITY)
Admission: EM | Admit: 2022-11-17 | Discharge: 2022-11-17 | Disposition: A | Discharge status: Home / Self Care | Payer: 59 | Attending: Emergency Medicine | Admitting: Emergency Medicine

## 2022-11-17 ENCOUNTER — Telehealth: Payer: Self-pay

## 2022-11-17 DIAGNOSIS — M545 Low back pain, unspecified: Secondary | ICD-10-CM | POA: Insufficient documentation

## 2022-11-17 DIAGNOSIS — Z7982 Long term (current) use of aspirin: Secondary | ICD-10-CM | POA: Insufficient documentation

## 2022-11-17 DIAGNOSIS — Z91010 Allergy to peanuts: Secondary | ICD-10-CM | POA: Insufficient documentation

## 2022-11-17 DIAGNOSIS — N3000 Acute cystitis without hematuria: Secondary | ICD-10-CM | POA: Insufficient documentation

## 2022-11-17 LAB — CBC WITH DIFFERENTIAL/PLATELET
Abs Immature Granulocytes: 0.01 10*3/uL (ref 0.00–0.07)
Basophils Absolute: 0 10*3/uL (ref 0.0–0.1)
Basophils Relative: 1 %
Eosinophils Absolute: 0.2 10*3/uL (ref 0.0–0.5)
Eosinophils Relative: 4 %
HCT: 30.4 % — ABNORMAL LOW (ref 36.0–46.0)
Hemoglobin: 10 g/dL — ABNORMAL LOW (ref 12.0–15.0)
Immature Granulocytes: 0 %
Lymphocytes Relative: 31 %
Lymphs Abs: 1.5 10*3/uL (ref 0.7–4.0)
MCH: 31 pg (ref 26.0–34.0)
MCHC: 32.9 g/dL (ref 30.0–36.0)
MCV: 94.1 fL (ref 80.0–100.0)
Monocytes Absolute: 0.5 10*3/uL (ref 0.1–1.0)
Monocytes Relative: 10 %
Neutro Abs: 2.7 10*3/uL (ref 1.7–7.7)
Neutrophils Relative %: 54 %
Platelets: 161 10*3/uL (ref 150–400)
RBC: 3.23 MIL/uL — ABNORMAL LOW (ref 3.87–5.11)
RDW: 13.2 % (ref 11.5–15.5)
WBC: 5 10*3/uL (ref 4.0–10.5)
nRBC: 0 % (ref 0.0–0.2)

## 2022-11-17 LAB — URINALYSIS, W/ REFLEX TO CULTURE (INFECTION SUSPECTED)
Bilirubin Urine: NEGATIVE
Glucose, UA: NEGATIVE mg/dL
Hgb urine dipstick: NEGATIVE
Ketones, ur: NEGATIVE mg/dL
Nitrite: POSITIVE — AB
Protein, ur: NEGATIVE mg/dL
Specific Gravity, Urine: 1.008 (ref 1.005–1.030)
WBC, UA: >50 WBC/hpf (ref 0–5)
pH: 6 (ref 5.0–8.0)

## 2022-11-17 LAB — BASIC METABOLIC PANEL
Anion gap: 9 (ref 5–15)
BUN: 29 mg/dL — ABNORMAL HIGH (ref 8–23)
CO2: 23 mmol/L (ref 22–32)
Calcium: 8.6 mg/dL — ABNORMAL LOW (ref 8.9–10.3)
Chloride: 102 mmol/L (ref 98–111)
Creatinine, Ser: 1.9 mg/dL — ABNORMAL HIGH (ref 0.44–1.00)
GFR, Estimated: 28 mL/min — ABNORMAL LOW (ref >60)
Glucose, Bld: 109 mg/dL — ABNORMAL HIGH (ref 70–99)
Potassium: 4.7 mmol/L (ref 3.5–5.1)
Sodium: 134 mmol/L — ABNORMAL LOW (ref 135–145)

## 2022-11-17 MED ORDER — METHOCARBAMOL 500 MG PO TABS
500.0000 mg | ORAL_TABLET | Freq: Once | ORAL | Status: AC
  Administered 2022-11-17: 500 mg via ORAL
  Filled 2022-11-17: qty 1

## 2022-11-17 MED ORDER — SODIUM CHLORIDE 0.9 % IV SOLN
2.0000 g | Freq: Once | INTRAVENOUS | Status: AC
  Administered 2022-11-17: 2 g via INTRAVENOUS
  Filled 2022-11-17: qty 20

## 2022-11-17 MED ORDER — DIAZEPAM 5 MG/ML IJ SOLN
2.5000 mg | Freq: Once | INTRAMUSCULAR | Status: AC
  Administered 2022-11-17: 2.5 mg via INTRAVENOUS
  Filled 2022-11-17: qty 2

## 2022-11-17 MED ORDER — FENTANYL CITRATE PF 50 MCG/ML IJ SOSY
100.0000 ug | PREFILLED_SYRINGE | Freq: Once | INTRAMUSCULAR | Status: AC
  Administered 2022-11-17: 100 ug via INTRAMUSCULAR
  Filled 2022-11-17: qty 2

## 2022-11-17 MED ORDER — CEFTRIAXONE SODIUM 1 G IJ SOLR: 1.0000 g | Freq: Once | INTRAMUSCULAR | Status: DC

## 2022-11-17 MED ORDER — ACETAMINOPHEN 500 MG PO TABS
1000.0000 mg | ORAL_TABLET | Freq: Once | ORAL | Status: AC
  Administered 2022-11-17: 1000 mg via ORAL
  Filled 2022-11-17: qty 2

## 2022-11-17 MED ORDER — CYCLOBENZAPRINE HCL 10 MG PO TABS: 10.0000 mg | ORAL_TABLET | Freq: Two times a day (BID) | ORAL | 0 refills | Status: DC | PRN

## 2022-11-17 MED ORDER — CEPHALEXIN 500 MG PO CAPS: 500.0000 mg | ORAL_CAPSULE | Freq: Three times a day (TID) | ORAL | 0 refills | Status: AC

## 2022-11-17 NOTE — ED Provider Notes (Signed)
EMERGENCY DEPARTMENT AT [facility] Provider Note   CSN: [id number] Arrival date & time: 11/17/22  4098     History  Chief Complaint  Patient presents with   Back Pain    Vanessa Palmer is a 72 y.o. female.  72 yo F here with back pain. Was sitting on the toilet and when she went to stand up she had severe back pain. Took like 30 minutes to stand up. Since then she has been having pain with certain movements. Worse on the right. No recent illnesses, urinary symptoms, trauma, fevers, weakness or incontinence. Hasn't tried anything for the symptoms.    Back Pain      Home Medications Prior to Admission medications   Medication Sig Start Date End Date Taking? Authorizing Provider  amLODipine (NORVASC) 10 MG tablet Take 1 tablet (10 mg total) by mouth daily. 11/14/22   Inez Catalina, MD  aspirin EC 81 MG tablet Take 81 mg by mouth daily. Swallow whole.    [provider]  atorvastatin (LIPITOR) 40 MG tablet Take 1 tablet (40 mg total) by mouth daily. 11/14/22   Inez Catalina, MD  budesonide-formoterol (SYMBICORT) 160-4.5 MCG/ACT inhaler Inhale 2 puffs into the lungs 3 times/day as needed-between meals & bedtime. 07/27/22   Inez Catalina, MD  EPINEPHrine 0.3 mg/0.3 mL IJ SOAJ injection Inject 0.3 mg into the muscle as needed for anaphylaxis. 10/05/22   Inez Catalina, MD  gabapentin (NEURONTIN) 300 MG capsule Take 1 capsule (300 mg total) by mouth 3 (three) times daily. 11/14/22   Inez Catalina, MD  hydrALAZINE (APRESOLINE) 50 MG tablet Take 2 tablets (100 mg total) by mouth in the morning and at bedtime. 10/05/22   Inez Catalina, MD  hydrOXYzine (ATARAX) 25 MG tablet Take 25 mg by mouth every 6 (six) hours as needed for itching.    [provider]  metoprolol succinate (TOPROL-XL) 50 MG 24 hr tablet TAKE 1 TABLET(50 MG) BY MOUTH DAILY 08/22/22   Inez Catalina, MD  nitroGLYCERIN (NITROSTAT) 0.4 MG SL tablet Place 1 tablet (0.4 mg total)  under the tongue every 5 (five) minutes as needed for chest pain. 03/11/20   Inez Catalina, MD  oxyCODONE-acetaminophen (PERCOCET) 10-325 MG tablet Take 1 tablet by mouth every 8 (eight) hours as needed for pain (chronic arthritis pain, multiple sites, diag code M16.11, M25.50). 10/26/22 11/25/22  Inez Catalina, MD  potassium chloride SA (KLOR-CON M) 20 MEQ tablet Take 20 mEq by mouth daily. 09/27/22   [provider]  rivaroxaban (XARELTO) 20 MG TABS tablet TAKE ONE TABLET BY MOUTH DAILY WITH SUPPER 09/04/22   Marrianne Mood, MD  sertraline (ZOLOFT) 50 MG tablet Take 1 tablet (50 mg total) by mouth at bedtime. 10/05/22   Inez Catalina, MD  tolterodine (DETROL LA) 2 MG 24 hr capsule TAKE 1 CAPSULE(2 MG) BY MOUTH DAILY 08/22/22   Inez Catalina, MD  torsemide (DEMADEX) 20 MG tablet Take 20 mg by mouth 2 (two) times daily. 09/27/22   [provider]      Allergies    Ace inhibitors, Other, Regadenoson, Shrimp [shellfish allergy], Peanut oil, Hydrocodone-acetaminophen, Hydromorphone hcl, Oxycodone-acetaminophen, Propoxyphene n-acetaminophen, and Vicodin [hydrocodone-acetaminophen]    Review of Systems   Review of Systems  Musculoskeletal:  Positive for back pain.    Physical Exam Updated Vital Signs BP 125/68 (BP Location: Left Arm)   Pulse 60   Temp 98.1 F (36.7 C) (Oral)  Resp 18   Ht 5\' 2"  (1.575 m)   Wt 130.5 kg   SpO2 100%   BMI 52.62 kg/m  Physical Exam Vitals and nursing note reviewed.  Constitutional:      Appearance: She is well-developed.  HENT:     Head: Normocephalic and atraumatic.  Cardiovascular:     Rate and Rhythm: Normal rate and regular rhythm.  Pulmonary:     Effort: No respiratory distress.     Breath sounds: No stridor.  Abdominal:     General: There is no distension.  Musculoskeletal:        General: Tenderness (across the whole mid back. no rash. no deformity. no stepoff. pain no worse in midline.) present.     Cervical back: Normal  range of motion.  Neurological:     Mental Status: She is alert.     Comments: Normal sensation to LE's, no weakness, no saddle anesthesia.      ED Results / Procedures / Treatments   Labs (all labs ordered are listed, but only abnormal results are displayed) Labs Reviewed  CBC WITH DIFFERENTIAL/PLATELET  BASIC METABOLIC PANEL  URINALYSIS, W/ REFLEX TO CULTURE (INFECTION SUSPECTED)    EKG None  Radiology No results found.  Procedures Procedures    Medications Ordered in ED Medications  methocarbamol (ROBAXIN) tablet 500 mg (has no administration in time range)  acetaminophen (TYLENOL) tablet 1,000 mg (has no administration in time range)    ED Course/ Medical Decision Making/ A&P Clinical Course as of 11/18/22 0555  Sat Nov 17, 2022  4098 Feeling better on recheck. Concern for UTI, culture sent, given abx here, not septic/no pyelo. Tolerating PO. Give abx for home [SG]  1045 Creatinine(!): 1.90 Similar to prior [SG]    Clinical Course User Index [SG] Sloan Leiter, DO                                 Medical Decision Making Amount and/or Complexity of Data Reviewed Labs: ordered. Decision-making details documented in ED Course. Radiology: ordered.  Risk OTC drugs. Prescription drug management.  Seems MSK, no red flags to suggest need for imaging at this time. Will treat for same. Labs to ensure no dehydration contributing.  Her urine is consistent with infection.  Culture ordered.  Her pain is close after kidneys to consider possible pyelonephritis although generally would not present like this we will get a CT scan to evaluate for same.  GFR is low and she is got enough adipose tissue that I think a Noncon scan should be okay.  Antibiotics started.  Care transferred pending CT scan, reevaluation and this position. Pain seems to be improving.    Final Clinical Impression(s) / ED Diagnoses Final diagnoses:  None    Rx / DC Orders ED Discharge Orders      None         Tylerjames Hoglund, Barbara Cower, MD 11/18/22 [phone]

## 2022-11-17 NOTE — Discharge Instructions (Signed)
It was a pleasure caring for you today in the emergency department.  If you start to have worsening pain, nausea and vomiting, bed fever, bad abdominal pain.  Any worsening or worrisome symptoms please return to the ER for reevaluation  Please return to the emergency department for any worsening or worrisome symptoms.

## 2022-11-17 NOTE — ED Triage Notes (Addendum)
Pt BIB [facility] from the [facility] d/t pain in back between shoulder blades - onset Friday morning. Took muscle relaxer at home and ASA but did not help.

## 2022-11-17 NOTE — ED Provider Notes (Signed)
  Provider Note MRN:  [id number]  Arrival date & time: 11/17/22    ED Course and Medical Decision Making  Assumed care from mesner at shift change.  See note from prior team for complete details, in brief:  72 yo female Back pain when using bathroom Better with analgesics UTI on UA, given rocephin  Pending imaging   Plan per prior physician follow-up labs and imaging, recheck  UTI on urinalysis, no nausea or vomiting, no evidence of pyelonephritis on imaging.  Imaging stable.  She is feeling much better on recheck.  Given Rocephin earlier.  Start Keflex for home, muscle relaxer for home.  Follow-up PCP for recheck  Patient presents with low back pain without signs of spinal cord compression, cauda equina syndrome, infection, aneurysm, or other serious etiology. The patient is neurologically intact. Given the extremely low risk of these diagnoses further testing and evaluation for these possibilities does not appear to be indicated at this time. Detailed discussions were had with the patient and/or family and caregivers, regarding current findings, and need for close f/u with PCP or on call doctor. The patient has been instructed to return immediately if the symptoms worsen in any way. Patient verbalized understanding and is in agreement with current care plan. All questions answered prior to discharge.    Procedures  Final Clinical Impressions(s) / ED Diagnoses     ICD-10-CM   1. Acute cystitis without hematuria  N30.00     2. Low back pain, unspecified back pain laterality, unspecified chronicity, unspecified whether sciatica present  M54.50       ED Discharge Orders          Ordered    cephALEXin (KEFLEX) 500 MG capsule  3 times daily        11/17/22 1046    cyclobenzaprine (FLEXERIL) 10 MG tablet  2 times daily PRN        11/17/22 1046              Discharge Instructions      It was a pleasure caring for you today in the emergency department.  If you start to have  worsening pain, nausea and vomiting, bed fever, bad abdominal pain.  Any worsening or worrisome symptoms please return to the ER for reevaluation  Please return to the emergency department for any worsening or worrisome symptoms.          Tanda Rockers A, DO 11/17/22 1049

## 2022-11-17 NOTE — Telephone Encounter (Signed)
Received a call stating medication prescribed were not at [facility]. They were escribed. [facility] called, they have the scripts are are almost done filling them. Patient called back with this information.

## 2022-11-19 LAB — URINE CULTURE: Culture: >=100000 — AB

## 2022-11-20 ENCOUNTER — Telehealth (HOSPITAL_BASED_OUTPATIENT_CLINIC_OR_DEPARTMENT_OTHER): Payer: Self-pay | Admitting: *Deleted

## 2022-11-20 NOTE — Telephone Encounter (Signed)
Post ED Visit - Positive Culture Follow-up  Culture report reviewed by antimicrobial stewardship pharmacist: [facility] [x]  [facility].D. []  Celedonio Miyamoto, Pharm.D., BCPS AQ-ID []  Garvin Fila, Pharm.D., BCPS []  Georgina Pillion, [address].D., BCPS []  [place], [address].D., BCPS, AAHIVP []  Estella Husk, Pharm.D., BCPS, AAHIVP []  Lysle Pearl, PharmD, BCPS []  Phillips Climes, PharmD, BCPS []  Agapito Games, PharmD, BCPS []  Verlan Friends, PharmD []  Mervyn Gay, PharmD, BCPS []  Vinnie Level, PharmD  [facility] []  Len Childs, PharmD []  Greer Pickerel, PharmD []  Adalberto Cole, PharmD []  Perlie Gold, Rph []  Lonell Face) Jean Rosenthal, PharmD []  Earl Many, PharmD []  Junita Push, PharmD []  Dorna Leitz, PharmD []  Terrilee Files, PharmD []  Lynann Beaver, PharmD []  Keturah Barre, PharmD []  Loralee Pacas, PharmD []  Bernadene Person, PharmD   Positive urine culture Treated with Cephalexin, organism sensitive to the same and no further patient follow-up is required at this time.  Nena Polio Garner Nash 11/20/2022, 9:10 AM

## 2022-11-21 ENCOUNTER — Telehealth: Payer: Self-pay

## 2022-11-21 NOTE — Patient Outreach (Signed)
Care Coordination   11/21/2022 Name: Vanessa Palmer MRN: [id number] DOB: [birth year]   Care Coordination Outreach Attempts:  An unsuccessful telephone outreach was attempted today to offer the patient information about available care coordination services.  Follow Up Plan:  Additional outreach attempts will be made to offer the patient care coordination information and services.   Encounter Outcome:  No Answer   Care Coordination Interventions:  No, not indicated    Juanell Fairly RN, BSN, [facility] [employer] Phone: [phone]

## 2022-11-23 ENCOUNTER — Encounter: Payer: 59 | Admitting: Internal Medicine

## 2022-11-26 ENCOUNTER — Other Ambulatory Visit: Payer: Self-pay

## 2022-11-26 DIAGNOSIS — M2550 Pain in unspecified joint: Secondary | ICD-10-CM

## 2022-11-27 ENCOUNTER — Telehealth: Payer: Self-pay

## 2022-11-27 DIAGNOSIS — I10 Essential (primary) hypertension: Secondary | ICD-10-CM

## 2022-11-27 NOTE — Addendum Note (Signed)
Addended by: Angelina Ok F on: 11/27/2022 02:01 PM   Modules accepted: Orders

## 2022-11-27 NOTE — Telephone Encounter (Signed)
RTC to [place] wanted to notify of possible reaction of the Detrol and patient's Potassium.  Also requesting PT 2 times a week for 4 weeks and then 1 time a week for 3 weeks.  To work on Development worker, international aid, Conservation officer, nature, Armed forces logistics/support/administrative officer. Verbal approval given for PT HH.

## 2022-11-27 NOTE — Telephone Encounter (Signed)
Gretchen with [facility] requesting VO for PT and also to discuss about medication interaction on tolterodine (DETROL LA) 2 MG 24 hr capsule and potassium chloride SA (KLOR-CON M) 20 MEQ tablet. Please call back @ [phone].

## 2022-11-28 ENCOUNTER — Telehealth: Payer: Self-pay

## 2022-11-28 MED ORDER — OXYCODONE-ACETAMINOPHEN 10-325 MG PO TABS: 1.0000 | ORAL_TABLET | Freq: Three times a day (TID) | ORAL | 0 refills | Status: DC | PRN

## 2022-11-28 NOTE — Telephone Encounter (Signed)
Agree with VO.   Please call Vanessa Palmer and advise her not to take the tolterodine and potassium at the same time (one in the evening, one in the morning)

## 2022-11-28 NOTE — Patient Outreach (Signed)
Care Coordination   Follow Up Visit Note   11/28/2022 Name: Vanessa Palmer MRN: [id number] DOB: 1951/01/24  Vanessa Palmer is a 72 y.o. year old female who sees Inez Catalina, MD for primary care. I spoke with  Vanessa Palmer by phone today.  What matters to the patients health and wellness today?  I spoke with Vanessa Palmer today, and she let me know that she had to go to the ED on September 21st because she had a bladder infection. She was given antibiotics and a muscle relaxer. She is still taking the antibiotics, and she is doing well. She did say that she is still waiting to hear about getting her pain medication, Percocet, filled. I told her I did see whether the message was received on the computer and to allow them to speak with the doctor, but she could give them a callback tomorrow if she still hadn't heard anything.    Goals Addressed             This Visit's Progress    I have a bladder infection       Patient Goals/Self Care Activities: -Patient/Caregiver will take medications as prescribed   -Patient/Caregiver will attend all scheduled provider appointments -Patient/Caregiver will call pharmacy for medication refills 3-7 days in advance of running out of medications -Patient/Caregiver will call provider office for new concerns or questions  -Patient/Caregiver will focus on medication adherence by taking medications as prescribed   Evaluation of current treatment plan related to patient's adherence to plan as established by provider Take all of your antibiotics Drink water to flush your system If you start to have worsening pain, nausea and vomiting, high fever, bad abdominal pain. Any worsening or worrisome symptoms please return to the ER for reevaluation         SDOH assessments and interventions completed:  No     Care Coordination Interventions:  Yes, provided   Follow Up Plan: RNCM will follow up at the next scheduled Interval.   Encounter Outcome:   Patient Visit Completed

## 2022-11-28 NOTE — Patient Instructions (Signed)
Visit Information  Thank you for taking time to visit with me today. Please don't hesitate to contact me if I can be of assistance to you.   Following are the goals we discussed today:   Goals Addressed             This Visit's Progress    I have a bladder infection       Patient Goals/Self Care Activities: -Patient/Caregiver will take medications as prescribed   -Patient/Caregiver will attend all scheduled provider appointments -Patient/Caregiver will call pharmacy for medication refills 3-7 days in advance of running out of medications -Patient/Caregiver will call provider office for new concerns or questions  -Patient/Caregiver will focus on medication adherence by taking medications as prescribed   Evaluation of current treatment plan related to patient's adherence to plan as established by provider Take all of your antibiotics Drink water to flush your system If you start to have worsening pain, nausea and vomiting, high fever, bad abdominal pain. Any worsening or worrisome symptoms please return to the ER for reevaluation         If you are experiencing a Mental Health or Behavioral Health Crisis or need someone to talk to, please call 1-800-273-TALK (toll free, 24 hour hotline)   Patient verbalizes understanding of instructions and care plan provided today and agrees to view in MyChart. Active MyChart status and patient understanding of how to access instructions and care plan via MyChart confirmed with patient.     Follow Up Plan: RNCM will follow up at the next scheduled Interval.  Juanell Fairly RN, BSN, [facility]   Care Coordinator Phone: [phone]

## 2022-11-29 NOTE — Telephone Encounter (Signed)
Attempts to contact patient X 2.  Unable to leave a message.  Tried contact no answers.

## 2022-12-05 ENCOUNTER — Telehealth: Payer: Self-pay | Admitting: *Deleted

## 2022-12-05 NOTE — Telephone Encounter (Signed)
PCS  form faxed to [facility] / [facility] [place] for review -scheduling for assessment . [phone] / [phone]

## 2022-12-11 ENCOUNTER — Ambulatory Visit: Payer: Self-pay

## 2022-12-11 NOTE — Patient Instructions (Signed)
Visit Information  Thank you for taking time to visit with me today. Please don't hesitate to contact me if I can be of assistance to you.   Following are the goals we discussed today:   Goals Addressed             This Visit's Progress    I am concerned about my blood pressure and my swelling.       Patient Goals/Self Care Activities: -Patient/Caregiver will self-administer medications as prescribed as evidenced by self-report/primary caregiver report  -Patient/Caregiver will attend all scheduled provider appointments as evidenced by clinician review of documented attendance to scheduled appointments and patient/caregiver report -Patient/Caregiver will call provider office for new concerns or questions as evidenced by review of documented incoming telephone call notes and patient report  -Calls provider office for new concerns, questions, or BP outside discussed parameters -Checks BP and records as discussed -Follows a low sodium diet/DASH diet  -patient has a new bp monitor     COMPLETED: I have a bladder infection       Patient Goals/Self Care Activities: -Patient/Caregiver will take medications as prescribed   -Patient/Caregiver will attend all scheduled provider appointments -Patient/Caregiver will call pharmacy for medication refills 3-7 days in advance of running out of medications -Patient/Caregiver will call provider office for new concerns or questions  -Patient/Caregiver will focus on medication adherence by taking medications as prescribed   Evaluation of current treatment plan related to patient's adherence to plan as established by provider Take all of your antibiotics Drink water to flush your system If you start to have worsening pain, nausea and vomiting, high fever, bad abdominal pain. Any worsening or worrisome symptoms please return to the ER for reevaluation         Our next appointment is by telephone on 01/11/23 at 3 pm  Please call the care guide  team at [phone] if you need to cancel or reschedule your appointment.   If you are experiencing a Mental Health or Behavioral Health Crisis or need someone to talk to, please call 1-800-273-TALK (toll free, 24 hour hotline)  Patient verbalizes understanding of instructions and care plan provided today and agrees to view in MyChart. Active MyChart status and patient understanding of how to access instructions and care plan via MyChart confirmed with patient.     Juanell Fairly RN, BSN, [facility] [employer] [employer]/designer Phone: [phone]

## 2022-12-11 NOTE — Patient Outreach (Signed)
Care Coordination   Follow Up Visit Note   12/11/2022 Name: Vanessa Palmer MRN: [id number] DOB: Oct 20, 1950  Vanessa Palmer is a 72 y.o. year old female who sees Vanessa Catalina, MD for primary care. I spoke with  Vanessa Palmer by phone today.  What matters to the patients health and wellness today?  Vanessa Palmer is feeling a bit down. Her sister is currently in a nursing home battling cancer, and it's uncertain how much longer she will be with [name]. However, she mentioned that her therapist is providing in-home assistance with her exercise routine. Vanessa Palmer has also bought a blood pressure monitor, and her readings have been good. On the 10th, her blood pressure was 138/88, and on the 11th, it was 118/64. She mentioned that she's recovered from her urinary tract infections, her medication is working well, and she's been sleeping okay.    Goals Addressed             This Visit's Progress    I am concerned about my blood pressure and my swelling.       Patient Goals/Self Care Activities: -Patient/Caregiver will self-administer medications as prescribed as evidenced by self-report/primary caregiver report  -Patient/Caregiver will attend all scheduled provider appointments as evidenced by clinician review of documented attendance to scheduled appointments and patient/caregiver report -Patient/Caregiver will call provider office for new concerns or questions as evidenced by review of documented incoming telephone call notes and patient report  -Calls provider office for new concerns, questions, or BP outside discussed parameters -Checks BP and records as discussed -Follows a low sodium diet/DASH diet  -patient has a new bp monitor     COMPLETED: I have a bladder infection       Patient Goals/Self Care Activities: -Patient/Caregiver will take medications as prescribed   -Patient/Caregiver will attend all scheduled provider appointments -Patient/Caregiver will call pharmacy for  medication refills 3-7 days in advance of running out of medications -Patient/Caregiver will call provider office for new concerns or questions  -Patient/Caregiver will focus on medication adherence by taking medications as prescribed   Evaluation of current treatment plan related to patient's adherence to plan as established by provider Take all of your antibiotics Drink water to flush your system If you start to have worsening pain, nausea and vomiting, high fever, bad abdominal pain. Any worsening or worrisome symptoms please return to the ER for reevaluation         SDOH assessments and interventions completed:  No     Care Coordination Interventions:  Yes, provided   Interventions Today    Flowsheet Row Most Recent Value  Chronic Disease   Chronic disease during today's visit Hypertension (HTN), Other  [Bladder Infection]  General Interventions   General Interventions Discussed/Reviewed General Interventions Discussed  Pharmacy Interventions   Pharmacy Dicussed/Reviewed Pharmacy Topics Discussed  Safety Interventions   Safety Discussed/Reviewed Safety Discussed        Follow up plan: Follow up call scheduled for 01/11/23  3 pm    Encounter Outcome:  Patient Visit Completed   Vanessa Fairly RN, BSN, [facility]   Care Coordinator Phone: [phone]

## 2022-12-24 ENCOUNTER — Ambulatory Visit (INDEPENDENT_AMBULATORY_CARE_PROVIDER_SITE_OTHER): Payer: 59 | Admitting: Orthopaedic Surgery

## 2022-12-24 VITALS — Ht 62.0 in | Wt 287.7 lb

## 2022-12-24 DIAGNOSIS — G8929 Other chronic pain: Secondary | ICD-10-CM

## 2022-12-24 DIAGNOSIS — M1712 Unilateral primary osteoarthritis, left knee: Secondary | ICD-10-CM | POA: Diagnosis not present

## 2022-12-24 DIAGNOSIS — M1711 Unilateral primary osteoarthritis, right knee: Secondary | ICD-10-CM | POA: Diagnosis not present

## 2022-12-24 DIAGNOSIS — Z6841 Body Mass Index (BMI) 40.0 and over, adult: Secondary | ICD-10-CM

## 2022-12-24 DIAGNOSIS — M25561 Pain in right knee: Secondary | ICD-10-CM | POA: Diagnosis not present

## 2022-12-24 DIAGNOSIS — M25562 Pain in left knee: Secondary | ICD-10-CM | POA: Diagnosis not present

## 2022-12-24 NOTE — Progress Notes (Signed)
The patient is a 72 year old female that we actually replaced her right hip successfully back in 2019.  She was a lot thinner then.  She has gained abundant amount of weight.  Her BMI is now today 52.62.  She has been debilitating pain of both her knees.  X-rays in the last few months of both knees shows severe end-stage arthritis.  She is also on chronic Xarelto due to DVTs.  She has significant comorbidities as well.  Examination of both knees shows significant limitations in range of motion due to the severity of her pain.  There is a large soft tissue envelope around both knees.  I did go over the x-rays of both knees showing the severity of arthritis.  There is really nothing that I have to offer her given her morbid obesity and her comorbidities.  It would not be safe to perform a knee joint replacement surgery on her at this point and her weight is prohibitive.  She understands that as well.  She may end up needing some type of chronic pain management but even injections at this point will not help.

## 2022-12-26 ENCOUNTER — Other Ambulatory Visit: Payer: Self-pay | Admitting: Orthopaedic Surgery

## 2022-12-26 ENCOUNTER — Telehealth: Payer: Self-pay | Admitting: Orthopaedic Surgery

## 2022-12-26 MED ORDER — CYCLOBENZAPRINE HCL 10 MG PO TABS: 10.0000 mg | ORAL_TABLET | Freq: Two times a day (BID) | ORAL | 0 refills | Status: DC | PRN

## 2022-12-26 NOTE — Telephone Encounter (Signed)
Please advise Looks like she takes Flexeril, but it doesn't look like you gave it to her?

## 2022-12-26 NOTE — Telephone Encounter (Signed)
Patient called requesting muscle relaxer filled please.

## 2022-12-27 ENCOUNTER — Other Ambulatory Visit: Payer: Self-pay

## 2022-12-27 DIAGNOSIS — M2550 Pain in unspecified joint: Secondary | ICD-10-CM

## 2022-12-31 MED ORDER — OXYCODONE-ACETAMINOPHEN 10-325 MG PO TABS: 1.0000 | ORAL_TABLET | Freq: Three times a day (TID) | ORAL | 0 refills | Status: DC | PRN

## 2022-12-31 NOTE — Telephone Encounter (Signed)
Pt called the pharmacy and is still unable to pick up the following medication. Pt state she call on 12/27/2022 to refill her medication.   oxyCODONE-acetaminophen Expired - oxyCODONE-acetaminophen (PERCOCET) 10-325 MG tablet   [id number] - [place], [place] - [address] AT [facility] [address] (Ph: [phone])

## 2023-01-08 ENCOUNTER — Telehealth: Payer: Self-pay | Admitting: *Deleted

## 2023-01-08 NOTE — Telephone Encounter (Signed)
Lvm for patient to return call regarding her assessment appointment for [facility] . [phone].

## 2023-01-09 ENCOUNTER — Telehealth: Payer: Self-pay | Admitting: *Deleted

## 2023-01-09 NOTE — Telephone Encounter (Signed)
Patient is to call back and leave information of agency name and phone number for me.

## 2023-01-11 ENCOUNTER — Ambulatory Visit: Payer: Self-pay

## 2023-01-11 NOTE — Patient Outreach (Signed)
  Care Coordination   Follow Up Visit Note   01/11/2023 Name: Vanessa Palmer MRN: [id number] DOB: Jun 13, 1950  Vanessa Palmer is a 72 y.o. year old female who sees Vanessa Catalina, MD for primary care. I spoke with  Vanessa Palmer by phone today.  What matters to the patients health and wellness today?  Vanessa Palmer reports that she is doing quite well. She completed her last physical therapy session today, and her blood pressure readings have been excellent: 108/60 on November 7th and 132/80 on November 11th. She needs to check her blood pressure more frequently. So far, she has experienced no issues such as headaches or chest pain during her physical therapy sessions, which have included walking and seated exercises. The therapist encouraged her to schedule an appointment for aqua therapy, which could be beneficial. She has learned that her knees show bone-on-bone contact, but she cannot undergo surgery until she loses weight. Currently, she weighs 287 pounds. She has an appointment with her primary care physician on November 22nd to discuss some of her concerns. Vanessa Palmer is taking her medication as prescribed, and I will follow up with her next month.    Goals Addressed             This Visit's Progress    I am concerned about my blood pressure and my swelling.       Patient Goals/Self Care Activities: -Patient/Caregiver will self-administer medications as prescribed as evidenced by self-report/primary caregiver report  -Patient/Caregiver will attend all scheduled provider appointments as evidenced by clinician review of documented attendance to scheduled appointments and patient/caregiver report -Patient/Caregiver will call provider office for new concerns or questions as evidenced by review of documented incoming telephone call notes and patient report  -Calls provider office for new concerns, questions, or BP outside discussed parameters -Checks BP and records as  discussed -Follows a low sodium diet/DASH diet  -patient has a new bp monitor -Check blood pressure at least 3 times a week -Monitor your food intake        SDOH assessments and interventions completed:  No     Care Coordination Interventions:  Yes, provided   Interventions Today    Flowsheet Row Most Recent Value  Chronic Disease   Chronic disease during today's visit Hypertension (HTN)  General Interventions   General Interventions Discussed/Reviewed General Interventions Discussed  Exercise Interventions   Exercise Discussed/Reviewed Exercise Discussed  Nutrition Interventions   Nutrition Discussed/Reviewed Nutrition Discussed  Pharmacy Interventions   Pharmacy Dicussed/Reviewed Pharmacy Topics Discussed  Safety Interventions   Safety Discussed/Reviewed Safety Discussed        Follow up plan: Follow up call scheduled for 02/14/23  3 pm    Encounter Outcome:  Patient Visit Completed   Juanell Fairly RN, BSN, [facility]  [facility], [facility]  Care Coordinator Phone: [phone]

## 2023-01-11 NOTE — Patient Instructions (Signed)
Visit Information  Thank you for taking time to visit with me today. Please don't hesitate to contact me if I can be of assistance to you.   Following are the goals we discussed today:   Goals Addressed             This Visit's Progress    I am concerned about my blood pressure and my swelling.       Patient Goals/Self Care Activities: -Patient/Caregiver will self-administer medications as prescribed as evidenced by self-report/primary caregiver report  -Patient/Caregiver will attend all scheduled provider appointments as evidenced by clinician review of documented attendance to scheduled appointments and patient/caregiver report -Patient/Caregiver will call provider office for new concerns or questions as evidenced by review of documented incoming telephone call notes and patient report  -Calls provider office for new concerns, questions, or BP outside discussed parameters -Checks BP and records as discussed -Follows a low sodium diet/DASH diet  -patient has a new bp monitor -Check blood pressure at least 3 times a week -Monitor your food intake        Our next appointment is by telephone on 02/14/23 at 3pm  Please call the care guide team at [phone] if you need to cancel or reschedule your appointment.   If you are experiencing a Mental Health or Behavioral Health Crisis or need someone to talk to, please call 1-800-273-TALK (toll free, 24 hour hotline)  Patient verbalizes understanding of instructions and care plan provided today and agrees to view in MyChart. Active MyChart status and patient understanding of how to access instructions and care plan via MyChart confirmed with patient.     Juanell Fairly RN, BSN, [facility]  [facility], [facility]  Care Coordinator Phone: [phone]

## 2023-01-16 ENCOUNTER — Encounter: Payer: Self-pay | Admitting: *Deleted

## 2023-01-18 ENCOUNTER — Other Ambulatory Visit: Payer: Self-pay

## 2023-01-18 ENCOUNTER — Encounter: Payer: Self-pay | Admitting: Internal Medicine

## 2023-01-18 ENCOUNTER — Ambulatory Visit (INDEPENDENT_AMBULATORY_CARE_PROVIDER_SITE_OTHER): Payer: 59 | Admitting: Internal Medicine

## 2023-01-18 VITALS — BP 141/81 | HR 77 | Temp 98.1°F | Ht 62.0 in | Wt 300.3 lb

## 2023-01-18 DIAGNOSIS — F331 Major depressive disorder, recurrent, moderate: Secondary | ICD-10-CM | POA: Diagnosis not present

## 2023-01-18 DIAGNOSIS — N1832 Chronic kidney disease, stage 3b: Secondary | ICD-10-CM

## 2023-01-18 DIAGNOSIS — I10 Essential (primary) hypertension: Secondary | ICD-10-CM

## 2023-01-18 DIAGNOSIS — M1711 Unilateral primary osteoarthritis, right knee: Secondary | ICD-10-CM

## 2023-01-18 DIAGNOSIS — I129 Hypertensive chronic kidney disease with stage 1 through stage 4 chronic kidney disease, or unspecified chronic kidney disease: Secondary | ICD-10-CM | POA: Diagnosis not present

## 2023-01-18 MED ORDER — SERTRALINE HCL 50 MG PO TABS
100.0000 mg | ORAL_TABLET | Freq: Every day | ORAL | 3 refills | Status: DC
Start: 2023-01-18 — End: 2023-04-19

## 2023-01-18 NOTE — Progress Notes (Signed)
Established Patient Office Visit  Subjective   Patient ID: Vanessa Palmer, female    DOB: [birth year]  Age: 72 y.o. MRN: [id number]  Chief Complaint  Patient presents with   Follow-up     2 month    Hypertension   Hyperlipidemia    Vanessa Palmer is a 72 year old woman with PMH of CAD, LVH, HTN, OSA, Asthma, chronic pain, CKD 3b, MDD, lymphedema, HLD, OA of the knees, UI, h/o DVT who presents for follow up.   Vanessa Palmer reports she is doing relatively well.  She notes that she recently lost a sister and has been more sad and depressed due to this.  She is interested in increasing her sertraline dosing.  She notes that her knees are not doing well.   She went to see her orthopedic surgeon who noted that she would need to lose ~ 100# in order to have surgery.  She is looking into water aerobics at the [facility].  She is requesting a new order for her motorized wheelchair as well.   She notes that the night sweats have resolved.   Her BP was initially high and then just borderline high.  She cannot remember the names of her medications.  She is supposed to be on 5 drug therapy.  At some point, her losartan-hydrochlorothiazide fell off of her list.  She has worsening CKD.  I think she is on the verge of needing to see nephrology.    She has had her colonoscopy done in the last 1-2 years.  We will try to get results forwarded to the clinic.       Review of Systems  Constitutional:  Negative for chills, fever, malaise/fatigue and weight loss.  Respiratory:  Negative for cough and shortness of breath.   Cardiovascular:  Positive for leg swelling (chronic lymphedema). Negative for chest pain and claudication.  Gastrointestinal:  Negative for abdominal pain and heartburn.  Genitourinary:  Negative for dysuria and frequency.  Musculoskeletal:  Positive for back pain and joint pain. Negative for falls.  Neurological:  Negative for dizziness, sensory change, focal weakness and headaches.   Psychiatric/Behavioral:  Positive for depression. The patient is not nervous/anxious.       Objective:     BP (!) 141/81 (BP Location: Left Arm, Cuff Size: Large)   Pulse 77   Temp 98.1 F (36.7 C) (Oral)   Ht 5\' 2"  (1.575 m)   Wt (!) 300 lb 4.8 oz (136.2 kg)   SpO2 97%   BMI 54.93 kg/m    Physical Exam Vitals and nursing note reviewed.  Constitutional:      General: She is not in acute distress.    Appearance: She is obese. She is not toxic-appearing.  HENT:     Head: Normocephalic and atraumatic.  Cardiovascular:     Rate and Rhythm: Normal rate and regular rhythm.     Heart sounds: No murmur heard. Pulmonary:     Effort: Pulmonary effort is normal. No respiratory distress.     Breath sounds: No wheezing, rhonchi or rales.  Musculoskeletal:        General: Swelling present. No tenderness.     Right lower leg: No edema.     Left lower leg: No edema.  Skin:    General: Skin is warm and dry.  Neurological:     General: No focal deficit present.     Mental Status: She is alert and oriented to person, place, and time.  Psychiatric:  Mood and Affect: Mood normal.        Behavior: Behavior normal.          Assessment & Plan:   Problem List Items Addressed This Visit       Unprioritized   Major depressive disorder, recurrent episode, moderate (HCC) (Chronic)    Improved, but now with new grief reaction.  Increase sertraline to 100mg  daily.  Follow up in 2 months.       Relevant Medications   sertraline (ZOLOFT) 50 MG tablet   Essential hypertension (Chronic)    BP mildly elevated today.  She is on amlodipine, losartan-hydrochlorothiazide, hydralazine, metoprolol.   Plan Check BMET, if renal function the same or worse, refer to nephrology I have asked her to bring in all of her medications at next visit.       Relevant Medications   losartan-hydrochlorothiazide (HYZAAR) 50-12.5 MG tablet   Chronic kidney disease (CKD), stage III (moderate)  (HCC) - Primary (Chronic)    I am concerned she is developing worsening renal function, particularly with her BP being worse.   Plan Check BMET today If renal function remains < 30 for GFR, will refer to nephrology.       Relevant Orders   BMP8+Anion Gap   Unilateral primary osteoarthritis, right knee    She was asking for increased pain medication today.  I advised her that she is on the max dose we would be able to give her.  She is only able to take gabapentin at night due to drowsiness.  I think she would do best by losing weight.  She may need to see weight management if she is not able to lose weight with water aerobics.   Plan Encouraged exercise and weight loss.  PDMP is appropriate Pain contract on file Continue percocet at current dose.        Return in about 2 months (around 03/20/2023).    [name], MD

## 2023-01-18 NOTE — Assessment & Plan Note (Addendum)
She was asking for increased pain medication today.  I advised her that she is on the max dose we would be able to give her.  She is only able to take gabapentin at night due to drowsiness.  I think she would do best by losing weight.  She may need to see weight management if she is not able to lose weight with water aerobics.   Plan Encouraged exercise and weight loss.  PDMP is appropriate Pain contract on file Continue percocet at current dose.

## 2023-01-18 NOTE — Assessment & Plan Note (Signed)
I am concerned she is developing worsening renal function, particularly with her BP being worse.   Plan Check BMET today If renal function remains < 30 for GFR, will refer to nephrology.

## 2023-01-18 NOTE — Patient Instructions (Signed)
Ms. Spake - -  For your knee pain, please start water aerobics.  You may be able to qualify for a weight loss medication as well, given your need for knee surgery.  We can discuss this further at next visit.   For your pain, please continue to take your medications as you have been.   For your recent grief and depression, please INCREASE your sertraline to 100mg  daily.   Please bring in all your medications at next visit for [name] to review.   Thank you!  Come back to see me in 2 months, sooner if needed

## 2023-01-18 NOTE — Assessment & Plan Note (Signed)
Improved, but now with new grief reaction.  Increase sertraline to 100mg  daily.  Follow up in 2 months.

## 2023-01-18 NOTE — Assessment & Plan Note (Signed)
>>  ASSESSMENT AND PLAN FOR UNILATERAL PRIMARY OSTEOARTHRITIS, RIGHT KNEE WRITTEN ON 01/18/2023  2:29 PM BY MULLEN, [name], MD  She was asking for increased pain medication today.  I advised her that she is on the max dose we would be able to give her.  She is only able to take gabapentin  at night due to drowsiness.  I think she would do best by losing weight.  She may need to see weight management if she is not able to lose weight with water aerobics.   Plan Encouraged exercise and weight loss.  PDMP is appropriate Pain contract on file Continue percocet at current dose.

## 2023-01-18 NOTE — Assessment & Plan Note (Signed)
BP mildly elevated today.  She is on amlodipine, losartan-hydrochlorothiazide, hydralazine, metoprolol.   Plan Check BMET, if renal function the same or worse, refer to nephrology I have asked her to bring in all of her medications at next visit.

## 2023-01-21 LAB — BMP8+ANION GAP
Anion Gap: 14 mmol/L (ref 10.0–18.0)
BUN/Creatinine Ratio: 17 (ref 12–28)
BUN: 26 mg/dL (ref 8–27)
CO2: 23 mmol/L (ref 20–29)
Calcium: 9.1 mg/dL (ref 8.7–10.3)
Chloride: 102 mmol/L (ref 96–106)
Creatinine, Ser: 1.55 mg/dL — ABNORMAL HIGH (ref 0.57–1.00)
Glucose: 99 mg/dL (ref 70–99)
Potassium: 4.3 mmol/L (ref 3.5–5.2)
Sodium: 139 mmol/L (ref 134–144)
eGFR: 36 mL/min/{1.73_m2} — ABNORMAL LOW (ref >59)

## 2023-01-28 ENCOUNTER — Other Ambulatory Visit: Payer: Self-pay

## 2023-01-28 DIAGNOSIS — M2550 Pain in unspecified joint: Secondary | ICD-10-CM

## 2023-01-29 MED ORDER — OXYCODONE-ACETAMINOPHEN 10-325 MG PO TABS: 1.0000 | ORAL_TABLET | Freq: Three times a day (TID) | ORAL | 0 refills | Status: DC | PRN

## 2023-01-31 ENCOUNTER — Emergency Department (HOSPITAL_BASED_OUTPATIENT_CLINIC_OR_DEPARTMENT_OTHER)
Admission: EM | Admit: 2023-01-31 | Discharge: 2023-01-31 | Disposition: A | Discharge status: Home / Self Care | Payer: 59 | Attending: Emergency Medicine | Admitting: Emergency Medicine

## 2023-01-31 ENCOUNTER — Other Ambulatory Visit: Payer: Self-pay

## 2023-01-31 ENCOUNTER — Emergency Department (HOSPITAL_BASED_OUTPATIENT_CLINIC_OR_DEPARTMENT_OTHER): Payer: 59 | Admitting: Radiology

## 2023-01-31 ENCOUNTER — Encounter (HOSPITAL_BASED_OUTPATIENT_CLINIC_OR_DEPARTMENT_OTHER): Payer: Self-pay

## 2023-01-31 DIAGNOSIS — D649 Anemia, unspecified: Secondary | ICD-10-CM | POA: Insufficient documentation

## 2023-01-31 DIAGNOSIS — R12 Heartburn: Secondary | ICD-10-CM | POA: Diagnosis present

## 2023-01-31 DIAGNOSIS — K219 Gastro-esophageal reflux disease without esophagitis: Secondary | ICD-10-CM | POA: Insufficient documentation

## 2023-01-31 DIAGNOSIS — I129 Hypertensive chronic kidney disease with stage 1 through stage 4 chronic kidney disease, or unspecified chronic kidney disease: Secondary | ICD-10-CM | POA: Insufficient documentation

## 2023-01-31 DIAGNOSIS — N189 Chronic kidney disease, unspecified: Secondary | ICD-10-CM | POA: Diagnosis not present

## 2023-01-31 DIAGNOSIS — Z91010 Allergy to peanuts: Secondary | ICD-10-CM | POA: Diagnosis not present

## 2023-01-31 DIAGNOSIS — R7989 Other specified abnormal findings of blood chemistry: Secondary | ICD-10-CM | POA: Diagnosis not present

## 2023-01-31 DIAGNOSIS — Z7901 Long term (current) use of anticoagulants: Secondary | ICD-10-CM | POA: Diagnosis not present

## 2023-01-31 DIAGNOSIS — J45909 Unspecified asthma, uncomplicated: Secondary | ICD-10-CM | POA: Insufficient documentation

## 2023-01-31 DIAGNOSIS — I10 Essential (primary) hypertension: Secondary | ICD-10-CM

## 2023-01-31 DIAGNOSIS — Z7982 Long term (current) use of aspirin: Secondary | ICD-10-CM | POA: Diagnosis not present

## 2023-01-31 DIAGNOSIS — Z79899 Other long term (current) drug therapy: Secondary | ICD-10-CM | POA: Diagnosis not present

## 2023-01-31 LAB — BASIC METABOLIC PANEL
Anion gap: 9 (ref 5–15)
BUN: 34 mg/dL — ABNORMAL HIGH (ref 8–23)
CO2: 28 mmol/L (ref 22–32)
Calcium: 9.5 mg/dL (ref 8.9–10.3)
Chloride: 99 mmol/L (ref 98–111)
Creatinine, Ser: 1.65 mg/dL — ABNORMAL HIGH (ref 0.44–1.00)
GFR, Estimated: 33 mL/min — ABNORMAL LOW (ref >60)
Glucose, Bld: 109 mg/dL — ABNORMAL HIGH (ref 70–99)
Potassium: 4.1 mmol/L (ref 3.5–5.1)
Sodium: 136 mmol/L (ref 135–145)

## 2023-01-31 LAB — CBC
HCT: 35.1 % — ABNORMAL LOW (ref 36.0–46.0)
Hemoglobin: 11.7 g/dL — ABNORMAL LOW (ref 12.0–15.0)
MCH: 29.7 pg (ref 26.0–34.0)
MCHC: 33.3 g/dL (ref 30.0–36.0)
MCV: 89.1 fL (ref 80.0–100.0)
Platelets: 192 10*3/uL (ref 150–400)
RBC: 3.94 MIL/uL (ref 3.87–5.11)
RDW: 13.3 % (ref 11.5–15.5)
WBC: 5.9 10*3/uL (ref 4.0–10.5)
nRBC: 0 % (ref 0.0–0.2)

## 2023-01-31 LAB — TROPONIN I (HIGH SENSITIVITY): Troponin I (High Sensitivity): 12 ng/L (ref <18)

## 2023-01-31 LAB — BRAIN NATRIURETIC PEPTIDE: B Natriuretic Peptide: 34.2 pg/mL (ref 0.0–100.0)

## 2023-01-31 MED ORDER — PANTOPRAZOLE SODIUM 40 MG IV SOLR
40.0000 mg | Freq: Once | INTRAVENOUS | Status: AC
  Administered 2023-01-31: 40 mg via INTRAVENOUS
  Filled 2023-01-31: qty 10

## 2023-01-31 MED ORDER — SUCRALFATE 1 G PO TABS: 1.0000 g | ORAL_TABLET | Freq: Three times a day (TID) | ORAL | 0 refills | Status: DC

## 2023-01-31 MED ORDER — PANTOPRAZOLE SODIUM 40 MG PO TBEC: 40.0000 mg | DELAYED_RELEASE_TABLET | Freq: Every day | ORAL | 1 refills | Status: DC

## 2023-01-31 MED ORDER — MORPHINE SULFATE (PF) 4 MG/ML IV SOLN
4.0000 mg | Freq: Once | INTRAVENOUS | Status: AC
  Administered 2023-01-31: 4 mg via INTRAVENOUS
  Filled 2023-01-31: qty 1

## 2023-01-31 MED ORDER — ALUM & MAG HYDROXIDE-SIMETH 200-200-20 MG/5ML PO SUSP
30.0000 mL | Freq: Once | ORAL | Status: AC
  Administered 2023-01-31: 30 mL via ORAL
  Filled 2023-01-31: qty 30

## 2023-01-31 MED ORDER — LIDOCAINE VISCOUS HCL 2 % MT SOLN
15.0000 mL | Freq: Once | OROMUCOSAL | Status: AC
  Administered 2023-01-31: 15 mL via ORAL
  Filled 2023-01-31: qty 15

## 2023-01-31 NOTE — ED Notes (Signed)
RT went to assess the Pt and she was in the restroom. Pt used the call bell and RT went to help her out of the restroom. RT listened to the Pt's lungs and they were clear in all bases. Pt stated that it felt like heart burn.

## 2023-01-31 NOTE — ED Provider Notes (Signed)
[place] EMERGENCY DEPARTMENT AT [facility] Provider Note   CSN: [id number] Arrival date & time: 01/31/23  1221     History  Chief Complaint  Patient presents with   Chest Pain    Vanessa Palmer is a 72 y.o. female hyperlipidemia, gout, depression, hypertension, asthma, CKD, LVH, sleep apnea, history of alcohol abuse who presents with concern for heartburn, feeling like something might be stuck in her throat.  She reports onset around noon yesterday.  She denies any nausea, vomiting.  She does report that she is tolerating her own saliva without difficulty.  She has not tried anything else for the pain.  She denies shortness of breath on my exam although she did initially endorse some in triage.   Chest Pain      Home Medications Prior to Admission medications   Medication Sig Start Date End Date Taking? Authorizing Provider  amLODipine (NORVASC) 10 MG tablet Take 1 tablet (10 mg total) by mouth daily. 11/14/22  Yes Inez Catalina, MD  aspirin EC 81 MG tablet Take 81 mg by mouth daily. Swallow whole.   Yes [provider]  atorvastatin (LIPITOR) 40 MG tablet Take 1 tablet (40 mg total) by mouth daily. 11/14/22  Yes Inez Catalina, MD  budesonide-formoterol [facility]) 160-4.5 MCG/ACT inhaler Inhale 2 puffs into the lungs 3 times/day as needed-between meals & bedtime. 07/27/22  Yes Inez Catalina, MD  cyclobenzaprine (FLEXERIL) 10 MG tablet Take 1 tablet (10 mg total) by mouth 2 (two) times daily as needed for muscle spasms. 12/26/22  Yes Kathryne Hitch, MD  EPINEPHrine 0.3 mg/0.3 mL IJ SOAJ injection Inject 0.3 mg into the muscle as needed for anaphylaxis. 10/05/22  Yes Inez Catalina, MD  gabapentin (NEURONTIN) 300 MG capsule Take 1 capsule (300 mg total) by mouth 3 (three) times daily. 11/14/22  Yes Inez Catalina, MD  hydrALAZINE (APRESOLINE) 50 MG tablet Take 2 tablets (100 mg total) by mouth in the morning and at bedtime. 10/05/22  Yes Inez Catalina, MD  hydrOXYzine (ATARAX) 25 MG tablet Take 25 mg by mouth every 6 (six) hours as needed for itching.   Yes [provider]  losartan-hydrochlorothiazide (HYZAAR) 50-12.5 MG tablet Take 1 tablet by mouth daily. 11/21/22  Yes [provider]  metoprolol succinate (TOPROL-XL) 50 MG 24 hr tablet TAKE 1 TABLET(50 MG) BY MOUTH DAILY 08/22/22  Yes Inez Catalina, MD  nitroGLYCERIN (NITROSTAT) 0.4 MG SL tablet Place 1 tablet (0.4 mg total) under the tongue every 5 (five) minutes as needed for chest pain. 03/11/20  Yes Inez Catalina, MD  oxyCODONE-acetaminophen (PERCOCET) 10-325 MG tablet Take 1 tablet by mouth every 8 (eight) hours as needed for pain (chronic arthritis pain, multiple sites, diag code M16.11, M25.50). 01/29/23 02/28/23 Yes Gust Rung, DO  pantoprazole (PROTONIX) 40 MG tablet Take 1 tablet (40 mg total) by mouth daily. 01/31/23  Yes Teryl Gubler H, PA-C  potassium chloride SA (KLOR-CON M) 20 MEQ tablet Take 20 mEq by mouth daily. 09/27/22  Yes [provider]  rivaroxaban (XARELTO) 20 MG TABS tablet TAKE ONE TABLET BY MOUTH DAILY WITH SUPPER 09/04/22  Yes Marrianne Mood, MD  sertraline (ZOLOFT) 50 MG tablet Take 2 tablets (100 mg total) by mouth at bedtime. 01/18/23  Yes Inez Catalina, MD  sucralfate (CARAFATE) 1 g tablet Take 1 tablet (1 g total) by mouth 4 (four) times daily -  with meals and at bedtime. 01/31/23  Yes  Tyisha Cressy H, PA-C  tolterodine (DETROL LA) 2 MG 24 hr capsule TAKE 1 CAPSULE(2 MG) BY MOUTH DAILY 08/22/22  Yes Inez Catalina, MD  torsemide (DEMADEX) 20 MG tablet Take 20 mg by mouth 2 (two) times daily. 09/27/22  Yes [provider]      Allergies    Ace inhibitors, Other, Regadenoson, Shrimp [shellfish allergy], Peanut oil, Hydrocodone-acetaminophen, Hydromorphone hcl, Oxycodone-acetaminophen, Propoxyphene n-acetaminophen, and Vicodin [hydrocodone-acetaminophen]    Review of Systems   Review of Systems   Cardiovascular:  Positive for chest pain.  All other systems reviewed and are negative.   Physical Exam Updated Vital Signs BP (!) 200/101 (BP Location: Left Arm)   Pulse 71   Temp 98.3 F (36.8 C) (Oral)   Resp 13   Ht 5\' 2"  (1.575 m)   Wt (!) 136.2 kg   SpO2 94%   BMI 54.93 kg/m  Physical Exam Vitals and nursing note reviewed.  Constitutional:      General: She is not in acute distress.    Appearance: Normal appearance. She is obese.  HENT:     Head: Normocephalic and atraumatic.  Eyes:     General:        Right eye: No discharge.        Left eye: No discharge.  Cardiovascular:     Rate and Rhythm: Normal rate and regular rhythm.     Heart sounds: No murmur heard.    No friction rub. No gallop.  Pulmonary:     Effort: Pulmonary effort is normal.     Breath sounds: Normal breath sounds.     Comments: No wheezing, rhonchi, stridor, rales Abdominal:     General: Bowel sounds are normal.     Palpations: Abdomen is soft.  Skin:    General: Skin is warm and dry.     Capillary Refill: Capillary refill takes less than 2 seconds.  Neurological:     Mental Status: She is alert and oriented to person, place, and time.  Psychiatric:        Mood and Affect: Mood normal.        Behavior: Behavior normal.     ED Results / Procedures / Treatments   Labs (all labs ordered are listed, but only abnormal results are displayed) Labs Reviewed  BASIC METABOLIC PANEL - Abnormal; Notable for the following components:      Result Value   Glucose, Bld 109 (*)    BUN 34 (*)    Creatinine, Ser 1.65 (*)    GFR, Estimated 33 (*)    All other components within normal limits  CBC - Abnormal; Notable for the following components:   Hemoglobin 11.7 (*)    HCT 35.1 (*)    All other components within normal limits  BRAIN NATRIURETIC PEPTIDE  TROPONIN I (HIGH SENSITIVITY)    EKG EKG Interpretation Date/Time:  Thursday January 31 2023 12:43:38 EST Ventricular Rate:  71 PR  Interval:  178 QRS Duration:  76 QT Interval:  424 QTC Calculation: 460 R Axis:   19  Text Interpretation: Normal sinus rhythm with sinus arrhythmia Normal ECG When compared with ECG of 28-Jun-2022 22:22, PREVIOUS ECG IS PRESENT No significant change since last tracing Confirmed by Derwood Kaplan [phone]) on 01/31/2023 2:59:00 PM  Radiology No results found.  Procedures Procedures    Medications Ordered in ED Medications  alum & mag hydroxide-simeth (MAALOX/MYLANTA) 200-200-20 MG/5ML suspension 30 mL (30 mLs Oral Given 01/31/23 1422)    And  lidocaine (XYLOCAINE) 2 % viscous mouth solution 15 mL (15 mLs Oral Given 01/31/23 1423)  pantoprazole (PROTONIX) injection 40 mg (40 mg Intravenous Given 01/31/23 1423)  morphine (PF) 4 MG/ML injection 4 mg (4 mg Intravenous Given 01/31/23 1423)    ED Course/ Medical Decision Making/ A&P                                 Medical Decision Making Amount and/or Complexity of Data Reviewed Labs: ordered. Radiology: ordered.  Risk OTC drugs. Prescription drug management.   This patient is a 72 y.o. female  who presents to the ED for concern of heartburn / chest pain.   Differential diagnoses prior to evaluation: The emergent differential diagnosis includes, but is not limited to,  ACS, AAS, PE, Mallory-Weiss, Boerhaave's, Pneumonia, acute bronchitis, asthma or COPD exacerbation, anxiety, MSK pain or traumatic injury to the chest, acid reflux versus other . This is not an exhaustive differential.   Past Medical History / Co-morbidities / Social History: hyperlipidemia, gout, depression, hypertension, asthma, CKD, LVH, sleep apnea, history of alcohol abuse  Additional history: Chart reviewed. Pertinent results include: Reviewed previous echo, heart cath, cath most recently in 2018 showed no severe stenosis or plaque requiring stenting at that time  Physical Exam: Physical exam performed. The pertinent findings include: Patient is obese, she  does have poorly controlled hypertension, blood pressure 200/101 at time of discharge but chest pain-free  Lab Tests/Imaging studies: I personally interpreted labs/imaging and the pertinent results include: CBC with mild anemia, hemoglobin 11.7.  Troponin is flat at 12 with greater than 6 hours of chest discomfort, chest pain-free at time of discharge, BNP unremarkable, BMP notable for elevated creatinine 1.65, BUN 34, but not significantly changed from her baseline.  I independently interpreted plain film chest x-ray shows no evidence of acute cardiac or pulmonary disease.  I agree with the radiologist interpretation.  Cardiac monitoring: EKG obtained and interpreted by myself and attending physician which shows: Normal sinus rhythm, sinus arrhythmia, no significant change from baseline   Medications: I ordered medication including morphine, GI cocktail, Protonix, patient reports symptoms improved on reevaluation, will discharge with Protonix, Carafate, GI follow-up, I do think that her symptoms are consistent with GERD versus esophagitis, no evidence of food impaction, she is tolerating secretions, she is chest pain-free at time of discharge, she continues to have poorly controlled hypertension but encourage close cardiology/PCP follow-up.  I have reviewed the patients home medicines and have made adjustments as needed.   Disposition: After consideration of the diagnostic results and the patients response to treatment, I feel that patient is stable for discharge with plan as above.   emergency department workup does not suggest an emergent condition requiring admission or immediate intervention beyond what has been performed at this time. The plan is: as above. The patient is safe for discharge and has been instructed to return immediately for worsening symptoms, change in symptoms or any other concerns.  Final Clinical Impression(s) / ED Diagnoses Final diagnoses:  Gastroesophageal reflux  disease, unspecified whether esophagitis present  Poorly-controlled hypertension    Rx / DC Orders ED Discharge Orders          Ordered    pantoprazole (PROTONIX) 40 MG tablet  Daily        01/31/23 1522    sucralfate (CARAFATE) 1 g tablet  3 times daily with meals & bedtime  01/31/23 1522              Olene Floss, PA-C 01/31/23 1526    Derwood Kaplan, MD 02/01/23 [phone]

## 2023-01-31 NOTE — ED Triage Notes (Signed)
In for eval of heart burn and feels like something is stuck in her throat. Onset yesterday around noon. Reports took tums without relief and drank coke to make herself belch. Intermittent nausea, denies vomiting.

## 2023-01-31 NOTE — ED Notes (Signed)
Pt given discharge instructions and reviewed prescriptions. Opportunities given for questions. Pt verbalizes understanding. PIV removed x1. Stone,Heather R, RN 

## 2023-01-31 NOTE — Discharge Instructions (Addendum)
Please follow-up with the GI doctor whose contact formation provided as well as your primary care doctor.  If you begin having worsening pain despite the medications that I prescribed I recommend that you follow-up for further evaluation and management.  I recommend that you take the Protonix medication daily, you can take the Carafate medication up to 4 times daily but you do not have to use it all the time, if you do take this medication I would take it with meals and at bedtime.

## 2023-02-01 ENCOUNTER — Telehealth: Payer: Self-pay

## 2023-02-01 NOTE — Transitions of Care (Post Inpatient/ED Visit) (Unsigned)
   02/01/2023  Name: Vanessa Palmer MRN: [id number] DOB: 1950-10-28  Today's TOC FU Call Status: Today's TOC FU Call Status:: Unsuccessful Call (1st Attempt) Unsuccessful Call (1st Attempt) Date: 02/01/23  Attempted to reach the patient regarding the most recent Inpatient/ED visit.  Follow Up Plan: Additional outreach attempts will be made to reach the patient to complete the Transitions of Care (Post Inpatient/ED visit) call.   Signature Karena Addison, LPN [facility] Nurse Health Advisor Direct Dial [phone]

## 2023-02-05 NOTE — Transitions of Care (Post Inpatient/ED Visit) (Signed)
02/05/2023  Name: Vanessa Palmer MRN: [id number] DOB: Mar 19, 1950  Today's TOC FU Call Status: Today's TOC FU Call Status:: Successful TOC FU Call Completed Unsuccessful Call (1st Attempt) Date: 02/01/23 [facility] FU Call Complete Date: 02/05/23 Patient's Name and Date of Birth confirmed.  Transition Care Management Follow-up Telephone Call Date of Discharge: 01/31/23 Discharge Facility: [facility] ([facility]) Type of Discharge: Emergency Department Reason for ED Visit: Other: (hypertension) How have you been since you were released from the hospital?: Better Any questions or concerns?: No  Items Reviewed: Did you receive and understand the discharge instructions provided?: Yes Medications obtained,verified, and reconciled?: Yes (Medications Reviewed) Any new allergies since your discharge?: No Dietary orders reviewed?: Yes Do you have support at home?: No  Medications Reviewed Today: Medications Reviewed Today     Reviewed by Karena Addison, LPN (Licensed Practical Nurse) on 02/05/23 at 1510  Med List Status: <None>   Medication Order Taking? Sig Documenting Provider Last Dose Status Informant  amLODipine (NORVASC) 10 MG tablet [id number] No Take 1 tablet (10 mg total) by mouth daily. Inez Catalina, MD 01/30/2023 Active   aspirin EC 81 MG tablet [id number] No Take 81 mg by mouth daily. Swallow whole. [provider] 01/30/2023 Active Self, Family Member, Pharmacy Records  atorvastatin (LIPITOR) 40 MG tablet [id number] No Take 1 tablet (40 mg total) by mouth daily. Inez Catalina, MD 01/30/2023 Active   budesonide-formoterol (SYMBICORT) 160-4.5 MCG/ACT inhaler [id number] No Inhale 2 puffs into the lungs 3 times/day as needed-between meals & bedtime. Inez Catalina, MD 01/30/2023 Active   cyclobenzaprine (FLEXERIL) 10 MG tablet [id number] No Take 1 tablet (10 mg total) by mouth 2 (two) times daily as needed for muscle spasms. Kathryne Hitch, MD 01/30/2023 Active    EPINEPHrine 0.3 mg/0.3 mL IJ SOAJ injection [id number]  Inject 0.3 mg into the muscle as needed for anaphylaxis. Inez Catalina, MD  Active            Med Note Leia Alf, [facility]   Thu Jan 31, 2023 12:53 PM) prn  gabapentin (NEURONTIN) 300 MG capsule [id number] No Take 1 capsule (300 mg total) by mouth 3 (three) times daily. Inez Catalina, MD 01/30/2023 Active   hydrALAZINE (APRESOLINE) 50 MG tablet [id number] No Take 2 tablets (100 mg total) by mouth in the morning and at bedtime. Inez Catalina, MD 01/30/2023 Active   hydrOXYzine (ATARAX) 25 MG tablet [id number] No Take 25 mg by mouth every 6 (six) hours as needed for itching. [provider] Past Week Active   losartan-hydrochlorothiazide (HYZAAR) 50-12.5 MG tablet [id number] No Take 1 tablet by mouth daily. [provider] 01/30/2023 Active   metoprolol succinate (TOPROL-XL) 50 MG 24 hr tablet [id number] No TAKE 1 TABLET(50 MG) BY MOUTH DAILY Inez Catalina, MD 01/30/2023 Active   nitroGLYCERIN (NITROSTAT) 0.4 MG SL tablet [id number] No Place 1 tablet (0.4 mg total) under the tongue every 5 (five) minutes as needed for chest pain. Inez Catalina, MD Taking Active Self, Family Member, Pharmacy Records           Med Note Leia Alf, [facility]   Thu Jan 31, 2023 12:54 PM) Out of med  oxyCODONE-acetaminophen [facility]) 10-325 MG tablet [id number] No Take 1 tablet by mouth every 8 (eight) hours as needed for pain (chronic arthritis pain, multiple sites, diag code M16.11, M25.50). Gust Rung, DO 01/30/2023 Active   pantoprazole (PROTONIX) 40 MG tablet [id number]  Take 1 tablet (40 mg total) by mouth daily. Prosperi, [employer]  H, PA-C  Active   potassium chloride SA (KLOR-CON M) 20 MEQ tablet [id number]  Take 20 mEq by mouth daily. [provider]  Active            Med Note Ila Mcgill   Thu Jan 31, 2023 12:55 PM) Out of med  rivaroxaban (XARELTO) 20 MG TABS tablet [id number] No TAKE ONE TABLET BY MOUTH DAILY WITH SUPPER Marrianne Mood, MD 01/30/2023 Active   sertraline (ZOLOFT) 50 MG tablet [id number] No Take 2 tablets (100 mg total) by mouth at bedtime. Inez Catalina, MD 01/30/2023 Active   sucralfate (CARAFATE) 1 g tablet [id number]  Take 1 tablet (1 g total) by mouth 4 (four) times daily -  with meals and at bedtime. Prosperi, Christian H, PA-C  Active   tolterodine (DETROL LA) 2 MG 24 hr capsule [id number] No TAKE 1 CAPSULE(2 MG) BY MOUTH DAILY Inez Catalina, MD 01/30/2023 Active   torsemide (DEMADEX) 20 MG tablet [id number] No Take 20 mg by mouth 2 (two) times daily. [provider] 01/30/2023 Active             Home Care and Equipment/Supplies: Were Home Health Services Ordered?: NA Any new equipment or medical supplies ordered?: NA  Functional Questionnaire: Do you need assistance with bathing/showering or dressing?: No Do you need assistance with meal preparation?: No Do you need assistance with eating?: No Do you have difficulty maintaining continence: No Do you need assistance with getting out of bed/getting out of a chair/moving?: No Do you have difficulty managing or taking your medications?: No  Follow up appointments reviewed: PCP Follow-up appointment confirmed?: No (no avail appts, sent message to staff to schedule) MD Provider Line Number:[phone] Given: No Specialist Hospital Follow-up appointment confirmed?: NA Do you need transportation to your follow-up appointment?: No Do you understand care options if your condition(s) worsen?: Yes-patient verbalized understanding    SIGNATURE Karena Addison, LPN [facility] Nurse Health Advisor Direct Dial [phone]

## 2023-02-07 ENCOUNTER — Telehealth: Payer: Self-pay | Admitting: Internal Medicine

## 2023-02-07 DIAGNOSIS — M2550 Pain in unspecified joint: Secondary | ICD-10-CM

## 2023-02-07 NOTE — Telephone Encounter (Signed)
Pt was seen on 01/18/2023 and was to have a referral Placed for New PWC.  Can a new referral be placed to Neuro rehab.  It has now been over 5 years and she can now start the process for a new one?

## 2023-02-07 NOTE — Telephone Encounter (Signed)
Order placed

## 2023-02-14 ENCOUNTER — Ambulatory Visit: Payer: Self-pay

## 2023-02-14 NOTE — Patient Instructions (Signed)
Visit Information  Thank you for taking time to visit with me today. Please don't hesitate to contact me if I can be of assistance to you.   Following are the goals we discussed today:   Goals Addressed             This Visit's Progress    I am concerned about my blood pressure and my swelling.       Patient Goals/Self Care Activities: -Patient/Caregiver will self-administer medications as prescribed as evidenced by self-report/primary caregiver report  -Patient/Caregiver will attend all scheduled provider appointments as evidenced by clinician review of documented attendance to scheduled appointments and patient/caregiver report -Patient/Caregiver will call provider office for new concerns or questions as evidenced by review of documented incoming telephone call notes and patient report  -Calls provider office for new concerns, questions, or BP outside discussed parameters -Checks BP and records as discussed -Follows a low sodium diet/DASH diet  -patient has a new bp monitor -Check blood pressure at least 3 times a week -Monitor your food intake - we have reviewed BEFAST, remember the signs and symptoms of a stroke -elevate legs -maintain exercise -ensure hydration        Our next appointment is by telephone on 03/27/23 at 3 pm  Please call the care guide team at [phone] if you need to cancel or reschedule your appointment.   If you are experiencing a Mental Health or Behavioral Health Crisis or need someone to talk to, please call 1-800-273-TALK (toll free, 24 hour hotline)  Patient verbalizes understanding of instructions and care plan provided today and agrees to view in MyChart. Active MyChart status and patient understanding of how to access instructions and care plan via MyChart confirmed with patient.     Juanell Fairly RN, BSN, [facility] [place]  [facility], [facility]  Care Coordinator Phone: [phone]

## 2023-02-14 NOTE — Patient Outreach (Signed)
  Care Coordination   Follow Up Visit Note   02/14/2023 Name: SHANTEY TOCK MRN: [id number] DOB: [birth year]  Joseph Pierini Orey is a 72 y.o. year old female who sees Gust Rung, DO for primary care. I spoke with  [name] by phone today.  What matters to the patients health and wellness today?  I had a discussion with Mrs. Consuella Lose, during which she expressed that she is currently feeling well. It is noteworthy that she visited the emergency room on December 5th due to issues associated with gastroesophageal reflux. Mrs. Panetti reported experiencing a sensation of fullness in her throat accompanied by difficulty breathing. Although the condition was identified as reflux, her blood pressure was alarmingly elevated at 200/101 mmHg. I advised her that this reading is concerning and underscored the necessity of ongoing blood pressure monitoring. Mrs. Weeda indicated that she believes her blood pressure may be lower now. As part of her treatment plan, she was prescribed Protonix at a dosage of 40 milligrams to be taken once daily and Carafate at a dosage of 1 gram to be administered three times daily with meals and at bedtime. I recommended that she elevate her legs, maintain her exercise regimen, ensure adequate hydration, and monitor her dietary intake. Furthermore, I suggested that she review the acronym "Befast" in order to recognize the signs and symptoms indicative of a stroke.    Goals Addressed             This Visit's Progress    I am concerned about my blood pressure and my swelling.       Patient Goals/Self Care Activities: -Patient/Caregiver will self-administer medications as prescribed as evidenced by self-report/primary caregiver report  -Patient/Caregiver will attend all scheduled provider appointments as evidenced by clinician review of documented attendance to scheduled appointments and patient/caregiver report -Patient/Caregiver will call provider office for  new concerns or questions as evidenced by review of documented incoming telephone call notes and patient report  -Calls provider office for new concerns, questions, or BP outside discussed parameters -Checks BP and records as discussed -Follows a low sodium diet/DASH diet  -patient has a new bp monitor -Check blood pressure at least 3 times a week -Monitor your food intake - we have reviewed BEFAST, remember the signs and symptoms of a stroke        SDOH assessments and interventions completed:  No     Care Coordination Interventions:  Yes, provided    Interventions Today    Flowsheet Row Most Recent Value  Chronic Disease   Chronic disease during today's visit Hypertension (HTN)  General Interventions   General Interventions Discussed/Reviewed General Interventions Discussed  Exercise Interventions   Exercise Discussed/Reviewed Exercise Discussed  Education Interventions   Education Provided Provided Education  Provided Verbal Education On Other  [BEFAST]  Nutrition Interventions   Nutrition Discussed/Reviewed Nutrition Discussed  Pharmacy Interventions   Pharmacy Dicussed/Reviewed Pharmacy Topics Discussed  Safety Interventions   Safety Discussed/Reviewed Safety Discussed        Follow up plan: Follow up call scheduled for 03/27/23  3 pm    Encounter Outcome:  Patient Visit Completed   Juanell Fairly RN, BSN, [facility] [place]  [facility], [facility]  Care Coordinator Phone: [phone]

## 2023-02-22 ENCOUNTER — Telehealth: Payer: Self-pay | Admitting: Orthopaedic Surgery

## 2023-02-22 NOTE — Telephone Encounter (Signed)
Pt called requesting muscle relaxer refill. Please send to [facility] on [address]. Pt phone number is [phone].

## 2023-03-01 ENCOUNTER — Other Ambulatory Visit: Payer: Self-pay

## 2023-03-01 ENCOUNTER — Ambulatory Visit: Payer: 59 | Attending: Internal Medicine

## 2023-03-01 DIAGNOSIS — M6281 Muscle weakness (generalized): Secondary | ICD-10-CM | POA: Diagnosis not present

## 2023-03-01 DIAGNOSIS — R293 Abnormal posture: Secondary | ICD-10-CM | POA: Insufficient documentation

## 2023-03-01 DIAGNOSIS — R262 Difficulty in walking, not elsewhere classified: Secondary | ICD-10-CM | POA: Insufficient documentation

## 2023-03-01 DIAGNOSIS — R2689 Other abnormalities of gait and mobility: Secondary | ICD-10-CM | POA: Insufficient documentation

## 2023-03-01 DIAGNOSIS — M2550 Pain in unspecified joint: Secondary | ICD-10-CM

## 2023-03-01 MED ORDER — OXYCODONE-ACETAMINOPHEN 10-325 MG PO TABS: 1.0000 | ORAL_TABLET | Freq: Three times a day (TID) | ORAL | 0 refills | Status: DC | PRN

## 2023-03-01 NOTE — Therapy (Addendum)
 OUTPATIENT PHYSICAL THERAPY WHEELCHAIR EVALUATION/ UPDATE TO JUSTIFY TILT   Patient Name: Vanessa Palmer MRN: [id number] DOB:May 26, 1950, 73 y.o., female Today's Date: 03/01/2023  END OF SESSION:  PT End of Session - 03/01/23 0936     Visit Number 1    Number of Visits 1    Authorization Type UHC dual + Medicaid    PT Start Time 1015    PT Stop Time 1100    PT Time Calculation (min) 45 min    Activity Tolerance Patient tolerated treatment well;Patient limited by pain    Behavior During Therapy [facility] for tasks assessed/performed             Past Medical History:  Diagnosis Date   ALCOHOL ABUSE 12/13/2005   Annotation: Sober since 11/06 Qualifier: Diagnosis of  By: Elnor MD, Comer     Anemia    Arthritis    all over (02/21/2017)   CAD (coronary artery disease)    s/p non-Q wave MI in 06/2001 and 2004, 2005   CHF (congestive heart failure) (HCC)    Chronic kidney disease (CKD), stage III (moderate) (HCC) 09/19/2010   Chronic mid back pain    Depression    DJD (degenerative joint disease)    DVT (deep venous thrombosis) (HCC)    BLE   DVT of lower extremity, bilateral (HCC) 04/04/2012   On Xarelto      GERD (gastroesophageal reflux disease)    Gout    Hyperlipidemia    Hypertension    INTRINSIC ASTHMA, WITH EXACERBATION 09/27/2009   Qualifier: Diagnosis of  By: Danella MD, Nodira     Migraine    none anymore (02/21/2017)   Myocardial infarction [facility]) ?2005   Obesity    OSA (obstructive sleep apnea)    previously used CPAP, has lost it (02/21/2017)   Seizures (HCC)    As a teenager    Past Surgical History:  Procedure Laterality Date   ABDOMINAL HYSTERECTOMY     partial; due to endometriosis   CARDIAC CATHETERIZATION  06/2001, 02/2002, 10/2004    (10-15% proximal stenosis of circumflex, diffuse disease of  OM1 and RCA)   CARDIAC CATHETERIZATION  02/21/2017   COLONOSCOPY     JOINT REPLACEMENT     KNEE ARTHROSCOPY Left    LEFT HEART CATH AND CORONARY  ANGIOGRAPHY N/A 02/21/2017   Procedure: LEFT HEART CATH AND CORONARY ANGIOGRAPHY;  Surgeon: Levern Hutching, MD;  Location: [facility] INVASIVE CV LAB;  Service: Cardiovascular;  Laterality: N/A;   TOTAL HIP ARTHROPLASTY Right 11/23/2014   Procedure: RIGHT TOTAL HIP ARTHROPLASTY ANTERIOR APPROACH;  Surgeon: Lonni CINDERELLA Poli, MD;  Location: [facility] OR;  Service: Orthopedics;  Laterality: Right;   TUBAL LIGATION     Patient Active Problem List   Diagnosis Date Noted   Pectoralis muscle strain 11/14/2022   Lymphedema 07/27/2022   Spondylosis without myelopathy or radiculopathy, lumbosacral region 07/27/2022   Vertebrogenic low back pain 07/27/2022   Shellfish allergy 03/19/2018   Unilateral primary osteoarthritis, left knee 04/29/2017   Unilateral primary osteoarthritis, right knee 04/29/2017   Chronic stable angina (HCC) 02/21/2017   LVH (left ventricular hypertrophy) 08/15/2016   History of vitamin D  deficiency 05/04/2016   Status post total replacement of right hip 11/23/2014   Osteoarthritis of right hip 10/13/2014   History of DVT (deep vein thrombosis), multiple, lifelong anticoagulation 04/04/2012   Routine health maintenance 01/14/2012   GERD (gastroesophageal reflux disease) 01/09/2012   Urinary incontinence 12/31/2011   Chronic kidney disease (CKD), stage  III (moderate) (HCC) 09/19/2010   Asthma, chronic 09/27/2009   Elevated alkaline phosphatase level 08/01/2009   Pain in joint, multiple sites 12/21/2008   Major depressive disorder, recurrent episode, moderate (HCC) 09/08/2008   Gout 07/05/2008   Hyperlipidemia 05/07/2006   Coronary atherosclerosis 02/05/2006   Morbid obesity (HCC) 12/13/2005   History of alcohol abuse 12/13/2005   SLEEP APNEA, OBSTRUCTIVE 12/13/2005   Essential hypertension 12/13/2005    PCP: Dayton Eastern, DO  REFERRING PROVIDER: Dayton Eastern, DO  THERAPY DIAG:  Other abnormalities of gait and mobility - Plan: PT plan of care cert/re-cert  Muscle weakness  (generalized) - Plan: PT plan of care cert/re-cert  Abnormal posture - Plan: PT plan of care cert/re-cert  Difficulty in walking, not elsewhere classified - Plan: PT plan of care cert/re-cert  Rationale for Evaluation and Treatment Rehabilitation  SUBJECTIVE:                                                                                                                                                                                           SUBJECTIVE STATEMENT: Pt presents for wheelchair evaluation. She arrives to clinic with family in clinic manual wc. Has a ~71yo pwc at home that no longer holds a charge. She uses the wc in her home and in the community.   PRECAUTIONS: Fall   WEIGHT BEARING RESTRICTIONS No    OCCUPATION: retired  PLOF:  Needs assistance with ADLs, Needs assistance with homemaking, Needs assistance with gait, and Needs assistance with transfers  PATIENT GOALS: to get a new wc      MEDICAL HISTORY:  Primary diagnosis onset: insidious onset      Medical Diagnosis with ICD-10 code: M25.50- Pain in joint, multiple sites   [] Progressive disease  Relevant future surgeries:     Height: 5'2 Weight: 300# Explain recent changes or trends in weight:      History:  Past Medical History:  Diagnosis Date   ALCOHOL ABUSE 12/13/2005   Annotation: Sober since 11/06 Qualifier: Diagnosis of  By: Elnor MD, Comer     Anemia    Arthritis    all over (02/21/2017)   CAD (coronary artery disease)    s/p non-Q wave MI in 06/2001 and 2004, 2005   CHF (congestive heart failure) (HCC)    Chronic kidney disease (CKD), stage III (moderate) (HCC) 09/19/2010   Chronic mid back pain    Depression    DJD (degenerative joint disease)    DVT (deep venous thrombosis) (HCC)    BLE   DVT of lower extremity, bilateral (HCC) 04/04/2012   On Xarelto      GERD (gastroesophageal reflux  disease)    Gout    Hyperlipidemia    Hypertension    INTRINSIC ASTHMA, WITH EXACERBATION  09/27/2009   Qualifier: Diagnosis of  By: Danella MD, Nodira     Migraine    none anymore (02/21/2017)   Myocardial infarction [facility]) ?2005   Obesity    OSA (obstructive sleep apnea)    previously used CPAP, has lost it (02/21/2017)   Seizures (HCC)    As a teenager        Cardio Status:  Functional Limitations:   [] Intact  [x]  Impaired    CAD, LVH, HTN  Respiratory Status:  Functional Limitations:   [] Intact  [x] Impaired   [] SOB [] COPD [] O2 Dependent ______LPM  [] Ventilator Dependent  Resp equip:                                                     Objective Measure(s):   Orthotics:   [] Amputee:                                                             [] Prosthesis:      HOME ENVIRONMENT:  [] House [] Condo/town home [x] Apartment [] Asst living [] LTCF         [] Own  [x] Rent   [x] Lives alone [] Lives with others -                             Hours without assistance: sporadic, has a CNA that helps her M-F  [x] Home is accessible to patient                                 Storage of wheelchair:  [x] In home   [] Other Comments:       COMMUNITY :  TRANSPORTATION:  [x] Car [] Advertising Account Executive [] Adapted w/c Lift []  Ambulance [] Other:                     [x] Sits in wheelchair during transport when using public transportation  Where is w/c stored during transport?  [x] [place]  []  [employer]  r   [] Self-Driver       Drive while in  Biomedical Scientist [] yes [x] no   Employment and/or school:  Specific requirements pertaining to mobility        Other:  COMMUNICATION:  Verbal Communication  [x] WFL [] receptive [x] WFL [] expressive [x] Understandable  [] Difficult to understand  [] non-communicative  Primary Language:______________ 2nd:_____________  Communication provided by:[x] Patient [] Family [] Caregiver [] Translator   [] Uses an augmentative communication device     Manufacturer/Model :                                          MOBILITY/BALANCE:  Sitting Balance  Standing Balance   Transfers  Ambulation   [] WFL      [] WFL  [] Independent  []  Independent   [x] Uses UE for balance in sitting Comments:  [] Uses UE/device for stability Comments:  []  Min assist  []  Ambulates  independently with       device:___________________      [x]  Mod assist  []  Able to ambulate ______ feet        safely/functionally/independently   []  Min assist  []  Min assist  []  Max assist  [x]  Non-functional ambulator         History/High risk of falls   []  Mod assist  [x]  Mod assist  []  Dependent  []  Unable to ambulate   []  Max  assist  []  Max assist  Transfer method:[x] 1 person [x] 2 person [] sliding board [] squat pivot [x] stand pivot [] mechanical patient lift  [] other:   []  Unable  []  Unable    Fall History: # of falls in the past 6 months? 2; was standing up from wc and fell  # of "near" falls in the past 6 months? Multiple, R knee will give out     CURRENT SEATING / MOBILITY:  Current Mobility Device: [] None [] Cane/Walker [] Manual [] Dependent [] Dependent w/ Tilt rScooter  [x] Power (type of control):   Manufacturer:  Model:  Serial #:   Size:  Color:  Age:   Purchased by whom:   Current condition of mobility base:    Current seating system:                                                                       Age of seating system:    Describe posture in present seating system:    Is the current mobility meeting medical necessity?:  [] Yes [x] No Describe: battery heats up when it's charging; one of the wheels is starting to break making the chair unsafe to use   Ability to complete Mobility-Related Activities of Daily Living (MRADL's) with Current Mobility Device:   Move room to room  [] Independent  [x] Min [] Mod [] Max assist  [] Unable  Comments:   Meal prep  [] Independent  [x] Min [] Mod [] Max assist  [] Unable    Feeding  [] Independent  [x] Min [] Mod [] Max assist  [] Unable    Bathing  [] Independent  [x] Min [] Mod [] Max assist  [] Unable    Grooming  [] Independent  [x] Min [] Mod [] Max assist  [] Unable     UE dressing  [] Independent  [x] Min [] Mod [] Max assist  [] Unable    LE dressing  [] Independent   [x] Min [] Mod [] Max assist  [] Unable    Toileting  [] Independent  [x] Min [] Mod [] Max assist  [] Unable    Bowel Mgt: []  Continent []  Incontinent [x]  Accidents []  Diapers []  Colostomy []  Bowel Program:  Bladder Mgt: []  Continent []  Incontinent [x]  Accidents []  Diapers []  Urinal []  Intermittent Cath []  Indwelling Cath []  Supra-pubic Cath     Current Mobility Equipment Trialed/ Ruled Out:    Does not meet mobility needs due to:    Oneil all boxes that indicate inability to use the specific equipment listed     Meets needs for safe  independent functional  ambulation  / mobility    Risk of  Falling or History of Falls    Enviromental limitations      Cognition    Safety concerns with  physical ability    Decreased / limitations endurance  & strength     Decreased / limitations  motor skills  & coordination    Pain  Pace /  Speed    Cardiac and/or  respiratory condition    Contra - indicated by diagnosis   Cane/Crutches  []   [x]   []   []   [x]   [x]   [x]   [x]   [x]   [x]   []    Walker / Rollator  []  NA   []   [x]   []   []   [x]   [x]   [x]   [x]   [x]   [x]   []     Manual Wheelchair [id number]:  []  NA  []   [x]   []   []   [x]   [x]   [x]   [x]   [x]   [x]   []    Manual W/C (K0005) with power assist  [x]  NA  []   []   []   []   []   []   []   []   []   []   []    Scooter  []  NA  []   [x]   [x]   []   [x]   [x]   [x]   [x]   [x]   [x]   []    Power Wheelchair: standard joystick  []  NA  [x]   []   []   []   []   []   []   []   []   []   []    Power Wheelchair: alternative controls  []  NA  []   []   []   []   []   []   []   []   []   []   []    Summary:  The least costly alternative for independent functional mobility was found to be:    []  Crutch/Cane  []  Walker []  Manual w/c  []  Manual w/c with power assist   []  Scooter   [x]  Power w/c std joystick   []  Power w/c alternative control        []  Requires dependent care mobility device   Loss Adjuster, [employer] skills are adequate for safe mobility equipment operation  [x]   Yes []   No  Patient is willing and motivated to use recommended mobility equipment  [x]   Yes []   No       []  Patient is unable to safely operate mobility equipment independently and requires dependent care equipment Comments:           SENSATION and SKIN ISSUES:  Sensation []  Intact  [x]  Impaired []  Absent []  Hyposensate []  Hypersensate  []  Defensiveness  Location(s) of impairment: Distal B UE; B LE impaired hips distal   Pressure Relief Method(s):  []  Lean side to side to offload (without risk of falling)  []   W/C push up (4+ times/hour for 15+ seconds) []  Stand up (without risk of falling)    []  Other: (Describe): Effective pressure relief method(s) above can be performed consistently throughout the day: [] Yes  [x]  No If not, Why?:  Skin Integrity Risk:       []  Low risk           []  Moderate risk            [x]  High risk  If high risk, explain: Braden scale: 12; lacks UE and trunk strength to complete lateral lean completely and wc push up; unable to stand without substantial risk for falling (has had multiple falls standing up from wc)  Skin Issues/Skin Integrity  Current skin Issues  []  Yes [x]  No [x]  Intact  []   Red area   []   Open area  []  Scar tissue  [x]  At risk from prolonged sitting  Where: sacrum, coccyx, ischial tuberosities  History of Skin Issues  []  Yes [x]  No Where : When: Stage: Hx of  skin flap surgeries  []  Yes [x]  No Where:  When:  Pain: [x]  Yes []  No   Pain Location(s): B knees Intensity scale: (0-10) : 6/10 currently, 10/10 at its worse, 2/10 at its best How does pain interfere with mobility and/or MRADLs? - Patient unable to complete any MRADLs without significant pain necessitating assist from CNA   MAT EVALUATION:  Neuro-Muscular Status: (Tone, Reflexive, Responses, etc.)     [x]   Intact   []  Spasticity:  []  Hypotonicity  []   Fluctuating  []  Muscle Spasms  []  Poor Righting Reactions/Poor Equilibrium Reactions  []  Primal Reflex(s):    Comments:            COMMENTS:    POSTURE:     Comments:  Pelvis Anterior/Posterior:  []  Neutral   [x]  Posterior  []  Anterior  []  Fixed - No movement []  Tendency away from neutral [x]  Flexible []  Self-correction [x]  External correction Obliquity (viewed from front)  [x]  WFL []  R Obliquity []  L Obliquity  []  Fixed - No movement []  Tendency away from neutral []  Flexible []  Self-correction []  External correction Rotation  [x]  WFL []  R anterior []  L anterior  []  Fixed - No movement []  Tendency away from neutral []  Flexible []  Self-correction []  External correction Tonal Influence Pelvis:  [x]  Normal []  Flaccid []  Low tone []  Spasticity []  Dystonia []  Pelvis thrust []  Other:    Trunk Anterior/Posterior:  []  WFL [x]  Thoracic kyphosis []  Lumbar lordosis  []  Fixed - No movement [x]  Tendency away from neutral []  Flexible []  Self-correction [x]  External correction  [x]  WFL []  Convex to left  []  Convex to right []  S-curve   []  C-curve []  Multiple curves []  Tendency away from neutral []  Flexible []  Self-correction []  External correction Rotation of shoulders and upper trunk:  [x]  Neutral []  Left-anterior []  Right- anterior []  Fixed- no movement []  Tendency away from neutral []  Flexible []  Self correction []  External correction Tonal influence Trunk:  [x]  Normal []  Flaccid []  Low tone []  Spasticity []  Dystonia []  Other:   Head & Neck  [x]  Functional []  Flexed    []  Extended []  Rotated right  []  Rotated left []  Laterally flexed right []  Laterally flexed left []  Cervical hyperextension   [x]  Good head control []  Adequate head control []  Limited head control []  Absent head control Describe tone/movement of head and neck:    Lower Extremity Measurements: LE ROM:  Active ROM Right 03/01/2023 Left 03/01/2023  Hip flexion     Hip extension    Hip abduction    Hip adduction    Knee flexion 32*   Knee extension    Ankle dorsiflexion    Ankle plantarflexion     (Blank rows = not tested)  LE MMT:  MMT Right 03/01/2023 Left 03/01/2023  Hip flexion 3+ P! 3+ P!  Hip extension    Hip abduction 3 3  Hip adduction 3 3  Knee flexion 2- P! 2 P!  Knee extension 3 P! 3 P!  Ankle dorsiflexion 3 3  Ankle plantarflexion 3 3   (Blank rows = not tested)  Hip positions:  [x]  Neutral   []  Abducted   []  Adducted  []  Subluxed   []  Dislocated   []  Fixed   []  Tendency away from neutral []  Flexible []  Self-correction []  External correction   Hip Windswept:[x]  Neutral  []  Right    []  Left  []  Subluxed   []  Dislocated   []  Fixed   []  Tendency  away from neutral []  Flexible []  Self-correction []  External correction  LE Tone: [x]  Normal []  Low tone []  Spasticity []  Flaccid []  Dystonia []  Rocks/Extends at hip []  Thrust into knee extension []  Pushes legs downward into footrest  Foot positioning: ROM Concerns: Dorsiflexed: []  Right   []  Left Plantar flexed: []  Right    []  Left Inversion: [x]  Right    [x]  Left Eversion: []  Right    []  Left  LE Edema: []  1+ (Barely detectable impression when finger is pressed into skin) [x]  2+ (slight indentation. 15 seconds to rebound) []  3+ (deeper indentation. 30 seconds to rebound) []  4+ (>30 seconds to rebound)  UE Measurements:    UPPER EXTREMITY MMT:  MMT Right 03/01/2023 Left 03/01/2023  Shoulder flexion 3+ 3+  Shoulder abduction 4- 4-  Shoulder adduction    Elbow flexion 3+ 3+  Elbow extension 3+ 3+  Wrist flexion    Wrist extension    Pinch strength    Grip strength functional decreased  (Blank rows = not tested)  Shoulder Posture:  Right Tendency towards Left  []   Functional []    []   Elevation []    []   Depression []    [x]   Protraction [x]    []   Retraction []    [x]   Internal rotation [x]    []   External rotation []    []   Subluxed []     UE  Tone: [x]  Normal []  Flaccid []  Low tone []  Spasticity  []  Dystonia []  Other:     Wrist/Hand: Handedness: [x]  Right   []  Left   []  NA: Comments:  Right  Left  [x]   WNL [x]    []   Limitations []    []   Contractures []    []   Fisting []    []   Tremors []    []   Weak grasp [x]    []   Poor dexterity []    []   Hand movement non functional []    []   Paralysis []     MOBILITY BASE RECOMMENDATIONS and JUSTIFICATION:  MOBILITY BASE  JUSTIFICATION   Manufacturer:   Quantum Model:                             [id number] Color: Red Seat Width: 22   Seat Depth: 20   []  Manual mobility base (continue below)   []  Scooter/POV  [x]  Power mobility base   Number of hours per day spent in above selected mobility base: ~10  Typical daily mobility base use Schedule: Transfers into power wheelchair with assist to complete any and all MRADLs as independently as possible   [x]  is not a safe, functional ambulator  [x]  limitation prevents from completing a MRADL(s) within a reasonable time frame    [x]  limitation places at high risk of morbidity or mortality secondary to  the attempts to perform a    MRADL(s)  [x]  limitation prevents accomplishing a MRADL(s) entirely  [x]  provide independent mobility  [x]  equipment is a lifetime medical need  [x]  walker or cane inadequate  [x]  any type manual wheelchair      inadequate  [x]  scooter/POV inadequate      []  requires dependent mobility        POWER MOBILITY      []  Scooter/POV    []  can safely operate   []  can safely transfer   []  has adequate trunk stability   []  cannot functionally propel  manual wheelchair    [x]  Power mobility base; HD   [  x] non-ambulatory   [x]  cannot functionally propel manual wheelchair   [x]  cannot functionally and safely      operate scooter/POV  [x]  can safely operate power       wheelchair  [x]  home is accessible  [x]  willing to use power wheelchair     Tilt  [x]  Powered tilt on powered chair  []  Powered tilt on  manual chair  []  Manual tilt on manual chair Comments:  [x]  change position for pressure      []  elief/cannot weight shift   []  change position against      gravitational force on head and      shoulders   [x]  decrease pain  []  blood pressure management   []  control autonomic dysreflexia  []  decrease respiratory distress  []  management of spasticity  []  management of low tone  [x]  facilitate postural control   [x]  rest periods   [x]  control edema  [x]  increase sitting tolerance   []  aid with transfers     Recline   []  Power recline on power chair  []  Manual recline on manual chair  Comments:    []  intermittent catheterization  []  manage spasticity  []  accommodate femur to back angle  []  change position for pressure relief/cannot weight shift rhigh risk of pressure sore development  []  tilt alone does not accomplish     effective pressure relief, maximum pressure relief achieved at -      _______ degrees tilt   _______ degrees recline   []  difficult to transfer to and from bed []  rest periods and sleeping in chair  []  repositioning for transfers  []  bring to full recline for ADL care  []  clothing/diaper changes in chair  []  gravity PEG tube feeding  []  head positioning  []  decrease pain  []  blood pressure management   []  control autonomic dysreflexia  []  decrease respiratory distress  []  user on ventilator     Elevator on mobility base  []  Power wheelchair  []  Scooter  []  increase Indep in transfers   []  increase Indep in ADLs    []  bathroom function and safety  []  kitchen/cooking function and safety  []  shopping  []  raise height for communication at standing level  []  raise height for eye contact which reduces cervical neck strain and pain  []  drive at raised height for safety and navigating crowds  []  Other:   []  Vertical position system  (anterior tilt)     (Drive locks-out)    []  Stand       (Drive enabled)  []  independent weight bearing  []  decrease joint  contractures  []  decrease/manage spasticity  []  decrease/manage spasms  []  pressure distribution away from   scapula, sacrum, coccyx, and ischial tuberosity  []  increase digestion and elimination   []  access to counters and cabinets  []  increase reach  []  increase interaction with others at eye level, reduces neck strain  []  increase performance of       MRADL(s)      Power elevating legrest    [x]  Center mount (Single) 85-170 degrees       []  Standard (Pair) 100-170 degrees  []  position legs at 90 degrees, not available with std power ELR  [x]  center mount tucks into chair to decrease turning radius in home, not available with std power ELR  [x]  provide change in position for LE  []  elevate legs during recline    [x]  maintain placement of feet on  footplate  [x]  decrease edema  [x]  improve circulation  [x]  actuator needed to elevate legrest  []  actuator needed to articulate legrest preventing knees from flexing  [x]  Increase ground clearance over      curbs  []   STD (pair) independently                     elevate legrest   POWER WHEELCHAIR CONTROLS      Controls/input device  [x]  Expandable  []  Non-expandable  [x]  Proportional  [x]  Right Hand []  Left Hand  []  Non-proportional/switches/head-array  []  Electrical/proximity         []   Mechanical      Manufacturer:___________________   Type:________________________ [x]  provides access for controlling wheelchair  [x]  programming for accurate control  []  progressive disease/changing condition  []  required for alternative drive      controls       []  lacks motor control to operate  proportional drive control  []  unable to understand proportional controls  []  limited movement/strength  []  extraneous movement / tremors / ataxic / spastic     [x]  Upgraded electronics controller/harness    [x]  Single power (tilt or recline)   [x]  Expandable    []  Non-expandable plus   []  Multi-power (tilt, recline, power legrest, power  seat lift, vertical positioning system, stand)  [x]  allows input device to communicate with drive motors  [x]  harness provides necessary connections between the controller, input device, and seat functions     [x]  needed in order to operate power seat functions through joystick/ input device  []  required for alternative drive controls     []  Enhanced display  []  required to connect all alternative drive controls   []  required for upgraded joystick      (lite-throw, heavy duty, micro)  []  Allows user to see in which mode and drive the wheelchair is set; necessary for alternate controls       []  Upgraded tracking electronics  []  correct tracking when on uneven surfaces makes switch driving more efficient and less fatiguing  []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

## 2023-03-07 ENCOUNTER — Telehealth: Payer: Self-pay | Admitting: *Deleted

## 2023-03-07 NOTE — Telephone Encounter (Signed)
 Patient returned my call regarding her PCS information -[facility] provides her service. Per April @ [facility] [phone], Vanessa Palmer receives 80 per month of PCS.

## 2023-03-07 NOTE — Telephone Encounter (Signed)
 LVM for patient to return call to the clinic to let me know the name of the [facility] agency that is providing her care and their phone number . Waiting call back.

## 2023-03-11 ENCOUNTER — Other Ambulatory Visit: Payer: Self-pay

## 2023-03-11 MED ORDER — CYCLOBENZAPRINE HCL 10 MG PO TABS: 10.0000 mg | ORAL_TABLET | Freq: Two times a day (BID) | ORAL | 0 refills | Status: DC | PRN

## 2023-03-11 NOTE — Telephone Encounter (Signed)
 Patient called. Says she did not get the refill on her muscle relaxer's. Would like something called in.

## 2023-03-15 ENCOUNTER — Encounter: Payer: Self-pay | Admitting: Student

## 2023-03-15 ENCOUNTER — Ambulatory Visit (INDEPENDENT_AMBULATORY_CARE_PROVIDER_SITE_OTHER): Payer: 59 | Admitting: Student

## 2023-03-15 ENCOUNTER — Other Ambulatory Visit: Payer: Self-pay

## 2023-03-15 VITALS — BP 136/66 | HR 67 | Temp 97.7°F | Ht 62.0 in | Wt 300.8 lb

## 2023-03-15 DIAGNOSIS — M170 Bilateral primary osteoarthritis of knee: Secondary | ICD-10-CM

## 2023-03-15 DIAGNOSIS — M2550 Pain in unspecified joint: Secondary | ICD-10-CM

## 2023-03-15 DIAGNOSIS — F331 Major depressive disorder, recurrent, moderate: Secondary | ICD-10-CM | POA: Diagnosis not present

## 2023-03-15 NOTE — Progress Notes (Unsigned)
Established Patient Office Visit  Subjective   Patient ID: Vanessa Palmer, female    DOB: 11/12/1950  Age: 73 y.o. MRN: [id number]  No chief complaint on file.   Vanessa Palmer is a 73 y.o. who presents to the clinic for a face to face evaluation for renewal for a PWC and follow up of depression. Please see problem based assessment and plan for additional details.   Past Medical History:  Diagnosis Date   ALCOHOL ABUSE 12/13/2005   Annotation: Sober since 11/06 Qualifier: Diagnosis of  By: Wallace Cullens MD, Natalia Leatherwood     Anemia    Arthritis    "all over" (02/21/2017)   CAD (coronary artery disease)    s/p non-Q wave MI in 06/2001 and 2004, 2005   CHF (congestive heart failure) (HCC)    Chronic kidney disease (CKD), stage III (moderate) (HCC) 09/19/2010   Chronic mid back pain    Depression    DJD (degenerative joint disease)    DVT (deep venous thrombosis) (HCC)    BLE   DVT of lower extremity, bilateral (HCC) 04/04/2012   On Xarelto     GERD (gastroesophageal reflux disease)    Gout    Hyperlipidemia    Hypertension    INTRINSIC ASTHMA, WITH EXACERBATION 09/27/2009   Qualifier: Diagnosis of  By: Denton Meek MD, Nodira     Migraine    "none anymore" (02/21/2017)   Myocardial infarction (HCC) ?2005   Obesity    OSA (obstructive sleep apnea)    previously used CPAP, has lost it (02/21/2017)   Seizures (HCC)    As a teenager        Objective:     There were no vitals taken for this visit. BP Readings from Last 3 Encounters:  03/15/23 136/66  01/31/23 (!) 200/101  01/18/23 (!) 141/81   Wt Readings from Last 3 Encounters:  03/15/23 (!) 300 lb 12.8 oz (136.4 kg)  01/31/23 (!) 300 lb 4.8 oz (136.2 kg)  01/18/23 (!) 300 lb 4.8 oz (136.2 kg)      Physical Exam Vitals reviewed.  Constitutional:      General: She is awake.     Appearance: She is morbidly obese.     Comments: Patient is sitting in a chair with family member at bedside.   Cardiovascular:     Rate and  Rhythm: Normal rate and regular rhythm.     Heart sounds: No murmur heard. Pulmonary:     Effort: Pulmonary effort is normal. No respiratory distress.     Breath sounds: Normal breath sounds. No stridor. No wheezing or rhonchi.  Skin:    General: Skin is warm and dry.  Neurological:     Mental Status: She is alert.  Psychiatric:        Mood and Affect: Mood is depressed. Affect is flat.        Behavior: Behavior is cooperative.      No results found for any visits on 03/15/23.  Last CBC Lab Results  Component Value Date   WBC 5.9 01/31/2023   HGB 11.7 (L) 01/31/2023   HCT 35.1 (L) 01/31/2023   MCV 89.1 01/31/2023   MCH 29.7 01/31/2023   RDW 13.3 01/31/2023   PLT 192 01/31/2023   Last metabolic panel Lab Results  Component Value Date   GLUCOSE 109 (H) 01/31/2023   NA 136 01/31/2023   K 4.1 01/31/2023   CL 99 01/31/2023   CO2 28 01/31/2023   BUN 34 (H)  01/31/2023   CREATININE 1.65 (H) 01/31/2023   GFRNONAA 33 (L) 01/31/2023   CALCIUM 9.5 01/31/2023   PHOS 3.3 04/05/2012   PROT 7.9 10/05/2022   ALBUMIN 4.1 10/05/2022   LABGLOB 3.8 10/05/2022   AGRATIO 1.0 (L) 07/21/2021   BILITOT 0.6 10/05/2022   ALKPHOS 166 (H) 10/05/2022   AST 18 10/05/2022   ALT 10 10/05/2022   ANIONGAP 9 01/31/2023      The ASCVD Risk score (Arnett DK, et al., 2019) failed to calculate for the following reasons:   The valid total cholesterol range is 130 to 320 mg/dL    Assessment & Plan:   Problem List Items Addressed This Visit   None  FTF: PWC renewal chronic knee pain  Difficulty walking 2/2 severe knee osteoarthritis  10 years  Going to attempt water aeorbics, PT  came to the house 1 month ago Difficulty transfer  Would help with ADLs ; helps with ADLs  Still using incontinent supplies  Home is accessible   MDD: zoloft 100mg  2 months ago--> worsened after losing her sister  Not doing well, going through a lot with family Declined bianca, good family  Moderate depression  PHQ9 score today of 17 Increase to 150 daily?   Weight loss Limited with exercise  Calorie counting    No follow-ups on file.    Faith Rogue, DO

## 2023-03-15 NOTE — Patient Instructions (Signed)
Thank you, Vanessa Palmer for allowing [name] to provide your care today. Today we discussed your need for a powered wheel chair for chronic knee pain, weight loss, depression.      Referrals ordered today:   Referral Orders  No referral(s) requested today     I have ordered the following medication/changed the following medications:   Stop the following medications: There are no discontinued medications.   Start the following medications: No orders of the defined types were placed in this encounter.    Follow up: 4-6 weeks    Remember: Please download the myfitnessapp. Please count your calories with a goal of a 500 calorie deficit. Zoloft will be increased 200 mg daily.   Should you have any questions or concerns please call the internal medicine clinic at [phone].     Faith Rogue, D.O. [facility]

## 2023-03-16 DIAGNOSIS — M179 Osteoarthritis of knee, unspecified: Secondary | ICD-10-CM | POA: Insufficient documentation

## 2023-03-16 NOTE — Assessment & Plan Note (Deleted)
Patient presented for a face-to-face encounter today for a new power wheelchair.  Her power wheelchair is used for her bilateral chronic knee pain secondary due to severe bilateral knee osteoarthritis.  Patient's part has been using a power wheelchair for least 10 years per patient.  She stated that she has difficulties with the ADLs when she is unable to use a powered wheelchair.  Her powered wheelchair helps her with ambulation and with transfer.   Plan: -I agree with the physical therapist assessment a new power wheelchair will greatly benefit this patient.  Her home is currently accessible for a powered wheelchair. -Patient encouraged to continue physical therapy and water aerobics for weight loss for a bilateral knee replacement.

## 2023-03-16 NOTE — Assessment & Plan Note (Signed)
Patient presented for a face-to-face encounter today for a new power wheelchair.  Her power wheelchair is used for her bilateral chronic knee pain secondary due to severe bilateral knee osteoarthritis.  Patient's part has been using a power wheelchair for least 10 years per patient.  She stated that she has difficulties with the ADLs when she is unable to use a powered wheelchair.  Her powered wheelchair helps her with ambulation and with transfer.   Plan: -I agree with the physical therapist assessment a new power wheelchair will greatly benefit this patient.  Her home is currently accessible for a powered wheelchair. -Patient encouraged to continue physical therapy and water aerobics for weight loss for a bilateral knee replacement.

## 2023-03-16 NOTE — Assessment & Plan Note (Signed)
Patient has a history of MDD that worsened after the death of her sister in December 31, 2023. Her zoloft dose was increased to 100mg  daily. She denied much improvement in her depression which was reflected on the PHQ9 with an increased score from 15 to 17.  Plan: -Increase Zoloft to 200 mg daily -Pt declined therapy at this time

## 2023-03-18 DIAGNOSIS — Z79891 Long term (current) use of opiate analgesic: Secondary | ICD-10-CM | POA: Diagnosis not present

## 2023-03-18 NOTE — Progress Notes (Signed)
 Internal Medicine Clinic Attending  Case discussed with the resident physician at the time of the visit.  We reviewed the patient's history, exam, and pertinent patient test results.  I agree with the assessment, diagnosis, and plan of care documented in the resident's note.

## 2023-04-02 ENCOUNTER — Other Ambulatory Visit: Payer: Self-pay

## 2023-04-02 DIAGNOSIS — M2550 Pain in unspecified joint: Secondary | ICD-10-CM

## 2023-04-03 MED ORDER — OXYCODONE-ACETAMINOPHEN 10-325 MG PO TABS: 1.0000 | ORAL_TABLET | Freq: Three times a day (TID) | ORAL | 0 refills | Status: DC | PRN

## 2023-04-18 ENCOUNTER — Ambulatory Visit: Payer: Self-pay

## 2023-04-18 NOTE — Patient Instructions (Signed)
Visit Information  Thank you for taking time to visit with me today. Please don't hesitate to contact me if I can be of assistance to you.   Following are the goals we discussed today:   Goals Addressed             This Visit's Progress    I am concerned about my blood pressure and my swelling.       Patient Goals/Self Care Activities: -Patient/Caregiver will self-administer medications as prescribed as evidenced by self-report/primary caregiver report  -Patient/Caregiver will attend all scheduled provider appointments as evidenced by clinician review of documented attendance to scheduled appointments and patient/caregiver report -Patient/Caregiver will call provider office for new concerns or questions as evidenced by review of documented incoming telephone call notes and patient report  -Calls provider office for new concerns, questions, or BP outside discussed parameters -Checks BP and records as discussed -Follows a low sodium diet/DASH diet  -patient has a new bp monitor -Check blood pressure at least 3 times a week -Monitor your food intake - we have reviewed BEFAST, remember the signs and symptoms of a stroke -elevate legs  BP Readings from Last 3 Encounters:  03/15/23 136/66  01/31/23 (!) 200/101  01/18/23 (!) 141/81    -maintain exercise -ensure hydration        Our next appointment is by telephone on 05/21/23 at 315 pm  Please call the care guide team at [phone] if you need to cancel or reschedule your appointment.   If you are experiencing a Mental Health or Behavioral Health Crisis or need someone to talk to, please call 1-800-273-TALK (toll free, 24 hour hotline)  Patient verbalizes understanding of instructions and care plan provided today and agrees to view in MyChart. Active MyChart status and patient understanding of how to access instructions and care plan via MyChart confirmed with patient.     Juanell Fairly RN, BSN, [facility]  [facility], [facility]  Care Coordinator Phone: [phone]

## 2023-04-18 NOTE — Patient Outreach (Signed)
Care Coordination   Follow Up Visit Note   04/18/2023 Name: Vanessa Palmer MRN: [id number] DOB: 02/13/1951  Vanessa Palmer is a 73 y.o. year old female who sees Gust Rung, DO for primary care. I spoke with  Vanessa Palmer by phone today.  What matters to the patients health and wellness today?  Vanessa Palmer reported that she experienced a fall yesterday while transitioning from her chair to her bed, resulting in her landing on her right side. While she stated that she did not sustain any injuries, she is experiencing soreness this morning. I inquired whether she had any topical analgesics available, such as Bengay or Voltaren gel, to alleviate her discomfort; however, she indicated that she did not possess any such products. She mentioned that she was able to take a pain relief medication and subsequently returned to sleep. Overall, she conveyed that she is functioning adequately.  She also noted that her blood pressure was slightly elevated this morning, which is likely attributable to her fall and the accompanying pain; nonetheless, she reported that it remained generally within acceptable ranges. She expressed pride in her blood pressure management but was unable to provide specific numerical values as she had not recorded them. I requested that she document these figures moving forward to facilitate monitoring of her blood pressure readings.  Furthermore, Vanessa Palmer denied the presence of any headaches or chest pain; however, she did report experiencing sharp pain in her left ear that radiates to the left side of her face. She has a scheduled appointment with her physician for tomorrow, during which she intends to discuss this issue, having indicated that the discomfort has persisted for over two weeks. I affirmed that this was a prudent course of action and requested that she inform me of the outcome of her consultation. Additionally, we have arranged a follow-up appointment in one month.     Goals Addressed             This Visit's Progress    I am concerned about my blood pressure and my swelling.       Patient Goals/Self Care Activities: -Patient/Caregiver will self-administer medications as prescribed as evidenced by self-report/primary caregiver report  -Patient/Caregiver will attend all scheduled provider appointments as evidenced by clinician review of documented attendance to scheduled appointments and patient/caregiver report -Patient/Caregiver will call provider office for new concerns or questions as evidenced by review of documented incoming telephone call notes and patient report  -Calls provider office for new concerns, questions, or BP outside discussed parameters -Checks BP and records as discussed -Follows a low sodium diet/DASH diet  -patient has a new bp monitor -Check blood pressure at least 3 times a week -Monitor your food intake - we have reviewed BEFAST, remember the signs and symptoms of a stroke -elevate legs  BP Readings from Last 3 Encounters:  03/15/23 136/66  01/31/23 (!) 200/101  01/18/23 (!) 141/81    -maintain exercise -ensure hydration        SDOH assessments and interventions completed:  No     Care Coordination Interventions:  Yes, provided   Interventions Today    Flowsheet Row Most Recent Value  Chronic Disease   Chronic disease during today's visit Hypertension (HTN)  General Interventions   General Interventions Discussed/Reviewed General Interventions Discussed, General Interventions Reviewed  Pharmacy Interventions   Pharmacy Dicussed/Reviewed Pharmacy Topics Discussed  Safety Interventions   Safety Discussed/Reviewed Safety Discussed, Fall Risk        Follow  up plan: Follow up call scheduled for 05/21/23  315 pm    Encounter Outcome:  Patient Visit Completed   Juanell Fairly RN, BSN, [facility]   [facility], [facility]  Care Coordinator Phone: [phone]

## 2023-04-19 ENCOUNTER — Encounter: Payer: Self-pay | Admitting: Student

## 2023-04-19 ENCOUNTER — Ambulatory Visit: Payer: 59 | Admitting: Student

## 2023-04-19 ENCOUNTER — Other Ambulatory Visit: Payer: Self-pay

## 2023-04-19 VITALS — BP 131/66 | HR 59 | Temp 97.5°F | Ht 62.0 in | Wt 305.6 lb

## 2023-04-19 DIAGNOSIS — F331 Major depressive disorder, recurrent, moderate: Secondary | ICD-10-CM | POA: Diagnosis not present

## 2023-04-19 DIAGNOSIS — M170 Bilateral primary osteoarthritis of knee: Secondary | ICD-10-CM

## 2023-04-19 MED ORDER — SERTRALINE HCL 100 MG PO TABS: 100.0000 mg | ORAL_TABLET | Freq: Every day | ORAL | 3 refills | Status: DC

## 2023-04-19 NOTE — Assessment & Plan Note (Signed)
Patient has a history of MDD, recently exacerbated with the death of her sister.  At her last visit, her Zoloft dose was increased to 200 mg daily, however she feels about the same and does not feel like this medication improved her symptoms.  She previously tried Wellbutrin for several years and I proposed switching back to that as it is less likely to cause weight gain given her severe obesity.  She stated that she cannot remember why she stopped it but feels that she may have had some unpleasant side effects.  Since her depression has remained refractory to both Wellbutrin and Zoloft, I think it was reasonable to have her see a psychiatrist for further management, so I have placed the referral.  In the meantime, I told her she can go back down to 100 mg of Zoloft, as that was working equally effective for her.

## 2023-04-19 NOTE — Patient Instructions (Addendum)
Thank you, Vanessa Palmer for allowing [name] to provide your care today. Today we discussed your depression, arthritis and physical therapy.  As we discussed, I will put in a referral for physical therapy to come to your home and work with you for improvement of your osteoarthritis.  For your depression, you may decrease your dose of Zoloft down to 100 mg daily since it has not really changed how you felt.  I will place a referral to psychiatry should contact you about scheduling an appointment for follow-up.  For your general achiness, you may take a muscle relaxer and use Voltaren gel, which can pick up over-the-counter.  Please take care when walking around the house to avoid falls.   Follow up: 3 months   We look forward to seeing you next time. Please call our clinic at [phone] if you have any questions or concerns. The best time to call is Monday-Friday from 9am-4pm, but there is someone available 24/7. If after hours or the weekend, call the main hospital number and ask for the Internal Medicine Resident On-Call. If you need medication refills, please notify your pharmacy one week in advance and they will send [name] a request.   Thank you for trusting me with your care. Wishing you the best!   Annett Fabian, MD [facility]

## 2023-04-19 NOTE — Progress Notes (Signed)
    CC: Routine Follow Up for depression after last office visit 03/15/2023  HPI:  Vanessa Palmer is a 73 y.o. female with pertinent PMH of MDD, bilateral knee OA, obesity in a wheel chair who presents to the clinic for follow up of MDD. Please see assessment and plan below for further details.  Review of Systems:   Pertinent items noted in HPI and/or A&P.  Physical Exam:  Vitals:   04/19/23 1040  BP: 131/66  Pulse: (!) 59  Temp: (!) 97.5 F (36.4 C)  TempSrc: Oral  SpO2: 100%  Weight: (!) 305 lb 9.6 oz (138.6 kg)  Height: 5\' 2"  (1.575 m)   Middle-aged female, large body habitus sitting in wheelchair, no acute distress Heart rate and rhythm regular, no murmurs, no lower extremity edema Lungs clear to auscultation bilaterally, normal rate and respiratory effort Abdomen soft, nontender, nondistended No focal neurological deficits No tenderness to palpation along the bilateral joint lines of the knees Somber mood and affect  Assessment & Plan:   Major depressive disorder, recurrent episode, moderate (HCC) Patient has a history of MDD, recently exacerbated with the death of her sister.  At her last visit, her Zoloft dose was increased to 200 mg daily, however she feels about the same and does not feel like this medication improved her symptoms.  She previously tried Wellbutrin for several years and I proposed switching back to that as it is less likely to cause weight gain given her severe obesity.  She stated that she cannot remember why she stopped it but feels that she may have had some unpleasant side effects.  Since her depression has remained refractory to both Wellbutrin and Zoloft, I think it was reasonable to have her see a psychiatrist for further management, so I have placed the referral.  In the meantime, I told her she can go back down to 100 mg of Zoloft, as that was working equally effective for her.  OA (osteoarthritis) of bilateral knees History of severe  osteoarthritis of the bilateral knees.  She is waiting for a power wheelchair to arrive at home, as she has severe mobility limitations at home.  She does note also that she fell recently, however she denies hitting her head, losing consciousness or feeling lightheaded.  She is generally sore from this fall, however has no apparent focal injuries.  She is on chronic Percocet for this pain.  I think our goal should be to get her off of chronic pain medications.  One step towards doing that is physical therapy, so I have sent in an order for home health physical therapy as she has severely limited mobility and is unable to leave her home without an assistance device.  This should benefit both her osteoarthritis and her weight loss.  Ultimately, her main goal is weight loss, as her BMI is 55.89 and this puts significant strain on her joints.  She hopes to have knee replacements at some point, however needs to lose weight prior to that surgery.   Patient discussed with Dr. Jonita Albee, MD [facility] Internal Medicine Resident PGY-1 Clinic Phone: [phone] Pager: [phone]

## 2023-04-19 NOTE — Assessment & Plan Note (Addendum)
History of severe osteoarthritis of the bilateral knees.  She is waiting for a power wheelchair to arrive at home, as she has severe mobility limitations at home.  She does note also that she fell recently, however she denies hitting her head, losing consciousness or feeling lightheaded.  She is generally sore from this fall, however has no apparent focal injuries.  She is on chronic Percocet for this pain.  I think our goal should be to get her off of chronic pain medications.  One step towards doing that is physical therapy, so I have sent in an order for home health physical therapy as she has severely limited mobility and is unable to leave her home without an assistance device.  This should benefit both her osteoarthritis and her weight loss.  Ultimately, her main goal is weight loss, as her BMI is 55.89 and this puts significant strain on her joints.  She hopes to have knee replacements at some point, however needs to lose weight prior to that surgery.

## 2023-04-26 NOTE — Progress Notes (Signed)
 Internal Medicine Clinic Attending  Case discussed with the resident at the time of the visit.  We reviewed the resident's history and exam and pertinent patient test results.  I agree with the assessment, diagnosis, and plan of care documented in the resident's note.

## 2023-04-29 ENCOUNTER — Other Ambulatory Visit: Payer: Self-pay

## 2023-04-29 DIAGNOSIS — M2550 Pain in unspecified joint: Secondary | ICD-10-CM

## 2023-04-29 MED ORDER — OXYCODONE-ACETAMINOPHEN 10-325 MG PO TABS: 1.0000 | ORAL_TABLET | Freq: Three times a day (TID) | ORAL | 0 refills | Status: DC | PRN

## 2023-05-21 ENCOUNTER — Ambulatory Visit: Payer: Self-pay

## 2023-05-21 NOTE — Patient Instructions (Signed)
 Visit Information  Thank you for taking time to visit with me today. Please don't hesitate to contact me if I can be of assistance to you.   Following are the goals we discussed today:   Goals Addressed             This Visit's Progress    I am concerned about my blood pressure and my swelling.       Patient Goals/Self Care Activities: -Patient/Caregiver will self-administer medications as prescribed as evidenced by self-report/primary caregiver report  -Patient/Caregiver will attend all scheduled provider appointments as evidenced by clinician review of documented attendance to scheduled appointments and patient/caregiver report -Patient/Caregiver will call provider office for new concerns or questions as evidenced by review of documented incoming telephone call notes and patient report  -Calls provider office for new concerns, questions, or BP outside discussed parameters -Checks BP and records as discussed -Follows a low sodium diet/DASH diet  -patient has a new bp monitor -Check blood pressure at least 3 times a week -Monitor your food intake - we have reviewed BEFAST, remember the signs and symptoms of a stroke -elevate legs  BP Readings from Last 3 Encounters:  04/19/23 131/66  03/15/23 136/66  01/31/23 (!) 200/101    -maintain exercise -ensure hydration        Our next appointment is by telephone on 06/26/23 at 330 pm  Please call the care guide team at [phone] if you need to cancel or reschedule your appointment.   If you are experiencing a Mental Health or Behavioral Health Crisis or need someone to talk to, please call 1-800-273-TALK (toll free, 24 hour hotline)  Patient verbalizes understanding of instructions and care plan provided today and agrees to view in MyChart. Active MyChart status and patient understanding of how to access instructions and care plan via MyChart confirmed with patient.     Juanell Fairly RN, BSN, [facility]  [facility], [facility]  Care Coordinator Phone: [phone]

## 2023-05-21 NOTE — Patient Outreach (Signed)
 Care Coordination   Follow Up Visit Note   05/21/2023 Name: [name] MRN: [id number] DOB: [birth year]  Vanessa Palmer is a 73 y.o. year old female who sees Gust Rung, DO for primary care. I spoke with  [name] by phone today.  What matters to the patients health and wellness today?  I had a discussion with Vanessa Palmer regarding her current health status. She reported that she is not experiencing any chest pain or shortness of breath. Nevertheless, she does mention experiencing some swelling, which she finds to be atypical for her. When inquired about her efforts to elevate her legs, she indicated that she is frequently on her feet, which presents a challenge. She is only able to elevate her legs when she lies down at night.  Vanessa Palmer also stated that her blood pressure has been stable, and she has been documenting her readings; however, she was not at home when I requested this information. I have asked her to bring her blood pressure readings to our next appointment to facilitate monitoring of her condition.  Moreover, Vanessa Palmer reported that she has not consumed alcohol since October 31st. I congratulated her on this accomplishment and encouraged her to maintain her progress. She further noted that she has not experienced any falls and is determined to continue her commitment to her health.       Goals Addressed             This Visit's Progress    I am concerned about my blood pressure and my swelling.       Patient Goals/Self Care Activities: -Patient/Caregiver will self-administer medications as prescribed as evidenced by self-report/primary caregiver report  -Patient/Caregiver will attend all scheduled provider appointments as evidenced by clinician review of documented attendance to scheduled appointments and patient/caregiver report -Patient/Caregiver will call provider office for new concerns or questions as evidenced by review of documented  incoming telephone call notes and patient report  -Calls provider office for new concerns, questions, or BP outside discussed parameters -Checks BP and records as discussed -Follows a low sodium diet/DASH diet  -patient has a new bp monitor -Check blood pressure at least 3 times a week -Monitor your food intake - we have reviewed BEFAST, remember the signs and symptoms of a stroke -elevate legs  BP Readings from Last 3 Encounters:  04/19/23 131/66  03/15/23 136/66  01/31/23 (!) 200/101    -maintain exercise -ensure hydration        SDOH assessments and interventions completed:  No     Care Coordination Interventions:  Yes, provided   Interventions Today    Flowsheet Row Most Recent Value  Chronic Disease   Chronic disease during today's visit Hypertension (HTN)  General Interventions   General Interventions Discussed/Reviewed General Interventions Discussed, General Interventions Reviewed  Pharmacy Interventions   Pharmacy Dicussed/Reviewed Pharmacy Topics Discussed  Safety Interventions   Safety Discussed/Reviewed Safety Discussed, Fall Risk        Follow up plan: Follow up call scheduled for 06/26/23  330 pm    Encounter Outcome:  Patient Visit Completed   Juanell Fairly RN, BSN, [facility]  [facility], [facility]  Care Coordinator Phone: [phone]

## 2023-05-30 ENCOUNTER — Telehealth: Payer: Self-pay | Admitting: Orthopaedic Surgery

## 2023-05-30 ENCOUNTER — Other Ambulatory Visit: Payer: Self-pay | Admitting: Orthopaedic Surgery

## 2023-05-30 ENCOUNTER — Other Ambulatory Visit: Payer: Self-pay | Admitting: Internal Medicine

## 2023-05-30 DIAGNOSIS — M2550 Pain in unspecified joint: Secondary | ICD-10-CM

## 2023-05-30 MED ORDER — OXYCODONE-ACETAMINOPHEN 10-325 MG PO TABS: 1.0000 | ORAL_TABLET | Freq: Three times a day (TID) | ORAL | 0 refills | Status: DC | PRN

## 2023-05-30 MED ORDER — CYCLOBENZAPRINE HCL 10 MG PO TABS: 10.0000 mg | ORAL_TABLET | Freq: Two times a day (BID) | ORAL | 0 refills | Status: DC | PRN

## 2023-05-30 NOTE — Telephone Encounter (Signed)
 Patient called and needs a  refill on muscle relaxer. CB#[phone]

## 2023-05-30 NOTE — Telephone Encounter (Signed)
 Last Fill: 04/29/23 90 tabs/0 RF  Last OV: 04/19/23 Next OV: 07/17/23  Routing to provider for review/authorization.

## 2023-05-30 NOTE — Telephone Encounter (Signed)
 Copied from CRM [id number]. Topic: Clinical - Medication Refill >> May 30, 2023  2:42 PM Corin V wrote: Most Recent Primary Care Visit:  Provider: Annett Fabian  Department: [facility]  Visit Type: OPEN ESTABLISHED  Date: 04/19/2023  Medication: oxyCODONE-acetaminophen (PERCOCET) 10-325 MG tablet [[id number]]  Has the patient contacted their pharmacy? Yes (Agent: If no, request that the patient contact the pharmacy for the refill. If patient does not wish to contact the pharmacy document the reason why and proceed with request.) (Agent: If yes, when and what did the pharmacy advise?)  Is this the correct pharmacy for this prescription? Yes If no, delete pharmacy and type the correct one.  This is the patient's preferred pharmacy:  [id number] - [place], [place] - [address] AT [facility] OF [address] [place] [address] Phone: [phone] Fax: [phone]  Has the prescription been filled recently? No  Is the patient out of the medication? No  Has the patient been seen for an appointment in the last year OR does the patient have an upcoming appointment? Yes  Can we respond through MyChart? No  Agent: Please be advised that Rx refills may take up to 3 business days. We ask that you follow-up with your pharmacy.

## 2023-06-04 ENCOUNTER — Other Ambulatory Visit: Payer: Self-pay

## 2023-06-04 ENCOUNTER — Encounter (HOSPITAL_COMMUNITY): Payer: Self-pay

## 2023-06-04 ENCOUNTER — Emergency Department (HOSPITAL_COMMUNITY)

## 2023-06-04 ENCOUNTER — Emergency Department (HOSPITAL_COMMUNITY)
Admission: EM | Admit: 2023-06-04 | Discharge: 2023-06-05 | Disposition: A | Discharge status: Home / Self Care | Attending: Emergency Medicine | Admitting: Emergency Medicine

## 2023-06-04 DIAGNOSIS — Z79899 Other long term (current) drug therapy: Secondary | ICD-10-CM | POA: Insufficient documentation

## 2023-06-04 DIAGNOSIS — R0789 Other chest pain: Secondary | ICD-10-CM | POA: Diagnosis not present

## 2023-06-04 DIAGNOSIS — I10 Essential (primary) hypertension: Secondary | ICD-10-CM | POA: Diagnosis not present

## 2023-06-04 DIAGNOSIS — R519 Headache, unspecified: Secondary | ICD-10-CM | POA: Insufficient documentation

## 2023-06-04 DIAGNOSIS — N183 Chronic kidney disease, stage 3 unspecified: Secondary | ICD-10-CM | POA: Insufficient documentation

## 2023-06-04 DIAGNOSIS — R079 Chest pain, unspecified: Secondary | ICD-10-CM | POA: Diagnosis not present

## 2023-06-04 DIAGNOSIS — I129 Hypertensive chronic kidney disease with stage 1 through stage 4 chronic kidney disease, or unspecified chronic kidney disease: Secondary | ICD-10-CM | POA: Insufficient documentation

## 2023-06-04 DIAGNOSIS — Z7901 Long term (current) use of anticoagulants: Secondary | ICD-10-CM | POA: Diagnosis not present

## 2023-06-04 DIAGNOSIS — R9431 Abnormal electrocardiogram [ECG] [EKG]: Secondary | ICD-10-CM | POA: Diagnosis not present

## 2023-06-04 DIAGNOSIS — Z91010 Allergy to peanuts: Secondary | ICD-10-CM | POA: Insufficient documentation

## 2023-06-04 DIAGNOSIS — I2510 Atherosclerotic heart disease of native coronary artery without angina pectoris: Secondary | ICD-10-CM | POA: Diagnosis not present

## 2023-06-04 DIAGNOSIS — I700 Atherosclerosis of aorta: Secondary | ICD-10-CM | POA: Diagnosis not present

## 2023-06-04 DIAGNOSIS — G4489 Other headache syndrome: Secondary | ICD-10-CM | POA: Diagnosis not present

## 2023-06-04 DIAGNOSIS — Z7982 Long term (current) use of aspirin: Secondary | ICD-10-CM | POA: Insufficient documentation

## 2023-06-04 DIAGNOSIS — E785 Hyperlipidemia, unspecified: Secondary | ICD-10-CM | POA: Insufficient documentation

## 2023-06-04 DIAGNOSIS — G4733 Obstructive sleep apnea (adult) (pediatric): Secondary | ICD-10-CM | POA: Insufficient documentation

## 2023-06-04 DIAGNOSIS — I6523 Occlusion and stenosis of bilateral carotid arteries: Secondary | ICD-10-CM | POA: Diagnosis not present

## 2023-06-04 LAB — CBC
HCT: 38.2 % (ref 36.0–46.0)
Hemoglobin: 12.8 g/dL (ref 12.0–15.0)
MCH: 30.5 pg (ref 26.0–34.0)
MCHC: 33.5 g/dL (ref 30.0–36.0)
MCV: 91.2 fL (ref 80.0–100.0)
Platelets: 243 10*3/uL (ref 150–400)
RBC: 4.19 MIL/uL (ref 3.87–5.11)
RDW: 13.6 % (ref 11.5–15.5)
WBC: 6.4 10*3/uL (ref 4.0–10.5)
nRBC: 0 % (ref 0.0–0.2)

## 2023-06-04 LAB — COMPREHENSIVE METABOLIC PANEL WITH GFR
ALT: 12 U/L (ref 0–44)
AST: 23 U/L (ref 15–41)
Albumin: 4.2 g/dL (ref 3.5–5.0)
Alkaline Phosphatase: 118 U/L (ref 38–126)
Anion gap: 13 (ref 5–15)
BUN: 31 mg/dL — ABNORMAL HIGH (ref 8–23)
CO2: 21 mmol/L — ABNORMAL LOW (ref 22–32)
Calcium: 9 mg/dL (ref 8.9–10.3)
Chloride: 99 mmol/L (ref 98–111)
Creatinine, Ser: 1.8 mg/dL — ABNORMAL HIGH (ref 0.44–1.00)
GFR, Estimated: 30 mL/min — ABNORMAL LOW (ref >60)
Glucose, Bld: 103 mg/dL — ABNORMAL HIGH (ref 70–99)
Potassium: 3.9 mmol/L (ref 3.5–5.1)
Sodium: 133 mmol/L — ABNORMAL LOW (ref 135–145)
Total Bilirubin: 1.3 mg/dL — ABNORMAL HIGH (ref 0.0–1.2)
Total Protein: 8.8 g/dL — ABNORMAL HIGH (ref 6.5–8.1)

## 2023-06-04 LAB — TROPONIN I (HIGH SENSITIVITY)
Troponin I (High Sensitivity): 16 ng/L (ref <18)
Troponin I (High Sensitivity): 14 ng/L (ref <18)

## 2023-06-04 LAB — BRAIN NATRIURETIC PEPTIDE: B Natriuretic Peptide: 108.6 pg/mL — ABNORMAL HIGH (ref 0.0–100.0)

## 2023-06-04 MED ORDER — OXYCODONE-ACETAMINOPHEN 5-325 MG PO TABS
2.0000 | ORAL_TABLET | Freq: Once | ORAL | Status: AC
  Administered 2023-06-04: 2 via ORAL
  Filled 2023-06-04: qty 2

## 2023-06-04 MED ORDER — DIPHENHYDRAMINE HCL 25 MG PO CAPS
25.0000 mg | ORAL_CAPSULE | Freq: Once | ORAL | Status: AC
  Administered 2023-06-04: 25 mg via ORAL
  Filled 2023-06-04: qty 1

## 2023-06-04 MED ORDER — HYDRALAZINE HCL 25 MG PO TABS
100.0000 mg | ORAL_TABLET | Freq: Once | ORAL | Status: AC
  Administered 2023-06-04: 100 mg via ORAL
  Filled 2023-06-04: qty 4

## 2023-06-04 MED ORDER — NITROGLYCERIN 0.4 MG SL SUBL
0.4000 mg | SUBLINGUAL_TABLET | Freq: Once | SUBLINGUAL | Status: AC
  Administered 2023-06-04: 0.4 mg via SUBLINGUAL
  Filled 2023-06-04: qty 1

## 2023-06-04 NOTE — ED Notes (Signed)
 Assisted patient to and from the bedside commode at this time

## 2023-06-04 NOTE — ED Provider Notes (Signed)
 [facility] AT [facility] Provider Note   CSN: [id number] Arrival date & time: 06/04/23  1729     History  Chief Complaint  Patient presents with   Hypertension    Vanessa Palmer is a 73 y.o. female.  Overall past medical history of hyperlipidemia, hypertension, coronary artery disease, stage III kidney disease obstructive sleep apnea presented to emergency room with complaint of hypertension.  Patient reports that for the last 2 days her blood pressures have been running in the 200s systolic.  She reports that yesterday she started experiencing a mild headache and it is located all over her head.  Reports she took a pain pill and this temporarily improved her headache but started again today.  She denies any blurry vision change in vision confusion or focal deficit.  Patient also reports that yesterday evening she started experiencing left-sided chest pain that sharp and shooting and radiating to her left arm. Today chest pain reoccurred, it came back about 3 hours prior to coming to ER when she was resting in bed. Chest pain is again sharp and radiating to left arm. It is not pleuritic or positional.   Last took her blood pressure medication this morning.  Denies abdominal pain nausea vomiting or diarrhea.   Hypertension Associated symptoms include chest pain and headaches.       Home Medications Prior to Admission medications   Medication Sig Start Date End Date Taking? Authorizing Provider  amLODipine (NORVASC) 10 MG tablet Take 1 tablet (10 mg total) by mouth daily. 11/14/22   Rosezetta Contras, MD  aspirin EC 81 MG tablet Take 81 mg by mouth daily. Swallow whole.    [provider]  atorvastatin (LIPITOR) 40 MG tablet Take 1 tablet (40 mg total) by mouth daily. 11/14/22   Rosezetta Contras, MD  budesonide-formoterol (SYMBICORT) 160-4.5 MCG/ACT inhaler Inhale 2 puffs into the lungs 3 times/day as needed-between meals & bedtime. 07/27/22   Rosezetta Contras, MD  cyclobenzaprine (FLEXERIL) 10 MG tablet Take 1 tablet (10 mg total) by mouth 2 (two) times daily as needed for muscle spasms. 05/30/23   Arnie Lao, MD  EPINEPHrine 0.3 mg/0.3 mL IJ SOAJ injection Inject 0.3 mg into the muscle as needed for anaphylaxis. 10/05/22   Rosezetta Contras, MD  gabapentin (NEURONTIN) 300 MG capsule Take 1 capsule (300 mg total) by mouth 3 (three) times daily. 11/14/22   Rosezetta Contras, MD  hydrALAZINE (APRESOLINE) 50 MG tablet Take 2 tablets (100 mg total) by mouth in the morning and at bedtime. 10/05/22   Rosezetta Contras, MD  hydrOXYzine (ATARAX) 25 MG tablet Take 25 mg by mouth every 6 (six) hours as needed for itching.    [provider]  losartan-hydrochlorothiazide (HYZAAR) 50-12.5 MG tablet Take 1 tablet by mouth daily. 11/21/22   [provider]  metoprolol succinate (TOPROL-XL) 50 MG 24 hr tablet TAKE 1 TABLET(50 MG) BY MOUTH DAILY 08/22/22   Rosezetta Contras, MD  nitroGLYCERIN (NITROSTAT) 0.4 MG SL tablet Place 1 tablet (0.4 mg total) under the tongue every 5 (five) minutes as needed for chest pain. 03/11/20   Rosezetta Contras, MD  oxyCODONE-acetaminophen (PERCOCET) 10-325 MG tablet Take 1 tablet by mouth every 8 (eight) hours as needed for pain (chronic arthritis pain, multiple sites, diag code M16.11, M25.50). 05/30/23 06/29/23  Priscella Brooms, DO  pantoprazole (PROTONIX) 40 MG tablet Take 1 tablet (40 mg total) by mouth daily. 01/31/23  Prosperi, Christian H, PA-C  potassium chloride SA (KLOR-CON M) 20 MEQ tablet Take 20 mEq by mouth daily. 09/27/22   [provider]  rivaroxaban (XARELTO) 20 MG TABS tablet TAKE ONE TABLET BY MOUTH DAILY WITH SUPPER 09/04/22   McLendon, Michael, MD  sertraline (ZOLOFT) 100 MG tablet Take 1 tablet (100 mg total) by mouth daily. 04/19/23 04/18/24  Justus, Michael, MD  sucralfate (CARAFATE) 1 g tablet Take 1 tablet (1 g total) by mouth 4 (four) times daily -  with meals and at bedtime. 01/31/23    Prosperi, Christian H, PA-C  tolterodine (DETROL LA) 2 MG 24 hr capsule TAKE 1 CAPSULE(2 MG) BY MOUTH DAILY 08/22/22   Rosezetta Contras, MD  torsemide (DEMADEX) 20 MG tablet Take 20 mg by mouth 2 (two) times daily. 09/27/22   [provider]      Allergies    Ace inhibitors, Other, Regadenoson, Shrimp [shellfish allergy], Peanut oil, Hydrocodone-acetaminophen, Hydromorphone hcl, Oxycodone-acetaminophen, Propoxyphene n-acetaminophen, and Vicodin [hydrocodone-acetaminophen]    Review of Systems   Review of Systems  Cardiovascular:  Positive for chest pain.  Neurological:  Positive for headaches.    Physical Exam Updated Vital Signs BP (!) 213/109 (BP Location: Left Arm)   Pulse 63   Temp 98 F (36.7 C) (Oral)   Resp 18   Ht 5\' 2"  (1.575 m)   Wt (!) 138.6 kg   SpO2 100%   BMI 55.89 kg/m  Physical Exam Vitals and nursing note reviewed.  Constitutional:      General: She is not in acute distress.    Appearance: She is not toxic-appearing.  HENT:     Head: Normocephalic and atraumatic.  Eyes:     General: No scleral icterus.    Conjunctiva/sclera: Conjunctivae normal.  Cardiovascular:     Rate and Rhythm: Normal rate and regular rhythm.     Pulses: Normal pulses.     Heart sounds: Normal heart sounds.  Pulmonary:     Effort: Pulmonary effort is normal. No respiratory distress.     Breath sounds: Normal breath sounds.  Chest:     Chest wall: No tenderness.  Abdominal:     General: Abdomen is flat. Bowel sounds are normal.     Palpations: Abdomen is soft.     Tenderness: There is no abdominal tenderness.  Musculoskeletal:     Right lower leg: No edema.     Left lower leg: No edema.  Skin:    General: Skin is warm and dry.     Findings: No lesion.  Neurological:     General: No focal deficit present.     Mental Status: She is alert and oriented to person, place, and time. Mental status is at baseline.     ED Results / Procedures / Treatments   Labs (all labs  ordered are listed, but only abnormal results are displayed) Labs Reviewed  COMPREHENSIVE METABOLIC PANEL WITH GFR - Abnormal; Notable for the following components:      Result Value   Sodium 133 (*)    CO2 21 (*)    Glucose, Bld 103 (*)    BUN 31 (*)    Creatinine, Ser 1.80 (*)    Total Protein 8.8 (*)    Total Bilirubin 1.3 (*)    GFR, Estimated 30 (*)    All other components within normal limits  BRAIN NATRIURETIC PEPTIDE - Abnormal; Notable for the following components:   B Natriuretic Peptide 108.6 (*)    All other  components within normal limits  CBC  TROPONIN I (HIGH SENSITIVITY)  TROPONIN I (HIGH SENSITIVITY)    EKG EKG Interpretation Date/Time:  Tuesday June 04 2023 17:43:12 EDT Ventricular Rate:  63 PR Interval:  180 QRS Duration:  95 QT Interval:  435 QTC Calculation: 446 R Axis:   35  Text Interpretation: Sinus rhythm Low voltage, precordial leads Minimal ST elevation, inferior leads Baseline wander in lead(s) V1 Confirmed by Rafael Bun [phone]) on 06/04/2023 6:45:26 PM  Radiology CT Head Wo Contrast Result Date: 06/04/2023 CLINICAL DATA:  Headache, increasing frequency or severity EXAM: CT HEAD WITHOUT CONTRAST TECHNIQUE: Contiguous axial images were obtained from the base of the skull through the vertex without intravenous contrast. RADIATION DOSE REDUCTION: This exam was performed according to the departmental dose-optimization program which includes automated exposure control, adjustment of the mA and/or kV according to patient size and/or use of iterative reconstruction technique. COMPARISON:  CT head 11/28/2018 FINDINGS: Brain: Patchy and confluent areas of decreased attenuation are noted throughout the deep and periventricular white matter of the cerebral hemispheres bilaterally, compatible with chronic microvascular ischemic disease. No evidence of large-territorial acute infarction. No parenchymal hemorrhage. No mass lesion. No extra-axial collection. No mass  effect or midline shift. No hydrocephalus. Basilar cisterns are patent. Vascular: No hyperdense vessel. Atherosclerotic calcifications are present within the cavernous internal carotid arteries. Skull: No acute fracture or focal lesion. Sinuses/Orbits: Paranasal sinuses and mastoid air cells are clear. The orbits are unremarkable. Other: None. IMPRESSION: No acute intracranial abnormality. Electronically Signed   By: Morgane  Naveau M.D.   On: 06/04/2023 22:41   DG Chest 2 View Result Date: 06/04/2023 CLINICAL DATA:  CP EXAM: CHEST - 2 VIEW COMPARISON:  Chest x-ray 01/31/2023 FINDINGS: The heart and mediastinal contours are unchanged. Atherosclerotic plaque. No focal consolidation. No pulmonary edema. No pleural effusion. No pneumothorax. No acute osseous abnormality. IMPRESSION: 1. No active cardiopulmonary disease. 2.  Aortic Atherosclerosis (ICD10-I70.0). Electronically Signed   By: Morgane  Naveau M.D.   On: 06/04/2023 22:38    Procedures Procedures    Medications Ordered in ED Medications - No data to display  ED Course/ Medical Decision Making/ A&P Clinical Course as of 06/04/23 2146  Tue Jun 04, 2023  2041 BP 170/100 after nitro [JB]    Clinical Course User Index [JB] Adaliah Hiegel, Kandace Organ, PA-C                                 Medical Decision Making Amount and/or Complexity of Data Reviewed Labs: ordered. Radiology: ordered.  Risk Prescription drug management.   This patient presents to the ED for concern of HTN, this involves an extensive number of treatment options, and is a complaint that carries with it a high risk of complications and morbidity.  The differential diagnosis includes ACS, HTN emergency, HTN urgency, pneumonia, noncompliance    Co morbidities that complicate the patient evaluation  Hyperlipidemia, hypertension, coronary artery disease, stage III kidney disease obstructive sleep apnea   Additional history obtained:  Additional history obtained from  01/31/23, last echo 07/20/19 EF 60-65%   Lab Tests:  I personally interpreted labs.  The pertinent results include:   CBC with leukocytosis and anemia.  CMP appears baseline creatinine of 1.8 and GFR of 30 Troponin is 16, repeat troponin is pending BNP is elevated at 108... no recent echo   Imaging Studies ordered:  I ordered imaging studies including CT head, chest x-ray  I independently visualized and interpreted imaging which showed pending.  I agree with the radiologist interpretation   Cardiac Monitoring: / EKG:  The patient was maintained on a cardiac monitor.  I personally viewed and interpreted the cardiac monitored which showed an underlying rhythm of:  sinus with ST changes   Problem List / ED Course / Critical interventions / Medication management  Patient presenting to emergency room with complaint of high blood pressure.  She does have associated chest pain which is radiating to her left extremity.  Reports she has taken her blood pressure medicine and also reports headache.  Headache was not sudden onset or severe.  Reports no change in vision.  She has nonfocal neurological exam.  She is hypertensive with 190 systolic in room.  Plan is  EKG, head CT and labs.  Will order serial troponin. So far patient's lab work is significant for troponin of 16 she will need repeat troponin.  Her EKG does show some ST changes in the inferior leads.  Her BNP is elevated at 108 and her blood pressure has responded well to 0.4 mg of sublingual nitro.  She reports her chest pain and headache have mildly improved. Has suspicious story, feel she will benefit from admission to obs.  I ordered medication including nitro Reevaluation of the patient after these medicines showed that the patient improved I have reviewed the patients home medicines and have made adjustments as needed   Plan  Signed off pending CT head, Chest x-ray and repeat troponin.          Final Clinical  Impression(s) / ED Diagnoses Final diagnoses:  Chest pain, unspecified type    Rx / DC Orders ED Discharge Orders     None         Eudora Heron, PA-C 06/04/23 2323    Sallyanne Creamer, DO 06/10/23 1202

## 2023-06-04 NOTE — ED Notes (Signed)
 Nurse to room to lab and medicate but patient in CT at this time.

## 2023-06-04 NOTE — ED Triage Notes (Signed)
 Arrived with EMS. Reports hx of HTN. States taking medication and complaint. However has had a headache the last few days and despite taking meds BP has remained high. No other symptoms.

## 2023-06-05 ENCOUNTER — Ambulatory Visit: Payer: Self-pay

## 2023-06-05 NOTE — Discharge Instructions (Signed)
 Your chest pain workup was overall reassuring today.  As discussed, please contact your cardiologist tomorrow to discuss your blood pressure control and to discuss your recent episode of chest pain.  If your chest pain returns or you become short of breath, or you develop other life-threatening symptoms please return to the emergency department for further evaluation.  You may resume your home medications as prescribed.

## 2023-06-05 NOTE — Telephone Encounter (Signed)
 Chief Complaint: Elevated blood pressure  Symptoms: BP 175/105 P-77 Frequency: today  Pertinent Negatives: Patient denies headache, chest pain, SOB, numbness, weakness Disposition: [] ED /[] Urgent Care (no appt availability in office) / [x] Appointment(In office/virtual)/ []  [facility]/ [] Home Care/ [] Refused Recommended Disposition /[]  Mobile Bus/ []  Follow-up with PCP Additional Notes: Patient states she was seen at [facility] ED yesterday for chest pain and treated for elevated blood pressure. Was advised to follow up with PCP. Patient denies chest pain today and other cardiac symptoms when asked. Care advice was given and patient has been scheduled for hospital follow up 06/11/23. Copied from CRM [id number]. Topic: Clinical - Red Word Triage >> Jun 05, 2023  1:59 PM Adrianna P wrote: Red Word that prompted transfer to Nurse Triage: patient bp yesterday was 170/116 Reason for Disposition  Systolic BP  >= 160 OR Diastolic >= 100  Answer Assessment - Initial Assessment Questions 1. BLOOD PRESSURE: "What is the blood pressure?" "Did you take at least two measurements 5 minutes apart?"     170/116 yesterday,175/107 P 77 2. ONSET: "When did you take your blood pressure?"     Yesterday  3. HOW: "How did you take your blood pressure?" (e.g., automatic home BP monitor, visiting nurse)     Home PB monitor  4. HISTORY: "Do you have a history of high blood pressure?"     Yes  5. MEDICINES: "Are you taking any medicines for blood pressure?" "Have you missed any doses recently?"     Yes, I take my medication like it is ordered  6. OTHER SYMPTOMS: "Do you have any symptoms?" (e.g., blurred vision, chest pain, difficulty breathing, headache, weakness)     No 7. PREGNANCY: "Is there any chance you are pregnant?" "When was your last menstrual period?"     N/A  Protocols used: Blood Pressure - High-A-AH

## 2023-06-05 NOTE — ED Provider Notes (Signed)
  Physical Exam  BP (!) 161/113   Pulse 66   Temp 97.9 F (36.6 C) (Oral)   Resp 20   Ht 5\' 2"  (1.575 m)   Wt (!) 138.6 kg   SpO2 100%   BMI 55.89 kg/m   Physical Exam  Procedures  Procedures  ED Course / MDM   Clinical Course as of 06/05/23 0218  Tue Jun 04, 2023  2041 BP 170/100 after nitro [JB]    Clinical Course User Index [JB] Barrett, Horald Chestnut, PA-C   Medical Decision Making Amount and/or Complexity of Data Reviewed Labs: ordered. Radiology: ordered.  Risk Prescription drug management.   Patient care assumed from previous provider at shift handoff.  See her note for full details.  In short, patient with past medical history significant for hypertension, coronary artery disease, stage III kidney disease, OSA, presented to the emergency room complaining of hypertension.  She states that she has had elevated blood pressures for the past 2 days with systolics in the 200 range.  She also endorsed a mild headache which began yesterday which is generalized in nature.  She takes chronic pain medication and states that her Percocet did improve her headache.  She denies any vision change or focal neurologic deficit.  Patient also endorsed some mild left-sided chest pain radiating to left arm which is not positional in nature.  Plan to follow-up on patient's imaging and reassess patient for disposition decision. No acute findings on CT head or chest x-ray.  Patient was given her nightly dose of hydralazine, a dose of her home pain medication, and reassessed.  Blood pressure improved to 161/113.  She no longer complained of chest pain.  Her chief complaint was chronic right knee pain which is currently followed by orthopedic surgery with plans for possible knee replacement in the future but patient currently has elevated BMI which makes surgery higher risk at this time.  I discussed admission versus discharge home with patient and patient feels comfortable discharge home at this  time.  With patient's chest pain completely resolved, negative troponins x 2, a reassuring EKG, I do not feel that she requires admission at this time.  Her BNP was mildly elevated at 108.6 but she does not appear to clinically be fluid overloaded and there were no signs of fluid overload on chest x-ray.  Lungs are clear to auscultation.  No significant dependent edema.  Patient feels comfortable discharge home and plans to follow-up with her cardiologist to discuss her blood pressure control and her episode of chest pain.  Strict return precautions provided including increasing chest pain, shortness of breath.  Patient will resume her home blood pressure regimen.        Pamala Duffel 06/05/23 0454    Nira Conn, MD 06/05/23 2007

## 2023-06-05 NOTE — ED Notes (Signed)
 Patient d/c with home care instructions. IV discontinued.

## 2023-06-06 ENCOUNTER — Telehealth: Payer: Self-pay

## 2023-06-06 NOTE — Transitions of Care (Post Inpatient/ED Visit) (Signed)
 06/06/2023  Name: Vanessa Palmer MRN: [id number] DOB: Feb 13, 1951  Today's TOC FU Call Status: Today's TOC FU Call Status:: Successful TOC FU Call Completed TOC FU Call Complete Date: 06/06/23 Patient's Name and Date of Birth confirmed.  Transition Care Management Follow-up Telephone Call Date of Discharge: 06/05/23 Discharge Facility: [facility]) Type of Discharge: Emergency Department Reason for ED Visit: Other:, Cardiac Conditions Cardiac Conditions Diagnosis: Chest Pain Persisting How have you been since you were released from the hospital?: Better Any questions or concerns?: No  Items Reviewed: Did you receive and understand the discharge instructions provided?: Yes Medications obtained,verified, and reconciled?: Yes (Medications Reviewed) Any new allergies since your discharge?: No Dietary orders reviewed?: NA Do you have support at home?: Yes  Medications Reviewed Today: Medications Reviewed Today     Reviewed by Leigh Aurora, CMA (Certified Medical Assistant) on 06/06/23 at 1447  Med List Status: <None>   Medication Order Taking? Sig Documenting Provider Last Dose Status Informant  amLODipine (NORVASC) 10 MG tablet [id number] No Take 1 tablet (10 mg total) by mouth daily. Inez Catalina, MD 01/30/2023 Active   aspirin EC 81 MG tablet [id number] No Take 81 mg by mouth daily. Swallow whole. [provider] 01/30/2023 Active Self, Family Member, Pharmacy Records  atorvastatin (LIPITOR) 40 MG tablet [id number] No Take 1 tablet (40 mg total) by mouth daily. Inez Catalina, MD 01/30/2023 Active   budesonide-formoterol (SYMBICORT) 160-4.5 MCG/ACT inhaler [id number] No Inhale 2 puffs into the lungs 3 times/day as needed-between meals & bedtime. Inez Catalina, MD 01/30/2023 Active   cyclobenzaprine (FLEXERIL) 10 MG tablet [id number]  Take 1 tablet (10 mg total) by mouth 2 (two) times daily as needed for muscle spasms. Kathryne Hitch, MD  Active    EPINEPHrine 0.3 mg/0.3 mL IJ SOAJ injection [id number]  Inject 0.3 mg into the muscle as needed for anaphylaxis. Inez Catalina, MD  Active            Med Note Leia Alf, [facility]   Thu Jan 31, 2023 12:53 PM) prn  gabapentin (NEURONTIN) 300 MG capsule [id number] No Take 1 capsule (300 mg total) by mouth 3 (three) times daily. Inez Catalina, MD 01/30/2023 Active   hydrALAZINE (APRESOLINE) 50 MG tablet [id number] No Take 2 tablets (100 mg total) by mouth in the morning and at bedtime. Inez Catalina, MD 01/30/2023 Active   hydrOXYzine (ATARAX) 25 MG tablet [id number] No Take 25 mg by mouth every 6 (six) hours as needed for itching. [provider] Past Week Active   losartan-hydrochlorothiazide (HYZAAR) 50-12.5 MG tablet [id number] No Take 1 tablet by mouth daily. [provider] 01/30/2023 Active   metoprolol succinate (TOPROL-XL) 50 MG 24 hr tablet [id number] No TAKE 1 TABLET(50 MG) BY MOUTH DAILY Inez Catalina, MD 01/30/2023 Active   nitroGLYCERIN (NITROSTAT) 0.4 MG SL tablet [id number] No Place 1 tablet (0.4 mg total) under the tongue every 5 (five) minutes as needed for chest pain. Inez Catalina, MD Taking Active Self, Family Member, Pharmacy Records           Med Note Leia Alf, [place]   Thu Jan 31, 2023 12:54 PM) Out of med  oxyCODONE-acetaminophen [facility]) 10-325 MG tablet [id number]  Take 1 tablet by mouth every 8 (eight) hours as needed for pain (chronic arthritis pain, multiple sites, diag code M16.11, M25.50). Gust Rung, DO  Active   pantoprazole (PROTONIX) 40 MG tablet [id number]  Take 1 tablet (40 mg total) by mouth  daily. Prosperi, Christian H, PA-C  Active   potassium chloride SA (KLOR-CON M) 20 MEQ tablet [id number]  Take 20 mEq by mouth daily. [provider]  Active            Med Note Ila Mcgill   Thu Jan 31, 2023 12:55 PM) Out of med  rivaroxaban (XARELTO) 20 MG TABS tablet [id number] No TAKE ONE TABLET BY MOUTH DAILY WITH SUPPER Marrianne Mood,  MD 01/30/2023 Active   sertraline (ZOLOFT) 100 MG tablet [id number]  Take 1 tablet (100 mg total) by mouth daily. Annett Fabian, MD  Active   sucralfate (CARAFATE) 1 g tablet [id number]  Take 1 tablet (1 g total) by mouth 4 (four) times daily -  with meals and at bedtime. Prosperi, Christian H, PA-C  Active   tolterodine (DETROL LA) 2 MG 24 hr capsule [id number] No TAKE 1 CAPSULE(2 MG) BY MOUTH DAILY Inez Catalina, MD 01/30/2023 Active   torsemide (DEMADEX) 20 MG tablet [id number] No Take 20 mg by mouth 2 (two) times daily. [provider] 01/30/2023 Active             Home Care and Equipment/Supplies: Were Home Health Services Ordered?: NA Any new equipment or medical supplies ordered?: NA  Functional Questionnaire: Do you need assistance with bathing/showering or dressing?: No Do you need assistance with meal preparation?: No Do you need assistance with eating?: No Do you have difficulty maintaining continence: No Do you need assistance with getting out of bed/getting out of a chair/moving?: No Do you have difficulty managing or taking your medications?: No  Follow up appointments reviewed: PCP Follow-up appointment confirmed?: Yes Date of PCP follow-up appointment?: 06/11/23 Follow-up Provider: Champ Mungo, DO [facility] Follow-up appointment confirmed?: NA Do you need transportation to your follow-up appointment?: No Do you understand care options if your condition(s) worsen?: Yes-patient verbalized understanding    SIGNATURE Agnes Lawrence, CMA (AAMA)  [facility] [phone]

## 2023-06-11 ENCOUNTER — Ambulatory Visit: Payer: Self-pay | Admitting: Internal Medicine

## 2023-06-11 VITALS — BP 123/64 | HR 59 | Temp 98.0°F | Ht 62.0 in | Wt 279.4 lb

## 2023-06-11 DIAGNOSIS — R32 Unspecified urinary incontinence: Secondary | ICD-10-CM

## 2023-06-11 DIAGNOSIS — I10 Essential (primary) hypertension: Secondary | ICD-10-CM | POA: Diagnosis not present

## 2023-06-11 DIAGNOSIS — E785 Hyperlipidemia, unspecified: Secondary | ICD-10-CM

## 2023-06-11 DIAGNOSIS — I2089 Other forms of angina pectoris: Secondary | ICD-10-CM

## 2023-06-11 DIAGNOSIS — G4733 Obstructive sleep apnea (adult) (pediatric): Secondary | ICD-10-CM

## 2023-06-11 DIAGNOSIS — I82403 Acute embolism and thrombosis of unspecified deep veins of lower extremity, bilateral: Secondary | ICD-10-CM

## 2023-06-11 MED ORDER — TOLTERODINE TARTRATE ER 2 MG PO CP24: 2.0000 mg | ORAL_CAPSULE | Freq: Every day | ORAL | 3 refills | Status: DC

## 2023-06-11 MED ORDER — ASPIRIN 81 MG PO TBEC: 81.0000 mg | DELAYED_RELEASE_TABLET | Freq: Every day | ORAL | 2 refills | Status: DC

## 2023-06-11 MED ORDER — AMLODIPINE BESYLATE 10 MG PO TABS: 10.0000 mg | ORAL_TABLET | Freq: Every day | ORAL | 3 refills | Status: DC

## 2023-06-11 MED ORDER — LOSARTAN POTASSIUM-HCTZ 50-12.5 MG PO TABS: 1.0000 | ORAL_TABLET | Freq: Every day | ORAL | 0 refills | Status: DC

## 2023-06-11 MED ORDER — RIVAROXABAN 20 MG PO TABS: ORAL_TABLET | ORAL | 3 refills | Status: AC

## 2023-06-11 MED ORDER — ATORVASTATIN CALCIUM 40 MG PO TABS: 40.0000 mg | ORAL_TABLET | Freq: Every day | ORAL | 3 refills | Status: AC

## 2023-06-11 MED ORDER — METOPROLOL SUCCINATE ER 50 MG PO TB24: 50.0000 mg | ORAL_TABLET | Freq: Every day | ORAL | 3 refills | Status: DC

## 2023-06-11 NOTE — Assessment & Plan Note (Signed)
 She does not use her CPAP; she explains that after a move several years ago, a part was missing and she did not follow up to get the part replaced. Plan:I will place a referral to sleep medicine so that she can hopefully get another CPAP as I suspect this is contributing to her hypertension and she needs to have this replaced regardless.

## 2023-06-11 NOTE — Assessment & Plan Note (Signed)
 Today, BP is elevated to 133/116, on recheck is 123/64. OP regimen includes amlodipine 10 mg daily, hydralazine 100 mg BID, losartan-hydrochlorothiazide 50-12.5 mg daily, metoprolol 50 mg daily, torsemide 20 mg daily; she has taken her medications today but notes she does not take her torsemide every day. She denies trouble breathing, extra extremity edema from baseline. Plan:Continue current regimen.

## 2023-06-11 NOTE — Progress Notes (Signed)
 CC: HFU  HPI:  [name] is a 73 y.o. female with past medical history as detailed below who presents for HFU where she was seen for HTN, HA, and chest pain. Please see problem based charting for detailed assessment and plan.  Past Medical History:  Diagnosis Date   ALCOHOL ABUSE 12/13/2005   Annotation: Sober since 11/06 Qualifier: Diagnosis of  By: Wallace Cullens MD, Natalia Leatherwood     Anemia    Arthritis    "all over" (02/21/2017)   CAD (coronary artery disease)    s/p non-Q wave MI in 06/2001 and 2004, 2005   CHF (congestive heart failure) (HCC)    Chronic kidney disease (CKD), stage III (moderate) (HCC) 09/19/2010   Chronic mid back pain    Depression    DJD (degenerative joint disease)    DVT (deep venous thrombosis) (HCC)    BLE   DVT of lower extremity, bilateral (HCC) 04/04/2012   On Xarelto     GERD (gastroesophageal reflux disease)    Gout    Hyperlipidemia    Hypertension    INTRINSIC ASTHMA, WITH EXACERBATION 09/27/2009   Qualifier: Diagnosis of  By: Denton Meek MD, Nodira     Migraine    "none anymore" (02/21/2017)   Myocardial infarction (HCC) ?2005   Obesity    OSA (obstructive sleep apnea)    previously used CPAP, has lost it (02/21/2017)   Seizures (HCC)    As a teenager    Review of Systems:  Negative unless otherwise stated.  Physical Exam:  Vitals:   06/11/23 1427 06/11/23 1446  BP: (!) 133/116 123/64  Pulse: 60 (!) 59  Temp: 98 F (36.7 C)   TempSrc: Oral   SpO2: 97%   Weight: 279 lb 6.4 oz (126.7 kg)   Height: 5\' 2"  (1.575 m)    Physical Exam Constitutional:      General: She is not in acute distress.    Appearance: She is not ill-appearing.  Cardiovascular:     Rate and Rhythm: Normal rate and regular rhythm.  Pulmonary:     Effort: Pulmonary effort is normal.     Breath sounds: Normal breath sounds.  Musculoskeletal:     Right lower leg: No edema (1+ to ankle joint).     Left lower leg: No edema (1+ to ankle joint).  Skin:    General:  Skin is warm and dry.  Neurological:     General: No focal deficit present.  Psychiatric:        Mood and Affect: Mood normal.        Behavior: Behavior normal.    Assessment & Plan:   See Encounters Tab for problem based charting.  Essential hypertension Today, BP is elevated to 133/116, on recheck is 123/64. OP regimen includes amlodipine 10 mg daily, hydralazine 100 mg BID, losartan-hydrochlorothiazide 50-12.5 mg daily, metoprolol 50 mg daily, torsemide 20 mg daily; she has taken her medications today but notes she does not take her torsemide every day. She denies trouble breathing, extra extremity edema from baseline. Plan:Continue current regimen.  Chronic stable angina [facility]) Patient recently presented to ED for evaluation for various symptoms including elevated BP, HA, and chest pain. The chest pain was described as L-sided, sharp and shooting with radiation to L arm that recurred one day after the initial episode, similar in quality, lasting about 3 hours. She also reported SBP that had been into the 200s and diffuse headache that was responsive to a dose of chronic percocet.  She did not have vision changes or evidence of neurological involvement with any of her symptoms.  Her workup included providing a dose of her home BP medications which improved her BP. She had flat troponins and reassuring EKG. Her BNP was minimally elevated but she was not clinically volume overloaded and CXR was not indicative of volume overload. Her chest pain resolved.  She has had extensive prior CV work-up and previously noted to be intermediate risk. Last echocardiogram in 2021 noted LV EF 60-65% with grade I diastolic dysfunction.  I am concerned that she still reports unstable angina symptoms. Cardiovascular work-up has been limited since 2021. I believe she needs to follow up with her cardiologist as she may need additional stress testing or further CV work-up from prior. She does not recall the last  time she had an OV with them.  She denies recurrence of chest pain since discharge from the hospital. She has a history of both rest and exertional chest pain though notes it is not normally this severe. She is pretty sedentary at baseline. Plan:Recommend follow up with her cardiologist. Would consider addition of SGLT2 inhibitor, GLP-1 receptor antagonist (or both) given renal and cardiovascular dysfunction in the future.  SLEEP APNEA, OBSTRUCTIVE She does not use her CPAP; she explains that after a move several years ago, a part was missing and she did not follow up to get the part replaced. Plan:I will place a referral to sleep medicine so that she can hopefully get another CPAP as I suspect this is contributing to her hypertension and she needs to have this replaced regardless.  Patient discussed with Dr. Jarvis Mesa

## 2023-06-11 NOTE — Assessment & Plan Note (Signed)
 Patient recently presented to ED for evaluation for various symptoms including elevated BP, HA, and chest pain. The chest pain was described as L-sided, sharp and shooting with radiation to L arm that recurred one day after the initial episode, similar in quality, lasting about 3 hours. She also reported SBP that had been into the 200s and diffuse headache that was responsive to a dose of chronic percocet. She did not have vision changes or evidence of neurological involvement with any of her symptoms.  Her workup included providing a dose of her home BP medications which improved her BP. She had flat troponins and reassuring EKG. Her BNP was minimally elevated but she was not clinically volume overloaded and CXR was not indicative of volume overload. Her chest pain resolved.  She has had extensive prior CV work-up and previously noted to be intermediate risk. Last echocardiogram in 2021 noted LV EF 60-65% with grade I diastolic dysfunction.  I am concerned that she still reports unstable angina symptoms. Cardiovascular work-up has been limited since 2021. I believe she needs to follow up with her cardiologist as she may need additional stress testing or further CV work-up from prior. She does not recall the last time she had an OV with them.  She denies recurrence of chest pain since discharge from the hospital. She has a history of both rest and exertional chest pain though notes it is not normally this severe. She is pretty sedentary at baseline. Plan:Recommend follow up with her cardiologist. Would consider addition of SGLT2 inhibitor, GLP-1 receptor antagonist (or both) given renal and cardiovascular dysfunction in the future.

## 2023-06-11 NOTE — Patient Instructions (Signed)
 Ms. Trueheart,  It was a pleasure to care for you today.  I have sent refills of your medications to the pharmacy for you. Make sure that you are taking them as prescribed!  Please follow up with your cardiologist in the next few weeks as well.  My best, Dr. Rozelle Corning

## 2023-06-12 NOTE — Progress Notes (Signed)
 Internal Medicine Clinic Attending  Case discussed with the resident at the time of the visit.  We reviewed the resident's history and exam and pertinent patient test results.  I agree with the assessment, diagnosis, and plan of care documented in the resident's note.

## 2023-06-18 NOTE — Telephone Encounter (Signed)
 Referral has now been sent twice:  Notification History    RecipientAction Referred To ProviderEvent Queued - Document Generating Today12:51:53 PM   Latest Notification   AOZHY86:57:84 PM B Delivery Method  Clinical Attachment Action  Event Queued - Document Generating PCP Type  -- Payer  -- Fax Number  [phone]  Notification History    RecipientAction Referred To ProviderFax Generated 05/28/2509:15:17 AM  Referred To ProviderEvent Queued - Document Generating 05/28/2509:14:45 AM   Latest Notification   05/28/2509:15:17 AM Delivery Method  Clinical Attachment Action  Fax Generated PCP Type  -- Payer  -- Fax Number  [phone]       Copied from CRM [id number]. Topic: Referral - Question >> Jun 18, 2023 12:30 PM Corin V wrote: Reason for CRM: Catharine Clock with [facility] is needing the referral order for home health and insurance information to be faxed to her at [phone]

## 2023-06-22 DIAGNOSIS — M170 Bilateral primary osteoarthritis of knee: Secondary | ICD-10-CM | POA: Diagnosis not present

## 2023-06-22 DIAGNOSIS — I509 Heart failure, unspecified: Secondary | ICD-10-CM | POA: Diagnosis not present

## 2023-06-22 DIAGNOSIS — G8929 Other chronic pain: Secondary | ICD-10-CM | POA: Diagnosis not present

## 2023-06-22 DIAGNOSIS — I25118 Atherosclerotic heart disease of native coronary artery with other forms of angina pectoris: Secondary | ICD-10-CM | POA: Diagnosis not present

## 2023-06-22 DIAGNOSIS — D631 Anemia in chronic kidney disease: Secondary | ICD-10-CM | POA: Diagnosis not present

## 2023-06-22 DIAGNOSIS — I130 Hypertensive heart and chronic kidney disease with heart failure and stage 1 through stage 4 chronic kidney disease, or unspecified chronic kidney disease: Secondary | ICD-10-CM | POA: Diagnosis not present

## 2023-06-22 DIAGNOSIS — N183 Chronic kidney disease, stage 3 unspecified: Secondary | ICD-10-CM | POA: Diagnosis not present

## 2023-06-25 ENCOUNTER — Telehealth: Payer: Self-pay | Admitting: Internal Medicine

## 2023-06-25 NOTE — Telephone Encounter (Signed)
 Copied from CRM [id number]. Topic: Clinical - Home Health Verbal Orders >> Jun 21, 2023 10:12 AM Jayson Michael wrote: Caller/Agency: Ira Mann from [facility] Callback Number: [phone] Service Requested: Physical Therapy They received the provider's order yesterday but cannot see the patient until Sunday. She would like to confirm if this is okay with the provider.   Yes that is fine with me

## 2023-06-26 DIAGNOSIS — M170 Bilateral primary osteoarthritis of knee: Secondary | ICD-10-CM | POA: Diagnosis not present

## 2023-06-26 DIAGNOSIS — N183 Chronic kidney disease, stage 3 unspecified: Secondary | ICD-10-CM | POA: Diagnosis not present

## 2023-06-26 DIAGNOSIS — I130 Hypertensive heart and chronic kidney disease with heart failure and stage 1 through stage 4 chronic kidney disease, or unspecified chronic kidney disease: Secondary | ICD-10-CM | POA: Diagnosis not present

## 2023-06-26 DIAGNOSIS — I25118 Atherosclerotic heart disease of native coronary artery with other forms of angina pectoris: Secondary | ICD-10-CM | POA: Diagnosis not present

## 2023-06-26 DIAGNOSIS — D631 Anemia in chronic kidney disease: Secondary | ICD-10-CM | POA: Diagnosis not present

## 2023-06-26 DIAGNOSIS — G8929 Other chronic pain: Secondary | ICD-10-CM | POA: Diagnosis not present

## 2023-06-26 DIAGNOSIS — I509 Heart failure, unspecified: Secondary | ICD-10-CM | POA: Diagnosis not present

## 2023-06-28 ENCOUNTER — Other Ambulatory Visit: Payer: Self-pay | Admitting: Internal Medicine

## 2023-06-28 DIAGNOSIS — D631 Anemia in chronic kidney disease: Secondary | ICD-10-CM | POA: Diagnosis not present

## 2023-06-28 DIAGNOSIS — M170 Bilateral primary osteoarthritis of knee: Secondary | ICD-10-CM | POA: Diagnosis not present

## 2023-06-28 DIAGNOSIS — I130 Hypertensive heart and chronic kidney disease with heart failure and stage 1 through stage 4 chronic kidney disease, or unspecified chronic kidney disease: Secondary | ICD-10-CM | POA: Diagnosis not present

## 2023-06-28 DIAGNOSIS — N183 Chronic kidney disease, stage 3 unspecified: Secondary | ICD-10-CM | POA: Diagnosis not present

## 2023-06-28 DIAGNOSIS — G8929 Other chronic pain: Secondary | ICD-10-CM | POA: Diagnosis not present

## 2023-06-28 DIAGNOSIS — I25118 Atherosclerotic heart disease of native coronary artery with other forms of angina pectoris: Secondary | ICD-10-CM | POA: Diagnosis not present

## 2023-06-28 DIAGNOSIS — I509 Heart failure, unspecified: Secondary | ICD-10-CM | POA: Diagnosis not present

## 2023-06-28 DIAGNOSIS — M2550 Pain in unspecified joint: Secondary | ICD-10-CM

## 2023-06-28 NOTE — Telephone Encounter (Unsigned)
 Copied from CRM [id number]. Topic: Clinical - Medication Refill >> Jun 28, 2023 11:04 AM [name] F wrote: Most Recent Primary Care Visit:  Provider: Malen Scudder  Department: [facility]  Visit Type: HOSPITAL FOLLOW UP  Date: 06/11/2023  Medication:   oxyCODONE -acetaminophen  (PERCOCET) 10-325 MG tablet  Has the patient contacted their pharmacy? No   Is this the correct pharmacy for this prescription? Yes  This is the patient's preferred pharmacy:  [id number] - [place], [place] - [address] AT [facility] OF [address] [place] [id number] Phone: [phone] Fax: [phone]    Has the prescription been filled recently? No  Is the patient out of the medication? No  Has the patient been seen for an appointment in the last year OR does the patient have an upcoming appointment? Yes  Can we respond through MyChart? No, Patient would like to be called once prescription is placed. Callback Number: [phone]  Agent: Please be advised that Rx refills may take up to 3 business days. We ask that you follow-up with your pharmacy.

## 2023-07-01 ENCOUNTER — Ambulatory Visit: Admitting: Student

## 2023-07-01 VITALS — BP 136/84 | HR 68 | Ht 62.0 in | Wt 272.2 lb

## 2023-07-01 DIAGNOSIS — G4733 Obstructive sleep apnea (adult) (pediatric): Secondary | ICD-10-CM

## 2023-07-01 DIAGNOSIS — M170 Bilateral primary osteoarthritis of knee: Secondary | ICD-10-CM | POA: Diagnosis not present

## 2023-07-01 DIAGNOSIS — Z6841 Body Mass Index (BMI) 40.0 and over, adult: Secondary | ICD-10-CM

## 2023-07-01 DIAGNOSIS — M2550 Pain in unspecified joint: Secondary | ICD-10-CM

## 2023-07-01 MED ORDER — OXYCODONE-ACETAMINOPHEN 10-325 MG PO TABS: ORAL_TABLET | ORAL | 0 refills | Status: DC

## 2023-07-01 NOTE — Assessment & Plan Note (Signed)
 Has a history of severe OA on the bilateral knees that limit mobility. She started PT and has helped. She has been on chronic norco and using it TID instead of PRN. She states that flexeril  helps her fall asleep. Counseled on using flexeril  10mg  at night time and substitute the norco for this. Use norco in the Am and afternoon. Will attempt to wean her off norco. She will benefit from continued weight loss.

## 2023-07-01 NOTE — Assessment & Plan Note (Addendum)
 Has a history of sleep apnea that was severe. On CPAP but her machine broke. She unfortunately has not had a sleep study done since 2020, and will thus need to follow with [facility]. She does have severe obesity with a BMI close to 50. Will benefit from weight loss. Hgb A1c WNL less than a year ago. She incorporated changes to her lifestyle. Has stopped drinking 3 cand of regular pepsi daily as well as gatorade and sunkiss. She also has been walking in her home back and form with her walker and doing exercises at home. She has lost about 28 lbs and is very happy about this. However she needs to loose about 100lbs for bilateral knee replacement (has severe OA). She deneis having a history of medullary thyroid cancer. Her brother does have a history of throat cancer, she will confirm what type this is. I do think she would benefit from starting semaglutide. TSH WNL 8 months ago.  -confirm that brother does not have thyroid cancer and she may benefit from starting semaglutide -cw lifestyle modification  -referral to sleep medicine

## 2023-07-01 NOTE — Telephone Encounter (Signed)
 Last rx written 05/30/23. Last OV - 06/11/23. Next OV - today w/Dr Authur Leghorn. TOX - 09/24/22.

## 2023-07-01 NOTE — Patient Instructions (Signed)
 Thank you, Ms.Pahola D Ashline for allowing us  to provide your care today.   Referrals ordered today:    Referral Orders         Ambulatory referral to Sleep Studies       Follow up:  1 month     Remember:   For your chronic pain:  -Start flexeril  10mg  at night  time INSTEAD of your night time Percocet   For your weight loss: Continue avoiding soda, and sugary drink. Continue with exercise.  Call your brother and make sure he does not have medullary thyroid cancer.   Please let us  know if you do not hear from your appointment with Sleep medicine to follow up on your sleep study.  Should you have any questions or concerns please call the internal medicine clinic at [phone].     Jose Ngo, MD [facility]

## 2023-07-01 NOTE — Progress Notes (Signed)
 CC:  Chief Complaint  Patient presents with   Obesity    HPI:  [name] is a 73 y.o. female living with a history stated below and presents today for follow up on obesity. Please see problem based assessment and plan for additional details.  Past Medical History:  Diagnosis Date   ALCOHOL ABUSE 12/13/2005   Annotation: Sober since 11/06 Qualifier: Diagnosis of  By: Martina Sledge MD, Dana Duncan     Anemia    Arthritis    "all over" (02/21/2017)   CAD (coronary artery disease)    s/p non-Q wave MI in 06/2001 and 2004, 2005   CHF (congestive heart failure) (HCC)    Chronic kidney disease (CKD), stage III (moderate) (HCC) 09/19/2010   Chronic mid back pain    Depression    DJD (degenerative joint disease)    DVT (deep venous thrombosis) (HCC)    BLE   DVT of lower extremity, bilateral (HCC) 04/04/2012   On Xarelto      GERD (gastroesophageal reflux disease)    Gout    Hyperlipidemia    Hypertension    INTRINSIC ASTHMA, WITH EXACERBATION 09/27/2009   Qualifier: Diagnosis of  By: Lyndia Sans MD, Nodira     Migraine    "none anymore" (02/21/2017)   Myocardial infarction (HCC) ?2005   Obesity    OSA (obstructive sleep apnea)    previously used CPAP, has lost it (02/21/2017)   Seizures (HCC)    As a teenager     Current Outpatient Medications on File Prior to Visit  Medication Sig Dispense Refill   amLODipine  (NORVASC ) 10 MG tablet Take 1 tablet (10 mg total) by mouth daily. 90 tablet 3   aspirin  EC 81 MG tablet Take 1 tablet (81 mg total) by mouth daily. Swallow whole. 30 tablet 2   atorvastatin  (LIPITOR ) 40 MG tablet Take 1 tablet (40 mg total) by mouth daily. 90 tablet 3   budesonide -formoterol  (SYMBICORT ) 160-4.5 MCG/ACT inhaler Inhale 2 puffs into the lungs 3 times/day as needed-between meals & bedtime. 1 each 12   cyclobenzaprine  (FLEXERIL ) 10 MG tablet Take 1 tablet (10 mg total) by mouth 2 (two) times daily as needed for muscle spasms. 30 tablet 0   EPINEPHrine  0.3 mg/0.3  mL IJ SOAJ injection Inject 0.3 mg into the muscle as needed for anaphylaxis. 1 each 3   gabapentin  (NEURONTIN ) 300 MG capsule Take 1 capsule (300 mg total) by mouth 3 (three) times daily. 270 capsule 3   hydrALAZINE  (APRESOLINE ) 50 MG tablet Take 2 tablets (100 mg total) by mouth in the morning and at bedtime. 180 tablet 3   hydrOXYzine  (ATARAX ) 25 MG tablet Take 25 mg by mouth every 6 (six) hours as needed for itching.     losartan -hydrochlorothiazide (HYZAAR) 50-12.5 MG tablet Take 1 tablet by mouth daily. 90 tablet 0   metoprolol  succinate (TOPROL -XL) 50 MG 24 hr tablet Take 1 tablet (50 mg total) by mouth daily. Take with or immediately following a meal. 90 tablet 3   nitroGLYCERIN  (NITROSTAT ) 0.4 MG SL tablet Place 1 tablet (0.4 mg total) under the tongue every 5 (five) minutes as needed for chest pain. 30 tablet 1   pantoprazole  (PROTONIX ) 40 MG tablet Take 1 tablet (40 mg total) by mouth daily. 30 tablet 1   potassium chloride  SA (KLOR-CON  M) 20 MEQ tablet Take 20 mEq by mouth daily.     rivaroxaban  (XARELTO ) 20 MG TABS tablet TAKE ONE TABLET BY MOUTH DAILY WITH SUPPER 90 tablet  3   sertraline  (ZOLOFT ) 100 MG tablet Take 1 tablet (100 mg total) by mouth daily. 90 tablet 3   sucralfate  (CARAFATE ) 1 g tablet Take 1 tablet (1 g total) by mouth 4 (four) times daily -  with meals and at bedtime. 90 tablet 0   tolterodine  (DETROL  LA) 2 MG 24 hr capsule Take 1 capsule (2 mg total) by mouth daily. 90 capsule 3   torsemide (DEMADEX) 20 MG tablet Take 20 mg by mouth 2 (two) times daily.     No current facility-administered medications on file prior to visit.    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   Drug use: No   Sexual activity: Not on file  Other Topics Concern   Not on file  Social History Narrative   Current Social History 02/01/2020        Patient lives alone in a/an apartment which is 1 story. There are not steps up to the entrance the patient uses.       Patient's method of transportation is via family member and the city bus.      The highest level of education was GED school diploma.      The patient currently disabled.      Identified important Relationships are Granddaughter       Pets : None       Interests / Fun: "I like to Crochet"       Current Stressors: "My best friend passed away in 16-Dec-2023"       Social Drivers of Health   Financial Resource Strain: Low Risk  (07/27/2022)   Overall Financial Resource Strain (CARDIA)    Difficulty of Paying Living Expenses: Not hard at all  Food Insecurity: No Food Insecurity (07/27/2022)   Hunger Vital Sign    Worried About Running Out of Food in the Last Year: Never true    Ran Out of Food in the Last Year: Never true  Transportation Needs: No Transportation Needs (07/27/2022)   PRAPARE - Administrator, Civil Service (Medical): No    Lack of Transportation (Non-Medical): No  Physical Activity: Inactive (07/27/2022)   Exercise Vital Sign    Days of Exercise per Week: 0 days    Minutes of Exercise per Session: 0 min  Stress: No Stress Concern Present (07/27/2022)   [employer] of  Occupational Health - Occupational Stress Questionnaire    Feeling of Stress : Only a little  Social Connections: Moderately Integrated (07/27/2022)   Social Connection and Isolation Panel [NHANES]    Frequency of Communication with Friends and Family: More than three times a week    Frequency of Social Gatherings with Friends and Family: Once a week    Attends Religious Services: More than 4 times per year    Active Member of [employer] or Organizations: Yes    Attends Banker Meetings: More than 4 times per year    Marital Status: Widowed  Intimate Partner Violence: Not At Risk (07/27/2022)   Humiliation, Afraid, Rape, and Kick questionnaire    Fear of Current or Ex-Partner: No    Emotionally Abused: No    Physically Abused: No    Sexually Abused: No    Review of Systems: ROS negative except for what is noted on the assessment and plan.  Vitals:   07/01/23 1441 07/01/23 1511  BP: (!) 165/86 136/84  Pulse: 70 68  SpO2: 100%   Weight: 272 lb 3.2 oz (123.5 kg)   Height: 5\' 2"  (1.575 m)     Physical Exam: Constitutional: well-appearing,  in no acute distress HENT: normocephalic atraumatic, mucous membranes moist, poor dentition Eyes: conjunctiva non-erythematous Cardiovascular: regular rate and rhythm, no m/r/g, 2+ pitting edema Pulmonary/Chest: normal work of breathing on room air, lungs clear to auscultation bilaterally Abdominal: soft, non-tender, non-distended MSK: normal bulk and tone Neurological: alert & oriented x 3, no focal deficit Skin: warm and dry Psych: normal mood and behavior  Assessment & Plan:   Patient discussed with Dr. Broadus Canes  SLEEP APNEA, OBSTRUCTIVE Has a history of sleep apnea that was severe. On CPAP but her machine broke. She unfortunately has not had a sleep study done since 2020, and will thus need to follow with [facility]. She does have severe obesity with a BMI close to 50. Will benefit from weight loss. Hgb A1c WNL less than a year ago.  She incorporated changes to her lifestyle. Has stopped drinking 3 cand of regular pepsi daily as well as gatorade and sunkiss. She also has been walking in her home back and form with her walker and doing exercises at home. She has lost about 28 lbs and is very happy about this. However she needs to loose about 100lbs for bilateral knee replacement (has severe OA). She deneis having a history of medullary thyroid cancer. Her brother does have a history of throat cancer, she will confirm what type this is. I do think she would benefit from starting semaglutide. TSH WNL 8 months ago.  -confirm that brother does not have thyroid cancer and she may benefit from starting semaglutide -cw lifestyle modification  -referral to sleep medicine  OA (osteoarthritis) of bilateral knees Has a history of severe OA on the bilateral knees that limit mobility. She started PT and has helped. She has been on chronic norco and using it TID instead of PRN. She states that flexeril  helps her fall asleep. Counseled on using flexeril  10mg  at night time and substitute the norco for this. Use norco in the Am and afternoon. Will attempt to wean her off norco. She will benefit from continued weight loss.   Jose Ngo, MD [facility], PGY-1 Phone: [phone] Date 07/01/2023 Time 7:47 PM

## 2023-07-03 DIAGNOSIS — M170 Bilateral primary osteoarthritis of knee: Secondary | ICD-10-CM | POA: Diagnosis not present

## 2023-07-03 DIAGNOSIS — D631 Anemia in chronic kidney disease: Secondary | ICD-10-CM | POA: Diagnosis not present

## 2023-07-03 DIAGNOSIS — G8929 Other chronic pain: Secondary | ICD-10-CM | POA: Diagnosis not present

## 2023-07-03 DIAGNOSIS — I509 Heart failure, unspecified: Secondary | ICD-10-CM | POA: Diagnosis not present

## 2023-07-03 DIAGNOSIS — N183 Chronic kidney disease, stage 3 unspecified: Secondary | ICD-10-CM | POA: Diagnosis not present

## 2023-07-03 DIAGNOSIS — I25118 Atherosclerotic heart disease of native coronary artery with other forms of angina pectoris: Secondary | ICD-10-CM | POA: Diagnosis not present

## 2023-07-03 DIAGNOSIS — I130 Hypertensive heart and chronic kidney disease with heart failure and stage 1 through stage 4 chronic kidney disease, or unspecified chronic kidney disease: Secondary | ICD-10-CM | POA: Diagnosis not present

## 2023-07-04 ENCOUNTER — Telehealth: Payer: Self-pay | Admitting: *Deleted

## 2023-07-04 DIAGNOSIS — I509 Heart failure, unspecified: Secondary | ICD-10-CM | POA: Diagnosis not present

## 2023-07-04 DIAGNOSIS — M170 Bilateral primary osteoarthritis of knee: Secondary | ICD-10-CM | POA: Diagnosis not present

## 2023-07-04 DIAGNOSIS — I25118 Atherosclerotic heart disease of native coronary artery with other forms of angina pectoris: Secondary | ICD-10-CM | POA: Diagnosis not present

## 2023-07-04 DIAGNOSIS — N183 Chronic kidney disease, stage 3 unspecified: Secondary | ICD-10-CM | POA: Diagnosis not present

## 2023-07-04 DIAGNOSIS — D631 Anemia in chronic kidney disease: Secondary | ICD-10-CM | POA: Diagnosis not present

## 2023-07-04 DIAGNOSIS — I130 Hypertensive heart and chronic kidney disease with heart failure and stage 1 through stage 4 chronic kidney disease, or unspecified chronic kidney disease: Secondary | ICD-10-CM | POA: Diagnosis not present

## 2023-07-04 DIAGNOSIS — G8929 Other chronic pain: Secondary | ICD-10-CM | POA: Diagnosis not present

## 2023-07-04 MED ORDER — HYDRALAZINE HCL 50 MG PO TABS
100.0000 mg | ORAL_TABLET | Freq: Two times a day (BID) | ORAL | 3 refills | Status: DC
Start: 2023-07-04 — End: 2023-08-26

## 2023-07-04 MED ORDER — NITROGLYCERIN 0.4 MG SL SUBL: 0.4000 mg | SUBLINGUAL_TABLET | SUBLINGUAL | 1 refills | Status: AC | PRN

## 2023-07-04 NOTE — Telephone Encounter (Addendum)
 Call to patient and Vanessa Palmer.  Palmer/P today was 121/90.  Previous Blood pressures have been taken in the evening.  Patient has not taken the Hydralazine  in awhile.  Prescription was sent to the [facility] on [address] and the patient uses the [facility] on [address].  Patient does not know when she took her last Hydralazine  tablet.  Did mention that she does get a headache in the evenings sometimes.  Call to [facility] on [address] patient picked up 1 time in August of 2024.  Patient will need new prescription sent to the [facility] on [address] if she is to start to take the Hydralazine  again.  Copied from CRM [id number]. Topic: Clinical - Medication Question >> Jul 04, 2023  8:13 AM Vanessa Palmer wrote: Reason for CRM: Information elevated blood pressure 154/111 she denied any other symptoms. Doesn't have  her hydrALAZINE  (APRESOLINE ) 50 MG tablet she's unsure if she is supposed to be taking the medication  Vanessa Palmer with [facility] [phone]. [name] stated she tried calling the after hours line yesterday and was rerouted. She states she's going see the patient this morning for 9am and if nothing has changed she will call back

## 2023-07-04 NOTE — Telephone Encounter (Signed)
 I have refilled her hydralazine  as it does appear prescribed by dr mullen, I have yet to see her personally so if we could get an appointment in the coming months with me that would be great

## 2023-07-04 NOTE — Addendum Note (Signed)
 Addended by: Priscella Brooms on: 07/04/2023 04:13 PM   Modules accepted: Orders

## 2023-07-05 NOTE — Telephone Encounter (Signed)
 RTC to patient informed her that a prescription has been sent in for her Hydralazine  to the [facility] on [address].  Patient was also informed that she needs to schedule an appointment with Dr. Adriane Albe who is her new PCP soon.  Stated would like to call back later today to schedule the appointment.

## 2023-07-08 DIAGNOSIS — G8929 Other chronic pain: Secondary | ICD-10-CM | POA: Diagnosis not present

## 2023-07-08 DIAGNOSIS — I25118 Atherosclerotic heart disease of native coronary artery with other forms of angina pectoris: Secondary | ICD-10-CM | POA: Diagnosis not present

## 2023-07-08 DIAGNOSIS — D631 Anemia in chronic kidney disease: Secondary | ICD-10-CM | POA: Diagnosis not present

## 2023-07-08 DIAGNOSIS — M170 Bilateral primary osteoarthritis of knee: Secondary | ICD-10-CM | POA: Diagnosis not present

## 2023-07-08 DIAGNOSIS — I130 Hypertensive heart and chronic kidney disease with heart failure and stage 1 through stage 4 chronic kidney disease, or unspecified chronic kidney disease: Secondary | ICD-10-CM | POA: Diagnosis not present

## 2023-07-08 DIAGNOSIS — N183 Chronic kidney disease, stage 3 unspecified: Secondary | ICD-10-CM | POA: Diagnosis not present

## 2023-07-08 DIAGNOSIS — I509 Heart failure, unspecified: Secondary | ICD-10-CM | POA: Diagnosis not present

## 2023-07-08 NOTE — Progress Notes (Signed)
 Please clarify the medical condition(s) necessitating the order for, BNP lab (06/04/23).   Medical record documentation maintained by the performing physician should clearly indicate the medical necessity of the service being performed.    Response: Evaluate for heart failure

## 2023-07-10 DIAGNOSIS — I509 Heart failure, unspecified: Secondary | ICD-10-CM | POA: Diagnosis not present

## 2023-07-10 DIAGNOSIS — G8929 Other chronic pain: Secondary | ICD-10-CM | POA: Diagnosis not present

## 2023-07-10 DIAGNOSIS — I130 Hypertensive heart and chronic kidney disease with heart failure and stage 1 through stage 4 chronic kidney disease, or unspecified chronic kidney disease: Secondary | ICD-10-CM | POA: Diagnosis not present

## 2023-07-10 DIAGNOSIS — D631 Anemia in chronic kidney disease: Secondary | ICD-10-CM | POA: Diagnosis not present

## 2023-07-10 DIAGNOSIS — N183 Chronic kidney disease, stage 3 unspecified: Secondary | ICD-10-CM | POA: Diagnosis not present

## 2023-07-10 DIAGNOSIS — I25118 Atherosclerotic heart disease of native coronary artery with other forms of angina pectoris: Secondary | ICD-10-CM | POA: Diagnosis not present

## 2023-07-10 DIAGNOSIS — M170 Bilateral primary osteoarthritis of knee: Secondary | ICD-10-CM | POA: Diagnosis not present

## 2023-07-11 DIAGNOSIS — I890 Lymphedema, not elsewhere classified: Secondary | ICD-10-CM | POA: Diagnosis not present

## 2023-07-11 DIAGNOSIS — M1990 Unspecified osteoarthritis, unspecified site: Secondary | ICD-10-CM | POA: Diagnosis not present

## 2023-07-11 DIAGNOSIS — I2109 ST elevation (STEMI) myocardial infarction involving other coronary artery of anterior wall: Secondary | ICD-10-CM | POA: Diagnosis not present

## 2023-07-11 DIAGNOSIS — G629 Polyneuropathy, unspecified: Secondary | ICD-10-CM | POA: Diagnosis not present

## 2023-07-11 DIAGNOSIS — R229 Localized swelling, mass and lump, unspecified: Secondary | ICD-10-CM | POA: Diagnosis not present

## 2023-07-11 NOTE — Progress Notes (Signed)
 Internal Medicine Clinic Attending  Case discussed with the resident at the time of the visit.  We reviewed the resident's history and exam and pertinent patient test results.  I agree with the assessment, diagnosis, and plan of care documented in the resident's note.

## 2023-07-15 ENCOUNTER — Telehealth: Payer: Self-pay | Admitting: *Deleted

## 2023-07-15 NOTE — Telephone Encounter (Signed)
 RTC to Chariffe that the Clinics had returned her call. Copied from CRM [id number]. Topic: Clinical - Home Health Verbal Orders >> Jul 10, 2023  3:46 PM Arlie Benedict B wrote: Caller/Agency: Chariffe: [facility] Callback Number: [phone] Service Requested: Physical Therapy Frequency: Verbal start care date for May 22nd  Any new concerns about the patient? No Chariff is calling in to advise the patient is being discharged from [facility] today due to contract changes with her insurance; but will need to have patient orders redone for May the 22nd to restart the PT

## 2023-07-17 ENCOUNTER — Encounter: Admitting: Student

## 2023-07-24 ENCOUNTER — Telehealth: Payer: Self-pay | Admitting: *Deleted

## 2023-07-24 NOTE — Telephone Encounter (Signed)
 Call from [place], PT from [facility].  Will restart PT sessions with patient on tomorrow 1 time a week for 9 weeks.  Will work on Investment banker, operational, Electrical engineer. Verbal Approval given.  Will forward to PCP to affirm or deny.

## 2023-07-25 NOTE — Telephone Encounter (Signed)
 Agree

## 2023-07-29 ENCOUNTER — Telehealth: Payer: Self-pay | Admitting: *Deleted

## 2023-07-29 NOTE — Telephone Encounter (Signed)
 Will forward to PCP. Copied from CRM [id number]. Topic: Clinical - Medical Advice >> Jul 26, 2023  2:39 PM [name] wrote: Reason for CRM: Charifee called in from [facility] regarding patient. Stated that there was a drug interaction with potassium chloride  and hydroxydione, and potassium chloride  and tolperobine. Please contact to advise if patient needs to continue taking this medication or not. Chariffee can be reached at [phone].

## 2023-07-30 ENCOUNTER — Telehealth: Payer: Self-pay

## 2023-07-30 NOTE — Telephone Encounter (Signed)
 Call to patient asked her to take the Potassium in the morning and to take her Tolterodine  in the evening and to hold the Hydroxizine for now.  Patient to go home and review her meds and to return call if a problem.  Patient was also asked to bring all of her meds in on her visit with the doctor on 6/19.  States will do.

## 2023-07-30 NOTE — Telephone Encounter (Signed)
 Please advise the patient to take potassium in the morning and tolterodine  in the evening. Hold hydroxyzine  for now. Please ask patient to bring all medications to her appointment with PCP 08/15/23. Evaluate whether hydroxyzine  is still needed.

## 2023-07-31 ENCOUNTER — Other Ambulatory Visit: Payer: Self-pay | Admitting: Internal Medicine

## 2023-07-31 DIAGNOSIS — M2550 Pain in unspecified joint: Secondary | ICD-10-CM

## 2023-07-31 NOTE — Telephone Encounter (Signed)
 Last rx written - 07/01/23. Last OV - 07/01/23. Next OV - 08/15/23. TOX - 09/04/22.

## 2023-07-31 NOTE — Telephone Encounter (Signed)
 Copied from CRM [id number]. Topic: Clinical - Medication Refill >> Jul 31, 2023 11:33 AM [name] wrote: Medication: oxyCODONE -acetaminophen  (PERCOCET) 10-325 MG tablet  Has the patient contacted their pharmacy? Yes (Agent: If no, request that the patient contact the pharmacy for the refill. If patient does not wish to contact the pharmacy document the reason why and proceed with request.) (Agent: If yes, when and what did the pharmacy advise?)  This is the patient's preferred pharmacy:  [id number] - [place], [place] - [address] AT [facility] [address] [place] [address] Phone: [phone] Fax: [phone]   Is this the correct pharmacy for this prescription? Yes If no, delete pharmacy and type the correct one.   Has the prescription been filled recently? No  Is the patient out of the medication? No  Has the patient been seen for an appointment in the last year OR does the patient have an upcoming appointment? Yes  Can we respond through MyChart? Yes  Agent: Please be advised that Rx refills may take up to 3 business days. We ask that you follow-up with your pharmacy.

## 2023-08-01 MED ORDER — OXYCODONE-ACETAMINOPHEN 10-325 MG PO TABS: ORAL_TABLET | ORAL | 0 refills | Status: DC

## 2023-08-01 NOTE — Telephone Encounter (Addendum)
 Received a fax from the pharmacy with a message to the prescriber. Per pharmacy the plan requires specific directions to process the prescription. The pharmacy is requesting a new prescription with specific instructions including the frequency of how the patient will be using the medication and day supply limitations.

## 2023-08-02 ENCOUNTER — Telehealth: Payer: Self-pay | Admitting: *Deleted

## 2023-08-02 ENCOUNTER — Other Ambulatory Visit: Payer: Self-pay | Admitting: Internal Medicine

## 2023-08-02 DIAGNOSIS — M2550 Pain in unspecified joint: Secondary | ICD-10-CM

## 2023-08-02 MED ORDER — OXYCODONE-ACETAMINOPHEN 10-325 MG PO TABS: ORAL_TABLET | ORAL | 0 refills | Status: DC

## 2023-08-02 NOTE — Telephone Encounter (Signed)
 Pt called / informed Oxycodone  rx has been sent to the pharmacy this am and to call [facility].  Stated she will.

## 2023-08-02 NOTE — Telephone Encounter (Signed)
 Copied from CRM [id number]. Topic: Clinical - Medication Refill >> Jul 31, 2023 11:33 AM [name] wrote: Medication: oxyCODONE -acetaminophen  (PERCOCET) 10-325 MG tablet  Has the patient contacted their pharmacy? Yes (Agent: If no, request that the patient contact the pharmacy for the refill. If patient does not wish to contact the pharmacy document the reason why and proceed with request.) (Agent: If yes, when and what did the pharmacy advise?)  This is the patient's preferred pharmacy:  [id number] - [place], [place] - [address] AT [facility] [address] [place] [address] Phone: [phone] Fax: [phone]   Is this the correct pharmacy for this prescription? Yes If no, delete pharmacy and type the correct one.   Has the prescription been filled recently? No  Is the patient out of the medication? No  Has the patient been seen for an appointment in the last year OR does the patient have an upcoming appointment? Yes  Can we respond through MyChart? Yes  Agent: Please be advised that Rx refills may take up to 3 business days. We ask that you follow-up with your pharmacy. >> Aug 02, 2023  9:12 AM [name] wrote: Medication: oxyCODONE -acetaminophen  (PERCOCET) 10-325 MG tablet  Patient needs call back on update or medication will bout of medication by 06/07 [phone] >> Aug 02, 2023  8:17 AM [name] wrote: [name] from [facility] needs call back on clarification on how to take medication.

## 2023-08-02 NOTE — Telephone Encounter (Signed)
 Copied from CRM [id number]. Topic: Clinical - Medication Refill >> Jul 31, 2023 11:33 AM [name] wrote: Medication: oxyCODONE -acetaminophen  (PERCOCET) 10-325 MG tablet  Has the patient contacted their pharmacy? Yes (Agent: If no, request that the patient contact the pharmacy for the refill. If patient does not wish to contact the pharmacy document the reason why and proceed with request.) (Agent: If yes, when and what did the pharmacy advise?)  This is the patient's preferred pharmacy:  [id number] - [place], [place] - [address] AT [facility] [address] [place] [address] Phone: [phone] Fax: [phone]   Is this the correct pharmacy for this prescription? Yes If no, delete pharmacy and type the correct one.   Has the prescription been filled recently? No  Is the patient out of the medication? No  Has the patient been seen for an appointment in the last year OR does the patient have an upcoming appointment? Yes  Can we respond through MyChart? Yes  Agent: Please be advised that Rx refills may take up to 3 business days. We ask that you follow-up with your pharmacy. >> Aug 02, 2023  8:17 AM [name] wrote: [name] from [facility] needs call back on clarification on how to take medication.

## 2023-08-02 NOTE — Telephone Encounter (Signed)
 Medication sent to pharmacy, confirmed received by pharmacy.      Copied from CRM [id number]. Topic: Clinical - Prescription Issue >> Aug 02, 2023 11:25 AM [name] F wrote: Reason for CRM:   Medication In Question: oxyCODONE -acetaminophen  (PERCOCET) 10-325 MG tablet  Patient is calling in to inform the office that her prescription (listed above) is not going to be available at her [facility] pharmacy until next week. Patient is in need of the prescription and is requesting the office resubmit her prescription to the [facility] listed below.   Patient would like  callback with an update before end of day today 08/02/2023.   Phone: [phone]   Store [phone]  [facility] at    [address], [place] [address]  Phone: [phone]

## 2023-08-02 NOTE — Telephone Encounter (Signed)
 Oxycodone  was refilled today and pt was called.

## 2023-08-02 NOTE — Telephone Encounter (Signed)
 Return call to [place] at [place] - stated they need  Oxycodone  clarification . They need to know how many tabs pt should take by mouth in the morning and afternoon. Send need rx. Thanks

## 2023-08-06 ENCOUNTER — Telehealth: Payer: Self-pay | Admitting: *Deleted

## 2023-08-06 MED ORDER — OXYCODONE-ACETAMINOPHEN 10-325 MG PO TABS: 1.0000 | ORAL_TABLET | Freq: Two times a day (BID) | ORAL | 0 refills | Status: DC | PRN

## 2023-08-06 NOTE — Telephone Encounter (Signed)
 Ok sounds good that pain improved with her meds and that he will recheck BP tomorrow.

## 2023-08-06 NOTE — Telephone Encounter (Signed)
 Call to [facility] PTA with [facility].  States patient was in a lot of pain on yesterday .  Call to patient was a level 10 in her legs.  Was able to get her Pain medication on yesterday.  Pain is now down to a level 4.  Patient has not taken her B/P today . Was 172/92 of which Consuelo Denmark felt was due to her pain.  Patient has not repeated B/P today. Will repeat and call on tomorrow.  States is doing better.   Copied from CRM [id number]. Topic: General - Other >> Aug 06, 2023  4:04 PM Blair Bumpers wrote: Reason for CRM: Haskell Linker,  with [facility] called stating that he saw the patient on yesterday, 08/05/23 for therapy yesterday. He states he could not treat her because her blood pressure was up really high, which he states it may be due to patient was in a lot of pain. Consuelo Denmark also stated that patient was able to get medication in hand yesterday while he was there but he wasn't able to call due to it being after hours. For further questions or concerns, please give him a call at [phone].

## 2023-08-06 NOTE — Telephone Encounter (Signed)
 Will forward to PCP for parameters. Copied from CRM [id number]. Topic: Clinical - Home Health Verbal Orders >> Aug 06, 2023  4:12 PM Tiffany H wrote: Caller/Agency: [facility] Callback Number: [phone] Service Requested: Physical Therapy - RN Eval Frequency: Same as before, adding an RN evaluation.  Any new concerns about the patient? Yes. Requested RN Eval - open wound on leg.  Due to instability related to Congestive Heart Failure Calf measurement: 52.5.inches left leg, 57 right leg Please set expectations about constitutes medical emergency with this patient. Normal vitals are difficult to secure due to patient condition and instability. How many more inches would constitute a medical emergency. Please advise.

## 2023-08-09 ENCOUNTER — Telehealth: Payer: Self-pay | Admitting: Orthopaedic Surgery

## 2023-08-09 ENCOUNTER — Other Ambulatory Visit: Payer: Self-pay | Admitting: Orthopaedic Surgery

## 2023-08-09 MED ORDER — CYCLOBENZAPRINE HCL 10 MG PO TABS: 10.0000 mg | ORAL_TABLET | Freq: Two times a day (BID) | ORAL | 0 refills | Status: AC | PRN

## 2023-08-09 NOTE — Telephone Encounter (Signed)
 Agree with RN evaluation of wound.  I do not think I have an leg circumference size that would constitute a medical emergency.  For vitals I would say that HR >130, BP>180/110, SPO2 <88%.

## 2023-08-09 NOTE — Telephone Encounter (Signed)
 RTC to [place], PT with [facility]. message left with the parameters of HR>130, B/P >180/110 and SPO2<88% For vitals that need immediate  follow up.

## 2023-08-09 NOTE — Telephone Encounter (Signed)
 Patient called and needed a refill on Muscle Relaxers. CB#[phone]

## 2023-08-12 ENCOUNTER — Telehealth: Payer: Self-pay | Admitting: *Deleted

## 2023-08-12 ENCOUNTER — Ambulatory Visit: Payer: Self-pay

## 2023-08-12 NOTE — Telephone Encounter (Signed)
 Ok that will be fine, thank you

## 2023-08-12 NOTE — Progress Notes (Signed)
 Complex Care Management Care Guide Note  08/12/2023 Name: Vanessa Palmer MRN: [id number] DOB: 1950/05/04  Vanessa Palmer is a 73 y.o. year old female who is a primary care patient of Priscella Brooms, DO and is actively engaged with the care management team. I reached out to Vanessa Palmer by phone today to assist with re-scheduling  with the RN Case Manager.  Follow up plan: Unsuccessful telephone outreach attempt made. A HIPAA compliant phone message was left for the patient providing contact information and requesting a return call.  Barnie Bora  [facility]  [facility], [facility] Guide  Direct Dial: [phone]  Fax [phone]

## 2023-08-12 NOTE — Telephone Encounter (Signed)
 RTC to [facility] just wanted to notify doctor of measurements done.  Per their protocol will notify doctor if there is an increase or decrease of 3 cm.    Copied from CRM [id number]. Topic: Clinical - Home Health Verbal Orders >> Aug 09, 2023  4:47 PM Tiffany H wrote: Charisse with [facility] called to request parameters for calf muscle circumference. Call back if there's an increase of more than 3 centimeters or less than three centimeters.   52.5 right leg 57.0 left leg  Anything above or below three additional centimeters, the doctor will be contacted. Please provide verbals for this.

## 2023-08-12 NOTE — Telephone Encounter (Signed)
 FYI Only or Action Required?: Action required by provider  Patient was last seen in primary care on 07/01/2023 by Jose Ngo, MD. Called Nurse Triage reporting Hypertension and Leg Swelling. Symptoms began several days ago. Interventions attempted: Prescription medications: amlodipine , hydralazine , metoprolol , hyzaar, torsemide. Symptoms are: gradually worsening.  Triage Disposition: See PCP within 24 hours  Patient/caregiver understands and will follow disposition?: No, wishes to speak with PCP    Copied from CRM [id number]. Topic: Clinical - Red Word Triage >> Aug 12, 2023  4:17 PM Brittney F wrote: Red Word that prompted transfer to Nurse Triage:  Vanessa Palmer calling from [facility] to report elevated blood pressure  159/110 Reason for Disposition  [1] MODERATE leg swelling (e.g., swelling extends up to knees) AND [2] new-onset or worsening  [1] Systolic BP  >= 130 OR Diastolic >= 80 AND [2] taking BP medications  Answer Assessment - Initial Assessment Questions 1. BLOOD PRESSURE: What is the blood pressure? Did you take at least two measurements 5 minutes apart?     159/110 (RA); 154/110 (LA)  2. ONSET: When did you take your blood pressure?     Just a few moments before call  3. HOW: How did you take your blood pressure? (e.g., automatic home BP monitor, visiting nurse)     Visiting nurse  4. HISTORY: Do you have a history of high blood pressure?     Yes  5. MEDICINES: Are you taking any medicines for blood pressure? Have you missed any doses recently?     Yes, take BP meds. Has not  missed any doses  6. OTHER SYMPTOMS: Do you have any symptoms? (e.g., blurred vision, chest pain, difficulty breathing, headache, weakness)     Right leg swelling and weeping x 1 week;   7. PREGNANCY: Is there any chance you are pregnant? When was your last menstrual period?     No  Answer Assessment - Initial Assessment Questions 1. ONSET: When did the  swelling start? (e.g., minutes, hours, days)     Has been getting worse over the last week. Has been having edema the past month  2. LOCATION: What part of the leg is swollen?  Are both legs swollen or just one leg?     Right leg, from the knee down and back of calf, shan  3. SEVERITY: How bad is the swelling? (e.g., localized; mild, moderate, severe)   - Localized: Small area of swelling localized to one leg.   - MILD pedal edema: Swelling limited to foot and ankle, pitting edema < 1/4 inch (6 mm) deep, rest and elevation eliminate most or all swelling.   - MODERATE edema: Swelling of lower leg to knee, pitting edema > 1/4 inch (6 mm) deep, rest and elevation only partially reduce swelling.   - SEVERE edema: Swelling extends above knee, facial or hand swelling present.      Moderate, swelling goes down when its elevated  4. REDNESS: Does the swelling look red or infected?     Does not look infected, per visiting nurse  5. PAIN: Is the swelling painful to touch? If Yes, ask: How painful is it?   (Scale 1-10; mild, moderate or severe)     Mild pain, tender to touch  6. FEVER: Do you have a fever? If Yes, ask: What is it, how was it measured, and when did it start?      No 7. CAUSE: What do you think is causing the leg swelling?  Unsure of specific  8. MEDICAL HISTORY: Do you have a history of blood clots (e.g., DVT), cancer, heart failure, kidney disease, or liver failure?     Yes, kidney disease, dvt, and heart failure  9. RECURRENT SYMPTOM: Have you had leg swelling before? If Yes, ask: When was the last time? What happened that time?     Yes, has had swelling ongoing  10. OTHER SYMPTOMS: Do you have any other symptoms? (e.g., chest pain, difficulty breathing)       No  11. PREGNANCY: Is there any chance you are pregnant? When was your last menstrual period?       No    Leg swelling x over a month, getting worse this past week. Now with  swelling extending to knee and weeping fluid. Per patient, swelling is better in the evning but worse during the day. Pt admits to not taking Diuretics as prescribed because they make her use the bathroom more. Patient re-educated on the importance of not quitting medications abruptly.  No avail appts within 24 hours. Pt refusing to go to the ED , says she will take her torsemide and monitor symptoms. Called [facility] for assistance, no answer, will route message HP for follow-up.  Protocols used: Blood Pressure - High-A-AH, Leg Swelling and Edema-A-AH

## 2023-08-13 NOTE — Telephone Encounter (Signed)
 RTC to [place] PT from [facility].  Given ok to follow the parameters that he presented for patient to notify doctor of.  Message was left on Charisse voice mail to follow for patient.

## 2023-08-13 NOTE — Telephone Encounter (Signed)
 OK thank you

## 2023-08-14 ENCOUNTER — Telehealth: Payer: Self-pay | Admitting: *Deleted

## 2023-08-14 NOTE — Telephone Encounter (Signed)
 Call from [place], RN from [facility]. Would like to have orders for Skilled Nursing to do Wound Care for Venus Ulcers on patient's legs.  Cluster of blisters.   2 times a week for 4 weeks to assist patient with care of.  Verbal approval was given.  Patient has an appointment on 08/15/2023. Will apply temporary dressing so that doctor can see on tomorrow's visit. Copied from CRM [id number]. Topic: Clinical - Medical Advice >> Aug 14, 2023  3:41 PM [name] wrote: Reason for CRM: Sherleen Dirks from [facility] called requesting call back. Please contact at [phone]. Patient is scheduled for appointment on 08/15/23.

## 2023-08-15 ENCOUNTER — Ambulatory Visit: Payer: Self-pay | Admitting: Internal Medicine

## 2023-08-15 VITALS — BP 97/65 | HR 74 | Temp 98.2°F | Ht 62.0 in | Wt 278.4 lb

## 2023-08-15 DIAGNOSIS — I10 Essential (primary) hypertension: Secondary | ICD-10-CM

## 2023-08-15 DIAGNOSIS — I129 Hypertensive chronic kidney disease with stage 1 through stage 4 chronic kidney disease, or unspecified chronic kidney disease: Secondary | ICD-10-CM | POA: Diagnosis not present

## 2023-08-15 DIAGNOSIS — H9193 Unspecified hearing loss, bilateral: Secondary | ICD-10-CM | POA: Diagnosis not present

## 2023-08-15 DIAGNOSIS — G4733 Obstructive sleep apnea (adult) (pediatric): Secondary | ICD-10-CM

## 2023-08-15 DIAGNOSIS — I890 Lymphedema, not elsewhere classified: Secondary | ICD-10-CM | POA: Diagnosis not present

## 2023-08-15 DIAGNOSIS — N183 Chronic kidney disease, stage 3 unspecified: Secondary | ICD-10-CM | POA: Diagnosis not present

## 2023-08-15 DIAGNOSIS — N1831 Chronic kidney disease, stage 3a: Secondary | ICD-10-CM

## 2023-08-15 DIAGNOSIS — Z6841 Body Mass Index (BMI) 40.0 and over, adult: Secondary | ICD-10-CM

## 2023-08-15 MED ORDER — PANTOPRAZOLE SODIUM 40 MG PO TBEC: 40.0000 mg | DELAYED_RELEASE_TABLET | Freq: Every day | ORAL | 1 refills | Status: DC

## 2023-08-15 MED ORDER — TELMISARTAN 20 MG PO TABS: 20.0000 mg | ORAL_TABLET | Freq: Every day | ORAL | 3 refills | Status: DC

## 2023-08-15 MED ORDER — TORSEMIDE 20 MG PO TABS
20.0000 mg | ORAL_TABLET | Freq: Every day | ORAL | 3 refills | Status: AC
Start: 2023-08-15 — End: ?

## 2023-08-15 MED ORDER — ZEPBOUND 2.5 MG/0.5ML ~~LOC~~ SOAJ: 2.5000 mg | SUBCUTANEOUS | 0 refills | Status: AC

## 2023-08-15 MED ORDER — ZEPBOUND 5 MG/0.5ML ~~LOC~~ SOAJ: 5.0000 mg | SUBCUTANEOUS | 1 refills | Status: DC

## 2023-08-15 NOTE — Patient Instructions (Signed)
 I have made changes to your blood pressure medications.  We will continue compression with home health.  I need you to start back taking torsemide once a day.  Stop taking Amlodipine  and Losartan -hydrochlorothiazide, you will now take Telmisartan 20mg  once a day instead.

## 2023-08-16 DIAGNOSIS — H9193 Unspecified hearing loss, bilateral: Secondary | ICD-10-CM | POA: Insufficient documentation

## 2023-08-16 LAB — BMP8+ANION GAP
Anion Gap: 18 mmol/L (ref 10.0–18.0)
BUN/Creatinine Ratio: 11 — ABNORMAL LOW (ref 12–28)
BUN: 25 mg/dL (ref 8–27)
CO2: 19 mmol/L — ABNORMAL LOW (ref 20–29)
Calcium: 8.4 mg/dL — ABNORMAL LOW (ref 8.7–10.3)
Chloride: 102 mmol/L (ref 96–106)
Creatinine, Ser: 2.34 mg/dL — ABNORMAL HIGH (ref 0.57–1.00)
Glucose: 99 mg/dL (ref 70–99)
Potassium: 4.9 mmol/L (ref 3.5–5.2)
Sodium: 139 mmol/L (ref 134–144)
eGFR: 22 mL/min/{1.73_m2} — ABNORMAL LOW (ref >59)

## 2023-08-16 NOTE — Assessment & Plan Note (Signed)
 Recheck BMP.

## 2023-08-16 NOTE — Assessment & Plan Note (Signed)
 She has a history of obstructive sleep apnea has not been able to tolerate CPAP therapy.  She is interested in weight loss and has heard about Zepbound.  She has confirmed that she has no family history of thyroid cancer and no personal history of pancreatitis.  Start Zepbound 2.5 mg weekly increase to 5 mg weekly after 4 weeks.

## 2023-08-16 NOTE — Assessment & Plan Note (Signed)
 She reports subjective hearing loss.  Ear canals and tympanic membranes appear normal on exam will send her to audiology for formal testing.

## 2023-08-16 NOTE — Assessment & Plan Note (Signed)
 I stressed the importance of compression for her wound and lymphedema this is a chronic disease that requires compression.  Appreciate home health nurse coming to wrap.  Discussed with her doing our part with some diuretic use.  I really need her to take torsemide at least once daily to help control her weeping edema.

## 2023-08-16 NOTE — Progress Notes (Signed)
 Established Patient Office Visit  Subjective   Patient ID: Vanessa Palmer, female    DOB: [birth year]  Age: 73 y.o. MRN: [id number]  Chief Complaint  Patient presents with   Leg Swelling    Bilateral lower ext edema  Right leg draining has wound care appt in Aug-pt requesting something sooner    Vanessa Palmer has been a patient of our clinic for some time she was previously cared for by Dr. Winford Haus and I have become her new primary care physician.  This is our first visit her biggest complaint is her leg swelling from chronic lymphedema and a wound on the right lower extremity.  She has been having home health come out and dressed the wound and wrapped the leg.  She previously has been seen at the [facility] wound care center for recurrent lower extremity wounds secondary to her lymphedema.  She is already called and gotten an appointment but this is not until August to be seen at the wound care center.  At her last evaluation there she was recommended to use juxta lites after the wound healed, she has not been using these and patient and daughter are not sure that they would be able to find them. I also spoke to her home health nurse Cecelia, who will continue to wrap the legs however she has limited home health nursing visits remaining per her insurance. She brings her medications with her and I have done a medication reconciliation she notably is missing losartan -HCTZ but does have good refill history on this medication.  She does have torsemide with her however this bottle is packed fully to the top with unused medication.  Upon further questioning she admits she does not take torsemide because it causes her to urinate.  She does report good adherence to her other medications.      Objective:     BP 97/65 (BP Location: Right Arm, Patient Position: Sitting, Cuff Size: Normal) Comment: Map 76  Pulse 74   Temp 98.2 F (36.8 C) (Oral)   Ht 5' 2 (1.575 m)   Wt 278 lb 6.4 oz (126.3 kg)    SpO2 95%   BMI 50.92 kg/m  BP Readings from Last 3 Encounters:  08/15/23 97/65  07/01/23 136/84  06/11/23 123/64   Wt Readings from Last 3 Encounters:  08/15/23 278 lb 6.4 oz (126.3 kg)  07/01/23 272 lb 3.2 oz (123.5 kg)  06/11/23 279 lb 6.4 oz (126.7 kg)      Physical Exam Vitals and nursing note reviewed.  Constitutional:      Appearance: Normal appearance.     Comments: In transport wheel chair  HENT:     Right Ear: Tympanic membrane and ear canal normal.     Left Ear: Tympanic membrane and ear canal normal.   Cardiovascular:     Rate and Rhythm: Normal rate and regular rhythm.  Pulmonary:     Effort: Pulmonary effort is normal.     Breath sounds: Normal breath sounds.   Musculoskeletal:     Right lower leg: Edema present.     Left lower leg: Edema present.   Skin:    Comments: Small wound to right posterior lower extremity.  Bilateral legs have brawny edema   Neurological:     Mental Status: She is alert.   Psychiatric:        Mood and Affect: Mood normal.        Behavior: Behavior normal.      Results for  orders placed or performed in visit on 08/15/23  BMP8+Anion Gap  Result Value Ref Range   Glucose 99 70 - 99 mg/dL   BUN 25 8 - 27 mg/dL   Creatinine, Ser 6.21 (H) 0.57 - 1.00 mg/dL   eGFR 22 (L) >30 QM/VHQ/4.69   BUN/Creatinine Ratio 11 (L) 12 - 28   Sodium 139 134 - 144 mmol/L   Potassium 4.9 3.5 - 5.2 mmol/L   Chloride 102 96 - 106 mmol/L   CO2 19 (L) 20 - 29 mmol/L   Anion Gap 18.0 10.0 - 18.0 mmol/L   Calcium  8.4 (L) 8.7 - 10.3 mg/dL    Last CBC Lab Results  Component Value Date   WBC 6.4 06/04/2023   HGB 12.8 06/04/2023   HCT 38.2 06/04/2023   MCV 91.2 06/04/2023   MCH 30.5 06/04/2023   RDW 13.6 06/04/2023   PLT 243 06/04/2023   Last metabolic panel Lab Results  Component Value Date   GLUCOSE 99 08/15/2023   NA 139 08/15/2023   K 4.9 08/15/2023   CL 102 08/15/2023   CO2 19 (L) 08/15/2023   BUN 25 08/15/2023   CREATININE  2.34 (H) 08/15/2023   EGFR 22 (L) 08/15/2023   CALCIUM  8.4 (L) 08/15/2023   PHOS 3.3 04/05/2012   PROT 8.8 (H) 06/04/2023   ALBUMIN  4.2 06/04/2023   LABGLOB 3.8 10/05/2022   AGRATIO 1.0 (L) 07/21/2021   BILITOT 1.3 (H) 06/04/2023   ALKPHOS 118 06/04/2023   AST 23 06/04/2023   ALT 12 06/04/2023   ANIONGAP 13 06/04/2023   Last hemoglobin A1c Lab Results  Component Value Date   HGBA1C 5.6 07/27/2022      The ASCVD Risk score (Arnett DK, et al., 2019) failed to calculate for the following reasons:   Risk score cannot be calculated because patient has a medical history suggesting prior/existing ASCVD    Assessment & Plan:   Problem List Items Addressed This Visit       Cardiovascular and Mediastinum   Essential hypertension - Primary (Chronic)   Blood pressure was noted to be elevated at home health but is actually low normal here suspect that 1 was due to pain.  I think this could be a good opportunity to change around her antihypertensives. Will discontinue amlodipine , while not the cause of her lower extremity edema it could contribute. Discontinue losartan  HCTZ would prefer the longer acting telmisartan and would prefer to get rid of HCTZ especially in the setting of loop diuretic use. As she has not been taking her diuretic almost at all I will have her go to 20 mg once daily I stressed the importance of this in relation to her lower extremity edema which is causing her lower extremity wound.      Relevant Medications   telmisartan (MICARDIS) 20 MG tablet   torsemide (DEMADEX) 20 MG tablet   Other Relevant Orders   BMP8+Anion Gap (Completed)     Respiratory   SLEEP APNEA, OBSTRUCTIVE   She has a history of obstructive sleep apnea has not been able to tolerate CPAP therapy.  She is interested in weight loss and has heard about Zepbound.  She has confirmed that she has no family history of thyroid cancer and no personal history of pancreatitis.  Start Zepbound 2.5 mg  weekly increase to 5 mg weekly after 4 weeks.        Nervous and Auditory   Bilateral hearing loss   She reports subjective hearing loss.  Ear canals and tympanic membranes appear normal on exam will send her to audiology for formal testing.      Relevant Orders   Ambulatory referral to Audiology     Genitourinary   Chronic kidney disease (CKD), stage III (moderate) (HCC) (Chronic)   Recheck BMP        Other   Morbid obesity (HCC)   Her BMI is 50.92 I certainly hope with the Zepbound prescribed for obstructive sleep apnea will help her with weight loss.      Relevant Medications   tirzepatide (ZEPBOUND) 2.5 MG/0.5ML Pen   tirzepatide (ZEPBOUND) 5 MG/0.5ML Pen (Start on 09/12/2023)   Lymphedema   I stressed the importance of compression for her wound and lymphedema this is a chronic disease that requires compression.  Appreciate home health nurse coming to wrap.  Discussed with her doing our part with some diuretic use.  I really need her to take torsemide at least once daily to help control her weeping edema.      Relevant Medications   torsemide (DEMADEX) 20 MG tablet    Return in about 6 weeks (around 09/26/2023) for HTN.    Priscella Brooms, DO

## 2023-08-16 NOTE — Assessment & Plan Note (Signed)
 Her BMI is 50.92 I certainly hope with the Zepbound prescribed for obstructive sleep apnea will help her with weight loss.

## 2023-08-16 NOTE — Assessment & Plan Note (Signed)
 Blood pressure was noted to be elevated at home health but is actually low normal here suspect that 1 was due to pain.  I think this could be a good opportunity to change around her antihypertensives. Will discontinue amlodipine , while not the cause of her lower extremity edema it could contribute. Discontinue losartan  HCTZ would prefer the longer acting telmisartan and would prefer to get rid of HCTZ especially in the setting of loop diuretic use. As she has not been taking her diuretic almost at all I will have her go to 20 mg once daily I stressed the importance of this in relation to her lower extremity edema which is causing her lower extremity wound.

## 2023-08-16 NOTE — Telephone Encounter (Signed)
 I spoke with her yesterday after the visit with Farheen

## 2023-08-19 NOTE — Progress Notes (Signed)
 Complex Care Management Care Guide Note  08/19/2023 Name: OYUKI HOGAN MRN: [id number] DOB: 1951/01/02  Marnie JONETTA Duecker is a 72 y.o. year old female who is a primary care patient of [name], DO and is actively engaged with the care management team. I reached out to Breaunna D Spies by phone today to assist with re-scheduling  with the RN Case Manager.  Follow up plan: Telephone appointment with complex care management team member scheduled for:  09/18/23  Harlene Satterfield  [facility]  [facility], [facility] Guide  Direct Dial: [phone]  Fax [phone]

## 2023-08-21 ENCOUNTER — Telehealth: Payer: Self-pay | Admitting: *Deleted

## 2023-08-21 NOTE — Telephone Encounter (Signed)
 RTC to [place], PT -message left having the nurse come out to work on the patient's leg wounds.  Patient was called and states that she did not hurt herself when she fell.  Patient said that the knot appeared on the opposite side of her fall.  Is tender to touch.  Has been scheduled for a follow up appointment in the Clinics o Monday 6/30 with Dr. Rosan. Copied from CRM [id number]. Topic: Clinical - Home Health Verbal Orders >> Aug 21, 2023  2:41 PM [name] wrote: Caller/Agency: [facility] (physical therapist )leopoldo lynden [place] Number: [phone]  Service Requested: Physical Therapy . Request to put patient on hold , her insurance is only giving  12 visit in a short period , going to give the visit to the nurse to focus on her right leg wound , please call back Charisse  Frequency: NA Any new concerns about the patient? Yes had a fall 08/17/23 she was sitting on her low couch and was not able to get up, patient did not injury herself and landing on her buttocks, last week complaining on her stomach on her left side there is a knot , almost like a foreign material like its not suppose to be there  when touch it feel like there something sharp in her stomach , when patient touch painful when touch its tender .when touch it it feels sharp

## 2023-08-21 NOTE — Telephone Encounter (Signed)
 Agree with holding HHPT for now and focus on wound care by [facility]  Good to hear we have an appointment on Monday, if pain were to worsen before then she should be advised to go to ED for evaluation.

## 2023-08-23 ENCOUNTER — Ambulatory Visit: Payer: Self-pay | Admitting: Internal Medicine

## 2023-08-23 ENCOUNTER — Other Ambulatory Visit: Payer: Self-pay | Admitting: Internal Medicine

## 2023-08-23 DIAGNOSIS — I10 Essential (primary) hypertension: Secondary | ICD-10-CM

## 2023-08-23 NOTE — Telephone Encounter (Signed)
 Medication discontinued 08/15/23

## 2023-08-26 ENCOUNTER — Encounter: Payer: Self-pay | Admitting: Internal Medicine

## 2023-08-26 ENCOUNTER — Ambulatory Visit (INDEPENDENT_AMBULATORY_CARE_PROVIDER_SITE_OTHER): Admitting: Internal Medicine

## 2023-08-26 VITALS — BP 91/63 | HR 57 | Temp 97.8°F | Ht 62.0 in | Wt 272.6 lb

## 2023-08-26 DIAGNOSIS — I890 Lymphedema, not elsewhere classified: Secondary | ICD-10-CM

## 2023-08-26 DIAGNOSIS — N1831 Chronic kidney disease, stage 3a: Secondary | ICD-10-CM | POA: Diagnosis not present

## 2023-08-26 DIAGNOSIS — N3946 Mixed incontinence: Secondary | ICD-10-CM

## 2023-08-26 DIAGNOSIS — I10 Essential (primary) hypertension: Secondary | ICD-10-CM | POA: Diagnosis not present

## 2023-08-26 DIAGNOSIS — N183 Chronic kidney disease, stage 3 unspecified: Secondary | ICD-10-CM | POA: Diagnosis not present

## 2023-08-26 DIAGNOSIS — I129 Hypertensive chronic kidney disease with stage 1 through stage 4 chronic kidney disease, or unspecified chronic kidney disease: Secondary | ICD-10-CM | POA: Diagnosis not present

## 2023-08-26 DIAGNOSIS — R32 Unspecified urinary incontinence: Secondary | ICD-10-CM

## 2023-08-26 NOTE — Assessment & Plan Note (Signed)
 Recheck BMP today, last BMP with slgiht elevated to SCr/ possible AKI.

## 2023-08-26 NOTE — Assessment & Plan Note (Signed)
 Given lower BP, will go ahead and hold her Hydralazine  (I am unclear is she has been taking this twice a day or not) went over medications again.  Asked to bring all meds to next appointment.

## 2023-08-26 NOTE — Patient Instructions (Addendum)
 Please bring all of your medications with you.  I will have you stop taking the hydralazine  for now.

## 2023-08-26 NOTE — Assessment & Plan Note (Signed)
 Reinforced need to compression, good to see right leg but she needs to find her juxtalites and use on other leg as well.

## 2023-08-26 NOTE — Progress Notes (Addendum)
 Established Patient Office Visit  Subjective   Patient ID: Vanessa Palmer, female    DOB: 1950-12-24  Age: 73 y.o. MRN: [id number]  Chief Complaint  Patient presents with   Fall   Knot on stomach   Vanessa Palmer, is brought in by her daughter.  They note that they have not been able to get the Mounjaro yet as they are waiting on a prior Auth.  They may not have started the telmisartan  and are wondering about a prior Auth for that 1 as well but generally somewhat unclear about what medications they are taking. Bula has reported that she is taking torsemide  once daily now.  Legs feel somewhat better.      Objective:     BP 91/63 (BP Location: Right Arm, Patient Position: Sitting, Cuff Size: Large)   Pulse (!) 57   Temp 97.8 F (36.6 C) (Oral)   Ht 5' 2 (1.575 m)   Wt 272 lb 9.6 oz (123.7 kg)   SpO2 99% Comment: RA  BMI 49.86 kg/m  BP Readings from Last 3 Encounters:  08/26/23 91/63  08/15/23 97/65  07/01/23 136/84   Wt Readings from Last 3 Encounters:  08/26/23 272 lb 9.6 oz (123.7 kg)  08/15/23 278 lb 6.4 oz (126.3 kg)  07/01/23 272 lb 3.2 oz (123.5 kg)      Physical Exam Vitals and nursing note reviewed.  Constitutional:      Appearance: Normal appearance.  Abdominal:     Comments: Small 1cm subcutaneous nodule on left abdominal flank without erythema or warmth.   Skin:    Comments: Right leg with compression bandage c/d/I.  Left leg with brawny 2+ edema.   Neurological:     Mental Status: She is alert.      No results found for any visits on 08/26/23.  Last metabolic panel Lab Results  Component Value Date   GLUCOSE 99 08/15/2023   NA 139 08/15/2023   K 4.9 08/15/2023   CL 102 08/15/2023   CO2 19 (L) 08/15/2023   BUN 25 08/15/2023   CREATININE 2.34 (H) 08/15/2023   EGFR 22 (L) 08/15/2023   CALCIUM  8.4 (L) 08/15/2023   PHOS 3.3 04/05/2012   PROT 8.8 (H) 06/04/2023   ALBUMIN  4.2 06/04/2023   LABGLOB 3.8 10/05/2022   AGRATIO 1.0 (L) 07/21/2021    BILITOT 1.3 (H) 06/04/2023   ALKPHOS 118 06/04/2023   AST 23 06/04/2023   ALT 12 06/04/2023   ANIONGAP 13 06/04/2023      The ASCVD Risk score (Arnett DK, et al., 2019) failed to calculate for the following reasons:   Risk score cannot be calculated because patient has a medical history suggesting prior/existing ASCVD    Assessment & Plan:   Problem List Items Addressed This Visit       Cardiovascular and Mediastinum   Essential hypertension - Primary (Chronic)   Given lower BP, will go ahead and hold her Hydralazine  (I am unclear is she has been taking this twice a day or not) went over medications again.  Asked to bring all meds to next appointment.          Relevant Orders   BMP8+Anion Gap (Completed)     Genitourinary   Chronic kidney disease (CKD), stage III (moderate) (HCC) (Chronic)   Recheck BMP today, last BMP with slgiht elevated to SCr/ possible AKI.      Relevant Orders   BMP8+Anion Gap (Completed)     Other   Urinary incontinence (Chronic)  Doing ok with tolterodine , continue medication, reorder incontinence supplies.      Lymphedema   Reinforced need to compression, good to see right leg but she needs to find her juxtalites and use on other leg as well.       Return in about 5 weeks (around 09/30/2023) for HTN.    Dayton JAYSON Eastern, DO

## 2023-08-28 LAB — BMP8+ANION GAP
Anion Gap: 14 mmol/L (ref 10.0–18.0)
BUN/Creatinine Ratio: 11 — ABNORMAL LOW (ref 12–28)
BUN: 22 mg/dL (ref 8–27)
CO2: 18 mmol/L — ABNORMAL LOW (ref 20–29)
Calcium: 8.5 mg/dL — ABNORMAL LOW (ref 8.7–10.3)
Chloride: 102 mmol/L (ref 96–106)
Creatinine, Ser: 2.05 mg/dL — ABNORMAL HIGH (ref 0.57–1.00)
Glucose: 84 mg/dL (ref 70–99)
Potassium: 4.9 mmol/L (ref 3.5–5.2)
Sodium: 134 mmol/L (ref 134–144)
eGFR: 26 mL/min/{1.73_m2} — ABNORMAL LOW (ref >59)

## 2023-09-02 ENCOUNTER — Other Ambulatory Visit: Payer: Self-pay | Admitting: Internal Medicine

## 2023-09-02 ENCOUNTER — Telehealth: Payer: Self-pay | Admitting: *Deleted

## 2023-09-02 DIAGNOSIS — M2550 Pain in unspecified joint: Secondary | ICD-10-CM

## 2023-09-02 NOTE — Telephone Encounter (Signed)
 Will forward to C. Boone and Dr. Rosan.                                            Copied from CRM [id number]. Topic: Clinical - Order For Equipment >> Sep 02, 2023 10:08 AM Graeme ORN wrote: Reason for CRM: Patient needs provider to sign order for pull ups.

## 2023-09-02 NOTE — Telephone Encounter (Unsigned)
 Copied from CRM [id number]. Topic: Clinical - Medication Refill >> Sep 02, 2023 10:06 AM Graeme ORN wrote: Medication: oxyCODONE -acetaminophen  (PERCOCET) 10-325 MG tablet  Has the patient contacted their pharmacy? No (Agent: If no, request that the patient contact the pharmacy for the refill. If patient does not wish to contact the pharmacy document the reason why and proceed with request.) (Agent: If yes, when and what did the pharmacy advise?)  This is the patient's preferred pharmacy:  [id number] - [place], [place] - [address] AT [facility] OF [address] [place] [address] Phone: [phone] Fax: [phone]   Is this the correct pharmacy for this prescription? Yes If no, delete pharmacy and type the correct one.   Has the prescription been filled recently? Yes  Is the patient out of the medication? No  Has the patient been seen for an appointment in the last year OR does the patient have an upcoming appointment? Yes  Can we respond through MyChart? No  Agent: Please be advised that Rx refills may take up to 3 business days. We ask that you follow-up with your pharmacy.

## 2023-09-03 MED ORDER — OXYCODONE-ACETAMINOPHEN 10-325 MG PO TABS: ORAL_TABLET | ORAL | 0 refills | Status: DC

## 2023-09-03 NOTE — Assessment & Plan Note (Signed)
 Doing ok with tolterodine , continue medication, reorder incontinence supplies.

## 2023-09-05 ENCOUNTER — Ambulatory Visit: Attending: Internal Medicine | Admitting: Audiology

## 2023-09-05 DIAGNOSIS — H903 Sensorineural hearing loss, bilateral: Secondary | ICD-10-CM | POA: Insufficient documentation

## 2023-09-05 NOTE — Procedures (Signed)
  Outpatient Audiology and [facility] [address] [place], [place]  [address] [phone]  AUDIOLOGICAL  EVALUATION  NAME: Vanessa Palmer     DOB:   Feb 01, 1951      MRN: [id number]                                                                                     DATE: 09/05/2023     REFERENT: [name], DO STATUS: Outpatient DIAGNOSIS: sensorineural hearing loss, bilateral    History: [name] was seen for an audiological evaluation due to decreased hearing sensitivity. She was accompanied to the appointment by her daughter and grandson. [name] reports decreased hearing for many years. She denies otalgia and aural fullness. [name] reports her hearing sensitivity is better in the left ear and worse in the right ear.   Evaluation:  Otoscopy showed a clear view of the tympanic membranes, bilaterally Tympanometry results were consistent with normal middle ear function (Type A), bilaterally Audiometric testing was completed using Conventional Audiometry techniques with insert earphones and TDH headphones. Test results are consistent in the left ear with a moderately-severe to severe sensorineural hearing loss and consistent in the right ear with a moderately-severe to profound sensorineural hearing loss. There is an asymmetry noted at [phone] Hz, worse in the right ear. Speech Recognition Thresholds were obtained at 75 dB HL in the right ear and at 70  dB HL in the left ear. Word Recognition Testing was completed at 90 dB HL and [name] scored 44% in the right ear and at 95 dB HL and [name] scored 48% in the left ear.    Results:  The test results were reviewed with [name] and her daughter. Test results are consistent in the left ear with a moderately-severe to severe sensorineural hearing loss and consistent in the right ear with a moderately-severe to profound sensorineural hearing loss. There is an asymmetry noted at [phone] Hz, worse in the right ear. [name] will have hearing  and communication difficulty in all listening environments. She will benefit from the use of good communication strategies and the use of hearing aids. [name] was given a list of hearing aid providers in the [place] area. The use of a pocket talker (personal amplifier) was demonstrated on [name] as well.   Recommendations: 1.   Referral to an Ear, Nose, and Throat Physician due to sensorineural, asymmetric, hearing loss 2.    Use of hearing aids, amplification, or a pocket talker  3.    Monitor hearing sensitivity    30 minutes spent testing and counseling on results.   If you have any questions please feel free to contact me at [phone].  Darryle Posey Audiologist, Au.D., CCC-A 09/05/2023  12:24 PM  Cc: [name], DO

## 2023-09-08 ENCOUNTER — Inpatient Hospital Stay (HOSPITAL_COMMUNITY)
Admission: EM | Admit: 2023-09-08 | Discharge: 2023-09-12 | DRG: 565 | Disposition: A | Discharge status: Skilled Nursing Facility | Attending: Family Medicine | Admitting: Family Medicine

## 2023-09-08 ENCOUNTER — Emergency Department (HOSPITAL_COMMUNITY)

## 2023-09-08 ENCOUNTER — Other Ambulatory Visit: Payer: Self-pay

## 2023-09-08 ENCOUNTER — Encounter (HOSPITAL_COMMUNITY): Payer: Self-pay

## 2023-09-08 DIAGNOSIS — N179 Acute kidney failure, unspecified: Secondary | ICD-10-CM | POA: Diagnosis not present

## 2023-09-08 DIAGNOSIS — E860 Dehydration: Secondary | ICD-10-CM | POA: Diagnosis present

## 2023-09-08 DIAGNOSIS — T796XXA Traumatic ischemia of muscle, initial encounter: Principal | ICD-10-CM

## 2023-09-08 DIAGNOSIS — M109 Gout, unspecified: Secondary | ICD-10-CM | POA: Diagnosis present

## 2023-09-08 DIAGNOSIS — E875 Hyperkalemia: Secondary | ICD-10-CM

## 2023-09-08 DIAGNOSIS — E669 Obesity, unspecified: Secondary | ICD-10-CM | POA: Diagnosis present

## 2023-09-08 DIAGNOSIS — I129 Hypertensive chronic kidney disease with stage 1 through stage 4 chronic kidney disease, or unspecified chronic kidney disease: Secondary | ICD-10-CM | POA: Diagnosis present

## 2023-09-08 DIAGNOSIS — E872 Acidosis, unspecified: Secondary | ICD-10-CM | POA: Diagnosis present

## 2023-09-08 DIAGNOSIS — Z7901 Long term (current) use of anticoagulants: Secondary | ICD-10-CM

## 2023-09-08 DIAGNOSIS — Y92009 Unspecified place in unspecified non-institutional (private) residence as the place of occurrence of the external cause: Secondary | ICD-10-CM

## 2023-09-08 DIAGNOSIS — E785 Hyperlipidemia, unspecified: Secondary | ICD-10-CM | POA: Diagnosis present

## 2023-09-08 DIAGNOSIS — W19XXXA Unspecified fall, initial encounter: Secondary | ICD-10-CM

## 2023-09-08 DIAGNOSIS — I2510 Atherosclerotic heart disease of native coronary artery without angina pectoris: Secondary | ICD-10-CM | POA: Diagnosis present

## 2023-09-08 DIAGNOSIS — Z79899 Other long term (current) drug therapy: Secondary | ICD-10-CM

## 2023-09-08 DIAGNOSIS — Z86718 Personal history of other venous thrombosis and embolism: Secondary | ICD-10-CM

## 2023-09-08 DIAGNOSIS — Z66 Do not resuscitate: Secondary | ICD-10-CM | POA: Diagnosis present

## 2023-09-08 DIAGNOSIS — Z8249 Family history of ischemic heart disease and other diseases of the circulatory system: Secondary | ICD-10-CM

## 2023-09-08 DIAGNOSIS — Z885 Allergy status to narcotic agent status: Secondary | ICD-10-CM

## 2023-09-08 DIAGNOSIS — Z7982 Long term (current) use of aspirin: Secondary | ICD-10-CM

## 2023-09-08 DIAGNOSIS — Z833 Family history of diabetes mellitus: Secondary | ICD-10-CM

## 2023-09-08 DIAGNOSIS — Z91013 Allergy to seafood: Secondary | ICD-10-CM

## 2023-09-08 DIAGNOSIS — I890 Lymphedema, not elsewhere classified: Secondary | ICD-10-CM | POA: Diagnosis present

## 2023-09-08 DIAGNOSIS — M2550 Pain in unspecified joint: Secondary | ICD-10-CM

## 2023-09-08 DIAGNOSIS — R32 Unspecified urinary incontinence: Secondary | ICD-10-CM | POA: Diagnosis present

## 2023-09-08 DIAGNOSIS — G8929 Other chronic pain: Secondary | ICD-10-CM | POA: Diagnosis present

## 2023-09-08 DIAGNOSIS — Z6831 Body mass index (BMI) 31.0-31.9, adult: Secondary | ICD-10-CM

## 2023-09-08 DIAGNOSIS — M6282 Rhabdomyolysis: Secondary | ICD-10-CM | POA: Diagnosis present

## 2023-09-08 DIAGNOSIS — L8990 Pressure ulcer of unspecified site, unspecified stage: Secondary | ICD-10-CM | POA: Insufficient documentation

## 2023-09-08 DIAGNOSIS — Z96641 Presence of right artificial hip joint: Secondary | ICD-10-CM | POA: Diagnosis present

## 2023-09-08 DIAGNOSIS — K219 Gastro-esophageal reflux disease without esophagitis: Secondary | ICD-10-CM | POA: Diagnosis present

## 2023-09-08 DIAGNOSIS — F32A Depression, unspecified: Secondary | ICD-10-CM | POA: Diagnosis present

## 2023-09-08 DIAGNOSIS — I252 Old myocardial infarction: Secondary | ICD-10-CM

## 2023-09-08 DIAGNOSIS — Z7951 Long term (current) use of inhaled steroids: Secondary | ICD-10-CM

## 2023-09-08 DIAGNOSIS — N1832 Chronic kidney disease, stage 3b: Secondary | ICD-10-CM | POA: Diagnosis present

## 2023-09-08 DIAGNOSIS — Z888 Allergy status to other drugs, medicaments and biological substances status: Secondary | ICD-10-CM

## 2023-09-08 DIAGNOSIS — G4733 Obstructive sleep apnea (adult) (pediatric): Secondary | ICD-10-CM | POA: Diagnosis present

## 2023-09-08 DIAGNOSIS — Z7985 Long-term (current) use of injectable non-insulin antidiabetic drugs: Secondary | ICD-10-CM

## 2023-09-08 LAB — COMPREHENSIVE METABOLIC PANEL WITH GFR
ALT: 27 U/L (ref 0–44)
AST: 76 U/L — ABNORMAL HIGH (ref 15–41)
Albumin: 3.9 g/dL (ref 3.5–5.0)
Alkaline Phosphatase: 108 U/L (ref 38–126)
Anion gap: 16 — ABNORMAL HIGH (ref 5–15)
BUN: 51 mg/dL — ABNORMAL HIGH (ref 8–23)
CO2: 17 mmol/L — ABNORMAL LOW (ref 22–32)
Calcium: 9.2 mg/dL (ref 8.9–10.3)
Chloride: 104 mmol/L (ref 98–111)
Creatinine, Ser: 3.03 mg/dL — ABNORMAL HIGH (ref 0.44–1.00)
GFR, Estimated: 16 mL/min — ABNORMAL LOW
Glucose, Bld: 101 mg/dL — ABNORMAL HIGH (ref 70–99)
Potassium: 5.4 mmol/L — ABNORMAL HIGH (ref 3.5–5.1)
Sodium: 137 mmol/L (ref 135–145)
Total Bilirubin: 2.4 mg/dL — ABNORMAL HIGH (ref 0.0–1.2)
Total Protein: 8.6 g/dL — ABNORMAL HIGH (ref 6.5–8.1)

## 2023-09-08 LAB — CBC WITH DIFFERENTIAL/PLATELET
Abs Immature Granulocytes: 0.07 10*3/uL (ref 0.00–0.07)
Basophils Absolute: 0 10*3/uL (ref 0.0–0.1)
Basophils Relative: 0 %
Eosinophils Absolute: 0 10*3/uL (ref 0.0–0.5)
Eosinophils Relative: 0 %
HCT: 33.4 % — ABNORMAL LOW (ref 36.0–46.0)
Hemoglobin: 10.7 g/dL — ABNORMAL LOW (ref 12.0–15.0)
Immature Granulocytes: 1 %
Lymphocytes Relative: 7 %
Lymphs Abs: 0.6 10*3/uL — ABNORMAL LOW (ref 0.7–4.0)
MCH: 29.6 pg (ref 26.0–34.0)
MCHC: 32 g/dL (ref 30.0–36.0)
MCV: 92.3 fL (ref 80.0–100.0)
Monocytes Absolute: 0.5 10*3/uL (ref 0.1–1.0)
Monocytes Relative: 6 %
Neutro Abs: 7.8 10*3/uL — ABNORMAL HIGH (ref 1.7–7.7)
Neutrophils Relative %: 86 %
Platelets: 174 10*3/uL (ref 150–400)
RBC: 3.62 MIL/uL — ABNORMAL LOW (ref 3.87–5.11)
RDW: 14.6 % (ref 11.5–15.5)
WBC: 9 10*3/uL (ref 4.0–10.5)
nRBC: 0 % (ref 0.0–0.2)

## 2023-09-08 LAB — CK: Total CK: 2116 U/L — ABNORMAL HIGH (ref 38–234)

## 2023-09-08 MED ORDER — DIPHENHYDRAMINE HCL 50 MG/ML IJ SOLN
12.5000 mg | Freq: Once | INTRAMUSCULAR | Status: AC
  Administered 2023-09-08: 12.5 mg via INTRAVENOUS
  Filled 2023-09-08: qty 1

## 2023-09-08 MED ORDER — MORPHINE SULFATE (PF) 4 MG/ML IV SOLN
4.0000 mg | Freq: Once | INTRAVENOUS | Status: AC
  Administered 2023-09-09: 4 mg via INTRAVENOUS
  Filled 2023-09-08: qty 1

## 2023-09-08 MED ORDER — FENTANYL CITRATE PF 50 MCG/ML IJ SOSY
50.0000 ug | PREFILLED_SYRINGE | Freq: Once | INTRAMUSCULAR | Status: AC
  Administered 2023-09-08: 50 ug via INTRAVENOUS
  Filled 2023-09-08: qty 1

## 2023-09-08 MED ORDER — SODIUM CHLORIDE 0.9 % IV BOLUS
1000.0000 mL | Freq: Once | INTRAVENOUS | Status: AC
  Administered 2023-09-09: 1000 mL via INTRAVENOUS

## 2023-09-08 MED ORDER — SODIUM CHLORIDE 0.9 % IV BOLUS
500.0000 mL | Freq: Once | INTRAVENOUS | Status: AC
  Administered 2023-09-08: 500 mL via INTRAVENOUS

## 2023-09-08 NOTE — ED Provider Notes (Signed)
 [place] EMERGENCY DEPARTMENT AT [facility] Provider Note   CSN: [id number] Arrival date & time: 09/08/23  2038     Patient presents with: Vanessa Palmer is a 73 y.o. female history of lymphedema, hypertension, obesity, CAD, heart failure, here presenting with fall.  Patient is from home and lives at home by herself.  Nobody heard from her for 3 days so police was called for wellness check.  Patient apparently fell out of her motorized wheelchair but cannot tell me when she fell.  Patient was found next to her wheelchair at home.  She was unable to get up and has severe back and leg pain.   The history is provided by the patient.       Prior to Admission medications   Medication Sig Start Date End Date Taking? Authorizing Provider  aspirin  EC 81 MG tablet Take 1 tablet (81 mg total) by mouth daily. Swallow whole. 06/11/23   Addie Perkins, DO  atorvastatin  (LIPITOR ) 40 MG tablet Take 1 tablet (40 mg total) by mouth daily. 06/11/23   Addie Perkins, DO  budesonide -formoterol  (SYMBICORT ) 160-4.5 MCG/ACT inhaler Inhale 2 puffs into the lungs 3 times/day as needed-between meals & bedtime. 07/27/22   Leopold Perkins NOVAK, MD  cyclobenzaprine  (FLEXERIL ) 10 MG tablet Take 1 tablet (10 mg total) by mouth 2 (two) times daily as needed for muscle spasms. 08/09/23   Vernetta Lonni GRADE, MD  EPINEPHrine  0.3 mg/0.3 mL IJ SOAJ injection Inject 0.3 mg into the muscle as needed for anaphylaxis. 10/05/22   Leopold Perkins NOVAK, MD  gabapentin  (NEURONTIN ) 300 MG capsule Take 1 capsule (300 mg total) by mouth 3 (three) times daily. 11/14/22   Leopold Perkins NOVAK, MD  hydrOXYzine  (ATARAX ) 25 MG tablet Take 25 mg by mouth every 6 (six) hours as needed for itching.    [provider]  metoprolol  succinate (TOPROL -XL) 50 MG 24 hr tablet Take 1 tablet (50 mg total) by mouth daily. Take with or immediately following a meal. 06/11/23   Addie Perkins, DO  nitroGLYCERIN  (NITROSTAT ) 0.4 MG SL tablet Place 1  tablet (0.4 mg total) under the tongue every 5 (five) minutes as needed for chest pain. 07/04/23   [name], DO  oxyCODONE -acetaminophen  (PERCOCET) 10-325 MG tablet Take 1 tablet by mouth 2 (two) times daily as needed for pain. Take by mouth in the morning and afternoon (separated by at least 8 hours). Do not use at night. 08/06/23   [name], DO  oxyCODONE -acetaminophen  (PERCOCET) 10-325 MG tablet Take one tablet by mouth in the morning and afternoon (separated by at least 8 hours) as needed for severe pain. Do not use at night. 09/03/23   [name], DO  pantoprazole  (PROTONIX ) 40 MG tablet Take 1 tablet (40 mg total) by mouth daily. 08/15/23   [name], DO  potassium chloride  SA (KLOR-CON  M) 20 MEQ tablet Take 20 mEq by mouth daily. 09/27/22   [provider]  rivaroxaban  (XARELTO ) 20 MG TABS tablet TAKE ONE TABLET BY MOUTH DAILY WITH SUPPER 06/11/23   Addie Perkins, DO  sertraline  (ZOLOFT ) 100 MG tablet Take 1 tablet (100 mg total) by mouth daily. 04/19/23 04/18/24  Gregary Sharper, MD  sucralfate  (CARAFATE ) 1 g tablet Take 1 tablet (1 g total) by mouth 4 (four) times daily -  with meals and at bedtime. 01/31/23   Prosperi, Christian H, PA-C  telmisartan  (MICARDIS ) 20 MG tablet Take 1 tablet (20 mg total) by  mouth daily. 08/15/23   [name], DO  tirzepatide  (ZEPBOUND ) 2.5 MG/0.5ML Pen Inject 2.5 mg into the skin once a week for 28 days. 08/15/23 09/12/23  [name], DO  tirzepatide  (ZEPBOUND ) 5 MG/0.5ML Pen Inject 5 mg into the skin once a week. 09/12/23   [name], DO  tolterodine  (DETROL  LA) 2 MG 24 hr capsule Take 1 capsule (2 mg total) by mouth daily. 06/11/23   Addie Perkins, DO  torsemide  (DEMADEX ) 20 MG tablet Take 1 tablet (20 mg total) by mouth daily. 08/15/23   [name], DO    Allergies: Ace inhibitors, Other, Regadenoson , Shrimp [shellfish allergy], Peanut oil, Hydrocodone -acetaminophen , Hydromorphone  hcl, Oxycodone -acetaminophen , Propoxyphene  n-acetaminophen , and Vicodin [hydrocodone -acetaminophen ]    Review of Systems  Musculoskeletal:  Positive for back pain.       Leg pain  All other systems reviewed and are negative.   Updated Vital Signs BP (!) 189/97 (BP Location: Right Arm)   Pulse 92   Temp 97.6 F (36.4 C)   Resp 20   Ht 5' 2 (1.575 m)   Wt 78 kg   SpO2 97%   BMI 31.46 kg/m   Physical Exam Vitals and nursing note reviewed.  HENT:     Head: Normocephalic.     Comments: No obvious scalp hematoma    Nose: Nose normal.     Mouth/Throat:     Mouth: Mucous membranes are moist.  Eyes:     Extraocular Movements: Extraocular movements intact.     Pupils: Pupils are equal, round, and reactive to light.  Cardiovascular:     Rate and Rhythm: Normal rate and regular rhythm.     Pulses: Normal pulses.  Pulmonary:     Effort: Pulmonary effort is normal.     Breath sounds: Normal breath sounds.  Abdominal:     General: Abdomen is flat.     Palpations: Abdomen is soft.  Musculoskeletal:     Cervical back: Normal range of motion and neck supple.     Comments: Patient has diffuse thoracic and lumbar tenderness.  Patient has lymphedema which is chronic.  Skin:    Capillary Refill: Capillary refill takes less than 2 seconds.  Neurological:     General: No focal deficit present.     Mental Status: She is oriented to person, place, and time.  Psychiatric:        Mood and Affect: Mood normal.        Behavior: Behavior normal.     (all labs ordered are listed, but only abnormal results are displayed) Labs Reviewed  CBC WITH DIFFERENTIAL/PLATELET  COMPREHENSIVE METABOLIC PANEL WITH GFR  CK    EKG: None  Radiology: No results found.   Procedures   Medications Ordered in the ED - No data to display                                  Medical Decision Making Vanessa Palmer is a 73 y.o. female here presenting with fall.  Patient fell and has diffuse thoracic and lumbar pain.  Plan to get trauma scan  with CT head and cervical spine and CT chest and pelvis.  Also consider rhabdomyolysis so we will get a CK level as well.  11:15 PM Patient has acute renal failure with creatinine of 3.  CK level is 2000.  Trauma scan is pending.  Signed out to Dr. Theadore to  follow-up with trauma scan and patient will need admission for acute renal failure  Amount and/or Complexity of Data Reviewed Labs: ordered. Radiology: ordered.  Risk Prescription drug management.     Final diagnoses:  None    ED Discharge Orders     None          Patt Alm Macho, MD 09/08/23 2315

## 2023-09-08 NOTE — ED Triage Notes (Signed)
 BIBA from home.  PD called for wellness check after 3 days of no contact.  Clemens out of a motorized wheel chair, not sure how long ago she fell.  Generalized back pain and leg pain

## 2023-09-08 NOTE — ED Provider Notes (Signed)
  Provider Note MRN:  [id number]  Arrival date & time: 09/08/23    ED Course and Medical Decision Making  Assumed care of patient at sign-out or upon transfer.  Fall at home, possibly on the ground for 3 days.  Acute kidney injury, plan is for admission.  Trauma scans are reassuring.  Procedures  Final Clinical Impressions(s) / ED Diagnoses     ICD-10-CM   1. AKI (acute kidney injury) [facility])  N17.9       ED Discharge Orders     None       Discharge Instructions   None     Ozell HERO. Theadore, MD [facility] [email or web address]    Theadore Ozell HERO, MD 09/08/23 [phone]

## 2023-09-09 ENCOUNTER — Observation Stay (HOSPITAL_COMMUNITY)

## 2023-09-09 DIAGNOSIS — Z66 Do not resuscitate: Secondary | ICD-10-CM | POA: Diagnosis present

## 2023-09-09 DIAGNOSIS — Z96641 Presence of right artificial hip joint: Secondary | ICD-10-CM | POA: Diagnosis present

## 2023-09-09 DIAGNOSIS — M6282 Rhabdomyolysis: Secondary | ICD-10-CM | POA: Diagnosis present

## 2023-09-09 DIAGNOSIS — E860 Dehydration: Secondary | ICD-10-CM | POA: Diagnosis present

## 2023-09-09 DIAGNOSIS — N179 Acute kidney failure, unspecified: Secondary | ICD-10-CM | POA: Diagnosis present

## 2023-09-09 DIAGNOSIS — E785 Hyperlipidemia, unspecified: Secondary | ICD-10-CM | POA: Diagnosis present

## 2023-09-09 DIAGNOSIS — G4733 Obstructive sleep apnea (adult) (pediatric): Secondary | ICD-10-CM | POA: Diagnosis present

## 2023-09-09 DIAGNOSIS — Y92009 Unspecified place in unspecified non-institutional (private) residence as the place of occurrence of the external cause: Secondary | ICD-10-CM

## 2023-09-09 DIAGNOSIS — T796XXA Traumatic ischemia of muscle, initial encounter: Secondary | ICD-10-CM

## 2023-09-09 DIAGNOSIS — I2510 Atherosclerotic heart disease of native coronary artery without angina pectoris: Secondary | ICD-10-CM | POA: Diagnosis present

## 2023-09-09 DIAGNOSIS — L8990 Pressure ulcer of unspecified site, unspecified stage: Secondary | ICD-10-CM

## 2023-09-09 DIAGNOSIS — Z7982 Long term (current) use of aspirin: Secondary | ICD-10-CM | POA: Diagnosis not present

## 2023-09-09 DIAGNOSIS — Z7951 Long term (current) use of inhaled steroids: Secondary | ICD-10-CM | POA: Diagnosis not present

## 2023-09-09 DIAGNOSIS — Z7901 Long term (current) use of anticoagulants: Secondary | ICD-10-CM | POA: Diagnosis not present

## 2023-09-09 DIAGNOSIS — W19XXXA Unspecified fall, initial encounter: Secondary | ICD-10-CM

## 2023-09-09 DIAGNOSIS — I252 Old myocardial infarction: Secondary | ICD-10-CM | POA: Diagnosis not present

## 2023-09-09 DIAGNOSIS — Z86718 Personal history of other venous thrombosis and embolism: Secondary | ICD-10-CM | POA: Diagnosis not present

## 2023-09-09 DIAGNOSIS — E875 Hyperkalemia: Secondary | ICD-10-CM | POA: Diagnosis present

## 2023-09-09 DIAGNOSIS — E669 Obesity, unspecified: Secondary | ICD-10-CM | POA: Diagnosis present

## 2023-09-09 DIAGNOSIS — G8929 Other chronic pain: Secondary | ICD-10-CM | POA: Diagnosis present

## 2023-09-09 DIAGNOSIS — N1832 Chronic kidney disease, stage 3b: Secondary | ICD-10-CM | POA: Diagnosis present

## 2023-09-09 DIAGNOSIS — I129 Hypertensive chronic kidney disease with stage 1 through stage 4 chronic kidney disease, or unspecified chronic kidney disease: Secondary | ICD-10-CM | POA: Diagnosis present

## 2023-09-09 DIAGNOSIS — K219 Gastro-esophageal reflux disease without esophagitis: Secondary | ICD-10-CM | POA: Diagnosis present

## 2023-09-09 DIAGNOSIS — F32A Depression, unspecified: Secondary | ICD-10-CM | POA: Diagnosis present

## 2023-09-09 DIAGNOSIS — Z7985 Long-term (current) use of injectable non-insulin antidiabetic drugs: Secondary | ICD-10-CM | POA: Diagnosis not present

## 2023-09-09 DIAGNOSIS — Z6831 Body mass index (BMI) 31.0-31.9, adult: Secondary | ICD-10-CM | POA: Diagnosis not present

## 2023-09-09 DIAGNOSIS — Z8249 Family history of ischemic heart disease and other diseases of the circulatory system: Secondary | ICD-10-CM | POA: Diagnosis not present

## 2023-09-09 DIAGNOSIS — E872 Acidosis, unspecified: Secondary | ICD-10-CM | POA: Diagnosis present

## 2023-09-09 HISTORY — DX: Acute kidney failure, unspecified: N17.9

## 2023-09-09 HISTORY — DX: Pressure ulcer of unspecified site, unspecified stage: L89.90

## 2023-09-09 HISTORY — DX: Traumatic ischemia of muscle, initial encounter: T79.6XXA

## 2023-09-09 HISTORY — DX: Rhabdomyolysis: M62.82

## 2023-09-09 LAB — COMPREHENSIVE METABOLIC PANEL WITH GFR
ALT: 25 U/L (ref 0–44)
AST: 77 U/L — ABNORMAL HIGH (ref 15–41)
Albumin: 3.3 g/dL — ABNORMAL LOW (ref 3.5–5.0)
Alkaline Phosphatase: 95 U/L (ref 38–126)
Anion gap: 13 (ref 5–15)
BUN: 45 mg/dL — ABNORMAL HIGH (ref 8–23)
CO2: 18 mmol/L — ABNORMAL LOW (ref 22–32)
Calcium: 8.6 mg/dL — ABNORMAL LOW (ref 8.9–10.3)
Chloride: 106 mmol/L (ref 98–111)
Creatinine, Ser: 2.41 mg/dL — ABNORMAL HIGH (ref 0.44–1.00)
GFR, Estimated: 21 mL/min — ABNORMAL LOW (ref >60)
Glucose, Bld: 95 mg/dL (ref 70–99)
Potassium: 4.8 mmol/L (ref 3.5–5.1)
Sodium: 137 mmol/L (ref 135–145)
Total Bilirubin: 1.8 mg/dL — ABNORMAL HIGH (ref 0.0–1.2)
Total Protein: 7.3 g/dL (ref 6.5–8.1)

## 2023-09-09 LAB — CBC
HCT: 31.3 % — ABNORMAL LOW (ref 36.0–46.0)
Hemoglobin: 10 g/dL — ABNORMAL LOW (ref 12.0–15.0)
MCH: 29.6 pg (ref 26.0–34.0)
MCHC: 31.9 g/dL (ref 30.0–36.0)
MCV: 92.6 fL (ref 80.0–100.0)
Platelets: 146 K/uL — ABNORMAL LOW (ref 150–400)
RBC: 3.38 MIL/uL — ABNORMAL LOW (ref 3.87–5.11)
RDW: 14.5 % (ref 11.5–15.5)
WBC: 7.9 K/uL (ref 4.0–10.5)
nRBC: 0 % (ref 0.0–0.2)

## 2023-09-09 LAB — MAGNESIUM: Magnesium: 1.8 mg/dL (ref 1.7–2.4)

## 2023-09-09 LAB — PHOSPHORUS: Phosphorus: 4.5 mg/dL (ref 2.5–4.6)

## 2023-09-09 LAB — CK: Total CK: 2032 U/L — ABNORMAL HIGH (ref 38–234)

## 2023-09-09 MED ORDER — ATORVASTATIN CALCIUM 40 MG PO TABS
40.0000 mg | ORAL_TABLET | Freq: Every day | ORAL | Status: DC
  Administered 2023-09-09 – 2023-09-10 (×2): 40 mg via ORAL
  Filled 2023-09-09: qty 1
  Filled 2023-09-09: qty 4

## 2023-09-09 MED ORDER — METOPROLOL SUCCINATE ER 50 MG PO TB24
100.0000 mg | ORAL_TABLET | Freq: Every day | ORAL | Status: DC
  Administered 2023-09-09 – 2023-09-12 (×3): 100 mg via ORAL
  Filled 2023-09-09 (×4): qty 2

## 2023-09-09 MED ORDER — HYDRALAZINE HCL 25 MG PO TABS: 50.0000 mg | ORAL_TABLET | Freq: Every day | ORAL | Status: DC

## 2023-09-09 MED ORDER — FESOTERODINE FUMARATE ER 4 MG PO TB24
4.0000 mg | ORAL_TABLET | Freq: Every day | ORAL | Status: DC
  Administered 2023-09-09 – 2023-09-12 (×4): 4 mg via ORAL
  Filled 2023-09-09 (×4): qty 1

## 2023-09-09 MED ORDER — ASPIRIN 81 MG PO TBEC
81.0000 mg | DELAYED_RELEASE_TABLET | Freq: Every day | ORAL | Status: DC
Start: 2023-09-09 — End: 2023-09-12
  Administered 2023-09-09 – 2023-09-12 (×4): 81 mg via ORAL
  Filled 2023-09-09 (×4): qty 1

## 2023-09-09 MED ORDER — AMLODIPINE BESYLATE 5 MG PO TABS: 5.0000 mg | ORAL_TABLET | Freq: Every day | ORAL | Status: DC

## 2023-09-09 MED ORDER — AMLODIPINE BESYLATE 10 MG PO TABS
10.0000 mg | ORAL_TABLET | Freq: Every day | ORAL | Status: DC
  Administered 2023-09-09 – 2023-09-12 (×4): 10 mg via ORAL
  Filled 2023-09-09 (×3): qty 1
  Filled 2023-09-09: qty 2

## 2023-09-09 MED ORDER — ONDANSETRON HCL 4 MG/2ML IJ SOLN
4.0000 mg | Freq: Four times a day (QID) | INTRAMUSCULAR | Status: DC | PRN
  Administered 2023-09-11: 4 mg via INTRAVENOUS
  Filled 2023-09-09: qty 2

## 2023-09-09 MED ORDER — ACETAMINOPHEN 500 MG PO TABS
1000.0000 mg | ORAL_TABLET | Freq: Three times a day (TID) | ORAL | Status: DC
  Administered 2023-09-09 – 2023-09-12 (×8): 1000 mg via ORAL
  Filled 2023-09-09 (×10): qty 2

## 2023-09-09 MED ORDER — MORPHINE SULFATE (PF) 2 MG/ML IV SOLN
2.0000 mg | INTRAVENOUS | Status: DC | PRN
  Administered 2023-09-09 – 2023-09-11 (×3): 2 mg via INTRAVENOUS
  Filled 2023-09-09 (×3): qty 1

## 2023-09-09 MED ORDER — SODIUM CHLORIDE 0.9 % IV SOLN: INTRAVENOUS | Status: AC

## 2023-09-09 MED ORDER — PANTOPRAZOLE SODIUM 40 MG PO TBEC
40.0000 mg | DELAYED_RELEASE_TABLET | Freq: Every day | ORAL | Status: DC
Start: 2023-09-09 — End: 2023-09-12
  Administered 2023-09-09 – 2023-09-12 (×4): 40 mg via ORAL
  Filled 2023-09-09 (×4): qty 1

## 2023-09-09 MED ORDER — HYDRALAZINE HCL 20 MG/ML IJ SOLN: 10.0000 mg | INTRAMUSCULAR | Status: DC | PRN

## 2023-09-09 MED ORDER — ONDANSETRON HCL 4 MG PO TABS
4.0000 mg | ORAL_TABLET | Freq: Four times a day (QID) | ORAL | Status: DC | PRN
Start: 2023-09-09 — End: 2023-09-12

## 2023-09-09 MED ORDER — IPRATROPIUM-ALBUTEROL 0.5-2.5 (3) MG/3ML IN SOLN: 3.0000 mL | Freq: Four times a day (QID) | RESPIRATORY_TRACT | Status: DC | PRN

## 2023-09-09 MED ORDER — RIVAROXABAN 20 MG PO TABS
20.0000 mg | ORAL_TABLET | Freq: Every day | ORAL | Status: DC
  Administered 2023-09-09 – 2023-09-11 (×3): 20 mg via ORAL
  Filled 2023-09-09 (×3): qty 1

## 2023-09-09 MED ORDER — METOPROLOL SUCCINATE ER 50 MG PO TB24: 50.0000 mg | ORAL_TABLET | Freq: Every day | ORAL | Status: DC

## 2023-09-09 MED ORDER — SENNOSIDES-DOCUSATE SODIUM 8.6-50 MG PO TABS: 1.0000 | ORAL_TABLET | Freq: Every evening | ORAL | Status: DC | PRN

## 2023-09-09 NOTE — TOC Initial Note (Signed)
 Transition of Care [facility]) - Initial/Assessment Note    Patient Details  Name: Vanessa Palmer MRN: [id number] Date of Birth: 07-08-1950  [facility] of [facility]) CM/SW Contact:    Sheri ONEIDA Sharps, LCSW Phone Number: 09/09/2023, 4:57 PM  Clinical Narrative:                 Pt from home alone. Pt recommended for SNF. PASRR obtained and FL2 completed. SNF referral faxed out; awaiting bed offers.   Expected Discharge Plan: Skilled Nursing Facility Barriers to Discharge: Continued Medical Work up   Patient Goals and CMS Choice Patient states their goals for this hospitalization and ongoing recovery are:: return home following STR   Choice offered to / list presented to : Adult Children [employer] ownership interest in [facility].provided to:: Adult Children    Expected Discharge Plan and Services In-house Referral: NA Discharge Planning Services: NA Post Acute Care Choice: Skilled Nursing Facility Living arrangements for the past 2 months: Apartment                 DME Arranged: N/A DME Agency: NA       HH Arranged: NA HH Agency: NA        Prior Living Arrangements/Services Living arrangements for the past 2 months: Apartment Lives with:: Self Patient language and need for interpreter reviewed:: Yes Do you feel safe going back to the place where you live?: Yes      Need for Family Participation in Patient Care: Yes (Comment) Care giver support system in place?: Yes (comment)   Criminal Activity/Legal Involvement Pertinent to Current Situation/Hospitalization: No - Comment as needed  Activities of Daily Living   ADL Screening (condition at time of admission) Independently performs ADLs?: No Does the patient have a NEW difficulty with bathing/dressing/toileting/self-feeding that is expected to last >3 days?: Yes (Initiates electronic notice to provider for possible OT consult) Does the patient have a NEW difficulty with getting in/out of bed, walking, or  climbing stairs that is expected to last >3 days?: Yes (Initiates electronic notice to provider for possible PT consult) Does the patient have a NEW difficulty with communication that is expected to last >3 days?: Yes (Initiates electronic notice to provider for possible SLP consult) Is the patient deaf or have difficulty hearing?: Yes Does the patient have difficulty seeing, even when wearing glasses/contacts?: Yes Does the patient have difficulty concentrating, remembering, or making decisions?: Yes  Permission Sought/Granted                  Emotional Assessment Appearance:: Appears stated age     Orientation: : Oriented to Self Alcohol / Substance Use: Not Applicable Psych Involvement: No (comment)  Admission diagnosis:  AKI (acute kidney injury) (HCC) [N17.9] Acute renal failure superimposed on stage 3b chronic kidney disease, unspecified acute renal failure type (HCC) [N17.9, N18.32] Patient Active Problem List   Diagnosis Date Noted   Acute renal failure superimposed on stage 3b chronic kidney disease, unspecified acute renal failure type (HCC) 09/09/2023   Traumatic rhabdomyolysis (HCC) 09/09/2023   Hyperkalemia 09/09/2023   Fall at home, initial encounter 09/09/2023   Bilateral hearing loss 08/16/2023   OA (osteoarthritis) of bilateral knees 03/16/2023   Pectoralis muscle strain 11/14/2022   Lymphedema 07/27/2022   Spondylosis without myelopathy or radiculopathy, lumbosacral region 07/27/2022   Vertebrogenic low back pain 07/27/2022   Shellfish allergy 03/19/2018   Unilateral primary osteoarthritis, left knee 04/29/2017   Unilateral primary osteoarthritis, right knee 04/29/2017  Chronic stable angina (HCC) 02/21/2017   LVH (left ventricular hypertrophy) 08/15/2016   History of vitamin D  deficiency 05/04/2016   Status post total replacement of right hip 11/23/2014   Osteoarthritis of right hip 10/13/2014   History of DVT (deep vein thrombosis), multiple, lifelong  anticoagulation 04/04/2012   Routine health maintenance 01/14/2012   GERD (gastroesophageal reflux disease) 01/09/2012   Urinary incontinence 12/31/2011   Chronic kidney disease (CKD), stage III (moderate) (HCC) 09/19/2010   Asthma, chronic 09/27/2009   Elevated alkaline phosphatase level 08/01/2009   Pain in joint, multiple sites 12/21/2008   Major depressive disorder, recurrent episode, moderate (HCC) 09/08/2008   Gout 07/05/2008   Hyperlipidemia 05/07/2006   Coronary atherosclerosis 02/05/2006   Morbid obesity (HCC) 12/13/2005   History of alcohol abuse 12/13/2005   SLEEP APNEA, OBSTRUCTIVE 12/13/2005   Essential hypertension 12/13/2005   PCP:  [name], DO Pharmacy:   [id number] - [place], [place] - [address] AT [facility] OF [address] [place] [address] Phone: [phone] Fax: [phone]  [id number] - [place], [place] - [address] AT [facility] [address] [place] [address] Phone: [phone] Fax: [phone]     Social Drivers of Health (SDOH) Social History: SDOH Screenings   Food Insecurity: No Food Insecurity (09/09/2023)  Housing: Low Risk  (09/09/2023)  Transportation Needs: No Transportation Needs (09/09/2023)  Utilities: Not At Risk (09/09/2023)  Alcohol Screen: Low Risk  (07/27/2022)  Depression (PHQ2-9): High Risk (04/19/2023)  Financial Resource Strain: Low Risk  (07/27/2022)  Physical Activity: Inactive (07/27/2022)  Social Connections: Moderately Integrated (09/09/2023)  Stress: No Stress Concern Present (07/27/2022)  Tobacco Use: Low Risk  (09/08/2023)   SDOH Interventions:     Readmission Risk Interventions     No data to display

## 2023-09-09 NOTE — [place] FL2 (Signed)
 [place]  MEDICAID FL2 LEVEL OF CARE FORM     IDENTIFICATION  Patient Name: Vanessa Palmer Birthdate: Jul 15, 1950 Sex: female Admission Date (Current Location): 09/08/2023  [facility] and [place] Number:  Producer, television/film/video and Address:  [facility],  [address] [place], [place] [address]      Provider Number: [id number]  Attending Physician Name and Address:  Cindy Garnette POUR, MD  Relative Name and Phone Number:  Claudene Houston (Daughter)  [phone]    Current Level of Care: Hospital Recommended Level of Care: Skilled Nursing Facility Prior Approval Number:    Date Approved/Denied:   PASRR Number: [id number]  Discharge Plan: SNF    Current Diagnoses: Patient Active Problem List   Diagnosis Date Noted   Acute renal failure superimposed on stage 3b chronic kidney disease, unspecified acute renal failure type (HCC) 09/09/2023   Traumatic rhabdomyolysis (HCC) 09/09/2023   Hyperkalemia 09/09/2023   Fall at home, initial encounter 09/09/2023   Bilateral hearing loss 08/16/2023   OA (osteoarthritis) of bilateral knees 03/16/2023   Pectoralis muscle strain 11/14/2022   Lymphedema 07/27/2022   Spondylosis without myelopathy or radiculopathy, lumbosacral region 07/27/2022   Vertebrogenic low back pain 07/27/2022   Shellfish allergy 03/19/2018   Unilateral primary osteoarthritis, left knee 04/29/2017   Unilateral primary osteoarthritis, right knee 04/29/2017   Chronic stable angina (HCC) 02/21/2017   LVH (left ventricular hypertrophy) 08/15/2016   History of vitamin D  deficiency 05/04/2016   Status post total replacement of right hip 11/23/2014   Osteoarthritis of right hip 10/13/2014   History of DVT (deep vein thrombosis), multiple, lifelong anticoagulation 04/04/2012   Routine health maintenance 01/14/2012   GERD (gastroesophageal reflux disease) 01/09/2012   Urinary incontinence 12/31/2011   Chronic kidney disease (CKD), stage III (moderate) (HCC)  09/19/2010   Asthma, chronic 09/27/2009   Elevated alkaline phosphatase level 08/01/2009   Pain in joint, multiple sites 12/21/2008   Major depressive disorder, recurrent episode, moderate (HCC) 09/08/2008   Gout 07/05/2008   Hyperlipidemia 05/07/2006   Coronary atherosclerosis 02/05/2006   Morbid obesity (HCC) 12/13/2005   History of alcohol abuse 12/13/2005   SLEEP APNEA, OBSTRUCTIVE 12/13/2005   Essential hypertension 12/13/2005    Orientation RESPIRATION BLADDER Height & Weight     Self  Normal Incontinent Weight: 172 lb (78 kg) Height:  5' 2 (157.5 cm)  BEHAVIORAL SYMPTOMS/MOOD NEUROLOGICAL BOWEL NUTRITION STATUS      Incontinent Diet (see dc summary)  AMBULATORY STATUS COMMUNICATION OF NEEDS Skin   Limited Assist Verbally Normal                       Personal Care Assistance Level of Assistance  Dressing, Feeding, Bathing Bathing Assistance: Limited assistance Feeding assistance: Limited assistance Dressing Assistance: Limited assistance     Functional Limitations Info  Speech, Hearing, Sight Sight Info: Impaired Hearing Info: Adequate Speech Info: Adequate    SPECIAL CARE FACTORS FREQUENCY  PT (By licensed PT), OT (By licensed OT)     PT Frequency: 5x/wk OT Frequency: 5x/wk            Contractures Contractures Info: Not present    Additional Factors Info  Code Status, Allergies Code Status Info: DNR Allergies Info: Ace Inhibitors  Other  Regadenoson   Peanut Oil  Shellfish Allergy  Hydrocodone -acetaminophen   Hydromorphone  Hcl  Oxycodone -acetaminophen   Propoxyphene N-acetaminophen            Current Medications (09/09/2023):  This is the current hospital active medication list Current Facility-Administered  Medications  Medication Dose Route Frequency Provider Last Rate Last Admin   0.9 %  sodium chloride  infusion   Intravenous Continuous Amponsah, Prosper M, MD 150 mL/hr at 09/09/23 1650 Started During Downtime at 09/09/23 1650   acetaminophen   (TYLENOL ) tablet 1,000 mg  1,000 mg Oral TID Lou Claretta HERO, MD   1,000 mg at 09/09/23 0933   amLODipine  (NORVASC ) tablet 10 mg  10 mg Oral Daily Cindy Garnette POUR, MD   10 mg at 09/09/23 1137   aspirin  EC tablet 81 mg  81 mg Oral Daily Amponsah, Prosper M, MD   81 mg at 09/09/23 0932   atorvastatin  (LIPITOR ) tablet 40 mg  40 mg Oral Daily Amponsah, Prosper M, MD   40 mg at 09/09/23 0933   fesoterodine  (TOVIAZ ) tablet 4 mg  4 mg Oral Daily Amponsah, Prosper M, MD   4 mg at 09/09/23 9063   hydrALAZINE  (APRESOLINE ) injection 10 mg  10 mg Intravenous Q4H PRN Cindy Garnette POUR, MD       ipratropium-albuterol  (DUONEB) 0.5-2.5 (3) MG/3ML nebulizer solution 3 mL  3 mL Nebulization Q6H PRN Lou Claretta HERO, MD       metoprolol  succinate (TOPROL -XL) 24 hr tablet 100 mg  100 mg Oral Daily Cindy Garnette POUR, MD   100 mg at 09/09/23 1137   morphine  (PF) 2 MG/ML injection 2 mg  2 mg Intravenous Q4H PRN Amponsah, Prosper M, MD   2 mg at 09/09/23 0943   ondansetron  (ZOFRAN ) tablet 4 mg  4 mg Oral Q6H PRN Lou Claretta HERO, MD       Or   ondansetron  (ZOFRAN ) injection 4 mg  4 mg Intravenous Q6H PRN Amponsah, Prosper M, MD       pantoprazole  (PROTONIX ) EC tablet 40 mg  40 mg Oral Daily Amponsah, Prosper M, MD   40 mg at 09/09/23 9066   rivaroxaban  (XARELTO ) tablet 20 mg  20 mg Oral Q supper Amponsah, Prosper M, MD       senna-docusate (Senokot-S) tablet 1 tablet  1 tablet Oral QHS PRN Amponsah, Prosper M, MD         Discharge Medications: Please see discharge summary for a list of discharge medications.  Relevant Imaging Results:  Relevant Lab Results:   Additional Information SSN [id number]  Vanessa ONEIDA Sharps, LCSW

## 2023-09-09 NOTE — H&P (Incomplete)
 History and Physical    Patient: Vanessa Palmer FMW:[id number] DOB: 03-13-1950 DOA: 09/08/2023 DOS: the patient was seen and examined on 09/09/2023 PCP: [name], DO  Patient coming from: {Point_of_Origin:[address]777}  Chief Complaint:  Chief Complaint  Patient presents with  . Fall   HPI: Vanessa Palmer is a 73 y.o. female with medical history significant of ***  Review of Systems: {ROS_Text:26778} Past Medical History:  Diagnosis Date  . ALCOHOL ABUSE 12/13/2005   Annotation: Sober since 11/06 Qualifier: Diagnosis of  By: Elnor MD, [place]    . Anemia   . Arthritis    all over (02/21/2017)  . CAD (coronary artery disease)    s/p non-Q wave MI in 06/2001 and 2004, 2005  . CHF (congestive heart failure) (HCC)   . Chronic kidney disease (CKD), stage III (moderate) (HCC) 09/19/2010  . Chronic mid back pain   . Depression   . DJD (degenerative joint disease)   . DVT (deep venous thrombosis) (HCC)    BLE  . DVT of lower extremity, bilateral (HCC) 04/04/2012   On Xarelto     . GERD (gastroesophageal reflux disease)   . Gout   . Hyperlipidemia   . Hypertension   . INTRINSIC ASTHMA, WITH EXACERBATION 09/27/2009   Qualifier: Diagnosis of  By: Danella MD, Nodira    . Migraine    none anymore (02/21/2017)  . Myocardial infarction [facility]) ?2005  . Obesity   . OSA (obstructive sleep apnea)    previously used CPAP, has lost it (02/21/2017)  . Seizures (HCC)    As a teenager    Past Surgical History:  Procedure Laterality Date  . ABDOMINAL HYSTERECTOMY     partial; due to endometriosis  . CARDIAC CATHETERIZATION  06/2001, 02/2002, 10/2004    (10-15% proximal stenosis of circumflex, diffuse disease of  OM1 and RCA)  . CARDIAC CATHETERIZATION  02/21/2017  . COLONOSCOPY    . JOINT REPLACEMENT    . KNEE ARTHROSCOPY Left   . LEFT HEART CATH AND CORONARY ANGIOGRAPHY N/A 02/21/2017   Procedure: LEFT HEART CATH AND CORONARY ANGIOGRAPHY;  Surgeon: Levern Hutching, MD;  Location: [facility]  INVASIVE CV LAB;  Service: Cardiovascular;  Laterality: N/A;  . TOTAL HIP ARTHROPLASTY Right 11/23/2014   Procedure: RIGHT TOTAL HIP ARTHROPLASTY ANTERIOR APPROACH;  Surgeon: Lonni CINDERELLA Poli, MD;  Location: [facility] OR;  Service: Orthopedics;  Laterality: Right;  . TUBAL LIGATION     Social History:  reports that she has never smoked. She has never used smokeless tobacco. She reports that she does not currently use alcohol after a past usage of about 12.0 standard drinks of alcohol per week. She reports that she does not use drugs.  Allergies  Allergen Reactions  . Ace Inhibitors Swelling and Other (See Comments)     Angioedema   . Other Anaphylaxis, Itching and Swelling    Dragonfruit  . Regadenoson  Hives    LEXISCAN   . Shrimp [Shellfish Allergy] Anaphylaxis, Shortness Of Breath and Rash    Had to be taken to the ED when she was exposed to this  . Peanut Oil Itching, Rash and Other (See Comments)    Welts, also  . Hydrocodone -Acetaminophen  Itching  . Hydromorphone  Hcl Rash and Other (See Comments)    Diffuse rash  . Oxycodone -Acetaminophen  Itching    Tolerates with Benadryl   . Propoxyphene N-Acetaminophen  Itching  . Vicodin [Hydrocodone -Acetaminophen ] Itching    Family History  Problem Relation Age of Onset  . Mental illness Mother   .  Heart disease Father   . Diabetes Sister   . Heart disease Sister   . Diabetes Sister   . Mental illness Sister   . Heart disease Brother   . Hypertension Daughter   . Heart disease Daughter     Prior to Admission medications   Medication Sig Start Date End Date Taking? Authorizing Provider  aspirin  EC 81 MG tablet Take 1 tablet (81 mg total) by mouth daily. Swallow whole. 06/11/23   Addie Perkins, DO  atorvastatin  (LIPITOR ) 40 MG tablet Take 1 tablet (40 mg total) by mouth daily. 06/11/23   Addie Perkins, DO  budesonide -formoterol  (SYMBICORT ) 160-4.5 MCG/ACT inhaler Inhale 2 puffs into the lungs 3 times/day as needed-between meals & bedtime.  07/27/22   Leopold Perkins NOVAK, MD  cyclobenzaprine  (FLEXERIL ) 10 MG tablet Take 1 tablet (10 mg total) by mouth 2 (two) times daily as needed for muscle spasms. 08/09/23   Vernetta Lonni GRADE, MD  EPINEPHrine  0.3 mg/0.3 mL IJ SOAJ injection Inject 0.3 mg into the muscle as needed for anaphylaxis. 10/05/22   Leopold Perkins NOVAK, MD  gabapentin  (NEURONTIN ) 300 MG capsule Take 1 capsule (300 mg total) by mouth 3 (three) times daily. 11/14/22   Leopold Perkins NOVAK, MD  hydrOXYzine  (ATARAX ) 25 MG tablet Take 25 mg by mouth every 6 (six) hours as needed for itching.    [provider]  metoprolol  succinate (TOPROL -XL) 50 MG 24 hr tablet Take 1 tablet (50 mg total) by mouth daily. Take with or immediately following a meal. 06/11/23   Addie Perkins, DO  nitroGLYCERIN  (NITROSTAT ) 0.4 MG SL tablet Place 1 tablet (0.4 mg total) under the tongue every 5 (five) minutes as needed for chest pain. 07/04/23   [name], DO  oxyCODONE -acetaminophen  (PERCOCET) 10-325 MG tablet Take 1 tablet by mouth 2 (two) times daily as needed for pain. Take by mouth in the morning and afternoon (separated by at least 8 hours). Do not use at night. 08/06/23   [name], DO  oxyCODONE -acetaminophen  (PERCOCET) 10-325 MG tablet Take one tablet by mouth in the morning and afternoon (separated by at least 8 hours) as needed for severe pain. Do not use at night. 09/03/23   [name], DO  pantoprazole  (PROTONIX ) 40 MG tablet Take 1 tablet (40 mg total) by mouth daily. 08/15/23   [name], DO  potassium chloride  SA (KLOR-CON  M) 20 MEQ tablet Take 20 mEq by mouth daily. 09/27/22   [provider]  rivaroxaban  (XARELTO ) 20 MG TABS tablet TAKE ONE TABLET BY MOUTH DAILY WITH SUPPER 06/11/23   Addie Perkins, DO  sertraline  (ZOLOFT ) 100 MG tablet Take 1 tablet (100 mg total) by mouth daily. 04/19/23 04/18/24  Gregary Sharper, MD  sucralfate  (CARAFATE ) 1 g tablet Take 1 tablet (1 g total) by mouth 4 (four) times daily -  with meals and  at bedtime. 01/31/23   Prosperi, Christian H, PA-C  telmisartan  (MICARDIS ) 20 MG tablet Take 1 tablet (20 mg total) by mouth daily. 08/15/23   [name], DO  tirzepatide  (ZEPBOUND ) 2.5 MG/0.5ML Pen Inject 2.5 mg into the skin once a week for 28 days. 08/15/23 09/12/23  [name], DO  tirzepatide  (ZEPBOUND ) 5 MG/0.5ML Pen Inject 5 mg into the skin once a week. 09/12/23   [name], DO  tolterodine  (DETROL  LA) 2 MG 24 hr capsule Take 1 capsule (2 mg total) by mouth daily. 06/11/23   Addie Perkins, DO  torsemide  (DEMADEX ) 20 MG  tablet Take 1 tablet (20 mg total) by mouth daily. 08/15/23   [name], DO    Physical Exam: Vitals:   09/08/23 2047 09/08/23 2050 09/08/23 2230  BP: (!) 189/97  (!) 156/129  Pulse: 92  97  Resp: 20  (!) 0  Temp: 97.6 F (36.4 C)    SpO2: 97%  99%  Weight:  78 kg   Height:  5' 2 (1.575 m)    *** Data Reviewed: {Tip this will not be part of the note when signed- Document your independent interpretation of telemetry tracing, EKG, lab, Radiology test or any other diagnostic tests. Add any new diagnostic test ordered today. (Optional):26781} {Results:26384}  Assessment and Plan: No notes have been filed under this hospital service. Service: Hospitalist     Advance Care Planning:   Code Status: Prior ***  Consults: ***  Family Communication: ***  Severity of Illness: {Observation/Inpatient:21159}  Author: Claretta CHRISTELLA Alderman, MD 09/09/2023 12:01 AM  For on call review [email or web address].

## 2023-09-09 NOTE — Progress Notes (Signed)
 Progress Note   Patient: Vanessa Palmer FMW:[id number] DOB: 1950/11/30 DOA: 09/08/2023     0 DOS: the patient was seen and examined on 09/09/2023   Brief hospital course: 73 y.o. female with medical history significant for HTN, CAD, CKD 3B, DVT on Xarelto , GERD, HLD, asthma, lymphedema, urine incontinence, osteoarthritis, chronic pain, OSA and depression who presented via EMS after recent fall.  Patient sleepy nonambulant most of her words but does report falling off her motorized wheelchair. She does not remember the exact day of the fall but states she was unable to reach her phone to call someone.  Patient was not heard from for 3 days so police was called for a welfare check and patient was found on the floor. She reports neck, back and leg pain but denies any chest pain, shortness of breath, fevers or chills.  States she lives alone but her granddaughter lives in the area.   ED Course: Initial vitals show patient hypertensive with SBP in the 150-180s otherwise stable vital. Initial labs significant for K+ 5.4, bicarb 17, glucose 101, BUN/creatinine 51/2.03, anion gap 16, bilirubin 2.4, WBC 11.0, Hgb 10.7, platelet 174, CK 2116. EKG shows sinus rhythm with slight peaking of the T waves.  Trauma scans are negative with CT head, CT cervical spine and CT C/A/P.  Pt received IV NS 1.5 L bolus, IV morphine  4 mg x 1, IV fentanyl  50 mcg x 1 and IV Benadryl  12.5 mg x 1. TR[facility] was consulted for admission.   Assessment and Plan: # AKI on CKD 3B # AGMA # Traumatic rhabdomyolysis - Creatinine elevated to 3.03, from baseline of 1.7-2.0, bicarb 17, AG 16 - CK minimally elevated to >2000 - AKI likely secondary to rhabdomyolysis and dehydration - Pt had been continued on 150 cc/hr - Renal function improving - Avoid nephrotoxic meds   # Fall - Presented after unwitnessed fall at home - Trauma scan with no acute abnormality - Continues to have mild neck, back and lower extremity soreness - As needed  Tylenol  and IV morphine  for pain - PT/OT recs for SNF noted - Fall precautions   # HTN - BP elevated with SBP in the 150s to 180s likely secondary to acute pain - Continue amlodipine  and Toprol  XL -Cont PRN hydralazine  IV   # Hyperkalemia - K+ elevated to 5.4 on admission secondary to rhabdomyolysis, AKI and home potassium supplementation -Normalized with hydration -recheck in AM   #Hx of DVT - Continue Xarelto    # CAD # HLD - Continue aspirin  and atorvastatin    # Lymphedema - Hold Lasix  in the setting of AKI   # Urinary incontinence -Continue tolterodine  (fesoterodine  as substitute)   # GERD - Continue Protonix        Subjective: Complaining of bilateral ankle pains. Wanting something to eat  Physical Exam: Vitals:   09/09/23 1400 09/09/23 1500 09/09/23 1600 09/09/23 1652  BP: (!) 156/91 (!) 155/84 (!) 146/84 139/72  Pulse: 76   71  Resp: (!) 0 (!) 0 (!) 0 18  Temp:    97.6 F (36.4 C)  TempSrc:    Oral  SpO2: 100%   98%  Weight:      Height:       General exam: Awake, laying in bed, in nad Respiratory system: Normal respiratory effort, no wheezing Cardiovascular system: regular rate, s1, s2 Gastrointestinal system: Soft, nondistended, positive BS Central nervous system: CN2-12 grossly intact, strength intact Extremities: Perfused, no clubbing, B ankle pains Skin: Normal skin turgor,  no notable skin lesions seen Psychiatry: Mood normal // no visual hallucinations   Data Reviewed:  Labs reviewed: Na 137, K 4.8, Cr 2.41, AST 77, ALT 25, WBC 7.9, Hgb 10.0, Plts 146  Family Communication: Pt in room, family not at bedside  Disposition: Status is: Observation The patient will require care spanning > 2 midnights and should be moved to inpatient because: severity of illness  Planned Discharge Destination: Skilled nursing facility    Author: Garnette Pelt, MD 09/09/2023 5:11 PM  For on call review [email or web address].

## 2023-09-09 NOTE — H&P (Addendum)
 History and Physical  Vanessa Palmer FMW:[id number] DOB: 08-13-1950 DOA: 09/08/2023  PCP: [name], DO   Chief Complaint: Fall  HPI: Vanessa Palmer is a 73 y.o. female with medical history significant for HTN, CAD, CKD 3B, DVT on Xarelto , GERD, HLD, asthma, lymphedema, urine incontinence, osteoarthritis, chronic pain, OSA and depression who presented via EMS after recent fall.  Patient sleepy nonambulant most of her words but does report falling off her motorized wheelchair. She does not remember the exact day of the fall but states she was unable to reach her phone to call someone.  Patient was not heard from for 3 days so police was called for a welfare check and patient was found on the floor. She reports neck, back and leg pain but denies any chest pain, shortness of breath, fevers or chills.  States she lives alone but her granddaughter lives in the area.  ED Course: Initial vitals show patient hypertensive with SBP in the 150-180s otherwise stable vital. Initial labs significant for K+ 5.4, bicarb 17, glucose 101, BUN/creatinine 51/2.03, anion gap 16, bilirubin 2.4, WBC 11.0, Hgb 10.7, platelet 174, CK 2116. EKG shows sinus rhythm with slight peaking of the T waves.  Trauma scans are negative with CT head, CT cervical spine and CT C/A/P.  Pt received IV NS 1.5 L bolus, IV morphine  4 mg x 1, IV fentanyl  50 mcg x 1 and IV Benadryl  12.5 mg x 1. [facility] was consulted for admission.   Review of Systems: Please see HPI for pertinent positives and negatives. A complete 10 system review of systems are otherwise negative.  Past Medical History:  Diagnosis Date   ALCOHOL ABUSE 12/13/2005   Annotation: Sober since 11/06 Qualifier: Diagnosis of  By: Elnor MD, Comer     Anemia    Arthritis    all over (02/21/2017)   CAD (coronary artery disease)    s/p non-Q wave MI in 06/2001 and 2004, 2005   CHF (congestive heart failure) (HCC)    Chronic kidney disease (CKD), stage III (moderate) (HCC)  09/19/2010   Chronic mid back pain    Depression    DJD (degenerative joint disease)    DVT (deep venous thrombosis) (HCC)    BLE   DVT of lower extremity, bilateral (HCC) 04/04/2012   On Xarelto      GERD (gastroesophageal reflux disease)    Gout    Hyperlipidemia    Hypertension    INTRINSIC ASTHMA, WITH EXACERBATION 09/27/2009   Qualifier: Diagnosis of  By: Danella MD, Nodira     Migraine    none anymore (02/21/2017)   Myocardial infarction [facility]) ?2005   Obesity    OSA (obstructive sleep apnea)    previously used CPAP, has lost it (02/21/2017)   Seizures (HCC)    As a teenager    Past Surgical History:  Procedure Laterality Date   ABDOMINAL HYSTERECTOMY     partial; due to endometriosis   CARDIAC CATHETERIZATION  06/2001, 02/2002, 10/2004    (10-15% proximal stenosis of circumflex, diffuse disease of  OM1 and RCA)   CARDIAC CATHETERIZATION  02/21/2017   COLONOSCOPY     JOINT REPLACEMENT     KNEE ARTHROSCOPY Left    LEFT HEART CATH AND CORONARY ANGIOGRAPHY N/A 02/21/2017   Procedure: LEFT HEART CATH AND CORONARY ANGIOGRAPHY;  Surgeon: Levern Hutching, MD;  Location: [facility];  Service: Cardiovascular;  Laterality: N/A;   TOTAL HIP ARTHROPLASTY Right 11/23/2014   Procedure: RIGHT TOTAL HIP  ARTHROPLASTY ANTERIOR APPROACH;  Surgeon: Lonni CINDERELLA Poli, MD;  Location: [facility] OR;  Service: Orthopedics;  Laterality: Right;   TUBAL LIGATION     Social History:  reports that she has never smoked. She has never used smokeless tobacco. She reports that she does not currently use alcohol after a past usage of about 12.0 standard drinks of alcohol per week. She reports that she does not use drugs.  Allergies  Allergen Reactions   Ace Inhibitors Swelling and Other (See Comments)     Angioedema    Other Anaphylaxis, Itching and Swelling    Dragonfruit   Regadenoson  Hives    LEXISCAN    Shrimp [Shellfish Allergy] Anaphylaxis, Shortness Of Breath and Rash    Had to be taken to  the ED when she was exposed to this   Peanut Oil Itching, Rash and Other (See Comments)    Welts, also   Hydrocodone -Acetaminophen  Itching   Hydromorphone  Hcl Rash and Other (See Comments)    Diffuse rash   Oxycodone -Acetaminophen  Itching    Tolerates with Benadryl    Propoxyphene N-Acetaminophen  Itching   Vicodin [Hydrocodone -Acetaminophen ] Itching    Family History  Problem Relation Age of Onset   Mental illness Mother    Heart disease Father    Diabetes Sister    Heart disease Sister    Diabetes Sister    Mental illness Sister    Heart disease Brother    Hypertension Daughter    Heart disease Daughter      Prior to Admission medications   Medication Sig Start Date End Date Taking? Authorizing Provider  aspirin  EC 81 MG tablet Take 1 tablet (81 mg total) by mouth daily. Swallow whole. 06/11/23   Addie Perkins, DO  atorvastatin  (LIPITOR ) 40 MG tablet Take 1 tablet (40 mg total) by mouth daily. 06/11/23   Addie Perkins, DO  budesonide -formoterol  (SYMBICORT ) 160-4.5 MCG/ACT inhaler Inhale 2 puffs into the lungs 3 times/day as needed-between meals & bedtime. 07/27/22   Leopold Perkins NOVAK, MD  cyclobenzaprine  (FLEXERIL ) 10 MG tablet Take 1 tablet (10 mg total) by mouth 2 (two) times daily as needed for muscle spasms. 08/09/23   Poli Lonni CINDERELLA, MD  EPINEPHrine  0.3 mg/0.3 mL IJ SOAJ injection Inject 0.3 mg into the muscle as needed for anaphylaxis. 10/05/22   Leopold Perkins NOVAK, MD  gabapentin  (NEURONTIN ) 300 MG capsule Take 1 capsule (300 mg total) by mouth 3 (three) times daily. 11/14/22   Leopold Perkins NOVAK, MD  hydrOXYzine  (ATARAX ) 25 MG tablet Take 25 mg by mouth every 6 (six) hours as needed for itching.    [provider]  metoprolol  succinate (TOPROL -XL) 50 MG 24 hr tablet Take 1 tablet (50 mg total) by mouth daily. Take with or immediately following a meal. 06/11/23   Addie Perkins, DO  nitroGLYCERIN  (NITROSTAT ) 0.4 MG SL tablet Place 1 tablet (0.4 mg total) under the tongue every 5  (five) minutes as needed for chest pain. 07/04/23   [name], DO  oxyCODONE -acetaminophen  (PERCOCET) 10-325 MG tablet Take 1 tablet by mouth 2 (two) times daily as needed for pain. Take by mouth in the morning and afternoon (separated by at least 8 hours). Do not use at night. 08/06/23   [name], DO  oxyCODONE -acetaminophen  (PERCOCET) 10-325 MG tablet Take one tablet by mouth in the morning and afternoon (separated by at least 8 hours) as needed for severe pain. Do not use at night. 09/03/23   [name], DO  pantoprazole  (PROTONIX ) 40  MG tablet Take 1 tablet (40 mg total) by mouth daily. 08/15/23   [name], DO  potassium chloride  SA (KLOR-CON  M) 20 MEQ tablet Take 20 mEq by mouth daily. 09/27/22   [provider]  rivaroxaban  (XARELTO ) 20 MG TABS tablet TAKE ONE TABLET BY MOUTH DAILY WITH SUPPER 06/11/23   Addie Perkins, DO  sertraline  (ZOLOFT ) 100 MG tablet Take 1 tablet (100 mg total) by mouth daily. 04/19/23 04/18/24  Gregary Sharper, MD  sucralfate  (CARAFATE ) 1 g tablet Take 1 tablet (1 g total) by mouth 4 (four) times daily -  with meals and at bedtime. 01/31/23   Prosperi, Christian H, PA-C  telmisartan  (MICARDIS ) 20 MG tablet Take 1 tablet (20 mg total) by mouth daily. 08/15/23   [name], DO  tirzepatide  (ZEPBOUND ) 2.5 MG/0.5ML Pen Inject 2.5 mg into the skin once a week for 28 days. 08/15/23 09/12/23  [name], DO  tirzepatide  (ZEPBOUND ) 5 MG/0.5ML Pen Inject 5 mg into the skin once a week. 09/12/23   [name], DO  tolterodine  (DETROL  LA) 2 MG 24 hr capsule Take 1 capsule (2 mg total) by mouth daily. 06/11/23   Addie Perkins, DO  torsemide  (DEMADEX ) 20 MG tablet Take 1 tablet (20 mg total) by mouth daily. 08/15/23   [name], DO    Physical Exam: BP (!) 156/129   Pulse 97   Temp 97.6 F (36.4 C)   Resp (!) 0   Ht 5' 2 (1.575 m)   Wt 78 kg   SpO2 99%   BMI 31.46 kg/m  General: Sleepy and weak appearing elderly woman laying in bed. No  acute distress. Neck: C-collar in place.  Mild C-spine tenderness. HEENT: [place]/AT. Anicteric sclera.  Dry mucous membrane. CV: Mild tachycardia.  Regular rhythm. No murmurs, rubs, or gallops.  2+ BLE edema Pulmonary: Lungs CTAB. Normal effort. No wheezing or rales. Abdominal: Soft, nontender, nondistended. Normal bowel sounds. Extremities: Chronic lymphedema of the lower extremities. Mild tenderness to palpation of the lower extremities. Skin: Warm and dry. No obvious rash or lesions. Neuro: Drowsy but oriented x 3. Moves all extremities. Normal sensation to light touch. No focal deficit. Psych: Normal mood and affect          Labs on Admission:  Basic Metabolic Panel: Recent Labs  Lab 09/08/23 2109  NA 137  K 5.4*  CL 104  CO2 17*  GLUCOSE 101*  BUN 51*  CREATININE 3.03*  CALCIUM  9.2   Liver Function Tests: Recent Labs  Lab 09/08/23 2109  AST 76*  ALT 27  ALKPHOS 108  BILITOT 2.4*  PROT 8.6*  ALBUMIN  3.9   No results for input(s): LIPASE, AMYLASE in the last 168 hours. No results for input(s): AMMONIA in the last 168 hours. CBC: Recent Labs  Lab 09/08/23 2109  WBC 9.0  NEUTROABS 7.8*  HGB 10.7*  HCT 33.4*  MCV 92.3  PLT 174   Cardiac Enzymes: Recent Labs  Lab 09/08/23 2109  CKTOTAL 2,116*   BNP (last 3 results) Recent Labs    01/31/23 1402 06/04/23 2033  BNP 34.2 108.6*    ProBNP (last 3 results) No results for input(s): PROBNP in the last 8760 hours.  CBG: No results for input(s): GLUCAP in the last 168 hours.  Radiological Exams on Admission: CT T-SPINE NO CHARGE Result Date: 09/08/2023 CLINICAL DATA:  Trauma EXAM: CT THORACIC SPINE WITHOUT CONTRAST TECHNIQUE: Multidetector CT images of the thoracic were obtained using the standard protocol  without intravenous contrast. RADIATION DOSE REDUCTION: This exam was performed according to the departmental dose-optimization program which includes automated exposure control, adjustment of the  mA and/or kV according to patient size and/or use of iterative reconstruction technique. COMPARISON:  Chest CT 02/09/2008. FINDINGS: Alignment: Normal. Vertebrae: No acute fracture or focal pathologic process. Paraspinal and other soft tissues: Negative. Disc levels: There is mild disc space narrowing throughout the lumbar spine with endplate osteophytes compatible with degenerative change. There is no central canal or neural foraminal stenosis identified. IMPRESSION: 1. No acute fracture or traumatic subluxation of the thoracic spine. 2. Mild degenerative changes. Electronically Signed   By: Greig Pique M.D.   On: 09/08/2023 23:31   CT CHEST ABDOMEN PELVIS WO CONTRAST Result Date: 09/08/2023 CLINICAL DATA:  Recent fall with chest and abdominal pain, initial encounter EXAM: CT CHEST, ABDOMEN AND PELVIS WITHOUT CONTRAST TECHNIQUE: Multidetector CT imaging of the chest, abdomen and pelvis was performed following the standard protocol without IV contrast. RADIATION DOSE REDUCTION: This exam was performed according to the departmental dose-optimization program which includes automated exposure control, adjustment of the mA and/or kV according to patient size and/or use of iterative reconstruction technique. COMPARISON:  11/17/2022 FINDINGS: CT CHEST FINDINGS Cardiovascular: Somewhat limited due to lack of IV contrast. Mild atherosclerotic calcifications are seen. Mild dilatation of the ascending aorta to 4.2 cm is noted. Normal tapering in the thoracic aortic arch is seen. Descending aorta is unremarkable. No hyperdense crescent to suggest acute aortic abnormality is seen. Pulmonary artery is within normal limits as visualized. No cardiac enlargement is seen. Mediastinum/Nodes: Thoracic inlet is within normal limits. No hilar or mediastinal adenopathy is noted. The esophagus as visualized is within normal limits. Lungs/Pleura: Lungs are well aerated bilaterally. No focal infiltrate or sizable effusion is noted. No  parenchymal nodules are seen. Musculoskeletal: Degenerative changes of the thoracic spine are seen. No acute rib abnormality is noted. CT ABDOMEN PELVIS FINDINGS Hepatobiliary: No focal liver abnormality is seen. No gallstones, gallbladder wall thickening, or biliary dilatation. Pancreas: Unremarkable. No pancreatic ductal dilatation or surrounding inflammatory changes. Spleen: Normal in size without focal abnormality. Adrenals/Urinary Tract: Adrenal glands are within normal limits. Kidneys show a normal appearance without renal calculi or obstructive change. The ureters are within normal limits. The bladder is well distended. Stomach/Bowel: Scattered diverticular change of the colon is seen. No obstructive or inflammatory changes of the colon are noted. The appendix is within normal limits. Small bowel and stomach are within normal limits. Vascular/Lymphatic: Aortic atherosclerosis. No enlarged abdominal or pelvic lymph nodes. Reproductive: Status post hysterectomy. No adnexal masses. Other: No free fluid or focal herniation is noted. Musculoskeletal: Degenerative changes of lumbar spine are seen. No acute abnormality is noted. Stable L4 hemangioma is noted. Right hip replacement is noted. IMPRESSION: CT of the chest: Mild dilatation of the ascending aorta to 4.2 cm. Recommend annual imaging followup by CTA or MRA. This recommendation follows 2010 ACCF/AHA/AATS/ACR/ASA/SCA/SCAI/SIR/STS/SVM Guidelines for the Diagnosis and Management of Patients with Thoracic Aortic Disease. Circulation. 2010; 121: Z733-z630. Aortic aneurysm NOS (ICD10-I71.9) No posttraumatic abnormality is seen. CT of the abdomen and pelvis: Diverticulosis without diverticulitis. No posttraumatic abnormality is seen. Electronically Signed   By: Oneil Devonshire M.D.   On: 09/08/2023 23:31   CT L-SPINE NO CHARGE Result Date: 09/08/2023 CLINICAL DATA:  Blunt polytrauma EXAM: CT LUMBAR SPINE WITHOUT CONTRAST TECHNIQUE: Multidetector CT imaging of the  lumbar spine was performed without intravenous contrast administration. Multiplanar CT image reconstructions were also generated. RADIATION DOSE  REDUCTION: This exam was performed according to the departmental dose-optimization program which includes automated exposure control, adjustment of the mA and/or kV according to patient size and/or use of iterative reconstruction technique. COMPARISON:  Lumbar spine radiographs 11/17/2022 and CT 11/17/2022 FINDINGS: Segmentation: 5 lumbar type vertebrae. Alignment: No evidence of traumatic listhesis. Vertebrae: No acute fracture. Demineralization. Similar prominent trabecula and sclerosis in the L4 vertebral body suggesting hemangioma. Paraspinal and other soft tissues: Reported separately. Disc levels: Disc space height loss and degenerative endplate changes are greatest at L4-L5 where there is vacuum phenomenon. Moderate multilevel facet arthropathy. There is moderate to severe spinal canal narrowing at L4-L5. Neural foraminal narrowing is greatest on the right at L4-L5 where it is moderate. IMPRESSION: No acute fracture in the lumbar spine. Electronically Signed   By: Norman Gatlin M.D.   On: 09/08/2023 23:25   CT HEAD WO CONTRAST ( ) Result Date: 09/08/2023 CLINICAL DATA:  Head trauma, intracranial venous injury suspected; Neck trauma (Age >= 65y) EXAM: CT HEAD WITHOUT CONTRAST CT CERVICAL SPINE WITHOUT CONTRAST TECHNIQUE: Multidetector CT imaging of the head and cervical spine was performed following the standard protocol without intravenous contrast. Multiplanar CT image reconstructions of the cervical spine were also generated. RADIATION DOSE REDUCTION: This exam was performed according to the departmental dose-optimization program which includes automated exposure control, adjustment of the mA and/or kV according to patient size and/or use of iterative reconstruction technique. COMPARISON:  None Available. FINDINGS: CT HEAD FINDINGS Brain: No evidence of  acute infarction, hemorrhage, hydrocephalus, extra-axial collection or mass lesion/mass effect. Vascular: Calcific atherosclerosis. No hyperdense vessel identified. Skull: No acute fracture. Sinuses/Orbits: Clear sinuses.  No acute orbital findings. Other: No mastoid effusions. CT CERVICAL SPINE FINDINGS Alignment: No substantial sagittal subluxation. Skull base and vertebrae: Vertebral body heights are maintained. No evidence of acute fracture. Soft tissues and spinal canal: No prevertebral fluid or swelling. No visible canal hematoma. Disc levels: Moderate to severe multilevel degenerative change including degenerative disc disease, endplate spurring and facet and uncovertebral hypertrophy with varying degrees of neural foraminal stenosis. Upper chest: Visualized lung apices are clear. Other: None. IMPRESSION: 1. No evidence of acute intracranial abnormality. 2. No evidence of acute fracture or traumatic malalignment in the cervical spine. Electronically Signed   By: Gilmore GORMAN Molt M.D.   On: 09/08/2023 23:25   CT Cervical Spine Wo Contrast Result Date: 09/08/2023 CLINICAL DATA:  Head trauma, intracranial venous injury suspected; Neck trauma (Age >= 65y) EXAM: CT HEAD WITHOUT CONTRAST CT CERVICAL SPINE WITHOUT CONTRAST TECHNIQUE: Multidetector CT imaging of the head and cervical spine was performed following the standard protocol without intravenous contrast. Multiplanar CT image reconstructions of the cervical spine were also generated. RADIATION DOSE REDUCTION: This exam was performed according to the departmental dose-optimization program which includes automated exposure control, adjustment of the mA and/or kV according to patient size and/or use of iterative reconstruction technique. COMPARISON:  None Available. FINDINGS: CT HEAD FINDINGS Brain: No evidence of acute infarction, hemorrhage, hydrocephalus, extra-axial collection or mass lesion/mass effect. Vascular: Calcific atherosclerosis. No hyperdense  vessel identified. Skull: No acute fracture. Sinuses/Orbits: Clear sinuses.  No acute orbital findings. Other: No mastoid effusions. CT CERVICAL SPINE FINDINGS Alignment: No substantial sagittal subluxation. Skull base and vertebrae: Vertebral body heights are maintained. No evidence of acute fracture. Soft tissues and spinal canal: No prevertebral fluid or swelling. No visible canal hematoma. Disc levels: Moderate to severe multilevel degenerative change including degenerative disc disease, endplate spurring and facet and uncovertebral hypertrophy with varying degrees  of neural foraminal stenosis. Upper chest: Visualized lung apices are clear. Other: None. IMPRESSION: 1. No evidence of acute intracranial abnormality. 2. No evidence of acute fracture or traumatic malalignment in the cervical spine. Electronically Signed   By: Gilmore GORMAN Molt M.D.   On: 09/08/2023 23:25   Assessment/Plan Vanessa Palmer is a 73 y.o. female with medical history significant for  HTN, CAD, CKD 3B, DVT on Xarelto , GERD, HLD, asthma, lymphedema, urine incontinence, osteoarthritis, chronic pain, OSA and depression who presented via EMS after recent fall and admitted for AKI on CKD and rhabdomyolysis  # AKI on CKD 3B # AGMA # Traumatic rhabdomyolysis - Creatinine elevated to 3.03, from baseline of 1.7-2.0, bicarb 17, AG 16 - CK minimally elevated to >2000 - AKI secondary to rhabdomyolysis and dehydration - Start IV NS at 150 cc/hr for 1 day - Follow-up morning CMP, CK, mag and Phos - Avoid nephrotoxic meds  # Fall - Presented after unwitnessed fall at home - Trauma scan with no acute abnormality - Continues to have mild neck, back and lower extremity soreness - As needed Tylenol  and IV morphine  for pain - PT/OT eval and treat - Fall precautions  # HTN - BP elevated with SBP in the 150s to 180s likely secondary to acute pain - Continue amlodipine , hydralazine  and Toprol  XL - Hold atorvastatin  in the setting of  AKI  # Hyperkalemia - K+ elevated to 5.4 on admission secondary to rhabdomyolysis, AKI and home potassium supplementation - Continue IV hydration and follow-up repeat K+ - Hold potassium supplementation  #Hx of DVT - Continue Xarelto   # CAD # HLD - Continue aspirin  and atorvastatin   # Lymphedema - Hold Lasix  in the setting of AKI  # Urinary incontinence -Continue tolterodine  (fesoterodine  as substitute)  # GERD - Continue Protonix   DVT prophylaxis: Xarelto     Code Status: Limited: Do not attempt resuscitation (DNR) -DNR-LIMITED -Do Not Intubate/DNI   Consults called: None  Family Communication: No family at bedside  Severity of Illness: The appropriate patient status for this patient is OBSERVATION. Observation status is judged to be reasonable and necessary in order to provide the required intensity of service to ensure the patient's safety. The patient's presenting symptoms, physical exam findings, and initial radiographic and laboratory data in the context of their medical condition is felt to place them at decreased risk for further clinical deterioration. Furthermore, it is anticipated that the patient will be medically stable for discharge from the hospital within 2 midnights of admission.   Level of care: Telemetry   This record has been created using Conservation officer, historic buildings. Errors have been sought and corrected, but may not always be located. Such creation errors do not reflect on the standard of care.   Lou Claretta HERO, MD 09/09/2023, 12:01 AM [facility] Pager: [phone] Isaiah 41:10   If 7PM-7AM, please contact night-coverage [email or web address] Password [email or web address]

## 2023-09-09 NOTE — Hospital Course (Signed)
 Vanessa Palmer is a 73 y.o. female with a history of hypertension, CAD, CKD stage IIIb, DVT, GERD, hyperlipidemia, asthma, lymphedema, urine incontinence, osteoarthritis, chronic pain, depression.  Patient presented secondary to fall and generalized pain, found to have rhabdomyolysis with associated AKI. Patient managed with IV fluids.

## 2023-09-09 NOTE — Evaluation (Signed)
 Occupational Therapy Evaluation Patient Details Name: Vanessa Palmer MRN: [id number] DOB: 05/08/1950 Today's Date: 09/09/2023   History of Present Illness   Vanessa Palmer is a 73 y.o. female with medical history significant for HTN, CAD, CKD 3B, DVT on Xarelto , GERD, HLD, asthma, lymphedema, urine incontinence, osteoarthritis, chronic pain, OSA and depression who presented via EMS after recent fall from her power chair, down x ~ 3 days , not heard from. so police was called for a welfare check and patient was found on the floor. She reports neck, back and leg pain but denies any chest pain, shortness of breath, fevers or chills.  States she lives alone but her granddaughter lives in the area. Ed dx AKI, traumatic rhabdomyolysis     Clinical Impressions PTA, patient lives at her elevator access apartment alone with use of power wheelchair from mobility, PCA for assisting with bathing, access to grocery store via w/c (patient reports next to [employer])  and family support from outside home. Currently, patient presents with deficits outlined below (see OT Problem List for details) most significantly increased body habitus, B LE edema, pain, poor activity tolerance, decreased cognition, s/s of anxiety, and generalized muscle weakness limiting BADL's and functional mobility.  Patient requires continued Acute care hospital level OT services to progress safety and functional performance and allow for discharge. Patient will benefit from continued inpatient follow up therapy, <3 hours/day.        If plan is discharge home, recommend the following:   Two people to help with walking and/or transfers;Two people to help with bathing/dressing/bathroom;Assistance with cooking/housework;Assistance with feeding;Direct supervision/assist for medications management;Direct supervision/assist for financial management;Assist for transportation;Help with stairs or ramp for entrance;Supervision due to cognitive  status     Functional Status Assessment   Patient has had a recent decline in their functional status and demonstrates the ability to make significant improvements in function in a reasonable and predictable amount of time.     Equipment Recommendations   Other (comment) (TBA at rehab venue)     Recommendations for Other Services         Precautions/Restrictions   Precautions Precautions: Fall Precaution/Restrictions Comments: unsure Restrictions Weight Bearing Restrictions Per Provider Order: No     Mobility Bed Mobility Overal bed mobility: Needs Assistance Bed Mobility: Rolling Rolling: +2 for physical assistance, +2 for safety/equipment, Total assist         General bed mobility comments: total assist to roll to each side, patient does reach  toward rails    Transfers                   General transfer comment: unable to assess due to reports of  pain      Balance                                           ADL either performed or assessed with clinical judgement   ADL Overall ADL's : Needs assistance/impaired Eating/Feeding: NPO   Grooming: Wash/dry hands;Wash/dry face;Maximal assistance;Bed level   Upper Body Bathing: Total assistance;Bed level   Lower Body Bathing: Total assistance;Bed level   Upper Body Dressing : Total assistance;Bed level   Lower Body Dressing: Total assistance;Bed level   Toilet Transfer:  (unable to mobilize from ED [place])   Toileting- Clothing Manipulation and Hygiene: Total assistance;Bed level       Functional  mobility during ADLs: +2 for physical assistance;+2 for safety/equipment;Total assistance General ADL Comments: unable to progress greater than face washing bed level     Vision Baseline Vision/History: 0 No visual deficits Ability to See in Adequate Light: 0 Adequate Patient Visual Report: No change from baseline Vision Assessment?: No apparent visual deficits Additional  Comments: grossly WFL's for basic tasks and clock on far wall            Pertinent Vitals/Pain Pain Assessment Pain Assessment: Faces Faces Pain Scale: Hurts worst Pain Location: when  attempts to move the legs and body, moans, crying Pain Descriptors / Indicators: Contraction, Crying, Grimacing, Discomfort, Moaning, Guarding Pain Intervention(s): Limited activity within patient's tolerance, Monitored during session, Premedicated before session, Repositioned, Relaxation     Extremity/Trunk Assessment Upper Extremity Assessment Upper Extremity Assessment: Right hand dominant;RUE deficits/detail;LUE deficits/detail (difficulty assessing due to significant pain overall, unable to reach to head Bly, support proximally to reach cloth to face grossly 3-/5 Bly,  + B grasp 3/5) RUE Coordination: decreased fine motor;decreased gross motor LUE Coordination: decreased gross motor;decreased fine motor   Lower Extremity Assessment Lower Extremity Assessment: Defer to PT evaluation RLE Deficits / Details: limited in moving the legs, reports pain in the knees LLE Deficits / Details: similar to the right, limited by pain LLE: Unable to fully assess due to pain   Cervical / Trunk Assessment Cervical / Trunk Assessment: Other exceptions Cervical / Trunk Exceptions: cervical neck collor in place   Communication Communication Communication: Impaired Factors Affecting Communication: Reduced clarity of speech   Cognition Arousal: Alert Behavior During Therapy: Anxious Cognition: Cognition impaired   Orientation impairments: Situation, Time Awareness: Intellectual awareness impaired, Online awareness impaired Memory impairment (select all impairments): Short-term memory Attention impairment (select first level of impairment): Sustained attention Executive functioning impairment (select all impairments): Sequencing, Reasoning, Problem solving, Organization OT - Cognition Comments: patient anxious  and in pain with difficulty with focus and history, cognition to be further assessed once pain better controlled                 Following commands: Intact       Cueing  General Comments   Cueing Techniques: Verbal cues;Gestural cues;Tactile cues  B LE lymphedema, pain with all mobility and increased BP           Home Living Family/patient expects to be discharged to:: Private residence Living Arrangements: Alone Available Help at Discharge: Family;Friend(s) Type of Home: Apartment Home Access: Elevator     Home Layout: One level     Bathroom Shower/Tub: Tub/shower unit         Home Equipment: Agricultural consultant (2 wheels);Cane - single point;Tub bench   Additional Comments: PCA assists with bath      Prior Functioning/Environment Prior Level of Function : Needs assist             Mobility Comments: transfers to power chair ADLs Comments: PCA assists with bath and meals,    OT Problem List: Decreased strength;Decreased range of motion;Decreased activity tolerance;Impaired balance (sitting and/or standing);Decreased coordination;Decreased cognition;Decreased safety awareness;Decreased knowledge of precautions;Decreased knowledge of use of DME or AE;Cardiopulmonary status limiting activity;Obesity;Impaired UE functional use;Pain;Increased edema   OT Treatment/Interventions: Self-care/ADL training;Therapeutic exercise;Neuromuscular education;Energy conservation;DME and/or AE instruction;Therapeutic activities;Cognitive remediation/compensation;Patient/family education;Balance training      OT Goals(Current goals can be found in the care plan section)   Acute Rehab OT Goals Patient Stated Goal: to feel better OT Goal Formulation: With patient Time For Goal Achievement: 09/23/23  Potential to Achieve Goals: Fair ADL Goals Pt Will Perform Eating: with supervision;sitting;with adaptive utensils Pt Will Perform Grooming: with supervision;sitting Pt Will  Perform Upper Body Bathing: with min assist;sitting Pt Will Perform Upper Body Dressing: with min assist;sitting Pt Will Transfer to Toilet: with +2 assist;with mod assist;bedside commode Pt/caregiver will Perform Home Exercise Program: Both right and left upper extremity;Increased strength;With written HEP provided;With Supervision   OT Frequency:  Min 2X/week    Co-evaluation   Reason for Co-Treatment: Complexity of the patient's impairments (multi-system involvement);For patient/therapist safety PT goals addressed during session: Mobility/safety with mobility OT goals addressed during session: ADL's and self-care      AM-PAC OT 6 Clicks Daily Activity     Outcome Measure Help from another person eating meals?: Total Help from another person taking care of personal grooming?: Total Help from another person toileting, which includes using toliet, bedpan, or urinal?: Total Help from another person bathing (including washing, rinsing, drying)?: Total Help from another person to put on and taking off regular upper body clothing?: Total Help from another person to put on and taking off regular lower body clothing?: Total 6 Click Score: 6   End of Session Equipment Utilized During Treatment: Cervical collar Nurse Communication: Mobility status  Activity Tolerance: Patient limited by pain Patient left: in bed;with call bell/phone within reach  OT Visit Diagnosis: Other abnormalities of gait and mobility (R26.89);History of falling (Z91.81);Muscle weakness (generalized) (M62.81);Feeding difficulties (R63.3);Cognitive communication deficit (R41.841);Pain Pain - part of body:  (all over)                Time: 9154-9089 OT Time Calculation (min): 25 min Charges:  OT General Charges $OT Visit: 1 Visit OT Evaluation $OT Eval Low Complexity: 1 Low  Vernida Mcnicholas OT/L Acute Rehabilitation Department  [phone]  09/09/2023, 2:52 PM

## 2023-09-09 NOTE — Evaluation (Addendum)
 Physical Therapy Evaluation Patient Details Name: Vanessa Palmer MRN: [id number] DOB: 12-24-1950 Today's Date: 09/09/2023  History of Present Illness  Vanessa Palmer is a 73 y.o. female with medical history significant for HTN, CAD, CKD 3B, DVT on Xarelto , GERD, HLD, asthma, lymphedema, urine incontinence, osteoarthritis, chronic pain, OSA and depression who presented via EMS after recent fall from her power chair, down x ~ 3 days , not heard from. so police was called for a welfare check and patient was found on the floor. She reports neck, back and leg pain but denies any chest pain, shortness of breath, fevers or chills.  States she lives alone but her granddaughter lives in the area. Ed dx AKI, traumatic rhabdomyolysis,  Clinical Impression  Pt admitted with above diagnosis.  Pt currently with functional limitations due to the deficits listed below (see PT Problem List). Pt will benefit from acute skilled PT to increase their independence and safety with mobility to allow discharge.     PT evaluation limited to rolling  to each side. Patient reporting significant pain in the legs and back with mobility. Neck brace in  place. Patient demonstrates limited LE  tolerance to active/Passisve movement, noted  edema of both  legs. Patient very limited in mobility this visit.  Patient reports lives alone in an apartment, able to transfer to her motorized WC independently. Reports having PCA person assists with bath , reports that she gets her groceries via her motorized [place] which is close to [employer].  Patient is very soft spoken and at times does not answer questions, at times required stimulation to engage in  therapists questions and directions.   Patient will benefit from continued inpatient follow up therapy, <3 hours/day.  BP running high 169/100, 975 on RA, HR 79    If plan is discharge home, recommend the following: Two people to help with walking and/or transfers;Two people to help with  bathing/dressing/bathroom;Assistance with cooking/housework;Assist for transportation;Help with stairs or ramp for entrance   Can travel by private vehicle        Equipment Recommendations None recommended by PT  Recommendations for Other Services       Functional Status Assessment Patient has had a recent decline in their functional status and demonstrates the ability to make significant improvements in function in a reasonable and predictable amount of time.     Precautions / Restrictions Precautions Precautions: Fall Precaution/Restrictions Comments: unsure Restrictions Weight Bearing Restrictions Per Provider Order: No      Mobility  Bed Mobility Overal bed mobility: Needs Assistance Bed Mobility: Rolling Rolling: +2 for physical assistance, +2 for safety/equipment, Total assist         General bed mobility comments: total assist to roll to each side, patient does reach  toward rails    Transfers                   General transfer comment: unable to assess due to reports of  pain    Ambulation/Gait                  Stairs            Wheelchair Mobility     Tilt Bed    Modified Rankin (Stroke Patients Only)       Balance  Pertinent Vitals/Pain Pain Assessment Pain Assessment: Faces Faces Pain Scale: Hurts worst Pain Location: when  attempts to move the legs and body, moans, crying Pain Descriptors / Indicators: Contraction, Crying, Grimacing, Discomfort, Moaning, Guarding Pain Intervention(s): Limited activity within patient's tolerance, Repositioned    Home Living Family/patient expects to be discharged to:: Private residence Living Arrangements: Alone Available Help at Discharge: Family;Friend(s) Type of Home: Apartment Home Access: Elevator       Home Layout: One level Home Equipment: Agricultural consultant (2 wheels);Cane - single point;Tub bench Additional  Comments: PCA assists with bath    Prior Function               Mobility Comments: transfers to power chair ADLs Comments: PCA assists with bath and meals,     Extremity/Trunk Assessment        Lower Extremity Assessment Lower Extremity Assessment: LLE deficits/detail;RLE deficits/detail RLE Deficits / Details: limited in moving the legs, reports pain in the knees LLE Deficits / Details: similar to the right, limited by pain LLE: Unable to fully assess due to pain    Cervical / Trunk Assessment Cervical / Trunk Assessment: Other exceptions Cervical / Trunk Exceptions: cervical neck collor in place  Communication   Communication Communication: Impaired Factors Affecting Communication: Reduced clarity of speech    Cognition Arousal: Alert Behavior During Therapy: Anxious   PT - Cognitive impairments: No family/caregiver present to determine baseline, Difficult to assess, Orientation   Orientation impairments: Time                   PT - Cognition Comments: oriented to month and yr Following commands: Intact       Cueing Cueing Techniques: Verbal cues, Gestural cues, Tactile cues     General Comments General comments (skin integrity, edema, etc.): unable to assess    Exercises     Assessment/Plan    PT Assessment Patient needs continued PT services  PT Problem List Decreased strength;Decreased range of motion;Cardiopulmonary status limiting activity;Decreased activity tolerance;Decreased balance;Decreased mobility;Decreased knowledge of precautions;Decreased safety awareness;Pain       PT Treatment Interventions DME instruction;Therapeutic exercise;Functional mobility training;Therapeutic activities;Patient/family education;Cognitive remediation    PT Goals (Current goals can be found in the Care Plan section)  Acute Rehab PT Goals Patient Stated Goal: agreed to attempt mobility PT Goal Formulation: With patient Time For Goal Achievement:  09/23/23 Potential to Achieve Goals: Fair    Frequency Min 2X/week     Co-evaluation PT/OT/SLP Co-Evaluation/Treatment: Yes Reason for Co-Treatment: Complexity of the patient's impairments (multi-system involvement);For patient/therapist safety PT goals addressed during session: Mobility/safety with mobility OT goals addressed during session: ADL's and self-care       AM-PAC PT 6 Clicks Mobility  Outcome Measure Help needed turning from your back to your side while in a flat bed without using bedrails?: Total Help needed moving from lying on your back to sitting on the side of a flat bed without using bedrails?: Total Help needed moving to and from a bed to a chair (including a wheelchair)?: Total Help needed standing up from a chair using your arms (e.g., wheelchair or bedside chair)?: Total Help needed to walk in hospital room?: Total Help needed climbing 3-5 steps with a railing? : Total 6 Click Score: 6    End of Session   Activity Tolerance: Patient limited by pain Patient left: in bed Nurse Communication: Mobility status PT Visit Diagnosis: Unsteadiness on feet (R26.81);Difficulty in walking, not elsewhere classified (R26.2);History of falling (Z91.81)  Time: 9155-9089 PT Time Calculation (min) (ACUTE ONLY): 26 min   Charges:   PT Evaluation $PT Eval Low Complexity: 1 Low   PT General Charges $$ ACUTE PT VISIT: 1 Visit         Vanessa Palmer PT Acute Rehabilitation Services Office [phone]   Vanessa Palmer 09/09/2023, 2:31 PM

## 2023-09-10 DIAGNOSIS — N179 Acute kidney failure, unspecified: Secondary | ICD-10-CM | POA: Diagnosis not present

## 2023-09-10 DIAGNOSIS — N1832 Chronic kidney disease, stage 3b: Secondary | ICD-10-CM | POA: Diagnosis not present

## 2023-09-10 LAB — CBC
HCT: 30.2 % — ABNORMAL LOW (ref 36.0–46.0)
Hemoglobin: 9.6 g/dL — ABNORMAL LOW (ref 12.0–15.0)
MCH: 29.4 pg (ref 26.0–34.0)
MCHC: 31.8 g/dL (ref 30.0–36.0)
MCV: 92.4 fL (ref 80.0–100.0)
Platelets: 137 K/uL — ABNORMAL LOW (ref 150–400)
RBC: 3.27 MIL/uL — ABNORMAL LOW (ref 3.87–5.11)
RDW: 14.6 % (ref 11.5–15.5)
WBC: 5.9 K/uL (ref 4.0–10.5)
nRBC: 0 % (ref 0.0–0.2)

## 2023-09-10 LAB — COMPREHENSIVE METABOLIC PANEL WITH GFR
ALT: 26 U/L (ref 0–44)
AST: 73 U/L — ABNORMAL HIGH (ref 15–41)
Albumin: 2.9 g/dL — ABNORMAL LOW (ref 3.5–5.0)
Alkaline Phosphatase: 82 U/L (ref 38–126)
Anion gap: 6 (ref 5–15)
BUN: 35 mg/dL — ABNORMAL HIGH (ref 8–23)
CO2: 21 mmol/L — ABNORMAL LOW (ref 22–32)
Calcium: 8.3 mg/dL — ABNORMAL LOW (ref 8.9–10.3)
Chloride: 109 mmol/L (ref 98–111)
Creatinine, Ser: 1.8 mg/dL — ABNORMAL HIGH (ref 0.44–1.00)
GFR, Estimated: 30 mL/min — ABNORMAL LOW (ref >60)
Glucose, Bld: 86 mg/dL (ref 70–99)
Potassium: 4 mmol/L (ref 3.5–5.1)
Sodium: 136 mmol/L (ref 135–145)
Total Bilirubin: 1.7 mg/dL — ABNORMAL HIGH (ref 0.0–1.2)
Total Protein: 6.6 g/dL (ref 6.5–8.1)

## 2023-09-10 LAB — CK: Total CK: 1439 U/L — ABNORMAL HIGH (ref 38–234)

## 2023-09-10 MED ORDER — CYCLOBENZAPRINE HCL 10 MG PO TABS
10.0000 mg | ORAL_TABLET | Freq: Two times a day (BID) | ORAL | Status: DC | PRN
  Administered 2023-09-10 – 2023-09-11 (×2): 10 mg via ORAL
  Filled 2023-09-10 (×3): qty 1

## 2023-09-10 MED ORDER — ENSURE PLUS HIGH PROTEIN PO LIQD
237.0000 mL | Freq: Two times a day (BID) | ORAL | Status: DC
  Administered 2023-09-10: 237 mL via ORAL

## 2023-09-10 NOTE — TOC Progression Note (Signed)
 Transition of Care [facility]) - Progression Note    Patient Details  Name: Vanessa Palmer MRN: [id number] Date of Birth: 1950/03/29  Transition of Care [facility]) CM/SW Contact  Sheri ONEIDA [place], [place] Phone Number: 09/10/2023, 4:06 PM  Clinical Narrative:    Bed choice [facility]. Ins auth started; pending approval.    Expected Discharge Plan: Skilled Nursing Facility Barriers to Discharge: Continued Medical Work up  Expected Discharge Plan and Services In-house Referral: NA Discharge Planning Services: NA Post Acute Care Choice: Skilled Nursing Facility Living arrangements for the past 2 months: Apartment                 DME Arranged: N/A DME Agency: NA       HH Arranged: NA HH Agency: NA         Social Determinants of Health (SDOH) Interventions SDOH Screenings   Food Insecurity: No Food Insecurity (09/09/2023)  Housing: Low Risk  (09/09/2023)  Transportation Needs: No Transportation Needs (09/09/2023)  Utilities: Not At Risk (09/09/2023)  Alcohol Screen: Low Risk  (07/27/2022)  Depression (PHQ2-9): High Risk (04/19/2023)  Financial Resource Strain: Low Risk  (07/27/2022)  Physical Activity: Inactive (07/27/2022)  Social Connections: Moderately Integrated (09/09/2023)  Stress: No Stress Concern Present (07/27/2022)  Tobacco Use: Low Risk  (09/08/2023)    Readmission Risk Interventions    09/10/2023    4:06 PM  Readmission Risk Prevention Plan  Transportation Screening Complete  PCP or Specialist Appt within 5-7 Days Complete  Home Care Screening Complete  Medication Review (RN CM) Complete

## 2023-09-10 NOTE — Evaluation (Signed)
 Speech Language Pathology Evaluation Patient Details Name: Vanessa Palmer MRN: [id number] DOB: [birth year] Today's Date: 09/10/2023 Time: 8684-8654 SLP Time Calculation (min) (ACUTE ONLY): 30 min  Problem List:  Patient Active Problem List   Diagnosis Date Noted   Acute renal failure superimposed on stage 3b chronic kidney disease, unspecified acute renal failure type (HCC) 09/09/2023   Traumatic rhabdomyolysis (HCC) 09/09/2023   Hyperkalemia 09/09/2023   Fall at home, initial encounter 09/09/2023   Rhabdomyolysis 09/09/2023   Pressure injury of skin 09/09/2023   Bilateral hearing loss 08/16/2023   OA (osteoarthritis) of bilateral knees 03/16/2023   Pectoralis muscle strain 11/14/2022   Lymphedema 07/27/2022   Spondylosis without myelopathy or radiculopathy, lumbosacral region 07/27/2022   Vertebrogenic low back pain 07/27/2022   Shellfish allergy 03/19/2018   Unilateral primary osteoarthritis, left knee 04/29/2017   Unilateral primary osteoarthritis, right knee 04/29/2017   Chronic stable angina (HCC) 02/21/2017   LVH (left ventricular hypertrophy) 08/15/2016   History of vitamin D  deficiency 05/04/2016   Status post total replacement of right hip 11/23/2014   Osteoarthritis of right hip 10/13/2014   History of DVT (deep vein thrombosis), multiple, lifelong anticoagulation 04/04/2012   Routine health maintenance 01/14/2012   GERD (gastroesophageal reflux disease) 01/09/2012   Urinary incontinence 12/31/2011   Chronic kidney disease (CKD), stage III (moderate) (HCC) 09/19/2010   Asthma, chronic 09/27/2009   Elevated alkaline phosphatase level 08/01/2009   Pain in joint, multiple sites 12/21/2008   Major depressive disorder, recurrent episode, moderate (HCC) 09/08/2008   Gout 07/05/2008   Hyperlipidemia 05/07/2006   Coronary atherosclerosis 02/05/2006   Morbid obesity (HCC) 12/13/2005   History of alcohol abuse 12/13/2005   SLEEP APNEA, OBSTRUCTIVE 12/13/2005   Essential  hypertension 12/13/2005   Past Medical History:  Past Medical History:  Diagnosis Date   ALCOHOL ABUSE 12/13/2005   Annotation: Sober since 11/06 Qualifier: Diagnosis of  By: Elnor MD, Com[place]     Anemia    Arthritis    all over (02/21/2017)   CAD (coronary artery disease)    s/p non-Q wave MI in 06/2001 and 2004, 2005   CHF (congestive heart failure) (HCC)    Chronic kidney disease (CKD), stage III (moderate) (HCC) 09/19/2010   Chronic mid back pain    Depression    DJD (degenerative joint disease)    DVT (deep venous thrombosis) (HCC)    BLE   DVT of lower extremity, bilateral (HCC) 04/04/2012   On Xarelto      GERD (gastroesophageal reflux disease)    Gout    Hyperlipidemia    Hypertension    INTRINSIC ASTHMA, WITH EXACERBATION 09/27/2009   Qualifier: Diagnosis of  By: Danella MD, Nodira     Migraine    none anymore (02/21/2017)   Myocardial infarction [facility]) ?2005   Obesity    OSA (obstructive sleep apnea)    previously used CPAP, has lost it (02/21/2017)   Seizures (HCC)    As a teenager    Past Surgical History:  Past Surgical History:  Procedure Laterality Date   ABDOMINAL HYSTERECTOMY     partial; due to endometriosis   CARDIAC CATHETERIZATION  06/2001, 02/2002, 10/2004    (10-15% proximal stenosis of circumflex, diffuse disease of  OM1 and RCA)   CARDIAC CATHETERIZATION  02/21/2017   COLONOSCOPY     JOINT REPLACEMENT     KNEE ARTHROSCOPY Left    LEFT HEART CATH AND CORONARY ANGIOGRAPHY N/A 02/21/2017   Procedure: LEFT HEART CATH AND CORONARY ANGIOGRAPHY;  Surgeon: Levern Hutching, MD;  Location[facility] INVASIVE CV LAB;  Service: Cardiovascular;  Laterality: N/A;   TOTAL HIP ARTHROPLASTY Right 11/23/2014   Procedure: RIGHT TOTAL HIP ARTHROPLASTY ANTERIOR APPROACH;  Surgeon: Lonni CINDERELLA Poli, MD;  Location: [facility] OR;  Service: Orthopedics;  Laterality: Right;   TUBAL LIGATION     HPI:  pt is a 73 yo adm to [facility] after being found down at home after several days. Pt  reports she has an aid that comes in to help her but she manages her own bills, medications, etc.  Pt diagnosed with rhabdomylosis - Speech/language evaluation ordered.  PMH + for ETOH use (sober since Oct 2024 per pt) - total use of ETOH approx 35 years *minus four in between*, renal disease,wounds, gout, GERD, HLD, sleep apnea - OSA, DVT, asthma, cardiac issues.  Brain image negative upon admission.  Brain imaging April 2025 when pt had HA showed Patchy and confluent areas of decreased attenuation are noted  throughout the deep and periventricular white matter of the cerebral  hemispheres bilaterally, compatible with chronic microvascular  ischemic disease.   Assessment / Plan / Recommendation Clinical Impression  Patient demonstrates significant cognitive linguistic deficits demonstrating impaired attention, problem solving and awareness.  SLUMS was attempted, but pt's poor attention abilities did not allow this to be conducted.  She needed several repetitions of questions and at times did not answer SLP. SLP spoke directly to pt to assure she could hear - given h/o suspected hearing loss.  Moderate dysarthria also noted characterized by rapid rate of imprecise speech with decreased phonatory strength resulting in decreased intelligiblity for phrase level or multisyllabic words. She benefited from verbal cues to SLOW rate of speech - to improve intelligibility and to speak louder.  No family present to establish baseline - and pt admits to significant changes with her attention and speech/language skills with this event.   Question impact of prior ETOH abuse and depression on current function. 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  Will follow up for functional cognitive linguistic and dysarthria treatment to maximize pt's rehab.    SLP Assessment  SLP Recommendation/Assessment: Patient needs continued Speech  Language Pathology Services SLP Visit Diagnosis: Dysarthria and anarthria (R47.1);Cognitive communication deficit (R41.841)     Assistance Recommended at Discharge  Frequent or constant Supervision/Assistance  Functional Status Assessment Patient has had a recent decline in their functional status and demonstrates the ability to make significant improvements in function in a reasonable and predictable amount of time.  Frequency and Duration min 1 x/week  1 week      SLP Evaluation Cognition  Orientation Level: Oriented to person;Disoriented to time;Oriented to place;Oriented to situation Year: Other (Comment) (did not answer) Attention: Sustained Sustained Attention: Impaired Memory: Impaired (pt with compromised attention requiring frequent repetitions of basic directions - SLP assured she could hear during evaluation -) Memory Impairment: Storage deficit;Retrieval deficit Awareness: Impaired Awareness Impairment: Intellectual impairment Problem Solving: Impaired       Comprehension  Auditory Comprehension Overall Auditory Comprehension: Impaired Yes/No Questions: Not tested Commands: Impaired One Step Basic Commands: 75-100% accurate Two Step Basic Commands: 50-74% accurate Conversation: Simple Interfering Components: Attention;Processing speed;Working Radio broadcast assistant: Extra processing time;Other (Comment);Stressing words Visual Recognition/Discrimination Discrimination: Not tested Reading Comprehension Reading Status: Not tested (reading glasses not present)    Expression Expression Primary Mode of Expression: Verbal Verbal Expression Overall Verbal Expression: Impaired  Initiation: Impaired Level of Generative/Spontaneous Verbalization: Word Repetition:  (short sentence level repeated with delay and intial sound repetition) Naming: Not tested Pragmatics: No impairment Interfering Components: Attention;Speech intelligibility Non-Verbal Means of  Communication: Not applicable Written Expression Dominant Hand: Right Written Expression: Not tested   Oral / Motor  Oral Motor/Sensory Function Overall Oral Motor/Sensory Function: Within functional limits Motor Speech Overall Motor Speech: Impaired Respiration: Impaired Level of Impairment: Phrase Phonation: Low vocal intensity Resonance: Within functional limits Articulation: Impaired Level of Impairment: Phrase Intelligibility: Intelligibility reduced Word: 50-74% accurate Phrase: 50-74% accurate Sentence: 25-49% accurate Conversation: Not tested Motor Planning: Not tested Effective Techniques: Slow rate;Over-articulate       Madelin POUR, MS [facility] SLP Acute Rehab Services Office [phone]        Nicolas Emmie Caldron 09/10/2023, 4:57 PM

## 2023-09-10 NOTE — Progress Notes (Signed)
 Progress Note   Patient: Vanessa Palmer FMW:[id number] DOB: 09/03/1950 DOA: 09/08/2023     1 DOS: the patient was seen and examined on 09/10/2023   Brief hospital course: 73 y.o. female with medical history significant for HTN, CAD, CKD 3B, DVT on Xarelto , GERD, HLD, asthma, lymphedema, urine incontinence, osteoarthritis, chronic pain, OSA and depression who presented via EMS after recent fall.  Patient sleepy nonambulant most of her words but does report falling off her motorized wheelchair. She does not remember the exact day of the fall but states she was unable to reach her phone to call someone.  Patient was not heard from for 3 days so police was called for a welfare check and patient was found on the floor. She reports neck, back and leg pain but denies any chest pain, shortness of breath, fevers or chills.  States she lives alone but her granddaughter lives in the area.   ED Course: Initial vitals show patient hypertensive with SBP in the 150-180s otherwise stable vital. Initial labs significant for K+ 5.4, bicarb 17, glucose 101, BUN/creatinine 51/2.03, anion gap 16, bilirubin 2.4, WBC 11.0, Hgb 10.7, platelet 174, CK 2116. EKG shows sinus rhythm with slight peaking of the T waves.  Trauma scans are negative with CT head, CT cervical spine and CT C/A/P.  Pt received IV NS 1.5 L bolus, IV morphine  4 mg x 1, IV fentanyl  50 mcg x 1 and IV Benadryl  12.5 mg x 1. TRH was consulted for admission.   Assessment and Plan: # AKI on CKD 3B # AGMA # Traumatic rhabdomyolysis - Creatinine elevated to 3.03, from baseline of 1.7-2.0, bicarb 17, AG 16 - CK minimally elevated to >2000 - AKI likely secondary to rhabdomyolysis and dehydration - Pt had been continued on IVF - Renal function improving, recheck in AM - Avoid nephrotoxic meds   # Fall - Presented after unwitnessed fall at home - Trauma scan with no acute abnormality - Continues to have mild neck, back and lower extremity soreness - As  needed Tylenol  and IV morphine  for pain - PT/OT recs for SNF noted, TOC following - Fall precautions   # HTN - BP elevated with SBP in the 150s to 180s likely secondary to acute pain - Continue amlodipine  and Toprol  XL -Cont PRN hydralazine  IV   # Hyperkalemia - K+ elevated to 5.4 on admission secondary to rhabdomyolysis, AKI and home potassium supplementation -Normalized with hydration -recheck in AM   #Hx of DVT - Continue Xarelto    # CAD # HLD - Continue aspirin  and atorvastatin    # Lymphedema - Hold Lasix  in the setting of AKI   # Urinary incontinence -Continue tolterodine  (fesoterodine  as substitute)   # GERD - Continue Protonix        Subjective: Feeling better today. Complaining of back pain  Physical Exam: Vitals:   09/10/23 0400 09/10/23 0438 09/10/23 0500 09/10/23 1159  BP:  (!) 142/79  (!) 160/98  Pulse:  (!) 59  70  Resp: 18 18 18    Temp:  98.2 F (36.8 C)  97.9 F (36.6 C)  TempSrc:  Axillary  Oral  SpO2:  99%  100%  Weight:      Height:       General exam: Conversant, in no acute distress Respiratory system: normal chest rise, clear, no audible wheezing Cardiovascular system: regular rhythm, s1-s2 Gastrointestinal system: Nondistended, nontender, pos BS Central nervous system: No seizures, no tremors Extremities: No cyanosis, no joint deformities Skin: No rashes, no  pallor Psychiatry: Affect normal // no auditory hallucinations   Data Reviewed:  Labs reviewed: Na 136, k 4.0, Cr 1.80, WBC 5.9, Hgb 9.6, Plts 137  Family Communication: Pt in room, family not at bedside  Disposition: Status is: Inpatient Continue inpatient stay because: severity of illness  Planned Discharge Destination: Skilled nursing facility    Author: Garnette Pelt, MD 09/10/2023 6:46 PM  For on call review [email or web address].

## 2023-09-10 NOTE — Progress Notes (Signed)
 Physical Therapy Treatment Patient Details Name: Vanessa Palmer MRN: [id number] DOB: Apr 25, 1950 Today's Date: 09/10/2023   History of Present Illness Vanessa Palmer is a 73 y.o. female with medical history significant for HTN, CAD, CKD 3B, DVT on Xarelto , GERD, HLD, asthma, lymphedema, urine incontinence, osteoarthritis, chronic pain, OSA and depression who presented via EMS after recent fall from her power chair, down x ~ 3 days , not heard from. so police was called for a welfare check and patient was found on the floor. She reports neck, back and leg pain but denies any chest pain, shortness of breath, fevers or chills.  States she lives alone but her granddaughter lives in the area. Ed dx AKI, traumatic rhabdomyolysis,    PT Comments  Pt agreeable to session, states that her R hip and knee are bothering her. Requires max A x2 to come to sit EOB from supine, and back to supine from sitting, trunk and LE assist. Pt attempted to move legs towards EOB ind and is unable. Once sitting, she is able to fade the level of assist and demonstrates fair sitting balance while completing BLE knee flexion and extension. Pt returns to supine due to symptoms beginning to radiate down RLE. She was educated on completing quad sets during the day to assist with sensitivity of knees and facilitate fluid exchange in BLE. Based on pt PLOF, level of support, and current functional status, pt will benefit from skilled inpatient physical therapy <3 hours per day at dc for safe progression of functional mobility.    If plan is discharge home, recommend the following: Two people to help with walking and/or transfers;Two people to help with bathing/dressing/bathroom;Assistance with cooking/housework;Assist for transportation;Help with stairs or ramp for entrance   Can travel by private vehicle     No  Equipment Recommendations  None recommended by PT    Recommendations for Other Services       Precautions /  Restrictions Precautions Precautions: Fall Recall of Precautions/Restrictions: Intact Restrictions Weight Bearing Restrictions Per Provider Order: No     Mobility  Bed Mobility Overal bed mobility: Needs Assistance Bed Mobility: Rolling, Supine to Sit, Sit to Supine Rolling: +2 for physical assistance, +2 for safety/equipment, Max assist   Supine to sit: +2 for physical assistance, +2 for safety/equipment, Total assist Sit to supine: Total assist, +2 for safety/equipment, +2 for physical assistance   General bed mobility comments: able to maintain balance sitting EOB and completes knee flexion and extension sitting EOB before noting fatigue and returns to supine. Unable to progress to standing today    Transfers                        Ambulation/Gait                   Stairs             Wheelchair Mobility     Tilt Bed    Modified Rankin (Stroke Patients Only)       Balance Overall balance assessment: Needs assistance Sitting-balance support: Feet unsupported, Single extremity supported Sitting balance-Leahy Scale: Fair                                      Musician Communication: Impaired Factors Affecting Communication: Reduced clarity of speech  Cognition Arousal: Alert Behavior During Therapy: Anxious   PT - Cognitive  impairments: No family/caregiver present to determine baseline, Difficult to assess                         Following commands: Intact      Cueing Cueing Techniques: Verbal cues, Gestural cues, Tactile cues  Exercises      General Comments        Pertinent Vitals/Pain Pain Assessment Pain Assessment: Faces Faces Pain Scale: Hurts even more Pain Location: R knee and hip Pain Descriptors / Indicators: Aching, Guarding, Discomfort, Shooting Pain Intervention(s): Limited activity within patient's tolerance, Monitored during session, Repositioned    Home Living                           Prior Function            PT Goals (current goals can now be found in the care plan section) Acute Rehab PT Goals Patient Stated Goal: agreed to attempt mobility PT Goal Formulation: With patient Time For Goal Achievement: 09/23/23 Potential to Achieve Goals: Fair Progress towards PT goals: Progressing toward goals    Frequency    Min 2X/week      PT Plan      Co-evaluation              AM-PAC PT 6 Clicks Mobility   Outcome Measure  Help needed turning from your back to your side while in a flat bed without using bedrails?: A Lot Help needed moving from lying on your back to sitting on the side of a flat bed without using bedrails?: Total Help needed moving to and from a bed to a chair (including a wheelchair)?: Total Help needed standing up from a chair using your arms (e.g., wheelchair or bedside chair)?: Total Help needed to walk in hospital room?: Total Help needed climbing 3-5 steps with a railing? : Total 6 Click Score: 7    End of Session Equipment Utilized During Treatment: Gait belt Activity Tolerance: Patient limited by pain;Patient limited by fatigue Patient left: in bed;with call bell/phone within reach Nurse Communication: Mobility status PT Visit Diagnosis: Unsteadiness on feet (R26.81);Difficulty in walking, not elsewhere classified (R26.2);History of falling (Z91.81)     Time: 8596-8573 PT Time Calculation (min) (ACUTE ONLY): 23 min  Charges:    $Therapeutic Activity: 23-37 mins PT General Charges $$ ACUTE PT VISIT: 1 Visit                     Stann, PT Acute Rehabilitation Services Office: [phone] 09/10/2023    Stann Vanessa Palmer 09/10/2023, 2:48 PM

## 2023-09-11 DIAGNOSIS — N179 Acute kidney failure, unspecified: Secondary | ICD-10-CM | POA: Diagnosis not present

## 2023-09-11 LAB — COMPREHENSIVE METABOLIC PANEL WITH GFR
ALT: 24 U/L (ref 0–44)
AST: 58 U/L — ABNORMAL HIGH (ref 15–41)
Albumin: 2.8 g/dL — ABNORMAL LOW (ref 3.5–5.0)
Alkaline Phosphatase: 78 U/L (ref 38–126)
Anion gap: 9 (ref 5–15)
BUN: 27 mg/dL — ABNORMAL HIGH (ref 8–23)
CO2: 22 mmol/L (ref 22–32)
Calcium: 8.2 mg/dL — ABNORMAL LOW (ref 8.9–10.3)
Chloride: 103 mmol/L (ref 98–111)
Creatinine, Ser: 1.45 mg/dL — ABNORMAL HIGH (ref 0.44–1.00)
GFR, Estimated: 39 mL/min — ABNORMAL LOW (ref >60)
Glucose, Bld: 97 mg/dL (ref 70–99)
Potassium: 3.8 mmol/L (ref 3.5–5.1)
Sodium: 134 mmol/L — ABNORMAL LOW (ref 135–145)
Total Bilirubin: 1.4 mg/dL — ABNORMAL HIGH (ref 0.0–1.2)
Total Protein: 6.5 g/dL (ref 6.5–8.1)

## 2023-09-11 LAB — CBC
HCT: 29.4 % — ABNORMAL LOW (ref 36.0–46.0)
Hemoglobin: 9.6 g/dL — ABNORMAL LOW (ref 12.0–15.0)
MCH: 30 pg (ref 26.0–34.0)
MCHC: 32.7 g/dL (ref 30.0–36.0)
MCV: 91.9 fL (ref 80.0–100.0)
Platelets: 135 K/uL — ABNORMAL LOW (ref 150–400)
RBC: 3.2 MIL/uL — ABNORMAL LOW (ref 3.87–5.11)
RDW: 14.4 % (ref 11.5–15.5)
WBC: 5.6 K/uL (ref 4.0–10.5)
nRBC: 0 % (ref 0.0–0.2)

## 2023-09-11 LAB — CK: Total CK: 755 U/L — ABNORMAL HIGH (ref 38–234)

## 2023-09-11 MED ORDER — SODIUM CHLORIDE 0.9 % IV SOLN: INTRAVENOUS | Status: DC

## 2023-09-11 NOTE — Progress Notes (Signed)
 PROGRESS NOTE    Vanessa Palmer  FMW:[id number] DOB: October 28, 1950 DOA: 09/08/2023 PCP: [name], DO   Brief Narrative: Vanessa Palmer is a 73 y.o. female with a history of hypertension, CAD, CKD stage IIIb, DVT, GERD, hyperlipidemia, asthma, lymphedema, urine incontinence, osteoarthritis, chronic pain, depression.  Patient presented secondary to fall and generalized pain, found to have rhabdomyolysis with associated AKI. Patient managed with IV fluids.   Assessment and Plan:  AKI on CKD stage IIIb Baseline creatinine difficult to ascertain but appears to be somewhere around 1.8-2, but as low as 1.5-1.7. Creatinine of 3.03 on admission. IV fluids started for management.  -Continue IV fluids  Traumatic rhabdomyolysis Secondary to fall and being down. CK of 2,116 on admission. Improving with IV fluids. -Continue IV fluids  Fall Unwitnessed. Unclear circumstances. Unclear if syncope involved.  Primary hypertension -Continue amlodipine  and Toprol  XL  Hyperkalemia Likely related to AKI. Resolved with hydration.  History of DVT -Continue Xarelto   Hyperbilirubinemia Mild. Improving.  CAD Hyperlipidemia -Continue aspirin  and Lipitor   Lymphedema Noted. Stable. Patient is on Lasix  as an outpatient which was held secondary to AKI.  Urinary incontinence -Continue Toviaz   GERD -Continue Protonix    DVT prophylaxis: Xarelto  Code Status:   Code Status: Limited: Do not attempt resuscitation (DNR) -DNR-LIMITED -Do Not Intubate/DNI  Family Communication: None at bedside Disposition Plan: Discharge to SNF likely in AM pending continued improvement of CK   Consultants:  None  Procedures:  None  Antimicrobials: None    Subjective: Patient reports generalized pain from recent fall, otherwise no other concerns.  Objective: BP (!) 145/92 (BP Location: Right Arm)   Pulse 79   Temp 97.6 F (36.4 C) (Oral)   Resp 20   Ht 5' 2 (1.575 m)   Wt 78 kg   SpO2 100%    BMI 31.46 kg/m   Examination:  General exam: Appears calm and comfortable Respiratory system: Clear to auscultation. Respiratory effort normal. Cardiovascular system: S1 & S2 heard, RRR. No murmurs, rubs, gallops or clicks. Gastrointestinal system: Abdomen is nondistended, soft and nontender. Normal bowel sounds heard. Central nervous system: Alert and oriented. No focal neurological deficits. Musculoskeletal: BLE edema. No calf tenderness Skin: No cyanosis. No rashes Psychiatry: Judgement and insight appear normal. Mood & affect appropriate.    Data Reviewed: I have personally reviewed following labs and imaging studies  CBC Lab Results  Component Value Date   WBC 5.6 09/11/2023   RBC 3.20 (L) 09/11/2023   HGB 9.6 (L) 09/11/2023   HCT 29.4 (L) 09/11/2023   MCV 91.9 09/11/2023   MCH 30.0 09/11/2023   PLT 135 (L) 09/11/2023   MCHC 32.7 09/11/2023   RDW 14.4 09/11/2023   LYMPHSABS 0.6 (L) 09/08/2023   MONOABS 0.5 09/08/2023   EOSABS 0.0 09/08/2023   BASOSABS 0.0 09/08/2023     Last metabolic panel Lab Results  Component Value Date   NA 134 (L) 09/11/2023   K 3.8 09/11/2023   CL 103 09/11/2023   CO2 22 09/11/2023   BUN 27 (H) 09/11/2023   CREATININE 1.45 (H) 09/11/2023   GLUCOSE 97 09/11/2023   GFRNONAA 39 (L) 09/11/2023   GFRAA 52 (L) 12/11/2019   CALCIUM  8.2 (L) 09/11/2023   PHOS 4.5 09/09/2023   PROT 6.5 09/11/2023   ALBUMIN  2.8 (L) 09/11/2023   LABGLOB 3.8 10/05/2022   AGRATIO 1.0 (L) 07/21/2021   BILITOT 1.4 (H) 09/11/2023   ALKPHOS 78 09/11/2023   AST 58 (H) 09/11/2023  ALT 24 09/11/2023   ANIONGAP 9 09/11/2023    GFR: Estimated Creatinine Clearance: 34.9 mL/min (A) (by C-G formula based on SCr of 1.45 mg/dL (H)).  No results found for this or any previous visit (from the past 240 hours).    Radiology Studies: No results found.    LOS: 2 days    Elgin Lam, MD [facility] 09/11/2023, 6:04 PM   If 7PM-7AM, please contact  night-coverage [email or web address]

## 2023-09-11 NOTE — Plan of Care (Signed)

## 2023-09-11 NOTE — Progress Notes (Addendum)
 Mobility Specialist - Progress Note   09/11/23 1037  Mobility  Activity Dangled on edge of bed  Level of Assistance Maximum assist, patient does 25-49% (+2)  Assistive Device None  Range of Motion/Exercises Active Assistive  Activity Response Tolerated well  Mobility Referral Yes  Mobility visit 1 Mobility  Mobility Specialist Start Time (ACUTE ONLY) 1037  Mobility Specialist Stop Time (ACUTE ONLY) 1048  Mobility Specialist Time Calculation (min) (ACUTE ONLY) 11 min   Received in bed and requested assistance to EOB. Pt attempted to move legs towards edge with no success. Max assist of 2 to helicopter pt on bed pad to EOB. Left there with all needs met. Alarm on.  Cyndee Ada Mobility Specialist

## 2023-09-11 NOTE — Progress Notes (Signed)
 Occupational Therapy Treatment Patient Details Name: Vanessa Palmer MRN: [id number] DOB: 1950/06/24 Today's Date: 09/11/2023   History of present illness Vanessa Palmer is a 73 y.o. female with medical history significant for HTN, CAD, CKD 3B, DVT on Xarelto , GERD, HLD, asthma, lymphedema, urine incontinence, osteoarthritis, chronic pain, OSA and depression who presented via EMS after recent fall from her power chair, down x ~ 3 days , not heard from. so police was called for a welfare check and patient was found on the floor. She reports neck, back and leg pain but denies any chest pain, shortness of breath, fevers or chills.  States she lives alone but her granddaughter lives in the area. Ed dx AKI, traumatic rhabdomyolysis,   OT comments  Pt making progress with functional goals. Pt seated EOB upon arrival. Pt participated in grooming/oral hygiene, UB ADLs and LB bathing tasks. Pt declined OOB activity. Pt displeased with current meal on tray and OT assisted pt with ordering another meal. OT will continue to follow acutely to maximize level of function and safety      If plan is discharge home, recommend the following:  A lot of help with bathing/dressing/bathroom;A lot of help with walking and/or transfers;Assistance with cooking/housework;Assist for transportation;Help with stairs or ramp for entrance;Direct supervision/assist for financial management   Equipment Recommendations  Other (comment) (defer)    Recommendations for Other Services      Precautions / Restrictions Precautions Precautions: Fall Recall of Precautions/Restrictions: Intact Restrictions Weight Bearing Restrictions Per Provider Order: No       Mobility Bed Mobility               General bed mobility comments: pt sitting EOB upon OT arrival    Transfers                   General transfer comment: pt declined     Balance Overall balance assessment: Needs assistance Sitting-balance  support: Feet unsupported, Single extremity supported Sitting balance-Leahy Scale: Fair Sitting balance - Comments: Pt participated in UB ADLs, grooming/oral hygiene, LB bathing (simulted) seated EOB                                   ADL either performed or assessed with clinical judgement   ADL Overall ADL's : Needs assistance/impaired Eating/Feeding: Set up;Supervision/ safety;Sitting   Grooming: Wash/dry hands;Wash/dry face;Oral care;Contact guard assist;Sitting   Upper Body Bathing: Moderate assistance;Sitting   Lower Body Bathing: Maximal assistance;Moderate assistance;Sitting/lateral leans   Upper Body Dressing : Moderate assistance;Sitting                     General ADL Comments: pt declined standing/SPTs.    Extremity/Trunk Assessment Upper Extremity Assessment Upper Extremity Assessment: Generalized weakness;Right hand dominant;RUE deficits/detail;LUE deficits/detail RUE Coordination: decreased fine motor;decreased gross motor LUE Coordination: decreased gross motor;decreased fine motor   Lower Extremity Assessment Lower Extremity Assessment: Defer to PT evaluation        Vision Ability to See in Adequate Light: 0 Adequate Patient Visual Report: No change from baseline     Perception     Praxis     Communication Communication Communication: Impaired Factors Affecting Communication: Reduced clarity of speech   Cognition Arousal: Alert Behavior During Therapy: [facility] for tasks assessed/performed  Following commands: Intact        Cueing   Cueing Techniques: Verbal cues, Gestural cues, Tactile cues  Exercises      Shoulder Instructions       General Comments      Pertinent Vitals/ Pain       Pain Assessment Pain Assessment: 0-10 Pain Score: 5  Pain Location: R knee and hip Pain Descriptors / Indicators: Aching, Discomfort Pain Intervention(s): Limited activity within patient's  tolerance, Monitored during session, Repositioned  Home Living                                          Prior Functioning/Environment              Frequency  Min 2X/week        Progress Toward Goals  OT Goals(current goals can now be found in the care plan section)  Progress towards OT goals: Progressing toward goals     Plan      Co-evaluation                 AM-PAC OT 6 Clicks Daily Activity     Outcome Measure   Help from another person eating meals?: A Little Help from another person taking care of personal grooming?: A Little Help from another person toileting, which includes using toliet, bedpan, or urinal?: Total Help from another person bathing (including washing, rinsing, drying)?: A Lot Help from another person to put on and taking off regular upper body clothing?: A Lot Help from another person to put on and taking off regular lower body clothing?: Total 6 Click Score: 12    End of Session    OT Visit Diagnosis: Other abnormalities of gait and mobility (R26.89);History of falling (Z91.81);Muscle weakness (generalized) (M62.81);Feeding difficulties (R63.3);Cognitive communication deficit (R41.841);Pain Pain - Right/Left: Right Pain - part of body: Hip;Knee   Activity Tolerance Patient limited by fatigue   Patient Left in bed;with call bell/phone within reach;with bed alarm set;Other (comment) (sitting EOB eating meal)   Nurse Communication Mobility status        Time: 8860-8793 OT Time Calculation (min): 27 min  Charges: OT General Charges $OT Visit: 1 Visit OT Treatments $Self Care/Home Management : 8-22 mins $Therapeutic Activity: 8-22 mins    Jacques Karna Loose 09/11/2023, 1:30 PM

## 2023-09-11 NOTE — TOC Progression Note (Signed)
 Transition of Care [facility]) - Progression Note    Patient Details  Name: Vanessa Palmer MRN: [id number] Date of Birth: 1950/11/29  Transition of [facility]) CM/SW Contact  Sheri ONEIDA Sharps, [place] Phone Number: 09/11/2023, 2:49 PM  Clinical Narrative:    Ins auth approved. Pending medical readiness.   Expected Discharge Plan: Skilled Nursing Facility Barriers to Discharge: Continued Medical Work up  Expected Discharge Plan and Services In-house Referral: NA Discharge Planning Services: NA Post Acute Care Choice: Skilled Nursing Facility Living arrangements for the past 2 months: Apartment                 DME Arranged: N/A DME Agency: NA       HH Arranged: NA HH Agency: NA         Social Determinants of Health (SDOH) Interventions SDOH Screenings   Food Insecurity: No Food Insecurity (09/09/2023)  Housing: Low Risk  (09/09/2023)  Transportation Needs: No Transportation Needs (09/09/2023)  Utilities: Not At Risk (09/09/2023)  Alcohol Screen: Low Risk  (07/27/2022)  Depression (PHQ2-9): High Risk (04/19/2023)  Financial Resource Strain: Low Risk  (07/27/2022)  Physical Activity: Inactive (07/27/2022)  Social Connections: Moderately Integrated (09/10/2023)  Stress: No Stress Concern Present (07/27/2022)  Tobacco Use: Low Risk  (09/08/2023)    Readmission Risk Interventions    09/10/2023    4:06 PM  Readmission Risk Prevention Plan  Transportation Screening Complete  PCP or Specialist Appt within 5-7 Days Complete  Home Care Screening Complete  Medication Review (RN CM) Complete

## 2023-09-12 DIAGNOSIS — N179 Acute kidney failure, unspecified: Secondary | ICD-10-CM | POA: Diagnosis not present

## 2023-09-12 LAB — CK: Total CK: 503 U/L — ABNORMAL HIGH (ref 38–234)

## 2023-09-12 MED ORDER — AMLODIPINE BESYLATE 10 MG PO TABS: 10.0000 mg | ORAL_TABLET | Freq: Every day | ORAL | Status: AC

## 2023-09-12 MED ORDER — POTASSIUM CHLORIDE CRYS ER 20 MEQ PO TBCR: 20.0000 meq | EXTENDED_RELEASE_TABLET | Freq: Every day | ORAL | Status: DC

## 2023-09-12 MED ORDER — ACETAMINOPHEN 500 MG PO TABS: ORAL_TABLET | ORAL | Status: AC

## 2023-09-12 MED ORDER — OXYCODONE-ACETAMINOPHEN 10-325 MG PO TABS: 1.0000 | ORAL_TABLET | Freq: Two times a day (BID) | ORAL | 0 refills | Status: DC | PRN

## 2023-09-12 MED ORDER — ENSURE PLUS HIGH PROTEIN PO LIQD: 237.0000 mL | Freq: Two times a day (BID) | ORAL | Status: AC

## 2023-09-12 MED ORDER — GABAPENTIN 300 MG PO CAPS: 300.0000 mg | ORAL_CAPSULE | Freq: Two times a day (BID) | ORAL | Status: DC

## 2023-09-12 MED ORDER — METOPROLOL SUCCINATE ER 100 MG PO TB24: 100.0000 mg | ORAL_TABLET | Freq: Every day | ORAL | Status: DC

## 2023-09-12 NOTE — TOC Transition Note (Signed)
 Transition of Care [facility]) - Discharge Note   Patient Details  Name: Vanessa Palmer MRN: [id number] Date of Birth: [birth year]  Transition of Care [facility]) CM/SW Contact:  Sheri ONEIDA Sharps, LCSW Phone Number: 09/12/2023, 2:07 PM   Clinical Narrative:    Pt medically ready to dc to [facility] for [employer] Call report and room number given to RN. DC packet with signed DNR and scripts left at nurses station. Pt dtr wishes to provide transportation to SNF. No further TOC needs.    Final next level of care: Skilled Nursing Facility Barriers to Discharge: Barriers Resolved   Patient Goals and CMS Choice Patient states their goals for this hospitalization and ongoing recovery are:: return home following STR CMS Medicare.gov Compare Post Acute Care list provided to:: Patient Choice offered to / list presented to : Patient [place] ownership interest in [facility].provided to:: Patient    Discharge Placement PASRR number recieved: 09/09/23            Patient chooses bed at: [facility] Patient to be transferred to facility by: [facility] Name of family member notified: Sharps [place] (Daughter)  [phone] Patient and family notified of of transfer: 09/12/23  Discharge Plan and Services Additional resources added to the After Visit Summary for   In-house Referral: NA Discharge Planning Services: NA Post Acute Care Choice: Skilled Nursing Facility          DME Arranged: N/A DME Agency: NA       HH Arranged: NA HH Agency: NA        Social Drivers of Health (SDOH) Interventions SDOH Screenings   Food Insecurity: No Food Insecurity (09/09/2023)  Housing: Low Risk  (09/09/2023)  Transportation Needs: No Transportation Needs (09/09/2023)  Utilities: Not At Risk (09/09/2023)  Alcohol Screen: Low Risk  (07/27/2022)  Depression (PHQ2-9): High Risk (04/19/2023)  Financial Resource Strain: Low Risk  (07/27/2022)  Physical Activity: Inactive (07/27/2022)  Social  Connections: Moderately Integrated (09/10/2023)  Stress: No Stress Concern Present (07/27/2022)  Tobacco Use: Low Risk  (09/08/2023)     Readmission Risk Interventions    09/10/2023    4:06 PM  Readmission Risk Prevention Plan  Transportation Screening Complete  PCP or Specialist Appt within 5-7 Days Complete  Home Care Screening Complete  Medication Review (RN CM) Complete

## 2023-09-12 NOTE — Discharge Summary (Signed)
 Physician Discharge Summary   Patient: Vanessa Palmer MRN: [id number] DOB: 1950/08/27  Admit date:     09/08/2023  Discharge date: 09/12/23  Discharge Physician: Vanessa Lam, MD   PCP: Vanessa Dayton BROCKS, DO   Recommendations at discharge:  PCP visit for hospital follow-up BMP in 3-5 days  Discharge Diagnoses: Principal Problem:   AKI (acute kidney injury) [facility]) Active Problems:   Acute renal failure superimposed on stage 3b chronic kidney disease, unspecified acute renal failure type [facility])   Traumatic rhabdomyolysis (HCC)   Hyperkalemia   Fall at home, initial encounter   Rhabdomyolysis   Pressure injury of skin  Resolved Problems:   * No resolved hospital problems. *  Hospital Course: Vanessa Palmer is a 73 y.o. female with a history of hypertension, CAD, CKD stage IIIb, DVT, GERD, hyperlipidemia, asthma, lymphedema, urine incontinence, osteoarthritis, chronic pain, depression.  Patient presented secondary to fall and generalized pain, found to have rhabdomyolysis with associated AKI. Patient managed with IV fluids.  Assessment and Plan:  AKI on CKD stage IIIb Baseline creatinine difficult to ascertain but appears to be somewhere around 1.8-2, but as low as 1.5-1.7. Creatinine of 3.03 on admission. IV fluids started for management. Creatinine improved to 1.45 prior to discharge. Recommend to recheck BMP in 3-5 days to ensure stability.   Traumatic rhabdomyolysis Secondary to fall and being down. CK of 2,116 on admission. Improved with IV fluids. CK of 503 on day of discharge.   Fall Unwitnessed. Unclear circumstances. Patient evaluated and worked with PT with recommendation for discharge to SNF.   Primary hypertension Patient is on amlodipine , Toprol  XL, telmisartan , hydralazine  and torsemide  as an outpatient. Continue telmisartan , amlodipine  and Toprol  XL on discharge. Continue torsemide . Hold hydralazine  unless blood pressure remains uncontrolled.    Hyperkalemia Likely related to AKI from dehydration and complicated by potassium supplementation and ARB use. Resolved with hydration.   History of DVT Continue Xarelto    Hyperbilirubinemia Mild. Improving.   CAD Hyperlipidemia Continue aspirin  and Lipitor    Lymphedema Noted. Stable. Patient is on torsemide  as an outpatient which was held secondary to AKI. Resume torsemide  and potassium on discharge.   Urinary incontinence Continue Toviaz    GERD Continue Protonix .   Consultants: None Procedures performed: None  Disposition: Skilled nursing facility Diet recommendation: Cardiac diet   DISCHARGE MEDICATION: Allergies as of 09/12/2023       Reactions   Ace Inhibitors Swelling, Other (See Comments)    Angioedema   Other Anaphylaxis, Itching, Swelling   Dragonfruit   Regadenoson  Hives   LEXISCAN    Shellfish Allergy Anaphylaxis   Peanut Oil Itching, Rash, Other (See Comments)   Welts, also   Hydrocodone -acetaminophen  Itching   Hydromorphone  Hcl Rash, Other (See Comments)   Diffuse rash   Oxycodone -acetaminophen  Itching   Tolerates with Benadryl    Propoxyphene N-acetaminophen  Itching        Medication List     PAUSE taking these medications    hydrALAZINE  50 MG tablet Wait to take this until your doctor or other care provider tells you to start again. Commonly known as: APRESOLINE        STOP taking these medications    sucralfate  1 g tablet Commonly known as: Carafate        TAKE these medications    acetaminophen  500 MG tablet Commonly known as: TYLENOL  Take 2 tablets (1,000 mg total) by mouth 3 (three) times daily for 5 days, THEN 1 tablet (500 mg total) every 6 (six) hours as needed. Start  taking on: September 12, 2023   amLODipine  10 MG tablet Commonly known as: NORVASC  Take 1 tablet (10 mg total) by mouth daily. What changed:  medication strength how much to take   aspirin  EC 81 MG tablet Take 1 tablet (81 mg total) by mouth daily.  Swallow whole.   atorvastatin  40 MG tablet Commonly known as: LIPITOR  Take 1 tablet (40 mg total) by mouth daily.   budesonide -formoterol  160-4.5 MCG/ACT inhaler Commonly known as: Symbicort  Inhale 2 puffs into the lungs 3 times/day as needed-between meals & bedtime.   cyclobenzaprine  10 MG tablet Commonly known as: FLEXERIL  Take 1 tablet (10 mg total) by mouth 2 (two) times daily as needed for muscle spasms.   EPINEPHrine  0.3 mg/0.3 mL Soaj injection Commonly known as: EPI-PEN Inject 0.3 mg into the muscle as needed for anaphylaxis.   feeding supplement Liqd Take 237 mLs by mouth 2 (two) times daily between meals.   gabapentin  300 MG capsule Commonly known as: NEURONTIN  Take 1 capsule (300 mg total) by mouth 2 (two) times daily. What changed: when to take this   hydrOXYzine  25 MG tablet Commonly known as: ATARAX  Take 25 mg by mouth every 6 (six) hours as needed for itching.   metoprolol  succinate 100 MG 24 hr tablet Commonly known as: TOPROL -XL Take 1 tablet (100 mg total) by mouth daily. Take with or immediately following a meal. What changed:  medication strength how much to take   nitroGLYCERIN  0.4 MG SL tablet Commonly known as: NITROSTAT  Place 1 tablet (0.4 mg total) under the tongue every 5 (five) minutes as needed for chest pain.   oxyCODONE -acetaminophen  10-325 MG tablet Commonly known as: Percocet Take 1 tablet by mouth 2 (two) times daily as needed for pain. Take by mouth in the morning and afternoon (separated by at least 8 hours). Do not use at night.   pantoprazole  40 MG tablet Commonly known as: Protonix  Take 1 tablet (40 mg total) by mouth daily.   potassium chloride  SA 20 MEQ tablet Commonly known as: KLOR-CON  M Take 1 tablet (20 mEq total) by mouth daily. Use only when taking torsemide . What changed: additional instructions   rivaroxaban  20 MG Tabs tablet Commonly known as: Xarelto  TAKE ONE TABLET BY MOUTH DAILY WITH SUPPER   sertraline  100  MG tablet Commonly known as: Zoloft  Take 1 tablet (100 mg total) by mouth daily.   telmisartan  20 MG tablet Commonly known as: MICARDIS  Take 1 tablet (20 mg total) by mouth daily.   tolterodine  2 MG 24 hr capsule Commonly known as: DETROL  LA Take 1 capsule (2 mg total) by mouth daily.   torsemide  20 MG tablet Commonly known as: DEMADEX  Take 1 tablet (20 mg total) by mouth daily.   Zepbound  2.5 MG/0.5ML Pen Generic drug: tirzepatide  Inject 2.5 mg into the skin once a week for 28 days.        Contact information for follow-up providers     Vanessa Dayton BROCKS, DO. Schedule an appointment as soon as possible for a visit in 1 week(s).   Specialty: Internal Medicine Why: For hospital follow-up Contact information: [address] [place] [address] [phone]              Contact information for after-discharge care     Destination     [facility] .   Service: Skilled Nursing Contact information: [address] [place]  [address] [phone]  Discharge Exam: BP (!) 153/96 (BP Location: Right Arm)   Pulse 73   Temp 98.1 F (36.7 C)   Resp 20   Ht 5' 2 (1.575 m)   Wt 78 kg   SpO2 96%   BMI 31.46 kg/m   General exam: Appears calm and comfortable Respiratory system: Clear to auscultation. Respiratory effort normal. Cardiovascular system: S1 & S2 heard, RRR. Gastrointestinal system: Abdomen is nondistended, soft and nontender. Normal bowel sounds heard. Central nervous system: Alert and oriented. No focal neurological deficits. Musculoskeletal: BLE edema. No calf tenderness Psychiatry: Judgement and insight appear normal. Mood & affect appropriate.   Condition at discharge: stable  The results of significant diagnostics from this hospitalization (including imaging, microbiology, ancillary and laboratory) are listed below for reference.   Imaging Studies: DG Foot 2 Views Right Result Date:  09/09/2023 CLINICAL DATA:  Fall.  Pain. EXAM: RIGHT FOOT - 2 VIEW; RIGHT ANKLE - 2 VIEW COMPARISON:  None Available. FINDINGS: Right ankle: AP and lateral views. Limitations secondary to immobility. Cross-table lateral technique. Diffuse soft tissue swelling could be partially secondary to patient body habitus. No acute fracture or dislocation. Small Achilles and calcaneal spurs. Probable midfoot degenerative change. Tibiotalar joint space narrowing on the lateral view is mild to moderate. Right foot: AP and lateral views. Both images are degraded by patient positioning. No gross fracture or dislocation. Mild hallux valgus deformity with degenerative changes of the first metatarsophalangeal joint. IMPRESSION: Both foot and ankle radiographs are limited by two view technique and suboptimal patient positioning. Given these limitations, no acute finding identified. If patient's symptoms warrant, recommend dedicated repeat radiographs when she is able to be placed in standard radiographic positioning. Electronically Signed   By: Rockey Kilts M.D.   On: 09/09/2023 10:57   DG Ankle 2 Views Right Result Date: 09/09/2023 CLINICAL DATA:  Fall.  Pain. EXAM: RIGHT FOOT - 2 VIEW; RIGHT ANKLE - 2 VIEW COMPARISON:  None Available. FINDINGS: Right ankle: AP and lateral views. Limitations secondary to immobility. Cross-table lateral technique. Diffuse soft tissue swelling could be partially secondary to patient body habitus. No acute fracture or dislocation. Small Achilles and calcaneal spurs. Probable midfoot degenerative change. Tibiotalar joint space narrowing on the lateral view is mild to moderate. Right foot: AP and lateral views. Both images are degraded by patient positioning. No gross fracture or dislocation. Mild hallux valgus deformity with degenerative changes of the first metatarsophalangeal joint. IMPRESSION: Both foot and ankle radiographs are limited by two view technique and suboptimal patient positioning. Given  these limitations, no acute finding identified. If patient's symptoms warrant, recommend dedicated repeat radiographs when she is able to be placed in standard radiographic positioning. Electronically Signed   By: Rockey Kilts M.D.   On: 09/09/2023 10:57   DG Ankle 2 Views Left Result Date: 09/09/2023 CLINICAL DATA:  Status post fall. EXAM: LEFT ANKLE - 2 VIEW COMPARISON:  Left foot radiographs 07/25/2010. FINDINGS: The mineralization and alignment are normal. There is no evidence of acute fracture or dislocation. The ankle joint spaces appear maintained. Small bidirectional calcaneal spurs. Diffuse prominence of the soft tissues without evidence of foreign body or soft tissue emphysema. IMPRESSION: No evidence of acute fracture or dislocation. Diffuse soft tissue prominence. Electronically Signed   By: Elsie Perone M.D.   On: 09/09/2023 10:57   DG Foot 2 Views Left Result Date: 09/09/2023 CLINICAL DATA:  Fall. EXAM: LEFT FOOT - 2 VIEW COMPARISON:  None Available. FINDINGS: There is no evidence of fracture  or dislocation. There is no evidence of arthropathy or other focal bone abnormality. Soft tissues are unremarkable. IMPRESSION: Negative. Electronically Signed   By: Lynwood Landy Raddle M.D.   On: 09/09/2023 10:54   CT T-SPINE NO CHARGE Result Date: 09/08/2023 CLINICAL DATA:  Trauma EXAM: CT THORACIC SPINE WITHOUT CONTRAST TECHNIQUE: Multidetector CT images of the thoracic were obtained using the standard protocol without intravenous contrast. RADIATION DOSE REDUCTION: This exam was performed according to the departmental dose-optimization program which includes automated exposure control, adjustment of the mA and/or kV according to patient size and/or use of iterative reconstruction technique. COMPARISON:  Chest CT 02/09/2008. FINDINGS: Alignment: Normal. Vertebrae: No acute fracture or focal pathologic process. Paraspinal and other soft tissues: Negative. Disc levels: There is mild disc space narrowing  throughout the lumbar spine with endplate osteophytes compatible with degenerative change. There is no central canal or neural foraminal stenosis identified. IMPRESSION: 1. No acute fracture or traumatic subluxation of the thoracic spine. 2. Mild degenerative changes. Electronically Signed   By: Greig Pique M.D.   On: 09/08/2023 23:31   CT CHEST ABDOMEN PELVIS WO CONTRAST Result Date: 09/08/2023 CLINICAL DATA:  Recent fall with chest and abdominal pain, initial encounter EXAM: CT CHEST, ABDOMEN AND PELVIS WITHOUT CONTRAST TECHNIQUE: Multidetector CT imaging of the chest, abdomen and pelvis was performed following the standard protocol without IV contrast. RADIATION DOSE REDUCTION: This exam was performed according to the departmental dose-optimization program which includes automated exposure control, adjustment of the mA and/or kV according to patient size and/or use of iterative reconstruction technique. COMPARISON:  11/17/2022 FINDINGS: CT CHEST FINDINGS Cardiovascular: Somewhat limited due to lack of IV contrast. Mild atherosclerotic calcifications are seen. Mild dilatation of the ascending aorta to 4.2 cm is noted. Normal tapering in the thoracic aortic arch is seen. Descending aorta is unremarkable. No hyperdense crescent to suggest acute aortic abnormality is seen. Pulmonary artery is within normal limits as visualized. No cardiac enlargement is seen. Mediastinum/Nodes: Thoracic inlet is within normal limits. No hilar or mediastinal adenopathy is noted. The esophagus as visualized is within normal limits. Lungs/Pleura: Lungs are well aerated bilaterally. No focal infiltrate or sizable effusion is noted. No parenchymal nodules are seen. Musculoskeletal: Degenerative changes of the thoracic spine are seen. No acute rib abnormality is noted. CT ABDOMEN PELVIS FINDINGS Hepatobiliary: No focal liver abnormality is seen. No gallstones, gallbladder wall thickening, or biliary dilatation. Pancreas: Unremarkable.  No pancreatic ductal dilatation or surrounding inflammatory changes. Spleen: Normal in size without focal abnormality. Adrenals/Urinary Tract: Adrenal glands are within normal limits. Kidneys show a normal appearance without renal calculi or obstructive change. The ureters are within normal limits. The bladder is well distended. Stomach/Bowel: Scattered diverticular change of the colon is seen. No obstructive or inflammatory changes of the colon are noted. The appendix is within normal limits. Small bowel and stomach are within normal limits. Vascular/Lymphatic: Aortic atherosclerosis. No enlarged abdominal or pelvic lymph nodes. Reproductive: Status post hysterectomy. No adnexal masses. Other: No free fluid or focal herniation is noted. Musculoskeletal: Degenerative changes of lumbar spine are seen. No acute abnormality is noted. Stable L4 hemangioma is noted. Right hip replacement is noted. IMPRESSION: CT of the chest: Mild dilatation of the ascending aorta to 4.2 cm. Recommend annual imaging followup by CTA or MRA. This recommendation follows 2010 ACCF/AHA/AATS/ACR/ASA/SCA/SCAI/SIR/STS/SVM Guidelines for the Diagnosis and Management of Patients with Thoracic Aortic Disease. Circulation. 2010; 121: Z733-z630. Aortic aneurysm NOS (ICD10-I71.9) No posttraumatic abnormality is seen. CT of the abdomen and pelvis:  Diverticulosis without diverticulitis. No posttraumatic abnormality is seen. Electronically Signed   By: Oneil Devonshire M.D.   On: 09/08/2023 23:31   CT L-SPINE NO CHARGE Result Date: 09/08/2023 CLINICAL DATA:  Blunt polytrauma EXAM: CT LUMBAR SPINE WITHOUT CONTRAST TECHNIQUE: Multidetector CT imaging of the lumbar spine was performed without intravenous contrast administration. Multiplanar CT image reconstructions were also generated. RADIATION DOSE REDUCTION: This exam was performed according to the departmental dose-optimization program which includes automated exposure control, adjustment of the mA  and/or kV according to patient size and/or use of iterative reconstruction technique. COMPARISON:  Lumbar spine radiographs 11/17/2022 and CT 11/17/2022 FINDINGS: Segmentation: 5 lumbar type vertebrae. Alignment: No evidence of traumatic listhesis. Vertebrae: No acute fracture. Demineralization. Similar prominent trabecula and sclerosis in the L4 vertebral body suggesting hemangioma. Paraspinal and other soft tissues: Reported separately. Disc levels: Disc space height loss and degenerative endplate changes are greatest at L4-L5 where there is vacuum phenomenon. Moderate multilevel facet arthropathy. There is moderate to severe spinal canal narrowing at L4-L5. Neural foraminal narrowing is greatest on the right at L4-L5 where it is moderate. IMPRESSION: No acute fracture in the lumbar spine. Electronically Signed   By: Norman Gatlin M.D.   On: 09/08/2023 23:25   CT HEAD WO CONTRAST ( ) Result Date: 09/08/2023 CLINICAL DATA:  Head trauma, intracranial venous injury suspected; Neck trauma (Age >= 65y) EXAM: CT HEAD WITHOUT CONTRAST CT CERVICAL SPINE WITHOUT CONTRAST TECHNIQUE: Multidetector CT imaging of the head and cervical spine was performed following the standard protocol without intravenous contrast. Multiplanar CT image reconstructions of the cervical spine were also generated. RADIATION DOSE REDUCTION: This exam was performed according to the departmental dose-optimization program which includes automated exposure control, adjustment of the mA and/or kV according to patient size and/or use of iterative reconstruction technique. COMPARISON:  None Available. FINDINGS: CT HEAD FINDINGS Brain: No evidence of acute infarction, hemorrhage, hydrocephalus, extra-axial collection or mass lesion/mass effect. Vascular: Calcific atherosclerosis. No hyperdense vessel identified. Skull: No acute fracture. Sinuses/Orbits: Clear sinuses.  No acute orbital findings. Other: No mastoid effusions. CT CERVICAL SPINE  FINDINGS Alignment: No substantial sagittal subluxation. Skull base and vertebrae: Vertebral body heights are maintained. No evidence of acute fracture. Soft tissues and spinal canal: No prevertebral fluid or swelling. No visible canal hematoma. Disc levels: Moderate to severe multilevel degenerative change including degenerative disc disease, endplate spurring and facet and uncovertebral hypertrophy with varying degrees of neural foraminal stenosis. Upper chest: Visualized lung apices are clear. Other: None. IMPRESSION: 1. No evidence of acute intracranial abnormality. 2. No evidence of acute fracture or traumatic malalignment in the cervical spine. Electronically Signed   By: Gilmore GORMAN Molt M.D.   On: 09/08/2023 23:25   CT Cervical Spine Wo Contrast Result Date: 09/08/2023 CLINICAL DATA:  Head trauma, intracranial venous injury suspected; Neck trauma (Age >= 65y) EXAM: CT HEAD WITHOUT CONTRAST CT CERVICAL SPINE WITHOUT CONTRAST TECHNIQUE: Multidetector CT imaging of the head and cervical spine was performed following the standard protocol without intravenous contrast. Multiplanar CT image reconstructions of the cervical spine were also generated. RADIATION DOSE REDUCTION: This exam was performed according to the departmental dose-optimization program which includes automated exposure control, adjustment of the mA and/or kV according to patient size and/or use of iterative reconstruction technique. COMPARISON:  None Available. FINDINGS: CT HEAD FINDINGS Brain: No evidence of acute infarction, hemorrhage, hydrocephalus, extra-axial collection or mass lesion/mass effect. Vascular: Calcific atherosclerosis. No hyperdense vessel identified. Skull: No acute fracture. Sinuses/Orbits: Clear sinuses.  No acute  orbital findings. Other: No mastoid effusions. CT CERVICAL SPINE FINDINGS Alignment: No substantial sagittal subluxation. Skull base and vertebrae: Vertebral body heights are maintained. No evidence of acute  fracture. Soft tissues and spinal canal: No prevertebral fluid or swelling. No visible canal hematoma. Disc levels: Moderate to severe multilevel degenerative change including degenerative disc disease, endplate spurring and facet and uncovertebral hypertrophy with varying degrees of neural foraminal stenosis. Upper chest: Visualized lung apices are clear. Other: None. IMPRESSION: 1. No evidence of acute intracranial abnormality. 2. No evidence of acute fracture or traumatic malalignment in the cervical spine. Electronically Signed   By: Gilmore GORMAN Molt M.D.   On: 09/08/2023 23:25    Microbiology: Results for orders placed or performed during the hospital encounter of 11/17/22  Urine Culture     Status: Abnormal   Collection Time: 11/17/22  3:39 AM   Specimen: Urine, Random  Result Value Ref Range Status   Specimen Description URINE, RANDOM  Final   Special Requests   Final    NONE Reflexed from [id number] Performed at [facility], [address], [place], [place] [address]    Culture >=100,000 COLONIES/mL ESCHERICHIA COLI (A)  Final   Report Status 11/19/2022 FINAL  Final   Organism ID, Bacteria ESCHERICHIA COLI (A)  Final      Susceptibility   Escherichia coli - MIC*    AMPICILLIN 8 SENSITIVE Sensitive     CEFAZOLIN  <=4 SENSITIVE Sensitive     CEFEPIME <=0.12 SENSITIVE Sensitive     CEFTRIAXONE  <=0.25 SENSITIVE Sensitive     CIPROFLOXACIN  >=4 RESISTANT Resistant     GENTAMICIN <=1 SENSITIVE Sensitive     IMIPENEM <=0.25 SENSITIVE Sensitive     NITROFURANTOIN <=16 SENSITIVE Sensitive     TRIMETH/SULFA <=20 SENSITIVE Sensitive     AMPICILLIN/SULBACTAM 4 SENSITIVE Sensitive     PIP/TAZO <=4 SENSITIVE Sensitive     * >=100,000 COLONIES/mL ESCHERICHIA COLI   *Note: Due to a large number of results and/or encounters for the requested time period, some results have not been displayed. A complete set of results can be found in Results Review.    Labs: CBC: Recent Labs  Lab  09/08/23 2109 09/09/23 0536 09/10/23 0527 09/11/23 0532  WBC 9.0 7.9 5.9 5.6  NEUTROABS 7.8*  --   --   --   HGB 10.7* 10.0* 9.6* 9.6*  HCT 33.4* 31.3* 30.2* 29.4*  MCV 92.3 92.6 92.4 91.9  PLT 174 146* 137* 135*   Basic Metabolic Panel: Recent Labs  Lab 09/08/23 2109 09/09/23 0536 09/10/23 0527 09/11/23 0532  NA 137 137 136 134*  K 5.4* 4.8 4.0 3.8  CL 104 106 109 103  CO2 17* 18* 21* 22  GLUCOSE 101* 95 86 97  BUN 51* 45* 35* 27*  CREATININE 3.03* 2.41* 1.80* 1.45*  CALCIUM  9.2 8.6* 8.3* 8.2*  MG  --  1.8  --   --   PHOS  --  4.5  --   --    Liver Function Tests: Recent Labs  Lab 09/08/23 2109 09/09/23 0536 09/10/23 0527 09/11/23 0532  AST 76* 77* 73* 58*  ALT 27 25 26 24   ALKPHOS 108 95 82 78  BILITOT 2.4* 1.8* 1.7* 1.4*  PROT 8.6* 7.3 6.6 6.5  ALBUMIN  3.9 3.3* 2.9* 2.8*    Discharge time spent: 35 minutes.  Signed: Elgin Lam, MD [facility] 09/12/2023

## 2023-09-12 NOTE — Discharge Instructions (Signed)
 Vanessa Palmer,  You were here after a fall. You were found to have some muscle damage and kidney impairment. You have improved with IV fluids. Your blood pressure medication is being adjusted; please follow-up with your primary care physician.

## 2023-09-12 NOTE — Plan of Care (Signed)

## 2023-09-12 NOTE — Plan of Care (Signed)
  Problem: Education: Goal: Knowledge of General Education information will improve Description: Including pain rating scale, medication(s)/side effects and non-pharmacologic comfort measures Outcome: Progressing   Problem: Clinical Measurements: Goal: Ability to maintain clinical measurements within normal limits will improve Outcome: Progressing   Problem: Activity: Goal: Risk for activity intolerance will decrease Outcome: Progressing   Problem: Elimination: Goal: Will not experience complications related to bowel motility Outcome: Progressing   Problem: Pain Managment: Goal: General experience of comfort will improve and/or be controlled Outcome: Progressing

## 2023-09-18 ENCOUNTER — Other Ambulatory Visit: Payer: Self-pay

## 2023-09-19 NOTE — Patient Outreach (Signed)
 Complex Care Management   Visit Note  09/19/2023  Name:  Vanessa Palmer MRN: [id number] DOB: [birth year]  Situation: Referral received for Complex Care Management related to HTN I obtained verbal consent from Patient.  Visit completed with patient  on the phone  Background:   Past Medical History:  Diagnosis Date   ALCOHOL ABUSE 12/13/2005   Annotation: Sober since 11/06 Qualifier: Diagnosis of  By: Elnor MD, Comer     Anemia    Arthritis    all over (02/21/2017)   CAD (coronary artery disease)    s/p non-Q wave MI in 06/2001 and 2004, 2005   CHF (congestive heart failure) (HCC)    Chronic kidney disease (CKD), stage III (moderate) (HCC) 09/19/2010   Chronic mid back pain    Depression    DJD (degenerative joint disease)    DVT (deep venous thrombosis) (HCC)    BLE   DVT of lower extremity, bilateral (HCC) 04/04/2012   On Xarelto      GERD (gastroesophageal reflux disease)    Gout    Hyperlipidemia    Hypertension    INTRINSIC ASTHMA, WITH EXACERBATION 09/27/2009   Qualifier: Diagnosis of  By: Danella MD, Nodira     Migraine    none anymore (02/21/2017)   Myocardial infarction [facility]) ?2005   Obesity    OSA (obstructive sleep apnea)    previously used CPAP, has lost it (02/21/2017)   Seizures (HCC)    As a teenager     Assessment: I spoke with Vanessa Palmer today, and she informed me that she is currently in rehab at [facility]. She is recovering from a fall nd admission to the hospital on 09/08/23 and is undergoing rehabilitation for weakness in her legs. She mentioned that her blood pressure is fluctuating; today it was 154/95. I also informed her that I will no longer be her case manager and that Vanessa Palmer will be contacting her moving forward. Patient Reported Symptoms:  Cognitive Cognitive Status: Able to follow simple commands, Alert and oriented to person, place, and time, Normal speech and language skills      Neurological Neurological Review of  Symptoms: No symptoms reported    HEENT HEENT Symptoms Reported: No symptoms reported      Cardiovascular Cardiovascular Symptoms Reported: No symptoms reported    Respiratory Respiratory Symptoms Reported: No symptoms reported    Endocrine Endocrine Symptoms Reported: No symptoms reported    Gastrointestinal Gastrointestinal Symptoms Reported: No symptoms reported      Genitourinary Genitourinary Symptoms Reported: No symptoms reported    Integumentary Integumentary Symptoms Reported: Other Other Integumentary Symptoms: Sore buttocks Skin Management Strategies: Medication therapy, Dressing changes  Musculoskeletal Musculoskelatal Symptoms Reviewed: Unsteady gait, Back pain, Weakness Musculoskeletal Management Strategies: Medical device Falls in the past year?: Yes Number of falls in past year: 1 or less Was there an injury with Fall?: Yes Fall Risk Category Calculator: 2 Patient Fall Risk Level: Moderate Fall Risk Patient at Risk for Falls Due to: History of fall(s), Other (Comment) (She said that she does not know how hse fell) Fall risk Follow up: Falls evaluation completed, Education provided, Falls prevention discussed  Psychosocial       Quality of Family Relationships: supportive, involved Do you feel physically threatened by others?: No      09/18/2023    3:49 PM  Depression screen PHQ 2/9  Decreased Interest 2  Down, Depressed, Hopeless 2  PHQ - 2 Score 4  Altered sleeping 3  Tired, decreased  energy 2  Change in appetite 3  Feeling bad or failure about yourself  0  Trouble concentrating 1  Moving slowly or fidgety/restless 1  Suicidal thoughts 0  PHQ-9 Score 14    There were no vitals filed for this visit.  Medications Reviewed Today     Reviewed by Weyman Corning, RN (Registered Nurse) on 09/18/23 at 1540  Med List Status: <None>   Medication Order Taking? Sig Documenting Provider Last Dose Status Informant  acetaminophen  (TYLENOL ) 500 MG tablet  [id number] Yes Take 2 tablets (1,000 mg total) by mouth 3 (three) times daily for 5 days, THEN 1 tablet (500 mg total) every 6 (six) hours as needed. Briana Elgin LABOR, MD  Active   amLODipine  (NORVASC ) 10 MG tablet [id number] Yes Take 1 tablet (10 mg total) by mouth daily. Briana Elgin LABOR, MD  Active   aspirin  EC 81 MG tablet [id number] Yes Take 1 tablet (81 mg total) by mouth daily. Swallow whole. Addie Perkins, DO  Active Pharmacy Records  atorvastatin  (LIPITOR ) 40 MG tablet [id number] Yes Take 1 tablet (40 mg total) by mouth daily. Addie Perkins, DO  Active Pharmacy Records           Med Note STEFFI, ADELITA   Tue Sep 10, 2023 12:36 PM)  Recent Dispenses     08/18/2023 40 MG TABS (disp 90, 90d supply) 06/11/2023 40 MG TABS (disp 90, 90d supply)     budesonide -formoterol  (SYMBICORT ) 160-4.5 MCG/ACT inhaler [id number] Yes Inhale 2 puffs into the lungs 3 times/day as needed-between meals & bedtime. Leopold Perkins NOVAK, MD  Active Pharmacy Records           Med Note STEFFI, ALEXANDRIA   Tue Sep 10, 2023 12:37 PM) No recent fills  cyclobenzaprine  (FLEXERIL ) 10 MG tablet [id number] Yes Take 1 tablet (10 mg total) by mouth 2 (two) times daily as needed for muscle spasms. Vernetta Lonni GRADE, MD  Active Pharmacy Records           Med Note STEFFI, ALEXANDRIA   Tue Sep 10, 2023 12:37 PM) Recent Dispenses    08/09/2023 10 MG TABS (disp 30, 15d supply) 05/30/2023 10 MG TABS (disp 30, 15d supply)    EPINEPHrine  0.3 mg/0.3 mL IJ SOAJ injection [id number] Yes Inject 0.3 mg into the muscle as needed for anaphylaxis. Leopold Perkins NOVAK, MD  Active Pharmacy Records           Med Note [facility], [facility]   Mon Sep 09, 2023  9:30 PM)    feeding supplement (ENSURE PLUS HIGH PROTEIN) LIQD [id number] Yes Take 237 mLs by mouth 2 (two) times daily between meals. Briana Elgin LABOR, MD  Active   gabapentin  (NEURONTIN ) 300 MG capsule [id number] Yes Take 1 capsule (300 mg total) by mouth 2 (two) times daily. Briana Elgin LABOR,  MD  Active   hydrALAZINE  (APRESOLINE ) 50 MG tablet [id number]   [provider]  Active Pharmacy Records           Med Note STEFFI ADELITA   Tue Sep 10, 2023 12:37 PM) Recent Dispenses    08/26/2023 50 MG TABS (disp 180, 45d supply) 07/04/2023 50 MG TABS (disp 180, 45d supply)    hydrOXYzine  (ATARAX ) 25 MG tablet [id number] Yes Take 25 mg by mouth every 6 (six) hours as needed for itching. [provider]  Active Pharmacy Records           Med Note STEFFI, ALEXANDRIA   Tue Sep 10, 2023 12:38 PM) No  recent fills   metoprolol  succinate (TOPROL -XL) 100 MG 24 hr tablet [id number] Yes Take 1 tablet (100 mg total) by mouth daily. Take with or immediately following a meal. Briana Elgin LABOR, MD  Active   nitroGLYCERIN  (NITROSTAT ) 0.4 MG SL tablet [id number] Yes Place 1 tablet (0.4 mg total) under the tongue every 5 (five) minutes as needed for chest pain. [name], DO  Active Pharmacy Records  oxyCODONE -acetaminophen  [facility]) 10-325 MG tablet [id number] Yes Take 1 tablet by mouth 2 (two) times daily as needed for pain. Take by mouth in the morning and afternoon (separated by at least 8 hours). Do not use at night. Briana Elgin LABOR, MD  Active   pantoprazole  (PROTONIX ) 40 MG tablet [id number] Yes Take 1 tablet (40 mg total) by mouth daily. [name], DO  Active Pharmacy Records           Med Note STEFFI, ADELITA   Tue Sep 10, 2023 12:39 PM) Recent Dispenses    08/27/2023 40 MG TBEC (disp 30, 30d supply) 01/31/2023 40 MG TBEC (disp 30, 30d supply)    potassium chloride  SA (KLOR-CON  M) 20 MEQ tablet [id number] Yes Take 1 tablet (20 mEq total) by mouth daily. Use only when taking torsemide . Briana Elgin LABOR, MD  Active   rivaroxaban  (XARELTO ) 20 MG TABS tablet [id number] Yes TAKE ONE TABLET BY MOUTH DAILY WITH SUPPER Addie Perkins, DO  Active Pharmacy Records           Med Note STEFFI, ADELITA   Tue Sep 10, 2023 12:39 PM) Recent Dispenses    06/11/2023 20 MG  TABS (disp 90, 90d supply) 03/11/2023 20 MG TABS (disp 90, 90d supply)    sertraline  (ZOLOFT ) 100 MG tablet [id number] Yes Take 1 tablet (100 mg total) by mouth daily. Gregary Sharper, MD  Active Pharmacy Records           Med Note STEFFI, ADELITA   Tue Sep 10, 2023 12:39 PM) Recent Dispenses    08/18/2023 100 MG TABS (disp 90, 90d supply) 07/12/2023 100 MG TABS (disp 31, 31d supply)    telmisartan  (MICARDIS ) 20 MG tablet [id number] Yes Take 1 tablet (20 mg total) by mouth daily. [name], DO  Active Pharmacy Records  tolterodine  (DETROL  LA) 2 MG 24 hr capsule [id number] Yes Take 1 capsule (2 mg total) by mouth daily. Addie Perkins, DO  Active Pharmacy Records           Med Note STEFFI, ALEXANDRIA   Tue Sep 10, 2023 12:41 PM)  Recent Dispenses     07/16/2023 2 MG CP24 (disp 30, 30d supply) 06/14/2023 2 MG CP24 (disp 30, 30d supply)     torsemide  (DEMADEX ) 20 MG tablet [id number] Yes Take 1 tablet (20 mg total) by mouth daily. [name], DO  Active Pharmacy Records           Med Note STEFFI, ADELITA   Tue Sep 10, 2023 12:41 PM) Recent Dispenses    08/27/2023 20 MG TABS (disp 90, 90d supply) 06/20/2023 20 MG TABS (disp 180, 90d supply) 09/27/2022 20 MG TABS (disp 180, 90d supply)              Recommendation:   Continue Current Plan of Care  Follow Up Plan:   Telephone follow up appointment with care management team member scheduled for:  10/21/23  345 with Vanessa Finlay RNCM          Wilbert Diver RN, BSN, [facility]  [facility]  [facility], [facility]    Care Coordinator Phone: [phone]

## 2023-09-19 NOTE — Patient Outreach (Incomplete)
 Complex Care Management   Visit Note  09/19/2023  Name:  Vanessa Palmer MRN: [id number] DOB: 06-29-1950  Situation: Referral received for Complex Care Management related to {Criteria:32550} I obtained verbal consent from {CHL AMB Patient/Caregiver:28184}.  Visit completed with ***  {VISIT LOCATION:32553}  Background:   Past Medical History:  Diagnosis Date  . ALCOHOL ABUSE 12/13/2005   Annotation: Sober since 11/06 Qualifier: Diagnosis of  By: Elnor MD, Comer    . Anemia   . Arthritis    all over (02/21/2017)  . CAD (coronary artery disease)    s/p non-Q wave MI in 06/2001 and 2004, 2005  . CHF (congestive heart failure) (HCC)   . Chronic kidney disease (CKD), stage III (moderate) (HCC) 09/19/2010  . Chronic mid back pain   . Depression   . DJD (degenerative joint disease)   . DVT (deep venous thrombosis) (HCC)    BLE  . DVT of lower extremity, bilateral (HCC) 04/04/2012   On Xarelto     . GERD (gastroesophageal reflux disease)   . Gout   . Hyperlipidemia   . Hypertension   . INTRINSIC ASTHMA, WITH EXACERBATION 09/27/2009   Qualifier: Diagnosis of  By: Danella MD, Nodira    . Migraine    none anymore (02/21/2017)  . Myocardial infarction [facility]) ?2005  . Obesity   . OSA (obstructive sleep apnea)    previously used CPAP, has lost it (02/21/2017)  . Seizures (HCC)    As a teenager     Assessment: Patient Reported Symptoms:  Cognitive Cognitive Status: Able to follow simple commands, Alert and oriented to person, place, and time, Normal speech and language skills      Neurological Neurological Review of Symptoms: No symptoms reported    HEENT HEENT Symptoms Reported: No symptoms reported      Cardiovascular Cardiovascular Symptoms Reported: No symptoms reported    Respiratory Respiratory Symptoms Reported: No symptoms reported    Endocrine Endocrine Symptoms Reported: No symptoms reported    Gastrointestinal Gastrointestinal Symptoms Reported: No symptoms  reported      Genitourinary Genitourinary Symptoms Reported: No symptoms reported    Integumentary Integumentary Symptoms Reported: Other Other Integumentary Symptoms: Sore buttocks Skin Management Strategies: Medication therapy, Dressing changes  Musculoskeletal Musculoskelatal Symptoms Reviewed: Unsteady gait, Back pain, Weakness Musculoskeletal Management Strategies: Medical device Falls in the past year?: Yes Number of falls in past year: 1 or less Was there an injury with Fall?: Yes Fall Risk Category Calculator: 2 Patient Fall Risk Level: Moderate Fall Risk Patient at Risk for Falls Due to: History of fall(s), Other (Comment) (She said that she does not know how hse fell) Fall risk Follow up: Falls evaluation completed, Education provided, Falls prevention discussed  Psychosocial       Quality of Family Relationships: supportive, involved Do you feel physically threatened by others?: No      09/18/2023    3:49 PM  Depression screen PHQ 2/9  Decreased Interest 2  Down, Depressed, Hopeless 2  PHQ - 2 Score 4  Altered sleeping 3  Tired, decreased energy 2  Change in appetite 3  Feeling bad or failure about yourself  0  Trouble concentrating 1  Moving slowly or fidgety/restless 1  Suicidal thoughts 0  PHQ-9 Score 14    There were no vitals filed for this visit.  Medications Reviewed Today     Reviewed by Weyman Corning, RN (Registered Nurse) on 09/18/23 at 1540  Med List Status: <None>   Medication Order Taking? Sig  Documenting Provider Last Dose Status Informant  acetaminophen  (TYLENOL ) 500 MG tablet [id number] Yes Take 2 tablets (1,000 mg total) by mouth 3 (three) times daily for 5 days, THEN 1 tablet (500 mg total) every 6 (six) hours as needed. Briana Elgin LABOR, MD  Active   amLODipine  (NORVASC ) 10 MG tablet [id number] Yes Take 1 tablet (10 mg total) by mouth daily. Briana Elgin LABOR, MD  Active   aspirin  EC 81 MG tablet [id number] Yes Take 1 tablet (81 mg total) by  mouth daily. Swallow whole. Addie Perkins, DO  Active Pharmacy Records  atorvastatin  (LIPITOR ) 40 MG tablet [id number] Yes Take 1 tablet (40 mg total) by mouth daily. Addie Perkins, DO  Active Pharmacy Records           Med Note STEFFI, ADELITA   Tue Sep 10, 2023 12:36 PM)  Recent Dispenses     08/18/2023 40 MG TABS (disp 90, 90d supply) 06/11/2023 40 MG TABS (disp 90, 90d supply)     budesonide -formoterol  (SYMBICORT ) 160-4.5 MCG/ACT inhaler [id number] Yes Inhale 2 puffs into the lungs 3 times/day as needed-between meals & bedtime. Leopold Perkins NOVAK, MD  Active Pharmacy Records           Med Note STEFFI, ALEXANDRIA   Tue Sep 10, 2023 12:37 PM) No recent fills  cyclobenzaprine  (FLEXERIL ) 10 MG tablet [id number] Yes Take 1 tablet (10 mg total) by mouth 2 (two) times daily as needed for muscle spasms. Vernetta Lonni GRADE, MD  Active Pharmacy Records           Med Note STEFFI, ALEXANDRIA   Tue Sep 10, 2023 12:37 PM) Recent Dispenses    08/09/2023 10 MG TABS (disp 30, 15d supply) 05/30/2023 10 MG TABS (disp 30, 15d supply)    EPINEPHrine  0.3 mg/0.3 mL IJ SOAJ injection [id number] Yes Inject 0.3 mg into the muscle as needed for anaphylaxis. Leopold Perkins NOVAK, MD  Active Pharmacy Records           Med Note [facility], [facility]   Mon Sep 09, 2023  9:30 PM)    feeding supplement (ENSURE PLUS HIGH PROTEIN) LIQD [id number] Yes Take 237 mLs by mouth 2 (two) times daily between meals. Briana Elgin LABOR, MD  Active   gabapentin  (NEURONTIN ) 300 MG capsule [id number] Yes Take 1 capsule (300 mg total) by mouth 2 (two) times daily. Briana Elgin LABOR, MD  Active   hydrALAZINE  (APRESOLINE ) 50 MG tablet [id number]   [provider]  Active Pharmacy Records           Med Note STEFFI ADELITA   Tue Sep 10, 2023 12:37 PM) Recent Dispenses    08/26/2023 50 MG TABS (disp 180, 45d supply) 07/04/2023 50 MG TABS (disp 180, 45d supply)    hydrOXYzine  (ATARAX ) 25 MG tablet [id number] Yes Take 25 mg by mouth  every 6 (six) hours as needed for itching. [provider]  Active Pharmacy Records           Med Note STEFFI, ALEXANDRIA   Tue Sep 10, 2023 12:38 PM) No recent fills   metoprolol  succinate (TOPROL -XL) 100 MG 24 hr tablet [id number] Yes Take 1 tablet (100 mg total) by mouth daily. Take with or immediately following a meal. Briana Elgin LABOR, MD  Active   nitroGLYCERIN  (NITROSTAT ) 0.4 MG SL tablet [id number] Yes Place 1 tablet (0.4 mg total) under the tongue every 5 (five) minutes as needed for chest pain. [name], DO  Active Pharmacy Records  oxyCODONE -acetaminophen  [facility])  10-325 MG tablet [id number] Yes Take 1 tablet by mouth 2 (two) times daily as needed for pain. Take by mouth in the morning and afternoon (separated by at least 8 hours). Do not use at night. Briana Elgin LABOR, MD  Active   pantoprazole  (PROTONIX ) 40 MG tablet [id number] Yes Take 1 tablet (40 mg total) by mouth daily. [name], DO  Active Pharmacy Records           Med Note STEFFI, ADELITA   Tue Sep 10, 2023 12:39 PM) Recent Dispenses    08/27/2023 40 MG TBEC (disp 30, 30d supply) 01/31/2023 40 MG TBEC (disp 30, 30d supply)    potassium chloride  SA (KLOR-CON  M) 20 MEQ tablet [id number] Yes Take 1 tablet (20 mEq total) by mouth daily. Use only when taking torsemide . Briana Elgin LABOR, MD  Active   rivaroxaban  (XARELTO ) 20 MG TABS tablet [id number] Yes TAKE ONE TABLET BY MOUTH DAILY WITH SUPPER Addie Perkins, DO  Active Pharmacy Records           Med Note STEFFI, ADELITA   Tue Sep 10, 2023 12:39 PM) Recent Dispenses    06/11/2023 20 MG TABS (disp 90, 90d supply) 03/11/2023 20 MG TABS (disp 90, 90d supply)    sertraline  (ZOLOFT ) 100 MG tablet [id number] Yes Take 1 tablet (100 mg total) by mouth daily. Gregary Sharper, MD  Active Pharmacy Records           Med Note STEFFI, ADELITA   Tue Sep 10, 2023 12:39 PM) Recent Dispenses    08/18/2023 100 MG TABS (disp 90, 90d supply) 07/12/2023 100 MG  TABS (disp 31, 31d supply)    telmisartan  (MICARDIS ) 20 MG tablet [id number] Yes Take 1 tablet (20 mg total) by mouth daily. [name], DO  Active Pharmacy Records  tolterodine  (DETROL  LA) 2 MG 24 hr capsule [id number] Yes Take 1 capsule (2 mg total) by mouth daily. Addie Perkins, DO  Active Pharmacy Records           Med Note STEFFI, ALEXANDRIA   Tue Sep 10, 2023 12:41 PM)  Recent Dispenses     07/16/2023 2 MG CP24 (disp 30, 30d supply) 06/14/2023 2 MG CP24 (disp 30, 30d supply)     torsemide  (DEMADEX ) 20 MG tablet [id number] Yes Take 1 tablet (20 mg total) by mouth daily. [name], DO  Active Pharmacy Records           Med Note STEFFI, ADELITA   Tue Sep 10, 2023 12:41 PM) Recent Dispenses    08/27/2023 20 MG TABS (disp 90, 90d supply) 06/20/2023 20 MG TABS (disp 180, 90d supply) 09/27/2022 20 MG TABS (disp 180, 90d supply)              Recommendation:   Continue Current Plan of Care  Follow Up Plan:   {FOLLOWUP:32559}  SIG ***

## 2023-09-20 NOTE — Patient Instructions (Signed)
 Visit Information  Thank you for taking time to visit with me today. Please don't hesitate to contact me if I can be of assistance to you before our next scheduled appointment.  Telephone follow up appointment with care management team member scheduled for:  10/21/23  345 with Rosaline Finlay [facility]   Please call the care guide team at [phone] if you need to cancel, schedule, or reschedule an appointment.   Please call the Suicide and Crisis Lifeline: 988 call the [place]  National Suicide Prevention Lifeline: 575-435-8768 or TTY: 409-603-0165 TTY 380-104-2605) to talk to a trained counselor call 1-800-273-TALK (toll free, 24 hour hotline) call 911 if you are experiencing a Mental Health or Behavioral Health Crisis or need someone to talk to.   Wilbert Diver RN, BSN, [facility]  [facility], [facility]    Care Coordinator Phone: [phone]

## 2023-09-26 ENCOUNTER — Other Ambulatory Visit (HOSPITAL_COMMUNITY): Payer: Self-pay

## 2023-09-26 MED ORDER — OXYCODONE-ACETAMINOPHEN 10-325 MG PO TABS: 1.0000 | ORAL_TABLET | Freq: Two times a day (BID) | ORAL | 0 refills | Status: DC | PRN

## 2023-09-30 ENCOUNTER — Encounter: Payer: Self-pay | Admitting: Internal Medicine

## 2023-09-30 ENCOUNTER — Ambulatory Visit: Payer: Self-pay | Admitting: Internal Medicine

## 2023-09-30 VITALS — BP 164/102 | HR 89 | Temp 98.6°F | Ht 62.0 in | Wt 263.8 lb

## 2023-09-30 DIAGNOSIS — I890 Lymphedema, not elsewhere classified: Secondary | ICD-10-CM | POA: Diagnosis not present

## 2023-09-30 DIAGNOSIS — N179 Acute kidney failure, unspecified: Secondary | ICD-10-CM

## 2023-09-30 DIAGNOSIS — I10 Essential (primary) hypertension: Secondary | ICD-10-CM | POA: Diagnosis not present

## 2023-09-30 NOTE — Progress Notes (Signed)
 Established Patient Office Visit  Subjective   Patient ID: Vanessa Palmer, female    DOB: 15-Aug-1950  Age: 73 y.o. MRN: [id number]  Chief Complaint  Patient presents with   HFU    Had a fall. Legs/feet swollen.   Discussed the use of AI scribe software for clinical note transcription with the patient, who gave verbal consent to proceed.  History of Present Illness Vanessa Palmer is a 73 year old female who presents with nausea and stomach upset following a recent hospitalization for a fall.  She was hospitalized after a fall and received treatment with fluids and monitoring of lab work. She was discharged approximately two weeks ago and attended a rehabilitation program at [facility], which focused on strengthening exercises but was not satisfactory for her.  Since returning home, she has experienced stomach upset and nausea, leading to decreased appetite and difficulty taking her medications. She experienced vomiting yesterday and this morning. She lives alone and has not identified any external factors contributing to her symptoms.  She returned home on Saturday and has not taken her BP meds since then.  She has been unable to pick up some of her medications after being discharged from rehab, as she mentioned receiving a call about them but has not yet collected them.  She did not bring her medications to this visit.    Objective:     BP (!) 164/102 (BP Location: Left Arm, Patient Position: Sitting, Cuff Size: Large)   Pulse 89   Temp 98.6 F (37 C) (Oral)   Ht 5' 2 (1.575 m)   Wt 263 lb 12.8 oz (119.7 kg)   SpO2 98% Comment: RA  BMI 48.25 kg/m  BP Readings from Last 3 Encounters:  09/30/23 (!) 164/102  09/12/23 (!) 154/95  08/26/23 91/63   Wt Readings from Last 3 Encounters:  09/30/23 263 lb 12.8 oz (119.7 kg)  09/08/23 172 lb (78 kg)  08/26/23 272 lb 9.6 oz (123.7 kg)      Physical Exam CHEST: Clear to auscultation bilaterally, no wheezes. CARDIOVASCULAR:  Normal heart rate and rhythm, S1 and S2 normal without murmurs. LE: Brawny edema bilateral legs.     No results found for any visits on 09/30/23.  Last CBC Lab Results  Component Value Date   WBC 5.6 09/11/2023   HGB 9.6 (L) 09/11/2023   HCT 29.4 (L) 09/11/2023   MCV 91.9 09/11/2023   MCH 30.0 09/11/2023   RDW 14.4 09/11/2023   PLT 135 (L) 09/11/2023   Last metabolic panel Lab Results  Component Value Date   GLUCOSE 97 09/11/2023   NA 134 (L) 09/11/2023   K 3.8 09/11/2023   CL 103 09/11/2023   CO2 22 09/11/2023   BUN 27 (H) 09/11/2023   CREATININE 1.45 (H) 09/11/2023   GFRNONAA 39 (L) 09/11/2023   CALCIUM  8.2 (L) 09/11/2023   PHOS 4.5 09/09/2023   PROT 6.5 09/11/2023   ALBUMIN  2.8 (L) 09/11/2023   LABGLOB 3.8 10/05/2022   AGRATIO 1.0 (L) 07/21/2021   BILITOT 1.4 (H) 09/11/2023   ALKPHOS 78 09/11/2023   AST 58 (H) 09/11/2023   ALT 24 09/11/2023   ANIONGAP 9 09/11/2023      The ASCVD Risk score (Arnett DK, et al., 2019) failed to calculate for the following reasons:   Risk score cannot be calculated because patient has a medical history suggesting prior/existing ASCVD    Assessment & Plan:   Problem List Items Addressed This Visit  Cardiovascular and Mediastinum   Essential hypertension - Primary (Chronic)   BP elevated in the setting of not taking BP meds. Advised to resume and come in for Nurse BP check in 1-3 weeks.      Relevant Orders   Basic metabolic panel with GFR     Genitourinary   AKI (acute kidney injury) (HCC)   Recheck BMP.      Relevant Orders   Basic metabolic panel with GFR     Other   Lymphedema   Chronically non adherent to compression therapy.       Return in about 1 week (around 10/07/2023) for Nurse BP check.    [place] JAYSON Eastern, DO

## 2023-09-30 NOTE — Assessment & Plan Note (Signed)
 Chronically non adherent to compression therapy.

## 2023-09-30 NOTE — Assessment & Plan Note (Signed)
 BP elevated in the setting of not taking BP meds. Advised to resume and come in for Nurse BP check in 1-3 weeks.

## 2023-09-30 NOTE — Assessment & Plan Note (Signed)
 Recheck BMP.

## 2023-10-01 ENCOUNTER — Telehealth: Payer: Self-pay | Admitting: *Deleted

## 2023-10-01 LAB — BASIC METABOLIC PANEL WITH GFR
BUN/Creatinine Ratio: 13 (ref 12–28)
BUN: 45 mg/dL — ABNORMAL HIGH (ref 8–27)
CO2: 21 mmol/L (ref 20–29)
Calcium: 9.5 mg/dL (ref 8.7–10.3)
Chloride: 95 mmol/L — ABNORMAL LOW (ref 96–106)
Creatinine, Ser: 3.38 mg/dL — ABNORMAL HIGH (ref 0.57–1.00)
Glucose: 97 mg/dL (ref 70–99)
Potassium: 4.3 mmol/L (ref 3.5–5.2)
Sodium: 135 mmol/L (ref 134–144)
eGFR: 14 mL/min/1.73 — ABNORMAL LOW (ref >59)

## 2023-10-01 NOTE — Telephone Encounter (Signed)
 Copied from CRM [id number]. Topic: Clinical - Home Health Verbal Orders >> Oct 01, 2023  2:56 PM Susanna ORN wrote: Caller/Agency: [place], physical therapist w/[facility] Callback Number: [phone] Service Requested: Physical Therapy Frequency:  2 week-three & one week-six Any new concerns about the patient? Yes, she to let the doctor know that patient wasn't able to walk yesterday due to being in so much pain. States patient has arthritis in both knees and she was also tired from being at the doctor all day yesterday. States patient's pain level is 7/10.

## 2023-10-01 NOTE — Telephone Encounter (Signed)
 Return call to [place] [facility] with [facility]. Stated pt was c/o knee pain 7 out of 10. Stated BP was not high today. Stated pt's insurance only allow 12 visits per a 60 day period. And pt wants to continue PT but not OT - I told her I have to check with Dr Rosan about discontinuing OT. Stated went to SNF after being discharged from the hospital. Requesting verbal order PT 2 times a week x 3 weeks then once a week x 6 weeks. VO given for PT orders - sending to PCP for approval or denial. Thanks

## 2023-10-02 ENCOUNTER — Ambulatory Visit (HOSPITAL_BASED_OUTPATIENT_CLINIC_OR_DEPARTMENT_OTHER): Admitting: Internal Medicine

## 2023-10-02 ENCOUNTER — Ambulatory Visit: Payer: Self-pay | Admitting: Internal Medicine

## 2023-10-02 NOTE — Telephone Encounter (Signed)
 Call to patient o schedule an appointment.  Unable to reach or leave a voice mail.  MyChart message was sent.

## 2023-10-02 NOTE — Telephone Encounter (Signed)
 Yes may discontinue OT and continue PT

## 2023-10-02 NOTE — Progress Notes (Signed)
 Called pt again - no answer, mailbox full, unable to leave a message.

## 2023-10-02 NOTE — Telephone Encounter (Signed)
 Is it ok to discontinue OT?

## 2023-10-02 NOTE — Telephone Encounter (Signed)
 Agree with these HH orders

## 2023-10-02 NOTE — Telephone Encounter (Signed)
 I called [facility] [facility]; no answer- left message on her vm to discontinue OT.

## 2023-10-04 NOTE — Telephone Encounter (Addendum)
 Called pt to schedule an appt - stated she cannot come today. Stated she needs to talk to her daughter and call the office back to schedule an appt on Monday. Pt instructed to brings all of her meds with her. At this time we do have available appts.

## 2023-10-04 NOTE — Telephone Encounter (Signed)
 Pt has an appt 8/11 @ 0815 AM w/Dr [name].

## 2023-10-07 ENCOUNTER — Telehealth: Payer: Self-pay | Admitting: *Deleted

## 2023-10-07 ENCOUNTER — Other Ambulatory Visit: Payer: Self-pay | Admitting: Nurse Practitioner

## 2023-10-07 ENCOUNTER — Ambulatory Visit: Payer: Self-pay

## 2023-10-07 ENCOUNTER — Ambulatory Visit

## 2023-10-07 ENCOUNTER — Ambulatory Visit (INDEPENDENT_AMBULATORY_CARE_PROVIDER_SITE_OTHER): Admitting: Student

## 2023-10-07 VITALS — BP 146/90 | HR 76 | Temp 98.2°F | Ht 62.0 in | Wt 266.4 lb

## 2023-10-07 DIAGNOSIS — R300 Dysuria: Secondary | ICD-10-CM

## 2023-10-07 DIAGNOSIS — N1832 Chronic kidney disease, stage 3b: Secondary | ICD-10-CM | POA: Diagnosis not present

## 2023-10-07 DIAGNOSIS — I130 Hypertensive heart and chronic kidney disease with heart failure and stage 1 through stage 4 chronic kidney disease, or unspecified chronic kidney disease: Secondary | ICD-10-CM | POA: Diagnosis not present

## 2023-10-07 DIAGNOSIS — I10 Essential (primary) hypertension: Secondary | ICD-10-CM

## 2023-10-07 DIAGNOSIS — I5032 Chronic diastolic (congestive) heart failure: Secondary | ICD-10-CM | POA: Diagnosis not present

## 2023-10-07 DIAGNOSIS — N3946 Mixed incontinence: Secondary | ICD-10-CM | POA: Diagnosis not present

## 2023-10-07 NOTE — Assessment & Plan Note (Signed)
 With worsening renal insufficiency.  Nonoliguric.  Wonder if chronic volume overload in setting of HFpEF and CKD may be responsible for cardiorenal type syndrome in her.  Repeat BMP today.  I am going to start a trial of more aggressive outpatient diuretic therapy and have her come back in a week for a weight check and another check of renal function.  Also referral to nephrology placed.

## 2023-10-07 NOTE — Assessment & Plan Note (Signed)
 Chronic volume overload in this person with poorly controlled high blood pressure and history of CKD 3B.  Echo in 2021 showed grade 1 diastolic dysfunction.  She is on torsemide  20 mg daily.  She would benefit from the addition of an SGLT2 inhibitor and/or MRA if BP and kidney function are appropriate.

## 2023-10-07 NOTE — Assessment & Plan Note (Signed)
 BP Readings from Last 3 Encounters:  10/07/23 (!) 146/90  09/30/23 (!) 164/102  09/12/23 (!) 154/95   She remains somewhat hypertensive.  She is taking telmisartan  20 mg daily and amlodipine  10 mg daily.  Continue these medicines for now.  Given concomitant HFpEF, plan to start SGLT2 or MRA at some point in near future.

## 2023-10-07 NOTE — Progress Notes (Signed)
 Patient name: Vanessa Palmer Date of birth: June 09, 1950 Date of visit: 10/07/23  Subjective   Chief concern: elevated creatinine and high blood pressure  Seen last week for follow-up of hypertension. BP medications were restarted at that visit. BMP was obtained, notable for creatinine elevation.  Has a scale at home but needs some batteries.  Intermittently adherent to her torsemide  because of problems with incontinence.  Reports good adherence to her blood pressure medicine.  Current Meds  Medication Sig   amLODipine  (NORVASC ) 10 MG tablet Take 1 tablet (10 mg total) by mouth daily.   aspirin  EC 81 MG tablet Take 1 tablet (81 mg total) by mouth daily. Swallow whole.   atorvastatin  (LIPITOR ) 40 MG tablet Take 1 tablet (40 mg total) by mouth daily.   cyclobenzaprine  (FLEXERIL ) 10 MG tablet Take 1 tablet (10 mg total) by mouth 2 (two) times daily as needed for muscle spasms.   gabapentin  (NEURONTIN ) 300 MG capsule Take 1 capsule (300 mg total) by mouth 2 (two) times daily.   metoprolol  succinate (TOPROL -XL) 100 MG 24 hr tablet Take 1 tablet (100 mg total) by mouth daily. Take with or immediately following a meal.   pantoprazole  (PROTONIX ) 40 MG tablet Take 1 tablet (40 mg total) by mouth daily.   potassium chloride  SA (KLOR-CON  M) 20 MEQ tablet Take 1 tablet (20 mEq total) by mouth daily. Use only when taking torsemide .   sertraline  (ZOLOFT ) 100 MG tablet Take 1 tablet (100 mg total) by mouth daily.   telmisartan  (MICARDIS ) 20 MG tablet Take 1 tablet (20 mg total) by mouth daily.   torsemide  (DEMADEX ) 20 MG tablet Take 1 tablet (20 mg total) by mouth daily. (Patient taking differently: Take 20 mg by mouth as needed.)     Review of Systems  Respiratory:  Positive for cough (dry most of the time) and shortness of breath (exertional).   Cardiovascular:  Positive for orthopnea (2 pillows) and leg swelling (chronic).  Genitourinary:  Positive for frequency. Negative for flank pain and  hematuria.       Occasional mixed urge and stress incontinence  Neurological:  Negative for headaches.     Objective  Today's Vitals   10/07/23 0816 10/07/23 0824  BP: (!) 141/86 (!) 146/90  Pulse: 70 76  Temp: 98.2 F (36.8 C)   TempSrc: Oral   SpO2: 96%   Weight: 266 lb 6.4 oz (120.8 kg)   Height: 5' 2 (1.575 m)   Body mass index is 48.73 kg/m.   Physical Exam Constitutional:      Appearance: Normal appearance.  Cardiovascular:     Rate and Rhythm: Normal rate and regular rhythm.     Heart sounds: Murmur (Systolic) heard.     Comments: No JVD while upright Chronic appearing bilateral lower extremity edema Pulmonary:     Effort: Pulmonary effort is normal. No respiratory distress.     Breath sounds: No rales.  Skin:    General: Skin is warm and dry.  Neurological:     Mental Status: She is alert.     Cranial Nerves: No facial asymmetry.  Psychiatric:        Mood and Affect: Affect normal.        Speech: Speech normal.        Behavior: Behavior normal.      Assessment & Plan   Chronic heart failure with preserved ejection fraction (HCC) Assessment & Plan: Chronic volume overload in this person with poorly controlled high blood pressure and  history of CKD 3B.  Echo in 2021 showed grade 1 diastolic dysfunction.  She is on torsemide  20 mg daily.  She would benefit from the addition of an SGLT2 inhibitor and/or MRA if BP and kidney function are appropriate.  Orders: -     Basic metabolic panel with GFR  Stage 3b chronic kidney disease (HCC) Assessment & Plan: With worsening renal insufficiency.  Nonoliguric.  Wonder if chronic volume overload in setting of HFpEF and CKD may be responsible for cardiorenal type syndrome in her.  Repeat BMP today.  I am going to start a trial of more aggressive outpatient diuretic therapy and have her come back in a week for a weight check and another check of renal function.  Also referral to nephrology placed.  Orders: -     Basic  metabolic panel with GFR -     Urinalysis, Routine w reflex microscopic -     Ambulatory referral to Nephrology  Essential hypertension Assessment & Plan: BP Readings from Last 3 Encounters:  10/07/23 (!) 146/90  09/30/23 (!) 164/102  09/12/23 (!) 154/95   She remains somewhat hypertensive.  She is taking telmisartan  20 mg daily and amlodipine  10 mg daily.  Continue these medicines for now.  Given concomitant HFpEF, plan to start SGLT2 or MRA at some point in near future.  Orders: -     Basic metabolic panel with GFR    Return as needed for kidney health and high blood pressure.  [name] MD 10/07/2023, 9:30 AM

## 2023-10-07 NOTE — Addendum Note (Signed)
 Addended by: [name] SHARPER on: 10/07/2023 05:16 PM   Modules accepted: Orders

## 2023-10-07 NOTE — Telephone Encounter (Signed)
 Return call to [facility] - no answer; left message of office's return call.

## 2023-10-07 NOTE — Progress Notes (Signed)
 Internal Medicine Clinic Attending  Case discussed with the resident at the time of the visit.  We reviewed the resident's history and exam and pertinent patient test results.  I agree with the assessment, diagnosis, and plan of care documented in the resident's note.  H2PEF score with 96.6% probability of HFpEF (utilizing 02/2018 TTE as no PASP available on more recent TTE), suspect some of her chronic dyspnea, PND are a result of this. Will attempt to add SGLT2i and MRA as able, although her urinary incontinence has been an issue for her. Unclear if worsening AKI are a result of overdiuresis vs. Cardiorenal vs other, given difficult volume exam. For now, as pt states she has been urinating very frequently and she does not examine overtly fluid overloaded, will hold diuretics and see where her Cr trends. If this worsens her dyspnea and fluid retention, would consider adding SGLT2i and MRA and use torsemide  PRN

## 2023-10-07 NOTE — Telephone Encounter (Signed)
 Copied from CRM #[id number]. Topic: Clinical - Medication Refill >> Oct 07, 2023 12:11 PM Vivian Z wrote: Medication: oxyCODONE -acetaminophen  (PERCOCET) 10-325 MG tablet   Has the patient contacted their pharmacy? Yes (Agent: If no, request that the patient contact the pharmacy for the refill. If patient does not wish to contact the pharmacy document the reason why and proceed with request.) (Agent: If yes, when and what did the pharmacy advise?)  This is the patient's preferred pharmacy:  [id number] - [place], [place] - [address] AT [facility] OF [address]  [place] [address] Phone: [phone] Fax: [phone]  Is this the correct pharmacy for this prescription? Yes If no, delete pharmacy and type the correct one.   Has the prescription been filled recently? No  Is the patient out of the medication? Yes  Has the patient been seen for an appointment in the last year OR does the patient have an upcoming appointment? Yes  Can we respond through MyChart? No  Agent: Please be advised that Rx refills may take up to 3 business days. We ask that you follow-up with your pharmacy.

## 2023-10-07 NOTE — Telephone Encounter (Signed)
 Return call to pt who stated when she took off bandage, the site started bleeding so she put it back on and applied some pressure and elevated her arm. Stated it's not bleeding thru the bandage.I informed pt she's doing the correct thing.and to continue to watch it. An to let us  know if she develop any problems - stated she will.

## 2023-10-07 NOTE — Patient Instructions (Addendum)
 No follow-ups on file.  Stop taking the torsemide  for now.  For blood pressure, continue taking the telmisartan  and amlodipine .  Remember to bring all of the medications that you take (including over the counter medications and supplements) with you to every clinic visit.  This after visit summary is an important review of tests, referrals, and medication changes that were discussed during your visit. If you have questions or concerns, call [phone]. Outside of clinic business hours, call the main hospital at [phone] and ask the operator for the on-call internal medicine resident.   [name] MD 10/07/2023, 8:49 AM

## 2023-10-07 NOTE — Telephone Encounter (Signed)
 FYI Only or Action Required?: FYI only for provider.  Patient was last seen in primary care on 10/07/2023 by [name] Sharper, MD.  Called Nurse Triage reporting Bleeding/Bruising.  Symptoms began today.  Interventions attempted: Nothing.  Symptoms are: unchanged.  Triage Disposition: Home Care  Patient/caregiver understands and will follow disposition?: Yes  Copied from CRM [id number]. Topic: Clinical - Red Word Triage >> Oct 07, 2023  2:58 PM Miquel SAILOR wrote: Red Word that prompted transfer to Nurse Triage: Did lab work 08/11but it has not stop bleeding Reason for Disposition  Minor puncture wound  [1] NEEDLESTICK AND [2] unused (e.g., new needle)  Answer Assessment - Initial Assessment Questions 1. LOCATION: Where is the puncture located?      Lab puncture site  2. OBJECT: What was the object that punctured the skin?     venipuncture 3. DEPTH: How deep do you think the puncture goes?      unsure 4. ONSET: When did the injury occur? (e.g, minutes, hours)     This morning around 930.  5. PAIN: Is there any pain? If Yes, ask: How bad is the pain? (Scale 0-10; or none, mild, moderate, severe)     Denies  6. FOREIGN BODY SENSATION:  Did any of the object stay in the skin? Do you feel like something is still in the wound?     No    Additional info: Had Lab work around 930 this morning, she removed the bandage and site started bleeding again, she would like to know what to do.  Answer Assessment - Initial Assessment Questions 1. DEVICE: What punctured the skin?  (e.g., hollow injection needle, suture needle, knife blade, razor)     Venipuncture 2. LOCATION: Where is the puncture located?  (e.g., finger)     arm 3. DEPTH: How deep do you think the puncture goes?  (e.g., superficial)      4. ONSET: When did the injury occur? (e.g., minutes, hours, days)       Approximately 930 this morning 5. HOW OCCURRED: How did this happen?     Venipuncture for  phlebotomy 6. SOURCE BODY FLUID: What body fluid were you exposed to? (e.g., blood, spinal fluid, none)      no  Protocols used: Puncture Wound-A-AH, Needlestick-A-AH

## 2023-10-07 NOTE — Telephone Encounter (Signed)
 Copied from CRM [id number]. Topic: Clinical - Home Health Verbal Orders >> Oct 07, 2023 11:10 AM Farrel B wrote: Caller/Agency: [facility]  Callback Number: [phone] Service Requested: Occupational Therapy Frequency: 2 x's a week for 4 weeks  Any new concerns about the patient? Concerns regarding her vitals on Friday blood pressure was 140/98 on Friday

## 2023-10-08 ENCOUNTER — Ambulatory Visit: Payer: Self-pay | Admitting: Student

## 2023-10-08 LAB — MICROSCOPIC EXAMINATION
Bacteria, UA: NONE SEEN
Casts: NONE SEEN /LPF
RBC, Urine: NONE SEEN /HPF (ref 0–2)
WBC, UA: NONE SEEN /HPF (ref 0–5)

## 2023-10-08 LAB — URINALYSIS, ROUTINE W REFLEX MICROSCOPIC
Bilirubin, UA: NEGATIVE
Glucose, UA: NEGATIVE
Ketones, UA: NEGATIVE
Leukocytes,UA: NEGATIVE
Nitrite, UA: NEGATIVE
RBC, UA: NEGATIVE
Specific Gravity, UA: 1.015 (ref 1.005–1.030)
Urobilinogen, Ur: 1 mg/dL (ref 0.2–1.0)
pH, UA: 7 (ref 5.0–7.5)

## 2023-10-09 ENCOUNTER — Other Ambulatory Visit: Payer: Self-pay | Admitting: Internal Medicine

## 2023-10-09 DIAGNOSIS — M2550 Pain in unspecified joint: Secondary | ICD-10-CM

## 2023-10-09 LAB — BASIC METABOLIC PANEL WITH GFR
BUN/Creatinine Ratio: 13 (ref 12–28)
BUN: 31 mg/dL — ABNORMAL HIGH (ref 8–27)
CO2: 20 mmol/L (ref 20–29)
Calcium: 9.3 mg/dL (ref 8.7–10.3)
Chloride: 101 mmol/L (ref 96–106)
Creatinine, Ser: 2.47 mg/dL — ABNORMAL HIGH (ref 0.57–1.00)
Glucose: 85 mg/dL (ref 70–99)
Potassium: 5 mmol/L (ref 3.5–5.2)
Sodium: 136 mmol/L (ref 134–144)
eGFR: 20 mL/min/1.73 — ABNORMAL LOW (ref >59)

## 2023-10-09 LAB — URINE CULTURE

## 2023-10-09 MED ORDER — OXYCODONE-ACETAMINOPHEN 10-325 MG PO TABS: 1.0000 | ORAL_TABLET | Freq: Two times a day (BID) | ORAL | 0 refills | Status: DC | PRN

## 2023-10-09 NOTE — Telephone Encounter (Signed)
 Copied from CRM [id number]. Topic: Clinical - Medication Refill >> Oct 09, 2023 10:05 AM Suzette B wrote: Medication:  oxyCODONE -acetaminophen  (PERCOCET) 10-325 MG tablet  Has the patient contacted their pharmacy? Yes Patient had called the pharmacy and they stated it had been called in but hadn't heard anything from the provider This is the patient's preferred pharmacy:  [id number] - [place], [place] - [address] AT [facility] OF [address] [facility] [address] Phone: [phone] Fax: [phone]     Is this the correct pharmacy for this prescription? Yes If no, delete pharmacy and type the correct one.   Has the prescription been filled recently? Yes  Is the patient out of the medication? Yes  Has the patient been seen for an appointment in the last year OR does the patient have an upcoming appointment? Yes  Can we respond through MyChart? No  Agent: Please be advised that Rx refills may take up to 3 business days. We ask that you follow-up with your pharmacy.

## 2023-10-09 NOTE — Telephone Encounter (Signed)
 Improving renal function. Hold loop diuretic until appointment on Monday August 18 at which point return for weight check and repeat labs.  Urine studies checked because blood was noted in her incontinence underwear while being helped to restroom after wrapping visit up. No hematuria, examine for source of bleeding at follow-up.  Called and reviewed findings and plan with patient's daughter.  [name] MD 10/09/2023, 4:39 PM

## 2023-10-09 NOTE — Telephone Encounter (Signed)
 Oxycodone  was refilled today 10/09/23.

## 2023-10-14 ENCOUNTER — Ambulatory Visit: Admitting: Student

## 2023-10-14 ENCOUNTER — Encounter: Payer: Self-pay | Admitting: Student

## 2023-10-14 ENCOUNTER — Encounter: Admitting: Student

## 2023-10-14 VITALS — BP 143/91 | HR 80 | Temp 98.0°F | Ht 62.0 in | Wt 264.2 lb

## 2023-10-14 DIAGNOSIS — I110 Hypertensive heart disease with heart failure: Secondary | ICD-10-CM

## 2023-10-14 DIAGNOSIS — Z139 Encounter for screening, unspecified: Secondary | ICD-10-CM | POA: Diagnosis not present

## 2023-10-14 DIAGNOSIS — I10 Essential (primary) hypertension: Secondary | ICD-10-CM

## 2023-10-14 DIAGNOSIS — I5032 Chronic diastolic (congestive) heart failure: Secondary | ICD-10-CM

## 2023-10-14 MED ORDER — SPIRONOLACTONE 25 MG PO TABS: 25.0000 mg | ORAL_TABLET | Freq: Every day | ORAL | 11 refills | Status: DC

## 2023-10-14 NOTE — Assessment & Plan Note (Signed)
 Chronic, stable. Starting MRA per above. Counseled to start using scale for daily weights for adjustment of torsemide . I'm not sure how she's using it currently.

## 2023-10-14 NOTE — Assessment & Plan Note (Signed)
 Elderly female with severe chronic illness, currently living alone. Daughter accompanies her to her doctor visits. Her mobility is poor. I suspect poor medication adherence. Requested [facility] referral for appointment with RN and pharmacy. She should bring her medications with her to every appointment.

## 2023-10-14 NOTE — Patient Instructions (Addendum)
 Start using your scale daily.  Start taking spironolactone  25 mg daily for high blood pressure.  Stop taking your potassium supplements.  Come back in 2 weeks for repeat blood work.  Remember to bring all of the medications that you take (including over the counter medications and supplements) with you to every clinic visit.  This after visit summary is an important review of tests, referrals, and medication changes that were discussed during your visit. If you have questions or concerns, call [phone]. Outside of clinic business hours, call the main hospital at [phone] and ask the operator for the on-call internal medicine resident.   [name] MD 10/14/2023, 3:00 PM

## 2023-10-14 NOTE — Progress Notes (Signed)
 Patient name: Vanessa Palmer Date of birth: 12/18/1950 Date of visit: 10/14/23  Subjective   Reason for visit: hypertension  No new concerns since last visit.  Some bleeding was noted in her incontinence underwear by a medical assistant after wrap-up of last visit. She says this is from scratching under her pannus and denies bleeding from the vagina or rectum.  Review of Systems  Respiratory:  Positive for shortness of breath (with exertion).   Cardiovascular:  Positive for orthopnea.    Current Outpatient Medications  Medication Instructions   acetaminophen  (TYLENOL ) 500 MG tablet Take 2 tablets (1,000 mg total) by mouth 3 (three) times daily for 5 days, THEN 1 tablet (500 mg total) every 6 (six) hours as needed.   amLODipine  (NORVASC ) 10 mg, Oral, Daily   aspirin  EC 81 mg, Oral, Daily, Swallow whole.   atorvastatin  (LIPITOR ) 40 mg, Oral, Daily   budesonide -formoterol  (SYMBICORT ) 160-4.5 MCG/ACT inhaler 2 puffs, Inhalation, 2 times daily between meals and at bedtime PRN   cyclobenzaprine  (FLEXERIL ) 10 mg, Oral, 2 times daily PRN   EPINEPHrine  (EPI-PEN) 0.3 mg, Intramuscular, As needed   feeding supplement (ENSURE PLUS HIGH PROTEIN) LIQD 237 mLs, Oral, 2 times daily between meals   gabapentin  (NEURONTIN ) 300 mg, Oral, 2 times daily   hydrOXYzine  (ATARAX ) 25 mg, Every 6 hours PRN   metoprolol  succinate (TOPROL -XL) 100 mg, Oral, Daily, Take with or immediately following a meal.   nitroGLYCERIN  (NITROSTAT ) 0.4 mg, Sublingual, Every 5 min PRN   oxyCODONE -acetaminophen  (PERCOCET) 10-325 MG tablet 1 tablet, Oral, 2 times daily PRN, Take by mouth in the morning and afternoon (separated by at least 8 hours). Do not use at night.   oxyCODONE -acetaminophen  (PERCOCET) 10-325 MG tablet 1 tablet, Oral, Every 12 hours PRN   pantoprazole  (PROTONIX ) 40 mg, Oral, Daily   rivaroxaban  (XARELTO ) 20 MG TABS tablet TAKE ONE TABLET BY MOUTH DAILY WITH SUPPER   sertraline  (ZOLOFT ) 100 mg, Oral, Daily    spironolactone  (ALDACTONE ) 25 mg, Oral, Daily   telmisartan  (MICARDIS ) 20 mg, Oral, Daily   tolterodine  (DETROL  LA) 2 mg, Oral, Daily   torsemide  (DEMADEX ) 20 mg, Oral, Daily     Objective  Today's Vitals   10/14/23 1400 10/14/23 1443  BP: (!) 157/102 (!) 143/91  Pulse: 78 80  Temp: 98 F (36.7 C)   TempSrc: Oral   SpO2: 97%   Weight: 264 lb 3.2 oz (119.8 kg)   Height: 5' 2 (1.575 m)   Body mass index is 48.32 kg/m.   Physical Exam Constitutional:      Appearance: Normal appearance.     Comments: Poor mobility, 2-person assist to get on exam table  Cardiovascular:     Rate and Rhythm: Normal rate and regular rhythm.     Heart sounds: No murmur heard.    Comments: No JVD noted Pulmonary:     Effort: Pulmonary effort is normal. No respiratory distress.  Musculoskeletal:     Comments: Brawny-appearing lower extremity edema bilaterally  Skin:    General: Skin is warm and dry.  Neurological:     Mental Status: She is alert.     Cranial Nerves: No facial asymmetry.  Psychiatric:        Mood and Affect: Affect normal.        Speech: Speech normal.        Behavior: Behavior normal.      Assessment & Plan   Essential hypertension Assessment & Plan: Chronic, not at goal.  BP Readings  from Last 3 Encounters:  10/14/23 (!) 143/91  10/07/23 (!) 146/90  09/30/23 (!) 164/102   Start spironolactone  25 mg daily. Return to clinic in 2 weeks for repeat BMP. Continue telmisartan  20 mg, toprol -xl 100 mg, and amlodipine  10 mg daily.  Orders: -     AMB Referral [facility] Care Management -     Spironolactone ; Take 1 tablet (25 mg total) by mouth daily.  Dispense: 30 tablet; Refill: 11 -     Basic metabolic panel with GFR; Future  Chronic heart failure with preserved ejection fraction [facility]) Assessment & Plan: Chronic, stable. Starting MRA per above. Counseled to start using scale for daily weights for adjustment of torsemide . I'm not sure how she's using it  currently.  Orders: -     AMB Referral [facility] Care Management -     Spironolactone ; Take 1 tablet (25 mg total) by mouth daily.  Dispense: 30 tablet; Refill: 11 -     Basic metabolic panel with GFR; Future  Encounter for screening involving social determinants of health [facility]) Assessment & Plan: Elderly female with severe chronic illness, currently living alone. Daughter accompanies her to her doctor visits. Her mobility is poor. I suspect poor medication adherence. Requested [facility] referral for appointment with RN and pharmacy. She should bring her medications with her to every appointment.  Orders: -     AMB Referral [facility] Care Management    Return in about 2 weeks (around 10/28/2023) for blood work.  [name] MD 10/14/2023, 5:26 PM

## 2023-10-14 NOTE — Assessment & Plan Note (Signed)
 Chronic, not at goal.  BP Readings from Last 3 Encounters:  10/14/23 (!) 143/91  10/07/23 (!) 146/90  09/30/23 (!) 164/102   Start spironolactone  25 mg daily. Return to clinic in 2 weeks for repeat BMP. Continue telmisartan  20 mg, toprol -xl 100 mg, and amlodipine  10 mg daily.

## 2023-10-15 NOTE — Progress Notes (Signed)
 Internal Medicine Clinic Attending  Case discussed with the resident at the time of the visit.  We reviewed the resident's history and exam and pertinent patient test results.  I agree with the assessment, diagnosis, and plan of care documented in the resident's note.  78F with multiple comorbidities, metabolic syndrome p/w chronic dyspnea, PND. BP uncontrolled today. Much of her symptoms are likely due to underlying poorly managed HFpEF, OSA, immobility in s/o chronic pain. We are limited in therapeutics due to difficulty with medication adherence and resistance to increasing medications, declining renal function and urinary incontinence that prohibits uptitration of diuretics. She also has poor insight into her condition as well as MDD that affects her QoL. With her age, comorbidities and poor QoL, I do think a GOC conversation would be appropriate as it would allow for us  to focus on her needs and desires (e.g. symptom alleviation, medication burden) and also manage expectations of disease course. For now, I do think greater assistance with medication management and home care would be beneficial.   Jone Dauphin MD

## 2023-10-21 ENCOUNTER — Other Ambulatory Visit: Payer: Self-pay

## 2023-10-21 NOTE — Patient Instructions (Signed)
 Visit Information  Thank you for taking time to visit with me today. Please don't hesitate to contact me if I can be of assistance to you before our next scheduled appointment.  Your next care management appointment is by telephone on 11/11/23 at 3:30 PM  Please call the care guide team at [phone] if you need to cancel, schedule, or reschedule an appointment.   Please call the Suicide and Crisis Lifeline: 988 call 1-800-273-TALK (toll free, 24 hour hotline) if you are experiencing a Mental Health or Behavioral Health Crisis or need someone to talk to.  Rosaline Finlay, RN MSN [place]  [facility] Population Health RN Care Manager Direct Dial: [phone]  Fax: [phone]

## 2023-10-21 NOTE — Patient Outreach (Signed)
 Complex Care Management   Visit Note  10/21/2023  Name:  Vanessa Palmer MRN: [id number] DOB: September 15, 1950  Situation: Referral received for Complex Care Management related to Heart Failure and HTN I obtained verbal consent from Patient.  Visit completed with Patient  on the phone  Background:   Past Medical History:  Diagnosis Date   Acute renal failure superimposed on stage 3b chronic kidney disease, unspecified acute renal failure type (HCC) 09/09/2023   ALCOHOL ABUSE 12/13/2005   Annotation: Sober since 11/06 Qualifier: Diagnosis of  By: Elnor MD, Comer     Anemia    Arthritis    all over (02/21/2017)   CAD (coronary artery disease)    s/p non-Q wave MI in 06/2001 and 2004, 2005   CHF (congestive heart failure) (HCC)    Chronic kidney disease (CKD), stage III (moderate) (HCC) 09/19/2010   Chronic mid back pain    Depression    DJD (degenerative joint disease)    DVT (deep venous thrombosis) (HCC)    BLE   DVT of lower extremity, bilateral (HCC) 04/04/2012   On Xarelto      GERD (gastroesophageal reflux disease)    Gout    History of alcohol abuse 12/13/2005   Annotation: Sober since 11/06           Hyperlipidemia    Hypertension    INTRINSIC ASTHMA, WITH EXACERBATION 09/27/2009   Qualifier: Diagnosis of  By: Danella MD, Nodira     Migraine    none anymore (02/21/2017)   Myocardial infarction [facility]) ?2005   Obesity    OSA (obstructive sleep apnea)    previously used CPAP, has lost it (02/21/2017)   Pressure injury of skin 09/09/2023   Rhabdomyolysis 09/09/2023   Seizures (HCC)    As a teenager    Traumatic rhabdomyolysis (HCC) 09/09/2023    Assessment: Patient Reported Symptoms:  Cognitive Cognitive Status: Able to follow simple commands, Alert and oriented to person, place, and time, Normal speech and language skills, Struggling with memory recall Cognitive/Intellectual Conditions Management [RPT]: None reported or documented in medical history or problem  list      Neurological Neurological Review of Symptoms: No symptoms reported    HEENT HEENT Symptoms Reported: Not assessed      Cardiovascular Cardiovascular Symptoms Reported: Swelling in legs or feet (Swelling RLE > LLE) Does patient have uncontrolled Hypertension?: Yes Is patient checking Blood Pressure at home?: Yes (sometimes) Patient's Recent BP reading at home: Patient does not remember most recent reading. Advised to keep a log of readings. Cardiovascular Management Strategies: Medication therapy, Medical device (Compression socks occasionally) Cardiovascular Comment: Patient reports she has a scale but it is not working. She states her daughter is going to be getting her a new scale.  Respiratory Respiratory Symptoms Reported: No symptoms reported    Endocrine Endocrine Symptoms Reported: Not assessed Is patient diabetic?: No    Gastrointestinal Gastrointestinal Symptoms Reported: No symptoms reported Additional Gastrointestinal Details: Patient reports her appeite is so-so. Denies unintentional weight loss. Last BM today.      Genitourinary Genitourinary Symptoms Reported: No symptoms reported    Integumentary Integumentary Symptoms Reported: No symptoms reported    Musculoskeletal Musculoskelatal Symptoms Reviewed: Back pain, Difficulty walking, Unsteady gait, Joint pain Additional Musculoskeletal Details: Patient reports pain in her L shoulder, which she attributes to her fall about a month ago Musculoskeletal Management Strategies: Exercise, Medical device, Adequate rest (Cane, walker, wheelchair) Musculoskeletal Comment: Patient reports she continues to work with [facility] PT and  OT ([facility]). She thinks they are coming 2-3 times a week. Patient states I feel good. Falls in the past year?: Yes Number of falls in past year: 2 or more (No falls since previous CMRN visit) Was there an injury with Fall?: Yes Fall Risk Category Calculator: 3 Patient Fall Risk Level:  High Fall Risk Patient at Risk for Falls Due to: History of fall(s), Impaired balance/gait Fall risk Follow up: Falls evaluation completed, Education provided, Falls prevention discussed  Psychosocial Psychosocial Symptoms Reported: Depression - if selected complete PHQ 2-9 Additional Psychological Details: Patient declines LCSW/counseling referral at this time. Behavioral Management Strategies: Support system, Medication therapy Major Change/Loss/Stressor/Fears (CP): Death of a loved one (Brother passed away while she was at [facility], son passed away recently) Techniques to [place] with Loss/Stress/Change: Medication Quality of Family Relationships: helpful, involved, supportive Do you feel physically threatened by others?: No    10/21/2023    PHQ2-9 Depression Screening   Little interest or pleasure in doing things More than half the days  Feeling down, depressed, or hopeless Nearly every day  PHQ-2 - Total Score 5  Trouble falling or staying asleep, or sleeping too much Nearly every day  Feeling tired or having little energy More than half the days  Poor appetite or overeating  More than half the days  Feeling bad about yourself - or that you are a failure or have let yourself or your family down Not at all  Trouble concentrating on things, such as reading the newspaper or watching television Several days  Moving or speaking so slowly that other people could have noticed.  Or the opposite - being so fidgety or restless that you have been moving around a lot more than usual Not at all  Thoughts that you would be better off dead, or hurting yourself in some way Not at all  PHQ2-9 Total Score 13  If you checked off any problems, how difficult have these problems made it for you to do your work, take care of things at home, or get along with other people    Depression Interventions/Treatment Medication    There were no vitals filed for this visit.  Medications Reviewed Today     Reviewed by  Arno Rosaline SQUIBB, RN (Registered Nurse) on 10/21/23 at 1600  Med List Status: <None>   Medication Order Taking? Sig Documenting Provider Last Dose Status Informant  amLODipine  (NORVASC ) 10 MG tablet [id number] Yes Take 1 tablet (10 mg total) by mouth daily. Briana Elgin LABOR, MD  Active   aspirin  EC 81 MG tablet [id number] Yes Take 1 tablet (81 mg total) by mouth daily. Swallow whole. Addie Perkins, DO  Active Pharmacy Records  atorvastatin  (LIPITOR ) 40 MG tablet [id number] Yes Take 1 tablet (40 mg total) by mouth daily. Addie Perkins, DO  Active Pharmacy Records           Med Note STEFFI, ALEXANDRIA   Tue Sep 10, 2023 12:36 PM)  Recent Dispenses     08/18/2023 40 MG TABS (disp 90, 90d supply) 06/11/2023 40 MG TABS (disp 90, 90d supply)     budesonide -formoterol  (SYMBICORT ) 160-4.5 MCG/ACT inhaler [id number] Yes Inhale 2 puffs into the lungs 3 times/day as needed-between meals & bedtime. Leopold Perkins NOVAK, MD  Active Pharmacy Records           Med Note STEFFI, [place]A   Tue Sep 10, 2023 12:37 PM) No recent fills  cyclobenzaprine  (FLEXERIL ) 10 MG tablet [id number] Yes Take 1 tablet (10 mg  total) by mouth 2 (two) times daily as needed for muscle spasms. Vernetta Lonni GRADE, MD  Active Pharmacy Records           Med Note STEFFI, [place]A   Tue Sep 10, 2023 12:37 PM) Recent Dispenses    08/09/2023 10 MG TABS (disp 30, 15d supply) 05/30/2023 10 MG TABS (disp 30, 15d supply)    EPINEPHrine  0.3 mg/0.3 mL IJ SOAJ injection [id number] Yes Inject 0.3 mg into the muscle as needed for anaphylaxis. Leopold Damien NOVAK, MD  Active Pharmacy Records           Med Note [facility]   Mon Sep 09, 2023  9:30 PM)    feeding supplement (ENSURE PLUS HIGH PROTEIN) LIQD [id number]  Take 237 mLs by mouth 2 (two) times daily between meals.  Patient not taking: Reported on 10/21/2023   Briana Elgin LABOR, MD  Active   gabapentin  (NEURONTIN ) 300 MG capsule [id number] Yes Take 1 capsule (300 mg total) by mouth 2  (two) times daily. Briana Elgin LABOR, MD  Active   hydrOXYzine  (ATARAX ) 25 MG tablet [id number] Yes Take 25 mg by mouth every 6 (six) hours as needed for itching. [provider]  Active Pharmacy Records           Med Note STEFFI, ALEX[place]A   Tue Sep 10, 2023 12:38 PM) No recent fills   metoprolol  succinate (TOPROL -XL) 100 MG 24 hr tablet [id number] Yes Take 1 tablet (100 mg total) by mouth daily. Take with or immediately following a meal. Briana Elgin LABOR, MD  Active   nitroGLYCERIN  (NITROSTAT ) 0.4 MG SL tablet [id number] Yes Place 1 tablet (0.4 mg total) under the tongue every 5 (five) minutes as needed for chest pain. [name], DO  Active Pharmacy Records  oxyCODONE -acetaminophen  [facility]) 10-325 MG tablet [id number] Yes Take 1 tablet by mouth 2 (two) times daily as needed for pain. Take by mouth in the morning and afternoon (separated by at least 8 hours). Do not use at night. Briana Elgin LABOR, MD  Active   oxyCODONE -acetaminophen  (PERCOCET) 10-325 MG tablet [id number] Yes Take 1 tablet by mouth every 12 (twelve) hours as needed. [name], DO  Active   pantoprazole  (PROTONIX ) 40 MG tablet [id number] Yes Take 1 tablet (40 mg total) by mouth daily. [name], DO  Active Pharmacy Records           Med Note STEFFI NIAN   Tue Sep 10, 2023 12:39 PM) Recent Dispenses    08/27/2023 40 MG TBEC (disp 30, 30d supply) 01/31/2023 40 MG TBEC (disp 30, 30d supply)    rivaroxaban  (XARELTO ) 20 MG TABS tablet [id number] Yes TAKE ONE TABLET BY MOUTH DAILY WITH SUPPER Addie Damien, DO  Active Pharmacy Records           Med Note STEFFI, NIAN   Tue Sep 10, 2023 12:39 PM) Recent Dispenses    06/11/2023 20 MG TABS (disp 90, 90d supply) 03/11/2023 20 MG TABS (disp 90, 90d supply)    sertraline  (ZOLOFT ) 100 MG tablet [id number] Yes Take 1 tablet (100 mg total) by mouth daily. Gregary Sharper, MD  Active Pharmacy Records           Med Note STEFFI, NIAN   Tue Sep 10, 2023 12:39 PM) Recent Dispenses    08/18/2023 100 MG TABS (disp 90, 90d supply) 07/12/2023 100 MG TABS (disp 31, 31d supply)    spironolactone  (ALDACTONE ) 25 MG tablet [id number] Yes Take 1 tablet (  25 mg total) by mouth daily. [name] Sharper, MD  Active   telmisartan  (MICARDIS ) 20 MG tablet [id number] Yes Take 1 tablet (20 mg total) by mouth daily. [name], DO  Active Pharmacy Records  tolterodine  (DETROL  LA) 2 MG 24 hr capsule [id number] Yes Take 1 capsule (2 mg total) by mouth daily. Addie Perkins, DO  Active Pharmacy Records           Med Note STEFFI, A[place]A   Tue Sep 10, 2023 12:41 PM)  Recent Dispenses     07/16/2023 2 MG CP24 (disp 30, 30d supply) 06/14/2023 2 MG CP24 (disp 30, 30d supply)     torsemide  (DEMADEX ) 20 MG tablet [id number]  Take 1 tablet (20 mg total) by mouth daily.  Patient not taking: Reported on 10/21/2023   [name], DO  Active Pharmacy Records           Med Note STEFFI, ADELITA   Tue Sep 10, 2023 12:41 PM) Recent Dispenses    08/27/2023 20 MG TABS (disp 90, 90d supply) 06/20/2023 20 MG TABS (disp 180, 90d supply) 09/27/2022 20 MG TABS (disp 180, 90d supply)              Recommendation:   Continue Current Plan of Care Obtain new scale so you can begin checking your weight daily! Write blood pressure down and bring readings to all of your future doctor's appointments  Follow Up Plan:   Telephone follow up appointment date/time:  11/11/23 at 3:30 PM  Rosaline Finlay, RN MSN [place]  [facility] RN Care Manager Direct Dial: [phone]  Fax: [phone]

## 2023-10-22 ENCOUNTER — Telehealth: Payer: Self-pay | Admitting: *Deleted

## 2023-10-22 NOTE — Telephone Encounter (Signed)
 I talked to [place] with [facility], who stated pt's insurance allow 12 visits. Pt stated she wanted to continue OT and put PT on hold. And Charisse stated hopefully after 6 weeks,PT can be restarted, depending on pt's insurance. Also Charisse advised pt to call our to schedule an appt about her foot; I called pt who stated the skin is hanging off her right heel. Pt already has lab appt 9/2; pt agreed to see a doctor also @ 1045 AM on this day. Pt advised to keep heel clean/dry.

## 2023-10-22 NOTE — Telephone Encounter (Signed)
 Copied from CRM [id number]. Topic: Clinical - Home Health Verbal Orders >> Oct 22, 2023  3:40 PM Laurier BROCKS wrote: Caller/Agency: [place] [facility]  Callback Number: [phone] Service Requested: Physical Therapy Frequency: [place] wants to advise the patient's insurance only authorizes 12 visits. Patient has used 9 visits and has 3 remaining visits. PT with [facility] will be placed on hold until the patient is able to get additional visits for PT services. Any new concerns about the patient? Yes, patient discussed pain on her foot (PT therapist unsure of what foot) with some skin coming off. Patient advised nurse it is hard to place weight on it. Patient was advised to schedule an appointment but has failed to do so.

## 2023-10-23 NOTE — Telephone Encounter (Signed)
 Yes that is fine to put PT on hold

## 2023-10-24 ENCOUNTER — Telehealth: Payer: Self-pay | Admitting: *Deleted

## 2023-10-24 NOTE — Telephone Encounter (Signed)
 Agree

## 2023-10-24 NOTE — Telephone Encounter (Signed)
 RTC to Will OT [facility].  Need verbal orders for OT 1 time a week every other week starting 10/27/2023.  Will start next week for 5 weeks.  .  Plan to work on Transferring.  Upper and lower body dressing and Bathing.  Verbal approval was given.  Will forward to PCP for agreement or denial.  Different scheduling option done due to patient's insurance coverage.  Copied from CRM [id number]. Topic: Clinical - Home Health Verbal Orders >> Oct 24, 2023  8:33 AM Miquel SAILOR wrote: Caller/Agency: [facility] from [facility]  Callback Number: [phone] Service Requested: Occupational Therapy Frequency: Effective 08/31-1 week 2 2 week 5 Any new concerns about the patient? No

## 2023-10-29 ENCOUNTER — Encounter: Payer: Self-pay | Admitting: Student

## 2023-10-29 ENCOUNTER — Other Ambulatory Visit: Payer: Self-pay

## 2023-10-29 ENCOUNTER — Ambulatory Visit: Admitting: Student

## 2023-10-29 ENCOUNTER — Other Ambulatory Visit

## 2023-10-29 VITALS — BP 139/91 | HR 92 | Temp 97.9°F | Ht 62.0 in | Wt 250.4 lb

## 2023-10-29 DIAGNOSIS — L859 Epidermal thickening, unspecified: Secondary | ICD-10-CM

## 2023-10-29 DIAGNOSIS — I5032 Chronic diastolic (congestive) heart failure: Secondary | ICD-10-CM

## 2023-10-29 DIAGNOSIS — I10 Essential (primary) hypertension: Secondary | ICD-10-CM | POA: Diagnosis not present

## 2023-10-29 NOTE — Progress Notes (Signed)
 CC: Evaluation of right posterior heel skin breakdown  HPI:  [name] is a 73 y.o. female with a PMH stated below who presents today for evaluation of her foot.  Please see problem based assessment and plan for additional details.  Past Medical History:  Diagnosis Date   Acute renal failure superimposed on stage 3b chronic kidney disease, unspecified acute renal failure type (HCC) 09/09/2023   ALCOHOL ABUSE 12/13/2005   Annotation: Sober since 11/06 Qualifier: Diagnosis of  By: Elnor MD, Comer     Anemia    Arthritis    all over (02/21/2017)   CAD (coronary artery disease)    s/p non-Q wave MI in 06/2001 and 2004, 2005   CHF (congestive heart failure) (HCC)    Chronic kidney disease (CKD), stage III (moderate) (HCC) 09/19/2010   Chronic mid back pain    Depression    DJD (degenerative joint disease)    DVT (deep venous thrombosis) (HCC)    BLE   DVT of lower extremity, bilateral (HCC) 04/04/2012   On Xarelto      GERD (gastroesophageal reflux disease)    Gout    History of alcohol abuse 12/13/2005   Annotation: Sober since 11/06           Hyperlipidemia    Hypertension    INTRINSIC ASTHMA, WITH EXACERBATION 09/27/2009   Qualifier: Diagnosis of  By: Danella MD, Nodira     Migraine    none anymore (02/21/2017)   Myocardial infarction [facility]) ?2005   Obesity    OSA (obstructive sleep apnea)    previously used CPAP, has lost it (02/21/2017)   Pressure injury of skin 09/09/2023   Rhabdomyolysis 09/09/2023   Seizures (HCC)    As a teenager    Traumatic rhabdomyolysis (HCC) 09/09/2023    Review of Systems: ROS negative except for what is noted on the assessment and plan.  Vitals:   10/29/23 1021 10/29/23 1028  BP: (!) 145/92 (!) 139/91  Pulse: 78 92  Temp: 97.9 F (36.6 C)   TempSrc: Oral   SpO2: 96%   Weight: 250 lb 6.4 oz (113.6 kg)   Height: 5' 2 (1.575 m)     Physical Exam: Constitutional: obese but well-appearing woman in no acute  distress. She is sitting in wheelchair Cardiovascular: regular rate and rhythm, no m/r/g Pulmonary/Chest: normal work of breathing on room air, lungs clear to auscultation bilaterally MSK: obese. Profound LE chronic edema with secondary hyperkaratosis. L posterior heel has mild exfoliation of superficial epidermis without underlying ulceration or breakdown, plantar surface is healthy. Neurological: alert & oriented x 3, no focal deficit Skin: warm and dry. Thick and woody LE bilaterally. Psych: normal mood and behavior  Assessment & Plan:   Patient discussed with Dr. Karna  Skin hyperkeratosis Here for evaluation of her right posterior heel.  Notes skin breakdown ongoing for a few weeks.  It is superficial on exam, fortunately.  Underlying epidermis is healthy.  There is no blister, ulceration, injury, drainage.  Looks like some superficial peeling of thickened skin.  Chronic and severe lymphedema is likely contributory, she does not want compression bandages.  I performed very superficial debridement of overlying lose and dead skin left margins near healthy intact skin.  Recommend heavy moisturizer use and avoidance of friction.  RTC as needed for this issue.   RTC PRN for this issue. Has office visit scheduled 9/17.  Lonni Africa, D.O. [facility] Internal Medicine, PGY-2 Phone: [phone] Date 10/29/2023 Time 1:45 PM

## 2023-10-29 NOTE — Assessment & Plan Note (Addendum)
 Here for evaluation of her right posterior heel.  Notes skin breakdown ongoing for a few weeks.  Not a pressure wound, she is not bedbound, she can walk.  It is superficial on exam, fortunately.  Underlying epidermis is healthy.  There is no blister, ulceration, injury, drainage.  Looks like some superficial peeling of thickened skin.  Chronic and severe lymphedema is likely contributory and she does not want compression bandages.  I performed very superficial debridement of overlying lose and dead skin and left margins near healthy intact skin.  Recommend heavy moisturizer use and avoidance of friction.  RTC as needed for this issue.

## 2023-10-29 NOTE — Patient Instructions (Signed)
 Your next clinic appointment is on 9/17  Keep your heel hydrated with a moisturizing cream of choice

## 2023-10-30 LAB — BASIC METABOLIC PANEL WITH GFR
BUN/Creatinine Ratio: 11 — ABNORMAL LOW (ref 12–28)
BUN: 20 mg/dL (ref 8–27)
CO2: 17 mmol/L — ABNORMAL LOW (ref 20–29)
Calcium: 8.9 mg/dL (ref 8.7–10.3)
Chloride: 102 mmol/L (ref 96–106)
Creatinine, Ser: 1.74 mg/dL — ABNORMAL HIGH (ref 0.57–1.00)
Glucose: 93 mg/dL (ref 70–99)
Potassium: 4.6 mmol/L (ref 3.5–5.2)
Sodium: 135 mmol/L (ref 134–144)
eGFR: 31 mL/min/1.73 — ABNORMAL LOW (ref >59)

## 2023-10-30 NOTE — Progress Notes (Signed)
 Internal Medicine Clinic Attending  Case discussed with the resident at the time of the visit.  We reviewed the resident's history and exam and pertinent patient test results.  I agree with the assessment, diagnosis, and plan of care documented in the resident's note.

## 2023-11-05 ENCOUNTER — Telehealth: Payer: Self-pay | Admitting: *Deleted

## 2023-11-05 NOTE — Progress Notes (Signed)
 Care Guide Pharmacy Note  11/05/2023 Name: Vanessa Palmer MRN: [id number] DOB: 1950-08-15  Referred By: [name], DO Reason for referral: Complex Care Management (Initial outreach to schedule referral with PharmD )   Vanessa Palmer is a 73 y.o. year old female who is a primary care patient of [name], DO.  Vanessa Palmer was referred to the pharmacist for assistance related to: HTN and HF   Successful contact was made with the patient to discuss pharmacy services including being ready for the pharmacist to call at least 5 minutes before the scheduled appointment time and to have medication bottles and any blood pressure readings ready for review. The patient agreed to meet with the pharmacist via 9/15 at 10:30 AM on (date/time).  Vanessa Palmer  [facility]  [facility], [facility] Guide  Direct Dial: [phone]  Fax [phone]

## 2023-11-05 NOTE — Progress Notes (Signed)
 Care Guide Pharmacy Note  11/05/2023 Name: Vanessa Palmer MRN: [id number] DOB: 02-25-1951  Referred By: [name], DO Reason for referral: Complex Care Management (Initial outreach to schedule referral with PharmD )   Vanessa Palmer is a 72 y.o. year old female who is a primary care patient of [name], DO.  Vanessa Palmer was referred to the pharmacist for assistance related to: DMII and HF  An unsuccessful telephone outreach was attempted today to contact the patient who was referred to the pharmacy team for assistance with medication management. Additional attempts will be made to contact the patient. Harlene Satterfield  [facility]  Value-Based Care Institute, [facility] Guide  Direct Dial: [phone]  Fax [phone]

## 2023-11-06 ENCOUNTER — Telehealth: Payer: Self-pay | Admitting: Orthopaedic Surgery

## 2023-11-06 ENCOUNTER — Other Ambulatory Visit: Payer: Self-pay | Admitting: Internal Medicine

## 2023-11-06 MED ORDER — OXYCODONE-ACETAMINOPHEN 10-325 MG PO TABS: 1.0000 | ORAL_TABLET | Freq: Two times a day (BID) | ORAL | 0 refills | Status: DC | PRN

## 2023-11-06 NOTE — Telephone Encounter (Signed)
 FYI Only or Action Required?: Action required by provider: medication refill request.  Patient was last seen in primary care on 10/29/2023 by [name] Bruckner, DO.  Called Nurse Triage reporting No chief complaint on file..  Symptoms began today.  Interventions attempted: Nothing.  Symptoms are: stable.  Triage Disposition: No disposition on file.  Patient/caregiver understands and will follow disposition?:

## 2023-11-06 NOTE — Telephone Encounter (Signed)
 Patient called and said she needs a refill on muscle relaxers. CB#[phone]

## 2023-11-06 NOTE — Telephone Encounter (Signed)
 Copied from CRM #[id number]. Topic: Clinical - Medication Refill >> Nov 06, 2023 11:38 AM Vivian Z wrote: Medication: oxyCODONE -acetaminophen  (PERCOCET) 10-325 MG tablet   Has the patient contacted their pharmacy? Yes (Agent: If no, request that the patient contact the pharmacy for the refill. If patient does not wish to contact the pharmacy document the reason why and proceed with request.) (Agent: If yes, when and what did the pharmacy advise?)  This is the patient's preferred pharmacy:  [id number] - [place], [place] - [address] AT [facility] [address] [place] [address] Phone: [phone] Fax: [phone]  Is this the correct pharmacy for this prescription? Yes If no, delete pharmacy and type the correct one.   Has the prescription been filled recently? No  Is the patient out of the medication? Yes  Has the patient been seen for an appointment in the last year OR does the patient have an upcoming appointment? Yes  Can we respond through MyChart? No  Agent: Please be advised that Rx refills may take up to 3 business days. We ask that you follow-up with your pharmacy.

## 2023-11-07 ENCOUNTER — Ambulatory Visit: Payer: Self-pay | Admitting: Student

## 2023-11-11 ENCOUNTER — Telehealth: Payer: Self-pay | Admitting: Internal Medicine

## 2023-11-11 ENCOUNTER — Ambulatory Visit

## 2023-11-11 ENCOUNTER — Other Ambulatory Visit: Payer: Self-pay

## 2023-11-11 NOTE — Telephone Encounter (Signed)
 Copied from CRM #[id number]. Topic: Appointments - Appointment Cancel/Reschedule >> Nov 11, 2023  9:41 AM Graeme ORN wrote: Patient/patient representative is calling to cancel or reschedule an appointment. Refer to attachments for appointment information.   Patient wants to cancel / reschedule appt today with pharmacist due to sick.

## 2023-11-11 NOTE — Progress Notes (Deleted)
 11/11/2023 Name: Vanessa Palmer MRN: [id number] DOB: [birth year]  No chief complaint on file.   Vanessa Palmer is a 73 y.o. year old female who was referred for medication management by their primary care provider, Vanessa Dayton BROCKS, DO. They presented for a face to face visit today.   They were referred to the pharmacist by their PCP for assistance in managing hypertension . PMH includes HTN, chronic stable angina, asthma, HFpEF with LVH (EF***), OSA, asthma, GERD, hearing loss, CKD3, HLD, depression, bilateral DVT in 2014 (on lifelong anticoag).   Subjective: Patient was last seen by PCP, Dr. [name], on 10/14/23. At last visit, BP was 143/91 mmHg, HR 80 bpm. She was instructed to start spironolactone  25 mg daily, continue telmisartan  20 mg daily, amlodipine  10 mg daily, and metoprolol  succinate 100 mg daily. She was instructed to start taking daily weights to guide adjustment in diuretic (torsemide ) dose. On repeat BMP on 10/29/23 (after appt with Dr. [name]), Scr improved from 2.47 > 1.74 mg/dL (eGFR 20 > 31 mL/min) after starting spironolactone . K decreased from 5.0 > 4.6 mE/L.   Today, patient presents in *** good spirits and presents without *** any assistance. ***Patient is accompanied by ***.    Care Team: Primary Care Provider: [name], DO ; Next Scheduled Visit: 11/13/23 with Dr. [name] {careteamprovider:27366}  Medication Access/Adherence  Current Pharmacy:  [id number] - [place], [place] - [address] AT [facility] [address] [place] [address] Phone: [phone] Fax: [phone]  [id number] - [place], [place] - [address] AT [facility] [address] [place] [address] Phone: [phone] Fax: [phone]  [place] [facility] [address] [place] [address] Phone: [phone] Fax: [phone]   Patient reports  affordability concerns with their medications: {YES/NO:21197} Patient reports access/transportation concerns to their pharmacy: {YES/NO:21197} Patient reports adherence concerns with their medications:  {YES/NO:21197} ***  *** Patient denies adherence with medications, reports missing *** medications *** times per week, on average.  Hydralazine  filled 10/22/23 for 45 ds (not on  med list)    Hypertension:  Current medications: spironolactone  25 mg daily, continue telmisartan  20 mg daily, amlodipine  10 mg daily, and metoprolol  succinate 100 mg daily Medications previously tried:   Patient {HAS/DOES NOT YJCZ:65250} a validated, automated, upper arm home BP cuff Current blood pressure readings readings: ***  Patient {Actions; denies-reports:120008} hypotensive s/sx including ***dizziness, lightheadedness.  Patient {Actions; denies-reports:120008} hypertensive symptoms including ***headache, chest pain, shortness of breath  Current meal patterns: ***  Current physical activity: ***  Heart Failure (EF 60-65% in May 2021):  Current medications:  ACEi/ARB/ARNI: *** SGLT2i: *** Beta blocker: *** Mineralocorticoid Receptor Antagonist: *** Diuretic regimen: ***  Current home blood pressure readings: *** Current home weights: ***  Patient {Actions; denies-reports:120008} volume overload signs or symptoms including ***shortness of breath, lower extremity edema, increased use of pillows at night   Current physical activity: ***  Current medication access support: ***   Objective:  BP Readings from Last 3 Encounters:  10/29/23 (!) 139/91  10/14/23 (!) 143/91  10/07/23 (!) 146/90    Lab Results  Component Value Date   HGBA1C 5.6 07/27/2022   HGBA1C 5.5 12/16/2014   HGBA1C 5.7 01/06/2014       Latest Ref Rng & Units 10/29/2023   11:12 AM 10/07/2023    9:23 AM 09/30/2023   10:00 AM  BMP  Glucose 70 - 99 mg/dL 93  85  97   BUN 8 - 27 mg/dL 20  31  45   Creatinine 0.57 -  1.00 mg/dL 8.25  7.52  6.61   BUN/Creat Ratio 12 - 28 11  13  13    Sodium 134 - 144 mmol/L 135  136  135   Potassium 3.5 - 5.2 mmol/L 4.6  5.0  4.3   Chloride 96 - 106 mmol/L 102  101  95   CO2 20 - 29 mmol/L 17  20  21    Calcium  8.7 - 10.3 mg/dL 8.9  9.3  9.5     Lab Results  Component Value Date   CHOL 110 10/05/2022   HDL 36 (L) 10/05/2022   LDLCALC 55 10/05/2022   TRIG 100 10/05/2022   CHOLHDL 3.1 10/05/2022    Medications Reviewed Today   Medications were not reviewed in this encounter       Assessment/Plan:   Hypertension: - Currently {CHL Controlled/Uncontrolled:[id number]} with *** BP consistently {Desc; above/below:16086} goal {BP Goal:27557}. Patient {ACTION; IS/IS WNU:78978602} having s/sx of hypo- or hyper-tension. Medication adherence appears {Desc; appropriate/inappropriate:30686}. Control is suboptimal due to ***.  - Reviewed long term cardiovascular and renal outcomes of uncontrolled blood pressure - Reviewed appropriate blood pressure monitoring technique and reviewed goal blood pressure. Recommended to check home blood pressure and heart rate *** once daily and keep a log to bring to upcoming appointments - Recommend to ***     Heart Failure: - Currently {managed:26422} - Reviewed appropriate blood pressure monitoring technique and reviewed goal blood pressure - Reviewed to weigh daily and when to contact cardiology with weight gain - Reviewed dietary modifications including *** - Recommend to ***  - Meets financial criteria for *** patient assistance program through ***. Will collaborate with provider, CPhT, and patient to pursue assistance.    Medication Management: - Currently strategy {sufficient/insufficient:26424} to maintain appropriate adherence to prescribed medication regimen - Suggested use of weekly pill box to organize medications - Created list of medication, indication, and administration time. Provided to patient - Discussed  collaboration with local pharmacies for adherence packaging. Reviewed local pharmacies with adherence packaging options. Patient elects to ***    Written patient instructions provided. Patient verbalized understanding of treatment plan.   Follow Up Plan:  Pharmacist *** PCP clinic visit 11/13/23   Lorain Baseman, PharmD [facility] [phone]

## 2023-11-11 NOTE — Patient Outreach (Signed)
 Complex Care Management   Visit Note  11/11/2023  Name:  Vanessa Palmer MRN: [id number] DOB: 05/22/1950  Situation: Referral received for Complex Care Management related to Heart Failure and HTN I obtained verbal consent from Patient.  Visit completed with Patient  on the phone  Background:   Past Medical History:  Diagnosis Date   Acute renal failure superimposed on stage 3b chronic kidney disease, unspecified acute renal failure type (HCC) 09/09/2023   ALCOHOL ABUSE 12/13/2005   Annotation: Sober since 11/06 Qualifier: Diagnosis of  By: Elnor MD, Comer     Anemia    Arthritis    all over (02/21/2017)   CAD (coronary artery disease)    s/p non-Q wave MI in 06/2001 and 2004, 2005   CHF (congestive heart failure) (HCC)    Chronic kidney disease (CKD), stage III (moderate) (HCC) 09/19/2010   Chronic mid back pain    Depression    DJD (degenerative joint disease)    DVT (deep venous thrombosis) (HCC)    BLE   DVT of lower extremity, bilateral (HCC) 04/04/2012   On Xarelto      GERD (gastroesophageal reflux disease)    Gout    History of alcohol abuse 12/13/2005   Annotation: Sober since 11/06           Hyperlipidemia    Hypertension    INTRINSIC ASTHMA, WITH EXACERBATION 09/27/2009   Qualifier: Diagnosis of  By: Danella MD, Nodira     Migraine    none anymore (02/21/2017)   Myocardial infarction [facility]) ?2005   Obesity    OSA (obstructive sleep apnea)    previously used CPAP, has lost it (02/21/2017)   Pressure injury of skin 09/09/2023   Rhabdomyolysis 09/09/2023   Seizures (HCC)    As a teenager    Traumatic rhabdomyolysis (HCC) 09/09/2023    Assessment: Patient Reported Symptoms:  Cognitive Cognitive Status: Able to follow simple commands, Alert and oriented to person, place, and time, Normal speech and language skills Cognitive/Intellectual Conditions Management [RPT]: None reported or documented in medical history or problem list      Neurological  Neurological Review of Symptoms: No symptoms reported    HEENT HEENT Symptoms Reported: No symptoms reported      Cardiovascular Cardiovascular Symptoms Reported: Swelling in legs or feet (Patient reports swelling is improved) Does patient have uncontrolled Hypertension?: Yes Is patient checking Blood Pressure at home?: Yes Patient's Recent BP reading at home: 122/88, 135/90 Cardiovascular Management Strategies: Medical device, Medication therapy (Compression socks occasionally) Cardiovascular Comment: Patient notes she has not gotten a new scale yet.  Respiratory Respiratory Symptoms Reported: No symptoms reported Respiratory Management Strategies:  (Does not wear CPAP at nighttime. She previously used one but lost part of it when she moved, which was awhile ago per patient. She does not want to go through sleep testing again)  Endocrine Endocrine Symptoms Reported: No symptoms reported Is patient diabetic?: No    Gastrointestinal Gastrointestinal Symptoms Reported: Diarrhea, Nausea Additional Gastrointestinal Details: Patient reports she has the runs today, so she had to cancel office visit with pharmacist. This visit has been rescheduled. Patient reports diarrhea started last night and she has had about 5 episodes so far. Yesterday she ate a salad and steak and cheese sub. She reports she has had some episodes where she cannot get to the toilet in time and is wearing a brief. Patient reports nausea but no vomiting. She ate some cereal this morning. Advised keeping water intake up to prevent  dehydration and gradually introducing easy to digest foods.      Genitourinary Genitourinary Symptoms Reported: No symptoms reported    Integumentary Integumentary Symptoms Reported: No symptoms reported    Musculoskeletal Musculoskelatal Symptoms Reviewed: Back pain, Difficulty walking, Joint pain, Unsteady gait Additional Musculoskeletal Details: Patient reports continued pain in her left shoulder.  Denies pain at time of assessment Musculoskeletal Management Strategies: Exercise, Medical device, Adequate rest (Cane, walker, wheelchair) Musculoskeletal Comment: Patient reports HH PT and OT are no longer coming out. They have left exercises for her to do at home, which she reports compliance with. Falls in the past year?: Yes Number of falls in past year: 2 or more (No falls since previous CMRN visit) Was there an injury with Fall?: Yes Fall Risk Category Calculator: 3 Patient Fall Risk Level: High Fall Risk Patient at Risk for Falls Due to: Impaired balance/gait Fall risk Follow up: Falls evaluation completed, Education provided, Falls prevention discussed  Psychosocial Psychosocial Symptoms Reported: Depression - if selected complete PHQ 2-9 Additional Psychological Details: Patient feels that depression has stayed about the same Behavioral Management Strategies: Support system, Medication therapy Major Change/Loss/Stressor/Fears (CP): Death of a loved one Techniques to Cope with Loss/Stress/Change: Medication      11/11/2023    PHQ2-9 Depression Screening   Little interest or pleasure in doing things More than half the days  Feeling down, depressed, or hopeless More than half the days  PHQ-2 - Total Score 4  Trouble falling or staying asleep, or sleeping too much More than half the days  Feeling tired or having little energy More than half the days  Poor appetite or overeating  More than half the days  Feeling bad about yourself - or that you are a failure or have let yourself or your family down Not at all  Trouble concentrating on things, such as reading the newspaper or watching television Several days  Moving or speaking so slowly that other people could have noticed.  Or the opposite - being so fidgety or restless that you have been moving around a lot more than usual Not at all  Thoughts that you would be better off dead, or hurting yourself in some way Not at all  PHQ2-9  Total Score 11  If you checked off any problems, how difficult have these problems made it for you to do your work, take care of things at home, or get along with other people    Depression Interventions/Treatment Medication    There were no vitals filed for this visit.  Medications Reviewed Today     Reviewed by Arno Rosaline SQUIBB, RN (Registered Nurse) on 11/11/23 at 1546  Med List Status: <None>   Medication Order Taking? Sig Documenting Provider Last Dose Status Informant  amLODipine  (NORVASC ) 10 MG tablet [id number]  Take 1 tablet (10 mg total) by mouth daily. Briana Elgin LABOR, MD  Active   aspirin  EC 81 MG tablet [id number]  Take 1 tablet (81 mg total) by mouth daily. Swallow whole. Addie Perkins, DO  Active Pharmacy Records  atorvastatin  (LIPITOR ) 40 MG tablet [id number]  Take 1 tablet (40 mg total) by mouth daily. Addie Perkins, DO  Active Pharmacy Records           Med Note STEFFI, ADELITA   Tue Sep 10, 2023 12:36 PM)  Recent Dispenses     08/18/2023 40 MG TABS (disp 90, 90d supply) 06/11/2023 40 MG TABS (disp 90, 90d supply)     budesonide -formoterol  (SYMBICORT ) 160-4.5 MCG/ACT  inhaler [id number]  Inhale 2 puffs into the lungs 3 times/day as needed-between meals & bedtime. Leopold Damien NOVAK, MD  Active Pharmacy Records           Med Note STEFFI, ALEXANDRIA   Tue Sep 10, 2023 12:37 PM) No recent fills  cyclobenzaprine  (FLEXERIL ) 10 MG tablet [id number]  Take 1 tablet (10 mg total) by mouth 2 (two) times daily as needed for muscle spasms. Vernetta Lonni GRADE, MD  Active Pharmacy Records           Med Note STEFFI, ALEXANDRIA   Tue Sep 10, 2023 12:37 PM) Recent Dispenses    08/09/2023 10 MG TABS (disp 30, 15d supply) 05/30/2023 10 MG TABS (disp 30, 15d supply)    EPINEPHrine  0.3 mg/0.3 mL IJ SOAJ injection [id number]  Inject 0.3 mg into the muscle as needed for anaphylaxis. Leopold Damien NOVAK, MD  Active Pharmacy Records           Med Note [facility]   Mon Sep 09, 2023  9:30 PM)    feeding supplement (ENSURE PLUS HIGH PROTEIN) LIQD [id number]  Take 237 mLs by mouth 2 (two) times daily between meals.  Patient not taking: Reported on 10/21/2023   Briana Elgin LABOR, MD  Active   gabapentin  (NEURONTIN ) 300 MG capsule [id number]  Take 1 capsule (300 mg total) by mouth 2 (two) times daily. Briana Elgin LABOR, MD  Active   hydrOXYzine  (ATARAX ) 25 MG tablet [id number]  Take 25 mg by mouth every 6 (six) hours as needed for itching. [provider]  Active Pharmacy Records           Med Note STEFFI, ALEXANDRIA   Tue Sep 10, 2023 12:38 PM) No recent fills   metoprolol  succinate (TOPROL -XL) 100 MG 24 hr tablet [id number]  Take 1 tablet (100 mg total) by mouth daily. Take with or immediately following a meal. Briana Elgin LABOR, MD  Active   nitroGLYCERIN  (NITROSTAT ) 0.4 MG SL tablet [id number]  Place 1 tablet (0.4 mg total) under the tongue every 5 (five) minutes as needed for chest pain. [name], DO  Active Pharmacy Records  oxyCODONE -acetaminophen  (PERCOCET) 10-325 MG tablet [id number]  Take 1 tablet by mouth 2 (two) times daily as needed for pain. Take by mouth in the morning and afternoon (separated by at least 8 hours). Do not use at night. Briana Elgin LABOR, MD  Active   oxyCODONE -acetaminophen  (PERCOCET) 10-325 MG tablet [id number]  Take 1 tablet by mouth every 12 (twelve) hours as needed. [name], DO  Active   pantoprazole  (PROTONIX ) 40 MG tablet [id number]  Take 1 tablet (40 mg total) by mouth daily. [name], DO  Active Pharmacy Records           Med Note STEFFI NIAN   Tue Sep 10, 2023 12:39 PM) Recent Dispenses    08/27/2023 40 MG TBEC (disp 30, 30d supply) 01/31/2023 40 MG TBEC (disp 30, 30d supply)    rivaroxaban  (XARELTO ) 20 MG TABS tablet [id number]  TAKE ONE TABLET BY MOUTH DAILY WITH SUPPER Addie Damien, DO  Active Pharmacy Records           Med Note STEFFI, NIAN   Tue Sep 10, 2023 12:39 PM) Recent  Dispenses    06/11/2023 20 MG TABS (disp 90, 90d supply) 03/11/2023 20 MG TABS (disp 90, 90d supply)    sertraline  (ZOLOFT ) 100 MG tablet [id number]  Take 1 tablet (100 mg total) by mouth daily. Gregary,  Ozell, MD  Active Pharmacy Records           Med Note STEFFI, ADELITA   Tue Sep 10, 2023 12:39 PM) Recent Dispenses    08/18/2023 100 MG TABS (disp 90, 90d supply) 07/12/2023 100 MG TABS (disp 31, 31d supply)    spironolactone  (ALDACTONE ) 25 MG tablet [id number]  Take 1 tablet (25 mg total) by mouth daily. [name] Ozell, MD  Active   telmisartan  (MICARDIS ) 20 MG tablet [id number]  Take 1 tablet (20 mg total) by mouth daily. [name], DO  Active Pharmacy Records  tolterodine  (DETROL  LA) 2 MG 24 hr capsule [id number]  Take 1 capsule (2 mg total) by mouth daily. Addie Perkins, DO  Active Pharmacy Records           Med Note STEFFI, ALEXANDRIA   Tue Sep 10, 2023 12:41 PM)  Recent Dispenses     07/16/2023 2 MG CP24 (disp 30, 30d supply) 06/14/2023 2 MG CP24 (disp 30, 30d supply)     torsemide  (DEMADEX ) 20 MG tablet [id number]  Take 1 tablet (20 mg total) by mouth daily.  Patient not taking: Reported on 10/21/2023   [name], DO  Active Pharmacy Records           Med Note STEFFI, ADELITA   Tue Sep 10, 2023 12:41 PM) Recent Dispenses    08/27/2023 20 MG TABS (disp 90, 90d supply) 06/20/2023 20 MG TABS (disp 180, 90d supply) 09/27/2022 20 MG TABS (disp 180, 90d supply)              Recommendation:   Continue Current Plan of Care  Follow Up Plan:   Telephone follow up appointment date/time:  12/09/23 at 3 PM  Rosaline Finlay, RN MSN [place]  [facility] Population Health RN Care Manager Direct Dial: [phone]  Fax: [phone]

## 2023-11-11 NOTE — Patient Instructions (Signed)
 Visit Information  Thank you for taking time to visit with me today. Please don't hesitate to contact me if I can be of assistance to you before our next scheduled appointment.  Your next care management appointment is by telephone on 12/09/23 at 3 PM  Please call the care guide team at [phone] if you need to cancel, schedule, or reschedule an appointment.   Please call the Suicide and Crisis Lifeline: 988 call 1-800-273-TALK (toll free, 24 hour hotline) if you are experiencing a Mental Health or Behavioral Health Crisis or need someone to talk to.  Rosaline Finlay, RN MSN [place]  VBCI Population Health RN Care Manager Direct Dial: [phone]  Fax: [phone]

## 2023-11-11 NOTE — Telephone Encounter (Signed)
 Rescheduled for 11/28/23. Asked her to bring all her medications to her physician appt on 9/17 and to her pharmacy clinic appointment.   Patient was not able to convert to telephone appt due to not feeling well.  Lorain Baseman, PharmD [facility] [phone]

## 2023-11-12 ENCOUNTER — Other Ambulatory Visit: Payer: Self-pay | Admitting: *Deleted

## 2023-11-13 ENCOUNTER — Ambulatory Visit

## 2023-11-13 ENCOUNTER — Ambulatory Visit: Admitting: Student

## 2023-11-13 VITALS — Ht 62.0 in | Wt 239.0 lb

## 2023-11-13 VITALS — BP 113/79 | HR 83 | Temp 98.4°F | Ht 62.0 in | Wt 239.6 lb

## 2023-11-13 DIAGNOSIS — I10 Essential (primary) hypertension: Secondary | ICD-10-CM | POA: Diagnosis not present

## 2023-11-13 DIAGNOSIS — L859 Epidermal thickening, unspecified: Secondary | ICD-10-CM

## 2023-11-13 DIAGNOSIS — Z0000 Encounter for general adult medical examination without abnormal findings: Secondary | ICD-10-CM | POA: Diagnosis not present

## 2023-11-13 DIAGNOSIS — E785 Hyperlipidemia, unspecified: Secondary | ICD-10-CM

## 2023-11-13 NOTE — Patient Instructions (Signed)
 Thank you, [name] for allowing us  to provide your care today. Today we discussed blood pressure and the right foot pain that has resolved.   We have ordered a lipid panel to check your cholesterol, but this does not need done today.  Keep using lotion for the dry skin on your right foot.    Follow up: 3 months for chronic conditions     Should you have any questions or concerns please call the internal medicine clinic at [phone].     Please note that our late policy has changed.  If you are more than 15 minutes late to your appointment, you may be asked to reschedule your appointment.  Dr. [name], D.O. [facility]

## 2023-11-13 NOTE — Patient Instructions (Signed)
 Vanessa Palmer,  Thank you for taking the time for your Medicare Wellness Visit. I appreciate your continued commitment to your health goals. Please review the care plan we discussed, and feel free to reach out if I can assist you further.  Medicare recommends these wellness visits once per year to help you and your care team stay ahead of potential health issues. These visits are designed to focus on prevention, allowing your provider to concentrate on managing your acute and chronic conditions during your regular appointments.  Please note that Annual Wellness Visits do not include a physical exam. Some assessments may be limited, especially if the visit was conducted virtually. If needed, we may recommend a separate in-person follow-up with your provider.  Ongoing Care Seeing your primary care provider every 3 to 6 months helps us  monitor your health and provide consistent, personalized care.   Referrals If a referral was made during today's visit and you haven't received any updates within two weeks, please contact the referred provider directly to check on the status.  Recommended Screenings:  Health Maintenance  Topic Date Due   Pneumococcal Vaccine for age over 12 (1 of 2 - PCV) Never done   Zoster (Shingles) Vaccine (1 of 2) Never done   COVID-19 Vaccine (4 - 2025-26 season) 10/28/2023   Flu Shot  05/26/2024*   Breast Cancer Screening  11/12/2024*   Colon Cancer Screening  11/12/2024*   Medicare Annual Wellness Visit  11/12/2024   DTaP/Tdap/Td vaccine (2 - Td or Tdap) 03/25/2031   DEXA scan (bone density measurement)  Completed   Hepatitis C Screening  Completed   HPV Vaccine  Aged Out   Meningitis B Vaccine  Aged Out  *Topic was postponed. The date shown is not the original due date.       10/29/2023   10:28 AM  Advanced Directives  Does Patient Have a Medical Advance Directive? No  Would patient like information on creating a medical advance directive? No - Patient declined    Advance Care Planning is important because it: Ensures you receive medical care that aligns with your values, goals, and preferences. Provides guidance to your family and loved ones, reducing the emotional burden of decision-making during critical moments.  Vision: Annual vision screenings are recommended for early detection of glaucoma, cataracts, and diabetic retinopathy. These exams can also reveal signs of chronic conditions such as diabetes and high blood pressure.  Dental: Annual dental screenings help detect early signs of oral cancer, gum disease, and other conditions linked to overall health, including heart disease and diabetes.  Please see the attached documents for additional preventive care recommendations.   Healthy Eating, Adult Healthy eating may help you get and keep a healthy body weight, reduce the risk of chronic disease, and live a long and productive life. It is important to follow a healthy eating pattern. Your nutritional and calorie needs should be met mainly by different nutrient-rich foods. What are tips for following this plan? Reading food labels Read labels and choose the following: Reduced or low sodium products. Juices with 100% fruit juice. Foods with low saturated fats (<3 g per serving) and high polyunsaturated and monounsaturated fats. Foods with whole grains, such as whole wheat, cracked wheat, brown rice, and wild rice. Whole grains that are fortified with folic acid . This is recommended for females who are pregnant or who want to become pregnant. Read labels and do not eat or drink the following: Foods or drinks with added sugars. These include  foods that contain brown sugar, corn sweetener, corn syrup, dextrose , fructose, glucose, high-fructose corn syrup, honey, invert sugar, lactose, malt syrup, maltose, molasses, raw sugar, sucrose, trehalose, or turbinado sugar. Limit your intake of added sugars to less than 10% of your total daily calories. Do not  eat more than the following amounts of added sugar per day: 6 teaspoons (25 g) for females. 9 teaspoons (38 g) for males. Foods that contain processed or refined starches and grains. Refined grain products, such as white flour, degermed cornmeal, white bread, and white rice. Shopping Choose nutrient-rich snacks, such as vegetables, whole fruits, and nuts. Avoid high-calorie and high-sugar snacks, such as potato chips, fruit snacks, and candy. Use oil-based dressings and spreads on foods instead of solid fats such as butter, margarine, sour cream, or cream cheese. Limit pre-made sauces, mixes, and instant products such as flavored rice, instant noodles, and ready-made pasta. Try more plant-protein sources, such as tofu, tempeh, black beans, edamame, lentils, nuts, and seeds. Explore eating plans such as the Mediterranean diet or vegetarian diet. Try heart-healthy dips made with beans and healthy fats like hummus and guacamole. Vegetables go great with these. Cooking Use oil to saut or stir-fry foods instead of solid fats such as butter, margarine, or lard. Try baking, boiling, grilling, or broiling instead of frying. Remove the fatty part of meats before cooking. Steam vegetables in water or broth. Meal planning  At meals, imagine dividing your plate into fourths: One-half of your plate is fruits and vegetables. One-fourth of your plate is whole grains. One-fourth of your plate is protein, especially lean meats, poultry, eggs, tofu, beans, or nuts. Include low-fat dairy as part of your daily diet. Lifestyle Choose healthy options in all settings, including home, work, school, restaurants, or stores. Prepare your food safely: Wash your hands after handling raw meats. Where you prepare food, keep surfaces clean by regularly washing with hot, soapy water. Keep raw meats separate from ready-to-eat foods, such as fruits and vegetables. Cook seafood, meat, poultry, and eggs to the  recommended temperature. Get a food thermometer. Store foods at safe temperatures. In general: Keep cold foods at 64F (4.4C) or below. Keep hot foods at 164F (60C) or above. Keep your freezer at [facility] (-17.8C) or below. Foods are not safe to eat if they have been between the temperatures of 40-164F (4.4-60C) for more than 2 hours. What foods should I eat? Fruits Aim to eat 1-2 cups of fresh, canned (in natural juice), or frozen fruits each day. One cup of fruit equals 1 small apple, 1 large banana, 8 large strawberries, 1 cup (237 g) canned fruit,  cup (82 g) dried fruit, or 1 cup (240 mL) 100% juice. Vegetables Aim to eat 2-4 cups of fresh and frozen vegetables each day, including different varieties and colors. One cup of vegetables equals 1 cup (91 g) broccoli or cauliflower florets, 2 medium carrots, 2 cups (150 g) raw, leafy greens, 1 large tomato, 1 large bell pepper, 1 large sweet potato, or 1 medium white potato. Grains Aim to eat 5-10 ounce-equivalents of whole grains each day. Examples of 1 ounce-equivalent of grains include 1 slice of bread, 1 cup (40 g) ready-to-eat cereal, 3 cups (24 g) popcorn, or  cup (93 g) cooked rice. Meats and other proteins Try to eat 5-7 ounce-equivalents of protein each day. Examples of 1 ounce-equivalent of protein include 1 egg,  oz nuts (12 almonds, 24 pistachios, or 7 walnut halves), 1/4 cup (90 g) cooked beans, 6 tablespoons (  90 g) hummus or 1 tablespoon (16 g) peanut butter. A cut of meat or fish that is the size of a deck of cards is about 3-4 ounce-equivalents (85 g). Of the protein you eat each week, try to have at least 8 sounce (227 g) of seafood. This is about 2 servings per week. This includes salmon, trout, herring, sardines, and anchovies. Dairy Aim to eat 3 cup-equivalents of fat-free or low-fat dairy each day. Examples of 1 cup-equivalent of dairy include 1 cup (240 mL) milk, 8 ounces (250 g) yogurt, 1 ounces (44 g) natural cheese,  or 1 cup (240 mL) fortified soy milk. Fats and oils Aim for about 5 teaspoons (21 g) of fats and oils per day. Choose monounsaturated fats, such as canola and olive oils, mayonnaise made with olive oil or avocado oil, avocados, peanut butter, and most nuts, or polyunsaturated fats, such as sunflower, corn, and soybean oils, walnuts, pine nuts, sesame seeds, sunflower seeds, and flaxseed. Beverages Aim for 6 eight-ounce glasses of water per day. Limit coffee to 3-5 eight-ounce cups per day. Limit caffeinated beverages that have added calories, such as soda and energy drinks. If you drink alcohol: Limit how much you have to: 0-1 drink a day if you are female. 0-2 drinks a day if you are female. Know how much alcohol is in your drink. In the [place] one drink is one 12 oz bottle of beer (355 mL), one 5 oz glass of wine (148 mL), or one 1 oz glass of hard liquor (44 mL). Seasoning and other foods Try not to add too much salt to your food. Try using herbs and spices instead of salt. Try not to add sugar to food. This information is based on [place] nutrition guidelines. To learn more, visit [employer]. Exact amounts may vary. You may need different amounts. This information is not intended to replace advice given to you by your health care provider. Make sure you discuss any questions you have with your health care provider. Document Revised: 11/13/2021 Document Reviewed: 11/13/2021 [employer]vier Patient Education  2024 [employer].

## 2023-11-13 NOTE — Progress Notes (Signed)
 Subjective:   Vanessa Palmer is a 73 y.o. female who presents for Medicare Annual (Subsequent) preventive examination.  Visit Complete: Virtual I connected with  Shacoya D Romanski on 11/13/23 by a audio enabled telemedicine application and verified that I am speaking with the correct person using two identifiers.  Patient Location: Home  Provider Location: Home Office  I discussed the limitations of evaluation and management by telemedicine. The patient expressed understanding and agreed to proceed.  Vital Signs: Because this visit was a virtual/telehealth visit, some criteria may be missing or patient reported. Any vitals not documented were not able to be obtained and vitals that have been documented are patient reported.    Cardiac Risk Factors include: advanced age (>36men, >68 women);dyslipidemia;obesity (BMI >30kg/m2);sedentary lifestyle;family history of premature cardiovascular disease     Objective:    Today's Vitals   11/13/23 1542 11/13/23 1543  Weight: 239 lb (108.4 kg)   Height: 5' 2 (1.575 m)   PainSc:  5    Body mass index is 43.71 kg/m.     10/29/2023   10:28 AM 09/30/2023    9:27 AM 09/09/2023    4:40 PM 09/08/2023    8:53 PM 08/26/2023   10:58 AM 06/04/2023    5:42 PM 04/19/2023   10:45 AM  Advanced Directives  Does Patient Have a Medical Advance Directive? No No  No No No No  Would patient like information on creating a medical advance directive? No - Patient declined No - Patient declined No - Patient declined  No - Patient declined  No - Patient declined    Current Medications (verified) Outpatient Encounter Medications as of 11/13/2023  Medication Sig   amLODipine  (NORVASC ) 10 MG tablet Take 1 tablet (10 mg total) by mouth daily.   aspirin  EC 81 MG tablet Take 1 tablet (81 mg total) by mouth daily. Swallow whole.   atorvastatin  (LIPITOR ) 40 MG tablet Take 1 tablet (40 mg total) by mouth daily.   budesonide -formoterol  (SYMBICORT ) 160-4.5 MCG/ACT inhaler  Inhale 2 puffs into the lungs 3 times/day as needed-between meals & bedtime.   cyclobenzaprine  (FLEXERIL ) 10 MG tablet Take 1 tablet (10 mg total) by mouth 2 (two) times daily as needed for muscle spasms.   EPINEPHrine  0.3 mg/0.3 mL IJ SOAJ injection Inject 0.3 mg into the muscle as needed for anaphylaxis.   feeding supplement (ENSURE PLUS HIGH PROTEIN) LIQD Take 237 mLs by mouth 2 (two) times daily between meals.   gabapentin  (NEURONTIN ) 300 MG capsule Take 1 capsule (300 mg total) by mouth 2 (two) times daily.   hydrOXYzine  (ATARAX ) 25 MG tablet Take 25 mg by mouth every 6 (six) hours as needed for itching.   metoprolol  succinate (TOPROL -XL) 100 MG 24 hr tablet Take 1 tablet (100 mg total) by mouth daily. Take with or immediately following a meal.   nitroGLYCERIN  (NITROSTAT ) 0.4 MG SL tablet Place 1 tablet (0.4 mg total) under the tongue every 5 (five) minutes as needed for chest pain.   oxyCODONE -acetaminophen  (PERCOCET) 10-325 MG tablet Take 1 tablet by mouth 2 (two) times daily as needed for pain. Take by mouth in the morning and afternoon (separated by at least 8 hours). Do not use at night.   oxyCODONE -acetaminophen  (PERCOCET) 10-325 MG tablet Take 1 tablet by mouth every 12 (twelve) hours as needed.   pantoprazole  (PROTONIX ) 40 MG tablet Take 1 tablet (40 mg total) by mouth daily.   rivaroxaban  (XARELTO ) 20 MG TABS tablet TAKE ONE TABLET BY MOUTH  DAILY WITH SUPPER   sertraline  (ZOLOFT ) 100 MG tablet Take 1 tablet (100 mg total) by mouth daily.   spironolactone  (ALDACTONE ) 25 MG tablet Take 1 tablet (25 mg total) by mouth daily.   telmisartan  (MICARDIS ) 20 MG tablet Take 1 tablet (20 mg total) by mouth daily.   tolterodine  (DETROL  LA) 2 MG 24 hr capsule Take 1 capsule (2 mg total) by mouth daily.   torsemide  (DEMADEX ) 20 MG tablet Take 1 tablet (20 mg total) by mouth daily.   No facility-administered encounter medications on file as of 11/13/2023.    Allergies (verified) Ace inhibitors,  Other, Regadenoson , Shellfish allergy, Peanut oil, Hydrocodone -acetaminophen , Hydromorphone  hcl, Oxycodone -acetaminophen , and Propoxyphene n-acetaminophen    History: Past Medical History:  Diagnosis Date   Acute renal failure superimposed on stage 3b chronic kidney disease, unspecified acute renal failure type (HCC) 09/09/2023   ALCOHOL ABUSE 12/13/2005   Annotation: Sober since 11/06 Qualifier: Diagnosis of  By: Elnor MD, Comer     Anemia    Arthritis    all over (02/21/2017)   CAD (coronary artery disease)    s/p non-Q wave MI in 06/2001 and 2004, 2005   CHF (congestive heart failure) (HCC)    Chronic kidney disease (CKD), stage III (moderate) (HCC) 09/19/2010   Chronic mid back pain    Depression    DJD (degenerative joint disease)    DVT (deep venous thrombosis) (HCC)    BLE   DVT of lower extremity, bilateral (HCC) 04/04/2012   On Xarelto      GERD (gastroesophageal reflux disease)    Gout    History of alcohol abuse 12/13/2005   Annotation: Sober since 11/06           Hyperlipidemia    Hypertension    INTRINSIC ASTHMA, WITH EXACERBATION 09/27/2009   Qualifier: Diagnosis of  By: Danella MD, Nodira     Migraine    none anymore (02/21/2017)   [facility]) ?2005   Obesity    OSA (obstructive sleep apnea)    previously used CPAP, has lost it (02/21/2017)   Pressure injury of skin 09/09/2023   Rhabdomyolysis 09/09/2023   Seizures (HCC)    As a teenager    Traumatic rhabdomyolysis (HCC) 09/09/2023   Past Surgical History:  Procedure Laterality Date   ABDOMINAL HYSTERECTOMY     partial; due to endometriosis   CARDIAC CATHETERIZATION  06/2001, 02/2002, 10/2004    (10-15% proximal stenosis of circumflex, diffuse disease of  OM1 and RCA)   CARDIAC CATHETERIZATION  02/21/2017   COLONOSCOPY     JOINT REPLACEMENT     KNEE ARTHROSCOPY Left    LEFT HEART CATH AND CORONARY ANGIOGRAPHY N/A 02/21/2017   Procedure: LEFT HEART CATH AND CORONARY ANGIOGRAPHY;   Surgeon: Levern Hutching, MD;  Location: [facility] INVASIVE [facility];  Service: Cardiovascular;  Laterality: N/A;   TOTAL HIP ARTHROPLASTY Right 11/23/2014   Procedure: RIGHT TOTAL HIP ARTHROPLASTY ANTERIOR APPROACH;  Surgeon: Lonni CINDERELLA Poli, MD;  Location: [facility] OR;  Service: Orthopedics;  Laterality: Right;   TUBAL LIGATION     Family History  Problem Relation Age of Onset   Mental illness Mother    Heart disease Father    Diabetes Sister    Heart disease Sister    Diabetes Sister    Mental illness Sister    Heart disease Brother    Hypertension Daughter    Heart disease Daughter    Social History   Socioeconomic History   Marital status: Widowed  Spouse name: Not on file   Number of children: 3   Years of education: GED   Highest education level: Not on file  Occupational History   Occupation: retired    Associate Professor: UNEMPLOYED    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   Drug use: No   Sexual activity: Not on file  Other Topics Concern   Not on file  Social History Narrative   Current Social History 02/01/2020        Patient lives alone in a/an apartment which is 1 story. There are not steps up to the entrance the patient uses.       Patient's method of transportation is via family member and the city bus.      The highest level of education was GED school diploma.      The patient currently disabled.      Identified important Relationships are Granddaughter       Pets : None       Interests / Fun: I like to Crochet       Current Stressors: My best friend passed away in Dec 24, 2023       Social Drivers of Health   Financial Resource Strain: Low Risk  (11/13/2023)    Overall Financial Resource Strain (CARDIA)    Difficulty of Paying Living Expenses: Not hard at all  Food Insecurity: No Food Insecurity (11/13/2023)   Hunger Vital Sign    Worried About Running Out of Food in the Last Year: Never true    Ran Out of Food in the Last Year: Never true  Transportation Needs: No Transportation Needs (11/13/2023)   PRAPARE - Administrator, Civil Service (Medical): No    Lack of Transportation (Non-Medical): No  Physical Activity: Inactive (11/13/2023)   Exercise Vital Sign    Days of Exercise per Week: 0 days    Minutes of Exercise per Session: 0 min  Stress: No Stress Concern Present (11/13/2023)   [employer] of Occupational Health - Occupational Stress Questionnaire    Feeling of Stress: Only a little  Social Connections: Moderately Integrated (11/13/2023)   Social Connection and Isolation Panel    Frequency of Communication with Friends and Family: More than three times a week    Frequency of Social Gatherings with Friends and Family: More than three times a week    Attends Religious Services: More than 4 times per year    Active Member of [employer] or Organizations: Yes    Attends Engineer, structural: More than 4 times per year    Marital Status: Divorced    Tobacco Counseling Counseling given: Not Answered   Clinical Intake:  Pre-visit preparation completed: Yes  Pain : 0-10 Pain Score: 5  Pain Type: Chronic pain Pain Location: Back Pain Orientation: Lower Pain Onset: More than a month ago     BMI - recorded: 43.82 Nutritional Status: BMI > 30  Obese Nutritional Risks: Unintentional weight loss, Unintentional weight gain, Nausea/ vomitting/ diarrhea Diabetes: No  How often do you need to have someone help you when you read instructions, pamphlets, or other written materials from your  doctor or pharmacy?: 2 - Rarely What is the last grade level you completed in school?: GED  Interpreter Needed?: No  Information  entered by :: Arnette Hoots, CMA   Activities of Daily Living    11/13/2023    3:47 PM 09/09/2023    5:05 PM  In your present state of health, do you have any difficulty performing the following activities:  Hearing? 1   Vision? 1   Difficulty concentrating or making decisions? 1   Walking or climbing stairs? 1   Dressing or bathing? 1   Doing errands, shopping? 1 1  Preparing Food and eating ? Y   Using the Toilet? Y   In the past six months, have you accidently leaked urine? Y   Do you have problems with loss of bowel control? N   Managing your Medications? N   Managing your Finances? N   Housekeeping or managing your Housekeeping? Y     Patient Care Team: [name], DO as PCP - General (Internal Medicine) Arno Rosaline SQUIBB, RN as Registered Nurse  Indicate any recent Medical Services you may have received from other than [facility] providers in the past year (date may be approximate).     Assessment:   This is a routine wellness examination for Vanessa Palmer.  Hearing/Vision screen No results found.   Goals Addressed             This Visit's Progress    Patient Stated       Eating healthy       Depression Screen    11/13/2023    3:50 PM 11/11/2023    3:45 PM 10/29/2023   10:25 AM 10/21/2023    4:15 PM 09/18/2023    3:49 PM 06/11/2023    2:27 PM 04/19/2023   10:41 AM  PHQ 2/9 Scores  PHQ - 2 Score 2 4 4 5 4  5   PHQ- 9 Score 9 11  13 14  16   Exception Documentation      Patient refusal     Fall Risk    11/11/2023    3:41 PM 10/29/2023   10:25 AM 10/21/2023    4:04 PM 10/14/2023    2:01 PM 10/07/2023    8:20 AM  Fall Risk   Falls in the past year? 1 1 1 1 1   Number falls in past yr: 1 1 1 1 1   Comment No falls since previous [facility] visit  No falls since previous [facility] visit    Injury with Fall? 1 1 1 1 1   Risk for fall due to : Impaired balance/gait Impaired balance/gait History of fall(s);Impaired balance/gait History of fall(s);Impaired balance/gait;Impaired  mobility History of fall(s);Impaired balance/gait;Impaired mobility  Follow up Falls evaluation completed;Education provided;Falls prevention discussed Falls evaluation completed Falls evaluation completed;Education provided;Falls prevention discussed Falls evaluation completed;Falls prevention discussed Falls evaluation completed;Falls prevention discussed    MEDICARE RISK AT HOME:    TIMED UP AND GO:  Was the test performed?  No    Cognitive Function:        Immunizations Immunization History  Administered Date(s) Administered   Fluad Quad(high Dose 65+) 12/09/2020   Fluad Trivalent(High Dose 65+) 11/14/2022   Influenza,inj,Quad PF,6+ Mos 10/30/2018, 12/11/2019   Moderna Sars-Covid-2 Vaccination 08/06/2019, 09/17/2019, 04/16/2020   Tdap 03/24/2021    TDAP status: Up to date  Flu Vaccine status: Due, Education has been provided regarding the importance of this vaccine. Advised may receive this vaccine at local pharmacy or Health Dept. Aware  to provide a copy of the vaccination record if obtained from local pharmacy or Health Dept. Verbalized acceptance and understanding.  Pneumococcal vaccine status: Due, Education has been provided regarding the importance of this vaccine. Advised may receive this vaccine at local pharmacy or Health Dept. Aware to provide a copy of the vaccination record if obtained from local pharmacy or Health Dept. Verbalized acceptance and understanding.  Covid-19 vaccine status: Information provided on how to obtain vaccines.   Qualifies for Shingles Vaccine? Yes   Zostavax completed No   Shingrix Completed?: No.    Education has been provided regarding the importance of this vaccine. Patient has been advised to call insurance company to determine out of pocket expense if they have not yet received this vaccine. Advised may also receive vaccine at local pharmacy or Health Dept. Verbalized acceptance and understanding.  Screening Tests Health Maintenance   Topic Date Due   Pneumococcal Vaccine: 50+ Years (1 of 2 - PCV) Never done   Zoster Vaccines- Shingrix (1 of 2) Never done   Medicare Annual Wellness (AWV)  07/27/2023   COVID-19 Vaccine (4 - 2025-26 season) 10/28/2023   Influenza Vaccine  05/26/2024 (Originally 09/27/2023)   Mammogram  11/12/2024 (Originally 09/28/2023)   Colonoscopy  11/12/2024 (Originally 08/19/2020)   DTaP/Tdap/Td (2 - Td or Tdap) 03/25/2031   DEXA SCAN  Completed   Hepatitis C Screening  Completed   HPV VACCINES  Aged Out   Meningococcal B Vaccine  Aged Out    Health Maintenance  Health Maintenance Due  Topic Date Due   Pneumococcal Vaccine: 50+ Years (1 of 2 - PCV) Never done   Zoster Vaccines- Shingrix (1 of 2) Never done   Medicare Annual Wellness (AWV)  07/27/2023   COVID-19 Vaccine (4 - 2025-26 season) 10/28/2023    Colorectal cancer screening: Type of screening: Colonoscopy. Completed 2012. Repeat every 10 years. Patient declines  Mammogram status: Completed 2023. Repeat every year  Bone Density status: Completed 2021. Results reflect: Bone density results: NORMAL. Repeat every 2 years.  Lung Cancer Screening: (Low Dose CT Chest recommended if Age 56-80 years, 20 pack-year currently smoking OR have quit w/in 15years.) does not qualify.   Lung Cancer Screening Referral: n/a  Additional Screening:  Hepatitis C Screening: does qualify; Completed 2012  Vision Screening: Recommended annual ophthalmology exams for early detection of glaucoma and other disorders of the eye. Is the patient up to date with their annual eye exam?  No  Who is the provider or what is the name of the office in which the patient attends annual eye exams? She cannot remember name but is calling to schedule due to glasses being too big.  If pt is not established with a provider, would they like to be referred to a provider to establish care? No .   Dental Screening: Recommended annual dental exams for proper oral  hygiene   Community Resource Referral / Chronic Care Management: CRR required this visit?  No   CCM required this visit?  No     Plan:     I have personally reviewed and noted the following in the patient's chart:   Medical and social history Use of alcohol, tobacco or illicit drugs  Current medications and supplements including opioid prescriptions. Patient is not currently taking opioid prescriptions. Functional ability and status Nutritional status Physical activity Advanced directives List of other physicians Hospitalizations, surgeries, and ER visits in previous 12 months Vitals Screenings to include cognitive, depression, and falls Referrals and  appointments  In addition, I have reviewed and discussed with patient certain preventive protocols, quality metrics, and best practice recommendations. A written personalized care plan for preventive services as well as general preventive health recommendations were provided to patient.     Arnette LOISE Hoots, CMA   11/13/2023   After Visit Summary: (Mail) Due to this being a telephonic visit, the after visit summary with patients personalized plan was offered to patient via mail   Nurse Notes: Patient will discuss vaccines with provider at next visit. She is willing to do mammogram if they can work it for her.

## 2023-11-13 NOTE — Progress Notes (Signed)
 Established Patient Office Visit  Subjective   Patient ID: Vanessa Palmer, female    DOB: [birth year]  Age: 73 y.o. MRN: [id number]  Chief Complaint  Patient presents with   Follow-up    Patient states she is here for follow up from last office visit    Vanessa Palmer is a 73 y.o. who presents to the clinic for follow up of HTN and hyperkeratosis of the right heel. Please see problem based assessment and plan for additional details.   Patient Active Problem List   Diagnosis Date Noted   Skin hyperkeratosis 10/29/2023   Chronic heart failure with preserved ejection fraction (HCC) 10/07/2023   Bilateral hearing loss 08/16/2023   Lymphedema 07/27/2022   Vertebrogenic low back pain 07/27/2022   Shellfish allergy 03/19/2018   OA (osteoarthritis) of bilateral knees 04/29/2017   Chronic stable angina (HCC) 02/21/2017   LVH (left ventricular hypertrophy) 08/15/2016   Osteoarthritis of right hip 10/13/2014   Hypercoagulable state (HCC) 04/04/2012   Encounter for screening involving social determinants of health (SDoH) 01/14/2012   GERD (gastroesophageal reflux disease) 01/09/2012   Urinary incontinence 12/31/2011   Chronic kidney disease (CKD), stage III (moderate) (HCC) 09/19/2010   Asthma, chronic 09/27/2009   Elevated alkaline phosphatase level 08/01/2009   Major depressive disorder, recurrent episode, moderate (HCC) 09/08/2008   Gout 07/05/2008   Hyperlipidemia 05/07/2006   Coronary atherosclerosis 02/05/2006   Morbid obesity (HCC) 12/13/2005   SLEEP APNEA, OBSTRUCTIVE 12/13/2005   Essential hypertension 12/13/2005       Objective:     BP 113/79 (BP Location: Left Arm, Patient Position: Sitting, Cuff Size: Large)   Pulse 83   Temp 98.4 F (36.9 C) (Oral)   Ht 5' 2 (1.575 m)   Wt 239 lb 9.6 oz (108.7 kg)   SpO2 96%   BMI 43.82 kg/m  BP Readings from Last 3 Encounters:  11/13/23 113/79  10/29/23 (!) 139/91  10/14/23 (!) 143/91   Wt Readings from Last 3  Encounters:  11/13/23 239 lb (108.4 kg)  11/13/23 239 lb 9.6 oz (108.7 kg)  10/29/23 250 lb 6.4 oz (113.6 kg)      Physical Exam Vitals reviewed.  Constitutional:      General: She is not in acute distress.    Appearance: She is morbidly obese. She is not toxic-appearing.     Comments: Patient's aide is present in the room.  Cardiovascular:     Rate and Rhythm: Normal rate and regular rhythm.     Heart sounds: No murmur heard. Pulmonary:     Effort: Pulmonary effort is normal. No respiratory distress.     Breath sounds: Normal breath sounds. No wheezing.  Musculoskeletal:     Right lower leg: No edema.     Left lower leg: No edema.     Comments: Right foot hyperkeratosis has greatly improved, no pain during palpation of the right calcaneal region  Skin:    General: Skin is warm and dry.  Neurological:     Mental Status: She is alert.  Psychiatric:        Mood and Affect: Mood and affect normal.      Last metabolic panel Lab Results  Component Value Date   GLUCOSE 93 10/29/2023   NA 135 10/29/2023   K 4.6 10/29/2023   CL 102 10/29/2023   CO2 17 (L) 10/29/2023   BUN 20 10/29/2023   CREATININE 1.74 (H) 10/29/2023   EGFR 31 (L) 10/29/2023   CALCIUM  8.9  10/29/2023   PHOS 4.5 09/09/2023   PROT 6.5 09/11/2023   ALBUMIN  2.8 (L) 09/11/2023   LABGLOB 3.8 10/05/2022   AGRATIO 1.0 (L) 07/21/2021   BILITOT 1.4 (H) 09/11/2023   ALKPHOS 78 09/11/2023   AST 58 (H) 09/11/2023   ALT 24 09/11/2023   ANIONGAP 9 09/11/2023   Last lipids Lab Results  Component Value Date   CHOL 110 10/05/2022   HDL 36 (L) 10/05/2022   LDLCALC 55 10/05/2022   TRIG 100 10/05/2022   CHOLHDL 3.1 10/05/2022   Last hemoglobin A1c Lab Results  Component Value Date   HGBA1C 5.6 07/27/2022      The ASCVD Risk score (Arnett DK, et al., 2019) failed to calculate for the following reasons:   Risk score cannot be calculated because patient has a medical history suggesting prior/existing  ASCVD    Assessment & Plan:   Problem List Items Addressed This Visit       Cardiovascular and Mediastinum   Essential hypertension (Chronic)   Patient presents with a history of hypertension with a blood pressure today of 113/79. Their hypertension is controlled on a regimen of amlodipine  10 mg, Metoprolol  100 mg XL, spironolactone  25 mg daily, telmisartan  20 mg daily.  Prior BMP in September 2025, Scr was 1.74 (stable).   Plan: -Continue current regimen of: amlodipine  10 mg, Metoprolol  100 mg XL, spironolactone  25 mg daily, telmisartan  20 mg daily         Other   Hyperlipidemia (Chronic)   Last LDL was 55 in 2024.  Patient is on a regimen of Lipitor  40 mg daily.  Future lab order for lipid profile was placed for patient to have done when she has additional lab work ordered.      Relevant Orders   Lipid Profile   Skin hyperkeratosis - Primary   Patient's hyperkeratosis of the right heel has greatly improved.  She denies pain at this time.  She reports using lotion at home. Plan: - Patient was encouraged to continue using lotion to prevent reoccurrence of the hyperkeratosis of the right       Return in about 3 months (around 02/12/2024).    Damien Lease, DO

## 2023-11-13 NOTE — Assessment & Plan Note (Signed)
 Patient presents with a history of hypertension with a blood pressure today of 113/79. Their hypertension is controlled on a regimen of amlodipine  10 mg, Metoprolol  100 mg XL, spironolactone  25 mg daily, telmisartan  20 mg daily.  Prior BMP in September 2025, Scr was 1.74 (stable).   Plan: -Continue current regimen of: amlodipine  10 mg, Metoprolol  100 mg XL, spironolactone  25 mg daily, telmisartan  20 mg daily

## 2023-11-13 NOTE — Assessment & Plan Note (Signed)
 Last LDL was 55 in 2024.  Patient is on a regimen of Lipitor  40 mg daily.  Future lab order for lipid profile was placed for patient to have done when she has additional lab work ordered.

## 2023-11-13 NOTE — Assessment & Plan Note (Signed)
 Patient's hyperkeratosis of the right heel has greatly improved.  She denies pain at this time.  She reports using lotion at home. Plan: - Patient was encouraged to continue using lotion to prevent reoccurrence of the hyperkeratosis of the right

## 2023-11-16 ENCOUNTER — Other Ambulatory Visit: Payer: Self-pay | Admitting: Internal Medicine

## 2023-11-22 NOTE — Progress Notes (Signed)
 Internal Medicine Clinic Attending  Case discussed with the resident at the time of the visit.  We reviewed the resident's history and exam and pertinent patient test results.  I agree with the assessment, diagnosis, and plan of care documented in the resident's note.

## 2023-11-28 ENCOUNTER — Telehealth: Payer: Self-pay | Admitting: *Deleted

## 2023-11-28 ENCOUNTER — Ambulatory Visit

## 2023-11-28 VITALS — BP 128/83 | HR 76 | Wt 244.4 lb

## 2023-11-28 DIAGNOSIS — E785 Hyperlipidemia, unspecified: Secondary | ICD-10-CM

## 2023-11-28 DIAGNOSIS — Z79899 Other long term (current) drug therapy: Secondary | ICD-10-CM | POA: Diagnosis not present

## 2023-11-28 DIAGNOSIS — Z91013 Allergy to seafood: Secondary | ICD-10-CM | POA: Diagnosis not present

## 2023-11-28 DIAGNOSIS — I5030 Unspecified diastolic (congestive) heart failure: Secondary | ICD-10-CM

## 2023-11-28 DIAGNOSIS — K219 Gastro-esophageal reflux disease without esophagitis: Secondary | ICD-10-CM

## 2023-11-28 DIAGNOSIS — Z888 Allergy status to other drugs, medicaments and biological substances status: Secondary | ICD-10-CM

## 2023-11-28 NOTE — Telephone Encounter (Signed)
 Copied from CRM [id number]. Topic: Clinical - Home Health Verbal Orders >> Nov 28, 2023  4:00 PM Miquel SAILOR wrote: Caller/Agency: Will with [facility]  Callback Number: [phone] Service Requested: PT Evaluation Request  Any new concerns about the patient? No

## 2023-11-28 NOTE — Progress Notes (Signed)
 11/28/2023 Name: Vanessa Palmer MRN: [id number] DOB: [birth year]  Chief Complaint  Patient presents with   Hypertension   Medication Adherence    Vanessa Palmer is a 73 y.o. year old female who was referred for medication management by their primary care provider, Vanessa Dayton BROCKS, DO. They presented for a face to face visit today.   They were referred to the pharmacist by their PCP for assistance in managing hypertension . PMH includes HTN, chronic stable angina, asthma, HFpEF with LVH (EF 60-65% in 2021, with G1DD), OSA, asthma, GERD, hearing loss, CKD3, HLD, depression, bilateral DVT in 2014 (on lifelong anticoag).   Subjective: Patient was seen by PCP, Vanessa Palmer, on 10/14/23. At last visit, BP was 143/91 mmHg, HR 80 bpm. She was instructed to start spironolactone  25 mg daily, continue telmisartan  20 mg daily, amlodipine  10 mg daily, and metoprolol  succinate 100 mg daily. She was instructed to start taking daily weights to guide adjustment in diuretic (torsemide ) dose. On repeat BMP on 10/29/23 (after appt with Vanessa Palmer), Scr improved from 2.47 > 1.74 mg/dL (eGFR 20 > 31 mL/min) after starting spironolactone . K decreased from 5.0 > 4.6 mE/L. We have rescheduled her pharmacy appt multiple times. She has since seen Vanessa Palmer on 11/13/23. BP was improved at 113/79 mmH, HR 83 bpm. No changes were made to her regimen.  Today, patient presents in  good spirits. She is accompanied by her daughter, Vanessa Palmer. She is in a wheelchair today but able to stand to get a current weight. She reports she has had a stomach bug with n/v and diarrhea last week, but she has been feeling better for 2-3 days. She did not bring her medications with her today. She reports that she did take her medications this morning. She reports that she needs a new epi pen (states that she used hers last month when her eye had swollen shut). She is not sure what caused the reaction - thinks she may have ingested a food allergen  (peanut ?). Denied any ShOB, lip, or throat swelling with this episode. She also requests refill of pantoprazole .    Care Team: Primary Care Provider: need to schedule ~02/12/24  Medication Access/Adherence  Current Pharmacy:  [id number] - [place], [place] - [address] AT [facility] OF [address] [place] [address] Phone: [phone] Fax: [phone]  [id number] - [place], [place] - [address] AT [facility] [address] [place] [address] Phone: [phone] Fax: [phone]  [place] - [facility] [address] [place] [address] Phone: [phone] Fax: [phone]   Patient reports affordability concerns with their medications: No  Patient reports access/transportation concerns to their pharmacy: No  - someone picks up her medications for her from [facility]. Offered to switch her to mail order pharmacy today - she declined today, but said she would consider it.  Patient reports adherence concerns with their medications:  Yes  - not taking Xarelto  with food. Denies missed doses of other medications.  Heart Failure/HTN (EF 60-65% in May 2021):  Current medications:  ACEi/ARB/ARNI: telmisartan  20 mg daily (patient has angioedema to ACEi documented in allergy list, but has apparently been able to tolerate ARBs including losartan  and telmisartan  - however, she did report needing her epi pen recently for her eye being swollen a couple weeks ago - this event has not recurred) SGLT2i: none - urinary  incontinence, high risk for GU infections Beta blocker: metoprolol  succinate 100 mg daily Mineralocorticoid Receptor Antagonist: spironolactone  25 mg daily Diuretic regimen: torsemide  20 mg daily Other BP medications: amlodipine  10 mg daily  Current home blood pressure readings: - has a BP cuff but is not checking at home and unable to report  readings today Current home weights: not checking  Patient denies volume overload signs or symptoms including shortness of breath, lower extremity edema. She does report occasional burning chest pain, which she thinks could be acid reflux. She has not used NTG recently.  Current meal patterns:  Reports she does not add salt to foods Foods mostly prepared at home.  Current medication access support: [employer] Dual Complete  Medication Management:  Current adherence strategy: patient takes medications out of the bottles  Patient reports Good adherence to medications  Patient reports the following barriers to adherence: not understanding what medications are for  Objective:  BP Readings from Last 3 Encounters:  11/28/23 128/83  11/13/23 113/79  10/29/23 (!) 139/91    Lab Results  Component Value Date   HGBA1C 5.6 07/27/2022   HGBA1C 5.5 12/16/2014   HGBA1C 5.7 01/06/2014       Latest Ref Rng & Units 10/29/2023   11:12 AM 10/07/2023    9:23 AM 09/30/2023   10:00 AM  BMP  Glucose 70 - 99 mg/dL 93  85  97   BUN 8 - 27 mg/dL 20  31  45   Creatinine 0.57 - 1.00 mg/dL 8.25  7.52  6.61   BUN/Creat Ratio 12 - 28 11  13  13    Sodium 134 - 144 mmol/L 135  136  135   Potassium 3.5 - 5.2 mmol/L 4.6  5.0  4.3   Chloride 96 - 106 mmol/L 102  101  95   CO2 20 - 29 mmol/L 17  20  21    Calcium  8.7 - 10.3 mg/dL 8.9  9.3  9.5     Lab Results  Component Value Date   CHOL 110 10/05/2022   HDL 36 (L) 10/05/2022   LDLCALC 55 10/05/2022   TRIG 100 10/05/2022   CHOLHDL 3.1 10/05/2022    Medications Reviewed Today     Reviewed by Vanessa Palmer, RPH (Pharmacist) on 11/28/23 at 1711  Med List Status: <None>   Medication Order Taking? Sig Documenting Provider Last Dose Status Informant  amLODipine  (NORVASC ) 10 MG tablet [id number] Yes Take 1 tablet (10 mg total) by mouth daily. Vanessa Elgin LABOR, MD  Active   aspirin  EC 81 MG tablet [id number]  Take 1 tablet (81 mg total) by mouth daily. Swallow  whole. Vanessa Perkins, DO  Active Pharmacy Records  atorvastatin  (LIPITOR ) 40 MG tablet [id number] Yes Take 1 tablet (40 mg total) by mouth daily. Vanessa Perkins, DO  Active Pharmacy Records           Med Note STEFFI, ADELITA   Tue Sep 10, 2023 12:36 PM)  Recent Dispenses     08/18/2023 40 MG TABS (disp 90, 90d supply) 06/11/2023 40 MG TABS (disp 90, 90d supply)     budesonide -formoterol  (SYMBICORT ) 160-4.5 MCG/ACT inhaler [id number]  Inhale 2 puffs into the lungs 3 times/day as needed-between meals & bedtime. Leopold Palmer NOVAK, MD  Active Pharmacy Records           Med Note STEFFI, ALEXANDRIA   Tue Sep 10, 2023 12:37 PM) No recent fills  cyclobenzaprine  (FLEXERIL ) 10 MG tablet [id number]  Take 1 tablet (10  mg total) by mouth 2 (two) times daily as needed for muscle spasms. Vernetta Lonni GRADE, MD  Active Pharmacy Records           Med Note STEFFI, ALEXANDRIA   Tue Sep 10, 2023 12:37 PM) Recent Dispenses    08/09/2023 10 MG TABS (disp 30, 15d supply) 05/30/2023 10 MG TABS (disp 30, 15d supply)    EPINEPHrine  0.3 mg/0.3 mL IJ SOAJ injection [id number]  Inject 0.3 mg into the muscle as needed for anaphylaxis. Leopold Damien NOVAK, MD  Active Pharmacy Records           Med Note [facility] N   Mon Sep 09, 2023  9:30 PM)    feeding supplement (ENSURE PLUS HIGH PROTEIN) LIQD [id number]  Take 237 mLs by mouth 2 (two) times daily between meals. Vanessa Elgin LABOR, MD  Active   gabapentin  (NEURONTIN ) 300 MG capsule [id number] Yes TAKE 1 CAPSULE(300 MG) BY MOUTH THREE TIMES DAILY Vanessa Dayton BROCKS, DO  Active   hydrOXYzine  (ATARAX ) 25 MG tablet [id number]  Take 25 mg by mouth every 6 (six) hours as needed for itching. [provider]  Active Pharmacy Records           Med Note STEFFI, ALEXANDRIA   Tue Sep 10, 2023 12:38 PM) No recent fills   metoprolol  succinate (TOPROL -XL) 100 MG 24 hr tablet [id number] Yes Take 1 tablet (100 mg total) by mouth daily. Take with or immediately following a  meal. Vanessa Elgin LABOR, MD  Active   nitroGLYCERIN  (NITROSTAT ) 0.4 MG SL tablet [id number]  Place 1 tablet (0.4 mg total) under the tongue every 5 (five) minutes as needed for chest pain. Vanessa Dayton BROCKS, DO  Active Pharmacy Records  oxyCODONE -acetaminophen  (PERCOCET) 10-325 MG tablet [id number]  Take 1 tablet by mouth 2 (two) times daily as needed for pain. Take by mouth in the morning and afternoon (separated by at least 8 hours). Do not use at night. Vanessa Elgin LABOR, MD  Active   oxyCODONE -acetaminophen  (PERCOCET) 10-325 MG tablet [id number]  Take 1 tablet by mouth every 12 (twelve) hours as needed. Vanessa Dayton BROCKS, DO  Active   pantoprazole  (PROTONIX ) 40 MG tablet [id number]  Take 1 tablet (40 mg total) by mouth daily. Vanessa Dayton BROCKS, DO  Active Pharmacy Records           Med Note STEFFI NIAN   Tue Sep 10, 2023 12:39 PM) Recent Dispenses    08/27/2023 40 MG TBEC (disp 30, 30d supply) 01/31/2023 40 MG TBEC (disp 30, 30d supply)    rivaroxaban  (XARELTO ) 20 MG TABS tablet [id number] Yes TAKE ONE TABLET BY MOUTH DAILY WITH SUPPER Vanessa Damien, DO  Active Pharmacy Records           Med Note STEFFI, NIAN   Tue Sep 10, 2023 12:39 PM) Recent Dispenses    06/11/2023 20 MG TABS (disp 90, 90d supply) 03/11/2023 20 MG TABS (disp 90, 90d supply)    sertraline  (ZOLOFT ) 100 MG tablet [id number] Yes Take 1 tablet (100 mg total) by mouth daily. Gregary Sharper, MD  Active Pharmacy Records           Med Note STEFFI, NIAN   Tue Sep 10, 2023 12:39 PM) Recent Dispenses    08/18/2023 100 MG TABS (disp 90, 90d supply) 07/12/2023 100 MG TABS (disp 31, 31d supply)    spironolactone  (ALDACTONE ) 25 MG tablet [id number] Yes Take 1 tablet (25 mg total) by mouth daily. Palmer Sharper, MD  Active  telmisartan  (MICARDIS ) 20 MG tablet [id number] Yes Take 1 tablet (20 mg total) by mouth daily. Vanessa Dayton BROCKS, DO  Active Pharmacy Records  tolterodine  (DETROL  LA) 2 MG 24 hr capsule [id number]   Take 1 capsule (2 mg total) by mouth daily. Vanessa Perkins, DO  Active Pharmacy Records           Med Note STEFFI, ALEXANDRIA   Tue Sep 10, 2023 12:41 PM)  Recent Dispenses     07/16/2023 2 MG CP24 (disp 30, 30d supply) 06/14/2023 2 MG CP24 (disp 30, 30d supply)     torsemide  (DEMADEX ) 20 MG tablet [id number] Yes Take 1 tablet (20 mg total) by mouth daily. Vanessa Dayton BROCKS, DO  Active Pharmacy Records           Med Note STEFFI, ADELITA   Tue Sep 10, 2023 12:41 PM) Recent Dispenses    08/27/2023 20 MG TABS (disp 90, 90d supply) 06/20/2023 20 MG TABS (disp 180, 90d supply) 09/27/2022 20 MG TABS (disp 180, 90d supply)                Assessment/Plan:    Heart Failure with preserved EF (60-65% in 2021, with G1DD): - Currently appropriately managed  Today, she is euvolemic on exam without LEE. Patient is not currently on an SGLT2i, which could help with fluid management and reduce need for torsemide  - however, would be concerned for risk of GU infections with urinary incontinence.  Blood pressure is currently very close to goal with clinic reading today slightly above goal < 130/80 mmHg. Patient did describe episode of eye swelling, for which she used her epi pen, but did not go to the hospital, a couple weeks ago. She has been able to tolerate ARBs in the past, but does have allergy of angioedema listed to ACEi. Will discuss appropriateness of continued therapy with her PCP. If additional BP control is needed in the future, could consider switching metoprolol  succinate to carvedilol  or adding hydralazine . May be difficult to titrate spironolactone  with fluctuating kidney function. - Reviewed appropriate blood pressure monitoring technique and reviewed goal blood pressure - Reviewed to weigh daily and when to contact cardiology with weight gain - Reviewed dietary modifications including restricting salt and fluid intake - Recommend to monitor blood pressure at home and keep log to  review at next appointment - Recommend to continue current regimen  Current medications:  ACEi/ARB/ARNI: telmisartan  20 mg daily-  will follow-up whether it is appropriate to continue this medication with recent episode of eye swelling (hx of angioedema with ACEi) SGLT2i: none - urinary incontinence, high risk for GU infections Beta blocker: metoprolol  succinate 100 mg daily Mineralocorticoid Receptor Antagonist: spironolactone  25 mg daily Diuretic regimen: torsemide  20 mg daily Other BP medications: amlodipine  10 mg daily   Hyperlipidemia/ASCVD Risk Reduction: - Currently controlled with most recent LDL-C of 55 mg/dL below goal < 70 mg/dL given age + comorbidities (ASCVD > stable angina). High intensity statin indicated. Noted that patient is currently taking DOAC and aspirin , which increases risk of bleeding. Does not appear patient has had a stent or ACS event. Would recommend discontinuing aspirin , but will confirm with PCP. - Reviewed long term complications of uncontrolled cholesterol - Recommend to continue atorvastatin  40 mg daily - Recommend to discontinue aspirin  81 mg daily for stable angina, and continue Xarelto  20 mg daily for DVT prevention/stable CAD. Advised patient to take Xarelto  with a larger meal (lunch or dinner) for better absorption.   Medication Management: - Currently  difficult to assess as patient did not bring medications with her today. Will attempt to review at home at telephone appt on 12/30/23. - Discussed collaboration with local pharmacies for adherence packaging. Reviewed option for [facility] mail order of medications. Patient declined today, but will let us  know if she would like to use this service in the future.  - Patient was educated that in the future, if she has a reaction severe enough to use her epi pen, she needs to go to the ED for further evaluation and management.   Written patient instructions provided. Patient verbalized understanding of  treatment plan.   Follow Up Plan:  Pharmacist telephone 12/30/23 PCP clinic visit scheduled today for 02/04/24   Lorain Baseman, PharmD [facility] [phone]

## 2023-11-28 NOTE — Patient Instructions (Signed)
 It was nice to see you today!  Your goal blood pressure is less than 130/80 mmHg. Please keep a log of your blood pressure to review at our telephone appointment on 12/30/23.  Medication Changes: START taking Xarelto  20 mg daily with a large meal (lunch or supper)  We will work on getting you refills for your epi pen and heartburn medicine today  Continue all other medication the same. Your blood pressure is looking good. Please continue taking your medications every day, and let us  know if you need to switch to mail order.   Lorain Baseman, PharmD [facility] [phone]

## 2023-11-28 NOTE — Telephone Encounter (Signed)
 Return call to Will with [facility] - no answer; left message of office's return call.

## 2023-12-02 MED ORDER — EPINEPHRINE 0.3 MG/0.3ML IJ SOAJ: 0.3000 mg | INTRAMUSCULAR | 3 refills | Status: AC | PRN

## 2023-12-02 MED ORDER — PANTOPRAZOLE SODIUM 40 MG PO TBEC: 40.0000 mg | DELAYED_RELEASE_TABLET | Freq: Every day | ORAL | 3 refills | Status: AC

## 2023-12-03 ENCOUNTER — Telehealth: Payer: Self-pay | Admitting: *Deleted

## 2023-12-03 ENCOUNTER — Telehealth: Payer: Self-pay

## 2023-12-03 DIAGNOSIS — I10 Essential (primary) hypertension: Secondary | ICD-10-CM

## 2023-12-03 MED ORDER — CARVEDILOL 12.5 MG PO TABS: 12.5000 mg | ORAL_TABLET | Freq: Two times a day (BID) | ORAL | 3 refills | Status: AC

## 2023-12-03 MED ORDER — OXYCODONE-ACETAMINOPHEN 10-325 MG PO TABS: 1.0000 | ORAL_TABLET | Freq: Two times a day (BID) | ORAL | 0 refills | Status: DC | PRN

## 2023-12-03 NOTE — Addendum Note (Signed)
 Addended by: ROSAN PRIDE C on: 12/03/2023 03:54 PM   Modules accepted: Orders

## 2023-12-03 NOTE — Telephone Encounter (Signed)
 Return call to [place] PT with [facility] - stated pt is walking better and more with her rolator (walking outside). Pt stated she wants to increase her walking in order to loose weight and have knee surgery in the future. Requesting verbal order  PT once a week x 1 week; twice a week x 3 weeks; then once a week x 4 weeks. VO  given - sending to PCP for approval. Also informed her, pt had an appt with out pharmacist today about her medications.

## 2023-12-03 NOTE — Telephone Encounter (Signed)
 Agree, thank you

## 2023-12-03 NOTE — Telephone Encounter (Signed)
 Copied from CRM [id number]. Topic: Clinical - Home Health Verbal Orders >> Dec 03, 2023  3:14 PM Cherylann RAMAN wrote: Caller/Agency: [facility] Callback Number: [phone] may leave VM is confidential Service Requested: Physical Therapy Frequency: 1 week 1 2 week 3 1 week 4 Any new concerns about the patient? Yes, patient received a call to discontinue Aspirin , Metoprolol , and Telmisartan .

## 2023-12-03 NOTE — Telephone Encounter (Signed)
 Oxycodone  refill sent for 12/06/23 fill date

## 2023-12-03 NOTE — Telephone Encounter (Signed)
 Discussed possible medication changes with PCP, [name], MD, after in-person pharmacy appt on 11/28/23. Successful phone call was made to patient. She had a friend assist her in writing down the medication changes.  - Instructed patient to STOP aspirin  to reduce risk of bleeding given concomitant treatment with Xarelto  - Instructed patient to STOP telmisartan , given questionable recurrent angioedema event, and documented hx of angioedema with lisinopril and losartan  in the past. While patient is at risk for developing ESRD, and would ideally be on an ARB, especially with difficult to control blood pressure, overall risk of life threatening side effect of angioedema outweighs benefit.  - Instructed patient to STOP metoprolol  succinate and switch to carvedilol  12.5 mg TWICE DAILY to provide additional blood pressure control in the setting of stopping her ARB. Will collaborate with PCP to place orders.   Patient verbalized understanding. Agreeable to switch telephone appointment on 12/30/23 at [facility] to in-person appointment to follow-up on medication changes. Instructed patient to bring ALL medications with her. Instructed her to call the office if she has any questions about the above changes.   Patient also requested refill of pain medication, oxycodone -acetaminophen , stating that it is due on 12/06/23  Lorain Baseman, PharmD [facility] [phone]

## 2023-12-04 ENCOUNTER — Other Ambulatory Visit: Payer: Self-pay | Admitting: Internal Medicine

## 2023-12-04 NOTE — Telephone Encounter (Signed)
 Copied from CRM [id number]. Topic: Clinical - Medication Refill >> Dec 04, 2023  1:39 PM Carrielelia G wrote: Medication: oxyCODONE -acetaminophen  (PERCOCET) 10-325 MG tablet  Has the patient contacted their pharmacy? No (Agent: If no, request that the patient contact the pharmacy for the refill. If patient does not wish to contact the pharmacy document the reason why and proceed with request.) (Agent: If yes, when and what did the pharmacy advise?)  This is the patient's preferred pharmacy:  [id number] - [place], [place] - [address] AT [facility] OF [address] [place] [address] Phone: [phone] Fax: [phone]  Is this the correct pharmacy for this prescription? Yes If no, delete pharmacy and type the correct one.    Is the patient out of the medication? No  Has the patient been seen for an appointment in the last year OR does the patient have an upcoming appointment? Yes  Can we respond through MyChart? No  Agent: Please be advised that Rx refills may take up to 3 business days. We ask that you follow-up with your pharmacy.

## 2023-12-05 ENCOUNTER — Telehealth: Payer: Self-pay | Admitting: *Deleted

## 2023-12-05 NOTE — Telephone Encounter (Signed)
 Copied from CRM [id number]. Topic: General - Other >> Dec 05, 2023 11:45 AM Chiquita SQUIBB wrote: Reason for CRM: Will from [facility] is calling in to let the doctor know that the OT is discharging her so PT will then take over. A good call back number for any questions is [phone]

## 2023-12-06 NOTE — Telephone Encounter (Signed)
 Oxycodone  was refilled 12/06/23.

## 2023-12-09 ENCOUNTER — Ambulatory Visit (INDEPENDENT_AMBULATORY_CARE_PROVIDER_SITE_OTHER): Payer: Self-pay | Admitting: Student

## 2023-12-09 ENCOUNTER — Encounter: Payer: Self-pay | Admitting: Student

## 2023-12-09 ENCOUNTER — Telehealth: Payer: Self-pay

## 2023-12-09 VITALS — BP 118/77 | HR 77 | Temp 98.1°F | Ht 62.0 in | Wt 259.2 lb

## 2023-12-09 DIAGNOSIS — Z86718 Personal history of other venous thrombosis and embolism: Secondary | ICD-10-CM | POA: Diagnosis not present

## 2023-12-09 DIAGNOSIS — S7002XA Contusion of left hip, initial encounter: Secondary | ICD-10-CM | POA: Diagnosis not present

## 2023-12-09 DIAGNOSIS — Z7901 Long term (current) use of anticoagulants: Secondary | ICD-10-CM

## 2023-12-09 MED ORDER — DICLOFENAC SODIUM 1 % EX GEL: 2.0000 g | Freq: Four times a day (QID) | CUTANEOUS | 1 refills | Status: AC

## 2023-12-09 NOTE — Patient Instructions (Signed)
 Thank you, Ms.Mandeep D Hereford for allowing us  to provide your care today. Today we discussed:  -Voltaren gel over left hip up to 3-4 times a day as needed -Call the office if bruising worsens or worsening movement of that hip  I have ordered the following medication/changed the following medications:  Start the following medications: Meds ordered this encounter  Medications   diclofenac Sodium (VOLTAREN ARTHRITIS PAIN) 1 % GEL    Sig: Apply 2 g topically 4 (four) times daily.    Dispense:  150 g    Refill:  1     Follow up: Routine visit with Dr. Rosan   Should you have any questions or concerns please call the internal medicine clinic at [phone].    Zamoria Boss, D.O. [facility]

## 2023-12-09 NOTE — Progress Notes (Addendum)
 CC: Acute visit  HPI: [name] is a 73 y.o. female living with a history stated below and presents today for acute visit. Please see problem based assessment and plan for additional details.  Past Medical History:  Diagnosis Date   Acute renal failure superimposed on stage 3b chronic kidney disease, unspecified acute renal failure type (HCC) 09/09/2023   ALCOHOL ABUSE 12/13/2005   Annotation: Sober since 11/06 Qualifier: Diagnosis of  By: Elnor MD, Com[place]     Anemia    Arthritis    all over (02/21/2017)   CAD (coronary artery disease)    s/p non-Q wave MI in 06/2001 and 2004, 2005   CHF (congestive heart failure) (HCC)    Chronic kidney disease (CKD), stage III (moderate) (HCC) 09/19/2010   Chronic mid back pain    Depression    DJD (degenerative joint disease)    DVT (deep venous thrombosis) (HCC)    BLE   DVT of lower extremity, bilateral (HCC) 04/04/2012   On Xarelto      GERD (gastroesophageal reflux disease)    Gout    History of alcohol abuse 12/13/2005   Annotation: Sober since 11/06           Hyperlipidemia    Hypertension    INTRINSIC ASTHMA, WITH EXACERBATION 09/27/2009   Qualifier: Diagnosis of  By: Danella MD, Nodira     Migraine    none anymore (02/21/2017)   Myocardial infarction [facility]) ?2005   Obesity    OSA (obstructive sleep apnea)    previously used CPAP, has lost it (02/21/2017)   Pressure injury of skin 09/09/2023   Rhabdomyolysis 09/09/2023   Seizures (HCC)    As a teenager    Traumatic rhabdomyolysis 09/09/2023    Current Outpatient Medications on File Prior to Visit  Medication Sig Dispense Refill   amLODipine  (NORVASC ) 10 MG tablet Take 1 tablet (10 mg total) by mouth daily.     atorvastatin  (LIPITOR ) 40 MG tablet Take 1 tablet (40 mg total) by mouth daily. 90 tablet 3   budesonide -formoterol  (SYMBICORT ) 160-4.5 MCG/ACT inhaler Inhale 2 puffs into the lungs 3 times/day as needed-between meals & bedtime. 1 each 12   carvedilol   (COREG ) 12.5 MG tablet Take 1 tablet (12.5 mg total) by mouth 2 (two) times daily with a meal. 180 tablet 3   cyclobenzaprine  (FLEXERIL ) 10 MG tablet Take 1 tablet (10 mg total) by mouth 2 (two) times daily as needed for muscle spasms. 30 tablet 0   EPINEPHrine  0.3 mg/0.3 mL IJ SOAJ injection Inject 0.3 mg into the muscle as needed for anaphylaxis. 1 each 3   feeding supplement (ENSURE PLUS HIGH PROTEIN) LIQD Take 237 mLs by mouth 2 (two) times daily between meals.     gabapentin  (NEURONTIN ) 300 MG capsule TAKE 1 CAPSULE(300 MG) BY MOUTH THREE TIMES DAILY 270 capsule 1   hydrOXYzine  (ATARAX ) 25 MG tablet Take 25 mg by mouth every 6 (six) hours as needed for itching.     nitroGLYCERIN  (NITROSTAT ) 0.4 MG SL tablet Place 1 tablet (0.4 mg total) under the tongue every 5 (five) minutes as needed for chest pain. 30 tablet 1   oxyCODONE -acetaminophen  (PERCOCET) 10-325 MG tablet Take 1 tablet by mouth every 12 (twelve) hours as needed. 60 tablet 0   pantoprazole  (PROTONIX ) 40 MG tablet Take 1 tablet (40 mg total) by mouth daily. 90 tablet 3   rivaroxaban  (XARELTO ) 20 MG TABS tablet TAKE ONE TABLET BY MOUTH DAILY WITH SUPPER 90 tablet 3  sertraline  (ZOLOFT ) 100 MG tablet Take 1 tablet (100 mg total) by mouth daily. 90 tablet 3   spironolactone  (ALDACTONE ) 25 MG tablet Take 1 tablet (25 mg total) by mouth daily. 30 tablet 11   tolterodine  (DETROL  LA) 2 MG 24 hr capsule Take 1 capsule (2 mg total) by mouth daily. 90 capsule 3   torsemide  (DEMADEX ) 20 MG tablet Take 1 tablet (20 mg total) by mouth daily. 90 tablet 3   No current facility-administered medications on file prior to visit.    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   Drug use: No   Sexual activity: Not on file  Other Topics Concern   Not on file  Social History Narrative   Current Social History 02/01/2020        Patient lives alone in a/an apartment which is 1 story. There are not steps up to the entrance the patient uses.       Patient's method of transportation is via family member and the city bus.      The highest level of education was GED school diploma.      The patient currently disabled.      Identified important Relationships are Granddaughter       Pets : None       Interests / Fun: I like to Crochet       Current Stressors: My best friend passed away in 2023-12-30       Social Drivers of Health   Financial Resource Strain: Low Risk  (11/13/2023)   Overall Financial Resource Strain (CARDIA)    Difficulty of Paying Living Expenses: Not hard at all  Food Insecurity: No Food Insecurity (11/13/2023)   Hunger Vital Sign    Worried About Running Out of Food in the Last Year: Never true    Ran Out of Food in the Last Year: Never true  Transportation Needs: No Transportation Needs (11/13/2023)   PRAPARE - Administrator, Civil Service (Medical): No    Lack of Transportation (Non-Medical): No  Physical Activity: Inactive (11/13/2023)   Exercise Vital Sign    Days of Exercise per Week: 0 days    Minutes of Exercise per Session: 0 min  Stress: No Stress Concern Present (11/13/2023)    [employer] of Occupational Health - Occupational Stress Questionnaire    Feeling of Stress: Only a little  Social Connections: Moderately Integrated (11/13/2023)   Social Connection and Isolation Panel  Frequency of Communication with Friends and Family: More than three times a week    Frequency of Social Gatherings with Friends and Family: More than three times a week    Attends Religious Services: More than 4 times per year    Active Member of [employer] or Organizations: Yes    Attends Engineer, structural: More than 4 times per year    Marital Status: Divorced  Intimate Partner Violence: Not At Risk (11/13/2023)   Humiliation, Afraid, Rape, and Kick questionnaire    Fear of Current or Ex-Partner: No    Emotionally Abused: No    Physically Abused: No    Sexually Abused: No    Review of Systems: ROS negative except for what is noted on the assessment and plan.  Vitals:   12/09/23 1433  BP: 118/77  Pulse: 77  Temp: 98.1 F (36.7 C)  TempSrc: Oral  SpO2: 94%  Weight: 259 lb 3.2 oz (117.6 kg)  Height: 5' 2 (1.575 m)   Physical Exam: Constitutional: Alert, sitting in chair, in no acute distress Cardiovascular: regular rate  Pulmonary/Chest: normal work of breathing on room air MSK: able to perform active and passive ROM of L hip and knee but limited due to chronic pain; less ROM of R LE Neurological: awake and alert Skin: large ecchymosis over lateral left hip (see photo) and smaller ecchymosis on medial thigh   Assessment & Plan:   Assessment & Plan Contusion of left hip, initial encounter Acute visit for left lateral hip bruising for past 1-2 weeks. Some TTP. Denies recent falls, injury or trauma. Unable to recall bumping into anything by accident. Aide in room today denies any recent falls or trauma. Sleeps in bed but at times on her rolling chair. Exam w/ bruise of L lateral hip and smaller one at medial thigh but ROM intact/at baseline per patient/aide.  Adherent to Xarelto  w/ hx of DVT 2014. Suspect contusion from mild injury while on anticoagulation. No imaging at this time given no recent falls and stable physical exam. Does not appear to have developing hematoma, hold off checking CBC right now, ecchymosis resolving. Discussed w/ patient and aide that patient is prone to easier bruising w/ AC.   Plan -Supportive care and sent over Voltaren gel QID PRN -Return precautions discussed w/ patient and aide  -May need to consider stopping anticoagulation if further bruising develops or signs of bleeding   No orders of the defined types were placed in this encounter.  Return if symptoms worsen or fail to improve.   Patient discussed with Dr. Machen  Delawrence Fridman, D.O. [facility] Health Internal Medicine, PGY-3 Phone: [phone] Date 12/09/2023 Time 8:13 PM

## 2023-12-10 ENCOUNTER — Telehealth: Payer: Self-pay

## 2023-12-10 NOTE — Patient Outreach (Signed)
 Care Coordination   12/10/2023 Name: ROMEKA SCIFRES MRN: [id number] DOB: 1950/08/02   Care Coordination Outreach Attempts:  An unsuccessful outreach was attempted for an appointment today.  Follow Up Plan:  Additional outreach attempts will be made to complete CCM follow-up visit.   Encounter Outcome: Patient reports she is at a class. Patient Request to Call Back at a later date.   Rosaline Finlay, RN MSN [place]  [facility] Population Health RN Care Manager Direct Dial: [phone]  Fax: [phone]

## 2023-12-10 NOTE — Progress Notes (Signed)
 Internal Medicine Clinic Attending  Case discussed with the resident at the time of the visit.  We reviewed the resident's history and exam and pertinent patient test results.  I agree with the assessment, diagnosis, and plan of care documented in the resident's note.

## 2023-12-11 NOTE — Patient Outreach (Signed)
 Care Coordination   12/11/2023 Name: Vanessa Palmer MRN: [id number] DOB: [birth year]   Care Coordination Outreach Attempts:  A second unsuccessful outreach was attempted today to complete CCM follow-up visit.  Follow Up Plan:  Additional outreach attempts will be made to complete follow-up visit.   Encounter Outcome:  No Answer. HIPAA compliant voicemail left requesting return call.   Rosaline Finlay, RN MSN [place]  [facility] Population Health RN Care Manager Direct Dial: [phone]  Fax: [phone]

## 2023-12-13 NOTE — Patient Outreach (Signed)
 Care Coordination   12/13/2023 Name: Vanessa Palmer MRN: [id number] DOB: [birth year]   Care Coordination Outreach Attempts:  A third unsuccessful outreach was attempted today to complete CCM follow-up visit and assist in connecting patient with care navigator through [facility] D-SNP.  Follow Up Plan:  Additional outreach attempts will be made to complete follow-up visit.   Encounter Outcome:  Patient answers phone call but is not currently home. Patient Request to Call Back at later date   Rosaline Finlay, RN MSN [place]  [facility] Health RN Care Manager Direct Dial: [phone]  Fax: [phone]

## 2023-12-16 ENCOUNTER — Telehealth: Payer: Self-pay | Admitting: Orthopaedic Surgery

## 2023-12-16 NOTE — Telephone Encounter (Signed)
 Pt called stating that she wants her cyclobenzaprine  called in. Pharmacy is [facility] on [address]. Pt would like call back when called in . Pt call back number is [phone]

## 2023-12-16 NOTE — Telephone Encounter (Signed)
 Please advsie Last seen 11/2022

## 2023-12-16 NOTE — Telephone Encounter (Signed)
 [name] for patient

## 2023-12-16 NOTE — Telephone Encounter (Signed)
 Pt called wanting her cyclobenzaprine  called in. Pharmacy is [facility] on [address]. Pt would like call back when called in. Pt call back number is [phone]

## 2023-12-17 NOTE — Patient Instructions (Signed)
 Zuleika D Poucher - I have attempted to call you three times but have been unsuccessful in reaching you. I work with [name], DO and am calling to support your healthcare needs. If I can be of assistance to you, please contact me at [phone].     You have access to a Care Navigator through your [facility] plan. Your care navigator can assist with scheduling appointments and transportation to your doctor, reviewing benefits, and making a plan regarding medical needs. Please contact the [facility] Care Navigator line at [phone], and ask to be connected to your Care Navigator for assistance with these needs.  Thank you,  Rosaline Finlay, RN MSN [place]  [facility] Health RN Care Manager Direct Dial: [phone]  Fax: [phone]

## 2023-12-17 NOTE — Patient Outreach (Signed)
 Care Coordination   12/17/2023 Name: Vanessa Palmer MRN: [id number] DOB: 1950-09-07   Care Coordination Outreach Attempts:  A fourth unsuccessful outreach was attempted today to complete CCM follow-up visit and assist in connecting patient with care navigator through [facility] D-SNP.  Follow Up Plan:  No further outreach attempts will be made at this time. We have been unable to contact the patient to connect to care navigator.  Encounter Outcome:  No Answer. HIPAA compliant voicemail left requesting return call. MyChart message sent including instructions on how to contact Care Navigator through [facility] D-SNP. Patient has been disenrolled from CCM due to unsuccessful outreaches and having D-SNP.   Rosaline Finlay, RN MSN [place]  [facility] Population Health RN Care Manager Direct Dial: [phone]  Fax: [phone]

## 2023-12-23 ENCOUNTER — Other Ambulatory Visit: Payer: Self-pay

## 2023-12-23 ENCOUNTER — Emergency Department (HOSPITAL_COMMUNITY)
Admission: EM | Admit: 2023-12-23 | Discharge: 2023-12-23 | Disposition: A | Discharge status: Home / Self Care | Attending: Emergency Medicine | Admitting: Emergency Medicine

## 2023-12-23 DIAGNOSIS — K130 Diseases of lips: Secondary | ICD-10-CM | POA: Diagnosis present

## 2023-12-23 DIAGNOSIS — T783XXA Angioneurotic edema, initial encounter: Secondary | ICD-10-CM | POA: Insufficient documentation

## 2023-12-23 LAB — BASIC METABOLIC PANEL WITH GFR
Anion gap: 11 (ref 5–15)
BUN: 29 mg/dL — ABNORMAL HIGH (ref 8–23)
CO2: 21 mmol/L — ABNORMAL LOW (ref 22–32)
Calcium: 9 mg/dL (ref 8.9–10.3)
Chloride: 104 mmol/L (ref 98–111)
Creatinine, Ser: 2.04 mg/dL — ABNORMAL HIGH (ref 0.44–1.00)
GFR, Estimated: 25 mL/min — ABNORMAL LOW (ref >60)
Glucose, Bld: 100 mg/dL — ABNORMAL HIGH (ref 70–99)
Potassium: 4.9 mmol/L (ref 3.5–5.1)
Sodium: 136 mmol/L (ref 135–145)

## 2023-12-23 LAB — CBC WITH DIFFERENTIAL/PLATELET
Abs Immature Granulocytes: 0.01 K/uL (ref 0.00–0.07)
Basophils Absolute: 0 K/uL (ref 0.0–0.1)
Basophils Relative: 1 %
Eosinophils Absolute: 0.2 K/uL (ref 0.0–0.5)
Eosinophils Relative: 4 %
HCT: 30.8 % — ABNORMAL LOW (ref 36.0–46.0)
Hemoglobin: 9.3 g/dL — ABNORMAL LOW (ref 12.0–15.0)
Immature Granulocytes: 0 %
Lymphocytes Relative: 22 %
Lymphs Abs: 1.1 K/uL (ref 0.7–4.0)
MCH: 28.7 pg (ref 26.0–34.0)
MCHC: 30.2 g/dL (ref 30.0–36.0)
MCV: 95.1 fL (ref 80.0–100.0)
Monocytes Absolute: 0.4 K/uL (ref 0.1–1.0)
Monocytes Relative: 9 %
Neutro Abs: 3.3 K/uL (ref 1.7–7.7)
Neutrophils Relative %: 64 %
Platelets: 249 K/uL (ref 150–400)
RBC: 3.24 MIL/uL — ABNORMAL LOW (ref 3.87–5.11)
RDW: 14.6 % (ref 11.5–15.5)
WBC: 5.1 K/uL (ref 4.0–10.5)
nRBC: 0 % (ref 0.0–0.2)

## 2023-12-23 MED ORDER — METHYLPREDNISOLONE SODIUM SUCC 125 MG IJ SOLR
125.0000 mg | Freq: Once | INTRAMUSCULAR | Status: AC
  Administered 2023-12-23: 125 mg via INTRAVENOUS
  Filled 2023-12-23: qty 2

## 2023-12-23 MED ORDER — FAMOTIDINE IN NACL 20-0.9 MG/50ML-% IV SOLN
20.0000 mg | Freq: Once | INTRAVENOUS | Status: AC
  Administered 2023-12-23: 20 mg via INTRAVENOUS
  Filled 2023-12-23: qty 50

## 2023-12-23 MED ORDER — SODIUM CHLORIDE 0.9 % IV BOLUS
500.0000 mL | Freq: Once | INTRAVENOUS | Status: AC
  Administered 2023-12-23: 500 mL via INTRAVENOUS

## 2023-12-23 MED ORDER — DIPHENHYDRAMINE HCL 50 MG/ML IJ SOLN
50.0000 mg | Freq: Once | INTRAMUSCULAR | Status: AC
  Administered 2023-12-23: 50 mg via INTRAVENOUS
  Filled 2023-12-23: qty 1

## 2023-12-23 NOTE — ED Provider Triage Note (Signed)
 Emergency Medicine Provider Triage Evaluation Note  Vanessa Palmer , a 73 y.o. female  was evaluated in triage.  Pt complains of allergic reaction.  Unclear exactly what patient may have been exposed to.  History of angioedema.  Not current on any ACE or ARB medications.  Reportedly was at a substance use disorder class and began to experience swelling to her lips and tongue.  Reports some difficulty with swallowing but denies difficulty breathing.  Unclear last angioedema episode.  Denies any recent contact with any allergens as far she is aware.  Review of Systems  Positive: As above Negative: As above  Physical Exam  BP (!) 174/88 (BP Location: Left Wrist)   Pulse 72   Temp 97.8 F (36.6 C) (Oral)   Resp 16   SpO2 100%  Gen:   Awake, no distress   Resp:  Normal effort  MSK:   Moves extremities without difficulty  Other:  Normal swelling to the lower lip and the tongue.  No appreciable tongue protrusion at rest.  She is alert and oriented at baseline appears to be tolerating oral secretions as there is no drooling present.  Medical Decision Making  Medically screening exam initiated at 1:51 PM.  Appropriate orders placed.  Quinci D Cadmus was informed that the remainder of the evaluation will be completed by another provider, this initial triage assessment does not replace that evaluation, and the importance of remaining in the ED until their evaluation is complete.     Patriece Archbold A, PA-C 12/23/23 1352

## 2023-12-23 NOTE — ED Notes (Signed)
 Daughter called on her way

## 2023-12-23 NOTE — ED Provider Notes (Signed)
 [place] EMERGENCY DEPARTMENT AT [facility] Provider Note   CSN: [id number] Arrival date & time: 12/23/23  1336     Patient presents with: No chief complaint on file.   Vanessa Palmer is a 73 y.o. female.  {Add pertinent medical, surgical, social history, OB history to HPI:8030} 73 year old female with prior medical history as detailed below presents for evaluation.  Patient was on a field trip with her substance abuse program.  She is about the at lunch.  She slightly began to experience swelling to her lower lip.  She reports a history of angioedema.  She is not currently taking an ACE or ARB medication.  She denies difficulty swallowing or breathing.  Her phonation is clear, given that she is almost edentulous.  The history is provided by the patient and medical records.       Prior to Admission medications   Medication Sig Start Date End Date Taking? Authorizing Provider  amLODipine  (NORVASC ) 10 MG tablet Take 1 tablet (10 mg total) by mouth daily. 09/12/23   Briana Elgin LABOR, MD  atorvastatin  (LIPITOR ) 40 MG tablet Take 1 tablet (40 mg total) by mouth daily. 06/11/23   Addie Perkins, DO  budesonide -formoterol  (SYMBICORT ) 160-4.5 MCG/ACT inhaler Inhale 2 puffs into the lungs 3 times/day as needed-between meals & bedtime. 07/27/22   Leopold Perkins NOVAK, MD  carvedilol  (COREG ) 12.5 MG tablet Take 1 tablet (12.5 mg total) by mouth 2 (two) times daily with a meal. 12/03/23   [name] Sharper, MD  cyclobenzaprine  (FLEXERIL ) 10 MG tablet Take 1 tablet (10 mg total) by mouth 2 (two) times daily as needed for muscle spasms. 08/09/23   Vernetta Lonni GRADE, MD  diclofenac Sodium (VOLTAREN ARTHRITIS PAIN) 1 % GEL Apply 2 g topically 4 (four) times daily. 12/09/23   Elicia Sharper, DO  EPINEPHrine  0.3 mg/0.3 mL IJ SOAJ injection Inject 0.3 mg into the muscle as needed for anaphylaxis. 12/02/23   [name] Sharper, MD  feeding supplement (ENSURE PLUS HIGH PROTEIN) LIQD Take 237 mLs  by mouth 2 (two) times daily between meals. 09/12/23   Briana Elgin LABOR, MD  gabapentin  (NEURONTIN ) 300 MG capsule TAKE 1 CAPSULE(300 MG) BY MOUTH THREE TIMES DAILY 11/18/23   [name], DO  hydrOXYzine  (ATARAX ) 25 MG tablet Take 25 mg by mouth every 6 (six) hours as needed for itching.    [provider]  nitroGLYCERIN  (NITROSTAT ) 0.4 MG SL tablet Place 1 tablet (0.4 mg total) under the tongue every 5 (five) minutes as needed for chest pain. 07/04/23   [name], DO  oxyCODONE -acetaminophen  (PERCOCET) 10-325 MG tablet Take 1 tablet by mouth every 12 (twelve) hours as needed. 12/06/23   [name], DO  pantoprazole  (PROTONIX ) 40 MG tablet Take 1 tablet (40 mg total) by mouth daily. 12/02/23   [name] Sharper, MD  rivaroxaban  (XARELTO ) 20 MG TABS tablet TAKE ONE TABLET BY MOUTH DAILY WITH SUPPER 06/11/23   Addie Perkins, DO  sertraline  (ZOLOFT ) 100 MG tablet Take 1 tablet (100 mg total) by mouth daily. 04/19/23 04/18/24  Gregary Sharper, MD  spironolactone  (ALDACTONE ) 25 MG tablet Take 1 tablet (25 mg total) by mouth daily. 10/14/23 10/13/24  [name] Sharper, MD  tolterodine  (DETROL  LA) 2 MG 24 hr capsule Take 1 capsule (2 mg total) by mouth daily. 06/11/23   Addie Perkins, DO  torsemide  (DEMADEX ) 20 MG tablet Take 1 tablet (20 mg total) by mouth daily. 08/15/23   [name], DO  Allergies: Ace inhibitors, Other, Regadenoson , Shellfish allergy, Peanut oil, Hydrocodone -acetaminophen , Hydromorphone  hcl, Oxycodone -acetaminophen , and Propoxyphene n-acetaminophen     Review of Systems  All other systems reviewed and are negative.   Updated Vital Signs BP (!) 174/88 (BP Location: Left Wrist)   Pulse 72   Temp 97.8 F (36.6 C) (Oral)   Resp 16   SpO2 100%   Physical Exam Vitals and nursing note reviewed.  Constitutional:      General: She is not in acute distress.    Appearance: Normal appearance. She is well-developed.  HENT:     Head: Normocephalic and atraumatic.      Mouth/Throat:     Comments: Mild edema to the right lower lip.  No swelling of the tongue.  Essentially normal phonation.  No difficulty handling her secretions.  No stridor. Eyes:     Conjunctiva/sclera: Conjunctivae normal.     Pupils: Pupils are equal, round, and reactive to light.  Cardiovascular:     Rate and Rhythm: Normal rate and regular rhythm.     Heart sounds: Normal heart sounds.  Pulmonary:     Effort: Pulmonary effort is normal. No respiratory distress.     Breath sounds: Normal breath sounds.  Abdominal:     General: There is no distension.     Palpations: Abdomen is soft.     Tenderness: There is no abdominal tenderness.  Musculoskeletal:        General: No deformity. Normal range of motion.     Cervical back: Normal range of motion and neck supple.  Skin:    General: Skin is warm and dry.  Neurological:     General: No focal deficit present.     Mental Status: She is alert and oriented to person, place, and time.     (all labs ordered are listed, but only abnormal results are displayed) Labs Reviewed - No data to display  EKG: None  Radiology: No results found.  {Document cardiac monitor, telemetry assessment procedure when appropriate:32947} Procedures   Medications Ordered in the ED  diphenhydrAMINE  (BENADRYL ) injection 50 mg (has no administration in time range)  methylPREDNISolone  sodium succinate (SOLU-MEDROL ) 125 mg/2 mL injection 125 mg (has no administration in time range)      {Click here for ABCD2, HEART and other calculators REFRESH Note before signing:1}                              Medical Decision Making Patient with right lower lip edema.  This is consistent with early angioedema.  Steroids, Benadryl , Pepcid  administered.  Baseline CBC and BMP ordered.  Patient will require observation prior to likely discharge.  Oncoming ED provider is aware of case.  Amount and/or Complexity of Data Reviewed Labs:  ordered.  Risk Prescription drug management.     {Document critical care time when appropriate  Document review of labs and clinical decision tools ie CHADS2VASC2, etc  Document your independent review of radiology images and any outside records  Document your discussion with family members, caretakers and with consultants  Document social determinants of health affecting pt's care  Document your decision making why or why not admission, treatments were needed:32947:::1}   Final diagnoses:  None    ED Discharge Orders     None

## 2023-12-23 NOTE — ED Triage Notes (Signed)
 Pt reports that 30 min PTA she began having sudden onset of lip and tongue swelling. Pt has trouble getting her words out and reports trouble swallowing. No resp distress at this time. No exposure to known allergens. No ACE or ARB on pt med list as pt has hx of angioedema.

## 2023-12-23 NOTE — ED Provider Notes (Signed)
  Physical Exam  BP (!) 174/88 (BP Location: Left Wrist)   Pulse 72   Temp 97.8 F (36.6 C) (Oral)   Resp 16   SpO2 100%   Physical Exam  Procedures  Procedures  ED Course / MDM    Medical Decision Making Amount and/or Complexity of Data Reviewed Labs: ordered.  Risk Prescription drug management.   Received in signout.  History of angioedema.  Swelling of right lower lip.  Patient states she feels that is getting better.  Will continue to monitor.  Patient now has been stable for around 4 hours.  Feeling better.  No further swelling.  Will discharge home with outpatient follow-up as needed.     Patsey Lot, MD 12/23/23 1750

## 2023-12-30 ENCOUNTER — Other Ambulatory Visit

## 2023-12-30 ENCOUNTER — Ambulatory Visit

## 2023-12-30 ENCOUNTER — Encounter: Payer: Self-pay | Admitting: Radiology

## 2023-12-30 VITALS — BP 131/66 | HR 77

## 2023-12-30 DIAGNOSIS — Z7901 Long term (current) use of anticoagulants: Secondary | ICD-10-CM | POA: Diagnosis not present

## 2023-12-30 DIAGNOSIS — Z79899 Other long term (current) drug therapy: Secondary | ICD-10-CM

## 2023-12-30 DIAGNOSIS — I110 Hypertensive heart disease with heart failure: Secondary | ICD-10-CM | POA: Diagnosis not present

## 2023-12-30 DIAGNOSIS — I5030 Unspecified diastolic (congestive) heart failure: Secondary | ICD-10-CM

## 2023-12-30 DIAGNOSIS — E785 Hyperlipidemia, unspecified: Secondary | ICD-10-CM

## 2023-12-30 DIAGNOSIS — Z5181 Encounter for therapeutic drug level monitoring: Secondary | ICD-10-CM

## 2023-12-30 DIAGNOSIS — I10 Essential (primary) hypertension: Secondary | ICD-10-CM

## 2023-12-30 NOTE — Patient Instructions (Signed)
 It was nice to see you today!  Your goal blood pressure is less than 130/80 mmHg.   Medication Changes:  Start taking carvedilol  12.5 mg TWICE daily as prescribed  We are not making any medication changes today. Continue taking your medications as prescribed every day.   Lorain Baseman, PharmD [facility] [phone]

## 2023-12-30 NOTE — Progress Notes (Signed)
 12/30/2023 Name: Vanessa Palmer MRN: [id number] DOB: [birth year]  Chief Complaint  Patient presents with   Hypertension    Vanessa Palmer is a 73 y.o. year old female who was referred for medication management by their primary care provider, [name], DO. They presented for a face to face visit today.   They were referred to the pharmacist by their PCP for assistance in managing hypertension . PMH includes HTN, chronic stable angina, asthma, HFpEF with LVH (EF 60-65% in 2021, with G1DD), OSA, asthma, GERD, hearing loss, CKD3, HLD, depression, bilateral DVT in 2014 (on lifelong anticoag).   Subjective: Patient was seen by PCP, Dr. [name], on 10/14/23. At last visit, BP was 143/91 mmHg, HR 80 bpm. She was instructed to start spironolactone  25 mg daily, continue telmisartan  20 mg daily, amlodipine  10 mg daily, and metoprolol  succinate 100 mg daily. She was instructed to start taking daily weights to guide adjustment in diuretic (torsemide ) dose. On repeat BMP on 10/29/23 (after appt with Dr. [name]), Scr improved from 2.47 > 1.74 mg/dL (eGFR 20 > 31 mL/min) after starting spironolactone . K decreased from 5.0 > 4.6 mE/L. We have rescheduled her pharmacy appt multiple times. She has since seen Dr. [name] on 11/13/23. BP was improved at 113/79 mmH, HR 83 bpm. No changes were made to her regimen. At in person pharmacy appt on 11/28/23, she did not have her medications with her. Described an episode concerning for angioedema where she used her epi-pen but did not go to the ED. Discussed case with her PCP who confirmed it was appropriate to stop telmisartan  given concern for angioedema and switch metoprolol  to carvedilol  12.5 mg BID for blood pressure control. She was also advised to stop aspirin , since she is also treated with Xarelto  for DVT prevention. She saw Dr. [name] for L hip bruising. She did visit the ED for early angioedema (lip swelling) on 12/23/23, in which the note states she had not been  taking an ACEi or ARB.  Today, patient is doing ok. She presents with her walker, but required a wheelchair to come back to the exam room. She brought her medications with her today. She had made the changes we discussed over the phone on 12/03/23 as she is no longer taking aspirin  with her Xarelto , she stopped telmisartan , and has started carvedilol  and stopped metoprolol . However, she does report she is only taking carvedilol  once daily, as she was not aware it is a twice daily medication. She also had a bottle of hydralazine  50 mg tabs with her and says she is taking 100 mg BID, which was not currently on her medication list. She reports she has not had any recurrent swelling/angioedema since ED visit on 12/23/23. She reports bruising on her hip has resolved. She reports she is having worse back pain recently.   Care Team: Primary Care Provider: Dr. [name] 02/04/24  Medication Access/Adherence  Current Pharmacy:  [id number] [place], [place] - [address] AT [facility] OF [address] [place] [address] Phone: [phone] Fax: [phone]  [id number] - [place], [place] - [address] AT [facility] [address] [place] [address] Phone: [phone] Fax: [phone]  [place] [facility] [address] [place] [address] Phone: [phone] Fax: [phone]   Patient reports affordability concerns with their medications: No  Patient reports access/transportation concerns to their pharmacy: No  -  someone picks up her medications for her from [place]. Offered to switch her to mail order pharmacy today - she declined today, but said she would consider it.  Patient reports adherence concerns with their medications:  Yes  - taking hydralazine  100 mg BID, though this is not on her med list. Only taking carvedilol  once daily.   Heart Failure/HTN (EF  60-65% in May 2021):  Current medications:  ACEi/ARB/ARNI: C/I due to hx of angioedema on ACEi and ARB SGLT2i: none - urinary incontinence, high risk for GU infections Beta blocker: carvedilol  12.5 mg BID Mineralocorticoid Receptor Antagonist: spironolactone  25 mg daily Diuretic regimen: torsemide  20 mg daily - does not have with her today, unclear if taking, hx of poor adherence due to issues with incontinence. Other BP medications: amlodipine  10 mg daily, hydralazine  100 mg BID (2x 50 mg tablets) in the morning and at bedtime.  Current home blood pressure readings: - has a BP cuff but is not checking at home and unable to report readings today Current home weights: not checking - limited by mobility  Patient reportsvolume overload signs or symptoms including shortness of breath with activity, lower extremity edema (appears stable on exam). Reports she did have some sharp pain across her chest the other day, that felt different than heartburn. Also complains of sharp pain in her back when she breathes or urinates.  Current meal patterns:  Reports she does not add salt to foods Foods mostly prepared at home.  Current medication access support: [employer] Dual Complete  Medication Management:  Current adherence strategy: patient takes medications out of the bottles  Patient reports Fair adherence to medications - she did not have torsemide  with her today (last filled 08/27/23 for 90ds) or tolterodine  (filled 07/16/23 for 30ds). She did have hydralazine , which was not currently on her medication. However she had appropriately removed telmisartan , aspirin , and metoprolol  from her daily medications.  Patient reports the following barriers to adherence: not understanding what medications are for  Objective:  BP Readings from Last 3 Encounters:  12/30/23 131/66  12/23/23 (!) 174/88  12/09/23 118/77    Lab Results  Component Value Date   HGBA1C 5.6 07/27/2022   HGBA1C 5.5 12/16/2014   HGBA1C  5.7 01/06/2014       Latest Ref Rng & Units 12/23/2023    2:02 PM 10/29/2023   11:12 AM 10/07/2023    9:23 AM  BMP  Glucose 70 - 99 mg/dL 899  93  85   BUN 8 - 23 mg/dL 29  20  31    Creatinine 0.44 - 1.00 mg/dL 7.95  8.25  7.52   BUN/Creat Ratio 12 - 28  11  13    Sodium 135 - 145 mmol/L 136  135  136   Potassium 3.5 - 5.1 mmol/L 4.9  4.6  5.0   Chloride 98 - 111 mmol/L 104  102  101   CO2 22 - 32 mmol/L 21  17  20    Calcium  8.9 - 10.3 mg/dL 9.0  8.9  9.3     Lab Results  Component Value Date   CHOL 110 10/05/2022   HDL 36 (L) 10/05/2022   LDLCALC 55 10/05/2022   TRIG 100 10/05/2022   CHOLHDL 3.1 10/05/2022    Medications Reviewed Today     Reviewed by Brinda Lorain SQUIBB, RPH (Pharmacist) on 12/30/23 at 1659  Med List Status: <None>   Medication Order Taking? Sig Documenting Provider Last Dose Status Informant  amLODipine  (NORVASC ) 10 MG tablet  [id number] Yes Take 1 tablet (10 mg total) by mouth daily. Briana Elgin LABOR, MD  Active   atorvastatin  (LIPITOR ) 40 MG tablet [id number] Yes Take 1 tablet (40 mg total) by mouth daily. Addie Perkins, DO  Active Pharmacy Records           Med Note STEFFI, ADELITA   Tue Sep 10, 2023 12:36 PM)  Recent Dispenses     08/18/2023 40 MG TABS (disp 90, 90d supply) 06/11/2023 40 MG TABS (disp 90, 90d supply)     budesonide -formoterol  (SYMBICORT ) 160-4.5 MCG/ACT inhaler [id number] Yes Inhale 2 puffs into the lungs 3 times/day as needed-between meals & bedtime. Leopold Perkins NOVAK, MD  Active Pharmacy Records           Med Note STEFFI, ALEXANDRIA   Tue Sep 10, 2023 12:37 PM) No recent fills  carvedilol  (COREG ) 12.5 MG tablet [id number] Yes Take 1 tablet (12.5 mg total) by mouth 2 (two) times daily with a meal. [name] Sharper, MD  Active   cyclobenzaprine  (FLEXERIL ) 10 MG tablet [id number]  Take 1 tablet (10 mg total) by mouth 2 (two) times daily as needed for muscle spasms. Vernetta Lonni GRADE, MD  Active Pharmacy Records           Med Note  STEFFI, ALEXANDRIA   Tue Sep 10, 2023 12:37 PM) Recent Dispenses    08/09/2023 10 MG TABS (disp 30, 15d supply) 05/30/2023 10 MG TABS (disp 30, 15d supply)    diclofenac Sodium (VOLTAREN ARTHRITIS PAIN) 1 % GEL [id number]  Apply 2 g topically 4 (four) times daily. Elicia Sharper, DO  Active   EPINEPHrine  0.3 mg/0.3 mL IJ SOAJ injection [id number]  Inject 0.3 mg into the muscle as needed for anaphylaxis. [name] Sharper, MD  Active   feeding supplement (ENSURE PLUS HIGH PROTEIN) LIQD [id number]  Take 237 mLs by mouth 2 (two) times daily between meals. Briana Elgin LABOR, MD  Active   gabapentin  (NEURONTIN ) 300 MG capsule [id number] Yes TAKE 1 CAPSULE(300 MG) BY MOUTH THREE TIMES DAILY [name], DO  Active   hydrALAZINE  (APRESOLINE ) 50 MG tablet [id number] Yes Take 100 mg by mouth in the morning and at bedtime. [provider]  Active     Discontinued 12/30/23 1645 (Expired Prescription)          Med Note STEFFI, ALEXANDRIA   Tue Sep 10, 2023 12:38 PM) No recent fills   nitroGLYCERIN  (NITROSTAT ) 0.4 MG SL tablet [id number]  Place 1 tablet (0.4 mg total) under the tongue every 5 (five) minutes as needed for chest pain. [name], DO  Active Pharmacy Records  oxyCODONE -acetaminophen  (PERCOCET) 10-325 MG tablet [id number] Yes Take 1 tablet by mouth every 12 (twelve) hours as needed. [name], DO  Active   pantoprazole  (PROTONIX ) 40 MG tablet [id number] Yes Take 1 tablet (40 mg total) by mouth daily. [name] Sharper, MD  Active   rivaroxaban  (XARELTO ) 20 MG TABS tablet [id number] Yes TAKE ONE TABLET BY MOUTH DAILY WITH SUPPER Addie Perkins, DO  Active Pharmacy Records           Med Note STEFFI, ADELITA   Tue Sep 10, 2023 12:39 PM) Recent Dispenses    06/11/2023 20 MG TABS (disp 90, 90d supply) 03/11/2023 20 MG TABS (disp 90, 90d supply)    sertraline  (ZOLOFT ) 100 MG tablet [id number] Yes Take 1 tablet (100 mg total) by mouth daily. Gregary Sharper, MD  Active Pharmacy  Records  Med Note STEFFI NIAN   Tue Sep 10, 2023 12:39 PM) Recent Dispenses    08/18/2023 100 MG TABS (disp 90, 90d supply) 07/12/2023 100 MG TABS (disp 31, 31d supply)    spironolactone  (ALDACTONE ) 25 MG tablet [id number] Yes Take 1 tablet (25 mg total) by mouth daily. [name] Sharper, MD  Active   tolterodine  (DETROL  LA) 2 MG 24 hr capsule [id number]  Take 1 capsule (2 mg total) by mouth daily.  Patient not taking: Reported on 12/30/2023   Addie Perkins, DO  Active Pharmacy Records           Med Note STEFFI, NIAN   Tue Sep 10, 2023 12:41 PM)  Recent Dispenses     07/16/2023 2 MG CP24 (disp 30, 30d supply) 06/14/2023 2 MG CP24 (disp 30, 30d supply)     torsemide  (DEMADEX ) 20 MG tablet [id number]  Take 1 tablet (20 mg total) by mouth daily. [name], DO  Active Pharmacy Records           Med Note STEFFI, NIAN   Tue Sep 10, 2023 12:41 PM) Recent Dispenses    08/27/2023 20 MG TABS (disp 90, 90d supply) 06/20/2023 20 MG TABS (disp 180, 90d supply) 09/27/2022 20 MG TABS (disp 180, 90d supply)                Assessment/Plan:    Heart Failure with preserved EF (60-65% in 2021, with G1DD): - Currently appropriately managed. She does have some chronic LEE, so it is difficult to assess fluid status. Her BP appears to be stable and close to goal <130/80 mmHg since discontinuing telmisartan  due to concern for angioedema and switching from metoprolol  to carvedilol . She continues to be a poor candidate for SGLT2i with elevated risk of GU infections. She states she is currently taking hydralazine , which was discontinued after July 2025 hospitalization. However, she appears to be tolerating without issue. In the ED on 12/23/23, Scr was elevated > 2 mg/g and eGFR was < 30 mL/min. Potassium has been WNL. Will recheck BMP today to assess appropriateness of continuing spironolactone .  - Reviewed appropriate blood pressure monitoring technique and  reviewed goal blood pressure - Reviewed to weigh daily and when to contact cardiology with weight gain - Reviewed dietary modifications including restricting salt and fluid intake - Recommend to monitor blood pressure at home and keep log to review at next appointment - Recommend to continue current regimen  Current medications:  ACEi/ARB/ARNI: C/I due to angioedema while on an ACEi and ARB SGLT2i: none - urinary incontinence, high risk for GU infections Beta blocker: carvedilol  12.5 mg BID - advised to take BID as prescribed Mineralocorticoid Receptor Antagonist: spironolactone  25 mg daily Diuretic regimen: torsemide  20 mg daily Other BP medications: amlodipine  10 mg daily, hydralazine  100 mg BID - Obtained BMP today to monitor Scr. If eGFR persistently < 30 mL/min, may have to discontinue spironolactone . Will follow-up labs.   Hyperlipidemia/ASCVD Risk Reduction: - Currently controlled with most recent LDL-C of 55 mg/dL below goal < 70 mg/dL given age + comorbidities (ASCVD > stable angina). High intensity statin indicated.  - Reviewed long term complications of uncontrolled cholesterol - Recommend to continue atorvastatin  40 mg daily - Recommend to  continue Xarelto  20 mg daily for DVT prevention/stable CAD. Confirmed that patient is taking with a larger meal. - Yearly lipid panel obtained today   Medication Management: - Completely medication reconciliation today as patient did bring her medications with her. She did not have torsemide   or tolterodine  with her today. She did have hydralazine  50 mg tablets (taking 100 mg BID) which was not currently on her medication list. However, she was advised to continue this medication in the setting of controlled BP without s/sx of hypotension.  - Will call pharmacy to request refill of torsemide  and ensure that telmisartan  is discontinued - Will defer to PCP to discuss whether it is appropriate for patient to resume tolterodine  for urinary  incontinence.   Written patient instructions provided. Patient verbalized understanding of treatment plan.    Follow Up Plan:  Pharmacist telephone 02/17/24 PCP clinic visit 02/04/24   Lorain Baseman, PharmD [facility] [phone]

## 2023-12-31 ENCOUNTER — Ambulatory Visit: Payer: Self-pay

## 2023-12-31 ENCOUNTER — Telehealth: Payer: Self-pay | Admitting: Pharmacy Technician

## 2023-12-31 DIAGNOSIS — I10 Essential (primary) hypertension: Secondary | ICD-10-CM

## 2023-12-31 DIAGNOSIS — I5032 Chronic diastolic (congestive) heart failure: Secondary | ICD-10-CM

## 2023-12-31 LAB — BASIC METABOLIC PANEL WITH GFR
BUN/Creatinine Ratio: 13 (ref 12–28)
BUN: 22 mg/dL (ref 8–27)
CO2: 19 mmol/L — ABNORMAL LOW (ref 20–29)
Calcium: 9.3 mg/dL (ref 8.7–10.3)
Chloride: 106 mmol/L (ref 96–106)
Creatinine, Ser: 1.68 mg/dL — ABNORMAL HIGH (ref 0.57–1.00)
Glucose: 91 mg/dL (ref 70–99)
Potassium: 5.9 mmol/L — ABNORMAL HIGH (ref 3.5–5.2)
Sodium: 140 mmol/L (ref 134–144)
eGFR: 32 mL/min/1.73 — ABNORMAL LOW (ref >59)

## 2023-12-31 LAB — LIPID PANEL
Chol/HDL Ratio: 2.9 ratio (ref 0.0–4.4)
Cholesterol, Total: 128 mg/dL (ref 100–199)
HDL: 44 mg/dL (ref >39)
LDL Chol Calc (NIH): 69 mg/dL (ref 0–99)
Triglycerides: 75 mg/dL (ref 0–149)
VLDL Cholesterol Cal: 15 mg/dL (ref 5–40)

## 2023-12-31 NOTE — Progress Notes (Signed)
 Scr has improved from 2.04 > 1.68 mg/dL, since ED visit on 89/82/74. However, patient now has mild hyperkalemia with K of 5.9 mEq/L. Confirmed yesterday that she is no longer taking telmisartan . However, was taking spironolactone  25 mg daily and did not have torsemide  20 mg daily with her. Would like for patient to hold spironolactone  25 mg daily x1 week, and resume torsemide  20 mg daily, then return for repeat BMP in ~1 week. If K is normalized, can consider restarting spironolactone  at 12.5 mg for HF and HTN. Attempted to contact patient x2 without success. VM not set up. Will re-attempt tomorrow.   Lorain Baseman, PharmD [facility] [phone]

## 2023-12-31 NOTE — Progress Notes (Signed)
 12/31/2023 Name: Vanessa Palmer MRN: [id number] DOB: [birth year]  Patient is appearing for a follow-up visit with the population health pharmacy technician. Last engaged with the clinical pharmacist to discuss hypertension, heart failure, and hyperlipidemia on 12/30/2023. Contacted patient today to discuss medication access.   Plan from last clinical pharmacist appointment:  Heart Failure with preserved EF (60-65% in 2021, with G1DD): - Currently appropriately managed. She does have some chronic LEE, so it is difficult to assess fluid status. Her BP appears to be stable and close to goal <130/80 mmHg since discontinuing telmisartan  due to concern for angioedema and switching from metoprolol  to carvedilol . She continues to be a poor candidate for SGLT2i with elevated risk of GU infections. She states she is currently taking hydralazine , which was discontinued after July 2025 hospitalization. However, she appears to be tolerating without issue. In the ED on 12/23/23, Scr was elevated > 2 mg/g and eGFR was < 30 mL/min. Potassium has been WNL. Will recheck BMP today to assess appropriateness of continuing spironolactone .  - Reviewed appropriate blood pressure monitoring technique and reviewed goal blood pressure - Reviewed to weigh daily and when to contact cardiology with weight gain - Reviewed dietary modifications including restricting salt and fluid intake - Recommend to monitor blood pressure at home and keep log to review at next appointment - Recommend to continue current regimen  Current medications:  ACEi/ARB/ARNI: C/I due to angioedema while on an ACEi and ARB SGLT2i: none - urinary incontinence, high risk for GU infections Beta blocker: carvedilol  12.5 mg BID - advised to take BID as prescribed Mineralocorticoid Receptor Antagonist: spironolactone  25 mg daily Diuretic regimen: torsemide  20 mg daily Other BP medications: amlodipine  10 mg daily, hydralazine  100 mg BID - Obtained BMP today  to monitor Scr. If eGFR persistently < 30 mL/min, may have to discontinue spironolactone . Will follow-up labs. Hyperlipidemia/ASCVD Risk Reduction: - Currently controlled with most recent LDL-C of 55 mg/dL below goal < 70 mg/dL given age + comorbidities (ASCVD > stable angina). High intensity statin indicated.  - Reviewed long term complications of uncontrolled cholesterol - Recommend to continue atorvastatin  40 mg daily - Recommend to  continue Xarelto  20 mg daily for DVT prevention/stable CAD. Confirmed that patient is taking with a larger meal. - Yearly lipid panel obtained today Medication Management: - Completely medication reconciliation today as patient did bring her medications with her. She did not have torsemide  or tolterodine  with her today. She did have hydralazine  50 mg tablets (taking 100 mg BID) which was not currently on her medication list. However, she was advised to continue this medication in the setting of controlled BP without s/sx of hypotension.  - Will call pharmacy to request refill of torsemide  and ensure that telmisartan  is discontinued - Will defer to PCP to discuss whether it is appropriate for patient to resume tolterodine  for urinary incontinence. -Written patient instructions provided. Patient verbalized understanding of treatment plan.  -Follow Up Plan:  Pharmacist telephone 02/17/24 PCP clinic visit 02/04/24(copy/paste from last note)   Medication Adherence Barriers Identified:  Access issues with any new medication or testing device: Yes Torsemide   Medication Adherence Barriers Addressed/Actions Taken:  Reviewed medication changes per plan from last clinical pharmacist note Medication Access for Torsemide  Contacted pharmacy regarding new prescriptions Called [facility] pharmacy and spoke to Moody who informs he will process refill for Torsemide  20mg  and it will be ready for pick up Thursday after 10:30am. Inquired if it could be picked up earlier and he  transferred me  to the pharmacist. Spoke to pharmacist who informs the refill has been processed and insurance paid the claim and therefore medication would be ready later today. Pharmacist informs patient will receive a text message today as to what time patient can pick it up. Also inquired if Gio could discontinue the Telmisartan  and Hines informs he took care of that request as well today. Pharmacist informs patient will be outreached later today to discuss labs and pharmacist will inform patient that medication can be picked up at pharmacy.    Next clinical pharmacist appointment is scheduled for: 12/31/2023  Lucca Greggs, CPhT [facility] Office: [phone] Email: [email or web address]

## 2024-01-01 ENCOUNTER — Telehealth: Payer: Self-pay | Admitting: Internal Medicine

## 2024-01-01 NOTE — Telephone Encounter (Signed)
 Patient returned my call. She was instructed to hold spironolactone  25 mg daily, resume torsemide  20 mg daily, and return for a repeat BMP on 01/08/24. Patient reports she would pick up refill of torsemide  this afternoon. Verbalized understanding of plan. Will follow-up further management after reviewing repeat labs.   Lorain Baseman, PharmD [facility] [phone]

## 2024-01-01 NOTE — Telephone Encounter (Signed)
 Returned patient's call. See Result note from 12/31/23.   Lorain Baseman, PharmD [facility] [phone]

## 2024-01-01 NOTE — Telephone Encounter (Signed)
 Copied from CRM [id number]. Topic: General - Other >> Jan 01, 2024  9:17 AM Tobias CROME wrote: Reason for CRM: Patient returned pharmacist's call. Requesting callback: [phone]

## 2024-01-01 NOTE — Addendum Note (Signed)
 Addended by: BRINDA LORAIN SQUIBB on: 01/01/2024 09:47 AM   Modules accepted: Orders

## 2024-01-02 ENCOUNTER — Other Ambulatory Visit: Payer: Self-pay | Admitting: Internal Medicine

## 2024-01-02 MED ORDER — OXYCODONE-ACETAMINOPHEN 10-325 MG PO TABS: 1.0000 | ORAL_TABLET | Freq: Two times a day (BID) | ORAL | 0 refills | Status: DC | PRN

## 2024-01-02 NOTE — Telephone Encounter (Signed)
 Copied from CRM #[id number]. Topic: Clinical - Medication Refill >> Jan 02, 2024  9:21 AM Miquel SAILOR wrote: Medication: oxyCODONE -acetaminophen  (PERCOCET) 10-325 MG tablet   Has the patient contacted their pharmacy? Yes (Agent: If no, request that the patient contact the pharmacy for the refill. If patient does not wish to contact the pharmacy document the reason why and proceed with request.) (Agent: If yes, when and what did the pharmacy advise?)  This is the patient's preferred pharmacy:  [id number] - [place], [place] - [address] AT [facility] OF [address] [place] [address] Phone: [phone] Fax: [phone]    Is this the correct pharmacy for this prescription? Yes If no, delete pharmacy and type the correct one.   Has the prescription been filled recently? Yes  Is the patient out of the medication? No 2 pills left   Has the patient been seen for an appointment in the last year OR does the patient have an upcoming appointment? Yes  Can we respond through MyChart? Yes  Agent: Please be advised that Rx refills may take up to 3 business days. We ask that you follow-up with your pharmacy.

## 2024-01-02 NOTE — Telephone Encounter (Signed)
 Last rx written - 12/06/23. Last OV 12/09/23. Next OV - 02/04/24. TOX - 09/04/22.

## 2024-01-08 ENCOUNTER — Other Ambulatory Visit

## 2024-01-08 DIAGNOSIS — I5032 Chronic diastolic (congestive) heart failure: Secondary | ICD-10-CM

## 2024-01-08 DIAGNOSIS — I10 Essential (primary) hypertension: Secondary | ICD-10-CM

## 2024-01-09 ENCOUNTER — Ambulatory Visit: Payer: Self-pay

## 2024-01-09 LAB — BASIC METABOLIC PANEL WITH GFR
BUN/Creatinine Ratio: 13 (ref 12–28)
BUN: 32 mg/dL — ABNORMAL HIGH (ref 8–27)
CO2: 18 mmol/L — ABNORMAL LOW (ref 20–29)
Calcium: 8.8 mg/dL (ref 8.7–10.3)
Chloride: 103 mmol/L (ref 96–106)
Creatinine, Ser: 2.47 mg/dL — ABNORMAL HIGH (ref 0.57–1.00)
Glucose: 82 mg/dL (ref 70–99)
Potassium: 4.4 mmol/L (ref 3.5–5.2)
Sodium: 138 mmol/L (ref 134–144)
eGFR: 20 mL/min/1.73 — ABNORMAL LOW (ref >59)

## 2024-01-10 ENCOUNTER — Emergency Department (HOSPITAL_COMMUNITY)

## 2024-01-10 ENCOUNTER — Other Ambulatory Visit: Payer: Self-pay

## 2024-01-10 ENCOUNTER — Emergency Department (HOSPITAL_COMMUNITY)
Admission: EM | Admit: 2024-01-10 | Discharge: 2024-01-11 | Disposition: A | Discharge status: Home / Self Care | Attending: Emergency Medicine | Admitting: Emergency Medicine

## 2024-01-10 DIAGNOSIS — N1832 Chronic kidney disease, stage 3b: Secondary | ICD-10-CM | POA: Insufficient documentation

## 2024-01-10 DIAGNOSIS — R112 Nausea with vomiting, unspecified: Secondary | ICD-10-CM | POA: Insufficient documentation

## 2024-01-10 DIAGNOSIS — R1013 Epigastric pain: Secondary | ICD-10-CM | POA: Diagnosis present

## 2024-01-10 DIAGNOSIS — J45909 Unspecified asthma, uncomplicated: Secondary | ICD-10-CM | POA: Diagnosis not present

## 2024-01-10 DIAGNOSIS — Z96641 Presence of right artificial hip joint: Secondary | ICD-10-CM | POA: Diagnosis not present

## 2024-01-10 DIAGNOSIS — I700 Atherosclerosis of aorta: Secondary | ICD-10-CM | POA: Diagnosis not present

## 2024-01-10 DIAGNOSIS — I130 Hypertensive heart and chronic kidney disease with heart failure and stage 1 through stage 4 chronic kidney disease, or unspecified chronic kidney disease: Secondary | ICD-10-CM | POA: Diagnosis not present

## 2024-01-10 DIAGNOSIS — W19XXXA Unspecified fall, initial encounter: Secondary | ICD-10-CM

## 2024-01-10 DIAGNOSIS — Z79899 Other long term (current) drug therapy: Secondary | ICD-10-CM | POA: Diagnosis present

## 2024-01-10 DIAGNOSIS — M1990 Unspecified osteoarthritis, unspecified site: Secondary | ICD-10-CM | POA: Diagnosis not present

## 2024-01-10 DIAGNOSIS — Z7901 Long term (current) use of anticoagulants: Secondary | ICD-10-CM | POA: Diagnosis not present

## 2024-01-10 DIAGNOSIS — I509 Heart failure, unspecified: Secondary | ICD-10-CM | POA: Insufficient documentation

## 2024-01-10 DIAGNOSIS — G8929 Other chronic pain: Secondary | ICD-10-CM | POA: Diagnosis not present

## 2024-01-10 DIAGNOSIS — Z86718 Personal history of other venous thrombosis and embolism: Secondary | ICD-10-CM | POA: Diagnosis not present

## 2024-01-10 DIAGNOSIS — I2510 Atherosclerotic heart disease of native coronary artery without angina pectoris: Secondary | ICD-10-CM | POA: Diagnosis not present

## 2024-01-10 DIAGNOSIS — I890 Lymphedema, not elsewhere classified: Secondary | ICD-10-CM | POA: Diagnosis not present

## 2024-01-10 DIAGNOSIS — E785 Hyperlipidemia, unspecified: Secondary | ICD-10-CM | POA: Diagnosis not present

## 2024-01-10 DIAGNOSIS — K219 Gastro-esophageal reflux disease without esophagitis: Secondary | ICD-10-CM | POA: Diagnosis not present

## 2024-01-10 DIAGNOSIS — K5730 Diverticulosis of large intestine without perforation or abscess without bleeding: Secondary | ICD-10-CM | POA: Diagnosis not present

## 2024-01-10 LAB — I-STAT CHEM 8, ED
BUN: 37 mg/dL — ABNORMAL HIGH (ref 8–23)
Calcium, Ion: 1.08 mmol/L — ABNORMAL LOW (ref 1.15–1.40)
Chloride: 105 mmol/L (ref 98–111)
Creatinine, Ser: 2.9 mg/dL — ABNORMAL HIGH (ref 0.44–1.00)
Glucose, Bld: 127 mg/dL — ABNORMAL HIGH (ref 70–99)
HCT: 28 % — ABNORMAL LOW (ref 36.0–46.0)
Hemoglobin: 9.5 g/dL — ABNORMAL LOW (ref 12.0–15.0)
Potassium: 4.3 mmol/L (ref 3.5–5.1)
Sodium: 139 mmol/L (ref 135–145)
TCO2: 21 mmol/L — ABNORMAL LOW (ref 22–32)

## 2024-01-10 LAB — CBC
HCT: 28.3 % — ABNORMAL LOW (ref 36.0–46.0)
Hemoglobin: 9.1 g/dL — ABNORMAL LOW (ref 12.0–15.0)
MCH: 29.9 pg (ref 26.0–34.0)
MCHC: 32.2 g/dL (ref 30.0–36.0)
MCV: 93.1 fL (ref 80.0–100.0)
Platelets: 177 K/uL (ref 150–400)
RBC: 3.04 MIL/uL — ABNORMAL LOW (ref 3.87–5.11)
RDW: 15.3 % (ref 11.5–15.5)
WBC: 4.9 K/uL (ref 4.0–10.5)
nRBC: 0 % (ref 0.0–0.2)

## 2024-01-10 LAB — COMPREHENSIVE METABOLIC PANEL WITH GFR
ALT: 11 U/L (ref 0–44)
AST: 17 U/L (ref 15–41)
Albumin: 3.5 g/dL (ref 3.5–5.0)
Alkaline Phosphatase: 112 U/L (ref 38–126)
Anion gap: 15 (ref 5–15)
BUN: 34 mg/dL — ABNORMAL HIGH (ref 8–23)
CO2: 21 mmol/L — ABNORMAL LOW (ref 22–32)
Calcium: 8.6 mg/dL — ABNORMAL LOW (ref 8.9–10.3)
Chloride: 103 mmol/L (ref 98–111)
Creatinine, Ser: 2.76 mg/dL — ABNORMAL HIGH (ref 0.44–1.00)
GFR, Estimated: 18 mL/min — ABNORMAL LOW (ref >60)
Glucose, Bld: 129 mg/dL — ABNORMAL HIGH (ref 70–99)
Potassium: 4.4 mmol/L (ref 3.5–5.1)
Sodium: 139 mmol/L (ref 135–145)
Total Bilirubin: 0.7 mg/dL (ref 0.0–1.2)
Total Protein: 7.3 g/dL (ref 6.5–8.1)

## 2024-01-10 LAB — SAMPLE TO BLOOD BANK

## 2024-01-10 LAB — PROTIME-INR
INR: 2.3 — ABNORMAL HIGH (ref 0.8–1.2)
Prothrombin Time: 26.9 s — ABNORMAL HIGH (ref 11.4–15.2)

## 2024-01-10 LAB — I-STAT CG4 LACTIC ACID, ED: Lactic Acid, Venous: 0.6 mmol/L (ref 0.5–1.9)

## 2024-01-10 LAB — ETHANOL: Alcohol, Ethyl (B): <15 mg/dL (ref <15)

## 2024-01-10 LAB — LIPASE, BLOOD: Lipase: 27 U/L (ref 11–51)

## 2024-01-10 MED ORDER — METHOCARBAMOL 1000 MG/10ML IJ SOLN
500.0000 mg | Freq: Once | INTRAMUSCULAR | Status: AC
Start: 2024-01-10 — End: 2024-01-10
  Administered 2024-01-10: 500 mg via INTRAVENOUS
  Filled 2024-01-10: qty 10

## 2024-01-10 MED ORDER — ONDANSETRON HCL 4 MG PO TABS: 4.0000 mg | ORAL_TABLET | Freq: Three times a day (TID) | ORAL | 0 refills | Status: AC | PRN

## 2024-01-10 MED ORDER — LACTATED RINGERS IV BOLUS
500.0000 mL | Freq: Once | INTRAVENOUS | Status: AC
  Administered 2024-01-10: 500 mL via INTRAVENOUS

## 2024-01-10 MED ORDER — IOHEXOL 350 MG/ML SOLN
75.0000 mL | Freq: Once | INTRAVENOUS | Status: AC | PRN
  Administered 2024-01-10: 75 mL via INTRAVENOUS

## 2024-01-10 MED ORDER — ONDANSETRON HCL 4 MG/2ML IJ SOLN
4.0000 mg | Freq: Once | INTRAMUSCULAR | Status: AC
  Administered 2024-01-10: 4 mg via INTRAVENOUS
  Filled 2024-01-10: qty 2

## 2024-01-10 NOTE — Discharge Instructions (Addendum)
 Vanessa Palmer:  Thank you for allowing us  to take care of you today.  We hope you begin feeling better soon. You were seen today for nausea, vomiting as well as a fall.  While you are here we performed a physical examination, lab work as well as CT imaging.  There is no emergent cause of your symptoms found today and no traumatic injuries found.  I have prescribed you Zofran  to take as needed for nausea and vomiting (you may take it every 6 hours as needed).  Please follow-up with your PCP within the next 3 to 5 days for repeat kidney function test.  If your symptoms continue to worsen, please return to the emergency department or follow-up with your PCP ASAP for further evaluation.  To-Do:  Please follow-up with your primary doctor within the next 2-3 days. It is important that you review any labs or imaging results (if any) that you had today with them. Your preliminary imaging results (if any) are attached. Please return to the Emergency Department or call 911 if you experience chest pain, shortness of breath, severe pain, severe fever, altered mental status, or have any reason to think that you need emergency medical care.  Thank you again.  Hope you feel better soon. Department of Emergency Medicine

## 2024-01-10 NOTE — ED Triage Notes (Signed)
 Pt BIB [facility] from home c/o unwitnessed fall. Pt uses motorized wheelchair at baseline and became nauseous after eating. Pt went to stand up to go to bathroom and fell. Unknown LOC. Pt takes Xarelto .

## 2024-01-10 NOTE — Progress Notes (Signed)
   01/10/24 2118  Spiritual Encounters  Type of Visit Initial  Care provided to: Patient  Conversation partners present during encounter Nurse  Reason for visit Trauma  OnCall Visit Yes   Responded to trauma page level 2. Patient felt at home after becoming weak and vomiting. Called patient's daughter Vanessa Palmer and advised her of situation per patient. Daughter does not drive and will not be visiting hospital, however she wants to be kept in informed. Provided support.

## 2024-01-10 NOTE — ED Provider Notes (Signed)
 [place] EMERGENCY DEPARTMENT AT [facility] Provider Note  MDM   HPI/ROS:  Vanessa Palmer is a 73 y.o. female with a PMH of HTN, CAD, CKD stage IIIb, DVT, GERD, HLD, asthma, lymphedema, urinary incontinence, osteoarthritis, chronic pain, MDD who presents as a level 2 trauma following a fall.  History is obtained primarily from EMS as well as patient.  Patient was BIBEMS from home after a ground-level fall while on blood thinners (Xarelto ).  Patient uses a motorized wheelchair at baseline, became nauseous after eating, went to stand up to go to the bathroom and had a mechanical fall.  She had 2 episodes of emesis and route that was nonbloody and nonbilious.  She did not complaining of any abdominal pain.  He arrives GCS of 15, ABCs intact and hemodynamically stable.  Physical exam is notable for: -Significant bilateral pitting edema,  signal DP pulses bilaterally.  No external signs of trauma, neurologic exam is nonfocal and unremarkable, strength is 5/5 in the bilateral upper and lower extremities.  Sensation is intact. On my initial evaluation, patient is:  -Vital signs stable. Patient afebrile, hemodynamically stable, and non-toxic appearing.  Plan for basic labs, x-ray and CT imaging to evaluate for any traumatic injuries.  Additionally will have CT abdomen/pelvis with contrast to evaluate for any intra-abdominal pathology given patient's onset of nausea and vomiting.  Her abdominal exam is reassuring without any specific focality or peritonitis, abdomen is nondistended and nonrigid. Creatinine 2.76 which is largely unchanged from 2 days ago 2.47.  Hemoglobin at 9.1 which is stable patient's baseline.  Lactic acid is insignificant at 0.6.  CT head, C-spine and x-rays of the chest and pelvis are all negative for any acute traumatic or acute abnormalities. CT CAP without intrathoracic intra-abdominal or intrapelvic abnormalities.  She does have chronic diverticular disease without any  acute inflammatory process.  She has an ascending aorta diameter of 4.1.  On review of past records, she has had dilated ascending aorta without near abnormalities, patient was made aware of this and encouraged on imaging follow-up with CTA.  At this time, no acute abnormalities or traumatic injuries therefore no indication for trauma consultation.  Additionally, no acute emergent abnormality within the abdomen causing nausea/vomiting.  Additionally no white count significant for infection, no electrolyte derangements   Interpretations, interventions, and the patient's course of care are documented below.    Clinical Course as of 01/10/24 2200  Fri Jan 10, 2024  2108 Creatinine is 2.76, Up from 2.47 2 days ago, baseline is 1.68.  Hemoglobin at 9.1 which is at patient's baseline, lactic acid insignificant at 0.6. [SA]  2145 CT head, CT C-spine, pelvis and chest x-ray all negative for any acute traumatic or acute abnormalities. [SA]    Clinical Course User Index [SA] Billy Pal, MD     Disposition:  I discussed the plan for discharge with the patient and/or their surrogate at bedside prior to discharge and they were in agreement with the plan and verbalized understanding of the return precautions provided. All questions answered to the best of my ability. Ultimately, the patient was discharged in stable condition with stable vital signs. I am reassured that they are capable of close follow up and good social support at home.   Clinical Impression: No diagnosis found.  Rx / DC Orders ED Discharge Orders          Ordered    ondansetron  (ZOFRAN ) 4 MG tablet  Every 8 hours PRN  01/10/24 2212            Clinical Complexity A medically appropriate history, review of systems, and physical exam was performed.  My independent interpretations of EKG, labs, and radiology are documented in the ED course above.   If decision rules were used in this patient's evaluation, they are  listed below.   Click here for ABCD2, HEART and other calculatorsREFRESH Note before signing   Patient's presentation is most consistent with acute complicated illness / injury requiring diagnostic workup.  Medical Decision Making Amount and/or Complexity of Data Reviewed Labs: ordered. Radiology: ordered.  Risk Prescription drug management.    HPI/ROS      See MDM section for pertinent HPI and ROS. A complete ROS was performed with pertinent positives/negatives noted above.   Past Medical History:  Diagnosis Date   Acute renal failure superimposed on stage 3b chronic kidney disease, unspecified acute renal failure type (HCC) 09/09/2023   ALCOHOL ABUSE 12/13/2005   Annotation: Sober since 11/06 Qualifier: Diagnosis of  By: Elnor MD, Comer     Anemia    Arthritis    all over (02/21/2017)   CAD (coronary artery disease)    s/p non-Q wave MI in 06/2001 and 2004, 2005   CHF (congestive heart failure) (HCC)    Chronic kidney disease (CKD), stage III (moderate) (HCC) 09/19/2010   Chronic mid back pain    Depression    DJD (degenerative joint disease)    DVT (deep venous thrombosis) (HCC)    BLE   DVT of lower extremity, bilateral (HCC) 04/04/2012   On Xarelto      GERD (gastroesophageal reflux disease)    Gout    History of alcohol abuse 12/13/2005   Annotation: Sober since 11/06           Hyperlipidemia    Hypertension    INTRINSIC ASTHMA, WITH EXACERBATION 09/27/2009   Qualifier: Diagnosis of  By: Danella MD, Nodira     Migraine    none anymore (02/21/2017)   Myocardial infarction [facility]) ?2005   Obesity    OSA (obstructive sleep apnea)    previously used CPAP, has lost it (02/21/2017)   Pressure injury of skin 09/09/2023   Rhabdomyolysis 09/09/2023   Seizures (HCC)    As a teenager    Traumatic rhabdomyolysis 09/09/2023    Past Surgical History:  Procedure Laterality Date   ABDOMINAL HYSTERECTOMY     partial; due to endometriosis   CARDIAC  CATHETERIZATION  06/2001, 02/2002, 10/2004    (10-15% proximal stenosis of circumflex, diffuse disease of  OM1 and RCA)   CARDIAC CATHETERIZATION  02/21/2017   COLONOSCOPY     JOINT REPLACEMENT     KNEE ARTHROSCOPY Left    LEFT HEART CATH AND CORONARY ANGIOGRAPHY N/A 02/21/2017   Procedure: LEFT HEART CATH AND CORONARY ANGIOGRAPHY;  Surgeon: Levern Hutching, MD;  Location: [facility] INVASIVE CV LAB;  Service: Cardiovascular;  Laterality: N/A;   TOTAL HIP ARTHROPLASTY Right 11/23/2014   Procedure: RIGHT TOTAL HIP ARTHROPLASTY ANTERIOR APPROACH;  Surgeon: Lonni CINDERELLA Poli, MD;  Location: [facility] OR;  Service: Orthopedics;  Laterality: Right;   TUBAL LIGATION        Physical Exam   Vitals:   01/10/24 2022 01/10/24 2027  BP:  110/60  SpO2: 96%     Physical Exam Vitals and nursing note reviewed.  Constitutional:      General: She is not in acute distress.    Appearance: She is well-developed.  HENT:  Head: Normocephalic and atraumatic.  Eyes:     Conjunctiva/sclera: Conjunctivae normal.  Cardiovascular:     Rate and Rhythm: Normal rate and regular rhythm.     Heart sounds: No murmur heard. Pulmonary:     Effort: Pulmonary effort is normal. No respiratory distress.     Breath sounds: Normal breath sounds.  Abdominal:     Palpations: Abdomen is soft.     Tenderness: There is no abdominal tenderness.  Musculoskeletal:        General: No swelling.     Cervical back: Neck supple.  Skin:    General: Skin is warm and dry.     Capillary Refill: Capillary refill takes less than 2 seconds.  Neurological:     General: No focal deficit present.     Mental Status: She is alert and oriented to person, place, and time. Mental status is at baseline.     Comments: neurologic exam is nonfocal and unremarkable, strength is 5/5 in the bilateral upper and lower extremities.  Sensation is intact.      Procedures   If procedures were preformed on this patient, they are listed below:   Procedures   Please note that this documentation was produced with the assistance of voice-to-text technology and may contain errors.     Billy Pal, MD 01/10/24 [id number]    Garrick Charleston, MD 01/10/24 2229

## 2024-01-11 NOTE — ED Notes (Signed)
 Pt resting in bed, awaiting Ptar and discharge home.

## 2024-01-11 NOTE — ED Notes (Signed)
 Beatrix (legal guardian & granddaughter) was called about discharging pt back home. Guardian did not pick up, voicemail was left. Pt reports she has a agricultural engineer key to apartment and will be at home to let pt inside. Pt lives alone at baseline.

## 2024-01-20 ENCOUNTER — Telehealth: Payer: Self-pay | Admitting: *Deleted

## 2024-01-20 NOTE — Telephone Encounter (Signed)
  Will forward to [facility], CMA to make sure forms have arrived for completion. .Copied from CRM [id number]. Topic: Clinical - Medical Advice >> Jan 20, 2024  2:23 PM [name] wrote: Reason for CRM: patient's  aide brought in a paperwork in person last week  from pt.  dentist  to get her teeth pull , need patient provider to sign the form for approval for patient  to get off blood thinners for 3 or 4 days , so patient can get her teeth pull, the paper was need to fax over to the dentist(pt going to call back with the name of the dentist ) >> Jan 20, 2024  2:31 PM Suzette B wrote: Patient has called back to give the dentist information that will be extracting her teeth: Vanessa Palmer [phone]

## 2024-01-21 NOTE — Telephone Encounter (Signed)
 completed

## 2024-01-28 ENCOUNTER — Telehealth: Payer: Self-pay | Admitting: *Deleted

## 2024-01-28 NOTE — Telephone Encounter (Signed)
 RTC to [facility].  Request verbal order to continue PT 1 time a week for 1 week then 2 times a week for 2 weeks and then 1 time a week for 6 weeks.  Will work on Print Production Planner, Investment banker, operational, Pain Management in back, knees and wrist.  Verbal approval given. Will forward to PCP to agree or deny.  Copied from CRM [id number]. Topic: Clinical - Home Health Verbal Orders >> Jan 28, 2024  4:39 PM DeAngela L wrote: Caller/Agency: Charisse  Physical Therapist with [facility]  Callback Number: [phone] secured line  Service Requested: Physical Therapy Frequency: 1w1 2w2 1w6 calling to extend PT  Any new concerns about the patient? No

## 2024-01-29 ENCOUNTER — Telehealth: Payer: Self-pay

## 2024-01-29 NOTE — Telephone Encounter (Signed)
 I contacted pt to let know that Dr. Rosan has completed and faxed the medical clearance for dental treatment, but no answer. Unable to leave message, vm is not setup.

## 2024-01-29 NOTE — Telephone Encounter (Signed)
 agree

## 2024-02-03 ENCOUNTER — Telehealth: Payer: Self-pay | Admitting: Internal Medicine

## 2024-02-03 NOTE — Telephone Encounter (Unsigned)
 Copied from CRM #[id number]. Topic: Clinical - Medication Refill >> Feb 03, 2024 10:37 AM Susanna ORN wrote: Medication: oxyCODONE -acetaminophen  (PERCOCET) 10-325 MG tablet  Has the patient contacted their pharmacy? No (Agent: If no, request that the patient contact the pharmacy for the refill. If patient does not wish to contact the pharmacy document the reason why and proceed with request.) (Agent: If yes, when and what did the pharmacy advise?)  This is the patient's preferred pharmacy:  [id number] - [place], [place] - [address] AT [facility] OF [address] & [address] [place] [address] Phone: [phone] Fax: [phone]  Is this the correct pharmacy for this prescription? Yes If no, delete pharmacy and type the correct one.   Has the prescription been filled recently? Yes  Is the patient out of the medication? Yes  Has the patient been seen for an appointment in the last year OR does the patient have an upcoming appointment? Yes  Can we respond through MyChart? Yes  Agent: Please be advised that Rx refills may take up to 3 business days. We ask that you follow-up with your pharmacy.

## 2024-02-04 ENCOUNTER — Ambulatory Visit: Admitting: Student

## 2024-02-05 MED ORDER — OXYCODONE-ACETAMINOPHEN 10-325 MG PO TABS: 1.0000 | ORAL_TABLET | Freq: Two times a day (BID) | ORAL | 0 refills | Status: DC | PRN

## 2024-02-06 ENCOUNTER — Other Ambulatory Visit: Payer: Self-pay

## 2024-02-06 ENCOUNTER — Ambulatory Visit: Admitting: Student

## 2024-02-06 VITALS — BP 112/78 | HR 71 | Temp 97.9°F

## 2024-02-06 DIAGNOSIS — I129 Hypertensive chronic kidney disease with stage 1 through stage 4 chronic kidney disease, or unspecified chronic kidney disease: Secondary | ICD-10-CM | POA: Diagnosis not present

## 2024-02-06 DIAGNOSIS — M5451 Vertebrogenic low back pain: Secondary | ICD-10-CM

## 2024-02-06 DIAGNOSIS — I10 Essential (primary) hypertension: Secondary | ICD-10-CM

## 2024-02-06 DIAGNOSIS — M170 Bilateral primary osteoarthritis of knee: Secondary | ICD-10-CM

## 2024-02-06 DIAGNOSIS — N1831 Chronic kidney disease, stage 3a: Secondary | ICD-10-CM

## 2024-02-06 DIAGNOSIS — Z23 Encounter for immunization: Secondary | ICD-10-CM

## 2024-02-06 DIAGNOSIS — R7303 Prediabetes: Secondary | ICD-10-CM

## 2024-02-06 LAB — POCT GLYCOSYLATED HEMOGLOBIN (HGB A1C): HbA1c, POC (prediabetic range): 5.7 % (ref 5.7–6.4)

## 2024-02-06 LAB — GLUCOSE, CAPILLARY: Glucose-Capillary: 111 mg/dL — ABNORMAL HIGH (ref 70–99)

## 2024-02-06 MED ORDER — OXYCODONE-ACETAMINOPHEN 10-325 MG PO TABS
1.0000 | ORAL_TABLET | Freq: Two times a day (BID) | ORAL | 0 refills | Status: DC | PRN
  Filled 2024-02-06: qty 60, 30d supply, fill #0

## 2024-02-06 NOTE — Progress Notes (Signed)
 Patient name: Vanessa Palmer Date of birth: 1950-09-16 Date of visit: 02/06/2024  Subjective  Reason for visit: Leg Pain (Pt has leg pain and pharmacy does not have pain medication. )  Discussed the use of AI scribe software for clinical note transcription with the patient, who gave verbal consent to proceed.  History of Present Illness   Vanessa Palmer is a 73 year old female who presents with worsening knee and back pain due to medication unavailability.  Musculoskeletal pain - Severe chronic knee and back pain, worsened today due to unavailability of usual pain medication at the pharmacy - Pain exacerbated by cold weather - Usual pain medication taken as needed, approximately twice daily for years, without withdrawal symptoms when not taken - Pain today prevents use of walker, requiring wheelchair for mobility - Long-standing lower extremity swelling - Fall occurred last month, no subsequent falls  Functional status - Requires assistance from home aide for dressing and bathroom use - Currently unable to ambulate with walker due to pain  Renal function - Follows with nephrology - Recent creatinine 2.47 mg/dL and eGFR 20 fO/fpw/8.26f7 on January 08, 2024 - No recent change in urination, but has urinary frequency - Nephrology follow-up scheduled for next year  Respiratory status - Breathing is stable  Preoperative status - Preparing for dental surgery       Outpatient Medications Prior to Visit  Medication Sig   amLODipine  (NORVASC ) 10 MG tablet Take 1 tablet (10 mg total) by mouth daily.   atorvastatin  (LIPITOR ) 40 MG tablet Take 1 tablet (40 mg total) by mouth daily.   budesonide -formoterol  (SYMBICORT ) 160-4.5 MCG/ACT inhaler Inhale 2 puffs into the lungs 3 times/day as needed-between meals & bedtime.   carvedilol  (COREG ) 12.5 MG tablet Take 1 tablet (12.5 mg total) by mouth 2 (two) times daily with a meal.   cyclobenzaprine  (FLEXERIL ) 10 MG tablet Take 1 tablet (10  mg total) by mouth 2 (two) times daily as needed for muscle spasms.   diclofenac  Sodium (VOLTAREN  ARTHRITIS PAIN) 1 % GEL Apply 2 g topically 4 (four) times daily.   EPINEPHrine  0.3 mg/0.3 mL IJ SOAJ injection Inject 0.3 mg into the muscle as needed for anaphylaxis.   feeding supplement (ENSURE PLUS HIGH PROTEIN) LIQD Take 237 mLs by mouth 2 (two) times daily between meals.   gabapentin  (NEURONTIN ) 300 MG capsule TAKE 1 CAPSULE(300 MG) BY MOUTH THREE TIMES DAILY   hydrALAZINE  (APRESOLINE ) 50 MG tablet Take 100 mg by mouth in the morning and at bedtime.   nitroGLYCERIN  (NITROSTAT ) 0.4 MG SL tablet Place 1 tablet (0.4 mg total) under the tongue every 5 (five) minutes as needed for chest pain.   ondansetron  (ZOFRAN ) 4 MG tablet Take 1 tablet (4 mg total) by mouth every 8 (eight) hours as needed for nausea or vomiting.   oxyCODONE -acetaminophen  (PERCOCET) 10-325 MG tablet Take 1 tablet by mouth every 12 (twelve) hours as needed.   pantoprazole  (PROTONIX ) 40 MG tablet Take 1 tablet (40 mg total) by mouth daily.   rivaroxaban  (XARELTO ) 20 MG TABS tablet TAKE ONE TABLET BY MOUTH DAILY WITH SUPPER   sertraline  (ZOLOFT ) 100 MG tablet Take 1 tablet (100 mg total) by mouth daily.   tolterodine  (DETROL  LA) 2 MG 24 hr capsule Take 1 capsule (2 mg total) by mouth daily. (Patient not taking: Reported on 12/30/2023)   torsemide  (DEMADEX ) 20 MG tablet Take 1 tablet (20 mg total) by mouth daily.   No facility-administered medications prior to visit.  Objective  Today's Vitals   02/06/24 1453  BP: 112/78  Pulse: 71  Temp: 97.9 F (36.6 C)  TempSrc: Oral  SpO2: 96%  PainSc: 10-Worst pain ever  PainLoc: Leg  There is no height or weight on file to calculate BMI.   Physical Exam Constitutional:      Appearance: Normal appearance.  Cardiovascular:     Rate and Rhythm: Normal rate and regular rhythm.     Pulses: Normal pulses.     Comments: No JVD while upright. Pulmonary:     Effort: Pulmonary  effort is normal. No respiratory distress.     Breath sounds: No rales.  Musculoskeletal:     Comments: Bilateral woody edema in both legs.  Bilateral knee tenderness.  Hyperalgesia over the low back.  Skin:    General: Skin is warm and dry.  Neurological:     Mental Status: She is alert.     Cranial Nerves: No facial asymmetry.  Psychiatric:        Mood and Affect: Affect normal.        Speech: Speech normal.        Behavior: Behavior normal.   Results   LABS Creatinine: 2.47 mg/dL (88/87/7974) Estimated GFR: 20 mL/min/1.73 m (01/08/2024)       Assessment & Plan Osteoarthritis of both knees, unspecified osteoarthritis type Chronic, stable.  Unfortunately has been without her Percocet 10-325 for the last couple days because her pharmacy does not have it in stock.  Checked and [facility] has it so I called her other pharmacy to cancel the prior prescription and sent her Percocet to pharmacy across the hall. Orders:   oxyCODONE -acetaminophen  (PERCOCET) 10-325 MG tablet; Take 1 tablet by mouth every 12 (twelve) hours as needed.  Vertebrogenic low back pain Chronic and stable, due to arthritic changes.  She has hyperalgesia on exam. Orders:   oxyCODONE -acetaminophen  (PERCOCET) 10-325 MG tablet; Take 1 tablet by mouth every 12 (twelve) hours as needed.  Essential hypertension BP Readings from Last 3 Encounters:  02/06/24 112/78  01/11/24 101/62  12/30/23 131/66   Chronic, at goal.  Continue amlodipine  10 mg daily, carvedilol  12.5 mg daily, hydralazine  100 mg twice daily.  Spironolactone  was recently discontinued for worsening renal function. Orders:   Basic metabolic panel with GFR   Stage 3a chronic kidney disease (HCC) Creatinine, Ser  Date Value Ref Range Status  01/10/2024 2.90 (H) 0.44 - 1.00 mg/dL Final  88/85/7974 7.23 (H) 0.44 - 1.00 mg/dL Final  88/87/7974 7.52 (H) 0.57 - 1.00 mg/dL Final   eGFR  Date Value Ref Range Status   01/08/2024 20 (L) >59 mL/min/1.73 Final  12/30/2023 32 (L) >59 mL/min/1.73 Final  10/29/2023 31 (L) >59 mL/min/1.73 Final   Microalb/Creat Ratio  Date Value Ref Range Status  08/24/2020 123 (H) 0 - 29 mg/g creat Final    Comment:                           Normal:                0 -  29                        Moderately increased: 30 - 300                        Severely increased:       >  300    Microalb Creat Ratio  Date Value Ref Range Status  09/27/2009 79.7 (H) (0.0-30.0) mg/g Final  06/04/2007 323.9 (H) 0.0 - 30.0 mg/g Final   Chronic, progressively worsening renal function.  Query hypertensive nephrosclerosis.  Kidney failure risk estimated to be 10.66% at 2 years, 29.67% at 5 years.  Recently established with nephrology.  BP well-controlled today.  Update urine microalbumin creatinine ratio.  Orders:   Basic metabolic panel with GFR   Microalbumin / creatinine urine ratio; Future  Prediabetes  Orders:   POCT glycosylated hemoglobin (Hb A1C)  Encounter for immunization  Orders:   Flu vaccine HIGH DOSE PF(Fluzone Trivalent)  Return in about 3 months (around 05/06/2024) for chronic condition management, or sooner if needed.  [name] MD 02/06/2024, 4:09 PM

## 2024-02-06 NOTE — Patient Instructions (Signed)
 VISIT SUMMARY: You visited us  today due to worsening knee and back pain because your usual pain medication was unavailable. We discussed your chronic pain, kidney function, and blood pressure management. You also received your flu shot.  YOUR PLAN: OSTEOARTHRITIS AND CHRONIC PAIN: You have chronic pain in your knees and lower back, which has worsened due to the unavailability of your usual pain medication. -We transferred your pain medication prescription to [facility]. If it is not available there, we will cancel the current prescription and send it to another pharmacy. -Ensure you have your pain medication before leaving the clinic.  STAGE 3A CHRONIC KIDNEY DISEASE: Your kidney function is being monitored, and your recent creatinine level was 2.47 with an estimated GFR of 20. -Keep your blood pressure controlled. -We requested your nephrologist to send your records to us .  ESSENTIAL HYPERTENSION: Your blood pressure is well-controlled. -Continue with your current blood pressure management regimen.  GENERAL HEALTH MAINTENANCE: You were due for your flu vaccination. -You received your flu shot today.  Take your prescription medications as usual on the day of your doctor visit. Unless specifically instructed, there is no need to fast prior to laboratory blood testing.  Bring all of the medications that you take (including over the counter medications and supplements) with you to every clinic visit.  This after visit summary is an important review of tests, referrals, and medication changes that were discussed during your visit. If you have questions or concerns, call [phone]. Outside of clinic business hours, call the main hospital at [phone] and ask the operator for the on-call internal medicine resident.   [name] MD 02/06/2024, 3:27 PM

## 2024-02-06 NOTE — Assessment & Plan Note (Addendum)
 Creatinine, Ser  Date Value Ref Range Status  01/10/2024 2.90 (H) 0.44 - 1.00 mg/dL Final  88/85/7974 7.23 (H) 0.44 - 1.00 mg/dL Final  88/87/7974 7.52 (H) 0.57 - 1.00 mg/dL Final   eGFR  Date Value Ref Range Status  01/08/2024 20 (L) >59 mL/min/1.73 Final  12/30/2023 32 (L) >59 mL/min/1.73 Final  10/29/2023 31 (L) >59 mL/min/1.73 Final   Microalb/Creat Ratio  Date Value Ref Range Status  08/24/2020 123 (H) 0 - 29 mg/g creat Final    Comment:                           Normal:                0 -  29                        Moderately increased: 30 - 300                        Severely increased:       >300    Microalb Creat Ratio  Date Value Ref Range Status  09/27/2009 79.7 (H) (0.0-30.0) mg/g Final  06/04/2007 323.9 (H) 0.0 - 30.0 mg/g Final   Chronic, progressively worsening renal function.  Query hypertensive nephrosclerosis.  Kidney failure risk estimated to be 10.66% at 2 years, 29.67% at 5 years.  Recently established with nephrology.  BP well-controlled today.  Update urine microalbumin creatinine ratio.  Orders:   Basic metabolic panel with GFR   Microalbumin / creatinine urine ratio; Future

## 2024-02-06 NOTE — Assessment & Plan Note (Addendum)
 Chronic, stable.  Unfortunately has been without her Percocet 10-325 for the last couple days because her pharmacy does not have it in stock.  Checked and [facility] has it so I called her other pharmacy to cancel the prior prescription and sent her Percocet to pharmacy across the hall. Orders:   oxyCODONE -acetaminophen  (PERCOCET) 10-325 MG tablet; Take 1 tablet by mouth every 12 (twelve) hours as needed.

## 2024-02-06 NOTE — Assessment & Plan Note (Addendum)
 BP Readings from Last 3 Encounters:  02/06/24 112/78  01/11/24 101/62  12/30/23 131/66   Chronic, at goal.  Continue amlodipine  10 mg daily, carvedilol  12.5 mg daily, hydralazine  100 mg twice daily.  Spironolactone  was recently discontinued for worsening renal function. Orders:   Basic metabolic panel with GFR

## 2024-02-06 NOTE — Assessment & Plan Note (Addendum)
 Chronic and stable, due to arthritic changes.  She has hyperalgesia on exam. Orders:   oxyCODONE -acetaminophen  (PERCOCET) 10-325 MG tablet; Take 1 tablet by mouth every 12 (twelve) hours as needed.

## 2024-02-07 ENCOUNTER — Ambulatory Visit: Payer: Self-pay | Admitting: Student

## 2024-02-07 ENCOUNTER — Telehealth: Payer: Self-pay | Admitting: Orthopaedic Surgery

## 2024-02-07 LAB — BASIC METABOLIC PANEL WITH GFR
BUN/Creatinine Ratio: 13 (ref 12–28)
BUN: 25 mg/dL (ref 8–27)
CO2: 22 mmol/L (ref 20–29)
Calcium: 8.7 mg/dL (ref 8.7–10.3)
Chloride: 104 mmol/L (ref 96–106)
Creatinine, Ser: 1.98 mg/dL — ABNORMAL HIGH (ref 0.57–1.00)
Glucose: 108 mg/dL — ABNORMAL HIGH (ref 70–99)
Potassium: 5 mmol/L (ref 3.5–5.2)
Sodium: 140 mmol/L (ref 134–144)
eGFR: 27 mL/min/1.73 — ABNORMAL LOW (ref >59)

## 2024-02-07 NOTE — Telephone Encounter (Signed)
 Anisha from [facility] called wanting to know if the pt can be taken off Cyclobenzaprine  and Percocet, and Tolterodine  due to risk for blurry vision, confusion, fall. She wants to know if there are any alternates for these due to these reasons and if not, the documentation of why or clinical reason as to why. Call back number is [phone].

## 2024-02-10 NOTE — Telephone Encounter (Signed)
Aware of below message  

## 2024-02-12 ENCOUNTER — Telehealth: Payer: Self-pay | Admitting: *Deleted

## 2024-02-17 ENCOUNTER — Other Ambulatory Visit

## 2024-02-17 DIAGNOSIS — E785 Hyperlipidemia, unspecified: Secondary | ICD-10-CM

## 2024-02-17 DIAGNOSIS — I5032 Chronic diastolic (congestive) heart failure: Secondary | ICD-10-CM

## 2024-02-17 NOTE — Progress Notes (Signed)
 "  02/17/2024 Name: Vanessa Palmer MRN: [id number] DOB: 06-18-1950  Chief Complaint  Patient presents with   Congestive Heart Failure    Vanessa Palmer is a 73 y.o. year old female who was referred for medication management by their primary care provider, [name], DO. They presented for a telephone visit today.   They were referred to the pharmacist by their PCP for assistance in managing hypertension . PMH includes HTN, chronic stable angina, asthma, HFpEF with LVH (EF 60-65% in 2021, with G1DD), OSA, asthma, GERD, hearing loss, CKD3, HLD, depression, bilateral DVT in 2014 (on lifelong anticoag).   Subjective: Patient was seen by PCP, Dr. [name], on 02/06/24. BP was 112/78 mmHg. We had recently discontinued spironolactone  due to worsening kidney function. Patient recently established with nephology.   Today, patient reports doing well. She only has time for an abbreviated phone call. She is not at home with her medications. States she does not have questions or concerns about her medications at this time.   Care Team: Primary Care Provider: Dr. [name] - scheduled ~05/06/24  Medication Access/Adherence  Current Pharmacy:  [id number] - [place], [place] - [address] AT [facility] OF [address] [place] [address] Phone: [phone] Fax: [phone]  [id number] - [place], [place] - [address] AT [facility] [address] [place] [address] Phone: [phone] Fax: [phone]  [facility] [address] [place] [address] Phone: [phone] Fax: [phone]  [facility] - [facility] [address] [place] [address] Phone: [phone] Fax: [phone]   Patient reports affordability concerns with their medications: No  Patient reports access/transportation  concerns to their pharmacy: No  - someone picks up her medications for her from [facility]. Offered to switch her to mail order pharmacy today - she declined today, but said she would consider it.  Patient reports adherence concerns with their medications:  Yes  - taking hydralazine  100 mg BID, though this is not on her med list. Only taking carvedilol  once daily.   Heart Failure/HTN (EF 60-65% in May 2021):  Current medications:  ACEi/ARB/ARNI: C/I due to hx of angioedema on ACEi and ARB SGLT2i: none - urinary incontinence, high risk for GU infections Beta blocker: carvedilol  12.5 mg BID Mineralocorticoid Receptor Antagonist: none - d/c due to worsening kidney function Diuretic regimen: torsemide  20 mg daily  Other BP medications: amlodipine  10 mg daily, hydralazine  100 mg BID (2x 50 mg tablets) in the morning and at bedtime.  Current home blood pressure readings: - has a BP cuff but is not checking at home and unable to report readings today Current home weights: not checking - limited by mobility  Did not discuss HF symptoms today.  Current meal patterns:  Reports she does not add salt to foods Foods mostly prepared at home.  Current medication access support: [employer] Dual Complete  Medication Management:  Current adherence strategy: patient takes medications out of the bottles  Patient reports Fair adherence to medications - full med review completed 12/30/23  Objective:  BP Readings from Last 3 Encounters:  02/06/24 112/78  01/11/24 101/62  12/30/23 131/66    Lab Results  Component Value Date   HGBA1C 5.7 02/06/2024   HGBA1C 5.6 07/27/2022   HGBA1C 5.5 12/16/2014       Latest Ref Rng & Units 02/06/2024  3:36 PM 01/10/2024    8:36 PM 01/10/2024    8:35 PM  BMP  Glucose 70 - 99 mg/dL 891  872  870   BUN 8 - 27 mg/dL 25  37  34   Creatinine 0.57 - 1.00 mg/dL 8.01  7.09  7.23   BUN/Creat Ratio 12 - 28 13     Sodium 134 - 144 mmol/L 140  139  139   Potassium 3.5 -  5.2 mmol/L 5.0  4.3  4.4   Chloride 96 - 106 mmol/L 104  105  103   CO2 20 - 29 mmol/L 22   21   Calcium  8.7 - 10.3 mg/dL 8.7   8.6     Lab Results  Component Value Date   CHOL 128 12/30/2023   HDL 44 12/30/2023   LDLCALC 69 12/30/2023   TRIG 75 12/30/2023   CHOLHDL 2.9 12/30/2023    Medications Reviewed Today     Reviewed by Brinda Lorain SQUIBB, RPH-CPP (Pharmacist) on 02/17/24 at 1718  Med List Status: <None>   Medication Order Taking? Sig Documenting Provider Last Dose Status Informant  amLODipine  (NORVASC ) 10 MG tablet [id number] Yes Take 1 tablet (10 mg total) by mouth daily. Briana Elgin LABOR, MD  Active   atorvastatin  (LIPITOR ) 40 MG tablet [id number] Yes Take 1 tablet (40 mg total) by mouth daily. Addie Perkins, DO  Active Pharmacy Records           Med Note STEFFI, ALEX[place]RIA   Tue Sep 10, 2023 12:36 PM)  Recent Dispenses     08/18/2023 40 MG TABS (disp 90, 90d supply) 06/11/2023 40 MG TABS (disp 90, 90d supply)     budesonide -formoterol  (SYMBICORT ) 160-4.5 MCG/ACT inhaler [id number]  Inhale 2 puffs into the lungs 3 times/day as needed-between meals & bedtime. Leopold Perkins NOVAK, MD  Active Pharmacy Records           Med Note STEFFI, [place]   Tue Sep 10, 2023 12:37 PM) No recent fills  carvedilol  (COREG ) 12.5 MG tablet [id number] Yes Take 1 tablet (12.5 mg total) by mouth 2 (two) times daily with a meal. [name] Sharper, MD  Active   cyclobenzaprine  (FLEXERIL ) 10 MG tablet [id number]  Take 1 tablet (10 mg total) by mouth 2 (two) times daily as needed for muscle spasms. Vernetta Lonni GRADE, MD  Active Pharmacy Records           Med Note STEFFI, ADELITA   Tue Sep 10, 2023 12:37 PM) Recent Dispenses    08/09/2023 10 MG TABS (disp 30, 15d supply) 05/30/2023 10 MG TABS (disp 30, 15d supply)    diclofenac  Sodium (VOLTAREN  ARTHRITIS PAIN) 1 % GEL [id number]  Apply 2 g topically 4 (four) times daily. Elicia Sharper, DO  Active   EPINEPHrine  0.3 mg/0.3 mL IJ SOAJ  injection [id number]  Inject 0.3 mg into the muscle as needed for anaphylaxis. [name] Sharper, MD  Active   feeding supplement (ENSURE PLUS HIGH PROTEIN) LIQD [id number]  Take 237 mLs by mouth 2 (two) times daily between meals. Briana Elgin LABOR, MD  Active   gabapentin  (NEURONTIN ) 300 MG capsule [id number]  TAKE 1 CAPSULE(300 MG) BY MOUTH THREE TIMES DAILY [name], DO  Active   hydrALAZINE  (APRESOLINE ) 50 MG tablet [id number] Yes Take 100 mg by mouth in the morning and at bedtime. [provider]  Active   nitroGLYCERIN  (NITROSTAT ) 0.4 MG SL tablet [id number]  Place 1 tablet (0.4 mg total) under the tongue every 5 (five)  minutes as needed for chest pain. [name], DO  Active Pharmacy Records  ondansetron  (ZOFRAN ) 4 MG tablet [id number]  Take 1 tablet (4 mg total) by mouth every 8 (eight) hours as needed for nausea or vomiting. Almousa, Siham, MD  Active   oxyCODONE -acetaminophen  (PERCOCET) 10-325 MG tablet [id number]  Take 1 tablet by mouth every 12 (twelve) hours as needed. [name] Sharper, MD  Active   pantoprazole  (PROTONIX ) 40 MG tablet [id number]  Take 1 tablet (40 mg total) by mouth daily. [name] Sharper, MD  Active   rivaroxaban  (XARELTO ) 20 MG TABS tablet [id number] Yes TAKE ONE TABLET BY MOUTH DAILY WITH SUPPER Addie Perkins, DO  Active Pharmacy Records           Med Note STEFFI, ADELITA   Tue Sep 10, 2023 12:39 PM) Recent Dispenses    06/11/2023 20 MG TABS (disp 90, 90d supply) 03/11/2023 20 MG TABS (disp 90, 90d supply)    sertraline  (ZOLOFT ) 100 MG tablet [id number]  Take 1 tablet (100 mg total) by mouth daily. Gregary Sharper, MD  Active Pharmacy Records           Med Note STEFFI, ADELITA   Tue Sep 10, 2023 12:39 PM) Recent Dispenses    08/18/2023 100 MG TABS (disp 90, 90d supply) 07/12/2023 100 MG TABS (disp 31, 31d supply)    tolterodine  (DETROL  LA) 2 MG 24 hr capsule [id number]  Take 1 capsule (2 mg total) by mouth daily.  Patient not taking:  Reported on 12/30/2023   Addie Perkins, DO  Active Pharmacy Records           Med Note STEFFI, ADELITA   Tue Sep 10, 2023 12:41 PM)  Recent Dispenses     07/16/2023 2 MG CP24 (disp 30, 30d supply) 06/14/2023 2 MG CP24 (disp 30, 30d supply)     torsemide  (DEMADEX ) 20 MG tablet [id number] Yes Take 1 tablet (20 mg total) by mouth daily. [name], DO  Active Pharmacy Records           Med Note STEFFI, ADELITA   Tue Sep 10, 2023 12:41 PM) Recent Dispenses    08/27/2023 20 MG TABS (disp 90, 90d supply) 06/20/2023 20 MG TABS (disp 180, 90d supply) 09/27/2022 20 MG TABS (disp 180, 90d supply)                Assessment/Plan:    Heart Failure with preserved EF (60-65% in 2021, with G1DD): - Currently appropriately managed. BP has been at goal < 130/80 mmHg at recent OV. Patient denies any questions or concerns with current medications regimen. Could consider consolidating hydralazine  to 100 mg tablets (rather than 2x 50 mg tablets) in the future to simplify regimen. Scr improved on recent BMP but eGFR still < 30 mL/min, so agree with stopping spironolactone .  - Reviewed appropriate blood pressure monitoring technique and reviewed goal blood pressure - Reviewed to weigh daily and when to contact cardiology with weight gain - Reviewed dietary modifications including restricting salt and fluid intake - Recommend to monitor blood pressure at home and keep log to review at next appointment - Recommend to continue current regimen  Current medications:  ACEi/ARB/ARNI: C/I due to hx of angioedema on ACEi and ARB SGLT2i: none - urinary incontinence, high risk for GU infections Beta blocker: carvedilol  12.5 mg BID Mineralocorticoid Receptor Antagonist: none - d/c due to worsening kidney function Diuretic regimen: torsemide  20 mg daily  Other BP medications: amlodipine  10 mg daily, hydralazine  100 mg  BID (2x 50 mg tablets) in the morning and at  bedtime.   Hyperlipidemia/ASCVD Risk Reduction: - Currently controlled with most recent LDL-C of 69 mg/dL below goal < 70 mg/dL given age + comorbidities (ASCVD > stable angina). High intensity statin indicated.  - Reviewed long term complications of uncontrolled cholesterol - Recommend to continue atorvastatin  40 mg daily - Recommend to continue Xarelto  20 mg daily for DVT prevention/stable CAD   Medication Management: - Tolterodine  remains on medication list, but patient is not currently taking. Will remove today.    Patient verbalized understanding of treatment plan.    Follow Up Plan:  Pharmacist in person 03/30/24 PCP clinic visit ~ March 2026   Lorain Baseman, PharmD [facility] [phone]   "

## 2024-02-18 NOTE — Progress Notes (Signed)
 Internal Medicine Clinic Attending  Case discussed with the resident at the time of the visit.  We reviewed the resident's history and exam and pertinent patient test results.  I agree with the assessment, diagnosis, and plan of care documented in the resident's note.

## 2024-02-19 ENCOUNTER — Ambulatory Visit: Payer: Self-pay

## 2024-02-19 NOTE — Telephone Encounter (Signed)
 I talked to the pt who stated she's ok, she's not going anywhere today. STated she will  go to the ER on Friday; she will go sooner if anything changes.

## 2024-02-19 NOTE — Telephone Encounter (Signed)
 Patient was last seen in primary care on 02/06/2024 by [name] Sharper, MD.  Called Nurse Triage reporting Knee Pain and Leg Swelling.  Symptoms began several days ago.  Interventions attempted: Other: compression stockings .  Symptoms are: stable.  Triage Disposition: See HCP Within 4 Hours (Or PCP Triage)  Patient/caregiver understands and will follow disposition?: No, refuses disposition    Spoke with [facility] - [facility] Saw pt today not with patient now. Any report of pain has to report. Right knee 8/10 pain level. Did advise hadnt taken medication would take it later also right lower bandage mentions something seeping out of leg. States has had this issue before . Myles Glatter home health RN will call patient.  Called and spoke with patient Patient reports fluid /water leaking out of leg has ben putting on stockings and  saw leaking clear fluid , put on bandage , hasn't check since.  Hadn't taken pain medication (oxycodone ) when home health was there , eating now going to take after. Knee pain right now 8/10  but depends on the weather. No available appointments in office today advised urgent care, patient declined and states I will go Friday patient did agree to ER if worsening symptoms shortness of breath, dizziness, chest pain.    Reason for Disposition  SEVERE leg swelling (e.g., swelling extends above knee, entire leg is swollen, weeping fluid)  Answer Assessment - Initial Assessment Questions 1. ONSET: When did the swelling start? (e.g., minutes, hours, days)     Noticed the leaking of clear fluid from right leg below the knee Friday both legs are swollen into feet  2. LOCATION: What part of the leg is swollen?  Are both legs swollen or just one leg?     Below the right knee 3. SEVERITY: How bad is the swelling? (e.g., localized; mild, moderate, severe)     Leaking clear fluid from 2-3 spots from that area unsure if swelling related has a  bandage on it nolw 4. REDNESS: Is there redness or signs of infection?     Denies  5. PAIN: Is the swelling painful to touch? If Yes, ask: How painful is it?   (Scale 1-10; mild, moderate or severe)     Denies pain in the right leg,. But  there is is pain right knee , has ongoing chronic knee pain that depends on the weather  6. FEVER: Do you have a fever? If Yes, ask: What is it, how was it measured, and when did it start?      Denies  7. CAUSE: What do you think is causing the leg swelling?     Unknown , does have history hChronic heart failure with preserved ejection fraction (HCC) per chart  8. MEDICAL HISTORY: Do you have a history of blood clots (e.g., DVT), cancer, heart failure, kidney disease, or liver failure?     Chronic heart failure with preserved ejection fraction (HCC) takes torsemide   9. RECURRENT SYMPTOM: Have you had leg swelling before? If Yes, ask: When was the last time? What happened that time?     Has had this similar swelling with fluid leaking  10. OTHER SYMPTOMS: Do you have any other symptoms? (e.g., chest pain, difficulty breathing) Has ongoing right knee pain        Denies the following the chest pain, difficulty breathing, dizziness  Protocols used: Leg Swelling and Edema-A-AH Copied from CRM #[id number]. Topic: Clinical - Red Word Triage >> Feb 19, 2024 10:27 AM Vivian Z  wrote: Red Word that prompted transfer to Nurse Triage: [facility] stating that patient is having right knee pain.

## 2024-02-22 ENCOUNTER — Ambulatory Visit (HOSPITAL_COMMUNITY)

## 2024-02-25 ENCOUNTER — Ambulatory Visit: Payer: Self-pay

## 2024-02-25 NOTE — Telephone Encounter (Signed)
" °  FYI Only or Action Required?: FYI only for provider: appointment scheduled on 12/31, declined UC/ER.  Patient was last seen in primary care on 02/06/2024 by [name] Sharper, MD.  Called Nurse Triage reporting Leg Swelling.  Symptoms began a week ago.  Interventions attempted: Prescription medications: Lasix .  Symptoms are: gradually worsening.  Triage Disposition: See HCP Within 4 Hours (Or PCP Triage)  Patient/caregiver understands and will follow disposition?:    Copied from CRM #[id number]. Topic: Clinical - Red Word Triage >> Feb 25, 2024  3:46 PM Mercer PEDLAR wrote: Red Word that prompted transfer to Nurse Triage:  [facility], stated that patient has significant fluid gain since 02/19/24 right ankle size is 34 cm and today is 40cm. Reason for Disposition  SEVERE leg swelling (e.g., swelling extends above knee, entire leg is swollen, weeping fluid)  Answer Assessment - Initial Assessment Questions 1. ONSET: When did the swelling start? (e.g., minutes, hours, days)     A week ago 2. LOCATION: What part of the leg is swollen?  Are both legs swollen or just one leg?     Mostly semetrical 3. SEVERITY: How bad is the swelling? (e.g., localized; mild, moderate, severe)     Severe up to hip 4. REDNESS: Is there redness or signs of infection?     no 5. PAIN: Is the swelling painful to touch? If Yes, ask: How painful is it?   (Scale 1-10; mild, moderate or severe)     moderate 6. FEVER: Do you have a fever? If Yes, ask: What is it, how was it measured, and when did it start?      no 10. OTHER SYMPTOMS: Do you have any other symptoms? (e.g., chest pain, difficulty breathing)       denies  Protocols used: Leg Swelling and Edema-A-AH  "

## 2024-02-26 ENCOUNTER — Emergency Department (HOSPITAL_COMMUNITY)

## 2024-02-26 ENCOUNTER — Inpatient Hospital Stay (HOSPITAL_COMMUNITY)
Admission: EM | Admit: 2024-02-26 | Discharge: 2024-02-28 | DRG: 291 | Disposition: A | Discharge status: Home Health | Attending: Internal Medicine | Admitting: Internal Medicine

## 2024-02-26 ENCOUNTER — Other Ambulatory Visit: Payer: Self-pay

## 2024-02-26 ENCOUNTER — Encounter: Payer: Self-pay | Admitting: Student

## 2024-02-26 ENCOUNTER — Encounter (HOSPITAL_COMMUNITY): Payer: Self-pay

## 2024-02-26 ENCOUNTER — Ambulatory Visit: Admitting: Student

## 2024-02-26 VITALS — BP 170/98 | HR 73 | Temp 97.7°F | Ht 62.0 in

## 2024-02-26 DIAGNOSIS — E66813 Obesity, class 3: Secondary | ICD-10-CM | POA: Diagnosis present

## 2024-02-26 DIAGNOSIS — M7989 Other specified soft tissue disorders: Secondary | ICD-10-CM

## 2024-02-26 DIAGNOSIS — F331 Major depressive disorder, recurrent, moderate: Secondary | ICD-10-CM | POA: Diagnosis present

## 2024-02-26 DIAGNOSIS — D631 Anemia in chronic kidney disease: Secondary | ICD-10-CM | POA: Diagnosis present

## 2024-02-26 DIAGNOSIS — G8929 Other chronic pain: Secondary | ICD-10-CM | POA: Diagnosis present

## 2024-02-26 DIAGNOSIS — N184 Chronic kidney disease, stage 4 (severe): Secondary | ICD-10-CM | POA: Diagnosis present

## 2024-02-26 DIAGNOSIS — I10 Essential (primary) hypertension: Secondary | ICD-10-CM | POA: Diagnosis present

## 2024-02-26 DIAGNOSIS — N183 Chronic kidney disease, stage 3 unspecified: Secondary | ICD-10-CM | POA: Diagnosis present

## 2024-02-26 DIAGNOSIS — K219 Gastro-esophageal reflux disease without esophagitis: Secondary | ICD-10-CM | POA: Diagnosis present

## 2024-02-26 DIAGNOSIS — I2510 Atherosclerotic heart disease of native coronary artery without angina pectoris: Secondary | ICD-10-CM | POA: Diagnosis present

## 2024-02-26 DIAGNOSIS — R32 Unspecified urinary incontinence: Secondary | ICD-10-CM | POA: Diagnosis present

## 2024-02-26 DIAGNOSIS — Z886 Allergy status to analgesic agent status: Secondary | ICD-10-CM

## 2024-02-26 DIAGNOSIS — I130 Hypertensive heart and chronic kidney disease with heart failure and stage 1 through stage 4 chronic kidney disease, or unspecified chronic kidney disease: Principal | ICD-10-CM | POA: Diagnosis present

## 2024-02-26 DIAGNOSIS — Z79891 Long term (current) use of opiate analgesic: Secondary | ICD-10-CM

## 2024-02-26 DIAGNOSIS — I5032 Chronic diastolic (congestive) heart failure: Secondary | ICD-10-CM

## 2024-02-26 DIAGNOSIS — Z818 Family history of other mental and behavioral disorders: Secondary | ICD-10-CM | POA: Diagnosis not present

## 2024-02-26 DIAGNOSIS — I890 Lymphedema, not elsewhere classified: Secondary | ICD-10-CM

## 2024-02-26 DIAGNOSIS — M170 Bilateral primary osteoarthritis of knee: Secondary | ICD-10-CM | POA: Diagnosis present

## 2024-02-26 DIAGNOSIS — Z833 Family history of diabetes mellitus: Secondary | ICD-10-CM

## 2024-02-26 DIAGNOSIS — I5033 Acute on chronic diastolic (congestive) heart failure: Secondary | ICD-10-CM | POA: Diagnosis present

## 2024-02-26 DIAGNOSIS — R600 Localized edema: Secondary | ICD-10-CM | POA: Diagnosis present

## 2024-02-26 DIAGNOSIS — R011 Cardiac murmur, unspecified: Secondary | ICD-10-CM | POA: Diagnosis present

## 2024-02-26 DIAGNOSIS — Z602 Problems related to living alone: Secondary | ICD-10-CM | POA: Diagnosis not present

## 2024-02-26 DIAGNOSIS — Z7901 Long term (current) use of anticoagulants: Secondary | ICD-10-CM

## 2024-02-26 DIAGNOSIS — D649 Anemia, unspecified: Secondary | ICD-10-CM | POA: Diagnosis present

## 2024-02-26 DIAGNOSIS — Z91013 Allergy to seafood: Secondary | ICD-10-CM

## 2024-02-26 DIAGNOSIS — Z6841 Body Mass Index (BMI) 40.0 and over, adult: Secondary | ICD-10-CM

## 2024-02-26 DIAGNOSIS — Z888 Allergy status to other drugs, medicaments and biological substances status: Secondary | ICD-10-CM

## 2024-02-26 DIAGNOSIS — N1832 Chronic kidney disease, stage 3b: Secondary | ICD-10-CM

## 2024-02-26 DIAGNOSIS — M179 Osteoarthritis of knee, unspecified: Secondary | ICD-10-CM | POA: Diagnosis present

## 2024-02-26 DIAGNOSIS — J45909 Unspecified asthma, uncomplicated: Secondary | ICD-10-CM | POA: Diagnosis present

## 2024-02-26 DIAGNOSIS — Z885 Allergy status to narcotic agent status: Secondary | ICD-10-CM

## 2024-02-26 DIAGNOSIS — Z91010 Allergy to peanuts: Secondary | ICD-10-CM

## 2024-02-26 DIAGNOSIS — Z8249 Family history of ischemic heart disease and other diseases of the circulatory system: Secondary | ICD-10-CM

## 2024-02-26 DIAGNOSIS — M109 Gout, unspecified: Secondary | ICD-10-CM | POA: Diagnosis present

## 2024-02-26 DIAGNOSIS — Z66 Do not resuscitate: Secondary | ICD-10-CM | POA: Diagnosis present

## 2024-02-26 DIAGNOSIS — Z96641 Presence of right artificial hip joint: Secondary | ICD-10-CM | POA: Diagnosis present

## 2024-02-26 DIAGNOSIS — E785 Hyperlipidemia, unspecified: Secondary | ICD-10-CM | POA: Diagnosis present

## 2024-02-26 DIAGNOSIS — Z86718 Personal history of other venous thrombosis and embolism: Secondary | ICD-10-CM

## 2024-02-26 DIAGNOSIS — I509 Heart failure, unspecified: Principal | ICD-10-CM

## 2024-02-26 DIAGNOSIS — Z79899 Other long term (current) drug therapy: Secondary | ICD-10-CM

## 2024-02-26 DIAGNOSIS — Z7951 Long term (current) use of inhaled steroids: Secondary | ICD-10-CM

## 2024-02-26 LAB — BASIC METABOLIC PANEL WITH GFR
Anion gap: 11 (ref 5–15)
BUN: 26 mg/dL — ABNORMAL HIGH (ref 8–23)
CO2: 22 mmol/L (ref 22–32)
Calcium: 9 mg/dL (ref 8.9–10.3)
Chloride: 104 mmol/L (ref 98–111)
Creatinine, Ser: 1.66 mg/dL — ABNORMAL HIGH (ref 0.44–1.00)
GFR, Estimated: 33 mL/min — ABNORMAL LOW
Glucose, Bld: 94 mg/dL (ref 70–99)
Potassium: 4.8 mmol/L (ref 3.5–5.1)
Sodium: 138 mmol/L (ref 135–145)

## 2024-02-26 LAB — URINALYSIS, ROUTINE W REFLEX MICROSCOPIC
Bacteria, UA: NONE SEEN
Bilirubin Urine: NEGATIVE
Glucose, UA: NEGATIVE mg/dL
Ketones, ur: NEGATIVE mg/dL
Leukocytes,Ua: NEGATIVE
Nitrite: NEGATIVE
Protein, ur: NEGATIVE mg/dL
Specific Gravity, Urine: 1.006 (ref 1.005–1.030)
pH: 7 (ref 5.0–8.0)

## 2024-02-26 LAB — PROTIME-INR
INR: 1.3 — ABNORMAL HIGH (ref 0.8–1.2)
Prothrombin Time: 17.3 s — ABNORMAL HIGH (ref 11.4–15.2)

## 2024-02-26 LAB — CBC
HCT: 31.3 % — ABNORMAL LOW (ref 36.0–46.0)
Hemoglobin: 9.9 g/dL — ABNORMAL LOW (ref 12.0–15.0)
MCH: 30.2 pg (ref 26.0–34.0)
MCHC: 31.6 g/dL (ref 30.0–36.0)
MCV: 95.4 fL (ref 80.0–100.0)
Platelets: 176 K/uL (ref 150–400)
RBC: 3.28 MIL/uL — ABNORMAL LOW (ref 3.87–5.11)
RDW: 14.6 % (ref 11.5–15.5)
WBC: 4.5 K/uL (ref 4.0–10.5)
nRBC: 0 % (ref 0.0–0.2)

## 2024-02-26 LAB — TROPONIN T, HIGH SENSITIVITY
Troponin T High Sensitivity: <15 ng/L (ref 0–19)
Troponin T High Sensitivity: <15 ng/L (ref 0–19)

## 2024-02-26 LAB — PRO BRAIN NATRIURETIC PEPTIDE: Pro Brain Natriuretic Peptide: 1620 pg/mL — ABNORMAL HIGH

## 2024-02-26 LAB — MAGNESIUM: Magnesium: 1.5 mg/dL — ABNORMAL LOW (ref 1.7–2.4)

## 2024-02-26 LAB — TSH: TSH: 1.11 u[IU]/mL (ref 0.350–4.500)

## 2024-02-26 LAB — CK: Total CK: 99 U/L (ref 38–234)

## 2024-02-26 MED ORDER — ATORVASTATIN CALCIUM 40 MG PO TABS
40.0000 mg | ORAL_TABLET | Freq: Every day | ORAL | Status: DC
  Administered 2024-02-27 – 2024-02-28 (×2): 40 mg via ORAL
  Filled 2024-02-26 (×2): qty 1

## 2024-02-26 MED ORDER — TRAMADOL HCL 50 MG PO TABS
50.0000 mg | ORAL_TABLET | Freq: Once | ORAL | Status: AC
  Administered 2024-02-26: 50 mg via ORAL
  Filled 2024-02-26: qty 1

## 2024-02-26 MED ORDER — ACETAMINOPHEN 325 MG PO TABS
325.0000 mg | ORAL_TABLET | Freq: Four times a day (QID) | ORAL | Status: DC | PRN
  Administered 2024-02-27: 325 mg via ORAL
  Filled 2024-02-26: qty 1

## 2024-02-26 MED ORDER — SERTRALINE HCL 100 MG PO TABS
100.0000 mg | ORAL_TABLET | Freq: Every day | ORAL | Status: DC
  Administered 2024-02-27 – 2024-02-28 (×2): 100 mg via ORAL
  Filled 2024-02-26 (×2): qty 1

## 2024-02-26 MED ORDER — ONDANSETRON HCL 4 MG PO TABS
4.0000 mg | ORAL_TABLET | Freq: Three times a day (TID) | ORAL | Status: DC | PRN
  Administered 2024-02-27: 4 mg via ORAL
  Filled 2024-02-26: qty 1

## 2024-02-26 MED ORDER — PANTOPRAZOLE SODIUM 40 MG PO TBEC
40.0000 mg | DELAYED_RELEASE_TABLET | Freq: Every day | ORAL | Status: DC
  Administered 2024-02-26 – 2024-02-28 (×3): 40 mg via ORAL
  Filled 2024-02-26 (×3): qty 1

## 2024-02-26 MED ORDER — OXYCODONE HCL 5 MG PO TABS
5.0000 mg | ORAL_TABLET | Freq: Two times a day (BID) | ORAL | Status: DC | PRN
  Administered 2024-02-28: 5 mg via ORAL
  Filled 2024-02-26: qty 1

## 2024-02-26 MED ORDER — HYDRALAZINE HCL 50 MG PO TABS
50.0000 mg | ORAL_TABLET | Freq: Three times a day (TID) | ORAL | Status: DC
  Administered 2024-02-26 – 2024-02-27 (×4): 50 mg via ORAL
  Filled 2024-02-26 (×7): qty 1

## 2024-02-26 MED ORDER — RIVAROXABAN 20 MG PO TABS
20.0000 mg | ORAL_TABLET | Freq: Every day | ORAL | Status: DC
  Administered 2024-02-26 – 2024-02-28 (×3): 20 mg via ORAL
  Filled 2024-02-26: qty 1
  Filled 2024-02-26: qty 2
  Filled 2024-02-26: qty 1

## 2024-02-26 MED ORDER — AMLODIPINE BESYLATE 10 MG PO TABS
10.0000 mg | ORAL_TABLET | Freq: Every day | ORAL | Status: DC
  Administered 2024-02-27 – 2024-02-28 (×2): 10 mg via ORAL
  Filled 2024-02-26: qty 1
  Filled 2024-02-26: qty 2

## 2024-02-26 MED ORDER — FUROSEMIDE 10 MG/ML IJ SOLN
20.0000 mg | Freq: Once | INTRAMUSCULAR | Status: AC
  Administered 2024-02-26: 20 mg via INTRAVENOUS
  Filled 2024-02-26: qty 2

## 2024-02-26 MED ORDER — HEPARIN SODIUM (PORCINE) 5000 UNIT/ML IJ SOLN: 5000.0000 [IU] | Freq: Three times a day (TID) | INTRAMUSCULAR | Status: DC

## 2024-02-26 MED ORDER — ENSURE PLUS HIGH PROTEIN PO LIQD
237.0000 mL | Freq: Two times a day (BID) | ORAL | Status: DC
  Administered 2024-02-27 – 2024-02-28 (×3): 237 mL via ORAL
  Filled 2024-02-26 (×3): qty 237

## 2024-02-26 MED ORDER — CYCLOBENZAPRINE HCL 10 MG PO TABS
10.0000 mg | ORAL_TABLET | Freq: Two times a day (BID) | ORAL | Status: DC | PRN
  Administered 2024-02-26 – 2024-02-27 (×2): 10 mg via ORAL
  Filled 2024-02-26 (×2): qty 1

## 2024-02-26 MED ORDER — GABAPENTIN 300 MG PO CAPS
300.0000 mg | ORAL_CAPSULE | Freq: Three times a day (TID) | ORAL | Status: DC
  Administered 2024-02-26 – 2024-02-28 (×7): 300 mg via ORAL
  Filled 2024-02-26 (×7): qty 1

## 2024-02-26 MED ORDER — DICLOFENAC SODIUM 1 % EX GEL
2.0000 g | Freq: Four times a day (QID) | CUTANEOUS | Status: DC
  Filled 2024-02-26 (×2): qty 100

## 2024-02-26 MED ORDER — FENTANYL CITRATE (PF) 50 MCG/ML IJ SOSY
12.5000 ug | PREFILLED_SYRINGE | INTRAMUSCULAR | Status: DC | PRN
  Administered 2024-02-26: 12.5 ug via INTRAVENOUS
  Filled 2024-02-26: qty 1

## 2024-02-26 MED ORDER — FLUTICASONE FUROATE-VILANTEROL 200-25 MCG/ACT IN AEPB
1.0000 | INHALATION_SPRAY | Freq: Every day | RESPIRATORY_TRACT | Status: DC
  Administered 2024-02-27 – 2024-02-28 (×2): 1 via RESPIRATORY_TRACT
  Filled 2024-02-26: qty 28

## 2024-02-26 MED ORDER — FENTANYL CITRATE (PF) 50 MCG/ML IJ SOSY
25.0000 ug | PREFILLED_SYRINGE | Freq: Once | INTRAMUSCULAR | Status: AC
  Administered 2024-02-26: 25 ug via INTRAVENOUS
  Filled 2024-02-26: qty 1

## 2024-02-26 MED ORDER — OXYCODONE-ACETAMINOPHEN 5-325 MG PO TABS
1.0000 | ORAL_TABLET | Freq: Two times a day (BID) | ORAL | Status: DC | PRN
  Administered 2024-02-27 – 2024-02-28 (×3): 1 via ORAL
  Filled 2024-02-26 (×3): qty 1

## 2024-02-26 MED ORDER — FUROSEMIDE 10 MG/ML IJ SOLN
40.0000 mg | Freq: Two times a day (BID) | INTRAMUSCULAR | Status: DC
  Filled 2024-02-26: qty 4

## 2024-02-26 MED ORDER — FUROSEMIDE 10 MG/ML IJ SOLN
40.0000 mg | Freq: Two times a day (BID) | INTRAMUSCULAR | Status: DC
  Administered 2024-02-26: 40 mg via INTRAVENOUS
  Filled 2024-02-26: qty 4

## 2024-02-26 MED ORDER — OXYCODONE-ACETAMINOPHEN 10-325 MG PO TABS: 1.0000 | ORAL_TABLET | Freq: Two times a day (BID) | ORAL | Status: DC

## 2024-02-26 NOTE — Assessment & Plan Note (Signed)
 Hx of HTN, HFpEF (EF 60-65%), CKD 3B, chronic back pain w/ chronic opioid use. Acute visit for b/l leg swelling that has progressive worsen over the past 2-3 weeks. Tender and tight up to her thighs bilaterally. Difficulty ambulating compared to baseline. Some SOB and occasional chest pain w/ hx of chronic stable angina. Hx of HFpEF but echo from years ago. She lives alone at apartment. Granddaughter comes occasionally and has aide that helps w/ chores. Not been able to sleep in bed due to its height so sleeping in her chair for past weeks.   Given significant swelling bilaterally up to thighs and living alone, recommend ED evaluation, repeat echo and IV diuresis.   Patient agrees w/ ED evaluation and transport by EMS. Discussed w/ patient and pt's granddaughter.

## 2024-02-26 NOTE — Progress Notes (Signed)
 Internal Medicine Clinic Attending  I was physically present during the key portions of the resident provided service and participated in the medical decision making of patient's management care. I reviewed pertinent patient test results.  The assessment, diagnosis, and plan were formulated together and I agree with the documentation in the resident's note.  Vanessa Clarity, MD    Patient will likely need to be admitted to the IMTS service.  She has HFpEF, lives alone, and is now unable to ambulate due to significant, progressive lower extremity swelling.  VSS. I recommend diuresis and likely unna boots in the hospital.

## 2024-02-26 NOTE — H&P (Signed)
 " Date: 02/26/2024               Patient Name:  Vanessa Palmer MRN: [id number]  DOB: 11-11-1950 Age / Sex: 73 y.o., female   PCP: [name], DO         Medical Service: Internal Medicine Teaching Service         Attending Physician: Dr. Eben Reyes BROCKS, MD      First Contact: Intern on Call: [phone] Viktoria King      Second Contact: Resident on Call:[phone] Damien Lease                    SUBJECTIVE   Chief Complaint: Leg swelling   History of Present Illness: Vanessa Palmer is a 73 year old female with a persistent past medical history of HFpEF, LVEF60-65% 2021, Severe chronic knee and back pain on chronic opioid therapy, CKD stage IV, DVT in 2014 who presented to the emergency department for leg swelling, sent here by her PCP office.  2 days ago patient's at home physical therapist recommended she go to the hospital due to increasing in leg swelling.  Patient called her PCP and had an appointment made for this morning at 9 AM.  At the appointment it was recommended she be admitted to the hospital for IV diuresis and Unna boot therapy in the setting of fluid overload from congestive heart failure.  History per patient limited due to pain.  The pain in her legs has gotten progressively worse over the last few days.  She is not able to get comfortable in any position.  The pain is worse in the right leg but she has it in both legs.  She has noticed fluid seeping out of her legs.  She can move her bilateral toes and was up walking around earlier today with her walker.  She has had decreased ambulation over the last couple of days.  She has not had any stuffiness or congestion recently. She does remember having mild blood in both of her nostrils last week. Has not changed her diet, she says there is no salt in her house, and has been compliant with her medications.  She has chronic orthopnea but has had increased difficulty with lying flat so much so that she has been sleeping  sitting up in her recliner chair.  She has shortness of breath at baseline but feels as if it has gotten worse over the last few days.  She also has bilateral leg swelling at baseline but this has gotten worse and coincided with her worsening shortness of breath.  She denies chest pain.  She denies headache.  She denies blood in the stool.  She denies blood in her mucus.    Review of Systems: A complete ROS was negative except as per HPI.   ED Course: Per EDP: Massive leg edema, SOB with orthopnea, BNP elevated, on RA      Labs Reviewed  BASIC METABOLIC PANEL WITH GFR - Abnormal; Notable for the following components:      Result Value     BUN 26 (*)      Creatinine, Ser 1.66 (*)      GFR, Estimated 33 (*)      All other components within normal limits  CBC - Abnormal; Notable for the following components:    RBC 3.28 (*)      Hemoglobin 9.9 (*)      HCT 31.3 (*)  All other components within normal limits  PRO BRAIN NATRIURETIC PEPTIDE - Abnormal; Notable for the following components:    Pro Brain Natriuretic Peptide 1,620.0 (*)   Normal sinus rhythm Normal ECG   Given: 20 mg IV furosemide   Imaging: Chest x-ray with cardiomegaly and low lung volumes  Past Medical History: Lymphedema  HFpEF, LVEF 60-65% 2021 Severe chronic knee and back pain on chronic opioid therapy CKD stage IV HLD H/o DVT in 2014 HTN Urinary incontinence  GERD MDD Gout  Meds:  Active Medications[1] Amlodipine  10 mg daily  Symbicort  inhaler Carvedilol  12.5 mg daily Flexeril  10 mg BID PRN Diclofenac  gel PRN Gabapentin  300 mg TID Hydralazine  50 mg BID ? Nitroglycerin  PRN Ondansetron  4 mg PRN Oxycodone -acetaminophen  10-325 Q12 HR PRN Pantoprazole  40 mg daily Rivaroxaban  20 mg with dinner Sertraline  100 mg daily   Allergies: Allergies as of 02/26/2024 - Review Complete 02/26/2024  Allergen Reaction Noted   Ace inhibitors Swelling and Other (See Comments) 11/03/2014   Angiotensin receptor  blockers Swelling 02/06/2024   Other Anaphylaxis, Itching, and Swelling 02/05/2022   Regadenoson  Hives 06/11/2014   Shellfish allergy Anaphylaxis 09/09/2023   Peanut oil Itching, Rash, and Other (See Comments) 06/11/2014   Hydrocodone -acetaminophen  Itching 11/03/2014   Hydromorphone  hcl Rash and Other (See Comments)    Oxycodone -acetaminophen  Itching 11/03/2014   Propoxyphene n-acetaminophen  Itching     Past Surgical History:  Procedure Laterality Date   ABDOMINAL HYSTERECTOMY     partial; due to endometriosis   CARDIAC CATHETERIZATION  06/2001, 02/2002, 10/2004    (10-15% proximal stenosis of circumflex, diffuse disease of  OM1 and RCA)   CARDIAC CATHETERIZATION  02/21/2017   COLONOSCOPY     JOINT REPLACEMENT     KNEE ARTHROSCOPY Left    LEFT HEART CATH AND CORONARY ANGIOGRAPHY N/A 02/21/2017   Procedure: LEFT HEART CATH AND CORONARY ANGIOGRAPHY;  Surgeon: Levern Hutching, MD;  Location: [facility];  Service: Cardiovascular;  Laterality: N/A;   TOTAL HIP ARTHROPLASTY Right 11/23/2014   Procedure: RIGHT TOTAL HIP ARTHROPLASTY ANTERIOR APPROACH;  Surgeon: Lonni CINDERELLA Poli, MD;  Location: [facility] OR;  Service: Orthopedics;  Laterality: Right;   TUBAL LIGATION      Social:  Lives With: Home alone, aide comes in to help her  Occupation:Not working PCP: [name], DO  Substances: -Alcohol:Sober for one year now   Family History:  Family History  Problem Relation Age of Onset   Mental illness Mother    Heart disease Father    Diabetes Sister    Heart disease Sister    Diabetes Sister    Mental illness Sister    Heart disease Brother    Hypertension Daughter    Heart disease Daughter      OBJECTIVE:   Physical Exam: Blood pressure (!) 184/98, pulse 67, temperature 97.6 F (36.4 C), temperature source Oral, resp. rate 18, SpO2 100%.  Constitutional: Uncomfortable appearing obese female sitting up in hospital ED bed, moaning in pain HENT: normocephalic  atraumatic, mucous membranes moist Cardiovascular: Left upper sternal border systolic murmur, positive JVD the halfway up the mandible, bilateral feet warm to touch with positive DPs Pulmonary/Chest: Increased work of breathing while saturating at 100% on room air, mild bibasilar crackles heard on auscultation, negative for wheeze Abdominal: soft, non-tender, non-distended.  Neurological: alert & oriented x 3 MSK: no gross abnormalities.  Bilateral severe pitting edema Skin: Bilateral lower extremities appear woody, indurated and studded with hyperkeratotic plaques, tender to light palpation Psych: Normal mood  and affect    Labs:    Latest Ref Rng & Units 02/26/2024   12:54 PM 01/10/2024    8:36 PM 01/10/2024    8:35 PM  CBC  WBC 4.0 - 10.5 K/uL 4.5   4.9   Hemoglobin 12.0 - 15.0 g/dL 9.9  9.5  9.1   Hematocrit 36.0 - 46.0 % 31.3  28.0  28.3   Platelets 150 - 400 K/uL 176   177         Latest Ref Rng & Units 02/26/2024   12:54 PM 02/06/2024    3:36 PM 01/10/2024    8:36 PM  CMP  Glucose 70 - 99 mg/dL 94  891  872   BUN 8 - 23 mg/dL 26  25  37   Creatinine 0.44 - 1.00 mg/dL 8.33  8.01  7.09   Sodium 135 - 145 mmol/L 138  140  139   Potassium 3.5 - 5.1 mmol/L 4.8  5.0  4.3   Chloride 98 - 111 mmol/L 104  104  105   CO2 22 - 32 mmol/L 22  22    Calcium  8.9 - 10.3 mg/dL 9.0  8.7        Imaging: DG Chest 2 View Result Date: 02/26/2024 EXAM: 2 VIEW(S) XRAY OF THE CHEST 02/26/2024 11:50:00 AM COMPARISON: 01/10/2024 CLINICAL HISTORY: SOB FINDINGS: LUNGS AND PLEURA: Low lung volumes. No focal pulmonary opacity. No pleural effusion. No pneumothorax. HEART AND MEDIASTINUM: Cardiomegaly. BONES AND SOFT TISSUES: Thoracic degenerative changes. IMPRESSION: 1. Cardiomegaly. 2. Low lung volumes. Electronically signed by: Waddell Calk MD 02/26/2024 12:53 PM EST RP Workstation: [id number]      EKG: personally reviewed my interpretation is NSR.  ASSESSMENT & PLAN:   Assessment & Plan  by Problem: Principal Problem:   Acute on chronic diastolic heart failure (HCC) Active Problems:   Hyperlipidemia   Major depressive disorder, recurrent episode, moderate (HCC)   Essential hypertension   Asthma, chronic   Chronic kidney disease (CKD), stage III (moderate) (HCC)   Urinary incontinence   GERD (gastroesophageal reflux disease)   Normocytic anemia   Lymphedema   OA (osteoarthritis) of bilateral knees   History of DVT (deep vein thrombosis), 2014   Vanessa Palmer is a 73 y.o. person living with a history of HFpEF, LVEF 60-65% 2021, Severe chronic knee and back pain on chronic opioid therapy, CKD stage IV, DVT in 2014  who presented with recommendation from PCP for bilateral lower extremity swelling and admitted for heart failure exacerbation on hospital day 0  HFpEF Exacerbation, LVEF 60-65% 2021 LUSB systolic Murmur  B/L LE edema Lymphedema  proBNP 1600 on admission, troponins flat, EKG consistent with normal sinus.  UA without signs of infection.  Increased swelling appears to be causing her significant pain although has chronic pain at baseline.  Does have a history of rhabdomyolysis.  Given 20 mg IV Lasix  in the ED with reported good output.  History of heart failure with preserved ejection fraction, last echo in 06/2019 LVEF 60 to 65%, without evidence of aortic stenosis with moderate left ventricular hypertrophy and grade 1 diastolic dysfunction.  Patient's noted increased bilateral lower extremity swelling as well as increasing shortness of breath over the last several days.  Collateral from caretaker reports patient has seen doctors frequently recently and has had several medication changes.  Home medications include carvedilol  12.5 mg twice daily, hydralazine  50 mg twice daily, torsemide  20 mg daily and nitroglycerin  as needed.  Denies clear symptoms of  URI, medication noncompliance or changes in diet.  Is volume overloaded on exam and has audible left upper sternal border  systolic murmur.  Exacerbation could be due to recent medication changes, new or worsening aortic stenosis, thyroid dysfunction, hepatic dysfunction, anemia or diet changes given the recent holiday season.  Plan to workup her exacerbation and repeat echo.  Will give an additional IV 40 mg Lasix  this evening.  With creatinine being around 2, can consider 80 mg IV Lasix  tomorrow. Plan: -Strict ins and outs -Lasix  IV 40 this evening -K>4, Mag>2 -echo ordered -Follow-up TSH and hepatic function panel and iron studies and CK -Daily standing weights   CKD Stage 3B Normocytic anemia GFR stable at 33 today, GFR ranges from lower 30s- mid 20s. Cr baseline appears to be around 1.7-1.9.  Electrolytes stable. -Daily BMP  H/o DVT 2014 -Continue home Xarelto  of 20 mg daily   HTN On a home regimen of hydralazine  50 mg BID, torsemide  20 mg daily, Coreg  12.5 mg BID, and amlodipine  10 mg.  Blood pressure elevated on admission, 184/98, likely contributed to by pain.  Unclear if patient took amlodipine  this morning, will hold until tomorrow.  Will hold carvedilol  in the setting of heart failure exacerbation.  Will give evening dose of hydralazine  50 mg.  Improvement to 153/73 - Hold home carvedilol  12.5 twice daily and amlodipine  10 mg daily - Hydralazine  50 mg 3 times daily  OA of BL knees on chronic opioid therapy  Home regimen includes gabapentin  300 mg 3 times daily and oxycodone -acetaminophen  10-325 mg every 12 hours as needed -Continue home Percocet 10-325 mg Q12 hours  -Continue gabapentin  300 mg 3 times daily -PT/OT ordered   Urinary Incontinence -Purewick while diuresing   GERD -Continue Protonix  40 mg  Asthma  Negative for wheezing on exam. -Continue Symbicort  equivalent: breo ellipta one puff daily    MDD -Continue Zoloft  100 mg daily   HLD -Last LDL was 69 in November 2025, continue Lipitor  40 mg daily  Diet: Heart Healthy VTE: NOAC, Xarelto  20 mg  Code: DNR/DNI  Prior to  Admission Living Arrangement: Home, living alonw Anticipated Discharge Location: Home Barriers to Discharge: Treatment of HFpEF Exacerbation   Dispo: Admit patient to Observation with expected length of stay less than 2 midnights.  Signed:   Viktoria King DO Internal Medicine Resident PGY-1 02/26/2024, 6:12 PM   Please contact the on call pager at [phone]      [1]  No outpatient medications have been marked as taking for the 02/26/24 encounter [facility] Encounter).   "

## 2024-02-26 NOTE — ED Notes (Signed)
 CCMD notified patient placed on cardiac monitor

## 2024-02-26 NOTE — ED Provider Notes (Cosign Needed Addendum)
 " [place] [facility] Provider Note   CSN: [id number] Arrival date & time: 02/26/24  1107     Patient presents with: Leg Swelling   Vanessa Palmer is a 73 y.o. female.   Patient seen in the internal medicine clinic today and sent to the emergency department for evaluation due to swelling in her legs.  Patient reports she has been experiencing more swelling and more shortness of breath.  She went to see her primary care physician today and was advised to come to the emergency department.  Patient reports she has been taking the fluid pill as directed.  She reports frequent urination.  Patient denies any shortness of breath currently.  She reports she is short of breath when she lays down she has been sleeping in a chair.  Patient reports difficulty ambulating due to the swelling.  The history is provided by the patient. No language interpreter was used.       Prior to Admission medications  Medication Sig Start Date End Date Taking? Authorizing Provider  amLODipine  (NORVASC ) 10 MG tablet Take 1 tablet (10 mg total) by mouth daily. 09/12/23   Briana Elgin LABOR, MD  atorvastatin  (LIPITOR ) 40 MG tablet Take 1 tablet (40 mg total) by mouth daily. 06/11/23   Addie Perkins, DO  budesonide -formoterol  (SYMBICORT ) 160-4.5 MCG/ACT inhaler Inhale 2 puffs into the lungs 3 times/day as needed-between meals & bedtime. 07/27/22   Leopold Perkins NOVAK, MD  carvedilol  (COREG ) 12.5 MG tablet Take 1 tablet (12.5 mg total) by mouth 2 (two) times daily with a meal. 12/03/23   [name] Sharper, MD  cyclobenzaprine  (FLEXERIL ) 10 MG tablet Take 1 tablet (10 mg total) by mouth 2 (two) times daily as needed for muscle spasms. 08/09/23   Vernetta Lonni GRADE, MD  diclofenac  Sodium (VOLTAREN  ARTHRITIS PAIN) 1 % GEL Apply 2 g topically 4 (four) times daily. 12/09/23   Elicia Sharper, DO  EPINEPHrine  0.3 mg/0.3 mL IJ SOAJ injection Inject 0.3 mg into the muscle as needed for anaphylaxis.  12/02/23   [name] Sharper, MD  feeding supplement (ENSURE PLUS HIGH PROTEIN) LIQD Take 237 mLs by mouth 2 (two) times daily between meals. 09/12/23   Briana Elgin LABOR, MD  gabapentin  (NEURONTIN ) 300 MG capsule TAKE 1 CAPSULE(300 MG) BY MOUTH THREE TIMES DAILY 11/18/23   [name], DO  hydrALAZINE  (APRESOLINE ) 50 MG tablet Take 100 mg by mouth in the morning and at bedtime. 10/22/23   [provider]  nitroGLYCERIN  (NITROSTAT ) 0.4 MG SL tablet Place 1 tablet (0.4 mg total) under the tongue every 5 (five) minutes as needed for chest pain. 07/04/23   [name], DO  ondansetron  (ZOFRAN ) 4 MG tablet Take 1 tablet (4 mg total) by mouth every 8 (eight) hours as needed for nausea or vomiting. 01/10/24   Almousa, Siham, MD  oxyCODONE -acetaminophen  (PERCOCET) 10-325 MG tablet Take 1 tablet by mouth every 12 (twelve) hours as needed. 02/06/24   [name] Sharper, MD  pantoprazole  (PROTONIX ) 40 MG tablet Take 1 tablet (40 mg total) by mouth daily. 12/02/23   [name] Sharper, MD  rivaroxaban  (XARELTO ) 20 MG TABS tablet TAKE ONE TABLET BY MOUTH DAILY WITH SUPPER 06/11/23   Addie Perkins, DO  sertraline  (ZOLOFT ) 100 MG tablet Take 1 tablet (100 mg total) by mouth daily. 04/19/23 04/18/24  Gregary Sharper, MD  torsemide  (DEMADEX ) 20 MG tablet Take 1 tablet (20 mg total) by mouth daily. 08/15/23   [name], DO  Allergies: Ace inhibitors, Angiotensin receptor blockers, Other, Regadenoson , Shellfish allergy, Peanut oil, Hydrocodone -acetaminophen , Hydromorphone  hcl, Oxycodone -acetaminophen , and Propoxyphene n-acetaminophen     Review of Systems  All other systems reviewed and are negative.   Updated Vital Signs BP (!) 175/97 (BP Location: Left Arm)   Pulse 68   Temp 97.6 F (36.4 C) (Oral)   Resp 18   SpO2 100%   Physical Exam Vitals and nursing note reviewed.  Constitutional:      Appearance: She is well-developed.  HENT:     Head: Normocephalic.  Cardiovascular:     Rate and  Rhythm: Normal rate.  Pulmonary:     Effort: Pulmonary effort is normal.  Abdominal:     General: There is no distension.  Musculoskeletal:        General: Normal range of motion.     Cervical back: Normal range of motion.  Skin:    General: Skin is warm.  Neurological:     General: No focal deficit present.     Mental Status: She is alert and oriented to person, place, and time.     (all labs ordered are listed, but only abnormal results are displayed) Labs Reviewed  BASIC METABOLIC PANEL WITH GFR - Abnormal; Notable for the following components:      Result Value   BUN 26 (*)    Creatinine, Ser 1.66 (*)    GFR, Estimated 33 (*)    All other components within normal limits  CBC - Abnormal; Notable for the following components:   RBC 3.28 (*)    Hemoglobin 9.9 (*)    HCT 31.3 (*)    All other components within normal limits  PRO BRAIN NATRIURETIC PEPTIDE - Abnormal; Notable for the following components:   Pro Brain Natriuretic Peptide 1,620.0 (*)    All other components within normal limits  TROPONIN T, HIGH SENSITIVITY  TROPONIN T, HIGH SENSITIVITY    EKG: EKG Interpretation Date/Time:  Wednesday February 26 2024 12:05:11 EST Ventricular Rate:  70 PR Interval:  158 QRS Duration:  64 QT Interval:  394 QTC Calculation: 425 R Axis:   16  Text Interpretation: Normal sinus rhythm Normal ECG Confirmed by Bernard Drivers ([id number]) on 02/26/2024 1:34:34 PM  Radiology: [facility] Chest 2 View Result Date: 02/26/2024 EXAM: 2 VIEW(S) XRAY OF THE CHEST 02/26/2024 11:50:00 AM COMPARISON: 01/10/2024 CLINICAL HISTORY: SOB FINDINGS: LUNGS AND PLEURA: Low lung volumes. No focal pulmonary opacity. No pleural effusion. No pneumothorax. HEART AND MEDIASTINUM: Cardiomegaly. BONES AND SOFT TISSUES: Thoracic degenerative changes. IMPRESSION: 1. Cardiomegaly. 2. Low lung volumes. Electronically signed by: Waddell Calk MD 02/26/2024 12:53 PM EST RP Workstation: [id number]     Procedures    Medications Ordered in the ED  furosemide  (LASIX ) injection 20 mg (20 mg Intravenous Given 02/26/24 1404)                                    Medical Decision Making Pt complains of swelling to her legs and shortness of breat.  Pt went to the medicine clinic today and was sent here for evaluation for admission for swelling.   Amount and/or Complexity of Data Reviewed Labs: ordered. Decision-making details documented in ED Course.    Details: BNP is 1630.   Radiology: ordered and independent interpretation performed. Decision-making details documented in ED Course.    Details: Chest xray shows Cardiomegaly   Risk Prescription drug management. Risk Details: I spoke with  the internal medicine resident on-call who will see the patient here for admission.  Patient is given IV Lasix  20 mg.        Final diagnoses:  Congestive heart failure, unspecified HF chronicity, unspecified heart failure type [facility])  Peripheral edema    ED Discharge Orders     None          Flint Sonny POUR, PA-C 02/26/24 1437    Flint Sonny POUR, PA-C 02/26/24 1501  "

## 2024-02-26 NOTE — Progress Notes (Addendum)
 "  CC: Acute visit  HPI: [name] is a 73 y.o. female living with a history stated below and presents today for acute visit. Please see problem based assessment and plan for additional details.   Past Medical History:  Diagnosis Date   Acute renal failure superimposed on stage 3b chronic kidney disease, unspecified acute renal failure type (HCC) 09/09/2023   ALCOHOL ABUSE 12/13/2005   Annotation: Sober since 11/06 Qualifier: Diagnosis of  By: Elnor MD, Comer     Anemia    Arthritis    all over (02/21/2017)   CAD (coronary artery disease)    s/p non-Q wave MI in 06/2001 and 2004, 2005   CHF (congestive heart failure) (HCC)    Chronic kidney disease (CKD), stage III (moderate) (HCC) 09/19/2010   Chronic mid back pain    Depression    DJD (degenerative joint disease)    DVT (deep venous thrombosis) (HCC)    BLE   DVT of lower extremity, bilateral (HCC) 04/04/2012   On Xarelto      GERD (gastroesophageal reflux disease)    Gout    History of alcohol abuse 12/13/2005   Annotation: Sober since 11/06           Hyperlipidemia    Hypertension    INTRINSIC ASTHMA, WITH EXACERBATION 09/27/2009   Qualifier: Diagnosis of  By: Danella MD, Nodira     Migraine    none anymore (02/21/2017)   Myocardial infarction [facility]) ?2005   Obesity    OSA (obstructive sleep apnea)    previously used CPAP, has lost it (02/21/2017)   Pressure injury of skin 09/09/2023   Rhabdomyolysis 09/09/2023   Seizures (HCC)    As a teenager    Traumatic rhabdomyolysis 09/09/2023    Medications Ordered Prior to Encounter[1]  Family History  Problem Relation Age of Onset   Mental illness Mother    Heart disease Father    Diabetes Sister    Heart disease Sister    Diabetes Sister    Mental illness Sister    Heart disease Brother    Hypertension Daughter    Heart disease Daughter     Social History   Socioeconomic History   Marital status: Widowed    Spouse name: Not on file    Number of children: 3   Years of education: GED   Highest education level: Not on file  Occupational History   Occupation: retired    Associate Professor: UNEMPLOYED    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   Drug use: No   Sexual activity: Not on file  Other Topics Concern   Not on file  Social History Narrative   Current Social History 02/01/2020        Patient lives alone in a/an apartment which is 1 story. There are not steps up to the entrance the patient uses.       Patient's method of transportation is via family member and the city bus.      The highest level of education was GED school diploma.      The patient currently disabled.      Identified  important Relationships are Granddaughter       Pets : None       Interests / Fun: I like to Crochet       Current Stressors: My best friend passed away in 25-Dec-2024       Social Drivers of Health   Tobacco Use: Low Risk (12/09/2023)   Patient History    Smoking Tobacco Use: Never    Smokeless Tobacco Use: Never    Passive Exposure: Not on file  Financial Resource Strain: Low Risk (11/13/2023)   Overall Financial Resource Strain (CARDIA)    Difficulty of Paying Living Expenses: Not hard at all  Food Insecurity: No Food Insecurity (11/13/2023)   Epic    Worried About Programme Researcher, Broadcasting/film/video in the Last Year: Never true    Ran Out of Food in the Last Year: Never true  Transportation Needs: No Transportation Needs (11/13/2023)   Epic    Lack of Transportation (Medical): No    Lack of Transportation (Non-Medical): No  Physical Activity: Inactive (11/13/2023)   Exercise Vital Sign    Days of Exercise per Week: 0 days    Minutes of  Exercise per Session: 0 min  Stress: No Stress Concern Present (11/13/2023)   [employer] of Occupational Health - Occupational Stress Questionnaire    Feeling of Stress: Only a little  Social Connections: Moderately Integrated (11/13/2023)   Social Connection and Isolation Panel    Frequency of Communication with Friends and Family: More than three times a week    Frequency of Social Gatherings with Friends and Family: More than three times a week    Attends Religious Services: More than 4 times per year    Active Member of Clubs or Organizations: Yes    Attends Banker Meetings: More than 4 times per year    Marital Status: Divorced  Intimate Partner Violence: Not At Risk (11/13/2023)   Epic    Fear of Current or Ex-Partner: No    Emotionally Abused: No    Physically Abused: No    Sexually Abused: No  Depression (PHQ2-9): Low Risk (02/26/2024)   Depression (PHQ2-9)    PHQ-2 Score: 1  Alcohol Screen: Low Risk (07/27/2022)   Alcohol Screen    Last Alcohol Screening Score (AUDIT): 0  Housing: Low Risk (11/13/2023)   Epic    Unable to Pay for Housing in the Last Year: No    Number of Times Moved in the Last Year: 0    Homeless in the Last Year: No  Utilities: Not At Risk (11/13/2023)   Epic    Threatened with loss of utilities: No  Health Literacy: Adequate Health Literacy (11/13/2023)   B1300 Health Literacy    Frequency of need for help with medical instructions: Rarely    Review of Systems: ROS negative except for what is noted on the assessment and plan.  Vitals:   02/26/24 0940 02/26/24 1020  BP: (!) 158/95 (!) 170/98  Pulse: 75 73  Temp: 97.7 F (36.5 C)   TempSrc: Oral   SpO2: 97%   Height: 5' 2 (1.575 m)    Physical Exam: Constitutional: alert, sitting in wheelchair, in mild acute distress Cardiovascular: regular rate and rhythm Pulmonary/Chest: normal work of breathing on room air, lungs clear bilaterally MSK: (see photo below) 3+ pitting BLE  edema up to thighs b/l, chronic lymphedema but worsening w/ areas that appear to be weeping, TTP but no surrounding erythema or purulent drainage Neurological: awake and alert  Assessment & Plan:   Assessment & Plan Leg swelling Lymphedema Chronic heart failure with preserved ejection fraction (HCC) Hx of HTN, HFpEF (EF 60-65%), CKD 3B, chronic back pain w/ chronic opioid use. Acute visit for b/l leg swelling that has progressive worsen over the past 2-3 weeks. Tender and tight up to her thighs bilaterally. Difficulty ambulating compared to baseline. Some SOB and occasional chest pain w/ hx of chronic stable angina. Hx of HFpEF but echo from years ago. She lives alone at apartment. Granddaughter comes occasionally and has aide that helps w/ chores. Not been able to sleep in bed due to its height so sleeping in her chair for past weeks.   Given significant swelling bilaterally up to thighs and living alone, recommend ED evaluation, repeat echo and IV diuresis.   Patient agrees w/ ED evaluation and transport by EMS. Discussed w/ patient and pt's granddaughter.        Return for Schedule appointment after ER evaluation.   Patient seen with Dr. Machen  Ahlijah Raia, D.O. [facility], PGY-3 Clinic Phone: [phone] Date 02/26/2024 Time 10:24 AM     [1]  Current Outpatient Medications on File Prior to Visit  Medication Sig Dispense Refill   amLODipine  (NORVASC ) 10 MG tablet Take 1 tablet (10 mg total) by mouth daily.     atorvastatin  (LIPITOR ) 40 MG tablet Take 1 tablet (40 mg total) by mouth daily. 90 tablet 3   budesonide -formoterol  (SYMBICORT ) 160-4.5 MCG/ACT inhaler Inhale 2 puffs into the lungs 3 times/day as needed-between meals & bedtime. 1 each 12   carvedilol  (COREG ) 12.5 MG tablet Take 1 tablet (12.5 mg total) by mouth 2 (two) times daily with a meal. 180 tablet 3   cyclobenzaprine  (FLEXERIL ) 10 MG tablet Take 1 tablet (10 mg total) by mouth 2 (two)  times daily as needed for muscle spasms. 30 tablet 0   diclofenac  Sodium (VOLTAREN  ARTHRITIS PAIN) 1 % GEL Apply 2 g topically 4 (four) times daily. 150 g 1   EPINEPHrine  0.3 mg/0.3 mL IJ SOAJ injection Inject 0.3 mg into the muscle as needed for anaphylaxis. 1 each 3   feeding supplement (ENSURE PLUS HIGH PROTEIN) LIQD Take 237 mLs by mouth 2 (two) times daily between meals.     gabapentin  (NEURONTIN ) 300 MG capsule TAKE 1 CAPSULE(300 MG) BY MOUTH THREE TIMES DAILY 270 capsule 1   hydrALAZINE  (APRESOLINE ) 50 MG tablet Take 100 mg by mouth in the morning and at bedtime.     nitroGLYCERIN  (NITROSTAT ) 0.4 MG SL tablet Place 1 tablet (0.4 mg total) under the tongue every 5 (five) minutes as needed for chest pain. 30 tablet 1   ondansetron  (ZOFRAN ) 4 MG tablet Take 1 tablet (4 mg total) by mouth every 8 (eight) hours as needed for nausea or vomiting. 12 tablet 0   oxyCODONE -acetaminophen  (PERCOCET) 10-325 MG tablet Take 1 tablet by mouth every 12 (twelve) hours as needed. 60 tablet 0   pantoprazole  (PROTONIX ) 40 MG tablet Take 1 tablet (40 mg total) by mouth daily. 90 tablet 3   rivaroxaban  (XARELTO ) 20 MG TABS tablet TAKE ONE TABLET BY MOUTH DAILY WITH SUPPER 90 tablet 3   sertraline  (ZOLOFT ) 100 MG tablet Take 1 tablet (100 mg total) by mouth daily. 90 tablet 3   torsemide  (DEMADEX ) 20 MG tablet Take 1 tablet (20 mg total) by mouth daily. 90 tablet 3   No current facility-administered medications on file prior to visit.   "

## 2024-02-26 NOTE — ED Triage Notes (Signed)
 Patient BIB [facility] from Dr's office for leg swelling x 3 weeks, all other VS normal. Patient c/o pain to touch. Difficulty with ambulation recently causing dr's visit.

## 2024-02-26 NOTE — Patient Instructions (Addendum)
 Thank you, [name] for allowing us  to provide your care today. Today we discussed:  -Recommend going to the emergency room for severe leg swelling in both legs up to the thighs      Follow up: After ER evaluation    Should you have any questions or concerns please call the internal medicine clinic at [phone].    Curtis Uriarte, D.O. [facility]

## 2024-02-26 NOTE — ED Triage Notes (Signed)
 Patient reports compliance with torsemide  and that she is using the bathroom but the fluid is still staying in her legs. She also endorses exertional SHOB.

## 2024-02-26 NOTE — Hospital Course (Addendum)
 Unna boot  Last LDL 69 in November 2025   Legal gaudrian is on vacation  Update:   Phone number: Gwen phone #: [phone]

## 2024-02-27 ENCOUNTER — Inpatient Hospital Stay (HOSPITAL_COMMUNITY)

## 2024-02-27 DIAGNOSIS — N1832 Chronic kidney disease, stage 3b: Secondary | ICD-10-CM

## 2024-02-27 DIAGNOSIS — I5033 Acute on chronic diastolic (congestive) heart failure: Secondary | ICD-10-CM | POA: Diagnosis not present

## 2024-02-27 DIAGNOSIS — I130 Hypertensive heart and chronic kidney disease with heart failure and stage 1 through stage 4 chronic kidney disease, or unspecified chronic kidney disease: Secondary | ICD-10-CM

## 2024-02-27 DIAGNOSIS — D631 Anemia in chronic kidney disease: Secondary | ICD-10-CM | POA: Diagnosis not present

## 2024-02-27 DIAGNOSIS — Z86718 Personal history of other venous thrombosis and embolism: Secondary | ICD-10-CM | POA: Diagnosis not present

## 2024-02-27 LAB — CBC
HCT: 30.4 % — ABNORMAL LOW (ref 36.0–46.0)
Hemoglobin: 9.8 g/dL — ABNORMAL LOW (ref 12.0–15.0)
MCH: 30.1 pg (ref 26.0–34.0)
MCHC: 32.2 g/dL (ref 30.0–36.0)
MCV: 93.3 fL (ref 80.0–100.0)
Platelets: 201 K/uL (ref 150–400)
RBC: 3.26 MIL/uL — ABNORMAL LOW (ref 3.87–5.11)
RDW: 14.6 % (ref 11.5–15.5)
WBC: 5.9 K/uL (ref 4.0–10.5)
nRBC: 0 % (ref 0.0–0.2)

## 2024-02-27 LAB — ECHOCARDIOGRAM COMPLETE
Area-P 1/2: 5.13 cm2
Height: 62 in
S' Lateral: 2.8 cm
Weight: 3904 [oz_av]

## 2024-02-27 LAB — BASIC METABOLIC PANEL WITH GFR
Anion gap: 12 (ref 5–15)
BUN: 25 mg/dL — ABNORMAL HIGH (ref 8–23)
CO2: 24 mmol/L (ref 22–32)
Calcium: 9 mg/dL (ref 8.9–10.3)
Chloride: 101 mmol/L (ref 98–111)
Creatinine, Ser: 1.71 mg/dL — ABNORMAL HIGH (ref 0.44–1.00)
GFR, Estimated: 31 mL/min — ABNORMAL LOW
Glucose, Bld: 110 mg/dL — ABNORMAL HIGH (ref 70–99)
Potassium: 4.3 mmol/L (ref 3.5–5.1)
Sodium: 137 mmol/L (ref 135–145)

## 2024-02-27 LAB — HEPATIC FUNCTION PANEL
ALT: 7 U/L (ref 0–44)
AST: 24 U/L (ref 15–41)
Albumin: 4 g/dL (ref 3.5–5.0)
Alkaline Phosphatase: 125 U/L (ref 38–126)
Bilirubin, Direct: 0.4 mg/dL — ABNORMAL HIGH (ref 0.0–0.2)
Indirect Bilirubin: 0.6 mg/dL (ref 0.3–0.9)
Total Bilirubin: 1 mg/dL (ref 0.0–1.2)
Total Protein: 7.7 g/dL (ref 6.5–8.1)

## 2024-02-27 LAB — IRON AND TIBC
Iron: 37 ug/dL (ref 28–170)
Saturation Ratios: 15 % (ref 10.4–31.8)
TIBC: 245 ug/dL — ABNORMAL LOW (ref 250–450)
UIBC: 208 ug/dL

## 2024-02-27 LAB — FERRITIN: Ferritin: 133 ng/mL (ref 11–307)

## 2024-02-27 LAB — MRSA NEXT GEN BY PCR, NASAL: MRSA by PCR Next Gen: NOT DETECTED

## 2024-02-27 LAB — MAGNESIUM: Magnesium: 1.5 mg/dL — ABNORMAL LOW (ref 1.7–2.4)

## 2024-02-27 MED ORDER — FUROSEMIDE 10 MG/ML IJ SOLN
40.0000 mg | Freq: Two times a day (BID) | INTRAMUSCULAR | Status: DC
  Administered 2024-02-27: 40 mg via INTRAVENOUS
  Filled 2024-02-27: qty 4

## 2024-02-27 MED ORDER — FENTANYL CITRATE (PF) 50 MCG/ML IJ SOSY: 12.5000 ug | PREFILLED_SYRINGE | INTRAMUSCULAR | Status: DC | PRN

## 2024-02-27 MED ORDER — MAGNESIUM SULFATE 2 GM/50ML IV SOLN
2.0000 g | Freq: Once | INTRAVENOUS | Status: AC
  Administered 2024-02-27: 2 g via INTRAVENOUS
  Filled 2024-02-27: qty 50

## 2024-02-27 MED ORDER — CARVEDILOL 12.5 MG PO TABS
12.5000 mg | ORAL_TABLET | Freq: Two times a day (BID) | ORAL | Status: DC
  Administered 2024-02-27 – 2024-02-28 (×3): 12.5 mg via ORAL
  Filled 2024-02-27 (×3): qty 1

## 2024-02-27 MED ORDER — FUROSEMIDE 10 MG/ML IJ SOLN: 80.0000 mg | Freq: Two times a day (BID) | INTRAMUSCULAR | Status: DC

## 2024-02-27 NOTE — Progress Notes (Signed)
 "  HD#1 SUBJECTIVE:  Patient Summary: Vanessa Palmer is a 74 y.o. with a pertinent PMH of HFpEF (60-65% 2021), CKD4, chronic pain, DVT in 2024 who presented with leg swelling and admitted for acute on chronic HFE.   Overnight Events: none  Interim History: Pain is much better this morning and her breathing has improved enough for her to sleep lying back at an angle. She's been peeing a lot and denies dysuria. Discussed plan to continue medicine to make her pee, continue pain control and get a picture of her heart.   OBJECTIVE:  Vital Signs: Vitals:   02/26/24 2145 02/26/24 2147 02/27/24 0620 02/27/24 0623  BP: (!) 169/102  (!) 155/78   Pulse: 86  77   Resp: 18  16   Temp:  98.1 F (36.7 C)  98.1 F (36.7 C)  TempSrc:  Oral  Oral  SpO2: 100%  100%   Weight:       Supplemental O2: Room Air SpO2: 100 %  Filed Weights   02/26/24 1537  Weight: 110.7 kg     Intake/Output Summary (Last 24 hours) at 02/27/2024 0912 Last data filed at 02/27/2024 0630 Gross per 24 hour  Intake --  Output 2600 ml  Net -2600 ml   Net IO Since Admission: -2,600 mL [02/27/24 0912]  Physical Exam: Physical Exam Vitals reviewed.  Constitutional:      General: She is not in acute distress.    Appearance: Normal appearance.     Comments: Resting in bed, lying back at around 45 degrees breathing comfortably on room air at 96%  HENT:     Nose: Nose normal.     Mouth/Throat:     Mouth: Mucous membranes are moist.     Pharynx: Oropharynx is clear.  Eyes:     Conjunctiva/sclera: Conjunctivae normal.  Cardiovascular:     Rate and Rhythm: Normal rate and regular rhythm.     Heart sounds: Murmur heard.     Comments: BL feet warm to touch, BL + DPs Pulmonary:     Effort: Pulmonary effort is normal.  Abdominal:     General: Bowel sounds are normal.     Palpations: Abdomen is soft.  Musculoskeletal:     Right lower leg: Edema present.     Left lower leg: Edema present.     Comments: TTP to BL LE   Skin:    Comments: Chronic woody induration BL LE  Neurological:     General: No focal deficit present.     Mental Status: She is oriented to person, place, and time.     Sensory: No sensory deficit.  Psychiatric:        Mood and Affect: Mood normal.        Behavior: Behavior normal.     Patient Lines/Drains/Airways Status     Active Line/Drains/Airways     Name Placement date Placement time Site Days   Peripheral IV 02/26/24 20 G Left Antecubital 02/26/24  1257  Antecubital  1            Pertinent labs and imaging:  Mg 1.5 CK  99 TSH 1.11 Fe 37, TIBC 245, Ratios 15, Ferritin 133    Latest Ref Rng & Units 02/27/2024    1:04 AM 02/26/2024   12:54 PM 01/10/2024    8:36 PM  CBC  WBC 4.0 - 10.5 K/uL 5.9  4.5    Hemoglobin 12.0 - 15.0 g/dL 9.8  9.9  9.5   Hematocrit 36.0 -  46.0 % 30.4  31.3  28.0   Platelets 150 - 400 K/uL 201  176         Latest Ref Rng & Units 02/27/2024    1:04 AM 02/26/2024   12:54 PM 02/06/2024    3:36 PM  CMP  Glucose 70 - 99 mg/dL 889  94  891   BUN 8 - 23 mg/dL 25  26  25    Creatinine 0.44 - 1.00 mg/dL 8.28  8.33  8.01   Sodium 135 - 145 mmol/L 137  138  140   Potassium 3.5 - 5.1 mmol/L 4.3  4.8  5.0   Chloride 98 - 111 mmol/L 101  104  104   CO2 22 - 32 mmol/L 24  22  22    Calcium  8.9 - 10.3 mg/dL 9.0  9.0  8.7     DG Chest 2 View Result Date: 02/26/2024 EXAM: 2 VIEW(S) XRAY OF THE CHEST 02/26/2024 11:50:00 AM COMPARISON: 01/10/2024 CLINICAL HISTORY: SOB FINDINGS: LUNGS AND PLEURA: Low lung volumes. No focal pulmonary opacity. No pleural effusion. No pneumothorax. HEART AND MEDIASTINUM: Cardiomegaly. BONES AND SOFT TISSUES: Thoracic degenerative changes. IMPRESSION: 1. Cardiomegaly. 2. Low lung volumes. Electronically signed by: Vanessa Calk MD 02/26/2024 12:53 PM EST RP Workstation: [id number]    ASSESSMENT/PLAN:  Assessment: Principal Problem:   Acute on chronic diastolic heart failure (HCC) Active Problems:   Hyperlipidemia    Major depressive disorder, recurrent episode, moderate (HCC)   Essential hypertension   Asthma, chronic   Chronic kidney disease (CKD), stage III (moderate) (HCC)   Urinary incontinence   GERD (gastroesophageal reflux disease)   Normocytic anemia   Lymphedema   OA (osteoarthritis) of bilateral knees   History of DVT (deep vein thrombosis), 2014  Vanessa Palmer is a 74 y.o. person living with a history of HFpEF, LVEF 60-65% 2021, Severe chronic knee and back pain on chronic opioid therapy, CKD stage IV, DVT in 2014  who presented with recommendation from PCP for bilateral lower extremity swelling and admitted for heart failure exacerbation on hospital day 0   HFpEF Exacerbation, LVEF 60-65% 2021 LUSB systolic Murmur  B/L LE edema Lymphedema  History of heart failure with preserved ejection fraction, last echo in 06/2019 LVEF 60 to 65%, without evidence of aortic stenosis with moderate left ventricular hypertrophy and grade 1 diastolic dysfunction. Presenting weight 110.7 kg. Home medications include carvedilol  12.5 mg twice daily, hydralazine  50 mg twice daily, torsemide  20 mg daily and nitroglycerin  as needed. proBNP 1600 on admission, troponins flat, EKG consistent with normal sinus. TSH and CK wnl. Ferritin 133 and saturation ratio 15, consider giving IV Iron if her echo shows reduced EF. Exacerbation could be due to recent medication changes, new or worsening aortic stenosis, hepatic dysfunction, anemia or diet changes given the recent holiday season. UOP 2.6L overnight, will do an additional 40 mg IV Lasix  today. Mg 1.5 today and replenished IV 2 g. Pain is much better today and 96% on room air while lying back at around 45 degrees. Echo in process.  Plan: -Strict ins and outs -Lasix  IV 40 this AM -K>4, Mag>2 -Fu echo -Unna boots ordered -Follow-up hepatic function panel  -Daily standing weights    CKD Stage 3B Normocytic anemia GFR 33 and Cr 1.66 on admission. GFR ranges from lower  30s- mid 20s. Cr baseline appears to be around 1.7-1.9. Stable.  -Daily BMP  HTN On a home regimen of hydralazine  50 mg BID, torsemide  20 mg daily, Coreg  12.5  mg BID, and amlodipine  10 mg. Will hold carvedilol  in the setting of heart failure exacerbation. Restart home amlodipine  this AM, consider SGLT2i or alternative HTN medication given worsening chronic BL LE swelling  - Hold home carvedilol  12.5 twice daily  - Hydralazine  50 mg 3 times daily - home amlodipine  10 mg daily   OA of BL knees on chronic opioid therapy  Home regimen includes gabapentin  300 mg 3 times daily and oxycodone -acetaminophen  10-325 mg every 12 hours as needed -Continue home Percocet 10-325 mg Q12 hours  -Continue gabapentin  300 mg 3 times daily -PT/OT ordered    H/o DVT 2014 -Continue home Xarelto  of 20 mg daily    Urinary Incontinence -Purewick while diuresing    GERD -Continue Protonix  40 mg   Asthma  No concern for exacerbation at this time. -Continue Symbicort  equivalent: breo ellipta one puff daily    MDD -Continue Zoloft  100 mg daily    HLD -Last LDL was 69 in November 2025, continue Lipitor  40 mg daily  Best Practice: Diet: Cardiac diet VTE: rivaroxaban  (XARELTO ) tablet 20 mg Start: 02/26/24 2029 Code: DNR/DNI  Disposition planning: Therapy Recs: Pending,  Family Contact: Caregiver Gwen, updated on admission DISPO: Anticipated discharge tomorrow to Home pending IV Diuresis.  Signature:  Viktoria Charmayne [facility]  9:12 AM, 02/27/2024  On Call pager [phone]  "

## 2024-02-27 NOTE — Progress Notes (Signed)
 Echocardiogram 2D Echocardiogram has been performed.  Vanessa Palmer 02/27/2024, 9:39 AM

## 2024-02-27 NOTE — Evaluation (Signed)
 Physical Therapy Evaluation Patient Details Name: Vanessa Palmer MRN: [id number] DOB: 07/25/1950 Today's Date: 02/27/2024  History of Present Illness  Pt is a 74 y/o female presenting 12/31 to [facility] ED for leg swelling, sent via PCP office originally at insistence of her HHPT.  Work up includes Acute on Chronic dHF.  PMHx:  HFpEF, severe chronic knee and back pain, CKD4, HTN, MDD, Gout, R THA.  Clinical Impression  Pt admitted with/for dHF with B LE swelling and SOB.  Pt's mobility significantly limited at present time needing mod assist for bed mobility and basic transfers.  Pt took shuffled steps with difficulty and generally limited functionally.  Reports she is often functional at a W/C level with significant assist given by care givers.  Pt currently limited functionally due to the problems listed. ( See problems list.)   Pt will benefit from PT to maximize function and safety in order to get ready for next venue listed below.  Patient will benefit from continued inpatient follow up therapy, <3 hours/day, but expect will refuse in favor of d/c home with adult day care in place, PCA every day and HHPT to continue.          If plan is discharge home, recommend the following: A lot of help with walking and/or transfers;A lot of help with bathing/dressing/bathroom;Assistance with cooking/housework   Can travel by private vehicle   No    Equipment Recommendations None recommended by PT (TBD)  Recommendations for Other Services  Rehab consult    Functional Status Assessment Patient has had a recent decline in their functional status and demonstrates the ability to make significant improvements in function in a reasonable and predictable amount of time.     Precautions / Restrictions Precautions Precautions: Fall Recall of Precautions/Restrictions: Intact      Mobility  Bed Mobility Overal bed mobility: Needs Assistance Bed Mobility: Supine to Sit, Sit to Supine     Supine to  sit: Contact guard, HOB elevated, Min assist Sit to supine: Max assist (at bil LE's)   General bed mobility comments: pt transitioned to edge of stretcher with HOB elevated and extra time.  minimal stability assist egress, max at bil LE for ingress.    Transfers Overall transfer level: Needs assistance Equipment used: Rollator (4 wheels) Transfers: Sit to/from Stand Sit to Stand: Mod assist           General transfer comment: cues and assist for hand placement and to get R>L LE into adequate knee flexion.  Then light mod boost and forward assist.    Ambulation/Gait           Gait velocity interpretation: <1.31 ft/sec, indicative of household ambulator   General Gait Details: labored side stepping 1 foot up toward HOB with ~4 mini-steps bil. Limited w/shift and shuffled steps to the Right with rollator support  Stairs            Wheelchair Mobility     Tilt Bed    Modified Rankin (Stroke Patients Only)       Balance Overall balance assessment: Needs assistance Sitting-balance support: Bilateral upper extremity supported, No upper extremity supported Sitting balance-Leahy Scale: Fair Sitting balance - Comments: limited amount of time without UE assist   Standing balance support: Bilateral upper extremity supported, During functional activity, Reliant on assistive device for balance Standing balance-Leahy Scale: Poor Standing balance comment: reliant on AD and/or external support.  Pertinent Vitals/Pain Pain Assessment Pain Assessment: Faces Faces Pain Scale: Hurts little more Pain Location: bil LE Pain Descriptors / Indicators: Discomfort, Grimacing, Sore Pain Intervention(s): Limited activity within patient's tolerance, Monitored during session    Home Living Family/patient expects to be discharged to:: Private residence Living Arrangements: Alone Available Help at Discharge: Family;Friend(s);Available  PRN/intermittently Type of Home: Apartment Home Access: Elevator       Home Layout: One level Home Equipment: Agricultural Consultant (2 wheels);Cane - single point;Tub bench Additional Comments: PCA assists with bath    Prior Function Prior Level of Function : Needs assist             Mobility Comments: transfers to w/c  ? power vs manual ADLs Comments: PCA assists with bath and meals,     Extremity/Trunk Assessment   Upper Extremity Assessment Upper Extremity Assessment: Generalized weakness    Lower Extremity Assessment Lower Extremity Assessment: Generalized weakness       Communication   Communication Communication: Impaired Factors Affecting Communication: Reduced clarity of speech    Cognition Arousal: Lethargic (alert at end of session) Behavior During Therapy: Flat affect   PT - Cognitive impairments: Difficult to assess (does not have a)                       PT - Cognition Comments: lower medical insight. Following commands: Intact       Cueing Cueing Techniques: Verbal cues     General Comments General comments (skin integrity, edema, etc.): VSS, sats on RA >=mid 90's,  HR in the 100's bpm with minimal mobility    Exercises     Assessment/Plan    PT Assessment Patient needs continued PT services  PT Problem List Decreased strength;Decreased range of motion;Decreased activity tolerance;Decreased mobility;Decreased knowledge of use of DME;Cardiopulmonary status limiting activity;Pain;Decreased balance       PT Treatment Interventions DME instruction;Gait training;Functional mobility training;Therapeutic activities;Balance training;Patient/family education    PT Goals (Current goals can be found in the Care Plan section)  Acute Rehab PT Goals Patient Stated Goal: get back home with Leg swelling reduced and pt at mod I level. PT Goal Formulation: With patient Time For Goal Achievement: 03/12/24 Potential to Achieve Goals: Fair     Frequency Min 3X/week     Co-evaluation PT/OT/SLP Co-Evaluation/Treatment:  (for bed mobility and transfers otherwise +2)             AM-PAC PT 6 Clicks Mobility  Outcome Measure Help needed turning from your back to your side while in a flat bed without using bedrails?: A Little Help needed moving from lying on your back to sitting on the side of a flat bed without using bedrails?: A Little Help needed moving to and from a bed to a chair (including a wheelchair)?: A Lot Help needed standing up from a chair using your arms (e.g., wheelchair or bedside chair)?: A Lot Help needed to walk in hospital room?: A Lot Help needed climbing 3-5 steps with a railing? : A Lot 6 Click Score: 14    End of Session   Activity Tolerance: Patient limited by fatigue;Patient limited by lethargy Patient left: in bed;with call bell/phone within reach Nurse Communication: Mobility status PT Visit Diagnosis: Unsteadiness on feet (R26.81);Other abnormalities of gait and mobility (R26.89);Muscle weakness (generalized) (M62.81);Pain Pain - Right/Left:  (bil) Pain - part of body: Leg    Time: 1140-1217 PT Time Calculation (min) (ACUTE ONLY): 37 min   Charges:   PT  Evaluation $PT Eval Moderate Complexity: 1 Mod PT Treatments $Therapeutic Activity: 8-22 mins PT General Charges $$ ACUTE PT VISIT: 1 Visit         02/27/2024  India HERO., PT Acute Rehabilitation Services [phone]  (office)  Vinie GAILS Devyon Keator 02/27/2024, 12:51 PM

## 2024-02-28 ENCOUNTER — Other Ambulatory Visit: Payer: Self-pay | Admitting: Student

## 2024-02-28 ENCOUNTER — Other Ambulatory Visit: Payer: Self-pay

## 2024-02-28 ENCOUNTER — Other Ambulatory Visit (HOSPITAL_COMMUNITY): Payer: Self-pay

## 2024-02-28 DIAGNOSIS — I5033 Acute on chronic diastolic (congestive) heart failure: Secondary | ICD-10-CM | POA: Diagnosis not present

## 2024-02-28 LAB — CBC
HCT: 26 % — ABNORMAL LOW (ref 36.0–46.0)
Hemoglobin: 8.5 g/dL — ABNORMAL LOW (ref 12.0–15.0)
MCH: 30.7 pg (ref 26.0–34.0)
MCHC: 32.7 g/dL (ref 30.0–36.0)
MCV: 93.9 fL (ref 80.0–100.0)
Platelets: 162 K/uL (ref 150–400)
RBC: 2.77 MIL/uL — ABNORMAL LOW (ref 3.87–5.11)
RDW: 14.6 % (ref 11.5–15.5)
WBC: 4.5 K/uL (ref 4.0–10.5)
nRBC: 0 % (ref 0.0–0.2)

## 2024-02-28 LAB — MAGNESIUM: Magnesium: 2.1 mg/dL (ref 1.7–2.4)

## 2024-02-28 LAB — BASIC METABOLIC PANEL WITH GFR
Anion gap: 7 (ref 5–15)
BUN: 28 mg/dL — ABNORMAL HIGH (ref 8–23)
CO2: 28 mmol/L (ref 22–32)
Calcium: 8.3 mg/dL — ABNORMAL LOW (ref 8.9–10.3)
Chloride: 100 mmol/L (ref 98–111)
Creatinine, Ser: 2.3 mg/dL — ABNORMAL HIGH (ref 0.44–1.00)
GFR, Estimated: 22 mL/min — ABNORMAL LOW
Glucose, Bld: 113 mg/dL — ABNORMAL HIGH (ref 70–99)
Potassium: 4.5 mmol/L (ref 3.5–5.1)
Sodium: 135 mmol/L (ref 135–145)

## 2024-02-28 MED ORDER — SERTRALINE HCL 100 MG PO TABS: 100.0000 mg | ORAL_TABLET | Freq: Every day | ORAL | 3 refills | Status: AC

## 2024-02-28 MED ORDER — HYDRALAZINE HCL 50 MG PO TABS
50.0000 mg | ORAL_TABLET | Freq: Three times a day (TID) | ORAL | 0 refills | Status: DC
  Filled 2024-02-28: qty 90, 30d supply, fill #0

## 2024-02-28 MED ORDER — TORSEMIDE 20 MG PO TABS
20.0000 mg | ORAL_TABLET | Freq: Every day | ORAL | Status: DC
  Administered 2024-02-28: 20 mg via ORAL
  Filled 2024-02-28: qty 1

## 2024-02-28 NOTE — TOC Transition Note (Signed)
 Transition of Care [facility]) - Discharge Note   Patient Details  Name: ANNALEESE GUIER MRN: [id number] Date of Birth: [birth year]  Transition of Care [facility]) CM/SW Contact:  Roxie KANDICE Stain, RN Phone Number: 02/28/2024, 2:28 PM   Clinical Narrative:    Patient stable for discharge.  Spoke to granddaughter, Beatrix, regarding patient care at home. Beatrix states patient will have 24hr help at home.  Bed and BSC ordered from [employer].  Notified Amy with [employer] of discharge.  Address, Phone number and PCP verified.    Final next level of care: Home w Home Health Services Barriers to Discharge: Barriers Resolved   Patient Goals and CMS Choice Patient states their goals for this hospitalization and ongoing recovery are:: return home CMS Medicare.gov Compare Post Acute Care list provided to:: Patient Choice offered to / list presented to : Patient      Discharge Placement               home        Discharge Plan and Services Additional resources added to the After Visit Summary for                  DME Arranged: Bedside commode, Hospital bed DME Agency: [employer] Date DME Agency Contacted: 02/28/24 Time DME Agency Contacted: 1129 Representative spoke with at DME Agency: [place] HH Arranged: PT, OT HH Agency: [facility] Date [facility] Agency Contacted: 02/28/24 Time HH Agency Contacted: 1100 Representative spoke with at [facility] Agency: Amy  Social Drivers of Health (SDOH) Interventions SDOH Screenings   Food Insecurity: No Food Insecurity (02/27/2024)  Housing: Low Risk (02/27/2024)  Transportation Needs: No Transportation Needs (02/27/2024)  Utilities: Not At Risk (02/27/2024)  Alcohol Screen: Low Risk (07/27/2022)  Depression (PHQ2-9): Low Risk (02/26/2024)  Financial Resource Strain: Low Risk (11/13/2023)  Physical Activity: Inactive (11/13/2023)  Social Connections: Moderately Integrated (02/27/2024)  Stress: No Stress Concern Present (11/13/2023)  Tobacco Use:  Low Risk (02/26/2024)  Health Literacy: Adequate Health Literacy (11/13/2023)     Readmission Risk Interventions    02/28/2024    2:27 PM 09/10/2023    4:06 PM  Readmission Risk Prevention Plan  Transportation Screening Complete Complete  PCP or Specialist Appt within 5-7 Days  Complete  PCP or Specialist Appt within 3-5 Days Complete   Home Care Screening  Complete  Medication Review (RN CM)  Complete  HRI or Home Care Consult Complete   Social Work Consult for Recovery Care Planning/Counseling Complete   Palliative Care Screening Not Applicable   Medication Review Oceanographer) Complete

## 2024-02-28 NOTE — Progress Notes (Signed)
 PT Cancellation Note  Patient Details Name: Vanessa Palmer MRN: [id number] DOB: October 14, 1950   Cancelled Treatment:    Reason Eval/Treat Not Completed: (P) Patient declined, no reason specified (pt up in chair eating lunch, reports no questions/needs mobility-wise as far as PT session prior to DC, politely defers PT session at this time. Case mgmt arriving to speak with pt after attempt.)   [name] Blue 02/28/2024, 11:40 AM

## 2024-02-28 NOTE — Discharge Summary (Signed)
 "  Name: Vanessa Palmer MRN: [id number] DOB: [birth year] 74 y.o. PCP: [name], DO  Date of Admission: 02/26/2024 11:07 AM Date of Discharge: 02/28/2024 Attending Physician: Dr. Dayton Rosan  Discharge Diagnosis: 1. Principal Problem:   Acute on chronic diastolic heart failure (HCC) Active Problems:   Hyperlipidemia   Major depressive disorder, recurrent episode, moderate (HCC)   Essential hypertension   Asthma, chronic   Chronic kidney disease (CKD), stage III (moderate) (HCC)   Urinary incontinence   GERD (gastroesophageal reflux disease)   Peripheral edema   Normocytic anemia   Lymphedema   OA (osteoarthritis) of bilateral knees   History of DVT (deep vein thrombosis), 2014   Discharge Medications: Allergies as of 02/28/2024       Reactions   Ace Inhibitors Swelling, Other (See Comments)    Angioedema documented to lisinopril, losartan  (2016). Possible reaction to telmisartan  (patient reported eye swelling in Sept 2025  - treated with epi pen, did not go to hospital).    Angiotensin Receptor Blockers Swelling   Other Anaphylaxis, Itching, Swelling   Dragonfruit   Regadenoson  Hives   LEXISCAN    Shellfish Allergy Anaphylaxis   Peanut Oil Itching, Rash, Other (See Comments)   Welts, also   Hydrocodone -acetaminophen  Itching   Hydromorphone  Hcl Rash, Other (See Comments)   Diffuse rash   Oxycodone -acetaminophen  Itching   Tolerates with Benadryl    Propoxyphene N-acetaminophen  Itching        Medication List     TAKE these medications    amLODipine  10 MG tablet Commonly known as: NORVASC  Take 1 tablet (10 mg total) by mouth daily.   atorvastatin  40 MG tablet Commonly known as: LIPITOR  Take 1 tablet (40 mg total) by mouth daily.   budesonide -formoterol  160-4.5 MCG/ACT inhaler Commonly known as: Symbicort  Inhale 2 puffs into the lungs 3 times/day as needed-between meals & bedtime.   carvedilol  12.5 MG tablet Commonly known as: COREG  Take 1 tablet  (12.5 mg total) by mouth 2 (two) times daily with a meal.   cyclobenzaprine  10 MG tablet Commonly known as: FLEXERIL  Take 1 tablet (10 mg total) by mouth 2 (two) times daily as needed for muscle spasms.   diclofenac  Sodium 1 % Gel Commonly known as: Voltaren  Arthritis Pain Apply 2 g topically 4 (four) times daily.   EPINEPHrine  0.3 mg/0.3 mL Soaj injection Commonly known as: EPI-PEN Inject 0.3 mg into the muscle as needed for anaphylaxis.   feeding supplement Liqd Take 237 mLs by mouth 2 (two) times daily between meals.   gabapentin  300 MG capsule Commonly known as: NEURONTIN  TAKE 1 CAPSULE(300 MG) BY MOUTH THREE TIMES DAILY   hydrALAZINE  50 MG tablet Commonly known as: APRESOLINE  Take 1 tablet (50 mg total) by mouth every 8 (eight) hours. What changed:  how much to take when to take this   nitroGLYCERIN  0.4 MG SL tablet Commonly known as: NITROSTAT  Place 1 tablet (0.4 mg total) under the tongue every 5 (five) minutes as needed for chest pain.   ondansetron  4 MG tablet Commonly known as: ZOFRAN  Take 1 tablet (4 mg total) by mouth every 8 (eight) hours as needed for nausea or vomiting.   oxyCODONE -acetaminophen  10-325 MG tablet Commonly known as: PERCOCET Take 1 tablet by mouth every 12 (twelve) hours as needed.   pantoprazole  40 MG tablet Commonly known as: Protonix  Take 1 tablet (40 mg total) by mouth daily.   rivaroxaban  20 MG Tabs tablet Commonly known as: Xarelto  TAKE ONE TABLET BY MOUTH DAILY WITH SUPPER  sertraline  100 MG tablet Commonly known as: Zoloft  Take 1 tablet (100 mg total) by mouth daily.   torsemide  20 MG tablet Commonly known as: DEMADEX  Take 1 tablet (20 mg total) by mouth daily.               Durable Medical Equipment  (From admission, onward)           Start     Ordered   02/28/24 1031  For home use only DME 3 n 1  Once        02/28/24 1030   02/28/24 1029  For home use only DME Hospital bed  Once       Question Answer  Comment  Length of Need Lifetime   Patient has (list medical condition): obesity, lymphedema, HFpEF   The above medical condition requires: Patient requires the ability to reposition frequently   Bed type Semi-electric      02/28/24 1030            Disposition and follow-up:   [name] was discharged from [facility] in Stable condition.  At the hospital follow up visit please address:  1.  Acute on Chronic HFpEF Exacerbation LVEF 60-65% 02/2024 CKDIIIb Symptoms (orthopnea and BL LE pitting edema) improved with diuresis although Cr increased and elevated on discharge. Assess volume status and symptoms of fluid overload.   Lymphedema Spoke with [facility], they do have upcoming availability but need for PCP to place referral. Placed unna boots on day of discharge, please remove at hospital fu appointment. Consider switching amlodipine  given her leg swelling.   2.  Labs / imaging needed at time of follow-up: BMP with GFR to ensure kidney function stabilized  3.  Pending labs/ test needing follow-up: none  Follow-up Appointments:  Contact information for follow-up providers     [name] Bruckner, DO. Go on 03/06/2024.   Specialty: Internal Medicine Why: hospital follow up appointment at 10:45am Contact information: [address], [address] [place] [address] [phone]              Contact information for after-discharge care     Home Medical Care     [facility]) Follow up.   Service: Home Health Services Why: home health has been arranged Contact information: [address] [place]  [phone]                      Hospital Course by problem list: Vanessa Palmer is a 74 y.o. person living with a history of HFpEF, LVEF 60-65% 2026, Severe chronic knee and back pain on chronic opioid therapy, CKD stage IV, DVT in 2014 who presented with recommendation  from PCP for bilateral lower extremity swelling and admitted for heart failure exacerbation now being discharged on hospital day 2 with the following pertinent hospital course:   HFpEF Exacerbation, LVEF 60-65% 02/2024 Lymphedema  ProBNP 1600 on admission, troponins flat, EKG consistent with normal sinus.  UA without signs of infection. History of heart failure with preserved ejection fraction, last echo in 06/2019 LVEF 60 to 65%, without evidence of aortic stenosis with moderate left ventricular hypertrophy and grade 1 diastolic dysfunction.  Patient noted increased bilateral lower extremity swelling as well as increasing shortness of breath over the last several days.  Collateral from caretaker reports patient has seen many doctors recently and has had several medication changes.  Home medications include carvedilol  12.5  mg twice daily, hydralazine  50 mg twice daily, torsemide  20 mg daily and nitroglycerin  as needed.  Denies clear symptoms of URI, medication noncompliance or changes in diet.  Is volume overloaded on exam and has audible left upper sternal border systolic murmur.  Exacerbation could be due to recent medication changes, new or worsening aortic insufficiency, thyroid dysfunction, hepatic dysfunction, anemia or diet changes given the recent holiday season. Hepatic function panel with direct bilirubin 0.4 otherwise wnl. TSH and CK wnl. Ferritin 133 and saturation ratio 15. Repeat echo largely unchanged from prior in 2021. Responded well to diuresis with about 4L UOP throughout hospital admission. Discharge on home Torsemide  20 mg, Carvedilol  12.5 mg BID, and Hydralazine  50 mg TID. Caregiver said her home Hydralazine  was BID. While inpatient, BP was controlled well with Hydralazine  50 mg TID. Will discharge her on TID. Patient discharging in unna boots with plan to have follow up care on 1/9 at PCP office. Spoke with [facility] L[facility]ema clinic, they can likely get her sometime this month. Will have  her PCP on 1/9 place referral to lymphedema clinic.    CKD Stage 3B Normocytic anemia GFR ranges from lower 30s- mid 20s. Cr baseline appears to be around 1.7-1.9.  Electrolytes stable. Admitting Cr 1.66, GFR 33. On discharge, Cr 2.3, GFR 22. CrCl 27.2 on day of discharge. Close outpatient follow up for repeat labs and medication adjusting if necessary.    H/o DVT 2014 Continue home Xarelto  of 20 mg daily    HTN On a home regimen of hydralazine  50 mg BID, torsemide  20 mg daily, Coreg  12.5 mg BID, and amlodipine  10 mg.  Tolerated Hydral 50 mg TID well, will discharge on TID. Outpatient med mgmt considerations include changing amlodipine  given lower extremity edema.    OA of BL knees on chronic opioid therapy  Home regimen includes gabapentin  300 mg 3 times daily and oxycodone -acetaminophen  10-325 mg every 12 hours as needed. PT and OT saw her while inpatient, placed PT OT Home Health orders.    GERD -Continue Protonix  40 mg   Asthma  No concern for exacerbation throughout admission.  -Continue Symbicort  equivalent: breo ellipta one puff daily    MDD -Continue Zoloft  100 mg daily    HLD -Last LDL was 69 in November 2025, continue Lipitor  40 mg daily   Subjective She feels back to normal and feels ready to go home. She's able to sleep in the bed with slight elevation now. Her DOE has improved and her leg pain is much better. Discussed plan for discharge with unna boots and close PCP follow. Patient says I can call Gwen to update her. All questions answered.   Discharge Exam:   BP 113/66 (BP Location: Right Arm)   Pulse 65   Temp 98.4 F (36.9 C) (Oral)   Resp 10   Ht 5' 2 (1.575 m)   Wt 116.7 kg   SpO2 99%   BMI 47.06 kg/m  Discharge exam:  Vitals reviewed.  Constitutional:      Appearance: Normal appearance. She is not ill-appearing.  HENT:     Head: Normocephalic.     Nose: Nose normal.     Mouth/Throat:     Mouth: Mucous membranes are moist.     Pharynx: Oropharynx  is clear.  Eyes:     Conjunctiva/sclera: Conjunctivae normal.  Cardiovascular:     Rate and Rhythm: Normal rate and regular rhythm.  Pulmonary:     Effort: Pulmonary effort is normal.  Breath sounds: Normal breath sounds. No rales.  Abdominal:     Palpations: Abdomen is soft.  Musculoskeletal:     Comments: BL LE pitting edema much improved   Skin:    General: Skin is warm.     Comments: Chronic venous stasis changes and lymphedema  Neurological:     General: No focal deficit present.     Mental Status: She is alert and oriented to person, place, and time.  Psychiatric:        Mood and Affect: Mood normal.        Behavior: Behavior normal.   Pertinent Labs, Studies, and Procedures:     Latest Ref Rng & Units 02/28/2024    2:04 AM 02/27/2024    1:04 AM 02/26/2024   12:54 PM  CBC  WBC 4.0 - 10.5 K/uL 4.5  5.9  4.5   Hemoglobin 12.0 - 15.0 g/dL 8.5  9.8  9.9   Hematocrit 36.0 - 46.0 % 26.0  30.4  31.3   Platelets 150 - 400 K/uL 162  201  176        Latest Ref Rng & Units 02/28/2024    2:04 AM 02/27/2024    1:04 AM 02/26/2024   12:54 PM  CMP  Glucose 70 - 99 mg/dL 886  889  94   BUN 8 - 23 mg/dL 28  25  26    Creatinine 0.44 - 1.00 mg/dL 7.69  8.28  8.33   Sodium 135 - 145 mmol/L 135  137  138   Potassium 3.5 - 5.1 mmol/L 4.5  4.3  4.8   Chloride 98 - 111 mmol/L 100  101  104   CO2 22 - 32 mmol/L 28  24  22    Calcium  8.9 - 10.3 mg/dL 8.3  9.0  9.0   Total Protein 6.5 - 8.1 g/dL  7.7    Total Bilirubin 0.0 - 1.2 mg/dL  1.0    Alkaline Phos 38 - 126 U/L  125    AST 15 - 41 U/L  24    ALT 0 - 44 U/L  7      ECHOCARDIOGRAM COMPLETE Result Date: 02/27/2024    ECHOCARDIOGRAM REPORT   Patient Name:   PAULETTA PICKNEY Blanchfield Date of Exam: 02/27/2024 Medical Rec #:  [id number]       Height:       62.0 in Accession #:    [id number]      Weight:       244.0 lb Date of Birth:  January 02, 1951      BSA:          2.080 m Patient Age:    73 years        BP:           139/83 mmHg Patient Gender: F                HR:           80 bpm. Exam Location:  Inpatient Procedure: 2D Echo, Cardiac Doppler and Color Doppler (Both Spectral and Color            Flow Doppler were utilized during procedure). Indications:    Congestive Heart Failure  History:        Patient has prior history of Echocardiogram examinations, most                 recent 07/20/2019. CHF, Signs/Symptoms:Edema; Risk  Factors:Hypertension, Dyslipidemia and Sleep Apnea.  Sonographer:    Juliene Rucks Referring Phys: [phone] KEVIN STEINL  Sonographer Comments: Technically difficult study due to poor echo windows and patient is obese. IMPRESSIONS  1. Left ventricular ejection fraction, by estimation, is 60 to 65%. The left ventricle has normal function. The left ventricle has no regional wall motion abnormalities. There is mild left ventricular hypertrophy. Left ventricular diastolic parameters were normal.  2. Right ventricular systolic function is normal. The right ventricular size is not well visualized. Mildly increased right ventricular wall thickness.  3. Left atrial size was severely dilated.  4. The mitral valve is degenerative. No evidence of mitral valve regurgitation. No evidence of mitral stenosis.  5. The aortic valve has an indeterminant number of cusps. Aortic valve regurgitation is mild. Aortic valve sclerosis is present, with no evidence of aortic valve stenosis.  6. Aortic dilatation noted. There is borderline dilatation of the aortic root, measuring 37 mm. There is mild dilatation of the ascending aorta, measuring 38 mm. Comparison(s): A prior study was performed on 07/20/2019. There was grade I diastolic dysfunction. Otherwise no significant change. FINDINGS  Left Ventricle: Left ventricular ejection fraction, by estimation, is 60 to 65%. The left ventricle has normal function. The left ventricle has no regional wall motion abnormalities. The left ventricular internal cavity size was normal in size. There is  mild left  ventricular hypertrophy. Left ventricular diastolic parameters were normal. Right Ventricle: The right ventricular size is not well visualized. Mildly increased right ventricular wall thickness. Right ventricular systolic function is normal. Left Atrium: Left atrial size was severely dilated. Right Atrium: Right atrial size was not well visualized. Pericardium: There is no evidence of pericardial effusion. Mitral Valve: The mitral valve is degenerative in appearance. There is mild thickening of the mitral valve leaflet(s). Mild mitral annular calcification. No evidence of mitral valve regurgitation. No evidence of mitral valve stenosis. Tricuspid Valve: The tricuspid valve is normal in structure. Tricuspid valve regurgitation is mild . No evidence of tricuspid stenosis. Aortic Valve: The aortic valve has an indeterminant number of cusps. Aortic valve regurgitation is mild. Aortic valve sclerosis is present, with no evidence of aortic valve stenosis. Pulmonic Valve: The pulmonic valve was normal in structure. Pulmonic valve regurgitation is trivial. No evidence of pulmonic stenosis. Aorta: Aortic dilatation noted. There is borderline dilatation of the aortic root, measuring 37 mm. There is mild dilatation of the ascending aorta, measuring 38 mm. Venous: The inferior vena cava was not well visualized. IAS/Shunts: The interatrial septum was not well visualized.  LEFT VENTRICLE PLAX 2D LVIDd:         4.50 cm   Diastology LVIDs:         2.80 cm   LV e' medial:    8.92 cm/s LV PW:         1.10 cm   LV E/e' medial:  11.5 LV IVS:        1.20 cm   LV e' lateral:   11.40 cm/s LVOT diam:     2.00 cm   LV E/e' lateral: 9.0 LV SV:         64 LV SV Index:   31 LVOT Area:     3.14 cm  LEFT ATRIUM           Index LA diam:      3.50 cm 1.68 cm/m LA Vol (A4C): 86.9 ml 41.77 ml/m  AORTIC VALVE LVOT Vmax:   101.00 cm/s LVOT Vmean:  64.600  cm/s LVOT VTI:    0.203 m  AORTA Ao Root diam: 3.70 cm Ao Asc diam:  3.80 cm MITRAL VALVE MV  Area (PHT): 5.13 cm     SHUNTS MV Decel Time: 148 msec     Systemic VTI:  0.20 m MV E velocity: 103.00 cm/s  Systemic Diam: 2.00 cm MV A velocity: 78.10 cm/s MV E/A ratio:  1.32 Emeline Calender Electronically signed by Emeline Calender Signature Date/Time: 02/27/2024/10:46:41 AM    Final    DG Chest 2 View Result Date: 02/26/2024 EXAM: 2 VIEW(S) XRAY OF THE CHEST 02/26/2024 11:50:00 AM COMPARISON: 01/10/2024 CLINICAL HISTORY: SOB FINDINGS: LUNGS AND PLEURA: Low lung volumes. No focal pulmonary opacity. No pleural effusion. No pneumothorax. HEART AND MEDIASTINUM: Cardiomegaly. BONES AND SOFT TISSUES: Thoracic degenerative changes. IMPRESSION: 1. Cardiomegaly. 2. Low lung volumes. Electronically signed by: Waddell Calk MD 02/26/2024 12:53 PM EST RP Workstation: [id number]     Discharge Instructions: Discharge Instructions     (HEART FAILURE PATIENTS) Call MD:  Anytime you have any of the following symptoms: 1) 3 pound weight gain in 24 hours or 5 pounds in 1 week 2) shortness of breath, with or without a dry hacking cough 3) swelling in the hands, feet or stomach 4) if you have to sleep on extra pillows at night in order to breathe.   Complete by: As directed    Diet - low sodium heart healthy   Complete by: As directed    Discharge instructions   Complete by: As directed    Thank you for allowing us  to be part of your care. You were hospitalized for swelling in your legs due to your heart disease. We treated you with medicines to help your body rid of the excess fluid.   See the changes in your medications and management of your chronic conditions below:  *For your heart disease and leg swelling -We have only made one change in your medications, please take Hydralazine  50 mg three times daily. You were previously taking it twice a day but we recommend that you take it three times daily.  -Please keep your feet elevated as much as possible, wear the unna boots (or compression stockings), and limit salt /  sugar intake.   FOLLOW UP APPOINTMENTS: We arranged for you to follow up with your family doctor at: 03/06/2024 at 10:45 am  You should also be contacted to arrange delivery of your hospital bed.  Please call your PCP or our clinic if you have any questions or concerns, we may be able to help and keep you from a long and expensive emergency room wait. Our clinic and after hours phone number is [phone]. The best time to call is Monday through Friday 9 am to 4 pm but there is always someone available 24/7 if you have an emergency. If you need medication refills please notify your pharmacy one week in advance and they will send us  a request.   We are glad you are feeling better,  Viktoria King Internal Medicine Inpatient Teaching Service at [facility]   Increase activity slowly   Complete by: As directed        Signed: King Viktoria, DO 02/28/2024, 11:39 AM     "

## 2024-02-28 NOTE — Care Management Important Message (Signed)
 Important Message  Patient Details  Name: Vanessa Palmer MRN: [id number] Date of Birth: [birth year]   Important Message Given:  Yes - Medicare IM     Claretta Deed 02/28/2024, 3:05 PM

## 2024-02-28 NOTE — Progress Notes (Signed)
" °  Progress Note   Date: 02/28/2024  Patient Name: Vanessa Palmer        MRN#: [id number]  Review the patients clinical findings supports the diagnosis of:   Obesity class III      "

## 2024-02-28 NOTE — Discharge Instructions (Addendum)
 Thank you for allowing us  to be part of your care. You were hospitalized for swelling in your legs due to your heart disease. We treated you with medicines to help your body rid of the excess fluid.   See the changes in your medications and management of your chronic conditions below:  *For your heart disease and leg swelling -We have only made one change in your medications, please take Hydralazine  50 mg three times daily. You were previously taking it twice a day but we recommend that you take it three times daily.  -Please keep your feet elevated as much as possible, wear the unna boots (or compression stockings), and limit salt / sugar intake.   FOLLOW UP APPOINTMENTS: We arranged for you to follow up with your family doctor at: 03/06/2024 at 10:45 am  You should also be contacted to arrange delivery of your hospital bed and set up Home Health therapy services.  Please call your PCP or our clinic if you have any questions or concerns, we may be able to help and keep you from a long and expensive emergency room wait. Our clinic and after hours phone number is [phone]. The best time to call is Monday through Friday 9 am to 4 pm but there is always someone available 24/7 if you have an emergency. If you need medication refills please notify your pharmacy one week in advance and they will send us  a request.   We are glad you are feeling better,  Viktoria King Internal Medicine Inpatient Teaching Service at [facility]

## 2024-02-28 NOTE — Progress Notes (Signed)
" ° °  Durable Medical Equipment (From admission, onward)        Start     Ordered  02/28/24 1031  For home use only DME 3 n 1  Once       02/28/24 1030  02/28/24 1029  For home use only DME Hospital bed  Once      Question Answer Comment Length of Need Lifetime  Patient has (list medical condition): obesity, lymphedema, HFpEF  The above medical condition requires: Patient requires the ability to reposition frequently  Bed type Semi-electric    02/28/24 1030        "

## 2024-02-28 NOTE — Progress Notes (Signed)
 Orthopedic Tech Progress Note Patient Details:  Vanessa Palmer 1950-07-31 [id number]  Ortho Devices Type of Ortho Device: Radio broadcast assistant Ortho Device/Splint Location: BLE Ortho Device/Splint Interventions: Ordered, Application   Post Interventions Patient Tolerated: Well  [name] 02/28/2024, 9:35 AM

## 2024-02-28 NOTE — Progress Notes (Signed)
 Heart Failure Navigator Progress Note  Assessed for Heart & Vascular TOC clinic readiness.  Patient does not meet criteria due to EF 60-65%, has a scheduled Internal Medicine follow up appointment on 03/06/2024. No HF TOC. .   Navigator will sign off at this time.   Stephane Haddock, BSN, Scientist, Clinical (histocompatibility And Immunogenetics) Only

## 2024-02-28 NOTE — Evaluation (Addendum)
 Occupational Therapy Evaluation Patient Details Name: Vanessa Palmer MRN: [id number] DOB: [birth year] Today's Date: 02/28/2024   History of Present Illness   Pt is a 74 y/o female presenting 12/31 to [facility] ED for leg swelling, sent via PCP office originally at insistence of her HHPT.  Pt with  HFpEF Exacerbation. PMHx:  HFpEF, severe chronic knee and back pain, CKD4, HTN, MDD, Gout, R THA.     Clinical Impressions PTA pt lives alone, has a PCA 3-4 days/wk, 2 hrs/day who assists with bathing and IADL tasks. Pt is involved in a recovery outpt program that she attends 4 days/wk and is very proud that she has been clean for 14 months. At baseline, [place] uses either her rollator or wc for mobility and SCAT for transportation. Pt requires increased time for mobility due to body habitus and knee pain, but has her way of getting things done. Pt overall modified independent with bed mobility with elevated HOB and CGA with transfers. Pt requires min A with ADL tasks however could be modified independent with use of AE. Recommend DC home with HHOT and DME listed below. Acute OT to follow to facilitate safe DC home.  Pt on 2L on entry - unable to achieve accurate pleth. No apparent DOE with activity.      If plan is discharge home, recommend the following:   A little help with bathing/dressing/bathroom;Assistance with cooking/housework;Assist for transportation     Functional Status Assessment   Patient has had a recent decline in their functional status and demonstrates the ability to make significant improvements in function in a reasonable and predictable amount of time.     Equipment Recommendations   BSC/3in1;Hospital bed     Recommendations for Other Services         Precautions/Restrictions   Precautions Precautions: Fall Recall of Precautions/Restrictions: Intact Restrictions Weight Bearing Restrictions Per Provider Order: No     Mobility Bed Mobility Overal bed  mobility: Modified Independent         Sit to supine:  (at bil LE's)   General bed mobility comments: HOB elevated    Transfers Overall transfer level: Needs assistance Equipment used: Rollator (4 wheels) Transfers: Sit to/from Stand Sit to Stand: Contact guard assist                  Balance Overall balance assessment: Needs assistance Sitting-balance support: Bilateral upper extremity supported, No upper extremity supported Sitting balance-Leahy Scale: Good Sitting balance - Comments: limited amount of time without UE assist   Standing balance support: Bilateral upper extremity supported, During functional activity, Reliant on assistive device for balance Standing balance-Leahy Scale: Poor                             ADL either performed or assessed with clinical judgement   ADL Overall ADL's : Needs assistance/impaired Eating/Feeding: Independent   Grooming: Set up   Upper Body Bathing: Set up   Lower Body Bathing: Minimal assistance;Sit to/from stand Would benefit from long handled sponge   Upper Body Dressing : Minimal assistance;Sitting   Lower Body Dressing: Minimal assistance;Sit to/from stand Able to donn socks with set up using a wide sock aid   Toilet Transfer: Contact guard assist;Stand-pivot   Toileting- Clothing Manipulation and Hygiene: Minimal assistance  Hygiene is difficult  - may benefit from  toilet aid     Functional mobility during ADLs: Contact guard assist;Rollator (4 wheels) General ADL Comments:  modified independent with wide sock-aid. May benefit from dressign stick for UB ADL     Vision         Perception         Praxis         Pertinent Vitals/Pain Pain Assessment Pain Assessment: Faces Faces Pain Scale: Hurts little more Pain Location: bil LE Pain Descriptors / Indicators: Discomfort, Grimacing, Sore Pain Intervention(s): Limited activity within patient's tolerance     Extremity/Trunk Assessment  Upper Extremity Assessment Upper Extremity Assessment: Generalized weakness (B shoulder limitations; LUE worse than R)   Lower Extremity Assessment Lower Extremity Assessment: Defer to PT evaluation (hx of B knee pain)   Cervical / Trunk Assessment Cervical / Trunk Assessment: Kyphotic;Other exceptions (increased body habitus)   Communication Communication Communication: No apparent difficulties   Cognition Arousal: Alert (alert at end of session) Behavior During Therapy: [facility] for tasks assessed/performed Cognition: No apparent impairments (most likely at baseline)                               Following commands: Intact       Cueing  General Comments   Cueing Techniques: Verbal cues  unable to achieve good pleth; pt on 2L; no apparent DOE   Exercises     Shoulder Instructions      Home Living Family/patient expects to be discharged to:: Private residence Living Arrangements: Alone Available Help at Discharge: Family;Friend(s);Available PRN/intermittently;Personal care attendant (PCA 2 hrs/day 5 days/week) Type of Home: Apartment Home Access: Elevator     Home Layout: One level     Bathroom Shower/Tub: Chief Strategy Officer: Standard Bathroom Accessibility:  (barely via w/c per pt.)   Home Equipment: Rolling Walker (2 wheels);Cane - single point;Tub bench;Wheelchair - manual   Additional Comments: PCA assists with bath, dressing at times; IADL tasks      Prior Functioning/Environment Prior Level of Function : Needs assist             Mobility Comments: transfers to w/c; uses rollator; furniture walks into bathroom; takes SCAT to group meetings ADLs Comments: PCA assists with bath and meals, cleaning    OT Problem List: Decreased strength;Decreased activity tolerance;Decreased range of motion;Impaired balance (sitting and/or standing);Decreased safety awareness;Decreased knowledge of use of DME or AE;Cardiopulmonary status  limiting activity;Obesity;Pain;Impaired UE functional use   OT Treatment/Interventions: Self-care/ADL training;Therapeutic exercise;Energy conservation;DME and/or AE instruction;Therapeutic activities;Patient/family education;Balance training      OT Goals(Current goals can be found in the care plan section)   Acute Rehab OT Goals Patient Stated Goal: home OT Goal Formulation: With patient Time For Goal Achievement: 03/13/24 Potential to Achieve Goals: Good   OT Frequency:  Min 2X/week    Co-evaluation              AM-PAC OT 6 Clicks Daily Activity     Outcome Measure Help from another person eating meals?: None Help from another person taking care of personal grooming?: A Little Help from another person toileting, which includes using toliet, bedpan, or urinal?: A Little Help from another person bathing (including washing, rinsing, drying)?: A Little Help from another person to put on and taking off regular upper body clothing?: A Little Help from another person to put on and taking off regular lower body clothing?: A Little 6 Click Score: 19   End of Session Equipment Utilized During Treatment: Gait belt;Rollator (4 wheels)  Activity Tolerance: Patient tolerated treatment well  Patient left: in chair;with call bell/phone within reach  OT Visit Diagnosis: Unsteadiness on feet (R26.81);Other abnormalities of gait and mobility (R26.89);Muscle weakness (generalized) (M62.81);Pain Pain - Right/Left: Right Pain - part of body: Knee                Time: 0805-0850 OT Time Calculation (min): 45 min Charges:  OT General Charges $OT Visit: 1 Visit OT Evaluation $OT Eval Moderate Complexity: 1 Mod OT Treatments $Self Care/Home Management : 23-37 mins  Kreg Sink, OT/L   Acute OT Clinical Specialist Acute Rehabilitation Services Pager [phone] Office [phone]   [facility] 02/28/2024, 9:04 AM

## 2024-03-02 ENCOUNTER — Telehealth: Payer: Self-pay

## 2024-03-02 NOTE — Transitions of Care (Post Inpatient/ED Visit) (Signed)
" ° °  03/02/2024  Name: Vanessa Palmer MRN: [id number] DOB: [birth year]  Today's TOC FU Call Status: Today's TOC FU Call Status:: Unsuccessful Call (1st Attempt) Unsuccessful Call (1st Attempt) Date: 03/02/24  Attempted to reach the patient regarding the most recent Inpatient/ED visit.  Follow Up Plan: Additional outreach attempts will be made to reach the patient to complete the Transitions of Care (Post Inpatient/ED visit) call.   Medford Balboa, BSN, RN [place]  VBCI - Population Health RN Care Manager [phone]  "

## 2024-03-03 ENCOUNTER — Telehealth: Payer: Self-pay

## 2024-03-03 ENCOUNTER — Ambulatory Visit: Payer: Self-pay

## 2024-03-03 DIAGNOSIS — M5451 Vertebrogenic low back pain: Secondary | ICD-10-CM

## 2024-03-03 DIAGNOSIS — M170 Bilateral primary osteoarthritis of knee: Secondary | ICD-10-CM

## 2024-03-03 MED ORDER — OXYCODONE-ACETAMINOPHEN 10-325 MG PO TABS: 1.0000 | ORAL_TABLET | Freq: Two times a day (BID) | ORAL | 0 refills | Status: DC | PRN

## 2024-03-03 NOTE — Telephone Encounter (Signed)
 Seen 12/31 in clinic

## 2024-03-03 NOTE — Telephone Encounter (Signed)
 Copied from CRM [id number]. Topic: Clinical - Medication Refill >> Mar 03, 2024  1:27 PM Suzette B wrote: Medication: oxyCODONE -acetaminophen  (PERCOCET) 10-325 MG tablet  Has the patient contacted their pharmacy? No But she is aware she has to call in due to the nature of the medication     This is the patient's preferred pharmacy:  [id number] - [place], [place] - [address] AT [facility] OF [address] & [address] [facility] [address] Phone: [phone] Fax: [phone]    Is this the correct pharmacy for this prescription? Yes If no, delete pharmacy and type the correct one.   Has the prescription been filled recently? Yes  Is the patient out of the medication? No-few days remaining  Has the patient been seen for an appointment in the last year OR does the patient have an upcoming appointment? Yes 03/06/24  Can we respond through MyChart? No  Agent: Please be advised that Rx refills may take up to 3 business days. We ask that you follow-up with your pharmacy.

## 2024-03-03 NOTE — Telephone Encounter (Signed)
 Refill due for 1/10 given last fill date however since that it on a Saturday will order it for 1/9

## 2024-03-03 NOTE — Transitions of Care (Post Inpatient/ED Visit) (Signed)
 "  03/03/2024  Name: Vanessa Palmer MRN: [id number] DOB: 22-Sep-1950  Today's TOC FU Call Status: Today's TOC FU Call Status:: Successful TOC FU Call Completed TOC FU Call Complete Date: 03/03/24  Patient's Name and Date of Birth confirmed. Name, DOB  Transition Care Management Follow-up Telephone Call Date of Discharge: 02/28/24 Discharge Facility: [facility]) Type of Discharge: Inpatient Admission Primary Inpatient Discharge Diagnosis:: CHF How have you been since you were released from the hospital?: Better Any questions or concerns?: No  Items Reviewed: Did you receive and understand the discharge instructions provided?: Yes Medications obtained,verified, and reconciled?: Yes (Medications Reviewed) Any new allergies since your discharge?: No Dietary orders reviewed?: Yes Type of Diet Ordered:: Low Sodium Heart Healthy Do you have support at home?: Yes People in Home [RPT]: alone Name of Support/Comfort Primary Source: She has paid caregivers and her granddaughter Beatrix is her support  Medications Reviewed Today: Medications Reviewed Today     Reviewed by Moises Reusing, RN (Case Manager) on 03/03/24 at 1452  Med List Status: <None>   Medication Order Taking? Sig Documenting Provider Last Dose Status Informant  amLODipine  (NORVASC ) 10 MG tablet [id number]  Take 1 tablet (10 mg total) by mouth daily. Briana Elgin LABOR, MD  Active   atorvastatin  (LIPITOR ) 40 MG tablet [id number]  Take 1 tablet (40 mg total) by mouth daily. Addie Perkins, DO  Active Pharmacy Records           Med Note JACKOLYN WADDELL VEAR [place] Feb 27, 2024  9:27 AM)    budesonide -formoterol  (SYMBICORT ) 160-4.5 MCG/ACT inhaler [id number]  Inhale 2 puffs into the lungs 3 times/day as needed-between meals & bedtime. Leopold Perkins NOVAK, MD  Active Pharmacy Records           Med Note [place], [place] R   Fri Feb 28, 2024 10:01 AM) No evidence of being filled in past 12 months  carvedilol  (COREG ) 12.5 MG tablet  [id number]  Take 1 tablet (12.5 mg total) by mouth 2 (two) times daily with a meal. [name] Sharper, MD  Active   cyclobenzaprine  (FLEXERIL ) 10 MG tablet [id number]  Take 1 tablet (10 mg total) by mouth 2 (two) times daily as needed for muscle spasms. Vernetta Lonni GRADE, MD  Active Pharmacy Records           Med Note JACKOLYN WADDELL VEAR [place] Feb 27, 2024  9:27 AM)    diclofenac  Sodium (VOLTAREN  ARTHRITIS PAIN) 1 % GEL [id number]  Apply 2 g topically 4 (four) times daily. Elicia Sharper, DO  Active   EPINEPHrine  0.3 mg/0.3 mL IJ SOAJ injection [id number]  Inject 0.3 mg into the muscle as needed for anaphylaxis. [name] Sharper, MD  Active   feeding supplement (ENSURE PLUS HIGH PROTEIN) LIQD [id number]  Take 237 mLs by mouth 2 (two) times daily between meals. Briana Elgin LABOR, MD  Active   gabapentin  (NEURONTIN ) 300 MG capsule [id number]  TAKE 1 CAPSULE(300 MG) BY MOUTH THREE TIMES DAILY [name], DO  Active   hydrALAZINE  (APRESOLINE ) 50 MG tablet [id number]  Take 1 tablet (50 mg total) by mouth every 8 (eight) hours. Charmayne Holmes, DO  Active   nitroGLYCERIN  (NITROSTAT ) 0.4 MG SL tablet [id number]  Place 1 tablet (0.4 mg total) under the tongue every 5 (five) minutes as needed for chest pain. [name], DO  Active Pharmacy Records  ondansetron  (ZOFRAN ) 4 MG tablet [id number]  Take 1 tablet (4 mg total) by mouth every 8 (eight)  hours as needed for nausea or vomiting. Almousa, Siham, MD  Active   oxyCODONE -acetaminophen  (PERCOCET) 10-325 MG tablet [id number]  Take 1 tablet by mouth every 12 (twelve) hours as needed. [name] Sharper, MD  Active   pantoprazole  (PROTONIX ) 40 MG tablet [id number]  Take 1 tablet (40 mg total) by mouth daily. [name] Sharper, MD  Active   rivaroxaban  (XARELTO ) 20 MG TABS tablet [id number]  TAKE ONE TABLET BY MOUTH DAILY WITH SUPPER Addie Perkins, DO  Active Pharmacy Records           Med Note [place], [place]   Fri Feb 28, 2024 10:02 AM) LF 10/25/23 for 90 tabs   sertraline  (ZOLOFT ) 100 MG tablet [id number]  Take 1 tablet (100 mg total) by mouth daily. [name], DO  Active   torsemide  (DEMADEX ) 20 MG tablet [id number]  Take 1 tablet (20 mg total) by mouth daily. [name], DO  Active Pharmacy Records           Med Note JACKOLYN WADDELL VEAR [place] Feb 27, 2024  9:27 AM)              Home Care and Equipment/Supplies: Were Home Health Services Ordered?: Yes Name of Home Health Agency:: [phone] Has Agency set up a time to come to your home?: No EMR reviewed for Home Health Orders: Orders present/patient has not received call (refer to CM for follow-up) (CM contacted the agency for [facility] to be scheduled) Any new equipment or medical supplies ordered?: Yes Name of Medical supply agency?: [employer] Were you able to get the equipment/medical supplies?: No (A hospital bed was ordered but the patient has to take down the bed that she is in in order for the bed to be delivered)  Functional Questionnaire: Do you need assistance with bathing/showering or dressing?: Yes (She has an aide that assists) Do you need assistance with meal preparation?: Yes (She has an aide that assists) Do you need assistance with eating?: No Do you have difficulty maintaining continence: Yes (The patient wears depends) Do you need assistance with getting out of bed/getting out of a chair/moving?: Yes (She has an aide that assists) Do you have difficulty managing or taking your medications?: No  Follow up appointments reviewed: PCP Follow-up appointment confirmed?: Yes Date of PCP follow-up appointment?: 03/06/24 Follow-up Provider: Lonni [name] Driscilla Lionel Follow-up appointment confirmed?: NA Do you need transportation to your follow-up appointment?: No Do you understand care options if your condition(s) worsen?: Yes-patient verbalized understanding  SDOH Interventions Today    Flowsheet Row Most Recent Value  SDOH Interventions   Food Insecurity  Interventions Intervention Not Indicated  Housing Interventions Intervention Not Indicated  Transportation Interventions Intervention Not Indicated  Utilities Interventions Intervention Not Indicated    Medford Balboa, BSN, RN [place]  VBCI - [facility] RN Care Manager [phone]  "

## 2024-03-06 ENCOUNTER — Ambulatory Visit: Payer: Self-pay | Admitting: Student

## 2024-03-06 ENCOUNTER — Encounter: Payer: Self-pay | Admitting: Student

## 2024-03-06 VITALS — BP 101/67 | HR 74 | Temp 98.6°F | Ht 62.0 in | Wt 256.6 lb

## 2024-03-06 DIAGNOSIS — I890 Lymphedema, not elsewhere classified: Secondary | ICD-10-CM

## 2024-03-06 DIAGNOSIS — D649 Anemia, unspecified: Secondary | ICD-10-CM

## 2024-03-06 DIAGNOSIS — I5032 Chronic diastolic (congestive) heart failure: Secondary | ICD-10-CM

## 2024-03-06 LAB — CBC
HCT: 29.6 % — ABNORMAL LOW (ref 36.0–46.0)
Hemoglobin: 9.5 g/dL — ABNORMAL LOW (ref 12.0–15.0)
MCH: 30.3 pg (ref 26.0–34.0)
MCHC: 32.1 g/dL (ref 30.0–36.0)
MCV: 94.3 fL (ref 80.0–100.0)
Platelets: 207 K/uL (ref 150–400)
RBC: 3.14 MIL/uL — ABNORMAL LOW (ref 3.87–5.11)
RDW: 14.2 % (ref 11.5–15.5)
WBC: 3.5 K/uL — ABNORMAL LOW (ref 4.0–10.5)
nRBC: 0 % (ref 0.0–0.2)

## 2024-03-06 LAB — BASIC METABOLIC PANEL WITH GFR
Anion gap: 20 — ABNORMAL HIGH (ref 5–15)
BUN: 28 mg/dL — ABNORMAL HIGH (ref 8–23)
CO2: 21 mmol/L — ABNORMAL LOW (ref 22–32)
Calcium: 8.7 mg/dL — ABNORMAL LOW (ref 8.9–10.3)
Chloride: 100 mmol/L (ref 98–111)
Creatinine, Ser: 2.37 mg/dL — ABNORMAL HIGH (ref 0.44–1.00)
GFR, Estimated: 21 mL/min — ABNORMAL LOW
Glucose, Bld: 104 mg/dL — ABNORMAL HIGH (ref 70–99)
Potassium: 4.9 mmol/L (ref 3.5–5.1)
Sodium: 140 mmol/L (ref 135–145)

## 2024-03-06 NOTE — Assessment & Plan Note (Signed)
 1 week follow-up after discharge from the hospital.  Today she reports feeling rundown but has no acute concerns.  Her breathing is well and she does not feel that her swelling is worse.  Her weight is stable since discharged to the kilogram.  She is taking all of her medicines as prescribed including 3 blood pressure medicines and her torsemide .  However her blood pressure is too low at 101/67 might be contributing to her tiredness.  She also has underlying anemia. - Check BMP and CBC per hospital records, creatinine increased at discharge and blood counts from chronically low - Stop the hydralazine  which is a 50 3 times daily dose, if not necessary for her. - Continue amlodipine  10 and carvedilol  12.5 twice daily and torsemide  20 daily

## 2024-03-06 NOTE — Patient Instructions (Signed)
 Stop Hydralazine 

## 2024-03-06 NOTE — Progress Notes (Signed)
 "  CC: HFU heart failure  HPI:  Vanessa Palmer is a 74 y.o. female with a PMH stated below who presents today for hospital follow up.  Follow up Hospitalization  Patient was admitted to [facility] on 12/31 and discharged on 1/02. She was treated for acute decompensated congestive heart failure. Treatment for this included diuretics. Telephone follow up was done on 03/03/24 She reports excellent compliance with treatment. She reports this condition is improved.  ----------------------------------------------------------------------------------------- -  Please see problem based assessment and plan for additional details.  Past Medical History:  Diagnosis Date   Acute renal failure superimposed on stage 3b chronic kidney disease, unspecified acute renal failure type (HCC) 09/09/2023   ALCOHOL ABUSE 12/13/2005   Annotation: Sober since 11/06 Qualifier: Diagnosis of  By: Elnor MD, [place]     Anemia    Arthritis    all over (02/21/2017)   CAD (coronary artery disease)    s/p non-Q wave MI in 06/2001 and 2004, 2005   CHF (congestive heart failure) (HCC)    Chronic kidney disease (CKD), stage III (moderate) (HCC) 09/19/2010   Chronic mid back pain    Depression    DJD (degenerative joint disease)    DVT (deep venous thrombosis) (HCC)    BLE   DVT of lower extremity, bilateral (HCC) 04/04/2012   On Xarelto      GERD (gastroesophageal reflux disease)    Gout    History of alcohol abuse 12/13/2005   Annotation: Sober since 11/06           Hyperlipidemia    Hypertension    INTRINSIC ASTHMA, WITH EXACERBATION 09/27/2009   Qualifier: Diagnosis of  By: Danella MD, Nodira     Migraine    none anymore (02/21/2017)   Myocardial infarction [facility]) ?2005   Obesity    OSA (obstructive sleep apnea)    previously used CPAP, has lost it (02/21/2017)   Pressure injury of skin 09/09/2023   Rhabdomyolysis 09/09/2023   Seizures (HCC)    As a teenager    Traumatic rhabdomyolysis  09/09/2023    Review of Systems: ROS negative except for what is noted on the assessment and plan.  Vitals:   03/06/24 1040 03/06/24 1128  BP: 98/64 101/67  Pulse: 73 74  Temp: 98.6 F (37 C)   TempSrc: Oral   SpO2: 96%   Weight: 256 lb 9.6 oz (116.4 kg)   Height: 5' 2 (1.575 m)    Physical Exam: Constitutional: chronically ill-appearing woman in no acute distress. In wheelchair HENT: normocephalic atraumatic, mucous membranes moist Eyes: conjunctiva non-erythematous Cardiovascular: regular rate and rhythm, no m/r/g Pulmonary/Chest: normal work of breathing on room air, lungs clear to auscultation bilaterally Abdominal: soft, non-tender, non-distended MSK: normal bulk and tone Neurological: alert & oriented x 3, no focal deficit Skin: warm and dry Psych: normal mood and behavior  Assessment & Plan:   Patient discussed with Dr. Francesco  Assessment & Plan Chronic heart failure with preserved ejection fraction (HCC) 1 week follow-up after discharge from the hospital.  Today she reports feeling rundown but has no acute concerns.  Her breathing is well and she does not feel that her swelling is worse.  Her weight is stable since discharged to the kilogram.  She is taking all of her medicines as prescribed including 3 blood pressure medicines and her torsemide .  However her blood pressure is too low at 101/67 might be contributing to her tiredness.  She also has underlying anemia. - Check  BMP and CBC per hospital records, creatinine increased at discharge and blood counts from chronically low - Stop the hydralazine  which is a 50 3 times daily dose, if not necessary for her. - Continue amlodipine  10 and carvedilol  12.5 twice daily and torsemide  20 daily Normocytic anemia History of such, hemoglobin was 8.5 on hospital discharge.  There is near her baseline.  Will recheck today Lymphedema Profound to the lower extremities.  Mostly appears to be due to history of her heart failure  as well as obesity.  In [employer] boots today, she will remove them at home. -Refer to lymphedema clinic  I think she has moderate to high risk of rehospitalization unfortunately.  Will ask her to follow-up in 1 month for closer eye.  May be able to stretch her appointments after that.  Lonni Africa, D.O. [facility] Internal Medicine, PGY-2 Phone: [phone] Date 03/06/2024 Time 12:20 PM "

## 2024-03-06 NOTE — Addendum Note (Signed)
 Addended by: ANTONE DWAYNE SAILOR on: 03/06/2024 01:27 PM   Modules accepted: Orders

## 2024-03-06 NOTE — Assessment & Plan Note (Signed)
 Profound to the lower extremities.  Mostly appears to be due to history of her heart failure as well as obesity.  In [employer] boots today, she will remove them at home. -Refer to lymphedema clinic

## 2024-03-06 NOTE — Assessment & Plan Note (Signed)
 History of such, hemoglobin was 8.5 on hospital discharge.  There is near her baseline.  Will recheck today

## 2024-03-07 NOTE — Progress Notes (Signed)
 Internal Medicine Clinic Attending  Case discussed with the resident at the time of the visit.  We reviewed the resident's history and exam and pertinent patient test results.  I agree with the assessment, diagnosis, and plan of care documented in the resident's note.

## 2024-03-10 ENCOUNTER — Ambulatory Visit: Payer: Self-pay | Admitting: Student

## 2024-03-17 ENCOUNTER — Other Ambulatory Visit: Payer: Self-pay | Admitting: Student

## 2024-03-17 DIAGNOSIS — N179 Acute kidney failure, unspecified: Secondary | ICD-10-CM

## 2024-03-17 NOTE — Progress Notes (Signed)
 Placing future orders for BMP and will request this patient present to [facility] for nurse visit blood pressure check this week. Recent hospitalization for heart failure and ongoing elevated creatinine. See my note on 1/09. Further titration of blood pressure medicines and/or lasix  may be required.

## 2024-03-18 NOTE — Telephone Encounter (Unsigned)
 Copied from CRM [id number]. Topic: General - Other >> Mar 18, 2024  9:45 AM Graeme ORN wrote: Reason for CRM: Lonza called from [facility] - sent order 1/12 to be signed and sent back by Dr Rosan. Looks like they were signed and sent back per medica. She says they haven't received anything. Please refax to [phone] Attn: Marval

## 2024-03-19 ENCOUNTER — Telehealth: Payer: Self-pay | Admitting: *Deleted

## 2024-03-19 NOTE — Telephone Encounter (Signed)
 Spoke with Nurse from [facility].  Patient has orders for PT Services that will need to have a doctor signature today. Plan to fax over for doctor to sign.  Informed her that patient's PCP will be in the Clinics this afternoon.

## 2024-03-19 NOTE — Telephone Encounter (Signed)
 done

## 2024-03-25 ENCOUNTER — Telehealth: Payer: Self-pay | Admitting: *Deleted

## 2024-03-25 NOTE — Telephone Encounter (Signed)
 Copied from CRM [id number]. Topic: Clinical - Medical Advice >> Mar 25, 2024  3:02 PM Miquel SAILOR wrote: Reason for CRM: Charisse from [phone]:PT will be Discharged on 01/28 . Charisse still feels she should not due to still having RT knee pain still. 7/10 pain level. Recomended ortho pcp to revaluate [phone]

## 2024-03-27 NOTE — Telephone Encounter (Signed)
 Called pt - stated she sees Dr Vernetta and she will call him.

## 2024-03-27 NOTE — Telephone Encounter (Signed)
 Yes we could offer her a visit with one of the residents for reevaluation, I dont think I have any personal availability in the near future.

## 2024-03-30 ENCOUNTER — Other Ambulatory Visit: Payer: Self-pay

## 2024-03-30 DIAGNOSIS — I5030 Unspecified diastolic (congestive) heart failure: Secondary | ICD-10-CM | POA: Diagnosis not present

## 2024-03-30 DIAGNOSIS — N183 Chronic kidney disease, stage 3 unspecified: Secondary | ICD-10-CM

## 2024-03-30 DIAGNOSIS — I130 Hypertensive heart and chronic kidney disease with heart failure and stage 1 through stage 4 chronic kidney disease, or unspecified chronic kidney disease: Secondary | ICD-10-CM | POA: Diagnosis not present

## 2024-03-30 DIAGNOSIS — E785 Hyperlipidemia, unspecified: Secondary | ICD-10-CM | POA: Diagnosis not present

## 2024-03-30 DIAGNOSIS — I5032 Chronic diastolic (congestive) heart failure: Secondary | ICD-10-CM

## 2024-04-02 ENCOUNTER — Other Ambulatory Visit: Payer: Self-pay | Admitting: *Deleted

## 2024-04-02 DIAGNOSIS — M170 Bilateral primary osteoarthritis of knee: Secondary | ICD-10-CM

## 2024-04-02 DIAGNOSIS — M5451 Vertebrogenic low back pain: Secondary | ICD-10-CM

## 2024-04-02 NOTE — Telephone Encounter (Signed)
 Copied from CRM #[id number]. Topic: Clinical - Medication Refill >> Apr 02, 2024  4:25 PM Susanna ORN wrote: Medication: oxyCODONE -acetaminophen  (PERCOCET) 10-325 MG tablet  Has the patient contacted their pharmacy? No (Agent: If no, request that the patient contact the pharmacy for the refill. If patient does not wish to contact the pharmacy document the reason why and proceed with request.) (Agent: If yes, when and what did the pharmacy advise?)  This is the patient's preferred pharmacy:  [id number] - [place], [place] - [address] AT [facility] [address] [place] [id number] Phone: [phone] Fax: [phone]  Is this the correct pharmacy for this prescription? Yes If no, delete pharmacy and type the correct one.   Has the prescription been filled recently? Yes  Is the patient out of the medication? Yes  Has the patient been seen for an appointment in the last year OR does the patient have an upcoming appointment? Yes  Can we respond through MyChart? Yes  Agent: Please be advised that Rx refills may take up to 3 business days. We ask that you follow-up with your pharmacy.

## 2024-04-02 NOTE — Telephone Encounter (Signed)
 Last rx written - 03/06/24. Last OV - 03/06/24. Next OV - 04/09/24. TOX - 09/04/22.

## 2024-04-03 MED ORDER — OXYCODONE-ACETAMINOPHEN 10-325 MG PO TABS: 1.0000 | ORAL_TABLET | Freq: Two times a day (BID) | ORAL | 0 refills | Status: AC | PRN

## 2024-04-09 ENCOUNTER — Ambulatory Visit: Payer: Self-pay | Admitting: Student

## 2024-04-20 ENCOUNTER — Ambulatory Visit: Admitting: Orthopaedic Surgery

## 2024-04-30 ENCOUNTER — Other Ambulatory Visit: Payer: Self-pay
